# Patient Record
Sex: Female | Born: 1965 | Race: White | Hispanic: No
Health system: Southern US, Academic
[De-identification: ages and names within clinical notes are randomized; demographics above are authoritative.]

## PROBLEM LIST (undated history)

## (undated) ENCOUNTER — Ambulatory Visit

## (undated) ENCOUNTER — Encounter

## (undated) ENCOUNTER — Ambulatory Visit: Payer: Medicaid (Managed Care)

## (undated) ENCOUNTER — Encounter: Attending: Gastroenterology | Primary: Gastroenterology

## (undated) ENCOUNTER — Encounter
Attending: Student in an Organized Health Care Education/Training Program | Primary: Student in an Organized Health Care Education/Training Program

## (undated) ENCOUNTER — Ambulatory Visit: Payer: MEDICAID

## (undated) ENCOUNTER — Encounter: Attending: Podiatrist | Primary: Podiatrist

## (undated) ENCOUNTER — Encounter: Attending: Family | Primary: Family

## (undated) ENCOUNTER — Ambulatory Visit: Attending: Family | Primary: Family

## (undated) ENCOUNTER — Ambulatory Visit: Payer: PRIVATE HEALTH INSURANCE

## (undated) ENCOUNTER — Telehealth: Attending: Family | Primary: Family

## (undated) ENCOUNTER — Encounter: Attending: "Endocrinology | Primary: "Endocrinology

## (undated) ENCOUNTER — Telehealth
Attending: Student in an Organized Health Care Education/Training Program | Primary: Student in an Organized Health Care Education/Training Program

## (undated) ENCOUNTER — Ambulatory Visit: Payer: PRIVATE HEALTH INSURANCE | Attending: "Endocrinology | Primary: "Endocrinology

## (undated) ENCOUNTER — Ambulatory Visit
Payer: PRIVATE HEALTH INSURANCE | Attending: Student in an Organized Health Care Education/Training Program | Primary: Student in an Organized Health Care Education/Training Program

## (undated) ENCOUNTER — Telehealth

## (undated) ENCOUNTER — Encounter: Attending: Internal Medicine | Primary: Internal Medicine

## (undated) ENCOUNTER — Encounter: Attending: Hematology & Oncology | Primary: Hematology & Oncology

## (undated) ENCOUNTER — Ambulatory Visit: Attending: Otolaryngology | Primary: Otolaryngology

## (undated) ENCOUNTER — Ambulatory Visit: Payer: PRIVATE HEALTH INSURANCE | Attending: Family | Primary: Family

## (undated) ENCOUNTER — Ambulatory Visit: Attending: Pharmacist | Primary: Pharmacist

## (undated) ENCOUNTER — Ambulatory Visit: Payer: MEDICAID | Attending: Family | Primary: Family

## (undated) ENCOUNTER — Ambulatory Visit: Payer: PRIVATE HEALTH INSURANCE | Attending: Nurse Practitioner | Primary: Nurse Practitioner

## (undated) ENCOUNTER — Encounter: Attending: Nurse Practitioner | Primary: Nurse Practitioner

## (undated) ENCOUNTER — Other Ambulatory Visit

## (undated) ENCOUNTER — Inpatient Hospital Stay

## (undated) ENCOUNTER — Ambulatory Visit: Payer: PRIVATE HEALTH INSURANCE | Attending: Nephrology | Primary: Nephrology

## (undated) ENCOUNTER — Telehealth: Attending: Nurse Practitioner | Primary: Nurse Practitioner

## (undated) ENCOUNTER — Ambulatory Visit
Payer: Medicaid (Managed Care) | Attending: Student in an Organized Health Care Education/Training Program | Primary: Student in an Organized Health Care Education/Training Program

## (undated) ENCOUNTER — Ambulatory Visit: Payer: MEDICAID | Attending: Registered" | Primary: Registered"

## (undated) ENCOUNTER — Ambulatory Visit: Payer: PRIVATE HEALTH INSURANCE | Attending: Registered" | Primary: Registered"

## (undated) ENCOUNTER — Ambulatory Visit: Attending: Orthopaedic Surgery | Primary: Orthopaedic Surgery

## (undated) ENCOUNTER — Encounter: Attending: Diagnostic Radiology | Primary: Diagnostic Radiology

## (undated) ENCOUNTER — Ambulatory Visit: Payer: Medicaid (Managed Care) | Attending: Nurse Practitioner | Primary: Nurse Practitioner

## (undated) ENCOUNTER — Ambulatory Visit: Payer: MEDICAID | Attending: Otolaryngology | Primary: Otolaryngology

## (undated) ENCOUNTER — Ambulatory Visit: Payer: MEDICAID | Attending: "Endocrinology | Primary: "Endocrinology

## (undated) ENCOUNTER — Inpatient Hospital Stay: Payer: PRIVATE HEALTH INSURANCE

## (undated) ENCOUNTER — Ambulatory Visit: Attending: Podiatrist | Primary: Podiatrist

## (undated) ENCOUNTER — Encounter: Attending: General Acute Care Hospital | Primary: General Acute Care Hospital

## (undated) DIAGNOSIS — D329 Benign neoplasm of meninges, unspecified: Secondary | ICD-10-CM

## (undated) DIAGNOSIS — L409 Psoriasis, unspecified: Secondary | ICD-10-CM

## (undated) DIAGNOSIS — F329 Major depressive disorder, single episode, unspecified: Secondary | ICD-10-CM

## (undated) DIAGNOSIS — N3281 Overactive bladder: Secondary | ICD-10-CM

## (undated) DIAGNOSIS — G8929 Other chronic pain: Secondary | ICD-10-CM

## (undated) DIAGNOSIS — K7460 Unspecified cirrhosis of liver: Secondary | ICD-10-CM

## (undated) DIAGNOSIS — F419 Anxiety disorder, unspecified: Secondary | ICD-10-CM

## (undated) DIAGNOSIS — K219 Gastro-esophageal reflux disease without esophagitis: Secondary | ICD-10-CM

## (undated) DIAGNOSIS — H409 Unspecified glaucoma: Secondary | ICD-10-CM

## (undated) DIAGNOSIS — E119 Type 2 diabetes mellitus without complications: Secondary | ICD-10-CM

## (undated) DIAGNOSIS — I10 Essential (primary) hypertension: Secondary | ICD-10-CM

## (undated) DIAGNOSIS — F32A Depression, unspecified: Secondary | ICD-10-CM

## (undated) DIAGNOSIS — C801 Malignant (primary) neoplasm, unspecified: Secondary | ICD-10-CM

## (undated) DIAGNOSIS — E785 Hyperlipidemia, unspecified: Secondary | ICD-10-CM

## (undated) DIAGNOSIS — M1990 Unspecified osteoarthritis, unspecified site: Secondary | ICD-10-CM

## (undated) DIAGNOSIS — D649 Anemia, unspecified: Secondary | ICD-10-CM

## (undated) HISTORY — DX: Hyperlipidemia, unspecified: E78.5

## (undated) HISTORY — DX: Type 2 diabetes mellitus without complications: E11.9

## (undated) HISTORY — DX: Unspecified osteoarthritis, unspecified site: M19.90

## (undated) HISTORY — DX: Anemia, unspecified: D64.9

## (undated) HISTORY — DX: Psoriasis, unspecified: L40.9

## (undated) HISTORY — DX: Depression, unspecified: F32.A

## (undated) HISTORY — DX: Major depressive disorder, single episode, unspecified: F32.9

## (undated) HISTORY — DX: Benign neoplasm of meninges, unspecified: D32.9

## (undated) HISTORY — DX: Essential (primary) hypertension: I10

## (undated) HISTORY — DX: Anxiety disorder, unspecified: F41.9

## (undated) HISTORY — DX: Unspecified cirrhosis of liver: K74.60

## (undated) HISTORY — DX: Gastro-esophageal reflux disease without esophagitis: K21.9

## (undated) HISTORY — DX: Malignant (primary) neoplasm, unspecified: C80.1

## (undated) HISTORY — DX: Unspecified glaucoma: H40.9

## (undated) HISTORY — DX: Overactive bladder: N32.81

## (undated) HISTORY — DX: Other chronic pain: G89.29

---

## 1998-06-27 ENCOUNTER — Emergency Department (HOSPITAL_COMMUNITY): Admission: EM | Admit: 1998-06-27 | Discharge: 1998-06-27 | Payer: Self-pay | Admitting: Emergency Medicine

## 1998-06-28 ENCOUNTER — Ambulatory Visit (HOSPITAL_COMMUNITY): Admission: RE | Admit: 1998-06-28 | Discharge: 1998-06-28 | Payer: Self-pay | Admitting: Emergency Medicine

## 1998-12-01 HISTORY — PX: CHOLECYSTECTOMY: SHX55

## 2005-12-01 HISTORY — PX: ABDOMINAL HYSTERECTOMY: SHX81

## 2005-12-24 ENCOUNTER — Ambulatory Visit: Payer: Self-pay | Admitting: Unknown Physician Specialty

## 2006-01-08 ENCOUNTER — Ambulatory Visit: Payer: Self-pay | Admitting: Unknown Physician Specialty

## 2007-11-04 ENCOUNTER — Ambulatory Visit: Payer: Self-pay

## 2008-08-10 ENCOUNTER — Ambulatory Visit: Payer: Self-pay | Admitting: Family Medicine

## 2011-01-02 ENCOUNTER — Ambulatory Visit: Payer: Self-pay

## 2011-01-17 ENCOUNTER — Emergency Department: Payer: Self-pay | Admitting: Emergency Medicine

## 2011-03-17 DIAGNOSIS — K625 Hemorrhage of anus and rectum: Secondary | ICD-10-CM | POA: Insufficient documentation

## 2011-06-13 ENCOUNTER — Ambulatory Visit: Payer: Self-pay

## 2012-09-08 ENCOUNTER — Ambulatory Visit: Payer: Self-pay

## 2012-10-04 ENCOUNTER — Ambulatory Visit: Payer: Self-pay | Admitting: Adult Health

## 2012-11-09 ENCOUNTER — Ambulatory Visit: Payer: Self-pay | Admitting: Gynecologic Oncology

## 2012-11-09 LAB — URINALYSIS, COMPLETE
Bilirubin,UR: NEGATIVE
Glucose,UR: NEGATIVE mg/dL (ref 0–75)
Ketone: NEGATIVE
Ph: 5 (ref 4.5–8.0)
Specific Gravity: 1.025 (ref 1.003–1.030)
Squamous Epithelial: 5
Transitional Epi: 1

## 2012-12-01 ENCOUNTER — Ambulatory Visit: Payer: Self-pay | Admitting: Gynecologic Oncology

## 2013-01-29 ENCOUNTER — Ambulatory Visit: Payer: Self-pay | Admitting: Gynecologic Oncology

## 2013-02-14 ENCOUNTER — Ambulatory Visit: Payer: Self-pay

## 2013-02-17 LAB — HM DIABETES EYE EXAM

## 2013-03-01 ENCOUNTER — Ambulatory Visit: Payer: Self-pay | Admitting: Gynecologic Oncology

## 2013-04-07 DIAGNOSIS — M6289 Other specified disorders of muscle: Secondary | ICD-10-CM | POA: Insufficient documentation

## 2013-04-12 ENCOUNTER — Ambulatory Visit: Payer: Self-pay | Admitting: Gynecologic Oncology

## 2013-06-10 ENCOUNTER — Ambulatory Visit: Payer: Self-pay | Admitting: Adult Health

## 2013-12-01 DIAGNOSIS — L409 Psoriasis, unspecified: Secondary | ICD-10-CM

## 2013-12-01 HISTORY — DX: Psoriasis, unspecified: L40.9

## 2013-12-28 ENCOUNTER — Ambulatory Visit: Payer: Self-pay | Admitting: Family Medicine

## 2014-03-01 LAB — HM DIABETES EYE EXAM

## 2014-08-02 LAB — HM DIABETES EYE EXAM

## 2014-08-10 ENCOUNTER — Ambulatory Visit: Payer: Self-pay | Admitting: Internal Medicine

## 2014-08-14 ENCOUNTER — Ambulatory Visit: Payer: Self-pay

## 2014-10-03 ENCOUNTER — Ambulatory Visit: Payer: Self-pay | Admitting: Nurse Practitioner

## 2015-02-07 LAB — HM DIABETES EYE EXAM

## 2015-02-12 ENCOUNTER — Ambulatory Visit: Payer: Self-pay

## 2015-04-06 IMAGING — MG MAM DGTL UNI LT TOMO W/CAD
8 of 10 series · 8 of 18 positions shown · non-contrast
Comparison: Previous examinations.

CLINICAL DATA: Followup left breast probably benign calcifications.

EXAM:
DIGITAL DIAGNOSTIC LEFT MAMMOGRAM WITH 3D TOMOSYNTHESIS AND CAD

[L ML (1 of 2)]
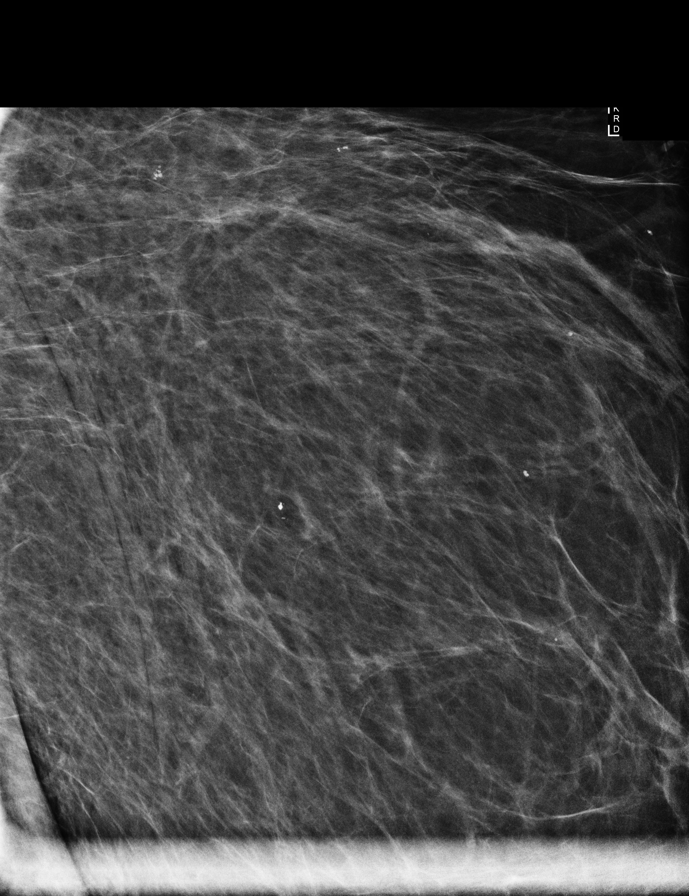

[L CC (1 of 3)]
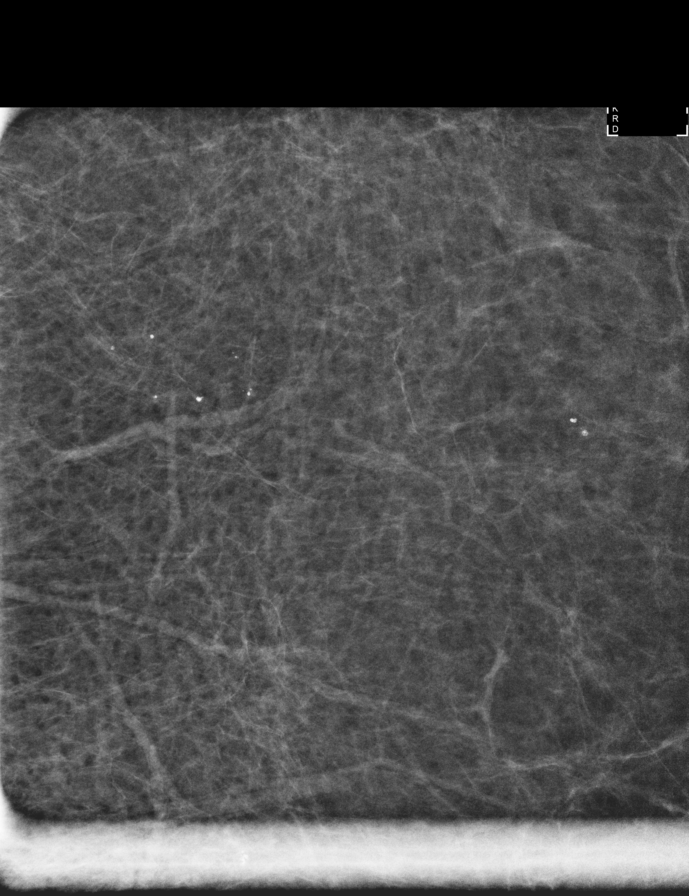

[L ML (2 of 2)]
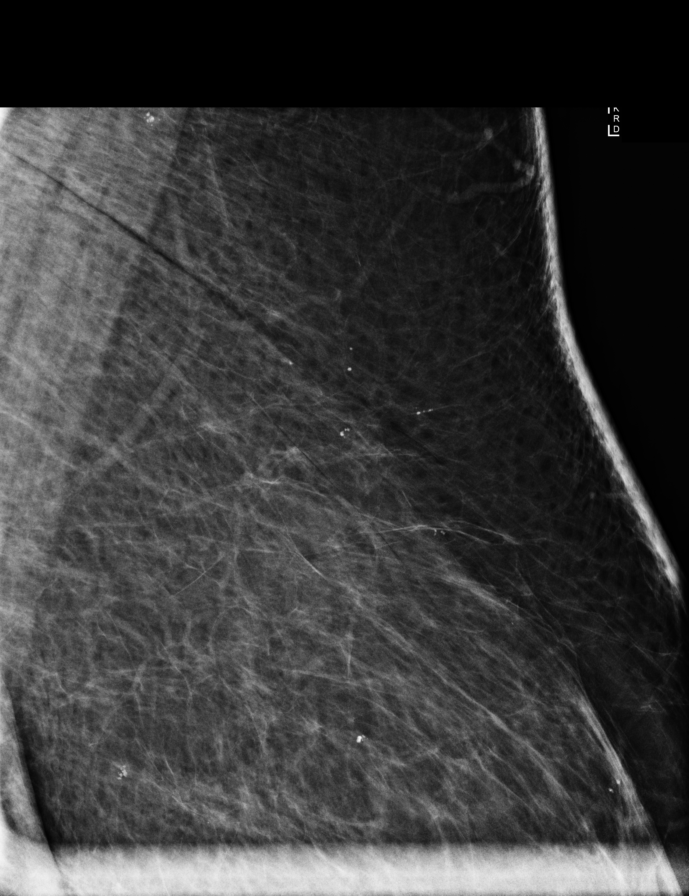

[L CC (2 of 3)]
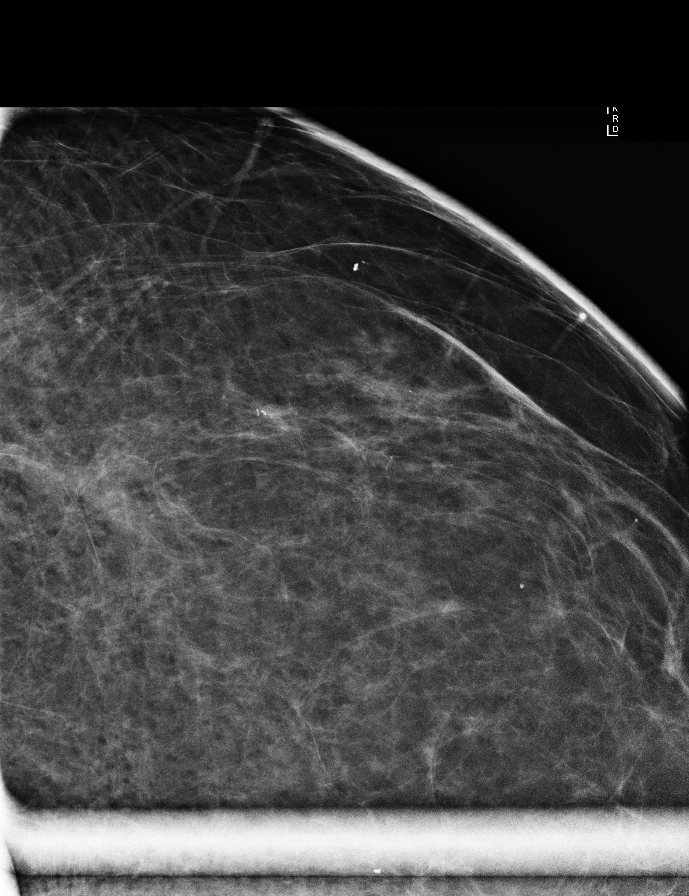

[L CC synth-2D]
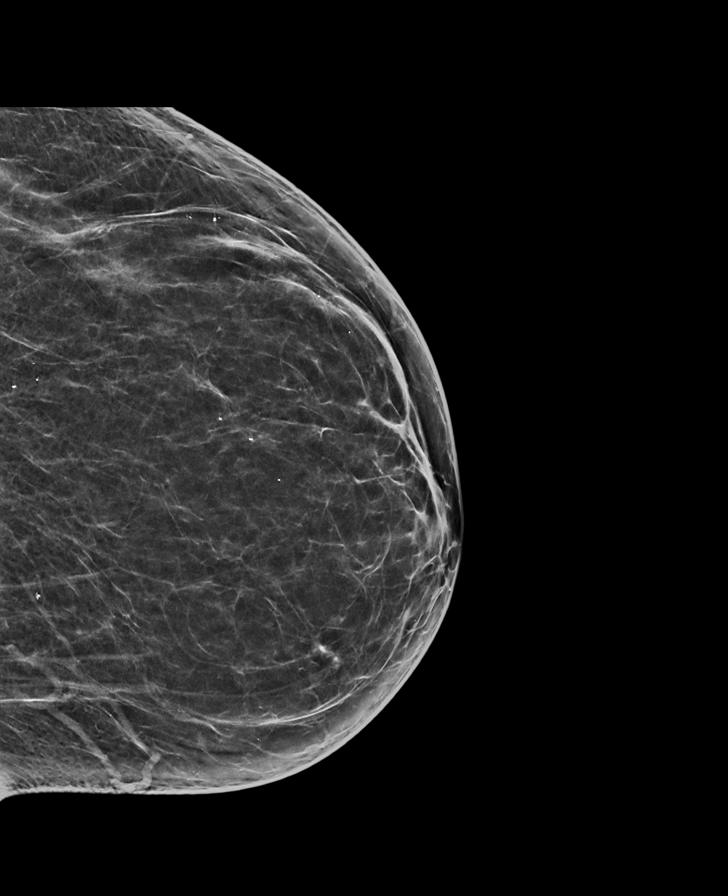

[L MLO synth-2D]
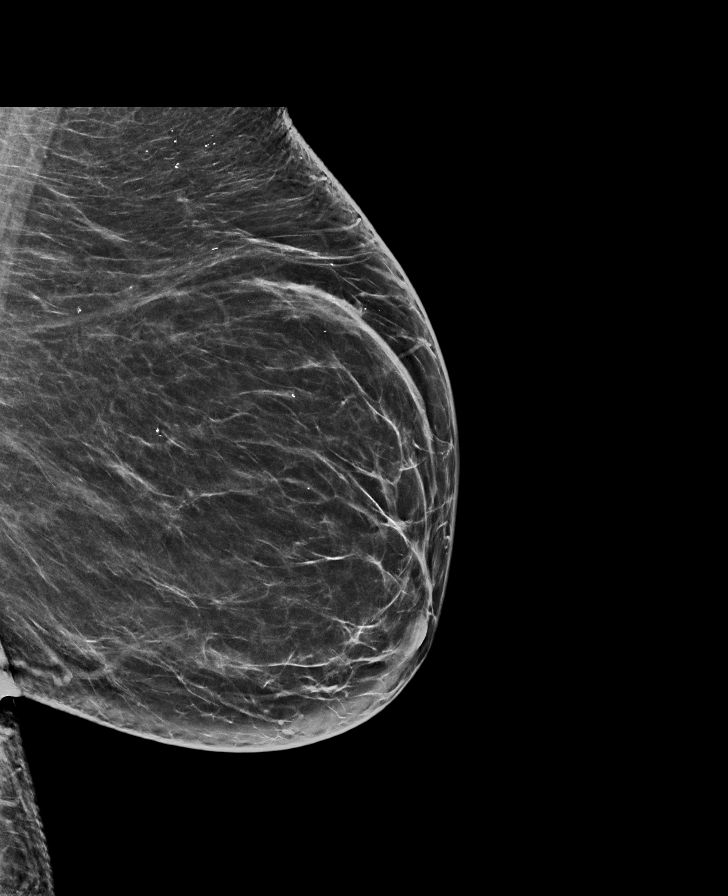

[L CC (3 of 3)]
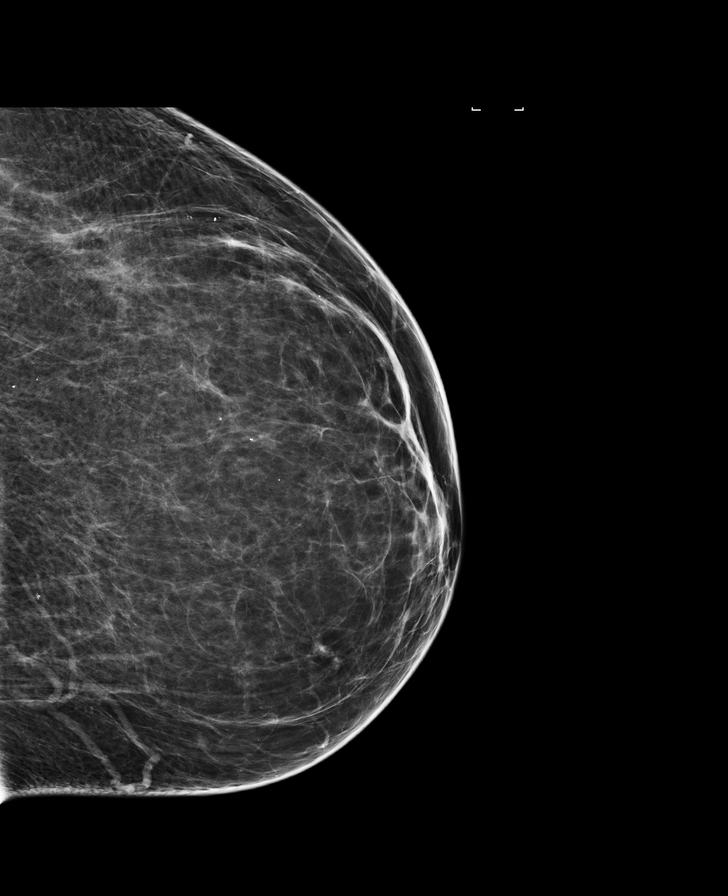

[L MLO]
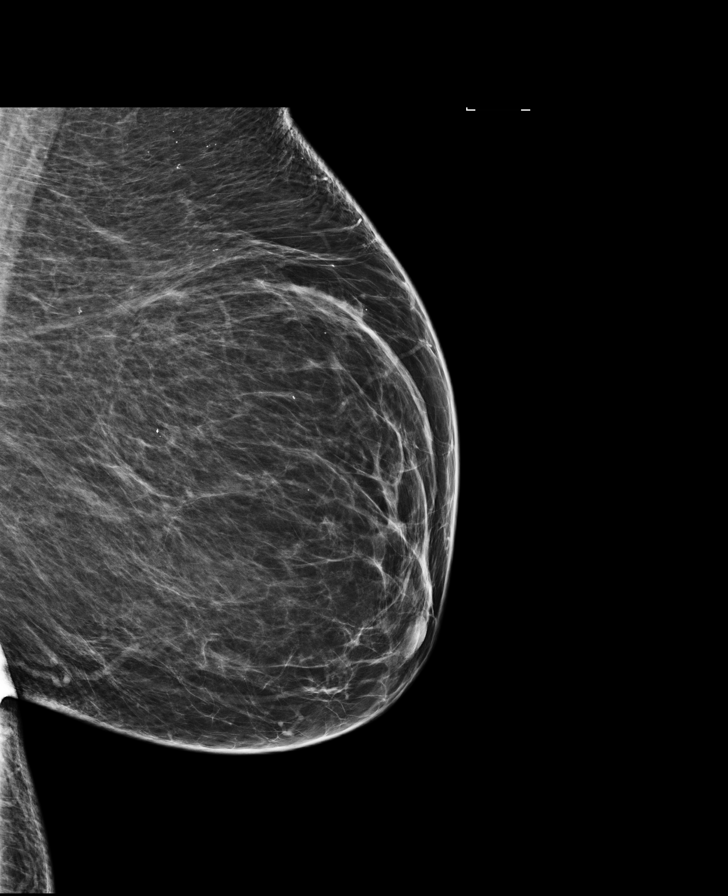

[8 of 18 positions shown; findings below may reference images not displayed]

ACR Breast Density Category b: There are scattered areas of
fibroglandular density.
FINDINGS: The previously demonstrated scattered calcifications in the left
breast are smooth and rounded and oval in configuration, with
typically benign features today. No findings suspicious for
malignancy.

Mammographic images were processed with CAD.
IMPRESSION: Benign left breast calcifications.  No evidence of malignancy.

RECOMMENDATION:
Bilateral screening mammogram in 6 months. That will be 1 year since
mammographic evaluation of the right breast.

I have discussed the findings and recommendations with the patient.
Results were also provided in writing at the conclusion of the
visit. If applicable, a reminder letter will be sent to the patient
regarding the next appointment.

BI-RADS CATEGORY  2: Benign.

## 2015-04-24 ENCOUNTER — Encounter (INDEPENDENT_AMBULATORY_CARE_PROVIDER_SITE_OTHER): Payer: Self-pay

## 2015-05-16 ENCOUNTER — Other Ambulatory Visit: Payer: Self-pay

## 2015-05-23 ENCOUNTER — Ambulatory Visit: Payer: Self-pay | Admitting: Internal Medicine

## 2015-05-23 DIAGNOSIS — K219 Gastro-esophageal reflux disease without esophagitis: Secondary | ICD-10-CM | POA: Insufficient documentation

## 2015-05-23 DIAGNOSIS — I10 Essential (primary) hypertension: Secondary | ICD-10-CM | POA: Insufficient documentation

## 2015-07-30 ENCOUNTER — Encounter: Payer: Self-pay | Admitting: Pharmacist

## 2015-07-30 ENCOUNTER — Encounter (INDEPENDENT_AMBULATORY_CARE_PROVIDER_SITE_OTHER): Payer: Self-pay

## 2015-11-06 ENCOUNTER — Encounter: Payer: Self-pay | Admitting: Pharmacist

## 2015-11-14 ENCOUNTER — Encounter (INDEPENDENT_AMBULATORY_CARE_PROVIDER_SITE_OTHER): Payer: Self-pay

## 2015-12-05 ENCOUNTER — Other Ambulatory Visit: Payer: Self-pay

## 2015-12-06 ENCOUNTER — Other Ambulatory Visit: Payer: Self-pay

## 2015-12-06 LAB — HEPATIC FUNCTION PANEL
ALT: 25 U/L (ref 7–35)
AST: 34 U/L (ref 13–35)
Alkaline Phosphatase: 72 U/L (ref 25–125)
BILIRUBIN, TOTAL: 1.1 mg/dL

## 2015-12-06 LAB — LIPID PANEL
Cholesterol: 159 mg/dL (ref 0–200)
HDL: 57 mg/dL (ref 35–70)
LDL Cholesterol: 75 mg/dL
Triglycerides: 134 mg/dL (ref 40–160)

## 2015-12-06 LAB — CBC AND DIFFERENTIAL
HCT: 39 % (ref 36–46)
Hemoglobin: 13 g/dL (ref 12.0–16.0)
Neutrophils Absolute: 4 /uL
Platelets: 212 10*3/uL (ref 150–399)
WBC: 7 10*3/mL

## 2015-12-06 LAB — BASIC METABOLIC PANEL
BUN: 7 mg/dL (ref 4–21)
CREATININE: 0.6 mg/dL (ref 0.5–1.1)
GLUCOSE: 107 mg/dL
POTASSIUM: 3.4 mmol/L (ref 3.4–5.3)
SODIUM: 143 mmol/L (ref 137–147)

## 2015-12-06 LAB — HEMOGLOBIN A1C: Hemoglobin A1C: 5.6

## 2015-12-12 ENCOUNTER — Ambulatory Visit: Payer: Self-pay | Admitting: Internal Medicine

## 2015-12-12 DIAGNOSIS — E119 Type 2 diabetes mellitus without complications: Secondary | ICD-10-CM | POA: Insufficient documentation

## 2015-12-12 DIAGNOSIS — E876 Hypokalemia: Secondary | ICD-10-CM | POA: Insufficient documentation

## 2015-12-12 DIAGNOSIS — Z794 Long term (current) use of insulin: Secondary | ICD-10-CM | POA: Insufficient documentation

## 2015-12-12 DIAGNOSIS — E785 Hyperlipidemia, unspecified: Secondary | ICD-10-CM | POA: Insufficient documentation

## 2015-12-12 DIAGNOSIS — R8290 Unspecified abnormal findings in urine: Secondary | ICD-10-CM | POA: Insufficient documentation

## 2016-01-28 DIAGNOSIS — R8290 Unspecified abnormal findings in urine: Secondary | ICD-10-CM

## 2016-01-28 DIAGNOSIS — E785 Hyperlipidemia, unspecified: Secondary | ICD-10-CM

## 2016-01-28 DIAGNOSIS — E876 Hypokalemia: Secondary | ICD-10-CM

## 2016-01-28 DIAGNOSIS — I10 Essential (primary) hypertension: Secondary | ICD-10-CM

## 2016-01-28 DIAGNOSIS — E119 Type 2 diabetes mellitus without complications: Secondary | ICD-10-CM

## 2016-01-28 DIAGNOSIS — E538 Deficiency of other specified B group vitamins: Secondary | ICD-10-CM

## 2016-01-28 DIAGNOSIS — K219 Gastro-esophageal reflux disease without esophagitis: Secondary | ICD-10-CM

## 2016-01-28 DIAGNOSIS — Z794 Long term (current) use of insulin: Principal | ICD-10-CM

## 2016-02-19 ENCOUNTER — Other Ambulatory Visit: Payer: Self-pay | Admitting: Nurse Practitioner

## 2016-03-12 ENCOUNTER — Other Ambulatory Visit: Payer: Self-pay

## 2016-03-12 DIAGNOSIS — E119 Type 2 diabetes mellitus without complications: Secondary | ICD-10-CM

## 2016-03-12 DIAGNOSIS — E538 Deficiency of other specified B group vitamins: Secondary | ICD-10-CM

## 2016-03-19 ENCOUNTER — Encounter: Payer: Self-pay | Admitting: Internal Medicine

## 2016-03-19 ENCOUNTER — Ambulatory Visit: Payer: Self-pay | Admitting: Internal Medicine

## 2016-03-19 VITALS — BP 117/77 | HR 74 | Temp 98.2°F | Wt 267.0 lb

## 2016-03-19 DIAGNOSIS — E119 Type 2 diabetes mellitus without complications: Secondary | ICD-10-CM

## 2016-03-19 DIAGNOSIS — E785 Hyperlipidemia, unspecified: Secondary | ICD-10-CM

## 2016-03-19 DIAGNOSIS — E538 Deficiency of other specified B group vitamins: Secondary | ICD-10-CM

## 2016-03-19 DIAGNOSIS — Z794 Long term (current) use of insulin: Principal | ICD-10-CM

## 2016-03-19 DIAGNOSIS — I10 Essential (primary) hypertension: Secondary | ICD-10-CM

## 2016-03-19 LAB — GLUCOSE, POCT (MANUAL RESULT ENTRY): POC GLUCOSE: 147 mg/dL — AB (ref 70–99)

## 2016-03-19 NOTE — Assessment & Plan Note (Signed)
Stable

## 2016-03-19 NOTE — Assessment & Plan Note (Signed)
A1c was not captured for last labs.

## 2016-03-19 NOTE — Progress Notes (Signed)
Subjective:    Patient ID: Abigail Molina, female    DOB: 21-May-1966, 50 y.o.   MRN: [id number]  HPI  Patient Active Problem List   Diagnosis Date Noted  . Diabetes ([place]) 12/12/2015  . Hyperlipidemia 12/12/2015  . Low vitamin B12 level 12/12/2015  . Urine findings abnormal 12/12/2015  . Potassium serum decreased 12/12/2015  . Hypertension 05/23/2015  . Acid reflux 05/23/2015   Pt presents with a f/u for diabetes. Blood glucose this morning was 147. Pt did not eat breakfast.   BP was good this morning -117/77.  Pt is having pain in both hands and losing strength in the hands.   Pt is having left ear pain (inside and by the jaw). Pt claims it "feels like it is stuffed with cotton."  Review of Systems  HENT: Positive for ear pain.   Musculoskeletal: Arthralgias: pain in both hands.       Objective:   Physical Exam  Constitutional: She is oriented to person, place, and time.  Cardiovascular: Normal rate, regular rhythm and normal heart sounds.   Pulmonary/Chest: Effort normal and breath sounds normal.  Neurological: She is alert and oriented to person, place, and time.       Medication List       This list is accurate as of: 03/19/16 10:21 AM.  Always use your most recent med list.               aspirin 81 MG tablet  Take 81 mg by mouth daily.     citalopram 40 MG tablet  Commonly known as:  CELEXA  Take 60 mg by mouth daily.     insulin glargine 100 UNIT/ML injection  Commonly known as:  LANTUS  Inject 42 Units into the skin at bedtime. Inject 42 units once daily.     lisinopril 40 MG tablet  Commonly known as:  PRINIVIL,ZESTRIL  Take 40 mg by mouth daily. Take one tablet by mouth once a day. Replaces accupril.     metFORMIN 1000 MG tablet  Commonly known as:  GLUCOPHAGE  Take 1,000 mg by mouth 2 (two) times daily with a meal.     mometasone 0.1 % ointment  Commonly known as:  ELOCON  Apply 1 application topically daily.     NOVOFINE 32G X 6 MM Misc   Generic drug:  Insulin Pen Needle  1 Syringe by Does not apply route. Use with Victoza.     PROTONIX 20 MG tablet  Generic drug:  pantoprazole  Take 20 mg by mouth daily. Reported on 03/19/2016     REXULTI 2 MG Tabs  Generic drug:  Brexpiprazole  Take 2 mg by mouth.     risperidone 4 MG tablet  Commonly known as:  RISPERDAL  Take 4 mg by mouth 2 (two) times daily. Reported on 03/19/2016     simvastatin 20 MG tablet  Commonly known as:  ZOCOR  TAKE ONE TABLET BY MOUTH EVERY EVENING     traZODone 100 MG tablet  Commonly known as:  DESYREL  Take 100 mg by mouth at bedtime. Reported on 03/19/2016     VICTOZA [place]  Inject 1 mg into the skin daily. Inject 1-2 mg once daily       BP 117/77 mmHg  Pulse 74  Temp(Src) 98.2 F (36.8 C)  Wt 267 lb (121.11 kg)     Assessment & Plan:  Pt has good circulation in the hands - not a circulatory problem. May be  related to arthritis. Pt advised to exercise hands gently.   Left eardrum looks clear, no significant wax in left ear. Right ear looks clear too. 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 May have been strained and inflamed.   Low vitamin B12 level In normal range from past lab results.  Hypertension Stable.  Diabetes ([place]) A1c was not captured for last labs.    MD f/u: 3month Met c, cbc, a1c, lipid,

## 2016-03-19 NOTE — Assessment & Plan Note (Signed)
In normal range from past lab results.

## 2016-03-20 LAB — HEMOGLOBIN A1C
Est. average glucose Bld gHb Est-mCnc: 160 mg/dL
HEMOGLOBIN A1C: 7.2 % — AB (ref 4.8–5.6)

## 2016-03-22 LAB — COMPREHENSIVE METABOLIC PANEL
A/G RATIO: 1.4 (ref 1.2–2.2)
ALT: 14 IU/L (ref 0–32)
AST: 29 IU/L (ref 0–40)
Albumin: 4.2 g/dL (ref 3.5–5.5)
Alkaline Phosphatase: 80 IU/L (ref 39–117)
BUN / CREAT RATIO: 14 (ref 9–23)
BUN: 10 mg/dL (ref 6–24)
Bilirubin Total: 0.7 mg/dL (ref 0.0–1.2)
CALCIUM: 9.6 mg/dL (ref 8.7–10.2)
CO2: 21 mmol/L (ref 18–29)
Chloride: 101 mmol/L (ref 96–106)
Creatinine, Ser: 0.69 mg/dL (ref 0.57–1.00)
GFR calc Af Amer: 117 mL/min/{1.73_m2} (ref >59)
GFR, EST NON AFRICAN AMERICAN: 101 mL/min/{1.73_m2} (ref >59)
GLOBULIN, TOTAL: 2.9 g/dL (ref 1.5–4.5)
Glucose: 145 mg/dL — ABNORMAL HIGH (ref 65–99)
POTASSIUM: 4.7 mmol/L (ref 3.5–5.2)
SODIUM: 143 mmol/L (ref 134–144)
Total Protein: 7.1 g/dL (ref 6.0–8.5)

## 2016-03-22 LAB — UA/M W/RFLX CULTURE, COMP
Bilirubin, UA: NEGATIVE
GLUCOSE, UA: NEGATIVE
Ketones, UA: NEGATIVE
NITRITE UA: NEGATIVE
PH UA: 7 (ref 5.0–7.5)
PROTEIN UA: NEGATIVE
RBC UA: NEGATIVE
Specific Gravity, UA: 1.006 (ref 1.005–1.030)
UUROB: 1 mg/dL (ref 0.2–1.0)

## 2016-03-22 LAB — MICROSCOPIC EXAMINATION
BACTERIA UA: NONE SEEN
Casts: NONE SEEN /lpf
RBC, UA: NONE SEEN /hpf (ref 0–?)

## 2016-03-22 LAB — FOLATE: FOLATE: 5.7 ng/mL (ref >3.0)

## 2016-03-22 LAB — URINE CULTURE, COMPREHENSIVE

## 2016-03-22 LAB — CBC WITH DIFFERENTIAL
Hematocrit: 39.4 % (ref 34.0–46.6)
Hemoglobin: 13 g/dL (ref 11.1–15.9)
MCH: 27.6 pg (ref 26.6–33.0)
MCHC: 33 g/dL (ref 31.5–35.7)
MCV: 84 fL (ref 79–97)
RBC: 4.71 x10E6/uL (ref 3.77–5.28)
RDW: 13.1 % (ref 12.3–15.4)
WBC: 6.1 10*3/uL (ref 3.4–10.8)

## 2016-03-22 LAB — VITAMIN B12: Vitamin B-12: 564 pg/mL (ref 211–946)

## 2016-04-04 ENCOUNTER — Other Ambulatory Visit: Payer: Self-pay | Admitting: Nurse Practitioner

## 2016-04-10 ENCOUNTER — Telehealth: Payer: Self-pay | Admitting: Urology

## 2016-04-10 NOTE — Telephone Encounter (Signed)
Pt left message. Wants to schedule 1 md apt and 1 eye apt. Return call to schedule.

## 2016-04-17 NOTE — Telephone Encounter (Signed)
Pt left message again about needing eye apt and MD apt. Tried to return call on 5/18 but no answer.

## 2016-04-18 ENCOUNTER — Other Ambulatory Visit: Payer: Self-pay | Admitting: Nurse Practitioner

## 2016-05-15 ENCOUNTER — Other Ambulatory Visit: Payer: Self-pay | Admitting: Urology

## 2016-05-15 ENCOUNTER — Encounter: Payer: Self-pay | Admitting: Pharmacist

## 2016-06-11 ENCOUNTER — Other Ambulatory Visit: Payer: Self-pay

## 2016-06-11 DIAGNOSIS — E785 Hyperlipidemia, unspecified: Secondary | ICD-10-CM

## 2016-06-11 DIAGNOSIS — Z794 Long term (current) use of insulin: Principal | ICD-10-CM

## 2016-06-11 DIAGNOSIS — E119 Type 2 diabetes mellitus without complications: Secondary | ICD-10-CM

## 2016-06-12 LAB — COMPREHENSIVE METABOLIC PANEL
A/G RATIO: 1.2 (ref 1.2–2.2)
ALBUMIN: 4.2 g/dL (ref 3.5–5.5)
ALT: 22 IU/L (ref 0–32)
AST: 39 IU/L (ref 0–40)
Alkaline Phosphatase: 74 IU/L (ref 39–117)
BUN / CREAT RATIO: 11 (ref 9–23)
BUN: 7 mg/dL (ref 6–24)
Bilirubin Total: 1.1 mg/dL (ref 0.0–1.2)
CALCIUM: 10.2 mg/dL (ref 8.7–10.2)
CO2: 19 mmol/L (ref 18–29)
Chloride: 100 mmol/L (ref 96–106)
Creatinine, Ser: 0.64 mg/dL (ref 0.57–1.00)
GFR, EST AFRICAN AMERICAN: 119 mL/min/{1.73_m2} (ref >59)
GFR, EST NON AFRICAN AMERICAN: 104 mL/min/{1.73_m2} (ref >59)
GLOBULIN, TOTAL: 3.4 g/dL (ref 1.5–4.5)
Glucose: 157 mg/dL — ABNORMAL HIGH (ref 65–99)
POTASSIUM: 4.6 mmol/L (ref 3.5–5.2)
Sodium: 142 mmol/L (ref 134–144)
TOTAL PROTEIN: 7.6 g/dL (ref 6.0–8.5)

## 2016-06-12 LAB — LIPID PANEL
CHOL/HDL RATIO: 2.7 ratio (ref 0.0–4.4)
Cholesterol, Total: 147 mg/dL (ref 100–199)
HDL: 54 mg/dL (ref >39)
LDL CALC: 69 mg/dL (ref 0–99)
TRIGLYCERIDES: 121 mg/dL (ref 0–149)
VLDL Cholesterol Cal: 24 mg/dL (ref 5–40)

## 2016-06-12 LAB — CBC WITH DIFFERENTIAL/PLATELET
BASOS: 0 %
Basophils Absolute: 0 10*3/uL (ref 0.0–0.2)
EOS (ABSOLUTE): 0.2 10*3/uL (ref 0.0–0.4)
Eos: 4 %
HEMOGLOBIN: 12.9 g/dL (ref 11.1–15.9)
Hematocrit: 39.2 % (ref 34.0–46.6)
Immature Grans (Abs): 0 10*3/uL (ref 0.0–0.1)
Immature Granulocytes: 0 %
LYMPHS: 29 %
Lymphocytes Absolute: 1.9 10*3/uL (ref 0.7–3.1)
MCH: 26.5 pg — AB (ref 26.6–33.0)
MCHC: 32.9 g/dL (ref 31.5–35.7)
MCV: 81 fL (ref 79–97)
Monocytes Absolute: 0.5 10*3/uL (ref 0.1–0.9)
Monocytes: 7 %
NEUTROS ABS: 3.9 10*3/uL (ref 1.4–7.0)
Neutrophils: 60 %
PLATELETS: 257 10*3/uL (ref 150–379)
RBC: 4.87 x10E6/uL (ref 3.77–5.28)
RDW: 15 % (ref 12.3–15.4)
WBC: 6.6 10*3/uL (ref 3.4–10.8)

## 2016-06-12 LAB — HEMOGLOBIN A1C
Est. average glucose Bld gHb Est-mCnc: 117 mg/dL
Hgb A1c MFr Bld: 5.7 % — ABNORMAL HIGH (ref 4.8–5.6)

## 2016-06-18 ENCOUNTER — Ambulatory Visit: Payer: Self-pay | Admitting: Internal Medicine

## 2016-07-02 ENCOUNTER — Ambulatory Visit: Payer: Self-pay | Admitting: Internal Medicine

## 2016-07-02 ENCOUNTER — Encounter: Payer: Self-pay | Admitting: Internal Medicine

## 2016-07-02 VITALS — BP 110/70 | HR 75 | Temp 98.1°F | Wt 274.0 lb

## 2016-07-02 DIAGNOSIS — Z794 Long term (current) use of insulin: Principal | ICD-10-CM

## 2016-07-02 DIAGNOSIS — E119 Type 2 diabetes mellitus without complications: Secondary | ICD-10-CM

## 2016-07-02 LAB — GLUCOSE, POCT (MANUAL RESULT ENTRY): POC Glucose: 182 mg/dl — AB (ref 70–99)

## 2016-07-02 MED ORDER — VICTOZA 18 MG/3ML ~~LOC~~ SOPN: PEN_INJECTOR | SUBCUTANEOUS | 2 refills | Status: DC

## 2016-07-02 MED ORDER — METFORMIN HCL 1000 MG PO TABS: 1000.0000 mg | ORAL_TABLET | Freq: Two times a day (BID) | ORAL | 3 refills | Status: DC

## 2016-07-02 NOTE — Progress Notes (Signed)
   Subjective:    Patient ID: Abigail Molina, female    DOB: 10-Jun-1966, 50 y.o.   MRN: [id number]  HPI  Pt. F/u for diabetes  Blood glucose elevated to 182 A1c decreased to 5.7 from 7.2 Pt. Reports pain in L ear BP is at target   Patient Active Problem List   Diagnosis Date Noted  . Diabetes ([place]) 12/12/2015  . Hyperlipidemia 12/12/2015  . Low vitamin B12 level 12/12/2015  . Urine findings abnormal 12/12/2015  . Potassium serum decreased 12/12/2015  . Hypertension 05/23/2015  . Acid reflux 05/23/2015     Medication List       Accurate as of 07/02/16 11:14 AM. Always use your most recent med list.          aspirin 81 MG tablet Take 81 mg by mouth daily.   citalopram 40 MG tablet Commonly known as:  CELEXA Take 60 mg by mouth daily.   insulin glargine 100 UNIT/ML injection Commonly known as:  LANTUS Inject 42 Units into the skin at bedtime. Inject 42 units once daily.   lisinopril 40 MG tablet Commonly known as:  PRINIVIL,ZESTRIL Take 40 mg by mouth daily. Take one tablet by mouth once a day. Replaces accupril.   metFORMIN 1000 MG tablet Commonly known as:  GLUCOPHAGE Take 1,000 mg by mouth 2 (two) times daily with a meal.   mometasone 0.1 % ointment Commonly known as:  ELOCON Apply 1 application topically daily.   NOVOFINE 32G X 6 MM Misc Generic drug:  Insulin Pen Needle 1 Syringe by Does not apply route. Use with Victoza.   PROTONIX 20 MG tablet Generic drug:  pantoprazole Take 20 mg by mouth daily. Reported on 03/19/2016   pantoprazole 40 MG tablet Commonly known as:  PROTONIX TAKE 1 TABLET ('40MG'$ ) BY MOUTH ONCE DAILY. REPLACES [place].   REXULTI 2 MG Tabs Generic drug:  Brexpiprazole Take 2 mg by mouth.   risperidone 4 MG tablet Commonly known as:  RISPERDAL Take 4 mg by mouth 2 (two) times daily. Reported on 03/19/2016   simvastatin 20 MG tablet Commonly known as:  ZOCOR TAKE ONE TABLET BY MOUTH EVERY EVENING   traZODone 100 MG  tablet Commonly known as:  DESYREL Take 100 mg by mouth at bedtime. Reported on 03/19/2016   VICTOZA [place] Inject 1.2 mg into the skin daily. Inject 1-2 mg once daily        Review of Systems  Left ear pain: occluded w/ ear wax Right ear clear Throat clear    Objective:   Physical Exam  Constitutional: She is oriented to person, place, and time.  Cardiovascular: Normal rate and regular rhythm.   Pulmonary/Chest: Effort normal and breath sounds normal.  Neurological: She is alert and oriented to person, place, and time.    BP 110/70   Pulse 75   Temp 98.1 F (36.7 C)   Wt 274 lb (124.3 kg)        Assessment & Plan:  Pt. Encouraged to use ear wash kit to clear ear wax  Follow up in 3 months w/ labs: A1C, Met C, CBC, Lipid, micro albumin, Hep C immunity screen

## 2016-07-02 NOTE — Patient Instructions (Addendum)
Follow up in 3 months with labs: A1C, Met C, CBC, Lipid, Micro albumin, Hep C immunity screen

## 2016-07-04 ENCOUNTER — Other Ambulatory Visit: Payer: Self-pay | Admitting: Internal Medicine

## 2016-08-06 ENCOUNTER — Other Ambulatory Visit: Payer: Self-pay | Admitting: Nurse Practitioner

## 2016-08-06 DIAGNOSIS — K2100 Gastro-esophageal reflux disease with esophagitis, without bleeding: Secondary | ICD-10-CM

## 2016-08-06 DIAGNOSIS — E785 Hyperlipidemia, unspecified: Secondary | ICD-10-CM

## 2016-10-02 ENCOUNTER — Other Ambulatory Visit: Payer: Self-pay

## 2016-10-02 DIAGNOSIS — E119 Type 2 diabetes mellitus without complications: Secondary | ICD-10-CM

## 2016-10-02 DIAGNOSIS — Z794 Long term (current) use of insulin: Principal | ICD-10-CM

## 2016-10-03 LAB — CBC WITH DIFFERENTIAL
BASOS: 0 %
Basophils Absolute: 0 10*3/uL (ref 0.0–0.2)
EOS (ABSOLUTE): 0.1 10*3/uL (ref 0.0–0.4)
EOS: 2 %
HEMATOCRIT: 35.5 % (ref 34.0–46.6)
HEMOGLOBIN: 11.7 g/dL (ref 11.1–15.9)
IMMATURE GRANULOCYTES: 0 %
Immature Grans (Abs): 0 10*3/uL (ref 0.0–0.1)
LYMPHS ABS: 2.5 10*3/uL (ref 0.7–3.1)
Lymphs: 40 %
MCH: 26.8 pg (ref 26.6–33.0)
MCHC: 33 g/dL (ref 31.5–35.7)
MCV: 81 fL (ref 79–97)
MONOCYTES: 6 %
Monocytes Absolute: 0.4 10*3/uL (ref 0.1–0.9)
NEUTROS ABS: 3.2 10*3/uL (ref 1.4–7.0)
Neutrophils: 52 %
RBC: 4.37 x10E6/uL (ref 3.77–5.28)
RDW: 14.1 % (ref 12.3–15.4)
WBC: 6.3 10*3/uL (ref 3.4–10.8)

## 2016-10-03 LAB — LIPID PANEL
CHOLESTEROL TOTAL: 118 mg/dL (ref 100–199)
Chol/HDL Ratio: 2.4 ratio units (ref 0.0–4.4)
HDL: 49 mg/dL (ref >39)
LDL Calculated: 50 mg/dL (ref 0–99)
Triglycerides: 95 mg/dL (ref 0–149)
VLDL CHOLESTEROL CAL: 19 mg/dL (ref 5–40)

## 2016-10-03 LAB — COMPREHENSIVE METABOLIC PANEL
A/G RATIO: 1.6 (ref 1.2–2.2)
ALT: 10 IU/L (ref 0–32)
AST: 24 IU/L (ref 0–40)
Albumin: 4.1 g/dL (ref 3.5–5.5)
Alkaline Phosphatase: 66 IU/L (ref 39–117)
BILIRUBIN TOTAL: 0.9 mg/dL (ref 0.0–1.2)
BUN/Creatinine Ratio: 16 (ref 9–23)
BUN: 10 mg/dL (ref 6–24)
CALCIUM: 9.5 mg/dL (ref 8.7–10.2)
CHLORIDE: 101 mmol/L (ref 96–106)
CO2: 23 mmol/L (ref 18–29)
Creatinine, Ser: 0.63 mg/dL (ref 0.57–1.00)
GFR calc non Af Amer: 104 mL/min/{1.73_m2} (ref >59)
GFR, EST AFRICAN AMERICAN: 120 mL/min/{1.73_m2} (ref >59)
Globulin, Total: 2.6 g/dL (ref 1.5–4.5)
Glucose: 89 mg/dL (ref 65–99)
POTASSIUM: 4 mmol/L (ref 3.5–5.2)
Sodium: 142 mmol/L (ref 134–144)
TOTAL PROTEIN: 6.7 g/dL (ref 6.0–8.5)

## 2016-10-03 LAB — HEMOGLOBIN A1C
ESTIMATED AVERAGE GLUCOSE: 131 mg/dL
Hgb A1c MFr Bld: 6.2 % — ABNORMAL HIGH (ref 4.8–5.6)

## 2016-10-03 LAB — MICROALBUMIN / CREATININE URINE RATIO
Creatinine, Urine: 17 mg/dL
Microalb/Creat Ratio: <17.6 mg/g creat (ref 0.0–30.0)
Microalbumin, Urine: <3 ug/mL

## 2016-10-03 LAB — HEPATITIS C ANTIBODY: Hep C Virus Ab: <0.1 s/co ratio (ref 0.0–0.9)

## 2016-10-15 ENCOUNTER — Ambulatory Visit: Payer: Self-pay | Admitting: Internal Medicine

## 2016-10-15 ENCOUNTER — Encounter: Payer: Self-pay | Admitting: Internal Medicine

## 2016-10-15 VITALS — BP 113/78 | HR 85 | Temp 97.9°F | Wt 257.0 lb

## 2016-10-15 DIAGNOSIS — E119 Type 2 diabetes mellitus without complications: Secondary | ICD-10-CM

## 2016-10-15 DIAGNOSIS — L409 Psoriasis, unspecified: Secondary | ICD-10-CM

## 2016-10-15 LAB — GLUCOSE, POCT (MANUAL RESULT ENTRY): POC Glucose: 125 mg/dl — AB (ref 70–99)

## 2016-10-15 NOTE — Progress Notes (Signed)
   Subjective:    Patient ID: Abigail Molina, female    DOB: 1966-06-04, 50 y.o.   MRN: [id number]  HPI  Pt reports skin on fingers of both hands have developed blisters twice in 1 month. Blisters break and the skin becomes hard and painful.    Patient Active Problem List   Diagnosis Date Noted  . Diabetes ([place]) 12/12/2015  . Hyperlipidemia 12/12/2015  . Low vitamin B12 level 12/12/2015  . Urine findings abnormal 12/12/2015  . Potassium serum decreased 12/12/2015  . Hypertension 05/23/2015  . Acid reflux 05/23/2015   Current Outpatient Prescriptions on File Prior to Visit  Medication Sig Dispense Refill  . aspirin 81 MG tablet Take 81 mg by mouth daily.    . Brexpiprazole (REXULTI) 2 MG TABS Take 2 mg by mouth.    . citalopram (CELEXA) 40 MG tablet Take 60 mg by mouth daily.    . insulin glargine (LANTUS) 100 UNIT/ML injection Inject 42 Units into the skin at bedtime. Inject 42 units once daily.    . Insulin Pen Needle (NOVOFINE) 32G X 6 MM MISC 1 Syringe by Does not apply route. Use with Victoza.    [place] lisinopril (PRINIVIL,ZESTRIL) 40 MG tablet TAKE ONE TABLET BY MOUTH ONCE A DAY. REPLACES ACCUPRIL. 90 tablet 0  . metFORMIN (GLUCOPHAGE) 1000 MG tablet Take 1 tablet (1,000 mg total) by mouth 2 (two) times daily with a meal. 180 tablet 3  . mometasone (ELOCON) 0.1 % ointment Apply 1 application topically daily.    . pantoprazole (PROTONIX) 20 MG tablet Take 20 mg by mouth daily. Reported on 03/19/2016    . simvastatin (ZOCOR) 20 MG tablet TAKE ONE TABLET BY MOUTH EVERY EVENING 90 tablet 1  . VICTOZA 18 MG/3ML SOPN Inject 1.2 mg once daily 36 mL 2  . pantoprazole (PROTONIX) 40 MG tablet TAKE 1 TABLET (40MG) BY MOUTH ONCE DAILY. REPLACES [place]. (Patient not taking: Reported on 10/15/2016) 90 tablet 1  . risperidone (RISPERDAL) 4 MG tablet Take 4 mg by mouth 2 (two) times daily. Reported on 03/19/2016    . traZODone (DESYREL) 100 MG tablet Take 100 mg by mouth at bedtime. Reported on  03/19/2016     No current facility-administered medications on file prior to visit.      Review of Systems     Objective:   Physical Exam  Constitutional: She is oriented to person, place, and time.  Cardiovascular: Normal rate, regular rhythm and normal heart sounds.   Pulmonary/Chest: Effort normal and breath sounds normal.  Neurological: She is alert and oriented to person, place, and time.    BP 113/78   Pulse 85   Temp 97.9 F (36.6 C) (Oral)   Wt 257 lb (116.6 kg)   LMP 10/15/2016 (Within Months)        Assessment & Plan:   Referral to dermatologist for psoriasis and blisters on hands.  F/u in 6 months w/ labs: Met C, CBC, A1c Pt needs new blood sugar meter.

## 2016-10-15 NOTE — Patient Instructions (Addendum)
Referral to dermatologist for psoriasis and blisters on hands.  F/u in 6 months w/ labs Pt needs new blood sugar meter.

## 2016-10-17 ENCOUNTER — Other Ambulatory Visit: Payer: Self-pay | Admitting: Internal Medicine

## 2016-11-11 ENCOUNTER — Telehealth: Payer: Self-pay

## 2016-11-11 NOTE — Telephone Encounter (Signed)
Rec'd PAP application from [facility] for Lantus Vials 42 units.

## 2016-11-12 NOTE — Telephone Encounter (Signed)
Returned PAP to [facility].  Verified all blanks signed.

## 2016-11-19 NOTE — Telephone Encounter (Signed)
PAP submitted to manufacturer today. Lantus Vials insulin, 42 units, pnce day/daily.

## 2016-12-24 ENCOUNTER — Telehealth: Payer: Self-pay

## 2016-12-24 NOTE — Telephone Encounter (Signed)
Referral sent to [facility] dermatology [place]

## 2016-12-29 ENCOUNTER — Ambulatory Visit: Payer: Self-pay | Admitting: Pharmacist

## 2016-12-29 ENCOUNTER — Encounter (INDEPENDENT_AMBULATORY_CARE_PROVIDER_SITE_OTHER): Payer: Self-pay

## 2016-12-29 ENCOUNTER — Encounter: Payer: Self-pay | Admitting: Pharmacist

## 2016-12-29 VITALS — BP 102/70 | Ht 65.0 in | Wt 261.0 lb

## 2016-12-29 DIAGNOSIS — Z79899 Other long term (current) drug therapy: Secondary | ICD-10-CM

## 2016-12-29 NOTE — Patient Instructions (Addendum)
Recommend checking blood sugar 2-3 times per week in the morning and 1 time per week 2 hours after largest meal.  Morning (fasting) goal: 80-130 mg/dl 2 hours post meal goal: less than 180 mg/dl    Hypoglycemia Hypoglycemia is when the sugar (glucose) level in the blood is too low. Symptoms of low blood sugar may include:  Feeling:  Hungry.  Worried or nervous (anxious).  Sweaty and clammy.  Confused.  Dizzy.  Sleepy.  Sick to your stomach (nauseous).  Having:  A fast heartbeat.  A headache.  A change in your vision.  Jerky movements that you cannot control (seizure).  Nightmares.  Tingling or no feeling (numbness) around the mouth, lips, or tongue.  Having trouble with:  Talking.  Paying attention (concentrating).  Moving (coordination).  Sleeping.  Shaking.  Passing out (fainting).  Getting upset easily (irritability). Low blood sugar can happen to people who have diabetes and people who do not have diabetes. Low blood sugar can happen quickly, and it can be an emergency.  Treating Low Blood Sugar  Low blood sugar is often treated by eating or drinking something sugary right away. If you can think clearly and swallow safely, follow the 15:15 rule:  Take 15 grams of a fast-acting carb (carbohydrate). Some fast-acting carbs are:  1 tube of glucose gel.  3 sugar tablets (glucose pills).  6-8 pieces of hard candy.  4 oz (120 mL) of fruit juice.  4 oz (120 mL) of regular (not diet) soda.  Check your blood sugar 15 minutes after you take the carb.  If your blood sugar is still at or below 70 mg/dL (3.9 mmol/L), take 15 grams of a carb again.  If your blood sugar does not go above 70 mg/dL (3.9 mmol/L) after 3 tries, get help right away.  After your blood sugar goes back to normal, eat a meal or a snack within 1 hour. Treating Very Low Blood Sugar  If your blood sugar is at or below 54 mg/dL (3 mmol/L), you have very low blood sugar (severe  hypoglycemia). This is an emergency. Do not wait to see if the symptoms will go away. Get medical help right away. Call your local emergency services (911 in the [place] Do not drive yourself to the hospital. If you have very low blood sugar and you cannot eat or drink, you may need a glucagon shot (injection). A family member or friend should learn how to check your blood sugar and how to give you a glucagon shot. Ask your doctor if you need to have a glucagon shot kit at home. Follow these instructions at home: General instructions  Avoid any diets that cause you to not eat enough food. Talk with your doctor before you start any new diet.  Take over-the-counter and prescription medicines only as told by your doctor.  Limit alcohol to no more than 1 drink per day for nonpregnant women and 2 drinks per day for men. One drink equals 12 oz of beer, 5 oz of wine, or 1 oz of hard liquor.  Keep all follow-up visits as told by your doctor. This is important. If You Have Diabetes:   Make sure you know the symptoms of low blood sugar.  Always keep a source of sugar with you, such as:  Sugar.  Sugar tablets.  Glucose gel.  Fruit juice.  Regular soda (not diet soda).  Milk.  Hard candy.  Honey.  Take your medicines as told.  Follow your exercise  and meal plan.  Eat on time. Do not skip meals.  Follow your sick day plan when you cannot eat or drink normally. Make this plan ahead of time with your doctor.  Check your blood sugar as often as told by your doctor. Always check before and after exercise.  Share your diabetes care plan with:  Your work or school.  People you live with.  Check your pee (urine) for ketones:  When you are sick.  As told by your doctor.  Carry a card or wear jewelry that says you have diabetes. If You Have Low Blood Sugar From Other Causes:   Check your blood sugar as often as told by your doctor.  Follow instructions from your doctor about  what you cannot eat or drink. Contact a doctor if:  You have trouble keeping your blood sugar in your target range.  You have low blood sugar often. Get help right away if:  You still have symptoms after you eat or drink something sugary.  Your blood sugar is at or below 54 mg/dL (3 mmol/L).  You have jerky movements that you cannot control.  You pass out. These symptoms may be an emergency. Do not wait to see if the symptoms will go away. Get medical help right away. Call your local emergency services (911 in the [place] Do not drive yourself to the hospital.  This information is not intended to replace advice given to you by your health care provider. Make sure you discuss any questions you have with your health care provider. Document Released: 02/11/2010 Document Revised: 04/24/2016 Document Reviewed: 12/21/2015 Elsevier Interactive Patient Education  2017 Elsevier Inc.  

## 2016-12-29 NOTE — Progress Notes (Signed)
[facility] Visit Note  Patient: Abigail Molina MRN: [id number] Date of Birth: Nov 26, 1966 PCP: No primary care provider on file.   Abigail Molina 51 y.o. female presents for an annual medication review visit today. She complains of pain in her hands, knees and elbows; pain score of 5/10. [facility] has made a referral to [facility] dermatology in [place] regarding her Psoriasis. She has not had a recent eye exam.  BP 102/70 (BP Location: Right Arm, Patient Position: Sitting, Cuff Size: Normal)   Ht 5\' 5"  (1.651 m)   Wt 261 lb (118.4 kg)   LMP 10/15/2016 (Within Months)   BMI 43.43 kg/m   Patient Information   Past Medical History:  Diagnosis Date  . Cancer [facility])    Endometrial cancer  . Depression   . Diabetes mellitus without complication ([place])   . GERD (gastroesophageal reflux disease)   . Hyperlipidemia   . Hypertension   . Psoriasis 2015      Past Surgical History:  Procedure Laterality Date  . ABDOMINAL HYSTERECTOMY  2007  . CHOLECYSTECTOMY  2000     Family History  Problem Relation Age of Onset  . Diabetes Mother   . Hypertension Mother   . Depression Mother     New Diagnoses (since last visit):   Family Support: Good  Lifestyle Diet: Breakfast: Skips  Lunch: Occasionally sandwich and fruit  Dinner: Meat, side dish, green beans, potatoes, tomatoes  Drinks: Water, Diet Dr. Malachi Bonds    Current Exercise Habits: The patient does not participate in regular exercise at present  Exercise limited by: None identified    History  Alcohol Use  . Yes    Comment: Rarely.      History  Smoking Status  . Never Smoker  Smokeless Tobacco  . Never Used     Health Maintenance  Topic Date Due  . PNEUMOCOCCAL POLYSACCHARIDE VACCINE (1) 11/01/1966  . FOOT EXAM  11/01/1974  . OPHTHALMOLOGY EXAM  11/01/1974  . HIV Screening  11/02/1979  . TETANUS/TDAP  11/02/1983  . PAP SMEAR  11/01/1985  . MAMMOGRAM  11/01/2014  . COLONOSCOPY   11/01/2014  . HEMOGLOBIN A1C  04/01/2017  . INFLUENZA VACCINE  Completed  . Hepatitis C Screening  Completed   Prior to Admission medications   Medication Sig Start Date End Date Taking? Authorizing Provider  acetaminophen (TYLENOL) 325 MG tablet Take 650 mg by mouth as needed for headache.   Yes Historical Provider, MD  aspirin 81 MG tablet Take 81 mg by mouth daily.   Yes Historical Provider, MD  Brexpiprazole (REXULTI) 2 MG TABS Take 2 mg by mouth.   Yes Historical Provider, MD  cholecalciferol (VITAMIN D) 1000 units tablet Take 1,000 Units by mouth daily.   Yes Historical Provider, MD  citalopram (CELEXA) 40 MG tablet Take 60 mg by mouth daily.   Yes Historical Provider, MD  insulin glargine (LANTUS) 100 UNIT/ML injection Inject 42 Units into the skin at bedtime. Inject 42 units once daily.   Yes Historical Provider, MD  Insulin Pen Needle (NOVOFINE) 32G X 6 MM MISC 1 Syringe by Does not apply route. Use with Victoza.   Yes Historical Provider, MD  lisinopril (PRINIVIL,ZESTRIL) 40 MG tablet TAKE ONE TABLET BY MOUTH ONCE A DAY. REPLACES ACCUPRIL. 10/20/16  Yes Tawni Millers, MD  metFORMIN (GLUCOPHAGE) 1000 MG tablet Take 1 tablet (1,000 mg total) by mouth 2 (two) times daily with a meal. 07/02/16  Yes Tawni Millers, MD  mometasone (ELOCON) 0.1 % ointment Apply topically daily. Apply as needed for psoriasis   Yes Historical Provider, MD  naproxen (NAPROSYN) 250 MG tablet Take 500 mg by mouth 2 (two) times daily with a meal. Once to twice daily   Yes Historical Provider, MD  pantoprazole (PROTONIX) 40 MG tablet TAKE 1 TABLET (40MG ) BY MOUTH ONCE DAILY. REPLACES [place]. 08/13/16  Yes Tawni Millers, MD  simvastatin (ZOCOR) 20 MG tablet TAKE ONE TABLET BY MOUTH EVERY EVENING 08/13/16  Yes Tawni Millers, MD  VICTOZA 18 MG/3ML SOPN Inject 1.2 mg once daily 07/02/16  Yes Tawni Millers, MD    Health Maintenance/Date Completed  Last Visit to PCP: 10/2016 Next Visit to PCP: 04/17/17 Specialist Visit:  01/2017 Dental Exam: 10/2016 Eye Exam: none recent Prostate Exam: N/A Pelvic/PAP Exam: 2007 Mammogram: 2017 DEXA: none Colonoscopy: ~2014 Flu Vaccine: 2017 Pneumonia Vaccine: none    Assessment and Plan: 1. Diabetes: Rarely experiences a low blood sugar. Meter is currently not working; occasionally uses her husbands meter. Last low blood sugar when meter was working was 49 mg/dl. Describes a low blood sugar as shaking, sweating and disoriented. If this happens she typically drinks juice and eats peanut butter crackers. I will contact [facility] about getting her a new meter. Discussed the meaning of A1c and how this relates to the readings on her meter. Currently taking metformin 100 mg twice daily, VIctoza 1.2mg  daily and Lantus 42 units at bedtime. Patient states she has decreased her portion sizes and fried foods. She has increased her fruits and vegetables.  A1c = 6.2%. 2. Hypertension: Blood pressure controlled per today's reading: 102/70 mg/dl. Currently on lisinopril 40 mg daily. 3. Cholesterol: TC within goal of less than 200 mg/dl; TG within goal of less than 150 mg/dl; HDL just shy of goal of greater than 50 mg/dl; LDL within goal of less than 100 mg/dl. Currently on simvastatin 20 mg at bedtime. 4. GERD: Currently on pantoprazole 40 mg daily. No issues. 5. Depression: Currently on Rexulti 2 mg daily and citalopram 60 mg daily. Patient states she is no longer on risperidone or trazodone. The trazodone does not work for her, however, she is actually sleeping more since the passing of her mother in December 2017. She is followed by [facility] and meets with her physician and counselor in March. 6. Headaches: Uses acetaminophen for headaches when they occur. 7. Arthritis: States her hands, knees and elbows are the most painful. Pain score today was 5/10. Currently using Naproxen once to twice daily. She is being referred to the dermatologist [facility] for her Psoriasis. I encouraged her to  discuss the pain with the dermatologist as this potentially could be Psoriatic Arthritis. 8. Psoriasis: Has mometasone ointment at home, however, she states this is not effective. Psoriasis on arms, legs, buttox and sides of torso. [facility] has made a referral to [facility] in [place]. 9. Compliance: patient is compliant with medications and uses a pillbox at home.  Call [facility] to make an eye exam Return to clinic in 6 months for follow up MTM.  Saliou Barnier K. Dicky Doe, PharmD [facility] Operations Coordinator [phone]

## 2016-12-31 ENCOUNTER — Telehealth: Payer: Self-pay | Admitting: Pharmacist

## 2016-12-31 NOTE — Telephone Encounter (Signed)
[employer] PAP submitted to manufacturer today for [place].

## 2017-01-06 ENCOUNTER — Other Ambulatory Visit: Payer: Self-pay

## 2017-01-06 NOTE — Telephone Encounter (Signed)
Received PAP application from [facility] for Lantus placed for provider to sign. 

## 2017-01-07 ENCOUNTER — Telehealth: Payer: Self-pay

## 2017-01-07 NOTE — Telephone Encounter (Signed)
-----   Message from Cammy Copa, [facility] sent at 01/01/2017  2:38 PM EST ----- Regarding: RE: patient follow up We have home # [phone] or cell # [phone]  ----- Message ----- From: Orene Desanctis, CMA Sent: 12/31/2016   1:31 PM To: Cammy Copa, RPH Subject: FW: patient follow up                          I've been trying to call this pt the past two days but the number in the system just gives me a fast busy signal. Do you have another number for her?? ----- Message ----- From: Cammy Copa, [facility] Sent: 12/29/2016   3:28 PM To: Orene Desanctis, CMA Subject: patient follow up                              Ebbie Latus,  I saw Mrs. Bonder today for her annual medication review. Thank you for making a referral for her to [facility]; she is having issues with her Psoriasis.  1) Would it be possible for you to schedule an eye exam for her? 2) She is interested in getting a new blood glucose meter, she has replaced the batteries but it is still not working. 3) She has a provider apt 04/17/17 for follow up labs, but currently does not have a lab appointment. If appropriate, could you please schedule the lab work.  Thank you! Keri

## 2017-01-07 NOTE — Telephone Encounter (Signed)
Made appt for eye and labs. Discussed with pt about getting new glucose meter. PT verbalized understanding.

## 2017-01-22 NOTE — Telephone Encounter (Signed)
Placed signed application/script in [facility] folder for pickup. 

## 2017-01-30 ENCOUNTER — Other Ambulatory Visit: Payer: Self-pay | Admitting: Internal Medicine

## 2017-01-30 DIAGNOSIS — E785 Hyperlipidemia, unspecified: Secondary | ICD-10-CM

## 2017-02-02 ENCOUNTER — Other Ambulatory Visit: Payer: Self-pay | Admitting: Internal Medicine

## 2017-02-02 DIAGNOSIS — K2100 Gastro-esophageal reflux disease with esophagitis, without bleeding: Secondary | ICD-10-CM

## 2017-02-04 ENCOUNTER — Ambulatory Visit: Payer: Self-pay | Admitting: Ophthalmology

## 2017-02-05 ENCOUNTER — Telehealth: Payer: Self-pay

## 2017-02-05 NOTE — Telephone Encounter (Signed)
Received PAP application from [facility] for Victoza placed for provider to sign.

## 2017-02-17 ENCOUNTER — Telehealth: Payer: Self-pay | Admitting: Pharmacist

## 2017-02-17 NOTE — Telephone Encounter (Signed)
Faxed [employer] application for Re Enrollment on Lantus Inject 42 units once daily.

## 2017-02-24 NOTE — Telephone Encounter (Signed)
Placed signed application/script in [facility] folder for pickup. 

## 2017-02-25 ENCOUNTER — Ambulatory Visit: Payer: Self-pay | Admitting: Ophthalmology

## 2017-03-24 ENCOUNTER — Telehealth: Payer: Self-pay | Admitting: Pharmacist

## 2017-03-24 NOTE — Telephone Encounter (Signed)
03/24/17 Faxed [employer] renewal for [employer] 1.2mg  once daily & Novofine 32G tips use daily with Victoza.

## 2017-04-09 ENCOUNTER — Other Ambulatory Visit: Payer: Self-pay

## 2017-04-09 DIAGNOSIS — E119 Type 2 diabetes mellitus without complications: Secondary | ICD-10-CM

## 2017-04-10 LAB — COMPREHENSIVE METABOLIC PANEL
A/G RATIO: 1.4 (ref 1.2–2.2)
ALBUMIN: 4.1 g/dL (ref 3.5–5.5)
ALK PHOS: 85 IU/L (ref 39–117)
ALT: 27 IU/L (ref 0–32)
AST: 36 IU/L (ref 0–40)
BILIRUBIN TOTAL: 1 mg/dL (ref 0.0–1.2)
BUN / CREAT RATIO: 15 (ref 9–23)
BUN: 10 mg/dL (ref 6–24)
CHLORIDE: 98 mmol/L (ref 96–106)
CO2: 22 mmol/L (ref 18–29)
Calcium: 9.9 mg/dL (ref 8.7–10.2)
Creatinine, Ser: 0.66 mg/dL (ref 0.57–1.00)
GFR calc Af Amer: 117 mL/min/{1.73_m2} (ref >59)
GFR calc non Af Amer: 102 mL/min/{1.73_m2} (ref >59)
GLOBULIN, TOTAL: 3 g/dL (ref 1.5–4.5)
Glucose: 142 mg/dL — ABNORMAL HIGH (ref 65–99)
POTASSIUM: 4 mmol/L (ref 3.5–5.2)
Sodium: 138 mmol/L (ref 134–144)
Total Protein: 7.1 g/dL (ref 6.0–8.5)

## 2017-04-10 LAB — CBC
Hematocrit: 37.9 % (ref 34.0–46.6)
Hemoglobin: 12.5 g/dL (ref 11.1–15.9)
MCH: 26.8 pg (ref 26.6–33.0)
MCHC: 33 g/dL (ref 31.5–35.7)
MCV: 81 fL (ref 79–97)
Platelets: 262 10*3/uL (ref 150–379)
RBC: 4.66 x10E6/uL (ref 3.77–5.28)
RDW: 14.2 % (ref 12.3–15.4)
WBC: 5.9 10*3/uL (ref 3.4–10.8)

## 2017-04-10 LAB — HEMOGLOBIN A1C
ESTIMATED AVERAGE GLUCOSE: 180 mg/dL
Hgb A1c MFr Bld: 7.9 % — ABNORMAL HIGH (ref 4.8–5.6)

## 2017-04-16 ENCOUNTER — Telehealth: Payer: Self-pay

## 2017-04-16 ENCOUNTER — Ambulatory Visit: Payer: Self-pay | Admitting: Adult Health Nurse Practitioner

## 2017-04-16 VITALS — BP 108/75 | HR 88 | Temp 98.5°F | Wt 275.0 lb

## 2017-04-16 DIAGNOSIS — Z794 Long term (current) use of insulin: Principal | ICD-10-CM

## 2017-04-16 DIAGNOSIS — E785 Hyperlipidemia, unspecified: Secondary | ICD-10-CM

## 2017-04-16 DIAGNOSIS — I10 Essential (primary) hypertension: Secondary | ICD-10-CM

## 2017-04-16 DIAGNOSIS — E119 Type 2 diabetes mellitus without complications: Secondary | ICD-10-CM

## 2017-04-16 LAB — GLUCOSE, POCT (MANUAL RESULT ENTRY): POC Glucose: 99 mg/dl (ref 70–99)

## 2017-04-16 NOTE — Telephone Encounter (Signed)
Placed signed application/script in [facility] folder for pickup. 

## 2017-04-16 NOTE — Progress Notes (Signed)
Patient: Abigail Molina Female    DOB: August 04, 1966   51 y.o.   MRN: [id number] Visit Date: 04/16/2017  Today's Provider: Staci Acosta, NP   No chief complaint on file.  Subjective:    HPI   DM:  Taking medications as directed.  Lantus: 42 units nightly.  CBGs average: 90-200 throughout the day.    HTN/HLD:  Taking medications as directed.  Somewhat monitoring diet.  Not much exercise.  Last LDL was 50 in Nov 2017.     No Known Allergies Previous Medications   ACETAMINOPHEN (TYLENOL) 325 MG TABLET    Take 650 mg by mouth as needed for headache.   ASPIRIN 81 MG TABLET    Take 81 mg by mouth daily.   BREXPIPRAZOLE (REXULTI) 2 MG TABS    Take 2 mg by mouth.   CHOLECALCIFEROL (VITAMIN D) 1000 UNITS TABLET    Take 1,000 Units by mouth daily.   CITALOPRAM (CELEXA) 40 MG TABLET    Take 60 mg by mouth daily.   INSULIN GLARGINE (LANTUS) 100 UNIT/ML INJECTION    Inject 42 Units into the skin at bedtime. Inject 42 units once daily.   INSULIN PEN NEEDLE (NOVOFINE) 32G X 6 MM MISC    1 Syringe by Does not apply route. Use with Victoza.   LISINOPRIL (PRINIVIL,ZESTRIL) 40 MG TABLET    TAKE ONE TABLET BY MOUTH ONCE A DAY. REPLACES ACCUPRIL.   METFORMIN (GLUCOPHAGE) 1000 MG TABLET    Take 1 tablet (1,000 mg total) by mouth 2 (two) times daily with a meal.   MOMETASONE (ELOCON) 0.1 % OINTMENT    Apply topically daily. Apply as needed for psoriasis   NAPROXEN (NAPROSYN) 250 MG TABLET    Take 500 mg by mouth 2 (two) times daily with a meal. Once to twice daily   PANTOPRAZOLE (PROTONIX) 40 MG TABLET    TAKE 1 TABLET BY MOUTH ONCE DAILY. REPLACES [place].   SIMVASTATIN (ZOCOR) 20 MG TABLET    TAKE ONE TABLET BY MOUTH EVERY EVENING   VICTOZA 18 MG/3ML SOPN    Inject 1.2 mg once daily    Review of Systems  All other systems reviewed and are negative.   Social History  Substance Use Topics  . Smoking status: Never Smoker  . Smokeless tobacco: Never Used  . Alcohol use Yes     Comment:  Rarely.   Objective:   BP 108/75   Pulse 88   Temp 98.5 F (36.9 C)   Wt 275 lb (124.7 kg)   LMP 10/15/2016 (Within Months)   BMI 45.76 kg/m   Physical Exam  Constitutional: She is oriented to person, place, and time. She appears well-developed and well-nourished.  HENT:  Head: Normocephalic and atraumatic.  Neck: Normal range of motion. Neck supple.  Cardiovascular: Normal rate, regular rhythm and normal heart sounds.   Pulmonary/Chest: Effort normal and breath sounds normal.  Abdominal: Soft. Bowel sounds are normal.  Neurological: She is alert and oriented to person, place, and time.  Skin: Skin is warm and dry.        Assessment & Plan:      DM:  Goal <7.  Not controlled.  Encourage stricter diabetic diet and exercise.  Continue current medication regimen.  If A1C still elevated at next OV will increase insulin.   HLD:   Continue current regimen.  Encourage low cholesterol, low fat diet and exercise.  Lipid panel on next lab draw.   HTN:  Controlled.  Goal BP <140/80.  Continue current medication regimen.  Encourage low salt diet and exercise.             Staci Acosta, NP   [facility]

## 2017-04-16 NOTE — Telephone Encounter (Signed)
Received PAP application from [facility] for Lantus placed for provider to sign. 

## 2017-04-20 ENCOUNTER — Telehealth: Payer: Self-pay | Admitting: Pharmacist

## 2017-04-20 NOTE — Telephone Encounter (Signed)
04/17/17 Faxed refill request to [employer] for Lantus Vials Inject 42 units under the skin once daily.

## 2017-05-04 ENCOUNTER — Other Ambulatory Visit: Payer: Self-pay | Admitting: Internal Medicine

## 2017-05-04 DIAGNOSIS — E785 Hyperlipidemia, unspecified: Secondary | ICD-10-CM

## 2017-05-12 ENCOUNTER — Telehealth: Payer: Self-pay

## 2017-05-12 NOTE — Telephone Encounter (Signed)
Pt called in with c/o of high sugars. One was in the 300's another one in the 200's and this past time it was 144. I explained to pt to watch her diet and the sugars. She was c/o dizziness. I told pt to check her bp as well. Appt made. Pt will monitor sugars and bp. Told pt to head to ER if sugars continue to stay high because we could not get her in until next Thursday. PT verbalized understanding.

## 2017-05-21 ENCOUNTER — Ambulatory Visit: Payer: Self-pay | Admitting: Family Medicine

## 2017-05-21 VITALS — BP 132/82 | HR 93 | Temp 98.8°F | Wt 277.2 lb

## 2017-05-21 DIAGNOSIS — Z794 Long term (current) use of insulin: Secondary | ICD-10-CM

## 2017-05-21 DIAGNOSIS — H532 Diplopia: Secondary | ICD-10-CM

## 2017-05-21 DIAGNOSIS — E119 Type 2 diabetes mellitus without complications: Secondary | ICD-10-CM

## 2017-05-21 DIAGNOSIS — R197 Diarrhea, unspecified: Secondary | ICD-10-CM

## 2017-05-21 DIAGNOSIS — R42 Dizziness and giddiness: Secondary | ICD-10-CM

## 2017-05-21 LAB — GLUCOSE, POCT (MANUAL RESULT ENTRY): POC Glucose: 391 mg/dl — AB (ref 70–99)

## 2017-05-21 NOTE — Patient Instructions (Addendum)

## 2017-05-21 NOTE — Progress Notes (Signed)
BP 132/82   Pulse 93   Temp 98.8 F (37.1 C)   Wt 277 lb 3.2 oz (125.7 kg)   LMP 10/15/2016 (Within Months)   BMI 46.13 kg/m    Subjective:    Patient ID: Abigail Molina, female    DOB: 1966/04/09, 51 y.o.   MRN: [id number]  HPI: Abigail Molina is a 51 y.o. female  Chief Complaint  Patient presents with  . Nausea  . Diabetes   DIABETES- called about a week ago with high blood sugars in the 200s-300s, last A1c 1 month ago was 7.9, a week ago had dry heaving and diarrhea for 3 days, gone now Hypoglycemic episodes:no   Polydipsia/polyuria: yes Visual disturbance: no Chest pain: no Paresthesias: no Glucose Monitoring: yes  Accucheck frequency: BID  Fasting glucose:150s-160s  Post prandial: 200  In the middle of the night: 70-80 Taking Insulin?: yes  Long acting insulin: 42 units lantus nightly Blood Pressure Monitoring: checking about every 3 days  DIZZINESS Duration: 2.5 weeks Description of symptoms: room spinning Duration of episode: minute Dizziness frequency: recurrent, a couple of times during the day Provoking factors: unknown Aggravating factors: unknown  Triggered by rolling over in bed: no Triggered by bending over: yes- lightheaded, not room shaking Aggravated by head movement: no Aggravated by exertion, coughing, loud noises: no Recent head injury: no Recent or current viral symptoms: no History of vasovagal episodes: no Nausea: yes Vomiting: no Tinnitus: no Hearing loss: no Aural fullness: no Headache: yes Photophobia/phonophobia: no Unsteady gait: yes- first thing in morning Postural instability: yes- very rarely Diplopia, dysarthria, dysphagia or weakness: yes- double vision with dizzy spells Related to exertion: no Pallor: no Diaphoresis: no Dyspnea: no Chest pain: no   Relevant past medical, surgical, family and social history reviewed and updated as indicated. Interim medical history since our last visit reviewed. Allergies and  medications reviewed and updated.  Review of Systems  Constitutional: Negative.   Respiratory: Negative.   Cardiovascular: Negative.   Neurological: Positive for dizziness, light-headedness and headaches. Negative for tremors, seizures, syncope, facial asymmetry, speech difficulty, weakness and numbness.    Per HPI unless specifically indicated above     Objective:    BP 132/82   Pulse 93   Temp 98.8 F (37.1 C)   Wt 277 lb 3.2 oz (125.7 kg)   LMP 10/15/2016 (Within Months)   BMI 46.13 kg/m   Wt Readings from Last 3 Encounters:  05/21/17 277 lb 3.2 oz (125.7 kg)  04/16/17 275 lb (124.7 kg)  12/29/16 261 lb (118.4 kg)    Physical Exam  Constitutional: She is oriented to person, place, and time. She appears well-developed and well-nourished. No distress.  HENT:  Head: Normocephalic and atraumatic.  Right Ear: Hearing normal.  Left Ear: Hearing normal.  Nose: Nose normal.  Eyes: Conjunctivae and lids are normal. Pupils are equal, round, and reactive to light. Right eye exhibits no discharge. Left eye exhibits no discharge. No scleral icterus. Right eye exhibits normal extraocular motion and no nystagmus. Left eye exhibits nystagmus. Left eye exhibits normal extraocular motion.  Cardiovascular: Normal rate, regular rhythm, normal heart sounds and intact distal pulses.  Exam reveals no gallop and no friction rub.   No murmur heard. Pulmonary/Chest: Effort normal and breath sounds normal. No respiratory distress. She has no wheezes. She has no rales. She exhibits no tenderness.  Musculoskeletal: Normal range of motion.  Neurological: She is alert and oriented to person, place, and time.  Skin:  Skin is warm and intact. No rash noted. She is not diaphoretic. No erythema. No pallor.  Psychiatric: She has a normal mood and affect. Her speech is normal and behavior is normal. Judgment and thought content normal. Cognition and memory are normal.  Nursing note and vitals  reviewed.   Results for orders placed or performed in visit on 05/21/17  POCT Glucose (CBG)  Result Value Ref Range   POC Glucose 391 (A) 70 - 99 mg/dl      Assessment & Plan:   Problem List Items Addressed This Visit      Endocrine   Diabetes ([place])    Not under good control Sugars all over the place, including low in the middle of the night. Will not change insulin dose given sugars in the 70s-80s around midnight. Really watch sweet intake (has been binging on sweet tea) and carb intake. Recheck next month. A1c due in August.        Other Visit Diagnoses    Diabetes mellitus without complication ([place])    -  Primary   Relevant Orders   POCT Glucose (CBG) (Completed)   Basic metabolic panel   Vertigo       + nystagmus. Concern for BPPV. Epley's manuver given to patient today. Call if not getting better or getting worse.    Relevant Orders   Basic metabolic panel   Diarrhea, unspecified type       Unknown cause- will check BMP given diabetes. Resolved now. Call if it comes back.    Relevant Orders   Basic metabolic panel   Diplopia       With vertigo- will get her into see eye doctor ASAP. Appointment set up.    Lightheadedness       Likely due to sitting up too fast. Increase water intake and sit up more slowly. Call if not getting better.        Follow up plan: Return for eye appointment ASAP, Labs next week, follow up 1 month.

## 2017-05-21 NOTE — Assessment & Plan Note (Signed)
Not under good control Sugars all over the place, including low in the middle of the night. Will not change insulin dose given sugars in the 70s-80s around midnight. Really watch sweet intake (has been binging on sweet tea) and carb intake. Recheck next month. A1c due in August.

## 2017-05-26 ENCOUNTER — Other Ambulatory Visit: Payer: Self-pay

## 2017-05-26 DIAGNOSIS — R42 Dizziness and giddiness: Secondary | ICD-10-CM

## 2017-05-26 DIAGNOSIS — E119 Type 2 diabetes mellitus without complications: Secondary | ICD-10-CM

## 2017-05-26 DIAGNOSIS — R197 Diarrhea, unspecified: Secondary | ICD-10-CM

## 2017-05-27 ENCOUNTER — Telehealth: Payer: Self-pay | Admitting: Pharmacist

## 2017-05-27 LAB — BASIC METABOLIC PANEL
BUN / CREAT RATIO: 12 (ref 9–23)
BUN: 10 mg/dL (ref 6–24)
CALCIUM: 9.6 mg/dL (ref 8.7–10.2)
CO2: 18 mmol/L — ABNORMAL LOW (ref 20–29)
Chloride: 100 mmol/L (ref 96–106)
Creatinine, Ser: 0.82 mg/dL (ref 0.57–1.00)
GFR, EST AFRICAN AMERICAN: 95 mL/min/{1.73_m2} (ref >59)
GFR, EST NON AFRICAN AMERICAN: 83 mL/min/{1.73_m2} (ref >59)
Glucose: 139 mg/dL — ABNORMAL HIGH (ref 65–99)
POTASSIUM: 4.4 mmol/L (ref 3.5–5.2)
SODIUM: 141 mmol/L (ref 134–144)

## 2017-05-27 NOTE — Telephone Encounter (Signed)
05/27/17 Called [employer] spoke with Purcell Nails and placed refill for Rexulti 2mg . [name]

## 2017-05-29 ENCOUNTER — Other Ambulatory Visit: Payer: Self-pay | Admitting: Internal Medicine

## 2017-06-09 ENCOUNTER — Other Ambulatory Visit: Payer: Self-pay | Admitting: Internal Medicine

## 2017-06-09 DIAGNOSIS — K2100 Gastro-esophageal reflux disease with esophagitis, without bleeding: Secondary | ICD-10-CM

## 2017-06-11 ENCOUNTER — Ambulatory Visit: Payer: Self-pay | Admitting: Ophthalmology

## 2017-06-18 ENCOUNTER — Ambulatory Visit: Payer: Self-pay | Admitting: Urology

## 2017-06-18 VITALS — BP 109/72 | HR 76 | Wt 278.0 lb

## 2017-06-18 DIAGNOSIS — R42 Dizziness and giddiness: Secondary | ICD-10-CM

## 2017-06-18 DIAGNOSIS — E119 Type 2 diabetes mellitus without complications: Secondary | ICD-10-CM

## 2017-06-18 DIAGNOSIS — Z794 Long term (current) use of insulin: Principal | ICD-10-CM

## 2017-06-18 LAB — GLUCOSE, POCT (MANUAL RESULT ENTRY): POC Glucose: 133 mg/dl — AB (ref 70–99)

## 2017-06-18 NOTE — Progress Notes (Signed)
  Patient: Abigail Molina Female    DOB: [birth year]   51 y.o.   MRN: [id number] Visit Date: 06/18/2017  Today's Provider: Zara Council, PA-C   Chief Complaint  Patient presents with  . Diabetes  . Dizziness   Subjective:    HPI DM - POCT CBG was 133 tonight down from 391 one month ago  Vertigo - OTC medication is not helping - doing exercises at home  Diplopia - still needs eye exam  BMP was normal  No Known Allergies Previous Medications   ACETAMINOPHEN (TYLENOL) 325 MG TABLET    Take 650 mg by mouth as needed for headache.   ASPIRIN 81 MG TABLET    Take 81 mg by mouth daily.   BREXPIPRAZOLE (REXULTI) 2 MG TABS    Take 2 mg by mouth.   CHOLECALCIFEROL (VITAMIN D) 1000 UNITS TABLET    Take 1,000 Units by mouth daily.   CITALOPRAM (CELEXA) 40 MG TABLET    Take 60 mg by mouth daily.   INSULIN GLARGINE (LANTUS) 100 UNIT/ML INJECTION    Inject 42 Units into the skin at bedtime. Inject 42 units once daily.   INSULIN PEN NEEDLE (NOVOFINE) 32G X 6 MM MISC    1 Syringe by Does not apply route. Use with Victoza.   LISINOPRIL (PRINIVIL,ZESTRIL) 40 MG TABLET    TAKE ONE TABLET BY MOUTH ONCE A DAY. REPLACES ACCUPRIL.   METFORMIN (GLUCOPHAGE) 1000 MG TABLET    Take 1 tablet (1,000 mg total) by mouth 2 (two) times daily with a meal.   MOMETASONE (ELOCON) 0.1 % OINTMENT    Apply topically daily. Apply as needed for psoriasis   NAPROXEN (NAPROSYN) 250 MG TABLET    TAKE TWO TABLETS BY MOUTH 2 TIMES A DAY.   PANTOPRAZOLE (PROTONIX) 40 MG TABLET    TAKE 1 TABLET BY MOUTH ONCE DAILY. REPLACES [place].   SIMVASTATIN (ZOCOR) 20 MG TABLET    TAKE ONE TABLET BY MOUTH EVERY EVENING   VICTOZA 18 MG/3ML SOPN    Inject 1.2 mg once daily    Review of Systems  Social History  Substance Use Topics  . Smoking status: Never Smoker  . Smokeless tobacco: Never Used  . Alcohol use Yes     Comment: Rarely.   Objective:   BP 109/72 (BP Location: Left Arm, Patient Position: Sitting)   Pulse 76   Wt 278 lb  (126.1 kg)   LMP 10/15/2016 (Within Months)   BMI 46.26 kg/m   Physical Exam Constitutional: Well nourished. Alert and oriented, No acute distress. HEENT: [place] AT, moist mucus membranes. Trachea midline, no masses. Cardiovascular: No clubbing, cyanosis, or edema. Respiratory: Normal respiratory effort, no increased work of breathing. Skin: No rashes, bruises or suspicious lesions. Lymph: No cervical or inguinal adenopathy. Neurologic: Grossly intact, no focal deficits, moving all 4 extremities. Psychiatric: Normal mood and affect.      Assessment & Plan:     1. Vertigo  - continue the meclizine  - continue good sugar control  - refer to PT  2. Diabetes  - PCOT cbg done to 139   - follow up in August for [place], PA-C   [facility]

## 2017-06-25 ENCOUNTER — Ambulatory Visit: Payer: Self-pay | Admitting: Ophthalmology

## 2017-07-02 ENCOUNTER — Ambulatory Visit: Payer: Self-pay | Admitting: Ophthalmology

## 2017-07-13 ENCOUNTER — Other Ambulatory Visit: Payer: Self-pay | Admitting: Internal Medicine

## 2017-07-16 ENCOUNTER — Other Ambulatory Visit: Payer: Self-pay

## 2017-07-16 ENCOUNTER — Telehealth: Payer: Self-pay

## 2017-07-16 DIAGNOSIS — E785 Hyperlipidemia, unspecified: Secondary | ICD-10-CM

## 2017-07-16 DIAGNOSIS — E119 Type 2 diabetes mellitus without complications: Secondary | ICD-10-CM

## 2017-07-16 DIAGNOSIS — Z794 Long term (current) use of insulin: Principal | ICD-10-CM

## 2017-07-16 NOTE — Telephone Encounter (Signed)
Received PAP application from [facility] for Victozia and Lantus placed for provider to sign.

## 2017-07-16 NOTE — Telephone Encounter (Signed)
Placed signed application/script in [facility] folder for pickup. 

## 2017-07-17 LAB — COMPREHENSIVE METABOLIC PANEL
ALT: 15 IU/L (ref 0–32)
AST: 31 IU/L (ref 0–40)
Albumin/Globulin Ratio: 1.3 (ref 1.2–2.2)
Albumin: 4.2 g/dL (ref 3.5–5.5)
Alkaline Phosphatase: 72 IU/L (ref 39–117)
BUN/Creatinine Ratio: 12 (ref 9–23)
BUN: 9 mg/dL (ref 6–24)
Bilirubin Total: 0.9 mg/dL (ref 0.0–1.2)
CALCIUM: 10.1 mg/dL (ref 8.7–10.2)
CO2: 20 mmol/L (ref 20–29)
CREATININE: 0.78 mg/dL (ref 0.57–1.00)
Chloride: 102 mmol/L (ref 96–106)
GFR calc Af Amer: 101 mL/min/{1.73_m2} (ref >59)
GFR, EST NON AFRICAN AMERICAN: 88 mL/min/{1.73_m2} (ref >59)
GLOBULIN, TOTAL: 3.3 g/dL (ref 1.5–4.5)
Glucose: 143 mg/dL — ABNORMAL HIGH (ref 65–99)
Potassium: 4.7 mmol/L (ref 3.5–5.2)
Sodium: 140 mmol/L (ref 134–144)
TOTAL PROTEIN: 7.5 g/dL (ref 6.0–8.5)

## 2017-07-17 LAB — LIPID PANEL
CHOLESTEROL TOTAL: 151 mg/dL (ref 100–199)
Chol/HDL Ratio: 3 ratio (ref 0.0–4.4)
HDL: 50 mg/dL (ref >39)
LDL CALC: 76 mg/dL (ref 0–99)
Triglycerides: 127 mg/dL (ref 0–149)
VLDL Cholesterol Cal: 25 mg/dL (ref 5–40)

## 2017-07-17 LAB — HEMOGLOBIN A1C
Est. average glucose Bld gHb Est-mCnc: 143 mg/dL
HEMOGLOBIN A1C: 6.6 % — AB (ref 4.8–5.6)

## 2017-07-20 ENCOUNTER — Telehealth: Payer: Self-pay | Admitting: Pharmacist

## 2017-07-20 ENCOUNTER — Encounter: Payer: Self-pay | Admitting: Pharmacist

## 2017-07-20 NOTE — Telephone Encounter (Signed)
07/20/17 Faxed [employer] a refill request for Lantus Vials Inject 42 units once daily.AJ 07/20/17 Faxed [employer] a refill request for [employer] 1.2mg  once daily & Novofine 32G tips.[name]

## 2017-07-23 ENCOUNTER — Ambulatory Visit: Payer: Self-pay | Admitting: Family Medicine

## 2017-07-23 VITALS — BP 132/82 | HR 90 | Temp 98.1°F | Wt 268.0 lb

## 2017-07-23 DIAGNOSIS — E785 Hyperlipidemia, unspecified: Secondary | ICD-10-CM

## 2017-07-23 DIAGNOSIS — J069 Acute upper respiratory infection, unspecified: Secondary | ICD-10-CM

## 2017-07-23 DIAGNOSIS — Z794 Long term (current) use of insulin: Principal | ICD-10-CM

## 2017-07-23 DIAGNOSIS — E7800 Pure hypercholesterolemia, unspecified: Secondary | ICD-10-CM

## 2017-07-23 DIAGNOSIS — E119 Type 2 diabetes mellitus without complications: Secondary | ICD-10-CM

## 2017-07-23 DIAGNOSIS — I10 Essential (primary) hypertension: Secondary | ICD-10-CM

## 2017-07-23 LAB — GLUCOSE, POCT (MANUAL RESULT ENTRY): POC Glucose: 140 mg/dl — AB (ref 70–99)

## 2017-07-23 MED ORDER — SIMVASTATIN 20 MG PO TABS: 20.0000 mg | ORAL_TABLET | Freq: Every evening | ORAL | 4 refills | Status: DC

## 2017-07-23 MED ORDER — METFORMIN HCL 1000 MG PO TABS: 1000.0000 mg | ORAL_TABLET | Freq: Two times a day (BID) | ORAL | 4 refills | Status: DC

## 2017-07-23 MED ORDER — LISINOPRIL 40 MG PO TABS: 40.0000 mg | ORAL_TABLET | Freq: Every day | ORAL | 4 refills | Status: DC

## 2017-07-23 NOTE — Assessment & Plan Note (Signed)
The current medical regimen is effective;  continue present plan and medications.  

## 2017-07-23 NOTE — Assessment & Plan Note (Addendum)
The current medical regimen is effective;  continue present plan and medications. Patient will continue good management of diabetes especially with weight loss. Hopefully with further weight loss will run into the problem of being overmedicated and having to cut back on medications  Some confusion on diabetes medications was have not.been refilled for over a year. Patient still getting prescriptions will not adjust these medicines

## 2017-07-23 NOTE — Progress Notes (Signed)
BP 132/82 (BP Location: Left Arm, Patient Position: Sitting)   Pulse 90   Temp 98.1 F (36.7 C)   Wt 268 lb (121.6 kg)   LMP 10/15/2016 (Within Months)   BMI 44.60 kg/m    Subjective:    Patient ID: Abigail Molina, female    DOB: 1966/05/13, 51 y.o.   MRN: [id number]  HPI: Abigail Molina is a 51 y.o. female  Chief Complaint  Patient presents with  . Sore Throat    Had flu about a week and a half prior to the visit   Physical colds getting a little bit better some sinus drainage is slight cough but nothing special not very bad. Has taken some over-the-counter fluid medicine, which everything seems to be getting a little better. Taking extensive diabetes medication without problems noted low blood sugar spells and doing well with her medications and with good control. Patient has been especially good and lost 10 pounds since last visit.  Relevant past medical, surgical, family and social history reviewed and updated as indicated. Interim medical history since our last visit reviewed. Allergies and medications reviewed and updated.  Review of Systems  Constitutional: Negative.   Respiratory: Negative.   Cardiovascular: Negative.     Per HPI unless specifically indicated above     Objective:    BP 132/82 (BP Location: Left Arm, Patient Position: Sitting)   Pulse 90   Temp 98.1 F (36.7 C)   Wt 268 lb (121.6 kg)   LMP 10/15/2016 (Within Months)   BMI 44.60 kg/m   Wt Readings from Last 3 Encounters:  07/23/17 268 lb (121.6 kg)  06/18/17 278 lb (126.1 kg)  05/21/17 277 lb 3.2 oz (125.7 kg)    Physical Exam  Constitutional: She is oriented to person, place, and time. She appears well-developed and well-nourished.  HENT:  Head: Normocephalic and atraumatic.  Right Ear: External ear normal.  Left Ear: External ear normal.  Mouth/Throat: Oropharynx is clear and moist.  Eyes: Conjunctivae and EOM are normal.  Neck: Normal range of motion.  Cardiovascular: Normal rate,  regular rhythm and normal heart sounds.   Pulmonary/Chest: Effort normal and breath sounds normal.  Musculoskeletal: Normal range of motion.  Neurological: She is alert and oriented to person, place, and time.  Skin: No erythema.  Psychiatric: She has a normal mood and affect. Her behavior is normal. Judgment and thought content normal.    Results for orders placed or performed in visit on 07/23/17  POCT Glucose (CBG)  Result Value Ref Range   POC Glucose 140 (A) 70 - 99 mg/dl      Assessment & Plan:   Problem List Items Addressed This Visit      Cardiovascular and Mediastinum   Hypertension    The current medical regimen is effective;  continue present plan and medications.       Relevant Medications   lisinopril (PRINIVIL,ZESTRIL) 40 MG tablet   simvastatin (ZOCOR) 20 MG tablet     Endocrine   Diabetes ([place]) - Primary    The current medical regimen is effective;  continue present plan and medications. Patient will continue good management of diabetes especially with weight loss. Hopefully with further weight loss will run into the problem of being overmedicated and having to cut back on medications  Some confusion on diabetes medications was have not.been refilled for over a year. Patient still getting prescriptions will not adjust these medicines      Relevant Medications   metFORMIN (  GLUCOPHAGE) 1000 MG tablet   lisinopril (PRINIVIL,ZESTRIL) 40 MG tablet   simvastatin (ZOCOR) 20 MG tablet   Other Relevant Orders   POCT Glucose (CBG) (Completed)     Other   Hyperlipidemia    The current medical regimen is effective;  continue present plan and medications.       Relevant Medications   lisinopril (PRINIVIL,ZESTRIL) 40 MG tablet   simvastatin (ZOCOR) 20 MG tablet    Other Visit Diagnoses    Viral upper respiratory tract infection       We'll continue supportive care for upper respiratory tract infection if not getting better will notify.       Follow up  plan: Return in about 3 months (around 10/23/2017) for Hemoglobin A1c.

## 2017-08-06 ENCOUNTER — Other Ambulatory Visit: Payer: Self-pay | Admitting: Internal Medicine

## 2017-08-25 ENCOUNTER — Other Ambulatory Visit: Payer: Self-pay | Admitting: Internal Medicine

## 2017-08-25 DIAGNOSIS — K2100 Gastro-esophageal reflux disease with esophagitis, without bleeding: Secondary | ICD-10-CM

## 2017-08-31 ENCOUNTER — Encounter (INDEPENDENT_AMBULATORY_CARE_PROVIDER_SITE_OTHER): Payer: Self-pay

## 2017-08-31 ENCOUNTER — Ambulatory Visit: Payer: Self-pay

## 2017-08-31 VITALS — BP 124/78 | Ht 65.0 in | Wt 268.0 lb

## 2017-08-31 DIAGNOSIS — Z79899 Other long term (current) drug therapy: Secondary | ICD-10-CM

## 2017-08-31 NOTE — Progress Notes (Deleted)
APAP: typically takes once a day   Psoriasis not controlled with cream   Depression managed well   Diabetes - A1c dropped to 6.6; Needs new glucose meter   GERD -  Well managed   Hyperlipidemia -   Hypertension - Well managed

## 2017-08-31 NOTE — Progress Notes (Signed)
Medication Management Clinic Visit Note  Patient: Abigail Molina MRN: [id number] Date of Birth: 1966-01-10 PCP: No primary care provider on file.   Abigail Molina 51 y.o. female presents for a medication therapy management visit with the pharmacist today.  BP 124/78 (BP Location: Right Arm, Patient Position: Sitting, Cuff Size: Large)   Ht 5\' 5"  (1.651 m)   Wt 268 lb (121.6 kg)   LMP 10/15/2016 (Within Months)   BMI 44.60 kg/m   Patient Information   Past Medical History:  Diagnosis Date  . Cancer [facility])    Endometrial cancer  . Depression   . Diabetes mellitus without complication ([place])   . GERD (gastroesophageal reflux disease)   . Hyperlipidemia   . Hypertension   . Psoriasis 2015      Past Surgical History:  Procedure Laterality Date  . ABDOMINAL HYSTERECTOMY  2007  . CHOLECYSTECTOMY  2000     Family History  Problem Relation Age of Onset  . Diabetes Mother   . Hypertension Mother   . Depression Mother     New Diagnoses (since last visit): None   Family Support: Good  Lifestyle Diet: Breakfast: Apple or half PB sandwich  Lunch: Sandwich - whole wheat; PB or deli meat   Dinner: Meat and vegetables (green beans, corn, sweat peas, beets, tomatoes), starch (bread, rice, baked potato) Drinks: Diet Dr Malachi Bonds, water     Current Exercise Habits: Home exercise routine   Encouraged exercise. Patient stated that cousin recently moved in with her. I encouraged her to take walks around neighborhood a couple of times a week and to incorporate that into her routine.    History  Alcohol Use  . Yes    Comment:  Rarely. One or two drinks a year       History  Smoking Status  . Never Smoker  Smokeless Tobacco  . Never Used      Health Maintenance  Topic Date Due  . PNEUMOCOCCAL POLYSACCHARIDE VACCINE (1) 11/01/1966  . FOOT EXAM  11/01/1974  . OPHTHALMOLOGY EXAM  11/01/1974  . HIV Screening  11/02/1979  . TETANUS/TDAP  11/02/1983  . PAP SMEAR  11/01/1985   . MAMMOGRAM  11/01/2014  . COLONOSCOPY  11/01/2014  . INFLUENZA VACCINE  07/01/2017  . HEMOGLOBIN A1C  01/16/2018  . Hepatitis C Screening  Completed    Health Maintenance/Date Completed  Last ED visit: Years  Last Visit to PCP: [facility] Aug 2018 Next Visit to PCP: Scheduled Nov 2018 Specialist Visit:  Dental Exam: Years  Eye Exam: [facility] July 2018  Pelvic/PAP Exam: Total hysterectomy 2007 Mammogram: A year ago  Colonoscopy: A few years ago  Flu Vaccine: Not yet - planning on getting  Pneumonia Vaccine: Never   Assessment and Plan:  Depression: Well managed with brexpiprazole and citalopram. Patient stated she went through difficult time with loss of her mom and dad but that her medications managed to help her "be in a better place than she would have otherwise" Diabetes: Well managed. Currently taking lantus, metformin, and victoza. Stated that her A1c was 6.6 and that sugars were well controlled at office visits. Is not currently measuring at home because her meter "is broken". We will reach out to [facility] to see if can get her a new meter.  GERD: Well managed on pantoprazole  Hypertension: Well managed on lisinopril. BP normal at visit today.  Hyperlipidemia: Well managed. Currently on Simvastatin.  Psoriasis: No well managed. Still using  mometasone cream but states that she knows it is not working for her. Concern is being able to afford visit to dermatologist. We currently have clobetasol in clinic and are going to reach out to [facility] to see if we can get her a prescription to see if this will help more.  Arthritis: Well managed with tylenol. Typically takes once a day.    Lendon Ka, PharmD Pharmacy Resident

## 2017-10-02 ENCOUNTER — Telehealth: Payer: Self-pay | Admitting: Pharmacist

## 2017-10-02 NOTE — Telephone Encounter (Signed)
10/02/17 Faxed refill request to [employer] for Lantus Vials Inject 42 units under the skin once daily #4.[name]

## 2017-10-15 ENCOUNTER — Other Ambulatory Visit: Payer: Self-pay

## 2017-10-15 DIAGNOSIS — E119 Type 2 diabetes mellitus without complications: Secondary | ICD-10-CM

## 2017-10-16 LAB — HEMOGLOBIN A1C
ESTIMATED AVERAGE GLUCOSE: 151 mg/dL
HEMOGLOBIN A1C: 6.9 % — AB (ref 4.8–5.6)

## 2017-10-26 ENCOUNTER — Telehealth: Payer: Self-pay | Admitting: Pharmacist

## 2017-10-26 NOTE — Telephone Encounter (Signed)
10/26/17 Faxed [employer] renewal application for Rexulti 2mg  Take 1 tablet daily at bedtime # 90, provider Dr. Sherlynn Stalls only authorized 1 refill.Abigail Molina

## 2017-10-30 ENCOUNTER — Other Ambulatory Visit: Payer: Self-pay | Admitting: Internal Medicine

## 2017-10-30 DIAGNOSIS — K2100 Gastro-esophageal reflux disease with esophagitis, without bleeding: Secondary | ICD-10-CM

## 2017-11-11 ENCOUNTER — Telehealth: Payer: Self-pay

## 2017-11-11 NOTE — Telephone Encounter (Signed)
Per Teah pt needs an upcoming appt. Tried calling to schedule. Left msg.

## 2017-11-19 ENCOUNTER — Telehealth: Payer: Self-pay | Admitting: Pharmacist

## 2017-11-19 NOTE — Telephone Encounter (Signed)
11/19/2017 Faxed [employer] refill request for Victoza Inject 1.2mg  once daily,# 4 and Novofine 32G tips.[name]

## 2017-12-03 ENCOUNTER — Ambulatory Visit: Payer: Self-pay | Admitting: Adult Health Nurse Practitioner

## 2017-12-03 VITALS — BP 133/72 | HR 80 | Temp 97.9°F | Wt 285.7 lb

## 2017-12-03 DIAGNOSIS — Z794 Long term (current) use of insulin: Secondary | ICD-10-CM

## 2017-12-03 DIAGNOSIS — E119 Type 2 diabetes mellitus without complications: Secondary | ICD-10-CM

## 2017-12-03 DIAGNOSIS — E785 Hyperlipidemia, unspecified: Secondary | ICD-10-CM

## 2017-12-03 DIAGNOSIS — I10 Essential (primary) hypertension: Secondary | ICD-10-CM

## 2017-12-03 MED ORDER — VICTOZA 18 MG/3ML ~~LOC~~ SOPN: PEN_INJECTOR | SUBCUTANEOUS | 2 refills | Status: DC

## 2017-12-03 NOTE — Progress Notes (Signed)
Subjective:    Patient ID: Abigail Molina, female    DOB: Apr 30, 1966, 52 y.o.   MRN: [id number]  HPI   Abigail Molina is a 52 yo female here for a f/u of diabetes. Her A1c 1 mo ago was 6.9 from 6.6.   Taking medications as directed.  Patient Active Problem List   Diagnosis Date Noted  . Diabetes ([place]) 12/12/2015  . Hyperlipidemia 12/12/2015  . Hypertension 05/23/2015  . Acid reflux 05/23/2015   Allergies as of 12/03/2017   No Known Allergies     Medication List        Accurate as of 12/03/17  7:02 PM. Always use your most recent med list.          acetaminophen 325 MG tablet Commonly known as:  TYLENOL Take 650 mg by mouth as needed for headache.   aspirin 81 MG tablet Take 81 mg by mouth daily.   cholecalciferol 1000 units tablet Commonly known as:  VITAMIN D Take 1,000 Units by mouth daily.   citalopram 40 MG tablet Commonly known as:  CELEXA Take 60 mg by mouth daily.   insulin glargine 100 UNIT/ML injection Commonly known as:  LANTUS Inject 42 Units into the skin at bedtime. Inject 42 units once daily.   lisinopril 40 MG tablet Commonly known as:  PRINIVIL,ZESTRIL Take 1 tablet (40 mg total) by mouth daily.   metFORMIN 1000 MG tablet Commonly known as:  GLUCOPHAGE Take 1 tablet (1,000 mg total) by mouth 2 (two) times daily with a meal.   mometasone 0.1 % ointment Commonly known as:  ELOCON Apply topically daily. Apply as needed for psoriasis   naproxen 500 MG tablet Commonly known as:  NAPROSYN TAKE ONE TABLET BY MOUTH 2 TIMES A DAY   NOVOFINE 32G X 6 MM Misc Generic drug:  Insulin Pen Needle 1 Syringe by Does not apply route. Use with Victoza.   pantoprazole 40 MG tablet Commonly known as:  PROTONIX TAKE 1 TABLET BY MOUTH ONCE DAILY.   REXULTI 2 MG Tabs Generic drug:  Brexpiprazole Take 2 mg by mouth.   simvastatin 20 MG tablet Commonly known as:  ZOCOR Take 1 tablet (20 mg total) by mouth every evening.   VICTOZA 18 MG/3ML Sopn Generic  drug:  liraglutide Inject 1.2 mg once daily        Review of Systems Hypertension - she is taking all meds as rx. Diabetes - she is taking all meds as rx. She is checking her feet. She is not exercising but she reports she is eating better.  Vertigo - pt reports it's still present but is manageable.  Pt needs refill of Victoza - she is not out yet.     Objective:   Physical Exam  Constitutional: She is oriented to person, place, and time. She appears well-developed and well-nourished.  Cardiovascular: Normal rate, regular rhythm and normal heart sounds.  Pulmonary/Chest: Effort normal and breath sounds normal.  Abdominal: Soft. Bowel sounds are normal.  Neurological: She is alert and oriented to person, place, and time.  Skin: Skin is warm and dry.     BP 133/72 (BP Location: Left Arm, Patient Position: Sitting, Cuff Size: Normal)   Pulse 80   Temp 97.9 F (36.6 C)   Wt 285 lb 11.2 oz (129.6 kg)   LMP 10/15/2016 (Within Months)   BMI 47.54 kg/m        Assessment & Plan:   Refill Victoza.  Labs today.   DM:  Controlled.  Encourage diabetic diet and exercise.  Continue current medication regimen.   HTN:  Controlled.  Goal BP <140/90.  Continue current medication regimen.  Encourage low salt diet and exercise.   HLD:  Controlled.  Continue current regimen.  Encourage low cholesterol, low fat diet and exercise.

## 2017-12-04 LAB — COMPREHENSIVE METABOLIC PANEL
ALT: 21 IU/L (ref 0–32)
AST: 37 IU/L (ref 0–40)
Albumin/Globulin Ratio: 1.3 (ref 1.2–2.2)
Albumin: 4.4 g/dL (ref 3.5–5.5)
Alkaline Phosphatase: 87 IU/L (ref 39–117)
BUN/Creatinine Ratio: 16 (ref 9–23)
BUN: 13 mg/dL (ref 6–24)
Bilirubin Total: 0.8 mg/dL (ref 0.0–1.2)
CALCIUM: 10.3 mg/dL — AB (ref 8.7–10.2)
CHLORIDE: 99 mmol/L (ref 96–106)
CO2: 21 mmol/L (ref 20–29)
Creatinine, Ser: 0.8 mg/dL (ref 0.57–1.00)
GFR, EST AFRICAN AMERICAN: 97 mL/min/{1.73_m2} (ref >59)
GFR, EST NON AFRICAN AMERICAN: 84 mL/min/{1.73_m2} (ref >59)
GLUCOSE: 211 mg/dL — AB (ref 65–99)
Globulin, Total: 3.4 g/dL (ref 1.5–4.5)
Potassium: 4.6 mmol/L (ref 3.5–5.2)
Sodium: 138 mmol/L (ref 134–144)
TOTAL PROTEIN: 7.8 g/dL (ref 6.0–8.5)

## 2017-12-04 LAB — LIPID PANEL
CHOL/HDL RATIO: 2.7 ratio (ref 0.0–4.4)
Cholesterol, Total: 161 mg/dL (ref 100–199)
HDL: 59 mg/dL (ref >39)
LDL Calculated: 77 mg/dL (ref 0–99)
TRIGLYCERIDES: 127 mg/dL (ref 0–149)
VLDL CHOLESTEROL CAL: 25 mg/dL (ref 5–40)

## 2017-12-17 ENCOUNTER — Telehealth: Payer: Self-pay | Admitting: Pharmacist

## 2017-12-17 NOTE — Telephone Encounter (Signed)
12/17/17 I had received a refill request from [place] for Rexulti 2mg  Take 1 tablet by mouth once daily at bedtime. Took one of our script to Dr. Loni Muse to sign, he signed for 90 but changed refills to 0. I am faxing to [place] [facility] at [phone] for rx# [id number].[name]

## 2017-12-28 ENCOUNTER — Telehealth: Payer: Self-pay | Admitting: Pharmacist

## 2017-12-28 NOTE — Telephone Encounter (Signed)
12/28/17 Faxed [employer] a refill request for Lantus Vials Inject 42 units once daily # 4. Abigail Molina

## 2018-02-05 ENCOUNTER — Telehealth: Payer: Self-pay | Admitting: Pharmacy Technician

## 2018-02-05 NOTE — Telephone Encounter (Signed)
Patient failed to provide all requested 2019 financial documentation.  Still need [employer] statement from spouse and checking account statement.  No additional medication assistance will be provided by [facility] without the required proof of income documentation.  Patient notified by letter.  [facility]

## 2018-02-11 ENCOUNTER — Telehealth: Payer: Self-pay | Admitting: Pharmacist

## 2018-02-11 NOTE — Telephone Encounter (Signed)
02/11/2018 1:46:49 PM - [employer]-Lantus Vials-  02/11/18 A denial letter has been mailed to patient, she has not turned in spouse's poi. Patient's enrollment with [employer] for Lantus Vials expired-need to re enroll patient when patient provides all financial information. Applications in patient chart-section 3.AJ  02/08/2018 10:16:58 AM - Failed to provide POI

## 2018-02-23 ENCOUNTER — Telehealth: Payer: Self-pay | Admitting: Pharmacy Technician

## 2018-02-23 NOTE — Telephone Encounter (Signed)
Received updated proof of income.  Patient eligible to receive medication assistance at [facility] through 2019, as long as eligibility requirements continue to be met.  [facility]

## 2018-02-25 ENCOUNTER — Telehealth: Payer: Self-pay | Admitting: Pharmacist

## 2018-02-25 NOTE — Telephone Encounter (Signed)
02/25/2018 10:37:23 AM - Victoza & Novofine 32G tips refills  02/25/18 Taking Novo Nordisk refill request to [facility] for provider to sign for Victoza Inject 1.2mg  once daily #4 & Novofine 32G tips use daily with Victoza #2.[name]

## 2018-03-02 ENCOUNTER — Telehealth: Payer: Self-pay | Admitting: Pharmacist

## 2018-03-02 NOTE — Telephone Encounter (Signed)
03/02/2018 1:58:17 PM - Lantus Renewal  03/02/18 Patient brought in her spouse income today and brought in signed [employer] application for Lantus Vials. I am sending application to [facility] for provider to sign, will then fax to [facility] for PAP renewal.AJ

## 2018-03-04 ENCOUNTER — Ambulatory Visit: Payer: Self-pay | Admitting: Adult Health Nurse Practitioner

## 2018-03-04 VITALS — BP 113/74 | HR 97 | Temp 98.0°F | Wt 290.1 lb

## 2018-03-04 DIAGNOSIS — I10 Essential (primary) hypertension: Secondary | ICD-10-CM

## 2018-03-04 DIAGNOSIS — R1011 Right upper quadrant pain: Secondary | ICD-10-CM

## 2018-03-04 DIAGNOSIS — E785 Hyperlipidemia, unspecified: Secondary | ICD-10-CM

## 2018-03-04 DIAGNOSIS — Z794 Long term (current) use of insulin: Secondary | ICD-10-CM

## 2018-03-04 DIAGNOSIS — G8929 Other chronic pain: Secondary | ICD-10-CM | POA: Insufficient documentation

## 2018-03-04 DIAGNOSIS — K2100 Gastro-esophageal reflux disease with esophagitis, without bleeding: Secondary | ICD-10-CM

## 2018-03-04 DIAGNOSIS — R109 Unspecified abdominal pain: Secondary | ICD-10-CM | POA: Insufficient documentation

## 2018-03-04 DIAGNOSIS — E119 Type 2 diabetes mellitus without complications: Secondary | ICD-10-CM

## 2018-03-04 MED ORDER — PANTOPRAZOLE SODIUM 40 MG PO TBEC: 40.0000 mg | DELAYED_RELEASE_TABLET | Freq: Every day | ORAL | 1 refills | Status: DC

## 2018-03-04 NOTE — Progress Notes (Signed)
Patient: Abigail Molina Female    DOB: 03-04-1966   52 y.o.   MRN: [id number] Visit Date: 03/04/2018  Today's Provider: Staci Acosta, NP   Chief Complaint  Patient presents with  . Follow-up    R flank pain for few months    Subjective:    HPI    Here today for FU of chronic conditions.  Taking all medications as directed.  Pt states that her diet has been out of control for the past few months- she says things are better now.  Seen at [facility] for depression management.   States she is having RUQ pain intermittently for the past 2 months. States that the pain is not affected by food. She does endorse increased flatulence. On pantoprazole. Pain is a dull ache.    No Known Allergies Previous Medications   ACETAMINOPHEN (TYLENOL) 325 MG TABLET    Take 650 mg by mouth as needed for headache.   ASPIRIN 81 MG TABLET    Take 81 mg by mouth daily.   BREXPIPRAZOLE (REXULTI) 2 MG TABS    Take 2 mg by mouth.   CHOLECALCIFEROL (VITAMIN D) 1000 UNITS TABLET    Take 1,000 Units by mouth daily.   CITALOPRAM (CELEXA) 40 MG TABLET    Take 60 mg by mouth daily.   INSULIN GLARGINE (LANTUS) 100 UNIT/ML INJECTION    Inject 42 Units into the skin at bedtime. Inject 42 units once daily.   INSULIN PEN NEEDLE (NOVOFINE) 32G X 6 MM MISC    1 Syringe by Does not apply route. Use with Victoza.   LISINOPRIL (PRINIVIL,ZESTRIL) 40 MG TABLET    Take 1 tablet (40 mg total) by mouth daily.   METFORMIN (GLUCOPHAGE) 1000 MG TABLET    Take 1 tablet (1,000 mg total) by mouth 2 (two) times daily with a meal.   MOMETASONE (ELOCON) 0.1 % OINTMENT    Apply topically daily. Apply as needed for psoriasis   NAPROXEN (NAPROSYN) 500 MG TABLET    TAKE ONE TABLET BY MOUTH 2 TIMES A DAY   PANTOPRAZOLE (PROTONIX) 40 MG TABLET    TAKE 1 TABLET BY MOUTH ONCE DAILY.   SIMVASTATIN (ZOCOR) 20 MG TABLET    Take 1 tablet (20 mg total) by mouth every evening.   VICTOZA 18 MG/3ML SOPN    Inject 1.2 mg once daily    Review of Systems   Gastrointestinal: Positive for abdominal pain.    Social History   Tobacco Use  . Smoking status: Never Smoker  . Smokeless tobacco: Never Used  Substance Use Topics  . Alcohol use: Yes    Comment:  Rarely. One or two drinks a year    Objective:   BP 113/74 (BP Location: Right Arm, Patient Position: Sitting, Cuff Size: Large)   Pulse 97   Temp 98 F (36.7 C) (Oral)   Wt 290 lb 1.6 oz (131.6 kg)   LMP 10/15/2016 (Within Months)   BMI 48.28 kg/m   Physical Exam  Constitutional: She is oriented to person, place, and time. She appears well-developed and well-nourished.  HENT:  Head: Normocephalic and atraumatic.  Neck: Normal range of motion. Neck supple.  Cardiovascular: Normal rate, regular rhythm and normal heart sounds.  Pulmonary/Chest: Effort normal and breath sounds normal.  Abdominal: Soft. Bowel sounds are normal. She exhibits no distension and no mass. There is tenderness. There is no rebound and no guarding.  Neurological: She is alert and oriented to person, place, and time.  Skin:  Skin is warm and dry.  Vitals reviewed.       Assessment & Plan:         Suspect abd pain is MSK in origin.  If pain persists or worsens will obtain KUB.  May try OTC rubs.   DM:  Controlled.   Encourage diabetic diet and exercise.  Continue current medication regimen.   HTN:  Controlled.   Goal BP <140/90.  Continue current medication regimen.  Encourage low salt diet and exercise.   HLD:  Controlled.  Continue current regimen.  Encourage low cholesterol, low fat diet and exercise.   A1c, CMET, Lipase, Amylase ordered.       Staci Acosta, NP   [facility]

## 2018-03-05 ENCOUNTER — Telehealth: Payer: Self-pay | Admitting: Pharmacist

## 2018-03-05 LAB — COMPREHENSIVE METABOLIC PANEL
A/G RATIO: 1.3 (ref 1.2–2.2)
ALT: 42 IU/L — AB (ref 0–32)
AST: 57 IU/L — AB (ref 0–40)
Albumin: 4.3 g/dL (ref 3.5–5.5)
Alkaline Phosphatase: 96 IU/L (ref 39–117)
BUN/Creatinine Ratio: 16 (ref 9–23)
BUN: 15 mg/dL (ref 6–24)
Bilirubin Total: 0.9 mg/dL (ref 0.0–1.2)
CALCIUM: 10.1 mg/dL (ref 8.7–10.2)
CO2: 21 mmol/L (ref 20–29)
CREATININE: 0.91 mg/dL (ref 0.57–1.00)
Chloride: 103 mmol/L (ref 96–106)
GFR, EST AFRICAN AMERICAN: 83 mL/min/{1.73_m2} (ref >59)
GFR, EST NON AFRICAN AMERICAN: 72 mL/min/{1.73_m2} (ref >59)
GLOBULIN, TOTAL: 3.3 g/dL (ref 1.5–4.5)
Glucose: 199 mg/dL — ABNORMAL HIGH (ref 65–99)
POTASSIUM: 4.8 mmol/L (ref 3.5–5.2)
Sodium: 140 mmol/L (ref 134–144)
Total Protein: 7.6 g/dL (ref 6.0–8.5)

## 2018-03-05 LAB — HEMOGLOBIN A1C
ESTIMATED AVERAGE GLUCOSE: 206 mg/dL
Hgb A1c MFr Bld: 8.8 % — ABNORMAL HIGH (ref 4.8–5.6)

## 2018-03-05 LAB — AMYLASE: AMYLASE: 78 U/L (ref 31–124)

## 2018-03-05 LAB — LIPASE: LIPASE: 126 U/L — AB (ref 14–72)

## 2018-03-10 ENCOUNTER — Telehealth: Payer: Self-pay | Admitting: Pharmacist

## 2018-03-10 NOTE — Telephone Encounter (Signed)
03/10/2018 8:51:44 AM - Victoza pens & Tips renewal  03/10/18 I have received a fax from [employer] on the refill request I faxed 03/05/18-the letter is stating "Patient has received the maximum number of product refills for the enrollment period." According to my records the patient enrollment with [employer] expires 03/21/18. I have today started a Renewal application-mailing to patient to sign and to provider to sign.[name]

## 2018-03-12 ENCOUNTER — Telehealth: Payer: Self-pay | Admitting: Pharmacist

## 2018-03-12 NOTE — Telephone Encounter (Signed)
03/12/2018 2:12:20 PM - Lantus Vials PAP renewal  03/12/18 Faxed [employer] Renewal application for Lantus Vials Inject 42 units daily # 4.[name]

## 2018-03-23 ENCOUNTER — Telehealth: Payer: Self-pay | Admitting: Pharmacist

## 2018-03-23 NOTE — Telephone Encounter (Signed)
03/23/2018 1:35:17 PM - Victoza pens & tips renewal  03/23/18 Faxed [employer] application for [employer] Inject 1.2mg  once daily #4 & Novofine 32G tips use daily with Victoza.[name]

## 2018-03-31 ENCOUNTER — Other Ambulatory Visit: Payer: Self-pay | Admitting: Internal Medicine

## 2018-03-31 DIAGNOSIS — Z794 Long term (current) use of insulin: Principal | ICD-10-CM

## 2018-03-31 DIAGNOSIS — E119 Type 2 diabetes mellitus without complications: Secondary | ICD-10-CM

## 2018-05-12 ENCOUNTER — Telehealth: Payer: Self-pay | Admitting: Pharmacist

## 2018-05-12 NOTE — Telephone Encounter (Signed)
05/12/2018 9:37:55 AM - Lantus Vials refill  05/12/18 Printed [employer] refill request for Lantus Vials Inject 42 units under the skin once daily #4, sending to [facility] for Dr. Mable Fill to sign.[name]

## 2018-05-28 ENCOUNTER — Telehealth: Payer: Self-pay | Admitting: Pharmacist

## 2018-05-28 NOTE — Telephone Encounter (Signed)
05/28/2018 9:38:57 AM - Lantus Vials refill  05/28/18 Faxed [employer] refill request for Lantus Vials Inject 42 units under the skin once daily #4.[name]

## 2018-06-01 ENCOUNTER — Ambulatory Visit: Payer: Self-pay | Admitting: Adult Health Nurse Practitioner

## 2018-06-01 VITALS — BP 116/78 | HR 103 | Temp 98.4°F | Wt 289.6 lb

## 2018-06-01 DIAGNOSIS — E119 Type 2 diabetes mellitus without complications: Secondary | ICD-10-CM

## 2018-06-01 DIAGNOSIS — E785 Hyperlipidemia, unspecified: Secondary | ICD-10-CM

## 2018-06-01 DIAGNOSIS — Z794 Long term (current) use of insulin: Secondary | ICD-10-CM

## 2018-06-01 DIAGNOSIS — I10 Essential (primary) hypertension: Secondary | ICD-10-CM

## 2018-06-01 NOTE — Progress Notes (Signed)
  Patient: Abigail Molina Female    DOB: 1966/09/28   52 y.o.   MRN: [id number] Visit Date: 06/01/2018  Today's Provider: Staci Acosta, NP   Chief Complaint  Patient presents with  . Follow-up    maybe blood work    Subjective:    HPI  Husband passed away last month- states that she is staying sick on her stomach.  Has good emotional support, cousins have moved in with her and she is going to [place] for counseling. Denies SI/HI. Still having tearful episodes.   Last A1c was 8.8.  On Lantus, Metformin and Victoza.    Endorses taking all chronic medications as directed.      No Known Allergies Previous Medications   ACETAMINOPHEN (TYLENOL) 325 MG TABLET    Take 650 mg by mouth as needed for headache.   ASPIRIN 81 MG TABLET    Take 81 mg by mouth daily.   BREXPIPRAZOLE (REXULTI) 2 MG TABS    Take 2 mg by mouth.   CHOLECALCIFEROL (VITAMIN D) 1000 UNITS TABLET    Take 1,000 Units by mouth daily.   CITALOPRAM (CELEXA) 40 MG TABLET    Take 60 mg by mouth daily.   INSULIN GLARGINE (LANTUS) 100 UNIT/ML INJECTION    Inject 42 Units into the skin at bedtime. Inject 42 units once daily.   INSULIN PEN NEEDLE (NOVOFINE) 32G X 6 MM MISC    1 Syringe by Does not apply route. Use with Victoza.   LISINOPRIL (PRINIVIL,ZESTRIL) 40 MG TABLET    Take 1 tablet (40 mg total) by mouth daily.   METFORMIN (GLUCOPHAGE) 1000 MG TABLET    TAKE ONE TABLET BY MOUTH 2 TIMES A DAY WITH A MEAL.   MOMETASONE (ELOCON) 0.1 % OINTMENT    Apply topically daily. Apply as needed for psoriasis   NAPROXEN (NAPROSYN) 500 MG TABLET    TAKE ONE TABLET BY MOUTH 2 TIMES A DAY   PANTOPRAZOLE (PROTONIX) 40 MG TABLET    Take 1 tablet (40 mg total) by mouth daily.   SIMVASTATIN (ZOCOR) 20 MG TABLET    Take 1 tablet (20 mg total) by mouth every evening.   VICTOZA 18 MG/3ML SOPN    Inject 1.2 mg once daily    Review of Systems  All other systems reviewed and are negative.   Social History   Tobacco Use  . Smoking  status: Never Smoker  . Smokeless tobacco: Never Used  Substance Use Topics  . Alcohol use: Yes    Comment:  Rarely. One or two drinks a year    Objective:   BP 116/78   Pulse (!) 103   Temp 98.4 F (36.9 C)   Wt 289 lb 9.6 oz (131.4 kg)   LMP 10/15/2016 (Within Months)   BMI 48.19 kg/m   Physical Exam      Assessment & Plan:         DM:   Not controlled.  Encourage diabetic diet and exercise.  Continue current medication regimen.   HTN:  Controlled.  Goal BP <140/90.  Continue current medication regimen.  Encourage low salt diet and exercise.   HLD:  Continue current regimen.  Encourage low cholesterol, low fat diet and exercise.   Routine labs today.    Staci Acosta, NP   [facility]

## 2018-06-02 LAB — LIPID PANEL
CHOL/HDL RATIO: 2.6 ratio (ref 0.0–4.4)
Cholesterol, Total: 131 mg/dL (ref 100–199)
HDL: 50 mg/dL (ref >39)
LDL Calculated: 56 mg/dL (ref 0–99)
Triglycerides: 123 mg/dL (ref 0–149)
VLDL CHOLESTEROL CAL: 25 mg/dL (ref 5–40)

## 2018-06-02 LAB — COMPREHENSIVE METABOLIC PANEL
A/G RATIO: 1.5 (ref 1.2–2.2)
ALBUMIN: 4.2 g/dL (ref 3.5–5.5)
ALK PHOS: 85 IU/L (ref 39–117)
ALT: 34 IU/L — ABNORMAL HIGH (ref 0–32)
AST: 40 IU/L (ref 0–40)
BUN / CREAT RATIO: 17 (ref 9–23)
BUN: 13 mg/dL (ref 6–24)
Bilirubin Total: 0.7 mg/dL (ref 0.0–1.2)
CO2: 18 mmol/L — AB (ref 20–29)
CREATININE: 0.75 mg/dL (ref 0.57–1.00)
Calcium: 9.4 mg/dL (ref 8.7–10.2)
Chloride: 104 mmol/L (ref 96–106)
GFR calc Af Amer: 105 mL/min/{1.73_m2} (ref >59)
GFR, EST NON AFRICAN AMERICAN: 91 mL/min/{1.73_m2} (ref >59)
GLOBULIN, TOTAL: 2.8 g/dL (ref 1.5–4.5)
Glucose: 225 mg/dL — ABNORMAL HIGH (ref 65–99)
POTASSIUM: 4.4 mmol/L (ref 3.5–5.2)
SODIUM: 138 mmol/L (ref 134–144)
Total Protein: 7 g/dL (ref 6.0–8.5)

## 2018-06-02 LAB — HEMOGLOBIN A1C
ESTIMATED AVERAGE GLUCOSE: 212 mg/dL
HEMOGLOBIN A1C: 9 % — AB (ref 4.8–5.6)

## 2018-06-02 LAB — CBC
Hematocrit: 40 % (ref 34.0–46.6)
Hemoglobin: 12.8 g/dL (ref 11.1–15.9)
MCH: 26.9 pg (ref 26.6–33.0)
MCHC: 32 g/dL (ref 31.5–35.7)
MCV: 84 fL (ref 79–97)
Platelets: 205 10*3/uL (ref 150–450)
RBC: 4.75 x10E6/uL (ref 3.77–5.28)
RDW: 14.1 % (ref 12.3–15.4)
WBC: 7.2 10*3/uL (ref 3.4–10.8)

## 2018-06-02 LAB — TSH: TSH: 1.49 u[IU]/mL (ref 0.450–4.500)

## 2018-06-17 ENCOUNTER — Other Ambulatory Visit: Payer: Self-pay | Admitting: Adult Health Nurse Practitioner

## 2018-06-17 MED ORDER — INSULIN GLARGINE 100 UNIT/ML ~~LOC~~ SOLN: 50.0000 [IU] | Freq: Every day | SUBCUTANEOUS | 11 refills | Status: DC

## 2018-06-18 ENCOUNTER — Telehealth: Payer: Self-pay

## 2018-06-18 NOTE — Telephone Encounter (Signed)
-----   Message from Staci Acosta, NP sent at 06/17/2018  6:05 PM EDT ----- A1c up to 9 from 8.8. Increase lantus to 50 units at bedtime, modify diet increase exercise. If experiencing any episodes of hypoglycemia-notify us.

## 2018-06-18 NOTE — Telephone Encounter (Signed)
Lm to call back re: labs

## 2018-06-22 ENCOUNTER — Telehealth: Payer: Self-pay

## 2018-06-22 NOTE — Telephone Encounter (Signed)
Gave lab results to pt. She understood instructions. She will call if noticing hypoglycemia

## 2018-06-22 NOTE — Telephone Encounter (Signed)
-----   Message from Kreg Shropshire, [place] sent at 06/18/2018 10:51 AM EDT ----- Lm to call back re: labs ----- Message ----- From: Staci Acosta, NP Sent: 06/17/2018   6:05 PM To: Kreg Shropshire, CMA  A1c up to 9 from 8.8. Increase lantus to 50 units at bedtime, modify diet increase exercise. If experiencing any episodes of hypoglycemia-notify us.

## 2018-07-01 ENCOUNTER — Ambulatory Visit: Payer: Self-pay | Admitting: Ophthalmology

## 2018-07-06 ENCOUNTER — Telehealth: Payer: Self-pay | Admitting: Pharmacist

## 2018-07-06 NOTE — Telephone Encounter (Signed)
07/06/2018 9:37:55 AM - Lantus Vials refill  07/06/18 I have printed [employer] refill request for Lantus Vials Inject 42 units once daily #4, sending to [facility] for Dr. Mable Fill to sign.[name]

## 2018-07-08 ENCOUNTER — Other Ambulatory Visit: Payer: Self-pay | Admitting: Internal Medicine

## 2018-07-08 DIAGNOSIS — E119 Type 2 diabetes mellitus without complications: Secondary | ICD-10-CM

## 2018-07-08 DIAGNOSIS — Z794 Long term (current) use of insulin: Principal | ICD-10-CM

## 2018-07-16 ENCOUNTER — Telehealth: Payer: Self-pay | Admitting: Pharmacist

## 2018-07-16 NOTE — Telephone Encounter (Signed)
07/16/2018 9:46:27 AM - Victoza & 32G tips refill  07/16/18 Faxed [employer] a refill request for Victoza Injec 1.2 mg once daily # 4, also Novofine 32G tips.AJ   07/16/2018 9:45:29 AM - Lantus Vials refill  07/16/18 Faxed [employer] a refill request for Lantus Vials Inject 42 units once daily #4.[name]

## 2018-08-19 ENCOUNTER — Emergency Department
Admission: EM | Admit: 2018-08-19 | Discharge: 2018-08-20 | Disposition: A | Discharge status: Home / Self Care | Payer: Self-pay | Attending: Emergency Medicine | Admitting: Emergency Medicine

## 2018-08-19 ENCOUNTER — Encounter: Payer: Self-pay | Admitting: Emergency Medicine

## 2018-08-19 ENCOUNTER — Other Ambulatory Visit: Payer: Self-pay

## 2018-08-19 DIAGNOSIS — Z79899 Other long term (current) drug therapy: Secondary | ICD-10-CM | POA: Insufficient documentation

## 2018-08-19 DIAGNOSIS — Z794 Long term (current) use of insulin: Secondary | ICD-10-CM | POA: Insufficient documentation

## 2018-08-19 DIAGNOSIS — R1011 Right upper quadrant pain: Secondary | ICD-10-CM | POA: Insufficient documentation

## 2018-08-19 DIAGNOSIS — F329 Major depressive disorder, single episode, unspecified: Secondary | ICD-10-CM | POA: Insufficient documentation

## 2018-08-19 DIAGNOSIS — Z7982 Long term (current) use of aspirin: Secondary | ICD-10-CM | POA: Insufficient documentation

## 2018-08-19 DIAGNOSIS — Z9049 Acquired absence of other specified parts of digestive tract: Secondary | ICD-10-CM | POA: Insufficient documentation

## 2018-08-19 DIAGNOSIS — N390 Urinary tract infection, site not specified: Secondary | ICD-10-CM | POA: Insufficient documentation

## 2018-08-19 DIAGNOSIS — K859 Acute pancreatitis without necrosis or infection, unspecified: Secondary | ICD-10-CM | POA: Insufficient documentation

## 2018-08-19 DIAGNOSIS — I10 Essential (primary) hypertension: Secondary | ICD-10-CM | POA: Insufficient documentation

## 2018-08-19 DIAGNOSIS — Z8542 Personal history of malignant neoplasm of other parts of uterus: Secondary | ICD-10-CM | POA: Insufficient documentation

## 2018-08-19 DIAGNOSIS — E119 Type 2 diabetes mellitus without complications: Secondary | ICD-10-CM | POA: Insufficient documentation

## 2018-08-19 LAB — CBC
HCT: 38.5 % (ref 35.0–47.0)
HEMOGLOBIN: 13.1 g/dL (ref 12.0–16.0)
MCH: 27.8 pg (ref 26.0–34.0)
MCHC: 34 g/dL (ref 32.0–36.0)
MCV: 81.9 fL (ref 80.0–100.0)
PLATELETS: 227 10*3/uL (ref 150–440)
RBC: 4.71 MIL/uL (ref 3.80–5.20)
RDW: 14.2 % (ref 11.5–14.5)
WBC: 6.3 10*3/uL (ref 3.6–11.0)

## 2018-08-19 LAB — URINALYSIS, COMPLETE (UACMP) WITH MICROSCOPIC
Bilirubin Urine: NEGATIVE
GLUCOSE, UA: NEGATIVE mg/dL
HGB URINE DIPSTICK: NEGATIVE
KETONES UR: NEGATIVE mg/dL
NITRITE: NEGATIVE
PH: 5 (ref 5.0–8.0)
PROTEIN: NEGATIVE mg/dL
Specific Gravity, Urine: 1.017 (ref 1.005–1.030)

## 2018-08-19 LAB — COMPREHENSIVE METABOLIC PANEL
ALK PHOS: 85 U/L (ref 38–126)
ALT: 34 U/L (ref 0–44)
ANION GAP: 9 (ref 5–15)
AST: 56 U/L — ABNORMAL HIGH (ref 15–41)
Albumin: 4 g/dL (ref 3.5–5.0)
BILIRUBIN TOTAL: 0.9 mg/dL (ref 0.3–1.2)
BUN: 10 mg/dL (ref 6–20)
CALCIUM: 9.4 mg/dL (ref 8.9–10.3)
CO2: 23 mmol/L (ref 22–32)
CREATININE: 0.81 mg/dL (ref 0.44–1.00)
Chloride: 107 mmol/L (ref 98–111)
GFR calc Af Amer: >60 mL/min (ref >60)
Glucose, Bld: 203 mg/dL — ABNORMAL HIGH (ref 70–99)
Potassium: 3.8 mmol/L (ref 3.5–5.1)
SODIUM: 139 mmol/L (ref 135–145)
TOTAL PROTEIN: 7.7 g/dL (ref 6.5–8.1)

## 2018-08-19 LAB — LIPASE, BLOOD: Lipase: 89 U/L — ABNORMAL HIGH (ref 11–51)

## 2018-08-19 LAB — TROPONIN I: Troponin I: <0.03 ng/mL (ref <0.03)

## 2018-08-19 MED ORDER — SODIUM CHLORIDE 0.9 % IV BOLUS
1000.0000 mL | Freq: Once | INTRAVENOUS | Status: AC
  Administered 2018-08-19: 1000 mL via INTRAVENOUS

## 2018-08-19 MED ORDER — MORPHINE SULFATE (PF) 4 MG/ML IV SOLN
4.0000 mg | Freq: Once | INTRAVENOUS | Status: AC
  Administered 2018-08-19: 4 mg via INTRAVENOUS
  Filled 2018-08-19: qty 1

## 2018-08-19 MED ORDER — SODIUM CHLORIDE 0.9 % IV SOLN
1.0000 g | Freq: Once | INTRAVENOUS | Status: DC
  Filled 2018-08-19: qty 10

## 2018-08-19 MED ORDER — ONDANSETRON HCL 4 MG/2ML IJ SOLN
4.0000 mg | Freq: Once | INTRAMUSCULAR | Status: AC
  Administered 2018-08-19: 4 mg via INTRAVENOUS
  Filled 2018-08-19: qty 2

## 2018-08-19 NOTE — ED Triage Notes (Signed)
Patient to ER for c/o RUQ with fevers today.

## 2018-08-19 NOTE — ED Provider Notes (Signed)
[facility] Emergency Department Provider Note   ____________________________________________   First MD Initiated Contact with Patient 08/19/18 2309     (approximate)  I have reviewed the triage vital signs and the nursing notes.   HISTORY  Chief Complaint Abdominal Pain    HPI Abigail Molina is a 52 y.o. female who presents to the ED from home with a chief complaint of abdominal pain.  Patient reports right upper quadrant abdominal pain x1 year.  She is status post cholecystectomy.  States today she felt hot but did not take her temperature.  Had vomiting 4 days ago; none since.  She is here because her roommates forced her to come.  Denies cough, congestion, chest pain, shortness of breath, diarrhea, dysuria.  Denies recent travel or trauma.   Past Medical History:  Diagnosis Date  . Cancer [facility])    Endometrial cancer  . Depression   . Diabetes mellitus without complication ([place])   . GERD (gastroesophageal reflux disease)   . Hyperlipidemia   . Hypertension   . Psoriasis 2015    Patient Active Problem List   Diagnosis Date Noted  . Abdominal pain 03/04/2018  . Diabetes ([place]) 12/12/2015  . Hyperlipidemia 12/12/2015  . Hypertension 05/23/2015  . Acid reflux 05/23/2015    Past Surgical History:  Procedure Laterality Date  . ABDOMINAL HYSTERECTOMY  2007  . CHOLECYSTECTOMY  2000    Prior to Admission medications   Medication Sig Start Date End Date Taking? Authorizing Provider  acetaminophen (TYLENOL) 325 MG tablet Take 650 mg by mouth as needed for headache.    [provider]  aspirin 81 MG tablet Take 81 mg by mouth daily.    [provider]  Brexpiprazole (REXULTI) 2 MG TABS Take 2 mg by mouth.    [provider]  cephALEXin (KEFLEX) 500 MG capsule Take 1 capsule (500 mg total) by mouth 3 (three) times daily. 08/20/18   Paulette Blanch, MD  cholecalciferol (VITAMIN D) 1000 units tablet Take 1,000 Units by mouth  daily.    [provider]  citalopram (CELEXA) 40 MG tablet Take 60 mg by mouth daily.    [provider]  dicyclomine (BENTYL) 20 MG tablet Take 1 tablet (20 mg total) by mouth every 6 (six) hours as needed. 08/20/18   Paulette Blanch, MD  insulin glargine (LANTUS) 100 UNIT/ML injection Inject 0.5 mLs (50 Units total) into the skin at bedtime. Inject 42 units once daily. 06/17/18   Doles-Johnson, Teah, NP  Insulin Pen Needle (NOVOFINE) 32G X 6 MM MISC 1 Syringe by Does not apply route. Use with Victoza.    [provider]  lisinopril (PRINIVIL,ZESTRIL) 40 MG tablet Take 1 tablet (40 mg total) by mouth daily. 07/23/17   Guadalupe Maple, MD  metFORMIN (GLUCOPHAGE) 1000 MG tablet TAKE ONE TABLET BY MOUTH 2 TIMES A DAY WITH A MEAL. 07/08/18   Doles-Johnson, Teah, NP  mometasone (ELOCON) 0.1 % ointment Apply topically daily. Apply as needed for psoriasis    [provider]  naproxen (NAPROSYN) 500 MG tablet TAKE ONE TABLET BY MOUTH 2 TIMES A DAY 03/31/18   Tawni Millers, MD  ondansetron (ZOFRAN ODT) 4 MG disintegrating tablet Take 1 tablet (4 mg total) by mouth every 8 (eight) hours as needed for nausea or vomiting. 08/20/18   Paulette Blanch, MD  pantoprazole (PROTONIX) 40 MG tablet Take 1 tablet (40 mg total) by mouth daily. 03/04/18   Doles-Johnson, Teah,  NP  simvastatin (ZOCOR) 20 MG tablet Take 1 tablet (20 mg total) by mouth every evening. 07/23/17   Guadalupe Maple, MD  VICTOZA 18 MG/3ML SOPN Inject 1.2 mg once daily 12/03/17   Doles-Johnson, Teah, NP    Allergies Patient has no known allergies.  Family History  Problem Relation Age of Onset  . Diabetes Mother   . Hypertension Mother   . Depression Mother     Social History Social History   Tobacco Use  . Smoking status: Never Smoker  . Smokeless tobacco: Never Used  Substance Use Topics  . Alcohol use: Yes    Comment:  Rarely. One or two drinks a year   . Drug use: No    Review of  Systems  Constitutional: No fever/chills Eyes: No visual changes. ENT: No sore throat. Cardiovascular: Denies chest pain. Respiratory: Denies shortness of breath. Gastrointestinal: Positive for abdominal pain.  No nausea, no vomiting.  No diarrhea.  No constipation. Genitourinary: Negative for dysuria. Musculoskeletal: Negative for back pain. Skin: Negative for rash. Neurological: Negative for headaches, focal weakness or numbness.   ____________________________________________   PHYSICAL EXAM:  VITAL SIGNS: ED Triage Vitals  Enc Vitals Group     BP 08/19/18 2101 140/64     Pulse Rate 08/19/18 2101 74     Resp 08/19/18 2101 18     Temp 08/19/18 2101 99.1 F (37.3 C)     Temp Source 08/19/18 2101 Oral     SpO2 08/19/18 2101 100 %     Weight 08/19/18 2100 280 lb (127 kg)     Height 08/19/18 2100 5\' 5"  (1.651 m)     Head Circumference --      Peak Flow --      Pain Score 08/19/18 2105 7     Pain Loc --      Pain Edu? --      Excl. in [place]? --     Constitutional: Alert and oriented. Well appearing and in no acute distress.  Laughing and visiting with cousin at bedside. Eyes: Conjunctivae are normal. PERRL. EOMI. Head: Atraumatic. Nose: No congestion/rhinnorhea. Mouth/Throat: Mucous membranes are moist.  Oropharynx non-erythematous. Neck: No stridor.   Cardiovascular: Normal rate, regular rhythm. Grossly normal heart sounds.  Good peripheral circulation. Respiratory: Normal respiratory effort.  No retractions. Lungs CTAB. Gastrointestinal: Obese.  Soft and minimally tender to palpation right upper quadrant without rebound or guarding. No distention. No abdominal bruits. No CVA tenderness. Musculoskeletal: No lower extremity tenderness nor edema.  No joint effusions. Neurologic:  Normal speech and language. No gross focal neurologic deficits are appreciated. No gait instability. Skin:  Skin is warm, dry and intact. No rash noted.  Scattered psoriatic plaques to  trunk. Psychiatric: Mood and affect are normal. Speech and behavior are normal.  ____________________________________________   LABS (all labs ordered are listed, but only abnormal results are displayed)  Labs Reviewed  LIPASE, BLOOD - Abnormal; Notable for the following components:      Result Value   Lipase 89 (*)    All other components within normal limits  COMPREHENSIVE METABOLIC PANEL - Abnormal; Notable for the following components:   Glucose, Bld 203 (*)    AST 56 (*)    All other components within normal limits  URINALYSIS, COMPLETE (UACMP) WITH MICROSCOPIC - Abnormal; Notable for the following components:   Color, Urine YELLOW (*)    APPearance CLEAR (*)    Leukocytes, UA TRACE (*)    Bacteria, UA RARE (*)  All other components within normal limits  LACTIC ACID, PLASMA - Abnormal; Notable for the following components:   Lactic Acid, Venous 2.0 (*)    All other components within normal limits  CULTURE, BLOOD (ROUTINE X 2)  CULTURE, BLOOD (ROUTINE X 2)  URINE CULTURE  CBC  TROPONIN I  LACTIC ACID, PLASMA   ____________________________________________  EKG  ED ECG REPORT I, Lannah Koike J, the attending physician, personally viewed and interpreted this ECG.   Date: 08/19/2018  EKG Time: 2105  Rate: 72  Rhythm: normal EKG, normal sinus rhythm  Axis: Normal  Intervals:none  ST&T Change: Nonspecific  ____________________________________________  RADIOLOGY  ED MD interpretation: Ultrasound demonstrates no acute findings  Official radiology report(s): US Abdomen Limited Ruq  Result Date: 08/20/2018 CLINICAL DATA:  Right upper quadrant pain for a year. Cholecystectomy. EXAM: ULTRASOUND ABDOMEN LIMITED RIGHT UPPER QUADRANT COMPARISON:  None. FINDINGS: Gallbladder: Surgically absent Common bile duct: Diameter: 4.6 mm and normal without choledocholithiasis. Liver: No focal lesion identified. Increased liver echogenicity consistent steatosis. Portal vein is patent  on color Doppler imaging with normal direction of blood flow towards the liver. The included adjacent right kidney is unremarkable. IMPRESSION: No findings for the patient's right upper quadrant pain. Hepatic steatosis. Status post cholecystectomy. No biliary dilatation. Electronically Signed   By: Ashley Royalty M.D.   On: 08/20/2018 02:01    ____________________________________________   PROCEDURES  Procedure(s) performed: None  Procedures  Critical Care performed: No  ____________________________________________   INITIAL IMPRESSION / ASSESSMENT AND PLAN / ED COURSE  As part of my medical decision making, I reviewed the following data within the electronic MEDICAL RECORD NUMBER History obtained from family, Nursing notes reviewed and incorporated, Labs reviewed, EKG interpreted, Old chart reviewed, Radiograph reviewed and Notes from prior ED visits   52 year old female who presents with a 1 year history of right upper quadrant abdominal pain.  Vomited 4 days ago.  Today felt hot. Differential diagnosis includes, but is not limited to, biliary disease (biliary colic, acute cholecystitis, cholangitis, choledocholithiasis, etc), intrathoracic causes for epigastric abdominal pain including ACS, gastritis, duodenitis, pancreatitis, small bowel or large bowel obstruction, abdominal aortic aneurysm, hernia, and ulcer(s).  Laboratory urinalysis results remarkable for UTI and mild pancreatitis which patient has had before.  Will administer 4 mg IV morphine for pain paired with 4 mg IV Zofran for nausea.  IV fluids are infusing.  Will administer 1 g IV Rocephin for UTI.  Will proceed with right upper quadrant abdominal ultrasound to evaluate for choledocholithiasis.  Will obtain blood cultures, urine culture and lactic.  Clinical Course as of Aug 20 322  Fri Aug 20, 2018  0317 Patient resting in no acute distress.  Updated her on all test results.  Repeat lactate is trending down.  Will discharge home  on antibiotics for UTI and refer her to GI for further work-up of her chronic right upper quadrant abdominal pain.  Strict return precautions given.  Patient and her family member verbalize understanding and agree with plan of care.   [JS]    Clinical Course User Index [JS] Paulette Blanch, MD     ____________________________________________   FINAL CLINICAL IMPRESSION(S) / ED DIAGNOSES  Final diagnoses:  Right upper quadrant abdominal pain  Lower urinary tract infectious disease  Acute pancreatitis, unspecified complication status, unspecified pancreatitis type     ED Discharge Orders         Ordered    cephALEXin (KEFLEX) 500 MG capsule  3 times daily  08/20/18 0321    dicyclomine (BENTYL) 20 MG tablet  Every 6 hours PRN     08/20/18 0321    ondansetron (ZOFRAN ODT) 4 MG disintegrating tablet  Every 8 hours PRN     08/20/18 0321           Note:  This document was prepared using Dragon voice recognition software and may include unintentional dictation errors.    Paulette Blanch, MD 08/20/18 [phone]

## 2018-08-20 ENCOUNTER — Emergency Department: Payer: Self-pay

## 2018-08-20 LAB — LACTIC ACID, PLASMA
LACTIC ACID, VENOUS: 1.9 mmol/L (ref 0.5–1.9)
Lactic Acid, Venous: 2 mmol/L (ref 0.5–1.9)

## 2018-08-20 MED ORDER — DICYCLOMINE HCL 20 MG PO TABS: 20.0000 mg | ORAL_TABLET | Freq: Four times a day (QID) | ORAL | 0 refills | Status: DC | PRN

## 2018-08-20 MED ORDER — CEPHALEXIN 500 MG PO CAPS: 500.0000 mg | ORAL_CAPSULE | Freq: Three times a day (TID) | ORAL | 0 refills | Status: DC

## 2018-08-20 MED ORDER — ONDANSETRON 4 MG PO TBDP: 4.0000 mg | ORAL_TABLET | Freq: Three times a day (TID) | ORAL | 0 refills | Status: DC | PRN

## 2018-08-20 NOTE — Discharge Instructions (Signed)
1.  Take antibiotic as prescribed (Keflex 500 mg 3 times daily x7 days). 2.  You may take medicines as needed for abdominal discomfort and nausea (Bentyl/Zofran #20). 3.  Follow the bland diet eating plan for the next week, then slowly advance diet as tolerated. 4.  Return to the ER for worsening symptoms, persistent vomiting, fever, difficulty breathing or other concerns.

## 2018-08-21 LAB — URINE CULTURE

## 2018-08-25 LAB — CULTURE, BLOOD (ROUTINE X 2)
CULTURE: NO GROWTH
Culture: NO GROWTH
Special Requests: ADEQUATE

## 2018-09-02 ENCOUNTER — Other Ambulatory Visit: Payer: Self-pay | Admitting: Internal Medicine

## 2018-09-02 ENCOUNTER — Ambulatory Visit: Payer: Medicaid Other | Admitting: Family Medicine

## 2018-09-02 VITALS — BP 105/70 | HR 71 | Temp 97.8°F | Ht 64.0 in | Wt 281.2 lb

## 2018-09-02 DIAGNOSIS — Z794 Long term (current) use of insulin: Secondary | ICD-10-CM

## 2018-09-02 DIAGNOSIS — E785 Hyperlipidemia, unspecified: Secondary | ICD-10-CM

## 2018-09-02 DIAGNOSIS — K219 Gastro-esophageal reflux disease without esophagitis: Secondary | ICD-10-CM

## 2018-09-02 DIAGNOSIS — E119 Type 2 diabetes mellitus without complications: Secondary | ICD-10-CM

## 2018-09-02 DIAGNOSIS — I10 Essential (primary) hypertension: Secondary | ICD-10-CM

## 2018-09-02 DIAGNOSIS — R1010 Upper abdominal pain, unspecified: Secondary | ICD-10-CM

## 2018-09-02 NOTE — Progress Notes (Signed)
Subjective:    Patient ID: Abigail Molina, female    DOB: 03/30/1966, 52 y.o.   MRN: [id number]  HPI Patient Active Problem List   Diagnosis Date Noted  . Abdominal pain 03/04/2018  . Diabetes ([place]) 12/12/2015  . Hyperlipidemia 12/12/2015  . Hypertension 05/23/2015  . Acid reflux 05/23/2015      Review of Systems     Objective:   Physical Exam  Constitutional: She is oriented to person, place, and time. She appears well-developed and well-nourished.  obese  HENT:  Head: Normocephalic and atraumatic.  Eyes: Conjunctivae are normal. No scleral icterus.  Neck: No thyromegaly present.  Cardiovascular: Regular rhythm and normal heart sounds.  Pulmonary/Chest: Effort normal and breath sounds normal.  Abdominal: Soft.  Musculoskeletal: She exhibits no edema.  Neurological: She is alert and oriented to person, place, and time.  Skin: Skin is warm and dry.  Psychiatric: She has a normal mood and affect. Her behavior is normal. Judgment and thought content normal.    BP 105/70 (BP Location: Left Arm, Patient Position: Sitting)   Pulse 71   Temp 97.8 F (36.6 C) (Oral)   Ht '5\' 4"'  (1.626 m)   Wt 281 lb 3.2 oz (127.6 kg)   LMP 10/15/2016 (Within Months)   BMI 48.27 kg/m   Allergies as of 09/02/2018   No Known Allergies     Medication List        Accurate as of 09/02/18  6:21 PM. Always use your most recent med list.          acetaminophen 325 MG tablet Commonly known as:  TYLENOL Take 650 mg by mouth as needed for headache.   aspirin 81 MG tablet Take 81 mg by mouth daily.   cephALEXin 500 MG capsule Commonly known as:  KEFLEX Take 1 capsule (500 mg total) by mouth 3 (three) times daily.   cholecalciferol 1000 units tablet Commonly known as:  VITAMIN D Take 1,000 Units by mouth daily.   citalopram 40 MG tablet Commonly known as:  CELEXA Take 60 mg by mouth daily.   dicyclomine 20 MG tablet Commonly known as:  BENTYL Take 1 tablet (20 mg total) by mouth  every 6 (six) hours as needed.   insulin glargine 100 UNIT/ML injection Commonly known as:  LANTUS Inject 0.5 mLs (50 Units total) into the skin at bedtime. Inject 42 units once daily.   lisinopril 40 MG tablet Commonly known as:  PRINIVIL,ZESTRIL Take 1 tablet (40 mg total) by mouth daily.   metFORMIN 1000 MG tablet Commonly known as:  GLUCOPHAGE TAKE ONE TABLET BY MOUTH 2 TIMES A DAY WITH A MEAL.   mometasone 0.1 % ointment Commonly known as:  ELOCON Apply topically daily. Apply as needed for psoriasis   naproxen 500 MG tablet Commonly known as:  NAPROSYN TAKE ONE TABLET BY MOUTH 2 TIMES A DAY   NOVOFINE 32G X 6 MM Misc Generic drug:  Insulin Pen Needle 1 Syringe by Does not apply route. Use with Victoza.   ondansetron 4 MG disintegrating tablet Commonly known as:  ZOFRAN-ODT Take 1 tablet (4 mg total) by mouth every 8 (eight) hours as needed for nausea or vomiting.   pantoprazole 40 MG tablet Commonly known as:  PROTONIX Take 1 tablet (40 mg total) by mouth daily.   REXULTI 2 MG Tabs Generic drug:  Brexpiprazole Take 2 mg by mouth.   simvastatin 20 MG tablet Commonly known as:  ZOCOR Take 1 tablet (20 mg total)  by mouth every evening.   VICTOZA 18 MG/3ML Sopn Generic drug:  liraglutide Inject 1.2 mg once daily            Assessment & Plan:  1. Pain of upper abdomen Stop Victoza, obtain labs, return 1 month  2. Type 2 diabetes mellitus without complication, with long-term current use of insulin (HCC) A1C/lipase.CBC/Met C  3. Essential hypertension   4. Gastroesophageal reflux disease, esophagitis presence not specified   5. Hyperlipidemia, unspecified hyperlipidemia type

## 2018-09-03 LAB — CBC WITH DIFFERENTIAL/PLATELET
BASOS ABS: 0 10*3/uL (ref 0.0–0.2)
Basos: 0 %
EOS (ABSOLUTE): 0.2 10*3/uL (ref 0.0–0.4)
Eos: 4 %
Hematocrit: 37.6 % (ref 34.0–46.6)
Hemoglobin: 12.3 g/dL (ref 11.1–15.9)
Immature Grans (Abs): 0 10*3/uL (ref 0.0–0.1)
Immature Granulocytes: 0 %
LYMPHS ABS: 2.2 10*3/uL (ref 0.7–3.1)
Lymphs: 39 %
MCH: 27.6 pg (ref 26.6–33.0)
MCHC: 32.7 g/dL (ref 31.5–35.7)
MCV: 84 fL (ref 79–97)
MONOCYTES: 6 %
MONOS ABS: 0.4 10*3/uL (ref 0.1–0.9)
NEUTROS ABS: 3 10*3/uL (ref 1.4–7.0)
Neutrophils: 51 %
PLATELETS: 210 10*3/uL (ref 150–450)
RBC: 4.46 x10E6/uL (ref 3.77–5.28)
RDW: 14.4 % (ref 12.3–15.4)
WBC: 5.7 10*3/uL (ref 3.4–10.8)

## 2018-09-03 LAB — COMPREHENSIVE METABOLIC PANEL
ALBUMIN: 4.1 g/dL (ref 3.5–5.5)
ALT: 28 IU/L (ref 0–32)
AST: 42 IU/L — ABNORMAL HIGH (ref 0–40)
Albumin/Globulin Ratio: 1.3 (ref 1.2–2.2)
Alkaline Phosphatase: 91 IU/L (ref 39–117)
BILIRUBIN TOTAL: 0.8 mg/dL (ref 0.0–1.2)
BUN / CREAT RATIO: 15 (ref 9–23)
BUN: 13 mg/dL (ref 6–24)
CHLORIDE: 103 mmol/L (ref 96–106)
CO2: 21 mmol/L (ref 20–29)
CREATININE: 0.89 mg/dL (ref 0.57–1.00)
Calcium: 10.1 mg/dL (ref 8.7–10.2)
GFR calc Af Amer: 86 mL/min/{1.73_m2} (ref >59)
GFR calc non Af Amer: 74 mL/min/{1.73_m2} (ref >59)
GLUCOSE: 158 mg/dL — AB (ref 65–99)
Globulin, Total: 3.1 g/dL (ref 1.5–4.5)
Potassium: 4.2 mmol/L (ref 3.5–5.2)
Sodium: 144 mmol/L (ref 134–144)
Total Protein: 7.2 g/dL (ref 6.0–8.5)

## 2018-09-03 LAB — HEMOGLOBIN A1C
Est. average glucose Bld gHb Est-mCnc: 169 mg/dL
Hgb A1c MFr Bld: 7.5 % — ABNORMAL HIGH (ref 4.8–5.6)

## 2018-09-03 LAB — LIPASE: LIPASE: 117 U/L — AB (ref 14–72)

## 2018-09-24 ENCOUNTER — Other Ambulatory Visit: Payer: Self-pay | Admitting: Adult Health Nurse Practitioner

## 2018-09-24 ENCOUNTER — Other Ambulatory Visit: Payer: Self-pay | Admitting: Internal Medicine

## 2018-09-24 DIAGNOSIS — K2100 Gastro-esophageal reflux disease with esophagitis, without bleeding: Secondary | ICD-10-CM

## 2018-09-24 DIAGNOSIS — E7800 Pure hypercholesterolemia, unspecified: Secondary | ICD-10-CM

## 2018-09-24 DIAGNOSIS — Z794 Long term (current) use of insulin: Principal | ICD-10-CM

## 2018-09-24 DIAGNOSIS — E119 Type 2 diabetes mellitus without complications: Secondary | ICD-10-CM

## 2018-09-28 ENCOUNTER — Telehealth: Payer: Self-pay | Admitting: Pharmacist

## 2018-09-28 NOTE — Telephone Encounter (Signed)
09/28/2018 10:48:54 AM - refill-Victoza & tips  09/28/18 Printed refill request for Victoza Inject 1.2mg  daily #4 & tips, sending to [facility].Abigail Molina

## 2018-09-28 NOTE — Telephone Encounter (Signed)
09/28/2018 8:34:12 AM - Victoza & tips refill to provider  09/28/18 Printed [employer] refill request for Victoza Inject 1.2 mg daily & Novofine 32G tips, will take to [facility] for provider to sign.[name]

## 2018-09-30 ENCOUNTER — Telehealth: Payer: Self-pay | Admitting: Pharmacist

## 2018-09-30 NOTE — Telephone Encounter (Signed)
09/30/2018 9:48:52 AM - Lantus Vials refill  09/30/18 Faxed [employer] refill request to Lantus Vials Inject 42 units once daily #4.[name]

## 2018-10-05 ENCOUNTER — Ambulatory Visit: Payer: Medicaid Other

## 2018-10-15 ENCOUNTER — Telehealth: Payer: Self-pay | Admitting: Pharmacist

## 2018-10-15 NOTE — Telephone Encounter (Signed)
10/15/2018 11:27:03 AM - Victoza & tips refill  10/15/18 Faxed [employer] refill request for Victoza Inject 1.2mg  daily & Novofine 32G tips.[name]

## 2018-10-21 ENCOUNTER — Other Ambulatory Visit: Payer: Self-pay | Admitting: Internal Medicine

## 2018-10-21 DIAGNOSIS — I10 Essential (primary) hypertension: Secondary | ICD-10-CM

## 2018-11-02 ENCOUNTER — Ambulatory Visit: Payer: Medicaid Other

## 2018-11-04 ENCOUNTER — Ambulatory Visit: Payer: Medicaid Other | Admitting: Family Medicine

## 2018-11-04 VITALS — BP 144/84 | HR 68 | Temp 98.3°F | Ht 64.0 in | Wt 291.6 lb

## 2018-11-04 DIAGNOSIS — Z794 Long term (current) use of insulin: Secondary | ICD-10-CM

## 2018-11-04 DIAGNOSIS — E119 Type 2 diabetes mellitus without complications: Secondary | ICD-10-CM

## 2018-11-04 DIAGNOSIS — K760 Fatty (change of) liver, not elsewhere classified: Secondary | ICD-10-CM

## 2018-11-04 DIAGNOSIS — I10 Essential (primary) hypertension: Secondary | ICD-10-CM

## 2018-11-04 DIAGNOSIS — R1011 Right upper quadrant pain: Secondary | ICD-10-CM

## 2018-11-04 NOTE — Progress Notes (Signed)
Established Patient Office Visit  Subjective:  Patient ID: Abigail Molina, female    DOB: [birth year]  Age: 52 y.o. MRN: [id number]  CC:  Chief Complaint  Patient presents with  . Follow-up    f/u for recent labs    HPI Abigail Molina presents for lab results. A1C has improved to 7.5 from 9.0. Lipase is elevated at 117 from 89. AST improved to 42 from 56.  At last visit, Victoza was stopped because of R upper abdomen pain. Pt reports pain has not improved but has had pain for 1 year. She reports each pain episode pain gets slightly worse. ER visit a couple months ago did a RUQ U/S showing fatty liver. DM: Pt is taking Lantus 50 units. Her meter broke and she has not been able to get a new one. She reports frequent urination above normal.  She reports she drinks very little alcohol. She reports she has been taking Tylenol 2 tabs for arthritis in pain. Also takes Naproxen daily for arthritic pain. She endorses regular BM.  She endorses bloating, belching; no nausea or vomiting.     Past Medical History:  Diagnosis Date  . Cancer [facility])    Endometrial cancer  . Depression   . Diabetes mellitus without complication ([place])   . GERD (gastroesophageal reflux disease)   . Hyperlipidemia   . Hypertension   . Psoriasis 2015    Past Surgical History:  Procedure Laterality Date  . ABDOMINAL HYSTERECTOMY  2007  . CHOLECYSTECTOMY  2000    Family History  Problem Relation Age of Onset  . Diabetes Mother   . Hypertension Mother   . Depression Mother     Social History   Socioeconomic History  . Marital status: Widowed    Spouse name: Not on file  . Number of children: Not on file  . Years of education: Not on file  . Highest education level: Associate degree: academic program  Occupational History  . Not on file  Social Needs  . Financial resource strain: Very hard  . Food insecurity:    Worry: Often true    Inability: Often true  . Transportation needs:    Medical: No     Non-medical: No  Tobacco Use  . Smoking status: Never Smoker  . Smokeless tobacco: Never Used  Substance and Sexual Activity  . Alcohol use: Yes    Comment:  Rarely. One or two drinks a year   . Drug use: No  . Sexual activity: Not on file  Lifestyle  . Physical activity:    Days per week: Not on file    Minutes per session: Not on file  . Stress: Not on file  Relationships  . Social connections:    Talks on phone: Once a week    Gets together: Twice a week    Attends religious service: Never    Active member of club or organization: No    Attends meetings of clubs or organizations: Never    Relationship status: Widowed  . Intimate partner violence:    Fear of current or ex partner: No    Emotionally abused: No    Physically abused: No    Forced sexual activity: No  Other Topics Concern  . Not on file  Social History Narrative   PT is not working right now. Pt worries about financial needs since her husband died in 2023-06-16 and she has no income. She applied for Widow's benefits in July but has  not heard any information and the application is still pending. If possible please inquire about application status.      Outpatient Medications Prior to Visit  Medication Sig Dispense Refill  . acetaminophen (TYLENOL) 325 MG tablet Take 650 mg by mouth as needed for headache.    Marland Kitchen aspirin 81 MG tablet Take 81 mg by mouth daily.    . Brexpiprazole (REXULTI) 2 MG TABS Take 2 mg by mouth.    . cholecalciferol (VITAMIN D) 1000 units tablet Take 1,000 Units by mouth daily.    . citalopram (CELEXA) 40 MG tablet Take 60 mg by mouth daily.    Marland Kitchen dicyclomine (BENTYL) 20 MG tablet Take 1 tablet (20 mg total) by mouth every 6 (six) hours as needed. 20 tablet 0  . insulin glargine (LANTUS) 100 UNIT/ML injection Inject 0.5 mLs (50 Units total) into the skin at bedtime. Inject 42 units once daily. 10 mL 11  . Insulin Pen Needle (NOVOFINE) 32G X 6 MM MISC 1 Syringe by Does not apply route. Use with  Victoza.    Marland Kitchen lisinopril (PRINIVIL,ZESTRIL) 40 MG tablet TAKE ONE TABLET BY MOUTH EVERY DAY 60 tablet 0  . metFORMIN (GLUCOPHAGE) 1000 MG tablet TAKE ONE TABLET BY MOUTH 2 TIMES A DAY WITH A MEAL. 180 tablet 0  . naproxen (NAPROSYN) 500 MG tablet TAKE ONE TABLET BY MOUTH 2 TIMES A DAY 60 tablet 0  . pantoprazole (PROTONIX) 40 MG tablet TAKE ONE TABLET BY MOUTH EVERY DAY 90 tablet 0  . simvastatin (ZOCOR) 20 MG tablet TAKE ONE TABLET BY MOUTH EVERY EVENING 90 tablet 0  . cephALEXin (KEFLEX) 500 MG capsule Take 1 capsule (500 mg total) by mouth 3 (three) times daily. (Patient not taking: Reported on 09/02/2018) 21 capsule 0  . mometasone (ELOCON) 0.1 % ointment Apply topically daily. Apply as needed for psoriasis    . ondansetron (ZOFRAN ODT) 4 MG disintegrating tablet Take 1 tablet (4 mg total) by mouth every 8 (eight) hours as needed for nausea or vomiting. (Patient not taking: Reported on 09/02/2018) 20 tablet 0  . VICTOZA 18 MG/3ML SOPN Inject 1.2 mg once daily (Patient not taking: Reported on 11/04/2018) 36 mL 2   No facility-administered medications prior to visit.     No Known Allergies  ROS Review of Systems  All other systems reviewed and are negative.     Objective:    Physical Exam  Constitutional: She is oriented to person, place, and time.  Cardiovascular: Normal rate, regular rhythm and normal heart sounds.  Pulmonary/Chest: Effort normal and breath sounds normal.  Abdominal: Soft. Bowel sounds are normal.  Neurological: She is alert and oriented to person, place, and time.  Psychiatric: She has a normal mood and affect. Her behavior is normal. Judgment and thought content normal.  Vitals reviewed.   BP (!) 144/84   Pulse 68   Temp 98.3 F (36.8 C)   Ht '5\' 4"'  (1.626 m)   Wt 291 lb 9.6 oz (132.3 kg)   LMP 10/15/2016 (Within Months)   BMI 50.05 kg/m  Wt Readings from Last 3 Encounters:  11/04/18 291 lb 9.6 oz (132.3 kg)  09/02/18 281 lb 3.2 oz (127.6 kg)  08/19/18  280 lb (127 kg)     Health Maintenance Due  Topic Date Due  . PNEUMOCOCCAL POLYSACCHARIDE VACCINE AGE 54-64 HIGH RISK  11/01/1966  . FOOT EXAM  11/01/1974  . OPHTHALMOLOGY EXAM  11/01/1974  . HIV Screening  11/02/1979  . TETANUS/TDAP  11/02/1983  . PAP SMEAR  11/01/1985  . MAMMOGRAM  11/01/2014  . COLONOSCOPY  11/01/2014  . INFLUENZA VACCINE  07/01/2018    There are no preventive care reminders to display for this patient.  Lab Results  Component Value Date   TSH 1.490 06/01/2018   Lab Results  Component Value Date   WBC 5.7 09/02/2018   HGB 12.3 09/02/2018   HCT 37.6 09/02/2018   MCV 84 09/02/2018   PLT 210 09/02/2018   Lab Results  Component Value Date   NA 144 09/02/2018   K 4.2 09/02/2018   CO2 21 09/02/2018   GLUCOSE 158 (H) 09/02/2018   BUN 13 09/02/2018   CREATININE 0.89 09/02/2018   BILITOT 0.8 09/02/2018   ALKPHOS 91 09/02/2018   AST 42 (H) 09/02/2018   ALT 28 09/02/2018   PROT 7.2 09/02/2018   ALBUMIN 4.1 09/02/2018   CALCIUM 10.1 09/02/2018   ANIONGAP 9 08/19/2018   Lab Results  Component Value Date   CHOL 131 06/01/2018   Lab Results  Component Value Date   HDL 50 06/01/2018   Lab Results  Component Value Date   LDLCALC 56 06/01/2018   Lab Results  Component Value Date   TRIG 123 06/01/2018   Lab Results  Component Value Date   CHOLHDL 2.6 06/01/2018   Lab Results  Component Value Date   HGBA1C 7.5 (H) 09/02/2018      Assessment & Plan:   Problem List Items Addressed This Visit      Cardiovascular and Mediastinum   Hypertension   Relevant Orders   CBC w/Diff     Endocrine   Diabetes ([place])   Relevant Orders   HgB A1c     Other   Abdominal pain - Primary   Relevant Orders   Ambulatory referral to Gastroenterology   Amylase   Lipase   Comp Met (CMET)      No orders of the defined types were placed in this encounter.  DM: A1C came down.  Restart Victoza. Stay on lantus and other medications. Pt needs a  meter.   HTN:  Stay on current medications for now.  Right upper abdomen pain: Stop taking Tylenol. Take Naproxen 1-2 times a day if needed. Stop taking Simvastatin. Return in 1 week for labs.  Refer to GI at [facility] (location of prev colonoscopy)  Arthritis.  Use Naproxen as needed; warned her of potential side effects.  Follow-up:  Return in 1 week for labs.  [place]

## 2018-11-04 NOTE — Patient Instructions (Signed)
Stop taking Tylenol and Simvastatin.  If need pain relief, take Aleve.

## 2018-11-12 ENCOUNTER — Telehealth: Payer: Self-pay | Admitting: Pharmacist

## 2018-11-12 NOTE — Telephone Encounter (Signed)
11/12/2018 10:45:38 AM - Lantus Vials refill  11/12/18 Faxed [employer] refill request for Lantus Vials Inject 42 units once daily #4.[name]

## 2018-11-17 ENCOUNTER — Other Ambulatory Visit: Payer: Medicaid Other

## 2018-11-17 DIAGNOSIS — E119 Type 2 diabetes mellitus without complications: Secondary | ICD-10-CM

## 2018-11-17 DIAGNOSIS — Z794 Long term (current) use of insulin: Secondary | ICD-10-CM

## 2018-11-17 DIAGNOSIS — I10 Essential (primary) hypertension: Secondary | ICD-10-CM

## 2018-11-17 DIAGNOSIS — R1011 Right upper quadrant pain: Secondary | ICD-10-CM

## 2018-11-18 LAB — COMPREHENSIVE METABOLIC PANEL
A/G RATIO: 1.2 (ref 1.2–2.2)
ALK PHOS: 98 IU/L (ref 39–117)
ALT: 39 IU/L — ABNORMAL HIGH (ref 0–32)
AST: 56 IU/L — AB (ref 0–40)
Albumin: 3.9 g/dL (ref 3.5–5.5)
BUN/Creatinine Ratio: 10 (ref 9–23)
BUN: 7 mg/dL (ref 6–24)
Bilirubin Total: 0.9 mg/dL (ref 0.0–1.2)
CALCIUM: 9.6 mg/dL (ref 8.7–10.2)
CO2: 22 mmol/L (ref 20–29)
CREATININE: 0.72 mg/dL (ref 0.57–1.00)
Chloride: 101 mmol/L (ref 96–106)
GFR calc Af Amer: 110 mL/min/{1.73_m2} (ref >59)
GFR calc non Af Amer: 95 mL/min/{1.73_m2} (ref >59)
GLOBULIN, TOTAL: 3.2 g/dL (ref 1.5–4.5)
Glucose: 186 mg/dL — ABNORMAL HIGH (ref 65–99)
POTASSIUM: 4.1 mmol/L (ref 3.5–5.2)
SODIUM: 141 mmol/L (ref 134–144)
Total Protein: 7.1 g/dL (ref 6.0–8.5)

## 2018-11-18 LAB — CBC WITH DIFFERENTIAL/PLATELET
Basophils Absolute: 0 10*3/uL (ref 0.0–0.2)
Basos: 1 %
EOS (ABSOLUTE): 0.2 10*3/uL (ref 0.0–0.4)
EOS: 5 %
HEMATOCRIT: 37.4 % (ref 34.0–46.6)
Hemoglobin: 11.9 g/dL (ref 11.1–15.9)
IMMATURE GRANULOCYTES: 0 %
Immature Grans (Abs): 0 10*3/uL (ref 0.0–0.1)
Lymphocytes Absolute: 1.5 10*3/uL (ref 0.7–3.1)
Lymphs: 38 %
MCH: 25.3 pg — ABNORMAL LOW (ref 26.6–33.0)
MCHC: 31.8 g/dL (ref 31.5–35.7)
MCV: 80 fL (ref 79–97)
MONOS ABS: 0.4 10*3/uL (ref 0.1–0.9)
Monocytes: 9 %
NEUTROS PCT: 47 %
Neutrophils Absolute: 2 10*3/uL (ref 1.4–7.0)
Platelets: 176 10*3/uL (ref 150–450)
RBC: 4.7 x10E6/uL (ref 3.77–5.28)
RDW: 13.1 % (ref 12.3–15.4)
WBC: 4.1 10*3/uL (ref 3.4–10.8)

## 2018-11-18 LAB — AMYLASE: AMYLASE: 60 U/L (ref 31–124)

## 2018-11-18 LAB — HEMOGLOBIN A1C
Est. average glucose Bld gHb Est-mCnc: 212 mg/dL
HEMOGLOBIN A1C: 9 % — AB (ref 4.8–5.6)

## 2018-11-18 LAB — LIPASE: Lipase: 137 U/L — ABNORMAL HIGH (ref 14–72)

## 2018-12-09 ENCOUNTER — Other Ambulatory Visit: Payer: Self-pay | Admitting: Ophthalmology

## 2018-12-09 ENCOUNTER — Ambulatory Visit: Payer: Medicaid Other | Admitting: Ophthalmology

## 2018-12-09 LAB — HM DIABETES EYE EXAM

## 2018-12-15 ENCOUNTER — Other Ambulatory Visit: Payer: Self-pay | Admitting: Adult Health Nurse Practitioner

## 2018-12-15 DIAGNOSIS — K2100 Gastro-esophageal reflux disease with esophagitis, without bleeding: Secondary | ICD-10-CM

## 2018-12-30 ENCOUNTER — Ambulatory Visit: Payer: Medicaid Other | Admitting: Gastroenterology

## 2019-01-04 ENCOUNTER — Other Ambulatory Visit: Payer: Self-pay | Admitting: Internal Medicine

## 2019-01-04 ENCOUNTER — Other Ambulatory Visit: Payer: Self-pay | Admitting: Adult Health Nurse Practitioner

## 2019-01-04 DIAGNOSIS — E119 Type 2 diabetes mellitus without complications: Secondary | ICD-10-CM

## 2019-01-04 DIAGNOSIS — E7800 Pure hypercholesterolemia, unspecified: Secondary | ICD-10-CM

## 2019-01-04 DIAGNOSIS — Z794 Long term (current) use of insulin: Principal | ICD-10-CM

## 2019-01-13 ENCOUNTER — Ambulatory Visit: Payer: Medicaid Other

## 2019-01-20 ENCOUNTER — Ambulatory Visit: Payer: Medicaid Other

## 2019-01-27 ENCOUNTER — Emergency Department
Admission: EM | Admit: 2019-01-27 | Discharge: 2019-01-27 | Disposition: A | Discharge status: Home / Self Care | Payer: Medicaid Other | Attending: Emergency Medicine | Admitting: Emergency Medicine

## 2019-01-27 ENCOUNTER — Encounter: Payer: Self-pay | Admitting: Emergency Medicine

## 2019-01-27 ENCOUNTER — Emergency Department: Payer: Medicaid Other

## 2019-01-27 DIAGNOSIS — Y999 Unspecified external cause status: Secondary | ICD-10-CM | POA: Insufficient documentation

## 2019-01-27 DIAGNOSIS — I10 Essential (primary) hypertension: Secondary | ICD-10-CM | POA: Insufficient documentation

## 2019-01-27 DIAGNOSIS — W01198A Fall on same level from slipping, tripping and stumbling with subsequent striking against other object, initial encounter: Secondary | ICD-10-CM | POA: Insufficient documentation

## 2019-01-27 DIAGNOSIS — Z79899 Other long term (current) drug therapy: Secondary | ICD-10-CM | POA: Insufficient documentation

## 2019-01-27 DIAGNOSIS — Z794 Long term (current) use of insulin: Secondary | ICD-10-CM | POA: Insufficient documentation

## 2019-01-27 DIAGNOSIS — Y9289 Other specified places as the place of occurrence of the external cause: Secondary | ICD-10-CM | POA: Insufficient documentation

## 2019-01-27 DIAGNOSIS — Z7982 Long term (current) use of aspirin: Secondary | ICD-10-CM | POA: Insufficient documentation

## 2019-01-27 DIAGNOSIS — S42292A Other displaced fracture of upper end of left humerus, initial encounter for closed fracture: Secondary | ICD-10-CM | POA: Insufficient documentation

## 2019-01-27 DIAGNOSIS — E119 Type 2 diabetes mellitus without complications: Secondary | ICD-10-CM | POA: Insufficient documentation

## 2019-01-27 DIAGNOSIS — Y93K1 Activity, walking an animal: Secondary | ICD-10-CM | POA: Insufficient documentation

## 2019-01-27 MED ORDER — MELOXICAM 15 MG PO TABS: 15.0000 mg | ORAL_TABLET | Freq: Every day | ORAL | 0 refills | Status: DC

## 2019-01-27 MED ORDER — OXYCODONE-ACETAMINOPHEN 5-325 MG PO TABS: 1.0000 | ORAL_TABLET | Freq: Four times a day (QID) | ORAL | 0 refills | Status: DC | PRN

## 2019-01-27 MED ORDER — OXYCODONE HCL 5 MG PO TABS
10.0000 mg | ORAL_TABLET | Freq: Once | ORAL | Status: AC
  Administered 2019-01-27: 10 mg via ORAL
  Filled 2019-01-27: qty 2

## 2019-01-27 MED ORDER — NAPROXEN 500 MG PO TABS
500.0000 mg | ORAL_TABLET | Freq: Once | ORAL | Status: AC
  Administered 2019-01-27: 500 mg via ORAL
  Filled 2019-01-27: qty 1

## 2019-01-27 NOTE — Discharge Instructions (Signed)
Please call and schedule follow-up appointment with orthopedics.  Return to the emergency department for any symptom of concern if unable to see your primary care provider or the orthopedist.

## 2019-01-27 NOTE — ED Triage Notes (Signed)
Patient reports her dog pulled her down. Patient c/o left upper arm pain - denies shoulder pain. No obvious deformities on assessment.

## 2019-01-27 NOTE — ED Provider Notes (Signed)
[facility] Emergency Department Provider Note ____________________________________________  Time seen: Approximately 10:53 PM  I have reviewed the triage vital signs and the nursing notes.   HISTORY  Chief Complaint Arm Injury    HPI Abigail Molina is a 53 y.o. female who presents to the emergency department for evaluation and treatment of left upper arm pain.  She was out walking her dog and the dog pulled her down.  She states that she landed directly on her left arm.  No alleviating measures attempted prior to arrival.   Past Medical History:  Diagnosis Date  . Cancer [facility])    Endometrial cancer  . Depression   . Diabetes mellitus without complication ([place])   . GERD (gastroesophageal reflux disease)   . Hyperlipidemia   . Hypertension   . Psoriasis 2015    Patient Active Problem List   Diagnosis Date Noted  . Abdominal pain 03/04/2018  . Diabetes ([place]) 12/12/2015  . Hyperlipidemia 12/12/2015  . Hypertension 05/23/2015  . Acid reflux 05/23/2015    Past Surgical History:  Procedure Laterality Date  . ABDOMINAL HYSTERECTOMY  2007  . CHOLECYSTECTOMY  2000    Prior to Admission medications   Medication Sig Start Date End Date Taking? Authorizing Provider  acetaminophen (TYLENOL) 325 MG tablet Take 650 mg by mouth as needed for headache.    [provider]  aspirin 81 MG tablet Take 81 mg by mouth daily.    [provider]  Brexpiprazole (REXULTI) 2 MG TABS Take 2 mg by mouth.    [provider]  cephALEXin (KEFLEX) 500 MG capsule Take 1 capsule (500 mg total) by mouth 3 (three) times daily. Patient not taking: Reported on 09/02/2018 08/20/18   Paulette Blanch, MD  cholecalciferol (VITAMIN D) 1000 units tablet Take 1,000 Units by mouth daily.    [provider]  citalopram (CELEXA) 40 MG tablet Take 60 mg by mouth daily.    [provider]  dicyclomine (BENTYL) 20 MG tablet Take 1 tablet (20 mg total) by  mouth every 6 (six) hours as needed. 08/20/18   Paulette Blanch, MD  insulin glargine (LANTUS) 100 UNIT/ML injection Inject 0.5 mLs (50 Units total) into the skin at bedtime. Inject 42 units once daily. 06/17/18   Doles-Johnson, Teah, NP  Insulin Pen Needle (NOVOFINE) 32G X 6 MM MISC 1 Syringe by Does not apply route. Use with Victoza.    [provider]  lisinopril (PRINIVIL,ZESTRIL) 40 MG tablet TAKE ONE TABLET BY MOUTH EVERY DAY 10/21/18   Tawni Millers, MD  meloxicam (MOBIC) 15 MG tablet Take 1 tablet (15 mg total) by mouth daily. 01/27/19   Shawneen Deetz B, FNP  metFORMIN (GLUCOPHAGE) 1000 MG tablet TAKE ONE TABLET BY MOUTH 2 TIMES A DAY WITH A MEAL. 01/04/19   Doles-Johnson, Teah, NP  mometasone (ELOCON) 0.1 % ointment Apply topically daily. Apply as needed for psoriasis    [provider]  naproxen (NAPROSYN) 500 MG tablet TAKE ONE TABLET BY MOUTH 2 TIMES A DAY 03/31/18   Tawni Millers, MD  ondansetron (ZOFRAN ODT) 4 MG disintegrating tablet Take 1 tablet (4 mg total) by mouth every 8 (eight) hours as needed for nausea or vomiting. Patient not taking: Reported on 09/02/2018 08/20/18   Paulette Blanch, MD  oxyCODONE-acetaminophen (PERCOCET) 5-325 MG tablet Take 1 tablet by mouth every 6 (six) hours as needed for severe pain. 01/27/19 01/27/20  Layson Bertsch, Johnette Abraham B, FNP  pantoprazole (PROTONIX) 40  MG tablet TAKE ONE TABLET BY MOUTH EVERY DAY 12/15/18   Doles-Johnson, Teah, NP  simvastatin (ZOCOR) 20 MG tablet TAKE ONE TABLET BY MOUTH EVERY EVENING 01/04/19   Doles-Johnson, Teah, NP  VICTOZA 18 MG/3ML SOPN Inject 1.2 mg once daily Patient not taking: Reported on 11/04/2018 12/03/17   Doles-Johnson, Teah, NP    Allergies Patient has no known allergies.  Family History  Problem Relation Age of Onset  . Diabetes Mother   . Hypertension Mother   . Depression Mother     Social History Social History   Tobacco Use  . Smoking status: Never Smoker  . Smokeless tobacco: Never Used  Substance  Use Topics  . Alcohol use: Yes    Comment:  Rarely. One or two drinks a year   . Drug use: No    Review of Systems Constitutional: Negative for fever. Cardiovascular: Negative for chest pain. Respiratory: Negative for shortness of breath. Musculoskeletal: Positive for left upper arm pain. Skin: Negative for open wound or lesion, specifically of the left upper arm. Neurological: Negative for decrease in sensation  ____________________________________________   PHYSICAL EXAM:  VITAL SIGNS: ED Triage Vitals [01/27/19 1954]  Enc Vitals Group     BP 125/77     Pulse Rate 81     Resp 17     Temp 97.7 F (36.5 C)     Temp src      SpO2 100 %     Weight 281 lb (127.5 kg)     Height 5\' 5"  (1.651 m)     Head Circumference      Peak Flow      Pain Score 10     Pain Loc      Pain Edu?      Excl. in [place]?     Constitutional: Alert and oriented. Well appearing and in no acute distress. Eyes: Conjunctivae are clear without discharge or drainage Head: Atraumatic Neck: Supple.  No focal midline tenderness. Respiratory: No cough. Respirations are even and unlabored. Musculoskeletal: Tenderness over the left proximal humerus.  Neurologic: Motor and sensory function of the left upper extremity is intact. Skin: No open wounds or lesions noted about the left upper arm. Psychiatric: Affect and behavior are appropriate.  ____________________________________________   LABS (all labs ordered are listed, but only abnormal results are displayed)  Labs Reviewed - No data to display ____________________________________________  RADIOLOGY  Image of the humerus shows a humeral neck fracture and fracture through the greater tuberosity without subluxation or dislocation of the left shoulder ____________________________________________   PROCEDURES  .Ortho Injury Treatment Date/Time: 01/27/2019 10:56 PM Performed by: Victorino Dike, FNP Authorized by: Victorino Dike, FNP    Consent:    Consent obtained:  Verbal   Consent given by:  Patient   Risks discussed:  StiffnessInjury location: upper arm Location details: left upper arm Injury type: fracture Fracture type: humeral shaft Pre-procedure neurovascular assessment: neurovascularly intact Manipulation performed: no Immobilization: sling Post-procedure neurovascular assessment: post-procedure neurovascularly intact   ___________________________________________   INITIAL IMPRESSION / ASSESSMENT AND PLAN / ED COURSE  Abigail Molina is a 53 y.o. who presents to the emergency department for treatment and evaluation after mechanical, non-syncopal fall prior to arrival.  X-ray shows a left humeral neck fracture that extends into the greater tuberosity.  She is placed in a sling and will be given pain medication and anti-inflammatory.  Patient states that she is a patient at [facility] and is unsure if she is  going to be able to schedule an appointment with orthopedics.  Patient was encouraged to call the orthopedist office in the morning to inquire about rates for uninsured patients.  She was advised to call and talk to her provider at [facility] if she is unable to afford to go see the orthopedist.  She was encouraged to return to the emergency department for symptoms of concern if she is unable to see either of the 2.  Medications  oxyCODONE (Oxy IR/ROXICODONE) immediate release tablet 10 mg (10 mg Oral Given 01/27/19 2104)  naproxen (NAPROSYN) tablet 500 mg (500 mg Oral Given 01/27/19 2105)    Pertinent labs & imaging results that were available during my care of the patient were reviewed by me and considered in my medical decision making (see chart for details).  _________________________________________   FINAL CLINICAL IMPRESSION(S) / ED DIAGNOSES  Final diagnoses:  Other closed displaced fracture of proximal end of left humerus, initial encounter    ED Discharge Orders         Ordered     oxyCODONE-acetaminophen (PERCOCET) 5-325 MG tablet  Every 6 hours PRN     01/27/19 2058    meloxicam (MOBIC) 15 MG tablet  Daily     01/27/19 2058           If controlled substance prescribed during this visit, 12 month history viewed on the [place] prior to issuing an initial prescription for Schedule II or III opiod.   Victorino Dike, FNP 01/27/19 4034    Harvest Dark, MD 01/27/19 2329

## 2019-02-08 ENCOUNTER — Ambulatory Visit
Admission: RE | Admit: 2019-02-08 | Discharge: 2019-02-08 | Disposition: A | Discharge status: Home / Self Care | Payer: Medicaid Other | Source: Ambulatory Visit | Attending: Physician Assistant | Admitting: Physician Assistant

## 2019-02-08 ENCOUNTER — Other Ambulatory Visit: Payer: Self-pay | Admitting: Physician Assistant

## 2019-02-08 ENCOUNTER — Ambulatory Visit: Admission: RE | Admit: 2019-02-08 | Payer: Medicaid Other | Source: Ambulatory Visit

## 2019-02-08 ENCOUNTER — Other Ambulatory Visit (HOSPITAL_COMMUNITY): Payer: Self-pay | Admitting: Physician Assistant

## 2019-02-08 DIAGNOSIS — S42231A 3-part fracture of surgical neck of right humerus, initial encounter for closed fracture: Secondary | ICD-10-CM | POA: Insufficient documentation

## 2019-02-08 DIAGNOSIS — S42232A 3-part fracture of surgical neck of left humerus, initial encounter for closed fracture: Secondary | ICD-10-CM | POA: Insufficient documentation

## 2019-02-26 ENCOUNTER — Other Ambulatory Visit: Payer: Self-pay

## 2019-02-26 ENCOUNTER — Encounter: Payer: Self-pay | Admitting: Emergency Medicine

## 2019-02-26 ENCOUNTER — Emergency Department
Admission: EM | Admit: 2019-02-26 | Discharge: 2019-02-26 | Disposition: A | Discharge status: Home / Self Care | Payer: Medicaid Other | Attending: Emergency Medicine | Admitting: Emergency Medicine

## 2019-02-26 DIAGNOSIS — I10 Essential (primary) hypertension: Secondary | ICD-10-CM | POA: Insufficient documentation

## 2019-02-26 DIAGNOSIS — Z7982 Long term (current) use of aspirin: Secondary | ICD-10-CM | POA: Insufficient documentation

## 2019-02-26 DIAGNOSIS — E119 Type 2 diabetes mellitus without complications: Secondary | ICD-10-CM | POA: Insufficient documentation

## 2019-02-26 DIAGNOSIS — Z794 Long term (current) use of insulin: Secondary | ICD-10-CM | POA: Insufficient documentation

## 2019-02-26 DIAGNOSIS — X58XXXD Exposure to other specified factors, subsequent encounter: Secondary | ICD-10-CM | POA: Insufficient documentation

## 2019-02-26 DIAGNOSIS — S42212D Unspecified displaced fracture of surgical neck of left humerus, subsequent encounter for fracture with routine healing: Secondary | ICD-10-CM | POA: Insufficient documentation

## 2019-02-26 DIAGNOSIS — C541 Malignant neoplasm of endometrium: Secondary | ICD-10-CM | POA: Insufficient documentation

## 2019-02-26 DIAGNOSIS — S42215A Unspecified nondisplaced fracture of surgical neck of left humerus, initial encounter for closed fracture: Secondary | ICD-10-CM

## 2019-02-26 DIAGNOSIS — Z79899 Other long term (current) drug therapy: Secondary | ICD-10-CM | POA: Insufficient documentation

## 2019-02-26 MED ORDER — OXYCODONE HCL 5 MG PO TABS: ORAL_TABLET | ORAL | 0 refills | Status: DC

## 2019-02-26 NOTE — Discharge Instructions (Addendum)
Keep your follow-up appointment with your orthopedist.  If any continued problems or concerns call to talk to them at [facility] orthopedic.  Take pain medication only as directed every 6-8 hours as needed for pain.  Be aware that this medication could cause drowsiness and increase your risk for falling.  Also continue taking your meloxicam 1 daily as directed.

## 2019-02-26 NOTE — ED Triage Notes (Signed)
Pt presents to ED via POV with c/o L shoulder pain, pt states fell on 2/27 and broker her L shoulder, has had ortho follow up and was told to return on 6 weeks, pt states pain is worsening. Pt states has been unable to wear sling x "a couple of weeks" due to her dog destroying it.

## 2019-02-26 NOTE — ED Notes (Signed)
Pt verbalized understanding of discharge instructions. NAD at this time. 

## 2019-02-26 NOTE — ED Provider Notes (Signed)
[facility] Emergency Department Provider Note   ____________________________________________   First MD Initiated Contact with Patient 02/26/19 1403     (approximate)  I have reviewed the triage vital signs and the nursing notes.   HISTORY  Chief Complaint Shoulder Pain   HPI Abigail Molina is a 53 y.o. female resents to the ED with complaint of left shoulder pain.  Patient states that she was seen in the ED and diagnosed with a fractured shoulder.  She is already seen the orthopedist for one follow-up and is to return in 6 weeks.  She states that she was wearing a sling for support.  She states that she took a shower when she came out her dog had chewed up the straps on her sling.  She denies any new injury or complaints.  Currently she has been taking Aleve but without the support of her sling the Aleve is not helping.  Rates her pain as an 8 out of 10.       Past Medical History:  Diagnosis Date  . Cancer [facility])    Endometrial cancer  . Depression   . Diabetes mellitus without complication ([place])   . GERD (gastroesophageal reflux disease)   . Hyperlipidemia   . Hypertension   . Psoriasis 2015    Patient Active Problem List   Diagnosis Date Noted  . Abdominal pain 03/04/2018  . Diabetes ([place]) 12/12/2015  . Hyperlipidemia 12/12/2015  . Hypertension 05/23/2015  . Acid reflux 05/23/2015    Past Surgical History:  Procedure Laterality Date  . ABDOMINAL HYSTERECTOMY  2007  . CHOLECYSTECTOMY  2000    Prior to Admission medications   Medication Sig Start Date End Date Taking? Authorizing Provider  acetaminophen (TYLENOL) 325 MG tablet Take 650 mg by mouth as needed for headache.    [provider]  aspirin 81 MG tablet Take 81 mg by mouth daily.    [provider]  Brexpiprazole (REXULTI) 2 MG TABS Take 2 mg by mouth.    [provider]  cholecalciferol (VITAMIN D) 1000 units tablet Take 1,000 Units by mouth daily.     [provider]  citalopram (CELEXA) 40 MG tablet Take 60 mg by mouth daily.    [provider]  dicyclomine (BENTYL) 20 MG tablet Take 1 tablet (20 mg total) by mouth every 6 (six) hours as needed. 08/20/18   Paulette Blanch, MD  insulin glargine (LANTUS) 100 UNIT/ML injection Inject 0.5 mLs (50 Units total) into the skin at bedtime. Inject 42 units once daily. 06/17/18   Doles-Johnson, Teah, NP  Insulin Pen Needle (NOVOFINE) 32G X 6 MM MISC 1 Syringe by Does not apply route. Use with Victoza.    [provider]  lisinopril (PRINIVIL,ZESTRIL) 40 MG tablet TAKE ONE TABLET BY MOUTH EVERY DAY 10/21/18   Tawni Millers, MD  meloxicam (MOBIC) 15 MG tablet Take 1 tablet (15 mg total) by mouth daily. 01/27/19   Triplett, Cari B, FNP  metFORMIN (GLUCOPHAGE) 1000 MG tablet TAKE ONE TABLET BY MOUTH 2 TIMES A DAY WITH A MEAL. 01/04/19   Doles-Johnson, Teah, NP  mometasone (ELOCON) 0.1 % ointment Apply topically daily. Apply as needed for psoriasis    [provider]  ondansetron (ZOFRAN ODT) 4 MG disintegrating tablet Take 1 tablet (4 mg total) by mouth every 8 (eight) hours as needed for nausea or vomiting. Patient not taking: Reported on 09/02/2018 08/20/18   Paulette Blanch, MD  oxyCODONE (ROXICODONE)  5 MG immediate release tablet 1 every 6-8 hours as needed for pain. 02/26/19   Johnn Hai, PA-C  pantoprazole (PROTONIX) 40 MG tablet TAKE ONE TABLET BY MOUTH EVERY DAY 12/15/18   Doles-Johnson, Teah, NP  simvastatin (ZOCOR) 20 MG tablet TAKE ONE TABLET BY MOUTH EVERY EVENING 01/04/19   Doles-Johnson, Teah, NP  VICTOZA 18 MG/3ML SOPN Inject 1.2 mg once daily Patient not taking: Reported on 11/04/2018 12/03/17   Doles-Johnson, Teah, NP    Allergies Patient has no known allergies.  Family History  Problem Relation Age of Onset  . Diabetes Mother   . Hypertension Mother   . Depression Mother     Social History Social History   Tobacco Use  . Smoking status: Never Smoker  .  Smokeless tobacco: Never Used  Substance Use Topics  . Alcohol use: Yes    Comment:  Rarely. One or two drinks a year   . Drug use: No    Review of Systems Constitutional: No fever/chills Cardiovascular: Denies chest pain. Respiratory: Denies shortness of breath. Gastrointestinal: No abdominal pain.  No nausea, no vomiting. Musculoskeletal: Positive left shoulder pain. Skin: Negative for rash. Neurological: Negative for headaches, focal weakness or numbness. ___________________________________________   PHYSICAL EXAM:  VITAL SIGNS: ED Triage Vitals  Enc Vitals Group     BP 02/26/19 1400 (!) 164/81     Pulse Rate 02/26/19 1400 92     Resp 02/26/19 1400 14     Temp 02/26/19 1400 98.7 F (37.1 C)     Temp Source 02/26/19 1400 Oral     SpO2 02/26/19 1400 94 %     Weight 02/26/19 1358 281 lb (127.5 kg)     Height 02/26/19 1358 5\' 4"  (1.626 m)     Head Circumference --      Peak Flow --      Pain Score 02/26/19 1357 8     Pain Loc --      Pain Edu? --      Excl. in [place]? --    Constitutional: Alert and oriented. Well appearing and in no acute distress. Eyes: Conjunctivae are normal.  Head: Atraumatic. Neck: No stridor.   Cardiovascular: Normal rate, regular rhythm. Grossly normal heart sounds.  Good peripheral circulation. Respiratory: Normal respiratory effort.  No retractions. Lungs CTAB. Musculoskeletal: Examination of left shoulder there is no gross deformity however there is soft tissue tenderness on palpation over the lateral deltoid and AC joint area.  No ecchymosis or abrasions were seen.  Range of motion is restricted secondary to pain.  Motor or sensory function intact distal to the area and pulses present. Neurologic:  Normal speech and language. No gross focal neurologic deficits are appreciated. No gait instability. Skin:  Skin is warm, dry and intact. No rash noted. Psychiatric: Mood and affect are normal. Speech and behavior are normal.   ____________________________________________   LABS (all labs ordered are listed, but only abnormal results are displayed)  Labs Reviewed - No data to display  PROCEDURES  Procedure(s) performed (including Critical Care):  Procedures  Sling was applied by ED tech Olivia Mackie ____________________________________________   INITIAL IMPRESSION / ASSESSMENT AND PLAN / ED COURSE  As part of my medical decision making, I reviewed the following data within the electronic MEDICAL RECORD NUMBER Notes from prior ED visits and [place] Controlled Substance Database  Patient presents to the ED with complaint of left shoulder pain that has increased since she has been unable to wear her arm sling.  She states that her dog chewed the straps when she took it off to take a shower.  She is already followed up with the orthopedist and told to return in 6 weeks.  The added strain of trying to hold her arm has increased her pain although she denies any recent injury to this area.  Physical exam is benign with the exception of her noted pain around the left deltoid AC joint area due to her fracture seen in the ED on 01/27/2019.  Patient was given a sling and also a limited amount of Roxicodone 5 mg as needed for pain.  She will also continue taking her Aleve as she has been doing for inflammation and pain.  She will follow-up with her orthopedist at [facility] if any continued problems.   ____________________________________________   FINAL CLINICAL IMPRESSION(S) / ED DIAGNOSES  Final diagnoses:  Closed nondisplaced fracture of surgical neck of left humerus, unspecified fracture morphology, initial encounter     ED Discharge Orders         Ordered    oxyCODONE (ROXICODONE) 5 MG immediate release tablet     02/26/19 1417           Note:  This document was prepared using Dragon voice recognition software and may include unintentional dictation errors.    Johnn Hai, PA-C 02/26/19 1441     Lavonia Drafts, MD 02/26/19 [phone]

## 2019-03-04 ENCOUNTER — Telehealth: Payer: Self-pay

## 2019-03-04 ENCOUNTER — Other Ambulatory Visit: Payer: Self-pay

## 2019-03-04 NOTE — Telephone Encounter (Signed)
We can go ahead and send in fluconazole 150 mg for her.

## 2019-03-04 NOTE — Telephone Encounter (Signed)
Patient states she has a yeast infection. Sx: itching, thick white discharge. Abigail Molina can we call her in something?

## 2019-03-13 ENCOUNTER — Encounter: Admit: 2019-03-13 | Discharge: 2019-03-13 | Disposition: A | Discharge status: Home / Self Care | Payer: MEDICAID

## 2019-03-13 ENCOUNTER — Emergency Department: Admit: 2019-03-13 | Discharge: 2019-03-13 | Disposition: A | Discharge status: Home / Self Care | Payer: MEDICAID

## 2019-03-13 DIAGNOSIS — S42232D 3-part fracture of surgical neck of left humerus, subsequent encounter for fracture with routine healing: Secondary | ICD-10-CM

## 2019-03-13 DIAGNOSIS — G8911 Acute pain due to trauma: Principal | ICD-10-CM

## 2019-03-14 ENCOUNTER — Other Ambulatory Visit: Payer: Self-pay | Admitting: Internal Medicine

## 2019-03-14 DIAGNOSIS — I10 Essential (primary) hypertension: Secondary | ICD-10-CM

## 2019-04-04 ENCOUNTER — Telehealth: Payer: Self-pay | Admitting: Pharmacist

## 2019-04-04 NOTE — Telephone Encounter (Signed)
04/04/2019 2:00:16 PM - Victoza & tips renewal  04/04/2019 Faxed [employer] renewal application for [employer] 1.2mg  once daily & Novofine 32G tips use daily with Victoza.[name]

## 2019-04-26 ENCOUNTER — Other Ambulatory Visit: Payer: Self-pay | Admitting: Adult Health Nurse Practitioner

## 2019-04-26 DIAGNOSIS — K2100 Gastro-esophageal reflux disease with esophagitis, without bleeding: Secondary | ICD-10-CM

## 2019-04-26 DIAGNOSIS — E119 Type 2 diabetes mellitus without complications: Secondary | ICD-10-CM

## 2019-04-26 DIAGNOSIS — E7800 Pure hypercholesterolemia, unspecified: Secondary | ICD-10-CM

## 2019-05-31 ENCOUNTER — Telehealth: Payer: Self-pay | Admitting: Pharmacist

## 2019-05-31 NOTE — Telephone Encounter (Signed)
05/31/2019 10:33:22 AM - Lantus/Sanofi renewal to pt. & Dr.  05/31/2019 Patient enrollment with [employer] ended 03/15/2019--printed [employer] renewal application for Lantus Vials Inject 42 units once daily. Mailing patient her portion to sign & return also taking [place] form for provider to sign.AJ   05/31/2019 9:53:15 AM - Rexulti to pt. & dr.  05/31/2019 Received pharmacy printout from [place] on [place] 2mg  Take one tablet daily at bedtime--they have been filling from [facility] and it is getting low-need to order from manufacturer now. Printed [employer] application-mailing patient her portion to sign & return also mailing provider Dr. Sherlynn Stalls his portion to sign & return for PAP Renewal. 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

## 2019-06-10 ENCOUNTER — Telehealth: Payer: Self-pay | Admitting: Pharmacist

## 2019-06-10 NOTE — Telephone Encounter (Signed)
06/10/2019 10:03:33 AM - Rexulti 2mg  renewal to [employer]  06/10/2019 Faxed [employer] application for renewal for Rexulti 2mg  Take 1 tablet every day at bedtime.AJ  06/10/2019 10:02:38 AM - Lantus Vials renewal to [employer]  06/10/2019 Faxed [employer] renewal for Lantus Vial Inject 42 units daily #4.[name]

## 2019-06-22 ENCOUNTER — Encounter: Payer: Self-pay | Admitting: Pharmacist

## 2019-06-22 ENCOUNTER — Ambulatory Visit: Payer: Medicaid Other | Admitting: Pharmacist

## 2019-06-22 DIAGNOSIS — Z79899 Other long term (current) drug therapy: Secondary | ICD-10-CM

## 2019-06-22 NOTE — Progress Notes (Signed)
[facility] Phone Visit Note  Patient: Abigail Molina MRN: [id number] Date of Birth: September 21, 1966 PCP: Default, Provider, MD   [name] 53 y.o. female was contacted today for a medication therapy management visit/outreach call. Two patient identifiers were used to verify patient identity over the phone.   LMP 10/15/2016   Patient Information   Past Medical History:  Diagnosis Date  . Cancer [facility])    Endometrial cancer  . Depression   . Diabetes mellitus without complication ([place])   . GERD (gastroesophageal reflux disease)   . Hyperlipidemia   . Hypertension   . Psoriasis 2015      Past Surgical History:  Procedure Laterality Date  . ABDOMINAL HYSTERECTOMY  2007  . CHOLECYSTECTOMY  2000     Family History  Problem Relation Age of Onset  . Diabetes Mother   . Hypertension Mother   . Depression Mother     New Diagnoses (since last visit): dislocated shoulder  Family Support: Comments:husband died last 06-Jun-2018  Lifestyle Diet: Breakfast: fruit with bread & butter Lunch: sandwich w/ fruit Dinner: meat, veggie, starch Drinks: water, diet dr. Malachi Bonds Exercise: pt endorses rarely exercising except when she goes to [employer] or walks her dog (5-10 min 2-3 times daily)    Social History   Substance and Sexual Activity  Alcohol Use Yes   Comment:  Rarely. One or two drinks a year       Social History   Tobacco Use  Smoking Status Never Smoker  Smokeless Tobacco Never Used      Health Maintenance  Topic Date Due  . PNEUMOCOCCAL POLYSACCHARIDE VACCINE AGE 37-64 HIGH RISK  11/01/1966  . FOOT EXAM  11/01/1974  . HIV Screening  11/02/1979  . TETANUS/TDAP  11/02/1983  . PAP SMEAR-Modifier  11/01/1985  . MAMMOGRAM  11/01/2014  . COLONOSCOPY  11/01/2014  . HEMOGLOBIN A1C  05/19/2019  . INFLUENZA VACCINE  07/02/2019  . OPHTHALMOLOGY EXAM  12/10/2019  . Hepatitis C Screening  Completed   Outpatient Encounter Medications as of 06/22/2019   Medication Sig  . aspirin 81 MG tablet Take 81 mg by mouth daily.  . Brexpiprazole (REXULTI) 2 MG TABS Take 2 mg by mouth.  . citalopram (CELEXA) 40 MG tablet Take 40 mg by mouth daily.   . insulin glargine (LANTUS) 100 UNIT/ML injection Inject 0.5 mLs (50 Units total) into the skin at bedtime. Inject 42 units once daily. (Patient taking differently: Inject 50 Units into the skin at bedtime. )  . Insulin Pen Needle (NOVOFINE) 32G X 6 MM MISC 1 Syringe by Does not apply route. Use with Victoza.  Marland Kitchen lisinopril (PRINIVIL,ZESTRIL) 40 MG tablet TAKE ONE TABLET BY MOUTH EVERY DAY  . metFORMIN (GLUCOPHAGE) 1000 MG tablet TAKE ONE TABLET BY MOUTH 2 TIMES A DAY WITH A MEAL.  . mometasone (ELOCON) 0.1 % ointment Apply topically daily. Apply as needed for psoriasis  . Multiple Vitamin (MULTIVITAMIN) tablet Take 1 tablet by mouth daily.  . naproxen sodium (ALEVE) 220 MG tablet Take 220 mg by mouth 2 (two) times daily as needed (pain).  . pantoprazole (PROTONIX) 40 MG tablet TAKE ONE TABLET BY MOUTH EVERY DAY  . simvastatin (ZOCOR) 20 MG tablet TAKE ONE TABLET BY MOUTH EVERY EVENING  . VICTOZA 18 MG/3ML SOPN Inject 1.2 mg once daily  . oxyCODONE (ROXICODONE) 5 MG immediate release tablet 1 every 6-8 hours as needed for pain. (Patient not taking: Reported on 06/22/2019)  . [DISCONTINUED] acetaminophen (TYLENOL) 325  MG tablet Take 650 mg by mouth as needed for headache.  . [DISCONTINUED] cholecalciferol (VITAMIN D) 1000 units tablet Take 1,000 Units by mouth daily.  . [DISCONTINUED] dicyclomine (BENTYL) 20 MG tablet Take 1 tablet (20 mg total) by mouth every 6 (six) hours as needed.  . [DISCONTINUED] meloxicam (MOBIC) 15 MG tablet Take 1 tablet (15 mg total) by mouth daily.  . [DISCONTINUED] ondansetron (ZOFRAN ODT) 4 MG disintegrating tablet Take 1 tablet (4 mg total) by mouth every 8 (eight) hours as needed for nausea or vomiting. (Patient not taking: Reported on 09/02/2018)   No facility-administered  encounter medications on file as of 06/22/2019.     Assessment and Plan:  1. Adherence Pt reports no issues with missing doses of medication and knew her medications, doses, and indications well. Rx fill hx indicates compliance.  2. HTN Hx of hypertension on lisinopril 40mg  daily. Pt does not have a way to measure BP at home currently. Pt reported to me that her BP averaged 120/80, although last BP taken at [facility] in March 2020 was 164/81. Pt may benefit from an additional antihypertensive. Counseled pt on low salt alternatives to her diet and potentially taking her dog for a longer walk once a week to slowly increase her exercise. Pt verbalized understanding.   3. Diabetes Hx of diabetes on metformin 1000mg  BID, Lantus 50 units daily, Victoza 1.2mg  daily. A1c (10/2018) 9.0, (08/2018) 7.5. Pt stated her goal A1c was <6%. Pt reported she has been eating a lot of fruit lately but expressed concern that she could be insulin resistant. Pt reported that her home BG meter has broken and she would like to get a replacement.   4. Hyperlipidemia Hx of hyperlipidemia on simvastatin 20mg  daily. Lipid panel (12/2017) LDL 77, HDL 59, TG 127. Pt has well controlled hyperlipidemia but may benefit from a repeat lipid panel. Pt reports no concerns.   5. Depression Pt with hx of depression on citalopram 40mg  daily & brexpiprazole 2mg  daily following husband's passing in June 2019. Pt reports that both medications are working well for her and expressed that her doctor recently lowered her citalopram dose over heart concerns. Pt reports no issues or concerning side effects.   6. GERD Pt with hx of GERD on pantoprazole 40mg  daily. Pt reports that pantoprazole has been beneficial for heartburn and reports no concerns.  RTC: 1y (06/21/2020)  Ladoris Gene, PharmD Candidate [facility]  Cosigned by: Netta Neat, PharmD, [facility]) [phone]

## 2019-06-23 ENCOUNTER — Other Ambulatory Visit: Payer: Self-pay

## 2019-06-30 ENCOUNTER — Telehealth: Payer: Self-pay | Admitting: Pharmacist

## 2019-06-30 ENCOUNTER — Emergency Department: Admit: 2019-06-30 | Discharge: 2019-06-30 | Disposition: A | Discharge status: Home / Self Care | Payer: MEDICAID

## 2019-06-30 ENCOUNTER — Encounter: Admit: 2019-06-30 | Discharge: 2019-06-30 | Disposition: A | Discharge status: Home / Self Care | Payer: MEDICAID

## 2019-06-30 DIAGNOSIS — R1010 Upper abdominal pain, unspecified: Principal | ICD-10-CM

## 2019-06-30 DIAGNOSIS — R140 Abdominal distension (gaseous): Secondary | ICD-10-CM

## 2019-06-30 DIAGNOSIS — R197 Diarrhea, unspecified: Secondary | ICD-10-CM

## 2019-06-30 NOTE — Telephone Encounter (Signed)
06/30/2019 9:03:39 AM - Victoza & tips refill to [facility]  06/30/2019 Received refill request for Victoza & tips back from [facility], faxed to [employer] for refill.[name]

## 2019-07-01 ENCOUNTER — Other Ambulatory Visit: Payer: Self-pay

## 2019-07-01 MED ORDER — INSULIN REGULAR HUMAN 100 UNIT/ML IJ SOLN
10.00 | INTRAMUSCULAR | Status: DC
Start: 2019-06-30 — End: 2019-07-01

## 2019-07-01 MED ORDER — SUCRALFATE 1 GM/10ML PO SUSP
1.00 | ORAL | Status: DC
Start: 2019-06-30 — End: 2019-07-01

## 2019-07-06 ENCOUNTER — Other Ambulatory Visit: Payer: Self-pay | Admitting: Internal Medicine

## 2019-07-06 DIAGNOSIS — I10 Essential (primary) hypertension: Secondary | ICD-10-CM

## 2019-08-11 ENCOUNTER — Telehealth: Payer: Self-pay | Admitting: Pharmacist

## 2019-08-11 NOTE — Telephone Encounter (Signed)
08/11/2019 8:06:09 AM - Lantus Vials refill  08/11/2019 [employer] refill request for Lantus Vials Inject 42 units daily #4, to [facility] for Dr. Mable Fill to sign.Abigail Molina

## 2019-08-30 ENCOUNTER — Telehealth: Payer: Self-pay | Admitting: Gerontology

## 2019-08-30 NOTE — Telephone Encounter (Signed)
Called pt on 9/29 @2 :25 pm to schedule appointment. Appointment scheduled for 10/7.

## 2019-09-02 ENCOUNTER — Telehealth: Payer: Self-pay | Admitting: Pharmacist

## 2019-09-02 NOTE — Telephone Encounter (Signed)
09/02/2019 10:16:48 AM - Lantus refill faxed to [facility]  09/02/2019 Faxed [employer] refill request for Lantus Vials Inject 42 units once daily #4.[name]

## 2019-09-07 ENCOUNTER — Ambulatory Visit: Payer: Self-pay | Admitting: Gerontology

## 2019-09-21 ENCOUNTER — Ambulatory Visit: Admit: 2019-09-21 | Discharge: 2019-09-22

## 2019-09-21 DIAGNOSIS — L409 Psoriasis, unspecified: Principal | ICD-10-CM

## 2019-09-21 DIAGNOSIS — Z79899 Other long term (current) drug therapy: Principal | ICD-10-CM

## 2019-09-23 ENCOUNTER — Other Ambulatory Visit: Admit: 2019-09-23 | Discharge: 2019-09-24

## 2019-10-06 ENCOUNTER — Other Ambulatory Visit: Payer: Self-pay

## 2019-10-17 ENCOUNTER — Ambulatory Visit (LOCAL_COMMUNITY_HEALTH_CENTER): Payer: Self-pay

## 2019-10-17 ENCOUNTER — Other Ambulatory Visit: Payer: Self-pay

## 2019-10-17 DIAGNOSIS — Z111 Encounter for screening for respiratory tuberculosis: Secondary | ICD-10-CM

## 2019-10-20 ENCOUNTER — Ambulatory Visit (LOCAL_COMMUNITY_HEALTH_CENTER): Payer: Medicaid Other

## 2019-10-20 ENCOUNTER — Other Ambulatory Visit: Payer: Self-pay

## 2019-10-20 DIAGNOSIS — L409 Psoriasis, unspecified: Principal | ICD-10-CM

## 2019-10-20 DIAGNOSIS — Z111 Encounter for screening for respiratory tuberculosis: Secondary | ICD-10-CM

## 2019-10-20 LAB — TB SKIN TEST
Induration: 0 mm
TB Skin Test: NEGATIVE

## 2019-10-20 MED ORDER — COSENTYX PEN 300 MG/2 PENS (150 MG/ML) SUBCUTANEOUS
INJECTION | PRN refills | 0 days | Status: CP
Start: 2019-10-20 — End: ?

## 2019-10-21 ENCOUNTER — Other Ambulatory Visit: Payer: Self-pay | Admitting: Gerontology

## 2019-10-21 DIAGNOSIS — L409 Psoriasis, unspecified: Principal | ICD-10-CM

## 2019-10-21 DIAGNOSIS — I10 Essential (primary) hypertension: Secondary | ICD-10-CM

## 2019-10-21 DIAGNOSIS — K2100 Gastro-esophageal reflux disease with esophagitis, without bleeding: Secondary | ICD-10-CM

## 2019-10-21 DIAGNOSIS — Z794 Long term (current) use of insulin: Secondary | ICD-10-CM

## 2019-10-21 DIAGNOSIS — E119 Type 2 diabetes mellitus without complications: Secondary | ICD-10-CM

## 2019-10-21 DIAGNOSIS — E7800 Pure hypercholesterolemia, unspecified: Secondary | ICD-10-CM

## 2019-11-07 ENCOUNTER — Telehealth: Payer: Self-pay | Admitting: Pharmacist

## 2019-11-07 NOTE — Telephone Encounter (Signed)
11/07/2019 11:10:54 AM - Victoza & tips refill to [facility]  11/07/2019 Printed [employer] refill request for [employer] 1.2mg  once daily #4 & Novofine 32G tips #2--Sending to [facility] for Dr. Mable Fill to sign & return.[name]

## 2019-11-10 ENCOUNTER — Ambulatory Visit: Admit: 2019-11-10 | Discharge: 2019-11-11 | Attending: Family | Primary: Family

## 2019-11-10 DIAGNOSIS — Z794 Long term (current) use of insulin: Principal | ICD-10-CM

## 2019-11-10 DIAGNOSIS — I10 Essential (primary) hypertension: Principal | ICD-10-CM

## 2019-11-10 DIAGNOSIS — R945 Abnormal results of liver function studies: Principal | ICD-10-CM

## 2019-11-10 DIAGNOSIS — E1169 Type 2 diabetes mellitus with other specified complication: Principal | ICD-10-CM

## 2019-11-10 DIAGNOSIS — Z0000 Encounter for general adult medical examination without abnormal findings: Principal | ICD-10-CM

## 2019-11-10 DIAGNOSIS — R319 Hematuria, unspecified: Principal | ICD-10-CM

## 2019-11-10 DIAGNOSIS — R1013 Epigastric pain: Principal | ICD-10-CM

## 2019-11-10 MED ORDER — PANTOPRAZOLE 40 MG TABLET,DELAYED RELEASE
ORAL_TABLET | Freq: Two times a day (BID) | ORAL | 1 refills | 45 days | Status: CP
Start: 2019-11-10 — End: ?

## 2019-11-14 ENCOUNTER — Telehealth: Payer: Self-pay | Admitting: Pharmacist

## 2019-11-14 NOTE — Telephone Encounter (Signed)
11/14/2019 1:50:27 PM - Victoza & tips refill to [employer]  11/14/2019 [facility] signed forms for Dr. Georganna Skeans Inject 12mg  once daily #4 boxes & Novofine 32G tips #2 boxes for refill faxed to [employer].[name]

## 2019-12-02 DIAGNOSIS — K760 Fatty (change of) liver, not elsewhere classified: Secondary | ICD-10-CM

## 2019-12-02 HISTORY — DX: Fatty (change of) liver, not elsewhere classified: K76.0

## 2019-12-06 ENCOUNTER — Telehealth: Payer: Self-pay | Admitting: Pharmacist

## 2019-12-06 NOTE — Telephone Encounter (Signed)
12/06/2019 Faxed [employer] refill request for Lantus Vials Inject 42 units once daily #4, also provider change now [facility] sent page 4 of application also.[name]

## 2019-12-08 ENCOUNTER — Ambulatory Visit: Payer: Medicaid Other | Admitting: Ophthalmology

## 2019-12-23 DIAGNOSIS — L409 Psoriasis, unspecified: Principal | ICD-10-CM

## 2019-12-23 MED ORDER — COSENTYX PEN 300 MG/2 PENS (150 MG/ML) SUBCUTANEOUS
SUBCUTANEOUS | PRN refills | 7.00000 days | Status: CP
Start: 2019-12-23 — End: 2021-01-26

## 2020-01-25 ENCOUNTER — Other Ambulatory Visit: Payer: Self-pay | Admitting: Gerontology

## 2020-01-25 DIAGNOSIS — E7800 Pure hypercholesterolemia, unspecified: Secondary | ICD-10-CM

## 2020-01-25 DIAGNOSIS — K2100 Gastro-esophageal reflux disease with esophagitis, without bleeding: Secondary | ICD-10-CM

## 2020-01-25 DIAGNOSIS — I10 Essential (primary) hypertension: Secondary | ICD-10-CM

## 2020-01-30 ENCOUNTER — Encounter: Admit: 2020-01-30 | Discharge: 2020-01-31 | Disposition: A | Discharge status: Home / Self Care | Payer: MEDICAID

## 2020-02-09 ENCOUNTER — Ambulatory Visit: Admit: 2020-02-09 | Discharge: 2020-02-10 | Attending: Family | Primary: Family

## 2020-02-09 ENCOUNTER — Ambulatory Visit: Admit: 2020-02-09 | Discharge: 2020-02-10

## 2020-02-09 DIAGNOSIS — S52552A Other extraarticular fracture of lower end of left radius, initial encounter for closed fracture: Principal | ICD-10-CM

## 2020-02-19 ENCOUNTER — Encounter: Admit: 2020-02-19 | Discharge: 2020-02-20 | Disposition: A | Discharge status: Home / Self Care | Payer: MEDICAID

## 2020-02-19 DIAGNOSIS — R1010 Upper abdominal pain, unspecified: Principal | ICD-10-CM

## 2020-02-19 DIAGNOSIS — B372 Candidiasis of skin and nail: Principal | ICD-10-CM

## 2020-02-19 DIAGNOSIS — A599 Trichomoniasis, unspecified: Principal | ICD-10-CM

## 2020-02-19 MED ORDER — NYSTATIN 100,000 UNIT/GRAM TOPICAL POWDER
Freq: Four times a day (QID) | TOPICAL | 2 refills | 8 days | Status: CP
Start: 2020-02-19 — End: ?
  Filled 2020-02-21: qty 15, 8d supply, fill #0

## 2020-02-19 MED ORDER — SUCRALFATE 1 GRAM TABLET
ORAL_TABLET | Freq: Four times a day (QID) | ORAL | 0 refills | 14 days | Status: CP
Start: 2020-02-19 — End: 2020-03-04
  Filled 2020-02-21: qty 56, 14d supply, fill #0

## 2020-02-19 MED ORDER — METRONIDAZOLE 500 MG TABLET
ORAL_TABLET | Freq: Two times a day (BID) | ORAL | 0 refills | 7 days | Status: CP
Start: 2020-02-19 — End: 2020-02-26
  Filled 2020-02-21: qty 14, 7d supply, fill #0

## 2020-02-21 MED FILL — SUCRALFATE 1 GRAM TABLET: 14 days supply | Qty: 56 | Fill #0 | Status: AC

## 2020-02-21 MED FILL — NYSTOP 100,000 UNIT/GRAM TOPICAL POWDER: 8 days supply | Qty: 15 | Fill #0 | Status: AC

## 2020-02-21 MED FILL — METRONIDAZOLE 500 MG TABLET: 7 days supply | Qty: 14 | Fill #0 | Status: AC

## 2020-02-24 ENCOUNTER — Telehealth: Payer: Self-pay | Admitting: Pharmacy Technician

## 2020-02-24 NOTE — Telephone Encounter (Signed)
Received updated proof of income.  Patient eligible to receive medication assistance at [facility] until time for re-certification in 9359, and as long as eligibility requirements continue to be met.  [place] [facility]

## 2020-02-28 ENCOUNTER — Ambulatory Visit: Admit: 2020-02-28 | Discharge: 2020-02-29

## 2020-03-01 ENCOUNTER — Other Ambulatory Visit: Payer: Self-pay | Admitting: Gerontology

## 2020-03-01 DIAGNOSIS — I10 Essential (primary) hypertension: Secondary | ICD-10-CM

## 2020-03-05 ENCOUNTER — Telehealth: Payer: Self-pay | Admitting: Pharmacist

## 2020-03-05 NOTE — Telephone Encounter (Signed)
03/05/2020 3:51:47 PM - Lantus vials refill to [facility] - Monday, March 05, 2020 3:51 PM --Faxed [employer] refill for Lantus Vials Inject 42 units daily #4.

## 2020-03-13 ENCOUNTER — Ambulatory Visit: Admit: 2020-03-13 | Discharge: 2020-03-14

## 2020-03-20 ENCOUNTER — Other Ambulatory Visit: Payer: Self-pay | Admitting: Gerontology

## 2020-03-20 DIAGNOSIS — I10 Essential (primary) hypertension: Secondary | ICD-10-CM

## 2020-04-02 ENCOUNTER — Telehealth: Payer: Self-pay | Admitting: Pharmacist

## 2020-04-02 ENCOUNTER — Other Ambulatory Visit: Payer: Self-pay | Admitting: Psychiatry

## 2020-04-02 NOTE — Telephone Encounter (Signed)
04/02/2020 11:50:37 AM - Victoza & tips pending  -- Elmer Picker - Monday, Apr 02, 2020 11:49 AM --I have received the provider portion of [facility] renewal application back, holding for patient to return his portion.

## 2020-04-26 ENCOUNTER — Other Ambulatory Visit: Payer: Self-pay | Admitting: Gerontology

## 2020-04-26 ENCOUNTER — Other Ambulatory Visit: Payer: Self-pay | Admitting: Urology

## 2020-04-26 DIAGNOSIS — K2100 Gastro-esophageal reflux disease with esophagitis, without bleeding: Secondary | ICD-10-CM

## 2020-04-26 DIAGNOSIS — Z794 Long term (current) use of insulin: Secondary | ICD-10-CM

## 2020-04-26 DIAGNOSIS — E7800 Pure hypercholesterolemia, unspecified: Secondary | ICD-10-CM

## 2020-04-30 ENCOUNTER — Encounter: Admit: 2020-04-30 | Discharge: 2020-05-01 | Disposition: A | Discharge status: Home / Self Care | Payer: MEDICAID

## 2020-04-30 DIAGNOSIS — B349 Viral infection, unspecified: Principal | ICD-10-CM

## 2020-05-03 ENCOUNTER — Institutional Professional Consult (permissible substitution): Admit: 2020-05-03 | Discharge: 2020-05-04 | Payer: MEDICAID

## 2020-05-03 DIAGNOSIS — J0100 Acute maxillary sinusitis, unspecified: Principal | ICD-10-CM

## 2020-05-03 MED ORDER — AZITHROMYCIN 250 MG TABLET
ORAL_TABLET | 0 refills | 0 days | Status: CP
Start: 2020-05-03 — End: ?
  Filled 2020-05-04: qty 6, 5d supply, fill #0

## 2020-05-04 MED FILL — AZITHROMYCIN 250 MG TABLET: 5 days supply | Qty: 6 | Fill #0 | Status: AC

## 2020-05-14 ENCOUNTER — Ambulatory Visit: Admit: 2020-05-14 | Discharge: 2020-05-14 | Disposition: A | Discharge status: Home / Self Care | Payer: MEDICAID

## 2020-05-14 DIAGNOSIS — R739 Hyperglycemia, unspecified: Principal | ICD-10-CM

## 2020-05-23 ENCOUNTER — Ambulatory Visit: Admit: 2020-05-23 | Discharge: 2020-05-24 | Payer: MEDICAID | Attending: Family | Primary: Family

## 2020-05-23 DIAGNOSIS — R1310 Dysphagia, unspecified: Principal | ICD-10-CM

## 2020-05-23 DIAGNOSIS — Z23 Encounter for immunization: Principal | ICD-10-CM

## 2020-05-23 DIAGNOSIS — Z794 Long term (current) use of insulin: Principal | ICD-10-CM

## 2020-05-23 DIAGNOSIS — E785 Hyperlipidemia, unspecified: Principal | ICD-10-CM

## 2020-05-23 DIAGNOSIS — E1169 Type 2 diabetes mellitus with other specified complication: Principal | ICD-10-CM

## 2020-05-23 DIAGNOSIS — I10 Essential (primary) hypertension: Principal | ICD-10-CM

## 2020-05-23 DIAGNOSIS — R1013 Epigastric pain: Principal | ICD-10-CM

## 2020-05-23 MED ORDER — METFORMIN ER 500 MG TABLET,EXTENDED RELEASE 24 HR
ORAL_TABLET | Freq: Two times a day (BID) | ORAL | 0 refills | 45 days | Status: CP
Start: 2020-05-23 — End: ?
  Filled 2020-05-23: qty 180, 45d supply, fill #0

## 2020-05-23 MED ORDER — BLOOD-GLUCOSE METER KIT WRAPPER
0 refills | 0 days | Status: CP
Start: 2020-05-23 — End: 2021-05-23
  Filled 2020-05-23: qty 1, 30d supply, fill #0

## 2020-05-23 MED ORDER — LISINOPRIL 40 MG TABLET
ORAL_TABLET | Freq: Every day | ORAL | 0 refills | 90 days | Status: CP
Start: 2020-05-23 — End: ?
  Filled 2020-05-23: qty 90, 90d supply, fill #0

## 2020-05-23 MED ORDER — BLOOD SUGAR DIAGNOSTIC STRIPS
Freq: Three times a day (TID) | 2 refills | 0 days | Status: CP
Start: 2020-05-23 — End: ?

## 2020-05-23 MED ORDER — INSULIN GLARGINE (U-100) 100 UNIT/ML SUBCUTANEOUS SOLUTION
0 refills | 0 days | Status: CP
Start: 2020-05-23 — End: ?
  Filled 2020-05-23: qty 10, 17d supply, fill #0

## 2020-05-23 MED ORDER — LIRAGLUTIDE 0.6 MG/0.1 ML (18 MG/3 ML) SUBCUTANEOUS PEN INJECTOR
Freq: Every day | SUBCUTANEOUS | 0 refills | 30 days | Status: CP
Start: 2020-05-23 — End: ?
  Filled 2020-05-23: qty 6, 30d supply, fill #0

## 2020-05-23 MED ORDER — SIMVASTATIN 20 MG TABLET
ORAL_TABLET | Freq: Every evening | ORAL | 0 refills | 90 days | Status: CP
Start: 2020-05-23 — End: ?
  Filled 2020-05-23: qty 90, 90d supply, fill #0

## 2020-05-23 MED ORDER — PANTOPRAZOLE 40 MG TABLET,DELAYED RELEASE
ORAL_TABLET | Freq: Two times a day (BID) | ORAL | 1 refills | 45 days | Status: CP
Start: 2020-05-23 — End: ?
  Filled 2020-05-23: qty 90, 83d supply, fill #0

## 2020-05-23 MED FILL — ON CALL EXPRESS METER: 30 days supply | Qty: 1 | Fill #0 | Status: AC

## 2020-05-23 MED FILL — LISINOPRIL 40 MG TABLET: 90 days supply | Qty: 90 | Fill #0 | Status: AC

## 2020-05-23 MED FILL — VICTOZA 2-PAK 0.6 MG/0.1 ML (18 MG/3 ML) SUBCUTANEOUS PEN INJECTOR: 30 days supply | Qty: 6 | Fill #0 | Status: AC

## 2020-05-23 MED FILL — METFORMIN ER 500 MG TABLET,EXTENDED RELEASE 24 HR: 45 days supply | Qty: 180 | Fill #0 | Status: AC

## 2020-05-23 MED FILL — LANTUS U-100 INSULIN 100 UNIT/ML SUBCUTANEOUS SOLUTION: 17 days supply | Qty: 10 | Fill #0 | Status: AC

## 2020-05-23 MED FILL — ON CALL EXPRESS TEST STRIP: 33 days supply | Qty: 100 | Fill #0 | Status: AC

## 2020-05-23 MED FILL — PANTOPRAZOLE 40 MG TABLET,DELAYED RELEASE: 83 days supply | Qty: 90 | Fill #0 | Status: AC

## 2020-05-23 MED FILL — SIMVASTATIN 20 MG TABLET: 90 days supply | Qty: 90 | Fill #0 | Status: AC

## 2020-05-23 MED FILL — ON CALL EXPRESS TEST STRIP: 33 days supply | Qty: 100 | Fill #0

## 2020-06-01 MED ORDER — LANCETS
3 refills | 0 days | Status: CP
Start: 2020-06-01 — End: ?

## 2020-06-06 ENCOUNTER — Ambulatory Visit: Admit: 2020-06-06 | Discharge: 2020-06-07

## 2020-06-06 DIAGNOSIS — B372 Candidiasis of skin and nail: Principal | ICD-10-CM

## 2020-06-06 DIAGNOSIS — B373 Candidiasis of vulva and vagina: Principal | ICD-10-CM

## 2020-06-06 MED ORDER — CLOTRIMAZOLE 1 % TOPICAL CREAM
Freq: Two times a day (BID) | TOPICAL | 1 refills | 0.00000 days | Status: CP
Start: 2020-06-06 — End: 2020-06-13

## 2020-06-06 MED ORDER — CLOTRIMAZOLE 1 % VAGINAL CREAM
Freq: Two times a day (BID) | VAGINAL | 1 refills | 0 days | Status: CP
Start: 2020-06-06 — End: 2020-06-13
  Filled 2020-06-11: qty 45, 7d supply, fill #0

## 2020-06-06 MED ORDER — FLUCONAZOLE 150 MG TABLET
ORAL_TABLET | Freq: Once | ORAL | 1 refills | 1 days | Status: CP
Start: 2020-06-06 — End: 2020-06-06
  Filled 2020-06-11: qty 1, 1d supply, fill #0

## 2020-06-11 MED FILL — FLUCONAZOLE 150 MG TABLET: 1 days supply | Qty: 1 | Fill #0 | Status: AC

## 2020-06-11 MED FILL — CLOTRIMAZOLE 1 % TOPICAL CREAM: TOPICAL | 7 days supply | Qty: 60 | Fill #0

## 2020-06-11 MED FILL — CLOTRIMAZOLE 1 % TOPICAL CREAM: 7 days supply | Qty: 60 | Fill #0 | Status: AC

## 2020-06-11 MED FILL — CLOTRIMAZOLE 1 % VAGINAL CREAM: 7 days supply | Qty: 45 | Fill #0 | Status: AC

## 2020-07-24 DIAGNOSIS — E1169 Type 2 diabetes mellitus with other specified complication: Principal | ICD-10-CM

## 2020-07-24 DIAGNOSIS — Z794 Long term (current) use of insulin: Principal | ICD-10-CM

## 2020-07-24 MED ORDER — LANTUS U-100 INSULIN 100 UNIT/ML SUBCUTANEOUS SOLUTION
0 refills | 0 days | Status: CP
Start: 2020-07-24 — End: 2021-07-24
  Filled 2020-07-26: qty 10, 17d supply, fill #0

## 2020-07-26 MED FILL — LANTUS U-100 INSULIN 100 UNIT/ML SUBCUTANEOUS SOLUTION: 17 days supply | Qty: 10 | Fill #0 | Status: AC

## 2020-08-03 ENCOUNTER — Encounter: Admit: 2020-08-03 | Discharge: 2020-08-04 | Disposition: A | Discharge status: Home / Self Care | Payer: MEDICAID

## 2020-08-03 ENCOUNTER — Ambulatory Visit: Admit: 2020-08-03 | Discharge: 2020-08-04

## 2020-08-03 DIAGNOSIS — R109 Unspecified abdominal pain: Principal | ICD-10-CM

## 2020-08-03 DIAGNOSIS — R399 Unspecified symptoms and signs involving the genitourinary system: Principal | ICD-10-CM

## 2020-08-03 MED ORDER — ONDANSETRON HCL 4 MG TABLET
ORAL_TABLET | Freq: Four times a day (QID) | ORAL | 0 refills | 3 days | Status: CP
Start: 2020-08-03 — End: 2020-08-06

## 2020-08-07 ENCOUNTER — Ambulatory Visit: Admit: 2020-08-07 | Discharge: 2020-08-08

## 2020-08-07 DIAGNOSIS — R1012 Left upper quadrant pain: Principal | ICD-10-CM

## 2020-08-07 DIAGNOSIS — R112 Nausea with vomiting, unspecified: Principal | ICD-10-CM

## 2020-08-07 DIAGNOSIS — R1011 Right upper quadrant pain: Principal | ICD-10-CM

## 2020-08-10 DIAGNOSIS — Z794 Long term (current) use of insulin: Principal | ICD-10-CM

## 2020-08-10 DIAGNOSIS — E1169 Type 2 diabetes mellitus with other specified complication: Principal | ICD-10-CM

## 2020-08-10 DIAGNOSIS — I10 Essential (primary) hypertension: Principal | ICD-10-CM

## 2020-08-10 DIAGNOSIS — E785 Hyperlipidemia, unspecified: Principal | ICD-10-CM

## 2020-08-10 MED ORDER — SIMVASTATIN 20 MG TABLET
ORAL_TABLET | Freq: Every evening | ORAL | 0 refills | 90 days | Status: CP
Start: 2020-08-10 — End: ?
  Filled 2020-08-16: qty 30, 30d supply, fill #0

## 2020-08-10 MED ORDER — LISINOPRIL 40 MG TABLET
ORAL_TABLET | Freq: Every day | ORAL | 0 refills | 90.00000 days | Status: CP
Start: 2020-08-10 — End: ?
  Filled 2020-08-16: qty 30, 30d supply, fill #0

## 2020-08-10 MED ORDER — METFORMIN ER 500 MG TABLET,EXTENDED RELEASE 24 HR
ORAL_TABLET | Freq: Two times a day (BID) | ORAL | 0 refills | 45 days | Status: CP
Start: 2020-08-10 — End: ?
  Filled 2020-08-16: qty 120, 30d supply, fill #0

## 2020-08-13 MED FILL — PANTOPRAZOLE 40 MG TABLET,DELAYED RELEASE: ORAL | 83 days supply | Qty: 90 | Fill #1

## 2020-08-13 MED FILL — PANTOPRAZOLE 40 MG TABLET,DELAYED RELEASE: 83 days supply | Qty: 90 | Fill #1 | Status: AC

## 2020-08-14 MED FILL — ON CALL EXPRESS TEST STRIP: 33 days supply | Qty: 100 | Fill #1 | Status: AC

## 2020-08-14 MED FILL — ON CALL EXPRESS TEST STRIP: 33 days supply | Qty: 100 | Fill #1

## 2020-08-16 DIAGNOSIS — E1169 Type 2 diabetes mellitus with other specified complication: Principal | ICD-10-CM

## 2020-08-16 DIAGNOSIS — Z794 Long term (current) use of insulin: Principal | ICD-10-CM

## 2020-08-16 MED ORDER — LANTUS U-100 INSULIN 100 UNIT/ML SUBCUTANEOUS SOLUTION
0 refills | 0 days | Status: CP
Start: 2020-08-16 — End: 2021-08-16
  Filled 2020-08-20: qty 10, 18d supply, fill #0

## 2020-08-16 MED FILL — METFORMIN ER 500 MG TABLET,EXTENDED RELEASE 24 HR: 30 days supply | Qty: 120 | Fill #0 | Status: AC

## 2020-08-16 MED FILL — SIMVASTATIN 20 MG TABLET: 30 days supply | Qty: 30 | Fill #0 | Status: AC

## 2020-08-16 MED FILL — LISINOPRIL 40 MG TABLET: 30 days supply | Qty: 30 | Fill #0 | Status: AC

## 2020-08-20 ENCOUNTER — Ambulatory Visit: Admit: 2020-08-20 | Discharge: 2020-08-21 | Payer: MEDICAID | Attending: Gastroenterology | Primary: Gastroenterology

## 2020-08-20 ENCOUNTER — Ambulatory Visit: Admit: 2020-08-20 | Discharge: 2020-08-21 | Payer: MEDICAID | Attending: Otolaryngology | Primary: Otolaryngology

## 2020-08-20 MED ORDER — PROMETHAZINE 25 MG TABLET
ORAL_TABLET | Freq: Four times a day (QID) | ORAL | 1 refills | 12 days | Status: CP | PRN
Start: 2020-08-20 — End: 2020-09-19
  Filled 2020-08-23: qty 45, 12d supply, fill #0

## 2020-08-20 MED ORDER — PEG-ELECTROLYTE SOLUTION 420 GRAM ORAL SOLUTION
Freq: Once | ORAL | 0 refills | 1 days | Status: CP
Start: 2020-08-20 — End: 2020-08-21
  Filled 2020-08-21: qty 4000, 1d supply, fill #0

## 2020-08-20 MED FILL — LANTUS U-100 INSULIN 100 UNIT/ML SUBCUTANEOUS SOLUTION: 18 days supply | Qty: 10 | Fill #0 | Status: AC

## 2020-08-21 MED FILL — PEG-ELECTROLYTE SOLUTION 420 GRAM ORAL SOLUTION: 1 days supply | Qty: 4000 | Fill #0 | Status: AC

## 2020-08-22 ENCOUNTER — Ambulatory Visit: Admit: 2020-08-22 | Discharge: 2020-08-23 | Attending: Family | Primary: Family

## 2020-08-22 DIAGNOSIS — Z23 Encounter for immunization: Principal | ICD-10-CM

## 2020-08-22 DIAGNOSIS — Z794 Long term (current) use of insulin: Principal | ICD-10-CM

## 2020-08-22 DIAGNOSIS — E1169 Type 2 diabetes mellitus with other specified complication: Principal | ICD-10-CM

## 2020-08-22 MED ORDER — LANTUS U-100 INSULIN 100 UNIT/ML SUBCUTANEOUS SOLUTION
0 refills | 0 days | Status: CP
Start: 2020-08-22 — End: 2021-08-22

## 2020-08-22 MED ORDER — METFORMIN ER 500 MG TABLET,EXTENDED RELEASE 24 HR
ORAL_TABLET | Freq: Two times a day (BID) | ORAL | 3 refills | 45 days | Status: CP
Start: 2020-08-22 — End: ?
  Filled 2020-10-18: qty 180, 45d supply, fill #0

## 2020-08-23 MED FILL — PROMETHAZINE 25 MG TABLET: 12 days supply | Qty: 45 | Fill #0 | Status: AC

## 2020-09-03 DIAGNOSIS — E1169 Type 2 diabetes mellitus with other specified complication: Principal | ICD-10-CM

## 2020-09-03 DIAGNOSIS — Z794 Long term (current) use of insulin: Principal | ICD-10-CM

## 2020-09-03 MED ORDER — LANTUS U-100 INSULIN 100 UNIT/ML SUBCUTANEOUS SOLUTION
4 refills | 0 days | Status: CP
Start: 2020-09-03 — End: 2021-09-03
  Filled 2020-10-18: qty 10, 15d supply, fill #0

## 2020-09-06 ENCOUNTER — Ambulatory Visit: Admit: 2020-09-06 | Discharge: 2020-09-06 | Payer: MEDICAID

## 2020-09-06 ENCOUNTER — Encounter: Admit: 2020-09-06 | Discharge: 2020-09-06 | Attending: Anesthesiology | Primary: Anesthesiology

## 2020-09-06 LAB — HM COLONOSCOPY

## 2020-10-05 DIAGNOSIS — Z794 Long term (current) use of insulin: Principal | ICD-10-CM

## 2020-10-05 DIAGNOSIS — E1169 Type 2 diabetes mellitus with other specified complication: Principal | ICD-10-CM

## 2020-10-11 DIAGNOSIS — L409 Psoriasis, unspecified: Principal | ICD-10-CM

## 2020-10-18 MED FILL — ON CALL EXPRESS TEST STRIP: 33 days supply | Qty: 100 | Fill #2

## 2020-10-18 MED FILL — LANTUS U-100 INSULIN 100 UNIT/ML SUBCUTANEOUS SOLUTION: 15 days supply | Qty: 10 | Fill #0 | Status: AC

## 2020-10-18 MED FILL — LISINOPRIL 40 MG TABLET: ORAL | 30 days supply | Qty: 30 | Fill #1

## 2020-10-18 MED FILL — LISINOPRIL 40 MG TABLET: 30 days supply | Qty: 30 | Fill #1 | Status: AC

## 2020-10-18 MED FILL — SIMVASTATIN 20 MG TABLET: 30 days supply | Qty: 30 | Fill #1 | Status: AC

## 2020-10-18 MED FILL — METFORMIN ER 500 MG TABLET,EXTENDED RELEASE 24 HR: 45 days supply | Qty: 180 | Fill #0 | Status: AC

## 2020-10-18 MED FILL — SIMVASTATIN 20 MG TABLET: ORAL | 30 days supply | Qty: 30 | Fill #1

## 2020-10-18 MED FILL — ON CALL EXPRESS TEST STRIP: 33 days supply | Qty: 100 | Fill #2 | Status: AC

## 2020-11-05 MED FILL — LANTUS U-100 INSULIN 100 UNIT/ML SUBCUTANEOUS SOLUTION: 15 days supply | Qty: 10 | Fill #1 | Status: AC

## 2020-11-05 MED FILL — LANTUS U-100 INSULIN 100 UNIT/ML SUBCUTANEOUS SOLUTION: 15 days supply | Qty: 10 | Fill #1

## 2020-11-13 MED FILL — LISINOPRIL 40 MG TABLET: 30 days supply | Qty: 30 | Fill #2 | Status: AC

## 2020-11-13 MED FILL — SIMVASTATIN 20 MG TABLET: ORAL | 30 days supply | Qty: 30 | Fill #2

## 2020-11-13 MED FILL — SIMVASTATIN 20 MG TABLET: 30 days supply | Qty: 30 | Fill #2 | Status: AC

## 2020-11-13 MED FILL — LISINOPRIL 40 MG TABLET: ORAL | 30 days supply | Qty: 30 | Fill #2

## 2020-11-19 MED FILL — LANTUS U-100 INSULIN 100 UNIT/ML SUBCUTANEOUS SOLUTION: 45 days supply | Qty: 30 | Fill #2

## 2020-11-19 MED FILL — LANTUS U-100 INSULIN 100 UNIT/ML SUBCUTANEOUS SOLUTION: 45 days supply | Qty: 30 | Fill #2 | Status: AC

## 2020-11-26 DIAGNOSIS — R1013 Epigastric pain: Principal | ICD-10-CM

## 2020-11-27 ENCOUNTER — Ambulatory Visit: Admit: 2020-11-27 | Discharge: 2020-11-28 | Payer: MEDICAID | Attending: Family | Primary: Family

## 2020-11-27 DIAGNOSIS — E1169 Type 2 diabetes mellitus with other specified complication: Principal | ICD-10-CM

## 2020-11-27 DIAGNOSIS — Z1231 Encounter for screening mammogram for malignant neoplasm of breast: Principal | ICD-10-CM

## 2020-11-27 DIAGNOSIS — R1013 Epigastric pain: Principal | ICD-10-CM

## 2020-11-27 DIAGNOSIS — Z794 Long term (current) use of insulin: Principal | ICD-10-CM

## 2020-11-27 DIAGNOSIS — N3281 Overactive bladder: Principal | ICD-10-CM

## 2020-11-27 MED ORDER — PANTOPRAZOLE 40 MG TABLET,DELAYED RELEASE
ORAL_TABLET | Freq: Two times a day (BID) | ORAL | 1 refills | 45.00000 days | Status: CP
Start: 2020-11-27 — End: 2020-11-27
  Filled 2020-11-27: qty 180, 90d supply, fill #0

## 2020-11-27 MED ORDER — OXYBUTYNIN CHLORIDE ER 5 MG TABLET,EXTENDED RELEASE 24 HR
ORAL_TABLET | Freq: Every day | ORAL | 3 refills | 30.00000 days | Status: CP
Start: 2020-11-27 — End: 2021-11-27

## 2020-11-27 MED ORDER — PANTOPRAZOLE 40 MG TABLET,DELAYED RELEASE: 40 mg | tablet | Freq: Two times a day (BID) | 1 refills | 45 days | Status: AC

## 2020-11-27 MED FILL — METFORMIN ER 500 MG TABLET,EXTENDED RELEASE 24 HR: 90 days supply | Qty: 360 | Fill #1 | Status: AC

## 2020-11-27 MED FILL — PANTOPRAZOLE 40 MG TABLET,DELAYED RELEASE: 90 days supply | Qty: 180 | Fill #0 | Status: AC

## 2020-11-27 MED FILL — METFORMIN ER 500 MG TABLET,EXTENDED RELEASE 24 HR: ORAL | 90 days supply | Qty: 360 | Fill #1

## 2020-11-30 DIAGNOSIS — E785 Hyperlipidemia, unspecified: Principal | ICD-10-CM

## 2020-11-30 DIAGNOSIS — I10 Essential (primary) hypertension: Principal | ICD-10-CM

## 2020-12-02 MED ORDER — LISINOPRIL 40 MG TABLET
ORAL_TABLET | Freq: Every day | ORAL | 1 refills | 90 days | Status: CP
Start: 2020-12-02 — End: 2021-12-02
  Filled 2020-12-07: qty 90, 90d supply, fill #0

## 2020-12-02 MED ORDER — SIMVASTATIN 20 MG TABLET
ORAL_TABLET | Freq: Every evening | ORAL | 1 refills | 90 days | Status: CP
Start: 2020-12-02 — End: 2021-12-02
  Filled 2020-12-07: qty 90, 90d supply, fill #0

## 2020-12-06 MED ORDER — TOLTERODINE ER 4 MG CAPSULE,EXTENDED RELEASE 24 HR
ORAL_CAPSULE | Freq: Every day | ORAL | 2 refills | 30 days | Status: CP
Start: 2020-12-06 — End: 2021-12-06
  Filled 2020-12-11: qty 30, 30d supply, fill #0

## 2020-12-10 DIAGNOSIS — Z794 Long term (current) use of insulin: Principal | ICD-10-CM

## 2020-12-10 DIAGNOSIS — E1169 Type 2 diabetes mellitus with other specified complication: Principal | ICD-10-CM

## 2020-12-10 MED ORDER — VICTOZA 2-PAK 0.6 MG/0.1 ML (18 MG/3 ML) SUBCUTANEOUS PEN INJECTOR
Freq: Every day | SUBCUTANEOUS | 0 refills | 30.00000 days | Status: CP
Start: 2020-12-10 — End: 2020-12-11

## 2020-12-11 ENCOUNTER — Ambulatory Visit: Admit: 2020-12-11 | Discharge: 2020-12-12 | Payer: MEDICAID

## 2020-12-11 DIAGNOSIS — H10523 Angular blepharoconjunctivitis, bilateral: Principal | ICD-10-CM

## 2020-12-11 DIAGNOSIS — H524 Presbyopia: Principal | ICD-10-CM

## 2020-12-11 DIAGNOSIS — Z794 Long term (current) use of insulin: Principal | ICD-10-CM

## 2020-12-11 DIAGNOSIS — H52203 Unspecified astigmatism, bilateral: Principal | ICD-10-CM

## 2020-12-11 DIAGNOSIS — E1169 Type 2 diabetes mellitus with other specified complication: Principal | ICD-10-CM

## 2020-12-11 DIAGNOSIS — H5213 Myopia, bilateral: Principal | ICD-10-CM

## 2020-12-11 DIAGNOSIS — E1136 Type 2 diabetes mellitus with diabetic cataract: Principal | ICD-10-CM

## 2020-12-11 DIAGNOSIS — H04123 Dry eye syndrome of bilateral lacrimal glands: Principal | ICD-10-CM

## 2020-12-11 DIAGNOSIS — E113212 Type 2 diabetes mellitus with mild nonproliferative diabetic retinopathy with macular edema, left eye: Principal | ICD-10-CM

## 2020-12-11 MED ORDER — DULAGLUTIDE 1.5 MG/0.5 ML SUBCUTANEOUS PEN INJECTOR
SUBCUTANEOUS | 6 refills | 28 days | Status: CP
Start: 2020-12-11 — End: ?
  Filled 2020-12-12: qty 2, 28d supply, fill #0

## 2020-12-13 DIAGNOSIS — E1169 Type 2 diabetes mellitus with other specified complication: Principal | ICD-10-CM

## 2020-12-13 DIAGNOSIS — Z794 Long term (current) use of insulin: Principal | ICD-10-CM

## 2020-12-14 MED ORDER — ON CALL EXPRESS TEST STRIP
Freq: Three times a day (TID) | 1 refills | 0 days | Status: CP
Start: 2020-12-14 — End: 2021-12-14
  Filled 2020-12-18: qty 300, 100d supply, fill #0

## 2020-12-19 ENCOUNTER — Other Ambulatory Visit: Payer: Self-pay | Admitting: Nurse Practitioner

## 2020-12-21 DIAGNOSIS — H04123 Dry eye syndrome of bilateral lacrimal glands: Secondary | ICD-10-CM | POA: Insufficient documentation

## 2020-12-21 DIAGNOSIS — H10523 Angular blepharoconjunctivitis, bilateral: Secondary | ICD-10-CM | POA: Insufficient documentation

## 2020-12-21 DIAGNOSIS — E1136 Type 2 diabetes mellitus with diabetic cataract: Secondary | ICD-10-CM | POA: Insufficient documentation

## 2020-12-28 ENCOUNTER — Other Ambulatory Visit: Payer: Self-pay

## 2020-12-28 ENCOUNTER — Ambulatory Visit: Payer: Medicaid Other | Admitting: Pharmacist

## 2020-12-28 DIAGNOSIS — Z794 Long term (current) use of insulin: Principal | ICD-10-CM

## 2020-12-28 DIAGNOSIS — E1169 Type 2 diabetes mellitus with other specified complication: Principal | ICD-10-CM

## 2020-12-28 DIAGNOSIS — Z79899 Other long term (current) drug therapy: Secondary | ICD-10-CM

## 2020-12-28 NOTE — Progress Notes (Signed)
[facility] Visit Note  Patient: Abigail Molina MRN: [id number] Date of Birth: [birth year] PCP: Patient, No Pcp Per   Abigail Molina 55 y.o. female presents for a telephone visit for medication management today. Verified patient with two identifiers.   LMP 10/15/2016   Patient Information   Past Medical History:  Diagnosis Date  . Cancer [facility])    Endometrial cancer  . Depression   . Diabetes mellitus without complication ([place])   . Fatty liver disease, nonalcoholic 0630  . GERD (gastroesophageal reflux disease)   . Hyperlipidemia   . Hypertension   . Psoriasis 2015      Past Surgical History:  Procedure Laterality Date  . ABDOMINAL HYSTERECTOMY  2007  . CHOLECYSTECTOMY  2000     Family History  Problem Relation Age of Onset  . Diabetes Mother   . Hypertension Mother   . Depression Mother     New Diagnoses (since last visit): fatty liver disease, non alcoholic; overactive bladder  Family Support: Good  Lifestyle Diet: Breakfast: fruit Lunch: skips - not hungry Dinner: protein, vegetable, and sometimes bread  Drinks: water, diet Dr. Malachi Bonds    Current Exercise Habits: The patient has a physically strenuous job, but has no regular exercise apart from work.  Exercise limited by: orthopedic condition(s) (chronic knee pain)    Social History   Substance and Sexual Activity  Alcohol Use Yes   Comment:  Rarely. One or two drinks a year       Social History   Tobacco Use  Smoking Status Never Smoker  Smokeless Tobacco Never Used      Health Maintenance  Topic Date Due  . COVID-19 Vaccine (1) Never done  . FOOT EXAM  Never done  . HIV Screening  Never done  . PAP SMEAR-Modifier  Never done  . COLONOSCOPY (Pts 45-37yrs Insurance coverage will need to be confirmed)  Never done  . MAMMOGRAM  Never done  . HEMOGLOBIN A1C  05/19/2019  . OPHTHALMOLOGY EXAM  12/10/2019  . INFLUENZA VACCINE  07/01/2020  . TETANUS/TDAP  05/23/2030  .  PNEUMOCOCCAL POLYSACCHARIDE VACCINE AGE 57-64 HIGH RISK  Completed  . Hepatitis C Screening  Completed   Health Maintenance/Date Completed  Last ED visit: 08/03/20 Last Visit to PCP: 10/2020 Next Visit to PCP: march or April 2022 Specialist visit: February 2022 - endocrinilogist Dental Exam: a few years Eye Exam: 12/11/2020 Pelvic/PAP Exam: total hysterectomy Mammogram: 2 years ago Colonoscopy: 11/01/2014 Flu Vaccine: completed Pneumonia Vaccine: completed COVID-19 Vaccine: completed primary series; arranging to get booster  Shingrix Vaccine: pt states she will ask her doctor about it   Outpatient Encounter Medications as of 12/28/2020  Medication Sig  . aspirin 81 MG tablet Take 81 mg by mouth daily.  . brexpiprazole (REXULTI) 2 MG TABS tablet Take 2 mg by mouth.  . citalopram (CELEXA) 40 MG tablet Take 40 mg by mouth daily.   . Dulaglutide (TRULICITY) 1.5 ZS/0.1UX SOPN Inject 1.5 mg into the skin once a week. Thursdays  . insulin glargine (LANTUS) 100 UNIT/ML injection Inject 0.5 mLs (50 Units total) into the skin at bedtime. Inject 42 units once daily. (Patient taking differently: Inject 66 Units into the skin at bedtime.)  . Insulin Pen Needle 32G X 6 MM MISC 1 Syringe by Does not apply route. Use with Victoza.  Marland Kitchen lisinopril (ZESTRIL) 40 MG tablet TAKE ONE TABLET BY MOUTH EVERY DAY  . metFORMIN (GLUCOPHAGE) 1000 MG tablet TAKE ONE TABLET BY MOUTH 2  TIMES A DAY WITH A MEAL.  . naproxen sodium (ALEVE) 220 MG tablet Take 220 mg by mouth 2 (two) times daily as needed (pain).  . pantoprazole (PROTONIX) 40 MG tablet TAKE ONE TABLET BY MOUTH EVERY DAY  . simvastatin (ZOCOR) 20 MG tablet TAKE ONE TABLET BY MOUTH EVERY EVENING  . tolterodine (DETROL LA) 2 MG 24 hr capsule Take 2 mg by mouth daily.  . mometasone (ELOCON) 0.1 % ointment Apply topically daily. Apply as needed for psoriasis  . Multiple Vitamin (MULTIVITAMIN) tablet Take 1 tablet by mouth daily. (Patient not taking: Reported on  12/28/2020)  . oxyCODONE (ROXICODONE) 5 MG immediate release tablet 1 every 6-8 hours as needed for pain. (Patient not taking: No sig reported)  . VICTOZA 18 MG/3ML SOPN Inject 1.2 mg once daily (Patient not taking: Reported on 12/28/2020)   No facility-administered encounter medications on file as of 12/28/2020.    Assessment and Plan: HTN Pt takes lisinopril for hypertension and reports her most recent BP 110/65. She does not have a cuff at home but reports this was the value at her last appointment. Continue current regimen.   Diabetes Pt is currently taking metformin, lantus, and was switched from victoza to trulicity at her last visit. Her most recent A1c was 11.3% on 11/27/20. She checks her BG twice daily and stated it runs high but did not give specific values and then stated she is going to see the endocrinologist to see "what's going on". She has been on the trulicity for less than a month and has an upcoming appointment with the endocrinologist in 01/2021. She is unable to exercise due to chronic knee pain which she reports partial relief with Aleve. Based on what the pt reports to be eating, her diet sounds healthy and well balanced. Continue current regimen; anticipate A1c to improve with trulicity. Will defer to endocrinology for further management.   Hyperlipidemia Pt takes simvastatin and does not have any recent labs in her chart. Encouraged pt to follow low fat/cholesterol diet. Continue current regimen.   Depression Pt takes citalopram and brexpiprazole. She does not report any changes in mood or behavior and states the medications are working well for her. Continue current regimen.   GERD Pt takes pantoprazole and reports the medication is working well for her. No further complaints at this time. Continue current regimen.   Overactive bladder Pt takes tolterodine and states the medication is working well for her. She did report that her tongue feels "hot". I suspected maybe it  was dry possibly causing the "hot" feeling, however, pt reports this started before she began taking tolterodine and she plans to bring it up to her provider at her next appt.   Access/Adherence Pt uses a pillbox and endorses adherence. Pt requesting refill for brexpiprazole - will put order in.     Sherilyn Banker, PharmD Pharmacy Resident  12/28/2020 10:08 AM

## 2020-12-30 MED ORDER — LANTUS U-100 INSULIN 100 UNIT/ML SUBCUTANEOUS SOLUTION
Freq: Every evening | SUBCUTANEOUS | 4 refills | 15 days | Status: CP
Start: 2020-12-30 — End: 2021-12-28
  Filled 2020-12-31: qty 50, 75d supply, fill #0

## 2021-01-03 DIAGNOSIS — L409 Psoriasis, unspecified: Principal | ICD-10-CM

## 2021-01-07 MED FILL — TOLTERODINE ER 4 MG CAPSULE,EXTENDED RELEASE 24 HR: ORAL | 30 days supply | Qty: 30 | Fill #1

## 2021-01-07 MED FILL — TRULICITY 1.5 MG/0.5 ML SUBCUTANEOUS PEN INJECTOR: SUBCUTANEOUS | 28 days supply | Qty: 2 | Fill #1

## 2021-01-09 ENCOUNTER — Ambulatory Visit: Admit: 2021-01-09 | Discharge: 2021-01-10 | Payer: MEDICAID | Attending: Registered" | Primary: Registered"

## 2021-01-09 ENCOUNTER — Ambulatory Visit: Admit: 2021-01-09 | Discharge: 2021-01-10 | Payer: MEDICAID | Attending: "Endocrinology | Primary: "Endocrinology

## 2021-01-09 DIAGNOSIS — E1169 Type 2 diabetes mellitus with other specified complication: Principal | ICD-10-CM

## 2021-01-09 DIAGNOSIS — E113212 Type 2 diabetes mellitus with mild nonproliferative diabetic retinopathy with macular edema, left eye: Principal | ICD-10-CM

## 2021-01-09 DIAGNOSIS — Z794 Long term (current) use of insulin: Principal | ICD-10-CM

## 2021-01-09 DIAGNOSIS — Z8639 Personal history of other endocrine, nutritional and metabolic disease: Principal | ICD-10-CM

## 2021-01-09 DIAGNOSIS — I10 Essential (primary) hypertension: Principal | ICD-10-CM

## 2021-01-09 DIAGNOSIS — H3522 Other non-diabetic proliferative retinopathy, left eye: Principal | ICD-10-CM

## 2021-01-09 DIAGNOSIS — E785 Hyperlipidemia, unspecified: Principal | ICD-10-CM

## 2021-01-09 MED ORDER — DAPAGLIFLOZIN 5 MG TABLET
ORAL_TABLET | Freq: Every morning | ORAL | 1 refills | 90.00000 days | Status: CP
Start: 2021-01-09 — End: 2021-01-15

## 2021-01-11 DIAGNOSIS — Z794 Long term (current) use of insulin: Principal | ICD-10-CM

## 2021-01-11 DIAGNOSIS — E113212 Type 2 diabetes mellitus with mild nonproliferative diabetic retinopathy with macular edema, left eye: Principal | ICD-10-CM

## 2021-01-14 ENCOUNTER — Ambulatory Visit: Admit: 2021-01-14 | Discharge: 2021-01-15 | Payer: MEDICAID

## 2021-01-14 DIAGNOSIS — H04123 Dry eye syndrome of bilateral lacrimal glands: Principal | ICD-10-CM

## 2021-01-14 DIAGNOSIS — E113212 Type 2 diabetes mellitus with mild nonproliferative diabetic retinopathy with macular edema, left eye: Principal | ICD-10-CM

## 2021-01-14 DIAGNOSIS — E113299 Type 2 diabetes mellitus with mild nonproliferative diabetic retinopathy without macular edema, unspecified eye: Principal | ICD-10-CM

## 2021-01-14 DIAGNOSIS — E1136 Type 2 diabetes mellitus with diabetic cataract: Principal | ICD-10-CM

## 2021-01-14 DIAGNOSIS — Z794 Long term (current) use of insulin: Principal | ICD-10-CM

## 2021-01-15 MED ORDER — EMPAGLIFLOZIN 10 MG TABLET
ORAL_TABLET | Freq: Every day | ORAL | 0 refills | 90.00000 days | Status: CP
Start: 2021-01-15 — End: ?
  Filled 2021-01-17: qty 30, 30d supply, fill #0

## 2021-01-25 DIAGNOSIS — L409 Psoriasis, unspecified: Principal | ICD-10-CM

## 2021-02-04 MED FILL — TRULICITY 1.5 MG/0.5 ML SUBCUTANEOUS PEN INJECTOR: SUBCUTANEOUS | 28 days supply | Qty: 2 | Fill #2

## 2021-02-05 ENCOUNTER — Ambulatory Visit: Admit: 2021-02-05 | Discharge: 2021-02-06 | Payer: MEDICAID | Attending: Family | Primary: Family

## 2021-02-05 ENCOUNTER — Ambulatory Visit: Admit: 2021-02-05 | Discharge: 2021-02-06 | Payer: MEDICAID

## 2021-02-05 DIAGNOSIS — M25512 Pain in left shoulder: Principal | ICD-10-CM

## 2021-02-05 DIAGNOSIS — I2510 Atherosclerotic heart disease of native coronary artery without angina pectoris: Secondary | ICD-10-CM | POA: Insufficient documentation

## 2021-02-05 MED ORDER — CANAGLIFLOZIN 300 MG TABLET
ORAL_TABLET | Freq: Every day | ORAL | 3 refills | 90.00000 days | Status: CP
Start: 2021-02-05 — End: ?
  Filled 2021-02-06: qty 30, 30d supply, fill #0

## 2021-02-05 MED FILL — TOLTERODINE ER 4 MG CAPSULE,EXTENDED RELEASE 24 HR: ORAL | 30 days supply | Qty: 30 | Fill #2

## 2021-02-05 NOTE — Unmapped (Signed)
[facility]  Date: 02/05/2021     Primary Care Physician: Olena Leatherwood, NP          ASSESSMENT:    ICD-10-CM   1. Left shoulder pain, unspecified chronicity  M25.512       PLAN:  She likely has rotator cuff tendinitis with possible biceps tendinitis as well.  I recommended physical therapy and recommended better blood sugar control as I do not feel it is appropriate to give her an intra-articular steroid injection.  We will proceed with the following treatment plan:  1. Medications: OTC topical lidocaine patches and previously prescribed Celebrex.  No new prescriptions prescribed today.  2. DME/Cast: None    none    3. PT/OT: none  4. Injections:   None    Scheduling Notes:  Return in about 6 weeks (around 03/19/2021).    - X-rays to be ordered at next visit: none       Requested Prescriptions      No prescriptions requested or ordered in this encounter        Orders Placed This Encounter   Procedures   ??? XR Shoulder 3 Or More Views Left       SUBJECTIVE:  Chief Complaint:  L shoulder pain  History of Present Illness:   Ashley Briggs is a 55 y.o. female who presents for L shoulder pain since fracture 2 years ago when she fell while walking dogs. Occ numbness and heaviness.   Pain Score: Pain Assessment: 0-10  0-10 Pain Scale: 5 (as reported on intake)        ROS:    .       Pertinent positives and negatives are documented in the HPI. All other systems reviewed are negative. Patient was instructed to follow-up with the appropriate provider as necessary for all pertinent positives not related to today's encounter.    Medical History:  Past Medical History:   Diagnosis Date   ??? Anxiety    ??? Cancer (CMS-HCC)     endometrial 2007   ??? Diabetes mellitus (CMS-HCC)    ??? Gastroparesis    ??? Heart disease    ??? High cholesterol    ??? Hypertension    ??? Liver disease    ??? Non-alcoholic fatty liver disease    ??? Psoriasis    ??? Reflux        Surgical History:  Past Surgical History:   Procedure Laterality Date   ??? CHOLECYSTECTOMY     ??? HYSTERECTOMY      endometrial cancer   ??? HYSTERECTOMY     ??? PR ANAL PRESSURE RECORD Left 03/31/2013    Procedure: ANORECTAL MANOMETRY;  Surgeon: None None;  [facility];  Service: Gastroenterology   ??? PR BREATH HYDROGEN TEST N/A 03/31/2013    Procedure: BREATH HYDROGEN TEST;  Surgeon: None None;  Location: [facility];  Service: Gastroenterology   ??? PR COLSC FLX W/RMVL OF TUMOR POLYP LESION SNARE TQ N/A 09/06/2020    Procedure: COLONOSCOPY FLEX; W/REMOV TUMOR/LES BY SNARE;  Surgeon: Chriss Driver, MD;  Location: [facility];  Service: Gastroenterology   ??? PR UPPER GI ENDOSCOPY,BIOPSY N/A 04/15/2013    Procedure: UGI ENDOSCOPY; WITH BIOPSY, SINGLE OR MULTIPLE;  Surgeon: Vickii Chafe, MD;  Location: [facility];  Service: Gastroenterology   ??? PR UPPER GI ENDOSCOPY,BIOPSY N/A 09/06/2020    Procedure: UGI ENDOSCOPY; WITH BIOPSY, SINGLE OR MULTIPLE;  Surgeon: Chriss Driver,  MD;  Location: GI PROCEDURES [facility] [place];  Service: Gastroenterology       Medications:  ??? aspirin (ECOTRIN) 81 MG tablet Take 81 mg by mouth daily.   ??? blood sugar diagnostic (ON CALL EXPRESS TEST STRIP) Strp Use to check blood sugar Three (3) times a day.   ??? blood-glucose meter kit Use as instructed   ??? brexpiprazole (REXULTI) 2 mg Tab Take 2 mg by mouth daily.   ??? canagliflozin (INVOKANA) 300 mg Tab tablet Take 1 tablet (300 mg total) by mouth every morning before breakfast.   ??? celecoxib (CELEBREX) 200 MG capsule Take 1 capsule (200 mg total) by mouth Two (2) times a day. As needed for pain   ??? citalopram (CELEXA) 40 MG tablet Take 40 mg by mouth daily.   ??? clonazePAM (KLONOPIN) 0.5 MG tablet Take 0.5 mg by mouth daily.   ??? dulaglutide 1.5 mg/0.5 mL PnIj Inject 1 mL (3 mg total) under the skin every seven (7) days.   ??? insulin glargine (LANTUS U-100 INSULIN) 100 unit/mL injection Inject 0.66 mL (66 Units total) under the skin nightly.   ??? lancets 30 gauge Misc Use as directed to test once daily.   ??? lisinopriL (PRINIVIL,ZESTRIL) 40 MG tablet Take 1 tablet (40 mg total) by mouth daily.   ??? metFORMIN (GLUCOPHAGE-XR) 500 MG 24 hr tablet Take 2 tablets (1,000 mg total) by mouth in the morning and 2 tablets (1,000 mg total) in the evening. Take with meals.   ??? pantoprazole (PROTONIX) 40 MG tablet Take 1 tablet (40 mg total) by mouth Two (2) times a day.   ??? secukinumab (COSENTYX PEN, 2 PENS,) 150 mg/mL PnIj injection Inject the contents of 2 pens (300 mg) under the skin every 4 weeks .   ??? simvastatin (ZOCOR) 20 MG tablet Take 1 tablet (20 mg total) by mouth every evening.   ??? tolterodine (DETROL LA) 4 MG 24 hr capsule Take 1 capsule (4 mg total) by mouth daily.       Allergies:  Patient has no known allergies.    Social History:  Social History     Socioeconomic History   ??? Marital status: Widowed   Tobacco Use   ??? Smoking status: Never Smoker   ??? Smokeless tobacco: Never Used   Vaping Use   ??? Vaping Use: Never used   Substance and Sexual Activity   ??? Alcohol use: Not Currently     Comment: 2 drinks monthly   ??? Drug use: Never   Other Topics Concern   ??? Do you use sunscreen? No   ??? Tanning bed use? No   ??? Are you easily burned? Yes   ??? Excessive sun exposure? No   ??? Blistering sunburns? Yes   Social History Narrative    ** Merged History Encounter **          Social Determinants of Health     Financial Resource Strain: High Risk   ??? Difficulty of Paying Living Expenses: Hard   Food Insecurity: No Food Insecurity   ??? Worried About Programme researcher, broadcasting/film/video in the Last Year: Never true   ??? Ran Out of Food in the Last Year: Never true   Transportation Needs: No Transportation Needs   ??? Lack of Transportation (Medical): No   ??? Lack of Transportation (Non-Medical): No   Physical Activity: Insufficiently Active   ??? Days of Exercise per Week: 1 day   ??? Minutes of Exercise per Session: 40 min  Family History:  Family History   Problem Relation Age of Onset   ??? Skin cancer Paternal Grandfather    ??? Diabetes Mother    ??? Cancer Mother    ??? Diabetes Maternal Grandmother    ??? Hypertension Maternal Grandmother    ??? Cancer Maternal Grandfather    ??? Diabetes Maternal Grandfather    ??? No Known Problems Father    ??? No Known Problems Sister    ??? No Known Problems Brother    ??? No Known Problems Maternal Aunt    ??? No Known Problems Maternal Uncle    ??? No Known Problems Paternal Aunt    ??? No Known Problems Paternal Uncle    ??? No Known Problems Paternal Grandmother    ??? No Known Problems Other    ??? Melanoma Neg Hx    ??? Basal cell carcinoma Neg Hx    ??? Squamous cell carcinoma Neg Hx    ??? Substance Abuse Disorder Neg Hx    ??? Alcohol abuse Neg Hx    ??? Drug abuse Neg Hx    ??? Mental illness Neg Hx    ??? Amblyopia Neg Hx    ??? Blindness Neg Hx    ??? Cataracts Neg Hx    ??? Glaucoma Neg Hx    ??? Macular degeneration Neg Hx    ??? Retinal detachment Neg Hx    ??? Strabismus Neg Hx    ??? Stroke Neg Hx    ??? Thyroid disease Neg Hx          OBJECTIVE:  DETAILED PHYSICAL EXAM   General Appearance ?? well-nourished, in no acute distress.  Estimated body mass index is 43.43 kg/m?? as calculated from the following:    Height as of 04/10/21: 162.6 cm (5' 4).    Weight as of 04/10/21: 114.8 kg (253 lb).   Mood and Affect ?? alert, cooperative and pleasant.   Gait  ?? smooth and steady   Cardiovascular ?? well-perfused distally and no swelling.   Sensation ?? sensation to light touch distally normal      Left shoulder without obvious deformity.  Mild tenderness palpation about the anterior shoulder diffusely.  Forward elevation to 110 degrees.  External rotation arms at sides to 45 degrees.  Internal rotation to L1 with pain.  Positive Hawkins.  Positive Neer's.  Positive speeds.  Negative Yergason's.  No pain with cross body adduction.  Rotator cuff strength is good throughout pain in supraspinatus testing.    Test Results  Imaging:  X-rays of the left shoulder were obtained today and independently interpreted by myself. These reveal No acute fracture or dislocation. Chronic deformity of the proximal humerus. Tiny ossification along the inferior aspect of the glenohumeral joint, likely secondary to prior injury. The glenohumeral joint is approximated. Mild marginal osteophytosis at the acromioclavicular joint, which is otherwise approximated. Normal visualized lungs.    *This note was created using Scientist, clinical (histocompatibility and immunogenetics). Errors may persist despite proofreading.       Ivor Reining, FNP

## 2021-02-06 ENCOUNTER — Ambulatory Visit: Admit: 2021-02-06 | Discharge: 2021-02-07 | Payer: MEDICAID

## 2021-02-07 ENCOUNTER — Telehealth: Payer: Self-pay | Admitting: Pharmacist

## 2021-02-07 NOTE — Telephone Encounter (Signed)
Patient failed to provide requested 2022 financial documentation. Unable to determine patient's eligibility status. No additional medication assistance will be provided by [facility] without the required proof of income documentation. Patient notified by letter.  [place]

## 2021-02-20 DIAGNOSIS — R1013 Epigastric pain: Principal | ICD-10-CM

## 2021-02-21 MED ORDER — PANTOPRAZOLE 40 MG TABLET,DELAYED RELEASE
ORAL_TABLET | Freq: Two times a day (BID) | ORAL | 1 refills | 45 days | Status: CP
Start: 2021-02-21 — End: ?
  Filled 2021-02-21: qty 90, 45d supply, fill #0

## 2021-02-21 MED ORDER — TOLTERODINE ER 4 MG CAPSULE,EXTENDED RELEASE 24 HR
ORAL_CAPSULE | Freq: Every day | ORAL | 2 refills | 90 days | Status: CP
Start: 2021-02-21 — End: 2022-02-21
  Filled 2021-02-22: qty 90, 90d supply, fill #0

## 2021-02-22 DIAGNOSIS — N3281 Overactive bladder: Principal | ICD-10-CM

## 2021-02-22 MED FILL — METFORMIN ER 500 MG TABLET,EXTENDED RELEASE 24 HR: ORAL | 45 days supply | Qty: 180 | Fill #2

## 2021-02-22 MED FILL — SIMVASTATIN 20 MG TABLET: ORAL | 90 days supply | Qty: 90 | Fill #1

## 2021-02-22 MED FILL — LISINOPRIL 40 MG TABLET: ORAL | 90 days supply | Qty: 90 | Fill #1

## 2021-03-06 ENCOUNTER — Other Ambulatory Visit: Payer: Self-pay

## 2021-03-06 MED ORDER — REXULTI 2 MG PO TABS
ORAL_TABLET | ORAL | 2 refills | Status: DC
  Filled 2021-03-06: qty 30, 30d supply, fill #0
  Filled 2021-05-30: qty 30, 30d supply, fill #1
  Filled ????-??-??: fill #1

## 2021-03-06 MED ORDER — CITALOPRAM HYDROBROMIDE 40 MG PO TABS
ORAL_TABLET | ORAL | 2 refills | Status: DC
  Filled 2021-03-06: qty 30, 30d supply, fill #0
  Filled 2021-05-28 – 2021-05-30 (×2): qty 30, 30d supply, fill #1

## 2021-03-07 ENCOUNTER — Other Ambulatory Visit: Payer: Self-pay

## 2021-03-08 DIAGNOSIS — E113392 Type 2 diabetes mellitus with moderate nonproliferative diabetic retinopathy without macular edema, left eye: Principal | ICD-10-CM

## 2021-03-08 DIAGNOSIS — Z794 Long term (current) use of insulin: Principal | ICD-10-CM

## 2021-03-11 MED FILL — TRULICITY 1.5 MG/0.5 ML SUBCUTANEOUS PEN INJECTOR: SUBCUTANEOUS | 28 days supply | Qty: 2 | Fill #3

## 2021-03-12 MED FILL — INVOKANA 300 MG TABLET: ORAL | 30 days supply | Qty: 30 | Fill #1

## 2021-03-13 ENCOUNTER — Ambulatory Visit: Admit: 2021-03-13 | Discharge: 2021-03-14 | Payer: MEDICAID | Attending: Family | Primary: Family

## 2021-03-13 DIAGNOSIS — M25562 Pain in left knee: Principal | ICD-10-CM

## 2021-03-13 DIAGNOSIS — R748 Abnormal levels of other serum enzymes: Principal | ICD-10-CM

## 2021-03-13 DIAGNOSIS — K760 Fatty (change of) liver, not elsewhere classified: Principal | ICD-10-CM

## 2021-03-13 MED ORDER — CELECOXIB 200 MG CAPSULE
ORAL_CAPSULE | Freq: Two times a day (BID) | ORAL | 3 refills | 30 days | Status: CP
Start: 2021-03-13 — End: 2021-04-12
  Filled 2021-03-14: qty 60, 30d supply, fill #0

## 2021-03-15 ENCOUNTER — Other Ambulatory Visit: Payer: Self-pay

## 2021-03-19 ENCOUNTER — Ambulatory Visit: Admit: 2021-03-19 | Discharge: 2021-03-20 | Payer: MEDICAID

## 2021-03-19 ENCOUNTER — Ambulatory Visit: Admit: 2021-03-19 | Discharge: 2021-03-20 | Payer: MEDICAID | Attending: Family | Primary: Family

## 2021-03-19 NOTE — Unmapped (Unsigned)
[facility]  Date: 03/19/2021     Primary Care Physician: Olena Leatherwood, NP          ASSESSMENT:    ICD-10-CM   1. Chronic left shoulder pain  M25.512    G89.29   2. Chronic pain of left knee  M25.562    G89.29       PLAN:  ***  We will proceed with the following treatment plan:  1. Medications: OTC analgesia as needed  2. DME/Cast: None        3. PT/OT: Internal Referral Placed  4. Injections:   {JAM Injections:81295}    Scheduling Notes:  Return in about 6 weeks (around 04/30/2021).    - X-rays to be ordered at next visit: ***     Requested Prescriptions      No prescriptions requested or ordered in this encounter        Orders Placed This Encounter   Procedures   ??? XR Knee 4 Or More Views Left   ??? Ambulatory referral to Physical Therapy       SUBJECTIVE:  Chief Complaint:  L knee and shoulder pain  History of Present Illness:   Ashley Briggs is a 55 y.o. female who presents for  L knee and shoulder pain  Pain Score: Pain Assessment: 0-10  0-10 Pain Scale: 5  Pain Location: Shoulder (LEFT KNEE - 9/10)  Pain Orientation: Left (as reported on intake)        ROS:    .       Pertinent positives and negatives are documented in the HPI. All other systems reviewed are negative. Patient was instructed to follow-up with the appropriate provider as necessary for all pertinent positives not related to today's encounter.    Medical History:  Past Medical History:   Diagnosis Date   ??? Anxiety    ??? Blepharitis    ??? Cancer (CMS-HCC)     endometrial 2007   ??? Diabetes mellitus (CMS-HCC)    ??? Diabetic retinopathy (CMS-HCC)     Mild NPDR w/ME, left eye   ??? Gastroparesis    ??? Heart disease    ??? High cholesterol    ??? Hypertension    ??? Liver disease    ??? Non-alcoholic fatty liver disease    ??? 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 Psoriasis    ??? Ptosis of eyelid, left     Left upper eyelid   ??? Reflux        Surgical History:  Past Surgical History:   Procedure Laterality Date   ??? CHOLECYSTECTOMY     ??? HYSTERECTOMY      endometrial cancer   ??? HYSTERECTOMY     ??? PR ANAL PRESSURE RECORD Left 03/31/2013    Procedure: ANORECTAL MANOMETRY;  Surgeon: None None;  [facility]: [facility];  Service: Gastroenterology   ??? PR BREATH HYDROGEN TEST N/A 03/31/2013    Procedure: BREATH HYDROGEN TEST;  Surgeon: None None;  [facility]: [facility];  Service: Gastroenterology   ??? PR COLSC FLX W/RMVL OF TUMOR POLYP LESION SNARE TQ N/A 09/06/2020    Procedure: COLONOSCOPY FLEX; W/REMOV TUMOR/LES BY SNARE;  Surgeon: Chriss Driver, MD;  Location: [facility];  Service: Gastroenterology   ??? PR UPPER GI ENDOSCOPY,BIOPSY N/A 04/15/2013    Procedure: UGI ENDOSCOPY; WITH BIOPSY, SINGLE OR MULTIPLE;  Surgeon: Vickii Chafe, MD;  [facility]: [facility];  Service: Gastroenterology   ??? PR UPPER GI ENDOSCOPY,BIOPSY N/A 09/06/2020    Procedure: UGI ENDOSCOPY; WITH BIOPSY, SINGLE OR MULTIPLE;  Surgeon: Chriss Driver, MD;  Location: [facility];  Service: Gastroenterology       Medications:  ??? aspirin (ECOTRIN) 81 MG tablet Take 81 mg by mouth daily.   ??? blood sugar diagnostic (ON CALL EXPRESS TEST STRIP) Strp Use to check blood sugar Three (3) times a day.   ??? brexpiprazole (REXULTI) 2 mg Tab Take 2 mg by mouth daily.   ??? canagliflozin (INVOKANA) 300 mg Tab tablet Take 1 tablet (300 mg total) by mouth every morning before breakfast.   ??? celecoxib (CELEBREX) 200 MG capsule Take 1 capsule (200 mg total) by mouth Two (2) times a day. As needed for pain   ??? citalopram (CELEXA) 40 MG tablet Take 40 mg by mouth daily.   ??? clonazePAM (KLONOPIN) 0.5 MG tablet Take 0.5 mg by mouth daily.   ??? lancets 30 gauge Misc Use as directed to test once daily.   ??? tolterodine (DETROL LA) 4 MG 24 hr capsule Take 1 capsule (4 mg total) by mouth daily.   ??? amitriptyline (ELAVIL) 10 MG tablet TAKE ONE TABLET BY MOUTH ONCE DAILY AT BEDTIME.   ??? blood-glucose meter Misc Use as directed by provider.   ??? cephalexin (KEFLEX) 500 MG capsule TAKE 1 CAPSULE BY MOUTH 4 TIMES DAILY FOR 5 DAYS   ??? dulaglutide 1.5 mg/0.5 mL PnIj Inject the contents of 2 syringes (3 mg total) under the skin every seven (7) days.   ??? insulin glargine (LANTUS U-100 INSULIN) 100 unit/mL injection Inject 0.66 mL (66 Units total) under the skin nightly.   ??? lisinopriL (PRINIVIL,ZESTRIL) 40 MG tablet Take 1 tablet (40 mg total) by mouth in the morning.   ??? metFORMIN (GLUCOPHAGE-XR) 500 MG 24 hr tablet Take 2 tablets (1,000 mg total) by mouth in the morning and 2 tablets (1,000 mg total) in the evening. Take with meals.   ??? pantoprazole (PROTONIX) 40 MG tablet Take 1 tablet (40 mg total) by mouth Two (2) times a day.   ??? promethazine (PHENERGAN) 25 MG tablet Take 1 tablet (25 mg total) by mouth at bedtime for 14 days. May repeat during day as needed. Medication for nausea.   ??? secukinumab (COSENTYX PEN, 2 PENS,) 150 mg/mL PnIj injection Inject the contents of 2 pens (300 mg) under the skin every 4 weeks .   ??? simvastatin (ZOCOR) 20 MG tablet Take 1 tablet (20 mg total) by mouth every evening.       Allergies:  Patient has no known allergies.    Social History:  Social History     Socioeconomic History   ??? Marital status: Widowed   Tobacco Use   ??? Smoking status: Never Smoker   ??? Smokeless tobacco: Never Used   Vaping Use   ??? Vaping Use: Never used   Substance and Sexual Activity   ??? Alcohol use: Not Currently     Comment: 2 drinks monthly   ??? Drug use: Never   Other Topics Concern   ??? Do you use sunscreen? No   ??? Tanning bed use? No   ??? Are you easily burned? Yes   ??? Excessive sun exposure? No   ??? Blistering sunburns? Yes   Social History Narrative    ** Merged History Encounter **          Social Determinants of Health  Financial Resource Strain: High Risk   ??? Difficulty of Paying Living Expenses: Hard   Food Insecurity: No Food Insecurity   ??? Worried About Programme researcher, broadcasting/film/video in the Last Year: Never true   ??? Ran Out of Food in the Last Year: Never true   Transportation Needs: No Transportation Needs   ??? Lack of Transportation (Medical): No   ??? Lack of Transportation (Non-Medical): No   Physical Activity: Insufficiently Active   ??? Days of Exercise per Week: 1 day   ??? Minutes of Exercise per Session: 40 min       Family History:  Family History   Problem Relation Age of Onset   ??? Skin cancer Paternal Grandfather    ??? Diabetes Mother    ??? Cancer Mother    ??? Diabetes Maternal Grandmother    ??? Hypertension Maternal Grandmother    ??? Cancer Maternal Grandfather    ??? Diabetes Maternal Grandfather    ??? No Known Problems Father    ??? No Known Problems Sister    ??? No Known Problems Brother    ??? No Known Problems Maternal Aunt    ??? No Known Problems Maternal Uncle    ??? No Known Problems Paternal Aunt    ??? No Known Problems Paternal Uncle    ??? No Known Problems Paternal Grandmother    ??? No Known Problems Other    ??? Melanoma Neg Hx    ??? Basal cell carcinoma Neg Hx    ??? Squamous cell carcinoma Neg Hx    ??? Substance Abuse Disorder Neg Hx    ??? Alcohol abuse Neg Hx    ??? Drug abuse Neg Hx    ??? Mental illness Neg Hx    ??? Amblyopia Neg Hx    ??? Blindness Neg Hx    ??? Cataracts Neg Hx    ??? Glaucoma Neg Hx    ??? Macular degeneration Neg Hx    ??? Retinal detachment Neg Hx    ??? Strabismus Neg Hx    ??? Stroke Neg Hx    ??? Thyroid disease Neg Hx          OBJECTIVE:  DETAILED PHYSICAL EXAM   General Appearance ?? well-nourished, in no acute distress.  Estimated body mass index is 45.49 kg/m?? as calculated from the following:    Height as of 05/17/21: 162.6 cm (5' 4).    Weight as of 05/17/21: 120.2 kg (265 lb).   Mood and Affect ?? alert, cooperative and pleasant.   Gait  ?? {JAM Gait:81294::smooth and steady}   Cardiovascular ?? well-perfused distally and no swelling.   Sensation ?? sensation to light touch distally {Desc; normal/diminished:12763}      {JAM Musculoskeletal Exam List:81297}    Test Results  Imaging:  X-rays of the *** were obtained today and independently interpreted by myself. These reveal ***    *This note was created using Scientist, clinical (histocompatibility and immunogenetics). Errors may persist despite proofreading.       Ivor Reining, FNP

## 2021-03-20 DIAGNOSIS — E1169 Type 2 diabetes mellitus with other specified complication: Principal | ICD-10-CM

## 2021-03-20 DIAGNOSIS — Z794 Long term (current) use of insulin: Principal | ICD-10-CM

## 2021-03-20 MED ORDER — LANTUS U-100 INSULIN 100 UNIT/ML SUBCUTANEOUS SOLUTION
Freq: Every evening | SUBCUTANEOUS | 4 refills | 15 days | Status: CP
Start: 2021-03-20 — End: 2022-03-18
  Filled 2021-04-01: qty 10, 15d supply, fill #0

## 2021-03-27 DIAGNOSIS — M25562 Pain in left knee: Principal | ICD-10-CM

## 2021-03-27 DIAGNOSIS — G8929 Other chronic pain: Principal | ICD-10-CM

## 2021-03-28 NOTE — Unmapped (Signed)
Endocrinology Follow-up    Ashley Briggs is a very pleasant 55 y.o. female seen in Endocrinology consultation follow-up on 04/10/21 for uncontrolled DM2 diagnosed 1999 with c/b retinopathy, neuropathy, and gastroparesis.     Patient continues invokana 300mg  daily, dulaglutide 1.5mg  q7 days, lantus 66 units nightly, and metformin 1000mg  BID. Reports no worsened nausea or emesis with addition of dulaglutide. Denies hypoglycemia . Reports she checks blood sugar at different times every day -- fasting, pre-and-post-prandial. Reports yesterday's fasting blood sugar was 148. Reports post-prandial blood sugars are typically above 200. Did not bring a blood sugar log to appointment. Blood glucose at appointment is 224, patient reports having a banana and diet Dr. Reino Kent.     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  Patient will continue good management of diabetes especially with weight loss. Hopefully with further weight loss will run into the problem of being overmedicated and having to cut back on medications    Some confusion on diabetes medications was have not.been refilled for over a year.  Patient still getting prescriptions will not adjust these medicines    Assessment & Plan:    Lab Results   Component Value Date    A1C 11.3 (A) 11/27/2020    A1C 10.9 (A) 08/22/2020    A1C 12.2 (H) 05/14/2020      Wt Readings from Last 3 Encounters:   04/10/21 (!) 114.8 kg (253 lb)   04/10/21 (!) 114.8 kg (253 lb)   03/13/21 (!) 121.6 kg (268 lb)     Last diabetic foot exam: 08/22/2020  Last diabetic retinal exam: 01/14/2021    Plan:  - Increase dulaglutide to 3mg  q7 days  - Continue invokana 300mg  daily, lantus 66 units nightly, and metformin 1000mg  BID  - Commended dietary changes and encouraged continuing these  - Follow up 3 months    Orders:  -     POCT glucose  -     POCT glycosylated hemoglobin (Hb A1C)  -     dulaglutide; Inject 1 mL (3 mg total) under the skin every seven (7) days.      Return in about 3 months (around 07/11/2021) for Follow up, DM, diabetes, Next scheduled follow up.    Patient Instructions   Please increase your dulaglutide to 3mg  every 7 days. You can take your other medicines as you have been.         Subjective:     History of Present Illness: See problem oriented charting.    Past Medical History:   Diagnosis Date   ??? Anxiety    ??? Cancer (CMS-HCC)     endometrial 2007   ??? Diabetes mellitus (CMS-HCC)    ??? Gastroparesis    ??? Heart disease    ??? High cholesterol    ??? Hypertension    ??? Liver disease    ??? Non-alcoholic fatty liver disease    ??? Psoriasis    ??? Reflux         Past Surgical History:   Procedure Laterality Date   ??? CHOLECYSTECTOMY     ??? HYSTERECTOMY      endometrial cancer   ???  HYSTERECTOMY     ??? PR ANAL PRESSURE RECORD Left 03/31/2013    Procedure: ANORECTAL MANOMETRY;  Surgeon: None None;  [facility];  Service: Gastroenterology   ??? PR BREATH HYDROGEN TEST N/A 03/31/2013    Procedure: BREATH HYDROGEN TEST;  Surgeon: None None;  Location: [facility];  Service: Gastroenterology   ??? PR COLSC FLX W/RMVL OF TUMOR POLYP LESION SNARE TQ N/A 09/06/2020    Procedure: COLONOSCOPY FLEX; W/REMOV TUMOR/LES BY SNARE;  Surgeon: Chriss Driver, MD;  Location: [facility];  Service: Gastroenterology   ??? PR UPPER GI ENDOSCOPY,BIOPSY N/A 04/15/2013    Procedure: UGI ENDOSCOPY; WITH BIOPSY, SINGLE OR MULTIPLE;  Surgeon: Vickii Chafe, MD;  [facility]: [facility];  Service: Gastroenterology   ??? PR UPPER GI ENDOSCOPY,BIOPSY N/A 09/06/2020    Procedure: UGI ENDOSCOPY; WITH BIOPSY, SINGLE OR MULTIPLE;  Surgeon: Chriss Driver, MD;  Location: [facility];  Service: Gastroenterology       Current Outpatient Medications   Medication Sig Dispense Refill   ??? aspirin (ECOTRIN) 81 MG tablet Take 81 mg by mouth daily.     ??? brexpiprazole (REXULTI) 2 mg Tab Take 2 mg by mouth daily.     ??? canagliflozin (INVOKANA) 300 mg Tab tablet Take 1 tablet (300 mg total) by mouth every morning before breakfast. 90 tablet 3   ??? celecoxib (CELEBREX) 200 MG capsule Take 1 capsule (200 mg total) by mouth Two (2) times a day. As needed for pain 60 capsule 3   ??? citalopram (CELEXA) 40 MG tablet Take 40 mg by mouth daily.     ??? clonazePAM (KLONOPIN) 0.5 MG tablet Take 0.5 mg by mouth daily.     ??? dulaglutide 1.5 mg/0.5 mL PnIj Inject 1 mL (3 mg total) under the skin every seven (7) days. 4 mL 6   ??? insulin glargine (LANTUS U-100 INSULIN) 100 unit/mL injection Inject 0.66 mL (66 Units total) under the skin nightly. 10 mL 4   ??? lisinopriL (PRINIVIL,ZESTRIL) 40 MG tablet Take 1 tablet (40 mg total) by mouth daily. 90 tablet 1   ??? metFORMIN (GLUCOPHAGE-XR) 500 MG 24 hr tablet Take 2 tablets (1,000 mg total) by mouth 2 (two) times a day with meals. 180 tablet 3   ??? pantoprazole (PROTONIX) 40 MG tablet Take 1 tablet (40 mg total) by mouth Two (2) times a day. 90 tablet 1   ??? secukinumab (COSENTYX PEN, 2 PENS,) 150 mg/mL PnIj injection Inject the contents of 2 pens (300 mg) under the skin every 4 weeks . 2 mL 12   ??? simvastatin (ZOCOR) 20 MG tablet Take 1 tablet (20 mg total) by mouth every evening. 90 tablet 1   ??? tolterodine (DETROL LA) 4 MG 24 hr capsule Take 1 capsule (4 mg total) by mouth daily. 90 capsule 2   ??? blood sugar diagnostic (ON CALL EXPRESS TEST STRIP) Strp Use to check blood sugar Three (3) times a day. 300 each 1   ??? blood-glucose meter kit Use as instructed 1 each 0   ??? lancets 30 gauge Misc Use as directed to test once daily. 100 each 3     No current facility-administered medications for this visit.        No Known Allergies    Family History   Problem Relation Age of Onset   ??? Skin cancer Paternal Grandfather    ??? Diabetes Mother    ??? Cancer  Mother    ??? Diabetes Maternal Grandmother    ??? Hypertension Maternal Grandmother    ??? Cancer Maternal Grandfather    ??? Diabetes Maternal Grandfather    ??? No Known Problems Father    ??? No Known Problems Sister    ??? No Known Problems Brother    ??? No Known Problems Maternal Aunt    ??? No Known Problems Maternal Uncle    ??? No Known Problems Paternal Aunt    ??? No Known Problems Paternal Uncle    ??? No Known Problems Paternal Grandmother    ??? No Known Problems Other    ??? Melanoma Neg Hx    ??? Basal cell carcinoma Neg Hx    ??? Squamous cell carcinoma Neg Hx    ??? Substance Abuse Disorder Neg Hx    ??? Alcohol abuse Neg Hx    ??? Drug abuse Neg Hx    ??? Mental illness Neg Hx    ??? Amblyopia Neg Hx    ??? Blindness Neg Hx    ??? Cataracts Neg Hx    ??? Glaucoma Neg Hx    ??? Macular degeneration Neg Hx    ??? Retinal detachment Neg Hx    ??? Strabismus Neg Hx    ??? Stroke Neg Hx    ??? Thyroid disease Neg Hx            Review of Systems -  negative except as noted in HPI.     Objective:     Physical Exam:  BP 114/66  - Pulse 82  - Resp 14  - Wt (!) 114.8 kg (253 lb)  - LMP 08/03/2006  - SpO2 98%  - BMI 43.43 kg/m??   General appearance - alert, well appearing, and in no distress.  Neurological - no hand tremors. Normal gait.  Psych - Normal mood, appropriate affect.      Lab Review:  I've reviewed the patient's most recent pertinent labs in the electronic record and provided records.  No results found for: TSH, T3TOTAL, T4TOTAL, FREET4, TPERXAB, THYROG2NDGEN, THYAC      No results found for: TSH, FREET4, T4TOTAL, FREET3, T3TOTAL, THYROG2NDGEN, THYIAINT, LABTHYR, THYROIDAB, THYROGLB    Lab Results   Component Value Date    CALCIUM 9.8 03/13/2021    CALCIUM 9.8 08/03/2020    CALCIUM 9.2 05/14/2020    ALBUMIN 3.3 (L) 03/13/2021    CREATININE 0.73 03/13/2021       Lab Results   Component Value Date    A1C 8.9 (A) 04/10/2021 GLU 267 (H) 03/13/2021    CREATININE 0.73 03/13/2021    ALBCRERAT 4.9 11/27/2020    ALBQTUR 0.6 11/27/2020          Radiology:    Thyroid US:  No results found for this or any previous visit.            DEXA:    No results found for this or any previous visit.             Other Medical Data :      I've personally reviewed and summarized records in EPIC/Media and via CareEverywhere as well as medication, allergies, past medical, social, and family history.     I personally spent face-to-face and non-face-to-face in the care of this patient, which includes all pre, intra, and post visit time on the date of service.    I attest that I, Grayland Jack, personally documented this note while acting as scribe for  Marvia Pickles, MD.      Grayland Jack, Scribe.  04/10/2021     The documentation recorded by the scribe accurately reflects the service I personally performed and the decisions made by me.    Marvia Pickles, MD

## 2021-03-29 MED FILL — PANTOPRAZOLE 40 MG TABLET,DELAYED RELEASE: ORAL | 45 days supply | Qty: 90 | Fill #1

## 2021-04-05 NOTE — Unmapped (Signed)
Nutrition Collaborative Consult Note      Present height 162.6 cm (5' 4)  Present weight (!) 114.8 kg (253 lb)    For Adult Patients:  Current BMI (!) 43.41    Weight History:  Wt Readings from Last 3 Encounters:   04/10/21 (!) 114.8 kg (253 lb)   04/10/21 (!) 114.8 kg (253 lb)   03/13/21 (!) 121.6 kg (268 lb)       Blood Glucose Readings   Fasting Lunch Pre-Supper Bedtime   Range (mg/dL) 161       Average (mg/dL)       Hypoglycemia: no    Physical Activity:  Walking     24-Hour recall/usual intake:    Time Intake   Breakfast Banana and Tomato    Snack (AM)    Lunch Big Mac   Shared a small fry    Snack (PM)    Dinner Science writer and Tomato    Snack (HS)      Other Nutrition Information:  HISTORY Reduced carbohydrate portions  Notes that she feels better  - blood sugars coming down       MEAL PATTERN 3 meals     SNACKING Switched to popcorn fruit  from chips   Oranges and Strawberries   Watermelon     GROCERY SHOPPING With Cousin who helps her     COOKING Taking turns cooking     DINING OUT 1x per week  Rarely - not in the budget     BEVERAGES Diet Dr Reino Kent and Water   - wants to focus on water       Patient arrives for assistance with weight loss.  Reviewed dietary recall and physical activity.  Set goals for next appointment. Notes that she has been reducing carbohydrate portions.  Reports she is eating 3 meals now instead of 1-2.  Discussed plan to maintain goals and continue to focus on vegetables.      Estimated Daily Needs:  1934 calories (Mifflin-St Jeor equation for BMI > 25 using actual body weight, adjusted for weight loss)  91-114 g protein (0.8-1 g/kg)     Nutrition Intervention:  Nutrition Counseling    Materials Provided:  N/A    Social Determinants of Health interventions provided: N/A - no concerns identified.     Patient Stated Nutrition Goals:  1) Have fruit in the evening - nightly               - put it in a bowl               - apples bananas   --going well     Focus Goal   ??  2) Make half the plate vegetable.               - at dinner- nightly               - use frozen or canned   -- still struggling with this     Goals Added to Visit Navigator?  Yes    Follow-up:  3 months, same day as primary care visit    Length of visit was 15 minutes  0 unit(s) billed per insurance    Rita Ohara MS RD CDE

## 2021-04-07 NOTE — Unmapped (Signed)
Dermatology Note     Assessment and Plan:      Plaque psoriasis- flaring not at goal (was significantly improved per patient while on Cosentyx)  - Restart secukinumab (COSENTYX PEN, 2 PENS,) 150 mg/mL PnIj injection; Inject two pens (300 mg) every 4 weeks.   - Discussed Cosentyx will likely improve her arthralgias but if this does not, can consider referral to Rheumatology for further evaluation.  -She previously tolerated Cosentyx well.    High risk medication use (Cosentyx)  - CBC, Creatinine, BUN, AST, and ALT from 03/2021 were stable compared to prior labs.  - Negative PPD test in 10/2019. Ordered repeat Quant gold today.    The patient was advised to call for an appointment should any new, changing, or symptomatic lesions develop.     RTC: Return in about 6 months (around 10/10/2021). or sooner as needed   _________________________________________________________________      Chief Complaint     Chief Complaint   Patient presents with   ??? Psoriasis     needs cosentyx refill.    ??? Lesion Of Concern     under breast has raw places including ABD x1 month        HPI     Ashley Briggs is a 55 y.o. female who presents as a returning patient (last seen by Dr. Linton Rump on 09/21/2019) to [facility] for follow up of psoriasis. At last visit, she was started on Cosentyx 300 mg every 4 weeks.    Today, patient reports that she is itching on the back and buttock. Her scalp is quiet today. She also has a new activity on the abdomen and under the breasts. She has not been on Cosentyx for 4 months since she was unable to obtain refills (has not been seen in our clinic in over a year) but found the medication effective. She follows with primary care for her diabetes and reports A1C was 11.4 around six months ago. She endorses joint pain in the knees that had improved with Cosentyx in the past.    The patient denies any other new or changing lesions or areas of concern.     Pertinent Past Medical History     History of endometrial cancer  Diabetes type 2 on insulin  Hypertension  Hyperlipidemia  Coronary artery disease  Non-alcoholic fatty liver disease  Hepatic steatosis    Family History:   Negative for melanoma    Past Medical History, Family History, Social History, Medication List, Allergies, and Problem List were reviewed in the rooming section of Epic.     ROS: Other than symptoms mentioned in the HPI, no fevers, chills, or other skin complaints    Physical Examination     GENERAL: Well-appearing female in no acute distress, resting comfortably.  NEURO: Alert and oriented, answers questions appropriately  PSYCH: Normal mood and affect  SKIN (Focal Skin Exam): Per patient request, examination of trunk, upper extremities, hands, and fingernails was performed  - Nails normal  - Psoriatic patches and plaques under breasts, under abdomen, and back  - Broad hyperpigmentation where psoriasis used to be    All areas not commented on are within normal limits or unremarkable    Scribe's Attestation: Abbie Sons, MD obtained and performed the history, physical exam and medical decision making elements that were entered into the chart.  Signed by Minette Brine, Scribe, on Apr 09, 2021 11:02 AM.    Apr 09, 2021 9:41 PM. Documentation assistance provided by the  Scribe. I was present during the time the encounter was recorded. The information recorded by the Scribe was done at my direction and has been reviewed and validated by me.        (Approved Template 08/13/2020)

## 2021-04-08 ENCOUNTER — Other Ambulatory Visit: Payer: Self-pay

## 2021-04-08 ENCOUNTER — Ambulatory Visit: Admit: 2021-04-08 | Discharge: 2021-04-09 | Payer: MEDICAID

## 2021-04-08 DIAGNOSIS — H3500 Unspecified background retinopathy: Principal | ICD-10-CM

## 2021-04-08 MED FILL — TRULICITY 1.5 MG/0.5 ML SUBCUTANEOUS PEN INJECTOR: SUBCUTANEOUS | 28 days supply | Qty: 2 | Fill #4

## 2021-04-08 NOTE — Unmapped (Signed)
[facility]    Initial History of present illness:   55 year old female referred from Dr. Julaine Hua for evaluation of diabetic retinopathy.  At her visit 12/11/2020 patient was noted to have macular edema in the left eye more than the right.    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       Date Medication Interval (w) Comments   01/14/2021   Avastin     04/08/2021  12              Follow up 4 weeks OCT, Avastin Left, no dilation      # Blepharitis both eyes   Followed by Dr. Julaine Hua  Continue lubrication and lid hygiene    # Ptosis Left Upper Lid  Patient has droopy LUL that isn't bothering her vision.  Observe

## 2021-04-09 ENCOUNTER — Other Ambulatory Visit: Admit: 2021-04-09 | Discharge: 2021-04-09 | Payer: MEDICAID

## 2021-04-09 ENCOUNTER — Ambulatory Visit: Admit: 2021-04-09 | Discharge: 2021-04-09 | Payer: MEDICAID

## 2021-04-09 DIAGNOSIS — L409 Psoriasis, unspecified: Principal | ICD-10-CM

## 2021-04-09 DIAGNOSIS — Z79899 Other long term (current) drug therapy: Principal | ICD-10-CM

## 2021-04-09 MED ORDER — COSENTYX PEN 300 MG/2 PENS (150 MG/ML) SUBCUTANEOUS
SUBCUTANEOUS | 12 refills | 0.00000 days | Status: CP
Start: 2021-04-09 — End: ?
  Filled 2021-04-11: qty 2, 28d supply, fill #0

## 2021-04-10 ENCOUNTER — Ambulatory Visit: Admit: 2021-04-10 | Discharge: 2021-04-10 | Payer: MEDICAID | Attending: "Endocrinology | Primary: "Endocrinology

## 2021-04-10 ENCOUNTER — Ambulatory Visit: Admit: 2021-04-10 | Discharge: 2021-04-10 | Payer: MEDICAID | Attending: Registered" | Primary: Registered"

## 2021-04-10 DIAGNOSIS — Z794 Long term (current) use of insulin: Principal | ICD-10-CM

## 2021-04-10 DIAGNOSIS — E669 Obesity, unspecified: Principal | ICD-10-CM

## 2021-04-10 DIAGNOSIS — E113212 Type 2 diabetes mellitus with mild nonproliferative diabetic retinopathy with macular edema, left eye: Principal | ICD-10-CM

## 2021-04-10 LAB — TB AG2: TB AG2 VALUE: 0.02

## 2021-04-10 LAB — TB AG1: TB AG1 VALUE: 0.02

## 2021-04-10 LAB — QUANTIFERON TB GOLD PLUS
QUANTIFERON ANTIGEN 1 MINUS NIL: 0 [IU]/mL
QUANTIFERON ANTIGEN 2 MINUS NIL: 0 [IU]/mL
QUANTIFERON MITOGEN: 9.98 [IU]/mL
QUANTIFERON TB GOLD PLUS: NEGATIVE
QUANTIFERON TB NIL VALUE: 0.02 [IU]/mL

## 2021-04-10 LAB — TB NIL: TB NIL VALUE: 0.02

## 2021-04-10 LAB — TB MITOGEN: TB MITOGEN VALUE: 10

## 2021-04-10 MED ORDER — DULAGLUTIDE 1.5 MG/0.5 ML SUBCUTANEOUS PEN INJECTOR
SUBCUTANEOUS | 6 refills | 28.00000 days | Status: CP
Start: 2021-04-10 — End: ?
  Filled 2021-05-13: qty 4, 28d supply, fill #0

## 2021-04-10 NOTE — Unmapped (Signed)
Lab Results   Component Value Date    A1C 11.3 (A) 11/27/2020    A1C 10.9 (A) 08/22/2020    A1C 12.2 (H) 05/14/2020      Wt Readings from Last 3 Encounters:   04/10/21 (!) 114.8 kg (253 lb)   04/10/21 (!) 114.8 kg (253 lb)   03/13/21 (!) 121.6 kg (268 lb)     Last diabetic foot exam: 08/22/2020  Last diabetic retinal exam: 01/14/2021    Plan:  - Increase dulaglutide to 3mg  q7 days  - Continue invokana 300mg  daily, lantus 66 units nightly, and metformin 1000mg  BID  - Commended dietary changes and encouraged continuing these  - Follow up 3 months

## 2021-04-10 NOTE — Unmapped (Signed)
[facility]   Patient Onboarding/Medication Counseling    Ashley Briggs is a 55 y.o. female with psoriasis who I am counseling today on continuation of therapy.  I am speaking to the patient.    Was a Nurse, learning disability used for this call? No    Verified patient's date of birth / HIPAA.    Specialty medication(s) to be sent: Inflammatory Disorders: Cosentyx      Non-specialty medications/supplies to be sent: na      Medications not needed at this time: na         Cosentyx (secukinumab)    Medication & Administration     Dosage: maintenance dosing for psoriasis: inject 2 pens (300mg ) under the skin every 4 weeks      Lab tests required prior to treatment initiation:  ??? Tuberculosis: Tuberculosis screening resulted in a non-reactive TB skin test.      Administration:     Prefilled Sensoready?? auto-injector pen  1. Gather all supplies needed for injection on a clean, flat working surface: medication pen removed from packaging, alcohol swab, sharps container, etc.  2. Look at the medication label ??? look for correct medication, correct dose, and check the expiration date  3. Look at the medication ??? the liquid visible in the window on the side of the pen device should appear clear and colorless to slightly yellow  4. Lay the auto-injector pen on a flat surface and allow it to warm up to room temperature for at least 15-30 minutes  5. Select injection site ??? you can use the front of your thigh or your belly (but not the area 2 inches around your belly button); if someone else is giving you the injection you can also use your upper arm in the skin covering your triceps muscle  6. Prepare injection site ??? wash your hands and clean the skin at the injection site with an alcohol swab and let it air dry, do not touch the injection site again before the injection  7. Twist off the purple safety cap in the direction of the arrow, do not remove until immediately prior to injection and do not touch the yellow needle cover  8. Put the white needle cover against your skin at the injection site at a 90 degree angle, hold the pen such that you can see the clear medication window  9. Press down and hold the pen firmly against your skin, there will be a click when the injection starts  10. Continue to hold the pen firmly against your skin for about 10-15 seconds ??? the window will start to turn solid green  11. There will be a second click sound when the injection is almost complete, verify the window is solid green to indicate the injection is complete and then pull the pen away from your skin  12. Dispose of the used auto-injector pen immediately in your sharps disposal container the needle will be covered automatically  13. If you see any blood at the injection site, press a cotton ball or gauze on the site and maintain pressure until the bleeding stops, do not rub the injection site      Adherence/Missed dose instructions:  If your injection is given more than 4 days after your scheduled injection date ??? consult your pharmacist for additional instructions on how to adjust your dosing schedule.        Goals of Therapy     Plaque Psoriasis  ??? Minimize areas of skin involvement (%  BSA)  ??? Avoidance of long term glucocorticoid use  ??? Maintenance of effective psychosocial functioning      Side Effects & Monitoring Parameters     ??? Injection site reaction (redness, irritation, inflammation localized to the site of administration)  ??? Signs of a common cold ??? minor sore throat, runny or stuffy nose, etc.  ??? Diarrhea    The following side effects should be reported to the provider:  ??? Signs of a hypersensitivity reaction ??? rash; hives; itching; red, swollen, blistered, or peeling skin; wheezing; tightness in the chest or throat; difficulty breathing, swallowing, or talking; swelling of the mouth, face, lips, tongue, or throat; etc.  ??? Reduced immune function ??? report signs of infection such as fever; chills; body aches; very bad sore throat; ear or sinus pain; cough; more sputum or change in color of sputum; pain with passing urine; wound that will not heal, etc.  Also at a slightly higher risk of some malignancies (mainly skin and blood cancers) due to this reduced immune function.  o In the case of signs of infection ??? the patient should hold the next dose of Cosentyx?? and call your primary care provider to ensure adequate medical care.  Treatment may be resumed when infection is treated and patient is asymptomatic.  ??? Muscle pain or weakness  ??? Shortness of breath      Warnings, Precautions, & Contraindications     ??? Have your bloodwork checked as you have been told by your prescriber  ??? Talk with your doctor if you are pregnant, planning to become pregnant, or breastfeeding  ??? Discuss the possible need for holding your dose(s) of Cosentyx?? when a planned procedure is scheduled with the prescriber as it may delay healing/recovery timeline       Drug/Food Interactions     ??? Medication list reviewed in Epic. The patient was instructed to inform the care team before taking any new medications or supplements. No drug interactions identified.   ??? If you have a latex allergy use caution when handling, the needle cap of the Cosentyx?? prefilled syringe and the safety cap for the Cosentyx Sensoready?? pen contains a derivative of natural rubber latex  ??? Talk with you prescriber or pharmacist before receiving any live vaccinations while taking this medication and after you stop taking it      Storage, Handling Precautions, & Disposal     ??? Store this medication in the refrigerator.  Do not freeze  ??? If needed, you may store at room temperature for up to 1 hour  ??? Store in original packaging, protected from light  ??? Do not shake  ??? Dispose of used syringes/pens in a sharps disposal container           Current Medications (including OTC/herbals), Comorbidities and Allergies     Current Outpatient Medications   Medication Sig Dispense Refill   ??? aspirin (ECOTRIN) 81 MG tablet Take 81 mg by mouth daily.     ??? blood sugar diagnostic (ON CALL EXPRESS TEST STRIP) Strp Use to check blood sugar Three (3) times a day. 300 each 1   ??? blood-glucose meter kit Use as instructed 1 each 0   ??? brexpiprazole (REXULTI) 2 mg Tab Take 2 mg by mouth daily.     ??? canagliflozin (INVOKANA) 300 mg Tab tablet Take 1 tablet (300 mg total) by mouth every morning before breakfast. 90 tablet 3   ??? celecoxib (CELEBREX) 200 MG capsule Take 1 capsule (200 mg  total) by mouth Two (2) times a day. As needed for pain 60 capsule 3   ??? citalopram (CELEXA) 40 MG tablet Take 40 mg by mouth daily.     ??? clonazePAM (KLONOPIN) 0.5 MG tablet Take 0.5 mg by mouth daily.     ??? dulaglutide 1.5 mg/0.5 mL PnIj Inject 1 mL (3 mg total) under the skin every seven (7) days. 4 mL 6   ??? insulin glargine (LANTUS U-100 INSULIN) 100 unit/mL injection Inject 0.66 mL (66 Units total) under the skin nightly. 10 mL 4   ??? lancets 30 gauge Misc Use as directed to test once daily. 100 each 3   ??? lisinopriL (PRINIVIL,ZESTRIL) 40 MG tablet Take 1 tablet (40 mg total) by mouth daily. 90 tablet 1   ??? metFORMIN (GLUCOPHAGE-XR) 500 MG 24 hr tablet Take 2 tablets (1,000 mg total) by mouth 2 (two) times a day with meals. 180 tablet 3   ??? pantoprazole (PROTONIX) 40 MG tablet Take 1 tablet (40 mg total) by mouth Two (2) times a day. 90 tablet 1   ??? secukinumab (COSENTYX PEN, 2 PENS,) 150 mg/mL PnIj injection Inject the contents of 2 pens (300 mg) under the skin every 4 weeks . 2 mL 12   ??? simvastatin (ZOCOR) 20 MG tablet Take 1 tablet (20 mg total) by mouth every evening. 90 tablet 1   ??? tolterodine (DETROL LA) 4 MG 24 hr capsule Take 1 capsule (4 mg total) by mouth daily. 90 capsule 2     No current facility-administered medications for this visit.       No Known Allergies    Patient Active Problem List   Diagnosis   ??? PFD (pelvic floor dysfunction)   ??? Type 2 diabetes mellitus, with long-term current use of insulin (CMS-HCC)   ??? Hemorrhage of rectum and anus   ??? Hyperlipidemia   ??? Hypertension   ??? Morbid obesity (CMS-HCC)   ??? Dry eye syndrome, bilateral   ??? Angular blepharoconjunctivitis of both eyes   ??? 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 H/O diabetic gastroparesis   ??? Retinopathy of left eye, background, proliferative   ??? Coronary artery disease involving native heart without angina pectoris   ??? Hepatic steatosis       Reviewed and up to date in Epic.    Appropriateness of Therapy     Acute infections noted within Epic:  No active infections  Patient reported infection: None    Is medication and dose appropriate based on diagnosis and infection status? Yes    Prescription has been clinically reviewed: Yes      Baseline Quality of Life Assessment      How many days over the past month did your psoriasis  keep you from your normal activities? For example, brushing your teeth or getting up in the morning. Patient declined to answer    Financial Information     Medication Assistance provided: Prior Authorization    Anticipated copay of $3 reviewed with patient. Verified delivery address.    Delivery Information     Scheduled delivery date: Thurs, 5/12    Expected start date: Thurs, 5/12    Medication will be delivered via Same Day Courier to the prescription address in [facility].  This shipment will not require a signature.      Explained the services we provide at [facility] and that each month we would call to set up refills.  Stressed importance of returning phone calls so that we could ensure they receive their medications in time each month.  Informed patient that we should be setting up refills 7-10 days prior to when they will run out of medication.  A pharmacist will reach out to perform a clinical assessment periodically.  Informed patient that a welcome packet, containing information about our pharmacy and other support services, a Notice of Privacy Practices, and a drug information handout will be sent.      The patient or caregiver noted above participated in the development of this care plan and knows that they can request review of or adjustments to the care plan at any time.      Patient or caregiver verbalized understanding of the above information as well as how to contact the pharmacy at [phone] option 4 with any questions/concerns.  The pharmacy is open Monday through Friday 8:30am-4:30pm.  A pharmacist is available 24/7 via pager to answer any clinical questions they may have.    Patient Specific Needs     - Does the patient have any physical, cognitive, or cultural barriers? No    - Patient prefers to have medications discussed with  Patient     - Is the patient or caregiver able to read and understand education materials at a high school level or above? Yes    - Patient's primary language is  English     - Is the patient high risk? No    - Does the patient require a Care Management Plan? No     - Does the patient require physician intervention or other additional services (i.e. nutrition, smoking cessation, social work)? No      Rosela Supak A Desiree Lucy [facility] Specialty Pharmacist

## 2021-04-10 NOTE — Unmapped (Signed)
[facility] Specialty Medication Onboarding    Specialty Medication: Cosentyx 150mg /ml  Prior Authorization: Approved   Financial Assistance: No - copay  <$25  Final Copay/Day Supply: $3 / 28 days    Insurance Restrictions: None     Notes to Pharmacist:     The triage team has completed the benefits investigation and has determined that the patient is able to fill this medication at [facility]. Please contact the patient to complete the onboarding or follow up with the prescribing physician as needed.

## 2021-04-10 NOTE — Unmapped (Addendum)
1) Have fruit in the evening - nightly               - put it in a bowl               - apples bananas   --going well     Focus Goal   ??  2) Make half the plate vegetable.               - at dinner- nightly               - use frozen or canned   -- still struggling with this

## 2021-04-10 NOTE — Unmapped (Signed)
Please increase your dulaglutide to 3mg  every 7 days. You can take your other medicines as you have been.

## 2021-04-11 MED ADMIN — bevacizumab (AVASTIN) syringe: 1.25 mg | INTRAVITREAL | @ 19:00:00 | Stop: 2021-04-11

## 2021-04-11 MED FILL — INVOKANA 300 MG TABLET: ORAL | 30 days supply | Qty: 30 | Fill #2

## 2021-04-12 NOTE — Unmapped (Signed)
MyChart message sent with negative QuantiFERON test.

## 2021-04-15 MED FILL — CELECOXIB 200 MG CAPSULE: ORAL | 30 days supply | Qty: 60 | Fill #1

## 2021-04-18 ENCOUNTER — Other Ambulatory Visit: Payer: Self-pay

## 2021-04-19 ENCOUNTER — Other Ambulatory Visit: Payer: Self-pay

## 2021-04-23 ENCOUNTER — Other Ambulatory Visit: Payer: Self-pay

## 2021-04-23 DIAGNOSIS — E1169 Type 2 diabetes mellitus with other specified complication: Principal | ICD-10-CM

## 2021-04-23 DIAGNOSIS — Z794 Long term (current) use of insulin: Principal | ICD-10-CM

## 2021-04-23 MED ORDER — METFORMIN ER 500 MG TABLET,EXTENDED RELEASE 24 HR
ORAL_TABLET | Freq: Two times a day (BID) | ORAL | 1 refills | 90 days | Status: CP
Start: 2021-04-23 — End: 2022-04-23
  Filled 2021-04-24: qty 360, 90d supply, fill #0

## 2021-04-24 MED FILL — LANTUS U-100 INSULIN 100 UNIT/ML SUBCUTANEOUS SOLUTION: SUBCUTANEOUS | 15 days supply | Qty: 10 | Fill #1

## 2021-04-30 ENCOUNTER — Other Ambulatory Visit: Payer: Self-pay

## 2021-05-01 NOTE — Unmapped (Signed)
[facility] Refill Coordination Note    Specialty Medication(s) to be Shipped:   Inflammatory Disorders: Cosentyx    Other medication(s) to be shipped: No additional medications requested for fill at this time     Ashley Briggs, DOB: 14-Apr-1966  Phone: [phone] (home)       All above HIPAA information was verified with patient.     Was a Nurse, learning disability used for this call? No    Completed refill call assessment today to schedule patient's medication shipment from the [facility] [phone]).  All relevant notes have been reviewed.     Specialty medication(s) and dose(s) confirmed: Regimen is correct and unchanged.   Changes to medications: Linsie reports no changes at this time.  Changes to insurance: No  New side effects reported not previously addressed with a pharmacist or physician: None reported  Questions for the pharmacist: No    Confirmed patient received a Conservation officer, historic buildings and a Surveyor, mining with first shipment. The patient will receive a drug information handout for each medication shipped and additional FDA Medication Guides as required.       DISEASE/MEDICATION-SPECIFIC INFORMATION        For patients on injectable medications: Patient currently has 0 doses left.  Next injection is scheduled for 05/09/21.    SPECIALTY MEDICATION ADHERENCE     Medication Adherence    Patient reported X missed doses in the last month: 0  Specialty Medication: COSENTYX PEN (2 PENS) 150 mg/mL  Patient is on additional specialty medications: No  Informant: patient              Were doses missed due to medication being on hold? No        REFERRAL TO PHARMACIST     Referral to the pharmacist: Not needed      [facility]     Shipping address confirmed in Epic.     Delivery Scheduled: Yes, Expected medication delivery date: 05/06/21.     Medication will be delivered via Same Day Courier to the prescription address in Epic [facility].    Jasper Loser   [facility] Specialty Technician

## 2021-05-02 ENCOUNTER — Ambulatory Visit: Admit: 2021-05-02 | Discharge: 2021-05-03 | Payer: MEDICAID

## 2021-05-02 DIAGNOSIS — I2510 Atherosclerotic heart disease of native coronary artery without angina pectoris: Principal | ICD-10-CM

## 2021-05-02 DIAGNOSIS — I10 Essential (primary) hypertension: Principal | ICD-10-CM

## 2021-05-02 DIAGNOSIS — R42 Dizziness and giddiness: Principal | ICD-10-CM

## 2021-05-02 NOTE — Unmapped (Signed)
Assessment and Plan:     Ashley Briggs was seen today for dizziness, nausea and headache.    Diagnoses and all orders for this visit:    Dizzy  -     POCT hemoglobin: 10.1  -     POCT glucose: 227  -     ECG 12 Lead: normal axis, prolonged QT-but no change from previous EKG in 6/21  -     Orthostatic hypotension v anemia, does not appear due to hypoglycemic episodes or adverse effects from medications.  -     Advised caution with position changes, increase fluid intake, preferably liquids without caffeine, sugar, artificial sweeteners, wear compression stockings, elevation of legs  -    Hemoglobin: 10.1--can start one tablet of iron with water, increase iron rich foods  -    Referred for echo, cardiology  -    Return and ED precautions discussed.        Barriers to recommended plan: financial, transportation    Return if symptoms worsen or fail to improve, for 2-3 weeks: PCP.      Subjective:     HPI: Ashley Briggs is a 55 y.o. female here for Dizziness (Patient states she has felt light headed since last Wednesday. Currently feels extremely dizzy. BP has been fluctuating from 87/38. Patient states she has been drinking plenty of fluids. Loss of taste  ), Nausea, and Headache (Patient reports feeling pressure on her head however no pain ).    Dizziness, nausea, blood pressure dropping:    When gets up too fast--blacked out.  Bending over-dizziness, sweating, feels like she will pass out, but does not.  Going on since last Wednesday-(04/24/21)--sometimes drops sitting still  + nausea--extreme, but not vomiting, Eating, drinking okay. Drinking 40-60 oz water per day, eats a lot of fruit. Trying to eat more protein, less sweets. Meeting w/RD and endocrine team-no episodes of hypoglycemia.. Some twinges in left upper left chest--no: chest pains, no tachycardia, no palpitations, no pins/needles/weakness in bilateral upper or lower extremities.  Complains of head pressure-no visual changes, no throat pain, no sinus congestion, no runny nose.  Has been taking lisinopril 40 mg for many years without adverse effect.  Prior to onset of dizziness, no viral URI, no seasonal allergies, no fever, no chills.  No known sick contacts.    DM:  Working with endocrinologist and registered dietitian to improve glycemic control.  Recent visit 04/10/2021  Last check blood sugar 3 days ago, thinks was 247 after eating some fruit.  Invokana 300mg  daily, dulaglutide 1.5mg  q7 days, lantus 66 units nightly, and metformin 1000mg  BID.     Psoriasis:  Managed by dermatology has been taking Cosentyx pens for past year without adverse effect.    Mental health: has been taking rexulti and clonazepam for many years without ill effect    CBC:  Noted drop in hgb 12.2 (09/21)---9.9 (4/22)  Today's level 10.1  Glucose: 227  Colonoscopy due in 2031    I have reviewed past medical, surgical, medications, allergies, social and family histories today and updated them in Epic where appropriate.    ROS:        Review of systems negative unless otherwise noted as per HPI.      Objective:     Vitals:    05/02/21 1621   Temp:    SpO2: 98%     Body mass index is 44.8 kg/m??.    Physical Exam  Vitals and nursing note reviewed.  Constitutional:       Appearance: Normal appearance.   HENT:      Head: Normocephalic and atraumatic.      Right Ear: Tympanic membrane, ear canal and external ear normal.      Left Ear: Tympanic membrane, ear canal and external ear normal.      Nose: Nose normal.      Mouth/Throat:      Mouth: Mucous membranes are moist.      Pharynx: Oropharynx is clear.   Eyes:      Extraocular Movements: Extraocular movements intact.      Conjunctiva/sclera: Conjunctivae normal.      Pupils: Pupils are equal, round, and reactive to light.      Comments: Left eyelid slightly lower than R eye lid--upper eyelid: chronic problem   Cardiovascular:      Rate and Rhythm: Normal rate and regular rhythm.      Pulses: Normal pulses.      Heart sounds: Normal heart sounds.   Pulmonary:      Effort: Pulmonary effort is normal.      Breath sounds: Normal breath sounds.   Musculoskeletal:         General: Normal range of motion.      Cervical back: Normal range of motion and neck supple.   Skin:     General: Skin is warm and dry.      Capillary Refill: Capillary refill takes less than 2 seconds.   Neurological:      General: No focal deficit present.      Mental Status: She is alert and oriented to person, place, and time. Mental status is at baseline.      Cranial Nerves: No cranial nerve deficit.      Sensory: No sensory deficit.      Motor: No weakness.      Coordination: Coordination normal.      Gait: Gait normal.      Deep Tendon Reflexes: Reflexes normal.   Psychiatric:         Mood and Affect: Mood normal.         Behavior: Behavior normal.         Thought Content: Thought content normal.         Judgment: Judgment normal.            Medication adherence and barriers to the treatment plan have been addressed. Opportunities to optimize healthy behaviors have been discussed. Patient / caregiver voiced understanding.   I personally spent 40 minutes face-to-face and non-face-to-face in the care of this patient, which includes all pre, intra, and post visit time on the date of service.  Cleon Dew, DNP, FNP-C  [facility]  [phone] (F)    Note - This record has been created using [employer]. Chart creation errors have been sought, but may not always have been located. Such creation errors do not reflect on the standard of medical care.

## 2021-05-06 ENCOUNTER — Other Ambulatory Visit: Payer: Self-pay

## 2021-05-06 MED FILL — COSENTYX PEN 300 MG/2 PENS (150 MG/ML) SUBCUTANEOUS: 28 days supply | Qty: 2 | Fill #1

## 2021-05-08 ENCOUNTER — Ambulatory Visit: Admit: 2021-05-08 | Discharge: 2021-05-09 | Payer: MEDICAID

## 2021-05-08 MED ADMIN — bevacizumab (AVASTIN) syringe: 1.25 mg | INTRAVITREAL | @ 21:00:00

## 2021-05-08 NOTE — Unmapped (Signed)
[facility]    Initial History of present illness:   55 year old female referred from Dr. Julaine Hua for evaluation of diabetic retinopathy.  At her visit 12/11/2020 patient was noted to have macular edema in the left eye more than the right.    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     Date Medication Interval (w) Comments   01/14/2021   Avastin     04/08/2021  12 exam   05/08/2021  4                          Follow up 4 weeks OCT, Avastin Left, no dilation      # Blepharitis both eyes   Followed by Dr. Julaine Hua  Continue lubrication and lid hygiene    # Ptosis Left Upper Lid  Patient has droopy LUL that isn't bothering her vision.  Observe

## 2021-05-09 MED FILL — INVOKANA 300 MG TABLET: ORAL | 30 days supply | Qty: 30 | Fill #3

## 2021-05-14 ENCOUNTER — Other Ambulatory Visit: Payer: Self-pay

## 2021-05-14 ENCOUNTER — Telehealth: Payer: Self-pay | Admitting: Pharmacist

## 2021-05-14 NOTE — Telephone Encounter (Signed)
Patient approved for medication assistance at [facility] until 01/29/22, as long as eligibility criteria continues to be met.   Vonda [facility] Administrative Assistant 

## 2021-05-15 MED FILL — LANTUS U-100 INSULIN 100 UNIT/ML SUBCUTANEOUS SOLUTION: SUBCUTANEOUS | 15 days supply | Qty: 10 | Fill #2

## 2021-05-15 MED FILL — CELECOXIB 200 MG CAPSULE: ORAL | 30 days supply | Qty: 60 | Fill #2

## 2021-05-17 ENCOUNTER — Ambulatory Visit: Admit: 2021-05-17 | Discharge: 2021-05-18 | Payer: MEDICAID | Attending: Family | Primary: Family

## 2021-05-17 DIAGNOSIS — K760 Fatty (change of) liver, not elsewhere classified: Principal | ICD-10-CM

## 2021-05-17 DIAGNOSIS — R110 Nausea: Principal | ICD-10-CM

## 2021-05-17 DIAGNOSIS — Z1231 Encounter for screening mammogram for malignant neoplasm of breast: Principal | ICD-10-CM

## 2021-05-17 DIAGNOSIS — G8929 Other chronic pain: Principal | ICD-10-CM

## 2021-05-17 DIAGNOSIS — M25562 Pain in left knee: Principal | ICD-10-CM

## 2021-05-17 MED ORDER — PROMETHAZINE 25 MG TABLET
ORAL_TABLET | Freq: Every evening | ORAL | 1 refills | 30 days | Status: CP
Start: 2021-05-17 — End: 2021-05-31
  Filled 2021-05-21: qty 30, 30d supply, fill #0

## 2021-05-17 NOTE — Unmapped (Signed)
[facility] Note:  [place], [place] [address]. Phone [phone]    05/18/2021    Patient Name:   Ashley Briggs    MRN: [id number]    Demographics:    Age-  55 y.o.     Date of Birth-  1966-01-12    Chief complaint (CC):    Chief Complaint   Patient presents with   ??? Follow-up     From ~2 weeks ago for dizziness and black out.  Patient states that she is still having some dizziness, feels a little shaky, somewhat sob.    ??? Sore Throat     2 days of sore throat especially when swallowing.        Assessment/Plan:    Oliveah is a very pleasant 55 y.o. female with CAD, HTN, Hepatic Steatosis, improving DM2 (HgbA1c 8.9 on 04/10/21, was 10.9), Hyperlipidemia, GERD (stable), and arthritis pain. C/O recent dizziness (improved), nausea, and ST.  I added Promethazine 25 mg before bed. Advised to continue mediations as listed and follow diabetic style/carb consistent diet. Advised to follow up with Cardiology for Echocardiogram and consult.  Mammogram ordered today for HM.    30 minutes of clinical time, approx 1/2 of time was face to face time. We have discussed medical, dietary, lifestyle, and health maintenance modifications to optimize health. Due to COVID-19, masks worn and standard precautions followed during visit. Medication adherence and barriers to the treatment plan have been addressed.  Patient voiced understanding, needs F/U visit as scheduled.     Diagnosis ICD-10-CM Plan/Associated Orders   1.          2 Nausea/Sore throat  Likely related to diet, GERD, and medications.      Dizziness with concern about CAD and HTN, symptoms have improved       R11.0 promethazine (PHENERGAN) 25 MG tablet, 1 before bed        Discussed consult with Cardiology on dizziness and Hx of CAD and HTN, continue current meds lisinopril, ASA,  and simvastain.   3. Chronic pain of left knee  M25.562 Celebrex bid prn, discussed joint injection with Orth      G89.29    4. Hepatic steatosis  K76.0 Diabetic eating plan.     5. Encounter for screening mammogram for malignant neoplasm of breast  Z12.31 Mammography screening bilateral        Health Maintenance:   Health Maintenance Due   Topic Date Due   ??? COVID-19 Vaccine (1) Advised and offered booster   ??? Retinal Eye Exam  Advised and ordered   ??? Mammogram Start Age 76  Advised and ordered     ??? Zoster Vaccines (1 of 2) Advised, agreeable     Reviewed:  Immunizations: TDaP, Influenza, Pneumovax, Shingles - information given, advised and pt agreeable  Mammograms - 2 years ago      Subjective:    History of present illness (HPI):       Ashley Briggs is a 55 y.o. female for office visit. She complains of dizziness. Associating symptoms include intermittent nausea which last a couple of minutes and up to 30 minutes. She is concerned about her heart.  She also complains of sore throat and shakiness. Reports episode of shortness of breath and chest pain. States echocardiogram scheduled for 05/27/21 and will follow up with her cardiologist on 05/30/21. She follows up with a nutritionist. Reports history of arthritis in left knee and will be following up with orthopedics. Patient's  medical history was reviewed, see Past Medical History. Medications used in the past were reviewed, see medications. Patient reports eating and eliminating well. Pt is sleeping well. Patient has not required emergency room treatment for these symptoms, and has not required hospitalization.     Denies HA, Fever, chest pain, N/V/D, bowel or bladder issues, shortness of breath, headache or swelling.     Relevant ROS: Reviewed 12 systems, positive findings listed, all others negative.    Pertinent Past Med Hx:    Past Medical History:   Diagnosis Date   ??? Anxiety    ??? Cancer (CMS-HCC)     endometrial 2007   ??? Diabetes mellitus (CMS-HCC)    ??? Gastroparesis    ??? Heart disease    ??? High cholesterol    ??? Hypertension    ??? Liver disease    ??? Non-alcoholic fatty liver disease    ??? Psoriasis    ??? Reflux        Medications: Current Outpatient Medications:   ???  aspirin (ECOTRIN) 81 MG tablet, Take 81 mg by mouth daily., Disp: , Rfl:   ???  blood sugar diagnostic (ON CALL EXPRESS TEST STRIP) Strp, Use to check blood sugar Three (3) times a day., Disp: 300 each, Rfl: 1  ???  blood-glucose meter kit, Use as instructed, Disp: 1 each, Rfl: 0  ???  brexpiprazole (REXULTI) 2 mg Tab, Take 2 mg by mouth daily., Disp: , Rfl:   ???  canagliflozin (INVOKANA) 300 mg Tab tablet, Take 1 tablet (300 mg total) by mouth every morning before breakfast., Disp: 90 tablet, Rfl: 3  ???  celecoxib (CELEBREX) 200 MG capsule, Take 1 capsule (200 mg total) by mouth Two (2) times a day. As needed for pain, Disp: 60 capsule, Rfl: 3  ???  citalopram (CELEXA) 40 MG tablet, Take 40 mg by mouth daily., Disp: , Rfl:   ???  clonazePAM (KLONOPIN) 0.5 MG tablet, Take 0.5 mg by mouth daily., Disp: , Rfl:   ???  dulaglutide 1.5 mg/0.5 mL PnIj, Inject the contents of 2 syringes (3 mg total) under the skin every seven (7) days., Disp: 4 mL, Rfl: 6  ???  insulin glargine (LANTUS U-100 INSULIN) 100 unit/mL injection, Inject 0.66 mL (66 Units total) under the skin nightly., Disp: 10 mL, Rfl: 4  ???  lancets 30 gauge Misc, Use as directed to test once daily., Disp: 100 each, Rfl: 3  ???  lisinopriL (PRINIVIL,ZESTRIL) 40 MG tablet, Take 1 tablet (40 mg total) by mouth daily., Disp: 90 tablet, Rfl: 1  ???  metFORMIN (GLUCOPHAGE-XR) 500 MG 24 hr tablet, Take 2 tablets (1,000 mg total) by mouth in the morning and 2 tablets (1,000 mg total) in the evening. Take with meals., Disp: 360 tablet, Rfl: 1  ???  pantoprazole (PROTONIX) 40 MG tablet, Take 1 tablet (40 mg total) by mouth Two (2) times a day., Disp: 90 tablet, Rfl: 1  ???  secukinumab (COSENTYX PEN, 2 PENS,) 150 mg/mL PnIj injection, Inject the contents of 2 pens (300 mg) under the skin every 4 weeks ., Disp: 2 mL, Rfl: 12  ???  simvastatin (ZOCOR) 20 MG tablet, Take 1 tablet (20 mg total) by mouth every evening., Disp: 90 tablet, Rfl: 1  ???  tolterodine (DETROL LA) 4 MG 24 hr capsule, Take 1 capsule (4 mg total) by mouth daily., Disp: 90 capsule, Rfl: 2  ???  promethazine (PHENERGAN) 25 MG tablet, Take 1 tablet (25 mg total) by mouth at  bedtime for 14 days. May repeat during day as needed. Medication for nausea., Disp: 30 tablet, Rfl: 1     Allergies:   No Known Allergies    Pertinent Social Hx and Habits: EMR reviewed    Pertinent Family Hx: EMR reviewed    Objective:      Vitals:    05/17/21 1539   BP: 108/52   BP Site: R Arm   BP Position: Sitting   Pulse: 98   SpO2: 98%   Weight: (!) 120.2 kg (265 lb)   Height: 162.6 cm (5' 4)   Body mass index is 45.49 kg/m??.       Physical Exam:    General Appearance: WDWN in NAD      Skin: W, D, I  HEENT: PERRLA, EOMI, TM's clear  Respiratory: Clear throughout  Cardio: RRR  Abdomen: Soft and non-tnder  Neurologic: A & O X 4, Grossly intact, stable gait  PSYCH: Behavior calm and cooperative      Diagnostics:     Lab Results   Component Value Date    WBC 3.2 (L) 03/13/2021    HGB 10.1 (A) 05/02/2021    HCT 31.0 (L) 03/13/2021    PLT 123 (L) 03/13/2021       Lab Results   Component Value Date    NA 136 03/13/2021    K 4.4 03/13/2021    CL 103 03/13/2021    CO2 23.0 03/13/2021    BUN 12 03/13/2021    CREATININE 0.73 03/13/2021    GLU 267 (H) 03/13/2021    CALCIUM 9.8 03/13/2021       Lab Results   Component Value Date    BILITOT 1.0 03/13/2021    PROT 7.6 03/13/2021    ALBUMIN 3.3 (L) 03/13/2021    ALT 45 03/13/2021    AST 69 (H) 03/13/2021    ALKPHOS 137 (H) 03/13/2021       Lab Results   Component Value Date    PT 12.2 01/31/2011    INR 1.1 01/31/2011    APTT 30.7 01/31/2011       Lab Results   Component Value Date    A1C 8.9 (A) 04/10/2021       I attest that I, Queen Blossom, personally documented this note while acting as scribe for Olena Leatherwood, NP.    Queen Blossom, Scribe.    The documentation recorded by the scribe accurately reflects the service I personally performed and the decisions made by me.    Olena Leatherwood, NP.    Dr. Betha Loa, DNP, FNP-BC  Board Certified Doctor of Nursing Practice   [phone]

## 2021-05-22 ENCOUNTER — Other Ambulatory Visit: Payer: Self-pay

## 2021-05-24 ENCOUNTER — Other Ambulatory Visit: Payer: Self-pay

## 2021-05-24 ENCOUNTER — Emergency Department: Payer: Medicaid Other

## 2021-05-24 ENCOUNTER — Emergency Department
Admission: EM | Admit: 2021-05-24 | Discharge: 2021-05-24 | Disposition: A | Discharge status: Home / Self Care | Payer: Medicaid Other | Attending: Emergency Medicine | Admitting: Emergency Medicine

## 2021-05-24 DIAGNOSIS — I10 Essential (primary) hypertension: Principal | ICD-10-CM

## 2021-05-24 DIAGNOSIS — Z794 Long term (current) use of insulin: Principal | ICD-10-CM

## 2021-05-24 DIAGNOSIS — E785 Hyperlipidemia, unspecified: Principal | ICD-10-CM

## 2021-05-24 DIAGNOSIS — E1169 Type 2 diabetes mellitus with other specified complication: Principal | ICD-10-CM

## 2021-05-24 DIAGNOSIS — Z7982 Long term (current) use of aspirin: Secondary | ICD-10-CM | POA: Insufficient documentation

## 2021-05-24 DIAGNOSIS — L03011 Cellulitis of right finger: Secondary | ICD-10-CM

## 2021-05-24 DIAGNOSIS — Z23 Encounter for immunization: Secondary | ICD-10-CM | POA: Insufficient documentation

## 2021-05-24 DIAGNOSIS — E119 Type 2 diabetes mellitus without complications: Secondary | ICD-10-CM | POA: Diagnosis not present

## 2021-05-24 DIAGNOSIS — Z7984 Long term (current) use of oral hypoglycemic drugs: Secondary | ICD-10-CM | POA: Diagnosis not present

## 2021-05-24 DIAGNOSIS — Z8542 Personal history of malignant neoplasm of other parts of uterus: Secondary | ICD-10-CM | POA: Insufficient documentation

## 2021-05-24 DIAGNOSIS — Z79899 Other long term (current) drug therapy: Secondary | ICD-10-CM | POA: Insufficient documentation

## 2021-05-24 DIAGNOSIS — M7989 Other specified soft tissue disorders: Secondary | ICD-10-CM | POA: Diagnosis present

## 2021-05-24 MED ORDER — LISINOPRIL 40 MG TABLET
ORAL_TABLET | Freq: Every day | ORAL | 1 refills | 90 days | Status: CP
Start: 2021-05-24 — End: 2022-05-24
  Filled 2021-05-29: qty 90, 90d supply, fill #0

## 2021-05-24 MED ORDER — CEPHALEXIN 500 MG CAPSULE
0 days
Start: 2021-05-24 — End: ?

## 2021-05-24 MED ORDER — BLOOD-GLUCOSE METER KIT WRAPPER
0 refills | 0 days | Status: CP
Start: 2021-05-24 — End: 2022-05-24
  Filled 2021-05-29: qty 1, 90d supply, fill #0

## 2021-05-24 MED ORDER — SIMVASTATIN 20 MG TABLET
ORAL_TABLET | Freq: Every evening | ORAL | 1 refills | 90 days | Status: CP
Start: 2021-05-24 — End: 2022-05-24
  Filled 2021-05-29: qty 90, 90d supply, fill #0

## 2021-05-24 MED ORDER — TETANUS-DIPHTH-ACELL PERTUSSIS 5-2.5-18.5 LF-MCG/0.5 IM SUSY
0.5000 mL | PREFILLED_SYRINGE | Freq: Once | INTRAMUSCULAR | Status: AC
  Administered 2021-05-24: 0.5 mL via INTRAMUSCULAR
  Filled 2021-05-24: qty 0.5

## 2021-05-24 MED ORDER — CEPHALEXIN 500 MG PO CAPS: 500.0000 mg | ORAL_CAPSULE | Freq: Four times a day (QID) | ORAL | 0 refills | Status: AC

## 2021-05-24 MED ORDER — CEPHALEXIN 500 MG PO CAPS
500.0000 mg | ORAL_CAPSULE | Freq: Four times a day (QID) | ORAL | 0 refills | Status: DC
  Filled 2021-05-24: qty 20, 5d supply, fill #0

## 2021-05-24 NOTE — ED Triage Notes (Signed)
Pt here with finger pain the the ring finger on her right hand. Pt denies injury but states that it is painful and pus has began to come out of her finger. Pt states that is has been hurting for a few days now.

## 2021-05-24 NOTE — ED Provider Notes (Signed)
[facility] Emergency Department Provider Note  ____________________________________________   Event Date/Time   First MD Initiated Contact with Patient 05/24/21 1351     (approximate)  I have reviewed the triage vital signs and the nursing notes.   HISTORY  Chief Complaint Finger Injury   HPI Abigail Molina is a 55 y.o. female with past medical history of DM, GERD, HTN, HDL, endometrial cancer and overactive bladder who presents for assessment of 2 to 3 days of some pain redness and swelling tip of her right third digit.  Patient states some pus drained out of it the other day but is since stopped.  Cannot recall any injuries.  He is not sure when her last tetanus shot.  She has no other areas of redness or pain in any other fingers or the hand.  No recent fevers, chills, cough, nausea, vomiting, [place] dysuria, rash or any other acute sick symptoms.          Past Medical History:  Diagnosis Date   Cancer [facility])    Endometrial cancer   Depression    Diabetes mellitus without complication ([place])    Fatty liver disease, nonalcoholic 4196   GERD (gastroesophageal reflux disease)    Hyperlipidemia    Hypertension    Overactive bladder    Psoriasis 2015    Patient Active Problem List   Diagnosis Date Noted   Abdominal pain 03/04/2018   Diabetes ([place]) 12/12/2015   Hyperlipidemia 12/12/2015   Hypertension 05/23/2015   Acid reflux 05/23/2015    Past Surgical History:  Procedure Laterality Date   ABDOMINAL HYSTERECTOMY  2007   CHOLECYSTECTOMY  2000    Prior to Admission medications   Medication Sig Start Date End Date Taking? Authorizing Provider  aspirin 81 MG tablet Take 81 mg by mouth daily.    [provider]  brexpiprazole (REXULTI) 2 MG TABS tablet Take 2 mg by mouth.    [provider]  brexpiprazole (REXULTI) 2 MG TABS tablet TAKE ONE TABLET BY MOUTH AT BEDTIME 12/19/20 12/19/21  Boyce Medici, FNP  brexpiprazole  (REXULTI) 2 MG TABS tablet Take 1 tablet by mouth at bedtime. 03/06/21     cephALEXin (KEFLEX) 500 MG capsule Take 1 capsule (500 mg total) by mouth 4 (four) times daily for 5 days. 05/24/21 05/29/21  Lucrezia Starch, MD  citalopram (CELEXA) 40 MG tablet Take 40 mg by mouth daily.     [provider]  citalopram (CELEXA) 40 MG tablet TAKE ONE TABLET BY MOUTH EVERY MORNING 12/19/20 07/31/21  Boyce Medici, FNP  citalopram (CELEXA) 40 MG tablet Take 1 tablet by mouth every morning 03/06/21     Dulaglutide (TRULICITY) 1.5 QI/2.9NL SOPN Inject 1.5 mg into the skin once a week. Thursdays    [provider]  insulin glargine (LANTUS) 100 UNIT/ML injection Inject 0.5 mLs (50 Units total) into the skin at bedtime. Inject 42 units once daily. Patient taking differently: Inject 66 Units into the skin at bedtime. 06/17/18   Doles-Johnson, Teah, NP  Insulin Pen Needle 32G X 6 MM MISC 1 Syringe by Does not apply route. Use with Victoza.    [provider]  lisinopril (ZESTRIL) 40 MG tablet TAKE ONE TABLET BY MOUTH EVERY DAY 03/20/20   McGowan, Larene Beach A, PA-C  metFORMIN (GLUCOPHAGE) 1000 MG tablet TAKE ONE TABLET BY MOUTH 2 TIMES A DAY WITH A MEAL. 10/24/19   Zara Council A, PA-C  mometasone (ELOCON) 0.1 % ointment Apply  topically daily. Apply as needed for psoriasis Patient not taking: Reported on 12/28/2020    [provider]  Multiple Vitamin (MULTIVITAMIN) tablet Take 1 tablet by mouth daily. Patient not taking: Reported on 12/28/2020    [provider]  naproxen sodium (ALEVE) 220 MG tablet Take 220 mg by mouth 2 (two) times daily as needed (pain).    [provider]  oxyCODONE (ROXICODONE) 5 MG immediate release tablet 1 every 6-8 hours as needed for pain. Patient not taking: No sig reported 02/26/19   Letitia Neri L, PA-C  pantoprazole (PROTONIX) 40 MG tablet TAKE ONE TABLET BY MOUTH EVERY DAY 10/24/19   McGowan, Larene Beach A, PA-C  simvastatin (ZOCOR) 20 MG  tablet TAKE ONE TABLET BY MOUTH EVERY EVENING 10/24/19   McGowan, Larene Beach A, PA-C  tolterodine (DETROL LA) 2 MG 24 hr capsule Take 2 mg by mouth daily.    [provider]  VICTOZA 18 MG/3ML SOPN Inject 1.2 mg once daily Patient not taking: Reported on 12/28/2020 12/03/17   Doles-Johnson, Teah, NP    Allergies Patient has no known allergies.  Family History  Problem Relation Age of Onset   Diabetes Mother    Hypertension Mother    Depression Mother     Social History Social History   Tobacco Use   Smoking status: Never   Smokeless tobacco: Never  Vaping Use   Vaping Use: Never used  Substance Use Topics   Alcohol use: Yes    Comment:  Rarely. One or two drinks a year    Drug use: No    Review of Systems  Review of Systems  Constitutional:  Negative for chills and fever.  HENT:  Negative for sore throat.   Eyes:  Negative for pain.  Respiratory:  Negative for cough and stridor.   Cardiovascular:  Negative for chest pain.  Gastrointestinal:  Negative for vomiting.  Genitourinary:  Negative for dysuria.  Musculoskeletal:  Positive for myalgias (R 2nd digit tip).  Skin:  Negative for rash.  Neurological:  Negative for seizures, loss of consciousness and headaches.  Psychiatric/Behavioral:  Negative for suicidal ideas.   All other systems reviewed and are negative.    ____________________________________________   PHYSICAL EXAM:  VITAL SIGNS: ED Triage Vitals [05/24/21 1315]  Enc Vitals Group     BP (!) 153/76     Pulse Rate 97     Resp 20     Temp 98.5 F (36.9 C)     Temp Source Oral     SpO2 97 %     Weight 265 lb (120.2 kg)     Height 5\' 4"  (1.626 m)     Head Circumference      Peak Flow      Pain Score 4     Pain Loc      Pain Edu?      Excl. in [place]?    Vitals:   05/24/21 1315 05/24/21 1420  BP: (!) 153/76 104/84  Pulse: 97   Resp: 20 18  Temp: 98.5 F (36.9 C)   SpO2: 97% 100%   Physical Exam Vitals and nursing note reviewed.   Constitutional:      General: She is not in acute distress.    Appearance: She is well-developed. She is obese.  HENT:     Head: Normocephalic and atraumatic.     Right Ear: External ear normal.     Left Ear: External ear normal.     Nose: Nose normal.  Mouth/Throat:     Mouth: Mucous membranes are moist.  Eyes:     Conjunctiva/sclera: Conjunctivae normal.  Cardiovascular:     Rate and Rhythm: Normal rate and regular rhythm.     Heart sounds: No murmur heard. Pulmonary:     Effort: Pulmonary effort is normal. No respiratory distress.     Breath sounds: Normal breath sounds.  Abdominal:     Palpations: Abdomen is soft.     Tenderness: There is no abdominal tenderness.  Musculoskeletal:     Cervical back: Neck supple.  Skin:    General: Skin is warm and dry.     Capillary Refill: Capillary refill takes less than 2 seconds.  Neurological:     Mental Status: She is alert and oriented to person, place, and time.  Psychiatric:        Mood and Affect: Mood normal.    Is an area of joint erythema edema and tenderness proximal to the nailbed and laterally on the right fourth digit.  No involvement on the pad of the digit or proximally.  Patient will flex and extend digit against resistance.  Remainder of hand is unremarkable. ____________________________________________   LABS (all labs ordered are listed, but only abnormal results are displayed)  Labs Reviewed - No data to display ____________________________________________  EKG  ____________________________________________  RADIOLOGY  ED MD interpretation: No acute fracture or dislocation.  Official radiology report(s): DG Finger Ring Right  Result Date: 05/24/2021 CLINICAL DATA:  Pain and swelling RIGHT ring finger for 2 days, poss coming from area, no injury EXAM: RIGHT RING FINGER 2+V COMPARISON:  None FINDINGS: Soft tissue swelling at distal phalanx RIGHT ring finger. Osseous mineralization normal. Joint spaces  preserved. No fracture, dislocation, or bone destruction. IMPRESSION: Soft tissue swelling without acute osseous abnormalities. Electronically Signed   By: Lavonia Dana M.D.   On: 05/24/2021 14:19    ____________________________________________   PROCEDURES  Procedure(s) performed (including Critical Care):  Marland KitchenMarland KitchenIncision and Drainage  Date/Time: 05/24/2021 2:25 PM Performed by: Lucrezia Starch, MD Authorized by: Lucrezia Starch, MD   Consent:    Consent obtained:  Verbal   Consent given by:  Patient   Risks, benefits, and alternatives were discussed: yes     Risks discussed:  Bleeding and incomplete drainage   Alternatives discussed:  No treatment Universal protocol:    Procedure explained and questions answered to patient or proxy's satisfaction: yes     Patient identity confirmed:  Verbally with patient Location:    Location:  Upper extremity   Upper extremity location:  Finger   Finger location:  R ring finger Pre-procedure details:    Skin preparation:  Chlorhexidine with alcohol Sedation:    Sedation type:  None Anesthesia:    Anesthesia method:  None Procedure type:    Complexity:  Simple Procedure details:    Ultrasound guidance: no     Needle aspiration: no     Incision types:  Stab incision   Drainage:  Serosanguinous   Wound treatment:  Wound left open   Packing materials:  None Post-procedure details:    Procedure completion:  Tolerated well, no immediate complications   ____________________________________________   INITIAL IMPRESSION / ASSESSMENT AND PLAN / ED COURSE      Patient presents with above to history exam for assessment of redness swelling and pain on the distal aspect of her right fourth digit.  On arrival she is afebrile hemodynamically stable.  Exam is most consistent with a paronychia.  I&D  performed with some drainage.  No evidence on exam of a felon or flexor tenosynovitis.  X-ray shows no underlying fracture or dislocation.  Tetanus  updated.  Will write short course of Keflex.  Discharged stable condition.  Strict return cautions advised and discussed.         ____________________________________________   FINAL CLINICAL IMPRESSION(S) / ED DIAGNOSES  Final diagnoses:  Paronychia of finger of right hand    Medications  Tdap (BOOSTRIX) injection 0.5 mL (0.5 mLs Intramuscular Given 05/24/21 1415)     ED Discharge Orders          Ordered    cephALEXin (KEFLEX) 500 MG capsule  4 times daily,   Status:  Discontinued        05/24/21 1410    cephALEXin (KEFLEX) 500 MG capsule  4 times daily        05/24/21 1427             Note:  This document was prepared using Dragon voice recognition software and may include unintentional dictation errors.    Lucrezia Starch, MD 05/24/21 [phone]

## 2021-05-24 NOTE — ED Notes (Signed)
Discharge instructions reviewed with pt, Pt calm , collective , denied pain or sob. Pt had no signs or symptoms of an allergic reaction after vaccine given.

## 2021-05-27 ENCOUNTER — Ambulatory Visit: Admit: 2021-05-27 | Discharge: 2021-05-28 | Payer: MEDICAID

## 2021-05-27 DIAGNOSIS — I10 Essential (primary) hypertension: Principal | ICD-10-CM

## 2021-05-27 DIAGNOSIS — R42 Dizziness and giddiness: Principal | ICD-10-CM

## 2021-05-27 DIAGNOSIS — I2510 Atherosclerotic heart disease of native coronary artery without angina pectoris: Principal | ICD-10-CM

## 2021-05-27 MED ADMIN — perflutren lipid microspheres (DEFINITY) injection 1.32 mL: 1.32 mL | INTRAVENOUS | @ 15:00:00 | Stop: 2021-05-27

## 2021-05-28 ENCOUNTER — Other Ambulatory Visit: Payer: Self-pay

## 2021-05-28 NOTE — Unmapped (Signed)
Hello,    Thanks for having your echocardiogram done. The cardiologist will review this further with you during your appointment, but the echo is essentially normal.    Please attend your cardiology appointment on 6/30.    Thanks,  AM

## 2021-05-29 ENCOUNTER — Other Ambulatory Visit: Payer: Self-pay

## 2021-05-29 MED FILL — ON CALL EXPRESS TEST STRIP: 100 days supply | Qty: 300 | Fill #1

## 2021-05-29 MED FILL — TOLTERODINE ER 4 MG CAPSULE,EXTENDED RELEASE 24 HR: ORAL | 90 days supply | Qty: 90 | Fill #1

## 2021-05-29 NOTE — Unmapped (Signed)
[facility] Refill Coordination Note    Specialty Medication(s) to be Shipped:   Inflammatory Disorders: Cosentyx    Other medication(s) to be shipped: No additional medications requested for fill at this time     Brie Eppard, DOB: 26-May-1966  Phone: [phone] (home)       All above HIPAA information was verified with patient.     Was a Nurse, learning disability used for this call? No    Completed refill call assessment today to schedule patient's medication shipment from the [facility] [phone]).  All relevant notes have been reviewed.     Specialty medication(s) and dose(s) confirmed: Regimen is correct and unchanged.   Changes to medications: [place] reports no changes at this time.  Changes to insurance: No  New side effects reported not previously addressed with a pharmacist or physician: None reported  Questions for the pharmacist: No    Confirmed patient received a Conservation officer, historic buildings and a Surveyor, mining with first shipment. The patient will receive a drug information handout for each medication shipped and additional FDA Medication Guides as required.       DISEASE/MEDICATION-SPECIFIC INFORMATION        For patients on injectable medications: Patient currently has 0 doses left.  Next injection is scheduled for 06/06/2021.    SPECIALTY MEDICATION ADHERENCE     Medication Adherence    Patient reported X missed doses in the last month: 0  Specialty Medication: COSENTYX PEN (2 PENS) 150 mg/mL  Patient is on additional specialty medications: No  Any gaps in refill history greater than 2 weeks in the last 3 months: no  Demonstrates understanding of importance of adherence: yes  Informant: patient  Reliability of informant: reliable  Confirmed plan for next specialty medication refill: delivery by pharmacy  Refills needed for supportive medications: not needed              Were doses missed due to medication being on hold? No        REFERRAL TO PHARMACIST     Referral to the pharmacist: Not needed      [facility]     Shipping address confirmed in Epic.     Delivery Scheduled: Yes, Expected medication delivery date: 05/31/2021.     Medication will be delivered via Same Day Courier to the prescription address in Epic WAM.    Layden Caterino D Damarri Rampy   [facility] Specialty Technician

## 2021-05-30 ENCOUNTER — Other Ambulatory Visit: Payer: Self-pay

## 2021-05-30 DIAGNOSIS — R1013 Epigastric pain: Principal | ICD-10-CM

## 2021-05-30 MED ORDER — PANTOPRAZOLE 40 MG TABLET,DELAYED RELEASE
ORAL_TABLET | Freq: Two times a day (BID) | ORAL | 1 refills | 90 days | Status: CP
Start: 2021-05-30 — End: ?
  Filled 2021-05-31: qty 180, 90d supply, fill #0

## 2021-05-30 NOTE — Unmapped (Signed)
Patient is requesting the following refill  Requested Prescriptions     Pending Prescriptions Disp Refills   ??? pantoprazole (PROTONIX) 40 MG tablet 90 tablet 1     Sig: Take 1 tablet (40 mg total) by mouth Two (2) times a day.       Recent Visits  Date Type Provider Dept   05/17/21 Office Visit Desmond Dike, NP [facility]   05/02/21 Office Visit Deneise Lever, FNP [facility] [facility]   03/13/21 Office Visit Desmond Dike, NP [place] [facility]   11/27/20 Office Visit Desmond Dike, NP [place] [facility]   08/22/20 Office Visit Desmond Dike, NP [facility]   Showing recent visits within past 365 days with a meds authorizing provider and meeting all other requirements  Future Appointments  Date Type Provider Dept   09/12/21 Appointment Desmond Dike, NP [facility]   Showing future appointments within next 365 days with a meds authorizing provider and meeting all other requirements       Labs: Not applicable this refill

## 2021-05-31 MED FILL — COSENTYX PEN 300 MG/2 PENS (150 MG/ML) SUBCUTANEOUS: 28 days supply | Qty: 2 | Fill #2

## 2021-05-31 MED FILL — LANTUS U-100 INSULIN 100 UNIT/ML SUBCUTANEOUS SOLUTION: SUBCUTANEOUS | 15 days supply | Qty: 10 | Fill #3

## 2021-06-04 MED FILL — TRULICITY 1.5 MG/0.5 ML SUBCUTANEOUS PEN INJECTOR: SUBCUTANEOUS | 28 days supply | Qty: 4 | Fill #1

## 2021-06-05 ENCOUNTER — Other Ambulatory Visit: Payer: Self-pay

## 2021-06-05 MED ORDER — AMITRIPTYLINE 10 MG TABLET
0 days
Start: 2021-06-05 — End: ?

## 2021-06-05 MED ORDER — AMITRIPTYLINE HCL 10 MG PO TABS
ORAL_TABLET | ORAL | 2 refills | Status: DC
  Filled 2021-06-05: qty 30, 30d supply, fill #0

## 2021-06-05 MED ORDER — CITALOPRAM HYDROBROMIDE 40 MG PO TABS
ORAL_TABLET | ORAL | 2 refills | Status: DC
  Filled 2021-06-05 – 2021-06-29 (×2): qty 30, 30d supply, fill #0

## 2021-06-05 MED ORDER — REXULTI 2 MG PO TABS
ORAL_TABLET | ORAL | 2 refills | Status: DC
  Filled 2021-06-29: qty 30, 30d supply, fill #0

## 2021-06-11 ENCOUNTER — Ambulatory Visit: Admit: 2021-06-11 | Discharge: 2021-06-12 | Payer: MEDICAID | Attending: Family | Primary: Family

## 2021-06-11 MED ADMIN — triamcinolone acetonide (KENALOG) injection 20 mg: 20 mg | INTRA_ARTICULAR | @ 15:00:00 | Stop: 2021-06-11

## 2021-06-11 NOTE — Unmapped (Addendum)
Thank you for choosing [facility]!  We appreciate the opportunity to participate in your care. Please let us know if we can be of assistance your orthopaedic issues in the future.      If you have questions or concerns, please do not hesitate to contact us by [facility] or by calling [phone] to speak with one of our clinical support sports team members.      [place] MyChart Website: [email or web address]        Appointment Scheduling: [phone]     Walk-in hours:          [facility]:  Monday - Friday 8 am - 5 pm  [address]  [address] Floor, [address]  [place], [place]  [address]        __________________________________________________________________________     Patient Specific Information: Ice or heat and previously prescribed medicine as needed for pain.  Activity as you tolerate with limitations for pain.   Bracing with walking.  You do not need to wear the brace at rest.  Please contact me with any questions or concerns.        You received a corticosteroid injection to reduce pain and inflammation.  Please note that it can take up to 2 weeks for this injection to fully work.  While many people will feel relief sooner, please be patient.      The injection contained a corticosteroid and a numbing agent.  The numbing agent can last for 1-6 hours.  After this wears off you may have increased pain until the steroid has a chance to work.    What are some of the possible side effects of a steroid injection?    Common side effects:  temporarily elevated blood sugar (in diabetic patients) that can last a few days   flushing of the skin, especially the face  temporary rise in blood pressure  discoloration or atrophy of the skin at the injection site    Call your doctor at once if you have:  persistent worsening pain or swelling, fever;  blurred vision, tunnel vision, eye pain, or seeing halos around lights;  fast or slow heartbeats;  increased blood pressure that is associated with severe headache, blurred vision, pounding in your neck or ears, anxiety, nosebleed;  headaches, ringing in your ears, dizziness, nausea, vision problems, pain behind your eyes    This is not a complete list of side effects and others may occur. Call your provider for medical advice about side effects.     What other drugs may be affected after the injection?  Many drugs can interact with steroids. Not all possible interactions are listed here. Tell your doctor about all your current medicines and any you start or stop using, especially:  an antibiotic or antifungal medication;  birth control pills or hormone replacement therapy;  a blood thinner (warfarin, Coumadin, and others);  a diuretic or water pill;  insulin or oral diabetes medicine;  medicine to treat tuberculosis;  a nonsteroidal anti-inflammatory drug or NSAID (aspirin, ibuprofen, naproxen, diclofenac, indomethacin, Advil, Aleve, Celebrex, and many others); or  seizure medication.      You can resume your normal daily activities, but consider resting the injected area for the next few days.         __________________________________________________________________________

## 2021-06-11 NOTE — Unmapped (Signed)
[facility]  Date: 06/11/2021     Primary Care Physician: Olena Leatherwood, NP          ASSESSMENT:    ICD-10-CM   1. Arthritis of left knee  M17.12   2. Chronic pain of left knee  M25.562    G89.29       PLAN:  She has tricompartmental arthritis that is severe in the medial compartment, we will try medial unloader brace wear with activity.  She is interested in an intra-articular steroid injection.  BMI would need to be under 48 to be considered for total knee replacement although she is still young for knee replacement and we would like to maximize all conservative treatments first.  We will proceed with the following treatment plan:  1. Medications: OTC Analgesia  2. DME/Cast:   DME Documentation: Knee Orthotic  The primary encounter diagnosis was Arthritis of left knee. A diagnosis of Chronic pain of left knee was also pertinent to this visit.. For the left knee, the patient was prescribed a Single Medial Unloader; the patient is ambulatory and has knee instability due to?? osteoarthritis.??Instability- The McMurray test was used to determine knee instability. .      3. PT/OT: Home Exercise Program  4. Injections:   Procedure Note:  Knee Injection    Consent   After discussing the various treatment options for the condition, It was agreed that a corticosteroid injection would be the next step in treatment. The nature of and the indications for a corticosteroid and / or local anaesthetic injection were reviewed in detail with the patient today. The inherent risks of injection including infection, bleeding, allergic reaction, increased pain, incomplete relief or temporary relief of symptoms, alterations of blood glucose levels requiring careful monitoring and treatment as indicated, tendon, ligament or articular cartilage rupture or degeneration, nerve injury, skin depigmentation, and/or fatty atrophy were discussed.   Procedure   After the risks and benefits of the procedure were explained,verbal consent was given, and a procedural time-out was performed. The knee joint was palpated and the correct anatomical landmarks were were used to identify the knee joint. The site for the injection was properly marked and prepped with Chlorhexadine solution.  The injection site was anesthetized with ethyl chloride. The knee joint was injected with 20 milligrams of Kenalog, ,and 2 cc of 0.5% Ropivicaine 3cc sterile saline using a sterile technique and a 22 gauge 1.5 inch needle. During the injection, there was unrestricted flow and care was taken not to inject corticosteroid into the skin or subcutaneous tissues. There were no complications during the procedure.      Post procedure a sterile band-aide was applied. Post-injection instructions were given regarding post-procedure care, when to follow up in clinic and what to expect from the procedure. The patient tolerated the injection well and was discharged without complication.        Scheduling Notes:  Return if symptoms worsen or fail to improve.    - X-rays to be ordered at next visit: None     Requested Prescriptions      No prescriptions requested or ordered in this encounter        Orders Placed This Encounter   Procedures   ??? Orthopedics DME Order       SUBJECTIVE:  Chief Complaint: Left knee pain  History of Present Illness:   Ashley Briggs is a 55 y.o. female who presents for left knee pain she was noted to have severe medial compartment predominant  arthritis on first visit to the clinic but did not have good control blood sugar at that time.  Her blood sugar control is better and she would like to proceed with a steroid injection today.  Pain Score: Pain Assessment: 0-10  0-10 Pain Scale: 7  Pain Location: Knee  Pain Orientation: Left (as reported on intake)    ROS:   Negative ROS: no Numbness/tingling.    Positive ROS: (!) Joint pain  Pertinent positives and negatives are documented in the HPI. All other systems reviewed are negative. Patient was instructed to follow-up with the appropriate provider as necessary for all pertinent positives not related to today's encounter.    Medical History:  Past Medical History:   Diagnosis Date   ??? Anxiety    ??? Blepharitis    ??? Cancer (CMS-HCC)     endometrial 2007   ??? Diabetes mellitus (CMS-HCC)    ??? Diabetic retinopathy (CMS-HCC)     Mild NPDR w/ME, left eye   ??? Gastroparesis    ??? Heart disease    ??? High cholesterol    ??? Hypertension    ??? Liver disease    ??? Non-alcoholic fatty liver disease    ??? 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 Psoriasis    ??? Ptosis of eyelid, left     Left upper eyelid   ??? Reflux        Surgical History:  Past Surgical History:   Procedure Laterality Date   ??? CHOLECYSTECTOMY     ??? HYSTERECTOMY      endometrial cancer   ??? HYSTERECTOMY     ??? PR ANAL PRESSURE RECORD Left 03/31/2013    Procedure: ANORECTAL MANOMETRY;  Surgeon: None None;  [facility];  Service: Gastroenterology   ??? PR BREATH HYDROGEN TEST N/A 03/31/2013    Procedure: BREATH HYDROGEN TEST;  Surgeon: None None;  [facility];  Service: Gastroenterology   ??? PR COLSC FLX W/RMVL OF TUMOR POLYP LESION SNARE TQ N/A 09/06/2020    Procedure: COLONOSCOPY FLEX; W/REMOV TUMOR/LES BY SNARE;  Surgeon: Chriss Driver, MD;  [facility]: [facility];  Service: Gastroenterology   ??? PR UPPER GI ENDOSCOPY,BIOPSY N/A 04/15/2013    Procedure: UGI ENDOSCOPY; WITH BIOPSY, SINGLE OR MULTIPLE;  Surgeon: Vickii Chafe, MD;  [facility]: [facility];  Service: Gastroenterology   ??? PR UPPER GI ENDOSCOPY,BIOPSY N/A 09/06/2020    Procedure: UGI ENDOSCOPY; WITH BIOPSY, SINGLE OR MULTIPLE;  Surgeon: Chriss Driver, MD;  [facility]: [facility];  Service: Gastroenterology       Medications:  ??? amitriptyline (ELAVIL) 10 MG tablet TAKE ONE TABLET BY MOUTH ONCE DAILY AT BEDTIME.   ??? albuterol HFA 90 mcg/actuation inhaler Inhale 2 puffs every six (6) hours as needed for shortness of breath.   ??? aspirin (ECOTRIN) 81 MG tablet Take 81 mg by mouth daily.   ??? benzonatate (TESSALON PERLES) 100 MG capsule Take 1-2 capsules (100-200 mg total) by mouth Three (3) times a day as needed for cough.   ??? blood sugar diagnostic (ON CALL EXPRESS TEST STRIP) Strp Use to check blood sugar Three (3) times a day.   ??? blood-glucose meter Misc Use as directed by provider.   ??? brexpiprazole (REXULTI) 2 mg Tab Take 2 mg by mouth daily.   ??? canagliflozin (INVOKANA) 300 mg Tab tablet Take 1 tablet (300 mg total) by mouth every morning  before breakfast.   ??? celecoxib (CELEBREX) 200 MG capsule Take 1 capsule (200 mg total) by mouth Two (2) times a day as needed for pain   ??? citalopram (CELEXA) 40 MG tablet Take 40 mg by mouth daily.   ??? clonazePAM (KLONOPIN) 0.5 MG tablet Take 0.5 mg by mouth daily.   ??? dulaglutide 1.5 mg/0.5 mL PnIj Inject the contents of 2 syringes (3 mg total) under the skin every seven (7) days.   ??? HYDROcodone-chlorpheniramine polistirex (TUSSIONEX PENNKINETIC) 10-8 mg/5 mL ER suspension Take 5 mL by mouth every twelve (12) hours as needed for cough.   ??? insulin glargine (LANTUS U-100 INSULIN) 100 unit/mL injection Inject 0.66 mL (66 Units total) under the skin nightly.   ??? lancets 30 gauge Misc Use as directed to test once daily.   ??? lisinopriL (PRINIVIL,ZESTRIL) 40 MG tablet Take 1 tablet (40 mg total) by mouth in the morning.   ??? metFORMIN (GLUCOPHAGE-XR) 500 MG 24 hr tablet Take 2 tablets (1,000 mg total) by mouth in the morning and 2 tablets (1,000 mg total) in the evening. Take with meals.   ??? pantoprazole (PROTONIX) 40 MG tablet Take 1 tablet (40 mg total) by mouth Two (2) times a day.   ??? secukinumab (COSENTYX PEN, 2 PENS,) 150 mg/mL PnIj injection Inject the contents of 2 pens (300 mg) under the skin every 4 weeks .   ??? simvastatin (ZOCOR) 20 MG tablet Take 1 tablet (20 mg total) by mouth every evening.   ??? tolterodine (DETROL LA) 4 MG 24 hr capsule Take 1 capsule (4 mg total) by mouth daily.       Allergies:  Patient has no known allergies.    Social History:  Social History     Socioeconomic History   ??? Marital status: Widowed   Tobacco Use   ??? Smoking status: Never Smoker   ??? Smokeless tobacco: Never Used   Vaping Use   ??? Vaping Use: Never used   Substance and Sexual Activity   ??? Alcohol use: Not Currently     Comment: 2 drinks monthly   ??? Drug use: Never   Other Topics Concern   ??? Do you use sunscreen? No   ??? Tanning bed use? No   ??? Are you easily burned? Yes   ??? Excessive sun exposure? No   ??? Blistering sunburns? Yes   Social History Narrative    ** Merged History Encounter **          Social Determinants of Health     Financial Resource Strain: High Risk   ??? Difficulty of Paying Living Expenses: Hard   Food Insecurity: No Food Insecurity   ??? Worried About Programme researcher, broadcasting/film/video in the Last Year: Never true   ??? Ran Out of Food in the Last Year: Never true   Transportation Needs: No Transportation Needs   ??? Lack of Transportation (Medical): No   ??? Lack of Transportation (Non-Medical): No   Physical Activity: Insufficiently Active   ??? Days of Exercise per Week: 1 day   ??? Minutes of Exercise per Session: 40 min       Family History:  Family History   Problem Relation Age of Onset   ??? Skin cancer Paternal Grandfather    ??? Diabetes Mother    ??? Cancer Mother    ??? Diabetes Maternal Grandmother    ??? Hypertension Maternal Grandmother    ??? Cancer Maternal Grandfather    ??? Diabetes Maternal Grandfather    ???  No Known Problems Father    ??? No Known Problems Sister    ??? No Known Problems Brother    ??? No Known Problems Maternal Aunt    ??? No Known Problems Maternal Uncle    ??? No Known Problems Paternal Aunt    ??? No Known Problems Paternal Uncle    ??? No Known Problems Paternal Grandmother    ??? No Known Problems Other    ??? Melanoma Neg Hx    ??? Basal cell carcinoma Neg Hx    ??? Squamous cell carcinoma Neg Hx    ??? Substance Abuse Disorder Neg Hx    ??? Alcohol abuse Neg Hx    ??? Drug abuse Neg Hx    ??? Mental illness Neg Hx    ??? Amblyopia Neg Hx    ??? Blindness Neg Hx    ??? Cataracts Neg Hx    ??? Glaucoma Neg Hx    ??? Macular degeneration Neg Hx    ??? Retinal detachment Neg Hx    ??? Strabismus Neg Hx    ??? Stroke Neg Hx    ??? Thyroid disease Neg Hx          OBJECTIVE:  DETAILED PHYSICAL EXAM   General Appearance ?? well-nourished, in no acute distress.  Estimated body mass index is 45.49 kg/m?? as calculated from the following:    Height as of 05/17/21: 162.6 cm (5' 4).    Weight as of 05/17/21: 120.2 kg (265 lb).   Mood and Affect ?? alert, cooperative and pleasant.   Gait  ?? antalgic to the left   Cardiovascular ?? well-perfused distally and no swelling.   Sensation ?? sensation to light touch distally normal      Left knee without obvious deformity.  Range of motion 0 to 110 degrees.  Tenderness palpation about medial and lateral joint lines.  Stable to valgus and varus stress.  Positive McMurray.  Skin is warm, dry and intact.    Test Results  Imaging:  No new x-rays taken today.    *This note was created using Scientist, clinical (histocompatibility and immunogenetics). Errors may persist despite proofreading.       Ivor Reining, FNP

## 2021-06-13 MED FILL — CELECOXIB 200 MG CAPSULE: ORAL | 30 days supply | Qty: 60 | Fill #3

## 2021-06-14 ENCOUNTER — Other Ambulatory Visit: Payer: Self-pay

## 2021-06-17 ENCOUNTER — Ambulatory Visit: Admit: 2021-06-17 | Discharge: 2021-06-18 | Payer: MEDICAID

## 2021-06-17 DIAGNOSIS — E113212 Type 2 diabetes mellitus with mild nonproliferative diabetic retinopathy with macular edema, left eye: Principal | ICD-10-CM

## 2021-06-17 DIAGNOSIS — Z794 Long term (current) use of insulin: Principal | ICD-10-CM

## 2021-06-17 DIAGNOSIS — E113299 Type 2 diabetes mellitus with mild nonproliferative diabetic retinopathy without macular edema, unspecified eye: Principal | ICD-10-CM

## 2021-06-17 DIAGNOSIS — H02402 Unspecified ptosis of left eyelid: Secondary | ICD-10-CM | POA: Insufficient documentation

## 2021-06-17 DIAGNOSIS — E119 Type 2 diabetes mellitus without complications: Secondary | ICD-10-CM | POA: Insufficient documentation

## 2021-06-17 NOTE — Unmapped (Signed)
[facility]    Initial History of present illness:   55-year-old female referred from Dr. Friedland for evaluation of diabetic retinopathy.  At her visit 12/11/2020 patient was noted to have macular edema in the left eye more than the right.    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     Date Medication Interval (w) Comments   01/14/2021   Avastin     04/08/2021  12 exam   05/08/2021  4                          Follow up 4 weeks OCT, Avastin Left, no dilation      # Blepharitis both eyes   Followed by Dr. Friedland  Continue lubrication and lid hygiene    # Ptosis Left Upper Lid  Patient has droopy LUL that isn't bothering her vision.  Observe

## 2021-06-20 MED FILL — INVOKANA 300 MG TABLET: ORAL | 30 days supply | Qty: 30 | Fill #4

## 2021-06-20 MED FILL — PROMETHAZINE 25 MG TABLET: ORAL | 30 days supply | Qty: 30 | Fill #1

## 2021-06-20 MED FILL — LANTUS U-100 INSULIN 100 UNIT/ML SUBCUTANEOUS SOLUTION: SUBCUTANEOUS | 15 days supply | Qty: 10 | Fill #4

## 2021-06-24 NOTE — Unmapped (Signed)
Attempted to call patient at [phone] to reschedule cancelled appointment with Dr. Amalia Hailey. No answer and unable to leave message due to full voicemail box.

## 2021-06-25 NOTE — Unmapped (Signed)
[facility] Refill Coordination Note    Specialty Medication(s) to be Shipped:   Inflammatory Disorders: Cosentyx    Other medication(s) to be shipped: No additional medications requested for fill at this time     Ashley Briggs, DOB: [birth year]  Phone: [phone] (home)       All above HIPAA information was verified with patient.     Was a Nurse, learning disability used for this call? No    Completed refill call assessment today to schedule patient's medication shipment from the [facility] [phone]).  All relevant notes have been reviewed.     Specialty medication(s) and dose(s) confirmed: Regimen is correct and unchanged.   Changes to medications: Ashley Briggs reports no changes at this time.  Changes to insurance: No  New side effects reported not previously addressed with a pharmacist or physician: None reported  Questions for the pharmacist: No    Confirmed patient received a Conservation officer, historic buildings and a Surveyor, mining with first shipment. The patient will receive a drug information handout for each medication shipped and additional FDA Medication Guides as required.       DISEASE/MEDICATION-SPECIFIC INFORMATION        For patients on injectable medications: Patient currently has 0 doses left.  Next injection is scheduled for 8/4.    SPECIALTY MEDICATION ADHERENCE     Medication Adherence    Patient reported X missed doses in the last month: 0  Specialty Medication: COSENTYX PEN (2 PENS) 150 mg/mL  Patient is on additional specialty medications: No  Informant: patient  Confirmed plan for next specialty medication refill: delivery by pharmacy  Refills needed for supportive medications: not needed          Refill Coordination    Has the Patients' Contact Information Changed: No  Is the Shipping Address Different: No         Were doses missed due to medication being on hold? No    COSENTYX 150 mg/ml: 9 days of medicine on hand        REFERRAL TO PHARMACIST     Referral to the pharmacist: Not needed      [facility]     Shipping address confirmed in Epic.     Delivery Scheduled: Yes, Expected medication delivery date: 7/29.     Medication will be delivered via UPS to the prescription address in Epic WAM.    Jolene Schimke   [facility] Specialty Technician

## 2021-06-27 MED FILL — COSENTYX PEN 300 MG/2 PENS (150 MG/ML) SUBCUTANEOUS: 28 days supply | Qty: 2 | Fill #3

## 2021-07-01 ENCOUNTER — Other Ambulatory Visit: Payer: Self-pay

## 2021-07-03 ENCOUNTER — Other Ambulatory Visit: Payer: Self-pay

## 2021-07-03 MED ORDER — CITALOPRAM HYDROBROMIDE 40 MG PO TABS
ORAL_TABLET | ORAL | 0 refills | Status: DC
  Filled 2021-07-03 – 2021-08-01 (×2): qty 30, 30d supply, fill #0

## 2021-07-03 MED ORDER — REXULTI 2 MG PO TABS
ORAL_TABLET | ORAL | 0 refills | Status: DC
  Filled 2021-07-03 – 2021-07-31 (×2): qty 30, 30d supply, fill #0

## 2021-07-03 MED ORDER — AMITRIPTYLINE HCL 10 MG PO TABS
ORAL_TABLET | ORAL | 0 refills | Status: DC
  Filled 2021-07-03 – 2021-07-08 (×2): qty 30, 30d supply, fill #0

## 2021-07-04 ENCOUNTER — Other Ambulatory Visit: Payer: Self-pay

## 2021-07-08 ENCOUNTER — Other Ambulatory Visit: Payer: Self-pay

## 2021-07-09 MED FILL — TRULICITY 1.5 MG/0.5 ML SUBCUTANEOUS PEN INJECTOR: SUBCUTANEOUS | 28 days supply | Qty: 4 | Fill #2

## 2021-07-12 ENCOUNTER — Institutional Professional Consult (permissible substitution): Admit: 2021-07-12 | Discharge: 2021-07-13 | Payer: MEDICAID | Attending: Family | Primary: Family

## 2021-07-12 DIAGNOSIS — U071 COVID-19: Principal | ICD-10-CM

## 2021-07-12 MED ORDER — CELECOXIB 200 MG CAPSULE
ORAL_CAPSULE | Freq: Two times a day (BID) | ORAL | 1 refills | 30 days | Status: CP
Start: 2021-07-12 — End: 2021-08-11

## 2021-07-12 MED ORDER — MOLNUPIRAVIR 200 MG CAPSULE (EUA)
ORAL_CAPSULE | Freq: Two times a day (BID) | ORAL | 0 refills | 5.00000 days | Status: CP
Start: 2021-07-12 — End: 2021-07-17

## 2021-07-12 MED ORDER — BENZONATATE 100 MG CAPSULE
ORAL_CAPSULE | Freq: Three times a day (TID) | ORAL | 1 refills | 7 days | Status: CP | PRN
Start: 2021-07-12 — End: 2022-07-12

## 2021-07-12 MED ORDER — ALBUTEROL SULFATE HFA 90 MCG/ACTUATION AEROSOL INHALER
Freq: Four times a day (QID) | RESPIRATORY_TRACT | 1 refills | 0 days | Status: CP | PRN
Start: 2021-07-12 — End: 2022-07-12

## 2021-07-12 MED FILL — INVOKANA 300 MG TABLET: ORAL | 30 days supply | Qty: 30 | Fill #5

## 2021-07-12 NOTE — Unmapped (Signed)
Patient is requesting the following refill  Requested Prescriptions     Pending Prescriptions Disp Refills   ??? celecoxib (CELEBREX) 200 MG capsule 60 capsule 3     Sig: Take 1 capsule (200 mg total) by mouth Two (2) times a day. As needed for pain       Recent Visits  Date Type Provider Dept   05/17/21 Office Visit Desmond Dike, NP [facility]   05/02/21 Office Visit Deneise Lever, FNP [facility] [facility]   03/13/21 Office Visit Desmond Dike, NP [facility]   11/27/20 Office Visit Desmond Dike, NP [facility]   08/22/20 Office Visit Desmond Dike, NP [facility] [facility]   Showing recent visits within past 365 days with a meds authorizing provider and meeting all other requirements  Future Appointments  Date Type Provider Dept   09/12/21 Appointment Desmond Dike, NP [facility]   Showing future appointments within next 365 days with a meds authorizing provider and meeting all other requirements       Labs:?

## 2021-07-12 NOTE — Unmapped (Signed)
[facility] Care at [facility] Telephonic/Telehealth noteDan Humphreys, [place] [address]. Phone [phone]    Date of Service:  07/12/2021    Scot Dock    Date of Birth:  [birth year]    Chief Complaint   Patient presents with   ??? covid 19 virus     Sore throat, chest congestion, body aches, very low grade fever for two days       I spent 12 minutes on the phone with the patient. I spent an additional 10 minutes on pre- and post-visit activities.     The patient was physically located in [place] or a state in which I am permitted to provide care. The visit was reasonable and appropriate under the circumstances given the patient's presentation at the time.    She has been advised of the potential risks and limitations of this mode of treatment (including, but not limited to, the absence of in-person examination) and has agreed to be treated using telemedicine. The patient's/patient's family's questions regarding telemedicine have been answered.     She has been advised to contact their provider???s office for worsening conditions, and seek emergency medical treatment and/or call 911 if the patient deems either necessary.      Assessment/Plan:     Diagnosis ICD-10-CM Associated Orders    COVID-19  U07.1 MOLNUPIRAVIR, EUA, 200 mg capsule     benzonatate (TESSALON PERLES) 100 MG capsule     albuterol HFA 90 mcg/actuation inhaler          HPI:  Ashley Briggs is a 55 y.o. female for evaluation of Covid-19 infection. States testing positive for Covid-19 yesterday 07/11/21 and was exposed to her cousin who also tested positive, they live in the same house. Patient's symptoms include congestion. Associated symptoms include fever, headache on 07/11/21, productive cough with sputum described as yellow, sore throat and chest pressure, diarrhea. The patient has been suffering from these symptoms for approximately 1 day. Symptoms have been gradual worsening since their onset. Medications used in the past to treat these symptoms include no prescription medications. Suspected precipitants include Covid-19. Patient is awoken from sleep approximately none times per night. Patient has not required emergency room treatment for these symptoms, and has not required hospitalization. The patient has not been intubated in the past.     Denies HA, chest pain, N/V/D, bowel or bladder issues, or swelling.     ROS: 12 systems reviewed, no other listed complaints.    Pertinent Past Med Hx:    Past Medical History:   Diagnosis Date   ??? Anxiety    ??? Blepharitis    ??? Cancer (CMS-HCC)     endometrial 2007   ??? Diabetes mellitus (CMS-HCC)    ??? Diabetic retinopathy (CMS-HCC)     Mild NPDR w/ME, left eye   ??? Gastroparesis    ??? Heart disease    ??? High cholesterol    ??? Hypertension    ??? Liver disease    ??? Non-alcoholic fatty liver disease    ??? 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 Psoriasis    ??? Ptosis of eyelid, left     Left upper eyelid   ??? Reflux        Medications:     Current Outpatient Medications:   ???  amitriptyline (ELAVIL) 10 MG tablet, TAKE ONE TABLET BY MOUTH ONCE DAILY AT BEDTIME., Disp: , Rfl:   ???  aspirin (ECOTRIN) 81 MG tablet,  Take 81 mg by mouth daily., Disp: , Rfl:   ???  brexpiprazole (REXULTI) 2 mg Tab, Take 2 mg by mouth daily., Disp: , Rfl:   ???  canagliflozin (INVOKANA) 300 mg Tab tablet, Take 1 tablet (300 mg total) by mouth every morning before breakfast., Disp: 90 tablet, Rfl: 3  ???  celecoxib (CELEBREX) 200 MG capsule, Take 1 capsule (200 mg total) by mouth Two (2) times a day. As needed for pain, Disp: 60 capsule, Rfl: 3  ???  citalopram (CELEXA) 40 MG tablet, Take 40 mg by mouth daily., Disp: , Rfl:   ???  clonazePAM (KLONOPIN) 0.5 MG tablet, Take 0.5 mg by mouth daily., Disp: , Rfl:   ???  dulaglutide 1.5 mg/0.5 mL PnIj, Inject the contents of 2 syringes (3 mg total) under the skin every seven (7) days., Disp: 4 mL, Rfl: 6  ???  insulin glargine (LANTUS U-100 INSULIN) 100 unit/mL injection, Inject 0.66 mL (66 Units total) under the skin nightly., Disp: 10 mL, Rfl: 4  ???  lisinopriL (PRINIVIL,ZESTRIL) 40 MG tablet, Take 1 tablet (40 mg total) by mouth in the morning., Disp: 90 tablet, Rfl: 1  ???  metFORMIN (GLUCOPHAGE-XR) 500 MG 24 hr tablet, Take 2 tablets (1,000 mg total) by mouth in the morning and 2 tablets (1,000 mg total) in the evening. Take with meals., Disp: 360 tablet, Rfl: 1  ???  pantoprazole (PROTONIX) 40 MG tablet, Take 1 tablet (40 mg total) by mouth Two (2) times a day., Disp: 180 tablet, Rfl: 1  ???  promethazine (PHENERGAN) 25 MG tablet, Take 1 tablet (25 mg total) by mouth at bedtime for 14 days. May repeat during day as needed. Medication for nausea., Disp: 30 tablet, Rfl: 1  ???  secukinumab (COSENTYX PEN, 2 PENS,) 150 mg/mL PnIj injection, Inject the contents of 2 pens (300 mg) under the skin every 4 weeks ., Disp: 2 mL, Rfl: 12  ???  simvastatin (ZOCOR) 20 MG tablet, Take 1 tablet (20 mg total) by mouth every evening., Disp: 90 tablet, Rfl: 1  ???  tolterodine (DETROL LA) 4 MG 24 hr capsule, Take 1 capsule (4 mg total) by mouth daily., Disp: 90 capsule, Rfl: 2  ???  albuterol HFA 90 mcg/actuation inhaler, Inhale 2 puffs every six (6) hours as needed for shortness of breath., Disp: 8 g, Rfl: 1  ???  benzonatate (TESSALON PERLES) 100 MG capsule, Take 1-2 capsules (100-200 mg total) by mouth Three (3) times a day as needed for cough., Disp: 40 capsule, Rfl: 1  ???  blood sugar diagnostic (ON CALL EXPRESS TEST STRIP) Strp, Use to check blood sugar Three (3) times a day., Disp: 300 each, Rfl: 1  ???  blood-glucose meter Misc, Use as directed by provider., Disp: 1 each, Rfl: 0  ???  cephalexin (KEFLEX) 500 MG capsule, TAKE 1 CAPSULE BY MOUTH 4 TIMES DAILY FOR 5 DAYS (Patient not taking: Reported on 07/12/2021), Disp: , Rfl:   ???  lancets 30 gauge Misc, Use as directed to test once daily., Disp: 100 each, Rfl: 3  ???  MOLNUPIRAVIR, EUA, 200 mg capsule, Take 4 capsules (800 mg total) by mouth every twelve (12) hours for 5 days., Disp: 40 capsule, Rfl: 0     Allergies:   No Known Allergies    Diag:    Lab Results   Component Value Date    WBC 3.2 (L) 03/13/2021    HGB 10.1 (A) 05/02/2021    HCT 31.0 (L) 03/13/2021  PLT 123 (L) 03/13/2021       Lab Results   Component Value Date    NA 136 03/13/2021    K 4.4 03/13/2021    CL 103 03/13/2021    CO2 23.0 03/13/2021    BUN 12 03/13/2021    CREATININE 0.73 03/13/2021    GLU 267 (H) 03/13/2021    CALCIUM 9.8 03/13/2021       Lab Results   Component Value Date    BILITOT 1.0 03/13/2021    PROT 7.6 03/13/2021    ALBUMIN 3.3 (L) 03/13/2021    ALT 45 03/13/2021    AST 69 (H) 03/13/2021    ALKPHOS 137 (H) 03/13/2021       Lab Results   Component Value Date    PT 12.2 01/31/2011    INR 1.1 01/31/2011    APTT 30.7 01/31/2011              I attest that I, Queen Blossom, personally documented this note while acting as scribe for Betha Loa, NP    Queen Blossom, Scribe.    The documentation recorded by the scribe accurately reflects the service I personally performed and the decisions made by me.    Dr. Betha Loa, DNP, FNP-BC  Board Certified Doctor of Nursing Practice   [phone]

## 2021-07-16 DIAGNOSIS — R059 Cough: Principal | ICD-10-CM

## 2021-07-16 DIAGNOSIS — E1169 Type 2 diabetes mellitus with other specified complication: Principal | ICD-10-CM

## 2021-07-16 DIAGNOSIS — U071 COVID-19: Principal | ICD-10-CM

## 2021-07-16 DIAGNOSIS — Z794 Long term (current) use of insulin: Principal | ICD-10-CM

## 2021-07-16 MED ORDER — CELECOXIB 200 MG CAPSULE
ORAL_CAPSULE | Freq: Two times a day (BID) | ORAL | 3 refills | 30 days | Status: CP
Start: 2021-07-16 — End: 2021-08-15
  Filled 2021-07-15: qty 60, 30d supply, fill #0

## 2021-07-16 MED ORDER — HYDROCODONE 10 MG-CHLORPHENIRAMINE 8 MG/5 ML ORAL SUSP EXTEND.REL 12HR
Freq: Two times a day (BID) | ORAL | 0 refills | 12.00000 days | Status: CP | PRN
Start: 2021-07-16 — End: 2021-07-16

## 2021-07-16 MED ORDER — INSULIN GLARGINE (U-100) 100 UNIT/ML SUBCUTANEOUS SOLUTION
Freq: Every evening | SUBCUTANEOUS | 4 refills | 15.00000 days
Start: 2021-07-16 — End: 2022-07-14

## 2021-07-16 NOTE — Unmapped (Signed)
Ok. Please let her know she can take Tussionex and tessalon at the same time, if needed.

## 2021-07-16 NOTE — Unmapped (Signed)
Patient called to inform us that [facility] pharmacy does not fill this medication.  She would like it to be sent to Ashley Briggs please.

## 2021-07-16 NOTE — Unmapped (Signed)
[facility] Primary Care at [facility] Telephonic/Telehealth noteDan Humphreys, [place] [address]. Phone [phone]    Date of Service:  07/16/2021    Scot Dock    Date of Birth:  01/05/1966    Chief Complaint: COVID-19 induced cough    I spent 5 minutes on the phone with the patient. I spent an additional 10 minutes on pre- and post-visit activities.     Ms. Agro called into the office concerned about COVID and cough. She has been Rx'd molnupiravir and has not completed treatment.    The patient was physically located in [place] or a state in which I am permitted to provide care. The visit was reasonable and appropriate under the circumstances given the patient's presentation at the time.    She has been advised of the potential risks and limitations of this mode of treatment (including, but not limited to, the absence of in-person examination) and has agreed to be treated using telemedicine. The patient's/patient's family's questions regarding telemedicine have been answered.     She has been advised to contact their provider???s office for worsening conditions, and seek emergency medical treatment and/or call 911 if the patient deems either necessary.      Assessment/Plan:     Diagnosis ICD-10-CM Plan/Associated Orders   1. COVID-19  U07.1 Finish molnupiravir 800 mg bid until finished.     2. Cough  R05.9 Add Tussionex 1 tsp q 12 prn  Tessalon has been RX'd, use as directed.            Pertinent Past Med Hx:    Past Medical History:   Diagnosis Date   ??? Anxiety    ??? Blepharitis    ??? Cancer (CMS-HCC)     endometrial 2007   ??? Diabetes mellitus (CMS-HCC)    ??? Diabetic retinopathy (CMS-HCC)     Mild NPDR w/ME, left eye   ??? Gastroparesis    ??? Heart disease    ??? High cholesterol    ??? Hypertension    ??? Liver disease    ??? Non-alcoholic fatty liver disease    ??? 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 Psoriasis    ??? Ptosis of eyelid, left     Left upper eyelid   ??? Reflux        Medications:     Current Outpatient Medications:   ???  albuterol HFA 90 mcg/actuation inhaler, Inhale 2 puffs every six (6) hours as needed for shortness of breath., Disp: 8 g, Rfl: 1  ???  amitriptyline (ELAVIL) 10 MG tablet, TAKE ONE TABLET BY MOUTH ONCE DAILY AT BEDTIME., Disp: , Rfl:   ???  aspirin (ECOTRIN) 81 MG tablet, Take 81 mg by mouth daily., Disp: , Rfl:   ???  benzonatate (TESSALON PERLES) 100 MG capsule, Take 1-2 capsules (100-200 mg total) by mouth Three (3) times a day as needed for cough., Disp: 40 capsule, Rfl: 1  ???  blood sugar diagnostic (ON CALL EXPRESS TEST STRIP) Strp, Use to check blood sugar Three (3) times a day., Disp: 300 each, Rfl: 1  ???  blood-glucose meter Misc, Use as directed by provider., Disp: 1 each, Rfl: 0  ???  brexpiprazole (REXULTI) 2 mg Tab, Take 2 mg by mouth daily., Disp: , Rfl:   ???  canagliflozin (INVOKANA) 300 mg Tab tablet, Take 1 tablet (300 mg total) by mouth every morning before breakfast., Disp: 90 tablet, Rfl: 3  ???  celecoxib (CELEBREX)  200 MG capsule, Take 1 capsule (200 mg total) by mouth Two (2) times a day. As needed for pain, Disp: 60 capsule, Rfl: 1  ???  cephalexin (KEFLEX) 500 MG capsule, TAKE 1 CAPSULE BY MOUTH 4 TIMES DAILY FOR 5 DAYS (Patient not taking: Reported on 07/12/2021), Disp: , Rfl:   ???  citalopram (CELEXA) 40 MG tablet, Take 40 mg by mouth daily., Disp: , Rfl:   ???  clonazePAM (KLONOPIN) 0.5 MG tablet, Take 0.5 mg by mouth daily., Disp: , Rfl:   ???  dulaglutide 1.5 mg/0.5 mL PnIj, Inject the contents of 2 syringes (3 mg total) under the skin every seven (7) days., Disp: 4 mL, Rfl: 6  ???  HYDROcodone-chlorpheniramine polistirex (TUSSIONEX PENNKINETIC) 10-8 mg/5 mL ER suspension, Take 5 mL by mouth every twelve (12) hours as needed for cough., Disp: 115 mL, Rfl: 0  ???  insulin glargine (LANTUS U-100 INSULIN) 100 unit/mL injection, Inject 0.66 mL (66 Units total) under the skin nightly., Disp: 10 mL, Rfl: 4  ???  lancets 30 gauge Misc, Use as directed to test once daily., Disp: 100 each, Rfl: 3  ???  lisinopriL (PRINIVIL,ZESTRIL) 40 MG tablet, Take 1 tablet (40 mg total) by mouth in the morning., Disp: 90 tablet, Rfl: 1  ???  metFORMIN (GLUCOPHAGE-XR) 500 MG 24 hr tablet, Take 2 tablets (1,000 mg total) by mouth in the morning and 2 tablets (1,000 mg total) in the evening. Take with meals., Disp: 360 tablet, Rfl: 1  ???  MOLNUPIRAVIR, EUA, 200 mg capsule, Take 4 capsules (800 mg total) by mouth every twelve (12) hours for 5 days., Disp: 40 capsule, Rfl: 0  ???  pantoprazole (PROTONIX) 40 MG tablet, Take 1 tablet (40 mg total) by mouth Two (2) times a day., Disp: 180 tablet, Rfl: 1  ???  promethazine (PHENERGAN) 25 MG tablet, Take 1 tablet (25 mg total) by mouth at bedtime for 14 days. May repeat during day as needed. Medication for nausea., Disp: 30 tablet, Rfl: 1  ???  secukinumab (COSENTYX PEN, 2 PENS,) 150 mg/mL PnIj injection, Inject the contents of 2 pens (300 mg) under the skin every 4 weeks ., Disp: 2 mL, Rfl: 12  ???  simvastatin (ZOCOR) 20 MG tablet, Take 1 tablet (20 mg total) by mouth every evening., Disp: 90 tablet, Rfl: 1  ???  tolterodine (DETROL LA) 4 MG 24 hr capsule, Take 1 capsule (4 mg total) by mouth daily., Disp: 90 capsule, Rfl: 2     Allergies:   No Known Allergies    Diag:    Lab Results   Component Value Date    WBC 3.2 (L) 03/13/2021    HGB 10.1 (A) 05/02/2021    HCT 31.0 (L) 03/13/2021    PLT 123 (L) 03/13/2021       Lab Results   Component Value Date    NA 136 03/13/2021    K 4.4 03/13/2021    CL 103 03/13/2021    CO2 23.0 03/13/2021    BUN 12 03/13/2021    CREATININE 0.73 03/13/2021    GLU 267 (H) 03/13/2021    CALCIUM 9.8 03/13/2021       Lab Results   Component Value Date    BILITOT 1.0 03/13/2021    PROT 7.6 03/13/2021    ALBUMIN 3.3 (L) 03/13/2021    ALT 45 03/13/2021    AST 69 (H) 03/13/2021    ALKPHOS 137 (H) 03/13/2021       Lab Results  Component Value Date    PT 12.2 01/31/2011    INR 1.1 01/31/2011    APTT 30.7 01/31/2011          Dr. Betha Loa, DNP, FNP-BC  Board Certified Doctor of Nursing Practice   [phone]

## 2021-07-17 NOTE — Unmapped (Signed)
Patient is requesting the following refill  Requested Prescriptions     Pending Prescriptions Disp Refills   ??? insulin glargine (LANTUS U-100 INSULIN) 100 unit/mL injection 10 mL 4     Sig: Inject 0.66 mL (66 Units total) under the skin nightly.       Recent Visits  Date Type Provider Dept   05/17/21 Office Visit Desmond Dike, NP [place] [facility]   05/02/21 Office Visit Deneise Lever, FNP [place] [facility]   03/13/21 Office Visit Desmond Dike, NP [place] [facility]   11/27/20 Office Visit Desmond Dike, NP [place] [facility]   08/22/20 Office Visit Desmond Dike, NP [place] [facility]   Showing recent visits within past 365 days with a meds authorizing provider and meeting all other requirements  Future Appointments  Date Type Provider Dept   09/12/21 Appointment Desmond Dike, NP  [facility] At [facility]   Showing future appointments within next 365 days with a meds authorizing provider and meeting all other requirements       Labs:   A1c:   Hemoglobin A1C (%)   Date Value   04/10/2021 8.9 (A)

## 2021-07-18 MED FILL — LANTUS U-100 INSULIN 100 UNIT/ML SUBCUTANEOUS SOLUTION: SUBCUTANEOUS | 15 days supply | Qty: 10 | Fill #0

## 2021-07-20 ENCOUNTER — Emergency Department
Admission: EM | Admit: 2021-07-20 | Discharge: 2021-07-21 | Disposition: A | Discharge status: Home / Self Care | Payer: Medicaid Other | Attending: Emergency Medicine | Admitting: Emergency Medicine

## 2021-07-20 ENCOUNTER — Encounter: Payer: Self-pay | Admitting: Radiology

## 2021-07-20 ENCOUNTER — Other Ambulatory Visit: Payer: Self-pay

## 2021-07-20 ENCOUNTER — Emergency Department: Payer: Medicaid Other

## 2021-07-20 DIAGNOSIS — Z79899 Other long term (current) drug therapy: Secondary | ICD-10-CM | POA: Insufficient documentation

## 2021-07-20 DIAGNOSIS — R0602 Shortness of breath: Secondary | ICD-10-CM

## 2021-07-20 DIAGNOSIS — E119 Type 2 diabetes mellitus without complications: Secondary | ICD-10-CM | POA: Insufficient documentation

## 2021-07-20 DIAGNOSIS — Z794 Long term (current) use of insulin: Secondary | ICD-10-CM | POA: Diagnosis not present

## 2021-07-20 DIAGNOSIS — Z8542 Personal history of malignant neoplasm of other parts of uterus: Secondary | ICD-10-CM | POA: Diagnosis not present

## 2021-07-20 DIAGNOSIS — Z7982 Long term (current) use of aspirin: Secondary | ICD-10-CM | POA: Insufficient documentation

## 2021-07-20 DIAGNOSIS — Z7984 Long term (current) use of oral hypoglycemic drugs: Secondary | ICD-10-CM | POA: Diagnosis not present

## 2021-07-20 DIAGNOSIS — U071 COVID-19: Secondary | ICD-10-CM | POA: Insufficient documentation

## 2021-07-20 DIAGNOSIS — I10 Essential (primary) hypertension: Secondary | ICD-10-CM | POA: Insufficient documentation

## 2021-07-20 DIAGNOSIS — R059 Cough, unspecified: Secondary | ICD-10-CM

## 2021-07-20 LAB — BASIC METABOLIC PANEL
Anion gap: 9 (ref 5–15)
BUN: 11 mg/dL (ref 6–20)
CO2: 26 mmol/L (ref 22–32)
Calcium: 9 mg/dL (ref 8.9–10.3)
Chloride: 100 mmol/L (ref 98–111)
Creatinine, Ser: 0.77 mg/dL (ref 0.44–1.00)
GFR, Estimated: >60 mL/min (ref >60)
Glucose, Bld: 203 mg/dL — ABNORMAL HIGH (ref 70–99)
Potassium: 4.2 mmol/L (ref 3.5–5.1)
Sodium: 135 mmol/L (ref 135–145)

## 2021-07-20 LAB — CBC
HCT: 34.8 % — ABNORMAL LOW (ref 36.0–46.0)
Hemoglobin: 10.6 g/dL — ABNORMAL LOW (ref 12.0–15.0)
MCH: 23.4 pg — ABNORMAL LOW (ref 26.0–34.0)
MCHC: 30.5 g/dL (ref 30.0–36.0)
MCV: 76.8 fL — ABNORMAL LOW (ref 80.0–100.0)
Platelets: 175 10*3/uL (ref 150–400)
RBC: 4.53 MIL/uL (ref 3.87–5.11)
RDW: 17.5 % — ABNORMAL HIGH (ref 11.5–15.5)
WBC: 4.8 10*3/uL (ref 4.0–10.5)
nRBC: 0 % (ref 0.0–0.2)

## 2021-07-20 LAB — TROPONIN I (HIGH SENSITIVITY): Troponin I (High Sensitivity): 7 ng/L (ref <18)

## 2021-07-20 MED ORDER — ALBUTEROL SULFATE HFA 108 (90 BASE) MCG/ACT IN AERS
1.0000 | INHALATION_SPRAY | RESPIRATORY_TRACT | Status: DC | PRN
  Filled 2021-07-20: qty 6.7

## 2021-07-20 MED ORDER — IPRATROPIUM-ALBUTEROL 0.5-2.5 (3) MG/3ML IN SOLN
3.0000 mL | Freq: Once | RESPIRATORY_TRACT | Status: AC
  Administered 2021-07-20: 3 mL via RESPIRATORY_TRACT
  Filled 2021-07-20: qty 3

## 2021-07-20 MED ORDER — ALBUTEROL SULFATE (2.5 MG/3ML) 0.083% IN NEBU: 2.5000 mg | INHALATION_SOLUTION | RESPIRATORY_TRACT | Status: DC | PRN

## 2021-07-20 MED ORDER — PREDNISONE 20 MG PO TABS
60.0000 mg | ORAL_TABLET | Freq: Once | ORAL | Status: AC
  Administered 2021-07-20: 60 mg via ORAL
  Filled 2021-07-20: qty 3

## 2021-07-20 NOTE — ED Provider Notes (Signed)
[facility] Emergency Department Provider Note  ____________________________________________   I have reviewed the triage vital signs and the nursing notes.   HISTORY  Chief Complaint Shortness of Breath   History limited by: Not Limited   HPI Abigail Molina is a 55 y.o. female who presents to the emergency department today because of continued shortness of breath, cough and chest pressure.  Patient states that roughly 2 weeks ago she tested positive for COVID.  She states since that time she has been having issues with cough, shortness of breath and some chest pressure and tightness.  She has been working with her primary care doctor who is prescribed multiple cough medications.  She was also given antivirals and an albuterol inhaler.  She states she did not feel like the albuterol inhaler was effective so has not been using that regularly.  She denies any pre-existing lung disease.  Denies any history of smoking.   Records reviewed. Per medical record review patient has a history of recent diagnosis of covid.   Past Medical History:  Diagnosis Date   Cancer [facility])    Endometrial cancer   Depression    Diabetes mellitus without complication ([place])    Fatty liver disease, nonalcoholic [id number]   GERD (gastroesophageal reflux disease)    Hyperlipidemia    Hypertension    Overactive bladder    Psoriasis 2015    Patient Active Problem List   Diagnosis Date Noted   Abdominal pain 03/04/2018   Diabetes ([place]) 12/12/2015   Hyperlipidemia 12/12/2015   Hypertension 05/23/2015   Acid reflux 05/23/2015    Past Surgical History:  Procedure Laterality Date   ABDOMINAL HYSTERECTOMY  2007   CHOLECYSTECTOMY  2000    Prior to Admission medications   Medication Sig Start Date End Date Taking? Authorizing Provider  amitriptyline (ELAVIL) 10 MG tablet TAKE ONE TABLET BY MOUTH ONCE DAILY AT BEDTIME. 07/03/21     aspirin 81 MG tablet Take 81 mg by mouth daily.     [provider]  brexpiprazole (REXULTI) 2 MG TABS tablet Take 2 mg by mouth.    [provider]  brexpiprazole (REXULTI) 2 MG TABS tablet TAKE ONE TABLET BY MOUTH ONCE DAILY AT BEDTIME. 07/03/21     citalopram (CELEXA) 40 MG tablet Take 40 mg by mouth daily.     [provider]  citalopram (CELEXA) 40 MG tablet TAKE ONE TABLET BY MOUTH ONCE DAILY IN THE MORNING. 07/03/21     Dulaglutide (TRULICITY) 1.5 0000000 SOPN Inject 1.5 mg into the skin once a week. Thursdays    [provider]  insulin glargine (LANTUS) 100 UNIT/ML injection Inject 0.5 mLs (50 Units total) into the skin at bedtime. Inject 42 units once daily. Patient taking differently: Inject 66 Units into the skin at bedtime. 06/17/18   Doles-Johnson, Teah, NP  Insulin Pen Needle 32G X 6 MM MISC 1 Syringe by Does not apply route. Use with Victoza.    [provider]  lisinopril (ZESTRIL) 40 MG tablet TAKE ONE TABLET BY MOUTH EVERY DAY 03/20/20   McGowan, Larene Beach A, PA-C  metFORMIN (GLUCOPHAGE) 1000 MG tablet TAKE ONE TABLET BY MOUTH 2 TIMES A DAY WITH A MEAL. 10/24/19   Zara Council A, PA-C  mometasone (ELOCON) 0.1 % ointment Apply topically daily. Apply as needed for psoriasis Patient not taking: Reported on 12/28/2020    [provider]  Multiple Vitamin (MULTIVITAMIN) tablet Take 1 tablet by mouth daily. Patient not taking: Reported on  12/28/2020    [provider]  naproxen sodium (ALEVE) 220 MG tablet Take 220 mg by mouth 2 (two) times daily as needed (pain).    [provider]  oxyCODONE (ROXICODONE) 5 MG immediate release tablet 1 every 6-8 hours as needed for pain. Patient not taking: No sig reported 02/26/19   Letitia Neri L, PA-C  pantoprazole (PROTONIX) 40 MG tablet TAKE ONE TABLET BY MOUTH EVERY DAY 10/24/19   McGowan, Larene Beach A, PA-C  simvastatin (ZOCOR) 20 MG tablet TAKE ONE TABLET BY MOUTH EVERY EVENING 10/24/19   McGowan, Larene Beach A, PA-C  tolterodine  (DETROL LA) 2 MG 24 hr capsule Take 2 mg by mouth daily.    [provider]  VICTOZA 18 MG/3ML SOPN Inject 1.2 mg once daily Patient not taking: Reported on 12/28/2020 12/03/17   Doles-Johnson, Teah, NP    Allergies Patient has no known allergies.  Family History  Problem Relation Age of Onset   Diabetes Mother    Hypertension Mother    Depression Mother     Social History Social History   Tobacco Use   Smoking status: Never   Smokeless tobacco: Never  Vaping Use   Vaping Use: Never used  Substance Use Topics   Alcohol use: Yes    Comment:  Rarely. One or two drinks a year    Drug use: No    Review of Systems Constitutional: Low grade fever. Eyes: No visual changes. ENT: Positive for sore throat.  Cardiovascular: Positive for chest pressure with coughing.  Respiratory: Positive for cough and shortness of breath. Gastrointestinal: No abdominal pain.   Genitourinary: Negative for dysuria. Musculoskeletal: Positive for body aches.  Skin: Negative for rash. Neurological: Negative for focal weakness or numbness.  ____________________________________________   PHYSICAL EXAM:  VITAL SIGNS: ED Triage Vitals  Enc Vitals Group     BP 07/20/21 1840 136/62     Pulse Rate 07/20/21 1830 (!) 102     Resp 07/20/21 1830 19     Temp 07/20/21 1830 100.1 F (37.8 C)     Temp Source 07/20/21 1830 Oral     SpO2 07/20/21 1830 96 %     Weight 07/20/21 1832 254 lb (115.2 kg)     Height 07/20/21 1832 '5\' 4"'$  (1.626 m)     Head Circumference --      Peak Flow --      Pain Score 07/20/21 1832 10    Constitutional: Alert and oriented.  Eyes: Conjunctivae are normal.  ENT      Head: Normocephalic and atraumatic.      Nose: No congestion/rhinnorhea.      Mouth/Throat: Mucous membranes are moist.      Neck: No stridor. Hematological/Lymphatic/Immunilogical: No cervical lymphadenopathy. Cardiovascular: Normal rate, regular rhythm.  No murmurs, rubs, or gallops.   Respiratory: Normal respiratory effort without tachypnea nor retractions. Breath sounds are clear and equal bilaterally. No wheezes/rales/rhonchi. Gastrointestinal: Soft and non tender. No rebound. No guarding.  Genitourinary: Deferred Musculoskeletal: Normal range of motion in all extremities. No lower extremity edema. Neurologic:  Normal speech and language. No gross focal neurologic deficits are appreciated.  Skin:  Skin is warm, dry and intact. No rash noted. Psychiatric: Mood and affect are normal. Speech and behavior are normal. Patient exhibits appropriate insight and judgment.  ____________________________________________    LABS (pertinent positives/negatives)  Trop hs 7 CBC wbc 4.8, hgb 10.6, plt 175 BMP wnl except glu 203  ____________________________________________   EKG  I, Nance Pear, attending physician,  personally viewed and interpreted this EKG  EKG Time: 1840 Rate: 95 Rhythm: normal sinus rhythm Axis: normal Intervals: qtc 469 QRS: narrow, q waves II, III, avF ST changes: no st elevation Impression: abnormal ekg   ____________________________________________    RADIOLOGY  CXR No active cardiopulmonary disease  ____________________________________________   PROCEDURES  Procedures  ____________________________________________   INITIAL IMPRESSION / ASSESSMENT AND PLAN / ED COURSE  Pertinent labs & imaging results that were available during my care of the patient were reviewed by me and considered in my medical decision making (see chart for details).   Patient presented to the emergency department today with continued symptoms secondary to [place].  On exam patient without any distress.  It does not sound like she has tried any steroids for symptoms yet.  Will plan on giving steroids and duo nebs here.  Discussed possibility of blood clot with patient although I have very low suspicion for blood clot at this time.  No hemoptysis, sharp  chest pain.  No hypoxia.  Also encourage patient to try to continue using albuterol inhaler that she was previously prescribed.  ____________________________________________   FINAL CLINICAL IMPRESSION(S) / ED DIAGNOSES  Final diagnoses:  Shortness of breath  Cough  COVID-19     Note: This dictation was prepared with Dragon dictation. Any transcriptional errors that result from this process are unintentional     Nance Pear, MD 07/20/21 2256

## 2021-07-20 NOTE — ED Triage Notes (Signed)
Pt states she tested positive for covid about 2 weeks ago and has taken all of antiviral prescribes- c/o continued SOB, chest pressure, low grade fever, sore throat, and body aches. Pt is AOX4, pt reports nausea and multiple episodes of diarhea. Cough noted- pt reports gray secretions. Breathing even and unlabored.

## 2021-07-20 NOTE — Discharge Instructions (Addendum)
Please seek medical attention for any high fevers, chest pain, shortness of breath, change in behavior, persistent vomiting, bloody stool or any other new or concerning symptoms.  

## 2021-07-21 NOTE — ED Notes (Signed)
Reviewed discharge instructions and follow-up care with patient. Patient verbalized understanding of all information reviewed. Patient stable, with no distress noted at this time.    

## 2021-07-30 DIAGNOSIS — U071 COVID-19: Principal | ICD-10-CM

## 2021-07-30 MED ORDER — BENZONATATE 100 MG CAPSULE
ORAL_CAPSULE | Freq: Three times a day (TID) | ORAL | 1 refills | 7.00000 days | PRN
Start: 2021-07-30 — End: 2022-07-30

## 2021-07-30 NOTE — Unmapped (Unsigned)
The [facility] has made a second and final attempt to reach this patient to refill the following medication:Cosentyx.      We have left voicemails on the following phone numbers: [phone], have been unable to leave messages on the following phone numbers: [phone] and have sent a MyChart message.    Dates contacted: 8/24 and 8/30  Last scheduled delivery: 7/28    The patient may be at risk of non-compliance with this medication. The patient should call the [facility] at [phone]  Option 4, then Option 2 (all other specialty patients) to refill medication.    Vaidehi Braddy D Administrator [facility] Specialty Technician

## 2021-07-31 ENCOUNTER — Other Ambulatory Visit: Payer: Self-pay

## 2021-07-31 MED ORDER — BENZONATATE 100 MG CAPSULE
ORAL_CAPSULE | Freq: Three times a day (TID) | ORAL | 1 refills | 7 days | Status: CP | PRN
Start: 2021-07-31 — End: 2022-07-31

## 2021-07-31 MED FILL — METFORMIN ER 500 MG TABLET,EXTENDED RELEASE 24 HR: ORAL | 90 days supply | Qty: 360 | Fill #1

## 2021-07-31 NOTE — Unmapped (Signed)
Patient is requesting the following refill  Requested Prescriptions     Pending Prescriptions Disp Refills   ??? benzonatate (TESSALON PERLES) 100 MG capsule 40 capsule 1     Sig: Take 1-2 capsules (100-200 mg total) by mouth Three (3) times a day as needed for cough.       Recent Visits  Date Type Provider Dept   05/17/21 Office Visit Desmond Dike, NP [place] [facility]   05/02/21 Office Visit Deneise Lever, FNP [place] [facility]   03/13/21 Office Visit Desmond Dike, NP [place] [facility]   11/27/20 Office Visit Desmond Dike, NP [place] [facility]   08/22/20 Office Visit Desmond Dike, NP [place] [facility]   Showing recent visits within past 365 days with a meds authorizing provider and meeting all other requirements  Future Appointments  Date Type Provider Dept   09/12/21 Appointment Desmond Dike, NP [place] [facility]   Showing future appointments within next 365 days with a meds authorizing provider and meeting all other requirements       Labs: Not applicable this refill

## 2021-08-01 ENCOUNTER — Other Ambulatory Visit: Payer: Self-pay

## 2021-08-02 ENCOUNTER — Other Ambulatory Visit: Payer: Self-pay

## 2021-08-09 ENCOUNTER — Ambulatory Visit: Admit: 2021-08-09 | Payer: MEDICAID

## 2021-08-12 ENCOUNTER — Other Ambulatory Visit: Payer: Self-pay

## 2021-08-14 ENCOUNTER — Other Ambulatory Visit: Payer: Self-pay

## 2021-08-15 ENCOUNTER — Other Ambulatory Visit: Payer: Self-pay

## 2021-08-16 ENCOUNTER — Other Ambulatory Visit: Payer: Self-pay

## 2021-08-21 ENCOUNTER — Other Ambulatory Visit: Payer: Self-pay

## 2021-08-26 ENCOUNTER — Other Ambulatory Visit: Payer: Self-pay

## 2021-08-27 ENCOUNTER — Other Ambulatory Visit: Payer: Self-pay

## 2021-08-28 ENCOUNTER — Other Ambulatory Visit: Payer: Self-pay

## 2021-08-28 ENCOUNTER — Ambulatory Visit: Admit: 2021-08-28 | Discharge: 2021-08-28 | Payer: MEDICAID

## 2021-09-02 ENCOUNTER — Other Ambulatory Visit: Payer: Self-pay

## 2021-09-02 ENCOUNTER — Ambulatory Visit: Admit: 2021-09-02 | Discharge: 2021-09-03 | Payer: MEDICAID

## 2021-09-02 NOTE — Unmapped (Signed)
Your Mammogram looks fine.

## 2021-09-03 ENCOUNTER — Other Ambulatory Visit: Payer: Self-pay

## 2021-09-03 ENCOUNTER — Emergency Department
Admission: EM | Admit: 2021-09-03 | Discharge: 2021-09-03 | Disposition: A | Discharge status: Home / Self Care | Payer: Medicaid Other | Attending: Student in an Organized Health Care Education/Training Program | Admitting: Student in an Organized Health Care Education/Training Program

## 2021-09-03 DIAGNOSIS — Z8542 Personal history of malignant neoplasm of other parts of uterus: Secondary | ICD-10-CM | POA: Insufficient documentation

## 2021-09-03 DIAGNOSIS — L03011 Cellulitis of right finger: Secondary | ICD-10-CM | POA: Diagnosis not present

## 2021-09-03 DIAGNOSIS — Z794 Long term (current) use of insulin: Secondary | ICD-10-CM | POA: Diagnosis not present

## 2021-09-03 DIAGNOSIS — Z79899 Other long term (current) drug therapy: Secondary | ICD-10-CM | POA: Diagnosis not present

## 2021-09-03 DIAGNOSIS — X58XXXA Exposure to other specified factors, initial encounter: Secondary | ICD-10-CM | POA: Diagnosis not present

## 2021-09-03 DIAGNOSIS — S6992XA Unspecified injury of left wrist, hand and finger(s), initial encounter: Secondary | ICD-10-CM | POA: Insufficient documentation

## 2021-09-03 DIAGNOSIS — I10 Essential (primary) hypertension: Secondary | ICD-10-CM | POA: Diagnosis not present

## 2021-09-03 DIAGNOSIS — Z7984 Long term (current) use of oral hypoglycemic drugs: Secondary | ICD-10-CM | POA: Insufficient documentation

## 2021-09-03 DIAGNOSIS — E119 Type 2 diabetes mellitus without complications: Secondary | ICD-10-CM | POA: Diagnosis not present

## 2021-09-03 DIAGNOSIS — Z7982 Long term (current) use of aspirin: Secondary | ICD-10-CM | POA: Diagnosis not present

## 2021-09-03 MED ORDER — AMOXICILLIN-POT CLAVULANATE 875-125 MG PO TABS: 1.0000 | ORAL_TABLET | Freq: Two times a day (BID) | ORAL | 0 refills | Status: AC

## 2021-09-03 MED ORDER — FLUCONAZOLE 150 MG PO TABS: 150.0000 mg | ORAL_TABLET | Freq: Every day | ORAL | 0 refills | Status: DC

## 2021-09-03 MED ORDER — LIDOCAINE HCL (PF) 1 % IJ SOLN
5.0000 mL | Freq: Once | INTRAMUSCULAR | Status: AC
  Administered 2021-09-03: 5 mL via INTRADERMAL
  Filled 2021-09-03: qty 5

## 2021-09-03 NOTE — ED Provider Notes (Signed)
[facility] Emergency Department Provider Note    Event Date/Time   First MD Initiated Contact with Patient 09/03/21 1249     (approximate)  I have reviewed the triage vital signs and the nursing notes.   HISTORY  Chief Complaint left finger injury    HPI Abigail Molina is a 55 y.o. female below listed past medical history presents to the ER for evaluation of paronychia of the right index finger.  States she does bite her nails and has had these been in the past.  No other associated symptoms.  Describes the pain is mild to moderate in severity.  Symptoms cropped up over the past few days but became more severe and developed findings more consistent with paronychia over the past 24 hours  Past Medical History:  Diagnosis Date  . Cancer [facility])    Endometrial cancer  . Depression   . Diabetes mellitus without complication ([place])   . Fatty liver disease, nonalcoholic 1194  . GERD (gastroesophageal reflux disease)   . Hyperlipidemia   . Hypertension   . Overactive bladder   . Psoriasis 2015   Family History  Problem Relation Age of Onset  . Diabetes Mother   . Hypertension Mother   . Depression Mother    Past Surgical History:  Procedure Laterality Date  . ABDOMINAL HYSTERECTOMY  2007  . CHOLECYSTECTOMY  2000   Patient Active Problem List   Diagnosis Date Noted  . Abdominal pain 03/04/2018  . Diabetes ([place]) 12/12/2015  . Hyperlipidemia 12/12/2015  . Hypertension 05/23/2015  . Acid reflux 05/23/2015      Prior to Admission medications   Medication Sig Start Date End Date Taking? Authorizing Provider  amoxicillin-clavulanate (AUGMENTIN) 875-125 MG tablet Take 1 tablet by mouth 2 (two) times daily for 7 days. 09/03/21 09/10/21 Yes Merlyn Lot, MD  fluconazole (DIFLUCAN) 150 MG tablet Take 1 tablet (150 mg total) by mouth daily. 09/03/21  Yes Merlyn Lot, MD  amitriptyline (ELAVIL) 10 MG tablet TAKE ONE TABLET BY MOUTH ONCE DAILY AT  BEDTIME. 07/03/21     aspirin 81 MG tablet Take 81 mg by mouth daily.    [provider]  brexpiprazole (REXULTI) 2 MG TABS tablet Take 2 mg by mouth.    [provider]  brexpiprazole (REXULTI) 2 MG TABS tablet TAKE ONE TABLET BY MOUTH ONCE DAILY AT BEDTIME. 07/03/21     citalopram (CELEXA) 40 MG tablet Take 40 mg by mouth daily.     [provider]  citalopram (CELEXA) 40 MG tablet TAKE ONE TABLET BY MOUTH ONCE DAILY IN THE MORNING. 07/03/21     Dulaglutide (TRULICITY) 1.5 RD/4.0CX SOPN Inject 1.5 mg into the skin once a week. Thursdays    [provider]  insulin glargine (LANTUS) 100 UNIT/ML injection Inject 0.5 mLs (50 Units total) into the skin at bedtime. Inject 42 units once daily. Patient taking differently: Inject 66 Units into the skin at bedtime. 06/17/18   Doles-Johnson, Teah, NP  Insulin Pen Needle 32G X 6 MM MISC 1 Syringe by Does not apply route. Use with Victoza.    [provider]  lisinopril (ZESTRIL) 40 MG tablet TAKE ONE TABLET BY MOUTH EVERY DAY 03/20/20   McGowan, Larene Beach A, PA-C  metFORMIN (GLUCOPHAGE) 1000 MG tablet TAKE ONE TABLET BY MOUTH 2 TIMES A DAY WITH A MEAL. 10/24/19   Zara Council A, PA-C  mometasone (ELOCON) 0.1 % ointment Apply topically daily. Apply as needed for psoriasis Patient not  taking: Reported on 12/28/2020    [provider]  Multiple Vitamin (MULTIVITAMIN) tablet Take 1 tablet by mouth daily. Patient not taking: Reported on 12/28/2020    [provider]  naproxen sodium (ALEVE) 220 MG tablet Take 220 mg by mouth 2 (two) times daily as needed (pain).    [provider]  oxyCODONE (ROXICODONE) 5 MG immediate release tablet 1 every 6-8 hours as needed for pain. Patient not taking: No sig reported 02/26/19   Letitia Neri L, PA-C  pantoprazole (PROTONIX) 40 MG tablet TAKE ONE TABLET BY MOUTH EVERY DAY 10/24/19   McGowan, Larene Beach A, PA-C  simvastatin (ZOCOR) 20 MG tablet TAKE ONE TABLET  BY MOUTH EVERY EVENING 10/24/19   McGowan, Larene Beach A, PA-C  tolterodine (DETROL LA) 2 MG 24 hr capsule Take 2 mg by mouth daily.    [provider]  VICTOZA 18 MG/3ML SOPN Inject 1.2 mg once daily Patient not taking: Reported on 12/28/2020 12/03/17   Doles-Johnson, Teah, NP    Allergies Patient has no known allergies.    Social History Social History   Tobacco Use  . Smoking status: Never  . Smokeless tobacco: Never  Vaping Use  . Vaping Use: Never used  Substance Use Topics  . Alcohol use: Yes    Comment:  Rarely. One or two drinks a year   . Drug use: No    Review of Systems Patient denies headaches, rhinorrhea, blurry vision, numbness, shortness of breath, chest pain, edema, cough, abdominal pain, nausea, vomiting, diarrhea, dysuria, fevers, rashes or hallucinations unless otherwise stated above in HPI. ____________________________________________   PHYSICAL EXAM:  VITAL SIGNS: Vitals:   09/03/21 1140  BP: (!) 92/51  Pulse: 98  Resp: 15  Temp: 98.2 F (36.8 C)  SpO2: 96%    Constitutional: Alert and oriented. Well appearing and in no acute distress. Eyes: Conjunctivae are normal.  Head: Atraumatic. Nose: No congestion/rhinnorhea. Mouth/Throat: Mucous membranes are moist.   Neck: Painless ROM.  Cardiovascular:   Good peripheral circulation. Respiratory: Normal respiratory effort.  No retractions.  Gastrointestinal: Soft and nontender.  Musculoskeletal: No lower extremity tenderness .  No joint effusions.  Small paronychia to the right index finger. Neurologic:  Normal speech and language. No gross focal neurologic deficits are appreciated.  Skin:  Skin is warm, dry and intact. No rash noted. Psychiatric: Mood and affect are normal. Speech and behavior are normal.  ____________________________________________   LABS (all labs ordered are listed, but only abnormal results are displayed)  No results found for this or any previous visit (from the past  24 hour(s)). ____________________________________________ ____________________________________________   PROCEDURES  Procedure(s) performed:  Marland KitchenMarland KitchenIncision and Drainage  Date/Time: 09/03/2021 1:22 PM Performed by: Merlyn Lot, MD Authorized by: Merlyn Lot, MD   Consent:    Consent obtained:  Verbal   Consent given by:  Patient   Risks discussed:  Bleeding, infection, incomplete drainage and pain   Alternatives discussed:  Alternative treatment, delayed treatment and observation Location:    Type:  Abscess   Size:  1   Location:  Upper extremity   Upper extremity location:  Finger   Finger location:  R ring finger Pre-procedure details:    Skin preparation:  Antiseptic wash Anesthesia:    Anesthesia method:  Local infiltration   Local anesthetic:  Lidocaine 1% w/o epi Procedure type:    Complexity:  Simple Procedure details:    Incision types:  Stab incision   Incision depth:  Dermal   Wound management:  Probed and deloculated   Drainage:  Purulent   Drainage amount:  Moderate   Wound treatment:  Wound left open   Packing materials:  None Post-procedure details:    Procedure completion:  Tolerated well, no immediate complications    Critical Care performed: no ____________________________________________   INITIAL IMPRESSION / ASSESSMENT AND PLAN / ED COURSE  Pertinent labs & imaging results that were available during my care of the patient were reviewed by me and considered in my medical decision making (see chart for details).   DDX: Paronychia cellulitis, foreign body, abscess  Abigail Molina is a 55 y.o. who presents to the ED with presentation as described above consistent with paronychia to the right index finger.  As I indeed as described above.  Placed on antibiotics.  Discussed outpatient follow-up.  Patient agreeable to plan.  The patient was evaluated in Emergency Department today for the symptoms described in the history of present illness.  He/she was evaluated in the context of the global COVID-19 pandemic, which necessitated consideration that the patient might be at risk for infection with the SARS-CoV-2 virus that causes COVID-19. Institutional protocols and algorithms that pertain to the evaluation of patients at risk for COVID-19 are in a state of rapid change based on information released by regulatory bodies including the CDC and federal and state organizations. These policies and algorithms were followed during the patient's care in the ED.       ____________________________________________   FINAL CLINICAL IMPRESSION(S) / ED DIAGNOSES  Final diagnoses:  Paronychia of finger, right      NEW MEDICATIONS STARTED DURING THIS VISIT:  New Prescriptions   AMOXICILLIN-CLAVULANATE (AUGMENTIN) 875-125 MG TABLET    Take 1 tablet by mouth 2 (two) times daily for 7 days.   FLUCONAZOLE (DIFLUCAN) 150 MG TABLET    Take 1 tablet (150 mg total) by mouth daily.     Note:  This document was prepared using Dragon voice recognition software and may include unintentional dictation errors.     Merlyn Lot, MD 09/03/21 1328

## 2021-09-03 NOTE — ED Triage Notes (Signed)
Pt comes with c/o left hand pain. Pt states redness and swelling to left ring finger. Pt states this started few days ago. Pt states no relief.

## 2021-09-04 ENCOUNTER — Other Ambulatory Visit: Payer: Self-pay

## 2021-09-04 DIAGNOSIS — Z1231 Encounter for screening mammogram for malignant neoplasm of breast: Principal | ICD-10-CM

## 2021-09-05 ENCOUNTER — Other Ambulatory Visit: Payer: Self-pay

## 2021-09-12 ENCOUNTER — Ambulatory Visit: Admit: 2021-09-12 | Discharge: 2021-09-13 | Payer: MEDICAID | Attending: Family | Primary: Family

## 2021-09-12 DIAGNOSIS — D649 Anemia, unspecified: Principal | ICD-10-CM

## 2021-09-12 DIAGNOSIS — E119 Type 2 diabetes mellitus without complications: Principal | ICD-10-CM

## 2021-09-12 LAB — HEMOGLOBIN A1C
ESTIMATED AVERAGE GLUCOSE: 278 mg/dL
HEMOGLOBIN A1C: 11.3 % — ABNORMAL HIGH (ref 4.8–5.6)

## 2021-09-12 LAB — CBC
HEMATOCRIT: 31.7 % — ABNORMAL LOW (ref 34.0–44.0)
HEMOGLOBIN: 9.9 g/dL — ABNORMAL LOW (ref 11.3–14.9)
MEAN CORPUSCULAR HEMOGLOBIN CONC: 31.3 g/dL — ABNORMAL LOW (ref 32.0–36.0)
MEAN CORPUSCULAR HEMOGLOBIN: 22.6 pg — ABNORMAL LOW (ref 25.9–32.4)
MEAN CORPUSCULAR VOLUME: 72.3 fL — ABNORMAL LOW (ref 77.6–95.7)
MEAN PLATELET VOLUME: 9.6 fL (ref 6.8–10.7)
PLATELET COUNT: 153 10*9/L (ref 150–450)
RED BLOOD CELL COUNT: 4.38 10*12/L (ref 3.95–5.13)
RED CELL DISTRIBUTION WIDTH: 17.4 % — ABNORMAL HIGH (ref 12.2–15.2)
WBC ADJUSTED: 2.9 10*9/L — ABNORMAL LOW (ref 3.6–11.2)

## 2021-09-12 LAB — LIPID PANEL
CHOLESTEROL/HDL RATIO SCREEN: 3.1 (ref 1.0–4.5)
CHOLESTEROL: 122 mg/dL (ref <=200)
HDL CHOLESTEROL: 39 mg/dL — ABNORMAL LOW (ref 40–60)
LDL CHOLESTEROL CALCULATED: 62 mg/dL (ref 40–99)
NON-HDL CHOLESTEROL: 83 mg/dL (ref 70–130)
TRIGLYCERIDES: 106 mg/dL (ref 0–150)
VLDL CHOLESTEROL CAL: 21.2 mg/dL (ref 11–40)

## 2021-09-12 LAB — IRON PANEL
IRON SATURATION: 12 % — ABNORMAL LOW (ref 20–55)
IRON: 51 ug/dL
TOTAL IRON BINDING CAPACITY: 423 ug/dL (ref 250–425)

## 2021-09-12 LAB — COMPREHENSIVE METABOLIC PANEL
ALBUMIN: 2.9 g/dL — ABNORMAL LOW (ref 3.4–5.0)
ALKALINE PHOSPHATASE: 131 U/L — ABNORMAL HIGH (ref 46–116)
ALT (SGPT): 20 U/L (ref 10–49)
ANION GAP: 13 mmol/L (ref 5–14)
AST (SGOT): 49 U/L — ABNORMAL HIGH (ref <=34)
BILIRUBIN TOTAL: 2.2 mg/dL — ABNORMAL HIGH (ref 0.3–1.2)
BLOOD UREA NITROGEN: 13 mg/dL (ref 9–23)
BUN / CREAT RATIO: 21
CALCIUM: 9.4 mg/dL (ref 8.7–10.4)
CHLORIDE: 107 mmol/L (ref 98–107)
CO2: 21 mmol/L (ref 20.0–31.0)
CREATININE: 0.63 mg/dL
EGFR CKD-EPI (2021) FEMALE: >90 mL/min/{1.73_m2} (ref >=60)
GLUCOSE RANDOM: 181 mg/dL — ABNORMAL HIGH (ref 70–179)
POTASSIUM: 4.3 mmol/L (ref 3.5–5.1)
PROTEIN TOTAL: 7.2 g/dL (ref 5.7–8.2)
SODIUM: 141 mmol/L (ref 135–145)

## 2021-09-12 MED ORDER — SLOW FE 142 MG (45 MG IRON) TABLET,EXTENDED RELEASE
ORAL_TABLET | Freq: Two times a day (BID) | ORAL | 3 refills | 0 days | Status: CP
Start: 2021-09-12 — End: ?
  Filled 2021-09-20: qty 60, 30d supply, fill #0

## 2021-09-12 NOTE — Unmapped (Signed)
[facility] Note:  [place], [place] [address]. Phone [phone]    09/15/2021    Patient Name:   Ashley Briggs    MRN: [id number]    Demographics:    Age-  55 y.o.     Date of Birth-  [birth year]    Chief complaint (CC):    Chief Complaint   Patient presents with   ??? Follow-up   ??? restless legs at night       Assessment/Plan:    Ashley Briggs is a very pleasant 55 y.o. female with CAD, HTN, Hepatic Steatosis, improving DM2 (HgbA1c 8.9 on 04/10/21, was 10.9), Hyperlipidemia, GERD (stable), and arthritis pain. Lost 12 pounds since last visit with increased activity. She c/o RLS for the past 4-6 weeks. CBC, Iron panel, CMP, Lipid panel, and A1C today. Will start slow release iron supplementation. Discussed and encouaged leg exercises, massaging, and warm compress prior to bed as needed for RLS.    Advised to continue mediations as listed and follow diabetic style/carb consistent diet. Advised to follow up with Cardiology for Echocardiogram and consult.     30 minutes of clinical time, approx 1/2 of time was face to face time. We have discussed medical, dietary, lifestyle, and health maintenance modifications to optimize health. Due to COVID-19, masks worn and standard precautions followed during visit. Medication adherence and barriers to the treatment plan have been addressed.  Patient voiced understanding, needs F/U visit as scheduled.     Diagnosis ICD-10-CM Associated Orders   1. Type 2 diabetes mellitus without complication, unspecified whether long term insulin use (CMS-HCC)  E11.9 CBC   Comprehensive metabolic panel   Hemoglobin A1c   Lipid panel (non-fasting)      2. Anemia, unspecified type   Restless legs D64.9 CBC     Iron Panel     3. Medical/Medication management, see above    4.  Health Maintenance Review:   Updated Influenza vaccine  Health Maintenance Due   Topic Date Due   ??? COVID-19 Vaccine (1) Advised and offered booster   ??? Retinal Eye Exam  Advised and ordered   ??? Mammogram Start Age 67  Advised and ordered      ??? Zoster Vaccines (1 of 2) Advised, agreeable     Reviewed:  Immunizations: TDaP, Influenza, Pneumovax, Shingles - information given, advised and pt agreeable  Mammograms - 2 years ago      Subjective:    History of present illness (HPI):       Ashley Briggs is a 55 y.o. female for office visit. She complains restless leg at night that keeps her awake and wakes her up during the night for the past 4 weeks. Reports history of arthritis in left knee and will be following up with orthopedics. Patient's medical history was reviewed, see Past Medical History. Medications used in the past were reviewed, see medications. Patient reports eating and eliminating well. Pt is sleeping well. Patient has not required emergency room treatment for these symptoms, and has not required hospitalization.     Denies HA, Fever, chest pain, N/V/D, bowel or bladder issues, shortness of breath, headache or swelling.     Relevant ROS: Reviewed 12 systems, positive findings listed, all others negative.    Pertinent Past Med Hx:    Past Medical History:   Diagnosis Date   ??? Anxiety    ??? Blepharitis    ??? Cancer (CMS-HCC)     endometrial 2007   ??? Diabetes  mellitus (CMS-HCC)    ??? Diabetic retinopathy (CMS-HCC)     Mild NPDR w/ME, left eye   ??? Endometrial cancer (CMS-HCC) 2007   ??? Gastroparesis    ??? Heart disease    ??? High cholesterol    ??? Hypertension    ??? Liver disease    ??? Non-alcoholic fatty liver disease    ??? 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 Psoriasis    ??? Ptosis of eyelid, left     Left upper eyelid   ??? Reflux        Medications:       Current Outpatient Medications:   ???  amitriptyline (ELAVIL) 10 MG tablet, TAKE ONE TABLET BY MOUTH ONCE DAILY AT BEDTIME., Disp: , Rfl:   ???  aspirin (ECOTRIN) 81 MG tablet, Take 81 mg by mouth daily., Disp: , Rfl:   ???  blood sugar diagnostic (ON CALL EXPRESS TEST STRIP) Strp, Use to check blood sugar Three (3) times a day., Disp: 300 each, Rfl: 1  ???  blood-glucose meter Misc, Use as directed by provider., Disp: 1 each, Rfl: 0  ???  brexpiprazole (REXULTI) 2 mg Tab, Take 2 mg by mouth daily., Disp: , Rfl:   ???  canagliflozin (INVOKANA) 300 mg Tab tablet, Take 1 tablet (300 mg total) by mouth every morning before breakfast., Disp: 90 tablet, Rfl: 3  ???  citalopram (CELEXA) 40 MG tablet, Take 40 mg by mouth daily., Disp: , Rfl:   ???  clonazePAM (KLONOPIN) 0.5 MG tablet, Take 0.5 mg by mouth daily., Disp: , Rfl:   ???  dulaglutide 1.5 mg/0.5 mL PnIj, Inject the contents of 2 syringes (3 mg total) under the skin every seven (7) days., Disp: 4 mL, Rfl: 6  ???  insulin glargine (LANTUS U-100 INSULIN) 100 unit/mL injection, Inject 0.66 mL (66 Units total) under the skin nightly., Disp: 10 mL, Rfl: 4  ???  lancets 30 gauge Misc, Use as directed to test once daily., Disp: 100 each, Rfl: 3  ???  lisinopriL (PRINIVIL,ZESTRIL) 40 MG tablet, Take 1 tablet (40 mg total) by mouth in the morning., Disp: 90 tablet, Rfl: 1  ???  metFORMIN (GLUCOPHAGE-XR) 500 MG 24 hr tablet, Take 2 tablets (1,000 mg total) by mouth in the morning and 2 tablets (1,000 mg total) in the evening. Take with meals., Disp: 360 tablet, Rfl: 1  ???  pantoprazole (PROTONIX) 40 MG tablet, Take 1 tablet (40 mg total) by mouth Two (2) times a day., Disp: 180 tablet, Rfl: 1  ???  secukinumab (COSENTYX PEN, 2 PENS,) 150 mg/mL PnIj injection, Inject the contents of 2 pens (300 mg) under the skin every 4 weeks ., Disp: 2 mL, Rfl: 12  ???  simvastatin (ZOCOR) 20 MG tablet, Take 1 tablet (20 mg total) by mouth every evening., Disp: 90 tablet, Rfl: 1  ???  tolterodine (DETROL LA) 4 MG 24 hr capsule, Take 1 capsule (4 mg total) by mouth daily., Disp: 90 capsule, Rfl: 2  ???  albuterol HFA 90 mcg/actuation inhaler, Inhale 2 puffs every six (6) hours as needed for shortness of breath. (Patient not taking: Reported on 09/12/2021), Disp: 8 g, Rfl: 1  ???  benzonatate (TESSALON PERLES) 100 MG capsule, Take 1-2 capsules (100-200 mg total) by mouth Three (3) times a day as needed for cough. (Patient not taking: Reported on 09/12/2021), Disp: 40 capsule, Rfl: 1  ???  ferrous sulfate (SLOW FE) 142 mg (45 mg  iron) TbER, Take 142 mg by mouth two (2) times a day., Disp: 60 tablet, Rfl: 3  ???  HYDROcodone-chlorpheniramine polistirex (TUSSIONEX PENNKINETIC) 10-8 mg/5 mL ER suspension, Take 5 mL by mouth every twelve (12) hours as needed for cough. (Patient not taking: Reported on 09/12/2021), Disp: 115 mL, Rfl: 0     Allergies:   No Known Allergies    Pertinent Social Hx and Habits: EMR reviewed    Pertinent Family Hx: EMR reviewed    Objective:      Vitals:    09/12/21 1305   BP: 116/60   BP Site: R Arm   BP Position: Sitting   Pulse: 89   SpO2: 98%   Weight: (!) 114.8 kg (253 lb)   Height: 162.6 cm (5' 4)   Body mass index is 43.43 kg/m??.       Physical Exam:    General Appearance: WDWN in NAD      Skin: W, D, I  Respiratory: Clear throughout  Cardio: RRR  Abdomen: Soft and non-tnder  Neurologic: A & O X 4, Grossly intact, stable gait  PSYCH: Behavior calm and cooperative      Diagnostics:     Lab Results   Component Value Date    WBC 2.9 (L) 09/12/2021    HGB 9.9 (L) 09/12/2021    HCT 31.7 (L) 09/12/2021    PLT 153 09/12/2021       Lab Results   Component Value Date    NA 141 09/12/2021    K 4.3 09/12/2021    CL 107 09/12/2021    CO2 21.0 09/12/2021    BUN 13 09/12/2021    CREATININE 0.63 09/12/2021    GLU 181 (H) 09/12/2021    CALCIUM 9.4 09/12/2021       Lab Results   Component Value Date    BILITOT 2.2 (H) 09/12/2021    PROT 7.2 09/12/2021    ALBUMIN 2.9 (L) 09/12/2021    ALT 20 09/12/2021    AST 49 (H) 09/12/2021    ALKPHOS 131 (H) 09/12/2021       Lab Results   Component Value Date    PT 12.2 01/31/2011    INR 1.1 01/31/2011    APTT 30.7 01/31/2011       Lab Results   Component Value Date    A1C 11.3 (H) 09/12/2021       Nori Riis, BSN, RN, AGNP-Student    Olena Leatherwood, NP.    Dr. Betha Loa, DNP, FNP-BC  Board Certified Doctor of Nursing Practice   [phone]

## 2021-09-19 MED ORDER — TRIAMCINOLONE ACETONIDE 0.1 % TOPICAL CREAM
Freq: Two times a day (BID) | TOPICAL | 1 refills | 0 days | Status: CP
Start: 2021-09-19 — End: 2022-09-19
  Filled 2021-09-26: qty 400, 30d supply, fill #0

## 2021-09-19 NOTE — Unmapped (Addendum)
[name] called in her Cosentyx today after an approximately 3 month gap. Upon questioning patient states that she has some personal things going on that have affected her ability to refill her medications. She states her psoriasis is flaring up and on a severity scale of 1-10 she rates it an 8. Offered to message provider for potential topical treatment to cover her until Cosentyx effects are seen.    [facility] Clinical Assessment & Refill Coordination Note    Ashley Briggs, DOB: 07-Jan-1966  Phone: [phone] (home)     All above HIPAA information was verified with patient.     Was a Nurse, learning disability used for this call? No    Specialty Medication(s):   Inflammatory Disorders: Cosentyx     Current Outpatient Medications   Medication Sig Dispense Refill   ??? albuterol HFA 90 mcg/actuation inhaler Inhale 2 puffs every six (6) hours as needed for shortness of breath. (Patient not taking: Reported on 09/12/2021) 8 g 1   ??? amitriptyline (ELAVIL) 10 MG tablet TAKE ONE TABLET BY MOUTH ONCE DAILY AT BEDTIME.     ??? aspirin (ECOTRIN) 81 MG tablet Take 81 mg by mouth daily.     ??? benzonatate (TESSALON PERLES) 100 MG capsule Take 1-2 capsules (100-200 mg total) by mouth Three (3) times a day as needed for cough. (Patient not taking: Reported on 09/12/2021) 40 capsule 1   ??? blood sugar diagnostic (ON CALL EXPRESS TEST STRIP) Strp Use to check blood sugar Three (3) times a day. 300 each 1   ??? blood-glucose meter Misc Use as directed by provider. 1 each 0   ??? brexpiprazole (REXULTI) 2 mg Tab Take 2 mg by mouth daily.     ??? canagliflozin (INVOKANA) 300 mg Tab tablet Take 1 tablet (300 mg total) by mouth every morning before breakfast. 90 tablet 3   ??? citalopram (CELEXA) 40 MG tablet Take 40 mg by mouth daily.     ??? clonazePAM (KLONOPIN) 0.5 MG tablet Take 0.5 mg by mouth daily.     ??? dulaglutide 1.5 mg/0.5 mL PnIj Inject the contents of 2 syringes (3 mg total) under the skin every seven (7) days. 4 mL 6   ??? ferrous sulfate (SLOW FE) 142 mg (45 mg iron) TbER Take 142 mg by mouth two (2) times a day. 60 tablet 3   ??? HYDROcodone-chlorpheniramine polistirex (TUSSIONEX PENNKINETIC) 10-8 mg/5 mL ER suspension Take 5 mL by mouth every twelve (12) hours as needed for cough. (Patient not taking: Reported on 09/12/2021) 115 mL 0   ??? insulin glargine (LANTUS U-100 INSULIN) 100 unit/mL injection Inject 0.66 mL (66 Units total) under the skin nightly. 10 mL 4   ??? lancets 30 gauge Misc Use as directed to test once daily. 100 each 3   ??? lisinopriL (PRINIVIL,ZESTRIL) 40 MG tablet Take 1 tablet (40 mg total) by mouth in the morning. 90 tablet 1   ??? metFORMIN (GLUCOPHAGE-XR) 500 MG 24 hr tablet Take 2 tablets (1,000 mg total) by mouth in the morning and 2 tablets (1,000 mg total) in the evening. Take with meals. 360 tablet 1   ??? pantoprazole (PROTONIX) 40 MG tablet Take 1 tablet (40 mg total) by mouth Two (2) times a day. 180 tablet 1   ??? secukinumab (COSENTYX PEN, 2 PENS,) 150 mg/mL PnIj injection Inject the contents of 2 pens (300 mg) under the skin every 4 weeks. 2 mL 12   ??? simvastatin (ZOCOR) 20 MG  tablet Take 1 tablet (20 mg total) by mouth every evening. 90 tablet 1   ??? tolterodine (DETROL LA) 4 MG 24 hr capsule Take 1 capsule (4 mg total) by mouth daily. 90 capsule 2     No current facility-administered medications for this visit.        Changes to medications: Ashley Briggs reports no changes at this time.    No Known Allergies    Changes to allergies: No    SPECIALTY MEDICATION ADHERENCE     Cosentyx 150 mg/ml: 0 days of medicine on hand       Medication Adherence    Patient reported X missed doses in the last month: 3-4  Specialty Medication: Cosentyx  Patient is on additional specialty medications: No  Informant: patient          Specialty medication(s) dose(s) confirmed: Regimen is correct and unchanged.     Are there any concerns with adherence? Yes: mild concerns. Patient has not filled since end of July. Upon questioning, patient states that she has had a lot going on which has affected her ability to refill her medication.    Adherence counseling provided? Not needed    CLINICAL MANAGEMENT AND INTERVENTION      Clinical Benefit Assessment:    Do you feel the medicine is effective or helping your condition? Yes    Clinical Benefit counseling provided? Not needed    Adverse Effects Assessment:    Are you experiencing any side effects? No    Are you experiencing difficulty administering your medicine? No    Quality of Life Assessment:    Quality of Life    Rheumatology  Oncology  Dermatology  Cystic Fibrosis          How many days over the past month did your Cosentyx  keep you from your normal activities? For example, brushing your teeth or getting up in the morning. Patient declined to answer    Have you discussed this with your provider? Not needed    Acute Infection Status:    Acute infections noted within Epic:  No active infections  Patient reported infection: None    Therapy Appropriateness:    Is therapy appropriate and patient progressing towards therapeutic goals? Yes, therapy is appropriate and should be continued    DISEASE/MEDICATION-SPECIFIC INFORMATION      For patients on injectable medications: Patient currently has 0 doses left.  Next injection is scheduled for ASAP.    PATIENT SPECIFIC NEEDS     - Does the patient have any physical, cognitive, or cultural barriers? No    - Is the patient high risk? No    - Does the patient require a Care Management Plan? No     - Does the patient require physician intervention or other additional services (i.e. nutrition, smoking cessation, social work)? No      SHIPPING     Specialty Medication(s) to be Shipped:   Inflammatory Disorders: Cosentyx    Other medication(s) to be shipped: Invokana, Lantus, lisinopril, pantoprazole, simvastatin, Slow_fe, tolterodine, Trulicity     Changes to insurance: No    Delivery Scheduled: Yes, Expected medication delivery date: 09/20/21.     Medication will be delivered via Same Day Courier to the confirmed prescription address in [facility].    The patient will receive a drug information handout for each medication shipped and additional FDA Medication Guides as required.  Verified that patient has previously received a Conservation officer, historic buildings and a Surveyor, mining.  The patient or caregiver noted above participated in the development of this care plan and knows that they can request review of or adjustments to the care plan at any time.      All of the patient's questions and concerns have been addressed.    Arnold Long   [facility] Specialty Pharmacist

## 2021-09-20 MED FILL — INVOKANA 300 MG TABLET: ORAL | 30 days supply | Qty: 30 | Fill #6

## 2021-09-20 MED FILL — TRULICITY 1.5 MG/0.5 ML SUBCUTANEOUS PEN INJECTOR: SUBCUTANEOUS | 28 days supply | Qty: 4 | Fill #3

## 2021-09-20 MED FILL — COSENTYX PEN 300 MG/2 PENS (150 MG/ML) SUBCUTANEOUS: 28 days supply | Qty: 2 | Fill #4

## 2021-09-20 MED FILL — LISINOPRIL 40 MG TABLET: ORAL | 90 days supply | Qty: 90 | Fill #1

## 2021-09-20 MED FILL — TOLTERODINE ER 4 MG CAPSULE,EXTENDED RELEASE 24 HR: ORAL | 90 days supply | Qty: 90 | Fill #2

## 2021-09-20 MED FILL — SIMVASTATIN 20 MG TABLET: ORAL | 90 days supply | Qty: 90 | Fill #1

## 2021-09-20 MED FILL — PANTOPRAZOLE 40 MG TABLET,DELAYED RELEASE: ORAL | 30 days supply | Qty: 60 | Fill #1

## 2021-09-20 MED FILL — LANTUS U-100 INSULIN 100 UNIT/ML SUBCUTANEOUS SOLUTION: SUBCUTANEOUS | 15 days supply | Qty: 10 | Fill #1

## 2021-09-20 NOTE — Unmapped (Signed)
Addended by: Janece Canterbury on: 09/19/2021 05:07 PM     Modules accepted: Orders

## 2021-09-23 ENCOUNTER — Other Ambulatory Visit: Payer: Self-pay

## 2021-09-23 NOTE — Unmapped (Signed)
Hello Ashley Briggs,    I have reviewed your lab testing and your blood glucose has improved and cell counts are stable. Liver enzymes have improved. Please continue medications as listed on your records and take iron supplement daily.    We need to keep working on these abnormal levels with a diabetic eating plan, medications, and a daily walking regimen.     Please follow up in the office as needed and as scheduled.    Take Care,  Dr. Nira Retort

## 2021-09-24 ENCOUNTER — Other Ambulatory Visit: Payer: Self-pay

## 2021-09-24 MED ORDER — PROMETHAZINE 12.5 MG TABLET
ORAL_TABLET | Freq: Four times a day (QID) | ORAL | 1 refills | 3 days | Status: CP | PRN
Start: 2021-09-24 — End: 2021-10-01
  Filled 2021-09-26: qty 10, 3d supply, fill #0

## 2021-09-24 MED ORDER — AMITRIPTYLINE HCL 10 MG PO TABS
ORAL_TABLET | ORAL | 1 refills | Status: DC
  Filled 2021-09-24: qty 30, 30d supply, fill #0
  Filled 2021-10-09 – 2021-10-22 (×2): qty 30, 30d supply, fill #1

## 2021-09-24 MED ORDER — CITALOPRAM HYDROBROMIDE 40 MG PO TABS
ORAL_TABLET | ORAL | 1 refills | Status: DC
  Filled 2021-09-24: qty 30, 30d supply, fill #0
  Filled 2021-10-09 – 2021-10-21 (×2): qty 30, 30d supply, fill #1

## 2021-09-24 MED ORDER — REXULTI 2 MG PO TABS
ORAL_TABLET | ORAL | 1 refills | Status: DC
  Filled 2021-09-24: qty 30, 30d supply, fill #0
  Filled 2021-10-22: qty 30, 30d supply, fill #1
  Filled ????-??-??: fill #1

## 2021-09-25 ENCOUNTER — Other Ambulatory Visit: Payer: Self-pay

## 2021-10-01 ENCOUNTER — Ambulatory Visit: Admit: 2021-10-01 | Discharge: 2021-10-02 | Payer: MEDICAID | Attending: Family | Primary: Family

## 2021-10-01 MED ADMIN — triamcinolone acetonide (KENALOG) injection 20 mg: 20 mg | INTRA_ARTICULAR | @ 17:00:00 | Stop: 2021-10-01

## 2021-10-01 NOTE — Unmapped (Signed)
Thank you for choosing [facility]!  We appreciate the opportunity to participate in your care. Please let us know if we can be of assistance your orthopaedic issues in the future.      If you have questions or concerns, please do not hesitate to contact us by [facility] or by calling [phone] to speak with one of our clinical support sports team members.      [facility] MyChart Website: [email or web address]        Appointment Scheduling: [phone]     Walk-in hours:          [facility]:  Monday - Friday 8 am - 4 pm  [address]  [address], [address]  [place], [place]  [address]        __________________________________________________________________________     Patient Specific Information: You received a corticosteroid injection to reduce pain and inflammation.  Please note that it can take up to 2 weeks for this injection to fully work.  While many people will feel relief sooner, please be patient.      The injection contained a corticosteroid and a numbing agent.  The numbing agent can last for 1-6 hours.  After this wears off you may have increased pain until the steroid has a chance to work.    What are some of the possible side effects of a steroid injection?    Common side effects:  temporarily elevated blood sugar (in diabetic patients) that can last a few days   flushing of the skin, especially the face  temporary rise in blood pressure  discoloration or atrophy of the skin at the injection site    Call your doctor at once if you have:  persistent worsening pain or swelling, fever;  blurred vision, tunnel vision, eye pain, or seeing halos around lights;  fast or slow heartbeats;  increased blood pressure that is associated with severe headache, blurred vision, pounding in your neck or ears, anxiety, nosebleed;  headaches, ringing in your ears, dizziness, nausea, vision problems, pain behind your eyes    This is not a complete list of side effects and others may occur. Call your provider for medical advice about side effects.     What other drugs may be affected after the injection?  Many drugs can interact with steroids. Not all possible interactions are listed here. Tell your doctor about all your current medicines and any you start or stop using, especially:  an antibiotic or antifungal medication;  birth control pills or hormone replacement therapy;  a blood thinner (warfarin, Coumadin, and others);  a diuretic or water pill;  insulin or oral diabetes medicine;  medicine to treat tuberculosis;  a nonsteroidal anti-inflammatory drug or NSAID (aspirin, ibuprofen, naproxen, diclofenac, indomethacin, Advil, Aleve, Celebrex, and many others); or  seizure medication.      You can resume your normal daily activities, but consider resting the injected area for the next few days.         __________________________________________________________________________

## 2021-10-01 NOTE — Unmapped (Signed)
SPORTS MEDICINE RETURN VISIT    ASSESSMENT AND PLAN     Problem List Items Addressed This Visit    None     Visit Diagnoses     Arthritis of right knee    -  Primary          Ultrasound was used to evaluate for possible effusion.  Very minimal fluid noted in the suprapatellar pouch.  Slight synovitis noted.  No aspiration completed.  Symptoms are consistent with flare of arthritis.  After reviewing risk, she elected to proceed with intra-articular steroid injection.  Please see procedure note.  I provided a home exercise program to work on on her own.    Return if symptoms worsen or fail to improve.          SUBJECTIVE     Chief Complaint:   Chief Complaint   Patient presents with   ??? Right Knee - Pain       History of Present Illness: 55 y.o. female who presents for atraumatic right knee pain that began about a week and a half ago.  No activity change.  Pain is all localized to the medial aspect of the knee.  She has noted swelling, subjective laxity.  No locking.  She also has psoriasis and no history of psoriatic arthritis diagnosis.    Past Medical History:   Past Medical History:   Diagnosis Date   ??? Anxiety    ??? Blepharitis    ??? Cancer (CMS-HCC)     endometrial 2007   ??? Diabetes mellitus (CMS-HCC)    ??? Diabetic retinopathy (CMS-HCC)     Mild NPDR w/ME, left eye   ??? Endometrial cancer (CMS-HCC) 2007   ??? Gastroparesis    ??? Heart disease    ??? High cholesterol    ??? Hypertension    ??? Liver disease    ??? Non-alcoholic fatty liver disease    ??? 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 Psoriasis    ??? Ptosis of eyelid, left     Left upper eyelid   ??? Reflux          OBJECTIVE     Physical Exam:  Vitals:   Wt Readings from Last 3 Encounters:   09/12/21 (!) 114.8 kg (253 lb)   05/17/21 (!) 120.2 kg (265 lb)   05/02/21 (!) 118.4 kg (261 lb)     Estimated body mass index is 43.43 kg/m?? as calculated from the following:    Height as of 09/12/21: 162.6 cm (5' 4).    Weight as of 09/12/21: 114.8 kg (253 lb).  Gen: Well-appearing female in no acute distress  MSK: Mild right knee effusion.  Tenderness palpation about medial joint line.  No tenderness palpation about lateral joint line.  Range of motion 0 to 100 degrees.  Stable to valgus varus stress.  Negative Lachman.  Negative posterior drawer.  Skin is warm and dry with multiple psoriatic plaques over bilateral lower extremities.    Imaging/other tests: Prior right knee x-rays Limited left knee views dated 03/19/2021 reviewed and show moderate medial compartment right knee osteoarthritis.      ADMINISTRATIVE     I have personally reviewed and interpreted the images (as available).  I have personally reviewed prior records and incorporated relevant information above (as available).    @SMIBILLING @    PROCEDURES     Lg Joint Inj: R knee on 10/01/2021 12:58 PM  Indications: pain and joint  swelling  Details: 22 G needle, anterolateral approach  Medications: 20 mg triamcinolone acetonide 10 mg/mL  Outcome: tolerated well, no immediate complications  Procedure, treatment alternatives, risks and benefits explained, specific risks discussed. Consent was given by the patient. Immediately prior to procedure a time out was called to verify the correct patient, procedure, equipment, support staff and site/side marked as required. Patient was prepped and draped in the usual sterile fashion.     Medical Care Team Attestation: All ProcDoc orders were read back and verbally confirmed with the procedure provider, including but not limited to patient name, medication name, dose, and route, before any actions were taken.  Self Attestation: I, the provider, prepared the medications and administered for this procedure as documented above.  Provider Attestation: The information documented by members of my medical care team was reviewed and verified for accuracy by me.             DME     DME ORDER:  Dx:  ,

## 2021-10-09 MED ORDER — CELECOXIB 200 MG CAPSULE
ORAL_CAPSULE | Freq: Two times a day (BID) | ORAL | 3 refills | 30.00000 days | Status: CP
Start: 2021-10-09 — End: 2021-11-08

## 2021-10-10 ENCOUNTER — Other Ambulatory Visit: Payer: Self-pay

## 2021-10-11 MED FILL — INVOKANA 300 MG TABLET: ORAL | 30 days supply | Qty: 30 | Fill #7

## 2021-10-14 MED FILL — LANTUS U-100 INSULIN 100 UNIT/ML SUBCUTANEOUS SOLUTION: SUBCUTANEOUS | 15 days supply | Qty: 10 | Fill #2

## 2021-10-14 MED FILL — TRULICITY 1.5 MG/0.5 ML SUBCUTANEOUS PEN INJECTOR: SUBCUTANEOUS | 28 days supply | Qty: 4 | Fill #4

## 2021-10-18 DIAGNOSIS — J029 Acute pharyngitis, unspecified: Principal | ICD-10-CM

## 2021-10-20 DIAGNOSIS — I10 Essential (primary) hypertension: Principal | ICD-10-CM

## 2021-10-20 DIAGNOSIS — E785 Hyperlipidemia, unspecified: Principal | ICD-10-CM

## 2021-10-20 MED ORDER — ASPIRIN 81 MG TABLET,DELAYED RELEASE
Freq: Every day | ORAL | 0 refills | 0 days
Start: 2021-10-20 — End: ?

## 2021-10-20 MED ORDER — SIMVASTATIN 20 MG TABLET
ORAL_TABLET | Freq: Every evening | ORAL | 1 refills | 90 days
Start: 2021-10-20 — End: 2022-10-20

## 2021-10-20 MED ORDER — LISINOPRIL 40 MG TABLET
ORAL_TABLET | Freq: Every day | ORAL | 1 refills | 90 days
Start: 2021-10-20 — End: 2022-10-20

## 2021-10-21 ENCOUNTER — Other Ambulatory Visit: Payer: Self-pay

## 2021-10-21 MED ORDER — ASPIRIN 81 MG TABLET,DELAYED RELEASE
ORAL_TABLET | Freq: Every day | ORAL | 3 refills | 90 days | Status: CP
Start: 2021-10-21 — End: ?
  Filled 2021-10-22: qty 90, 90d supply, fill #0

## 2021-10-21 NOTE — Unmapped (Signed)
The [facility] has made a third and final attempt to reach this patient to refill the following medication: Cosentyx.      We have been unable to leave messages on the following phone numbers: [phone] (VM full) and [phone] (number not in service) and have sent a MyChart message.    Dates contacted: 11/11, 11/15, 11/21  Last scheduled delivery: 10/21 for 28 day supply    The patient may be at risk of non-compliance with this medication. The patient should call the [facility] at [phone]  Option 4, then Option 2 (all other specialty patients) to refill medication.    Lanney Gins   [facility] Specialty Pharmacist

## 2021-10-22 ENCOUNTER — Other Ambulatory Visit: Payer: Self-pay

## 2021-10-22 MED ORDER — LISINOPRIL 40 MG TABLET
ORAL_TABLET | Freq: Every day | ORAL | 1 refills | 90 days | Status: CP
Start: 2021-10-22 — End: 2022-10-22
  Filled 2021-12-23: qty 90, 90d supply, fill #0

## 2021-10-22 MED ORDER — SIMVASTATIN 20 MG TABLET
ORAL_TABLET | Freq: Every evening | ORAL | 1 refills | 90 days | Status: CP
Start: 2021-10-22 — End: 2022-10-22
  Filled 2021-12-23: qty 90, 90d supply, fill #0

## 2021-10-22 MED FILL — PANTOPRAZOLE 40 MG TABLET,DELAYED RELEASE: ORAL | 30 days supply | Qty: 60 | Fill #2

## 2021-10-23 MED FILL — COSENTYX PEN 300 MG/2 PENS (150 MG/ML) SUBCUTANEOUS: 28 days supply | Qty: 2 | Fill #5

## 2021-10-23 NOTE — Unmapped (Signed)
Ashley Briggs reports no improvement in her psoriasis flare- she had previously been off of medication for a few months, then resumed for a few months before missing September's dose. I advised we don't typically reload unless a patient is off for an extended time due to the long half-life, but that I'd get Dr. Verne Spurr input. I encouraged her to reschedule her follow up as well.    [facility] Clinical Assessment & Refill Coordination Note    Ashley Briggs, DOB: 1966/01/21  Phone: [phone] (home)     All above HIPAA information was verified with patient.     Was a Nurse, learning disability used for this call? No    Specialty Medication(s):   Inflammatory Disorders: Cosentyx     Current Outpatient Medications   Medication Sig Dispense Refill   ??? albuterol HFA 90 mcg/actuation inhaler Inhale 2 puffs every six (6) hours as needed for shortness of breath. (Patient not taking: Reported on 09/12/2021) 8 g 1   ??? amitriptyline (ELAVIL) 10 MG tablet TAKE ONE TABLET BY MOUTH ONCE DAILY AT BEDTIME.     ??? aspirin (ECOTRIN) 81 MG tablet Take 1 tablet (81 mg total) by mouth daily. 90 tablet 3   ??? benzonatate (TESSALON PERLES) 100 MG capsule Take 1-2 capsules (100-200 mg total) by mouth Three (3) times a day as needed for cough. (Patient not taking: Reported on 09/12/2021) 40 capsule 1   ??? blood sugar diagnostic (ON CALL EXPRESS TEST STRIP) Strp Use to check blood sugar Three (3) times a day. 300 each 1   ??? blood-glucose meter Misc Use as directed by provider. 1 each 0   ??? brexpiprazole (REXULTI) 2 mg Tab Take 2 mg by mouth daily.     ??? canagliflozin (INVOKANA) 300 mg Tab tablet Take 1 tablet (300 mg total) by mouth every morning before breakfast. 90 tablet 3   ??? celecoxib (CELEBREX) 200 MG capsule Take 1 capsule (200 mg total) by mouth Two (2) times a day as needed for pain 60 capsule 3   ??? citalopram (CELEXA) 40 MG tablet Take 40 mg by mouth daily.     ??? clonazePAM (KLONOPIN) 0.5 MG tablet Take 0.5 mg by mouth daily.     ??? dulaglutide 1.5 mg/0.5 mL PnIj Inject the contents of 2 syringes (3 mg total) under the skin every seven (7) days. 4 mL 6   ??? ferrous sulfate (SLOW FE) 142 mg (45 mg iron) TbER Take 1 tablet (142 mg) by mouth two (2) times a day. 60 tablet 3   ??? HYDROcodone-chlorpheniramine polistirex (TUSSIONEX PENNKINETIC) 10-8 mg/5 mL ER suspension Take 5 mL by mouth every twelve (12) hours as needed for cough. (Patient not taking: Reported on 09/12/2021) 115 mL 0   ??? insulin glargine (LANTUS U-100 INSULIN) 100 unit/mL injection Inject 0.66 mL (66 Units total) under the skin nightly. 10 mL 4   ??? lancets 30 gauge Misc Use as directed to test once daily. 100 each 3   ??? lisinopriL (PRINIVIL,ZESTRIL) 40 MG tablet Take 1 tablet (40 mg total) by mouth in the morning. 90 tablet 1   ??? metFORMIN (GLUCOPHAGE-XR) 500 MG 24 hr tablet Take 2 tablets (1,000 mg total) by mouth in the morning and 2 tablets (1,000 mg total) in the evening. Take with meals. 360 tablet 1   ??? pantoprazole (PROTONIX) 40 MG tablet Take 1 tablet (40 mg total) by mouth Two (2) times a day. 180 tablet 1   ??? secukinumab (COSENTYX  PEN, 2 PENS,) 150 mg/mL PnIj injection Inject the contents of 2 pens (300 mg) under the skin every 4 weeks. 2 mL 12   ??? simvastatin (ZOCOR) 20 MG tablet Take 1 tablet (20 mg total) by mouth every evening. 90 tablet 1   ??? tolterodine (DETROL LA) 4 MG 24 hr capsule Take 1 capsule (4 mg total) by mouth daily. 90 capsule 2   ??? triamcinolone (KENALOG) 0.1 % cream Apply topically Two (2) times a day. 480 g 1     No current facility-administered medications for this visit.        Changes to medications: Ashley Briggs reports no changes at this time.    No Known Allergies    Changes to allergies: No    SPECIALTY MEDICATION ADHERENCE     Cosentyx - 0 left  Medication Adherence    Patient reported X missed doses in the last month: 0  Specialty Medication: Cosentyx          Specialty medication(s) dose(s) confirmed: Regimen is correct and unchanged. Are there any concerns with adherence? No    Adherence counseling provided? Not needed    CLINICAL MANAGEMENT AND INTERVENTION      Clinical Benefit Assessment:    Do you feel the medicine is effective or helping your condition? No    Clinical Benefit counseling provided? Reasonable expectations discussed: May take a few more doses to build up - will tag clinic to make sure they don't want to repeat load    Adverse Effects Assessment:    Are you experiencing any side effects? No    Are you experiencing difficulty administering your medicine? No    Quality of Life Assessment:    Quality of Life    Rheumatology  Oncology  Dermatology  1. What impact has your specialty medication had on the symptoms of your skin condition (i.e. itchiness, soreness, stinging)?: Some  2. What impact has your specialty medication had on your comfort level with your skin?: Some  Cystic Fibrosis          Have you discussed this with your provider? Not needed    Acute Infection Status:    Acute infections noted within Epic:  No active infections  Patient reported infection: None    Therapy Appropriateness:    Is therapy appropriate and patient progressing towards therapeutic goals? Yes, therapy is appropriate and should be continued    DISEASE/MEDICATION-SPECIFIC INFORMATION      For patients on injectable medications: Patient currently has 0 doses left.  Next injection is scheduled for asap (was due 11/18).    PATIENT SPECIFIC NEEDS     - Does the patient have any physical, cognitive, or cultural barriers? No    - Is the patient high risk? No    - Does the patient require a Care Management Plan? No     - Does the patient require physician intervention or other additional services (i.e. nutrition, smoking cessation, social work)? No      SHIPPING     Specialty Medication(s) to be Shipped:   Inflammatory Disorders: Cosentyx    Other medication(s) to be shipped: No additional medications requested for fill at this time     Changes to insurance: No    Delivery Scheduled: Yes, Expected medication delivery date: Wed, 11/23.     Medication will be delivered via Same Day Courier to the confirmed prescription address in [facility].    The patient will receive a drug information handout for each medication  shipped and additional FDA Medication Guides as required.  Verified that patient has previously received a Conservation officer, historic buildings and a Surveyor, mining.    The patient or caregiver noted above participated in the development of this care plan and knows that they can request review of or adjustments to the care plan at any time.      All of the patient's questions and concerns have been addressed.    Lanney Gins   [facility] Specialty Pharmacist

## 2021-10-23 NOTE — Unmapped (Signed)
Patient is requesting the following refill  Requested Prescriptions     Pending Prescriptions Disp Refills   ??? lisinopriL (PRINIVIL,ZESTRIL) 40 MG tablet 90 tablet 1     Sig: Take 1 tablet (40 mg total) by mouth in the morning.   ??? simvastatin (ZOCOR) 20 MG tablet 90 tablet 1     Sig: Take 1 tablet (20 mg total) by mouth every evening.       Recent Visits  Date Type Provider Dept   09/12/21 Office Visit Desmond Dike, NP [facility] [facility]   05/17/21 Office Visit Desmond Dike, NP [place] [facility]   05/02/21 Office Visit Deneise Lever, FNP [place] [facility]   03/13/21 Office Visit Desmond Dike, NP [place] [facility]   11/27/20 Office Visit Desmond Dike, NP [place] [facility]   Showing recent visits within past 365 days with a meds authorizing provider and meeting all other requirements  Future Appointments  Date Type Provider Dept   01/13/22 Appointment Desmond Dike, NP  [facility]   Showing future appointments within next 365 days with a meds authorizing provider and meeting all other requirements

## 2021-10-29 MED FILL — SLOW FE 142 MG (45 MG IRON) TABLET,EXTENDED RELEASE: ORAL | 30 days supply | Qty: 60 | Fill #1

## 2021-10-31 ENCOUNTER — Telehealth: Payer: Self-pay | Admitting: Pharmacy Technician

## 2021-10-31 NOTE — Telephone Encounter (Signed)
Patient has Medicaid with prescription drug coverage.  No longer meets [facility] eligibility criteria.  Pt. notified. [facility]   [address] [place], [place]  [address]   10/30/21    [place], [place]  [address]  Dear Abigail Molina:  This is to inform you that you are no longer eligible to receive medication assistance at [facility].  The reason(s) are:    _____Your total gross monthly household income exceeds 250% of the Federal Poverty Level.   _____Tangible assets (savings, checking, stocks/bonds, pension, retirement, etc.) exceeds our limit  _____You are eligible to receive benefits from a Medicare Part "D" plan __X__You have prescription insurance with Medicaid _____You are not an [facility] resident _____Failure to provide all requested documentation (proof of income information for 2022., and/or Patient Intake Application, [place], Contract, etc).    We regret that we are unable to help you at this time.  If your prescription coverage is terminated, please contact [facility], so that we may reassess your eligibility for our program.  If you have questions, we may be contacted at [phone].  Thank you,  [facility]

## 2021-11-04 MED FILL — LANTUS U-100 INSULIN 100 UNIT/ML SUBCUTANEOUS SOLUTION: SUBCUTANEOUS | 15 days supply | Qty: 10 | Fill #3

## 2021-11-05 ENCOUNTER — Other Ambulatory Visit: Payer: Self-pay

## 2021-11-08 ENCOUNTER — Other Ambulatory Visit: Payer: Self-pay

## 2021-11-11 DIAGNOSIS — E1169 Type 2 diabetes mellitus with other specified complication: Principal | ICD-10-CM

## 2021-11-11 DIAGNOSIS — U071 COVID-19: Principal | ICD-10-CM

## 2021-11-11 DIAGNOSIS — Z794 Long term (current) use of insulin: Principal | ICD-10-CM

## 2021-11-11 MED ORDER — HYDROCODONE 10 MG-CHLORPHENIRAMINE 8 MG/5 ML ORAL SUSP EXTEND.REL 12HR
Freq: Two times a day (BID) | ORAL | 0 refills | 12 days | PRN
Start: 2021-11-11 — End: ?

## 2021-11-11 MED ORDER — ALBUTEROL SULFATE HFA 90 MCG/ACTUATION AEROSOL INHALER
Freq: Four times a day (QID) | RESPIRATORY_TRACT | 1 refills | 0.00000 days | Status: CN | PRN
Start: 2021-11-11 — End: 2022-11-11
  Filled 2021-11-19: qty 18, 25d supply, fill #0

## 2021-11-11 MED ORDER — METFORMIN ER 500 MG TABLET,EXTENDED RELEASE 24 HR
ORAL_TABLET | Freq: Two times a day (BID) | ORAL | 1 refills | 90.00000 days | Status: CP
Start: 2021-11-11 — End: 2022-11-11
  Filled 2021-11-15: qty 360, 90d supply, fill #0

## 2021-11-12 ENCOUNTER — Emergency Department: Payer: Medicaid Other

## 2021-11-12 ENCOUNTER — Emergency Department
Admission: EM | Admit: 2021-11-12 | Discharge: 2021-11-12 | Disposition: A | Discharge status: Home / Self Care | Payer: Medicaid Other | Attending: Emergency Medicine | Admitting: Emergency Medicine

## 2021-11-12 ENCOUNTER — Other Ambulatory Visit: Payer: Self-pay

## 2021-11-12 ENCOUNTER — Encounter: Payer: Self-pay | Admitting: Emergency Medicine

## 2021-11-12 DIAGNOSIS — E119 Type 2 diabetes mellitus without complications: Secondary | ICD-10-CM | POA: Diagnosis not present

## 2021-11-12 DIAGNOSIS — Y9301 Activity, walking, marching and hiking: Secondary | ICD-10-CM | POA: Diagnosis not present

## 2021-11-12 DIAGNOSIS — M25561 Pain in right knee: Secondary | ICD-10-CM | POA: Insufficient documentation

## 2021-11-12 DIAGNOSIS — X509XXA Other and unspecified overexertion or strenuous movements or postures, initial encounter: Secondary | ICD-10-CM | POA: Insufficient documentation

## 2021-11-12 DIAGNOSIS — Z79899 Other long term (current) drug therapy: Secondary | ICD-10-CM | POA: Insufficient documentation

## 2021-11-12 DIAGNOSIS — Z7984 Long term (current) use of oral hypoglycemic drugs: Secondary | ICD-10-CM | POA: Insufficient documentation

## 2021-11-12 DIAGNOSIS — I10 Essential (primary) hypertension: Secondary | ICD-10-CM | POA: Diagnosis not present

## 2021-11-12 DIAGNOSIS — Z8542 Personal history of malignant neoplasm of other parts of uterus: Secondary | ICD-10-CM | POA: Diagnosis not present

## 2021-11-12 DIAGNOSIS — Z794 Long term (current) use of insulin: Secondary | ICD-10-CM | POA: Insufficient documentation

## 2021-11-12 DIAGNOSIS — Z7982 Long term (current) use of aspirin: Secondary | ICD-10-CM | POA: Diagnosis not present

## 2021-11-12 MED ORDER — HYDROCODONE 10 MG-CHLORPHENIRAMINE 8 MG/5 ML ORAL SUSP EXTEND.REL 12HR
Freq: Two times a day (BID) | ORAL | 0 refills | 12.00000 days | PRN
Start: 2021-11-12 — End: ?

## 2021-11-12 MED ORDER — ACETAMINOPHEN 500 MG PO TABS
1000.0000 mg | ORAL_TABLET | Freq: Once | ORAL | Status: AC
  Administered 2021-11-12: 1000 mg via ORAL
  Filled 2021-11-12: qty 2

## 2021-11-12 NOTE — ED Provider Notes (Signed)
[facility] Emergency Department Provider Note  ____________________________________________   Event Date/Time   First MD Initiated Contact with Patient 11/12/21 1346     (approximate)  I have reviewed the triage vital signs and the nursing notes.   HISTORY  Chief Complaint Knee Pain   HPI Abigail Molina is a 55 y.o. female with a past medical history of endometrial cancer, depression, DM, fatty liver disease, GERD, HTN, HDL and psoriasis who presents for assessment of right knee pain.  Patient states it started last night when she was walking her dog and her dog went one way and she "went the other way".  She states she felt a pop but did not feel any or noticed any dislocation.  She states she has been able to bear weight although this worsens the pain and she has been taking ibuprofen and Celebrex but does not feel this is helped much.  She denies any pain in the right hip, right ankle or any other pain associate with this incident.  No other recent sick symptoms including fevers, chills, cough, vomiting, diarrhea, burning with urination, new rash, or other pain redness or swelling anywhere else.  She states the pain is primarily around the front of the knee.  No posterior pain.  No other recent injuries or falls.  No other acute concerns at this time.         Past Medical History:  Diagnosis Date   Cancer [facility])    Endometrial cancer   Depression    Diabetes mellitus without complication ([place])    Fatty liver disease, nonalcoholic 1610   GERD (gastroesophageal reflux disease)    Hyperlipidemia    Hypertension    Overactive bladder    Psoriasis 2015    Patient Active Problem List   Diagnosis Date Noted   Abdominal pain 03/04/2018   Diabetes ([place]) 12/12/2015   Hyperlipidemia 12/12/2015   Hypertension 05/23/2015   Acid reflux 05/23/2015    Past Surgical History:  Procedure Laterality Date   ABDOMINAL HYSTERECTOMY  2007   CHOLECYSTECTOMY  2000     Prior to Admission medications   Medication Sig Start Date End Date Taking? Authorizing Provider  amitriptyline (ELAVIL) 10 MG tablet TAKE ONE TABLET BY MOUTH ONCE DAILY AT BEDTIME. 09/23/21     aspirin 81 MG tablet Take 81 mg by mouth daily.    [provider]  brexpiprazole (REXULTI) 2 MG TABS tablet Take 2 mg by mouth.    [provider]  brexpiprazole (REXULTI) 2 MG TABS tablet TAKE ONE TABLET BY MOUTH ONCE DAILY AT BEDTIME. 09/23/21     citalopram (CELEXA) 40 MG tablet Take 40 mg by mouth daily.     [provider]  citalopram (CELEXA) 40 MG tablet TAKE ONE TABLET BY MOUTH ONCE EVERY MORNING. 09/23/21     Dulaglutide (TRULICITY) 1.5 RU/0.4VW SOPN Inject 1.5 mg into the skin once a week. Thursdays    [provider]  fluconazole (DIFLUCAN) 150 MG tablet Take 1 tablet (150 mg total) by mouth daily. 09/03/21   Merlyn Lot, MD  insulin glargine (LANTUS) 100 UNIT/ML injection Inject 0.5 mLs (50 Units total) into the skin at bedtime. Inject 42 units once daily. Patient taking differently: Inject 66 Units into the skin at bedtime. 06/17/18   Doles-Johnson, Teah, NP  Insulin Pen Needle 32G X 6 MM MISC 1 Syringe by Does not apply route. Use with Victoza.    [provider]  lisinopril (ZESTRIL) 40 MG  tablet TAKE ONE TABLET BY MOUTH EVERY DAY 03/20/20   McGowan, Larene Beach A, PA-C  metFORMIN (GLUCOPHAGE) 1000 MG tablet TAKE ONE TABLET BY MOUTH 2 TIMES A DAY WITH A MEAL. 10/24/19   Zara Council A, PA-C  mometasone (ELOCON) 0.1 % ointment Apply topically daily. Apply as needed for psoriasis Patient not taking: Reported on 12/28/2020    [provider]  Multiple Vitamin (MULTIVITAMIN) tablet Take 1 tablet by mouth daily. Patient not taking: Reported on 12/28/2020    [provider]  naproxen sodium (ALEVE) 220 MG tablet Take 220 mg by mouth 2 (two) times daily as needed (pain).    [provider]  oxyCODONE (ROXICODONE) 5 MG  immediate release tablet 1 every 6-8 hours as needed for pain. Patient not taking: No sig reported 02/26/19   Letitia Neri L, PA-C  pantoprazole (PROTONIX) 40 MG tablet TAKE ONE TABLET BY MOUTH EVERY DAY 10/24/19   McGowan, Larene Beach A, PA-C  simvastatin (ZOCOR) 20 MG tablet TAKE ONE TABLET BY MOUTH EVERY EVENING 10/24/19   McGowan, Larene Beach A, PA-C  tolterodine (DETROL LA) 2 MG 24 hr capsule Take 2 mg by mouth daily.    [provider]  VICTOZA 18 MG/3ML SOPN Inject 1.2 mg once daily Patient not taking: Reported on 12/28/2020 12/03/17   Doles-Johnson, Teah, NP    Allergies Patient has no known allergies.  Family History  Problem Relation Age of Onset   Diabetes Mother    Hypertension Mother    Depression Mother     Social History Social History   Tobacco Use   Smoking status: Never   Smokeless tobacco: Never  Vaping Use   Vaping Use: Never used  Substance Use Topics   Alcohol use: Yes    Comment:  Rarely. One or two drinks a year    Drug use: No    Review of Systems  Review of Systems  Constitutional:  Negative for chills and fever.  HENT:  Negative for sore throat.   Eyes:  Negative for pain.  Respiratory:  Negative for cough and stridor.   Cardiovascular:  Negative for chest pain.  Gastrointestinal:  Negative for vomiting.  Genitourinary:  Negative for dysuria.  Musculoskeletal:  Positive for joint pain (R knee).  Skin:  Negative for rash.  Neurological:  Negative for seizures, loss of consciousness and headaches.  Psychiatric/Behavioral:  Negative for suicidal ideas.   All other systems reviewed and are negative.    ____________________________________________   PHYSICAL EXAM:  VITAL SIGNS: ED Triage Vitals  Enc Vitals Group     BP 11/12/21 1310 127/63     Pulse Rate 11/12/21 1310 92     Resp 11/12/21 1310 18     Temp 11/12/21 1310 98.3 F (36.8 C)     Temp Source 11/12/21 1310 Oral     SpO2 11/12/21 1310 95 %     Weight --      Height --       Head Circumference --      Peak Flow --      Pain Score 11/12/21 1217 5     Pain Loc --      Pain Edu? --      Excl. in [place]? --    Vitals:   11/12/21 1310  BP: 127/63  Pulse: 92  Resp: 18  Temp: 98.3 F (36.8 C)  SpO2: 95%   Physical Exam Vitals and nursing note reviewed.  Constitutional:      General: She is  not in acute distress.    Appearance: She is well-developed.  HENT:     Head: Normocephalic and atraumatic.     Right Ear: External ear normal.     Left Ear: External ear normal.     Nose: Nose normal.  Eyes:     Conjunctiva/sclera: Conjunctivae normal.  Cardiovascular:     Rate and Rhythm: Normal rate and regular rhythm.     Heart sounds: No murmur heard. Pulmonary:     Effort: Pulmonary effort is normal. No respiratory distress.     Breath sounds: Normal breath sounds.  Abdominal:     Palpations: Abdomen is soft.     Tenderness: There is no abdominal tenderness.  Musculoskeletal:        General: No swelling.     Cervical back: Neck supple.  Skin:    General: Skin is warm and dry.     Capillary Refill: Capillary refill takes less than 2 seconds.  Neurological:     Mental Status: She is alert.  Psychiatric:        Mood and Affect: Mood normal.    There are scattered erythematous plaques over the bilateral extremities consistent with patient's history of psoriasis.  She is able to flex and extend the right knee although somewhat limited on full range secondary to pain.  Strength is also slightly decreased compared to the left secondary to pain.  She has full range of motion and strength of the bilateral ankles and hips.  Sensation is intact to light touch throughout bilateral lower extremities.  2+ DP pulses.  There is no large joint effusion or posterior tenderness compartments are soft in the posterior aspect of the knee.  No significant laxity on anterior posterior medial lateral drawer testing. ____________________________________________   LABS (all  labs ordered are listed, but only abnormal results are displayed)  Labs Reviewed - No data to display ____________________________________________  EKG  ____________________________________________  RADIOLOGY  ED MD interpretation: Plain film of the right knee shows no fracture, dislocation, effusion but there are some loose bodies in the posterior knee.  There are some spurring seen as well.  Official radiology report(s): DG Knee Complete 4 Views Right  Result Date: 11/12/2021 CLINICAL DATA:  Right medial knee pain Tweaked right knee while walking dog yesterday EXAM: RIGHT KNEE - COMPLETE 4+ VIEW COMPARISON:  None. FINDINGS: No fracture or dislocation. No significant knee joint effusion. Mild joint space loss and spurring seen in the medial, lateral, and patellofemoral compartments. Multiple loose bodies noted in the posterior knee joint. IMPRESSION: No acute abnormality of the right knee. Electronically Signed   By: Miachel Roux M.D.   On: 11/12/2021 12:54    ____________________________________________   PROCEDURES  Procedure(s) performed (including Critical Care):  Procedures   ____________________________________________   INITIAL IMPRESSION / ASSESSMENT AND PLAN / ED COURSE      Patient presents with above-stated history exam for assessment of right knee pain she sustained from any what sounds like twisting injury yesterday evening.  She has been ambulating on the knee without significant difficulty although feels her pain is not well controlled on Celebrex ibuprofen.  On arrival she was afebrile hemodynamically stable.  Plain film shows no acute fracture dislocation patient is neurovascularly intact.  At this time I below suspicion for arterial injury for compartment syndrome, DVT or acute infectious process.  I suspect a ligamentous injury given patient is able to ambulate with stable vitals and otherwise reassuring exam I think she is stable for  discharge with  outpatient orthopedic follow-up.  Advised her to choose 1 NSAID and stick with this and not do double NSAIDs.  I will also advised her to add Tylenol to her regimen to place in the immobilizer.  She can bear weight as tolerated and must return immediately if she experiences any new tingling numbness or heaviness in the lower leg or foot.  Discharged stable condition.  Stroke return precautions advised and discussed.      ____________________________________________   FINAL CLINICAL IMPRESSION(S) / ED DIAGNOSES  Final diagnoses:  Acute pain of right knee    Medications  acetaminophen (TYLENOL) tablet 1,000 mg (1,000 mg Oral Given 11/12/21 1409)     ED Discharge Orders     None        Note:  This document was prepared using Dragon voice recognition software and may include unintentional dictation errors.    Lucrezia Starch, MD 11/12/21 [phone]

## 2021-11-12 NOTE — ED Triage Notes (Signed)
Pt reports that she was walking her dog and they went one way and she went the other. She reports that her right knee is hurting her. She is ambulatory and able to bear weight

## 2021-11-13 ENCOUNTER — Ambulatory Visit: Admit: 2021-11-13 | Discharge: 2021-11-14 | Payer: PRIVATE HEALTH INSURANCE

## 2021-11-13 ENCOUNTER — Ambulatory Visit: Admit: 2021-11-13 | Discharge: 2021-11-14 | Payer: PRIVATE HEALTH INSURANCE | Attending: Family | Primary: Family

## 2021-11-13 MED ADMIN — triamcinolone acetonide (KENALOG) injection 20 mg: 20 mg | INTRA_ARTICULAR | @ 15:00:00 | Stop: 2021-11-13

## 2021-11-13 NOTE — Unmapped (Addendum)
Thank you for choosing [facility]!  We appreciate the opportunity to participate in your care. Please let us know if we can be of assistance your orthopaedic issues in the future.      If you have questions or concerns, please do not hesitate to contact us by [facility] or by calling [phone] to speak with one of our clinical support sports team members.      [facility] MyChart Website: [email or web address]        Appointment Scheduling: [phone]     Walk-in hours:          [facility]:  Monday - Friday 8 am - 4 pm  [address]  [address] Floor, [address]  [place], [place]  [address]        __________________________________________________________________________     Patient Specific Information: I recommend discussing having elective surgery with your primary care provider and obtaining preoperative clearance as well.  You would want to have a dental cleaning prior to knee surgery as well.  Your hemoglobin A1c needs to be under 7 and your BMI under 40 as well.    You received a corticosteroid injection to reduce pain and inflammation.  Please note that it can take up to 2 weeks for this injection to fully work.  While many people will feel relief sooner, please be patient.      The injection contained a corticosteroid and a numbing agent.  The numbing agent can last for 1-6 hours.  After this wears off you may have increased pain until the steroid has a chance to work.    What are some of the possible side effects of a steroid injection?    Common side effects:  temporarily elevated blood sugar (in diabetic patients) that can last a few days   flushing of the skin, especially the face  temporary rise in blood pressure  discoloration or atrophy of the skin at the injection site    Call your doctor at once if you have:  persistent worsening pain or swelling, fever;  blurred vision, tunnel vision, eye pain, or seeing halos around lights;  fast or slow heartbeats;  increased blood pressure that is associated with severe headache, blurred vision, pounding in your neck or ears, anxiety, nosebleed;  headaches, ringing in your ears, dizziness, nausea, vision problems, pain behind your eyes    This is not a complete list of side effects and others may occur. Call your provider for medical advice about side effects.     What other drugs may be affected after the injection?  Many drugs can interact with steroids. Not all possible interactions are listed here. Tell your doctor about all your current medicines and any you start or stop using, especially:  an antibiotic or antifungal medication;  birth control pills or hormone replacement therapy;  a blood thinner (warfarin, Coumadin, and others);  a diuretic or water pill;  insulin or oral diabetes medicine;  medicine to treat tuberculosis;  a nonsteroidal anti-inflammatory drug or NSAID (aspirin, ibuprofen, naproxen, diclofenac, indomethacin, Advil, Aleve, Celebrex, and many others); or  seizure medication.      You can resume your normal daily activities, but consider resting the injected area for the next few days.         __________________________________________________________________________

## 2021-11-13 NOTE — Unmapped (Signed)
SPORTS MEDICINE RETURN VISIT    ASSESSMENT AND PLAN     Problem List Items Addressed This Visit    None  Visit Diagnoses     Arthritis of right knee    -  Primary    Relevant Orders    XR Knee 4 Or More Views Right    Right knee injury, initial encounter        Relevant Orders    XR Knee 4 Or More Views Right          We reviewed x-rays and discussed intra-articular steroid injection to help with increase in pain related to osteoarthritis and recent injury.  She elected to proceed with injection today.  I recommended discontinuing the knee immobilizer and walking as able.    Would also like to discuss total knee arthroplasty.  I recommended discussing with primary care provider, dentist and any other specialist that she sees regularly.  I also provided her with requirements of BMI and hemoglobin A1c.    No follow-ups on file.    Procedure(s):  See note      SUBJECTIVE     Chief Complaint:   Chief Complaint   Patient presents with   ??? Right Knee - Injury     Right knee arthritis chronic  Injury on Monday night when taking dog out and stepped down and all weight went on right knee and felt like something popped  Feels unstable   Catches on stairs.  Lives on 2nd floor so stairs daily  Intense pain that makes nauseas at times  Pain down shin now.          History of Present Illness: 55 y.o. female who presents for right knee pain that began on Monday after taking her dog out when she forcibly hyperextended her knee.  She has baseline arthritis pain but this increased her pain.  Most of her pain is medial and does radiate down the leg.  She is unsure of any swelling.  She was seen on the date of injury at an outside hospital and placed in a knee immobilizer.    Past Medical History:   Past Medical History:   Diagnosis Date   ??? Anxiety    ??? Blepharitis    ??? Cancer (CMS-HCC)     endometrial 2007   ??? Diabetes mellitus (CMS-HCC)    ??? Diabetic retinopathy (CMS-HCC)     Mild NPDR w/ME, left eye   ??? Endometrial cancer (CMS-HCC) 2007   ??? Gastroparesis    ??? Heart disease    ??? High cholesterol    ??? Hypertension    ??? Liver disease    ??? Non-alcoholic fatty liver disease    ??? 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 Psoriasis    ??? Ptosis of eyelid, left     Left upper eyelid   ??? Reflux          OBJECTIVE     Physical Exam:  Vitals:   Wt Readings from Last 3 Encounters:   09/12/21 (!) 114.8 kg (253 lb)   05/17/21 (!) 120.2 kg (265 lb)   05/02/21 (!) 118.4 kg (261 lb)     Estimated body mass index is 43.43 kg/m?? as calculated from the following:    Height as of 09/12/21: 162.6 cm (5' 4).    Weight as of 09/12/21: 114.8 kg (253 lb).  Gen: Well-appearing female in no acute distress  MSK: Right knee with  small effusion.  Tenderness palpation about medial joint line.  No tenderness palpation about lateral joint line.  No tenderness palpation in proximal tibia or Pez anserine bursa.  Range of motion 0 to 100 degrees.  Stable to valgus varus stress.  Negative Lachman.  Negative posterior drawer.  Multiple erythematous plaques consistent with known psoriasis on lower extremities.  Neurovascularly intact distally    Imaging/other tests: X-rays of the right knee taken today and reviewed with patient exam room show:  Moderate medial femorotibial predominant tricompartmental right knee osteoarthrosis with chondrocalcinosis and intra-articular bodies.    ADMINISTRATIVE     I have personally reviewed and interpreted the images (as available).  I have personally reviewed prior records and incorporated relevant information above (as available).    @SMIBILLING @    PROCEDURES     Lg Joint Inj: R knee on 11/13/2021 10:00 AM  Indications: pain  Details: 22 G needle, anterolateral approach  Medications: 20 mg triamcinolone acetonide 10 mg/mL  Outcome: tolerated well, no immediate complications  Procedure, treatment alternatives, risks and benefits explained, specific risks discussed. Consent was given by the patient. Immediately prior to procedure a time out was called to verify the correct patient, procedure, equipment, support staff and site/side marked as required. Patient was prepped and draped in the usual sterile fashion.     Medical Care Team Attestation: All ProcDoc orders were read back and verbally confirmed with the procedure provider, including but not limited to patient name, medication name, dose, and route, before any actions were taken.  Self Attestation: I, the provider, prepared the medications and administered for this procedure as documented above.  Provider Attestation: The information documented by members of my medical care team was reviewed and verified for accuracy by me.             DME     DME ORDER:  Dx:  ,

## 2021-11-13 NOTE — Unmapped (Signed)
Needs office visit.

## 2021-11-19 MED FILL — ASPIRIN 81 MG TABLET,DELAYED RELEASE: ORAL | 90 days supply | Qty: 90 | Fill #1

## 2021-11-19 NOTE — Unmapped (Signed)
[facility] has made a second and final attempt to reach this patient to refill the following medication:Cosentyx.      We have left voicemails on the following phone numbers: [phone] and have been unable to leave messages on the following phone numbers: [phone] (out of service).*Sent text to [phone] on 12/15 and 12/20*    Dates contacted: 12/15 and 12/20  Last scheduled delivery: 11/23    The patient may be at risk of non-compliance with this medication. The patient should call the [facility] at [phone]  Option 4, then Option 2 (all other specialty patients) to refill medication.    Mandel Seiden D Administrator Shared [facility] Specialty Technician

## 2021-11-20 DIAGNOSIS — R1013 Epigastric pain: Principal | ICD-10-CM

## 2021-11-20 MED ORDER — PANTOPRAZOLE 40 MG TABLET,DELAYED RELEASE
ORAL_TABLET | Freq: Two times a day (BID) | ORAL | 1 refills | 90 days | Status: CP
Start: 2021-11-20 — End: ?
  Filled 2021-11-19: qty 60, 30d supply, fill #3
  Filled 2021-12-23: qty 180, 90d supply, fill #0

## 2021-11-21 DIAGNOSIS — R1010 Upper abdominal pain, unspecified: Principal | ICD-10-CM

## 2021-11-21 MED FILL — INVOKANA 300 MG TABLET: ORAL | 30 days supply | Qty: 30 | Fill #8

## 2021-11-21 NOTE — Unmapped (Signed)
Patient is requesting the following refill  Requested Prescriptions     Pending Prescriptions Disp Refills   ??? pantoprazole (PROTONIX) 40 MG tablet 180 tablet 1     Sig: Take 1 tablet (40 mg total) by mouth Two (2) times a day.       Recent Visits  Date Type Provider Dept   09/12/21 Office Visit Desmond Dike, NP [facility]   05/17/21 Office Visit Desmond Dike, NP [facility]   05/02/21 Office Visit Deneise Lever, FNP [facility]   03/13/21 Office Visit Desmond Dike, NP [facility]   11/27/20 Office Visit Desmond Dike, NP [facility]   Showing recent visits within past 365 days with a meds authorizing provider and meeting all other requirements  Future Appointments  Date Type Provider Dept   01/13/22 Appointment Desmond Dike, NP [facility] [facility]   Showing future appointments within next 365 days with a meds authorizing provider and meeting all other requirements       Labs: Not applicable this refill

## 2021-11-26 MED FILL — LANTUS U-100 INSULIN 100 UNIT/ML SUBCUTANEOUS SOLUTION: SUBCUTANEOUS | 15 days supply | Qty: 10 | Fill #4

## 2021-11-26 NOTE — Unmapped (Signed)
[facility] Refill Coordination Note    Specialty Medication(s) to be Shipped:   Inflammatory Disorders: Cosentyx    Other medication(s) to be shipped: lantus'     Ashley Briggs, DOB: 14-Feb-1966  Phone: [phone] (home)       All above HIPAA information was verified with patient.     Was a Nurse, learning disability used for this call? No    Completed refill call assessment today to schedule patient's medication shipment from the [facility] [phone]).  All relevant notes have been reviewed.     Specialty medication(s) and dose(s) confirmed: Regimen is correct and unchanged.   Changes to medications: Chrystel reports no changes at this time.  Changes to insurance: No  New side effects reported not previously addressed with a pharmacist or physician: None reported  Questions for the pharmacist: No    Confirmed patient received a Conservation officer, historic buildings and a Surveyor, mining with first shipment. The patient will receive a drug information handout for each medication shipped and additional FDA Medication Guides as required.       DISEASE/MEDICATION-SPECIFIC INFORMATION        For patients on injectable medications: Patient currently has 0 doses left.  Next injection is scheduled for 11/28/21.    SPECIALTY MEDICATION ADHERENCE     Medication Adherence    Patient reported X missed doses in the last month: 0  Specialty Medication: Cosentyx  Patient is on additional specialty medications: No              Were doses missed due to medication being on hold? No        REFERRAL TO PHARMACIST     Referral to the pharmacist: Not needed      [facility]     Shipping address confirmed in Epic.     Delivery Scheduled: Yes, Expected medication delivery date: 11/27/21.     Medication will be delivered via Same Day Courier to the prescription address in Epic WAM.    [place] A Sanad Fearnow   [facility] [facility] Shared [facility] Specialty Technician

## 2021-11-27 MED FILL — COSENTYX PEN 300 MG/2 PENS (150 MG/ML) SUBCUTANEOUS: 28 days supply | Qty: 2 | Fill #6

## 2021-11-28 ENCOUNTER — Other Ambulatory Visit: Payer: Self-pay

## 2021-12-09 MED FILL — TRULICITY 1.5 MG/0.5 ML SUBCUTANEOUS PEN INJECTOR: SUBCUTANEOUS | 28 days supply | Qty: 4 | Fill #5

## 2021-12-15 DIAGNOSIS — Z794 Long term (current) use of insulin: Principal | ICD-10-CM

## 2021-12-15 DIAGNOSIS — E1169 Type 2 diabetes mellitus with other specified complication: Principal | ICD-10-CM

## 2021-12-15 MED ORDER — INSULIN GLARGINE (U-100) 100 UNIT/ML SUBCUTANEOUS SOLUTION
Freq: Every evening | SUBCUTANEOUS | 4 refills | 15.00000 days
Start: 2021-12-15 — End: 2022-12-13

## 2021-12-16 MED ORDER — INSULIN GLARGINE (U-100) 100 UNIT/ML SUBCUTANEOUS SOLUTION
Freq: Every evening | SUBCUTANEOUS | 1 refills | 90.00000 days | Status: CP
Start: 2021-12-16 — End: 2022-12-14
  Filled 2021-12-17: qty 20, 30d supply, fill #0

## 2021-12-17 ENCOUNTER — Ambulatory Visit: Admit: 2021-12-17 | Discharge: 2021-12-18 | Payer: PRIVATE HEALTH INSURANCE

## 2021-12-17 DIAGNOSIS — Z79899 Other long term (current) drug therapy: Principal | ICD-10-CM

## 2021-12-17 DIAGNOSIS — L409 Psoriasis, unspecified: Principal | ICD-10-CM

## 2021-12-17 MED ORDER — USTEKINUMAB 90 MG/ML SUBCUTANEOUS SYRINGE
SUBCUTANEOUS | 0 refills | 0.00000 days | Status: CP
Start: 2021-12-17 — End: ?
  Filled 2021-12-30: qty 1, 28d supply, fill #0

## 2021-12-17 MED ORDER — CLOBETASOL 0.05 % TOPICAL OINTMENT
Freq: Two times a day (BID) | TOPICAL | 3 refills | 0.00000 days | Status: CP
Start: 2021-12-17 — End: 2022-12-17
  Filled 2021-12-23: qty 60, 14d supply, fill #0

## 2021-12-17 NOTE — Unmapped (Addendum)
Gabapentin is the diabetes medicine we talked about today.    I have sent a new medication called Stelara to [facility] specialty pharmacy. Continue cosentyx until you get the new medication.    Apply clobetasol ointment twice a day to areas of psoriasis. Can wrap with saran wrap to help medicine soak in.

## 2021-12-17 NOTE — Unmapped (Signed)
Dermatology Note     Assessment and Plan:      Plaque psoriasis, trunk and extremities, ~20% BSA, not well-controlled on Cosentyx  - on Cosentyx, started in 09/2019 with improvement, then stopped for about 4-5 months and restarted in May 2022 without loading doses. No improvement since restarting Cosentyx  - discussed that after being on maintenance Cosentyx for over 6 months without improvement, there would likely be minimal benefit to adding repeat loading doses of Cosentyx  - discussed management strategies including methotrexate vs Stelara vs Skyrizi, jointly elected to start Stelara  - start Stelara 90 mg/mL under the skin on weeks 0, week 4, and then every 12 weeks, sent to [facility] pharmacy  - continue Consentyx until Stelara is approved  - start clobetasol 0.05% ointment BID to affected areas as needed, discussed occlusion at night to help with absorption   - has known osteoarthritis in knees, no other joint pains     High risk medication use, Cosentyx, planning to switch to Stelara  - quantiferon gold negative in 03/2021, will recheck today   - hepatitis panel negative in 09/2019    The patient was advised to call for an appointment should any new, changing, or symptomatic lesions develop.     RTC: Return in about 3 months (around 03/17/2022) for psoriasis. or sooner as needed   _________________________________________________________________      Chief Complaint     Chief Complaint   Patient presents with   ??? Psoriasis     MEDICATION CHECK-NEED BOOSTERS NOT WORKING WELL       HPI     Ashley Briggs is a 56 y.o. female who presents as a returning patient (last seen 04/09/2021) to [facility] for follow up of psoriasis. At last visit was restarted on Cosentyx. This was initially started on 09/2019 and then the patient was lost to follow-up. Medication was restarted at last visit in May 2022.    Today notes that her psoriasis has not improved since restarting Consetyx. Tolerating medication well. Using triamcinolone. Has involvement of legs, arms, buttocks, trunk, and scalp. Endorses joint pains in knees, awaiting replacement. No other joint pains.     The patient denies any other new or changing lesions or areas of concern.     Pertinent Past Medical History     No history of skin cancer    History of endometrial cancer  Diabetes type 2 on insulin  Hypertension  Hyperlipidemia  Coronary artery disease  Non-alcoholic fatty liver disease  Hepatic steatosis    Family History:   Negative for melanoma    Past Medical History, Family History, Social History, Medication List, Allergies, and Problem List were reviewed in the rooming section of Epic.     ROS: Other than symptoms mentioned in the HPI, no fevers, chills, or other skin complaints    Physical Examination     GENERAL: Well-appearing female in no acute distress, resting comfortably.  NEURO: Alert and oriented, answers questions appropriately  PSYCH: Normal mood and affect  SKIN (Full Skin Exam): Examination of the face, eyelids, lips, nose, ears, neck, chest, abdomen, back, arms, legs, hands, feet, palms, soles, nails was performed  - multiple well-demarcated erythematous plaques with thick silvery scale on elbows, lower back, buttocks, and lower extremities  - no nail changes    All areas not commented on are within normal limits or unremarkable                  (  Approved Template 08/13/2020)

## 2021-12-19 ENCOUNTER — Other Ambulatory Visit: Payer: Self-pay

## 2021-12-19 MED ORDER — TOLTERODINE ER 4 MG CAPSULE,EXTENDED RELEASE 24 HR
ORAL_CAPSULE | Freq: Every day | ORAL | 2 refills | 90 days | Status: CP
Start: 2021-12-19 — End: 2022-12-19
  Filled 2021-12-23: qty 90, 90d supply, fill #0

## 2021-12-19 MED ORDER — CITALOPRAM HYDROBROMIDE 40 MG PO TABS
ORAL_TABLET | ORAL | 2 refills | Status: DC
  Filled 2021-12-19: qty 30, 30d supply, fill #0

## 2021-12-19 MED ORDER — REXULTI 2 MG PO TABS
ORAL_TABLET | ORAL | 2 refills | Status: DC
  Filled 2021-12-19: qty 30, 30d supply, fill #0

## 2021-12-19 MED ORDER — AMITRIPTYLINE HCL 10 MG PO TABS
ORAL_TABLET | ORAL | 2 refills | Status: DC
  Filled 2021-12-19: qty 30, 30d supply, fill #0

## 2021-12-19 NOTE — Unmapped (Signed)
[facility] Refill Coordination Note    Specialty Medication(s) to be Shipped:   Inflammatory Disorders: Cosentyx    Other medication(s) to be shipped: simvastatin, slow fe, tolterodine, ventolin, clobetasol oint, invokana, lisinpril and pantoprazole     Ashley Briggs, DOB: 25-Mar-1966  Phone: [phone] (home)       All above HIPAA information was verified with patient.     Was a Nurse, learning disability used for this call? No    Completed refill call assessment today to schedule patient's medication shipment from the [facility] [phone]).  All relevant notes have been reviewed.     Specialty medication(s) and dose(s) confirmed: Regimen is correct and unchanged.   Changes to medications: Ashley Briggs reports no changes at this time.  Changes to insurance: No  New side effects reported not previously addressed with a pharmacist or physician: None reported  Questions for the pharmacist: No    Confirmed patient received a Conservation officer, historic buildings and a Surveyor, mining with first shipment. The patient will receive a drug information handout for each medication shipped and additional FDA Medication Guides as required.       DISEASE/MEDICATION-SPECIFIC INFORMATION        For patients on injectable medications: Patient currently has 0 doses left.  Next injection is scheduled for 12/26/2021.    SPECIALTY MEDICATION ADHERENCE     Medication Adherence    Patient reported X missed doses in the last month: 0  Specialty Medication: COSENTYX  Patient is on additional specialty medications: No        Were doses missed due to medication being on hold? No    REFERRAL TO PHARMACIST     Referral to the pharmacist: Not needed      [facility]     Shipping address confirmed in Epic.     Delivery Scheduled: Yes, Expected medication delivery date: 12/23/2021.     Medication will be delivered via Same Day Courier to the prescription address in Epic WAM.    Ashley Briggs   [facility] Specialty Technician

## 2021-12-19 NOTE — Unmapped (Signed)
Patient is requesting the following refill  Requested Prescriptions     Pending Prescriptions Disp Refills   ??? tolterodine (DETROL LA) 4 MG 24 hr capsule 90 capsule 2     Sig: Take 1 capsule (4 mg total) by mouth daily.       Recent Visits  Date Type Provider Dept   09/12/21 Office Visit Desmond Dike, NP [facility]   05/17/21 Office Visit Desmond Dike, NP [facility]   05/02/21 Office Visit Deneise Lever, FNP [facility]   03/13/21 Office Visit Desmond Dike, NP [facility]   Showing recent visits within past 365 days with a meds authorizing provider and meeting all other requirements  Future Appointments  Date Type Provider Dept   01/13/22 Appointment Desmond Dike, NP [facility]   Showing future appointments within next 365 days with a meds authorizing provider and meeting all other requirements

## 2021-12-23 MED FILL — SLOW FE 142 MG (45 MG IRON) TABLET,EXTENDED RELEASE: ORAL | 30 days supply | Qty: 60 | Fill #2

## 2021-12-23 MED FILL — INVOKANA 300 MG TABLET: ORAL | 30 days supply | Qty: 30 | Fill #9

## 2021-12-23 MED FILL — VENTOLIN HFA 90 MCG/ACTUATION AEROSOL INHALER: RESPIRATORY_TRACT | 25 days supply | Qty: 18 | Fill #1

## 2021-12-23 MED FILL — COSENTYX PEN 300 MG/2 PENS (150 MG/ML) SUBCUTANEOUS: 28 days supply | Qty: 2 | Fill #7

## 2021-12-24 NOTE — Unmapped (Signed)
[facility] Specialty Medication Onboarding    Specialty Medication: Stelara 90mg /ml syringe  Prior Authorization: Approved   Financial Assistance: No - copay  <$25  Final Copay/Day Supply: $4 / 28 (load)                                          $4 / 84 (maintenance)    Insurance Restrictions: None     Notes to Pharmacist:     The triage team has completed the benefits investigation and has determined that the patient is able to fill this medication at [facility]. Please contact the patient to complete the onboarding or follow up with the prescribing physician as needed.

## 2021-12-25 NOTE — Unmapped (Unsigned)
[facility]   Patient Onboarding/Medication Counseling    Ashley Briggs is a 56 y.o. female with psoriasis who I am counseling today on initiation of therapy.  I am speaking to the patient.    Was a Nurse, learning disability used for this call? No    Verified patient's date of birth / HIPAA.    Specialty medication(s) to be sent: Inflammatory Disorders: Stelara      Non-specialty medications/supplies to be sent: Lantus Solostar pens, pen needles (both requested)      Medications not needed at this time: na         Stelara (ustekinumab)    Medication & Administration     Dosage: Plaque psoriasis (over 12yo and more than 100kg): Inject 90mg  under the skin at weeks 0 and 4, then every 12 weeks thereafter    Lab tests required prior to treatment initiation:  ??? Tuberculosis: Tuberculosis screening resulted in a non-reactive Quantiferon TB Gold assay.    Administration:     Prefilled syringe  1. Gather all supplies needed for injection on a clean, flat working surface: medication syringe(s) removed from packaging, alcohol swab, sharps container, etc.  2. Look at the medication label - look for correct medication, correct dose, and check the expiration date  3. Look at the medication - the liquid in the syringe should appear clear and colorless to slightly yellow, you may see a few white particles  4. Lay the syringe on a flat surface and allow it to warm up to room temperature for at least 15-30 minutes  5. Select injection site - you can use the front of your thigh or your belly (but not the area 2 inches around your belly button); if someone else is giving you the injection you can also use your upper arm in the skin covering your triceps muscle or in the buttocks  6. Prepare injection site - wash your hands and clean the skin at the injection site with an alcohol swab and let it air dry, do not touch the injection site again before the injection  7. Pull off the needle safety cap, do not remove until immediately prior to injection  8. Pinch the skin - with your hand not holding the syringe pinch up a fold of skin at the injection site using your forefinger and thumb  9. Insert the needle into the fold of skin at about a 45 degree angle - it's best to use a quick dart-like motion  10. Push the plunger down slowly as far as it will go until the syringe is empty, if the plunger is not fully depressed the needle shield will not extend to cover the needle when it is removed, hold the syringe in place for a full 5 seconds  11. Check that the syringe is empty and keep pressing down on the plunger while you pull the needle out at the same angle as inserted; after the needle is removed completely from the skin, release the plunger allowing the needle shield to activate and cover the used needle  12. Dispose of the used syringe immediately in your sharps disposal container, do not attempt to recap the needle prior to disposing  13. If you see any blood at the injection site, press a cotton ball or gauze on the site and maintain pressure until the bleeding stops, do not rub the injection site      Adherence/Missed dose instructions:  If your injection is given more than 7-10 days after  your scheduled injection date - consult your pharmacist for additional instructions on how to adjust your dosing schedule.    Goals of Therapy     Plaque Psoriasis  ??? Minimize areas of skin involvement (% BSA)  ??? Avoidance of long term glucocorticoid use  ??? Maintenance of effective psychosocial functioning      Side Effects & Monitoring Parameters     ??? Injection site reaction (redness, irritation, inflammation localized to the site of administration)  ??? Signs of a common cold - minor sore throat, runny or stuffy nose, etc.  ??? Feeling tired or weak  ??? Headache    The following side effects should be reported to the provider:  ??? Signs of a hypersensitivity reaction - rash; hives; itching; red, swollen, blistered, or peeling skin; wheezing; tightness in the chest or throat; difficulty breathing, swallowing, or talking; swelling of the mouth, face, lips, tongue, or throat; etc.  ??? Reduced immune function - report signs of infection such as fever; chills; body aches; very bad sore throat; ear or sinus pain; cough; more sputum or change in color of sputum; pain with passing urine; wound that will not heal, etc.  Also at a slightly higher risk of some malignancies (mainly skin and blood cancers) due to this reduced immune function.  o In the case of signs of infection - the patient should hold the next dose of Stelara?? and call your primary care provider to ensure adequate medical care.  Treatment may be resumed when infection is treated and patient is asymptomatic.  ??? Changes in skin - a new growth or lump that forms; changes in shape, size, or color of a previous mole or marking  ??? Shortness of breath or chest pain  ??? Vaginal itching or discharge      Contraindications, Warnings, & Precautions     ??? Have your bloodwork checked as you have been told by your prescriber  ??? Talk with your doctor if you are pregnant, planning to become pregnant, or breastfeeding  ??? Discuss the possible need for holding your dose(s) of Stelara?? when a planned procedure is scheduled with the prescriber as it may delay healing/recovery timeline       Drug/Food Interactions     ??? Medication list reviewed in Epic. The patient was instructed to inform the care team before taking any new medications or supplements. No drug interactions identified.   ??? If you have a latex allergy use caution when handling, the needle cap of the Stelara?? prefilled syringe contains a derivative of natural rubber latex  ??? Talk with you prescriber or pharmacist before receiving any live vaccinations while taking this medication and after you stop taking it    Storage, Handling Precautions, & Disposal     ??? Store this medication in the refrigerator.  Do not freeze  ??? If needed, you may store at room temperature for up to 30 days  ??? Store in original packaging, protected from light  ??? Do not shake  ??? Dispose of used syringes/pens in a sharps disposal container            Current Medications (including OTC/herbals), Comorbidities and Allergies     Current Outpatient Medications   Medication Sig Dispense Refill   ??? albuterol HFA 90 mcg/actuation inhaler Inhale 2 puffs every six (6) hours as needed for shortness of breath. 18 g 2   ??? amitriptyline (ELAVIL) 10 MG tablet TAKE ONE TABLET BY MOUTH ONCE DAILY AT BEDTIME.     ???  aspirin (ECOTRIN) 81 MG tablet Take 1 tablet (81 mg total) by mouth daily. 90 tablet 3   ??? benzonatate (TESSALON PERLES) 100 MG capsule Take 1-2 capsules (100-200 mg total) by mouth Three (3) times a day as needed for cough. 40 capsule 1   ??? blood-glucose meter Misc Use as directed by provider. 1 each 0   ??? brexpiprazole (REXULTI) 2 mg Tab Take 2 mg by mouth daily.     ??? canagliflozin (INVOKANA) 300 mg Tab tablet Take 1 tablet (300 mg total) by mouth every morning before breakfast. 90 tablet 3   ??? citalopram (CELEXA) 40 MG tablet Take 40 mg by mouth daily.     ??? clobetasoL (TEMOVATE) 0.05 % ointment Apply topically Two (2) times a day. To areas of psoriasis 120 g 3   ??? clonazePAM (KLONOPIN) 0.5 MG tablet Take 0.5 mg by mouth daily.     ??? dulaglutide 1.5 mg/0.5 mL PnIj Inject the contents of 2 syringes (3 mg total) under the skin every seven (7) days. 4 mL 6   ??? ferrous sulfate (SLOW FE) 142 mg (45 mg iron) TbER Take 1 tablet (142 mg) by mouth two (2) times a day. 60 tablet 3   ??? HYDROcodone-chlorpheniramine polistirex (TUSSIONEX PENNKINETIC) 10-8 mg/5 mL ER suspension Take 5 mL by mouth every twelve (12) hours as needed for cough. 115 mL 0   ??? insulin glargine (LANTUS U-100 INSULIN) 100 unit/mL injection Inject 0.66 mL (66 Units total) under the skin nightly. 60 mL 1   ??? lancets 30 gauge Misc Use as directed to test once daily. 100 each 3   ??? lisinopriL (PRINIVIL,ZESTRIL) 40 MG tablet Take 1 tablet (40 mg total) by mouth in the morning. 90 tablet 1   ??? metFORMIN (GLUCOPHAGE-XR) 500 MG 24 hr tablet Take 2 tablets (1,000 mg total) by mouth in the morning and 2 tablets (1,000 mg total) in the evening. Take with meals. 360 tablet 1   ??? pantoprazole (PROTONIX) 40 MG tablet Take 1 tablet (40 mg total) by mouth Two (2) times a day. 180 tablet 1   ??? secukinumab (COSENTYX PEN, 2 PENS,) 150 mg/mL PnIj injection Inject the contents of 2 pens (300 mg) under the skin every 4 weeks. 2 mL 12   ??? simvastatin (ZOCOR) 20 MG tablet Take 1 tablet (20 mg total) by mouth every evening. 90 tablet 1   ??? tolterodine (DETROL LA) 4 MG 24 hr capsule Take 1 capsule (4 mg total) by mouth daily. 90 capsule 2   ??? triamcinolone (KENALOG) 0.1 % cream Apply topically Two (2) times a day. 480 g 1   ??? ustekinumab (STELARA) 90 mg/mL Syrg syringe Inject contents of one syringe (90mg ) under skin every 12 weeks 1 mL 3   ??? ustekinumab (STELARA) 90 mg/mL Syrg syringe Inject contents of one syringe (90mg ) under the skin at week 0 and week 4. THEN 1 syringe (90mg ) every 12 weeks thereafter. 2 mL 0     No current facility-administered medications for this visit.       No Known Allergies    Patient Active Problem List   Diagnosis   ??? PFD (pelvic floor dysfunction)   ??? Type 2 diabetes mellitus, with long-term current use of insulin (CMS-HCC)   ??? Hemorrhage of rectum and anus   ??? Hyperlipidemia   ??? Hypertension   ??? Morbid obesity (CMS-HCC)   ??? Dry eye syndrome, bilateral   ??? Angular blepharoconjunctivitis of both eyes   ???  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 H/O diabetic gastroparesis   ??? Retinopathy of left eye, background, proliferative   ??? Coronary artery disease involving native heart without angina pectoris   ??? Hepatic steatosis   ??? Diabetes mellitus (CMS-HCC)   ??? Diabetic retinopathy (CMS-HCC)   ??? Ptosis of eyelid, left       Reviewed and up to date in Epic.    Appropriateness of Therapy     Acute infections noted within Epic:  No active infections  Patient reported infection: None    Is medication and dose appropriate based on diagnosis and infection status? Yes    Prescription has been clinically reviewed: Yes      Baseline Quality of Life Assessment      How many days over the past month did your psoriasis  keep you from your normal activities? For example, brushing your teeth or getting up in the morning. 0    Financial Information     Medication Assistance provided: Prior Authorization    Anticipated copay of $4 reviewed with patient. Verified delivery address.    Delivery Information     Scheduled delivery date: Monday, 1/30    Expected start date: Monday, 1/30 (will verify with provider that OK to use)    Medication will be delivered via Same Day Courier to the prescription address in [facility].  This shipment will not require a signature.      Explained the services we provide at [facility] and that each month we would call to set up refills.  Stressed importance of returning phone calls so that we could ensure they receive their medications in time each month.  Informed patient that we should be setting up refills 7-10 days prior to when they will run out of medication.  A pharmacist will reach out to perform a clinical assessment periodically.  Informed patient that a welcome packet, containing information about our pharmacy and other support services, a Notice of Privacy Practices, and a drug information handout will be sent.      The patient or caregiver noted above participated in the development of this care plan and knows that they can request review of or adjustments to the care plan at any time.      Patient or caregiver verbalized understanding of the above information as well as how to contact the pharmacy at [phone] option 4 with any questions/concerns.  The pharmacy is open Monday through Friday 8:30am-4:30pm.  A pharmacist is available 24/7 via pager to answer any clinical questions they may have.    Patient Specific Needs     - Does the patient have any physical, cognitive, or cultural barriers? No    - Does the patient have adequate living arrangements? (i.e. the ability to store and take their medication appropriately) Yes    - Did you identify any home environmental safety or security hazards? No    - Patient prefers to have medications discussed with  Patient     - Is the patient or caregiver able to read and understand education materials at a high school level or above? Yes    - Patient's primary language is  English     - Is the patient high risk? No      Maleni Seyer A Desiree Lucy [facility] Specialty Pharmacist alcohol?: Monthly or less   ??? How many drinks containing alcohol do you have on a typical day when  you are drinking?: 1 - 2   ??? How often do you have 5 or more drinks on one occasion?: Never   Housing/Utilities: High Risk   ??? Within the past 12 months, have you ever stayed: outside, in a car, in a tent, in an overnight shelter, or temporarily in someone else's home (i.e. couch-surfing)?: Yes   ??? Are you worried about losing your housing?: Yes   ??? Within the past 12 months, have you been unable to get utilities (heat, electricity) when it was really needed?: Yes   Substance Use: Low Risk    ??? Taken prescription drugs for non-medical reasons: Never   ??? Taken illegal drugs: Never   ??? Patient indicated they have taken drugs in the past year for non-medical reasons: Yes, [positive answer(s)]: Not on file   Financial Resource Strain: High Risk   ??? Difficulty of Paying Living Expenses: Hard   Physical Activity: Insufficiently Active   ??? Days of Exercise per Week: 1 day   ??? Minutes of Exercise per Session: 40 min   Health Literacy: Low Risk    ??? : Never   Stress: Not on file   Intimate Partner Violence: Not At Risk   ??? Fear of Current or Ex-Partner: No   ??? Emotionally Abused: No   ??? Physically Abused: No   ??? Sexually Abused: No   Depression: Not at risk   ??? PHQ-2 Score: 0   Social Connections: Not on file       Would you be willing to receive help with any of the needs that you have identified today? {Yes/No/Not applicable:93005}       Armoni Kludt A Desiree Lucy [facility] Specialty Pharmacist

## 2022-01-03 DIAGNOSIS — Z794 Long term (current) use of insulin: Principal | ICD-10-CM

## 2022-01-03 DIAGNOSIS — E1169 Type 2 diabetes mellitus with other specified complication: Principal | ICD-10-CM

## 2022-01-03 MED ORDER — INSULIN GLARGINE (U-100) 100 UNIT/ML (3 ML) SUBCUTANEOUS PEN
Freq: Every evening | SUBCUTANEOUS | 1 refills | 22 days | Status: CP
Start: 2022-01-03 — End: 2023-01-03
  Filled 2022-01-14: qty 20, 31d supply, fill #1

## 2022-01-09 NOTE — Unmapped (Unsigned)
[facility] at [facility] Note:  [place], [place] [address]. Phone [phone]    01/13/2022    Patient Name:   Ashley Briggs    MRN: [id number]    Demographics:    Age-  56 y.o.     Date of Birth-  1966-08-06    Chief complaint (CC):    Chief Complaint   Patient presents with   ??? Abdominal Pain     RLQ pain from a fall 2 weeks ago.        Assessment/Plan:    Yina is a very pleasant 56 y.o. female with CAD, HTN, Hepatic Steatosis, improving DM2 (HgbA1c 6.4 today, last 11.3 09/12/21), Hyperlipidemia, GERD (stable), and RLQ abdominal pain. Lost 16 pounds since last visit with increased activity.     RLQ abdominal tenderness x2 weeks following mechanical fall onto bedpost. No ecchymosis, visible deformity, swelling noted however she did have some mild tenderness to palpation and healing abrasion on exam. She reports symptoms have been improving, advised watchful waiting. Continue Celebrex 200 mg BID prn for pain. Asked her to let me know if symptoms fail to improve or worsen.     Advised to continue mediations as listed and follow diabetic style/carb consistent diet. Congratulated on recent weight loss and improved glycemic control. Advised to follow up with Cardiology for Echocardiogram and consult.      Diagnosis ICD-10-CM Associated Orders   1. Type 2 diabetes mellitus without complication, unspecified whether long term insulin use (CMS-HCC)  E11.9 Albumin/creatinine urine ratio     POCT glycosylated hemoglobin (Hb A1C)      2. Injury of abdomen, initial encounter  S39.91XA           30 minutes of clinical time > 1/2 the office visit was face to face time. We discussed medical, dietary, lifestyle, and health maintenance modifications to optimize health. Due to COVID-19, masks worn and standard precautions followed during visit. Medication adherence and barriers to the treatment plan have been addressed.  Patient voiced understanding, needs F/U visit in 4 months, sooner prn.    Health Maintenance:   Health Maintenance Due   Topic Date Due   ??? Zoster Vaccines (1 of 2) Never done   ??? COVID-19 Vaccine (3 - Moderna risk series) 07/26/2020   ??? Urine Albumin/Creatinine Ratio  11/27/2021     Subjective:    History of present illness (HPI):       Ashley Briggs is a 56 y.o. female who [facility] to [facility] for F/U DM2. She reports she is feeling excellent today. She has been monitoring her home fasting blood sugars and reports average readings have been between 60-100 in the past month. When her sugars are too low she note that she will feel sweaty, weak and disoriented. She has been meeting with [facility] for persistent bilateral knee pain and reports they are considering total knee replacements. Patient's medical history was reviewed, see Past Medical History. Medications used in the past were reviewed, see medications. Patient reports eating and eliminating well. Pt is sleeping well. Patient has not required emergency room treatment for these symptoms, and has not required hospitalization.     Patient reports she has had persistent RLQ abdominal pain following a fall 1/25 or 12/26/21 (she cannot recall exact date). She says that she believes she turned in her sleep and fell off the edge of her bed, causing her to wake up. She expresses she struck her right lower abdomen on a metal  bedpost while falling and she bled for quite a while. Initially this area had a small knot which has since resolved however she still endorses some tenderness to palpation of this area. No changes to bowels or bladder.     Denies HA, fever, chest pain, shortness of breath, N/V/D, bowel or bladder issues, vision or hearing changes, or swelling.     Wt Readings from Last 3 Encounters:   01/13/22 (!) 107.5 kg (237 lb)   09/12/21 (!) 114.8 kg (253 lb)   05/17/21 (!) 120.2 kg (265 lb)       Relevant ROS: Reviewed 12 systems, positive findings listed, all others negative.    Pertinent Past Med Hx:    Past Medical History:   Diagnosis Date   ??? Anxiety    ??? Blepharitis    ??? Cancer (CMS-HCC)     endometrial 2007   ??? Diabetes mellitus (CMS-HCC)    ??? Diabetic retinopathy (CMS-HCC)     Mild NPDR w/ME, left eye   ??? Endometrial cancer (CMS-HCC) 2007   ??? Gastroparesis    ??? Heart disease    ??? High cholesterol    ??? Hypertension    ??? Liver disease    ??? Non-alcoholic fatty liver disease    ??? 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 Psoriasis    ??? Ptosis of eyelid, left     Left upper eyelid   ??? Reflux        Medications:       Current Outpatient Medications:   ???  amitriptyline (ELAVIL) 10 MG tablet, TAKE ONE TABLET BY MOUTH ONCE DAILY AT BEDTIME., Disp: , Rfl:   ???  aspirin (ECOTRIN) 81 MG tablet, Take 1 tablet (81 mg total) by mouth daily., Disp: 90 tablet, Rfl: 3  ???  blood-glucose meter Misc, Use as directed by provider., Disp: 1 each, Rfl: 0  ???  brexpiprazole (REXULTI) 2 mg Tab, Take 2 mg by mouth daily., Disp: , Rfl:   ???  canagliflozin (INVOKANA) 300 mg Tab tablet, Take 1 tablet (300 mg total) by mouth every morning before breakfast., Disp: 90 tablet, Rfl: 3  ???  citalopram (CELEXA) 40 MG tablet, Take 40 mg by mouth daily., Disp: , Rfl:   ???  clobetasoL (TEMOVATE) 0.05 % ointment, Apply topically Two (2) times a day. To areas of psoriasis, Disp: 120 g, Rfl: 3  ???  clonazePAM (KLONOPIN) 0.5 MG tablet, Take 0.5 mg by mouth daily., Disp: , Rfl:   ???  dulaglutide 1.5 mg/0.5 mL PnIj, Inject the contents of 2 syringes (3 mg total) under the skin every seven (7) days., Disp: 4 mL, Rfl: 6  ???  ferrous sulfate (SLOW FE) 142 mg (45 mg iron) TbER, Take 1 tablet (142 mg) by mouth two (2) times a day., Disp: 60 tablet, Rfl: 3  ???  insulin glargine (LANTUS U-100 INSULIN) 100 unit/mL injection, Inject 0.66 mL (66 Units total) under the skin nightly., Disp: 60 mL, Rfl: 1  ???  lancets 30 gauge Misc, Use as directed to test once daily., Disp: 100 each, Rfl: 3  ???  lisinopriL (PRINIVIL,ZESTRIL) 40 MG tablet, Take 1 tablet (40 mg total) by mouth in the morning., Disp: 90 tablet, Rfl: 1  ???  metFORMIN (GLUCOPHAGE-XR) 500 MG 24 hr tablet, Take 2 tablets (1,000 mg total) by mouth in the morning and 2 tablets (1,000 mg total) in the evening. Take with meals., Disp: 360 tablet, Rfl: 1  ???  pantoprazole (PROTONIX) 40 MG tablet, Take 1 tablet (40 mg total) by mouth Two (2) times a day., Disp: 180 tablet, Rfl: 1  ???  simvastatin (ZOCOR) 20 MG tablet, Take 1 tablet (20 mg total) by mouth every evening., Disp: 90 tablet, Rfl: 1  ???  tolterodine (DETROL LA) 4 MG 24 hr capsule, Take 1 capsule (4 mg total) by mouth daily., Disp: 90 capsule, Rfl: 2  ???  triamcinolone (KENALOG) 0.1 % cream, Apply topically Two (2) times a day., Disp: 480 g, Rfl: 1  ???  ustekinumab (STELARA) 90 mg/mL Syrg syringe, Inject contents of one syringe (90mg ) under skin every 12 weeks, Disp: 1 mL, Rfl: 3  ???  ustekinumab (STELARA) 90 mg/mL Syrg syringe, Inject contents of one syringe (90mg ) under the skin at week 0 and week 4. THEN 1 syringe (90mg ) every 12 weeks thereafter., Disp: 2 mL, Rfl: 0  ???  albuterol HFA 90 mcg/actuation inhaler, Inhale 2 puffs every six (6) hours as needed for shortness of breath. (Patient not taking: Reported on 01/13/2022), Disp: 18 g, Rfl: 2  ???  celecoxib (CELEBREX) 200 MG capsule, TAKE 1 CAPSULE BY MOUTH TWICE DAILY AS NEEDED FOR PAIN, Disp: , Rfl:      Allergies:   No Known Allergies    Pertinent Social Hx and Habits: EMR reviewed    Pertinent Family Hx: EMR reviewed    Objective:      BP Readings from Last 3 Encounters:   01/13/22 102/58   09/12/21 116/60   05/17/21 108/52        Vitals:    01/13/22 1314   BP: 102/58   BP Site: R Arm   BP Position: Sitting   Pulse: 82   SpO2: 98%   Weight: (!) 107.5 kg (237 lb)   Height: 162.6 cm (5' 4)        Physical Exam:    General Appearance: WDWN in NAD      Skin: W, D, I.    HEENT: PERRLA, EOMI, TM's clear  Respiratory: Clear throughout  Cardio: RRR  Abdomen: Soft. Healing abrasion on RLQ abdomen. Mildly tender to exam. No exudate or significant ecchymosis.  Neurologic: A & O X 4, Grossly intact, stable gait  PSYCH: Behavior calm and cooperative    Diagnostics:     Lab Results   Component Value Date    WBC 2.9 (L) 09/12/2021    HGB 9.9 (L) 09/12/2021    HCT 31.7 (L) 09/12/2021    PLT 153 09/12/2021       Lab Results   Component Value Date    NA 141 09/12/2021    K 4.3 09/12/2021    CL 107 09/12/2021    CO2 21.0 09/12/2021    BUN 13 09/12/2021    CREATININE 0.63 09/12/2021    GLU 181 (H) 09/12/2021    CALCIUM 9.4 09/12/2021       Lab Results   Component Value Date    BILITOT 2.2 (H) 09/12/2021    PROT 7.2 09/12/2021    ALBUMIN 2.9 (L) 09/12/2021    ALT 20 09/12/2021    AST 49 (H) 09/12/2021    ALKPHOS 131 (H) 09/12/2021       Lab Results   Component Value Date    PT 12.2 01/31/2011    INR 1.1 01/31/2011    APTT 30.7 01/31/2011        No results found for: LIPID, TSH, A1CPCT     I attest that I, Jomarie Longs  Luz Lex, personally documented this note while acting as scribe for Olena Leatherwood, NP.      Benita Stabile, Scribe.  01/13/2022     The documentation recorded by the scribe accurately reflects the service I personally performed and the decisions made by me.     Olena Leatherwood, NP    Dr. Betha Loa, DNP, FNP-BC  [facility] Primary Care at [facility] Certified Doctor of Nursing Practice   [phone]

## 2022-01-13 ENCOUNTER — Ambulatory Visit: Admit: 2022-01-13 | Discharge: 2022-01-14 | Payer: PRIVATE HEALTH INSURANCE | Attending: Family | Primary: Family

## 2022-01-13 DIAGNOSIS — S3991XA Unspecified injury of abdomen, initial encounter: Principal | ICD-10-CM

## 2022-01-13 DIAGNOSIS — E119 Type 2 diabetes mellitus without complications: Principal | ICD-10-CM

## 2022-01-13 NOTE — Unmapped (Signed)
Hello Felicie,    Excellent progress on your blood sugar and weight reduction.    Please continue current medications and eating plan.    Please follow-up as scheduled or with a phone visit with any further questions or concerns.    Take Care,  Dr. Nira Retort

## 2022-01-15 NOTE — Unmapped (Incomplete)
Ashley Briggs reports seeing some improvements in her itching and plaques. She continues to use clobetasol PRN.    Second loading dose due 2/27, and first maintenance 12 weeks later on 04/21/22.     [facility] Clinical Assessment & Refill Coordination Note    Ashley Briggs, DOB: 09/26/1966  Phone: [phone] (home)     All above HIPAA information was verified with patient.     Was a Nurse, learning disability used for this call? No    Specialty Medication(s):   Inflammatory Disorders: Stelara     Current Outpatient Medications   Medication Sig Dispense Refill   ??? albuterol HFA 90 mcg/actuation inhaler Inhale 2 puffs every six (6) hours as needed for shortness of breath. (Patient not taking: Reported on 01/13/2022) 18 g 2   ??? amitriptyline (ELAVIL) 10 MG tablet TAKE ONE TABLET BY MOUTH ONCE DAILY AT BEDTIME.     ??? aspirin (ECOTRIN) 81 MG tablet Take 1 tablet (81 mg total) by mouth daily. 90 tablet 3   ??? blood-glucose meter Misc Use as directed by provider. 1 each 0   ??? brexpiprazole (REXULTI) 2 mg Tab Take 2 mg by mouth daily.     ??? canagliflozin (INVOKANA) 300 mg Tab tablet Take 1 tablet (300 mg total) by mouth every morning before breakfast. 90 tablet 3   ??? celecoxib (CELEBREX) 200 MG capsule TAKE 1 CAPSULE BY MOUTH TWICE DAILY AS NEEDED FOR PAIN     ??? citalopram (CELEXA) 40 MG tablet Take 40 mg by mouth daily.     ??? clobetasoL (TEMOVATE) 0.05 % ointment Apply topically Two (2) times a day. To areas of psoriasis 120 g 3   ??? clonazePAM (KLONOPIN) 0.5 MG tablet Take 0.5 mg by mouth daily.     ??? dulaglutide 1.5 mg/0.5 mL PnIj Inject the contents of 2 syringes (3 mg total) under the skin every seven (7) days. 4 mL 6   ??? ferrous sulfate (SLOW FE) 142 mg (45 mg iron) TbER Take 1 tablet (142 mg) by mouth two (2) times a day. 60 tablet 3   ??? insulin glargine (LANTUS U-100 INSULIN) 100 unit/mL injection Inject 0.66 mL (66 Units total) under the skin nightly. 60 mL 1   ??? lancets 30 gauge Misc Use as directed to test once daily. 100 each 3   ??? lisinopriL (PRINIVIL,ZESTRIL) 40 MG tablet Take 1 tablet (40 mg total) by mouth in the morning. 90 tablet 1   ??? metFORMIN (GLUCOPHAGE-XR) 500 MG 24 hr tablet Take 2 tablets (1,000 mg total) by mouth in the morning and 2 tablets (1,000 mg total) in the evening. Take with meals. 360 tablet 1   ??? pantoprazole (PROTONIX) 40 MG tablet Take 1 tablet (40 mg total) by mouth Two (2) times a day. 180 tablet 1   ??? simvastatin (ZOCOR) 20 MG tablet Take 1 tablet (20 mg total) by mouth every evening. 90 tablet 1   ??? tolterodine (DETROL LA) 4 MG 24 hr capsule Take 1 capsule (4 mg total) by mouth daily. 90 capsule 2   ??? triamcinolone (KENALOG) 0.1 % cream Apply topically Two (2) times a day. 480 g 1   ??? ustekinumab (STELARA) 90 mg/mL Syrg syringe Inject contents of one syringe (90mg ) under skin every 12 weeks 1 mL 3   ??? ustekinumab (STELARA) 90 mg/mL Syrg syringe Inject contents of one syringe (90mg ) under the skin at week 0 and week 4. THEN 1 syringe (90mg ) every 12 weeks thereafter.  2 mL 0     No current facility-administered medications for this visit.        Changes to medications: Ashley Briggs reports no changes at this time.    No Known Allergies    Changes to allergies: No    SPECIALTY MEDICATION ADHERENCE     Stelara - 0 left    Medication Adherence    Patient reported X missed doses in the last month: 0  Specialty Medication: Stelara          Specialty medication(s) dose(s) confirmed: Regimen is correct and unchanged.     Are there any concerns with adherence? No    Adherence counseling provided? Not needed    CLINICAL MANAGEMENT AND INTERVENTION      Clinical Benefit Assessment:    Do you feel the medicine is effective or helping your condition? Yes    Clinical Benefit counseling provided? Not needed    Adverse Effects Assessment:    Are you experiencing any side effects? No    Are you experiencing difficulty administering your medicine? No    Quality of Life Assessment:    Quality of Life Rheumatology  Oncology  Dermatology  1. What impact has your specialty medication had on the symptoms of your skin condition (i.e. itchiness, soreness, stinging)?: Some  2. What impact has your specialty medication had on your comfort level with your skin?: Some  Cystic Fibrosis          Have you discussed this with your provider? Not needed    Acute Infection Status:    Acute infections noted within Epic:  No active infections  Patient reported infection: None    Therapy Appropriateness:    Is therapy appropriate and patient progressing towards therapeutic goals? Yes, therapy is appropriate and should be continued    DISEASE/MEDICATION-SPECIFIC INFORMATION      For patients on injectable medications: Patient currently has 0 doses left.  Next injection is scheduled for 2/27.    PATIENT SPECIFIC NEEDS     - Does the patient have any physical, cognitive, or cultural barriers? No    - Is the patient high risk? No    - Does the patient require a Care Management Plan? No      SHIPPING     Specialty Medication(s) to be Shipped:   Inflammatory Disorders: Stelara    Other medication(s) to be shipped: Trulicity - shipping on same day, then Invokana and Slow Fe to delivery 2/23     Changes to insurance: No    Delivery Scheduled: Yes, Expected medication delivery date: 2/27.     Medication will be delivered via Same Day Courier to the confirmed prescription address in [facility].    The patient will receive a drug information handout for each medication shipped and additional FDA Medication Guides as required.  Verified that patient has previously received a Conservation officer, historic buildings and a Surveyor, mining.    The patient or caregiver noted above participated in the development of this care plan and knows that they can request review of or adjustments to the care plan at any time.      All of the patient's questions and concerns have been addressed.    Tawanna Solo Shared [facility] Specialty Pharmacist to insurance: {Blank:19197::Yes: ***,No}    Delivery Scheduled: {Blank:19197::Yes, Expected medication delivery date: ***.,Yes, Expected medication delivery date: ***.  However, Rx request for refills was sent to the provider as there are none remaining.,Patient  declined refill at this time due to ***.,No, cannot schedule delivery at this time as there are outstanding items that need addressed.  This note has been handed off to the provider for follow up.,Due to patient insurance changes, unable to fill at [facility], please route Rx to *** specialty pharmacy}     Medication will be delivered via {Blank:19197::UPS,Next Day Courier,Same Day Courier,Clinic Courier - *** clinic,***} to the confirmed {Blank:19197::prescription,temporary} address in [facility].    The patient will receive a drug information handout for each medication shipped and additional FDA Medication Guides as required.  Verified that patient has previously received a Conservation officer, historic buildings and a Surveyor, mining.    The patient or caregiver noted above participated in the development of this care plan and knows that they can request review of or adjustments to the care plan at any time.      All of the patient's questions and concerns have been addressed.    Lanney Gins   [facility] Specialty Pharmacist

## 2022-01-22 MED FILL — INVOKANA 300 MG TABLET: ORAL | 30 days supply | Qty: 30 | Fill #10

## 2022-01-22 MED FILL — SLOW FE 142 MG (45 MG IRON) TABLET,EXTENDED RELEASE: ORAL | 30 days supply | Qty: 60 | Fill #3

## 2022-01-23 ENCOUNTER — Ambulatory Visit: Admit: 2022-01-23 | Discharge: 2022-01-24 | Payer: PRIVATE HEALTH INSURANCE

## 2022-01-23 NOTE — Unmapped (Signed)
Thank you for choosing [facility]!  We appreciate the opportunity to participate in your care.      You are a candidate for knee replacement surgery after:  -dental clearance  -PCP clearance    Once we receive your primary care and dental clearance forms, you will tentatively be scheduled for surgery and a preoperative visit with Dr. Daiva Huge.    To learn more about hip/knee replacement surgery, please go to the following website and review the following document titled:  Total Joint Replacement Surgery Patient Education Booklet  [email or web address]     If any questions or concerns arise after your visit, please do not hesitant to contact me by [facility] or by calling the total joint team at [phone] or [phone].       Voicemail messages: Messages are checked between 8:00 am- 4:00 pm Monday- Friday.     MyChart messages: These messages are checked by the nurses during normal business hours 8:30 am-4:30 pm Monday-Friday every 24-48 hours and are for non-urgent, non-emergent concerns. You may be asked to return for a follow up visit if it is deemed your questions are best handled in the clinic setting.     Please let me know if I can be of assistance with this or other orthopaedic issues in the future.

## 2022-01-23 NOTE — Unmapped (Signed)
CC: Pain of the Right Knee and Pain of the Left Knee     HPI:  56 y.o. female presents for evaluation of bilateral knees -- referred from Anise Salvo. Reports longstanding bilateral knee pain, left greater than right. Her pain is constant and wakes her at night. It severely limits her mobility. She is currently looking for a new home so she and her roommate are living in a hotel. This requires going up stairs to her room and has become very difficult.   She has tried steroid injections, which initially worked but no longer provide relief.  She is also tried Celebrex in the past.    ROS: Negative for fever, chills, chest pain, cough, SOB.    PMH/PSH:  Pertinent history includes:  -Diabetes mellitus (A1c 6.4)  -History of endometrial cancer (treated with hysterectomy)  -Gastroparesis  -Nonalcoholic fatty liver disease  -Hypertension  -Hyperlipidemia  -Psoriasis    Denies cardiac history, seizures, history of VTE, autoimmune disorders, history of MRSA.     Social Hx:  Tobacco Use: Medium Risk   ??? Smoking Tobacco Use: Never   ??? Smokeless Tobacco Use: Never   ??? Passive Exposure: Current      Dental: Has been several years  Currently living in a hotel, looking for a new place to live.  Currently lives with a roommate and one of her friends.    Physical Exam:  General: Alert, no acute distress.   Estimated body mass index is 39.84 kg/m?? as calculated from the following:    Height as of this encounter: 163.5 cm (5' 4.37).    Weight as of this encounter: 106.5 kg (234 lb 12.8 oz).   Respiratory: Non labored respirations.  Integumentary: No obvious rashes, lesions, or open wounds.   Musculoskeletal: Ambulating independently. Gait WNL.  Bilateral LE: No open wounds or erythema.  Bilateral knees tender to palpation along joint lines. Active ROM 3-115 at right knee and 12-105 at left knee. No extensor lag. 5/5 strength with extension and flexion. Stable to varus and valgus stress at 0, 30, 90 degrees. Negative anterior and posterior drawer.  NV intact distally.     Imaging:  Radiographs of right knee obtained and reviewed showing moderate degenerative changes. Patella tracking midline. No evidence of fracture.   Radiographs of left knee obtained and reviewed showing severe degenerative changes. Patella tracking midline. No evidence of fracture.     Assessment:  1. Bilateral primary osteoarthritis of knee       Plan:  56 y.o. female with severe left knee osteoarthritis and moderate right knee osteoarthritis. We discussed the natural history of osteoarthritis and reviewed the imaging studies together.  We discussed treatment options to include continued conservative management with medications, injections, and activity modifications versus surgical intervention, specifically total knee arthroplasty.  At this time the patient is most interested in pursuing total knee arthroplasty.  We had an in-depth discussion regarding the risks, benefits, operative procedure, and postoperative expectations.  Patient wishes to proceed.       Before patient can be scheduled for surgery, they will need to reach the following requirements:  -clearance from PCP (clearance form provided)  -clearance from dentist (clearance form provided)  -stable living environment    Once patient meets the above requirements, I will discuss this case with Dr. Daiva Huge, and if approved patient will be tentatively scheduled for surgery as well as scheduled for a preoperative visit with Dr. Daiva Huge.     Patient demonstrated understanding and was in  agreement with the plan outlined above.    This note was transcribed with the use of dictation software, please excuse any typos or grammatical errors.    DME ORDER:  Dx:  ,

## 2022-01-27 MED FILL — TRULICITY 1.5 MG/0.5 ML SUBCUTANEOUS PEN INJECTOR: SUBCUTANEOUS | 28 days supply | Qty: 4 | Fill #6

## 2022-01-27 MED FILL — STELARA 90 MG/ML SUBCUTANEOUS SYRINGE: 34 days supply | Qty: 1 | Fill #1

## 2022-02-06 NOTE — Unmapped (Signed)
Unable to contact patient, no answer and voice mailbox is full.  Hopefully she will read her mychart msg.

## 2022-02-07 NOTE — Unmapped (Signed)
Patient has an appt on 3/14 with Dennard Nip for this problem

## 2022-02-07 NOTE — Unmapped (Signed)
[facility] at [facility] Note:  [place], [place] [address]. Phone [phone]    02/11/2022    Patient Name:   Ashley Briggs    MRN: [id number]    Demographics:    Age-  56 y.o.     Date of Birth-  31-Jul-1966    Chief complaint (CC):    Chief Complaint   Patient presents with   ??? Groin Pain     One week of pelvic pain, where the ovaries are, total hysterectomy in 2007.          Assessment/Plan:    Ashley Briggs is a pleasant 56 y.o. female with pelvic pain. Suspect interstitial cystitis given Hx of cystitis and Group B Strep (also taking Invokana which is relatively new for her). POCT UA with some sugar noted but was otherwise unremarkable. Advised she begin cephalexin 500 mg bid x10 days, potential risks vs benefits reviewed. Discussed that if symptoms fail to improve I would recommend we pursue CT abdomen given Hx of hysterectomy (2007) and pelvic floor duysfunction, repeat CBC as needed (Anemia and neutropenia, on Stelara). ER precautions discussed, follow up if symptoms fail to improve or worsen.       Diagnosis ICD-10-CM Plan/Associated Orders   1. Pelvic pain in female  R10.2 POCT urinalysis dipstick     cephalexin (KEFLEX) 500 MG capsule      2. Type 2 diabetes mellitus without complication, unspecified whether long term insulin use (CMS-HCC)  E11.9 Albumin/creatinine urine ratio             Health Maintenance:   Health Maintenance Due   Topic Date Due   ??? Zoster Vaccines (1 of 2) Advised, may complete at any pharmacy   ??? COVID-19 Vaccine (3 - Moderna risk series) 07/26/2020   ??? Urine Albumin/Creatinine Ratio  Ordered       20 minutes of clinical time > 1/2 the office visit was face to face time. We discussed medical, dietary, lifestyle, and health maintenance modifications to optimize health. Due to COVID-19, masks worn and standard precautions followed during visit. Medication adherence and barriers to the treatment plan have been addressed.  Patient voiced understanding, needs F/U visit as scheduled in June.    Subjective:    History of present illness (HPI):       Ashley Briggs is a 56 y.o. female who presented to [facility] for evaluation of pelvic pain. Patient reported via MyChart 02/03/22 persistent pain in left pelvic region. She noted a Hx of total hysterectomy in 2007 due to endometrial cancer. She was interested in obtaining a referral to Gynecology for further evaluation and management, however I requested she follow up in office to rule out UTI.    Today patient reports her pelvic pain is ongoing. Patient notes her pain started in her left-sided groin and quickly spread to the right groin.. Pain is still more severe in her left pelvis and she expresses she feels a constant pressure in this area. Patient endorses single episode of fever 2 days ago. She has been taking Aleve without improvement in symptoms. Bowel movements have been normal, she denies any urinary symptoms. She denies any vaginal bleeding, discharge, rashes. She has not been sexually active recently.     Patient's medical history was reviewed, see Past Medical History. Medications used in the past were reviewed, see medications. Patient reports eating and eliminating well. Pt is sleeping well. Patient has not required emergency room treatment for these symptoms, and has not  required hospitalization.     Denies HA, chest pain, shortness of breath, N/V/D, bowel or bladder issues, vision or hearing changes, or swelling.     Relevant ROS: Reviewed 12 systems, positive findings listed, all others negative.    Pertinent Past Med Hx:    Past Medical History:   Diagnosis Date   ??? Anxiety    ??? Blepharitis    ??? Cancer (CMS-HCC)     endometrial 2007   ??? Diabetes mellitus (CMS-HCC)    ??? Diabetic retinopathy (CMS-HCC)     Mild NPDR w/ME, left eye   ??? Endometrial cancer (CMS-HCC) 2007   ??? Gastroparesis    ??? Heart disease    ??? High cholesterol    ??? Hypertension    ??? Liver disease    ??? Non-alcoholic fatty liver disease    ??? 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 Psoriasis    ??? Ptosis of eyelid, left     Left upper eyelid   ??? Reflux        Medications:       Current Outpatient Medications:   ???  amitriptyline (ELAVIL) 10 MG tablet, TAKE ONE TABLET BY MOUTH ONCE DAILY AT BEDTIME., Disp: , Rfl:   ???  aspirin (ECOTRIN) 81 MG tablet, Take 1 tablet (81 mg total) by mouth daily., Disp: 90 tablet, Rfl: 3  ???  blood-glucose meter Misc, Use as directed by provider., Disp: 1 each, Rfl: 0  ???  brexpiprazole (REXULTI) 2 mg Tab, Take 1 tablet (2 mg total) by mouth daily., Disp: , Rfl:   ???  canagliflozin (INVOKANA) 300 mg Tab tablet, Take 1 tablet (300 mg total) by mouth every morning before breakfast., Disp: 90 tablet, Rfl: 3  ???  celecoxib (CELEBREX) 200 MG capsule, TAKE 1 CAPSULE BY MOUTH TWICE DAILY AS NEEDED FOR PAIN, Disp: , Rfl:   ???  citalopram (CELEXA) 40 MG tablet, Take 1 tablet (40 mg total) by mouth daily., Disp: , Rfl:   ???  clobetasoL (TEMOVATE) 0.05 % ointment, Apply topically Two (2) times a day. To areas of psoriasis, Disp: 120 g, Rfl: 3  ???  clonazePAM (KLONOPIN) 0.5 MG tablet, Take 1 tablet (0.5 mg total) by mouth daily., Disp: , Rfl:   ???  dulaglutide 1.5 mg/0.5 mL PnIj, Inject the contents of 2 syringes (3 mg total) under the skin every seven (7) days., Disp: 4 mL, Rfl: 6  ???  ferrous sulfate (SLOW FE) 142 mg (45 mg iron) TbER, Take 1 tablet (142 mg) by mouth two (2) times a day., Disp: 60 tablet, Rfl: 3  ???  insulin glargine (LANTUS U-100 INSULIN) 100 unit/mL injection, Inject 0.66 mL (66 Units total) under the skin nightly., Disp: 60 mL, Rfl: 1  ???  lancets 30 gauge Misc, Use as directed to test once daily., Disp: 100 each, Rfl: 3  ???  lisinopriL (PRINIVIL,ZESTRIL) 40 MG tablet, Take 1 tablet (40 mg total) by mouth in the morning., Disp: 90 tablet, Rfl: 1  ???  metFORMIN (GLUCOPHAGE-XR) 500 MG 24 hr tablet, Take 2 tablets (1,000 mg total) by mouth in the morning and 2 tablets (1,000 mg total) in the evening. Take with meals., Disp: 360 tablet, Rfl: 1  ???  pantoprazole (PROTONIX) 40 MG tablet, Take 1 tablet (40 mg total) by mouth Two (2) times a day., Disp: 180 tablet, Rfl: 1  ???  simvastatin (ZOCOR) 20 MG tablet, Take 1 tablet (20 mg total) by mouth  every evening., Disp: 90 tablet, Rfl: 1  ???  tolterodine (DETROL LA) 4 MG 24 hr capsule, Take 1 capsule (4 mg total) by mouth daily., Disp: 90 capsule, Rfl: 2  ???  ustekinumab (STELARA) 90 mg/mL Syrg syringe, Inject contents of one syringe (90mg ) under skin every 12 weeks, Disp: 1 mL, Rfl: 3  ???  ustekinumab (STELARA) 90 mg/mL Syrg syringe, Inject contents of one syringe (90mg ) under the skin at week 0 and week 4. THEN 1 syringe (90mg ) every 12 weeks thereafter., Disp: 2 mL, Rfl: 0  ???  albuterol HFA 90 mcg/actuation inhaler, Inhale 2 puffs every six (6) hours as needed for shortness of breath. (Patient not taking: Reported on 01/13/2022), Disp: 18 g, Rfl: 2  ???  cephalexin (KEFLEX) 500 MG capsule, Take 1 capsule (500 mg total) by mouth Two (2) times a day for 10 days., Disp: 20 capsule, Rfl: 0  ???  triamcinolone (KENALOG) 0.1 % cream, Apply topically Two (2) times a day. (Patient not taking: Reported on 02/11/2022), Disp: 480 g, Rfl: 1     Allergies:   No Known Allergies    Pertinent Social Hx and Habits: EMR reviewed    Pertinent Family Hx: EMR reviewed    Objective:      BP Readings from Last 3 Encounters:   02/11/22 120/50   01/13/22 102/58   09/12/21 116/60        Vitals:    02/11/22 0806   BP: 120/50   BP Site: R Arm   BP Position: Sitting   Pulse: 76   SpO2: 98%   Weight: (!) 107.5 kg (237 lb)   Height: 162.6 cm (5' 4)        Physical Exam:    General Appearance: WDWN in NAD      Skin: W, D, I  HEENT: PERRLA, EOMI  Respiratory: Clear throughout  Cardio: RRR  Abdomen: Soft and non-tnder  GU: Bilateral pubic tenderness, some fullness noted on exam L>R.  Neurologic: A & O X 4, Grossly intact, stable gait  PSYCH: Behavior calm and cooperative    Diagnostics:     Lab Results   Component Value Date    WBC 2.9 (L) 09/12/2021    HGB 9.9 (L) 09/12/2021    HCT 31.7 (L) 09/12/2021    PLT 153 09/12/2021       Lab Results   Component Value Date    NA 141 09/12/2021    K 4.3 09/12/2021    CL 107 09/12/2021    CO2 21.0 09/12/2021    BUN 13 09/12/2021    CREATININE 0.63 09/12/2021    GLU 181 (H) 09/12/2021    CALCIUM 9.4 09/12/2021       Lab Results   Component Value Date    BILITOT 2.2 (H) 09/12/2021    PROT 7.2 09/12/2021    ALBUMIN 2.9 (L) 09/12/2021    ALT 20 09/12/2021    AST 49 (H) 09/12/2021    ALKPHOS 131 (H) 09/12/2021       Lab Results   Component Value Date    PT 12.2 01/31/2011    INR 1.1 01/31/2011    APTT 30.7 01/31/2011        No results found for: LIPID, TSH, A1CPCT     I attest that I, Benita Stabile, personally documented this note while acting as scribe for Olena Leatherwood, NP.      Benita Stabile, Scribe.  02/11/2022     The documentation  recorded by the scribe accurately reflects the service I personally performed and the decisions made by me.     Olena Leatherwood, NP    Dr. Betha Loa, DNP, FNP-BC  [facility] Primary [facility] at [facility] Certified Doctor of Nursing Practice   [phone]

## 2022-02-11 ENCOUNTER — Ambulatory Visit: Admit: 2022-02-11 | Discharge: 2022-02-12 | Payer: PRIVATE HEALTH INSURANCE | Attending: Family | Primary: Family

## 2022-02-11 DIAGNOSIS — R102 Pelvic and perineal pain: Principal | ICD-10-CM

## 2022-02-11 DIAGNOSIS — E119 Type 2 diabetes mellitus without complications: Principal | ICD-10-CM

## 2022-02-11 LAB — PROTEIN / CREATININE RATIO, URINE
Albumin, U: 0.3
Creatinine, Urine: 57.3

## 2022-02-11 LAB — ALBUMIN / CREATININE URINE RATIO
ALBUMIN QUANT URINE: <0.3 mg/dL
CREATININE, URINE: 57.3 mg/dL

## 2022-02-11 MED ORDER — CEPHALEXIN 500 MG CAPSULE
ORAL_CAPSULE | Freq: Two times a day (BID) | ORAL | 0 refills | 10 days | Status: CP
Start: 2022-02-11 — End: 2022-02-21
  Filled 2022-02-13: qty 20, 10d supply, fill #0

## 2022-02-14 NOTE — Unmapped (Signed)
Hello Alayssa,    I have reviewed your lab testing and it looks fine.    Take Care,  Dr. Nira Retort

## 2022-02-16 DIAGNOSIS — Z794 Long term (current) use of insulin: Principal | ICD-10-CM

## 2022-02-16 DIAGNOSIS — E113212 Type 2 diabetes mellitus with mild nonproliferative diabetic retinopathy with macular edema, left eye: Principal | ICD-10-CM

## 2022-02-16 MED ORDER — CANAGLIFLOZIN 300 MG TABLET
ORAL_TABLET | Freq: Every day | ORAL | 3 refills | 90 days
Start: 2022-02-16 — End: ?

## 2022-02-17 MED ORDER — CANAGLIFLOZIN 300 MG TABLET
ORAL_TABLET | Freq: Every day | ORAL | 0 refills | 90 days | Status: CP
Start: 2022-02-17 — End: ?
  Filled 2022-02-18: qty 90, 90d supply, fill #0

## 2022-02-17 NOTE — Unmapped (Signed)
Patient is requesting the following refill  Requested Prescriptions     Pending Prescriptions Disp Refills    canagliflozin (INVOKANA) 300 mg Tab tablet 90 tablet 0     Sig: Take 1 tablet (300 mg total) by mouth every morning before breakfast.       Recent Visits  Date Type Provider Dept   02/11/22 Office Visit Desmond Dike, NP [place] [facility]   01/13/22 Office Visit Desmond Dike, NP [place] [facility]   09/12/21 Office Visit Desmond Dike, NP [place] [facility] At [facility]   05/17/21 Office Visit Desmond Dike, NP [place] [facility] At [facility]   05/02/21 Office Visit Deneise Lever, FNP [place] [facility]   04/10/21 Office Visit Jacqualine Code, MD [place] [facility]   03/13/21 Office Visit Desmond Dike, NP [place] [facility] At [facility]   Showing recent visits within past 365 days with a meds authorizing provider and meeting all other requirements  Future Appointments  Date Type Provider Dept   05/13/22 Appointment Desmond Dike, NP  Primary Care At [facility]   Showing future appointments within next 365 days with a meds authorizing provider and meeting all other requirements       Labs: A1c:   Hemoglobin A1C (%)   Date Value   01/13/2022 6.4 (A)    Creatinine:   Creatinine   Date Value   09/12/2021 0.63 mg/dL   16/09/9603 5.40 MG/DL

## 2022-02-18 MED FILL — INSULIN GLARGINE (U-100) 100 UNIT/ML SUBCUTANEOUS SOLUTION: SUBCUTANEOUS | 30 days supply | Qty: 20 | Fill #2

## 2022-02-18 MED FILL — METFORMIN ER 500 MG TABLET,EXTENDED RELEASE 24 HR: ORAL | 90 days supply | Qty: 360 | Fill #1

## 2022-02-19 NOTE — Unmapped (Signed)
Called and left message to schedule an appointment.

## 2022-02-24 NOTE — Unmapped (Signed)
Called and left message to schedule

## 2022-02-26 NOTE — Unmapped (Signed)
My Chart message sent

## 2022-02-27 NOTE — Unmapped (Signed)
[facility] at [facility] Note:  Ashley Briggs, [place] [address]. Phone [phone]    03/06/2022    Patient Name:   Ashley Briggs    MRN: [id number]    Demographics:    Age-  56 y.o.     Date of Birth-  11-Jan-1966    Chief complaint (CC):    Chief Complaint   Patient presents with   ??? Abdominal Pain     Suprapubic pressure and pain for about 2 weeks.   Patient states that it feels like something is ripping out.    ??? Nasal Congestion     Runny nose, sore throat, coughing .   Covid test negative yesterday.        Assessment/Plan:    Pleasant is a pleasant 56 y.o. female with persistent suprapubic abdominal/pelvic pain. Originally suspected interstitial cystitis given Hx of cystitis and Group B Strep, patient found no benefit with cephalexin 500 mg bid x10 days. Repeat POCT UA today with 3+ glucose, consistent with UA from 02/11/22. Exam remains reassuring today, advised we pursue Endovaginal [place] for further evaluation given Hx of hysterectomy (2007) and pelvic floor dysfunction, imaging ordered for her today. CMP, CBC ordered to rule out metabolic contributor. Referral to [facility] advised and placed for further workup. Advised pelvic exam, patient defers. Will defer to UROGYN. Follow up as scheduled for recheck 03/14/22.      Patient with ongoing sinusitis, sore throat. Suspect seasonal allergies, advised she begin loratadine 10 mg daily. Suggested she consider OTC Mucinex DM or Tussion prn. Symptomatic management reviewed, encouraged patient to consider wearing mask while outdoors. Follow up if symptoms fail to improve or worsen.     Encouraged patient to keep scheduled appointment with Endocrinology (Dr. Amalia Briggs) next week to discuss Invokana refill.      Diagnosis ICD-10-CM Associated Orders   1. Pelvic pain in female  R10.2 POCT urinalysis dipstick     Ambulatory referral to Urogynecology     [place] Endovaginal (Non-OB)     Comprehensive Metabolic Panel     CBC  Celebrex prn      2. Type 2 diabetes mellitus with complication, with long term insulin use (CMS-HCC)  E11.9 Advised to continue trulicity, insulin.    We may need to adjust Invokana     Comprehensive Metabolic Panel     CBC          30 minutes of clinical time > 1/2 the office visit was face to face time. We discussed medical, dietary, lifestyle, and health maintenance modifications to optimize health. Due to COVID-19, masks worn and standard precautions followed during visit. Medication adherence and barriers to the treatment plan have been addressed.  Patient voiced understanding, needs F/U visit as scheduled.    Health Maintenance:   Health Maintenance Due   Topic Date Due   ??? Zoster Vaccines (1 of 2) Never done   ??? COVID-19 Vaccine (3 - Moderna risk series) 07/26/2020     Subjective:    History of present illness (HPI):       Ashley Briggs is a 56 y.o. female who presented to [facility] for F/U pelvic pain. Patient reports ongoing, intermittent pelvic pressure and pain in my lower stomach/pelvis since 02/04/22 like the bottom of my abdomen is ripping. Symptoms did not improve with 10 day course of cephalexin 500 mg. S/P hysterectomy, her last Gynecology appointment was in 2007 per patient. She denies any urinary symptoms, vaginal bleeding, increased urinary frequency, BRBPR.  Patient reports fasting blood sugars of 187 yesterday, 123 day before. Reflux well controlled with pantoprazole, endorses intermittent loose stools which have not changed in frequency.    Denies any vaginal discharge or sexual activity.     Patient reports 2 days of ongoing nasal congestion, postnasal drip, sore scratchy throat, and cough. She had a negative COVID-19 home test yesterday.     Patient's medical history was reviewed, see Past Medical History. Medications used in the past were reviewed, see medications. Patient reports she is scheduled to follow up with Endocrinology next week to discuss Invokana refill. Patient reports eating and eliminating well. Pt is sleeping well. Patient has not required emergency room treatment for these symptoms, and has not required hospitalization.     Denies HA, fever, chest pain, shortness of breath, rashes, N/V/D, vision or hearing changes, or swelling.     Relevant ROS: Reviewed 12 systems, positive findings listed, all others negative.    Pertinent Past Med Hx:    Past Medical History:   Diagnosis Date   ??? Anxiety    ??? Blepharitis    ??? Cancer (CMS-HCC)     endometrial 2007   ??? Diabetes mellitus (CMS-HCC)    ??? Diabetic retinopathy (CMS-HCC)     Mild NPDR w/ME, left eye   ??? Endometrial cancer (CMS-HCC) 2007   ??? Gastroparesis    ??? Heart disease    ??? High cholesterol    ??? Hypertension    ??? Liver disease    ??? Non-alcoholic fatty liver disease    ??? 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 Psoriasis    ??? Ptosis of eyelid, left     Left upper eyelid   ??? Reflux        Medications:       Current Outpatient Medications:   ???  amitriptyline (ELAVIL) 10 MG tablet, TAKE ONE TABLET BY MOUTH ONCE DAILY AT BEDTIME., Disp: , Rfl:   ???  aspirin (ECOTRIN) 81 MG tablet, Take 1 tablet (81 mg total) by mouth daily., Disp: 90 tablet, Rfl: 3  ???  blood-glucose meter Misc, Use as directed by provider., Disp: 1 each, Rfl: 0  ???  brexpiprazole (REXULTI) 2 mg Tab, Take 1 tablet (2 mg total) by mouth daily., Disp: , Rfl:   ???  canagliflozin (INVOKANA) 300 mg Tab tablet, Take 1 tablet (300 mg total) by mouth every morning before breakfast., Disp: 90 tablet, Rfl: 0  ???  celecoxib (CELEBREX) 200 MG capsule, Take 1 capsule (200 mg total) by mouth two (2) times a day as needed for pain., Disp: 180 capsule, Rfl: 1  ???  citalopram (CELEXA) 40 MG tablet, Take 1 tablet (40 mg total) by mouth daily., Disp: , Rfl:   ???  clonazePAM (KLONOPIN) 0.5 MG tablet, Take 1 tablet (0.5 mg total) by mouth daily., Disp: , Rfl:   ???  dulaglutide (TRULICITY) 1.5 mg/0.5 mL PnIj, Inject the contents of 2 syringes (3 mg total) under the skin every seven (7) days., Disp: 4 mL, Rfl: 6  ???  insulin glargine (LANTUS U-100 INSULIN) 100 unit/mL injection, Inject 0.66 mL (66 Units total) under the skin nightly., Disp: 60 mL, Rfl: 1  ???  lancets 30 gauge Misc, Use as directed to test once daily., Disp: 100 each, Rfl: 3  ???  lisinopriL (PRINIVIL,ZESTRIL) 40 MG tablet, Take 1 tablet (40 mg total) by mouth in the morning., Disp: 90 tablet, Rfl: 1  ???  metFORMIN (  GLUCOPHAGE-XR) 500 MG 24 hr tablet, Take 2 tablets (1,000 mg total) by mouth in the morning and 2 tablets (1,000 mg total) in the evening. Take with meals., Disp: 360 tablet, Rfl: 1  ???  pantoprazole (PROTONIX) 40 MG tablet, Take 1 tablet (40 mg total) by mouth Two (2) times a day., Disp: 180 tablet, Rfl: 1  ???  simvastatin (ZOCOR) 20 MG tablet, Take 1 tablet (20 mg total) by mouth every evening., Disp: 90 tablet, Rfl: 1  ???  tolterodine (DETROL LA) 4 MG 24 hr capsule, Take 1 capsule (4 mg total) by mouth daily., Disp: 90 capsule, Rfl: 2  ???  triamcinolone (KENALOG) 0.1 % cream, Apply topically Two (2) times a day., Disp: 480 g, Rfl: 1  ???  ustekinumab (STELARA) 90 mg/mL Syrg syringe, Inject contents of one syringe (90mg ) under skin every 12 weeks, Disp: 1 mL, Rfl: 3  ???  ustekinumab (STELARA) 90 mg/mL Syrg syringe, Inject contents of one syringe (90mg ) under the skin at week 0 and week 4. THEN 1 syringe (90mg ) every 12 weeks thereafter., Disp: 2 mL, Rfl: 0  ???  albuterol HFA 90 mcg/actuation inhaler, Inhale 2 puffs every six (6) hours as needed for shortness of breath. (Patient not taking: Reported on 01/13/2022), Disp: 18 g, Rfl: 2  ???  clobetasoL (TEMOVATE) 0.05 % ointment, Apply topically Two (2) times a day. To areas of psoriasis (Patient not taking: Reported on 03/06/2022), Disp: 120 g, Rfl: 3  ???  ferrous sulfate (SLOW FE) 142 mg (45 mg iron) TbER, Take 1 tablet (142 mg) by mouth two (2) times a day., Disp: 60 tablet, Rfl: 3  ???  loratadine (CLARITIN) 10 mg tablet, Take 1 tablet (10 mg total) by mouth daily. For allergy symptoms., Disp: 30 tablet, Rfl: 3 Allergies:   No Known Allergies    Pertinent Social Hx and Habits: EMR reviewed    Pertinent Family Hx: EMR reviewed    Objective:      BP Readings from Last 3 Encounters:   03/06/22 100/50   02/11/22 120/50   01/13/22 102/58        Vitals:    03/06/22 1307   BP: 100/50   BP Site: R Arm   BP Position: Sitting   Pulse: 92   SpO2: 98%   Weight: (!) 107 kg (236 lb)   Height: 162.6 cm (5' 4)        Physical Exam:    General Appearance: WDWN in NAD      Skin: W, D, I  HEENT: PERRLA, EOMI, TM's clear. Posterior oropharynx mildly hyperpigmented. Very mild nasal congestion on exam. No cervical lymphadenopathy.   Respiratory: Clear throughout  Cardio: RRR  Abdomen: Soft. Right-sided suprapubic tenderness. No guarding.   Genitourinary: Patient deferred  Neurologic: A & O X 4, Grossly intact, stable gait  PSYCH: Behavior calm and cooperative    Diagnostics:     Lab Results   Component Value Date    WBC 2.9 (L) 09/12/2021    HGB 9.9 (L) 09/12/2021    HCT 31.7 (L) 09/12/2021    PLT 153 09/12/2021       Lab Results   Component Value Date    NA 141 09/12/2021    K 4.3 09/12/2021    CL 107 09/12/2021    CO2 21.0 09/12/2021    BUN 13 09/12/2021    CREATININE 0.63 09/12/2021    GLU 181 (H) 09/12/2021    CALCIUM 9.4 09/12/2021  Lab Results   Component Value Date    BILITOT 2.2 (H) 09/12/2021    PROT 7.2 09/12/2021    ALBUMIN 2.9 (L) 09/12/2021    ALT 20 09/12/2021    AST 49 (H) 09/12/2021    ALKPHOS 131 (H) 09/12/2021       Lab Results   Component Value Date    PT 12.2 01/31/2011    INR 1.1 01/31/2011    APTT 30.7 01/31/2011        No results found for: LIPID, TSH, A1CPCT     I attest that I, Benita Stabile, personally documented this note while acting as scribe for Olena Leatherwood, NP.      Benita Stabile, Scribe.  03/06/2022     The documentation recorded by the scribe accurately reflects the service I personally performed and the decisions made by me.     Olena Leatherwood, NP    Dr. Betha Loa, DNP, FNP-BC  [facility] Primary Care at [facility] Certified Doctor of Nursing Practice   [phone]

## 2022-02-27 NOTE — Unmapped (Signed)
Rescheduled patient to April 6 at 1 pm for a 40 min appt with Dennard Nip

## 2022-03-04 DIAGNOSIS — E113212 Type 2 diabetes mellitus with mild nonproliferative diabetic retinopathy with macular edema, left eye: Principal | ICD-10-CM

## 2022-03-04 DIAGNOSIS — Z794 Long term (current) use of insulin: Principal | ICD-10-CM

## 2022-03-04 DIAGNOSIS — S3991XA Unspecified injury of abdomen, initial encounter: Principal | ICD-10-CM

## 2022-03-04 MED ORDER — CELECOXIB 200 MG CAPSULE
ORAL_CAPSULE | Freq: Two times a day (BID) | ORAL | 1 refills | 90.00000 days | PRN
Start: 2022-03-04 — End: 2023-03-04

## 2022-03-04 MED ORDER — TRULICITY 1.5 MG/0.5 ML SUBCUTANEOUS PEN INJECTOR
SUBCUTANEOUS | 6 refills | 28 days
Start: 2022-03-04 — End: ?

## 2022-03-04 NOTE — Unmapped (Signed)
Patient is requesting the following refill  Requested Prescriptions     Pending Prescriptions Disp Refills    celecoxib (CELEBREX) 200 MG capsule 180 capsule 1     Sig: Take 1 capsule (200 mg total) by mouth two (2) times a day as needed for pain.       Recent Visits  Date Type Provider Dept   02/11/22 Office Visit Desmond Dike, NP [place] [facility]   01/13/22 Office Visit Desmond Dike, NP [place] [facility]   09/12/21 Office Visit Desmond Dike, NP [place] [facility]   05/17/21 Office Visit Desmond Dike, NP [place] [facility]   05/02/21 Office Visit Deneise Lever, FNP [place] [facility] At [facility]   03/13/21 Office Visit Desmond Dike, NP [place] [facility]   Showing recent visits within past 365 days with a meds authorizing provider and meeting all other requirements  Future Appointments  Date Type Provider Dept   03/06/22 Appointment Desmond Dike, NP [place] [facility] [address] [facility]   03/14/22 Appointment Desmond Dike, NP [place] [facility] [address] At [facility]   05/13/22 Appointment Desmond Dike, NP  Primary Care [address] At [facility]   Showing future appointments within next 365 days with a meds authorizing provider and meeting all other requirements       Labs: Not applicable this refill

## 2022-03-05 MED ORDER — TRULICITY 1.5 MG/0.5 ML SUBCUTANEOUS PEN INJECTOR
SUBCUTANEOUS | 6 refills | 28 days | Status: CP
Start: 2022-03-05 — End: ?
  Filled 2022-03-05: qty 4, 28d supply, fill #0

## 2022-03-05 NOTE — Unmapped (Signed)
Patient is requesting the following refill  Requested Prescriptions     Signed Prescriptions Disp Refills    dulaglutide (TRULICITY) 1.5 mg/0.5 mL PnIj 4 mL 6     Sig: Inject the contents of 2 syringes (3 mg total) under the skin every seven (7) days.     Authorizing Provider: Jacqualine Code     Ordering User: Cathlean Cower       Recent Visits  Date Type Provider Dept   02/11/22 Office Visit Desmond Dike, NP [place] [facility]   01/13/22 Office Visit Desmond Dike, NP [place] [facility]   09/12/21 Office Visit Desmond Dike, NP [place] [facility]   05/17/21 Office Visit Desmond Dike, NP [place] [facility]   05/02/21 Office Visit Deneise Lever, FNP [place] [facility]   04/10/21 Office Visit Jacqualine Code, MD [place] [facility]   03/13/21 Office Visit Desmond Dike, NP [place] [facility]   Showing recent visits within past 365 days with a meds authorizing provider and meeting all other requirements  Future Appointments  Date Type Provider Dept   03/06/22 Appointment Desmond Dike, NP [place] [facility] [address] At [facility]   03/12/22 Appointment Jacqualine Code, MD [place] [facility]   03/14/22 Appointment Desmond Dike, NP [place] [facility] [address] [facility]   05/13/22 Appointment Desmond Dike, NP [place] [facility] [address] At [facility]   Showing future appointments within next 365 days with a meds authorizing provider and meeting all other requirements       Labs: A1c:   Hemoglobin A1C (%)   Date Value   01/13/2022 6.4 (A)    Creatinine:   Creatinine   Date Value   09/12/2021 0.63 mg/dL   16/09/9603 5.40 MG/DL

## 2022-03-06 ENCOUNTER — Ambulatory Visit: Admit: 2022-03-06 | Discharge: 2022-03-07 | Payer: PRIVATE HEALTH INSURANCE | Attending: Family | Primary: Family

## 2022-03-06 DIAGNOSIS — S3991XA Unspecified injury of abdomen, initial encounter: Principal | ICD-10-CM

## 2022-03-06 DIAGNOSIS — R102 Pelvic and perineal pain: Principal | ICD-10-CM

## 2022-03-06 DIAGNOSIS — E119 Type 2 diabetes mellitus without complications: Principal | ICD-10-CM

## 2022-03-06 LAB — CBC
HEMATOCRIT: 34.1 % (ref 34.0–44.0)
HEMOGLOBIN: 11.2 g/dL — ABNORMAL LOW (ref 11.3–14.9)
MEAN CORPUSCULAR HEMOGLOBIN CONC: 32.9 g/dL (ref 32.0–36.0)
MEAN CORPUSCULAR HEMOGLOBIN: 26.9 pg (ref 25.9–32.4)
MEAN CORPUSCULAR VOLUME: 81.6 fL (ref 77.6–95.7)
MEAN PLATELET VOLUME: 10.6 fL (ref 6.8–10.7)
PLATELET COUNT: 118 10*9/L — ABNORMAL LOW (ref 150–450)
RED BLOOD CELL COUNT: 4.18 10*12/L (ref 3.95–5.13)
RED CELL DISTRIBUTION WIDTH: 16 % — ABNORMAL HIGH (ref 12.2–15.2)
WBC ADJUSTED: 3.7 10*9/L (ref 3.6–11.2)

## 2022-03-06 LAB — COMPREHENSIVE METABOLIC PANEL
ALBUMIN: 3 g/dL — ABNORMAL LOW (ref 3.4–5.0)
ALKALINE PHOSPHATASE: 168 U/L — ABNORMAL HIGH (ref 46–116)
ALT (SGPT): 37 U/L (ref 10–49)
ANION GAP: 13 mmol/L (ref 5–14)
AST (SGOT): 95 U/L — ABNORMAL HIGH (ref <=34)
BILIRUBIN TOTAL: 1.3 mg/dL — ABNORMAL HIGH (ref 0.3–1.2)
BLOOD UREA NITROGEN: 13 mg/dL (ref 9–23)
BUN / CREAT RATIO: 22
CALCIUM: 9.3 mg/dL (ref 8.7–10.4)
CHLORIDE: 108 mmol/L — ABNORMAL HIGH (ref 98–107)
CO2: 22 mmol/L (ref 20.0–31.0)
CREATININE: 0.58 mg/dL — ABNORMAL LOW
EGFR CKD-EPI (2021) FEMALE: >90 mL/min/{1.73_m2} (ref >=60)
GLUCOSE RANDOM: 138 mg/dL (ref 70–179)
POTASSIUM: 3.9 mmol/L (ref 3.4–4.8)
PROTEIN TOTAL: 7.7 g/dL (ref 5.7–8.2)
SODIUM: 143 mmol/L (ref 135–145)

## 2022-03-06 MED ORDER — LORATADINE 10 MG TABLET
ORAL_TABLET | Freq: Every day | ORAL | 3 refills | 30 days | Status: CP
Start: 2022-03-06 — End: 2023-03-06
  Filled 2022-03-07: qty 30, 30d supply, fill #0

## 2022-03-06 MED ORDER — CELECOXIB 200 MG CAPSULE
ORAL_CAPSULE | Freq: Two times a day (BID) | ORAL | 1 refills | 90.00000 days | Status: CP | PRN
Start: 2022-03-06 — End: 2023-03-06
  Filled 2022-03-07: qty 60, 30d supply, fill #0

## 2022-03-10 NOTE — Unmapped (Unsigned)
[facility] Note:  [place], [place] [address]. Phone [phone]    03/10/2022    Patient Name:   Ashley Briggs    MRN: [id number]    Demographics:    Age-  56 y.o.     Date of Birth-  Apr 08, 1966    Chief complaint (CC):  No chief complaint on file.      Assessment/Plan:    Jenesys is a pleasant 56 y.o. female with persistent suprapubic abdominal/pelvic pain. Originally suspected interstitial cystitis given Hx of cystitis and Group B Strep, patient found no benefit with cephalexin 500 mg bid x10 days. Repeat POCT UA 03/06/22 with 3+ glucose, consistent with UA from 02/11/22. Endovaginal [place] completed 03/17/22 with noted findings of ***. CMP, CBC previously ordered to rule out metabolic contributor were reassuring, slightly diminished creatinine level (0.58) and elevated AST (95) and alkaline phosphatase (168) noted 03/06/22. Bilirubin and Albumin had improved. Patient is scheduled to establish with UROGYN 07/18/2022 for further workup. Advised pelvic exam, patient defers. Will defer to UROGYN.      {No diagnosis found. (Refresh or delete this SmartLink)}    30 minutes of clinical time > 1/2 the office visit was face to face time. We discussed medical, dietary, lifestyle, and health maintenance modifications to optimize health. Due to COVID-19, masks worn and standard precautions followed during visit. Medication adherence and barriers to the treatment plan have been addressed.  Patient voiced understanding, needs F/U visit as scheduled.    Health Maintenance:   Health Maintenance Due   Topic Date Due    Zoster Vaccines (1 of 2) Never done    COVID-19 Vaccine (3 - Moderna risk series) 07/26/2020     PHQ-2 Score:      PHQ-9 Score:      Inocente Salles Score:      {select_status_or_delete_smartlist:64641}    Subjective:    History of present illness (HPI):       Ashley Briggs is a 56 y.o. female who presented to [facility] for F/U pelvic pain. Patient reported ongoing, intermittent pelvic pressure and pain in my lower stomach/pelvis since 02/04/22 like the bottom of my abdomen is ripping at last visit 03/06/22. Symptoms did not improve with 10 day course of cephalexin 500 mg. S/P hysterectomy, her last Gynecology appointment was in 2007 per patient. Patient's medical history was reviewed, see Past Medical History. Medications used in the past were reviewed, see medications. Patient reports eating and eliminating well. Pt is sleeping well. Patient {has/not:18111::has not} required emergency room treatment for these symptoms, and {has/not:18111::has not} required hospitalization.     Denies HA, fever, chest pain, shortness of breath, N/V/D, bowel or bladder issues, vision or hearing changes, or swelling.     Relevant ROS: Reviewed 12 systems, positive findings listed, all others negative.    Pertinent Past Med Hx:    Past Medical History:   Diagnosis Date    Anxiety     Blepharitis     Cancer (CMS-HCC)     endometrial 2007    Diabetes mellitus (CMS-HCC)     Diabetic retinopathy (CMS-HCC)     Mild NPDR w/ME, left eye    Endometrial cancer (CMS-HCC) 2007    Gastroparesis     Heart disease     High cholesterol     Hypertension     Liver disease     Non-alcoholic fatty liver disease     NPDR (nonproliferative diabetic retinopathy) (CMS-HCC)     Right eye  Psoriasis     Ptosis of eyelid, left     Left upper eyelid    Reflux        Medications:       Current Outpatient Medications:     albuterol HFA 90 mcg/actuation inhaler, Inhale 2 puffs every six (6) hours as needed for shortness of breath. (Patient not taking: Reported on 01/13/2022), Disp: 18 g, Rfl: 2    amitriptyline (ELAVIL) 10 MG tablet, TAKE ONE TABLET BY MOUTH ONCE DAILY AT BEDTIME., Disp: , Rfl:     aspirin (ECOTRIN) 81 MG tablet, Take 1 tablet (81 mg total) by mouth daily., Disp: 90 tablet, Rfl: 3    blood-glucose meter Misc, Use as directed by provider., Disp: 1 each, Rfl: 0    brexpiprazole (REXULTI) 2 mg Tab, Take 1 tablet (2 mg total) by mouth daily., Disp: , Rfl:     canagliflozin (INVOKANA) 300 mg Tab tablet, Take 1 tablet (300 mg total) by mouth every morning before breakfast., Disp: 90 tablet, Rfl: 0    celecoxib (CELEBREX) 200 MG capsule, Take 1 capsule (200 mg total) by mouth two (2) times a day as needed for pain., Disp: 180 capsule, Rfl: 1    citalopram (CELEXA) 40 MG tablet, Take 1 tablet (40 mg total) by mouth daily., Disp: , Rfl:     clobetasoL (TEMOVATE) 0.05 % ointment, Apply topically Two (2) times a day. To areas of psoriasis (Patient not taking: Reported on 03/06/2022), Disp: 120 g, Rfl: 3    clonazePAM (KLONOPIN) 0.5 MG tablet, Take 1 tablet (0.5 mg total) by mouth daily., Disp: , Rfl:     dulaglutide (TRULICITY) 1.5 mg/0.5 mL PnIj, Inject the contents of 2 syringes (3 mg total) under the skin every seven (7) days., Disp: 4 mL, Rfl: 6    ferrous sulfate (SLOW FE) 142 mg (45 mg iron) TbER, Take 1 tablet (142 mg) by mouth two (2) times a day., Disp: 60 tablet, Rfl: 3    insulin glargine (LANTUS U-100 INSULIN) 100 unit/mL injection, Inject 0.66 mL (66 Units total) under the skin nightly., Disp: 60 mL, Rfl: 1    lancets 30 gauge Misc, Use as directed to test once daily., Disp: 100 each, Rfl: 3    lisinopriL (PRINIVIL,ZESTRIL) 40 MG tablet, Take 1 tablet (40 mg total) by mouth in the morning., Disp: 90 tablet, Rfl: 1    loratadine (CLARITIN) 10 mg tablet, Take 1 tablet (10 mg total) by mouth daily. For allergy symptoms., Disp: 30 tablet, Rfl: 3    metFORMIN (GLUCOPHAGE-XR) 500 MG 24 hr tablet, Take 2 tablets (1,000 mg total) by mouth in the morning and 2 tablets (1,000 mg total) in the evening. Take with meals., Disp: 360 tablet, Rfl: 1    pantoprazole (PROTONIX) 40 MG tablet, Take 1 tablet (40 mg total) by mouth Two (2) times a day., Disp: 180 tablet, Rfl: 1    simvastatin (ZOCOR) 20 MG tablet, Take 1 tablet (20 mg total) by mouth every evening., Disp: 90 tablet, Rfl: 1    tolterodine (DETROL LA) 4 MG 24 hr capsule, Take 1 capsule (4 mg total) by mouth daily., Disp: 90 capsule, Rfl: 2    triamcinolone (KENALOG) 0.1 % cream, Apply topically Two (2) times a day., Disp: 480 g, Rfl: 1    ustekinumab (STELARA) 90 mg/mL Syrg syringe, Inject contents of one syringe (90mg ) under skin every 12 weeks, Disp: 1 mL, Rfl: 3    ustekinumab (STELARA) 90 mg/mL Syrg syringe, Inject contents  of one syringe (90mg ) under the skin at week 0 and week 4. THEN 1 syringe (90mg ) every 12 weeks thereafter., Disp: 2 mL, Rfl: 0     Allergies:   No Known Allergies    Pertinent Social Hx and Habits: EMR reviewed    Pertinent Family Hx: EMR reviewed    Objective:      BP Readings from Last 3 Encounters:   03/06/22 100/50   02/11/22 120/50   01/13/22 102/58        There were no vitals filed for this visit.     Physical Exam:    General Appearance: WDWN in NAD      Skin: W, D, I  HEENT: PERRLA, EOMI, TM's clear  Respiratory: Clear throughout  Cardio: RRR  Abdomen: Soft and non-tnder  Neurologic: A & O X 4, Grossly intact, stable gait  PSYCH: Behavior calm and cooperative    Diagnostics:     Lab Results   Component Value Date    WBC 3.7 03/06/2022    HGB 11.2 (L) 03/06/2022    HCT 34.1 03/06/2022    PLT 118 (L) 03/06/2022       Lab Results   Component Value Date    NA 143 03/06/2022    K 3.9 03/06/2022    CL 108 (H) 03/06/2022    CO2 22.0 03/06/2022    BUN 13 03/06/2022    CREATININE 0.58 (L) 03/06/2022    GLU 138 03/06/2022    CALCIUM 9.3 03/06/2022       Lab Results   Component Value Date    BILITOT 1.3 (H) 03/06/2022    PROT 7.7 03/06/2022    ALBUMIN 3.0 (L) 03/06/2022    ALT 37 03/06/2022    AST 95 (H) 03/06/2022    ALKPHOS 168 (H) 03/06/2022       Lab Results   Component Value Date    PT 12.2 01/31/2011    INR 1.1 01/31/2011    APTT 30.7 01/31/2011        No results found for: LIPID, TSH, A1CPCT     I attest that I, Benita Stabile, personally documented this note while acting as scribe for Olena Leatherwood, NP.      Benita Stabile, Scribe.  03/14/2022     The documentation recorded by the scribe accurately reflects the service I personally performed and the decisions made by me.     Olena Leatherwood, NP    Dr. Betha Loa, DNP, FNP-BC  [facility] Certified Doctor of Nursing Practice   [phone]

## 2022-03-12 ENCOUNTER — Ambulatory Visit
Admit: 2022-03-12 | Discharge: 2022-03-13 | Payer: PRIVATE HEALTH INSURANCE | Attending: "Endocrinology | Primary: "Endocrinology

## 2022-03-12 DIAGNOSIS — Z794 Long term (current) use of insulin: Principal | ICD-10-CM

## 2022-03-12 DIAGNOSIS — E113212 Type 2 diabetes mellitus with mild nonproliferative diabetic retinopathy with macular edema, left eye: Principal | ICD-10-CM

## 2022-03-12 MED ORDER — CANAGLIFLOZIN 300 MG TABLET
ORAL_TABLET | Freq: Every day | ORAL | 0 refills | 90 days
Start: 2022-03-12 — End: ?

## 2022-03-12 NOTE — Unmapped (Signed)
Diabetes is well controlled with current medication regimen and lifestyle. Congratulated patient on greatly improved A1c and weight loss. A1c has improved from 11.3 in October 2022 to 6.4 most recently in February 2023. She has also lost 35 lb in the last year with combination of dietary modifications and addition of GLP-1 inhibitor.     Patient report distant history of pancreatitis. Discussed that all GLP-1 receptor agonists slow gastric emptying and have risk of pancreatitis. Explained if pancreatitis occurs this should be stopped and not restarted. Patient is current asymptomatic and in light of great recent improvement benefit outweighs risk at present so will continue on Trulicity. Advised to report any new recurrence of symptoms.     Will hold off on any medication changes right now as regimen has greatly improved blood sugar and weight loss effort. If improvement continues, may consider reducing Invokana. Recent UA negative for acute UTI and patient denies bladder pain.     Lab Results   Component Value Date    A1C 6.4 (A) 01/13/2022    A1C 11.3 (H) 09/12/2021    A1C 8.9 (A) 04/10/2021      Wt Readings from Last 12 Encounters:   03/12/22 (!) 104.2 kg (229 lb 12.8 oz)   03/06/22 (!) 107 kg (236 lb)   02/11/22 (!) 107.5 kg (237 lb)   01/23/22 (!) 106.5 kg (234 lb 12.8 oz)   01/13/22 (!) 107.5 kg (237 lb)   09/12/21 (!) 114.8 kg (253 lb)   05/17/21 (!) 120.2 kg (265 lb)   05/02/21 (!) 118.4 kg (261 lb)   04/10/21 (!) 114.8 kg (253 lb)   04/10/21 (!) 114.8 kg (253 lb)   03/13/21 (!) 121.6 kg (268 lb)   01/09/21 (!) 119.3 kg (263 lb)     Last diabetic foot exam:09/12/21    Last diabetic retinal exam:06/17/2021    Plan:  - Continue Trulicity 3 mg weekly, Invokana 300 mg before breakfast, metformin 1000 mg BID with meals, Lantus 66 units QHS.   - Encouraged patient to check blood sugars regularly and to record these numbers as well as date/time taken  - Encouraged healthy diet and activity

## 2022-03-12 NOTE — Unmapped (Signed)
Endocrinology Follow-up      Ashley Briggs is a 56 y.o. female seen in Endocrinology consultation follow-up on 03/12/22 for Diabetes    Patient continues on Trulicity 3 mg weekly, Invokana 300 mg before breakfast, metformin 1000 mg BID with meals, Lantus 66 units QHS. No side effects noted. No refills needed today. Last A1c was 6.4 on 01/13/22. POCT glucose in clinic today is 138. Patient is not fasting today. She had a banana before coming in.  Denies foot ulcerations, hyperglycemia, hypoglycemia , and polydipsia.     Patient checks finger stick blood sugars fasting to couple hours after eating regularly. Last week had a fasting 78 and is usually well controlled after meals. She does report one hyperglycemic episode last week with BS 238 couple hrs after eating.     Her ability to exercise is limited due to bilateral knee arthritis however she walks her dog and takes stairs in her home daily.     Patient has been following with PCP for pelvic/bladder pain however controlled at present. UA was negative for infection. She has been referred to gynecology and is scheduled for an endovaginal [place] this month.       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  Patient will continue good management of diabetes especially with weight loss. Hopefully with further weight loss will run into the problem of being overmedicated and having to cut back on medications    Some confusion on diabetes medications was have not.been refilled for over a year.  Patient still getting prescriptions will not adjust these medicines    Assessment & Plan:  Diabetes is well controlled with current medication regimen and lifestyle. Congratulated patient on greatly improved A1c and weight loss. A1c has improved from 11.3 in October 2022 to 6.4 most recently in February 2023. She has also lost 35 lb in the last year with combination of dietary modifications and addition of GLP-1 inhibitor.     Patient report distant history of pancreatitis. Discussed that all GLP-1 receptor agonists slow gastric emptying and have risk of pancreatitis. Explained if pancreatitis occurs this should be stopped and not restarted. Patient is current asymptomatic and in light of great recent improvement benefit outweighs risk at present so will continue on Trulicity. Advised to report any new recurrence of symptoms.     Will hold off on any medication changes right now as regimen has greatly improved blood sugar and weight loss effort. If improvement continues, may consider reducing Invokana. Recent UA negative for acute UTI and patient denies bladder pain.     Lab Results   Component Value Date    A1C 6.4 (A) 01/13/2022    A1C 11.3 (H) 09/12/2021    A1C 8.9 (A) 04/10/2021      Wt Readings from Last 12 Encounters:   03/12/22 (!) 104.2 kg (229 lb 12.8 oz)   03/06/22 (!) 107 kg (236 lb)   02/11/22 (!) 107.5 kg (237 lb)   01/23/22 (!) 106.5 kg (234 lb 12.8 oz)   01/13/22 (!) 107.5 kg (237 lb)   09/12/21 (!) 114.8 kg (253 lb)   05/17/21 (!) 120.2 kg (  265 lb)   05/02/21 (!) 118.4 kg (261 lb)   04/10/21 (!) 114.8 kg (253 lb)   04/10/21 (!) 114.8 kg (253 lb)   03/13/21 (!) 121.6 kg (268 lb)   01/09/21 (!) 119.3 kg (263 lb)     Last diabetic foot exam:09/12/21    Last diabetic retinal exam:06/17/2021    Plan:  - Continue Trulicity 3 mg weekly, Invokana 300 mg before breakfast, metformin 1000 mg BID with meals, Lantus 66 units QHS.   - Encouraged patient to check blood sugars regularly and to record these numbers as well as date/time taken  - Encouraged healthy diet and activity      Orders:  -     POCT glucose        Return in about 6 months (around 09/11/2022) for diabetes follow up .    There are no Patient Instructions on file for this visit.      Subjective:     History of Present Illness: See problem oriented charting.    Past Medical History:   Diagnosis Date    Anxiety     Blepharitis     Cancer (CMS-HCC)     endometrial 2007    Diabetes mellitus (CMS-HCC)     Diabetic retinopathy (CMS-HCC)     Mild NPDR w/ME, left eye    Endometrial cancer (CMS-HCC) 2007    Gastroparesis     Heart disease     High cholesterol     Hypertension     Liver disease     Non-alcoholic fatty liver disease     NPDR (nonproliferative diabetic retinopathy) (CMS-HCC)     Right eye    Psoriasis     Ptosis of eyelid, left     Left upper eyelid    Reflux         Past Surgical History:   Procedure Laterality Date    CHOLECYSTECTOMY      HYSTERECTOMY      endometrial cancer    HYSTERECTOMY      OOPHORECTOMY      PR ANAL PRESSURE RECORD Left 03/31/2013    Procedure: ANORECTAL MANOMETRY;  Surgeon: None None;  [facility];  Service: Gastroenterology    PR BREATH HYDROGEN TEST N/A 03/31/2013    Procedure: BREATH HYDROGEN TEST;  Surgeon: None None;  [facility]: [facility];  Service: Gastroenterology    PR COLSC FLX W/RMVL OF TUMOR POLYP LESION SNARE TQ N/A 09/06/2020    Procedure: COLONOSCOPY FLEX; W/REMOV TUMOR/LES BY SNARE;  Surgeon: Chriss Driver, MD;  [facility]: [facility];  Service: Gastroenterology    PR UPPER GI ENDOSCOPY,BIOPSY N/A 04/15/2013    Procedure: UGI ENDOSCOPY; WITH BIOPSY, SINGLE OR MULTIPLE;  Surgeon: Vickii Chafe, MD;  [facility]: [facility];  Service: Gastroenterology    PR UPPER GI ENDOSCOPY,BIOPSY N/A 09/06/2020    Procedure: UGI ENDOSCOPY; WITH BIOPSY, SINGLE OR MULTIPLE;  Surgeon: Chriss Driver, MD;  [facility]: [facility];  Service: Gastroenterology       Current Outpatient Medications   Medication Sig Dispense Refill    amitriptyline (ELAVIL) 10 MG tablet TAKE ONE TABLET BY MOUTH ONCE DAILY AT BEDTIME.      aspirin (ECOTRIN) 81 MG tablet Take 1 tablet (81 mg total) by mouth daily. 90 tablet 3    blood-glucose meter Misc Use as directed by provider. 1 each 0    brexpiprazole (REXULTI) 2 mg Tab Take 1 tablet (2 mg  total) by mouth daily.      canagliflozin (INVOKANA) 300 mg Tab tablet Take 1 tablet (300 mg total) by mouth every morning before breakfast. 90 tablet 0    celecoxib (CELEBREX) 200 MG capsule Take 1 capsule (200 mg total) by mouth two (2) times a day as needed for pain. 180 capsule 1    citalopram (CELEXA) 40 MG tablet Take 1 tablet (40 mg total) by mouth daily.      clobetasoL (TEMOVATE) 0.05 % ointment Apply topically Two (2) times a day. To areas of psoriasis 120 g 3    clonazePAM (KLONOPIN) 0.5 MG tablet Take 1 tablet (0.5 mg total) by mouth daily.      dulaglutide (TRULICITY) 1.5 mg/0.5 mL PnIj Inject the contents of 2 syringes (3 mg total) under the skin every seven (7) days. 4 mL 6    ferrous sulfate (SLOW FE) 142 mg (45 mg iron) TbER Take 1 tablet (142 mg) by mouth two (2) times a day. 60 tablet 3    insulin glargine (LANTUS U-100 INSULIN) 100 unit/mL injection Inject 0.66 mL (66 Units total) under the skin nightly. 60 mL 1    lancets 30 gauge Misc Use as directed to test once daily. 100 each 3    lisinopriL (PRINIVIL,ZESTRIL) 40 MG tablet Take 1 tablet (40 mg total) by mouth in the morning. 90 tablet 1    loratadine (CLARITIN) 10 mg tablet Take 1 tablet (10 mg total) by mouth daily. For allergy symptoms. 30 tablet 3    metFORMIN (GLUCOPHAGE-XR) 500 MG 24 hr tablet Take 2 tablets (1,000 mg total) by mouth in the morning and 2 tablets (1,000 mg total) in the evening. Take with meals. 360 tablet 1    pantoprazole (PROTONIX) 40 MG tablet Take 1 tablet (40 mg total) by mouth Two (2) times a day. 180 tablet 1    simvastatin (ZOCOR) 20 MG tablet Take 1 tablet (20 mg total) by mouth every evening. 90 tablet 1    tolterodine (DETROL LA) 4 MG 24 hr capsule Take 1 capsule (4 mg total) by mouth daily. 90 capsule 2    triamcinolone (KENALOG) 0.1 % cream Apply topically Two (2) times a day. 480 g 1    ustekinumab (STELARA) 90 mg/mL Syrg syringe Inject contents of one syringe (90mg ) under skin every 12 weeks 1 mL 3    ustekinumab (STELARA) 90 mg/mL Syrg syringe Inject contents of one syringe (90mg ) under the skin at week 0 and week 4. THEN 1 syringe (90mg ) every 12 weeks thereafter. 2 mL 0    albuterol HFA 90 mcg/actuation inhaler Inhale 2 puffs every six (6) hours as needed for shortness of breath. (Patient not taking: Reported on 03/12/2022) 18 g 2     No current facility-administered medications for this visit.        No Known Allergies    Family History   Problem Relation Age of Onset    Diabetes Mother     Cancer Mother     No Known Problems Father     No Known Problems Sister     Diabetes Maternal Grandmother     Hypertension Maternal Grandmother     Cancer Maternal Grandfather     Diabetes Maternal Grandfather     No Known Problems Paternal Grandmother     Skin cancer Paternal Grandfather     No Known Problems Brother     No Known Problems Maternal Aunt     No Known Problems Maternal  Uncle     No Known Problems Paternal Aunt     No Known Problems Paternal Uncle     No Known Problems Other     Melanoma Neg Hx     Basal cell carcinoma Neg Hx     Squamous cell carcinoma Neg Hx     Substance Abuse Disorder Neg Hx     Alcohol abuse Neg Hx     Drug abuse Neg Hx     Mental illness Neg Hx     Amblyopia Neg Hx     Blindness Neg Hx     Cataracts Neg Hx     Glaucoma Neg Hx     Macular degeneration Neg Hx     Retinal detachment Neg Hx     Strabismus Neg Hx     Stroke Neg Hx     Thyroid disease Neg Hx     Breast cancer Neg Hx            Review of Systems -  negative except as noted in HPI.     Objective:     Physical Exam:  BP 100/54  - Pulse 77  - Temp 36.8 ??C (98.3 ??F) (Oral)  - Resp 19  - Wt (!) 104.2 kg (229 lb 12.8 oz)  - LMP 08/03/2006  - SpO2 98%  - BMI 39.45 kg/m??   General appearance - alert, well appearing, and in no distress.  Eyes - No lid lag or stare, no proptosis, EOM's intact.  Mouth - mucous membranes moist.  Neck - supple, thyroid normal in size without nodules or tenderness.  Lymphatics - no cervical or supraclavicular adenopathy appreciated.  Chest - clear, good excursion.  Heart - normal rate, regular rhythm.  Abdomen - non-distended, soft.   Neurological - no hand tremors. Normal gait.  Extremities - extremities warm. No lower extremity edema.  Skin - warm, dry, no visible rashes.  Psych - Normal mood, appropriate affect.  Muskuloskeletal - No kyphosis or spine tenderness.      Lab Review:  I've reviewed the patient's most recent pertinent labs in the electronic record and provided records.  No results found for: TSH, T3TOTAL, T4TOTAL, FREET4, TPERXAB, THYROG2NDGEN, THYAC      No results found for: TSH, FREET4, T4TOTAL, FREET3, T3TOTAL, THYROG2NDGEN, THYIAINT, LABTHYR, THYROIDAB, THYROGLB    Lab Results   Component Value Date    CALCIUM 9.3 03/06/2022    CALCIUM 9.4 09/12/2021    CALCIUM 9.8 03/13/2021    ALBUMIN 3.0 (L) 03/06/2022    CREATININE 0.58 (L) 03/06/2022       Lab Results   Component Value Date    A1C 6.4 (A) 01/13/2022    GLU 138 03/06/2022    CREATININE 0.58 (L) 03/06/2022    ALBCRERAT  02/11/2022      Comment:      Unable to calculate.    ALBQTUR <0.3 02/11/2022    CHOL 122 09/12/2021    HDL 39 (L) 09/12/2021    NONHDL 83 09/12/2021    LDL 62 09/12/2021    TRIG 161 09/12/2021          Radiology:    Thyroid US:  No results found for this or any previous visit.     No results found for this or any previous visit.     No results found for this or any previous visit.       DEXA:    No results found for this or any previous visit.  Other Medical Data :       I've personally reviewed and summarized records in EPIC/Media and via CareEverywhere as well as medication, allergies, past medical, social, and family history.     I attest that I, Dorena Dew, personally documented this note while acting as scribe for Marvia Pickles, MD.      Dorena Dew, Scribe.  03/12/2022     The documentation recorded by the scribe accurately reflects the service I personally performed and the decisions made by me.    Marvia Pickles, MD

## 2022-03-17 ENCOUNTER — Other Ambulatory Visit: Payer: Self-pay

## 2022-03-17 ENCOUNTER — Ambulatory Visit: Admit: 2022-03-17 | Discharge: 2022-03-18 | Payer: PRIVATE HEALTH INSURANCE

## 2022-03-17 NOTE — Unmapped (Signed)
Hello Ashley Briggs,    I have reviewed your lab testing and several results were stable and out of normal range.    Please complete ultrasound follow up with Urology/gynecology as discussed.    We will continue monitoring your care    Take Care,  Dr. Nira Retort

## 2022-03-18 NOTE — Unmapped (Signed)
Unable to contact patient, voicemail is full and no answer

## 2022-03-18 NOTE — Unmapped (Signed)
Hello Ashley Briggs,    I have reviewed your imaging result.    No acute finding noted.    Please follow-up with my office with any questions or concerns.    Take Care,  Dr. Nira Retort

## 2022-03-19 DIAGNOSIS — I2510 Atherosclerotic heart disease of native coronary artery without angina pectoris: Principal | ICD-10-CM

## 2022-03-19 DIAGNOSIS — I10 Essential (primary) hypertension: Principal | ICD-10-CM

## 2022-03-19 MED FILL — INSULIN GLARGINE (U-100) 100 UNIT/ML SUBCUTANEOUS SOLUTION: SUBCUTANEOUS | 30 days supply | Qty: 20 | Fill #3

## 2022-03-19 NOTE — Unmapped (Signed)
Unable to contact patient, no answer and voicemail full.

## 2022-03-25 NOTE — Unmapped (Signed)
[facility] Telephonic/Telehealth noteDan [place], [place] [address]. Phone [phone]    Date of Service:  03/26/2022    Ashley Briggs    Date of Birth:  17-Feb-1966    Chief Complaint   Patient presents with    Depression    Anxiety     Patient was seeing [facility] in [place] but they are not returning calls or refilling prescriptions.        I spent 30 minutes on the phone with the patient. I spent an additional 10 minutes on pre- and post-visit activities.     The patient was physically located in [place] or a state in which I am permitted to provide care. The visit was reasonable and appropriate under the circumstances given the patient's presentation at the time.    She has been advised of the potential risks and limitations of this mode of treatment (including, but not limited to, the absence of in-person examination) and has agreed to be treated using telemedicine. The patient's/patient's family's questions regarding telemedicine have been answered.     She has been advised to contact their provider???s office for worsening conditions, and seek emergency medical treatment and/or call 911 if the patient deems either necessary.      Assessment/Plan:    Ashley Briggs is a pleasant 56 y.o. female with Hx of Anxiety and Depression. Patient repots difficulty scheduling an appointment or receiving refills through her current Psychiatrist [facility] in [place]) and requests referral to an alternate provider that accepts Medicaid, preferably in [place]. Referral to [facility] placed for patient today, advised she contact their office regarding insurance coverage. Patient has been without her Rexulti 2 mg for the past 2 months, refill provided however I recommended she start with 1/2 tablet (1 mg) x1 week before increasing to 2 mg daily maintenance dose. Continue Celexa 40 mg daily and clonazepam 0.5 mg daily. Positive mindfulness activities reviewed, encouraged patient to increase her level of physical activity and set regular daily schedule. Suggested she consider volunteering and spending more time out of her home. Crisis resources reviewed, patient contracted for safety. ER precautions reviewed, follow up as scheduled 05/13/22, sooner prn.      Diagnosis ICD-10-CM Associated Orders   1. Other depression  F32.89 Ambulatory referral to Psychiatry      2. History of anxiety  Z86.59 Ambulatory referral to Psychiatry             HPI:   Ashley Briggs is a 56 y.o. female who presented to [facility] for F/U anxiety and depression. Patient reports she would like a referral to another Psychiatrist as the place I have been going to has really dropped the ball. I'm not getting my meds and they have cancelled several appointment on me. She indicates she was following with [facility] in [place] however they have reduced staff and are now only open a few days a week. She has not been sleeping very well and her mood has continued to worsen. She notes she has been without her Rexulti 2 mg daily for the past 2 months. She continues to take Celexa 40 mg daily and clonazepam 0.5 mg daily. She last used her Klonopin 1 week ago, not as effective as Rexulti. Currently not doing much with her time, patient does not work and stays home often. She does endorse having supportive friends/family. Patient's medical history was reviewed, see Past Medical History. Medications used in the past  were reviewed, see medications. Patient reports eating and eliminating well. Denies drinking alcohol, expresses she is an emotional eater and has been eating more often recently. Patient has not required recent emergency room treatment for these symptoms, and has not required recent hospitalization.     Denies HA, fever, chest pain, shortness of breath, N/V/D, bowel or bladder issues, vision or hearing changes, or swelling.     ROS: 12 systems reviewed, no other listed complaints.    Pertinent Past Med Hx:    Past Medical History:   Diagnosis Date    Anxiety     Blepharitis     Cancer (CMS-HCC)     endometrial 2007    Diabetes mellitus (CMS-HCC)     Diabetic retinopathy (CMS-HCC)     Mild NPDR w/ME, left eye    Endometrial cancer (CMS-HCC) 2007    Gastroparesis     Heart disease     High cholesterol     Hypertension     Liver disease     Non-alcoholic fatty liver disease     NPDR (nonproliferative diabetic retinopathy) (CMS-HCC)     Right eye    Psoriasis     Ptosis of eyelid, left     Left upper eyelid    Reflux        Medications:     Current Outpatient Medications:     amitriptyline (ELAVIL) 10 MG tablet, TAKE ONE TABLET BY MOUTH ONCE DAILY AT BEDTIME., Disp: , Rfl:     aspirin (ECOTRIN) 81 MG tablet, Take 1 tablet (81 mg total) by mouth daily., Disp: 90 tablet, Rfl: 3    blood-glucose meter Misc, Use as directed by provider., Disp: 1 each, Rfl: 0    canagliflozin (INVOKANA) 300 mg Tab tablet, Take 1 tablet (300 mg total) by mouth every morning before breakfast., Disp: 90 tablet, Rfl: 0    celecoxib (CELEBREX) 200 MG capsule, Take 1 capsule (200 mg total) by mouth two (2) times a day as needed for pain., Disp: 180 capsule, Rfl: 1    citalopram (CELEXA) 40 MG tablet, Take 1 tablet (40 mg total) by mouth daily., Disp: , Rfl:     dulaglutide (TRULICITY) 1.5 mg/0.5 mL PnIj, Inject the contents of 2 syringes (3 mg total) under the skin every seven (7) days., Disp: 4 mL, Rfl: 6    ferrous sulfate (SLOW FE) 142 mg (45 mg iron) TbER, Take 1 tablet (142 mg) by mouth two (2) times a day., Disp: 60 tablet, Rfl: 3    insulin glargine (LANTUS U-100 INSULIN) 100 unit/mL injection, Inject 0.66 mL (66 Units total) under the skin nightly., Disp: 60 mL, Rfl: 1    lancets 30 gauge Misc, Use as directed to test once daily., Disp: 100 each, Rfl: 3    lisinopriL (PRINIVIL,ZESTRIL) 40 MG tablet, Take 1 tablet (40 mg total) by mouth in the morning., Disp: 90 tablet, Rfl: 1    loratadine (CLARITIN) 10 mg tablet, Take 1 tablet (10 mg total) by mouth daily. For allergy symptoms., Disp: 30 tablet, Rfl: 3    metFORMIN (GLUCOPHAGE-XR) 500 MG 24 hr tablet, Take 2 tablets (1,000 mg total) by mouth in the morning and 2 tablets (1,000 mg total) in the evening. Take with meals., Disp: 360 tablet, Rfl: 1    pantoprazole (PROTONIX) 40 MG tablet, Take 1 tablet (40 mg total) by mouth Two (2) times a day., Disp: 180 tablet, Rfl: 1    simvastatin (ZOCOR) 20 MG tablet, Take 1 tablet (20  mg total) by mouth every evening., Disp: 90 tablet, Rfl: 1    tolterodine (DETROL LA) 4 MG 24 hr capsule, Take 1 capsule (4 mg total) by mouth daily., Disp: 90 capsule, Rfl: 2    ustekinumab (STELARA) 90 mg/mL Syrg syringe, Inject contents of one syringe (90mg ) under skin every 12 weeks, Disp: 1 mL, Rfl: 3    ustekinumab (STELARA) 90 mg/mL Syrg syringe, Inject contents of one syringe (90mg ) under the skin at week 0 and week 4. THEN 1 syringe (90mg ) every 12 weeks thereafter., Disp: 2 mL, Rfl: 0    albuterol HFA 90 mcg/actuation inhaler, Inhale 2 puffs every six (6) hours as needed for shortness of breath. (Patient not taking: Reported on 03/12/2022), Disp: 18 g, Rfl: 2    brexpiprazole (REXULTI) 2 mg Tab, Take 0.5 tablets (1 mg total) by mouth daily. Increase to 1 tablet after 1 week., Disp: 30 tablet, Rfl: 6    clobetasoL (TEMOVATE) 0.05 % ointment, Apply topically Two (2) times a day. To areas of psoriasis (Patient not taking: Reported on 03/26/2022), Disp: 120 g, Rfl: 3    clonazePAM (KLONOPIN) 0.5 MG tablet, Take 1 tablet (0.5 mg total) by mouth nightly as needed for anxiety. And sleep. May repeat dose x 1., Disp: 40 tablet, Rfl: 1    triamcinolone (KENALOG) 0.1 % cream, Apply topically Two (2) times a day. (Patient not taking: Reported on 03/26/2022), Disp: 480 g, Rfl: 1     Allergies:   No Known Allergies    Diag:    Lab Results   Component Value Date    WBC 3.7 03/06/2022    HGB 11.2 (L) 03/06/2022    HCT 34.1 03/06/2022    PLT 118 (L) 03/06/2022       Lab Results   Component Value Date    NA 143 03/06/2022    K 3.9 03/06/2022    CL 108 (H) 03/06/2022    CO2 22.0 03/06/2022    BUN 13 03/06/2022    CREATININE 0.58 (L) 03/06/2022    GLU 138 03/06/2022    CALCIUM 9.3 03/06/2022       Lab Results   Component Value Date    BILITOT 1.3 (H) 03/06/2022    PROT 7.7 03/06/2022    ALBUMIN 3.0 (L) 03/06/2022    ALT 37 03/06/2022    AST 95 (H) 03/06/2022    ALKPHOS 168 (H) 03/06/2022       Lab Results   Component Value Date    PT 12.2 01/31/2011    INR 1.1 01/31/2011    APTT 30.7 01/31/2011        I attest that I, Benita Stabile, personally documented this note while acting as scribe for Olena Leatherwood, NP.      Benita Stabile, Scribe.  03/26/2022     The documentation recorded by the scribe accurately reflects the service I personally performed and the decisions made by me.     Olena Leatherwood, NP    Dr. Betha Loa, DNP, FNP-BC  Board Certified Doctor of Nursing Practice   [phone]

## 2022-03-26 ENCOUNTER — Institutional Professional Consult (permissible substitution): Admit: 2022-03-26 | Discharge: 2022-03-27 | Payer: PRIVATE HEALTH INSURANCE | Attending: Family | Primary: Family

## 2022-03-26 DIAGNOSIS — Z8659 Personal history of other mental and behavioral disorders: Principal | ICD-10-CM

## 2022-03-26 DIAGNOSIS — F3289 Other specified depressive episodes: Principal | ICD-10-CM

## 2022-03-26 MED ORDER — BREXPIPRAZOLE 2 MG TABLET
ORAL_TABLET | Freq: Every day | ORAL | 6 refills | 60 days | Status: CP
Start: 2022-03-26 — End: ?

## 2022-03-26 MED ORDER — CLONAZEPAM 0.5 MG TABLET
ORAL_TABLET | Freq: Every evening | ORAL | 1 refills | 40 days | Status: CP | PRN
Start: 2022-03-26 — End: ?

## 2022-03-31 DIAGNOSIS — F3289 Other specified depressive episodes: Principal | ICD-10-CM

## 2022-03-31 DIAGNOSIS — Z8659 Personal history of other mental and behavioral disorders: Principal | ICD-10-CM

## 2022-04-08 DIAGNOSIS — F3289 Other specified depressive episodes: Principal | ICD-10-CM

## 2022-04-08 DIAGNOSIS — I10 Essential (primary) hypertension: Principal | ICD-10-CM

## 2022-04-08 DIAGNOSIS — Z8659 Personal history of other mental and behavioral disorders: Principal | ICD-10-CM

## 2022-04-09 MED FILL — SIMVASTATIN 20 MG TABLET: ORAL | 90 days supply | Qty: 90 | Fill #1

## 2022-04-09 MED FILL — LISINOPRIL 40 MG TABLET: ORAL | 90 days supply | Qty: 90 | Fill #1

## 2022-04-09 MED FILL — TRULICITY 1.5 MG/0.5 ML SUBCUTANEOUS PEN INJECTOR: SUBCUTANEOUS | 28 days supply | Qty: 4 | Fill #1

## 2022-04-09 MED FILL — ASPIRIN 81 MG TABLET,DELAYED RELEASE: ORAL | 90 days supply | Qty: 90 | Fill #2

## 2022-04-15 MED FILL — INSULIN GLARGINE (U-100) 100 UNIT/ML SUBCUTANEOUS SOLUTION: SUBCUTANEOUS | 30 days supply | Qty: 20 | Fill #4

## 2022-04-16 NOTE — Unmapped (Signed)
The [facility] has made a second and final attempt to reach this patient to refill the following medication:Stelara.      We have left voicemails on the following phone numbers: [phone], have sent a MyChart message and have sent a text message to the following phone numbers: [phone].    Dates contacted: 5/12 and 5/17  Last scheduled delivery: 2/27    The patient may be at risk of non-compliance with this medication. The patient should call the [facility] at [phone]  Option 4, then Option 2 (all other specialty patients) to refill medication.    Ashley Briggs D Administrator Shared [facility] Specialty Technician

## 2022-04-17 NOTE — Unmapped (Signed)
[facility] Refill Coordination Note    Specialty Medication(s) to be Shipped:   Inflammatory Disorders: Stelara    Other medication(s) to be shipped: No additional medications requested for fill at this time     Ashley Briggs, DOB: 10/31/1966  Phone: There are no phone numbers on file.      All above HIPAA information was verified with patient.     Was a Nurse, learning disability used for this call? No    Completed refill call assessment today to schedule patient's medication shipment from the [facility] [phone]).  All relevant notes have been reviewed.     Specialty medication(s) and dose(s) confirmed: Regimen is correct and unchanged.   Changes to medications: [name] reports no changes at this time.  Changes to insurance: No  New side effects reported not previously addressed with a pharmacist or physician: None reported  Questions for the pharmacist: No    Confirmed patient received a Conservation officer, historic buildings and a Surveyor, mining with first shipment. The patient will receive a drug information handout for each medication shipped and additional FDA Medication Guides as required.       DISEASE/MEDICATION-SPECIFIC INFORMATION        For patients on injectable medications: Patient currently has 0 doses left.  Next injection is scheduled for 04/21/22.    SPECIALTY MEDICATION ADHERENCE     Medication Adherence    Patient reported X missed doses in the last month: 0  Specialty Medication: STELARA 90 mg/mL  Patient is on additional specialty medications: No  Any gaps in refill history greater than 2 weeks in the last 3 months: no  Demonstrates understanding of importance of adherence: yes  Informant: patient  Reliability of informant: reliable  Confirmed plan for next specialty medication refill: delivery by pharmacy  Refills needed for supportive medications: not needed              Were doses missed due to medication being on hold? No    Stelara 90 mg/ml: 0 days of medicine on hand REFERRAL TO PHARMACIST     Referral to the pharmacist: Not needed      [facility]     Shipping address confirmed in Epic.     Delivery Scheduled: Yes, Expected medication delivery date: 04/18/22.     Medication will be delivered via Same Day Courier to the prescription address in Epic WAM.    Unk Lightning   [facility] Specialty Technician

## 2022-04-18 MED FILL — STELARA 90 MG/ML SUBCUTANEOUS SYRINGE: 34 days supply | Qty: 1 | Fill #0

## 2022-04-23 NOTE — Unmapped (Signed)
-----   Message from Abbie Sons, MD sent at 04/22/2022  5:23 PM EDT -----  Regarding: First available  Hi, could we please call and schedule this patient for a first available follow up with me? Thanks!

## 2022-04-29 NOTE — Progress Notes (Deleted)
Psychiatric Initial Adult Assessment   Patient Identification: Abigail Molina MRN:  [id number] Date of Evaluation:  04/29/2022 Referral Source: *** Chief Complaint:  No chief complaint on file.  Visit Diagnosis: No diagnosis found.  History of Present Illness:   Abigail Molina is a 56 y.o. year old female with a history of depression, type II diabetes, hypertension, hyperlipidemia. Fatty liver disease, psoriasis, endometrial cancer, pancreatitis, ,who is referred for    Daily routine: Diet:  Exercise: Support: Household:  Marital status: Number of children: Employment:  Education:   Last PCP / ongoing medical evaluation:           Associated Signs/Symptoms: Depression Symptoms:  {DEPRESSION SYMPTOMS:20000} (Hypo) Manic Symptoms:  {BHH MANIC SYMPTOMS:22872} Anxiety Symptoms:  {BHH ANXIETY SYMPTOMS:22873} Psychotic Symptoms:  {BHH PSYCHOTIC SYMPTOMS:22874} PTSD Symptoms: {BHH PTSD SYMPTOMS:22875}  Past Psychiatric History:  Outpatient:  Psychiatry admission:  Previous suicide attempt:  Past trials of medication:  History of violence:    Previous Psychotropic Medications: {YES/NO:21197}  Substance Abuse History in the last 12 months:  {yes no:314532}  Consequences of Substance Abuse: {BHH CONSEQUENCES OF SUBSTANCE ABUSE:22880}  Past Medical History:  Past Medical History:  Diagnosis Date   Cancer ([place])    Endometrial cancer   Depression    Diabetes mellitus without complication ([place])    Fatty liver disease, nonalcoholic 7371   GERD (gastroesophageal reflux disease)    Hyperlipidemia    Hypertension    Overactive bladder    Psoriasis 2015    Past Surgical History:  Procedure Laterality Date   ABDOMINAL HYSTERECTOMY  2007   CHOLECYSTECTOMY  2000    Family Psychiatric History: ***  Family History:  Family History  Problem Relation Age of Onset   Diabetes Mother    Hypertension Mother    Depression Mother     Social History:   Social History    Socioeconomic History   Marital status: Widowed    Spouse name: Not on file   Number of children: Not on file   Years of education: Not on file   Highest education level: Associate degree: academic program  Occupational History   Not on file  Tobacco Use   Smoking status: Never   Smokeless tobacco: Never  Vaping Use   Vaping Use: Never used  Substance and Sexual Activity   Alcohol use: Yes    Comment:  Rarely. One or two drinks a year    Drug use: No   Sexual activity: Not on file  Other Topics Concern   Not on file  Social History Narrative   PT is not working right now. Pt worries about financial needs since her husband died in 2023/06/07 and she has no income. She applied for Widow's benefits in July but has not heard any information and the application is still pending. If possible please inquire about application status.     Social Determinants of Health   Financial Resource Strain: Not on file  Food Insecurity: Not on file  Transportation Needs: Not on file  Physical Activity: Not on file  Stress: Not on file  Social Connections: Not on file    Additional Social History: ***  Allergies:  No Known Allergies  Metabolic Disorder Labs: Lab Results  Component Value Date   HGBA1C 9.0 (H) 11/17/2018   No results found for: PROLACTIN Lab Results  Component Value Date   CHOL 131 06/01/2018   TRIG 123 06/01/2018   HDL 50 06/01/2018   CHOLHDL 2.6 06/01/2018  [place] 56 06/01/2018   LDLCALC 77 12/03/2017   Lab Results  Component Value Date   TSH 1.490 06/01/2018    Therapeutic Level Labs: No results found for: LITHIUM No results found for: CBMZ No results found for: VALPROATE  Current Medications: Current Outpatient Medications  Medication Sig Dispense Refill   amitriptyline (ELAVIL) 10 MG tablet TAKE 1 TABLET BY MOUTH ONCE DAILY AT BEDTIME. 30 tablet 2   aspirin 81 MG tablet Take 81 mg by mouth daily.     brexpiprazole (REXULTI) 2 MG TABS tablet Take 2 mg  by mouth.     brexpiprazole (REXULTI) 2 MG TABS tablet TAKE 1 TABLET BY MOUTH ONCE DAILY AT BEDTIME. 30 tablet 2   citalopram (CELEXA) 40 MG tablet Take 40 mg by mouth daily.      citalopram (CELEXA) 40 MG tablet TAKE 1 TABLET BY MOUTH ONCE DAILY EVERY MORNING. 30 tablet 2   Dulaglutide (TRULICITY) 1.5 YH/0.6CB SOPN Inject 1.5 mg into the skin once a week. Thursdays     fluconazole (DIFLUCAN) 150 MG tablet Take 1 tablet (150 mg total) by mouth daily. 1 tablet 0   insulin glargine (LANTUS) 100 UNIT/ML injection Inject 0.5 mLs (50 Units total) into the skin at bedtime. Inject 42 units once daily. (Patient taking differently: Inject 66 Units into the skin at bedtime.) 10 mL 11   Insulin Pen Needle 32G X 6 MM MISC 1 Syringe by Does not apply route. Use with Victoza.     lisinopril (ZESTRIL) 40 MG tablet TAKE ONE TABLET BY MOUTH EVERY DAY 30 tablet 0   metFORMIN (GLUCOPHAGE) 1000 MG tablet TAKE ONE TABLET BY MOUTH 2 TIMES A DAY WITH A MEAL. 180 tablet 0   mometasone (ELOCON) 0.1 % ointment Apply topically daily. Apply as needed for psoriasis (Patient not taking: Reported on 12/28/2020)     Multiple Vitamin (MULTIVITAMIN) tablet Take 1 tablet by mouth daily. (Patient not taking: Reported on 12/28/2020)     naproxen sodium (ALEVE) 220 MG tablet Take 220 mg by mouth 2 (two) times daily as needed (pain).     oxyCODONE (ROXICODONE) 5 MG immediate release tablet 1 every 6-8 hours as needed for pain. (Patient not taking: No sig reported) 15 tablet 0   pantoprazole (PROTONIX) 40 MG tablet TAKE ONE TABLET BY MOUTH EVERY DAY 90 tablet 0   simvastatin (ZOCOR) 20 MG tablet TAKE ONE TABLET BY MOUTH EVERY EVENING 90 tablet 0   tolterodine (DETROL LA) 2 MG 24 hr capsule Take 2 mg by mouth daily.     VICTOZA 18 MG/3ML SOPN Inject 1.2 mg once daily (Patient not taking: Reported on 12/28/2020) 36 mL 2   No current facility-administered medications for this visit.    Musculoskeletal: Strength & Muscle Tone:  N/A Gait &  Station:  N/A Patient leans: N/A  Psychiatric Specialty Exam: Review of Systems  Last menstrual period 10/15/2016.There is no height or weight on file to calculate BMI.  General Appearance: {Appearance:22683}  Eye Contact:  {BHH EYE CONTACT:22684}  Speech:  Clear and Coherent  Volume:  Normal  Mood:  {BHH MOOD:22306}  Affect:  {Affect (PAA):22687}  Thought Process:  Coherent  Orientation:  Full (Time, Place, and Person)  Thought Content:  Logical  Suicidal Thoughts:  {ST/HT (PAA):22692}  Homicidal Thoughts:  {ST/HT (PAA):22692}  Memory:  Immediate;   Good  Judgement:  {Judgement (PAA):22694}  Insight:  {Insight (PAA):22695}  Psychomotor Activity:  Normal  Concentration:  Concentration: Good and Attention Span: Good  Recall:  Roel Cluck of Knowledge:Good  Language: Good  Akathisia:  No  Handed:  Right  AIMS (if indicated):  not done  Assets:  Communication Skills Desire for Improvement  ADL's:  Intact  Cognition: WNL  Sleep:  {BHH GOOD/FAIR/POOR:22877}   Screenings: Flowsheet Row ED from 11/12/2021 in [place] [facility] from 07/20/2021 in [place] [facility] from 05/24/2021 in [place] No Risk No Risk No Risk       Assessment and Plan:  Assessment  Plan   The patient demonstrates the following risk factors for suicide: Chronic risk factors for suicide include: {Chronic Risk Factors for WUJWJXB:14782956}. Acute risk factors for suicide include: {Acute Risk Factors for OZHYQMV:78469629}. Protective factors for this patient include: {Protective Factors for Suicide BMWU:13244010}. Considering these factors, the overall suicide risk at this point appears to be {Desc; low/moderate/high:110033}. Patient {ACTION; IS/IS UVO:53664403} appropriate for outpatient follow up.   Collaboration of Care: {BH OP Collaboration of  Care:21014065}  Patient/Guardian was advised Release of Information must be obtained prior to any record release in order to collaborate their care with an outside provider. Patient/Guardian was advised if they have not already done so to contact the registration department to sign all necessary forms in order for [place] to release information regarding their care.   Consent: Patient/Guardian gives verbal consent for treatment and assignment of benefits for services provided during this visit. Patient/Guardian expressed understanding and agreed to proceed.   Norman Clay, MD 5/30/20235:02 PM

## 2022-04-30 ENCOUNTER — Ambulatory Visit: Payer: Medicaid Other | Admitting: Psychiatry

## 2022-04-30 ENCOUNTER — Telehealth: Payer: Self-pay | Admitting: Psychiatry

## 2022-04-30 NOTE — Telephone Encounter (Signed)
Although she tries to log in for video visit, there has been technical difficulties (she log in and out). Not able to talk with her despite trying it for 30 mins. Left voice message to contact the office to reschedule for in person visit.
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
## 2022-05-01 ENCOUNTER — Ambulatory Visit (INDEPENDENT_AMBULATORY_CARE_PROVIDER_SITE_OTHER): Payer: Medicaid Other | Admitting: Psychiatry

## 2022-05-01 ENCOUNTER — Encounter: Payer: Self-pay | Admitting: Psychiatry

## 2022-05-01 VITALS — BP 120/74 | HR 94 | Temp 98.1°F | Wt 229.8 lb

## 2022-05-01 DIAGNOSIS — F331 Major depressive disorder, recurrent, moderate: Secondary | ICD-10-CM

## 2022-05-01 DIAGNOSIS — F4010 Social phobia, unspecified: Secondary | ICD-10-CM

## 2022-05-01 DIAGNOSIS — G4700 Insomnia, unspecified: Secondary | ICD-10-CM | POA: Diagnosis not present

## 2022-05-01 MED ORDER — CLONAZEPAM 0.5 MG PO TABS: 0.5000 mg | ORAL_TABLET | Freq: Every day | ORAL | 2 refills | Status: DC | PRN

## 2022-05-01 MED ORDER — BUPROPION HCL ER (XL) 150 MG PO TB24: 150.0000 mg | ORAL_TABLET | Freq: Every day | ORAL | 1 refills | Status: DC

## 2022-05-01 MED ORDER — CITALOPRAM HYDROBROMIDE 40 MG PO TABS: 40.0000 mg | ORAL_TABLET | Freq: Every morning | ORAL | 2 refills | Status: DC

## 2022-05-01 NOTE — Unmapped (Signed)
The referral says urgent, phone call required .  Did Harriett Sine or Gene call for this?

## 2022-05-01 NOTE — Patient Instructions (Signed)
Start bupropion 150 mg daily Continue citalopram 40 mg daily Discontinue amitriptyline Continue clonazepam 0.5 mg daily as needed for anxiety  Referral for evaluation of sleep apnea Next appointment: 7/6 at 3:30, in person

## 2022-05-02 ENCOUNTER — Ambulatory Visit: Admit: 2022-05-02 | Discharge: 2022-05-03 | Payer: PRIVATE HEALTH INSURANCE | Attending: Family | Primary: Family

## 2022-05-02 DIAGNOSIS — M25512 Pain in left shoulder: Principal | ICD-10-CM

## 2022-05-02 MED ORDER — CYCLOBENZAPRINE 5 MG TABLET
ORAL_TABLET | 1 refills | 0 days | Status: CP
Start: 2022-05-02 — End: ?
  Filled 2022-05-05: qty 30, 10d supply, fill #0

## 2022-05-02 MED ADMIN — triamcinolone acetonide (KENALOG) injection 20 mg: 20 mg | INTRA_ARTICULAR | @ 15:00:00 | Stop: 2022-05-02

## 2022-05-02 NOTE — Unmapped (Signed)
Patient states that she got into psychiatry at [facility] and that she has an appt tomorrow with them   she also stated that the Rexulti went through also and that she has been taking it.

## 2022-05-02 NOTE — Unmapped (Signed)
Plan:You received a corticosteroid injection to reduce pain and inflammation.  Please note that it can take up to 2 weeks for this injection to fully work.  While many people will feel relief sooner, please be patient.      The injection contained a corticosteroid and a numbing agent.  The numbing agent can last for 1-6 hours.  After this wears off you may have increased pain until the steroid has a chance to work.    What are some of the possible side effects of a steroid injection?    Common side effects:  temporarily elevated blood sugar (in diabetic patients) that can last a few days   flushing of the skin, especially the face  temporary rise in blood pressure  discoloration or atrophy of the skin at the injection site    Call your doctor at once if you have:  persistent worsening pain or swelling, fever;  blurred vision, tunnel vision, eye pain, or seeing halos around lights;  fast or slow heartbeats;  increased blood pressure that is associated with severe headache, blurred vision, pounding in your neck or ears, anxiety, nosebleed;  headaches, ringing in your ears, dizziness, nausea, vision problems, pain behind your eyes    This is not a complete list of side effects and others may occur. Call your provider for medical advice about side effects.     What other drugs may be affected after the injection?  Many drugs can interact with steroids. Not all possible interactions are listed here. Tell your doctor about all your current medicines and any you start or stop using, especially:  an antibiotic or antifungal medication;  birth control pills or hormone replacement therapy;  a blood thinner (warfarin, Coumadin, and others);  a diuretic or water pill;  insulin or oral diabetes medicine;  medicine to treat tuberculosis;  a nonsteroidal anti-inflammatory drug or NSAID (aspirin, ibuprofen, naproxen, diclofenac, indomethacin, Advil, Aleve, Celebrex, and many others); or  seizure medication.      You can resume your normal daily activities, but consider resting the injected area for the next few days.          Thank you for coming to [facility] and our clinic today!     We aim to provide you with the highest quality, individualized care.  If you have any unanswered questions after the visit, please do not hesitate to reach out to us on MyChart or leave a message for the nurse.  ?  MyChart messages: These messages can be sent to your provider and will be checked by their clinical support staff.? The messages are checked throughout the day during normal business hours from 8:30 am-4:00 pm Monday-Friday, however responses may take up to 48 hours.? Please use this method of communication for non-urgent and non-emergent concerns, questions, refill requests or inquiries only.? ?Our team will help respond to all of your questions.? Please note that you may be asked to see a provider by either a telehealth or in person visit if it is deemed your questions are best handled in the clinic setting in person.??  ?  Please keep in mind, these messages are not real time communications, so be patient when waiting for a response.    If you do not have access to MyChart, do not know how to use MyChart or have an issue that may require more extensive discussion, please call the nurses' call line: [phone].? This line is checked throughout the day and will be   responded to as time allows.? Please note that return calls could take up to 48 hours, depending on the nature of the need.?  ?  If you have an issue that requires emergent attention that cannot wait; either call the Orthopaedics resident on call at [phone], consider coming to our [facility] walk-in clinic, or go to the nearest Emergency Department.    If you need to schedule future appointments, please call [phone].     We look forward to seeing you again in the future and appreciate you choosing [facility] for your care!    Thank you,                We provide innovative and comprehensive patient centered care that is supported by evidence-based research                                                                                                    RESEARCH PARTICIPATION    Please check out our current research studies to see if you or someone you know may qualify at:    [email or web address]

## 2022-05-02 NOTE — Unmapped (Signed)
SPORTS MEDICINE RETURN VISIT    ASSESSMENT AND PLAN      Diagnosis ICD-10-CM Associated Orders   1. Left shoulder pain, unspecified chronicity  M25.512 Lg Joint Inj: L subacromial bursa           I offered her subacromial steroid injection today to help with her shoulder pain.  Nothing on examination explains paresthesia.  I would like to see her back in 2 to 3 weeks to ensure that her symptoms improve/resolve.    Return for 2 to 3 weeks.    Procedure(s):  See note below      SUBJECTIVE     Chief Complaint:   Chief Complaint   Patient presents with   ??? Left Shoulder - Pain, Follow-up     Pain began 1 month ago, gradually getting worse. States no real incident to pain. Concern with numbing that starts in arm and goes  to hands, having dropped items when reaching/ carrying. Right handed       History of Present Illness: 56 y.o. RHD female who presents for L shoulder pain began last month, worsening. Began having numbness in last 2 digits on L hand, now entire hand numb. Nothing helps.     Past Medical History:   Past Medical History:   Diagnosis Date   ??? Anxiety    ??? Blepharitis    ??? Cancer (CMS-HCC)     endometrial 2007   ??? Diabetes mellitus (CMS-HCC)    ??? Diabetic retinopathy (CMS-HCC)     Mild NPDR w/ME, left eye   ??? Endometrial cancer (CMS-HCC) 2007   ??? Gastroparesis    ??? Heart disease    ??? High cholesterol    ??? Hypertension    ??? Liver disease    ??? Non-alcoholic fatty liver disease    ??? 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 Psoriasis    ??? Ptosis of eyelid, left     Left upper eyelid   ??? Reflux          OBJECTIVE     Physical Exam:  Vitals:   Wt Readings from Last 3 Encounters:   03/12/22 (!) 104.2 kg (229 lb 12.8 oz)   03/06/22 (!) 107 kg (236 lb)   02/11/22 (!) 107.5 kg (237 lb)     Estimated body mass index is 39.45 kg/m?? as calculated from the following:    Height as of 03/06/22: 162.6 cm (5' 4).    Weight as of 03/12/22: 104.2 kg (229 lb 12.8 oz).  Gen: Well-appearing female in no acute distress  MSK: No tenderness to palpation about left shoulder.  Forward elevation to 100 degrees with pain.  External rotation arms at sides to 60 degrees with pain.  Internal rotation back pocket with pain.  Rotator cuff strength is 4/5 throughout with pain in supraspinatus testing.  Positive Hawkins.  Positive Neer's.  Negative speeds.  Negative Yergason's.  Negative Tinel's at the elbow and wrist.  Notes altered sensation all digits on the left hand.  Radial pulse +2.  Capillary fill is brisk.    Imaging/other tests: No new x-rays taken today.    @SPORTSPROMIS @      ADMINISTRATIVE     I have personally reviewed and interpreted the images (as available).  Point-of-care ultrasound imaging is on file and stored in a permanent location (if performed).  I have personally reviewed prior records and incorporated relevant information above (as available).    @SMIBILLING @  PROCEDURES     Lg Joint Inj: L subacromial bursa on 05/02/2022 11:20 AM  Indications: pain  Details: 22 G needle, posterior approach  Laterality: left  Location: shoulder  Medications: 20 mg triamcinolone acetonide 10 mg/mL  Outcome: tolerated well, no immediate complications  Procedure, treatment alternatives, risks and benefits explained, specific risks discussed. Consent was given by the patient. Immediately prior to procedure a time out was called to verify the correct patient, procedure, equipment, support staff and site/side marked as required. Patient was prepped and draped in the usual sterile fashion.     Medical Care Team Attestation: All ProcDoc orders were read back and verbally confirmed with the procedure provider, including but not limited to patient name, medication name, dose, and route, before any actions were taken.  Self Attestation: I, the provider, prepared the medications and administered for this procedure as documented above.  Provider Attestation: The information documented by members of my medical care team was reviewed and verified for accuracy by me.             DME     DME ORDER:  Dx:  ,

## 2022-05-05 ENCOUNTER — Encounter
Admit: 2022-05-05 | Discharge: 2022-05-05 | Payer: PRIVATE HEALTH INSURANCE | Attending: Nurse Practitioner | Primary: Nurse Practitioner

## 2022-05-05 ENCOUNTER — Ambulatory Visit: Admit: 2022-05-05 | Discharge: 2022-05-05 | Payer: PRIVATE HEALTH INSURANCE

## 2022-05-05 MED ORDER — CARVEDILOL 6.25 MG TABLET
ORAL_TABLET | Freq: Two times a day (BID) | ORAL | 3 refills | 90 days | Status: CP
Start: 2022-05-05 — End: 2023-06-05

## 2022-05-05 MED ADMIN — fentaNYL (PF) (SUBLIMAZE) injection: INTRAVENOUS | @ 12:00:00 | Stop: 2022-05-05

## 2022-05-05 MED ADMIN — propofol (DIPRIVAN) infusion 10 mg/mL: INTRAVENOUS | @ 12:00:00 | Stop: 2022-05-05

## 2022-05-05 MED ADMIN — ondansetron (ZOFRAN) injection: INTRAVENOUS | @ 12:00:00 | Stop: 2022-05-05

## 2022-05-05 MED ADMIN — lactated Ringers infusion: 10 mL/h | INTRAVENOUS | @ 12:00:00 | Stop: 2022-05-05

## 2022-05-05 MED ADMIN — propofoL (DIPRIVAN) injection: INTRAVENOUS | @ 12:00:00 | Stop: 2022-05-05

## 2022-05-05 MED ADMIN — lidocaine (XYLOCAINE) 20 mg/mL (2 %) injection: INTRAVENOUS | @ 12:00:00 | Stop: 2022-05-05

## 2022-05-05 MED ADMIN — phenylephrine 0.8 mg/10 mL (80 mcg/mL) injection: INTRAVENOUS | @ 12:00:00 | Stop: 2022-05-05

## 2022-05-05 MED ADMIN — succinylcholine chloride (ANECTINE) injection: INTRAVENOUS | @ 12:00:00 | Stop: 2022-05-05

## 2022-05-05 MED FILL — CELECOXIB 200 MG CAPSULE: ORAL | 30 days supply | Qty: 60 | Fill #1

## 2022-05-05 MED FILL — TRULICITY 1.5 MG/0.5 ML SUBCUTANEOUS PEN INJECTOR: SUBCUTANEOUS | 28 days supply | Qty: 4 | Fill #2

## 2022-05-08 DIAGNOSIS — E669 Obesity, unspecified: Principal | ICD-10-CM

## 2022-05-08 MED ORDER — CARVEDILOL 6.25 MG TABLET
ORAL_TABLET | Freq: Two times a day (BID) | ORAL | 3 refills | 90 days | Status: CP
Start: 2022-05-08 — End: 2023-06-08

## 2022-05-09 MED ORDER — PROPRANOLOL 20 MG TABLET
ORAL_TABLET | Freq: Two times a day (BID) | ORAL | 3 refills | 90 days | Status: CP
Start: 2022-05-09 — End: 2023-05-09

## 2022-05-15 DIAGNOSIS — Z794 Long term (current) use of insulin: Principal | ICD-10-CM

## 2022-05-15 DIAGNOSIS — E1169 Type 2 diabetes mellitus with other specified complication: Principal | ICD-10-CM

## 2022-05-15 MED ORDER — METFORMIN ER 500 MG TABLET,EXTENDED RELEASE 24 HR
ORAL_TABLET | Freq: Two times a day (BID) | ORAL | 1 refills | 90 days | Status: CP
Start: 2022-05-15 — End: 2023-05-15
  Filled 2022-05-19: qty 360, 90d supply, fill #0

## 2022-05-15 NOTE — Unmapped (Signed)
Patient is requesting the following refill  Requested Prescriptions     Pending Prescriptions Disp Refills    metFORMIN (GLUCOPHAGE-XR) 500 MG 24 hr tablet 360 tablet 1     Sig: Take 2 tablets (1,000 mg total) by mouth in the morning and 2 tablets (1,000 mg total) in the evening. Take with meals.       Recent Visits  Date Type Provider Dept   03/06/22 Office Visit Desmond Dike, NP [facility] [address] At [facility]   02/11/22 Office Visit Desmond Dike, NP [place] [facility]   01/13/22 Office Visit Desmond Dike, NP [place] [facility]   09/12/21 Office Visit Desmond Dike, NP [place] [facility]   05/17/21 Office Visit Desmond Dike, NP [place] [facility]   Showing recent visits within past 365 days with a meds authorizing provider and meeting all other requirements  Future Appointments  Date Type Provider Dept   06/02/22 Appointment Desmond Dike, NP [place] [facility] [address] [facility]   Showing future appointments within next 365 days with a meds authorizing provider and meeting all other requirements       Labs: A1c:   Hemoglobin A1C (%)   Date Value   01/13/2022 6.4 (A)

## 2022-05-16 ENCOUNTER — Ambulatory Visit: Admit: 2022-05-16 | Discharge: 2022-05-17 | Payer: PRIVATE HEALTH INSURANCE | Attending: Family | Primary: Family

## 2022-05-16 ENCOUNTER — Ambulatory Visit: Admit: 2022-05-16 | Discharge: 2022-05-17 | Payer: PRIVATE HEALTH INSURANCE

## 2022-05-16 NOTE — Unmapped (Signed)
SPORTS MEDICINE RETURN VISIT    ASSESSMENT AND PLAN      Diagnosis ICD-10-CM Associated Orders   1. Adhesive capsulitis of left shoulder  M75.02       2. Left shoulder pain, unspecified chronicity  M25.512 XR Shoulder 3 Or More Views Left           Very limited external rotation fits with frozen shoulder.  I think her numbness and tingling is likely related to ulnar nerve irritation because of guarding the left upper extremity.  I would like her to begin formal physical therapy after large-volume glenohumeral injection with nonoperative sports doctor.    Return for procedure with non-Sports MD L large volume GH injection.    Procedure(s):  None      SUBJECTIVE     Chief Complaint:   Chief Complaint   Patient presents with   ??? Left Shoulder - Pain, Follow-up     Patients reports that she's still having pain in the shoulder - numbness that radiates down arm into hand       History of Present Illness: 56 y.o. female who presents for follow up of L shoulder pain after subacromial steroid injection. Improvement in shoulder pain for 1 week, then pain returned in the anterior and lateral shoulder. Numbness in B hands has not improvement and did not change with injection.     Past Medical History:   Past Medical History:   Diagnosis Date   ??? Anxiety    ??? Blepharitis    ??? Cancer (CMS-HCC)     endometrial 2007   ??? Diabetes mellitus (CMS-HCC)    ??? Diabetic retinopathy (CMS-HCC)     Mild NPDR w/ME, left eye   ??? Endometrial cancer (CMS-HCC) 2007   ??? Gastroparesis    ??? Heart disease    ??? High cholesterol    ??? Hypertension    ??? Liver disease    ??? Non-alcoholic fatty liver disease    ??? 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 Psoriasis    ??? Ptosis of eyelid, left     Left upper eyelid   ??? Reflux          OBJECTIVE     Physical Exam:  Vitals:   Wt Readings from Last 3 Encounters:   05/05/22 100.7 kg (222 lb)   03/12/22 (!) 104.2 kg (229 lb 12.8 oz)   03/06/22 (!) 107 kg (236 lb)     Estimated body mass index is 37.52 kg/m?? as calculated from the following:    Height as of 05/05/22: 163.8 cm (5' 4.5).    Weight as of 05/05/22: 100.7 kg (222 lb).  Gen: Well-appearing female in no acute distress  MSK: No obvious deformity left shoulder.  Tenderness palpation about anterior glenohumeral joint.  External rotation with arms at sides less than 10 degrees.  Internal rotation to iliac crest.  Forward elevation to approximately 75 degrees.  Positive Tinel's at the elbow.  Negative Tinel's at the wrist.  Positive okay, EPL and interosseous.  Radial pulse +2.  Capillary refills brisk.    Imaging/other tests: Left shoulder x-rays taken today and reviewed with patient in exam room show:  Diffuse osteopenia.   ??   No acute fractures. Chronic deformity of the proximal humerus. No malalignment.. Mild acromioclavicular joint osseous overgrowth. Glenohumeral joint space is normally aligned with small osteophytes. Partially imaged lungs are clear.          @SPORTSPROMIS @  ADMINISTRATIVE     I have personally reviewed and interpreted the images (as available).  Point-of-care ultrasound imaging is on file and stored in a permanent location (if performed).  I have personally reviewed prior records and incorporated relevant information above (as available).    @SMIBILLING @    PROCEDURES     Procedures     DME     DME ORDER:  Dx:  ,

## 2022-05-16 NOTE — Unmapped (Signed)
Plan:Work on exercises as able.       Thank you for coming to [facility] and our clinic today!     We aim to provide you with the highest quality, individualized care.  If you have any unanswered questions after the visit, please do not hesitate to reach out to [place] on MyChart or leave a message for the nurse.  ?  MyChart messages: These messages can be sent to your provider and will be checked by their clinical support staff.? The messages are checked throughout the day during normal business hours from 8:30 am-4:00 pm Monday-Friday, however responses may take up to 48 hours.? Please use this method of communication for non-urgent and non-emergent concerns, questions, refill requests or inquiries only.? ?Our team will help respond to all of your questions.? Please note that you may be asked to see a provider by either a telehealth or in person visit if it is deemed your questions are best handled in the clinic setting in person.??  ?  Please keep in mind, these messages are not real time communications, so be patient when waiting for a response.    If you do not have access to MyChart, do not know how to use MyChart or have an issue that may require more extensive discussion, please call the nurses' call line: [phone].? This line is checked throughout the day and will be responded to as time allows.? Please note that return calls could take up to 48 hours, depending on the nature of the need.?  ?  If you have an issue that requires emergent attention that cannot wait; either call the Orthopaedics resident on call at [phone], consider coming to our [facility] walk-in clinic, or go to the nearest Emergency Department.    If you need to schedule future appointments, please call [phone].     We look forward to seeing you again in the future and appreciate you choosing [facility] for your care!    Thank you,                We provide innovative and comprehensive patient centered care that is supported by evidence-based research                                                                                                    RESEARCH PARTICIPATION    Please check out our current research studies to see if you or someone you know may qualify at:    [email or web address]

## 2022-05-19 MED ORDER — PROMETHAZINE 12.5 MG TABLET
ORAL_TABLET | Freq: Three times a day (TID) | ORAL | 0 refills | 17 days | Status: CP | PRN
Start: 2022-05-19 — End: 2022-06-18
  Filled 2022-05-23: qty 50, 17d supply, fill #0

## 2022-05-19 MED FILL — INSULIN GLARGINE (U-100) 100 UNIT/ML SUBCUTANEOUS SOLUTION: SUBCUTANEOUS | 30 days supply | Qty: 20 | Fill #5

## 2022-05-26 MED FILL — CYCLOBENZAPRINE 5 MG TABLET: 5 days supply | Qty: 30 | Fill #1

## 2022-05-31 NOTE — Unmapped (Unsigned)
[facility] Note:  [place], [place] [address]. Phone [phone]    05/30/2022    Patient Name:   Ashley Briggs    MRN: [id number]    Demographics:    Age-  56 y.o.     Date of Birth-  [birth year]    Chief complaint (CC):  No chief complaint on file.      Assessment/Plan:    Ashley Briggs is a pleasant 56 y.o. female with Hx of anxiety and depression, here for follow up.     {No diagnosis found. (Refresh or delete this SmartLink)}    30 minutes of clinical time > 1/2 the office visit was face to face time. We discussed medical, dietary, lifestyle, and health maintenance modifications to optimize health. Standard precautions followed during visit. Medication adherence and barriers to the treatment plan have been addressed.  Patient voiced understanding, needs F/U visit as scheduled.    Health Maintenance:   Health Maintenance Due   Topic Date Due    Zoster Vaccines (1 of 2) Never done    COVID-19 Vaccine (3 - Moderna risk series) 07/26/2020    Pneumococcal Vaccine 0-64 (2 - PCV) 11/09/2020    Retinal Eye Exam  06/17/2022         PHQ-2 Score:      PHQ-9 Score:      Inocente Salles Score:      {select_status_or_delete_smartlist:64641}    Subjective:    History of present illness (HPI):       Ashley Briggs is a 56 y.o. female who presented to [facility] for F/U mood. Patient's medical history was reviewed, see Past Medical History. Medications used in the past were reviewed, see medications. Patient reports eating and eliminating well. Pt is sleeping well. Patient {has/not:18111::has not} required emergency room treatment for these symptoms, and {has/not:18111::has not} required hospitalization.     Denies HA, fever, chest pain, shortness of breath, N/V/D, bowel or bladder issues, vision or hearing changes, or swelling.     Relevant ROS: Reviewed 12 systems, positive findings listed, all others negative.    Pertinent Past Med Hx:    Past Medical History:   Diagnosis Date    Anxiety     Blepharitis     Cancer (CMS-HCC)     endometrial 2007    Diabetes mellitus (CMS-HCC)     Diabetic retinopathy (CMS-HCC)     Mild NPDR w/ME, left eye    Endometrial cancer (CMS-HCC) 2007    Gastroparesis     Heart disease     High cholesterol     Hypertension     Liver disease     Non-alcoholic fatty liver disease     NPDR (nonproliferative diabetic retinopathy) (CMS-HCC)     Right eye    Psoriasis     Ptosis of eyelid, left     Left upper eyelid    Reflux        Medications:       Current Outpatient Medications:     amitriptyline (ELAVIL) 10 MG tablet, TAKE ONE TABLET BY MOUTH ONCE DAILY AT BEDTIME., Disp: , Rfl:     aspirin (ECOTRIN) 81 MG tablet, Take 1 tablet (81 mg total) by mouth daily., Disp: 90 tablet, Rfl: 3    blood-glucose meter Misc, Use as directed by provider., Disp: 1 each, Rfl: 0    canagliflozin (INVOKANA) 300 mg Tab tablet, Take 1 tablet (300 mg total) by mouth every morning before breakfast., Disp: 90 tablet, Rfl: 0  celecoxib (CELEBREX) 200 MG capsule, Take 1 capsule (200 mg total) by mouth two (2) times a day as needed for pain., Disp: 180 capsule, Rfl: 1    citalopram (CELEXA) 40 MG tablet, Take 1 tablet (40 mg total) by mouth daily., Disp: , Rfl:     clonazePAM (KLONOPIN) 0.5 MG tablet, Take 1 tablet (0.5 mg total) by mouth nightly as needed for anxiety and sleep. May repeat dose once., Disp: 40 tablet, Rfl: 1    cyclobenzaprine (FLEXERIL) 5 MG tablet, Take 1-2 tablets by mouth every 8 hours as needed muscle pain or spasm., Disp: 30 tablet, Rfl: 1    dulaglutide (TRULICITY) 1.5 mg/0.5 mL PnIj, Inject the contents of 2 syringes (3 mg total) under the skin every seven (7) days., Disp: 4 mL, Rfl: 6    ferrous sulfate (SLOW FE) 142 mg (45 mg iron) TbER, Take 1 tablet (142 mg) by mouth two (2) times a day., Disp: 60 tablet, Rfl: 3    insulin glargine (LANTUS U-100 INSULIN) 100 unit/mL injection, Inject 0.66 mL (66 Units total) under the skin nightly., Disp: 60 mL, Rfl: 1    lancets 30 gauge Misc, Use as directed to test once daily., Disp: 100 each, Rfl: 3    lisinopriL (PRINIVIL,ZESTRIL) 40 MG tablet, Take 1 tablet (40 mg total) by mouth in the morning., Disp: 90 tablet, Rfl: 1    metFORMIN (GLUCOPHAGE-XR) 500 MG 24 hr tablet, Take 2 tablets (1,000 mg total) by mouth in the morning and 2 tablets (1,000 mg total) in the evening. Take with meals., Disp: 360 tablet, Rfl: 1    pantoprazole (PROTONIX) 40 MG tablet, Take 1 tablet (40 mg total) by mouth Two (2) times a day., Disp: 180 tablet, Rfl: 1    promethazine (PHENERGAN) 12.5 MG tablet, Take 1 tablet (12.5 mg total) by mouth every eight (8) hours as needed for nausea., Disp: 50 tablet, Rfl: 0    propranoloL (INDERAL) 20 MG tablet, Take 1 tablet (20 mg total) by mouth Two (2) times a day., Disp: 180 tablet, Rfl: 3    simvastatin (ZOCOR) 20 MG tablet, Take 1 tablet (20 mg total) by mouth every evening., Disp: 90 tablet, Rfl: 1    tolterodine (DETROL LA) 4 MG 24 hr capsule, Take 1 capsule (4 mg total) by mouth daily., Disp: 90 capsule, Rfl: 2     Allergies:   No Known Allergies    Pertinent Social Hx and Habits: EMR reviewed    Pertinent Family Hx: EMR reviewed    Objective:      BP Readings from Last 3 Encounters:   05/05/22 127/66   03/12/22 100/54   03/06/22 100/50        There were no vitals filed for this visit.     Physical Exam:    General Appearance: WDWN in NAD      Skin: W, D, I  HEENT: PERRLA, EOMI, TM's clear  Respiratory: Clear throughout  Cardio: RRR  Abdomen: Soft and non-tnder  Neurologic: A & O X 4, Grossly intact, stable gait  PSYCH: Behavior calm and cooperative    Diagnostics:     Lab Results   Component Value Date    WBC 3.7 03/06/2022    HGB 11.2 (L) 03/06/2022    HCT 34.1 03/06/2022    PLT 118 (L) 03/06/2022       Lab Results   Component Value Date    NA 143 03/06/2022    K 3.9 03/06/2022  CL 108 (H) 03/06/2022    CO2 22.0 03/06/2022    BUN 13 03/06/2022    CREATININE 0.58 (L) 03/06/2022    GLU 138 03/06/2022    CALCIUM 9.3 03/06/2022       Lab Results Component Value Date    BILITOT 1.3 (H) 03/06/2022    PROT 7.7 03/06/2022    ALBUMIN 3.0 (L) 03/06/2022    ALT 37 03/06/2022    AST 95 (H) 03/06/2022    ALKPHOS 168 (H) 03/06/2022       Lab Results   Component Value Date    PT 12.2 01/31/2011    INR 1.1 01/31/2011    APTT 30.7 01/31/2011        No results found for: LIPID, TSH, A1CPCT     I attest that I, Benita Stabile, personally documented this note while acting as scribe for Olena Leatherwood, NP.      Benita Stabile, Scribe.  06/02/2022     The documentation recorded by the scribe accurately reflects the service I personally performed and the decisions made by me.     Olena Leatherwood, NP    Dr. Betha Loa, DNP, FNP-BC  [facility] at [facility] Certified Doctor of Nursing Practice   [phone]

## 2022-06-01 DIAGNOSIS — E113212 Type 2 diabetes mellitus with mild nonproliferative diabetic retinopathy with macular edema, left eye: Principal | ICD-10-CM

## 2022-06-01 DIAGNOSIS — Z794 Long term (current) use of insulin: Principal | ICD-10-CM

## 2022-06-01 MED ORDER — CANAGLIFLOZIN 300 MG TABLET
ORAL_TABLET | Freq: Every day | ORAL | 0 refills | 90 days
Start: 2022-06-01 — End: ?

## 2022-06-02 MED ORDER — CANAGLIFLOZIN 300 MG TABLET
ORAL_TABLET | Freq: Every day | ORAL | 0 refills | 90 days | Status: CP
Start: 2022-06-02 — End: ?
  Filled 2022-06-04: qty 90, 90d supply, fill #0

## 2022-06-02 NOTE — Unmapped (Signed)
Patient is requesting the following refill  Requested Prescriptions     Signed Prescriptions Disp Refills    canagliflozin (INVOKANA) 300 mg Tab tablet 90 tablet 0     Sig: Take 1 tablet (300 mg total) by mouth every morning before breakfast.     Authorizing Provider: Jacqualine Code     Ordering User: Cathlean Cower       Recent Visits  Date Type Provider Dept   03/12/22 Office Visit Jacqualine Code, MD [place] [facility]   03/06/22 Office Visit Desmond Dike, NP [place] [facility] [address] [facility]   02/11/22 Office Visit Desmond Dike, NP [place] [facility]   01/13/22 Office Visit Desmond Dike, NP [place] [facility]   09/12/21 Office Visit Desmond Dike, NP [place] [facility]   Showing recent visits within past 365 days with a meds authorizing provider and meeting all other requirements  Future Appointments  Date Type Provider Dept   09/11/22 Appointment Jacqualine Code, MD [place] [facility]   Showing future appointments within next 365 days with a meds authorizing provider and meeting all other requirements       Labs: A1c:   Hemoglobin A1C (%)   Date Value   01/13/2022 6.4 (A)    Creatinine:   Creatinine   Date Value   03/06/2022 0.58 mg/dL (L)   16/09/9603 5.40 MG/DL
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
## 2022-06-04 MED FILL — TRULICITY 1.5 MG/0.5 ML SUBCUTANEOUS PEN INJECTOR: SUBCUTANEOUS | 28 days supply | Qty: 4 | Fill #3

## 2022-06-05 ENCOUNTER — Ambulatory Visit: Payer: Medicaid Other | Admitting: Psychiatry

## 2022-06-16 ENCOUNTER — Ambulatory Visit: Admit: 2022-06-16 | Discharge: 2022-06-17 | Payer: PRIVATE HEALTH INSURANCE

## 2022-06-16 DIAGNOSIS — K7682 Hepatic encephalopathy (CMS-HCC): Principal | ICD-10-CM

## 2022-06-16 DIAGNOSIS — Z23 Encounter for immunization: Principal | ICD-10-CM

## 2022-06-16 DIAGNOSIS — K7460 Unspecified cirrhosis of liver: Principal | ICD-10-CM

## 2022-06-16 DIAGNOSIS — Z79899 Other long term (current) drug therapy: Principal | ICD-10-CM

## 2022-06-16 DIAGNOSIS — I8510 Secondary esophageal varices without bleeding: Principal | ICD-10-CM

## 2022-06-16 LAB — HM HEPATITIS C SCREENING LAB: HM Hepatitis Screen: NEGATIVE

## 2022-06-16 LAB — BASIC METABOLIC PANEL
ANION GAP: 11 mmol/L (ref 5–14)
BLOOD UREA NITROGEN: 14 mg/dL (ref 9–23)
BUN / CREAT RATIO: 27
CALCIUM: 9.5 mg/dL (ref 8.7–10.4)
CHLORIDE: 108 mmol/L — ABNORMAL HIGH (ref 98–107)
CO2: 22 mmol/L (ref 20.0–31.0)
CREATININE: 0.51 mg/dL — ABNORMAL LOW
EGFR CKD-EPI (2021) FEMALE: >90 mL/min/{1.73_m2} (ref >=60)
GLUCOSE RANDOM: 276 mg/dL — ABNORMAL HIGH (ref 70–179)
POTASSIUM: 4.3 mmol/L (ref 3.5–5.1)
SODIUM: 141 mmol/L (ref 135–145)

## 2022-06-16 LAB — HEPATIC FUNCTION PANEL
ALBUMIN: 2.8 g/dL — ABNORMAL LOW (ref 3.4–5.0)
ALKALINE PHOSPHATASE: 220 U/L — ABNORMAL HIGH (ref 46–116)
ALT (SGPT): 34 U/L (ref 10–49)
AST (SGOT): 56 U/L — ABNORMAL HIGH (ref <=34)
BILIRUBIN DIRECT: 0.6 mg/dL — ABNORMAL HIGH (ref 0.00–0.30)
BILIRUBIN TOTAL: 1.1 mg/dL (ref 0.3–1.2)
PROTEIN TOTAL: 7.2 g/dL (ref 5.7–8.2)

## 2022-06-16 LAB — AFP TUMOR MARKER: AFP-TUMOR MARKER: <2 ng/mL (ref <=8)

## 2022-06-16 LAB — PROTIME-INR
INR: 1.16
PROTIME: 13.2 s — ABNORMAL HIGH (ref 9.8–12.8)

## 2022-06-16 MED ORDER — LACTULOSE 10 GRAM/15 ML ORAL SOLUTION
Freq: Three times a day (TID) | ORAL | 0 refills | 3 days | Status: CP
Start: 2022-06-16 — End: 2023-06-16
  Filled 2022-06-20: qty 240, 3d supply, fill #0

## 2022-06-16 MED ORDER — CARVEDILOL 6.25 MG TABLET
ORAL_TABLET | Freq: Two times a day (BID) | ORAL | 3 refills | 90 days | Status: CP
Start: 2022-06-16 — End: 2023-06-16
  Filled 2022-06-20: qty 180, 90d supply, fill #0

## 2022-06-16 NOTE — Unmapped (Addendum)
--   Please go to the lab on the ground floor by check-in for blood work today. Dr Fix's nurse will update you with results and next steps  -- We will schedule you for a follow up in 6 months  -- You have cirrhosis of the liver complicated by esophageal varices. This is likely due to fatty liver disease over many years.  -- Hepatocellular carcinoma screening: due to cirrhosis you have increased risk of liver cancer, which is very treatable if caught early. We need to do screening imaging every 6 months. We will start with a MRI of the liver now  -- Varices: you have enlarged veins in the esophagus that can bleed. To prevent bleeding, we are going to start you on a medication called carvedilol to reduce the blood pressure in the veins. This is a blood pressure medicine so we will stop your lisinopril and propranolol. And you will take just carvedilol.  -- Stop proproanolol  -- Stop lisinopril  -- Start Carvedilol 6.25mg  (1 tablet) nightly. If you tolerate this well, increase to 1 tablet twice daily.  -- Hepatic encephalopathy: cirrhosis of the liver can cause confusion called Hepatic encephalopathy. Your confusion may be due to Hepatic encephalopathy from your cirrhosis. We can treat this with a medication called lactulose. Please start 20g (30mL) lactulose three times daily. The goal is to adjust the lactulose dose to achieve 3 soft bowel movements per day.  -- We will give the pneumonia and COVID vaccines today in clinic.  -- We recommend that you get vaccinated against shingles with the Shingrix vaccine series. This a 2 dose series over 2 months. You can get this done at your local pharmacy or PCP. Check with medicaid about coverage.

## 2022-06-16 NOTE — Unmapped (Signed)
Ashley Briggs  06/16/2022    Reason for visit: New consultation requested by Chriss Driver, MD for evaluation of Esophageal varices in cirrhosis (CMS-HCC) [K74.60, I85.10]    Assessment/Plan:    Ashley Briggs is a 56 y.o. 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    Cirrhosis: likely secondary to metabolic syndrome associated steatohepatitis.  - MELD labs today  Hepatic encephalopathy: no asterixis but fine tremor on exam; has confusion and word finding difficulties suspicious for subtle Hepatic encephalopathy   - Lactulose 20g three times daily, titrated to goal 3 soft bowel movements per day  Ascites: none on exam; has not required diuretics  - low sodium diet  Varices: grade 2 Esophageal varices on upper endoscopy 05/2022  - start Carvedilol 6.25 nightly; If tolerates well increase to 6.25 twice daily.  - Stop lisinopril  - Stop propanolol  Hepatocellular carcinoma screening: due now  - MRI abdomen with and without contrast Hepatocellular carcinoma protocol ordered  - Every 6 month ultrasounds for ongoing screening after MRI.  - AFP ordered  Transplant: check MELD  Health care maintenance:  - Hepatitis A: check serologies  - Hepatitis B: check serologies  - Hepatitis C: check serologies  - COVID: Booster today  - Pneumococcal: PCV20 today  - Shingles: recommend Shingrix vaccine series.  Weight loss: her 50lb weight loss may be secondary to her diabetes medications (GLP-1 agonist, SGLT2 inhibitor) started in the past 2 years. She has known gastroparesis which is likely contributing to her nausea, possibly worsened by GLP-1 agonist, which is known to exacerbate gastroparesis.  - defer management to PCP.       Return in about 6 months (around 12/17/2022).    Jenita Seashore, MD/PhD  [facility] Fellow    Subjective   History of Present Illness   Ashley Briggs is a 56 y.o. female with type 2 diabetes mellitus complicated by retinopathy and gastroparesis, hypertension, gallstone pancreatitis status post cholecystectomy, nonalcoholic fatty liver disease, psoriasis, seen for evaluation of cirrhosis with esophageal varices.    She reports starting a few years ago she developed abdominal discomfort / pain which she attributed to gas. She has frequent nausea and often vomits. She has right flank tenderness and pain. She reports very frequent confusion starting 4 years ago. She has difficulty remember how to go to places that are familiar to her. She has frequent issues with word-finding difficulty. She is easily irritable and becomes upset in the context of her confusion. She has frequent pruritus that is worse in the past year. She feels this is different from her psoriasis symptoms. She has alternating diarrhea and constipation. Several days of diarrhea then will go several days without a bowel movements. She has been craving sweets/sugary foods that has increased in the past year.    Lower extremity swelling around ankles. No vomiting blood. No hematochezia or black tarry stool. She reports dark urine for 2-3 months or more.    Alcohol: no current alcohol use. In her late 31s she was a heavy drinker but for only 2 years. No alcohol use in the past 2 years. No IV drug use.    She reports a 50lb unintentional weight loss over the past 1-2 years. Her last colonoscopy 2021 with one 3mm polyp in the distal rectum (Path: hyperplastic).    Surgical history: Status  post cholecystectomy    Medications: Dulaglutide, canagliflozin, metofrmin, insulin lantus, amitriptyline, aspirin, brexpiprazole, celecoxib, citalopram, clonazepam, lisinopril, metformin, pantoprazole, simvastatin, tolterodine, albuterol, propranolol 20mg  twice daily.    Objective   Physical Exam   Vital Signs: BP 107/60 (BP Site: R Arm, BP Position: Sitting, BP Cuff Size: Medium)  - Pulse 77  - Temp 36.6 ??C (97.8 ??F) (Temporal)  - Resp 20  - Ht 163.8 cm (5' 4.5)  - Wt (!) 105.1 kg (231 lb 9.6 oz)  - LMP 08/03/2006  - SpO2 97%  - BMI 39.14 kg/m??   Constitutional: She is in no apparent distress  Eyes: Mild scleral icterus  Cardiovascular: No peripheral edema  Gastrointestinal: Obese but soft, no fluid wave appreciable, nontender abdomen without hepatosplenomegaly, hernias or masses  Neurologic: Awake, alert, and oriented to person, place, and time with normal speech with no asterixis. Mild tremor in bilateral hands when held out  Skin: palmar erythema, scattered spider angioma on skin of chest.    Labs:  03/26/2022  CBC: Hemoglobin 11.2, platelets 118  CMP: Creatinine 0.58, sodium 143, total bilirubin 1.3, AST 95, ALT 37, alkaline phosphatase 168  Lipid panel: Total cholesterol 122, HDL 39, LDL 62 (09/12/2018)    Imaging:  CT M pelvis with IV contrast (08/02/2020): Hepatobiliary: Diffuse hepatic steatosis, nodular hepatic contour with hypertrophy of the caudate and left hepatic lobes, gallbladder surgically absent, no biliary dilatation.  Spleen: Enlarged, measuring 16.9 cm.  Vessels: The portal venous system is patent.  The hepatic veins and IVC are unremarkable.  Small caliber upper abdominal varices.  Small caliber recanalized periumbilical vein.    Upper endoscopy (05/05/2022): Grade 2 esophageal varices  30 esophagus, moderate portal hypertensive gastropathy in the entire examined stomach, normal duodenum.    No results found for this visit on 06/16/22.  Diagnosis   Date Value Ref Range Status   05/05/2022   Final    A: Duodenum, biopsy:  -Duodenal mucosa with preserved villous architecture and mild chronic duodenitis.    B: Stomach, biopsy:  -Gastric oxyntic mucosa with mild chronic gastritis and reactive changes.  -Gastric oxyntic mucosa with parietal cell hypertrophy and dilated fundic glands, as seen in hypergastrinemic states (et.g., PPI treatment).  -Negative for Helicobacter organisms on H&E stain.      This electronic signature is attestation that the pathologist personally reviewed the submitted material(s) and the final diagnosis reflects that evaluation.

## 2022-06-16 NOTE — Unmapped (Signed)
M.D. order written for pneumoccocal and Covid Bivalent vaccine.  Fact sheets given and explained with possible side effects of injection site pain, headache or fatigue. Advised patient to seek medical care if any severe side effect such as facial or throat swelling or difficulty breathing.  Reported diagnosis of cancer in 2007 during vaccine questions.  Notified Dr. Jenita Seashore of positive response to question, provider indicated that patient can receive medication.  Patient consented and Vaccines given without difficulty or complaint, see epic documentation.

## 2022-06-17 ENCOUNTER — Ambulatory Visit: Admit: 2022-06-17 | Discharge: 2022-06-18 | Payer: PRIVATE HEALTH INSURANCE

## 2022-06-17 DIAGNOSIS — K7460 Unspecified cirrhosis of liver: Secondary | ICD-10-CM | POA: Insufficient documentation

## 2022-06-17 DIAGNOSIS — K7682 Hepatic encephalopathy: Secondary | ICD-10-CM | POA: Insufficient documentation

## 2022-06-17 DIAGNOSIS — I8510 Secondary esophageal varices without bleeding: Secondary | ICD-10-CM | POA: Insufficient documentation

## 2022-06-17 MED ADMIN — triamcinolone acetonide (KENALOG) injection 20 mg: 20 mg | INTRA_ARTICULAR | @ 20:00:00 | Stop: 2022-06-17

## 2022-06-17 NOTE — Unmapped (Signed)
Plan:Large Volume injection for Adhesive Capsulitis    You had a large volume injection today. 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 Your shoulder was first numbed with lidocaine followed by an injection that included a large amount of sterile saline and a very small amount of cortisone, or steroid. The steroid we use is called Kenalog. The volume in the injection is meant to stretch out your shoulder capsule which is stuck down onto the joint structures limiting your motion and causing pain.     You received a corticosteroid injection to reduce pain and inflammation.  Please note that it can take up to 2 weeks for this injection to fully work.  While many people will feel relief sooner, please be patient.      The injection contained a corticosteroid and a numbing agent.  The numbing agent can last for 1-6 hours.  After this wears off you may have increased pain until the steroid has a chance to work.    What are some of the possible side effects of a steroid injection?    Common side effects:  temporarily elevated blood sugar (in diabetic patients) that can last a few days   flushing of the skin, especially the face  temporary rise in blood pressure  discoloration or atrophy of the skin at the injection site    Call your doctor at once if you have:  persistent worsening pain or swelling, fever;  blurred vision, tunnel vision, eye pain, or seeing halos around lights;  fast or slow heartbeats;  increased blood pressure that is associated with severe headache, blurred vision, pounding in your neck or ears, anxiety, nosebleed;  headaches, ringing in your ears, dizziness, nausea, vision problems, pain behind your eyes    This is not a complete list of side effects and others may occur. Call your doctor for medical advice about side effects.     What other drugs may be affected after the injection?  Many drugs can interact with steroids. Not all possible interactions are listed here. Tell your doctor about all your current medicines and any you start or stop using, especially:  an antibiotic or antifungal medication;  birth control pills or hormone replacement therapy;  a blood thinner (warfarin, Coumadin, and others);  a diuretic or water pill;  insulin or oral diabetes medicine;  medicine to treat tuberculosis;  a nonsteroidal anti-inflammatory drug or NSAID (aspirin, ibuprofen, naproxen, diclofenac, indomethacin, Advil, Aleve, Celebrex, and many others); or  seizure medication.          It is very important that you go to Physical therapy or do the exercises right away and regularly so that your shoulder does not lose the range of motion we were able to gain in clinic today. You may experience some soreness afterwards and this can last for 3-4 hours.      You can take Tylenol if you do have pain following the injection, but the most important goal is to keep your shoulder moving. This procedure is not an immediate fix but rather a ???jump start??? to getting better.  It is critical that you do your home exercises every day.          Thank you for coming to [facility] and our clinic today!     We aim to provide you with the highest quality, individualized care.  If you have any unanswered questions after the visit, please do not hesitate to reach out to [place] on MyChart  or leave a message for the nurse.  ?  MyChart messages: These messages can be sent to your provider and will be checked by their clinical support staff.? The messages are checked throughout the day during normal business hours from 8:30 am-4:00 pm Monday-Friday, however responses may take up to 48 hours.? Please use this method of communication for non-urgent and non-emergent concerns, questions, refill requests or inquiries only.? ?Our team will help respond to all of your questions.? Please note that you may be asked to see a provider by either a telehealth or in person visit if it is deemed your questions are best handled in the clinic setting in person.??  ?  Please keep in mind, these messages are not real time communications, so be patient when waiting for a response.    If you do not have access to MyChart, do not know how to use MyChart or have an issue that may require more extensive discussion, please call the nurses' call line: [phone] ([facility]) and [phone] ([place]).? This line is checked throughout the day and will be responded to as time allows.? Please note that return calls could take up to 48 hours, depending on the nature of the need.?  ?  If you have an issue that requires emergent attention that cannot wait; consider coming to our [facility] walk-in clinic, or go to the nearest Emergency Department.    If you need to schedule future appointments, please call [phone].     We look forward to seeing you again in the future and appreciate you choosing [facility] for your care!    Thank you,    Lilla Shook, MD, FAAFP            We provide innovative and comprehensive patient centered care that is supported by evidence-based research                                                                                                    RESEARCH PARTICIPATION    Please check out our current research studies to see if you or someone you know may qualify at:    [email or web address]

## 2022-06-17 NOTE — Unmapped (Signed)
SPORTS MEDICINE NEW VISIT    ASSESSMENT AND PLAN      Diagnosis ICD-10-CM Associated Orders   1. Adhesive capsulitis of left shoulder  M75.02 AMB REFERRAL TO PHYSICAL THERAPY     Lg Joint Inj: L glenohumeral      2. Left shoulder pain, unspecified chronicity  M25.512 AMB REFERRAL TO PHYSICAL THERAPY           Patient presents today after recently being seen by orthopedics for which they completed subacromial injection without improvement and ultimately was diagnosed with adhesive capsulitis.  Exam today consistent with adhesive capsulitis given decreased range of motion in all directions, generalized shoulder pain, and multiple positive special tests.  Had a discussion with her about best treatment methods moving forward and and ultimately after discussion patient wanted to move forward with large-volume glenohumeral joint injection, see procedure note below.  Given the patient has not yet established care with physical therapy, provided her home exercise program with instructions to start completing the exercises tomorrow on a regular basis to help regain her range of motion and improve her pain.  We will have her follow-up in 2 weeks for reassessment.  As far as her pain and numbness/tingling that goes down her left arm most consistent with a radiculopathy coming from the cervical region, and discussed with her that if symptoms persist would recommend likely referral to the spine center for further evaluation.  Patient was amenable this plan had no further questions or concerns.    Return in about 2 weeks (around 07/01/2022).    Procedure(s):  Large Volume Glenohumeral Joint Injection with manipulation    Consent   After discussing the various treatment options for the condition, It was agreed that a large volume injection with manipulation would be the next appropriate step in treatment. The nature of and the indications for this  injection were reviewed in detail with the patient today. The inherent risks of injection including infection, allergic reaction, increased pain, incomplete relief or temporary relief of symptoms, alterations of blood glucose levels requiring careful monitoring and treatment as indicated, tendon, ligament or articular cartilage rupture or degeneration, nerve injury, skin depigmentation, and/or fatty atrophy were discussed.   Procedure   After the risks and benefits of the procedure were explained,verbal consent was given, and a procedural time-out was performed. The glenohumeral joint and surrounding structures were visualized with ultrasound and the glenohumeral joint space was identified  The site for the injection was properly marked and prepped with Chlorhexadine solution.  The injection site was anesthetized with ethyl chloride and 3cc of 1% Lidocaine with a 25 gauge 1.5 inch needle. Using ultrasound guidance, the glenohumeral joint was visualized and injected with a total of 25 cc of fluid including sterile water, ropivacaine, and 20 milligrams of Kenalog using a sterile technique and a 18 gauge 3.5 inch needle.  Capsular distention was visualized during the entire procedure.  During injection, there was unrestricted flow and care was taken not to inject corticosteroid into the skin or subcutaneous tissues. There were no complications during the procedure.      Post procedure a sterile band-aide was applied.  Then the patient was placed in a supine position we then did a manipulation in clinic in the forward flexion abduction internal and external rotation directions.  We were able to gain 15 degrees of range of motion in abduction, ER, and forward flexion.  The patient tolerated the manipulation well with only mild to moderate discomfort.  Post-injection instructions were  given regarding post-procedure care, when to follow up in clinic and what to expect from the procedure. The patient tolerated the injection well and was discharged without complication.    NEED FOR SONOGRAPHIC GUIDANCE    Given the complexity of this problem, the anatomic location of this structure, sonographic guidance is recommended to prevent injury to neurovascular structures and confirm accuracy of injection. The accuracy of doing these injections blind is poor and the benefit to the patient by using ultrasound guidance is significant to avoid complications.     Reference:  American Medical Society for Sports Medicine (AMSSM) position statement: interventional musculoskeletal ultrasound in sports medicine.  Morey Hummingbird MM, Adams E, Berkoff D, Concoff AL, Jiles Crocker J Sports Med. 2014 Oct 20. pii: [id number]. doi: 10.1136/[id number]        SUBJECTIVE     Chief Complaint:   Chief Complaint   Patient presents with   ??? Left Shoulder - Pain     Notes no change in pain, but numbness has become more constant.       History of Present Illness: 56 y.o. female who presents for evaluation of left shoulder pain that she states is chronic.  She states that pain has been going on for close to 2 months and is located in the left shoulder, both anterior and laterally with decreased range of motion.  She also endorses some numbness and tingling going down the arm that were initially on the fourth and fifth digits of the left hand.  Of note, patient was seen orthopedics on 05/02/2022 for similar symptoms and at that time completed a subacromial injection for pain.  She was seen back on 05/16/2022 and at that time had very little improvement despite the subacromial injection and ultimately was diagnosed with adhesive capsulitis and patient was referred for PT and referred to sports medicine for further evaluation.  Of note patient has not yet started physical therapy.  She currently is not taking anything for her pain.  She denies any neck pain associated with the numbness or tingling that goes down the arms now expands into the entire hand.    Past Medical History:   Past Medical History:   Diagnosis Date   ??? Anxiety    ??? Blepharitis    ??? Cancer (CMS-HCC)     endometrial 2007   ??? Diabetes mellitus (CMS-HCC)    ??? Diabetic retinopathy (CMS-HCC)     Mild NPDR w/ME, left eye   ??? Endometrial cancer (CMS-HCC) 2007   ??? Gastroparesis    ??? Heart disease    ??? High cholesterol    ??? Hypertension    ??? Liver disease    ??? Non-alcoholic fatty liver disease    ??? 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 Psoriasis    ??? Ptosis of eyelid, left     Left upper eyelid   ??? Reflux          OBJECTIVE     Physical Exam:  Vitals:   Wt Readings from Last 3 Encounters:   06/16/22 (!) 105.1 kg (231 lb 9.6 oz)   05/05/22 100.7 kg (222 lb)   03/12/22 (!) 104.2 kg (229 lb 12.8 oz)     Estimated body mass index is 39.14 kg/m?? as calculated from the following:    Height as of 06/16/22: 163.8 cm (5' 4.5).    Weight as of 06/16/22: 105.1 kg (231 lb 9.6 oz).  Gen: Well-appearing female in no acute distress  MSK:    Left Shoulder Exam    INSPECTION: No swelling, erythema, bruising or atrophy. Symmetric muscle mass, no AC joint asymmetry.  No prominence at medial scapular border  PALPATION: TTP at: subacromial recess and greater tuberosity of humerus  AROM:  0-90 of flexion   0-90 of abduction   20 deg ER @ neutral   back scratch could not be completed  Scapular dyskenesis absent.  Full ROM at C-spine.   PROM:          ER @ 90 deg abd: hard stop to 30                        IR @ 90 deg abd: hard stop to 40        STRENGTH: Supraspinatus: 5/5   Subscapularis 5/5   Infraspinatus/teres minor: 5/5   Deltoid (C5): 5/5   Biceps (C6):    Triceps/wrist extension (C7): 5/5   Grip (C8):5/5   Interossei (T1): 5/5  NEUROVASCULAR: 2+ radial pulse, intact sensation to multiple dermatomes distally, <2 sec distal capillary refill  SPECIAL TESTS:      Jobe's: painful, no weakness  Drop Arm: deferred  Apley Lift-off/Belly Press: negative  ER lag (45): negative  Hawkin's: positive  Neer's: positive  AC Joint: Cross-Arm Compression: positive  Biceps/Labrum:   Speed's: negative  O'Briens: negative  Biceps I and ZO:[id number]  Instability:  Apprehension: deferred, relocation deferred  Jerk (post): deferred  Load and Shift (ant): Grade I  Sulcus sign (inf): deferred    Thoracic Outlet:   Roos: (-)    Adson's: (-)   Eden's: (-)   Wright's: (-)  Neck: Spurling (-)      Imaging/other tests:   EXAM: XR SHOULDER 3 OR MORE VIEWS LEFT   DATE: 05/16/2022 10:05 AM     Mild acromioclavicular and glenohumeral joint osteoarthrosis, similar to prior.     Orthopaedic PROMIS:  [facility] PROMIS 06/17/2022   Upper Extremity Physical Function CAT Score 14   Pain Interference CAT Score 34           06/17/2022   [facility]   Upper Extremity Physical Function CAT Score 14   Pain Interference CAT Score 34       PRO Status:  Patient completed PRO.      ADMINISTRATIVE     I have personally reviewed and interpreted the images (as available).  Point-of-care ultrasound imaging is on file and stored in a permanent location (if performed).  I have personally reviewed prior records and incorporated relevant information above (as available).    MEDICAL DECISION MAKING (level of service defined by 2/3 elements)     Number/Complexity of Problems Addressed 2 or more stable chronic illnesses (99204/99214)   Amount/Complexity of Data to be Reviewed/Analyzed 2 points: Review prior notes (1 point per unique source); Review test results (1 point per unique test); Order tests (1 point per unique test) (99203/99213)   Risk of Complications/Morbidity/Mortality of Management Decision for MINOR Surgery (Including Injection) WITH Risk Factors (99204/99214)     TIME     Total Time for E/M Services on the Date of Encounter Time-based coding not utilized for this encounter     CONSULTATION     Consultation services provided No     MODIFIER -25     Significant, Separately Billable Evaluation and Management YES - The patient's condition required a significant, separately identifiable, medical  necessary evaluation and management service by the provider in addition to the procedure performed on the same date of service. The evaluation and management service was above and beyond the usual preoperative and postoperative care associated with the procedure.       PROCEDURES     Lg Joint Inj: L glenohumeral on 06/17/2022 3:50 PM  Indications: pain  Details: ultrasound-guided  Laterality: left  Location: shoulder  Medications: 20 mg triamcinolone acetonide 10 mg/mL    Medical Care Team Attestation: All ProcDoc orders were read back and verbally confirmed with the procedure provider, including but not limited to patient name, medication name, dose, and route, before any actions were taken.  Provider Attestation: The information documented by members of my medical care team was reviewed and verified for accuracy by me.             DME     DME ORDER:  Dx:  ,

## 2022-06-17 NOTE — Unmapped (Signed)
Received message from Dr. Wallace Cullens to schedule MRI Abd for this patient to screen for [facility].  Appointment scheduled for 06-26-22 at 1245 at [facility] location. Patient to be NPO 2 hr prior to procedure.  Called patient and provided information about the appointment   Asked if she has ever worked with metal, any implanted devices, any metal in her joints, etc. UNABLE to open the screening form to complete it.

## 2022-06-18 LAB — HEPATITIS B CORE ANTIBODY, TOTAL: HEPATITIS B CORE TOTAL ANTIBODY: NONREACTIVE

## 2022-06-18 LAB — HEPATITIS A IGG: HEPATITIS A IGG: NONREACTIVE

## 2022-06-18 LAB — HEPATITIS B SURFACE ANTIBODY
HEPATITIS B SURFACE ANTIBODY QUANT: <8 m[IU]/mL (ref <8.00)
HEPATITIS B SURFACE ANTIBODY: NONREACTIVE

## 2022-06-18 LAB — HEPATITIS C ANTIBODY: HEPATITIS C ANTIBODY: NONREACTIVE

## 2022-06-18 LAB — HEPATITIS B SURFACE ANTIGEN: HEPATITIS B SURFACE ANTIGEN: NONREACTIVE

## 2022-06-19 DIAGNOSIS — Z794 Long term (current) use of insulin: Principal | ICD-10-CM

## 2022-06-19 DIAGNOSIS — E1169 Type 2 diabetes mellitus with other specified complication: Principal | ICD-10-CM

## 2022-06-19 LAB — TB MITOGEN: TB MITOGEN VALUE: 9.83

## 2022-06-19 LAB — QUANTIFERON TB GOLD PLUS
QUANTIFERON ANTIGEN 1 MINUS NIL: 0 [IU]/mL
QUANTIFERON ANTIGEN 2 MINUS NIL: 0 [IU]/mL
QUANTIFERON MITOGEN: 9.8 [IU]/mL
QUANTIFERON TB GOLD PLUS: NEGATIVE
QUANTIFERON TB NIL VALUE: 0.03 [IU]/mL

## 2022-06-19 LAB — TB AG2: TB AG2 VALUE: 0.03

## 2022-06-19 LAB — TB AG1: TB AG1 VALUE: 0.03

## 2022-06-19 LAB — TB NIL: TB NIL VALUE: 0.03

## 2022-06-19 MED ORDER — INSULIN GLARGINE (U-100) 100 UNIT/ML (3 ML) SUBCUTANEOUS PEN
Freq: Every evening | SUBCUTANEOUS | 6 refills | 22 days | Status: CP
Start: 2022-06-19 — End: 2023-06-19
  Filled 2022-06-23: qty 15, 22d supply, fill #0

## 2022-06-20 MED ORDER — PEN NEEDLE, DIABETIC 32 GAUGE X 1/4" (6 MM)
3 refills | 0 days
Start: 2022-06-20 — End: ?

## 2022-06-23 MED FILL — ULTICARE PEN NEEDLE 32 GAUGE X 1/4" (6 MM): 90 days supply | Qty: 100 | Fill #0

## 2022-06-26 ENCOUNTER — Ambulatory Visit: Admit: 2022-06-26 | Discharge: 2022-06-27 | Payer: PRIVATE HEALTH INSURANCE

## 2022-06-26 MED ADMIN — gadobenate dimeglumine (MULTIHANCE) 529 mg/mL (0.1mmol/0.2mL) solution 10 mL: 10 mL | INTRAVENOUS | @ 17:00:00 | Stop: 2022-06-26

## 2022-06-27 DIAGNOSIS — E785 Hyperlipidemia, unspecified: Principal | ICD-10-CM

## 2022-06-27 MED ORDER — SIMVASTATIN 20 MG TABLET
ORAL_TABLET | Freq: Every evening | ORAL | 1 refills | 90 days
Start: 2022-06-27 — End: 2023-06-27
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
## 2022-06-29 ENCOUNTER — Other Ambulatory Visit: Payer: Self-pay | Admitting: Psychiatry

## 2022-06-30 DIAGNOSIS — L409 Psoriasis, unspecified: Principal | ICD-10-CM

## 2022-06-30 MED ORDER — SIMVASTATIN 20 MG TABLET
ORAL_TABLET | Freq: Every evening | ORAL | 1 refills | 90 days | Status: CP
Start: 2022-06-30 — End: 2023-06-30
  Filled 2022-06-30: qty 90, 90d supply, fill #0

## 2022-06-30 MED ORDER — STELARA 90 MG/ML SUBCUTANEOUS SYRINGE
3 refills | 0 days | Status: CN
Start: 2022-06-30 — End: ?

## 2022-06-30 MED FILL — CELECOXIB 200 MG CAPSULE: ORAL | 30 days supply | Qty: 60 | Fill #2

## 2022-06-30 MED FILL — ASPIRIN 81 MG TABLET,DELAYED RELEASE: ORAL | 90 days supply | Qty: 90 | Fill #3

## 2022-06-30 MED FILL — TRULICITY 1.5 MG/0.5 ML SUBCUTANEOUS PEN INJECTOR: SUBCUTANEOUS | 28 days supply | Qty: 4 | Fill #4

## 2022-07-01 ENCOUNTER — Encounter: Payer: Self-pay | Admitting: Psychiatry

## 2022-07-01 ENCOUNTER — Ambulatory Visit (INDEPENDENT_AMBULATORY_CARE_PROVIDER_SITE_OTHER): Payer: Medicaid Other | Admitting: Psychiatry

## 2022-07-01 ENCOUNTER — Ambulatory Visit: Admit: 2022-07-01 | Discharge: 2022-07-02 | Payer: PRIVATE HEALTH INSURANCE

## 2022-07-01 VITALS — BP 114/74 | HR 78 | Temp 98.1°F | Wt 234.0 lb

## 2022-07-01 DIAGNOSIS — L409 Psoriasis, unspecified: Principal | ICD-10-CM

## 2022-07-01 DIAGNOSIS — Z79899 Other long term (current) drug therapy: Principal | ICD-10-CM

## 2022-07-01 DIAGNOSIS — G4700 Insomnia, unspecified: Secondary | ICD-10-CM | POA: Diagnosis not present

## 2022-07-01 DIAGNOSIS — F4010 Social phobia, unspecified: Secondary | ICD-10-CM

## 2022-07-01 DIAGNOSIS — F331 Major depressive disorder, recurrent, moderate: Secondary | ICD-10-CM | POA: Diagnosis not present

## 2022-07-01 MED ORDER — HUMIRA PEN PSORIASIS-UVEITIS-ADOL HID SUP START 40 MG/0.8 ML SUBCUT KT
Freq: Once | SUBCUTANEOUS | 0 refills | 1 days | Status: CP
Start: 2022-07-01 — End: 2022-07-01

## 2022-07-01 MED ORDER — ADALIMUMAB 40 MG/0.8 ML SUBCUTANEOUS SYRINGE KIT
SUBCUTANEOUS | 0 refills | 14.00000 days | Status: CP
Start: 2022-07-01 — End: ?
  Filled 2022-07-10: qty 3, 28d supply, fill #0

## 2022-07-01 MED ORDER — ADALIMUMAB 40 MG/0.8 ML SUBCUTANEOUS PEN KIT
2 refills | 0 days | Status: CP
Start: 2022-07-01 — End: 2022-07-01

## 2022-07-01 MED ORDER — BUPROPION HCL ER (XL) 150 MG PO TB24
150.0000 mg | ORAL_TABLET | Freq: Every day | ORAL | 1 refills | Status: DC
Start: 2022-07-01 — End: 2022-08-26

## 2022-07-01 MED ORDER — CITALOPRAM HYDROBROMIDE 40 MG PO TABS
40.0000 mg | ORAL_TABLET | Freq: Every morning | ORAL | 2 refills | Status: DC
Start: 2022-07-31 — End: 2022-10-07

## 2022-07-01 NOTE — Unmapped (Signed)
Dermatology Note     Assessment and Plan:        Plaque psoriasis, trunk and extremities, ~30% BSA, not well-controlled on Ustekinumab (Stelara), failed Cosentyx, chronic not at treatment goal  Failed Cosentyx, started in 09/2019 with improvement, then stopped for about 4-5 months and restarted in May 2022 without loading doses. No improvement since restarted Cosentyx for another 6 months. No improvement on Ustekinumab [name]) from Jan 2023 to August 2023  - Start adalimumab (HUMIRA PEN PSOR-UVEITS-ADOL HS) 40 mg/0.8 mL injection PEN; Inject 1.6 mL (80 mg total) under the skin once for 1 dose loading dose and then adalimumab (HUMIRA) 40 mg/0.8 mL injection; Inject 0.8 mL (40 mg total) under the skin every fourteen (14) days.  - Due to recent diagnosis of cirrhosis, will contact liver doctor Dr. Eudelia Bunch in coordinating care. Sent staff message to GI team.  - If not well controlled, can also consider risankizumab Cristy Folks), would avoid Methotrexate  due to liver issues  - Has known osteoarthritis in knees, no other joint pain    High risk medication use, on Ustekinumab (Stelara) planning on switching to adalimumab (Humira)  - Negative quant gold and hepatitis panel on 06/16/22    The patient was advised to call for an appointment should any new, changing, or symptomatic lesions develop.     RTC: Return in about 3 months (around 10/01/2022). or sooner as needed   _________________________________________________________________      Chief Complaint     Chief Complaint   Patient presents with    Follow-up     Psoriasis is not doing well since going off Stelara       HPI     Ashley Briggs is a 56 y.o. female who presents as a returning patient (last seen 12/17/2021) to Dermatology for follow up of psoriasis. At last visit, was started on Ustekinumab (Stelara). Has gotten it twice, says that it has not improved anything. Has not been using topicals.     Recently diagnosed with cirrhosis due to fatty liver, follows with Dr. Eudelia Bunch. Has knee pain but known osteoarthritis, denies other joint pain.    The patient denies any other new or changing lesions or areas of concern.     Pertinent Past Medical History     No history of skin cancer  Cirrhosis  Psoriasis    Problem List    None      Family History:   Negative for melanoma    Past Medical History, Family History, Social History, Medication List, Allergies, and Problem List were reviewed in the rooming section of Epic.     ROS: Other than symptoms mentioned in the HPI, no fevers, chills, or other skin complaints    Physical Examination     GENERAL: Well-appearing female in no acute distress, resting comfortably.  NEURO: Alert and oriented, answers questions appropriately  PSYCH: Normal mood and affect  RESP: No increased work of breathing  SKIN: Examination of the abdomen, back, bilateral upper extremities, bilateral lower extremities, hands, feet, nails, and buttocks was performed  Well-demarcated erythematous plaques with silvery scale distributed over the extensor surfaces of the elbows and knees, abdomen, periumbilically, scalp, hairline, forearms, legs, trunk, buttocks around 30% BSA involved    All areas not commented on are within normal limits or unremarkable      (Approved Template 08/13/2020)

## 2022-07-01 NOTE — Unmapped (Signed)
[facility] releases most results to you as soon as they are available. Therefore, you may see some results before we do. Please give us 3 business days to review the tests and contact you by phone or through MyChart. If you are concerned that some results may be upsetting or confusing, you may wish to wait until we contact you before looking at the report in MyChart.   If you have an urgent question, you can send us a message or call our clinic. Otherwise, we prefer that you wait 3 business days for us to contact you.    Dermatology Clinical Team

## 2022-07-02 DIAGNOSIS — L409 Psoriasis, unspecified: Principal | ICD-10-CM

## 2022-07-07 MED FILL — PANTOPRAZOLE 40 MG TABLET,DELAYED RELEASE: ORAL | 90 days supply | Qty: 180 | Fill #1

## 2022-07-07 NOTE — Unmapped (Signed)
[facility] Specialty Medication Onboarding    Specialty Medication: HUMIRA 40 mg/0.8 mL injection (adalimumab)  Prior Authorization: Approved   Financial Assistance: No - copay  <$25  Final Copay/Day Supply: $4 / 34 (loading)          $4 / 28 (maintenance)    Insurance Restrictions: Yes - max 1 month supply     Notes to Pharmacist: n/a    The triage team has completed the benefits investigation and has determined that the patient is able to fill this medication at [facility]. Please contact the patient to complete the onboarding or follow up with the prescribing physician as needed.

## 2022-07-07 NOTE — Unmapped (Unsigned)
*incomplete documentation, working up patient, Freight forwarder [facility]   Patient Onboarding/Medication Counseling    [name] is Ashley Briggs 56 y.o. female with psoriasis who I am counseling today on initiation of therapy.  I am speaking to the patient.    Was Ashley Briggs Nurse, learning disability used for this call? No    Verified patient's date of birth / HIPAA.    Specialty medication(s) to be sent: Inflammatory Disorders: Humira      Non-specialty medications/supplies to be sent: ***      Medications not needed at this time: ***         Humira (adalimumab)    Medication & Administration     Dosage: Plaque psoriasis: Inject 80mg  under the skin on day 1, then 40mg  every 14 days starting on day 8    Lab tests required prior to treatment initiation:  Tuberculosis: Tuberculosis screening resulted in Ashley Briggs non-reactive Quantiferon TB Gold assay.  Hepatitis B: Hepatitis B serology studies are complete and non-reactive.    Administration:     Prefilled auto-injector pen  1. Gather all supplies needed for injection on Ashley Briggs clean, flat working surface: medication pen removed from packaging, alcohol swab, sharps container, etc.  2. Look at the medication label - look for correct medication, correct dose, and check the expiration date  3. Look at the medication - the liquid visible in the window on the side of the pen device should appear clear and colorless  4. Lay the auto-injector pen on Ashley Briggs flat surface and allow it to warm up to room temperature for at least 30-45 minutes  5. Select injection site - you can use the front of your thigh or your belly (but not the area 2 inches around your belly button); if someone else is giving you the injection you can also use your upper arm in the skin covering your triceps muscle  6. Prepare injection site - wash your hands and clean the skin at the injection site with an alcohol swab and let it air dry, do not touch the injection site again before the injection  7. Pull the 2 safety caps straight off - gray/white to uncover the needle cover and the plum cap to uncover the plum activator button, do not remove until immediately prior to injection and do not touch the white needle cover  8. Gently squeeze the area of cleaned skin and hold it firmly to create Ashley Briggs firm surface at the selected injection site  9. Put the white needle cover against your skin at the injection site at Ashley Briggs 90 degree angle, hold the pen such that you can see the clear medication window  10. Press down and hold the pen firmly against your skin, press the plum activator button to initiate the injection, there will be Ashley Briggs click when the injection starts  11. Continue to hold the pen firmly against your skin for about 10-15 seconds - the window will start to turn solid yellow  12. To verify the injection is complete after 10-15 seconds, look and ensure the window is solid yellow and then pull the pen away from your skin  13. Dispose of the used auto-injector pen immediately in your sharps disposal container the needle will be covered automatically  14. If you see any blood at the injection site, press Ashley Briggs cotton ball or gauze on the site and maintain pressure until the bleeding stops, do not rub the injection site    Adherence/Missed dose instructions:  If your injection is given more than 3 days after your scheduled injection date - consult your pharmacist for additional instructions on how to adjust your dosing schedule.    Goals of Therapy     - Achieve remission/inactive disease or low/minimal disease activity  - Maintenance of function  - Minimization of systemic manifestations and comorbidities  - Maintenance of effective psychosocial functioning    Side Effects & Monitoring Parameters     Injection site reaction (redness, irritation, inflammation localized to the site of administration)  Signs of Ashley Briggs common cold - minor sore throat, runny or stuffy nose, etc.  Upset stomach  Headache    The following side effects should be reported to the provider:  Signs of Ashley Briggs hypersensitivity reaction - rash; hives; itching; red, swollen, blistered, or peeling skin; wheezing; tightness in the chest or throat; difficulty breathing, swallowing, or talking; swelling of the mouth, face, lips, tongue, or throat; etc.  Reduced immune function - report signs of infection such as fever; chills; body aches; very bad sore throat; ear or sinus pain; cough; more sputum or change in color of sputum; pain with passing urine; wound that will not heal, etc.  Also at Ashley Briggs slightly higher risk of some malignancies (mainly skin and blood cancers) due to this reduced immune function.  In the case of signs of infection - the patient should hold the next dose of Humira?? and call your primary care provider to ensure adequate medical care.  Treatment may be resumed when infection is treated and patient is asymptomatic.  Changes in skin - Ashley Briggs new growth or lump that forms; changes in shape, size, or color of Ashley Briggs previous mole or marking  Signs of unexplained bruising or bleeding - throwing up blood or emesis that looks like coffee grounds; black, tarry, or bloody stool; etc.  Signs of new or worsening heart failure - shortness of breath; sudden weight gain; heartbeat that is not normal; swelling in the arms or legs that is new or worse      Contraindications, Warnings, & Precautions     Have your bloodwork checked as you have been told by your prescriber  Talk with your doctor if you are pregnant, planning to become pregnant, or breastfeeding  Discuss the possible need for holding your dose(s) of Humira?? when Ashley Briggs planned procedure is scheduled with the prescriber as it may delay healing/recovery timeline       Drug/Food Interactions     Medication list reviewed in Epic. The patient was instructed to inform the care team before taking any new medications or supplements. {Blank single:19197::No drug interactions identified,***}.   Talk with you prescriber or pharmacist before receiving any live Precautions, & Disposal     Store this medication in the refrigerator.  Do not freeze  If needed, you may store at room temperature for up to 14 days  Store in original packaging, protected from light  Do not shake  Dispose of used syringes/pens in Ashley Briggs sharps disposal container          Current Medications (including OTC/herbals), Comorbidities and Allergies     Current Outpatient Medications   Medication Sig Dispense Refill    HUMIRA PEN CITRATE FREE STARTER PACK FOR PS/UV 1X 80 MG/0.8 ML, 2X 40 MG/0.4 ML Inject the contents of 1 pen (80 mg total) under the skin on day 1, then inject the contents of 1 pen (40 mg total) under the skin every other week starting 1 week after initial dose. Loading  dose. 3 each 0    adalimumab (HUMIRA) 40 mg/0.8 mL injection Inject the contents of 1 pen (40 mg total) under the skin every fourteen (14) days. Maintenance dose. 2 each 0    amitriptyline (ELAVIL) 10 MG tablet TAKE ONE TABLET BY MOUTH ONCE DAILY AT BEDTIME. (Patient not taking: Reported on 07/01/2022)      aspirin (ECOTRIN) 81 MG tablet Take 1 tablet (81 mg total) by mouth daily. 90 tablet 3    blood-glucose meter Misc Use as directed by provider. 1 each 0    canagliflozin (INVOKANA) 300 mg Tab tablet Take 1 tablet (300 mg total) by mouth every morning before breakfast. 90 tablet 0    carvediloL (COREG) 6.25 MG tablet Take 1 tablet (6.25 mg total) by mouth Two (2) times Ashley Briggs day. Start with 1 tablet nightly, if tolerating well increase to twice daily 180 tablet 3    celecoxib (CELEBREX) 200 MG capsule Take 1 capsule (200 mg total) by mouth two (2) times Ashley Briggs day as needed for pain. 180 capsule 1    citalopram (CELEXA) 40 MG tablet Take 1 tablet (40 mg total) by mouth daily.      clonazePAM (KLONOPIN) 0.5 MG tablet Take 1 tablet (0.5 mg total) by mouth nightly as needed for anxiety and sleep. May repeat dose once. 40 tablet 1    cyclobenzaprine (FLEXERIL) 5 MG tablet Take 1-2 tablets by mouth every 8 hours as needed muscle pain or iron) TbER Take 1 tablet (142 mg) by mouth two (2) times Ashley Briggs day. 60 tablet 3    insulin glargine (LANTUS SOLOSTAR U-100 INSULIN) 100 unit/mL (3 mL) injection pen Inject 0.66 mL (66 Units total) under the skin nightly. 15 mL 6    lactulose 10 gram/15 mL solution Take 30 mL (20 g total) by mouth Three (3) times Ashley Briggs day. 240 mL 0    lancets 30 gauge Misc Use as directed to test once daily. 100 each 3    metFORMIN (GLUCOPHAGE-XR) 500 MG 24 hr tablet Take 2 tablets (1,000 mg total) by mouth in the morning and 2 tablets (1,000 mg total) in the evening. Take with meals. 360 tablet 1    pantoprazole (PROTONIX) 40 MG tablet Take 1 tablet (40 mg total) by mouth Two (2) times Ashley Briggs day. 180 tablet 1    pen needle, diabetic 32 gauge x 1/4 (6 mm) Ndle Use as directed to inject Lantus 100 each 3    simvastatin (ZOCOR) 20 MG tablet Take 1 tablet (20 mg total) by mouth every evening. 90 tablet 1     No current facility-administered medications for this visit.       No Known Allergies    Patient Active Problem List   Diagnosis    PFD (pelvic floor dysfunction)    Type 2 diabetes mellitus, with long-term current use of insulin (CMS-HCC)    Hemorrhage of rectum and anus    Hyperlipidemia    Hypertension    Morbid obesity (CMS-HCC)    Dry eye syndrome, bilateral    Angular blepharoconjunctivitis of both eyes    Cataract associated with type 2 diabetes mellitus (CMS-HCC)    H/O diabetic gastroparesis    Retinopathy of left eye, background, proliferative    Coronary artery disease involving native heart without angina pectoris    Hepatic steatosis    Diabetes mellitus (CMS-HCC)    Diabetic retinopathy (CMS-HCC)    Ptosis of eyelid, left    Esophageal varices in cirrhosis (CMS-HCC)  Hepatic cirrhosis (CMS-HCC)    Secondary esophageal varices without bleeding (CMS-HCC)    Hepatic encephalopathy (CMS-HCC)       Reviewed and up to date in Epic.    Appropriateness of Therapy     Acute infections noted within Epic:  No active infections  Patient reported infection: {Blank single:19197::None,***- patient reported to provider,***- pharmacy reported to provider}    Is medication and dose appropriate based on diagnosis and infection status? {Blank single:19197::Yes,No - evidence provided by prescriber in *** note}    Prescription has been clinically reviewed: {Blank single:19197::Yes,***}      Baseline Quality of Life Assessment      {DiseaseSpecificQOL:73897}    Financial Information     Medication Assistance provided: {sscmedassist:65303}    Anticipated copay of $*** reviewed with patient. Verified delivery address.    Delivery Information     Scheduled delivery date: ***    Expected start date: ***    Medication will be delivered via {Blank:19197::UPS,Next Day Courier,Same Day Courier,Clinic Courier - *** clinic,***} to the {Blank:19197::prescription,temporary} address in Epic WAM.  This shipment {Blank single:19197::will,will not} require Ashley Briggs signature.      Explained the services we provide at [facility] and that each month we would call to set up refills.  Stressed importance of returning phone calls so that we could ensure they receive their medications in time each month.  Informed patient that we should be setting up refills 7-10 days prior to when they will run out of medication.  Ashley Briggs pharmacist will reach out to perform Ashley Briggs clinical assessment periodically.  Informed patient that Ashley Briggs welcome packet, containing information about our pharmacy and other support services, Ashley Briggs Notice of Privacy Practices, and Ashley Briggs drug information handout will be sent.      The patient or caregiver noted above participated in the development of this care plan and knows that they can request review of or adjustments to the care plan at any time.      Patient or caregiver verbalized understanding of the above information as well as how to contact the pharmacy at [phone] option 4 with any questions/concerns.  The pharmacy is open Monday through Friday 8:30am-4:30pm.  Ashley Briggs pharmacist is available 24/7 via pager to answer any clinical questions they may have.    Patient Specific Needs     Does the patient have any physical, cognitive, or cultural barriers? {Blank single:19197::No,Yes - ***}    Does the patient have adequate living arrangements? (i.e. the ability to store and take their medication appropriately) {Blank single:19197::Yes,No - ***}    Did you identify any home environmental safety or security hazards? {Blank single:19197::No,Yes - ***}    Patient prefers to have medications discussed with  {Blank single:19197::Patient,Family Member,Caregiver,Other}     Is the patient or caregiver able to read and understand education materials at Ashley Briggs high school level or above? {Blank single:19197::No,Yes}    Patient's primary language is  {Blank single:19197::English,Spanish,***}     Is the patient high risk? {sschighriskpts:78327}    SOCIAL DETERMINANTS OF HEALTH     At the [facility], we have learned that life circumstances - like trouble affording food, housing, utilities, or transportation can affect the health of many of our patients.   That is why we wanted to ask: are you currently experiencing any life circumstances that are negatively impacting your health and/or quality of life? {YES/NO/PATIENTDECLINED:93004}    Social Determinants of Health     Financial Resource Strain: Medium Risk (01/13/2022)  Overall Financial Resource Strain (CARDIA)     Difficulty of Paying Living Expenses: Somewhat hard   Internet Connectivity: Not on file   Food Insecurity: Food Insecurity Present (01/13/2022)    Hunger Vital Sign     Worried About Running Out of Food in the Last Year: Sometimes true     Ran Out of Food in the Last Year: Sometimes true   Tobacco Use: Medium Risk (07/01/2022)    Patient History     Smoking Tobacco Use: Never     Smokeless Tobacco Use: Never     Passive Exposure: Current   Housing/Utilities: Low Risk  (01/13/2022)    Housing/Utilities     Within the past 12 months, have you ever stayed: outside, in Ashley Briggs car, in Ashley Briggs tent, in an overnight shelter, or temporarily in someone else's home (i.e. couch-surfing)?: No     Are you worried about losing your housing?: No     Within the past 12 months, have you been unable to get utilities (heat, electricity) when it was really needed?: No   Alcohol Use: Not At Risk (01/09/2021)    Alcohol Use     How often do you have Ashley Briggs drink containing alcohol?: Monthly or less     How many drinks containing alcohol do you have on Ashley Briggs typical day when you are drinking?: 1 - 2     How often do you have 5 or more drinks on one occasion?: Never   Transportation Needs: No Transportation Needs (01/13/2022)    PRAPARE - Transportation     Lack of Transportation (Medical): No     Lack of Transportation (Non-Medical): No   Substance Use: Low Risk  (01/09/2021)    Substance Use     Taken prescription drugs for non-medical reasons: Never     Taken illegal drugs: Never     Patient indicated they have taken drugs in the past year for non-medical reasons: Yes, [positive answer(s)]: Not on file   Health Literacy: Low Risk  (01/09/2021)    Health Literacy     : Never   Physical Activity: Insufficiently Active (01/09/2021)    Exercise Vital Sign     Days of Exercise per Week: 1 day     Minutes of Exercise per Session: 40 min   Interpersonal Safety: Not on file   Stress: Not on file   Intimate Partner Violence: Not At Risk (01/09/2021)    Humiliation, Afraid, Rape, and Kick questionnaire     Fear of Current or Ex-Partner: No     Emotionally Abused: No     Physically Abused: No     Sexually Abused: No   Depression: Not at risk (07/12/2021)    PHQ-2     PHQ-2 Score: 0   Social Connections: Not on file       Would you be willing to receive help with any of the needs that you have identified today? {Yes/No/Not applicable:93005}       Ashley Briggs Ashley Briggs Ashley Briggs Shared [facility] Specialty Pharmacist

## 2022-07-08 ENCOUNTER — Ambulatory Visit: Payer: Medicaid Other | Attending: Orthopedic Surgery | Admitting: Physical Therapy

## 2022-07-08 ENCOUNTER — Encounter: Payer: Self-pay | Admitting: Physical Therapy

## 2022-07-08 DIAGNOSIS — M5412 Radiculopathy, cervical region: Secondary | ICD-10-CM | POA: Insufficient documentation

## 2022-07-08 DIAGNOSIS — M25612 Stiffness of left shoulder, not elsewhere classified: Secondary | ICD-10-CM | POA: Insufficient documentation

## 2022-07-08 DIAGNOSIS — G8929 Other chronic pain: Secondary | ICD-10-CM | POA: Insufficient documentation

## 2022-07-08 DIAGNOSIS — M25512 Pain in left shoulder: Secondary | ICD-10-CM | POA: Diagnosis present

## 2022-07-08 DIAGNOSIS — M6281 Muscle weakness (generalized): Secondary | ICD-10-CM | POA: Insufficient documentation

## 2022-07-08 NOTE — Therapy (Signed)
OUTPATIENT PHYSICAL THERAPY EVALUATION   Patient Name: Abigail Molina MRN: [id number] DOB:1966-05-02, 56 y.o., female Today's Date: 07/08/2022   PT End of Session - 07/08/22 2012     Visit Number 1    Number of Visits 24    Date for PT Re-Evaluation 09/30/22    Authorization Type [place] [employer]ICA[employer] reporting period from 07/08/2022    Authorization Time Period max allowed 27 combined PT/OT/SLP, no auth required    Authorization - Visit Number 1    Authorization - Number of Visits 27    Progress Note Due on Visit 10    PT Start Time 1350    PT Stop Time 1430    PT Time Calculation (min) 40 min    Activity Tolerance Patient tolerated treatment well    Behavior During Therapy WFL for tasks assessed/performed             Past Medical History:  Diagnosis Date   Cancer ([place])    Endometrial cancer   Cirrhosis of liver ([place])    Depression    Diabetes mellitus without complication ([place])    Fatty liver disease, nonalcoholic 3532   GERD (gastroesophageal reflux disease)    Hyperlipidemia    Hypertension    Overactive bladder    Psoriasis 2015   Past Surgical History:  Procedure Laterality Date   ABDOMINAL HYSTERECTOMY  2007   CHOLECYSTECTOMY  2000   Patient Active Problem List   Diagnosis Date Noted   Abdominal pain 03/04/2018   Diabetes ([place]) 12/12/2015   Hyperlipidemia 12/12/2015   Hypertension 05/23/2015   Acid reflux 05/23/2015    PCP: [facility]  REFERRING PROVIDER: Edgar Desanctis, MD  REFERRING DIAG:  adhesive capsulitis of left shoulder, left shoulder pain, unspecified chronicity  THERAPY DIAG:  Chronic left shoulder pain  Stiffness of left shoulder, not elsewhere classified  Muscle weakness (generalized)  Radiculopathy, cervical region  Rationale for Evaluation and Treatment: Rehabilitation  ONSET DATE: February 2023  SUBJECTIVE:                                                                                                                                                                                       SUBJECTIVE STATEMENT: Patient reports she started having left shoulder pain about 6 months ago. Onset was gradual. She is also is having some numbness and tingling in her left arm that started about 1.5-2 months ago. She thinks this is a separate issue and she is being referred to a spine specialist for that. Paresthesia started left 4th and 5th finger digits and has spread to the entire left hand. It has gotten more and  more frequent and she deals with it 2-3 times a day now. She had a cortisone injection in her left shoulder on 05/02/2022 which helped her pain a lot. She is also having a lot of trouble moving her left shoulder and she is unsure if the shot helped with that. She feels that she is "overcautious" with that. She states she broke a bone up in her shoulder several years ago when her dog pulled her down. She thinks this was in 2021. She states her doctors felt this healed well. 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 Her right UE is doing fine. She denies neck pain. Before 6 months ago she had not problems moving her left UE. She is right handed. She states her her L UE feels really weak from the shoulder down. 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 Her doctors have told her the damage cannot be reversed but she can keep it from getting worse. She is on beta blockers for the varices.   PERTINENT HISTORY: Patient is a 55 y.o. female who presents to outpatient physical therapy with a referral for medical diagnosis adhesive capsulitis of left shoulder, left shoulder pain, unspecified chronicity. This patient's chief complaints consist of left shoulder pain, stiffness, and dysfunction leading to the following functional deficits: difficulty with anything requiring use of L UE such as lifting things, reaching, washing behind back, driving, sleeping.  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  Patient denies hx of stroke, seizures, lung problems, heart problems, unexplained changes in bowel or bladder problems, osteoporosis, and spinal surgery. Endorses history of unexplained weight loss, and stumbles and drops things all the time.   PAIN:  Are you having pain? Yes: NPRS scale for left shoulder: Current: 0/10,  Best: 0/10, Worst: 9/10. Pain location: anterior to superior L shoulder Pain description: sharp pain to a dull achy Aggravating factors: trying to use the left UE, lifting left arm straight up or moving it behind her back.  Relieving factors: cortisone shot, tylenol, not moving it  Also reports current B knee pain of 9/10 on NPRS  FUNCTIONAL LIMITATIONS: difficulty with anything requiring use of L UE such as lifting things, reaching, washing behind back, driving, sleeping.   PRECAUTIONS: None  WEIGHT BEARING RESTRICTIONS No  FALLS:  Has patient fallen in last 6 months? No She is worried about falling. She feels unsteady on her feet. She almost feel the other day and was going out the door and almost feel down the stairs. She is not sure what happened.   LIVING ENVIRONMENT: Lives with: lives with her cousin Lives in: Mobile home Stairs: Yes: Internal: 1 steps; none and External: 5-6 steps; on right going up, on left going up, and can reach both Has following  equipment at home: None She has used a SPC in the past and that helps her when her knees feel worse.   OCCUPATION: Disability  LEISURE: watch TV, interact with dog (  chocolate lab, walks her dog).   PLOF: Independent, walked with SPC occasionally.   PATIENT GOALS "to get better use of arm and hopefully the pain will go away"  OBJECTIVE  DIAGNOSTIC FINDINGS:  L shoulder Radiograph report from 05/16/2022:  EXAM: XR SHOULDER 3 OR MORE VIEWS LEFT  DATE: 05/16/2022 10:05 AM  ACCESSION: [id number]  DICTATED: 05/16/2022 10:32 AM  INTERPRETATION LOCATION: [place]   CLINICAL INDICATION: 56 years old Female with L shoulder pain  - M25.512 - Left shoulder pain, unspecified chronicity     COMPARISON: Radiograph dated 02/05/2021   TECHNIQUE: AP, Grashey, outlet, and axillary views of the left shoulder.   FINDINGS:  Diffuse osteopenia.   No acute fractures. Chronic deformity of the proximal humerus. No malalignment.. Mild acromioclavicular joint osseous overgrowth. Glenohumeral joint space is normally aligned with small osteophytes. Partially imaged lungs are clear.   IMPRESSION:  Mild acromioclavicular and glenohumeral joint osteoarthrosis, similar to prior.  SELF- REPORTED FUNCTION FOTO score: 43/100 (shoulder questionnaire)  OBSERVATION/INSPECTION Posture Posture (seated): forward head, rounded shoulders, slumped in sitting.  Anthropometrics Tremor: moves hands a lot Body composition: abdominal obesity/varices Functional Mobility Bed mobility: supine <> sit mod I for increased effort.  Transfers: sit <> stand I Gait: grossly WFL for household and short community ambulation. More detailed gait analysis deferred to later date as needed.   NEUROLOGICAL Dermatomes C2-T1 appears equal and intact to light touch except the following: C8 diminished to light touch on left compared to right.   SPINE SCREEN  CERVICAL SPINE AROM *Indicates pain Flexion: 20 pain in back of neck and  concordant pulling in left shoulder Extension: 58 concordant pulling in left shoulder Side Flexion:   R 25 pain at left neck, not shoulder, OP pain in left neck  L 25 OP pain in right neck Rotation:  R 58 pulling at back of head and sides of neck, OP pain in neck.  L 56 pulling at back of head and sides of neck, OP pain in left neck Left arm numb following ROM and OP exam.   CERVICAL SPINE REPEATED MOTIONS Seated cervical retraction with clinician OP: 1x10 pulling at left base of neck at last three reps, L UE numbness gradually improving, lingering left UT pain after.   CERVICAL SPINE SPECIAL TESTS Cervical spine axial compression: pain in left UT Spurling's part B:  R = pain in left UT and over left shoulder, L = pain in neck Cervical spine axial distraction: no pain but no improvement in paresthesia.    PERIPHERAL JOINT MOTION (in degrees)  ACTIVE RANGE OF MOTION (AROM) *Indicates pain 07/08/22 Date Date  Joint/Motion R/L R/L R/L  Shoulder     Flexion 155/115* / /  Extension 57/45* / /  Abduction  157/89* / /  External rotation 95/54* / /  Internal rotation T12/SIJ* / /  Comments:  07/08/2022: B elbows, wrists, and hands grossly WFL. Compensatory shoulder hike with L overhead motions.   PASSIVE RANGE OF MOTION (PROM) Grossly assessed: R shoulder WNL, L restricted to approx 160 degrees and ER 60 degrees.   MUSCLE PERFORMANCE (MMT):  *Indicates pain 07/08/22 Date Date  Joint/Motion R/L R/L R/L  Shoulder     Flexion 5/4+ / /  Abduction (C5) 5/4* / /  External rotation 4/3 / /  Internal rotation 5/5 / /  Extension / / /  Elbow     Flexion (C6) 5/4+ / /  Extension (C7) 4+/4- / /  Wrist  Flexion (C7) 4+/4 / /  Hand     Thumb extension (C8) 4/4 / /  Finger abduction (T1) B WFL / /  Comments: Paresthesia continues to improve.   SPECIAL TESTS:  SHOULDER SPECIAL TESTS Painful arc test: R = negative, L = positive. Drop arm test: R = negative, L =  negative. Hawkins-Kennedy test: R = positive, L = positive. Infraspinatus test: R = negative, L = postive.  ACCESSORY MOTION: L GH joint hypomobility in all directions .   TODAY'S TREATMENT  education  PATIENT EDUCATION: Self management techniques. Education on diagnosis, prognosis, POC, anatomy and physiology of current condition Person educated: Patient Education method: Explanation Education comprehension: verbalized understanding and needs further education   HOME EXERCISE PROGRAM: TBD  ASSESSMENT:  CLINICAL IMPRESSION: Patient is a 56 y.o. female referred to outpatient physical therapy with a medical diagnosis of adhesive capsulitis of left shoulder, left shoulder pain, unspecified chronicity who presents with signs and symptoms consistent with chronic L shoulder pain and stiffness and cervical radiculopathy affecting L UE especially in the C8 nerve root. Patient presents with positive special tests for rotator cuff related pain in the left shoulder. She appears to have radicular symptoms as well as shoulder pain and dysfunction. Limited ROM without severe pain and guarding suggest late phases of adhesive capsulitis, which often responds to PT better than early stages and improves her prognosis with PT. Patient presents with significant pain, ROM, joint stiffness, muscle tension, motor control, muscle performance (strength/power/endurance) and activity tolerance impairments that are limiting ability to complete anything requiring use of L UE such as lifting things, reaching, washing behind back, driving, sleeping without difficulty. Patient will benefit from skilled physical therapy intervention to address current body structure impairments and activity limitations to improve function and work towards goals set in current POC in order to return to prior level of function or maximal functional improvement.   OBJECTIVE IMPAIRMENTS decreased activity tolerance, decreased balance,  decreased endurance, decreased knowledge of condition, decreased ROM, decreased strength, hypomobility, impaired perceived functional ability, increased muscle spasms, impaired flexibility, impaired UE functional use, improper body mechanics, obesity, and pain.   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   REHAB POTENTIAL: Good  CLINICAL DECISION MAKING: Stable/uncomplicated  EVALUATION COMPLEXITY: Low   GOALS: Goals reviewed with patient? No  SHORT TERM GOALS: Target date: 07/22/2022  Patient will be independent with initial home exercise program for self-management of symptoms. Baseline: Initial HEP to be provided at visit 2 as appropriate (07/08/22); Goal status: INITIAL   LONG TERM GOALS: Target date: 09/30/2022  Patient will be independent with a long-term home exercise program for self-management of symptoms.  Baseline: Initial  HEP to be provided at visit 2 as  appropriate (07/08/22); Goal status: INITIAL  2.  Patient will demonstrate improved FOTO to equal or greater than 52 by visit #9 to demonstrate improvement in overall condition and self-reported functional ability.  Baseline: 43 (07/08/22); Goal status: INITIAL  3.  Patient will demonstrate left shoulder AROM equal or greater than right shoulder AROM to be able to reach overhead and complete ADLs such as bathing with less difficulty.  Baseline: significantly limited - see objective exam  (07/08/22); Goal status: INITIAL  4.  Patient will demonstrate L shoulder strength equal or greater than R shoulder strength with no increase in pain beyond 1/10 NPRS to demonstrate improved ability to push, pull, carry, and use L UE for functional tasks such as lifting and driving.  Baseline: significantly limited and painful (07/08/22); Goal status: INITIAL  5.  Patient will complete community, work and/or recreational activities without limitation due to current condition.  Baseline: difficulty with anything requiring use of L UE such as lifting things, reaching, washing behind back, driving, sleeping (07/62/26); Goal status: INITIAL  PLAN: PT FREQUENCY: 1-2x/week  PT DURATION: 12 weeks  PLANNED INTERVENTIONS: Therapeutic exercises, Therapeutic activity, Neuromuscular re-education, Balance training, Patient/Family education, Self Care, Joint mobilization, Dry Needling, Electrical stimulation, Spinal mobilization, Cryotherapy, Moist heat, Manual therapy, and Re-evaluation  PLAN FOR NEXT SESSION: update HEP as appropriate, manual therapy and exercises for improved ROM, shoulder girdle and postural strength as appropriate.    Everlean Alstrom. Graylon Good, PT, DPT 07/08/22, 8:34 PM  [facility] [address] [place], [place] [address] P: [phone] I F: [phone]

## 2022-07-10 MED FILL — INSULIN GLARGINE (U-100) 100 UNIT/ML (3 ML) SUBCUTANEOUS PEN: SUBCUTANEOUS | 22 days supply | Qty: 15 | Fill #1

## 2022-07-15 ENCOUNTER — Ambulatory Visit: Payer: Medicaid Other | Admitting: Physical Therapy

## 2022-07-15 ENCOUNTER — Encounter: Payer: Self-pay | Admitting: Physical Therapy

## 2022-07-15 DIAGNOSIS — M5412 Radiculopathy, cervical region: Secondary | ICD-10-CM

## 2022-07-15 DIAGNOSIS — G8929 Other chronic pain: Secondary | ICD-10-CM

## 2022-07-15 DIAGNOSIS — M25612 Stiffness of left shoulder, not elsewhere classified: Secondary | ICD-10-CM

## 2022-07-15 DIAGNOSIS — M25512 Pain in left shoulder: Secondary | ICD-10-CM | POA: Diagnosis not present

## 2022-07-15 DIAGNOSIS — M6281 Muscle weakness (generalized): Secondary | ICD-10-CM

## 2022-07-15 NOTE — Therapy (Signed)
OUTPATIENT PHYSICAL THERAPY TREATMENT NOTE   Patient Name: Abigail Molina MRN: [id number] DOB:1966/01/05, 56 y.o., female Today's Date: 07/15/2022  PCP: [facility] REFERRING PROVIDER:  Desanctis, MD  END OF SESSION:   PT End of Session - 07/15/22 1311     Visit Number 2    Number of Visits 24    Date for PT Re-Evaluation 09/30/22    Authorization Type [place] [employer]ICA[employer] reporting period from 07/08/2022    Authorization Time Period max allowed 27 combined PT/OT/SLP, no auth required    Authorization - Visit Number 2    Authorization - Number of Visits 27    Progress Note Due on Visit 10    PT Start Time 1305    PT Stop Time 1343    PT Time Calculation (min) 38 min    Activity Tolerance Patient tolerated treatment well    Behavior During Therapy WFL for tasks assessed/performed             Past Medical History:  Diagnosis Date   Cancer ([place])    Endometrial cancer   Cirrhosis of liver ([place])    Depression    Diabetes mellitus without complication ([place])    Fatty liver disease, nonalcoholic 4098   GERD (gastroesophageal reflux disease)    Hyperlipidemia    Hypertension    Overactive bladder    Psoriasis 2015   Past Surgical History:  Procedure Laterality Date   ABDOMINAL HYSTERECTOMY  2007   CHOLECYSTECTOMY  2000   Patient Active Problem List   Diagnosis Date Noted   Abdominal pain 03/04/2018   Diabetes ([place]) 12/12/2015   Hyperlipidemia 12/12/2015   Hypertension 05/23/2015   Acid reflux 05/23/2015    REFERRING DIAG: adhesive capsulitis of left shoulder, left shoulder pain, unspecified chronicity  THERAPY DIAG:  Chronic left shoulder pain  Stiffness of left shoulder, not elsewhere classified  Muscle weakness (generalized)  Radiculopathy, cervical region  Rationale for Evaluation and Treatment: Rehabilitation  PERTINENT HISTORY: Patient is a 56 y.o. female who presents to outpatient physical therapy with a referral  for medical diagnosis adhesive capsulitis of left shoulder, left shoulder pain, unspecified chronicity. This patient's chief complaints consist of left shoulder pain, stiffness, and dysfunction leading to the following functional deficits: difficulty with anything requiring use of L UE such as lifting things, reaching, washing behind back, driving, sleeping. 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  Patient denies hx of stroke, seizures, lung problems, heart problems, unexplained changes in bowel or bladder problems, osteoporosis, and spinal surgery. Endorses history of unexplained weight loss, and stumbles and drops things all the time.   PRECAUTIONS: none  SUBJECTIVE: Patient reports she didn't feel too bad after last PT session but as the week went on her L shoulder continued to hurt more. She also has the same amount of paresthesia in that arm.   PAIN:  Are you having pain? Yes 3/10 at anterior left shoulder.   OBJECTIVE:    TODAY'S TREATMENT  Therapeutic exercise:  to centralize symptoms and improve ROM, strength, muscular endurance, and activity tolerance required for successful completion of functional activities.  - seated AAROM L shoulder flexion and abduction with pulleys, 1x2 min each direction, 5 second holds. (Reports increased paresthesia during flexion, then abduction especially to the 4th and 5th digits of left  hand).  - seated cervical retraction, 3x10 (slight pain at base of neck upon release).  - seated B shoulder ER with yellow theraband, 3x10 with 5 second hold.  - seated rows at [facility] machine, 3x10 at 5# (10# too difficult). VC/TC for improved posture and scapular motion.  - seated B chest press press at [place], attempted 1 rep at 5# but too painful and difficult, so discontinued.  - hooklying chest press with plus, 3#DB in each hand, 1x3 discontinued due to pain with plus on L shoulder and entire L UE going numb. No reduction in pain with AROM compared to resisted motion.   Manual therapy: to reduce pain and tissue tension, improve range of motion, neuromodulation, in order to promote improved ability to complete functional activities. SUPINE - STM to posterior cervical spine musculature, L upper trap, B SCM.  - CPA grade III to upper thoracic spine and throughout cervical spine (painful at low Cspine and upper Tspine).  - intermittent manual cervical spine distraction  Pt required multimodal cuing for proper technique and to facilitate improved neuromuscular control, strength, range of motion, and functional ability resulting in improved performance and form.   PATIENT EDUCATION: Exercise purpose/form. Self management techniques. Education on HEP including handout.  Reviewed cancelation/no-show policy with patient and confirmed patient has correct phone number for clinic; patient verbalized understanding (07/15/22). Person educated: Patient Education method: Explanation, demonstration, verbal and tactile cuing.  Education comprehension: verbalized understanding, demonstrated understanding and needs further education   HOME EXERCISE PROGRAM: Access Code: [email or web address] URL: [email or web address] Date: 07/15/2022 Prepared by: Rosita Kea  Exercises - Seated Shoulder Flexion AAROM with Pulley Behind  - 1 x daily - 1 sets - 20 reps - 5 seconds hold - Seated Shoulder  Abduction AAROM with Pulley Behind  - 1 x daily - 1 sets - 20 reps - 5 seconds hold - Seated Cervical Retraction  - 1 x daily - 3 sets - 10 reps - 5 seconds hold - Shoulder External Rotation and Scapular Retraction with Resistance  - 1 x daily - 3 sets - 10 reps   ASSESSMENT:   CLINICAL IMPRESSION: Patient tolerated treatment well overall with some difficulty due to L anterior shoulder pain and L UE paresthesia in the 4th and 5th digits. She continued to have paresthesia symptoms with L shoulder elevation that calms down when her arm is by her side. Initiated HEP with postural and shoulder strengthening. Patient would benefit from continued management of limiting condition by skilled physical therapist to address remaining impairments and functional limitations to work towards stated goals and return to PLOF or maximal functional independence.   From initial PT eval on 07/08/2022:  Patient is a 56 y.o. female referred to outpatient physical therapy with a medical diagnosis of adhesive capsulitis of left shoulder, left shoulder pain, unspecified chronicity who presents with signs and symptoms consistent with chronic L shoulder pain and stiffness and cervical radiculopathy affecting L UE especially in the C8 nerve root. Patient presents with positive special tests for rotator cuff related pain in the left shoulder. She appears to have radicular symptoms as well as shoulder pain and dysfunction. Limited ROM without severe pain and guarding  suggest late phases of adhesive capsulitis, which often responds to PT better than early stages and improves her prognosis with PT. Patient presents with significant pain, ROM, joint stiffness, muscle tension, motor control, muscle performance (strength/power/endurance) and activity tolerance impairments that are limiting ability to complete anything requiring use of L UE such as lifting things, reaching, washing behind back, driving, sleeping without difficulty. Patient will  benefit from skilled physical therapy intervention to address current body structure impairments and activity limitations to improve function and work towards goals set in current POC in order to return to prior level of function or maximal functional improvement.    OBJECTIVE IMPAIRMENTS decreased activity tolerance, decreased balance, decreased endurance, decreased knowledge of condition, decreased ROM, decreased strength, hypomobility, impaired perceived functional ability, increased muscle spasms, impaired flexibility, impaired UE functional use, improper body mechanics, obesity, and pain.    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    REHAB POTENTIAL: Good   CLINICAL DECISION MAKING: Stable/uncomplicated   EVALUATION COMPLEXITY: Low     GOALS: Goals reviewed with patient? No   SHORT TERM GOALS: Target date: 07/22/2022   Patient will be independent with initial home exercise  program for self-management of symptoms. Baseline: Initial HEP to be provided at visit 2 as appropriate (07/08/22); Goal status: INITIAL     LONG TERM GOALS: Target date: 09/30/2022   Patient will be independent with a long-term home exercise program for self-management of symptoms.  Baseline: Initial HEP to be provided at visit 2 as appropriate (07/08/22); Goal status: INITIAL   2.  Patient will demonstrate improved FOTO to equal or greater than 52 by visit #9 to demonstrate improvement in overall condition and self-reported functional ability.  Baseline: 43 (07/08/22); Goal status: INITIAL   3.  Patient will demonstrate left shoulder AROM equal or greater than right shoulder AROM to be able to reach overhead and complete ADLs such as bathing with less difficulty.  Baseline: significantly limited - see objective exam  (07/08/22); Goal status: INITIAL   4.  Patient will demonstrate L shoulder strength equal or greater than R shoulder strength with no increase in pain beyond 1/10 NPRS to demonstrate improved ability to push, pull, carry, and use L UE for functional tasks such as lifting and driving.  Baseline: significantly limited and painful (07/08/22); Goal status: INITIAL   5.  Patient will complete community, work and/or recreational activities without limitation due to current condition.  Baseline: difficulty with anything requiring use of L UE such as lifting things, reaching, washing behind back, driving, sleeping (97/35/32); Goal status: INITIAL   PLAN: PT FREQUENCY: 1-2x/week   PT DURATION: 12 weeks   PLANNED  INTERVENTIONS: Therapeutic exercises, Therapeutic activity, Neuromuscular re-education, Balance training, Patient/Family education, Self Care, Joint mobilization, Dry Needling, Electrical stimulation, Spinal mobilization, Cryotherapy, Moist heat, Manual therapy, and Re-evaluation   PLAN FOR NEXT SESSION: update HEP as appropriate, manual therapy and exercises for  improved ROM, shoulder girdle and postural strength as appropriate.     Nancy Nordmann, PT, DPT 07/15/2022, 5:18 PM  [facility] [address] [place], [place] [address] P: [phone] I F: [phone]

## 2022-07-20 ENCOUNTER — Ambulatory Visit
Admit: 2022-07-20 | Discharge: 2022-07-21 | Disposition: A | Discharge status: Home / Self Care | Payer: PRIVATE HEALTH INSURANCE

## 2022-07-20 DIAGNOSIS — M1990 Unspecified osteoarthritis, unspecified site: Principal | ICD-10-CM

## 2022-07-20 LAB — CBC W/ AUTO DIFF
BASOPHILS ABSOLUTE COUNT: 0 10*9/L (ref 0.0–0.1)
BASOPHILS RELATIVE PERCENT: 0.4 %
EOSINOPHILS ABSOLUTE COUNT: 0.1 10*9/L (ref 0.0–0.5)
EOSINOPHILS RELATIVE PERCENT: 1.8 %
HEMATOCRIT: 34.9 % (ref 34.0–44.0)
HEMOGLOBIN: 11.4 g/dL (ref 11.3–14.9)
LYMPHOCYTES ABSOLUTE COUNT: 1 10*9/L — ABNORMAL LOW (ref 1.1–3.6)
LYMPHOCYTES RELATIVE PERCENT: 13.1 %
MEAN CORPUSCULAR HEMOGLOBIN CONC: 32.7 g/dL (ref 32.0–36.0)
MEAN CORPUSCULAR HEMOGLOBIN: 26.6 pg (ref 25.9–32.4)
MEAN CORPUSCULAR VOLUME: 81.5 fL (ref 77.6–95.7)
MEAN PLATELET VOLUME: 8.2 fL (ref 6.8–10.7)
MONOCYTES ABSOLUTE COUNT: 0.7 10*9/L (ref 0.3–0.8)
MONOCYTES RELATIVE PERCENT: 9 %
NEUTROPHILS ABSOLUTE COUNT: 5.5 10*9/L (ref 1.8–7.8)
NEUTROPHILS RELATIVE PERCENT: 75.7 %
NUCLEATED RED BLOOD CELLS: 0 /100{WBCs} (ref <=4)
PLATELET COUNT: 114 10*9/L — ABNORMAL LOW (ref 150–450)
RED BLOOD CELL COUNT: 4.29 10*12/L (ref 3.95–5.13)
RED CELL DISTRIBUTION WIDTH: 15 % (ref 12.2–15.2)
WBC ADJUSTED: 7.3 10*9/L (ref 3.6–11.2)

## 2022-07-20 LAB — C-REACTIVE PROTEIN: C-REACTIVE PROTEIN: 10 mg/L (ref <=10.0)

## 2022-07-20 LAB — BASIC METABOLIC PANEL
ANION GAP: 11 mmol/L (ref 5–14)
BLOOD UREA NITROGEN: 9 mg/dL (ref 9–23)
BUN / CREAT RATIO: 18
CALCIUM: 9.4 mg/dL (ref 8.7–10.4)
CHLORIDE: 106 mmol/L (ref 98–107)
CO2: 19.7 mmol/L — ABNORMAL LOW (ref 20.0–31.0)
CREATININE: 0.49 mg/dL — ABNORMAL LOW
EGFR CKD-EPI (2021) FEMALE: >90 mL/min/{1.73_m2} (ref >=60)
GLUCOSE RANDOM: 233 mg/dL — ABNORMAL HIGH (ref 70–179)
POTASSIUM: 4 mmol/L (ref 3.4–4.8)
SODIUM: 137 mmol/L (ref 135–145)

## 2022-07-20 LAB — SEDIMENTATION RATE: ERYTHROCYTE SEDIMENTATION RATE: 53 mm/h — ABNORMAL HIGH (ref 0–30)

## 2022-07-20 MED ORDER — PREDNISONE 10 MG TABLET
ORAL_TABLET | ORAL | 0 refills | 15 days | Status: CP
Start: 2022-07-20 — End: 2022-08-04
  Filled 2022-07-29: qty 44, 21d supply, fill #0

## 2022-07-21 LAB — RHEUMATOID FACTOR, QUANT: RHEUMATOID FACTOR: <3.5 [IU]/mL (ref <14.0)

## 2022-07-21 MED ADMIN — HYDROcodone-acetaminophen (NORCO) 5-325 mg per tablet 2 tablet: 2 | ORAL | @ 04:00:00 | Stop: 2022-07-20

## 2022-07-21 MED ADMIN — predniSONE (DELTASONE) tablet 40 mg: 40 mg | ORAL | @ 01:00:00 | Stop: 2022-07-20

## 2022-07-21 NOTE — Unmapped (Signed)
Attending Note    This is a 56 y.o. with a past medical history of psoriasis (on Humira) and cirrhosis presenting to the ED with joint pain. The patient reports acute onset of diffuse joint pain that originated in her neck and spread to all of her other joints that worsens with any movement. She additionally reprots nausea but no emesis and intermittently feeling like her tongue is burnt. She endorses chills (Tmax of 100.8 F on arrival) but denies cough or congestion. She additionally reports dizziness. Denies joint swelling. She has taken tylenol with no symptomatic relief. She is unable to take ibuprofen due to her known cirrhosis of the liver.        ROS:  As per HPI    Physical Exam:  Gen: Well appearing, NAD  Chest: CTA bilaterally, no w/r/r  Cardiac: nl S1S2 s m/g/r  Abd: soft, nt  Skin: no rashes, no ecchymoses  Neuro: normal gait, speech, and balance  MSK: neck supple. Diffuse tenderness to large and small joints but no erythema or edema to the joints.  Psych: normal mood, thought, and affect      Assessment/Plan:  56 year old with psoriasis presents to the emergency room with an inflammatory arthritis.  Patient has no leukocytosis chemistries are unremarkable.  She does have an elevated sed rate.  I am concerned that this is psoriatic arthritis.    Patient was given dose of prednisone in the emergency room.    She will be treated with prednisone, follow-up with primary care, return for new symptoms, worsening pain or other concerns.         Documentation assistance was provided by Desma Paganini, Scribe, on July 20, 2022 at 8:41 PM for Betsy Pries, MD.      Documentation assistance was provided by the scribe in my presence.  The documentation recorded by the scribe has been reviewed by me and accurately reflects the services I personally performed.         Chauncey Reading, MD  07/20/22 [phone]

## 2022-07-21 NOTE — Unmapped (Signed)
Feels feverish. States every joint in her body hurts, started in neck this am. Denies recent trauma or tick bites. Nausea. No cough

## 2022-07-22 ENCOUNTER — Ambulatory Visit: Payer: Medicaid Other | Admitting: Physical Therapy

## 2022-07-22 DIAGNOSIS — Z794 Long term (current) use of insulin: Principal | ICD-10-CM

## 2022-07-22 DIAGNOSIS — E1169 Type 2 diabetes mellitus with other specified complication: Principal | ICD-10-CM

## 2022-07-22 MED ORDER — BLOOD-GLUCOSE METER
0 refills | 0 days
Start: 2022-07-22 — End: 2023-07-22

## 2022-07-22 MED ORDER — ASPIRIN 81 MG TABLET,DELAYED RELEASE
ORAL_TABLET | Freq: Every day | ORAL | 3 refills | 90 days
Start: 2022-07-22 — End: ?

## 2022-07-23 MED FILL — CELECOXIB 200 MG CAPSULE: ORAL | 30 days supply | Qty: 60 | Fill #3

## 2022-07-23 NOTE — Unmapped (Signed)
Patient is requesting the following refill  Requested Prescriptions     Pending Prescriptions Disp Refills    blood-glucose meter (ON CALL EXPRESS METER) Misc 1 each 0     Sig: Use as directed by provider.    aspirin (ECOTRIN) 81 MG tablet 90 tablet 3     Sig: Take 1 tablet (81 mg total) by mouth daily.       Recent Visits  Date Type Provider Dept   03/06/22 Office Visit Desmond Dike, NP [facility] [address] [facility]   02/11/22 Office Visit Desmond Dike, NP [facility]   01/13/22 Office Visit Desmond Dike, NP [facility]   09/12/21 Office Visit Desmond Dike, NP [facility]   Showing recent visits within past 365 days with a meds authorizing provider and meeting all other requirements  Future Appointments  No visits were found meeting these conditions.  Showing future appointments within next 365 days with a meds authorizing provider and meeting all other requirements       Labs: A1c:   Hemoglobin A1C (%)   Date Value   01/13/2022 6.4 (A)

## 2022-07-24 ENCOUNTER — Ambulatory Visit: Payer: Medicaid Other | Admitting: Physical Therapy

## 2022-07-27 MED ORDER — BLOOD-GLUCOSE METER KIT WRAPPER
0 refills | 0 days | Status: CP
Start: 2022-07-27 — End: 2023-07-27
  Filled 2022-07-29: qty 1, 34d supply, fill #0

## 2022-07-27 MED ORDER — ASPIRIN 81 MG TABLET,DELAYED RELEASE
ORAL_TABLET | Freq: Every day | ORAL | 3 refills | 90 days | Status: CP
Start: 2022-07-27 — End: ?
  Filled 2022-07-29: qty 90, 90d supply, fill #0

## 2022-07-29 ENCOUNTER — Encounter: Payer: Self-pay | Admitting: Physical Therapy

## 2022-07-29 ENCOUNTER — Ambulatory Visit: Payer: Medicaid Other | Admitting: Physical Therapy

## 2022-07-29 DIAGNOSIS — M25612 Stiffness of left shoulder, not elsewhere classified: Secondary | ICD-10-CM

## 2022-07-29 DIAGNOSIS — G8929 Other chronic pain: Secondary | ICD-10-CM

## 2022-07-29 DIAGNOSIS — M6281 Muscle weakness (generalized): Secondary | ICD-10-CM

## 2022-07-29 DIAGNOSIS — M25512 Pain in left shoulder: Secondary | ICD-10-CM | POA: Diagnosis not present

## 2022-07-29 MED FILL — TRULICITY 1.5 MG/0.5 ML SUBCUTANEOUS PEN INJECTOR: SUBCUTANEOUS | 28 days supply | Qty: 4 | Fill #5

## 2022-07-29 NOTE — Therapy (Signed)
OUTPATIENT PHYSICAL THERAPY TREATMENT NOTE   Patient Name: Abigail Molina MRN: [id number] DOB:May 19, 1966, 56 y.o., female Today's Date: 07/29/2022  PCP: [facility] REFERRING PROVIDER: [place], MD  END OF SESSION:   PT End of Session - 07/29/22 1337     Visit Number 3    Number of Visits 24    Date for PT Re-Evaluation 09/30/22    Authorization Type [place] [employer]ICA[employer] reporting period from 07/08/2022    Authorization Time Period max allowed 27 combined PT/OT/SLP, no auth required    Authorization - Visit Number 3    Authorization - Number of Visits 27    Progress Note Due on Visit 10    PT Start Time 1638    PT Stop Time 1341    PT Time Calculation (min) 38 min    Activity Tolerance Patient tolerated treatment well    Behavior During Therapy WFL for tasks assessed/performed              Past Medical History:  Diagnosis Date   Cancer ([place])    Endometrial cancer   Cirrhosis of liver ([place])    Depression    Diabetes mellitus without complication ([place])    Fatty liver disease, nonalcoholic 4536   GERD (gastroesophageal reflux disease)    Hyperlipidemia    Hypertension    Overactive bladder    Psoriasis 2015   Past Surgical History:  Procedure Laterality Date   ABDOMINAL HYSTERECTOMY  2007   CHOLECYSTECTOMY  2000   Patient Active Problem List   Diagnosis Date Noted   Abdominal pain 03/04/2018   Diabetes ([place]) 12/12/2015   Hyperlipidemia 12/12/2015   Hypertension 05/23/2015   Acid reflux 05/23/2015    REFERRING DIAG: adhesive capsulitis of left shoulder, left shoulder pain, unspecified chronicity  THERAPY DIAG:  Chronic left shoulder pain  Stiffness of left shoulder, not elsewhere classified  Muscle weakness (generalized)  Rationale for Evaluation and Treatment: Rehabilitation  PERTINENT HISTORY: Patient is a 56 y.o. female who presents to outpatient physical therapy with a referral for medical diagnosis adhesive  capsulitis of left shoulder, left shoulder pain, unspecified chronicity. This patient's chief complaints consist of left shoulder pain, stiffness, and dysfunction leading to the following functional deficits: difficulty with anything requiring use of L UE such as lifting things, reaching, washing behind back, driving, sleeping. 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  Patient denies hx of stroke, seizures, lung problems, heart problems, unexplained changes in bowel or bladder problems, osteoporosis, and spinal surgery. Endorses history of unexplained weight loss, and stumbles and drops things all the time.   PRECAUTIONS: none  SUBJECTIVE: Patient reports she is "hanging in there." She had joint pain all over her body, fever, and nausea last week and ended up at the ER where the thought she had psoriatic arthritis. She was given some prednisone and oxycodone in the hospital that helped. There was a problem with the prescribed prednisone so she has not received it yet. She states the  ED doc recommended she see a rheumatologist but her PCP would not refer her there without a visit with PCP first who was not able to see her until next week. She missed her last PT session due to not having the money for the gas needed to drive to the clinic. She state she has not been doing her HEP as much as she should and her shoulder pain was pretty bad  last night.   PAIN:  Are you having pain? No pain upon arrival.   OBJECTIVE:   TODAY'S TREATMENT  Therapeutic exercise: to centralize symptoms and improve ROM, strength, muscular endurance, and activity tolerance required for successful completion of functional activities.  - seated AAROM L shoulder flexion and abduction with pulleys, 1x2 min each direction, 5 second holds. (Reports increased paresthesia during flexion, then abduction especially to the 4th and 5th digits of left hand). - seated cervical retraction, 3x10 with 5 second holds. Towel roll behind thoracic spine.  - seated B shoulder ER with RTB, towel roll behind thoracic spine, 3x10 with 5 second hold.  - seated rows at [place], 1x30 at 5# (try progressing next session). VC/TC for improved posture and scapular motion.  - standing weight shift over arms of chair as if she is looking over the back of chair, 3x10. - standing double wall slide into scaption AAROM with side of hand on wall, 3x10.  Manual therapy: to reduce pain and tissue tension, improve range of motion, neuromodulation, in order to promote improved ability to complete functional activities. SUPINE - STM to posterior cervical spine musculature, L upper trap.   Pt required multimodal cuing for proper technique and to facilitate improved neuromuscular control, strength, range of motion, and functional ability resulting in improved performance and form.   PATIENT EDUCATION: Exercise purpose/form. Self management techniques. Education on HEP including handout.  Reviewed cancelation/no-show policy with patient and confirmed patient has correct phone number for clinic; patient verbalized understanding (07/15/22). Person educated: Patient Education method: Explanation, demonstration, verbal and tactile cuing.  Education comprehension: verbalized understanding, demonstrated understanding and needs further education   HOME EXERCISE PROGRAM: Access Code:  [email or web address] URL: [email or web address] Date: 07/15/2022 Prepared by: Rosita Kea  Exercises - Seated Shoulder Flexion AAROM with Pulley Behind  - 1 x daily - 1 sets - 20 reps - 5 seconds hold - Seated Shoulder Abduction AAROM with Pulley Behind  - 1 x daily - 1 sets - 20 reps - 5 seconds hold - Seated Cervical Retraction  - 1 x daily - 3 sets - 10 reps - 5 seconds hold - Shoulder External Rotation and Scapular Retraction with Resistance  - 1 x daily - 3 sets - 10 reps   ASSESSMENT:   CLINICAL IMPRESSION: Patient tolerated treatment well overall with some discomfort due to left UE paresthesia with overhead motions. Continued with shoulder girdle and postural strengthening and range of motion. Patient was able to progress to more overhead motions with less discomfort. Patient would benefit from continued management of limiting condition by skilled physical therapist to address remaining impairments and functional limitations to work towards stated goals and return to PLOF or maximal functional independence.   From initial PT eval on 07/08/2022:  Patient is a 56 y.o. female referred to outpatient physical therapy with a medical diagnosis of adhesive capsulitis of left shoulder, left shoulder pain, unspecified chronicity who presents with signs and symptoms consistent with chronic L shoulder pain and stiffness and cervical radiculopathy affecting L UE especially in the C8  nerve root. Patient presents with positive special tests for rotator cuff related pain in the left shoulder. She appears to have radicular symptoms as well as shoulder pain and dysfunction. Limited ROM without severe pain and guarding suggest late phases of adhesive capsulitis, which often responds to PT better than early stages and improves her prognosis with PT. Patient presents with significant pain, ROM, joint stiffness, muscle tension, motor control, muscle performance (strength/power/endurance) and activity tolerance  impairments that are limiting ability to complete anything requiring use of L UE such as lifting things, reaching, washing behind back, driving, sleeping without difficulty. Patient will benefit from skilled physical therapy intervention to address current body structure impairments and activity limitations to improve function and work towards goals set in current POC in order to return to prior level of function or maximal functional improvement.    OBJECTIVE IMPAIRMENTS decreased activity tolerance, decreased balance, decreased endurance, decreased knowledge of condition, decreased ROM, decreased strength, hypomobility, impaired perceived functional ability, increased muscle spasms, impaired flexibility, impaired UE functional use, improper body mechanics, obesity, and pain.    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    REHAB POTENTIAL: Good   CLINICAL DECISION MAKING:  Stable/uncomplicated   EVALUATION COMPLEXITY: Low     GOALS: Goals reviewed with patient? No   SHORT TERM GOALS: Target date: 07/22/2022   Patient will be independent with initial home exercise program for self-management of symptoms. Baseline: Initial HEP to be provided at visit 2 as appropriate (07/08/22); Goal status: INITIAL     LONG TERM GOALS: Target date: 09/30/2022   Patient will be independent with a long-term home exercise program for self-management of symptoms.  Baseline: Initial HEP to be provided at visit 2 as appropriate (07/08/22); Goal status: INITIAL   2.  Patient will demonstrate improved FOTO to equal or greater than 52 by visit #9 to demonstrate improvement in overall condition and self-reported functional ability.  Baseline: 43 (07/08/22); Goal status: INITIAL   3.  Patient will demonstrate left shoulder AROM equal or greater than right shoulder AROM to be able to reach overhead and complete ADLs such as bathing with less difficulty.  Baseline: significantly limited - see objective exam  (07/08/22); Goal status: INITIAL   4.  Patient will demonstrate L shoulder strength equal or greater than R shoulder strength with no increase in pain beyond 1/10 NPRS to demonstrate improved ability to push, pull, carry, and use L UE for functional tasks such as lifting and driving.  Baseline: significantly limited and painful (07/08/22); Goal status: INITIAL   5.  Patient will complete community, work and/or recreational activities without limitation due to current condition.  Baseline:  difficulty with anything requiring use of L UE such as lifting things, reaching, washing behind back, driving, sleeping (35/32/99); Goal status: INITIAL   PLAN: PT FREQUENCY: 1-2x/week   PT DURATION: 12 weeks   PLANNED INTERVENTIONS: Therapeutic exercises, Therapeutic activity, Neuromuscular re-education, Balance training, Patient/Family education, Self Care, Joint mobilization, Dry  Needling, Electrical stimulation, Spinal mobilization, Cryotherapy, Moist heat, Manual therapy, and Re-evaluation   PLAN FOR NEXT SESSION: update HEP as appropriate, manual therapy and exercises for improved ROM, shoulder girdle and postural strength as appropriate.     Nancy Nordmann, PT, DPT 07/29/2022, 3:01 PM  [facility] [address] [place], [place] [address] P: [phone] I F: [phone]

## 2022-07-31 ENCOUNTER — Encounter: Payer: Self-pay | Admitting: Physical Therapy

## 2022-07-31 ENCOUNTER — Ambulatory Visit: Payer: Medicaid Other | Admitting: Physical Therapy

## 2022-07-31 DIAGNOSIS — G8929 Other chronic pain: Secondary | ICD-10-CM

## 2022-07-31 DIAGNOSIS — M25612 Stiffness of left shoulder, not elsewhere classified: Secondary | ICD-10-CM

## 2022-07-31 DIAGNOSIS — M5412 Radiculopathy, cervical region: Secondary | ICD-10-CM

## 2022-07-31 DIAGNOSIS — M25512 Pain in left shoulder: Secondary | ICD-10-CM | POA: Diagnosis not present

## 2022-07-31 DIAGNOSIS — M6281 Muscle weakness (generalized): Secondary | ICD-10-CM

## 2022-07-31 NOTE — Therapy (Signed)
OUTPATIENT PHYSICAL THERAPY TREATMENT NOTE   Patient Name: Abigail Molina MRN: [id number] DOB:[birth year], 56 y.o., female Today's Date: 07/31/2022  PCP: [facility] REFERRING PROVIDER: [place] Desanctis, MD  END OF SESSION:   PT End of Session - 07/31/22 1517     Visit Number 4    Number of Visits 24    Date for PT Re-Evaluation 09/30/22    Authorization Type [place] [employer]ICA[employer] reporting period from 07/08/2022    Authorization Time Period max allowed 27 combined PT/OT/SLP, no auth required    Authorization - Visit Number 4    Authorization - Number of Visits 27    Progress Note Due on Visit 10    PT Start Time 6606    PT Stop Time 1555    PT Time Calculation (min) 40 min    Activity Tolerance Patient tolerated treatment well    Behavior During Therapy WFL for tasks assessed/performed               Past Medical History:  Diagnosis Date   Cancer ([place])    Endometrial cancer   Cirrhosis of liver ([place])    Depression    Diabetes mellitus without complication ([place])    Fatty liver disease, nonalcoholic 3016   GERD (gastroesophageal reflux disease)    Hyperlipidemia    Hypertension    Overactive bladder    Psoriasis 2015   Past Surgical History:  Procedure Laterality Date   ABDOMINAL HYSTERECTOMY  2007   CHOLECYSTECTOMY  2000   Patient Active Problem List   Diagnosis Date Noted   Abdominal pain 03/04/2018   Diabetes ([place]) 12/12/2015   Hyperlipidemia 12/12/2015   Hypertension 05/23/2015   Acid reflux 05/23/2015    REFERRING DIAG: adhesive capsulitis of left shoulder, left shoulder pain, unspecified chronicity  THERAPY DIAG:  Chronic left shoulder pain  Stiffness of left shoulder, not elsewhere classified  Muscle weakness (generalized)  Radiculopathy, cervical region  Rationale for Evaluation and Treatment: Rehabilitation  PERTINENT HISTORY: Patient is a 56 y.o. female who presents to outpatient physical therapy with a  referral for medical diagnosis adhesive capsulitis of left shoulder, left shoulder pain, unspecified chronicity. This patient's chief complaints consist of left shoulder pain, stiffness, and dysfunction leading to the following functional deficits: difficulty with anything requiring use of L UE such as lifting things, reaching, washing behind back, driving, sleeping. 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  Patient denies hx of stroke, seizures, lung problems, heart problems, unexplained changes in bowel or bladder problems, osteoporosis, and spinal surgery. Endorses history of unexplained weight loss, and stumbles and drops things all the time.   PRECAUTIONS: none  SUBJECTIVE: Patient reports she is doing well today and has no L shoulder pain upon arrival. She had some discomfort in her left shoulder after her PT session two days ago and then some achinesss there yesterday. Her HEP is going well but she feels that she lacks off when it starts to hurt. She has caught herself in  bad posture a lot now that she has been paying attention.   PAIN:  Are you having pain? No pain upon arrival. Paresthesia in left hand on her way here.   OBJECTIVE:   TODAY'S TREATMENT  Therapeutic exercise: to centralize symptoms and improve ROM, strength, muscular endurance, and activity tolerance required for successful completion of functional  activities.  - seated AAROM L shoulder flexion and abduction with pulleys, 1x2 min each direction, 5 second holds. (Reports increased paresthesia during flexion, then abduction especially to the 4th and 5th digits of left hand). (Needling/manual - see below) - chest press with plus, 2x10 with 2#DB (pain at ant left shld with plus, no worse).  - seated rows at [place], 2x15 at 09/15/19#,  VC/TC for improved posture and scapular motion.  - standing double wall slide lift off into scaption with side of hand on wall and YTB around wrists, 1x10.  Manual therapy: to reduce pain and tissue tension, improve range of motion, neuromodulation, in order to promote improved ability to complete functional activities. PRONE - STM to left upper trap - CPA grade III-IV to upper thoracic segments and cervical spine segments  SUPINE - STM to posterior cervical spine musculature including B UT, paraspinals,  - MDT cervical retraction with clinician over pressure, 1x10, 2x6 (last set increasing L finger tingling).   Modality:  Dry needling performed to left upper trap to decrease pain and spasms along patient's left shoulder region with patient in prone utilizing 1 dry needle(s) .53m x 376mwith 3 sticks at left upper trap . Patient educated about the risks and benefits from therapy and verbally consents to treatment.  Dry needling performed by SaEverlean AlstromSnGraylon GoodT, DPT who is certified in this technique.   Pt required multimodal cuing for proper technique and to facilitate improved neuromuscular control, strength, range of motion, and functional ability resulting in improved performance and form.   PATIENT EDUCATION: Exercise purpose/form. Self management techniques. Reviewed cancelation/no-show policy with patient and confirmed patient has correct phone number for clinic; patient verbalized understanding (07/15/22). Person educated: Patient Education method: Explanation, demonstration, verbal and tactile  cuing.  Education comprehension: verbalized understanding, demonstrated understanding and needs further education   HOME EXERCISE PROGRAM: Access Code: [email or web address]: [email or web address] Date: 07/15/2022 Prepared by: SaRosita KeaExercises - Seated Shoulder Flexion AAROM with Pulley Behind  - 1 x daily - 1 sets - 20 reps - 5 seconds hold - Seated Shoulder Abduction AAROM with Pulley Behind  - 1 x daily - 1 sets - 20 reps - 5 seconds hold - Seated Cervical Retraction  - 1 x daily - 3 sets - 10 reps - 5 seconds hold - Shoulder External Rotation and Scapular Retraction with Resistance  - 1 x daily - 3 sets - 10 reps   ASSESSMENT:   CLINICAL IMPRESSION: Patient tolerated treatment well overall with report of mild soreness after dry needling at the left upper trap and intermittent left hand paresthesia mainly affecting digits 4-5. Dry needling produced good twitch response at each insertion site and did not change hand paresthesia. Repeated cervical retraction with clinician overpressure did not change shoulder pain and started to produce (no worse) left hand paresthesia during end of last set performed. Patient continues to have concordant pain at anterior left shoulder with pushing with plus motions. She has pain and stiffness in L shoulder with PROM flexion. Plan to continue with shoulder strengthening and ROM interventions next session as appropriate. Patient would benefit from continued  management of limiting condition by skilled physical therapist to address remaining impairments and functional limitations to work towards stated goals and return to PLOF or maximal functional independence.    From initial PT eval on 07/08/2022:  Patient is a 56 y.o. female referred to outpatient physical therapy with a medical diagnosis of adhesive capsulitis of left shoulder, left shoulder pain, unspecified chronicity who presents with signs and symptoms consistent with chronic L shoulder pain and  stiffness and cervical radiculopathy affecting L UE especially in the C8 nerve root. Patient presents with positive special tests for rotator cuff related pain in the left shoulder. She appears to have radicular symptoms as well as shoulder pain and dysfunction. Limited ROM without severe pain and guarding suggest late phases of adhesive capsulitis, which often responds to PT better than early stages and improves her prognosis with PT. Patient presents with significant pain, ROM, joint stiffness, muscle tension, motor control, muscle performance (strength/power/endurance) and activity tolerance impairments that are limiting ability to complete anything requiring use of L UE such as lifting things, reaching, washing behind back, driving, sleeping without difficulty. Patient will benefit from skilled physical therapy intervention to address current body structure impairments and activity limitations to improve function and work towards goals set in current POC in order to return to prior level of function or maximal functional improvement.    OBJECTIVE IMPAIRMENTS decreased activity tolerance, decreased balance, decreased endurance, decreased knowledge of condition, decreased ROM, decreased strength, hypomobility, impaired perceived functional ability, increased muscle spasms, impaired flexibility, impaired UE functional use, improper body mechanics, obesity, and pain.    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    REHAB POTENTIAL: Good   CLINICAL DECISION MAKING: Stable/uncomplicated   EVALUATION COMPLEXITY: Low     GOALS: Goals reviewed with patient? No   SHORT TERM GOALS: Target date: 07/22/2022   Patient will be independent with initial home exercise program for self-management of symptoms. Baseline: Initial HEP to be provided at visit 2 as appropriate (07/08/22); Goal status: INITIAL     LONG TERM GOALS: Target date: 09/30/2022   Patient will be independent with a long-term home exercise program for self-management of symptoms.  Baseline: Initial HEP to be provided at visit 2 as appropriate (07/08/22); Goal status: INITIAL   2.  Patient will demonstrate improved FOTO to equal or greater than 52 by visit #9 to demonstrate improvement in overall condition and self-reported functional ability.  Baseline: 43 (07/08/22); Goal status: INITIAL   3.  Patient will demonstrate left shoulder AROM equal or greater than right shoulder AROM to be able to reach overhead and complete ADLs such as bathing with less difficulty.  Baseline: significantly limited - see objective exam  (07/08/22); Goal status: INITIAL   4.  Patient will demonstrate L shoulder strength equal or greater than R shoulder strength with no increase in pain beyond 1/10 NPRS to demonstrate improved ability to push, pull, carry, and use L UE for functional tasks such as lifting and driving.  Baseline:  significantly limited and painful (07/08/22); Goal status: INITIAL   5.  Patient will complete community, work and/or recreational activities without limitation due to current condition.  Baseline: difficulty with anything requiring use of L UE such as lifting things, reaching, washing behind back, driving, sleeping (03/49/17); Goal status: INITIAL   PLAN: PT FREQUENCY: 1-2x/week   PT DURATION: 12 weeks   PLANNED INTERVENTIONS: Therapeutic exercises, Therapeutic activity, Neuromuscular re-education, Balance training, Patient/Family education, Self Care, Joint mobilization, Dry Needling, Electrical stimulation, Spinal mobilization, Cryotherapy, Moist heat, Manual therapy, and Re-evaluation   PLAN FOR NEXT SESSION: update HEP as appropriate, manual therapy and exercises for improved ROM, shoulder girdle and postural strength as appropriate.     Nancy Nordmann, PT, DPT 07/31/2022, 7:31 PM  [facility] [address] [place], [place] [address] P: [phone] I F: [phone]

## 2022-08-05 ENCOUNTER — Encounter: Payer: Self-pay | Admitting: Physical Therapy

## 2022-08-05 ENCOUNTER — Ambulatory Visit: Payer: Medicaid Other | Attending: Orthopedic Surgery | Admitting: Physical Therapy

## 2022-08-05 DIAGNOSIS — M5412 Radiculopathy, cervical region: Secondary | ICD-10-CM | POA: Diagnosis present

## 2022-08-05 DIAGNOSIS — M6281 Muscle weakness (generalized): Secondary | ICD-10-CM | POA: Insufficient documentation

## 2022-08-05 DIAGNOSIS — G8929 Other chronic pain: Secondary | ICD-10-CM | POA: Insufficient documentation

## 2022-08-05 DIAGNOSIS — M25612 Stiffness of left shoulder, not elsewhere classified: Secondary | ICD-10-CM | POA: Diagnosis present

## 2022-08-05 DIAGNOSIS — M25512 Pain in left shoulder: Secondary | ICD-10-CM | POA: Insufficient documentation

## 2022-08-05 NOTE — Unmapped (Signed)
[facility] Clinical Assessment & Refill Coordination Note    Ashley Briggs, DOB: November 15, 1966  Phone: There are no phone numbers on file.    All above HIPAA information was verified with patient.     Was a Nurse, learning disability used for this call? No    Specialty Medication(s):   Inflammatory Disorders: Humira     Current Outpatient Medications   Medication Sig Dispense Refill    ADALIMUMAB PEN CITRATE FREE 40 MG/0.4 ML Inject 0.4 mL (40 mg total) under the skin every fourteen (14) days. 2 each 0    amitriptyline (ELAVIL) 10 MG tablet TAKE ONE TABLET BY MOUTH ONCE DAILY AT BEDTIME. (Patient not taking: Reported on 07/01/2022)      aspirin (ECOTRIN) 81 MG tablet Take 1 tablet (81 mg total) by mouth daily. 90 tablet 3    blood-glucose meter kit Use as directed by provider. 1 each 0    canagliflozin (INVOKANA) 300 mg Tab tablet Take 1 tablet (300 mg total) by mouth every morning before breakfast. 90 tablet 0    carvediloL (COREG) 6.25 MG tablet Take 1 tablet (6.25 mg total) by mouth Two (2) times a day. Start with 1 tablet nightly, if tolerating well increase to twice daily 180 tablet 3    celecoxib (CELEBREX) 200 MG capsule Take 1 capsule (200 mg total) by mouth two (2) times a day as needed for pain. 180 capsule 1    citalopram (CELEXA) 40 MG tablet Take 1 tablet (40 mg total) by mouth daily.      clonazePAM (KLONOPIN) 0.5 MG tablet Take 1 tablet (0.5 mg total) by mouth nightly as needed for anxiety and sleep. May repeat dose once. 40 tablet 1    cyclobenzaprine (FLEXERIL) 5 MG tablet Take 1-2 tablets by mouth every 8 hours as needed muscle pain or spasm. 30 tablet 1    dulaglutide (TRULICITY) 1.5 mg/0.5 mL PnIj Inject the contents of 2 syringes (3 mg total) under the skin every seven (7) days. 4 mL 6    ferrous sulfate (SLOW FE) 142 mg (45 mg iron) TbER Take 1 tablet (142 mg) by mouth two (2) times a day. 60 tablet 3    insulin glargine (LANTUS SOLOSTAR U-100 INSULIN) 100 unit/mL (3 mL) injection pen Inject 0.66 mL (66 Units total) under the skin nightly. 15 mL 6    lactulose 10 gram/15 mL solution Take 30 mL (20 g total) by mouth Three (3) times a day. 240 mL 0    lancets 30 gauge Misc Use as directed to test once daily. 100 each 3    metFORMIN (GLUCOPHAGE-XR) 500 MG 24 hr tablet Take 2 tablets (1,000 mg total) by mouth in the morning and 2 tablets (1,000 mg total) in the evening. Take with meals. 360 tablet 1    pantoprazole (PROTONIX) 40 MG tablet Take 1 tablet (40 mg total) by mouth Two (2) times a day. 180 tablet 1    pen needle, diabetic 32 gauge x 1/4 (6 mm) Ndle Use as directed to inject Lantus 100 each 3    predniSONE (DELTASONE) 10 MG tablet Take 4 tablets (40 mg total) by mouth daily for 3 days, THEN 3 tablets (30 mg total) daily for 3 days, THEN 2 tablets (20 mg total) daily for 3 days, THEN 1 tablet (10 mg total) daily for 3 days, THEN 0.5 tablets (5 mg total) daily for 3 days, THEN 3 tablets (30 mg total) daily for 2 days, THEN  2 tablets (20 mg total) daily for 2 days, THEN 1 tablet (10 mg total) daily for 2 days. 44 tablet 0    simvastatin (ZOCOR) 20 MG tablet Take 1 tablet (20 mg total) by mouth every evening. 90 tablet 1     No current facility-administered medications for this visit.        Changes to medications: Ashley Briggs reports no changes at this time.    No Known Allergies    Changes to allergies: No    SPECIALTY MEDICATION ADHERENCE     Humira 40  mg/0.31ml : 0 days of medicine on hand       Medication Adherence    Patient reported X missed doses in the last month: 0  Specialty Medication: Humira  Patient is on additional specialty medications: No  Informant: patient                            Specialty medication(s) dose(s) confirmed: Regimen is correct and unchanged.     Are there any concerns with adherence? No    Adherence counseling provided? Not needed    CLINICAL MANAGEMENT AND INTERVENTION      Clinical Benefit Assessment:    Do you feel the medicine is effective or helping your condition? Yes    Clinical Benefit counseling provided? Not needed    Adverse Effects Assessment:    Are you experiencing any side effects? No    Are you experiencing difficulty administering your medicine? No    Quality of Life Assessment:    Quality of Life    Rheumatology  Oncology  Dermatology  1. What impact has your specialty medication had on the symptoms of your skin condition (i.e. itchiness, soreness, stinging)?: Some  2. What impact has your specialty medication had on your comfort level with your skin?: Some  Cystic Fibrosis          How many days over the past month did your psoriasis  keep you from your normal activities? For example, brushing your teeth or getting up in the morning. 0    Have you discussed this with your provider? Not needed    Acute Infection Status:    Acute infections noted within Epic:  No active infections  Patient reported infection: None    Therapy Appropriateness:    Is therapy appropriate and patient progressing towards therapeutic goals? Yes, therapy is appropriate and should be continued    DISEASE/MEDICATION-SPECIFIC INFORMATION      For patients on injectable medications: Patient currently has 0 doses left.  Next injection is scheduled for 08/07/22.    PATIENT SPECIFIC NEEDS     Does the patient have any physical, cognitive, or cultural barriers? No    Is the patient high risk? No    Does the patient require a Care Management Plan? No     SOCIAL DETERMINANTS OF HEALTH     At the [facility], we have learned that life circumstances - like trouble affording food, housing, utilities, or transportation can affect the health of many of our patients.   That is why we wanted to ask: are you currently experiencing any life circumstances that are negatively impacting your health and/or quality of life? Patient declined to answer    Social Determinants of Health     Financial Resource Strain: Medium Risk (01/13/2022)    Overall Financial Resource Strain (CARDIA) Difficulty of Paying Living Expenses: Somewhat hard   Internet Connectivity: Not  on file   Food Insecurity: Food Insecurity Present (01/13/2022)    Hunger Vital Sign     Worried About Running Out of Food in the Last Year: Sometimes true     Ran Out of Food in the Last Year: Sometimes true   Tobacco Use: Medium Risk (07/20/2022)    Patient History     Smoking Tobacco Use: Never     Smokeless Tobacco Use: Never     Passive Exposure: Current   Housing/Utilities: Low Risk  (01/13/2022)    Housing/Utilities     Within the past 12 months, have you ever stayed: outside, in a car, in a tent, in an overnight shelter, or temporarily in someone else's home (i.e. couch-surfing)?: No     Are you worried about losing your housing?: No     Within the past 12 months, have you been unable to get utilities (heat, electricity) when it was really needed?: No   Alcohol Use: Not At Risk (01/09/2021)    Alcohol Use     How often do you have a drink containing alcohol?: Monthly or less     How many drinks containing alcohol do you have on a typical day when you are drinking?: 1 - 2     How often do you have 5 or more drinks on one occasion?: Never   Transportation Needs: No Transportation Needs (01/13/2022)    PRAPARE - Transportation     Lack of Transportation (Medical): No     Lack of Transportation (Non-Medical): No   Substance Use: Low Risk  (01/09/2021)    Substance Use     Taken prescription drugs for non-medical reasons: Never     Taken illegal drugs: Never     Patient indicated they have taken drugs in the past year for non-medical reasons: Yes, [positive answer(s)]: Not on file   Health Literacy: Low Risk  (01/09/2021)    Health Literacy     : Never   Physical Activity: Insufficiently Active (01/09/2021)    Exercise Vital Sign     Days of Exercise per Week: 1 day     Minutes of Exercise per Session: 40 min   Interpersonal Safety: Not on file   Stress: Not on file   Intimate Partner Violence: Not At Risk (01/09/2021)    Humiliation, Afraid, Rape, and Kick questionnaire     Fear of Current or Ex-Partner: No     Emotionally Abused: No     Physically Abused: No     Sexually Abused: No   Depression: Not at risk (07/12/2021)    PHQ-2     PHQ-2 Score: 0   Social Connections: Not on file       Would you be willing to receive help with any of the needs that you have identified today? Not applicable       SHIPPING     Specialty Medication(s) to be Shipped:   Inflammatory Disorders: Humira    Other medication(s) to be shipped: No additional medications requested for fill at this time     Changes to insurance: No    Delivery Scheduled: Yes, Expected medication delivery date: 08/06/22.     Medication will be delivered via Same Day Courier to the confirmed prescription address in [facility].    The patient will receive a drug information handout for each medication shipped and additional FDA Medication Guides as required.  Verified that patient has previously received a Conservation officer, historic buildings and a Surveyor, mining.  The patient or caregiver noted above participated in the development of this care plan and knows that they can request review of or adjustments to the care plan at any time.      All of the patient's questions and concerns have been addressed.    Arnold Long   [facility] Specialty Pharmacist

## 2022-08-05 NOTE — Therapy (Addendum)
OUTPATIENT PHYSICAL THERAPY TREATMENT NOTE   Patient Name: Abigail Molina MRN: [id number] DOB:May 09, 1966, 56 y.o., female Today's Date: 08/05/2022  PCP: [facility] REFERRING PROVIDER: Newport Desanctis, MD  END OF SESSION:   PT End of Session - 08/05/22 1721     Visit Number 5    Number of Visits 24    Date for PT Re-Evaluation 09/30/22    Authorization Type [place] MEDICAID [employer] reporting period from 07/08/2022    Authorization Time Period max allowed 27 combined PT/OT/SLP, no auth required    Authorization - Visit Number 5    Authorization - Number of Visits 27    Progress Note Due on Visit 10    PT Start Time 1594    PT Stop Time 1735    PT Time Calculation (min) 45 min    Activity Tolerance Patient tolerated treatment well    Behavior During Therapy WFL for tasks assessed/performed             Past Medical History:  Diagnosis Date   Cancer ([place])    Endometrial cancer   Cirrhosis of liver ([place])    Depression    Diabetes mellitus without complication ([place])    Fatty liver disease, nonalcoholic 5859   GERD (gastroesophageal reflux disease)    Hyperlipidemia    Hypertension    Overactive bladder    Psoriasis 2015   Past Surgical History:  Procedure Laterality Date   ABDOMINAL HYSTERECTOMY  2007   CHOLECYSTECTOMY  2000   Patient Active Problem List   Diagnosis Date Noted   Abdominal pain 03/04/2018   Diabetes ([place]) 12/12/2015   Hyperlipidemia 12/12/2015   Hypertension 05/23/2015   Acid reflux 05/23/2015    REFERRING DIAG: adhesive capsulitis of left shoulder, left shoulder pain, unspecified chronicity  THERAPY DIAG:  Chronic left shoulder pain  Stiffness of left shoulder, not elsewhere classified  Muscle weakness (generalized)  Radiculopathy, cervical region  Rationale for Evaluation and Treatment: Rehabilitation  PERTINENT HISTORY: Patient is a 56 y.o. female who presents to outpatient physical therapy with a referral for  medical diagnosis adhesive capsulitis of left shoulder, left shoulder pain, unspecified chronicity. This patient's chief complaints consist of left shoulder pain, stiffness, and dysfunction leading to the following functional deficits: difficulty with anything requiring use of L UE such as lifting things, reaching, washing behind back, driving, sleeping. 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  Patient denies hx of stroke, seizures, lung problems, heart problems, unexplained changes in bowel or bladder problems, osteoporosis, and spinal surgery. Endorses history of unexplained weight loss, and stumbles and drops things all the time.   PRECAUTIONS: none  SUBJECTIVE: Patient reports her left shoulder and her neck hurt a lot over the weekend off and on. She continues to have a lot of tingling with no improvement. She was sore in the left upper trap the day after PT but expected it. She thinks the dry needling may have helped. She states she did as much of her HEP  this weekend. The pain over the weekend was almost like it was before she came.   PAIN:  Are you having pain? No pain or paresthesia upon arrival.   OBJECTIVE  SELF-REPORTED FUNCTION FOTO score: 47/100 (shoulder questionnaire)   TODAY'S TREATMENT  Therapeutic exercise: to centralize symptoms and improve ROM, strength, muscular endurance, and activity tolerance required  for successful completion of functional activities.  - seated AAROM L shoulder flexion and abduction with pulleys, 1x2 min each direction, 5 second holds. (Reports increased paresthesia during flexion, then abduction especially to the 4th and 5th digits of left hand).  (Needling/manual - see below)  - seated chest press with plus at [place], 2x10 with 5# cable.   Circuit:  - seated rows at [employer], 3x15 at 25/30/35#,  VC/TC for improved posture and scapular motion. Decreasing scapular ROM from 30-35#.  - standing double wall slide lift off into scaption with side of hand on wall and YTB around wrists, 1x10.  Manual therapy: to reduce pain and tissue tension, improve range of motion, neuromodulation, in order to promote improved ability to complete functional activities. PRONE - STM to left upper trap and left infraspinatus/teres minor  Modality:  Dry needling performed to left upper trap and posterior RTC to decrease pain and spasms along patient's left shoulder region with patient in prone utilizing 2 dry needle(s) .16m x 345mwith 3 sticks at left upper trap and 2 sticks at left infraspinatus. Patient educated about the risks and benefits from therapy and verbally consents to treatment.  Dry needling performed by SaEverlean AlstromSnGraylon GoodT, DPT who is certified in this technique.   Pt required multimodal cuing for proper technique and to facilitate improved neuromuscular control, strength, range of motion, and functional ability resulting in improved performance and form.   PATIENT EDUCATION: Exercise purpose/form. Self management techniques. Reviewed cancelation/no-show policy with patient and confirmed patient has correct phone number for clinic; patient verbalized understanding (07/15/22). Person educated: Patient Education method: Explanation, demonstration, verbal and tactile cuing.  Education comprehension: verbalized understanding, demonstrated understanding and needs further  education   HOME EXERCISE PROGRAM: Access Code: [email or web address]: [email or web address] Date: 07/15/2022 Prepared by: SaRosita KeaExercises - Seated Shoulder Flexion AAROM with Pulley Behind  - 1 x daily - 1 sets - 20 reps - 5 seconds hold - Seated Shoulder Abduction AAROM with Pulley Behind  - 1 x daily - 1 sets - 20 reps - 5 seconds hold - Seated Cervical Retraction  - 1 x daily - 3 sets - 10 reps - 5 seconds hold - Shoulder External Rotation and Scapular Retraction with Resistance  - 1 x daily - 3 sets - 10 reps   ASSESSMENT:   CLINICAL IMPRESSION: Patient tolerated treatment well overall. She had concordant anterior shoulder pain with palpation to posterior L RTC. Dry needling repeated at left UT and also at L infraspinatus with resulting improved tolerance for chest press with plus. Continues to have difficulty with AROM overhead and tingling off and on with L UE exercise. Plan to continue with shoulder girdle strengthening. Patient would benefit from continued management of limiting condition by skilled physical therapist to address remaining impairments and functional limitations to work towards stated goals and return to PLOF or maximal functional independence.   From initial PT eval on 07/08/2022:  Patient is a [age].o. female referred to outpatient physical therapy with a medical diagnosis of adhesive capsulitis of left shoulder, left shoulder pain, unspecified chronicity who presents with signs  and symptoms consistent with chronic L shoulder pain and stiffness and cervical radiculopathy affecting L UE especially in the C8 nerve root. Patient presents with positive special tests for rotator cuff related pain in the left shoulder. She appears to have radicular symptoms as well as shoulder pain and dysfunction. Limited ROM without severe pain and guarding suggest late phases of adhesive capsulitis, which often responds to PT better than early stages and improves her prognosis with  PT. Patient presents with significant pain, ROM, joint stiffness, muscle tension, motor control, muscle performance (strength/power/endurance) and activity tolerance impairments that are limiting ability to complete anything requiring use of L UE such as lifting things, reaching, washing behind back, driving, sleeping without difficulty. Patient will benefit from skilled physical therapy intervention to address current body structure impairments and activity limitations to improve function and work towards goals set in current POC in order to return to prior level of function or maximal functional improvement.    OBJECTIVE IMPAIRMENTS decreased activity tolerance, decreased balance, decreased endurance, decreased knowledge of condition, decreased ROM, decreased strength, hypomobility, impaired perceived functional ability, increased muscle spasms, impaired flexibility, impaired UE functional use, improper body mechanics, obesity, and pain.    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    REHAB POTENTIAL: Good   CLINICAL DECISION MAKING: Stable/uncomplicated   EVALUATION COMPLEXITY: Low     GOALS: Goals reviewed with patient? No   SHORT TERM GOALS: Target date: 07/22/2022   Patient will be independent with initial home exercise program for self-management of symptoms. Baseline: Initial HEP to be provided at visit 2 as appropriate (07/08/22); Goal status: met     LONG TERM GOALS: Target date: 09/30/2022   Patient will be independent with a long-term home exercise program for self-management of symptoms.  Baseline: Initial HEP to be provided at visit 2 as appropriate (07/08/22); Goal status: In-progress   2.  Patient will demonstrate improved FOTO to equal or greater than 52 by visit #9 to demonstrate improvement in overall condition and self-reported functional ability.  Baseline: 43 (07/08/22); 47 at visit # 5 (08/05/2022);  Goal status: In-progress   3.  Patient will demonstrate left shoulder AROM equal or greater than right shoulder AROM to be able to reach overhead and complete ADLs such as bathing with less difficulty.  Baseline: significantly limited - see objective exam  (07/08/22); Goal status: in-progress   4.  Patient will demonstrate L shoulder strength equal or greater than R shoulder strength with no increase in pain beyond 1/10 NPRS to demonstrate improved ability to push, pull, carry, and use L UE for functional tasks such as lifting and driving.  Baseline: significantly  limited and painful (07/08/22); Goal status: In-progress   5.  Patient will complete community, work and/or recreational activities without limitation due to current condition.  Baseline: difficulty with anything requiring use of L UE such as lifting things, reaching, washing behind back, driving, sleeping (82/51/89); Goal status: In-progress   PLAN: PT FREQUENCY: 1-2x/week   PT  DURATION: 12 weeks   PLANNED INTERVENTIONS: Therapeutic exercises, Therapeutic activity, Neuromuscular re-education, Balance training, Patient/Family education, Self Care, Joint mobilization, Dry Needling, Electrical stimulation, Spinal mobilization, Cryotherapy, Moist heat, Manual therapy, and Re-evaluation   PLAN FOR NEXT SESSION: update HEP as appropriate, manual therapy and exercises for improved ROM, shoulder girdle and postural strength as appropriate.     Nancy Nordmann, PT, DPT 08/05/2022, 7:37 PM  [facility] [address] [place], [place] [address] P: [phone] I F: [phone]

## 2022-08-06 ENCOUNTER — Other Ambulatory Visit: Payer: Self-pay | Admitting: Psychiatry

## 2022-08-06 ENCOUNTER — Ambulatory Visit: Admit: 2022-08-06 | Discharge: 2022-08-07 | Payer: PRIVATE HEALTH INSURANCE

## 2022-08-06 DIAGNOSIS — R040 Epistaxis: Principal | ICD-10-CM

## 2022-08-06 DIAGNOSIS — R519 Bilateral headache: Principal | ICD-10-CM

## 2022-08-06 DIAGNOSIS — Z872 Personal history of diseases of the skin and subcutaneous tissue: Principal | ICD-10-CM

## 2022-08-06 DIAGNOSIS — M2550 Pain in unspecified joint: Principal | ICD-10-CM

## 2022-08-06 MED FILL — HUMIRA PEN CITRATE FREE 40 MG/0.4 ML: SUBCUTANEOUS | 28 days supply | Qty: 2 | Fill #0

## 2022-08-06 MED FILL — INSULIN GLARGINE (U-100) 100 UNIT/ML (3 ML) SUBCUTANEOUS PEN: SUBCUTANEOUS | 22 days supply | Qty: 15 | Fill #2

## 2022-08-06 NOTE — Unmapped (Signed)
Please call [phone] to schedule your imaging study.    Mammogram: please call [phone].    [facility]  [address]  [place], [place] [address]      [facility]  [address]  [place], [place] [address]

## 2022-08-06 NOTE — Unmapped (Signed)
Assessment and Plan:     Ashley Briggs was seen today for follow-up.    Diagnoses and all orders for this visit:    Hx of psoriasis  Arthralgia, unspecified joint          -     Patient has started Humira, currently completed 4 doses, feels this is helping improve psoriasis--plaques resolving, less itching        -     Currently taking prednisone taper, feels joint pains improving--has chronic left shoulder pain--getting physical therapy, bilateral knee pains.        -     Referral placed to [facility] rheumatology, aware of wait times.  -     Ambulatory referral to Rheumatology; Future    Bilateral headache          -     Discussed headache prevention: Adequate hydration, sleep, healthy diet, stress management, management of neck/shoulder pains.         -     Can take Tylenol as needed, NSAIDs best avoided given liver disease. Given CAD hx, not a candidate for triptans        -     Given new onset of headaches, co-morbid conditions--referred for diagnostics, neurology consult        -     Return/ER precautions discussed with patient.   -     Neurology; Future  -     MRI Brain W Wo Contrast; Future      Nasal bleeding           -Discussed increasing hydration to nasal tissues via nasal saline spray, humidifier, Vaseline         -If ongoing, worsening symptoms--can consider ENT referral.         -Given patient's comorbidities, caution with medications like Afrin.    Other orders  -     INFLUENZA VACCINE (QUAD) IM - 6 MO-ADULT - PF        Barriers to recommended plan: None identified    Return if symptoms worsen or fail to improve, for Schedule w/GF in 1-2 months-chronic conditions.      Subjective:     HPI: Ashley Briggs is a 56 y.o. female here for Follow-up (ED joint pains/Headaches ).    ED follow-up visit 07/20/22:  Seen at [facility] ED for joint pains, concerned psoriasis has impacted joints.  Started on steroid dose pack, now taking 40 mg QD  Noticed less joint pain, getting ready for 4 th dose of Humira--itching less, less plaques on skin  States 'every joint in body was painful', now pain to B knees, left shoulder  Left shoulder pain--currently going to PT, feels helpful to improve mobility    Nose bleeds:    Unilateral nose bleeds--no pain, occurs suddenly, no nasal congestion, no runny nose, no hx of recurrent sinus infections  In past, nosebleeds were occasional- has had for past 1 year, lasts for 2 minutes, applies pressure with tissue and resolves  Resolves spontaneously, usually 1-2 minutes, no bleeding in gums, blood in stool/urine, no easy bruising  Hx of liver disease, last platelet count 114-118      Headaches: occurs every day, once pt gets up and moves around, has headache  Has throbbing, pulsing pain; Usually pains all over head, sometimes focal on B temples. Gets mid-morning, taking tylenol 650 mg tablets--dulls pains, never takes  pain away  Occurring for past 2 months, getting worse.   No changes in health--except started  Humira  Sleep poor, Diet: eats a lot fruit, watching sodium, stopped fried foods, cut out fast foods, cut out chips. Eating consistent meals throughout day. Drinking more water.  No fluid restriction, has increased exercise--walking dog, cleaning house  Has nausea due to liver disease, no vomiting, no fever, no new pins/needles/weakness, no: syncope, chest pains, shortness of breath associated with headaches.  Prior to 2 months, pt did not have Has/migraines.  Wears glasses, vision UTD, no visual changes/symptoms, no photophobia.  Taking flexiril for B knee pain--does not feel helping headaches    I have reviewed past medical, surgical, medications, allergies, social and family histories today and updated them in Epic where appropriate.    ROS:       Review of systems negative unless otherwise noted as per HPI.      Objective:     Vitals:    08/06/22 1119   BP: 138/60   Pulse: 80   Temp: 36.9 ??C (98.4 ??F)   SpO2: 97%     Body mass index is 41.37 kg/m??.    Physical Exam  Vitals and nursing note reviewed.   Constitutional:       Appearance: Normal appearance.   HENT:      Head: Normocephalic and atraumatic.      Right Ear: Tympanic membrane, ear canal and external ear normal.      Left Ear: Tympanic membrane, ear canal and external ear normal.      Nose:      Comments: Mildly inflamed turbinates noted B nares, no active bleeding     Mouth/Throat:      Mouth: Mucous membranes are moist.      Pharynx: Oropharynx is clear.   Eyes:      Extraocular Movements: Extraocular movements intact.      Conjunctiva/sclera: Conjunctivae normal.      Pupils: Pupils are equal, round, and reactive to light.   Neck:      Comments: Soft tissue tenderness noted R lateral neck, no c-spine bony tenderness  Cardiovascular:      Rate and Rhythm: Normal rate and regular rhythm.      Pulses: Normal pulses.      Heart sounds: Normal heart sounds.   Pulmonary:      Effort: Pulmonary effort is normal.      Breath sounds: Normal breath sounds.   Musculoskeletal:         General: Normal range of motion.   Skin:     General: Skin is warm and dry.      Capillary Refill: Capillary refill takes less than 2 seconds.   Neurological:      General: No focal deficit present.      Mental Status: She is alert and oriented to person, place, and time. Mental status is at baseline.      Cranial Nerves: No cranial nerve deficit.      Sensory: No sensory deficit.      Motor: No weakness.      Coordination: Coordination normal.      Gait: Gait normal.      Deep Tendon Reflexes: Reflexes normal.   Psychiatric:         Mood and Affect: Mood normal.         Behavior: Behavior normal.         Thought Content: Thought content normal.         Judgment: Judgment normal.            Medication adherence and  barriers to the treatment plan have been addressed. Opportunities to optimize healthy behaviors have been discussed. Patient / caregiver voiced understanding.   I personally spent 35 minutes face-to-face and non-face-to-face in the care of this patient, which includes all pre, intra, and post visit time on the date of service.  Cleon Dew, DNP, FNP-C  [facility]  [phone] (F)    Note - This record has been created using [employer]. Chart creation errors have been sought, but may not always have been located. Such creation errors do not reflect on the standard of medical care.

## 2022-08-07 ENCOUNTER — Encounter: Payer: Medicaid Other | Admitting: Physical Therapy

## 2022-08-12 ENCOUNTER — Encounter: Payer: Self-pay | Admitting: Physical Therapy

## 2022-08-12 ENCOUNTER — Ambulatory Visit: Payer: Medicaid Other | Admitting: Physical Therapy

## 2022-08-12 DIAGNOSIS — M25512 Pain in left shoulder: Secondary | ICD-10-CM | POA: Diagnosis not present

## 2022-08-12 DIAGNOSIS — G8929 Other chronic pain: Secondary | ICD-10-CM

## 2022-08-12 DIAGNOSIS — M5412 Radiculopathy, cervical region: Secondary | ICD-10-CM

## 2022-08-12 DIAGNOSIS — M6281 Muscle weakness (generalized): Secondary | ICD-10-CM

## 2022-08-12 DIAGNOSIS — M25612 Stiffness of left shoulder, not elsewhere classified: Secondary | ICD-10-CM

## 2022-08-12 NOTE — Unmapped (Signed)
A1c Outreach complete

## 2022-08-12 NOTE — Therapy (Signed)
OUTPATIENT PHYSICAL THERAPY TREATMENT NOTE   Patient Name: Abigail Molina MRN: [id number] DOB:[birth year], 56 y.o., female Today's Date: 08/12/2022  PCP: [facility] REFERRING PROVIDER: [place]ville Desanctis, MD  END OF SESSION:   PT End of Session - 08/12/22 1332     Visit Number 6    Number of Visits 24    Date for PT Re-Evaluation 09/30/22    Authorization Type [place] [employer]ICA[employer] reporting period from 07/08/2022    Authorization Time Period max allowed 27 combined PT/OT/SLP, no auth required    Authorization - Visit Number 6    Authorization - Number of Visits 27    Progress Note Due on Visit 10    PT Start Time 1305    PT Stop Time 1350    PT Time Calculation (min) 45 min    Activity Tolerance Patient tolerated treatment well    Behavior During Therapy WFL for tasks assessed/performed              Past Medical History:  Diagnosis Date   Cancer ([place])    Endometrial cancer   Cirrhosis of liver ([place])    Depression    Diabetes mellitus without complication ([place])    Fatty liver disease, nonalcoholic 3335   GERD (gastroesophageal reflux disease)    Hyperlipidemia    Hypertension    Overactive bladder    Psoriasis 2015   Past Surgical History:  Procedure Laterality Date   ABDOMINAL HYSTERECTOMY  2007   CHOLECYSTECTOMY  2000   Patient Active Problem List   Diagnosis Date Noted   Abdominal pain 03/04/2018   Diabetes ([place]) 12/12/2015   Hyperlipidemia 12/12/2015   Hypertension 05/23/2015   Acid reflux 05/23/2015    REFERRING DIAG: adhesive capsulitis of left shoulder, left shoulder pain, unspecified chronicity  THERAPY DIAG:  Chronic left shoulder pain  Stiffness of left shoulder, not elsewhere classified  Muscle weakness (generalized)  Radiculopathy, cervical region  Rationale for Evaluation and Treatment: Rehabilitation  PERTINENT HISTORY: Patient is a 56 y.o. female who presents to outpatient physical therapy with a referral  for medical diagnosis adhesive capsulitis of left shoulder, left shoulder pain, unspecified chronicity. This patient's chief complaints consist of left shoulder pain, stiffness, and dysfunction leading to the following functional deficits: difficulty with anything requiring use of L UE such as lifting things, reaching, washing behind back, driving, sleeping. 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  Patient denies hx of stroke, seizures, lung problems, heart problems, unexplained changes in bowel or bladder problems, osteoporosis, and spinal surgery. Endorses history of unexplained weight loss, and stumbles and drops things all the time.   PRECAUTIONS: none  SUBJECTIVE: Patient reports she is feeling okay except for her left shoulder. She states she "tweaked it" this morning when toweling off. She states lifting heavy things bothers her shoulder, reaching up, driving and turning left, or anything that uses left UE can cause pain and she notices her left shoulder feels weak. She has been doing her HEP and  she increased her resistance and and reps on her HEP some. She is almost done with a prednisone taper she took for possible psoriatic arthritis. She does not feel any effect on her shoulder. She has been having a lot of headaches so she has a brain MRI on Saturday and then will be seeing the neurologist later. She felt okay after last PT  session and feels that the dry needling was helpful. She continues to have some paresthesia in her left hand.   PAIN:  Are you having pain? Left anterior shoulder 8/10. No paresthesia upon arrival.   OBJECTIVE   TODAY'S TREATMENT  Therapeutic exercise: to centralize symptoms and improve ROM, strength, muscular endurance, and activity tolerance required for successful completion of functional activities.  - seated AAROM L shoulder flexion and abduction with pulleys, 1x2 min each direction, 5 second holds. (Reports increased paresthesia during flexion, then abduction especially to the 4th and 5th digits of left hand).  (Needling/manual - see below)  - prone scapular retraction with palms on table, thumbs away from body, 1x10 with hands remaining on table, 1x10 with hand lift (pulling a little at R ribs).  - standing scapular rows, 1x20 with BlueTB - standing B shoulder ER with YTB, 1x10 with cuing to keep elbows by sides.  - standing L shoulder ER with towel roll under arm, 1x10 with YTB - standing L shoulder IR with towel roll under arm, 1x10 with YTB.  - Education on HEP including handout   Manual therapy: to reduce pain and tissue tension, improve range of motion, neuromodulation, in order to promote improved ability to complete functional activities. PRONE - STM to left infraspinatus/teres minor - CPA grade II-IV from mid thoracic spine to lower cervical spine with and without KE wedge. Patient suddenly had pain to front of rib cage with CPA to approx T8. Able to tolerate lighter grade II-III mobilizations following.   Modality:  Dry needling performed to left  posterior RTC to decrease pain and spasms along patient's left shoulder region with patient in prone utilizing 2 dry needle(s) .16m x 332mwith 2 sticks at left infraspinatus. Patient educated about the risks and benefits from therapy and verbally consents to treatment.  Dry needling performed by SaEverlean AlstromSnGraylon GoodT, DPT who is  certified in this technique.   Pt required multimodal cuing for proper technique and to facilitate improved neuromuscular control, strength, range of motion, and functional ability resulting in improved performance and form.   PATIENT EDUCATION: Exercise purpose/form. Self management techniques. Reviewed cancelation/no-show policy with patient and confirmed patient has correct phone number for clinic; patient verbalized understanding (07/15/22). Person educated: Patient Education method: Explanation, demonstration, verbal and tactile cuing.  Education comprehension: verbalized understanding, demonstrated understanding and needs further education   HOME EXERCISE PROGRAM: Access Code: [email or web address]: [email or web address] Date: 08/12/2022 Prepared by: SaRosita KeaExercises - Seated Shoulder Flexion AAROM with Pulley Behind  - 1 x daily - 1 sets - 20 reps - 5 seconds hold - Seated Shoulder Abduction AAROM with Pulley Behind  - 1 x daily - 1 sets - 20 reps - 5 seconds hold - Row with band/cable  - 1 x daily - 3 sets - 10 reps - 2 seconds hold - Shoulder External Rotation with Anchored Resistance  - 1 x daily - 3 sets - 10 reps - 2 seconds hold - Shoulder Internal Rotation with Resistance  - 1 x daily - 3 sets - 10 reps - 2 seconds hold -  Shoulder Flexion Wall Slide with Resistance Band  - 1 x daily - 3 sets - 10 reps - 2 seconds hold   ASSESSMENT:   CLINICAL IMPRESSION: Patient tolerated treatment well overall except new pain that occurred with CPA to approx T8 that caused right sided rib pain and lingering soreness. Progressed postural and L shoulder exercises and HEP as appropriate. Continues to be very sore over L posterior RTC to touch. Plan to continue with shoulder girdle strengthening and not continue spinal mobility joint mobilizations due to negative reaction with rib pain today. Patient would benefit from continued management of limiting condition by skilled physical  therapist to address remaining impairments and functional limitations to work towards stated goals and return to PLOF or maximal functional independence.   From initial PT eval on 07/08/2022:  Patient is a 56 y.o. female referred to outpatient physical therapy with a medical diagnosis of adhesive capsulitis of left shoulder, left shoulder pain, unspecified chronicity who presents with signs and symptoms consistent with chronic L shoulder pain and stiffness and cervical radiculopathy affecting L UE especially in the C8 nerve root. Patient presents with positive special tests for rotator cuff related pain in the left shoulder. She appears to have radicular symptoms as well as shoulder pain and dysfunction. Limited ROM without severe pain and guarding suggest late phases of adhesive capsulitis, which often responds to PT better than early stages and improves her prognosis with PT. Patient presents with significant pain, ROM, joint stiffness, muscle tension, motor control, muscle performance (strength/power/endurance) and activity tolerance impairments that are limiting ability to complete anything requiring use of L UE such as lifting things, reaching, washing behind back, driving, sleeping without difficulty. Patient will benefit from skilled physical therapy intervention to address current body structure impairments and activity limitations to improve function and work towards goals set in current POC in order to return to prior level of function or maximal functional improvement.    OBJECTIVE IMPAIRMENTS decreased activity tolerance, decreased balance, decreased endurance, decreased knowledge of condition, decreased ROM, decreased strength, hypomobility, impaired perceived functional ability, increased muscle spasms, impaired flexibility, impaired UE functional use, improper body mechanics, obesity, and pain.    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    REHAB POTENTIAL: Good   CLINICAL DECISION MAKING: Stable/uncomplicated   EVALUATION COMPLEXITY: Low     GOALS: Goals reviewed with patient? No   SHORT TERM GOALS: Target date: 07/22/2022   Patient will be independent with initial home exercise program for self-management of symptoms. Baseline: Initial HEP to be provided at visit 2 as appropriate (07/08/22); Goal status: met     LONG TERM GOALS: Target date:  09/30/2022   Patient will be independent with a long-term home exercise program for self-management of symptoms.  Baseline: Initial HEP to be provided at visit 2 as appropriate (07/08/22); Goal status: In-progress   2.  Patient will demonstrate improved FOTO to equal or greater than 52 by visit #9 to demonstrate improvement in overall condition and self-reported functional ability.  Baseline: 43 (07/08/22); 47 at visit # 5 (08/05/2022);  Goal status: In-progress   3.  Patient will demonstrate left shoulder AROM equal or  greater than right shoulder AROM to be able to reach overhead and complete ADLs such as bathing with less difficulty.  Baseline: significantly limited - see objective exam  (07/08/22); Goal status: in-progress   4.  Patient will demonstrate L shoulder strength equal or greater than R shoulder strength with no increase in pain beyond 1/10 NPRS to demonstrate improved ability to push, pull, carry, and use L UE for functional tasks such as lifting and driving.  Baseline: significantly limited and painful (07/08/22); Goal status: In-progress   5.  Patient will complete community, work and/or recreational activities without limitation due to current condition.  Baseline: difficulty with anything requiring use of L UE such as lifting things, reaching, washing behind back, driving, sleeping (75/64/33); Goal status: In-progress   PLAN: PT FREQUENCY: 1-2x/week   PT DURATION: 12 weeks   PLANNED INTERVENTIONS: Therapeutic exercises, Therapeutic activity, Neuromuscular re-education, Balance training, Patient/Family education, Self Care, Joint mobilization, Dry Needling, Electrical stimulation, Spinal mobilization, Cryotherapy, Moist heat, Manual therapy, and Re-evaluation   PLAN FOR NEXT SESSION: update HEP as appropriate, manual therapy and exercises for improved ROM, shoulder girdle and postural strength as appropriate.     Nancy Nordmann, PT, DPT 08/12/2022, 2:08 PM  [facility] [address] [place], [place] [address] P: [phone] I F: [phone]

## 2022-08-14 ENCOUNTER — Encounter: Payer: Self-pay | Admitting: Physical Therapy

## 2022-08-14 ENCOUNTER — Ambulatory Visit: Payer: Medicaid Other | Admitting: Physical Therapy

## 2022-08-14 DIAGNOSIS — M25612 Stiffness of left shoulder, not elsewhere classified: Secondary | ICD-10-CM

## 2022-08-14 DIAGNOSIS — M5412 Radiculopathy, cervical region: Secondary | ICD-10-CM

## 2022-08-14 DIAGNOSIS — M6281 Muscle weakness (generalized): Secondary | ICD-10-CM

## 2022-08-14 DIAGNOSIS — G8929 Other chronic pain: Secondary | ICD-10-CM

## 2022-08-14 DIAGNOSIS — M25512 Pain in left shoulder: Secondary | ICD-10-CM | POA: Diagnosis not present

## 2022-08-14 NOTE — Unmapped (Signed)
Patient is due for A1C along with other follow up needs.  Office Staff, please call patient to schedule a visit with Dr. Nira Retort, thank you.

## 2022-08-14 NOTE — Unmapped (Signed)
Patient called. She received a message that it was time for her A1C to be done. There is not an order for that. Please place order and then call patient to schedule.

## 2022-08-14 NOTE — Therapy (Signed)
OUTPATIENT PHYSICAL THERAPY TREATMENT NOTE   Patient Name: Abigail Molina MRN: [id number] DOB:1966/05/24, 56 y.o., female Today's Date: 08/14/2022  PCP: [facility] REFERRING PROVIDER: [place] Desanctis, MD  END OF SESSION:   PT End of Session - 08/14/22 1557     Visit Number 7    Number of Visits 24    Date for PT Re-Evaluation 09/30/22    Authorization Type [place] [employer] reporting period from 07/08/2022    Authorization Time Period max allowed 27 combined PT/OT/SLP, no auth required    Authorization - Visit Number 7    Authorization - Number of Visits 27    Progress Note Due on Visit 10    PT Start Time 1520    PT Stop Time 1558    PT Time Calculation (min) 38 min    Activity Tolerance Patient tolerated treatment well    Behavior During Therapy WFL for tasks assessed/performed               Past Medical History:  Diagnosis Date   Cancer ([place])    Endometrial cancer   Cirrhosis of liver ([place])    Depression    Diabetes mellitus without complication ([place])    Fatty liver disease, nonalcoholic 2951   GERD (gastroesophageal reflux disease)    Hyperlipidemia    Hypertension    Overactive bladder    Psoriasis 2015   Past Surgical History:  Procedure Laterality Date   ABDOMINAL HYSTERECTOMY  2007   CHOLECYSTECTOMY  2000   Patient Active Problem List   Diagnosis Date Noted   Abdominal pain 03/04/2018   Diabetes ([place]) 12/12/2015   Hyperlipidemia 12/12/2015   Hypertension 05/23/2015   Acid reflux 05/23/2015    REFERRING DIAG: adhesive capsulitis of left shoulder, left shoulder pain, unspecified chronicity  THERAPY DIAG:  Chronic left shoulder pain  Stiffness of left shoulder, not elsewhere classified  Muscle weakness (generalized)  Radiculopathy, cervical region  Rationale for Evaluation and Treatment: Rehabilitation  PERTINENT HISTORY: Patient is a 56 y.o. female who presents to outpatient physical therapy with a  referral for medical diagnosis adhesive capsulitis of left shoulder, left shoulder pain, unspecified chronicity. This patient's chief complaints consist of left shoulder pain, stiffness, and dysfunction leading to the following functional deficits: difficulty with anything requiring use of L UE such as lifting things, reaching, washing behind back, driving, sleeping. 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  Patient denies hx of stroke, seizures, lung problems, heart problems, unexplained changes in bowel or bladder problems, osteoporosis, and spinal surgery. Endorses history of unexplained weight loss, and stumbles and drops things all the time.   PRECAUTIONS: none  SUBJECTIVE: Patient reports her right left shoulder is bothering her a lot. She has no paresthesia upon arrival but it has stayed about the same overall. She states her right rib has been really sore where she felt the pain last PT session during CPAs to the thoracic spine. She states she has been having trouble sleeping at night  but attributes that to a lot of things including taking prednisone currently. She has a brain MRI schedule for this weekend. She did not do her HEP yesterday because her shoulder was so sore. She felt okay directly after her last PT session.   PAIN:  Are you having pain? Left anterior and over top of shoulder 7/10. No paresthesia upon arrival.    OBJECTIVE   TODAY'S TREATMENT  Therapeutic exercise: to centralize symptoms and improve ROM, strength, muscular endurance, and activity tolerance required for successful completion of functional activities.  - seated AAROM L shoulder flexion and abduction with pulleys, 1x2 min each direction, 5 second holds. (Reports increased paresthesia during flexion, then abduction especially to the 4th and 5th digits of left hand).  Circuit:  - standing scapular rows, 3x15 with BlueTB - standing L shoulder IR with towel roll under arm, 1x15 with YTB.  - standing L shoulder ER with towel roll under arm, 1x15 with YTB  - B wall slide in scaption to lift off, 3x10 with YTB looped around wrists with isometric outward push against band.  - standing B shoulder extension with scapular retraction, YTB anchored above head with handles, thumbs facing out. 3x10.  - standing cervical retraction with back against wall, 1x10 with 5 second holds  Pt required multimodal cuing for proper technique and to facilitate improved neuromuscular control, strength, range of motion, and functional ability resulting in improved performance and form.   PATIENT EDUCATION: Exercise purpose/form. Self management techniques. Reviewed cancelation/no-show policy with patient and confirmed patient has correct phone number for clinic; patient verbalized understanding (07/15/22). Person educated: Patient Education method: Explanation, demonstration, verbal and tactile cuing.  Education comprehension: verbalized understanding, demonstrated understanding and needs further education   HOME EXERCISE PROGRAM: Access Code: [email or web address] URL: [email or web address] Date: 08/12/2022 Prepared by: Rosita Kea  Exercises - Seated Shoulder Flexion AAROM with Pulley Behind  - 1 x daily - 1 sets - 20 reps - 5 seconds hold - Seated Shoulder Abduction AAROM with Pulley Behind  - 1 x daily - 1 sets - 20 reps - 5 seconds hold - Row with  band/cable  - 1 x daily - 3 sets - 10 reps - 2 seconds hold - Shoulder External Rotation with Anchored Resistance  - 1 x daily - 3 sets - 10 reps - 2 seconds hold - Shoulder Internal Rotation with Resistance  - 1 x daily - 3 sets - 10 reps - 2 seconds hold - Shoulder Flexion Wall Slide with Resistance Band  - 1 x daily - 3 sets - 10 reps - 2 seconds hold   ASSESSMENT:   CLINICAL IMPRESSION: Patient arrives with reported L shoulder pain of 7/10 upon arrival that remained the same throughout session. Patient did return with increased/lasting pain at the right ribs following pain with CPA at approx T8 last session. Patient encouraged that this will likely improve with time. Patient does not appear appropriate for further joint mobs to the thoracic spine at this point. Patient was unable to lie prone due to pain around right rib and it hurt worse after attempting it. Patient would benefit from continued management of limiting condition by skilled physical therapist to address remaining impairments and functional limitations to work towards stated goals and return to PLOF or maximal functional independence.   From initial PT eval on 07/08/2022:  Patient is a 56 y.o. female referred to outpatient physical therapy with a medical diagnosis of adhesive capsulitis of left shoulder, left shoulder pain, unspecified  chronicity who presents with signs and symptoms consistent with chronic L shoulder pain and stiffness and cervical radiculopathy affecting L UE especially in the C8 nerve root. Patient presents with positive special tests for rotator cuff related pain in the left shoulder. She appears to have radicular symptoms as well as shoulder pain and dysfunction. Limited ROM without severe pain and guarding suggest late phases of adhesive capsulitis, which often responds to PT better than early stages and improves her prognosis with PT. Patient presents with significant pain, ROM, joint stiffness, muscle tension, motor  control, muscle performance (strength/power/endurance) and activity tolerance impairments that are limiting ability to complete anything requiring use of L UE such as lifting things, reaching, washing behind back, driving, sleeping without difficulty. Patient will benefit from skilled physical therapy intervention to address current body structure impairments and activity limitations to improve function and work towards goals set in current POC in order to return to prior level of function or maximal functional improvement.    OBJECTIVE IMPAIRMENTS decreased activity tolerance, decreased balance, decreased endurance, decreased knowledge of condition, decreased ROM, decreased strength, hypomobility, impaired perceived functional ability, increased muscle spasms, impaired flexibility, impaired UE functional use, improper body mechanics, obesity, and pain.    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    REHAB POTENTIAL: Good   CLINICAL DECISION MAKING: Stable/uncomplicated   EVALUATION COMPLEXITY: Low     GOALS: Goals reviewed with patient? No   SHORT TERM GOALS: Target date: 07/22/2022   Patient will be independent with initial home exercise program for self-management of symptoms. Baseline: Initial HEP to be provided at visit 2 as appropriate (07/08/22); Goal status: met     LONG TERM GOALS: Target date: 09/30/2022   Patient will be independent with a long-term home exercise program for self-management of symptoms.  Baseline: Initial HEP to be provided at visit 2 as appropriate (07/08/22); Goal status: In-progress   2.  Patient will demonstrate improved FOTO to equal or greater than 52 by visit #9 to demonstrate improvement in overall condition and self-reported functional ability.  Baseline: 43 (07/08/22); 47 at visit # 5 (08/05/2022);  Goal status: In-progress   3.  Patient will demonstrate left shoulder AROM equal or greater than right shoulder AROM to be able to reach overhead and complete ADLs such as bathing with less difficulty.  Baseline: significantly limited - see objective exam  (07/08/22); Goal status: in-progress   4.  Patient will demonstrate L shoulder strength equal or greater than R shoulder strength with no increase in pain beyond 1/10 NPRS to demonstrate improved ability to push, pull, carry, and use L UE for functional tasks such as lifting  and driving.  Baseline: significantly limited and painful (07/08/22); Goal status: In-progress   5.  Patient will complete community, work and/or recreational activities without limitation due to current condition.  Baseline: difficulty with anything requiring use of L UE such as lifting things, reaching, washing behind back, driving, sleeping (56/25/63); Goal status: In-progress   PLAN: PT FREQUENCY: 1-2x/week   PT DURATION: 12 weeks   PLANNED INTERVENTIONS: Therapeutic exercises, Therapeutic activity,  Neuromuscular re-education, Balance training, Patient/Family education, Self Care, Joint mobilization, Dry Needling, Electrical stimulation, Spinal mobilization, Cryotherapy, Moist heat, Manual therapy, and Re-evaluation   PLAN FOR NEXT SESSION: update HEP as appropriate, manual therapy and exercises for improved ROM, shoulder girdle and postural strength as appropriate.     Nancy Nordmann, PT, DPT 08/14/2022, 4:01 PM  [facility] [address] [place], [place] [address] P: [phone] I F: [phone]

## 2022-08-16 ENCOUNTER — Ambulatory Visit: Admit: 2022-08-16 | Discharge: 2022-08-17 | Payer: PRIVATE HEALTH INSURANCE

## 2022-08-16 DIAGNOSIS — D320 Benign neoplasm of cerebral meninges: Secondary | ICD-10-CM | POA: Insufficient documentation

## 2022-08-16 MED ADMIN — gadobenate dimeglumine (MULTIHANCE) 529 mg/mL (0.1mmol/0.2mL) solution 20 mL: 20 mL | INTRAVENOUS | @ 17:00:00 | Stop: 2022-08-16

## 2022-08-18 DIAGNOSIS — D329 Benign neoplasm of meninges, unspecified: Principal | ICD-10-CM

## 2022-08-18 MED FILL — METFORMIN ER 500 MG TABLET,EXTENDED RELEASE 24 HR: ORAL | 90 days supply | Qty: 360 | Fill #1

## 2022-08-18 NOTE — Unmapped (Signed)
Spoke to patient, reviewed results of brain MRI.  Although no acute findings, meningioma noted on MRI, patient with ongoing headaches, nosebleeds have stopped.    Patient agreeable for neurosurgery referral to discuss significance of finding and management.  Reviewed referral process, patient verbalized understanding, no additional questions at this time.

## 2022-08-19 ENCOUNTER — Ambulatory Visit: Payer: Medicaid Other | Admitting: Physical Therapy

## 2022-08-19 ENCOUNTER — Encounter: Payer: Self-pay | Admitting: Physical Therapy

## 2022-08-19 DIAGNOSIS — M6281 Muscle weakness (generalized): Secondary | ICD-10-CM

## 2022-08-19 DIAGNOSIS — M25612 Stiffness of left shoulder, not elsewhere classified: Secondary | ICD-10-CM

## 2022-08-19 DIAGNOSIS — G8929 Other chronic pain: Secondary | ICD-10-CM

## 2022-08-19 DIAGNOSIS — M25512 Pain in left shoulder: Secondary | ICD-10-CM | POA: Diagnosis not present

## 2022-08-19 DIAGNOSIS — M5412 Radiculopathy, cervical region: Secondary | ICD-10-CM

## 2022-08-19 NOTE — Therapy (Signed)
OUTPATIENT PHYSICAL THERAPY TREATMENT NOTE   Patient Name: Abigail Molina MRN: [id number] DOB:07-19-1966, 56 y.o., female Today's Date: 08/19/2022  PCP: [facility] REFERRING PROVIDER: [place] Desanctis, MD  END OF SESSION:   PT End of Session - 08/19/22 1042     Visit Number 8    Number of Visits 24    Date for PT Re-Evaluation 09/30/22    Authorization Type [place] [employer] reporting period from 07/08/2022    Authorization Time Period max allowed 27 combined PT/OT/SLP, no auth required    Authorization - Visit Number 8    Authorization - Number of Visits 27    Progress Note Due on Visit 10    PT Start Time 1040    PT Stop Time 1118    PT Time Calculation (min) 38 min    Activity Tolerance Patient tolerated treatment well    Behavior During Therapy WFL for tasks assessed/performed              Past Medical History:  Diagnosis Date   Cancer ([place])    Endometrial cancer   Cirrhosis of liver ([place])    Depression    Diabetes mellitus without complication ([place])    Fatty liver disease, nonalcoholic 5643   GERD (gastroesophageal reflux disease)    Hyperlipidemia    Hypertension    Overactive bladder    Psoriasis 2015   Past Surgical History:  Procedure Laterality Date   ABDOMINAL HYSTERECTOMY  2007   CHOLECYSTECTOMY  2000   Patient Active Problem List   Diagnosis Date Noted   Abdominal pain 03/04/2018   Diabetes ([place]) 12/12/2015   Hyperlipidemia 12/12/2015   Hypertension 05/23/2015   Acid reflux 05/23/2015    REFERRING DIAG: adhesive capsulitis of left shoulder, left shoulder pain, unspecified chronicity  THERAPY DIAG:  Chronic left shoulder pain  Stiffness of left shoulder, not elsewhere classified  Muscle weakness (generalized)  Radiculopathy, cervical region  Rationale for Evaluation and Treatment: Rehabilitation  PERTINENT HISTORY: Patient is a 56 y.o. female who presents to outpatient physical therapy with a referral  for medical diagnosis adhesive capsulitis of left shoulder, left shoulder pain, unspecified chronicity. This patient's chief complaints consist of left shoulder pain, stiffness, and dysfunction leading to the following functional deficits: difficulty with anything requiring use of L UE such as lifting things, reaching, washing behind back, driving, sleeping. 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  Patient denies hx of stroke, seizures, lung problems, heart problems, unexplained changes in bowel or bladder problems, osteoporosis, and spinal surgery. Endorses history of unexplained weight loss, and stumbles and drops things all the time.   PRECAUTIONS: none  SUBJECTIVE: Patient reports she has had some small shooting pain here and there in her left shoulder over the weekend. She increased her reps to 15 for each of her exercise. 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 She is waiting to see the neurologist hopefully in  the next couple of weeks.   PAIN:  Are you having pain? No pain this morning. No paresthesia currently.   OBJECTIVE   TODAY'S TREATMENT  Therapeutic exercise: to centralize symptoms and improve ROM, strength, muscular endurance, and activity tolerance required for successful completion of functional activities.  - seated AAROM L shoulder  flexion and abduction with pulleys, 1x2 min each direction, 5 second holds. (Reports increased paresthesia during flexion, then abduction especially to the 4th and 5th digits of left hand).  - seated scapular rows at omega machine, 3x15 with 20#  Circuit:  - standing L shoulder IR with towel roll under arm, 3x15 with RTB.  - standing L shoulder ER with towel roll under arm, 3x15 with RTB  Circuit:  - standing B shoulder extension with scapular retraction, YTB anchored above head with handles, thumbs facing out. 3x15.  - seated L shoulder ER at propped at approx 80 degrees scaption, 3x10-15 with 1#DB through pain free range.   - standing cervical retraction with back against wall, 1x10 with 5 second holds  Pt required multimodal cuing for proper technique and to facilitate improved neuromuscular control, strength, range of motion, and functional ability resulting in improved performance and form.   PATIENT EDUCATION: Exercise purpose/form. Self management techniques. Reviewed cancelation/no-show policy with patient and confirmed patient has correct phone number for clinic; patient verbalized understanding (07/15/22). Person educated: Patient Education method: Explanation, demonstration, verbal and tactile cuing.  Education comprehension: verbalized understanding, demonstrated understanding and needs further education   HOME EXERCISE PROGRAM: Access Code: [email or web address] URL: [email or web address] Date: 08/19/2022 Prepared by: Rosita Kea  Exercises - Seated Shoulder Flexion AAROM with Pulley Behind  - 1 x daily - 1 sets - 20 reps - 5 seconds hold - Seated Shoulder Abduction AAROM with Pulley Behind  - 1 x daily - 1 sets - 20 reps - 5 seconds hold - Row with band/cable  - 1 x daily - 3 sets - 15 reps - 2 seconds hold - Shoulder External Rotation with Anchored Resistance  - 1 x daily - 3 sets - 15 reps - 2 seconds hold - Shoulder Internal Rotation with Resistance  - 1 x daily -  3 sets - 15 reps - 2 seconds hold - Shoulder Flexion Wall Slide with Resistance Band  - 1 x daily - 3 sets - 15 reps - 2 seconds hold - Seated Shoulder External Rotation in Abduction Supported with Dumbbell  - 1 x daily - 3 sets - 15 reps   ASSESSMENT:   CLINICAL IMPRESSION: Patient arrives with significantly improved pain in her left shoulder. Continued with shoulder girdle strengthening and postural exercises with gentle progression. Plan to continue with similar interventions as appropriate next session. Patient is still limited in use of L shoulder and has not yet achieved her maximum potential improvement. She had some paresthesia today in her left hand but not as bad. Patient would benefit from continued management of limiting condition by skilled physical therapist to address remaining impairments and functional limitations to work towards stated goals and return to PLOF or maximal functional independence.   From initial PT eval on 07/08/2022:  Patient is a 56 y.o. female referred to outpatient physical therapy with a medical diagnosis of adhesive capsulitis of left shoulder, left shoulder pain, unspecified chronicity who presents with signs and symptoms consistent with chronic L shoulder pain and stiffness and cervical radiculopathy affecting L UE especially in the C8 nerve root. Patient presents with positive special tests for rotator  cuff related pain in the left shoulder. She appears to have radicular symptoms as well as shoulder pain and dysfunction. Limited ROM without severe pain and guarding suggest late phases of adhesive capsulitis, which often responds to PT better than early stages and improves her prognosis with PT. Patient presents with significant pain, ROM, joint stiffness, muscle tension, motor control, muscle performance (strength/power/endurance) and activity tolerance impairments that are limiting ability to complete anything requiring use of L UE such as lifting things, reaching,  washing behind back, driving, sleeping without difficulty. Patient will benefit from skilled physical therapy intervention to address current body structure impairments and activity limitations to improve function and work towards goals set in current POC in order to return to prior level of function or maximal functional improvement.    OBJECTIVE IMPAIRMENTS decreased activity tolerance, decreased balance, decreased endurance, decreased knowledge of condition, decreased ROM, decreased strength, hypomobility, impaired perceived functional ability, increased muscle spasms, impaired flexibility, impaired UE functional use, improper body mechanics, obesity, and pain.    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    REHAB POTENTIAL: Good   CLINICAL DECISION MAKING: Stable/uncomplicated   EVALUATION COMPLEXITY: Low     GOALS: Goals reviewed with patient? No   SHORT TERM GOALS: Target  date: 07/22/2022   Patient will be independent with initial home exercise program for self-management of symptoms. Baseline: Initial HEP to be provided at visit 2 as appropriate (07/08/22); Goal status: met     LONG TERM GOALS: Target date: 09/30/2022   Patient will be independent with a long-term home exercise program for self-management of symptoms.  Baseline: Initial HEP to be provided at visit 2 as appropriate (07/08/22); Goal status: In-progress   2.  Patient will demonstrate improved FOTO to equal or greater than 52 by visit #9 to demonstrate improvement in overall condition and self-reported functional ability.  Baseline: 43 (07/08/22); 47 at visit # 5 (08/05/2022);  Goal status: In-progress   3.  Patient will demonstrate left shoulder AROM equal or greater than right shoulder AROM to be able to reach overhead and complete ADLs such as bathing with less difficulty.  Baseline: significantly limited - see objective exam  (07/08/22); Goal status: in-progress   4.  Patient will demonstrate L shoulder strength equal or greater than R shoulder strength with no increase in pain beyond 1/10 NPRS to demonstrate improved ability to push, pull, carry, and use L UE for functional tasks such as lifting and driving.  Baseline: significantly limited and painful (07/08/22); Goal status: In-progress   5.  Patient will complete community, work and/or recreational activities without limitation due to current condition.  Baseline: difficulty with anything  requiring use of L UE such as lifting things, reaching, washing behind back, driving, sleeping (96/89/57); Goal status: In-progress   PLAN: PT FREQUENCY: 1-2x/week   PT DURATION: 12 weeks   PLANNED INTERVENTIONS: Therapeutic exercises, Therapeutic activity, Neuromuscular re-education, Balance training, Patient/Family education, Self Care, Joint mobilization, Dry Needling, Electrical stimulation, Spinal mobilization, Cryotherapy, Moist heat, Manual  therapy, and Re-evaluation   PLAN FOR NEXT SESSION: update HEP as appropriate, manual therapy and exercises for improved ROM, shoulder girdle and postural strength as appropriate.     Nancy Nordmann, PT, DPT 08/19/2022, 11:18 AM  [facility] [address] [place], [place] [address] P: [phone] I F: [phone]

## 2022-08-22 DIAGNOSIS — L409 Psoriasis, unspecified: Principal | ICD-10-CM

## 2022-08-22 MED ORDER — HUMIRA PEN CITRATE FREE 40 MG/0.4 ML
SUBCUTANEOUS | 0 refills | 28 days
Start: 2022-08-22 — End: ?
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
## 2022-08-25 DIAGNOSIS — L409 Psoriasis, unspecified: Principal | ICD-10-CM

## 2022-08-25 MED ORDER — HUMIRA PEN CITRATE FREE 40 MG/0.4 ML
SUBCUTANEOUS | 0 refills | 28 days | Status: CP
Start: 2022-08-25 — End: ?
  Filled 2022-09-02: qty 2, 28d supply, fill #0

## 2022-08-25 MED FILL — INSULIN GLARGINE (U-100) 100 UNIT/ML (3 ML) SUBCUTANEOUS PEN: SUBCUTANEOUS | 22 days supply | Qty: 15 | Fill #3

## 2022-08-25 MED FILL — TRULICITY 1.5 MG/0.5 ML SUBCUTANEOUS PEN INJECTOR: SUBCUTANEOUS | 28 days supply | Qty: 4 | Fill #6

## 2022-08-25 MED FILL — CELECOXIB 200 MG CAPSULE: ORAL | 30 days supply | Qty: 60 | Fill #4

## 2022-08-25 NOTE — Unmapped (Unsigned)
[facility] Primary Care at [facility] Note:  [place], [place] [address]. Phone [phone]    08/25/2022    Patient Name:   Ashley Briggs    MRN: [id number]    Demographics:    Age-  56 y.o.     Date of Birth-  [birth year]    Chief complaint (CC):  No chief complaint on file.      Assessment/Plan:    Sharlot is a pleasant 56 y.o. female with PMHx of hepatic steatosis, T2DM w/ diabetic retinopathy, HTN, CAD, hepatic encephalopathy, PFD (pelvic floor dysfunction), HLD, psoriasis, obesity. Recently noted to have evidence of 2.4 x 1.4 x 0.7 cm calcified meningioma on MRI brain in the setting of bilateral headache and (resolved) epistaxis, is scheduled to meet with Neurosurgery 09/08/22. Here for ***, recommendations as below.      Diagnosis ICD-10-CM Associated Orders   1. Meningioma (CMS-HCC)  D32.9       2. 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 We discussed medical, dietary, lifestyle, and health maintenance modifications to optimize health. Standard precautions followed during visit. Medication adherence and barriers to the treatment plan have been addressed.  Patient voiced understanding, needs F/U visit as scheduled.    Health Maintenance:   Health Maintenance Due   Topic Date Due    Zoster Vaccines (1 of 2) Never done    Retinal Eye Exam  06/17/2022    Hemoglobin A1c  07/13/2022    COVID-19 Vaccine (4 - Mixed Product risk series) 08/11/2022    Foot Exam  09/12/2022     PHQ-2 Score:      PHQ-9 Score:      Inocente Salles Score:      {select_status_or_delete_smartlist:64641}    Subjective:    History of present illness (HPI):       Ashley Briggs is a 56 y.o. female who presented to [facility] for F/U DM2 and desire for ***. Patient's medical history was reviewed, see Past Medical History. Medications used in the past were reviewed, see medications. Patient reports eating and eliminating well. Pt is sleeping well. Patient {has/not:18111::has not} required emergency room treatment for these symptoms, and {has/not:18111::has not} required hospitalization.     Denies HA, fever, chest pain, shortness of breath, N/V/D, bowel or bladder issues, vision or hearing changes, or swelling.     Relevant ROS: Reviewed 12 systems, positive findings listed, all others negative.    Pertinent Past Med Hx:    Past Medical History:   Diagnosis Date    Anxiety     Blepharitis     Cancer (CMS-HCC)     endometrial 2007    Diabetes mellitus (CMS-HCC)     Diabetic retinopathy (CMS-HCC)     Mild NPDR w/ME, left eye    Endometrial cancer (CMS-HCC) 2007    Gastroparesis     Heart disease     High cholesterol     Hypertension     Liver disease     Non-alcoholic fatty liver disease     NPDR (nonproliferative diabetic retinopathy) (CMS-HCC)     Right eye    Psoriasis     Ptosis of eyelid, left     Left upper eyelid  Reflux        Medications:       Current Outpatient Medications:     ADALIMUMAB PEN CITRATE FREE 40 MG/0.4 ML, Inject 0.4 mL (40 mg total) under the skin every fourteen (14) days., Disp: 2 each, Rfl: 0    amitriptyline (ELAVIL) 10 MG tablet, , Disp: , Rfl:     aspirin (ECOTRIN) 81 MG tablet, Take 1 tablet (81 mg total) by mouth daily., Disp: 90 tablet, Rfl: 3    blood-glucose meter kit, Use as directed by provider., Disp: 1 each, Rfl: 0    canagliflozin (INVOKANA) 300 mg Tab tablet, Take 1 tablet (300 mg total) by mouth every morning before breakfast., Disp: 90 tablet, Rfl: 0    carvediloL (COREG) 6.25 MG tablet, Take 1 tablet (6.25 mg total) by mouth Two (2) times a day. Start with 1 tablet nightly, if tolerating well increase to twice daily, Disp: 180 tablet, Rfl: 3    celecoxib (CELEBREX) 200 MG capsule, Take 1 capsule (200 mg total) by mouth two (2) times a day as needed for pain., Disp: 180 capsule, Rfl: 1    citalopram (CELEXA) 40 MG tablet, Take 1 tablet (40 mg total) by mouth daily., Disp: , Rfl:     clonazePAM (KLONOPIN) 0.5 MG tablet, Take 1 tablet (0.5 mg total) by mouth nightly as needed for anxiety and sleep. May repeat dose once., Disp: 40 tablet, Rfl: 1    cyclobenzaprine (FLEXERIL) 5 MG tablet, Take 1-2 tablets by mouth every 8 hours as needed muscle pain or spasm., Disp: 30 tablet, Rfl: 1    dulaglutide (TRULICITY) 1.5 mg/0.5 mL PnIj, Inject the contents of 2 syringes (3 mg total) under the skin every seven (7) days., Disp: 4 mL, Rfl: 6    ferrous sulfate (SLOW FE) 142 mg (45 mg iron) TbER, Take 1 tablet (142 mg) by mouth two (2) times a day., Disp: 60 tablet, Rfl: 3    insulin glargine (LANTUS SOLOSTAR U-100 INSULIN) 100 unit/mL (3 mL) injection pen, Inject 0.66 mL (66 Units total) under the skin nightly., Disp: 15 mL, Rfl: 6    lactulose 10 gram/15 mL solution, Take 30 mL (20 g total) by mouth Three (3) times a day., Disp: 240 mL, Rfl: 0    lancets 30 gauge Misc, Use as directed to test once daily., Disp: 100 each, Rfl: 3    metFORMIN (GLUCOPHAGE-XR) 500 MG 24 hr tablet, Take 2 tablets (1,000 mg total) by mouth in the morning and 2 tablets (1,000 mg total) in the evening. Take with meals., Disp: 360 tablet, Rfl: 1    pantoprazole (PROTONIX) 40 MG tablet, Take 1 tablet (40 mg total) by mouth Two (2) times a day., Disp: 180 tablet, Rfl: 1    pen needle, diabetic 32 gauge x 1/4 (6 mm) Ndle, Use as directed to inject Lantus, Disp: 100 each, Rfl: 3    simvastatin (ZOCOR) 20 MG tablet, Take 1 tablet (20 mg total) by mouth every evening., Disp: 90 tablet, Rfl: 1     Allergies:   No Known Allergies    Pertinent Social Hx and Habits: EMR reviewed    Pertinent Family Hx: EMR reviewed    Objective:      BP Readings from Last 3 Encounters:   08/06/22 138/60   07/20/22 134/61   06/16/22 107/60        There were no vitals filed for this visit.     Physical Exam:    General Appearance: WDWN  in NAD Skin: W, D, I  HEENT: PERRLA, EOMI, TM's clear  Respiratory: Clear throughout  Cardio: RRR  Abdomen: Soft and non-tnder  Neurologic: A & O X 4, Grossly intact, stable gait  PSYCH: Behavior calm and cooperative    Diagnostics:     Lab Results   Component Value Date    WBC 7.3 07/20/2022    HGB 11.4 07/20/2022    HCT 34.9 07/20/2022    PLT 114 (L) 07/20/2022       Lab Results   Component Value Date    NA 137 07/20/2022    K 4.0 07/20/2022    CL 106 07/20/2022    CO2 19.7 (L) 07/20/2022    BUN 9 07/20/2022    CREATININE 0.49 (L) 07/20/2022    GLU 233 (H) 07/20/2022    CALCIUM 9.4 07/20/2022       Lab Results   Component Value Date    BILITOT 1.1 06/16/2022    BILIDIR 0.60 (H) 06/16/2022    PROT 7.2 06/16/2022    ALBUMIN 2.8 (L) 06/16/2022    ALT 34 06/16/2022    AST 56 (H) 06/16/2022    ALKPHOS 220 (H) 06/16/2022       Lab Results   Component Value Date    PT 13.2 (H) 06/16/2022    INR 1.16 06/16/2022    APTT 30.7 01/31/2011        No results found for: LIPID, TSH, A1CPCT     I attest that I, Benita Stabile, personally documented this note while acting as scribe for Olena Leatherwood, NP.      Benita Stabile, Scribe.  08/27/2022     The documentation recorded by the scribe accurately reflects the service I personally performed and the decisions made by me.     Olena Leatherwood, NP    Dr. Betha Loa, DNP, FNP-BC  [facility] at [facility] Certified Doctor of Nursing Practice   [phone]

## 2022-08-26 ENCOUNTER — Ambulatory Visit (INDEPENDENT_AMBULATORY_CARE_PROVIDER_SITE_OTHER): Payer: Medicaid Other | Admitting: Psychiatry

## 2022-08-26 ENCOUNTER — Encounter: Payer: Self-pay | Admitting: Psychiatry

## 2022-08-26 VITALS — BP 100/56 | HR 84 | Temp 97.1°F | Ht 64.0 in | Wt 249.0 lb

## 2022-08-26 DIAGNOSIS — F4010 Social phobia, unspecified: Secondary | ICD-10-CM

## 2022-08-26 DIAGNOSIS — G4700 Insomnia, unspecified: Secondary | ICD-10-CM | POA: Diagnosis not present

## 2022-08-26 DIAGNOSIS — F331 Major depressive disorder, recurrent, moderate: Secondary | ICD-10-CM

## 2022-08-26 MED ORDER — BUPROPION HCL ER (XL) 150 MG PO TB24: 150.0000 mg | ORAL_TABLET | Freq: Every day | ORAL | 1 refills | Status: DC

## 2022-08-26 MED ORDER — ZOLPIDEM TARTRATE 5 MG PO TABS: 5.0000 mg | ORAL_TABLET | Freq: Every evening | ORAL | 0 refills | Status: DC | PRN

## 2022-08-26 MED ORDER — CLONAZEPAM 0.5 MG PO TABS: 0.5000 mg | ORAL_TABLET | Freq: Every day | ORAL | 0 refills | Status: DC | PRN

## 2022-08-26 NOTE — Unmapped (Signed)
[facility] Refill Coordination Note    Ashley Briggs, [place]: 05-11-1966  Phone: There are no phone numbers on file.      All above HIPAA information was verified with patient.         08/26/2022    12:26 PM   Specialty Rx Medication Refill Questionnaire   Which Medications would you like refilled and shipped? Humira   Please list all current allergies: None   Have you missed any doses in the last 30 days? No   Have you had any changes to your medication(s) since your last refill? No   How many days remaining of each medication do you have at home? 0   If receiving an injectable medication, next injection date is 09/04/2022   Have you experienced any side effects in the last 30 days? No   Please enter the full address (street address, city, state, zip code) where you would like your medication(s) to be delivered to. [address], [place], [place] [address]   Please specify on which day you would like your medication(s) to arrive. Note: if you need your medication(s) within 3 days, please call the pharmacy to schedule your order at [phone]  09/02/2022   Has your insurance changed since your last refill? No   Would you like a pharmacist to call you to discuss your medication(s)? No   Do you require a signature for your package? (Note: if we are billing Medicare Part B or your order contains a controlled substance, we will require a signature) No   Additional Comments: Thank You         Completed refill call assessment today to schedule patient's medication shipment from the [facility] [phone]).  All relevant notes have been reviewed.       Confirmed patient received a Conservation officer, historic buildings and a Surveyor, mining with first shipment. The patient will receive a drug information handout for each medication shipped and additional FDA Medication Guides as required.         REFERRAL TO PHARMACIST     Referral to the pharmacist: Not needed      [facility]     Shipping address confirmed in Epic.     Delivery Scheduled: Yes, Expected medication delivery date: 09/02/2022.  However, Rx request for refills was sent to the provider as there are none remaining.     Medication will be delivered via Same Day Courier to the prescription address in Epic WAM.    Valere Dross   [facility] Specialty Technician

## 2022-08-27 ENCOUNTER — Ambulatory Visit
Admit: 2022-08-27 | Discharge: 2022-08-27 | Disposition: A | Discharge status: Home / Self Care | Payer: PRIVATE HEALTH INSURANCE

## 2022-08-27 DIAGNOSIS — H60502 Unspecified acute noninfective otitis externa, left ear: Principal | ICD-10-CM

## 2022-08-27 DIAGNOSIS — H9202 Otalgia, left ear: Principal | ICD-10-CM

## 2022-08-27 DIAGNOSIS — Z794 Long term (current) use of insulin: Principal | ICD-10-CM

## 2022-08-27 DIAGNOSIS — E113212 Type 2 diabetes mellitus with mild nonproliferative diabetic retinopathy with macular edema, left eye: Principal | ICD-10-CM

## 2022-08-27 MED ORDER — CANAGLIFLOZIN 300 MG TABLET
ORAL_TABLET | Freq: Every day | ORAL | 0 refills | 90 days
Start: 2022-08-27 — End: ?

## 2022-08-27 MED ORDER — CIPROFLOXACIN 0.3 %-DEXAMETHASONE 0.1 % EAR DROPS,SUSPENSION
Freq: Two times a day (BID) | OTIC | 0 refills | 19 days | Status: CP
Start: 2022-08-27 — End: ?
  Filled 2022-09-02: qty 7.5, 7d supply, fill #0

## 2022-08-27 NOTE — Unmapped (Signed)
Pt reports having L ear pain x1 week.

## 2022-08-27 NOTE — Unmapped (Signed)
[facility]  Emergency Department Provider Note        History     Chief Complaint  Ear Pain      HPI   Ashley Briggs is a 56 y.o. female with a past medical history of HTN, HLD, DM2, cirrhosis, gastroparesis, depression, anxiety, and recently diagnosed meningioma who presents to the ED for evaluation of L ear pain ongoing for 1 week. Patient states that she has been experiencing a fairly constant pain in her ear for the past week which has been refractory to tylenol and a home remedy of warm sweet oil. She denies any recent illness, drainage from the ear, or changes to hearing, but does endorse associated sensation of feeling off balance. She states she is unsure if the off balance sensation is related to her ear or the recent diagnosis of a meningioma. She denies any recent falls associated with the off balance sensation. She denies any fevers or congestion, but does endorse a mild sore throat when swallowing. She does report needing to have a few teeth pulled, but is still going through the medical clearance process for that procedure. She denies any other recent dental procedures.       Past Medical History:   Diagnosis Date    Anxiety     Blepharitis     Cancer (CMS-HCC)     endometrial 2007    Diabetes mellitus (CMS-HCC)     Diabetic retinopathy (CMS-HCC)     Mild NPDR w/ME, left eye    Endometrial cancer (CMS-HCC) 2007    Gastroparesis     Heart disease     High cholesterol     Hypertension     Liver disease     Non-alcoholic fatty liver disease     NPDR (nonproliferative diabetic retinopathy) (CMS-HCC)     Right eye    Psoriasis     Ptosis of eyelid, left     Left upper eyelid    Reflux        Patient Active Problem List   Diagnosis    PFD (pelvic floor dysfunction)    Type 2 diabetes mellitus, with long-term current use of insulin (CMS-HCC)    Hemorrhage of rectum and anus    Hyperlipidemia    Hypertension    Morbid obesity (CMS-HCC)    Dry eye syndrome, bilateral    Angular blepharoconjunctivitis of both eyes    Cataract associated with type 2 diabetes mellitus (CMS-HCC)    H/O diabetic gastroparesis    Retinopathy of left eye, background, proliferative    Coronary artery disease involving native heart without angina pectoris    Hepatic steatosis    Diabetes mellitus (CMS-HCC)    Diabetic retinopathy (CMS-HCC)    Ptosis of eyelid, left    Esophageal varices in cirrhosis (CMS-HCC)    Hepatic cirrhosis (CMS-HCC)    Secondary esophageal varices without bleeding (CMS-HCC)    Hepatic encephalopathy (CMS-HCC)    Hx of psoriasis       Past Surgical History:   Procedure Laterality Date    CHOLECYSTECTOMY      HYSTERECTOMY      endometrial cancer    HYSTERECTOMY      OOPHORECTOMY      PR ANAL PRESSURE RECORD Left 03/31/2013    Procedure: ANORECTAL MANOMETRY;  Surgeon: None None;  [facility];  Service: Gastroenterology    PR BREATH HYDROGEN TEST N/A 03/31/2013    Procedure: BREATH HYDROGEN TEST;  Surgeon: None None;  Location: [facility];  Service: Gastroenterology    PR COLSC FLX W/RMVL OF TUMOR POLYP LESION SNARE TQ N/A 09/06/2020    Procedure: COLONOSCOPY FLEX; W/REMOV TUMOR/LES BY SNARE;  Surgeon: Chriss Driver, MD;  Location: [facility];  Service: Gastroenterology    PR UPPER GI ENDOSCOPY,BIOPSY N/A 04/15/2013    Procedure: UGI ENDOSCOPY; WITH BIOPSY, SINGLE OR MULTIPLE;  Surgeon: Vickii Chafe, MD;  Location: [facility];  Service: Gastroenterology    PR UPPER GI ENDOSCOPY,BIOPSY N/A 09/06/2020    Procedure: UGI ENDOSCOPY; WITH BIOPSY, SINGLE OR MULTIPLE;  Surgeon: Chriss Driver, MD;  Location: [facility];  Service: Gastroenterology    PR UPPER GI ENDOSCOPY,BIOPSY N/A 05/05/2022    Procedure: UGI ENDOSCOPY; WITH BIOPSY, SINGLE OR MULTIPLE;  Surgeon: Chriss Driver, MD;  Location: [facility];  Service: Gastroenterology       No current facility-administered medications for this encounter.    Current Outpatient Medications:     amitriptyline (ELAVIL) 10 MG tablet, , Disp: , Rfl:     aspirin (ECOTRIN) 81 MG tablet, Take 1 tablet (81 mg total) by mouth daily., Disp: 90 tablet, Rfl: 3    blood-glucose meter kit, Use as directed by provider., Disp: 1 each, Rfl: 0    canagliflozin (INVOKANA) 300 mg Tab tablet, Take 1 tablet (300 mg total) by mouth every morning before breakfast., Disp: 90 tablet, Rfl: 0    carvediloL (COREG) 6.25 MG tablet, Take 1 tablet (6.25 mg total) by mouth Two (2) times a day. Start with 1 tablet nightly, if tolerating well increase to twice daily, Disp: 180 tablet, Rfl: 3    celecoxib (CELEBREX) 200 MG capsule, Take 1 capsule (200 mg total) by mouth two (2) times a day as needed for pain., Disp: 180 capsule, Rfl: 1    citalopram (CELEXA) 40 MG tablet, Take 1 tablet (40 mg total) by mouth daily., Disp: , Rfl:     clonazePAM (KLONOPIN) 0.5 MG tablet, Take 1 tablet (0.5 mg total) by mouth nightly as needed for anxiety and sleep. May repeat dose once., Disp: 40 tablet, Rfl: 1    cyclobenzaprine (FLEXERIL) 5 MG tablet, Take 1-2 tablets by mouth every 8 hours as needed muscle pain or spasm., Disp: 30 tablet, Rfl: 1    dulaglutide (TRULICITY) 1.5 mg/0.5 mL PnIj, Inject the contents of 2 syringes (3 mg total) under the skin every seven (7) days., Disp: 4 mL, Rfl: 6    ferrous sulfate (SLOW FE) 142 mg (45 mg iron) TbER, Take 1 tablet (142 mg) by mouth two (2) times a day., Disp: 60 tablet, Rfl: 3    HUMIRA PEN CITRATE FREE 40 MG/0.4 ML, Inject 0.4 mL (40 mg total) under the skin every fourteen (14) days., Disp: 2 each, Rfl: 0    insulin glargine (LANTUS SOLOSTAR U-100 INSULIN) 100 unit/mL (3 mL) injection pen, Inject 0.66 mL (66 Units total) under the skin nightly., Disp: 15 mL, Rfl: 6    lactulose 10 gram/15 mL solution, Take 30 mL (20 g total) by mouth Three (3) times a day., Disp: 240 mL, Rfl: 0    lancets 30 gauge Misc, Use as directed to test once daily., Disp: 100 each, Rfl: 3    metFORMIN (GLUCOPHAGE-XR) 500 MG 24 hr tablet, Take 2 tablets (1,000 mg total) by mouth in the morning and 2 tablets (1,000 mg total) in the evening. Take with meals., Disp: 360 tablet, Rfl: 1  pantoprazole (PROTONIX) 40 MG tablet, Take 1 tablet (40 mg total) by mouth Two (2) times a day., Disp: 180 tablet, Rfl: 1    pen needle, diabetic 32 gauge x 1/4 (6 mm) Ndle, Use as directed to inject Lantus, Disp: 100 each, Rfl: 3    simvastatin (ZOCOR) 20 MG tablet, Take 1 tablet (20 mg total) by mouth every evening., Disp: 90 tablet, Rfl: 1    Allergies  Patient has no known allergies.    Family History   Problem Relation Age of Onset    Diabetes Mother     Cancer Mother     No Known Problems Father     No Known Problems Sister     Diabetes Maternal Grandmother     Hypertension Maternal Grandmother     Cancer Maternal Grandfather     Diabetes Maternal Grandfather     No Known Problems Paternal Grandmother     Skin cancer Paternal Grandfather     No Known Problems Brother     No Known Problems Maternal Aunt     No Known Problems Maternal Uncle     No Known Problems Paternal Aunt     No Known Problems Paternal Uncle     No Known Problems Other     Melanoma Neg Hx     Basal cell carcinoma Neg Hx     Squamous cell carcinoma Neg Hx     Substance Abuse Disorder Neg Hx     Alcohol abuse Neg Hx     Drug abuse Neg Hx     Mental illness Neg Hx     Amblyopia Neg Hx     Blindness Neg Hx     Cataracts Neg Hx     Glaucoma Neg Hx     Macular degeneration Neg Hx     Retinal detachment Neg Hx     Strabismus Neg Hx     Stroke Neg Hx     Thyroid disease Neg Hx     Breast cancer Neg Hx        Social History  Social History     Tobacco Use    Smoking status: Never     Passive exposure: Current    Smokeless tobacco: Never   Vaping Use    Vaping Use: Never used   Substance Use Topics    Alcohol use: Not Currently     Comment: No longer    Drug use: Never       Review of Systems    A complete review of systems was performed and is negative other than as addressed in the HPI.    Physical Exam     ED Triage Vitals [08/27/22 1229]   Enc Vitals Group      BP 126/76      Heart Rate 91      SpO2 Pulse       Resp 16      Temp 37 ??C (98.6 ??F)      Temp Source Oral      SpO2 96 %      Weight       Height       Head Circumference       Peak Flow       Pain Score       Pain Loc       Pain Edu?       Excl. in GC?        Constitutional: Alert and oriented. Well appearing  and in no distress.  Eyes: No scleral icterus.  ENT       Head: Normocephalic and atraumatic.       Ear: EAC with mild cerumen observed without erythema or inflammation bilaterally. TMs pearly grey appearing without erythema, fluid bubbles, or bulging. Tender to palpation of tragus and mild tenderness with insertion of otoscope, no tenderness to pinna or mastoid process.        Mouth/Throat: Several widespread ulcerations visible throughout the mouth, with one noted to be on the upper posterior gum line behind the back molar. Throat without erythema or exudate. Mucous membranes are moist.       Neck: Supple, no palpable lymphadenopathy  Cardiovascular: Normal rate, extremities well perfused. RRR without murmurs, gallops, or rubs on cardiac auscultation.   Respiratory: Normal respiratory effort. Good tidal volume. Lungs CTAB   Musculoskeletal: Patient freely moves all extremities. No warm or swollen joints.   Neurologic: Normal speech and language. No gross focal neurologic deficits are appreciated.  Skin: Skin is warm, dry and intact. No rash noted.  Psychiatric: Mood and affect are normal. Speech and behavior are normal.    EKG   N/a    Radiology     No orders to display         Labs         Pertinent labs & imaging results that were available during my care of the patient were reviewed by me and considered in my medical decision making (see chart for details).      ED Clinical Impression     No diagnosis found.      ED Course, Assessment and Plan     Patient physical exam notable for tenderness to the tragus and mild tenderness at the canal with insertion of otoscope. Given presentation, discussed with patient treating with antibiotic ear drops as early otitis externa is suspected. Recommended follow up with PCP if no improvement with drops or following up with dentist if dental pain presents. Patient amenable to treatment plan, will discharge at this time.    Medications - No data to display      Discharge Medications:  New Prescriptions    No medications on file        ____________________________________________  Disposition: Discharge home.     After careful consideration of Ashley Briggs presentation and clinical course, there does not appear to be an indication for further emergent evaluation or intervention, nor is there an indication for admission to the hospital.  At the time of discharge I do not believe, to the best of my medical judgment, that an emergancy medical condition exists.  Ashley Briggs is discharged home in stable and satisfactory condition.  Discharge diagnosis, instructions and plan were discussed and understood.  Signs and symptoms that should prompt re-evaluation in the emergency department were discussed and understood.  Both verbal and written discharge instructions were provided.      Additional Medical Decision Making     I have reviewed the vital signs and the nursing notes. Labs and radiology results that were available during my care of the patient were independently reviewed by me and considered in my medical decision making.        I have reviewed the triage vital signs and the nursing notes.    Casimer Bilis, PA-S       Helyn App, [place]  08/27/22 1400

## 2022-08-28 MED ORDER — CANAGLIFLOZIN 300 MG TABLET
ORAL_TABLET | Freq: Every day | ORAL | 0 refills | 30 days | Status: CP
Start: 2022-08-28 — End: ?
  Filled 2022-09-02: qty 30, 30d supply, fill #0

## 2022-08-28 NOTE — Unmapped (Signed)
Patient is requesting the following refill  Requested Prescriptions     Pending Prescriptions Disp Refills    canagliflozin (INVOKANA) 300 mg Tab tablet 30 tablet 0     Sig: Take 1 tablet (300 mg total) by mouth every morning before breakfast.       Recent Visits  Date Type Provider Dept   08/06/22 Office Visit Deneise Lever, FNP [facility] [address] [facility]   03/12/22 Office Visit Pou, Inetta Fermo, MD [facility]   03/06/22 Office Visit Farrug, Emogene Morgan, NP [facility] [address] [facility]   02/11/22 Office Visit Farrug, Emogene Morgan, NP [facility]   01/13/22 Office Visit Farrug, Emogene Morgan, NP [facility]   09/12/21 Office Visit Farrug, Emogene Morgan, NP [facility]   Showing recent visits within past 365 days with a meds authorizing provider and meeting all other requirements  Future Appointments  Date Type Provider Dept   09/02/22 Appointment Deneise Lever, FNP [facility] [address] [facility]   09/11/22 Appointment Pou, Inetta Fermo, MD [facility]   Showing future appointments within next 365 days with a meds authorizing provider and meeting all other requirements       Labs: A1c:   Hemoglobin A1C (%)   Date Value   01/13/2022 6.4 (A)    Creatinine:   Creatinine   Date Value   07/20/2022 0.49 mg/dL (L)   16/09/9603 5.40 MG/DL       Bridge order provided until patient's next appointment date. No further concerns at this moment.

## 2022-09-01 ENCOUNTER — Ambulatory Visit
Admit: 2022-09-01 | Discharge: 2022-09-02 | Payer: PRIVATE HEALTH INSURANCE | Attending: Student in an Organized Health Care Education/Training Program | Primary: Student in an Organized Health Care Education/Training Program

## 2022-09-01 DIAGNOSIS — M2550 Pain in unspecified joint: Principal | ICD-10-CM

## 2022-09-01 NOTE — Unmapped (Signed)
RHEUMATOLOGY CLINIC INITIAL VISIT NOTE    Reason for consult: Ashley Briggs is seen in consultation at the request of Dr. Deneise Lever for evaluation of arthralgias in the setting of psoriasis.     HPI: Ashley Briggs is a 56 y.o.  female with a PMH of Hypertension, CAD, Hyperlipidemia, Diabetes type 2 and Psoriasis who presents with initial evaluation of polyarthralgia's. She was diagnosed with Psoriasis over 5 years ago and was initially treated with topicals. She had involvement of her back which spread to her legs with over 20% BSA. She was started on Cosentyx in 2020 and significantly improved her skin. At that time she only noticed mild joint pain in her fingers. Then within a year on Cosentyx she developed knee pain. There was a lapse in her insurance and she was off Cosentyx for 4-5 months. Cosentyx was then resumed without the loading doses and the patient did not believe it was as effective. She was then switched to [facility] in February but did not have any improvement in symptoms and it was discontinued in June. Additionally her bilateral knee pain continued to progress while on Stelara. Of note, knee X-rays reveal moderate to severe osteoarthritis and chondrocalcinosis. The patient reports continuous knee pain that is worse with prolonged walking and swell quite often. Does have minimal morning stiffness in her knees and was told that she will need a joint replacement. Denies any acute intermittent flares. Knee pain is persistent, however left is worst than right. She has tried Celebrex in the past and also underwent knee injections that are no longer helpful. Patient worse with increased walking.The patient notes that she also has difficulty with her left shoulder since she fractured it in 2020. She did not move her arm freely since  her fracture and would have intermittent worsening of pain. Her left shoulder became more bothersome in the last 6 months. She had a shoulder injection in July that provided an little relief and is now undergoing physical therapy for her right shoulder with moderate improvement in range of motion and pain. She will occasionally take Tylenol for pain but tries not to take it every day.     Today the patient notes that the pain is manageable. She has never tried physical therapy in the past for her knees. However denies any improvement in joint pain on Humira or Stelara. Does not have any joint swelling currently. She has active psoriatic lesions on her back, abdomen, lower extremities, and scalp which has not improved on Humira. Yet lesions are no longer pruritic. Denies any occular symptoms, nail changes, fever, wieght changes, or fatigue. No back or hip pain. She has occasional hand pain worse with activities such as writing.     ROS: 14 systems were reviewed and are negative except for that mentioned in the HPI.     Allergies: Patient has no known allergies.  Immunizations:   Immunization History   Administered Date(s) Administered   ??? COVID-19 VAC,BIVALENT(67YR UP),PFIZER 06/16/2022   ??? COVID-19 VACCINE,MRNA(MODERNA)(PF) 05/30/2020, 06/28/2020   ??? Hep A / Hep B 07/08/2022, 08/08/2022   ??? INFLUENZA INJ MDCK PF, QUAD,(FLUCELVAX)(27MO AND UP EGG FREE) 08/22/2020, 09/12/2021   ??? Influenza Vaccine Quad (IIV4 PF) 71mo+ injectable 11/10/2019, 08/06/2022   ??? Influenza Virus Vaccine, unspecified formulation 09/12/2016, 09/10/2017   ??? PNEUMOCOCCAL POLYSACCHARIDE 23 11/10/2019   ??? PPD Test 10/17/2019   ??? Pneumococcal Conjugate 20-valent 06/16/2022   ??? TdaP 03/11/2007, 05/23/2020, 05/24/2021      PMHx:  Past Medical History:   Diagnosis Date   ??? Anxiety    ??? Blepharitis    ??? Cancer (CMS-HCC)     endometrial 2007   ??? Diabetes mellitus (CMS-HCC)    ??? Diabetic retinopathy (CMS-HCC)     Mild NPDR w/ME, left eye   ??? Endometrial cancer (CMS-HCC) 2007   ??? Gastroparesis    ??? Heart disease    ??? High cholesterol    ??? Hypertension    ??? Liver disease    ??? Non-alcoholic fatty liver disease    ??? 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 Psoriasis    ??? Ptosis of eyelid, left     Left upper eyelid   ??? Reflux      PSHx:   Past Surgical History:   Procedure Laterality Date   ??? CHOLECYSTECTOMY     ??? HYSTERECTOMY      endometrial cancer   ??? HYSTERECTOMY     ??? OOPHORECTOMY     ??? PR ANAL PRESSURE RECORD Left 03/31/2013    Procedure: ANORECTAL MANOMETRY;  Surgeon: None None;  [facility];  Service: Gastroenterology   ??? PR BREATH HYDROGEN TEST N/A 03/31/2013    Procedure: BREATH HYDROGEN TEST;  Surgeon: None None;  Location: [facility];  Service: Gastroenterology   ??? PR COLSC FLX W/RMVL OF TUMOR POLYP LESION SNARE TQ N/A 09/06/2020    Procedure: COLONOSCOPY FLEX; W/REMOV TUMOR/LES BY SNARE;  Surgeon: Chriss Driver, MD;  Location: [facility];  Service: Gastroenterology   ??? PR UPPER GI ENDOSCOPY,BIOPSY N/A 04/15/2013    Procedure: UGI ENDOSCOPY; WITH BIOPSY, SINGLE OR MULTIPLE;  Surgeon: Vickii Chafe, MD;  Location: [facility];  Service: Gastroenterology   ??? PR UPPER GI ENDOSCOPY,BIOPSY N/A 09/06/2020    Procedure: UGI ENDOSCOPY; WITH BIOPSY, SINGLE OR MULTIPLE;  Surgeon: Chriss Driver, MD;  [facility]: [facility];  Service: Gastroenterology   ??? PR UPPER GI ENDOSCOPY,BIOPSY N/A 05/05/2022    Procedure: UGI ENDOSCOPY; WITH BIOPSY, SINGLE OR MULTIPLE;  Surgeon: Chriss Driver, MD;  Location: [facility];  Service: Gastroenterology     Family Hx:   Family History   Problem Relation Age of Onset   ??? Diabetes Mother    ??? Cancer Mother    ??? No Known Problems Father    ??? No Known Problems Sister    ??? Diabetes Maternal Grandmother    ??? Hypertension Maternal Grandmother    ??? Cancer Maternal Grandfather    ??? Diabetes Maternal Grandfather    ??? No Known Problems Paternal Grandmother    ??? Skin cancer Paternal Grandfather    ??? No Known Problems Brother    ??? No Known Problems Maternal Aunt    ??? No Known Problems Maternal Uncle    ??? No Known Problems Paternal Aunt    ??? No Known Problems Paternal Uncle    ??? No Known Problems Other    ??? Melanoma Neg Hx    ??? Basal cell carcinoma Neg Hx    ??? Squamous cell carcinoma Neg Hx    ??? Substance Abuse Disorder Neg Hx    ??? Alcohol abuse Neg Hx    ??? Drug abuse Neg Hx    ??? Mental illness Neg Hx    ??? Amblyopia Neg Hx    ??? Blindness Neg Hx    ??? Cataracts Neg Hx    ???  Glaucoma Neg Hx    ??? Macular degeneration Neg Hx    ??? Retinal detachment Neg Hx    ??? Strabismus Neg Hx    ??? Stroke Neg Hx    ??? Thyroid disease Neg Hx    ??? Breast cancer Neg Hx      Social Hx:   Social History     Socioeconomic History   ??? Marital status: Widowed   Tobacco Use   ??? Smoking status: Never     Passive exposure: Current   ??? Smokeless tobacco: Never   Vaping Use   ??? Vaping Use: Never used   Substance and Sexual Activity   ??? Alcohol use: Not Currently     Comment: No longer   ??? Drug use: Never   Other Topics Concern   ??? Do you use sunscreen? No   ??? Tanning bed use? No   ??? Are you easily burned? Yes   ??? Excessive sun exposure? No   ??? Blistering sunburns? Yes   Social History Narrative    ** Merged History Encounter **          Social Determinants of Health     Financial Resource Strain: Medium Risk (01/13/2022)    Overall Financial Resource Strain (CARDIA)    ??? Difficulty of Paying Living Expenses: Somewhat hard   Food Insecurity: Food Insecurity Present (01/13/2022)    Hunger Vital Sign    ??? Worried About Running Out of Food in the Last Year: Sometimes true    ??? Ran Out of Food in the Last Year: Sometimes true   Transportation Needs: No Transportation Needs (01/13/2022)    PRAPARE - Transportation    ??? Lack of Transportation (Medical): No    ??? Lack of Transportation (Non-Medical): No   Physical Activity: Insufficiently Active (01/09/2021)    Exercise Vital Sign    ??? Days of Exercise per Week: 1 day    ??? Minutes of Exercise per Session: 40 min       Medications:   Current Outpatient Medications   Medication Sig Dispense Refill   ??? amitriptyline (ELAVIL) 10 MG tablet      ??? aspirin (ECOTRIN) 81 MG tablet Take 1 tablet (81 mg total) by mouth daily. 90 tablet 3   ??? blood-glucose meter kit Use as directed by provider. 1 each 0   ??? canagliflozin (INVOKANA) 300 mg Tab tablet Take 1 tablet (300 mg total) by mouth every morning before breakfast. 30 tablet 0   ??? carvediloL (COREG) 6.25 MG tablet Take 1 tablet (6.25 mg total) by mouth Two (2) times a day. Start with 1 tablet nightly, if tolerating well increase to twice daily 180 tablet 3   ??? celecoxib (CELEBREX) 200 MG capsule Take 1 capsule (200 mg total) by mouth two (2) times a day as needed for pain. 180 capsule 1   ??? ciprofloxacin-dexAMETHasone (CIPRODEX) 0.3-0.1 % otic suspension Administer 4 drops into the left ear Two (2) times a day. 7.5 mL 0   ??? citalopram (CELEXA) 40 MG tablet Take 1 tablet (40 mg total) by mouth daily.     ??? clonazePAM (KLONOPIN) 0.5 MG tablet Take 1 tablet (0.5 mg total) by mouth nightly as needed for anxiety and sleep. May repeat dose once. 40 tablet 1   ??? cyclobenzaprine (FLEXERIL) 5 MG tablet Take 1-2 tablets by mouth every 8 hours as needed muscle pain or spasm. 30 tablet 1   ??? dulaglutide (TRULICITY) 1.5 mg/0.5 mL PnIj Inject the contents  of 2 syringes (3 mg total) under the skin every seven (7) days. 4 mL 6   ??? ferrous sulfate (SLOW FE) 142 mg (45 mg iron) TbER Take 1 tablet (142 mg) by mouth two (2) times a day. 60 tablet 3   ??? HUMIRA PEN CITRATE FREE 40 MG/0.4 ML Inject the contents of 1 pen (40 mg total) under the skin every fourteen (14) days. 2 each 0   ??? insulin glargine (LANTUS SOLOSTAR U-100 INSULIN) 100 unit/mL (3 mL) injection pen Inject 0.66 mL (66 Units total) under the skin nightly. 15 mL 6   ??? lactulose 10 gram/15 mL solution Take 30 mL (20 g total) by mouth Three (3) times a day. 240 mL 0   ??? lancets 30 gauge Misc Use as directed to test once daily. 100 each 3   ??? metFORMIN (GLUCOPHAGE-XR) 500 MG 24 hr tablet Take 2 tablets (1,000 mg total) by mouth in the morning and 2 tablets (1,000 mg total) in the evening. Take with meals. 360 tablet 1   ??? pantoprazole (PROTONIX) 40 MG tablet Take 1 tablet (40 mg total) by mouth Two (2) times a day. 180 tablet 1   ??? pen needle, diabetic 32 gauge x 1/4 (6 mm) Ndle Use as directed to inject Lantus 100 each 3   ??? simvastatin (ZOCOR) 20 MG tablet Take 1 tablet (20 mg total) by mouth every evening. 90 tablet 1     No current facility-administered medications for this visit.       Physical Exam:    Vitals:    09/01/22 1303   BP: 115/57   BP Site: L Arm   BP Position: Sitting   BP Cuff Size: X-Large   Pulse: 82   Temp: 36.8 ??C (98.2 ??F)   TempSrc: Temporal   Weight: (!) 114.7 kg (252 lb 12.8 oz)     GEN:  In no acute distress.  Well nourished and well kempt.  Head: No alopecia. [place]/AT.  Eyes: EOMI. PERRL. No conjunctival injection.  Ears: Normal external exam. Normal TM's.  Nose/Oral:  No eyrthema in the nares or nasal polyps.  Clear oropharynx. No oral ulcers.   Neck:  Supple, no JVD.  No thyromegaly.  No carotid bruit.   Lymph: No lymphadenopathy of cervical, supraclavicular, axillary or inguinal areas.  Chest: Good respiratory effort with symmetrical chest expansion.  CTAB.  Cardiovascular: RRR, normal s1/s2, no murmurs, rubs, or gallops appreciated.  2+ distal pulses equal bilaterally.  GI:  Soft, NT, ND, normal bowel sounds.  No organomegaly.  No abdominal bruit.  MSK:     Hands: Non-tender. No swelling/synovitis. Full ROM. Able to make fist.    Wrists: Non-tender. No swelling/synovitis. Full ROM.    Elbows: Non-tender. No swelling/synovitis. Full ROM.    Shoulders: Tender left shoulder with decreased active abduction. Full passive ROM.    Neck: Non-tender to mid-line palpation. Full ROM.    Back: Non-tender to mid-line palpation & SI joints. No deformities. Full ROM.    Hips: Non-tender. Full ROM.    Knees: Left knee tenderness. No swelling/synovitis. Full ROM.    Ankles: Non-tender. No swelling/synovitis. Full ROM.    Feet: Non-tender. No swelling/synovitis. Full ROM.   Neuro:  AAO x 3.  No focal neurologic deficits. Strength 5/5 bilaterally in upper and lower extremities.  Skin:  No rashes, nodules or dry skin noted.  No petichiae or purpura.  No cyanosis or clubbing.  Psych: Normal mood and affect. Normal thought process.  Labs:    Lab Results   Component Value Date    CRP 10.0 07/20/2022    ESR 53 (H) 07/20/2022    RF <3.5 07/20/2022       Lab Results   Component Value Date    CREATUR 57.3 02/11/2022           Lab Results   Component Value Date    RF <3.5 07/20/2022       Lab Results   Component Value Date    HBSAG Nonreactive 06/16/2022    HEPBSAB Nonreactive 06/16/2022    QFTTBGOLD Negative 06/16/2022    HEPCAB Nonreactive 06/16/2022       Lab Results   Component Value Date    NITRITE Negative 03/06/2022    PROTEINUA Negative 05/14/2020    RBCUA 1 05/14/2020    WBC 7.3 07/20/2022    BLOODU Negative 05/14/2020    KETONESU Negative 05/14/2020       Lab Results   Component Value Date    WBC 7.3 07/20/2022    HGB 11.4 07/20/2022    HCT 34.9 07/20/2022    PLT 114 (L) 07/20/2022       Lab Results   Component Value Date    NA 137 07/20/2022    K 4.0 07/20/2022    CL 106 07/20/2022    CO2 19.7 (L) 07/20/2022    BUN 9 07/20/2022    CREATININE 0.49 (L) 07/20/2022    GLU 233 (H) 07/20/2022    CALCIUM 9.4 07/20/2022       Lab Results   Component Value Date    BILITOT 1.1 06/16/2022    BILIDIR 0.60 (H) 06/16/2022    PROT 7.2 06/16/2022    ALBUMIN 2.8 (L) 06/16/2022    ALT 34 06/16/2022    AST 56 (H) 06/16/2022    ALKPHOS 220 (H) 06/16/2022       Lab Results   Component Value Date    PT 13.2 (H) 06/16/2022    INR 1.16 06/16/2022    APTT 30.7 01/31/2011       Imaging:    Left knee X-ray    Impression   1.Tricompartmental osteoarthrosis of left knee, severe in the medial compartment.   2.Subchondral lucencies seen in the left knee medial femoral condyle which could represent developing insufficiency fracture. No evidence of articular surface collapse.   3.Mild right knee osteoarthrosis.          Right Knee X-ray    Impression   Moderate medial femorotibial predominant tricompartmental right knee osteoarthrosis with chondrocalcinosis and intra-articular bodies.       Assessment and Plan: Ashley Briggs is a 56 y.o.  female with a PMH of  Hypertension, CAD, Hyperlipidemia, Diabetes type 2 and Psoriasis who presents with initial evaluation of polyarthralgia's. The patient has a longstanding history of both psoriasis and joint pain. Often experiences morning stiffness and joint swelling. However joint pain worsens with use and prolonged activities. Patient notes that her left has worsened in the last couple of months, but has improved with physical therapy.  The patient has been on multiple therapies such as Cosentyx and Stelara and continued to have progression of her joint pain. Multiple x-rays have been notable for osteoarthritis with the most recent right knee with worsening osteoarthrosis and chondrocalcinosis. However the patient denies intermittent  flaring of symptoms and states that her left knee feels worse than the right. She has been on Humira since July and denies any improvement in her joint symptoms.  Her skin involvement has not significantly improved on Humira as well, but pruritis has resolved. There is no evidence of swelling on exam. Given lack of improvement of joint symptoms on Humira, the is low suspicion of an inflammatory arthritis at this time. There is chondrocalcinosis noted on X-ray of her right knee. However pain has remained persistent. She plans to pursue knee replacement.  The patient will let me know if the pain worsens.       1. Polyarthralgia's   No evidence of inflammatory arthritis on exam.  --No indication for further Rheumatologic work up   --the patient would like to continue with conservative pain therapy  --Continue with Tylenol and Voltaren gel as needed  --discussed physical therapy as conservative treatment   --The patient will let me know if she has an acute flare of symptoms, discusses having her come in for an evaluation         Patient was seen and discussed with Dr. Berton Lan     RTC as needed     Ashley Kim, MD, PGY-5  [facility]  [phone]

## 2022-09-02 ENCOUNTER — Ambulatory Visit: Payer: Medicaid Other | Admitting: Physical Therapy

## 2022-09-02 ENCOUNTER — Ambulatory Visit: Admit: 2022-09-02 | Discharge: 2022-09-03 | Payer: PRIVATE HEALTH INSURANCE

## 2022-09-02 DIAGNOSIS — Z794 Long term (current) use of insulin: Principal | ICD-10-CM

## 2022-09-02 DIAGNOSIS — E113212 Type 2 diabetes mellitus with mild nonproliferative diabetic retinopathy with macular edema, left eye: Principal | ICD-10-CM

## 2022-09-02 DIAGNOSIS — Z09 Encounter for follow-up examination after completed treatment for conditions other than malignant neoplasm: Principal | ICD-10-CM

## 2022-09-02 DIAGNOSIS — K7460 Unspecified cirrhosis of liver: Principal | ICD-10-CM

## 2022-09-02 DIAGNOSIS — I8510 Secondary esophageal varices without bleeding: Principal | ICD-10-CM

## 2022-09-02 LAB — CBC W/ AUTO DIFF
BASOPHILS ABSOLUTE COUNT: 0 10*9/L (ref 0.0–0.1)
BASOPHILS RELATIVE PERCENT: 0.3 %
EOSINOPHILS ABSOLUTE COUNT: 0.1 10*9/L (ref 0.0–0.5)
EOSINOPHILS RELATIVE PERCENT: 2.6 %
HEMATOCRIT: 31.8 % — ABNORMAL LOW (ref 34.0–44.0)
HEMOGLOBIN: 10.1 g/dL — ABNORMAL LOW (ref 11.3–14.9)
LYMPHOCYTES ABSOLUTE COUNT: 0.8 10*9/L — ABNORMAL LOW (ref 1.1–3.6)
LYMPHOCYTES RELATIVE PERCENT: 22.7 %
MEAN CORPUSCULAR HEMOGLOBIN CONC: 31.7 g/dL — ABNORMAL LOW (ref 32.0–36.0)
MEAN CORPUSCULAR HEMOGLOBIN: 24.8 pg — ABNORMAL LOW (ref 25.9–32.4)
MEAN CORPUSCULAR VOLUME: 78.4 fL (ref 77.6–95.7)
MEAN PLATELET VOLUME: 8.6 fL (ref 6.8–10.7)
MONOCYTES ABSOLUTE COUNT: 0.3 10*9/L (ref 0.3–0.8)
MONOCYTES RELATIVE PERCENT: 8.6 %
NEUTROPHILS ABSOLUTE COUNT: 2.4 10*9/L (ref 1.8–7.8)
NEUTROPHILS RELATIVE PERCENT: 65.8 %
PLATELET COUNT: 150 10*9/L (ref 150–450)
RED BLOOD CELL COUNT: 4.06 10*12/L (ref 3.95–5.13)
RED CELL DISTRIBUTION WIDTH: 16.3 % — ABNORMAL HIGH (ref 12.2–15.2)
WBC ADJUSTED: 3.6 10*9/L (ref 3.6–11.2)

## 2022-09-02 LAB — SLIDE REVIEW

## 2022-09-02 LAB — PROTIME-INR
INR: 1.16
PROTIME: 13.2 s — ABNORMAL HIGH (ref 9.8–12.8)

## 2022-09-02 NOTE — Unmapped (Signed)
Patient requested during check out to change PCPs from [place] to [place]. I told patient I would send a message and update with her along with scheduling her 3-4 month follow up. Please advise.

## 2022-09-02 NOTE — Unmapped (Signed)
Assessment and Plan:     Ashley Briggs was seen today for follow-up.    Diagnoses and all orders for this visit:  Follow-up exam    -Pt tolerated colonoscopy and endoscopy without adverse effect  -Walking dog most days without CP, SOB, fatigue  -Cardiac echo: 6/22: The left ventricular systolic function is normal, LVEF is visually  estimated at 60-65%.  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       - Upcoming neurosurgery appointment, has been referred to neurology    Barriers to recommended plan: None identified    Return if symptoms worsen or fail to improve, for 3-4 months.      Subjective:     HPI: Ashley Briggs is a 56 y.o. female here for Follow-up.    Clearance for dental treatment:    Needs extractions x 5 done, thinks will have conscious sedation, uncertain if will need GA via [facility], date for procedure not schedule. Did have colonoscopy done 2021-tolerated conscious sedation without problem. Walks dog most days without chest pains, shortness of breath, dizziness, syncope, fatigue  Echo (6/22): The left ventricular systolic function is normal, LVEF is visually  estimated at 60-65%.    Hx of liver cirrhosis w/varices noted on EGD (6/23):  Cirrhosis: likely secondary to metabolic syndrome associated steatohepatitis   Seen by liver clinic 07/23  Platelet count today 150, hemoglobin 10.1, hematocrit 31.8  PT 13.2, INR 1.16    Hx of DM:  Pt states hx of depression, when mood low-emotional eater  As a result, pt feels her A1c will elevated  9.8--6.4--8.9  Has upcoming endocrinology visit  Taking dulaglutide, invokana, metformin, lantus  No problems with feet    Headaches:  Ongoing, was referred to neurology  Head MRI:   -No acute intracranial abnormality.      --Likely calcified meningioma along planum sphenoidale.   Has upcoming neurosurgery follow-up        I have reviewed past medical, surgical, medications, allergies, social and family histories today and updated them in Epic where appropriate.    ROS:       Review of systems negative unless otherwise noted as per HPI.      Objective:     Vitals:    09/02/22 1303   BP: 128/70   Pulse: 84   Temp: 37.2 ??C (98.9 ??F)   SpO2: 97%     Body mass index is 43.62 kg/m??.    Physical Exam  Vitals and nursing note reviewed.   Constitutional:  Appearance: Normal appearance.   Cardiovascular:      Rate and Rhythm: Normal rate and regular rhythm.      Pulses: Normal pulses.      Heart sounds: Normal heart sounds.   Pulmonary:      Effort: Pulmonary effort is normal.      Breath sounds: Normal breath sounds.   Abdominal:      General: Abdomen is flat. Bowel sounds are normal.      Palpations: Abdomen is soft.      Comments: Dry , non inflamed patches of skin noted on abdomen   Musculoskeletal:         General: Normal range of motion.      Cervical back: Normal range of motion and neck supple.   Skin:     General: Skin is warm.      Capillary Refill: Capillary refill takes less than 2 seconds.   Neurological:      General: No focal deficit present.      Mental Status: She is alert and oriented to person, place, and time. Mental status is at baseline.   Psychiatric:         Mood and Affect: Mood normal.         Behavior: Behavior normal.         Thought Content: Thought content normal.         Judgment: Judgment normal.            Medication adherence and barriers to the treatment plan have been addressed. Opportunities to optimize healthy behaviors have been discussed. Patient / caregiver voiced understanding.   I personally spent 35 minutes face-to-face and non-face-to-face in the care of this patient, which includes all pre, intra, and post visit time on the date of service.  Cleon Dew, DNP, FNP-C  [facility]  [phone] (F)    Note - This record has been created using [employer]. Chart creation errors have been sought, but may not always have been located. Such creation errors do not reflect on the standard of medical care.

## 2022-09-03 ENCOUNTER — Other Ambulatory Visit: Payer: Self-pay | Admitting: Psychiatry

## 2022-09-05 ENCOUNTER — Ambulatory Visit: Admit: 2022-09-05 | Discharge: 2022-09-05 | Payer: PRIVATE HEALTH INSURANCE

## 2022-09-05 LAB — HM MAMMOGRAPHY

## 2022-09-08 ENCOUNTER — Ambulatory Visit: Payer: Medicaid Other | Admitting: Physical Therapy

## 2022-09-08 ENCOUNTER — Ambulatory Visit: Admit: 2022-09-08 | Discharge: 2022-09-09 | Payer: PRIVATE HEALTH INSURANCE

## 2022-09-08 DIAGNOSIS — D329 Benign neoplasm of meninges, unspecified: Principal | ICD-10-CM

## 2022-09-08 NOTE — Unmapped (Signed)
She is 56 year old female seen at the request of Dr.Mulholland for an intracranial mass lesion.    Patient has been experiencing bloody noses and headaches.  This prompted imaging which revealed an incidental skull base lesion.  She is scheduled to see neurology regarding her headaches and ENT regarding her bloody noses.  She denies any other symptoms.  She denies any anosmia, vision loss, double vision.    Past medical history significant for pelvic floor dysfunction, hypertension, coronary artery disease, esophageal varices, hepatic encephalopathy, type 2 diabetes diabetic retinopathy hyperlipidemia    Neurologically she is awake and alert her speech is fluent her mental status is intact.  Pupils are equal face was symmetric.  She exhibits full motor function without focality.    I personally reviewed the MRI.  It reveals a calcified lesion along the tuberculum consistent with a small meningioma.  There is no mass effect, edema, or FLAIR abnormality.    I reviewed these findings with the patient and her family, I discussed the biology natural history implications of this lesion.  Her lesion is quite small at this juncture and there is no indication for any type of intervention.  I have recommended surveillance and I have arranged for follow-up MRI in 1 year with return visit.  All of her questions concerns were addressed.  She verbalized understanding and satisfaction.

## 2022-09-08 NOTE — Unmapped (Signed)
Return in 1 year with new MRI.

## 2022-09-09 NOTE — Unmapped (Signed)
Patient called back and stated she was left a message that she could switch to one of the female providers at the practice accepting new patients. I did not see this message in her chart. Please contact patient to assist with scheduling.

## 2022-09-09 NOTE — Unmapped (Signed)
Mammogram (-), negative

## 2022-09-13 ENCOUNTER — Emergency Department
Admit: 2022-09-13 | Discharge: 2022-09-13 | Disposition: A | Discharge status: Home / Self Care | Payer: PRIVATE HEALTH INSURANCE | Attending: Emergency Medicine

## 2022-09-13 ENCOUNTER — Ambulatory Visit
Admit: 2022-09-13 | Discharge: 2022-09-13 | Disposition: A | Discharge status: Home / Self Care | Payer: PRIVATE HEALTH INSURANCE | Attending: Emergency Medicine

## 2022-09-13 DIAGNOSIS — R1011 Right upper quadrant pain: Principal | ICD-10-CM

## 2022-09-13 LAB — COMPREHENSIVE METABOLIC PANEL
ALBUMIN: 2.9 g/dL — ABNORMAL LOW (ref 3.4–5.0)
ALKALINE PHOSPHATASE: 201 U/L — ABNORMAL HIGH (ref 46–116)
ALT (SGPT): 28 U/L (ref 10–49)
ANION GAP: 10 mmol/L (ref 5–14)
AST (SGOT): 58 U/L — ABNORMAL HIGH (ref <=34)
BILIRUBIN TOTAL: 1.3 mg/dL — ABNORMAL HIGH (ref 0.3–1.2)
BLOOD UREA NITROGEN: 10 mg/dL (ref 9–23)
BUN / CREAT RATIO: 15
CALCIUM: 9.2 mg/dL (ref 8.7–10.4)
CHLORIDE: 110 mmol/L — ABNORMAL HIGH (ref 98–107)
CO2: 22.9 mmol/L (ref 20.0–31.0)
CREATININE: 0.65 mg/dL
EGFR CKD-EPI (2021) FEMALE: >90 mL/min/{1.73_m2} (ref >=60)
GLUCOSE RANDOM: 90 mg/dL (ref 70–179)
POTASSIUM: 4 mmol/L (ref 3.4–4.8)
PROTEIN TOTAL: 7.4 g/dL (ref 5.7–8.2)
SODIUM: 143 mmol/L (ref 135–145)

## 2022-09-13 LAB — CBC W/ AUTO DIFF
BASOPHILS ABSOLUTE COUNT: 0 10*9/L (ref 0.0–0.1)
BASOPHILS RELATIVE PERCENT: 0.3 %
EOSINOPHILS ABSOLUTE COUNT: 0.2 10*9/L (ref 0.0–0.5)
EOSINOPHILS RELATIVE PERCENT: 3.8 %
HEMATOCRIT: 34.9 % (ref 34.0–44.0)
HEMOGLOBIN: 11 g/dL — ABNORMAL LOW (ref 11.3–14.9)
LYMPHOCYTES ABSOLUTE COUNT: 1.1 10*9/L (ref 1.1–3.6)
LYMPHOCYTES RELATIVE PERCENT: 25.9 %
MEAN CORPUSCULAR HEMOGLOBIN CONC: 31.6 g/dL — ABNORMAL LOW (ref 32.0–36.0)
MEAN CORPUSCULAR HEMOGLOBIN: 24.5 pg — ABNORMAL LOW (ref 25.9–32.4)
MEAN CORPUSCULAR VOLUME: 77.6 fL (ref 77.6–95.7)
MEAN PLATELET VOLUME: 8 fL (ref 6.8–10.7)
MONOCYTES ABSOLUTE COUNT: 0.4 10*9/L (ref 0.3–0.8)
MONOCYTES RELATIVE PERCENT: 8.4 %
NEUTROPHILS ABSOLUTE COUNT: 2.6 10*9/L (ref 1.8–7.8)
NEUTROPHILS RELATIVE PERCENT: 61.6 %
NUCLEATED RED BLOOD CELLS: 0 /100{WBCs} (ref <=4)
PLATELET COUNT: 182 10*9/L (ref 150–450)
RED BLOOD CELL COUNT: 4.49 10*12/L (ref 3.95–5.13)
RED CELL DISTRIBUTION WIDTH: 16.5 % — ABNORMAL HIGH (ref 12.2–15.2)
WBC ADJUSTED: 4.3 10*9/L (ref 3.6–11.2)

## 2022-09-13 LAB — PROTIME-INR
INR: 1.25
PROTIME: 13.8 s — ABNORMAL HIGH (ref 9.9–12.6)

## 2022-09-13 LAB — APTT
APTT: 33.6 s (ref 24.8–38.4)
HEPARIN CORRELATION: 0.2

## 2022-09-13 LAB — HCG QUANTITATIVE, BLOOD: GONADOTROPIN, CHORIONIC (HCG) QUANT: <2.6 m[IU]/mL

## 2022-09-13 LAB — LIPASE: LIPASE: 92 U/L — ABNORMAL HIGH (ref 12–53)

## 2022-09-13 MED ADMIN — iohexoL (OMNIPAQUE) 350 mg iodine/mL solution 100 mL: 100 mL | INTRAVENOUS | @ 21:00:00 | Stop: 2022-09-13

## 2022-09-13 MED ADMIN — ondansetron (ZOFRAN) injection 4 mg: 4 mg | INTRAVENOUS | @ 20:00:00 | Stop: 2022-09-13

## 2022-09-13 NOTE — Unmapped (Signed)
[facility]  Emergency Department Provider Note      ED Clinical Impression      Final diagnoses:   Right upper quadrant abdominal pain (Primary)            Impression, Medical Decision Making, Progress Notes and Critical Care      Impression, Differential Diagnosis and Plan of Care    56 year old female with history of HTN, HLD, DM2, cirrhosis (thought to be 2/2 hepatic steatosis; no h/o EtOH abuse) complicated by esophageal varices, gastroparesis, depression, anxiety, meningioma who presents s/p mechanical fall 4 days ago onto her right side without head injury or LOC.  Since that time has complained of right-sided abdominal pain and nausea. Denies fevers, chest pain, SOB, back pain, neck pain, vomiting, hematemesis, melena or hematochezia.     Exam: BP 97/81, heart rate 70, afebrile, SPO2 98% on room air.  Normal cardiopulmonary exam.  Abdomen is soft but distended with ascites present.  She has mild tenderness palpation of the right upper quadrant without rebound or guarding.  No RLQ tenderness. No abdominal bruising. No lower extremity edema.  No midline C/T/L-spine tenderness.  No flank tenderness.  Abrasion to the right elbow.  Skin consistent with scattered areas of psoriasis over the torso.    Differential diagnosis includes abdominal wall muscle strain versus abdominal wall contusion versus intra-abdominal bleeding versus renal colic amongst others.  Low suspicion for biliary colic considering prior history of cholecystectomy.  Lower suspicion for hepatic encephalopathy considering patient has been adherent to lactulose therapy and is awake, alert, oriented x3 does and not appear to be encephalopathic whatsoever.  She does not appear toxic.  Low suspicion for spontaneous bacterial peritonitis considering RUQ abdominal pain occurring in setting of mechanical fall and no fevers or vomiting.  Bedside point-of-care abdominal US does show ascites present, however, no tappable fluid collection at this time due to bowel loops present up against the abdominal wall. Will defer paracentesis considering risk of bowel perforation.  She states has never had a paracentesis before. We will check basic labs and CT abdomen/pelvis with IV contrast to rule traumatic injury, if negative anticipate she can be discharged home with continued follow-up with PCP. See course below.     BP 97/81  - Pulse 70  - Temp 36.7 ??C (98.1 ??F)  - Resp 16  - LMP 08/03/2006  - SpO2 98%       Discussion of Management with other Physicians, QHP, or Appropriate Source:   None      Independent Interpretation of Studies:   EKG(s) - see below      External Records Reviewed:   -Reviewed 08/27/2022 [facility] emergency department visit.    -Reviewed 09/08/2022 [place] neurosurgical office visit where patient was evaluated for small meningioma.  Neurosurgery recommended surveillance with outpatient MRI in 1 year.    Escalation of Care, Consideration of Admission/Observation/Transfer:   Admission not required. Appropriate for outpatient management.      Social determinants that significantly affected care:   None      Additional Progress Notes    ED Course as of 09/15/22 1132   Sat Sep 13, 2022   1551 EKG interpreted me: Normal sinus rhythm, rate 68, normal axis, QTc 504 ms, no ST elevation or depression.   1859 HGB(!): 11.0   1859 Lipase(!): 92   1900 hCG Quant: <2.6   1901 CT A/P:  IMPRESSION:  1. Hepatic cirrhosis and sequelae of portal hypertension as described above. Moderate volume  ascites and diffuse body wall edema.  2. Innumerable small hypodense parenchymal nodules in the spleen, corresponding to splenic parenchymal lesions described on the prior abdominal MRI. These are felt to have a benign etiology.     1903 Hemoglobin 11 is improved from 11 days ago.  Negative pregnancy test.  Lipase is mildly elevated at 92, no evidence of pancreatitis on CT scan.   1903 LFTs and T. bili appear stable from 2 months ago. Patient reassessed: Of note, patient did not provide [place] urine sample today but denies having any dysuria, hematuria or urinary frequency. Low suspicion for UTI.  On reassessment with repeat abdominal exam she has no abdominal tenderness at this time and is feeling better. Suspect likely abdominal wall muscle strain. She is tolerating p.o. without difficulty.  Discussed results today's work-up as well as plan for discharge home with PCP follow-up in 48 hours.  Strict return precautions were provided and patient is agreeable.         Portions of this record have been created using Scientist, clinical (histocompatibility and immunogenetics). Dictation errors have been sought, but may not have been identified and corrected.      The case was discussed with the attending physician who is in agreement with the above assessment and plan.     ____________________________________________         History        Reason for Visit  Fall      HPI   Ashley Briggs is a 56 y.o. female with a past medical history of HTN, HLD, DM2, cirrhosis complicated by esophageal varices, gastroparesis, depression, anxiety, meningioma who presents for evaluation of a mechanical fall 4 days ago onto her right side without head injury or LOC.  She reports she was walking her dog in a gravel driveway when the dog pulled on her leash, causing her to trip and fall onto her right side.  She has had constant mild right upper quadrant abdominal pain since that time.  She endorses constant dull non-radiating RUQ abdominal pain and nausea. She is not anticoagulated. She states that she takes lactulose daily pertaining to her cirrhosis with no missed doses. Abdominal surgical history is notable for hysterectomy. Denies fevers, chills, chest pain, shortness of breath, hematemesis, hematochezia, melena, dysuria or urinary frequency.      Outside Historian(s)  (EMS, Significant Other, Family, Parent, Caregiver, Friend, Patent examiner, etc.)    None      Past Medical History:   Diagnosis Date    Anxiety     Blepharitis Cirrhosis (CMS-HCC)     Diabetes mellitus (CMS-HCC)     Endometrial cancer (CMS-HCC) 2007    Gastroparesis     Heart disease     High cholesterol     Hypertension     Liver disease     Major depression     Non-alcoholic fatty liver disease     NPDR (nonproliferative diabetic retinopathy) (CMS-HCC)     Right eye    Psoriasis     Ptosis of eyelid, left     Left upper eyelid    Reflux        Patient Active Problem List   Diagnosis    PFD (pelvic floor dysfunction)    Type 2 diabetes mellitus, with long-term current use of insulin (CMS-HCC)    Hemorrhage of rectum and anus    Hyperlipidemia    Hypertension    Morbid obesity (CMS-HCC)    Dry eye syndrome, bilateral    Angular  blepharoconjunctivitis of both eyes    Cataract associated with type 2 diabetes mellitus (CMS-HCC)    H/O diabetic gastroparesis    Retinopathy of left eye, background, proliferative    Coronary artery disease involving native heart without angina pectoris    Hepatic steatosis    Diabetes mellitus (CMS-HCC)    Diabetic retinopathy (CMS-HCC)    Ptosis of eyelid, left    Esophageal varices in cirrhosis (CMS-HCC)    Hepatic cirrhosis (CMS-HCC)    Secondary esophageal varices without bleeding (CMS-HCC)    Hepatic encephalopathy (CMS-HCC)    Hx of psoriasis       Past Surgical History:   Procedure Laterality Date    CHOLECYSTECTOMY      HYSTERECTOMY      endometrial cancer    HYSTERECTOMY      OOPHORECTOMY      PR ANAL PRESSURE RECORD Left 03/31/2013    Procedure: ANORECTAL MANOMETRY;  Surgeon: None None;  [facility];  Service: Gastroenterology    PR BREATH HYDROGEN TEST N/A 03/31/2013    Procedure: BREATH HYDROGEN TEST;  Surgeon: None None;  Location: [facility];  Service: Gastroenterology    PR COLSC FLX W/RMVL OF TUMOR POLYP LESION SNARE TQ N/A 09/06/2020    Procedure: COLONOSCOPY FLEX; W/REMOV TUMOR/LES BY SNARE;  Surgeon: Chriss Driver, MD;  [facility]: [facility];  Service: Gastroenterology    PR UPPER GI ENDOSCOPY,BIOPSY N/A 04/15/2013    Procedure: UGI ENDOSCOPY; WITH BIOPSY, SINGLE OR MULTIPLE;  Surgeon: Vickii Chafe, MD;  Location: [facility];  Service: Gastroenterology    PR UPPER GI ENDOSCOPY,BIOPSY N/A 09/06/2020    Procedure: UGI ENDOSCOPY; WITH BIOPSY, SINGLE OR MULTIPLE;  Surgeon: Chriss Driver, MD;  [facility]: [facility];  Service: Gastroenterology    PR UPPER GI ENDOSCOPY,BIOPSY N/A 05/05/2022    Procedure: UGI ENDOSCOPY; WITH BIOPSY, SINGLE OR MULTIPLE;  Surgeon: Chriss Driver, MD;  Location: [facility];  Service: Gastroenterology       No current facility-administered medications for this encounter.    Current Outpatient Medications:     amitriptyline (ELAVIL) 10 MG tablet, , Disp: , Rfl:     aspirin (ECOTRIN) 81 MG tablet, Take 1 tablet (81 mg total) by mouth daily., Disp: 90 tablet, Rfl: 3    blood-glucose meter kit, Use as directed by provider., Disp: 1 each, Rfl: 0    buPROPion (WELLBUTRIN XL) 150 MG 24 hr tablet, Take 1 tablet (150 mg total) by mouth daily., Disp: , Rfl:     canagliflozin (INVOKANA) 300 mg Tab tablet, Take 1 tablet (300 mg total) by mouth every morning before breakfast., Disp: 30 tablet, Rfl: 0    carvediloL (COREG) 6.25 MG tablet, Take 1 tablet (6.25 mg total) by mouth Two (2) times a day. Start with 1 tablet nightly, if tolerating well increase to twice daily, Disp: 180 tablet, Rfl: 3    celecoxib (CELEBREX) 200 MG capsule, Take 1 capsule (200 mg total) by mouth two (2) times a day as needed for pain., Disp: 180 capsule, Rfl: 1    citalopram (CELEXA) 40 MG tablet, Take 1 tablet (40 mg total) by mouth daily., Disp: , Rfl:     clonazePAM (KLONOPIN) 0.5 MG tablet, Take 1 tablet (0.5 mg total) by mouth nightly as needed for anxiety and sleep. May repeat dose once., Disp: 40 tablet, Rfl: 1    cyclobenzaprine (FLEXERIL) 5 MG tablet, Take 1-2 tablets by  mouth every 8 hours as needed muscle pain or spasm., Disp: 30 tablet, Rfl: 1    dulaglutide (TRULICITY) 1.5 mg/0.5 mL PnIj, Inject the contents of 2 syringes (3 mg total) under the skin every seven (7) days., Disp: 4 mL, Rfl: 6    ferrous sulfate (SLOW FE) 142 mg (45 mg iron) TbER, Take 1 tablet (142 mg) by mouth two (2) times a day., Disp: 60 tablet, Rfl: 3    HUMIRA PEN CITRATE FREE 40 MG/0.4 ML, Inject the contents of 1 pen (40 mg total) under the skin every fourteen (14) days., Disp: 2 each, Rfl: 0    insulin glargine (LANTUS SOLOSTAR U-100 INSULIN) 100 unit/mL (3 mL) injection pen, Inject 0.66 mL (66 Units total) under the skin nightly., Disp: 15 mL, Rfl: 6    lactulose 10 gram/15 mL solution, Take 30 mL (20 g total) by mouth Three (3) times a day., Disp: 240 mL, Rfl: 0    lancets 30 gauge Misc, Use as directed to test once daily., Disp: 100 each, Rfl: 3    metFORMIN (GLUCOPHAGE-XR) 500 MG 24 hr tablet, Take 2 tablets (1,000 mg total) by mouth in the morning and 2 tablets (1,000 mg total) in the evening. Take with meals., Disp: 360 tablet, Rfl: 1    pantoprazole (PROTONIX) 40 MG tablet, Take 1 tablet (40 mg total) by mouth Two (2) times a day., Disp: 180 tablet, Rfl: 1    pen needle, diabetic 32 gauge x 1/4 (6 mm) Ndle, Use as directed to inject Lantus, Disp: 100 each, Rfl: 3    simvastatin (ZOCOR) 20 MG tablet, Take 1 tablet (20 mg total) by mouth every evening., Disp: 90 tablet, Rfl: 1    zolpidem (AMBIEN) 5 MG tablet, Take 1 tablet (5 mg total) by mouth nightly as needed for sleep., Disp: , Rfl:     Allergies  Patient has no known allergies.    Family History   Problem Relation Age of Onset    Diabetes Mother     Cancer Mother     No Known Problems Father     No Known Problems Sister     Diabetes Maternal Grandmother     Hypertension Maternal Grandmother     Cancer Maternal Grandfather     Diabetes Maternal Grandfather     No Known Problems Paternal Grandmother     Skin cancer Paternal Grandfather     No Known Problems Brother     No Known Problems Maternal Aunt     No Known Problems Maternal Uncle     No Known Problems Paternal Aunt     No Known Problems Paternal Uncle     No Known Problems Other     Melanoma Neg Hx     Basal cell carcinoma Neg Hx     Squamous cell carcinoma Neg Hx     Substance Abuse Disorder Neg Hx     Alcohol abuse Neg Hx     Drug abuse Neg Hx     Mental illness Neg Hx     Amblyopia Neg Hx     Blindness Neg Hx     Cataracts Neg Hx     Glaucoma Neg Hx     Macular degeneration Neg Hx     Retinal detachment Neg Hx     Strabismus Neg Hx     Stroke Neg Hx     Thyroid disease Neg Hx     Breast cancer Neg Hx  Social History  Social History     Tobacco Use    Smoking status: Never     Passive exposure: Current    Smokeless tobacco: Never   Vaping Use    Vaping Use: Never used   Substance Use Topics    Alcohol use: Not Currently     Comment: No longer    Drug use: Never          Physical Exam     ED Triage Vitals [09/13/22 1418]   Enc Vitals Group      BP 97/81      Heart Rate 70      SpO2 Pulse       Resp 16      Temp 36.7 ??C (98.1 ??F)      Temp src       SpO2 98 %     Constitutional: Well appearing, no acute distress.   HEENT: [place]/AT. EOMI. Normal conjunctiva. MMM. No stridor.  Cardiovascular: RRR without m/g/r. 2+ radial pulses bilaterally.  No lower extremity edema.  Respiratory: Normal respiratory effort. Lungs CTAB.   Gastrointestinal: Abdomen is soft but distended with ascites present.  She has mild tenderness palpation the right upper quadrant without rebound or guarding. No flank tenderness.   Musculoskeletal: Extremities warm and well perfused. No long bone deformities.  No midline C/T/L-spine tenderness.  Neurologic: Awake, alert, oriented x3, eyes open. No facial droop. No slurred speech. No aphasia. Moves all extremities spontaneously. Follows commands.   Skin: Abrasion to the right elbow.  Skin consistent with scattered areas of psoriasis over the torso.  Psychiatric: Mood and affect are normal. Speech and behavior are normal.        Labs     Results for orders placed or performed during the hospital encounter of 09/13/22   Comprehensive metabolic panel   Result Value Ref Range    Sodium 143 135 - 145 mmol/L    Potassium 4.0 3.4 - 4.8 mmol/L    Chloride 110 (H) 98 - 107 mmol/L    CO2 22.9 20.0 - 31.0 mmol/L    Anion Gap 10 5 - 14 mmol/L    BUN 10 9 - 23 mg/dL    Creatinine 6.04 5.40 - 0.80 mg/dL    BUN/Creatinine Ratio 15     eGFR CKD-EPI (2021) Female >90 >=60 mL/min/1.50m2    Glucose 90 70 - 179 mg/dL    Calcium 9.2 8.7 - 98.1 mg/dL    Albumin 2.9 (L) 3.4 - 5.0 g/dL    Total Protein 7.4 5.7 - 8.2 g/dL    Total Bilirubin 1.3 (H) 0.3 - 1.2 mg/dL    AST 58 (H) <=19 U/L    ALT 28 10 - 49 U/L    Alkaline Phosphatase 201 (H) 46 - 116 U/L   Lipase   Result Value Ref Range    Lipase 92 (H) 12 - 53 U/L   HCG Quantitative, Blood   Result Value Ref Range    hCG Quantitative <2.6 mIU/mL   APTT   Result Value Ref Range    APTT 33.6 24.8 - 38.4 sec    Heparin Correlation 0.2    Protime-INR   Result Value Ref Range    PT 13.8 (H) 9.9 - 12.6 sec    INR 1.25    ECG 12 Lead   Result Value Ref Range    EKG Systolic BP  mmHg    EKG Diastolic BP  mmHg    EKG Ventricular Rate  68 BPM    EKG Atrial Rate 68 BPM    EKG P-R Interval 176 ms    EKG QRS Duration 98 ms    EKG Q-T Interval 474 ms    EKG QTC Calculation 504 ms    EKG Calculated P Axis 56 degrees    EKG Calculated R Axis 36 degrees    EKG Calculated T Axis 22 degrees    QTC Fredericia 494 ms   POCT Glucose   Result Value Ref Range    Glucose, POC 85 70 - 179 mg/dL   CBC w/ Differential   Result Value Ref Range    WBC 4.3 3.6 - 11.2 10*9/L    RBC 4.49 3.95 - 5.13 10*12/L    HGB 11.0 (L) 11.3 - 14.9 g/dL    HCT 32.9 51.8 - 84.1 %    MCV 77.6 77.6 - 95.7 fL    MCH 24.5 (L) 25.9 - 32.4 pg    MCHC 31.6 (L) 32.0 - 36.0 g/dL    RDW 66.0 (H) 63.0 - 15.2 %    MPV 8.0 6.8 - 10.7 fL    Platelet 182 150 - 450 10*9/L    nRBC 0 <=4 /100 WBCs    Neutrophils % 61.6 %    Lymphocytes % 25.9 %    Monocytes % 8.4 % Eosinophils % 3.8 %    Basophils % 0.3 %    Absolute Neutrophils 2.6 1.8 - 7.8 10*9/L    Absolute Lymphocytes 1.1 1.1 - 3.6 10*9/L    Absolute Monocytes 0.4 0.3 - 0.8 10*9/L    Absolute Eosinophils 0.2 0.0 - 0.5 10*9/L    Absolute Basophils 0.0 0.0 - 0.1 10*9/L        Radiology     CT Abdomen Pelvis W IV Contrast Only   Final Result   1. Hepatic cirrhosis and sequelae of portal hypertension as described above. Moderate volume ascites and diffuse body wall edema.   2. Innumerable small hypodense parenchymal nodules in the spleen, corresponding to splenic parenchymal lesions described on the prior abdominal MRI. These are felt to have a benign etiology.             Documentation assistance was provided by Enzo Bi, Scribe on September 13, 2022 at 3:07 PM for Ledon Snare, MD.       Documentation assistance was provided by the scribe in my presence.  The documentation recorded by the scribe has been reviewed by me and accurately reflects the services I personally performed.           Ewell Benassi, Dustin Folks, MD  Resident  09/15/22 [phone]

## 2022-09-13 NOTE — Unmapped (Signed)
Pt reports falling on right side on Thursday night. Denies hitting hitting head or LOC. C/o pain to R sided abdomen and nausea.

## 2022-09-16 DIAGNOSIS — Z794 Long term (current) use of insulin: Principal | ICD-10-CM

## 2022-09-16 DIAGNOSIS — E113212 Type 2 diabetes mellitus with mild nonproliferative diabetic retinopathy with macular edema, left eye: Principal | ICD-10-CM

## 2022-09-16 MED ORDER — CANAGLIFLOZIN 300 MG TABLET
ORAL_TABLET | Freq: Every day | ORAL | 0 refills | 30 days
Start: 2022-09-16 — End: ?

## 2022-09-17 ENCOUNTER — Ambulatory Visit: Payer: Medicaid Other | Admitting: Physical Therapy

## 2022-09-17 ENCOUNTER — Ambulatory Visit
Admit: 2022-09-17 | Discharge: 2022-09-18 | Payer: PRIVATE HEALTH INSURANCE | Attending: Student in an Organized Health Care Education/Training Program | Primary: Student in an Organized Health Care Education/Training Program

## 2022-09-17 DIAGNOSIS — R519 Bilateral headache: Principal | ICD-10-CM

## 2022-09-17 DIAGNOSIS — H9202 Otalgia, left ear: Principal | ICD-10-CM

## 2022-09-17 DIAGNOSIS — R188 Other ascites: Principal | ICD-10-CM

## 2022-09-17 DIAGNOSIS — E113212 Type 2 diabetes mellitus with mild nonproliferative diabetic retinopathy with macular edema, left eye: Principal | ICD-10-CM

## 2022-09-17 DIAGNOSIS — Z794 Long term (current) use of insulin: Principal | ICD-10-CM

## 2022-09-17 DIAGNOSIS — I10 Essential (primary) hypertension: Principal | ICD-10-CM

## 2022-09-17 DIAGNOSIS — E1165 Type 2 diabetes mellitus with hyperglycemia: Principal | ICD-10-CM

## 2022-09-17 DIAGNOSIS — Z872 Personal history of diseases of the skin and subcutaneous tissue: Principal | ICD-10-CM

## 2022-09-17 DIAGNOSIS — K7460 Unspecified cirrhosis of liver: Principal | ICD-10-CM

## 2022-09-17 MED ORDER — CANAGLIFLOZIN 300 MG TABLET
ORAL_TABLET | Freq: Every day | ORAL | 1 refills | 30 days | Status: CP
Start: 2022-09-17 — End: ?
  Filled 2022-09-23: qty 30, 30d supply, fill #0

## 2022-09-17 MED ORDER — SPIRONOLACTONE 100 MG TABLET
ORAL_TABLET | Freq: Every day | ORAL | 2 refills | 30 days | Status: CP
Start: 2022-09-17 — End: 2022-12-16
  Filled 2022-09-23: qty 30, 30d supply, fill #0

## 2022-09-17 MED ORDER — FUROSEMIDE 40 MG TABLET
ORAL_TABLET | Freq: Every day | ORAL | 2 refills | 30 days | Status: CP
Start: 2022-09-17 — End: 2022-12-16
  Filled 2022-09-23: qty 30, 30d supply, fill #0

## 2022-09-17 NOTE — Unmapped (Signed)
Patient is requesting the following refill  Requested Prescriptions     Pending Prescriptions Disp Refills    canagliflozin (INVOKANA) 300 mg Tab tablet 30 tablet 1     Sig: Take 1 tablet (300 mg total) by mouth every morning before breakfast.       Recent Visits  Date Type Provider Dept   09/02/22 Office Visit Deneise Lever, FNP [place] [facility] [address] [facility]   08/06/22 Office Visit Johnn Hai, Loleta Rose, FNP [place] [facility] [address] [facility]   03/12/22 Office Visit Pou, Inetta Fermo, MD [place] [facility]   03/06/22 Office Visit Farrug, Emogene Morgan, NP [place] [facility] [address] At [facility]   02/11/22 Office Visit Farrug, Emogene Morgan, NP [place] [facility]   01/13/22 Office Visit Nile Dear, Emogene Morgan, NP  [facility] At [facility]   Showing recent visits within past 365 days with a meds authorizing provider and meeting all other requirements  Today's Visits  Date Type Provider Dept   09/17/22 Appointment Mangel, Benison Pap, DO  [facility] [address] [facility]   Showing today's visits with a meds authorizing provider and meeting all other requirements  Future Appointments  Date Type Provider Dept   11/11/22 Appointment Mangel, Benison Pap, DO  [facility] [address] At [facility]   Showing future appointments within next 365 days with a meds authorizing provider and meeting all other requirements       Labs: A1c:   Hemoglobin A1C (%)   Date Value   09/02/2022 9.8 (A)    Creatinine:   Creatinine   Date Value   09/13/2022 0.65 mg/dL   09/81/1914 7.82 MG/DL     Additional refills given d/t provider having to r/s patient's appointment last week. No further concerns at this moment.

## 2022-09-17 NOTE — Unmapped (Signed)
Patient sent MyChart message stating she had a fall from standing on 10/14, landing on gravel driveway. She has been having pain in her upper abdomen, worsening distention, and shortness of breath. CT in ED 10/14 showed moderate ascites and diffuse body wall edema. She has had small to moderate ascites on MRI in 05/2022. She likely has volume overload, given ascites and body wall edema. Differential diagnosis includes hemoperitoneum, however, her hemoglobin 10/14 in ED was stable as compared to baseline which is reassuring. We will start diuretics (Lasix 40 mg daily and spironolactone 100 mg daily) with repeat BMP next week. Plan for diagnostic and therapeutic paracentesis. Patient advised to go to the ED if worsening dyspnea.

## 2022-09-17 NOTE — Unmapped (Signed)
Please go to the emergency room if you experience: Fever, chills, feeling of passing out, nausea, vomiting, diarrhea, chest pain, shortness of breath, weakness, falls.

## 2022-09-17 NOTE — Unmapped (Addendum)
Ashley Briggs  12/26/1965    Assessment & Plan:  Bilateral headache  -     Cancel: Ambulatory referral to Neurology; Future  -     Ambulatory referral to Neurology; Future    Left ear pain  -     Ambulatory referral to ENT; Future    Cirrhosis of liver with ascites, unspecified hepatic cirrhosis type (CMS-HCC)    Uncontrolled type 2 diabetes mellitus with hyperglycemia (CMS-HCC)    Hx of psoriasis    Hypertension, unspecified type  Overview:  Last Assessment & Plan:   The current medical regimen is effective;  continue present plan and medications.      Epistaxis  -     Ambulatory referral to ENT; Future    -------------------------------------------------------------------------------------------------------------    56 yo female presents for multiple concerns today.     She is requesting ENT referral for left ear pain and epistaxis   She is also requesting Neurology referral. She does follow with Neurosurgery, last evaluated on 09/08/2022. Plan for MRI in 1 year (2024) and follow up visit but no indication for any intervention at this time. We discussed different abortive therapies for her headaches but she has not tried any of these options. However, she would like to see neurology. Referral placed per patient request.       #Uncontrolled T2DM   -Diabetes is very uncontrolled at this time, A1C of 9.8% on 09/02/2022. She is monitoring her AM blood sugars but not evening.   -Current regimen includes Lantus 66U at bedtime, Invokana, Metformin, Trulicity.   -Will discuss diabetic eye exam   -Discussed that her intertrigo infectious are likely due to uncontrolled diabetes.   -Following with dermatology for her Psoariasis       #Cirrhosis with ascites, esophageal varices   -CT abdomen/pelvis w/ IV Contrast obtained 09/13/22 revealed the following findings:   The liver has a nodular contour corresponding to known cirrhosis. No focal hepatic parenchymal lesions identified. Portal vein is patent. The spleen is enlarged, measuring up to 18.4 cm in maximal craniocaudal dimension. There are innumerable small hypodense parenchymal nodules in the spleen, corresponding to findings on the comparison abdominal MRI.      Small caliber portosystemic collaterals are seen in the upper abdomen. Moderate diffuse ascites. Diffuse body wall edema.   -Reviewed her labs revealing Elevated AST, Alk phos, T. Bilirubin and low Albumin status   -[facility] and advised to schedule appointment as she may need paracentesis.   -She should be on Coreg 6.25mg  at bedtime and is on spironolactone and lasix.   -ED precautions heavily discussed with patient who verbalized understanding      Preventative health  -Mammogram Bi-RADS 1 09/05/2022, due 09/2023  -CRC screening: 08/2020 which revealed 3mm polyp. To repeat in 7 years (2028)  -Immunizations: will discuss shingrex next visit.       Return in about 4 weeks (around 10/15/2022). Will offer pharmacy referral next visit to discuss medications and insuring no interactions/contraindications to medications.       Subjective:  Subjective    HPI: [name] is a 56 y.o. female here for multiple concerns.     She fell Thursday (10/12) and went to the ED on (10/14). She has a lot of pain on right side.   She notes there was plan for paracentesis but was told it was too dangerous   She has lost her control of her bowels for the past few months but  not specific.   She denies saddle anesthesia.       Requesting referrals to:  -Neurology referral:  for headaches  -ENT: a lot of nose bleeds, left ear pain. No decrease hearing     Sees BH/Therapist--next month. Her cousin/friend notes that she is doing better than what the patient herself believes.     For her DM, she reports low AM BG of 60-70s. Her current regimen includes: Lantus 66U and is taking invokana, Trulicity and Metformin         ROS:  Review of systems negative unless otherwise noted as per HPI.    Objective:  Objective Vitals:    09/17/22 1102   BP: 120/70   Pulse: 97   Temp: 37.1 ??C (98.7 ??F)   SpO2: 96%     Body mass index is 46.19 kg/m??.      Wt Readings from Last 3 Encounters:   09/17/22 (!) 122.1 kg (269 lb 1.6 oz)   09/08/22 (!) 115.7 kg (255 lb 1.6 oz)   09/02/22 (!) 115.3 kg (254 lb 1.6 oz)        Physical Exam:  Physical Exam  Constitutional:       Appearance: Normal appearance. She is obese.   HENT:      Head: Normocephalic and atraumatic.      Nose: No congestion.   Eyes:      General: No scleral icterus.     Extraocular Movements: Extraocular movements intact.   Cardiovascular:      Rate and Rhythm: Normal rate and regular rhythm.   Pulmonary:      Effort: Pulmonary effort is normal.      Breath sounds: Normal breath sounds.   Abdominal:      General: There is distension.      Palpations: Abdomen is soft.      Tenderness: There is no abdominal tenderness. There is no guarding or rebound.      Comments: Mild ascites is present   Musculoskeletal:      Right lower leg: Edema present.      Left lower leg: Edema present.   Skin:     Coloration: Skin is not jaundiced.      Findings: No erythema or rash.      Comments: Eczema on hands, Psoriatic lesions on abdomen    Neurological:      General: No focal deficit present.      Mental Status: She is alert.      Cranial Nerves: No cranial nerve deficit.      Gait: Gait abnormal (at baseline however).   Psychiatric:         Mood and Affect: Mood normal.

## 2022-09-18 DIAGNOSIS — R188 Other ascites: Principal | ICD-10-CM

## 2022-09-18 DIAGNOSIS — K7460 Unspecified cirrhosis of liver: Principal | ICD-10-CM

## 2022-09-19 DIAGNOSIS — E113212 Type 2 diabetes mellitus with mild nonproliferative diabetic retinopathy with macular edema, left eye: Principal | ICD-10-CM

## 2022-09-19 DIAGNOSIS — Z794 Long term (current) use of insulin: Principal | ICD-10-CM

## 2022-09-19 MED ORDER — TRULICITY 1.5 MG/0.5 ML SUBCUTANEOUS PEN INJECTOR
SUBCUTANEOUS | 6 refills | 28 days
Start: 2022-09-19 — End: ?

## 2022-09-20 MED ORDER — TRULICITY 1.5 MG/0.5 ML SUBCUTANEOUS PEN INJECTOR
SUBCUTANEOUS | 6 refills | 28 days | Status: CP
Start: 2022-09-20 — End: ?
  Filled 2022-09-23: qty 4, 28d supply, fill #0

## 2022-09-20 NOTE — Unmapped (Signed)
Attempted to contact patient. No answer. Patient's voicemail full, unable to leave message. Will try contacting again at a later time.

## 2022-09-21 LAB — COMPREHENSIVE METABOLIC PANEL
ALBUMIN: 2.6 g/dL — ABNORMAL LOW (ref 3.4–5.0)
ALKALINE PHOSPHATASE: 194 U/L — ABNORMAL HIGH (ref 46–116)
ALT (SGPT): 23 U/L (ref 10–49)
ANION GAP: 7 mmol/L (ref 5–14)
AST (SGOT): 60 U/L — ABNORMAL HIGH (ref <=34)
BILIRUBIN TOTAL: 1.2 mg/dL (ref 0.3–1.2)
BLOOD UREA NITROGEN: 7 mg/dL — ABNORMAL LOW (ref 9–23)
BUN / CREAT RATIO: 12
CALCIUM: 8.9 mg/dL (ref 8.7–10.4)
CHLORIDE: 111 mmol/L — ABNORMAL HIGH (ref 98–107)
CO2: 24.8 mmol/L (ref 20.0–31.0)
CREATININE: 0.59 mg/dL — ABNORMAL LOW
EGFR CKD-EPI (2021) FEMALE: >90 mL/min/{1.73_m2} (ref >=60)
GLUCOSE RANDOM: 131 mg/dL (ref 70–179)
POTASSIUM: 3.8 mmol/L (ref 3.4–4.8)
PROTEIN TOTAL: 7.1 g/dL (ref 5.7–8.2)
SODIUM: 143 mmol/L (ref 135–145)

## 2022-09-21 LAB — CBC W/ AUTO DIFF
BASOPHILS ABSOLUTE COUNT: 0 10*9/L (ref 0.0–0.1)
BASOPHILS RELATIVE PERCENT: 0.4 %
EOSINOPHILS ABSOLUTE COUNT: 0.2 10*9/L (ref 0.0–0.5)
EOSINOPHILS RELATIVE PERCENT: 5.7 %
HEMATOCRIT: 32.7 % — ABNORMAL LOW (ref 34.0–44.0)
HEMOGLOBIN: 10.3 g/dL — ABNORMAL LOW (ref 11.3–14.9)
LYMPHOCYTES ABSOLUTE COUNT: 1.1 10*9/L (ref 1.1–3.6)
LYMPHOCYTES RELATIVE PERCENT: 26.6 %
MEAN CORPUSCULAR HEMOGLOBIN CONC: 31.6 g/dL — ABNORMAL LOW (ref 32.0–36.0)
MEAN CORPUSCULAR HEMOGLOBIN: 24.2 pg — ABNORMAL LOW (ref 25.9–32.4)
MEAN CORPUSCULAR VOLUME: 76.6 fL — ABNORMAL LOW (ref 77.6–95.7)
MEAN PLATELET VOLUME: 7.7 fL (ref 6.8–10.7)
MONOCYTES ABSOLUTE COUNT: 0.4 10*9/L (ref 0.3–0.8)
MONOCYTES RELATIVE PERCENT: 9.7 %
NEUTROPHILS ABSOLUTE COUNT: 2.4 10*9/L (ref 1.8–7.8)
NEUTROPHILS RELATIVE PERCENT: 57.6 %
NUCLEATED RED BLOOD CELLS: 0 /100{WBCs} (ref <=4)
PLATELET COUNT: 149 10*9/L — ABNORMAL LOW (ref 150–450)
RED BLOOD CELL COUNT: 4.26 10*12/L (ref 3.95–5.13)
RED CELL DISTRIBUTION WIDTH: 17.4 % — ABNORMAL HIGH (ref 12.2–15.2)
WBC ADJUSTED: 4.2 10*9/L (ref 3.6–11.2)

## 2022-09-21 LAB — LIPASE: LIPASE: 57 U/L — ABNORMAL HIGH (ref 12–53)

## 2022-09-22 ENCOUNTER — Ambulatory Visit: Payer: Medicaid Other | Admitting: Physical Therapy

## 2022-09-22 ENCOUNTER — Ambulatory Visit: Admit: 2022-09-22 | Payer: PRIVATE HEALTH INSURANCE

## 2022-09-22 ENCOUNTER — Ambulatory Visit: Admit: 2022-09-22 | Discharge: 2022-09-30 | Payer: PRIVATE HEALTH INSURANCE

## 2022-09-22 ENCOUNTER — Ambulatory Visit
Admit: 2022-09-22 | Discharge: 2022-09-30 | Disposition: A | Discharge status: Home / Self Care | Payer: PRIVATE HEALTH INSURANCE | Admitting: Internal Medicine

## 2022-09-22 DIAGNOSIS — R1011 Right upper quadrant pain: Secondary | ICD-10-CM | POA: Insufficient documentation

## 2022-09-22 DIAGNOSIS — R188 Other ascites: Secondary | ICD-10-CM | POA: Insufficient documentation

## 2022-09-22 DIAGNOSIS — R110 Nausea: Secondary | ICD-10-CM | POA: Insufficient documentation

## 2022-09-22 LAB — GLUCOSE, BODY FLUID: GLUCOSE FLUID: 132 mg/dL

## 2022-09-22 LAB — URINALYSIS WITH MICROSCOPY WITH CULTURE REFLEX
BACTERIA: NONE SEEN /HPF
BILIRUBIN UA: NEGATIVE
BLOOD UA: NEGATIVE
GLUCOSE UA: >1000 — AB
KETONES UA: NEGATIVE
LEUKOCYTE ESTERASE UA: NEGATIVE
NITRITE UA: NEGATIVE
PH UA: 6 (ref 5.0–9.0)
PROTEIN UA: NEGATIVE
RBC UA: 7 /HPF — ABNORMAL HIGH (ref <=4)
SPECIFIC GRAVITY UA: 1.04 — ABNORMAL HIGH (ref 1.003–1.030)
SQUAMOUS EPITHELIAL: 46 /HPF — ABNORMAL HIGH (ref 0–5)
TRANSITIONAL EPITHELIAL: 1 /HPF (ref 0–2)
UROBILINOGEN UA: 2 — AB
WBC UA: 5 /HPF (ref 0–5)

## 2022-09-22 LAB — LACTATE DEHYDROGENASE: LACTATE DEHYDROGENASE: 166 U/L (ref 120–246)

## 2022-09-22 LAB — B-TYPE NATRIURETIC PEPTIDE: B-TYPE NATRIURETIC PEPTIDE: 109.96 pg/mL — ABNORMAL HIGH (ref <=100)

## 2022-09-22 LAB — HIGH SENSITIVITY TROPONIN I - SINGLE: HIGH SENSITIVITY TROPONIN I: 33 ng/L (ref <=34)

## 2022-09-22 LAB — HIGH SENSITIVITY TROPONIN I - 4 HOUR SERIAL
HIGH SENSITIVITY TROPONIN - DELTA (2-6H): 4 ng/L (ref <=7)
HIGH-SENSITIVITY TROPONIN I - 6 HOUR: 35 ng/L (ref <=34)

## 2022-09-22 LAB — HIGH SENSITIVITY TROPONIN I - 2H/6H SERIAL
HIGH SENSITIVITY TROPONIN - DELTA (0-2H): 6 ng/L (ref <=7)
HIGH-SENSITIVITY TROPONIN I - 2 HOUR: 39 ng/L (ref <=34)

## 2022-09-22 MED ADMIN — citalopram (CeleXA) tablet 40 mg: 40 mg | ORAL | @ 23:00:00

## 2022-09-22 MED ADMIN — buPROPion (WELLBUTRIN XL) 24 hr tablet 150 mg: 150 mg | ORAL | @ 23:00:00

## 2022-09-22 MED ADMIN — oxyCODONE (ROXICODONE) immediate release tablet 5 mg: 5 mg | ORAL | @ 23:00:00 | Stop: 2022-10-06

## 2022-09-22 MED ADMIN — iohexoL (OMNIPAQUE) 350 mg iodine/mL solution 100 mL: 100 mL | INTRAVENOUS | @ 05:00:00 | Stop: 2022-09-22

## 2022-09-22 MED ADMIN — ondansetron (ZOFRAN) injection 4 mg: 4 mg | INTRAVENOUS | @ 04:00:00 | Stop: 2022-09-22

## 2022-09-22 MED ADMIN — ketorolac (TORADOL) injection 15 mg: 15 mg | INTRAVENOUS | @ 04:00:00 | Stop: 2022-09-22

## 2022-09-22 MED ADMIN — furosemide (LASIX) injection 20 mg: 20 mg | INTRAVENOUS | @ 23:00:00 | Stop: 2022-09-22

## 2022-09-22 NOTE — Unmapped (Signed)
Pt reports increased abdominal pain and weight increase of 3 lbs in 24 hours.

## 2022-09-22 NOTE — Unmapped (Addendum)
[facility]  Emergency Department Provider Note     ED Clinical Impression     Final diagnoses:   Ascites due to alcoholic cirrhosis (CMS-HCC) (Primary)   Abdominal distention      Impression, Medical Decision Making, ED Course     Impression: 56 y.o. female who has a past medical history of Anxiety, Blepharitis, Cirrhosis (CMS-HCC), Diabetes mellitus (CMS-HCC), Endometrial cancer (CMS-HCC) (2007), Gastroparesis, Heart disease, High cholesterol, Hypertension, Liver disease, Major depression, Non-alcoholic fatty liver disease, NPDR (nonproliferative diabetic retinopathy) (CMS-HCC), Psoriasis, Ptosis of eyelid, left, and Reflux. who presents with several days of worsening abdominal pain, distention, lower extremity edema, and shortness of breath as described below.     Initial vitals are normal and reassuring.  Heart rate 77, respiratory rate 20, satting 99% on room air, blood pressure 135/53, temperature 98.1 F.  On exam, heart sounds are regular.  Breath sounds clear.  Abdomen soft, moderately distended, right-sided abdominal tenderness without rebound or guarding.  Not peritonitic.  Lower extremities with mild symmetric pitting edema bilaterally.    Differential diagnosis includes ACS, arrhythmia, pulmonary edema, viral illness, COVID, influenza, CHF, ascites, cirrhosis progression, decompensated cirrhosis, AKI, hypoalbuminemia, hepatitis, versus other etiology.  Do not suspect PE given patient is not tachycardic or tachypneic and satting well on room air although considered.  Do not suspect dissection given no tearing or ripping chest pain, no radiation into the back.  Regarding her abdominal pain and distention, less likely SBP given patient is afebrile and not peritonitis, overall well appearing however given significant ascites on bedside US will complete diagnostic paracentesis. She is status post cholecystectomy, less likely to be biliary pathology although choledocholithiasis possible.  Appendicitis and diverticulitis possible given tenderness on exam although less likely.  Do not suspect ovarian pathology such as torsion or cyst rupture based on history and presentation.  Plan for basic labs, lipase, BNP, troponin, chest x-ray, viral swab, and CT abdomen/pelvis with IV contrast.    Diagnostic workup as below.     Orders Placed This Encounter   Procedures    PARACENTESIS    Urine Culture    Ascitic/Peritoneal Fluid Culture    Body fluid cell count    Bilirubin, Body Fluid    Amylase, body fluid    Lactate Dehydrogenase, Body Fluid    Protein, Body Fluid    XR Chest Portable    CT Abdomen Pelvis W IV Contrast Only    CBC w/ Differential    Comprehensive Metabolic Panel    Lipase Level    Urinalysis with Microscopy with Culture Reflex    hsTroponin I (single, no delta)    B-type Natriuretic Peptide    hsTroponin I (serial 2-6H CONTINUATION w/ delta)    hsTroponin I - 6 Hour    Glucose, Body Fluid    LDH, Lactate dehydrogenase    ECG 12 Lead    ED Admit Decision       ED Course as of 09/22/22 0520   Sun Sep 21, 2022   2347 CBC with normal WBC of 4.2, mild anemia with hemoglobin 10.3 which appears similar to prior, mild thrombocytopenia to 149.   2348 CMP unremarkable.  Lipase slightly elevated to 57.   Mon Sep 22, 2022   0008 hsTroponin I: 8  Will repeat.   0039 BNP(!): 109.96   0039 EKG shows normal sinus rhythm with a rate of 72 bpm, QTc 514 ms, no ST elevation or depression, flattening of T waves in lead III.  0049 XR Chest Portable  Per my independent interpretation, negative for acute cardiopulmonary process.   0054 UA shows sniffing and amount of glucose, negative for infection.   0127 CT Abdomen Pelvis W IV Contrast Only  IMPRESSION:  -No acute abnormality in the abdomen or pelvis.  -Redemonstrated hepatic cirrhosis and sequela of portal hypertension with moderate volume ascites and diffuse body wall edema unchanged from prior.  -Unchanged numerous small hypodense parenchymal nodules in the spleen, previously characterized on abdominal MRI as benign.   0130 Patient tolerated diagnostic paracentesis without difficulty.  Plan for admission given significant ascites as patient likely needs LVP.   0205 hsTroponin I(!!): 39   0205 delta hsTroponin I: 6   0315 NEUT: 35  ANC less than 250, less likely to be SBP.   0456 Patient signed out awaiting team assignment and admission.       Independent Interpretation of Studies: I have independently interpreted the following studies:  See MDM and ED course    Discussion of Management With Other Providers or Support Staff: I discussed the management of this patient with the:  Admitting team    Escalation of Care including OBS/Admission/Transfer was considered:  Admission  ____________________________________________    The case was discussed with the attending physician, who is in agreement with the above assessment and plan.      History     Chief Complaint  Chief Complaint   Patient presents with    Abdominal Pain       HPI   Ashley Briggs is a 56 y.o. female with past medical history as below who presents with 1 to 2 weeks of worsening abdominal pain, distention, mild shortness of breath, bilateral lower extremity edema, and nausea.  Patient states that approximately 1 week ago she fell, hitting the right side of her abdomen.  She was evaluated here and CT abdomen/pelvis at that time was negative.  Endorses continued right upper quadrant abdominal pain as well as worsening distention.  States the pain will occasionally radiate into her chest.  Denies fever, cough, congestion, runny nose, vomiting, hematochezia, melena, hematemesis, dysuria, hematuria, or any other complaints.  No history of PE or DVT.  Last alcohol use was several years ago.  No history of complex withdrawal.    Outside Historian(s): N/A    External Records Reviewed: I have reviewed progress note with [facility] on 09/17/2022, patient has a history of cirrhosis of the liver with ascites, type 2 diabetes, psoriasis.    Past Medical History:   Diagnosis Date    Anxiety     Blepharitis     Cirrhosis (CMS-HCC)     Diabetes mellitus (CMS-HCC)     Endometrial cancer (CMS-HCC) 2007    Gastroparesis     Heart disease     High cholesterol     Hypertension     Liver disease     Major depression     Non-alcoholic fatty liver disease     NPDR (nonproliferative diabetic retinopathy) (CMS-HCC)     Right eye    Psoriasis     Ptosis of eyelid, left     Left upper eyelid    Reflux        Past Surgical History:   Procedure Laterality Date    CHOLECYSTECTOMY      HYSTERECTOMY      endometrial cancer    HYSTERECTOMY      OOPHORECTOMY      PR ANAL PRESSURE RECORD Left 03/31/2013  Procedure: ANORECTAL MANOMETRY;  Surgeon: None None;  [facility];  Service: Gastroenterology    PR BREATH HYDROGEN TEST N/A 03/31/2013    Procedure: BREATH HYDROGEN TEST;  Surgeon: None None;  Location: [facility];  Service: Gastroenterology    PR COLSC FLX W/RMVL OF TUMOR POLYP LESION SNARE TQ N/A 09/06/2020    Procedure: COLONOSCOPY FLEX; W/REMOV TUMOR/LES BY SNARE;  Surgeon: Chriss Driver, MD;  Location: [facility];  Service: Gastroenterology    PR UPPER GI ENDOSCOPY,BIOPSY N/A 04/15/2013    Procedure: UGI ENDOSCOPY; WITH BIOPSY, SINGLE OR MULTIPLE;  Surgeon: Vickii Chafe, MD;  [facility]: [facility];  Service: Gastroenterology    PR UPPER GI ENDOSCOPY,BIOPSY N/A 09/06/2020    Procedure: UGI ENDOSCOPY; WITH BIOPSY, SINGLE OR MULTIPLE;  Surgeon: Chriss Driver, MD;  Location: [facility];  Service: Gastroenterology    PR UPPER GI ENDOSCOPY,BIOPSY N/A 05/05/2022    Procedure: UGI ENDOSCOPY; WITH BIOPSY, SINGLE OR MULTIPLE;  Surgeon: Chriss Driver, MD;  [facility];  Service: Gastroenterology       No current facility-administered medications for this encounter.    Current Outpatient Medications:     aspirin (ECOTRIN) 81 MG tablet, Take 1 tablet (81 mg total) by mouth daily., Disp: 90 tablet, Rfl: 3    blood-glucose meter kit, Use as directed by provider., Disp: 1 each, Rfl: 0    buPROPion (WELLBUTRIN XL) 150 MG 24 hr tablet, Take 1 tablet (150 mg total) by mouth daily., Disp: , Rfl:     canagliflozin (INVOKANA) 300 mg Tab tablet, Take 1 tablet (300 mg total) by mouth every morning before breakfast., Disp: 30 tablet, Rfl: 1    carvediloL (COREG) 6.25 MG tablet, Take 1 tablet (6.25 mg total) by mouth Two (2) times a day. Start with 1 tablet nightly, if tolerating well increase to twice daily, Disp: 180 tablet, Rfl: 3    celecoxib (CELEBREX) 200 MG capsule, Take 1 capsule (200 mg total) by mouth two (2) times a day as needed for pain., Disp: 180 capsule, Rfl: 1    citalopram (CELEXA) 40 MG tablet, Take 1 tablet (40 mg total) by mouth daily., Disp: , Rfl:     clonazePAM (KLONOPIN) 0.5 MG tablet, Take 1 tablet (0.5 mg total) by mouth nightly as needed for anxiety and sleep. May repeat dose once., Disp: 40 tablet, Rfl: 1    cyclobenzaprine (FLEXERIL) 5 MG tablet, Take 1-2 tablets by mouth every 8 hours as needed muscle pain or spasm., Disp: 30 tablet, Rfl: 1    dulaglutide (TRULICITY) 1.5 mg/0.5 mL PnIj, Inject the contents of 2 syringes (3 mg total) under the skin every seven (7) days., Disp: 4 mL, Rfl: 6    ferrous sulfate (SLOW FE) 142 mg (45 mg iron) TbER, Take 1 tablet (142 mg) by mouth two (2) times a day., Disp: 60 tablet, Rfl: 3    furosemide (LASIX) 40 MG tablet, Take 1 tablet (40 mg total) by mouth daily., Disp: 30 tablet, Rfl: 2    HUMIRA PEN CITRATE FREE 40 MG/0.4 ML, Inject the contents of 1 pen (40 mg total) under the skin every fourteen (14) days., Disp: 2 each, Rfl: 0    insulin glargine (LANTUS SOLOSTAR U-100 INSULIN) 100 unit/mL (3 mL) injection pen, Inject 0.66 mL (66 Units total) under the skin nightly., Disp: 15 mL, Rfl: 6    lactulose 10 gram/15 mL solution, Take 30 mL (  20 g total) by mouth Three (3) times a day., Disp: 240 mL, Rfl: 0    lancets 30 gauge Misc, Use as directed to test once daily., Disp: 100 each, Rfl: 3    metFORMIN (GLUCOPHAGE-XR) 500 MG 24 hr tablet, Take 2 tablets (1,000 mg total) by mouth in the morning and 2 tablets (1,000 mg total) in the evening. Take with meals., Disp: 360 tablet, Rfl: 1    pantoprazole (PROTONIX) 40 MG tablet, Take 1 tablet (40 mg total) by mouth Two (2) times a day., Disp: 180 tablet, Rfl: 1    pen needle, diabetic 32 gauge x 1/4 (6 mm) Ndle, Use as directed to inject Lantus, Disp: 100 each, Rfl: 3    simvastatin (ZOCOR) 20 MG tablet, Take 1 tablet (20 mg total) by mouth every evening., Disp: 90 tablet, Rfl: 1    spironolactone (ALDACTONE) 100 MG tablet, Take 1 tablet (100 mg total) by mouth daily., Disp: 30 tablet, Rfl: 2    zolpidem (AMBIEN) 5 MG tablet, Take 1 tablet (5 mg total) by mouth nightly as needed for sleep., Disp: , Rfl:     Allergies  Patient has no known allergies.    Family History  Family History   Problem Relation Age of Onset    Diabetes Mother     Cancer Mother     No Known Problems Father     No Known Problems Sister     Diabetes Maternal Grandmother     Hypertension Maternal Grandmother     Cancer Maternal Grandfather     Diabetes Maternal Grandfather     No Known Problems Paternal Grandmother     Skin cancer Paternal Grandfather     No Known Problems Brother     No Known Problems Maternal Aunt     No Known Problems Maternal Uncle     No Known Problems Paternal Aunt     No Known Problems Paternal Uncle     No Known Problems Other     Melanoma Neg Hx     Basal cell carcinoma Neg Hx     Squamous cell carcinoma Neg Hx     Substance Abuse Disorder Neg Hx     Alcohol abuse Neg Hx     Drug abuse Neg Hx     Mental illness Neg Hx     Amblyopia Neg Hx     Blindness Neg Hx     Cataracts Neg Hx     Glaucoma Neg Hx     Macular degeneration Neg Hx     Retinal detachment Neg Hx     Strabismus Neg Hx     Stroke Neg Hx     Thyroid disease Neg Hx     Breast cancer Neg Hx Social History  Social History     Tobacco Use    Smoking status: Never     Passive exposure: Current    Smokeless tobacco: Never   Vaping Use    Vaping Use: Never used   Substance Use Topics    Alcohol use: Not Currently     Comment: No longer    Drug use: Never        Physical Exam     VITAL SIGNS:      Vitals:    09/21/22 2254 09/21/22 2256 09/22/22 0348   BP:  135/53 134/65   Pulse:  77 76   Resp:  20 20   Temp:  36.7 ??C (98.1 ??F)    TempSrc:  Oral    SpO2:  99% 96%   Weight: (!) 124.1 kg (273 lb 9.6 oz)         Constitutional: Alert and oriented. No acute distress.  Eyes: Conjunctivae are normal.  HEENT: Normocephalic and atraumatic. Conjunctivae clear. No congestion. Moist mucous membranes.   Cardiovascular: Rate as above, regular rhythm. Normal and symmetric distal pulses. Brisk capillary refill.   Respiratory: Normal respiratory effort. Breath sounds are normal. There are no wheezing or crackles heard.  Gastrointestinal: Soft, moderately distended, diffusely tender without focality.  No rebound or guarding.  Not peritonitic.  Genitourinary: Deferred.  Musculoskeletal: Non-tender with normal range of motion in all extremities.  Symmetric bilateral lower extremity edema with mild pitting.  Neurologic: Normal speech and language. No gross focal neurologic deficits are appreciated. Patient is moving all extremities equally, face is symmetric at rest and with speech.  Skin: Skin is warm, dry and intact. No rash noted.  Psychiatric: Mood and affect are normal. Speech and behavior are normal.     Radiology     CT Abdomen Pelvis W IV Contrast Only   Final Result   No acute abnormality in the abdomen or pelvis.      Redemonstrated hepatic cirrhosis and sequela of portal hypertension with moderate volume ascites and diffuse body wall edema unchanged from prior.      Unchanged numerous small hypodense parenchymal nodules in the spleen, previously characterized on abdominal MRI as benign.         XR Chest Portable Final Result      No acute abnormalities.          Pertinent labs & imaging results that were available during my care of the patient were independently interpreted by me and considered in my medical decision making (see chart for details).    Portions of this record have been created using Scientist, clinical (histocompatibility and immunogenetics). Dictation errors have been sought, but may not have been identified and corrected.       Suzzanne Cloud, MD  Resident  09/22/22 0981       Suzzanne Cloud, MD  Resident  09/22/22 [phone]

## 2022-09-22 NOTE — Unmapped (Signed)
ED Procedure Note    Paracentesis    Date/Time: 09/22/2022 1:30 AM    Performed by: Suzzanne Cloud, MD  Authorized by: Penni Bombard, MD    Consent:     Consent obtained:  Verbal and written    Consent given by:  Patient    Risks, benefits, and alternatives were discussed: yes      Risks discussed:  Bleeding, bowel perforation, infection and pain  Universal protocol:     Patient identity confirmed:  Verbally with patient  Pre-procedure details:     Procedure purpose:  Diagnostic  Procedure details:     Needle gauge:  22    Ultrasound guidance: yes      Puncture site:  R lower quadrant    Fluid removed amount:  3    Fluid appearance:  Bloody and clear    Dressing:  4x4 sterile gauze  Post-procedure details:     Procedure completion:  Tolerated well, no immediate complications

## 2022-09-22 NOTE — Unmapped (Signed)
[facility] Medicine   History and Physical       Assessment and Plan     Ashley Briggs is a 56 y.o. female who is presenting to [facility] with Right upper quadrant abdominal pain, in the setting of the following pertinent/contributing co-morbidities: CAD, Type II DM c/b retinopathy and gastroparesis, HLD, HTN, plaque psoriasis, MASH cirrhosis c/b esophageal varaices,      RUQ Abdominal Pain, Nausea, and Vomiting: 2 to 3 weeks of right upper quadrant abdominal pain and nausea, with progression.  Associated abdominal distention and new bilateral lower extremity edema.  Differential includes progressive cirrhosis, portal venous thrombosis, heart failure, infection (she is immunosuppressed on Humira and at risk for atypical infection), hepatitis, biliary disease.  Notable findings include stable vital signs, absent leukocytosis, and stable hepatic function panel from prior.  CT scan without any acute processes.  She has some ascites, inconsistent with SBP on diagnostic paracentesis, but does show significant RBCs, perhaps sequelae of her prior abdominal trauma when she presented to the ED with fall earlier this month.  There is not sufficient fluid for large-volume paracentesis (see POCUS images below in objective).  Her cardiac function appears grossly normal on bedside ultrasound despite mildly elevated troponins and BNP.  Her symptoms are likely multifactorial, with nausea, vomiting, and abdominal pain dating back several years, however with rapid onset weight gain, bilateral lower extremity edema, I worry about progressive hepatic dysfunction versus portal vein thrombosis.  -Liver ultrasound with Doppler  -Lasix IV 20 mg x 1, monitor for response  -Pending Doppler, will likely initiate Lasix and spironolactone to help with fluid accumulation  -Follow-up ascitic fluid culture (no organisms seen on Gram stain)  -Treat gastroparesis as below and gastritis as below  -Low threshold to repeat formal echocardiogram    Decompensated Cirrhosis: Follows with [facility] liver clinic. Cirrhosis thought to be secondary to [facility] although no tissue dx or extended serologic w/u. Most recent endoscopy with Grade II esophageal varices and moderate portal hypertensive gastropathy.  No evidence of GI bleeding, labs stable.  She has only been taking her lactulose as needed and has felt a little more confused lately.  -Obtain INR to calculate MELD  -Daily MELD labs  -Resume lactulose 20 g 3 times daily, titrate to 3-5 soft bowel movements daily  -Continue carvedilol 6.25 mg twice daily  -Consider consultation to GI    Type 2 Diabetes Mellitus c/b retinopathy and gastroparesis: Follows with [facility] endocrinology. BG 90-131 since admission but with large glucosuria (on SGLT-2). A1c has crept back up to 9.8 earlier this month from 6.4 in February. May need to consider discontinuing Trulicity given worsening of gastroparesis.   -Continue home Lantus 66 U  -Sliding scale insulin  -Hold metformin, invokana, and Trulicity (next dose would be due 10/27).  -Trial gastroparesis diet - avoid fatty foods, high-insoluble fiber foods, counseled patient    Secondary/Additional Active Problems:    Depression and Anxiety: She was recently prescribed Ambien for sleep in addition to current prescription for clonazepam  -Continue Wellbutrin XL 150 mg daily  -Continue Celexa 40 mg daily  -Hold benzodiazepines, low threshold to start if symptoms of withdrawal    Gastritis - Duodenitis: has had prior biopsies demonstrating gastritis: Duodenal and gastric inflammation, negative for Helio factor.  -Continue Protonix twice daily  -Discontinue Celebrex and avoid other NSAIDs    QT Prolongation: QTc noted at 514 ms this AM. Is on Celexa at home. Will use antiemetics cautiously.   -Ondansetron 4 mg ODT  q12 hours PRN, low threshold to use benzodiazepines for antiemetics if requiring more frequent use.   -Repeat EKG tomorrow AM.    Iron deficiency: Continue ferrous sulfate, will give 325 mg every other day in place of home regiment of 142 mg twice daily.    CAD: Holding aspirin pending diagnostic work-up above.    Elevated Troponin: Troponin elevated to 39 with subsequent downtrend to 35.  EKG without concern for ischemia.  No chest pain.  Likely some demand troponin elevation.  She does have a systolic ejection murmur that has not been previously documented but may be benign, as well as elevated BNP.  Low threshold for repeat echocardiogram as above.    Plaque Psoriasis: On Humira at home, next dose is 10/4    Prophylaxis  -Lovenox    Diet  -Nutrition Therapy Regular/House; Sodium Restricted (No Added Salt); Fiber Restricted, Fat 50 gm    Code Status / HCDM  -Full Code, Discussed with patient at the time of admission   -  HCDM (patient stated preference): Caudle,Ryan - Relative - [phone]    Significant Comorbid Conditions:     -Thrombocytopenia POA requiring further investigation or monitor  -Anemia POA requiring further investigation or monitoring    Issues Impacting Complexity of Management:  -Intensive monitoring of drug toxicity from antiemetics in the setting of prolonged QT    Medical Decision Making: Reviewed records from the following unique sources PDMP, prior GI visits in the outpatient setting. Discussed the patient's management and/or test interpretation with ED Provider as summarized within this note    I personally spent greater than 90 minutes face-to-face and non-face-to-face in the care of this patient, which includes all pre, intra, and post visit time on the date of service.  All documented time was specific to the E/M visit and does not include any procedures that may have been performed.    HPI      Ashley Briggs is a 56 y.o. female who is presenting to [facility] with Right upper quadrant abdominal pain.    Patient had a fall onto her right side leading to an ED visit on 09/13/2022.  She states that since this admission, she has continued to have pain on the right side of her abdomen that spreads across the belly.  The pain is located primarily in her right upper quadrant, but does move across the middle and spreads towards her bellybutton and sometimes up into her chest.  The pain is fairly constant, but ebbs and flows in terms of its severity.  It is worse with food, and worse with movement.  She also reports it is worse with driving long distances.    The pain has been associated with some nausea, but no vomiting.  She reports occasional chills, sometimes feverish (the symptoms occur every 2 to 3 days), but she is never checked her temperature.  She has gained about 3 pounds in the last 24 hours, and she says her baseline weight was approximately 228.  She also reports associated swelling in her legs and feet that has cropped up over the last couple of weeks (she has never had issues with swelling before).  She also endorses a little bit more confusion the last couple weeks.  She gets winded easily.    She denies any burning with urination, has not had any bleeding, melena or hematochezia.  No sick contacts or recent travel.    Her only new medication is Ambien, which was added about 3 weeks  ago by her therapist, which she takes approximately 3 times per week.  She takes her lactulose only as needed and has had approximately 2 stools daily.      Med Rec Confidence   I reviewed the Medication List. The current list is Accurate per the patient.    Physical Exam   Temp:  [35.7 ??C (96.2 ??F)-36.8 ??C (98.2 ??F)] 35.7 ??C (96.2 ??F)  Heart Rate:  [69-77] 71  SpO2 Pulse:  [69-94] 94  Resp:  [18-20] 20  BP: (104-135)/(51-70) 119/70  SpO2:  [94 %-99 %] 94 %  Body mass index is 46.96 kg/m??.  General appearance - Well appearing, pleasant and cooperative.   Mental status - Alert and oriented to person, place and time.    Head - NCAT, EOMI  Chest - clear to auscultation bilaterally, no wheezes, rales or rhonchi  Heart - normal rate, regular rhythm, systolic ejection murmur. Gastrointestinal - abdomen soft, tender to palpation in the right upper quadrant only, mildly distended  Neurological - no focal neurological deficits.  No asterixis.  Musculoskeletal - no joint tenderness, deformity or swelling  Extremities - peripheral pulses normal, warm, 3+ bilateral pitting edema of the lower extremities  Skin - normal coloration, multiple large plaques with scaling, primarily along the lower abdomen and back as well as along the bra line.

## 2022-09-22 NOTE — Unmapped (Addendum)
56 year old Caucasian female with a history of CAD, Type II DM complicated by retinopathy and gastroparesis, HLD, HTN, plaque psoriasis, MASH cirrhosis complicated by esophageal varices who was admitted to the hospital for worsening right upper quadrant pain with nausea and vomiting and found to have increased ascites.  She did note that she has had abdominal pain going back as far as 2014 but has had a recent acute exacerbation.  She was subsequently admitted to the hospital for further evaluation and treatment.  In the emergency room, her vital signs were stable with normal oxygen saturation.    1.  Cirrhosis with ascites, abdominal pain.  Liver ultrasound with Doppler did not show portal vein thrombosis.  She subsequently underwent diagnostic paracentesis without evidence of spontaneous bacterial peritonitis.  Large-volume paracentesis was certainly considered but she was treated with intravenous furosemide and spironolactone in hopes of reducing the fluid burden without any further intervention.  She was seen in GI consultation for management of her ascites.  She does have a history of esophageal varices and is on beta-blockade with carvedilol 6.25 mg p.o. twice daily.  Echocardiogram was a technically difficult study but that showed left ventricular ejection fraction at over 55%, moderate left atrial enlargement and normal right ventricular size and function.  There was no evidence of heart failure as a contribution to her ascites.  CT scan of the abdomen and pelvis performed in the ER did not show any acute abnormalities.  The finding of hepatic cirrhosis was demonstrated and sequelae of portal hypertension was also seen.  No other significant incidental findings were reported.  Though her volume of ascites diminished as evidenced by her weight loss and her negative fluid balance, she continued to have enough ascites that large-volume paracentesis was performed on 10/28 yielding 3.3 L of ascites.  She was placed back on her home regimen of furosemide 40 mg daily and spironolactone 100 mg daily--though these were actually new prescriptions that she had not quite started prior to admission.  Despite this, she re-accumulated ascites rather quickly the following day so her furosemide and spironolactone were increased to 40 mg twice daily and 200 mg daily, respectively, to see if she could tolerate this***    She is followed with [facility] Hepatology clinic and as noted, cirrhosis thought to be secondary to [facility].  She has not had a biopsy.  Her most recent upper GI endoscopy did show grade 2 esophageal varices with moderate portal hypertensive gastropathy.  She did not have any hematemesis or coffee-ground emesis or other clinical concern for bleeding esophageal varices.  She is on aspirin 81 mg daily for her coronary artery disease and does use celecoxib 200 mg twice daily, but this was not continued during her hospitalization, and she was told to stop this.  She is on a proton pump inhibitor with pantoprazole for GI prophylaxis.    2.  Type 2 Diabetes Mellitus c/b retinopathy and gastroparesis:  Follows with [facility] endocrinology. BG 90-131 since admission but with large glucosuria (on SGLT-2).  A1c crept back up to 9.8 earlier this month from 6.4 in February.  Her home regimen was continued and adjusted as needed based upon blood sugars.    3.  History of coronary artery disease.  She did not complain of any chest pain or anginal equivalents.  She did have mild elevation in high-sensitivity troponin to 39 dropping to 35, likely due to mild strain, the upper limits of normal troponin is 34 and this was thought to have  no clinical significance.  Electrocardiogram did not show any acute ischemic changes.  See report and tracing for additional details.  Echocardiogram did not demonstrate any focal wall motion abnormalities.

## 2022-09-23 LAB — BASIC METABOLIC PANEL
ANION GAP: 7 mmol/L (ref 5–14)
BLOOD UREA NITROGEN: 8 mg/dL — ABNORMAL LOW (ref 9–23)
BUN / CREAT RATIO: 12
CALCIUM: 8.4 mg/dL — ABNORMAL LOW (ref 8.7–10.4)
CHLORIDE: 106 mmol/L (ref 98–107)
CO2: 26.3 mmol/L (ref 20.0–31.0)
CREATININE: 0.67 mg/dL
EGFR CKD-EPI (2021) FEMALE: >90 mL/min/{1.73_m2} (ref >=60)
GLUCOSE RANDOM: 140 mg/dL (ref 70–179)
POTASSIUM: 3.5 mmol/L (ref 3.4–4.8)
SODIUM: 139 mmol/L (ref 135–145)

## 2022-09-23 LAB — CBC
HEMATOCRIT: 30.7 % — ABNORMAL LOW (ref 34.0–44.0)
HEMOGLOBIN: 9.6 g/dL — ABNORMAL LOW (ref 11.3–14.9)
MEAN CORPUSCULAR HEMOGLOBIN CONC: 31.4 g/dL — ABNORMAL LOW (ref 32.0–36.0)
MEAN CORPUSCULAR HEMOGLOBIN: 24.3 pg — ABNORMAL LOW (ref 25.9–32.4)
MEAN CORPUSCULAR VOLUME: 77.3 fL — ABNORMAL LOW (ref 77.6–95.7)
MEAN PLATELET VOLUME: 8.3 fL (ref 6.8–10.7)
PLATELET COUNT: 138 10*9/L — ABNORMAL LOW (ref 150–450)
RED BLOOD CELL COUNT: 3.97 10*12/L (ref 3.95–5.13)
RED CELL DISTRIBUTION WIDTH: 17.3 % — ABNORMAL HIGH (ref 12.2–15.2)
WBC ADJUSTED: 4.3 10*9/L (ref 3.6–11.2)

## 2022-09-23 LAB — PROTIME-INR
INR: 1.31
PROTIME: 14.4 s — ABNORMAL HIGH (ref 9.9–12.6)

## 2022-09-23 LAB — HEPATIC FUNCTION PANEL
ALBUMIN: 2.4 g/dL — ABNORMAL LOW (ref 3.4–5.0)
ALKALINE PHOSPHATASE: 173 U/L — ABNORMAL HIGH (ref 46–116)
ALT (SGPT): 17 U/L (ref 10–49)
AST (SGOT): 52 U/L — ABNORMAL HIGH (ref <=34)
BILIRUBIN DIRECT: 0.9 mg/dL — ABNORMAL HIGH (ref 0.00–0.30)
BILIRUBIN TOTAL: 1.6 mg/dL — ABNORMAL HIGH (ref 0.3–1.2)
PROTEIN TOTAL: 6.6 g/dL (ref 5.7–8.2)

## 2022-09-23 LAB — MAGNESIUM: MAGNESIUM: 1.4 mg/dL — ABNORMAL LOW (ref 1.6–2.6)

## 2022-09-23 MED ADMIN — spironolactone (ALDACTONE) tablet 100 mg: 100 mg | ORAL | @ 12:00:00

## 2022-09-23 MED ADMIN — oxyCODONE (ROXICODONE) immediate release tablet 5 mg: 5 mg | ORAL | @ 07:00:00 | Stop: 2022-10-06

## 2022-09-23 MED ADMIN — enoxaparin (LOVENOX) syringe 40 mg: 40 mg | SUBCUTANEOUS | @ 02:00:00

## 2022-09-23 MED ADMIN — citalopram (CeleXA) tablet 40 mg: 40 mg | ORAL | @ 12:00:00

## 2022-09-23 MED ADMIN — insulin glargine (LANTUS) injection 66 Units: 66 [IU] | SUBCUTANEOUS | @ 02:00:00

## 2022-09-23 MED ADMIN — pantoprazole (PROTONIX) EC tablet 40 mg: 40 mg | ORAL | @ 02:00:00

## 2022-09-23 MED ADMIN — ferrous sulfate tablet 325 mg: 325 mg | ORAL | @ 12:00:00

## 2022-09-23 MED ADMIN — pantoprazole (PROTONIX) EC tablet 40 mg: 40 mg | ORAL | @ 14:00:00

## 2022-09-23 MED ADMIN — furosemide (LASIX) tablet 40 mg: 40 mg | ORAL | @ 14:00:00 | Stop: 2022-09-23

## 2022-09-23 MED ADMIN — oxyCODONE (ROXICODONE) immediate release tablet 5 mg: 5 mg | ORAL | @ 13:00:00 | Stop: 2022-10-06

## 2022-09-23 MED ADMIN — pravastatin (PRAVACHOL) tablet 40 mg: 40 mg | ORAL | @ 02:00:00

## 2022-09-23 MED ADMIN — insulin lispro (HumaLOG) injection 0-20 Units: 0-20 [IU] | SUBCUTANEOUS | @ 02:00:00

## 2022-09-23 MED ADMIN — lactulose oral solution: 20 g | ORAL | @ 17:00:00

## 2022-09-23 MED ADMIN — enoxaparin (LOVENOX) syringe 40 mg: 40 mg | SUBCUTANEOUS | @ 14:00:00

## 2022-09-23 MED ADMIN — carvediloL (COREG) tablet 6.25 mg: 6.25 mg | ORAL | @ 12:00:00

## 2022-09-23 MED ADMIN — lactulose oral solution: 20 g | ORAL | @ 14:00:00

## 2022-09-23 MED ADMIN — lactulose oral solution: 20 g | ORAL | @ 02:00:00

## 2022-09-23 MED ADMIN — carvediloL (COREG) tablet 6.25 mg: 6.25 mg | ORAL | @ 02:00:00

## 2022-09-23 MED ADMIN — buPROPion (WELLBUTRIN XL) 24 hr tablet 150 mg: 150 mg | ORAL | @ 12:00:00

## 2022-09-23 MED FILL — CARVEDILOL 6.25 MG TABLET: ORAL | 90 days supply | Qty: 180 | Fill #1

## 2022-09-23 MED FILL — ULTICARE PEN NEEDLE 32 GAUGE X 1/4" (6 MM): 90 days supply | Qty: 100 | Fill #1

## 2022-09-23 MED FILL — INSULIN GLARGINE (U-100) 100 UNIT/ML (3 ML) SUBCUTANEOUS PEN: SUBCUTANEOUS | 22 days supply | Qty: 15 | Fill #4

## 2022-09-23 NOTE — Unmapped (Signed)
VSS. Medicated for pain x 1 so far this shift. Tolerating medications well.   Problem: Adult Inpatient Plan of Care  Goal: Plan of Care Review  Outcome: Ongoing - Unchanged  Goal: Patient-Specific Goal (Individualized)  Outcome: Ongoing - Unchanged  Goal: Absence of Hospital-Acquired Illness or Injury  Outcome: Ongoing - Unchanged  Goal: Optimal Comfort and Wellbeing  Outcome: Ongoing - Unchanged  Goal: Readiness for Transition of Care  Outcome: Ongoing - Unchanged  Goal: Rounds/Family Conference  Outcome: Ongoing - Unchanged     Problem: Fall Injury Risk  Goal: Absence of Fall and Fall-Related Injury  Outcome: Ongoing - Unchanged

## 2022-09-23 NOTE — Unmapped (Signed)
Pt alert and oriented x4. Vss. Pain controlled overnight with prn oxycodone given per orders. Pt remains free from falls. Call bell within reach.       Problem: Adult Inpatient Plan of Care  Goal: Plan of Care Review  09/23/2022 0405 by Rebeca Allegra, RN  Outcome: Ongoing - Unchanged  09/22/2022 2242 by Rebeca Allegra, RN  Outcome: Ongoing - Unchanged  Goal: Patient-Specific Goal (Individualized)  09/23/2022 0405 by Rebeca Allegra, RN  Outcome: Ongoing - Unchanged  09/22/2022 2242 by Rebeca Allegra, RN  Outcome: Ongoing - Unchanged  Goal: Absence of Hospital-Acquired Illness or Injury  09/23/2022 0405 by Rebeca Allegra, RN  Outcome: Ongoing - Unchanged  09/22/2022 2242 by Rebeca Allegra, RN  Outcome: Ongoing - Unchanged  Intervention: Identify and Manage Fall Risk  Recent Flowsheet Documentation  Taken 09/22/2022 2000 by Rebeca Allegra, RN  Safety Interventions:   fall reduction program maintained   low bed   nonskid shoes/slippers when out of bed  Intervention: Prevent and Manage VTE (Venous Thromboembolism) Risk  Recent Flowsheet Documentation  Taken 09/22/2022 2000 by Rebeca Allegra, RN  Activity Management: activity adjusted per tolerance  Goal: Optimal Comfort and Wellbeing  09/23/2022 0405 by Rebeca Allegra, RN  Outcome: Ongoing - Unchanged  09/22/2022 2242 by Rebeca Allegra, RN  Outcome: Ongoing - Unchanged  Goal: Readiness for Transition of Care  09/23/2022 0405 by Rebeca Allegra, RN  Outcome: Ongoing - Unchanged  09/22/2022 2242 by Rebeca Allegra, RN  Outcome: Ongoing - Unchanged  Goal: Rounds/Family Conference  09/23/2022 0405 by Rebeca Allegra, RN  Outcome: Ongoing - Unchanged  09/22/2022 2242 by Rebeca Allegra, RN  Outcome: Ongoing - Unchanged     Problem: Fall Injury Risk  Goal: Absence of Fall and Fall-Related Injury  Outcome: Ongoing - Unchanged  Intervention: Promote Injury-Free Environment  Recent Flowsheet Documentation  Taken 09/22/2022 2000 by Rebeca Allegra, RN  Safety Interventions:   fall reduction program maintained   low bed   nonskid shoes/slippers when out of bed

## 2022-09-23 NOTE — Unmapped (Signed)
[facility]   Care Management     Patient is a 56 y.o. admitted on 09/21/2022 for Abdominal distention [R14.0]  Ascites due to alcoholic cirrhosis (CMS-HCC) [K70.31]. Per review of the medical record and discussion with the treating team, the patient does not meet indicators for a full assessment at this time. CM will continue to assess for discharge needs and follow up, as indicated.    Maurice Small September 23, 2022 9:49 AM

## 2022-09-24 LAB — MAGNESIUM: MAGNESIUM: 1.4 mg/dL — ABNORMAL LOW (ref 1.6–2.6)

## 2022-09-24 LAB — COMPREHENSIVE METABOLIC PANEL
ALBUMIN: 2.3 g/dL — ABNORMAL LOW (ref 3.4–5.0)
ALKALINE PHOSPHATASE: 164 U/L — ABNORMAL HIGH (ref 46–116)
ALT (SGPT): 19 U/L (ref 10–49)
ANION GAP: 8 mmol/L (ref 5–14)
AST (SGOT): 44 U/L — ABNORMAL HIGH (ref <=34)
BILIRUBIN TOTAL: 1.8 mg/dL — ABNORMAL HIGH (ref 0.3–1.2)
BLOOD UREA NITROGEN: 7 mg/dL — ABNORMAL LOW (ref 9–23)
BUN / CREAT RATIO: 13
CALCIUM: 8.6 mg/dL — ABNORMAL LOW (ref 8.7–10.4)
CHLORIDE: 107 mmol/L (ref 98–107)
CO2: 26.1 mmol/L (ref 20.0–31.0)
CREATININE: 0.56 mg/dL
EGFR CKD-EPI (2021) FEMALE: >90 mL/min/{1.73_m2} (ref >=60)
GLUCOSE RANDOM: 104 mg/dL (ref 70–179)
POTASSIUM: 3.6 mmol/L (ref 3.4–4.8)
PROTEIN TOTAL: 6.4 g/dL (ref 5.7–8.2)
SODIUM: 141 mmol/L (ref 135–145)

## 2022-09-24 LAB — URINALYSIS WITH MICROSCOPY
BILIRUBIN UA: NEGATIVE
GLUCOSE UA: >1000 — AB
KETONES UA: NEGATIVE
LEUKOCYTE ESTERASE UA: NEGATIVE
NITRITE UA: NEGATIVE
PH UA: 6 (ref 5.0–9.0)
PROTEIN UA: NEGATIVE
RBC UA: 2 /HPF (ref <=4)
SPECIFIC GRAVITY UA: 1.021 (ref 1.003–1.030)
SQUAMOUS EPITHELIAL: 8 /HPF — ABNORMAL HIGH (ref 0–5)
UROBILINOGEN UA: 4 — AB
WBC UA: 1 /HPF (ref 0–5)

## 2022-09-24 LAB — CBC
HEMATOCRIT: 30.6 % — ABNORMAL LOW (ref 34.0–44.0)
HEMOGLOBIN: 9.7 g/dL — ABNORMAL LOW (ref 11.3–14.9)
MEAN CORPUSCULAR HEMOGLOBIN CONC: 31.5 g/dL — ABNORMAL LOW (ref 32.0–36.0)
MEAN CORPUSCULAR HEMOGLOBIN: 24.4 pg — ABNORMAL LOW (ref 25.9–32.4)
MEAN CORPUSCULAR VOLUME: 77.4 fL — ABNORMAL LOW (ref 77.6–95.7)
MEAN PLATELET VOLUME: 7.7 fL (ref 6.8–10.7)
PLATELET COUNT: 130 10*9/L — ABNORMAL LOW (ref 150–450)
RED BLOOD CELL COUNT: 3.96 10*12/L (ref 3.95–5.13)
RED CELL DISTRIBUTION WIDTH: 17.5 % — ABNORMAL HIGH (ref 12.2–15.2)
WBC ADJUSTED: 4.3 10*9/L (ref 3.6–11.2)

## 2022-09-24 LAB — PROTIME-INR
INR: 1.3
PROTIME: 14.3 s — ABNORMAL HIGH (ref 9.9–12.6)

## 2022-09-24 MED ADMIN — zolpidem (AMBIEN) tablet 5 mg: 5 mg | ORAL | @ 02:00:00

## 2022-09-24 MED ADMIN — pantoprazole (PROTONIX) EC tablet 40 mg: 40 mg | ORAL | @ 13:00:00

## 2022-09-24 MED ADMIN — lactulose oral solution: 20 g | ORAL

## 2022-09-24 MED ADMIN — buPROPion (WELLBUTRIN XL) 24 hr tablet 150 mg: 150 mg | ORAL | @ 13:00:00

## 2022-09-24 MED ADMIN — insulin glargine (LANTUS) injection 66 Units: 66 [IU] | SUBCUTANEOUS

## 2022-09-24 MED ADMIN — oxyCODONE (ROXICODONE) immediate release tablet 5 mg: 5 mg | ORAL | @ 22:00:00 | Stop: 2022-10-06

## 2022-09-24 MED ADMIN — pravastatin (PRAVACHOL) tablet 40 mg: 40 mg | ORAL

## 2022-09-24 MED ADMIN — ondansetron (ZOFRAN-ODT) disintegrating tablet 4 mg: 4 mg | ORAL | @ 14:00:00

## 2022-09-24 MED ADMIN — carvediloL (COREG) tablet 6.25 mg: 6.25 mg | ORAL | @ 13:00:00

## 2022-09-24 MED ADMIN — furosemide (LASIX) injection 40 mg: 40 mg | INTRAVENOUS | @ 10:00:00 | Stop: 2022-09-24

## 2022-09-24 MED ADMIN — oxyCODONE (ROXICODONE) immediate release tablet 5 mg: 5 mg | ORAL | Stop: 2022-10-06

## 2022-09-24 MED ADMIN — enoxaparin (LOVENOX) syringe 40 mg: 40 mg | SUBCUTANEOUS | @ 13:00:00

## 2022-09-24 MED ADMIN — carvediloL (COREG) tablet 6.25 mg: 6.25 mg | ORAL

## 2022-09-24 MED ADMIN — spironolactone (ALDACTONE) tablet 100 mg: 100 mg | ORAL | @ 13:00:00

## 2022-09-24 MED ADMIN — pantoprazole (PROTONIX) EC tablet 40 mg: 40 mg | ORAL

## 2022-09-24 MED ADMIN — oxyCODONE (ROXICODONE) immediate release tablet 5 mg: 5 mg | ORAL | @ 14:00:00 | Stop: 2022-10-06

## 2022-09-24 MED ADMIN — ondansetron (ZOFRAN-ODT) disintegrating tablet 4 mg: 4 mg | ORAL | @ 22:00:00

## 2022-09-24 MED ADMIN — lactulose oral solution: 20 g | ORAL | @ 18:00:00

## 2022-09-24 MED ADMIN — lactulose oral solution: 20 g | ORAL | @ 13:00:00

## 2022-09-24 MED ADMIN — citalopram (CeleXA) tablet 40 mg: 40 mg | ORAL | @ 13:00:00

## 2022-09-24 MED ADMIN — insulin lispro (HumaLOG) injection 0-20 Units: 0-20 [IU] | SUBCUTANEOUS | @ 03:00:00

## 2022-09-24 MED ADMIN — enoxaparin (LOVENOX) syringe 40 mg: 40 mg | SUBCUTANEOUS

## 2022-09-24 NOTE — Unmapped (Signed)
Plan of care reviewed with pt. Pt taking meds as presribed. Pt did endorse some nausea and abdominal pain this morning, zofran and oxycodone administered. Pt had ECHO completed. Pt received dose of IV lasix early this morning, had good urine output. Pt independent with ADLs, will continue to monitor.      Problem: Adult Inpatient Plan of Care  Goal: Plan of Care Review  Outcome: Progressing  Goal: Patient-Specific Goal (Individualized)  Outcome: Progressing  Goal: Absence of Hospital-Acquired Illness or Injury  Outcome: Progressing  Intervention: Identify and Manage Fall Risk  Recent Flowsheet Documentation  Taken 09/24/2022 0800 by Elton Sin, BSN  Safety Interventions:   fall reduction program maintained   nonskid shoes/slippers when out of bed  Goal: Optimal Comfort and Wellbeing  Outcome: Progressing  Goal: Readiness for Transition of Care  Outcome: Progressing  Goal: Rounds/Family Conference  Outcome: Progressing     Problem: Fall Injury Risk  Goal: Absence of Fall and Fall-Related Injury  Outcome: Progressing  Intervention: Promote Scientist, clinical (histocompatibility and immunogenetics) Documentation  Taken 09/24/2022 0800 by Elton Sin, BSN  Safety Interventions:   fall reduction program maintained   nonskid shoes/slippers when out of bed     Problem: Diabetes Comorbidity  Goal: Blood Glucose Level Within Targeted Range  Outcome: Progressing     Problem: Hypertension Comorbidity  Goal: Blood Pressure in Desired Range  Outcome: Progressing     Problem: Fluid Volume Excess  Goal: Fluid Balance  Outcome: Progressing     Problem: Gastrointestinal Complications (Liver Failure)  Goal: Optimal Gastrointestinal Function  Outcome: Progressing     Problem: Pain (Liver Failure)  Goal: Optimal Pain Control  Outcome: Progressing

## 2022-09-24 NOTE — Unmapped (Signed)
[facility] Gastroenterology Consult Service   Initial Consultation         Assessment and Recommendations:   Ashley Briggs is a 56 y.o. female with PMH of CAD, DM c/b retinopathy and gastroparesis, HLD, HTN, plaque psoriasis, MASH cirrhosis (EV, HE, new onset ascites) who is presenting to [facility] with Right upper quadrant abdominal pain in the setting of abdominal distention and new onset ascites.    MASLD associated Cirrhosis  Follows with Dr. Eudelia Bunch last seen in clinic 06/16/2022.  New onset abdominal pain in the setting of abdominal ascites which is new for her.  She additionally has lower extremity edema.  She has an increase in weight likely water weight. When last seen she was noted to be without ascites or a prior need for diuretic therapy. She was prescribed this in the outpatient setting but had not started taking it yet. Liver U/S with doppler is negative for acute thrombosis. CTAP does not have an acute abnormality in the abdomen or pelvis. She has moderate volume ascites and diffuse body wall edema. Diagnostic paracentesis performed in the ED which was negative for SBP. She denies any infectious symptoms or dietary indiscretion. Unclear precipitant other than continued progression of her liver disease and worsening portal hypertension. Appears her MELD is stable from prior checks. Of note patient also mentioned she was started on humira for her psoriasis.  --Daily CMP, CBC, INR  - Nutrition consult with goal 1.2kcal of protein daily.  - Infxs work up: Blood Cx, UA, UCx    Volume l Ascites  --Primary team will inquire with IR if LVP is done here. Please let GI know prior to doing for further recommendations  --IV diuresis 40 lasix IV, goal would be net negative 1L  --Can continue newly prescribed Cleda Daub 100mg   --Low Na diet <2 g daily  --TTE    HCC  Screening complete. MRI completed in 05/2022 without suspicious lesion. Liver ultrasound here without suspicious lesion. Would stick to MRI q 6 month schedule next due 12/2021    Varices  Last EGD completed 05/05/22  G2EV PHG with large food residue  On Coreg for prophylaxis, continue    HE  No significant mental status changes or asterixis. Noted tremors in clinic. Was supposed to be taking lactulose but was taking improperly and only PRN  --Continue home lactulose and titrate to 3-5 Bms daily    I have communicated recommendations with the patient's primary team    Thank you for involving [place] in the care of your patient. We will continue to follow along with you.    For questions, contact the on-call fellow for the [facility] Gastroenterology Consult Service at [phone] (available 8AM-5PM only, excluding weekends and holidays).     I saw and evaluated the patient, participating in the key portions of the service.  I reviewed the resident???s note.  I agree with the resident???s findings and plan. Abdomen is soft, distended, nontender. Ashley Nan, MD, PhD         Reason for Consultation:   The patient is seen in consultation at the request of Dwana Curd, MD (Med Undesignated (MDX)) for new onset and worsening ascites.    Subjective:   HPI:  Patient notes that for 2 weeks she started having right-sided abdominal pain and bandlike pain over the top of her abdomen in the setting of worsening abdominal distention and lower extremity edema.  She additionally remarkably had a fall about 2-1/2 weeks ago that could have also  contributed to her worsening pain.  She remarks that her pain is worse with sitting up or walking.  She has never had quite like this abdominal distention or lower extremity edema.  She remarks having put on a significant amount of weight which is likely water weight.  She has been chronically nauseated prior to this episode but reports this is likely in relation to her gastroparesis.  She otherwise denies having any fevers, chills, night sweats.  She denies having any alcohol intake or excessive Tylenol use.  She denies having sudden increase in her dietary salt intake.  She had reached out to her liver doctor with what appears to be on 10/18 for which she was prescribed outpatient diuretics but had not started those yet.  She denies having any significant recent confusion or worsening shakiness.  She has been taking her lactulose only as needed.  She has not been adequately titrating it to 3-5 bowel movements per day.    She denies having any emesis, hematemesis, diarrhea, melena, bright red blood per rectum.    Patient denies any alcohol use, tobacco use, recreational drugs.    Objective:   Physical Exam:  Temp:  [36 ??C (96.8 ??F)-36.8 ??C (98.2 ??F)] 36 ??C (96.8 ??F)  Heart Rate:  [70-80] 70  Resp:  [18-20] 20  BP: (101-137)/(49-62) 101/49  SpO2:  [94 %-99 %] 97 %    Gen: Chronically ill-appearing female in NAD, answers questions appropriately  Eyes: Sclera anicteric  Abdomen: Normoactive bowel sounds, distended with psoriatic plaques on the abdomen, mild TTP throughout worse in the RUQ.   Extremities: No clubbing, cyanosis, or edema in the BLEs  Neuro: Normal speech. No asterixis.   Skin: Psoriatic plaques on elbows an abdomen.  Psych: Alert, normal mood and affect.     Pertinent Labs/Studies Reviewed:  MELD 3.0: 14 at 09/23/2022  7:08 AM  MELD-Na: 11 at 09/23/2022  7:08 AM  Calculated from:  Serum Creatinine: 0.67 mg/dL (Using min of 1 mg/dL) at 45/40/9811  9:14 AM  Serum Sodium: 139 mmol/L (Using max of 137 mmol/L) at 09/23/2022  7:08 AM  Total Bilirubin: 1.6 mg/dL at 78/29/5621  3:08 AM  Serum Albumin: 2.4 g/dL at 65/78/4696  2:95 AM  INR(ratio): 1.31 at 09/23/2022  7:08 AM  Age at listing (hypothetical): 55 years  Sex: Female at 09/23/2022  7:08 AM

## 2022-09-24 NOTE — Unmapped (Signed)
Pt alert and oriented x4. Vss. Pt cooperative with plan of care. Pain controlled with prn oxycodone given per orders. Call bell within reach.  Problem: Adult Inpatient Plan of Care  Goal: Plan of Care Review  Outcome: Ongoing - Unchanged  Goal: Patient-Specific Goal (Individualized)  Outcome: Ongoing - Unchanged  Goal: Absence of Hospital-Acquired Illness or Injury  Outcome: Ongoing - Unchanged  Intervention: Identify and Manage Fall Risk  Recent Flowsheet Documentation  Taken 09/23/2022 2000 by Rebeca Allegra, RN  Safety Interventions:   fall reduction program maintained   low bed   nonskid shoes/slippers when out of bed  Intervention: Prevent and Manage VTE (Venous Thromboembolism) Risk  Recent Flowsheet Documentation  Taken 09/23/2022 2000 by Rebeca Allegra, RN  Activity Management: activity adjusted per tolerance  Goal: Optimal Comfort and Wellbeing  Outcome: Ongoing - Unchanged  Goal: Readiness for Transition of Care  Outcome: Ongoing - Unchanged  Goal: Rounds/Family Conference  Outcome: Ongoing - Unchanged     Problem: Fall Injury Risk  Goal: Absence of Fall and Fall-Related Injury  Outcome: Ongoing - Unchanged  Intervention: Promote Injury-Free Environment  Recent Flowsheet Documentation  Taken 09/23/2022 2000 by Rebeca Allegra, RN  Safety Interventions:   fall reduction program maintained   low bed   nonskid shoes/slippers when out of bed

## 2022-09-24 NOTE — Unmapped (Addendum)
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 *           56 year old female with cirrhosis admitted for what upper quadrant pain, increased ascites.  Appreciate Gastroenterology consultation.  -IV Lasix 40 mg in AM.  -Continue Aldactone 100 mg p.o. daily.  - Large dose paracentesis, check with VIR in AM.    All other chronic issues as above are stable and not requiring acute management today.      *DVT prophylaxis: Subcutaneous Lovenox.    *Disposition: She will require over 2 midnights in the hospital.      Please page the [facility]) pager at [phone] with questions.      Consultants:   1.  Gastroenterology, case reviewed with Gastroenterologist      Subjective:   Still with right upper quadrant pain although somewhat improved.  No complaint of nausea, vomiting, hematemesis or coffee-ground emesis.    Objective:       Vital signs in last 24 hours:  Temp:  [36 ??C (96.8 ??F)-36.8 ??C (98.2 ??F)] 36 ??C (96.8 ??F)  Heart Rate:  [70-80] 70  Resp:  [18-20] 20  BP: (101-122)/(49-58) 101/49  MAP (mmHg):  [71] 71  SpO2:  [94 %-97 %] 97 %    Intake/Output last 24 hours:  No intake or output data in the 24 hours ending 09/23/22 1848      Physical Exam:    Gen: Alert, NAD, pleasant and conversant    HEENT: No icterus.    CV: Regular rate and rhythm.    PULM/chest: Clear to auscultation.    AVW:UJWJ, mild distention, right upper quadrant tenderness.  Exam consistent with moderate volume ascites and body wall edema.    Ext: No C/C/E    Neuro: No acute focal deficits.    Skin: Warm, dry.  Extensive psoriasis plaques.  Spider angiomata on upper chest.      Medications:   Scheduled Meds:  ??? buPROPion  150 mg Oral Daily   ??? carvediloL  6.25 mg Oral BID   ??? citalopram  40 mg Oral Daily   ??? enoxaparin (LOVENOX) injection  40 mg Subcutaneous Q12H   ??? ferrous sulfate  325 mg Oral Every other day   ??? [START ON 09/24/2022] furosemide  40 mg Intravenous Once   ??? insulin glargine  66 Units Subcutaneous Nightly   ??? insulin lispro  0-20 Units Subcutaneous ACHS   ??? lactulose  20 g Oral TID   ??? pantoprazole  40 mg Oral BID   ??? pravastatin  40 mg Oral Nightly   ??? spironolactone  100 mg Oral Daily     Continuous Infusions:    Lab Results   Component Value Date    WBC 4.3 09/23/2022    HGB 9.6 (L) 09/23/2022    HCT  30.7 (L) 09/23/2022    PLT 138 (L) 09/23/2022       Lab Results   Component Value Date    NA 139 09/23/2022    K 3.5 09/23/2022    CL 106 09/23/2022    CO2 26.3 09/23/2022    BUN 8 (L) 09/23/2022    CREATININE 0.67 09/23/2022    GLU 140 09/23/2022    CALCIUM 8.4 (L) 09/23/2022    MG 1.4 (L) 09/23/2022       Lab Results   Component Value Date    BILITOT 1.6 (H) 09/23/2022    BILIDIR 0.90 (H) 09/23/2022    PROT 6.6 09/23/2022    ALBUMIN 2.4 (L) 09/23/2022    ALT 17 09/23/2022    AST 52 (H) 09/23/2022    ALKPHOS 173 (H) 09/23/2022       Lab Results   Component Value Date    PT 14.4 (H) 09/23/2022    INR 1.31 09/23/2022    APTT 33.6 09/13/2022     Recent Labs     09/21/22  2319 09/23/22  0708   NA 143 139   K 3.8 3.5   CL 111* 106   CO2 24.8 26.3   BUN 7* 8*   CREATININE 0.59* 0.67   GLU 131 140   CALCIUM 8.9 8.4*   ALBUMIN 2.6* 2.4*   PROT 7.1 6.6   BILITOT 1.2 1.6*   AST 60* 52*   ALT 23 17   ALKPHOS 194* 173*   MG  --  1.4*   LIPASE 57*  --      Recent Labs     09/22/22  0006 09/22/22  0120 09/22/22  0546 09/23/22  0708   TROPONINI  --    < > 35*  --    BNP 109.96*  --   --   --    INR  --   --   --  1.31    < > = values in this interval not displayed.     Recent Labs     09/22/22  0042   WBCUA 5   NITRITE Negative   LEUKOCYTESUR Negative   BACTERIA None Seen   RBCUA 7*   BLOODU Negative   GLUCOSEU >1000 mg/dL*   PROTEINUA Negative   KETONESU Negative     No results for input(s): OPIAU, BENZU, TRICYCLIC, PCPU, AMPHU, COCAU, CANNAU, BARBU, ETOH, ACETAMIN, SALICYLATE in the last 72 hours.  No results for input(s): PREGTESTUR, PREGPOC in the last 72 hours.  No results for input(s): OCCULTBLD, RAPSCRN, CDIFRPCR, CDIFFNAP1, A1C in the last 72 hours.  No results for input(s): O2SOUR, FIO2ART, PHART, PCO2ART, PO2ART, HCO3ART, O2SATART, BEART in the last 72 hours.  Pending labs:   Pending Labs     Order Current Status    Ascitic/Peritoneal Fluid Culture Preliminary result            I personally spent greater than 25 minutes face-to-face and non-face-to-face in the care of this patient, which includes all pre, intra, and post visit time on the date of service.  All documented time was specific to the E/M visit and does not include any procedures that may have been performed.

## 2022-09-24 NOTE — Unmapped (Signed)
Attempted to contact patient with telephone number on file. No answer. Voicemail box full. Unable to leave message.     Used alternate contact person, Alycia Rossetti. Confirmed patient's name and DOB. She reports that the patient is currently admitted to [facility]. Asked Alycia Rossetti to pass along she is due for a follow up with Dr. Amalia Hailey for her diabetes

## 2022-09-25 LAB — BASIC METABOLIC PANEL
ANION GAP: 7 mmol/L (ref 5–14)
BLOOD UREA NITROGEN: 6 mg/dL — ABNORMAL LOW (ref 9–23)
BUN / CREAT RATIO: 12
CALCIUM: 8.8 mg/dL (ref 8.7–10.4)
CHLORIDE: 108 mmol/L — ABNORMAL HIGH (ref 98–107)
CO2: 28.2 mmol/L (ref 20.0–31.0)
CREATININE: 0.51 mg/dL — ABNORMAL LOW
EGFR CKD-EPI (2021) FEMALE: >90 mL/min/{1.73_m2} (ref >=60)
GLUCOSE RANDOM: 55 mg/dL — ABNORMAL LOW (ref 70–179)
POTASSIUM: 3.3 mmol/L — ABNORMAL LOW (ref 3.4–4.8)
SODIUM: 143 mmol/L (ref 135–145)

## 2022-09-25 LAB — MAGNESIUM: MAGNESIUM: 1.6 mg/dL (ref 1.6–2.6)

## 2022-09-25 LAB — PROTIME-INR
INR: 1.25
PROTIME: 13.8 s — ABNORMAL HIGH (ref 9.9–12.6)

## 2022-09-25 MED ADMIN — lactulose oral solution: 20 g | ORAL

## 2022-09-25 MED ADMIN — lactulose oral solution: 20 g | ORAL | @ 18:00:00

## 2022-09-25 MED ADMIN — enoxaparin (LOVENOX) syringe 40 mg: 40 mg | SUBCUTANEOUS

## 2022-09-25 MED ADMIN — insulin lispro (HumaLOG) injection 0-20 Units: 0-20 [IU] | SUBCUTANEOUS | @ 23:00:00

## 2022-09-25 MED ADMIN — ferrous sulfate tablet 325 mg: 325 mg | ORAL | @ 12:00:00

## 2022-09-25 MED ADMIN — citalopram (CeleXA) tablet 40 mg: 40 mg | ORAL | @ 12:00:00

## 2022-09-25 MED ADMIN — carvediloL (COREG) tablet 6.25 mg: 6.25 mg | ORAL | @ 12:00:00

## 2022-09-25 MED ADMIN — pravastatin (PRAVACHOL) tablet 40 mg: 40 mg | ORAL

## 2022-09-25 MED ADMIN — insulin glargine (LANTUS) injection 66 Units: 66 [IU] | SUBCUTANEOUS

## 2022-09-25 MED ADMIN — pantoprazole (PROTONIX) EC tablet 40 mg: 40 mg | ORAL | @ 12:00:00

## 2022-09-25 MED ADMIN — insulin lispro (HumaLOG) injection 0-20 Units: 0-20 [IU] | SUBCUTANEOUS | @ 03:00:00

## 2022-09-25 MED ADMIN — oxyCODONE (ROXICODONE) immediate release tablet 5 mg: 5 mg | ORAL | @ 21:00:00 | Stop: 2022-10-06

## 2022-09-25 MED ADMIN — pantoprazole (PROTONIX) EC tablet 40 mg: 40 mg | ORAL

## 2022-09-25 MED ADMIN — buPROPion (WELLBUTRIN XL) 24 hr tablet 150 mg: 150 mg | ORAL | @ 12:00:00

## 2022-09-25 MED ADMIN — insulin lispro (HumaLOG) injection 0-20 Units: 0-20 [IU] | SUBCUTANEOUS | @ 16:00:00

## 2022-09-25 MED ADMIN — spironolactone (ALDACTONE) tablet 100 mg: 100 mg | ORAL | @ 12:00:00

## 2022-09-25 MED ADMIN — ondansetron (ZOFRAN-ODT) disintegrating tablet 4 mg: 4 mg | ORAL | @ 21:00:00

## 2022-09-25 MED ADMIN — lactulose oral solution: 20 g | ORAL | @ 12:00:00

## 2022-09-25 MED ADMIN — furosemide (LASIX) injection 40 mg: 40 mg | INTRAVENOUS | @ 18:00:00 | Stop: 2022-09-25

## 2022-09-25 MED ADMIN — enoxaparin (LOVENOX) syringe 40 mg: 40 mg | SUBCUTANEOUS | @ 12:00:00

## 2022-09-25 MED ADMIN — furosemide (LASIX) injection 40 mg: 40 mg | INTRAVENOUS | @ 12:00:00 | Stop: 2022-09-25

## 2022-09-25 MED ADMIN — zolpidem (AMBIEN) tablet 5 mg: 5 mg | ORAL

## 2022-09-25 NOTE — Unmapped (Signed)
Pt vss. Alert and oriented x4. Remains free from falls. No complaints overnight. Call bell within reach.   Problem: Adult Inpatient Plan of Care  Goal: Plan of Care Review  Outcome: Ongoing - Unchanged  Goal: Patient-Specific Goal (Individualized)  Outcome: Ongoing - Unchanged  Goal: Absence of Hospital-Acquired Illness or Injury  Outcome: Ongoing - Unchanged  Intervention: Identify and Manage Fall Risk  Recent Flowsheet Documentation  Taken 09/24/2022 2000 by Rebeca Allegra, RN  Safety Interventions:   fall reduction program maintained   low bed   nonskid shoes/slippers when out of bed  Intervention: Prevent and Manage VTE (Venous Thromboembolism) Risk  Recent Flowsheet Documentation  Taken 09/24/2022 2000 by Rebeca Allegra, RN  Activity Management: activity adjusted per tolerance  Goal: Optimal Comfort and Wellbeing  Outcome: Ongoing - Unchanged  Goal: Readiness for Transition of Care  Outcome: Ongoing - Unchanged  Goal: Rounds/Family Conference  Outcome: Ongoing - Unchanged     Problem: Fall Injury Risk  Goal: Absence of Fall and Fall-Related Injury  Outcome: Ongoing - Unchanged  Intervention: Promote Injury-Free Environment  Recent Flowsheet Documentation  Taken 09/24/2022 2000 by Rebeca Allegra, RN  Safety Interventions:   fall reduction program maintained   low bed   nonskid shoes/slippers when out of bed     Problem: Diabetes Comorbidity  Goal: Blood Glucose Level Within Targeted Range  Outcome: Ongoing - Unchanged     Problem: Hypertension Comorbidity  Goal: Blood Pressure in Desired Range  Outcome: Ongoing - Unchanged     Problem: Fluid Volume Excess  Goal: Fluid Balance  Outcome: Ongoing - Unchanged     Problem: Gastrointestinal Complications (Liver Failure)  Goal: Optimal Gastrointestinal Function  Outcome: Ongoing - Unchanged     Problem: Pain (Liver Failure)  Goal: Optimal Pain Control  Outcome: Ongoing - Unchanged

## 2022-09-25 NOTE — Unmapped (Signed)
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 *           56 year old female with cirrhosis admitted for what upper quadrant pain, increased ascites.  Appreciate Gastroenterology consultation.  - Lasix 40 mg IV daily  -Continue Aldactone 100 mg p.o. daily.    -Consider large-volume paracentesis, able to be done in VIR, if diuresis sufficient to reduce ascites.  -No growth on diagnostic paracentesis.    All other chronic issues as above are stable and not requiring acute management today.  -Type 2 diabetes, blood sugars well controlled.  -Hypertension, blood sugar well controlled.  -Coronary artery disease, asymptomatic, no chest pain or anginal equivalents.  -History of esophageal varices, no evidence of GI bleeding.          *DVT prophylaxis: Subcutaneous Lovenox.    *Disposition: She will require over 2 midnights in the hospital.      Please page the [facility]) pager at [phone] with questions.      Consultants:   1.  Gastroenterology, case reviewed with Gastroenterologist      Subjective:   Right upper quadrant pain improved.  She notes increased urination with Lasix.    Objective:       Vital signs in last 24 hours:  Temp:  [35.9 ??C (96.6 ??F)-37.1 ??C (98.8 ??F)] 35.9 ??C (96.6 ??F)  Heart Rate:  [71-82] 74  Resp:  [18-20] 18  BP: (107-126)/(54-65) 119/58  MAP (mmHg):  [77-84] 84  SpO2:  [94 %-97 %] 94 %    Intake/Output last 24 hours:    Intake/Output Summary (Last 24 hours) at 09/24/2022 1956  Last data filed at 09/24/2022 1602  Gross per 24 hour   Intake --   Output 1800 ml   Net -1800 ml         Physical Exam:    Gen: Alert, NAD, pleasant and conversant    HEENT: No icterus.    CV: Regular rate and rhythm.    PULM/chest: Clear to auscultation.    AVW:UJWJ, mild distention, right upper quadrant tenderness.  Exam consistent with moderate volume ascites and body wall edema.    Ext: No C/C/E    Neuro: No acute focal deficits.    Skin: Warm, dry.  Extensive psoriasis plaques.  Spider angiomata on upper chest.      Medications:   Scheduled Meds:  ??? buPROPion  150 mg Oral Daily   ??? carvediloL  6.25 mg Oral BID   ??? citalopram  40 mg Oral Daily   ??? enoxaparin (LOVENOX) injection  40 mg Subcutaneous Q12H   ??? ferrous sulfate  325 mg Oral Every other day   ??? insulin glargine  66 Units Subcutaneous Nightly   ??? insulin lispro  0-20 Units Subcutaneous ACHS   ??? lactulose  20 g Oral  TID   ??? pantoprazole  40 mg Oral BID   ??? pravastatin  40 mg Oral Nightly   ??? spironolactone  100 mg Oral Daily     Continuous Infusions:    Lab Results   Component Value Date    WBC 4.3 09/24/2022    HGB 9.7 (L) 09/24/2022    HCT 30.6 (L) 09/24/2022    PLT 130 (L) 09/24/2022       Lab Results   Component Value Date    NA 141 09/24/2022    K 3.6 09/24/2022    CL 107 09/24/2022    CO2 26.1 09/24/2022    BUN 7 (L) 09/24/2022    CREATININE 0.56 09/24/2022    GLU 104 09/24/2022    CALCIUM 8.6 (L) 09/24/2022    MG 1.4 (L) 09/24/2022       Lab Results   Component Value Date    BILITOT 1.8 (H) 09/24/2022    BILIDIR 0.90 (H) 09/23/2022    PROT 6.4 09/24/2022    ALBUMIN 2.3 (L) 09/24/2022    ALT 19 09/24/2022    AST 44 (H) 09/24/2022    ALKPHOS 164 (H) 09/24/2022       Lab Results   Component Value Date    PT 14.3 (H) 09/24/2022    INR 1.30 09/24/2022    APTT 33.6 09/13/2022     Recent Labs     09/21/22  2319 09/23/22  0708 09/24/22  0706   NA 143   < > 141   K 3.8   < > 3.6   CL 111* < > 107   CO2 24.8   < > 26.1   BUN 7*   < > 7*   CREATININE 0.59*   < > 0.56   GLU 131   < > 104   CALCIUM 8.9   < > 8.6*   ALBUMIN 2.6*   < > 2.3*   PROT 7.1   < > 6.4   BILITOT 1.2   < > 1.8*   AST 60*   < > 44*   ALT 23   < > 19   ALKPHOS 194*   < > 164*   MG  --    < > 1.4*   LIPASE 57*  --   --     < > = values in this interval not displayed.     Recent Labs     09/22/22  0006 09/22/22  0120 09/22/22  0546 09/23/22  0708 09/24/22  0706   TROPONINI  --    < > 35*  --   --    BNP 109.96*  --   --   --   --    INR  --   --   --    < > 1.30    < > = values in this interval not displayed.     Recent Labs     09/22/22  0042   WBCUA 5   NITRITE Negative   LEUKOCYTESUR Negative   BACTERIA None Seen   RBCUA 7*   BLOODU Negative   GLUCOSEU >1000 mg/dL*   PROTEINUA Negative   KETONESU Negative   Pending labs:   Pending Labs     Order Current Status    Ascitic/Peritoneal Fluid Culture Preliminary result            I personally spent greater than 25 minutes face-to-face and non-face-to-face in the care of this patient, which includes all  pre, intra, and post visit time on the date of service.  All documented time was specific to the E/M visit and does not include any procedures that may have been performed.

## 2022-09-25 NOTE — Unmapped (Signed)
[facility] Gastroenterology Consult Service   Consultation         Assessment and Recommendations:   Ashley Briggs is a 56 y.o. female with PMH of CAD, DM c/b retinopathy and gastroparesis, HLD, HTN, plaque psoriasis, MASH cirrhosis (EV, HE, new onset ascites) who is presenting to [facility] with Right upper quadrant abdominal pain in the setting of abdominal distention and new onset ascites.    MASLD associated Cirrhosis  Follows with Dr. Eudelia Bunch last seen in clinic 06/16/2022.  New onset abdominal pain in the setting of abdominal ascites which is new for her.  She additionally has lower extremity edema.  She has an increase in weight likely water weight. When last seen she was noted to be without ascites or a prior need for diuretic therapy. She was prescribed this in the outpatient setting but had not started taking it yet. Liver U/S with doppler is negative for acute thrombosis. CTAP does not have an acute abnormality in the abdomen or pelvis. She has moderate volume ascites and diffuse body wall edema. Diagnostic paracentesis performed in the ED which was negative for SBP. She denies any infectious symptoms or dietary indiscretion. Unclear precipitant other than continued progression of her liver disease and worsening portal hypertension. Appears her MELD is stable from prior checks. Of note patient also mentioned she was started on humira for her psoriasis.  --Daily CMP, CBC, INR  - Nutrition consult with goal 1.2kcal of protein daily.  - Infxs work up: Blood Cx, UA, UCx    Volume l Ascites  --Primary team will inquire with IR if LVP is done here. Please let GI know prior to doing for further recommendations  --IV diuresis 40 lasix IV, goal would be net negative 1L initially  --Can continue newly prescribed Cleda Daub 100mg   --Low Na diet <2 g daily  --TTE was unremarkable    HCC  Screening complete. MRI completed in 05/2022 without suspicious lesion. Liver ultrasound here without suspicious lesion. Would stick to MRI q 6 month schedule next due 12/2021    Varices  Last EGD completed 05/05/22  G2EV PHG with large food residue  On Coreg for prophylaxis, continue    HE  No significant mental status changes or asterixis. Noted tremors in clinic. Was supposed to be taking lactulose but was taking improperly and only PRN  --Continue home lactulose and titrate to 3-5 Bms daily    I have communicated recommendations with the patient's primary team    Thank you for involving [place] in the care of your patient. We will continue to follow along with you.    For questions, contact the on-call fellow for the [facility] Gastroenterology Consult Service at [phone] (available 8AM-5PM only, excluding weekends and holidays).     Reason for Consultation:   The patient is seen in consultation at the request of Rica Koyanagi, MD (Med Undesignated (MDX)) for new onset and worsening ascites.    Subjective:   Interval Events:  Patient has had some UOP but abdomen continues to be significantly distended without much improvement. Will try medical management first for her ascites and if refractory may require and LVP which can ben done at the main campus. She mentioned some blood in her urine, which the primary team is working up.    Objective:   Physical Exam:  Temp:  [35.9 ??C (96.6 ??F)-37.1 ??C (98.8 ??F)] 35.9 ??C (96.6 ??F)  Heart Rate:  [71-82] 74  Resp:  [18-20] 18  BP: (107-126)/(54-65) 119/58  SpO2:  [  94 %-97 %] 94 %    Gen: Chronically ill-appearing female in NAD, answers questions appropriately  Eyes: Sclera anicteric  Abdomen: Normoactive bowel sounds, distended with psoriatic plaques on the abdomen, mild TTP throughout worse in the RUQ.   Extremities: No clubbing, cyanosis 1+ pitting edema to knees bilaterally  Neuro: Normal speech. No asterixis.   Skin: Psoriatic plaques on elbows an abdomen.  Psych: Alert, normal mood and affect.     Pertinent Labs/Studies Reviewed:  MELD 3.0: 15 at 09/24/2022  7:06 AM  MELD-Na: 12 at 09/24/2022  7:06 AM  Calculated from:  Serum Creatinine: 0.56 mg/dL (Using min of 1 mg/dL) at 09/81/1914  7:82 AM  Serum Sodium: 141 mmol/L (Using max of 137 mmol/L) at 09/24/2022  7:06 AM  Total Bilirubin: 1.8 mg/dL at 95/62/1308  6:57 AM  Serum Albumin: 2.3 g/dL at 84/69/6295  2:84 AM  INR(ratio): 1.30 at 09/24/2022  7:06 AM  Age at listing (hypothetical): 55 years  Sex: Female at 09/24/2022  7:06 AM

## 2022-09-25 NOTE — Unmapped (Signed)
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 *         *RUQ abd pain.  Possibly 2/2 ascites.  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  Liver US w Doppler w no evidence of thrombus; moderate ascites seen.  No evidence for SBP on diagnostic paracentesis.  Abd CT w cirrhosis, ascites, no other pathology.  Gallbladder previously removed.  TTE w preserved EF.  POCUS today by me shows mod ascites though not as much as I was expecting; only area potentially safe for tapping is on R though liver comes into view.  -Continue furosemide IV but will try increasing to 40mg  IV BID today.  -Continue spironolactone 100mg  qday.  -Will consider LVP if daily POCUS does not show improvement (fast enough).  -Continue carvedilol.  -Continue lactulose.  -For now will allow oxycodone PRN.  -Watch Mg  -Daily wt, strict I/O.  -Appreciate GI???s assistance.     *H/O gastritis, duodenitis.  -Continue PPI.  -Avoid NSAIDs.     *DM2, uncontrolled w A1c 9.8%.  DM gastroparesis.  -Continue glargine but decrease to 60 units at bedtime.  -Hold nonformulary home canagliflozin.  -Holding home ASA.     *Psoriasis.  -Can resume adulimumab at home after DC.     *Fe deficiency anemia.  -Continue Fe.     *Depression, anxiety.  -Continue bupropion, citalopram.     *DVT prophylaxis.  -Enoxaparin.     *Dispo.  Pending above.       Please page the [facility]) pager at [phone] with questions.      Consultants:   1.  GI        Pending labs:   Pending Labs       Order Current Status    Ascitic/Peritoneal Fluid Culture Preliminary result              Subjective:   Ondansetron x 2, oxycodone x 2, zolpidem x 1 in last 24 hrs.  RUQ abd discomfort not bothering her right now, but worst when standing or sitting upright.  Eating ok.      Objective:   Physical Exam:  General: Lying comfortably in bed, no acute distress.   Eyes: No conjunctival injection or icterus.  Pupils equal.   ENT: Mucous membranes moist.  Normal appearing ears, nose.   Neck: Midline trachea.  No obvious goiter.   Respiratory: Normal respiratory effort.  Clear to auscultation bilaterally without wheezes or crackles.    Cardiovascular: Regular rhythm w/o murmur.  Radial pulses 2+ bilaterally.  No significant lower extremity edema.   Gastrointestinal: Bowel sounds present, soft, nontender, distended w dullness to percussion.   Skin: No obvious rashes or breakdown.  Warm, dry.   Musculoskeletal: Motor strength in all extremities grossly intact.   Psychiatric: Alert.  Answers questions appropriately.  Judgment and insight intact.   Neurologic: Extraocular movements intact, no facial asymmetry.  No gross sensory deficits.       Vital signs in last 24 hours:  Temp:  [35.9 ??C (96.6 ??F)-37.1 ??C (98.8 ??F)] 36.7 ??C (98.1 ??F)  Heart Rate:  [74-76] 76  Resp:  [18-20] 18  BP: (107-119)/(52-65) 116/56  MAP (mmHg):  [77-84] 81  SpO2:  [94 %-99 %] 99 %    Intake/Output last 24 hours:    Intake/Output Summary (Last 24 hours) at 09/25/2022 0981  Last data filed at 09/24/2022 1602  Gross per 24 hour   Intake --   Output 1800 ml   Net -1800 ml       Most recent recorded weights:  Last 5 Recorded Weights    09/21/22 2254 09/22/22 1832 09/23/22 0634   Weight: (!) 124.1 kg (273 lb 9.6 oz) (!) 118.1 kg (260 lb 5 oz) (!) 122.6 kg (270 lb 4.8 oz)       Medications:   Scheduled Meds:   buPROPion  150 mg Oral Daily    carvediloL  6.25 mg Oral BID    citalopram  40 mg Oral Daily    enoxaparin (LOVENOX) injection  40 mg Subcutaneous Q12H    ferrous sulfate  325 mg Oral Every other day    furosemide  40 mg Intravenous Daily    insulin glargine  66 Units Subcutaneous Nightly    insulin lispro  0-20 Units Subcutaneous ACHS    lactulose  20 g Oral TID    pantoprazole  40 mg Oral BID    pravastatin  40 mg Oral Nightly    spironolactone  100 mg Oral Daily           ADMINISTRATIVE  50 minutes total spent on this encounter, including preparing to see the patient, seeing and examining the patient, counseling the patient, placing orders, discussing the patient with other providers, documenting the encounter, and coordinating care.

## 2022-09-26 LAB — URINALYSIS WITH MICROSCOPY
BILIRUBIN UA: NEGATIVE
BLOOD UA: NEGATIVE
GLUCOSE UA: 150 — AB
KETONES UA: NEGATIVE
LEUKOCYTE ESTERASE UA: NEGATIVE
NITRITE UA: NEGATIVE
PH UA: 5.5 (ref 5.0–9.0)
PROTEIN UA: NEGATIVE
RBC UA: 1 /HPF (ref <=4)
SPECIFIC GRAVITY UA: 1.007 (ref 1.003–1.030)
SQUAMOUS EPITHELIAL: 2 /HPF (ref 0–5)
UROBILINOGEN UA: <2
WBC UA: <1 /HPF (ref 0–5)

## 2022-09-26 LAB — HEPATIC FUNCTION PANEL
ALBUMIN: 2.3 g/dL — ABNORMAL LOW (ref 3.4–5.0)
ALKALINE PHOSPHATASE: 157 U/L — ABNORMAL HIGH (ref 46–116)
ALT (SGPT): 17 U/L (ref 10–49)
AST (SGOT): 53 U/L — ABNORMAL HIGH (ref <=34)
BILIRUBIN DIRECT: 0.8 mg/dL — ABNORMAL HIGH (ref 0.00–0.30)
BILIRUBIN TOTAL: 1.5 mg/dL — ABNORMAL HIGH (ref 0.3–1.2)
PROTEIN TOTAL: 6.3 g/dL (ref 5.7–8.2)

## 2022-09-26 LAB — PROTIME-INR
INR: 1.3
PROTIME: 14.3 s — ABNORMAL HIGH (ref 9.9–12.6)

## 2022-09-26 LAB — BASIC METABOLIC PANEL
ANION GAP: 5 mmol/L (ref 5–14)
BLOOD UREA NITROGEN: <5 mg/dL — ABNORMAL LOW (ref 9–23)
CALCIUM: 8.8 mg/dL (ref 8.7–10.4)
CHLORIDE: 107 mmol/L (ref 98–107)
CO2: 28.6 mmol/L (ref 20.0–31.0)
CREATININE: 0.54 mg/dL — ABNORMAL LOW
EGFR CKD-EPI (2021) FEMALE: >90 mL/min/{1.73_m2} (ref >=60)
GLUCOSE RANDOM: 95 mg/dL (ref 70–179)
POTASSIUM: 3.3 mmol/L — ABNORMAL LOW (ref 3.4–4.8)
SODIUM: 141 mmol/L (ref 135–145)

## 2022-09-26 MED ADMIN — pravastatin (PRAVACHOL) tablet 40 mg: 40 mg | ORAL | @ 01:00:00

## 2022-09-26 MED ADMIN — insulin lispro (HumaLOG) injection 0-20 Units: 0-20 [IU] | SUBCUTANEOUS | @ 21:00:00

## 2022-09-26 MED ADMIN — pantoprazole (PROTONIX) EC tablet 40 mg: 40 mg | ORAL | @ 13:00:00

## 2022-09-26 MED ADMIN — insulin lispro (HumaLOG) injection 0-20 Units: 0-20 [IU] | SUBCUTANEOUS | @ 02:00:00

## 2022-09-26 MED ADMIN — pantoprazole (PROTONIX) EC tablet 40 mg: 40 mg | ORAL | @ 01:00:00

## 2022-09-26 MED ADMIN — carvediloL (COREG) tablet 6.25 mg: 6.25 mg | ORAL | @ 01:00:00

## 2022-09-26 MED ADMIN — enoxaparin (LOVENOX) syringe 40 mg: 40 mg | SUBCUTANEOUS | @ 01:00:00

## 2022-09-26 MED ADMIN — furosemide (LASIX) injection 80 mg: 80 mg | INTRAVENOUS | @ 20:00:00 | Stop: 2022-09-26

## 2022-09-26 MED ADMIN — zolpidem (AMBIEN) tablet 5 mg: 5 mg | ORAL | @ 02:00:00

## 2022-09-26 MED ADMIN — buPROPion (WELLBUTRIN XL) 24 hr tablet 150 mg: 150 mg | ORAL | @ 13:00:00

## 2022-09-26 MED ADMIN — insulin glargine (LANTUS) injection 60 Units: 60 [IU] | SUBCUTANEOUS | @ 01:00:00

## 2022-09-26 MED ADMIN — oxyCODONE (ROXICODONE) immediate release tablet 5 mg: 5 mg | ORAL | @ 18:00:00 | Stop: 2022-10-06

## 2022-09-26 MED ADMIN — lactulose oral solution: 20 g | ORAL | @ 01:00:00

## 2022-09-26 MED ADMIN — citalopram (CeleXA) tablet 40 mg: 40 mg | ORAL | @ 13:00:00

## 2022-09-26 MED ADMIN — lactulose oral solution: 20 g | ORAL | @ 18:00:00

## 2022-09-26 MED ADMIN — spironolactone (ALDACTONE) tablet 100 mg: 100 mg | ORAL | @ 13:00:00

## 2022-09-26 MED ADMIN — lactulose oral solution: 20 g | ORAL | @ 15:00:00

## 2022-09-26 MED ADMIN — carvediloL (COREG) tablet 6.25 mg: 6.25 mg | ORAL | @ 20:00:00

## 2022-09-26 MED ADMIN — enoxaparin (LOVENOX) syringe 40 mg: 40 mg | SUBCUTANEOUS | @ 13:00:00

## 2022-09-26 NOTE — Unmapped (Signed)
Pt alert and oriented. On room air. Oxycodone given for pain, Zofran given for nausea. No acute events this shift.   Problem: Adult Inpatient Plan of Care  Goal: Plan of Care Review  Outcome: Progressing  Goal: Patient-Specific Goal (Individualized)  Outcome: Progressing  Goal: Absence of Hospital-Acquired Illness or Injury  Outcome: Progressing  Intervention: Identify and Manage Fall Risk  Recent Flowsheet Documentation  Taken 09/25/2022 1200 by Elroy Channel, RN  Safety Interventions:   fall reduction program maintained   low bed  Intervention: Prevent and Manage VTE (Venous Thromboembolism) Risk  Recent Flowsheet Documentation  Taken 09/25/2022 1200 by Elroy Channel, RN  Activity Management: up ad lib  Goal: Optimal Comfort and Wellbeing  Outcome: Progressing  Goal: Readiness for Transition of Care  Outcome: Progressing  Goal: Rounds/Family Conference  Outcome: Progressing     Problem: Fall Injury Risk  Goal: Absence of Fall and Fall-Related Injury  Outcome: Progressing  Intervention: Promote Injury-Free Environment  Recent Flowsheet Documentation  Taken 09/25/2022 1200 by Elroy Channel, RN  Safety Interventions:   fall reduction program maintained   low bed     Problem: Diabetes Comorbidity  Goal: Blood Glucose Level Within Targeted Range  Outcome: Progressing     Problem: Hypertension Comorbidity  Goal: Blood Pressure in Desired Range  Outcome: Progressing     Problem: Fluid Volume Excess  Goal: Fluid Balance  Outcome: Progressing     Problem: Gastrointestinal Complications (Liver Failure)  Goal: Optimal Gastrointestinal Function  Outcome: Progressing     Problem: Pain (Liver Failure)  Goal: Optimal Pain Control  Outcome: Progressing

## 2022-09-26 NOTE — Unmapped (Addendum)
Pt is alert and oriented x 4. On room air. Blood pressure is 111/60. Carvedilol held. Pt is complaining of pain in RLQ. PRN pain med given. Pt verbalized plan of care. Intake and output documented. Working towards prioritized goals. 12 hour shift check list completed.    Problem: Adult Inpatient Plan of Care  Goal: Plan of Care Review  Outcome: Progressing     Problem: Adult Inpatient Plan of Care  Goal: Patient-Specific Goal (Individualized)  Outcome: Progressing     Problem: Adult Inpatient Plan of Care  Goal: Absence of Hospital-Acquired Illness or Injury  Intervention: Identify and Manage Fall Risk  Recent Flowsheet Documentation  Taken 09/26/2022 1200 by Lyndal Pulley, RN  Safety Interventions:   fall reduction program maintained   low bed   nonskid shoes/slippers when out of bed  Taken 09/26/2022 1000 by Lyndal Pulley, RN  Safety Interventions:   fall reduction program maintained   low bed   nonskid shoes/slippers when out of bed  Taken 09/26/2022 0800 by Lyndal Pulley, RN  Safety Interventions:   fall reduction program maintained   low bed   nonskid shoes/slippers when out of bed

## 2022-09-26 NOTE — Unmapped (Signed)
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 *         *RUQ abd pain.  Likely 2/2 ascites and improving.  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  Liver US w Doppler w no evidence of thrombus; moderate ascites seen.  No evidence for SBP on diagnostic paracentesis.  Abd CT w cirrhosis, ascites, no other pathology.  Gallbladder previously removed.  TTE w preserved EF.  Exam today w softer abd, and POCUS by me today showed decreasing ascites w smaller safe windows for therapeutic paracentesis.  -Continue furosemide IV 40mg  BID though due to my delay in ordering today, will give 80mg  IV x1 today (currently 1513) and plan on 40mg  IV BID starting tomorrow.  -Continue spironolactone 100mg  qday.  -Will consider LVP if daily POCUS does not show improvement (fast enough).  -Continue carvedilol.  -Continue lactulose.  -For now will allow oxycodone PRN.  -Watch Mg.  -Daily wt, strict I/O.  -Appreciate GI???s assistance.     *H/O gastritis, duodenitis.  -Continue PPI.  -Avoid NSAIDs.     *DM2, uncontrolled w A1c 9.8%.  DM gastroparesis.  -Continue glargine 60 units at bedtime.  -Hold nonformulary home canagliflozin.  -Holding home ASA.     *Psoriasis.  -Can resume adulimumab at home after DC.     *Fe deficiency anemia.  -Continue Fe.     *Depression, anxiety.  -Continue bupropion, citalopram.     *DVT prophylaxis.  -Enoxaparin.     *Dispo.  Home pending above.        Please page the [facility]) pager at [phone] with questions.        Consultants:   1.  GI           Pending labs:   Pending Labs       Order Current Status    Ascitic/Peritoneal Fluid Culture Preliminary result              Subjective:   Ondansetron x 1, oxycodone x 1, zolpidem x 1 in last 24 hrs.  Stomach feels softer to her.  RUQ pain not as bad as before.        Objective:   Physical Exam:  General: Sitting comfortably in chair, no acute distress.   Eyes: No conjunctival injection or icterus.  Pupils equal.   ENT: Mucous membranes moist.  Normal appearing ears, nose.   Neck: Midline trachea.  No obvious goiter.   Respiratory: Normal respiratory effort.  Clear to auscultation bilaterally without wheezes or crackles.    Cardiovascular: Regular rhythm w/o murmur.  Radial pulses 2+ bilaterally.  1+ B lower extremity edema.   Gastrointestinal: Bowel sounds present, soft, nontender, distended but softer than yesterday w more hypertympany.   Skin: Warm, dry.  Psoriasis rashes.   Musculoskeletal: Motor strength in all  extremities grossly intact.   Psychiatric: Alert.  Answers questions appropriately.  Judgment and insight intact.   Neurologic: Extraocular movements intact, no facial asymmetry.  No gross sensory deficits.       Vital signs in last 24 hours:  Temp:  [35.9 ??C (96.6 ??F)-36.9 ??C (98.4 ??F)] 36.9 ??C (98.4 ??F)  Heart Rate:  [64-80] 80  Resp:  [16-18] 18  BP: (113-129)/(54-69) 119/55  MAP (mmHg):  [79-85] 79  SpO2:  [95 %-99 %] 96 %    Intake/Output last 24 hours:    Intake/Output Summary (Last 24 hours) at 09/26/2022 0646  Last data filed at 09/25/2022 1400  Gross per 24 hour   Intake --   Output 950 ml   Net -950 ml       Most recent recorded weights:  Last 5 Recorded Weights    09/21/22 2254 09/22/22 1832 09/23/22 0634 09/26/22 0534   Weight: (!) 124.1 kg (273 lb 9.6 oz) (!) 118.1 kg (260 lb 5 oz) (!) 122.6 kg (270 lb 4.8 oz) (!) 118.3 kg (260 lb 12.8 oz) Medications:   Scheduled Meds:   buPROPion  150 mg Oral Daily    carvediloL  6.25 mg Oral BID    citalopram  40 mg Oral Daily    enoxaparin (LOVENOX) injection  40 mg Subcutaneous Q12H    ferrous sulfate  325 mg Oral Every other day    insulin glargine  60 Units Subcutaneous Nightly    insulin lispro  0-20 Units Subcutaneous ACHS    lactulose  20 g Oral TID    pantoprazole  40 mg Oral BID    pravastatin  40 mg Oral Nightly    spironolactone  100 mg Oral Daily         ADMINISTRATIVE  50  minutes total spent on this encounter, including preparing to see the patient, seeing and examining the patient, counseling the patient, placing orders, discussing the patient with other providers, documenting the encounter, and coordinating care.

## 2022-09-27 LAB — PROTIME-INR
INR: 1.32
PROTIME: 14.5 s — ABNORMAL HIGH (ref 9.9–12.6)

## 2022-09-27 LAB — BASIC METABOLIC PANEL
ANION GAP: 8 mmol/L (ref 5–14)
BLOOD UREA NITROGEN: 6 mg/dL — ABNORMAL LOW (ref 9–23)
BUN / CREAT RATIO: 11
CALCIUM: 8.6 mg/dL — ABNORMAL LOW (ref 8.7–10.4)
CHLORIDE: 105 mmol/L (ref 98–107)
CO2: 27.9 mmol/L (ref 20.0–31.0)
CREATININE: 0.53 mg/dL — ABNORMAL LOW
EGFR CKD-EPI (2021) FEMALE: >90 mL/min/{1.73_m2} (ref >=60)
GLUCOSE RANDOM: 119 mg/dL (ref 70–179)
POTASSIUM: 3.3 mmol/L — ABNORMAL LOW (ref 3.4–4.8)
SODIUM: 141 mmol/L (ref 135–145)

## 2022-09-27 LAB — MAGNESIUM: MAGNESIUM: 1.3 mg/dL — ABNORMAL LOW (ref 1.6–2.6)

## 2022-09-27 MED ADMIN — insulin lispro (HumaLOG) injection 0-20 Units: 0-20 [IU] | SUBCUTANEOUS | @ 21:00:00

## 2022-09-27 MED ADMIN — pantoprazole (PROTONIX) EC tablet 40 mg: 40 mg | ORAL

## 2022-09-27 MED ADMIN — enoxaparin (LOVENOX) syringe 40 mg: 40 mg | SUBCUTANEOUS

## 2022-09-27 MED ADMIN — oxyCODONE (ROXICODONE) immediate release tablet 5 mg: 5 mg | ORAL | @ 21:00:00 | Stop: 2022-10-06

## 2022-09-27 MED ADMIN — pantoprazole (PROTONIX) EC tablet 40 mg: 40 mg | ORAL | @ 15:00:00

## 2022-09-27 MED ADMIN — magnesium oxide (MAG-OX) tablet 400 mg: 400 mg | ORAL | @ 17:00:00

## 2022-09-27 MED ADMIN — oxyCODONE (ROXICODONE) immediate release tablet 5 mg: 5 mg | ORAL | Stop: 2022-10-06

## 2022-09-27 MED ADMIN — carvediloL (COREG) tablet 6.25 mg: 6.25 mg | ORAL

## 2022-09-27 MED ADMIN — insulin glargine (LANTUS) injection 60 Units: 60 [IU] | SUBCUTANEOUS

## 2022-09-27 MED ADMIN — spironolactone (ALDACTONE) tablet 100 mg: 100 mg | ORAL | @ 15:00:00

## 2022-09-27 MED ADMIN — oxyCODONE (ROXICODONE) immediate release tablet 5 mg: 5 mg | ORAL | @ 08:00:00 | Stop: 2022-10-06

## 2022-09-27 MED ADMIN — zolpidem (AMBIEN) tablet 5 mg: 5 mg | ORAL

## 2022-09-27 MED ADMIN — insulin lispro (HumaLOG) injection 0-20 Units: 0-20 [IU] | SUBCUTANEOUS | @ 02:00:00

## 2022-09-27 MED ADMIN — ferrous sulfate tablet 325 mg: 325 mg | ORAL | @ 15:00:00

## 2022-09-27 MED ADMIN — buPROPion (WELLBUTRIN XL) 24 hr tablet 150 mg: 150 mg | ORAL | @ 15:00:00

## 2022-09-27 MED ADMIN — pravastatin (PRAVACHOL) tablet 40 mg: 40 mg | ORAL

## 2022-09-27 MED ADMIN — citalopram (CeleXA) tablet 40 mg: 40 mg | ORAL | @ 15:00:00

## 2022-09-27 MED ADMIN — lactulose oral solution: 20 g | ORAL | @ 15:00:00

## 2022-09-27 MED ADMIN — furosemide (LASIX) injection 40 mg: 40 mg | INTRAVENOUS | @ 15:00:00 | Stop: 2022-09-27

## 2022-09-27 MED ADMIN — insulin lispro (HumaLOG) injection 0-20 Units: 0-20 [IU] | SUBCUTANEOUS | @ 17:00:00

## 2022-09-27 MED ADMIN — enoxaparin (LOVENOX) syringe 40 mg: 40 mg | SUBCUTANEOUS | @ 15:00:00 | Stop: 2022-09-27

## 2022-09-27 MED ADMIN — lactulose oral solution: 20 g | ORAL | @ 17:00:00

## 2022-09-27 NOTE — Unmapped (Signed)
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 *         *RUQ abd pain.  Likely 2/2 ascites and improving.  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  Liver US w Doppler w no evidence of thrombus; moderate ascites seen.  No evidence for SBP on diagnostic paracentesis.  Abd CT w cirrhosis, ascites, no other pathology.  Gallbladder previously removed.  TTE w preserved EF.  Despite evidence of decreasing ascites, decided to perform LVP today (see separate procedure note).  -Change from IV furosemide to furosemide 40mg  PO qday.  -Continue spironolactone 100mg  qday.  -Continue carvedilol.  -Continue lactulose.  -For now will allow oxycodone PRN.  -Daily wt, strict I/O.    *HypoMg.  Likely 2/2 diuresis.  -Start PO Mg.     *H/O gastritis, duodenitis.  -Continue PPI.  -Avoid NSAIDs.     *DM2, uncontrolled w A1c 9.8%.  DM gastroparesis.  -Continue glargine 60 units at bedtime.  -Hold nonformulary home canagliflozin.  -Holding home ASA.     *Psoriasis.  -Can resume adulimumab at home after DC.     *Fe deficiency anemia.  -Continue Fe.     *Depression, anxiety.  -Continue bupropion, citalopram.     *DVT prophylaxis.  -DC enoxaparin as ambulatory.     *Dispo.  Home pending above.        Please page the [facility]) pager at [phone] with questions.        Consultants:   1.  GI (off)         Subjective:   Oxycodone x 3, zolpidem x 1 in last 24 hrs.  Slept well.  RUQ abd pain continues at times but not as frequently as before.        Objective:   Physical Exam:  General: Lying comfortably in bed, no acute distress.   Eyes: No conjunctival injection or icterus.     ENT: Mucous membranes moist.  Normal appearing ears, nose.   Neck: Midline trachea.     Respiratory: Normal respiratory effort.     Cardiovascular: Unchanged 2+ B lower extremity edema.   Gastrointestinal: Bowel sounds present, soft, nontender, less distended following LVP.   Skin: No obvious rashes or breakdown.  Warm, dry.   Musculoskeletal: Motor strength in all extremities grossly intact.   Psychiatric: Alert.  Answers questions appropriately.  Judgment and insight intact.   Neurologic: Extraocular movements intact, no facial asymmetry.  No gross sensory deficits.       Vital signs in last 24 hours:  Temp:  [36.4 ??C (97.5 ??F)-36.6 ??C (97.8 ??F)] 36.6 ??C (97.8 ??F)  Heart Rate:  [78-82] 79  Resp:  [18] 18  BP: (91-137)/(49-66) 129/51  MAP (mmHg):  [66-95] 74  SpO2:  [95 %-99 %] 95 %    Intake/Output last  24 hours:    Intake/Output Summary (Last 24 hours) at 09/27/2022 0600  Last data filed at 09/26/2022 2300  Gross per 24 hour   Intake 827 ml   Output 650 ml   Net 177 ml       Most recent recorded weights:  Last 5 Recorded Weights    09/21/22 2254 09/22/22 1832 09/23/22 0634 09/26/22 0534   Weight: (!) 124.1 kg (273 lb 9.6 oz) (!) 118.1 kg (260 lb 5 oz) (!) 122.6 kg (270 lb 4.8 oz) (!) 118.3 kg (260 lb 12.8 oz)       Medications:   Scheduled Meds:   buPROPion  150 mg Oral Daily    carvediloL  6.25 mg Oral BID    citalopram  40 mg Oral Daily    enoxaparin (LOVENOX) injection  40 mg Subcutaneous Q12H    ferrous sulfate  325 mg Oral Every other day    furosemide  40 mg Intravenous BID    insulin glargine  60 Units Subcutaneous Nightly    insulin lispro  0-20 Units Subcutaneous ACHS    lactulose  20 g Oral TID    pantoprazole  40 mg Oral BID    pravastatin  40 mg Oral Nightly spironolactone  100 mg Oral Daily           ADMINISTRATIVE  50  minutes total spent on this encounter, including preparing to see the patient, seeing and examining the patient, counseling the patient, placing orders, discussing the patient with other providers, documenting the encounter, and coordinating care.  This time did not include time spent on procedure (LVP) today.

## 2022-09-27 NOTE — Unmapped (Addendum)
Procedure Note    Procedure: Therapeutic Paracentesis with Ultrasound Guidance    Patient Name: Ashley Briggs  Patient MRN: [id number]  Date Performed: 09/27/2022    Indications: Diuretic resistant ascites    Procedure Details:   Informed consent was obtained after explanation of the risks and benefits of the procedure.  A time-out identifying the correct procedure and location was performed immediately prior to the procedure.    The head of the bed was placed 30-45 degrees, and the meniscus of the ascites was evaluated by bedside ultrasound.  A large area of ascitic fluid was identified in the right lower quadrant.  The linear probe with color Doppler was then used to ensure that a vessel was not located along the proposed needle path.    The area was prepped with chlorhexidine, and draped in usual sterile fashion.  1% lidocaine was injected subcutaneously and then deep to the skin using the Z-technique until the parietal peritoneum was anesthetized.  A paracentesis needle with catheter was introduced into this site again using the Z-technique until ascitic fluid was encountered.  Ascitic fluid was removed until flow of fluid stopped.  The catheter was removed with minimal bleeding.  A sterile bandage was placed after holding pressure.     No albumin was administered after paracentesis.    Findings:  3300 mL of clear and yellow ascites fluid were obtained.    The ascites fluid was not sent for further studies.    Complications: None        Time Completed: 1100

## 2022-09-27 NOTE — Unmapped (Addendum)
Pt is alert and oriented x 4. On room air. Insulin given as per sliding scale. Pt has no complaints of pain. No bleeding and pain at paracentesis site. Verbalized plan of care. Working towards prioritized goals. 12 hour chart check completed.    Problem: Adult Inpatient Plan of Care  Goal: Plan of Care Review  Outcome: Progressing     Problem: Adult Inpatient Plan of Care  Goal: Patient-Specific Goal (Individualized)  Outcome: Progressing     Problem: Adult Inpatient Plan of Care  Goal: Absence of Hospital-Acquired Illness or Injury  Intervention: Identify and Manage Fall Risk  Recent Flowsheet Documentation  Taken 09/27/2022 1400 by Lyndal Pulley, RN  Safety Interventions:   fall reduction program maintained   low bed   nonskid shoes/slippers when out of bed  Taken 09/27/2022 1200 by Lyndal Pulley, RN  Safety Interventions:   fall reduction program maintained   low bed   nonskid shoes/slippers when out of bed

## 2022-09-27 NOTE — Unmapped (Addendum)
A&Ox4. VSS. Patient requested PRN pain and sleeping medication with good results. Patient c/o her IV coming out when moving around in bed. Rounding nurse will place new IV prior to shift change. Bed is in the lowest position and call bell is within reach. Plan of care is ongoing.     0500- IV placed (20g left forearm)      24hr chart check completed    Problem: Adult Inpatient Plan of Care  Goal: Plan of Care Review  Outcome: Progressing  Goal: Patient-Specific Goal (Individualized)  Outcome: Progressing  Goal: Absence of Hospital-Acquired Illness or Injury  Outcome: Progressing  Intervention: Identify and Manage Fall Risk  Recent Flowsheet Documentation  Taken 09/26/2022 2000 by Jaquelyn Bitter, RN  Safety Interventions:   fall reduction program maintained   low bed   room near unit station   nonskid shoes/slippers when out of bed  Goal: Optimal Comfort and Wellbeing  Outcome: Progressing  Goal: Readiness for Transition of Care  Outcome: Progressing  Goal: Rounds/Family Conference  Outcome: Progressing     Problem: Fall Injury Risk  Goal: Absence of Fall and Fall-Related Injury  Outcome: Progressing  Intervention: Promote Injury-Free Environment  Recent Flowsheet Documentation  Taken 09/26/2022 2000 by Jaquelyn Bitter, RN  Safety Interventions:   fall reduction program maintained   low bed   room near unit station   nonskid shoes/slippers when out of bed

## 2022-09-27 NOTE — Unmapped (Signed)
[facility] Gastroenterology Consult Service   Treatment Plan         Recommendations:   Arteria Glenita Lammie is a 56 y.o. female with PMH of CAD, DM c/b retinopathy and gastroparesis, HLD, HTN, plaque psoriasis, MASH cirrhosis (EV, HE, new onset ascites) who is presenting to [facility] with Right upper quadrant abdominal pain in the setting of abdominal distention and new onset ascites. Workup thus far has revealed unclear precipitant other than continued progression of her liver disease and worsening portal hypertension.     Recommendations:   - continue diuresis with lasix and aldactone   - please reach out to GI fellow on-call if plan for LVP (no pocket to target at this time)   - continue carvedilol for EV ppx   - in-basket sent to Dr. Eudelia Bunch to determine optimal timing of follow-up in hepatology clinic (her current appt is not until 12/18/22)     Thank you for involving [place] in the care of your patient. We will sign-off at this time, please re-contact if additional questions or a new need for consultation arises.     For questions, contact the on-call fellow for the [facility] Gastroenterology Consult Service at [phone] (available 8AM-5PM only, excluding weekends and holidays).

## 2022-09-28 ENCOUNTER — Other Ambulatory Visit: Payer: Self-pay | Admitting: Psychiatry

## 2022-09-28 LAB — BASIC METABOLIC PANEL
ANION GAP: 8 mmol/L (ref 5–14)
BLOOD UREA NITROGEN: 7 mg/dL — ABNORMAL LOW (ref 9–23)
BUN / CREAT RATIO: 13
CALCIUM: 8.7 mg/dL (ref 8.7–10.4)
CHLORIDE: 106 mmol/L (ref 98–107)
CO2: 26.9 mmol/L (ref 20.0–31.0)
CREATININE: 0.52 mg/dL — ABNORMAL LOW
EGFR CKD-EPI (2021) FEMALE: >90 mL/min/{1.73_m2} (ref >=60)
GLUCOSE RANDOM: 109 mg/dL (ref 70–179)
POTASSIUM: 3.4 mmol/L (ref 3.4–4.8)
SODIUM: 141 mmol/L (ref 135–145)

## 2022-09-28 LAB — PROTIME-INR
INR: 1.28
PROTIME: 14.1 s — ABNORMAL HIGH (ref 9.9–12.6)

## 2022-09-28 LAB — HEPATIC FUNCTION PANEL
ALBUMIN: 2.4 g/dL — ABNORMAL LOW (ref 3.4–5.0)
ALKALINE PHOSPHATASE: 157 U/L — ABNORMAL HIGH (ref 46–116)
ALT (SGPT): 19 U/L (ref 10–49)
AST (SGOT): 49 U/L — ABNORMAL HIGH (ref <=34)
BILIRUBIN DIRECT: 1 mg/dL — ABNORMAL HIGH (ref 0.00–0.30)
BILIRUBIN TOTAL: 1.8 mg/dL — ABNORMAL HIGH (ref 0.3–1.2)
PROTEIN TOTAL: 6.5 g/dL (ref 5.7–8.2)

## 2022-09-28 MED ADMIN — insulin glargine (LANTUS) injection 60 Units: 60 [IU] | SUBCUTANEOUS | @ 01:00:00

## 2022-09-28 MED ADMIN — insulin lispro (HumaLOG) injection 0-20 Units: 0-20 [IU] | SUBCUTANEOUS | @ 23:00:00

## 2022-09-28 MED ADMIN — insulin lispro (HumaLOG) injection 0-20 Units: 0-20 [IU] | SUBCUTANEOUS | @ 01:00:00

## 2022-09-28 MED ADMIN — pantoprazole (PROTONIX) EC tablet 40 mg: 40 mg | ORAL | @ 01:00:00

## 2022-09-28 MED ADMIN — carvediloL (COREG) tablet 6.25 mg: 6.25 mg | ORAL | @ 12:00:00

## 2022-09-28 MED ADMIN — citalopram (CeleXA) tablet 40 mg: 40 mg | ORAL | @ 12:00:00

## 2022-09-28 MED ADMIN — lactulose oral solution: 20 g | ORAL | @ 19:00:00

## 2022-09-28 MED ADMIN — carvediloL (COREG) tablet 6.25 mg: 6.25 mg | ORAL | @ 01:00:00

## 2022-09-28 MED ADMIN — magnesium oxide (MAG-OX) tablet 400 mg: 400 mg | ORAL | @ 01:00:00

## 2022-09-28 MED ADMIN — spironolactone (ALDACTONE) tablet 100 mg: 100 mg | ORAL | @ 12:00:00 | Stop: 2022-09-28

## 2022-09-28 MED ADMIN — lactulose oral solution: 20 g | ORAL | @ 14:00:00

## 2022-09-28 MED ADMIN — pantoprazole (PROTONIX) EC tablet 40 mg: 40 mg | ORAL | @ 12:00:00

## 2022-09-28 MED ADMIN — magnesium oxide (MAG-OX) tablet 400 mg: 400 mg | ORAL | @ 12:00:00

## 2022-09-28 MED ADMIN — lactulose oral solution: 20 g | ORAL | @ 01:00:00

## 2022-09-28 MED ADMIN — spironolactone (ALDACTONE) tablet 100 mg: 100 mg | ORAL | @ 17:00:00 | Stop: 2022-09-28

## 2022-09-28 MED ADMIN — zolpidem (AMBIEN) tablet 5 mg: 5 mg | ORAL | @ 01:00:00

## 2022-09-28 MED ADMIN — pravastatin (PRAVACHOL) tablet 40 mg: 40 mg | ORAL | @ 01:00:00

## 2022-09-28 MED ADMIN — oxyCODONE (ROXICODONE) immediate release tablet 5 mg: 5 mg | ORAL | @ 17:00:00 | Stop: 2022-10-06

## 2022-09-28 MED ADMIN — buPROPion (WELLBUTRIN XL) 24 hr tablet 150 mg: 150 mg | ORAL | @ 12:00:00

## 2022-09-28 MED ADMIN — furosemide (LASIX) tablet 40 mg: 40 mg | ORAL | @ 19:00:00

## 2022-09-28 MED ADMIN — insulin lispro (HumaLOG) injection 0-20 Units: 0-20 [IU] | SUBCUTANEOUS | @ 17:00:00

## 2022-09-28 NOTE — Unmapped (Signed)
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 *         *RUQ abd pain.  Likely 2/2 ascites and definitely improved.  However w definite re-accumulation ascites today on exam and POCUS despite LVP of 3.3L yesterday (POCUS afterwards showed only trace ascites) and despite receiving 40mg  IV furosemide and 100mg  spironolactone yesterday too.  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  Liver US w Doppler w no evidence of thrombus but w moderate ascites.  No evidence for SBP on diagnostic paracentesis.  Abd CT w cirrhosis, ascites, no other pathology.  Gallbladder previously removed.  TTE w preserved EF.    -Continue PO furosemide but increase to 40mg  BID.  -Continue spironolactone but increase to 200mg  qday.  -Continue carvedilol.  -Continue lactulose.  -For now will allow oxycodone PRN.  -Daily wt, strict I/O.  -If re-accumulates even more ascites, may warrant GI re-consult for diuretic-resistant ascites.     *HypoMg.  Likely 2/2 diuresis.  -Continue PO Mg.     *H/O gastritis, duodenitis.  -Continue PPI.  -Avoid NSAIDs.     *DM2, uncontrolled w A1c 9.8%.  DM gastroparesis.  -Continue glargine 60 units at bedtime.  -Hold nonformulary home canagliflozin.  -Holding home ASA.     *Psoriasis.  -Can resume adulimumab at home after DC.     *Fe deficiency anemia.  -Continue Fe.     *Depression, anxiety.  -Continue bupropion, citalopram.     *DVT prophylaxis.  -Ambulation.     *Dispo.  Home pending above.        Please page the [facility]) pager at [phone] with questions.        Consultants:   1.  GI (off)        Subjective:   Oxycodone x 2, zolpidem x 1 in last 24 hrs.  Original RUQ pain much improved.  Did have some pain in R side that felt like broken rib pain radiating towards stomach; pain started not too far from paracentesis needle insertion site.        Objective:   Physical Exam:  General: Lying comfortably in bed, no acute distress.   Eyes: No conjunctival injection or icterus.     ENT: Mucous membranes moist.  Normal appearing ears, nose.   Neck: Midline trachea.     Respiratory: Normal respiratory effort.  Clear to auscultation bilaterally without wheezes or crackles.    Cardiovascular: Regular rhythm w/o murmur.  Radial pulses 2+ bilaterally.  2+ B lower extremity edema.   Gastrointestinal: Bowel sounds present, soft, nontender, nondistended.     Skin: No obvious rashes or breakdown.  Warm, dry.  Paracentesis site w/o bleeding, bruising, or hematoma.   Musculoskeletal: Motor strength in all extremities grossly intact.   Psychiatric: Alert.  Answers questions appropriately.  Judgment and insight  intact.   Neurologic: Extraocular movements intact, no facial asymmetry.  No gross sensory deficits.       Vital signs in last 24 hours:  Temp:  [36.6 ??C (97.9 ??F)-36.7 ??C (98 ??F)] 36.7 ??C (98 ??F)  Heart Rate:  [72-79] 79  Resp:  [18] 18  BP: (116-128)/(55-56) 116/55  MAP (mmHg):  [79-81] 79  SpO2:  [96 %-99 %] 96 %    Intake/Output last 24 hours:    Intake/Output Summary (Last 24 hours) at 09/28/2022 1406  Last data filed at 09/28/2022 0800  Gross per 24 hour   Intake 1450 ml   Output 1300 ml   Net 150 ml       Most recent recorded weights:  Last 5 Recorded Weights    09/21/22 2254 09/22/22 1832 09/23/22 0634 09/26/22 0534   Weight: (!) 124.1 kg (273 lb 9.6 oz) (!) 118.1 kg (260 lb 5 oz) (!) 122.6 kg (270 lb 4.8 oz) (!) 118.3 kg (260 lb 12.8 oz)    09/28/22 0627   Weight: (!) 113.3 kg (249 lb 11.2 oz)       Medications:   Scheduled Meds:   buPROPion  150 mg Oral Daily    carvediloL  6.25 mg Oral BID    citalopram  40 mg Oral Daily    ferrous sulfate  325 mg Oral Every other day    furosemide  40 mg Oral BID    insulin glargine  60 Units Subcutaneous Nightly    insulin lispro  0-20 Units Subcutaneous ACHS    lactulose  20 g Oral TID    magnesium oxide  400 mg Oral BID    pantoprazole  40 mg Oral BID    pravastatin  40 mg Oral Nightly    [START ON 09/29/2022] spironolactone  200 mg Oral Daily           ADMINISTRATIVE  55  minutes total spent on this encounter, including preparing to see the patient, seeing and examining the patient, counseling the patient, placing orders, discussing the patient with other providers, documenting the encounter, and coordinating care.

## 2022-09-28 NOTE — Unmapped (Signed)
A & O x 3, VS WN, no bleeding or leakage on the operation site. Medication given as ordered. Pt independent for ADL and compliant with POC. No acute changes noted this shift. Will continue to monitor patient.  Problem: Adult Inpatient Plan of Care  Goal: Plan of Care Review  Outcome: Ongoing - Unchanged  Goal: Patient-Specific Goal (Individualized)  Outcome: Ongoing - Unchanged  Goal: Absence of Hospital-Acquired Illness or Injury  Outcome: Ongoing - Unchanged  Intervention: Identify and Manage Fall Risk  Recent Flowsheet Documentation  Taken 09/28/2022 0200 by Amil Amen, RN  Safety Interventions: fall reduction program maintained  Taken 09/28/2022 0000 by Amil Amen, RN  Safety Interventions: fall reduction program maintained  Taken 09/27/2022 2200 by Torryn Fiske, Roetta Sessions, RN  Safety Interventions: fall reduction program maintained  Taken 09/27/2022 2000 by Amil Amen, RN  Safety Interventions: fall reduction program maintained  Intervention: Prevent and Manage VTE (Venous Thromboembolism) Risk  Recent Flowsheet Documentation  Taken 09/27/2022 2000 by Amil Amen, RN  Activity Management: activity adjusted per tolerance  Goal: Optimal Comfort and Wellbeing  Outcome: Ongoing - Unchanged  Goal: Readiness for Transition of Care  Outcome: Ongoing - Unchanged  Goal: Rounds/Family Conference  Outcome: Ongoing - Unchanged     Problem: Fall Injury Risk  Goal: Absence of Fall and Fall-Related Injury  Outcome: Ongoing - Unchanged  Intervention: Promote Injury-Free Environment  Recent Flowsheet Documentation  Taken 09/28/2022 0200 by Amil Amen, RN  Safety Interventions: fall reduction program maintained  Taken 09/28/2022 0000 by Amil Amen, RN  Safety Interventions: fall reduction program maintained  Taken 09/27/2022 2200 by Josuha Fontanez, Roetta Sessions, RN  Safety Interventions: fall reduction program maintained  Taken 09/27/2022 2000 by Amil Amen, RN  Safety Interventions: fall reduction program maintained     Problem: Diabetes Comorbidity  Goal: Blood Glucose Level Within Targeted Range  Outcome: Ongoing - Unchanged     Problem: Hypertension Comorbidity  Goal: Blood Pressure in Desired Range  Outcome: Ongoing - Unchanged     Problem: Fluid Volume Excess  Goal: Fluid Balance  Outcome: Ongoing - Unchanged     Problem: Gastrointestinal Complications (Liver Failure)  Goal: Optimal Gastrointestinal Function  Outcome: Ongoing - Unchanged     Problem: Pain (Liver Failure)  Goal: Optimal Pain Control  Outcome: Ongoing - Unchanged

## 2022-09-29 LAB — COMPREHENSIVE METABOLIC PANEL
ALBUMIN: 2.3 g/dL — ABNORMAL LOW (ref 3.4–5.0)
ALKALINE PHOSPHATASE: 149 U/L — ABNORMAL HIGH (ref 46–116)
ALT (SGPT): 18 U/L (ref 10–49)
ANION GAP: 9 mmol/L (ref 5–14)
AST (SGOT): 48 U/L — ABNORMAL HIGH (ref <=34)
BILIRUBIN TOTAL: 1.6 mg/dL — ABNORMAL HIGH (ref 0.3–1.2)
BLOOD UREA NITROGEN: 6 mg/dL — ABNORMAL LOW (ref 9–23)
BUN / CREAT RATIO: 11
CALCIUM: 8.7 mg/dL (ref 8.7–10.4)
CHLORIDE: 105 mmol/L (ref 98–107)
CO2: 27.5 mmol/L (ref 20.0–31.0)
CREATININE: 0.54 mg/dL — ABNORMAL LOW
EGFR CKD-EPI (2021) FEMALE: >90 mL/min/{1.73_m2} (ref >=60)
GLUCOSE RANDOM: 78 mg/dL (ref 70–179)
POTASSIUM: 3.2 mmol/L — ABNORMAL LOW (ref 3.4–4.8)
PROTEIN TOTAL: 6.4 g/dL (ref 5.7–8.2)
SODIUM: 141 mmol/L (ref 135–145)

## 2022-09-29 LAB — CBC
HEMATOCRIT: 30.6 % — ABNORMAL LOW (ref 34.0–44.0)
HEMOGLOBIN: 9.7 g/dL — ABNORMAL LOW (ref 11.3–14.9)
MEAN CORPUSCULAR HEMOGLOBIN CONC: 31.7 g/dL — ABNORMAL LOW (ref 32.0–36.0)
MEAN CORPUSCULAR HEMOGLOBIN: 24.5 pg — ABNORMAL LOW (ref 25.9–32.4)
MEAN CORPUSCULAR VOLUME: 77.4 fL — ABNORMAL LOW (ref 77.6–95.7)
MEAN PLATELET VOLUME: 8.3 fL (ref 6.8–10.7)
PLATELET COUNT: 135 10*9/L — ABNORMAL LOW (ref 150–450)
RED BLOOD CELL COUNT: 3.96 10*12/L (ref 3.95–5.13)
RED CELL DISTRIBUTION WIDTH: 18.7 % — ABNORMAL HIGH (ref 12.2–15.2)
WBC ADJUSTED: 4 10*9/L (ref 3.6–11.2)

## 2022-09-29 LAB — PROTIME-INR
INR: 1.33
PROTIME: 14.6 s — ABNORMAL HIGH (ref 9.9–12.6)

## 2022-09-29 LAB — MAGNESIUM: MAGNESIUM: 1.4 mg/dL — ABNORMAL LOW (ref 1.6–2.6)

## 2022-09-29 MED ADMIN — furosemide (LASIX) tablet 40 mg: 40 mg | ORAL | @ 18:00:00

## 2022-09-29 MED ADMIN — insulin lispro (HumaLOG) injection 0-20 Units: 0-20 [IU] | SUBCUTANEOUS | @ 03:00:00

## 2022-09-29 MED ADMIN — pantoprazole (PROTONIX) EC tablet 40 mg: 40 mg | ORAL | @ 14:00:00

## 2022-09-29 MED ADMIN — insulin glargine (LANTUS) injection 60 Units: 60 [IU] | SUBCUTANEOUS | @ 03:00:00

## 2022-09-29 MED ADMIN — potassium chloride ER tablet 40 mEq: 40 meq | ORAL | @ 23:00:00 | Stop: 2022-09-29

## 2022-09-29 MED ADMIN — pravastatin (PRAVACHOL) tablet 40 mg: 40 mg | ORAL | @ 01:00:00

## 2022-09-29 MED ADMIN — lactulose oral solution: 20 g | ORAL | @ 14:00:00

## 2022-09-29 MED ADMIN — spironolactone (ALDACTONE) tablet 200 mg: 200 mg | ORAL | @ 14:00:00

## 2022-09-29 MED ADMIN — oxyCODONE (ROXICODONE) immediate release tablet 5 mg: 5 mg | ORAL | @ 01:00:00 | Stop: 2022-10-06

## 2022-09-29 MED ADMIN — zolpidem (AMBIEN) tablet 5 mg: 5 mg | ORAL | @ 01:00:00

## 2022-09-29 MED ADMIN — ferrous sulfate tablet 325 mg: 325 mg | ORAL | @ 14:00:00

## 2022-09-29 MED ADMIN — buPROPion (WELLBUTRIN XL) 24 hr tablet 150 mg: 150 mg | ORAL | @ 14:00:00

## 2022-09-29 MED ADMIN — magnesium oxide (MAG-OX) tablet 400 mg: 400 mg | ORAL | @ 01:00:00

## 2022-09-29 MED ADMIN — carvediloL (COREG) tablet 6.25 mg: 6.25 mg | ORAL | @ 01:00:00

## 2022-09-29 MED ADMIN — furosemide (LASIX) tablet 40 mg: 40 mg | ORAL | @ 09:00:00

## 2022-09-29 MED ADMIN — citalopram (CeleXA) tablet 40 mg: 40 mg | ORAL | @ 14:00:00

## 2022-09-29 MED ADMIN — lactulose oral solution: 20 g | ORAL | @ 18:00:00

## 2022-09-29 MED ADMIN — pantoprazole (PROTONIX) EC tablet 40 mg: 40 mg | ORAL | @ 01:00:00

## 2022-09-29 MED ADMIN — magnesium oxide (MAG-OX) tablet 400 mg: 400 mg | ORAL | @ 14:00:00

## 2022-09-29 NOTE — Unmapped (Signed)
Problem: Adult Inpatient Plan of Care  Goal: Plan of Care Review  Outcome: Progressing  Goal: Patient-Specific Goal (Individualized)  Outcome: Progressing  Goal: Absence of Hospital-Acquired Illness or Injury  Outcome: Progressing  Intervention: Prevent and Manage VTE (Venous Thromboembolism) Risk  Recent Flowsheet Documentation  Taken 09/29/2022 0945 by Richrd Sox, RN  Activity Management: ambulated to bathroom  Taken 09/29/2022 0800 by Richrd Sox, RN  Activity Management: ambulated to bathroom  Goal: Optimal Comfort and Wellbeing  Outcome: Progressing  Goal: Readiness for Transition of Care  Outcome: Progressing  Goal: Rounds/Family Conference  Outcome: Progressing     Problem: Fall Injury Risk  Goal: Absence of Fall and Fall-Related Injury  Outcome: Progressing     Problem: Diabetes Comorbidity  Goal: Blood Glucose Level Within Targeted Range  Outcome: Progressing     Problem: Hypertension Comorbidity  Goal: Blood Pressure in Desired Range  Outcome: Progressing     Problem: Fluid Volume Excess  Goal: Fluid Balance  Outcome: Progressing     Problem: Gastrointestinal Complications (Liver Failure)  Goal: Optimal Gastrointestinal Function  Outcome: Progressing     Problem: Pain (Liver Failure)  Goal: Optimal Pain Control  Outcome: Progressing   Pt ambulates well with steady gait. Voiding well w/o difficulty. Denies pain.

## 2022-09-29 NOTE — Unmapped (Signed)
VSS. Medicated for pain x 1.   Problem: Adult Inpatient Plan of Care  Goal: Plan of Care Review  Outcome: Progressing  Goal: Patient-Specific Goal (Individualized)  Outcome: Progressing  Goal: Absence of Hospital-Acquired Illness or Injury  Outcome: Progressing  Goal: Optimal Comfort and Wellbeing  Outcome: Progressing  Goal: Readiness for Transition of Care  Outcome: Progressing  Goal: Rounds/Family Conference  Outcome: Progressing     Problem: Fall Injury Risk  Goal: Absence of Fall and Fall-Related Injury  Outcome: Progressing     Problem: Diabetes Comorbidity  Goal: Blood Glucose Level Within Targeted Range  Outcome: Progressing     Problem: Hypertension Comorbidity  Goal: Blood Pressure in Desired Range  Outcome: Progressing     Problem: Fluid Volume Excess  Goal: Fluid Balance  Outcome: Progressing     Problem: Gastrointestinal Complications (Liver Failure)  Goal: Optimal Gastrointestinal Function  Outcome: Progressing     Problem: Pain (Liver Failure)  Goal: Optimal Pain Control  Outcome: Progressing

## 2022-09-29 NOTE — Unmapped (Signed)
Admitted for RUQ abdominal pain. A&Ox4. Medicated x1 for pain with effective results. Able to make needs known. Fall precautions are being maintained. PIV intact and patent. Abdomen distended due to ascites. Continent of bowel and bladder. Call bell within reach. Will continue to monitor.  Problem: Adult Inpatient Plan of Care  Goal: Plan of Care Review  Outcome: Ongoing - Unchanged     Problem: Fall Injury Risk  Goal: Absence of Fall and Fall-Related Injury  Outcome: Ongoing - Unchanged  Intervention: Promote Injury-Free Environment  Recent Flowsheet Documentation  Taken 09/28/2022 2200 by Emmaline Kluver, RN  Safety Interventions:   fall reduction program maintained   lighting adjusted for tasks/safety   low bed   nonskid shoes/slippers when out of bed  Taken 09/28/2022 2000 by Emmaline Kluver, RN  Safety Interventions:   fall reduction program maintained   lighting adjusted for tasks/safety   low bed   nonskid shoes/slippers when out of bed     Problem: Pain (Liver Failure)  Goal: Optimal Pain Control  Outcome: Ongoing - Unchanged

## 2022-09-30 LAB — COMPREHENSIVE METABOLIC PANEL
ALBUMIN: 2.3 g/dL — ABNORMAL LOW (ref 3.4–5.0)
ALKALINE PHOSPHATASE: 148 U/L — ABNORMAL HIGH (ref 46–116)
ALT (SGPT): 16 U/L (ref 10–49)
ANION GAP: 6 mmol/L (ref 5–14)
AST (SGOT): 50 U/L — ABNORMAL HIGH (ref <=34)
BILIRUBIN TOTAL: 1.5 mg/dL — ABNORMAL HIGH (ref 0.3–1.2)
BLOOD UREA NITROGEN: 8 mg/dL — ABNORMAL LOW (ref 9–23)
BUN / CREAT RATIO: 14
CALCIUM: 8.9 mg/dL (ref 8.7–10.4)
CHLORIDE: 108 mmol/L — ABNORMAL HIGH (ref 98–107)
CO2: 27.6 mmol/L (ref 20.0–31.0)
CREATININE: 0.56 mg/dL
EGFR CKD-EPI (2021) FEMALE: >90 mL/min/{1.73_m2} (ref >=60)
GLUCOSE RANDOM: 72 mg/dL (ref 70–179)
POTASSIUM: 3.5 mmol/L (ref 3.4–4.8)
PROTEIN TOTAL: 6.2 g/dL (ref 5.7–8.2)
SODIUM: 142 mmol/L (ref 135–145)

## 2022-09-30 LAB — PROTIME-INR
INR: 1.34
PROTIME: 14.8 s — ABNORMAL HIGH (ref 9.9–12.6)

## 2022-09-30 LAB — MAGNESIUM: MAGNESIUM: 1.6 mg/dL (ref 1.6–2.6)

## 2022-09-30 MED ORDER — SPIRONOLACTONE 100 MG TABLET
ORAL_TABLET | Freq: Every day | ORAL | 0 refills | 30 days | Status: CP
Start: 2022-09-30 — End: 2022-09-30

## 2022-09-30 MED ORDER — OXYCODONE 5 MG TABLET
ORAL_TABLET | ORAL | 0 refills | 5.00000 days | Status: CP | PRN
Start: 2022-09-30 — End: 2022-10-07
  Filled 2022-09-30: qty 28, 5d supply, fill #0

## 2022-09-30 MED ADMIN — pravastatin (PRAVACHOL) tablet 40 mg: 40 mg | ORAL

## 2022-09-30 MED ADMIN — lactulose oral solution: 20 g | ORAL | @ 13:00:00 | Stop: 2022-09-30

## 2022-09-30 MED ADMIN — pantoprazole (PROTONIX) EC tablet 40 mg: 40 mg | ORAL | @ 13:00:00 | Stop: 2022-09-30

## 2022-09-30 MED ADMIN — carvediloL (COREG) tablet 6.25 mg: 6.25 mg | ORAL

## 2022-09-30 MED ADMIN — furosemide (LASIX) tablet 40 mg: 40 mg | ORAL | @ 10:00:00 | Stop: 2022-09-30

## 2022-09-30 MED ADMIN — pantoprazole (PROTONIX) EC tablet 40 mg: 40 mg | ORAL

## 2022-09-30 MED ADMIN — magnesium oxide (MAG-OX) tablet 400 mg: 400 mg | ORAL | @ 13:00:00 | Stop: 2022-09-30

## 2022-09-30 MED ADMIN — carvediloL (COREG) tablet 6.25 mg: 6.25 mg | ORAL | @ 13:00:00 | Stop: 2022-09-30

## 2022-09-30 MED ADMIN — oxyCODONE (ROXICODONE) immediate release tablet 5 mg: 5 mg | ORAL | @ 13:00:00 | Stop: 2022-09-30

## 2022-09-30 MED ADMIN — buPROPion (WELLBUTRIN XL) 24 hr tablet 150 mg: 150 mg | ORAL | @ 13:00:00 | Stop: 2022-09-30

## 2022-09-30 MED ADMIN — insulin glargine (LANTUS) injection 60 Units: 60 [IU] | SUBCUTANEOUS | @ 01:00:00

## 2022-09-30 MED ADMIN — citalopram (CeleXA) tablet 40 mg: 40 mg | ORAL | @ 13:00:00 | Stop: 2022-09-30

## 2022-09-30 MED ADMIN — magnesium oxide (MAG-OX) tablet 400 mg: 400 mg | ORAL

## 2022-09-30 MED ADMIN — insulin lispro (HumaLOG) injection 0-20 Units: 0-20 [IU] | SUBCUTANEOUS

## 2022-09-30 MED ADMIN — spironolactone (ALDACTONE) tablet 200 mg: 200 mg | ORAL | @ 13:00:00 | Stop: 2022-09-30

## 2022-09-30 NOTE — Unmapped (Signed)
Discharge instructions reviewed with patient- patient verbalizes understanding. Medications to be delivered by outpatient pharmacy. PIV removed intact. Pt medicated with pain medication for chronic knee pain. Pt declined afternoon medications before discharge.       ** Please select the following box if patient was discharged to SNF /[facility] /Another medical facility []     Were physician written discharge instructions discussed with patient or family?   [x] Yes  [] No    Were START medications discussed?   [] No start medications  [x] Yes   [] No     Were CHANGE medications discussed?   [] No change medications  [x] Yes   [] No     Were STOP medications discussed?   [] No stop medications  [x] Yes   [] No     Were pertinent side effects for new medications from [employer] discussed?     []  No new medications  [x] Yes   [] No    Were follow-up appointments discussed?   [] No follow-up appointments needed [x] Yes   [] No     Was the AVS printed in color?   [x] Yes  [] No    8.  Was this discharge completed by Virtual Nurse?   []  Yes  [x]  No    9.  Why was Acupuncturist not used?   []  Patient did not qualify []  Weekend/After hours [x] Patient Refused         Problem: Adult Inpatient Plan of Care  Goal: Plan of Care Review  Outcome: Resolved  Goal: Patient-Specific Goal (Individualized)  Outcome: Resolved  Goal: Absence of Hospital-Acquired Illness or Injury  Outcome: Resolved  Intervention: Identify and Manage Fall Risk  Recent Flowsheet Documentation  Taken 09/30/2022 1000 by Aggie Moats, RN  Safety Interventions:   low bed   nonskid shoes/slippers when out of bed   fall reduction program maintained  Taken 09/30/2022 0800 by Aggie Moats, RN  Safety Interventions:   low bed   nonskid shoes/slippers when out of bed   fall reduction program maintained  Goal: Optimal Comfort and Wellbeing  Outcome: Resolved  Goal: Readiness for Transition of Care  Outcome: Resolved  Goal: Rounds/Family Conference  Outcome: Resolved     Problem: Fall Injury Risk  Goal: Absence of Fall and Fall-Related Injury  Outcome: Resolved  Intervention: Promote Injury-Free Environment  Recent Flowsheet Documentation  Taken 09/30/2022 1000 by Aggie Moats, RN  Safety Interventions:   low bed   nonskid shoes/slippers when out of bed   fall reduction program maintained  Taken 09/30/2022 0800 by Aggie Moats, RN  Safety Interventions:   low bed   nonskid shoes/slippers when out of bed   fall reduction program maintained     Problem: Diabetes Comorbidity  Goal: Blood Glucose Level Within Targeted Range  Outcome: Resolved     Problem: Hypertension Comorbidity  Goal: Blood Pressure in Desired Range  Outcome: Resolved     Problem: Fluid Volume Excess  Goal: Fluid Balance  Outcome: Resolved     Problem: Gastrointestinal Complications (Liver Failure)  Goal: Optimal Gastrointestinal Function  Outcome: Resolved     Problem: Pain (Liver Failure)  Goal: Optimal Pain Control  Outcome: Resolved

## 2022-09-30 NOTE — Unmapped (Signed)
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 *         *RUQ abd pain.  Likely 2/2 ascites and definitely improved but still present requiring oxycodone despite paracentesis on 10/28 of 3.3 L.  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  Liver US w Doppler w no evidence of thrombus but w moderate ascites.  No evidence for SBP on diagnostic paracentesis.  Abd CT w cirrhosis, ascites, no other pathology.  Gallbladder previously removed, she did have gallstone pancreatitis at about that time.  TTE w preserved EF.    -Continue PO furosemide with recent yesterday increase to 40mg  BID.  Replete slightly low potassium 3.2.  -Continue spironolactone with recent yesterday increase to 200mg  qday.  -Continue carvedilol.  -Continue lactulose.  -For now will allow oxycodone PRN.  -Daily wt, strict I/O, 1800 mill fluid restriction.  -If re-accumulates even more ascites, may warrant GI re-consult for diuretic-resistant ascites.  It is unclear if her right side/flank pain is due to ascites.  Follow-up with Hepatology.     *HypoMg.  Likely 2/2 diuresis with loop diuretic.  -Continue PO Mg.     *H/O gastritis, duodenitis.  -Continue PPI.  -Avoid NSAIDs.     *DM2, uncontrolled w A1c 9.8%.  DM gastroparesis.  -Continue glargine 60 units at bedtime.  -Hold nonformulary home canagliflozin.  -Holding home ASA.     *Psoriasis.  -Can resume adulimumab at home after DC.     *Fe deficiency anemia.  -Continue Fe.     *Depression, anxiety.  -Continue bupropion, citalopram.     *DVT prophylaxis.  -Ambulation.     *Dispo.    Plan for DC in a.m. with close follow-up with Hepatology.        Please page the [facility]) pager at [phone] with questions.        Consultants:   1.  GI (off)        Subjective:   Continues to require oxycodone for pain.  Mild nausea, no vomiting.  She is on lactulose at home, bowel habits are unchanged from baseline.      Objective:   Physical Exam:  General:  Sitting up in chair, pleasant, conversant   Eyes: No conjunctival injection or icterus.     ENT: Mucous membranes moist.     Respiratory: Normal respiratory effort.     Cardiovascular: Regular rhythm w/o murmur.   Gastrointestinal: Bowel sounds present and very active, soft, nontender, nondistended.     Skin: Extensive psoriasis over central body and extensor surfaces arm, dry.  .   Musculoskeletal: Motor strength in all extremities grossly intact.   Psychiatric: Alert.  Answers questions appropriately.     Neurologic:  Alert, mental status normal to conversation.       Vital signs in last 24 hours:  Temp:  [36.2 ??C (97.2 ??F)-37.1 ??C (98.7 ??F)] 36.2 ??C (97.2 ??F)  Heart Rate:  [59-77] 74  Resp:  [18] 18  BP: (102-137)/(48-88) 117/56  MAP (mmHg):  [69-80] 80  SpO2:  [93 %-99 %] 99 %    Intake/Output last 24 hours:    Intake/Output Summary (Last 24 hours) at 09/29/2022 1726  Last data filed at 09/29/2022 1415  Gross per 24 hour   Intake 360 ml   Output 2650 ml   Net -2290 ml       Most recent recorded weights:  Last 5 Recorded Weights    09/22/22 1832 09/23/22 0634 09/26/22 0534 09/28/22 0627   Weight: (!) 118.1 kg (260 lb 5 oz) (!) 122.6 kg (270 lb 4.8 oz) (!) 118.3 kg (260 lb 12.8 oz) (!) 113.3 kg (249 lb 11.2 oz)    09/29/22 0627   Weight: (!) 113.4 kg (250 lb)       Medications:   Scheduled Meds:  ??? buPROPion  150 mg Oral Daily   ??? carvediloL  6.25 mg Oral BID   ??? citalopram  40 mg Oral Daily   ??? ferrous sulfate  325 mg Oral Every other day   ??? furosemide  40 mg Oral BID   ??? insulin glargine  60 Units Subcutaneous Nightly   ??? insulin lispro  0-20 Units Subcutaneous ACHS   ??? lactulose  20 g Oral TID   ??? magnesium oxide  400 mg Oral BID   ??? pantoprazole  40 mg Oral BID   ??? potassium chloride  40 mEq Oral Once   ??? pravastatin  40 mg Oral Nightly   ??? spironolactone  200 mg Oral Daily           ADMINISTRATIVE  30 minutes total spent on this encounter, including preparing to see the patient, seeing and examining the patient, counseling the patient, placing orders, discussing the patient with other providers, documenting the encounter, and coordinating care.

## 2022-09-30 NOTE — Unmapped (Signed)
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  She did note that she has had abdominal pain going back as far as 2014 but has had a recent acute exacerbation.  She was subsequently admitted to the hospital for further evaluation and treatment.  In the emergency room, her vital signs were stable with normal oxygen saturation.    1.  Cirrhosis with ascites, abdominal pain.  Liver ultrasound with Doppler did not show portal vein thrombosis.  She subsequently underwent diagnostic paracentesis without evidence of spontaneous bacterial peritonitis.  Large-volume paracentesis was certainly considered but she was treated with intravenous furosemide and spironolactone in hopes of reducing the fluid burden without any further intervention.  She was seen in GI consultation for management of her ascites.  She does have a history of esophageal varices and is on beta-blockade with carvedilol 6.25 mg p.o. twice daily.  Echocardiogram was a technically difficult study but that showed left ventricular ejection fraction at over 55%, moderate left atrial enlargement and normal right ventricular size and function.  There was no evidence of heart failure as a contribution to her ascites.  CT scan of the abdomen and pelvis performed in the ER did not show any acute abnormalities.  The finding of hepatic cirrhosis was demonstrated and sequelae of portal hypertension was also seen.  No other significant incidental findings were reported.  Though her volume of ascites diminished as evidenced by her weight loss and her negative fluid balance, she continued to have enough ascites that large-volume paracentesis was performed on 10/28 yielding 3.3 L of ascites.  She was placed back on her home regimen of furosemide 40 mg daily and spironolactone 100 mg daily--though these were actually new prescriptions that she had not quite started prior to admission.  Despite this, she re-accumulated ascites rather quickly the following day so her furosemide and spironolactone were increased to 40 mg twice daily and 200 mg daily  respectively, to see if she could tolerate this and she was able to.  Her serum potassium level was 3.5 mmol/L on the day of discharge.  She is also on a loop diuretic but given the increased dose of Aldactone, she likely should experience a stable or gradually uptrending potassium.  She has normal renal function with a GFR of over 90 mls per minute and should be able to excrete potassium.   She has an appointment on 10/07/2022 with Hepatology.  She is followed with [facility] Hepatology clinic and as noted, cirrhosis thought to be secondary to [facility].  She has not had a biopsy.  Her most recent upper GI endoscopy did show grade 2 esophageal varices with moderate portal hypertensive gastropathy.  She did not have any hematemesis or coffee-ground emesis or other clinical concern for bleeding esophageal varices.  She is on aspirin 81 mg daily for her coronary artery disease and does use celecoxib 200 mg  twice daily, but this was not continued during her hospitalization, and she was told to stop this.  She is on a proton pump inhibitor with pantoprazole for GI prophylaxis.  At this time, her right upper quadrant pain appears to be secondary to chronic liver disease and alternative etiology has been established.  She is discharged home with Percocet and may require longer-term pain management unless improved medical management of her ascites decreases her pain.    2.  Type 2 Diabetes Mellitus complicated by retinopathy and gastroparesis:  Follows with [facility] Endocrinology. BG 90-131 since admission but with large glucosuria (on SGLT-2).  Her A1c increased back up to 9.8 earlier this month from 6.4 in February.  Her home regimen was continued and adjusted as needed based upon blood sugars.    3.  History of coronary artery disease.  She did not complain of any chest pain or anginal equivalents.  She did have mild elevation in high-sensitivity troponin to 39 dropping to 35, likely due to mild strain, the upper limits of normal troponin is 34 and this was thought to have no clinical significance.  Electrocardiogram did not show any acute ischemic changes.  See report and tracing for additional details.  Echocardiogram did not demonstrate any focal wall motion abnormalities.     Follow-up labs at next outpatient appointment: To include potassium and renal function.    Condition at Discharge: stable  Discharge Medications:      Your Medication List      STOP taking these medications    celecoxib 200 MG capsule  Commonly known as: CeleBREX        START taking these medications    oxyCODONE 5 MG immediate release tablet  Commonly known as: ROXICODONE  Take 1 tablet (5 mg total) by mouth every four (4) hours as needed for pain (Abdominal pain) for up to 7 days.        CHANGE how you take these medications    spironolactone 100 MG tablet  Commonly known as: ALDACTONE  Take 2 tablets (200 mg total) by mouth daily.  What changed: how much to take        CONTINUE taking these medications    ACCU-CHEK GUIDE GLUCOSE METER Misc  Generic drug: blood-glucose meter  Use as directed by provider.     acetaminophen 325 MG tablet  Commonly known as: TYLENOL  Take 2 tablets (650 mg total) by mouth two (2) times a day as needed for pain.     aspirin 81 MG tablet  Commonly known as: ECOTRIN  Take 1 tablet (81 mg total) by mouth daily.     buPROPion 150 MG 24 hr tablet  Commonly known as: WELLBUTRIN XL  Take 1 tablet (150 mg total) by mouth daily.     carvediloL 6.25 MG tablet  Commonly known as: COREG  Take 1 tablet (6.25 mg total) by mouth Two (2) times a day. Start with 1 tablet nightly, if tolerating well increase to twice daily     citalopram 40 MG tablet  Commonly known as: CeleXA  Take 1 tablet (40 mg total) by mouth daily.     clonazePAM 0.5 MG tablet  Commonly known as: KlonoPIN  Take 1 tablet (0.5 mg total) by mouth nightly as needed for anxiety and sleep. May repeat dose once.     cyclobenzaprine 5 MG tablet  Commonly known as: FLEXERIL  Take 1-2 tablets by mouth every 8 hours as needed muscle pain or spasm.  furosemide 40 MG tablet  Commonly known as: LASIX  Take 1 tablet (40 mg total) by mouth daily.     HUMIRA(CF) PEN 40 mg/0.4 mL injection  Generic drug: adalimumab  Inject the contents of 1 pen (40 mg total) under the skin every fourteen (14) days.     insulin glargine 100 unit/mL (3 mL) injection pen  Commonly known as: LANTUS SOLOSTAR U-100 INSULIN  Inject 0.66 mL (66 Units total) under the skin nightly.     INVOKANA 300 mg Tab tablet  Generic drug: canagliflozin  Take 1 tablet (300 mg total) by mouth every morning before breakfast.     lactulose 10 gram/15 mL solution  Take 30 mL (20 g total) by mouth Three (3) times a day.     lancets 30 gauge Misc  Use as directed to test once daily.     metFORMIN 500 MG 24 hr tablet  Commonly known as: GLUCOPHAGE-XR  Take 2 tablets (1,000 mg total) by mouth in the morning and 2 tablets (1,000 mg total) in the evening. Take with meals.     pantoprazole 40 MG tablet  Commonly known as: PROTONIX  Take 1 tablet (40 mg total) by mouth Two (2) times a day.     simvastatin 20 MG tablet  Commonly known as: ZOCOR  Take 1 tablet (20 mg total) by mouth every evening.     SLOW FE 142 mg (45 mg iron) Tber  Generic drug: ferrous sulfate  Take 1 tablet (142 mg) by mouth two (2) times a day.     TRULICITY 1.5 mg/0.5 mL Pnij  Generic drug: dulaglutide  Inject the contents of 2 syringes (3 mg total) under the skin every seven (7) days.     ULTICARE PEN NEEDLE 32 gauge x 1/4 (6 mm) Ndle  Generic drug: pen needle, diabetic  Use as directed to inject Lantus     zolpidem 5 MG tablet  Commonly known as: AMBIEN  Take 1 tablet (5 mg total) by mouth nightly as needed for sleep.               Discharge Instructions:   Activity Instructions       Activity as tolerated            Diet Instructions       Discharge diet (specify)      Discharge Nutrition Therapy:  Consistent Carb  Heart Healthy       Consistent Carb Level: Consistent Carb 60/60/60 (4/4/4)    2 Liter fluid limit per day              Other Instructions       Call MD for:      Shortness of breath    Call MD for:  persistent nausea or vomiting      Call MD for:  severe uncontrolled pain      Discharge instructions      Try to stay at least 6 hours apart on your pain medication doses.  The medication can be constipating but the lactulose you take should prevent that.    Discharge instructions to patient: Call your Specialist doctor and make an appointment to see them (specify):      Specialist: Hepatology  Dr. Eudelia Bunch will be notified of your discharge.  If you do not hear anything within 48 hours, please call his office.    Discharge instructions to patient: Call your primary care doctor and make an appointment to see them:  Within 1 week  from the time you are discharged from the hospital as you may need refills on your pain medication.        Follow Up instructions and Outpatient Referrals     Call MD for:      Call MD for:  persistent nausea or vomiting      Call MD for:  severe uncontrolled pain      Discharge instructions        Appointments which have been scheduled for you      Oct 07, 2022 11:00 AM  (Arrive by 10:30 AM)  RETURN  HEPATOLOGY with Kennedy Bucker, MD  [facility] LIVER TRANSPLANT [facility] REGION) [address]  [place] [address]  [phone]        Oct 16, 2022  2:00 PM  (Arrive by 1:45 PM)  RETURN CONTINUITY with Benison Pap [place], DO  [facility] PRIMARY CARE [address] AT [facility] REGION) [address]  [place] [address]  [phone]        Oct 21, 2022  8:00 AM  (Arrive by 7:30 AM)  NEW UROGYN with Kary Kos, MD  [facility] AND RECON PELVIC SURGERY [facility] [place] [place]) [address]  [place] [address]  [phone]        Oct 21, 2022  1:00 PM  (Arrive by 12:45 PM)  RETURN  GENERAL with Abbie Sons, MD  [facility] DERMATOLOGY AND SKIN CANCER CENTER [facility] REGION) [address] [place] [address]  [place] [address]  [phone]        Oct 30, 2022 11:00 AM  (Arrive by 10:30 AM)  RETURN  HEPATOLOGY with Suzie Portela, PA  [facility] LIVER TRANSPLANT [facility] REGION) [address]  [place] [address]  [phone]        Nov 11, 2022  1:00 PM  (Arrive by 12:45 PM)  RETURN CONTINUITY with Benison Pap Rosine Beat, DO  [facility] PRIMARY CARE [address] AT [facility] REGION) [address] [place] [address]  [phone]        Nov 17, 2022  8:20 AM  (Arrive by 8:05 AM)  ADD ON AUDIO with SPCAUD AUDIOLOGIST  [facility] AUDIOLOGY [address] [facility] REGION) [address]  [address]  [place] [address]  [phone]        Nov 17, 2022  9:00 AM  (Arrive by 8:45 AM)  NEW ENT with Elouise Munroe, MD PhD  [facility] OTOLARYNGOLOGY [address] [facility] REGION) [address]  [address]  [place] [address]  [phone]        Dec 02, 2022  2:20 PM  RETURN OPHTHALMOLOGY with Leota Jacobsen, MD  [facility] OPHTHALMOLOGY [address] [facility] REGION) [address]  [address]  [place] [address]  [phone]        Jan 15, 2023  3:30 PM  (Arrive by 3:00 PM)  RETURN  HEPATOLOGY with Kennedy Bucker, MD  [facility] LIVER TRANSPLANT  [facility] REGION) [address]  [place] [address]  [phone]                I spent greater than 30 minutes in the  discharge of this patient.    Tawni Levy MD

## 2022-09-30 NOTE — Unmapped (Signed)
Pt resting in bed, eyes closed. No signs of pain or distress at this time. Monitoring output. Will continue to monitor.   Problem: Adult Inpatient Plan of Care  Goal: Plan of Care Review  Outcome: Progressing  Goal: Patient-Specific Goal (Individualized)  Outcome: Progressing  Goal: Absence of Hospital-Acquired Illness or Injury  Outcome: Progressing  Intervention: Identify and Manage Fall Risk  Recent Flowsheet Documentation  Taken 09/29/2022 2000 by Anders Simmonds, RN  Safety Interventions:   low bed   lighting adjusted for tasks/safety  Intervention: Prevent and Manage VTE (Venous Thromboembolism) Risk  Recent Flowsheet Documentation  Taken 09/29/2022 2000 by Anders Simmonds, RN  Activity Management: up ad lib  Goal: Optimal Comfort and Wellbeing  Outcome: Progressing  Goal: Readiness for Transition of Care  Outcome: Progressing  Goal: Rounds/Family Conference  Outcome: Progressing     Problem: Fall Injury Risk  Goal: Absence of Fall and Fall-Related Injury  Outcome: Progressing  Intervention: Promote Scientist, clinical (histocompatibility and immunogenetics) Documentation  Taken 09/29/2022 2000 by Anders Simmonds, RN  Safety Interventions:   low bed   lighting adjusted for tasks/safety     Problem: Diabetes Comorbidity  Goal: Blood Glucose Level Within Targeted Range  Outcome: Progressing     Problem: Hypertension Comorbidity  Goal: Blood Pressure in Desired Range  Outcome: Progressing     Problem: Fluid Volume Excess  Goal: Fluid Balance  Outcome: Progressing     Problem: Gastrointestinal Complications (Liver Failure)  Goal: Optimal Gastrointestinal Function  Outcome: Progressing     Problem: Pain (Liver Failure)  Goal: Optimal Pain Control  Outcome: Progressing

## 2022-10-01 NOTE — Unmapped (Signed)
Patient voicemail is full.Ashley Briggs app letter sent for new date w/Dr. Eudelia Bunch is feb 15.. jan 18 was cancelled

## 2022-10-02 DIAGNOSIS — L409 Psoriasis, unspecified: Principal | ICD-10-CM

## 2022-10-02 MED ORDER — HUMIRA PEN CITRATE FREE 40 MG/0.4 ML
SUBCUTANEOUS | 0 refills | 28 days
Start: 2022-10-02 — End: ?

## 2022-10-03 MED ORDER — HUMIRA PEN CITRATE FREE 40 MG/0.4 ML
SUBCUTANEOUS | 0 refills | 28.00000 days | Status: CP
Start: 2022-10-03 — End: ?

## 2022-10-04 ENCOUNTER — Other Ambulatory Visit: Payer: Self-pay | Admitting: Psychiatry
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
## 2022-10-06 NOTE — Unmapped (Signed)
-----   Message from Abbie Sons, MD sent at 10/06/2022  9:31 AM EST -----  Regarding: Please call and offer appointment on Wednesday 11/8  Hi, right now I have openings on Wednesday in [place] at 300 and [phone]? Could you please call and offer one of these slots to this patient? Thanks!

## 2022-10-07 ENCOUNTER — Encounter: Payer: Self-pay | Admitting: Psychiatry

## 2022-10-07 ENCOUNTER — Ambulatory Visit (INDEPENDENT_AMBULATORY_CARE_PROVIDER_SITE_OTHER): Payer: Medicaid Other | Admitting: Psychiatry

## 2022-10-07 ENCOUNTER — Ambulatory Visit: Admit: 2022-10-07 | Discharge: 2022-10-08 | Payer: PRIVATE HEALTH INSURANCE

## 2022-10-07 VITALS — BP 126/57 | HR 80 | Temp 98.3°F | Ht 64.0 in | Wt 229.6 lb

## 2022-10-07 DIAGNOSIS — K7581 Nonalcoholic steatohepatitis (NASH): Principal | ICD-10-CM

## 2022-10-07 DIAGNOSIS — K7460 Unspecified cirrhosis of liver: Principal | ICD-10-CM

## 2022-10-07 DIAGNOSIS — R188 Other ascites: Principal | ICD-10-CM

## 2022-10-07 DIAGNOSIS — K7682 Hepatic encephalopathy (CMS-HCC): Principal | ICD-10-CM

## 2022-10-07 DIAGNOSIS — L299 Pruritus, unspecified: Principal | ICD-10-CM

## 2022-10-07 DIAGNOSIS — Z23 Encounter for immunization: Principal | ICD-10-CM

## 2022-10-07 DIAGNOSIS — R601 Generalized edema: Principal | ICD-10-CM

## 2022-10-07 DIAGNOSIS — F331 Major depressive disorder, recurrent, moderate: Secondary | ICD-10-CM

## 2022-10-07 DIAGNOSIS — G4700 Insomnia, unspecified: Secondary | ICD-10-CM | POA: Diagnosis not present

## 2022-10-07 DIAGNOSIS — F4010 Social phobia, unspecified: Secondary | ICD-10-CM | POA: Diagnosis not present

## 2022-10-07 LAB — CBC
HEMATOCRIT: 34.7 % (ref 34.0–44.0)
HEMOGLOBIN: 10.9 g/dL — ABNORMAL LOW (ref 11.3–14.9)
MEAN CORPUSCULAR HEMOGLOBIN CONC: 31.2 g/dL — ABNORMAL LOW (ref 32.0–36.0)
MEAN CORPUSCULAR HEMOGLOBIN: 24.2 pg — ABNORMAL LOW (ref 25.9–32.4)
MEAN CORPUSCULAR VOLUME: 77.4 fL — ABNORMAL LOW (ref 77.6–95.7)
MEAN PLATELET VOLUME: 8.7 fL (ref 6.8–10.7)
PLATELET COUNT: 223 10*9/L (ref 150–450)
RED BLOOD CELL COUNT: 4.49 10*12/L (ref 3.95–5.13)
RED CELL DISTRIBUTION WIDTH: 18.6 % — ABNORMAL HIGH (ref 12.2–15.2)
WBC ADJUSTED: 4.7 10*9/L (ref 3.6–11.2)

## 2022-10-07 LAB — COMPREHENSIVE METABOLIC PANEL
ALBUMIN: 3 g/dL — ABNORMAL LOW (ref 3.4–5.0)
ALKALINE PHOSPHATASE: 173 U/L — ABNORMAL HIGH (ref 46–116)
ALT (SGPT): 18 U/L (ref 10–49)
ANION GAP: 7 mmol/L (ref 5–14)
AST (SGOT): 46 U/L — ABNORMAL HIGH (ref <=34)
BILIRUBIN TOTAL: 1.5 mg/dL — ABNORMAL HIGH (ref 0.3–1.2)
BLOOD UREA NITROGEN: 11 mg/dL (ref 9–23)
BUN / CREAT RATIO: 16
CALCIUM: 9.7 mg/dL (ref 8.7–10.4)
CHLORIDE: 106 mmol/L (ref 98–107)
CO2: 26 mmol/L (ref 20.0–31.0)
CREATININE: 0.69 mg/dL
EGFR CKD-EPI (2021) FEMALE: >90 mL/min/{1.73_m2} (ref >=60)
GLUCOSE RANDOM: 94 mg/dL (ref 70–99)
POTASSIUM: 4.3 mmol/L (ref 3.4–4.8)
PROTEIN TOTAL: 8.4 g/dL — ABNORMAL HIGH (ref 5.7–8.2)
SODIUM: 139 mmol/L (ref 135–145)

## 2022-10-07 LAB — PROTIME-INR
INR: 1.2
PROTIME: 13.4 s — ABNORMAL HIGH (ref 9.9–12.6)

## 2022-10-07 MED ORDER — RIFAXIMIN 550 MG TABLET
ORAL_TABLET | Freq: Two times a day (BID) | ORAL | 11 refills | 30 days | Status: CP
Start: 2022-10-07 — End: 2023-10-02
  Filled 2022-10-15: qty 60, 30d supply, fill #0

## 2022-10-07 MED ORDER — FUROSEMIDE 40 MG TABLET
ORAL_TABLET | Freq: Every day | ORAL | 3 refills | 90 days | Status: CP
Start: 2022-10-07 — End: 2023-10-02
  Filled 2022-10-15: qty 90, 90d supply, fill #0

## 2022-10-07 MED ORDER — CHOLESTYRAMINE-ASPARTAME 4 GRAM ORAL POWDER FOR SUSP IN A PACKET
PACK | Freq: Every day | ORAL | 3 refills | 60 days | Status: CP
Start: 2022-10-07 — End: 2023-10-02
  Filled 2022-10-16: qty 60, 60d supply, fill #0

## 2022-10-07 MED ORDER — BUPROPION HCL ER (XL) 150 MG PO TB24: 150.0000 mg | ORAL_TABLET | Freq: Every day | ORAL | 2 refills | Status: DC

## 2022-10-07 MED ORDER — CITALOPRAM HYDROBROMIDE 40 MG PO TABS: 40.0000 mg | ORAL_TABLET | Freq: Every morning | ORAL | 2 refills | Status: DC

## 2022-10-07 MED ORDER — ZOLPIDEM TARTRATE 5 MG PO TABS: 5.0000 mg | ORAL_TABLET | Freq: Every evening | ORAL | 2 refills | Status: DC | PRN

## 2022-10-07 NOTE — Unmapped (Unsigned)
Per provider, the patient received Covid-19 Spikevax 50yrs+ vaccine in left deltoid  Patient ID verified with name and date of birth.  All screening questions were answered.  Vaccine(s) were administered as ordered.  See immunization history for documentation.  Patient tolerated the injection(s) well with no issues noted.  Vaccine Information sheet given to the patient.

## 2022-10-07 NOTE — Unmapped (Signed)
[facility]  10/07/2022    Reason for visit: Follow up for Liver cirrhosis secondary to NASH (CMS-HCC) [K75.81, K74.60]    Assessment/Plan:    56 y.o. 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     #Decompensated MASH cirrhosis: Previously decompensated by HE, however new onset ascites in October 2023, though her volume overload does appear to be diuretic responsive, with improvement in abdominal ascites and LE edema on diuretics and following a low Na diet. We discussed progression of disease today and liver transplantation. Given low MELD, right now LDLT would be only option. She is going to think about this and discuss with her cousin and best friend (who is interested in being a donor but does have some health concerns).  - Repeat MELD labs today  - Discuss protein intake, and evening high protein snack before bedtime  - Discuss modest weight loss, lifestyle changes    #New onset ascites: Ascites that began in 08/2022 without clear precipitant. Briefly hosptialized at [facility] with diagnostic paracentesis demonstrating low TP at less than 2.0 (no albumin obtained to calculate SAAG, but presume high), and negative for SBP. Has followed Low Na diet and lasix 40/spiro 100 without reaccumulation  - MELD labs as above   - Continue lasix 40/spironolactone 100  - Low Na diet (000mg )  - Discussed liberalization of fluid intake, okay to drink to thirst if following low Na diet    #HCC Screening: Due January 2024 (MRI 05/2022 without lesions).     #HE: Prior HE and has noticed episodes of disorientation when driving.   - Recommended that patient no longer drive now that she has developed HE. Discussed risks of continued driving with HE and patient agrees with this.  - Recommended minimizing ambien/oxycodone as much as possible as these can worsen/precipitate HE. Recommended she discuss other sleep aids with PCP (like trazodone)  - Recommended patient try OTC melatonin  - Rx for rifaximin 550mg  BID    #Nausea: Ongoing nausea. Suspect related to gastroparesis and poorly controlled DM (A1C 9.0), however trulicity may also be worsening delayed gastric emptying  - Recommend improved blood glucose control. If obtains this and still is having vomiting, would recommend discussing switching to an different medication w/PCP    #Pruritus:   - Cholestyramine prescribed nightly. Uptitrate as needed for full effect.     #Preventive Hepatology Care:  - Hepatitis A Vaccine:  Due for next twinrix does in March 2024  - Hepatitis B Vaccine:  Due for next twinrix does in March 2024  - Pneumovax: S/p PCV 05/2022  - COVID: s/p bivalent 05/2022, Give spike vax today  - S/p flu shot 08/2022  - Colonoscopy: Due in 2028 for next colonoscopy  - Pap Smear: s/p hysterectomy    Return in about 3 months (around 01/07/2023) for With Dr. Eudelia Bunch or Michel Santee.    Subjective   History of Present Illness   Accompanied by: cousin    56 y.o. 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     Interval History:  - Last seen by Dr. Eudelia Bunch July 2023.   - Admitted end of October with abdominal pain and new onset ascites. CTAP with moderate volume ascites, patent vasculature (had MRI in July with small volume ascites,  no lesions). Diagnostic paracentesis with TP < 2.0, no albumin sent, 200 nucleated cells, 35% PMNs, so negative for SBP. Discharged on lasix 40/spironolactone 100.     She said prior to her hospitalization she was noticing increasing abdominal girth. She doesn't notice any paritcular triggers. She did have a mechanical fall before this and fell on R side. She has been taking the lasix and spironolactone. She reports that she urinates about 3 times in the morning after she takes her lasix and spironolactone. She has been following a low Na diet.     She is working on getting enough protein.     She reports constant nausea. She has some dizziness, and has chronic RUQ pain. She has been on trulicity for a few years. She has been phenergen with her PCP. Sometimes it works, sometimes it doesn't.     Yesterday noticed an episode of confusion. She reports she will get disoriented while driving. Has trouble falling asleep and staying asleep. Her PCP put her on [place]. She is prescribed clonazepam too. She takes lactulose twice a day. She will miss a dose if she is out and about. She has three Bms per day when at home and takes her lactulose.     She has occasional dizzy spells, but says the carvedilol didn't worsen these episodes.     Objective   Physical Exam   Vital Signs: BP 118/53 (BP Site: L Arm, BP Position: Sitting, BP Cuff Size: Medium)  - Pulse 70  - Temp 36.1 ??C (97 ??F) (Tympanic)  - Ht 162.6 cm (5' 4)  - Wt (!) 103 kg (227 lb)  - LMP 08/03/2006  - SpO2 99%  - BMI 38.96 kg/m??   Constitutional: She is in no apparent distress  Eyes: Anicteric sclerae  Cardiovascular: No peripheral edema  Gastrointestinal: Soft, nontender abdomen without hepatosplenomegaly, hernias, or masses. No ascites  Neurologic: Awake, alert, and oriented to person, place, and time with normal speech and no asterixis  Skin: erythematous scales on abdomen, with pinpoint erythematous lesions (c/w known psoriasis)    Results for orders placed or performed in visit on 10/07/22   Comprehensive metabolic panel   Result Value Ref Range    Sodium 139 135 - 145 mmol/L    Potassium 4.3 3.4 - 4.8 mmol/L    Chloride 106 98 - 107 mmol/L    CO2 26.0 20.0 - 31.0 mmol/L    Anion Gap 7 5 - 14 mmol/L    BUN 11 9 - 23 mg/dL    Creatinine 1.61 0.96 - 1.02 mg/dL    BUN/Creatinine Ratio 16     eGFR CKD-EPI (2021) Female >90 >=60 mL/min/1.53m2    Glucose 94 70 - 99 mg/dL    Calcium 9.7 8.7 - 04.5 mg/dL    Albumin 3.0 (L) 3.4 - 5.0 g/dL    Total Protein 8.4 (H) 5.7 - 8.2 g/dL    Total Bilirubin 1.5 (H) 0.3 - 1.2 mg/dL    AST 46 (H) <=40 U/L    ALT 18 10 - 49 U/L    Alkaline Phosphatase 173 (H) 46 - 116 U/L   CBC   Result Value Ref Range    WBC 4.7 3.6 - 11.2 10*9/L    RBC 4.49 3.95 - 5.13 10*12/L    HGB 10.9 (L) 11.3 - 14.9 g/dL    HCT 98.1 19.1 - 47.8 %    MCV 77.4 (L) 77.6 - 95.7 fL    MCH 24.2 (L) 25.9 -  32.4 pg    MCHC 31.2 (L) 32.0 - 36.0 g/dL    RDW 09.8 (H) 11.9 - 15.2 %    MPV 8.7 6.8 - 10.7 fL    Platelet 223 150 - 450 10*9/L   PT-INR   Result Value Ref Range    PT 13.4 (H) 9.9 - 12.6 sec    INR 1.20

## 2022-10-07 NOTE — Unmapped (Addendum)
[facility]  [facility]  Stephannie Li, MD   [facility]  [address]  [place], [place] [address]  Main Clinic: [phone]  Appointment Schedulers: [phone]  Theophilus Kinds, RN: [phone]  Fax: [phone]       Thank you for allowing me to participate in your medical care today. Here are my recommendations based on today's visit:    **Get labs drawn today - in the lab in the ground floor of the hospital lobby**  Continue the furosemide and spironolactone as you are, and following a low salt diet (less than 2,000mg  a day).  You can have as much fluid intake as you are thirsty for. Focus on water, tea, coffee and avoid sodas and juices.  Minimize the oxycodone and ambien as much as possible  We will prescribe rifaximin 550mg  twice a day to help with your encephalopathy  You are due for your next ultrasound in January 2024. They should call you to schedule this.   For your nausea, talk to your primary doctor about getting your hemoglobin A1C under optimal control. If you still have nausea after that, you might discuss transitioning to to a different diabetes medication because Trulicity can worsen nausea.   We discussed today that we recommend not driving due to risks of accidents we see with hepatic encephalopathy.   For the itching: try cholestyramine nightly. Do not take it within 2 hours of any other medications.   Follow up in 3 months with Dr. Eudelia Bunch or Michel Santee    Take care,  Stephannie Li, MD

## 2022-10-07 NOTE — Unmapped (Signed)
Making a clinical call per Meghan.

## 2022-10-07 NOTE — Patient Instructions (Addendum)
Continue bupropion 150 mg daily Continue citalopram 40 mg daily Continue Ambien 5 mg at night as needed for insomnia Referral to therapy this is a follow-up appointment for Discontinue clonazepam Referred for evaluation of sleep apnea Next appointment: 1/8 at 4 PM

## 2022-10-08 ENCOUNTER — Ambulatory Visit: Admit: 2022-10-08 | Discharge: 2022-10-09 | Payer: PRIVATE HEALTH INSURANCE

## 2022-10-08 DIAGNOSIS — Z79899 Other long term (current) drug therapy: Principal | ICD-10-CM

## 2022-10-08 DIAGNOSIS — L409 Psoriasis, unspecified: Principal | ICD-10-CM

## 2022-10-08 DIAGNOSIS — K766 Portal hypertension: Secondary | ICD-10-CM | POA: Insufficient documentation

## 2022-10-08 MED ORDER — COSENTYX PEN 300 MG/2 PENS (150 MG/ML) SUBCUTANEOUS
SUBCUTANEOUS | 5 refills | 28.00000 days | Status: CP
Start: 2022-10-08 — End: ?

## 2022-10-08 MED ORDER — COSENTYX 150 MG/ML SUBCUTANEOUS SYRINGE
SUBCUTANEOUS | 0 refills | 0.00000 days | Status: CP
Start: 2022-10-08 — End: 2022-11-06

## 2022-10-08 MED ORDER — TRIAMCINOLONE ACETONIDE 0.1 % TOPICAL OINTMENT
INTRAMUSCULAR | 5 refills | 0.00000 days | Status: CP
Start: 2022-10-08 — End: ?
  Filled 2022-10-13: qty 60, 28d supply, fill #0

## 2022-10-08 MED ORDER — CLOBETASOL 0.05 % TOPICAL OINTMENT
OPHTHALMIC | 5 refills | 0.00000 days | Status: CP
Start: 2022-10-08 — End: ?
  Filled 2022-10-13: qty 60, 14d supply, fill #0

## 2022-10-08 NOTE — Unmapped (Addendum)
Use over the counter DESITIN for skin folds of the thighs

## 2022-10-08 NOTE — Unmapped (Addendum)
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 No improvement on Ustekinumab [name]) from Jan 2023 to August 2023. No improvement on adalimumab (Humira) from Aug 2023 to October 2023.  - Patient states that when she was on Cosentyx as directed (without any missed doses) it was very effective  - Due to recent diagnosis of cirrhosis, will contact liver doctor Dr. Eudelia Bunch in coordinating care. Sent staff message to GI team.  - Has known osteoarthritis in knees, no other joint pain. Has been seen by a rheumatologist, who diagnosed her with osteoarthritis of bilateral knees, and does not think the patient has psoriatic arthritis.  - Plan to start Cosentyx pending response from Dr. Eudelia Bunch from GI. Will send to [facility].  - Discussed side effects of medication at length including increased risk of infection, nasopharyngitis, rash, diarrhea, exacerbation of IBD, headache  - Monitoring: annual Quantiferon  - Loading dose: 300mg  weekly on weeks 0, 1, 2, 3, 4 followed by 300mg  monthly  - Start clobetasoL (TEMOVATE) 0.05 % ointment; Apply the medication twice daily to stubborn areas of the skin until smooth. Then stop and re-start as the skin changes come back.  - Start triamcinolone (KENALOG) 0.1 % ointment; Apply the medication twice daily to affected areas of the skin until smooth. Then stop and re-start as soon as the skin changes come back.  - Use OTC DESITIN for skin folds of the thighs    High risk medication use, Cosentyx  - Negative quant gold and hepatitis panel on 06/16/22  - Discussed risk of infection in the context of ascites  - Discussed risk of malignancy in the context of personal history of endometrioid adenocarcinoma    The patient was advised to call for an appointment should any new, changing, or symptomatic lesions develop.     RTC: Return in about 3 months (around 01/08/2023) for Psoriasis. or sooner as needed   _________________________________________________________________      Chief Complaint     Chief Complaint   Patient presents with    Psoriasis     Exasperation of psoriasis thru out her body with small red spots for about 10 weeks       HPI     Ashley Briggs is a 56 y.o. female who presents as a returning patient (last seen 07/01/2022) to Dermatology for follow up of plaque psoriasis.    Today patient reports:  - Has spoken about a liver transplant with Dr. Eudelia Bunch (GI), and is considering live donor  - Humira has been ineffective for psoriasis  - Patient is experiencing uncontrollable pruritus  - Has seen a rheumatologist, who diagnosed her with osteoarthritis of bilateral knees, does not believe the patient has psoriatic arthritis    The patient denies any other new or changing lesions or areas of concern.     Pertinent Past Medical History     Past Medical History, Family History, Social History, Medication List, Allergies, and Problem List were reviewed in the rooming section of Epic.     ROS: Other than symptoms mentioned in the HPI, no fevers, chills, or other skin complaints    Physical Examination     GENERAL: Well-appearing female in no  acute distress, resting comfortably.  NEURO: Alert and oriented, answers questions appropriately  SKIN: Examination of the scalp, face, back, bilateral upper extremities, bilateral lower extremities, fingernails, and groin was performed    - Well circumscribed erythematous papules and plaques with overlying silvery scale on diffusely on the scalp, back, buttocks, bilateral lower extremities, flexural surfaces including elbows  - Erythema of the bilateral inguinal folds        All areas not commented on are within normal limits or unremarkable    (Approved Template 08/13/2020)

## 2022-10-09 DIAGNOSIS — L409 Psoriasis, unspecified: Principal | ICD-10-CM

## 2022-10-10 DIAGNOSIS — R188 Other ascites: Principal | ICD-10-CM

## 2022-10-10 DIAGNOSIS — L409 Psoriasis, unspecified: Principal | ICD-10-CM

## 2022-10-10 DIAGNOSIS — R601 Generalized edema: Principal | ICD-10-CM

## 2022-10-10 MED ORDER — COSENTYX PEN 150 MG/ML SUBCUTANEOUS
SUBCUTANEOUS | 0 refills | 0.00000 days | Status: CP
Start: 2022-10-10 — End: ?
  Filled 2022-10-22: qty 8, 28d supply, fill #0

## 2022-10-13 MED FILL — SIMVASTATIN 20 MG TABLET: ORAL | 90 days supply | Qty: 90 | Fill #1

## 2022-10-14 NOTE — Unmapped (Signed)
[facility] Specialty Medication Onboarding    Specialty Medication: XIFAXAN 550 mg Tab (rifAXIMin)  Prior Authorization: Approved   Financial Assistance: No - copay  <$25  Final Copay/Day Supply: $4 / 30    Insurance Restrictions: None     Notes to Pharmacist:     The triage team has completed the benefits investigation and has determined that the patient is able to fill this medication at [facility]. Please contact the patient to complete the onboarding or follow up with the prescribing physician as needed.

## 2022-10-15 DIAGNOSIS — K7682 Hepatic encephalopathy (CMS-HCC): Principal | ICD-10-CM

## 2022-10-15 MED ORDER — LACTULOSE 10 GRAM/15 ML ORAL SOLUTION
Freq: Three times a day (TID) | ORAL | 0 refills | 3 days
Start: 2022-10-15 — End: 2023-10-15

## 2022-10-15 MED ORDER — ACETAMINOPHEN 325 MG TABLET
ORAL_TABLET | Freq: Four times a day (QID) | ORAL | 5 refills | 15 days | PRN
Start: 2022-10-15 — End: 2023-10-15

## 2022-10-15 MED FILL — INSULIN GLARGINE (U-100) 100 UNIT/ML (3 ML) SUBCUTANEOUS PEN: SUBCUTANEOUS | 22 days supply | Qty: 15 | Fill #5

## 2022-10-15 NOTE — Unmapped (Incomplete)
[facility]   Patient Onboarding/Medication Counseling    Ashley Briggs is a 56 y.o. female with Hepatic Encephalpathy who I am counseling today on initiation of therapy.  I am speaking to the patient.    Was a Nurse, learning disability used for this call? No    Verified patient's date of birth / HIPAA.    Specialty medication(s) to be sent: Infectious Disease: Xifaxan      Non-specialty medications/supplies to be sent:   Furosemide 40mg   Lantus Solostar   Cholestyramine light 4 gram powder pack      Medications not needed at this time: n/a         Xifaxan (rifaximin) 550mg  tablets    Medication & Administration     Dosage: Hepatic Encephalopathy Prevention: Take one 550mg  tablet by mouth twice daily.    Administration: Take with or without food.    Adherence/Missed dose instructions:   Take missed dose as soon as you remember. If it is close to the time of your next dose, skip the missed dose and resume with your next scheduled dose.  Do not take extra doses or 2 doses at the same time.    Goals of Therapy     Hepatic Encephalopathy: The goal is to reduce risk of overt hepatic encephalopathy recurrence.    Side Effects & Monitoring Parameters     Common Side Effects:   Peripheral edema  Nausea  Dizziness  Fatigue  Ascites    The following side effects should be reported to the provider:  Signs of an allergic reaction, such as rash; hives; itching; red, swollen, blistered, or peeling skin with or without fever. If you have wheezing; tightness in the chest or throat; trouble breathing, swallowing, or talking; unusual hoarseness; or swelling of the mouth, face, lips, tongue, or throat, call 911 or go to the closest emergency department (ED).   Swelling in the arms, legs or stomach.   Feeling very tired or weak.  Low mood (depression).   Fever.   Diarrhea is common with antibiotics. Rarely, a severe form called C diff-associated diarrhea (CDAD) may happen. Sometimes this has led to a deadly bowel problem (colitis). CDAD may happen during or a few months after taking antibiotics. Call your doctor right away if you have stomach pain, cramps, or very loose, watery, or bloody stools. Check with your doctor before treating diarrhea.    Monitoring Parameters:   For the prevention of hepatic encephalopathy:  Patient should monitor for changes in mental status.     Contraindications, Warnings, & Precautions     Superinfection: Prolonged use may result in fungal or bacterial superinfection, including Clostridioides (formerly Clostridium) difficile-associated diarrhea (CDAD) and pseudomembranous colitis; CDAD has been observed >2 months post-antibiotic treatment.  Severe (Child Pugh Class C) Hepatic Impairment: increased systemic exposure with severe hepatic impairment.  Concomitant use with P-glycoprotein (P-gp) inhibitors: P-gp inhibitors may increase systemic exposure of rifaximin.    Drug/Food Interactions     Medication list reviewed in Epic. The patient was instructed to inform the care team before taking any new medications or supplements.   Drug interactions:   Carvedilol:  Carvedilol can increase Xifaxan levels increasing the possibility of adverse effects with Xifaxan.    Storage, Handling Precautions, & Disposal     Store this medication at room temperature.  Store in a dry place. Do not store in a bathroom.   Keep all drugs out of the reach of children and pets.  Throw away unused or  expired drugs. Do not flush down a toilet or pour down a drain unless you are told to do so. Check with your pharmacist if you have questions about the best way to throw out drugs. There may be drug take-back programs in your area.      Current Medications (including OTC/herbals), Comorbidities and Allergies     Current Outpatient Medications   Medication Sig Dispense Refill    acetaminophen (TYLENOL) 325 MG tablet Take 2 tablets (650 mg total) by mouth two (2) times a day as needed for pain.      aspirin (ECOTRIN) 81 MG tablet Take 1 tablet (81 needed for pain.      aspirin (ECOTRIN) 81 MG tablet Take 1 tablet (81 mg total) by mouth daily. 90 tablet 3    blood-glucose meter kit Use as directed by provider. 1 each 0    buPROPion (WELLBUTRIN XL) 150 MG 24 hr tablet Take 1 tablet (150 mg total) by mouth daily.      canagliflozin (INVOKANA) 300 mg Tab tablet Take 1 tablet (300 mg total) by mouth every morning before breakfast. 30 tablet 1    carvediloL (COREG) 6.25 MG tablet Take 1 tablet (6.25 mg total) by mouth Two (2) times a day. Start with 1 tablet nightly, if tolerating well increase to twice daily 180 tablet 3    cholestyramine-aspartame (CHOLESTYRAMINE LIGHT) 4 gram PwPk Take 1 packet by mouth daily. 60 packet 3    citalopram (CELEXA) 40 MG tablet Take 1 tablet (40 mg total) by mouth daily.      clobetasoL (TEMOVATE) 0.05 % ointment Apply the medication twice daily to stubborn areas of the skin until smooth. Then stop and re-start as the skin changes come back. 60 g 5    clonazePAM (KLONOPIN) 0.5 MG tablet Take 1 tablet (0.5 mg total) by mouth nightly as needed for anxiety and sleep. May repeat dose once. 40 tablet 1    cyclobenzaprine (FLEXERIL) 5 MG tablet Take 1-2 tablets by mouth every 8 hours as needed muscle pain or spasm. 30 tablet 1    dulaglutide (TRULICITY) 1.5 mg/0.5 mL PnIj Inject the contents of 2 syringes (3 mg total) under the skin every seven (7) days. 4 mL 6    ferrous sulfate (SLOW FE) 142 mg (45 mg iron) TbER Take 1 tablet (142 mg) by mouth two (2) times a day. 60 tablet 3    furosemide (LASIX) 40 MG tablet Take 1 tablet (40 mg total) by mouth daily. 90 tablet 3    HUMIRA PEN CITRATE FREE 40 MG/0.4 ML Inject the contents of 1 pen (40 mg total) under the skin every fourteen (14) days. 2 each 0    insulin glargine (LANTUS SOLOSTAR U-100 INSULIN) 100 unit/mL (3 mL) injection pen Inject 0.66 mL (66 Units total) under the skin nightly. 15 mL 6    lactulose 10 gram/15 mL solution Take 30 mL (20 g total) by mouth Three (3) times a day. 240 mL 0    lancets 30 gauge Misc Use as directed to test once daily. 100 each 3    metFORMIN (GLUCOPHAGE-XR) 500 MG 24 hr tablet Take 2 tablets (1,000 mg total) by mouth in the morning and 2 tablets (1,000 mg total) in the evening. Take with meals. 360 tablet 1    pantoprazole (PROTONIX) 40 MG tablet Take 1 tablet (40 mg total) by mouth Two (2) times a day. 180 tablet 1    pen needle, diabetic 32 gauge x 1/4 (  6 mm) Ndle Use as directed to inject Lantus 100 each 3    rifAXIMin (XIFAXAN) 550 mg Tab Take 1 tablet (550 mg total) by mouth two (2) times a day. 60 tablet 11    secukinumab (COSENTYX PEN) 150 mg/mL PnIj injection Inject the contents of 2 pens (150 mg total) under the skin weekly for 5 weeks. Loading dose. 10 mL 0    secukinumab (COSENTYX PEN, 2 PENS,) 150 mg/mL PnIj injection Inject 2 mL (300 mg total) under the skin every twenty-eight (28) days. Maintenance dose 2 mL 5    simvastatin (ZOCOR) 20 MG tablet Take 1 tablet (20 mg total) by mouth every evening. 90 tablet 1    spironolactone (ALDACTONE) 100 MG tablet Take 1 tablet (100 mg total) by mouth daily. 90 tablet 3    triamcinolone (KENALOG) 0.1 % ointment Apply the medication twice daily to affected areas of the skin until smooth. Then stop and re-start as soon as the skin changes come back. 60 g 5    zolpidem (AMBIEN) 5 MG tablet Take 1 tablet (5 mg total) by mouth nightly as needed for sleep.       No current facility-administered medications for this visit.       No Known Allergies    Patient Active Problem List   Diagnosis    PFD (pelvic floor dysfunction)    Type 2 diabetes mellitus, with long-term current use of insulin (CMS-HCC)    Hemorrhage of rectum and anus    Hyperlipidemia    Hypertension    Morbid obesity (CMS-HCC)    Dry eye syndrome, bilateral    Angular blepharoconjunctivitis of both eyes    Cataract associated with type 2 diabetes mellitus (CMS-HCC)    H/O diabetic gastroparesis    Retinopathy of left eye, background, proliferative Coronary artery disease involving native heart without angina pectoris    Hepatic steatosis    Diabetes mellitus (CMS-HCC)    Diabetic retinopathy (CMS-HCC)    Ptosis of eyelid, left    Esophageal varices in cirrhosis (CMS-HCC)    Hepatic cirrhosis (CMS-HCC)    Secondary esophageal varices without bleeding (CMS-HCC)    Hepatic encephalopathy (CMS-HCC)    Hx of psoriasis    Calcified cerebral meningioma (CMS-HCC)    Right upper quadrant abdominal pain    Other ascites    Nausea    Portal hypertension (CMS-HCC)       Reviewed and up to date in Epic.    Appropriateness of Therapy     Acute infections noted within Epic:  No active infections  Patient reported infection: {Blank single:19197::None,***- patient reported to provider,***- pharmacy reported to provider}    Is medication and dose appropriate based on diagnosis and infection status? {Blank single:19197::Yes,No - evidence provided by prescriber in *** note}    Prescription has been clinically reviewed: {Blank single:19197::Yes,***}      Baseline Quality of Life Assessment      {DiseaseSpecificQOL:73897}    Financial Information     Medication Assistance provided: {sscmedassist:65303}    Anticipated copay of $*** reviewed with patient. Verified delivery address.    Delivery Information     Scheduled delivery date: ***    Expected start date: ***    Medication will be delivered via {Blank:19197::UPS,Next Day Courier,Same Day Courier,Clinic Courier - *** clinic,***} to the {Blank:19197::prescription,temporary} address in Epic WAM.  This shipment {Blank single:19197::will,will not} require a signature.      Explained the services we provide at [facility]  and that each month we would call to set up refills.  Stressed importance of returning phone calls so that we could ensure they receive their medications in time each month.  Informed patient that we should be setting up refills 7-10 days prior to when they will run out of medication.  A pharmacist will reach out to perform a clinical assessment periodically.  Informed patient that a welcome packet, containing information about our pharmacy and other support services, a Notice of Privacy Practices, and a drug information handout will be sent.      The patient or caregiver noted above participated in the development of this care plan and knows that they can request review of or adjustments to the care plan at any time.      Patient or caregiver verbalized understanding of the above information as well as how to contact the pharmacy at [phone] option 4 with any questions/concerns.  The pharmacy is open Monday through Friday 8:30am-4:30pm.  A pharmacist is available 24/7 via pager to answer any clinical questions they may have.    Patient Specific Needs     Does the patient have any physical, cognitive, or cultural barriers? {Blank single:19197::No,Yes - ***}    Does the patient have adequate living arrangements? (i.e. the ability to store and take their medication appropriately) {Blank single:19197::Yes,No - ***}    Did you identify any home environmental safety or security hazards? {Blank single:19197::No,Yes - ***}    Patient prefers to have medications discussed with  {Blank single:19197::Patient,Family Member,Caregiver,Other}     Is the patient or caregiver able to read and understand education materials at a high school level or above? {Blank single:19197::No,Yes}    Patient's primary language is  {Blank single:19197::English,Spanish,***}     Is the patient high risk? {sschighriskpts:78327}    SOCIAL DETERMINANTS OF HEALTH     At the [facility], we have learned that life circumstances - like trouble affording food, housing, utilities, or transportation can affect the health of many of our patients.   That is why we wanted to ask: are you currently experiencing any life circumstances that are negatively impacting your health and/or quality of life? {YES/NO/PATIENTDECLINED:93004}    Social Determinants of Health     Financial Resource Strain: Medium Risk (09/23/2022)    Overall Financial Resource Strain (CARDIA)     Difficulty of Paying Living Expenses: Somewhat hard   Internet Connectivity: No Internet connectivity concern identified (09/17/2022)    Internet Connectivity     Do you have access to internet services: Yes     How do you connect to the internet: Personal Device at home     Is your internet connection strong enough for you to watch video on your device without major problems?: Yes     Do you have enough data to get through the month?: Yes     Does at least one of the devices have a camera that you can use for video chat?: Yes   Food Insecurity: Food Insecurity Present (09/23/2022)    Hunger Vital Sign     Worried About Running Out of Food in the Last Year: Sometimes true     Ran Out of Food in the Last Year: Sometimes true   Tobacco Use: Medium Risk (10/08/2022)    Patient History     Smoking Tobacco Use: Never     Smokeless Tobacco Use: Never     Passive Exposure: Current   Housing/Utilities: Low Risk  (09/23/2022)  Housing/Utilities     Within the past 12 months, have you ever stayed: outside, in a car, in a tent, in an overnight shelter, or temporarily in someone else's home (i.e. couch-surfing)?: No     Are you worried about losing your housing?: No     Within the past 12 months, have you been unable to get utilities (heat, electricity) when it was really needed?: No   Alcohol Use: Not At Risk (01/09/2021)    Alcohol Use     How often do you have a drink containing alcohol?: Monthly or less     How many drinks containing alcohol do you have on a typical day when you are drinking?: 1 - 2     How often do you have 5 or more drinks on one occasion?: Never   Transportation Needs: No Transportation Needs (09/23/2022)    PRAPARE - Transportation     Lack of Transportation (Medical): No     Lack of Transportation (Non-Medical): No   Substance Use: Low Risk  (01/09/2021)    Substance Use     Taken prescription drugs for non-medical reasons: Never     Taken illegal drugs: Never     Patient indicated they have taken drugs in the past year for non-medical reasons: Yes, [positive answer(s)]: Not on file   Health Literacy: Low Risk  (01/09/2021)    Health Literacy     : Never   Physical Activity: Insufficiently Active (01/09/2021)    Exercise Vital Sign     Days of Exercise per Week: 1 day     Minutes of Exercise per Session: 40 min   Interpersonal Safety: Not at risk (09/17/2022)    Interpersonal Safety     Unsafe Where You Currently Live: No     Physically Hurt by Anyone: No     Abused by Anyone: No   Stress: Stress Concern Present (09/17/2022)    [employer] of Occupational Health - Occupational Stress Questionnaire     Feeling of Stress : Very much   Intimate Partner Violence: Not At Risk (01/09/2021)    Humiliation, Afraid, Rape, and Kick questionnaire     Fear of Current or Ex-Partner: No     Emotionally Abused: No     Physically Abused: No     Sexually Abused: No   Depression: Not at risk (07/12/2021)    PHQ-2     PHQ-2 Score: 0   Social Connections: Not on file       Would you be willing to receive help with any of the needs that you have identified today? {Yes/No/Not applicable:93005}     ***,{Blank single:19197::RPh,PharmD}  [facility] Specialty Pharmacist

## 2022-10-16 ENCOUNTER — Ambulatory Visit
Admit: 2022-10-16 | Discharge: 2022-10-17 | Payer: PRIVATE HEALTH INSURANCE | Attending: Student in an Organized Health Care Education/Training Program | Primary: Student in an Organized Health Care Education/Training Program

## 2022-10-16 DIAGNOSIS — D320 Benign neoplasm of cerebral meninges: Principal | ICD-10-CM

## 2022-10-16 DIAGNOSIS — K7460 Unspecified cirrhosis of liver: Principal | ICD-10-CM

## 2022-10-16 DIAGNOSIS — G8929 Other chronic pain: Principal | ICD-10-CM

## 2022-10-16 DIAGNOSIS — R519 Bilateral headache: Principal | ICD-10-CM

## 2022-10-16 DIAGNOSIS — Z872 Personal history of diseases of the skin and subcutaneous tissue: Principal | ICD-10-CM

## 2022-10-16 MED ORDER — LACTULOSE 10 GRAM/15 ML ORAL SOLUTION
Freq: Three times a day (TID) | ORAL | 0 refills | 3 days | Status: CP
Start: 2022-10-16 — End: 2023-10-16
  Filled 2022-10-22: qty 240, 3d supply, fill #0

## 2022-10-16 MED ORDER — ACETAMINOPHEN 325 MG TABLET
ORAL_TABLET | Freq: Four times a day (QID) | ORAL | 5 refills | 15 days | PRN
Start: 2022-10-16 — End: 2023-10-16

## 2022-10-16 MED FILL — TRULICITY 1.5 MG/0.5 ML SUBCUTANEOUS PEN INJECTOR: SUBCUTANEOUS | 28 days supply | Qty: 4 | Fill #1

## 2022-10-16 NOTE — Unmapped (Signed)
Ashley Briggs  05-25-1966    Assessment & Plan:  Other chronic pain    Bilateral headache    Hx of psoriasis    Hepatic cirrhosis, unspecified hepatic cirrhosis type, unspecified whether ascites present (CMS-HCC)    Calcified cerebral meningioma (CMS-HCC)      **Pain mangement   **Neurology referral       ENDOCRINOLOGY for DM      No follow-ups on file.    Subjective:  Subjective    HPI: Ashley Briggs is a 56 y.o. female here for Follow up       Abdominal Pain  This is a chronic problem. The current episode started more than 1 month ago. The onset quality is sudden. The problem occurs constantly. The most recent episode lasted 6 hours. The problem has been rapidly worsening. The pain is located in the generalized abdominal region. The pain is at a severity of 9/10. The quality of the pain is cramping, sharp and tearing. The abdominal pain radiates to the RUQ. Associated symptoms include anorexia, arthralgias, belching, flatus, headaches, nausea and weight loss. Pertinent negatives include no constipation, diarrhea, dysuria, fever, frequency, hematochezia, hematuria, melena, myalgias or vomiting. The pain is aggravated by bowel movement, certain positions, coughing, deep breathing, eating, movement and urination. The pain is relieved by Nothing. Prior diagnostic workup includes CT scan, lower endoscopy, surgery, ultrasound and upper endoscopy.     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  She did note that she has had abdominal pain going back as far as 2014 but has had a recent acute exacerbation.  She was subsequently admitted to the hospital for EF>55%, L. Atrium is mildly-moderately dilated in size, LV is normal in size, RV is normal in size.   -Medications that could effect her BP: Coreg, Lasix, Spironolactone   -Statin: Simvastatin 20mg  at bedtime   -Aspirin? Yes daily ASA 81mg  every day       Anxiety, MDD, insomnia   -Follows with Behavioral health   -Medications include: Bupropion 15mg  every day, Citalopram 40mg  every day, Clonazepam 0.5mg  nightly PRN, Zolpidem 5mg  PRN       Chronic pain  -Secondary to ascites  -Referral placed to Pain Management  -Limit Tylenol to 2g/day.NSAIDs should be avoided given her hsitory of decomepnsated cirrhosis and risk of acute renal failure  -If not able to be accepted by pain management then may start pain management with me. However, will need to be cautious as she is on Benzos and very high risk for drug interactions/polypharmacy.     Bilateral headaches, Calcified Cerebral Meningioma  -Requesting referral to Neurology. Referral was placed last visit but did not go through   -No red flag signs per history she reported.   -New referral placed to neurology     Vaginal bleeding, incontinence, hx of endometrial cancer  -Reported vaginal bleeding recently. She has had a hysterectomy in 2007 (hx of endometrial cancer).   -She declined pelvic exam today as she has an appointment with Urogyn on 10/20/22   -Discussed briefly regarding pelvic floor muscle dysfunction but heavily advised to keep appointment with Urogyn for further evaluation and exam as she declined  pelvic exam with our appointment.       Preventative health  -Mammogram due 09/2023   -Colonoscopy due 2028  -Received COVID vaccine at GI   -Will follow up on shingles vaccine   -Due for Twinrex 01/2023  -Pneumococcal vaccine again at age 60 (10/2029)  -Pap: hysterectomy in 2007, pap is not indicated   -DEXA: will consider obtaining prior to age 70 given post-menopausal state and complex PMH    To keep scheduled appointment in December    Subjective:  Subjective She is also on a loop diuretic but given the increased dose of Aldactone, she likely should experience a stable or gradually uptrending potassium.  She has normal renal function with a GFR of over 90 mls per minute and should be able to excrete potassium.   She has an appointment on 10/07/2022 with Hepatology.  She is followed with [facility] Hepatology clinic and as noted, cirrhosis thought to be secondary to [facility].  She has not had a biopsy.  Her most recent upper GI endoscopy did show grade 2 esophageal varices with moderate portal hypertensive gastropathy.  She did not have any hematemesis or coffee-ground emesis or other clinical concern for bleeding esophageal varices.  She is on aspirin 81 mg daily for her coronary artery disease and does use celecoxib 200 mg twice daily, but this was not continued during her hospitalization, and she was told to stop this.  She is on a proton pump inhibitor with pantoprazole for GI prophylaxis.  At this time, her right upper quadrant pain appears to be secondary to chronic liver disease and alternative etiology has been established.  She is discharged home with Percocet and may require longer-term pain management unless improved medical management of her ascites decreases her pain.     2.  Type 2 Diabetes Mellitus complicated by retinopathy and gastroparesis:  Follows with [facility] Endocrinology. BG 90-131 since admission but with large glucosuria (on SGLT-2).  Her A1c increased back up to 9.8 earlier this month from 6.4 in February.  Her home regimen was continued and adjusted as needed based upon blood sugars.     3.  History of coronary artery disease.  She did not complain of any chest pain or anginal equivalents.  She did have mild elevation in high-sensitivity troponin to 39 dropping to 35, likely due to mild strain, the upper limits of normal troponin is 34 and this was thought to have no clinical significance.  Electrocardiogram did not show any acute ischemic changes.  See report and tracing for additional details.  Echocardiogram did not demonstrate any focal wall motion abnormalities.        Was in the hospital for 10 days  She has had a lot of dizziness, migraines,     Abdominal pain but nothing helps  and it is a constant pain on the right side that moves to middle abdomen       Has been having BM daily, had painful BM yesterday   She did have some vaginal bleeding this morning   Hysterectomy in 2007 but has appointment on Monday with Urogyn     Has been having accidents on/off for the past 6 months         ROS:  Review of systems negative unless otherwise noted as per HPI.    Objective:  Objective    Vitals:    10/16/22 1347   BP: 117/74   Pulse: 77   Temp: 37.4 ??C (99.3 ??F)   SpO2: 98%  Body mass index is 37.81 kg/m??.    Wt Readings from Last 3 Encounters:   10/16/22 99.9 kg (220 lb 4.8 oz)   10/07/22 (!) 103 kg (227 lb)   09/29/22 (!) 113.4 kg (250 lb)        Physical Exam:  Physical Exam be secondary to MASH.  She has not had a biopsy.  Her most recent upper GI endoscopy did show grade 2 esophageal varices with moderate portal hypertensive gastropathy.  She did not have any hematemesis or coffee-ground emesis or other clinical concern for bleeding esophageal varices.  She is on aspirin 81 mg daily for her coronary artery disease and does use celecoxib 200 mg twice daily, but this was not continued during her hospitalization, and she was told to stop this.  She is on a proton pump inhibitor with pantoprazole for GI prophylaxis.  At this time, her right upper quadrant pain appears to be secondary to chronic liver disease and alternative etiology has been established.  She is discharged home with Percocet and may require longer-term pain management unless improved medical management of her ascites decreases her pain.     2.  Type 2 Diabetes Mellitus complicated by retinopathy and gastroparesis:  Follows with [facility] Endocrinology. BG 90-131 since admission but with large glucosuria (on SGLT-2).  Her A1c increased back up to 9.8 earlier this month from 6.4 in February.  Her home regimen was continued and adjusted as needed based upon blood sugars.     3.  History of coronary artery disease.  She did not complain of any chest pain or anginal equivalents.  She did have mild elevation in high-sensitivity troponin to 39 dropping to 35, likely due to mild strain, the upper limits of normal troponin is 34 and this was thought to have no clinical significance.  Electrocardiogram did not show any acute ischemic changes.  See report and tracing for additional details.  Echocardiogram did not demonstrate any focal wall motion abnormalities.      TODAY  -she noted that she was in the hospital for 10 days. She has had some dizziness, migraines and abdominal pain since discharge. Her abdominal pain is a constant pain located on the right side and moves to the left side of the abdomen.   -She has been having daily BM  -However she noted she had vaginal bleeding this morning which was concerning for her. She had a hysterectomy in 2007   -She reports incontinence on/off for the past 6 months.           ROS:  Review of systems negative unless otherwise noted as per HPI.    Objective:  Objective    Vitals:    10/16/22 1347   BP: 117/74   Pulse: 77   Temp: 37.4 ??C (99.3 ??F)   SpO2: 98%     Body mass index is 37.81 kg/m??.    Wt Readings from Last 3 Encounters:   10/16/22 99.9 kg (220 lb 4.8 oz)   10/07/22 (!) 103 kg (227 lb)   09/29/22 (!) 113.4 kg (250 lb)        Physical Exam:  Physical Exam   Const: in no acute distress, not toxic or ill appearing   HEENT: normocephalic, atraumatic, EOMI, no nasal congestion   Cardiac: RRR  Pulm: CTAB, no cough/wheeze  AB: soft, distended but improved from prior visit on 09/17/2022. Mild tenderness to palpation on right side of abdomen. No fluid wave present. No rebound tenderness or guarding.  Skin: Psoriatic lesions on abdomen  Neuro: Aox4, gait is better than previously as well   Psych: very pleasant today.

## 2022-10-16 NOTE — Unmapped (Signed)
Sending to pain management, discussed concern regarding using tylenol and cirrhosis

## 2022-10-16 NOTE — Unmapped (Signed)
Please contact [phone].   CLICK EXT 1 to schedule Follow up with Endocrinology

## 2022-10-16 NOTE — Unmapped (Signed)
Patient is requesting the following refill  Requested Prescriptions     Pending Prescriptions Disp Refills    acetaminophen (TYLENOL) 325 MG tablet 120 tablet 5     Sig: Take 2 tablets (650 mg total) by mouth every six (6) hours as needed for pain.       Recent Visits  Date Type Provider Dept   09/17/22 Office Visit Mangel, Benison Pap, DO [facility] [address] [facility]   09/02/22 Office Visit Johnn Hai, Loleta Rose, FNP [facility] [address] [facility]   08/06/22 Office Visit Johnn Hai, Loleta Rose, FNP [place] [address] [facility]   03/06/22 Office Visit Farrug, Emogene Morgan, NP [facility] [address] [facility]   02/11/22 Office Visit Nile Dear, Emogene Morgan, NP [facility] [facility]   01/13/22 Office Visit Nile Dear, Emogene Morgan, NP [place] [facility]   Showing recent visits within past 365 days with a meds authorizing provider and meeting all other requirements  Future Appointments  Date Type Provider Dept   10/16/22 Appointment Mangel, Benison Pap, DO [place] [facility] [address] [facility]   11/11/22 Appointment Mangel, Benison Pap, DO [facility] [facility] [address] At [facility]   Showing future appointments within next 365 days with a meds authorizing provider and meeting all other requirements       Labs: Not applicable this refill

## 2022-10-17 NOTE — Unmapped (Signed)
Addended by: Marquita Palms on: 10/16/2022 04:34 PM     Modules accepted: Orders

## 2022-10-17 NOTE — Unmapped (Signed)
Ashley Briggs - [facility] Specialty Medication Onboarding    Specialty Medication: secukinumab: COSENTYX PEN (2 PENS) 150 mg/mL Pnij injection  Prior Authorization: Approved   Financial Assistance: No - copay  <$25  Final Copay/Day Supply: $4 / 28 (LD)          $4 / 28  (MD)    Insurance Restrictions: Yes - max 1 month supply     Notes to Pharmacist: must fill 8 ml for 28 days, then 2 ml for 28 days per insurance for loading does  must fill 2 ml for 28 days, then 2 ml for 28 days per insurance malignance dose    The triage team has completed the benefits investigation and has determined that the patient is able to fill this medication at [facility]. Please contact the patient to complete the onboarding or follow up with the prescribing physician as needed.

## 2022-10-21 NOTE — Unmapped (Signed)
Patient has been on Cosentyx previously.     [facility]   Patient Onboarding/Medication Counseling    Ashley Briggs is a 56 y.o. female with psoriasis who I am counseling today on initiation of therapy.  I am speaking to the patient.    Was a Nurse, learning disability used for this call? No    Verified patient's date of birth / HIPAA.    Specialty medication(s) to be sent: Inflammatory Disorders: Cosentyx      Non-specialty medications/supplies to be sent: lactulose      Medications not needed at this time: na         The patient declined counseling on medication administration, missed dose instructions, goals of therapy, side effects and monitoring parameters, warnings and precautions, drug/food interactions, and storage, handling precautions, and disposal because they have taken the medication previously. The information in the declined sections below are for informational purposes only and was not discussed with patient.       Cosentyx (secukinumab)    Medication & Administration     Dosage: Plaque psoriasis: Inject 300mg  under the skin at weeks 0, 1, 2, 3, and 4 followed by 300mg  every 4 weeks      Lab tests required prior to treatment initiation:  Tuberculosis: Tuberculosis screening resulted in a non-reactive Quantiferon TB Gold assay.      Administration:     Prefilled Sensoready?? auto-injector pen  Gather all supplies needed for injection on a clean, flat working surface: medication pen removed from packaging, alcohol swab, sharps container, etc.  Look at the medication label - look for correct medication, correct dose, and check the expiration date  Look at the medication - the liquid visible in the window on the side of the pen device should appear clear and colorless to slightly yellow  Lay the auto-injector pen on a flat surface and allow it to warm up to room temperature for at least 15-30 minutes  Select injection site - you can use the front of your thigh or your belly (but not the area 2 inches around your belly button); if someone else is giving you the injection you can also use your upper arm in the skin covering your triceps muscle  Prepare injection site - wash your hands and clean the skin at the injection site with an alcohol swab and let it air dry, do not touch the injection site again before the injection  Twist off the purple safety cap in the direction of the arrow, do not remove until immediately prior to injection and do not touch the yellow needle cover  Put the white needle cover against your skin at the injection site at a 90 degree angle, hold the pen such that you can see the clear medication window  Press down and hold the pen firmly against your skin, there will be a click when the injection starts  Continue to hold the pen firmly against your skin for about 10-15 seconds - the window will start to turn solid green  There will be a second click sound when the injection is almost complete, verify the window is solid green to indicate the injection is complete and then pull the pen away from your skin  Dispose of the used auto-injector pen immediately in your sharps disposal container the needle will be covered automatically  If you see any blood at the injection site, press a cotton ball or gauze on the site and maintain pressure until the bleeding stops, do not  rub the injection site      Adherence/Missed dose instructions:  If your injection is given more than 4 days after your scheduled injection date - consult your pharmacist for additional instructions on how to adjust your dosing schedule.        Goals of Therapy       Plaque Psoriasis  Minimize areas of skin involvement (% BSA)  Avoidance of long term glucocorticoid use  Maintenance of effective psychosocial functioning    Side Effects & Monitoring Parameters     Injection site reaction (redness, irritation, inflammation localized to the site of administration)  Signs of a common cold - minor sore throat, runny or stuffy nose, etc.  Diarrhea    The following side effects should be reported to the provider:  Signs of a hypersensitivity reaction - rash; hives; itching; red, swollen, blistered, or peeling skin; wheezing; tightness in the chest or throat; difficulty breathing, swallowing, or talking; swelling of the mouth, face, lips, tongue, or throat; etc.  Reduced immune function - report signs of infection such as fever; chills; body aches; very bad sore throat; ear or sinus pain; cough; more sputum or change in color of sputum; pain with passing urine; wound that will not heal, etc.  Also at a slightly higher risk of some malignancies (mainly skin and blood cancers) due to this reduced immune function.  In the case of signs of infection - the patient should hold the next dose of Cosentyx?? and call your primary care provider to ensure adequate medical care.  Treatment may be resumed when infection is treated and patient is asymptomatic.  Muscle pain or weakness  Shortness of breath      Warnings, Precautions, & Contraindications     Have your bloodwork checked as you have been told by your prescriber  Talk with your doctor if you are pregnant, planning to become pregnant, or breastfeeding  Discuss the possible need for holding your dose(s) of Cosentyx?? when a planned procedure is scheduled with the prescriber as it may delay healing/recovery timeline       Drug/Food Interactions     Medication list reviewed in Epic. The patient was instructed to inform the care team before taking any new medications or supplements. No drug interactions identified.   If you have a latex allergy use caution when handling, the needle cap of the Cosentyx?? prefilled syringe and the safety cap for the Cosentyx Sensoready?? pen contains a derivative of natural rubber latex  Talk with you prescriber or pharmacist before receiving any live vaccinations while taking this medication and after you stop taking it      Storage, Handling Precautions, & Disposal     Store this medication in the refrigerator.  Do not freeze  May store intact Sensoready pens and 150 mg/mL prefilled syringes at ?30??C (?86??F) for up to 4 days; may return to the refrigerator if unused  Store in original packaging, protected from light  Do not shake  Dispose of used syringes/pens in a sharps disposal container           Current Medications (including OTC/herbals), Comorbidities and Allergies     Current Outpatient Medications   Medication Sig Dispense Refill    acetaminophen (TYLENOL) 325 MG tablet Take 2 tablets (650 mg total) by mouth two (2) times a day as needed for pain.      aspirin (ECOTRIN) 81 MG tablet Take 1 tablet (81 mg total) by mouth daily. 90 tablet 3    blood-glucose  meter kit Use as directed by provider. 1 each 0    buPROPion (WELLBUTRIN XL) 150 MG 24 hr tablet Take 1 tablet (150 mg total) by mouth daily.      canagliflozin (INVOKANA) 300 mg Tab tablet Take 1 tablet (300 mg total) by mouth every morning before breakfast. 30 tablet 1    carvediloL (COREG) 6.25 MG tablet Take 1 tablet (6.25 mg total) by mouth Two (2) times a day. Start with 1 tablet nightly, if tolerating well increase to twice daily 180 tablet 3    cholestyramine-aspartame (CHOLESTYRAMINE LIGHT) 4 gram PwPk Mix 1 packet as directed and take by mouth daily. 60 packet 5    citalopram (CELEXA) 40 MG tablet Take 1 tablet (40 mg total) by mouth daily.      clobetasoL (TEMOVATE) 0.05 % ointment Apply the medication twice daily to stubborn areas of the skin until smooth. Then stop and re-start as the skin changes come back. 60 g 5    clonazePAM (KLONOPIN) 0.5 MG tablet Take 1 tablet (0.5 mg total) by mouth nightly as needed for anxiety and sleep. May repeat dose once. 40 tablet 1    cyclobenzaprine (FLEXERIL) 5 MG tablet Take 1-2 tablets by mouth every 8 hours as needed muscle pain or spasm. 30 tablet 1    dulaglutide (TRULICITY) 1.5 mg/0.5 mL PnIj Inject the contents of 2 syringes (3 mg total) under the skin every seven (7) days. 4 mL 6    ferrous sulfate (SLOW FE) 142 mg (45 mg iron) TbER Take 1 tablet (142 mg) by mouth two (2) times a day. 60 tablet 3    furosemide (LASIX) 40 MG tablet Take 1 tablet (40 mg total) by mouth daily. 90 tablet 3    insulin glargine (LANTUS SOLOSTAR U-100 INSULIN) 100 unit/mL (3 mL) injection pen Inject 0.66 mL (66 Units total) under the skin nightly. 15 mL 6    lactulose 10 gram/15 mL solution Take 30 mL (20 g total) by mouth Three (3) times a day. 240 mL 0    lancets 30 gauge Misc Use as directed to test once daily. 100 each 3    metFORMIN (GLUCOPHAGE-XR) 500 MG 24 hr tablet Take 2 tablets (1,000 mg total) by mouth in the morning and 2 tablets (1,000 mg total) in the evening. Take with meals. 360 tablet 1    pantoprazole (PROTONIX) 40 MG tablet Take 1 tablet (40 mg total) by mouth Two (2) times a day. 180 tablet 1    pen needle, diabetic 32 gauge x 1/4 (6 mm) Ndle Use as directed to inject Lantus 100 each 3    rifAXIMin (XIFAXAN) 550 mg Tab Take 1 tablet (550 mg total) by mouth two (2) times a day. 60 tablet 11    secukinumab (COSENTYX PEN) 150 mg/mL PnIj injection Inject the contents of 2 pens (150 mg total) under the skin weekly for 5 weeks. Loading dose. 10 mL 0    secukinumab (COSENTYX PEN, 2 PENS,) 150 mg/mL PnIj injection Inject 2 mL (300 mg total) under the skin every twenty-eight (28) days. Maintenance dose 2 mL 5    simvastatin (ZOCOR) 20 MG tablet Take 1 tablet (20 mg total) by mouth every evening. 90 tablet 1    spironolactone (ALDACTONE) 100 MG tablet Take 1 tablet (100 mg total) by mouth daily. 90 tablet 3    triamcinolone (KENALOG) 0.1 % ointment Apply the medication twice daily to affected areas of the skin until smooth. Then stop and re-start  as soon as the skin changes come back. 60 g 5    zolpidem (AMBIEN) 5 MG tablet Take 1 tablet (5 mg total) by mouth nightly as needed for sleep.       No current facility-administered medications for this visit.       No Known Allergies    Patient Active Problem List   Diagnosis    PFD (pelvic floor dysfunction)    Type 2 diabetes mellitus, with long-term current use of insulin (CMS-HCC)    Hemorrhage of rectum and anus    Hyperlipidemia    Hypertension    Morbid obesity (CMS-HCC)    Dry eye syndrome, bilateral    Angular blepharoconjunctivitis of both eyes    Cataract associated with type 2 diabetes mellitus (CMS-HCC)    H/O diabetic gastroparesis    Retinopathy of left eye, background, proliferative    Coronary artery disease involving native heart without angina pectoris    Hepatic steatosis    Diabetes mellitus (CMS-HCC)    Diabetic retinopathy (CMS-HCC)    Ptosis of eyelid, left    Esophageal varices in cirrhosis (CMS-HCC)    Hepatic cirrhosis (CMS-HCC)    Secondary esophageal varices without bleeding (CMS-HCC)    Hepatic encephalopathy (CMS-HCC)    Hx of psoriasis    Calcified cerebral meningioma (CMS-HCC)    Right upper quadrant abdominal pain    Other ascites    Nausea    Portal hypertension (CMS-HCC)       Reviewed and up to date in Epic.    Appropriateness of Therapy     Acute infections noted within Epic:  No active infections  Patient reported infection: None    Is medication and dose appropriate based on diagnosis and infection status? Yes    Prescription has been clinically reviewed: Yes      Baseline Quality of Life Assessment      How many days over the past month did your psoriasis  keep you from your normal activities? For example, brushing your teeth or getting up in the morning. Patient declined to answer    Financial Information     Medication Assistance provided: Prior Authorization    Anticipated copay of $4 reviewed with patient. Verified delivery address.    Delivery Information     Scheduled delivery date: 11/22    Expected start date: 11/22    Medication will be delivered via Same Day Courier to the prescription address in [facility].  This shipment will not require a signature.      Explained the services we provide at [facility] and that each month we would call to set up refills.  Stressed importance of returning phone calls so that we could ensure they receive their medications in time each month.  Informed patient that we should be setting up refills 7-10 days prior to when they will run out of medication.  A pharmacist will reach out to perform a clinical assessment periodically.  Informed patient that a welcome packet, containing information about our pharmacy and other support services, a Notice of Privacy Practices, and a drug information handout will be sent.      The patient or caregiver noted above participated in the development of this care plan and knows that they can request review of or adjustments to the care plan at any time.      Patient or caregiver verbalized understanding of the above information as well as how to contact the pharmacy at [phone] option  4 with any questions/concerns.  The pharmacy is open Monday through Friday 8:30am-4:30pm.  A pharmacist is available 24/7 via pager to answer any clinical questions they may have.    Patient Specific Needs     Does the patient have any physical, cognitive, or cultural barriers? No    Does the patient have adequate living arrangements? (i.e. the ability to store and take their medication appropriately) Yes    Did you identify any home environmental safety or security hazards? No    Patient prefers to have medications discussed with  Patient     Is the patient or caregiver able to read and understand education materials at a high school level or above? Yes    Patient's primary language is  English     Is the patient high risk? No    SOCIAL DETERMINANTS OF HEALTH     At the [facility], we have learned that life circumstances - like trouble affording food, housing, utilities, or transportation can affect the health of many of our patients.   That is why we wanted to ask: are you currently experiencing any life circumstances that are negatively impacting your health and/or quality of life? Patient declined to answer    Social Determinants of Health     Financial Resource Strain: Medium Risk (09/23/2022)    Overall Financial Resource Strain (CARDIA)     Difficulty of Paying Living Expenses: Somewhat hard   Internet Connectivity: No Internet connectivity concern identified (09/17/2022)    Internet Connectivity     Do you have access to internet services: Yes     How do you connect to the internet: Personal Device at home     Is your internet connection strong enough for you to watch video on your device without major problems?: Yes     Do you have enough data to get through the month?: Yes     Does at least one of the devices have a camera that you can use for video chat?: Yes   Food Insecurity: Food Insecurity Present (09/23/2022)    Hunger Vital Sign     Worried About Running Out of Food in the Last Year: Sometimes true     Ran Out of Food in the Last Year: Sometimes true   Tobacco Use: Medium Risk (10/19/2022)    Patient History     Smoking Tobacco Use: Never     Smokeless Tobacco Use: Never     Passive Exposure: Current   Housing/Utilities: Low Risk  (09/23/2022)    Housing/Utilities     Within the past 12 months, have you ever stayed: outside, in a car, in a tent, in an overnight shelter, or temporarily in someone else's home (i.e. couch-surfing)?: No     Are you worried about losing your housing?: No     Within the past 12 months, have you been unable to get utilities (heat, electricity) when it was really needed?: No   Alcohol Use: Not At Risk (01/09/2021)    Alcohol Use     How often do you have a drink containing alcohol?: Monthly or less     How many drinks containing alcohol do you have on a typical day when you are drinking?: 1 - 2     How often do you have 5 or more drinks on one occasion?: Never   Transportation Needs: No Transportation Needs (09/23/2022)    PRAPARE - Therapist, art (Medical): No  Lack of Transportation (Non-Medical): No   Substance Use: Low Risk  (01/09/2021)    Substance Use     Taken prescription drugs for non-medical reasons: Never     Taken illegal drugs: Never     Patient indicated they have taken drugs in the past year for non-medical reasons: Yes, [positive answer(s)]: Not on file   Health Literacy: Low Risk  (01/09/2021)    Health Literacy     : Never   Physical Activity: Insufficiently Active (01/09/2021)    Exercise Vital Sign     Days of Exercise per Week: 1 day     Minutes of Exercise per Session: 40 min   Interpersonal Safety: Not at risk (09/17/2022)    Interpersonal Safety     Unsafe Where You Currently Live: No     Physically Hurt by Anyone: No     Abused by Anyone: No   Stress: Stress Concern Present (09/17/2022)    [employer] of Occupational Health - Occupational Stress Questionnaire     Feeling of Stress : Very much   Intimate Partner Violence: Not At Risk (01/09/2021)    Humiliation, Afraid, Rape, and Kick questionnaire     Fear of Current or Ex-Partner: No     Emotionally Abused: No     Physically Abused: No     Sexually Abused: No   Depression: Not at risk (07/12/2021)    PHQ-2     PHQ-2 Score: 0   Social Connections: Not on file       Would you be willing to receive help with any of the needs that you have identified today? Not applicable       Winford Hehn A Desiree Lucy [facility] Specialty Pharmacist

## 2022-10-25 DIAGNOSIS — R188 Other ascites: Principal | ICD-10-CM

## 2022-10-25 DIAGNOSIS — R601 Generalized edema: Principal | ICD-10-CM

## 2022-10-25 DIAGNOSIS — Z794 Long term (current) use of insulin: Principal | ICD-10-CM

## 2022-10-25 DIAGNOSIS — E1169 Type 2 diabetes mellitus with other specified complication: Principal | ICD-10-CM

## 2022-10-25 MED ORDER — METFORMIN ER 500 MG TABLET,EXTENDED RELEASE 24 HR
ORAL_TABLET | Freq: Two times a day (BID) | ORAL | 1 refills | 90 days
Start: 2022-10-25 — End: 2023-10-25

## 2022-10-26 MED ORDER — METFORMIN ER 500 MG TABLET,EXTENDED RELEASE 24 HR
ORAL_TABLET | Freq: Two times a day (BID) | ORAL | 1 refills | 90 days | Status: CP
Start: 2022-10-26 — End: 2023-10-26
  Filled 2022-10-28: qty 360, 90d supply, fill #0

## 2022-10-27 ENCOUNTER — Telehealth: Payer: Self-pay | Admitting: Internal Medicine

## 2022-10-27 NOTE — Unmapped (Deleted)
Patient is requesting the following refill  Requested Prescriptions      No prescriptions requested or ordered in this encounter       This patients last WCC/CPE date: Not Found     Recent Visits  Date Type Provider Dept   10/16/22 Office Visit Mangel, Benison Pap, DO [place] [facility] [address] [facility]   09/17/22 Office Visit Mangel, Benison Pap, DO [place] [facility] [address] At [facility]   09/02/22 Office Visit Johnn Hai, Loleta Rose, FNP [place] [facility] [address] At [facility]   08/06/22 Office Visit Johnn Hai, Loleta Rose, FNP [place] [facility] [address] At [facility]   03/06/22 Office Visit Farrug, Emogene Morgan, NP [place] [facility] [address] At [facility]   02/11/22 Office Visit Nile Dear, Emogene Morgan, NP [place] [facility]   01/13/22 Office Visit Nile Dear, Emogene Morgan, NP [place] [facility]   Showing recent visits within past 365 days with a meds authorizing provider and meeting all other requirements  Future Appointments  Date Type Provider Dept   11/12/22 Appointment Mangel, Benison Pap, DO [place] [facility] [address] [facility]   Showing future appointments within next 365 days with a meds authorizing provider and meeting all other requirements       Last Weights:   Wt Readings from Last 3 Encounters:   10/16/22 99.9 kg (220 lb 4.8 oz)   10/07/22 (!) 103 kg (227 lb)   09/29/22 (!) 113.4 kg (250 lb)

## 2022-10-27 NOTE — Telephone Encounter (Signed)
Pt is calling to schedule a NPA appt on 12/12/21 at 1:00p CB- [phone]

## 2022-10-27 NOTE — Telephone Encounter (Signed)
Appt sch'd with Dr Rosana Berger

## 2022-10-28 MED FILL — CLOBETASOL 0.05 % TOPICAL OINTMENT: 14 days supply | Qty: 60 | Fill #1

## 2022-10-28 MED FILL — INVOKANA 300 MG TABLET: ORAL | 30 days supply | Qty: 30 | Fill #1

## 2022-10-31 DIAGNOSIS — R601 Generalized edema: Principal | ICD-10-CM

## 2022-10-31 DIAGNOSIS — R188 Other ascites: Principal | ICD-10-CM

## 2022-11-04 ENCOUNTER — Ambulatory Visit
Admit: 2022-11-04 | Discharge: 2022-11-05 | Payer: PRIVATE HEALTH INSURANCE | Attending: Student in an Organized Health Care Education/Training Program | Primary: Student in an Organized Health Care Education/Training Program

## 2022-11-04 DIAGNOSIS — Z794 Long term (current) use of insulin: Principal | ICD-10-CM

## 2022-11-04 DIAGNOSIS — R188 Other ascites: Principal | ICD-10-CM

## 2022-11-04 DIAGNOSIS — R601 Generalized edema: Principal | ICD-10-CM

## 2022-11-04 DIAGNOSIS — Z23 Encounter for immunization: Principal | ICD-10-CM

## 2022-11-04 DIAGNOSIS — E113212 Type 2 diabetes mellitus with mild nonproliferative diabetic retinopathy with macular edema, left eye: Principal | ICD-10-CM

## 2022-11-04 LAB — HEMOGLOBIN A1C: Hemoglobin A1C: 7.8

## 2022-11-04 NOTE — Unmapped (Addendum)
Assessment and Plan:         Diagnoses and all orders for this visit:    Need for shingles vaccine  -     Discontinue: varicella-zoster gE-AS01B, PF, (SHINGRIX, PF,) 50 mcg/0.5 mL SusR injection; Inject 0.5 mL into the muscle once for 1 dose. Please return in 2-6 months for second dose  -     varicella-zoster gE-AS01B, PF, (SHINGRIX, PF,) 50 mcg/0.5 mL SusR injection; Inject 0.5 mL into the muscle once for 1 dose. 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, with long-term current use of insulin (CMS-HCC)  -     POCT glycosylated hemoglobin (Hb A1C)    Hepatic cirrhosis, unspecified hepatic cirrhosis type, unspecified whether ascites present (CMS-HCC)    Hypertension, unspecified type    Hx of psoriasis    PFD (pelvic floor dysfunction)    Other chronic pain    Calcified cerebral meningioma (CMS-HCC)    Secondary esophageal varices without bleeding (CMS-HCC)    Moderate episode of recurrent major depressive disorder (CMS-HCC)      --------------------------------------  56 yo female presents for follow up     T2DM  -Patient to become re-established with Endocrinology.   -A1C was checked (however was obtained too soon) but was 7.8%.   -Medications include: Trulicity 3mg  q7 days, Metformin 1000mg  BID, Invokana 300mg    -Diabetic Retinal Eye exam in 12/2022    Hypertension, HLD  -BP remains at goal per ADA at <130/80  -Statin: Taking Simvastatin 20mg  every day   -Anti-hypertensive's include: Coreg, Lasix, Spironolactone    Decompensated MASH Cirrhosis with ascites, non-bleeding esophageal varices  -Weight is stable, she has not had significant fluid build back up since our last visit   -BB: Carvedilol 6.25mg  BID  -Diuretics: Lasix 40mg  every day, Spironolactone 100mg  every day  -PPI: Protonix 40mg  BID  -Pruritus: Cholestyramine   -Bowel regimen: Lactulose 20g TID   -Additional agents: Rifaximin 550mg  BID  -Things requiring follow up:HCC Screening due 12/2022   -Hepatology appointment in 01/2023    Plaquie Psoriasis, Eczema   -Has appointment next week with Dermatology   -Medications: Triamcinolone, Secukinumab    Chronic pain  -She has appointment next week with pain management   -Currently taking Tylenol 2g, discussed caution with NSAIDs as do to concern for causing renal toxicity/causing renal injury in the setting of decompensated cirrhosis     Pelvic Floor Dysfunction, vaginal bleeding  -Denies incontinence at this time and noted that vaginal bleeding has not occurred again.   -She declined GU exam today.   -Urogyn appointment in 03/2023    Bilateral headaches, Calcified Cerebral Meningioma   -provided her number to contact of where neurology referral was placed too as she would still like to be evaluated.     Immunization  -Shingles vaccine Rx printed and given to patient to take to pharmacy     Preventative health  -Mammogram due 09/2023   -Colonoscopy due 2028  -Due for Twinrex 01/2023  -Pneumococcal vaccine again at age 17 (10/2029)  -Pap: hysterectomy in 2007, pap is not indicated   -DEXA: will consider obtaining prior to age 84 given post-menopausal state and complex PMH      To follow up in 3 months or sooner for acute concerns    Subjective:     Ashley Briggs 56 y.o.female  presents for follow up  Answers submitted by the patient for this visit:  Diabetes Questionnaire (Submitted on 10/30/2022)  Chief Complaint: Diabetes problem  Diabetes type: type 2  MedicAlert ID: No  Disease duration: 1999 Years  blurred vision: Yes  chest pain: No  fatigue: Yes  foot paresthesias: Yes  foot ulcerations: No  polydipsia: Yes  polyphagia: No  polyuria: Yes  visual change: Yes  weakness: Yes  weight loss: Yes  Symptom course: worsening  confusion: Yes  dizziness: Yes  headaches: Yes  hunger: No  mood changes: No  nervous/anxious: Yes  pallor: No  seizures: No  sleepiness: No  speech difficulty: No  sweats: No  tremors: Yes  blackouts: No  hospitalization: No  nocturnal hypoglycemia: Yes  required assistance: No  required glucagon: No  CVA: No  heart disease: Yes  impotence: No  nephropathy: No  peripheral neuropathy: No  PVD: No  retinopathy: Yes  CAD risks: dyslipidemia, family history, hypertension, obesity, post-menopausal, sedentary lifestyle, stress  Current treatments: diet, insulin injections, oral agent (triple therapy)  Treatment compliance: all of the time  Dose schedule: pre-dinner  Given by: patient  Injection sites: abdominal wall  Home blood tests: 1-2 x per day  Home urines: <1 x per month  Monitoring compliance: good  Blood glucose trend: decreasing steadily  breakfast time: 9-10 am  breakfast glucose level: 70-90  lunch time: 2-3 pm  lunch glucose level: >200  dinner time: after 8 pm  dinner glucose level: 180-200  High score: 90-110  Overall: 130-140  Weight trend: decreasing rapidly  Current diet: diabetic, low salt  Meal planning: avoidance of concentrated sweets  Exercise: rarely  Dietitian visit: No  Eye exam current: No  Sees podiatrist: No      She does not eat bread anymore and watches her salt.   Walking her dog is her exercise (10-15 minutes around the yard, 5 times a day)  She feels like she is cold all the time now, for the past 2-3 months.   She has appointment with pain clinic next week. She is taking Tylenol (less than 2g)     She does have incontinence, but denies prolapse or vaginal bleeding.   She reports having a burning sensation of her tongue and that sometimes she will get choked on her pills but has no issues eating. She did previously see ENT and had laryngoscopy which was normal.     She recently had her birthday       ROS:     ROS negative unless otherwise noted in HPI    Vital Signs:     Wt Readings from Last 3 Encounters:   11/04/22 99.3 kg (219 lb)   10/16/22 99.9 kg (220 lb 4.8 oz)   10/07/22 (!) 103 kg (227 lb)     Temp Readings from Last 3 Encounters:   11/04/22 36.3 ??C (97.4 ??F) (Temporal)   10/16/22 37.4 ??C (99.3 ??F) (Oral)   10/07/22 36.1 ??C (97 ??F) (Tympanic)     BP Readings from Last 3 Encounters:   11/04/22 118/62   10/16/22 117/74   10/07/22 118/53     Pulse Readings from Last 3 Encounters:   11/04/22 79   10/16/22 77   10/07/22 70       Objective:     Const: in no acute distress, not toxic or ill appearing   HEENT: normocephalic, atraumatic, EOMI, no nasal congestion. No gross abnormalities to oral cavity, does not appear to have issues swallowing   Cardiac:  RRR, murmur  Pulm: CTAB, no cough/wheeze  AB: soft, distended but similar to last visit on 10/16/2022. Abdomen is not indurated/firm or tender to palpation. No fluid wave, rebound tenderness or guarding.   Neuro: Aox4, gait is better than previously as well   Psych: very pleasant today.

## 2022-11-04 NOTE — Unmapped (Addendum)
Referral sent to [facility]/Dr. Hazel Sams A. Sherryll Burger. (T) [phone]

## 2022-11-05 MED FILL — TRIAMCINOLONE ACETONIDE 0.1 % TOPICAL OINTMENT: 28 days supply | Qty: 60 | Fill #1

## 2022-11-05 MED FILL — INSULIN GLARGINE (U-100) 100 UNIT/ML (3 ML) SUBCUTANEOUS PEN: SUBCUTANEOUS | 22 days supply | Qty: 15 | Fill #6

## 2022-11-06 DIAGNOSIS — G894 Chronic pain syndrome: Secondary | ICD-10-CM | POA: Insufficient documentation

## 2022-11-06 DIAGNOSIS — G4709 Other insomnia: Secondary | ICD-10-CM | POA: Insufficient documentation

## 2022-11-06 DIAGNOSIS — F321 Major depressive disorder, single episode, moderate: Secondary | ICD-10-CM | POA: Insufficient documentation

## 2022-11-06 NOTE — Telephone Encounter (Signed)
Called the patient and left a voice message (this Probation officer received her message today).  There is no medication to substitute for Klonopin at this time.  The medication was discontinued given concern of her liver condition.  She was advised to contact the office if any further questions or concerns.

## 2022-11-07 ENCOUNTER — Other Ambulatory Visit: Payer: Self-pay | Admitting: Psychiatry

## 2022-11-09 ENCOUNTER — Other Ambulatory Visit: Payer: Self-pay | Admitting: Psychiatry

## 2022-11-10 DIAGNOSIS — H9202 Otalgia, left ear: Principal | ICD-10-CM

## 2022-11-11 ENCOUNTER — Ambulatory Visit: Admit: 2022-11-11 | Discharge: 2022-11-12 | Payer: PRIVATE HEALTH INSURANCE

## 2022-11-11 DIAGNOSIS — Z79899 Other long term (current) drug therapy: Principal | ICD-10-CM

## 2022-11-11 DIAGNOSIS — R188 Other ascites: Principal | ICD-10-CM

## 2022-11-11 DIAGNOSIS — L409 Psoriasis, unspecified: Principal | ICD-10-CM

## 2022-11-11 DIAGNOSIS — R601 Generalized edema: Principal | ICD-10-CM

## 2022-11-11 MED ORDER — SPIRONOLACTONE 100 MG TABLET
ORAL_TABLET | Freq: Every day | ORAL | 2 refills | 30 days
Start: 2022-11-11 — End: 2023-02-09

## 2022-11-11 MED ORDER — PROMETHAZINE 12.5 MG TABLET
ORAL_TABLET | Freq: Three times a day (TID) | ORAL | 1 refills | 20 days | PRN
Start: 2022-11-11 — End: 2023-11-11

## 2022-11-11 NOTE — Unmapped (Signed)
[facility] releases most results to you as soon as they are available. Therefore, you may see some results before we do. Please give us 3 business days to review the tests and contact you by phone or through MyChart. If you are concerned that some results may be upsetting or confusing, you may wish to wait until we contact you before looking at the report in MyChart.   If you have an urgent question, you can send us a message or call our clinic. Otherwise, we prefer that you wait 3 business days for us to contact you.    [facility] Clinical Team

## 2022-11-11 NOTE — Unmapped (Signed)
Ashley Briggs reports her Cosentyx has started to work and she's had no new outbreaks.     [facility] Clinical Assessment & Refill Coordination Note    Charlanne Ilg, DOB: 10/26/1966  Phone: There are no phone numbers on file.    All above HIPAA information was verified with patient.     Was a Nurse, learning disability used for this call? No    Specialty Medication(s):   Inflammatory Disorders: Cosentyx     Current Outpatient Medications   Medication Sig Dispense Refill    acetaminophen (TYLENOL) 325 MG tablet Take 2 tablets (650 mg total) by mouth two (2) times a day as needed for pain.      aspirin (ECOTRIN) 81 MG tablet Take 1 tablet (81 mg total) by mouth daily. 90 tablet 3    blood-glucose meter kit Use as directed by provider. 1 each 0    canagliflozin (INVOKANA) 300 mg Tab tablet Take 1 tablet (300 mg total) by mouth every morning before breakfast. 30 tablet 1    carvediloL (COREG) 6.25 MG tablet Take 1 tablet (6.25 mg total) by mouth Two (2) times a day. Start with 1 tablet nightly, if tolerating well increase to twice daily 180 tablet 3    cholestyramine-aspartame (CHOLESTYRAMINE LIGHT) 4 gram PwPk Mix 1 packet as directed and take by mouth daily. 60 packet 5    citalopram (CELEXA) 40 MG tablet Take 1 tablet (40 mg total) by mouth daily.      clobetasoL (TEMOVATE) 0.05 % ointment Apply the medication twice daily to stubborn areas of the skin until smooth. Then stop and re-start as the skin changes come back. 60 g 5    clonazePAM (KLONOPIN) 0.5 MG tablet Take 1 tablet (0.5 mg total) by mouth nightly as needed for anxiety and sleep. May repeat dose once. 40 tablet 1    cyclobenzaprine (FLEXERIL) 5 MG tablet Take 1-2 tablets by mouth every 8 hours as needed muscle pain or spasm. 30 tablet 1    dulaglutide (TRULICITY) 1.5 mg/0.5 mL PnIj Inject the contents of 2 syringes (3 mg total) under the skin every seven (7) days. 4 mL 6    ferrous sulfate (SLOW FE) 142 mg (45 mg iron) TbER Take 1 tablet (142 mg) by mouth two (2) times a day. 60 tablet 3    furosemide (LASIX) 40 MG tablet Take 1 tablet (40 mg total) by mouth daily. 90 tablet 3    insulin glargine (LANTUS SOLOSTAR U-100 INSULIN) 100 unit/mL (3 mL) injection pen Inject 0.66 mL (66 Units total) under the skin nightly. 15 mL 6    lactulose 10 gram/15 mL solution Take 30 mL (20 g total) by mouth Three (3) times a day. 240 mL 0    lancets 30 gauge Misc Use as directed to test once daily. 100 each 3    metFORMIN (GLUCOPHAGE-XR) 500 MG 24 hr tablet Take 2 tablets (1,000 mg total) by mouth in the morning and 2 tablets (1,000 mg total) in the evening. Take with meals. 360 tablet 1    pantoprazole (PROTONIX) 40 MG tablet Take 1 tablet (40 mg total) by mouth Two (2) times a day. 180 tablet 1    pen needle, diabetic 32 gauge x 1/4 (6 mm) Ndle Use as directed to inject Lantus 100 each 3    rifAXIMin (XIFAXAN) 550 mg Tab Take 1 tablet (550 mg total) by mouth two (2) times a day. 60 tablet 11    secukinumab (COSENTYX  PEN) 150 mg/mL PnIj injection Inject the contents of 2 pens (150 mg total) under the skin weekly for 5 weeks. Loading dose. 10 mL 0    secukinumab (COSENTYX PEN, 2 PENS,) 150 mg/mL PnIj injection Inject 2 mL (300 mg total) under the skin every twenty-eight (28) days. Maintenance dose 2 mL 5    simvastatin (ZOCOR) 20 MG tablet Take 1 tablet (20 mg total) by mouth every evening. 90 tablet 1    spironolactone (ALDACTONE) 100 MG tablet Take 1 tablet (100 mg total) by mouth daily. 90 tablet 3    triamcinolone (KENALOG) 0.1 % ointment Apply the medication twice daily to affected areas of the skin until smooth. Then stop and re-start as soon as the skin changes come back. 60 g 5    zolpidem (AMBIEN) 5 MG tablet Take 1 tablet (5 mg total) by mouth nightly as needed for sleep.       No current facility-administered medications for this visit.        Changes to medications: Serafina reports no changes at this time.    No Known Allergies    Changes to allergies: No    SPECIALTY MEDICATION ADHERENCE     Cosentyx - 1 dose left  Medication Adherence    Patient reported X missed doses in the last month: 0  Specialty Medication: Cosentyx                            Specialty medication(s) dose(s) confirmed: Regimen is correct and unchanged.     Are there any concerns with adherence? No    Adherence counseling provided? Not needed    CLINICAL MANAGEMENT AND INTERVENTION      Clinical Benefit Assessment:    Do you feel the medicine is effective or helping your condition? Yes    Clinical Benefit counseling provided? Not needed    Adverse Effects Assessment:    Are you experiencing any side effects? No    Are you experiencing difficulty administering your medicine? No    Quality of Life Assessment:    Quality of Life    Rheumatology  Oncology  Dermatology  1. What impact has your specialty medication had on the symptoms of your skin condition (i.e. itchiness, soreness, stinging)?: Some  2. What impact has your specialty medication had on your comfort level with your skin?: Some  Cystic Fibrosis          How many days over the past month did your psoriasis  keep you from your normal activities? For example, brushing your teeth or getting up in the morning. 0    Have you discussed this with your provider? Not needed    Acute Infection Status:    Acute infections noted within Epic:  No active infections  Patient reported infection: None    Therapy Appropriateness:    Is therapy appropriate and patient progressing towards therapeutic goals? Yes, therapy is appropriate and should be continued    DISEASE/MEDICATION-SPECIFIC INFORMATION      For patients on injectable medications: Patient currently has 1 doses left.  Next injection is scheduled for 12/13.    Chronic Inflammatory Diseases: Have you experienced any flares in the last month? No  Has this been reported to your provider? No    PATIENT SPECIFIC NEEDS     Does the patient have any physical, cognitive, or cultural barriers? No    Is the patient high risk? No    Did  the patient require a clinical intervention? No    Does the patient require physician intervention or other additional services (i.e., nutrition, smoking cessation, social work)? No    SOCIAL DETERMINANTS OF HEALTH     At the [facility], we have learned that life circumstances - like trouble affording food, housing, utilities, or transportation can affect the health of many of our patients.   That is why we wanted to ask: are you currently experiencing any life circumstances that are negatively impacting your health and/or quality of life? Patient declined to answer    Social Determinants of Health     Financial Resource Strain: Medium Risk (09/23/2022)    Overall Financial Resource Strain (CARDIA)     Difficulty of Paying Living Expenses: Somewhat hard   Internet Connectivity: No Internet connectivity concern identified (09/17/2022)    Internet Connectivity     Do you have access to internet services: Yes     How do you connect to the internet: Personal Device at home     Is your internet connection strong enough for you to watch video on your device without major problems?: Yes     Do you have enough data to get through the month?: Yes     Does at least one of the devices have a camera that you can use for video chat?: Yes   Food Insecurity: Food Insecurity Present (09/23/2022)    Hunger Vital Sign     Worried About Running Out of Food in the Last Year: Sometimes true     Ran Out of Food in the Last Year: Sometimes true   Tobacco Use: Medium Risk (11/11/2022)    Patient History     Smoking Tobacco Use: Never     Smokeless Tobacco Use: Never     Passive Exposure: Current   Housing/Utilities: Low Risk  (09/23/2022)    Housing/Utilities     Within the past 12 months, have you ever stayed: outside, in a car, in a tent, in an overnight shelter, or temporarily in someone else's home (i.e. couch-surfing)?: No     Are you worried about losing your housing?: No     Within the past 12 months, have you been unable to get utilities (heat, electricity) when it was really needed?: No   Alcohol Use: Not At Risk (01/09/2021)    Alcohol Use     How often do you have a drink containing alcohol?: Monthly or less     How many drinks containing alcohol do you have on a typical day when you are drinking?: 1 - 2     How often do you have 5 or more drinks on one occasion?: Never   Transportation Needs: No Transportation Needs (09/23/2022)    PRAPARE - Transportation     Lack of Transportation (Medical): No     Lack of Transportation (Non-Medical): No   Substance Use: Low Risk  (01/09/2021)    Substance Use     Taken prescription drugs for non-medical reasons: Never     Taken illegal drugs: Never     Patient indicated they have taken drugs in the past year for non-medical reasons: Yes, [positive answer(s)]: Not on file   Health Literacy: Low Risk  (01/09/2021)    Health Literacy     : Never   Physical Activity: Insufficiently Active (01/09/2021)    Exercise Vital Sign     Days of Exercise per Week: 1 day     Minutes of Exercise per  Session: 40 min   Interpersonal Safety: Not at risk (09/17/2022)    Interpersonal Safety     Unsafe Where You Currently Live: No     Physically Hurt by Anyone: No     Abused by Anyone: No   Stress: Stress Concern Present (09/17/2022)    [employer] of Occupational Health - Occupational Stress Questionnaire     Feeling of Stress : Very much   Intimate Partner Violence: Not At Risk (01/09/2021)    Humiliation, Afraid, Rape, and Kick questionnaire     Fear of Current or Ex-Partner: No     Emotionally Abused: No     Physically Abused: No     Sexually Abused: No   Depression: Not at risk (07/12/2021)    PHQ-2     PHQ-2 Score: 0   Social Connections: Not on file       Would you be willing to receive help with any of the needs that you have identified today? Not applicable       SHIPPING     Specialty Medication(s) to be Shipped:   Inflammatory Disorders: Cosentyx    Other medication(s) to be shipped:  Xifaxan, Trulicity, spironolactone (requested)     Changes to insurance: No    Delivery Scheduled: Yes, Expected medication delivery date: 12/14.     Medication will be delivered via Same Day Courier to the confirmed prescription address in [facility].    The patient will receive a drug information handout for each medication shipped and additional FDA Medication Guides as required.  Verified that patient has previously received a Conservation officer, historic buildings and a Surveyor, mining.    The patient or caregiver noted above participated in the development of this care plan and knows that they can request review of or adjustments to the care plan at any time.      All of the patient's questions and concerns have been addressed.    Lanney Gins   [facility] Specialty Pharmacist

## 2022-11-11 NOTE — Unmapped (Signed)
Dermatology Note    Assessment and Plan:      Plaque psoriasis, trunk and extremities, ~30% BSA, improving on cosentyx, chronic not at treatment goal  Previously on Cosentyx starting in 09/2019 with improvement, then stopped for about 4-5 months and restarted in May 2022 without loading doses for another 6 months. No improvement on Ustekinumab [name]) from Jan 2023 to August 2023. No improvement on adalimumab (Humira) from Aug 2023 to October 2023.  - Patient states that when she was on Cosentyx as directed (without any missed doses) it was very effective  - Due to recent diagnosis of cirrhosis, will contact liver doctor Dr. Eudelia Bunch in coordinating care. Sent staff message to GI team. They are ok with cosentyx dosing.  - Has known osteoarthritis in knees, no other joint pain. Has been seen by a rheumatologist, who diagnosed her with osteoarthritis of bilateral knees, and does not think the patient has psoriatic arthritis.  - continue Cosentyx 300mg  monthly (starting following last loading dose)  - continue clobetasoL (TEMOVATE) 0.05 % ointment; Apply the medication twice daily to stubborn areas of the skin until smooth. Then stop and re-start as the skin changes come back.  - continue triamcinolone (KENALOG) 0.1 % ointment; Apply the medication twice daily to affected areas of the skin until smooth. Then stop and re-start as soon as the skin changes come back.    The patient was advised to call for an appointment should any new, changing, or symptomatic lesions develop.     RTC: Return in about 4 months (around 03/13/2023) for Recheck. or sooner as needed   _________________________________________________________________      Chief Complaint     Chief Complaint   Patient presents with    Psoriasis     Getting better with cosentyx has some itching but no new places       HPI     Ashley Briggs is a 56 y.o. female who presents as a returning patient (last seen 10/08/2022) for follow up of psoriasis. Patient just about to finish loading doses of cosentyx. She reports improvement of her psoriasis. She is using triamcinolone ointment as well with relief.    The patient denies any other new or changing lesions or areas of concern.     Pertinent Past Medical History     Problem List    None    Past Medical History, Family History, Social History, Medication List, Allergies, and Problem List were reviewed in the rooming section of Epic.     ROS: Other than symptoms mentioned in the HPI, no fevers, chills, or other skin complaints    Physical Examination     GENERAL: Well-appearing female in no acute distress, resting comfortably.  NEURO: Alert and oriented, answers questions appropriately  PSYCH: Normal mood and affect  EYES: lids clear, conjunctiva pink, no scleral icterus or other color change  RESP: No increased work of breathing  SKIN (Sun exposed Exam): Per patient request, examination of the face, neck, scalp, bilateral upper extremities, palms, and fingernails was performed    Well demarcated erythematous plaques with silvery scale on bilateral lower extremities, abdomen, chest with much improving erythema and fine silvery scale    All areas not commented on are within normal limits or unremarkable    (Approved Template 08/13/2020)

## 2022-11-12 MED ORDER — SPIRONOLACTONE 100 MG TABLET
ORAL_TABLET | Freq: Every day | ORAL | 2 refills | 30 days | Status: CP
Start: 2022-11-12 — End: 2023-02-10
  Filled 2022-11-14: qty 30, 30d supply, fill #0

## 2022-11-12 MED ORDER — PROMETHAZINE 12.5 MG TABLET
ORAL_TABLET | Freq: Three times a day (TID) | ORAL | 1 refills | 20 days | Status: CP | PRN
Start: 2022-11-12 — End: 2023-11-12
  Filled 2022-11-14: qty 60, 20d supply, fill #0

## 2022-11-12 NOTE — Unmapped (Signed)
The patient is requesting a medication refill

## 2022-11-14 MED FILL — XIFAXAN 550 MG TABLET: ORAL | 28 days supply | Qty: 56 | Fill #1

## 2022-11-14 MED FILL — COSENTYX PEN 300 MG/2 PENS (150 MG/ML) SUBCUTANEOUS: 28 days supply | Qty: 2 | Fill #1

## 2022-11-16 ENCOUNTER — Other Ambulatory Visit: Payer: Self-pay | Admitting: Psychiatry

## 2022-11-20 DIAGNOSIS — E113212 Type 2 diabetes mellitus with mild nonproliferative diabetic retinopathy with macular edema, left eye: Principal | ICD-10-CM

## 2022-11-20 DIAGNOSIS — Z794 Long term (current) use of insulin: Principal | ICD-10-CM

## 2022-11-20 MED ORDER — TRULICITY 3 MG/0.5 ML SUBCUTANEOUS PEN INJECTOR
SUBCUTANEOUS | 6 refills | 28.00000 days
Start: 2022-11-20 — End: ?

## 2022-11-21 NOTE — Unmapped (Signed)
Medication should be filled by endocrinology as they are managing this for her

## 2022-11-21 NOTE — Unmapped (Signed)
Attempted to contact patient. No answer. 

## 2022-11-21 NOTE — Unmapped (Signed)
Patient sent a mychart message informing her she is due for follow up with dr. Amalia Hailey and to call to schedule. Once appointment is scheduled, a bridge prescription will be provided. No further concerns at this moment.

## 2022-11-25 DIAGNOSIS — Z794 Long term (current) use of insulin: Principal | ICD-10-CM

## 2022-11-25 DIAGNOSIS — E113212 Type 2 diabetes mellitus with mild nonproliferative diabetic retinopathy with macular edema, left eye: Principal | ICD-10-CM

## 2022-11-25 MED ORDER — CANAGLIFLOZIN 300 MG TABLET
ORAL_TABLET | Freq: Every day | ORAL | 1 refills | 30 days
Start: 2022-11-25 — End: ?

## 2022-11-25 MED ORDER — INSULIN GLARGINE (U-100) 100 UNIT/ML (3 ML) SUBCUTANEOUS PEN
Freq: Every evening | SUBCUTANEOUS | 1 refills | 90 days | Status: CP
Start: 2022-11-25 — End: 2023-11-25
  Filled 2022-12-11: qty 60, 90d supply, fill #0

## 2022-11-26 MED ORDER — CANAGLIFLOZIN 300 MG TABLET
ORAL_TABLET | Freq: Every day | ORAL | 0 refills | 90 days | Status: CP
Start: 2022-11-26 — End: 2023-11-26
  Filled 2022-12-11: qty 90, 90d supply, fill #0

## 2022-11-26 NOTE — Unmapped (Signed)
Pt has appointment scheduled for 02/03/23

## 2022-12-02 ENCOUNTER — Ambulatory Visit: Admit: 2022-12-02 | Discharge: 2022-12-03 | Payer: PRIVATE HEALTH INSURANCE

## 2022-12-02 DIAGNOSIS — H524 Presbyopia: Principal | ICD-10-CM

## 2022-12-02 DIAGNOSIS — E113212 Type 2 diabetes mellitus with mild nonproliferative diabetic retinopathy with macular edema, left eye: Principal | ICD-10-CM

## 2022-12-02 DIAGNOSIS — E1136 Type 2 diabetes mellitus with diabetic cataract: Principal | ICD-10-CM

## 2022-12-02 DIAGNOSIS — H5213 Myopia, bilateral: Principal | ICD-10-CM

## 2022-12-02 DIAGNOSIS — E113299 Type 2 diabetes mellitus with mild nonproliferative diabetic retinopathy without macular edema, unspecified eye: Principal | ICD-10-CM

## 2022-12-02 DIAGNOSIS — H52203 Unspecified astigmatism, bilateral: Principal | ICD-10-CM

## 2022-12-02 DIAGNOSIS — E113211 Type 2 diabetes mellitus with mild nonproliferative diabetic retinopathy with macular edema, right eye: Secondary | ICD-10-CM | POA: Insufficient documentation

## 2022-12-02 LAB — HM DIABETES EYE EXAM

## 2022-12-02 NOTE — Unmapped (Signed)
This is a 57 YO Female pt seen in consultation at the request of  Benison Pap, DO  And Dr Beckey Downing for evaluation of Chronic Blepharitis both eyes, dilated eye exam for   DME L eye.    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  R eye has mid BDR        Patient says near vision seems to be worsening and wants to update her Rx glasses  Patient also c/o occaisionally tearing and itching both eyes        She was followed by Dr Beckey Downing for DME and was receiving regular   Avastin injections L eye until June 2022, then did not return until today.    HgbA1C  6.4 % (A) 01/13/2022   now 7.8% (11/04/22)     PMH:  PFD (pelvic floor dysfunction)    Hemorrhage of rectum and anus    Hyperlipidemia    Hypertension    Morbid obesity (CMS-HCC)    H/O diabetic gastroparesis    Coronary artery disease involving native heart without angina pectoris    Hepatic steatosis    Esophageal varices in cirrhosis (CMS-HCC)    Hepatic cirrhosis (CMS-HCC)    Secondary esophageal varices without bleeding (CMS-HCC)    Hepatic encephalopathy (CMS-HCC)    Hx of psoriasis    Calcified cerebral meningioma (CMS-HCC)    Right upper quadrant abdominal pain    Other ascites    Nausea    Portal hypertension (CMS-HCC)    Other chronic pain    Current moderate episode of major depressive disorder (CMS-HCC)    Other insomnia      A/P:  Diagnoses and all orders for this visit:    Myopia of both eyes with astigmatism and presbyopia    MRx finalized and given to pt     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      RTC  6 months, needs MAC  OCT next visit    I personally spent 30 minutes face-to-face and non-face-to-face in the care of this patient,   which includes all pre, intra, and post visit time on the date of service.    Penelope Galas MD
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
## 2022-12-07 DIAGNOSIS — R1013 Epigastric pain: Principal | ICD-10-CM

## 2022-12-07 MED ORDER — PANTOPRAZOLE 40 MG TABLET,DELAYED RELEASE
ORAL_TABLET | Freq: Two times a day (BID) | ORAL | 1 refills | 90 days
Start: 2022-12-07 — End: ?

## 2022-12-08 ENCOUNTER — Ambulatory Visit (INDEPENDENT_AMBULATORY_CARE_PROVIDER_SITE_OTHER): Payer: Medicaid Other | Admitting: Psychiatry

## 2022-12-08 ENCOUNTER — Encounter: Payer: Self-pay | Admitting: Psychiatry

## 2022-12-08 VITALS — BP 94/59 | HR 81 | Temp 98.3°F | Ht 64.0 in | Wt 217.8 lb

## 2022-12-08 DIAGNOSIS — F4010 Social phobia, unspecified: Secondary | ICD-10-CM | POA: Diagnosis not present

## 2022-12-08 DIAGNOSIS — F331 Major depressive disorder, recurrent, moderate: Secondary | ICD-10-CM

## 2022-12-08 DIAGNOSIS — G4700 Insomnia, unspecified: Secondary | ICD-10-CM | POA: Diagnosis not present

## 2022-12-08 MED ORDER — BLOOD SUGAR DIAGNOSTIC STRIPS
ORAL_STRIP | Freq: Every day | 1 refills | 0 days | Status: CP
Start: 2022-12-08 — End: 2023-12-08

## 2022-12-08 MED ORDER — PANTOPRAZOLE 40 MG TABLET,DELAYED RELEASE
ORAL_TABLET | Freq: Two times a day (BID) | ORAL | 1 refills | 90 days | Status: CP
Start: 2022-12-08 — End: 2023-12-08
  Filled 2022-12-11: qty 180, 90d supply, fill #0

## 2022-12-08 MED ORDER — BLOOD-GLUCOSE METER KIT WRAPPER
0 refills | 0 days | Status: CP
Start: 2022-12-08 — End: 2023-12-08

## 2022-12-08 MED ORDER — LANCETS 30 GAUGE
1 refills | 0 days | Status: CP
Start: 2022-12-08 — End: 2023-12-09
  Filled 2022-12-15: qty 100, 90d supply, fill #0

## 2022-12-08 NOTE — Unmapped (Signed)
[facility] Refill Coordination Note    Ashley Briggs, [place]: 07-20-1966  Phone: There are no phone numbers on file.      All above HIPAA information was verified with patient.         12/06/2022     9:17 PM   Specialty Rx Medication Refill Questionnaire   Which Medications would you like refilled and shipped? Pantoprazole 40mg  1 week,Spironolactone  100mg  1 week. Spironolactone  359 mg 1 week. Lantus 66 units 2 weeks. Trulicity I am out. Cosyntex  I am out.   Please list all current allergies: None   Have you missed any doses in the last 30 days? No   Have you had any changes to your medication(s) since your last refill? No   How many days remaining of each medication do you have at home? At least 1 to 2 weeks. Or I am out   If receiving an injectable medication, next injection date is 12/25/2022   Have you experienced any side effects in the last 30 days? No   Please enter the full address (street address, city, state, zip code) where you would like your medication(s) to be delivered to. [address], [place], [place]  [address]   Please specify on which day you would like your medication(s) to arrive. Note: if you need your medication(s) within 3 days, please call the pharmacy to schedule your order at [phone]  12/11/2022   Has your insurance changed since your last refill? No   Would you like a pharmacist to call you to discuss your medication(s)? No   Do you require a signature for your package? (Note: if we are billing Medicare Part B or your order contains a controlled substance, we will require a signature) No         Completed refill call assessment today to schedule patient's medication shipment from the [facility] [phone]).  All relevant notes have been reviewed.       Confirmed patient received a Conservation officer, historic buildings and a Surveyor, mining with first shipment. The patient will receive a drug information handout for each medication shipped and additional FDA Medication Guides as required.         REFERRAL TO PHARMACIST     Referral to the pharmacist: Not needed      [facility]     Shipping address confirmed in Epic.     Delivery Scheduled: Yes, Expected medication delivery date: 12/11/22.     Medication will be delivered via Same Day Courier to the prescription address in Epic WAM.    Ricci Barker   [facility] Specialty Technician

## 2022-12-08 NOTE — Patient Instructions (Signed)
Continue bupropion 150 mg daily  Continue citalopram 40 mg daily  Continue Ambien 5 mg at night as needed for insomnia- 2 refills left Referred to therapy (wait list) Referred for evaluation of sleep apnea Next appointment: 2.19 at 2 PM

## 2022-12-09 NOTE — Unmapped (Unsigned)
Endocrinology Follow-up      Ashley Briggs is a 57 y.o. female seen in Endocrinology consultation follow-up on 12/09/22 for No chief complaint on file.    Patient was last seen by me on 03/12/22 for type 2 diabetes.     Medication regimen consists of Trulicity 3 mg weekly, Invokana 300 mg before breakfast, metformin 1000 mg twice daily with meals, and Lantus 66 units at bedtime.     ROS:    Lab Results   Component Value Date    A1C 7.8 (A) 11/04/2022    A1C 9.8 (A) 09/02/2022    A1C 6.4 (A) 01/13/2022    GLU 94 10/07/2022    GLU 72 09/30/2022    GLU 78 09/29/2022        Referring Provider:       Primary Provider: Rosine Beat, Benison Pap, DO      Assessment/Plan:     There are no diagnoses linked to this encounter.    No follow-ups on file.    There are no Patient Instructions on file for this visit.      Subjective:     History of Present Illness: See problem oriented charting.    Past Medical History:   Diagnosis Date    Anxiety     Arthritis     Blepharitis     Cirrhosis (CMS-HCC)     Diabetes mellitus (CMS-HCC)     Endometrial cancer (CMS-HCC) 2007    Gastroparesis     Heart disease     High cholesterol     Hypertension     Liver disease     Major depression     Non-alcoholic fatty liver disease     NPDR (nonproliferative diabetic retinopathy) (CMS-HCC)     Right eye    Psoriasis     Ptosis of eyelid, left     Left upper eyelid    Reflux         Past Surgical History:   Procedure Laterality Date    CHOLECYSTECTOMY      HYSTERECTOMY      endometrial cancer    HYSTERECTOMY      OOPHORECTOMY      PR ANAL PRESSURE RECORD Left 03/31/2013    Procedure: ANORECTAL MANOMETRY;  Surgeon: None None;  [facility];  Service: Gastroenterology    PR BREATH HYDROGEN TEST N/A 03/31/2013    Procedure: BREATH HYDROGEN TEST;  Surgeon: None None;  [facility]: [facility];  Service: Gastroenterology    PR COLSC FLX W/RMVL OF TUMOR POLYP LESION SNARE TQ N/A 09/06/2020    Procedure: COLONOSCOPY FLEX; W/REMOV TUMOR/LES BY SNARE;  Surgeon: Chriss Driver, MD;  Location: [facility];  Service: Gastroenterology    PR UPPER GI ENDOSCOPY,BIOPSY N/A 04/15/2013    Procedure: UGI ENDOSCOPY; WITH BIOPSY, SINGLE OR MULTIPLE;  Surgeon: Vickii Chafe, MD;  Location: [facility];  Service: Gastroenterology    PR UPPER GI ENDOSCOPY,BIOPSY N/A 09/06/2020    Procedure: UGI ENDOSCOPY; WITH BIOPSY, SINGLE OR MULTIPLE;  Surgeon: Chriss Driver, MD;  [facility]: [facility];  Service: Gastroenterology    PR UPPER GI ENDOSCOPY,BIOPSY N/A 05/05/2022    Procedure: UGI ENDOSCOPY; WITH BIOPSY, SINGLE OR MULTIPLE;  Surgeon: Chriss Driver, MD;  [facility]: [facility];  Service: Gastroenterology       Current Outpatient Medications   Medication Sig Dispense Refill    acetaminophen (TYLENOL) 325 MG tablet Take 2  tablets (650 mg total) by mouth two (2) times a day as needed for pain.      aspirin (ECOTRIN) 81 MG tablet Take 1 tablet (81 mg total) by mouth daily. 90 tablet 3    blood sugar diagnostic Strp by Other route daily before breakfast. 100 strip 1    blood-glucose meter kit Use as directed by provider. 1 each 0    buPROPion (WELLBUTRIN XL) 150 MG 24 hr tablet Take 1 tablet (150 mg total) by mouth daily.      canagliflozin (INVOKANA) 300 mg Tab tablet Take 1 tablet (300 mg total) by mouth daily before breakfast. NEED APPOINTMENT FOR FURTHER REFILLS. 90 tablet 0    carvedilol (COREG) 6.25 MG tablet Take 1 tablet (6.25 mg total) by mouth Two (2) times a day. Start with 1 tablet nightly, if tolerating well increase to twice daily 180 tablet 3    cholestyramine-aspartame (CHOLESTYRAMINE LIGHT) 4 gram PwPk Mix 1 packet as directed and take by mouth daily. 60 packet 5    citalopram (CELEXA) 40 MG tablet Take 1 tablet (40 mg total) by mouth daily.      clobetasol (TEMOVATE) 0.05 % ointment Apply the medication twice daily to stubborn areas of the skin until smooth. Then stop and re-start as the skin changes come back. 60 g 5    clonazePAM (KLONOPIN) 0.5 MG tablet Take 1 tablet (0.5 mg total) by mouth nightly as needed for anxiety and sleep. May repeat dose once. 40 tablet 1    cyclobenzaprine (FLEXERIL) 5 MG tablet Take 1-2 tablets by mouth every 8 hours as needed muscle pain or spasm. 30 tablet 1    dulaglutide (TRULICITY) 1.5 mg/0.5 mL PnIj Inject the contents of 2 syringes (3 mg total) under the skin every seven (7) days. 4 mL 6    ferrous sulfate (SLOW FE) 142 mg (45 mg iron) TbER Take 1 tablet (142 mg) by mouth two (2) times a day. 60 tablet 3    furosemide (LASIX) 40 MG tablet Take 1 tablet (40 mg total) by mouth daily. 90 tablet 3    insulin glargine (LANTUS SOLOSTAR U-100 INSULIN) 100 unit/mL (3 mL) injection pen Inject 0.66 mL (66 Units total) under the skin nightly. 60 mL 1    lactulose 10 gram/15 mL solution Take 30 mL (20 g total) by mouth Three (3) times a day. 240 mL 0    lancets 30 gauge Misc Use as directed to test once daily. 100 each 1    metFORMIN (GLUCOPHAGE-XR) 500 MG 24 hr tablet Take 2 tablets (1,000 mg total) by mouth in the morning and 2 tablets (1,000 mg total) in the evening. Take with meals. 360 tablet 1    pantoprazole (PROTONIX) 40 MG tablet Take 1 tablet (40 mg total) by mouth two (2) times a day. 180 tablet 1    pen needle, diabetic 32 gauge x 1/4 (6 mm) Ndle Use as directed to inject Lantus 100 each 3    promethazine (PHENERGAN) 12.5 MG tablet Take 1 tablet (12.5 mg total) by mouth every eight (8) hours as needed for nausea. 60 tablet 1    rifAXIMin (XIFAXAN) 550 mg Tab Take 1 tablet (550 mg total) by mouth two (2) times a day. 60 tablet 11    secukinumab (COSENTYX PEN) 150 mg/mL PnIj injection Inject the contents of 2 pens (150 mg total) under the skin weekly for 5 weeks. Loading dose. 10 mL 0    secukinumab (COSENTYX PEN, 2 PENS,)  150 mg/mL PnIj injection Inject the contents of 2 pens (300 mg total) under the skin every twenty-eight (28) days. Maintenance dose 2 mL 5    simvastatin (ZOCOR) 20 MG tablet Take 1 tablet (20 mg total) by mouth every evening. 90 tablet 1    spironolactone (ALDACTONE) 100 MG tablet Take 1 tablet (100 mg total) by mouth daily. 30 tablet 2    triamcinolone (KENALOG) 0.1 % ointment Apply the medication twice daily to affected areas of the skin until smooth. Then stop and re-start as soon as the skin changes come back. 60 g 5    zolpidem (AMBIEN) 5 MG tablet Take 1 tablet (5 mg total) by mouth nightly as needed for sleep.       No current facility-administered medications for this visit.        No Known Allergies    Family History   Problem Relation Age of Onset    Diabetes Mother     Cancer Mother     No Known Problems Father     No Known Problems Sister     Diabetes Maternal Grandmother     Hypertension Maternal Grandmother     Cancer Maternal Grandfather     Diabetes Maternal Grandfather     No Known Problems Paternal Grandmother     Skin cancer Paternal Grandfather     Stroke Paternal Grandfather     No Known Problems Brother     No Known Problems Maternal Aunt     No Known Problems Maternal Uncle     No Known Problems Paternal Aunt     No Known Problems Paternal Uncle     No Known Problems Other     Melanoma Neg Hx     Basal cell carcinoma Neg Hx     Squamous cell carcinoma Neg Hx     Substance Abuse Disorder Neg Hx     Alcohol abuse Neg Hx     Drug abuse Neg Hx     Mental illness Neg Hx     Amblyopia Neg Hx     Blindness Neg Hx     Cataracts Neg Hx     Glaucoma Neg Hx     Macular degeneration Neg Hx     Retinal detachment Neg Hx     Strabismus Neg Hx     Thyroid disease Neg Hx     Breast cancer Neg Hx            Review of Systems -  negative except as noted in HPI.     Objective:     Physical Exam:  LMP 08/03/2006   General appearance - alert, well appearing, and in no distress.  Eyes - No lid lag or stare, no proptosis, EOM's intact.  Mouth - mucous membranes moist.  Neck - supple, thyroid normal in size without nodules or tenderness.  Lymphatics - no cervical or supraclavicular adenopathy appreciated.  Chest - clear, good excursion.  Heart - normal rate, regular rhythm.  Abdomen - non-distended, soft.   Neurological - no hand tremors. Normal gait.  Extremities - extremities warm. No lower extremity edema.  Skin - warm, dry, no visible rashes.  Psych - Normal mood, appropriate affect.  Muskuloskeletal - No kyphosis or spine tenderness.      Lab Review:  I've reviewed the patient's most recent pertinent labs in the electronic record and provided records.  No results found for: TSH, T3TOTAL, T4TOTAL, FREET4, TPERXAB, THYROG2NDGEN, THYAC  No results found for: TSH, FREET4, T4TOTAL, FREET3, T3TOTAL, THYROG2NDGEN, THYIAINT, LABTHYR, THYROIDAB, [place]    Lab Results   Component Value Date    CALCIUM 9.7 10/07/2022    CALCIUM 8.9 09/30/2022    CALCIUM 8.7 09/29/2022    ALBUMIN 3.0 (L) 10/07/2022    CREATININE 0.69 10/07/2022       Lab Results   Component Value Date    A1C 7.8 (A) 11/04/2022    GLU 94 10/07/2022    CREATININE 0.69 10/07/2022    ALBCRERAT  02/11/2022      Comment:      Unable to calculate.    ALBQTUR <0.3 02/11/2022    CHOL 122 09/12/2021    HDL 39 (L) 09/12/2021    NONHDL 83 09/12/2021    LDL 62 09/12/2021    TRIG 161 09/12/2021          Radiology:    Thyroid US:  No results found for this or any previous visit.     No results found for this or any previous visit.     No results found for this or any previous visit.       DEXA:    No results found for this or any previous visit.             Other Medical Data :       I've personally reviewed and summarized records in EPIC/Media and via CareEverywhere as well as medication, allergies, past medical, social, and family history.     I attest that I, Mamie Nick, personally documented this note while acting as scribe for Marvia Pickles, MD.      Mamie Nick, Scribe.  12/10/2022     The documentation recorded by the scribe accurately reflects the service I personally performed and the decisions made by me.    Marvia Pickles, MD

## 2022-12-10 MED ORDER — LANTUS SOLOSTAR U-100 INSULIN 100 UNIT/ML (3 ML) SUBCUTANEOUS PEN
Freq: Every evening | SUBCUTANEOUS | 1 refills | 90 days | Status: CN
Start: 2022-12-10 — End: 2023-12-10

## 2022-12-11 MED FILL — CARVEDILOL 6.25 MG TABLET: ORAL | 90 days supply | Qty: 180 | Fill #2

## 2022-12-11 MED FILL — CLOBETASOL 0.05 % TOPICAL OINTMENT: 14 days supply | Qty: 60 | Fill #2

## 2022-12-11 MED FILL — TRULICITY 1.5 MG/0.5 ML SUBCUTANEOUS PEN INJECTOR: SUBCUTANEOUS | 28 days supply | Qty: 4 | Fill #2

## 2022-12-11 MED FILL — CHOLESTYRAMINE LIGHT 4 GRAM POWDER FOR SUSP IN A PACKET: ORAL | 60 days supply | Qty: 60 | Fill #1

## 2022-12-11 MED FILL — SPIRONOLACTONE 100 MG TABLET: ORAL | 60 days supply | Qty: 60 | Fill #1

## 2022-12-11 MED FILL — TRIAMCINOLONE ACETONIDE 0.1 % TOPICAL OINTMENT: 28 days supply | Qty: 60 | Fill #2

## 2022-12-11 MED FILL — COSENTYX PEN 300 MG/2 PENS (150 MG/ML) SUBCUTANEOUS: SUBCUTANEOUS | 28 days supply | Qty: 2 | Fill #0

## 2022-12-11 NOTE — Unmapped (Signed)
[facility] Refill Coordination Note    Specialty Medication(s) to be Shipped:   Inflammatory Disorders: Xifaxan    Other medication(s) to be shipped: No additional medications requested for fill at this time     Dakotah Weeda, DOB: 1966/05/10  Phone: There are no phone numbers on file.      All above HIPAA information was verified with patient.     Was a Nurse, learning disability used for this call? No    Completed refill call assessment today to schedule patient's medication shipment from the [facility] [phone]).  All relevant notes have been reviewed.     Specialty medication(s) and dose(s) confirmed: Regimen is correct and unchanged.   Changes to medications: Claris reports no changes at this time.  Changes to insurance: No  New side effects reported not previously addressed with a pharmacist or physician: None reported  Questions for the pharmacist: No    Confirmed patient received a Conservation officer, historic buildings and a Surveyor, mining with first shipment. The patient will receive a drug information handout for each medication shipped and additional FDA Medication Guides as required.       DISEASE/MEDICATION-SPECIFIC INFORMATION        N/A    SPECIALTY MEDICATION ADHERENCE     Medication Adherence    Patient reported X missed doses in the last month: 0  Specialty Medication: XIFAXAN 550 mg Tab (rifAXIMin)  Patient is on additional specialty medications: No  Patient is on more than two specialty medications: No  Any gaps in refill history greater than 2 weeks in the last 3 months: no  Demonstrates understanding of importance of adherence: yes  Informant: patient  Reliability of informant: reliable  Provider-estimated medication adherence level: good  Patient is at risk for Non-Adherence: No  Reasons for non-adherence: no problems identified                                Were doses missed due to medication being on hold? No    XIFAXAN 550 mg Tab (rifAXIMin)  : 5 days of medicine on hand REFERRAL TO PHARMACIST     Referral to the pharmacist: Not needed      [facility]     Shipping address confirmed in Epic.     Delivery Scheduled: Yes, Expected medication delivery date: 12/17/22.     Medication will be delivered via UPS to the prescription address in Epic WAM.    Mariaelena Cade' W Danae Chen Shared [facility] Specialty Technician

## 2022-12-12 ENCOUNTER — Encounter: Payer: Medicaid Other | Admitting: Internal Medicine

## 2022-12-12 ENCOUNTER — Encounter: Payer: Self-pay | Admitting: Internal Medicine

## 2022-12-12 NOTE — Progress Notes (Signed)
This encounter was created in error - please disregard.

## 2022-12-13 MED ORDER — ZOLPIDEM 5 MG TABLET
Freq: Every evening | ORAL | 0 refills | 0 days | PRN
Start: 2022-12-13 — End: ?

## 2022-12-13 MED ORDER — ACETAMINOPHEN 325 MG TABLET
Freq: Two times a day (BID) | ORAL | 0 refills | 0 days | PRN
Start: 2022-12-13 — End: ?

## 2022-12-15 MED ORDER — BLOOD SUGAR DIAGNOSTIC STRIPS
ORAL_STRIP | Freq: Two times a day (BID) | 1 refills | 0 days | Status: CP
Start: 2022-12-15 — End: 2023-12-15

## 2022-12-15 MED ORDER — PEN NEEDLE, DIABETIC 32 GAUGE X 1/4" (6 MM)
Freq: Every evening | SUBCUTANEOUS | 1 refills | 100 days | Status: CP
Start: 2022-12-15 — End: 2023-12-15

## 2022-12-15 MED FILL — XIFAXAN 550 MG TABLET: ORAL | 28 days supply | Qty: 56 | Fill #2

## 2022-12-15 MED FILL — ACCU-CHEK GUIDE TEST STRIPS: 90 days supply | Qty: 100 | Fill #0

## 2022-12-16 MED ORDER — ZOLPIDEM 5 MG TABLET
ORAL_TABLET | Freq: Every evening | ORAL | 1 refills | 90 days | Status: CP | PRN
Start: 2022-12-16 — End: 2023-12-16

## 2022-12-16 MED ORDER — ACETAMINOPHEN 325 MG TABLET
ORAL_TABLET | Freq: Two times a day (BID) | ORAL | 1 refills | 45 days | Status: CP | PRN
Start: 2022-12-16 — End: 2023-12-16
  Filled 2022-12-15: qty 90, 90d supply, fill #1

## 2022-12-16 NOTE — Unmapped (Signed)
Patient is requesting the following refill  Requested Prescriptions     Pending Prescriptions Disp Refills    zolpidem (AMBIEN) 5 MG tablet 90 tablet 1     Sig: Take 1 tablet (5 mg total) by mouth nightly as needed for sleep.    acetaminophen (TYLENOL) 325 MG tablet 180 tablet 1     Sig: Take 2 tablets (650 mg total) by mouth two (2) times a day as needed for pain.       Last refill given on: historical med.     Recent Visits  Date Type Provider Dept   11/04/22 Office Visit Mangel, Benison Pap, DO [place] [facility] [address] [facility]   10/16/22 Office Visit Mangel, Benison Pap, DO [place] [facility] [address] [facility]   09/17/22 Office Visit Mangel, Benison Pap, DO [place] [facility] [address] [facility]   09/02/22 Office Visit Johnn Hai, Loleta Rose, FNP [place] [facility] [address] [facility]   08/06/22 Office Visit Johnn Hai, Loleta Rose, FNP [place] [facility] [address] [facility]   03/06/22 Office Visit Farrug, Emogene Morgan, NP [place] [facility] [address] [facility]   02/11/22 Office Visit Nile Dear, Emogene Morgan, NP [place] [facility]   01/13/22 Office Visit Nile Dear, Emogene Morgan, NP [place] [facility]   Showing recent visits within past 365 days with a meds authorizing provider and meeting all other requirements  Future Appointments  Date Type Provider Dept   02/06/23 Appointment Mangel, Benison Pap, DO  [facility] [address] [facility]   Showing future appointments within next 365 days with a meds authorizing provider and meeting all other requirements       Opioid Monitoring   Urine Tox Screen Last Drug Screen Date: Not Found  Opiate Confirmation Test Last Drug Screen Date: Not Found  Last Opioid Dispensed Provider: Rica Koyanagi, MD  Prescribed MEDD : 0  Last PDMP Review: 09/30/2022 12:27 PM  Last Opioid Pain Agreement Signed Date: Not Found  Last Non Opioid Controlled Substance Pain Agreement Signed Date: Not Found    Naloxone Ordered: Not Found  Last OV: 11/04/2022

## 2022-12-18 NOTE — Unmapped (Signed)
Addended by: Barbaraann Boys on: 12/17/2022 05:46 PM     Modules accepted: Orders

## 2022-12-19 ENCOUNTER — Emergency Department
Admit: 2022-12-19 | Discharge: 2022-12-20 | Disposition: A | Discharge status: Home / Self Care | Payer: PRIVATE HEALTH INSURANCE

## 2022-12-19 ENCOUNTER — Ambulatory Visit
Admit: 2022-12-19 | Discharge: 2022-12-20 | Disposition: A | Discharge status: Home / Self Care | Payer: PRIVATE HEALTH INSURANCE

## 2022-12-19 DIAGNOSIS — W19XXXA Unspecified fall, initial encounter: Principal | ICD-10-CM

## 2022-12-19 LAB — COMPREHENSIVE METABOLIC PANEL
ALBUMIN: 3.4 g/dL (ref 3.4–5.0)
ALKALINE PHOSPHATASE: 196 U/L — ABNORMAL HIGH (ref 46–116)
ALT (SGPT): 36 U/L (ref 10–49)
ANION GAP: 8 mmol/L (ref 5–14)
AST (SGOT): 71 U/L — ABNORMAL HIGH (ref <=34)
BILIRUBIN TOTAL: 2.7 mg/dL — ABNORMAL HIGH (ref 0.3–1.2)
BLOOD UREA NITROGEN: 13 mg/dL (ref 9–23)
BUN / CREAT RATIO: 21
CALCIUM: 10.1 mg/dL (ref 8.7–10.4)
CHLORIDE: 100 mmol/L (ref 98–107)
CO2: 28.7 mmol/L (ref 20.0–31.0)
CREATININE: 0.61 mg/dL
EGFR CKD-EPI (2021) FEMALE: >90 mL/min/{1.73_m2} (ref >=60)
GLUCOSE RANDOM: 151 mg/dL (ref 70–179)
POTASSIUM: 3.6 mmol/L (ref 3.4–4.8)
PROTEIN TOTAL: 8.6 g/dL — ABNORMAL HIGH (ref 5.7–8.2)
SODIUM: 137 mmol/L (ref 135–145)

## 2022-12-19 LAB — CBC W/ AUTO DIFF
BASOPHILS ABSOLUTE COUNT: 0 10*9/L (ref 0.0–0.1)
BASOPHILS RELATIVE PERCENT: 0.9 %
EOSINOPHILS ABSOLUTE COUNT: 0.2 10*9/L (ref 0.0–0.5)
EOSINOPHILS RELATIVE PERCENT: 6.1 %
HEMATOCRIT: 35.8 % (ref 34.0–44.0)
HEMOGLOBIN: 11.1 g/dL — ABNORMAL LOW (ref 11.3–14.9)
LYMPHOCYTES ABSOLUTE COUNT: 1.3 10*9/L (ref 1.1–3.6)
LYMPHOCYTES RELATIVE PERCENT: 34.7 %
MEAN CORPUSCULAR HEMOGLOBIN CONC: 31.1 g/dL — ABNORMAL LOW (ref 32.0–36.0)
MEAN CORPUSCULAR HEMOGLOBIN: 22.5 pg — ABNORMAL LOW (ref 25.9–32.4)
MEAN CORPUSCULAR VOLUME: 72.3 fL — ABNORMAL LOW (ref 77.6–95.7)
MEAN PLATELET VOLUME: 8.4 fL (ref 6.8–10.7)
MONOCYTES ABSOLUTE COUNT: 0.4 10*9/L (ref 0.3–0.8)
MONOCYTES RELATIVE PERCENT: 10.3 %
NEUTROPHILS ABSOLUTE COUNT: 1.8 10*9/L (ref 1.8–7.8)
NEUTROPHILS RELATIVE PERCENT: 48 %
NUCLEATED RED BLOOD CELLS: 0 /100{WBCs} (ref <=4)
PLATELET COUNT: 158 10*9/L (ref 150–450)
RED BLOOD CELL COUNT: 4.96 10*12/L (ref 3.95–5.13)
RED CELL DISTRIBUTION WIDTH: 18.8 % — ABNORMAL HIGH (ref 12.2–15.2)
WBC ADJUSTED: 3.8 10*9/L (ref 3.6–11.2)

## 2022-12-19 LAB — LIPASE: LIPASE: 79 U/L — ABNORMAL HIGH (ref 12–53)

## 2022-12-20 MED ADMIN — iohexol (OMNIPAQUE) 350 mg iodine/mL solution 100 mL: 100 mL | INTRAVENOUS | @ 01:00:00 | Stop: 2022-12-19

## 2022-12-20 MED ADMIN — lidocaine 4 % patch 1 patch: 1 | TRANSDERMAL | @ 01:00:00 | Stop: 2022-12-19

## 2022-12-20 NOTE — Unmapped (Signed)
Pt fell outside in the grass ditch walking the dog 2 days ago. Pain still to right side.  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

## 2022-12-20 NOTE — Unmapped (Addendum)
[facility]  Emergency Department Provider Note        History     Chief Complaint  Fall      HPI   Ashley Briggs is a 57 y.o. female with a past medical history of hepatosplenomegaly, cirrhosis of the liver, DM, endometrial cancer who presents with a fall 2 days ago.  She was walking her dog which pulled on the leash.  She lost her balance and fell onto the right side.  She has pain in the right rib area as well as right upper abdomen.  There is pain when she tries to take a deep breath or cough.  She worries not only about a rib injury but also for internal organ injury.  Otherwise she denies any new symptoms from her baseline.  No head injury or neck trauma.       Past Medical History:   Diagnosis Date    Anxiety     Arthritis     Blepharitis     Cirrhosis (CMS-HCC)     Diabetes mellitus (CMS-HCC)     Endometrial cancer (CMS-HCC) 2007    Gastroparesis     Heart disease     High cholesterol     Hypertension     Liver disease     Major depression     Non-alcoholic fatty liver disease     NPDR (nonproliferative diabetic retinopathy) (CMS-HCC)     Right eye    Psoriasis     Ptosis of eyelid, left     Left upper eyelid    Reflux        Patient Active Problem List   Diagnosis    PFD (pelvic floor dysfunction)    Type 2 diabetes mellitus, with long-term current use of insulin (CMS-HCC)    Hemorrhage of rectum and anus    Hyperlipidemia    Hypertension    Morbid obesity (CMS-HCC)    Dry eye syndrome, bilateral    Angular blepharoconjunctivitis of both eyes    Cataract associated with type 2 diabetes mellitus (CMS-HCC)    H/O diabetic gastroparesis    Retinopathy of left eye, background, proliferative    Coronary artery disease involving native heart without angina pectoris    Hepatic steatosis    Diabetes mellitus (CMS-HCC)    NPDR (nonproliferative diabetic retinopathy) (CMS-HCC)    Ptosis of eyelid, left    Esophageal varices in cirrhosis (CMS-HCC)    Hepatic cirrhosis (CMS-HCC)    Secondary esophageal varices without bleeding (CMS-HCC)    Hepatic encephalopathy (CMS-HCC)    Hx of psoriasis    Calcified cerebral meningioma (CMS-HCC)    Right upper quadrant abdominal pain    Other ascites    Nausea    Portal hypertension (CMS-HCC)    Other chronic pain    Current moderate episode of major depressive disorder (CMS-HCC)    Other insomnia    Myopia of both eyes with astigmatism and presbyopia    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 type 2 diabetes mellitus (CMS-HCC)       Past Surgical History:   Procedure Laterality Date    CHOLECYSTECTOMY      HYSTERECTOMY      endometrial cancer    HYSTERECTOMY      OOPHORECTOMY      PR ANAL PRESSURE RECORD Left 03/31/2013    Procedure: ANORECTAL MANOMETRY;  Surgeon: None None;  [facility];  Service: Gastroenterology  PR BREATH HYDROGEN TEST N/A 03/31/2013    Procedure: BREATH HYDROGEN TEST;  Surgeon: None None;  Location: [facility];  Service: Gastroenterology    PR COLSC FLX W/RMVL OF TUMOR POLYP LESION SNARE TQ N/A 09/06/2020    Procedure: COLONOSCOPY FLEX; W/REMOV TUMOR/LES BY SNARE;  Surgeon: Chriss Driver, MD;  Location: [facility];  Service: Gastroenterology    PR UPPER GI ENDOSCOPY,BIOPSY N/A 04/15/2013    Procedure: UGI ENDOSCOPY; WITH BIOPSY, SINGLE OR MULTIPLE;  Surgeon: Vickii Chafe, MD;  Location: [facility];  Service: Gastroenterology    PR UPPER GI ENDOSCOPY,BIOPSY N/A 09/06/2020    Procedure: UGI ENDOSCOPY; WITH BIOPSY, SINGLE OR MULTIPLE;  Surgeon: Chriss Driver, MD;  [facility];  Service: Gastroenterology    PR UPPER GI ENDOSCOPY,BIOPSY N/A 05/05/2022    Procedure: UGI ENDOSCOPY; WITH BIOPSY, SINGLE OR MULTIPLE;  Surgeon: Chriss Driver, MD;  [facility]: [facility];  Service: Gastroenterology         Current Facility-Administered Medications:     lidocaine 4 % patch 1 patch, 1 patch, Transdermal, Once, Helyn App, PA, 1 patch at 12/19/22 1951    Current Outpatient Medications:     acetaminophen (TYLENOL) 325 MG tablet, Take 2 tablets (650 mg total) by mouth two (2) times a day as needed for pain., Disp: 180 tablet, Rfl: 1    aspirin (ECOTRIN) 81 MG tablet, Take 1 tablet (81 mg total) by mouth daily., Disp: 90 tablet, Rfl: 3    blood sugar diagnostic Strp, by Other route Two (2) times a day (30 minutes before a meal)., Disp: 100 strip, Rfl: 1    blood-glucose meter kit, Use as directed by provider., Disp: 1 each, Rfl: 0    buPROPion (WELLBUTRIN XL) 150 MG 24 hr tablet, Take 1 tablet (150 mg total) by mouth daily., Disp: , Rfl:     canagliflozin (INVOKANA) 300 mg Tab tablet, Take 1 tablet (300 mg total) by mouth daily before breakfast. NEED APPOINTMENT FOR FURTHER REFILLS., Disp: 90 tablet, Rfl: 0    carvedilol (COREG) 6.25 MG tablet, Take 1 tablet (6.25 mg total) by mouth Two (2) times a day. Start with 1 tablet nightly, if tolerating well increase to twice daily, Disp: 180 tablet, Rfl: 3    cholestyramine-aspartame (CHOLESTYRAMINE LIGHT) 4 gram PwPk, Mix 1 packet as directed and take by mouth daily., Disp: 60 packet, Rfl: 5    citalopram (CELEXA) 40 MG tablet, Take 1 tablet (40 mg total) by mouth daily., Disp: , Rfl:     clobetasol (TEMOVATE) 0.05 % ointment, Apply the medication twice daily to stubborn areas of the skin until smooth. Then stop and re-start as the skin changes come back., Disp: 60 g, Rfl: 5    clonazePAM (KLONOPIN) 0.5 MG tablet, Take 1 tablet (0.5 mg total) by mouth nightly as needed for anxiety and sleep. May repeat dose once., Disp: 40 tablet, Rfl: 1    cyclobenzaprine (FLEXERIL) 5 MG tablet, Take 1-2 tablets by mouth every 8 hours as needed muscle pain or spasm., Disp: 30 tablet, Rfl: 1    dulaglutide (TRULICITY) 1.5 mg/0.5 mL PnIj, Inject the contents of 2 syringes (3 mg total) under the skin every seven (7) days., Disp: 4 mL, Rfl: 6    ferrous sulfate (SLOW FE) 142 mg (45 mg iron) TbER, Take 1 tablet (142 mg) by mouth two (2) times a day., Disp: 60 tablet, Rfl: 3  furosemide (LASIX) 40 MG tablet, Take 1 tablet (40 mg total) by mouth daily., Disp: 90 tablet, Rfl: 3    insulin glargine (LANTUS SOLOSTAR U-100 INSULIN) 100 unit/mL (3 mL) injection pen, Inject 0.66 mL (66 Units total) under the skin nightly., Disp: 60 mL, Rfl: 1    lactulose 10 gram/15 mL solution, Take 30 mL (20 g total) by mouth Three (3) times a day., Disp: 240 mL, Rfl: 0    lancets Misc, Use as directed to test once daily., Disp: 100 each, Rfl: 1    metFORMIN (GLUCOPHAGE-XR) 500 MG 24 hr tablet, Take 2 tablets (1,000 mg total) by mouth in the morning and 2 tablets (1,000 mg total) in the evening. Take with meals., Disp: 360 tablet, Rfl: 1    pantoprazole (PROTONIX) 40 MG tablet, Take 1 tablet (40 mg total) by mouth two (2) times a day., Disp: 180 tablet, Rfl: 1    pen needle, diabetic 32 gauge x 1/4 (6 mm) Ndle, Inject 1 each under the skin nightly., Disp: 100 each, Rfl: 1    promethazine (PHENERGAN) 12.5 MG tablet, Take 1 tablet (12.5 mg total) by mouth every eight (8) hours as needed for nausea., Disp: 60 tablet, Rfl: 1    rifAXIMin (XIFAXAN) 550 mg Tab, Take 1 tablet (550 mg total) by mouth two (2) times a day., Disp: 60 tablet, Rfl: 11    secukinumab (COSENTYX PEN) 150 mg/mL PnIj injection, Inject the contents of 2 pens (150 mg total) under the skin weekly for 5 weeks. Loading dose., Disp: 10 mL, Rfl: 0    secukinumab (COSENTYX PEN, 2 PENS,) 150 mg/mL PnIj injection, Inject the contents of 2 pens (300 mg total) under the skin every twenty-eight (28) days. Maintenance dose, Disp: 2 mL, Rfl: 5    simvastatin (ZOCOR) 20 MG tablet, Take 1 tablet (20 mg total) by mouth every evening., Disp: 90 tablet, Rfl: 1    spironolactone (ALDACTONE) 100 MG tablet, Take 1 tablet (100 mg total) by mouth daily., Disp: 30 tablet, Rfl: 2    triamcinolone (KENALOG) 0.1 % ointment, Apply the medication twice daily to affected areas of the skin until smooth. Then stop and re-start as soon as the skin changes come back., Disp: 60 g, Rfl: 5    zolpidem (AMBIEN) 5 MG tablet, Take 1 tablet (5 mg total) by mouth nightly as needed for sleep., Disp: 90 tablet, Rfl: 1    Allergies  Patient has no known allergies.    Family History   Problem Relation Age of Onset    Diabetes Mother     Cancer Mother     No Known Problems Father     No Known Problems Sister     Diabetes Maternal Grandmother     Hypertension Maternal Grandmother     Cancer Maternal Grandfather     Diabetes Maternal Grandfather     No Known Problems Paternal Grandmother     Skin cancer Paternal Grandfather     Stroke Paternal Grandfather     No Known Problems Brother     No Known Problems Maternal Aunt     No Known Problems Maternal Uncle     No Known Problems Paternal Aunt     No Known Problems Paternal Uncle     No Known Problems Other     Melanoma Neg Hx     Basal cell carcinoma Neg Hx     Squamous cell carcinoma Neg Hx     Substance Abuse Disorder Neg Hx  Alcohol abuse Neg Hx     Drug abuse Neg Hx     Mental illness Neg Hx     Amblyopia Neg Hx     Blindness Neg Hx     Cataracts Neg Hx     Glaucoma Neg Hx     Macular degeneration Neg Hx     Retinal detachment Neg Hx     Strabismus Neg Hx     Thyroid disease Neg Hx     Breast cancer Neg Hx        Social History  Social History     Tobacco Use    Smoking status: Never     Passive exposure: Current    Smokeless tobacco: Never   Vaping Use    Vaping Use: Never used   Substance Use Topics    Alcohol use: Not Currently     Comment: No longer    Drug use: Not Currently       Review of Systems    A complete review of systems was performed and is negative other than as addressed in the HPI.    Physical Exam     ED Triage Vitals [12/19/22 1753]   Enc Vitals Group      BP 115/54      Heart Rate 77      SpO2 Pulse       Resp 16      Temp 36.7 ??C (98 ??F)      Temp Source Oral      SpO2 100 %      Weight 93.9 kg (207 lb)      Height 1.626 m (5' 4)      Head Circumference       Peak Flow       Pain Score       Pain Loc       Pain Edu?       Excl. in GC?        Constitutional: Alert and oriented. Well appearing and in no distress.  Eyes: No scleral icterus.  ENT       Head: Normocephalic and atraumatic.       Mouth/Throat: Mucous membranes are moist.       Neck: Supple  Cardiovascular: Normal rate, extremities well perfused.  Respiratory: Normal respiratory effort. Good tidal volume. Clear lungs.   Gastrointestinal: Soft and non-distended. Tenderness of mid and upper right abdomen without guarding or rebound.    Genitourinary: Bladder is non-distended.   Musculoskeletal: Patient freely moves all extremities. No warm or swollen joints. Right anterior and lateral chest wall is tender without crepitus or abnormal wall movement.   Neurologic: Normal speech and language. No gross focal neurologic deficits are appreciated.  Skin: Skin is warm, dry and intact. No rash noted.  Psychiatric: Mood and affect are normal. Speech and behavior are normal.      Radiology     CT Abdomen Pelvis W IV Contrast Only   Final Result      Hepatic cirrhosis with stigmata of portal hypertension including splenomegaly, varices and small volume ascites.      No evidence of acute traumatic pathology in the abdomen/pelvis.      Additional findings, as described above.            XR Ribs Right W PA Chest   Final Result   No acute displaced right-sided rib fracture identified.            Labs  Results for orders placed or performed during the hospital encounter of 12/19/22   Comprehensive metabolic panel   Result Value Ref Range    Sodium 137 135 - 145 mmol/L    Potassium 3.6 3.4 - 4.8 mmol/L    Chloride 100 98 - 107 mmol/L    CO2 28.7 20.0 - 31.0 mmol/L    Anion Gap 8 5 - 14 mmol/L    BUN 13 9 - 23 mg/dL    Creatinine 1.61 0.96 - 1.02 mg/dL    BUN/Creatinine Ratio 21     eGFR CKD-EPI (2021) Female >90 >=60 mL/min/1.30m2    Glucose 151 70 - 179 mg/dL Calcium 04.5 8.7 - 40.9 mg/dL    Albumin 3.4 3.4 - 5.0 g/dL    Total Protein 8.6 (H) 5.7 - 8.2 g/dL    Total Bilirubin 2.7 (H) 0.3 - 1.2 mg/dL    AST 71 (H) <=81 U/L    ALT 36 10 - 49 U/L    Alkaline Phosphatase 196 (H) 46 - 116 U/L   Lipase   Result Value Ref Range    Lipase 79 (H) 12 - 53 U/L   CBC w/ Differential   Result Value Ref Range    WBC 3.8 3.6 - 11.2 10*9/L    RBC 4.96 3.95 - 5.13 10*12/L    HGB 11.1 (L) 11.3 - 14.9 g/dL    HCT 19.1 47.8 - 29.5 %    MCV 72.3 (L) 77.6 - 95.7 fL    MCH 22.5 (L) 25.9 - 32.4 pg    MCHC 31.1 (L) 32.0 - 36.0 g/dL    RDW 62.1 (H) 30.8 - 15.2 %    MPV 8.4 6.8 - 10.7 fL    Platelet 158 150 - 450 10*9/L    nRBC 0 <=4 /100 WBCs    Neutrophils % 48.0 %    Lymphocytes % 34.7 %    Monocytes % 10.3 %    Eosinophils % 6.1 %    Basophils % 0.9 %    Absolute Neutrophils 1.8 1.8 - 7.8 10*9/L    Absolute Lymphocytes 1.3 1.1 - 3.6 10*9/L    Absolute Monocytes 0.4 0.3 - 0.8 10*9/L    Absolute Eosinophils 0.2 0.0 - 0.5 10*9/L    Absolute Basophils 0.0 0.0 - 0.1 10*9/L    Microcytosis Slight (A) Not Present    Anisocytosis Moderate (A) Not Present       Pertinent labs & imaging results that were available during my care of the patient were reviewed by me and considered in my medical decision making (see chart for details).      ED Clinical Impression     1. Fall, initial encounter          ED Course, Assessment and Plan     This woman with a complex past medical history and liver disease presents with a mechanical non-syncopal fall 2 days ago. She has injured her right side in the chest wall area as well as upper abdomen.  The pain is primarily when she takes a deep breath or coughs but she is not short of breath.      Exam reveals tenderness in these areas including tenderness of the RUQ and subcostal region.  Plan for xrays of the ribs, CTAP to assess for solid organ injury (liver primarily) as well as checking basic labs.      8:56 PM  Reviewed available labs and imaging at shift change. Xray ribs negative for displaced fracture.  CBC shows stable H&H.  Lipase minimally elevated but not twice normal limit. LFTs show modest elevation of T. Bili from baseline but transaminases at baseline. CTAP pending at signout to Dr. Collene Schlichter. If CT without evidence of solid organ injury or liver lac, would discharge home with reassurance and follow up with primary care.     9:09 PM  CT resulted and shows no evidence of trauma or intra-abdominal hemorrhage.  Will discharge home as planned.      Medications   lidocaine 4 % patch 1 patch (1 patch Transdermal Patch Applied 12/19/22 1951)   iohexol (OMNIPAQUE) 350 mg iodine/mL solution 100 mL (100 mL Intravenous Given 12/19/22 2013)         Discharge Medications:  New Prescriptions    No medications on file        ____________________________________________        Additional Medical Decision Making     I have reviewed the vital signs and the nursing notes. Labs and radiology results that were available during my care of the patient were independently reviewed by me and considered in my medical decision making.        I have reviewed the triage vital signs and the nursing notes.       Helyn App, PA  12/19/22 2059       Helyn App, [place]  12/19/22 2110

## 2023-01-01 ENCOUNTER — Ambulatory Visit
Admit: 2023-01-01 | Discharge: 2023-01-02 | Payer: PRIVATE HEALTH INSURANCE | Attending: Student in an Organized Health Care Education/Training Program | Primary: Student in an Organized Health Care Education/Training Program

## 2023-01-01 DIAGNOSIS — L439 Lichen planus, unspecified: Principal | ICD-10-CM

## 2023-01-01 DIAGNOSIS — K5900 Constipation, unspecified: Principal | ICD-10-CM

## 2023-01-01 DIAGNOSIS — R1011 Right upper quadrant pain: Principal | ICD-10-CM

## 2023-01-01 NOTE — Unmapped (Signed)
Ashley Briggs  October 20, 1966    Assessment & Plan:  Right upper quadrant abdominal pain    Lichen planus of tongue           No follow-ups on file.    Subjective:  Subjective    HPI: Ashley Briggs is a 57 y.o. female here for    Abdominal Pain  This is a new problem. The current episode started 1 to 4 weeks ago. The onset quality is sudden. The problem occurs constantly. The most recent episode lasted 24 hours. The problem has been unchanged. The pain is located in the RUQ. The pain is at a severity of 10/10. The quality of the pain is a sensation of fullness, sharp and tearing. The abdominal pain radiates to the back. Associated symptoms include anorexia, belching, a fever, flatus, frequency, headaches, nausea, vomiting and weight loss. Pertinent negatives include no arthralgias, constipation, diarrhea, dysuria, hematochezia, hematuria, melena or myalgias. The pain is aggravated by certain positions, coughing, deep breathing and movement. The pain is relieved by Being still. Prior diagnostic workup includes CT scan.       ED visit on 12/19/2022  HPI   Ashley Briggs is a 57 y.o. female with a past medical history of hepatosplenomegaly, cirrhosis of the liver, DM, endometrial cancer who presents with a fall 2 days ago.  She was walking her dog which pulled on the leash.  She lost her balance and fell onto the right side.  She has pain in the right rib area as well as right upper abdomen.  There is pain when she tries to take a deep breath or cough.  She worries not only about a rib injury but also for internal organ injury.  Otherwise she denies any new symptoms from her baseline.  No head injury or neck trauma.     MDM  This woman with a complex past medical history and liver disease presents with a mechanical non-syncopal fall 2 days ago. She has injured her right side in the chest wall area as well as upper abdomen.  The pain is primarily when she takes a deep breath or coughs but she is not short of breath. Exam reveals tenderness in these areas including tenderness of the RUQ and subcostal region.  Plan for xrays of the ribs, CTAP to assess for solid organ injury (liver primarily) as well as checking basic labs.       8:56 PM  Reviewed available labs and imaging at shift change.  Xray ribs negative for displaced fracture.  CBC shows stable H&H.  Lipase minimally elevated but not twice normal limit. LFTs show modest elevation of T. Bili from baseline but transaminases at baseline. CTAP pending at signout to Dr. Collene Schlichter. If CT without evidence of solid organ injury or liver lac, would discharge home with reassurance and follow up with primary care.      9:09 PM  CT resulted and shows no evidence of trauma or intra-abdominal hemorrhage.  Will discharge home as planned.        TODAY   -Still having dizziness, unsteadiness     ROS:  Review of systems negative unless otherwise noted as per HPI.    Objective:  Objective    Vitals:    01/01/23 1025   BP: 104/61   Pulse: 84   Temp: 37 ??C (98.6 ??F)   SpO2: 98%     Body mass index is 36.49 kg/m??.    Wt Readings from Last 3 Encounters:  01/01/23 96.4 kg (212 lb 9.6 oz)   12/19/22 93.9 kg (207 lb)   11/04/22 99.3 kg (219 lb)        Physical Exam:    Const  HEENT  Cardiac  Respiratory   Abdomen  Psych her right side in the chest wall area as well as upper abdomen.  The pain is primarily when she takes a deep breath or coughs but she is not short of breath.       Exam reveals tenderness in these areas including tenderness of the RUQ and subcostal region.  Plan for xrays of the ribs, CTAP to assess for solid organ injury (liver primarily) as well as checking basic labs.       8:56 PM  Reviewed available labs and imaging at shift change.  Xray ribs negative for displaced fracture.  CBC shows stable H&H.  Lipase minimally elevated but not twice normal limit. LFTs show modest elevation of T. Bili from baseline but transaminases at baseline. CTAP pending at signout to Dr. Collene Schlichter. If CT without evidence of solid organ injury or liver lac, would discharge home with reassurance and follow up with primary care.      9:09 PM  CT resulted and shows no evidence of trauma or intra-abdominal hemorrhage.  Will discharge home as planned.        TODAY   -Still having dizziness, unsteadiness   -Having issues swallowing due to pain    ROS:  Review of systems negative unless otherwise noted as per HPI.    Objective:  Objective    Vitals:    01/01/23 1025   BP: 104/61   Pulse: 84   Temp: 37 ??C (98.6 ??F)   SpO2: 98%     Body mass index is 36.49 kg/m??.    Wt Readings from Last 3 Encounters:   01/01/23 96.4 kg (212 lb 9.6 oz)   12/19/22 93.9 kg (207 lb)   11/04/22 99.3 kg (219 lb)        Physical Exam:    Const: not toxic appearing or in distress  HEENT: there are white plaques on the lateral aspect of her tongue. Not wearing her glasses on exam. No nasal congestion noted  Cardiac: HR is in regular range  Respiratory: Effort is normal, no cough noted  Abdomen: Abdomen is soft, a bit more distended from before. Reports tenderness to palpation of right upper quadrant. No ecchymosis noted on abdomen. There is no guarding or rebound tenderness noted on exam.   Skin: The psoriatic lesions on her abdomen have significantly improved from prior  MSK: Gait is stable  Psych: pleasant  Neuro: AOX3

## 2023-01-01 NOTE — Unmapped (Signed)
Contact [phone]  to schedule Ultrasound

## 2023-01-03 DIAGNOSIS — L439 Lichen planus, unspecified: Secondary | ICD-10-CM | POA: Insufficient documentation

## 2023-01-04 DIAGNOSIS — L439 Lichen planus, unspecified: Principal | ICD-10-CM

## 2023-01-04 DIAGNOSIS — R1310 Dysphagia, unspecified: Principal | ICD-10-CM

## 2023-01-05 DIAGNOSIS — R221 Localized swelling, mass and lump, neck: Principal | ICD-10-CM

## 2023-01-05 DIAGNOSIS — R220 Localized swelling, mass and lump, head: Principal | ICD-10-CM

## 2023-01-06 DIAGNOSIS — E785 Hyperlipidemia, unspecified: Principal | ICD-10-CM

## 2023-01-06 MED ORDER — SIMVASTATIN 20 MG TABLET
ORAL_TABLET | Freq: Every evening | ORAL | 0 refills | 90 days | Status: CP
Start: 2023-01-06 — End: 2024-01-06
  Filled 2023-01-16: qty 90, 90d supply, fill #0

## 2023-01-07 NOTE — Unmapped (Signed)
EGD  Procedure #1     Procedure #2   [id number]  MRN     Endoscopist     Is the patient's health insurance [address], [facility]), or [employer]?     Urgent procedure     Are you pregnant?     Are you in the process of scheduling or awaiting results of a heart ultrasound, stress test, or catheterization to evaluate new or worsening chest pain, dizziness, or shortness of breath?     Do you take: Plavix (clopidogrel), Coumadin (warfarin), Lovenox (enoxaparin), Pradaxa (dabigatran), Effient (prasugrel), Xarelto (rivaroxaban), Eliquis (apixaban), Pletal (cilostazol), or Brilinta (ticagrelor)?          Which of the above medications are you taking?          What is the name of the medical practice that manages this medication?          What is the name of the medical provider who manages this medication?     Do you have hemophilia, von Willebrand disease, or low platelets?     Do you have a pacemaker or implanted cardiac defibrillator?     Has a [place] GI provider specified the location(s)?     Which location(s) did the [facility] provider specify?        [facility]        [place]        [facility]-Propofol        HMO[facility]-Mod Sedation     Is procedure indication for variceal banding (this does NOT include variceal screening)?     Have you had a heart attack, stroke or heart stent placement within the past 6 months?     Month of event     Year of event (ONLY ENTER LAST 2 DIGITS)        5  Height (feet)   4  Height (inches)   212  Weight (pounds)   36.4  BMI          Did the ordering provider specify a bowel prep?          What bowel prep was specified?     Do you have chronic kidney disease?     Do you have chronic constipation or have you had poor quality bowel preps for past colonoscopies?     Do you have Crohn's disease or ulcerative colitis?     Have you had weight loss surgery?          When you walk around your house or grocery store, do you have to stop and rest due to shortness of breath, chest pain, or light-headedness?     Do you ever use supplemental oxygen?   TRUE  Have you been hospitalized for cirrhosis of the liver or heart failure in the last 12 months?     Have you been treated for mouth or throat cancer with radiation or surgery?     Have you been told that it is difficult for doctors to insert a breathing tube in you during anesthesia?     Have you had a heart or lung transplant?          Are you on dialysis?     Do you have cirrhosis of the liver?     Do you have myasthenia gravis?     Is the patient a prisoner?          Have you been diagnosed with sleep apnea or do you wear a  CPAP machine at night?     Are you younger than 30?     Have you previously received propofol sedation administered by an anesthesiologist for a GI procedure?     Do you drink an average of more than 3 drinks of alcohol per day?     Do you regularly take suboxone or any prescription medications for chronic pain?     Do you regularly take Ativan, Klonopin, Xanax, Valium, lorazepam, clonazepam, alprazolam, or diazepam?     Have you previously had difficulty with sedation during a GI procedure?     Have you been diagnosed with PTSD?     Are you allergic to fentanyl or midazolam (Versed)?     Do you take medications for HIV?   ################# ## ###################################################################################################################   MRN:          [id number]   Anticoag Review:  No   Nurse Triage:  No   GI Clinic Consult:  No   Procedure(s):  EGD     0   [facility]                  Endoscopist:  0   Urgent:            No   Prep:                                 ################# ## ###################################################################################################################

## 2023-01-14 ENCOUNTER — Ambulatory Visit: Admit: 2023-01-14 | Discharge: 2023-01-15 | Payer: PRIVATE HEALTH INSURANCE

## 2023-01-14 NOTE — Unmapped (Signed)
Ashley Briggs requested a refill of their XIFAXAN 550 mg Tab (rifAXIMin) via IVR. The [facility] has scheduled delivery per the patients request via Same Day Courier to be delivered to their prescription address on 2/16.

## 2023-01-15 NOTE — Unmapped (Signed)
[facility] Refill Coordination Note    Specialty Medication(s) to be Shipped:   Inflammatory Disorders: Cosentyx    Other medication(s) to be shipped:  pen needles,furosemide,lancets     Ashley Briggs, DOB: 08-Nov-1966  Phone: There are no phone numbers on file.      All above HIPAA information was verified with patient.     Was a Nurse, learning disability used for this call? No    Completed refill call assessment today to schedule patient's medication shipment from the [facility] [phone]).  All relevant notes have been reviewed.     Specialty medication(s) and dose(s) confirmed: Regimen is correct and unchanged.   Changes to medications: Kourtlynn reports no changes at this time.  Changes to insurance: No  New side effects reported not previously addressed with a pharmacist or physician: None reported  Questions for the pharmacist: No    Confirmed patient received a Conservation officer, historic buildings and a Surveyor, mining with first shipment. The patient will receive a drug information handout for each medication shipped and additional FDA Medication Guides as required.       DISEASE/MEDICATION-SPECIFIC INFORMATION        For patients on injectable medications: Patient currently has 0 doses left.  Next injection is scheduled for 02/27.    SPECIALTY MEDICATION ADHERENCE     Medication Adherence    Patient reported X missed doses in the last month: 0  Specialty Medication: Cosentyx  Patient is on additional specialty medications: Yes  Additional Specialty Medications: Xifaxin  Patient Reported Additional Medication X Missed Doses in the Last Month: 0  Patient is on more than two specialty medications: No  Any gaps in refill history greater than 2 weeks in the last 3 months: no  Demonstrates understanding of importance of adherence: yes  Informant: patient  Reliability of informant: reliable  Provider-estimated medication adherence level: good  Patient is at risk for Non-Adherence: No  Reasons for non-adherence: no problems identified  Confirmed plan for next specialty medication refill: delivery by pharmacy  Refills needed for supportive medications: not needed          Refill Coordination    Has the Patients' Contact Information Changed: No  Is the Shipping Address Different: No         Were doses missed due to medication being on hold? No    cosentyx 150 mg/ml: 0 days of medicine on hand       REFERRAL TO PHARMACIST     Referral to the pharmacist: Not needed      [facility]     Shipping address confirmed in Epic.     Patient was notified of new phone menu : No    Delivery Scheduled: Yes, Expected medication delivery date: 02/16.     Medication will be delivered via Same Day Courier to the prescription address in Epic WAM.    Antonietta Barcelona   [facility] Specialty Technician

## 2023-01-16 MED FILL — COSENTYX PEN 300 MG/2 PENS (150 MG/ML) SUBCUTANEOUS: SUBCUTANEOUS | 28 days supply | Qty: 2 | Fill #1

## 2023-01-16 MED FILL — FUROSEMIDE 40 MG TABLET: ORAL | 90 days supply | Qty: 90 | Fill #1

## 2023-01-16 MED FILL — XIFAXAN 550 MG TABLET: ORAL | 28 days supply | Qty: 56 | Fill #3

## 2023-01-16 MED FILL — ULTICARE PEN NEEDLE 32 GAUGE X 1/4" (6 MM): SUBCUTANEOUS | 90 days supply | Qty: 100 | Fill #0
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
## 2023-01-19 ENCOUNTER — Ambulatory Visit: Payer: Medicaid Other | Admitting: Psychiatry

## 2023-01-20 ENCOUNTER — Other Ambulatory Visit: Payer: Self-pay | Admitting: Psychiatry

## 2023-01-20 DIAGNOSIS — G8929 Other chronic pain: Principal | ICD-10-CM

## 2023-01-20 NOTE — Telephone Encounter (Signed)
Ordered refill of bupropion per request. Please contact the patient to make follow up appointment.

## 2023-01-22 ENCOUNTER — Encounter
Admit: 2023-01-22 | Discharge: 2023-01-22 | Payer: PRIVATE HEALTH INSURANCE | Attending: Anesthesiology | Primary: Anesthesiology

## 2023-01-22 ENCOUNTER — Ambulatory Visit: Admit: 2023-01-22 | Discharge: 2023-01-22 | Payer: PRIVATE HEALTH INSURANCE

## 2023-01-22 MED ADMIN — Propofol (DIPRIVAN) injection: INTRAVENOUS | @ 13:00:00 | Stop: 2023-01-22

## 2023-01-22 MED ADMIN — lidocaine (XYLOCAINE) 20 mg/mL (2 %) injection: INTRAVENOUS | @ 13:00:00 | Stop: 2023-01-22

## 2023-01-22 MED ADMIN — lactated Ringers infusion: 10 mL/h | INTRAVENOUS | @ 12:00:00 | Stop: 2023-01-22

## 2023-01-23 MED FILL — CLOBETASOL 0.05 % TOPICAL OINTMENT: 14 days supply | Qty: 60 | Fill #3

## 2023-01-23 MED FILL — TRIAMCINOLONE ACETONIDE 0.1 % TOPICAL OINTMENT: 28 days supply | Qty: 60 | Fill #3

## 2023-01-23 NOTE — Unmapped (Signed)
-----   Message from Abbie Sons, MD sent at 01/23/2023 10:46 AM EST -----  Regarding: RE: First available appointment  You can offer her resident continuity clinic if available. Thanks!  ----- Message -----  From: Adonis Housekeeper  Sent: 01/23/2023  10:17 AM EST  To: Abbie Sons, MD; #  Subject: RE: First available appointment                  Your next opening is not until April. Would you like for patient to be seen by someone else?    Thanks,  Toni Amend    ----- Message -----  From: Abbie Sons, MD  Sent: 01/23/2023  10:14 AM EST  To: #  Subject: First available appointment                      Hello, could we please call and make this patient a first available appointment? Thanks!

## 2023-01-27 ENCOUNTER — Ambulatory Visit: Admit: 2023-01-27 | Discharge: 2023-01-28 | Payer: PRIVATE HEALTH INSURANCE

## 2023-01-27 DIAGNOSIS — K7581 Nonalcoholic steatohepatitis (NASH): Principal | ICD-10-CM

## 2023-01-27 DIAGNOSIS — K7682 Hepatic encephalopathy (CMS-HCC): Principal | ICD-10-CM

## 2023-01-27 DIAGNOSIS — K766 Portal hypertension: Principal | ICD-10-CM

## 2023-01-27 DIAGNOSIS — L299 Pruritus, unspecified: Principal | ICD-10-CM

## 2023-01-27 DIAGNOSIS — K7460 Unspecified cirrhosis of liver: Principal | ICD-10-CM

## 2023-01-27 DIAGNOSIS — R1310 Dysphagia, unspecified: Principal | ICD-10-CM

## 2023-01-27 LAB — COMPREHENSIVE METABOLIC PANEL
ALBUMIN: 2.6 g/dL — ABNORMAL LOW (ref 3.4–5.0)
ALKALINE PHOSPHATASE: 207 U/L — ABNORMAL HIGH (ref 46–116)
ALT (SGPT): 30 U/L (ref 10–49)
ANION GAP: 11 mmol/L (ref 5–14)
AST (SGOT): 58 U/L — ABNORMAL HIGH (ref <=34)
BILIRUBIN TOTAL: 1.7 mg/dL — ABNORMAL HIGH (ref 0.3–1.2)
BLOOD UREA NITROGEN: 15 mg/dL (ref 9–23)
BUN / CREAT RATIO: 24
CALCIUM: 9.3 mg/dL (ref 8.7–10.4)
CHLORIDE: 104 mmol/L (ref 98–107)
CO2: 24 mmol/L (ref 20.0–31.0)
CREATININE: 0.62 mg/dL
EGFR CKD-EPI (2021) FEMALE: >90 mL/min/{1.73_m2} (ref >=60)
GLUCOSE RANDOM: 174 mg/dL — ABNORMAL HIGH (ref 70–99)
POTASSIUM: 4.7 mmol/L (ref 3.5–5.1)
PROTEIN TOTAL: 7.9 g/dL (ref 5.7–8.2)
SODIUM: 139 mmol/L (ref 135–145)

## 2023-01-27 LAB — CBC W/ AUTO DIFF
BASOPHILS ABSOLUTE COUNT: 0 10*9/L (ref 0.0–0.1)
BASOPHILS RELATIVE PERCENT: 0.4 %
EOSINOPHILS ABSOLUTE COUNT: 0.3 10*9/L (ref 0.0–0.5)
EOSINOPHILS RELATIVE PERCENT: 7.9 %
HEMATOCRIT: 33.4 % — ABNORMAL LOW (ref 34.0–44.0)
HEMOGLOBIN: 10.5 g/dL — ABNORMAL LOW (ref 11.3–14.9)
LYMPHOCYTES ABSOLUTE COUNT: 1.2 10*9/L (ref 1.1–3.6)
LYMPHOCYTES RELATIVE PERCENT: 29.5 %
MEAN CORPUSCULAR HEMOGLOBIN CONC: 31.6 g/dL — ABNORMAL LOW (ref 32.0–36.0)
MEAN CORPUSCULAR HEMOGLOBIN: 22.9 pg — ABNORMAL LOW (ref 25.9–32.4)
MEAN CORPUSCULAR VOLUME: 72.6 fL — ABNORMAL LOW (ref 77.6–95.7)
MEAN PLATELET VOLUME: 8.6 fL (ref 6.8–10.7)
MONOCYTES ABSOLUTE COUNT: 0.3 10*9/L (ref 0.3–0.8)
MONOCYTES RELATIVE PERCENT: 8.6 %
NEUTROPHILS ABSOLUTE COUNT: 2.1 10*9/L (ref 1.8–7.8)
NEUTROPHILS RELATIVE PERCENT: 53.6 %
PLATELET COUNT: 176 10*9/L (ref 150–450)
RED BLOOD CELL COUNT: 4.59 10*12/L (ref 3.95–5.13)
RED CELL DISTRIBUTION WIDTH: 20.2 % — ABNORMAL HIGH (ref 12.2–15.2)
WBC ADJUSTED: 4 10*9/L (ref 3.6–11.2)

## 2023-01-27 LAB — PROTIME-INR
INR: 1.16
PROTIME: 12.9 s — ABNORMAL HIGH (ref 9.9–12.6)

## 2023-01-27 LAB — AFP TUMOR MARKER: AFP-TUMOR MARKER: 2 ng/mL (ref <=8)

## 2023-01-27 LAB — SLIDE REVIEW

## 2023-01-27 NOTE — Unmapped (Signed)
Per provider, the patient received Twinrix and Shingrix vaccines.  Patient ID verified with name and date of birth.  All screening questions were answered.  Vaccine(s) were administered as ordered.  See immunization history for documentation.  Patient tolerated the injection(s) well with no issues noted.  Vaccine Information sheet given to the patient.

## 2023-01-27 NOTE — Unmapped (Signed)
[facility]  01/27/2023    Reason for visit: Follow up for Liver cirrhosis secondary to NASH (CMS-HCC) [K75.81, K74.60]    Assessment/Plan:    57 y.o. 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     #Decompensated MASH cirrhosis: Decompensated by HE, as well as new onset ascites in October 2023. Her volume overload appears diuretic responsive. At last clinic visit, discussed progression of disease and liver transplantation. Given low MELD, LDLT would be only option. Her cousin has expressed interest but states she was told she needs to work on hyperglycemia/WT loss prior to proceeding. Pt will continue to look for possible donors.   - Repeat MELD labs today  - Prev discussed protein intake, and evening high protein snack before bedtime  - Prev discussed modest weight loss, lifestyle changes    #New onset ascites: Ascites that began in 08/2022 without clear precipitant. Has followed Low Na diet and lasix 40/spiro 100. CTAP in Jan did mention mild/mod ascites, but weight stable, symptomatically doing well and so will keep diuretics at current dose. Discussed signs/symptoms she should be aware of that would prompt changes to doses.   - MELD labs as above   - Continue lasix 40/spironolactone 100  - Low Na diet (000mg )    #HCC Screening: CTAP in Jan with no concerning liver lesions. Obtain AFP today.    -- Cont q 6 month screening. Due June 2024 ([place] ordered)    #HE: Controlled   - Patient counseled that she should no longer drive now that she has developed HE. Have prev discussed risks of continued driving with HE.   - Recommended minimizing ambien/oxycodone as much as possible as these can worsen/precipitate HE. Recommended she discuss other sleep aids with PCP (like trazodone, melatonin)  - Rifaximin 550mg  BID  - Lactulose, was using more PRN. Discussed using regularly with goal of ~ 3 Bms daily     #Dysphagia: Pt reports that she has had issues with swallowing. Was diagnosed with lichen planus of tongue recently. She does endorse issues with chewing/mastication, and so may have oropharyngeal component, but she does report occasional sensation of food and pills getting stuck in her throat. She has seen an oral surgeon for her lichen planus of tongue, and PCP arranged EGD. This showed no evidence of stricture, and biopsies taken from esophagus were somewhat equivocal but could not rule out lichen planus of esophagus. Discussed that she should follow up with the oral surgeon, and also offered referral to [facility] for further eval of her dysphagia, which she is interested in.     -- Referral to [facility]     #Pruritus: (improved)  - Cholestyramine prescribed daily      #Preventive Hepatology Care:  - Hepatitis A Vaccine:  Dose # 3 today   - Hepatitis B Vaccine:  Dose # 3 today   - Pneumovax: S/p PCV 05/2022  - COVID: s/p bivalent 05/2022, Given spike vax in Nov 2023   - Shingrix : Dose #2 today   - S/p flu shot 08/2022  - Colonoscopy: Due in 2028 for next colonoscopy  - Pap Smear: s/p hysterectomy    Return in about 6 months (around 07/28/2023).    Subjective   History of Present Illness   Accompanied by: cousin    57 y.o. 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     Interval History:  - Last seen by Dr. Eudelia Bunch Nov 2023     Patient states that she unfortunately had a bad fall in January.  Was walking her dog on the edge near a ditch and lost her footing and fell onto her right side.  She was evaluated in the emergency room with an overall unremarkable workup in terms of significant traumatic injury.  However, she has reported ongoing lingering right upper quadrant pain.  Not severe but still noticeable.  She discussed this with her primary care physician has arranged for repeat abdominal ultrasound which is scheduled on March 20.  She also reports that her PCP has arranged an ultrasound of her neck out of concern for possible lymphadenopathy.    Not having significant issues with overt hepatic encephalopathy but does note occasional brain fog/slowed thinking.  Has been using rifaximin.  States that she is taking lactulose more as needed if she does not have a bowel movement for several days.  Discussed the indications for lactulose and how it works, and the overall goal to take more regularly to achieve closer to 3 bowel movements daily.  She states she will start to do this.    We did review her CT scan from January which did show mild to moderate ascites.  Her weight has been stable.  Symptomatically she does not feel like her abdomen is more distended or uncomfortable from the standpoint.  She remains on her diuretics, and adheres to low-sodium diet.  Discussed plan to continue to monitor for now symptomatically she is doing well and her weight is stable.  Did discuss that if her abdomen seems to be more distended, or her weight is increasing then she should let us know so we can make necessary adjustments.    Discussed again that in the setting of decompensated cirrhosis that considering liver transplantation is indicated.  Right now the only option would be living donor liver transplant.  Her cousin who is with her today did endorse interest in being considered as a potential donor.  Provided her with contact information for our living donor living transplant coordinator so she can be in touch to see if she would actually be considered a suitable candidate or if there are additional concerns she needs to work on before this including weight loss, glycemic control, etc.    Patient reports her pruritus is significantly improved on daily cholestyramine.    Patient reports she has had ongoing issues with difficulty swallowing.  She states that she was evaluated by an oral surgeon recently and had a biopsy of her tongue which showed evidence of lichen planus of tongue.  She states that her oral surgeon wished for her to have an upper endoscopy with biopsies to see if there is any involvement of the esophagus.  This was performed recently this month.  No obvious stricture or severe inflammation in the esophagus.  Biopsies were obtained which could not rule out lichen planus of the esophagus.  States that she does have significant issues with chewing and masticating her food which can be an issue in regard to swallowing, but does report occasional feeling that solid food becomes stuck in the esophagus, as well as occasionally pills/tablets.  Discussed plan for her to continue to follow-up with the oral surgeon to see if there are additional therapeutic interventions they were going to start, she is  also interested in further evaluation for dysphagia and discussed a referral to our [facility] group.     Objective   Physical Exam   Vital Signs: BP 108/57 (BP Site: L Arm, BP Position: Sitting, BP Cuff Size: Medium)  - Pulse 73  - Temp 36.4 ??C (97.5 ??F) (Tympanic)  - Ht 162.6 cm (5' 4)  - Wt 97.2 kg (214 lb 3.2 oz)  - LMP 08/03/2006  - SpO2 98%  - BMI 36.77 kg/m??   Constitutional: She is in no apparent distress  Eyes: Anicteric sclerae  Cardiovascular: Trace peripheral edema  Gastrointestinal: Soft, mild TTP in RUQ, no rebound, no guarding   Neurologic: Awake, alert, and oriented to person, place, and time with normal speech and no asterixis  Skin: negative for jaundice     MELD 3.0: 12 at 10/07/2022 12:30 PM  MELD-Na: 10 at 10/07/2022 12:30 PM  Calculated from:  Serum Creatinine: 0.69 mg/dL (Using min of 1 mg/dL) at 16/12/958 45:40 PM  Serum Sodium: 139 mmol/L (Using max of 137 mmol/L) at 10/07/2022 12:30 PM  Total Bilirubin: 1.5 mg/dL at 98/12/1912 78:29 PM  Serum Albumin: 3.0 g/dL at 56/01/1307 65:78 PM  INR(ratio): 1.20 at 10/07/2022 12:30 PM  Age at listing (hypothetical): 55 years  Sex: Female at 10/07/2022 12:30 PM

## 2023-01-27 NOTE — Unmapped (Addendum)
[facility]  [facility]  [address]  [place], [place] [address]  Main Clinic: [phone]  Appointment Schedulers: [phone]  Silvestre Moment, RN: [phone]  Fax: [phone]       Thank you for allowing me to participate in your medical care today. Here are my recommendations based on today's visit:    We will check labs today   We made a referral to our swallowing experts in GI, you can call [phone] if needed to help schedule this   Get an ultrasound of your liver every 6 months to look for early signs of liver cancer  Limit salt (sodium) intake to less than 2000 mg per day  Eat a high protein diet (1.2-1.5 grams per kg body weight per day, typically about 100 grams of protein a day). Eat small, frequent meals, high-protein late-night snacks, and use protein supplements such as Ensure or Boost.  Adjust lactulose dose or frequency to produce a target of 3 soft bowel movements a day  You should not drive at all due to the increased risk of traffic accidents  Avoid NSAIDs (ibuprofen, naproxen, Advil, Motrin, Aleve, Naprosyn, etc). Acetaminophen (Tylenol) up to 2000 mg a day is ok    Take care

## 2023-02-01 DIAGNOSIS — R188 Other ascites: Principal | ICD-10-CM

## 2023-02-01 DIAGNOSIS — R601 Generalized edema: Principal | ICD-10-CM

## 2023-02-01 MED ORDER — SPIRONOLACTONE 100 MG TABLET
ORAL_TABLET | Freq: Every day | ORAL | 2 refills | 30 days
Start: 2023-02-01 — End: 2023-05-02

## 2023-02-02 ENCOUNTER — Ambulatory Visit
Admit: 2023-02-02 | Discharge: 2023-02-03 | Payer: PRIVATE HEALTH INSURANCE | Attending: Dermatology | Primary: Dermatology

## 2023-02-02 DIAGNOSIS — R220 Localized swelling, mass and lump, head: Principal | ICD-10-CM

## 2023-02-02 DIAGNOSIS — L409 Psoriasis, unspecified: Principal | ICD-10-CM

## 2023-02-02 DIAGNOSIS — B079 Viral wart, unspecified: Principal | ICD-10-CM

## 2023-02-02 DIAGNOSIS — Z79899 Other long term (current) drug therapy: Principal | ICD-10-CM

## 2023-02-02 DIAGNOSIS — R1310 Dysphagia, unspecified: Secondary | ICD-10-CM | POA: Insufficient documentation

## 2023-02-02 DIAGNOSIS — L299 Pruritus, unspecified: Secondary | ICD-10-CM | POA: Insufficient documentation

## 2023-02-02 MED ORDER — SPIRONOLACTONE 100 MG TABLET
ORAL_TABLET | Freq: Every day | ORAL | 2 refills | 30 days | Status: CP
Start: 2023-02-02 — End: 2023-05-03
  Filled 2023-02-04: qty 30, 30d supply, fill #0

## 2023-02-02 NOTE — Unmapped (Signed)
The patient is requesting a medication refill

## 2023-02-02 NOTE — Unmapped (Signed)
Dermatology Note     Assessment and Plan:      Verruca vulgaris, L third digit  - Discussed diagnosis, typical course, and treatment options for this condition  - Patient opted for cryotherapy treatment at today's visit  - Informed patient that multiple treatments will be necessary, study shows an average of roughly 5 treatments    Cryotherapy Procedure Note  Cryotherapy of warts was performed using liquid nitrogen after discussing the risks and benefits of the procedure including scarring, dyspigmentation, recurrence or persistence of the lesion.      Number treated: 2    Plaque psoriasis, trunk and extremities, previously ~30% BSA, improving significantly on Cosentyx, chronic not at treatment goal  Previously on Cosentyx starting in 09/2019 with improvement, then stopped for about 4-5 months and restarted in May 2022 without loading doses for another 6 months. No improvement on Ustekinumab Ashley Briggs) from Jan 2023 to August 2023. No improvement on adalimumab (Humira) from Aug 2023 to October 2023.  - Today patient endorses blistering with subsequent redness and desquamation of hands, not observed on exam today. May be due to psoriasis. Plan to continue to treat with Cosentyx and topical steroids as below.  - Patient states that when she was on Cosentyx as directed (without any missed doses) it was very effective  - Due to recent diagnosis of cirrhosis, will contact liver doctor Dr. Eudelia Briggs in coordinating care. Sent staff message to GI team. They are ok with Cosentyx dosing.  - Has known osteoarthritis in knees, no other joint pain. Has been seen by a rheumatologist, who diagnosed her with osteoarthritis of bilateral knees, and does not think the patient has psoriatic arthritis.  - continue Cosentyx 300mg  monthly (starting following last loading dose)  - continue clobetasoL (TEMOVATE) 0.05 % ointment; Apply the medication twice daily to stubborn areas of the skin until smooth. Then stop and re-start as the skin changes come back.  - continue triamcinolone (KENALOG) 0.1 % ointment; Apply the medication twice daily to affected areas of the skin until smooth. Then stop and re-start as soon as the skin changes come back.    Lip swelling, resolved  - patient states that a few weeks ago she experienced lip swelling  - did not experience any trouble breathing  - resolved on exam today  - continue to monitor    High risk medication use, Cosentyx  - Negative quant gold and hepatitis panel on 06/16/22    The patient was advised to call for an appointment should any new, changing, or symptomatic lesions develop.     RTC: Return in about 6 months (around 08/05/2023) for Psoriasis. or sooner as needed   _________________________________________________________________      Chief Complaint     Chief Complaint   Patient presents with    Psoriasis     Pt here for follow up Psoriasis, hands and lips       HPI     Ashley Briggs is a 57 y.o. female who presents as a returning patient (last seen 11/11/2022) to Dermatology for follow up of psoriasis.     Today patient reports:  - psoriasis is improving on Cosentyx 300mg  monthly  - she resumed Cosentyx a few months ago  - patient is experiencing blistering on her hands followed by redness and peeling, not observed on exam today  - patient is applying OTC lotion and clobetasol under occlusion to her hands  - patient notes 2 tender spots on her L hand that arose  around a week ago  - patient experienced lip swelling a few weeks ago without any trouble breathing    The patient denies any other new or changing lesions or areas of concern.     Pertinent Past Medical History     Past Medical History, Family History, Social History, Medication List, Allergies, and Problem List were reviewed in the rooming section of Epic.     ROS: Other than symptoms mentioned in the HPI, no fevers, chills, or other skin complaints    Physical Examination     GENERAL: Well-appearing female in no acute distress, resting comfortably.  NEURO: Alert and oriented, answers questions appropriately  SKIN: Examination of the chest, abdomen, back, bilateral upper extremities, and hands was performed    - Well circumscribed erythematous papules and plaques with overlying silvery scale on flexural surfaces including elbows and trunk, improving  - Well-demarcated verrucous, hyperkeratotic papule(s) located on the L third digit  - Xerosis of hands    All areas not commented on are within normal limits or unremarkable    (Approved Template 08/13/2020)

## 2023-02-04 DIAGNOSIS — K7682 Hepatic encephalopathy (CMS-HCC): Principal | ICD-10-CM

## 2023-02-04 MED ORDER — LACTULOSE 10 GRAM/15 ML ORAL SOLUTION
Freq: Three times a day (TID) | ORAL | 0 refills | 3 days
Start: 2023-02-04 — End: 2024-02-04

## 2023-02-06 NOTE — Unmapped (Signed)
Endocrinology Follow-up    Ashley Briggs is a 57 y.o. female seen in Endocrinology consultation follow-up on 02/11/23 for Diabetes    Patient was last seen by me on 03/12/22 for type 2 diabetes.     Medication regimen consists of Trulicity 3 mg weekly, metformin 1000 mg twice daily, Lantus 66 units every evening, and Invokana 300 mg daily.     Glucose in clinic is 234.     Patient reports her blood sugar has been as low as 105 and as high as 256. Mid day yesterday, her glucose was 175. 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     Denies any recent hypoglycemia.     Her meal portions and carbohydrate intake have reduced. Not eating fast food.     Sugar is usually above 200 after eating.     She has had multiple stressors contributing to her high sugars.     ROS: No CP, SOB, DOE, swelling, paresthesias, recent vision changes, polyuria, or polydipsia, or skin/feet problems.       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 She has numerous hyperglycemic episodes, especially after eating. POCT glucose is 256. A1c is pending.    Plan:  -Start taking Novolog 10 units fifteen minutes before each meal.   -Continue taking Trulicity 3 mg weekly, metformin 1000 mg twice daily, Lantus 66 units every evening, and Invokana 300 mg daily.  -Follow up in 3 months.    Lab Results   Component Value Date    A1C 7.8 (A) 11/04/2022    A1C 9.8 (A) 09/02/2022    A1C 6.4 (A) 01/13/2022    GLU 174 (H) 01/27/2023    GLU 151 12/19/2022    GLU 94 10/07/2022    CREATININE 0.62 01/27/2023    CREATININE 0.61 12/19/2022    CREATININE 0.69 10/07/2022    ALBCRERAT  02/11/2022 Comment:      Unable to calculate.    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      Coronary artery disease involving native heart without angina pectoris, unspecified vessel or lesion type    Hypertension, unspecified type  Overview:  Last Assessment & Plan:   The current medical regimen is effective;  continue present plan and medications.      Hepatic encephalopathy (CMS-HCC)    Hepatic cirrhosis, unspecified hepatic cirrhosis type, unspecified whether ascites present (CMS-HCC)    H/O diabetic gastroparesis    Secondary esophageal varices without bleeding (CMS-HCC)    Anasarca        Return in about 3 months (around 05/14/2023).    Patient Instructions   Start taking 10 units Humalog or Novolog (depending on what insurance covers) up to 15 minutes before each meal.       Subjective:     History of Present Illness: See problem oriented charting.    Past Medical History:   Diagnosis Date    Anxiety     Arthritis     Blepharitis     Cirrhosis (CMS-HCC)     Diabetes mellitus (CMS-HCC)     Endometrial cancer (CMS-HCC) 2007    Gastroparesis     Heart disease     High cholesterol     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    Liver disease     Major depression     Non-alcoholic fatty liver disease     NPDR (nonproliferative diabetic retinopathy) (CMS-HCC)     Right eye    Psoriasis     Ptosis of eyelid, left     Left upper eyelid    Reflux         Past Surgical History:   Procedure Laterality Date    CHOLECYSTECTOMY      HYSTERECTOMY      endometrial cancer    HYSTERECTOMY      OOPHORECTOMY      PR ANAL PRESSURE RECORD Left 03/31/2013    Procedure: ANORECTAL MANOMETRY;  Surgeon: None None;  Location: [facility];  Service: Gastroenterology    PR BREATH HYDROGEN TEST N/A 03/31/2013    Procedure: BREATH HYDROGEN TEST;  Surgeon: None None;  Location: [facility];  Service: Gastroenterology    PR COLSC FLX W/RMVL OF TUMOR POLYP LESION SNARE TQ N/A 09/06/2020    Procedure: COLONOSCOPY FLEX; W/REMOV TUMOR/LES BY SNARE;  Surgeon: Chriss Driver, MD;  [facility];  Service: Gastroenterology    PR UPPER GI ENDOSCOPY,BIOPSY N/A 04/15/2013    Procedure: UGI ENDOSCOPY; WITH BIOPSY, SINGLE OR MULTIPLE;  Surgeon: Vickii Chafe, MD;  Location: [facility];  Service: Gastroenterology    PR UPPER GI ENDOSCOPY,BIOPSY N/A 09/06/2020    Procedure: UGI ENDOSCOPY; WITH BIOPSY, SINGLE OR MULTIPLE;  Surgeon: Chriss Driver, MD;  Location: [facility];  Service: Gastroenterology    PR UPPER GI ENDOSCOPY,BIOPSY N/A 05/05/2022    Procedure: UGI ENDOSCOPY; WITH BIOPSY, SINGLE OR MULTIPLE;  Surgeon: Chriss Driver, MD;  Location: [facility];  Service: Gastroenterology    PR UPPER GI ENDOSCOPY,BIOPSY N/A 01/22/2023    Procedure: UGI ENDOSCOPY; WITH BIOPSY, SINGLE OR MULTIPLE;  Surgeon: Vonda Antigua, MD;  Location: [facility];  Service: Gastroenterology       Current Outpatient Medications   Medication Sig Dispense Refill    acetaminophen (TYLENOL) 325 MG tablet Take 2 tablets (650 mg total) by mouth two (2) times a day as needed for pain. 180 tablet 1    aspirin (ECOTRIN) 81 MG tablet Take 1 tablet (81 mg total) by mouth daily. 90 tablet 3    blood sugar  diagnostic Strp Test two (2) times a day (30 minutes before a meal). 100 strip 1    blood-glucose meter kit Use as directed by provider. 1 each 0    buPROPion (WELLBUTRIN XL) 150 MG 24 hr tablet Take 1 tablet (150 mg total) by mouth daily.      canagliflozin (INVOKANA) 300 mg Tab tablet Take 1 tablet (300 mg total) by mouth daily before breakfast. NEED APPOINTMENT FOR FURTHER REFILLS. 90 tablet 0    carvedilol (COREG) 6.25 MG tablet Take 1 tablet (6.25 mg total) by mouth Two (2) times a day. Start with 1 tablet nightly, if tolerating well increase to twice daily 180 tablet 3    chlorhexidine (PERIDEX) 0.12 % solution       cholestyramine-aspartame (CHOLESTYRAMINE LIGHT) 4 gram PwPk Mix 1 packet as directed and take by mouth daily. 60 packet 5    citalopram (CELEXA) 40 MG tablet Take 1 tablet (40 mg total) by mouth daily.      clobetasol (TEMOVATE) 0.05 % ointment Apply the medication twice daily to stubborn areas of the skin until smooth. Then stop and re-start as the skin changes come back. 60 g 5    dulaglutide (TRULICITY) 1.5 mg/0.5 mL PnIj Inject the contents of 2 syringes (3 mg total) under the skin every seven (7) days. 4 mL 6    ferrous sulfate (SLOW FE) 142 mg (45 mg iron) TbER Take 1 tablet (142 mg) by mouth two (2) times a day. (Patient taking differently: Take 142 mg by mouth in the morning.) 60 tablet 3    furosemide (LASIX) 40 MG tablet Take 1 tablet (40 mg total) by mouth daily. 90 tablet 3    insulin glargine (LANTUS SOLOSTAR U-100 INSULIN) 100 unit/mL (3 mL) injection pen Inject 0.66 mL (66 Units total) under the skin nightly. 60 mL 1    lactulose 10 gram/15 mL solution Take 30 mL (20 g total) by mouth Three (3) times a day. 2700 mL 11    lancets Misc Use as directed to test once daily. 100 each 1    metFORMIN (GLUCOPHAGE-XR) 500 MG 24 hr tablet Take 2 tablets (1,000 mg total) by mouth in the morning and 2 tablets (1,000 mg total) in the evening. Take with meals. 360 tablet 1    pantoprazole (PROTONIX) 40 MG tablet Take 1 tablet (40 mg total) by mouth two (2) times a day. 180 tablet 1    pen needle, diabetic 32 gauge x 1/4 (6 mm) Ndle Inject 1 each under the skin nightly. 100 each 1    promethazine (PHENERGAN) 12.5 MG tablet Take 1 tablet (12.5 mg total) by mouth every eight (8) hours as needed for nausea. 60 tablet 1    rifAXIMin (XIFAXAN) 550 mg Tab Take 1 tablet (550 mg total) by mouth two (2) times a day. 60 tablet 11    secukinumab (COSENTYX PEN) 150 mg/mL PnIj injection Inject the contents of 2 pens (150 mg total) under the skin weekly for 5 weeks. Loading dose. 10 mL 0    secukinumab (COSENTYX PEN, 2 PENS,) 150 mg/mL PnIj injection Inject the contents of 2 pens (300 mg total) under the skin every twenty-eight (28) days. Maintenance dose 2 mL 5    simvastatin (ZOCOR) 20 MG tablet Take 1 tablet (20 mg total) by mouth every evening. 90 tablet 0    spironolactone (ALDACTONE) 100 MG tablet Take 1 tablet (100 mg total) by mouth daily. 30 tablet 2    triamcinolone (KENALOG)  0.1 % ointment Apply the medication twice daily to affected areas of the skin until smooth. Then stop and re-start as soon as the skin changes come back. 60 g 5    zolpidem (AMBIEN) 5 MG tablet Take 1 tablet (5 mg total) by mouth nightly as needed for sleep. 90 tablet 1     No current facility-administered medications for this visit.        No Known Allergies    Family History   Problem Relation Age of Onset    Diabetes Mother     Cancer Mother     No Known Problems Father     No Known Problems Sister     Diabetes Maternal Grandmother     Hypertension Maternal Grandmother     Cancer Maternal Grandfather     Diabetes Maternal Grandfather     No Known Problems Paternal Grandmother     Skin cancer Paternal Grandfather     Stroke Paternal Grandfather     No Known Problems Brother     No Known Problems Maternal Aunt     No Known Problems Maternal Uncle     No Known Problems Paternal Aunt     No Known Problems Paternal Uncle     No Known Problems Other     Melanoma Neg Hx     Basal cell carcinoma Neg Hx     Squamous cell carcinoma Neg Hx     Substance Abuse Disorder Neg Hx     Alcohol abuse Neg Hx     Drug abuse Neg Hx     Mental illness Neg Hx     Amblyopia Neg Hx     Blindness Neg Hx     Cataracts Neg Hx     Glaucoma Neg Hx     Macular degeneration Neg Hx     Retinal detachment Neg Hx     Strabismus Neg Hx     Thyroid disease Neg Hx     Breast cancer Neg Hx            Review of Systems -  negative except as noted in HPI.     Objective:     Physical Exam:  BP 120/60 (BP Site: R Arm, BP Position: Sitting, BP Cuff Size: Large)  - Pulse 74  - Temp 36.6 ??C (97.9 ??F) (Oral)  - Resp 20  - Ht 162.6 cm (5' 4)  - Wt 92.9 kg (204 lb 12.8 oz)  - LMP 08/03/2006  - SpO2 97%  - BMI 35.15 kg/m??   General appearance - alert, well appearing, and in no distress.  Eyes - No lid lag or stare, no proptosis, EOM's intact.  Mouth - mucous membranes moist.  Neck - supple, thyroid normal in size without nodules or tenderness.  Lymphatics - no cervical or supraclavicular adenopathy appreciated.  Chest - clear, good excursion.  Heart - normal rate, regular rhythm.  Abdomen - non-distended, soft.   Neurological - no hand tremors. Normal gait.  Extremities - extremities warm. No lower extremity edema.  Skin - warm, dry, no visible rashes.  Psych - Normal mood, appropriate affect.  Muskuloskeletal - No kyphosis or spine tenderness.      Lab Review:  I've reviewed the patient's most recent pertinent labs in the electronic record and provided records.  No results found for: TSH, T3TOTAL, T4TOTAL, FREET4, TPERXAB, THYROG2NDGEN, THYAC      No results found for: TSH, FREET4, T4TOTAL, FREET3, T3TOTAL, THYROG2NDGEN, THYIAINT, LABTHYR, THYROIDAB, THYROGLB  Lab Results   Component Value Date    CALCIUM 9.3 01/27/2023    CALCIUM 10.1 12/19/2022    CALCIUM 9.7 10/07/2022    ALBUMIN 2.6 (L) 01/27/2023    CREATININE 0.62 01/27/2023       Lab Results   Component Value Date    A1C 7.8 (A) 11/04/2022    GLU 174 (H) 01/27/2023    CREATININE 0.62 01/27/2023    ALBCRERAT  02/11/2022      Comment:      Unable to calculate.    ALBQTUR <0.3 02/11/2022    CHOL 122 09/12/2021    HDL 39 (L) 09/12/2021    NONHDL 83 09/12/2021    LDL 62 09/12/2021    TRIG 098 09/12/2021          Radiology:    Thyroid US:  No results found for this or any previous visit.     No results found for this or any previous visit.     No results found for this or any previous visit.       DEXA:    No results found for this or any previous visit.             Other Medical Data :       I've personally reviewed and summarized records in EPIC/Media and via CareEverywhere as well as medication, allergies, past medical, social, and family history.     I attest that I, Mamie Nick, personally documented this note while acting as scribe for Marvia Pickles, MD.      Mamie Nick, Scribe.  02/11/2023     The documentation recorded by the scribe accurately reflects the service I personally performed and the decisions made by me.       Marvia Pickles, MD   Electronically signed on Result date not found. 4:18 PM

## 2023-02-07 MED ORDER — LACTULOSE 10 GRAM/15 ML ORAL SOLUTION
Freq: Three times a day (TID) | ORAL | 11 refills | 30 days | Status: CP
Start: 2023-02-07 — End: ?
  Filled 2023-02-10: qty 2700, 30d supply, fill #0

## 2023-02-08 NOTE — Unmapped (Signed)
Refill request for Lactulose    LCV 01/27/23    Will authorize refill at this time    -[place]

## 2023-02-08 NOTE — Unmapped (Signed)
Refill request for Lactulose    LCV 01/27/23    Will authorize refill at this time    -Lanora Manis RN

## 2023-02-10 MED FILL — METFORMIN ER 500 MG TABLET,EXTENDED RELEASE 24 HR: ORAL | 90 days supply | Qty: 360 | Fill #1

## 2023-02-10 MED FILL — ACCU-CHEK GUIDE TEST STRIPS: 50 days supply | Qty: 100 | Fill #0

## 2023-02-11 ENCOUNTER — Ambulatory Visit
Admit: 2023-02-11 | Discharge: 2023-02-12 | Payer: PRIVATE HEALTH INSURANCE | Attending: "Endocrinology | Primary: "Endocrinology

## 2023-02-11 DIAGNOSIS — K7682 Hepatic encephalopathy (CMS-HCC): Principal | ICD-10-CM

## 2023-02-11 DIAGNOSIS — Z794 Long term (current) use of insulin: Principal | ICD-10-CM

## 2023-02-11 DIAGNOSIS — R601 Generalized edema: Principal | ICD-10-CM

## 2023-02-11 DIAGNOSIS — I8510 Secondary esophageal varices without bleeding: Principal | ICD-10-CM

## 2023-02-11 DIAGNOSIS — I2510 Atherosclerotic heart disease of native coronary artery without angina pectoris: Principal | ICD-10-CM

## 2023-02-11 DIAGNOSIS — Z8639 Personal history of other endocrine, nutritional and metabolic disease: Principal | ICD-10-CM

## 2023-02-11 DIAGNOSIS — E785 Hyperlipidemia, unspecified: Principal | ICD-10-CM

## 2023-02-11 DIAGNOSIS — E113299 Type 2 diabetes mellitus with mild nonproliferative diabetic retinopathy without macular edema, unspecified eye: Principal | ICD-10-CM

## 2023-02-11 DIAGNOSIS — K7460 Unspecified cirrhosis of liver: Principal | ICD-10-CM

## 2023-02-11 DIAGNOSIS — E113212 Type 2 diabetes mellitus with mild nonproliferative diabetic retinopathy with macular edema, left eye: Principal | ICD-10-CM

## 2023-02-11 DIAGNOSIS — E113211 Type 2 diabetes mellitus with mild nonproliferative diabetic retinopathy with macular edema, right eye: Principal | ICD-10-CM

## 2023-02-11 DIAGNOSIS — I10 Essential (primary) hypertension: Principal | ICD-10-CM

## 2023-02-11 LAB — HEMOGLOBIN A1C
ESTIMATED AVERAGE GLUCOSE: 194 mg/dL
HEMOGLOBIN A1C: 8.4 % — ABNORMAL HIGH (ref 4.8–5.6)

## 2023-02-11 LAB — ALBUMIN / CREATININE URINE RATIO
ALBUMIN QUANT URINE: 0.3 mg/dL
ALBUMIN/CREATININE RATIO: 7.7 ug/mg (ref 0.0–30.0)
CREATININE, URINE: 39.2 mg/dL

## 2023-02-11 NOTE — Unmapped (Signed)
Start taking 10 units Humalog or Novolog (depending on what insurance covers) up to 15 minutes before each meal.

## 2023-02-11 NOTE — Unmapped (Addendum)
Diabetes is not at goal. She has numerous hyperglycemic episodes, especially after eating. POCT glucose is 256. A1c is pending.    Plan:  -Start taking Novolog 10 units fifteen minutes before each meal.   -Continue taking Trulicity 3 mg weekly, metformin 1000 mg twice daily, Lantus 66 units every evening, and Invokana 300 mg daily.  -Follow up in 3 months.    Lab Results   Component Value Date    A1C 7.8 (A) 11/04/2022    A1C 9.8 (A) 09/02/2022    A1C 6.4 (A) 01/13/2022    GLU 174 (H) 01/27/2023    GLU 151 12/19/2022    GLU 94 10/07/2022    CREATININE 0.62 01/27/2023    CREATININE 0.61 12/19/2022    CREATININE 0.69 10/07/2022    ALBCRERAT  02/11/2022      Comment:      Unable to calculate.    ALBCRERAT 4.9 11/27/2020    ALBCRERAT 19.1 11/10/2019      Wt Readings from Last 6 Encounters:   02/11/23 92.9 kg (204 lb 12.8 oz)   01/27/23 97.2 kg (214 lb 3.2 oz)   01/22/23 96.2 kg (212 lb)   01/01/23 96.4 kg (212 lb 9.6 oz)   12/19/22 93.9 kg (207 lb)   11/04/22 99.3 kg (219 lb)

## 2023-02-15 NOTE — Unmapped (Signed)
Ashley Briggs requested a refill of their Xifaxan via IVR. The [facility] has scheduled delivery per the patients request via Next Day Courier to be delivered to their prescription address on 3/19.

## 2023-02-16 DIAGNOSIS — Z794 Long term (current) use of insulin: Principal | ICD-10-CM

## 2023-02-16 DIAGNOSIS — E113212 Type 2 diabetes mellitus with mild nonproliferative diabetic retinopathy with macular edema, left eye: Principal | ICD-10-CM

## 2023-02-16 MED ORDER — TRULICITY 3 MG/0.5 ML SUBCUTANEOUS PEN INJECTOR
SUBCUTANEOUS | 6 refills | 28 days
Start: 2023-02-16 — End: ?

## 2023-02-16 MED ORDER — INSULIN LISPRO (U-100) 100 UNIT/ML SUBCUTANEOUS PEN
Freq: Three times a day (TID) | SUBCUTANEOUS | 2 refills | 50 days | Status: CP
Start: 2023-02-16 — End: ?
  Filled 2023-02-19: qty 15, 50d supply, fill #0

## 2023-02-16 MED FILL — XIFAXAN 550 MG TABLET: ORAL | 28 days supply | Qty: 56 | Fill #4

## 2023-02-17 NOTE — Unmapped (Signed)
[facility] Refill Coordination Note    Specialty Medication(s) to be Shipped:   Inflammatory Disorders: Cosentyx    Other medication(s) to be shipped:  Humalog [facility] Kerriana Kaina, DOB: 12-10-1965  Phone: [phone] (home)       All above HIPAA information was verified with patient.     Was a Nurse, learning disability used for this call? No    Completed refill call assessment today to schedule patient's medication shipment from the [facility] [phone]).  All relevant notes have been reviewed.     Specialty medication(s) and dose(s) confirmed: Regimen is correct and unchanged.   Changes to medications: Perrin reports starting the following medications: Humalog  Changes to insurance: No  New side effects reported not previously addressed with a pharmacist or physician: None reported  Questions for the pharmacist: No    Confirmed patient received a Conservation officer, historic buildings and a Surveyor, mining with first shipment. The patient will receive a drug information handout for each medication shipped and additional FDA Medication Guides as required.       DISEASE/MEDICATION-SPECIFIC INFORMATION        For patients on injectable medications: Patient currently has 0 doses left.  Next injection is scheduled for 02/25/23.    SPECIALTY MEDICATION ADHERENCE     Medication Adherence    Patient reported X missed doses in the last month: 0  Specialty Medication: Cosentyx  150mg /mL  Patient is on additional specialty medications: No  Patient is on more than two specialty medications: No  Any gaps in refill history greater than 2 weeks in the last 3 months: no  Demonstrates understanding of importance of adherence: yes  Informant: patient  Reliability of informant: reliable  Provider-estimated medication adherence level: good  Patient is at risk for Non-Adherence: No  Reasons for non-adherence: no problems identified              Were doses missed due to medication being on hold? No    COSENTYX PEN (2 PENS) 150 mg/mL Pnij injection (secukinumab)  : 0 days of medicine on hand       REFERRAL TO PHARMACIST     Referral to the pharmacist: Not needed      [facility]     Shipping address confirmed in Epic.     Patient was notified of new phone menu : No    Delivery Scheduled: Yes, Expected medication delivery date: 02/19/23.     Medication will be delivered via Same Day Courier to the prescription address in Epic WAM.    Carla Whilden' [facility] Specialty Technician

## 2023-02-18 ENCOUNTER — Ambulatory Visit: Admit: 2023-02-18 | Discharge: 2023-02-18 | Payer: PRIVATE HEALTH INSURANCE

## 2023-02-19 MED FILL — COSENTYX PEN 300 MG/2 PENS (150 MG/ML) SUBCUTANEOUS: SUBCUTANEOUS | 28 days supply | Qty: 2 | Fill #2

## 2023-02-27 ENCOUNTER — Ambulatory Visit
Admit: 2023-02-27 | Discharge: 2023-02-28 | Payer: PRIVATE HEALTH INSURANCE | Attending: Student in an Organized Health Care Education/Training Program | Primary: Student in an Organized Health Care Education/Training Program

## 2023-02-27 DIAGNOSIS — R2681 Unsteadiness on feet: Principal | ICD-10-CM

## 2023-02-27 DIAGNOSIS — R590 Localized enlarged lymph nodes: Principal | ICD-10-CM

## 2023-02-27 DIAGNOSIS — R296 Repeated falls: Principal | ICD-10-CM

## 2023-02-27 DIAGNOSIS — R519 Daily headache: Principal | ICD-10-CM

## 2023-02-27 DIAGNOSIS — R4189 Other symptoms and signs involving cognitive functions and awareness: Principal | ICD-10-CM

## 2023-02-27 DIAGNOSIS — R413 Other amnesia: Principal | ICD-10-CM

## 2023-02-27 NOTE — Unmapped (Signed)
Ashley Briggs  [birth year]    Assessment & Plan:  57 yo female presents today for more acute concerns rather than chronic care follow up.     #Daily headache, Recurrent Falls, Unsteadiness, Memory Deficits, Cognitive Decline, Hepatic Encephalopathy   -She presented with her friend/cousin today who noted that Ashley Briggs has not only had more recurrent falls but also appears to be having a decline in her cognitive function.   -On today's exam there are no neurodeficits noted. However we discussed in depth regarding this concern of constant daily headaches and now having more falls  -We discussed again in depth regarding emergency room precautions and advised that she go to the ED if she falls again. She verbalized agreement and understanding to this.   -I am unsure what is causing her worsening, daily headaches/her neuro symptoms---there is a concern polypharmacy could be contributing. Will place referral to [facility] (she is now getting Ambien from her psychiatrist. We will not refill pain medication)   -She does have cirrhosis which could also be a factor with this. Recent labs from 01/27/2023 are reassuring as LFT and renal function are WNL. Will obtain Ammonia level (she noted to have somewhat poor compliance to Lactulose)  -We will additionally attempt to obtain MRI/MRA of her brain to further assess what could be causing her symptoms. With this, we discussed increased risk to radiation as well as unable to predict cost of imaging. She DOES wish to proceed with obtaining these imaging modalities.  -She does follow with Neurosurgery additionally.   -Neurology appointment 7/2/20024  -She was advised by GI to refrain from driving. However she does continue to drive. I told her that I agree with GI recommendations of not driving given her neuro concerns/symptoms    #Cervical lymphadenopathy, Lichen Planus Dysphagia  -Neck Ultrasound (02/18/2023):   At the palpable area of concern on the right there is normal-appearing submandibular gland and a 0.72 cm lymph node with normal fatty hilum.      At the palpable area of concern on the left there is a normal-appearing submandibular gland and multiple lymph nodes measuring up to 0.9 cm in short axis with normal fatty hila.   -EGD bx(01/22/23):   - Fragments of esophageal squamous epithelium with increased intraepithelial lymphocytic infiltrate, nonspecific; cannot exclude lichenoid esophagitis in the appropriate clinical setting  -We will obtain CT Neck with contrast as well as refer to [place] [facility] (previously referred to ENT with [facility] but they were not able to reach patient)  -GI did place [facility] referral as well. She should follow up with [facility] as well as her oral surgeon.       Return in about 6 weeks (around 04/10/2023).  Emergency room precautions were discussed multiple times throughout the appointment     I personally spent >30 minutes face-to-face and non-face-to-face in the care of this patient, which includes all pre, intra, and post visit time on the date of service.  All documented time was specific to the E/M visit and does not include any procedures that may have been performed.     Subjective:  Subjective    HPI: Ashley Briggs is a 57 y.o. female here for    Answers submitted by the patient for this visit:  Neurological Problem Questionnaire (Submitted on 02/26/2023)  Chief Complaint: Neurologic complaint  altered mental status: Yes  clumsiness: Yes  focal sensory loss: No  focal weakness: No  loss of balance: Yes  memory loss: Yes  near-syncope: Yes  slurred speech: No  syncope: No  visual change: Yes  weakness: Yes  Chronicity: recurrent  Onset: more than 1 month ago  Onset quality: gradually  Progression since onset: rapidly worsening  abdominal pain: Yes  auditory change: No  aura: Yes  back pain: Yes  bladder incontinence: Yes  bowel incontinence: Yes  chest pain: Yes  confusion: Yes  diaphoresis: No  dizziness: Yes  fatigue: Yes  fever: Yes  headaches: Yes  light-headedness: Yes  nausea: Yes  neck pain: No  palpitations: Yes  vertigo: Yes  vomiting: Yes  Treatments tried: acetaminophen, bed rest, walking  Improvement on treatment: no relief    -Stabbing awful pain in the temples   -She said that the oral surgeon 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    -Her BG are very variable   -Her cognitive are delayed per friend in room.   She notes that the hepatologist told her not to drive.     She was not taking the lactulose as prescribed but is now    ROS:  Review of systems negative unless otherwise noted as per HPI.    Objective:  Objective    Vitals:    02/27/23 1506   BP: 118/70   Pulse: 80     Body mass index is 35.38 kg/m??.    Wt Readings from Last 3 Encounters:   02/27/23 93.5 kg (206 lb 1.6 oz)   02/11/23 92.9 kg (204 lb 12.8 oz)   01/27/23 97.2 kg (214 lb 3.2 oz)        Physical Exam:  Const: in no acute distress, not toxic or ill appearing   HEENT: Wearing glasses. No nasal congestion present. NO carotid bruits on exam. She does still appear to have enlargement of submandibular region on right side.   Cardiac: RRR  Lungs: CTAB  MSK: Gait is stable, not using cane/walker for ambulation  Neuro: Aox3. CN II-XII intact. No neurodeficits on exam  Psych: Mood is normal
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
## 2023-03-02 ENCOUNTER — Ambulatory Visit: Admit: 2023-03-02 | Discharge: 2023-03-03 | Payer: PRIVATE HEALTH INSURANCE

## 2023-03-02 LAB — AMMONIA: AMMONIA: 75 umol/L — ABNORMAL HIGH (ref 11–32)

## 2023-03-02 NOTE — Unmapped (Signed)
[facility] Assessment of [facility]ations Program (CAMP)  Referral RECRUITMENT SUMMARY NOTE     Medicaid/CMM/polypharm/high risk meds; referred by PCP Benison Pap Mangel, DO     Patient was outreached for [employer]. Unable to contact- left message.      Theora Master, CPhT  Certified Pharmacy Technician    Assessment of Medications Program (CAMP)   P [phone], F [phone]

## 2023-03-03 ENCOUNTER — Ambulatory Visit (INDEPENDENT_AMBULATORY_CARE_PROVIDER_SITE_OTHER): Payer: Medicaid Other | Admitting: Psychiatry

## 2023-03-03 ENCOUNTER — Encounter: Payer: Self-pay | Admitting: Psychiatry

## 2023-03-03 VITALS — BP 116/65 | HR 88 | Temp 97.1°F | Ht 64.0 in | Wt 207.0 lb

## 2023-03-03 DIAGNOSIS — F4010 Social phobia, unspecified: Secondary | ICD-10-CM | POA: Diagnosis not present

## 2023-03-03 DIAGNOSIS — F331 Major depressive disorder, recurrent, moderate: Secondary | ICD-10-CM

## 2023-03-03 DIAGNOSIS — G4700 Insomnia, unspecified: Secondary | ICD-10-CM

## 2023-03-03 MED ORDER — MIRTAZAPINE 7.5 MG PO TABS: 7.5000 mg | ORAL_TABLET | Freq: Every day | ORAL | 1 refills | Status: DC

## 2023-03-03 NOTE — Unmapped (Signed)
[facility] Assessment of Medications Program (CAMP)  Referral RECRUITMENT SUMMARY NOTE     Patient was outreached for CAMP Services. Unable to contact- left message.x2    Medicaid/CMM/polypharm/high risk meds; referred by PCP Benison Pap Mangel, DO     Jerolyn Center, CPhT  Certified Pharmacy Technician   [place] Assessment of Medications Program (CAMP)   P [phone], F [phone]

## 2023-03-03 NOTE — Patient Instructions (Signed)
Continue bupropion 150 mg daily  Continue citalopram 40 mg daily Start mirtazapine 7.5 mg at night  Continue Ambien 5 mg at night as needed for insomnia- Referred to therapy (wait list) Referral for evaluation of sleep apnea Next appointment: 5/30 at 11 am

## 2023-03-06 ENCOUNTER — Ambulatory Visit
Admit: 2023-03-06 | Discharge: 2023-03-07 | Payer: PRIVATE HEALTH INSURANCE | Attending: Female Pelvic Medicine and Reconstructive Surgery | Primary: Female Pelvic Medicine and Reconstructive Surgery

## 2023-03-06 DIAGNOSIS — K5901 Slow transit constipation: Principal | ICD-10-CM

## 2023-03-06 DIAGNOSIS — N3281 Overactive bladder: Principal | ICD-10-CM

## 2023-03-06 DIAGNOSIS — R102 Pelvic and perineal pain: Principal | ICD-10-CM

## 2023-03-06 NOTE — Unmapped (Signed)
[facility]  New Patient Evaluation & Consultation    Referring Provider: Desmond Dike, *  PCP: Karie Georges Pap, DO  Date of Service: 03/06/2023    SUBJECTIVE  Chief Complaint: Pelvic Pain    History of Present Illness: Ashley Briggs is a 57 y.o. White female seen in consultation at the request of Dr. Nile Dear for evaluation of lower abdominal pain  .    She reports lower abdominal/pelvic pain after voiding or BMs. Feels a ripping sensation. She denies burning with urination. She has had this pain for the past year+.     She was prescribed mirabegron but let the prescription expire. She reports some improvements.     Drinks Diet Dr. Reino Kent x 4, and unsweetened herbal tea, 2 cups of plain water. She also takes a direutic.     She gets up 6-10 times a night     Review of records significant for:  Hemoglobin A1c 8.4     03/2022   Abdominal Pain       Suprapubic pressure and pain for about 2 weeks.   Patient states that it feels like something is ripping out.        Urinary Symptoms:  Stress urinary incontinence: yes.   Urgency urinary incontinence: yes. Tried mirabegron in the past   Leaks 1-2 time(s) per day(s).   Pad use: none  She is bothered by her UI symptoms.    Nocturia: 6-10 times per night to void.  Splinting to void: no     UTIs: 0 UTI's in the last year.    She denies any history of kidney stones.     Pelvic Organ Prolapse Symptoms:                   She denies a feeling of a bulge the vaginal area.   She denies seeing a bulge.       Bowel Symptom:  Bowel movements: 1-2 time(s) per day(s).    Stool consistency: varies.    She admits to accidental bowel leakage / fecal incontinence: 2-3x /month-- takes lactulose   Straining: yes.   Splinting: no.   Incomplete evacuation: no.   Fecal urgency: yes.     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     Past Surgical History: She  has a past surgical history that includes Hysterectomy; pr anal pressure record (Left, 03/31/2013); pr breath hydrogen test (N/A, 03/31/2013); pr upper gi endoscopy,biopsy (N/A, 04/15/2013); Hysterectomy; Cholecystectomy; pr colsc flx w/rmvl of tumor polyp lesion snare tq (N/A, 09/06/2020); pr upper gi endoscopy,biopsy (N/A, 09/06/2020); Oophorectomy; pr upper gi endoscopy,biopsy (N/A, 05/05/2022); and pr upper gi endoscopy,biopsy (N/A, 01/22/2023).     Past OB/GYN History:  G{NUMBERS 1-10:18281} P{NUMBERS 1-10:18281}  Vaginal deliveries: {NUMBERS 1-10:18281}  Cesarean section: {NUMBERS 1-10:18281}    She {is/not:21341} menopausal.    She {is/is not:19887} sexually active.   She {DOES /DOES NOT:25409:a} have dyspareunia.  Contraception: ***.  Last pap smear was ***.  Any history of abnormal pap smears: {yes/no:19897}.  Last colonoscopy was ***.     Medications: She has a current medication list which includes the following prescription(s): acetaminophen, aspirin, blood sugar diagnostic, blood-glucose meter, bupropion, canagliflozin, carvedilol, chlorhexidine, cholestyramine-aspartame, citalopram, clobetasol, trulicity, slow  fe, furosemide, insulin glargine, insulin lispro, lactulose, lancets, metformin, pantoprazole, pen needle, diabetic, promethazine, rifaximin, cosentyx pen, cosentyx pen (2 pens), simvastatin, spironolactone, triamcinolone, and zolpidem.   Allergies: Patient has No Known Allergies.     Social History: Patient  reports that she has never smoked. She has been exposed to tobacco smoke. She has never used smokeless tobacco. She reports that she does not currently use alcohol. She reports that she does not currently use drugs. She lives with ***.  She 03/31/2013); pr upper gi endoscopy,biopsy (N/A, 04/15/2013); Hysterectomy; Cholecystectomy; pr colsc flx w/rmvl of tumor polyp lesion snare tq (N/A, 09/06/2020); pr upper gi endoscopy,biopsy (N/A, 09/06/2020); Oophorectomy; pr upper gi endoscopy,biopsy (N/A, 05/05/2022); and pr upper gi endoscopy,biopsy (N/A, 01/22/2023).     Past OB/GYN History:  G0      She is menopausal.    She is not sexually active.   She does not have dyspareunia.  Contraception: n/a.  Last pap smear was prior to hyst.  Any history of abnormal pap smears: no.  Last colonoscopy was 2023.     Medications: She has a current medication list which includes the following prescription(s): acetaminophen, aspirin, blood sugar diagnostic, bupropion, canagliflozin, carvedilol, chlorhexidine, cholestyramine-aspartame, citalopram, clobetasol, trulicity, slow fe, furosemide, insulin glargine, insulin lispro, lactulose, lancets, metformin, pantoprazole, pen needle, diabetic, promethazine, rifaximin, cosentyx pen (2 pens), simvastatin, spironolactone, triamcinolone, zolpidem, blood-glucose meter, and cosentyx pen.   Allergies: Patient has No Known Allergies.     Social History: Patient  reports that she has never smoked. She has been exposed to tobacco smoke. She has never used smokeless tobacco. She reports that she does not currently use alcohol. She reports that she does not currently use drugs. She lives with cousin.  She is not employed.  Family History: family history includes Cancer in her maternal grandfather and mother; Diabetes in her maternal grandfather, maternal grandmother, and mother; Hypertension in her maternal grandmother; No Known Problems in her brother, father, maternal aunt, maternal uncle, paternal aunt, paternal grandmother, paternal uncle, sister, and another family member; Skin cancer in her paternal grandfather; Stroke in her paternal grandfather.     Review of Systems: Positive per HPI.  Otherwise a 10 system review of systems was {no/noted:25674} enterocoele, no rectal masses, {no sign of/noted:25675} dyssynergia when asking the patient to bear down.    Post-Void Residual (PVR) by Bladder Scan:  In order to evaluate bladder emptying, we discussed obtaining a postvoid residual and she agreed to this procedure.    Procedure: The ultrasound unit was placed on the patient???s abdomen in the suprapubic region after the patient had voided. A PVR of *** ml was obtained by bladder scan.    Laboratory Results:  No results found for this visit on 03/06/23.     ***I visualized the urine specimen, noting the specimen to be {urine color:24444:a::1}    ASSESSMENT AND PLAN  Ms. Hagel is a 57 y.o. with: No diagnosis found.    ***        No follow-ups on file.      ***    Medical Decision Making - Amount and Complexity of Data  1 point: I reviewed and/or ordered clinical laboratory tests. (ie CPT 8XXXX series)  1 point: I reviewed and/or ordered radiology tests. (ie CPT 7XXXX series)  1 point: I reviewed and/or ordered medicine tests. (ie CPT 9XXXX series)     1 point: I discussed the test results with performing physician  1 point: I decided to obtain the patient's  old medical records and/or obtaining history someone from other than the patient.    2 points: I reviewed and summarized the old records and/or obtained history from someone other than the patient and/or discussion of case with another healthcare provider  2 points: I independently visualized the image, tracing or specimen . Esterase/POC Negative Negative    Nitrite/POC Negative Negative    Protein/POC Negative Negative    UA Glucose/POC 3+ (A) Negative    Ketones, POC Negative Negative    Bilirubin/POC Negative Negative    Blood/POC Trace-intact (A) Negative    Urobilinogen/POC 1.0 0.2 - 1.0 mg/dL        I visualized the urine specimen, noting the specimen to be urine color: clear yellow    ASSESSMENT AND PLAN  Ms. Klinkner is a 57 y.o. with:   1. Pelvic pain in female    2. Slow transit constipation    3. OAB (overactive bladder)        Pelvic pain: No evidence of pelvic floor myalgia oir pelvic organ prolapse on exam. Discussed that pelvic floor PT may still help with the pain she is experiencing.     Constipation: Stool noted when palpating in the rectum through the vagina. Discussed starting miralax. Pelvic floor PT.    OAB: Discussed improving bladder diet.     Return if symptoms worsen or fail to improve.      Medical Decision Making - Amount and Complexity of Data   I reviewed and/or ordered clinical laboratory tests.   I reviewed and summarized the old records    I independently visualized the image, tracing or specimen .

## 2023-03-06 NOTE — Unmapped (Addendum)
Plan for today:  1) Improve constipation, handout provided   2) Referral to pelvic floor physical therapy     [facility]   [address] [place], [place] [address]  P: [phone]  F: [phone]

## 2023-03-08 DIAGNOSIS — Z794 Long term (current) use of insulin: Principal | ICD-10-CM

## 2023-03-08 DIAGNOSIS — E785 Hyperlipidemia, unspecified: Principal | ICD-10-CM

## 2023-03-08 DIAGNOSIS — E113212 Type 2 diabetes mellitus with mild nonproliferative diabetic retinopathy with macular edema, left eye: Principal | ICD-10-CM

## 2023-03-08 MED ORDER — CANAGLIFLOZIN 300 MG TABLET
ORAL_TABLET | Freq: Every day | ORAL | 0 refills | 90 days
Start: 2023-03-08 — End: 2024-03-07

## 2023-03-08 MED ORDER — SIMVASTATIN 20 MG TABLET
ORAL_TABLET | Freq: Every evening | ORAL | 0 refills | 90 days
Start: 2023-03-08 — End: 2024-03-07

## 2023-03-09 MED ORDER — SIMVASTATIN 20 MG TABLET
ORAL_TABLET | Freq: Every evening | ORAL | 0 refills | 90 days | Status: CP
Start: 2023-03-09 — End: 2024-03-08
  Filled 2023-05-28: qty 90, 90d supply, fill #0

## 2023-03-09 NOTE — Unmapped (Signed)
Patient is requesting the following refill  Requested Prescriptions     Pending Prescriptions Disp Refills    simvastatin (ZOCOR) 20 MG tablet 90 tablet 0     Sig: Take 1 tablet (20 mg total) by mouth every evening.       Recent Visits  Date Type Provider Dept   02/27/23 Office Visit Mangel, Benison Pap, DO [facility] [address] [facility]   01/01/23 Office Visit Mangel, Benison Pap, DO [place] [facility] [address] [facility]   11/04/22 Office Visit Mangel, Benison Pap, DO [place] [facility] [address] At [facility]   10/16/22 Office Visit Mangel, Benison Pap, DO [place] [facility] [address] [facility]   09/17/22 Office Visit Mangel, Benison Pap, DO [place] [facility] [address] [facility]   09/02/22 Office Visit Johnn Hai, Loleta Rose, FNP [place] [facility] [address] [facility]   08/06/22 Office Visit Johnn Hai, Loleta Rose, FNP [place] [facility] [address] At [facility]   Showing recent visits within past 365 days with a meds authorizing provider and meeting all other requirements  Future Appointments  Date Type Provider Dept   04/10/23 Appointment Mangel, Benison Pap, DO [place] [facility] [address] At [facility]   Showing future appointments within next 365 days with a meds authorizing provider and meeting all other requirements       Labs: Cholesterol:   Cholesterol (mg/dL)   Date Value   16/09/9603 122   ,   Triglycerides (mg/dL)   Date Value   54/08/8118 106   ,   HDL (mg/dL)   Date Value   14/78/2956 39 (L)   ,   LDL Calculated (mg/dL)   Date Value   21/30/8657 62

## 2023-03-09 NOTE — Unmapped (Signed)
Needs to be refilled by endocrinology

## 2023-03-10 MED FILL — PANTOPRAZOLE 40 MG TABLET,DELAYED RELEASE: ORAL | 90 days supply | Qty: 180 | Fill #1

## 2023-03-10 MED FILL — LANTUS SOLOSTAR U-100 INSULIN 100 UNIT/ML (3 ML) SUBCUTANEOUS PEN: SUBCUTANEOUS | 90 days supply | Qty: 60 | Fill #1

## 2023-03-10 MED FILL — ACCU-CHEK SOFTCLIX LANCETS: 90 days supply | Qty: 100 | Fill #1

## 2023-03-10 MED FILL — SPIRONOLACTONE 100 MG TABLET: ORAL | 30 days supply | Qty: 30 | Fill #1

## 2023-03-12 MED ORDER — LANCETS
1 refills | 0 days | Status: CP
Start: 2023-03-12 — End: 2024-03-12
  Filled 2023-05-28: qty 100, 90d supply, fill #0

## 2023-03-17 ENCOUNTER — Ambulatory Visit: Admit: 2023-03-17 | Discharge: 2023-03-18 | Payer: PRIVATE HEALTH INSURANCE

## 2023-03-17 DIAGNOSIS — L409 Psoriasis, unspecified: Principal | ICD-10-CM

## 2023-03-17 DIAGNOSIS — Z79899 Other long term (current) drug therapy: Principal | ICD-10-CM

## 2023-03-17 MED ORDER — CLOBETASOL 0.05 % TOPICAL OINTMENT
OPHTHALMIC | 5 refills | 0.00000 days | Status: CP
Start: 2023-03-17 — End: ?

## 2023-03-17 MED FILL — XIFAXAN 550 MG TABLET: ORAL | 28 days supply | Qty: 56 | Fill #5

## 2023-03-17 MED FILL — ASPIRIN 81 MG TABLET,DELAYED RELEASE: ORAL | 90 days supply | Qty: 90 | Fill #2

## 2023-03-17 MED FILL — CARVEDILOL 6.25 MG TABLET: ORAL | 90 days supply | Qty: 180 | Fill #3

## 2023-03-17 NOTE — Unmapped (Unsigned)
Dermatology Note     Assessment and Plan:      Plaque psoriasis, trunk and extremities, previously ~30% BSA, improving on Cosentyx but still with ~10% BSA  Previously on Cosentyx starting in 09/2019 with improvement, then stopped for about 4-5 months and restarted in May 2022 without loading doses for another 6 months. No improvement on Ustekinumab [name]) from Jan 2023 to August 2023. No improvement on adalimumab (Humira) from Aug 2023 to October 2023. Restarted costenyx 08/2022.  - Patient states that when she was on Cosentyx as directed (without any missed doses) it was very effective  - Due to recent diagnosis of cirrhosis, will contact liver doctor Dr. Eudelia Bunch in coordinating care. Sent staff message to GI team. They are ok with Cosentyx dosing.  - Has known osteoarthritis in knees, no other joint pain. Has been seen by a rheumatologist, who diagnosed her with osteoarthritis of bilateral knees, and does not think the patient has psoriatic arthritis.  - continue Cosentyx 300mg  monthly (starting following last loading dose)  - continue clobetasoL (TEMOVATE) 0.05 % ointment; Apply the medication twice daily to stubborn areas of the skin until smooth. Then stop and re-start as the skin changes come back.  - continue triamcinolone (KENALOG) 0.1 % ointment; Apply the medication twice daily to affected areas of the skin until smooth. Then stop and re-start as soon as the skin changes come back.  - Start phototherapy    NEW patient  Diagnosis: Psoriasis  Skin Type:   II  Treatment Type: UVB- high- psoriasis, etc  Treatment Area:  hands and total body except face   Starting dose/MED/ Titration:per protocol for skin type  Frequency:three times a week  Return for appointment in:  10 weeks    High risk medication use, Cosentyx  - Negative quant gold and hepatitis panel on 06/16/22    The patient was advised to call for an appointment should any new, changing, or symptomatic lesions develop.     RTC: Return in about 10 weeks (around 05/26/2023). or sooner as needed   _________________________________________________________________      Chief Complaint     Chief Complaint   Patient presents with    Psoriasis     Area of concern right hand x 2 months Dry cracks and bleeds       HPI     Ashley Briggs is a 57 y.o. female who presents as a returning patient (last seen 02/02/2023) to Dermatology for follow up of psoriasis.     Today patient reports:  - psoriasis is about the same as last visit  - hands are the most bothersome, currently using clobetasol  - cracking and dryness of hands is the most painful    The patient denies any other new or changing lesions or areas of concern.     Pertinent Past Medical History     Past Medical History, Family History, Social History, Medication List, Allergies, and Problem List were reviewed in the rooming section of Epic.     ROS: Other than symptoms mentioned in the HPI, no fevers, chills, or other skin complaints    Physical Examination     GENERAL: Well-appearing female in no acute distress, resting comfortably.  NEURO: Alert and oriented, answers questions appropriately  SKIN: Examination of the chest, abdomen, back, bilateral upper extremities, and hands was performed    - Well circumscribed erythematous papules and plaques with overlying silvery scale on flexural surfaces including elbows and trunk, improving  - Well-demarcated verrucous,  hyperkeratotic papule(s) located on the L third digit  - Xerosis of hands with desquamation and fissuring    All areas not commented on are within normal limits or unremarkable    (Approved Template 08/13/2020)

## 2023-03-18 ENCOUNTER — Ambulatory Visit (INDEPENDENT_AMBULATORY_CARE_PROVIDER_SITE_OTHER): Payer: Medicaid Other

## 2023-03-18 ENCOUNTER — Ambulatory Visit
Admission: EM | Admit: 2023-03-18 | Discharge: 2023-03-18 | Disposition: A | Discharge status: Home / Self Care | Payer: Medicaid Other | Attending: Family Medicine | Admitting: Family Medicine

## 2023-03-18 DIAGNOSIS — S42201A Unspecified fracture of upper end of right humerus, initial encounter for closed fracture: Secondary | ICD-10-CM | POA: Diagnosis not present

## 2023-03-18 MED ORDER — KETOROLAC TROMETHAMINE 60 MG/2ML IM SOLN
30.0000 mg | Freq: Once | INTRAMUSCULAR | Status: AC
  Administered 2023-03-18: 30 mg via INTRAMUSCULAR

## 2023-03-18 MED ORDER — HYDROCODONE-ACETAMINOPHEN 5-325 MG PO TABS: 1.0000 | ORAL_TABLET | Freq: Four times a day (QID) | ORAL | 0 refills | Status: AC | PRN

## 2023-03-18 NOTE — Unmapped (Signed)
[facility] Refill Coordination Note    Ashley Briggs, [name]: [birth year]  Phone: [phone] (home)       All above HIPAA information was verified with patient.         03/17/2023     5:07 PM   Specialty Rx Medication Refill Questionnaire   Which Medications would you like refilled and shipped? Xixfan, Cosyntex   Please list all current allergies: None   Have you missed any doses in the last 30 days? No   Have you had any changes to your medication(s) since your last refill? No   How many days remaining of each medication do you have at home? Xixfan 2 Cosyntex  is due for monthly injection   If receiving an injectable medication, next injection date is 09-11-1966   Have you experienced any side effects in the last 30 days? No   Please enter the full address (street address, city, state, zip code) where you would like your medication(s) to be delivered to. [address], [place],  [place] [address]   Please specify on which day you would like your medication(s) to arrive. Note: if you need your medication(s) within 3 days, please call the pharmacy to schedule your order at [phone]  03/20/2023   Has your insurance changed since your last refill? No   Would you like a pharmacist to call you to discuss your medication(s)? No   Do you require a signature for your package? (Note: if we are billing Medicare Part B or your order contains a controlled substance, we will require a signature) No         Completed refill call assessment today to schedule patient's medication shipment from the [facility] [phone]).  All relevant notes have been reviewed.       Confirmed patient received a Conservation officer, historic buildings and a Surveyor, mining with first shipment. The patient will receive a drug information handout for each medication shipped and additional FDA Medication Guides as required.         REFERRAL TO PHARMACIST     Referral to the pharmacist: Not needed      [facility] Shipping address confirmed in Epic.     Delivery Scheduled: Yes, Expected medication delivery date: 03/20/2023.     Medication will be delivered via Same Day Courier to the prescription address in Epic WAM.    Ashley Briggs   [facility] Specialty Technician

## 2023-03-18 NOTE — Discharge Instructions (Addendum)
You have a fracture of your right shoulder bone (humerus). Go to [facility] for your proximal right humerus surgical neck fracture also involving the greater tuberosity.

## 2023-03-18 NOTE — ED Provider Notes (Signed)
[facility]    CSN: [id number] Arrival date & time: 03/18/23  1526      History   Chief Complaint Chief Complaint  Patient presents with   Fall    HPI  HPI Abigail Molina is a 57 y.o. female.   Abigail Molina presents after a fall for right arm pain. She has balance issues. She walking her dog around 3 PM today and her dog scared then pulled her. She fell with impact to her right arm.  She has difficulty moving her right arm. Has more pain in her right shoulder that radiates down to her forearm. No treatments prior to arrival. She did not hit her head or have loss of consciousness.  Denies neck pain, back pain, vomiting or leg pain. Endorses nausea.       Past Medical History:  Diagnosis Date   Anemia    Anxiety    Arthritis    Cancer    Endometrial cancer   Chronic pain    Cirrhosis of liver    Depression    Diabetes mellitus without complication    Fatty liver disease, nonalcoholic 2021   GERD (gastroesophageal reflux disease)    Glaucoma    Hyperlipidemia    Hypertension    Meningioma    Overactive bladder    Psoriasis 2015    Patient Active Problem List   Diagnosis Date Noted   Abdominal pain 03/04/2018   Diabetes 12/12/2015   Hyperlipidemia 12/12/2015   Hypertension 05/23/2015   Acid reflux 05/23/2015    Past Surgical History:  Procedure Laterality Date   ABDOMINAL HYSTERECTOMY  2007   CHOLECYSTECTOMY  2000    OB History   No obstetric history on file.      Home Medications    Prior to Admission medications   Medication Sig Start Date End Date Taking? Authorizing Provider  aspirin 81 MG tablet Take 81 mg by mouth daily.   Yes [provider]  canagliflozin (INVOKANA) 300 MG TABS tablet Take by mouth. 02/17/22  Yes [provider]  carvedilol (COREG) 6.25 MG tablet Take 6.25 mg by mouth 2 (two) times daily. 06/19/22  Yes [provider]  COSENTYX SENSOREADY, 300 MG, 150 MG/ML SOAJ Inject into the skin.   Yes  [provider]  Dulaglutide (TRULICITY) 1.5 MG/0.5ML SOPN Inject 1.5 mg into the skin once a week. Thursdays   Yes [provider]  Ferrous Sulfate (SLOW FE) 142 (45 Fe) MG TBCR Take by mouth. 09/12/21  Yes [provider]  furosemide (LASIX) 20 MG tablet Take 20 mg by mouth daily.   Yes [provider]  HYDROcodone-acetaminophen (NORCO/VICODIN) 5-325 MG tablet Take 1-2 tablets by mouth every 6 (six) hours as needed for up to 3 days. 03/18/23 03/21/23 Yes Haseeb Fiallos, DO  Insulin Pen Needle 32G X 6 MM MISC 1 Syringe by Does not apply route. Use with Victoza.   Yes [provider]  lactulose (CHRONULAC) 10 GM/15ML solution SMARTSIG:Milliliter(s) By Mouth 06/19/22  Yes [provider]  LANTUS SOLOSTAR 100 UNIT/ML Solostar Pen Inject 66 Units into the skin daily. 11/25/22 11/25/23 Yes [provider]  metFORMIN (GLUCOPHAGE-XR) 500 MG 24 hr tablet Take 1,000 mg by mouth 2 (two) times daily.   Yes [provider]  mirtazapine (REMERON) 7.5 MG tablet Take 1 tablet (7.5 mg total) by mouth at bedtime. 03/03/23 05/02/23 Yes Hisada, Barbee Cough, MD  pantoprazole (PROTONIX) 40 MG tablet TAKE ONE TABLET BY MOUTH EVERY DAY 10/24/19  Yes McGowan, Shannon A, PA-C  promethazine (PHENERGAN) 12.5 MG tablet Take by mouth. 05/23/22  Yes [provider]  simvastatin (ZOCOR) 20 MG tablet TAKE ONE TABLET BY MOUTH EVERY EVENING 10/24/19  Yes McGowan, Carollee Herter A, PA-C  spironolactone (ALDACTONE) 100 MG tablet Take 100 mg by mouth 2 (two) times daily.   Yes [provider]  triamcinolone ointment (KENALOG) 0.1 % Apply topically.   Yes [provider]  XIFAXAN 550 MG TABS tablet Take 550 mg by mouth 2 (two) times daily.   Yes [provider]  buPROPion (WELLBUTRIN XL) 150 MG 24 hr tablet Take 1 tablet (150 mg total) by mouth daily. 01/20/23 02/19/23  Neysa Hotter, MD  citalopram (CELEXA) 40 MG tablet Take 1 tablet (40 mg total) by  mouth every morning. 10/30/22 01/28/23  Neysa Hotter, MD  zolpidem (AMBIEN) 5 MG tablet Take 1 tablet (5 mg total) by mouth at bedtime as needed for sleep. 10/07/22 01/05/23  Neysa Hotter, MD    Family History Family History  Problem Relation Age of Onset   Diabetes Mother    Hypertension Mother    Depression Mother    Obesity Mother    Vision loss Mother    Alcohol abuse Cousin    Bipolar disorder Cousin    Heart disease Maternal Grandfather    Alcohol abuse Maternal Grandmother    Stroke Paternal Grandfather     Social History Social History   Tobacco Use   Smoking status: Never   Smokeless tobacco: Never  Vaping Use   Vaping Use: Never used  Substance Use Topics   Alcohol use: Not Currently    Comment:  Rarely. One or two drinks a year    Drug use: No     Allergies   Patient has no known allergies.   Review of Systems Review of Systems: :negative unless otherwise stated in HPI.      Physical Exam Triage Vital Signs ED Triage Vitals  Enc Vitals Group     BP --      Pulse --      Resp --      Temp --      Temp src --      SpO2 --      Weight 03/18/23 1535 202 lb (91.6 kg)     Height 03/18/23 1535  (1.626 m)     Head Circumference --      Peak Flow --      Pain Score 03/18/23 1534 10     Pain Loc --      Pain Edu? --      Excl. in GC? --    No data found.  Updated Vital Signs BP (!) 117/59 (BP Location: Left Arm)   Pulse 82   Temp 98.3 F (36.8 C) (Oral)   Ht  (1.626 m)   Wt 91.6 kg   LMP 10/15/2016   SpO2 99%   BMI 34.67 kg/m   Visual Acuity Right Eye Distance:   Left Eye Distance:   Bilateral Distance:    Right Eye Near:   Left Eye Near:    Bilateral Near:     Physical Exam GEN: well appearing female in no acute distress  NECK: good ROM  CVS: well perfused, no chest wall tenderness   RESP: speaking in full sentences without pause, no respiratory distress  MSK:  Right upper extremity   No evidence of bony  deformity Anterior and lateral shoulder tenderness, tenderness of  humerus that extends to elbow.  Limited ROM of shoulder and elbow. Normal ROM of wrist and hand. No appreciable swelling, bruising or redness.  Strength testing not assessed due to acute pain.  Sensation intact. Peripheral pulses intact. Baseline ROM of hips, knee and ankle. Baseline ROM of left shoulder  SKIN: warm, dry   UC Treatments / Results  Labs (all labs ordered are listed, but only abnormal results are displayed) Labs Reviewed - No data to display  EKG   Radiology DG Shoulder Right  Result Date: 03/18/2023 CLINICAL DATA:  fall, pain EXAM: RIGHT SHOULDER - 2+ VIEW COMPARISON:  None available FINDINGS: There is an acute proximal right humerus surgical neck fracture. Fracture extends through the greater tuberosity with slight displacement as well. Bones are osteopenic. No subluxation or dislocation. AC joint aligned without separation. Included chest unremarkable. IMPRESSION: Acute proximal right humerus surgical neck also involving the greater tuberosity. Electronically Signed   By: Judie Petit.  Shick M.D.   On: 03/18/2023 16:22     Procedures Procedures (including critical care time)  Medications Ordered in UC Medications  ketorolac (TORADOL) injection 30 mg (30 mg Intramuscular Given 03/18/23 1557)    Initial Impression / Assessment and Plan / UC Course  I have reviewed the triage vital signs and the nursing notes.  Pertinent labs & imaging results that were available during my care of the patient were reviewed by me and considered in my medical decision making (see chart for details).      Pt is a 57 y.o.  female presents for acute fall and right upper extremity pain.  On exam, pt has tenderness at anterolateral shoulder tenderness and arm held in elbow flexion. Ordered right shoulder and elbow plain films but pt had significant pain while trying to obtain right elbow films therefore those were truncated due to  acute right shoulder fracture with what appears to be greater tuberosity fracture.   Films personally interpreted by me. Radiologist report reviewed and additionally notes acute proximal right humerus surgical neck also involving the greater tuberosity. Given Toradol IM 30 mg and placed in shoulder sling with swathe.  Pt to go to [facility] for fracture management today.  Prescribed Norco for pain relief. Understanding voiced.   Discussed MDM, treatment plan and plan for follow-up with patient who agrees with plan.   Final Clinical Impressions(s) / UC Diagnoses   Final diagnoses:  Closed fracture of proximal end of right humerus, unspecified fracture morphology, initial encounter     Discharge Instructions      You have a fracture of your right shoulder bone (humerus). Go to [facility] for your proximal right humerus surgical neck fracture also involving the greater tuberosity.      ED Prescriptions     Medication Sig Dispense Auth. Provider   HYDROcodone-acetaminophen (NORCO/VICODIN) 5-325 MG tablet Take 1-2 tablets by mouth every 6 (six) hours as needed for up to 3 days. 15 tablet Evoleht Hovatter, DO      I have reviewed the PDMP during this encounter.   Katha Cabal, DO 03/18/23 1653

## 2023-03-18 NOTE — ED Triage Notes (Signed)
Pt c/o fall at 3pm.  Pt was walking her dog and her dog bolted and pulled her. Pt landed on her right arm. Pt states that her entire arm hurts and her fingers feel numb.  Pt states that she has a balance issue

## 2023-03-20 MED FILL — COSENTYX PEN 300 MG/2 PENS (150 MG/ML) SUBCUTANEOUS: SUBCUTANEOUS | 28 days supply | Qty: 2 | Fill #3

## 2023-03-22 ENCOUNTER — Ambulatory Visit: Admit: 2023-03-22 | Payer: PRIVATE HEALTH INSURANCE

## 2023-03-24 ENCOUNTER — Ambulatory Visit
Admit: 2023-03-24 | Discharge: 2023-03-25 | Disposition: A | Discharge status: Home / Self Care | Payer: PRIVATE HEALTH INSURANCE | Attending: Emergency Medicine

## 2023-03-24 ENCOUNTER — Emergency Department
Admit: 2023-03-24 | Discharge: 2023-03-25 | Disposition: A | Discharge status: Home / Self Care | Payer: PRIVATE HEALTH INSURANCE | Attending: Emergency Medicine

## 2023-03-24 DIAGNOSIS — S42201A Unspecified fracture of upper end of right humerus, initial encounter for closed fracture: Principal | ICD-10-CM

## 2023-03-24 MED ORDER — OXYCODONE 5 MG TABLET
ORAL_TABLET | Freq: Three times a day (TID) | ORAL | 0 refills | 4 days | Status: CP | PRN
Start: 2023-03-24 — End: 2023-03-29

## 2023-03-25 MED ADMIN — oxyCODONE (ROXICODONE) immediate release tablet 5 mg: 5 mg | ORAL | @ 03:00:00 | Stop: 2023-03-24

## 2023-03-25 NOTE — Unmapped (Signed)
Pt has a shoulder immobilizer in place on arrival for right arm injury from a fall 4/17, saw orthopedic and has her next appt 4/29, states today lost her balance and fell again onto that same injured arm, states her doctor is concerned due to balance issues she has a brain injury. Denies hitting her head today.

## 2023-03-25 NOTE — Unmapped (Signed)
[facility]  Emergency Department Provider Note     ED Clinical Impression     Final diagnoses:   Closed fracture of proximal end of right humerus, unspecified fracture morphology, initial encounter (Primary)      Impression, Medical Decision Making, ED Course     Impression: 57 y.o. female with PMH most significant for HTN, HLD, CAD, T2DM, MASH cirrhosis c/b encephalopathy, portal hypertension, grade 2 esophageal varices, ascites who presents with worsening right shoulder pain in the setting of a known right proximal humerus fracture as described below.    On exam, right shoulder in immobilizer. No deformity.  She is neurovascularly intact in the affected extremity with no other complaints at this time.    Patient has had another fall since the time of her initial injury however her x-rays today is overall reassuring showing a fracture of the surgical neck of the right proximal humerus without significant displacement or dislocation.  She did not hit her head or lose consciousness, she is ambulatory without other complaints or need for further imaging in regards to her fall.    Patient has follow-up with an outpatient orthopedist however is requesting referral to [facility] which I have placed.    Will refill short supply of her pain medication.  Patient has not been taking any supplemental pain medications and I have counseled her appropriately on using oxycodone for breakthrough pain only.    Return precautions reviewed and patient is stable for discharge.       ____________________________________________    The case was discussed with the attending physician, who is in agreement with the above assessment and plan.      History     Chief Complaint  Chief Complaint   Patient presents with    Fall       HPI   Ashley Briggs is a 57 y.o. female with past medical history as below who presents with worsening right shoulder pain after losing her balance and falling twice today onto her known right proximal humerus fracture. Denies head strike or LOC. The patient reports she was prescribed a brief course of norco at [facility] when the fracture was first sustained. She has taken the norco with improvement to her pain. She has since run out of her prescription pain medication and is requesting a refill. She has not taken any OTC pain medications. She has an immobilizer in place. She is not on blood thinners.     Outside Historian(s): I have obtained additional history/collateral from cousin at bedside.    External Records Reviewed: I have reviewed recent and relevant previous record, including: Outpatient labs & studies - 03/18/23 right shoulder x-ray demonstrating an acute proximal right humerus surgical neck also involving the greater tuberosity.     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 2 diabetes mellitus (CMS-HCC) 12/02/2022    Dry eye syndrome, bilateral 12/21/2020    Dysphagia 02/02/2023  Endometrial cancer (CMS-HCC) 2007    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    Liver disease     Major depression     Morbid obesity (CMS-HCC) 11/10/2019    Myopia of both eyes with astigmatism and presbyopia 12/02/2022    Nausea 09/22/2022    Non-alcoholic fatty liver disease     NPDR (nonproliferative diabetic retinopathy) (CMS-HCC)     Right eye    Other ascites 09/22/2022    Other chronic pain 11/06/2022    Other insomnia 11/06/2022    PFD (pelvic floor dysfunction) 04/07/2013    Portal hypertension (CMS-HCC) 10/08/2022    Psoriasis     Ptosis of eyelid, left     Left upper eyelid    Reflux     Retinopathy of left eye, background, proliferative 01/09/2021    Right upper quadrant abdominal pain 09/22/2022    Secondary esophageal varices without bleeding (CMS-HCC) 06/17/2022    Type 2 diabetes mellitus, with long-term current use of insulin (CMS-HCC) 12/12/2015    Last Assessment & Plan:   The current medical regimen is effective;  continue present plan and medications.  Patient will continue good management of diabetes especially with weight loss. Hopefully with further weight loss will run into the problem of being overmedicated and having to cut back on medications     Some confusion on diabetes medications was have not.been refilled for over a year.  Pa       Past Surgical History:   Procedure Laterality Date    CHOLECYSTECTOMY      HYSTERECTOMY      endometrial cancer    HYSTERECTOMY      OOPHORECTOMY      PR ANAL PRESSURE RECORD Left 03/31/2013    Procedure: ANORECTAL MANOMETRY;  Surgeon: None None;  Location: [facility];  Service: Gastroenterology    PR BREATH HYDROGEN TEST N/A 03/31/2013    Procedure: BREATH HYDROGEN TEST;  Surgeon: None None;  [facility]: [facility];  Service: Gastroenterology    PR COLSC FLX W/RMVL OF TUMOR POLYP LESION SNARE TQ N/A 09/06/2020    Procedure: COLONOSCOPY FLEX; W/REMOV TUMOR/LES BY SNARE; Surgeon: Chriss Driver, MD;  Location: [facility];  Service: Gastroenterology    PR UPPER GI ENDOSCOPY,BIOPSY N/A 04/15/2013    Procedure: UGI ENDOSCOPY; WITH BIOPSY, SINGLE OR MULTIPLE;  Surgeon: Vickii Chafe, MD;  Location: [facility];  Service: Gastroenterology    PR UPPER GI ENDOSCOPY,BIOPSY N/A 09/06/2020    Procedure: UGI ENDOSCOPY; WITH BIOPSY, SINGLE OR MULTIPLE;  Surgeon: Chriss Driver, MD;  [facility]: [facility];  Service: Gastroenterology    PR UPPER GI ENDOSCOPY,BIOPSY N/A 05/05/2022    Procedure: UGI ENDOSCOPY; WITH BIOPSY, SINGLE OR MULTIPLE;  Surgeon: Chriss Driver, MD;  Location: [facility];  Service: Gastroenterology    PR UPPER GI ENDOSCOPY,BIOPSY N/A 01/22/2023    Procedure: UGI ENDOSCOPY; WITH BIOPSY, SINGLE OR MULTIPLE;  Surgeon: Vonda Antigua, MD;  Location: [facility];  Service: Gastroenterology       Allergies  Patient has no known allergies.    Family History  Family History   Problem Relation Age of Onset    Diabetes Mother     Cancer Mother     No Known Problems Father     No Known Problems Sister     Diabetes Maternal Grandmother     Hypertension Maternal Grandmother     Cancer Maternal Grandfather     Diabetes Maternal Grandfather     No Known Problems Paternal Grandmother     Skin cancer Paternal Grandfather     Stroke Paternal Grandfather     No Known Problems Brother     No Known Problems Maternal Aunt     No Known Problems Maternal Uncle     No Known Problems Paternal Aunt     No Known Problems Paternal Uncle     No Known Problems Other     Melanoma Neg Hx     Basal cell carcinoma Neg Hx     Squamous cell carcinoma Neg Hx     Substance Abuse Disorder Neg Hx     Alcohol abuse Neg Hx     Drug abuse Neg Hx     Mental illness Neg Hx     Amblyopia Neg Hx     Blindness Neg Hx     Cataracts Neg Hx     Glaucoma Neg Hx     Macular degeneration Neg Hx     Retinal detachment Neg Hx Strabismus Neg Hx     Thyroid disease Neg Hx     Breast cancer Neg Hx        Social History  Social History     Tobacco Use    Smoking status: Never     Passive exposure: Current    Smokeless tobacco: Never   Vaping Use    Vaping status: Never Used   Substance Use Topics    Alcohol use: Not Currently     Comment: No longer    Drug use: Not Currently        Physical Exam     VITAL SIGNS:      Vitals:    03/24/23 1756   BP: 108/55   Pulse: 109   Resp: 16   Temp: 36.7 ??C (98.1 ??F)   TempSrc: Oral   SpO2: 100%   Weight: 91.6 kg (202 lb)     Constitutional: Alert and oriented. No acute distress.  Eyes: Conjunctivae are normal.  HEENT: Normocephalic and atraumatic. Conjunctivae clear. No congestion. Moist mucous membranes.   Cardiovascular: Rate as above, regular rhythm. Normal and symmetric distal pulses. Brisk capillary refill. Normal skin turgor.  Respiratory: Normal respiratory effort.   Gastrointestinal: Soft, non-distended, non-tender.  Musculoskeletal: Pain with attempted range of motion and to palpation of the right shoulder without deformity noted, normal range of motion of the elbow and wrist with normal motor function and sensation in the axillary, radial and ulnar nerve distributions. Normal median nerve motor function   Neurologic: Normal speech and language. No gross focal neurologic deficits are appreciated. Patient is moving all extremities equally, face is symmetric at rest and with speech.  Skin: Skin is warm, dry and intact. No rash noted.  Psychiatric: Mood and affect are  normal. Speech and behavior are normal.     Radiology     XR Humerus Right AP Lateral   Final Result   Acute proximal right humeral fracture, described above.          Pertinent labs & imaging results that were available during my care of the patient were independently interpreted by me and considered in my medical decision making (see chart for details).    Documentation assistance was provided by Gus Height, Scribe on March 24, 2023 at 9:25 PM for Ellen Henri, MD.       March 26, 2023 11:08 AM. Documentation assistance provided by the scribe. I was present during the time the encounter was recorded. The information recorded by the scribe was done at my direction and has been reviewed and validated by me.       Portions of this record have been created using Scientist, clinical (histocompatibility and immunogenetics). Dictation errors have been sought, but may not have been identified and corrected.         Ellen Henri, MD  03/26/23 [phone]

## 2023-03-25 NOTE — Unmapped (Signed)
Discharge instructions explained to patient. Pt verbalized understanding. All vital signs within normal limits. Wheeled to discharge in wheelchair.

## 2023-03-30 ENCOUNTER — Emergency Department: Payer: Medicaid Other

## 2023-03-30 DIAGNOSIS — R296 Repeated falls: Secondary | ICD-10-CM | POA: Diagnosis present

## 2023-03-30 DIAGNOSIS — R1011 Right upper quadrant pain: Secondary | ICD-10-CM | POA: Diagnosis not present

## 2023-03-30 DIAGNOSIS — R42 Dizziness and giddiness: Secondary | ICD-10-CM | POA: Insufficient documentation

## 2023-03-30 DIAGNOSIS — R519 Headache, unspecified: Secondary | ICD-10-CM | POA: Diagnosis not present

## 2023-03-30 LAB — CBC WITH DIFFERENTIAL/PLATELET
Abs Immature Granulocytes: 0.02 K/uL (ref 0.00–0.07)
Basophils Absolute: 0 K/uL (ref 0.0–0.1)
Basophils Relative: 1 %
Eosinophils Absolute: 0.2 K/uL (ref 0.0–0.5)
Eosinophils Relative: 5 %
HCT: 32.1 % — ABNORMAL LOW (ref 36.0–46.0)
Hemoglobin: 9.6 g/dL — ABNORMAL LOW (ref 12.0–15.0)
Immature Granulocytes: 1 %
Lymphocytes Relative: 30 %
Lymphs Abs: 1.2 K/uL (ref 0.7–4.0)
MCH: 22.5 pg — ABNORMAL LOW (ref 26.0–34.0)
MCHC: 29.9 g/dL — ABNORMAL LOW (ref 30.0–36.0)
MCV: 75.2 fL — ABNORMAL LOW (ref 80.0–100.0)
Monocytes Absolute: 0.5 K/uL (ref 0.1–1.0)
Monocytes Relative: 13 %
Neutro Abs: 2 K/uL (ref 1.7–7.7)
Neutrophils Relative %: 50 %
Platelets: 179 K/uL (ref 150–400)
RBC: 4.27 MIL/uL (ref 3.87–5.11)
RDW: 17.7 % — ABNORMAL HIGH (ref 11.5–15.5)
WBC: 3.8 K/uL — ABNORMAL LOW (ref 4.0–10.5)
nRBC: 0 % (ref 0.0–0.2)

## 2023-03-30 LAB — COMPREHENSIVE METABOLIC PANEL WITH GFR
ALT: 37 U/L (ref 0–44)
AST: 91 U/L — ABNORMAL HIGH (ref 15–41)
Albumin: 2.5 g/dL — ABNORMAL LOW (ref 3.5–5.0)
Alkaline Phosphatase: 303 U/L — ABNORMAL HIGH (ref 38–126)
Anion gap: 9 (ref 5–15)
BUN: 18 mg/dL (ref 6–20)
CO2: 24 mmol/L (ref 22–32)
Calcium: 9.3 mg/dL (ref 8.9–10.3)
Chloride: 98 mmol/L (ref 98–111)
Creatinine, Ser: 0.81 mg/dL (ref 0.44–1.00)
GFR, Estimated: >60 mL/min
Glucose, Bld: 287 mg/dL — ABNORMAL HIGH (ref 70–99)
Potassium: 3.9 mmol/L (ref 3.5–5.1)
Sodium: 131 mmol/L — ABNORMAL LOW (ref 135–145)
Total Bilirubin: 5.3 mg/dL — ABNORMAL HIGH (ref 0.3–1.2)
Total Protein: 7.4 g/dL (ref 6.5–8.1)

## 2023-03-30 LAB — TROPONIN I (HIGH SENSITIVITY): Troponin I (High Sensitivity): 21 ng/L — ABNORMAL HIGH (ref <18)

## 2023-03-30 NOTE — ED Triage Notes (Signed)
Pt states PCP feels there is something wrong with nerve endings and has scheduled her for MRA.  Had fall today as well, states she fell onto couch and caught herself. Denies hitting head today.

## 2023-03-30 NOTE — ED Triage Notes (Signed)
EMS brings pt in from home for c/o dizziness x 1-2wks post fall; was seen for such on incident

## 2023-03-31 ENCOUNTER — Emergency Department: Payer: Medicaid Other

## 2023-03-31 ENCOUNTER — Emergency Department
Admission: EM | Admit: 2023-03-31 | Discharge: 2023-03-31 | Disposition: A | Discharge status: Home / Self Care | Payer: Medicaid Other | Attending: Emergency Medicine | Admitting: Emergency Medicine

## 2023-03-31 DIAGNOSIS — R296 Repeated falls: Secondary | ICD-10-CM

## 2023-03-31 LAB — AMMONIA: Ammonia: 19 umol/L (ref 9–35)

## 2023-03-31 LAB — TROPONIN I (HIGH SENSITIVITY): Troponin I (High Sensitivity): 22 ng/L — ABNORMAL HIGH (ref <18)

## 2023-03-31 MED ORDER — IOHEXOL 350 MG/ML SOLN
75.0000 mL | Freq: Once | INTRAVENOUS | Status: AC | PRN
  Administered 2023-03-31: 75 mL via INTRAVENOUS

## 2023-03-31 NOTE — Discharge Instructions (Addendum)
Please reach out to your PCP as well as the physical therapist, as we discussed.  Return to the ED with any worsening symptoms.  Continue all of your typical medications.

## 2023-03-31 NOTE — ED Notes (Signed)
Patient stood and ambulated in room well. No report of dizziness.

## 2023-03-31 NOTE — ED Provider Notes (Signed)
[facility] Provider Note    Event Date/Time   First MD Initiated Contact with Patient 03/31/23 [phone]     (approximate)   History   Dizziness   HPI  Abigail Molina is a 57 y.o. female who presents to the ED for evaluation of Dizziness   I review a PCP visit from 3/29.  Evaluated for daily headaches, recurrent falls, unsteady and hepatic encephalopathy.  Physician concerns for polypharmacy..  Patient does have cirrhosis.  MRI/MRI of her brain has been ordered  Patient presents to the ED for evaluation of multiple falls.  She reports balance issues at baseline, on 4/17 she was walking her dog, the dog got spooked and pulled her, causing her to impact her right arm to the ground.  She was diagnosed with a proximal humerus fracture and placed in a sling.  Since that time, she reports 5 or 6 falls.  She reports that sometimes her legs just give out.  She reports she does not think that she has had syncope, but does report an episode where she "just woke up on the ground."  Denies any chest discomfort, shortness of breath, fevers or recent illnesses.  No leg swelling.  Denies any significant trauma from any of these falls and reports that she is able to catch herself when her legs give out.  Denies any pain.  She reports her PCP referred her to a physical therapist who she is supposed to speak with today to set up either home health or an office physical therapy.  Physical Exam   Triage Vital Signs: ED Triage Vitals [03/30/23 2208]  Enc Vitals Group     BP 118/81     Pulse Rate 90     Resp 18     Temp 98.2 F (36.8 C)     Temp Source Oral     SpO2 98 %     Weight 202 lb (91.6 kg)     Height 5\' 4"  (1.626 m)     Head Circumference      Peak Flow      Pain Score 6     Pain Loc      Pain Edu?      Excl. in GC?     Most recent vital signs: Vitals:   03/31/23 0425 03/31/23 0625  BP: (!) 106/54 (!) 99/59  Pulse: 85 88  Resp: 18 18  Temp:  97.9 F (36.6  C)  SpO2: 100% 100%    General: Awake, no distress.  Well-appearing and conversational.  Right arm in a sling and distally neurovascularly intact.  Some old bruising of the right proximal humerus is noted CV:  Good peripheral perfusion.  Resp:  Normal effort.  Abd:  No distention.  MSK:  No deformity noted.  Neuro:  No focal deficits appreciated. Other:     ED Results / Procedures / Treatments   Labs (all labs ordered are listed, but only abnormal results are displayed) Labs Reviewed  CBC WITH DIFFERENTIAL/PLATELET - Abnormal; Notable for the following components:      Result Value   WBC 3.8 (*)    Hemoglobin 9.6 (*)    HCT 32.1 (*)    MCV 75.2 (*)    MCH 22.5 (*)    MCHC 29.9 (*)    RDW 17.7 (*)    All other components within normal limits  COMPREHENSIVE METABOLIC PANEL - Abnormal; Notable for the following components:   Sodium 131 (*)  Glucose, Bld 287 (*)    Albumin 2.5 (*)    AST 91 (*)    Alkaline Phosphatase 303 (*)    Total Bilirubin 5.3 (*)    All other components within normal limits  TROPONIN I (HIGH SENSITIVITY) - Abnormal; Notable for the following components:   Troponin I (High Sensitivity) 21 (*)    All other components within normal limits  TROPONIN I (HIGH SENSITIVITY) - Abnormal; Notable for the following components:   Troponin I (High Sensitivity) 22 (*)    All other components within normal limits  AMMONIA    EKG Sinus rhythm with a rate of 88 bpm.  Couple PACs.  Normal axis.  QTc prolonged at 508.  Nonspecific ST changes laterally and inferiorly without STEMI.  RADIOLOGY CTA chest without evidence of PE, interpreted by me CT head interpreted by me without evidence of acute intracranial pathology  Official radiology report(s): CT Angio Chest PE W and/or Wo Contrast  Result Date: 03/31/2023 CLINICAL DATA:  57 year old female with history of falls and syncope. EXAM: CT ANGIOGRAPHY CHEST WITH CONTRAST TECHNIQUE: Multidetector CT imaging of the  chest was performed using the standard protocol during bolus administration of intravenous contrast. Multiplanar CT image reconstructions and MIPs were obtained to evaluate the vascular anatomy. RADIATION DOSE REDUCTION: This exam was performed according to the departmental dose-optimization program which includes automated exposure control, adjustment of the mA and/or kV according to patient size and/or use of iterative reconstruction technique. CONTRAST:  75mL OMNIPAQUE IOHEXOL 350 MG/ML SOLN COMPARISON:  None Available. FINDINGS: Cardiovascular: No filling defects within the pulmonary arterial tree to suggest pulmonary embolism. Heart size is mildly enlarged. There is no significant pericardial fluid, thickening or pericardial calcification. There is aortic atherosclerosis, as well as atherosclerosis of the great vessels of the mediastinum and the coronary arteries, including calcified atherosclerotic plaque in the left main, left anterior descending, left circumflex and right coronary arteries. Mediastinum/Nodes: No pathologically enlarged mediastinal or hilar lymph nodes. Esophagus is unremarkable in appearance. No axillary lymphadenopathy. Lungs/Pleura: No acute consolidative airspace disease. No pleural effusions. No suspicious appearing pulmonary nodules or masses are noted. Upper Abdomen: Liver has a shrunken appearance and nodular contour, indicative of advanced cirrhosis. Status post cholecystectomy. Spleen is incompletely imaged, but appears enlarged measuring at least 15.9 x 8.3 cm on axial images. Trace volume of ascites adjacent to liver and spleen. Musculoskeletal: Old highly comminuted fracture of the right humeral neck, as demonstrated on recent radiographs 03/18/2023. There are no aggressive appearing lytic or blastic lesions noted in the visualized portions of the skeleton. Review of the MIP images confirms the above findings. IMPRESSION: 1. No evidence of pulmonary embolism. 2. No acute findings  in the thorax to account for the patient's symptoms. 3. Aortic atherosclerosis, in addition to left main and three-vessel coronary artery disease. Please note that although the presence of coronary artery calcium documents the presence of coronary artery disease, the severity of this disease and any potential stenosis cannot be assessed on this non-gated CT examination. Assessment for potential risk factor modification, dietary therapy or pharmacologic therapy may be warranted, if clinically indicated. 4. Mild cardiomegaly. 5. Advanced cirrhosis. Presence of splenomegaly and ascites suggests portal venous hypertension. 6. Highly comminuted right humeral neck fracture partially imaged. See dedicated right shoulder radiograph 03/18/2023. Aortic Atherosclerosis (ICD10-I70.0). Electronically Signed   By: Trudie Reed M.D.   On: 03/31/2023 05:46   CT HEAD WO CONTRAST ( )  Result Date: 03/31/2023 CLINICAL DATA:  Dizziness for 2 weeks  EXAM: CT HEAD WITHOUT CONTRAST TECHNIQUE: Contiguous axial images were obtained from the base of the skull through the vertex without intravenous contrast. RADIATION DOSE REDUCTION: This exam was performed according to the departmental dose-optimization program which includes automated exposure control, adjustment of the mA and/or kV according to patient size and/or use of iterative reconstruction technique. COMPARISON:  06/10/2013 FINDINGS: Brain: No evidence of acute infarction, hemorrhage, hydrocephalus, extra-axial collection or mass lesion/mass effect. Vascular: No hyperdense vessel or unexpected calcification. Skull: Normal. Negative for fracture or focal lesion. Sinuses/Orbits: No acute finding. Other: None. IMPRESSION: No acute intracranial abnormality noted. Electronically Signed   By: Alcide Clever M.D.   On: 03/31/2023 03:55   US ABDOMEN LIMITED RUQ (LIVER/GB)  Addendum Date: 03/31/2023   ADDENDUM REPORT: 03/31/2023 00:45 ADDENDUM: In addition to the above, there is  also mild to moderate perihepatic ascites. Electronically Signed   By: Almira Bar M.D.   On: 03/31/2023 00:45   Result Date: 03/31/2023 CLINICAL DATA:  History of prior cholecystectomy presenting with transaminitis. EXAM: ULTRASOUND ABDOMEN LIMITED RIGHT UPPER QUADRANT COMPARISON:  August 20, 2018 FINDINGS: Gallbladder: The gallbladder is surgically absent. Common bile duct: Diameter: 6.4 mm Liver: No focal lesion identified. The liver borders are nodular in appearance. Within normal limits in parenchymal echogenicity. Portal vein is patent on color Doppler imaging with normal direction of blood flow towards the liver. Other: None. IMPRESSION: 1. Findings consistent with history of prior cholecystectomy. 2. Findings suggestive of hepatic steatosis without focal liver lesions. Electronically Signed: By: Aram Candela M.D. On: 03/30/2023 23:57    PROCEDURES and INTERVENTIONS:  .1-3 Lead EKG Interpretation  Performed by: Delton Prairie, MD Authorized by: Delton Prairie, MD     Interpretation: normal     ECG rate:  86   ECG rate assessment: normal     Rhythm: sinus rhythm     Ectopy: none     Conduction: normal     Medications  iohexol (OMNIPAQUE) 350 MG/ML injection 75 mL (75 mLs Intravenous Contrast Given 03/31/23 0456)     IMPRESSION / MDM / ASSESSMENT AND PLAN / ED COURSE  I reviewed the triage vital signs and the nursing notes.  Differential diagnosis includes, but is not limited to, vertigo, PE, deconditioning, hyperammonemia, blood loss anemia  {Patient presents with symptoms of an acute illness or injury that is potentially life-threatening.  57 year old woman with cirrhosis presents to the ED with multiple falls in the past couple weeks, without clear signs of acute pathology and suitable for trial of outpatient management.  Look systemically well to me, is asymptomatic and has a reassuring examination without evidence of neurologic or vascular deficits.  No clear signs of  acute trauma, she does have her right arm in a sling from the known and old humerus fracture.  Blood work with 2 flat troponins, chronic microcytic anemia, normal ammonia level.  Elevated alk phos and bilirubin is noted and RUQ ultrasound without dilated CBD, likely related to her known cirrhosis.  No abdominal pain to suggest choledocholithiasis.  CT head is reassuring and CTA chest without evidence of PE.  She is ambulatory with nursing staff and reports feeling okay.  Considering she is well-connected with a PCP and has physical therapy already being arranged, I think this is the most important thing for her.  We discussed the possibility of observation admission but ultimately we decided to trial outpatient management with close return precautions.  Clinical Course as of 03/31/23 0738  Tue Mar 31, 2023  47 Reassessed and discussed plan of care [DS]  [phone] Reassessed.  Feeling well.  We discussed reassuring workup.  Discussed plan of care and patient is comfortable going home.  Discussed return precautions.  She confirms that she was planning to call the physical therapist that she was just referred to later today. [DS]    Clinical Course User Index [DS] Delton Prairie, MD     FINAL CLINICAL IMPRESSION(S) / ED DIAGNOSES   Final diagnoses:  Multiple falls     Rx / DC Orders   ED Discharge Orders     None        Note:  This document was prepared using Dragon voice recognition software and may include unintentional dictation errors.   Delton Prairie, MD 03/31/23 [phone]

## 2023-04-01 ENCOUNTER — Ambulatory Visit: Admit: 2023-04-01 | Discharge: 2023-04-02 | Payer: PRIVATE HEALTH INSURANCE | Attending: Family | Primary: Family

## 2023-04-01 ENCOUNTER — Ambulatory Visit: Admit: 2023-04-01 | Discharge: 2023-04-02 | Payer: PRIVATE HEALTH INSURANCE

## 2023-04-01 MED ORDER — HYDROCODONE 5 MG-ACETAMINOPHEN 325 MG TABLET
ORAL_TABLET | Freq: Three times a day (TID) | ORAL | 0 refills | 7 days | Status: CP | PRN
Start: 2023-04-01 — End: ?

## 2023-04-01 NOTE — Unmapped (Addendum)
Plan: No lifting with the right hand or arm.  Use the sling for comfort but come out of the sling for gentle range of motion of the elbow wrist and shoulder as we discussed in clinic.  Norco for severe pain with Tylenol for more mild pain.  Please contact me with any questions or concerns.      Thank you for coming to [facility] and our clinic today!     We aim to provide you with the highest quality, individualized care.  If you have any unanswered questions after the visit, please do not hesitate to reach out to [place] on MyChart or leave a message for the nurse.  ?  MyChart messages: These messages can be sent to your provider and will be checked by their clinical support staff.? The messages are checked throughout the day during normal business hours from 8:30 am-4:00 pm Monday-Friday, however responses may take up to 48 hours.? Please use this method of communication for non-urgent and non-emergent concerns, questions, refill requests or inquiries only.? ?Our team will help respond to all of your questions.? Please note that you may be asked to see a provider by either a telehealth or in person visit if it is deemed your questions are best handled in the clinic setting in person.??  ?  Please keep in mind, these messages are not real time communications, so be patient when waiting for a response.    If you do not have access to MyChart, do not know how to use MyChart or have an issue that may require more extensive discussion, please call the nurses' call line: [phone].? This line is checked throughout the day and will be responded to as time allows.? Please note that return calls could take up to 48 hours, depending on the nature of the need.?  ?  If you have an issue that requires emergent attention that cannot wait; either call the Orthopaedics resident on call at [phone], consider coming to our [facility] walk-in clinic, or go to the nearest Emergency Department.    If you need to schedule future appointments, please call [phone].     We look forward to seeing you again in the future and appreciate you choosing [place] for your care!    Thank you,                We provide innovative and comprehensive patient centered care that is supported by evidence-based research                                                                                                    RESEARCH PARTICIPATION    Please check out our current research studies to see if you or someone you know may qualify at:    [email or web address]

## 2023-04-01 NOTE — Unmapped (Signed)
SPORTS MEDICINE RETURN VISIT    ASSESSMENT AND PLAN      Diagnosis ICD-10-CM Associated Orders   1. Closed fracture of proximal end of right humerus, unspecified fracture morphology, initial encounter  S42.201A Ambulatory referral to Orthopedic Surgery           Nonoperative treatment recommended for this fracture. Recommended NWB with RUE, using sling as needed for comfort but coming out of sling often for gentle elbow ROM and pendulum ROM demonstrated for patient in clinic today. New rx for norco provided for severe pain.   New shoulder immobilizer provided.     No follow-ups on file.    Procedure(s):  none      SUBJECTIVE     Chief Complaint:   Chief Complaint   Patient presents with    Right Arm - Injury     Dog was startled and caused her to fall  In immobilizer now  Went to [facility]  Tylenol for pain/out of Norco  Humerus fracture in 2 places  Saw orthopaedic in [place]  Numerous falls  Pain is worsening  Painful just to touch with swelling and bruising  No history of shoulder injury/issue in the past       History of Present Illness: 57 y.o. female who presents for R proximal humerus fracture sustained in a fall when walking her dog on 03/18/23; seen at outside ortho who she states recommended surgery. She had a fall onto the floor in her home yesterday and was transported to the [facility] ED via EMS. Pain has gotten worse since the injury.      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 2 diabetes mellitus (CMS-HCC) 12/02/2022    Dry eye syndrome, bilateral 12/21/2020    Dysphagia 02/02/2023    Endometrial cancer (CMS-HCC) 2007    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    Liver disease     Major depression     Morbid obesity (CMS-HCC) 11/10/2019    Myopia of both eyes with astigmatism and presbyopia 12/02/2022    Nausea 09/22/2022    Non-alcoholic fatty liver disease     NPDR (nonproliferative diabetic retinopathy) (CMS-HCC)     Right eye    Other ascites 09/22/2022  Other chronic pain 11/06/2022    Other insomnia 11/06/2022    PFD (pelvic floor dysfunction) 04/07/2013    Portal hypertension (CMS-HCC) 10/08/2022    Psoriasis     Ptosis of eyelid, left     Left upper eyelid    Reflux     Retinopathy of left eye, background, proliferative 01/09/2021    Right upper quadrant abdominal pain 09/22/2022    Secondary esophageal varices without bleeding (CMS-HCC) 06/17/2022    Type 2 diabetes mellitus, with long-term current use of insulin (CMS-HCC) 12/12/2015    Last Assessment & Plan:   The current medical regimen is effective;  continue present plan and medications.  Patient will continue good management of diabetes especially with weight loss. Hopefully with further weight loss will run into the problem of being overmedicated and having to cut back on medications     Some confusion on diabetes medications was have not.been refilled for over a year.  Pa         OBJECTIVE     Physical Exam:  Vitals:   Wt Readings from Last 3 Encounters:   03/24/23 91.6 kg (202 lb)   03/06/23 94 kg (207 lb 4.8 oz)   02/27/23 93.5 kg (206 lb 1.6 oz)     Estimated body mass index is 34.91 kg/m?? as calculated from the following:    Height as of 03/06/23: 162 cm (5' 3.78).    Weight as of 03/24/23: 91.6 kg (202 lb).  Gen: Well-appearing female in no acute distress  MSK: R proximal humerus with mild diffuse edema. TTP proximal humerus. +OK, EPL and IO. Cap refill is brisk. Skin is warm, dry and intact with resolving ecchymosis.     Imaging/other tests: R shoulder x-rays taken today and reviewed with patient in exam room show:   Unchanged alignment of the comminuted fracture involving the surgical neck and greater tuberosity of the right humerus. The lucent fracture lines remain visible. No significant interval callus formation. Glenohumeral joint space and alignment are preserved. Mild acromioclavicular osteoarthritis is again noted.    @SPORTSPROMIS @      ADMINISTRATIVE     I have personally reviewed and interpreted the images (as available).  Point-of-care ultrasound imaging is on file and stored in a permanent location (if performed).  I have personally reviewed prior records and incorporated relevant information above (as available).    @SMIBILLING @    PROCEDURES     Procedures     DME     DME ORDER:  Dx:  ,

## 2023-04-03 ENCOUNTER — Other Ambulatory Visit: Payer: Self-pay | Admitting: Psychiatry

## 2023-04-10 ENCOUNTER — Ambulatory Visit
Admit: 2023-04-10 | Payer: PRIVATE HEALTH INSURANCE | Attending: Student in an Organized Health Care Education/Training Program | Primary: Student in an Organized Health Care Education/Training Program

## 2023-04-14 MED ORDER — HYDROCODONE 5 MG-ACETAMINOPHEN 325 MG TABLET
ORAL_TABLET | Freq: Three times a day (TID) | ORAL | 0 refills | 7 days | Status: CP | PRN
Start: 2023-04-14 — End: ?

## 2023-04-15 ENCOUNTER — Ambulatory Visit: Payer: Medicaid Other | Admitting: Internal Medicine

## 2023-04-15 ENCOUNTER — Encounter: Payer: Self-pay | Admitting: Internal Medicine

## 2023-04-15 ENCOUNTER — Ambulatory Visit
Admit: 2023-04-15 | Discharge: 2023-04-19 | Disposition: A | Discharge status: Home / Self Care | Payer: PRIVATE HEALTH INSURANCE

## 2023-04-15 VITALS — BP 124/70 | HR 100 | Temp 98.4°F | Ht 64.0 in | Wt 233.2 lb

## 2023-04-15 DIAGNOSIS — G4719 Other hypersomnia: Secondary | ICD-10-CM

## 2023-04-15 DIAGNOSIS — G4733 Obstructive sleep apnea (adult) (pediatric): Secondary | ICD-10-CM | POA: Diagnosis not present

## 2023-04-15 LAB — COMPREHENSIVE METABOLIC PANEL
ALBUMIN: 2.3 g/dL — ABNORMAL LOW (ref 3.4–5.0)
ALKALINE PHOSPHATASE: 390 U/L — ABNORMAL HIGH (ref 46–116)
ALT (SGPT): 16 U/L (ref 10–49)
ANION GAP: 9 mmol/L (ref 5–14)
AST (SGOT): 52 U/L — ABNORMAL HIGH (ref <=34)
BILIRUBIN TOTAL: 2.4 mg/dL — ABNORMAL HIGH (ref 0.3–1.2)
BLOOD UREA NITROGEN: 8 mg/dL — ABNORMAL LOW (ref 9–23)
BUN / CREAT RATIO: 16
CALCIUM: 9.2 mg/dL (ref 8.7–10.4)
CHLORIDE: 101 mmol/L (ref 98–107)
CO2: 24.1 mmol/L (ref 20.0–31.0)
CREATININE: 0.49 mg/dL — ABNORMAL LOW
EGFR CKD-EPI (2021) FEMALE: >90 mL/min/{1.73_m2} (ref >=60)
GLUCOSE RANDOM: 301 mg/dL — ABNORMAL HIGH (ref 70–179)
POTASSIUM: 3.7 mmol/L (ref 3.4–4.8)
PROTEIN TOTAL: 7.4 g/dL (ref 5.7–8.2)
SODIUM: 134 mmol/L — ABNORMAL LOW (ref 135–145)

## 2023-04-15 LAB — CBC W/ AUTO DIFF
BASOPHILS ABSOLUTE COUNT: 0 10*9/L (ref 0.0–0.1)
BASOPHILS RELATIVE PERCENT: 0.9 %
EOSINOPHILS ABSOLUTE COUNT: 0.3 10*9/L (ref 0.0–0.5)
EOSINOPHILS RELATIVE PERCENT: 10.5 %
HEMATOCRIT: 32.1 % — ABNORMAL LOW (ref 34.0–44.0)
HEMOGLOBIN: 10.1 g/dL — ABNORMAL LOW (ref 11.3–14.9)
LYMPHOCYTES ABSOLUTE COUNT: 0.8 10*9/L — ABNORMAL LOW (ref 1.1–3.6)
LYMPHOCYTES RELATIVE PERCENT: 29.2 %
MEAN CORPUSCULAR HEMOGLOBIN CONC: 31.4 g/dL — ABNORMAL LOW (ref 32.0–36.0)
MEAN CORPUSCULAR HEMOGLOBIN: 22.9 pg — ABNORMAL LOW (ref 25.9–32.4)
MEAN CORPUSCULAR VOLUME: 73 fL — ABNORMAL LOW (ref 77.6–95.7)
MEAN PLATELET VOLUME: 7.3 fL (ref 6.8–10.7)
MONOCYTES ABSOLUTE COUNT: 0.3 10*9/L (ref 0.3–0.8)
MONOCYTES RELATIVE PERCENT: 12.5 %
NEUTROPHILS ABSOLUTE COUNT: 1.3 10*9/L — ABNORMAL LOW (ref 1.8–7.8)
NEUTROPHILS RELATIVE PERCENT: 46.9 %
NUCLEATED RED BLOOD CELLS: 0 /100{WBCs} (ref <=4)
PLATELET COUNT: 220 10*9/L (ref 150–450)
RED BLOOD CELL COUNT: 4.4 10*12/L (ref 3.95–5.13)
RED CELL DISTRIBUTION WIDTH: 20.9 % — ABNORMAL HIGH (ref 12.2–15.2)
WBC ADJUSTED: 2.8 10*9/L — ABNORMAL LOW (ref 3.6–11.2)

## 2023-04-15 LAB — PROTIME-INR
INR: 1.4
PROTIME: 15.4 s — ABNORMAL HIGH (ref 9.9–12.6)

## 2023-04-15 LAB — LIPASE: LIPASE: 36 U/L (ref 12–53)

## 2023-04-15 LAB — HIGH SENSITIVITY TROPONIN I - SINGLE: HIGH SENSITIVITY TROPONIN I: 4 ng/L (ref <=34)

## 2023-04-15 LAB — SLIDE REVIEW

## 2023-04-15 LAB — ETHANOL: ETHANOL: <10 mg/dL (ref <=10)

## 2023-04-15 LAB — B-TYPE NATRIURETIC PEPTIDE: B-TYPE NATRIURETIC PEPTIDE: 48.92 pg/mL (ref <=100)

## 2023-04-15 MED ADMIN — metFORMIN (GLUCOPHAGE) tablet 1,000 mg: 1000 mg | ORAL | @ 22:00:00

## 2023-04-15 MED ADMIN — HYDROcodone-acetaminophen (NORCO) 5-325 mg per tablet 1 tablet: 1 | ORAL | @ 21:00:00 | Stop: 2023-04-29

## 2023-04-15 MED ADMIN — insulin lispro (HumaLOG) injection 10 Units: 10 [IU] | SUBCUTANEOUS | @ 22:00:00

## 2023-04-15 MED ADMIN — aspirin chewable tablet 81 mg: 81 mg | ORAL | @ 21:00:00

## 2023-04-15 MED ADMIN — cholestyramine (QUESTRAN) 4 gram packet 1 packet: 1 | ORAL | @ 22:00:00

## 2023-04-15 MED ADMIN — spironolactone (ALDACTONE) tablet 100 mg: 100 mg | ORAL | @ 21:00:00

## 2023-04-15 MED ADMIN — furosemide (LASIX) injection 60 mg: 60 mg | INTRAVENOUS | @ 14:00:00 | Stop: 2023-04-15

## 2023-04-15 MED ADMIN — lactulose (CEPHULAC) packet 20 g: 20 g | ORAL | @ 21:00:00

## 2023-04-15 NOTE — Unmapped (Signed)
[facility]  Emergency Department Provider Note     ED Clinical Impression     Final diagnoses:   Ascites due to alcoholic cirrhosis (CMS-HCC) (Primary)   Hypervolemia, unspecified hypervolemia type      Impression, Medical Decision Making, ED Course     Impression: 57 y.o. female with PMH most significant for CAD, HTN, hyperlipidemia, T2DM, cirrhosis c/b encephalopathy, and grade 2 esophageal varices who presents with 1 week of worsening abdominal distension and swelling in the bilateral legs and feet with 30lbs of weight gain over the past week as described below.    On exam, patient is nontoxic appearing and in no acute distress. Vital signs are notable for mild tachycardia to 106; otherwise hemodynamically stable, afebrile. Exam is remarkable for 2+ BLE edema to the knee. Abdominal distension with positive fluid wave but no tenderness.     Differential includes volume overload medication noncompliance versus increased intake versus progression of patient's cirrhosis versus CHF versus renal failure.    Plan to obtain US liver, as well as basic labs, lipase, PT-INR, and 4plex. Will treat patient with Lasix.    ED Course as of 04/16/23 1808   Wed Apr 15, 2023   1210 Patient's workup has been largely reassuring and at baseline, chest x-ray is negative for any severe or significant pulmonary edema, no infiltrate.  Given her reported compliance with her diuretics and severe volume overload will admit for further diuresis.  I have spoken to the hospitalist who is assuming care.     ____________________________________________    The case was discussed with the attending physician, who is in agreement with the above assessment and plan.      History     Chief Complaint  Chief Complaint   Patient presents with    Edema       HPI   Ashley Briggs is a 57 y.o. female with past medical history as below who presents with abdominal distension and leg swelling. The patient reports 1 week of worsening abdominal distension and swelling in the bilateral legs and feet with 30lbs of weight gain over the past week. She has adhered to her diuretics without symptomatic relief, and she notes that her urinary output has decreased. She further notes mild centralized chest pressure and tightness, causing her some shortness of breath. She additionally reports odynophagia and dysphagia. She denies nausea, vomiting, abdominal pain, diarrhea, or fevers.     Outside Historian(s): I have obtained additional history/collateral from none.    External Records Reviewed: I have reviewed recent and relevant previous record, including: Outpatient notes - 02/11/2023 Endocrinology note for patient's past medical history.    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 2 diabetes mellitus (CMS-HCC) 12/02/2022    Dry eye syndrome, bilateral 12/21/2020    Dysphagia 02/02/2023    Endometrial cancer (CMS-HCC) 2007    Esophageal varices in cirrhosis (CMS-HCC) 06/17/2022    Gastroparesis  Generalized pruritus 02/02/2023    H/O diabetic gastroparesis 01/09/2021    Heart disease     Hemorrhage of rectum and anus 03/17/2011    Hepatic cirrhosis (CMS-HCC) 06/17/2022    Hepatic encephalopathy (CMS-HCC) 06/17/2022    Hepatic steatosis 03/13/2021    Noted on CT Fall 2021    High cholesterol     Hx of psoriasis 08/06/2022    Hyperlipidemia 12/12/2015    Last Assessment & Plan:   The current medical regimen is effective;  continue present plan and medications.    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    Liver disease     Major depression     Morbid obesity (CMS-HCC) 11/10/2019    Myopia of both eyes with astigmatism and presbyopia 12/02/2022    Nausea 09/22/2022    Non-alcoholic fatty liver disease     NPDR (nonproliferative diabetic retinopathy) (CMS-HCC)     Right eye    Other ascites 09/22/2022    Other chronic pain 11/06/2022    Other insomnia 11/06/2022    PFD (pelvic floor dysfunction) 04/07/2013    Portal hypertension (CMS-HCC) 10/08/2022    Psoriasis     Ptosis of eyelid, left     Left upper eyelid    Reflux     Retinopathy of left eye, background, proliferative 01/09/2021    Right upper quadrant abdominal pain 09/22/2022    Secondary esophageal varices without bleeding (CMS-HCC) 06/17/2022    Type 2 diabetes mellitus, with long-term current use of insulin (CMS-HCC) 12/12/2015    Last Assessment & Plan:   The current medical regimen is effective;  continue present plan and medications.  Patient will continue good management of diabetes especially with weight loss. Hopefully with further weight loss will run into the problem of being overmedicated and having to cut back on medications     Some confusion on diabetes medications was have not.been refilled for over a year.  Pa       Past Surgical History:   Procedure Laterality Date    CHOLECYSTECTOMY      HYSTERECTOMY      endometrial cancer    HYSTERECTOMY      OOPHORECTOMY      PR ANAL PRESSURE RECORD Left 03/31/2013    Procedure: ANORECTAL MANOMETRY;  Surgeon: None None;  [facility];  Service: Gastroenterology    PR BREATH HYDROGEN TEST N/A 03/31/2013    Procedure: BREATH HYDROGEN TEST;  Surgeon: None None;  Location: [facility];  Service: Gastroenterology    PR COLSC FLX W/RMVL OF TUMOR POLYP LESION SNARE TQ N/A 09/06/2020    Procedure: COLONOSCOPY FLEX; W/REMOV TUMOR/LES BY SNARE;  Surgeon: Chriss Driver, MD;  Location: [facility];  Service: Gastroenterology    PR UPPER GI ENDOSCOPY,BIOPSY N/A 04/15/2013    Procedure: UGI ENDOSCOPY; WITH BIOPSY, SINGLE OR MULTIPLE;  Surgeon: Vickii Chafe, MD;  [facility];  Service: Gastroenterology    PR UPPER GI ENDOSCOPY,BIOPSY N/A 09/06/2020    Procedure: UGI ENDOSCOPY; WITH BIOPSY, SINGLE OR MULTIPLE;  Surgeon: Chriss Driver, MD;  Location: [facility];  Service: Gastroenterology    PR UPPER GI ENDOSCOPY,BIOPSY N/A 05/05/2022    Procedure: UGI ENDOSCOPY; WITH BIOPSY, SINGLE OR MULTIPLE;  Surgeon: Chriss Driver, MD;  [facility]: [facility];  Service: Gastroenterology    PR UPPER GI ENDOSCOPY,BIOPSY N/A 01/22/2023    Procedure: UGI ENDOSCOPY; WITH BIOPSY, SINGLE  OR MULTIPLE;  Surgeon: Vonda Antigua, MD;  [facility];  Service: Gastroenterology       Allergies  Patient has no known allergies.    Family History  Family History   Problem Relation Age of Onset    Diabetes Mother     Cancer Mother     No Known Problems Father     No Known Problems Sister     Diabetes Maternal Grandmother     Hypertension Maternal Grandmother     Cancer Maternal Grandfather     Diabetes Maternal Grandfather     No Known Problems Paternal Grandmother     Skin cancer Paternal Grandfather     Stroke Paternal Grandfather     No Known Problems Brother     No Known Problems Maternal Aunt     No Known Problems Maternal Uncle     No Known Problems Paternal Aunt     No Known Problems Paternal Uncle     No Known Problems Other     Melanoma Neg Hx     Basal cell carcinoma Neg Hx     Squamous cell carcinoma Neg Hx     Substance Abuse Disorder Neg Hx     Alcohol abuse Neg Hx     Drug abuse Neg Hx     Mental illness Neg Hx     Amblyopia Neg Hx     Blindness Neg Hx     Cataracts Neg Hx     Glaucoma Neg Hx     Macular degeneration Neg Hx     Retinal detachment Neg Hx     Strabismus Neg Hx     Thyroid disease Neg Hx Breast cancer Neg Hx        Social History  Social History     Tobacco Use    Smoking status: Never     Passive exposure: Current    Smokeless tobacco: Never   Vaping Use    Vaping status: Never Used   Substance Use Topics    Alcohol use: Not Currently     Comment: No longer    Drug use: Not Currently        Physical Exam     VITAL SIGNS:      Vitals:    04/15/23 2325 04/16/23 0600 04/16/23 0934 04/16/23 1600   BP: 119/56  109/55 117/54   Pulse: 99  86 77   Resp: 16   18   Temp: 36.7 ??C (98.1 ??F)   36.8 ??C (98.2 ??F)   TempSrc: Temporal   Oral   SpO2: 98%   97%   Weight:  98.8 kg (217 lb 12.8 oz)       Constitutional: Alert and oriented. No acute distress.  Eyes: Conjunctivae are normal.  HEENT: Normocephalic and atraumatic. Conjunctivae clear. No congestion. Moist mucous membranes.   Cardiovascular: Rate as above, regular rhythm. Normal and symmetric distal pulses. Brisk capillary refill. Normal skin turgor.  Respiratory: Normal respiratory effort. Breath sounds are normal. There are no wheezing or crackles heard.  Gastrointestinal: Distension with positive fluid wave but no tenderness.   Genitourinary: Deferred.  Musculoskeletal: Non-tender with normal range of motion in all extremities. 2+ BLE edema to the knee.   Neurologic: Normal speech and language. No gross focal neurologic deficits are appreciated. Patient is moving all extremities equally, face is symmetric at rest and with speech.  Skin: Skin is warm, dry and intact. No rash noted.  Psychiatric: Mood and  affect are normal. Speech and behavior are normal.     Radiology     US Liver Doppler   Final Result   Patent hepatic vasculature with normal flow direction.      Hepatic cirrhosis with portal hypertension.      Sequelae of cholecystectomy.      XR Chest 1 view Portable   Final Result      No acute abnormalities.          Pertinent labs & imaging results that were available during my care of the patient were independently interpreted by me and considered in my medical decision making (see chart for details).    Portions of this record have been created using Scientist, clinical (histocompatibility and immunogenetics). Dictation errors have been sought, but may not have been identified and corrected.    Documentation assistance was provided by Johnathan Hausen, Scribe, on Apr 15, 2023 at 9:44 AM for Ellen Henri, MD.    Apr 16, 2023 6:08 PM. Documentation assistance provided by the scribe. I was present during the time the encounter was recorded. The information recorded by the scribe was done at my direction and has been reviewed and validated by me.        Ellen Henri, MD  04/16/23 [phone]

## 2023-04-15 NOTE — Unmapped (Signed)
Patient presents here with c/o increased swelling to the feet and abdominal distension since a week - reports gaining 30lb in a week. H/o cirrhosis

## 2023-04-15 NOTE — Unmapped (Signed)
[facility] Medicine   History and Physical       Assessment and Plan     Ashley Briggs is a 57 y.o. female who is presenting to [facility] with Other ascites, in the setting of the following pertinent/contributing co-morbidities: DM, cirrhosis, HTN .    Ascites/Hepatic cirrhosis with history of HE, ascites, varices:  Illness that poses a threat to life or bodily function without appropriate treatment.  Presents primarily with weight gain and evidence of worsening ascites and LE edema. Reports compliance with medications and salt intake but recent increase in fluid intake related to dry mouth.  She did not realize this can worsen her fluid status/ascites.  Suspect this is significant contributor.  No other obvious source, LFTs stable from previous, no pain, fever to suggest other complications. No evidence of GI bleeding, hepatic encephalopathy. Admit for LVP, diuresis and evaluation.  Will perform LVP today and test for SBP.  Increase diuretics, follow IOs, weights to ensure response, education on fluid intake/salt.  Consider discharge with diuretic dosing based on weight changes.  -LVP today with cell count to rule out SBP  -Consider liver doppler after LVP for clot  -Follow IOs, weight  -Increase diuresis furosemide 40 mg bid and spironolactone 100 mg bid  -Monitor kidney function  -Continue lactulose and rifaximin at current dose  -Continue carvedilol    Secondary/Additional Active Problems:  Hypertension:  Normal pressures on arrival.  On carvedilol (also for varices/portal HTN).  -Continue carvedilol at usual dose    Diabetes mellitus (CMS-HCC):  Last HbA1c 8.4% in March.  Now on glargine insulin with short acting with meals as well SGLT2 inhibitor, GLP1 agonist and metformin. Follows with [facility] and has upcoming appointment with her outpatient provider in June. Patient reports continue suboptimal control, likely needs further titration of her insulin as she is on appropriate adjunctive medication.  -Continue glargine insulin with lispro mealtime doses as per usual regimen  -Continue metformin  -Hold SGLT2 and GLP1 agents as non formulary but can restart on discharge  -Unless marked elevations, likely defer changes to outpatient as will not be on usual regimen here    Prophylaxis  -ambulation    Diet  -Low salt, fluid restriction    Code Status / HCDM  -Full Code, Discussed with patient at the time of admission   -  HCDM (patient stated preference): Caudle,Ryan - Relative - [phone]    Significant Comorbid Conditions:     -Fatty Liver POA requiring dietary guidance and education    Issues Impacting Complexity of Management:  -Discussion of risks/benefits of paracentesis  with the patient including risk of risk factor: hemorrhage, infection, and worsening renal function .    Medical Decision Making: Reviewed records from the following unique sources  Hepatology noted. Independently interpreted POCUS of abdomen notable for large fluid pocket in RLQ.    I personally spent greater than 75 minutes face-to-face and non-face-to-face in the care of this patient, which includes all pre, intra, and post visit time on the date of service.  All documented time was specific to the E/M visit and does not include any procedures that may have been performed.    HPI      Ashley Briggs is a 57 y.o. female who is presenting to [facility] with Other ascites.    History obtained from patient.    History of cirrhosis follows with Dr. Eudelia Bunch from [facility].  Known ascites, had one previous LVP in October 2023 with 3.3  L removed prior to starting diuretics. Otherwise has been managed with diuretics and weights have been stable without issues. Does weigh herself daily.    Notes that over the last week her weights have steadily increased and noted increased abdominal girth and LE edema.  LE edema associated with some itching and rash associated with excoriations.  No change in pant size.  Reports good compliance with diuretics but feels they are working less.  Does follow a low salt diet and does not report any change in diet this week.  However, she does report increasing her fluid intake due to dry mouth.  Drinking several glasses of water, Dr. Reino Kent and sucking ice most of the day for symptom relief.  Denies any abdominal pain. Reports remote history of fever earlier in year but none recently.  Reports some discomfort with breathing.      Has DM on glargine, lispro and other medications.  Reports that her sugars remain poorly controlled, mostly in 200s but did note 400 today.  Reports a decreased appetite recently related to dry mouth, change in her taste and nausea.  No dark stools, bleeding noted.  No other new symptoms.  Has suspected sleep apnea but has not yet had a recent sleep study (last done in 2013) but is set up for an upcoming home study.      ER course was notable for furosemide 60 mg IV x 1.        Med Rec Confidence   I reviewed the Medication List. The current list is Accurate    Physical Exam   Temp:  [37 ??C (98.6 ??F)] 37 ??C (98.6 ??F)  Heart Rate:  [106] 106  Resp:  [18] 18  BP: (130)/(69) 130/69  SpO2:  [96 %] 96 %  Body mass index is 40.27 kg/m??.    GEN: NAD, lying in bed, pleasant  EYES: EOMI, pupils equal, round, reactive to light  ENT: OP dry, sides of tongue with white plaques bilterally  CV: JVP <5 cm, RRR, normal S1, S2, no murmurs appreciated  PULM: clear, good air entry  ABD: soft, distended with bulging flanks, normal bowel sounds, no masses, no HSM  EXT: 3+ edema to upper shins, good pulses  NEURO: No focal deficits  PSYCH: A+Ox3, appropriate  GU: No CVA tenderness  MSK: No spinal tenderness  SKIN: excoriations over bilateral lower legs        ___________________________________________________________________    Medications     Prior to Admission medications    Medication Dose, Route, Frequency   acetaminophen (TYLENOL) 325 MG tablet 650 mg, Oral, 2 times a day PRN   aspirin (ECOTRIN) 81 MG tablet 81 mg, Oral, Daily (standard)   blood sugar diagnostic Strp Test two (2) times a day (30 minutes before a meal).   blood-glucose meter kit Use as directed by provider.   canagliflozin (INVOKANA) 300 mg Tab tablet Take 1 tablet (300 mg total) by mouth daily before breakfast. NEED APPOINTMENT FOR FURTHER REFILLS.   carvedilol (COREG) 6.25 MG tablet Take 1 tablet (6.25 mg total) by mouth Two (2) times a day. Start with 1 tablet nightly, if tolerating well increase to twice daily   chlorhexidine (PERIDEX) 0.12 % solution    cholestyramine-aspartame (CHOLESTYRAMINE LIGHT) 4 gram PwPk Mix 1 packet as directed and take by mouth daily.   clobetasol (TEMOVATE) 0.05 % ointment Apply the medication twice daily to stubborn areas of the skin until smooth. Then stop and re-start as the skin changes come  back.   dulaglutide (TRULICITY) 1.5 mg/0.5 mL PnIj Inject the contents of 2 syringes (3 mg total) under the skin every seven (7) days.   ferrous sulfate (SLOW FE) 142 mg (45 mg iron) TbER Take 1 tablet (142 mg) by mouth two (2) times a day.  Patient taking differently: Take 142 mg by mouth in the morning.   furosemide (LASIX) 40 MG tablet 40 mg, Oral, Daily (standard)   HYDROcodone-acetaminophen (NORCO) 5-325 mg per tablet 1 tablet, Oral, Every 8 hours PRN   insulin glargine (LANTUS SOLOSTAR U-100 INSULIN) 100 unit/mL (3 mL) injection pen 66 Units, Subcutaneous, Nightly   insulin lispro (HUMALOG) 100 unit/mL injection pen 10 Units, Subcutaneous, 3 times a day (AC)   lactulose 10 gram/15 mL solution 20 g, Oral, 3 times a day (standard)   lancets (ACCU-CHEK SOFTCLIX LANCETS) Misc Use as directed to test once daily.   metFORMIN (GLUCOPHAGE-XR) 500 MG 24 hr tablet 1,000 mg, Oral, 2 times a day with meals   mirtazapine (REMERON) 7.5 MG tablet 7.5 mg, Oral   pantoprazole (PROTONIX) 40 MG tablet 40 mg, Oral, 2 times a day (standard)   pen needle, diabetic 32 gauge x 1/4 (6 mm) Ndle 1 each, Subcutaneous, Nightly   promethazine (PHENERGAN) 12.5 MG tablet 12.5 mg, Oral, Every 8 hours PRN   rifAXIMin (XIFAXAN) 550 mg Tab 550 mg, Oral, 2 times a day   secukinumab (COSENTYX PEN) 150 mg/mL PnIj injection Inject the contents of 2 pens (150 mg total) under the skin weekly for 5 weeks. Loading dose.   secukinumab (COSENTYX PEN, 2 PENS,) 150 mg/mL PnIj injection Inject the contents of 2 pens (300 mg total) under the skin every twenty-eight (28) days. Maintenance dose   simvastatin (ZOCOR) 20 MG tablet 20 mg, Oral, Every evening   spironolactone (ALDACTONE) 100 MG tablet 100 mg, Oral, Daily (standard)   triamcinolone (KENALOG) 0.1 % ointment Apply the medication twice daily to affected areas of the skin until smooth. Then stop and re-start as soon as the skin changes come back.   zolpidem (AMBIEN) 5 MG tablet 5 mg, Oral, Nightly PRN       Allergies   Patient has no known allergies.     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 2 diabetes mellitus (CMS-HCC) 12/02/2022    Dry eye syndrome, bilateral 12/21/2020    Dysphagia 02/02/2023    Endometrial cancer (CMS-HCC) 2007    Esophageal varices in cirrhosis (CMS-HCC) 06/17/2022    Gastroparesis     Generalized pruritus 02/02/2023    H/O diabetic gastroparesis 01/09/2021    Heart disease     Hemorrhage of rectum and anus 03/17/2011    Hepatic cirrhosis (CMS-HCC) 06/17/2022    Hepatic encephalopathy (CMS-HCC) 06/17/2022    Hepatic steatosis 03/13/2021 Noted on CT Fall 2021    High cholesterol     Hx of psoriasis 08/06/2022  Hyperlipidemia 12/12/2015    Last Assessment & Plan:   The current medical regimen is effective;  continue present plan and medications.    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    Liver disease     Major depression     Morbid obesity (CMS-HCC) 11/10/2019    Myopia of both eyes with astigmatism and presbyopia 12/02/2022    Nausea 09/22/2022    Non-alcoholic fatty liver disease     NPDR (nonproliferative diabetic retinopathy) (CMS-HCC)     Right eye    Other ascites 09/22/2022    Other chronic pain 11/06/2022    Other insomnia 11/06/2022    PFD (pelvic floor dysfunction) 04/07/2013    Portal hypertension (CMS-HCC) 10/08/2022    Psoriasis     Ptosis of eyelid, left     Left upper eyelid    Reflux     Retinopathy of left eye, background, proliferative 01/09/2021    Right upper quadrant abdominal pain 09/22/2022    Secondary esophageal varices without bleeding (CMS-HCC) 06/17/2022    Type 2 diabetes mellitus, with long-term current use of insulin (CMS-HCC) 12/12/2015    Last Assessment & Plan:   The current medical regimen is effective;  continue present plan and medications.  Patient will continue good management of diabetes especially with weight loss. Hopefully with further weight loss will run into the problem of being overmedicated and having to cut back on medications     Some confusion on diabetes medications was have not.been refilled for over a year.  Pa       Social History   Tobacco use:   reports that she has never smoked. She has been exposed to tobacco smoke. She has never used smokeless tobacco.  Alcohol use:   reports that she does not currently use alcohol.  Drug use:  reports that she does not currently use drugs.    Family History     Family History   Problem Relation Age of Onset    Diabetes Mother     Cancer Mother     No Known Problems Father     No Known Problems Sister     Diabetes Maternal Grandmother Hypertension Maternal Grandmother     Cancer Maternal Grandfather     Diabetes Maternal Grandfather     No Known Problems Paternal Grandmother     Skin cancer Paternal Grandfather     Stroke Paternal Grandfather     No Known Problems Brother     No Known Problems Maternal Aunt     No Known Problems Maternal Uncle     No Known Problems Paternal Aunt     No Known Problems Paternal Uncle     No Known Problems Other     Melanoma Neg Hx     Basal cell carcinoma Neg Hx     Squamous cell carcinoma Neg Hx     Substance Abuse Disorder Neg Hx     Alcohol abuse Neg Hx     Drug abuse Neg Hx     Mental illness Neg Hx     Amblyopia Neg Hx     Blindness Neg Hx     Cataracts Neg Hx     Glaucoma Neg Hx     Macular degeneration Neg Hx     Retinal detachment Neg Hx     Strabismus Neg Hx     Thyroid disease Neg Hx     Breast cancer Neg Hx  Surgical History     Past Surgical History:   Procedure Laterality Date    CHOLECYSTECTOMY      HYSTERECTOMY      endometrial cancer    HYSTERECTOMY      OOPHORECTOMY      PR ANAL PRESSURE RECORD Left 03/31/2013    Procedure: ANORECTAL MANOMETRY;  Surgeon: None None;  [facility];  Service: Gastroenterology    PR BREATH HYDROGEN TEST N/A 03/31/2013    Procedure: BREATH HYDROGEN TEST;  Surgeon: None None;  Location: [facility];  Service: Gastroenterology    PR COLSC FLX W/RMVL OF TUMOR POLYP LESION SNARE TQ N/A 09/06/2020    Procedure: COLONOSCOPY FLEX; W/REMOV TUMOR/LES BY SNARE;  Surgeon: Chriss Driver, MD;  [facility]: [facility];  Service: Gastroenterology    PR UPPER GI ENDOSCOPY,BIOPSY N/A 04/15/2013    Procedure: UGI ENDOSCOPY; WITH BIOPSY, SINGLE OR MULTIPLE;  Surgeon: Vickii Chafe, MD;  Location: [facility];  Service: Gastroenterology    PR UPPER GI ENDOSCOPY,BIOPSY N/A 09/06/2020    Procedure: UGI ENDOSCOPY; WITH BIOPSY, SINGLE OR MULTIPLE;  Surgeon: Chriss Driver, MD;  Location: [facility];  Service: Gastroenterology    PR UPPER GI ENDOSCOPY,BIOPSY N/A 05/05/2022    Procedure: UGI ENDOSCOPY; WITH BIOPSY, SINGLE OR MULTIPLE;  Surgeon: Chriss Driver, MD;  Location: [facility];  Service: Gastroenterology    PR UPPER GI ENDOSCOPY,BIOPSY N/A 01/22/2023    Procedure: UGI ENDOSCOPY; WITH BIOPSY, SINGLE OR MULTIPLE;  Surgeon: Vonda Antigua, MD;  Location: [facility];  Service: Gastroenterology

## 2023-04-15 NOTE — Unmapped (Signed)
Large Volume Paracentesis Procedure Note (CPT [phone])    Pre-procedural Planning     Patient Name:: Ashley Briggs  Patient MRN: [id number]    Indications:  Diuretic refractory ascites    Known Bleeding Diathesis: Patient/caregiver denies any known bleeding or platelet disorder.     Antiplatelet Agents: This patient is on ASA 81 mg daily    Systemic Anticoagulation: This patient is not on full systemic anticoagulation.    Significant Labs:  INR   Date Value Ref Range Status   04/15/2023 1.40  Final   01/31/2011 1.1  Final     PT   Date Value Ref Range Status   04/15/2023 15.4 (H) 9.9 - 12.6 sec Final   01/31/2011 12.2 9.7 - 12.6 SECONDS Final     APTT   Date Value Ref Range Status   09/13/2022 33.6 24.8 - 38.4 sec Final   01/31/2011 30.7 24.1 - 32.5 SECONDS Final     Platelet   Date Value Ref Range Status   04/15/2023 220 150 - 450 10*9/L Final   01/31/2011 256 150 - 440 x10 9th/L Final       Consent: Informed consent for the procedure was obtained from Patient after explanation of the risks, including bleeding, infection, bowel perforation, and leaking, and benefits, including symptomatic improvement and diagnostic information. Potential alternatives were discussed and all questions answered.  Refer to the consent documentation.     Procedure Details     Time-out was performed immediately prior to the procedure.    The head of the bed was placed 45 degrees above level and a large area of ascitic fluid was identified using ultrasound in the right lower quadrant.  The linear probe with color doppler was used to ensure that a vessel was not located in the proposed needle path.        Skin was cleaned with Chlorhexidine. Local anesthesia with 1 percent lidocaine was introduced subcutaneously then deep to the skin until the parietal peritoneum was anesthetized. A small incision was made at the skin site.  A Caldwell needle with a catheter was introduced into this site until ascitic fluid was encountered and the catheter was introduced over the needle.  Ascitic fluid and the catheter were removed with minimal bleeding. Pressure bandage was applied after holding pressure and achieving hemostasis.    Albumin 5%/25% 0 grams was administered at the time of paracentesis.     Findings     4.2 liters of yellow ascites fluid was obtained.    The ascites fluid was sent for Cell count and diff, Culture, and Albumin.    Condition     The patient tolerated the procedure well and remains in the same condition as pre-procedure.    Complications     None; patient tolerated the procedure well.      Requesting Service: Med Undesignated (MDX)    Time Requested: 1300  Time Completed: 1700  Comments:     I personally assisted with the bedside ultrasound, images obtained are included.    I personally performed the entire procedure.     Siri Cole, MD

## 2023-04-15 NOTE — Progress Notes (Signed)
Name: Abigail Molina MRN: [id number] DOB: 09/03/1966    CHIEF COMPLAINT:  EXCESSIVE DAYTIME SLEEPINESS   HISTORY OF PRESENT ILLNESS: Patient is seen today for problems and issues with sleep related to excessive daytime sleepiness Patient  has been having sleep problems for many years Patient has been having excessive daytime sleepiness for a long time Patient has been having extreme fatigue and tiredness, lack of energy +  very Loud snoring every night + Nonrefreshing sleep Patient had a sleep study many many years ago however did not show any significant results per patient report  At this time we will need to assess for sleep apnea will perform home sleep study  Encouraged proper weight management.  Discussed driving precautions and its relationship with hypersomnolence.  Discussed operating dangerous equipment and its relationship with hypersomnolence.  Discussed sleep hygiene, and benefits of a fixed sleep waked time.  The importance of getting eight or more hours of sleep discussed with patient.  Discussed limiting the use of the computer and television before bedtime.  Decrease naps during the day, so night time sleep will become enhanced.  Limit caffeine, and sleep deprivation.  HTN, stroke, and heart failure are potential risk factors.    EPWORTH SLEEP SCORE  6  Patient is non-smoker Nonalcoholic Patient has gained 30 pounds in the last 2 weeks Patient has a diagnosis of liver cirrhosis Plan is to get further assessment in [facility]    PAST MEDICAL HISTORY :   has a past medical history of Anemia, Anxiety, Arthritis, Cancer (HCC), Chronic pain, Cirrhosis of liver (HCC), Depression, Diabetes mellitus without complication (HCC), Fatty liver disease, nonalcoholic (2021), GERD (gastroesophageal reflux disease), Glaucoma, Hyperlipidemia, Hypertension, Meningioma (HCC), Overactive bladder, and Psoriasis (2015).  has a past surgical history that includes Abdominal  hysterectomy (2007) and Cholecystectomy (2000). Prior to Admission medications   Medication Sig Start Date End Date Taking? Authorizing Provider  aspirin 81 MG tablet Take 81 mg by mouth daily.    [provider]  buPROPion (WELLBUTRIN XL) 150 MG 24 hr tablet Take 1 tablet (150 mg total) by mouth daily. 01/20/23 02/19/23  Neysa Hotter, MD  canagliflozin (INVOKANA) 300 MG TABS tablet Take by mouth. 02/17/22   [provider]  carvedilol (COREG) 6.25 MG tablet Take 6.25 mg by mouth 2 (two) times daily. 06/19/22   [provider]  citalopram (CELEXA) 40 MG tablet Take 1 tablet (40 mg total) by mouth every morning. 10/30/22 01/28/23  Hisada, Barbee Cough, MD  COSENTYX SENSOREADY, 300 MG, 150 MG/ML SOAJ Inject into the skin.    [provider]  Dulaglutide (TRULICITY) 1.5 MG/0.5ML SOPN Inject 1.5 mg into the skin once a week. Thursdays    [provider]  Ferrous Sulfate (SLOW FE) 142 (45 Fe) MG TBCR Take by mouth. 09/12/21   [provider]  furosemide (LASIX) 20 MG tablet Take 20 mg by mouth daily.    [provider]  Insulin Pen Needle 32G X 6 MM MISC 1 Syringe by Does not apply route. Use with Victoza.    [provider]  lactulose (CHRONULAC) 10 GM/15ML solution SMARTSIG:Milliliter(s) By Mouth 06/19/22   [provider]  LANTUS SOLOSTAR 100 UNIT/ML Solostar Pen Inject 66 Units into the skin daily. 11/25/22 11/25/23  [provider]  metFORMIN (GLUCOPHAGE-XR) 500 MG 24 hr tablet Take 1,000 mg by mouth 2 (two) times daily.    [provider]  mirtazapine (REMERON) 7.5 MG tablet Take 1 tablet (7.5 mg total) by  mouth at bedtime. 03/03/23 05/02/23  Neysa Hotter, MD  pantoprazole (PROTONIX) 40 MG tablet TAKE ONE TABLET BY MOUTH EVERY DAY 10/24/19   McGowan, Carollee Herter A, PA-C  promethazine (PHENERGAN) 12.5 MG tablet Take by mouth. 05/23/22   [provider]  simvastatin (ZOCOR) 20 MG tablet TAKE ONE TABLET BY MOUTH  EVERY EVENING 10/24/19   McGowan, Carollee Herter A, PA-C  spironolactone (ALDACTONE) 100 MG tablet Take 100 mg by mouth 2 (two) times daily.    [provider]  triamcinolone ointment (KENALOG) 0.1 % Apply topically.    [provider]  XIFAXAN 550 MG TABS tablet Take 550 mg by mouth 2 (two) times daily.    [provider]  zolpidem (AMBIEN) 5 MG tablet Take 1 tablet (5 mg total) by mouth at bedtime as needed. 04/05/23 05/05/23  Neysa Hotter, MD   No Known Allergies  FAMILY HISTORY:  family history includes Alcohol abuse in her cousin and maternal grandmother; Bipolar disorder in her cousin; Depression in her mother; Diabetes in her mother; Heart disease in her maternal grandfather; Hypertension in her mother; Obesity in her mother; Stroke in her paternal grandfather; Vision loss in her mother. SOCIAL HISTORY:  reports that she has never smoked. She has never used smokeless tobacco. She reports that she does not currently use alcohol. She reports that she does not use drugs.   Review of Systems:  Gen:  Denies  fever, sweats, chills weight loss  HEENT: Denies blurred vision, double vision, ear pain, eye pain, hearing loss, nose bleeds, sore throat Cardiac:  No dizziness, chest pain or heaviness, chest tightness,edema, No JVD Resp:   No cough, -sputum production, -shortness of breath,-wheezing, -hemoptysis,  Gi: Denies swallowing difficulty, stomach pain, nausea or vomiting, diarrhea, constipation, bowel incontinence Gu:  Denies bladder incontinence, burning urine Ext:   Denies Joint pain, stiffness or swelling Skin: Denies  skin rash, easy bruising or bleeding or hives Endoc:  Denies polyuria, polydipsia , polyphagia or weight change Psych:   Denies depression, insomnia or hallucinations  Other:  All other systems negative   ALL OTHER ROS ARE NEGATIVE   BP 124/70 (BP Location: Left Arm, Cuff Size: Normal)   Pulse 100   Temp 98.4 F (36.9 C) (Temporal)   Ht 5\' 4"   (1.626 m)   Wt 233 lb 3.2 oz (105.8 kg)   LMP 10/15/2016   SpO2 97%   BMI 40.03 kg/m    Physical Examination:   General Appearance: No distress  EYES PERRLA, EOM intact.   NECK Supple, No JVD Pulmonary: normal breath sounds, No wheezing.  CardiovascularNormal S1,S2.  No m/r/g.   Abdomen: Benign, Soft, non-tender.  Distended Skin:   warm, no rashes, no ecchymosis  Extremities: + Edema. Neuro:without focal findings,  speech normal  PSYCHIATRIC: Mood, affect within normal limits.   ALL OTHER ROS ARE NEGATIVE    ASSESSMENT AND PLAN SYNOPSIS  Patient with signs and symptoms of excessive daytime sleepiness with probable underlying diagnosis of obstructive sleep apnea in the setting of obesity and deconditioned state with underlying liver cirrhosis   Recommend Sleep Study for definitve diagnosis Home sleep study ordered  Obesity -recommend significant weight loss -recommend changing diet  Deconditioned state -Recommend increased daily activity and exercise  Liver cirrhosis Patient to follow-up in [facility] for further assessment  MEDICATION ADJUSTMENTS/LABS AND TESTS ORDERED: Recommend Sleep Study   CURRENT MEDICATIONS REVIEWED AT LENGTH WITH PATIENT TODAY   Patient  satisfied with Plan of action and management. All questions answered  Follow up  3 months  Total Time Spent  35 mins   Lucie Leather, M.D.  [facility]  Medical Director [facility] Medical Director [facility] Cardio-Pulmonary Department

## 2023-04-15 NOTE — Patient Instructions (Addendum)
Recommend Home Sleep Study to assess for sleep apnea  Patient to follow up in [facility] for liver cirrhosis

## 2023-04-16 LAB — COMPREHENSIVE METABOLIC PANEL
ALBUMIN: 2.2 g/dL — ABNORMAL LOW (ref 3.4–5.0)
ALKALINE PHOSPHATASE: 369 U/L — ABNORMAL HIGH (ref 46–116)
ALT (SGPT): 17 U/L (ref 10–49)
ANION GAP: 10 mmol/L (ref 5–14)
AST (SGOT): 51 U/L — ABNORMAL HIGH (ref <=34)
BILIRUBIN TOTAL: 2.5 mg/dL — ABNORMAL HIGH (ref 0.3–1.2)
BLOOD UREA NITROGEN: 7 mg/dL — ABNORMAL LOW (ref 9–23)
BUN / CREAT RATIO: 14
CALCIUM: 9.3 mg/dL (ref 8.7–10.4)
CHLORIDE: 103 mmol/L (ref 98–107)
CO2: 25.5 mmol/L (ref 20.0–31.0)
CREATININE: 0.5 mg/dL — ABNORMAL LOW
EGFR CKD-EPI (2021) FEMALE: >90 mL/min/{1.73_m2} (ref >=60)
GLUCOSE RANDOM: 113 mg/dL (ref 70–179)
POTASSIUM: 3.6 mmol/L (ref 3.4–4.8)
PROTEIN TOTAL: 7.1 g/dL (ref 5.7–8.2)
SODIUM: 138 mmol/L (ref 135–145)

## 2023-04-16 LAB — MAGNESIUM: MAGNESIUM: 1.1 mg/dL — ABNORMAL LOW (ref 1.6–2.6)

## 2023-04-16 LAB — CBC
HEMATOCRIT: 31.1 % — ABNORMAL LOW (ref 34.0–44.0)
HEMOGLOBIN: 9.8 g/dL — ABNORMAL LOW (ref 11.3–14.9)
MEAN CORPUSCULAR HEMOGLOBIN CONC: 31.6 g/dL — ABNORMAL LOW (ref 32.0–36.0)
MEAN CORPUSCULAR HEMOGLOBIN: 23.2 pg — ABNORMAL LOW (ref 25.9–32.4)
MEAN CORPUSCULAR VOLUME: 73.5 fL — ABNORMAL LOW (ref 77.6–95.7)
MEAN PLATELET VOLUME: 7.5 fL (ref 6.8–10.7)
PLATELET COUNT: 220 10*9/L (ref 150–450)
RED BLOOD CELL COUNT: 4.23 10*12/L (ref 3.95–5.13)
RED CELL DISTRIBUTION WIDTH: 21 % — ABNORMAL HIGH (ref 12.2–15.2)
WBC ADJUSTED: 2.8 10*9/L — ABNORMAL LOW (ref 3.6–11.2)

## 2023-04-16 LAB — PROTIME-INR
INR: 1.34
PROTIME: 14.7 s — ABNORMAL HIGH (ref 9.9–12.6)

## 2023-04-16 MED ADMIN — lactulose (CEPHULAC) packet 20 g: 20 g | ORAL | @ 20:00:00

## 2023-04-16 MED ADMIN — carvedilol (COREG) tablet 6.25 mg: 6.25 mg | ORAL | @ 01:00:00

## 2023-04-16 MED ADMIN — insulin lispro (HumaLOG) injection 10 Units: 10 [IU] | SUBCUTANEOUS | @ 14:00:00

## 2023-04-16 MED ADMIN — magnesium sulfate 2gm/50mL IVPB: 2 g | INTRAVENOUS | @ 16:00:00 | Stop: 2023-04-16

## 2023-04-16 MED ADMIN — furosemide (LASIX) tablet 40 mg: 40 mg | ORAL | @ 20:00:00

## 2023-04-16 MED ADMIN — mirtazapine (REMERON) tablet 7.5 mg: 7.5 mg | ORAL | @ 01:00:00

## 2023-04-16 MED ADMIN — pantoprazole (Protonix) EC tablet 40 mg: 40 mg | ORAL | @ 01:00:00

## 2023-04-16 MED ADMIN — metFORMIN (GLUCOPHAGE) tablet 1,000 mg: 1000 mg | ORAL | @ 22:00:00

## 2023-04-16 MED ADMIN — pantoprazole (Protonix) EC tablet 40 mg: 40 mg | ORAL | @ 14:00:00

## 2023-04-16 MED ADMIN — metFORMIN (GLUCOPHAGE) tablet 1,000 mg: 1000 mg | ORAL | @ 14:00:00

## 2023-04-16 MED ADMIN — HYDROcodone-acetaminophen (NORCO) 5-325 mg per tablet 1 tablet: 1 | ORAL | @ 05:00:00 | Stop: 2023-04-16

## 2023-04-16 MED ADMIN — rifAXIMin (XIFAXAN) tablet 550 mg: 550 mg | ORAL | @ 14:00:00 | Stop: 2023-04-22

## 2023-04-16 MED ADMIN — rifAXIMin (XIFAXAN) tablet 550 mg: 550 mg | ORAL | @ 01:00:00 | Stop: 2023-04-22

## 2023-04-16 MED ADMIN — spironolactone (ALDACTONE) tablet 100 mg: 100 mg | ORAL | @ 20:00:00

## 2023-04-16 MED ADMIN — HYDROcodone-acetaminophen (NORCO) 5-325 mg per tablet 1 tablet: 1 | ORAL | @ 22:00:00 | Stop: 2023-04-21

## 2023-04-16 MED ADMIN — aspirin chewable tablet 81 mg: 81 mg | ORAL | @ 14:00:00

## 2023-04-16 MED ADMIN — spironolactone (ALDACTONE) tablet 100 mg: 100 mg | ORAL | @ 10:00:00

## 2023-04-16 MED ADMIN — furosemide (LASIX) tablet 40 mg: 40 mg | ORAL | @ 10:00:00

## 2023-04-16 MED ADMIN — insulin glargine (LANTUS) injection 66 Units: 66 [IU] | SUBCUTANEOUS | @ 01:00:00

## 2023-04-16 MED ADMIN — HYDROcodone-acetaminophen (NORCO) 5-325 mg per tablet 1 tablet: 1 | ORAL | @ 14:00:00 | Stop: 2023-04-21

## 2023-04-16 MED ADMIN — lactulose (CEPHULAC) packet 20 g: 20 g | ORAL | @ 14:00:00

## 2023-04-16 MED ADMIN — pravastatin (PRAVACHOL) tablet 40 mg: 40 mg | ORAL | @ 01:00:00

## 2023-04-16 MED ADMIN — insulin lispro (HumaLOG) injection 10 Units: 10 [IU] | SUBCUTANEOUS | @ 22:00:00

## 2023-04-16 MED ADMIN — lactulose (CEPHULAC) packet 20 g: 20 g | ORAL | @ 01:00:00

## 2023-04-16 MED ADMIN — magnesium sulfate 2gm/50mL IVPB: 2 g | INTRAVENOUS | @ 14:00:00 | Stop: 2023-04-16

## 2023-04-16 MED ADMIN — cholestyramine (QUESTRAN) 4 gram packet 1 packet: 1 | ORAL | @ 22:00:00

## 2023-04-16 NOTE — Unmapped (Signed)
Pt in with ascites.  VSS, c/o pain to right shoulder, relieved with po Norco tablet.  S/p paracentesis, tolerated without difficulty.  Stict intake and output maintained.  Independent with ADLS.  Skin and falls protocol maintained, will continue to work towards prioritized goals.    Problem: Adult Inpatient Plan of Care  Goal: Plan of Care Review  Outcome: Progressing     Problem: Fluid Volume Excess  Goal: Fluid Balance  Outcome: Progressing     Problem: Fall Injury Risk  Goal: Absence of Fall and Fall-Related Injury  Outcome: Progressing

## 2023-04-16 NOTE — Unmapped (Signed)
Pt admitted for ascities, 4.5L removed via paracentesis 5/15.  Pt A&Ox4, VSS, RA, afebrile, pain controlled with norco q8.  Pt has current RUE sling for recent fall, dog on leash pulled her down.  Pt independent.  Rash on body.  Pt receiving oral diuretics.  Pt states she has had an increasingly difficult time swallowing, pills and saliva.  Provider to bedside to discuss additional symptoms/concerns.  Pt took pills whole in applesauce well.  Pt rested, but slept minimally overnight.  Call bell within reach, will continue to monitor.    Problem: Adult Inpatient Plan of Care  Goal: Plan of Care Review  Outcome: Progressing  Goal: Patient-Specific Goal (Individualized)  Outcome: Progressing  Goal: Absence of Hospital-Acquired Illness or Injury  Outcome: Progressing  Intervention: Identify and Manage Fall Risk  Recent Flowsheet Documentation  Taken 04/15/2023 2000 by Cherlynn Perches, RN  Safety Interventions:   fall reduction program maintained   lighting adjusted for tasks/safety   low bed   nonskid shoes/slippers when out of bed  Intervention: Prevent Skin Injury  Recent Flowsheet Documentation  Taken 04/16/2023 0200 by Cherlynn Perches, RN  Positioning for Skin: Supine/Back  Taken 04/16/2023 0000 by Cherlynn Perches, RN  Positioning for Skin: Left  Taken 04/15/2023 2200 by Cherlynn Perches, RN  Positioning for Skin: Supine/Back  Taken 04/15/2023 2000 by Cherlynn Perches, RN  Positioning for Skin: Supine/Back  Device Skin Pressure Protection:   absorbent pad utilized/changed   adhesive use limited   pressure points protected  Skin Protection:   adhesive use limited   incontinence pads utilized   protective footwear used   transparent dressing maintained   tubing/devices free from skin contact  Goal: Optimal Comfort and Wellbeing  Outcome: Progressing  Goal: Readiness for Transition of Care  Outcome: Progressing  Goal: Rounds/Family Conference  Outcome: Progressing     Problem: Fall Injury Risk  Goal: Absence of Fall and Fall-Related Injury  Outcome: Progressing  Intervention: Promote Scientist, clinical (histocompatibility and immunogenetics) Documentation  Taken 04/15/2023 2000 by Cherlynn Perches, RN  Safety Interventions:   fall reduction program maintained   lighting adjusted for tasks/safety   low bed   nonskid shoes/slippers when out of bed     Problem: Fluid Volume Excess  Goal: Fluid Balance  Outcome: Progressing  Intervention: Monitor and Manage Hypervolemia  Recent Flowsheet Documentation  Taken 04/15/2023 2000 by Cherlynn Perches, RN  Skin Protection:   adhesive use limited   incontinence pads utilized   protective footwear used   transparent dressing maintained   tubing/devices free from skin contact

## 2023-04-16 NOTE — Unmapped (Signed)
Hospital medicine Daily Progress Note    Assessment/Plan:    Principal Problem:    Other ascites  Active Problems:    Hypertension    Diabetes mellitus (CMS-HCC)    Hepatic cirrhosis (CMS-HCC)    Hepatic encephalopathy (CMS-HCC)    Portal hypertension (CMS-HCC)  Resolved Problems:    * No resolved hospital problems. *                 Ashley Briggs is a 57 y.o. female who is presenting to [facility] with abdominal discomfort and ascites, in the setting of the following pertinent/contributing co-morbidities: DM, cirrhosis, HTN .     Ascites/Hepatic cirrhosis with history of HE, ascites, varices:  Presents primarily with weight gain and evidence of worsening ascites and LE edema. Reports compliance with medications and salt intake but recent increase in fluid intake related to dry mouth.  She did not realize this can worsen her fluid status/ascites.  Suspect this is significant contributor.  No other obvious source, LFTs stable from previous, no pain, fever to suggest other complications. No evidence of GI bleeding, hepatic encephalopathy. LVP with - liver doppler ordered today to rule out clot  - Follow I/Os, weights  - Continue increased diuresis with furosemide 40 mg bid and spironolactone 100 mg bid  - Monitor kidney function  - Continue lactulose and rifaximin at current dose  - Continue carvedilol    Dysphagia  Patient reports increasing frequency of choking on her food intake and secretions, reports this has been ongoing but slightly increased since her EGD in 01/2023. 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 Path results as follows: Fragments of esophageal squamous epithelium with increased intraepithelial lymphocytic infiltrate, nonspecific; cannot exclude lichenoid esophagitis in the appropriate clinical setting. That said, reports symptoms in both upper throat and chest.   - Speech consult  - May warrant repeat endoscopy either as inpatient vs outpatient  - Depending on symptom severity, may discuss with GI tomorrow     Secondary/Additional Active Problems:  Hypertension:  Normal pressures on arrival.  On carvedilol (also for varices/portal HTN).  -Continue carvedilol at usual dose     Diabetes mellitus (CMS-HCC):  Last HbA1c 8.4% in March.  Now on glargine insulin with short acting with meals as well SGLT2 inhibitor, GLP1 agonist and metformin. Follow[facility] with [facility] and has upcoming appointment with her outpatient provider in June. Patient reports continue suboptimal control, likely needs further titration of her insulin as she is on appropriate adjunctive medication.  -Continue glargine insulin with lispro mealtime doses as per usual regimen  -Continue metformin  -Hold SGLT2 and GLP1 agents as non formulary but can restart on discharge  -Unless marked elevations, likely defer changes to outpatient as will not be on usual regimen here    Microcytic anemia  No overt bleeding but at risk for bleeding with EV and PHG (noted on EGD 01/2023).  - Check iron studies    Hypomagnesemia  - Replete     Prophylaxis  -ambulation     Diet  -Low salt, fluid restriction     Code Status / HCDM  -Full Code, Discussed with patient at the time of admission   -  HCDM (patient stated preference): Caudle,Ryan - Relative - [phone]  ___________________________________________________________________    Subjective:  Patient's abdominal distention is feeling improved after her LVP yesterday, 4.2L removed. Still has some swelling in her legs. Awaiting liver doppler so NPO at time of my visit. She otherwise complains about choking  on her secretions and food and liquids. She states this has been happening for a while but increasing in frequency. She noted it several times overnight. Feels this sensation in upper throat and as well as a little bit lower in the chest.     Recent Results (from the past 24 hour(s))   Body fluid cell count    Collection Time: 04/15/23  4:05 PM   Result Value Ref Range    Fluid Type Fluid, Peritoneal Color, Fluid Yellow     Appearance, Fluid Hazy     Nucleated Cells, Fluid 167 ul    RBC, Fluid 448 ul    Lymphocytes %, Fluid 58.0 %    Mono/Macro % , Fluid 27.0 %    Other Cells %, Fluid 15.0 %    #Cells Counted BF Diff 100    Ascitic/Peritoneal Fluid Culture    Collection Time: 04/15/23  4:05 PM    Specimen: Peritoneum; Fluid, Peritoneal   Result Value Ref Range    Ascitic/Peritoneal Culture NO GROWTH TO DATE    Albumin Level, Body Fluid    Collection Time: 04/15/23  4:05 PM   Result Value Ref Range    Albumin, Fluid <0.5 Undefined g/dL    Albumin, Fluid Type Fluid, Peritoneal    POCT Glucose    Collection Time: 04/15/23  4:31 PM   Result Value Ref Range    Glucose, POC 285 (H) 70 - 179 mg/dL   Magnesium Level    Collection Time: 04/16/23  6:23 AM   Result Value Ref Range    Magnesium 1.1 (L) 1.6 - 2.6 mg/dL   PT-INR    Collection Time: 04/16/23  6:23 AM   Result Value Ref Range    PT 14.7 (H) 9.9 - 12.6 sec    INR 1.34    Comprehensive metabolic panel    Collection Time: 04/16/23  6:23 AM   Result Value Ref Range    Sodium 138 135 - 145 mmol/L    Potassium 3.6 3.4 - 4.8 mmol/L    Chloride 103 98 - 107 mmol/L    CO2 25.5 20.0 - 31.0 mmol/L    Anion Gap 10 5 - 14 mmol/L    BUN 7 (L) 9 - 23 mg/dL    Creatinine 1.61 (L) 0.55 - 1.02 mg/dL    BUN/Creatinine Ratio 14     eGFR CKD-EPI (2021) Female >90 >=60 mL/min/1.66m2    Glucose 113 70 - 179 mg/dL    Calcium 9.3 8.7 - 09.6 mg/dL    Albumin 2.2 (L) 3.4 - 5.0 g/dL    Total Protein 7.1 5.7 - 8.2 g/dL    Total Bilirubin 2.5 (H) 0.3 - 1.2 mg/dL    AST 51 (H) <=04 U/L    ALT 17 10 - 49 U/L    Alkaline Phosphatase 369 (H) 46 - 116 U/L   CBC    Collection Time: 04/16/23  6:24 AM   Result Value Ref Range    WBC 2.8 (L) 3.6 - 11.2 10*9/L    RBC 4.23 3.95 - 5.13 10*12/L    HGB 9.8 (L) 11.3 - 14.9 g/dL    HCT 54.0 (L) 98.1 - 44.0 %    MCV 73.5 (L) 77.6 - 95.7 fL    MCH 23.2 (L) 25.9 - 32.4 pg    MCHC 31.6 (L) 32.0 - 36.0 g/dL    RDW 19.1 (H) 47.8 - 15.2 %    MPV 7.5 6.8 - 10.7 fL  Platelet 220 150 - 450 10*9/L     Labs/Studies:  Labs and Studies from the last 24hrs per EMR and Reviewed    Objective:  Temp:  [36.7 ??C (98.1 ??F)] 36.7 ??C (98.1 ??F)  Heart Rate:  [86-103] 86  Resp:  [16] 16  BP: (109-128)/(55-61) 109/55  SpO2:  [98 %] 98 %    General: No acute distress.  Appears stated age.    HEENT: MMM, oropharynx clear  Cardiac: RRR, no M/R/G  Pulmonary: Normal work of breathing, clear bilaterally, no wheezes or crackles  Abdomen: Soft, mild distention but may be due to body habitus. Non-tender. LVP site clean and dry.    Skin: No jaundice. No rashes or lesions.  Extremities: Trace to 1+ edema in bLE, cyanosis, or clubbing. Right shoulder in sling.   Neuro: CN 2-12 intact, no gross motor deficits. No tremor or asterixis.   Psych: Oriented to self, place, and time. Normal speech and affect.

## 2023-04-16 NOTE — Unmapped (Signed)
Care Management  Initial Transition Planning Assessment  Type of Residence: Mailing Address:  [address]  [place] [address]  Contacts: Accompanied by: Alone  Patient Phone Number: [phone] (home)     Patient lives independently at home with cousin. Has no DME or HH services currently.        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), Cataract associated with type 2 diabetes mellitus (CMS-HCC) (12/21/2020), Cirrhosis (CMS-HCC), Coronary artery disease involving native heart without angina pectoris (02/05/2021), Current moderate episode of major depressive disorder (CMS-HCC) (11/06/2022), Diabetes mellitus (CMS-HCC), 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 type 2 diabetes mellitus (CMS-HCC) (12/02/2022), Dry eye syndrome, bilateral (12/21/2020), Dysphagia (02/02/2023), Endometrial cancer (CMS-HCC) (2007), Esophageal varices in cirrhosis (CMS-HCC) (06/17/2022), Gastroparesis, Generalized pruritus (02/02/2023), H/O diabetic gastroparesis (01/09/2021), Heart disease, Hemorrhage of rectum and anus (03/17/2011), Hepatic cirrhosis (CMS-HCC) (06/17/2022), Hepatic encephalopathy (CMS-HCC) (06/17/2022), Hepatic steatosis (03/13/2021), High cholesterol, psoriasis (08/06/2022), Hyperlipidemia (12/12/2015), Hypertension, 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), Liver disease, Major depression, Morbid obesity (CMS-HCC) (11/10/2019), Myopia of both eyes with astigmatism and presbyopia (12/02/2022), Nausea (09/22/2022), Non-alcoholic fatty liver disease, NPDR (nonproliferative diabetic retinopathy) (CMS-HCC), Other ascites (09/22/2022), Other chronic pain (11/06/2022), Other insomnia (11/06/2022), PFD (pelvic floor dysfunction) (04/07/2013), Portal hypertension (CMS-HCC) (10/08/2022), Psoriasis, Ptosis of eyelid, left, Reflux, Retinopathy of left eye, background, proliferative (01/09/2021), Right upper quadrant abdominal pain (09/22/2022), Secondary esophageal varices without bleeding (CMS-HCC) (06/17/2022), and Type 2 diabetes mellitus, with long-term current use of insulin (CMS-HCC) (12/12/2015).  Past Surgical History:   has a past surgical history that includes Hysterectomy; pr anal pressure record (Left, 03/31/2013); pr breath hydrogen test (N/A, 03/31/2013); pr upper gi endoscopy,biopsy (N/A, 04/15/2013); Hysterectomy; Cholecystectomy; pr colsc flx w/rmvl of tumor polyp lesion snare tq (N/A, 09/06/2020); pr upper gi endoscopy,biopsy (N/A, 09/06/2020); Oophorectomy; pr upper gi endoscopy,biopsy (N/A, 05/05/2022); and pr upper gi endoscopy,biopsy (N/A, 01/22/2023).   Previous admit date: 09/24/2022    Primary Insurance- Payor: [place] [employer] OF [place] / Plan: [place] [employer] CAID [employer] OF [place] / Product Type: *No Product type* /   Secondary Insurance - None  Prescription Coverage - see below  Preferred Pharmacy - [id number] - [place] ([place]), [place] - [address]  [facility]  [facility]  [facility] - [place], [place] - [address]    Transportation home: Quarry manager assessed the patient by : In person interview with patient  Orientation Level: Oriented X4  Functional level prior to admission: Independent  Reason for referral: Discharge Planning    Contact/Decision Maker  Extended Emergency Contact Information  Primary Emergency Contact: Caudle,Ryan  Mobile Phone: [phone]  Relation: Relative    Legal Next of Kin / Guardian / POA / Advance Directives     HCDM (patient stated preference): Caudle,Ryan - Relative - [phone]    Advance Directive (Medical Treatment)  Does patient have an advance directive covering medical treatment?: Patient does not have advance directive covering medical treatment.  Reason patient does not have an advance directive covering medical treatment:: Patient does  not wish to complete one at this time.    Health Care Decision Maker [HCDM] (Medical & Mental Health Treatment)  Healthcare Decision Maker: HCDM documented in the HCDM/Contact Info section.  Information offered on HCDM, Medical & Mental Health advance directives:: Patient declined information.    Advance Directive (Mental Health Treatment)  Does patient have an advance directive covering mental health treatment?: Patient does not have advance directive covering mental health treatment.  Reason patient does not have an advance directive covering mental health treatment:: Patient does not wish to complete one at this time.    Readmission Information    Have you been hospitalized in the last 30 days?: No           Patient Information  Lives with: Family members    Type of Residence: Private residence             Support Systems/Concerns: Friends/Neighbors, Family Members    Responsibilities/Dependents at home?: No    Home Care services in place prior to admission?: No          Outpatient/Community Resources in place prior to admission: Clinic       Equipment Currently Used at Home: none       Currently receiving outpatient dialysis?: No       Financial Information       Need for financial assistance?: No       Social Determinants of Health  Social Determinants of Health were addressed in provider documentation.  Please refer to patient history.  Social Determinants of Health     Financial Resource Strain: Low Risk  (04/16/2023)    Overall Financial Resource Strain (CARDIA)     Difficulty of Paying Living Expenses: Not hard at all   Internet Connectivity: No Internet connectivity concern identified (09/17/2022)    Internet Connectivity     Do you have access to internet services: Yes     How do you connect to the internet: Personal Device at home     Is your internet connection strong enough for you to watch video on your device without major problems?: Yes     Do you have enough data to get through the month?: Yes     Does at least one of the devices have a camera that you can use for video chat?: Yes   Food Insecurity: No Food Insecurity (04/16/2023)    Hunger Vital Sign     Worried About Running Out of Food in the Last Year: Never true     Ran Out of Food in the Last Year: Never true   Tobacco Use: Medium Risk (04/15/2023)    Patient History     Smoking Tobacco Use: Never     Smokeless Tobacco Use: Never     Passive Exposure: Current   Housing/Utilities: Low Risk  (04/16/2023)    Housing/Utilities     Within the past 12 months, have you ever stayed: outside, in a car, in a tent, in an overnight shelter, or temporarily in someone else's home (i.e. couch-surfing)?: No     Are you worried about losing your housing?: No     Within the past 12 months, have you been unable to get utilities (heat, electricity) when it was really needed?: No   Alcohol Use: Not At Risk (01/09/2021)    Alcohol Use     How often do you have a drink containing alcohol?: Monthly or less     How many drinks containing alcohol do you have  on a typical day when you are drinking?: 1 - 2     How often do you have 5 or more drinks on one occasion?: Never   Transportation Needs: No Transportation Needs (04/16/2023)    PRAPARE - Transportation     Lack of Transportation (Medical): No     Lack of Transportation (Non-Medical): No   Substance Use: Low Risk  (01/09/2021)    Substance Use     Taken prescription drugs for non-medical reasons: Never     Taken illegal drugs: Never     Patient indicated they have taken drugs in the past year for non-medical reasons: Yes, [positive answer(s)]: Not on file   Health Literacy: Low Risk  (01/09/2021)    Health Literacy     : Never   Physical Activity: Insufficiently Active (01/09/2021)    Exercise Vital Sign     Days of Exercise per Week: 1 day     Minutes of Exercise per Session: 40 min   Interpersonal Safety: Not at risk (09/17/2022)    Interpersonal Safety     Unsafe Where You Currently Live: No     Physically Hurt by Anyone: No     Abused by Anyone: No   Stress: Stress Concern Present (09/17/2022)    [employer] of Occupational Health - Occupational Stress Questionnaire     Feeling of Stress : Very much   Intimate Partner Violence: Not At Risk (01/09/2021)    Humiliation, Afraid, Rape, and Kick questionnaire     Fear of Current or Ex-Partner: No     Emotionally Abused: No     Physically Abused: No     Sexually Abused: No   Depression: Not at risk (07/12/2021)    PHQ-2     PHQ-2 Score: 0   Social Connections: Moderately Isolated (09/02/2018)    Received from [facility]    Social Connection and Isolation Panel [NHANES]     Frequency of Communication with Friends and Family: Once a week     Frequency of Social Gatherings with Friends and Family: Twice a week     Attends Religious Services: Never     Database administrator or Organizations: No     Attends Banker Meetings: Never     Marital Status: Widowed       Complex Discharge Information    Is patient identified as a difficult/complex discharge?: No                                                               Interventions:       Discharge Needs Assessment  Concerns to be Addressed: discharge planning    Clinical Risk Factors: Multiple Diagnoses (Chronic)    Barriers to taking medications: No    Prior overnight hospital stay or ED visit in last 90 days: No              Anticipated Changes Related to Illness: none    Equipment Needed After Discharge: none    Discharge Facility/Level of Care Needs: other (see comments) (Home with self care vs HH)    Readmission  Risk of Unplanned Readmission Score:  %  Predictive Model Details   No score data available for [facility] Risk of Unplanned Readmission  Readmitted Within the Last 30 Days? (No if blank)   Patient at risk for readmission?: No    Discharge Plan  Screen findings are: Discharge planning needs identified or anticipated (Comment). (TBD)    Expected Discharge Date: 04/17/2023    Expected Transfer from Critical Care:      Quality data for continuing care services shared with patient and/or representative?: Yes  Patient and/or family were provided with choice of facilities / services that are available and appropriate to meet post hospital care needs?: Yes       Initial Assessment complete?: Yes

## 2023-04-16 NOTE — Unmapped (Signed)
Patient is alert and oriented x4. NPO following breakfast for upcoming liver US. Meds whole, thin liquids, did not need applesauce for PO meds today. Continent of bladder and bowel. R shoulder pain managed with PRN norco x1. Insulin given AC, lunch dose held d/t NPO status. No new skin issues noted. Safety and fall precautions maintained. Call bell and belongings within reach, bed low and locked. Will continue to monitor.       Problem: Adult Inpatient Plan of Care  Goal: Plan of Care Review  Outcome: Progressing  Goal: Patient-Specific Goal (Individualized)  Outcome: Progressing  Goal: Absence of Hospital-Acquired Illness or Injury  Outcome: Progressing  Intervention: Identify and Manage Fall Risk  Recent Flowsheet Documentation  Taken 04/16/2023 1400 by Regenia Skeeter, RN  Safety Interventions: fall reduction program maintained  Taken 04/16/2023 1200 by Regenia Skeeter, RN  Safety Interventions: fall reduction program maintained  Taken 04/16/2023 1000 by Regenia Skeeter, RN  Safety Interventions: fall reduction program maintained  Taken 04/16/2023 0800 by Regenia Skeeter, RN  Safety Interventions: fall reduction program maintained  Intervention: Prevent Skin Injury  Recent Flowsheet Documentation  Taken 04/16/2023 1400 by Regenia Skeeter, RN  Positioning for Skin: Supine/Back  Device Skin Pressure Protection: absorbent pad utilized/changed  Skin Protection: incontinence pads utilized  Taken 04/16/2023 1200 by Regenia Skeeter, RN  Positioning for Skin: Left  Device Skin Pressure Protection: absorbent pad utilized/changed  Skin Protection: incontinence pads utilized  Taken 04/16/2023 1000 by Regenia Skeeter, RN  Positioning for Skin: Supine/Back  Device Skin Pressure Protection: absorbent pad utilized/changed  Skin Protection: incontinence pads utilized  Taken 04/16/2023 0800 by Regenia Skeeter, RN  Positioning for Skin: Supine/Back  Device Skin Pressure Protection: absorbent pad utilized/changed  Skin Protection: incontinence pads utilized  Goal: Optimal Comfort and Wellbeing  Outcome: Progressing  Goal: Readiness for Transition of Care  Outcome: Progressing  Goal: Rounds/Family Conference  Outcome: Progressing     Problem: Fall Injury Risk  Goal: Absence of Fall and Fall-Related Injury  Outcome: Progressing  Intervention: Promote Scientist, clinical (histocompatibility and immunogenetics) Documentation  Taken 04/16/2023 1400 by Regenia Skeeter, RN  Safety Interventions: fall reduction program maintained  Taken 04/16/2023 1200 by Regenia Skeeter, RN  Safety Interventions: fall reduction program maintained  Taken 04/16/2023 1000 by Regenia Skeeter, RN  Safety Interventions: fall reduction program maintained  Taken 04/16/2023 0800 by Regenia Skeeter, RN  Safety Interventions: fall reduction program maintained     Problem: Fluid Volume Excess  Goal: Fluid Balance  Outcome: Progressing  Intervention: Monitor and Manage Hypervolemia  Recent Flowsheet Documentation  Taken 04/16/2023 1400 by Regenia Skeeter, RN  Skin Protection: incontinence pads utilized  Taken 04/16/2023 1200 by Regenia Skeeter, RN  Skin Protection: incontinence pads utilized  Taken 04/16/2023 1000 by Regenia Skeeter, RN  Skin Protection: incontinence pads utilized  Taken 04/16/2023 0800 by Regenia Skeeter, RN  Skin Protection: incontinence pads utilized

## 2023-04-17 LAB — FERRITIN: FERRITIN: 107.8 ng/mL

## 2023-04-17 LAB — COMPREHENSIVE METABOLIC PANEL
ALBUMIN: 1.9 g/dL — ABNORMAL LOW (ref 3.4–5.0)
ALKALINE PHOSPHATASE: 340 U/L — ABNORMAL HIGH (ref 46–116)
ALT (SGPT): 15 U/L (ref 10–49)
ANION GAP: 10 mmol/L (ref 5–14)
AST (SGOT): 46 U/L — ABNORMAL HIGH (ref <=34)
BILIRUBIN TOTAL: 2.4 mg/dL — ABNORMAL HIGH (ref 0.3–1.2)
BLOOD UREA NITROGEN: 8 mg/dL — ABNORMAL LOW (ref 9–23)
BUN / CREAT RATIO: 15
CALCIUM: 8.7 mg/dL (ref 8.7–10.4)
CHLORIDE: 103 mmol/L (ref 98–107)
CO2: 25.3 mmol/L (ref 20.0–31.0)
CREATININE: 0.55 mg/dL
EGFR CKD-EPI (2021) FEMALE: >90 mL/min/{1.73_m2} (ref >=60)
GLUCOSE RANDOM: 142 mg/dL (ref 70–179)
POTASSIUM: 3.9 mmol/L (ref 3.5–5.1)
PROTEIN TOTAL: 6.7 g/dL (ref 5.7–8.2)
SODIUM: 138 mmol/L (ref 135–145)

## 2023-04-17 LAB — CBC
HEMATOCRIT: 30.9 % — ABNORMAL LOW (ref 34.0–44.0)
HEMOGLOBIN: 9.9 g/dL — ABNORMAL LOW (ref 11.3–14.9)
MEAN CORPUSCULAR HEMOGLOBIN CONC: 32 g/dL (ref 32.0–36.0)
MEAN CORPUSCULAR HEMOGLOBIN: 23.5 pg — ABNORMAL LOW (ref 25.9–32.4)
MEAN CORPUSCULAR VOLUME: 73.3 fL — ABNORMAL LOW (ref 77.6–95.7)
MEAN PLATELET VOLUME: 7.4 fL (ref 6.8–10.7)
PLATELET COUNT: 203 10*9/L (ref 150–450)
RED BLOOD CELL COUNT: 4.21 10*12/L (ref 3.95–5.13)
RED CELL DISTRIBUTION WIDTH: 20.7 % — ABNORMAL HIGH (ref 12.2–15.2)
WBC ADJUSTED: 3 10*9/L — ABNORMAL LOW (ref 3.6–11.2)

## 2023-04-17 LAB — MAGNESIUM: MAGNESIUM: 1.3 mg/dL — ABNORMAL LOW (ref 1.6–2.6)

## 2023-04-17 LAB — IRON PANEL
IRON SATURATION (CALC): 6 %
IRON: 17 ug/dL — ABNORMAL LOW
TOTAL IRON BINDING CAPACITY (CALC): 278.5 ug/dL (ref 250.0–425.0)
TRANSFERRIN: 221 mg/dL — ABNORMAL LOW

## 2023-04-17 MED ADMIN — carvedilol (COREG) tablet 6.25 mg: 6.25 mg | ORAL | @ 13:00:00

## 2023-04-17 MED ADMIN — magnesium oxide (MAG-OX) tablet 400 mg: 400 mg | ORAL | @ 18:00:00

## 2023-04-17 MED ADMIN — rifAXIMin (XIFAXAN) tablet 550 mg: 550 mg | ORAL | @ 13:00:00 | Stop: 2023-04-22

## 2023-04-17 MED ADMIN — lactulose (CEPHULAC) packet 20 g: 20 g | ORAL | @ 18:00:00

## 2023-04-17 MED ADMIN — lactulose (CEPHULAC) packet 20 g: 20 g | ORAL | @ 13:00:00

## 2023-04-17 MED ADMIN — pantoprazole (Protonix) EC tablet 40 mg: 40 mg | ORAL | @ 01:00:00

## 2023-04-17 MED ADMIN — cholestyramine (QUESTRAN) 4 gram packet 1 packet: 1 | ORAL | @ 23:00:00

## 2023-04-17 MED ADMIN — insulin lispro (HumaLOG) injection 10 Units: 10 [IU] | SUBCUTANEOUS | @ 17:00:00

## 2023-04-17 MED ADMIN — furosemide (LASIX) tablet 40 mg: 40 mg | ORAL | @ 10:00:00

## 2023-04-17 MED ADMIN — magnesium sulfate 2gm/50mL IVPB: 2 g | INTRAVENOUS | @ 21:00:00 | Stop: 2023-04-17

## 2023-04-17 MED ADMIN — insulin lispro (HumaLOG) injection 10 Units: 10 [IU] | SUBCUTANEOUS

## 2023-04-17 MED ADMIN — rifAXIMin (XIFAXAN) tablet 550 mg: 550 mg | ORAL | @ 01:00:00 | Stop: 2023-04-22

## 2023-04-17 MED ADMIN — magnesium sulfate 2gm/50mL IVPB: 2 g | INTRAVENOUS | @ 18:00:00 | Stop: 2023-04-17

## 2023-04-17 MED ADMIN — carvedilol (COREG) tablet 6.25 mg: 6.25 mg | ORAL | @ 01:00:00

## 2023-04-17 MED ADMIN — furosemide (LASIX) tablet 40 mg: 40 mg | ORAL | @ 18:00:00

## 2023-04-17 MED ADMIN — metFORMIN (GLUCOPHAGE) tablet 1,000 mg: 1000 mg | ORAL | @ 23:00:00

## 2023-04-17 MED ADMIN — HYDROcodone-acetaminophen (NORCO) 5-325 mg per tablet 1 tablet: 1 | ORAL | @ 21:00:00 | Stop: 2023-04-21

## 2023-04-17 MED ADMIN — HYDROcodone-acetaminophen (NORCO) 5-325 mg per tablet 1 tablet: 1 | ORAL | @ 11:00:00 | Stop: 2023-04-21

## 2023-04-17 MED ADMIN — rifAXIMin (XIFAXAN) tablet 550 mg: 550 mg | ORAL | Stop: 2023-04-22

## 2023-04-17 MED ADMIN — pantoprazole (Protonix) EC tablet 40 mg: 40 mg | ORAL | @ 13:00:00

## 2023-04-17 MED ADMIN — spironolactone (ALDACTONE) tablet 100 mg: 100 mg | ORAL | @ 10:00:00

## 2023-04-17 MED ADMIN — metFORMIN (GLUCOPHAGE) tablet 1,000 mg: 1000 mg | ORAL | @ 13:00:00

## 2023-04-17 MED ADMIN — lactulose (CEPHULAC) packet 20 g: 20 g | ORAL | @ 01:00:00

## 2023-04-17 MED ADMIN — aspirin chewable tablet 81 mg: 81 mg | ORAL | @ 13:00:00

## 2023-04-17 MED ADMIN — insulin glargine (LANTUS) injection 66 Units: 66 [IU] | SUBCUTANEOUS

## 2023-04-17 MED ADMIN — spironolactone (ALDACTONE) tablet 100 mg: 100 mg | ORAL | @ 18:00:00

## 2023-04-17 MED ADMIN — HYDROcodone-acetaminophen (NORCO) 5-325 mg per tablet 1 tablet: 1 | ORAL | @ 05:00:00 | Stop: 2023-04-21

## 2023-04-17 MED ADMIN — pravastatin (PRAVACHOL) tablet 40 mg: 40 mg | ORAL | @ 01:00:00

## 2023-04-17 MED ADMIN — mirtazapine (REMERON) tablet 7.5 mg: 7.5 mg | ORAL | @ 01:00:00

## 2023-04-17 NOTE — Unmapped (Signed)
Pt Aox4. VSS. In no acute distress. Independent in room and bathroom. Right Arm in a sling. Took Medications whole with ginger ale and swallowed fine. Edema reduced in BLE extremity. Nor co given at MN. Pt currently sleeping. P.o lasix given for diuresing.Will continue to monitor    Problem: Adult Inpatient Plan of Care  Goal: Plan of Care Review  04/16/2023 2232 by Nani Gasser, RN  Outcome: Progressing  04/16/2023 2040 by Nani Gasser, RN  Outcome: Progressing  Goal: Patient-Specific Goal (Individualized)  04/16/2023 2232 by Nani Gasser, RN  Outcome: Progressing  04/16/2023 2040 by Nani Gasser, RN  Outcome: Progressing  Goal: Absence of Hospital-Acquired Illness or Injury  04/16/2023 2232 by Nani Gasser, RN  Outcome: Progressing  04/16/2023 2040 by Nani Gasser, RN  Outcome: Progressing  Intervention: Identify and Manage Fall Risk  Recent Flowsheet Documentation  Taken 04/16/2023 2200 by Nani Gasser, RN  Safety Interventions:   fall reduction program maintained   low bed   nonskid shoes/slippers when out of bed  Taken 04/16/2023 2035 by Nani Gasser, RN  Safety Interventions:   fall reduction program maintained   low bed   nonskid shoes/slippers when out of bed  Intervention: Prevent and Manage VTE (Venous Thromboembolism) Risk  Recent Flowsheet Documentation  Taken 04/16/2023 2200 by Nani Gasser, RN  Anti-Embolism Intervention: Other (Comment)  Taken 04/16/2023 2035 by Nani Gasser, RN  Anti-Embolism Intervention: Other (Comment)  Goal: Optimal Comfort and Wellbeing  04/16/2023 2232 by Nani Gasser, RN  Outcome: Progressing  04/16/2023 2040 by Nani Gasser, RN  Outcome: Progressing  Goal: Readiness for Transition of Care  04/16/2023 2232 by Nani Gasser, RN  Outcome: Progressing  04/16/2023 2040 by Nani Gasser, RN  Outcome: Progressing  Goal: Rounds/Family Conference  04/16/2023 2232 by Nani Gasser, RN  Outcome: Progressing  04/16/2023 2040 by Nani Gasser, RN  Outcome: Progressing     Problem: Fall Injury Risk  Goal: Absence of Fall and Fall-Related Injury  04/16/2023 2232 by Nani Gasser, RN  Outcome: Progressing  04/16/2023 2040 by Nani Gasser, RN  Outcome: Progressing  Intervention: Promote Injury-Free Environment  Recent Flowsheet Documentation  Taken 04/16/2023 2200 by Nani Gasser, RN  Safety Interventions:   fall reduction program maintained   low bed   nonskid shoes/slippers when out of bed  Taken 04/16/2023 2035 by Nani Gasser, RN  Safety Interventions:   fall reduction program maintained   low bed   nonskid shoes/slippers when out of bed     Problem: Fluid Volume Excess  Goal: Fluid Balance  04/16/2023 2232 by Nani Gasser, RN  Outcome: Progressing  04/16/2023 2040 by Nani Gasser, RN  Outcome: Progressing

## 2023-04-17 NOTE — Unmapped (Signed)
Speech Language Pathology Clinical Swallow Assessment  Evaluation (04/17/23 0901)    Patient Name:  Ashley Briggs       Medical Record Number: [id number]   Date of Birth: 01-May-1966  Sex: Female            SLP Treatment Diagnosis: dysphagia  Activity Tolerance: Patient tolerated treatment well    Assessment  Pt seen to assess swallow function secondary to dysphagia concerns. Pt is currently on a regular/thin liquid diet. Pt assessed with thin liquids, purees, and solids. Pt consumed all consistencies without signs/symptoms of aspiration. Oral mech indicated macroglossia and reduced superior lingual range of motion with no impact on oral phase. Pt reports having trouble swallowing saliva. During recent EGD, dilation was recommended but not completed due to esophageal varices. Pt has reported increased difficulty with swallowing over the past 1.5 weeks which may warrant GI reevaluation to manage symptoms. SLP provided education re: aspiration precautions (small bites/sips, eat slowly, sit upright, alternate liquids and solids). Further acute ST not warranted at this time, SLP signing off. Please reconsult if further needs arise.  Risk for Aspiration: Mild     Recommendations:  PO Diet           Diet Liquids Recommendations: No Restrictions    Diet Solids Recommendation: Regular Consistency Solids    Recommended Form of Medications: Whole, With liquid      Recommended Compensatory Techniques : Slow rate, Small sips/bites, Upright 90 degrees, Alternate liquids and solids    Post Acute Discharge Recommendations  Post Acute SLP Discharge Recommendations: Skilled SLP services are NOT indicated    Prognosis: Good  Positive Indicators: Performance on assessment; participation  Barriers to Discharge: None     Plan of Care  SLP Follow-up / Frequency: D/C Services, D/C Services  Planned Treatment Duration : 04/17/23    Treatment Goals:      N/A, eval only    Patient and Family Goal: To figure out what is going on with her swallow    Subjective  Medical Updates Since Last Visit: Pt a 57 y.o. female with a PMHx of DM, cirrhosis, HTN who presents with ascites. Pt reports a few years of swallowing difficulty with increase in symptoms over the past week and a half.                     Communication Preference: Verbal  Patient/Caregiver Reports: Pt reports she has trouble swallowing her saliva  Pain: no s/s of pain           Allergies: Patient has no known allergies.  Current Facility-Administered Medications   Medication Dose Route Frequency Provider Last Rate Last Admin    acetaminophen (TYLENOL) tablet 650 mg  650 mg Oral BID PRN Hemsey, Huntley Estelle, MD        aspirin chewable tablet 81 mg  81 mg Oral Daily Earna Coder, MD   81 mg at 04/17/23 8119    carvedilol (COREG) tablet 6.25 mg  6.25 mg Oral BID Earna Coder, MD   6.25 mg at 04/17/23 1478    cholestyramine (QUESTRAN) 4 gram packet 1 packet  1 packet Oral Daily Hemsey, Huntley Estelle, MD   1 packet at 04/16/23 1817    dextrose (D10W) 10% bolus 125 mL  12.5 g Intravenous Q10 Min PRN Kateri Plummer, MD        furosemide (LASIX) tablet 40 mg  40 mg Oral BID Hemsey, Huntley Estelle, MD   40 mg  at 04/17/23 0555    glucagon injection 1 mg  1 mg Intramuscular Once PRN Kateri Plummer, MD        glucose chewable tablet 16 g  16 g Oral Q10 Min PRN Kateri Plummer, MD        HYDROcodone-acetaminophen [facility]) 5-325 mg per tablet 1 tablet  1 tablet Oral Q6H PRN Margaretha Seeds, MD   1 tablet at 04/17/23 1610    insulin glargine (LANTUS) injection 66 Units  66 Units Subcutaneous Nightly Earna Coder, MD   66 Units at 04/16/23 2023    insulin lispro (HumaLOG) injection 10 Units  10 Units Subcutaneous TID [facility] Earna Coder, MD   10 Units at 04/16/23 1816    lactulose (CEPHULAC) packet 20 g  20 g Oral TID Earna Coder, MD   20 g at 04/17/23 9604    metFORMIN (GLUCOPHAGE) tablet 1,000 mg  1,000 mg Oral BID with meals Earna Coder, MD   1,000 mg at 04/17/23 5409    mirtazapine (REMERON) tablet 7.5 mg  7.5 mg Oral Nightly Earna Coder, MD   7.5 mg at 04/16/23 2031    pantoprazole (Protonix) EC tablet 40 mg  40 mg Oral BID Earna Coder, MD   40 mg at 04/17/23 8119    pravastatin (PRAVACHOL) tablet 40 mg  40 mg Oral Nightly Earna Coder, MD   40 mg at 04/16/23 2032    promethazine (PHENERGAN) tablet 12.5 mg  12.5 mg Oral Q8H PRN Earna Coder, MD        rifAXIMin [facility]) tablet 550 mg  550 mg Oral BID Earna Coder, MD   550 mg at 04/17/23 1478    spironolactone (ALDACTONE) tablet 100 mg  100 mg Oral BID Earna Coder, MD   100 mg at 04/17/23 2956     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 2 diabetes mellitus (CMS-HCC) 12/02/2022    Dry eye syndrome, bilateral 12/21/2020    Dysphagia 02/02/2023    Endometrial cancer (CMS-HCC) 2007    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    Liver disease     Major depression     Morbid obesity (CMS-HCC) 11/10/2019    Myopia of both eyes with astigmatism and  presbyopia 12/02/2022    Nausea 09/22/2022    Non-alcoholic fatty liver disease     NPDR (nonproliferative diabetic retinopathy) (CMS-HCC)     Right eye    Other ascites 09/22/2022    Other chronic pain 11/06/2022    Other insomnia 11/06/2022    PFD (pelvic floor dysfunction) 04/07/2013    Portal hypertension (CMS-HCC) 10/08/2022    Psoriasis     Ptosis of eyelid, left     Left upper eyelid    Reflux     Retinopathy of left eye, background, proliferative 01/09/2021    Right upper quadrant abdominal pain 09/22/2022    Secondary esophageal varices without bleeding (CMS-HCC) 06/17/2022    Type 2 diabetes mellitus, with long-term current use of insulin (CMS-HCC) 12/12/2015    Last Assessment & Plan:   The current medical regimen is effective;  continue present plan and medications.  Patient will continue good management of diabetes especially with weight loss. Hopefully with further weight loss will run into the problem of being overmedicated and having to cut back on medications     Some confusion on diabetes medications was have not.been refilled for over a year.  Pa     Family History   Problem Relation Age of Onset    Diabetes Mother     Cancer Mother     No Known Problems Father     No Known Problems Sister     Diabetes Maternal Grandmother     Hypertension Maternal Grandmother     Cancer Maternal Grandfather     Diabetes Maternal Grandfather     No Known Problems Paternal Grandmother     Skin cancer Paternal Grandfather     Stroke Paternal Grandfather     No Known Problems Brother     No Known Problems Maternal Aunt     No Known Problems Maternal Uncle     No Known Problems Paternal Aunt     No Known Problems Paternal Uncle     No Known Problems Other     Melanoma Neg Hx     Basal cell carcinoma Neg Hx     Squamous cell carcinoma Neg Hx     Substance Abuse Disorder Neg Hx     Alcohol abuse Neg Hx     Drug abuse Neg Hx     Mental illness Neg Hx     Amblyopia Neg Hx     Blindness Neg Hx     Cataracts Neg Hx     Glaucoma Neg Hx     Macular degeneration Neg Hx     Retinal detachment Neg Hx     Strabismus Neg Hx     Thyroid disease Neg Hx     Breast cancer Neg Hx      Past Surgical History:   Procedure Laterality Date    CHOLECYSTECTOMY      HYSTERECTOMY      endometrial cancer    HYSTERECTOMY      OOPHORECTOMY      PR ANAL PRESSURE RECORD Left 03/31/2013    Procedure: ANORECTAL MANOMETRY;  Surgeon: None None;  [facility];  Service: Gastroenterology    PR BREATH HYDROGEN TEST N/A 03/31/2013    Procedure: BREATH HYDROGEN TEST;  Surgeon: None None;  Location: [facility];  Service: Gastroenterology    PR COLSC FLX W/RMVL OF TUMOR POLYP LESION SNARE TQ N/A 09/06/2020    Procedure: COLONOSCOPY FLEX; W/REMOV TUMOR/LES BY SNARE;  Surgeon: Reather Converse  Council Mechanic, MD;  Location: [facility];  Service: Gastroenterology    PR UPPER GI ENDOSCOPY,BIOPSY N/A 04/15/2013    Procedure: UGI ENDOSCOPY; WITH BIOPSY, SINGLE OR MULTIPLE;  Surgeon: Vickii Chafe, MD;  Location: [facility];  Service: Gastroenterology    PR UPPER GI ENDOSCOPY,BIOPSY N/A 09/06/2020    Procedure: UGI ENDOSCOPY; WITH BIOPSY, SINGLE OR MULTIPLE;  Surgeon: Chriss Driver, MD;  [facility]: [facility];  Service: Gastroenterology    PR UPPER GI ENDOSCOPY,BIOPSY N/A 05/05/2022    Procedure: UGI ENDOSCOPY; WITH BIOPSY, SINGLE OR MULTIPLE;  Surgeon: Chriss Driver, MD;  Location: [facility];  Service: Gastroenterology    PR UPPER GI ENDOSCOPY,BIOPSY N/A 01/22/2023    Procedure: UGI ENDOSCOPY; WITH BIOPSY, SINGLE OR MULTIPLE;  Surgeon: Vonda Antigua, MD;  Location: [facility];  Service: Gastroenterology     Social History     Tobacco Use    Smoking status: Never     Passive exposure: Current    Smokeless tobacco: Never   Substance Use Topics    Alcohol use: Not Currently     Comment: No longer         General:                    Self-Feeding Capacity: Functional for self-feeding                   Medical Tests / Procedures Comments: CXR (5/15): FINDINGS:    Lungs are clear.  No pleural effusion or pneumothorax.    Normal heart size and mediastinal contours.          Precautions / Restrictions  Precautions: Aspiration precautions, Falls precautions  Weight Bearing Status: Non-applicable  Required Braces or Orthoses: Non-applicable    Objective  Temperature Spikes Noted: N/A  Respiratory Status : Room air  History of Intubation: No          Behavior/Cognition: Alert, Cooperative, Pleasant mood  Positioning : Upright in bed    Oral / Motor Exam  Vocal Quality: Normal  Volitional Swallow: Within Functional Limits   Labial ROM: Within Functional Limits   Labial Symmetry: Within Functional Limits  Labial Strength: Within Functional Limits   Lingual ROM: Within Functional Limits  Lingual Symmetry: Within Functional Limits  Lingual Strength: Within Functional Limits   Lingual Sensation: Within Functional Limits  Velum: elevation grossly WNL   Mandible: Within Functional Limits  Coordination: WFL  Facial ROM: Within Functional Limits   Facial Symmetry: Within Functional Limits  Facial Strength: Within Functional Limits  Facial Sensation: Within Functional Limits   Vocal Intensity: Within Functional Limits   Gag: Unable to assess   Apraxia: None present   Dysarthria: None present   Intelligibility: Intelligible   Breath Support: Adequate for speech   Dentition: Adequate    Consistencies assessed: thin liquids, purees, and solids    Patient at end of session: All needs in reach, In bed, Nurse notified         Speech Therapy Session Duration  SLP Individual [mins]: 14    I attest that I have reviewed the above information.  Signed: Gabriel Rung, CCC-SLP    Filed 04/17/2023    The care for this patient was completed by Gabriel Rung, CCC-SLP:  A student was present and participated in the care. Licensed/Credentialed therapist was physically present and immediately available to direct and supervise tasks that were related to patient management. The direction  and supervision was continuous throughout the time these tasks were performed.    Woodie Trusty, CCC-SLP

## 2023-04-17 NOTE — Unmapped (Signed)
Hospital Medicine Daily Progress Note    Assessment/Plan:    Principal Problem:    Other ascites  Active Problems:    Hypertension    Diabetes mellitus (CMS-HCC)    Hepatic cirrhosis (CMS-HCC)    Hepatic encephalopathy (CMS-HCC)    Portal hypertension (CMS-HCC)  Resolved Problems:    * No resolved hospital problems. *                 Ashley Briggs is a 57 y.o. female who is presenting to [facility] with abdominal discomfort and ascites, in the setting of the following pertinent/contributing co-morbidities: DM, cirrhosis, HTN .     Ascites/Hepatic cirrhosis with history of HE, ascites, varices:  Presents primarily with weight gain and evidence of worsening ascites and LE edema. Reports compliance with medications and salt intake but recent increase in fluid intake related to dry mouth.  She did not realize this can worsen her fluid status/ascites.  Suspect this is a significant contributor.  No other obvious source, LFTs stable from previous, no pain, fever to suggest other complications. No evidence of GI bleeding, hepatic encephalopathy. Liver doppler negative for clot.   - Follow I/Os, weights  - Continue increased diuresis with furosemide 40 mg bid and spironolactone 100 mg bid  - Monitor kidney function  - Continue lactulose and rifaximin at current dose  - Continue carvedilol    Positive peritoneal fluid culture  Patient's peritoneal fluid positive for both Staph epi and Staph aureus in broth. Cell count low at 167 with no significant neutrophilia documented. No abdominal pain, fever, leukocytosis, or clinical worsening off of antibiotics. Suspect may be a contaminant.  - Ordered E-consult to ID  - Hold off on antibiotics  - Will consider paracentesis if worsens    Dysphagia  Patient reports increasing frequency of choking on her food intake and secretions, reports this has been ongoing but slightly increased since her EGD in 01/2023. 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 Path results as follows: Fragments of esophageal squamous epithelium with increased intraepithelial lymphocytic infiltrate, nonspecific; cannot exclude lichenoid esophagitis in the appropriate clinical setting. That said, reports symptoms in both upper throat and chest.   - Speech consulted  - May warrant repeat endoscopy as an outpatient     Secondary/Additional Active Problems:  Hypertension:  Normal pressures on arrival.  On carvedilol (also for varices/portal HTN).  -Continue carvedilol at usual dose     Diabetes mellitus (CMS-HCC):  Last HbA1c 8.4% in March.  Now on glargine insulin with short acting with meals as well SGLT2 inhibitor, GLP1 agonist and metformin. Follows with [facility] and has upcoming appointment with her outpatient provider in June. Patient reports continue suboptimal control, likely needs further titration of her insulin as she is on appropriate adjunctive medication.  -Continue glargine insulin with lispro mealtime doses as per usual regimen  -Continue metformin  -Hold SGLT2 and GLP1 agents as non formulary but can restart on discharge  -Unless marked elevations, likely defer changes to outpatient as will not be on usual regimen here    Microcytic anemia  No overt bleeding but at risk for bleeding with EV and PHG (noted on EGD 01/2023).  - Ferritin 107, iron sat 6%  - Start PO iron at discharge    Hypomagnesemia  - Aggressive IV and PO repletion today, recheck again tomorrow     Prophylaxis  -ambulation     Diet  -Low salt, fluid restriction     Code Status /  HCDM  -Full Code, Discussed with patient at the time of admission   -HCDM (patient stated preference): Caudle,Ryan - Relative - [phone]  ___________________________________________________________________    Subjective:  Feels tired and intermittently cold but no rigors and otherwise eating/drinking ok. Denies abdominal pain. No N/V. Wt stable today at 217 lb.    Recent Results (from the past 24 hour(s))   POCT Glucose    Collection Time: 04/16/23  4:57 PM   Result Value Ref Range    Glucose, POC 101 70 - 179 mg/dL   POCT Glucose    Collection Time: 04/16/23  8:24 PM   Result Value Ref Range    Glucose, POC 108 70 - 179 mg/dL   POCT Glucose    Collection Time: 04/17/23  8:31 AM   Result Value Ref Range    Glucose, POC 129 70 - 179 mg/dL   CBC    Collection Time: 04/17/23 10:44 AM   Result Value Ref Range    WBC 3.0 (L) 3.6 - 11.2 10*9/L    RBC 4.21 3.95 - 5.13 10*12/L    HGB 9.9 (L) 11.3 - 14.9 g/dL    HCT 09.8 (L) 11.9 - 44.0 %    MCV 73.3 (L) 77.6 - 95.7 fL    MCH 23.5 (L) 25.9 - 32.4 pg    MCHC 32.0 32.0 - 36.0 g/dL    RDW 14.7 (H) 82.9 - 15.2 %    MPV 7.4 6.8 - 10.7 fL    Platelet 203 150 - 450 10*9/L   Comprehensive Metabolic Panel    Collection Time: 04/17/23 10:44 AM   Result Value Ref Range    Sodium 138 135 - 145 mmol/L    Potassium 3.9 3.5 - 5.1 mmol/L    Chloride 103 98 - 107 mmol/L    CO2 25.3 20.0 - 31.0 mmol/L    Anion Gap 10 5 - 14 mmol/L    BUN 8 (L) 9 - 23 mg/dL    Creatinine 5.62 1.30 - 1.02 mg/dL    BUN/Creatinine Ratio 15     eGFR CKD-EPI (2021) Female >90 >=60 mL/min/1.26m2    Glucose 142 70 - 179 mg/dL    Calcium 8.7 8.7 - 86.5 mg/dL    Albumin 1.9 (L) 3.4 - 5.0 g/dL    Total Protein 6.7 5.7 - 8.2 g/dL    Total Bilirubin 2.4 (H) 0.3 - 1.2 mg/dL    AST 46 (H) <=78 U/L    ALT 15 10 - 49 U/L    Alkaline Phosphatase 340 (H) 46 - 116 U/L   Magnesium Level    Collection Time: 04/17/23 10:44 AM   Result Value Ref Range    Magnesium 1.3 (L) 1.6 - 2.6 mg/dL   Ferritin    Collection Time: 04/17/23 10:44 AM   Result Value Ref Range    Ferritin 107.8 7.3 - 270.7 ng/mL   Iron Panel    Collection Time: 04/17/23 10:44 AM   Result Value Ref Range    Iron 17 (L) 50 - 170 ug/dL    TIBC 469.6 295.2 - 841.3 ug/dL    Transferrin 244.0 (L) 250.0 - 380.0 mg/dL    Iron Saturation (%) 6 %   POCT Glucose    Collection Time: 04/17/23 11:54 AM   Result Value Ref Range    Glucose, POC 144 70 - 179 mg/dL     Labs/Studies:  Labs and Studies from the last 24hrs per EMR and Reviewed    Objective:  Temp:  [35.7 ??C (96.3 ??F)-36.8 ??C (98.2 ??F)] 35.7 ??C (96.3 ??F)  Heart Rate:  [73-82] 73  Resp:  [18] 18  BP: (101-132)/(52-60) 101/52  SpO2:  [93 %-98 %] 98 %    General: No acute distress.  Appears stated age.    HEENT: MMM, oropharynx clear  Cardiac: RRR, no M/R/G  Pulmonary: Normal work of breathing, clear bilaterally, no wheezes or crackles  Abdomen: Soft, mild distention but may be due to body habitus. Non-tender. LVP site clean and dry.    Skin: No jaundice. No rashes or lesions.  Extremities: Trace to 1+ edema in bLE, cyanosis, or clubbing. Right shoulder in sling.   Neuro: CN 2-12 intact, no gross motor deficits. No tremor or asterixis.   Psych: Oriented to self, place, and time. Normal speech and affect.

## 2023-04-17 NOTE — Unmapped (Signed)
No complaints of pain this shift. Pt taking medications as ordered. Patient up to chair for part of shift. IV mag given per order. Plan for patient to discharge home tomorrow.   Problem: Adult Inpatient Plan of Care  Goal: Plan of Care Review  Outcome: Progressing     Problem: Fall Injury Risk  Goal: Absence of Fall and Fall-Related Injury  Outcome: Progressing  Intervention: Promote Scientist, clinical (histocompatibility and immunogenetics) Documentation  Taken 04/17/2023 1000 by Ane Payment, RN  Safety Interventions: fall reduction program maintained  Taken 04/17/2023 0800 by Ane Payment, RN  Safety Interventions: fall reduction program maintained     Problem: Fluid Volume Excess  Goal: Fluid Balance  Outcome: Progressing

## 2023-04-18 LAB — COMPREHENSIVE METABOLIC PANEL
ALBUMIN: 2.1 g/dL — ABNORMAL LOW (ref 3.4–5.0)
ALKALINE PHOSPHATASE: 335 U/L — ABNORMAL HIGH (ref 46–116)
ALT (SGPT): 12 U/L (ref 10–49)
ANION GAP: 11 mmol/L (ref 5–14)
AST (SGOT): 47 U/L — ABNORMAL HIGH (ref <=34)
BILIRUBIN TOTAL: 2.1 mg/dL — ABNORMAL HIGH (ref 0.3–1.2)
BLOOD UREA NITROGEN: 7 mg/dL — ABNORMAL LOW (ref 9–23)
BUN / CREAT RATIO: 13
CALCIUM: 8.7 mg/dL (ref 8.7–10.4)
CHLORIDE: 103 mmol/L (ref 98–107)
CO2: 22.7 mmol/L (ref 20.0–31.0)
CREATININE: 0.54 mg/dL — ABNORMAL LOW
EGFR CKD-EPI (2021) FEMALE: >90 mL/min/{1.73_m2} (ref >=60)
GLUCOSE RANDOM: 84 mg/dL (ref 70–179)
POTASSIUM: 3.6 mmol/L (ref 3.4–4.8)
PROTEIN TOTAL: 7.8 g/dL (ref 5.7–8.2)
SODIUM: 137 mmol/L (ref 135–145)

## 2023-04-18 LAB — MAGNESIUM
MAGNESIUM: 1.5 mg/dL — ABNORMAL LOW (ref 1.6–2.6)
MAGNESIUM: 4.6 mg/dL — ABNORMAL HIGH (ref 1.6–2.6)
MAGNESIUM: 1.8 mg/dL (ref 1.6–2.6)

## 2023-04-18 LAB — CBC
HEMATOCRIT: 30.9 % — ABNORMAL LOW (ref 34.0–44.0)
HEMOGLOBIN: 9.9 g/dL — ABNORMAL LOW (ref 11.3–14.9)
MEAN CORPUSCULAR HEMOGLOBIN CONC: 31.9 g/dL — ABNORMAL LOW (ref 32.0–36.0)
MEAN CORPUSCULAR HEMOGLOBIN: 23.6 pg — ABNORMAL LOW (ref 25.9–32.4)
MEAN CORPUSCULAR VOLUME: 73.9 fL — ABNORMAL LOW (ref 77.6–95.7)
MEAN PLATELET VOLUME: 7.6 fL (ref 6.8–10.7)
PLATELET COUNT: 205 10*9/L (ref 150–450)
RED BLOOD CELL COUNT: 4.18 10*12/L (ref 3.95–5.13)
RED CELL DISTRIBUTION WIDTH: 20.9 % — ABNORMAL HIGH (ref 12.2–15.2)
WBC ADJUSTED: 3.7 10*9/L (ref 3.6–11.2)

## 2023-04-18 MED ADMIN — spironolactone (ALDACTONE) tablet 100 mg: 100 mg | ORAL | @ 10:00:00

## 2023-04-18 MED ADMIN — pantoprazole (Protonix) EC tablet 40 mg: 40 mg | ORAL | @ 13:00:00

## 2023-04-18 MED ADMIN — metFORMIN (GLUCOPHAGE) tablet 1,000 mg: 1000 mg | ORAL | @ 22:00:00

## 2023-04-18 MED ADMIN — pantoprazole (Protonix) EC tablet 40 mg: 40 mg | ORAL | @ 01:00:00

## 2023-04-18 MED ADMIN — acetaminophen (TYLENOL) tablet 650 mg: 650 mg | ORAL

## 2023-04-18 MED ADMIN — cholestyramine (QUESTRAN) 4 gram packet 1 packet: 1 | ORAL | @ 22:00:00

## 2023-04-18 MED ADMIN — magnesium sulfate 2gm/50mL IVPB: 2 g | INTRAVENOUS | @ 15:00:00 | Stop: 2023-04-18

## 2023-04-18 MED ADMIN — furosemide (LASIX) tablet 40 mg: 40 mg | ORAL | @ 10:00:00

## 2023-04-18 MED ADMIN — lactulose (CEPHULAC) packet 20 g: 20 g | ORAL | @ 01:00:00

## 2023-04-18 MED ADMIN — insulin glargine (LANTUS) injection 66 Units: 66 [IU] | SUBCUTANEOUS | @ 01:00:00

## 2023-04-18 MED ADMIN — mirtazapine (REMERON) tablet 7.5 mg: 7.5 mg | ORAL | @ 01:00:00

## 2023-04-18 MED ADMIN — pravastatin (PRAVACHOL) tablet 40 mg: 40 mg | ORAL | @ 01:00:00

## 2023-04-18 MED ADMIN — rifAXIMin (XIFAXAN) tablet 550 mg: 550 mg | ORAL | @ 13:00:00 | Stop: 2023-04-22

## 2023-04-18 MED ADMIN — magnesium sulfate 2gm/50mL IVPB: 2 g | INTRAVENOUS | @ 13:00:00 | Stop: 2023-04-18

## 2023-04-18 MED ADMIN — insulin lispro (HumaLOG) injection 10 Units: 10 [IU] | SUBCUTANEOUS | @ 22:00:00

## 2023-04-18 MED ADMIN — HYDROcodone-acetaminophen (NORCO) 5-325 mg per tablet 1 tablet: 1 | ORAL | @ 22:00:00 | Stop: 2023-04-21

## 2023-04-18 MED ADMIN — aspirin chewable tablet 81 mg: 81 mg | ORAL | @ 13:00:00

## 2023-04-18 MED ADMIN — insulin lispro (HumaLOG) injection 10 Units: 10 [IU] | SUBCUTANEOUS | @ 17:00:00

## 2023-04-18 MED ADMIN — magnesium oxide (MAG-OX) tablet 400 mg: 400 mg | ORAL | @ 01:00:00

## 2023-04-18 MED ADMIN — carvedilol (COREG) tablet 6.25 mg: 6.25 mg | ORAL | @ 13:00:00

## 2023-04-18 NOTE — Unmapped (Signed)
Pt got admitted for ascites. Alert and oriented x 4. On room air. Skin and fall precautions maintained. Blood sugar monitored. Insulin coverage and Lantus administered as ordered. Denies pain. Verbalized plan of care. Call bell within reach. Working towards prioritized goals.     Problem: Adult Inpatient Plan of Care  Goal: Plan of Care Review  Outcome: Progressing     Problem: Fall Injury Risk  Goal: Absence of Fall and Fall-Related Injury  Outcome: Progressing  Intervention: Promote Injury-Free Environment  Recent Flowsheet Documentation  Taken 04/17/2023 2000 by Lyndal Pulley, RN  Safety Interventions: fall reduction program maintained     Problem: Fluid Volume Excess  Goal: Fluid Balance  Outcome: Progressing

## 2023-04-18 NOTE — Unmapped (Addendum)
Hospital Medicine Daily Progress Note    Assessment/Plan:    Principal Problem:    Other ascites  Active Problems:    Hypertension    Diabetes mellitus (CMS-HCC)    Hepatic cirrhosis (CMS-HCC)    Hepatic encephalopathy (CMS-HCC)    Portal hypertension (CMS-HCC)  Resolved Problems:    * No resolved hospital problems. *                 Ashley Briggs is a 57 y.o. female who is presenting to [facility] with abdominal discomfort and ascites, in the setting of the following pertinent/contributing co-morbidities: DM, cirrhosis, HTN .     Ascites/Hepatic cirrhosis with history of HE, ascites, varices:  Presents primarily with weight gain and evidence of worsening ascites and LE edema. Reports compliance with medications and salt intake but recent increase in fluid intake related to dry mouth.  She did not realize this can worsen her fluid status/ascites.  Suspect this is a significant contributor.  No other obvious source, LFTs stable from previous, no pain, fever to suggest other complications. No evidence of GI bleeding, hepatic encephalopathy. Liver doppler negative for clot.   - Follow I/Os, weights  - Continue increased diuresis with furosemide 40 mg bid and spironolactone 100 mg bid  - Monitor kidney function  - Continue rifaximin at current dose  - Holding lactulose in setting of numerous episodes of watery diarrhea  - Continue carvedilol    Diarrhea - Abd pain  Patient with 4 episodes of watery diarrhea overnight, 3 episodes this morning. Likely in setting of lactulose and oral magnesium yesterday; holding these today and monitoring. Dr. Steve Rattler performed US, no available pocket to tap and no hematoma or other findings of peritonitis.     Positive peritoneal fluid culture  Patient's peritoneal fluid positive for both Staph epi and Staph aureus in broth. Cell count low at 167 with no significant neutrophilia documented. No abdominal pain, fever, leukocytosis, or clinical worsening off of antibiotics. Suspect may be a contaminant.  - Ordered E-consult to ID, they are in agreement that is likely a contaminant since two bacteria growing from broth only and both skin bugs.   - Consulted my colleague Dr. Steve Rattler to see if pocket for diagnostic paracentesis was seen but not enough ascites to perform repeat tap.   - Hold off on antibiotics    Dysphagia  Patient reports increasing frequency of choking on her food intake and secretions, reports this has been ongoing but slightly increased since her EGD in 01/2023. 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 Path results as follows: Fragments of esophageal squamous epithelium with increased intraepithelial lymphocytic infiltrate, nonspecific; cannot exclude lichenoid esophagitis in the appropriate clinical setting. That said, reports symptoms in both upper throat and chest.   - Speech consulted  - May warrant repeat endoscopy as an outpatient     Secondary/Additional Active Problems:  Hypertension:  Normal pressures on arrival.  On carvedilol (also for varices/portal HTN).  -Continue carvedilol at usual dose     Diabetes mellitus (CMS-HCC):  Last HbA1c 8.4% in March.  Now on glargine insulin with short acting with meals as well SGLT2 inhibitor, GLP1 agonist and metformin. Follows with [facility] and has upcoming appointment with her outpatient provider in June. Patient reports continue suboptimal control, likely needs further titration of her insulin as she is on appropriate adjunctive medication.  -Continue glargine insulin with lispro mealtime doses as per usual regimen  -Continue metformin  -Hold SGLT2 and GLP1  agents as non formulary but can restart on discharge  -Unless marked elevations, likely defer changes to outpatient as will not be on usual regimen here    Microcytic anemia  No overt bleeding but at risk for bleeding with EV and PHG (noted on EGD 01/2023).  - Ferritin 107, iron sat 6%  - Start PO iron at discharge    Hypomagnesemia  - Aggressive IV repletion today Prophylaxis  -ambulation     Diet  -Low salt, fluid restriction     Code Status / HCDM  -Full Code, Discussed with patient at the time of admission   -HCDM (patient stated preference): Caudle,Ryan - Relative - [phone]    I personally spent 50 minutes face-to-face and non-face-to-face in the care of this patient, which includes all pre, intra, and post visit time on the date of service.  All documented time was specific to the E/M visit and does not include any procedures that may have been performed.      ___________________________________________________________________    Subjective:  Feels tired. Had some increased abdominal pain overnight in setting of diarrhea. Improved today after passing gas but still some discomfort. Did not want to eat breakfast but did tolerate lunch this afternoon.     Recent Results (from the past 24 hour(s))   POCT Glucose    Collection Time: 04/17/23  5:22 PM   Result Value Ref Range    Glucose, POC 102 70 - 179 mg/dL   POCT Glucose    Collection Time: 04/17/23  8:31 PM   Result Value Ref Range    Glucose, POC 118 70 - 179 mg/dL   POCT Glucose    Collection Time: 04/17/23  9:14 PM   Result Value Ref Range    Glucose, POC 140 70 - 179 mg/dL   CBC    Collection Time: 04/18/23  5:15 AM   Result Value Ref Range    WBC 3.7 3.6 - 11.2 10*9/L    RBC 4.18 3.95 - 5.13 10*12/L    HGB 9.9 (L) 11.3 - 14.9 g/dL    HCT 08.6 (L) 57.8 - 44.0 %    MCV 73.9 (L) 77.6 - 95.7 fL    MCH 23.6 (L) 25.9 - 32.4 pg    MCHC 31.9 (L) 32.0 - 36.0 g/dL    RDW 46.9 (H) 62.9 - 15.2 %    MPV 7.6 6.8 - 10.7 fL    Platelet 205 150 - 450 10*9/L   Comprehensive Metabolic Panel    Collection Time: 04/18/23  5:15 AM   Result Value Ref Range    Sodium 137 135 - 145 mmol/L    Potassium 3.6 3.4 - 4.8 mmol/L    Chloride 103 98 - 107 mmol/L    CO2 22.7 20.0 - 31.0 mmol/L    Anion Gap 11 5 - 14 mmol/L    BUN 7 (L) 9 - 23 mg/dL    Creatinine 5.28 (L) 0.55 - 1.02 mg/dL    BUN/Creatinine Ratio 13     eGFR CKD-EPI (2021) Female >90 >=60 mL/min/1.33m2    Glucose 84 70 - 179 mg/dL    Calcium 8.7 8.7 - 41.3 mg/dL    Albumin 2.1 (L) 3.4 - 5.0 g/dL    Total Protein 7.8 5.7 - 8.2 g/dL    Total Bilirubin 2.1 (H) 0.3 - 1.2 mg/dL    AST 47 (H) <=24 U/L    ALT 12 10 - 49 U/L    Alkaline Phosphatase 335 (H) 46 -  116 U/L   Magnesium Level    Collection Time: 04/18/23  5:15 AM   Result Value Ref Range    Magnesium 1.5 (L) 1.6 - 2.6 mg/dL   POCT Glucose    Collection Time: 04/18/23 11:27 AM   Result Value Ref Range    Glucose, POC 95 70 - 179 mg/dL   Magnesium Level    Collection Time: 04/18/23  3:34 PM   Result Value Ref Range    Magnesium 4.6 (H) 1.6 - 2.6 mg/dL     Labs/Studies:  Labs and Studies from the last 24hrs per EMR and Reviewed    Objective:  Temp:  [35.7 ??C (96.3 ??F)-36.5 ??C (97.7 ??F)] 36.5 ??C (97.7 ??F)  Heart Rate:  [80-83] 80  Resp:  [18-20] 20  BP: (106-121)/(53-59) 111/53  SpO2:  [95 %-98 %] 98 %    General: No acute distress.  Appears stated age.    HEENT: MMM, oropharynx clear  Cardiac: RRR, no M/R/G  Pulmonary: Normal work of breathing, clear bilaterally, no wheezes or crackles  Abdomen: Soft, mild distention but may be due to body habitus. Tender at LVP site. LVP site clean and dry with only slight bruising underneath.    Skin: No jaundice. No rashes or lesions.  Extremities: Trace to 1+ edema in bLE, cyanosis, or clubbing. Right shoulder in sling.   Neuro: CN 2-12 intact, no gross motor deficits. No tremor or asterixis.   Psych: Oriented to self, place, and time. Normal speech and affect.

## 2023-04-18 NOTE — Unmapped (Signed)
Medicine Procedure Service (MDM) Consultation    Principal Problem:    Other ascites  Active Problems:    Hypertension    Diabetes mellitus (CMS-HCC)    Hepatic cirrhosis (CMS-HCC)    Hepatic encephalopathy (CMS-HCC)    Portal hypertension (CMS-HCC)      Ashley Briggs is a 57 y.o. y/o female that presents to [facility] with Other ascites.    Patient seen in for consideration of diagnostic paracentesis per request of the primary team. Patient's main reported symptom is diffuse abdominal pain. We performed a bedside ultrasound and images are reproduced below.  We are unable to perform the procedure at this time due to insufficient fluid pocket. LVP was performed on 5/15 with 4.2L removed. With turning the patient, able to visualize a small amount of ascites but not enough for paracentesis. She has dynamic bowel that comes to the peritoneal wall. She also does have visible bowel gas, most notably on the L. Bandage removed from paracentesis site. Does have some surrounding bruising, but no hematoma. Small amount of bleeding noted with removal of the bandage, so new dressing applied. H/H stable, no concern for clinically significant bleeding s/p LVP.     LLQ      RLQ      Thank you for this consultation.  We will sign off.

## 2023-04-18 NOTE — Unmapped (Signed)
[facility]FECTI[facility] DISEASES INPATIENT E-CONSULT      Ashley Briggs is being seen in consultation at the request of Kateri Plummer, MD for evaluation and management of positive peritoneal cultures.       PLAN FOR 04/17/2023    Diagnostic  Follow-up final culture results    Treatment  NA    Please note that ID is not able to formally follow patients by e-consult, and an e-consult should not be considered an equivalent to a face-to-face evaluation. If the primary team is concerned that the patient could benefit from medical services not available at [facility], arrangements should be made for transfer to [facility].               Assessment:  57 year old woman with no clinical suspicion of abdominal infection or bacterial peritonitis who had ascitic cell count not suggestive of bacterial peritonitis, but who had ascites culture grow 2 different organisms from the broth, staph epidermis and Staph aureus.  While one should be very cautious in attributing a positive Staph aureus culture from sterile space to contamination, the story does sound fairly suspicious for contamination.  Is important to remember that the elevated cell count seen in bacterial peritonitis can be falsely low early in the disease process.  I think it would make sense to continue to observe the patient clinically off antibiotics.        I spent 21-25 minutes in medical consultative review of medical records, including a written report to the treating provider via electronic health record regarding the condition of this patient. I spent less than 50% of the service in medical consultative verbal or Internet (i.e., Epic chat) discussion.     If you have questions or concerns about this plan, please feel free to contact me through Epic by staff message or chat.    Christella Hartigan, MD  Division of Infectious Diseases    The recommendations provided in this eConsult are based on the clinical data available to me and are furnished without the benefit of a comprehensive in-person evaluation of the patient. Any new clinical issues or changes in patient status not available to me will need to be taken into account when assessing these recommendations. The ongoing management of this patient is the responsibility of the referring clinician. Please contact me if you have further questions.  _____________________________________________________________________________          Initial Consult Documentation from Apr 17, 2023     You asked:  Could this be contamination?        Sources of information include: chart review.      To summarize:  57 year old woman with known cirrhosis who was admitted with worsening ascites, weight gain, no fevers, no abdominal tenderness.  Patient underwent large-volume paracentesis on 5/15.  Apparently per routine it was sent for culture as well as cell count.  The cell count did not suggest peritonitis.  The culture grew 2 different organisms from the broth, Staph epidermidis and Staph aureus.  The patient has received no antibiotics.          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 2 diabetes mellitus (CMS-HCC) (12/02/2022), Dry eye syndrome, bilateral (12/21/2020), Dysphagia (02/02/2023), Endometrial cancer (CMS-HCC) (2007), Esophageal varices in cirrhosis (CMS-HCC) (06/17/2022), Gastroparesis, Generalized pruritus (02/02/2023), H/O diabetic gastroparesis (01/09/2021), Heart disease, Hemorrhage of rectum and anus (03/17/2011), Hepatic cirrhosis (CMS-HCC) (06/17/2022), Hepatic encephalopathy (CMS-HCC) (06/17/2022), Hepatic steatosis (03/13/2021), High cholesterol, psoriasis (08/06/2022), Hyperlipidemia (12/12/2015), Hypertension, 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), Liver disease, Major depression, Morbid obesity (CMS-HCC) (11/10/2019), Myopia of both eyes with astigmatism and presbyopia (12/02/2022), Nausea (09/22/2022), Non-alcoholic fatty liver disease, NPDR (nonproliferative diabetic retinopathy) (CMS-HCC), Other ascites (09/22/2022), Other chronic pain (11/06/2022), Other insomnia (11/06/2022), PFD (pelvic floor dysfunction) (04/07/2013), Portal hypertension (CMS-HCC) (10/08/2022), Psoriasis, Ptosis of eyelid, left, Reflux, Retinopathy of left eye, background, proliferative (01/09/2021), Right upper quadrant abdominal pain (09/22/2022), Secondary esophageal varices without bleeding (CMS-HCC) (06/17/2022), and Type 2 diabetes mellitus, with long-term current use of insulin (CMS-HCC) (12/12/2015).      Meds and Allergies  Patient has a current medication list which includes the following prescription(s): acetaminophen, aspirin, blood sugar diagnostic, blood-glucose meter, bupropion, canagliflozin, carvedilol, chlorhexidine, cholestyramine-aspartame, clobetasol, trulicity, slow fe, furosemide, hydrocodone-acetaminophen, insulin glargine, insulin lispro, lactulose, lancets, metformin, mirtazapine, pantoprazole, pen needle, diabetic, promethazine, rifaximin, cosentyx pen, cosentyx pen (2 pens), simvastatin, spironolactone, triamcinolone, and zolpidem, and the following Facility-Administered Medications: acetaminophen, aspirin, carvedilol, cholestyramine, dextrose, furosemide, glucagon, glucose, hydrocodone-acetaminophen, insulin glargine, insulin lispro, lactulose, magnesium oxide, [EXPIRED] magnesium sulfate **FOLLOWED BY** magnesium sulfate, metformin, mirtazapine, pantoprazole, pravastatin, promethazine, rifaximin, and spironolactone.    Allergies: Patient has no known allergies.           Social History  Patient  reports that she has never smoked. She has been exposed to tobacco smoke. She has never used smokeless tobacco. She reports that she does not currently use alcohol. She reports that she does not currently use drugs.                 Antimicrobials & Other Medications     Previous  None    Current  Rifaximin          Current Medications as of 04/17/2023  Scheduled  PRN   aspirin, 81 mg, Daily  carvedilol, 6.25 mg, BID  cholestyramine, 1 packet, Daily  furosemide, 40 mg, BID  insulin glargine, 66 Units, Nightly  insulin lispro, 10 Units, TID AC  lactulose, 20 g, TID  magnesium oxide, 400 mg, BID  magnesium sulfate, 2 g, Once  metFORMIN, 1,000 mg, BID with meals  mirtazapine, 7.5 mg, Nightly  pantoprazole, 40 mg, BID  pravastatin, 40 mg, Nightly  rifAXIMin, 550 mg, BID  spironolactone, 100 mg, BID      acetaminophen, 650 mg, BID PRN  dextrose in water, 12.5 g, Q10 Min PRN  glucagon, 1 mg, Once PRN  glucose, 16 g, Q10 Min PRN  HYDROcodone-acetaminophen, 1 tablet, Q6H PRN  promethazine, 12.5 mg, Q8H PRN             Physical Exam  Temp:  [35.7 ??C (96.3 ??F)-36.8 ??C (98.2 ??F)] 35.7 ??C (96.3 ??F)  Heart Rate:  [73-82] 73  Resp:  [18] 18  BP: (101-132)/(52-60) 101/52  MAP (mmHg):  [66-77] 66  SpO2:  [93 %-98 %] 98 %  BMI (Calculated):  [37.23] 37.23    Actual body weight: 98.4 kg (217 lb)   Ideal body weight: 54.7 kg (120 lb 9.5 oz)  Adjusted ideal body weight: 72.2 kg (159 lb 2.5 oz)     Patient Lines/Drains/Airways Status       Active Active  Lines, Drains, & Airways       Name Placement date Placement time Site Days    Peripheral IV 04/15/23 Left Antecubital 04/15/23  0954  Antecubital  2                      Data for Medical Decision Making  ( [id number] )      Latest Reference Range & Units 04/15/23 16:05   Albumin, Fluid Undefined g/dL <1.6   Albumin, Fluid Type  Fluid, Peritoneal   Fluid Type  Fluid, Peritoneal Color, Fluid  Yellow   Appearance, Fluid  Hazy   RBC, Fluid ul 448   Nucleated Cells, Fluid ul 167   Lymphocytes %, Fluid % 58.0   Mono/Macro % , Fluid % 27.0   Other Cells %, Fluid % 15.0   #Cells Counted BF Diff  100       Microbiological and Serological Data   ( 00CXNEW10  /  00CXSRC  /  00CXRES  /  00CXSUSC  /  RSLTMICRO )  5/15 peritoneal culture:  eritoneum; Fluid, Peritoneal   0 Result Notes  Ascitic/Peritoneal Culture Staphylococcus aureus Abnormal       Staphylococcus epidermidis Abnormal    This organism is a coagulase-negative Staphylococcus species.      Specimen Source: Peritoneum            Gram Stain Result  Panic   Growth from Broth Gram positive cocci in clusters              Resulting Agency: [facility]           Specimen Collected: 04/15/23 16:05 Last Resulted: 04/17/23 11:43               Recent Studies  ( RISRSLT )    5/15 chest x-ray no acute abnormalities    5/16 liver Doppler ultrasound: Hepatic cirrhosis with portal hypertension

## 2023-04-19 LAB — COMPREHENSIVE METABOLIC PANEL
ALBUMIN: 2.1 g/dL — ABNORMAL LOW (ref 3.4–5.0)
ALKALINE PHOSPHATASE: 364 U/L — ABNORMAL HIGH (ref 46–116)
ALT (SGPT): 17 U/L (ref 10–49)
ANION GAP: 8 mmol/L (ref 5–14)
AST (SGOT): 52 U/L — ABNORMAL HIGH (ref <=34)
BILIRUBIN TOTAL: 2.2 mg/dL — ABNORMAL HIGH (ref 0.3–1.2)
BLOOD UREA NITROGEN: 9 mg/dL (ref 9–23)
BUN / CREAT RATIO: 17
CALCIUM: 8.8 mg/dL (ref 8.7–10.4)
CHLORIDE: 105 mmol/L (ref 98–107)
CO2: 25.4 mmol/L (ref 20.0–31.0)
CREATININE: 0.52 mg/dL — ABNORMAL LOW
EGFR CKD-EPI (2021) FEMALE: >90 mL/min/{1.73_m2} (ref >=60)
GLUCOSE RANDOM: 93 mg/dL (ref 70–179)
POTASSIUM: 3.9 mmol/L (ref 3.4–4.8)
PROTEIN TOTAL: 7.2 g/dL (ref 5.7–8.2)
SODIUM: 138 mmol/L (ref 135–145)

## 2023-04-19 LAB — MAGNESIUM: MAGNESIUM: 1.4 mg/dL — ABNORMAL LOW (ref 1.6–2.6)

## 2023-04-19 MED ORDER — FUROSEMIDE 40 MG TABLET
ORAL_TABLET | Freq: Two times a day (BID) | ORAL | 0 refills | 30 days | Status: CP
Start: 2023-04-19 — End: ?
  Filled 2023-05-20: qty 60, 30d supply, fill #0

## 2023-04-19 MED ORDER — MAGNESIUM OXIDE 400 MG (241.3 MG MAGNESIUM) TABLET
ORAL_TABLET | Freq: Two times a day (BID) | ORAL | 11 refills | 30 days | Status: CP
Start: 2023-04-19 — End: 2024-04-18

## 2023-04-19 MED ORDER — SPIRONOLACTONE 100 MG TABLET
ORAL_TABLET | Freq: Two times a day (BID) | ORAL | 0 refills | 30 days | Status: CP
Start: 2023-04-19 — End: 2023-05-19
  Filled 2023-05-20: qty 60, 30d supply, fill #0

## 2023-04-19 MED ADMIN — metFORMIN (GLUCOPHAGE) tablet 1,000 mg: 1000 mg | ORAL | @ 13:00:00 | Stop: 2023-04-19

## 2023-04-19 MED ADMIN — magnesium oxide (MAG-OX) tablet 400 mg: 400 mg | ORAL | @ 19:00:00 | Stop: 2023-04-19

## 2023-04-19 MED ADMIN — HYDROcodone-acetaminophen (NORCO) 5-325 mg per tablet 1 tablet: 1 | ORAL | @ 04:00:00 | Stop: 2023-04-19

## 2023-04-19 MED ADMIN — carvedilol (COREG) tablet 6.25 mg: 6.25 mg | ORAL | @ 13:00:00 | Stop: 2023-04-19

## 2023-04-19 MED ADMIN — insulin glargine (LANTUS) injection 66 Units: 66 [IU] | SUBCUTANEOUS | @ 02:00:00

## 2023-04-19 MED ADMIN — pravastatin (PRAVACHOL) tablet 40 mg: 40 mg | ORAL

## 2023-04-19 MED ADMIN — insulin lispro (HumaLOG) injection 10 Units: 10 [IU] | SUBCUTANEOUS | @ 16:00:00 | Stop: 2023-04-19

## 2023-04-19 MED ADMIN — aspirin chewable tablet 81 mg: 81 mg | ORAL | @ 13:00:00 | Stop: 2023-04-19

## 2023-04-19 MED ADMIN — pantoprazole (Protonix) EC tablet 40 mg: 40 mg | ORAL

## 2023-04-19 MED ADMIN — rifAXIMin (XIFAXAN) tablet 550 mg: 550 mg | ORAL | Stop: 2023-04-22

## 2023-04-19 MED ADMIN — glucose chewable tablet 16 g: 16 g | ORAL | @ 09:00:00 | Stop: 2023-04-19

## 2023-04-19 MED ADMIN — mirtazapine (REMERON) tablet 7.5 mg: 7.5 mg | ORAL

## 2023-04-19 MED ADMIN — insulin lispro (HumaLOG) injection 10 Units: 10 [IU] | SUBCUTANEOUS | @ 13:00:00 | Stop: 2023-04-19

## 2023-04-19 MED ADMIN — pantoprazole (Protonix) EC tablet 40 mg: 40 mg | ORAL | @ 13:00:00 | Stop: 2023-04-19

## 2023-04-19 MED ADMIN — rifAXIMin (XIFAXAN) tablet 550 mg: 550 mg | ORAL | @ 13:00:00 | Stop: 2023-04-19

## 2023-04-19 NOTE — Unmapped (Signed)
Patient Ashley Briggs, RA. Ambulates independently. No c/o pain today. Per her report multiple loose stools today and 1 urine episode, nurse did assess urine. No additional educational needs noted at this time.   Problem: Adult Inpatient Plan of Care  Goal: Plan of Care Review  Outcome: Progressing  Goal: Patient-Specific Goal (Individualized)  Outcome: Progressing  Goal: Absence of Hospital-Acquired Illness or Injury  Outcome: Progressing  Intervention: Identify and Manage Fall Risk  Recent Flowsheet Documentation  Taken 04/18/2023 1600 by Olena Leatherwood, RN  Safety Interventions:   fall reduction program maintained   low bed   lighting adjusted for tasks/safety  Taken 04/18/2023 1400 by Olena Leatherwood, RN  Safety Interventions:   fall reduction program maintained   low bed   lighting adjusted for tasks/safety  Taken 04/18/2023 1200 by Olena Leatherwood, RN  Safety Interventions:   fall reduction program maintained   low bed   lighting adjusted for tasks/safety  Taken 04/18/2023 1000 by Olena Leatherwood, RN  Safety Interventions:   fall reduction program maintained   low bed   lighting adjusted for tasks/safety  Taken 04/18/2023 0800 by Olena Leatherwood, RN  Safety Interventions:   fall reduction program maintained   low bed   lighting adjusted for tasks/safety  Taken 04/18/2023 0720 by Olena Leatherwood, RN  Safety Interventions:   fall reduction program maintained   low bed   lighting adjusted for tasks/safety  Intervention: Prevent Skin Injury  Recent Flowsheet Documentation  Taken 04/18/2023 1600 by Olena Leatherwood, RN  Positioning for Skin: Supine/Back  Taken 04/18/2023 1400 by Olena Leatherwood, RN  Positioning for Skin: Sitting in Chair  Taken 04/18/2023 1200 by Olena Leatherwood, RN  Positioning for Skin: Supine/Back  Taken 04/18/2023 1000 by Olena Leatherwood, RN  Positioning for Skin: Supine/Back  Taken 04/18/2023 0905 by Olena Leatherwood, RN  Positioning for Skin: Supine/Back  Taken 04/18/2023 0800 by Olena Leatherwood, RN  Positioning for Skin:   Supine/Back   Left  Taken 04/18/2023 0720 by Olena Leatherwood, RN  Positioning for Skin: Supine/Back  Intervention: Prevent and Manage VTE (Venous Thromboembolism) Risk  Recent Flowsheet Documentation  Taken 04/18/2023 0905 by Olena Leatherwood, RN  VTE Prevention/Management:   fluids promoted   ambulation promoted  Goal: Optimal Comfort and Wellbeing  Outcome: Progressing  Goal: Readiness for Transition of Care  Outcome: Progressing  Goal: Rounds/Family Conference  Outcome: Progressing     Problem: Fall Injury Risk  Goal: Absence of Fall and Fall-Related Injury  Outcome: Progressing  Intervention: Promote Injury-Free Environment  Recent Flowsheet Documentation  Taken 04/18/2023 1600 by Olena Leatherwood, RN  Safety Interventions:   fall reduction program maintained   low bed   lighting adjusted for tasks/safety  Taken 04/18/2023 1400 by Olena Leatherwood, RN  Safety Interventions:   fall reduction program maintained   low bed   lighting adjusted for tasks/safety  Taken 04/18/2023 1200 by Olena Leatherwood, RN  Safety Interventions:   fall reduction program maintained   low bed   lighting adjusted for tasks/safety  Taken 04/18/2023 1000 by Olena Leatherwood, RN  Safety Interventions:   fall reduction program maintained   low bed   lighting adjusted for tasks/safety  Taken 04/18/2023 0800 by Olena Leatherwood, RN  Safety Interventions:   fall reduction program maintained   low bed   lighting adjusted for tasks/safety  Taken 04/18/2023 0720 by Olena Leatherwood, RN  Safety Interventions:  fall reduction program maintained   low bed   lighting adjusted for tasks/safety     Problem: Fluid Volume Excess  Goal: Fluid Balance  Outcome: Progressing

## 2023-04-19 NOTE — Unmapped (Signed)
Physician Discharge Summary HBR  4 BT1 HBR  [address]  [place] [address]  Dept: [phone]  Loc: [phone]     Identifying Information:   Ashley Briggs  03/15/1966  [id number]    Primary Care Physician: Karie Georges Pap, DO   Code Status: Full Code    Admit Date: 04/15/2023    Discharge Date: 04/19/2023     Discharge To: Home    Discharge Service: HBR - HBC: Hospitalist Service #2     Discharge Attending Physician: Kateri Plummer, MD    Discharge Diagnoses:  Principal Problem:    Other ascites (POA: Yes)  Active Problems:    Hypertension (POA: Yes)    Diabetes mellitus (CMS-HCC) (POA: Yes)    Hepatic cirrhosis (CMS-HCC) (POA: Yes)    Hepatic encephalopathy (CMS-HCC) (POA: Yes)    Portal hypertension (CMS-HCC) (POA: Yes)  Resolved Problems:    * No resolved hospital problems. *      Outpatient Provider Follow Up Issues:   [  ] Repeat CMP in 1 week with PCP after increasing diuretics  [  ] Hepatology and GI follow up to be scheduled    Hospital Course:   Ashley Briggs is a 57 y.o. 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       Ascites/Hepatic cirrhosis with history of HE, ascites, varices:  Presents primarily with weight gain and evidence of worsening ascites and LE edema like due to increase in fluid intake related to dry mouth. No other obvious source, LFTs stable from previous, no pain, fever to suggest other complications. No evidence of GI bleeding, hepatic encephalopathy. Liver doppler negative for clot. Tolerated increased doses of her spironolactone and lasix. Recommended follow up with PCP in 1 week to check labs and follow up in hepatology clinic. Continued on lactulose, rifaxmin, and carvedilol.       Dysphagia  Patient reports increasing frequency of choking on her food intake and secretions, reports this has been ongoing but slightly increased since her EGD in 01/2023. 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 Path results as follows: Fragments of esophageal squamous epithelium with increased intraepithelial lymphocytic infiltrate, nonspecific; cannot exclude lichenoid esophagitis in the appropriate clinical setting. This was noted at outpatient appointment and pt referred to [facility]. Repeat referral ordered as not clear if it was approved.     Positive peritoneal fluid culture  Patient's peritoneal fluid positive for both Staph epi and Staph aureus in broth only. Cell count low at 167 with no significant neutrophilia documented. No abdominal pain, fever, leukocytosis, or clinical worsening off of antibiotics. E-consulted ID and felt very likely to be a contaminant given two bacteria growing from broth only and both skin flora. Considered repeat paracentesis but upon evaluation, did not have safe pocket.      Secondary/Additional Active Problems:  Hypertension:  Normal pressures on arrival.  On carvedilol (also for varices/portal HTN).     Diabetes mellitus (CMS-HCC):  Last HbA1c 8.4% in March.  Now on glargine insulin with short acting with meals as well SGLT2 inhibitor, GLP1 agonist and metformin. Follows with [facility] and has upcoming appointment with her outpatient provider in June. Continue home regimen upon discharge.      Microcytic anemia  No overt bleeding but at risk for bleeding with EV and PHG (noted on  EGD 01/2023). Ferritin 107, iron sat 6%. Continue PO iron at discharge.  -       Procedures:  paracentesis  No admission procedures for hospital encounter.  ______________________________________________________________________  Discharge Medications:     Your Medication List        STOP taking these medications      oxyCODONE 5 MG immediate release tablet  Commonly known as: ROXICODONE     zolpidem 5 MG tablet  Commonly known as: AMBIEN START taking these medications      magnesium oxide 400 mg (241.3 mg elemental) tablet  Commonly known as: MAG-OX  Take 1 tablet (400 mg total) by mouth two (2) times a day.            CHANGE how you take these medications      furosemide 40 MG tablet  Commonly known as: LASIX  Take 1 tablet (40 mg total) by mouth two (2) times a day.  What changed: when to take this     LANTUS SOLOSTAR U-100 INSULIN 100 unit/mL (3 mL) injection pen  Generic drug: insulin glargine  Inject 0.6 mL (60 Units total) under the skin nightly.  What changed: how much to take     SLOW FE 142 mg (45 mg iron) Tber  Generic drug: ferrous sulfate  Take 1 tablet (142 mg) by mouth two (2) times a day.  What changed: when to take this     spironolactone 100 MG tablet  Commonly known as: ALDACTONE  Take 1 tablet (100 mg total) by mouth two (2) times a day.  What changed: when to take this            CONTINUE taking these medications      ACCU-CHEK GUIDE TEST STRIPS Strp  Generic drug: blood sugar diagnostic  Test two (2) times a day (30 minutes before a meal).     ACCU-CHEK SOFTCLIX LANCETS lancets  Generic drug: lancets  Use as directed to test once daily.     acetaminophen 325 MG tablet  Commonly known as: TYLENOL  Take 2 tablets (650 mg total) by mouth two (2) times a day as needed for pain.     aspirin 81 MG tablet  Commonly known as: ECOTRIN  Take 1 tablet (81 mg total) by mouth daily.     blood-glucose meter kit  Use as directed by provider.     buPROPion 150 MG 24 hr tablet  Commonly known as: Wellbutrin XL  Take 1 tablet (150 mg total) by mouth daily.     carvedilol 6.25 MG tablet  Commonly known as: COREG  Take 1 tablet (6.25 mg total) by mouth Two (2) times a day. Start with 1 tablet nightly, if tolerating well increase to twice daily     chlorhexidine 0.12 % solution  Commonly known as: PERIDEX  15 mL by Oromucosal route two (2) times a day.     cholestyramine light 4 gram packet  Generic drug: cholestyramine-aspartame  Mix 1 packet as directed and take by mouth daily.     clobetasol 0.05 % ointment  Commonly known as: TEMOVATE  Apply the medication twice daily to stubborn areas of the skin until smooth. Then stop and re-start as the skin changes come back.     COSENTYX PEN (2 PENS) 150 mg/mL Pnij injection  Generic drug: secukinumab  Inject the contents of 2 pens (300 mg total) under the skin every twenty-eight (28) days. Maintenance dose     COSENTYX PEN (2  PENS) 150 mg/mL Pnij injection  Generic drug: secukinumab  Inject the contents of 2 pens (150 mg total) under the skin weekly for 5 weeks. Loading dose.     HYDROcodone-acetaminophen 5-325 mg per tablet  Commonly known as: NORCO  Take 1 tablet by mouth every eight (8) hours as needed for pain (severe pain).     insulin lispro 100 unit/mL injection pen  Commonly known as: HumaLOG  Inject 10 Units under the skin Three (3) times a day before meals.     INVOKANA 300 mg Tab tablet  Generic drug: canagliflozin  Take 1 tablet (300 mg total) by mouth daily before breakfast. NEED APPOINTMENT FOR FURTHER REFILLS.     lactulose 10 gram/15 mL solution  Take 30 mL (20 g total) by mouth Three (3) times a day.     metFORMIN 500 MG 24 hr tablet  Commonly known as: GLUCOPHAGE-XR  Take 2 tablets (1,000 mg total) by mouth in the morning and 2 tablets (1,000 mg total) in the evening. Take with meals.     mirtazapine 7.5 MG tablet  Commonly known as: REMERON  Take 1 tablet (7.5 mg total) by mouth.     pantoprazole 40 MG tablet  Commonly known as: Protonix  Take 1 tablet (40 mg total) by mouth two (2) times a day.     promethazine 12.5 MG tablet  Commonly known as: PHENERGAN  Take 1 tablet (12.5 mg total) by mouth every eight (8) hours as needed for nausea.     simvastatin 20 MG tablet  Commonly known as: ZOCOR  Take 1 tablet (20 mg total) by mouth every evening.     triamcinolone 0.1 % ointment  Commonly known as: KENALOG  Apply the medication twice daily to affected areas of the skin until smooth. Then stop and re-start as soon as the skin changes come back.     TRULICITY 1.5 mg/0.5 mL Pnij  Generic drug: dulaglutide  Inject the contents of 2 syringes (3 mg total) under the skin every seven (7) days.     ULTICARE PEN NEEDLE 32 gauge x 1/4 (6 mm) Ndle  Generic drug: pen needle, diabetic  Inject 1 each under the skin nightly.     XIFAXAN 550 mg Tab  Generic drug: rifAXIMin  Take 1 tablet (550 mg total) by mouth two (2) times a day.              Allergies:  Patient has no known allergies.  ______________________________________________________________________  Pending Test Results (if blank, then none):      Most Recent Labs:  All lab results last 24 hours -   Recent Results (from the past 24 hour(s))   POCT Glucose    Collection Time: 04/19/23  5:01 AM   Result Value Ref Range    Glucose, POC 65 (L) 70 - 179 mg/dL   POCT Glucose    Collection Time: 04/19/23  5:18 AM   Result Value Ref Range    Glucose, POC 74 70 - 179 mg/dL   POCT Glucose    Collection Time: 04/19/23  5:36 AM   Result Value Ref Range    Glucose, POC 117 70 - 179 mg/dL   POCT Glucose    Collection Time: 04/19/23 11:27 AM   Result Value Ref Range    Glucose, POC 104 70 - 179 mg/dL   Comprehensive Metabolic Panel    Collection Time: 04/19/23 12:08 PM   Result Value Ref Range    Sodium 138  135 - 145 mmol/L    Potassium 3.9 3.4 - 4.8 mmol/L    Chloride 105 98 - 107 mmol/L    CO2 25.4 20.0 - 31.0 mmol/L    Anion Gap 8 5 - 14 mmol/L    BUN 9 9 - 23 mg/dL    Creatinine 1.61 (L) 0.55 - 1.02 mg/dL    BUN/Creatinine Ratio 17     eGFR CKD-EPI (2021) Female >90 >=60 mL/min/1.12m2    Glucose 93 70 - 179 mg/dL    Calcium 8.8 8.7 - 09.6 mg/dL    Albumin 2.1 (L) 3.4 - 5.0 g/dL    Total Protein 7.2 5.7 - 8.2 g/dL    Total Bilirubin 2.2 (H) 0.3 - 1.2 mg/dL    AST 52 (H) <=04 U/L    ALT 17 10 - 49 U/L    Alkaline Phosphatase 364 (H) 46 - 116 U/L   Magnesium Level    Collection Time: 04/19/23 12:08 PM   Result Value Ref Range    Magnesium 1.4 (L) 1.6 - 2.6 mg/dL Relevant Studies/Radiology (if blank, then none):  US Liver Doppler    Result Date: 04/16/2023  EXAM: US LIVER DOPPLER ACCESSION: [id number] UN CLINICAL INDICATION: 57 years old with Rule out clot in setting of worsening ascites  COMPARISON: 02/18/2023 TECHNIQUE: Ultrasound views of the complete abdomen were obtained using grayscale, color Doppler, and spectral Doppler analysis. FINDINGS: Limited examination secondary to inability to roll the patient in a lateral decubitus position. LIVER: The liver was heterogeneous in echotexture and borderline enlarged. The liver demonstrates a nodular contour, compatible with cirrhosis. No definite hepatic lesions. No intrahepatic biliary ductal dilatation. The common bile duct was borderline dilated.      Liver:   17.25  cm      Common bile duct:   0.67  cm GALLBLADDER: The gallbladder is surgically absent. PANCREAS: Visualized portion was unremarkable. SPLEEN: Enlarged with normal echotexture.      Spleen: 18.1 cm KIDNEYS: Normal in size and echotexture. No solid masses or calculi. No hydronephrosis.      Right kidney: 10.7 cm      Left kidney: 12.0 cm VESSELS - Portal vein: The main, left and right portal veins were patent with hepatopetal flow. Normal main portal vein velocity (0.20 m/s or greater)      Main portal vein diameter:   0.95  cm      Main portal vein velocity:   0.27  m/s      Anterior right portal vein velocity:   0.17  m/s      Posterior right portal vein velocity:   0.19  m/s      Left portal vein velocity: 0.13 m/s      Main portal vein flow: hepatopetal      Right portal vein flow: hepatopetal      Left portal vein flow: hepatopetal - Splenic vein: Patent, with hepatopetal flow.      Splenic vein midline: hepatopetal      Splenic vein proximal: hepatopetal - Hepatic veins/IVC: The IVC, left, middle and right hepatic veins were patent but with dampened waveforms.      Left hepatic vein phasicity/flow: biphasic      Middle hepatic vein phasicity/flow: biphasic      Right hepatic vein phasicity/flow: biphasic      Inferior vena cava phasicity/flow: monophasic - Hepatic artery: Patent with color and spectral Doppler imaging      Common hepatic artery: Patent - Visualized proximal aorta:  unremarkable  Aorta: not well visualized OTHER: Moderate volume ascites.     Patent hepatic vasculature with normal flow direction. Hepatic cirrhosis with portal hypertension. Sequelae of cholecystectomy.    ECG 12 Lead    Result Date: 04/16/2023  SINUS RHYTHM WITH 1ST DEGREE AV BLOCK WITH PREMATURE ATRIAL BEATS INFERIOR INFARCT (CITED ON OR BEFORE 15-Apr-2023) CANNOT RULE OUT ANTERIOR INFARCT  (CITED ON OR BEFORE 15-Apr-2023) ABNORMAL ECG WHEN COMPARED WITH ECG OF 23-Sep-2022 09:53, PREMATURE ATRIAL BEATS ARE NOW PRESENT PR INTERVAL HAS INCREASED Confirmed by Mariane Baumgarten ([id number]) on 04/16/2023 8:09:59 AM    XR Chest 1 view Portable    Result Date: 04/15/2023  EXAM: XR CHEST PORTABLE ACCESSION: [id number] UN CLINICAL INDICATION: DYSPNEA  TECHNIQUE: Single View AP Chest Radiograph. COMPARISON: 09/21/2022 FINDINGS: Lungs are clear.  No pleural effusion or pneumothorax. Normal heart size and mediastinal contours.     No acute abnormalities.   ______________________________________________________________________  Discharge Instructions:   Activity Instructions       Activity as tolerated                  Other Instructions       Call MD for:  difficulty breathing, headache or visual disturbances      Call MD for:  persistent dizziness or light-headedness      Call MD for:  persistent nausea or vomiting      Call MD for: Temperature > 38.5 Celsius ( > 101.3 Fahrenheit)      Discharge instructions      Discharge Instructions:    You were admitted to [facility] for worsening ascites (fluid in the belly). You were treated with increased doses of diuretic medications. You are now ready to discharge and will be discharging to: Home.    Please carefully read and follow these instructions below upon your discharge. It has been a pleasure taking care of you, and we wish you the best.     1) Please take your medications as prescribed. Major medication changes are listed below, and a full list of medications is in this discharge packet. At future follow-up appointments, please be sure to take all of your medications with you so your provider can better guide your care.     MEDICATION CHANGES:  -- Increase your lasix and spironolactone to twice per day. Please follow up with your PCP in 1 week to get your labs checked. If you cannot follow up within two weeks, please decrease your medications back to once per day until you can get your labs checked. Please start magnesium pills twice a day. You may be able to decrease your lactulose frequency as the magnesium can increase the frequency of bowel movements.     FOLLOW-UP:  2) Seek medical care with your primary care doctor or local Emergency Room or Urgent Care if you develop any changes in your mental status, worsening abdominal pain, fevers greater than 101.5, any unexplained/unrelieved shortness of breath, uncontrolled nausea and vomiting that keeps you from remaining hydrated or taking your medication, or any other concerning symptoms.     3) It is recommended that you see your primary care provider (PCP) after being in the hospital. If you do not see an appointment listed below with your primary care doctor, please call your doctor's office as soon as possible to schedule an appointment to be seen within 7-10 days of discharge. Your PCP is Mangel, Benison Pap, DO and the phone number of their office is [phone]. Please call your PCP's office  as soon as possible to schedule an appointment with them.     4) Please call the GI/Hepatology clinic to follow up with your specialists.      [facility] [phone]      5) Please go to any follow-up appointments that were already scheduled. Some of your follow-up appointments have been listed in this discharge packet.            Follow Up instructions and Outpatient Referrals     Call MD for:  difficulty breathing, headache or visual disturbances      Call MD for:  persistent dizziness or light-headedness      Call MD for:  persistent nausea or vomiting      Call MD for: Temperature > 38.5 Celsius ( > 101.3 Fahrenheit)      Discharge instructions      Order to schedule follow-up visit with primary care physician      Instructions for follow-up: Hospital follow up, preferably by 04/24/23 for   lab check        Appointments which have been scheduled for you      Apr 24, 2023 10:00 AM  (Arrive by 9:45 AM)  RETURN  SPORTS with Angelia Mould, FNP  [facility] ORTHOPAEDICS [place] [facility] REGION) [address]  [address]  [place] [address]  [phone]        May 03, 2023 10:30 AM  (Arrive by 10:15 AM)  MRI BRAIN W WO CONTRAST    -UN with HBR MRI RM 1  IMG MRI [facility] [place]) [address]  [place] [address]  [phone]   On appt date:  Bring recent lab work  Bring documentation of any metal object implants  Take meds as usual  Check w/physician if diabetic  You will be asked to change into a gown for your safety    On appt date do not:  No restrictions on food/drink  Wear metallic items including jewelry (we are not responsible for lost items)    Let us know if pt:  Claustrophobic  Metal object implant  Pregnant  Prescribed a sedative  On dialysis  Allergic to MRI dye/contrast  Kidney Failure    (Title:MRIWCNTRST)         May 03, 2023 11:30 AM  (Arrive by 11:15 AM)  CT NECK SOFT TISSUE W CONTRAST with HBR CT RM 1  IMG CT [facility] [place]) [address]  [place] [address]  [phone]   On appt date:  Drink lots of water 24 hrs  Bring recent lab work  Take meds as usual  [employer] fast streamer of current meds  Bring snack if diabetic    Let us know if pt:  Allergic to contrast dyes  Diabetic  Pregnant or nursing  Claustrophobic    (Title:CTWCNTRST)         May 20, 2023 12:00 PM  (Arrive by 11:45 AM)  RETURN  ENDOCRINE with Jacqualine Code, MD  [facility] ENDOCRINOLOGY AT [facility]) [address] [place] [address]  [phone]        May 26, 2023 11:00 AM  (Arrive by 10:45 AM)  RETURN  GENERAL with Abbie Sons, MD  [facility] DERMATOLOGY AND SKIN CANCER CENTER [facility] REGION) [address] [place] [address]  [place] [address]  [phone]        Jun 02, 2023 8:30 AM  (Arrive by 8:05 AM)  NEW NEUROLOGY with Garnette Scheuermann, MD  [facility] NEUROLOGY CLINIC [place] [facility] REGION) [address]  [place] [address]  [phone]        Jul 13, 2023 10:00 AM  (Arrive by 9:45 AM)  NEW  ESO with Jolly Sever, NP  [facility] GI MEDICINE [place] [facility] [place] [facility]) [address]  [facility] [address] through [address]  [place] [address]  [phone]        Aug 04, 2023 11:00 AM  (Arrive by 10:30 AM)  RETURN  HEPATOLOGY with Kennedy Bucker, MD  [facility] LIVER TRANSPLANT [facility] REGION) [address]  [place] [address]  [phone]        Sep 09, 2023 12:00 PM  (Arrive by 11:45 AM)  MRI BRAIN W WO CONTRAST    -UN with HBR MRI MBL  IMG MRI [facility] [place]) [address]  [place] [address]  [phone]   On appt date:  Bring recent lab work  Bring documentation of any metal object implants  Take meds as usual  Check w/physician if diabetic  You will be asked to change into a gown for your safety    On appt date do not:  No restrictions on food/drink  Wear metallic items including jewelry (we are not responsible for lost items)    Let us know if pt:  Claustrophobic  Metal object implant  Pregnant  Prescribed a sedative  On dialysis  Allergic to MRI dye/contrast  Kidney Failure    (Title:MRIWCNTRST)              ______________________________________________________________________  Discharge Day Services:  BP 112/54  - Pulse 72  - Temp 36 ??C (96.8 ??F) (Temporal)  - Resp 18  - Ht 162.6 cm (5' 4)  - Wt 98.2 kg (216 lb 6.4 oz)  - LMP 08/03/2006  - SpO2 97%  - BMI 37.14 kg/m??   Pt seen on the day of discharge and determined appropriate for discharge.    Condition at Discharge: good    Length of Discharge: I spent greater than 30 mins in the discharge of this patient.    Juanetta Beets, MD  Division of [facility] Medicine

## 2023-04-19 NOTE — Unmapped (Signed)
A &O x 4, admitted with ascites S/p 4.2L LVP 05/15. Reports x 2 episodes of watery stools 05/18. Denies CP/SOB. Sling to RUE, pain managed with PRN Norco. Fall precautions maintained. Plan of care continued.      Problem: Adult Inpatient Plan of Care  Goal: Plan of Care Review  Outcome: Ongoing - Unchanged  Flowsheets (Taken 04/19/2023 0241)  Plan of Care Reviewed With: patient  Goal: Patient-Specific Goal (Individualized)  Outcome: Ongoing - Unchanged  Goal: Absence of Hospital-Acquired Illness or Injury  Outcome: Ongoing - Unchanged  Intervention: Identify and Manage Fall Risk  Recent Flowsheet Documentation  Taken 04/19/2023 0200 by Nicanor Alcon, RN  Safety Interventions:   fall reduction program maintained   low bed  Taken 04/19/2023 0027 by Nicanor Alcon, RN  Safety Interventions:   fall reduction program maintained   low bed  Taken 04/18/2023 2200 by Nicanor Alcon, RN  Safety Interventions:   fall reduction program maintained   low bed  Taken 04/18/2023 2000 by Nicanor Alcon, RN  Safety Interventions:   fall reduction program maintained   low bed  Intervention: Prevent Skin Injury  Recent Flowsheet Documentation  Taken 04/19/2023 0200 by Nicanor Alcon, RN  Positioning for Skin: Supine/Back  Taken 04/19/2023 0027 by Nicanor Alcon, RN  Positioning for Skin: Left  Taken 04/18/2023 2200 by Nicanor Alcon, RN  Positioning for Skin: Supine/Back  Taken 04/18/2023 2000 by Nicanor Alcon, RN  Positioning for Skin: Sitting in Chair  Goal: Optimal Comfort and Wellbeing  Outcome: Ongoing - Unchanged  Goal: Readiness for Transition of Care  Outcome: Ongoing - Unchanged  Goal: Rounds/Family Conference  Outcome: Ongoing - Unchanged     Problem: Fall Injury Risk  Goal: Absence of Fall and Fall-Related Injury  Outcome: Ongoing - Unchanged  Intervention: Promote Injury-Free Environment  Recent Flowsheet Documentation  Taken 04/19/2023 0200 by Nicanor Alcon, RN  Safety Interventions:   fall reduction program maintained   low bed  Taken 04/19/2023 0027 by Nicanor Alcon, RN  Safety Interventions:   fall reduction program maintained   low bed  Taken 04/18/2023 2200 by Nicanor Alcon, RN  Safety Interventions:   fall reduction program maintained   low bed  Taken 04/18/2023 2000 by Nicanor Alcon, RN  Safety Interventions:   fall reduction program maintained   low bed     Problem: Fluid Volume Excess  Goal: Fluid Balance  Outcome: Ongoing - Unchanged

## 2023-04-20 NOTE — Unmapped (Signed)
[facility] Refill Coordination Note    Ashley Briggs, [place]: 09-16-1966  Phone: [phone] (home)       All above HIPAA information was verified with patient.         04/17/2023    12:16 PM   Specialty Rx Medication Refill Questionnaire   Which Medications would you like refilled and shipped? Xifaxan, Cosyntex   Please list all current allergies: None   Have you missed any doses in the last 30 days? No   Have you had any changes to your medication(s) since your last refill? No   How many days remaining of each medication do you have at home? Xixfan 6 days, Cosyntex 0   If receiving an injectable medication, next injection date is 04/28/2023   Have you experienced any side effects in the last 30 days? No   Please enter the full address (street address, city, state, zip code) where you would like your medication(s) to be delivered to. [address], [place],  [place] [address]   Please specify on which day you would like your medication(s) to arrive. Note: if you need your medication(s) within 3 days, please call the pharmacy to schedule your order at [phone]  04/22/2023   Has your insurance changed since your last refill? No   Would you like a pharmacist to call you to discuss your medication(s)? No   Do you require a signature for your package? (Note: if we are billing Medicare Part B or your order contains a controlled substance, we will require a signature) No         Completed refill call assessment today to schedule patient's medication shipment from the [facility] [phone]).  All relevant notes have been reviewed.       Confirmed patient received a Conservation officer, historic buildings and a Surveyor, mining with first shipment. The patient will receive a drug information handout for each medication shipped and additional FDA Medication Guides as required.         REFERRAL TO PHARMACIST     Referral to the pharmacist: Not needed      [facility]     Shipping address confirmed in Epic.     Delivery Scheduled: Yes, Expected medication delivery date: 04/22/2023.     Medication will be delivered via Same Day Courier to the prescription address in Epic WAM.    Dorisann Frames   [facility] Specialty Technician

## 2023-04-22 MED FILL — XIFAXAN 550 MG TABLET: ORAL | 28 days supply | Qty: 56 | Fill #6

## 2023-04-22 MED FILL — COSENTYX PEN 300 MG/2 PENS (150 MG/ML) SUBCUTANEOUS: SUBCUTANEOUS | 28 days supply | Qty: 2 | Fill #4

## 2023-04-23 ENCOUNTER — Other Ambulatory Visit: Payer: Self-pay | Admitting: Psychiatry

## 2023-04-26 ENCOUNTER — Other Ambulatory Visit: Payer: Self-pay | Admitting: Psychiatry

## 2023-04-28 DIAGNOSIS — G4733 Obstructive sleep apnea (adult) (pediatric): Secondary | ICD-10-CM | POA: Diagnosis not present

## 2023-04-28 DIAGNOSIS — G4719 Other hypersomnia: Secondary | ICD-10-CM

## 2023-04-30 ENCOUNTER — Ambulatory Visit: Payer: Medicaid Other | Admitting: Psychiatry

## 2023-05-01 ENCOUNTER — Ambulatory Visit: Admit: 2023-05-01 | Discharge: 2023-05-02 | Payer: PRIVATE HEALTH INSURANCE | Attending: Family | Primary: Family

## 2023-05-01 ENCOUNTER — Ambulatory Visit: Admit: 2023-05-01 | Discharge: 2023-05-02 | Payer: PRIVATE HEALTH INSURANCE

## 2023-05-01 NOTE — Unmapped (Signed)
lvm to offer earlier slot 6/3-5

## 2023-05-01 NOTE — Unmapped (Signed)
Plan: Try not to use sling at all.  Work on range of motion of the shoulder as able.  Begin working with a physical therapist.  Please contact me with any questions or concerns.      Thank you for coming to [facility] and our clinic today!     We aim to provide you with the highest quality, individualized care.  If you have any unanswered questions after the visit, please do not hesitate to reach out to [place] on MyChart or leave a message for the nurse.  ?  MyChart messages: These messages can be sent to your provider and will be checked by their clinical support staff.? The messages are checked throughout the day during normal business hours from 8:30 am-4:00 pm Monday-Friday, however responses may take up to 48 hours.? Please use this method of communication for non-urgent and non-emergent concerns, questions, refill requests or inquiries only.? ?Our team will help respond to all of your questions.? Please note that you may be asked to see a provider by either a telehealth or in person visit if it is deemed your questions are best handled in the clinic setting in person.??  ?  Please keep in mind, these messages are not real time communications, so be patient when waiting for a response.    If you do not have access to MyChart, do not know how to use MyChart or have an issue that may require more extensive discussion, please call the nurses' call line: [phone].? This line is checked throughout the day and will be responded to as time allows.? Please note that return calls could take up to 48 hours, depending on the nature of the need.?  ?  If you have an issue that requires emergent attention that cannot wait; either call the Orthopaedics resident on call at [phone], consider coming to our [facility] walk-in clinic, or go to the nearest Emergency Department.    If you need to schedule future appointments, please call [phone].     We look forward to seeing you again in the future and appreciate you choosing [facility] for your care!    Thank you,                We provide innovative and comprehensive patient centered care that is supported by evidence-based research                                                                                                    RESEARCH PARTICIPATION    Please check out our current research studies to see if you or someone you know may qualify at:    [email or web address]

## 2023-05-01 NOTE — Unmapped (Signed)
SPORTS MEDICINE RETURN VISIT    ASSESSMENT AND PLAN      Diagnosis ICD-10-CM Associated Orders   1. Closed fracture of proximal end of right humerus with routine healing, unspecified fracture morphology, subsequent encounter  S42.201D XR Shoulder 3 Or More Views Right     Ambulatory referral to Physical Therapy           Unfortunately, she has developed adhesive capsulitis after her injury.  She has good evidence of fracture healing on x-ray but I recommend another 4 weeks or so of healing before considering large volume glenohumeral injection as she had in the past for atraumatic adhesive capsulitis on the left.  I recommended complete discontinuation of her sling and beginning physical therapy for which a referral was provided.     Return for Large-volume right glenohumeral injection/procedure with Dr. Jacqlyn Krauss.    Procedure(s):  none      SUBJECTIVE     Chief Complaint:   Chief Complaint   Patient presents with    Right Shoulder - Fracture, Follow-up     Doing well but still has pain  Only using sling 3x per week         History of Present Illness: 57 y.o. female who presents for R proximal humerus fracture sustained in a fall when walking her dog on 03/18/23; seen at outside ortho who she states recommended surgery. She had a fall onto the floor in her home yesterday and was transported to the [facility] ED via EMS.  She followed up with me in clinic on 04/01/2023 and nonoperative treatment was recommended.  Unfortunately, she has had a recent hospitalization related to cirrhosis and ascites. She notes some numbness in last 2 digits. Still using sling. Has diffuse pain about the shoulder.     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 2 diabetes mellitus (CMS-HCC) 12/02/2022    Dry eye syndrome, bilateral 12/21/2020    Dysphagia 02/02/2023    Endometrial cancer (CMS-HCC) 2007    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    Liver disease     Major depression     Morbid obesity (CMS-HCC) 11/10/2019    Myopia of both eyes with astigmatism and presbyopia 12/02/2022    Nausea  09/22/2022    Non-alcoholic fatty liver disease     NPDR (nonproliferative diabetic retinopathy) (CMS-HCC)     Right eye    Other ascites 09/22/2022    Other chronic pain 11/06/2022    Other insomnia 11/06/2022    PFD (pelvic floor dysfunction) 04/07/2013    Portal hypertension (CMS-HCC) 10/08/2022    Psoriasis     Ptosis of eyelid, left     Left upper eyelid    Reflux     Retinopathy of left eye, background, proliferative 01/09/2021    Right upper quadrant abdominal pain 09/22/2022    Secondary esophageal varices without bleeding (CMS-HCC) 06/17/2022    Type 2 diabetes mellitus, with long-term current use of insulin (CMS-HCC) 12/12/2015    Last Assessment & Plan:   The current medical regimen is effective;  continue present plan and medications.  Patient will continue good management of diabetes especially with weight loss. Hopefully with further weight loss will run into the problem of being overmedicated and having to cut back on medications     Some confusion on diabetes medications was have not.been refilled for over a year.  Pa         OBJECTIVE     Physical Exam:  Vitals:   Wt Readings from Last 3 Encounters:   04/19/23 98.2 kg (216 lb 6.4 oz)   03/24/23 91.6 kg (202 lb)   03/06/23 94 kg (207 lb 4.8 oz)     Estimated body mass index is 37.14 kg/m?? as calculated from the following:    Height as of 04/16/23: 162.6 cm (5' 4).    Weight as of 04/19/23: 98.2 kg (216 lb 6.4 oz).  Gen: Well-appearing female in no acute distress  MSK: No obvious deformity of the right shoulder.  Tenderness to palpation about right proximal humerus.  No appreciable passive external rotation.  Active forward elevation to 75 degrees.  Negative Tinel's at the elbow.  Elbow range of motion full and painless.    Neurovascularly intact distally.  Imaging/other tests: R shoulder x-rays taken today and reviewed with patient in exam room show:   Healing comminuted right neck of humerus fracture extending to involve the tuberosities. No interval change in alignment. Diffuse osteopenia.     @SPORTSPROMIS @      ADMINISTRATIVE     I have personally reviewed and interpreted the images (as available).  Point-of-care ultrasound imaging is on file and stored in a permanent location (if performed).  I have personally reviewed prior records and incorporated relevant information above (as available).    @SMIBILLING @    PROCEDURES     Procedures     DME     DME ORDER:  Dx:  ,

## 2023-05-03 ENCOUNTER — Ambulatory Visit: Admit: 2023-05-03 | Discharge: 2023-05-04 | Payer: PRIVATE HEALTH INSURANCE

## 2023-05-03 MED ADMIN — iohexol (OMNIPAQUE) 350 mg iodine/mL solution 75 mL: 75 mL | INTRAVENOUS | @ 16:00:00 | Stop: 2023-05-03

## 2023-05-03 MED ADMIN — gadobenate dimeglumine (MULTIHANCE) 529 mg/mL (0.1mmol/0.2mL) solution 15 mL: 15 mL | INTRAVENOUS | @ 17:00:00 | Stop: 2023-05-03

## 2023-05-08 ENCOUNTER — Ambulatory Visit
Admit: 2023-05-08 | Discharge: 2023-05-09 | Payer: PRIVATE HEALTH INSURANCE | Attending: Student in an Organized Health Care Education/Training Program | Primary: Student in an Organized Health Care Education/Training Program

## 2023-05-08 DIAGNOSIS — D509 Iron deficiency anemia, unspecified: Principal | ICD-10-CM

## 2023-05-08 DIAGNOSIS — K7460 Unspecified cirrhosis of liver: Principal | ICD-10-CM

## 2023-05-08 DIAGNOSIS — R2990 Unspecified symptoms and signs involving the nervous system: Principal | ICD-10-CM

## 2023-05-08 DIAGNOSIS — Z8639 Personal history of other endocrine, nutritional and metabolic disease: Principal | ICD-10-CM

## 2023-05-08 DIAGNOSIS — S42201S Unspecified fracture of upper end of right humerus, sequela: Principal | ICD-10-CM

## 2023-05-08 DIAGNOSIS — R188 Other ascites: Principal | ICD-10-CM

## 2023-05-08 DIAGNOSIS — Z09 Encounter for follow-up examination after completed treatment for conditions other than malignant neoplasm: Principal | ICD-10-CM

## 2023-05-08 DIAGNOSIS — E538 Deficiency of other specified B group vitamins: Principal | ICD-10-CM

## 2023-05-08 DIAGNOSIS — G4733 Obstructive sleep apnea (adult) (pediatric): Principal | ICD-10-CM

## 2023-05-08 HISTORY — DX: Obstructive sleep apnea (adult) (pediatric): G47.33

## 2023-05-08 LAB — CBC W/ AUTO DIFF
BASOPHILS ABSOLUTE COUNT: 0.1 10*9/L (ref 0.0–0.1)
BASOPHILS RELATIVE PERCENT: 1 %
EOSINOPHILS ABSOLUTE COUNT: 0.1 10*9/L (ref 0.0–0.5)
EOSINOPHILS RELATIVE PERCENT: 2.5 %
HEMATOCRIT: 32 % — ABNORMAL LOW (ref 34.0–44.0)
HEMOGLOBIN: 10.1 g/dL — ABNORMAL LOW (ref 11.3–14.9)
LYMPHOCYTES ABSOLUTE COUNT: 0.7 10*9/L — ABNORMAL LOW (ref 1.1–3.6)
LYMPHOCYTES RELATIVE PERCENT: 13 %
MEAN CORPUSCULAR HEMOGLOBIN CONC: 31.5 g/dL — ABNORMAL LOW (ref 32.0–36.0)
MEAN CORPUSCULAR HEMOGLOBIN: 22.5 pg — ABNORMAL LOW (ref 25.9–32.4)
MEAN CORPUSCULAR VOLUME: 71.4 fL — ABNORMAL LOW (ref 77.6–95.7)
MEAN PLATELET VOLUME: 8 fL (ref 6.8–10.7)
MONOCYTES ABSOLUTE COUNT: 0.6 10*9/L (ref 0.3–0.8)
MONOCYTES RELATIVE PERCENT: 10 %
NEUTROPHILS ABSOLUTE COUNT: 4.1 10*9/L (ref 1.8–7.8)
NEUTROPHILS RELATIVE PERCENT: 73.5 %
PLATELET COUNT: 190 10*9/L (ref 150–450)
RED BLOOD CELL COUNT: 4.49 10*12/L (ref 3.95–5.13)
RED CELL DISTRIBUTION WIDTH: 19.5 % — ABNORMAL HIGH (ref 12.2–15.2)
WBC ADJUSTED: 5.6 10*9/L (ref 3.6–11.2)

## 2023-05-08 LAB — COMPREHENSIVE METABOLIC PANEL
ALBUMIN: 2.6 g/dL — ABNORMAL LOW (ref 3.4–5.0)
ALKALINE PHOSPHATASE: 376 U/L — ABNORMAL HIGH (ref 46–116)
ALT (SGPT): 62 U/L — ABNORMAL HIGH (ref 10–49)
ANION GAP: 12 mmol/L (ref 5–14)
AST (SGOT): 78 U/L — ABNORMAL HIGH (ref <=34)
BILIRUBIN TOTAL: 3.3 mg/dL — ABNORMAL HIGH (ref 0.3–1.2)
BLOOD UREA NITROGEN: 22 mg/dL (ref 9–23)
BUN / CREAT RATIO: 39
CALCIUM: 10.1 mg/dL (ref 8.7–10.4)
CHLORIDE: 96 mmol/L — ABNORMAL LOW (ref 98–107)
CO2: 24 mmol/L (ref 20.0–31.0)
CREATININE: 0.56 mg/dL
EGFR CKD-EPI (2021) FEMALE: >90 mL/min/{1.73_m2} (ref >=60)
GLUCOSE RANDOM: 198 mg/dL — ABNORMAL HIGH (ref 70–99)
POTASSIUM: 4.3 mmol/L (ref 3.5–5.1)
PROTEIN TOTAL: 8.1 g/dL (ref 5.7–8.2)
SODIUM: 132 mmol/L — ABNORMAL LOW (ref 135–145)

## 2023-05-08 LAB — IRON & TIBC
IRON SATURATION: 7 % — ABNORMAL LOW (ref 20–55)
IRON: 27 ug/dL — ABNORMAL LOW
TOTAL IRON BINDING CAPACITY: 387 ug/dL (ref 250–425)

## 2023-05-08 LAB — VITAMIN B12: VITAMIN B-12: 569 pg/mL (ref 211–911)

## 2023-05-08 LAB — MAGNESIUM: MAGNESIUM: 1.6 mg/dL (ref 1.6–2.6)

## 2023-05-08 LAB — SLIDE REVIEW

## 2023-05-08 LAB — FOLATE: FOLATE: 5.5 ng/mL (ref >=5.4)

## 2023-05-08 LAB — FERRITIN: FERRITIN: 27.2 ng/mL

## 2023-05-08 NOTE — Unmapped (Signed)
Ashley Briggs  1966/11/03    Assessment & Plan:    57 yo female presents for follow up  #Hospital discharge follow-up  -Admitted 04/15/23 to 04/19/23 for increased ascites. It appeared that she did undergo paracentesis during this admission. Her peritoneal fluid grew Staph Epi and Staph Aures. ID was consulted and this was thought to have been contamination.    -Medication changes: Spirolactone changed to 100mg  BID, Lasix changed to 40mg  BID, lantus dose 60U at bedtime.   -Additionally she was to STOP: Roxicodone 5mg  and Ambien    #Cirrhosis of liver with ascites, unspecified hepatic cirrhosis type (CMS-HCC), Esophageal varices  -Following with Hepatology   -BB: Carvedilol 6.25mg  BID  -Diuretics: Lasix 40mg  BID, Spirolactone 100mg  BID  -PPI: Protonix 40mg  BID  -Bowel regimens: Lactulose 20g TID   -Additional AgentsL Rifaximin 550mg  BID  -We are rechecking her CMP  -     Comprehensive Metabolic Panel; Future  -Discussed importance of protein intake and Glucerna shakes are okay to drink given she has Diabetes    #Neurological complaint, daily headaches, cognitive decline, memory deficits, hepatic encephalopathy, Calcified cerebral meningioma   -MRI brain w/wo (05/03/23): No acute intracranial abnormality. Mild cerebral atrophy. Similar appearance of likely calcified meningioma along the planum sphenoidale  -MRA head w/wo (05/03/23): Unremarkable MRA of the circle of Willis. 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  -She has an appointment with Neurology in July. We discussed concern for polypharmacy and would defer additional medications to Neurology  -She is to continue to refrain from driving given her neurologic concerns.     #Closed fracture of proximal end of right humerus, unspecified fracture morphology, sequela  -Following with Orthopedics, last visit on 05/01/2023. It was noted that she developed adhesive capsulitis after her injury    #Hypomagnesemia  -Noted to have had low Magnesium in the hospital  -Will repeat labs    #OSA (obstructive sleep apnea)  -She reports that she was diagnosed with OSA while in the hospital    #Vitamin B12 deficiency  -Checking labs:   -     Folate Level  -     Vitamin B12 Level    #H/O diabetic gastroparesis, Nausea  -She does continue to have nausea which we discussed is likely secondary to her gastroparesis (this has been discussed with her previously by other providers it appears as well)   -She is on a GLP-1 agonist which is likely worsening her symptoms which was discussed as well   -Currently she is taking phenergan for symptom management. We should consider changing her phenergan to Metoclopramide for symptomatatic management. Will defer to GI    #Microcytic anemia  -Rechecking labs  -     CBC w/ Differential  -     Iron & TIBC  -     Ferritin    #Cervical adenopathy, Dysphagia, Lichen Planus  -CT neck soft tissue w/ contrast (05/03/23): No evidence for cervical lymphadenopathy. Airway is patent. These results were discussed in depth   -EGD bx(01/22/23):  Fragments of esophageal squamous epithelium with increased intraepithelial lymphocytic infiltrate, nonspecific; cannot exclude lichenoid esophagitis in the appropriate clinical setting  -Another referral was placed to [facility] during her last hospitalization. She additionally is to continue to follow with her oral surgeon      #HEALTH MAINTENANCE  -PHQ-9 score: 22, GAD-7  score: 20 It does appear that she is following with psychiatry.   -Mammogram due 09/2023  -To continue to follow up with Endocrinology for Diabetes  -To continue to follow with Dermatology for her Psoariasis   -Colonoscopy due 09/2027  -Pap smear not indicated  -Consider obtaining DEXA scan prior to age 46      Return in about 3 months (around 08/08/2023) for Recheck.    Subjective:  Subjective    HPI: Ashley Briggs is a 57 y.o. female here for follow up     She is nausea constantly  and having abdominal bloating   She notes that she does have OSA (sleep study by ENT)     Phenergan will ease it back some of the nausea but she still gets quite nauseous.     Answers submitted by the patient for this visit:  Neurological Problem Questionnaire (Submitted on 05/06/2023)  Chief Complaint: Neurologic complaint  altered mental status: Yes  clumsiness: Yes  focal sensory loss: Yes  focal weakness: Yes  loss of balance: Yes  memory loss: Yes  near-syncope: Yes  slurred speech: No  syncope: No  visual change: Yes  weakness: Yes  Chronicity: recurrent  Onset: more than 1 month ago  Onset quality: suddenly  Progression since onset: gradually worsening  Focality: right-sided, lower extremity  abdominal pain: Yes  auditory change: No  aura: Yes  back pain: Yes  bladder incontinence: Yes  bowel incontinence: Yes  chest pain: No  confusion: Yes  diaphoresis: Yes  dizziness: Yes  fatigue: Yes  fever: No  headaches: Yes  light-headedness: Yes  nausea: Yes  neck pain: No  palpitations: No  shortness of breath: No  vertigo: Yes  vomiting: Yes  Treatments tried: acetaminophen, bed rest, sleep, walking  Improvement on treatment: no relief       ROS:  Review of systems negative unless otherwise noted as per HPI.    Objective:  Objective    Vitals:    05/08/23 1116   BP: 110/60   Pulse: 86   Temp: 37.1 ??C (98.7 ??F)   SpO2: 98%     Body mass index is 34.74 kg/m??.      Wt Readings from Last 6 Encounters:   05/08/23 91.8 kg (202 lb 6.4 oz)   04/19/23 98.2 kg (216 lb 6.4 oz)   03/24/23 91.6 kg (202 lb)   03/06/23 94 kg (207 lb 4.8 oz)   02/27/23 93.5 kg (206 lb 1.6 oz)   02/11/23 92.9 kg (204 lb 12.8 oz)       Physical Exam:  Const: in no acute distress, not toxic or ill appearing   HEENT: Wearing glasses. No nasal congestion present. She does still have some qhite plaques on her tongue  Cardiac: RRR  Lungs: CTAB, effort is normal. Abdomen: is soft, not grossly distended. Not tender to palpation on today's exam  MSK: Gait is stable today. She is not using walker with ambulation  Neuro: Aox3. CN II-XII intact. No neurodeficits on exam. No asterixis on exam. She does have mild   Skin: Psoriatic lesions on her abdomen are still quite improved from prior.   Psych: Mood is normal

## 2023-05-08 NOTE — Unmapped (Signed)
Glucerna

## 2023-05-10 DIAGNOSIS — R188 Other ascites: Principal | ICD-10-CM

## 2023-05-10 DIAGNOSIS — K7460 Unspecified cirrhosis of liver: Principal | ICD-10-CM

## 2023-05-14 ENCOUNTER — Encounter: Payer: Self-pay | Admitting: Internal Medicine

## 2023-05-14 DIAGNOSIS — G4733 Obstructive sleep apnea (adult) (pediatric): Secondary | ICD-10-CM

## 2023-05-14 NOTE — Telephone Encounter (Signed)
Sleep Study Results have been scanned into her chart.

## 2023-05-15 NOTE — Telephone Encounter (Signed)
Dr. Belia Heman, what settings would you like her CPAP to be on?

## 2023-05-18 NOTE — Addendum Note (Signed)
Addended by: Lajoyce Lauber A on: 05/18/2023 12:01 PM   Modules accepted: Orders

## 2023-05-18 NOTE — Telephone Encounter (Signed)
Order placed for cpap.  Patient aware.  Nothing further needed.

## 2023-05-18 NOTE — Telephone Encounter (Signed)
Dr. Belia Heman, please advise on cpap settings.

## 2023-05-20 MED FILL — XIFAXAN 550 MG TABLET: ORAL | 28 days supply | Qty: 56 | Fill #7

## 2023-05-20 MED FILL — COSENTYX PEN 300 MG/2 PENS (150 MG/ML) SUBCUTANEOUS: SUBCUTANEOUS | 28 days supply | Qty: 2 | Fill #5

## 2023-05-21 NOTE — Unmapped (Signed)
I followed up from a My Chart message from patient that said she was in severe abdominal pain and left a message that we were following up to see how she was feeling.    CCMA/ Misty Stanley

## 2023-05-22 NOTE — Unmapped (Signed)
She should contact her GI provider but also if her pain is severe again she needs to go back to the emergency room.

## 2023-05-22 NOTE — Unmapped (Signed)
Called and left voicemail letting her know that it is advised for her to contact her GI Provider with recent symptoms, and if pain was severe to please go to the Emergency Room. Left our contact number in case she has any further questions.

## 2023-05-27 DIAGNOSIS — R1013 Epigastric pain: Principal | ICD-10-CM

## 2023-05-27 DIAGNOSIS — Z794 Long term (current) use of insulin: Principal | ICD-10-CM

## 2023-05-27 DIAGNOSIS — E113212 Type 2 diabetes mellitus with mild nonproliferative diabetic retinopathy with macular edema, left eye: Principal | ICD-10-CM

## 2023-05-27 MED ORDER — CANAGLIFLOZIN 300 MG TABLET
ORAL_TABLET | Freq: Every day | ORAL | 0 refills | 90 days
Start: 2023-05-27 — End: 2024-05-26

## 2023-05-27 MED ORDER — PANTOPRAZOLE 40 MG TABLET,DELAYED RELEASE
ORAL_TABLET | Freq: Two times a day (BID) | ORAL | 1 refills | 90 days | Status: CP
Start: 2023-05-27 — End: 2024-05-26
  Filled 2023-05-28: qty 180, 90d supply, fill #0

## 2023-05-27 NOTE — Unmapped (Signed)
Patient is requesting the following refill  Requested Prescriptions     Pending Prescriptions Disp Refills    pantoprazole (PROTONIX) 40 MG tablet 180 tablet 1     Sig: Take 1 tablet (40 mg total) by mouth two (2) times a day.       Recent Visits  Date Type Provider Dept   05/08/23 Office Visit Mangel, Benison Pap, DO [facility] [address] [facility]   02/27/23 Office Visit Mangel, Benison Pap, DO [facility] [address] At [facility]   01/01/23 Office Visit Mangel, Benison Pap, DO [facility] [address] [facility]   11/04/22 Office Visit Mangel, Benison Pap, DO [facility] [address] [facility]   10/16/22 Office Visit Mangel, Benison Pap, DO [facility] [address] At [facility]   09/17/22 Office Visit Mangel, Benison Pap, DO [facility] [address] At [facility]   09/02/22 Office Visit Johnn Hai, Loleta Rose, FNP [facility] [address] [facility]   08/06/22 Office Visit Johnn Hai, Loleta Rose, FNP [place] [facility] [address] [facility]   Showing recent visits within past 365 days and meeting all other requirements  Future Appointments  Date Type Provider Dept   08/10/23 Appointment Mangel, Benison Pap, DO [facility] [address] [facility]   Showing future appointments within next 365 days and meeting all other requirements       Labs: Not applicable this refill

## 2023-05-28 MED FILL — INSULIN LISPRO (U-100) 100 UNIT/ML SUBCUTANEOUS PEN: SUBCUTANEOUS | 50 days supply | Qty: 15 | Fill #1

## 2023-05-28 MED FILL — ACCU-CHEK GUIDE TEST STRIPS: 50 days supply | Qty: 100 | Fill #1

## 2023-05-28 MED FILL — LACTULOSE 10 GRAM/15 ML ORAL SOLUTION: ORAL | 30 days supply | Qty: 2700 | Fill #1

## 2023-05-28 MED FILL — ULTICARE PEN NEEDLE 32 GAUGE X 1/4" (6 MM): SUBCUTANEOUS | 90 days supply | Qty: 100 | Fill #1

## 2023-05-29 ENCOUNTER — Other Ambulatory Visit: Payer: Self-pay | Admitting: Psychiatry

## 2023-06-02 ENCOUNTER — Ambulatory Visit: Admit: 2023-06-02 | Discharge: 2023-06-03 | Payer: PRIVATE HEALTH INSURANCE

## 2023-06-02 DIAGNOSIS — D320 Benign neoplasm of cerebral meninges: Principal | ICD-10-CM

## 2023-06-02 DIAGNOSIS — R519 Bilateral headache: Principal | ICD-10-CM

## 2023-06-02 LAB — SEDIMENTATION RATE: ERYTHROCYTE SEDIMENTATION RATE: >130 mm/h — ABNORMAL HIGH (ref 0–30)

## 2023-06-02 LAB — C-REACTIVE PROTEIN: C-REACTIVE PROTEIN: 20 mg/L — ABNORMAL HIGH (ref <=10.0)

## 2023-06-02 MED ORDER — GABAPENTIN 300 MG CAPSULE
ORAL_CAPSULE | Freq: Every evening | ORAL | 3 refills | 30 days | Status: CP
Start: 2023-06-02 — End: 2024-06-01

## 2023-06-02 NOTE — Unmapped (Signed)
[facility] Summary       [address]  [facility] [address] [place]  [address]  [place] [address]  [phone]    Date: 06/02/2023  Patient Name: Ashley Briggs  MRN: [id number]  PCP: Karie Georges Briggs  Mangel, Ashley Pap, DO            [name] is a 57 y.o. right handed female seen in consultation at the [place] [facility] at the request of Dr. Rosine Beat for evaluation.          Subjective:   Subjective            HPI: Patient is a 57 y.o. female seen at the request of Mangel, Ashley Pap, DO.       Diagnosis ICD-10-CM Associated Orders   1. Calcified cerebral meningioma (CMS-HCC)  D32.0 Reviewed the MRI revealing known Similar appearance of likely calcified meningioma along the planum sphenoidale.     No visual loss per patient. She tells me she had visual field testing in January and it was normal. Has started to develop nosebleeds.    No diplopia.         2. Bilateral headache  R51.9 These have started right before Xmas  Suffered the worst headache of her life [MRA head does not show any aneurysmal disease].     Since then the headaches have not improved. The pain is described as pressure all over her head. Sometimes gets piercing pain on temples. Associated nausea. Associated photophobia. These happen about a couple of times a week. She feels her temples hurt and tries to massage her scalp. However, there is pain all over her head, not limited to the temples.     Loud noises bother her, no pain when chewing food.     Usually suffered mild headaches occasionally that would break with tylenol.                         Past Medical Hx, Family Hx, Social Hx, and Problem List has been reviewed in the [employer].        REVIEW OF SYSTEMS:  ROS        Objective:       Physical Exam:  Blood pressure 154/68, pulse 86, temperature 36.6 ??C (97.8 ??F), resp. rate 18, height 162.6 cm (5' 4), weight 95.5 kg (210 lb 9 oz), last menstrual period 08/03/2006, SpO2 99%, not currently breastfeeding.       Eyes:      Extraocular Movements: EOM normal.      Pupils: Pupils are equal, round, and reactive to light.   Neurological:      Mental Status: Oriented to person, place, and time.   Psychiatric:         Speech: Speech normal.               Neurologic Exam     Mental Status   Oriented to person, place, and time.   Attention: normal. Concentration: normal.   Speech: speech is normal   Level of consciousness: alert  Knowledge: good.     Cranial Nerves     CN II   Right visual field deficit: none  Left visual field deficit: none     CN III, IV, VI   Pupils are equal, round, and reactive to light.  Extraocular motions are normal.  CN V   Facial sensation intact.     CN VII   Facial expression full, symmetric.     CN VIII   CN VIII normal.     CN IX, X   CN IX normal.     CN XI   CN XI normal.     CN XII   CN XII normal.       Focal exam today:  No temporal pain to palpation  No temporal artery induration  No visual field deficits to confrontation.                Assessment & Plan:   Assessment     Ashley Briggs is a 57 y.o. right handed female seen in consultation for the following problems.      Diagnosis ICD-10-CM Associated Orders   1. Calcified cerebral meningioma (CMS-HCC)  D32.0 Asymptomatic at this time  To see Neurosurgery      2. Bilateral headache  R51.9 Migraine characteristics  Not likely temporal arteritis, but will check her sed rate and crp today.   She does have polypharmacy due to cirrhosis and diabetes.     Currently taking coreg 6.25 mg twice a day, BP elevated pulse in the 80s. We could incresae to see if there is any improvement.     On welbutrin which pushes [place] away from TCAs.     Also a good candidate for gabapentin.     To start at gabapentin 300 mg nightly and increase as needed at next visit.         To see parker or gao in 6-8 weeks.       Thank you very much for this consultation. Please let us know if any questions or concerns.     Sincerely,  Lysbeth Galas, MD     CC: Mangel, Ashley Pap, DO

## 2023-06-02 NOTE — Unmapped (Signed)
Diagnosis ICD-10-CM Associated Orders   1. Calcified cerebral meningioma (CMS-HCC)  D32.0 Asymptomatic at this time  To see Neurosurgery      2. Bilateral headache  R51.9 Migraine characteristics  Not likely temporal arteritis, but will check her sed rate and crp today.   She does have polypharmacy due to cirrhosis and diabetes.     Currently taking coreg 6.25 mg twice a day, BP elevated pulse in the 80s. We could incresae to see if there is any improvement.     On welbutrin which pushes [place] away from TCAs.     Also a good candidate for gabapentin.     To start at 300 mg nightly and increase as needed at next visit.         To see parker or gao in 6-8 weeks.

## 2023-06-05 ENCOUNTER — Other Ambulatory Visit: Payer: Self-pay | Admitting: Psychiatry

## 2023-06-05 ENCOUNTER — Ambulatory Visit: Admit: 2023-06-05 | Discharge: 2023-06-06 | Payer: PRIVATE HEALTH INSURANCE

## 2023-06-05 DIAGNOSIS — S42201D Unspecified fracture of upper end of right humerus, subsequent encounter for fracture with routine healing: Principal | ICD-10-CM

## 2023-06-05 DIAGNOSIS — M7501 Adhesive capsulitis of right shoulder: Principal | ICD-10-CM

## 2023-06-05 DIAGNOSIS — G4489 Other headache syndrome: Principal | ICD-10-CM

## 2023-06-05 DIAGNOSIS — M25511 Pain in right shoulder: Principal | ICD-10-CM

## 2023-06-05 MED ORDER — BUPROPION HCL ER (XL) 150 MG PO TB24: 150.0000 mg | ORAL_TABLET | Freq: Every day | ORAL | 2 refills | Status: DC

## 2023-06-05 MED ORDER — ZOLPIDEM TARTRATE 5 MG PO TABS: 5.0000 mg | ORAL_TABLET | Freq: Every evening | ORAL | 0 refills | Status: DC | PRN

## 2023-06-05 MED ORDER — MIRTAZAPINE 7.5 MG PO TABS: 7.5000 mg | ORAL_TABLET | Freq: Every day | ORAL | 0 refills | Status: DC

## 2023-06-05 MED ADMIN — triamcinolone acetonide (KENALOG-40) injection 20 mg: 20 mg | INTRA_ARTICULAR | @ 14:00:00 | Stop: 2023-06-05

## 2023-06-05 NOTE — Unmapped (Signed)
Plan:    If your pain is no better in 2 weeks please let me know.  The next step would be an MRI      Large Volume injection for Adhesive Capsulitis    You had a large volume injection today. 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 Your shoulder was first numbed with lidocaine followed by an injection that included a large amount of sterile saline and a very small amount of cortisone, or steroid. The steroid we use is called Kenalog. The volume in the injection is meant to stretch out your shoulder capsule which is stuck down onto the joint structures limiting your motion and causing pain.     You received a corticosteroid injection to reduce pain and inflammation.  Please note that it can take up to 2 weeks for this injection to fully work.  While many people will feel relief sooner, please be patient.      The injection contained a corticosteroid and a numbing agent.  The numbing agent can last for 1-6 hours.  After this wears off you may have increased pain until the steroid has a chance to work.    What are some of the possible side effects of a steroid injection?    Common side effects:  temporarily elevated blood sugar (in diabetic patients) that can last a few days   flushing of the skin, especially the face  temporary rise in blood pressure  discoloration or atrophy of the skin at the injection site    Call your doctor at once if you have:  persistent worsening pain or swelling, fever;  blurred vision, tunnel vision, eye pain, or seeing halos around lights;  fast or slow heartbeats;  increased blood pressure that is associated with severe headache, blurred vision, pounding in your neck or ears, anxiety, nosebleed;  headaches, ringing in your ears, dizziness, nausea, vision problems, pain behind your eyes    This is not a complete list of side effects and others may occur. Call your doctor for medical advice about side effects.     What other drugs may be affected after the injection?  Many drugs can interact with steroids. Not all possible interactions are listed here. Tell your doctor about all your current medicines and any you start or stop using, especially:  an antibiotic or antifungal medication;  birth control pills or hormone replacement therapy;  a blood thinner (warfarin, Coumadin, and others);  a diuretic or water pill;  insulin or oral diabetes medicine;  medicine to treat tuberculosis;  a nonsteroidal anti-inflammatory drug or NSAID (aspirin, ibuprofen, naproxen, diclofenac, indomethacin, Advil, Aleve, Celebrex, and many others); or  seizure medication.          It is very important that you go to Physical therapy or do the exercises right away and regularly so that your shoulder does not lose the range of motion we were able to gain in clinic today. You may experience some soreness afterwards and this can last for 3-4 hours.      You can take Tylenol if you do have pain following the injection, but the most important goal is to keep your shoulder moving. This procedure is not an immediate fix but rather a ???jump start??? to getting better.  It is critical that you do your home exercises every day.          Thank you for coming to [facility] and our clinic today!     We aim  to provide you with the highest quality, individualized care.  If you have any unanswered questions after the visit, please do not hesitate to reach out to [place] on MyChart or leave a message for the nurse.  ?  MyChart messages: These messages can be sent to your provider and will be checked by their clinical support staff.? The messages are checked throughout the day during normal business hours from 8:30 am-4:00 pm Monday-Friday, however responses may take up to 48 hours.? Please use this method of communication for non-urgent and non-emergent concerns, questions, refill requests or inquiries only.? ?Our team will help respond to all of your questions.? Please note that you may be asked to see a provider by either a telehealth or in person visit if it is deemed your questions are best handled in the clinic setting in person.??  ?  Please keep in mind, these messages are not real time communications, so be patient when waiting for a response.    If you do not have access to MyChart, do not know how to use MyChart or have an issue that may require more extensive discussion, please call the nurses' call line: [phone] ([facility]) and [phone] ([place]).? This line is checked throughout the day and will be responded to as time allows.? Please note that return calls could take up to 48 hours, depending on the nature of the need.?  ?  If you have an issue that requires emergent attention that cannot wait; consider coming to our [facility] walk-in clinic, or go to the nearest Emergency Department.    If you need to schedule future appointments, please call [phone].     We look forward to seeing you again in the future and appreciate you choosing [place] [facility] for your care!    Thank you,    Lilla Shook, MD, FAAFP            We provide innovative and comprehensive patient centered care that is supported by evidence-based research                                                                                                    RESEARCH PARTICIPATION    Please check out our current research studies to see if you or someone you know may qualify at:    [email or web address]

## 2023-06-06 NOTE — Unmapped (Signed)
SPORTS MEDICINE RETURN VISIT    ASSESSMENT AND PLAN      Diagnosis ICD-10-CM Associated Orders   1. Closed fracture of proximal end of right humerus with routine healing, unspecified fracture morphology, subsequent encounter  S42.201D Lg Joint Inj: R glenohumeral      2. Adhesive capsulitis of right shoulder  M75.01 Lg Joint Inj: R glenohumeral      3. Pain in joint of right shoulder  M25.511 Lg Joint Inj: R glenohumeral           Limited range of motion of her right shoulder status post proximal humerus fracture, Now 73-month status post DOI.  limited motion is consistent with a traumatic adhesive capsulitis.  However, DDx also includes rotator cuff pathology, and fracture bisects the greater tuberosity.  Unable to fully characterize the rotator cuff today secondary to loss of range of motion limiting ability to maintain modified crass position as well as bony fragments obscuring visualization of the cuff.    We discussed the differential diagnosis of her potential loss of range of motion and pain, discussed the benefits and risks of injection versus confirmatory advanced imaging.  Given the duration for which she has had pain, she wants to pursue diagnostic and therapeutic LVI, which was completed under ultrasound guidance.  Tolerated well.  Aftercare return precautions given.    If she is not experience pain relief in the next 2 weeks, she will contact me and I will pursue MRI to better characterize her rotator cuff    No follow-ups on file.    Procedure(s):  Large Volume Glenohumeral Joint Injection with manipulation    Consent   After discussing the various treatment options for the condition, It was agreed that a large volume injection with manipulation would be the next appropriate step in treatment. The nature of and the indications for this  injection were reviewed in detail with the patient today. The inherent risks of injection including infection, allergic reaction, increased pain, incomplete relief or temporary relief of symptoms, alterations of blood glucose levels requiring careful monitoring and treatment as indicated, tendon, ligament or articular cartilage rupture or degeneration, nerve injury, skin depigmentation, and/or fatty atrophy were discussed.   Procedure   After the risks and benefits of the procedure were explained,verbal consent was given, and a procedural time-out was performed. The glenohumeral joint and surrounding structures were visualized with ultrasound and the glenohumeral joint space was identified  The site for the injection was properly marked and prepped with Chlorhexadine solution.  The injection site was anesthetized with ethyl chloride and 3cc of 1% Lidocaine with a 25 gauge 1.5 inch needle. Using ultrasound guidance, the glenohumeral joint was visualized and injected with a total of 25 cc of fluid including sterile water ropivacaine 20 milligrams of Kenalog, 4using a sterile technique and a 22 gauge 2.5 inch needle.  Capsular distention was visualized during the entire procedure.  During injection, there was unrestricted flow and care was taken not to inject corticosteroid into the skin or subcutaneous tissues. There were no complications during the procedure.      Post procedure a sterile band-aide was applied.  Then the patient was placed in a supine position we then did a manipulation in clinic in the forward flexion abduction internal and external rotation directions. The patient tolerated the manipulation well with only mild to moderate discomfort.  Post-injection instructions were given regarding post-procedure care, when to follow up in clinic and what to expect from the procedure. The patient tolerated  the injection well and was discharged without complication.    NEED FOR SONOGRAPHIC GUIDANCE    Given the complexity of this problem, the anatomic location of this structure, sonographic guidance is recommended to prevent injury to neurovascular structures and confirm accuracy of injection. The accuracy of doing these injections blind is poor and the benefit to the patient by using ultrasound guidance is significant to avoid complications.     Reference:  American Medical Society for Sports Medicine (AMSSM) position statement: interventional musculoskeletal ultrasound in sports medicine.  Morey Hummingbird MM, Adams E, Berkoff D, Concoff AL, Jiles Crocker J Sports Med. 2014 Oct 20. pii: [id number]. doi: [id number]        SUBJECTIVE     Chief Complaint:   Chief Complaint   Patient presents with    Right Shoulder - Pain, Injection(s)       History of Present Illness: 57 y.o. female who presents for evaluation for large-volume injection of her right shoulder following proximal humerus fracture 3 months ago.  Pain of the shoulder is improving but remains problematic.  Pain at times can radiate distally down the anterior aspect of her arm towards her elbow.  Grossly limited range of motion has not changed since she last saw Finis Bud in late May.  Interested in injection today for better pain control.    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 2 diabetes mellitus (CMS-HCC) 12/02/2022    Dry eye syndrome, bilateral 12/21/2020    Dysphagia 02/02/2023 Endometrial cancer (CMS-HCC) 2007    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    Liver disease     Major depression     Morbid obesity (CMS-HCC) 11/10/2019    Myopia of both eyes with astigmatism and presbyopia 12/02/2022    Nausea 09/22/2022    Non-alcoholic fatty liver disease     NPDR (nonproliferative diabetic retinopathy) (CMS-HCC)     Right eye    Other ascites 09/22/2022    Other chronic pain 11/06/2022    Other insomnia 11/06/2022    PFD (pelvic floor dysfunction) 04/07/2013  Portal hypertension (CMS-HCC) 10/08/2022    Psoriasis     Ptosis of eyelid, left     Left upper eyelid    Reflux     Retinopathy of left eye, background, proliferative 01/09/2021    Right upper quadrant abdominal pain 09/22/2022    Secondary esophageal varices without bleeding (CMS-HCC) 06/17/2022    Type 2 diabetes mellitus, with long-term current use of insulin (CMS-HCC) 12/12/2015    Last Assessment & Plan:   The current medical regimen is effective;  continue present plan and medications.  Patient will continue good management of diabetes especially with weight loss. Hopefully with further weight loss will run into the problem of being overmedicated and having to cut back on medications     Some confusion on diabetes medications was have not.been refilled for over a year.  Pa         OBJECTIVE     Physical Exam:  Vitals:   Wt Readings from Last 3 Encounters:   06/05/23 95.7 kg (210 lb 14.4 oz)   06/02/23 95.5 kg (210 lb 9 oz)   05/08/23 91.8 kg (202 lb 6.4 oz)     Estimated body mass index is 36.2 kg/m?? as calculated from the following:    Height as of this encounter: 162.6 cm (5' 4).    Weight as of this encounter: 95.7 kg (210 lb 14.4 oz).  Gen: Well-appearing female in no acute distress  MSK:   LEFT SHOULDER  Inspection/palpation: No swelling, erythema, deformity, atrophy or hypertrophy noted  Tenderness: anteriorly and posteriorly  Range of Motion: Fwd Elevation: 90; ABD: 90; IR: lateral glute; ER: 30; ABD/IR: Deferred; ABD/ER: Deferred  Strength: biceps: 5/5  abduction: 5-/5  external rotation with shoulder at side: 5/5  Provocative Tests:  Jobe/Empty Can negative Hawkins positive. O'Brien positive. Speed negative. Yergason negative.           Imaging/other tests: X-ray right shoulder 05/01/2023: Healing comminuted fracture of the proximal humerus.        ADMINISTRATIVE     I have personally reviewed and interpreted the images (as available).  Point-of-care ultrasound imaging is on file and stored in a permanent location (if performed).  I have personally reviewed prior records and incorporated relevant information above (as available).    MEDICAL DECISION MAKING (level of service defined by 2/3 elements)     Number/Complexity of Problems Addressed 1 acute complicated injury (99204/99214)   Amount/Complexity of Data to be Reviewed/Analyzed 3 points: Review prior notes (1 point per unique source); Review test results (1 point per unique test); Order tests (1 point per unique test); Assessment requiring an independent historian (99204/99214)   Risk of Complications/Morbidity/Mortality of Management Decision for MINOR Surgery (Including Injection) WITH Risk Factors (99204/99214)     TIME     Total Time for E/M Services on the Date of Encounter Time-based coding not utilized for this encounter     CONSULTATION     Consultation services provided No     MODIFIER 25 (Significant, Separately Billable Evaluation and Management)     Documentation to ensure appropriate insurance payment for medically necessary work    Per the [employer] (rev. 12/01/2021) Chapter 13, Section B. Evaluation & Management (E&M) Services, paragraph 5:   ???In general, E&M services on the same date of service as the minor surgical procedure are included in the payment for the procedure???However, a significant and separately identifiable E&M service unrelated to the decision to  perform the minor surgical procedure is separately reportable with modifier 25. The E&M service and minor surgical procedure do not require different diagnoses.???    Per the American Medical Association in Reporting CPT Modifier 25 [CPT??Geophysicist/field seismologist (Online). 2023;33(11):1-12.] Page 1, Appropriate Use, paragraph 1:   ???Modifier 25 is used to indicate that a patient's condition required a significant, separately identifiable evaluation and management (E/M) service above and beyond that associated with another procedure or service being reported by the same physician or other qualified health care professional [facility]) on the same date. This service must be above and beyond the other service provided or beyond the usual preoperative and postoperative care associated with the procedure or service that was performed on that same date, and it must be substantiated by documentation in the patient's record that satisfies the relevant criteria for the respective E/M service to be reported.???    Per the American Medical Association in Reporting CPT Modifier 25 [CPT??Geophysicist/field seismologist (Online). 2023;33(11):1-12.] Page 2, Considerations, bullet point 2 (Requires awareness of usual preoperative and postoperative services):   ???Pre- and post-operative services typically associated with a procedure include the following and cannot be reported with a separate E/M services code:   Review of patient's relevant past medical history,  Assessment of the problem area to be treated by surgical or other service,  Formulation and explanation of the clinical diagnosis,  Review and explanation of the procedure to the patient, family, or caregiver,  Discussion of alternative treatments or diagnostic options,  Obtaining informed consent,  Providing postoperative care instructions,  Discussion of any further treatment and follow up after the procedure???    As the service provider for this encounter, I attest that the patient's condition required a significant, separately identifiable, medical necessary evaluation and management service in addition to the procedure performed on the same date of service. The evaluation and management service was above and beyond the usual preoperative and postoperative care associated with the procedure. The specific elements of the encounter that represent evaluation and management service above and beyond the usual preoperative and postoperative care associated with the procedure include, but are not limited to:  Reviewed the patient's relevant past surgical history, social history and family history  Reviewed the patient's medications  Reviewed the patient's allergies  Independently obtained history of present illness and relevant review of systems  Independently reviewed laboratory, imaging and/or other data  Independently synthesized history, physical examination, laboratory, imaging and/or other data to formulate a management plan including elements separate from the procedure itself and usual postprocedural care       PROCEDURES     Lg Joint Inj: R glenohumeral on 06/05/2023 10:00 AM  Indications: pain  Details: ultrasound-guided  Laterality: right  Location: shoulder  Medications: 20 mg triamcinolone acetonide 40 mg/mL    Medical Care Team Attestation: All ProcDoc orders were read back and verbally confirmed with the procedure provider, including but not limited to patient name, medication name, dose, and route, before any actions were taken.  Provider Attestation: The information documented by members of my medical care team was reviewed and verified for accuracy by me.           DME     DME ORDER:  Dx:  ,

## 2023-06-08 ENCOUNTER — Ambulatory Visit: Admit: 2023-06-08 | Discharge: 2023-06-09 | Payer: PRIVATE HEALTH INSURANCE

## 2023-06-08 DIAGNOSIS — R519 Bilateral headache: Principal | ICD-10-CM

## 2023-06-08 DIAGNOSIS — D320 Benign neoplasm of cerebral meninges: Principal | ICD-10-CM

## 2023-06-08 NOTE — Unmapped (Signed)
She returns for follow-up regarding her small calcified meningioma along the planum.  She reports the same symptoms that were present previously, she is having headaches and dizziness and is currently under the care of a neurologist who is evaluating her for these.  From a neurosurgical standpoint she has not had any type of seizure activity visual change paresthesias or motor weakness.    She remains neurologically nonfocal    I reviewed her new MRI reveals no significant change in the small meningioma which is mainly mineralized.  There is no edema or FLAIR abnormality.    I discussed these findings with her, there are no concerns from my standpoint.  There would be no indication to intervene surgically.  I have recommended ongoing surveillance.  I have scheduled her for a new MRI in 1 year with return visit to see me.

## 2023-06-08 NOTE — Unmapped (Signed)
Return to see me in 1 year with your new MRI (June 2025)
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
## 2023-06-11 ENCOUNTER — Ambulatory Visit (INDEPENDENT_AMBULATORY_CARE_PROVIDER_SITE_OTHER): Payer: Medicaid Other | Admitting: Psychiatry

## 2023-06-11 ENCOUNTER — Encounter: Payer: Self-pay | Admitting: Psychiatry

## 2023-06-11 VITALS — BP 120/57 | HR 83 | Temp 97.6°F | Ht 64.0 in | Wt 201.4 lb

## 2023-06-11 DIAGNOSIS — G4700 Insomnia, unspecified: Secondary | ICD-10-CM

## 2023-06-11 DIAGNOSIS — F4010 Social phobia, unspecified: Secondary | ICD-10-CM | POA: Diagnosis not present

## 2023-06-11 DIAGNOSIS — F331 Major depressive disorder, recurrent, moderate: Secondary | ICD-10-CM | POA: Diagnosis not present

## 2023-06-11 MED ORDER — ZOLPIDEM TARTRATE 5 MG PO TABS: 5.0000 mg | ORAL_TABLET | Freq: Every evening | ORAL | 0 refills | Status: DC | PRN

## 2023-06-11 MED ORDER — MIRTAZAPINE 15 MG PO TABS: 15.0000 mg | ORAL_TABLET | Freq: Every day | ORAL | 0 refills | Status: DC

## 2023-06-12 DIAGNOSIS — M25511 Pain in right shoulder: Principal | ICD-10-CM

## 2023-06-15 ENCOUNTER — Ambulatory Visit: Admit: 2023-06-15 | Discharge: 2023-06-16 | Payer: PRIVATE HEALTH INSURANCE

## 2023-06-17 MED ORDER — GABAPENTIN 300 MG CAPSULE
ORAL_CAPSULE | Freq: Every evening | ORAL | 3 refills | 30 days | Status: CP
Start: 2023-06-17 — End: 2024-06-16

## 2023-06-17 NOTE — Unmapped (Signed)
Addended by: Purvis Sheffield on: 06/17/2023 11:58 AM     Modules accepted: Orders

## 2023-06-18 DIAGNOSIS — L409 Psoriasis, unspecified: Principal | ICD-10-CM

## 2023-06-18 MED ORDER — COSENTYX PEN 300 MG/2 PENS (150 MG/ML) SUBCUTANEOUS
SUBCUTANEOUS | 5 refills | 28.00000 days
Start: 2023-06-18 — End: ?

## 2023-06-18 NOTE — Unmapped (Signed)
The original prescription was discontinued on 06/02/2023 by Andreas Newport, Angela Cox, MD. Renewing this prescription may not be appropriate.

## 2023-06-19 MED ORDER — COSENTYX PEN 300 MG/2 PENS (150 MG/ML) SUBCUTANEOUS
SUBCUTANEOUS | 5 refills | 28.00000 days | Status: CP
Start: 2023-06-19 — End: ?
  Filled 2023-07-01: qty 2, 28d supply, fill #0

## 2023-06-24 DIAGNOSIS — E785 Hyperlipidemia, unspecified: Principal | ICD-10-CM

## 2023-06-24 DIAGNOSIS — R1013 Epigastric pain: Principal | ICD-10-CM

## 2023-06-24 MED ORDER — PANTOPRAZOLE 40 MG TABLET,DELAYED RELEASE
ORAL_TABLET | Freq: Two times a day (BID) | ORAL | 1 refills | 90 days | Status: CP
Start: 2023-06-24 — End: 2023-12-21

## 2023-06-24 MED ORDER — SIMVASTATIN 20 MG TABLET
ORAL | 0 refills | 90 days | Status: CP
Start: 2023-06-24 — End: 2024-06-23

## 2023-06-24 MED ORDER — PROMETHAZINE 12.5 MG TABLET
ORAL_TABLET | Freq: Three times a day (TID) | ORAL | 1 refills | 20 days | Status: CP | PRN
Start: 2023-06-24 — End: 2024-06-23

## 2023-06-24 NOTE — Unmapped (Signed)
Patient is requesting the following refill  Requested Prescriptions     Pending Prescriptions Disp Refills    pantoprazole (PROTONIX) 40 MG tablet 180 tablet 1     Sig: Take 1 tablet (40 mg total) by mouth two (2) times a day.    simvastatin (ZOCOR) 20 MG tablet 90 tablet 0     Sig: Take 1 tablet (20 mg total) by mouth every evening.    promethazine (PHENERGAN) 12.5 MG tablet 60 tablet 1     Sig: Take 1 tablet (12.5 mg total) by mouth every eight (8) hours as needed for nausea.       This patients last WCC/CPE date: Not Found     Recent Visits  Date Type Provider Dept   05/08/23 Office Visit Mangel, Benison Pap, DO [place] [facility] [address] [facility]   02/27/23 Office Visit Mangel, Benison Pap, DO [place] [facility] [address] [facility]   01/01/23 Office Visit Mangel, Benison Pap, DO [place] [facility] [address] [facility]   11/04/22 Office Visit Mangel, Benison Pap, DO [place] [facility] [address] [facility]   10/16/22 Office Visit Mangel, Benison Pap, DO [place] [facility] [address] [facility]   09/17/22 Office Visit Mangel, Benison Pap, DO [place] [facility] [address] [facility]   09/02/22 Office Visit Johnn Hai, Loleta Rose, FNP [place] [facility] [address] [facility]   08/06/22 Office Visit Johnn Hai, Loleta Rose, FNP [place] [facility] [address] [facility]   Showing recent visits within past 365 days and meeting all other requirements  Future Appointments  Date Type Provider Dept   08/10/23 Appointment Mangel, Benison Pap, DO  [facility] [address] At [facility]   Showing future appointments within next 365 days and meeting all other requirements       Last Weights:   Wt Readings from Last 3 Encounters:   06/08/23 91.9 kg (202 lb 8 oz)   06/05/23 95.7 kg (210 lb 14.4 oz)   06/02/23 95.5 kg (210 lb 9 oz)

## 2023-06-25 ENCOUNTER — Other Ambulatory Visit: Payer: Self-pay | Admitting: Psychiatry

## 2023-06-26 DIAGNOSIS — I8510 Secondary esophageal varices without bleeding: Principal | ICD-10-CM

## 2023-06-26 DIAGNOSIS — R601 Generalized edema: Principal | ICD-10-CM

## 2023-06-26 DIAGNOSIS — R188 Other ascites: Principal | ICD-10-CM

## 2023-06-26 DIAGNOSIS — K7682 Hepatic encephalopathy (CMS-HCC): Principal | ICD-10-CM

## 2023-06-26 DIAGNOSIS — K7460 Unspecified cirrhosis of liver: Principal | ICD-10-CM

## 2023-06-26 MED ORDER — SPIRONOLACTONE 100 MG TABLET
ORAL_TABLET | Freq: Two times a day (BID) | ORAL | 11 refills | 30 days | Status: CP
Start: 2023-06-26 — End: ?

## 2023-06-26 MED ORDER — RIFAXIMIN 550 MG TABLET
ORAL_TABLET | Freq: Two times a day (BID) | ORAL | 11 refills | 30 days | Status: CP
Start: 2023-06-26 — End: ?

## 2023-06-26 MED ORDER — FUROSEMIDE 40 MG TABLET
ORAL_TABLET | Freq: Two times a day (BID) | ORAL | 11 refills | 30 days | Status: CP
Start: 2023-06-26 — End: ?

## 2023-06-26 MED ORDER — CARVEDILOL 6.25 MG TABLET
ORAL_TABLET | Freq: Two times a day (BID) | ORAL | 11 refills | 30 days | Status: CP
Start: 2023-06-26 — End: ?

## 2023-06-29 NOTE — Unmapped (Signed)
[facility] Refill Coordination Note    Specialty Medication(s) to be Shipped:   Inflammatory Disorders: Cosentyx    Other medication(s) to be shipped: No additional medications requested for fill at this time     Ashley Briggs, DOB: [birth year]  Phone: [phone] (home)       All above HIPAA information was verified with patient.     Was a Nurse, learning disability used for this call? No    Completed refill call assessment today to schedule patient's medication shipment from the [facility] [phone]).  All relevant notes have been reviewed.     Specialty medication(s) and dose(s) confirmed: Regimen is correct and unchanged.   Changes to medications: Ashley Briggs reports starting the following medications: Gabapentin   Changes to insurance: No  New side effects reported not previously addressed with a pharmacist or physician: None reported  Questions for the pharmacist: No    Confirmed patient received a Conservation officer, historic buildings and a Surveyor, mining with first shipment. The patient will receive a drug information handout for each medication shipped and additional FDA Medication Guides as required.       DISEASE/MEDICATION-SPECIFIC INFORMATION        For patients on injectable medications: Patient currently has 0 doses left.  Next injection is scheduled for 06/30/2023.    SPECIALTY MEDICATION ADHERENCE     Medication Adherence    Patient reported X missed doses in the last month: 0  Specialty Medication: COSENTYX PEN (2 PENS) 150 mg/mL Pnij injection (secukinumab)  Patient is on additional specialty medications: No  Patient is on more than two specialty medications: No              Were doses missed due to medication being on hold? No      COSENTYX PEN (2 PENS) 150 mg/mL Pnij injection (secukinumab): 0 days of medicine on hand       REFERRAL TO PHARMACIST     Referral to the pharmacist: Not needed      [facility]     Shipping address confirmed in Epic.       Delivery Scheduled: Yes, Expected medication delivery date: 07/01/2023.     Medication will be delivered via Same Day Courier to the prescription address in Epic [place].    Ashley Briggs J Helane Gunther   [facility] Specialty Technician

## 2023-07-09 NOTE — Unmapped (Unsigned)
[facility] [employer] [facility] ([facility])  [facility] Faculty Consultation Visit Note         REFERRING PHYSICIAN:    Kennedy Bucker, MD  [address]  [place],  [place] [address]    PRIMARY CARE PROVIDER:  Mangel, Benison Pap, DO  Cc:  Kateri Plummer, MD          ASSESSMENT:   This is a 57 y.o. year old female with ***    PLAN:  ***  1.  2.   3. Further recommendations to be made pending response/results of above interventions.  Patient is to reach out to me in the interim with questions/concerns and has my contact information to do so.    [ This dictation was done with [facility] Naturally Speaking. Document was reviewed but inadvertent errors in transcription may be present ]          CHIEF COMPLAINT:   Ashley Briggs is a 57 y.o. female (DOB:  1966-06-16) who is seen in consultation at the request of Dr Eudelia Bunch for Referral to CED[facility] provider for reported difficulty swallowing, and question of possible lichen planus of esophagus.    HISTORY OF PRESENT ILLNESS:  ***    Patient reports increasing frequency of choking on her food intake and secretions, reports this has been ongoing but slightly increased since her EGD in 01/2023.     EGD 01/22/23:  Grade II esophageal varices, portal hypertension gastropathy, normal proximal esophagus - bx'ed to r/o lichen planus (newly diagnosed on patient's tongue).  Path results:  Proximal esophagus, biopsy  - Fragments of esophageal squamous epithelium with increased intraepithelial lymphocytic infiltrate, nonspecific; cannot exclude lichenoid esophagitis in the appropriate clinical setting

## 2023-07-13 DIAGNOSIS — U071 COVID-19: Principal | ICD-10-CM

## 2023-07-13 MED ORDER — MOLNUPIRAVIR 200 MG CAPSULE (EUA)
ORAL_CAPSULE | Freq: Two times a day (BID) | ORAL | 0 refills | 5 days | Status: CP
Start: 2023-07-13 — End: 2023-07-18

## 2023-07-13 NOTE — Unmapped (Signed)
Regarding: [place]: dizziness and COVID sx's  ----- Message from Michele Mcalpine sent at 07/13/2023 10:39 AM EDT -----  COVID + 07/11/2023 - c/o of dizziness, cough, fever (was 102.175F and 98.75F this AM), headache, chills, sore throat    No openings for her close by till tomorrow - will go to urgent care

## 2023-07-13 NOTE — Unmapped (Signed)
Reason for Disposition  • Message left on identified voice mail    Answer Assessment - Initial Assessment Questions  N/A    Protocols used: No Contact or Duplicate Contact Call-A-AH

## 2023-07-26 ENCOUNTER — Other Ambulatory Visit: Payer: Self-pay | Admitting: Psychiatry

## 2023-07-27 MED ORDER — PEN NEEDLE, DIABETIC 32 GAUGE X 1/4" (6 MM)
Freq: Every evening | SUBCUTANEOUS | 1 refills | 100 days
Start: 2023-07-27 — End: 2024-07-26

## 2023-07-27 NOTE — Unmapped (Signed)
[facility] Refill Coordination Note    Ashley Briggs, [name]: 04-Dec-1965  Phone: [phone] (home)       All above HIPAA information was verified with patient.         07/26/2023    12:45 PM   Specialty Rx Medication Refill Questionnaire   Which Medications would you like refilled and shipped? Cosyntex, Needle Tips   Please list all current allergies: None   Have you missed any doses in the last 30 days? No   Have you had any changes to your medication(s) since your last refill? No   How many days remaining of each medication do you have at home? 4   If receiving an injectable medication, next injection date is 07/30/2023   Have you experienced any side effects in the last 30 days? No   Please enter the full address (street address, city, state, zip code) where you would like your medication(s) to be delivered to. [address] , [address], [place],  [place] [address]   Please specify on which day you would like your medication(s) to arrive. Note: if you need your medication(s) within 3 days, please call the pharmacy to schedule your order at [phone]  07/29/2023   Has your insurance changed since your last refill? No   Would you like a pharmacist to call you to discuss your medication(s)? No   Do you require a signature for your package? (Note: if we are billing Medicare Part B or your order contains a controlled substance, we will require a signature) No   Additional Comments: I have other meda I need to order         Completed refill call assessment today to schedule patient's medication shipment from the [facility] [phone]).  All relevant notes have been reviewed.       Confirmed patient received a Conservation officer, historic buildings and a Surveyor, mining with first shipment. The patient will receive a drug information handout for each medication shipped and additional FDA Medication Guides as required.         REFERRAL TO PHARMACIST     Referral to the pharmacist: Not needed      [facility]     Shipping address confirmed in Epic.     Delivery Scheduled: Yes, Expected medication delivery date: 8/28.     Medication will be delivered via Same Day Courier to the prescription address in Epic [place].    [name]   [facility] Specialty Technician
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
## 2023-07-28 ENCOUNTER — Other Ambulatory Visit: Payer: Self-pay | Admitting: Psychiatry

## 2023-07-28 MED ORDER — PEN NEEDLE, DIABETIC 32 GAUGE X 1/4" (6 MM)
Freq: Every evening | SUBCUTANEOUS | 1 refills | 100 days
Start: 2023-07-28 — End: 2024-07-27

## 2023-07-29 ENCOUNTER — Ambulatory Visit: Payer: Medicaid Other | Admitting: Internal Medicine

## 2023-07-29 ENCOUNTER — Ambulatory Visit: Admit: 2023-07-29 | Discharge: 2023-07-30 | Payer: PRIVATE HEALTH INSURANCE

## 2023-07-29 DIAGNOSIS — R2689 Other abnormalities of gait and mobility: Principal | ICD-10-CM

## 2023-07-29 DIAGNOSIS — G44221 Chronic tension-type headache, intractable: Principal | ICD-10-CM

## 2023-07-29 MED ORDER — BACLOFEN 5 MG TABLET
ORAL_TABLET | Freq: Every evening | ORAL | 11 refills | 30 days | Status: CP
Start: 2023-07-29 — End: 2024-07-28

## 2023-07-29 MED ORDER — PEN NEEDLE, DIABETIC 32 GAUGE X 1/4" (6 MM)
SUBCUTANEOUS | 1 refills | 100 days | Status: CP
Start: 2023-07-29 — End: 2024-07-28
  Filled 2023-08-20: qty 10, 10d supply, fill #0

## 2023-07-29 MED FILL — COSENTYX PEN 300 MG/2 PENS (150 MG/ML) SUBCUTANEOUS: SUBCUTANEOUS | 28 days supply | Qty: 2 | Fill #1

## 2023-07-29 NOTE — Unmapped (Addendum)
It was a pleasure to see you today.     Talk with your endocrinologist about a steroid for headache. This would start at 50mg  daily, then the next day 40mg , then 30mg  then 20mg  then 10mg  then stop.     Start Baclofen 5mg  nightly.     PT will call you to schedule physical therapy for balance.     Follow up: Return in about 4 weeks (around 08/26/2023) for In-person.     If you have any questions in between visits, please send a message via MyChart or you can call our office. MyChart and the Clinic Triage are for non-urgent medical questions. If you are experiencing a medical emergency, please proceed to the closest Emergency Department.     Viviano Simas MSN, APRN, AGPCNP-BC  Nurse Practitioner, General Neurology  [facility] Department of Neurology  Phone: [phone]  Fax: [phone]

## 2023-07-29 NOTE — Unmapped (Cosign Needed)
[facility] Neurology Clinic Summary       [address]  [facility] [address] [place]  [address]  [place] [address]  [phone]    Date: 07/29/2023  Patient Name: Ashley Briggs  MRN: [id number]  PCP: Karie Georges Pap  Mangel, Benison Pap, DO            Ashley Briggs is a 57 y.o.  female seen at the [place] [facility] for follow up.     Assessment & Plan:   Assessment     Ashley Briggs is a 57 y.o.  female seen for the following problems:     Diagnosis ICD-10-CM Associated Orders   1. Chronic tension-type headache, intractable  G44.221 baclofen (LIORESAL) 5 mg Tab tablet      2. Imbalance  R26.89 Ambulatory referral to Physical Therapy        Headache: Patient with chronic headache, noting headache is daily.  This is at her whole head, and is intractable.  This is rated 8-10/10 and is described as pressure.  She notes that the pressure feeling will build up to a point where it feels like her head is going to explode.  There is no aura.  There is no light or sound sensitivity.  There is some smell sensitivity with strong food smells.  There is nausea with occasional vomiting.  There are no vision changes, tinnitus, autonomic signs.  Nothing is relieving or aggravating.  She reports 30/30 headache days per month with 20/30 that are debilitating.  She is currently taking gabapentin.  She has tried and failed Tylenol.  She does note a superimposed headache that can come and go.  This is described as a very painful spot in 1 area of her head, which can vary.  This lasts for about an hour.    DDX:  Chronic tension type headache given pain is described as pressure and is without migrainous features.  Possible superimposed migraine given other headache can come and go and is more intense.    PLAN:  Given history of DM, will have patient talk with endocrine team about a steroid for status migrainosus. If oral steroid is not safe given hyperglycemia risk, can consider ED for IV migraine cocktail with BG monitoring.   Start baclofen 5mg  nightly for suspected tension component of headache.      Imbalance: Patient with reports of imbalance today, noting that she will list to 1 side while walking.  She denies any overt dizzy or spinning sensations.  She denies any sensory loss in her feet.  She has this checked regularly given her history of undercontrolled diabetes.  On exam, sensation to light touch, pinprick, vibration is intact.  Gait is normal and fluid with full strength throughout.  Romberg is negative.  Reflexes are 1+ throughout.  Tandem walk is abnormal.    DDX:  Possible BPPV but this is less likely given lack of spinning sensation.  Possible early large fiber neuropathy, but this is less likely given lack of sensory findings on exam.    PLAN:  Physical therapy for balance training.  Given lack of significant findings on exam, do not recommend EMG at this time.  However should sensory findings present themselves in a future visit, would have low threshold for obtaining this test.      Follow Up:  1 months    Thank you very much for  this consultation.    Sincerely,  Viviano Simas NP    Note forwarded to Dr. Lia Foyer for review.     CC: Mangel, Benison Pap, DO         I personally spent 37 minutes face-to-face and non-face-to-face in the care of this patient, which includes all pre, intra, and post visit time on the date of service.  All documented time was specific to the E/M visit and does not include any procedures that may have been performed.     Portions of this record have been created using Scientist, clinical (histocompatibility and immunogenetics). Dictation errors have been sought, but may not have been identified and corrected.      Subjective:   Subjective        Obtained history from the patient      HPI: Patient is a 57 y.o. female seen at the request of Mangel, Benison Pap, DO. PMH notable for DM, HLD, HTN, cataract, retinopathy, CAD, hepatic steatosis, hepatic cirrhosis, secondary esophageal varices, OSA, headache, here today for follow up on headache.     Still daily. Still having pressure build up, feeling like it's going to explode. Will have occasional very painful spot on head (area varies) that lasts about an hour. Headache is never gone.     Notes some imbalance as well, listing to one side. No dizziness or spinning. No sensory loss in feet per patient.     HA 1 - located whole head, intractable  Pain 8-10/10, described as pressure  Aura: No  Photophobia: No  Phonophobia: No  Osmophobia: Yes, describe: food smells  Nausea/Vomiting: Yes, describe: with occasional vomiting  Vision changes: No  Unilateral tearing, facial flushing, ptosis, weakness, paresthesia: No  Tinnitus: No  Relieving factors No  Aggravating factors: No     Current HA 1 days/month: 30  HA 1 days that are debilitating: 20    Current Medications:   gabapentin    Failed Medications:  Tylenol  Gabapentin        Past Medical Hx, Family Hx, Social Hx, and Problem List has been reviewed in the [employer].        REVIEW OF SYSTEMS: pertinent items noted in HPI          Objective:       Physical Exam:  Blood pressure 117/69, pulse 89, height 162.6 cm (5' 4), weight 92.5 kg (204 lb), last menstrual period 08/03/2006, not currently breastfeeding.       Constitutional:       Appearance: Normal appearance. Well-developed and well-groomed.   Eyes:      Extraocular Movements: EOM normal.      Pupils: Pupils are equal, round, and reactive to light.   Neurological:      Mental Status: Oriented to person, place, and time.      Coordination: Romberg Test normal.      Gait: Tandem walk abnormal.      Deep Tendon Reflexes:      Reflex Scores:       Tricep reflexes are 1+ on the right side and 1+ on the left side.       Bicep reflexes are 1+ on the right side and 1+ on the left side.       Brachioradialis reflexes are 1+ on the right side and 1+ on the left side.       Patellar reflexes are 1+ on the right side and 1+ on the left side.  Psychiatric:  Attention and Perception: Attention normal.         Mood and Affect: Mood and affect normal.         Speech: Speech normal.         Behavior: Behavior is cooperative.         Thought Content: Thought content normal.               Neurologic Exam     Mental Status   Oriented to person, place, and time.   Attention: normal. Concentration: normal.   Speech: speech is normal   Level of consciousness: alert  Knowledge: good.     Cranial Nerves     CN II   Visual fields full to confrontation.     CN III, IV, VI   Pupils are equal, round, and reactive to light.  Extraocular motions are normal.   Right pupil: Consensual response: intact. Accommodation: intact.   Left pupil: Consensual response: intact. Accommodation: intact.   CN III: no CN III palsy  CN VI: no CN VI palsy  Nystagmus: none     CN V   Facial sensation intact.     CN VII   Facial expression full, symmetric.     CN VIII   CN VIII normal.     CN IX, X   CN IX normal.   CN X normal.     CN XI   CN XI normal.     CN XII   CN XII normal.     Motor Exam   Right arm tone: normal  Left arm tone: normal  Right leg tone: normal  Left leg tone: normal    Strength   Right deltoid: 5/5  Left deltoid: 5/5  Right biceps: 5/5  Left biceps: 5/5  Right triceps: 5/5  Left triceps: 5/5  Right wrist flexion: 5/5  Left wrist flexion: 5/5  Right wrist extension: 5/5  Left wrist extension: 5/5  Right interossei: 5/5  Left interossei: 5/5  Right iliopsoas: 5/5  Left iliopsoas: 5/5  Right quadriceps: 5/5  Left quadriceps: 5/5  Right hamstring: 5/5  Left hamstring: 5/5  Right anterior tibial: 5/5  Left anterior tibial: 5/5  Right gastroc: 5/5  Left gastroc: 5/5    Sensory Exam   Right arm light touch: normal  Left arm light touch: normal  Right leg light touch: normal  Left leg light touch: normal  Right arm vibration: normal  Left arm vibration: normal  Right leg vibration: normal  Left leg vibration: normal  Right arm pinprick: normal  Left arm pinprick: normal  Right leg pinprick: normal  Left leg pinprick: normal    Gait, Coordination, and Reflexes     Gait  Gait: (gait normal)    Coordination   Romberg: negative  Tandem walking coordination: abnormal    Tremor   Resting tremor: absent    Reflexes   Right brachioradialis: 1+  Left brachioradialis: 1+  Right biceps: 1+  Left biceps: 1+  Right triceps: 1+  Left triceps: 1+  Right patellar: 1+  Left patellar: 1+             Diagnostic Studies and Review of Records:     MRI Brain with and without contrast 05/2023:  Impression  No acute intracranial abnormality. Mild cerebral atrophy.  Similar appearance of likely calcified meningioma along the planum sphenoidale.    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  Left: No evidence of temporal arteritis.

## 2023-07-30 ENCOUNTER — Ambulatory Visit: Payer: Medicaid Other | Admitting: Psychiatry

## 2023-07-31 ENCOUNTER — Ambulatory Visit: Payer: Medicaid Other | Admitting: Internal Medicine

## 2023-07-31 ENCOUNTER — Other Ambulatory Visit: Payer: Self-pay | Admitting: Psychiatry
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
## 2023-08-01 NOTE — Telephone Encounter (Signed)
 The refill has been ordered as requested. Please contact the patient to schedule a follow-up visit.

## 2023-08-04 NOTE — Unmapped (Signed)
LVM to sch endo appt with Dr Amalia Hailey, can do virtual
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
## 2023-08-05 MED ORDER — CANAGLIFLOZIN 300 MG TABLET
ORAL_TABLET | Freq: Every day | ORAL | 90 days
Start: 2023-08-05 — End: 2024-08-04

## 2023-08-05 MED ORDER — METFORMIN ER 500 MG TABLET,EXTENDED RELEASE 24 HR
ORAL_TABLET | Freq: Two times a day (BID) | ORAL | 90 days
Start: 2023-08-05 — End: 2024-08-04

## 2023-08-05 MED ORDER — LANTUS SOLOSTAR U-100 INSULIN 100 UNIT/ML (3 ML) SUBCUTANEOUS PEN
Freq: Every evening | SUBCUTANEOUS | 1 refills | 30 days
Start: 2023-08-05 — End: 2023-09-04

## 2023-08-05 NOTE — Unmapped (Signed)
Front desk, please contact patient per Dr Amalia Hailey for virtual visit.    Dr. Amalia Hailey, Lantus was not sent to pharmacy, the other diabetic medications were original filled by PCP, and noted you would be taken them over. All requested medications have been pend to correct pharmacy and your approval.

## 2023-08-05 NOTE — Unmapped (Signed)
Addended by: Jonnie Kind on: 08/05/2023 03:48 PM     Modules accepted: Orders

## 2023-08-05 NOTE — Unmapped (Signed)
Left VM to call and schedule

## 2023-08-07 MED ORDER — METFORMIN ER 500 MG TABLET,EXTENDED RELEASE 24 HR
ORAL_TABLET | Freq: Two times a day (BID) | ORAL | 0 refills | 90 days | Status: CP
Start: 2023-08-07 — End: 2024-08-06

## 2023-08-07 MED ORDER — CANAGLIFLOZIN 300 MG TABLET
ORAL_TABLET | Freq: Every day | ORAL | 0 refills | 90 days | Status: CP
Start: 2023-08-07 — End: 2024-08-06

## 2023-08-07 MED ORDER — LANTUS SOLOSTAR U-100 INSULIN 100 UNIT/ML (3 ML) SUBCUTANEOUS PEN
Freq: Every evening | SUBCUTANEOUS | 0 refills | 30 days | Status: CP
Start: 2023-08-07 — End: 2023-09-06

## 2023-08-08 NOTE — Progress Notes (Signed)
No show

## 2023-08-10 NOTE — Unmapped (Unsigned)
ENDOCRINE/DIABETES TELEVISIT (VIDEO VISIT)    {    Coding tips - Do not edit this text, it will delete upon signing of note!    Telephone visits [phone] for Physicians and APPs and [phone] for Non- Physician Clinicians)- Only use minutes on the phone to determine level of service.    Video visits [phone]) - Use either level of medical decision making just as an in-person visit OR time which includes both minutes on video and pre/post minutes to determine the level of service.      :[id number]}  The patient reports they are physically located in [place] and is currently: {patient location:[address]}. I conducted a {phone audio video visit:101857} .         Endocrinology F/U  Assessment:    Ashley Briggs is a 57 y.o. female who is seen in consultation  for   Encounter Diagnoses   Name Primary?    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, with long-term current use of insulin (CMS-HCC) Yes    Hyperlipidemia, unspecified hyperlipidemia type     Coronary artery disease involving native heart without angina pectoris, unspecified vessel or lesion type     Hypertension, unspecified type     Hepatic encephalopathy (CMS-HCC)     Hepatic cirrhosis, unspecified hepatic cirrhosis type, unspecified whether ascites present (CMS-HCC)     H/O diabetic gastroparesis     Secondary esophageal varices without bleeding (CMS-HCC)     Anasarca     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    1. 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 Hyperlipidemia, unspecified hyperlipidemia type    3. Coronary artery disease involving native heart without angina pectoris, unspecified vessel or lesion type    4. Hypertension, unspecified type    5. Hepatic encephalopathy (CMS-HCC)    6. Hepatic cirrhosis, unspecified hepatic cirrhosis type, unspecified whether ascites present (CMS-HCC)    7. H/O diabetic gastroparesis    8. Secondary esophageal varices without bleeding (CMS-HCC)    9. Anasarca    10. 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 Patient reports that her morning sugars are above 120. Previous A1c was 8.4 and since patient has been without some of her medications for 1-3 weeks and has not been seen in six months, her A1c is most likely still elevated, if not higher.     Patient reports of persistent nausea. Nausea is most likely due to the Trulicity.     Plan:  -Start wearing Dexcom 7 CGM. Once I have more blood sugar data, I plan to further adjust her medications.   -Reduce Trulicity to 1.5 mg once weekly and metformin to 1000 mg every morning. Hopefully nausea will resolve on a lower dose.   -Advised patient to only take 10 units of Humalog if she  is eating a full meal. For small meals, she should at least reduce units by half (5 units).  -Continue Lantus 66 units daily and Invokana 300 mg daily.   -Instructed patient to not proceed with steroid treatment since her blood sugars are not controlled. Steroids would make her glucose dangerously high.   -Follow up in 3 weeks with pre-visit labs. I plan to better adjust her medications.     Lab Results   Component Value Date    A1C 8.4 (H) 02/11/2023    A1C 7.8 (A) 11/04/2022    A1C 9.8 (A) 09/02/2022    GLU 198 (H) 05/08/2023    GLU 93 04/19/2023    GLU 84 04/18/2023    CREATININE 0.56 05/08/2023    CREATININE 0.52 (L) 04/19/2023    CREATININE 0.54 (L) 04/18/2023    ALBCRERAT 7.7 02/11/2023    ALBCRERAT  02/11/2022      Comment:      Unable to calculate.    ALBCRERAT 4.9 11/27/2020      Orders Placed This Encounter   Procedures    Hemoglobin A1c    Albumin/creatinine urine ratio    Comprehensive Metabolic Panel    Lactate, Venous, Whole Blood    CBC w/ Differential           Return in about 3 weeks (around 09/01/2023) for In-person.  Subjective:        HPI:    Ashley Briggs is a 57 y.o. female who is seen in consultation  for   Encounter Diagnoses   Name Primary?    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, with long-term current use of insulin (CMS-HCC) Yes    Hyperlipidemia, unspecified hyperlipidemia type     Coronary artery disease involving native heart without angina pectoris, unspecified vessel or lesion type     Hypertension, unspecified type     Hepatic encephalopathy (CMS-HCC)     Hepatic cirrhosis, unspecified hepatic cirrhosis type, unspecified whether ascites present (CMS-HCC)     H/O diabetic gastroparesis     Secondary esophageal varices without bleeding (CMS-HCC)     Anasarca     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    Last visit was 02/11/23. At this time, I started patient on 10 units of short acting insulin insulin because of numerous hyperglycemic episodes. A1c was 8.4.     Diabetes medication regimen consists of Trulicity 3 mg weekly, metformin 1000 mg twice daily, Invokana 300 mg daily, Humalog 10 units before meals (usually at noon and 9 PM), and Lantus 66 units every night. However, patient shares that she has been out of metformin and Invokana for a week and Trulicity for three weeks.    Patient reports that her blood sugars have mostly been high. Yesterday's fasting sugar was 174. The previous evening was above 200. One of her blood sugars was too high to register so she almost went to the ED, however her sugars slowly came down. She had one instance of a low (51) two weeks ago after taking Humalog. Unsure why she went low but no instances of lows the following days.  Patient experiences persistent nausea which is most likely caused by the Trulicity. No vomiting. Endorses that she has recently been feeling very sleepy and confused at times. Memory deficits are not a new symptom and has not worsened. Planning on receiving a steroid treatment for headaches however she was advised to wait until she consulted with endo today.      ROS: No CP, SOB, DOE, swelling, paresthesias, recent vision changes, polyuria, or polydipsia, or skin/feet problems.       I have reviewed past medical/surgical, medication, allergy, social and family histories today and updated them in Epic or further in this note where appropriate.    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 2 diabetes mellitus (CMS-HCC) (12/02/2022), Dry eye syndrome, bilateral (12/21/2020), Dysphagia (02/02/2023), Endometrial cancer (CMS-HCC) (2007), Esophageal varices in cirrhosis (CMS-HCC) (06/17/2022), Gastroparesis, Generalized pruritus (02/02/2023), H/O diabetic gastroparesis (01/09/2021), Heart disease, Hemorrhage of rectum and anus (03/17/2011), Hepatic cirrhosis (CMS-HCC) (06/17/2022), Hepatic encephalopathy (CMS-HCC) (06/17/2022), Hepatic steatosis (03/13/2021), High cholesterol, psoriasis (08/06/2022), Hyperlipidemia (12/12/2015), Hypertension, 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), Liver disease, Major depression, Morbid obesity (CMS-HCC) (11/10/2019), Myopia of both eyes with astigmatism and presbyopia (12/02/2022), Nausea (09/22/2022), Non-alcoholic fatty liver disease, NPDR (nonproliferative diabetic retinopathy) (CMS-HCC), Other ascites (09/22/2022), Other chronic pain (11/06/2022), Other insomnia (11/06/2022), PFD (pelvic floor dysfunction) (04/07/2013), Portal hypertension (CMS-HCC) (10/08/2022), Psoriasis, Ptosis of eyelid, left, Reflux, Retinopathy of left eye, background, proliferative (01/09/2021), Right upper quadrant abdominal pain (09/22/2022), Secondary esophageal varices without bleeding (CMS-HCC) (06/17/2022), and Type 2 diabetes mellitus, with long-term current use of insulin (CMS-HCC) (12/12/2015).      reports that she has never smoked. She has been exposed to tobacco smoke. She has never used smokeless tobacco. She reports that she does not currently use alcohol. She reports that she does not currently use drugs.    Nikole Sheleen Conchas family history includes Cancer in her maternal grandfather and mother; Diabetes in her maternal grandfather, maternal grandmother, and mother; Hypertension in her maternal grandmother; No Known Problems in her brother, father, maternal aunt, maternal uncle, paternal aunt, paternal grandmother, paternal uncle, sister, and another family member; Skin cancer in her paternal grandfather; Stroke in her paternal grandfather.      Current Outpatient Medications:     acetaminophen (TYLENOL) 325 MG tablet, Take 2 tablets (650 mg total) by mouth two (2) times a day as needed for pain., Disp: 180 tablet, Rfl: 1    aspirin (ECOTRIN) 81 MG tablet, Take 1 tablet (81 mg total) by mouth daily., Disp: 90 tablet, Rfl: 3    baclofen (LIORESAL) 5 mg Tab tablet, Take 1 tablet (5 mg total) by mouth nightly., Disp: 30 tablet, Rfl: 11    blood sugar diagnostic Strp, Test two (2) times a day (30 minutes before a meal)., Disp: 100 strip, Rfl: 1    blood-glucose meter,continuous (DEXCOM G7 RECEIVER) Misc, Use as instructed with G7 sensors, Disp: 1 each, Rfl: 1    blood-glucose sensor (DEXCOM G7 SENSOR) Devi, Dispense DexCom G7 sensors; Use 1 sensor q 10 days, Disp: 9 each, Rfl: 3    buPROPion (WELLBUTRIN XL) 150 MG 24 hr tablet, Take 1 tablet (150  mg total) by mouth daily., Disp: , Rfl:     canagliflozin (INVOKANA) 300 mg Tab tablet, Take 1 tablet (300 mg total) by mouth daily before breakfast., Disp: 90 tablet, Rfl: 0    carvedilol (COREG) 6.25 MG tablet, Take 1 tablet (6.25 mg total) by mouth two (2) times a day., Disp: 60 tablet, Rfl: 11    cholestyramine-aspartame (CHOLESTYRAMINE LIGHT) 4 gram PwPk, Mix 1 packet as directed and take by mouth daily., Disp: 60 packet, Rfl: 5    dulaglutide (TRULICITY) 1.5 mg/0.5 mL PnIj, Inject the contents of 2 syringes (3 mg total) under the skin every seven (7) days., Disp: 4 mL, Rfl: 6    ferrous sulfate (SLOW FE) 142 mg (45 mg iron) TbER, Take 1 tablet (142 mg) by mouth two (2) times a day., Disp: 60 tablet, Rfl: 3    furosemide (LASIX) 40 MG tablet, Take 1 tablet (40 mg total) by mouth two (2) times a day., Disp: 60 tablet, Rfl: 11    gabapentin (NEURONTIN) 300 MG capsule, Take 2 capsules (600 mg total) by mouth nightly., Disp: 60 capsule, Rfl: 3    insulin glargine (LANTUS SOLOSTAR U-100 INSULIN) 100 unit/mL (3 mL) injection pen, Inject 0.5 mL (50 Units total) under the skin nightly. Inject as the units of insulin as directed by your doctor once daily around same time every day.  Total daily dose not to exceed 50 units., Disp: 15 mL, Rfl: 0    insulin lispro (HUMALOG) 100 unit/mL injection pen, Inject 10 Units under the skin Three (3) times a day before meals., Disp: 15 mL, Rfl: 2    lactulose 10 gram/15 mL solution, Take 30 mL (20 g total) by mouth Three (3) times a day., Disp: 2700 mL, Rfl: 11    lancets (ACCU-CHEK SOFTCLIX LANCETS) Misc, Use as directed to test once daily., Disp: 100 each, Rfl: 1    metFORMIN (GLUCOPHAGE-XR) 500 MG 24 hr tablet, Take 2 tablets (1,000 mg total) by mouth in the morning and 2 tablets (1,000 mg total) in the evening. Take with meals., Disp: 360 tablet, Rfl: 0    mirtazapine (REMERON) 7.5 MG tablet, Take 1 tablet (7.5 mg total) by mouth., Disp: , Rfl:     pantoprazole (PROTONIX) 40 MG tablet, Take 1 tablet (40 mg total) by mouth two (2) times a day., Disp: 180 tablet, Rfl: 1    pen needle, diabetic (ULTICARE PEN NEEDLE) 32 gauge x 1/4 (6 mm) Ndle, Inject 1 each under the skin nightly., Disp: 100 each, Rfl: 1    promethazine (PHENERGAN) 12.5 MG tablet, Take 1 tablet (12.5 mg total) by mouth every eight (8) hours as needed for nausea., Disp: 60 tablet, Rfl: 1    rifAXIMin (XIFAXAN) 550 mg Tab, Take 1 tablet (550 mg total) by mouth two (2) times a day., Disp: 60 tablet, Rfl: 11    secukinumab (COSENTYX PEN, 2 PENS,) 150 mg/mL PnIj injection, Inject the contents of 2 pens (300 mg total) under the skin every twenty-eight (28) days. Maintenance dose, Disp: 2 mL, Rfl: 5    simvastatin (ZOCOR) 20 MG tablet, Take 1 tablet (20 mg total) by mouth every evening., Disp: 90 tablet, Rfl: 0    spironolactone (ALDACTONE) 100 MG tablet, Take 1 tablet (100 mg total) by mouth two (2) times a day., Disp: 60 tablet, Rfl: 11    zolpidem (AMBIEN) 5 MG tablet, Take 1 tablet (5 mg total) by mouth nightly as needed., Disp: , Rfl:  ROS:  A 10 system review of systems was negative except as stated in HPI.          Objective:      Exam today limited to audio/visual virtual examination capabilities.  The patient is well appearing and in no acute distress.    Data:  I independently reviewed the lab results, imaging studies including viewing the DXA images, if applicable, with the patient.     No results found for: TSH, FREET4, T4TOTAL, FREET3, T3TOTAL, THYROG2NDGEN, THYIAINT, Isaiah Blakes, [facility]    Lab Results   Component Value Date    CALCIUM 10.1 05/08/2023    CALCIUM 8.8 04/19/2023    CALCIUM 8.7 04/18/2023    ALBUMIN 2.6 (L) 05/08/2023    CREATININE 0.56 05/08/2023       Lab Results   Component Value Date    A1C 8.4 (H) 02/11/2023    GLU 198 (H) 05/08/2023    CREATININE 0.56 05/08/2023    ALBCRERAT 7.7 02/11/2023    ALBQTUR 0.3 02/11/2023    CHOL 122 09/12/2021    HDL 39 (L) 09/12/2021    NONHDL 83 09/12/2021    LDL 62 09/12/2021    TRIG 161 09/12/2021               No results found for this or any previous visit.      TOTAL TIME SPENT: *** minutes    I attest that I, Mamie Nick, personally documented this note while acting as scribe for Marvia Pickles, MD.      Mamie Nick, Scribe.  08/11/2023     The documentation recorded by the scribe accurately reflects the service I personally performed and the decisions made by me.    Marvia Pickles, MD

## 2023-08-11 ENCOUNTER — Telehealth
Admit: 2023-08-11 | Discharge: 2023-08-12 | Payer: PRIVATE HEALTH INSURANCE | Attending: "Endocrinology | Primary: "Endocrinology

## 2023-08-11 DIAGNOSIS — E113211 Type 2 diabetes mellitus with mild nonproliferative diabetic retinopathy with macular edema, right eye: Principal | ICD-10-CM

## 2023-08-11 DIAGNOSIS — Z8639 Personal history of other endocrine, nutritional and metabolic disease: Principal | ICD-10-CM

## 2023-08-11 DIAGNOSIS — K7682 Hepatic encephalopathy (CMS-HCC): Principal | ICD-10-CM

## 2023-08-11 DIAGNOSIS — I2510 Atherosclerotic heart disease of native coronary artery without angina pectoris: Principal | ICD-10-CM

## 2023-08-11 DIAGNOSIS — E113512 Type 2 diabetes mellitus with proliferative diabetic retinopathy with macular edema, left eye: Principal | ICD-10-CM

## 2023-08-11 DIAGNOSIS — R601 Generalized edema: Principal | ICD-10-CM

## 2023-08-11 DIAGNOSIS — E785 Hyperlipidemia, unspecified: Principal | ICD-10-CM

## 2023-08-11 DIAGNOSIS — K7460 Unspecified cirrhosis of liver: Principal | ICD-10-CM

## 2023-08-11 DIAGNOSIS — I8510 Secondary esophageal varices without bleeding: Principal | ICD-10-CM

## 2023-08-11 DIAGNOSIS — E113212 Type 2 diabetes mellitus with mild nonproliferative diabetic retinopathy with macular edema, left eye: Principal | ICD-10-CM

## 2023-08-11 DIAGNOSIS — I10 Essential (primary) hypertension: Principal | ICD-10-CM

## 2023-08-11 DIAGNOSIS — Z794 Long term (current) use of insulin: Principal | ICD-10-CM

## 2023-08-11 MED ORDER — DEXCOM G7 SENSOR DEVICE
3 refills | 0 days | Status: CP
Start: 2023-08-11 — End: ?

## 2023-08-11 MED ORDER — DEXCOM G7 RECEIVER
1 refills | 0 days | Status: CP
Start: 2023-08-11 — End: ?

## 2023-08-11 NOTE — Unmapped (Unsigned)
Note  Depression, hepatic encephalopathy, complicated multiple comorbidities and substantial barriers to care including now transportation issues.  Sleepy appearing during the visit but oriented almost x3 not to day of the month.   Lantus 66, humalog 10 bid ac. States was tking invokana and mf 1gbid until a week ago ran out and trulicity 3 but now 2 wks ago last. One low 50s after humalog. Fastings above range 170-200s  A/p drop to half dose mf and trul. Add cgm. Follow up 2-3wks. Do labs first. Ip preferred

## 2023-08-11 NOTE — Unmapped (Addendum)
Start wearing continuous glucose monitor. Contact the office if you have difficulty placing the sensor on or obtaining the sensor. Until you start wearing the sensor, please check blood sugars twice a day and write down your readings and bring readings with you to appointment.    Start taking 1.5 mg of Trulicity once weekly. You can take the 3 mg pen every other week until you run out of the 3 mg pens.     Take metformin once a daily.     Only take the full 10 units of Humalog for full meals. Reduce if you are eating a smaller meal (example: 5 units for half a meal).    Follow up in 3 weeks in person. Have blood work done prior at [facility]. Contact them to see if you need to schedule an appointment.

## 2023-08-12 MED ORDER — TRULICITY 1.5 MG/0.5 ML SUBCUTANEOUS PEN INJECTOR
SUBCUTANEOUS | 6 refills | 56 days | Status: CP
Start: 2023-08-12 — End: ?

## 2023-08-12 MED ORDER — LANTUS SOLOSTAR U-100 INSULIN 100 UNIT/ML (3 ML) SUBCUTANEOUS PEN
Freq: Every evening | SUBCUTANEOUS | 0 refills | 30 days | Status: CP
Start: 2023-08-12 — End: 2023-09-11

## 2023-08-13 ENCOUNTER — Ambulatory Visit (INDEPENDENT_AMBULATORY_CARE_PROVIDER_SITE_OTHER): Payer: Medicaid Other | Admitting: Psychiatry

## 2023-08-13 DIAGNOSIS — Z91199 Patient's noncompliance with other medical treatment and regimen due to unspecified reason: Secondary | ICD-10-CM

## 2023-08-14 DIAGNOSIS — E113212 Type 2 diabetes mellitus with mild nonproliferative diabetic retinopathy with macular edema, left eye: Principal | ICD-10-CM

## 2023-08-14 DIAGNOSIS — Z794 Long term (current) use of insulin: Principal | ICD-10-CM

## 2023-08-15 DIAGNOSIS — Z794 Long term (current) use of insulin: Principal | ICD-10-CM

## 2023-08-15 DIAGNOSIS — E113212 Type 2 diabetes mellitus with mild nonproliferative diabetic retinopathy with macular edema, left eye: Principal | ICD-10-CM

## 2023-08-16 ENCOUNTER — Ambulatory Visit
Admit: 2023-08-16 | Discharge: 2023-08-20 | Disposition: A | Discharge status: Home Health | Payer: PRIVATE HEALTH INSURANCE

## 2023-08-16 ENCOUNTER — Ambulatory Visit: Admit: 2023-08-16 | Discharge: 2023-08-20 | Payer: PRIVATE HEALTH INSURANCE

## 2023-08-16 LAB — BLOOD GAS, VENOUS
BASE EXCESS VENOUS: 2.2 — ABNORMAL HIGH (ref -2.0–2.0)
BASE EXCESS VENOUS: 2.5 — ABNORMAL HIGH (ref -2.0–2.0)
HCO3 VENOUS: 26 mmol/L (ref 22–27)
HCO3 VENOUS: 26 mmol/L (ref 22–27)
O2 SATURATION VENOUS: 70.3 % (ref 40.0–85.0)
O2 SATURATION VENOUS: 64.6 % (ref 40.0–85.0)
PCO2 VENOUS: 38 mmHg — ABNORMAL LOW (ref 40–60)
PCO2 VENOUS: 37 mmHg — ABNORMAL LOW (ref 40–60)
PH VENOUS: 7.45 — ABNORMAL HIGH (ref 7.32–7.43)
PH VENOUS: 7.46 — ABNORMAL HIGH (ref 7.32–7.43)
PO2 VENOUS: 38 mmHg (ref 35–40)
PO2 VENOUS: 35 mmHg (ref 35–40)

## 2023-08-16 LAB — COMPREHENSIVE METABOLIC PANEL
ALBUMIN: 2.7 g/dL — ABNORMAL LOW (ref 3.4–5.0)
ALKALINE PHOSPHATASE: 396 U/L — ABNORMAL HIGH (ref 46–116)
ALT (SGPT): 24 U/L (ref 10–49)
ANION GAP: 7 mmol/L (ref 5–14)
AST (SGOT): 60 U/L — ABNORMAL HIGH (ref <=34)
BILIRUBIN TOTAL: 5.5 mg/dL — ABNORMAL HIGH (ref 0.3–1.2)
BLOOD UREA NITROGEN: 21 mg/dL (ref 9–23)
BUN / CREAT RATIO: 27
CALCIUM: 11 mg/dL — ABNORMAL HIGH (ref 8.7–10.4)
CHLORIDE: 99 mmol/L (ref 98–107)
CO2: 24.6 mmol/L (ref 20.0–31.0)
CREATININE: 0.79 mg/dL
EGFR CKD-EPI (2021) FEMALE: 87 mL/min/{1.73_m2} (ref >=60)
GLUCOSE RANDOM: 347 mg/dL — ABNORMAL HIGH (ref 70–179)
POTASSIUM: 4.6 mmol/L (ref 3.4–4.8)
PROTEIN TOTAL: 9 g/dL — ABNORMAL HIGH (ref 5.7–8.2)
SODIUM: 131 mmol/L — ABNORMAL LOW (ref 135–145)

## 2023-08-16 LAB — URINALYSIS WITH MICROSCOPY WITH CULTURE REFLEX PERFORMABLE
BILIRUBIN UA: NEGATIVE
BLOOD UA: NEGATIVE
BROAD CASTS: 1 /LPF — ABNORMAL HIGH (ref <=0)
GLUCOSE UA: >1000 — AB
HYALINE CASTS: 1 /LPF (ref 0–1)
KETONES UA: NEGATIVE
NITRITE UA: NEGATIVE
PH UA: 5.5 (ref 5.0–9.0)
PROTEIN UA: NEGATIVE
RBC UA: 22 /HPF — ABNORMAL HIGH (ref <=4)
SPECIFIC GRAVITY UA: 1.024 (ref 1.003–1.030)
SQUAMOUS EPITHELIAL: 14 /HPF — ABNORMAL HIGH (ref 0–5)
UROBILINOGEN UA: 3 — AB
WBC UA: 95 /HPF — ABNORMAL HIGH (ref 0–5)

## 2023-08-16 LAB — CBC W/ AUTO DIFF
BASOPHILS ABSOLUTE COUNT: 0 10*9/L (ref 0.0–0.1)
BASOPHILS RELATIVE PERCENT: 0.4 %
EOSINOPHILS ABSOLUTE COUNT: 0.2 10*9/L (ref 0.0–0.5)
EOSINOPHILS RELATIVE PERCENT: 3.1 %
HEMATOCRIT: 34.6 % (ref 34.0–44.0)
HEMOGLOBIN: 11.4 g/dL (ref 11.3–14.9)
LYMPHOCYTES ABSOLUTE COUNT: 0.8 10*9/L — ABNORMAL LOW (ref 1.1–3.6)
LYMPHOCYTES RELATIVE PERCENT: 15.1 %
MEAN CORPUSCULAR HEMOGLOBIN CONC: 33.1 g/dL (ref 32.0–36.0)
MEAN CORPUSCULAR HEMOGLOBIN: 25.9 pg (ref 25.9–32.4)
MEAN CORPUSCULAR VOLUME: 78.5 fL (ref 77.6–95.7)
MEAN PLATELET VOLUME: 7.5 fL (ref 6.8–10.7)
MONOCYTES ABSOLUTE COUNT: 0.6 10*9/L (ref 0.3–0.8)
MONOCYTES RELATIVE PERCENT: 11.6 %
NEUTROPHILS ABSOLUTE COUNT: 3.8 10*9/L (ref 1.8–7.8)
NEUTROPHILS RELATIVE PERCENT: 69.8 %
NUCLEATED RED BLOOD CELLS: 0 /100{WBCs} (ref <=4)
PLATELET COUNT: 223 10*9/L (ref 150–450)
RED BLOOD CELL COUNT: 4.41 10*12/L (ref 3.95–5.13)
RED CELL DISTRIBUTION WIDTH: 21.7 % — ABNORMAL HIGH (ref 12.2–15.2)
WBC ADJUSTED: 5.4 10*9/L (ref 3.6–11.2)

## 2023-08-16 LAB — SLIDE REVIEW

## 2023-08-16 LAB — LIPASE: LIPASE: 82 U/L — ABNORMAL HIGH (ref 12–53)

## 2023-08-16 LAB — IONIZED CALCIUM VENOUS: CALCIUM IONIZED VENOUS (MG/DL): 5.52 mg/dL — ABNORMAL HIGH (ref 4.40–5.40)

## 2023-08-16 LAB — PHOSPHORUS: PHOSPHORUS: 2.1 mg/dL — ABNORMAL LOW (ref 2.4–5.1)

## 2023-08-16 LAB — MAGNESIUM: MAGNESIUM: 1.1 mg/dL — ABNORMAL LOW (ref 1.6–2.6)

## 2023-08-16 LAB — PARATHYROID HORMONE (PTH): PARATHYROID HORMONE INTACT: 17.3 pg/mL — ABNORMAL LOW (ref 18.4–80.1)

## 2023-08-16 LAB — LACTATE, VENOUS, WHOLE BLOOD: LACTATE BLOOD VENOUS: 3.3 mmol/L — ABNORMAL HIGH (ref 0.5–1.8)

## 2023-08-16 LAB — PREGNANCY, URINE: PREGNANCY TEST URINE: NEGATIVE

## 2023-08-16 MED ADMIN — magnesium sulfate 2gm/50mL IVPB: 2 g | INTRAVENOUS | @ 20:00:00 | Stop: 2023-08-16

## 2023-08-16 MED ADMIN — lactated ringers bolus 1,000 mL: 1000 mL | INTRAVENOUS | @ 20:00:00 | Stop: 2023-08-16

## 2023-08-16 MED ADMIN — iohexol (OMNIPAQUE) 350 mg iodine/mL solution 100 mL: 100 mL | INTRAVENOUS | @ 21:00:00 | Stop: 2023-08-16

## 2023-08-16 MED ADMIN — oxyCODONE (ROXICODONE) immediate release tablet 5 mg: 5 mg | ORAL | Stop: 2023-08-16

## 2023-08-16 MED ADMIN — cefTRIAXone (ROCEPHIN) 2 g in sodium chloride 0.9 % (NS) 100 mL IVPB-MBP: 2 g | INTRAVENOUS | @ 23:00:00 | Stop: 2023-08-16

## 2023-08-16 NOTE — Unmapped (Signed)
Pt reports having lower abd pain x2 weeks. Also reports feeling nauseous.

## 2023-08-16 NOTE — Unmapped (Signed)
[facility]  Emergency Department Provider Note        ED Clinical Impression     Final diagnoses:   None       ED Assessment/Plan   Ashley Briggs 57 y.o. 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 2 diabetes mellitus (CMS-HCC) (12/02/2022), Dry eye syndrome, bilateral (12/21/2020), Dysphagia (02/02/2023), Endometrial cancer (CMS-HCC) (2007), Esophageal varices in cirrhosis (CMS-HCC) (06/17/2022), Gastroparesis, Generalized pruritus (02/02/2023), H/O diabetic gastroparesis (01/09/2021), Heart disease, Hemorrhage of rectum and anus (03/17/2011), Hepatic cirrhosis (CMS-HCC) (06/17/2022), Hepatic encephalopathy (CMS-HCC) (06/17/2022), Hepatic steatosis (03/13/2021), High cholesterol, psoriasis (08/06/2022), Hyperlipidemia (12/12/2015), Hypertension, 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), Liver disease, Major depression, Morbid obesity (CMS-HCC) (11/10/2019), Myopia of both eyes with astigmatism and presbyopia (12/02/2022), Nausea (09/22/2022), Non-alcoholic fatty liver disease, NPDR (nonproliferative diabetic retinopathy) (CMS-HCC), Other ascites (09/22/2022), Other chronic pain (11/06/2022), Other insomnia (11/06/2022), PFD (pelvic floor dysfunction) (04/07/2013), Portal hypertension (CMS-HCC) (10/08/2022), Psoriasis, Ptosis of eyelid, left, Reflux, Retinopathy of left eye, background, proliferative (01/09/2021), Right upper quadrant abdominal pain (09/22/2022), Secondary esophageal varices without bleeding (CMS-HCC) (06/17/2022), and Type 2 diabetes mellitus, with long-term current use of insulin (CMS-HCC) (12/12/2015). she presents with LUQ that has been persistent for a week. Pt endorsing focal  tenderness to palpation of LUQ with peritoneal wall thickening observed over this region. Pt also with splenomegaly that has improved since imaging obtained on 05/03/2023. There is some concern for peritonitis with pt having ascites with abdominal pain. Therefore out of an abundance of caution, ceftriaxone started. Will also give IVF for hyperglycemia and hypercalcemia appreciated on labs. 2g Mag given for hypomagnesemia. No acute jaundice however scleral icterus noted on exam.    Other differentials include splenomegaly vs pancreatitis vs bowel obstruction.         Discussion of Management with other Physicians, QHP or Appropriate Source: ED to ED transfer for observation and MCAT Team Consult. Bo  Independent Interpretation of Studies: EKG: normal sinus rhythm, occasional PVC noted, unifocal.  ; RAD CT abdomen as noted in ED course , POCUS  N/A  External Records Reviewed: n/a  Escalation of Care, Consideration of Admission/Observation/Transfer: ED to ED transfer. Hepatology consult   Social determinants that significantly affected care: None applicable  Prescription drug(s) considered but not prescribed:   Diagnostic tests considered but not performed:   History obtained from other sources: None    Medical Decision Making      ED Course as of 08/16/23 1937   Sun Aug 16, 2023   1519 3:20 PM  UA concerning for elevated glucose, squamous epithelial cells, WBC, RBC, urobilinogen and protein. This is likely a contaminated catch therefore will attempt to get a better sample.     3:22 PM  EKG reassuring with no new pathology appreciated. Anterior and inferior infarct previously noted on EKG observed. PVCs noted on EKG.     3:25 PM  Lipase elevated at 82 concerning for pancreatitis. CMP largely unchanged when compared to CMP obtained on 05/08/2023.     4:04 PM  Will give 2 g mag for hypomagnesemia. Hypophosphatemia also noted however in the setting of hypercalcemia, will hold phosphate repletion and  continue IVF.    6:10 PM  Peritoneal thickening in the left abdomen which can be seen with peritonitis in the correct clinical setting. Pt also with new diffuse hypoattenuating lesions throughout liver. 2 cm Bartholin cyst also noted. US performed and unable to identify safe fluid pocket for paracentesis.     6:56 PM  Discussed with MOA and there are no direct admit beds or hospitalists available. Hepatology consult needed to further assess pt's cirrhosis. Therefore will perform ED to ED transfer to for observation and MCAT team consult.    1528 Phosphorus(!): 2.1         History     Chief Complaint   Patient presents with    Abdominal Pain     HPI  Ashley Briggs is a 57 year old female who presents to the emergency department for evaluation of left upper quadrant pain has been ongoing for approximately 2 weeks.  States that the pain originated in the right lower quadrant but has migrated to the periumbilical region and down the left abdomen.  She endorses nausea without vomiting and has had chills.  She denies any alcohol use but does have a history of nonalcoholic liver disease and cirrhosis.  She has been able to tolerate water okay.  Since patient does have cirrhosis she has been limiting in her pain management but states that she has taken a small amount of Tylenol with no improvement in her symptoms.    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 2 diabetes mellitus (CMS-HCC) 12/02/2022    Dry eye syndrome, bilateral 12/21/2020    Dysphagia 02/02/2023    Endometrial cancer (CMS-HCC) 2007    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    Liver disease     Major depression     Morbid obesity (CMS-HCC) 11/10/2019    Myopia of both eyes with astigmatism and presbyopia 12/02/2022    Nausea 09/22/2022    Non-alcoholic fatty liver disease     NPDR (nonproliferative diabetic retinopathy) (CMS-HCC)     Right eye    Other ascites 09/22/2022    Other chronic pain 11/06/2022    Other insomnia 11/06/2022    PFD (pelvic floor dysfunction) 04/07/2013    Portal hypertension (CMS-HCC) 10/08/2022    Psoriasis  Ptosis of eyelid, left     Left upper eyelid    Reflux     Retinopathy of left eye, background, proliferative 01/09/2021    Right upper quadrant abdominal pain 09/22/2022    Secondary esophageal varices without bleeding (CMS-HCC) 06/17/2022    Type 2 diabetes mellitus, with long-term current use of insulin (CMS-HCC) 12/12/2015    Last Assessment & Plan:   The current medical regimen is effective;  continue present plan and medications.  Patient will continue good management of diabetes especially with weight loss. Hopefully with further weight loss will run into the problem of being overmedicated and having to cut back on medications     Some confusion on diabetes medications was have not.been refilled for over a year.  Pa       Past Surgical History:   Procedure Laterality Date    CHOLECYSTECTOMY      HYSTERECTOMY      endometrial cancer    HYSTERECTOMY      OOPHORECTOMY      PR ANAL PRESSURE RECORD Left 03/31/2013    Procedure: ANORECTAL MANOMETRY;  Surgeon: None None;  [facility];  Service: Gastroenterology    PR BREATH HYDROGEN TEST N/A 03/31/2013    Procedure: BREATH HYDROGEN TEST;  Surgeon: None None;  [facility]: [facility];  Service: Gastroenterology    PR COLSC FLX W/RMVL OF TUMOR POLYP LESION SNARE TQ N/A 09/06/2020    Procedure: COLONOSCOPY FLEX; W/REMOV TUMOR/LES BY SNARE;  Surgeon: Chriss Driver, MD;  [facility]: [facility];  Service: Gastroenterology    PR UPPER GI ENDOSCOPY,BIOPSY N/A 04/15/2013    Procedure: UGI ENDOSCOPY; WITH BIOPSY, SINGLE OR MULTIPLE;  Surgeon: Vickii Chafe, MD;  [facility];  Service: Gastroenterology    PR UPPER GI ENDOSCOPY,BIOPSY N/A 09/06/2020    Procedure: UGI ENDOSCOPY; WITH BIOPSY, SINGLE OR MULTIPLE;  Surgeon: Chriss Driver, MD;  Location: [facility];  Service: Gastroenterology    PR UPPER GI ENDOSCOPY,BIOPSY N/A 05/05/2022    Procedure: UGI ENDOSCOPY; WITH BIOPSY, SINGLE OR MULTIPLE;  Surgeon: Chriss Driver, MD;  [facility];  Service: Gastroenterology    PR UPPER GI ENDOSCOPY,BIOPSY N/A 01/22/2023    Procedure: UGI ENDOSCOPY; WITH BIOPSY, SINGLE OR MULTIPLE;  Surgeon: Vonda Antigua, MD;  Location: [facility];  Service: Gastroenterology       Family History   Problem Relation Age of Onset    Diabetes Mother     Cancer Mother     No Known Problems Father     No Known Problems Sister     Diabetes Maternal Grandmother     Hypertension Maternal Grandmother     Cancer Maternal Grandfather     Diabetes Maternal Grandfather     No Known Problems Paternal Grandmother     Skin cancer Paternal Grandfather     Stroke Paternal Grandfather     No Known Problems Brother     No Known Problems Maternal Aunt     No Known Problems Maternal Uncle     No Known Problems Paternal Aunt     No Known Problems Paternal Uncle     No Known Problems Other     Melanoma Neg Hx     Basal cell carcinoma Neg Hx     Squamous cell carcinoma Neg Hx     Substance Abuse Disorder Neg Hx     Alcohol abuse Neg Hx  Drug abuse Neg Hx     Mental illness Neg Hx     Amblyopia Neg Hx     Blindness Neg Hx     Cataracts Neg Hx     Glaucoma Neg Hx     Macular degeneration Neg Hx     Retinal detachment Neg Hx     Strabismus Neg Hx     Thyroid disease Neg Hx     Breast cancer Neg Hx        Social History     Socioeconomic History    Marital status: Widowed   Tobacco Use    Smoking status: Never     Passive exposure: Current    Smokeless tobacco: Never   Vaping Use    Vaping status: Never Used   Substance and Sexual Activity    Alcohol use: Not Currently     Comment: No longer    Drug use: Not Currently    Sexual activity: Not Currently     Partners: Male     Birth control/protection: Abstinence   Other Topics Concern    Do you use sunscreen? Yes    Tanning bed use? No    Are you easily burned? Yes    Excessive sun exposure? No    Blistering sunburns? Yes     Comment: When I was in my teens and twenties   Social History Narrative    ** Merged History Encounter **          Social Determinants of Health     Financial Resource Strain: Low Risk  (04/16/2023)    Overall Financial Resource Strain (CARDIA)     Difficulty of Paying Living Expenses: Not hard at all   Food Insecurity: No Food Insecurity (04/16/2023)    Hunger Vital Sign     Worried About Running Out of Food in the Last Year: Never true     Ran Out of Food in the Last Year: Never true   Transportation Needs: No Transportation Needs (04/16/2023) PRAPARE - Therapist, art (Medical): No     Lack of Transportation (Non-Medical): No   Physical Activity: Insufficiently Active (01/09/2021)    Exercise Vital Sign     Days of Exercise per Week: 1 day     Minutes of Exercise per Session: 40 min   Stress: Stress Concern Present (09/17/2022)    [employer] of Occupational Health - Occupational Stress Questionnaire     Feeling of Stress : Very much   Social Connections: Moderately Isolated (09/02/2018)    Received from [facility]    Social Connection and Isolation Panel [NHANES]     Frequency of Communication with Friends and Family: Once a week     Frequency of Social Gatherings with Friends and Family: Twice a week     Attends Religious Services: Never     Database administrator or Organizations: No     Attends Banker Meetings: Never     Marital Status: Widowed       No current facility-administered medications for this encounter.     Current Outpatient Medications   Medication Sig Dispense Refill    acetaminophen (TYLENOL) 325 MG tablet Take 2 tablets (650 mg total) by mouth two (2) times a day as needed for pain. 180 tablet 1    aspirin (ECOTRIN) 81 MG tablet Take 1 tablet (81 mg total) by mouth daily. 90 tablet 3    baclofen (LIORESAL) 5  mg Tab tablet Take 1 tablet (5 mg total) by mouth nightly. 30 tablet 11    blood sugar diagnostic Strp Test two (2) times a day (30 minutes before a meal). 100 strip 1    blood-glucose meter,continuous (DEXCOM G7 RECEIVER) Misc Use as instructed with G7 sensors. 1 each 1    blood-glucose sensor (DEXCOM G7 SENSOR) Devi Use to monitor blood glucose levels continuously. Change sensor every 10 days. 9 each 3    buPROPion (WELLBUTRIN XL) 150 MG 24 hr tablet Take 1 tablet (150 mg total) by mouth daily.      canagliflozin (INVOKANA) 300 mg Tab tablet Take 1 tablet (300 mg total) by mouth daily before breakfast. 90 tablet 0    carvedilol (COREG) 6.25 MG tablet Take 1 tablet (6.25 mg total) by mouth two (2) times a day. 60 tablet 11    cholestyramine-aspartame (CHOLESTYRAMINE LIGHT) 4 gram PwPk Mix 1 packet as directed and take by mouth daily. 60 packet 5    dulaglutide (TRULICITY) 1.5 mg/0.5 mL PnIj Inject 0.5 mL (1.5 mg total) under the skin every seven (7) days. 4 mL 6    ferrous sulfate (SLOW FE) 142 mg (45 mg iron) TbER Take 1 tablet (142 mg) by mouth two (2) times a day. 60 tablet 3    furosemide (LASIX) 40 MG tablet Take 1 tablet (40 mg total) by mouth two (2) times a day. 60 tablet 11    gabapentin (NEURONTIN) 300 MG capsule Take 2 capsules (600 mg total) by mouth nightly. 60 capsule 3    insulin glargine (LANTUS SOLOSTAR U-100 INSULIN) 100 unit/mL (3 mL) injection pen Inject 0.5 mL (50 Units total) under the skin nightly. Inject as the units of insulin as directed by your doctor once daily around same time every day.  Total daily dose not to exceed 50 units. 15 mL 0    insulin lispro (HUMALOG) 100 unit/mL injection pen Inject 10 Units under the skin Three (3) times a day before meals. 15 mL 2    lactulose 10 gram/15 mL solution Take 30 mL (20 g total) by mouth Three (3) times a day. 2700 mL 11    lancets (ACCU-CHEK SOFTCLIX LANCETS) Misc Use as directed to test once daily. 100 each 1    metFORMIN (GLUCOPHAGE-XR) 500 MG 24 hr tablet Take 2 tablets (1,000 mg total) by mouth in the morning and 2 tablets (1,000 mg total) in the evening. Take with meals. 360 tablet 0    mirtazapine (REMERON) 7.5 MG tablet Take 1 tablet (7.5 mg total) by mouth.      pantoprazole (PROTONIX) 40 MG tablet Take 1 tablet (40 mg total) by mouth two (2) times a day. 180 tablet 1    pen needle, diabetic (ULTICARE PEN NEEDLE) 32 gauge x 1/4 (6 mm) Ndle Inject 1 each under the skin nightly. 100 each 1    promethazine (PHENERGAN) 12.5 MG tablet Take 1 tablet (12.5 mg total) by mouth every eight (8) hours as needed for nausea. 60 tablet 1    rifAXIMin (XIFAXAN) 550 mg Tab Take 1 tablet (550 mg total) by mouth two (2) times a day. 60 tablet 11    secukinumab (COSENTYX PEN, 2 PENS,) 150 mg/mL PnIj injection Inject the contents of 2 pens (300 mg total) under the skin every twenty-eight (28) days. Maintenance dose 2 mL 5    simvastatin (ZOCOR) 20 MG tablet Take 1 tablet (20 mg total) by mouth every evening. 90 tablet 0  spironolactone (ALDACTONE) 100 MG tablet Take 1 tablet (100 mg total) by mouth two (2) times a day. 60 tablet 11    zolpidem (AMBIEN) 5 MG tablet Take 1 tablet (5 mg total) by mouth nightly as needed.             Physical Exam     BP 153/75  - Pulse 95  - Temp 36.8 ??C (98.2 ??F) (Oral)  - Resp 16  - Wt 91.8 kg (202 lb 4.8 oz)  - LMP 08/03/2006  - SpO2 98%  - BMI 34.72 kg/m??     Physical Exam  Constitutional:       Appearance: She is well-developed.   HENT:      Head: Normocephalic and atraumatic.   Eyes:      General: Lids are normal. Lids are everted, no foreign bodies appreciated. Scleral icterus present.      Extraocular Movements: Extraocular movements intact.      Pupils: Pupils are equal, round, and reactive to light.   Cardiovascular:      Rate and Rhythm: Normal rate and regular rhythm.   Pulmonary:      Effort: Pulmonary effort is normal.      Breath sounds: Normal breath sounds.   Abdominal:      General: Bowel sounds are normal.      Palpations: Abdomen is rigid.      Tenderness: There is abdominal tenderness in the left upper quadrant and left lower quadrant. There is no right CVA tenderness, left CVA tenderness, guarding or rebound. Negative signs include Murphy's sign and McBurney's sign.      Hernia: No hernia is present.   Skin:     Capillary Refill: Capillary refill takes less than 2 seconds.   Neurological:      General: No focal deficit present.      Mental Status: She is alert and oriented to person, place, and time.   Psychiatric:         Mood and Affect: Mood normal.         Behavior: Behavior normal.                            Melton Krebs [place], [place]  08/24/23 1009

## 2023-08-17 ENCOUNTER — Ambulatory Visit: Admit: 2023-08-17 | Payer: PRIVATE HEALTH INSURANCE | Attending: Dermatology | Primary: Dermatology

## 2023-08-17 DIAGNOSIS — R1012 Left upper quadrant pain: Secondary | ICD-10-CM | POA: Insufficient documentation

## 2023-08-17 LAB — COMPREHENSIVE METABOLIC PANEL
ALBUMIN: 2.3 g/dL — ABNORMAL LOW (ref 3.4–5.0)
ALBUMIN: 2.2 g/dL — ABNORMAL LOW (ref 3.4–5.0)
ALKALINE PHOSPHATASE: 365 U/L — ABNORMAL HIGH (ref 46–116)
ALKALINE PHOSPHATASE: 334 U/L — ABNORMAL HIGH (ref 46–116)
ALT (SGPT): 25 U/L (ref 10–49)
ALT (SGPT): 23 U/L (ref 10–49)
ANION GAP: 7 mmol/L (ref 5–14)
ANION GAP: 6 mmol/L (ref 5–14)
AST (SGOT): 57 U/L — ABNORMAL HIGH (ref <=34)
AST (SGOT): 68 U/L — ABNORMAL HIGH (ref <=34)
BILIRUBIN TOTAL: 5.4 mg/dL — ABNORMAL HIGH (ref 0.3–1.2)
BILIRUBIN TOTAL: 6.1 mg/dL — ABNORMAL HIGH (ref 0.3–1.2)
BLOOD UREA NITROGEN: 19 mg/dL (ref 9–23)
BLOOD UREA NITROGEN: 18 mg/dL (ref 9–23)
BUN / CREAT RATIO: 28
BUN / CREAT RATIO: 29
CALCIUM: 10.1 mg/dL (ref 8.7–10.4)
CALCIUM: 9.8 mg/dL (ref 8.7–10.4)
CHLORIDE: 99 mmol/L (ref 98–107)
CHLORIDE: 102 mmol/L (ref 98–107)
CO2: 25 mmol/L (ref 20.0–31.0)
CO2: 23 mmol/L (ref 20.0–31.0)
CREATININE: 0.69 mg/dL
CREATININE: 0.63 mg/dL
EGFR CKD-EPI (2021) FEMALE: >90 mL/min/{1.73_m2} (ref >=60)
EGFR CKD-EPI (2021) FEMALE: >90 mL/min/{1.73_m2} (ref >=60)
GLUCOSE RANDOM: 218 mg/dL — ABNORMAL HIGH (ref 70–179)
GLUCOSE RANDOM: 260 mg/dL — ABNORMAL HIGH (ref 70–179)
POTASSIUM: 3.8 mmol/L (ref 3.4–4.8)
POTASSIUM: 4.2 mmol/L (ref 3.4–4.8)
PROTEIN TOTAL: 8 g/dL (ref 5.7–8.2)
PROTEIN TOTAL: 7.6 g/dL (ref 5.7–8.2)
SODIUM: 131 mmol/L — ABNORMAL LOW (ref 135–145)
SODIUM: 131 mmol/L — ABNORMAL LOW (ref 135–145)

## 2023-08-17 LAB — CBC
HEMATOCRIT: 30.4 % — ABNORMAL LOW (ref 34.0–44.0)
HEMOGLOBIN: 10.2 g/dL — ABNORMAL LOW (ref 11.3–14.9)
MEAN CORPUSCULAR HEMOGLOBIN CONC: 33.5 g/dL (ref 32.0–36.0)
MEAN CORPUSCULAR HEMOGLOBIN: 26 pg (ref 25.9–32.4)
MEAN CORPUSCULAR VOLUME: 77.8 fL (ref 77.6–95.7)
MEAN PLATELET VOLUME: 8.4 fL (ref 6.8–10.7)
PLATELET COUNT: 169 10*9/L (ref 150–450)
RED BLOOD CELL COUNT: 3.91 10*12/L — ABNORMAL LOW (ref 3.95–5.13)
RED CELL DISTRIBUTION WIDTH: 22 % — ABNORMAL HIGH (ref 12.2–15.2)
WBC ADJUSTED: 4.2 10*9/L (ref 3.6–11.2)

## 2023-08-17 LAB — MAGNESIUM
MAGNESIUM: 1.3 mg/dL — ABNORMAL LOW (ref 1.6–2.6)
MAGNESIUM: 1.5 mg/dL — ABNORMAL LOW (ref 1.6–2.6)

## 2023-08-17 LAB — LACTATE, VENOUS, WHOLE BLOOD: LACTATE BLOOD VENOUS: 3.6 mmol/L — ABNORMAL HIGH (ref 0.5–1.8)

## 2023-08-17 LAB — HEMOGLOBIN A1C
ESTIMATED AVERAGE GLUCOSE: 249 mg/dL
HEMOGLOBIN A1C: 10.3 % — ABNORMAL HIGH (ref 4.8–5.6)

## 2023-08-17 LAB — PROTIME-INR
INR: 1.22
PROTIME: 13.6 s — ABNORMAL HIGH (ref 9.9–12.6)

## 2023-08-17 LAB — BETA HYDROXYBUTYRATE: BETA-HYDROXYBUTYRATE: 0.14 mmol/L (ref 0.02–0.27)

## 2023-08-17 LAB — IONIZED CALCIUM VENOUS: CALCIUM IONIZED VENOUS (MG/DL): 5.42 mg/dL — ABNORMAL HIGH (ref 4.40–5.40)

## 2023-08-17 MED ADMIN — pantoprazole (Protonix) EC tablet 40 mg: 40 mg | ORAL | @ 07:00:00

## 2023-08-17 MED ADMIN — clotrimazole (MYCELEX) troche 10 mg: 10 mg | ORAL | @ 20:00:00

## 2023-08-17 MED ADMIN — pravastatin (PRAVACHOL) tablet 40 mg: 40 mg | ORAL | @ 07:00:00

## 2023-08-17 MED ADMIN — pantoprazole (Protonix) EC tablet 40 mg: 40 mg | ORAL | @ 15:00:00

## 2023-08-17 MED ADMIN — insulin lispro (HumaLOG) injection 0-20 Units: 0-20 [IU] | SUBCUTANEOUS | @ 15:00:00

## 2023-08-17 MED ADMIN — insulin lispro (HumaLOG) injection 10 Units: 10 [IU] | SUBCUTANEOUS | @ 03:00:00 | Stop: 2023-08-16

## 2023-08-17 MED ADMIN — aspirin chewable tablet 81 mg: 81 mg | ORAL | @ 15:00:00

## 2023-08-17 MED ADMIN — enoxaparin (LOVENOX) syringe 40 mg: 40 mg | SUBCUTANEOUS | @ 15:00:00

## 2023-08-17 MED ADMIN — insulin lispro (HumaLOG) injection 0-20 Units: 0-20 [IU] | SUBCUTANEOUS | @ 22:00:00

## 2023-08-17 MED ADMIN — sodium chloride 0.9% (NS) bolus 500 mL: 500 mL | INTRAVENOUS | @ 06:00:00 | Stop: 2023-08-17

## 2023-08-17 MED ADMIN — oxyCODONE (ROXICODONE) immediate release tablet 5 mg: 5 mg | ORAL | @ 04:00:00 | Stop: 2023-08-17

## 2023-08-17 MED ADMIN — magnesium sulfate 2gm/50mL IVPB: 2 g | INTRAVENOUS | @ 15:00:00 | Stop: 2023-08-17

## 2023-08-17 MED ADMIN — lactulose (CEPHULAC) packet 20 g: 20 g | ORAL | @ 15:00:00

## 2023-08-17 MED ADMIN — carvedilol (COREG) tablet 6.25 mg: 6.25 mg | ORAL | @ 15:00:00

## 2023-08-17 MED ADMIN — lactulose (CEPHULAC) packet 20 g: 20 g | ORAL | @ 20:00:00

## 2023-08-17 MED ADMIN — buPROPion (Wellbutrin XL) 24 hr tablet 150 mg: 150 mg | ORAL | @ 15:00:00

## 2023-08-17 MED ADMIN — magnesium oxide (MAG-OX) tablet 400 mg: 400 mg | ORAL | @ 15:00:00

## 2023-08-17 MED ADMIN — mirtazapine (REMERON) tablet 7.5 mg: 7.5 mg | ORAL | @ 07:00:00

## 2023-08-17 MED ADMIN — insulin glargine (LANTUS) injection 25 Units: 25 [IU] | SUBCUTANEOUS | @ 07:00:00 | Stop: 2023-08-17

## 2023-08-17 MED ADMIN — rifAXIMin (XIFAXAN) tablet 550 mg: 550 mg | ORAL | @ 15:00:00 | Stop: 2023-08-27

## 2023-08-17 NOTE — Unmapped (Addendum)
OCCUPATIONAL THERAPY  Evaluation (08/17/23 0839)    Patient Name:  Ashley Briggs       Medical Record Number: [id number]     Date of Birth: 1966/06/25  Sex: Female      Post-Discharge Occupational Therapy Recommendations: 3x weekly     Equipment Recommendation  OT DME Recommendations: Shower chair with back       OT Treatment Diagnosis: Generalized muscle weakness, Unsteadiness on feet, Reduced mobility      Assessment  Problem List: Decreased safety awareness, Fall risk, Impaired ADLs, Decreased endurance, Impaired balance, Decreased activity tolerance    Personal Factors/Comorbidities (Occupational Profile and History Review): Brief (Low)    Assessment of Occupational Performance : Balance, Endurance, Fine or gross motor coordination, Mobility, Strength    Clinical Decision Making: Low Complexity    Assessment: Manning Avella is a 57 yo F with past medical history of 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 Patient presents today with above noted deficits impacting independence with ADLs/IADLs/functional mobility. Pt was agreeable to therapy this date showing increased indep with functional mobility with use of RW. Pt with noted L posterior lean throughout functional mobility however no LOB noted. Pt has decreased caregiver support and expressed feeling unsteady on her feet. Pt would benefit from skilled acute OT to increased functional independence with ADLs/IADLs and functional mobility to increase safety upon d/c.     Today's Interventions: Pt educated on importance and benefits of early engagement in ADL and OOB mobility with nursing staff and therapy,  Pt educated on safety with bed mobility, functional transfers, safety with RW, energy conservation, compensatory dressing techniques and seated rest breaks.  Formal visual assessment completed this date with no noted functional deficits. Pt verbalizing understanding and left with no questions at end of session.      Activity Tolerance During Today's Session  Tolerated treatment well    Plan  Planned Frequency of Treatment: Plan of Care Initiated: 08/17/23  1-2x per day for: 3-4x week  Planned Treatment Duration: 08/31/23    Planned Interventions:  Self-Care/Home Training, Manual Therapy, Therapeutic Exercise, Home Exercise Program, Education (Patient/Family/Caregiver), Therapeutic Activity      GOALS:   Patient and Family Goals: To return to PLOF    Short Term:   SHORT GOAL #1: Pt will complete toilet t/f with Mod I and LRAD   Time Frame : 2 weeks  SHORT GOAL #2: Pt will complete full body dressing with clothing retrival with Mod I and LRAD   Time Frame : 2 weeks  SHORT GOAL #3: Pt will complete standing grooming task ~10 minutes with Mod I and LRAD   Time Frame : 2 weeks    Long Term Goal #1: Pt will score 20+/24 on AMPAC  Time Frame: 4 weeks    Prognosis:  Good  Positive Indicators:  PLOF  Barriers to Discharge: None    Subjective  Prior Functional Status Pt reports independent with all ADLs and IADLs and uses no AD at baseline. Pt lives with cousin in mobile home. Pt endorses driving but does not work. Pt endorses one fall in the past 6 months, pt reports she fell into a wall.    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    Cataract associated with type 2 diabetes mellitus (CMS-HCC) 12/21/2020    Cirrhosis (CMS-HCC)     Coronary artery disease involving native heart without angina pectoris 02/05/2021    Current moderate episode of major depressive disorder (CMS-HCC) 11/06/2022    Diabetes mellitus (CMS-HCC)     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 type 2 diabetes mellitus (CMS-HCC) 12/02/2022    Dry eye syndrome, bilateral 12/21/2020    Dysphagia 02/02/2023    Endometrial cancer (CMS-HCC) 2007    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    Liver disease     Major depression     Morbid obesity (CMS-HCC) 11/10/2019    Myopia of both eyes with astigmatism and presbyopia 12/02/2022    Nausea 09/22/2022    Non-alcoholic fatty liver disease     NPDR (nonproliferative diabetic retinopathy) (CMS-HCC)     Right eye    Other ascites 09/22/2022    Other chronic pain 11/06/2022    Other insomnia 11/06/2022    PFD (pelvic floor dysfunction) 04/07/2013    Portal hypertension (CMS-HCC) 10/08/2022    Psoriasis     Ptosis of eyelid, left     Left upper eyelid    Reflux     Retinopathy of left eye, background, proliferative 01/09/2021    Right upper quadrant abdominal pain 09/22/2022    Secondary esophageal varices without bleeding (CMS-HCC) 06/17/2022    Type 2 diabetes mellitus, with long-term current use of insulin (CMS-HCC) 12/12/2015    Last Assessment & Plan:   The current medical regimen is effective;  continue present plan and medications.  Patient will continue good management of diabetes especially with weight loss. Hopefully with further weight loss will run into the problem of being overmedicated and having to cut back on medications     Some confusion on diabetes medications was have not.been refilled for over a year.  Pa    Social History     Tobacco Use    Smoking status: Never     Passive exposure: Current    Smokeless tobacco: Never   Substance Use Topics    Alcohol use: Not Currently     Comment: No longer      Past Surgical History:   Procedure Laterality Date    CHOLECYSTECTOMY      HYSTERECTOMY      endometrial cancer    HYSTERECTOMY      OOPHORECTOMY      PR ANAL PRESSURE RECORD Left 03/31/2013    Procedure: ANORECTAL MANOMETRY;  Surgeon: None None;  [facility];  Service: Gastroenterology    PR BREATH HYDROGEN TEST N/A 03/31/2013    Procedure: BREATH HYDROGEN TEST;  Surgeon: None None;  [facility];  Service: Gastroenterology    PR COLSC FLX W/RMVL OF TUMOR POLYP LESION SNARE TQ N/A 09/06/2020    Procedure: COLONOSCOPY FLEX; W/REMOV TUMOR/LES BY SNARE;  Surgeon: Chriss Driver, MD;  [facility]: [facility];  Service: Gastroenterology    PR UPPER GI ENDOSCOPY,BIOPSY N/A 04/15/2013    Procedure: UGI ENDOSCOPY; WITH BIOPSY, SINGLE OR MULTIPLE;  Surgeon: Vickii Chafe, MD;  Location: [facility];  Service: Gastroenterology    PR UPPER GI ENDOSCOPY,BIOPSY N/A 09/06/2020    Procedure: UGI ENDOSCOPY; WITH BIOPSY, SINGLE OR MULTIPLE;  Surgeon: Chriss Driver, MD;  [facility]: [facility];  Service: Gastroenterology    PR UPPER GI ENDOSCOPY,BIOPSY N/A 05/05/2022    Procedure: UGI ENDOSCOPY; WITH BIOPSY, SINGLE OR MULTIPLE;  Surgeon: Chriss Driver, MD;  [facility]: [facility];  Service: Gastroenterology    PR UPPER GI ENDOSCOPY,BIOPSY N/A 01/22/2023    Procedure: UGI ENDOSCOPY; WITH BIOPSY, SINGLE OR MULTIPLE;  Surgeon: Vonda Antigua, MD;  [facility];  Service: Gastroenterology    Family History   Problem Relation Age of Onset    Diabetes Mother     Cancer Mother     No Known Problems Father     No Known Problems Sister     Diabetes Maternal Grandmother     Hypertension Maternal Grandmother     Cancer Maternal Grandfather     Diabetes Maternal Grandfather     No Known Problems Paternal Grandmother     Skin cancer Paternal Grandfather     Stroke Paternal Grandfather     No Known Problems Brother     No Known Problems Maternal Aunt     No Known Problems Maternal Uncle     No Known Problems Paternal Aunt     No Known Problems Paternal Uncle     No Known Problems Other     Melanoma Neg Hx     Basal cell carcinoma Neg Hx     Squamous cell carcinoma Neg Hx     Substance Abuse Disorder Neg Hx     Alcohol abuse Neg Hx     Drug abuse Neg Hx     Mental illness Neg Hx     Amblyopia Neg Hx     Blindness Neg Hx     Cataracts Neg Hx     Glaucoma Neg Hx     Macular degeneration Neg Hx     Retinal detachment Neg Hx     Strabismus Neg Hx     Thyroid disease Neg Hx     Breast cancer Neg Hx         Patient has no known allergies.     Objective Findings  Precautions / Restrictions  Falls precautions       Weight Bearing  Non-applicable    Required Braces or Orthoses  Non-applicable    Communication Preference  Verbal       Pain  Pt denies pain this date.    Equipment / Environment  Vascular access (PIV, TLC, Port-a-cath, PICC), Telemetry    Living Situation  Living Environment: Trailer/Mobile home  Lives With: Other  Home Living: Stairs to enter with rails, Walk-in shower, Standard height toilet, One level home  Rail placement (outside): Bilateral rails in reach  Number of Stairs to Enter (outside): 6  Caregiver Identified?: Yes (lives with cousin - pt reports cousin has MS and uses  walker)  Caregiver Availability: Intermittent  Caregiver Ability: Supervision  Equipment available at home: None     Cognition   Orientation Level:  Oriented x 4   Arousal/Alertness:  Appropriate responses to stimuli   Attention Span:  Attends with cues to redirect   Memory:  Appears intact   Following Commands:  Follows all commands and directions without difficulty   Safety Judgment:  Decreased awareness of need for safety   Awareness of Errors and Problem Solving:  Patient self-corrected errors    Vision / Hearing   Vision: No acute deficits identified, Wears glasses all the time, Glasses present  Vision Comments: Vision formally assessed - no functional deficits noted.  Hearing: No deficit identified     Hand Function:  Right Hand Function: Right hand grip strength, ROM and coordination WNL  Left Hand Function: Left hand grip strength, ROM and coordination WNL  Hand Dominance: Right    Skin Inspection:  Skin Inspection: Intact where visualized    ROM / Strength:  UE ROM/Strength: Left WFL, Right WFL  LE ROM/Strength: Left WFL, Right WFL    Coordination:  Coordination: WFL, Finger to nose intact  Coordination comment: rapid alternating movements tested this date - no noted deficits    Sensation:  RUE Sensation: RUE intact  LUE Sensation: LUE intact  RLE Sensation: RLE intact  LLE Sensation: LLE intact    Balance:  Static Sitting-Level of Assistance: Stand by Camera operator of Assistance: Stand by assistance  Sitting Balance comments: Pt sitting EOB with no LOB or postural sways - pt able to doff shoes and donn socks while seated EOB with set up for task demostrating good dynamic sitting balance.    Static Standing-Level of Assistance: Stand by assistance  Dynamic Standing - Level of Assistance: Stand by assistance  Standing Balance comments: Pt standing at EOB with no LOB or postural sways - with RW, Pt standing sink side with RW washing hands demonstrating good dynamic standing balance.    Functional Mobility  Transfers: Standby assist (sit>stand with no AD no LOB or postural sways. Pt stand<>sit on toilet with RW and use of grab bars and CGA. stand>sit EOB with RW and SBA)  Bed Mobility - Needs Assistance: Modified Independent (sup<>sit with mod I and HOB slightly elevated.)  Ambulation: Pt ambulating around ED with CGA and RW, pt with occasional postural sway L however pt able to self correct       ADLs  ADLs: Needs assistance with ADLs  Feeding - Needs Assistance:  (independent - pt denies difficulties, pt demonstrating coordination and AROM necessary for feeding)  Grooming - Needs Assistance:  (independent - pt denies difficulties, pt demonstrating coordination and AROM necessary for grooming)  Bathing - Needs Assistance: Supervision, Performed seated (pt denies difficulties, pt demonstrating coordination and AROM necessary for  bathing however suspected supervision and seat needed d/t postural sways during ambulation)  Toileting - Needs Assistance: Contact Guard assist (pt denies difficulties, pt demonstrating coordination and AROM necessary for toileting, pt performing full toileting routine during session with SBA)  UB Dressing - Needs Assistance:  (independent - pt denies difficulties, pt demonstrating coordination and AROM necessary for UB dressing)  LB Dressing - Needs Assistance: Set Up Assist (pt denies difficulties, pt demonstrating coordination and AROM necessary for LB dressing. Pt doffing shoes and donning socks with set up for task this date)    Vitals / Orthostatics  Vitals/Orthostatics: VSS on RA  - monitored through telemetry.  Patient at end of session: All needs in reach, In bed, Lines intact, Nurse notified     Occupational Therapy Session Duration  OT Individual [mins]: 10  OT Co-Treatment [mins]: 11 (Co  Reason for Co-treatment: To safely progress mobility, Poor activity tolerance       AM-PAC-Daily Activity  Lower Body Dressing assistance needs: A Little - Minimal/Contact Guard Assist/Supervision  Bathing assistance needs: A Little - Minimal/Contact Guard Assist/Supervision  Toileting assistance needs: A Little - Minimal/Contact Guard Assist/Supervision  Upper Body Dressing assistance needs: None - Modified Independent/Independent  Personal Grooming assistance needs: None - Modified Independent/Independent  Eating Meals assistance needs: None - Modified Independent/Independent    Daily Activity Score: 21    Score (in points): % of Functional Impairment, Limitation, Restriction  6: 100% impaired, limited, restricted  7-8: At least 80%, but less than 100% impaired, limited restricted  9-13: At least 60%, but less than 80% impaired, limited restricted  14-19: At least 40%, but less than 60% impaired, limited restricted  20-22: At least 20%, but less than 40% impaired, limited restricted  23: At least 1%, but less than 20% impaired, limited restricted  24: 0% impaired, limited restricted      I attest that I have reviewed the above information.  Signed: Epimenio Sarin, OT  Filed 08/17/2023    As a part of my new employee training, I have completed the therapy assessment, intervention and documentation for the patient stated above under the direction and guidance of [employer] OTR/L
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
## 2023-08-17 NOTE — Unmapped (Signed)
ED Progress Note    Received sign out from previous provider.    Patient Summary: Ashley Briggs is a 57 y.o. female with history of DM, HTN, high cholesterol, NAFLD, cirrhosis, portal hypertension, esophageal varices, CAD who presented to the [facility] ED with worsening left upper quadrant pain x 2 weeks.  Workup was notable for hyperglycemia, hypercalcemia, hypomagnesemia, hypophosphatemia, hyponatremia, CT notable for increased hepatic lesions, interval decrease in splenomegaly, peritoneal thickening in the left hemiabdomen correlating with exam with concern for localized peritonitis    Action List:   Hepatology evaluation    Updates  ED Course as of 08/17/23 0350   Mon Aug 17, 2023   0022 Patient reassessed, resting comfortably in bed however does have localized peritonitis in the left upper quadrant, abdomen otherwise soft.  Patient is hemodynamically stable.  Will repeat basic labs as well as lactate following her 1L of LR.  MAO is aware that the patient has arrived and is reaching out to hospitalist.  Patient to see hepatology in the AM.

## 2023-08-17 NOTE — Unmapped (Signed)
ED Progress Note    Received sign out from previous provider.    Patient Summary: Ashley Briggs is a 57 y.o. female  with history of DM, HTN, high cholesterol, NAFLD, cirrhosis, portal hypertension, esophageal varices, CAD who presented to the [facility] ED with worsening left upper quadrant pain x 2 weeks.  Workup was notable for hyperglycemia, hypercalcemia, hypomagnesemia, hypophosphatemia, hyponatremia, CT notable for increased hepatic lesions, interval decrease in splenomegaly, peritoneal thickening in the left hemiabdomen correlating with exam with concern for localized peritonitis   Action List:   Pending admission to hospitalist team with hepatic team following.    Updates  ED Course as of 08/17/23 0742   Mon Aug 17, 2023   0710 57 yo M LUQ pain x 3 day. CT showed new peritonitis

## 2023-08-17 NOTE — Unmapped (Signed)
Bed: 22-B  Expected date:   Expected time:   Means of arrival:   Comments:  transfer

## 2023-08-17 NOTE — Unmapped (Addendum)
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 Her hospital course is detailed below:    Abdominal Pain:   Pain in the left upper quadrant that has worsened over the past week. Her WBC is normal and she has been afebrile, and has had no prior episodes of SBP (positive ascitic fluid culture in May was believed to be contaminant).  ED attempted diagnostic paracentesis but no apparent fluid pocket to safely perform the procedure. Will consult medicine procedure service to evaluate again in the morning.  In the interim, will cover for SBP.  The patient's pain is very much localized to the LUQ and seems to be worsened by palpation of enlarged spleen.  CT abdomen pelvis showed splenomegaly with hypoattenuating lesions grossly unchanged from prior but with multiple hypoattenuating lesions.  MRI showed mild increase in size and number of splenic hemangiomas seen prior. CTA chest did not reveal PE or pleural effusion but did note remote R sided rib fractures. Unable to determine cause of pain but likely MSK given significant workup was all negative. Pain was managed with oxycodone and gabapentin. Empirically treated for SBP with ciprofloxacin. Will follow up with hepatology and PCP outpatient.       Decompensated NASH Cirrhosis:  Complicated by portal hypertension, grade II esophageal varices and portal hypertensive gastropathy, ascites and episodes of hepatic encephalopathy.  Followed by [facility] Hepatology, last seen in February 2024 with discussion of live donor liver transplantation at that time, pending further weight loss.  Since that appointment was admitted to [facility] in May 2024 and has yet to follow up with GI again, had an appointment scheduled for 9/3 but cancelled due to transportation issues. Hepatology was consulted upon arrival and will follow outpatient.    Hypoattenuating Lesions in Liver:  Noted on CT abdomen/pelvis, increased since prior study approximately 9 months ago.  Could represent regenerative nodules vs dysplastic nodules vs HCC. Follow up MRI revealed interval increase in size and number of presumed hemangiomas. Will follow up with Hepatology outpatient.    Hypercalcemia - Hypomagnesemia:  Calcium 11 on initial chemistry, ionized calcium of 5.52  PTH 17.3. Was hypomagnesemic on admission. Electrolytes repleted daily.    Urinary Tract Infection:   UA with large nitrite and 95 WBC/hpf. Patient reports no dysuria, frequency or urgency. May be asymptomatic bacteriuria but was treated empirically for SBP.    Acute Kidney Injury:   Mild elevation of serum creatinine from baseline ~0.5 to ~0.8  May be secondary to potential urinary tract infection or prerenal from overdiuresis/dehydration.  Does have decompensated cirrhosis so potentially could have some element of hepatorenal but I feel this is less likely as renal function has previously been very stable. Cr 0.7 on discharge.    Type II Diabetes:  Followed by [facility] endocrinology.  Currently in the process of receiving a dexcom, but for now is checking glucose once daily or with symptoms. Infrequent hypoglycemia episodes.  Home regimen includes lantus 50 units nightly, lispro 10 units TID with meals, metformin, trulicity and invokana. Increased towards home dose insulin as PO intake improved. Towards end of hospitalization required increase to 15 lispro with meals.    Hypertension:   Blood pressure normal range on admission, continued on home cavedilol.    Coronary Artery Disease:   Continued home  carvedilol and aspirin on admission.    Calcified Cerebral Meningioma - Headaches:    Known small meningioma. Follows with [facility] neurosurgery and neurology.  Experiences headaches and dizziness, unclear if related to the lesion or simply migraines. Annual MRI performed for surveillance, and has remained stable in size. Continued on home medications.    Plaque Psoriasis:   Takes monthly secukinumab, last taken in the past week.     Anxiety/ Depression:   Continued home mirtazapine.

## 2023-08-17 NOTE — Unmapped (Signed)
Bed: 80-D  Expected date:   Expected time:   Means of arrival:   Comments:  22

## 2023-08-17 NOTE — Unmapped (Signed)
Internal Medicine (MED[facility]) History & Physical    Medicine (Medicine Care Advancement Team) consult note from 08/17/23 will serve as medicine admission H&P.      Team Contact Information:   Primary Team: Internal Medicine (MEDL)  Primary Resident: Junius Roads, MD  Resident's Pager: [phone] Med[facility] Intern - Tower)    Junius Roads, MD

## 2023-08-17 NOTE — Unmapped (Signed)
Received sign out from previous provider.    Patient Summary: Ashley Briggs is a 57 y.o. female with history of CAD, diabetes mellitus complicated by retinopathy and gastroparesis, hyperlipidemia, hypertension, psoriasis, MASH cirrhosis complicated by hepatic encephalopathy, diuretic responsive ascites, nonbleeding esophageal varices on carvedilol presented with 2 weeks of left upper quadrant abdominal pain.  Patient's MELD score was increased from prior and there is concern for decompensated MASH cirrhosis.  Also AKI.  Gastroenterology was consulted.  Patient is being managed by the medical care advancement team while awaiting admission.  Action List:   Admit    Updates

## 2023-08-17 NOTE — Unmapped (Signed)
PHYSICAL THERAPY  Evaluation (08/17/23 0838)          Patient Name:  Ashley Briggs       Medical Record Number: [id number]   Date of Birth: March 19, 1966  Sex: Female        Post-Discharge Physical Therapy Recommendations:  PT Post Acute Discharge Recommendations: Skilled PT services indicated, 3x weekly   Equipment Recommendation  PT DME Recommendations: Rolling walker             Treatment Diagnosis: Decreased mobility, decreased gait stability     Activity Tolerance: Tolerated treatment well     ASSESSMENT  Problem List: Decreased mobility, Gait deviation, Fall risk, Pain, Impaired balance, Decreased endurance      Assessment : Ashley Briggs is a 57 yo F with past medical history of 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     Patient with pertinent PMH above who presents to PT with associated deficits in mobility. She is abel to complete bed mobility and ambulation with SBA-supervision requiring occasional cues for safe RW use due to L lean. She presents close to her functional baseline and is safe to discharge home from a PT perspective with follow-up home health therapy, as well as a RW for improved gait stability. Recommending post-acute PT frequency of 3x to progress deficits needed to safely return to PLOF.    After a review of the personal factors, comorbidities, clinical presentation, and examination of the number of affected body systems, the patient presents as a moderate complexity case.         Today's Interventions: PT eval, supine <> sit, STS, ambulation, education re: PT role, POC                 Clinical Decision Making: Moderate        PLAN  Planned Frequency of Treatment: Plan of Care Initiated: 08/17/23  1-2x per day for: 3-4x week  Planned Treatment Duration: 08/31/23     Planned Interventions: Education (Patient/Family/Caregiver), Gait training, Therapeutic Exercise, Self-care / Home Management training, Therapeutic Activity     Goals:         SHORT GOAL #1: Complete transfers mod I with LRAD               Time Frame : 2 weeks  SHORT GOAL #2: Ambulate 300' mod I with LRAD              Time Frame : 2 weeks  SHORT GOAL #3: Navigate 6 stairs mod I with LRAD              Time Frame : 2 weeks                                           Long Term Goal #1: AMPAC 24/24  Time Frame: 4 weeks     Prognosis:  Good  Positive Indicators: PLOF, participation  Barriers to Discharge: None     SUBJECTIVE  Communication Preference: Verbal,     Patient reports: Agreeable to PT - I feel kind of lousy.        Prior Functional Status: Independent with mobility and ADLS no device. Lives with cousin who has MS and is unable to provide physical  assistance, but helps with cooking, cleaning, etc.  Equipment available at home: None        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 2 diabetes mellitus (CMS-HCC) 12/02/2022    Dry eye syndrome, bilateral 12/21/2020    Dysphagia 02/02/2023    Endometrial cancer (CMS-HCC) 2007    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    Liver disease     Major depression     Morbid obesity (CMS-HCC) 11/10/2019    Myopia of both eyes with astigmatism and presbyopia 12/02/2022    Nausea 09/22/2022    Non-alcoholic fatty liver disease     NPDR (nonproliferative diabetic retinopathy) (CMS-HCC)     Right eye    Other ascites 09/22/2022    Other chronic pain 11/06/2022    Other insomnia 11/06/2022    PFD (pelvic floor dysfunction) 04/07/2013    Portal hypertension (CMS-HCC) 10/08/2022    Psoriasis     Ptosis of eyelid, left     Left upper eyelid    Reflux     Retinopathy of left eye, background, proliferative 01/09/2021    Right upper quadrant abdominal pain 09/22/2022    Secondary esophageal varices without bleeding (CMS-HCC) 06/17/2022    Type 2 diabetes mellitus, with long-term current use of insulin (CMS-HCC) 12/12/2015    Last Assessment & Plan:   The current medical regimen is effective;  continue present plan and medications.  Patient will continue good management of diabetes especially with weight loss. Hopefully with further weight loss will run into the problem of being overmedicated and having to cut back on medications     Some confusion on diabetes medications was have not.been refilled for over a year.  Pa            Social History     Tobacco Use    Smoking status: Never     Passive exposure: Current    Smokeless tobacco: Never   Substance Use Topics    Alcohol use: Not Currently     Comment: No longer       Past Surgical History:   Procedure Laterality Date    CHOLECYSTECTOMY      HYSTERECTOMY      endometrial cancer    HYSTERECTOMY  OOPHORECTOMY      PR ANAL PRESSURE RECORD Left 03/31/2013 Procedure: ANORECTAL MANOMETRY;  Surgeon: None None;  [facility]: [facility];  Service: Gastroenterology    PR BREATH HYDROGEN TEST N/A 03/31/2013    Procedure: BREATH HYDROGEN TEST;  Surgeon: None None;  [facility];  Service: Gastroenterology    PR COLSC FLX W/RMVL OF TUMOR POLYP LESION SNARE TQ N/A 09/06/2020    Procedure: COLONOSCOPY FLEX; W/REMOV TUMOR/LES BY SNARE;  Surgeon: Chriss Driver, MD;  Location: [facility];  Service: Gastroenterology    PR UPPER GI ENDOSCOPY,BIOPSY N/A 04/15/2013    Procedure: UGI ENDOSCOPY; WITH BIOPSY, SINGLE OR MULTIPLE;  Surgeon: Vickii Chafe, MD;  Location: [facility];  Service: Gastroenterology    PR UPPER GI ENDOSCOPY,BIOPSY N/A 09/06/2020    Procedure: UGI ENDOSCOPY; WITH BIOPSY, SINGLE OR MULTIPLE;  Surgeon: Chriss Driver, MD;  Location: GI [facility];  Service: Gastroenterology    PR UPPER GI ENDOSCOPY,BIOPSY N/A 05/05/2022    Procedure: UGI ENDOSCOPY; WITH BIOPSY, SINGLE OR MULTIPLE;  Surgeon: Chriss Driver, MD;  [facility]: [facility];  Service: Gastroenterology    PR UPPER GI ENDOSCOPY,BIOPSY N/A 01/22/2023    Procedure: UGI ENDOSCOPY; WITH BIOPSY, SINGLE OR MULTIPLE;  Surgeon: Vonda Antigua, MD;  Location: GI [facility]URES [facility];  Service: Gastroenterology             Family History   Problem Relation Age of Onset    Diabetes Mother     Cancer Mother     No Known Problems Father     No Known Problems Sister     Diabetes Maternal Grandmother     Hypertension Maternal Grandmother     Cancer Maternal Grandfather     Diabetes Maternal Grandfather     No Known Problems Paternal Grandmother     Skin cancer Paternal Grandfather     Stroke Paternal Grandfather     No Known Problems Brother     No Known Problems Maternal Aunt     No Known Problems Maternal Uncle     No Known Problems Paternal Aunt     No Known Problems Paternal Uncle     No Known Problems Other     Melanoma Neg Hx     Basal cell carcinoma Neg Hx     Squamous cell carcinoma Neg Hx     Substance Abuse Disorder Neg Hx     Alcohol abuse Neg Hx     Drug abuse Neg Hx     Mental illness Neg Hx     Amblyopia Neg Hx     Blindness Neg Hx     Cataracts Neg Hx     Glaucoma Neg Hx     Macular degeneration Neg Hx     Retinal detachment Neg Hx     Strabismus Neg Hx     Thyroid disease Neg Hx     Breast cancer Neg Hx         Allergies: Patient has no known allergies.                  Objective Findings  Precautions / Restrictions  Precautions: Falls precautions  Weight Bearing Status: Non-applicable  Required Braces or Orthoses: Non-applicable     Pain Comments: LUQ pain, 9/10  Medical Tests / Procedures: Reviewed in Epic  Equipment / Environment: Vascular access (PIV, TLC, Port-a-cath, PICC), Telemetry     Vitals/Orthostatics :  VSS via telemetry, asymptomatic     Living Situation  Living Environment: Trailer/Mobile home  Lives With: Other (cousin)  Home Living: Stairs to enter with rails, Walk-in shower, Standard height toilet, One level home  Rail placement (outside): Bilateral rails in reach  Number of Stairs to Enter (outside): 6      Cognition: WFL  Orientation: Oriented x4  Visual/Perception: Wears Glasses/Contacts  Hearing: No deficit identified     Skin Inspection: Intact where visualized     Upper Extremities  UE ROM: Right WFL, Left WFL  UE Strength: Left WFL, Right WFL    Lower Extremities  LE ROM: Right WFL, Left WFL  LE Strength: Left WFL, Right WFL          Sensation: WFL  Posture: WFL    Static Sitting-Level of Assistance: Independent  Dynamic Sitting-Level of Assistance: Archivist Standing-Level of Assistance: Supervision  Dynamic Standing - Level of Assistance: Supervision      Bed Mobility: Supine to Sit  Supine to Sit assistance level: Standby assist, set-up cues, supervision of patient - no hands on  Bed Mobility comments: supervision with elevated HOB     Transfers: Sit to Stand  Sit to Stand assistance level: Standby assist, set-up cues, supervision of patient - no hands on  Transfer comments: supervision with RW      Gait Level of Assistance: Standby assist, set-up cues, supervision of patient - no hands on  Gait Assistive Device: Rolling walker  Gait Distance Ambulated (ft): 300 ft  Skilled Treatment Performed: SBA-supervision with RW, occasional cues to correct L lean and safe RW management/obstacle navigation     Stairs: NT, displays functional LE strength and standing marches                 Patient at end of session: All needs in reach, In bed, Lines intact, Nurse notified    Physical Therapy Session Duration  PT Individual [mins]: 10  PT Co-Treatment [mins]: 11  Reason for Co-treatment: To safely progress mobility (Seen with Thermon Leyland, OT)          AM-PAC-6 click  Help currently need turning over In bed?: None - Modified Independent/Independent  Help currently needed sitting down/standing up from chair with arms? : A Little - Minimal/Contact Guard Assist/Supervision  Help currently needed moving from supine to sitting on edge of bed?: None - Modified Independent/Independent  Help currently needed moving to and from bed from wheelchair?: A Little - Minimal/Contact Guard Assist/Supervision  Help currently needed walking in a hospital room?: A Little - Minimal/Contact Guard Assist/Supervision  Help currently needed climbing 3-5 steps with railing?: A Little - Minimal/Contact Guard Assist/Supervision    Basic Mobility Score 6 click: 20    6 click Score (in points): % of Functional Impairment, Limitation, Restriction  6: 100% impaired, limited, restricted  7-8: At least 80%, but less than 100% impaired, limited restricted  9-13: At least 60%, but less than 80% impaired, limited restricted  14-19: At least 40%, but less than 60% impaired, limited restricted  20-22: At least 20%, but less than 40% impaired, limited restricted  23: At least 1%, but less than 20% impaired, limited restricted  24: 0% impaired, limited restricted        I attest that I have reviewed the above information.  Signed: Rise Paganini, PT  Filed 08/17/2023

## 2023-08-17 NOTE — Unmapped (Signed)
Hepatology Consult Service   Initial Consultation         Assessment and Recommendations:   Ashley Briggs is a 57 y.o. female with a PMHx of CAD, DM c/b retinopathy and gastroparesis, HLD, HTN, psoriasis, MASH cirrhosis (d/b HE, diuretic-responsive ascites, and non-bleeding EV on carvedilol), who presents 2-week history of LUQ pain. The patient is seen in consultation at the request of No att. providers found (Emergency Medicine) for decompensated cirrhosis.    #LUQ Pain  #Decompensated MASH cirrhosis  Presenting with acute LUQ pain, no clear etiology seen on imaging though did remark on new numerous hypodensities in the liver and stable hypodensities in the spleen which should be further characterized by MRI.  Given worsening MELD, would rule out infectious etiologies with blood cultures.  Urine culture pending.  Agree with ascites scan to evaluate for diagnostic paracentesis, though would need to be interpreted within the context of empiric ceftriaxone. Of note, she has a history of an ascites culture 04/15/23 MSSA and staph epi. Liver ultrasound Doppler without clot. Denies alcohol use and no prior history. Electrolyte abnormalities likely secondary to poor PO intake in the setting of oral thrush, agree with repletion.  MELD 3.0: 22 at 08/17/2023 12:16 AM  MELD-Na: 20 at 08/17/2023 12:16 AM  Calculated from:  Serum Creatinine: 0.69 mg/dL (Using min of 1 mg/dL) at 1/61/0960 45:40 AM  Serum Sodium: 131 mmol/L at 08/17/2023 12:16 AM  Total Bilirubin: 5.4 mg/dL at 9/81/1914 78:29 AM  Serum Albumin: 2.3 g/dL at 5/62/1308 65:78 AM  INR(ratio): 1.22 at 08/17/2023 12:16 AM  Age at listing (hypothetical): 56 years  Sex: Female at 08/17/2023 12:16 AM  -- Please obtain a diagnostic paracentesis with cell count, Gram stain + aerobic/anaerobic culture, protein, albumin, and glucose; will need to be interpreted in the context of empiric CTX  -- Recommend follow up MRI to evaluate hypodensities in liver/spleen  -- Recommend electrolyte repletion for Mg and Phos  -- Continue home dose lasix and spironolactone  -- Continue home dose lactulose and rifaximin  -- Agree with CTX for now  -- Recommend 2g sodium-restricted diet and consult to nutrition for supplement recommendations  -- Strict I/Os q4h with daily weights  -- Trend at least CMP, PT/INR, and platelets daily    Issues Impacting Complexity of Management:  -The patient has the need for intensive monitoring parameter(s) due to high-risk of clinical decline: frequent monitoring of MELD score to monitor for progression to/progression of acute liver failure    Recommendations discussed with the patient's primary team. We will continue to follow along with you.    Subjective:   57 y.o. female with CAD, DM c/b retinopathy and gastroparesis, HLD, HTN, psoriasis, MASH cirrhosis (d/b HE, diuretic-responsive ascites, and non-bleeding EV on carvedilol), who presents 2-week history of LUQ pain.  Denies any associated symptoms such as fever, emesis, diarrhea, or constipation.  Taking lactulose and rifaximin at home as prescribed.  Also taking diuretics as prescribed.  Denies worsening ascites.  She has chronic nausea which has not worsened during this timeframe.    In the ED, VSS and afebrile.  Labs with worsening hyponatremia and hyperbilirubinemia from baseline.  CT A/P with increased numerous hypoattenuating lesions throughout the liver new from prior scan 12/19/2022, possibly regenerative nodules but dysplasia versus HCC could not be excluded.  2 cm Bartholin gland cyst with mild peripheral enhancement, cannot exclude superimposed infection.  Ultrasound liver Doppler with small volume ascites, sluggish left portal vein velocity but no visualized clot.  Patient of Dr. Eudelia Bunch last seen 01/27/2023.  At that time, MELD 13.  Had new ascites and was started on Lasix and spironolactone.  Of note ascites fluid culture 04/15/2023 with MSSA and staph epi, felt to be contaminant.  She is not on SBP prophylaxis as outpatient.  Objective:   Temp:  [35.9 ??C (96.7 ??F)-37.2 ??C (99 ??F)] 36.7 ??C (98 ??F)  Heart Rate:  [89-103] 103  SpO2 Pulse:  [84-98] 94  Resp:  [15-21] 17  BP: (104-153)/(48-80) 108/60  SpO2:  [92 %-98 %] 92 %    Gen: Chronically ill-appearing female in NAD, answers questions appropriately  Eyes: Sclera anicteric  Mouth: thrush on tongue and soft palate  Abdomen: Soft, TTP in LUQ only.  No palpable hernia.  No rebound or guarding.    Extremities: No clubbing, cyanosis, or edema in the BLEs  Neuro: Normal speech.   Skin: No rashes, lesions on clothed exam  Psych: Alert, normal mood and affect.     Pertinent Labs/Studies:  Labs 08/17/2023 reviewed with worsening hyperbilirubinemia.  Also with hyponatremia, hypomagnesemia, and hypophosphatemia.  MELD 22  CT A/P reviewed with new hypoattenuating lesions throughout the liver, stable hypoattenuating lesions in the spleen.  Bartholin gland cyst.  Ultrasound liver Doppler with small volume ascites, sluggish left portal vein velocity but no visualized clot.

## 2023-08-18 LAB — COMPREHENSIVE METABOLIC PANEL
ALBUMIN: 2.1 g/dL — ABNORMAL LOW (ref 3.4–5.0)
ALKALINE PHOSPHATASE: 353 U/L — ABNORMAL HIGH (ref 46–116)
ALT (SGPT): 25 U/L (ref 10–49)
ANION GAP: 9 mmol/L (ref 5–14)
AST (SGOT): 59 U/L — ABNORMAL HIGH (ref <=34)
BILIRUBIN TOTAL: 5.6 mg/dL — ABNORMAL HIGH (ref 0.3–1.2)
BLOOD UREA NITROGEN: 15 mg/dL (ref 9–23)
BUN / CREAT RATIO: 20
CALCIUM: 9.6 mg/dL (ref 8.7–10.4)
CHLORIDE: 100 mmol/L (ref 98–107)
CO2: 22 mmol/L (ref 20.0–31.0)
CREATININE: 0.76 mg/dL
EGFR CKD-EPI (2021) FEMALE: >90 mL/min/{1.73_m2} (ref >=60)
GLUCOSE RANDOM: 318 mg/dL — ABNORMAL HIGH (ref 70–179)
POTASSIUM: 3.8 mmol/L (ref 3.4–4.8)
PROTEIN TOTAL: 7.3 g/dL (ref 5.7–8.2)
SODIUM: 131 mmol/L — ABNORMAL LOW (ref 135–145)

## 2023-08-18 LAB — CBC W/ AUTO DIFF
BASOPHILS ABSOLUTE COUNT: 0 10*9/L (ref 0.0–0.1)
BASOPHILS RELATIVE PERCENT: 0.4 %
EOSINOPHILS ABSOLUTE COUNT: 0.2 10*9/L (ref 0.0–0.5)
EOSINOPHILS RELATIVE PERCENT: 4.3 %
HEMATOCRIT: 30.9 % — ABNORMAL LOW (ref 34.0–44.0)
HEMOGLOBIN: 10 g/dL — ABNORMAL LOW (ref 11.3–14.9)
LYMPHOCYTES ABSOLUTE COUNT: 0.6 10*9/L — ABNORMAL LOW (ref 1.1–3.6)
LYMPHOCYTES RELATIVE PERCENT: 16.5 %
MEAN CORPUSCULAR HEMOGLOBIN CONC: 32.3 g/dL (ref 32.0–36.0)
MEAN CORPUSCULAR HEMOGLOBIN: 25.7 pg — ABNORMAL LOW (ref 25.9–32.4)
MEAN CORPUSCULAR VOLUME: 79.7 fL (ref 77.6–95.7)
MEAN PLATELET VOLUME: 8.2 fL (ref 6.8–10.7)
MONOCYTES ABSOLUTE COUNT: 0.5 10*9/L (ref 0.3–0.8)
MONOCYTES RELATIVE PERCENT: 13.5 %
NEUTROPHILS ABSOLUTE COUNT: 2.6 10*9/L (ref 1.8–7.8)
NEUTROPHILS RELATIVE PERCENT: 65.3 %
PLATELET COUNT: 131 10*9/L — ABNORMAL LOW (ref 150–450)
RED BLOOD CELL COUNT: 3.88 10*12/L — ABNORMAL LOW (ref 3.95–5.13)
RED CELL DISTRIBUTION WIDTH: 22 % — ABNORMAL HIGH (ref 12.2–15.2)
WBC ADJUSTED: 3.9 10*9/L (ref 3.6–11.2)

## 2023-08-18 LAB — PROTIME-INR
INR: 1.3
PROTIME: 14.5 s — ABNORMAL HIGH (ref 9.9–12.6)

## 2023-08-18 LAB — MAGNESIUM: MAGNESIUM: 1.3 mg/dL — ABNORMAL LOW (ref 1.6–2.6)

## 2023-08-18 MED ADMIN — magnesium sulfate 2gm/50mL IVPB: 2 g | INTRAVENOUS | @ 23:00:00 | Stop: 2023-08-18

## 2023-08-18 MED ADMIN — LORazepam (ATIVAN) tablet 1 mg: 1 mg | ORAL | @ 12:00:00 | Stop: 2023-08-18

## 2023-08-18 MED ADMIN — spironolactone (ALDACTONE) tablet 100 mg: 100 mg | ORAL | @ 22:00:00

## 2023-08-18 MED ADMIN — insulin lispro (HumaLOG) injection 0-20 Units: 0-20 [IU] | SUBCUTANEOUS | @ 19:00:00

## 2023-08-18 MED ADMIN — pantoprazole (Protonix) EC tablet 40 mg: 40 mg | ORAL | @ 01:00:00

## 2023-08-18 MED ADMIN — insulin lispro (HumaLOG) injection 0-20 Units: 0-20 [IU] | SUBCUTANEOUS | @ 02:00:00

## 2023-08-18 MED ADMIN — metFORMIN (GLUCOPHAGE-XR) 24 hr tablet 1,000 mg: 1000 mg | ORAL | @ 22:00:00

## 2023-08-18 MED ADMIN — clotrimazole (MYCELEX) troche 10 mg: 10 mg | ORAL | @ 22:00:00 | Stop: 2023-08-25

## 2023-08-18 MED ADMIN — clotrimazole (MYCELEX) troche 10 mg: 10 mg | ORAL | @ 01:00:00

## 2023-08-18 MED ADMIN — clotrimazole (MYCELEX) troche 10 mg: 10 mg | ORAL | @ 12:00:00 | Stop: 2023-08-18

## 2023-08-18 MED ADMIN — carvedilol (COREG) tablet 6.25 mg: 6.25 mg | ORAL | @ 02:00:00

## 2023-08-18 MED ADMIN — oxyCODONE (ROXICODONE) immediate release tablet 5 mg: 5 mg | ORAL | @ 07:00:00 | Stop: 2023-08-18

## 2023-08-18 MED ADMIN — gabapentin (NEURONTIN) capsule 100 mg: 100 mg | ORAL | @ 19:00:00

## 2023-08-18 MED ADMIN — oxyCODONE (ROXICODONE) immediate release tablet 5 mg: 5 mg | ORAL | @ 20:00:00 | Stop: 2023-09-01

## 2023-08-18 MED ADMIN — furosemide (LASIX) tablet 40 mg: 40 mg | ORAL | @ 20:00:00

## 2023-08-18 MED ADMIN — mirtazapine (REMERON) tablet 7.5 mg: 7.5 mg | ORAL | @ 01:00:00

## 2023-08-18 MED ADMIN — lactulose (CEPHULAC) packet 20 g: 20 g | ORAL | @ 19:00:00

## 2023-08-18 MED ADMIN — gadopiclenol injection 5.8 mL: 5.8 mL | INTRAVENOUS | @ 13:00:00 | Stop: 2023-08-18

## 2023-08-18 MED ADMIN — lidocaine (ASPERCREME) 4 % 1 patch: 1 | TRANSDERMAL | @ 16:00:00

## 2023-08-18 MED ADMIN — magnesium sulfate 2gm/50mL IVPB: 2 g | INTRAVENOUS | @ 20:00:00 | Stop: 2023-08-18

## 2023-08-18 MED ADMIN — clotrimazole (MYCELEX) troche 10 mg: 10 mg | ORAL | @ 16:00:00 | Stop: 2023-08-25

## 2023-08-18 MED ADMIN — lactulose (CEPHULAC) packet 20 g: 20 g | ORAL | @ 13:00:00

## 2023-08-18 MED ADMIN — enoxaparin (LOVENOX) syringe 40 mg: 40 mg | SUBCUTANEOUS | @ 13:00:00

## 2023-08-18 MED ADMIN — carvedilol (COREG) tablet 6.25 mg: 6.25 mg | ORAL | @ 13:00:00

## 2023-08-18 MED ADMIN — clotrimazole (MYCELEX) troche 10 mg: 10 mg | ORAL | @ 06:00:00 | Stop: 2023-08-18

## 2023-08-18 MED ADMIN — rifAXIMin (XIFAXAN) tablet 550 mg: 550 mg | ORAL | @ 12:00:00 | Stop: 2023-08-27

## 2023-08-18 MED ADMIN — magnesium oxide (MAG-OX) tablet 400 mg: 400 mg | ORAL | @ 01:00:00

## 2023-08-18 MED ADMIN — insulin glargine (LANTUS) injection 30 Units: 30 [IU] | SUBCUTANEOUS | @ 02:00:00

## 2023-08-18 MED ADMIN — pantoprazole (Protonix) EC tablet 40 mg: 40 mg | ORAL | @ 13:00:00

## 2023-08-18 MED ADMIN — aspirin chewable tablet 81 mg: 81 mg | ORAL | @ 13:00:00

## 2023-08-18 MED ADMIN — pravastatin (PRAVACHOL) tablet 40 mg: 40 mg | ORAL | @ 02:00:00

## 2023-08-18 MED ADMIN — buPROPion (Wellbutrin XL) 24 hr tablet 150 mg: 150 mg | ORAL | @ 13:00:00

## 2023-08-18 MED ADMIN — insulin lispro (HumaLOG) injection 10 Units: 10 [IU] | SUBCUTANEOUS | @ 19:00:00

## 2023-08-18 MED ADMIN — insulin lispro (HumaLOG) injection 0-20 Units: 0-20 [IU] | SUBCUTANEOUS | @ 14:00:00

## 2023-08-18 MED ADMIN — magnesium oxide (MAG-OX) tablet 400 mg: 400 mg | ORAL | @ 13:00:00

## 2023-08-18 MED ADMIN — clotrimazole (MYCELEX) troche 10 mg: 10 mg | ORAL | @ 19:00:00 | Stop: 2023-08-25

## 2023-08-18 MED ADMIN — lactulose (CEPHULAC) packet 20 g: 20 g | ORAL | @ 02:00:00

## 2023-08-18 MED ADMIN — rifAXIMin (XIFAXAN) tablet 550 mg: 550 mg | ORAL | @ 01:00:00 | Stop: 2023-08-27

## 2023-08-18 MED ADMIN — cefTRIAXone (ROCEPHIN) 2 g in sodium chloride 0.9 % (NS) 100 mL IVPB-MBP: 2 g | INTRAVENOUS | @ 01:00:00 | Stop: 2023-08-21

## 2023-08-18 NOTE — Unmapped (Signed)
Pt seen per a Virtual Admissions request placed by the primary RN.  Pts admission was completed without family at the bedside.  An education assessment and initial education were completed.   Pt advised to use the call bell if they have questions, requests or needs to get out of bed.  Falls education was added to their discharge AVS.  Pt denies having home medications at bedside. A password was declined at this time and a HCDM was confirmed during the admissions questionnaire.  Pt's expected discharge date is currently unknown.    The most recent VS recorded for the Pt are noted below:  BP 132/73  - Pulse 86  - Temp 36.8 ??C (98.2 ??F) (Oral)  - Resp 20  - Ht 162.6 cm (5' 4.02)  - Wt 91.6 kg (202 lb)  - LMP 08/03/2006  - SpO2 96%  - BMI 34.66 kg/m??       The Pt did not complain of pain and denies any needs at this time.

## 2023-08-18 NOTE — Unmapped (Signed)
AXOX4, VSS, new admit to the unit from the ED. Admission assessment completed by admission nurse virtually. Pt c/o abdominal pain and requested for Oxycodon. MD notified and a x1 dose of Oxycodone obtained and administered. Pt had x1 BM. Skin intact but jaundiced.A scatch was noted on the right upper arm that pt stated resulted for a fall  at home. Falls precautions/ bed alarm in place.

## 2023-08-18 NOTE — Unmapped (Signed)
Social Work  Psychosocial Assessment    Patient Name: Ashley Briggs   Medical Record Number: [id number]   Date of Birth: [birth year]  Sex: Female     Per H&P, Ashley Briggs is a 57 y.o. 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    Spoke with patient at bedside she is alert and oriented x4.     Patient expressed that she lives with her cousin. She has family support. Patient was independent with ADL's, with no use of DME. Patient expressed that she will have transportation at discharge.    Patient states that she does not have any preference for [facility], that she would like for her referrals be sent out.     Patient also stated that she does not have any SDOH concerns at this time.     SW will communicate with MD regarding EDD. SW will follow up with discharge dispo.     Referral  Referred by: Care Manager  Reason for Referral: Adjustment to Illness / Diagnosis / Poor Health Literacy    Extended Emergency Contact Information  Primary Emergency Contact: Caudle,Ryan  Mobile Phone: [phone]  Relation: Relative    Legal Next of Kin / Guardian / POA / Advance Directives    HCDM (patient stated preference): Caudle,Ryan - Relative - [phone]    Advance Directive (Medical Treatment)  Does patient have an advance directive covering medical treatment?: Patient does not have advance directive covering medical treatment.  Reason patient does not have an advance directive covering medical treatment:: Patient does not wish to complete one at this time.    Health Care Decision Maker [HCDM] (Medical & Mental Health Treatment)  Healthcare Decision Maker: HCDM documented in the HCDM/Contact Info section. (Caudle,Ryan Relative   [phone])  Information offered on HCDM, Medical & Mental Health advance directives:: Patient declined information.    Advance Directive (Mental Health Treatment)  Does patient have an advance directive covering mental health treatment?: Patient does not have advance directive covering mental health treatment.  Reason patient does not have an advance directive covering mental health treatment:: Patient does not wish to complete one at this time.    Discharge Planning  Discharge Planning Information:   Type of Residence   Mailing Address:  [address]  [place]  [place] [address]    Medical Information   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 2 diabetes mellitus (CMS-HCC) 12/02/2022    Dry eye syndrome, bilateral 12/21/2020    Dysphagia 02/02/2023    Endometrial cancer (CMS-HCC) 2007    Esophageal varices in cirrhosis (CMS-HCC) 06/17/2022    Gastroparesis     Generalized pruritus 02/02/2023    H/O diabetic gastroparesis 01/09/2021    Heart disease  Hemorrhage of rectum and anus 03/17/2011    Hepatic cirrhosis (CMS-HCC) 06/17/2022    Hepatic encephalopathy (CMS-HCC) 06/17/2022    Hepatic steatosis 03/13/2021    Noted on CT Fall 2021    High cholesterol     Hx of psoriasis 08/06/2022    Hyperlipidemia 12/12/2015    Last Assessment & Plan:   The current medical regimen is effective;  continue present plan and medications.    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    Liver disease     Major depression     Morbid obesity (CMS-HCC) 11/10/2019    Myopia of both eyes with astigmatism and presbyopia 12/02/2022    Nausea 09/22/2022    Non-alcoholic fatty liver disease     NPDR (nonproliferative diabetic retinopathy) (CMS-HCC)     Right eye    Other ascites 09/22/2022    Other chronic pain 11/06/2022    Other insomnia 11/06/2022    PFD (pelvic floor dysfunction) 04/07/2013    Portal hypertension (CMS-HCC) 10/08/2022    Psoriasis     Ptosis of eyelid, left     Left upper eyelid    Reflux     Retinopathy of left eye, background, proliferative 01/09/2021    Right upper quadrant abdominal pain 09/22/2022    Secondary esophageal varices without bleeding (CMS-HCC) 06/17/2022    Type 2 diabetes mellitus, with long-term current use of insulin (CMS-HCC) 12/12/2015    Last Assessment & Plan:   The current medical regimen is effective;  continue present plan and medications.  Patient will continue good management of diabetes especially with weight loss. Hopefully with further weight loss will run into the problem of being overmedicated and having to cut back on medications     Some confusion on diabetes medications was have not.been refilled for over a year.  Pa       Past Surgical History:   Procedure Laterality Date    CHOLECYSTECTOMY      HYSTERECTOMY      endometrial cancer    HYSTERECTOMY      OOPHORECTOMY      PR ANAL PRESSURE RECORD Left 03/31/2013    Procedure: ANORECTAL MANOMETRY;  Surgeon: None None;  [facility];  Service: Gastroenterology    PR BREATH HYDROGEN TEST N/A 03/31/2013    Procedure: BREATH HYDROGEN TEST;  Surgeon: None None;  Location: [facility];  Service: Gastroenterology    PR COLSC FLX W/RMVL OF TUMOR POLYP LESION SNARE TQ N/A 09/06/2020    Procedure: COLONOSCOPY FLEX; W/REMOV TUMOR/LES BY SNARE;  Surgeon: Chriss Driver, MD;  Location: [facility];  Service: Gastroenterology    PR UPPER GI ENDOSCOPY,BIOPSY N/A 04/15/2013    Procedure: UGI ENDOSCOPY; WITH BIOPSY, SINGLE OR MULTIPLE;  Surgeon: Vickii Chafe, MD;  Location: [facility];  Service: Gastroenterology    PR UPPER GI ENDOSCOPY,BIOPSY N/A 09/06/2020    Procedure: UGI ENDOSCOPY; WITH BIOPSY, SINGLE OR MULTIPLE;  Surgeon: Chriss Driver, MD;  [facility]: [facility];  Service: Gastroenterology    PR UPPER GI ENDOSCOPY,BIOPSY N/A 05/05/2022    Procedure: UGI ENDOSCOPY; WITH BIOPSY, SINGLE OR MULTIPLE;  Surgeon: Chriss Driver, MD;  [facility]: [facility];  Service: Gastroenterology    PR UPPER GI ENDOSCOPY,BIOPSY N/A 01/22/2023    Procedure: UGI ENDOSCOPY; WITH BIOPSY, SINGLE OR MULTIPLE;  Surgeon: Vonda Antigua, MD;  [facility];  Service: Gastroenterology  Family History   Problem Relation Age of Onset    Diabetes Mother     Cancer Mother     No Known Problems Father     No Known Problems Sister     Diabetes Maternal Grandmother     Hypertension Maternal Grandmother     Cancer Maternal Grandfather     Diabetes Maternal Grandfather     No Known Problems Paternal Grandmother     Skin cancer Paternal Grandfather     Stroke Paternal Grandfather     No Known Problems Brother     No Known Problems Maternal Aunt     No Known Problems Maternal Uncle     No Known Problems Paternal Aunt     No Known Problems Paternal Uncle     No Known Problems Other     Melanoma Neg Hx     Basal cell carcinoma Neg Hx     Squamous cell carcinoma Neg Hx     Substance Abuse Disorder Neg Hx     Alcohol abuse Neg Hx     Drug abuse Neg Hx     Mental illness Neg Hx     Amblyopia Neg Hx     Blindness Neg Hx     Cataracts Neg Hx     Glaucoma Neg Hx     Macular degeneration Neg Hx     Retinal detachment Neg Hx     Strabismus Neg Hx     Thyroid disease Neg Hx     Breast cancer Neg Hx        Financial Information   Primary Insurance: Payor: [place] MGD CAID [employer] [place] / Plan: [place] [employer]GD CAID [employer] [place] / Product Type: *No Product type* /    Secondary Insurance: None   Prescription Coverage: Nurse, learning disability (listed above)   Preferred Pharmacy: [id number] - [place] ([place]), [place] - [address]    Barriers to taking medication: No    Transition Home   Transportation at time of discharge: Family/Friend's Private Vehicle    Anticipated changes related to Illness: none   Services in place prior to admission: N/A   Services anticipated for DC: Home Based Services: Home health   Hemodialysis Prior to Admission: No    Readmission  Risk of Unplanned Readmission Score: UNPLANNED READMISSION SCORE: 21.35%  Readmitted Within the Last 30 Days?   Readmission Factors include: unable to assess    Social Determinants of Health  Social Determinants of Health     Financial Resource Strain: Low Risk  (08/18/2023)    Overall Financial Resource Strain (CARDIA)     Difficulty of Paying Living Expenses: Not very hard   Internet Connectivity: No Internet connectivity concern identified (09/17/2022)    Internet Connectivity     Do you have access to internet services: Yes     How do you connect to the internet: Personal Device at home     Is your internet connection strong enough for you to watch video on your device without major problems?: Yes     Do you have enough data to get through the month?: Yes     Does at least one of the devices have a camera that you can use for video chat?: Yes   Food Insecurity: No Food Insecurity (08/18/2023)    Hunger Vital Sign     Worried About Running Out of Food in the Last Year: Never true  Ran Out of Food in the Last Year: Never true   Tobacco Use: Medium Risk (08/17/2023)    Patient History     Smoking Tobacco Use: Never     Smokeless Tobacco Use: Never     Passive Exposure: Current   Housing/Utilities: Low Risk  (08/18/2023)    Housing/Utilities     Within the past 12 months, have you ever stayed: outside, in a car, in a tent, in an overnight shelter, or temporarily in someone else's home (i.e. couch-surfing)?: No     Are you worried about losing your housing?: No     Within the past 12 months, have you been unable to get utilities (heat, electricity) when it was really needed?: No   Alcohol Use: Not At Risk (01/09/2021)    Alcohol Use     How often do you have a drink containing alcohol?: Monthly or less     How many drinks containing alcohol do you have on a typical day when you are drinking?: 1 - 2     How often do you have 5 or more drinks on one occasion?: Never   Transportation Needs: No Transportation Needs (08/18/2023)    PRAPARE - Transportation     Lack of Transportation (Medical): No     Lack of Transportation (Non-Medical): No   Substance Use: Low Risk  (01/09/2021)    Substance Use     Taken prescription drugs for non-medical reasons: Never     Taken illegal drugs: Never     Patient indicated they have taken drugs in the past year for non-medical reasons: Yes, [positive answer(s)]: Not on file   Health Literacy: Low Risk  (01/09/2021)    Health Literacy     : Never   Physical Activity: Insufficiently Active (01/09/2021)    Exercise Vital Sign     Days of Exercise per Week: 1 day     Minutes of Exercise per Session: 40 min   Interpersonal Safety: Not At Risk (09/17/2022)    Interpersonal Safety     Unsafe Where You Currently Live: No     Physically Hurt by Anyone: No     Abused by Anyone: No   Stress: Stress Concern Present (09/17/2022)    [employer] of Occupational Health - Occupational Stress Questionnaire     Feeling of Stress : Very much   Intimate Partner Violence: Not At Risk (01/09/2021)    Humiliation, Afraid, Rape, and Kick questionnaire     Fear of Current or Ex-Partner: No     Emotionally Abused: No     Physically Abused: No     Sexually Abused: No   Depression: Not at risk (07/12/2021)    PHQ-2     PHQ-2 Score: 0   Social Connections: Moderately Isolated (09/02/2018)    Received from [facility]    Social Connection and Isolation Panel [NHANES]     Frequency of Communication with Friends and Family: Once a week     Frequency of Social Gatherings with Friends and Family: Twice a week     Attends Religious Services: Never     Database administrator or Organizations: No     Attends Banker Meetings: Never     Marital Status: Widowed       Social History  Support Systems/Concerns: Family Members (Caudle,Ryan Relative   [phone])  

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
## 2023-08-19 LAB — COMPREHENSIVE METABOLIC PANEL
ALBUMIN: 2.1 g/dL — ABNORMAL LOW (ref 3.4–5.0)
ALBUMIN: 2.4 g/dL — ABNORMAL LOW (ref 3.4–5.0)
ALKALINE PHOSPHATASE: 354 U/L — ABNORMAL HIGH (ref 46–116)
ALKALINE PHOSPHATASE: 355 U/L — ABNORMAL HIGH (ref 46–116)
ALT (SGPT): 26 U/L (ref 10–49)
ALT (SGPT): 29 U/L (ref 10–49)
ANION GAP: 10 mmol/L (ref 5–14)
ANION GAP: 9 mmol/L (ref 5–14)
AST (SGOT): 56 U/L — ABNORMAL HIGH (ref <=34)
AST (SGOT): 60 U/L — ABNORMAL HIGH (ref <=34)
BILIRUBIN TOTAL: 6.1 mg/dL — ABNORMAL HIGH (ref 0.3–1.2)
BILIRUBIN TOTAL: 6.9 mg/dL — ABNORMAL HIGH (ref 0.3–1.2)
BLOOD UREA NITROGEN: 14 mg/dL (ref 9–23)
BLOOD UREA NITROGEN: 14 mg/dL (ref 9–23)
BUN / CREAT RATIO: 19
BUN / CREAT RATIO: 19
CALCIUM: 9.5 mg/dL (ref 8.7–10.4)
CALCIUM: 10.2 mg/dL (ref 8.7–10.4)
CHLORIDE: 99 mmol/L (ref 98–107)
CHLORIDE: 96 mmol/L — ABNORMAL LOW (ref 98–107)
CO2: 21 mmol/L (ref 20.0–31.0)
CO2: 23 mmol/L (ref 20.0–31.0)
CREATININE: 0.74 mg/dL
CREATININE: 0.75 mg/dL
EGFR CKD-EPI (2021) FEMALE: >90 mL/min/{1.73_m2} (ref >=60)
EGFR CKD-EPI (2021) FEMALE: >90 mL/min/{1.73_m2} (ref >=60)
GLUCOSE RANDOM: 348 mg/dL — ABNORMAL HIGH (ref 70–179)
GLUCOSE RANDOM: 390 mg/dL — ABNORMAL HIGH (ref 70–179)
POTASSIUM: 4.3 mmol/L (ref 3.4–4.8)
POTASSIUM: 4.6 mmol/L (ref 3.4–4.8)
PROTEIN TOTAL: 7 g/dL (ref 5.7–8.2)
PROTEIN TOTAL: 7.8 g/dL (ref 5.7–8.2)
SODIUM: 130 mmol/L — ABNORMAL LOW (ref 135–145)
SODIUM: 128 mmol/L — ABNORMAL LOW (ref 135–145)

## 2023-08-19 LAB — CBC W/ AUTO DIFF
BASOPHILS ABSOLUTE COUNT: 0 10*9/L (ref 0.0–0.1)
BASOPHILS RELATIVE PERCENT: 0.3 %
EOSINOPHILS ABSOLUTE COUNT: 0.2 10*9/L (ref 0.0–0.5)
EOSINOPHILS RELATIVE PERCENT: 4.1 %
HEMATOCRIT: 29.1 % — ABNORMAL LOW (ref 34.0–44.0)
HEMOGLOBIN: 9.4 g/dL — ABNORMAL LOW (ref 11.3–14.9)
LYMPHOCYTES ABSOLUTE COUNT: 0.7 10*9/L — ABNORMAL LOW (ref 1.1–3.6)
LYMPHOCYTES RELATIVE PERCENT: 14.4 %
MEAN CORPUSCULAR HEMOGLOBIN CONC: 32.3 g/dL (ref 32.0–36.0)
MEAN CORPUSCULAR HEMOGLOBIN: 25.8 pg — ABNORMAL LOW (ref 25.9–32.4)
MEAN CORPUSCULAR VOLUME: 79.9 fL (ref 77.6–95.7)
MEAN PLATELET VOLUME: 8.3 fL (ref 6.8–10.7)
MONOCYTES ABSOLUTE COUNT: 0.6 10*9/L (ref 0.3–0.8)
MONOCYTES RELATIVE PERCENT: 12.6 %
NEUTROPHILS ABSOLUTE COUNT: 3.2 10*9/L (ref 1.8–7.8)
NEUTROPHILS RELATIVE PERCENT: 68.6 %
PLATELET COUNT: 123 10*9/L — ABNORMAL LOW (ref 150–450)
RED BLOOD CELL COUNT: 3.63 10*12/L — ABNORMAL LOW (ref 3.95–5.13)
RED CELL DISTRIBUTION WIDTH: 21.6 % — ABNORMAL HIGH (ref 12.2–15.2)
WBC ADJUSTED: 4.7 10*9/L (ref 3.6–11.2)

## 2023-08-19 LAB — MAGNESIUM: MAGNESIUM: 1.6 mg/dL (ref 1.6–2.6)

## 2023-08-19 LAB — PROTIME-INR
INR: 1.25
INR: 1.24
PROTIME: 13.9 s — ABNORMAL HIGH (ref 9.9–12.6)
PROTIME: 13.8 s — ABNORMAL HIGH (ref 9.9–12.6)

## 2023-08-19 MED ADMIN — promethazine (PHENERGAN) 12.5 mg in sodium chloride (NS) 0.9 % 50 mL IVPB: 12.5 mg | INTRAVENOUS | @ 15:00:00

## 2023-08-19 MED ADMIN — insulin lispro (HumaLOG) injection 0-20 Units: 0-20 [IU] | SUBCUTANEOUS

## 2023-08-19 MED ADMIN — insulin glargine (LANTUS) injection 50 Units: 50 [IU] | SUBCUTANEOUS | @ 01:00:00

## 2023-08-19 MED ADMIN — pravastatin (PRAVACHOL) tablet 40 mg: 40 mg | ORAL

## 2023-08-19 MED ADMIN — pantoprazole (Protonix) EC tablet 40 mg: 40 mg | ORAL | @ 12:00:00

## 2023-08-19 MED ADMIN — gabapentin (NEURONTIN) capsule 100 mg: 100 mg | ORAL | @ 18:00:00 | Stop: 2023-08-19

## 2023-08-19 MED ADMIN — rifAXIMin (XIFAXAN) tablet 550 mg: 550 mg | ORAL | Stop: 2023-08-27

## 2023-08-19 MED ADMIN — oxyCODONE (ROXICODONE) immediate release tablet 5 mg: 5 mg | ORAL | @ 09:00:00 | Stop: 2023-09-01

## 2023-08-19 MED ADMIN — oxyCODONE (ROXICODONE) immediate release tablet 5 mg: 5 mg | ORAL | @ 22:00:00 | Stop: 2023-09-01

## 2023-08-19 MED ADMIN — clotrimazole (MYCELEX) troche 10 mg: 10 mg | ORAL | @ 21:00:00 | Stop: 2023-08-25

## 2023-08-19 MED ADMIN — lactulose (CEPHULAC) packet 20 g: 20 g | ORAL

## 2023-08-19 MED ADMIN — spironolactone (ALDACTONE) tablet 100 mg: 100 mg | ORAL | @ 21:00:00

## 2023-08-19 MED ADMIN — lactulose (CEPHULAC) packet 20 g: 20 g | ORAL | @ 12:00:00

## 2023-08-19 MED ADMIN — oxyCODONE (ROXICODONE) immediate release tablet 5 mg: 5 mg | ORAL | @ 18:00:00 | Stop: 2023-09-01

## 2023-08-19 MED ADMIN — rifAXIMin (XIFAXAN) tablet 550 mg: 550 mg | ORAL | @ 12:00:00 | Stop: 2023-08-27

## 2023-08-19 MED ADMIN — insulin lispro (HumaLOG) injection 10 Units: 10 [IU] | SUBCUTANEOUS | @ 19:00:00

## 2023-08-19 MED ADMIN — insulin lispro (HumaLOG) injection 0-20 Units: 0-20 [IU] | SUBCUTANEOUS | @ 18:00:00

## 2023-08-19 MED ADMIN — mirtazapine (REMERON) tablet 7.5 mg: 7.5 mg | ORAL

## 2023-08-19 MED ADMIN — insulin lispro (HumaLOG) injection 10 Units: 10 [IU] | SUBCUTANEOUS | @ 13:00:00

## 2023-08-19 MED ADMIN — acetaminophen (TYLENOL) tablet 650 mg: 650 mg | ORAL | @ 15:00:00

## 2023-08-19 MED ADMIN — furosemide (LASIX) tablet 40 mg: 40 mg | ORAL | @ 18:00:00

## 2023-08-19 MED ADMIN — clotrimazole (MYCELEX) troche 10 mg: 10 mg | ORAL | @ 05:00:00 | Stop: 2023-08-25

## 2023-08-19 MED ADMIN — metFORMIN (GLUCOPHAGE-XR) 24 hr tablet 1,000 mg: 1000 mg | ORAL | @ 21:00:00

## 2023-08-19 MED ADMIN — metFORMIN (GLUCOPHAGE-XR) 24 hr tablet 1,000 mg: 1000 mg | ORAL | @ 12:00:00

## 2023-08-19 MED ADMIN — enoxaparin (LOVENOX) syringe 40 mg: 40 mg | SUBCUTANEOUS | @ 12:00:00

## 2023-08-19 MED ADMIN — gabapentin (NEURONTIN) capsule 100 mg: 100 mg | ORAL

## 2023-08-19 MED ADMIN — iohexol (OMNIPAQUE) 350 mg iodine/mL solution 75 mL: 75 mL | INTRAVENOUS | @ 17:00:00 | Stop: 2023-08-19

## 2023-08-19 MED ADMIN — clotrimazole (MYCELEX) troche 10 mg: 10 mg | ORAL | @ 18:00:00 | Stop: 2023-08-25

## 2023-08-19 MED ADMIN — clotrimazole (MYCELEX) troche 10 mg: 10 mg | ORAL | @ 12:00:00 | Stop: 2023-08-25

## 2023-08-19 MED ADMIN — oxyCODONE (ROXICODONE) immediate release tablet 5 mg: 5 mg | ORAL | @ 13:00:00 | Stop: 2023-09-01

## 2023-08-19 MED ADMIN — furosemide (LASIX) tablet 40 mg: 40 mg | ORAL | @ 09:00:00

## 2023-08-19 MED ADMIN — clotrimazole (MYCELEX) troche 10 mg: 10 mg | ORAL | @ 19:00:00 | Stop: 2023-08-25

## 2023-08-19 MED ADMIN — oxyCODONE (ROXICODONE) immediate release tablet 5 mg: 5 mg | ORAL | @ 03:00:00 | Stop: 2023-09-01

## 2023-08-19 MED ADMIN — buPROPion (Wellbutrin XL) 24 hr tablet 150 mg: 150 mg | ORAL | @ 12:00:00

## 2023-08-19 MED ADMIN — pantoprazole (Protonix) EC tablet 40 mg: 40 mg | ORAL

## 2023-08-19 MED ADMIN — aspirin chewable tablet 81 mg: 81 mg | ORAL | @ 12:00:00

## 2023-08-19 MED ADMIN — insulin lispro (HumaLOG) injection 0-20 Units: 0-20 [IU] | SUBCUTANEOUS | @ 12:00:00

## 2023-08-19 MED ADMIN — senna (SENOKOT) tablet 2 tablet: 2 | ORAL

## 2023-08-19 MED ADMIN — magnesium oxide (MAG-OX) tablet 400 mg: 400 mg | ORAL

## 2023-08-19 MED ADMIN — insulin lispro (HumaLOG) injection 0-20 Units: 0-20 [IU] | SUBCUTANEOUS | @ 21:00:00

## 2023-08-19 MED ADMIN — magnesium oxide (MAG-OX) tablet 400 mg: 400 mg | ORAL | @ 12:00:00

## 2023-08-19 MED ADMIN — ciprofloxacin HCl (CIPRO) tablet 500 mg: 500 mg | ORAL | @ 21:00:00 | Stop: 2023-08-25

## 2023-08-19 MED ADMIN — lactulose (CEPHULAC) packet 20 g: 20 g | ORAL | @ 18:00:00

## 2023-08-19 MED ADMIN — spironolactone (ALDACTONE) tablet 100 mg: 100 mg | ORAL | @ 12:00:00

## 2023-08-19 MED ADMIN — gabapentin (NEURONTIN) capsule 100 mg: 100 mg | ORAL | @ 12:00:00 | Stop: 2023-08-19

## 2023-08-19 MED ADMIN — ciprofloxacin HCl (CIPRO) tablet 500 mg: 500 mg | ORAL | @ 03:00:00 | Stop: 2023-08-25
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
## 2023-08-19 NOTE — Unmapped (Signed)
Pt alert and oriented, VSS. Given PRN oxy with positive effect. Stand by assist to bathroom. Up in chair for meals.   Problem: Fall Injury Risk  Goal: Absence of Fall and Fall-Related Injury  Outcome: Ongoing - Unchanged  Intervention: Promote Scientist, clinical (histocompatibility and immunogenetics) Documentation  Taken 08/18/2023 1600 by Lamount Cranker, RN  Safety Interventions: fall reduction program maintained  Taken 08/18/2023 1400 by Lamount Cranker, RN  Safety Interventions: fall reduction program maintained  Taken 08/18/2023 1200 by Lamount Cranker, RN  Safety Interventions: bed alarm  Taken 08/18/2023 1000 by Lamount Cranker, RN  Safety Interventions: bed alarm  Taken 08/18/2023 0800 by Lamount Cranker, RN  Safety Interventions: bed alarm     Problem: Adult Inpatient Plan of Care  Goal: Plan of Care Review  Outcome: Ongoing - Unchanged  Goal: Patient-Specific Goal (Individualized)  Outcome: Ongoing - Unchanged  Goal: Absence of Hospital-Acquired Illness or Injury  Outcome: Ongoing - Unchanged  Intervention: Identify and Manage Fall Risk  Recent Flowsheet Documentation  Taken 08/18/2023 1600 by Lamount Cranker, RN  Safety Interventions: fall reduction program maintained  Taken 08/18/2023 1400 by Lamount Cranker, RN  Safety Interventions: fall reduction program maintained  Taken 08/18/2023 1200 by Lamount Cranker, RN  Safety Interventions: bed alarm  Taken 08/18/2023 1000 by Lamount Cranker, RN  Safety Interventions: bed alarm  Taken 08/18/2023 0800 by Lamount Cranker, RN  Safety Interventions: bed alarm  Intervention: Prevent Skin Injury  Recent Flowsheet Documentation  Taken 08/18/2023 0845 by Lamount Cranker, RN  Positioning for Skin: Supine/Back  Skin Protection: incontinence pads utilized  Taken 08/18/2023 0800 by Lamount Cranker, RN  Positioning for Skin: Supine/Back  Skin Protection: adhesive use limited  Intervention: Prevent Infection  Recent Flowsheet Documentation  Taken 08/18/2023 1600 by Lamount Cranker, RN  Infection Prevention: hand hygiene promoted  Taken 08/18/2023 1400 by Lamount Cranker, RN  Infection Prevention: hand hygiene promoted  Taken 08/18/2023 1200 by Lamount Cranker, RN  Infection Prevention: hand hygiene promoted  Taken 08/18/2023 1000 by Lamount Cranker, RN  Infection Prevention: hand hygiene promoted  Taken 08/18/2023 0800 by Lamount Cranker, RN  Infection Prevention: hand hygiene promoted  Goal: Optimal Comfort and Wellbeing  Outcome: Ongoing - Unchanged  Goal: Readiness for Transition of Care  Outcome: Ongoing - Unchanged  Goal: Rounds/Family Conference  Outcome: Ongoing - Unchanged     Problem: Pain Acute  Goal: Optimal Pain Control and Function  Outcome: Ongoing - Unchanged     Problem: Comorbidity Management  Goal: Blood Glucose Levels Within Targeted Range  Outcome: Ongoing - Unchanged  Goal: Blood Pressure in Desired Range  Outcome: Ongoing - Unchanged     Problem: Obstructive Sleep Apnea Risk or Actual  Goal: Unobstructed Breathing During Sleep  Outcome: Ongoing - Unchanged

## 2023-08-19 NOTE — Unmapped (Signed)
Problem: Fall Injury Risk  Goal: Absence of Fall and Fall-Related Injury  Outcome: Progressing  Intervention: Promote Scientist, clinical (histocompatibility and immunogenetics) Documentation  Taken 08/18/2023 2000 by Sherlon Handing, RN  Safety Interventions:   commode/urinal/bedpan at bedside   fall reduction program maintained   lighting adjusted for tasks/safety   low bed   nonskid shoes/slippers when out of bed     Problem: Fall Injury Risk  Goal: Absence of Fall and Fall-Related Injury  Intervention: Promote Injury-Free Environment  Recent Flowsheet Documentation  Taken 08/18/2023 2000 by Sherlon Handing, RN  Safety Interventions:   commode/urinal/bedpan at bedside   fall reduction program maintained   lighting adjusted for tasks/safety   low bed   nonskid shoes/slippers when out of bed     Problem: Adult Inpatient Plan of Care  Goal: Plan of Care Review  Outcome: Progressing  Goal: Patient-Specific Goal (Individualized)  Outcome: Progressing  Goal: Absence of Hospital-Acquired Illness or Injury  Outcome: Progressing  Intervention: Identify and Manage Fall Risk  Recent Flowsheet Documentation  Taken 08/18/2023 2000 by Sherlon Handing, RN  Safety Interventions:   commode/urinal/bedpan at bedside   fall reduction program maintained   lighting adjusted for tasks/safety   low bed   nonskid shoes/slippers when out of bed  Intervention: Prevent Skin Injury  Recent Flowsheet Documentation  Taken 08/18/2023 2000 by Sherlon Handing, RN  Positioning for Skin: Supine/Back  Skin Protection: incontinence pads utilized  Intervention: Prevent Infection  Recent Flowsheet Documentation  Taken 08/18/2023 2000 by Sherlon Handing, RN  Infection Prevention: hand hygiene promoted  Goal: Optimal Comfort and Wellbeing  Outcome: Progressing  Goal: Readiness for Transition of Care  Outcome: Progressing  Goal: Rounds/Family Conference  Outcome: Progressing     Problem: Adult Inpatient Plan of Care  Goal: Absence of Hospital-Acquired Illness or Injury  Intervention: Prevent Skin Injury  Recent Flowsheet Documentation  Taken 08/18/2023 2000 by Sherlon Handing, RN  Positioning for Skin: Supine/Back  Skin Protection: incontinence pads utilized     Problem: Pain Acute  Goal: Optimal Pain Control and Function  Outcome: Progressing     Problem: Comorbidity Management  Goal: Blood Glucose Levels Within Targeted Range  Outcome: Progressing  Goal: Blood Pressure in Desired Range  Outcome: Progressing     Problem: Obstructive Sleep Apnea Risk or Actual  Goal: Unobstructed Breathing During Sleep  Outcome: Progressing   AXOX4, VSS, C/O abdominal pain that was relieved by ordered pain medication. Pt ambulates to the bathroom independently. Continues with PO  Cipro and Mycelex with no adverse drug reaction noted. Cipro was re timed  2 hours away for magnesium as ordered. Pt also took lactulose and reported having bowel movement, was unable to asses since pat did not want to use bedside commode that was offered instead she preferred to go to the bathroom. Falls precautions in place.

## 2023-08-20 DIAGNOSIS — E119 Type 2 diabetes mellitus without complications: Principal | ICD-10-CM

## 2023-08-20 LAB — COMPREHENSIVE METABOLIC PANEL
ALBUMIN: 2.2 g/dL — ABNORMAL LOW (ref 3.4–5.0)
ALKALINE PHOSPHATASE: 364 U/L — ABNORMAL HIGH (ref 46–116)
ALT (SGPT): 26 U/L (ref 10–49)
ANION GAP: 9 mmol/L (ref 5–14)
AST (SGOT): 51 U/L — ABNORMAL HIGH (ref <=34)
BILIRUBIN TOTAL: 6.5 mg/dL — ABNORMAL HIGH (ref 0.3–1.2)
BLOOD UREA NITROGEN: 13 mg/dL (ref 9–23)
BUN / CREAT RATIO: 19
CALCIUM: 10.1 mg/dL (ref 8.7–10.4)
CHLORIDE: 100 mmol/L (ref 98–107)
CO2: 21 mmol/L (ref 20.0–31.0)
CREATININE: 0.7 mg/dL
EGFR CKD-EPI (2021) FEMALE: >90 mL/min/{1.73_m2} (ref >=60)
GLUCOSE RANDOM: 243 mg/dL — ABNORMAL HIGH (ref 70–179)
POTASSIUM: 4.7 mmol/L (ref 3.4–4.8)
PROTEIN TOTAL: 7.3 g/dL (ref 5.7–8.2)
SODIUM: 130 mmol/L — ABNORMAL LOW (ref 135–145)

## 2023-08-20 LAB — CBC W/ AUTO DIFF
BASOPHILS ABSOLUTE COUNT: 0 10*9/L (ref 0.0–0.1)
BASOPHILS RELATIVE PERCENT: 0.2 %
EOSINOPHILS ABSOLUTE COUNT: 0.2 10*9/L (ref 0.0–0.5)
EOSINOPHILS RELATIVE PERCENT: 4 %
HEMATOCRIT: 30.1 % — ABNORMAL LOW (ref 34.0–44.0)
HEMOGLOBIN: 9.7 g/dL — ABNORMAL LOW (ref 11.3–14.9)
LYMPHOCYTES ABSOLUTE COUNT: 0.7 10*9/L — ABNORMAL LOW (ref 1.1–3.6)
LYMPHOCYTES RELATIVE PERCENT: 14.4 %
MEAN CORPUSCULAR HEMOGLOBIN CONC: 32.1 g/dL (ref 32.0–36.0)
MEAN CORPUSCULAR HEMOGLOBIN: 25.8 pg — ABNORMAL LOW (ref 25.9–32.4)
MEAN CORPUSCULAR VOLUME: 80.4 fL (ref 77.6–95.7)
MEAN PLATELET VOLUME: 8.6 fL (ref 6.8–10.7)
MONOCYTES ABSOLUTE COUNT: 0.6 10*9/L (ref 0.3–0.8)
MONOCYTES RELATIVE PERCENT: 11.9 %
NEUTROPHILS ABSOLUTE COUNT: 3.5 10*9/L (ref 1.8–7.8)
NEUTROPHILS RELATIVE PERCENT: 69.5 %
PLATELET COUNT: 125 10*9/L — ABNORMAL LOW (ref 150–450)
RED BLOOD CELL COUNT: 3.74 10*12/L — ABNORMAL LOW (ref 3.95–5.13)
RED CELL DISTRIBUTION WIDTH: 21.2 % — ABNORMAL HIGH (ref 12.2–15.2)
WBC ADJUSTED: 5.1 10*9/L (ref 3.6–11.2)

## 2023-08-20 LAB — MAGNESIUM
MAGNESIUM: 1.1 mg/dL — ABNORMAL LOW (ref 1.6–2.6)
MAGNESIUM: 1.9 mg/dL (ref 1.6–2.6)

## 2023-08-20 LAB — PROTIME-INR
INR: 1.3
PROTIME: 14.5 s — ABNORMAL HIGH (ref 9.9–12.6)

## 2023-08-20 MED ORDER — MAGNESIUM OXIDE 400 MG (241.3 MG MAGNESIUM) TABLET
ORAL_TABLET | Freq: Two times a day (BID) | ORAL | 11 refills | 30 days | Status: CP
Start: 2023-08-20 — End: 2024-08-19
  Filled 2023-08-20: qty 120, 30d supply, fill #0

## 2023-08-20 MED ORDER — OXYCODONE 5 MG TABLET
ORAL_TABLET | ORAL | 0 refills | 5 days | Status: CP | PRN
Start: 2023-08-20 — End: 2023-08-25
  Filled 2023-08-20: qty 30, 5d supply, fill #0

## 2023-08-20 MED ORDER — CLOTRIMAZOLE 10 MG TROCHE
ORAL_TABLET | ORAL | 0 refills | 4 days | Status: CP
Start: 2023-08-20 — End: ?
  Filled 2023-08-20: qty 20, 4d supply, fill #0

## 2023-08-20 MED ORDER — CIPROFLOXACIN 500 MG TABLET
ORAL_TABLET | Freq: Two times a day (BID) | ORAL | 0 refills | 3 days | Status: CP
Start: 2023-08-20 — End: ?
  Filled 2023-08-20: qty 5, 3d supply, fill #0

## 2023-08-20 MED ORDER — GABAPENTIN 300 MG CAPSULE
ORAL_CAPSULE | Freq: Three times a day (TID) | ORAL | 0 refills | 30 days | Status: CP
Start: 2023-08-20 — End: 2023-09-19
  Filled 2023-08-20: qty 90, 30d supply, fill #0

## 2023-08-20 MED ADMIN — magnesium oxide (MAG-OX) tablet 400 mg: 400 mg | ORAL | @ 13:00:00 | Stop: 2023-08-20

## 2023-08-20 MED ADMIN — rifAXIMin (XIFAXAN) tablet 550 mg: 550 mg | ORAL | @ 01:00:00 | Stop: 2023-08-27

## 2023-08-20 MED ADMIN — spironolactone (ALDACTONE) tablet 100 mg: 100 mg | ORAL | @ 14:00:00 | Stop: 2023-08-20

## 2023-08-20 MED ADMIN — mirtazapine (REMERON) tablet 7.5 mg: 7.5 mg | ORAL | @ 01:00:00

## 2023-08-20 MED ADMIN — insulin lispro (HumaLOG) injection 0-20 Units: 0-20 [IU] | SUBCUTANEOUS | @ 01:00:00

## 2023-08-20 MED ADMIN — senna (SENOKOT) tablet 2 tablet: 2 | ORAL | @ 01:00:00

## 2023-08-20 MED ADMIN — promethazine (PHENERGAN) 12.5 mg in sodium chloride (NS) 0.9 % 50 mL IVPB: 12.5 mg | INTRAVENOUS | @ 17:00:00 | Stop: 2023-08-20

## 2023-08-20 MED ADMIN — pantoprazole (Protonix) EC tablet 40 mg: 40 mg | ORAL | @ 13:00:00 | Stop: 2023-08-20

## 2023-08-20 MED ADMIN — insulin glargine (LANTUS) injection 50 Units: 50 [IU] | SUBCUTANEOUS | @ 01:00:00

## 2023-08-20 MED ADMIN — magnesium sulfate 2gm/50mL IVPB: 2 g | INTRAVENOUS | @ 12:00:00 | Stop: 2023-08-20

## 2023-08-20 MED ADMIN — gabapentin (NEURONTIN) capsule 300 mg: 300 mg | ORAL | @ 01:00:00

## 2023-08-20 MED ADMIN — clotrimazole (MYCELEX) troche 10 mg: 10 mg | ORAL | @ 01:00:00 | Stop: 2023-08-25

## 2023-08-20 MED ADMIN — lactulose (CEPHULAC) packet 20 g: 20 g | ORAL | @ 01:00:00

## 2023-08-20 MED ADMIN — rifAXIMin (XIFAXAN) tablet 550 mg: 550 mg | ORAL | @ 12:00:00 | Stop: 2023-08-20

## 2023-08-20 MED ADMIN — buPROPion (Wellbutrin XL) 24 hr tablet 150 mg: 150 mg | ORAL | @ 13:00:00 | Stop: 2023-08-20

## 2023-08-20 MED ADMIN — lactulose (CEPHULAC) packet 20 g: 20 g | ORAL | @ 13:00:00 | Stop: 2023-08-20

## 2023-08-20 MED ADMIN — enoxaparin (LOVENOX) syringe 40 mg: 40 mg | SUBCUTANEOUS | @ 13:00:00 | Stop: 2023-08-20

## 2023-08-20 MED ADMIN — magnesium oxide (MAG-OX) tablet 400 mg: 400 mg | ORAL | @ 01:00:00

## 2023-08-20 MED ADMIN — carvedilol (COREG) tablet 6.25 mg: 6.25 mg | ORAL | @ 12:00:00 | Stop: 2023-08-20

## 2023-08-20 MED ADMIN — clotrimazole (MYCELEX) troche 10 mg: 10 mg | ORAL | @ 12:00:00 | Stop: 2023-08-20

## 2023-08-20 MED ADMIN — pravastatin (PRAVACHOL) tablet 40 mg: 40 mg | ORAL | @ 01:00:00

## 2023-08-20 MED ADMIN — ciprofloxacin HCl (CIPRO) tablet 500 mg: 500 mg | ORAL | @ 10:00:00 | Stop: 2023-08-20

## 2023-08-20 MED ADMIN — gabapentin (NEURONTIN) capsule 300 mg: 300 mg | ORAL | @ 17:00:00 | Stop: 2023-08-20

## 2023-08-20 MED ADMIN — oxyCODONE (ROXICODONE) immediate release tablet 5 mg: 5 mg | ORAL | @ 13:00:00 | Stop: 2023-08-20

## 2023-08-20 MED ADMIN — oxyCODONE (ROXICODONE) immediate release tablet 5 mg: 5 mg | ORAL | @ 02:00:00 | Stop: 2023-09-01

## 2023-08-20 MED ADMIN — insulin lispro (HumaLOG) injection 0-20 Units: 0-20 [IU] | SUBCUTANEOUS | @ 12:00:00 | Stop: 2023-08-20

## 2023-08-20 MED ADMIN — oxyCODONE (ROXICODONE) immediate release tablet 5 mg: 5 mg | ORAL | @ 17:00:00 | Stop: 2023-08-20

## 2023-08-20 MED ADMIN — carvedilol (COREG) tablet 6.25 mg: 6.25 mg | ORAL | @ 01:00:00

## 2023-08-20 MED ADMIN — gabapentin (NEURONTIN) capsule 300 mg: 300 mg | ORAL | @ 13:00:00 | Stop: 2023-08-20

## 2023-08-20 MED ADMIN — magnesium sulfate 2gm/50mL IVPB: 2 g | INTRAVENOUS | @ 14:00:00 | Stop: 2023-08-20

## 2023-08-20 MED ADMIN — lactulose (CEPHULAC) packet 20 g: 20 g | ORAL | @ 17:00:00 | Stop: 2023-08-20

## 2023-08-20 MED ADMIN — aspirin chewable tablet 81 mg: 81 mg | ORAL | @ 13:00:00 | Stop: 2023-08-20

## 2023-08-20 MED ADMIN — furosemide (LASIX) tablet 40 mg: 40 mg | ORAL | @ 17:00:00 | Stop: 2023-08-20

## 2023-08-20 MED ADMIN — furosemide (LASIX) tablet 40 mg: 40 mg | ORAL | @ 10:00:00 | Stop: 2023-08-20

## 2023-08-20 MED ADMIN — metFORMIN (GLUCOPHAGE-XR) 24 hr tablet 1,000 mg: 1000 mg | ORAL | @ 12:00:00 | Stop: 2023-08-20

## 2023-08-20 MED ADMIN — pantoprazole (Protonix) EC tablet 40 mg: 40 mg | ORAL | @ 01:00:00

## 2023-08-20 MED ADMIN — clotrimazole (MYCELEX) troche 10 mg: 10 mg | ORAL | @ 17:00:00 | Stop: 2023-08-20

## 2023-08-20 NOTE — Unmapped (Signed)
Physician Discharge Summary [facility]  1 [facility] OBSERVATION [facility]  [address]  [place] [address]  Dept: [phone]  Loc: [phone]     Identifying Information:   Ashley Briggs  1966-08-05  [id number]    Primary Care Physician: Karie Georges Pap, DO     Code Status: Full Code    Admit Date: 08/16/2023    Discharge Date: 08/20/2023     Discharge To: Home with Home Health and/or PT/OT    Discharge Service: [facility] - General Medicine Floor Team MED L - Tower     Discharge Attending Physician: Earna Coder, MD    Discharge Diagnoses:   Principal Problem:    Left upper quadrant pain (POA: Unknown)  Active Problems:    Type 2 diabetes mellitus, with long-term current use of insulin (CMS-HCC) (POA: Not Applicable)    Coronary artery disease involving native heart without angina pectoris (POA: Yes)    Hepatic cirrhosis (CMS-HCC) (POA: Yes)    Secondary esophageal varices without bleeding (CMS-HCC) (POA: Yes)    Calcified cerebral meningioma (CMS-HCC) (POA: Yes)    Current moderate episode of major depressive disorder (CMS-HCC) (POA: Yes)    OSA (obstructive sleep apnea) (POA: Yes)  Resolved Problems:    * No resolved hospital problems. 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 Her hospital course is detailed below:    Abdominal Pain:   Pain in the left upper quadrant that has worsened over the past week. Her WBC is normal and she has been afebrile, and has had no prior episodes of SBP (positive ascitic fluid culture in May was believed to be contaminant).  ED attempted diagnostic paracentesis but no apparent fluid pocket to safely perform the procedure. Will consult medicine procedure service to evaluate again in the morning.  In the interim, will cover for SBP.  The patient's pain is very much localized to the LUQ and seems to be worsened by palpation of enlarged spleen.  CT abdomen pelvis showed splenomegaly with hypoattenuating lesions grossly unchanged from prior but with multiple hypoattenuating lesions.  MRI showed mild increase in size and number of splenic hemangiomas seen prior. CTA chest did not reveal PE or pleural effusion but did note remote R sided rib fractures. Unable to determine cause of pain but likely MSK given significant workup was all negative. Pain was managed with oxycodone and gabapentin. Empirically treated for SBP with ciprofloxacin. Will follow up with hepatology and PCP outpatient.       Decompensated NASH Cirrhosis:  Complicated by portal hypertension, grade II esophageal varices and portal hypertensive gastropathy, ascites and episodes of hepatic encephalopathy.  Followed by [facility] Hepatology, last seen in February 2024 with discussion of live donor liver transplantation at that time, pending further weight loss.  Since that appointment was admitted to [facility] in May 2024 and has yet to follow up with GI again, had an appointment scheduled for 9/3 but cancelled due to transportation issues. Hepatology was consulted upon arrival and will follow outpatient.    Hypoattenuating Lesions in Liver:  Noted on CT abdomen/pelvis, increased since prior study approximately 9 months ago.  Could represent regenerative nodules vs dysplastic nodules vs HCC. Follow up MRI revealed interval increase in size  and number of presumed hemangiomas. Will follow up with Hepatology outpatient.    Hypercalcemia - Hypomagnesemia:  Calcium 11 on initial chemistry, ionized calcium of 5.52  PTH 17.3. Was hypomagnesemic on admission. Electrolytes repleted daily.    Urinary Tract Infection:   UA with large nitrite and 95 WBC/hpf. Patient reports no dysuria, frequency or urgency. May be asymptomatic bacteriuria but was treated empirically for SBP.    Acute Kidney Injury:   Mild elevation of serum creatinine from baseline ~0.5 to ~0.8  May be secondary to potential urinary tract infection or prerenal from overdiuresis/dehydration.  Does have decompensated cirrhosis so potentially could have some element of hepatorenal but I feel this is less likely as renal function has previously been very stable. Cr 0.7 on discharge.    Type II Diabetes:  Followed by [facility] endocrinology.  Currently in the process of receiving a dexcom, but for now is checking glucose once daily or with symptoms. Infrequent hypoglycemia episodes.  Home regimen includes lantus 50 units nightly, lispro 10 units TID with meals, metformin, trulicity and invokana. Increased towards home dose insulin as PO intake improved. Towards end of hospitalization required increase to 15 lispro with meals.    Hypertension:   Blood pressure normal range on admission, continued on home cavedilol.    Coronary Artery Disease:   Continued home carvedilol and aspirin on admission.    Calcified Cerebral Meningioma - Headaches:    Known small meningioma. Follows with [facility] neurosurgery and neurology.  Experiences headaches and dizziness, unclear if related to the lesion or simply migraines. Annual MRI performed for surveillance, and has remained stable in size. Continued on home medications.    Plaque Psoriasis:   Takes monthly secukinumab, last taken in the past week.     Anxiety/ Depression:   Continued home mirtazapine.           Outpatient Provider Follow Up Issues:   Pain management  Insulin regimen    Touchbase with Outpatient Provider:  Warm Handoff: Completed on 08/20/23 by Berlin Hun, MD  (Intern) via [facility] Message    Procedures:  None  ______________________________________________________________________  Discharge Medications:      Your Medication List        START taking these medications      ciprofloxacin HCl 500 MG tablet  Commonly known as: CIPRO  Take 1 tablet (500 mg total) by mouth two (2) times a day.     clotrimazole 10 mg troche  Commonly known as: MYCELEX  Dissolve 1 tablet (10 mg total) in mouth five (5) times a day.     gabapentin 300 MG capsule  Commonly known as: NEURONTIN  Take 1 capsule (300 mg total) by mouth Three (3) times a day.     lidocaine 4 % patch  Commonly known as: ASPERCREME  Place 1 patch on the skin daily. Apply for 12 hours then remove for 12 hours.  Start taking on: August 21, 2023     magnesium oxide 400 mg (241.3 mg elemental) tablet  Commonly known as: MAG-OX  Take 2 tablets (800 mg total) by mouth two (2) times a day.     oxyCODONE 5 MG immediate release tablet  Commonly known as: ROXICODONE  Take 1 tablet (5 mg total) by mouth every four (4) hours as needed for pain.            CHANGE how you take these medications      DEXCOM G7 SENSOR Devi  Generic drug: blood-glucose sensor  Use to monitor blood glucose levels continuously. Change sensor every 10 days.  What changed: additional instructions            CONTINUE taking these medications      ACCU-CHEK GUIDE TEST STRIPS Strp  Generic drug: blood sugar diagnostic  Test two (2) times a day (30 minutes before a meal).     ACCU-CHEK SOFTCLIX LANCETS lancets  Generic drug: lancets  Use as directed to test once daily.     acetaminophen 325 MG tablet  Commonly known as: TYLENOL  Take 2 tablets (650 mg total) by mouth two (2) times a day as needed for pain.     aspirin 81 MG tablet  Commonly known as: ECOTRIN  Take 1 tablet (81 mg total) by mouth daily.     baclofen 5 mg Tab tablet  Commonly known as: LIORESAL  Take 1 tablet (5 mg total) by mouth nightly.     buPROPion 150 MG 24 hr tablet  Commonly known as: Wellbutrin XL  Take 1 tablet (150 mg total) by mouth daily.     canagliflozin 300 mg Tab tablet  Commonly known as: INVOKANA  Take 1 tablet (300 mg total) by mouth daily before breakfast.     carvedilol 6.25 MG tablet  Commonly known as: COREG  Take 1 tablet (6.25 mg total) by mouth two (2) times a day. chlorhexidine 0.12 % solution  Commonly known as: PERIDEX  15 mL by Mouth route daily.     cholestyramine light 4 gram packet  Generic drug: cholestyramine-aspartame  Mix 1 packet as directed and take by mouth daily.     COSENTYX PEN (2 PENS) 150 mg/mL Pnij injection  Generic drug: secukinumab  Inject the contents of 2 pens (300 mg total) under the skin every twenty-eight (28) days. Maintenance dose     DEXCOM G7 RECEIVER Misc  Generic drug: blood-glucose meter,continuous  Use as instructed with G7 sensors.     furosemide 40 MG tablet  Commonly known as: LASIX  Take 1 tablet (40 mg total) by mouth two (2) times a day.     insulin lispro 100 unit/mL injection pen  Commonly known as: HumaLOG  Inject 10 Units under the skin Three (3) times a day before meals.     lactulose 10 gram/15 mL solution  Take 30 mL (20 g total) by mouth Three (3) times a day.     LANTUS SOLOSTAR U-100 INSULIN 100 unit/mL (3 mL) injection pen  Generic drug: insulin glargine  Inject 0.5 mL (50 Units total) under the skin nightly. Inject as the units of insulin as directed by your doctor once daily around same time every day.  Total daily dose not to exceed 50 units.     metFORMIN 500 MG 24 hr tablet  Commonly known as: GLUCOPHAGE-XR  Take 2 tablets (1,000 mg total) by mouth in the morning and 2 tablets (1,000 mg total) in the evening. Take with meals.     mirtazapine 7.5 MG tablet  Commonly known as: REMERON  Take 1 tablet (7.5 mg total) by mouth.     pantoprazole 40 MG tablet  Commonly known as: Protonix  Take 1 tablet (40 mg total) by mouth two (2) times a day.     pen needle, diabetic 32 gauge x 1/4 (6 mm) Ndle  Commonly known as: ULTICARE PEN NEEDLE  Inject 1 each under the skin nightly.     promethazine 12.5 MG tablet  Commonly known as: PHENERGAN  Take  1 tablet (12.5 mg total) by mouth every eight (8) hours as needed for nausea.     rifAXIMin 550 mg Tab  Commonly known as: XIFAXAN  Take 1 tablet (550 mg total) by mouth two (2) times a day.     simvastatin 20 MG tablet  Commonly known as: ZOCOR  Take 1 tablet (20 mg total) by mouth every evening.     SLOW FE 142 mg (45 mg iron) Tber  Generic drug: ferrous sulfate  Take 1 tablet (142 mg) by mouth two (2) times a day.     spironolactone 100 MG tablet  Commonly known as: ALDACTONE  Take 1 tablet (100 mg total) by mouth two (2) times a day.     TRULICITY 1.5 mg/0.5 mL Pnij  Generic drug: dulaglutide  Inject 0.5 mL (1.5 mg total) under the skin every seven (7) days.     zolpidem 5 MG tablet  Commonly known as: AMBIEN  Take 1 tablet (5 mg total) by mouth nightly as needed.              Allergies:  Patient has no known allergies.  ______________________________________________________________________  Pending Test Results:  Pending Labs       Order Current Status    Magnesium Level In process    Blood Culture #1 Preliminary result    Blood Culture #2 Preliminary result            Most Recent Labs:  All lab results last 24 hours -   Recent Results (from the past 24 hour(s))   Comprehensive Metabolic Panel    Collection Time: 08/19/23  3:04 PM   Result Value Ref Range    Sodium 128 (L) 135 - 145 mmol/L    Potassium 4.6 3.4 - 4.8 mmol/L    Chloride 96 (L) 98 - 107 mmol/L    CO2 23.0 20.0 - 31.0 mmol/L    Anion Gap 9 5 - 14 mmol/L    BUN 14 9 - 23 mg/dL    Creatinine 9.60 4.54 - 1.02 mg/dL    BUN/Creatinine Ratio 19     eGFR CKD-EPI (2021) Female >90 >=60 mL/min/1.29m2    Glucose 390 (H) 70 - 179 mg/dL    Calcium 09.8 8.7 - 11.9 mg/dL    Albumin 2.4 (L) 3.4 - 5.0 g/dL    Total Protein 7.8 5.7 - 8.2 g/dL    Total Bilirubin 6.9 (H) 0.3 - 1.2 mg/dL    AST 60 (H) <=14 U/L    ALT 29 10 - 49 U/L    Alkaline Phosphatase 355 (H) 46 - 116 U/L   PT-INR    Collection Time: 08/19/23  3:04 PM   Result Value Ref Range    PT 13.8 (H) 9.9 - 12.6 sec    INR 1.24    POCT Glucose    Collection Time: 08/19/23  5:21 PM   Result Value Ref Range    Glucose, POC 317 (H) 70 - 179 mg/dL   POCT Glucose    Collection Time: 08/19/23 8:17 PM   Result Value Ref Range    Glucose, POC 303 (H) 70 - 179 mg/dL   Comprehensive Metabolic Panel    Collection Time: 08/20/23  4:02 AM   Result Value Ref Range    Sodium 130 (L) 135 - 145 mmol/L    Potassium 4.7 3.4 - 4.8 mmol/L    Chloride 100 98 - 107 mmol/L    CO2 21.0 20.0 - 31.0 mmol/L  Anion Gap 9 5 - 14 mmol/L    BUN 13 9 - 23 mg/dL    Creatinine 1.61 0.96 - 1.02 mg/dL    BUN/Creatinine Ratio 19     eGFR CKD-EPI (2021) Female >90 >=60 mL/min/1.96m2    Glucose 243 (H) 70 - 179 mg/dL    Calcium 04.5 8.7 - 40.9 mg/dL    Albumin 2.2 (L) 3.4 - 5.0 g/dL    Total Protein 7.3 5.7 - 8.2 g/dL    Total Bilirubin 6.5 (H) 0.3 - 1.2 mg/dL    AST 51 (H) <=81 U/L    ALT 26 10 - 49 U/L    Alkaline Phosphatase 364 (H) 46 - 116 U/L   PT-INR    Collection Time: 08/20/23  4:02 AM   Result Value Ref Range    PT 14.5 (H) 9.9 - 12.6 sec    INR 1.30    Magnesium Level    Collection Time: 08/20/23  4:02 AM   Result Value Ref Range    Magnesium 1.1 (L) 1.6 - 2.6 mg/dL   CBC w/ Differential    Collection Time: 08/20/23  4:02 AM   Result Value Ref Range    WBC 5.1 3.6 - 11.2 10*9/L    RBC 3.74 (L) 3.95 - 5.13 10*12/L    HGB 9.7 (L) 11.3 - 14.9 g/dL    HCT 19.1 (L) 47.8 - 44.0 %    MCV 80.4 77.6 - 95.7 fL    MCH 25.8 (L) 25.9 - 32.4 pg    MCHC 32.1 32.0 - 36.0 g/dL    RDW 29.5 (H) 62.1 - 15.2 %    MPV 8.6 6.8 - 10.7 fL    Platelet 125 (L) 150 - 450 10*9/L    Neutrophils % 69.5 %    Lymphocytes % 14.4 %    Monocytes % 11.9 %    Eosinophils % 4.0 %    Basophils % 0.2 %    Absolute Neutrophils 3.5 1.8 - 7.8 10*9/L    Absolute Lymphocytes 0.7 (L) 1.1 - 3.6 10*9/L    Absolute Monocytes 0.6 0.3 - 0.8 10*9/L    Absolute Eosinophils 0.2 0.0 - 0.5 10*9/L    Absolute Basophils 0.0 0.0 - 0.1 10*9/L    Anisocytosis Moderate (A) Not Present    Hypochromasia Moderate (A) Not Present   POCT Glucose    Collection Time: 08/20/23  7:28 AM   Result Value Ref Range    Glucose, POC 223 (H) 70 - 179 mg/dL   POCT Glucose    Collection Time: 08/20/23 11:05 AM   Result Value Ref Range    Glucose, POC 179 70 - 179 mg/dL       Relevant Studies/Radiology:  CTA Chest W Contrast    Result Date: 08/19/2023  EXAM: CTA CHEST W CONTRAST ACCESSION: [id number] UN CLINICAL INDICATION: pleuritis, c/f PE, rib fracture TECHNIQUE: Contiguous axial images were reconstructed following CT scanning the chest (excluding the apices as per protocol) during the administration of intravenous contrast. Additional images generated include contiguous images in the sagittal and coronal planes as well as multiplanar MIP slabs. The examination was performed with limited z-axis coverage and reduced kVp to reduce breath hold, improve vascular opacification/contrast, and reduce radiation dose. COMPARISON: Chest radiograph 04/15/2023 FINDINGS: PULMONARY ARTERIES: No emboli in either lung. HEART AND VASCULATURE: Heart is enlarged. Heavy coronary artery calcifications. No pericardial effusion. Normal caliber aorta with mild calcifications. LUNGS, AIRWAYS, AND PLEURA: Bibasilar scarring/atelectasis. No focal consolidation. Central airways are patent.  No pleural effusion or pneumothorax. MEDIASTINUM AND LYMPH NODES: No enlarged intrathoracic, axillary, or supraclavicular lymph nodes. No mediastinal mass or other abnormality. CHEST WALL AND BONES: - Likely remote fractures involving the anterior aspect of the seventh and eighth ribs on the right. - No acute displaced rib fracture. - Mild degenerative changes of the thoracic spine. UPPER ABDOMEN: Cirrhotic liver morphology and ascites, better evaluated on recent MRI of the abdomen.     1. No findings of thromboembolic disease or other acute pulmonary process 2. No accumulation of gas or fluid within the pleural spaces 3. Likely remote fractures involving the anterior aspect of the seventh and eighth ribs on the right. However, correlate with physical exam. 4. Calcified atherosclerotic disease is evident within the coronary arterial distribution.     MRI Abdomen W Wo Contrast    Result Date: 08/18/2023  EXAM: MRI ABDOMEN W WO CONTRAST ACCESSION: [id number] UN CLINICAL INDICATION: 57 years old with LUQ pain with splenic and hepatic lesions  COMPARISON: Liver ultrasound 08/17/2023, CT abdomen pelvis 08/16/2023, MRI abdomen 06/26/2022 TECHNIQUE: MRI of the abdomen was obtained with and without IV contrast. Multisequence, multiplanar images were obtained.  CONTRAST: 5.8 mL of Elucirem (Gadopiclenol) FINDINGS: LINES/DEVICES: None. LOWER CHEST: Unremarkable. ABDOMEN/PELVIS: HEPATOBILIARY: The liver is cirrhotic with a nodular contour and left hepatic hypertrophy. No suspicious hepatic lesions are identified. The gallbladder is surgically absent. No biliary ductal dilatation. PANCREAS: Unremarkable. SPLEEN: Splenomegaly measuring up to 17.4 cm. There are innumerable 1-2 cm splenic lesions with central T2 hyperintensity and peripheral T2 hypointensity. These lesions are T1 hyperintense on precontrast imaging, and T1 hypointense on arterial phase imaging, with progressive enhancement on delayed phase imaging with persistent enhancement at 5 minutes. ADRENAL GLANDS: Unremarkable. KIDNEYS/URETERS: Unremarkable. BOWEL/PERITONEUM/RETROPERITONEUM: No bowel obstruction. No acute inflammatory process. No ascites. VASCULATURE: Abdominal aorta within normal limits for patient's age. Portal and hepatic veins are patent.  Unremarkable inferior vena cava. LYMPH NODES: Redemonstrated approximately 1.3 cm periportal lymph node (series 28 image 47). BONES/SOFT TISSUES: Unremarkable. OTHER: Small volume upper abdominal ascites. Incidentally noted trace volume left pleural effusion.     -Redemonstrated innumerable probable splenic hemangiomas, with slight increase in size and number when compared with prior MRI dated 06/26/2022. No suspicious features. -Hepatic cirrhosis. No suspicious hepatic lesions. -Other chronic/incidental findings as described. ___________________________________________________________ Linus Galas 5 = Definitely hepatocellular carcinoma (concordant with OPTN 5) LI-RADS 4 = Probably hepatocellular carcinoma LI-RADS 3 = Indeterminate LI-RADS 2 = Probably benign LI-RADS 1 = Definitely benign LI-RADS M= Probably or definitely malignant, not necessarily HCC NOTE:  LI-RADS is only validated in patients with the following risk factors: Cirrhosis, chronic hepatitis B or current/prior hepatocellular carcinoma. If the patient does not have these risk factors, LI-RADS classification may not be applied. LI-RADS is not applicable in patients receiving systemic therapy. The LI-RADS / OPTN classification of liver lesions has been adopted to standardize CT and MRI scan reporting in patients at risk for hepatocellular carcinoma. The imaging criteria for definite hepatocellular carcinoma are concordant for the LI-RADS and OPTN systems. LI-RADS criteria and documentation are available online at [email or web address].  This report utilizes LI-RADS version 2018     US Liver Doppler    Result Date: 08/17/2023  EXAM: US LIVER DOPPLER ACCESSION: [id number] UN CLINICAL INDICATION: 57 years old with abdominal pain, known cirrhosis, assess portal venous flow  COMPARISON: 08/16/2023 CT abdomen and pelvis TECHNIQUE: Ultrasound views of the liver vasculature were obtained using grayscale, color Doppler, and spectral Doppler analysis FINDINGS: PORTAL VEIN: The main, left and right  portal vein are patent with hepatopetal flow. Normal main portal vein velocity (0.20 m/s or greater). Sluggish left portal vein identified.      Main portal vein diameter: 1.3 cm      Main portal vein velocity: 0.22 m/s      Anterior right portal vein velocity: 0.14 m/s      Posterior right portal vein velocity: 0.16 m/s      Left portal vein velocity: 0.07 m/s      Main portal vein flow: hepatopetal      Right portal vein flow: hepatopetal      Left portal vein flow: hepatopetal SPLENIC VEIN: Patent, with hepatopetal flow.      Splenic vein midline: hepatopetal      Splenic vein proximal: hepatopetal HEPATIC VEINS/IVC: The IVC, left, middle and right hepatic veins were were patent.      Left hepatic vein phasicity/flow: biphasic      Middle hepatic vein phasicity/flow: mono-bi      Right hepatic vein phasicity/flow: biphasic      Inferior vena cava phasicity/flow: mono-bi HEPATIC ARTERY: Patent with color and spectral Doppler imaging      Common hepatic artery: Patent OTHER: Small volume abdominal ascites.     -- Patent hepatic vasculature with normal flow direction. Sluggish left portal vein velocity. -- Small-volume ascites, seen on recent CT     ECG 12 Lead    Result Date: 08/16/2023  SINUS RHYTHM WITH PREMATURE SUPRAVENTRICULAR BEATS INFERIOR INFARCT (CITED ON OR BEFORE 13-Sep-2022) ANTERIOR INFARCT  (CITED ON OR BEFORE 30-Jun-2019) ABNORMAL ECG WHEN COMPARED WITH ECG OF 15-Apr-2023 09:38, PR INTERVAL HAS DECREASED Confirmed by Manuella Ghazi [phone]) on 08/16/2023 5:19:13 PM    CT Abdomen Pelvis W IV Contrast Only    Result Date: 08/16/2023  EXAM: CT ABDOMEN PELVIS W CONTRAST ACCESSION: [id number] UN CLINICAL INDICATION: 57 years old with abdominal pain  COMPARISON: CT abdomen pelvis 12/19/2022 TECHNIQUE: A helical CT scan of the abdomen and pelvis was obtained following IV contrast from the lung bases through the pubic symphysis. Images were reconstructed in the axial plane. Coronal and sagittal reformatted images were also provided for further evaluation. FINDINGS: LOWER CHEST: Subsegmental bibasilar atelectasis. LIVER: Cirrhotic liver morphology with numerous hypoattenuating lesions throughout the liver, increased compared to prior CT. BILIARY: The gallbladder is surgically absent. No biliary ductal dilatation.  SPLEEN: Splenomegaly measuring 17.5 cm in craniocaudal dimension, previously measuring 19.5 cm. Numerous hypoattenuating lesions throughout the spleen, grossly similar to prior. PANCREAS: Normal pancreatic contour.  No focal lesions.  No ductal dilation. ADRENAL GLANDS: Normal appearance of the adrenal glands. KIDNEYS/URETERS: Symmetric renal enhancement.  No hydronephrosis.  No solid renal mass. BLADDER: Unremarkable. REPRODUCTIVE ORGANS: The uterus is surgically absent. GI TRACT: Note is made of esophageal/paraesophageal varices. The stomach appears unremarkable. The loops of small bowel are normal in caliber. No evidence of acute colonic pathology. The appendix is nondilated. PERITONEUM, RETROPERITONEUM AND MESENTERY: No free air. Small to moderate volume ascites, increased since prior. Note is made of areas of peritoneal thickening (series 2#[id number] & series 4#64). LYMPH NODES: 1.2 cm porta hepatis lymph node (2:45), previously measuring 1.7 cm. VESSELS: Hepatic and portal veins are patent. Calcified atherosclerotic disease of the aorta, branch vessels and coronary arteries. BONES and SOFT TISSUES: Degenerative changes of the spine. Diffuse body wall edema. 2.0 cm collection in the left perianal/labial soft tissues with mild rim enhancement (2:165), new since prior. This likely represents a Bartholin gland cyst.     - Cirrhotic liver with  stigmata of portal hypertension including splenomegaly and interval increased small volume ascites. Since 12/19/2022, there are also increased numerous hypoattenuating lesions throughout the liver, which could represent regenerative nodules. However, dysplastic nodules or hepatocellular carcinoma cannot be excluded. Further evaluation with nonemergent/outpatient MRI is recommended. -Peritoneal thickening in the left abdomen which can be seen with peritonitis in the correct clinical setting. - 2.0 cm Bartholin gland cyst with mild peripheral enhancement, new since prior. Cannot exclude superimposed infection. -Additional findings, as described in the body the report. FOLLOW-UP RECOMMENDATION: Item for Follow Up: 1. Acuity: Non-urgent 2. Modality: MR 3. Anatomy: Abdomen 4. TimeFrame: 1 month    ______________________________________________________________________  Discharge Instructions:   Activity Instructions       Activity as tolerated                       Follow Up instructions and Outpatient Referrals     Ambulatory Referral to Home Health      Reason for referral: PT/OT, MSW    Physician to follow patient's care: PCP    Disciplines requested:  Physical Therapy  Occupational Therapy  Medical Social Work       Physical Therapy requested: Evaluate and treat    Occupational Therapy Requested: Evaluate and treat    Medical Social Work requested: General area of patient need    General area of patient need: Assitance with food/financial resources    Call MD for:  difficulty breathing, headache or visual disturbances      Call MD for:  persistent nausea or vomiting      Call MD for:  severe uncontrolled pain      Call MD for:  temperature >38.5 Celsius      Discharge instructions          Appointments which have been scheduled for you      Aug 26, 2023 2:00 PM  (Arrive by 1:35 PM)  RETURN NEUROLOGY with Arizona Constable, ANP  [facility] [place] [address] [address] [facility]) [address]  [place] [address]  [phone]        Sep 04, 2023 10:40 AM  (Arrive by 10:25 AM)  RETURN  ENDOCRINE with Jacqualine Code, MD  [place] [facility] REGION) [address] [place] [address]  [place] [address]  [phone]        Sep 09, 2023 12:00 PM  (Arrive by 11:45 AM)  MRI BRAIN W WO CONTRAST    -UN with [facility] MRI MBL  IM[facility] MRI [facility] [place]) [address]  [place] [address]  [phone]   On appt date:  Bring recent lab work  Bring documentation of any metal object implants  Take meds as usual  Check w/physician if diabetic  You will be asked to change into a gown for your safety    On appt date do not:  No restrictions on food/drink  Wear metallic items including jewelry (we are not responsible for lost items)    Let us know if pt:  Claustrophobic  Metal object implant  Pregnant  Prescribed a sedative  On dialysis  Allergic to MRI dye/contrast  Kidney Failure    (Title:[id number])         Sep 16, 2023 10:15 AM  (Arrive by 10:00 AM)  US ABDOMEN COMPLETE with ICB [place] RM 1  IMG ULTRASOUND [facility] [facility] [place] [address]  [address]  [place] [address]  [phone]   Preparing for the appointment:  Fasting before the appointment  You must refrain from eating or drinking prior to the appointment. However, if you are taking medications please take them as usual on the day of the procedure with a small sip of water.  Before the appointment  62-36 years old: Do not eat or drink anything for three (3) hours prior to the study.  5 years and older: Do not eat or drink anything for six (6) hours prior to the study.         Nov 02, 2023 8:30 AM  (Arrive by 8:15 AM)  NEW  ESO with Concord Sever, NP  [facility] GI MEDICINE [place] [facility] REGION) [address]  [facility] 1 through 4  [place] [address]  [phone]        Dec 22, 2023 10:30 AM  (Arrive by 10:00 AM)  RETURN  HEPATOLOGY with Kennedy Bucker, MD  [facility] LIVER TRANSPLANT [facility] REGION) [address]  [place] [address]  [phone]             ______________________________________________________________________  Discharge Day Services:  BP 122/67  - Pulse 89  - Temp 36.8 ??C (98.2 ??F) (Oral)  - Resp 16  - Ht 162.6 cm (5' 4.02)  - Wt 91.6 kg (202 lb)  - LMP 08/03/2006  - SpO2 98%  - BMI 34.66 kg/m??     Pt seen on the day of discharge and determined appropriate for discharge.    Condition at Discharge: fair    Length of Discharge: I spent greater than 30 mins in the discharge of this patient.

## 2023-08-20 NOTE — Unmapped (Signed)
Pt. A&Ox4 remained free of falls and injury for the duration of my shift. Pt. Call bell within reach, room free of clutter, bed in lowest position, and bed alarmed for safety. Pt. Pain remains at best 8/10 with PRN pain medications. Provider aware and making adjustments to gabapentin. Pt. Stable for the duration of my shift and handed off to the night RN for continuity of care.     Problem: Fall Injury Risk  Goal: Absence of Fall and Fall-Related Injury  Outcome: Progressing     Problem: Adult Inpatient Plan of Care  Goal: Plan of Care Review  Outcome: Progressing  Goal: Patient-Specific Goal (Individualized)  Outcome: Progressing  Goal: Absence of Hospital-Acquired Illness or Injury  Outcome: Progressing  Goal: Optimal Comfort and Wellbeing  Outcome: Progressing  Goal: Readiness for Transition of Care  Outcome: Progressing  Goal: Rounds/Family Conference  Outcome: Progressing     Problem: Pain Acute  Goal: Optimal Pain Control and Function  Outcome: Progressing     Problem: Comorbidity Management  Goal: Blood Glucose Levels Within Targeted Range  Outcome: Progressing  Goal: Blood Pressure in Desired Range  Outcome: Progressing     Problem: Obstructive Sleep Apnea Risk or Actual  Goal: Unobstructed Breathing During Sleep  Outcome: Progressing

## 2023-08-20 NOTE — Unmapped (Signed)
Problem: Fall Injury Risk  Goal: Absence of Fall and Fall-Related Injury  Outcome: Resolved     Problem: Adult Inpatient Plan of Care  Goal: Plan of Care Review  Outcome: Resolved  Goal: Patient-Specific Goal (Individualized)  Outcome: Resolved  Goal: Absence of Hospital-Acquired Illness or Injury  Outcome: Resolved  Goal: Optimal Comfort and Wellbeing  Outcome: Resolved  Goal: Readiness for Transition of Care  Outcome: Resolved  Goal: Rounds/Family Conference  Outcome: Resolved     Problem: Pain Acute  Goal: Optimal Pain Control and Function  Outcome: Resolved     Problem: Comorbidity Management  Goal: Blood Glucose Levels Within Targeted Range  Outcome: Resolved  Goal: Blood Pressure in Desired Range  Outcome: Resolved     Problem: Obstructive Sleep Apnea Risk or Actual  Goal: Unobstructed Breathing During Sleep  Outcome: Resolved  D C education done at bedside, will be D C home today.

## 2023-08-20 NOTE — Unmapped (Signed)
Pt. A&Ox 4 remained free of falls & injury for the duration of my shift. Pt. Received 4g of Mag IV. Pt. Stable and discharged successfully to home via wheelchair accompanied by hospital staff. Pt. PIV removed. Discharge paperwork provided and all questions answered.   Problem: Fall Injury Risk  Goal: Absence of Fall and Fall-Related Injury  Outcome: Progressing     Problem: Adult Inpatient Plan of Care  Goal: Plan of Care Review  Outcome: Progressing  Goal: Patient-Specific Goal (Individualized)  Outcome: Progressing  Goal: Absence of Hospital-Acquired Illness or Injury  Outcome: Progressing  Goal: Optimal Comfort and Wellbeing  Outcome: Progressing  Goal: Readiness for Transition of Care  Outcome: Progressing  Goal: Rounds/Family Conference  Outcome: Progressing     Problem: Pain Acute  Goal: Optimal Pain Control and Function  Outcome: Progressing     Problem: Comorbidity Management  Goal: Blood Glucose Levels Within Targeted Range  Outcome: Progressing  Goal: Blood Pressure in Desired Range  Outcome: Progressing     Problem: Obstructive Sleep Apnea Risk or Actual  Goal: Unobstructed Breathing During Sleep  Outcome: Progressing

## 2023-08-21 ENCOUNTER — Inpatient Hospital Stay: Admit: 2023-08-21 | Payer: PRIVATE HEALTH INSURANCE

## 2023-08-21 MED ORDER — LIDOCAINE 4 % TOPICAL PATCH
MEDICATED_PATCH | Freq: Every day | TRANSDERMAL | 0 refills | 15 days | Status: CP
Start: 2023-08-21 — End: ?

## 2023-08-24 NOTE — Unmapped (Signed)
The patient is requesting for the Front Desk to contact them in regards to hospital admission follow-up appointment. Patient was discharged on 9/20. The first available appt is > 14 days out.   Please contact The patient by Cell Phone

## 2023-08-24 NOTE — Unmapped (Signed)
[facility] Specialty Medication Onboarding    Specialty Medication: TRULICITY 1.5 mg/0.5 mL Pnij (dulaglutide)  Prior Authorization: Approved   Financial Assistance: No - copay  <$25  Final Copay/Day Supply: $4 / 28    Insurance Restrictions: None     Notes to Pharmacist: new to therapy  Credit Card on File: yes    The triage team has completed the benefits investigation and has determined that the patient is able to fill this medication at [facility]. Please contact the patient to complete the onboarding or follow up with the prescribing physician as needed.

## 2023-08-25 NOTE — Unmapped (Signed)
Scheduler contacted pt for PT SOC. No answer, Left VM(s)

## 2023-08-26 ENCOUNTER — Ambulatory Visit: Admit: 2023-08-26 | Discharge: 2023-08-27 | Payer: PRIVATE HEALTH INSURANCE

## 2023-08-26 DIAGNOSIS — G44221 Chronic tension-type headache, intractable: Principal | ICD-10-CM

## 2023-08-26 MED ADMIN — lidocaine (XYLOCAINE) 10 mg/mL (1 %) injection 1 mL: 1 mL | @ 19:00:00 | Stop: 2023-08-26

## 2023-08-26 NOTE — Unmapped (Addendum)
It was a pleasure to see you today.     Start baclofen 5mg  nightly.    We did trigger point injections today.     Trigger Point Patient Instructions:    Thank you very much for allowing me to take part in your medical care! Today you received trigger point injections with lidocaine. The following is an outline of potential expectations:     Do NOT massage injection areas.    Temporary soreness and numbness over the site of injection is normal. However, if you experience severe pain, or develop moderate to severe swelling, contact my office immediately or make your way to the nearest emergency room for evaluation.     You can develop infection at the injection site. After several days, if you develop drainage, warmth, redness and swelling at the site, please contact my office.     You may have small raised areas at the injection sites, these should go away after 7 days.    Potential adverse effects of trigger point injections include but not limited to bleeding, infection, worsening of the pain, damage to the area being injected, weakness, allergic reaction to medication, vascular injection, and nerve damage.     Trigger point injections may reduce some or all of your pain. But the pain can come back after the medicine wears off. You may need several series of injections before you see longer lasting relief and benefit.    Do the attached neck stretching exercises 3x per day.          Neck: Exercises  Introduction  Here are some examples of exercises for you to try. The exercises may be suggested for a condition or for rehabilitation. Start each exercise slowly. Ease off the exercises if you start to have pain.  You will be told when to start these exercises and which ones will work best for you.  How to do the exercises  Neck stretch to the side (upper trap stretch)    Sit in a firm chair, or stand up straight. Keep your shoulder down as you lean away from it. To help you remember to do this, start by relaxing your shoulders and lightly holding on to your thighs or your chair.  Look straight ahead. Tilt your head toward one shoulder and hold for 15 to 30 seconds. Relax and let the weight of your head stretch your muscles.  Slowly return your head to the starting position.  Repeat 2 to 4 times toward each shoulder.  If you would like a little added stretch, place your arm behind your back. Use the arm opposite of the direction you are tilting your head. For example, if you are tilting your head to the left, place your right arm behind your back.  You can also add more stretch by using one hand to pull your head toward your shoulder. For example, keeping your right shoulder down, lean your head to the left and use your left hand to gently and steadily pull your head toward your shoulder.  Neck stretch to the diagonal    Sit in a firm chair, or stand up straight. Look straight ahead. If you're standing, keep your feet about hip-width apart.  Turn your head slightly toward the direction you will be stretching. Tip your head diagonally, bringing your chin toward your chest. Relax and let the weight of your head stretch your muscles. Hold this position for 15 to 30 seconds.  If you would like a little added stretch,  use your hand to gently and steadily pull your head forward on the diagonal. For example, to stretch toward the left, use your left hand.  Repeat 2 to 4 times.  It's a good idea to repeat these steps toward the other side.  Dorsal glide stretch    Sit up straight in a firm chair, or stand up straight. If you're standing, keep your feet about hip-width apart.  Keep your neck straight, and look straight ahead.  Slowly tuck your chin as you glide your head backward over your body.  Hold for a count of 6, and then relax for up to 10 seconds.  Repeat 2 to 4 times.  Chest and shoulder stretch    Sit or stand tall. You can also stand against a wall. Glide your head backward over your body (dorsal glide).  Raise both arms with your palms facing forward and your elbows out to the sides.  Bend your elbows and slowly lower your arms while squeezing your shoulder blades down and back. Keep your elbows back and your hands up throughout the exercise. If you're using a wall, keep your arms and hands against the wall. You will feel your shoulder blades slide down and together. At the same time, you will feel a stretch across your chest and the front of your shoulders.  Hold for about 6 seconds. Then relax for up to 10 seconds.  Repeat 8 to 12 times.  Strengthening: Hands on head    Move your head backward, forward, and side to side against gentle pressure from your hands, holding each position for about 6 seconds.  Repeat 8 to 12 times.  Follow-up care is a key part of your treatment and safety. Be sure to make and go to all appointments, and call your doctor if you are having problems. It's also a good idea to know your test results and keep a list of the medicines you take.  Current as of: June 17, 2022               Content Version: 13.9  ?? 2006-2023 [employer].   Care instructions adapted under license by [facility]. If you have questions about a medical condition or this instruction, always ask your healthcare professional. [employer] disclaims any warranty or liability for your use of this information.      Follow up: Return in about 4 weeks (around 09/23/2023) for In-person.     If you have any questions in between visits, please send a message via MyChart or you can call our office. MyChart and the Clinic Triage are for non-urgent medical questions. If you are experiencing a medical emergency, please proceed to the closest Emergency Department.     Viviano Simas MSN, APRN, AGPCNP-BC  Nurse Practitioner, General Neurology  [facility] Department of Neurology  Phone: [phone]  Fax: [phone]

## 2023-08-26 NOTE — Unmapped (Signed)
[facility] Neurology Clinic Summary       [address]  [facility] NEUROLOGY CLINIC [address] [place]  [address]  [place] [address]  [phone]    Date: 08/26/2023  Patient Name: Ashley Briggs  MRN: [id number]  PCP: Karie Georges Pap  Mangel, Benison Pap, DO            Ashley Briggs is a 57 y.o.  female seen at the [place] [facility] for follow up.     Assessment & Plan:   Assessment     Ashley Briggs is a 57 y.o.  female seen for the following problems:     Diagnosis ICD-10-CM Associated Orders   1. Chronic tension-type headache, intractable  G44.221 lidocaine (XYLOCAINE) 10 mg/mL (1 %) injection 1 mL          Headache: Patient with chronic headache, noting headache is daily.  This is at her whole head, and is intractable.  This is rated 8-10/10 and is described as pressure.  She notes that the pressure feeling will build up to a point where it feels like her head is going to explode.  There is no aura.  There is no light or sound sensitivity.  There is some smell sensitivity with strong food smells.  There is nausea with occasional vomiting.  There are no vision changes, tinnitus, autonomic signs.  Nothing is relieving or aggravating.  She reports 30/30 headache days per month with 20/30 that are debilitating.  She is currently taking gabapentin.  She has tried and failed Tylenol.  She does note a superimposed headache that can come and go.  This is described as a very painful spot in 1 area of her head, which can vary.  This lasts for about an hour.    DDX:  Chronic tension type headache given pain is described as pressure and is without migrainous features.  Possible superimposed migraine given other headache can come and go and is more intense.    PLAN:  Trigger point injections for suspected tension headache. See separate procedure note.   Start baclofen 5mg  nightly.      Imbalance: Patient with reports of imbalance today, noting that she will list to 1 side while walking.  She denies any overt dizzy or spinning sensations.  She denies any sensory loss in her feet.  She has this checked regularly given her history of undercontrolled diabetes.  On exam, sensation to light touch, pinprick, vibration is intact.  Gait is normal and fluid with full strength throughout.  Romberg is negative.  Reflexes are 1+ throughout.  Tandem walk is abnormal.    DDX:  Possible BPPV but this is less likely given lack of spinning sensation.  Possible early large fiber neuropathy, but this is less likely given lack of sensory findings on exam.    PLAN:  Physical therapy for balance training.      Follow Up:  1 months    Thank you very much for this consultation.    Sincerely,  Viviano Simas NP    Note forwarded to Dr. Lia Foyer for review.     CC: Mangel, Benison Pap, DO         I personally spent 27 minutes face-to-face and non-face-to-face in the care of this patient, which includes all pre, intra, and post visit time on the date of service.  All documented time was specific to the E/M  visit and does not include any procedures that may have been performed.     Portions of this record have been created using Scientist, clinical (histocompatibility and immunogenetics). Dictation errors have been sought, but may not have been identified and corrected.      Subjective:   Subjective        Obtained history from the patient      HPI: Patient is a 57 y.o. female seen at the request of Mangel, Benison Pap, DO. PMH notable for DM, HLD, HTN, cataract, retinopathy, CAD, hepatic steatosis, hepatic cirrhosis, secondary esophageal varices, OSA, headache, here today for follow up on headache.     Interval History:  Has not started baclofen yet. [facility] did not have this in stock.     Has appointment with PT, has not started just yet. Was seen in hospital.     Prior History 07/2023:  Still daily. Still having pressure build up, feeling like it's going to explode. Will have occasional very painful spot on head (area varies) that lasts about an hour. Headache is never gone.     Notes some imbalance as well, listing to one side. No dizziness or spinning. No sensory loss in feet per patient.     HA 1 - located whole head, intractable  Pain 8-10/10, described as pressure  Aura: No  Photophobia: No  Phonophobia: No  Osmophobia: Yes, describe: food smells  Nausea/Vomiting: Yes, describe: with occasional vomiting  Vision changes: No  Unilateral tearing, facial flushing, ptosis, weakness, paresthesia: No  Tinnitus: No  Relieving factors No  Aggravating factors: No     Current HA 1 days/month: 30  HA 1 days that are debilitating: 20    Current Medications:   gabapentin    Failed Medications:  Tylenol  Gabapentin        Past Medical Hx, Family Hx, Social Hx, and Problem List has been reviewed in the [employer].        REVIEW OF SYSTEMS: pertinent items noted in HPI          Objective:       Physical Exam:  Blood pressure 115/75, pulse 89, resp. rate 18, height 162.6 cm (5' 4), weight 100.2 kg (221 lb), last menstrual period 08/03/2006, not currently breastfeeding.       Constitutional:       Appearance: Normal appearance. Well-developed and well-groomed.   Eyes:      Extraocular Movements: EOM normal.      Pupils: Pupils are equal, round, and reactive to light.   Neurological:      Mental Status: Oriented to person, place, and time.      Coordination: Romberg Test normal.      Gait: Tandem walk abnormal.      Deep Tendon Reflexes:      Reflex Scores:       Tricep reflexes are 1+ on the right side and 1+ on the left side.       Bicep reflexes are 1+ on the right side and 1+ on the left side.       Brachioradialis reflexes are 1+ on the right side and 1+ on the left side.       Patellar reflexes are 1+ on the right side and 1+ on the left side.  Psychiatric:         Attention and Perception: Attention normal.         Mood and Affect: Mood and affect normal.         Speech:  Speech normal. Behavior: Behavior is cooperative.         Thought Content: Thought content normal.               Neurologic Exam     Mental Status   Oriented to person, place, and time.   Attention: normal. Concentration: normal.   Speech: speech is normal   Level of consciousness: alert  Knowledge: good.     Cranial Nerves     CN II   Visual fields full to confrontation.     CN III, IV, VI   Pupils are equal, round, and reactive to light.  Extraocular motions are normal.   Right pupil: Consensual response: intact. Accommodation: intact.   Left pupil: Consensual response: intact. Accommodation: intact.   CN III: no CN III palsy  CN VI: no CN VI palsy  Nystagmus: none     CN V   Facial sensation intact.     CN VII   Facial expression full, symmetric.     CN VIII   CN VIII normal.     CN IX, X   CN IX normal.   CN X normal.     CN XI   CN XI normal.     CN XII   CN XII normal.     Motor Exam   Right arm tone: normal  Left arm tone: normal  Right leg tone: normal  Left leg tone: normal    Strength   Right deltoid: 5/5  Left deltoid: 5/5  Right biceps: 5/5  Left biceps: 5/5  Right triceps: 5/5  Left triceps: 5/5  Right wrist flexion: 5/5  Left wrist flexion: 5/5  Right wrist extension: 5/5  Left wrist extension: 5/5  Right interossei: 5/5  Left interossei: 5/5  Right iliopsoas: 5/5  Left iliopsoas: 5/5  Right quadriceps: 5/5  Left quadriceps: 5/5  Right hamstring: 5/5  Left hamstring: 5/5  Right anterior tibial: 5/5  Left anterior tibial: 5/5  Right gastroc: 5/5  Left gastroc: 5/5    Sensory Exam   Right arm light touch: normal  Left arm light touch: normal  Right leg light touch: normal  Left leg light touch: normal  Right arm vibration: normal  Left arm vibration: normal  Right leg vibration: normal  Left leg vibration: normal  Right arm pinprick: normal  Left arm pinprick: normal  Right leg pinprick: normal  Left leg pinprick: normal    Gait, Coordination, and Reflexes     Gait  Gait: (gait normal)    Coordination Romberg: negative  Tandem walking coordination: abnormal    Tremor   Resting tremor: absent    Reflexes   Right brachioradialis: 1+  Left brachioradialis: 1+  Right biceps: 1+  Left biceps: 1+  Right triceps: 1+  Left triceps: 1+  Right patellar: 1+  Left patellar: 1+             Diagnostic Studies and Review of Records:     MRI Brain with and without contrast 05/2023:  Impression  No acute intracranial abnormality. Mild cerebral atrophy.  Similar appearance of likely calcified meningioma along the planum sphenoidale.    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  Left: No evidence of temporal arteritis.

## 2023-08-26 NOTE — Unmapped (Signed)
Trigger Point Injection Procedure Note    Pre-operative Diagnosis: Cervical Myofascial Pain Syndrome and Tension-Type Headache, and non-invasive conservative therapy has not been successful as first line treatment.     Headache Frequency:  Injection # 1   Prior to Injections: 30 days/month, average pain: 8-10/10  Current (s/p last injection): N/A  Reduced number of headache: N/A      Procedure Details   Benefits, indications, expectations, as well as potential complications for trigger point injections were reviewed. Patient's questions were answered. Informed consent was obtained, and will later be scanned into electronic medical record.    Reviewed carefully with patient the potential adverse effects of trigger point injections, including but not limited to bleeding, infection, worsening of the pain, damage to the area being injected, weakness, allergic reaction to medication, vascular injection, and nerve damage. Reviewing potential benefit versus risk, she wished to move forward with injections at this time.      A timeout was performed immediately prior to the procedure, identifying patient, and location of injections.    The physical examination identifies a focal hypersensitive bundle or nodule of muscle fiber harder than normal consistency with or without a local twitch response and referred pain.    The areas of the trigger points were identified, the skin prepped with alcohol and the alcohol allowed to dry.  Next, a 30 gauge 0.5 inch needle was placed in the area of the trigger point.  Once local muscle twitch was elicited and negative aspiration confirmed, the trigger point was injected with starring technique and the needle removed.      The patient did tolerate the procedure well and there were not complications.      Medication used: 1 ml of 1% lidocaine W/O epinephrine    Trigger points injected: 10      Trigger point(s) location(s): Cervical Paravertebral musculature bilaterally, Trapezius bilaterally, Scapular musculature bilaterally, Subscapular musculature bilaterally, and Upper Thoracic Paravertebral musculature bilaterally      Lidocaine lot #: [id number]  Lidocaine expiration date: 08/2025        Plan:  Patient was counseled on expected effects and timing of them thereof, as well as after procedure care. Demonstrated and encouraged patient to perform cervical range of motion as often as possible throughout the day. Encouraged to call me if there is any severe adverse effect or excessive response to the treatment in order to determine if an emergent course of action would be necessary.    Otherwise, return to clinic for assessment of effectiveness and repeat trigger point injection x 2 sessions about 2 - 4 weeks apart

## 2023-08-27 NOTE — Unmapped (Signed)
Received fax from [employer] for prior auth on Invokana 300 mg tablets, needed Qty and ICD 10 code, SENT TO PLAN

## 2023-08-27 NOTE — Unmapped (Signed)
Schedulre contacted pt for PT SOC. No answer once again, Left VM(s). Pt has yet to return phone call.

## 2023-08-27 NOTE — Unmapped (Signed)
The [facility] has made a second and final attempt to reach this patient to refill the following medication:Cosentyx.      We have left voicemails on the following phone numbers: [phone], have sent a text message to the following phone numbers: [phone], and have sent a Mychart questionnaire..    Dates contacted: 08/20/2023  08/27/2023  Last scheduled delivery: 07/29/2023    The patient may be at risk of non-compliance with this medication. The patient should call the [facility] at [phone]  Option 4, then Option 2: Dermatology, Gastroenterology, Rheumatology to refill medication.    Ashley Briggs

## 2023-08-31 ENCOUNTER — Ambulatory Visit
Admit: 2023-08-31 | Discharge: 2023-09-01 | Payer: PRIVATE HEALTH INSURANCE | Attending: Hematology & Oncology | Primary: Hematology & Oncology

## 2023-08-31 DIAGNOSIS — Z23 Encounter for immunization: Principal | ICD-10-CM

## 2023-08-31 DIAGNOSIS — Z09 Encounter for follow-up examination after completed treatment for conditions other than malignant neoplasm: Principal | ICD-10-CM

## 2023-08-31 DIAGNOSIS — G8929 Other chronic pain: Principal | ICD-10-CM

## 2023-08-31 DIAGNOSIS — R110 Nausea: Principal | ICD-10-CM

## 2023-08-31 MED ORDER — OXYCODONE 5 MG TABLET
ORAL_TABLET | Freq: Four times a day (QID) | ORAL | 0 refills | 5 days | Status: CP | PRN
Start: 2023-08-31 — End: 2023-09-05

## 2023-08-31 MED ORDER — ONDANSETRON 4 MG DISINTEGRATING TABLET
ORAL_TABLET | Freq: Two times a day (BID) | 0 refills | 15 days | Status: CP | PRN
Start: 2023-08-31 — End: 2023-09-15

## 2023-08-31 NOTE — Unmapped (Signed)
Prior authorization started on cover my meds.
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
## 2023-08-31 NOTE — Unmapped (Addendum)
Invokana prior authorization initiated. Key: [id number]. Approved through 08/30/2024

## 2023-09-01 DIAGNOSIS — G8929 Other chronic pain: Principal | ICD-10-CM

## 2023-09-02 ENCOUNTER — Ambulatory Visit: Payer: Medicaid Other | Admitting: Internal Medicine

## 2023-09-02 ENCOUNTER — Other Ambulatory Visit: Payer: Self-pay | Admitting: Psychiatry

## 2023-09-02 ENCOUNTER — Encounter: Payer: Self-pay | Admitting: Internal Medicine

## 2023-09-02 VITALS — BP 104/60 | HR 100 | Temp 97.6°F | Ht 64.0 in | Wt 230.0 lb

## 2023-09-02 DIAGNOSIS — G4733 Obstructive sleep apnea (adult) (pediatric): Secondary | ICD-10-CM

## 2023-09-02 NOTE — Unmapped (Signed)
Oh I sent the referral yesterday, thank you though! :)

## 2023-09-02 NOTE — Unmapped (Signed)
Addended by: Barbaraann Boys on: 09/02/2023 08:33 AM     Modules accepted: Orders

## 2023-09-02 NOTE — Unmapped (Signed)
Referral pended if you approve

## 2023-09-02 NOTE — Patient Instructions (Signed)
Excellent job A+ Continue CPAP as prescribed Follow-up with [facility] for liver problems

## 2023-09-02 NOTE — Progress Notes (Signed)
Name: Abigail Molina MRN: [id number] DOB: [birth year]    CHIEF COMPLAINT:  Follow up assessment of OSA   HISTORY OF PRESENT ILLNESS: Patient with diagnosis of mild OSA Compliance report reviewed in detail with patient AHI was 7 now has significantly reduced to 0.3 with auto CPAP 4-10 fullface mask  No exacerbation at this time No evidence of heart failure at this time No evidence or signs of infection at this time No respiratory distress No fevers, chills, nausea, vomiting, diarrhea No evidence of lower extremity edema No evidence hemoptysis   Patient is non-smoker Nonalcoholic Patient has gained 30 pounds in the last 2 weeks Patient has a diagnosis of liver cirrhosis Plan is to get further assessment in [facility]  Increased abdominal distention due to liver cirrhosis and ascites Follow-up [facility] pending  PAST MEDICAL HISTORY :   has a past medical history of Anemia, Anxiety, Arthritis, Cancer (HCC), Chronic pain, Cirrhosis of liver (HCC), Depression, Diabetes mellitus without complication (HCC), Fatty liver disease, nonalcoholic (2021), GERD (gastroesophageal reflux disease), Glaucoma, Hyperlipidemia, Hypertension, Meningioma (HCC), Overactive bladder, and Psoriasis (2015).  has a past surgical history that includes Abdominal hysterectomy (2007) and Cholecystectomy (2000). Prior to Admission medications   Medication Sig Start Date End Date Taking? Authorizing Provider  aspirin 81 MG tablet Take 81 mg by mouth daily.    [provider]  buPROPion (WELLBUTRIN XL) 150 MG 24 hr tablet Take 1 tablet (150 mg total) by mouth daily. 01/20/23 02/19/23  Neysa Hotter, MD  canagliflozin (INVOKANA) 300 MG TABS tablet Take by mouth. 02/17/22   [provider]  carvedilol (COREG) 6.25 MG tablet Take 6.25 mg by mouth 2 (two) times daily. 06/19/22   [provider]  citalopram (CELEXA) 40 MG tablet Take 1 tablet (40 mg total) by mouth every morning. 10/30/22 01/28/23   Hisada, Barbee Cough, MD  COSENTYX SENSOREADY, 300 MG, 150 MG/ML SOAJ Inject into the skin.    [provider]  Dulaglutide (TRULICITY) 1.5 MG/0.5ML SOPN Inject 1.5 mg into the skin once a week. Thursdays    [provider]  Ferrous Sulfate (SLOW FE) 142 (45 Fe) MG TBCR Take by mouth. 09/12/21   [provider]  furosemide (LASIX) 20 MG tablet Take 20 mg by mouth daily.    [provider]  Insulin Pen Needle 32G X 6 MM MISC 1 Syringe by Does not apply route. Use with Victoza.    [provider]  lactulose (CHRONULAC) 10 GM/15ML solution SMARTSIG:Milliliter(s) By Mouth 06/19/22   [provider]  LANTUS SOLOSTAR 100 UNIT/ML Solostar Pen Inject 66 Units into the skin daily. 11/25/22 11/25/23  [provider]  metFORMIN (GLUCOPHAGE-XR) 500 MG 24 hr tablet Take 1,000 mg by mouth 2 (two) times daily.    [provider]  mirtazapine (REMERON) 7.5 MG tablet Take 1 tablet (7.5 mg total) by mouth at bedtime. 03/03/23 05/02/23  Neysa Hotter, MD  pantoprazole (PROTONIX) 40 MG tablet TAKE ONE TABLET BY MOUTH EVERY DAY 10/24/19   McGowan, Carollee Herter A, PA-C  promethazine (PHENERGAN) 12.5 MG tablet Take by mouth. 05/23/22   [provider]  simvastatin (ZOCOR) 20 MG tablet TAKE ONE TABLET BY MOUTH EVERY EVENING 10/24/19   McGowan, Carollee Herter A, PA-C  spironolactone (ALDACTONE) 100 MG tablet Take 100 mg by mouth 2 (two) times daily.    [provider]  triamcinolone ointment (KENALOG) 0.1 % Apply topically.    [provider]  XIFAXAN 550 MG TABS tablet Take 550 mg by  mouth 2 (two) times daily.    [provider]  zolpidem (AMBIEN) 5 MG tablet Take 1 tablet (5 mg total) by mouth at bedtime as needed. 04/05/23 05/05/23  Neysa Hotter, MD   No Known Allergies  FAMILY HISTORY:  family history includes Alcohol abuse in her cousin and maternal grandmother; Bipolar disorder in her cousin; Depression in her mother; Diabetes in her  mother; Heart disease in her maternal grandfather; Hypertension in her mother; Obesity in her mother; Stroke in her paternal grandfather; Vision loss in her mother. SOCIAL HISTORY:  reports that she has never smoked. She has never used smokeless tobacco. She reports that she does not currently use alcohol. She reports that she does not use drugs.    Review of Systems: Gen:  Denies  fever, sweats, chills weight loss  HEENT: Denies blurred vision, double vision, ear pain, eye pain, hearing loss, nose bleeds, sore throat Cardiac:  No dizziness, chest pain or heaviness, chest tightness,edema, No JVD Resp:   No cough, -sputum production, -shortness of breath,-wheezing, -hemoptysis,  Other:  All other systems negative   Physical Examination:   General Appearance: No distress  EYES PERRLA, EOM intact.   NECK Supple, No JVD Pulmonary: normal breath sounds, No wheezing.  CardiovascularNormal S1,S2.  No m/r/g.   Abdomen: Benign, Soft, non-tender. Neurology UE/LE 5/5 strength, no focal deficits Ext pulses intact, cap refill intact ALL OTHER ROS ARE NEGATIVE   ASSESSMENT AND PLAN SYNOPSIS  Patient with signs and symptoms of excessive daytime sleepiness with probable underlying diagnosis of obstructive sleep apnea in the setting of obesity and deconditioned state with underlying liver cirrhosis HST +for OSA AHI 7 Excellent compliance report at 100% AHI reduced to 0.3  Obesity -recommend significant weight loss -recommend changing diet  Deconditioned state -Recommend increased daily activity and exercise    Liver cirrhosis Patient to follow-up in [facility] for further assessment Follow-up in [facility]   MEDICATION ADJUSTMENTS/LABS AND TESTS ORDERED:    CURRENT MEDICATIONS REVIEWED AT LENGTH WITH PATIENT TODAY   Patient  satisfied with Plan of action and management. All questions answered  Follow up  6 months  Total Time Spent  25 mins   Wallis Bamberg Santiago Glad, M.D.   [facility]  Medical Director [facility] Medical Director [facility] Cardio-Pulmonary Department
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
## 2023-09-03 NOTE — Unmapped (Signed)
Hi Dr. Ruthe Mannan,  [facility] has multiple attempts to contact Ashley Briggs but we have been unable to contact her and have received no return calls.  If Ms. Pardi is available and requires our services please feel free to initiate another referral.  Thanks for all you do,  Marylou Flesher, MPT, Engineer, maintenance [facility]

## 2023-09-04 ENCOUNTER — Ambulatory Visit
Admit: 2023-09-04 | Discharge: 2023-09-05 | Payer: PRIVATE HEALTH INSURANCE | Attending: "Endocrinology | Primary: "Endocrinology

## 2023-09-04 DIAGNOSIS — R601 Generalized edema: Principal | ICD-10-CM

## 2023-09-04 DIAGNOSIS — E785 Hyperlipidemia, unspecified: Principal | ICD-10-CM

## 2023-09-04 DIAGNOSIS — Z794 Long term (current) use of insulin: Principal | ICD-10-CM

## 2023-09-04 DIAGNOSIS — I10 Essential (primary) hypertension: Principal | ICD-10-CM

## 2023-09-04 DIAGNOSIS — K7460 Unspecified cirrhosis of liver: Principal | ICD-10-CM

## 2023-09-04 DIAGNOSIS — I2510 Atherosclerotic heart disease of native coronary artery without angina pectoris: Principal | ICD-10-CM

## 2023-09-04 DIAGNOSIS — K7682 Hepatic encephalopathy (CMS-HCC): Principal | ICD-10-CM

## 2023-09-04 DIAGNOSIS — E113212 Type 2 diabetes mellitus with mild nonproliferative diabetic retinopathy with macular edema, left eye: Principal | ICD-10-CM

## 2023-09-04 DIAGNOSIS — I8510 Secondary esophageal varices without bleeding: Principal | ICD-10-CM

## 2023-09-04 DIAGNOSIS — Z8639 Personal history of other endocrine, nutritional and metabolic disease: Principal | ICD-10-CM

## 2023-09-04 DIAGNOSIS — R110 Nausea: Principal | ICD-10-CM

## 2023-09-04 LAB — ALBUMIN / CREATININE URINE RATIO
ALBUMIN QUANT URINE: <0.3 mg/dL
CREATININE, URINE: 22.4 mg/dL

## 2023-09-04 MED ORDER — ONDANSETRON 4 MG DISINTEGRATING TABLET
ORAL_TABLET | Freq: Two times a day (BID) | 0 refills | 15 days | PRN
Start: 2023-09-04 — End: 2023-09-19

## 2023-09-04 NOTE — Unmapped (Signed)
Please ask your PCP about a referral to a behavioral health therapist such as a LCSW for stress reduction.

## 2023-09-05 MED ORDER — ONDANSETRON 4 MG DISINTEGRATING TABLET
ORAL_TABLET | Freq: Two times a day (BID) | 0 refills | 15 days | PRN
Start: 2023-09-05 — End: 2023-09-20

## 2023-09-07 NOTE — Unmapped (Signed)
Diabetes is not at goal. Her blood sugars are ranging from the mid 200s into the 300s. POCT glucose is 218.     Patient is going through numerous stressors that is contributing to her high sugars.     Plan:  -Continue Lantus 55 units nightly, Trulicity 1.5 mg weekly, Humalog 10 units three times daily before meals and Invokana 300 mg daily.   -Reduce carbohydrate intake and aim to exercise once daily.   -Advised patient to discuss with her PCP about a therapy and possible psychiatry referral.   -Follow up in 2 months to review CGM data.     Lab Results   Component Value Date    A1C 10.3 (H) 08/16/2023    A1C 8.4 (H) 02/11/2023    A1C 7.8 (A) 11/04/2022    GLU 243 (H) 08/20/2023    GLU 390 (H) 08/19/2023    GLU 348 (H) 08/19/2023    CREATININE 0.70 08/20/2023    CREATININE 0.75 08/19/2023    CREATININE 0.74 08/19/2023    ALBCRERAT  09/04/2023      Comment:      Unable to calculate.    ALBCRERAT 7.7 02/11/2023    ALBCRERAT  02/11/2022      Comment:      Unable to calculate.      Wt Readings from Last 6 Encounters:   09/04/23 (!) 103.7 kg (228 lb 9.6 oz)   08/31/23 (!) 103.6 kg (228 lb 6.4 oz)   08/26/23 100.2 kg (221 lb)   08/18/23 91.6 kg (202 lb)   07/29/23 92.5 kg (204 lb)   06/08/23 91.9 kg (202 lb 8 oz)

## 2023-09-08 DIAGNOSIS — K7460 Unspecified cirrhosis of liver: Principal | ICD-10-CM

## 2023-09-08 DIAGNOSIS — K7581 Nonalcoholic steatohepatitis (NASH): Principal | ICD-10-CM

## 2023-09-08 NOTE — Unmapped (Signed)
Patient requested referral to [facility] for liver transplant evaluation. Referral order placed.

## 2023-09-11 NOTE — Unmapped (Signed)
Opened in error, contacted has been attempted by my team x2 today, no response and unable to leave VM.  Mychart message sent by team.

## 2023-09-15 ENCOUNTER — Ambulatory Visit: Admit: 2023-09-15 | Discharge: 2023-09-16 | Payer: PRIVATE HEALTH INSURANCE

## 2023-09-15 ENCOUNTER — Ambulatory Visit: Admit: 2023-09-15 | Discharge: 2023-09-16 | Payer: PRIVATE HEALTH INSURANCE | Attending: Family | Primary: Family

## 2023-09-15 DIAGNOSIS — M79621 Pain in right upper arm: Principal | ICD-10-CM

## 2023-09-15 NOTE — Unmapped (Signed)
Plan: Range of motion with the right arm as able.  If you feel the right hand get swollen or feel cold, move the arm to resolve the symptom.  Ice or heat as needed for pain.  Keep your MRI appointment and follow-up with Dr. Jacqlyn Krauss.      Thank you for coming to [facility] and our clinic today!     We aim to provide you with the highest quality, individualized care.  If you have any unanswered questions after the visit, please do not hesitate to reach out to [place] on MyChart or leave a message for the nurse.  ?  MyChart messages: These messages can be sent to your provider and will be checked by their clinical support staff.? The messages are checked throughout the day during normal business hours from 8:30 am-4:00 pm Monday-Friday, however responses may take up to 48 hours.? Please use this method of communication for non-urgent and non-emergent concerns, questions, refill requests or inquiries only.? ?Our team will help respond to all of your questions.? Please note that you may be asked to see a provider by either a telehealth or in person visit if it is deemed your questions are best handled in the clinic setting in person.??  ?  Please keep in mind, these messages are not real time communications, so be patient when waiting for a response.    If you do not have access to MyChart, do not know how to use MyChart or have an issue that may require more extensive discussion, please call the nurses' call line: [phone].? This line is checked throughout the day and will be responded to as time allows.? Please note that return calls could take up to 48 hours, depending on the nature of the need.?  ?  If you have an issue that requires emergent attention that cannot wait; either call the Orthopaedics resident on call at [phone], consider coming to our [facility] walk-in clinic, or go to the nearest Emergency Department.    If you need to schedule future appointments, please call [phone]. We look forward to seeing you again in the future and appreciate you choosing [place] for your care!    Thank you,                We provide innovative and comprehensive patient centered care that is supported by evidence-based research                                                                                                    RESEARCH PARTICIPATION    Please check out our current research studies to see if you or someone you know may qualify at:    [email or web address]

## 2023-09-15 NOTE — Unmapped (Signed)
SPORTS MEDICINE RETURN VISIT    ASSESSMENT AND PLAN      Diagnosis ICD-10-CM Associated Orders   1. Pain in right upper arm  M79.621 PVL Venous Duplex Upper Extremity Right           Exam is reassuring but will proceed with venous duplex Dopplers to ensure she has no DVT.  Given significantly improved external rotation and shoulder pain status post large-volume glenohumeral injection, no indication of current adhesive capsulitis.  Would recommend course of physical therapy which she has not had to date.    Return for Next scheduled follow up.    Procedure(s):  None      Addendum: Venous duplex Dopplers negative for DVT.    SUBJECTIVE     Chief Complaint: No chief complaint on file.      History of Present Illness: 57 y.o. female who presents for R shoulder pain.  She sustained a proximal humerus fracture when walking her dog on 03/18/2023.  She was initially seen and treated at outside hospital.  And then followed up in orthopedics.  Fracture was treated nonoperatively.  She subsequently developed a frozen shoulder and underwent a large-volume injection with Dr. Jacqlyn Krauss 06/05/2023, an MRI was ordered for concern for rotator cuff tear..  She called the clinic last week to report increase in pain, subjective swelling and occasional feeling of coldness in her right hand.  No interval injury.    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 2 diabetes mellitus (CMS-HCC) 12/02/2022    Dry eye syndrome, bilateral 12/21/2020    Dysphagia 02/02/2023    Endometrial cancer (CMS-HCC) 2007    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    Liver disease     Major depression     Morbid obesity (CMS-HCC) 11/10/2019    Myopia of both eyes with astigmatism and presbyopia 12/02/2022    Nausea 09/22/2022    Non-alcoholic fatty liver disease     NPDR (nonproliferative diabetic retinopathy) (CMS-HCC)     Right eye    Other ascites 09/22/2022    Other chronic pain 11/06/2022    Other insomnia 11/06/2022    PFD (pelvic floor dysfunction) 04/07/2013    Portal hypertension (CMS-HCC) 10/08/2022    Psoriasis  Ptosis of eyelid, left     Left upper eyelid    Reflux     Retinopathy of left eye, background, proliferative 01/09/2021    Right upper quadrant abdominal pain 09/22/2022    Secondary esophageal varices without bleeding (CMS-HCC) 06/17/2022    Type 2 diabetes mellitus, with long-term current use of insulin (CMS-HCC) 12/12/2015    Last Assessment & Plan:   The current medical regimen is effective;  continue present plan and medications.  Patient will continue good management of diabetes especially with weight loss. Hopefully with further weight loss will run into the problem of being overmedicated and having to cut back on medications     Some confusion on diabetes medications was have not.been refilled for over a year.  Pa         OBJECTIVE Physical Exam:  Vitals:   Wt Readings from Last 3 Encounters:   09/04/23 (!) 103.7 kg (228 lb 9.6 oz)   08/31/23 (!) 103.6 kg (228 lb 6.4 oz)   08/26/23 100.2 kg (221 lb)     Estimated body mass index is 39.24 kg/m?? as calculated from the following:    Height as of 08/31/23: 162.6 cm (5' 4).    Weight as of 09/04/23: 103.7 kg (228 lb 9.6 oz).  Gen: Well-appearing female in no acute distress  MSK: No tenderness to palpation about right shoulder and proximal humerus.  External rotation with arms at sides to 45 degrees.  Cuff strength is 4/5 throughout.  Positive Hawkins.  Negative Neer's.  No appreciable swelling right forearm or hand.  Radial pulse +2.  Capillary refills brisk.  Skin warm, dry and intact.    Imaging/other tests: No new imaging taken today.    @SPORTSPROMIS @      ADMINISTRATIVE     I have personally reviewed and interpreted the images (as available).  Point-of-care ultrasound imaging is on file and stored in a permanent location (if performed).  I have personally reviewed prior records and incorporated relevant information above (as available).    @SMIBILLING @    PROCEDURES     Procedures     DME     DME ORDER:  Dx:  ,

## 2023-09-16 ENCOUNTER — Other Ambulatory Visit: Payer: Self-pay | Admitting: Psychiatry

## 2023-09-16 ENCOUNTER — Ambulatory Visit: Admit: 2023-09-16 | Discharge: 2023-09-17 | Payer: PRIVATE HEALTH INSURANCE

## 2023-09-16 DIAGNOSIS — B070 Plantar wart: Principal | ICD-10-CM

## 2023-09-16 DIAGNOSIS — R110 Nausea: Principal | ICD-10-CM

## 2023-09-16 MED ORDER — ONDANSETRON 4 MG DISINTEGRATING TABLET
ORAL_TABLET | 0 refills | 0 days
Start: 2023-09-16 — End: ?

## 2023-09-17 MED ORDER — ONDANSETRON 4 MG DISINTEGRATING TABLET
ORAL_TABLET | 0 refills | 0 days
Start: 2023-09-17 — End: ?

## 2023-09-24 ENCOUNTER — Other Ambulatory Visit: Payer: Self-pay | Admitting: Psychiatry

## 2023-09-24 ENCOUNTER — Ambulatory Visit: Admit: 2023-09-24 | Discharge: 2023-09-25 | Payer: PRIVATE HEALTH INSURANCE

## 2023-09-24 NOTE — Unmapped (Signed)
Concerning Cosentyx, pa initiated via CMM; clinical questions answered and supporting documentation submitted and sent to plan today; awaiting outcome.     Key: [id number]

## 2023-09-25 DIAGNOSIS — G8929 Other chronic pain: Principal | ICD-10-CM

## 2023-09-30 ENCOUNTER — Other Ambulatory Visit: Payer: Self-pay | Admitting: Psychiatry

## 2023-09-30 ENCOUNTER — Ambulatory Visit: Admit: 2023-09-30 | Discharge: 2023-10-01 | Payer: PRIVATE HEALTH INSURANCE

## 2023-09-30 DIAGNOSIS — L409 Psoriasis, unspecified: Principal | ICD-10-CM

## 2023-09-30 MED ORDER — DEXCOM G7 SENSOR DEVICE
3 refills | 0 days | Status: CP
Start: 2023-09-30 — End: ?

## 2023-09-30 MED ORDER — COSENTYX PEN 300 MG/2 PENS (150 MG/ML) SUBCUTANEOUS
SUBCUTANEOUS | 5 refills | 28.00000 days | Status: CP
Start: 2023-09-30 — End: ?

## 2023-09-30 MED ORDER — COSENTYX PEN 150 MG/ML SUBCUTANEOUS
SUBCUTANEOUS | 0 refills | 0.00000 days | Status: CP
Start: 2023-09-30 — End: ?
  Filled 2023-10-07: qty 10, 35d supply, fill #0

## 2023-10-01 NOTE — Unmapped (Signed)
[facility] Specialty Medication Onboarding    Specialty Medication: Cosentyx  Prior Authorization: Approved - PA exp on 10/11/23  Financial Assistance: No - copay  <$25  Final Copay/Day Supply: $4 / 35 days (LD) & 28 days (MD)    Insurance Restrictions: None     Notes to Pharmacist: Patient has been out of medication for 2 months and needs to restart loading dose.  This prescription is for the loading dose, I just sent one for maintenance as well  Credit Card on File: yes    The triage team has completed the benefits investigation and has determined that the patient is able to fill this medication at [facility]. Please contact the patient to complete the onboarding or follow up with the prescribing physician as needed.

## 2023-10-02 NOTE — Unmapped (Signed)
SPORTS MEDICINE RETURN VISIT    ASSESSMENT AND PLAN      Diagnosis ICD-10-CM Associated Orders   1. Pain in right upper arm  M79.621       2. Closed fracture of proximal end of right humerus with routine healing, unspecified fracture morphology, subsequent encounter  S42.201D Lg Joint Inj: R subacromial bursa      3. Pain in joint of right shoulder  M25.511 Lg Joint Inj: R subacromial bursa           Assessment & Plan  Shoulder Pain  Severe pain and limited range of motion following a proximal humerus fracture. MRI shows healing fracture, high-grade supraspinatus tear, partial infraspinatus tear, and evidence of frozen shoulder. Previous intra-articular injection provided no relief. Discussed risks of surgery given patient's cirrhosis.  -Plan for subacromial injection targeting rotator cuff.  -Monitor blood sugars closely following injection due to potential impact on diabetes control.    Diabetes  On Trulicity and Lantus. Long discussion of impact of low-dose CSI on glc and A1c. She states that her sugars have been well controlled since d/c from hospital.   -Continue current treatment.  -Monitor blood sugars closely following shoulder injection.      No follow-ups on file.    Procedure(s):  Procedure Note:  Corticosteroid injection of the Right Subacromial Space:    Clinical Indication: shoulder pain, likely secondary to rotator cuff tendinosis     Informed Consent:   The risks and benefits of the procedure were discussed with the patient. Risks include pain (specifically discussing risk of steroid flare), bleeding, infection (1:10,000 minimized by sterile technique), liponecrosis (up to 0.6%, typically normalizes within 2-3 years), skin thinning, permanent skin hypopigmentation (up to 0.8% depending on location), and need for subsequent procedures. Rarely, damage to internal structures may occur requiring subsequent procedures or, exceedingly rare, loss of function of the joint; this risk is minimized through use of ultrasound guidance. Benefits may include symptom relief and potentially diagnosis.    The alternatives were explained to the patient including: not doing the procedure or postponing the procedure; dextrose-prolotherapy, and PRP injection. The disadvantages to not doing the procedure were discussed with the patent, including the persistence of symptoms.    Following this discussion, the patient voiced understanding and elected to proceed    Procedure Description:   Prior to procedure, timeout was completed verifying patient information x 2 and proper location and this was verified with patient.  The patient was placed in the beach chair position.  Sterile prep of the lateral shoulder was completed with chloroprep x2.  The probe was cavicided.  Ethyl chloride spray was used topically for local anesthesia. Clean technique with non-sterile gloves, sterile gel, and sterile needle with single touch utilized as a 25 G 1.5in needle was directed to the subacromial bursa under US guidance; needle was visualized in-plane, LAX to the target.  2cc lidocaine 1% without epinephrine and 0.5cc of 40mg /cc triamcinolone was injected into the subacromial bursa with good flow of mixture into affected area.  Patient tolerated well and without complication.  EBL: <42mL  Specimens Collected: None    Aftercare and Return Precautions discussed at length.  Follow-up as listed in Assessment and Plan.     Need for Sonographic Guidance  Given the complexity of this problem and the anatomic location of this structure, sonographic guidance is recommended to prevent injury to neurovascular structures and confirm accuracy of injection. The accuracy of doing these injections blind is poor and the benefit  to the patient by using ultrasound guidance is significant to avoid complications.     Reference:  Morey Hummingbird MM, Genene Churn, et al. [facility] Society for Sports Medicine (AMSSM) position statement: interventional musculoskeletal ultrasound in sports medicine. Br J Sports Med. 2015 Feb;49(3):145-50.             SUBJECTIVE     Chief Complaint:   Chief Complaint   Patient presents with    Right Shoulder - Pain       57 y.o. female     History of Present Illness  The patient, with a history of a broken shoulder, psoriasis, diabetes, and liver disease, presents with persistent shoulder pain following proximal humerus fracture, here for MRI review.. The pain is described as horrible and is present regardless of the patient's position. A previous LVI into the joint did not provide any relief. The patient is currently on Cosentyx for psoriasis, gabapentin, Trulicity and Lantus for diabetes, and Aldactone and rifaximin for liver disease. Lasix and Coreg are also being taken to manage fluid retention associated with psoriasis. The patient's shoulder pain is limiting her range of motion and causing significant discomfort.      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 2 diabetes mellitus (CMS-HCC) 12/02/2022    Dry eye syndrome, bilateral 12/21/2020    Dysphagia 02/02/2023    Endometrial cancer (CMS-HCC) 2007    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    Liver disease     Major depression     Morbid obesity (CMS-HCC) 11/10/2019    Myopia of both eyes with astigmatism and presbyopia 12/02/2022    Nausea 09/22/2022    Non-alcoholic fatty liver disease     NPDR (nonproliferative diabetic retinopathy) (CMS-HCC)     Right eye    Other ascites 09/22/2022    Other chronic pain 11/06/2022    Other insomnia 11/06/2022    PFD (pelvic floor dysfunction) 04/07/2013    Portal hypertension (CMS-HCC) 10/08/2022    Psoriasis     Ptosis of eyelid, left     Left upper eyelid    Reflux     Retinopathy of left eye, background, proliferative 01/09/2021    Right upper quadrant abdominal pain 09/22/2022  Secondary esophageal varices without bleeding (CMS-HCC) 06/17/2022    Type 2 diabetes mellitus, with long-term current use of insulin (CMS-HCC) 12/12/2015    Last Assessment & Plan:   The current medical regimen is effective;  continue present plan and medications.  Patient will continue good management of diabetes especially with weight loss. Hopefully with further weight loss will run into the problem of being overmedicated and having to cut back on medications     Some confusion on diabetes medications was have not.been refilled for over a year.  Pa         OBJECTIVE     Physical Exam:  Vitals:   Wt Readings from Last 3 Encounters:   09/30/23 (!) 103.8 kg (228 lb 14.4 oz)   09/04/23 (!) 103.7 kg (228 lb 9.6 oz)   08/31/23 (!) 103.6 kg (228 lb 6.4 oz)     Estimated body mass index is 39.29 kg/m?? as calculated from the following:    Height as of this encounter: 162.6 cm (5' 4).    Weight as of this encounter: 103.8 kg (228 lb 14.4 oz).  Gen: Well-appearing female in no acute distress  MSK:   RIGHT SHOULDER  Inspection/palpation: No swelling, erythema, deformity, atrophy or hypertrophy noted  Tenderness: diffusely  Range of Motion: Fwd Elevation: 90; ABD: 45; IR: mid-glute; ER: 20; ABD/IR: deferred; ABD/ER: deferred  Strength: abduction: 4/5  external rotation with shoulder at side: 4/5  flexion: 4/5       Results  RADIOLOGY  Shoulder MRI: Healing proximal humerus fracture, high-grade supraspinatus tear, partial infraspinatus tear, biceps tendonitis, evidence of adhesive capsulitis        ADMINISTRATIVE     I have personally reviewed and interpreted the images (as available).  Point-of-care ultrasound imaging is on file and stored in a permanent location (if performed).  I have personally reviewed prior records and incorporated relevant information above (as available).    Note written with [employer].      MEDICAL DECISION MAKING (level of service defined by 2/3 elements)     Number/Complexity of Problems Addressed 1 or more chronic illnesses with exacerbation, progression, or side effects of treatment (99204/99214)   Amount/Complexity of Data to be Reviewed/Analyzed 3 points: Review prior notes (1 point per unique source); Review test results (1 point per unique test); Order tests (1 point per unique test); Assessment requiring an independent historian (99204/99214)   Risk of Complications/Morbidity/Mortality of Management Decision for MINOR Surgery (Including Injection) WITH Risk Factors (99204/99214)     TIME     Total Time for E/M Services on the Date of Encounter Time-based coding not utilized for this encounter     CONSULTATION     Consultation services provided No     MODIFIER 25 (Significant, Separately Billable Evaluation and Management)     Documentation to ensure appropriate insurance payment for medically necessary work    Per the [employer] (rev. 12/01/2021) Chapter 13, Section B. Evaluation & Management (E&M) Services, paragraph 5:   ???In general, E&M services on the same date of service as the minor surgical procedure are included in the payment for the procedure???However, a significant and separately identifiable E&M service unrelated to the decision to perform the minor surgical procedure is separately reportable with modifier 25. The E&M service and minor surgical procedure do not require different diagnoses.???    Per the American Medical Association in Reporting CPT Modifier 25 [CPT??Geophysicist/field seismologist (Online). 2023;33(11):1-12.] Page  1, Appropriate Use, paragraph 1:   ???Modifier 25 is used to indicate that a patient's condition required a significant, separately identifiable evaluation and management (E/M) service above and beyond that associated with another procedure or service being reported by the same physician or other qualified health care professional [facility]) on the same date. This service must be above and beyond the other service provided or beyond the usual preoperative and postoperative care associated with the procedure or service that was performed on that same date, and it must be substantiated by documentation in the patient's record that satisfies the relevant criteria for the respective E/M service to be reported.???    Per the American Medical Association in Reporting CPT Modifier 25 [CPT??Geophysicist/field seismologist (Online). 2023;33(11):1-12.] Page 2, Considerations, bullet point 2 (Requires awareness of usual preoperative and postoperative services):   ???Pre- and post-operative services typically associated with a procedure include the following and cannot be reported with a separate E/M services code:   Review of patient's relevant past medical history,  Assessment of the problem area to be treated by surgical or other service,  Formulation and explanation of the clinical diagnosis,  Review and explanation of the procedure to the patient, family, or caregiver,  Discussion of alternative treatments or diagnostic options,  Obtaining informed consent,  Providing postoperative care instructions,  Discussion of any further treatment and follow up after the procedure???    As the service provider for this encounter, I attest that the patient's condition required a significant, separately identifiable, medical necessary evaluation and management service in addition to the procedure performed on the same date of service. The evaluation and management service was above and beyond the usual preoperative and postoperative care associated with the procedure. The specific elements of the encounter that represent evaluation and management service above and beyond the usual preoperative and postoperative care associated with the procedure include, but are not limited to:  Reviewed the patient's relevant past surgical history, social history and family history  Reviewed the patient's medications  Reviewed the patient's allergies  Independently obtained history of present illness and relevant review of systems  Independently reviewed laboratory, imaging and/or other data  Independently synthesized history, physical examination, laboratory, imaging and/or other data to formulate a management plan including elements separate from the procedure itself and usual postprocedural care       PROCEDURES     Lg Joint Inj: R subacromial bursa on 09/30/2023 3:40 PM  Indications: pain  Details: ultrasound-guided  Laterality: right  Location: shoulder  Medications: 20 mg triamcinolone acetonide 40 mg/mL    Medical Care Team Attestation: All ProcDoc orders were read back and verbally confirmed with the procedure provider, including but not limited to patient name, medication name, dose, and route, before any actions were taken.  Provider Attestation: The information documented by members of my medical care team was reviewed and verified for accuracy by me.           DME     DME ORDER:  Dx:  ,

## 2023-10-02 NOTE — Unmapped (Signed)
Ashley Briggs is flaring after being off therapy for a few months. We reviewed re-loading instructions.     [facility] Clinical Assessment & Refill Coordination Note    Ashley Briggs, DOB: [birth year]  Phone: [phone] (home)     All above HIPAA information was verified with patient.     Was a Nurse, learning disability used for this call? No    Specialty Medication(s):   Inflammatory Disorders: Cosentyx     Current Outpatient Medications   Medication Sig Dispense Refill    acetaminophen (TYLENOL) 325 MG tablet Take 2 tablets (650 mg total) by mouth two (2) times a day as needed for pain. 180 tablet 1    aspirin (ECOTRIN) 81 MG tablet Take 1 tablet (81 mg total) by mouth daily. (Patient not taking: Reported on 08/31/2023) 90 tablet 3    baclofen (LIORESAL) 5 mg Tab tablet Take 1 tablet (5 mg total) by mouth nightly. 30 tablet 11    blood sugar diagnostic Strp Test two (2) times a day (30 minutes before a meal). 100 strip 1    blood-glucose meter,continuous (DEXCOM G7 RECEIVER) Misc Use as instructed with G7 sensors. 1 each 1    blood-glucose sensor (DEXCOM G7 SENSOR) Devi Use to monitor blood glucose levels continuously. Change sensor every 10 days. 9 each 3    buPROPion (WELLBUTRIN XL) 150 MG 24 hr tablet Take 1 tablet (150 mg total) by mouth daily.      canagliflozin (INVOKANA) 300 mg Tab tablet Take 1 tablet (300 mg total) by mouth daily before breakfast. 90 tablet 0    carvedilol (COREG) 6.25 MG tablet Take 1 tablet (6.25 mg total) by mouth two (2) times a day. 60 tablet 11    chlorhexidine (PERIDEX) 0.12 % solution 15 mL by Mouth route daily.      cholestyramine-aspartame (CHOLESTYRAMINE LIGHT) 4 gram PwPk Mix 1 packet as directed and take by mouth daily. 60 packet 5    ciprofloxacin HCl (CIPRO) 500 MG tablet Take 1 tablet (500 mg total) by mouth two (2) times a day. (Patient not taking: Reported on 08/31/2023) 5 tablet 0    clotrimazole (MYCELEX) 10 mg troche Dissolve 1 tablet (10 mg total) in mouth five (5) times a day. (Patient not taking: Reported on 08/31/2023) 20 tablet 0    dulaglutide (TRULICITY) 1.5 mg/0.5 mL PnIj Inject 0.5 mL (1.5 mg total) under the skin every seven (7) days. 4 mL 6    empty container Misc Use as directed to dispose of Cosentyx pens. 1 each 2    ferrous sulfate (SLOW FE) 142 mg (45 mg iron) TbER Take 1 tablet (142 mg) by mouth two (2) times a day. 60 tablet 3    furosemide (LASIX) 40 MG tablet Take 1 tablet (40 mg total) by mouth two (2) times a day. 60 tablet 11    gabapentin (NEURONTIN) 300 MG capsule Take 1 capsule (300 mg total) by mouth Three (3) times a day. 90 capsule 0    gabapentin (NEURONTIN) 300 MG capsule Take by mouth.      insulin glargine (LANTUS SOLOSTAR U-100 INSULIN) 100 unit/mL (3 mL) injection pen Inject 0.5 mL (50 Units total) under the skin nightly. Inject as the units of insulin as directed by your doctor once daily around same time every day.  Total daily dose not to exceed 50 units. 15 mL 0    insulin lispro (HUMALOG) 100 unit/mL injection pen Inject 10 Units under the skin Three (3)  times a day before meals. 15 mL 2    lactulose 10 gram/15 mL solution Take 30 mL (20 g total) by mouth Three (3) times a day. 2700 mL 11    lancets (ACCU-CHEK SOFTCLIX LANCETS) Misc Use as directed to test once daily. 100 each 1    lidocaine (ASPERCREME) 4 % patch Place 1 patch on the skin daily. Apply for 12 hours then remove for 12 hours. 15 patch 0    magnesium oxide (MAG-OX) 400 mg (241.3 mg elemental magnesium) tablet Take 2 tablets (800 mg total) by mouth two (2) times a day. 120 tablet 11    metFORMIN (GLUCOPHAGE-XR) 500 MG 24 hr tablet Take 2 tablets (1,000 mg total) by mouth in the morning and 2 tablets (1,000 mg total) in the evening. Take with meals. 360 tablet 0    mirtazapine (REMERON) 7.5 MG tablet Take 1 tablet (7.5 mg total) by mouth.      ondansetron (ZOFRAN-ODT) 4 MG disintegrating tablet Dissolve 1 tablet (4 mg total) in the mouth every twelve (12) hours as needed for nausea. 30 tablet 0    pantoprazole (PROTONIX) 40 MG tablet Take 1 tablet (40 mg total) by mouth two (2) times a day. 180 tablet 1    pen needle, diabetic (ULTICARE PEN NEEDLE) 32 gauge x 1/4 (6 mm) Ndle Inject 1 each under the skin nightly. 100 each 1    promethazine (PHENERGAN) 12.5 MG tablet Take 1 tablet (12.5 mg total) by mouth every eight (8) hours as needed for nausea. 60 tablet 1    rifAXIMin (XIFAXAN) 550 mg Tab Take 1 tablet (550 mg total) by mouth two (2) times a day. 60 tablet 11    secukinumab (COSENTYX PEN) 150 mg/mL PnIj injection Inject the contents of 2 pens (300 mg total) under the skin once a week at weeks 0, 1, 2, 3, and 4. THEN inject the contents of 2 pens (300 mg total) every 4 weeks thereafter. 10 mL 0    secukinumab (COSENTYX PEN, 2 PENS,) 150 mg/mL PnIj injection Inject the contents of 2 pens (300 mg total) under the skin every twenty-eight (28) days. Maintenance dose 2 mL 5    simvastatin (ZOCOR) 20 MG tablet Take 1 tablet (20 mg total) by mouth every evening. 90 tablet 0    spironolactone (ALDACTONE) 100 MG tablet Take 1 tablet (100 mg total) by mouth two (2) times a day. 60 tablet 11    zolpidem (AMBIEN) 5 MG tablet Take 1 tablet (5 mg total) by mouth nightly as needed.       No current facility-administered medications for this visit.        Changes to medications: [name] reports no changes at this time.    No Known Allergies    Changes to allergies: No    SPECIALTY MEDICATION ADHERENCE     Cosentyx - 0 left  Medication Adherence    Patient reported X missed doses in the last month: 0  Specialty Medication: Cosentyx          Specialty medication(s) dose(s) confirmed:  reload with this fill      Are there any concerns with adherence? No    Adherence counseling provided? Not needed    CLINICAL MANAGEMENT AND INTERVENTION      Clinical Benefit Assessment:    Do you feel the medicine is effective or helping your condition? Yes    Clinical Benefit counseling provided? Not needed    Adverse Effects Assessment:  Are you experiencing any side effects? No    Are you experiencing difficulty administering your medicine? No    Quality of Life Assessment:    Quality of Life    Rheumatology  Oncology  Dermatology  1. What impact has your specialty medication had on the symptoms of your skin condition (i.e. itchiness, soreness, stinging)?: Some  2. What impact has your specialty medication had on your comfort level with your skin?: Some  Cystic Fibrosis          How many days over the past month did your psoriasis  keep you from your normal activities? For example, brushing your teeth or getting up in the morning. Patient declined to answer    Have you discussed this with your provider? Not needed    Acute Infection Status:    Acute infections noted within Epic:  No active infections  Patient reported infection: None    Therapy Appropriateness:    Is therapy appropriate based on current medication list, adverse reactions, adherence, clinical benefit and progress toward achieving therapeutic goals? Yes, therapy is appropriate and should be continued     DISEASE/MEDICATION-SPECIFIC INFORMATION      For patients on injectable medications: Patient currently has 0 doses left.  Next injection is scheduled for asap (overdue, will reload).    Chronic Inflammatory Diseases: Have you experienced any flares in the last month? Yes, has been flaring off medication  Has this been reported to your provider? Yes, patient had recent visit    PATIENT SPECIFIC NEEDS     Does the patient have any physical, cognitive, or cultural barriers? No    Is the patient high risk? No    Did the patient require a clinical intervention? No    Does the patient require physician intervention or other additional services (i.e., nutrition, smoking cessation, social work)? No    SOCIAL DETERMINANTS OF HEALTH     At the [facility], we have learned that life circumstances - like trouble affording food, housing, utilities, or transportation can affect the health of many of our patients.   That is why we wanted to ask: are you currently experiencing any life circumstances that are negatively impacting your health and/or quality of life? Patient declined to answer    Social Determinants of Health     Food Insecurity: No Food Insecurity (08/18/2023)    Hunger Vital Sign     Worried About Running Out of Food in the Last Year: Never true     Ran Out of Food in the Last Year: Never true   Internet Connectivity: No Internet connectivity concern identified (09/17/2022)    Internet Connectivity     Do you have access to internet services: Yes     How do you connect to the internet: Personal Device at home     Is your internet connection strong enough for you to watch video on your device without major problems?: Yes     Do you have enough data to get through the month?: Yes     Does at least one of the devices have a camera that you can use for video chat?: Yes   Housing/Utilities: Low Risk  (08/18/2023)    Housing/Utilities     Within the past 12 months, have you ever stayed: outside, in a car, in a tent, in an overnight shelter, or temporarily in someone else's home (i.e. couch-surfing)?: No     Are you worried about losing your housing?: No  Within the past 12 months, have you been unable to get utilities (heat, electricity) when it was really needed?: No   Tobacco Use: Medium Risk (09/30/2023)    Patient History     Smoking Tobacco Use: Never     Smokeless Tobacco Use: Never     Passive Exposure: Current   Transportation Needs: No Transportation Needs (08/18/2023)    PRAPARE - Transportation     Lack of Transportation (Medical): No     Lack of Transportation (Non-Medical): No   Alcohol Use: Not At Risk (01/09/2021)    Alcohol Use     How often do you have a drink containing alcohol?: Monthly or less     How many drinks containing alcohol do you have on a typical day when you are drinking?: 1 - 2     How often do you have 5 or more drinks on one occasion?: Never   Interpersonal Safety: Unknown (10/05/2023)    Interpersonal Safety     Unsafe Where You Currently Live: Not on file     Physically Hurt by Anyone: Not on file     Abused by Anyone: Not on file   Physical Activity: Insufficiently Active (01/09/2021)    Exercise Vital Sign     Days of Exercise per Week: 1 day     Minutes of Exercise per Session: 40 min   Intimate Partner Violence: Not At Risk (01/09/2021)    Humiliation, Afraid, Rape, and Kick questionnaire     Fear of Current or Ex-Partner: No     Emotionally Abused: No     Physically Abused: No     Sexually Abused: No   Stress: Stress Concern Present (09/17/2022)    [employer] of Occupational Health - Occupational Stress Questionnaire     Feeling of Stress : Very much   Substance Use: Not on file (10/05/2023)   Social Connections: Moderately Isolated (09/02/2018)    Received from [facility]    Social Connection and Isolation Panel [NHANES]     Frequency of Communication with Friends and Family: Once a week     Frequency of Social Gatherings with Friends and Family: Twice a week     Attends Religious Services: Never     Database administrator or Organizations: No     Attends Banker Meetings: Never     Marital Status: Widowed   Physicist, medical Strain: Low Risk  (08/18/2023)    Overall Financial Resource Strain (CARDIA)     Difficulty of Paying Living Expenses: Not very hard   Depression: Not at risk (07/12/2021)    PHQ-2     PHQ-2 Score: 0   Health Literacy: Low Risk  (01/09/2021)    Health Literacy     : Never       Would you be willing to receive help with any of the needs that you have identified today? Not applicable       SHIPPING     Specialty Medication(s) to be Shipped:   Inflammatory Disorders: Cosentyx    Other medication(s) to be shipped: No additional medications requested for fill at this time     Changes to insurance: No    Delivery Scheduled: Yes, Expected medication delivery date: 11/6.     Medication will be delivered via Same Day Courier to the confirmed prescription address in [facility].    The patient will receive a drug information handout for each medication shipped and additional FDA Medication Guides as  required.  Verified that patient has previously received a Conservation officer, historic buildings and a Surveyor, mining.    The patient or caregiver noted above participated in the development of this care plan and knows that they can request review of or adjustments to the care plan at any time.      All of the patient's questions and concerns have been addressed.    [facility] Specialty Pharmacist

## 2023-10-03 DIAGNOSIS — R110 Nausea: Principal | ICD-10-CM

## 2023-10-03 DIAGNOSIS — E1169 Type 2 diabetes mellitus with other specified complication: Principal | ICD-10-CM

## 2023-10-03 DIAGNOSIS — Z794 Long term (current) use of insulin: Principal | ICD-10-CM

## 2023-10-03 MED ORDER — METFORMIN ER 500 MG TABLET,EXTENDED RELEASE 24 HR
ORAL_TABLET | 0 refills | 0 days
Start: 2023-10-03 — End: ?

## 2023-10-03 MED ORDER — ONDANSETRON 4 MG DISINTEGRATING TABLET
ORAL_TABLET | 0 refills | 0 days
Start: 2023-10-03 — End: ?

## 2023-10-03 MED ORDER — SEMGLEE (INSULIN GLARGINE-YFGN) PEN 100 UNIT/ML (3 ML) SUBCUTANEOUS
0 refills | 0 days
Start: 2023-10-03 — End: ?

## 2023-10-05 MED ORDER — ONDANSETRON 4 MG DISINTEGRATING TABLET
ORAL_TABLET | Freq: Two times a day (BID) | 0 refills | 15 days | Status: CP | PRN
Start: 2023-10-05 — End: 2024-10-04

## 2023-10-05 MED ORDER — EMPTY CONTAINER
2 refills | 0 days
Start: 2023-10-05 — End: ?

## 2023-10-05 NOTE — Unmapped (Signed)
Patient is requesting the following refill  Requested Prescriptions     Pending Prescriptions Disp Refills    ondansetron (ZOFRAN-ODT) 4 MG disintegrating tablet [Pharmacy Med Name: Ondansetron 4 MG Oral Tablet Disintegrating] 30 tablet 0     Sig: Dissolve 1 tablet (4 mg total) in the mouth every twelve (12) hours as needed for nausea.       Recent Visits  Date Type Provider Dept   08/31/23 Office Visit Tessie Eke, MD [facility] [address] [facility]   05/08/23 Office Visit Mangel, Benison Pap, DO [facility] [address] [facility]   02/27/23 Office Visit Mangel, Benison Pap, DO [facility] [address] [facility]   01/01/23 Office Visit Mangel, Benison Pap, DO [facility] [address] [facility]   11/04/22 Office Visit Mangel, Benison Pap, DO [facility] [address] [facility]   10/16/22 Office Visit Mangel, Benison Pap, DO [facility] [address] At [facility]   Showing recent visits within past 365 days and meeting all other requirements  Future Appointments  Date Type Provider Dept   10/26/23 Appointment Mangel, Benison Pap, DO [facility] [address] At [facility]   Showing future appointments within next 365 days and meeting all other requirements       Labs: Not applicable this refill

## 2023-10-06 MED ORDER — METFORMIN ER 500 MG TABLET,EXTENDED RELEASE 24 HR
ORAL_TABLET | 0 refills | 0 days | Status: CP
Start: 2023-10-06 — End: ?

## 2023-10-06 MED ORDER — SEMGLEE (INSULIN GLARGINE-YFGN) PEN 100 UNIT/ML (3 ML) SUBCUTANEOUS
0 refills | 0 days | Status: CP
Start: 2023-10-06 — End: ?

## 2023-10-07 ENCOUNTER — Ambulatory Visit: Admit: 2023-10-07 | Discharge: 2023-10-08 | Payer: PRIVATE HEALTH INSURANCE

## 2023-10-07 DIAGNOSIS — G44221 Chronic tension-type headache, intractable: Principal | ICD-10-CM

## 2023-10-07 DIAGNOSIS — R2689 Other abnormalities of gait and mobility: Principal | ICD-10-CM

## 2023-10-07 MED ORDER — BACLOFEN 5 MG TABLET
ORAL_TABLET | 11 refills | 0 days | Status: CP
Start: 2023-10-07 — End: ?

## 2023-10-07 MED FILL — EMPTY CONTAINER: 120 days supply | Qty: 1 | Fill #0

## 2023-10-07 NOTE — Unmapped (Cosign Needed)
[facility] Neurology Clinic Summary       [facility] [address]  [facility] [address] [place]  [address]  [place] [address]  [phone]    Date: 10/07/2023  Patient Name: Ashley Briggs  MRN: [id number]  PCP: Karie Georges Pap  Mangel, Benison Pap, DO            Ashley Briggs is a 57 y.o.  female seen at the [facility] for follow up.     Assessment & Plan:   Assessment     Ashley Briggs is a 57 y.o.  female seen for the following problems:     Diagnosis ICD-10-CM Associated Orders   1. Imbalance  R26.89 Ambulatory referral to Physical Therapy      2. Chronic tension-type headache, intractable  G44.221 baclofen (LIORESAL) 5 mg Tab tablet          Headache: Patient with chronic headache, noting headache is daily.  This is at her whole head, and is intractable.  This is rated 8-10/10 and is described as pressure.  She notes that the pressure feeling will build up to a point where it feels like her head is going to explode.  There is no aura.  There is no light or sound sensitivity.  There is some smell sensitivity with strong food smells.  There is nausea with occasional vomiting.  There are no vision changes, tinnitus, autonomic signs.  Nothing is relieving or aggravating.  She reports 30/30 headache days per month with 20/30 that are debilitating.  She is currently taking gabapentin.  She has tried and failed Tylenol and trigger point injections.   She does note a superimposed headache that can come and go.  This is described as a very painful spot in 1 area of her head, which can vary.  This lasts for about an hour.    DDX:  Chronic tension type headache given pain is described as pressure and is without migrainous features.  Possible superimposed migraine given other headache can come and go and is more intense.    PLAN:  Increase baclofen to 10mg  nightly. Advised patient to contact me in 2 weeks so we can evaluate if further increase is needed.       Imbalance: Patient with reports of imbalance today, noting that she will list to 1 side while walking.  She denies any overt dizzy or spinning sensations.  She denies any sensory loss in her feet.  She has this checked regularly given her history of undercontrolled diabetes.  On exam, sensation to light touch, pinprick, vibration is intact.  Gait is normal and fluid with full strength throughout.  Romberg is negative.  Reflexes are 1+ throughout.  Tandem walk is abnormal.    DDX:  Possible BPPV but this is less likely given lack of spinning sensation.  Possible early large fiber neuropathy, but this is less likely given lack of sensory findings on exam.    PLAN:  Physical therapy for balance training. New referral placed and printed.       Follow Up:  1 months    Thank you very much for this consultation.    Sincerely,  Viviano Simas NP    Note forwarded to Dr. Lia Foyer for review.     CC: Mangel, Benison Pap, DO         I personally spent 21 minutes face-to-face and non-face-to-face in  the care of this patient, which includes all pre, intra, and post visit time on the date of service.  All documented time was specific to the E/M visit and does not include any procedures that may have been performed.     Portions of this record have been created using Scientist, clinical (histocompatibility and immunogenetics). Dictation errors have been sought, but may not have been identified and corrected.      Subjective:   Subjective        Obtained history from the patient      HPI: Patient is a 57 y.o. female seen at the request of Mangel, Benison Pap, DO. PMH notable for DM, HLD, HTN, cataract, retinopathy, CAD, hepatic steatosis, hepatic cirrhosis, secondary esophageal varices, OSA, headache, here today for follow up on headache.     Interval History:  Headaches are the same. The pain can ebb and flow. She has tried medication, meditation, no help.     Is using cane for imbalance now.     Prior History 08/2023:  Has not started baclofen yet. [facility] did not have this in stock.   Has appointment with PT, has not started just yet. Was seen in hospital.     Prior History 07/2023:  Still daily. Still having pressure build up, feeling like it's going to explode. Will have occasional very painful spot on head (area varies) that lasts about an hour. Headache is never gone.     Notes some imbalance as well, listing to one side. No dizziness or spinning. No sensory loss in feet per patient.     HA 1 - located whole head, intractable  Pain 8-10/10, described as pressure  Aura: No  Photophobia: No  Phonophobia: No  Osmophobia: Yes, describe: food smells  Nausea/Vomiting: Yes, describe: with occasional vomiting  Vision changes: No  Unilateral tearing, facial flushing, ptosis, weakness, paresthesia: No  Tinnitus: No  Relieving factors No  Aggravating factors: No     Current HA 1 days/month: 30  HA 1 days that are debilitating: 20    Current Medications:   gabapentin    Failed Medications:  Tylenol  Gabapentin  Trigger point injections      Past Medical Hx, Family Hx, Social Hx, and Problem List has been reviewed in the [employer].        REVIEW OF SYSTEMS: pertinent items noted in HPI          Objective:       Physical Exam:  Blood pressure 117/59, pulse 88, temperature 36.4 ??C (97.5 ??F), temperature source Temporal, height 162.6 cm (5' 4), weight 96.2 kg (212 lb), last menstrual period 08/03/2006, not currently breastfeeding.       Constitutional:       Appearance: Normal appearance. Well-developed and well-groomed.   Eyes:      Extraocular Movements: EOM normal.      Pupils: Pupils are equal, round, and reactive to light.   Neurological:      Mental Status: Oriented to person, place, and time.      Deep Tendon Reflexes:      Reflex Scores:       Tricep reflexes are 1+ on the right side and 1+ on the left side.       Bicep reflexes are 1+ on the right side and 1+ on the left side. Brachioradialis reflexes are 1+ on the right side and 1+ on the left side.       Patellar reflexes are 1+ on the right side and  1+ on the left side.  Psychiatric:         Attention and Perception: Attention normal.         Mood and Affect: Mood and affect normal.         Speech: Speech normal.         Behavior: Behavior is cooperative.         Thought Content: Thought content normal.         Neurologic Exam     Mental Status   Oriented to person, place, and time.   Attention: normal. Concentration: normal.   Speech: speech is normal   Level of consciousness: alert  Knowledge: good.     Cranial Nerves     CN II   Visual fields full to confrontation.     CN III, IV, VI   Pupils are equal, round, and reactive to light.  Extraocular motions are normal.   Right pupil: Consensual response: intact. Accommodation: intact.   Left pupil: Consensual response: intact. Accommodation: intact.   CN III: no CN III palsy  CN VI: no CN VI palsy  Nystagmus: none     CN V   Facial sensation intact.     CN VII   Facial expression full, symmetric.     CN VIII   CN VIII normal.     CN IX, X   CN IX normal.   CN X normal.     CN XI   CN XI normal.     CN XII   CN XII normal.     Motor Exam   Right arm tone: normal  Left arm tone: normal  Right leg tone: normal  Left leg tone: normal    Strength   Right deltoid: 5/5  Left deltoid: 5/5  Right biceps: 5/5  Left biceps: 5/5  Right triceps: 5/5  Left triceps: 5/5  Right wrist flexion: 5/5  Left wrist flexion: 5/5  Right wrist extension: 5/5  Left wrist extension: 5/5  Right interossei: 5/5  Left interossei: 5/5  Right iliopsoas: 5/5  Left iliopsoas: 5/5  Right quadriceps: 5/5  Left quadriceps: 5/5  Right hamstring: 5/5  Left hamstring: 5/5  Right anterior tibial: 5/5  Left anterior tibial: 5/5  Right gastroc: 5/5  Left gastroc: 5/5    Sensory Exam   Right arm light touch: normal  Left arm light touch: normal  Right leg light touch: normal  Left leg light touch: normal  Right arm vibration: normal  Left arm vibration: normal  Right leg vibration: normal  Left leg vibration: normal  Right arm pinprick: normal  Left arm pinprick: normal  Right leg pinprick: normal  Left leg pinprick: normal    Gait, Coordination, and Reflexes     Gait  Gait: (Cane assisted. Unable to raise from chair unassisted)    Tremor   Resting tremor: absent    Reflexes   Right brachioradialis: 1+  Left brachioradialis: 1+  Right biceps: 1+  Left biceps: 1+  Right triceps: 1+  Left triceps: 1+  Right patellar: 1+  Left patellar: 1+             Diagnostic Studies and Review of Records:     MRI Brain with and without contrast 05/2023:  Impression  No acute intracranial abnormality. Mild cerebral atrophy.  Similar appearance of likely calcified meningioma along the planum sphenoidale.    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  Left: No evidence of temporal arteritis.    LABS:  Component      Latest Ref Rng 08/20/2023   Sodium      135 - 145 mmol/L 130 (L)    Potassium      3.4 - 4.8 mmol/L 4.7    Chloride      98 - 107 mmol/L 100    CO2      20.0 - 31.0 mmol/L 21.0    Anion Gap      5 - 14 mmol/L 9    Bun      9 - 23 mg/dL 13    Creatinine      1.61 - 1.02 mg/dL 0.96    BUN/Creatinine Ratio 19    eGFR CKD-EPI (2021) Female      >=60 mL/min/1.32m2 >90    Glucose      70 - 179 mg/dL 045 (H)    Calcium      8.7 - 10.4 mg/dL 40.9    Albumin      3.4 - 5.0 g/dL 2.2 (L)    Total Protein      5.7 - 8.2 g/dL 7.3    Total Bilirubin      0.3 - 1.2 mg/dL 6.5 (H)    SGOT (AST)      <=34 U/L 51 (H)    ALT      10 - 49 U/L 26    Alkaline Phosphatase      46 - 116 U/L 364 (H)       Legend:  (L) Low  (H) High

## 2023-10-07 NOTE — Unmapped (Addendum)
It was a pleasure to see you today.     Increase baclofen to 10mg  nightly. Let me know in 2 weeks how this is going and we can increase further if needed.     Side effects include drowsiness and dizziness.     Take the paper order for physical therapy to a local PT.     Follow up: Return in about 4 weeks (around 11/04/2023).     If you have any questions in between visits, please send a message via MyChart or you can call our office. MyChart and the Clinic Triage are for non-urgent medical questions. If you are experiencing a medical emergency, please proceed to the closest Emergency Department.     Viviano Simas MSN, APRN, AGPCNP-BC  Nurse Practitioner, General Neurology  [facility] Department of Neurology  Phone: [phone]  Fax: [phone]

## 2023-10-09 ENCOUNTER — Other Ambulatory Visit: Payer: Self-pay | Admitting: Psychiatry

## 2023-10-09 DIAGNOSIS — B3731 Vaginal candidiasis: Principal | ICD-10-CM

## 2023-10-09 MED ORDER — FLUCONAZOLE 150 MG TABLET
ORAL_TABLET | Freq: Once | ORAL | 0 refills | 1 days | Status: CP
Start: 2023-10-09 — End: 2023-10-09

## 2023-10-12 ENCOUNTER — Ambulatory Visit
Admit: 2023-10-12 | Discharge: 2023-10-12 | Disposition: A | Discharge status: Home / Self Care | Payer: PRIVATE HEALTH INSURANCE

## 2023-10-12 DIAGNOSIS — R748 Abnormal levels of other serum enzymes: Principal | ICD-10-CM

## 2023-10-12 DIAGNOSIS — R739 Hyperglycemia, unspecified: Principal | ICD-10-CM

## 2023-10-12 LAB — CBC W/ AUTO DIFF
BASOPHILS ABSOLUTE COUNT: 0 10*9/L (ref 0.0–0.1)
BASOPHILS RELATIVE PERCENT: 0.3 %
EOSINOPHILS ABSOLUTE COUNT: 0.1 10*9/L (ref 0.0–0.5)
EOSINOPHILS RELATIVE PERCENT: 1.5 %
HEMATOCRIT: 30.2 % — ABNORMAL LOW (ref 34.0–44.0)
HEMOGLOBIN: 9.8 g/dL — ABNORMAL LOW (ref 11.3–14.9)
LYMPHOCYTES ABSOLUTE COUNT: 0.8 10*9/L — ABNORMAL LOW (ref 1.1–3.6)
LYMPHOCYTES RELATIVE PERCENT: 12.7 %
MEAN CORPUSCULAR HEMOGLOBIN CONC: 32.5 g/dL (ref 32.0–36.0)
MEAN CORPUSCULAR HEMOGLOBIN: 24.3 pg — ABNORMAL LOW (ref 25.9–32.4)
MEAN CORPUSCULAR VOLUME: 74.9 fL — ABNORMAL LOW (ref 77.6–95.7)
MEAN PLATELET VOLUME: 8.4 fL (ref 6.8–10.7)
MONOCYTES ABSOLUTE COUNT: 0.8 10*9/L (ref 0.3–0.8)
MONOCYTES RELATIVE PERCENT: 12 %
NEUTROPHILS ABSOLUTE COUNT: 4.8 10*9/L (ref 1.8–7.8)
NEUTROPHILS RELATIVE PERCENT: 73.5 %
NUCLEATED RED BLOOD CELLS: 0 /100{WBCs} (ref <=4)
PLATELET COUNT: 189 10*9/L (ref 150–450)
RED BLOOD CELL COUNT: 4.03 10*12/L (ref 3.95–5.13)
RED CELL DISTRIBUTION WIDTH: 18.3 % — ABNORMAL HIGH (ref 12.2–15.2)
WBC ADJUSTED: 6.5 10*9/L (ref 3.6–11.2)

## 2023-10-12 LAB — URINALYSIS WITH MICROSCOPY WITH CULTURE REFLEX PERFORMABLE
BACTERIA: NONE SEEN /HPF
BILIRUBIN UA: NEGATIVE
BLOOD UA: NEGATIVE
GLUCOSE UA: >1000 — AB
KETONES UA: NEGATIVE
LEUKOCYTE ESTERASE UA: NEGATIVE
NITRITE UA: NEGATIVE
PH UA: 6 (ref 5.0–9.0)
PROTEIN UA: NEGATIVE
RBC UA: 2 /HPF (ref <=4)
SPECIFIC GRAVITY UA: 1.023 (ref 1.003–1.030)
SQUAMOUS EPITHELIAL: 3 /HPF (ref 0–5)
UROBILINOGEN UA: 2 — AB
WBC UA: 2 /HPF (ref 0–5)

## 2023-10-12 LAB — COMPREHENSIVE METABOLIC PANEL
ALBUMIN: 2.2 g/dL — ABNORMAL LOW (ref 3.4–5.0)
ALKALINE PHOSPHATASE: 335 U/L — ABNORMAL HIGH (ref 46–116)
ALT (SGPT): 34 U/L (ref 10–49)
ANION GAP: 8 mmol/L (ref 5–14)
AST (SGOT): 58 U/L — ABNORMAL HIGH (ref <=34)
BILIRUBIN TOTAL: 4.6 mg/dL — ABNORMAL HIGH (ref 0.3–1.2)
BLOOD UREA NITROGEN: 20 mg/dL (ref 9–23)
BUN / CREAT RATIO: 26
CALCIUM: 8.9 mg/dL (ref 8.7–10.4)
CHLORIDE: 88 mmol/L — ABNORMAL LOW (ref 98–107)
CO2: 25.7 mmol/L (ref 20.0–31.0)
CREATININE: 0.78 mg/dL
EGFR CKD-EPI (2021) FEMALE: 88 mL/min/{1.73_m2} (ref >=60)
GLUCOSE RANDOM: 448 mg/dL — ABNORMAL HIGH (ref 70–179)
POTASSIUM: 4.5 mmol/L (ref 3.4–4.8)
PROTEIN TOTAL: 8.5 g/dL — ABNORMAL HIGH (ref 5.7–8.2)
SODIUM: 122 mmol/L — ABNORMAL LOW (ref 135–145)

## 2023-10-12 LAB — BLOOD GAS, VENOUS
BASE EXCESS VENOUS: 3.6 — ABNORMAL HIGH (ref -2.0–2.0)
HCO3 VENOUS: 26 mmol/L (ref 22–27)
O2 SATURATION VENOUS: 24.3 % — ABNORMAL LOW (ref 40.0–85.0)
PCO2 VENOUS: 42 mmHg (ref 40–60)
PH VENOUS: 7.43 (ref 7.32–7.43)
PO2 VENOUS: 19 mmHg — ABNORMAL LOW (ref 35–40)

## 2023-10-12 LAB — PHOSPHORUS: PHOSPHORUS: 2.8 mg/dL (ref 2.4–5.1)

## 2023-10-12 LAB — PROTIME-INR
INR: 1.23
PROTIME: 14 s — ABNORMAL HIGH (ref 9.9–12.6)

## 2023-10-12 LAB — LIPASE: LIPASE: 214 U/L — ABNORMAL HIGH (ref 12–53)

## 2023-10-12 LAB — AMMONIA: AMMONIA: 11 umol/L (ref 11–32)

## 2023-10-12 LAB — APTT
APTT: 30.5 s (ref 24.8–38.4)
HEPARIN CORRELATION: 0.2

## 2023-10-12 LAB — PRO-BNP: PRO-BNP: 655 pg/mL — ABNORMAL HIGH (ref <=300.0)

## 2023-10-12 LAB — DARK GREEN EXTRA TUBE

## 2023-10-12 LAB — MAGNESIUM: MAGNESIUM: 1.7 mg/dL (ref 1.6–2.6)

## 2023-10-12 LAB — GREEN LITHIUM HEPARIN EXTRA TUBE

## 2023-10-12 LAB — HIGH SENSITIVITY TROPONIN I - SINGLE: HIGH SENSITIVITY TROPONIN I: 8 ng/L (ref <=34)

## 2023-10-12 MED ADMIN — famotidine (PEPCID) injection 20 mg: 20 mg | INTRAVENOUS | @ 14:00:00 | Stop: 2023-10-12

## 2023-10-12 MED ADMIN — ondansetron (ZOFRAN) injection 4 mg: 4 mg | INTRAVENOUS | @ 14:00:00 | Stop: 2023-10-12

## 2023-10-12 MED ADMIN — insulin regular (HumuLIN,NovoLIN) injection 5 Units: 5 [IU] | SUBCUTANEOUS | @ 12:00:00 | Stop: 2023-10-12

## 2023-10-12 MED ADMIN — morphine 4 mg/mL injection 4 mg: 4 mg | INTRAVENOUS | @ 11:00:00 | Stop: 2023-10-12

## 2023-10-12 MED ADMIN — ketorolac (TORADOL) injection 15 mg: 15 mg | INTRAVENOUS | @ 12:00:00 | Stop: 2023-10-12

## 2023-10-12 MED ADMIN — insulin regular (HumuLIN,NovoLIN) injection 9.4 Units: .1 [IU]/kg | INTRAVENOUS | @ 15:00:00 | Stop: 2023-10-12

## 2023-10-12 MED ADMIN — morphine injection 2 mg: 2 mg | INTRAVENOUS | @ 14:00:00 | Stop: 2023-10-12

## 2023-10-12 NOTE — Unmapped (Shared)
[facility]  Emergency Department Progress Note    October 12, 2023 7:14 AM         ED care from Dr Adora Fridge at 7:00 AM. Ashley Briggs is a 57 y.o. female with past medical history of hypertension, diabetes, NASH, CAD, cirrhosis who presents to the emergency department with epigastric abdominal pain and jaw pain.     At signout, pending improvement of glucose level after insulin as well as pain control. Anticipate close follow up with PCP.      Medical Decision Making and Progress Notes        Discussion of Management with other Physicians, QHP or Appropriate Source: ***  Independent Interpretation of Studies:   External Records Reviewed: Patient's most recent outpatient clinic note  Escalation of Care, Consideration of Admission/Observation/Transfer: ***  Social determinants that significantly affected care: None applicable  Prescription drug(s) considered but not prescribed: ***  Diagnostic tests considered but not performed: ***  History obtained from other sources: None    Additional Progress Notes and Critical Care    0900  Patient feeling much better after morphine. Is requesting more. States she has pain all over and chronic abdominal pain. Is being referred to pain clinic by PCP according to patient.    Abd pain: Here abd is soft and NT. She states pain is somewhat chronic. Lipase elevated to 200s but no evidence of severe pancreatitis based on sxs and exam. Could be pancreatitis related to trulicity, GERD/gastritis, cirrhosis. S/P cholecystectomy. No evidence of SBP (no fever, AMS, leukocytosis, abdominal tenderness)  -Morphine, zofran, pepcid  -Reassess and PO challenge, plan to discharge if feels better.  Hyperglycemia: Glucose in 400s with no evidence DKA/HHS. Given 5 units of insulin and remains high.  -Insulin and recheck.  Left jaw pain: Ongoing for months, worsening. Patient has definite tooth decay at #17 with some jaw tenderness on exam. No redness/drainage/trismus/swelling to suspect deep space infection. No evidence of cardiac ischemia to suggest angina equivalent.  -Will need abx and dental referral.    Portions of this record have been created using Scientist, clinical (histocompatibility and immunogenetics). Dictation errors have been sought, but may not have been identified and corrected.      ED Clinical Impression     Final diagnoses:   Elevated lipase (Primary)     Documentation assistance was provided by Kela Millin, Scribe, on October 12, 2023 at 7:15 AM for Kevin Fenton, MD. October 12, 2023 9:19 AM. Documentation assistance provided by the above mentioned scribe. I was present during the time the encounter was recorded. The information recorded by the scribe was done at my direction and has been reviewed and validated by me.    Note has been documented by Tollie Eth. Bell on 10/12/2023

## 2023-10-12 NOTE — Unmapped (Signed)
 [facility] Emergency Department Provider Note    ED Clinical Impression     Final diagnoses:   Elevated lipase (Primary)       History     CHIEF COMPLAINT:   Chief Complaint   Patient presents with    Medical Problem     HPI: Ashley Briggs is a pleasant 57 y.o. female with a history of hypertension, diabetes, NASH, CAD, cirrhosis who presents to the emergency department complaining of abdominal pain.  Patient states that the abdominal pain started 4 days ago and is located in the epigastric region.  Patient states that the pain is worse after she eats.  Patient reports nausea but denies any vomiting.  Patient had 1 episode of diarrhea yesterday which was nonbloody in nature.  Patient also states that she has felt intermittent confusion described as not knowing where she is for the last week.  Patient also reports a month of bilateral jaw pain for which she has been referred to a periodontist which has been worse for the last 4 days.  Patient endorses chronic headache but states that her headaches are not worse than normal; she is followed by neurology for this issue.  Patient denies any fever, chills, weakness, numbness, dysuria, chest pain, shortness of breath, back pain, lower extremity edema, ear pain, neck pain, or any other symptoms.    History provided/supplemented by patient.    Physical Exam   BP 118/59  - Pulse 87  - Temp 37 ??C (98.6 ??F) (Oral)  - Resp 16  - Ht 162.6 cm (5' 4)  - Wt 93.9 kg (207 lb)  - LMP 08/03/2006  - SpO2 100%  - BMI 35.53 kg/m??     Constitutional: Alert and oriented. No acute distress. Well-appearing.  Eyes: Conjunctivae are normal.  HEENT: Normocephalic and atraumatic. Conjunctivae clear. No congestion. Moist mucous membranes. Tolerating secretions well. No jaw induration. Pharynx without exudates, uvula is midline.   Cardiovascular: Rate as above, regular rhythm. Normal and symmetric distal pulses. Brisk capillary refill. Normal skin turgor.  Respiratory: Normal respiratory effort. Breath sounds are normal. There are no wheezing or crackles heard.  Gastrointestinal: Mild epigastric tenderness. Soft, non-distended.  Genitourinary: Deferred.  Musculoskeletal: Non-tender with normal range of motion in all extremities. No lower extremity edema.  Neurologic: Normal speech and language. No gross focal neurologic deficits are appreciated. Patient is moving all extremities equally, face is symmetric at rest and with speech.  Skin: Skin is warm, dry and intact. No rash noted.  Psychiatric: Mood and affect are normal. Speech and behavior are normal.    Assessment & Plan, ED Course     This is a 57 year old female with past medical history of hypertension, diabetes, NASH, CAD, cirrhosis who presents to the emergency department with epigastric abdominal pain and jaw pain.    On examination, patient has mild tenderness to the epigastric region without guarding, no other abdominal tenderness.  Abdomen is soft, obese.  Patient is alert and oriented x 4, nonfocal neurologic exam.  No jaw induration, posterior pharynx is clear and nonerythematous.    For the abdominal pain, given the broad differential of abdominal pain, will obtain basic labs.  Lipase to evaluate for pancreatitis.  CBC to evaluate for leukocytosis or anemia.  Chemistry to evaluate for electrolyte abnormalities, acidosis or alkalosis, and liver function test to evaluate for hepato-biliary disease.  Urinalysis to evaluate for cystitis, pyelonephritis, hematuria suggest renal colic.  I considered SBP in the setting of cirrhosis; however,  patient has had no increased abdominal swelling or fevers.  I considered CT scan; however, the abdomen is soft, low concern for SBO or other acute abdominal surgical emergency.  For the jaw pain, this is a chronic complaint with mild worsening over the last 4 days. Patient tolerating secretions well. Further diagnostic workup as below.     Orders Placed This Encounter   Procedures    ED Extra Tubes    CBC w/ Differential    Comprehensive Metabolic Panel    Magnesium    Phosphorus    PT-INR    PTT    hsTroponin I (single, no delta)    Pro-BNP    Lipase    Urinalysis with Microscopy with Culture Reflex    Blood Gas, Venous    Ammonia    ECG 12 Lead       ED Course:  ED Course as of 10/12/23 0702   Mon Oct 12, 2023   0524 CBC shows stable hemoglobin at 9.8 consistent with prior, no thrombocytopenia improved from prior, no leukocytosis   0525 PT(!): 14.0  Consistent with prior   0525 APTT: 30.5   0526 ECG 12 Lead  ECG shows sinus rhythm with PACs at a rate of 87 bpm, normal intervals, no acute ischemic changes   0526 VBG shows no hypercarbia, pH 7.43   0535 Lipase(!): 214  Concern for pancreatitis   0535 hsTroponin I: 8   0535 Magnesium: 1.7   0536 Sodium(!): 122  Corrects to 130 given hyperglycemia   0537 Ketones, UA: Negative   0538 Previous history of gallbladder pancreatitis, s/p cholecystectomy   0538 UA shows no UTI, no hematuria   0540 Ammonia: 11   0701 At time of signout, pending improvement of glucose after insulin administration.  If pain is well-controlled with morphine and Toradol, anticipate that patient will be able to be discharged with close follow-up with primary care provider.       Discussion of Management with other Physicians, QHP or Appropriate Source: None  Independent Interpretation of Studies: EKG(s) - as above  I have reviewed recent and relevant previous record, including: Outpatient notes - patient has been evaluated by physicians multiple times over the last month for pain including headache and arm pain  Diagnostic tests considered but not performed: CT as above, SBP workup as above  _____________________________________________________________________    The case was discussed with attending physician who is in agreement with the above assessment and plan    Additional History     PAST MEDICAL HISTORY/PAST SURGICAL HISTORY:   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 2 diabetes mellitus (CMS-HCC) 12/02/2022    Dry eye syndrome, bilateral 12/21/2020    Dysphagia 02/02/2023    Endometrial cancer (CMS-HCC) 2007    Esophageal varices in cirrhosis (CMS-HCC) 06/17/2022    Gastroparesis     Generalized pruritus 02/02/2023    H/O diabetic gastroparesis 01/09/2021  Heart disease     Hemorrhage of rectum and anus 03/17/2011    Hepatic cirrhosis (CMS-HCC) 06/17/2022    Hepatic encephalopathy (CMS-HCC) 06/17/2022    Hepatic steatosis 03/13/2021    Noted on CT Fall 2021    High cholesterol     Hx of psoriasis 08/06/2022    Hyperlipidemia 12/12/2015    Last Assessment & Plan:   The current medical regimen is effective;  continue present plan and medications.    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    Liver disease     Major depression     Morbid obesity (CMS-HCC) 11/10/2019    Myopia of both eyes with astigmatism and presbyopia 12/02/2022    Nausea 09/22/2022    Non-alcoholic fatty liver disease     NPDR (nonproliferative diabetic retinopathy) (CMS-HCC)     Right eye    Other ascites 09/22/2022    Other chronic pain 11/06/2022    Other insomnia 11/06/2022    PFD (pelvic floor dysfunction) 04/07/2013    Portal hypertension (CMS-HCC) 10/08/2022    Psoriasis     Ptosis of eyelid, left     Left upper eyelid    Reflux     Retinopathy of left eye, background, proliferative 01/09/2021    Right upper quadrant abdominal pain 09/22/2022    Secondary esophageal varices without bleeding (CMS-HCC) 06/17/2022    Type 2 diabetes mellitus, with long-term current use of insulin (CMS-HCC) 12/12/2015    Last Assessment & Plan:   The current medical regimen is effective;  continue present plan and medications.  Patient will continue good management of diabetes especially with weight loss. Hopefully with further weight loss will run into the problem of being overmedicated and having to cut back on medications     Some confusion on diabetes medications was have not.been refilled for over a year.  Pa       Past Surgical History:   Procedure Laterality Date    CHOLECYSTECTOMY      HYSTERECTOMY      endometrial cancer    HYSTERECTOMY      OOPHORECTOMY      PR ANAL PRESSURE RECORD Left 03/31/2013    Procedure: ANORECTAL MANOMETRY;  Surgeon: None None;  [facility];  Service: Gastroenterology    PR BREATH HYDROGEN TEST N/A 03/31/2013    Procedure: BREATH HYDROGEN TEST;  Surgeon: None None;  Location: [facility];  Service: Gastroenterology    PR COLSC FLX W/RMVL OF TUMOR POLYP LESION SNARE TQ N/A 09/06/2020    Procedure: COLONOSCOPY FLEX; W/REMOV TUMOR/LES BY SNARE;  Surgeon: Chriss Driver, MD;  Location: [facility];  Service: Gastroenterology    PR UPPER GI ENDOSCOPY,BIOPSY N/A 04/15/2013    Procedure: UGI ENDOSCOPY; WITH BIOPSY, SINGLE OR MULTIPLE;  Surgeon: Vickii Chafe, MD;  Location: [facility];  Service: Gastroenterology    PR UPPER GI ENDOSCOPY,BIOPSY N/A 09/06/2020    Procedure: UGI ENDOSCOPY; WITH BIOPSY, SINGLE OR MULTIPLE;  Surgeon: Chriss Driver, MD;  [facility]: [facility];  Service: Gastroenterology    PR UPPER GI ENDOSCOPY,BIOPSY N/A 05/05/2022    Procedure: UGI ENDOSCOPY; WITH BIOPSY, SINGLE OR MULTIPLE;  Surgeon: Chriss Driver, MD;  [facility]: [facility];  Service: Gastroenterology    PR UPPER GI ENDOSCOPY,BIOPSY N/A 01/22/2023    Procedure: UGI ENDOSCOPY; WITH BIOPSY, SINGLE OR MULTIPLE;  Surgeon: Vonda Antigua, MD;  [facility]  [facility];  Service: Gastroenterology       MEDICATIONS:   No current facility-administered medications for this encounter.    Current Outpatient Medications:     acetaminophen (TYLENOL) 325 MG tablet, Take 2 tablets (650 mg total) by mouth two (2) times a day as needed for pain., Disp: 180 tablet, Rfl: 1    aspirin (ECOTRIN) 81 MG tablet, Take 1 tablet (81 mg total) by mouth daily., Disp: 90 tablet, Rfl: 3    baclofen (LIORESAL) 5 mg Tab tablet, Increase to 10mg  nightly., Disp: 60 tablet, Rfl: 11    blood sugar diagnostic Strp, Test two (2) times a day (30 minutes before a meal)., Disp: 100 strip, Rfl: 1    blood-glucose meter,continuous (DEXCOM G7 RECEIVER) Misc, Use as instructed with G7 sensors., Disp: 1 each, Rfl: 1    blood-glucose sensor (DEXCOM G7 SENSOR) Devi, Use to monitor blood glucose levels continuously. Change sensor every 10 days., Disp: 9 each, Rfl: 3    buPROPion (WELLBUTRIN XL) 150 MG 24 hr tablet, Take 1 tablet (150 mg total) by mouth daily., Disp: , Rfl:     canagliflozin (INVOKANA) 300 mg Tab tablet, Take 1 tablet (300 mg total) by mouth daily before breakfast., Disp: 90 tablet, Rfl: 0    carvedilol (COREG) 6.25 MG tablet, Take 1 tablet (6.25 mg total) by mouth two (2) times a day., Disp: 60 tablet, Rfl: 11    chlorhexidine (PERIDEX) 0.12 % solution, 15 mL by Mouth route daily., Disp: , Rfl:     cholestyramine-aspartame (CHOLESTYRAMINE LIGHT) 4 gram PwPk, Mix 1 packet as directed and take by mouth daily., Disp: 60 packet, Rfl: 5    clotrimazole (MYCELEX) 10 mg troche, Dissolve 1 tablet (10 mg total) in mouth five (5) times a day., Disp: 20 tablet, Rfl: 0    dulaglutide (TRULICITY) 1.5 mg/0.5 mL PnIj, Inject 0.5 mL (1.5 mg total) under the skin every seven (7) days., Disp: 4 mL, Rfl: 6    empty container Misc, Use as directed to dispose of Cosentyx pens., Disp: 1 each, Rfl: 2    ferrous sulfate (SLOW FE) 142 mg (45 mg iron) TbER, Take 1 tablet (142 mg) by mouth two (2) times a day., Disp: 60 tablet, Rfl: 3    furosemide (LASIX) 40 MG tablet, Take 1 tablet (40 mg total) by mouth two (2) times a day., Disp: 60 tablet, Rfl: 11    gabapentin (NEURONTIN) 300 MG capsule, Take 1 capsule (300 mg total) by mouth Three (3) times a day., Disp: 90 capsule, Rfl: 0    gabapentin (NEURONTIN) 300 MG capsule, Take by mouth., Disp: , Rfl:     insulin lispro (HUMALOG) 100 unit/mL injection pen, Inject 10 Units under the skin Three (3) times a day before meals., Disp: 15 mL, Rfl: 2    lactulose 10 gram/15 mL solution, Take 30 mL (20 g total) by mouth Three (3) times a day., Disp: 2700 mL, Rfl: 11    lancets (ACCU-CHEK SOFTCLIX LANCETS) Misc, Use as directed to test once daily., Disp: 100 each, Rfl: 1    lidocaine (ASPERCREME) 4 % patch, Place 1 patch on the skin daily. Apply for 12 hours then remove for 12 hours., Disp: 15 patch, Rfl: 0    magnesium oxide (MAG-OX) 400 mg (241.3 mg elemental magnesium) tablet, Take 2 tablets (800 mg total) by mouth two (2) times a day., Disp: 120 tablet, Rfl: 11    metFORMIN (GLUCOPHAGE-XR) 500 MG 24 hr tablet, TAKE 2 TABLETS BY  MOUTH IN THE MORNING AND 2 IN THE EVENING WITH MEALS, Disp: 360 tablet, Rfl: 0    mirtazapine (REMERON) 7.5 MG tablet, Take 1 tablet (7.5 mg total) by mouth., Disp: , Rfl:     ondansetron (ZOFRAN-ODT) 4 MG disintegrating tablet, Dissolve 1 tablet (4 mg total) in the mouth every twelve (12) hours as needed for nausea., Disp: 30 tablet, Rfl: 0    pantoprazole (PROTONIX) 40 MG tablet, Take 1 tablet (40 mg total) by mouth two (2) times a day., Disp: 180 tablet, Rfl: 1    pen needle, diabetic (ULTICARE PEN NEEDLE) 32 gauge x 1/4 (6 mm) Ndle, Inject 1 each under the skin nightly., Disp: 100 each, Rfl: 1    promethazine (PHENERGAN) 12.5 MG tablet, Take 1 tablet (12.5 mg total) by mouth every eight (8) hours as needed for nausea., Disp: 60 tablet, Rfl: 1    rifAXIMin (XIFAXAN) 550 mg Tab, Take 1 tablet (550 mg total) by mouth two (2) times a day., Disp: 60 tablet, Rfl: 11    secukinumab (COSENTYX PEN) 150 mg/mL PnIj injection, Inject the contents of 2 pens (300 mg total) under the skin once a week at weeks 0, 1, 2, 3, and 4. THEN inject the contents of 2 pens (300 mg total) every 4 weeks thereafter., Disp: 10 mL, Rfl: 0    secukinumab (COSENTYX PEN, 2 PENS,) 150 mg/mL PnIj injection, Inject the contents of 2 pens (300 mg total) under the skin every twenty-eight (28) days. Maintenance dose, Disp: 2 mL, Rfl: 5    SEMGLEE,INSULIN GLARG-YFGN,PEN 100 unit/mL (3 mL) InPn, INJECT 50 UNITS UNDER THE SKIN NIGHTLY AS DIRECTED BY YOUR DOCTOR AROUND SAME TIME EVERY DAY. TOTAL DAILY DOSE NOT TO EXCEED 50 UNITS, Disp: 15 mL, Rfl: 0    simvastatin (ZOCOR) 20 MG tablet, Take 1 tablet (20 mg total) by mouth every evening., Disp: 90 tablet, Rfl: 0    spironolactone (ALDACTONE) 100 MG tablet, Take 1 tablet (100 mg total) by mouth two (2) times a day., Disp: 60 tablet, Rfl: 11    zolpidem (AMBIEN) 5 MG tablet, Take 1 tablet (5 mg total) by mouth nightly as needed., Disp: , Rfl:     ALLERGIES:   Patient has no known allergies.    SOCIAL HISTORY:   Social History     Tobacco Use    Smoking status: Never     Passive exposure: Current    Smokeless tobacco: Never   Substance Use Topics    Alcohol use: Not Currently     Comment: No longer       FAMILY HISTORY:  Family History   Problem Relation Age of Onset    Diabetes Mother     Cancer Mother     No Known Problems Father     No Known Problems Sister     Diabetes Maternal Grandmother     Hypertension Maternal Grandmother     Cancer Maternal Grandfather     Diabetes Maternal Grandfather     No Known Problems Paternal Grandmother     Skin cancer Paternal Grandfather     Stroke Paternal Grandfather     No Known Problems Brother     No Known Problems Maternal Aunt     No Known Problems Maternal Uncle     No Known Problems Paternal Aunt     No Known Problems Paternal Uncle     No Known Problems Other     Melanoma Neg Hx     Basal  cell carcinoma Neg Hx     Squamous cell carcinoma Neg Hx     Substance Abuse Disorder Neg Hx     Alcohol abuse Neg Hx     Drug abuse Neg Hx     Mental illness Neg Hx     Amblyopia Neg Hx     Blindness Neg Hx     Cataracts Neg Hx     Glaucoma Neg Hx     Macular degeneration Neg Hx     Retinal detachment Neg Hx     Strabismus Neg Hx     Thyroid disease Neg Hx     Breast cancer Neg Hx         Radiology     No orders to display       Labs     Labs Reviewed   COMPREHENSIVE METABOLIC PANEL - Abnormal; Notable for the following components:       Result Value    Sodium 122 (*)     Chloride 88 (*)     Glucose 448 (*)     Albumin 2.2 (*)     Total Protein 8.5 (*)     Total Bilirubin 4.6 (*)     AST 58 (*)     Alkaline Phosphatase 335 (*)     All other components within normal limits   PROTIME-INR - Abnormal; Notable for the following components:    PT 14.0 (*)     All other components within normal limits   PRO-BNP - Abnormal; Notable for the following components:    PRO-BNP 655.0 (*)     All other components within normal limits    Narrative:     Effective 08/10/23, NT-proBNP replaces BNP, results are not interchangeable. Values less than 300 pg/mL strongly rule out the presence of acute heart failure.   LIPASE - Abnormal; Notable for the following components:    Lipase 214 (*)     All other components within normal limits   BLOOD GAS, VENOUS - Abnormal; Notable for the following components:    pO2, Ven 19 (*)     Base Excess, Ven 3.6 (*)     O2 Saturation, Venous 24.3 (*)     All other components within normal limits   POCT GLUCOSE, INTERFACED - Abnormal; Notable for the following components:    Glucose, POC 454 (*)     All other components within normal limits   URINALYSIS WITH MICROSCOPY WITH CULTURE REFLEX PERFORMABLE - Abnormal; Notable for the following components:    Glucose, UA >1000 mg/dL (*)     Urobilinogen, UA 2.0 mg/dL (*)     Mucus, UA Rare (*)     All other components within normal limits   CBC W/ AUTO DIFF - Abnormal; Notable for the following components:    HGB 9.8 (*)     HCT 30.2 (*)     MCV 74.9 (*)     MCH 24.3 (*)     RDW 18.3 (*)     Absolute Lymphocytes 0.8 (*)     Microcytosis Slight (*)     Anisocytosis Slight (*)     All other components within normal limits   MAGNESIUM - Normal   PHOSPHORUS - Normal   HIGH SENSITIVITY TROPONIN I - SINGLE - Normal   AMMONIA - Normal   EXTRA TUBES    Narrative:     The following orders were created for panel order ED Extra Tubes.  Procedure                               Abnormality         Status                                     ---------                               -----------         ------                                     GREEN LITHIUM HEPARIN E.Marland KitchenMarland Kitchen[[id number]]                      Final result                               LAVENDER EDTA EXTRA NGEX[[id number]]                        Final result                               LIGHT BLUE CITRATE EXTR.Marland KitchenMarland Kitchen[[id number]]                      Final result                               DARK GREEN EXTRA QIHK[[id number]]                           Final result                                                 Please view results for these tests on the individual orders.   CBC W/ DIFFERENTIAL    Narrative:     The following orders were created for panel order CBC w/ Differential.                  Procedure                               Abnormality         Status                                     ---------                               -----------         ------  CBC w/ [id number]]         Abnormal            Final result                                                 Please view results for these tests on the individual orders.   APTT   URINALYSIS WITH MICROSCOPY WITH CULTURE REFLEX    Narrative:     The following orders were created for panel order Urinalysis with Microscopy with Culture Reflex.                  Procedure                               Abnormality         Status                                     ---------                               -----------         ------                                     Urinalysis with Microsc.Marland KitchenMarland Kitchen[[id number]]  Abnormal            Final result                                                 Please view results for these tests on the individual orders.   GREEN LITHIUM HEPARIN EXTRA TUBE   LAVENDER EDTA EXTRA TUBE   LIGHT BLUE CITRATE EXTRA TUBE   DARK GREEN EXTRA TUBE       Pertinent labs & imaging results that were available during my care of the patient were reviewed by me and considered in my medical decision making (see chart for details).    Please note- This chart has been created using [employer]. Chart creation errors have been sought, but may not always be located and such creation errors do NOT reflect on the standard of medical care.       Peggyann Juba, MD  Resident  10/15/23 [phone]

## 2023-10-12 NOTE — Unmapped (Signed)
Pt reporting abdominal pain worse after meals, and jaw pain x 4 days, has been referred to a periodontist. She has not been able to wear her C-PAP due to the pain. Endorsing chills no fever. Some diarrhea yesterday.

## 2023-10-16 ENCOUNTER — Ambulatory Visit
Admit: 2023-10-16 | Discharge: 2023-10-17 | Disposition: A | Discharge status: Home / Self Care | Payer: PRIVATE HEALTH INSURANCE

## 2023-10-16 ENCOUNTER — Emergency Department
Admit: 2023-10-16 | Discharge: 2023-10-17 | Disposition: A | Discharge status: Home / Self Care | Payer: PRIVATE HEALTH INSURANCE

## 2023-10-16 DIAGNOSIS — K047 Periapical abscess without sinus: Principal | ICD-10-CM

## 2023-10-16 DIAGNOSIS — K0889 Other specified disorders of teeth and supporting structures: Principal | ICD-10-CM

## 2023-10-16 DIAGNOSIS — R739 Hyperglycemia, unspecified: Principal | ICD-10-CM

## 2023-10-16 LAB — CBC W/ AUTO DIFF
BASOPHILS ABSOLUTE COUNT: 0 10*9/L (ref 0.0–0.1)
BASOPHILS RELATIVE PERCENT: 0.4 %
EOSINOPHILS ABSOLUTE COUNT: 0.1 10*9/L (ref 0.0–0.5)
EOSINOPHILS RELATIVE PERCENT: 1.7 %
HEMATOCRIT: 29 % — ABNORMAL LOW (ref 34.0–44.0)
HEMOGLOBIN: 9.8 g/dL — ABNORMAL LOW (ref 11.3–14.9)
LYMPHOCYTES ABSOLUTE COUNT: 0.8 10*9/L — ABNORMAL LOW (ref 1.1–3.6)
LYMPHOCYTES RELATIVE PERCENT: 15.1 %
MEAN CORPUSCULAR HEMOGLOBIN CONC: 33.8 g/dL (ref 32.0–36.0)
MEAN CORPUSCULAR HEMOGLOBIN: 24.9 pg — ABNORMAL LOW (ref 25.9–32.4)
MEAN CORPUSCULAR VOLUME: 73.9 fL — ABNORMAL LOW (ref 77.6–95.7)
MEAN PLATELET VOLUME: 8.4 fL (ref 6.8–10.7)
MONOCYTES ABSOLUTE COUNT: 0.7 10*9/L (ref 0.3–0.8)
MONOCYTES RELATIVE PERCENT: 13.5 %
NEUTROPHILS ABSOLUTE COUNT: 3.5 10*9/L (ref 1.8–7.8)
NEUTROPHILS RELATIVE PERCENT: 69.3 %
PLATELET COUNT: 146 10*9/L — ABNORMAL LOW (ref 150–450)
RED BLOOD CELL COUNT: 3.92 10*12/L — ABNORMAL LOW (ref 3.95–5.13)
RED CELL DISTRIBUTION WIDTH: 18.5 % — ABNORMAL HIGH (ref 12.2–15.2)
WBC ADJUSTED: 5.1 10*9/L (ref 3.6–11.2)

## 2023-10-16 LAB — COMPREHENSIVE METABOLIC PANEL
ALBUMIN: 2.5 g/dL — ABNORMAL LOW (ref 3.4–5.0)
ALKALINE PHOSPHATASE: 337 U/L — ABNORMAL HIGH (ref 46–116)
ALT (SGPT): 37 U/L (ref 10–49)
ANION GAP: 10 mmol/L (ref 5–14)
AST (SGOT): 56 U/L — ABNORMAL HIGH (ref <=34)
BILIRUBIN TOTAL: 6.5 mg/dL — ABNORMAL HIGH (ref 0.3–1.2)
BLOOD UREA NITROGEN: 26 mg/dL — ABNORMAL HIGH (ref 9–23)
BUN / CREAT RATIO: 30
CALCIUM: 8.8 mg/dL (ref 8.7–10.4)
CHLORIDE: 89 mmol/L — ABNORMAL LOW (ref 98–107)
CO2: 23 mmol/L (ref 20.0–31.0)
CREATININE: 0.87 mg/dL
EGFR CKD-EPI (2021) FEMALE: 77 mL/min/{1.73_m2} (ref >=60)
GLUCOSE RANDOM: 505 mg/dL (ref 70–179)
POTASSIUM: 4.8 mmol/L (ref 3.4–4.8)
PROTEIN TOTAL: 8.7 g/dL — ABNORMAL HIGH (ref 5.7–8.2)
SODIUM: 122 mmol/L — ABNORMAL LOW (ref 135–145)

## 2023-10-16 LAB — SLIDE REVIEW

## 2023-10-16 MED ORDER — AMOXICILLIN 875 MG-POTASSIUM CLAVULANATE 125 MG TABLET
ORAL_TABLET | Freq: Two times a day (BID) | ORAL | 0 refills | 10 days | Status: CP
Start: 2023-10-16 — End: 2023-10-26

## 2023-10-16 MED ADMIN — morphine 4 mg/mL injection 4 mg: 4 mg | INTRAVENOUS | @ 22:00:00 | Stop: 2023-10-16

## 2023-10-16 MED ADMIN — ondansetron (ZOFRAN) injection 4 mg: 4 mg | INTRAVENOUS | @ 22:00:00 | Stop: 2023-10-16

## 2023-10-16 MED ADMIN — iohexol (OMNIPAQUE) 350 mg iodine/mL solution 75 mL: 75 mL | INTRAVENOUS | Stop: 2023-10-16

## 2023-10-16 MED ADMIN — insulin lispro (HumaLOG) injection 10 Units: 10 [IU] | SUBCUTANEOUS | @ 23:00:00 | Stop: 2023-10-16

## 2023-10-16 NOTE — Unmapped (Signed)
Pt arrives as advised by PCP r/t dental infection that may be into bone. Here for CT.

## 2023-10-16 NOTE — Unmapped (Signed)
[facility]  Emergency Department Provider Note      ED Clinical Impression       Diagnosis ICD-10-CM Associated Orders   1. Pain, dental  K08.89                Impression, Medical Decision Making, Progress Notes and Critical Care      Ashley Briggs is a 57 y.o. female with a past medical history of endometrial cancer,  CAD, T2DM, hepatic steatosis, HTN, HLD,  OSA, and anxiety/depression who presents with 1 week of left-sided tooth pain and swelling with associated nausea and chills.    On exam, the patient is alert and oriented and in no acute distress. Vital signs are within normal limits. Physical exam revealed mild tenderness to palpation of the right mandibular area. No erythema. Dentition is poor but there is no fluctuance.      Considering dental abscess, sialolithiasis, sialoadenitis.  Plan for labs, CT, symptom control, reassessment.    ED Course as of 10/16/23 1637   Fri Oct 16, 2023   1637 Care signed out to oncoming team who will follow-up results and reassess the patient.             Additional MDM Elements                          Portions of this record have been created using Dragon dictation software. Dictation errors have been sought, but may not have been identified and corrected.    See chart and nursing documentation for additional ED course details.           History        Reason for Visit  Dental Pain      HPI   Ashley Briggs is a 57 y.o. female with a past medical history of endometrial cancer, CAD, T2DM, hepatic steatosis, HTN, HLD, arthritis, insomnia, OSA, and anxiety/depression who presents with dental pain in the setting of possible infection. The patient reports 1 week of left-sided tooth pain and swelling with associated nausea and chills. She reports presenting to [facility] ED on Monday (10/12/23) where she was told that they were concerned that her tooth was infected. She states that she was supposed to be discharged with antibiotics but she did not see a prescription written up in her discharge papers. Of note, she has been referred to a periodontist. She has taken Tylenol with no symptomatic relief. She denies any medication allergies. She denies any drainage from the site, fever, or emesis.     Per chart review, the patient presented to [facility] ED (10/12/23) with abdominal pain and bilateral jaw pain. Her workup was notable for elevated lipase to 214, elevated sodium to 122, and elevated glucose to 454. After improvement with insulin, morphine, and Toradol, the patient was subsequently discharged.     Outside Historian(s): N/A        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    Cataract associated with type 2 diabetes mellitus (CMS-HCC) 12/21/2020    Cirrhosis (CMS-HCC)     Coronary artery disease involving native heart without angina pectoris 02/05/2021    Current moderate episode of major depressive disorder (CMS-HCC) 11/06/2022    Diabetes mellitus (CMS-HCC)     Diabetic macular  edema of right eye with mild nonproliferative retinopathy associated with type 2 diabetes mellitus (CMS-HCC) 12/02/2022    Dry eye syndrome, bilateral 12/21/2020    Dysphagia 02/02/2023    Endometrial cancer (CMS-HCC) 2007    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    Liver disease     Major depression Morbid obesity (CMS-HCC) 11/10/2019    Myopia of both eyes with astigmatism and presbyopia 12/02/2022    Nausea 09/22/2022    Non-alcoholic fatty liver disease     NPDR (nonproliferative diabetic retinopathy) (CMS-HCC)     Right eye    Other ascites 09/22/2022    Other chronic pain 11/06/2022    Other insomnia 11/06/2022    PFD (pelvic floor dysfunction) 04/07/2013    Portal hypertension (CMS-HCC) 10/08/2022    Psoriasis     Ptosis of eyelid, left     Left upper eyelid    Reflux     Retinopathy of left eye, background, proliferative 01/09/2021    Right upper quadrant abdominal pain 09/22/2022    Secondary esophageal varices without bleeding (CMS-HCC) 06/17/2022    Type 2 diabetes mellitus, with long-term current use of insulin (CMS-HCC) 12/12/2015    Last Assessment & Plan:   The current medical regimen is effective;  continue present plan and medications.  Patient will continue good management of diabetes especially with weight loss. Hopefully with further weight loss will run into the problem of being overmedicated and having to cut back on medications     Some confusion on diabetes medications was have not.been refilled for over a year.  Pa       Past Surgical History:   Procedure Laterality Date    CHOLECYSTECTOMY      HYSTERECTOMY      endometrial cancer    HYSTERECTOMY      OOPHORECTOMY      PR ANAL PRESSURE RECORD Left 03/31/2013    Procedure: ANORECTAL MANOMETRY;  Surgeon: None None;  [facility];  Service: Gastroenterology    PR BREATH HYDROGEN TEST N/A 03/31/2013    Procedure: BREATH HYDROGEN TEST;  Surgeon: None None;  [facility]: [facility];  Service: Gastroenterology    PR COLSC FLX W/RMVL OF TUMOR POLYP LESION SNARE TQ N/A 09/06/2020    Procedure: COLONOSCOPY FLEX; W/REMOV TUMOR/LES BY SNARE;  Surgeon: Chriss Driver, MD;  Location: [facility];  Service: Gastroenterology    PR UPPER GI ENDOSCOPY,BIOPSY N/A 04/15/2013    Procedure: UGI ENDOSCOPY; WITH BIOPSY, SINGLE OR MULTIPLE;  Surgeon: Vickii Chafe, MD;  [facility]: [facility];  Service: Gastroenterology    PR UPPER GI ENDOSCOPY,BIOPSY N/A 09/06/2020    Procedure: UGI ENDOSCOPY; WITH BIOPSY, SINGLE OR MULTIPLE;  Surgeon: Chriss Driver, MD;  Location: [facility];  Service: Gastroenterology    PR  UPPER GI ENDOSCOPY,BIOPSY N/A 05/05/2022    Procedure: UGI ENDOSCOPY; WITH BIOPSY, SINGLE OR MULTIPLE;  Surgeon: Chriss Driver, MD;  [facility];  Service: Gastroenterology    PR UPPER GI ENDOSCOPY,BIOPSY N/A 01/22/2023    Procedure: UGI ENDOSCOPY; WITH BIOPSY, SINGLE OR MULTIPLE;  Surgeon: Vonda Antigua, MD;  Location: [facility];  Service: Gastroenterology         Current Facility-Administered Medications:     morphine 4 mg/mL injection 4 mg, 4 mg, Intravenous, Once, Matilde Haymaker, Marlyne Beards, MD    ondansetron [facility]) injection 4 mg, 4 mg, Intravenous, Once, Matilde Haymaker, Marlyne Beards, MD    Current Outpatient Medications:     acetaminophen (TYLENOL) 325 MG tablet, Take 2 tablets (650 mg total) by mouth two (2) times a day as needed for pain., Disp: 180 tablet, Rfl: 1    aspirin (ECOTRIN) 81 MG tablet, Take 1 tablet (81 mg total) by mouth daily., Disp: 90 tablet, Rfl: 3    baclofen (LIORESAL) 5 mg Tab tablet, Increase to 10mg  nightly., Disp: 60 tablet, Rfl: 11    blood sugar diagnostic Strp, Test two (2) times a day (30 minutes before a meal)., Disp: 100 strip, Rfl: 1    blood-glucose meter,continuous (DEXCOM G7 RECEIVER) Misc, Use as instructed with G7 sensors., Disp: 1 each, Rfl: 1    blood-glucose sensor (DEXCOM G7 SENSOR) Devi, Use to monitor blood glucose levels continuously. Change sensor every 10 days., Disp: 9 each, Rfl: 3    buPROPion (WELLBUTRIN XL) 150 MG 24 hr tablet, Take 1 tablet (150 mg total) by mouth daily., Disp: , Rfl:     canagliflozin (INVOKANA) 300 mg Tab tablet, Take 1 tablet (300 mg total) by mouth daily before breakfast., Disp: 90 tablet, Rfl: 0    carvedilol (COREG) 6.25 MG tablet, Take 1 tablet (6.25 mg total) by mouth two (2) times a day., Disp: 60 tablet, Rfl: 11    chlorhexidine (PERIDEX) 0.12 % solution, 15 mL by Mouth route daily., Disp: , Rfl:     cholestyramine-aspartame (CHOLESTYRAMINE LIGHT) 4 gram PwPk, Mix 1 packet as directed and take by mouth daily., Disp: 60 packet, Rfl: 5    clotrimazole (MYCELEX) 10 mg troche, Dissolve 1 tablet (10 mg total) in mouth five (5) times a day., Disp: 20 tablet, Rfl: 0    dulaglutide (TRULICITY) 1.5 mg/0.5 mL PnIj, Inject 0.5 mL (1.5 mg total) under the skin every seven (7) days., Disp: 4 mL, Rfl: 6    empty container Misc, Use as directed to dispose of Cosentyx pens., Disp: 1 each, Rfl: 2    ferrous sulfate (SLOW FE) 142 mg (45 mg iron) TbER, Take 1 tablet (142 mg) by mouth two (2) times a day., Disp: 60 tablet, Rfl: 3    furosemide (LASIX) 40 MG tablet, Take 1 tablet (40 mg total) by mouth two (2) times a day., Disp: 60 tablet, Rfl: 11    gabapentin (NEURONTIN) 300 MG capsule, Take 1 capsule (300 mg total) by mouth Three (3) times a day., Disp: 90 capsule, Rfl: 0    gabapentin (NEURONTIN) 300 MG capsule, Take by mouth., Disp: , Rfl:     insulin lispro (HUMALOG) 100 unit/mL injection pen, Inject 10 Units under the skin Three (3) times a day before meals., Disp: 15 mL, Rfl: 2    lactulose 10 gram/15 mL solution, Take 30 mL (20 g total) by mouth Three (3) times a day., Disp: 2700 mL, Rfl: 11  lancets (ACCU-CHEK SOFTCLIX LANCETS) Misc, Use as directed to test once daily., Disp: 100 each, Rfl: 1    lidocaine (ASPERCREME) 4 % patch, Place 1 patch on the skin daily. Apply for 12 hours then remove for 12 hours., Disp: 15 patch, Rfl: 0    magnesium oxide (MAG-OX) 400 mg (241.3 mg elemental magnesium) tablet, Take 2 tablets (800 mg total) by mouth two (2) times a day., Disp: 120 tablet, Rfl: 11    metFORMIN (GLUCOPHAGE-XR) 500 MG 24 hr tablet, TAKE 2 TABLETS BY MOUTH IN THE MORNING AND 2 IN THE EVENING WITH MEALS, Disp: 360 tablet, Rfl: 0    mirtazapine (REMERON) 7.5 MG tablet, Take 1 tablet (7.5 mg total) by mouth., Disp: , Rfl:     ondansetron (ZOFRAN-ODT) 4 MG disintegrating tablet, Dissolve 1 tablet (4 mg total) in the mouth every twelve (12) hours as needed for nausea., Disp: 30 tablet, Rfl: 0    pantoprazole (PROTONIX) 40 MG tablet, Take 1 tablet (40 mg total) by mouth two (2) times a day., Disp: 180 tablet, Rfl: 1    pen needle, diabetic (ULTICARE PEN NEEDLE) 32 gauge x 1/4 (6 mm) Ndle, Inject 1 each under the skin nightly., Disp: 100 each, Rfl: 1    promethazine (PHENERGAN) 12.5 MG tablet, Take 1 tablet (12.5 mg total) by mouth every eight (8) hours as needed for nausea., Disp: 60 tablet, Rfl: 1    rifAXIMin (XIFAXAN) 550 mg Tab, Take 1 tablet (550 mg total) by mouth two (2) times a day., Disp: 60 tablet, Rfl: 11    secukinumab (COSENTYX PEN) 150 mg/mL PnIj injection, Inject the contents of 2 pens (300 mg total) under the skin once a week at weeks 0, 1, 2, 3, and 4. THEN inject the contents of 2 pens (300 mg total) every 4 weeks thereafter., Disp: 10 mL, Rfl: 0    secukinumab (COSENTYX PEN, 2 PENS,) 150 mg/mL PnIj injection, Inject the contents of 2 pens (300 mg total) under the skin every twenty-eight (28) days. Maintenance dose, Disp: 2 mL, Rfl: 5    SEMGLEE,INSULIN GLARG-YFGN,PEN 100 unit/mL (3 mL) InPn, INJECT 50 UNITS UNDER THE SKIN NIGHTLY AS DIRECTED BY YOUR DOCTOR AROUND SAME TIME EVERY DAY. TOTAL DAILY DOSE NOT TO EXCEED 50 UNITS, Disp: 15 mL, Rfl: 0    simvastatin (ZOCOR) 20 MG tablet, Take 1 tablet (20 mg total) by mouth every evening., Disp: 90 tablet, Rfl: 0    spironolactone (ALDACTONE) 100 MG tablet, Take 1 tablet (100 mg total) by mouth two (2) times a day., Disp: 60 tablet, Rfl: 11    zolpidem (AMBIEN) 5 MG tablet, Take 1 tablet (5 mg total) by mouth nightly as needed., Disp: , Rfl:     Allergies  Patient has no known allergies.    Family History Problem Relation Age of Onset    Diabetes Mother     Cancer Mother     No Known Problems Father     No Known Problems Sister     Diabetes Maternal Grandmother     Hypertension Maternal Grandmother     Cancer Maternal Grandfather     Diabetes Maternal Grandfather     No Known Problems Paternal Grandmother     Skin cancer Paternal Grandfather     Stroke Paternal Grandfather     No Known Problems Brother     No Known Problems Maternal Aunt     No Known Problems Maternal Uncle     No Known Problems Paternal Aunt  No Known Problems Paternal Uncle     No Known Problems Other     Melanoma Neg Hx     Basal cell carcinoma Neg Hx     Squamous cell carcinoma Neg Hx     Substance Abuse Disorder Neg Hx     Alcohol abuse Neg Hx     Drug abuse Neg Hx     Mental illness Neg Hx     Amblyopia Neg Hx     Blindness Neg Hx     Cataracts Neg Hx     Glaucoma Neg Hx     Macular degeneration Neg Hx     Retinal detachment Neg Hx     Strabismus Neg Hx     Thyroid disease Neg Hx     Breast cancer Neg Hx        Social History     Tobacco Use    Smoking status: Never     Passive exposure: Current    Smokeless tobacco: Never   Vaping Use    Vaping status: Never Used   Substance Use Topics    Alcohol use: Not Currently     Comment: No longer    Drug use: Not Currently          Physical Exam     ED Triage Vitals   Enc Vitals Group      BP 10/16/23 1521 132/70      Heart Rate 10/16/23 1517 104      SpO2 Pulse 10/16/23 1517 104      Resp 10/16/23 1521 16      Temp 10/16/23 1521 36.5 ??C (97.7 ??F)      Temp Source 10/16/23 1521 Oral      SpO2 10/16/23 1517 100 %      Weight 10/16/23 1521 97.7 kg (215 lb 6.2 oz)     Constitutional: Alert and oriented. Well appearing and in no distress.  Eyes: Conjunctivae are normal.  ENT       Head: Normocephalic and atraumatic. No facial swelling.       Nose: No congestion.       Mouth/Throat: Mild tenderness to palpation of the right mandibular area. No erythema. Dentition is poor but there is no fluctuance. Mucous membranes are moist.       Neck: No stridor.  Cardiovascular: Normal rate, regular rhythm.   Respiratory: Normal respiratory effort. Breath sounds are normal.  Gastrointestinal: Abdomen soft and nontender throughout. There is no CVA tenderness.  Musculoskeletal: Normal range of motion in all extremities.       Right lower leg: No tenderness or edema.       Left lower leg: No tenderness or edema.  Neurologic: Normal speech and language. No gross focal neurologic deficits are appreciated.  Skin: Skin is warm, dry and intact. No rash noted.  Psychiatric: Mood and affect are normal. Speech and behavior are normal.     Radiology     CT Maxillofacial W Contrast    (Results Pending)       October 16, 2023 3:30 PM. Documentation assistance provided by the scribe. I was present during the time the encounter was recorded. The information recorded by the scribe was done at my direction and has been reviewed and validated by me.     Documentation assistance was provided by Humberto Seals, Scribe on October 16, 2023 at 3:35 PM for Wylene Simmer, MD.              Mauri Pole,  MD  10/16/23 1638
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
## 2023-10-17 MED ADMIN — amoxicillin-clavulanate (AUGMENTIN) 875-125 mg per tablet 1 tablet: 1 | ORAL | @ 01:00:00 | Stop: 2023-10-16

## 2023-10-17 MED ADMIN — oxyCODONE (ROXICODONE) immediate release tablet 5 mg: 5 mg | ORAL | @ 01:00:00 | Stop: 2023-10-16

## 2023-10-17 NOTE — Unmapped (Signed)
ED Progress Note    Disposition: DC    Final diagnoses:   Pain, dental (Primary)   Dental infection   Hyperglycemia       Assumed care of patient from the previous team.     ED Course as of 10/16/23 2109   [facility] Oct 16, 2023   1704 Assumed care of patient from previous team.  Please see their note for full details.  In short, 1 week of jaw pain, pending CT max/fac for possible abscess. Pending labs as well.   1733 Comprehensive Metabolic Panel(!!):    Sodium 841(!)   Potassium 4.8   Chloride 89(!)   CO2 23.0   Anion Gap 10   Bun 26(!)   Creatinine 0.87   BUN/Creatinine Ratio 30   eGFR CKD-EPI (2021) Female 77   Glucose 505(!!)   Calcium 8.8   Albumin 2.5(!)   Total Protein 8.7(!)   Total Bilirubin 6.5(!)   SGOT (AST) 56(!)   ALT 37   Alkaline Phosphatase 337(!)  No DKA. Will provide 10 units insulin.   1936 CT Maxillofacial W Contrast   1936 Glucose, POC(!!): 407   1939 Discussed CT findings with the patient.  Patient has already set up an appointment with her dentist in 9 days.  Will start Augmentin for home.  No abscess to drain.  Once tooth is no longer hot dentist can go ahead and extract.  Will provide results and discharge paperwork to help continue to care.  Patient is agreeable.  Patient has insulin at home and a way to monitor.  Understands risks of DKA and will continue to follow.       Vitals:    10/16/23 1521   BP: 132/70   Pulse: 98   Resp: 16   Temp: 36.5 ??C (97.7 ??F)   SpO2: 100%       [place] Stell R Brissia Delisa, DO  9:09 PM

## 2023-10-19 ENCOUNTER — Ambulatory Visit (INDEPENDENT_AMBULATORY_CARE_PROVIDER_SITE_OTHER): Payer: Medicaid Other | Admitting: Psychiatry

## 2023-10-19 ENCOUNTER — Encounter: Payer: Self-pay | Admitting: Psychiatry

## 2023-10-19 VITALS — BP 104/57 | HR 86 | Temp 96.9°F | Ht 64.0 in | Wt 219.2 lb

## 2023-10-19 DIAGNOSIS — F4010 Social phobia, unspecified: Secondary | ICD-10-CM | POA: Diagnosis not present

## 2023-10-19 DIAGNOSIS — G4700 Insomnia, unspecified: Secondary | ICD-10-CM | POA: Diagnosis not present

## 2023-10-19 DIAGNOSIS — F331 Major depressive disorder, recurrent, moderate: Secondary | ICD-10-CM | POA: Diagnosis not present

## 2023-10-19 MED ORDER — MIRTAZAPINE 15 MG PO TABS: 15.0000 mg | ORAL_TABLET | Freq: Every day | ORAL | 1 refills | Status: DC

## 2023-10-19 MED ORDER — BUPROPION HCL ER (XL) 150 MG PO TB24: 150.0000 mg | ORAL_TABLET | Freq: Every day | ORAL | 3 refills | Status: DC

## 2023-10-19 MED ORDER — ZOLPIDEM TARTRATE 5 MG PO TABS: 5.0000 mg | ORAL_TABLET | Freq: Every evening | ORAL | 1 refills | Status: DC | PRN

## 2023-10-19 NOTE — Patient Instructions (Signed)
Continue bupropion 150 mg daily (likely max dose given MELD score) Continue citalopram 20 mg daily  Continue mirtazapine 15 mg at night  Continue Ambien 5 mg at night as needed for insomnia Referred to therapy (wait list) Please contact your provider regarding your recent blood test (low Na, high glucose level) Next appointment: 1/13 at 3 PM

## 2023-10-26 ENCOUNTER — Ambulatory Visit
Admit: 2023-10-26 | Discharge: 2023-10-27 | Payer: PRIVATE HEALTH INSURANCE | Attending: Student in an Organized Health Care Education/Training Program | Primary: Student in an Organized Health Care Education/Training Program

## 2023-10-26 DIAGNOSIS — I10 Essential (primary) hypertension: Principal | ICD-10-CM

## 2023-10-26 DIAGNOSIS — Z794 Long term (current) use of insulin: Principal | ICD-10-CM

## 2023-10-26 DIAGNOSIS — Z1231 Encounter for screening mammogram for malignant neoplasm of breast: Principal | ICD-10-CM

## 2023-10-26 DIAGNOSIS — K7460 Unspecified cirrhosis of liver: Principal | ICD-10-CM

## 2023-10-26 DIAGNOSIS — D649 Anemia, unspecified: Principal | ICD-10-CM

## 2023-10-26 DIAGNOSIS — K858 Other acute pancreatitis without necrosis or infection: Principal | ICD-10-CM

## 2023-10-26 DIAGNOSIS — E113512 Type 2 diabetes mellitus with proliferative diabetic retinopathy with macular edema, left eye: Principal | ICD-10-CM

## 2023-10-26 LAB — COMPREHENSIVE METABOLIC PANEL
ALBUMIN: 2.5 g/dL — ABNORMAL LOW (ref 3.4–5.0)
ALKALINE PHOSPHATASE: 319 U/L — ABNORMAL HIGH (ref 46–116)
ALT (SGPT): 34 U/L (ref 10–49)
ANION GAP: 12 mmol/L (ref 5–14)
AST (SGOT): 59 U/L — ABNORMAL HIGH (ref <=34)
BILIRUBIN TOTAL: 5.8 mg/dL — ABNORMAL HIGH (ref 0.3–1.2)
BLOOD UREA NITROGEN: 20 mg/dL (ref 9–23)
BUN / CREAT RATIO: 26
CALCIUM: 9.1 mg/dL (ref 8.7–10.4)
CHLORIDE: 94 mmol/L — ABNORMAL LOW (ref 98–107)
CO2: 23 mmol/L (ref 20.0–31.0)
CREATININE: 0.78 mg/dL (ref 0.55–1.02)
EGFR CKD-EPI (2021) FEMALE: 88 mL/min/{1.73_m2} (ref >=60)
GLUCOSE RANDOM: 186 mg/dL — ABNORMAL HIGH (ref 70–179)
POTASSIUM: 4.5 mmol/L (ref 3.5–5.1)
PROTEIN TOTAL: 8.5 g/dL — ABNORMAL HIGH (ref 5.7–8.2)
SODIUM: 129 mmol/L — ABNORMAL LOW (ref 135–145)

## 2023-10-26 LAB — CBC W/ AUTO DIFF
BASOPHILS ABSOLUTE COUNT: 0 10*9/L (ref 0.0–0.1)
BASOPHILS RELATIVE PERCENT: 0.8 %
EOSINOPHILS ABSOLUTE COUNT: 0.1 10*9/L (ref 0.0–0.5)
EOSINOPHILS RELATIVE PERCENT: 1.9 %
HEMATOCRIT: 29 % — ABNORMAL LOW (ref 34.0–44.0)
HEMOGLOBIN: 9.4 g/dL — ABNORMAL LOW (ref 11.3–14.9)
LYMPHOCYTES ABSOLUTE COUNT: 0.8 10*9/L — ABNORMAL LOW (ref 1.1–3.6)
LYMPHOCYTES RELATIVE PERCENT: 17.8 %
MEAN CORPUSCULAR HEMOGLOBIN CONC: 32.6 g/dL (ref 32.0–36.0)
MEAN CORPUSCULAR HEMOGLOBIN: 24.2 pg — ABNORMAL LOW (ref 25.9–32.4)
MEAN CORPUSCULAR VOLUME: 74.3 fL — ABNORMAL LOW (ref 77.6–95.7)
MEAN PLATELET VOLUME: 9.1 fL (ref 6.8–10.7)
MONOCYTES ABSOLUTE COUNT: 0.5 10*9/L (ref 0.3–0.8)
MONOCYTES RELATIVE PERCENT: 11.9 %
NEUTROPHILS ABSOLUTE COUNT: 2.9 10*9/L (ref 1.8–7.8)
NEUTROPHILS RELATIVE PERCENT: 67.6 %
PLATELET COUNT: 136 10*9/L — ABNORMAL LOW (ref 150–450)
RED BLOOD CELL COUNT: 3.91 10*12/L — ABNORMAL LOW (ref 3.95–5.13)
RED CELL DISTRIBUTION WIDTH: 19.2 % — ABNORMAL HIGH (ref 12.2–15.2)
WBC ADJUSTED: 4.3 10*9/L (ref 3.6–11.2)

## 2023-10-26 LAB — IRON & TIBC
IRON SATURATION: 6 % — ABNORMAL LOW (ref 20–55)
IRON: 23 ug/dL — ABNORMAL LOW (ref 50–170)
TOTAL IRON BINDING CAPACITY: 391 ug/dL (ref 250–425)

## 2023-10-26 LAB — FERRITIN: FERRITIN: 23.5 ng/mL (ref 7.3–270.7)

## 2023-10-26 LAB — LIPASE: LIPASE: 209 U/L — ABNORMAL HIGH (ref 12–53)

## 2023-10-26 LAB — TSH: THYROID STIMULATING HORMONE: 4.349 u[IU]/mL (ref 0.550–4.780)

## 2023-10-26 LAB — MAGNESIUM: MAGNESIUM: 1.6 mg/dL (ref 1.6–2.6)

## 2023-10-26 NOTE — Unmapped (Signed)
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   -however she did appear ill and the focus of our visit shifted to address her acute concerns   -Since my list visit with her in June, she has gone to the hospital/ER 3 times with the most recent ER visit on 10/16/2023  -We DID review her labs from this visit which noted the following    -Elevated BG of 505, no Anion gap    -Sodium 122 (correct Na  of 128)   -Renal function was stable BUT Elevated total bilirubin to 6.5   -stable microcytic anemia with stable mild thrombocytopenia  -Lipase was elevated at 214 during ER visit on 10/12/2023  -She has seen: Neurology, Orthopedics, Pulmonology as well   -She does not feel well today but when asked why she did not go to the ER, she felt like they were not assisting her   -We did additionally discuss concern regarding her BP and polypharmacy and that this could be contributing to her symptoms as well   -I advised that she should go back to the ER if her symptoms are persisting and/or worsening   -We will obtain labs listed above      Chronic care follow up in ONE month   I personally spent 33 minutes face-to-face and non-face-to-face in the care of this patient, which includes all pre, intra, and post visit time on the date of service.  All documented time was specific to the E/M visit and does not include any procedures that may have been performed.          Subjective:     Ashley Briggs 57 y.o.female   is here for follow up     She notes that she feels terrible today   She has had diarrhea for the last 1.5 week where it was black to a sickly yellow     She feels like she is carrying a bowling ball     BG this AM ws 178    She is still having issues with her teeth and gums and swallowing     She notes the headacehs are still bad but the increased dose of the baclofen has not caused worsenign symptoms     Decemeber 2nd for pain management     Patient's last menstrual period was 08/03/2006.        ROS:     ROS negative unless otherwise noted in HPI    Vital Signs:     Wt Readings from Last 3 Encounters:   10/26/23 95.8 kg (211 lb 1.6 oz)   10/16/23 97.7  kg (215 lb 6.2 oz)   10/12/23 93.9 kg (207 lb)     Wt Readings from Last 6 Encounters:   10/26/23 95.8 kg (211 lb 1.6 oz)   10/16/23 97.7 kg (215 lb 6.2 oz)   10/12/23 93.9 kg (207 lb)   10/07/23 96.2 kg (212 lb)   09/30/23 (!) 103.8 kg (228 lb 14.4 oz)   09/04/23 (!) 103.7 kg (228 lb 9.6 oz)        Temp Readings from Last 3 Encounters:   10/26/23 36.6 ??C (97.8 ??F) (Temporal)   10/16/23 36.5 ??C (97.7 ??F) (Oral)   10/12/23 36.8 ??C (98.3 ??F) (Oral)     BP Readings from Last 3 Encounters:   10/26/23 96/50   10/16/23 132/70   10/12/23 96/61     Pulse Readings from Last 3 Encounters:   10/26/23 80   10/16/23 98   10/12/23 76       Objective:     General Appearance: Alert, cooperative. Mildly ill appearing however  Head and Neck: Normocephalic, atraumatic.   EENT: Wearing glasses. Sclera icterus present. Dry mouth   Cardiovascular:  Regular rate and rhythm. No murmurs.    Respiratory: Lungs clear to auscultation bilaterally,  Respiratory effort unremarkable. No wheezes or rhonchi.   Abdomen: soft, not tender to palpation. distended  Musculoskeletal: using cane with ambulation.   Integumentary: skin does appear jaundiced  Neurologic: Alert and oriented x3 CN II-XII grossly intact.   Psychiatric: Mood and affect appropriate for situation.

## 2023-10-27 DIAGNOSIS — K7682 Hepatic encephalopathy (CMS-HCC): Principal | ICD-10-CM

## 2023-10-27 LAB — SLIDE REVIEW

## 2023-10-27 NOTE — Unmapped (Signed)
Xifaxan PA requested.    Shon Baton, MSN-Ed, RN,   Nursing Coordinator for   Dr. Eudelia Bunch & Betsey Holiday., PA-C  [facility]   [phone]

## 2023-10-29 NOTE — Unmapped (Signed)
[facility] ([facility])  [facility] Faculty Consultation Visit Note         REFERRING PHYSICIAN:    Kennedy Bucker, MD  [address]  [place],  [place] [address]    PRIMARY CARE PROVIDER:  Mangel, Benison Pap, DO         ASSESSMENT:     This is a 57 y.o. year old female with both oropharyngeal and thoracic dysphagia.  Endoscopy in February 2024 revealed grade 2 esophageal varices and portal hypertensive gastropathy.  Biopsies from the proximal esophagus were suspicious for lichen planus for which she was treated with a mouthwash (swish and swallow).  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  DDx includes, but is not limited to esophageal dysmotility, impaired motility, functional dysphagia, severe GERD, neuromuscular discoordination, poor compliance of UES, cricopharyngeal dysfunction, osteophytes, inadequate mastication, decrease in salivary flow, reduced lingual control in addition to absent/delayed swallowing reflex, disruption of the oropharyngeal mucosa, among others.  Today's recommendations are as follows:    PLAN:    1.  Schedule for modified barium swallow with assistance of SLP.  Phone number provided for patient to call and set up this appointment.  2.  Schedule for esophageal manometry to assess the motility of the esophagus.  Phone number provided for patient to call and set up this appointment.  3.  Follow the ups and downs of swallowing, per AVS.  4.  Continue pantoprazole 40 mg daily.  She does not need a prescription for this today.  5.  Return to the GI clinic for routine follow-up in about 3 months.  Patient is to reach out to me in the interim and has my contact information to do so.    [ This dictation was done with [facility] Medical Naturally Speaking. Document was reviewed but inadvertent errors in transcription may be present ]    I personally spent 38 minutes face-to-face and non-face-to-face in the care of this patient, which includes all pre, intra, and post visit time on the date of service.  All documented time was specific to the E/M visit and does not include any procedures that may have been performed.         CHIEF COMPLAINT:   Ashley Briggs is a 57 y.o. female (DOB:  Aug 10, 1966) who is seen in consultation at the request of Dr Eudelia Bunch for dysphagia.    HISTORY OF PRESENT ILLNESS:    Ashley Briggs is a 57 y.o. 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  She endorses both difficulties with the actual initiation of the swallowing mechanism; as well as things getting stuck in her mid to distal esophagus after a successful swallow.  She tells me that she chokes on everything, including solids, liquids, pills, and even my saliva.  She believes some of these issues are 2/2 issues with her teeth and gums.  She endorses chronic sore throat and describes a feeling of swallowing over rocks most of the time.  After a successful swallow, she often feels that things get hung up in her esophagus.  This can happen with solids, liquids, and pills.  Fortunately, a stuck bolus will eventually pass with time.  When this  occurs, she has accompanying chest discomfort.  She endorses rare heartburn and intermittent episodes of acidic regurgitation for which she takes pantoprazole 40 mg daily.  She denies nausea and vomiting.  Her weight remains stable.    Ashley Briggs underwent an upper endoscopy on January 22, 2023 with the following results:  - Normal proximal esophagus. Biopsied to rule out lichen planus (newly diagnosed on patient's tongue).  - Grade II esophageal varices.  - Portal hypertensive gastropathy.    Pathology from proximal esophageal biopsies revealed the following:  Fragments of esophageal squamous epithelium with increased intraepithelial lymphocytic infiltrate, nonspecific; cannot exclude lichenoid esophagitis in the appropriate clinical setting.    Given suspicion of lichen planus of the esophagus, it was recommended that she follow-up with her ENT provider who gave her treatment with some type of mouthwash.  She also tells me that she is scheduled for periodontist to discuss further treatment of lichen planus and issues with her teeth and gums in March 2025.       Ashley Briggs tells me that she is scheduled in the Pain Clinic later today to establish care for chronic pain syndrome.  She endorses pain all over my body.            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 (CMS-HCC) 12/02/2022    Dry eye syndrome, bilateral 12/21/2020    Dysphagia 02/02/2023    Endometrial cancer (CMS-HCC) 2007    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    Liver disease     Major depression     Metabolic dysfunction-associated steatohepatitis (MASH)     Morbid obesity (CMS-HCC) 11/10/2019    Myopia of both eyes with astigmatism and presbyopia 12/02/2022    Nausea 09/22/2022    Non-alcoholic fatty liver disease     NPDR (nonproliferative diabetic retinopathy) (CMS-HCC)     Right eye    Other ascites 09/22/2022    Other chronic pain 11/06/2022    Other insomnia 11/06/2022    PFD (pelvic floor dysfunction) 04/07/2013    Portal hypertension (CMS-HCC) 10/08/2022    Psoriasis     Ptosis  of eyelid, left     Left upper eyelid    Reflux     Retinopathy of left eye, background, proliferative 01/09/2021    Right upper quadrant abdominal pain 09/22/2022    Secondary esophageal varices without bleeding (CMS-HCC) 06/17/2022    Type 2 diabetes mellitus, with long-term current use of insulin (CMS-HCC) 12/12/2015    Last Assessment & Plan:   The current medical regimen is effective;  continue present plan and medications.  Patient will continue good management of diabetes especially with weight loss. Hopefully with further weight loss will run into the problem of being overmedicated and having to cut back on medications     Some confusion on diabetes medications was have not.been refilled for over a year.  Pa     Patient Active Problem List    Diagnosis Date Noted    Left upper quadrant pain 08/17/2023    OSA (obstructive sleep apnea) 05/08/2023    Dysphagia 02/02/2023    Generalized pruritus 02/02/2023    Lichen planus of tongue 01/03/2023    Myopia of both eyes with astigmatism and presbyopia 12/02/2022    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 type 2 diabetes mellitus (CMS-HCC) 12/02/2022    Other chronic pain 11/06/2022    Current moderate episode of major depressive disorder (CMS-HCC) 11/06/2022    Other insomnia 11/06/2022    Portal hypertension (CMS-HCC) 10/08/2022    Right upper quadrant abdominal pain 09/22/2022    Other ascites 09/22/2022    Nausea 09/22/2022    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    Hx of psoriasis 08/06/2022    Esophageal varices in cirrhosis (CMS-HCC) 06/17/2022    Hepatic cirrhosis (CMS-HCC) 06/17/2022    Secondary esophageal varices without bleeding (CMS-HCC) 06/17/2022    Hepatic encephalopathy (CMS-HCC) 06/17/2022    Diabetes mellitus (CMS-HCC) 06/17/2021    NPDR (nonproliferative diabetic retinopathy) (CMS-HCC) 06/17/2021    Ptosis of eyelid, left 06/17/2021    Hepatic steatosis 03/13/2021    Coronary artery disease involving native heart without angina pectoris 02/05/2021    H/O diabetic gastroparesis 01/09/2021    Retinopathy of left eye, background, proliferative 01/09/2021    Dry eye syndrome, bilateral 12/21/2020    Angular blepharoconjunctivitis of both eyes 12/21/2020    Cataract associated with type 2 diabetes mellitus (CMS-HCC) 12/21/2020    Type 2 diabetes mellitus, with long-term current use of insulin (CMS-HCC) 12/12/2015    Hyperlipidemia 12/12/2015    Hypertension 05/23/2015    PFD (pelvic floor dysfunction) 04/07/2013    Hemorrhage of rectum and anus 03/17/2011        PAST SURGICAL HISTORY:  Past Surgical History:   Procedure Laterality Date    CHOLECYSTECTOMY      HYSTERECTOMY      endometrial cancer    HYSTERECTOMY      OOPHORECTOMY      PR ANAL PRESSURE RECORD Left 03/31/2013    Procedure: ANORECTAL MANOMETRY;  Briggs: None None;  Location: [facility];  Service: Gastroenterology    PR BREATH HYDROGEN TEST N/A 03/31/2013    Procedure: BREATH HYDROGEN TEST;  Briggs: None None;  Location: GI [facility];  Service: Gastroenterology    PR COLSC FLX W/RMVL OF TUMOR POLYP LESION SNARE TQ N/A 09/06/2020    Procedure: COLONOSCOPY FLEX; W/REMOV TUMOR/LES BY SNARE;  Briggs: Chriss Driver, MD;  Location: [facility];  Service: Gastroenterology    PR UPPER GI ENDOSCOPY,BIOPSY N/A 04/15/2013  Procedure: UGI ENDOSCOPY; WITH BIOPSY, SINGLE OR MULTIPLE;  Briggs: Vickii Chafe, MD;  Location: [facility];  Service: Gastroenterology    PR UPPER GI ENDOSCOPY,BIOPSY N/A 09/06/2020    Procedure: UGI ENDOSCOPY; WITH BIOPSY, SINGLE OR MULTIPLE;  Briggs: Chriss Driver, MD;  Location: [facility];  Service: Gastroenterology    PR UPPER GI ENDOSCOPY,BIOPSY N/A 05/05/2022    Procedure: UGI ENDOSCOPY; WITH BIOPSY, SINGLE OR MULTIPLE;  Briggs: Chriss Driver, MD;  [facility]: [facility];  Service: Gastroenterology    PR UPPER GI ENDOSCOPY,BIOPSY N/A 01/22/2023    Procedure: UGI ENDOSCOPY; WITH BIOPSY, SINGLE OR MULTIPLE;  Briggs: Vonda Antigua, MD;  Location: [facility];  Service: Gastroenterology       ALLERGIES:  No Known Allergies    MEDICATIONS:    Patient's Medications   New Prescriptions    No medications on file   Previous Medications    ACETAMINOPHEN (TYLENOL) 325 MG TABLET    Take 2 tablets (650 mg total) by mouth two (2) times a day as needed for pain.    ASPIRIN (ECOTRIN) 81 MG TABLET    Take 1 tablet (81 mg total) by mouth daily.    BACLOFEN (LIORESAL) 5 MG TAB TABLET    Increase to 10mg  nightly.    BLOOD SUGAR DIAGNOSTIC STRP    Test two (2) times a day (30 minutes before a meal).    BLOOD-GLUCOSE METER,CONTINUOUS (DEXCOM G7 RECEIVER) MISC    Use as instructed with G7 sensors.    BLOOD-GLUCOSE SENSOR (DEXCOM G7 SENSOR) DEVI    Use to monitor blood glucose levels continuously. Change sensor every 10 days.    BUPROPION (WELLBUTRIN XL) 150 MG 24 HR TABLET    Take 1 tablet (150 mg total) by mouth daily.    CANAGLIFLOZIN (INVOKANA) 300 MG TAB TABLET    Take 1 tablet (300 mg total) by mouth daily before breakfast.    CARVEDILOL (COREG) 6.25 MG TABLET    Take 1 tablet (6.25 mg total) by mouth two (2) times a day.    CHLORHEXIDINE (PERIDEX) 0.12 % SOLUTION    15 mL by Mouth route daily.    CHOLESTYRAMINE-ASPARTAME (CHOLESTYRAMINE LIGHT) 4 GRAM PWPK    Mix 1 packet as directed and take by mouth daily.    CLOTRIMAZOLE (MYCELEX) 10 MG TROCHE    Dissolve 1 tablet (10 mg total) in mouth five (5) times a day.    DULAGLUTIDE (TRULICITY) 1.5 MG/0.5 ML PNIJ    Inject 0.5 mL (1.5 mg total) under the skin every seven (7) days.    EMPTY CONTAINER MISC    Use as directed to dispose of Cosentyx pens.    FERROUS SULFATE (SLOW FE) 142 MG (45 MG IRON) TBER    Take 1 tablet (142 mg) by mouth two (2) times a day.    FUROSEMIDE (LASIX) 40 MG TABLET    Take 1 tablet (40 mg total) by mouth two (2) times a day.    GABAPENTIN (NEURONTIN) 300 MG CAPSULE    Take 1 capsule (300 mg total) by mouth Three (3) times a day.    INSULIN LISPRO (HUMALOG) 100 UNIT/ML INJECTION PEN    Inject 10 Units under the skin Three (3) times a day before meals.    LACTULOSE 10 GRAM/15 ML SOLUTION    Take 30 mL (20 g total) by mouth Three (3) times a day.    LANCETS (ACCU-CHEK SOFTCLIX LANCETS) MISC  Use as directed to test once daily.    LIDOCAINE (ASPERCREME) 4 % PATCH    Place 1 patch on the skin daily. Apply for 12 hours then remove for 12 hours.    MAGNESIUM OXIDE (MAG-OX) 400 MG (241.3 MG ELEMENTAL MAGNESIUM) TABLET    Take 2 tablets (800 mg total) by mouth two (2) times a day.    METFORMIN (GLUCOPHAGE-XR) 500 MG 24 HR TABLET    TAKE 2 TABLETS BY MOUTH IN THE MORNING AND 2 IN THE EVENING WITH MEALS    MIRTAZAPINE (REMERON) 7.5 MG TABLET    Take 1 tablet (7.5 mg total) by mouth.    ONDANSETRON (ZOFRAN-ODT) 4 MG DISINTEGRATING TABLET    Dissolve 1 tablet (4 mg total) in the mouth every twelve (12) hours as needed for nausea.    PANTOPRAZOLE (PROTONIX) 40 MG TABLET    Take 1 tablet (40 mg total) by mouth two (2) times a day.    PEN NEEDLE, DIABETIC (ULTICARE PEN NEEDLE) 32 GAUGE X 1/4 (6 MM) NDLE    Inject 1 each under the skin nightly. PROMETHAZINE (PHENERGAN) 12.5 MG TABLET    Take 1 tablet (12.5 mg total) by mouth every eight (8) hours as needed for nausea.    RIFAXIMIN (XIFAXAN) 550 MG TAB    Take 1 tablet (550 mg total) by mouth two (2) times a day.    SECUKINUMAB (COSENTYX PEN) 150 MG/ML PNIJ INJECTION    Inject the contents of 2 pens (300 mg total) under the skin once a week at weeks 0, 1, 2, 3, and 4. THEN inject the contents of 2 pens (300 mg total) every 4 weeks thereafter.    SECUKINUMAB (COSENTYX PEN, 2 PENS,) 150 MG/ML PNIJ INJECTION    Inject the contents of 2 pens (300 mg total) under the skin every twenty-eight (28) days. Maintenance dose    SEMGLEE,INSULIN GLARG-YFGN,PEN 100 UNIT/ML (3 ML) INPN    INJECT 50 UNITS UNDER THE SKIN NIGHTLY AS DIRECTED BY YOUR DOCTOR AROUND SAME TIME EVERY DAY. TOTAL DAILY DOSE NOT TO EXCEED 50 UNITS    SIMVASTATIN (ZOCOR) 20 MG TABLET    Take 1 tablet (20 mg total) by mouth every evening.    SPIRONOLACTONE (ALDACTONE) 100 MG TABLET    Take 1 tablet (100 mg total) by mouth two (2) times a day.    ZOLPIDEM (AMBIEN) 5 MG TABLET    Take 1 tablet (5 mg total) by mouth nightly as needed.   Modified Medications    No medications on file   Discontinued Medications    No medications on file       SOCIAL HISTORY:    Social History     Socioeconomic History    Marital status: Widowed     Spouse name: None    Number of children: None    Years of education: None    Highest education level: None   Tobacco Use    Smoking status: Never     Passive exposure: Current    Smokeless tobacco: Never   Vaping Use    Vaping status: Never Used   Substance and Sexual Activity    Alcohol use: Not Currently     Comment: No longer    Drug use: Not Currently    Sexual activity: Not Currently     Partners: Male     Birth control/protection: Abstinence   Other Topics Concern    Do you use sunscreen? Yes    Tanning bed use?  No    Are you easily burned? Yes    Excessive sun exposure? No    Blistering sunburns? Yes     Comment: When I was in my teens and twenties   Social History Narrative    Widowed    No children    Not currently working:  Previously worked as a Engineer, site     Social Drivers of Psychologist, prison and probation services Strain: Low Risk  (08/18/2023)    Overall Financial Resource Strain (CARDIA)     Difficulty of Paying Living Expenses: Not very hard   Food Insecurity: No Food Insecurity (08/18/2023)    Hunger Vital Sign     Worried About Running Out of Food in the Last Year: Never true     Ran Out of Food in the Last Year: Never true   Transportation Needs: No Transportation Needs (08/18/2023)    PRAPARE - Therapist, art (Medical): No     Lack of Transportation (Non-Medical): No   Physical Activity: Insufficiently Active (01/09/2021)    Exercise Vital Sign     Days of Exercise per Week: 1 day     Minutes of Exercise per Session: 40 min   Stress: Stress Concern Present (09/17/2022)    [employer] of Occupational Health - Occupational Stress Questionnaire     Feeling of Stress : Very much   Social Connections: Moderately Isolated (09/02/2018)    Received from [facility]    Social Connection and Isolation Panel [NHANES]     Frequency of Communication with Friends and Family: Once a week     Frequency of Social Gatherings with Friends and Family: Twice a week     Attends Religious Services: Never     Database administrator or Organizations: No     Attends Banker Meetings: Never     Marital Status: Widowed       FAMILY HISTORY:    Family History   Problem Relation Age of Onset    Diabetes Mother     Cancer Mother     No Known Problems Father     No Known Problems Brother     Diabetes Maternal Grandmother     Hypertension Maternal Grandmother     Cancer Maternal Grandfather     Diabetes Maternal Grandfather     No Known Problems Paternal Grandmother     Skin cancer Paternal Grandfather     Stroke Paternal Grandfather     No Known Problems Maternal Aunt     No Known Problems Maternal Uncle     No Known Problems Paternal Aunt     No Known Problems Paternal Uncle     No Known Problems Other     Melanoma Neg Hx     Basal cell carcinoma Neg Hx     Squamous cell carcinoma Neg Hx     Substance Abuse Disorder Neg Hx     Alcohol abuse Neg Hx     Drug abuse Neg Hx     Mental illness Neg Hx     Amblyopia Neg Hx     Blindness Neg Hx     Cataracts Neg Hx     Glaucoma Neg Hx     Macular degeneration Neg Hx     Retinal detachment Neg Hx     Strabismus Neg Hx     Thyroid disease Neg Hx     Breast cancer Neg Hx  REVIEW OF SYSTEMS:    Pertinent positive and negatives are documented as per the HPI; all other systems reviewed are negative.    VITAL SIGNS:  BP 100/60 (BP Position: Sitting)  - Pulse 83  - Temp 36.6 ??C (97.8 ??F)  - Ht 162.6 cm (5' 4.02)  - Wt 96.2 kg (212 lb)  - LMP 08/03/2006  - BMI 36.37 kg/m??     PHYSICAL EXAM:  CONSTITUTIONAL:  Well appearing, well developed, well-nourished - in no acute distress.  Ambulates with cane assistance.  EYES:  PERRL.  Conjunctivae clear, anicteric.  No lid lesions.  ENT:  adequate dentition; oral mucosal membranes moist without lesions, erythema, or exudates. Nares without discharge.  No thyromegaly.  CARDIOVASULAR: Regular rate and rhythm.  Normal S1 and S2.  No murmurs/rubs/gallops.  RESPIRATORY:  Normal / unlabored respiratory effort. CTA bilaterally without wheezes or rhonchi.  GASTROINTESTINAL:  Bowel sounds present x 4 quadrants.  Abdomen is soft, nontender, non-distended; no masses and no hepatosplenomegaly appreciated.  Rectal exam deferred.  GENITOURINARY:  Deferred  MUSCULOSKELETAL:  Normal ROM throughout.  No joint swelling or tenderness noted.  No deformities.  Ambulates with cane assistance.  SKIN:  Warm, dry, well-perfused.  No edema, clubbing or cyanosis. Skin appears mildly jaundice.  NEUROLOGIC: Alert and oriented x 3.  Normal gait and station.  No focal deficits.  PSYCHIATRIC:  Estate agent. Thought, judgment and insight adequate. Mood and affect congruent.    HEME / LYMPH / IMMUNOLOGIC:  No cervical, submandibular, or supraclavicular adenopathy.

## 2023-10-30 MED ORDER — SEMGLEE (INSULIN GLARGINE-YFGN) PEN 100 UNIT/ML (3 ML) SUBCUTANEOUS
0 refills | 0 days
Start: 2023-10-30 — End: ?

## 2023-11-02 ENCOUNTER — Other Ambulatory Visit
Admission: RE | Admit: 2023-11-02 | Discharge: 2023-11-02 | Disposition: A | Discharge status: Home / Self Care | Payer: Medicaid Other | Source: Ambulatory Visit | Attending: Pain Medicine | Admitting: Pain Medicine

## 2023-11-02 ENCOUNTER — Ambulatory Visit
Admission: RE | Admit: 2023-11-02 | Discharge: 2023-11-02 | Disposition: A | Discharge status: Home / Self Care | Payer: Medicaid Other | Source: Ambulatory Visit | Attending: Pain Medicine | Admitting: Pain Medicine

## 2023-11-02 ENCOUNTER — Encounter: Payer: Self-pay | Admitting: Pain Medicine

## 2023-11-02 ENCOUNTER — Ambulatory Visit: Payer: Medicaid Other | Admitting: Pain Medicine

## 2023-11-02 ENCOUNTER — Ambulatory Visit
Admit: 2023-11-02 | Discharge: 2023-11-03 | Payer: PRIVATE HEALTH INSURANCE | Attending: Nurse Practitioner | Primary: Nurse Practitioner

## 2023-11-02 VITALS — BP 109/61 | HR 80 | Temp 97.3°F | Resp 18 | Ht 64.0 in | Wt 212.0 lb

## 2023-11-02 DIAGNOSIS — R1310 Dysphagia, unspecified: Principal | ICD-10-CM

## 2023-11-02 DIAGNOSIS — M25562 Pain in left knee: Secondary | ICD-10-CM | POA: Insufficient documentation

## 2023-11-02 DIAGNOSIS — M545 Low back pain, unspecified: Secondary | ICD-10-CM

## 2023-11-02 DIAGNOSIS — M170 Bilateral primary osteoarthritis of knee: Secondary | ICD-10-CM | POA: Insufficient documentation

## 2023-11-02 DIAGNOSIS — G894 Chronic pain syndrome: Secondary | ICD-10-CM

## 2023-11-02 DIAGNOSIS — M542 Cervicalgia: Secondary | ICD-10-CM | POA: Insufficient documentation

## 2023-11-02 DIAGNOSIS — M25511 Pain in right shoulder: Secondary | ICD-10-CM | POA: Insufficient documentation

## 2023-11-02 DIAGNOSIS — R519 Headache, unspecified: Secondary | ICD-10-CM | POA: Insufficient documentation

## 2023-11-02 DIAGNOSIS — L409 Psoriasis, unspecified: Secondary | ICD-10-CM | POA: Insufficient documentation

## 2023-11-02 DIAGNOSIS — M25561 Pain in right knee: Secondary | ICD-10-CM | POA: Insufficient documentation

## 2023-11-02 DIAGNOSIS — Z79899 Other long term (current) drug therapy: Secondary | ICD-10-CM

## 2023-11-02 DIAGNOSIS — G8929 Other chronic pain: Secondary | ICD-10-CM | POA: Diagnosis present

## 2023-11-02 DIAGNOSIS — Z789 Other specified health status: Secondary | ICD-10-CM

## 2023-11-02 DIAGNOSIS — R109 Unspecified abdominal pain: Secondary | ICD-10-CM | POA: Insufficient documentation

## 2023-11-02 DIAGNOSIS — M899 Disorder of bone, unspecified: Secondary | ICD-10-CM

## 2023-11-02 HISTORY — DX: Other long term (current) drug therapy: Z79.899

## 2023-11-02 LAB — COMPREHENSIVE METABOLIC PANEL
ALT: 26 U/L (ref 0–44)
AST: 47 U/L — ABNORMAL HIGH (ref 15–41)
Albumin: 2.7 g/dL — ABNORMAL LOW (ref 3.5–5.0)
Alkaline Phosphatase: 257 U/L — ABNORMAL HIGH (ref 38–126)
Anion gap: 10 (ref 5–15)
BUN: 19 mg/dL (ref 6–20)
CO2: 21 mmol/L — ABNORMAL LOW (ref 22–32)
Calcium: 9.4 mg/dL (ref 8.9–10.3)
Chloride: 96 mmol/L — ABNORMAL LOW (ref 98–111)
Creatinine, Ser: 0.88 mg/dL (ref 0.44–1.00)
GFR, Estimated: >60 mL/min (ref >60)
Glucose, Bld: 303 mg/dL — ABNORMAL HIGH (ref 70–99)
Potassium: 5.2 mmol/L — ABNORMAL HIGH (ref 3.5–5.1)
Sodium: 127 mmol/L — ABNORMAL LOW (ref 135–145)
Total Bilirubin: 7.5 mg/dL — ABNORMAL HIGH (ref <1.2)
Total Protein: 8.8 g/dL — ABNORMAL HIGH (ref 6.5–8.1)

## 2023-11-02 LAB — SEDIMENTATION RATE: Sed Rate: 65 mm/h — ABNORMAL HIGH (ref 0–30)

## 2023-11-02 LAB — MAGNESIUM: Magnesium: 2 mg/dL (ref 1.7–2.4)

## 2023-11-02 NOTE — Unmapped (Addendum)
It was good to meet you today. I wanted to ensure that you had a summary of my recommendations and ensure you had my contact information to reach out as needed:     Schedule for modified barium swallow to assess the swallowing mechanism and to rule out penetration and aspiration.  This will be done with the assistance of speech-language pathology.  You can call [phone] to set up this appointment.    Schedule for esophageal manometry to assess the motility of the esophagus.  You can call [phone] to set up this appointment.    Continue pantoprazole 40 mg daily.    Please try the following Ups and Downs of Swallowing     1.  Sit UP when eating: Sitting up will allow the esophagus to gain the most benefit out of one of nature???s most basic principle, gravity. Paying attention to posture will also help straight the esophagus.   2.  Slow DOWN, do not rush when eating  3.  Chew UP your food very carefully   4.  Drink DOWN a full glass of water with your pills   5.  Cut UP your food into very small pieces   6.  Cut DOWN on tough foods to chew (meats)     Return to the GI clinic for routine follow-up in about 3 months.  Please reach out in the interim with questions and/or concerns.    --------------    It is important that you remain active in your healthcare.  I've outlined recommendations from today's clinic visit. If further work-up, tests or consultations are recommended, you should be contacted to get these scheduled.  If for any reason, you do NOT hear from someone within 7 days to get these scheduled, you should reach out to me via [facility] MyChart or at one of the numbers below and together, we'll ensure you get the appropriate appointments.   Again, you need to take responsibility for your healthcare by ensuring appropriate follow-up.  I am here to assist you in getting the healthcare that you need and deserve.    Notes and/or results from recommended tests or consultation are to be sent to me, as the ordering provider.  After I receive these results, I will follow-up with you as appropriate by [facility] MyChart, phone, letter or at your scheduled follow-up clinic appointment.    If for whatever reason you do not hear from me within 7-10 days after you've completed any tests/work-up, but do require further recommendations and/or instructions based on the results prior to a scheduled follow-up clinic visit, please contact me.    MyChart messages and advice requests are now going to be sent to our clinical care team. A team member will respond to the message or send it to your provider if needed.   The message will typically be addressed within 3-4 business days if it is an issue that can be handled by MyChart.   For multiple questions, complex concerns, and/or if you have not been by seen by your provider in the recommended follow-up period you may be asked to schedule a clinic visit.   MyChart messages are NOT read after hours or on weekends. MyChart is NOT meant for urgent issues or emergencies. For emergencies call 911 or go to the nearest emergency department.      Please don't hesitate to reach out to me if you have questions or concerns in the interim.      Important phone numbers:  Nurse Coordinator line:  [phone] Alyse Low, RN)    GI Clinic Appointments AND GI Procedure Appointments:  [phone]    Radiology: [phone], option 3 or 4  If you are being scheduled for any type of radiology, you will need to call to receive your appointment time.  Please call this number for information.     Speech Language Pathology (Modified Barium Swallows):  [phone]    For emergencies after normal business hours or on weekends/holidays   Proceed to the nearest emergency room OR contact the [facility] Operator at [phone] who can page the Gastroenterology Fellow on call.    Forms, Letters, Medical Records: [phone]   [email or web address]

## 2023-11-02 NOTE — Unmapped (Signed)
Endocrinology Follow-up    Ashley Briggs is a 57 y.o. female seen in Endocrinology consultation follow-up on 11/06/23 for Follow-up     Patient is returning for a type 2 diabetes follow up.     Last seen by me on 09/04/23. She has been in the ED two times since our last follow up due to abdominal pain and dental infection. Her blood sugar was elevated on both occasions (454 and 407). No DKA. Believed to have pancreatitis, following with GI.     Patient is taking Lantus 55 units nightly, Humalog 10 units three times daily, metformin 1000 mg twice daily, and Invokana 300 mg daily. She stopped taking Trulicity because of possible pancreatitis.     POCT glucose is 203. Uses a glucometer to monitor her blood sugars. Receiver to Dexcom CGM is broken and she is waiting for a replacement.     Her sugars have been very high due to dental infection but she has noticed that they are slowly coming down.     Eats 2-3 meals daily. When she does not eat a meal, she does not take Humalog leading to her sugars becoming elevated.     ROS: No CP, SOB, DOE, swelling, paresthesias, recent vision changes, polyuria, or polydipsia, or skin/feet problems.    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 Patient has been in the ED for possible pancreatitis and a dental infection leading to higher sugars. Plan to increase long acting insulin to address hyperglycemia.     14-day glucometer data downloaded and interpreted: 10/24/23-11/06/23    Average checks: 2.5 checks per day   Average blood sugar: 244  % time in range: 23  % time high:77  % time low: 0  Sugars are best at noon.     Plan:  -Increase Lantus to 60 units daily.   -Decrease metformin to 500 mg twice daily.   -Continue Invokana 300 mg daily and Humalog 10 units three times daily before meals.   -Start wearing Dexcom sensor. Advised patient to contact the company for further guidance.   -Monitor dietary habits (less carbs and fats).   -Follow up in six weeks.      Lab Results   Component Value Date    A1C 10.3 (H) 08/16/2023    A1C 8.4 (H) 02/11/2023    A1C 7.8 (A) 11/04/2022    GLU 186 (H) 10/26/2023    GLU 505 (HH) 10/16/2023    GLU 448 (H) 10/12/2023    CREATININE 0.78 10/26/2023    CREATININE 0.87 10/16/2023    CREATININE 0.78 10/12/2023    ALBCRERAT  09/04/2023      Comment:      Unable to calculate.    ALBCRERAT 7.7 02/11/2023    ALBCRERAT  02/11/2022      Comment:      Unable to calculate.        Orders:  -     POCT Glucose  -     insulin glargine; Inject 0.6 mL (60 Units total) under the skin nightly.    Hypertension, unspecified type  Overview:  Last Assessment & Plan:   The current medical regimen is effective;  continue present plan and medications.  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 Patient has been in the ED for possible pancreatitis and a dental infection leading to higher sugars. Plan to increase long acting insulin to address hyperglycemia.     14-day glucometer data downloaded and interpreted: 10/24/23-11/06/23    Average checks: 2.5 checks per day   Average blood sugar: 244  % time in range: 23  % time high:77  % time low: 0  Sugars are best at noon.     Plan:  -Increase Lantus to 60 units daily.   -Decrease metformin to 500 mg twice daily.   -Continue Invokana 300 mg daily and Humalog 10 units three times daily before meals. -Start wearing Dexcom sensor. Advised patient to contact the company for further guidance.   -Monitor dietary habits (less carbs and fats).   -Follow up in six weeks.      Lab Results   Component Value Date    A1C 10.3 (H) 08/16/2023    A1C 8.4 (H) 02/11/2023    A1C 7.8 (A) 11/04/2022    GLU 186 (H) 10/26/2023    GLU 505 (HH) 10/16/2023    GLU 448 (H) 10/12/2023    CREATININE 0.78 10/26/2023    CREATININE 0.87 10/16/2023    CREATININE 0.78 10/12/2023    ALBCRERAT  09/04/2023      Comment:      Unable to calculate.    ALBCRERAT 7.7 02/11/2023    ALBCRERAT  02/11/2022      Comment:      Unable to calculate.        Orders:  -     metFORMIN; Take 1 tablet (500 mg total) by mouth Two (2) times a day (30 minutes before a meal).        Return in about 6 weeks (around 12/18/2023).    Patient Instructions   Increase Lantus (long acting insulin) to 60 units daily.      Decrease metformin to one 500 mg tablet twice daily.    Aim to set up [facility] app on your phone. There are instructions online. If this does not work, Solicitor the company for a Scientific laboratory technician. Contact us if there are any issues.     It was nice to see you! Please follow up in 6 weeks.      Subjective:     History of Present Illness: See problem oriented charting.    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 (CMS-HCC) 12/02/2022  Dry eye syndrome, bilateral 12/21/2020    Dysphagia 02/02/2023    Endometrial cancer (CMS-HCC) 2007    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    Liver disease     Major depression     Metabolic dysfunction-associated steatohepatitis (MASH)     Morbid obesity (CMS-HCC) 11/10/2019    Myopia of both eyes with astigmatism and presbyopia 12/02/2022    Nausea 09/22/2022    Non-alcoholic fatty liver disease     NPDR (nonproliferative diabetic retinopathy) (CMS-HCC)     Right eye    Other ascites 09/22/2022    Other chronic pain 11/06/2022    Other insomnia 11/06/2022    PFD (pelvic floor dysfunction) 04/07/2013    Portal hypertension (CMS-HCC) 10/08/2022    Psoriasis     Ptosis of eyelid, left     Left upper eyelid    Reflux     Retinopathy of left eye, background, proliferative 01/09/2021    Right upper quadrant abdominal pain 09/22/2022    Secondary esophageal varices without bleeding (CMS-HCC) 06/17/2022    Type 2 diabetes mellitus, with long-term current use of insulin (CMS-HCC) 12/12/2015    Last Assessment & Plan:   The current medical regimen is effective;  continue present plan and medications.  Patient will continue good management of diabetes especially with weight loss. Hopefully with further weight loss will run into the problem of being overmedicated and having to cut back on medications     Some confusion on diabetes medications was have not.been refilled for over a year.  Pa Past Surgical History:   Procedure Laterality Date    CHOLECYSTECTOMY      HYSTERECTOMY      endometrial cancer    HYSTERECTOMY      OOPHORECTOMY      PR ANAL PRESSURE RECORD Left 03/31/2013    Procedure: ANORECTAL MANOMETRY;  Surgeon: None None;  [facility];  Service: Gastroenterology    PR BREATH HYDROGEN TEST N/A 03/31/2013    Procedure: BREATH HYDROGEN TEST;  Surgeon: None None;  [facility];  Service: Gastroenterology    PR COLSC FLX W/RMVL OF TUMOR POLYP LESION SNARE TQ N/A 09/06/2020    Procedure: COLONOSCOPY FLEX; W/REMOV TUMOR/LES BY SNARE;  Surgeon: Chriss Driver, MD;  [facility];  Service: Gastroenterology    PR UPPER GI ENDOSCOPY,BIOPSY N/A 04/15/2013    Procedure: UGI ENDOSCOPY; WITH BIOPSY, SINGLE OR MULTIPLE;  Surgeon: Vickii Chafe, MD;  Location: [facility];  Service: Gastroenterology    PR UPPER GI ENDOSCOPY,BIOPSY N/A 09/06/2020    Procedure: UGI ENDOSCOPY; WITH BIOPSY, SINGLE OR MULTIPLE;  Surgeon: Chriss Driver, MD;  [facility];  Service: Gastroenterology    PR UPPER GI ENDOSCOPY,BIOPSY N/A 05/05/2022    Procedure: UGI ENDOSCOPY; WITH BIOPSY,  SINGLE OR MULTIPLE;  Surgeon: Chriss Driver, MD;  [facility];  Service: Gastroenterology    PR UPPER GI ENDOSCOPY,BIOPSY N/A 01/22/2023    Procedure: UGI ENDOSCOPY; WITH BIOPSY, SINGLE OR MULTIPLE;  Surgeon: Vonda Antigua, MD;  [facility];  Service: Gastroenterology       Current Outpatient Medications   Medication Sig Dispense Refill    acetaminophen (TYLENOL) 325 MG tablet Take 2 tablets (650 mg total) by mouth two (2) times a day as needed for pain. 180 tablet 1    aspirin (ECOTRIN) 81 MG tablet Take 1 tablet (81 mg total) by mouth daily. 90 tablet 3    baclofen (LIORESAL) 5 mg Tab tablet Increase to 10mg  nightly. 60 tablet 11    blood sugar diagnostic Strp Test two (2) times a day (30 minutes before a meal). 100 strip 1    blood-glucose meter,continuous (DEXCOM G7 RECEIVER) Misc Use as instructed with G7 sensors. 1 each 1    blood-glucose sensor (DEXCOM G7 SENSOR) Devi Use to monitor blood glucose levels continuously. Change sensor every 10 days. 9 each 3    buPROPion (WELLBUTRIN XL) 150 MG 24 hr tablet Take 1 tablet (150 mg total) by mouth daily.      canagliflozin (INVOKANA) 300 mg Tab tablet Take 1 tablet (300 mg total) by mouth daily before breakfast. 90 tablet 0    carvedilol (COREG) 6.25 MG tablet Take 1 tablet (6.25 mg total) by mouth two (2) times a day. 60 tablet 11    chlorhexidine (PERIDEX) 0.12 % solution 15 mL by Mouth route daily.      cholestyramine-aspartame (CHOLESTYRAMINE LIGHT) 4 gram PwPk Mix 1 packet as directed and take by mouth daily. 60 packet 5    clotrimazole (MYCELEX) 10 mg troche Dissolve 1 tablet (10 mg total) in mouth five (5) times a day. 20 tablet 0    empty container Misc Use as directed to dispose of Cosentyx pens. 1 each 2    ferrous sulfate (SLOW FE) 142 mg (45 mg iron) TbER Take 1 tablet (142 mg) by mouth two (2) times a day. 60 tablet 3    furosemide (LASIX) 40 MG tablet Take 1 tablet (40 mg total) by mouth two (2) times a day. 60 tablet 11    gabapentin (NEURONTIN) 300 MG capsule Take 1 capsule (300 mg total) by mouth Three (3) times a day. 90 capsule 0    insulin glargine (BASAGLAR, LANTUS) 100 unit/mL (3 mL) injection pen Inject 0.6 mL (60 Units total) under the skin nightly. 54 mL 1    insulin lispro (HUMALOG) 100 unit/mL injection pen Inject 10 Units under the skin Three (3) times a day before meals. 15 mL 2    lactulose 10 gram/15 mL solution Take 30 mL (20 g total) by mouth Three (3) times a day. 2700 mL 11    lancets (ACCU-CHEK SOFTCLIX LANCETS) Misc Use as directed to test once daily. 100 each 1    lidocaine (ASPERCREME) 4 % patch Place 1 patch on the skin daily. Apply for 12 hours then remove for 12 hours. 15 patch 0    magnesium oxide (MAG-OX) 400 mg (241.3 mg elemental magnesium) tablet Take 2 tablets (800 mg total) by mouth two (2) times a day. 120 tablet 11    metFORMIN (GLUCOPHAGE-XR) 500 MG 24 hr tablet Take 1 tablet (500 mg total) by mouth Two (2) times a day (30 minutes before a meal). 360 tablet 0  mirtazapine (REMERON) 7.5 MG tablet Take 1 tablet (7.5 mg total) by mouth.      ondansetron (ZOFRAN-ODT) 4 MG disintegrating tablet Dissolve 1 tablet (4 mg total) in the mouth every twelve (12) hours as needed for nausea. 30 tablet 0    pantoprazole (PROTONIX) 40 MG tablet Take 1 tablet (40 mg total) by mouth two (2) times a day. 180 tablet 1    pen needle, diabetic (ULTICARE PEN NEEDLE) 32 gauge x 1/4 (6 mm) Ndle Inject 1 each under the skin nightly. 100 each 1    promethazine (PHENERGAN) 12.5 MG tablet Take 1 tablet (12.5 mg total) by mouth every eight (8) hours as needed for nausea. 60 tablet 1    rifAXIMin (XIFAXAN) 550 mg Tab Take 1 tablet (550 mg total) by mouth two (2) times a day. 60 tablet 11    secukinumab (COSENTYX PEN) 150 mg/mL PnIj injection Inject the contents of 2 pens (300 mg total) under the skin once a week at weeks 0, 1, 2, 3, and 4. THEN inject the contents of 2 pens (300 mg total) every 4 weeks thereafter. 10 mL 0    secukinumab (COSENTYX PEN, 2 PENS,) 150 mg/mL PnIj injection Inject the contents of 2 pens (300 mg total) under the skin every twenty-eight (28) days. Maintenance dose 2 mL 5    simvastatin (ZOCOR) 20 MG tablet Take 1 tablet (20 mg total) by mouth every evening. 90 tablet 0    spironolactone (ALDACTONE) 100 MG tablet Take 1 tablet (100 mg total) by mouth two (2) times a day. 60 tablet 11    zolpidem (AMBIEN) 5 MG tablet Take 1 tablet (5 mg total) by mouth nightly as needed.       No current facility-administered medications for this visit.        No Known Allergies    Family History   Problem Relation Age of Onset    Diabetes Mother     Cancer Mother     No Known Problems Father     No Known Problems Brother     Diabetes Maternal Grandmother     Hypertension Maternal Grandmother     Cancer Maternal Grandfather     Diabetes Maternal Grandfather     No Known Problems Paternal Grandmother     Skin cancer Paternal Grandfather     Stroke Paternal Grandfather     No Known Problems Maternal Aunt     No Known Problems Maternal Uncle     No Known Problems Paternal Aunt     No Known Problems Paternal Uncle     No Known Problems Other     Melanoma Neg Hx     Basal cell carcinoma Neg Hx     Squamous cell carcinoma Neg Hx     Substance Abuse Disorder Neg Hx     Alcohol abuse Neg Hx     Drug abuse Neg Hx     Mental illness Neg Hx     Amblyopia Neg Hx     Blindness Neg Hx     Cataracts Neg Hx     Glaucoma Neg Hx     Macular degeneration Neg Hx     Retinal detachment Neg Hx     Strabismus Neg Hx     Thyroid disease Neg Hx     Breast cancer Neg Hx            Review of Systems -  negative except as noted in HPI.  Objective:     Physical Exam:  BP 100/67 (BP Site: L Arm, BP Position: Sitting, BP Cuff Size: Medium)  - Pulse 83  - Temp 37.1 ??C (98.7 ??F) (Oral)  - Wt 94.3 kg (207 lb 12.8 oz)  - LMP 08/03/2006  - SpO2 98%  - BMI 35.65 kg/m??   General appearance - alert, well appearing, and in no distress.  Eyes - No lid lag or stare, no proptosis, EOM's intact.  Mouth - mucous membranes moist.  Neck - supple, thyroid normal in size without nodules or tenderness.  Lymphatics - no cervical or supraclavicular adenopathy appreciated.  Chest - clear, good excursion.  Heart - normal rate, regular rhythm.  Abdomen - non-distended, soft.   Neurological - no hand tremors. Normal gait.  Extremities - extremities warm. No lower extremity edema.  Skin - warm, dry, no visible rashes.  Psych - Normal mood, appropriate affect.  Muskuloskeletal - No kyphosis or spine tenderness.      Lab Review:  I've reviewed the patient's most recent pertinent labs in the electronic record and provided records.  Lab Results Component Value Date    TSH 4.349 10/26/2023         Lab Results   Component Value Date    TSH 4.349 10/26/2023       Lab Results   Component Value Date    CALCIUM 9.1 10/26/2023    CALCIUM 8.8 10/16/2023    CALCIUM 8.9 10/12/2023    PTH 17.3 (L) 08/16/2023    PHOS 2.8 10/12/2023    ALBUMIN 2.5 (L) 10/26/2023    CREATININE 0.78 10/26/2023       Lab Results   Component Value Date    A1C 10.3 (H) 08/16/2023    GLU 186 (H) 10/26/2023    CREATININE 0.78 10/26/2023    ALBCRERAT  09/04/2023      Comment:      Unable to calculate.    ALBQTUR <0.3 09/04/2023    CHOL 122 09/12/2021    HDL 39 (L) 09/12/2021    NONHDL 83 09/12/2021    LDL 62 09/12/2021    TRIG 161 09/12/2021          Radiology:    Thyroid US:  No results found for this or any previous visit.     Results for orders placed during the hospital encounter of 02/18/23    US Soft Tissue Head And Neck    Narrative  EXAM: US SOFT TISSUE HEAD AND NECK  ACCESSION: [id number]    CLINICAL INDICATION: 57 years old with submandibular swelling  - R22.0 - Submandibular swelling - R22.1 - Submandibular swelling    COMPARISON: None    TECHNIQUE: Ultrasound views of the palpable area in the neck were obtained using gray scale and limited color Doppler imaging.    FINDINGS:  At the palpable area of concern on the right there is normal-appearing submandibular gland and a 0.72 cm lymph node with normal fatty hilum.    At the palpable area of concern on the left there is a normal-appearing submandibular gland and multiple lymph nodes measuring up to 0.9 cm in short axis with normal fatty hila.    Impression  No obvious sonographic abnormality of the submandibular glands.  Bilateral level 2 subcentimeter prominent lymph nodes with normal morphology, which could be reactive.     No results found for this or any previous visit.       DEXA:    No results found for this  or any previous visit.             Other Medical Data :       I've personally reviewed and summarized records in EPIC/Media and via CareEverywhere as well as medication, allergies, past medical, social, and family history.     I attest that I, Mamie Nick, personally documented this note while acting as scribe for Marvia Pickles, MD.      Mamie Nick, Scribe.  11/06/2023     The documentation recorded by the scribe accurately reflects the service I personally performed and the decisions made by me.       Marvia Pickles, MD   Electronically signed on Result date not found. 11:44 AM

## 2023-11-02 NOTE — Patient Instructions (Signed)

## 2023-11-02 NOTE — Progress Notes (Signed)
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 lb (96.2 kg)  Height: 5\' 4"  (1.626 m)   BMI Assessment: Estimated body mass index is 36.39 kg/m as calculated from the following:   Height as of this encounter: 5\' 4"  (1.626 m).   Weight as of this encounter: 212 lb (96.2 kg).  BMI interpretation table: BMI level Category Range association with higher incidence of chronic pain  <18 kg/m2 Underweight   18.5-24.9 kg/m2 Ideal body weight   25-29.9 kg/m2 Overweight Increased incidence by 20%  30-34.9 kg/m2 Obese (Class I) Increased incidence by 68%  35-39.9 kg/m2 Severe obesity (Class II) Increased incidence by 136%  >40 kg/m2 Extreme obesity (Class III) Increased incidence by 254%   Patient's current BMI Ideal Body weight  Body mass index is 36.39 kg/m. Ideal body weight: 54.7 kg (120 lb 9.5 oz) Adjusted ideal body weight: 71.3 kg (157 lb 2.5 oz)   BMI Readings from Last 4 Encounters:  11/02/23 36.39 kg/m  09/02/23 39.48 kg/m  04/15/23 40.03 kg/m   03/30/23 34.67 kg/m   Wt Readings from Last 4 Encounters:  11/02/23 212 lb (96.2 kg)  09/02/23 230  lb (104.3 kg)  04/15/23 233 lb 3.2 oz (105.8 kg)  03/30/23 202 lb (91.6 kg)    Psych/Mental status: Alert, oriented x 3 (person, place, & time)       Eyes: PERLA Respiratory: No evidence of acute respiratory distress  Assessment  Primary Diagnosis & Pertinent Problem List: The primary encounter diagnosis was Chronic abdominal pain (1ry area of Pain). Diagnoses of Chronic intractable headache (2ry area of Pain), Chronic neck pain (2ry area of Pain), Chronic shoulder pain (3ry area of Pain) (Right), Chronic low back pain (4th area of Pain) (Bilateral) w/o sciatica, Chronic knee pain (5th area of Pain) (Bilateral), Osteoarthritis of knees (Bilateral), Chronic pain syndrome, Pharmacologic therapy, Disorder of skeletal system, and Problems influencing health status were also pertinent to this visit.  Visit Diagnosis (New problems to examiner): 1. Chronic abdominal pain (1ry area of Pain)   2. Chronic intractable headache (2ry area of Pain)   3. Chronic neck pain (2ry area of Pain)   4. Chronic shoulder pain (3ry area of Pain) (Right)   5. Chronic low back pain (4th area of Pain) (Bilateral) w/o sciatica   6. Chronic knee pain (5th area of Pain) (Bilateral)   7. Osteoarthritis of knees (Bilateral)   8. Chronic pain syndrome   9. Pharmacologic therapy   10. Disorder of skeletal system   11. Problems influencing health status    Plan of Care (Initial workup plan)  Note: Ms. Canet was reminded that as per protocol, today's visit has been an evaluation only. We have not taken over the patient's controlled substance management.  Problem-specific plan: Assessment and Plan    Abdominal Pain   She has chronic epigastric and right upper quadrant pain due to nonalcoholic cirrhosis with ascites, which has not improved post-paracentesis, and she has experienced recent jaundice. Her surgical  history is notable only for a hysterectomy in 2007. We discussed the potential for nerve blocks or interventional pain management should conservative measures fail. We will order blood work and abdominal x-rays.  Headaches   She suffers from chronic stabbing headaches in the temples and occipital region, often bilateral, with no relief from previous injections and no history of head or neck surgeries. The possibility of nerve blocks or interventional pain management was discussed if conservative measures do not provide relief. We will order blood work and head x-rays.  Low Back Pain   She reports chronic bilateral low back pain without radiation to the legs but with generalized leg weakness. There is no history of back surgeries or recent imaging. Nerve blocks or interventional pain management were discussed as options if conservative measures fail. We will order lumbar spine x-rays.  Right Shoulder Pain   She has chronic right shoulder pain following a fracture, with an MRI showing multiple tears and rotator cuff involvement. An orthopedic consult is scheduled for December 5th at [facility], during which surgical risks and benefits were discussed. She is to follow up with the orthopedic consult on December 5th.  Knee Pain   She experiences chronic bilateral knee pain, with temporary relief from previous steroid injections but no recent imaging or physical therapy. The diagnosis was made by an orthopedic surgeon at [facility]. Further steroid injections or interventional pain management were discussed if conservative measures fail. We will order knee x-rays.  Follow-up   A follow-up appointment is scheduled in three weeks.       Lab Orders         Compliance Drug Analysis, Ur  Comp. Metabolic Panel (12)         Magnesium         Vitamin B12         Sedimentation rate         25-Hydroxy vitamin D Lcms D2+D3         C-reactive protein     Imaging Orders         DG  Cervical Spine With Flex & Extend         DG Lumbar Spine Complete W/Bend         DG Shoulder Right         DG Knee Complete 4 Views Right         DG Knee Complete 4 Views Left     Referral Orders  No referral(s) requested today   Procedure Orders    No procedure(s) ordered today   Pharmacotherapy (current): Medications ordered:  No orders of the defined types were placed in this encounter.  Medications administered during this visit: Sharmila M. Aron had no medications administered during this visit.   Analgesic Pharmacotherapy:  Opioid Analgesics: For patients currently taking or requesting to take opioid analgesics, in accordance with [facility] Guidelines, we will assess their risks and indications for the use of these substances. After completing our evaluation, we may offer recommendations, but we no longer take patients for medication management. The prescribing physician will ultimately decide, based on his/her training and level of comfort whether to adopt any of the recommendations, including whether or not to prescribe such medicines.  Membrane stabilizer: To be determined at a later time  Muscle relaxant: To be determined at a later time  NSAID: To be determined at a later time  Other analgesic(s): To be determined at a later time   Interventional management options: Ms. Werth was informed that there is no guarantee that she would be a candidate for interventional therapies. The decision will be based on the results of diagnostic studies, as well as Ms. Carrender's risk profile.  Procedure(s) under consideration:  Pending results of ordered studies      Interventional Therapies  Risk Factors  Considerations  Medical Comorbidities:  Liver cirrhosis w/ ascites  Portal HTN w/ esophageal varices  T2IDDM w/ compl.  GERD  CAD  HTN     Planned  Pending:      Under consideration:   Pending   Completed:   None at this time   Therapeutic   Palliative (PRN) options:   None established   Completed by other providers:   None reported      Provider-requested follow-up: Return in about 3 weeks (around 11/23/2023) for ( ), Eval-day (M,W), (F2F), 2nd Visit, for review of ordered tests.  Future Appointments  Date Time Provider Department Center  11/23/2023  1:00 PM Delano Metz, [place] [facility] None  12/14/2023  3:00 PM Neysa Hotter, [place] [facility] None    Duration of encounter: 45 minutes.  Total time on encounter, as per AMA guidelines included both the face-to-face and non-face-to-face time personally spent by the physician and/or other qualified health care professional(s) on the day of the encounter (includes time in activities that require the physician or other qualified health care professional and does not include time in activities normally performed by clinical staff). Physician's time may include the following activities when performed: Preparing to see the patient (e.g., pre-charting review of records, searching for previously ordered imaging, lab work, and nerve conduction tests) Review of prior analgesic  pharmacotherapies. Reviewing PMP Interpreting ordered tests (e.g., lab work, imaging, nerve conduction tests) Performing post-procedure evaluations, including interpretation of diagnostic procedures Obtaining and/or reviewing separately obtained history Performing a medically appropriate examination and/or evaluation Counseling and educating the patient/family/caregiver Ordering medications, tests, or procedures Referring and communicating with other health care professionals (when not separately reported) Documenting clinical information in the electronic or other health record Independently interpreting results (not separately reported) and communicating results to the patient/ family/caregiver Care coordination (not separately reported)  Note by: Abigail Done, [place] (AI and TTS technology used. I  apologize for any typographical errors that were not detected and corrected.) Date: 11/02/2023; Time: 3:31 PM

## 2023-11-02 NOTE — Progress Notes (Signed)
Safety precautions to be maintained throughout the outpatient stay will include: orient to surroundings, keep bed in low position, maintain call bell within reach at all times, provide assistance with transfer out of bed and ambulation.  

## 2023-11-03 LAB — C-REACTIVE PROTEIN: CRP: 1.2 mg/dL — ABNORMAL HIGH (ref <1.0)

## 2023-11-03 LAB — VITAMIN B12: Vitamin B-12: 1267 pg/mL — ABNORMAL HIGH (ref 180–914)

## 2023-11-03 MED ORDER — SEMGLEE (INSULIN GLARGINE-YFGN) PEN 100 UNIT/ML (3 ML) SUBCUTANEOUS
0 refills | 0 days | Status: CP
Start: 2023-11-03 — End: ?

## 2023-11-04 ENCOUNTER — Ambulatory Visit: Admit: 2023-11-04 | Discharge: 2023-11-05 | Payer: PRIVATE HEALTH INSURANCE

## 2023-11-05 ENCOUNTER — Ambulatory Visit: Admit: 2023-11-05 | Discharge: 2023-11-06 | Payer: PRIVATE HEALTH INSURANCE

## 2023-11-05 DIAGNOSIS — M79621 Pain in right upper arm: Principal | ICD-10-CM

## 2023-11-05 DIAGNOSIS — M25511 Pain in right shoulder: Principal | ICD-10-CM

## 2023-11-05 NOTE — Unmapped (Signed)
SPORTS MEDICINE NEW VISIT    ASSESSMENT      Ashley Briggs is a 57 y.o. female  with:  1.  Right shoulder stiffness and pain    PLAN:    -- We had a detailed discussion about her right shoulder stiffness and pain following falling.  She has a cirrhosis and pancreatitis.  Her last hemoglobin A1c is 10.  Since her comorbidities, surgical options would not be helpful for her.  We discussed all details with her.  She will continue with the conservative management at this stage. She is agree with plan.  All of the questions of the patient's were answered.    --Imaging findings, further diagnostic and therapeutic options were reviewed with the patient, along with the benefits and drawbacks of each, and the patient expressed understanding.    Prescriptions today: None    Follow-up: As needed    X-rays at next visit:  None      SUBJECTIVE       History of Present Illness: Ashley Briggs is a 57 y.o. female who presents for right shoulder stiffness and pain following falling down in April 2024.  After falling down, she tried conservative management including corticosteroid injection and home guided physical therapy.     She sustained a proximal humerus fracture when walking her dog on 03/18/2023.  She was initially seen and treated at outside hospital.  And then followed up in orthopedics.  Fracture was treated nonoperatively.  She subsequently developed a frozen shoulder and underwent a large-volume injection with Dr. Jacqlyn Krauss 06/05/2023, an MRI was ordered for concern for rotator cuff tear.  She has received corticosteroid injection by Dr. Jacqlyn Krauss in September 30, 2023. No interval injury.       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 (CMS-HCC) 12/02/2022    Dry eye syndrome, bilateral 12/21/2020    Dysphagia 02/02/2023    Endometrial cancer (CMS-HCC) 2007    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    Liver disease     Major depression     Metabolic dysfunction-associated steatohepatitis (MASH)     Morbid obesity (CMS-HCC) 11/10/2019  Myopia of both eyes with astigmatism and presbyopia 12/02/2022    Nausea 09/22/2022    Non-alcoholic fatty liver disease     NPDR (nonproliferative diabetic retinopathy) (CMS-HCC)     Right eye    Other ascites 09/22/2022    Other chronic pain 11/06/2022    Other insomnia 11/06/2022    PFD (pelvic floor dysfunction) 04/07/2013    Portal hypertension (CMS-HCC) 10/08/2022    Psoriasis     Ptosis of eyelid, left     Left upper eyelid    Reflux     Retinopathy of left eye, background, proliferative 01/09/2021    Right upper quadrant abdominal pain 09/22/2022    Secondary esophageal varices without bleeding (CMS-HCC) 06/17/2022    Type 2 diabetes mellitus, with long-term current use of insulin (CMS-HCC) 12/12/2015    Last Assessment & Plan:   The current medical regimen is effective;  continue present plan and medications.  Patient will continue good management of diabetes especially with weight loss. Hopefully with further weight loss will run into the problem of being overmedicated and having to cut back on medications     Some confusion on diabetes medications was have not.been refilled for over a year.  Pa    Surgical History  Past Surgical History:   Procedure Laterality Date    CHOLECYSTECTOMY      HYSTERECTOMY      endometrial cancer    HYSTERECTOMY      OOPHORECTOMY      PR ANAL PRESSURE RECORD Left 03/31/2013    Procedure: ANORECTAL MANOMETRY;  Surgeon: None None;  [facility];  Service: Gastroenterology    PR BREATH HYDROGEN TEST N/A 03/31/2013    Procedure: BREATH HYDROGEN TEST;  Surgeon: None None;  [facility];  Service: Gastroenterology    PR COLSC FLX W/RMVL OF TUMOR POLYP LESION SNARE TQ N/A 09/06/2020    Procedure: COLONOSCOPY FLEX; W/REMOV TUMOR/LES BY SNARE;  Surgeon: Chriss Driver, MD;  [facility]: [facility];  Service: Gastroenterology    PR UPPER GI ENDOSCOPY,BIOPSY N/A 04/15/2013    Procedure: UGI ENDOSCOPY; WITH BIOPSY, SINGLE OR MULTIPLE;  Surgeon: Vickii Chafe, MD;  [facility];  Service: Gastroenterology    PR UPPER GI ENDOSCOPY,BIOPSY N/A 09/06/2020    Procedure: UGI ENDOSCOPY; WITH BIOPSY, SINGLE OR MULTIPLE;  Surgeon: Chriss Driver, MD;  [facility];  Service: Gastroenterology    PR UPPER GI ENDOSCOPY,BIOPSY N/A 05/05/2022    Procedure: UGI ENDOSCOPY; WITH BIOPSY, SINGLE OR MULTIPLE;  Surgeon: Chriss Driver, MD;  [facility];  Service: Gastroenterology    PR UPPER GI ENDOSCOPY,BIOPSY N/A 01/22/2023    Procedure: UGI ENDOSCOPY; WITH BIOPSY, SINGLE OR MULTIPLE;  Surgeon: Vonda Antigua, MD;  [facility]: [facility]; Service: Gastroenterology    Medications  has a current medication list which includes the following prescription(s): acetaminophen, aspirin, baclofen, blood sugar diagnostic, dexcom g7 receiver, dexcom g7 sensor, bupropion, canagliflozin, carvedilol, chlorhexidine, cholestyramine-aspartame, clotrimazole, trulicity, empty container, slow fe, furosemide, gabapentin, insulin lispro, lactulose, lancets, lidocaine, magnesium oxide, metformin, mirtazapine, ondansetron, pantoprazole, pen needle, diabetic, promethazine, rifaximin, cosentyx pen, cosentyx pen (2 pens), semglee(insulin glarg-yfgn)pen, simvastatin, spironolactone, and zolpidem.   Tobacco/Alcohol History  Social History     Tobacco Use   Smoking Status Never    Passive exposure: Current   Smokeless Tobacco Never     Social History     Substance and Sexual Activity  Alcohol Use Not Currently    Comment: No longer    Social History        Occupational History    Not on file       Home Address:  [address]  [place] [address] Family History  Family History   Problem Relation Age of Onset    Diabetes Mother     Cancer Mother     No Known Problems Father     No Known Problems Brother     Diabetes Maternal Grandmother     Hypertension Maternal Grandmother     Cancer Maternal Grandfather     Diabetes Maternal Grandfather     No Known Problems Paternal Grandmother     Skin cancer Paternal Grandfather     Stroke Paternal Grandfather     No Known Problems Maternal Aunt     No Known Problems Maternal Uncle     No Known Problems Paternal Aunt     No Known Problems Paternal Uncle     No Known Problems Other     Melanoma Neg Hx     Basal cell carcinoma Neg Hx     Squamous cell carcinoma Neg Hx     Substance Abuse Disorder Neg Hx     Alcohol abuse Neg Hx     Drug abuse Neg Hx     Mental illness Neg Hx     Amblyopia Neg Hx     Blindness Neg Hx     Cataracts Neg Hx     Glaucoma Neg Hx     Macular degeneration Neg Hx     Retinal detachment Neg Hx Strabismus Neg Hx     Thyroid disease Neg Hx     Breast cancer Neg Hx         Allergies   Patient has no known allergies.       Review of Systems  .   Marland Kitchen  No chest pain, dyspnea, nausea, fevers, chills         OBJECTIVE     Physical Exam:    DETAILED PHYSICAL EXAM  General Appearance well-nourished and no acute distress   Vitals Estimated body mass index is 36.37 kg/m?? as calculated from the following:    Height as of this encounter: 162.6 cm (5' 4.02).    Weight as of this encounter: 96.2 kg (212 lb).   Mood and Affect alert, cooperative, and pleasant       Cardiovascular well-perfused distally and no swelling         MUSCULOSKELETAL   RIGHT:      SHOULDER: Range of motion: 100 (actively)-120 passively/30/deferred  Apprehension: Negative  Provocative tests/ tenderness: Positive Neer and Hawkins tests, positive Speed test and positive Jobe test         Imaging/other tests: None today    Orthopaedic PROMIS:  Failed to redirect to the Timeline version of the REVFS SmartLink.        06/17/2022   [facility]   Upper Extremity Physical Function CAT Score 14   Pain Interference CAT Score 34       PRO Status:  Incomplete.       ADMINISTRATIVE     I have personally reviewed and interpreted the images (as available).  I have personally reviewed prior records and incorporated relevant information above (as available).      PATIENT PROFILE     Practice: new to provider  Plan: Continued conservative management  PROCEDURES     Procedures     DME     DME ORDER:  Dx:  ,

## 2023-11-05 NOTE — Unmapped (Signed)
 I saw and evaluated the patient, participating in the key portions of the service.  I reviewed the resident???s note.  I agree with the resident???s findings and plan. Cruz Condon, MD

## 2023-11-06 ENCOUNTER — Ambulatory Visit
Admit: 2023-11-06 | Discharge: 2023-11-07 | Payer: PRIVATE HEALTH INSURANCE | Attending: "Endocrinology | Primary: "Endocrinology

## 2023-11-06 DIAGNOSIS — I10 Essential (primary) hypertension: Principal | ICD-10-CM

## 2023-11-06 DIAGNOSIS — Z794 Long term (current) use of insulin: Principal | ICD-10-CM

## 2023-11-06 DIAGNOSIS — Z8639 Personal history of other endocrine, nutritional and metabolic disease: Principal | ICD-10-CM

## 2023-11-06 DIAGNOSIS — E113212 Type 2 diabetes mellitus with mild nonproliferative diabetic retinopathy with macular edema, left eye: Principal | ICD-10-CM

## 2023-11-06 DIAGNOSIS — E113211 Type 2 diabetes mellitus with mild nonproliferative diabetic retinopathy with macular edema, right eye: Principal | ICD-10-CM

## 2023-11-06 DIAGNOSIS — E1169 Type 2 diabetes mellitus with other specified complication: Principal | ICD-10-CM

## 2023-11-06 DIAGNOSIS — K7460 Unspecified cirrhosis of liver: Principal | ICD-10-CM

## 2023-11-06 LAB — COMPLIANCE DRUG ANALYSIS, UR

## 2023-11-06 MED ORDER — METFORMIN ER 500 MG TABLET,EXTENDED RELEASE 24 HR
ORAL_TABLET | Freq: Two times a day (BID) | ORAL | 0 refills | 180 days | Status: CP
Start: 2023-11-06 — End: ?

## 2023-11-06 MED ORDER — INSULIN GLARGINE (U-100) 100 UNIT/ML (3 ML) SUBCUTANEOUS PEN
Freq: Every evening | SUBCUTANEOUS | 1 refills | 90 days | Status: CP
Start: 2023-11-06 — End: ?

## 2023-11-06 NOTE — Unmapped (Signed)
Reinstating E Mano order per NP Spacek's plan of care as referral inadvertly linked to other testing and finalized.

## 2023-11-06 NOTE — Unmapped (Addendum)
Increase Lantus (long acting insulin) to 60 units daily.      Decrease metformin to one 500 mg tablet twice daily.    Aim to set up [facility] app on your phone. There are instructions online. If this does not work, Solicitor the company for a Scientific laboratory technician. Contact us if there are any issues.     It was nice to see you! Please follow up in 6 weeks.

## 2023-11-09 ENCOUNTER — Encounter: Payer: Self-pay | Admitting: Pain Medicine

## 2023-11-09 DIAGNOSIS — R892 Abnormal level of other drugs, medicaments and biological substances in specimens from other organs, systems and tissues: Secondary | ICD-10-CM | POA: Insufficient documentation

## 2023-11-09 NOTE — Unmapped (Signed)
Diabetes is not controlled. Patient has been in the ED for possible pancreatitis and a dental infection leading to higher sugars. Plan to increase long acting insulin to address hyperglycemia.     14-day glucometer data downloaded and interpreted: 10/24/23-11/06/23    Average checks: 2.5 checks per day   Average blood sugar: 244  % time in range: 23  % time high:77  % time low: 0  Sugars are best at noon.     Plan:  -Increase Lantus to 60 units daily.   -Decrease metformin to 500 mg twice daily.   -Continue Invokana 300 mg daily and Humalog 10 units three times daily before meals.   -Start wearing Dexcom sensor. Advised patient to contact the company for further guidance.   -Monitor dietary habits (less carbs and fats).   -Follow up in six weeks.      Lab Results   Component Value Date    A1C 10.3 (H) 08/16/2023    A1C 8.4 (H) 02/11/2023    A1C 7.8 (A) 11/04/2022    GLU 186 (H) 10/26/2023    GLU 505 (HH) 10/16/2023    GLU 448 (H) 10/12/2023    CREATININE 0.78 10/26/2023    CREATININE 0.87 10/16/2023    CREATININE 0.78 10/12/2023    ALBCRERAT  09/04/2023      Comment:      Unable to calculate.    ALBCRERAT 7.7 02/11/2023    ALBCRERAT  02/11/2022      Comment:      Unable to calculate.

## 2023-11-12 LAB — 25-HYDROXY VITAMIN D LCMS D2+D3
25-Hydroxy, Vitamin D-2: <1 ng/mL
25-Hydroxy, Vitamin D-3: 4.3 ng/mL
25-Hydroxy, Vitamin D: 4.3 ng/mL — ABNORMAL LOW

## 2023-11-12 MED ORDER — INSULIN LISPRO (U-100) 100 UNIT/ML SUBCUTANEOUS PEN
Freq: Three times a day (TID) | SUBCUTANEOUS | 2 refills | 50.00 days
Start: 2023-11-12 — End: ?

## 2023-11-12 NOTE — Unmapped (Deleted)
Patient is requesting the following refill  Requested Prescriptions      No prescriptions requested or ordered in this encounter       This patients last WCC/CPE date: Not Found     Recent Visits  Date Type Provider Dept   10/26/23 Office Visit Mangel, Benison Pap, DO [place] [facility] [address] [facility]   08/31/23 Office Visit Tessie Eke, MD [place] [facility] [address] [facility]   05/08/23 Office Visit Mangel, Benison Pap, DO [place] [facility] [address] [facility]   02/27/23 Office Visit Mangel, Benison Pap, DO [place] [facility] [address] [facility]   01/01/23 Office Visit Mangel, Benison Pap, DO [place] [facility] [address] [facility]   Showing recent visits within past 365 days and meeting all other requirements  Future Appointments  Date Type Provider Dept   12/07/23 Appointment Rosine Beat, Benison Pap, DO  [facility] [address] [facility]   12/24/23 Appointment Mangel, Benison Pap, DO  [facility] [address] [facility]   Showing future appointments within next 365 days and meeting all other requirements       Last Weights:   Wt Readings from Last 3 Encounters:   11/06/23 94.3 kg (207 lb 12.8 oz)   11/05/23 96.2 kg (212 lb)   11/02/23 96.2 kg (212 lb)

## 2023-11-13 MED ORDER — INSULIN LISPRO (U-100) 100 UNIT/ML SUBCUTANEOUS PEN
Freq: Three times a day (TID) | SUBCUTANEOUS | 2 refills | 50.00 days | Status: CP
Start: 2023-11-13 — End: ?

## 2023-11-14 DIAGNOSIS — Z794 Long term (current) use of insulin: Principal | ICD-10-CM

## 2023-11-14 DIAGNOSIS — E113212 Type 2 diabetes mellitus with mild nonproliferative diabetic retinopathy with macular edema, left eye: Principal | ICD-10-CM

## 2023-11-14 MED ORDER — INSULIN LISPRO (U-100) 100 UNIT/ML SUBCUTANEOUS PEN
0 refills | 0.00 days
Start: 2023-11-14 — End: ?

## 2023-11-17 DIAGNOSIS — L299 Pruritus, unspecified: Principal | ICD-10-CM

## 2023-11-17 MED ORDER — INSULIN LISPRO (U-100) 100 UNIT/ML SUBCUTANEOUS PEN
Freq: Three times a day (TID) | SUBCUTANEOUS | 1 refills | 50.00 days | Status: CP
Start: 2023-11-17 — End: ?

## 2023-11-17 MED ORDER — NALTREXONE 50 MG TABLET
ORAL_TABLET | Freq: Every day | ORAL | 11 refills | 30.00 days | Status: CP
Start: 2023-11-17 — End: ?

## 2023-11-17 NOTE — Unmapped (Unsigned)
Patient cancelled appointment today. list to 1 side while walking.  She denies any overt dizzy or spinning sensations.  She denies any sensory loss in her feet.  She has this checked regularly given her history of undercontrolled diabetes.  On exam, sensation to light touch, pinprick, vibration is intact.  Gait is normal and fluid with full strength throughout.  Romberg is negative.  Reflexes are 1+ throughout.  Tandem walk is abnormal.    DDX:  Possible BPPV but this is less likely given lack of spinning sensation.  Possible early large fiber neuropathy, but this is less likely given lack of sensory findings on exam.    PLAN:  Physical therapy for balance training. New referral placed and printed.       Follow Up:  1 months    Thank you very much for this consultation.    Sincerely,  Viviano Simas NP    Note forwarded to Dr. Lia Foyer for review.     CC: Mangel, Benison Pap, DO         I personally spent 21 minutes face-to-face and non-face-to-face in the care of this patient, which includes all pre, intra, and post visit time on the date of service.  All documented time was specific to the E/M visit and does not include any procedures that may have been performed.     Portions of this record have been created using Scientist, clinical (histocompatibility and immunogenetics). Dictation errors have been sought, but may not have been identified and corrected.      Subjective:   Subjective        Obtained history from the patient      HPI: Patient is a 57 y.o. female seen at the request of Mangel, Benison Pap, DO. PMH notable for DM, HLD, HTN, cataract, retinopathy, CAD, hepatic steatosis, hepatic cirrhosis, secondary esophageal varices, OSA, headache, here today for follow up on headache.     Interval History:    Prior History 10/2023:  Headaches are the same. The pain can ebb and flow. She has tried medication, meditation, no help.   Is using cane for imbalance now.     Prior History 08/2023:  Has not started baclofen yet. [facility] did not have this in stock.   Has appointment with PT, has not started just yet. Was seen in hospital.     Prior History 07/2023:  Still daily. Still having pressure build up, feeling like it's going to explode. Will have occasional very painful spot on head (area varies) that lasts about an hour. Headache is never gone.     Notes some imbalance as well, listing to one side. No dizziness or spinning. No sensory loss in feet per patient.     HA 1 - located whole head, intractable  Pain 8-10/10, described as pressure  Aura: No  Photophobia: No  Phonophobia: No  Osmophobia: Yes, describe: food smells  Nausea/Vomiting: Yes, describe: with occasional vomiting  Vision changes: No  Unilateral tearing, facial flushing, ptosis, weakness, paresthesia: No  Tinnitus: No  Relieving factors No  Aggravating factors: No     Current HA 1 days/month: 30  HA 1 days that are debilitating: 20    Current Medications:   gabapentin    Failed Medications:  Tylenol  Gabapentin  Trigger point injections      Past Medical Hx, Family Hx, Social Hx, and Problem List has been reviewed in the [employer].        REVIEW OF SYSTEMS: pertinent items noted in HPI  Objective:       Physical Exam:  Last menstrual period 08/03/2006, not currently breastfeeding.       Constitutional:       Appearance: Normal appearance. Well-developed and well-groomed.   Eyes:      Pupils: Pupils are equal, round, and reactive to light.   Neurological:      Mental Status: Oriented to person, place, and time.      Deep Tendon Reflexes:      Reflex Scores:       Tricep reflexes are 1+ on the right side and 1+ on the left side.       Bicep reflexes are 1+ on the right side and 1+ on the left side.       Brachioradialis reflexes are 1+ on the right side and 1+ on the left side.       Patellar reflexes are 1+ on the right side and 1+ on the left side.  Psychiatric:         Attention and Perception: Attention normal.         Mood and Affect: Mood and affect normal.         Speech: Speech normal.         Behavior: Behavior is cooperative.         Thought Content: Thought content normal.         Neurologic Exam     Mental Status   Oriented to person, place, and time.   Attention: normal. Concentration: normal.   Speech: speech is normal   Level of consciousness: alert  Knowledge: good.     Cranial Nerves     CN II   Visual fields full to confrontation.     CN III, IV, VI   Pupils are equal, round, and reactive to light.  Extraocular motions are normal.   Right pupil: Consensual response: intact. Accommodation: intact.   Left pupil: Consensual response: intact. Accommodation: intact.   CN III: no CN III palsy  CN VI: no CN VI palsy  Nystagmus: none     CN V   Facial sensation intact.     CN VII   Facial expression full, symmetric.     CN VIII   CN VIII normal.     CN IX, X   CN IX normal.   CN X normal.     CN XI   CN XI normal.     CN XII   CN XII normal.     Motor Exam   Right arm tone: normal  Left arm tone: normal  Right leg tone: normal  Left leg tone: normal    Strength   Right deltoid: 5/5  Left deltoid: 5/5  Right biceps: 5/5  Left biceps: 5/5  Right triceps: 5/5  Left triceps: 5/5  Right wrist flexion: 5/5  Left wrist flexion: 5/5  Right wrist extension: 5/5  Left wrist extension: 5/5  Right interossei: 5/5  Left interossei: 5/5  Right iliopsoas: 5/5  Left iliopsoas: 5/5  Right quadriceps: 5/5  Left quadriceps: 5/5  Right hamstring: 5/5  Left hamstring: 5/5  Right anterior tibial: 5/5  Left anterior tibial: 5/5  Right gastroc: 5/5  Left gastroc: 5/5    Sensory Exam   Right arm light touch: normal  Left arm light touch: normal  Right leg light touch: normal  Left leg light touch: normal  Right arm vibration: normal  Left arm vibration: normal  Right leg vibration: normal  Left leg  vibration: normal  Right arm pinprick: normal  Left arm pinprick: normal  Right leg pinprick: normal  Left leg pinprick: normal    Gait, Coordination, and Reflexes     Gait  Gait: (Cane assisted. Unable to raise from chair unassisted)    Tremor   Resting tremor: absent    Reflexes   Right brachioradialis: 1+  Left brachioradialis: 1+  Right biceps: 1+  Left biceps: 1+  Right triceps: 1+  Left triceps: 1+  Right patellar: 1+  Left patellar: 1+             Diagnostic Studies and Review of Records:     MRI Brain with and without contrast 05/2023:  Impression  No acute intracranial abnormality. Mild cerebral atrophy.  Similar appearance of likely calcified meningioma along the planum sphenoidale.    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  Left: No evidence of temporal arteritis.    LABS:  Component      Latest Ref Rng 08/20/2023   Sodium      135 - 145 mmol/L 130 (L)    Potassium      3.4 - 4.8 mmol/L 4.7    Chloride      98 - 107 mmol/L 100    CO2      20.0 - 31.0 mmol/L 21.0    Anion Gap      5 - 14 mmol/L 9    Bun      9 - 23 mg/dL 13    Creatinine      8.65 - 1.02 mg/dL 7.84    BUN/Creatinine Ratio 19    eGFR CKD-EPI (2021) Female      >=60 mL/min/1.65m2 >90    Glucose      70 - 179 mg/dL 696 (H)    Calcium      8.7 - 10.4 mg/dL 29.5    Albumin      3.4 - 5.0 g/dL 2.2 (L)    Total Protein      5.7 - 8.2 g/dL 7.3    Total Bilirubin      0.3 - 1.2 mg/dL 6.5 (H)    SGOT (AST)      <=34 U/L 51 (H)    ALT      10 - 49 U/L 26    Alkaline Phosphatase      46 - 116 U/L 364 (H)       Legend:  (L) Low  (H) High

## 2023-11-17 NOTE — Unmapped (Signed)
Prescribed naltrexone for itching: 25 mg daily for 2 weeks, increase to 50 mg daily if tolerated. Check liver biochemistries in 1-2 weeks.

## 2023-11-18 NOTE — Telephone Encounter (Signed)
I believe she is on the waitlist for therapy. Could you provide her with an update on this? Thanks!

## 2023-11-19 NOTE — Progress Notes (Signed)
(11/23/2023) NO-SHOW to 2nd encounter to discuss results of ordered tests.  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 The most significant is constant abdominal pain, primarily in the epigastric region and upper quadrants, occasionally radiating to the lower quadrants. The pain is not alleviated by paracentesis, which has been performed twice in the past year. The patient also reports episodes of jaundice, most recently noted a couple of weeks ago. She denies any hospital admissions for confusion or encephalopathy, but has been diagnosed with these conditions.   The patient's second most significant complaint is constant headaches, which began in December of the previous year. The pain is bilateral, affecting the frontal, temporal, and occipital regions. No specific trigger for the onset of these headaches was identified. The patient also reports stiffness and pain in the back of the neck, more pronounced on the right side, and pain and weakness in the right arm, extending from the shoulder to the elbow. She has a history of a fracture in the humerus earlier in the year, with a fragment reportedly migrating into the rotator cuff, causing additional shoulder pain. An MRI confirmed shoulder tears and rotator cuff damage, and a consultation with an orthopedic surgeon is scheduled.   The patient also reports constant pain across the lower back, with no radiation into the legs. She experiences generalized weakness in both legs. Lastly, the patient has bilateral knee pain, which started the previous year. Initial treatment with steroid injections provided temporary relief, but the pain has since persisted. The patient has a scheduled consultation with an orthopedic surgeon for further evaluation."  Review of diagnostic tests ordered on 11/02/2023: Diagnostic lab work shows the  patient to have a vitamin D deficiency of 4.3 ng/mL (normal 30-100).  Comprehensive metabolic panel demonstrated the blood sugar levels to be at 303 with a potassium of 5.2 mmol/L (normal 3.5-5.1); low sodium of 127 mmol/L (1 35-1 45); decreased chloride at 96 mmol/L (normal 98-111) and decreased CO2 levels of 21 mmol/L (22-32).  The patient also presented with elevated total proteins, decrease albumin and increased AST with elevated alkaline phosphatase and total bilirubin.  In addition the lab work demonstrated elevated C-reactive protein and sed rate with elevated vitamin B12 levels.  The urine drug screen test came back positive for a failed alcohol, suspected to be secondary to the fermentation of blood glucose.  Diagnostic x-rays of the cervical spine with flexion and extension views demonstrate minor degenerative disc disease changes at C6-7 with minor multilevel facet hypertrophy.  Diagnostic x-rays of the right knee show tricompartmental osteoarthritis,, moderate in the medial tibiofemoral compartment.  Suspected intra-articular bodies posteriorly in the joint space.  Trace knee joint effusion.  Diagnostic x-rays of the left knee show moderate tricompartmental osteoarthritis, worse in the medial tibiofemoral compartment.  Suspected intra-articular ossified bodies posteriorly in the joint space.  Trace knee joint effusion.  Diagnostic x-rays of the lumbar spine with bending views show a grade 1 anterolisthesis of L4 over L5 without any evidence of instability.  There is moderate L4-5 and L5-S1 facet hypertrophy.  Diagnostic x-rays of the right shoulder show a healed proximal humeral fracture with minimal residual posttraumatic deformity.  Minor acromioclavicular degenerative changes.   Controlled Substance Pharmacotherapy Assessment REMS (Risk Evaluation and Mitigation Strategy)  Opioid Analgesic: None MME/day: 0 mg/day  Monitoring: Last UDS on record: Summary  Date Value Ref Range Status   11/02/2023 FINAL  Final    Comment:    ==================================================================== Compliance Drug Analysis, Ur ==================================================================== Test                             Result       Flag       Units  Drug Present and Declared for Prescription Verification   Gabapentin                     PRESENT      EXPECTED   Baclofen                       PRESENT      EXPECTED   Zolpidem                       PRESENT      EXPECTED   Zolpidem Acid                  PRESENT      EXPECTED    Zolpidem acid is an expected metabolite of zolpidem.    Mirtazapine                    PRESENT      EXPECTED  Drug Present not Declared for Prescription Verification   Alcohol, Ethyl                 0.033        UNEXPECTED g/dL    Sources of ethyl alcohol include alcoholic beverages or as a    fermentation product of glucose; glucose is present in this specimen.    Interpret result with caution, as the presence of ethyl alcohol is    likely due, at least in part, to fermentation of glucose.    Diphenhydramine                PRESENT      UNEXPECTED   Doxylamine                     PRESENT      UNEXPECTED   Hydroxyzine                    PRESENT      UNEXPECTED  Drug Absent but Declared for Prescription Verification   Bupropion                      Not Detected UNEXPECTED   Citalopram                     Not Detected UNEXPECTED   Salicylate                     Not Detected UNEXPECTED    Aspirin, as indicated in the declared medication list, is not always    detected even when used as directed.    Promethazine                   Not Detected UNEXPECTED ==================================================================== Test                      Result    Flag   Units      Ref Range   Creatinine              21  mg/dL      >=16 ==================================================================== Declared Medications:  The  flagging and interpretation on this report are based on the  following declared medications.  Unexpected results may arise from  inaccuracies in the declared medications.   **Note: The testing scope of this panel includes these medications:   Baclofen  Bupropion (Wellbutrin)  Citalopram (Celexa)  Gabapentin  Mirtazapine (Remeron)  Promethazine (Phenergan)   **Note: The testing scope of this panel does not include small to  moderate amounts of these reported medications:   Aspirin  Zolpidem (Ambien)   **Note: The testing scope of this panel does not include the  following reported medications:   Canagliflozin (Invokana)  Carvedilol (Coreg)  Chlorhexidine (Peridex)  Clotrimazole  Dulaglutide (Trulicity)  Furosemide (Lasix)  Insulin (Lantus)  Iron  Lactulose  Magnesium (Mag-Ox)  Metformin  Ondansetron (Zofran)  Pantoprazole (Protonix)  Rifaximin (Xifaxan)  Secukinumab (Cosentyx)  Simvastatin (Zocor)  Spironolactone (Aldactone) ==================================================================== For clinical consultation, please call [phone]. ====================================================================    UDS interpretation: Unexpected findings:  Ethyl alcohol , thought to be secondary to blood glucose fermentation  Risk Assessment Profile: Opioid risk tool (ORT):     11/02/2023    2:10 PM  Opioid Risk   Alcohol 1  Illegal Drugs 2  Rx Drugs 4  Alcohol 0  Illegal Drugs 0  Rx Drugs 0  Age between 16-45 years  0  History of Preadolescent Sexual Abuse 0  Psychological Disease 2  Depression 1  Opioid Risk Tool Scoring 10  Opioid Risk Interpretation High Risk    ORT Scoring interpretation table:  Score <3 = Low Risk for SUD  Score between 4-7 = Moderate Risk for SUD  Score >8 = High Risk for Opioid Abuse   Risk of substance use disorder (SUD): Low  Laboratory Chemistry Profile   Renal Lab Results  Component Value Date   BUN 19  11/02/2023   CREATININE 0.88 11/02/2023   LABCREA 57.3 02/11/2022   BCR 10 11/17/2018   GFRAA 110 11/17/2018   GFRNONAA >60 11/02/2023   SPECGRAV 1.006 03/12/2016   PHUR 7.0 03/12/2016   PROTEINUR NEGATIVE 08/19/2018     Electrolytes Lab Results  Component Value Date   NA 127 (L) 11/02/2023   K 5.2 (H) 11/02/2023   CL 96 (L) 11/02/2023   CALCIUM 9.4 11/02/2023   MG 2.0 11/02/2023     Hepatic Lab Results  Component Value Date   AST 47 (H) 11/02/2023   ALT 26 11/02/2023   ALBUMIN 2.7 (L) 11/02/2023   ALKPHOS 257 (H) 11/02/2023   AMYLASE 60 11/17/2018   LIPASE 137 (H) 11/17/2018   AMMONIA 19 03/31/2023     ID No results found for: "LYMEIGGIGMAB", "HIV", "SARSCOV2NAA", "STAPHAUREUS", "MRSAPCR", "HCVAB", "PREGTESTUR", "RMSFIGG", "QFVRPH1IGG", "QFVRPH2IGG"   Bone Lab Results  Component Value Date   25OHVITD1 4.3 (L) 11/02/2023   25OHVITD2 <1.0 11/02/2023   25OHVITD3 4.3 11/02/2023     Endocrine Lab Results  Component Value Date   GLUCOSE 303 (H) 11/02/2023   GLUCOSEU NEGATIVE 08/19/2018   HGBA1C 7.8 11/04/2022   TSH 1.490 06/01/2018     Neuropathy Lab Results  Component Value Date   VITAMINB12 1,267 (H) 11/02/2023   FOLATE 5.7 03/12/2016   HGBA1C 7.8 11/04/2022     CNS No results found for: "COLORCSF", "APPEARCSF", "RBCCOUNTCSF", "WBCCSF", "POLYSCSF", "LYMPHSCSF", "EOSCSF", "PROTEINCSF", "GLUCCSF", "JCVIRUS", "CSFOLI", "IGGCSF", "LABACHR", "ACETBL"   Inflammation (CRP: Acute  ESR: Chronic) Lab Results  Component Value Date   CRP 1.2 (H)  11/02/2023   ESRSEDRATE 65 (H) 11/02/2023   LATICACIDVEN 1.9 08/20/2018     Rheumatology No results found for: "RF", "ANA", "LABURIC", "URICUR", "LYMEIGGIGMAB", "LYMEABIGMQN", "HLAB27"   Coagulation Lab Results  Component Value Date   PLT 179 03/30/2023   LABHEMA Note: 03/12/2016     Cardiovascular Lab Results  Component Value Date   TROPONINI <0.03 08/19/2018   HGB 9.6 (L) 03/30/2023   HCT 32.1 (L)  03/30/2023     Screening No results found for: "SARSCOV2NAA", "COVIDSOURCE", "STAPHAUREUS", "MRSAPCR", "HCVAB", "HIV", "PREGTESTUR"   Cancer No results found for: "CEA", "CA125", "LABCA2"   Allergens No results found for: "ALMOND", "APPLE", "ASPARAGUS", "AVOCADO", "BANANA", "BARLEY", "BASIL", "BAYLEAF", "GREENBEAN", "LIMABEAN", "WHITEBEAN", "BEEFIGE", "REDBEET", "BLUEBERRY", "BROCCOLI", "CABBAGE", "MELON", "CARROT", "CASEIN", "CASHEWNUT", "CAULIFLOWER", "CELERY"     Note: Lab results reviewed.  Recent Diagnostic Imaging Review  Cervical Imaging: Cervical DG Bending/F/E views: Results for orders placed during the hospital encounter of 11/02/23 DG Cervical Spine With Flex & Extend  Narrative CLINICAL DATA:  Chronic intractable headache. Chronic neck pain. Chronic right shoulder pain. Chronic pain syndrome.  EXAM: CERVICAL SPINE COMPLETE WITH FLEXION AND EXTENSION VIEWS  COMPARISON:  None Available.  FINDINGS: Slight straightening of normal lordosis. No listhesis. No abnormal motion on flexion or extension. Minor disc space narrowing and spurring at C6-C7. Minor multilevel facet hypertrophy. No high-grade bony neural foraminal stenosis. No fracture or compression deformity. No evidence of focal bone lesion by radiograph. No prevertebral soft tissue thickening.  IMPRESSION: Minor degenerative disc disease at C6-C7. Minor multilevel facet hypertrophy.   Electronically Signed By: Narda Rutherford M.D. On: 11/11/2023 10:42  Shoulder Imaging: Shoulder-R DG: Results for orders placed during the hospital encounter of 11/02/23 DG Shoulder Right  Narrative CLINICAL DATA:  Right shoulder pain. Chronic right shoulder pain. Chronic pain syndrome.  EXAM: RIGHT SHOULDER - 2+ VIEW  COMPARISON:  Radiograph 03/18/2023.  FINDINGS: Previous proximal humeral fracture has healed with minimal residual posttraumatic deformity. Fracture fragments are in unchanged alignment from prior  exam. Fracture involves the rotator cuff insertion in the lateral humeral head. Glenohumeral alignment is maintained. Minor acromioclavicular degenerative change. No erosions or acute fracture. No evidence of focal bone lesion by radiograph.  IMPRESSION: 1. Healed proximal humeral fracture with minimal residual posttraumatic deformity. 2. Minor acromioclavicular degenerative change.   Electronically Signed By: Narda Rutherford M.D. On: 11/11/2023 10:43  Lumbosacral Imaging: Lumbar DG Bending views: Results for orders placed during the hospital encounter of 11/02/23 DG Lumbar Spine Complete W/Bend  Narrative CLINICAL DATA:  Low back pain. Chronic bilateral low back pain without sciatica. Chronic pain in both knees. Chronic pain syndrome.  EXAM: LUMBAR SPINE - COMPLETE WITH BENDING VIEWS  COMPARISON:  None Available.  FINDINGS: Five non-rib-bearing lumbar vertebra. Grade 1 anterolisthesis of L4 on L5 of 4 mm. No change in alignment on flexion and extension. No evidence of instability. Normal vertebral body heights. Minor L5-S1 disc space narrowing. Moderate L4-L5 and L5-S1 facet hypertrophy. No evidence of fracture or compression deformity. No visible pars defects, although technically limited assessment due to habitus. No focal bone lesions by radiograph. Sacroiliac joints are congruent.  IMPRESSION: 1. Grade 1 anterolisthesis of L4 on L5 without evidence of instability. 2. Moderate L4-L5 and L5-S1 facet hypertrophy.   Electronically Signed By: Narda Rutherford M.D. On: 11/11/2023 10:39  Knee Imaging: Knee-R DG 4 views: Results for orders placed during the hospital encounter of 11/02/23 DG Knee Complete 4 Views Right  Narrative CLINICAL DATA:  Right knee pain.  Chronic pain in both knees. Primary osteoarthritis of both knees. Chronic pain syndrome.  EXAM: RIGHT KNEE - COMPLETE 4+ VIEW  COMPARISON:  Radiograph 11/12/2021  FINDINGS: Mild to moderate medial  tibiofemoral joint space narrowing. Moderate tricompartmental peripheral spurring. Suspected intra-articular bodies posteriorly in the joint space. Trace knee joint effusion. No fracture or erosion. No evidence of focal bone lesion by radiograph. Peripheral vascular calcifications  IMPRESSION: 1. Tricompartmental osteoarthritis, moderate in the medial tibiofemoral compartment. 2. Suspected intra-articular bodies posteriorly in the joint space. 3. Trace knee joint effusion.   Electronically Signed By: Narda Rutherford M.D. On: 11/11/2023 10:45  Knee-L DG 4 views: Results for orders placed during the hospital encounter of 11/02/23 DG Knee Complete 4 Views Left  Narrative CLINICAL DATA:  Knee pain. Chronic pain of both knees. Primary osteoarthritis of both knees. Chronic pain syndrome.  EXAM: LEFT KNEE - COMPLETE 4+ VIEW  COMPARISON:  10/03/2014  FINDINGS: Moderate medial tibiofemoral joint space narrowing. Moderate tricompartmental peripheral spurring. Suspected intra-articular ossified bodies posteriorly in the joint space. Trace knee joint effusion. No fracture. No erosive change or focal bone abnormality by radiograph. Peripheral vascular calcifications.  IMPRESSION: 1. Moderate tricompartmental osteoarthritis, worst in the medial tibiofemoral compartment. 2. Suspected intra-articular ossified bodies posteriorly in the joint space. 3. Trace knee joint effusion.   Electronically Signed By: Narda Rutherford M.D. On: 11/11/2023 10:46  Complexity Note: Imaging results reviewed.                         Meds   Current Outpatient Medications:    Accu-Chek Softclix Lancets lancets, SMARTSIG:Topical, Disp: , Rfl:    aspirin 81 MG tablet, Take 81 mg by mouth daily., Disp: , Rfl:    Baclofen 5 MG TABS, Take 2 tablets by mouth at bedtime., Disp: , Rfl:    BD PEN NEEDLE NANO 2ND GEN 32G X 4 MM MISC, USE 1 NIGHTLY, Disp: , Rfl:    buPROPion (WELLBUTRIN XL) 150 MG 24 hr  tablet, Take 1 tablet (150 mg total) by mouth daily., Disp: 30 tablet, Rfl: 3   canagliflozin (INVOKANA) 300 MG TABS tablet, Take by mouth., Disp: , Rfl:    carvedilol (COREG) 6.25 MG tablet, Take 6.25 mg by mouth 2 (two) times daily., Disp: , Rfl:    chlorhexidine (PERIDEX) 0.12 % solution, SMARTSIG:0.5 Capful(s) By Mouth Morning-Evening, Disp: , Rfl:    citalopram (CELEXA) 20 MG tablet, Take 20 mg by mouth daily., Disp: , Rfl:    clotrimazole (MYCELEX) 10 MG troche, Take 10 mg by mouth 5 (five) times daily., Disp: , Rfl:    Continuous Glucose Receiver (DEXCOM G7 RECEIVER) DEVI, USE AS INSTRUCTED WITH G7 SENSORS, Disp: , Rfl:    Continuous Glucose Sensor (DEXCOM G7 SENSOR) MISC, USE TO MONITOR BLOOD GLUCOSE LEVELS CONTINUOUSLY. CHANGE SENSOR EVERY 10 DAYS., Disp: , Rfl:    COSENTYX SENSOREADY, 300 MG, 150 MG/ML SOAJ, Inject into the skin., Disp: , Rfl:    Dulaglutide (TRULICITY) 1.5 MG/0.5ML SOPN, Inject 1.5 mg into the skin once a week. Thursdays, Disp: , Rfl:    Ferrous Sulfate (SLOW FE) 142 (45 Fe) MG TBCR, Take by mouth., Disp: , Rfl:    furosemide (LASIX) 40 MG tablet, Take 40 mg by mouth 2 (two) times daily., Disp: , Rfl:    gabapentin (NEURONTIN) 300 MG capsule, Take by mouth once., Disp: , Rfl:    insulin glargine-yfgn (SEMGLEE, YFGN,) 100 UNIT/ML Pen, INJECT 50 UNITS UNDER THE SKIN  NIGHTLY AS DIRECTED BY YOUR DOCTOR AROUND SAME TIME EVERY DAY. TOTAL DAILY DOSE NOT TO EXCEED 50 UNITS, Disp: , Rfl:    insulin lispro (HUMALOG) 100 UNIT/ML KwikPen, Inject into the skin., Disp: , Rfl:    Insulin Pen Needle 32G X 6 MM MISC, 1 Syringe by Does not apply route. Use with Victoza., Disp: , Rfl:    lactulose (CHRONULAC) 10 GM/15ML solution, SMARTSIG:Milliliter(s) By Mouth, Disp: , Rfl:    LANTUS SOLOSTAR 100 UNIT/ML Solostar Pen, Inject 66 Units into the skin daily., Disp: , Rfl:    MAGNESIUM-OXIDE 400 (240 Mg) MG tablet, Take 2 tablets by mouth 2 (two) times daily., Disp: , Rfl:    metFORMIN  (GLUCOPHAGE-XR) 500 MG 24 hr tablet, Take 1,000 mg by mouth 2 (two) times daily., Disp: , Rfl:    mirtazapine (REMERON) 15 MG tablet, Take 1 tablet (15 mg total) by mouth at bedtime., Disp: 30 tablet, Rfl: 0   ondansetron (ZOFRAN-ODT) 4 MG disintegrating tablet, Dissolve 1 tablet (4 mg total) in the mouth every twelve (12) hours as needed for nausea., Disp: , Rfl:    pantoprazole (PROTONIX) 40 MG tablet, TAKE ONE TABLET BY MOUTH EVERY DAY, Disp: 90 tablet, Rfl: 0   PRECISION QID TEST test strip, Test two (2) times a day (30 minutes before a meal)., Disp: , Rfl:    promethazine (PHENERGAN) 12.5 MG tablet, Take by mouth., Disp: , Rfl:    Secukinumab (COSENTYX SENSOREADY PEN) 150 MG/ML SOAJ, Inject the contents of 2 pens (300 mg total) under the skin once a week at weeks 0, 1, 2, 3, and 4. THEN inject the contents of 2 pens (300 mg total) every 4 weeks thereafter., Disp: , Rfl:    Sharps Container (BD SHARPS COLLECTOR) MISC, Use as directed to dispose of Cosentyx pens., Disp: , Rfl:    simvastatin (ZOCOR) 20 MG tablet, TAKE ONE TABLET BY MOUTH EVERY EVENING, Disp: 90 tablet, Rfl: 0   spironolactone (ALDACTONE) 100 MG tablet, Take 100 mg by mouth 2 (two) times daily., Disp: , Rfl:    XIFAXAN 550 MG TABS tablet, Take 550 mg by mouth 2 (two) times daily., Disp: , Rfl:    zolpidem (AMBIEN) 5 MG tablet, Take 1 tablet (5 mg total) by mouth at bedtime as needed for sleep., Disp: 30 tablet, Rfl: 1  Allergies  Ms. Agoglia has no known allergies.  BMI interpretation table: BMI level Category Range association with higher incidence of chronic pain  <18 kg/m2 Underweight   18.5-24.9 kg/m2 Ideal body weight   25-29.9 kg/m2 Overweight Increased incidence by 20%  30-34.9 kg/m2 Obese (Class I) Increased incidence by 68%  35-39.9 kg/m2 Severe obesity (Class II) Increased incidence by 136%  >40 kg/m2 Extreme obesity (Class III) Increased incidence by 254%   Patient's current BMI Ideal Body weight  There is no  height or weight on file to calculate BMI. Patient weight not recorded   BMI Readings from Last 4 Encounters:  11/02/23 36.39 kg/m  09/02/23 39.48 kg/m  04/15/23 40.03 kg/m  03/30/23 34.67 kg/m   Wt Readings from Last 4 Encounters:  11/02/23 212 lb (96.2 kg)  09/02/23 230 lb (104.3 kg)  04/15/23 233 lb 3.2 oz (105.8 kg)  03/30/23 202 lb (91.6 kg)      Interventional Therapies  Risk Factors  Considerations  Medical Comorbidities:  Liver cirrhosis w/ ascites  Portal HTN w/ esophageal varices  T2IDDM w/ compl.  GERD  CAD  HTN  Planned  Pending:      Under consideration:  (HIGH RISK) Diagnostic bilateral celiac plexus block vs. splanchnic nerve block #1 (w/o steroids)    Completed:   None at this time   Therapeutic  Palliative (PRN) options:   None established   Completed by other providers:   None reported      Primary Care Physician: [facility]  Note by: Oswaldo Done, MD (TTS technology used. I apologize for any typographical errors that were not detected and corrected.) Date: 11/23/2023; Time: 8:18 AM

## 2023-11-20 ENCOUNTER — Ambulatory Visit
Admit: 2023-11-20 | Discharge: 2023-11-24 | Disposition: A | Discharge status: Home / Self Care | Payer: PRIVATE HEALTH INSURANCE

## 2023-11-20 ENCOUNTER — Ambulatory Visit: Admit: 2023-11-20 | Discharge: 2023-11-24 | Payer: PRIVATE HEALTH INSURANCE

## 2023-11-20 LAB — CBC W/ AUTO DIFF
BASOPHILS ABSOLUTE COUNT: 0 10*9/L (ref 0.0–0.1)
BASOPHILS RELATIVE PERCENT: 0.3 %
EOSINOPHILS ABSOLUTE COUNT: 0.1 10*9/L (ref 0.0–0.5)
EOSINOPHILS RELATIVE PERCENT: 1.6 %
HEMATOCRIT: 30.8 % — ABNORMAL LOW (ref 34.0–44.0)
HEMOGLOBIN: 9.6 g/dL — ABNORMAL LOW (ref 11.3–14.9)
LYMPHOCYTES ABSOLUTE COUNT: 0.7 10*9/L — ABNORMAL LOW (ref 1.1–3.6)
LYMPHOCYTES RELATIVE PERCENT: 20 %
MEAN CORPUSCULAR HEMOGLOBIN CONC: 31.2 g/dL — ABNORMAL LOW (ref 32.0–36.0)
MEAN CORPUSCULAR HEMOGLOBIN: 23.1 pg — ABNORMAL LOW (ref 25.9–32.4)
MEAN CORPUSCULAR VOLUME: 74.2 fL — ABNORMAL LOW (ref 77.6–95.7)
MEAN PLATELET VOLUME: 8.3 fL (ref 6.8–10.7)
MONOCYTES ABSOLUTE COUNT: 0.4 10*9/L (ref 0.3–0.8)
MONOCYTES RELATIVE PERCENT: 10.7 %
NEUTROPHILS ABSOLUTE COUNT: 2.4 10*9/L (ref 1.8–7.8)
NEUTROPHILS RELATIVE PERCENT: 67.4 %
NUCLEATED RED BLOOD CELLS: 0 /100{WBCs} (ref <=4)
PLATELET COUNT: 154 10*9/L (ref 150–450)
RED BLOOD CELL COUNT: 4.16 10*12/L (ref 3.95–5.13)
RED CELL DISTRIBUTION WIDTH: 19.5 % — ABNORMAL HIGH (ref 12.2–15.2)
WBC ADJUSTED: 3.6 10*9/L (ref 3.6–11.2)

## 2023-11-20 LAB — URINALYSIS WITH MICROSCOPY WITH CULTURE REFLEX PERFORMABLE
BACTERIA: NONE SEEN /HPF
BILIRUBIN UA: NEGATIVE
BLOOD UA: NEGATIVE
GLUCOSE UA: 500 — AB
KETONES UA: NEGATIVE
NITRITE UA: NEGATIVE
PH UA: 5.5 (ref 5.0–9.0)
PROTEIN UA: NEGATIVE
RBC UA: 3 /HPF (ref <=4)
SPECIFIC GRAVITY UA: 1.01 (ref 1.003–1.030)
SQUAMOUS EPITHELIAL: 7 /HPF — ABNORMAL HIGH (ref 0–5)
UROBILINOGEN UA: <2
WBC UA: 31 /HPF — ABNORMAL HIGH (ref 0–5)

## 2023-11-20 LAB — COMPREHENSIVE METABOLIC PANEL
ALBUMIN: 2.6 g/dL — ABNORMAL LOW (ref 3.4–5.0)
ALKALINE PHOSPHATASE: 347 U/L — ABNORMAL HIGH (ref 46–116)
ALT (SGPT): 21 U/L (ref 10–49)
ANION GAP: 14 mmol/L (ref 5–14)
AST (SGOT): 48 U/L — ABNORMAL HIGH (ref <=34)
BILIRUBIN TOTAL: 3.1 mg/dL — ABNORMAL HIGH (ref 0.3–1.2)
BLOOD UREA NITROGEN: 43 mg/dL — ABNORMAL HIGH (ref 9–23)
BUN / CREAT RATIO: 19
CALCIUM: 9.5 mg/dL (ref 8.7–10.4)
CHLORIDE: 103 mmol/L (ref 98–107)
CO2: 13.4 mmol/L — ABNORMAL LOW (ref 20.0–31.0)
CREATININE: 2.26 mg/dL — ABNORMAL HIGH (ref 0.55–1.02)
EGFR CKD-EPI (2021) FEMALE: 24 mL/min/{1.73_m2} — ABNORMAL LOW (ref >=60)
GLUCOSE RANDOM: 156 mg/dL (ref 70–179)
POTASSIUM: 7.3 mmol/L (ref 3.4–4.8)
PROTEIN TOTAL: 8.7 g/dL — ABNORMAL HIGH (ref 5.7–8.2)
SODIUM: 130 mmol/L — ABNORMAL LOW (ref 135–145)

## 2023-11-20 LAB — BASIC METABOLIC PANEL
ANION GAP: 13 mmol/L (ref 5–14)
BLOOD UREA NITROGEN: 42 mg/dL — ABNORMAL HIGH (ref 9–23)
BUN / CREAT RATIO: 21
CALCIUM: 8.3 mg/dL — ABNORMAL LOW (ref 8.7–10.4)
CHLORIDE: 110 mmol/L — ABNORMAL HIGH (ref 98–107)
CO2: 13.7 mmol/L — ABNORMAL LOW (ref 20.0–31.0)
CREATININE: 1.96 mg/dL — ABNORMAL HIGH (ref 0.55–1.02)
EGFR CKD-EPI (2021) FEMALE: 29 mL/min/{1.73_m2} — ABNORMAL LOW (ref >=60)
GLUCOSE RANDOM: 142 mg/dL (ref 70–179)
POTASSIUM: 5.6 mmol/L — ABNORMAL HIGH (ref 3.4–4.8)
SODIUM: 137 mmol/L (ref 135–145)

## 2023-11-20 LAB — HIGH SENSITIVITY TROPONIN I - SERIAL: HIGH SENSITIVITY TROPONIN I: <3 ng/L (ref <=34)

## 2023-11-20 LAB — MAGNESIUM: MAGNESIUM: 2.2 mg/dL (ref 1.6–2.6)

## 2023-11-20 LAB — HIGH SENSITIVITY TROPONIN I - 2 HOUR SERIAL
HIGH SENSITIVITY TROPONIN - DELTA (0-2H): 0 ng/L (ref <=7)
HIGH-SENSITIVITY TROPONIN I - 2 HOUR: <3 ng/L (ref <=34)

## 2023-11-20 LAB — PROTIME-INR
INR: 1.34
PROTIME: 15.3 s — ABNORMAL HIGH (ref 9.9–12.6)

## 2023-11-20 NOTE — Unmapped (Signed)
Patient sent message reporting that she is disoriented, unbalanced, feeling like she might fall, has shaking and dizziness. She checked her blood glucose, it was 107 and her blood pressure was 100 over 54. This has been ongoing for 2 days. Called patient. Blood pressure was checked around 12:30 pm. Advised patient to have someone drive her to the nearest ED. Patient states that she has someone with her who can take her to the [facility] ED right now. Routed to provider.

## 2023-11-20 NOTE — Unmapped (Signed)
Sudden onset while at rest of dizziness, unsteady gait, feeling disoriented X 3 days. History of chronic headaches. Under care of neurologist. Recent medication changes in the last two days by hepatologist.

## 2023-11-20 NOTE — Unmapped (Signed)
 See call with patient.

## 2023-11-21 DIAGNOSIS — E875 Hyperkalemia: Secondary | ICD-10-CM | POA: Insufficient documentation

## 2023-11-21 DIAGNOSIS — R55 Syncope and collapse: Secondary | ICD-10-CM | POA: Insufficient documentation

## 2023-11-21 DIAGNOSIS — N179 Acute kidney failure, unspecified: Secondary | ICD-10-CM | POA: Insufficient documentation

## 2023-11-21 LAB — BASIC METABOLIC PANEL
ANION GAP: 11 mmol/L (ref 5–14)
ANION GAP: 13 mmol/L (ref 5–14)
ANION GAP: 13 mmol/L (ref 5–14)
ANION GAP: 13 mmol/L (ref 5–14)
ANION GAP: 14 mmol/L (ref 5–14)
BLOOD UREA NITROGEN: 33 mg/dL — ABNORMAL HIGH (ref 9–23)
BLOOD UREA NITROGEN: 38 mg/dL — ABNORMAL HIGH (ref 9–23)
BLOOD UREA NITROGEN: 43 mg/dL — ABNORMAL HIGH (ref 9–23)
BLOOD UREA NITROGEN: 43 mg/dL — ABNORMAL HIGH (ref 9–23)
BLOOD UREA NITROGEN: 47 mg/dL — ABNORMAL HIGH (ref 9–23)
BUN / CREAT RATIO: 18
BUN / CREAT RATIO: 20
BUN / CREAT RATIO: 23
BUN / CREAT RATIO: 25
BUN / CREAT RATIO: 26
CALCIUM: 8 mg/dL — ABNORMAL LOW (ref 8.7–10.4)
CALCIUM: 9.1 mg/dL (ref 8.7–10.4)
CALCIUM: 9.1 mg/dL (ref 8.7–10.4)
CALCIUM: 9.3 mg/dL (ref 8.7–10.4)
CALCIUM: 9.7 mg/dL (ref 8.7–10.4)
CHLORIDE: 107 mmol/L (ref 98–107)
CHLORIDE: 107 mmol/L (ref 98–107)
CHLORIDE: 109 mmol/L — ABNORMAL HIGH (ref 98–107)
CHLORIDE: 111 mmol/L — ABNORMAL HIGH (ref 98–107)
CHLORIDE: 113 mmol/L — ABNORMAL HIGH (ref 98–107)
CO2: 11.6 mmol/L — CL (ref 20.0–31.0)
CO2: 15.8 mmol/L — ABNORMAL LOW (ref 20.0–31.0)
CO2: 16.3 mmol/L — ABNORMAL LOW (ref 20.0–31.0)
CO2: 17.8 mmol/L — ABNORMAL LOW (ref 20.0–31.0)
CO2: 18.1 mmol/L — ABNORMAL LOW (ref 20.0–31.0)
CREATININE: 1.47 mg/dL — ABNORMAL HIGH (ref 0.55–1.02)
CREATININE: 1.71 mg/dL — ABNORMAL HIGH (ref 0.55–1.02)
CREATININE: 1.8 mg/dL — ABNORMAL HIGH (ref 0.55–1.02)
CREATININE: 2.04 mg/dL — ABNORMAL HIGH (ref 0.55–1.02)
CREATININE: 2.14 mg/dL — ABNORMAL HIGH (ref 0.55–1.02)
EGFR CKD-EPI (2021) FEMALE: 26 mL/min/{1.73_m2} — ABNORMAL LOW (ref >=60)
EGFR CKD-EPI (2021) FEMALE: 28 mL/min/{1.73_m2} — ABNORMAL LOW (ref >=60)
EGFR CKD-EPI (2021) FEMALE: 32 mL/min/{1.73_m2} — ABNORMAL LOW (ref >=60)
EGFR CKD-EPI (2021) FEMALE: 34 mL/min/{1.73_m2} — ABNORMAL LOW (ref >=60)
EGFR CKD-EPI (2021) FEMALE: 41 mL/min/{1.73_m2} — ABNORMAL LOW (ref >=60)
GLUCOSE RANDOM: 111 mg/dL (ref 70–179)
GLUCOSE RANDOM: 142 mg/dL (ref 70–179)
GLUCOSE RANDOM: 176 mg/dL (ref 70–179)
GLUCOSE RANDOM: 73 mg/dL (ref 70–179)
GLUCOSE RANDOM: 88 mg/dL (ref 70–179)
POTASSIUM: 5 mmol/L — ABNORMAL HIGH (ref 3.4–4.8)
POTASSIUM: 5.7 mmol/L — ABNORMAL HIGH (ref 3.4–4.8)
POTASSIUM: 5.7 mmol/L — ABNORMAL HIGH (ref 3.4–4.8)
POTASSIUM: 5.8 mmol/L — ABNORMAL HIGH (ref 3.4–4.8)
POTASSIUM: 5.9 mmol/L — ABNORMAL HIGH (ref 3.4–4.8)
SODIUM: 136 mmol/L (ref 135–145)
SODIUM: 136 mmol/L (ref 135–145)
SODIUM: 139 mmol/L (ref 135–145)
SODIUM: 140 mmol/L (ref 135–145)
SODIUM: 140 mmol/L (ref 135–145)

## 2023-11-21 LAB — UREA NITROGEN, URINE: UREA NITROGEN URINE: 254 mg/dL

## 2023-11-21 LAB — CREATININE, URINE: CREATININE, URINE: 38 mg/dL

## 2023-11-21 LAB — HIGH SENSITIVITY TROPONIN I - 6 HOUR SERIAL
HIGH SENSITIVITY TROPONIN - DELTA (2-6H): 0 ng/L (ref <=7)
HIGH-SENSITIVITY TROPONIN I - 6 HOUR: <3 ng/L (ref <=34)

## 2023-11-21 LAB — BLOOD GAS, VENOUS
BASE EXCESS VENOUS: -9.8 — ABNORMAL LOW (ref -2.0–2.0)
HCO3 VENOUS: 17 mmol/L — ABNORMAL LOW (ref 22–27)
O2 SATURATION VENOUS: 48 % (ref 40.0–85.0)
PCO2 VENOUS: 33 mmHg — ABNORMAL LOW (ref 40–60)
PH VENOUS: 7.31 — ABNORMAL LOW (ref 7.32–7.43)
PO2 VENOUS: 33 mmHg — ABNORMAL LOW (ref 35–40)

## 2023-11-21 LAB — POTASSIUM, URINE, RANDOM: POTASSIUM URINE: 25.5 mmol/L

## 2023-11-21 LAB — MAGNESIUM: MAGNESIUM: 2 mg/dL (ref 1.6–2.6)

## 2023-11-21 LAB — SODIUM, URINE, RANDOM: SODIUM URINE: 81 mmol/L

## 2023-11-21 LAB — PHOSPHORUS: PHOSPHORUS: 4.7 mg/dL (ref 2.4–5.1)

## 2023-11-21 MED ORDER — INSULIN LISPRO (U-100) 100 UNIT/ML SUBCUTANEOUS PEN
0 refills | 0.00 days
Start: 2023-11-21 — End: ?

## 2023-11-21 MED ADMIN — clotrimazole (MYCELEX) troche 10 mg: 10 mg | ORAL | @ 20:00:00

## 2023-11-21 MED ADMIN — rifAXIMin (XIFAXAN) tablet 550 mg: 550 mg | ORAL | @ 15:00:00 | Stop: 2023-12-01

## 2023-11-21 MED ADMIN — aspirin chewable tablet 81 mg: 81 mg | ORAL | @ 15:00:00

## 2023-11-21 MED ADMIN — baclofen (LIORESAL) tablet 10 mg: 10 mg | ORAL | @ 09:00:00

## 2023-11-21 MED ADMIN — calcium gluconate in sodium chloride (NS) 0.9% 2 gram/100 mL IVPB 2 g: 2 g | INTRAVENOUS | @ 02:00:00 | Stop: 2023-11-20

## 2023-11-21 MED ADMIN — sodium zirconium cyclosilicate (LOKELMA) packet 10 g: 10 g | ORAL | @ 15:00:00 | Stop: 2023-11-22

## 2023-11-21 MED ADMIN — insulin glargine (LANTUS) injection 60 Units: 60 [IU] | SUBCUTANEOUS | @ 09:00:00 | Stop: 2023-11-21

## 2023-11-21 MED ADMIN — pantoprazole (Protonix) EC tablet 40 mg: 40 mg | ORAL | @ 15:00:00

## 2023-11-21 MED ADMIN — sodium chloride 0.9% (NS) bolus 500 mL: 500 mL | INTRAVENOUS | @ 05:00:00 | Stop: 2023-11-21

## 2023-11-21 MED ADMIN — lactulose oral solution: 20 g | ORAL | @ 20:00:00

## 2023-11-21 MED ADMIN — buPROPion (Wellbutrin XL) 24 hr tablet 150 mg: 150 mg | ORAL | @ 15:00:00

## 2023-11-21 MED ADMIN — empagliflozin (JARDIANCE) tablet 25 mg: 25 mg | ORAL | @ 15:00:00

## 2023-11-21 MED ADMIN — sodium chloride 0.9% (NS) bolus 1,000 mL: 1000 mL | INTRAVENOUS | @ 16:00:00 | Stop: 2023-11-21

## 2023-11-21 MED ADMIN — chlorhexidine (PERIDEX) 0.12 % solution 15 mL: 15 mL | OROMUCOSAL | @ 15:00:00

## 2023-11-21 MED ADMIN — sodium zirconium cyclosilicate (LOKELMA) packet 10 g: 10 g | ORAL | @ 02:00:00 | Stop: 2023-11-20

## 2023-11-21 MED ADMIN — furosemide (LASIX) injection 20 mg: 20 mg | INTRAVENOUS | @ 04:00:00 | Stop: 2023-11-20

## 2023-11-21 MED ADMIN — enoxaparin (LOVENOX) syringe 40 mg: 40 mg | SUBCUTANEOUS | @ 11:00:00

## 2023-11-21 MED ADMIN — sodium zirconium cyclosilicate (LOKELMA) packet 10 g: 10 g | ORAL | @ 22:00:00 | Stop: 2023-11-22

## 2023-11-21 MED ADMIN — sodium chloride 0.9% (NS) bolus 500 mL: 500 mL | INTRAVENOUS | @ 02:00:00 | Stop: 2023-11-20

## 2023-11-21 MED ADMIN — clotrimazole (MYCELEX) troche 10 mg: 10 mg | ORAL | @ 15:00:00

## 2023-11-21 MED ADMIN — cholestyramine (QUESTRAN) 4 gram packet 1 packet: 1 | ORAL | @ 11:00:00

## 2023-11-21 MED ADMIN — dextrose 50 % in water (D50W) 50 % solution 25 g: 25 g | INTRAVENOUS | @ 02:00:00 | Stop: 2023-11-20

## 2023-11-21 MED ADMIN — naltrexone (DEPADE) tablet 50 mg: 50 mg | ORAL | @ 15:00:00

## 2023-11-21 MED ADMIN — sodium chloride 0.9% (NS) bolus 500 mL: 500 mL | INTRAVENOUS | @ 03:00:00 | Stop: 2023-11-20

## 2023-11-21 MED ADMIN — mirtazapine (REMERON) tablet 7.5 mg: 7.5 mg | ORAL | @ 09:00:00

## 2023-11-21 MED ADMIN — insulin regular (HumuLIN,NovoLIN) injection 5 Units: 5 [IU] | INTRAVENOUS | @ 02:00:00 | Stop: 2023-11-20

## 2023-11-21 MED ADMIN — albuterol 2.5 mg /3 mL (0.083 %) nebulizer solution 2.5 mg: 2.5 mg | RESPIRATORY_TRACT | @ 01:00:00 | Stop: 2023-11-20

## 2023-11-21 MED ADMIN — gabapentin (NEURONTIN) capsule 300 mg: 300 mg | ORAL | @ 20:00:00

## 2023-11-21 MED ADMIN — magnesium oxide (MAG-OX) tablet 800 mg: 800 mg | ORAL | @ 09:00:00

## 2023-11-21 MED ADMIN — pravastatin (PRAVACHOL) tablet 40 mg: 40 mg | ORAL | @ 09:00:00

## 2023-11-21 MED ADMIN — gabapentin (NEURONTIN) capsule 300 mg: 300 mg | ORAL | @ 15:00:00

## 2023-11-21 MED ADMIN — clotrimazole (MYCELEX) troche 10 mg: 10 mg | ORAL | @ 22:00:00

## 2023-11-21 NOTE — Unmapped (Signed)
Family Medicine Inpatient Service  Progress Note    Team: [facility] (pgr [phone])    Hospital Day: 0    ASSESSMENT / PLAN:   Ashley Briggs is a 57 y.o. female with a past medical history significant for CAD, decompensated NASH cirrhosis, Type 2 diabetes, HLD, psoriasis on Cosentynx, Anxiety and depression who presents with dizziness and found to have AKI and hyperkalemia.     # Hyperkalemia  K on admission was found to be 7.3. EKG demonstrated sinus arrhythmia. K improved with albuterol, insulin, calcium gluconate, and Lokelma. Per nephrology, likely secondary to her new AKI or medication side effect from home spironolactone.   - BMP q8h  - Lokelma 10g q8h   - Telemetry  - Hold spironolactone in setting of hyperkalemia   - Nephro recs as follows:              -- If potassium is greater than 6 obtain EKG              -- If EKG changes are present provide calcium gluconate 2 g              -- If potassium is greater than 6.5 provide calcium gluconate 2 g despite EKG              -- If EKG changes are present or potassium is greater than 6.5 provide shifting agents with insulin (5 units) and dextrose              -- If EKG changes are NOT present and potassium is less than 6.5 DO NOT provide shifting agents              -- If insulin is given please watch glucose q2hr for 6 hours              --Also consider administering lokelma each time potassium is greater than 6    # AKI   Cr elevated on admission (2.26) from baseline of 0.75. initially thought to be prerenal due to acute GI loses in the last week or so 20 Bms a day and diuretic use. However. FeUrea consistent w/ intra-renal cause. Some concern Per nephrology, no acute indications for acute dialysis and may require acute dialysis if medical management does not work.   - Hold Lasix and Spironolactone   - Renal US   - UA and Urine culture   - Strict I/Os  - if 2pm BMP is not improved then consider IV albumin 25% 25-50g BID  - If becoming oliguric or if AKI fails to improve may need to transfer to [place] for GI and nephrology    # MASLD Cirrhosis   # Asterixis  Is being followed by [facility] hepatology outpatient for chronic NASH cirrhosis. In discussions about transplant. Endoscopy in Feb 2024 demonstrated grade 2 esophageal varices and portal hypertensive gastropathy.  No fevers or abdominal tenderness that would suggest SBP. Ascites noted on renal US. MELD-Na score 26. Per pt on lactulose at home, but hasn't taken in a week or more due to diarrhea.   - Continue pantoprazole 40 mg   - Can consider SBP prophylaxis or diagnostic para if new fever or abdominal tenderness  - Hold lasix and spiro as above  - Lactulose TID titrate to 2-3 soft Bms a day.      # Presyncope   Has been having multiple episodes of unsteadiness, dizziness, and pre-syncopal episodes. NO acute weakness or focal symptoms. Neuro exam  normal, so Ct head deferred. Orthostats negative. Likely due to fluid losses or possible underlying arrhythmia.  - Can bolus as needed   - Telemetry     # Metabolic acidosis  Patient has been presenting with several months of diarrhea. VBG on admission demonstrates pH 7.31 with decreased HCO2 (17) and normal anion gap (14). Based on Winter's formula, pCO2 compensation should be 32-26, patient pCO2 33. Likely a NAGMA due to her chronic diarrhea.      # Hypotension  Has had chronic GI losses for the past few months. Blood pressures have been a bit soft while in the ED (90/50s). S/p 1.5 L of fluids.   - Hold Lasix and Coreg in setting of soft BPs  - Can consider additional fluid bolus is continues to be soft in AM      # T2DM  Last A1c was 10.3 on 08/16/2023. Has a GDM at home. Per patient needs to be replaced.   - Hold metformin in setting of metabolic acidosis   - Continue home regimen of insulin: Glargine 60 units nightly and Lispro 10 units TID      # Oral lichen planus  Biopsies from endoscopy were suspicious for lichen planus which is being treated with a mouthwash.      # CAD: Continue aspirin.     # Calcified cerebral meningioma  Known small meningioma. Follows with [facility]surgery and neurology. Has had chronic headaches and dizziness, unclear if related to meningioma. Annual MRIs have demonstrated stable size.      # Plaque psoriasis   - Monthly secukinumab     # Anxiety/ depression   - Continue home mirtazapine 7.5 mg   - Continue home zolpidem 5 mg        # Checklist:  - IVF None  - Daily labs needed: CBC, BMP, and Magnesium  - Diet Regular  - Bowel Regimen: Miralax 17 g daily PRN  - DVT: SQ Lovenox,though will need to closely monitor and switch to heparin if AKI is worsening.   - Code Status:   Orders Placed This Encounter   Procedures    Full Code     Standing Status:   Standing     Number of Occurrences:   1     - Dispo: Floor    [ ]  Anticipated Discharge Location: Home with home health  [ ]  PT/OT/DME: No needs anticipated  [ ]  CM/SW needs: None anticipated  [ ]  Follow up appt: Appointment needed    SUBJECTIVE:  Interval events: Admitted overnight. Subjectively this AM reporting still intermittent dizziness and shakiness. Denies headache, blurry vision, chest pain, shortness of breath, increased swelling, abdominal pain, increased abdominal swelling or any other symptoms.       REVIEW OF SYSTEMS:  Pertinent positives and negatives per HPI. A complete review of systems otherwise negative.    PHYSICAL EXAM:      Intake/Output Summary (Last 24 hours) at 11/21/2023 0658  Last data filed at 11/21/2023 0545  Gross per 24 hour   Intake 1600 ml   Output 450 ml   Net 1150 ml       Recent Vitals:  Vitals:    11/21/23 0233   BP: 99/53   Pulse: 69   Resp: 16   Temp: 36.5 ??C (97.7 ??F)   SpO2: 99%       GEN: non-toxic appearing, lying in bed, NAD   HEENT: NCAT, No scleral icterus. Conjunctiva non-erythematous. MMM.  CV: Regular rate and  rhythm. No murmurs/rubs/gallops.  Pulm: Normal work of breathing on RA. CTAB. No wheezing, crackles, or rhonchi.  Abd:  Nontender. No guarding, rebound.  Normoactive bowel sounds.    Neuro: A&O x 3. No focal deficits. Asterixis present  Ext: No peripheral edema.  Palpable distal pulses.  Skin: psoriatic rash over the arms and abdomen      LABS/ STUDIES:    All imaging, laboratory studies, and other pertinent tests including electrocardiography within the last 24 hours were reviewed and are summarized within the assessment and plan.     NUTRITION:                             Raelyn Mora, MD, MD PGY3  November 21, 2023 6:58 AM

## 2023-11-21 NOTE — Unmapped (Signed)
Family Medicine Inpatient Service  History and Physical Note    Team: [facility] ([phone])  PCP: Karie Georges Pap, DO  Date of Admission: November 21, 2023  Code Status: full code  Emergency Contact: Cousin    ASSESSMENT / PLAN:   Ashley Briggs is a 57 y.o. female with a past medical history significant for chronic liver disease with hepatic encephalopathy, CAD,decompensated NASH cirrhosis, Type 2 diabetes, HLD, psoriasis on Cosentynx, Anxiety and depression who presents with dizziness.     # Hyperkalemia  K on admission was found to be 7.3. EKG demonstrated sinus arrhythmia. K improved with albuterol, insulin, calcium gluconate, and Lokelma. Per nephrology, likely secondary to her new AKI or medication side effect from home spironolactone.   - BMP Q4-6H  - Telemetry  - Hold spironolactone in setting of hyperkalemia   - Nephro recs as follows:              -- If potassium is greater than 6 obtain EKG              -- If EKG changes are present provide calcium gluconate 2 g              -- If potassium is greater than 6.5 provide calcium gluconate 2 g despite EKG              -- If EKG changes are present or potassium is greater than 6.5 provide shifting agents with insulin (5 units) and dextrose              -- If EKG changes are NOT present and potassium is less than 6.5 DO NOT provide shifting agents              -- If insulin is given please watch glucose q2hr for 6 hours              --Also consider administering lokelma each time potassium is greater than 6    # AKI   Cr elevated on admission (2.26) from baseline of 0.75. May be prerenal due to known, chronic GI losses and diuretic use at home. Improving with fluids. Per nephrology, no acute indications for acute dialysis and may require acute dialysis if medical management does not work.  - Hold Lasix and Spironolactone   - Urine Na/Cr   - Renal US   - UA and Urine culture   - Strict I/Os  - PVR     # Presyncope   Has been having multiple episodes of unsteadiness, dizziness, and pre-syncopal episodes. Neurologist wanted her to be evaluated for concern of neurological symptoms. Denies any acute weakness, has been having some chronic right arm weakness. No acute changes noted on exam. Unlikely to be acute neurologic issue, deferred CT head. Likely due to fluid losses or possible underlying arrhythmia.  - Can bolus as needed   - Telemetry    # Metabolic acidosis  Patient has been presenting with several months of diarrhea. VBG on admission demonstrates pH 7.31 with decreased HCO2 (17) and normal anion gap (14). Based on Winter's formula, pCO2 compensation should be 32-26, patient pCO2 33. Likely a NAGMA due to her chronic diarrhea.     # Hypotension  Has had chronic GI losses for the past few months. Blood pressures have been a bit soft while in the ED (90/50s). S/p 1.5 L of fluids.   - Hold Lasix and Coreg in setting of soft BPs  - Can consider additional fluid  bolus is continues to be soft in AM     # T2DM  Last A1c was 10.3 on 08/16/2023. Has a GDM at home. Per patient needs to be replaced.   - Hold metformin in setting of metabolic acidosis   - Continue home regimen of insulin: Glargine 60 units nightly and Lispro 10 units TID     # Cirrhosis   Is being followed outpatient for chronic NASH cirrhosis. Endoscopy in Feb 2024 demonstrated grade 2 esophageal varices and portal hypertensive gastropathy. Has had chronically elevated AST and ALP since September. Followed by [facility] Hepatology, last seen in February 2024 with discussion of live donor liver transplantation at that time, pending further weight loss. No fevers or abdominal tenderness that would suggest SBP. Ascites noted on renal US.   - Continue pantoprazole 40 mg   - Can consider SBP prophylaxis or diagnostic para if new fever or abdominal tenderness     # Oral lichen planus  Biopsies from endoscopy were suspicious for lichen planus which is being treated with a mouthwash.     # CAD: Continue aspirin.    # Calcified cerebral meningioma  Known small meningioma. Follows with [facility] neurosurgery and neurology. Has had chronic headaches and dizziness, unclear if related to meningioma. Annual MRIs have demonstrated stable size.     # Plaque psoriasis   - Monthly secukinumab    # Anxiety/ depression   - Continue home mirtazapine 7.5 mg   - Continue home zolpidem 5 mg      # FEN/GI:  - IVF None  - Check electrolytes as indicated, replete as needed.  - Diet Renal    # PPX:   - DVT: SQ Lovenox    # Dispo: Floor  [ ]  Anticipated Discharge Location: Home  [ ]  PT/OT/DME: PT/OT ordered  [ ]  CM/SW needs: None anticipated  [ ]  Teaching: None anticipated    HISTORY OF PRESENT ILLNESS:  Ashley Briggs is a 57 y.o. female who presents with dizziness. She has had 2 days of sudden-onset, intermittent dizziness with a room-spinning sensation, disorientation, and confusion. Has been unsteady with ambulation which has resulted in a few near-syncope episodes. She recently began taking Naltrexone and her neurologist recommended ED presentation for possible neurological etiology of her complaints. Has had multiple months of generalized abdominal pain, with associated loose stools with some dark, tarry stools. Is scheduled for OP work-up with GI. Has a history of chronic headaches with some neck stiffness. No new fevers, SOB. Some decreased appetite and diarrhea for the past few months with some reported unintentional weight loss. Chronic right hand weakness for the last two months with no acute changes.    ED Course: Initially was given albuterol, insulin/D5, and calcium gluconate to shift potassium. Subsequently got 10 g dose of Lokelma. K improved from 7.3 to 5.6 to 5.9. S/p 1.5 L fluids and Lasix. Nephrology was consulted while in ED who agreed that AKI is likely prerenal and medical management is indicated with IVF challenge and medical management of hyperkalemia.     PAST MEDICAL / SURGICAL HX:  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    Cataract associated with type 2 diabetes mellitus (CMS-HCC) 12/21/2020    Chronic headaches     Chronic pain  Cirrhosis (CMS-HCC)     Coronary artery disease involving native heart without angina pectoris 02/05/2021    Current moderate episode of major depressive disorder (CMS-HCC) 11/06/2022    Diabetes mellitus (CMS-HCC)     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 type 2 diabetes mellitus (CMS-HCC) 12/02/2022    Dry eye syndrome, bilateral 12/21/2020    Dysphagia 02/02/2023    Endometrial cancer (CMS-HCC) 2007    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    Liver disease     Major depression     Metabolic dysfunction-associated steatohepatitis (MASH)     Morbid obesity (CMS-HCC) 11/10/2019    Myopia of both eyes with astigmatism and presbyopia 12/02/2022    Nausea 09/22/2022    Non-alcoholic fatty liver disease     NPDR (nonproliferative diabetic retinopathy) (CMS-HCC)     Right eye    Other ascites 09/22/2022    Other chronic pain 11/06/2022    Other insomnia 11/06/2022    PFD (pelvic floor dysfunction) 04/07/2013    Portal hypertension (CMS-HCC) 10/08/2022    Psoriasis     Ptosis of eyelid, left     Left upper eyelid    Reflux     Retinopathy of left eye, background, proliferative 01/09/2021    Right upper quadrant abdominal pain 09/22/2022    Secondary esophageal varices without bleeding (CMS-HCC) 06/17/2022    Type 2 diabetes mellitus, with long-term current use of insulin (CMS-HCC) 12/12/2015    Last Assessment & Plan:   The current medical regimen is effective;  continue present plan and medications.  Patient will continue good management of diabetes especially with weight loss. Hopefully with further weight loss will run into the problem of being overmedicated and having to cut back on medications     Some confusion on diabetes medications was have not.been refilled for over a year.  Pa     Past Surgical History:   Procedure Laterality Date    CHOLECYSTECTOMY      HYSTERECTOMY      endometrial cancer    HYSTERECTOMY      OOPHORECTOMY      PR ANAL PRESSURE RECORD Left 03/31/2013    Procedure: ANORECTAL MANOMETRY;  Surgeon: None None;  [facility];  Service: Gastroenterology    PR BREATH HYDROGEN TEST N/A 03/31/2013    Procedure: BREATH HYDROGEN TEST;  Surgeon: None None;  Location: [facility];  Service: Gastroenterology    PR COLSC FLX W/RMVL OF TUMOR POLYP LESION SNARE TQ N/A 09/06/2020    Procedure: COLONOSCOPY FLEX; W/REMOV TUMOR/LES BY SNARE;  Surgeon: Chriss Driver, MD;  Location: [facility];  Service: Gastroenterology    PR UPPER GI ENDOSCOPY,BIOPSY N/A 04/15/2013    Procedure: UGI ENDOSCOPY; WITH BIOPSY, SINGLE OR MULTIPLE;  Surgeon: Leanne Lovely  Ruffin Frederick, MD;  Location: [facility];  Service: Gastroenterology    PR UPPER GI ENDOSCOPY,BIOPSY N/A 09/06/2020    Procedure: UGI ENDOSCOPY; WITH BIOPSY, SINGLE OR MULTIPLE;  Surgeon: Chriss Driver, MD;  Location: [facility];  Service: Gastroenterology    PR UPPER GI ENDOSCOPY,BIOPSY N/A 05/05/2022    Procedure: UGI ENDOSCOPY; WITH BIOPSY, SINGLE OR MULTIPLE;  Surgeon: Chriss Driver, MD;  Location: [facility];  Service: Gastroenterology    PR UPPER GI ENDOSCOPY,BIOPSY N/A 01/22/2023    Procedure: UGI ENDOSCOPY; WITH BIOPSY, SINGLE OR MULTIPLE;  Surgeon: Vonda Antigua, MD;  Location: [facility];  Service: Gastroenterology       FAMILY HX:    Family History   Problem Relation Age of Onset    Diabetes Mother     Cancer Mother     No Known Problems Father     No Known Problems Brother     Diabetes Maternal Grandmother     Hypertension Maternal Grandmother     Cancer Maternal Grandfather     Diabetes Maternal Grandfather     No Known Problems Paternal Grandmother     Skin cancer Paternal Grandfather     Stroke Paternal Grandfather     No Known Problems Maternal Aunt     No Known Problems Maternal Uncle     No Known Problems Paternal Aunt     No Known Problems Paternal Uncle     No Known Problems Other     Melanoma Neg Hx     Basal cell carcinoma Neg Hx     Squamous cell carcinoma Neg Hx     Substance Abuse Disorder Neg Hx     Alcohol abuse Neg Hx     Drug abuse Neg Hx     Mental illness Neg Hx     Amblyopia Neg Hx     Blindness Neg Hx     Cataracts Neg Hx     Glaucoma Neg Hx     Macular degeneration Neg Hx     Retinal detachment Neg Hx     Strabismus Neg Hx     Thyroid disease Neg Hx     Breast cancer Neg Hx        SOCIAL HX:   Social History     Socioeconomic History    Marital status: Widowed   Tobacco Use    Smoking status: Never     Passive exposure: Current    Smokeless tobacco: Never   Vaping Use    Vaping status: Never Used   Substance and Sexual Activity    Alcohol use: Not Currently     Comment: No longer    Drug use: Not Currently    Sexual activity: Not Currently     Partners: Male     Birth control/protection: Abstinence   Other Topics Concern    Do you use sunscreen? Yes    Tanning bed use? No    Are you easily burned? Yes    Excessive sun exposure? No    Blistering sunburns? Yes     Comment: When I was in my teens and twenties   Social History Narrative    Widowed No children    Not currently working:  Previously worked as a Engineer, site     Social Drivers of Psychologist, prison and probation services Strain: Low Risk  (08/18/2023)    Overall Financial Resource Strain (CARDIA)     Difficulty  of Paying Living Expenses: Not very hard   Food Insecurity: No Food Insecurity (08/18/2023)    Hunger Vital Sign     Worried About Running Out of Food in the Last Year: Never true     Ran Out of Food in the Last Year: Never true   Transportation Needs: No Transportation Needs (08/18/2023)    PRAPARE - Therapist, art (Medical): No     Lack of Transportation (Non-Medical): No   Physical Activity: Insufficiently Active (01/09/2021)    Exercise Vital Sign     Days of Exercise per Week: 1 day     Minutes of Exercise per Session: 40 min   Stress: Stress Concern Present (09/17/2022)    [employer] of Occupational Health - Occupational Stress Questionnaire     Feeling of Stress : Very much   Social Connections: Moderately Isolated (09/02/2018)    Received from [facility]    Social Connection and Isolation Panel [NHANES]     Frequency of Communication with Friends and Family: Once a week     Frequency of Social Gatherings with Friends and Family: Twice a week     Attends Religious Services: Never     Database administrator or Organizations: No     Attends Banker Meetings: Never     Marital Status: Widowed       MEDICATIONS / ALLERGIES:  (Not in a hospital admission)      No Known Allergies    IMMUNIZATIONS:  Immunization History   Administered Date(s) Administered    COVID-19 VAC,BIVALENT(8YR UP),PFIZER 06/16/2022    COVID-19 VACCINE,MRNA(MODERNA)(PF) 05/30/2020, 06/28/2020    Covid-19 Vac, (90yr+) (Spikevax) Monovalent Moderna 10/07/2022, 09/08/2023    Hep A / Hep B 07/08/2022, 08/08/2022, 01/27/2023    INFLUENZA INJ MDCK PF, QUAD,(FLUCELVAX)(63MO AND UP EGG FREE) 08/22/2020, 09/12/2021    INFLUENZA VACCINE IIV3(IM)(PF)6 MOS UP 08/31/2023, 09/13/2023    Influenza Vaccine Quad(IM)6 MO-Adult(PF) 09/09/2017, 08/20/2018, 11/10/2019, 11/10/2019, 08/06/2022, 08/06/2022    Influenza Virus Vaccine, unspecified formulation 09/12/2016, 09/10/2017    PNEUMOCOCCAL POLYSACCHARIDE 23-VALENT 11/10/2019    PPD Test 10/17/2019    Pneumococcal Conjugate 20-valent 06/16/2022    SHINGRIX-ZOSTER VACCINE (HZV),RECOMBINANT,ADJUVANTED(IM) 11/06/2022, 01/27/2023    TdaP 03/11/2007, 05/23/2020, 05/24/2021       REVIEW OF SYSTEMS:  Pertinent positives and negatives per HPI. A complete review of systems otherwise negative.    PHYSICAL EXAM:    Initial ED Vitals:   ED Triage Vitals   Enc Vitals Group      BP 11/20/23 1447 91/61      Heart Rate 11/20/23 1447 71      SpO2 Pulse 11/20/23 1912 67      Resp 11/20/23 1447 16      Temp 11/20/23 1447 36.7 ??C (98.1 ??F)      Temp Source 11/20/23 1447 Tympanic      SpO2 11/20/23 1447 100 %      Weight 11/20/23 1447 92.1 kg (203 lb)      Height 11/20/23 1447 1.626 m (5' 4)      Head Circumference --       Peak Flow --       Pain Score --       Pain Loc --       Pain Education --       Exclude from Growth Chart --        Recent Vitals:  Vitals:    11/21/23 0000  BP: 94/56   Pulse: 70   Resp: 20   Temp:    SpO2:        GEN: Well-appearing, lying in bed, NAD   Eyes: PERRL. No scleral icterus. Conjunctiva non-erythematous. EOMI. CN intact bilaterally.  CV: Regular rate and rhythm. No murmurs/rubs/gallops. No costochondral tenderness. No cyanosis or clubbing.  Pulm: CTAB. No wheezing, crackles, or rhonchi.  Abd: Protuberant, non-distended. No tenderness  Neuro: A&O x 3. No focal deficits.   Ext: No peripheral edema. Equal strength on both upper/lower extremities.   Skin: No rashes or skin lesions.  Psoriatic lesions present on abdomen.    LABS/ STUDIES:  All imaging, laboratory studies, and other pertinent tests including electrocardiography were reviewed prior to admission and are summarized within the assessment and plan.     Rolena Infante, MD, PGY1  November 21, 2023 12:23 AM

## 2023-11-21 NOTE — Unmapped (Addendum)
Admitted for irregular labs/AKI. Alert and oriented x4. BPs soft. Denies pain/dizziness/falls. Pt has a cane that she ambulates with. Fall precautions initiated. Bell alarm set for safety. Call bell in reach. Psoriasis noted to BLE. +4 edema to bilat ankles. All needs voiced and met. Report given to oncoming RN.    Problem: Adult Inpatient Plan of Care  Goal: Plan of Care Review  Outcome: Ongoing - Unchanged  Goal: Patient-Specific Goal (Individualized)  Outcome: Ongoing - Unchanged  Goal: Absence of Hospital-Acquired Illness or Injury  Outcome: Ongoing - Unchanged  Goal: Optimal Comfort and Wellbeing  Outcome: Ongoing - Unchanged  Goal: Readiness for Transition of Care  Outcome: Ongoing - Unchanged  Goal: Rounds/Family Conference  Outcome: Ongoing - Unchanged     Problem: Fall Injury Risk  Goal: Absence of Fall and Fall-Related Injury  Outcome: Ongoing - Unchanged     Problem: Self-Care Deficit  Goal: Improved Ability to Complete Activities of Daily Living  Outcome: Ongoing - Unchanged

## 2023-11-21 NOTE — Unmapped (Addendum)
Ashley Briggs is a 57 y/o F with history of CAD, decompensated NASH cirrhosis with hepatic encephalopathy, Type 2 diabetes, HLD, psoriasis on Cosentynx, anxiety and depression who is admitted for dizziness and confusion, ultimately found to have profound hyperkalemia and AKI.     Hyperkalemia  On initial presentation, patient was noted to be hyperkalemic to 7.3. EKG showed sinus arrhythmia. Patient was treated with albuterol, insulin, calcium gluconate and Lokelma with improvement in K.  Case was discussed with nephrology who felt that hyperkalemic was likely due to new AKI vs use of spironolactone at home. Spironolactone was held and patient was started on Lokelma 10 mg TID. 36 hours following admission, patient's K improved to 5.1 and Lokelma was stopped. Of note, previous diarrhea at home resolved as well without specific intervention ***    AKI, likely pre-renal  On admission, patient was noted to have an AKI with Cr 2.26 (baseline 0.8). Of note, patient had been having ~20 bowel movements daily prior to admission and taking diuretics for cirrhosis, raising suspicion for a prerenal etiology, although FEUrea was more suggestive of intra-renal pathology. Despite this, two days following hospital admission, Cr improved to 1.4 with the patient making excellent urine output, ***    Decompensated cirrhosis  Patient has MASLD cirrhosis followed by [facility], complicated by esophageal varices and portal hypertensive gastropathy. Suspect that some element of confusion and asterixis on initial presentation was secondary to decompensated cirrhosis. Patient had not been taking her home lactulose due to diarrhea. This was resumed inpatient. While admitted, her spironolactone and Lasix were held, ***

## 2023-11-21 NOTE — Unmapped (Signed)
Patient stable and pleasant today.  Complains of tremors, but no pain.        Problem: Adult Inpatient Plan of Care  Goal: Plan of Care Review  Outcome: Progressing  Goal: Patient-Specific Goal (Individualized)  Outcome: Progressing  Goal: Absence of Hospital-Acquired Illness or Injury  Outcome: Progressing  Goal: Optimal Comfort and Wellbeing  Outcome: Progressing  Goal: Readiness for Transition of Care  Outcome: Progressing  Goal: Rounds/Family Conference  Outcome: Progressing     Problem: Fall Injury Risk  Goal: Absence of Fall and Fall-Related Injury  Outcome: Progressing     Problem: Self-Care Deficit  Goal: Improved Ability to Complete Activities of Daily Living  Outcome: Progressing

## 2023-11-21 NOTE — Unmapped (Signed)
Duplicate request

## 2023-11-21 NOTE — Unmapped (Signed)
[facility]  Emergency Department Provider Note     ED Clinical Impression     Final diagnoses:   Hyperkalemia (Primary)   AKI (acute kidney injury) (CMS-HCC)      HPI, Medical Decision Making, ED Course     HPI: 57 y.o. 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 type 2 diabetes mellitus (CMS-HCC) (12/02/2022), Dry eye syndrome, bilateral (12/21/2020), Dysphagia (02/02/2023), Endometrial cancer (CMS-HCC) (2007), Esophageal varices in cirrhosis (CMS-HCC) (06/17/2022), Fractures (01/29/2018), Gastroparesis, Generalized pruritus (02/02/2023), GERD (gastroesophageal reflux disease) (It's  been going on for years), Glaucoma (2023), H/O diabetic gastroparesis (01/09/2021), Heart disease, Hemorrhage of rectum and anus (03/17/2011), Hepatic cirrhosis (CMS-HCC) (06/17/2022), Hepatic encephalopathy (CMS-HCC) (06/17/2022), Hepatic steatosis (03/13/2021), High cholesterol, psoriasis (08/06/2022), Hyperlipidemia (12/12/2015), Hypertension, 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), Liver disease, Major depression, Metabolic dysfunction-associated steatohepatitis (MASH), Morbid obesity (CMS-HCC) (11/10/2019), Myopia of both eyes with astigmatism and presbyopia (12/02/2022), Nausea (09/22/2022), Non-alcoholic fatty liver disease, NPDR (nonproliferative diabetic retinopathy) (CMS-HCC), Other ascites (09/22/2022), Other chronic pain (11/06/2022), Other insomnia (11/06/2022), PFD (pelvic floor dysfunction) (04/07/2013), Portal hypertension (CMS-HCC) (10/08/2022), Psoriasis, Ptosis of eyelid, left, Reflux, Retinopathy of left eye, background, proliferative (01/09/2021), Right upper quadrant abdominal pain (09/22/2022), Secondary esophageal varices without bleeding (CMS-HCC) (06/17/2022), and Type 2 diabetes mellitus, with long-term current use of insulin (CMS-HCC) (12/12/2015). who presents with dizziness. The patient states she has had 2 days of sudden-onset, intermittent dizziness with a room-spinning sensation, disorientation, and confusion. She reports that she feels unsteady with ambulation, which has resulted in a few near-syncopal events. She recently began taking Naltrexone. She also reports diarrhea for 1 week. She informed her Neurologist of her symptoms who recommended ED presentation for possible neurological etiology of her complaints. She reports she has had multiple months of generalized abdominal pain which is not worse than normal. She has been clinically evaluated for this, and informed it could be due to pancreatitis. Has future follow-up scheduled for further evaluation of these GI symptoms. Denies fevers, chest pain, shortness of breath, headache, unilateral weakness or numbness, blurry vision, neck pain, nausea, vomiting, leg swelling, or any other symptoms.     Of note, the patient follows with Neurology for chronic headaches, and states her headaches have been progressively worsening recently. She has a meningioma, and has an appointment scheduled with Neurosurgery in June 2024. She also endorses intermittent paresthesias in her toes, and has a future appointment with Podiatry for evaluation of this.     On exam, patient is nontoxic appearing and in no acute distress. Vital signs are WNL, hemodynamically stable, and afebrile. Heart rate and rhythm regular. Lungs clear to auscultation bilaterally. Physical exam is remarkable for patient has mild unsteady gait, but is able to walk with a cane which is her baseline. Mild left upper and lower quadrant tenderness to palpation without rebound or guarding. Alert and oriented x3. Speech normal cadence and fluent. CNs II-XII intact and symmetric. No nystagmus on smooth pursuit. Strength 5/5 in flexion/extension, symmetric x 4. No dysmetria on finger-to-nose testing. No bilateral lower extremity edema. No fluid volume overload.     DDx/MDM:  Ashley Briggs is a 57 y.o. with a PMH of HTN, HLD, CAD, T2DM c/p NPDR and cataracts, NAFLD c/p hepatic steatosis cirrhosis and esophageal varices, gastroparesis, chronic headaches, and calcified cerebral meningioma presenting with 2 days of sudden onset, intermittent room-spinning dizziness, confusion, and unsteady gait. Laboratory workup significant for hyperkalemia to 7.3, AKI with creatinine 2.2 from baseline of 0.7.  EKG does show somewhat peaked T waves, slight prolongation of PR and QRS interval from baseline.  Will give 2 g IV calcium, 5 units insulin, D50, albuterol; will plan to discuss with nephrology. Cause of AKI not clear; only new medication is naltrexone.  Possible prerenal AKI with recent diarrhea, however patient states she has been eating and drinking fairly well with no nausea or vomiting.  Feel hepatorenal syndrome is unlikely given LFTs are at baseline/improved from baseline, patient has no jaundice or signs of acute liver failure.  Patient reports compliance with lasix  and has not been taking her spironolactone. Cause of dizziness unclear, no cerebellar findings on exam, non-focal neurologic exam aside from mildly unsteady gait with cane which is baseline for patient.  Low concern for acute CVA, meningitis as cause of disorientation. Likely cause is toxic metabolic. Patient is ANO x 4 and easily conversant.  EKG and troponins without evidence of cardiac ischemia or arrhythmia.  Mildly decreased blood pressures from baseline (systolics in the 80s-90s versus baseline 100s). Will plan to shift potassium as above and give a liter of fluids.     Orders Placed This Encounter   Procedures    Critical Care    Urine Culture    Urine Culture    US Renal Complete    US Abdomen Limited    Comprehensive Metabolic Panel (CMP)    Magnesium Level    CBC w/ Differential    hsTroponin I (serial 0-2-6H w/ delta)    Urinalysis with Microscopy with Culture Reflex    hsTroponin I - 2 Hour    hsTroponin I - 6 Hour    Sodium, urine, random    Potassium, urine, random    Basic metabolic panel    Protime-INR    Blood Gas, Venous    Creatinine, Urine    Urea Nitrogen, Random Urine    Urinalysis with Microscopy    Vitamin B12 Level    Hemoglobin A1c    Full Code    Consult to Nutrition Services    Occupational Therapy Eval and Treat    Physical Therapy Eval and Treat    ECG 12 Lead Magnet    ECG 12 Lead    ECG 12 Lead    Insert peripheral IV    Saline lock IV    Place Patient in Bed    ED Admit Decision    Discharge patient    Update patient status     ED Course as of 11/25/23 1117   Fri Nov 20, 2023   2017 Potassium(!!): 7.3  Potassium is 7.3.  EKG shows widened QRS from her baseline, widened PR from her baseline, peaked T waves.  Will plan to give calcium gluconate 2 g.  Will additionally plan to give insulin plus dextrose.   2017 CMP shows the hyperkalemia as discussed above.  Additionally, AKI is present with a creatinine of 2.6 and a BUN of 43.  LFTs elevated, consistent with baseline.   2018 Spoke to nephrology.  They recommend Lokelma every 8 hours, recommend shifting with insulin and dextrose, albuterol, stabilizing cardiac membrane with 2 g.  Agree with giving fluids in the setting of AKI.  Recommend repeat ECG and BMP, reassess need for dialysis and transfer to main.   2029 Urinalysis shows moderate leukocyte esterase, negative nitrites.  31 white blood cells, although there are 7 squamous cells, so possible contamination.  No bacteria.   2029 Sodium(!): 130  Consistent with baseline   2038 ECG 12 Lead Magnet  ECG shows normal sinus rhythm with hyperacute T waves, QRS of 102 which is increased from her baseline, PR interval of 208 which is increased from her baseline   2123 hsTroponin I: <3   2123 Glucose, POC: 174   2124 ECG 12 Lead  ECG shows PR of 220, up from 208 initially.  QRS of 104, up from 102 initially.  QTc 460.  T waves appear less hyperacute than prior.   2202 INR: 1.34   2226 Repeat BMP shows improvement of her sodium to 137, improvement of potassium to 5.6, improvement of AKI to 1.96       MDM Elements  Discussion of Management with other Physicians, QHP or Appropriate Source: Admitting team - MAO and Consultant - nephrology  Independent Interpretation of Studies: EKG(s) - as above  I have reviewed recent and relevant previous record, including: Outpatient notes - 11/06/2023 [facility] End[facility] Office Visit Note - for review of patient's past medical history  Social Determinants that significantly affected care: None  ____________________________________________    The case was discussed with the attending physician, who is in agreement with the above assessment and plan.      History     Outside Historian(s): I have obtained additional history/collateral from none.    Past Medical History:   Diagnosis Date    Anemia     Angular blepharoconjunctivitis of both eyes 12/21/2020    Anxiety     Arthritis     Ascites     Blepharitis     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    Cataract associated with type 2 diabetes mellitus (CMS-HCC) 12/21/2020    Chronic headaches     Chronic pain     Chronic pain disorder 2029    I have multiple issues. 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 type 2 diabetes mellitus (CMS-HCC) 12/02/2022    Dry eye syndrome, bilateral 12/21/2020    Dysphagia 02/02/2023    Endometrial cancer (CMS-HCC) 2007    Esophageal varices in cirrhosis (CMS-HCC) 06/17/2022    Fractures 01/29/2018    Gastroparesis     Generalized pruritus 02/02/2023    GERD (gastroesophageal reflux disease) It's  been going on for years    I take medication  which helps tremendously    Glaucoma 2023    I see a doctor on a regular basis    H/O diabetic gastroparesis 01/09/2021    Heart disease     Hemorrhage of rectum and anus 03/17/2011    Hepatic cirrhosis (CMS-HCC) 06/17/2022    Hepatic encephalopathy (CMS-HCC) 06/17/2022    Hepatic steatosis 03/13/2021    Noted on CT Fall 2021    High cholesterol     Hx of psoriasis 08/06/2022    Hyperlipidemia 12/12/2015    Hypertension     Lichen  planus of tongue 01/03/2023    Liver disease     Major depression     Metabolic dysfunction-associated steatohepatitis (MASH)     Morbid obesity (CMS-HCC) 11/10/2019    Myopia of both eyes with astigmatism and presbyopia 12/02/2022    Nausea 09/22/2022    Non-alcoholic fatty liver disease     NPDR (nonproliferative diabetic retinopathy) (CMS-HCC)     Right eye    Other ascites 09/22/2022    Other chronic pain 11/06/2022    Other insomnia 11/06/2022    PFD (pelvic floor dysfunction) 04/07/2013    Portal hypertension (CMS-HCC) 10/08/2022    Psoriasis     Ptosis of eyelid, left     Left upper eyelid    Reflux     Retinopathy of left eye, background, proliferative 01/09/2021    Right upper quadrant abdominal pain 09/22/2022    Secondary esophageal varices without bleeding (CMS-HCC) 06/17/2022    Type 2 diabetes mellitus, with long-term current use of insulin (CMS-HCC) 12/12/2015    Last Assessment & Plan:   The current medical regimen is effective;  continue present plan and medications.  Patient will continue good management of diabetes especially with weight loss. Hopefully with further weight loss will run into the problem of being overmedicated and having to cut back on medications     Some confusion on diabetes medications was have not.been refilled for over a year.  Pa     Past Surgical History:   Procedure Laterality Date    CHOLECYSTECTOMY      HYSTERECTOMY      endometrial cancer    HYSTERECTOMY      OOPHORECTOMY      OTHER SURGICAL HISTORY  Hysterectomy was in 2007. Gallbladder was in 1999 and phrenectomy  was in 2024    Hysterectomy,  gallbladder removed and phrenectomy    PR ANAL PRESSURE RECORD Left 03/31/2013    Procedure: ANORECTAL MANOMETRY;  Surgeon: None None;  [facility];  Service: Gastroenterology    PR BREATH HYDROGEN TEST N/A 03/31/2013    Procedure: BREATH HYDROGEN TEST;  Surgeon: None None;  Location: [facility];  Service: Gastroenterology    PR COLSC FLX W/RMVL OF TUMOR POLYP LESION SNARE TQ N/A 09/06/2020    Procedure: COLONOSCOPY FLEX; W/REMOV TUMOR/LES BY SNARE;  Surgeon: Chriss Driver, MD;  Location: [facility];  Service: Gastroenterology    PR UPPER GI ENDOSCOPY,BIOPSY N/A 04/15/2013    Procedure: UGI ENDOSCOPY; WITH BIOPSY, SINGLE OR MULTIPLE;  Surgeon: Vickii Chafe, MD;  Location: [facility];  Service: Gastroenterology    PR UPPER GI ENDOSCOPY,BIOPSY N/A 09/06/2020    Procedure: UGI ENDOSCOPY; WITH BIOPSY, SINGLE OR MULTIPLE;  Surgeon: Chriss Driver, MD;  Location: [facility];  Service: Gastroenterology    PR UPPER GI ENDOSCOPY,BIOPSY N/A 05/05/2022    Procedure: UGI ENDOSCOPY; WITH BIOPSY, SINGLE OR MULTIPLE;  Surgeon: Chriss Driver, MD;  Location: [facility];  Service: Gastroenterology    PR UPPER GI ENDOSCOPY,BIOPSY N/A 01/22/2023    Procedure: UGI ENDOSCOPY; WITH BIOPSY, SINGLE OR MULTIPLE;  Surgeon: Vonda Antigua, MD;  Location: [facility];  Service: Gastroenterology     No current facility-administered medications for this encounter.    Current Outpatient Medications:     acetaminophen (TYLENOL) 325 MG tablet, Take 2 tablets (650 mg total) by mouth two (2) times a day as needed for pain., Disp: 180 tablet, Rfl: 1  aspirin (ECOTRIN) 81 MG tablet, Take 1 tablet (81 mg total) by mouth daily., Disp: 90 tablet, Rfl: 3    baclofen (LIORESAL) 5 mg Tab tablet, Increase to 10mg  nightly., Disp: 60 tablet, Rfl: 11    blood sugar diagnostic Strp, Test two (2) times a day (30 minutes before a meal)., Disp: 100 strip, Rfl: 1    blood-glucose meter,continuous (DEXCOM G7 RECEIVER) Misc, Use as instructed with G7 sensors., Disp: 1 each, Rfl: 1    blood-glucose sensor (DEXCOM G7 SENSOR) Devi, Use to monitor blood glucose levels continuously. Change sensor every 10 days., Disp: 9 each, Rfl: 3    blood-glucose sensor (FREESTYLE LIBRE 3 SENSOR) Devi, Use 1 sensor every 15 days, Disp: 6 each, Rfl: 3    buPROPion (WELLBUTRIN XL) 150 MG 24 hr tablet, Take 1 tablet (150 mg total) by mouth daily., Disp: , Rfl:     canagliflozin (INVOKANA) 300 mg Tab tablet, Take 1 tablet (300 mg total) by mouth daily before breakfast., Disp: 90 tablet, Rfl: 0    carvedilol (COREG) 6.25 MG tablet, Take 1 tablet (6.25 mg total) by mouth two (2) times a day., Disp: 60 tablet, Rfl: 11    chlorhexidine (PERIDEX) 0.12 % solution, 15 mL by Mouth route daily., Disp: , Rfl:     cholestyramine (QUESTRAN) 4 gram packet, Dissolve as directed and take 1 packet by mouth daily., Disp: 30 each, Rfl: 0    empty container Misc, Use as directed to dispose of Cosentyx pens., Disp: 1 each, Rfl: 2    ferrous sulfate (SLOW FE) 142 mg (45 mg iron) TbER, Take 1 tablet (142 mg) by mouth two (2) times a day., Disp: 60 tablet, Rfl: 3    furosemide (LASIX) 40 MG tablet, Take 1 tablet (40 mg total) by mouth two (2) times a day., Disp: 60 tablet, Rfl: 11    gabapentin (NEURONTIN) 300 MG capsule, Take 1 capsule (300 mg total) by mouth Three (3) times a day., Disp: 90 capsule, Rfl: 0 insulin glargine (BASAGLAR, LANTUS) 100 unit/mL (3 mL) injection pen, Inject 0.36 mL (36 Units total) under the skin nightly., Disp: 15 mL, Rfl: 3    insulin lispro (HUMALOG) 100 unit/mL injection pen, Inject 8 Units under the skin Three (3) times a day before meals., Disp: 15 mL, Rfl: 3    lactulose 10 gram/15 mL solution, Take 30 mL (20 g total) by mouth four (4) times a day., Disp: 3600 mL, Rfl: 0    lancets (ACCU-CHEK SOFTCLIX LANCETS) Misc, Use as directed to test once daily., Disp: 100 each, Rfl: 1    lidocaine (ASPERCREME) 4 % patch, Place 1 patch on the skin daily. Apply for 12 hours then remove for 12 hours., Disp: 15 patch, Rfl: 0    magnesium oxide (MAG-OX) 400 mg (241.3 mg elemental magnesium) tablet, Take 2 tablets (800 mg total) by mouth two (2) times a day., Disp: 120 tablet, Rfl: 11    metFORMIN (GLUCOPHAGE-XR) 500 MG 24 hr tablet, Take 1 tablet (500 mg total) by mouth Two (2) times a day (30 minutes before a meal)., Disp: 360 tablet, Rfl: 0    mirtazapine (REMERON) 7.5 MG tablet, Take 1 tablet (7.5 mg total) by mouth., Disp: , Rfl:     naltrexone (DEPADE) 50 mg tablet, Take 1 tablet (50 mg total) by mouth daily. Start with half a table (25 mg) a day for 2 weeks before increasing to a full tablet (50 mg) a day, Disp: 30 tablet, Rfl: 11  ondansetron (ZOFRAN-ODT) 4 MG disintegrating tablet, Dissolve 1 tablet (4 mg total) in the mouth every twelve (12) hours as needed for nausea., Disp: 30 tablet, Rfl: 0    pantoprazole (PROTONIX) 40 MG tablet, Take 1 tablet (40 mg total) by mouth two (2) times a day., Disp: 180 tablet, Rfl: 1    pen needle, diabetic (ULTICARE PEN NEEDLE) 32 gauge x 1/4 (6 mm) Ndle, Inject 1 each under the skin nightly., Disp: 100 each, Rfl: 1    promethazine (PHENERGAN) 12.5 MG tablet, Take 1 tablet (12.5 mg total) by mouth every eight (8) hours as needed for nausea., Disp: 60 tablet, Rfl: 1    rifAXIMin (XIFAXAN) 550 mg Tab, Take 1 tablet (550 mg total) by mouth two (2) times a day., Disp: 60 tablet, Rfl: 11    secukinumab (COSENTYX PEN) 150 mg/mL PnIj injection, Inject the contents of 2 pens (300 mg total) under the skin once a week at weeks 0, 1, 2, 3, and 4. THEN inject the contents of 2 pens (300 mg total) every 4 weeks thereafter., Disp: 10 mL, Rfl: 0    secukinumab (COSENTYX PEN, 2 PENS,) 150 mg/mL PnIj injection, Inject the contents of 2 pens (300 mg total) under the skin every twenty-eight (28) days. Maintenance dose, Disp: 2 mL, Rfl: 5    simvastatin (ZOCOR) 20 MG tablet, Take 1 tablet (20 mg total) by mouth every evening., Disp: 90 tablet, Rfl: 0    spironolactone (ALDACTONE) 100 MG tablet, Take 1 tablet (100 mg total) by mouth two (2) times a day., Disp: 60 tablet, Rfl: 11    zolpidem (AMBIEN) 5 MG tablet, Take 1 tablet (5 mg total) by mouth nightly as needed., Disp: , Rfl:     Allergies  Patient has no known allergies.    Family History  Family History   Problem Relation Age of Onset    Diabetes Mother     Cancer Mother         Complications during dialysis    Depression Mother     Hypertension Mother     Vision loss Mother     No Known Problems Father     No Known Problems Brother     Diabetes Maternal Grandmother     Hypertension Maternal Grandmother     Alcohol abuse Maternal Grandmother         Heavy drinker    Arthritis Maternal Grandmother     Cancer Maternal Grandfather     Diabetes Maternal Grandfather     Alcohol abuse Maternal Grandfather     Heart disease Maternal Grandfather     No Known Problems Paternal Grandmother     Skin cancer Paternal Grandfather     Stroke Paternal Grandfather     Vision loss Maternal Aunt         Heart Attack    No Known Problems Maternal Uncle     No Known Problems Paternal Aunt     No Known Problems Paternal Uncle     No Known Problems Other     Melanoma Neg Hx     Basal cell carcinoma Neg Hx     Squamous cell carcinoma Neg Hx     Substance Abuse Disorder Neg Hx     Drug abuse Neg Hx     Mental illness Neg Hx     Amblyopia Neg Hx Blindness Neg Hx     Cataracts Neg Hx     Glaucoma Neg Hx     Macular  degeneration Neg Hx     Retinal detachment Neg Hx     Strabismus Neg Hx     Thyroid disease Neg Hx     Breast cancer Neg Hx      Social History  Social History     Tobacco Use    Smoking status: Never     Passive exposure: Current    Smokeless tobacco: Never   Vaping Use    Vaping status: Never Used   Substance Use Topics    Alcohol use: Not Currently     Comment: No longer    Drug use: Not Currently     Types: Marijuana, Codeine, Hydrocodone, Morphine, Oxycodone      Physical Exam     VITAL SIGNS:      Vitals:    11/23/23 1952 11/23/23 2040 11/24/23 0805 11/24/23 0906   BP: 117/62 105/64 89/45 105/53   Pulse: 88 82 80 82   Resp: 18  18    Temp: 36.9 ??C (98.4 ??F)  36.9 ??C (98.4 ??F)    TempSrc: Oral  Oral    SpO2: 97%  96%    Weight:       Height:         Constitutional: Alert and oriented. No acute distress.  Eyes: Conjunctivae are normal.  Neck: Supple. No meningismus.   HEENT: Normocephalic and atraumatic. Conjunctivae clear. No congestion. Moist mucous membranes.   Cardiovascular: Rate as above, regular rhythm. Normal and symmetric distal pulses. Brisk capillary refill. Normal skin turgor.  Respiratory: Normal respiratory effort. Breath sounds are normal. There are no wheezing or crackles heard.  Gastrointestinal: Soft, non-distended. Mild left upper and lower quadrant tenderness to palpation without rebound or guarding.  Genitourinary: Deferred.  Musculoskeletal: Non-tender with normal range of motion in all extremities. No lower extremity edema.   Neurologic: Normal speech and language. Patient has mild unsteady gait, but is able to walk with a cane which is her baseline. Patient is moving all extremities equally, face is symmetric at rest and with speech.  Skin: Skin is warm, dry and intact. No rash noted.  Psychiatric: Mood and affect are normal. Speech and behavior are normal.     Radiology     US Abdomen Limited   Final Result --Heterogeneous, nodular liver, likely representing cirrhosis.   --Small volume right upper quadrant ascites, seen on prior.   --No biliary ductal dilatation.                US Renal Complete   Final Result   Unremarkable renal ultrasound.                        Laboratory Results     Lab Results   Component Value Date    WBC 4.0 11/24/2023    HGB 10.0 (L) 11/24/2023    HCT 31.5 (L) 11/24/2023    PLT 152 11/24/2023     Lab Results   Component Value Date    NA 136 11/24/2023    K 4.5 11/24/2023    CL 103 11/24/2023    CO2 18.8 (L) 11/24/2023    BUN 19 11/24/2023    CREATININE 0.92 11/24/2023    GLU 153 11/24/2023    CALCIUM 10.0 11/24/2023    MG 1.9 11/24/2023    PHOS 2.4 11/24/2023     Lab Results   Component Value Date    BILITOT 3.3 (H) 11/24/2023    BILIDIR 1.00 (H) 09/28/2022  PROT 8.2 11/24/2023    ALBUMIN 2.4 (L) 11/24/2023    ALT 18 11/24/2023    AST 53 (H) 11/24/2023    ALKPHOS 348 (H) 11/24/2023     Lab Results   Component Value Date    INR 1.34 11/20/2023    APTT 30.5 10/12/2023     Pertinent labs & imaging results that were available during my care of the patient were independently interpreted by me and considered in my medical decision making (see chart for details).    Portions of this record have been created using Scientist, clinical (histocompatibility and immunogenetics). Dictation errors have been sought, but may not have been identified and corrected.    Documentation assistance was provided by Aris Lot, Scribe on November 20, 2023 at 5:50 PM for Adora Fridge, MD.     Documentation assistance provided by the above mentioned scribe. I was present during the time the encounter was recorded. The information recorded by the scribe was done at my direction and has been reviewed and validated by me.        Peggyann Juba, MD  Resident  11/25/23 [phone]

## 2023-11-21 NOTE — Unmapped (Signed)
Nephrology Consult Note    Requesting Attending Physician :  Luther Redo, MD  Service Requesting Consult : Emergency Medicine  Reason for Consult: AKI, hyperK    Assessment and Plan:    # AKI  Baseline sCr around 0.8, Cr 2.26 on admission. Presenting with lightheadedness, dizziness, fatigue in s/o diarrhea and on home diuretics. UA with mod LE, 31 WBC, no RBC or protein noted. At this time, etiology of AKI likely pre-renal. Ddx includes HRS given hx of cirrhosis but seems less likely at this time. Will attempt to medically manage with IVF challenge at this time.   - No acute indications for KRT; however pt hyperK with EKG changes and may require if not improved with medical management  - Agree with IVF challenge  - Recommend holding lasix, aldactone  - Workup    - Urine lytes   - Renal US   - Urine culture  - Strict I/O    #Hyperk  Hyperkalemia: Labs notable for potassium of 7.3. Most likely contributor is AKI, medication SE. Evidence of EKG changes noted.  - Recommend holding home aldactone  - Please give lokelma 10g now, then cont TID   - Telemetry  - Consider q4-6h BMP checks              -- If potassium is greater than 6 obtain EKG              -- If EKG changes are present provide calcium gluconate 2 g              -- If potassium is greater than 6.5 provide calcium gluconate 2 g despite EKG              -- If EKG changes are present or potassium is greater than 6.5 provide shifting agents with insulin (5 units) and dextrose              -- If EKG changes are NOT present and potassium is less than 6.5 DO NOT provide shifting agents              -- If insulin is given please watch glucose q2hr for 6 hours              --Also consider administering lokelma each time potassium is greater than 6    #HypoNa; chronic, likely 2/2 cirrhosis  - CTM    # Cirrhosis   - Evaluation and management per primary team  - No changes to management from a nephrology standpoint at this time    RECOMMENDATIONS:   - No acute indication for KRT  - Would consider transfer to [facility] as pt may require KRT for management of hyperK if does not improve with medical management  - HyperK management as above  - Agree with IVF challenge  - Recommend holding lasix, aldactone  - Workup as above  - We will continue to follow.     Alwyn Ren, DO  11/20/2023 8:32 PM

## 2023-11-22 LAB — BASIC METABOLIC PANEL
ANION GAP: 12 mmol/L (ref 5–14)
BLOOD UREA NITROGEN: 29 mg/dL — ABNORMAL HIGH (ref 9–23)
BUN / CREAT RATIO: 28
CALCIUM: 9.5 mg/dL (ref 8.7–10.4)
CHLORIDE: 108 mmol/L — ABNORMAL HIGH (ref 98–107)
CO2: 20 mmol/L (ref 20.0–31.0)
CREATININE: 1.03 mg/dL — ABNORMAL HIGH (ref 0.55–1.02)
EGFR CKD-EPI (2021) FEMALE: 63 mL/min/{1.73_m2} (ref >=60)
GLUCOSE RANDOM: 94 mg/dL (ref 70–179)
POTASSIUM: 4.5 mmol/L (ref 3.4–4.8)
SODIUM: 140 mmol/L (ref 135–145)

## 2023-11-22 LAB — CBC
HEMATOCRIT: 27.6 % — ABNORMAL LOW (ref 34.0–44.0)
HEMOGLOBIN: 8.7 g/dL — ABNORMAL LOW (ref 11.3–14.9)
MEAN CORPUSCULAR HEMOGLOBIN CONC: 31.5 g/dL — ABNORMAL LOW (ref 32.0–36.0)
MEAN CORPUSCULAR HEMOGLOBIN: 23.1 pg — ABNORMAL LOW (ref 25.9–32.4)
MEAN CORPUSCULAR VOLUME: 73.2 fL — ABNORMAL LOW (ref 77.6–95.7)
MEAN PLATELET VOLUME: 8.2 fL (ref 6.8–10.7)
NUCLEATED RED BLOOD CELLS: 0 /100{WBCs} (ref <=4)
PLATELET COUNT: 137 10*9/L — ABNORMAL LOW (ref 150–450)
RED BLOOD CELL COUNT: 3.77 10*12/L — ABNORMAL LOW (ref 3.95–5.13)
RED CELL DISTRIBUTION WIDTH: 20.2 % — ABNORMAL HIGH (ref 12.2–15.2)
WBC ADJUSTED: 3.3 10*9/L — ABNORMAL LOW (ref 3.6–11.2)

## 2023-11-22 LAB — COMPREHENSIVE METABOLIC PANEL
ALBUMIN: 2 g/dL — ABNORMAL LOW (ref 3.4–5.0)
ALKALINE PHOSPHATASE: 323 U/L — ABNORMAL HIGH (ref 46–116)
ALT (SGPT): 18 U/L (ref 10–49)
ANION GAP: 14 mmol/L (ref 5–14)
AST (SGOT): 46 U/L — ABNORMAL HIGH (ref <=34)
BILIRUBIN TOTAL: 2.9 mg/dL — ABNORMAL HIGH (ref 0.3–1.2)
BLOOD UREA NITROGEN: 39 mg/dL — ABNORMAL HIGH (ref 9–23)
BUN / CREAT RATIO: 28
CALCIUM: 9.3 mg/dL (ref 8.7–10.4)
CHLORIDE: 110 mmol/L — ABNORMAL HIGH (ref 98–107)
CO2: 17.2 mmol/L — ABNORMAL LOW (ref 20.0–31.0)
CREATININE: 1.4 mg/dL — ABNORMAL HIGH (ref 0.55–1.02)
EGFR CKD-EPI (2021) FEMALE: 43 mL/min/{1.73_m2} — ABNORMAL LOW (ref >=60)
GLUCOSE RANDOM: 150 mg/dL (ref 70–179)
POTASSIUM: 5.1 mmol/L (ref 3.5–5.1)
PROTEIN TOTAL: 7.3 g/dL (ref 5.7–8.2)
SODIUM: 141 mmol/L (ref 135–145)

## 2023-11-22 LAB — MAGNESIUM: MAGNESIUM: 1.9 mg/dL (ref 1.6–2.6)

## 2023-11-22 LAB — PHOSPHORUS: PHOSPHORUS: 3.7 mg/dL (ref 2.4–5.1)

## 2023-11-22 MED ADMIN — clotrimazole (MYCELEX) troche 10 mg: 10 mg | ORAL

## 2023-11-22 MED ADMIN — pravastatin (PRAVACHOL) tablet 40 mg: 40 mg | ORAL | @ 03:00:00

## 2023-11-22 MED ADMIN — naltrexone (DEPADE) tablet 50 mg: 50 mg | ORAL | @ 14:00:00

## 2023-11-22 MED ADMIN — insulin lispro (HumaLOG) injection 10 Units: 10 [IU] | SUBCUTANEOUS | @ 17:00:00

## 2023-11-22 MED ADMIN — aspirin chewable tablet 81 mg: 81 mg | ORAL | @ 14:00:00

## 2023-11-22 MED ADMIN — clotrimazole (MYCELEX) troche 10 mg: 10 mg | ORAL | @ 14:00:00

## 2023-11-22 MED ADMIN — baclofen (LIORESAL) tablet 10 mg: 10 mg | ORAL | @ 03:00:00

## 2023-11-22 MED ADMIN — magnesium oxide (MAG-OX) tablet 800 mg: 800 mg | ORAL | @ 14:00:00

## 2023-11-22 MED ADMIN — gabapentin (NEURONTIN) capsule 300 mg: 300 mg | ORAL | @ 03:00:00

## 2023-11-22 MED ADMIN — enoxaparin (LOVENOX) syringe 40 mg: 40 mg | SUBCUTANEOUS | @ 12:00:00

## 2023-11-22 MED ADMIN — mirtazapine (REMERON) tablet 7.5 mg: 7.5 mg | ORAL | @ 03:00:00

## 2023-11-22 MED ADMIN — insulin glargine (LANTUS) injection 40 Units: 40 [IU] | SUBCUTANEOUS | @ 14:00:00

## 2023-11-22 MED ADMIN — rifAXIMin (XIFAXAN) tablet 550 mg: 550 mg | ORAL | @ 03:00:00 | Stop: 2023-12-01

## 2023-11-22 MED ADMIN — buPROPion (Wellbutrin XL) 24 hr tablet 150 mg: 150 mg | ORAL | @ 14:00:00

## 2023-11-22 MED ADMIN — clotrimazole (MYCELEX) troche 10 mg: 10 mg | ORAL | @ 17:00:00

## 2023-11-22 MED ADMIN — sodium zirconium cyclosilicate (LOKELMA) packet 10 g: 10 g | ORAL | @ 14:00:00 | Stop: 2023-11-22

## 2023-11-22 MED ADMIN — gabapentin (NEURONTIN) capsule 300 mg: 300 mg | ORAL | @ 20:00:00

## 2023-11-22 MED ADMIN — chlorhexidine (PERIDEX) 0.12 % solution 15 mL: 15 mL | OROMUCOSAL | @ 14:00:00

## 2023-11-22 MED ADMIN — insulin lispro (HumaLOG) injection 10 Units: 10 [IU] | SUBCUTANEOUS | @ 14:00:00

## 2023-11-22 MED ADMIN — lactulose oral solution: 20 g | ORAL | @ 14:00:00

## 2023-11-22 MED ADMIN — cholestyramine (QUESTRAN) 4 gram packet 1 packet: 1 | ORAL | @ 12:00:00

## 2023-11-22 MED ADMIN — magnesium oxide (MAG-OX) tablet 800 mg: 800 mg | ORAL | @ 03:00:00

## 2023-11-22 MED ADMIN — empagliflozin (JARDIANCE) tablet 25 mg: 25 mg | ORAL | @ 14:00:00

## 2023-11-22 MED ADMIN — rifAXIMin (XIFAXAN) tablet 550 mg: 550 mg | ORAL | @ 14:00:00 | Stop: 2023-12-01

## 2023-11-22 MED ADMIN — pantoprazole (Protonix) EC tablet 40 mg: 40 mg | ORAL | @ 03:00:00

## 2023-11-22 MED ADMIN — clotrimazole (MYCELEX) troche 10 mg: 10 mg | ORAL | @ 03:00:00

## 2023-11-22 MED ADMIN — pantoprazole (Protonix) EC tablet 40 mg: 40 mg | ORAL | @ 14:00:00

## 2023-11-22 MED ADMIN — gabapentin (NEURONTIN) capsule 300 mg: 300 mg | ORAL | @ 14:00:00

## 2023-11-22 MED ADMIN — lactulose oral solution: 20 g | ORAL | @ 03:00:00

## 2023-11-22 MED ADMIN — zolpidem (AMBIEN) tablet 5 mg: 5 mg | ORAL | @ 03:00:00

## 2023-11-22 MED ADMIN — lactulose oral solution: 20 g | ORAL | @ 20:00:00

## 2023-11-22 MED ADMIN — sodium zirconium cyclosilicate (LOKELMA) packet 10 g: 10 g | ORAL | @ 05:00:00 | Stop: 2023-11-22

## 2023-11-22 MED ADMIN — acetaminophen (TYLENOL) tablet 650 mg: 650 mg | ORAL | @ 03:00:00

## 2023-11-22 NOTE — Unmapped (Signed)
Family Medicine Inpatient Service  Progress Note    Team: Family Medicine [place] [facility] (pgr [phone])    Hospital Day: 2    ASSESSMENT / PLAN:   Ashley Briggs is a 57 y.o. female with a past medical history significant for CAD, decompensated NASH cirrhosis, Type 2 diabetes, HLD, psoriasis on Cosentynx, Anxiety and depression who presents with dizziness and found to have AKI and hyperkalemia.     # Hyperkalemia (improved)  K on admission was found to be 7.3. EKG demonstrated sinus arrhythmia. K improved with albuterol, insulin, calcium gluconate, and Lokelma. Per nephrology, likely secondary to her new AKI or medication side effect from home spironolactone. K has considerably improved throughout admission since starting [facility] and holding spironolactone.   - BMP q12h  - S/p Lokelma 10g q8h through 12/22. Will stop as K is normalizing after removing offending agents  - Discontinuing telemetry, no issues to date  - Hold spironolactone in setting of hyperkalemia   - Nephro recs as follows:              -- If potassium is greater than 6 obtain EKG              -- If EKG changes are present provide calcium gluconate 2 g              -- If potassium is greater than 6.5 provide calcium gluconate 2 g despite EKG              -- If EKG changes are present or potassium is greater than 6.5 provide shifting agents with insulin (5 units) and dextrose              -- If EKG changes are NOT present and potassium is less than 6.5 DO NOT provide shifting agents              -- If insulin is given please watch glucose q2hr for 6 hours              --Also consider administering lokelma each time potassium is greater than 6    # AKI   Cr elevated on admission (2.26) from baseline of 0.75. initially thought to be prerenal due to acute GI loses in the last week or so 20 Bms a day and diuretic use. However. FeUrea consistent w/ intra-renal cause. Some concern Per nephrology, no acute indications for acute dialysis and may require acute dialysis if medical management does not work. To date, AKI has steadily improved though remains above baseline, which does support pre-renal etiology as patient's diarrhea has drastically improved.  - Hold Lasix and Spironolactone   - Renal US done, unremarkable  - UA and Urine culture: UA with moderate LE and 30 WBCs but appears contamined with squamous cells, would not treat based on this result. Ucx in process.   - Strict I/Os  - Consider nephrology/GI consults on 12/23 for AKI iso cirrhosis    # MASLD Cirrhosis   # Asterixis  Is being followed by [facility] hepatology outpatient for chronic NASH cirrhosis. In discussions about transplant. Endoscopy in Feb 2024 demonstrated grade 2 esophageal varices and portal hypertensive gastropathy.  No fevers or abdominal tenderness that would suggest SBP. Ascites noted on renal US. MELD-Na score 26. Per pt on lactulose at home, but hasn't taken in a week or more due to diarrhea -- this has since resolved and lactulose has been presumed.   - Continue pantoprazole 40 mg   - Can consider  SBP prophylaxis or diagnostic para if new fever or abdominal tenderness  - Hold lasix and spiro as above  - Lactulose TID -- titrate to 2-3 soft Bms a day     # Presyncope (resolved)  Has been having multiple episodes of unsteadiness, dizziness, and pre-syncopal episodes. NO acute weakness or focal symptoms. Neuro exam normal, so Ct head deferred. Orthostats negative. Likely due to fluid losses or possible underlying arrhythmia. Symptoms largely resolved as of 12/22.   - Can bolus as needed      # Metabolic acidosis  Patient has been presenting with several months of diarrhea. VBG on admission demonstrates pH 7.31 with decreased HCO2 (17) and normal anion gap (14). Based on Winter's formula, pCO2 compensation should be 32-26, patient pCO2 33. Likely a NAGMA due to her chronic diarrhea.      # Hypotension  Has had chronic GI losses for the past few months. Blood pressures have been a bit soft while in the ED (90/50s). S/p 1.5 L of fluids.   - Initially held Lasix and Coreg in setting of soft BP. Resume Coreg for esophageal varices on 12/22     # T2DM  Last A1c was 10.3 on 08/16/2023. Has a GDM at home. Per patient needs to be replaced. Blood glucose largely at goal throughout admission so far.   - Hold metformin in setting of metabolic acidosis   - Insulin regimen: Glargine 40 units nightly (home regimen is 60 units) and Lispro 10 units TID.       # Oral lichen planus  Biopsies from endoscopy were suspicious for lichen planus which is being treated with a mouthwash.      # CAD: Continue aspirin.     # Calcified cerebral meningioma  Known small meningioma. Follows with [facility] neurosurgery and neurology. Has had chronic headaches and dizziness, unclear if related to meningioma. Annual MRIs have demonstrated stable size.      # Plaque psoriasis   - Monthly secukinumab     # Anxiety/ depression   - Continue home mirtazapine 7.5 mg   - Continue home zolpidem 5 mg        # Checklist:  - IVF None  - Daily labs needed: CBC, BMP, and Magnesium  - Diet Regular  - Bowel Regimen: Miralax 17 g daily PRN  - DVT: SQ Lovenox  - Code Status:   Orders Placed This Encounter   Procedures    Full Code     Standing Status:   Standing     Number of Occurrences:   1     - Dispo: Floor    [ ]  Anticipated Discharge Location: Home with home health  [ ]  PT/OT/DME: No needs anticipated  [ ]  CM/SW needs: None anticipated  [ ]  Follow up appt: Appointment needed    SUBJECTIVE:  Interval events: NAEO. K stable, Cr downtrending at 2100 without new interventions. Today, she reports feeling well overall. Continues to have some shakiness in upper extremities. Endorses about 3 bowel movements yesterday. Voiding without issue.       REVIEW OF SYSTEMS:  Pertinent positives and negatives per HPI. A complete review of systems otherwise negative.    PHYSICAL EXAM:      Intake/Output Summary (Last 24 hours) at 11/22/2023 1112  Last data filed at 11/22/2023 0515  Gross per 24 hour   Intake --   Output 2800 ml   Net -2800 ml       Recent Vitals:  Vitals:  11/22/23 0730   BP: 112/58   Pulse: 75   Resp: 17   Temp: 36.8 ??C (98.2 ??F)   SpO2: 99%       GEN: non-toxic appearing, lying in bed, NAD   HEENT: NCAT, No scleral icterus. Conjunctiva non-erythematous. MMM.  CV: Regular rate and rhythm. No murmurs/rubs/gallops.  Pulm: Normal work of breathing on RA. CTAB. No wheezing, crackles, or rhonchi.  Abd:  Mild RUQ tenderness. No guarding, rebound.  Normoactive bowel sounds.    Neuro: A&O x 3. No focal deficits. Asterixis present  Ext: No peripheral edema.  Palpable distal pulses.  Skin: psoriatic rash over the arms and abdomen      LABS/ STUDIES:    All imaging, laboratory studies, and other pertinent tests including electrocardiography within the last 24 hours were reviewed and are summarized within the assessment and plan.     NUTRITION:                             Vickki Muff, MD  November 21, 2023 6:58 AM

## 2023-11-22 NOTE — Unmapped (Addendum)
Vss; afebrile. Monitoring labs. Fall precautions maintained. Prn tylenol given for pain and effective. Pt slept well with prn ambien. All needs voiced and met. Will continue to monitor.    Problem: Adult Inpatient Plan of Care  Goal: Plan of Care Review  Outcome: Ongoing - Unchanged  Goal: Patient-Specific Goal (Individualized)  Outcome: Ongoing - Unchanged  Goal: Absence of Hospital-Acquired Illness or Injury  Outcome: Ongoing - Unchanged  Intervention: Identify and Manage Fall Risk  Recent Flowsheet Documentation  Taken 11/22/2023 0400 by Sigmund Hazel, RN  Safety Interventions:   fall reduction program maintained   nonskid shoes/slippers when out of bed  Taken 11/22/2023 0200 by Sigmund Hazel, RN  Safety Interventions:   fall reduction program maintained   nonskid shoes/slippers when out of bed  Taken 11/22/2023 0000 by Sigmund Hazel, RN  Safety Interventions:   fall reduction program maintained   nonskid shoes/slippers when out of bed  Taken 11/21/2023 2200 by Sigmund Hazel, RN  Safety Interventions:   fall reduction program maintained   nonskid shoes/slippers when out of bed  Taken 11/21/2023 2000 by Sigmund Hazel, RN  Safety Interventions:   fall reduction program maintained   nonskid shoes/slippers when out of bed  Intervention: Prevent and Manage VTE (Venous Thromboembolism) Risk  Recent Flowsheet Documentation  Taken 11/22/2023 0400 by Sigmund Hazel, RN  Anti-Embolism Device Status: (lovenox) --  Taken 11/22/2023 0200 by Sigmund Hazel, RN  Anti-Embolism Device Status: (lovenox) --  Taken 11/22/2023 0000 by Sigmund Hazel, RN  Anti-Embolism Device Status: (lovenox) --  Taken 11/21/2023 2200 by Sigmund Hazel, RN  Anti-Embolism Device Status: (lovenox) --  Taken 11/21/2023 2000 by Sigmund Hazel, RN  Anti-Embolism Device Status: (lovenox) Other (Comment)  Goal: Optimal Comfort and Wellbeing  Outcome: Ongoing - Unchanged  Goal: Readiness for Transition of Care  Outcome: Ongoing - Unchanged  Goal: Rounds/Family Conference  Outcome: Ongoing - Unchanged     Problem: Fall Injury Risk  Goal: Absence of Fall and Fall-Related Injury  Outcome: Ongoing - Unchanged  Intervention: Promote Injury-Free Environment  Recent Flowsheet Documentation  Taken 11/22/2023 0400 by Sigmund Hazel, RN  Safety Interventions:   fall reduction program maintained   nonskid shoes/slippers when out of bed  Taken 11/22/2023 0200 by Sigmund Hazel, RN  Safety Interventions:   fall reduction program maintained   nonskid shoes/slippers when out of bed  Taken 11/22/2023 0000 by Sigmund Hazel, RN  Safety Interventions:   fall reduction program maintained   nonskid shoes/slippers when out of bed  Taken 11/21/2023 2200 by Sigmund Hazel, RN  Safety Interventions:   fall reduction program maintained   nonskid shoes/slippers when out of bed  Taken 11/21/2023 2000 by Sigmund Hazel, RN  Safety Interventions:   fall reduction program maintained   nonskid shoes/slippers when out of bed     Problem: Self-Care Deficit  Goal: Improved Ability to Complete Activities of Daily Living  Outcome: Ongoing - Unchanged

## 2023-11-23 ENCOUNTER — Ambulatory Visit (HOSPITAL_BASED_OUTPATIENT_CLINIC_OR_DEPARTMENT_OTHER): Payer: Medicaid Other | Admitting: Pain Medicine

## 2023-11-23 ENCOUNTER — Encounter: Payer: Self-pay | Admitting: Pain Medicine

## 2023-11-23 ENCOUNTER — Encounter: Payer: Self-pay | Admitting: Internal Medicine

## 2023-11-23 DIAGNOSIS — G8929 Other chronic pain: Secondary | ICD-10-CM

## 2023-11-23 DIAGNOSIS — Z91199 Patient's noncompliance with other medical treatment and regimen due to unspecified reason: Secondary | ICD-10-CM

## 2023-11-23 LAB — COMPREHENSIVE METABOLIC PANEL
ALBUMIN: 2.1 g/dL — ABNORMAL LOW (ref 3.4–5.0)
ALKALINE PHOSPHATASE: 324 U/L — ABNORMAL HIGH (ref 46–116)
ALT (SGPT): 17 U/L (ref 10–49)
ANION GAP: 11 mmol/L (ref 5–14)
AST (SGOT): 49 U/L — ABNORMAL HIGH (ref <=34)
BILIRUBIN TOTAL: 2.9 mg/dL — ABNORMAL HIGH (ref 0.3–1.2)
BLOOD UREA NITROGEN: 23 mg/dL (ref 9–23)
BUN / CREAT RATIO: 23
CALCIUM: 9.4 mg/dL (ref 8.7–10.4)
CHLORIDE: 109 mmol/L — ABNORMAL HIGH (ref 98–107)
CO2: 19.6 mmol/L — ABNORMAL LOW (ref 20.0–31.0)
CREATININE: 1.02 mg/dL (ref 0.55–1.02)
EGFR CKD-EPI (2021) FEMALE: 64 mL/min/{1.73_m2} (ref >=60)
GLUCOSE RANDOM: 115 mg/dL (ref 70–179)
POTASSIUM: 4.4 mmol/L (ref 3.4–4.8)
PROTEIN TOTAL: 7.4 g/dL (ref 5.7–8.2)
SODIUM: 140 mmol/L (ref 135–145)

## 2023-11-23 LAB — BASIC METABOLIC PANEL
ANION GAP: 13 mmol/L (ref 5–14)
BLOOD UREA NITROGEN: 24 mg/dL — ABNORMAL HIGH (ref 9–23)
BUN / CREAT RATIO: 22
CALCIUM: 9.4 mg/dL (ref 8.7–10.4)
CHLORIDE: 104 mmol/L (ref 98–107)
CO2: 20.4 mmol/L (ref 20.0–31.0)
CREATININE: 1.07 mg/dL — ABNORMAL HIGH (ref 0.55–1.02)
EGFR CKD-EPI (2021) FEMALE: 60 mL/min/{1.73_m2} (ref >=60)
GLUCOSE RANDOM: 140 mg/dL (ref 70–179)
POTASSIUM: 4.8 mmol/L (ref 3.4–4.8)
SODIUM: 137 mmol/L (ref 135–145)

## 2023-11-23 LAB — CBC
HEMATOCRIT: 29 % — ABNORMAL LOW (ref 34.0–44.0)
HEMOGLOBIN: 9.1 g/dL — ABNORMAL LOW (ref 11.3–14.9)
MEAN CORPUSCULAR HEMOGLOBIN CONC: 31.4 g/dL — ABNORMAL LOW (ref 32.0–36.0)
MEAN CORPUSCULAR HEMOGLOBIN: 23.1 pg — ABNORMAL LOW (ref 25.9–32.4)
MEAN CORPUSCULAR VOLUME: 73.5 fL — ABNORMAL LOW (ref 77.6–95.7)
MEAN PLATELET VOLUME: 7.9 fL (ref 6.8–10.7)
PLATELET COUNT: 137 10*9/L — ABNORMAL LOW (ref 150–450)
RED BLOOD CELL COUNT: 3.94 10*12/L — ABNORMAL LOW (ref 3.95–5.13)
RED CELL DISTRIBUTION WIDTH: 20 % — ABNORMAL HIGH (ref 12.2–15.2)
WBC ADJUSTED: 3.1 10*9/L — ABNORMAL LOW (ref 3.6–11.2)

## 2023-11-23 LAB — URINALYSIS WITH MICROSCOPY
BACTERIA: NONE SEEN /HPF
BILIRUBIN UA: NEGATIVE
BLOOD UA: NEGATIVE
GLUCOSE UA: >1000 — AB
KETONES UA: NEGATIVE
LEUKOCYTE ESTERASE UA: NEGATIVE
NITRITE UA: NEGATIVE
PH UA: 5.5 (ref 5.0–9.0)
PROTEIN UA: NEGATIVE
RBC UA: <1 /HPF (ref <=4)
SPECIFIC GRAVITY UA: 1.015 (ref 1.003–1.030)
SQUAMOUS EPITHELIAL: <1 /HPF (ref 0–5)
UROBILINOGEN UA: <2
WBC UA: 2 /HPF (ref 0–5)

## 2023-11-23 LAB — MAGNESIUM: MAGNESIUM: 1.8 mg/dL (ref 1.6–2.6)

## 2023-11-23 LAB — PHOSPHORUS: PHOSPHORUS: 2.5 mg/dL (ref 2.4–5.1)

## 2023-11-23 MED ADMIN — gabapentin (NEURONTIN) capsule 300 mg: 300 mg | ORAL | @ 02:00:00

## 2023-11-23 MED ADMIN — clotrimazole (MYCELEX) troche 10 mg: 10 mg | ORAL | @ 15:00:00

## 2023-11-23 MED ADMIN — magnesium oxide (MAG-OX) tablet 800 mg: 800 mg | ORAL | @ 15:00:00

## 2023-11-23 MED ADMIN — gabapentin (NEURONTIN) capsule 300 mg: 300 mg | ORAL | @ 15:00:00

## 2023-11-23 MED ADMIN — acetaminophen (TYLENOL) tablet 650 mg: 650 mg | ORAL | @ 01:00:00

## 2023-11-23 MED ADMIN — clotrimazole (MYCELEX) troche 10 mg: 10 mg | ORAL | @ 19:00:00

## 2023-11-23 MED ADMIN — mirtazapine (REMERON) tablet 7.5 mg: 7.5 mg | ORAL | @ 02:00:00

## 2023-11-23 MED ADMIN — pravastatin (PRAVACHOL) tablet 40 mg: 40 mg | ORAL | @ 02:00:00

## 2023-11-23 MED ADMIN — gabapentin (NEURONTIN) capsule 300 mg: 300 mg | ORAL | @ 19:00:00

## 2023-11-23 MED ADMIN — clotrimazole (MYCELEX) troche 10 mg: 10 mg | ORAL | @ 23:00:00

## 2023-11-23 MED ADMIN — lactulose oral solution: 20 g | ORAL | @ 02:00:00

## 2023-11-23 MED ADMIN — magnesium oxide (MAG-OX) tablet 800 mg: 800 mg | ORAL | @ 02:00:00

## 2023-11-23 MED ADMIN — pantoprazole (Protonix) EC tablet 40 mg: 40 mg | ORAL | @ 02:00:00

## 2023-11-23 MED ADMIN — baclofen (LIORESAL) tablet 10 mg: 10 mg | ORAL | @ 02:00:00

## 2023-11-23 MED ADMIN — rifAXIMin (XIFAXAN) tablet 550 mg: 550 mg | ORAL | @ 15:00:00 | Stop: 2023-12-01

## 2023-11-23 MED ADMIN — insulin lispro (HumaLOG) injection 8 Units: 8 [IU] | SUBCUTANEOUS | @ 20:00:00

## 2023-11-23 MED ADMIN — aspirin chewable tablet 81 mg: 81 mg | ORAL | @ 15:00:00

## 2023-11-23 MED ADMIN — empagliflozin (JARDIANCE) tablet 25 mg: 25 mg | ORAL | @ 15:00:00

## 2023-11-23 MED ADMIN — spironolactone (ALDACTONE) tablet 100 mg: 100 mg | ORAL | @ 15:00:00

## 2023-11-23 MED ADMIN — polyethylene glycol (MIRALAX) packet 17 g: 17 g | ORAL | @ 02:00:00

## 2023-11-23 MED ADMIN — buPROPion (Wellbutrin XL) 24 hr tablet 150 mg: 150 mg | ORAL | @ 15:00:00

## 2023-11-23 MED ADMIN — pantoprazole (Protonix) EC tablet 40 mg: 40 mg | ORAL | @ 15:00:00

## 2023-11-23 MED ADMIN — lactulose oral solution: 20 g | ORAL | @ 18:00:00

## 2023-11-23 MED ADMIN — chlorhexidine (PERIDEX) 0.12 % solution 15 mL: 15 mL | OROMUCOSAL | @ 15:00:00

## 2023-11-23 MED ADMIN — lactulose oral solution: 20 g | ORAL | @ 23:00:00

## 2023-11-23 MED ADMIN — enoxaparin (LOVENOX) syringe 40 mg: 40 mg | SUBCUTANEOUS | @ 11:00:00

## 2023-11-23 MED ADMIN — naltrexone (DEPADE) tablet 50 mg: 50 mg | ORAL | @ 15:00:00

## 2023-11-23 MED ADMIN — clotrimazole (MYCELEX) troche 10 mg: 10 mg | ORAL | @ 04:00:00

## 2023-11-23 MED ADMIN — furosemide (LASIX) tablet 40 mg: 40 mg | ORAL | @ 15:00:00

## 2023-11-23 MED ADMIN — rifAXIMin (XIFAXAN) tablet 550 mg: 550 mg | ORAL | @ 02:00:00 | Stop: 2023-12-01

## 2023-11-23 NOTE — Unmapped (Signed)
Pt is AxOx4. ACHS continues. Bruising scattered. NPO at MN for RUQ [place] in am. C/O of sleepiness, denies pain. VSS. Call light within reach. Monitoring ongoing.  Problem: Adult Inpatient Plan of Care  Goal: Plan of Care Review  Outcome: Progressing  Goal: Patient-Specific Goal (Individualized)  Outcome: Progressing  Goal: Absence of Hospital-Acquired Illness or Injury  Outcome: Progressing  Intervention: Identify and Manage Fall Risk  Recent Flowsheet Documentation  Taken 11/22/2023 1549 by Lucendia Herrlich, RN  Safety Interventions:   fall reduction program maintained   assistive device  Taken 11/22/2023 1400 by Lucendia Herrlich, RN  Safety Interventions:   fall reduction program maintained   assistive device  Taken 11/22/2023 1156 by Lucendia Herrlich, RN  Safety Interventions:   fall reduction program maintained   assistive device  Taken 11/22/2023 1000 by Lucendia Herrlich, RN  Safety Interventions:   fall reduction program maintained   assistive device  Taken 11/22/2023 0740 by Lucendia Herrlich, RN  Safety Interventions:   fall reduction program maintained   assistive device  Goal: Optimal Comfort and Wellbeing  Outcome: Progressing  Goal: Readiness for Transition of Care  Outcome: Progressing  Goal: Rounds/Family Conference  Outcome: Progressing     Problem: Fall Injury Risk  Goal: Absence of Fall and Fall-Related Injury  Outcome: Progressing  Intervention: Promote Injury-Free Environment  Recent Flowsheet Documentation  Taken 11/22/2023 1549 by Lucendia Herrlich, RN  Safety Interventions:   fall reduction program maintained   assistive device  Taken 11/22/2023 1400 by Lucendia Herrlich, RN  Safety Interventions:   fall reduction program maintained   assistive device  Taken 11/22/2023 1156 by Lucendia Herrlich, RN  Safety Interventions:   fall reduction program maintained   assistive device  Taken 11/22/2023 1000 by Lucendia Herrlich, RN  Safety Interventions:   fall reduction program maintained   assistive device  Taken 11/22/2023 0740 by Lucendia Herrlich, RN  Safety Interventions:   fall reduction program maintained   assistive device     Problem: Self-Care Deficit  Goal: Improved Ability to Complete Activities of Daily Living  Outcome: Progressing

## 2023-11-23 NOTE — Unmapped (Signed)
OCCUPATIONAL THERAPY  Evaluation (11/23/23 1415)    Patient Name:  Ashley Briggs       Medical Record Number: [id number]     Date of Birth: [birth year]  Sex: Female      Post-Discharge Occupational Therapy Recommendations: 3x weekly          Equipment Recommendation  OT DME Recommendations: None       OT Treatment Diagnosis: Generalized muscle weakness    OT eval and treat; 'admitted for hyperkalemia and AKI, please evaluate for safe dispo planning'    Assessment  Problem List: Dizziness/vertigo, Decreased strength, Decreased mobility, Impaired ADLs, Decreased endurance, Impaired balance, Impaired sensation, Fall risk        Clinical Decision Making: Low Complexity    Assessment: Ashley Briggs is a 57 y.o. female with a past medical history significant for chronic liver disease with hepatic encephalopathy, CAD,decompensated NASH cirrhosis, Type 2 diabetes, HLD, psoriasis on Cosentynx, Anxiety and depression who presents with dizziness.         Pt reporting that prior to admission, she was independent with all BADLs and IADLs, including driving. Pt ambulated with a straight cane at baseline 2/2 occasional dizziness and endorses no falls.    Pt seated in recliner upon OT arrival educated on the role of OT and OT evaluation in the acute care setting with Pt agreeable to session.     Pt presents to acute OT with the above-mentioned conditions and below baseline function level with ADLs and functional transfers on this date, with Pt completing sit <> stand from recliner with CGA using straight cane; functional ambulation ~60ft x2 with SBA/CGA using straight cane; toilet t/f with CGA using grab bars and cane; standing at sink-level for handwashing with CGA; LB dressing (doffing/donning hospital socks) with supervision seated in recliner. Pt deferring additional tasks 2/2 fatigue. Observed deficits, including impaired sensation, decreased balance, decreased endurance, decreased functional strength, and pain all currently impacting Pt's independence and safety with ADLs and functional transfers compared with baseline.  Pt will benefit from skilled acute OT services to address observed deficits and maximize independence and safety with ADLs. Recommend 3x post-acute. DME: None.     After review of the patient's occupational profile and history, assessment of occupational performance, clinical decision making, and development of POC, the patient presents as a Low complexity case.    Today's Interventions: ADL retraining, Balance activities, Compensatory tech. training, Conservation, Functional mobility, Education - Patient, Safety education, Postular / Proximal stability, Transfer training  Today's Interventions: Pt educated on role of OT eval, OT POC, and safety; functional transfers, LB dressing, functional ambulation, grooming, toileting, AM-PAC    Activity Tolerance During Today's Session  Tolerated treatment well    Plan  Planned Frequency of Treatment: Plan of Care Initiated: 11/23/23  1-2x per day Weekly Frequency: 3-4 days per week  Planned Treatment Duration: 11/30/23    Planned Interventions:  Education (Patient/Family/Caregiver), Home Exercise Program, Therapeutic Activity, Therapeutic Exercise, Self-Care/Home Training      GOALS:   Patient and Family Goals: None specified    Short Term:   SHORT GOAL #1: Pt will complete toilet t/f with supervision using LRAD; manage clothing and toileting tasks with mod(I).   Time Frame : 1 week  SHORT GOAL #2: Pt will complete full body ADL with min A using LRAD.   Time Frame : 1 week  SHORT GOAL #3: Pt will complete 2+ grooming tasks standing at sink-level with SBA using LRAD.   Time Frame :  1 week   Long Term Goal #1: Pt will maximize independence and safety with ADLs and functional transfers within 4 weeks.  Time Frame: 4 weeks    Prognosis:  Good  Positive Indicators:  PLOF, CLOF, motivation  Barriers to Discharge: Impaired Balance, Gait instability    Subjective  Medical Updates Since Last Visit/Relevant PMH Affecting Clinical Decision Making:    Prior Functional Status Pt reporting that prior to admission, she was independent with all BADLs and IADLs, including driving. Pt ambulated with a straight cane at baseline 2/2 occasional dizziness and endorses no falls.    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 type 2 diabetes mellitus (CMS-HCC) 12/02/2022    Dry eye syndrome, bilateral 12/21/2020    Dysphagia 02/02/2023    Endometrial cancer (CMS-HCC) 2007    Esophageal varices in cirrhosis (CMS-HCC) 06/17/2022    Fractures 01/29/2018    Gastroparesis     Generalized pruritus 02/02/2023    GERD (gastroesophageal reflux disease) It's  been going on for years    I take medication  which helps tremendously    Glaucoma 2023    I see a doctor on a regular basis    H/O diabetic gastroparesis 01/09/2021    Heart disease     Hemorrhage of rectum and anus 03/17/2011    Hepatic cirrhosis (CMS-HCC) 06/17/2022    Hepatic encephalopathy (CMS-HCC) 06/17/2022    Hepatic steatosis 03/13/2021    Noted on CT Fall 2021    High cholesterol     Hx of psoriasis 08/06/2022    Hyperlipidemia 12/12/2015    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    Liver disease     Major depression     Metabolic dysfunction-associated steatohepatitis (MASH)     Morbid obesity (CMS-HCC) 11/10/2019    Myopia of both eyes with astigmatism and presbyopia 12/02/2022    Nausea 09/22/2022    Non-alcoholic fatty liver disease     NPDR (nonproliferative diabetic retinopathy) (CMS-HCC)     Right eye    Other ascites 09/22/2022    Other chronic pain 11/06/2022    Other insomnia 11/06/2022    PFD (pelvic floor dysfunction) 04/07/2013    Portal hypertension (CMS-HCC) 10/08/2022    Psoriasis     Ptosis of eyelid, left     Left upper eyelid    Reflux     Retinopathy of left eye, background, proliferative 01/09/2021    Right upper quadrant abdominal pain 09/22/2022    Secondary esophageal varices without bleeding (CMS-HCC) 06/17/2022  Type 2 diabetes mellitus, with long-term current use of insulin (CMS-HCC) 12/12/2015    Last Assessment & Plan:   The current medical regimen is effective;  continue present plan and medications.  Patient will continue good management of diabetes especially with weight loss. Hopefully with further weight loss will run into the problem of being overmedicated and having to cut back on medications     Some confusion on diabetes medications was have not.been refilled for over a year.  Pa    Social History     Tobacco Use    Smoking status: Never     Passive exposure: Current    Smokeless tobacco: Never   Substance Use Topics    Alcohol use: Not Currently     Comment: No longer      Past Surgical History:   Procedure Laterality Date    CHOLECYSTECTOMY      HYSTERECTOMY      endometrial cancer    HYSTERECTOMY      OOPHORECTOMY      OTHER SURGICAL HISTORY  Hysterectomy was in 2007. Gallbladder was in 1999 and phrenectomy  was in 2024    Hysterectomy,  gallbladder removed and phrenectomy    PR ANAL PRESSURE RECORD Left 03/31/2013    Procedure: ANORECTAL MANOMETRY;  Surgeon: None None;  [facility]; Service: Gastroenterology    PR BREATH HYDROGEN TEST N/A 03/31/2013    Procedure: BREATH HYDROGEN TEST;  Surgeon: None None;  [facility];  Service: Gastroenterology    PR COLSC FLX W/RMVL OF TUMOR POLYP LESION SNARE TQ N/A 09/06/2020    Procedure: COLONOSCOPY FLEX; W/REMOV TUMOR/LES BY SNARE;  Surgeon: Chriss Driver, MD;  [facility];  Service: Gastroenterology    PR UPPER GI ENDOSCOPY,BIOPSY N/A 04/15/2013    Procedure: UGI ENDOSCOPY; WITH BIOPSY, SINGLE OR MULTIPLE;  Surgeon: Vickii Chafe, MD;  [facility];  Service: Gastroenterology    PR UPPER GI ENDOSCOPY,BIOPSY N/A 09/06/2020    Procedure: UGI ENDOSCOPY; WITH BIOPSY, SINGLE OR MULTIPLE;  Surgeon: Chriss Driver, MD;  [facility];  Service: Gastroenterology    PR UPPER GI ENDOSCOPY,BIOPSY N/A 05/05/2022    Procedure: UGI ENDOSCOPY; WITH BIOPSY, SINGLE OR MULTIPLE;  Surgeon: Chriss Driver, MD;  [facility];  Service: Gastroenterology    PR UPPER GI ENDOSCOPY,BIOPSY N/A 01/22/2023    Procedure: UGI ENDOSCOPY; WITH BIOPSY, SINGLE OR MULTIPLE;  Surgeon: Vonda Antigua, MD;  [facility];  Service: Gastroenterology    Family History   Problem Relation Age of Onset    Diabetes Mother     Cancer Mother         Complications during dialysis    Depression Mother     Hypertension Mother     Vision loss Mother     No Known Problems Father     No Known Problems Brother     Diabetes Maternal Grandmother     Hypertension Maternal Grandmother     Alcohol abuse Maternal Grandmother         Heavy drinker    Arthritis Maternal Grandmother     Cancer Maternal Grandfather     Diabetes Maternal Grandfather     Alcohol abuse Maternal Grandfather     Heart disease Maternal Grandfather     No Known Problems Paternal Grandmother     Skin cancer Paternal Grandfather     Stroke Paternal  Grandfather Vision loss Maternal Aunt         Heart Attack    No Known Problems Maternal Uncle     No Known Problems Paternal Aunt     No Known Problems Paternal Uncle     No Known Problems Other     Melanoma Neg Hx     Basal cell carcinoma Neg Hx     Squamous cell carcinoma Neg Hx     Substance Abuse Disorder Neg Hx     Drug abuse Neg Hx     Mental illness Neg Hx     Amblyopia Neg Hx     Blindness Neg Hx     Cataracts Neg Hx     Glaucoma Neg Hx     Macular degeneration Neg Hx     Retinal detachment Neg Hx     Strabismus Neg Hx     Thyroid disease Neg Hx     Breast cancer Neg Hx         Patient has no known allergies.     Objective Findings  Precautions / Restrictions  Non-applicable       Weight Bearing  Non-applicable    Required Braces or Orthoses  Non-applicable    Communication Preference  Verbal       Pain  Pt reporting 10/10 pain to R shoulder. RN notified and OT repositioned for comfort.    Equipment / Personal assistant    Living Situation  Living Environment: Trailer/Mobile home  Lives With: Other (Cousin)  Home Living: Stairs to enter with rails, Walk-in shower, Standard height toilet, One level home  Rail placement (outside): Bilateral rails in reach  Number of Stairs to Enter (outside): 5  Caregiver Identified?: No  Equipment available at home: Straight cane     Cognition   Orientation Level:  Oriented x 4   Arousal/Alertness:  Appropriate responses to stimuli   Attention Span:  Appears intact   Memory:  Appears intact   Following Commands:  Follows all commands and directions without difficulty   Safety Judgment:  Good awareness of safety precautions   Awareness of Errors and Problem Solving:  Able to problem solve independently    Vision / Hearing   Vision: Wears glasses all the time, Glasses present     Hearing: No deficit identified         Hand Function:  Right Hand Function: Right hand grip strength, ROM and coordination WNL  Left Hand Function: Left hand grip strength, ROM and coordination WNL  Hand Dominance: Right    Skin Inspection:  Skin Inspection: Intact where visualized    Face/Cervical ROM:  Cervical ROM: WFL    ROM / Strength:  UE ROM/ Strength Comment: BUEs strength WFL, though RUE noted slightly decreased strength and AROM in comparison to LUE  LE ROM/Strength: Left WFL, Right WFL    Coordination:  Coordination: Not tested    Sensation:  RUE Sensation: RUE impaired  RUE Sensation Impairment: Numbness, Tingling (RUE more numb/tingly than LUE)  LUE Sensation: LUE impaired  LUE Sensation Impairment: Numbness, Tingling  RLE Sensation: RLE impaired  RLE Sensation Impairment: Numbness  LLE Sensation: LLE impaired  LLE Sensation Impairment: Numbness    Balance:  Static Sitting-Level of Assistance: Independent  Dynamic Sitting-Level of Assistance: Archivist Standing-Level of Assistance: Stand by assistance  Dynamic Standing - Level of Assistance: Stand by assistance    Functional Mobility  Transfers: Standby assist  Bed Mobility - Needs Assistance:  (  NT, Pt seated in recliner upon OT arrival and at end of session)  Ambulation: Pt ambulated ~3ft x2 with CGA/SBA using straight cane.    ADLs  ADLs: Needs assistance with ADLs  ADLs - Needs Assistance: LB dressing, UB dressing, Grooming, Toileting  Grooming - Needs Assistance: Performed standing (handwashing standing at sink-level with SBA)  Toileting - Needs Assistance: Performed seated, Performed standing (toilet transfer with SBA using grab bars and straight cane; toileting tasks deferred this session)  LB Dressing - Needs Assistance: Supervision, Performed seated (doffed/donned socks seated in recliner with supervision)  IADLs: NT    Vitals / Orthostatics  Vitals/Orthostatics: NAD    Patient at end of session: All needs in reach, In chair, Lines intact, Nurse notified     Occupational Therapy Session Duration  OT Individual [mins]: 25       AM-PAC-Daily Activity  Lower Body Dressing assistance needs: A Little - Minimal/Contact Guard Assist/Supervision  Bathing assistance needs: A Little - Minimal/Contact Guard Assist/Supervision  Toileting assistance needs: A Little - Minimal/Contact Guard Assist/Supervision  Upper Body Dressing assistance needs: A Little - Minimal/Contact Guard Assist/Supervision  Personal Grooming assistance needs: A Little - Minimal/Contact Guard Assist/Supervision  Eating Meals assistance needs: None - Modified Independent/Independent    Daily Activity Score: 19    Score (in points): % of Functional Impairment, Limitation, Restriction  6: 100% impaired, limited, restricted  7-8: At least 80%, but less than 100% impaired, limited restricted  9-13: At least 60%, but less than 80% impaired, limited restricted  14-19: At least 40%, but less than 60% impaired, limited restricted  20-22: At least 20%, but less than 40% impaired, limited restricted  23: At least 1%, but less than 20% impaired, limited restricted  24: 0% impaired, limited restricted      I attest that I have reviewed the above information.  Signed: Janae Bridgeman, OT  Filed 11/23/2023

## 2023-11-23 NOTE — Unmapped (Signed)
PHYSICAL THERAPY  Evaluation (11/23/23 0906)          Patient Name:  Ashley Briggs       Medical Record Number: [id number]   Date of Birth: 04-Sep-1966  Sex: Female        Post-Discharge Physical Therapy Recommendations:  PT Post Acute Discharge Recommendations: 3x weekly, Skilled PT services indicated   Equipment Recommendation  PT DME Recommendations: None          Treatment Diagnosis: Abnormalities of gait and mobility, Unsteadiness on feet        Activity Tolerance: Tolerated treatment well     ASSESSMENT  Problem List: Impaired balance, Pain, Gait deviation      Assessment : Ashley Briggs is a 57 y.o. female with a past medical history significant for CAD, decompensated NASH cirrhosis, Type 2 diabetes, HLD, psoriasis on Cosentynx, Anxiety and depression who presents with dizziness and found to have AKI and hyperkalemia. Pt presents to skilled PT services with deficits in balance and endurance, requiring supervision with SPC for ambulation on level surface. OOB mobility limited by pain. Pt would benefit from continued skilled PT services to address safety, education and deficits. After review of contributing co-morbidities and personal factors, clinical presentation and exam findings, patient demonstrates low complexity for evaluation and development of POC. Recommend 3x/wk  post-acute stay.      Today's Interventions: Balance activities, Endurance activities, Therapeutic activity     Personal Factors/Comorbidities Present: 3+   Examination of Body System: Musculoskeletal, Communication, Activity/participation  Clinical Presentation: Stable    Clinical Decision Making: Low        PLAN  Planned Frequency of Treatment: Plan of Care Initiated: 11/23/23  1-2x per day Weekly Frequency: 4-5 days per week        Planned Interventions: Education (Patient/Family/Caregiver), Gait training, Home exercise program, Neuromuscular re-education, Self-care / Home Management training, Therapeutic Exercise, Therapeutic Activity     Goals:   Patient and Family Goals: Reduce Pain     SHORT GOAL #1: Pt will perform all bed mobility independently               Time Frame : 2 weeks  SHORT GOAL #2: Pt will perform all functional transfers independently with LRAD              Time Frame : 2 weeks  SHORT GOAL #3: Pt will ambulate 150' independently with LRAD.              Time Frame : 2 weeks  SHORT GOAL #4: Pt will ascend/descend 5 stairs independently with LRAD                                                 Prognosis:        Barriers to Discharge: Gait instability, Impaired Balance     SUBJECTIVE  Communication Preference: Verbal,     Patient reports: Agreeable to PT  Medical Updates Since Last Visit/Relevant PMH Affecting Clinical Decision Making: Hyperkalemia  Services patient receives prior to admission: PT  Prior Functional Status: Independent with mobility and ADLS; intermittent SPC use. Lives with cousin who has MS and is unable to provide physical assistance.  Equipment available at home: Straight cane        Past Medical History:   Diagnosis Date    Anemia  Angular blepharoconjunctivitis of both eyes 12/21/2020    Anxiety     Arthritis     Ascites     Blepharitis     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    Cataract associated with type 2 diabetes mellitus (CMS-HCC) 12/21/2020    Chronic headaches     Chronic pain     Chronic pain disorder 2029    I have multiple issues. 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 type 2 diabetes mellitus (CMS-HCC) 12/02/2022    Dry eye syndrome, bilateral 12/21/2020    Dysphagia 02/02/2023    Endometrial cancer (CMS-HCC) 2007    Esophageal varices in cirrhosis (CMS-HCC) 06/17/2022 Fractures 01/29/2018    Gastroparesis     Generalized pruritus 02/02/2023    GERD (gastroesophageal reflux disease) It's  been going on for years    I take medication  which helps tremendously    Glaucoma 2023    I see a doctor on a regular basis    H/O diabetic gastroparesis 01/09/2021    Heart disease     Hemorrhage of rectum and anus 03/17/2011    Hepatic cirrhosis (CMS-HCC) 06/17/2022    Hepatic encephalopathy (CMS-HCC) 06/17/2022    Hepatic steatosis 03/13/2021    Noted on CT Fall 2021    High cholesterol     Hx of psoriasis 08/06/2022    Hyperlipidemia 12/12/2015    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    Liver disease     Major depression     Metabolic dysfunction-associated steatohepatitis (MASH)     Morbid obesity (CMS-HCC) 11/10/2019    Myopia of both eyes with astigmatism and presbyopia 12/02/2022    Nausea 09/22/2022    Non-alcoholic fatty liver disease     NPDR (nonproliferative diabetic retinopathy) (CMS-HCC)     Right eye    Other ascites 09/22/2022    Other chronic pain 11/06/2022    Other insomnia 11/06/2022    PFD (pelvic floor dysfunction) 04/07/2013    Portal hypertension (CMS-HCC) 10/08/2022    Psoriasis     Ptosis of eyelid, left     Left upper eyelid    Reflux     Retinopathy of left eye, background, proliferative 01/09/2021    Right upper quadrant abdominal pain 09/22/2022    Secondary esophageal varices without bleeding (CMS-HCC) 06/17/2022    Type 2 diabetes mellitus, with long-term current use of insulin (CMS-HCC) 12/12/2015    Last Assessment & Plan:   The current medical regimen is effective;  continue present plan and medications.  Patient will continue good management of diabetes especially with weight loss. Hopefully with further weight loss will run into the problem of being overmedicated and having to cut back on medications     Some confusion on diabetes medications was have not.been refilled for over a year.  Pa            Social History     Tobacco Use    Smoking status: Never     Passive exposure: Current    Smokeless tobacco: Never   Substance Use Topics    Alcohol use: Not Currently     Comment: No longer  Past Surgical History:   Procedure Laterality Date    CHOLECYSTECTOMY      HYSTERECTOMY      endometrial cancer    HYSTERECTOMY      OOPHORECTOMY      OTHER SURGICAL HISTORY  Hysterectomy was in 2007. Gallbladder was in 1999 and phrenectomy  was in 2024    Hysterectomy,  gallbladder removed and phrenectomy    PR ANAL PRESSURE RECORD Left 03/31/2013    Procedure: ANORECTAL MANOMETRY;  Surgeon: None None;  [facility];  Service: Gastroenterology    PR BREATH HYDROGEN TEST N/A 03/31/2013    Procedure: BREATH HYDROGEN TEST;  Surgeon: None None;  [facility]: [facility];  Service: Gastroenterology    PR COLSC FLX W/RMVL OF TUMOR POLYP LESION SNARE TQ N/A 09/06/2020    Procedure: COLONOSCOPY FLEX; W/REMOV TUMOR/LES BY SNARE;  Surgeon: Chriss Driver, MD;  Location: [facility];  Service: Gastroenterology    PR UPPER GI ENDOSCOPY,BIOPSY N/A 04/15/2013    Procedure: UGI ENDOSCOPY; WITH BIOPSY, SINGLE OR MULTIPLE;  Surgeon: Vickii Chafe, MD;  [facility];  Service: Gastroenterology    PR UPPER GI ENDOSCOPY,BIOPSY N/A 09/06/2020    Procedure: UGI ENDOSCOPY; WITH BIOPSY, SINGLE OR MULTIPLE;  Surgeon: Chriss Driver, MD;  [facility]: [facility];  Service: Gastroenterology    PR UPPER GI ENDOSCOPY,BIOPSY N/A 05/05/2022    Procedure: UGI ENDOSCOPY; WITH BIOPSY, SINGLE OR MULTIPLE;  Surgeon: Chriss Driver, MD;  [facility];  Service: Gastroenterology    PR UPPER GI ENDOSCOPY,BIOPSY N/A 01/22/2023    Procedure: UGI ENDOSCOPY; WITH BIOPSY, SINGLE OR MULTIPLE;  Surgeon: Vonda Antigua, MD;  [facility]: [facility];  Service: Gastroenterology             Family History   Problem Relation Age of Onset    Diabetes Mother Cancer Mother         Complications during dialysis    Depression Mother     Hypertension Mother     Vision loss Mother     No Known Problems Father     No Known Problems Brother     Diabetes Maternal Grandmother     Hypertension Maternal Grandmother     Alcohol abuse Maternal Grandmother         Heavy drinker    Arthritis Maternal Grandmother     Cancer Maternal Grandfather     Diabetes Maternal Grandfather     Alcohol abuse Maternal Grandfather     Heart disease Maternal Grandfather     No Known Problems Paternal Grandmother     Skin cancer Paternal Grandfather     Stroke Paternal Grandfather     Vision loss Maternal Aunt         Heart Attack    No Known Problems Maternal Uncle     No Known Problems Paternal Aunt     No Known Problems Paternal Uncle     No Known Problems Other     Melanoma Neg Hx     Basal cell carcinoma Neg Hx     Squamous cell carcinoma Neg Hx     Substance Abuse Disorder Neg Hx     Drug abuse Neg Hx     Mental illness Neg Hx     Amblyopia Neg Hx     Blindness Neg Hx     Cataracts Neg Hx     Glaucoma Neg Hx  Macular degeneration Neg Hx     Retinal detachment Neg Hx     Strabismus Neg Hx     Thyroid disease Neg Hx     Breast cancer Neg Hx         Allergies: Patient has no known allergies.                  Objective Findings  Precautions / Restrictions  Precautions: Non-applicable  Weight Bearing Status: Non-applicable  Required Braces or Orthoses: Non-applicable     Pain Comments: Abdmonical pain 6/10; lower abdominal pain 8/10; R shoulder pain 10/10     Equipment / Environment: Foley     Vitals/Orthostatics : At rest (supine): 110/63; 75 bpm     Living Situation  Living Environment: Trailer/Mobile home  Lives With: Other (Cousin)  Home Living: Stairs to enter with rails, Walk-in shower, Standard height toilet, One level home  Rail placement (outside): Bilateral rails in reach  Number of Stairs to Enter (outside): 5      Cognition: WFL                 Upper Extremities  UE ROM: Right WFL, Left WFL  UE Strength: Left WFL, Right WFL    Lower Extremities  LE ROM: Right WFL, Left WFL  LE Strength: Left WFL, Right WFL          Motor/Sensory/Neuro Comments: Reported B feet numbness at baseline    Static Sitting-Level of Assistance: Independent  Dynamic Sitting-Level of Assistance: Independent    Static Standing-Level of Assistance: Contact guard  Dynamic Standing - Level of Assistance: Contact guard  Standing Balance comments: static standing CGA without AD; upon standing, BLE support on bed without assistive device      Bed Mobility: Supine to Sit  Supine to Sit assistance level: Standby assist, set-up cues, supervision of patient - no hands on  Bed Mobility comments: Supine<>sit EOB with supervision assist.     Transfers: Sit to Stand  Sit to Stand assistance level: Standby assist, set-up cues, supervision of patient - no hands on  Transfer comments: Sit<>Stand with supervision assist with use of BUE to ascend.      Gait Level of Assistance: Contact guard assist, steadying assist, Standby assist, set-up cues, supervision of patient - no hands on  Gait Assistive Device: Straight Cane, Rolling walker  Gait Distance Ambulated (ft): 100 ft  Skilled Treatment Performed: Pt initially took 3 steps w/o right ear and CGA for stability.  Demonstrated balance impairment,  increased trunk sway and decreased step length; reported increased imbalance due to numbness in B feet; required steadying assist at trunk.  Pt completed 50' gait trial with supervision assist and SPC. Pt demonstrated improved balance vs w/o assistive device; reciprocal steps with improved step length and gait speed. Pt completed 50' gait training with supervision assist and RW. Pt reported she has been using RW so far during hospital stay, but feels as stable with SPC.  Pt demonstrated similar gait as with SPC.     Stairs: Pt performed 5x single leg mini squat per leg with counter support to simulate home entrance; supervision assist. Patient at end of session: All needs in reach, In chair, Lines intact, Nurse notified    Physical Therapy Session Duration  PT Individual [mins]: 21          AM-PAC-6 click  Help currently need turning over In bed?: A Little - Minimal/Contact Guard Assist/Supervision  Help currently needed sitting down/standing up from chair  with arms? : A Little - Minimal/Contact Guard Assist/Supervision  Help currently needed moving from supine to sitting on edge of bed?: A Little - Minimal/Contact Guard Assist/Supervision  Help currently needed moving to and from bed from wheelchair?: A Little - Minimal/Contact Guard Assist/Supervision  Help currently needed walking in a hospital room?: A Little - Minimal/Contact Guard Assist/Supervision  Help currently needed climbing 3-5 steps with railing?: A Little - Minimal/Contact Guard Assist/Supervision    Basic Mobility Score 6 click: 18    6 click Score (in points): % of Functional Impairment, Limitation, Restriction  6: 100% impaired, limited, restricted  7-8: At least 80%, but less than 100% impaired, limited restricted  9-13: At least 60%, but less than 80% impaired, limited restricted  14-19: At least 40%, but less than 60% impaired, limited restricted  20-22: At least 20%, but less than 40% impaired, limited restricted  23: At least 1%, but less than 20% impaired, limited restricted  24: 0% impaired, limited restricted        I attest that I have reviewed the above information.  Signed: Nelly Rout, PT  Filed 11/23/2023

## 2023-11-23 NOTE — Unmapped (Addendum)
Patient Alert and Orientated x 4. I+O cath performed at start of shift for >361mL bladder scan. output. MD ordered q6h bladder scans. Patient reported numbness and tingling of hands and feet. MD aware. IV removed patient reported pain at the site and with flushes. Foley Catheter Placed.   Problem: Adult Inpatient Plan of Care  Goal: Plan of Care Review  Outcome: Ongoing - Unchanged  Flowsheets (Taken 11/22/2023 2335)  Progress: no change  Plan of Care Reviewed With: patient  Goal: Patient-Specific Goal (Individualized)  Outcome: Ongoing - Unchanged  Flowsheets (Taken 11/22/2023 2335)  Anxieties, Fears or Concerns: None voiced  Goal: Absence of Hospital-Acquired Illness or Injury  Outcome: Ongoing - Unchanged  Intervention: Identify and Manage Fall Risk  Recent Flowsheet Documentation  Taken 11/22/2023 2200 by Orlena Sheldon, RN  Safety Interventions:   bed alarm   fall reduction program maintained   low bed   nonskid shoes/slippers when out of bed  Taken 11/22/2023 2000 by Orlena Sheldon, RN  Safety Interventions:   bed alarm   fall reduction program maintained   low bed   nonskid shoes/slippers when out of bed  Goal: Optimal Comfort and Wellbeing  Outcome: Ongoing - Unchanged  Goal: Readiness for Transition of Care  Outcome: Ongoing - Unchanged  Goal: Rounds/Family Conference  Outcome: Ongoing - Unchanged     Problem: Fall Injury Risk  Goal: Absence of Fall and Fall-Related Injury  Outcome: Ongoing - Unchanged  Intervention: Promote Injury-Free Environment  Recent Flowsheet Documentation  Taken 11/22/2023 2200 by Orlena Sheldon, RN  Safety Interventions:   bed alarm   fall reduction program maintained   low bed   nonskid shoes/slippers when out of bed  Taken 11/22/2023 2000 by Orlena Sheldon, RN  Safety Interventions:   bed alarm   fall reduction program maintained   low bed   nonskid shoes/slippers when out of bed     Problem: Self-Care Deficit  Goal: Improved Ability to Complete Activities of Daily Living  Outcome: Ongoing - Unchanged

## 2023-11-23 NOTE — Unmapped (Signed)
Family Medicine Inpatient Service  Progress Note    Team: Family Medicine [place] [facility] (pgr [phone])    Hospital Day: 3    ASSESSMENT / PLAN:   Ashley Briggs is a 57 y.o. female with a past medical history significant for CAD, decompensated NASH cirrhosis, Type 2 diabetes, HLD, psoriasis on Cosentynx, Anxiety and depression who presents with dizziness and found to have AKI and hyperkalemia.     # Hyperkalemia (improved)  K on admission was found to be 7.3. EKG demonstrated sinus arrhythmia. K improved with albuterol, insulin, calcium gluconate, and Lokelma. Per nephrology, likely secondary to her new AKI or medication side effect from home spironolactone. K has considerably improved throughout admission since starting [facility] and holding spironolactone and patient is normokalemic as of 12/22 PM.   - BMP daily  - S/p Lokelma 10g q8h through 12/22. Stopped as K is normalizing after removing offending agents  - Off of telemetry  - Held spironolactone in setting of hyperkalemia through 12/22: resuming at 100 mg daily (half of her home dose) on 12/23  - Nephro recs as follows:              -- If potassium is greater than 6 obtain EKG              -- If EKG changes are present provide calcium gluconate 2 g              -- If potassium is greater than 6.5 provide calcium gluconate 2 g despite EKG              -- If EKG changes are present or potassium is greater than 6.5 provide shifting agents with insulin (5 units) and dextrose              -- If EKG changes are NOT present and potassium is less than 6.5 DO NOT provide shifting agents              -- If insulin is given please watch glucose q2hr for 6 hours              --Also consider administering lokelma each time potassium is greater than 6    # AKI   Cr elevated on admission (2.26) from baseline of 0.75. initially thought to be prerenal due to acute GI loses in the last week or so 20 Bms a day and diuretic use. However. FeUrea consistent w/ intra-renal cause. Some concern Per nephrology, no acute indications for acute dialysis and may require acute dialysis if medical management does not work. To date, AKI has steadily improved though remains above baseline, which does support pre-renal etiology as patient's diarrhea has drastically improved.  - Held Lasix and Spironolactone through 12/22: resuming PO Lasix 40 mg daily and spironolactone 100 mg daily (half of her home doses) on 12/23  - Renal US done, unremarkable  - UA and Urine culture: UA with moderate LE and 30 WBCs but appears contamined with squamous cells, would not treat based on this result. Ucx grew 10-50,000 Klebsiella, which given lack of UTI-like symptoms, will not anticipate treating, especially as repeat urinalysis on 12/23 appeared non-infectious  - Strict I/Os  - Nephrology following. Discussed case with GI, who feel that resuming home diuretics at half dose is appropriate in the setting of her known cirrhosis     # Acute urinary retention  Patient was voiding well on her own until the afternoon of 12/22 while admitted, when she reported that  she felt unable to make urine. Bladder scan showed ~300 ml urine present in bladder. After several hours she remained unable to void and was I/O cath'd with 500 ml output. Foley catheter was placed in the early morning on 12/23. Urinalysis was unremarkable. No other new changes or medications that would likely cause acute urinary retention.  - Trial of void this afternoon, consider additional workup if needed    # MASLD Cirrhosis   # Asterixis  Is being followed by [facility] hepatology outpatient for chronic NASH cirrhosis. In discussions about transplant. Endoscopy in Feb 2024 demonstrated grade 2 esophageal varices and portal hypertensive gastropathy.  No fevers or abdominal tenderness that would suggest SBP. Ascites noted on renal US. MELD-Na score 26. Per pt on lactulose at home, but had not taken in a week or more due to diarrhea -- this has since resolved and lactulose has been presumed.   - Continue pantoprazole 40 mg   - Can consider SBP prophylaxis or diagnostic para if new fever or abdominal tenderness  - Resumed half dose of home Lasix and spironolactone on 12/23  - RUQ US obtained as patient had subacute RUQ pain -- shows cirrhotic liver and small volume RUQ ascites without ductal dilation  - Lactulose QID -- titrating to 2-3 soft Bms a day     # Presyncope (resolved)  Has been having multiple episodes of unsteadiness, dizziness, and pre-syncopal episodes. NO acute weakness or focal symptoms. Neuro exam normal, so Ct head deferred. Orthostats negative. Likely due to fluid losses or possible underlying arrhythmia. Symptoms largely resolved as of 12/22.   - Can bolus as needed      # Metabolic acidosis  Patient has been presenting with several months of diarrhea. VBG on admission demonstrates pH 7.31 with decreased HCO2 (17) and normal anion gap (14). Based on Winter's formula, pCO2 compensation should be 32-26, patient pCO2 33. Likely a NAGMA due to her chronic diarrhea.      # Hypotension  Has had chronic GI losses for the past few months. Blood pressures have been a bit soft while in the ED (90/50s). S/p 1.5 L of fluids.   - Initially held Lasix and Coreg in setting of soft BP. Resumed Coreg for esophageal varices on 12/22 and patient tolerated this without issue     # T2DM  Last A1c was 10.3 on 08/16/2023. Has a GDM at home. Per patient needs to be replaced. Blood glucose largely at goal throughout admission so far.   - Hold metformin in setting of metabolic acidosis   - Insulin regimen: Glargine 36 units nightly (home regimen is 60 units) and Lispro 8 units TID.       # Oral lichen planus  Biopsies from endoscopy were suspicious for lichen planus which is being treated with a mouthwash.      # CAD: Continue aspirin.     # Calcified cerebral meningioma  Known small meningioma. Follows with [facility] neurosurgery and neurology. Has had chronic headaches and dizziness, unclear if related to meningioma. Annual MRIs have demonstrated stable size.      # Plaque psoriasis   - Monthly secukinumab     # Anxiety/ depression   - Continue home mirtazapine 7.5 mg   - Continue home zolpidem 5 mg        # Checklist:  - IVF None  - Daily labs needed: CBC, BMP, and Magnesium  - Diet Regular, consistent carb  - Bowel Regimen: Miralax 17 g daily PRN  -  DVT: SQ Lovenox  - Code Status:   Orders Placed This Encounter   Procedures    Full Code     Standing Status:   Standing     Number of Occurrences:   1     - Dispo: Floor    [ ]  Anticipated Discharge Location: Home with home health  [ ]  PT/OT/DME: Ordered  [ ]  CM/SW needs: None anticipated  [ ]  Follow up appt: Appointment needed    SUBJECTIVE:  Interval events: Acute urinary retention overnight, bladder scan >300 ml PVR. After several hours, I&O cath done for 500 ml UOP. Foley catheter placed, remains in place this AM. Today, she reports otherwise feeling ok. Endorses ongoing bilateral lower extremity pain consistent with her prior neuropathy. Endorses some pain at her PIV site, which has since been removed.       REVIEW OF SYSTEMS:  Pertinent positives and negatives per HPI. A complete review of systems otherwise negative.    PHYSICAL EXAM:      Intake/Output Summary (Last 24 hours) at 11/23/2023 1018  Last data filed at 11/23/2023 0515  Gross per 24 hour   Intake 480 ml   Output 1900 ml   Net -1420 ml       Recent Vitals:  Vitals:    11/23/23 0835   BP: (P) 103/62   Pulse: (P) 76   Resp: (P) 16   Temp: (P) 36.8 ??C (98.2 ??F)   SpO2: (P) 98%       GEN: non-toxic appearing, lying in bed, NAD   HEENT: NCAT, No scleral icterus. Conjunctiva non-erythematous. MMM.  CV: Regular rate and rhythm. No murmurs/rubs/gallops.  Pulm: Normal work of breathing on RA. CTAB. No wheezing, crackles, or rhonchi.  Abd:  Mild RUQ tenderness. No guarding, rebound.  Normoactive bowel sounds.    Neuro: A&O x 3. No focal deficits. Asterixis present  Ext: No peripheral edema.  Palpable distal pulses.  Skin: psoriatic rash over the arms and abdomen      LABS/ STUDIES:    All imaging, laboratory studies, and other pertinent tests including electrocardiography within the last 24 hours were reviewed and are summarized within the assessment and plan.     NUTRITION:                           Enrique Sack, MD  Resident Physician, PGY-1  [facility]

## 2023-11-24 LAB — COMPREHENSIVE METABOLIC PANEL
ALBUMIN: 2.4 g/dL — ABNORMAL LOW (ref 3.4–5.0)
ALKALINE PHOSPHATASE: 348 U/L — ABNORMAL HIGH (ref 46–116)
ALT (SGPT): 18 U/L (ref 10–49)
ANION GAP: 14 mmol/L (ref 5–14)
AST (SGOT): 53 U/L — ABNORMAL HIGH (ref <=34)
BILIRUBIN TOTAL: 3.3 mg/dL — ABNORMAL HIGH (ref 0.3–1.2)
BLOOD UREA NITROGEN: 19 mg/dL (ref 9–23)
BUN / CREAT RATIO: 21
CALCIUM: 10 mg/dL (ref 8.7–10.4)
CHLORIDE: 103 mmol/L (ref 98–107)
CO2: 18.8 mmol/L — ABNORMAL LOW (ref 20.0–31.0)
CREATININE: 0.92 mg/dL (ref 0.55–1.02)
EGFR CKD-EPI (2021) FEMALE: 72 mL/min/{1.73_m2} (ref >=60)
GLUCOSE RANDOM: 153 mg/dL (ref 70–179)
POTASSIUM: 4.5 mmol/L (ref 3.4–4.8)
PROTEIN TOTAL: 8.2 g/dL (ref 5.7–8.2)
SODIUM: 136 mmol/L (ref 135–145)

## 2023-11-24 LAB — CBC
HEMATOCRIT: 31.5 % — ABNORMAL LOW (ref 34.0–44.0)
HEMOGLOBIN: 10 g/dL — ABNORMAL LOW (ref 11.3–14.9)
MEAN CORPUSCULAR HEMOGLOBIN CONC: 31.9 g/dL — ABNORMAL LOW (ref 32.0–36.0)
MEAN CORPUSCULAR HEMOGLOBIN: 23.5 pg — ABNORMAL LOW (ref 25.9–32.4)
MEAN CORPUSCULAR VOLUME: 73.6 fL — ABNORMAL LOW (ref 77.6–95.7)
MEAN PLATELET VOLUME: 8.3 fL (ref 6.8–10.7)
PLATELET COUNT: 152 10*9/L (ref 150–450)
RED BLOOD CELL COUNT: 4.28 10*12/L (ref 3.95–5.13)
RED CELL DISTRIBUTION WIDTH: 20.2 % — ABNORMAL HIGH (ref 12.2–15.2)
WBC ADJUSTED: 4 10*9/L (ref 3.6–11.2)

## 2023-11-24 LAB — HEMOGLOBIN A1C
ESTIMATED AVERAGE GLUCOSE: 180 mg/dL
HEMOGLOBIN A1C: 7.9 % — ABNORMAL HIGH (ref 4.8–5.6)

## 2023-11-24 LAB — MAGNESIUM: MAGNESIUM: 1.9 mg/dL (ref 1.6–2.6)

## 2023-11-24 LAB — PHOSPHORUS: PHOSPHORUS: 2.4 mg/dL (ref 2.4–5.1)

## 2023-11-24 LAB — VITAMIN B12: VITAMIN B-12: 1083 pg/mL — ABNORMAL HIGH (ref 211–911)

## 2023-11-24 MED ORDER — FREESTYLE LIBRE 3 SENSOR DEVICE
3 refills | 0.00 days | Status: CP
Start: 2023-11-24 — End: ?

## 2023-11-24 MED ORDER — LACTULOSE 10 GRAM/15 ML ORAL SOLUTION
Freq: Four times a day (QID) | ORAL | 0 refills | 30.00 days | Status: CP
Start: 2023-11-24 — End: 2023-12-24
  Filled 2023-11-24: 30d supply, fill #0

## 2023-11-24 MED ORDER — GABAPENTIN 300 MG CAPSULE
ORAL_CAPSULE | Freq: Three times a day (TID) | ORAL | 0 refills | 30.00 days | Status: CP
Start: 2023-11-24 — End: 2023-12-24
  Filled 2023-11-24: qty 90, 30d supply, fill #0

## 2023-11-24 MED ORDER — INSULIN GLARGINE (U-100) 100 UNIT/ML (3 ML) SUBCUTANEOUS PEN
Freq: Every evening | SUBCUTANEOUS | 3 refills | 41.00 days | Status: CP
Start: 2023-11-24 — End: 2023-12-24

## 2023-11-24 MED ORDER — INSULIN LISPRO (U-100) 100 UNIT/ML SUBCUTANEOUS PEN
Freq: Three times a day (TID) | SUBCUTANEOUS | 3 refills | 63.00 days | Status: CP
Start: 2023-11-24 — End: 2024-03-23

## 2023-11-24 MED ADMIN — magnesium oxide (MAG-OX) tablet 800 mg: 800 mg | ORAL | @ 02:00:00

## 2023-11-24 MED ADMIN — clotrimazole (MYCELEX) troche 10 mg: 10 mg | ORAL | @ 18:00:00 | Stop: 2023-11-24

## 2023-11-24 MED ADMIN — gabapentin (NEURONTIN) capsule 300 mg: 300 mg | ORAL | @ 18:00:00 | Stop: 2023-11-24

## 2023-11-24 MED ADMIN — gabapentin (NEURONTIN) capsule 300 mg: 300 mg | ORAL | @ 02:00:00

## 2023-11-24 MED ADMIN — buPROPion (Wellbutrin XL) 24 hr tablet 150 mg: 150 mg | ORAL | @ 14:00:00 | Stop: 2023-11-24

## 2023-11-24 MED ADMIN — rifAXIMin (XIFAXAN) tablet 550 mg: 550 mg | ORAL | @ 14:00:00 | Stop: 2023-11-24

## 2023-11-24 MED ADMIN — naltrexone (DEPADE) tablet 50 mg: 50 mg | ORAL | @ 14:00:00 | Stop: 2023-11-24

## 2023-11-24 MED ADMIN — magnesium oxide (MAG-OX) tablet 800 mg: 800 mg | ORAL | @ 14:00:00 | Stop: 2023-11-24

## 2023-11-24 MED ADMIN — pantoprazole (Protonix) EC tablet 40 mg: 40 mg | ORAL | @ 14:00:00 | Stop: 2023-11-24

## 2023-11-24 MED ADMIN — enoxaparin (LOVENOX) syringe 40 mg: 40 mg | SUBCUTANEOUS | @ 11:00:00 | Stop: 2023-11-24

## 2023-11-24 MED ADMIN — furosemide (LASIX) tablet 40 mg: 40 mg | ORAL | @ 14:00:00 | Stop: 2023-11-24

## 2023-11-24 MED ADMIN — gabapentin (NEURONTIN) capsule 300 mg: 300 mg | ORAL | @ 14:00:00 | Stop: 2023-11-24

## 2023-11-24 MED ADMIN — chlorhexidine (PERIDEX) 0.12 % solution 15 mL: 15 mL | OROMUCOSAL | @ 14:00:00 | Stop: 2023-11-24

## 2023-11-24 MED ADMIN — mirtazapine (REMERON) tablet 7.5 mg: 7.5 mg | ORAL | @ 02:00:00

## 2023-11-24 MED ADMIN — zolpidem (AMBIEN) tablet 5 mg: 5 mg | ORAL | @ 02:00:00

## 2023-11-24 MED ADMIN — insulin glargine (LANTUS) injection 36 Units: 36 [IU] | SUBCUTANEOUS | @ 02:00:00

## 2023-11-24 MED ADMIN — clotrimazole (MYCELEX) troche 10 mg: 10 mg | ORAL | @ 04:00:00

## 2023-11-24 MED ADMIN — clotrimazole (MYCELEX) troche 10 mg: 10 mg | ORAL | @ 14:00:00 | Stop: 2023-11-24

## 2023-11-24 MED ADMIN — pantoprazole (Protonix) EC tablet 40 mg: 40 mg | ORAL | @ 02:00:00

## 2023-11-24 MED ADMIN — rifAXIMin (XIFAXAN) tablet 550 mg: 550 mg | ORAL | @ 02:00:00 | Stop: 2023-12-01

## 2023-11-24 MED ADMIN — lactulose oral solution: 20 g | ORAL | @ 11:00:00 | Stop: 2023-11-24

## 2023-11-24 MED ADMIN — pravastatin (PRAVACHOL) tablet 40 mg: 40 mg | ORAL | @ 02:00:00

## 2023-11-24 MED ADMIN — lactulose oral solution: 20 g | ORAL | @ 18:00:00 | Stop: 2023-11-24

## 2023-11-24 MED ADMIN — insulin lispro (HumaLOG) injection 8 Units: 8 [IU] | SUBCUTANEOUS | @ 14:00:00 | Stop: 2023-11-24

## 2023-11-24 MED ADMIN — insulin lispro (HumaLOG) injection 8 Units: 8 [IU] | SUBCUTANEOUS | @ 19:00:00 | Stop: 2023-11-24

## 2023-11-24 MED ADMIN — baclofen (LIORESAL) tablet 10 mg: 10 mg | ORAL | @ 02:00:00

## 2023-11-24 MED ADMIN — spironolactone (ALDACTONE) tablet 100 mg: 100 mg | ORAL | @ 14:00:00 | Stop: 2023-11-24

## 2023-11-24 MED ADMIN — lactulose oral solution: 20 g | ORAL | @ 02:00:00

## 2023-11-24 MED ADMIN — empagliflozin (JARDIANCE) tablet 25 mg: 25 mg | ORAL | @ 14:00:00 | Stop: 2023-11-24

## 2023-11-24 MED ADMIN — cholestyramine (QUESTRAN) 4 gram packet 1 packet: 1 | ORAL | @ 11:00:00 | Stop: 2023-11-24

## 2023-11-24 MED ADMIN — aspirin chewable tablet 81 mg: 81 mg | ORAL | @ 14:00:00 | Stop: 2023-11-24

## 2023-11-24 NOTE — Unmapped (Signed)
Problem: Adult Inpatient Plan of Care  Goal: Plan of Care Review  Outcome: Progressing  Goal: Patient-Specific Goal (Individualized)  Outcome: Progressing  Goal: Absence of Hospital-Acquired Illness or Injury  Outcome: Progressing  Intervention: Identify and Manage Fall Risk  Recent Flowsheet Documentation  Taken 11/24/2023 1000 by Lucendia Herrlich, RN  Safety Interventions:   fall reduction program maintained   assistive device  Taken 11/24/2023 0745 by Lucendia Herrlich, RN  Safety Interventions:   fall reduction program maintained   assistive device  Goal: Optimal Comfort and Wellbeing  Outcome: Progressing  Goal: Readiness for Transition of Care  Outcome: Progressing  Goal: Rounds/Family Conference  Outcome: Progressing     Problem: Fall Injury Risk  Goal: Absence of Fall and Fall-Related Injury  Outcome: Progressing  Intervention: Promote Injury-Free Environment  Recent Flowsheet Documentation  Taken 11/24/2023 1000 by Lucendia Herrlich, RN  Safety Interventions:   fall reduction program maintained   assistive device  Taken 11/24/2023 0745 by Lucendia Herrlich, RN  Safety Interventions:   fall reduction program maintained   assistive device     Problem: Self-Care Deficit  Goal: Improved Ability to Complete Activities of Daily Living  Outcome: Progressing

## 2023-11-24 NOTE — Unmapped (Signed)
Adult Nutrition Assessment Note    Visit Type: RN Consult  Reason for Visit: Per Admission Nutrition Screen (Adult)    NUTRITION INTERVENTIONS and RECOMMENDATION     Weigh weekly      NUTRITION ASSESSMENT     Current nutrition therapy is appropriate and likely meeting nutritional needs at this time.   Patient endorses eating well during admission      NUTRITIONALLY RELEVANT DATA     HPI & PMH:    57 y.o. female with a past medical history significant for chronic liver disease with hepatic encephalopathy, CAD,decompensated NASH cirrhosis, Type 2 diabetes, HLD, psoriasis on Cosentynx, Anxiety and depression who presents with dizziness.     Nutrition History:   12/24- Patient endorses reduced intake over the past 3 months due to nausea ongoing. Patient endorses eating enough during this time but just less than usual.    Medications:  Nutritionally pertinent medications reviewed and evaluated for potential food and/or medication interactions.  Jardiance, Mag-ox, Lantus, Humalog, Remeron, Aldactone    Labs:   Nutritionally pertinent labs reviewed.     Nutritional Needs:   Healthy balance of carbohydrate, protein, and fat.     Anthropometric Data:  Height: 162.6 cm (5' 4)   Admission weight: 92.1 kg (203 lb)  Last recorded weight: 93.1 kg (205 lb 3.2 oz)  Date of last recorded weight: 12/21  IBW: 54.49 kg  BMI: Body mass index is 35.22 kg/m??.   Usual Body Weight:  210 lb  Weight Assessment: Stable weight noted  Wt Readings from Last 25 Encounters:   11/21/23 93.1 kg (205 lb 3.2 oz)   11/06/23 94.3 kg (207 lb 12.8 oz)   11/05/23 96.2 kg (212 lb)   11/02/23 96.2 kg (212 lb)   10/26/23 95.8 kg (211 lb 1.6 oz)   10/16/23 97.7 kg (215 lb 6.2 oz)   10/12/23 93.9 kg (207 lb)   10/07/23 96.2 kg (212 lb)   09/30/23 (!) 103.8 kg (228 lb 14.4 oz)   09/04/23 (!) 103.7 kg (228 lb 9.6 oz)   08/31/23 (!) 103.6 kg (228 lb 6.4 oz)   08/26/23 100.2 kg (221 lb)   08/18/23 91.6 kg (202 lb)   07/29/23 92.5 kg (204 lb)   06/08/23 91.9 kg (202 lb 8 oz)   06/05/23 95.7 kg (210 lb 14.4 oz)   06/02/23 95.5 kg (210 lb 9 oz)   05/08/23 91.8 kg (202 lb 6.4 oz)   04/19/23 98.2 kg (216 lb 6.4 oz)   03/24/23 91.6 kg (202 lb)   03/06/23 94 kg (207 lb 4.8 oz)   02/27/23 93.5 kg (206 lb 1.6 oz)   02/11/23 92.9 kg (204 lb 12.8 oz)   01/27/23 97.2 kg (214 lb 3.2 oz)   01/22/23 96.2 kg (212 lb)       Malnutrition Assessment:  Malnutrition Assessment using AND/ASPEN or GLIM Clinical Characteristics:    Patient does not meet AND/ASPEN criteria for malnutrition at this time (11/24/23 1325)               Nutrition Focused Physical Exam:  Nutrition Focused Physical Exam:  Fat Areas Examined  Upper Arm: No loss      Muscle Areas Examined  Temple: No loss  Clavicle: No loss  Acromion: No loss  Patellar: No loss  Anterior Thigh: No loss  Posterior Calf: No loss              Nutrition Evaluation  Overall Impressions: No fat loss, No  muscle loss (11/24/23 1325)     Care plan:  Not completed, patient does not meet malnutrition criteria     Current Nutrition:  Oral intake   Nutrition Orders            Nutrition Therapy Consistent Carb; Consistent Carb 45/45/45 (3/3/3) starting at 12/23 0835            Nutritionally Pertinent Allergies, Intolerances, Sensitivities, and/or Cultural/Religious Restrictions:  none identified per chart review at this time     GOALS and EVALUATION     Patient to meet 75% or greater of nutritional needs via combination of meals, snacks, and/or oral supplements within admission.  - Meeting/Ongoing    Motivation, Barriers, and Compliance:  Evaluation of motivation, barriers, and compliance completed. No concerns identified at this time.     Discharge Planning:   Monitor for potential discharge needs with multi-disciplinary team.         Follow-Up Parameters:   1-2 times per week (and more frequent as indicated)    Alesia Morin, RD, LDN

## 2023-11-24 NOTE — Unmapped (Signed)
Mr timpson is alert and oriented she did well with her TOV today after her foley was removed she urinated with a pvr of 12ml.  MD made aware.  Vitals within normal limits.    Livia Snellen, RN      Problem: Adult Inpatient Plan of Care  Goal: Absence of Hospital-Acquired Illness or Injury  Outcome: Progressing  Intervention: Identify and Manage Fall Risk  Recent Flowsheet Documentation  Taken 11/23/2023 1800 by Livia Snellen, RN  Safety Interventions: fall reduction program maintained  Taken 11/23/2023 1600 by Livia Snellen, RN  Safety Interventions: fall reduction program maintained  Taken 11/23/2023 1400 by Livia Snellen, RN  Safety Interventions: fall reduction program maintained  Taken 11/23/2023 1200 by Livia Snellen, RN  Safety Interventions:   fall reduction program maintained   low bed  Taken 11/23/2023 0800 by Livia Snellen, RN  Safety Interventions: fall reduction program maintained  Goal: Readiness for Transition of Care  Outcome: Progressing     Problem: Fall Injury Risk  Goal: Absence of Fall and Fall-Related Injury  Intervention: Promote Injury-Free Environment  Recent Flowsheet Documentation  Taken 11/23/2023 1800 by Livia Snellen, RN  Safety Interventions: fall reduction program maintained  Taken 11/23/2023 1600 by Livia Snellen, RN  Safety Interventions: fall reduction program maintained  Taken 11/23/2023 1400 by Livia Snellen, RN  Safety Interventions: fall reduction program maintained  Taken 11/23/2023 1200 by Livia Snellen, RN  Safety Interventions:   fall reduction program maintained   low bed  Taken 11/23/2023 0800 by Livia Snellen, RN  Safety Interventions: fall reduction program maintained

## 2023-11-24 NOTE — Unmapped (Signed)
Pt remained stable overnight, possible discharge today, medicated as ordered, falls precautions maintained, pt is able to make needs known, no concerns at this time, call bell is within reach, will continue to monitor  Problem: Adult Inpatient Plan of Care  Goal: Plan of Care Review  Outcome: Ongoing - Unchanged  Goal: Patient-Specific Goal (Individualized)  Outcome: Ongoing - Unchanged  Goal: Absence of Hospital-Acquired Illness or Injury  Outcome: Ongoing - Unchanged  Intervention: Identify and Manage Fall Risk  Recent Flowsheet Documentation  Taken 11/24/2023 0400 by Oletta Cohn, RN  Safety Interventions:   fall reduction program maintained   lighting adjusted for tasks/safety   low bed  Taken 11/24/2023 0200 by Oletta Cohn, RN  Safety Interventions:   fall reduction program maintained   lighting adjusted for tasks/safety   low bed  Taken 11/24/2023 0000 by Oletta Cohn, RN  Safety Interventions:   fall reduction program maintained   lighting adjusted for tasks/safety   low bed  Taken 11/23/2023 2200 by Oletta Cohn, RN  Safety Interventions:   fall reduction program maintained   lighting adjusted for tasks/safety   low bed  Taken 11/23/2023 2000 by Oletta Cohn, RN  Safety Interventions:   fall reduction program maintained   lighting adjusted for tasks/safety   low bed  Goal: Optimal Comfort and Wellbeing  Outcome: Ongoing - Unchanged  Goal: Readiness for Transition of Care  Outcome: Ongoing - Unchanged  Goal: Rounds/Family Conference  Outcome: Ongoing - Unchanged     Problem: Fall Injury Risk  Goal: Absence of Fall and Fall-Related Injury  Outcome: Ongoing - Unchanged  Intervention: Promote Injury-Free Environment  Recent Flowsheet Documentation  Taken 11/24/2023 0400 by Oletta Cohn, RN  Safety Interventions:   fall reduction program maintained   lighting adjusted for tasks/safety   low bed  Taken 11/24/2023 0200 by Oletta Cohn, RN  Safety Interventions:   fall reduction program maintained   lighting adjusted for tasks/safety   low bed  Taken 11/24/2023 0000 by Oletta Cohn, RN  Safety Interventions:   fall reduction program maintained   lighting adjusted for tasks/safety   low bed  Taken 11/23/2023 2200 by Oletta Cohn, RN  Safety Interventions:   fall reduction program maintained   lighting adjusted for tasks/safety   low bed  Taken 11/23/2023 2000 by Oletta Cohn, RN  Safety Interventions:   fall reduction program maintained   lighting adjusted for tasks/safety   low bed

## 2023-11-24 NOTE — Unmapped (Signed)
Care Management  Initial Transition Planning Assessment                Patient generally independent at baseline with use of a cane in one story home with five steps to enter. No preference of home health provider and somewhat reluctant to accept services.     General  Care Manager assessed the patient by : In person interview with patient, Discussion with Clinical Care team, Medical record review  Orientation Level: Oriented X4  Functional level prior to admission: Independent  Reason for referral: Discharge Planning, Home Health    Contact/Decision Maker  Extended Emergency Contact Information  Primary Emergency Contact: Ashley Briggs,Ashley Briggs  Mobile Phone: [phone]  Relation: Relative    Legal Next of Kin / Guardian / POA / Advance Directives     HCDM (patient stated preference): Ashley Briggs,Ashley Briggs - Relative - [phone]    Advance Directive (Medical Treatment)  Does patient have an advance directive covering medical treatment?: Patient does not have advance directive covering medical treatment.    Health Care Decision Maker [HCDM] (Medical & Mental Health Treatment)  Healthcare Decision Maker: HCDM documented in the HCDM/Contact Info section.  Information offered on HCDM, Medical & Mental Health advance directives:: Patient given information.         Readmission Information    Have you been hospitalized in the last 30 days?: No       Did the following happen with your discharge?      Patient Information  Lives with: Family members    Type of Residence: Private residence             Support Systems/Concerns: Family Members    Responsibilities/Dependents at home?: No    Home Care services in place prior to admission?: No                  Equipment Currently Used at Home: cane, straight       Currently receiving outpatient dialysis?: No       Financial Information               Social Determinants of Health  Social Drivers of Health     Food Insecurity: No Food Insecurity (08/18/2023)    Hunger Vital Sign     Worried About Running Out of Food in the Last Year: Never true     Ran Out of Food in the Last Year: Never true   Internet Connectivity: No Internet connectivity concern identified (09/17/2022)    Internet Connectivity     Do you have access to internet services: Yes     How do you connect to the internet: Personal Device at home     Is your internet connection strong enough for you to watch video on your device without major problems?: Yes     Do you have enough data to get through the month?: Yes     Does at least one of the devices have a camera that you can use for video chat?: Yes   Housing/Utilities: Low Risk  (08/18/2023)    Housing/Utilities     Within the past 12 months, have you ever stayed: outside, in a car, in a tent, in an overnight shelter, or temporarily in someone else's home (i.e. couch-surfing)?: No     Are you worried about losing your housing?: No     Within the past 12 months, have you been unable to get utilities (heat, electricity) when it was really needed?: No   Tobacco Use:  Medium Risk (11/23/2023)    Patient History     Smoking Tobacco Use: Never     Smokeless Tobacco Use: Never     Passive Exposure: Current   Transportation Needs: No Transportation Needs (08/18/2023)    PRAPARE - Transportation     Lack of Transportation (Medical): No     Lack of Transportation (Non-Medical): No   Alcohol Use: Not At Risk (01/09/2021)    Alcohol Use     How often do you have a drink containing alcohol?: Monthly or less     How many drinks containing alcohol do you have on a typical day when you are drinking?: 1 - 2     How often do you have 5 or more drinks on one occasion?: Never   Interpersonal Safety: Not At Risk (09/17/2022)    Interpersonal Safety     Unsafe Where You Currently Live: No     Physically Hurt by Anyone: No     Abused by Anyone: No   Physical Activity: Insufficiently Active (01/09/2021)    Exercise Vital Sign     Days of Exercise per Week: 1 day     Minutes of Exercise per Session: 40 min   Intimate Partner Violence: Not At Risk (01/09/2021)    Humiliation, Afraid, Rape, and Kick questionnaire     Fear of Current or Ex-Partner: No     Emotionally Abused: No     Physically Abused: No     Sexually Abused: No   Stress: Stress Concern Present (09/17/2022)    [employer] of Occupational Health - Occupational Stress Questionnaire     Feeling of Stress : Very much   Substance Use: Not on file (10/06/2023)   Social Connections: Moderately Isolated (09/02/2018)    Received from [facility]    Social Connection and Isolation Panel [NHANES]     Frequency of Communication with Friends and Family: Once a week     Frequency of Social Gatherings with Friends and Family: Twice a week     Attends Religious Services: Never     Database administrator or Organizations: No     Attends Banker Meetings: Never     Marital Status: Widowed   Physicist, medical Strain: Low Risk  (08/18/2023)    Overall Financial Resource Strain (CARDIA)     Difficulty of Paying Living Expenses: Not very hard   Depression: Not at risk (07/12/2021)    PHQ-2     PHQ-2 Score: 0   Health Literacy: Low Risk  (01/09/2021)    Health Literacy     : Never       Complex Discharge Information    Is patient identified as a difficult/complex discharge?: No                                                               Interventions:       Discharge Needs Assessment  Concerns to be Addressed: discharge planning    Clinical Risk Factors:                                    Discharge Facility/Level of Care Needs:      Readmission  Risk of Unplanned Readmission Score: UNPLANNED READMISSION SCORE: 29.6%  Predictive Model Details          30% (High)  Factor Value    Calculated 11/24/2023 08:04 24% Number of active inpatient medication orders 46    Fletcher Risk of Unplanned Readmission Model 17% Number of ED visits in last six months 4     7% Number of hospitalizations in last year 2     7% ECG/EKG order present in last 6 months     6% Charlson Comorbidity Index 7     6% Latest BUN high (24 mg/dL)     6% Diagnosis of electrolyte disorder present     5% Imaging order present in last 6 months     5% Latest hemoglobin low (10.0 g/dL)     5% Phosphorous result present     4% Age 22     3% Active anticoagulant inpatient medication order present     3% Latest creatinine high (1.07 mg/dL)     1% Future appointment scheduled     1% Current length of stay 1.747 days     1% Active ulcer inpatient medication order present      Readmitted Within the Last 30 Days? (No if blank)        Discharge Plan  Screen findings are: Discharge planning needs identified or anticipated (Comment).    Expected Discharge Date: 11/24/2023    Expected Transfer from Critical Care:      Quality data for continuing care services shared with patient and/or representative?: N/A  Patient and/or family were provided with choice of facilities / services that are available and appropriate to meet post hospital care needs?: N/A       Initial Assessment complete?: Yes

## 2023-11-24 NOTE — Unmapped (Signed)
Physician Discharge Summary HBR  1 BT2 [facility]  [address]  [place] [address]  Dept: [phone]  Loc: [phone]     Identifying Information:   Ashley Briggs  [birth year]  [id number]    Primary Care Physician: Karie Georges Pap, DO   Code Status: Full Code    Admit Date: 11/20/2023    Discharge Date: 11/24/2023     Discharge To: Home    Discharge Service: [facility]BR - FAM [place] Si     Discharge Attending Physician: Soyla Murphy, MD    Discharge Diagnoses:  Principal Problem:    Hyperkalemia  Active Problems:    Type 2 diabetes mellitus, with long-term current use of insulin (CMS-HCC)    Hepatic cirrhosis (CMS-HCC)    AKI (acute kidney injury) (CMS-HCC)    Pre-syncope      Outpatient Provider Follow Up Issues:   [ ]  Please check BMP to ensure AKI and hyperkalemia remain resolved with resumption of home Lasix and spironolactone  [ ]  Assess adequacy of current insulin regimen. Patient had diminished PO intake and blood sugars were low-normal while admitted, so her discharge regimen has been decreased to 36 units Lantus nightly, 8 units lispro with meals    Hospital Course:   Ashley Briggs is a 57 y/o F with history of CAD, decompensated NASH cirrhosis with hepatic encephalopathy, Type 2 diabetes, HLD, psoriasis on Cosentynx, anxiety and depression who is admitted for dizziness and confusion, ultimately found to have profound hyperkalemia and AKI.     Hyperkalemia  On initial presentation, patient was noted to be hyperkalemic to 7.3. EKG showed sinus arrhythmia. Patient was treated with albuterol, insulin, calcium gluconate and Lokelma with improvement in K.  Case was discussed with nephrology who felt that hyperkalemic was likely due to new AKI vs use of spironolactone at home. Spironolactone was held and patient was started on Lokelma 10 mg TID. 36 hours following admission, patient's K improved to 5.1 and Lokelma was stopped. Her labs continued to normalize and her home Lasix and spironolactone were slowly resumed. Of note, previous diarrhea at home resolved as well and she was restarted on her home lactulose, now QID to achieve adequate bowel movements. On the day of discharge she was normokalemic on spironolactone.     AKI, likely pre-renal  On admission, patient was noted to have an AKI with Cr 2.26 (baseline 0.8). Of note, patient had been having ~20 bowel movements daily prior to admission and taking diuretics for cirrhosis, raising suspicion for a prerenal etiology, although FEUrea was more suggestive of intra-renal pathology. Despite this, two days following hospital admission, Cr improved to 1.4 with the patient making excellent urine output, thus making a pre-renal etiology more likely. Cr steadily improving towards baseline at discharge.     Brief acute urinary retention (resolved)  On 12/23, patient felt unable to void, and required a Foley catheter with good output. She was not on anticholinergic medications and urine appeared non-infectious. The following day, she passed a trial of void without specific intervention, and continued to void well on her own prior to discharge.     Decompensated cirrhosis  Patient has MASLD cirrhosis followed by [facility], complicated by esophageal varices and portal hypertensive gastropathy. Suspect that some element of confusion and asterixis on initial presentation was secondary to decompensated cirrhosis. Patient had not been taking her home lactulose due to diarrhea. This was resumed inpatient. While admitted, her spironolactone and Lasix were held given AKI and hyperkalemia, but these  were able to be resumed leading up to/on discharge.     T2DM  Patient is on a home insulin regimen of Lantus 50 units nightly and lispro 10 units with meals. Given poor PO intake, report of hypoglycemia at home, and some low-normal blood sugars while admitted, insulin dosing was decreased. Sugars were at goal on Lantus 36 units nightly and lispro 8 units with meals. She is being discharged on this regimen. Repeat HbA1C while admitted was improved from prior, 7.9.       Touchbase with Outpatient Provider:  Warm Handoff: Completed on 11/24/23 by Vickki Muff, MD  (Intern) via [facility] Message    Procedures:  No admission procedures for hospital encounter.  ______________________________________________________________________  Discharge Medications:     Your Medication List        STOP taking these medications      amoxicillin-clavulanate 875-125 mg per tablet  Commonly known as: AUGMENTIN     cholestyramine-aspartame 4 gram Pwpk  Commonly known as: cholestyramine light  Replaced by: cholestyramine 4 gram packet     clotrimazole 10 mg troche  Commonly known as: MYCELEX            START taking these medications      cholestyramine 4 gram packet  Commonly known as: QUESTRAN  Take 1 packet by mouth daily.  Start taking on: November 25, 2023  Replaces: cholestyramine-aspartame 4 gram Pwpk            CHANGE how you take these medications      DEXCOM G7 SENSOR Devi  Generic drug: blood-glucose sensor  Use to monitor blood glucose levels continuously. Change sensor every 10 days.  What changed: Another medication with the same name was added. Make sure you understand how and when to take each.     FREESTYLE LIBRE 3 SENSOR  Generic drug: blood-glucose sensor  Use 1 sensor every 15 days  What changed: You were already taking a medication with the same name, and this prescription was added. Make sure you understand how and when to take each.     insulin glargine 100 unit/mL (3 mL) injection pen  Commonly known as: BASAGLAR, LANTUS  Inject 0.36 mL (36 Units total) under the skin nightly.  What changed: how much to take     insulin lispro 100 unit/mL injection pen  Commonly known as: HumaLOG  Inject 8 Units under the skin Three (3) times a day before meals.  What changed: how much to take     lactulose 10 gram/15 mL solution  Take 30 mL (20 g total) by mouth four (4) times a day.  What changed: when to take this            CONTINUE taking these medications      ACCU-CHEK GUIDE TEST STRIPS Strp  Generic drug: blood sugar diagnostic  Test two (2) times a day (30 minutes before a meal).     ACCU-CHEK SOFTCLIX LANCETS lancets  Generic drug: lancets  Use as directed to test once daily.     acetaminophen 325 MG tablet  Commonly known as: TYLENOL  Take 2 tablets (650 mg total) by mouth two (2) times a day as needed for pain.     aspirin 81 MG tablet  Commonly known as: ECOTRIN  Take 1 tablet (81 mg total) by mouth daily.     baclofen 5 mg Tab tablet  Commonly known as: LIORESAL  Increase to 10mg  nightly.     buPROPion 150 MG  24 hr tablet  Commonly known as: Wellbutrin XL  Take 1 tablet (150 mg total) by mouth daily.     canagliflozin 300 mg Tab tablet  Commonly known as: INVOKANA  Take 1 tablet (300 mg total) by mouth daily before breakfast.     carvedilol 6.25 MG tablet  Commonly known as: COREG  Take 1 tablet (6.25 mg total) by mouth two (2) times a day.     chlorhexidine 0.12 % solution  Commonly known as: PERIDEX  15 mL by Mouth route daily.     COSENTYX PEN (2 PENS) 150 mg/mL Pnij injection  Generic drug: secukinumab  Inject the contents of 2 pens (300 mg total) under the skin every twenty-eight (28) days. Maintenance dose     COSENTYX PEN (2 PENS) 150 mg/mL Pnij injection  Generic drug: secukinumab  Inject the contents of 2 pens (300 mg total) under the skin once a week at weeks 0, 1, 2, 3, and 4. THEN inject the contents of 2 pens (300 mg total) every 4 weeks thereafter.     DEXCOM G7 RECEIVER Misc  Generic drug: blood-glucose meter,continuous  Use as instructed with G7 sensors.     empty container Misc  Use as directed to dispose of Cosentyx pens.     furosemide 40 MG tablet  Commonly known as: LASIX  Take 1 tablet (40 mg total) by mouth two (2) times a day.     gabapentin 300 MG capsule  Commonly known as: NEURONTIN  Take 1 capsule (300 mg total) by mouth Three (3) times a day.     LIDOCAINE PAIN RELIEF 4 % patch  Generic drug: lidocaine  Place 1 patch on the skin daily. Apply for 12 hours then remove for 12 hours.     magnesium oxide 400 mg (241.3 mg elemental) tablet  Commonly known as: MAG-OX  Take 2 tablets (800 mg total) by mouth two (2) times a day.     metFORMIN 500 MG 24 hr tablet  Commonly known as: GLUCOPHAGE-XR  Take 1 tablet (500 mg total) by mouth Two (2) times a day (30 minutes before a meal).     mirtazapine 7.5 MG tablet  Commonly known as: REMERON  Take 1 tablet (7.5 mg total) by mouth.     naltrexone 50 mg tablet  Commonly known as: DEPADE  Take 1 tablet (50 mg total) by mouth daily. Start with half a table (25 mg) a day for 2 weeks before increasing to a full tablet (50 mg) a day     ondansetron 4 MG disintegrating tablet  Commonly known as: ZOFRAN-ODT  Dissolve 1 tablet (4 mg total) in the mouth every twelve (12) hours as needed for nausea.     pantoprazole 40 MG tablet  Commonly known as: Protonix  Take 1 tablet (40 mg total) by mouth two (2) times a day.     pen needle, diabetic 32 gauge x 1/4 (6 mm) Ndle  Commonly known as: ULTICARE PEN NEEDLE  Inject 1 each under the skin nightly.     promethazine 12.5 MG tablet  Commonly known as: PHENERGAN  Take 1 tablet (12.5 mg total) by mouth every eight (8) hours as needed for nausea.     rifAXIMin 550 mg Tab  Commonly known as: XIFAXAN  Take 1 tablet (550 mg total) by mouth two (2) times a day.     simvastatin 20 MG tablet  Commonly known as: ZOCOR  Take 1 tablet (20 mg total) by  mouth every evening.     SLOW FE 142 mg (45 mg iron) Tber  Generic drug: ferrous sulfate  Take 1 tablet (142 mg) by mouth two (2) times a day.     spironolactone 100 MG tablet  Commonly known as: ALDACTONE  Take 1 tablet (100 mg total) by mouth two (2) times a day.     zolpidem 5 MG tablet  Commonly known as: AMBIEN  Take 1 tablet (5 mg total) by mouth nightly as needed.              Allergies:  Patient has no known allergies.  ______________________________________________________________________  Pending Test Results (if blank, then none):      Most Recent Labs:  All lab results last 24 hours -   Recent Results (from the past 24 hours)   POCT Glucose    Collection Time: 11/23/23  7:57 PM   Result Value Ref Range    Glucose, POC 135 70 - 179 mg/dL   Basic metabolic panel    Collection Time: 11/23/23  8:14 PM   Result Value Ref Range    Sodium 137 135 - 145 mmol/L    Potassium 4.8 3.4 - 4.8 mmol/L    Chloride 104 98 - 107 mmol/L    CO2 20.4 20.0 - 31.0 mmol/L    Anion Gap 13 5 - 14 mmol/L    BUN 24 (H) 9 - 23 mg/dL    Creatinine 2.95 (H) 0.55 - 1.02 mg/dL    BUN/Creatinine Ratio 22     eGFR CKD-EPI (2021) Female 60 >=60 mL/min/1.62m2    Glucose 140 70 - 179 mg/dL    Calcium 9.4 8.7 - 28.4 mg/dL   Comprehensive Metabolic Panel    Collection Time: 11/24/23  7:15 AM   Result Value Ref Range    Sodium 136 135 - 145 mmol/L    Potassium 4.5 3.4 - 4.8 mmol/L    Chloride 103 98 - 107 mmol/L    CO2 18.8 (L) 20.0 - 31.0 mmol/L    Anion Gap 14 5 - 14 mmol/L    BUN 19 9 - 23 mg/dL    Creatinine 1.32 4.40 - 1.02 mg/dL    BUN/Creatinine Ratio 21     eGFR CKD-EPI (2021) Female 72 >=60 mL/min/1.98m2    Glucose 153 70 - 179 mg/dL    Calcium 10.2 8.7 - 72.5 mg/dL    Albumin 2.4 (L) 3.4 - 5.0 g/dL    Total Protein 8.2 5.7 - 8.2 g/dL    Total Bilirubin 3.3 (H) 0.3 - 1.2 mg/dL    AST 53 (H) <=36 U/L    ALT 18 10 - 49 U/L    Alkaline Phosphatase 348 (H) 46 - 116 U/L   CBC    Collection Time: 11/24/23  7:15 AM   Result Value Ref Range    WBC 4.0 3.6 - 11.2 10*9/L    RBC 4.28 3.95 - 5.13 10*12/L    HGB 10.0 (L) 11.3 - 14.9 g/dL    HCT 64.4 (L) 03.4 - 44.0 %    MCV 73.6 (L) 77.6 - 95.7 fL    MCH 23.5 (L) 25.9 - 32.4 pg    MCHC 31.9 (L) 32.0 - 36.0 g/dL    RDW 74.2 (H) 59.5 - 15.2 %    MPV 8.3 6.8 - 10.7 fL    Platelet 152 150 - 450 10*9/L   Magnesium Level    Collection Time: 11/24/23  7:15 AM   Result Value  Ref Range    Magnesium 1.9 1.6 - 2.6 mg/dL Phosphorus Level    Collection Time: 11/24/23  7:15 AM   Result Value Ref Range    Phosphorus 2.4 2.4 - 5.1 mg/dL   Hemoglobin F6O    Collection Time: 11/24/23  7:15 AM   Result Value Ref Range    Hemoglobin A1C 7.9 (H) 4.8 - 5.6 %    Estimated Average Glucose 180 mg/dL   POCT Glucose    Collection Time: 11/24/23  8:03 AM   Result Value Ref Range    Glucose, POC 137 70 - 179 mg/dL   Vitamin Z30 Level    Collection Time: 11/24/23 11:03 AM   Result Value Ref Range    Vitamin B-12 1,083 (H) 211 - 911 pg/ml   POCT Glucose    Collection Time: 11/24/23  1:35 PM   Result Value Ref Range    Glucose, POC 159 70 - 179 mg/dL       Relevant Studies/Radiology (if blank, then none):  US Abdomen Limited  Result Date: 11/23/2023  EXAM: US ABDOMEN LIMITED ACCESSION: [id number] UN REPORT DATE: 11/22/2023 10:21 PM     CLINICAL INDICATION: 57 years old with cirhosis, subacute RUQ discomfort      COMPARISON: Renal ultrasound 11/21/2023, abdominal ultrasound 09/16/2023     TECHNIQUE: Static and cine images of the right upper quadrant were performed.     FINDINGS:     LIVER: The liver was heterogeneous in echogenicity with nodular contour, limiting evaluation for focal lesions. No focal liver lesions identified. No biliary ductal dilatation.      Liver: 15.9 cm      Common bile duct: .49 cm     GALLBLADDER: The gallbladder is surgically absent.     LIMITED RIGHT KIDNEY: No hydronephrosis.     OTHER: Small volume right upper quadrant ascites, seen on prior MRI.         --Heterogeneous, nodular liver, likely representing cirrhosis. --Small volume right upper quadrant ascites, seen on prior. --No biliary ductal dilatation.              ______________________________________________________________________  Discharge Instructions:     You were admitted to [facility] Medicine at [facility] for high potassium and worsening renal function. This likely occurred due to dehydration with your diarrhea at home. With resolution of diarrhea, your labs have drastically improved and are once again normal.      Please carefully read and follow these instructions below upon your discharge:     1) Please take your medications as prescribed and note the changes listed on your discharge. At future follow-up appointments, please be sure to take all of your medications with you so your provider can better guide your care.      2) Seek medical care with your primary care doctor or local Emergency Room or Urgent Care if you develop any changes in your mental status, worsening abdominal pain, fevers greater than 101.5, any unexplained/unrelieved shortness of breath, uncontrolled nausea and vomiting that keeps you from remaining hydrated or taking your medication, or any other concerning symptoms.      3) Please go to your follow-up appointments. Some of your follow-up appointments have been listed below. If you do not see an appointment listed below with your primary care doctor, please call your doctor's office as soon as possible to schedule an appointment to be seen within 7-10 days of discharge.  Appointments which have been scheduled for you      Dec 03, 2023 8:00 AM  (Arrive by 7:45 AM)  NEW PODITARY with Jearl Klinefelter, DPM  [facility] HEART VASCULAR CTR PODIATRY [place] [facility] REGION) [address]  [address]  [place] [address]  [phone]        Dec 07, 2023 2:40 PM  (Arrive by 2:25 PM)  RETURN CONTINUITY with Benison Pap Rosine Beat, DO  [facility] PRIMARY CARE [address] AT [place] [facility] REGION) [address] [place] [address]  [phone]        Dec 18, 2023 12:30 PM  (Arrive by 12:15 PM)  RETURN NEUROLOGY with Arizona Constable, ANP  [facility] NEUROLOGY CLINIC [facility] REGION) [address]  [address]  [place] [address]  [phone]        Dec 22, 2023 10:30 AM  (Arrive by 10:00 AM)  RETURN  HEPATOLOGY with Kennedy Bucker, MD  [facility] LIVER TRANSPLANT [facility] REGION) [address]  [place] [address]  [phone]        Dec 24, 2023 10:00 AM  (Arrive by 9:45 AM)  RETURN CONTINUITY with Benison Pap Rosine Beat, DO  [facility] PRIMARY CARE [address] AT [facility] REGION) [address] [place] [address]  [phone]        Dec 28, 2023 1:30 PM  (Arrive by 1:00 PM)  FL BARIUM SWALLOW MODIFIED with Onnie Boer RM 7  IMG [facility]) [address]  [place] [address]  [phone]   Preparing for the appointment:  There are a few things you need to know before coming to the hospital:  Please bring any recent lab work from outside facilities.  Most medications that you would normally take should still be taken as scheduled the day of your  imaging appointment/procedure. Do not stop taking any medications without talking to a  physician first.  If you are diabetic, please check with your physician regarding your diabetic medications for the  day of procedure since you will not be eating prior to procedure.  If you wear an appliance/ostomy bag, please bring an additional bag in case a change is  necessary.  Please let us know if you:  Are pregnant, suspect you might be pregnant or are breast feeding  Are allergic to iodine or other contrast dyes used in imaging exams  Had a barium x-ray procedure recently      Fasting before the appointment:    Patients 28-13 years of age  Do not eat, drink or give tube feeds 3 hrs prior to your exam  Arrive 1 hr prior to your exam time  Your child????s exam may need to be rescheduled if the above preparations are not followed.    Patients 55 years of age and older  Please do not eat or drink anything 1 hr prior to exam         Dec 28, 2023 1:30 PM  (Arrive by 1:00 PM)  MODIFIED BARIUM SWALLOW EVAL with Lucila Maine, SLP  [facility] ADULT SPEECH THERAPY [place] [facility] REGION) [address]  [place] [address]  [phone]        Jan 19, 2024  ESOPHAGEAL MOTILITY STUDY W/INT & REP, ESOPH FUNCT TST NASL ELEC PLCMT with Nurse-Based Giproc  GI  PERIOP [facility] REGION) [address]  [place] [address]  [phone]     Feb 01, 2024 12:00 PM  (Arrive by 11:45 AM)  RETURN  ESO with Tupman Sever, NP  [facility] GI MEDICINE [place] [facility] REGION) [address]  [facility] [address]  [place] [address]  [phone]        Jun 02, 2024 11:15 AM  (Arrive by 11:00 AM)  MRI Brain With and Without Contrast with HBR MRI MBL  IMG MRI [facility]) [address]  [place] [address]  [phone]   On appt date:  Bring recent lab work  Bring documentation of any metal object implants  Take meds as usual  Check w/physician if diabetic  You will be asked to change into a gown for your safety    On appt date do not:  No restrictions on food/drink  Wear metallic items including jewelry (we are not responsible for lost items)    Let us know if pt:  Claustrophobic  Metal object implant  Pregnant  Prescribed a sedative  On dialysis  Allergic to MRI dye/contrast  Kidney Failure    (Title:MRIWCNTRST)              ______________________________________________________________________  Discharge Day Services:  BP 105/53  - Pulse 82  - Temp 36.9 ??C (98.4 ??F) (Oral)  - Resp 18  - Ht 162.6 cm (5' 4)  - Wt 93.1 kg (205 lb 3.2 oz)  - LMP 08/03/2006  - SpO2 96%  - BMI 35.22 kg/m??   Pt seen on the day of discharge and determined appropriate for discharge.    GEN: non-toxic appearing, lying in bed, NAD   HEENT: NCAT, No scleral icterus. Conjunctiva non-erythematous. MMM.  CV: Regular rate and rhythm. No murmurs/rubs/gallops.  Pulm: Normal work of breathing on RA. CTAB. No wheezing, crackles, or rhonchi.  Abd:  Mild RUQ tenderness. No guarding, rebound.  Normoactive bowel sounds.    Neuro: A&O x 3. No focal deficits. Asterixis present  Ext: No peripheral edema.  Palpable distal pulses.  Skin: psoriatic rash over the arms and abdomen    Condition at Discharge: stable    Length of Discharge: I spent greater than 30 mins in the discharge of this patient.     Enrique Sack, MD  Resident Physician, PGY-1  [facility]

## 2023-11-25 MED ORDER — CHOLESTYRAMINE (WITH SUGAR) 4 GRAM POWDER FOR SUSP IN A PACKET
Freq: Every day | ORAL | 0 refills | 30.00 days | Status: CP
Start: 2023-11-25 — End: 2023-12-25

## 2023-11-25 NOTE — Unmapped (Addendum)
Pt is alert and oriented. AVS reviewed with verbal understanding.  Problem: Adult Inpatient Plan of Care  Goal: Plan of Care Review  11/24/2023 1612 by Lucendia Herrlich, RN  Outcome: Discharged to Home  11/24/2023 1132 by Lucendia Herrlich, RN  Outcome: Progressing  Goal: Patient-Specific Goal (Individualized)  11/24/2023 1612 by Lucendia Herrlich, RN  Outcome: Discharged to Home  11/24/2023 1132 by Lucendia Herrlich, RN  Outcome: Progressing  Goal: Absence of Hospital-Acquired Illness or Injury  11/24/2023 1612 by Lucendia Herrlich, RN  Outcome: Discharged to Home  11/24/2023 1132 by Lucendia Herrlich, RN  Outcome: Progressing  Intervention: Identify and Manage Fall Risk  Recent Flowsheet Documentation  Taken 11/24/2023 1000 by Lucendia Herrlich, RN  Safety Interventions:   fall reduction program maintained   assistive device  Taken 11/24/2023 0745 by Lucendia Herrlich, RN  Safety Interventions:   fall reduction program maintained   assistive device  Goal: Optimal Comfort and Wellbeing  11/24/2023 1612 by Lucendia Herrlich, RN  Outcome: Discharged to Home  11/24/2023 1132 by Lucendia Herrlich, RN  Outcome: Progressing  Goal: Readiness for Transition of Care  11/24/2023 1612 by Lucendia Herrlich, RN  Outcome: Discharged to Home  11/24/2023 1132 by Lucendia Herrlich, RN  Outcome: Progressing  Goal: Rounds/Family Conference  11/24/2023 1612 by Lucendia Herrlich, RN  Outcome: Discharged to Home  11/24/2023 1132 by Lucendia Herrlich, RN  Outcome: Progressing     Problem: Fall Injury Risk  Goal: Absence of Fall and Fall-Related Injury  11/24/2023 1612 by Lucendia Herrlich, RN  Outcome: Discharged to Home  11/24/2023 1132 by Lucendia Herrlich, RN  Outcome: Progressing  Intervention: Promote Injury-Free Environment  Recent Flowsheet Documentation  Taken 11/24/2023 1000 by Lucendia Herrlich, RN  Safety Interventions:   fall reduction program maintained   assistive device  Taken 11/24/2023 0745 by Lucendia Herrlich, RN  Safety Interventions:   fall reduction program maintained   assistive device     Problem: Self-Care Deficit  Goal: Improved Ability to Complete Activities of Daily Living  11/24/2023 1612 by Lucendia Herrlich, RN  Outcome: Discharged to Home  11/24/2023 1132 by Lucendia Herrlich, RN  Outcome: Progressing

## 2023-11-26 ENCOUNTER — Other Ambulatory Visit: Payer: Self-pay | Admitting: Psychiatry

## 2023-11-26 NOTE — Telephone Encounter (Signed)
Routing to Dr. Mortimer Fries to make aware.

## 2023-11-27 DIAGNOSIS — R110 Nausea: Principal | ICD-10-CM

## 2023-11-27 MED ORDER — ONDANSETRON 4 MG DISINTEGRATING TABLET
ORAL_TABLET | 0 refills | 0.00 days
Start: 2023-11-27 — End: ?

## 2023-11-28 MED ORDER — ONDANSETRON 4 MG DISINTEGRATING TABLET
ORAL_TABLET | Freq: Two times a day (BID) | 0 refills | 15.00 days | Status: CP | PRN
Start: 2023-11-28 — End: 2024-11-27

## 2023-11-30 DIAGNOSIS — M549 Dorsalgia, unspecified: Principal | ICD-10-CM

## 2023-12-01 MED ORDER — CLOTRIMAZOLE 10 MG TROCHE
ORAL_TABLET | ORAL | 0 refills | 14.00 days | Status: CP
Start: 2023-12-01 — End: 2023-12-15

## 2023-12-03 ENCOUNTER — Ambulatory Visit
Admit: 2023-12-03 | Discharge: 2023-12-04 | Payer: PRIVATE HEALTH INSURANCE | Attending: Podiatrist | Primary: Podiatrist

## 2023-12-03 DIAGNOSIS — H16133 Photokeratitis, bilateral: Principal | ICD-10-CM

## 2023-12-03 DIAGNOSIS — E1142 Type 2 diabetes mellitus with diabetic polyneuropathy: Principal | ICD-10-CM

## 2023-12-03 DIAGNOSIS — M2042 Other hammer toe(s) (acquired), left foot: Principal | ICD-10-CM

## 2023-12-03 DIAGNOSIS — M2041 Other hammer toe(s) (acquired), right foot: Principal | ICD-10-CM

## 2023-12-03 DIAGNOSIS — B070 Plantar wart: Principal | ICD-10-CM

## 2023-12-03 NOTE — Unmapped (Addendum)
Nervive contains alpha lipoic acid, vitamin B12 and B6 to help promote nerve health. You can purchase this off of [employer] or at your local pharmacy.      Patient Education        Diabetes Foot Health: Care Instructions  When you have diabetes, your feet need extra care. Diabetes can damage nerves and blood vessels in your feet. You may not feel a blister, callus, or other injury. It could become a larger sore (ulcer), or it could lead to a serious infection. Taking care of your feet can help prevent these problems.  How can you care for your feet?  Caring for your feet can be quick and easy. You may want to do your daily foot care when you???re bathing or getting ready for bed.    Check your feet daily for blisters or sores. Use a mirror or have someone help you, if needed.    Wash your feet every day. Use warm (not hot) water. Pat your feet dry. Don???t rub. And dry well between your toes.    Put a thin layer of lotion on your feet. Don???t put lotion between your toes. Stop using any lotion that causes a rash.    Talk to your doctor about trimming your own toenails. Have them trimmed at your doctor???s office or at a foot clinic (podiatrist), if needed. This is especially important if you have neuropathy or vision loss, or if your toenails are thick and yellow.    If you trim your own toenails, trim them straight across. Trim them after you bathe. Use a nail file to smooth sharp edges.    Use proper footwear. Don???t wear sandals or shoes with very thin soles, and don???t go barefoot.    Tell your doctor if you have a foot problem. Get treatment for a foot problem right away, even if it???s something small like a blister.    Track your blood sugar to keep it in your target range. Follow your meal plan, and try to be active each day. Take medicines as prescribed.    If you smoke, quit or cut back as much as you can. Talk to your doctor if you need help quitting.  How can you choose shoes?    Wear shoes that fit well. Look for shoes that have plenty of space around the toes. Always wear socks with your shoes.    Break in new shoes by wearing them for no more than an hour a day for several days. Choose shoes made of cloth or leather, not plastic.  When should you call for help?    Call your doctor now or seek immediate medical care if:  You have a foot sore, an ulcer or break in the skin, bleeding corns or calluses, or an ingrown toenail.  You have blue or black areas, which can mean bruising or blood flow problems.  You have peeling skin or tiny blisters between your toes or cracking or oozing of the skin.  You have a fever for more than 24 hours and a foot sore.  You have new numbness or tingling in your feet that does not go away after you move your feet or change positions.  You have unexplained or unusual swelling of the foot or ankle.  Watch closely for changes in your health, and be sure to contact your doctor if:  You cannot do proper foot care.  Follow-up care is a key part of your treatment and safety. Be sure  to make and go to all appointments, and call your doctor if you are having problems. It's also a good idea to know your test results and keep a list of the medicines you take.  Where can you learn more?  Go to [facility] at [email or web address].[email or web address] in the Menu. Enter A739 in the search box to learn more about Diabetes Foot Health: Care Instructions.  Current as of: August 25, 2022  Content Version: 14.1  ?? 2006-2024 [employer].   Care instructions adapted under license by [facility]. If you have questions about a medical condition or this instruction, always ask your healthcare professional. [employer] disclaims any warranty or liability for your use of this information.

## 2023-12-03 NOTE — Unmapped (Signed)
New Patient Clinic Note    Referring Physician :  Benison Pap Mangel    History of Present Illness:    Ashley Briggs is a 58 y.o. female with history of T2DM, hepatic cirrhosis, AKI, CAD, HTN, HLD, psoriasis, anxiety, depression, who presents for new patient evaluation of neuropathy.     Last A1c was 7.9% on 11/24/23.   Labs reviewed and interpreted as a slightly elevated A1c      Today, patient notes that pain will occur constantly. She also notes bumps on the top of both feet and both ankles. Denies any bleeding associated with the bumps. Patient endorses lower back pain. She is scheduled to follow with pain management later this month for back pain. Patient was recently hospitalized for abdominal pain and dizziness and ultimately found to have hyperkalemia and AKI. She reports that she feels much better since being discharged. Patient is a nonsmoker. Denies any other concerns.     Subjective:   Nature:  jabs, burning on bottom of feet, stabbing, sharp, tingling, numbness, intense itching  Location:  right and left foot  Duration estimate: 12 months  Onset:  constant pain, notes numbness will cause balance issues  Course: gradually worsening  Ancillary tests: none  Treatment (previous): none. Patient states she was unsure what to do for pain  Pain score: 10      Review of Systems:     Musculoskeletal:  Denies back pain or joint pain   Integument:  Denies rash   Neurologic:  Denies headache, focal weakness or sensory changes     BP 115/55 (BP Site: R Arm, BP Position: Sitting)  - Pulse 78  - Temp 36.7 ??C (98.1 ??F) (Temporal)  - LMP 08/03/2006       Allergies:  No Known Allergies    Medications:   Outpatient Encounter Medications as of 12/03/2023   Medication Sig Dispense Refill    acetaminophen (TYLENOL) 325 MG tablet Take 2 tablets (650 mg total) by mouth two (2) times a day as needed for pain. 180 tablet 1    aspirin (ECOTRIN) 81 MG tablet Take 1 tablet (81 mg total) by mouth daily. 90 tablet 3    baclofen (LIORESAL) 5 mg Tab tablet Increase to 10mg  nightly. 60 tablet 11    blood sugar diagnostic Strp Test two (2) times a day (30 minutes before a meal). 100 strip 1    blood-glucose sensor (FREESTYLE LIBRE 3 SENSOR) Devi Use 1 sensor every 15 days 6 each 3    buPROPion (WELLBUTRIN XL) 150 MG 24 hr tablet Take 1 tablet (150 mg total) by mouth daily.      canagliflozin (INVOKANA) 300 mg Tab tablet Take 1 tablet (300 mg total) by mouth daily before breakfast. 90 tablet 0    carvedilol (COREG) 6.25 MG tablet Take 1 tablet (6.25 mg total) by mouth two (2) times a day. 60 tablet 11    chlorhexidine (PERIDEX) 0.12 % solution 15 mL by Mouth route daily.      cholestyramine (QUESTRAN) 4 gram packet Dissolve as directed and take 1 packet by mouth daily. 30 each 0    clotrimazole (MYCELEX) 10 mg troche Take 1 tablet (10 mg total) by mouth five (5) times a day for 14 days. 70 tablet 0    empty container Misc Use as directed to dispose of Cosentyx pens. 1 each 2    ferrous sulfate (SLOW FE) 142 mg (45 mg iron) TbER Take 1 tablet (142 mg) by mouth  two (2) times a day. 60 tablet 3    furosemide (LASIX) 40 MG tablet Take 1 tablet (40 mg total) by mouth two (2) times a day. 60 tablet 11    gabapentin (NEURONTIN) 300 MG capsule Take 1 capsule (300 mg total) by mouth Three (3) times a day. 90 capsule 0    insulin glargine (BASAGLAR, LANTUS) 100 unit/mL (3 mL) injection pen Inject 0.36 mL (36 Units total) under the skin nightly. 15 mL 3    insulin lispro (HUMALOG) 100 unit/mL injection pen Inject 8 Units under the skin Three (3) times a day before meals. 15 mL 3    lactulose 10 gram/15 mL solution Take 30 mL (20 g total) by mouth four (4) times a day. 3600 mL 0    lancets (ACCU-CHEK SOFTCLIX LANCETS) Misc Use as directed to test once daily. 100 each 1    magnesium oxide (MAG-OX) 400 mg (241.3 mg elemental magnesium) tablet Take 2 tablets (800 mg total) by mouth two (2) times a day. 120 tablet 11    metFORMIN (GLUCOPHAGE-XR) 500 MG 24 hr tablet Take 1 tablet (500 mg total) by mouth Two (2) times a day (30 minutes before a meal). 360 tablet 0    mirtazapine (REMERON) 7.5 MG tablet Take 1 tablet (7.5 mg total) by mouth.      naltrexone (DEPADE) 50 mg tablet Take 1 tablet (50 mg total) by mouth daily. Start with half a table (25 mg) a day for 2 weeks before increasing to a full tablet (50 mg) a day 30 tablet 11    ondansetron (ZOFRAN-ODT) 4 MG disintegrating tablet Dissolve 1 tablet (4 mg total) in the mouth every twelve (12) hours as needed for nausea. 30 tablet 0    pantoprazole (PROTONIX) 40 MG tablet Take 1 tablet (40 mg total) by mouth two (2) times a day. 180 tablet 1    pen needle, diabetic (ULTICARE PEN NEEDLE) 32 gauge x 1/4 (6 mm) Ndle Inject 1 each under the skin nightly. 100 each 1    promethazine (PHENERGAN) 12.5 MG tablet Take 1 tablet (12.5 mg total) by mouth every eight (8) hours as needed for nausea. 60 tablet 1    rifAXIMin (XIFAXAN) 550 mg Tab Take 1 tablet (550 mg total) by mouth two (2) times a day. 60 tablet 11    secukinumab (COSENTYX PEN) 150 mg/mL PnIj injection Inject the contents of 2 pens (300 mg total) under the skin once a week at weeks 0, 1, 2, 3, and 4. THEN inject the contents of 2 pens (300 mg total) every 4 weeks thereafter. 10 mL 0    secukinumab (COSENTYX PEN, 2 PENS,) 150 mg/mL PnIj injection Inject the contents of 2 pens (300 mg total) under the skin every twenty-eight (28) days. Maintenance dose 2 mL 5    simvastatin (ZOCOR) 20 MG tablet Take 1 tablet (20 mg total) by mouth every evening. 90 tablet 0    spironolactone (ALDACTONE) 100 MG tablet Take 1 tablet (100 mg total) by mouth two (2) times a day. 60 tablet 11    zolpidem (AMBIEN) 5 MG tablet Take 1 tablet (5 mg total) by mouth nightly as needed.      blood-glucose meter,continuous (DEXCOM G7 RECEIVER) Misc Use as instructed with G7 sensors. 1 each 1    blood-glucose sensor (DEXCOM G7 SENSOR) Devi Use to monitor blood glucose levels continuously. Change sensor every 10 days. 9 each 3    lidocaine (ASPERCREME) 4 % patch Place  1 patch on the skin daily. Apply for 12 hours then remove for 12 hours. 15 patch 0    [EXPIRED] oxyCODONE (ROXICODONE) 5 MG immediate release tablet Take 1 tablet (5 mg total) by mouth every six (6) hours as needed for pain for up to 5 days. 20 tablet 0    [DISCONTINUED] amoxicillin-clavulanate (AUGMENTIN) 875-125 mg per tablet Take 1 tablet by mouth two (2) times a day for 10 days. 20 tablet 0    [DISCONTINUED] baclofen (LIORESAL) 5 mg Tab tablet Take 1 tablet (5 mg total) by mouth nightly. 30 tablet 11    [DISCONTINUED] blood-glucose sensor (DEXCOM G7 SENSOR) Devi Use to monitor blood glucose levels continuously. Change sensor every 10 days. 9 each 3    [DISCONTINUED] cholestyramine-aspartame (CHOLESTYRAMINE LIGHT) 4 gram PwPk Mix 1 packet as directed and take by mouth daily. 60 packet 5    [DISCONTINUED] ciprofloxacin HCl (CIPRO) 500 MG tablet Take 1 tablet (500 mg total) by mouth two (2) times a day. 5 tablet 0    [DISCONTINUED] clotrimazole (MYCELEX) 10 mg troche Dissolve 1 tablet (10 mg total) in mouth five (5) times a day. 20 tablet 0    [DISCONTINUED] dulaglutide (TRULICITY) 1.5 mg/0.5 mL PnIj Inject 0.5 mL (1.5 mg total) under the skin every seven (7) days. 4 mL 6    [DISCONTINUED] gabapentin (NEURONTIN) 300 MG capsule Take 1 capsule (300 mg total) by mouth Three (3) times a day. 90 capsule 0    [DISCONTINUED] gabapentin (NEURONTIN) 300 MG capsule Take by mouth. (Patient not taking: Reported on 10/26/2023)      [DISCONTINUED] insulin glargine (BASAGLAR, LANTUS) 100 unit/mL (3 mL) injection pen Inject 0.6 mL (60 Units total) under the skin nightly. 54 mL 1    [DISCONTINUED] insulin glargine (LANTUS SOLOSTAR U-100 INSULIN) 100 unit/mL (3 mL) injection pen Inject 0.5 mL (50 Units total) under the skin nightly. Inject as the units of insulin as directed by your doctor once daily around same time every day.  Total daily dose not to exceed 50 units. 15 mL 0    [DISCONTINUED] insulin lispro (HUMALOG) 100 unit/mL injection pen Inject 10 Units under the skin Three (3) times a day before meals. 15 mL 2    [DISCONTINUED] insulin lispro (HUMALOG) 100 unit/mL injection pen Inject 10 Units under the skin Three (3) times a day before meals. 15 mL 1    [DISCONTINUED] lactulose 10 gram/15 mL solution Take 30 mL (20 g total) by mouth Three (3) times a day. 2700 mL 11    [DISCONTINUED] metFORMIN (GLUCOPHAGE-XR) 500 MG 24 hr tablet Take 2 tablets (1,000 mg total) by mouth in the morning and 2 tablets (1,000 mg total) in the evening. Take with meals. 360 tablet 0    [DISCONTINUED] ondansetron (ZOFRAN-ODT) 4 MG disintegrating tablet Dissolve 1 tablet (4 mg total) in the mouth every twelve (12) hours as needed for nausea for up to 15 days. 30 tablet 0    [DISCONTINUED] ondansetron (ZOFRAN-ODT) 4 MG disintegrating tablet Dissolve 1 tablet (4 mg total) in the mouth every twelve (12) hours as needed for nausea. 30 tablet 0    [EXPIRED] sodium chloride 0.9% (NS) bolus 1,000 mL       [EXPIRED] sodium chloride 0.9% (NS) bolus 500 mL       [DISCONTINUED] acetaminophen (TYLENOL) tablet 650 mg       [DISCONTINUED] aspirin chewable tablet 81 mg       [DISCONTINUED] baclofen (LIORESAL) tablet 10 mg       [  DISCONTINUED] buPROPion (Wellbutrin XL) 24 hr tablet 150 mg       [DISCONTINUED] carvedilol (COREG) tablet 6.25 mg       [DISCONTINUED] chlorhexidine (PERIDEX) 0.12 % solution 15 mL       [DISCONTINUED] cholestyramine (QUESTRAN) 4 gram packet 1 packet       [DISCONTINUED] clotrimazole (MYCELEX) troche 10 mg       [DISCONTINUED] empagliflozin (JARDIANCE) tablet 25 mg       [DISCONTINUED] enoxaparin (LOVENOX) syringe 40 mg       [DISCONTINUED] furosemide (LASIX) tablet 40 mg       [DISCONTINUED] gabapentin (NEURONTIN) capsule 300 mg       [DISCONTINUED] insulin glargine (LANTUS) injection 60 Units       [DISCONTINUED] insulin lispro (HumaLOG) injection 10 Units [DISCONTINUED] magnesium oxide (MAG-OX) tablet 800 mg       [DISCONTINUED] metFORMIN (GLUCOPHAGE-XR) 24 hr tablet 500 mg       [DISCONTINUED] mirtazapine (REMERON) tablet 7.5 mg       [DISCONTINUED] naltrexone (DEPADE) tablet 50 mg       [DISCONTINUED] ondansetron (ZOFRAN-ODT) disintegrating tablet 4 mg       [DISCONTINUED] pantoprazole (Protonix) EC tablet 40 mg       [DISCONTINUED] polyethylene glycol (MIRALAX) packet 17 g       [DISCONTINUED] pravastatin (PRAVACHOL) tablet 40 mg       [DISCONTINUED] rifAXIMin (XIFAXAN) tablet 550 mg       [DISCONTINUED] sodium zirconium cyclosilicate (LOKELMA) packet 10 g       [DISCONTINUED] spironolactone (ALDACTONE) tablet 100 mg       [DISCONTINUED] zolpidem (AMBIEN) tablet 5 mg        No facility-administered encounter medications on file as of 12/03/2023.       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 type 2 diabetes mellitus (CMS-HCC) 12/02/2022    Dry eye syndrome, bilateral 12/21/2020    Dysphagia 02/02/2023    Endometrial cancer (CMS-HCC) 2007    Esophageal varices in cirrhosis (CMS-HCC) 06/17/2022    Fractures 01/29/2018    Gastroparesis     Generalized pruritus 02/02/2023    GERD (gastroesophageal reflux disease) It's  been going on for years    I take medication  which helps tremendously    Glaucoma 2023    I see a doctor on a regular basis    H/O diabetic gastroparesis 01/09/2021    Heart disease     Hemorrhage of rectum and anus 03/17/2011    Hepatic cirrhosis (CMS-HCC) 06/17/2022    Hepatic encephalopathy (CMS-HCC) 06/17/2022    Hepatic steatosis 03/13/2021    Noted on CT Fall 2021    High cholesterol  Hx of psoriasis 08/06/2022    Hyperlipidemia 12/12/2015    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    Liver disease     Major depression     Metabolic dysfunction-associated steatohepatitis (MASH)     Morbid obesity (CMS-HCC) 11/10/2019    Myopia of both eyes with astigmatism and presbyopia 12/02/2022    Nausea 09/22/2022    Non-alcoholic fatty liver disease     NPDR (nonproliferative diabetic retinopathy) (CMS-HCC)     Right eye    Other ascites 09/22/2022    Other chronic pain 11/06/2022    Other insomnia 11/06/2022    PFD (pelvic floor dysfunction) 04/07/2013    Portal hypertension (CMS-HCC) 10/08/2022    Psoriasis     Ptosis of eyelid, left     Left upper eyelid    Reflux     Retinopathy of left eye, background, proliferative 01/09/2021    Right upper quadrant abdominal pain 09/22/2022    Secondary esophageal varices without bleeding (CMS-HCC) 06/17/2022    Type 2 diabetes mellitus, with long-term current use of insulin (CMS-HCC) 12/12/2015    Last Assessment & Plan:   The current medical regimen is effective;  continue present plan and medications.  Patient will continue good management of diabetes especially with weight loss. Hopefully with further weight loss will run into the problem of being overmedicated and having to cut back on medications     Some confusion on diabetes medications was have not.been refilled for over a year.  Pa       Surgical History:  Past Surgical History:   Procedure Laterality Date    CHOLECYSTECTOMY      HYSTERECTOMY      endometrial cancer    HYSTERECTOMY      OOPHORECTOMY      OTHER SURGICAL HISTORY  Hysterectomy was in 2007. Gallbladder was in 1999 and phrenectomy  was in 2024    Hysterectomy,  gallbladder removed and phrenectomy    PR ANAL PRESSURE RECORD Left 03/31/2013    Procedure: ANORECTAL MANOMETRY;  Surgeon: None None;  [facility];  Service: Gastroenterology    PR BREATH HYDROGEN TEST N/A 03/31/2013    Procedure: BREATH HYDROGEN TEST;  Surgeon: None None;  [facility];  Service: Gastroenterology    PR COLSC FLX W/RMVL OF TUMOR POLYP LESION SNARE TQ N/A 09/06/2020    Procedure: COLONOSCOPY FLEX; W/REMOV TUMOR/LES BY SNARE;  Surgeon: Chriss Driver, MD;  [facility];  Service: Gastroenterology    PR UPPER GI ENDOSCOPY,BIOPSY N/A 04/15/2013    Procedure: UGI ENDOSCOPY; WITH BIOPSY, SINGLE OR MULTIPLE;  Surgeon: Vickii Chafe, MD;  [facility];  Service: Gastroenterology    PR UPPER GI ENDOSCOPY,BIOPSY N/A 09/06/2020    Procedure: UGI ENDOSCOPY; WITH BIOPSY, SINGLE OR MULTIPLE;  Surgeon: Chriss Driver, MD;  [facility];  Service: Gastroenterology    PR UPPER GI ENDOSCOPY,BIOPSY N/A 05/05/2022    Procedure: UGI ENDOSCOPY; WITH BIOPSY, SINGLE OR MULTIPLE;  Surgeon: Chriss Driver, MD;  [facility];  Service: Gastroenterology    PR UPPER GI ENDOSCOPY,BIOPSY N/A 01/22/2023    Procedure: UGI ENDOSCOPY; WITH BIOPSY, SINGLE OR MULTIPLE;  Surgeon: Vonda Antigua, MD;  [facility];  Service: Gastroenterology       Social History:  Social History     Socioeconomic History    Marital status: Widowed  Tobacco Use    Smoking status: Never     Passive exposure: Current    Smokeless tobacco: Never   Vaping Use    Vaping status: Never Used   Substance and Sexual Activity    Alcohol use: Not Currently     Comment: No longer    Drug use: Not Currently     Types: Marijuana, Codeine, Hydrocodone, Morphine, Oxycodone    Sexual activity: Not Currently     Partners: Male     Birth control/protection: Abstinence   Other Topics Concern    Do you use sunscreen? Yes    Tanning bed use? No    Are you easily burned? Yes    Excessive sun exposure? No    Blistering sunburns? Yes     Comment: When I was in my teens and twenties   Social History Narrative    Widowed    No children    Not currently working:  Previously worked as a Engineer, site     Social Drivers of Psychologist, prison and probation services Strain: Low Risk  (08/18/2023)    Overall Financial Resource Strain (CARDIA)     Difficulty of Paying Living Expenses: Not very hard   Food Insecurity: No Food Insecurity (08/18/2023)    Hunger Vital Sign     Worried About Running Out of Food in the Last Year: Never true     Ran Out of Food in the Last Year: Never true   Transportation Needs: No Transportation Needs (08/18/2023)    PRAPARE - Therapist, art (Medical): No     Lack of Transportation (Non-Medical): No   Physical Activity: Insufficiently Active (01/09/2021)    Exercise Vital Sign     Days of Exercise per Week: 1 day     Minutes of Exercise per Session: 40 min   Stress: Stress Concern Present (09/17/2022)    [employer] of Occupational Health - Occupational Stress Questionnaire     Feeling of Stress : Very much   Social Connections: Moderately Isolated (09/02/2018)    Received from [facility]    Social Connection and Isolation Panel [NHANES]     Frequency of Communication with Friends and Family: Once a week     Frequency of Social Gatherings with Friends and Family: Twice a week     Attends Religious Services: Never     Database administrator or Organizations: No     Attends Banker Meetings: Never     Marital Status: Widowed       Family History:  Family History   Problem Relation Age of Onset    Diabetes Mother     Cancer Mother         Complications during dialysis    Depression Mother     Hypertension Mother     Vision loss Mother     No Known Problems Father     No Known Problems Brother     Diabetes Maternal Grandmother     Hypertension Maternal Grandmother     Alcohol abuse Maternal Grandmother         Heavy drinker    Arthritis Maternal Grandmother     Cancer Maternal Grandfather     Diabetes Maternal Grandfather     Alcohol abuse Maternal Grandfather     Heart disease Maternal Grandfather     No Known Problems Paternal Grandmother     Skin cancer Paternal Grandfather  Stroke Paternal Grandfather     Vision loss Maternal Aunt         Heart Attack    No Known Problems Maternal Uncle     No Known Problems Paternal Aunt     No Known Problems Paternal Uncle     No Known Problems Other     Melanoma Neg Hx     Basal cell carcinoma Neg Hx     Squamous cell carcinoma Neg Hx     Substance Abuse Disorder Neg Hx     Drug abuse Neg Hx     Mental illness Neg Hx     Amblyopia Neg Hx     Blindness Neg Hx     Cataracts Neg Hx     Glaucoma Neg Hx     Macular degeneration Neg Hx     Retinal detachment Neg Hx     Strabismus Neg Hx     Thyroid disease Neg Hx     Breast cancer Neg Hx            Physical Exam:   Consitutional: No acute distress, alert and oriented.    Vascular:   Palpable pulse of DP artery right foot 1/4  Nonpalpable pulses of DP artery left foot and PT bilaterally   Normal triphasic PT signals bilaterally using handheld Doppler  Absent DP signal bilaterally using handheld Doppler   Temperature gradient within normal limits  Capillary refill brisk x 10  No varicosities noted bilaterally    Neurologic:    Right Left   10 gram filament [x]  Present  []  Not present  []  Intermittent [x]  Present  []  Not present  []  Intermittent   Positional sense great toe joint [x]  Present  []  Not present [x]  Present  []  Not present   128 cps vibratory sense [x]  Diminished  []  Intact [x]  Diminished  []  Intact   Deep tendon reflexes 2/5 [x]  2/5 normal  []  Abnormal [x]  2/5 normal  []  Abnormal   Babinski sign []  Present  [x]  Not present []  Present  [x]  Not present Orthopedic:   Average arch morphology   No evidence of Charcot  Hammertoes bilateral 2nd toes    Dermatologic:   No callus development   No interdigital fissures or maceration bilateral feet   Nails are normal in appearance and healthy  No wounds bilateral feet  Healing small erythematous patches, total of 6 on dorsum right foot and left lateral ankle  Small patches of skin colored tags that is most consistent with actinic keratosis     Test Results  None     Imaging:   None     Assessment/Recommendations:      Plan:     Chronic T2DM with painful peripheral neuropathy - We discussed there are many causes for neuropathy, including low B12 levels, high A1c, and low thyroid levels. We discussed diabetes may be the likely contributor to neuropathy. Recommend OTC Nervive supplement and taking it for at least 1 month. We discussed burning symptoms is likely related to her neuropathy. Patient is scheduled to follow with the pain clinic later this month for her back and we discussed the pain clinic may provide recommendations to help address pain in her feet. Patient was given instructions on diabetic foot care, including not going barefoot, checking water temperature with elbow, and frequent monitoring of feet for open sores or wounds. Prescription for diabetic shoes and inserts provided.     Hammertoes - Diabetic shoes and inserts should accommodate.  We discussed nonsurgical vs surgical treatment. Nonsurgical options include soft shoes with a wide toe box to prevent irritation at the toe. We discussed if the hammertoes become bothersome and painful, we would consider surgery as a last resort to straighten the toes    Actinic keratosis - Patient reports she has an established dermatologist. Recommend following with her dermatologist to have the patches on her feet and ankle evaluated and managed.     RTC 3 months    ______________________________________________________________________    Documentation assistance was provided by Kearney Hard, Scribe, on December 03, 2023 at 8:32 AM for London Sheer, DPM.    ----------------------------------------------------------------------------------------------------------------------  December 03, 2023 10:21 AM. Documentation assistance provided by the Scribe. I was present during the time the encounter was recorded. The information recorded by the Scribe was done at my direction and has been reviewed and validated by me.  ----------------------------------------------------------------------------------------------------------------------

## 2023-12-07 ENCOUNTER — Ambulatory Visit
Admit: 2023-12-07 | Discharge: 2023-12-08 | Payer: PRIVATE HEALTH INSURANCE | Attending: Student in an Organized Health Care Education/Training Program | Primary: Student in an Organized Health Care Education/Training Program

## 2023-12-07 DIAGNOSIS — N179 Acute kidney failure, unspecified: Principal | ICD-10-CM

## 2023-12-07 DIAGNOSIS — K7682 Hepatic encephalopathy (CMS-HCC): Principal | ICD-10-CM

## 2023-12-07 DIAGNOSIS — Z09 Encounter for follow-up examination after completed treatment for conditions other than malignant neoplasm: Principal | ICD-10-CM

## 2023-12-07 DIAGNOSIS — I10 Essential (primary) hypertension: Principal | ICD-10-CM

## 2023-12-07 DIAGNOSIS — I8510 Secondary esophageal varices without bleeding: Principal | ICD-10-CM

## 2023-12-07 DIAGNOSIS — K7460 Unspecified cirrhosis of liver: Principal | ICD-10-CM

## 2023-12-07 DIAGNOSIS — D320 Benign neoplasm of cerebral meninges: Principal | ICD-10-CM

## 2023-12-07 DIAGNOSIS — R110 Nausea: Principal | ICD-10-CM

## 2023-12-07 LAB — COMPREHENSIVE METABOLIC PANEL
ALBUMIN: 2.7 g/dL — ABNORMAL LOW (ref 3.4–5.0)
ALKALINE PHOSPHATASE: 395 U/L — ABNORMAL HIGH (ref 46–116)
ALT (SGPT): 32 U/L (ref 10–49)
ANION GAP: 11 mmol/L (ref 5–14)
AST (SGOT): 61 U/L — ABNORMAL HIGH (ref <=34)
BILIRUBIN TOTAL: 4.4 mg/dL — ABNORMAL HIGH (ref 0.3–1.2)
BLOOD UREA NITROGEN: 27 mg/dL — ABNORMAL HIGH (ref 9–23)
BUN / CREAT RATIO: 21
CALCIUM: 10.3 mg/dL (ref 8.7–10.4)
CHLORIDE: 92 mmol/L — ABNORMAL LOW (ref 98–107)
CO2: 27 mmol/L (ref 20.0–31.0)
CREATININE: 1.29 mg/dL — ABNORMAL HIGH (ref 0.55–1.02)
EGFR CKD-EPI (2021) FEMALE: 48 mL/min/{1.73_m2} — ABNORMAL LOW (ref >=60)
GLUCOSE RANDOM: 249 mg/dL — ABNORMAL HIGH (ref 70–179)
POTASSIUM: 4.6 mmol/L (ref 3.4–4.8)
PROTEIN TOTAL: 9.3 g/dL — ABNORMAL HIGH (ref 5.7–8.2)
SODIUM: 130 mmol/L — ABNORMAL LOW (ref 135–145)

## 2023-12-07 LAB — LIPASE: LIPASE: 135 U/L — ABNORMAL HIGH (ref 12–53)

## 2023-12-07 MED ORDER — SCOPOLAMINE 1 MG OVER 3 DAYS TRANSDERMAL PATCH
MEDICATED_PATCH | TRANSDERMAL | 0 refills | 30.00 days | Status: CP
Start: 2023-12-07 — End: 2024-01-06

## 2023-12-07 NOTE — Unmapped (Signed)
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  -     Lipase    Hyperlipidemia, unspecified hyperlipidemia type    Other orders  -     lidocaine HCL-menthoL (NERVIVE PAIN RELIEVING) 4-1 % srlo; Apply topically.    ---  58 yo female presents for hospital follow up     Hospital follow up, AKI  -Admitted from 11/20/23 to 11/24/23 for hyperkalemia, AKI. During admission, she presented with significant AKI with cr of 2.26 (while baseline Cr is <1.0).   -It was noted per discharge summary: Of note, patient had been having ~20 bowel movements daily prior to admission and taking diuretics for cirrhosis, raising suspicion for a prerenal etiology, although FEUrea was more suggestive of intra-renal pathology.  However was then later thought to be pre-renal as she continued to have UOT and renal US was unremarkable  -Repeating CMP. Pending labs, discussed possible nephrology referral    Hypertension, hyperlipidemia  BP Readings from Last 3 Encounters:   12/07/23 104/70   12/03/23 115/55   11/24/23 105/53   -Medications that could affect her blood pressure include: Lasix 40mg  BID, Spirolactone 100mg  BID, Carvedilol 6.25mg  BID  =Continue simvastatin 20mg  every day     Calcified cerebral meningioma, hepatic encephalopathy   -She is reporting more daytime sleepiness/fatigue. Discussed concern for polypharmacy contributing to daytime sleepiness or secondary to her liver disease  -To keep neurology visit on 12/18/23 however advised caution with taking gabapentin and baclofen during the day   -Advised to continue to take medications as recommended by GI as well for her cirrhosis      Cirrhosis, Esophageal Varices, Nausea  -Last GI visit on 11/02/23:   This is a 58 y.o. year old female with both oropharyngeal and thoracic dysphagia.  Endoscopy in February 2024 revealed grade 2 esophageal varices and portal hypertensive gastropathy.  Biopsies from the proximal esophagus were suspicious for lichen planus for which she was treated with a mouthwash (swish and swallow).  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  DDx includes, but is not limited to esophageal dysmotility, impaired motility, functional dysphagia, severe GERD, neuromuscular discoordination, poor compliance of UES, cricopharyngeal dysfunction, osteophytes, inadequate mastication, decrease in salivary flow, reduced lingual control in addition to absent/delayed swallowing reflex, disruption of the oropharyngeal mucosa, among others.  Today's recommendations are as follows:  PLAN:    1.  Schedule for modified barium swallow with assistance of SLP.  Phone number provided for patient to call and set up this appointment.  2.  Schedule for esophageal manometry to assess the motility of the esophagus.  Phone number provided for patient to call and set up this appointment.  3.  Follow the ups and downs of swallowing, per AVS.  4.  Continue pantoprazole 40 mg daily.  She does not need a prescription for this today.  5.  Return to the GI clinic for routine follow-up  in about 3 months.  Patient is to reach out to me in the interim and has my contact information to do so  -Prescribed Scolp patch for nausea and discussed to not take Zofran or Phenergan as want to decrease Qtc prolonging agents  -Repeating CMP and Lipase      To keep scheduled visit for chronic care on 12/24/2023  -She is to follow up with GI for her cirrhosis, appointment on 12/22/23, procedure on 01/19/24, appointment on 02/01/24  -She is to follow up with Endocrinology for her DM,---she has no upcoming appointment. It is important she follows up with Endocrinology as her diabetes is still uncontrolled        Subjective:     Ashley Briggs 58 y.o.female   is here for follow up     She is having episodes of RUQ pain and then nausea and she feels like she will pass out.   Her bowel movements are consistent for her   Only new medication:  Nervive by podiatry     Her BP at home has been normal, 100/57 pt patient     She will go back to pain management on 12/03/2023  -she did a lot of xray and find a lt of issues with her spine      She notes that sometimes she will have foamy urine, yellow and hazy but no dysuria     Chief Complaint   Patient presents with    Follow-up     States having episodes of abdominal pain, nausea      She is getting a dizziness feeling   She feels like she is swollen       Answers submitted by the patient for this visit:  Abdominal Pain Questionnaire (Submitted on 12/01/2023)  Chief Complaint: Abdominal pain  Chronicity: chronic  Onset: today  Onset quality: sudden  Frequency: constantly  Episode duration: 8 Hours  Progression since onset: rapidly worsening  Pain location: RUQ, epigastric region, periumbilical region, suprapubic region, left flank  Pain - numeric: 10/10  Pain quality: sharp  Radiates to: RUQ, epigastric region, periumbilical region, suprapubic region, right shoulder, back  anorexia: Yes  arthralgias: Yes  belching: Yes  constipation: No  diarrhea: Yes  fever: No  flatus: No  frequency: No  headaches: Yes  hematochezia: No  hematuria: No  melena: No  myalgias: Yes  nausea: Yes  weight loss: Yes  vomiting: No  Aggravated by: bowel movement, certain positions, eating  Relieved by: being still, certain positions, palpation  Diagnostic workup: CT scan, ultrasound    HOSPITAL ADMISSION   11/20/23 to 11/24/23  Outpatient Provider Follow Up Issues:   [ ]  Please check BMP to ensure AKI and hyperkalemia remain resolved with resumption of home Lasix and spironolactone  [ ]  Assess adequacy of current insulin regimen. Patient had diminished PO intake and blood sugars were low-normal while admitted, so her discharge regimen has been decreased to 36 units Lantus nightly, 8 units lispro with meals     Hospital Course:   Ashley Briggs is a 58 y/o F with history of CAD, decompensated NASH cirrhosis with hepatic encephalopathy, Type 2 diabetes, HLD, psoriasis on Cosentynx, anxiety and depression who is admitted for dizziness and confusion, ultimately found to have profound hyperkalemia and AKI.      Hyperkalemia  On initial presentation, patient was noted to be hyperkalemic to 7.3. EKG showed sinus arrhythmia. Patient was treated with albuterol, insulin, calcium gluconate and Lokelma with improvement in K.  Case was discussed with nephrology who felt that hyperkalemic was likely due to new AKI vs use of spironolactone at home. Spironolactone was held and patient was started on Lokelma 10 mg TID. 36 hours following admission, patient's K improved to 5.1 and Lokelma was stopped. Her labs continued to normalize and her home Lasix and spironolactone were slowly resumed. Of note, previous diarrhea at home resolved as well and she was restarted on her home lactulose, now QID to achieve adequate bowel movements. On the day of discharge she was normokalemic on spironolactone.      AKI, likely pre-renal  On admission, patient was noted to have an AKI with Cr 2.26 (baseline 0.8). Of note, patient had been having ~20 bowel movements daily prior to admission and taking diuretics for cirrhosis, raising suspicion for a prerenal etiology, although FEUrea was more suggestive of intra-renal pathology. Despite this, two days following hospital admission, Cr improved to 1.4 with the patient making excellent urine output, thus making a pre-renal etiology more likely. Cr steadily improving towards baseline at discharge.      Brief acute urinary retention (resolved)  On 12/23, patient felt unable to void, and required a Foley catheter with good output. She was not on anticholinergic medications and urine appeared non-infectious. The following day, she passed a trial of void without specific intervention, and continued to void well on her own prior to discharge.      Decompensated cirrhosis  Patient has MASLD cirrhosis followed by [facility], complicated by esophageal varices and portal hypertensive gastropathy. Suspect that some element of confusion and asterixis on initial presentation was secondary to decompensated cirrhosis. Patient had not been taking her home lactulose due to diarrhea. This was resumed inpatient. While admitted, her spironolactone and Lasix were held given AKI and hyperkalemia, but these were able to be resumed leading up to/on discharge.      T2DM  Patient is on a home insulin regimen of Lantus 50 units nightly and lispro 10 units with meals. Given poor PO intake, report of hypoglycemia at home, and some low-normal blood sugars while admitted, insulin dosing was decreased. Sugars were at goal on Lantus 36 units nightly and lispro 8 units with meals. She is being discharged on this regimen. Repeat HbA1C while admitted was improved from prior, 7.9.     Patient's last menstrual period was 08/03/2006.        ROS:     ROS negative unless otherwise noted in HPI    Vital Signs:     Wt Readings from Last 3 Encounters:   12/07/23 89.4 kg (197 lb)   11/21/23 93.1 kg (205 lb 3.2 oz)   11/06/23 94.3 kg (207 lb 12.8 oz)     Wt Readings from Last 6 Encounters:   12/07/23 89.4 kg (197 lb)   11/21/23 93.1 kg (205 lb 3.2 oz)   11/06/23 94.3 kg (207 lb 12.8 oz)   11/05/23 96.2 kg (212 lb)   11/02/23 96.2 kg (212 lb)   10/26/23 95.8 kg (211 lb 1.6 oz)        Temp Readings from Last 3 Encounters:   12/07/23 35.7 ??C (96.2 ??F)   12/03/23 36.7 ??C (98.1 ??F) (Temporal)   11/24/23 36.9 ??C (98.4 ??F) (Oral)     BP Readings from Last 3 Encounters:   12/07/23 104/70   12/03/23 115/55   11/24/23 105/53     Pulse Readings from Last 3 Encounters:   12/07/23 73   12/03/23 78   11/24/23  82       Objective:     General Appearance: Alert, cooperative. Mildly ill appearing however  Head and Neck: Normocephalic, atraumatic.   EENT: Wearing glasses. Sclera icterus present. Dry mouth   Musculoskeletal: using cane with ambulation.   Integumentary: skin does continue to appear jaundice  Neurologic: Alert and oriented x3 CN II-XII grossly intact.   Psychiatric: Mood and affect appropriate for situation.

## 2023-12-08 NOTE — Unmapped (Signed)
MyChart message from patient stating that she contacted the [facility] today,  12/08/2023. They checked their database and my referral isn't  there. I explained my situation   and9 they suggested that I have you contact the clinic directly.  They gave me the fax number and the name of the lady who runs the Transplant referrals , paperwork and appointments.   Her name is Jasmine December and the fax number is   [phone].    Chart reviewed. Noted Dr. Eudelia Bunch sent referral via Epic 09/08/23.     This RN refaxed referral to include order, face sheet, and clinical notes to the attn of Jasmine December to [phone] as requested.     Shon Baton, MSN-Ed, RN,   Nursing Coordinator for   Dr. Eudelia Bunch & Betsey Holiday., PA-C  [facility]   [phone]

## 2023-12-09 DIAGNOSIS — N179 Acute kidney failure, unspecified: Principal | ICD-10-CM

## 2023-12-09 MED ORDER — BLOOD SUGAR DIAGNOSTIC STRIPS
ORAL_STRIP | Freq: Three times a day (TID) | 6 refills | 0.00 days | Status: CP
Start: 2023-12-09 — End: 2024-12-08

## 2023-12-10 DIAGNOSIS — Z794 Long term (current) use of insulin: Principal | ICD-10-CM

## 2023-12-10 DIAGNOSIS — E113512 Type 2 diabetes mellitus with proliferative diabetic retinopathy with macular edema, left eye: Principal | ICD-10-CM
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
## 2023-12-14 ENCOUNTER — Ambulatory Visit: Payer: Medicaid Other | Admitting: Psychiatry

## 2023-12-15 ENCOUNTER — Inpatient Hospital Stay
Admit: 2023-12-15 | Discharge: 2023-12-18 | Disposition: A | Discharge status: Home / Self Care | Payer: PRIVATE HEALTH INSURANCE | Admitting: Student in an Organized Health Care Education/Training Program

## 2023-12-15 ENCOUNTER — Ambulatory Visit: Admit: 2023-12-15 | Discharge: 2023-12-18 | Payer: PRIVATE HEALTH INSURANCE

## 2023-12-15 LAB — COMPREHENSIVE METABOLIC PANEL
ALBUMIN: 2.7 g/dL — ABNORMAL LOW (ref 3.4–5.0)
ALKALINE PHOSPHATASE: 360 U/L — ABNORMAL HIGH (ref 46–116)
ALT (SGPT): 34 U/L (ref 10–49)
ANION GAP: 14 mmol/L (ref 5–14)
AST (SGOT): 67 U/L — ABNORMAL HIGH (ref <=34)
BILIRUBIN TOTAL: 5.5 mg/dL — ABNORMAL HIGH (ref 0.3–1.2)
BLOOD UREA NITROGEN: 34 mg/dL — ABNORMAL HIGH (ref 9–23)
BUN / CREAT RATIO: 31
CALCIUM: 9.9 mg/dL (ref 8.7–10.4)
CHLORIDE: 90 mmol/L — ABNORMAL LOW (ref 98–107)
CO2: 24.6 mmol/L (ref 20.0–31.0)
CREATININE: 1.1 mg/dL — ABNORMAL HIGH (ref 0.55–1.02)
EGFR CKD-EPI (2021) FEMALE: 58 mL/min/{1.73_m2} — ABNORMAL LOW (ref >=60)
GLUCOSE RANDOM: 324 mg/dL — ABNORMAL HIGH (ref 70–179)
POTASSIUM: 4.9 mmol/L — ABNORMAL HIGH (ref 3.4–4.8)
PROTEIN TOTAL: 9.4 g/dL — ABNORMAL HIGH (ref 5.7–8.2)
SODIUM: 129 mmol/L — ABNORMAL LOW (ref 135–145)

## 2023-12-15 LAB — URINALYSIS WITH MICROSCOPY WITH CULTURE REFLEX PERFORMABLE
BILIRUBIN UA: NEGATIVE
GLUCOSE UA: >1000 — AB
HYALINE CASTS: 3 /LPF — ABNORMAL HIGH (ref 0–1)
KETONES UA: NEGATIVE
NITRITE UA: NEGATIVE
PH UA: 5.5 (ref 5.0–9.0)
PROTEIN UA: NEGATIVE
RBC UA: 8 /HPF — ABNORMAL HIGH (ref <=4)
SPECIFIC GRAVITY UA: 1.01 (ref 1.003–1.030)
SQUAMOUS EPITHELIAL: 3 /HPF (ref 0–5)
UROBILINOGEN UA: <2
WBC UA: 46 /HPF — ABNORMAL HIGH (ref 0–5)

## 2023-12-15 LAB — PROTIME-INR
INR: 1.22
PROTIME: 13.9 s — ABNORMAL HIGH (ref 9.9–12.6)

## 2023-12-15 LAB — TOXICOLOGY SCREEN, URINE
AMPHETAMINE SCREEN URINE: NEGATIVE
BARBITURATE SCREEN URINE: NEGATIVE
BENZODIAZEPINE SCREEN, URINE: NEGATIVE
BUPRENORPHINE, URINE SCREEN: NEGATIVE
CANNABINOID SCREEN URINE: NEGATIVE
COCAINE(METAB.)SCREEN, URINE: NEGATIVE
FENTANYL SCREEN, URINE: NEGATIVE
METHADONE SCREEN, URINE: NEGATIVE
OPIATE SCREEN URINE: NEGATIVE
OXYCODONE SCREEN URINE: NEGATIVE

## 2023-12-15 LAB — TSH: THYROID STIMULATING HORMONE: 2.523 u[IU]/mL (ref 0.550–4.780)

## 2023-12-15 LAB — CBC
HEMATOCRIT: 29.4 % — ABNORMAL LOW (ref 34.0–44.0)
HEMOGLOBIN: 9.4 g/dL — ABNORMAL LOW (ref 11.3–14.9)
MEAN CORPUSCULAR HEMOGLOBIN CONC: 32.1 g/dL (ref 32.0–36.0)
MEAN CORPUSCULAR HEMOGLOBIN: 23.6 pg — ABNORMAL LOW (ref 25.9–32.4)
MEAN CORPUSCULAR VOLUME: 73.4 fL — ABNORMAL LOW (ref 77.6–95.7)
MEAN PLATELET VOLUME: 8.4 fL (ref 6.8–10.7)
PLATELET COUNT: 156 10*9/L (ref 150–450)
RED BLOOD CELL COUNT: 4 10*12/L (ref 3.95–5.13)
RED CELL DISTRIBUTION WIDTH: 21.4 % — ABNORMAL HIGH (ref 12.2–15.2)
WBC ADJUSTED: 5.1 10*9/L (ref 3.6–11.2)

## 2023-12-15 LAB — ETHANOL: ETHANOL: <10 mg/dL (ref <=10)

## 2023-12-15 LAB — LIPASE: LIPASE: 115 U/L — ABNORMAL HIGH (ref 12–53)

## 2023-12-15 LAB — LACTATE, VENOUS, WHOLE BLOOD: LACTATE BLOOD VENOUS: 1.7 mmol/L (ref 0.5–1.8)

## 2023-12-15 LAB — AMMONIA: AMMONIA: 21 umol/L (ref 11–32)

## 2023-12-15 LAB — APTT
APTT: 34.2 s (ref 24.8–38.4)
HEPARIN CORRELATION: 0.2

## 2023-12-15 LAB — HIGH SENSITIVITY TROPONIN I - SINGLE: HIGH SENSITIVITY TROPONIN I: <3 ng/L (ref <=34)

## 2023-12-15 LAB — ACETAMINOPHEN LEVEL: ACETAMINOPHEN LEVEL: 4.1 ug/mL (ref <=20.0)

## 2023-12-15 MED ADMIN — lidocaine (ASPERCREME) 4 % 1 patch: 1 | TRANSDERMAL

## 2023-12-15 MED ADMIN — oxyCODONE (ROXICODONE) immediate release tablet 5 mg: 5 mg | ORAL | Stop: 2023-12-15

## 2023-12-15 NOTE — Unmapped (Signed)
Pt reports having a 24 hr spell 5 days ago where she felt dizzy and people couldn't understand what she was saying, fell with +head strike, -LOC. Episode. Hx of diabetes, hypertension,AKI.  -Stroke assessment

## 2023-12-16 ENCOUNTER — Inpatient Hospital Stay: Admit: 2023-12-16 | Payer: PRIVATE HEALTH INSURANCE

## 2023-12-16 DIAGNOSIS — N1831 Chronic kidney disease, stage 3a: Secondary | ICD-10-CM | POA: Insufficient documentation

## 2023-12-16 DIAGNOSIS — R42 Dizziness and giddiness: Secondary | ICD-10-CM | POA: Insufficient documentation

## 2023-12-16 LAB — COMPREHENSIVE METABOLIC PANEL
ALBUMIN: 2.5 g/dL — ABNORMAL LOW (ref 3.4–5.0)
ALKALINE PHOSPHATASE: 341 U/L — ABNORMAL HIGH (ref 46–116)
ALT (SGPT): 33 U/L (ref 10–49)
ANION GAP: 14 mmol/L (ref 5–14)
AST (SGOT): 72 U/L — ABNORMAL HIGH (ref <=34)
BILIRUBIN TOTAL: 5.5 mg/dL — ABNORMAL HIGH (ref 0.3–1.2)
BLOOD UREA NITROGEN: 29 mg/dL — ABNORMAL HIGH (ref 9–23)
BUN / CREAT RATIO: 26
CALCIUM: 9.9 mg/dL (ref 8.7–10.4)
CHLORIDE: 89 mmol/L — ABNORMAL LOW (ref 98–107)
CO2: 25.4 mmol/L (ref 20.0–31.0)
CREATININE: 1.1 mg/dL — ABNORMAL HIGH (ref 0.55–1.02)
EGFR CKD-EPI (2021) FEMALE: 58 mL/min/{1.73_m2} — ABNORMAL LOW (ref >=60)
GLUCOSE RANDOM: 153 mg/dL (ref 70–179)
POTASSIUM: 4.4 mmol/L (ref 3.4–4.8)
PROTEIN TOTAL: 9 g/dL — ABNORMAL HIGH (ref 5.7–8.2)
SODIUM: 128 mmol/L — ABNORMAL LOW (ref 135–145)

## 2023-12-16 LAB — CBC
HEMATOCRIT: 29.6 % — ABNORMAL LOW (ref 34.0–44.0)
HEMOGLOBIN: 9.5 g/dL — ABNORMAL LOW (ref 11.3–14.9)
MEAN CORPUSCULAR HEMOGLOBIN CONC: 31.9 g/dL — ABNORMAL LOW (ref 32.0–36.0)
MEAN CORPUSCULAR HEMOGLOBIN: 23.4 pg — ABNORMAL LOW (ref 25.9–32.4)
MEAN CORPUSCULAR VOLUME: 73.3 fL — ABNORMAL LOW (ref 77.6–95.7)
MEAN PLATELET VOLUME: 7.8 fL (ref 6.8–10.7)
PLATELET COUNT: 151 10*9/L (ref 150–450)
RED BLOOD CELL COUNT: 4.04 10*12/L (ref 3.95–5.13)
RED CELL DISTRIBUTION WIDTH: 21.4 % — ABNORMAL HIGH (ref 12.2–15.2)
WBC ADJUSTED: 4.8 10*9/L (ref 3.6–11.2)

## 2023-12-16 LAB — PROTIME-INR
INR: 1.26
PROTIME: 14.4 s — ABNORMAL HIGH (ref 9.9–12.6)

## 2023-12-16 MED ADMIN — scopolamine (TRANSDERM-SCOP) 1 mg over 3 days topical patch 1 mg: 1 | TOPICAL | @ 15:00:00 | Stop: 2023-12-16

## 2023-12-16 MED ADMIN — empagliflozin (JARDIANCE) tablet 25 mg: 25 mg | ORAL | @ 14:00:00

## 2023-12-16 MED ADMIN — spironolactone (ALDACTONE) tablet 100 mg: 100 mg | ORAL | @ 16:00:00

## 2023-12-16 MED ADMIN — carvedilol (COREG) tablet 6.25 mg: 6.25 mg | ORAL | @ 14:00:00

## 2023-12-16 MED ADMIN — pantoprazole (Protonix) EC tablet 40 mg: 40 mg | ORAL | @ 14:00:00

## 2023-12-16 MED ADMIN — magnesium oxide (MAG-OX) tablet 800 mg: 800 mg | ORAL | @ 14:00:00

## 2023-12-16 MED ADMIN — lactulose oral solution: 20 g | ORAL | @ 21:00:00 | Stop: 2023-12-16

## 2023-12-16 MED ADMIN — lactulose oral solution: 20 g | ORAL | @ 17:00:00 | Stop: 2023-12-16

## 2023-12-16 MED ADMIN — lactulose oral solution: 20 g | ORAL | @ 11:00:00 | Stop: 2023-12-16

## 2023-12-16 MED ADMIN — insulin lispro (HumaLOG) injection 0-20 Units: 0-20 [IU] | SUBCUTANEOUS | @ 13:00:00

## 2023-12-16 MED ADMIN — rifAXIMin (XIFAXAN) tablet 550 mg: 550 mg | ORAL | @ 14:00:00 | Stop: 2023-12-30

## 2023-12-16 MED ADMIN — furosemide (LASIX) tablet 40 mg: 40 mg | ORAL | @ 14:00:00

## 2023-12-16 MED ADMIN — buPROPion (Wellbutrin XL) 24 hr tablet 150 mg: 150 mg | ORAL | @ 14:00:00

## 2023-12-16 MED ADMIN — insulin lispro (HumaLOG) injection 0-20 Units: 0-20 [IU] | SUBCUTANEOUS | @ 17:00:00

## 2023-12-16 MED ADMIN — insulin glargine (LANTUS) injection 36 Units: 36 [IU] | SUBCUTANEOUS | @ 08:00:00

## 2023-12-16 MED ADMIN — insulin lispro (HumaLOG) injection 0-20 Units: 0-20 [IU] | SUBCUTANEOUS | @ 23:00:00

## 2023-12-16 MED ADMIN — lidocaine (ASPERCREME) 4 % 1 patch: 1 | TRANSDERMAL | @ 16:00:00 | Stop: 2023-12-16

## 2023-12-16 NOTE — Unmapped (Signed)
Family Medicine Inpatient Service  Progress Note    Team: Family [facility] (pgr [phone])    Hospital Day: 1    ASSESSMENT / PLAN:   Ashley Briggs is a 58 y.o. female with a PMH of T2DM, CAD, decompensated NASH cirrhosis w/ esophageal varices and hx of hepatic encephalopathy, calcified cerebral meningioma, hyperlipidemia, hypertension, psoriasis who presents with confusion, dizziness, facial numbness in the past week.      # Dizziness  Ongoing for the past month with acute worsening 4 days ago. Has documented report of these symptoms in the past (01/2023). Also documented syncopal episodes in last admission in December 2024. Physical exam did not appreciate any nystagmus. Dizziness could be secondary to low PO intake as patient notes she has inconsistent eating. Last A1c 7.9 (significant improvement from 10.3 4 months ago.). Dizziness could be attributed to low or highs in blood pressure as BG on presentation was 324. Low concern for worsening of mengioma as CT scan showed stable appearance. Low concern for thyroid element to dizziness as TSH is stable.  Orthostatic were negative at last visit. Has follow up neurology appointment on Jan 17th for dizziness and headaches.  Cautious continuation of sedating medications.  - Fall precautions  - Nutrition consult  - Consider adding Folate level  - Orthostatics  - Hold home baclofen and gabapentin   - Resume home baclofen   - Resume gabapentin at lower dose 300 mg nightly  - Has scheduled neuro appointment on 1/17  - PT/OT - SNF vs home with home health  - Consider MRI if symptoms worsen  - Consider discontinuing home meds that may be contributing (gabapentin, scopolamine, ambien, coreg)     # Esophageal varices  # Cirrhosis  With ammonia 21 low concern that patient has hepatic encephalopathy contributing to her dizziness.  Previously managed on December 2024 concerning for cirrhosis.  MELD 3.0: 23. MELD-Na: 22.  Afebrile with reassuring vitals.  RSV flu COVID-negative.  Lactate 1.7.  U-Tox negative for illicit substances.  Alcohol level was unremarkable.  Low concern for cardiac etiology as troponins negative.  - Daily PT/INR  - Daily CBC  - Daily CMP  - Rifaximin 550 mg twice daily  - Scopolamine patch for nausea  - Lactulose 20 g 4 times daily     # Type 2 diabetes  Last A1c 7.9. Improved from 10 four months ago.   - ACHS  - SSI  - Home regime of 36 units lantus nigtly.  - Jardiance 25 mg daily     # Mild hyperkalemia  K4.9 on presentation  -A.m. BMP  - Holding home spironolactone  - resumed spironolactone at lower dose 100 mg daily     # Elevated creatinine  Baseline of creatinine is 0.9-1.  Creatinine on January 6 was 1.29.  Creatinine ED presentation was 1.10.  Does not meet criteria for AK I   - Continue to monitor     # Hyperlipidemia  -Pravastatin 40 mg nightly     # Hypertension  - Resume Coreg 6.25 mg BID     # Insomnia  -Mirtazapine 7.5 mg nightly    # HME  Bedside commode: The patient is confined to one room or The patient is confined to one level of the home with no toilet on that level or The patient is confined to a home with no toilet facilities in the home     Rolling Walker: The beneficiary has a mobility limitation that significantly impairs his/her  ability to participate in one or more mobility related activities of daily living in the home. A mobility limitation prevents the beneficiary from accomplishing the MRADL entirely. The beneficiary is able to safely use the walker.       # Checklist:  - IVF None  - Daily labs needed: CBC and CMP  - Diet Regular  - Bowel Regimen:  On home lactulose  - DVT: None, anticipate short stay  - Code Status:   Orders Placed This Encounter   Procedures    Full Code     Standing Status:   Standing     Number of Occurrences:   1     - Dispo: Floor    [ ]  Anticipated Discharge Location: Home with home health  [ ]  PT/OT/DME: PT/OT ordered  [ ]  CM/SW needs: Likely pending PT/OT recs  [ ]  Follow up appt: Appointment needed    SUBJECTIVE:  Interval events: NAOEN.      REVIEW OF SYSTEMS:  Pertinent positives and negatives per HPI. A complete review of systems otherwise negative.    PHYSICAL EXAM:      Intake/Output Summary (Last 24 hours) at 12/16/2023 0655  Last data filed at 12/16/2023 0400  Gross per 24 hour   Intake --   Output 1625 ml   Net -1625 ml       Recent Vitals:  Vitals:    12/16/23 0400   BP: 110/62   Pulse: 73   Resp: 13   Temp: 37.3 ??C (99.1 ??F)   SpO2: 94%       GEN: well appearing, lying in bed, NAD   HEENT: NCAT, No scleral icterus. Conjunctiva non-erythematous. MMM.  CV: Regular rate and rhythm. No murmurs/rubs/gallops.  Pulm: Normal work of breathing on room air. CTAB. No wheezing, crackles, or rhonchi.  Abd: Flat.  Nontender. No guarding, rebound.  Normoactive bowel sounds.    Neuro: A&O x 3. No focal deficits. No asterixis.   Ext: No peripheral edema.  Palpable distal pulses.  Skin: Jaundice.      LABS/ STUDIES:    All imaging, laboratory studies, and other pertinent tests including electrocardiography within the last 24 hours were reviewed and are summarized within the assessment and plan.     NUTRITION:                           Nestor Lewandowsky, MD, PGY1  December 16, 2023 6:55 AM

## 2023-12-16 NOTE — Unmapped (Addendum)
Care Management  Initial Transition Planning Assessment              General  Care Manager assessed the patient by : In person interview with patient, Discussion with Clinical Care team, Medical record review  Orientation Level: Oriented X4  Functional level prior to admission: Independent  Reason for referral: Discharge Planning, Home Health    Contact/Decision Maker  Extended Emergency Contact Information  Primary Emergency Contact: Caudle,Ryan  Mobile Phone: [phone]  Relation: Relative    Legal Next of Kin / Guardian / POA / Advance Directives     HCDM (patient stated preference): Caudle,Ryan - Relative - [phone]    Advance Directive (Medical Treatment)  Does patient have an advance directive covering medical treatment?: Patient does not have advance directive covering medical treatment.  Reason patient does not have an advance directive covering medical treatment:: Patient does not wish to complete one at this time.    Health Care Decision Maker [HCDM] (Medical & Mental Health Treatment)  Healthcare Decision Maker: Patient does not wish to appoint a Health Care Decision Maker at this time  Information offered on HCDM, Medical & Mental Health advance directives:: Patient declined information.    Advance Directive (Mental Health Treatment)  Does patient have an advance directive covering mental health treatment?: Patient does not have advance directive covering mental health treatment.  Reason patient does not have an advance directive covering mental health treatment:: Patient does not wish to complete one at this time.    Readmission Information    Have you been hospitalized in the last 30 days?: No    Patient Information  Lives with: Family members    Type of Residence: Private residence        Location/Detail: [address] [place], [place] [address] phone [phone]    Support Systems/Concerns: Family Members    Responsibilities/Dependents at home?: No    Home Care services in place prior to admission?: No          Outpatient/Community Resources in place prior to admission: Clinic  Agency detail (Name/Phone #): PCP [facility]    Equipment Currently Used at Home: cane, straight       Currently receiving outpatient dialysis?: No       Financial Information       Need for financial assistance?: No       Social Determinants of Health  Social Determinants of Health were addressed in provider documentation.  Please refer to patient history.  Social Drivers of Health     Food Insecurity: No Food Insecurity (12/16/2023)    Hunger Vital Sign     Worried About Running Out of Food in the Last Year: Never true     Ran Out of Food in the Last Year: Never true   Internet Connectivity: No Internet connectivity concern identified (09/17/2022)    Internet Connectivity     Do you have access to internet services: Yes     How do you connect to the internet: Personal Device at home     Is your internet connection strong enough for you to watch video on your device without major problems?: Yes     Do you have enough data to get through the month?: Yes     Does at least one of the devices have a camera that you can use for video chat?: Yes   Housing/Utilities: Low Risk  (12/16/2023)    Housing/Utilities     Within the past 12 months, have  you ever stayed: outside, in a car, in a tent, in an overnight shelter, or temporarily in someone else's home (i.e. couch-surfing)?: No     Are you worried about losing your housing?: No     Within the past 12 months, have you been unable to get utilities (heat, electricity) when it was really needed?: No   Tobacco Use: Medium Risk (12/16/2023)    Patient History     Smoking Tobacco Use: Never     Smokeless Tobacco Use: Never     Passive Exposure: Current   Transportation Needs: No Transportation Needs (12/16/2023)    PRAPARE - Transportation     Lack of Transportation (Medical): No     Lack of Transportation (Non-Medical): No   Alcohol Use: Not At Risk (01/09/2021)    Alcohol Use How often do you have a drink containing alcohol?: Monthly or less     How many drinks containing alcohol do you have on a typical day when you are drinking?: 1 - 2     How often do you have 5 or more drinks on one occasion?: Never   Interpersonal Safety: Not At Risk (09/17/2022)    Interpersonal Safety     Unsafe Where You Currently Live: No     Physically Hurt by Anyone: No     Abused by Anyone: No   Physical Activity: Insufficiently Active (01/09/2021)    Exercise Vital Sign     Days of Exercise per Week: 1 day     Minutes of Exercise per Session: 40 min   Intimate Partner Violence: Not At Risk (01/09/2021)    Humiliation, Afraid, Rape, and Kick questionnaire     Fear of Current or Ex-Partner: No     Emotionally Abused: No     Physically Abused: No     Sexually Abused: No   Stress: Stress Concern Present (09/17/2022)    [employer] of Occupational Health - Occupational Stress Questionnaire     Feeling of Stress : Very much   Substance Use: Not on file (10/06/2023)   Social Connections: Moderately Isolated (09/02/2018)    Received from [facility], [facility]    Social Connection and Isolation Panel [NHANES]     Frequency of Communication with Friends and Family: Once a week     Frequency of Social Gatherings with Friends and Family: Twice a week     Attends Religious Services: Never     Database administrator or Organizations: No     Attends Banker Meetings: Never     Marital Status: Widowed   Physicist, medical Strain: Low Risk  (12/16/2023)    Overall Financial Resource Strain (CARDIA)     Difficulty of Paying Living Expenses: Not very hard   Depression: Not at risk (07/12/2021)    PHQ-2     PHQ-2 Score: 0   Health Literacy: Low Risk  (01/09/2021)    Health Literacy     : Never       Complex Discharge Information    Is patient identified as a difficult/complex discharge?: No    Discharge Needs Assessment  Concerns to be Addressed: discharge planning, adjustment to diagnosis/illness, home safety    Clinical Risk Factors: Multiple Diagnoses (Chronic), Functional Limitations, History of Falls, Readmission < 30 Days    Barriers to taking medications: No ([facility] [address] [place])    Prior overnight hospital stay or ED visit in last 90 days: Yes        Anticipated  Changes Related to Illness: none    Equipment Needed After Discharge: commode chair, walker, rolling    Discharge Facility/Level of Care Needs: other (see comments) (HH vs. SNF)    Readmission  Risk of Unplanned Readmission Score: UNPLANNED READMISSION SCORE: 27.44%  Predictive Model Details          27% (High)  Factor Value    Calculated 12/16/2023 12:05 22% Number of ED visits in last six months 5    [place] Risk of Unplanned Readmission Model 21% Number of active inpatient medication orders 39     12% Number of hospitalizations in last year 3     7% ECG/EKG order present in last 6 months     6% Charlson Comorbidity Index 7     6% Latest BUN high (29 mg/dL)     6% Diagnosis of electrolyte disorder present     5% Imaging order present in last 6 months     5% Latest hemoglobin low (9.5 g/dL)     4% Age 25     3% Latest creatinine high (1.10 mg/dL)     2% Future appointment scheduled     1% Active ulcer inpatient medication order present     1% Current length of stay 0.623 days      Readmitted Within the Last 30 Days? (No if blank) Yes  Patient at risk for readmission?: Yes    Discharge Plan  Screen findings are: Discharge planning needs identified or anticipated (Comment). (patient agreeable to home health)    Expected Discharge Date:     Expected Transfer from Critical Care:      Quality data for continuing care services shared with patient and/or representative?: Yes  Patient and/or family were provided with choice of facilities / services that are available and appropriate to meet post hospital care needs?: Yes       Initial Assessment complete?: Yes

## 2023-12-16 NOTE — Unmapped (Shared)
[facility]  Emergency Department Provider Note    ED Clinical Impression     Final diagnoses:   None       Initial Impression, ED Course, Assessment and Plan     Ashley Briggs is a 58 y.o. female with a PMH of T2DM, CAD, decompensated NASH cirrhosis w/ esophageal varices and hx of hepatic encephalopathy, calcified cerebral meningioma, hyperlipidemia, hypertension, psoriasis who presents with 4 days of room-spinning dizziness, posterior headaches, and bilateral facial numbness following an episode of dizziness, confusion, blurred vision, and facial numbness 4 days ago that resulted in a fall with head strike. No LOC.    Vitals WNL. On exam, NAD. Scleral icterus and mild jaundice. Fine intention tremor. Mild psychomotor retardation. Slightly dry mucous membranes. 1/6 systolic murmur best heard over left lower sternal border. Abdomen distended w/ positive fluid wave, non-tender. Varicose veins on LLE. No calf tenderness. No overlying skin changes. Trace bilateral lower extremity edema. Equal peripheral pulses. Neuro exam grossly intact except for as noted above. Mild tremor, but no asterixis.     Concern for acute on chronic liver failure with associated hepatic encephalopathy. Lower suspicion for TIA or CVA, ICH, meningitis. Also consider metabolic derangement or delirium in the setting of UTI.     CT head from triage resulted w/o acute abnormalities. Laboratory studies significant for hyponatremia to 129, mild hyperkalemia to 4.9, hypochloremia to 90, hyperglycemia to 324, hypoalbuminemia to 2.7, and hyperbilirubinemia to 5.5. Otherwise, no leukocytosis or abnormal kidney function, HGB at 9.4 is near baseline. Troponin negative. Plan to obtain additional labs, UA, Utox, and 4plex. Also plan for admission for hepatic encephalopathy.    Discussion of Management with other Physicians, QHP or Appropriate Source: Admitting team  Independent Interpretation of Studies: I have independently reviewed CT head and note ***.  External Records Reviewed: Patient's most recent discharge summary and Patient's most recent outpatient clinic note (Fam Med Note 12/07/23 & Discharge Summary 11/24/23 for history)  Escalation of Care, Consideration of Admission/Observation/Transfer: Admission for management of hepatic encephalopathy.  Social determinants that significantly affected care: None applicable  Prescription drug(s) considered but not prescribed: ***  Diagnostic tests considered but not performed: ***  History obtained from other sources: None    ____________________________________________    Time seen: December 15, 2023 4:49 PM       History     Chief Complaint  Dizziness and Fall      HPI   Ashley Briggs is a 58 y.o. female with a PMH of T2DM, CAD, decompensated NASH cirrhosis w/ esophageal varices and hx of hepatic encephalopathy, calcified cerebral meningioma, hyperlipidemia, hypertension, psoriasis who presents to the ED for evaluation of dizziness and a fall. Patient reports that she had an episode of room-spinning dizziness 4 days ago alongside confusion. She notes that her symptoms gradually onset, though a family member that was present during the episode reported to her that symptoms appeared to rapidly onset. She states that she was unable to understand what other individuals were saying during this episode. She endorses associated bilateral blurred vision and bilateral facial numbness. She has headaches along the posterior head frequently at baseline; this has increased in intensity since the dizzy spell occurred. Currently, she reports that her dizziness, headache, and facial numbness are still present. Otherwise, her other symptoms from the symptomatic episode 4 days ago have resolved. She did sustain a fall during the episode four days ago as well in which she struck her head. No LOC.  She has loose stool regularly at baseline that she describes as light brown in color; she has not noticed any blood in her stool. Her urination has been regular recently. No hematuria or dysuria. Denies chest pain, palpations, abdominal pain, abnormal nausea, vomiting, vaginal bleeding, vaginal discharge, or lower extremity swelling. No known sick contacts. No history of stroke. Of note, the patient was recently admitted from 11/20/23-11/24/23 for dizziness and confusion, found to have profound hyperkalemia and an AKI. She was treated with Albuterol, insulin, calcium gluconate, and Lokelma with improvement in her potassium. Her home Lasix and spironolactone were then gradually resumed and her creatinine steadily improved throughout admission towards baseline. Additionally, the patient started scopolamine patches approximately 1 week ago for the nausea that she experiences at baseline. She states that she has been tolerating this well. She has taken Tylenol for lower back pain recently.     Past Medical History:   Diagnosis Date    Anemia     Angular blepharoconjunctivitis of both eyes 12/21/2020    Anxiety     Arthritis     Ascites     Blepharitis     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    Cataract associated with type 2 diabetes mellitus (CMS-HCC) 12/21/2020    Chronic headaches     Chronic pain     Chronic pain disorder 2029    I have multiple issues. 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 type 2 diabetes mellitus (CMS-HCC) 12/02/2022    Dry eye syndrome, bilateral 12/21/2020    Dysphagia 02/02/2023    Endometrial cancer (CMS-HCC) 2007    Esophageal varices in cirrhosis (CMS-HCC) 06/17/2022    Fractures 01/29/2018    Gastroparesis     Generalized pruritus 02/02/2023    GERD (gastroesophageal reflux disease) It's been going on for years    I take medication  which helps tremendously    Glaucoma 2023    I see a doctor on a regular basis    H/O diabetic gastroparesis 01/09/2021    Heart disease     Hemorrhage of rectum and anus 03/17/2011    Hepatic cirrhosis (CMS-HCC) 06/17/2022    Hepatic encephalopathy (CMS-HCC) 06/17/2022    Hepatic steatosis 03/13/2021    Noted on CT Fall 2021    High cholesterol     Hx of psoriasis 08/06/2022    Hyperlipidemia 12/12/2015    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    Liver disease     Major depression     Metabolic dysfunction-associated steatohepatitis (MASH)     Morbid obesity (CMS-HCC) 11/10/2019    Myopia of both eyes with astigmatism and presbyopia 12/02/2022    Nausea 09/22/2022    Non-alcoholic fatty liver disease     NPDR (nonproliferative diabetic retinopathy) (CMS-HCC)     Right eye    Other ascites 09/22/2022    Other chronic pain 11/06/2022    Other insomnia 11/06/2022    PFD (pelvic floor dysfunction) 04/07/2013    Portal hypertension (CMS-HCC) 10/08/2022    Psoriasis     Ptosis of eyelid, left     Left upper eyelid    Reflux     Retinopathy of left eye,  background, proliferative 01/09/2021    Right upper quadrant abdominal pain 09/22/2022    Secondary esophageal varices without bleeding (CMS-HCC) 06/17/2022    Type 2 diabetes mellitus, with long-term current use of insulin (CMS-HCC) 12/12/2015    Last Assessment & Plan:   The current medical regimen is effective;  continue present plan and medications.  Patient will continue good management of diabetes especially with weight loss. Hopefully with further weight loss will run into the problem of being overmedicated and having to cut back on medications     Some confusion on diabetes medications was have not.been refilled for over a year.  Pa       Past Surgical History:   Procedure Laterality Date    CHOLECYSTECTOMY      HYSTERECTOMY      endometrial cancer    HYSTERECTOMY      OOPHORECTOMY      OTHER SURGICAL HISTORY  Hysterectomy was in 2007. Gallbladder was in 1999 and phrenectomy  was in 2024    Hysterectomy,  gallbladder removed and phrenectomy    PR ANAL PRESSURE RECORD Left 03/31/2013    Procedure: ANORECTAL MANOMETRY;  Surgeon: None None;  [facility];  Service: Gastroenterology    PR BREATH HYDROGEN TEST N/A 03/31/2013    Procedure: BREATH HYDROGEN TEST;  Surgeon: None None;  [facility]: [facility];  Service: Gastroenterology    PR COLSC FLX W/RMVL OF TUMOR POLYP LESION SNARE TQ N/A 09/06/2020    Procedure: COLONOSCOPY FLEX; W/REMOV TUMOR/LES BY SNARE;  Surgeon: Chriss Driver, MD;  [facility]: [facility];  Service: Gastroenterology    PR UPPER GI ENDOSCOPY,BIOPSY N/A 04/15/2013    Procedure: UGI ENDOSCOPY; WITH BIOPSY, SINGLE OR MULTIPLE;  Surgeon: Vickii Chafe, MD;  [facility]: [facility];  Service: Gastroenterology    PR UPPER GI ENDOSCOPY,BIOPSY N/A 09/06/2020    Procedure: UGI ENDOSCOPY; WITH BIOPSY, SINGLE OR MULTIPLE;  Surgeon: Chriss Driver, MD;  [facility];  Service: Gastroenterology    PR UPPER GI ENDOSCOPY,BIOPSY N/A 05/05/2022    Procedure: UGI ENDOSCOPY; WITH BIOPSY, SINGLE OR MULTIPLE;  Surgeon: Chriss Driver, MD;  [facility];  Service: Gastroenterology    PR UPPER GI ENDOSCOPY,BIOPSY N/A 01/22/2023    Procedure: UGI ENDOSCOPY; WITH BIOPSY, SINGLE OR MULTIPLE;  Surgeon: Vonda Antigua, MD;  [facility]: [facility];  Service: Gastroenterology       No current facility-administered medications for this encounter.    Current Outpatient Medications:     acetaminophen (TYLENOL) 325 MG tablet, Take 2 tablets (650 mg total) by mouth two (2) times a day as needed for pain., Disp: 180 tablet, Rfl: 1    aspirin (ECOTRIN) 81 MG tablet, Take 1 tablet (81 mg total) by mouth daily., Disp: 90 tablet, Rfl: 3    baclofen (LIORESAL) 5 mg Tab tablet, Increase to 10mg  nightly., Disp: 60 tablet, Rfl: 11    blood sugar diagnostic Strp, by Other route Three (3) times a day before meals. For glucometer brand preferred by patient, Disp: 100 strip, Rfl: 6    blood-glucose meter,continuous (DEXCOM G7 RECEIVER) Misc, Use as instructed with G7 sensors., Disp: 1 each, Rfl: 1    blood-glucose sensor (DEXCOM G7 SENSOR) Devi, Use to monitor blood glucose levels continuously. Change sensor every 10 days., Disp: 9 each, Rfl: 3    blood-glucose sensor (FREESTYLE LIBRE 3 SENSOR) Devi, Use 1 sensor every 15 days, Disp:  6 each, Rfl: 3    buPROPion (WELLBUTRIN XL) 150 MG 24 hr tablet, Take 1 tablet (150 mg total) by mouth daily., Disp: , Rfl:     canagliflozin (INVOKANA) 300 mg Tab tablet, Take 1 tablet (300 mg total) by mouth daily before breakfast., Disp: 90 tablet, Rfl: 0    carvedilol (COREG) 6.25 MG tablet, Take 1 tablet (6.25 mg total) by mouth two (2) times a day., Disp: 60 tablet, Rfl: 11    chlorhexidine (PERIDEX) 0.12 % solution, 15 mL by Mouth route daily., Disp: , Rfl:     cholestyramine (QUESTRAN) 4 gram packet, Dissolve as directed and take 1 packet by mouth daily., Disp: 30 each, Rfl: 0    clotrimazole (MYCELEX) 10 mg troche, Take 1 tablet (10 mg total) by mouth five (5) times a day for 14 days., Disp: 70 tablet, Rfl: 0    empty container Misc, Use as directed to dispose of Cosentyx pens., Disp: 1 each, Rfl: 2    ferrous sulfate (SLOW FE) 142 mg (45 mg iron) TbER, Take 1 tablet (142 mg) by mouth two (2) times a day., Disp: 60 tablet, Rfl: 3    furosemide (LASIX) 40 MG tablet, Take 1 tablet (40 mg total) by mouth two (2) times a day., Disp: 60 tablet, Rfl: 11    gabapentin (NEURONTIN) 300 MG capsule, Take 1 capsule (300 mg total) by mouth Three (3) times a day., Disp: 90 capsule, Rfl: 0    insulin glargine (BASAGLAR, LANTUS) 100 unit/mL (3 mL) injection pen, Inject 0.36 mL (36 Units total) under the skin nightly., Disp: 15 mL, Rfl: 3    insulin lispro (HUMALOG) 100 unit/mL injection pen, Inject 8 Units under the skin Three (3) times a day before meals., Disp: 15 mL, Rfl: 3    lactulose 10 gram/15 mL solution, Take 30 mL (20 g total) by mouth four (4) times a day., Disp: 3600 mL, Rfl: 0    lancets (ACCU-CHEK SOFTCLIX LANCETS) Misc, Use as directed to test once daily., Disp: 100 each, Rfl: 1    lidocaine HCL-menthoL (NERVIVE PAIN RELIEVING) 4-1 % srlo, Apply topically., Disp: , Rfl:     magnesium oxide (MAG-OX) 400 mg (241.3 mg elemental magnesium) tablet, Take 2 tablets (800 mg total) by mouth two (2) times a day., Disp: 120 tablet, Rfl: 11    metFORMIN (GLUCOPHAGE-XR) 500 MG 24 hr tablet, Take 1 tablet (500 mg total) by mouth Two (2) times a day (30 minutes before a meal)., Disp: 360 tablet, Rfl: 0    mirtazapine (REMERON) 7.5 MG tablet, Take 1 tablet (7.5 mg total) by mouth., Disp: , Rfl:     naltrexone (DEPADE) 50 mg tablet, Take 1 tablet (50 mg total) by mouth daily. Start with half a table (25 mg) a day for 2 weeks before increasing to a full tablet (50 mg) a day, Disp: 30 tablet, Rfl: 11    pantoprazole (PROTONIX) 40 MG tablet, Take 1 tablet (40 mg total) by mouth two (2) times a day., Disp: 180 tablet, Rfl: 1    pen needle, diabetic (ULTICARE PEN NEEDLE) 32 gauge x 1/4 (6 mm) Ndle, Inject 1 each under the skin nightly., Disp: 100 each, Rfl: 1    rifAXIMin (XIFAXAN) 550 mg Tab, Take 1 tablet (550 mg total) by mouth two (2) times a day., Disp: 60 tablet, Rfl: 11    scopolamine (TRANSDERM-SCOP) 1 mg over 3 days, Place 1 patch (1 mg total) on the skin every third day., Disp:  10 patch, Rfl: 0    secukinumab (COSENTYX PEN) 150 mg/mL PnIj injection, Inject the contents of 2 pens (300 mg total) under the skin once a week at weeks 0, 1, 2, 3, and 4. THEN inject the contents of 2 pens (300 mg total) every 4 weeks thereafter., Disp: 10 mL, Rfl: 0    secukinumab (COSENTYX PEN, 2 PENS,) 150 mg/mL PnIj injection, Inject the contents of 2 pens (300 mg total) under the skin every twenty-eight (28) days. Maintenance dose, Disp: 2 mL, Rfl: 5    simvastatin (ZOCOR) 20 MG tablet, Take 1 tablet (20 mg total) by mouth every evening., Disp: 90 tablet, Rfl: 0    spironolactone (ALDACTONE) 100 MG tablet, Take 1 tablet (100 mg total) by mouth two (2) times a day., Disp: 60 tablet, Rfl: 11    zolpidem (AMBIEN) 5 MG tablet, Take 1 tablet (5 mg total) by mouth nightly as needed., Disp: , Rfl:     Allergies  Patient has no known allergies.    Family History   Problem Relation Age of Onset    Diabetes Mother     Cancer Mother         Complications during dialysis    Depression Mother     Hypertension Mother     Vision loss Mother     No Known Problems Father     No Known Problems Brother     Diabetes Maternal Grandmother     Hypertension Maternal Grandmother     Alcohol abuse Maternal Grandmother         Heavy drinker    Arthritis Maternal Grandmother     Cancer Maternal Grandfather     Diabetes Maternal Grandfather     Alcohol abuse Maternal Grandfather     Heart disease Maternal Grandfather     No Known Problems Paternal Grandmother     Skin cancer Paternal Grandfather     Stroke Paternal Grandfather     Vision loss Maternal Aunt         Heart Attack    No Known Problems Maternal Uncle     No Known Problems Paternal Aunt     No Known Problems Paternal Uncle     No Known Problems Other     Melanoma Neg Hx     Basal cell carcinoma Neg Hx     Squamous cell carcinoma Neg Hx     Substance Abuse Disorder Neg Hx     Drug abuse Neg Hx     Mental illness Neg Hx     Amblyopia Neg Hx     Blindness Neg Hx     Cataracts Neg Hx     Glaucoma Neg Hx     Macular degeneration Neg Hx     Retinal detachment Neg Hx     Strabismus Neg Hx     Thyroid disease Neg Hx     Breast cancer Neg Hx        Social History  Social History     Tobacco Use    Smoking status: Never     Passive exposure: Current    Smokeless tobacco: Never   Vaping Use    Vaping status: Never Used   Substance Use Topics    Alcohol use: Not Currently Comment: No longer    Drug use: Not Currently     Types: Marijuana, Codeine, Hydrocodone, Morphine, Oxycodone       Review of Systems  A 10 point review of systems was performed  and negative except as noted in the history of present illness.    Physical Exam     VITAL SIGNS:    ED Triage Vitals   Enc Vitals Group      BP 12/15/23 1546 100/47      Heart Rate 12/15/23 1546 80      SpO2 Pulse --       Resp 12/15/23 1544 16      Temp 12/15/23 1544 36.8 ??C (98.2 ??F)      Temp Source 12/15/23 1544 Oral      SpO2 12/15/23 1546 100 %      Weight 12/15/23 1544 89.4 kg (197 lb)     Constitutional: Alert and oriented. Well appearing and in no distress.  Eyes: Scleral icterus.  ENT       Head: Normocephalic and atraumatic.       Nose: No congestion.       Mouth/Throat: Slightly dry mucous membranes.        Neck: No stridor, neck is supple.  Hematological/Lymphatic/Immunilogical: No cervical lymphadenopathy.  Cardiovascular: 1/6 systolic murmur best heard over left lower sternal border. Normal rate, regular rhythm. Normal and symmetric distal pulses are present in all extremities.   Respiratory: Normal respiratory effort. Breath sounds are normal. No wheezes, rales, rhonchi.  Gastrointestinal: Abdomen distended w/ positive fluid wave, non-tender. There is no CVA tenderness.  Musculoskeletal: Trace bilateral lower extremity edema. Nontender with normal range of motion in all extremities.       Right lower leg: No tenderness.       Left lower leg: Varicose veins. No calf tenderness. No overlying skin changes. No tenderness.  Neurologic: Fine intention tremor. Mild psychomotor retardation. Normal speech and language. Mild tremor, but no asterixis. Cranial nerves 2-12, strength, sensation to light touch, reflexes, and cerebellar function intact.   Skin: Mild jaundice. Skin is warm, dry and intact. No rash noted.  Psychiatric: Mood and affect are normal. Speech and behavior are normal.    EKG     ***    Radiology     CT Head Wo Contrast   Final Result   No acute intracranial abnormality.      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                            Procedures     N/A    Pertinent labs & imaging results that were available during my care of the patient were reviewed by me and considered in my medical decision making (see chart for details).    Documentation assistance was provided by Cherly Hensen, Scribe, on December 15, 2023 at 4:49 PM for Idalia Needle, MD.    {*** note to provider: please use .[email or web address] to enter a scribe attestation}

## 2023-12-16 NOTE — Unmapped (Signed)
PHYSICAL THERAPY  Evaluation (12/16/23 0856)          Patient Name:  Ashley Briggs       Medical Record Number: [id number]   Date of Birth: 20-Sep-1966  Sex: Female        Post-Discharge Physical Therapy Recommendations:  PT Post Acute Discharge Recommendations: Skilled PT services indicated, 5x weekly, Low intensity (vs 3x with increased cg assist.)   Equipment Recommendation  PT DME Recommendations: Defer to post acute          Treatment Diagnosis: Unsteadiness on feet  Treatment Diagnosis: 'eval and treat'     Activity Tolerance: Other (limited by dizziness)     ASSESSMENT  Problem List: Impaired balance, Dizziness/vertigo, Decreased mobility, Gait deviation, Fall risk, Decreased endurance, Pain, Decreased range of motion, Decreased strength, Impaired vision      Assessment : Ashley Briggs is a 58 y.o. female with a PMH of T2DM, CAD, decompensated NASH cirrhosis w/ esophageal varices and hx of hepatic encephalopathy, calcified cerebral meningioma, hyperlipidemia, hypertension, psoriasis who presents with confusion, dizziness, facial numbness in the past week. Patient reports baseline independence with functional mobility and ADL's using her SPC. She reports 2 falls in the past 2 -3 months, the most recent with head strike and leading to this hospitalization. Pt reports worsening dizziness and blurry vision x 1 week before admission. She lives with her cousin, who has MD, and is unable to provide physical assist.     Patient currently presents to PT below her baseline with dizziness and pain that is significantly impacting her functional mobility. She c/o dizziness at rest and with activity. Smooth pursuits intact although L eye fatigues quickly.Vertebral artery check  is negative, proceeded with VOR assessment, but pt unable to tolerate formal testing d/t increased dizziness and guarding with any head rotation. Pt is mod I with bed mobs, supervision for seated balance, and CGA with RW for transfers to stand and ambulation x 5 ft near EOB. The patient closes her eyes with activity and needs verbal cues to keep them open. In addition to dizziness, she has pain and limited ROM at R shoulder, unable to reach or tolerate shoulder flexion beyond 90 degrees. She has R buttock pain, but no radicular symptoms. TTP at glut and piriformis. Instructions for pillow between LE's when sleeping in preferred L sidelying position. Pt verbalizes understanding. MD updated on exam findings. Patient's 6 item raw AMPAC score is 18/24 with patient presenting as 40.47% impaired with basic functional mobility.  After review of contributing co-morbidities and personal factors, clinical presentation and exam findings, patient demonstrates moderate complexity for evaluation and development of POC. Without physical assist at home for mobility and given severity of deficits this date, would recommend 5xlow vs 3x with increased cg assist at home. Will continue acute PT working to address impairments and goals.      Today's Interventions: Balance activities, Gait training, Therapeutic activity, Patient/Family/Caregiver Education  Today's Interventions: PT eval, bed mobs, transfers, ambulation with RW, vestibular assessment, pt ed: pain management with positioning, POC, fall prevention, dc recs     Personal Factors/Comorbidities Present: 3+   Examination of Body System: Musculoskeletal, Cardiovascular, Pulmonary, Neurological, Activity/participation, Integumentary  Clinical Presentation: Evolving    Clinical Decision Making: Moderate        PLAN  Planned Frequency of Treatment: Plan of Care Initiated: 12/16/23  1-2x per day Weekly Frequency: 4-5 days per week  Planned Treatment Duration: 12/30/23     Planned Interventions: Education (Patient/Family/Caregiver),  Gait training, Home exercise program, Self-care / Home Management training, Therapeutic Exercise, Therapeutic Activity, Modalities, Neuromuscular re-education     Goals:   Patient and Family Goals: decreased pain, decreased dizziness     SHORT GOAL #1: Pt will perform all functional transfers mod I with LRAD               Time Frame : 2 weeks  SHORT GOAL #2: Patient will ambulate 133ft with LRAD, mod I              Time Frame : 2 weeks  SHORT GOAL #3: Pt will ascend/descend 5 stairs independently with B handrails and/or LRAD              Time Frame : 2 weeks                                           Long Term Goal #1: Pt will improve to 24/24 AMPAC in 4 weeks  Time Frame: 4 weeks     Prognosis:  Fair  Positive Indicators: PLOF, willingness to participate  Barriers to Discharge: Impaired Balance, Decreased caregiver support, Endurance deficits, Functional strength deficits, Gait instability, Severity of deficits, Pain, Visual impairments, Inability to safely perform ADLS     SUBJECTIVE  Communication Preference: Verbal,     Patient reports: Agreeable to PT, willing to try despite dizziness        Prior Functional Status: Pt lives with a cousin who has MD and is unable to physically assist her. At baseline, pt is independent with functional mobility and ADL's, using SPC for balance. Recent increase to dizziness x 1 week and fall with headstrike while trying to go the bathroom  Equipment available at home: Straight cane        Past Medical History:   Diagnosis Date    Anemia     Angular blepharoconjunctivitis of both eyes 12/21/2020    Anxiety     Arthritis     Ascites     Blepharitis     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    Cataract associated with type 2 diabetes mellitus (CMS-HCC) 12/21/2020    Chronic headaches     Chronic pain     Chronic pain disorder 2029    I have multiple issues. 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 type 2 diabetes mellitus (CMS-HCC) 12/02/2022    Dry eye syndrome, bilateral 12/21/2020    Dysphagia 02/02/2023    Endometrial cancer (CMS-HCC) 2007    Esophageal varices in cirrhosis (CMS-HCC) 06/17/2022    Fractures 01/29/2018    Gastroparesis     Generalized pruritus 02/02/2023    GERD (gastroesophageal reflux disease) It's  been going on for years    I take medication  which helps tremendously    Glaucoma 2023    I see a doctor on a regular basis    H/O diabetic gastroparesis 01/09/2021    Heart disease     Hemorrhage of rectum and anus 03/17/2011    Hepatic cirrhosis (CMS-HCC) 06/17/2022    Hepatic encephalopathy (CMS-HCC) 06/17/2022  Hepatic steatosis 03/13/2021    Noted on CT Fall 2021    High cholesterol     Hx of psoriasis 08/06/2022    Hyperlipidemia 12/12/2015    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    Liver disease     Major depression     Metabolic dysfunction-associated steatohepatitis (MASH)     Morbid obesity (CMS-HCC) 11/10/2019    Myopia of both eyes with astigmatism and presbyopia 12/02/2022    Nausea 09/22/2022    Non-alcoholic fatty liver disease     NPDR (nonproliferative diabetic retinopathy) (CMS-HCC)     Right eye    Other ascites 09/22/2022    Other chronic pain 11/06/2022    Other insomnia 11/06/2022    PFD (pelvic floor dysfunction) 04/07/2013    Portal hypertension (CMS-HCC) 10/08/2022    Psoriasis     Ptosis of eyelid, left     Left upper eyelid    Reflux     Retinopathy of left eye, background, proliferative 01/09/2021    Right upper quadrant abdominal pain 09/22/2022    Secondary esophageal varices without bleeding (CMS-HCC) 06/17/2022    Type 2 diabetes mellitus, with long-term current use of insulin (CMS-HCC) 12/12/2015    Last Assessment & Plan:   The current medical regimen is effective;  continue present plan and medications.  Patient will continue good management of diabetes especially with weight loss. Hopefully with further weight loss will run into the problem of being overmedicated and having to cut back on medications     Some confusion on diabetes medications was have not.been refilled for over a year.  Pa            Social History     Tobacco Use    Smoking status: Never     Passive exposure: Current    Smokeless tobacco: Never   Substance Use Topics    Alcohol use: Not Currently     Comment: No longer       Past Surgical History:   Procedure Laterality Date    CHOLECYSTECTOMY      HYSTERECTOMY      endometrial cancer    HYSTERECTOMY      OOPHORECTOMY      OTHER SURGICAL HISTORY  Hysterectomy was in 2007. Gallbladder was in 1999 and phrenectomy  was in 2024    Hysterectomy,  gallbladder removed and phrenectomy    PR ANAL PRESSURE RECORD Left 03/31/2013    Procedure: ANORECTAL MANOMETRY;  Surgeon: None None;  [facility];  Service: Gastroenterology    PR BREATH HYDROGEN TEST N/A 03/31/2013    Procedure: BREATH HYDROGEN TEST;  Surgeon: None None;  [facility];  Service: Gastroenterology    PR COLSC FLX W/RMVL OF TUMOR POLYP LESION SNARE TQ N/A 09/06/2020    Procedure: COLONOSCOPY FLEX; W/REMOV TUMOR/LES BY SNARE;  Surgeon: Chriss Driver, MD;  Location: [facility];  Service: Gastroenterology    PR UPPER GI ENDOSCOPY,BIOPSY N/A 04/15/2013    Procedure: UGI ENDOSCOPY; WITH BIOPSY, SINGLE OR MULTIPLE;  Surgeon: Vickii Chafe, MD;  [facility];  Service: Gastroenterology    PR UPPER GI ENDOSCOPY,BIOPSY N/A 09/06/2020    Procedure: UGI ENDOSCOPY; WITH BIOPSY, SINGLE OR MULTIPLE;  Surgeon: Chriss Driver, MD;  [facility];  Service: Gastroenterology    PR UPPER GI ENDOSCOPY,BIOPSY N/A 05/05/2022    Procedure: UGI ENDOSCOPY; WITH BIOPSY, SINGLE OR MULTIPLE;  Surgeon: Chriss Driver, MD;  [facility];  Service: Gastroenterology    PR UPPER GI ENDOSCOPY,BIOPSY N/A 01/22/2023    Procedure: UGI ENDOSCOPY; WITH BIOPSY, SINGLE OR MULTIPLE;  Surgeon: Vonda Antigua, MD;  [facility];  Service: Gastroenterology             Family History   Problem Relation Age of Onset    Diabetes Mother     Cancer Mother         Complications during dialysis    Depression Mother     Hypertension Mother     Vision loss Mother     No Known Problems Father     No Known Problems Brother     Diabetes Maternal Grandmother     Hypertension Maternal Grandmother     Alcohol abuse Maternal Grandmother         Heavy drinker    Arthritis Maternal Grandmother     Cancer Maternal Grandfather     Diabetes Maternal Grandfather     Alcohol abuse Maternal Grandfather     Heart disease Maternal Grandfather     No Known Problems Paternal Grandmother     Skin cancer Paternal Grandfather     Stroke Paternal Grandfather     Vision loss Maternal Aunt         Heart Attack    No Known Problems Maternal Uncle     No Known Problems Paternal Aunt     No Known Problems Paternal Uncle     No Known Problems Other     Melanoma Neg Hx     Basal cell carcinoma Neg Hx     Squamous cell carcinoma Neg Hx     Substance Abuse Disorder Neg Hx     Drug abuse Neg Hx     Mental illness Neg Hx     Amblyopia Neg Hx     Blindness Neg Hx     Cataracts Neg Hx     Glaucoma Neg Hx     Macular degeneration Neg Hx     Retinal detachment Neg Hx     Strabismus Neg Hx     Thyroid disease Neg Hx     Breast cancer Neg Hx         Allergies: Patient has no known allergies.                  Objective Findings  Precautions / Restrictions  Precautions: Falls precautions  Weight Bearing Status: Non-applicable  Required Braces or Orthoses: Non-applicable     Pain Comments: 10/10 R shoulder/arm pain and R buttock pain, RN aware, MD notified. Recommended pillow between LE's if L sidelying. Pt verbalizes understanding.  Medical Tests / Procedures: Reviewed in EPIC  Equipment / Environment: Vascular access (PIV, TLC, Port-a-cath, PICC), Telemetry     Vitals/Orthostatics : BP 113/46, pulse 84 (occasional PVC's noted on monitor), SpO2 96% on room air. NAD. Dizziness (room spinning or 'disorientation' feeling) at rest and with activity.     Living Situation  Living Environment: Trailer/Mobile home  Lives With: Other (cousin)  Home Living: Stairs to enter with rails, Walk-in shower, Standard height toilet, One level home  Rail placement (outside): Bilateral rails in reach  Number of Stairs to Enter (outside): 5  Caregiver Identified?: Yes (cousin)  Caregiver Availability: 24 hours  Caregiver Ability: Supervision (cousin has MD and would be unable to assist pt physically)      Cognition: WFL, Follows 2-step  commands  Cognition comment: alert, able to answer questions appropriately  Visual/Perception: Wears Glasses/Contacts (wears bifocals but does not have them at the hospital. along with c/o dizziness x 1 week pt also reports blurry vision)  Hearing: No deficit identified     Skin Inspection: Bruising  Skin Inspection comment: psoriasis plaques noted at low back and L hip region. bruising to upper T spine region     Upper Extremities  UE ROM: Left WFL, Right Impaired/Limited  RUE ROM Impairment: Limited PROM, Limited AROM, Pain with movement  UE Strength: Left WFL, Right Impaired/Limited  RUE Strength Impairment: Reduced strength, Pain with movement  UE comment: RUE with h/o proximal humerus fx. Limited AROM/PROM but pt reports this is worse than normal. c/o pain 10/10.Marland Kitchen Distally 3+/5. R handed at baseline.    Lower Extremities  LE ROM: Left WFL, Right Impaired/Limited  RLE ROM Impairment: Pain with movement  LE Strength: Right Impaired/Limited, Left Impaired/Limited  RLE Strength Impairment: Pain with movement, Reduced strength  LLE Strength Impairment: Reduced strength  LE comment: R weaker than L, 4- to 4/5          Coordination: Not tested  Proprioception: Not tested  Sensation: Impaired (Reports baseline neuropathy at hands and feet, but intact to light touch sensation during testing today)  Posture:  (closes eyes in standing)    Static Sitting-Level of Assistance: Supervision  Dynamic Sitting-Level of Assistance: Supervision  Sitting Balance comments: supervision d/t c/o dizziness    Static Standing-Level of Assistance: Contact guard  Dynamic Standing - Level of Assistance: Contact guard  Standing Balance comments: CGA at RW, pt needed frequent cues to keep eyes open, given dizziness and pt closing eyes, CGA for safety.      Bed Mobility: Supine to Sit  Supine to Sit assistance level: Modified independent, requires aide device or extra time  Bed Mobility comments: Supine<>sit EOB with mod I, HOB elevated and use of bedrails     Transfers: Sit to Stand  Sit to Stand assistance level: Contact guard assist, steadying assist  Transfer comments: Sit<>Stand with CGA at RW. Able to push with LUE from bed, but unable to push with RUE, keeping it on RW handle instead.      Gait Level of Assistance: Contact guard assist, steadying assist  Gait Assistive Device: Rolling walker  Gait Distance Ambulated (ft): 5 ft  Skilled Treatment Performed: 14ft with RW, CGA, limited by c/o dizziness and eyes closing with any standing activity. Short step length. Decreased foot clearance     Stairs: n/t            Endurance: limited by dizziness and pain    Patient at end of session: All needs in reach, In bed, Lines intact, Nurse notified, Staff present    Physical Therapy Session Duration  PT Individual [mins]: 39          AM-PAC-6 click  Help currently need turning over In bed?: None - Modified Independent/Independent  Help currently needed sitting down/standing up from chair with arms? : A Little - Minimal/Contact Guard Assist/Supervision  Help currently needed moving from supine to sitting on edge of bed?: None - Modified Independent/Independent  Help currently needed moving to and from bed from wheelchair?: A Little - Minimal/Contact Guard Assist/Supervision  Help currently needed walking in a hospital room?: A Little - Minimal/Contact Guard Assist/Supervision  Help currently needed climbing 3-5 steps with railing?: Unable to do/total assistance - Total Dependent Assist    Basic Mobility Score 6 click:  18    6 click Score (in points): % of Functional Impairment, Limitation, Restriction  6: 100% impaired, limited, restricted  7-8: At least 80%, but less than 100% impaired, limited restricted  9-13: At least 60%, but less than 80% impaired, limited restricted  14-19: At least 40%, but less than 60% impaired, limited restricted  20-22: At least 20%, but less than 40% impaired, limited restricted  23: At least 1%, but less than 20% impaired, limited restricted  24: 0% impaired, limited restricted        I attest that I have reviewed the above information.  Signed: Garey Ham, PT  Filed 12/16/2023

## 2023-12-16 NOTE — Unmapped (Addendum)
Pt is alert and oriented x4, worked with PT and OT, assisted to the Bedside commode as needed.Pt complains of pain on her Rt buttock, MD made aware and pt was given Lidocaine patch per MD. Encouraged pt to call Rn when needed to ambulate to bedside commode.      Report called to [id number] at 1300. Report given to Vivien Presto RN. Pt is explained about the transfer.

## 2023-12-16 NOTE — Unmapped (Addendum)
[ ]   F/up BMP    Ashley Briggs is a 58 y.o. female with a PMH of T2DM, CAD, decompensated NASH cirrhosis w/ esophageal varices and hx of hepatic encephalopathy, calcified cerebral meningioma, hyperlipidemia, hypertension, psoriasis who presents with confusion, dizziness, facial numbness in the past week.      # Hepatic encephalopathy  # Polypharmacy  # Hypovolemia  # Confusion - Dizziness  Ongoing for the past month with acute worsening 4 days ago. Has documented report of these symptoms in the past (01/2023). Also documented syncopal episodes in last admission in December 2024. Low concern for worsening of mengioma as CT scan showed stable appearance. Low concern for thyroid element to dizziness as TSH is stable. May be secondary to over diuresis given low Na and BP. May be a component of hepatic encephalopathy since pt was only having 3 BM's per day with home dose of lactulose. Her symptoms improved with an additional dose of lactulose. Polypharmacy may also be contributing to symptoms. Recommend discharging on lactulose 30 g QID with goal 4-5 BM per day. Decreased Lasix to 40 mg daily. Reduced gabapentin to 300 mg nightly. Discontinued scopolamine. Reduced spironolactone 100 mg daily. Reduced baclofen 5 mg daily. PT/OT recommending home health.    # Hyponateremia  Likely secondary to over diuresis. Improved with 250 mg fluid bolus and holding diuretics. Decreased Lasix to 40 mg daily.

## 2023-12-16 NOTE — Unmapped (Signed)
Family Medicine Inpatient Service  History and Physical Note    Team: [facility] [phone])  PCP: Karie Georges Pap, DO  Date of Admission: December 15, 2023  Code Status: full code  Emergency Contact: Caudle,Ryan (Relative)  [phone]     ASSESSMENT / PLAN:   Ashley Briggs is a 58 y.o. female with a PMH of T2DM, CAD, decompensated NASH cirrhosis w/ esophageal varices and hx of hepatic encephalopathy, calcified cerebral meningioma, hyperlipidemia, hypertension, psoriasis who presents with       # Dizziness  Ongoing for the past month with acute worsening 4 days ago. Has documented report of these symptoms in the past (01/2023). Also documented syncopal episodes in last admission in December 2024. Physical exam did not appreciate any nystagmus. Dizziness could be secondary to low PO intake as patient notes she has inconsistent eating. Last A1c 7.9 (significant improvement from 10.3 4 months ago.). Dizziness could be attributed to low or highs in blood pressure as BG on presentation was 324. Low concern for worsening of mengioma as CT scan showed stable appearance. Low concern for thyroid element to dizziness as TSH is stable.  Orthostatic were negative at last visit. Has follow up neurology appointment on Jan 17th for dizziness and headaches.  Cautious continuation of sedating medications.  - Fall precautions  - Nutrition consult  - Consider adding Folate level  - Orthostatics  - Hold home baclofen and gabapentin    # Esophageal varices  # Cirrhosis  With ammonia 21 low concern that patient has hepatic encephalopathy contributing to her dizziness.  Previously managed on December 2024 concerning for cirrhosis.  Low indication to start lactulose at this time.  MELD 3.0: 23. MELD-Na: 22.  Afebrile with reassuring vitals.  RSV flu COVID-negative.  Lactate 1.7.  U-Tox negative for illicit substances.  Alcohol level was unremarkable.  Low concern for cardiac etiology as troponins negative.  - Daily PT/INR  - Daily CBC  - Daily CMP  - Rifaximin 550 mg twice daily  - Scopolamine patch for nausea  - Lactulose 20 g 4 times daily    # Type 2 diabetes  Last A1c 7.9. Improved from 10 four months ago.   - ACHS  - SSI  - Home regime of 36 units lantus nigtly.  - Jardiance 25 mg daily    # Mild hyperkalemia  K4.9 on presentation  -A.m. BMP  - Holding home spironolactone    # Elevated creatinine  Baseline of creatinine is 0.9-1.  Creatinine on January 6 was 1.29.  Creatinine ED presentation was 1.10.  Does not meet criteria for AK I   - Continue to monitor    # Hyperlipidemia  -Pravastatin 40 mg nightly    # Hypertension  -Holding home Coreg due to soft blood pressures    # Insomnia  -Mirtazapine 7.5 mg nightly    # FEN/GI:  - IVF none  - Check electrolytes as indicated, replete as needed.  - Diet Regular    # PPX:   - DVT: Padua score 1. Low risk    # Dispo: Floor  [ ]  Anticipated Discharge Location: Home  [ ]  PT/OT/DME: No needs anticipated  [ ]  CM/SW needs: None anticipated  [ ]  Teaching: None anticipated    HISTORY OF PRESENT ILLNESS:  Ashley Briggs is a 58 y.o. female with a PMH of T2DM, CAD, decompensated NASH cirrhosis w/ esophageal varices and hx of hepatic encephalopathy, calcified cerebral meningioma, hyperlipidemia, hypertension, psoriasis who presents with  4 days of room-spinning dizziness, posterior headaches, and bilateral facial numbness following an episode of dizziness, confusion, blurred vision, and facial numbness 4 days ago that resulted in a fall with head strike. No LOC.     Dizziness relieved or worsened with activity. She notes the dizziness has been present for about a month or longer and has acute worsened in the past 4 days without an inciting event. Present on while lying down. Denies chest pain, SOB, abdominal pain, abdominal distention, ear pain.     Upon presentation to the ED blood pressure slightly hypotensive 100/47 but otherwise within normal limits.    ED workup: Mobile cell phone 0.1, hemoglobin 9.4 sodium 129, potassium 4.9, creatinine 110, glucose 324, bilirubin of 5.5 AST 67, ALT 34, alk phos 316, lipase 115.  Troponin is negative.  PT 13.9 INR 1.22, ammonia 21    CT head without contrast did not show any acute intracranial abnormalities.      PAST MEDICAL / SURGICAL HX:  Past Medical History:   Diagnosis Date    Anemia     Angular blepharoconjunctivitis of both eyes 12/21/2020    Anxiety     Arthritis     Ascites     Blepharitis     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    Cataract associated with type 2 diabetes mellitus (CMS-HCC) 12/21/2020    Chronic headaches     Chronic pain     Chronic pain disorder 2029    I have multiple issues. 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 type 2 diabetes mellitus (CMS-HCC) 12/02/2022    Dry eye syndrome, bilateral 12/21/2020    Dysphagia 02/02/2023    Endometrial cancer (CMS-HCC) 2007    Esophageal varices in cirrhosis (CMS-HCC) 06/17/2022    Fractures 01/29/2018    Gastroparesis     Generalized pruritus 02/02/2023    GERD (gastroesophageal reflux disease) It's  been going on for years    I take medication  which helps tremendously    Glaucoma 2023    I see a doctor on a regular basis    H/O diabetic gastroparesis 01/09/2021    Heart disease     Hemorrhage of rectum and anus 03/17/2011    Hepatic cirrhosis (CMS-HCC) 06/17/2022    Hepatic encephalopathy (CMS-HCC) 06/17/2022    Hepatic steatosis 03/13/2021    Noted on CT Fall 2021    High cholesterol     Hx of psoriasis 08/06/2022    Hyperlipidemia 12/12/2015    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    Liver disease     Major depression     Metabolic dysfunction-associated steatohepatitis (MASH)     Morbid obesity (CMS-HCC) 11/10/2019    Myopia of both eyes with astigmatism and presbyopia 12/02/2022    Nausea 09/22/2022    Non-alcoholic fatty liver disease     NPDR (nonproliferative diabetic retinopathy) (CMS-HCC)     Right eye    Other ascites 09/22/2022    Other chronic pain 11/06/2022    Other insomnia 11/06/2022    PFD (pelvic floor dysfunction) 04/07/2013    Portal hypertension (CMS-HCC) 10/08/2022    Psoriasis     Ptosis of eyelid,  left     Left upper eyelid    Reflux     Retinopathy of left eye, background, proliferative 01/09/2021    Right upper quadrant abdominal pain 09/22/2022    Secondary esophageal varices without bleeding (CMS-HCC) 06/17/2022    Type 2 diabetes mellitus, with long-term current use of insulin (CMS-HCC) 12/12/2015    Last Assessment & Plan:   The current medical regimen is effective;  continue present plan and medications.  Patient will continue good management of diabetes especially with weight loss. Hopefully with further weight loss will run into the problem of being overmedicated and having to cut back on medications     Some confusion on diabetes medications was have not.been refilled for over a year.  Pa     Past Surgical History:   Procedure Laterality Date    CHOLECYSTECTOMY      HYSTERECTOMY      endometrial cancer    HYSTERECTOMY      OOPHORECTOMY      OTHER SURGICAL HISTORY  Hysterectomy was in 2007. Gallbladder was in 1999 and phrenectomy  was in 2024    Hysterectomy,  gallbladder removed and phrenectomy    PR ANAL PRESSURE RECORD Left 03/31/2013    Procedure: ANORECTAL MANOMETRY;  Surgeon: None None;  [facility];  Service: Gastroenterology    PR BREATH HYDROGEN TEST N/A 03/31/2013    Procedure: BREATH HYDROGEN TEST;  Surgeon: None None;  Location: [facility];  Service: Gastroenterology    PR COLSC FLX W/RMVL OF TUMOR POLYP LESION SNARE TQ N/A 09/06/2020    Procedure: COLONOSCOPY FLEX; W/REMOV TUMOR/LES BY SNARE;  Surgeon: Chriss Driver, MD;  Location: [facility];  Service: Gastroenterology    PR UPPER GI ENDOSCOPY,BIOPSY N/A 04/15/2013    Procedure: UGI ENDOSCOPY; WITH BIOPSY, SINGLE OR MULTIPLE;  Surgeon: Vickii Chafe, MD;  Location: [facility];  Service: Gastroenterology    PR UPPER GI ENDOSCOPY,BIOPSY N/A 09/06/2020    Procedure: UGI ENDOSCOPY; WITH BIOPSY, SINGLE OR MULTIPLE;  Surgeon: Chriss Driver, MD;  Location: [facility];  Service: Gastroenterology    PR UPPER GI ENDOSCOPY,BIOPSY N/A 05/05/2022    Procedure: UGI ENDOSCOPY; WITH BIOPSY, SINGLE OR MULTIPLE;  Surgeon: Chriss Driver, MD;  [facility];  Service: Gastroenterology    PR UPPER GI ENDOSCOPY,BIOPSY N/A 01/22/2023    Procedure: UGI ENDOSCOPY; WITH BIOPSY, SINGLE OR MULTIPLE;  Surgeon: Vonda Antigua, MD;  [facility];  Service: Gastroenterology       FAMILY HX:   Family History   Problem Relation Age of Onset    Diabetes Mother     Cancer Mother         Complications during dialysis    Depression Mother     Hypertension Mother     Vision loss Mother     No Known Problems Father     No Known Problems Brother     Diabetes Maternal Grandmother     Hypertension Maternal Grandmother     Alcohol abuse Maternal Grandmother         Heavy drinker    Arthritis Maternal Grandmother     Cancer Maternal Grandfather     Diabetes Maternal Grandfather     Alcohol abuse Maternal Grandfather     Heart disease Maternal Grandfather     No Known Problems Paternal Grandmother     Skin cancer Paternal Grandfather  Stroke Paternal Grandfather     Vision loss Maternal Aunt         Heart Attack    No Known Problems Maternal Uncle     No Known Problems Paternal Aunt     No Known Problems Paternal Uncle     No Known Problems Other     Melanoma Neg Hx     Basal cell carcinoma Neg Hx     Squamous cell carcinoma Neg Hx     Substance Abuse Disorder Neg Hx     Drug abuse Neg Hx     Mental illness Neg Hx     Amblyopia Neg Hx     Blindness Neg Hx     Cataracts Neg Hx     Glaucoma Neg Hx     Macular degeneration Neg Hx     Retinal detachment Neg Hx     Strabismus Neg Hx     Thyroid disease Neg Hx     Breast cancer Neg Hx        SOCIAL HX:   Social History     Socioeconomic History    Marital status: Widowed   Tobacco Use    Smoking status: Never     Passive exposure: Current    Smokeless tobacco: Never   Vaping Use    Vaping status: Never Used   Substance and Sexual Activity    Alcohol use: Not Currently     Comment: No longer    Drug use: Not Currently     Types: Marijuana, Codeine, Hydrocodone, Morphine, Oxycodone    Sexual activity: Not Currently     Partners: Male     Birth control/protection: Abstinence   Other Topics Concern    Do you use sunscreen? Yes    Tanning bed use? No    Are you easily burned? Yes    Excessive sun exposure? No    Blistering sunburns? Yes     Comment: When I was in my teens and twenties   Social History Narrative    Widowed    No children    Not currently working:  Previously worked as a Engineer, site     Social Drivers of Psychologist, prison and probation services Strain: Low Risk  (08/18/2023)    Overall Financial Resource Strain (CARDIA)     Difficulty of Paying Living Expenses: Not very hard   Food Insecurity: No Food Insecurity (08/18/2023)    Hunger Vital Sign     Worried About Running Out of Food in the Last Year: Never true     Ran Out of Food in the Last Year: Never true   Transportation Needs: No Transportation Needs (08/18/2023)    PRAPARE - Therapist, art (Medical): No     Lack of Transportation (Non-Medical): No   Physical Activity: Insufficiently Active (01/09/2021)    Exercise Vital Sign     Days of Exercise per Week: 1 day     Minutes of Exercise per Session: 40 min   Stress: Stress Concern Present (09/17/2022)    [employer] of Occupational Health - Occupational Stress Questionnaire Feeling of Stress : Very much   Social Connections: Moderately Isolated (09/02/2018)    Received from [facility]    Social Connection and Isolation Panel [NHANES]     Frequency of Communication with Friends and Family: Once a week     Frequency of Social Gatherings with Friends and Family: Twice a week  Attends Religious Services: Never     Active Member of Clubs or Organizations: No     Attends Banker Meetings: Never     Marital Status: Widowed       MEDICATIONS / ALLERGIES:  Medications Prior to Admission   Medication Sig Dispense Refill Last Dose/Taking    acetaminophen (TYLENOL) 325 MG tablet Take 2 tablets (650 mg total) by mouth two (2) times a day as needed for pain. 180 tablet 1     aspirin (ECOTRIN) 81 MG tablet Take 1 tablet (81 mg total) by mouth daily. 90 tablet 3     baclofen (LIORESAL) 5 mg Tab tablet Increase to 10mg  nightly. 60 tablet 11     blood sugar diagnostic Strp by Other route Three (3) times a day before meals. For glucometer brand preferred by patient 100 strip 6     blood-glucose meter,continuous (DEXCOM G7 RECEIVER) Misc Use as instructed with G7 sensors. 1 each 1     blood-glucose sensor (DEXCOM G7 SENSOR) Devi Use to monitor blood glucose levels continuously. Change sensor every 10 days. 9 each 3     blood-glucose sensor (FREESTYLE LIBRE 3 SENSOR) Devi Use 1 sensor every 15 days 6 each 3     buPROPion (WELLBUTRIN XL) 150 MG 24 hr tablet Take 1 tablet (150 mg total) by mouth daily.       canagliflozin (INVOKANA) 300 mg Tab tablet Take 1 tablet (300 mg total) by mouth daily before breakfast. 90 tablet 0     carvedilol (COREG) 6.25 MG tablet Take 1 tablet (6.25 mg total) by mouth two (2) times a day. 60 tablet 11     chlorhexidine (PERIDEX) 0.12 % solution 15 mL by Mouth route daily.       cholestyramine (QUESTRAN) 4 gram packet Dissolve as directed and take 1 packet by mouth daily. 30 each 0     [EXPIRED] clotrimazole (MYCELEX) 10 mg troche Take 1 tablet (10 mg total) by mouth five (5) times a day for 14 days. 70 tablet 0     empty container Misc Use as directed to dispose of Cosentyx pens. 1 each 2     ferrous sulfate (SLOW FE) 142 mg (45 mg iron) TbER Take 1 tablet (142 mg) by mouth two (2) times a day. 60 tablet 3     furosemide (LASIX) 40 MG tablet Take 1 tablet (40 mg total) by mouth two (2) times a day. 60 tablet 11     gabapentin (NEURONTIN) 300 MG capsule Take 1 capsule (300 mg total) by mouth Three (3) times a day. 90 capsule 0     insulin glargine (BASAGLAR, LANTUS) 100 unit/mL (3 mL) injection pen Inject 0.36 mL (36 Units total) under the skin nightly. 15 mL 3     insulin lispro (HUMALOG) 100 unit/mL injection pen Inject 8 Units under the skin Three (3) times a day before meals. 15 mL 3     lactulose 10 gram/15 mL solution Take 30 mL (20 g total) by mouth four (4) times a day. 3600 mL 0     lancets (ACCU-CHEK SOFTCLIX LANCETS) Misc Use as directed to test once daily. 100 each 1     lidocaine HCL-menthoL (NERVIVE PAIN RELIEVING) 4-1 % srlo Apply topically.       magnesium oxide (MAG-OX) 400 mg (241.3 mg elemental magnesium) tablet Take 2 tablets (800 mg total) by mouth two (2) times a day. 120 tablet 11     metFORMIN (GLUCOPHAGE-XR) 500 MG  24 hr tablet Take 1 tablet (500 mg total) by mouth Two (2) times a day (30 minutes before a meal). 360 tablet 0     mirtazapine (REMERON) 7.5 MG tablet Take 1 tablet (7.5 mg total) by mouth.       naltrexone (DEPADE) 50 mg tablet Take 1 tablet (50 mg total) by mouth daily. Start with half a table (25 mg) a day for 2 weeks before increasing to a full tablet (50 mg) a day 30 tablet 11     pantoprazole (PROTONIX) 40 MG tablet Take 1 tablet (40 mg total) by mouth two (2) times a day. 180 tablet 1     pen needle, diabetic (ULTICARE PEN NEEDLE) 32 gauge x 1/4 (6 mm) Ndle Inject 1 each under the skin nightly. 100 each 1     rifAXIMin (XIFAXAN) 550 mg Tab Take 1 tablet (550 mg total) by mouth two (2) times a day. 60 tablet 11 scopolamine (TRANSDERM-SCOP) 1 mg over 3 days Place 1 patch (1 mg total) on the skin every third day. 10 patch 0     secukinumab (COSENTYX PEN) 150 mg/mL PnIj injection Inject the contents of 2 pens (300 mg total) under the skin once a week at weeks 0, 1, 2, 3, and 4. THEN inject the contents of 2 pens (300 mg total) every 4 weeks thereafter. 10 mL 0     secukinumab (COSENTYX PEN, 2 PENS,) 150 mg/mL PnIj injection Inject the contents of 2 pens (300 mg total) under the skin every twenty-eight (28) days. Maintenance dose 2 mL 5     simvastatin (ZOCOR) 20 MG tablet Take 1 tablet (20 mg total) by mouth every evening. 90 tablet 0     spironolactone (ALDACTONE) 100 MG tablet Take 1 tablet (100 mg total) by mouth two (2) times a day. 60 tablet 11     zolpidem (AMBIEN) 5 MG tablet Take 1 tablet (5 mg total) by mouth nightly as needed.          No Known Allergies    IMMUNIZATIONS:  Immunization History   Administered Date(s) Administered    COVID-19 VAC,BIVALENT(36YR UP),PFIZER 06/16/2022    COVID-19 VACCINE,MRNA(MODERNA)(PF) 05/30/2020, 06/28/2020    Covid-19 Vac, (60yr+) (Spikevax) Monovalent Moderna 10/07/2022, 09/08/2023    Hep A / Hep B 07/08/2022, 08/08/2022, 01/27/2023    INFLUENZA INJ MDCK PF, QUAD,(FLUCELVAX)(55MO AND UP EGG FREE) 08/22/2020, 09/12/2021    INFLUENZA VACCINE IIV3(IM)(PF)6 MOS UP 08/31/2023, 09/13/2023    Influenza Vaccine Quad(IM)6 MO-Adult(PF) 09/09/2017, 08/20/2018, 11/10/2019, 11/10/2019, 08/06/2022, 08/06/2022    Influenza Virus Vaccine, unspecified formulation 09/12/2016, 09/10/2017    PNEUMOCOCCAL POLYSACCHARIDE 23-VALENT 11/10/2019    PPD Test 10/17/2019    Pneumococcal Conjugate 20-valent 06/16/2022    SHINGRIX-ZOSTER VACCINE (HZV),RECOMBINANT,ADJUVANTED(IM) 11/06/2022, 01/27/2023    TdaP 03/11/2007, 05/23/2020, 05/24/2021       REVIEW OF SYSTEMS:  Pertinent positives and negatives per HPI. A complete review of systems otherwise negative.    PHYSICAL EXAM:    Initial ED Vitals:   ED Triage Vitals   Enc Vitals Group      BP 12/15/23 1546 100/47      Heart Rate 12/15/23 1546 80      SpO2 Pulse 12/15/23 1803 75      Resp 12/15/23 1544 16      Temp 12/15/23 1544 36.8 ??C (98.2 ??F)      Temp Source 12/15/23 1544 Oral      SpO2 12/15/23 1546 100 %      Weight 12/15/23  1544 89.4 kg (197 lb)      Height --       Head Circumference --       Peak Flow --       Pain Score --       Pain Loc --       Pain Education --       Exclude from Growth Chart --        Recent Vitals:  Vitals:    12/15/23 2149   BP: 114/49   Pulse: 73   Resp: 20   Temp: 37.2 ??C (99 ??F)   SpO2: 100%       GEN: Well-appearing, lying in bed, NAD   Eyes: PERRL. No scleral icterus. Conjunctiva non-erythematous. EOMI.  HEENT: NCAT  Neck: Supple.  CV: Regular rate and rhythm. No murmurs/rubs/gallops. No costochondral tenderness. No cyanosis or clubbing.  Pulm: CTAB. No wheezing, crackles, or rhonchi.  Abd: Flat.  Nontender. No guarding, rebound.  Normoactive bowel sounds.    Neuro: A&O x 3. No focal deficits. Tremors. No nystagmus appreciated   Ext: No peripheral edema.  Palpable distal pulses.  Varicosity noted in left lower extremity  Skin: No rashes or skin lesions.       LABS/ STUDIES:  All imaging, laboratory studies, and other pertinent tests including electrocardiography were reviewed prior to admission and are summarized within the assessment and plan.     Rudie Meyer, MD,  PGY2  December 15, 2023 8:40 PM

## 2023-12-16 NOTE — Unmapped (Signed)
OCCUPATIONAL THERAPY  Evaluation (12/16/23 1020)    Patient Name:  Ashley Briggs       Medical Record Number: [id number]     Date of Birth: 1966/09/19  Sex: Female      Post-Discharge Occupational Therapy Recommendations: 5x weekly, Low intensity (vs 3x with supervision/assist for all mobility/transfers)          Equipment Recommendation  OT DME Recommendations: Defer to post acute (If Pt returns home, BSC and RW)       OT Treatment Diagnosis: Unsteadiness on feet, Generalized muscle weakness    OT eval and treat    Assessment  Problem List: Dizziness/vertigo, Decreased strength, Decreased mobility, Impaired ADLs, Decreased endurance, Impaired balance, Impaired sensation, Fall risk, Decreased range of motion        Clinical Decision Making: Moderate Complexity    Assessment: Ashley Briggs is a 58 y.o. female with a PMH of T2DM, CAD, decompensated NASH cirrhosis w/ esophageal varices and hx of hepatic encephalopathy, calcified cerebral meningioma, hyperlipidemia, hypertension, psoriasis who presents with dizziness and subsequent fall.         At baseline, Pt independent with all BADLs and IADLs, ambulates with a straight cane.         Pt supine in bed upon OT arrival and educated on the role of OT and OT evaluation in the acute care setting with Pt agreeable to session. Pt presents to acute OT with the above-mentioned conditions and below baseline function level with ADLs and functional transfers on this date, with Pt completing: bed mobility supine <> sitting EOB with SBA; LB dressing with supervision seated EOB; UB dressing simulation seated EOB with min A 2/2 decreased AROM RUE; grooming simulation seated EOB with SBA; sit <> stand with CGA using RW; ambulated ~3-4 steps with CGA using RW while transferring to [facility] positioned at end of bed; t/f to [facility] with CGA using RW; toileting tasks deferred. Pt participated in visual tracking, visual fixation, and cervical ROM with no changes in baseline dizziness which Pt rates 8/10 in intensity throughout session regardless of positioning or mobility.  Observed deficits, including: dizziness, decreased functional mobility, decreased balance, decreased functional strength, impaired sensation, and pain all currently impacting Pt's independence and safety with ADLs and functional transfers compared with baseline.  Pt will benefit from skilled acute OT services to address observed deficits and maximize independence and safety with ADLs. Recommend 5xLow (vs 3x with supervision/assist for all functional mobility) post-acute. DME: Defer to post-acute (If Pt returns home, BSC and RW).     After review of the patient's occupational profile and history, assessment of occupational performance, clinical decision making, and development of POC, the patient presents as a Moderate complexity case.    Today's Interventions: ADL retraining, Balance activities, Bed mobility, Compensatory tech. training, Conservation, Functional mobility, Education - Patient, Postular / Proximal stability, Range of motion, Transfer training, Safety education  Today's Interventions: Pt educated on role of OT, OT eval, OT POC, and safety; bed mobility, funcitonal transfers, toileting, LB dressing, UB dressing, grooming, AM-PAC    Activity Tolerance During Today's Session  Tolerated treatment well    Plan  Planned Frequency of Treatment: Plan of Care Initiated: 12/16/23  1-2x per day Weekly Frequency: 3-4 days per week  Planned Treatment Duration: 12/23/23    Planned Interventions:  Education (Patient/Family/Caregiver), Home Exercise Program, Therapeutic Activity, Therapeutic Exercise, Self-Care/Home Training      GOALS:   Patient and Family Goals: to figure out what's going on with  this dizziness    Short Term:   SHORT GOAL #1: Pt will complete toilet t/f with SBA using LRAD; manage clothing and all toileting tasks with min A.   Time Frame : 1 week  SHORT GOAL #2: Pt will complete UB dressing with SBA using LRAD.   Time Frame : 1 week  SHORT GOAL #3: Pt will complete oral care/grooming task with SBA, using LRAD and AE as needed.   Time Frame : 1 week   Long Term Goal #1: Pt will maximize independence with ADLs and functional transfers within 4 weeks.  Time Frame: 4 weeks    Prognosis:  Good  Positive Indicators:  PLOF, motivation, paticipatory  Barriers to Discharge: Impaired Balance, Decreased caregiver support, Endurance deficits, Functional strength deficits, Gait instability, Visual impairments, Inability to safely perform ADLS    Subjective  Medical Updates Since Last Visit/Relevant PMH Affecting Clinical Decision Making:    Prior Functional Status Pt reports that prior to admission she was independent with all BADLs and IADLs, including driving. Pt ambulates with a straight cane and endorses 2 recent falls wherein she was dizzy and lost her balance.    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 type 2 diabetes mellitus (CMS-HCC) 12/02/2022    Dry eye syndrome, bilateral 12/21/2020    Dysphagia 02/02/2023    Endometrial cancer (CMS-HCC) 2007    Esophageal varices in cirrhosis (CMS-HCC) 06/17/2022    Fractures 01/29/2018 Gastroparesis     Generalized pruritus 02/02/2023    GERD (gastroesophageal reflux disease) It's  been going on for years    I take medication  which helps tremendously    Glaucoma 2023    I see a doctor on a regular basis    H/O diabetic gastroparesis 01/09/2021    Heart disease     Hemorrhage of rectum and anus 03/17/2011    Hepatic cirrhosis (CMS-HCC) 06/17/2022    Hepatic encephalopathy (CMS-HCC) 06/17/2022    Hepatic steatosis 03/13/2021    Noted on CT Fall 2021    High cholesterol     Hx of psoriasis 08/06/2022    Hyperlipidemia 12/12/2015    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    Liver disease     Major depression     Metabolic dysfunction-associated steatohepatitis (MASH)     Morbid obesity (CMS-HCC) 11/10/2019    Myopia of both eyes with astigmatism and presbyopia 12/02/2022    Nausea 09/22/2022    Non-alcoholic fatty liver disease     NPDR (nonproliferative diabetic  retinopathy) (CMS-HCC)     Right eye    Other ascites 09/22/2022    Other chronic pain 11/06/2022    Other insomnia 11/06/2022    PFD (pelvic floor dysfunction) 04/07/2013    Portal hypertension (CMS-HCC) 10/08/2022    Psoriasis     Ptosis of eyelid, left     Left upper eyelid    Reflux     Retinopathy of left eye, background, proliferative 01/09/2021    Right upper quadrant abdominal pain 09/22/2022    Secondary esophageal varices without bleeding (CMS-HCC) 06/17/2022    Type 2 diabetes mellitus, with long-term current use of insulin (CMS-HCC) 12/12/2015    Last Assessment & Plan:   The current medical regimen is effective;  continue present plan and medications.  Patient will continue good management of diabetes especially with weight loss. Hopefully with further weight loss will run into the problem of being overmedicated and having to cut back on medications     Some confusion on diabetes medications was have not.been refilled for over a year.  Pa    Social History     Tobacco Use    Smoking status: Never     Passive exposure: Current    Smokeless tobacco: Never   Substance Use Topics    Alcohol use: Not Currently     Comment: No longer      Past Surgical History:   Procedure Laterality Date    CHOLECYSTECTOMY      HYSTERECTOMY      endometrial cancer    HYSTERECTOMY      OOPHORECTOMY      OTHER SURGICAL HISTORY  Hysterectomy was in 2007. Gallbladder was in 1999 and phrenectomy  was in 2024    Hysterectomy,  gallbladder removed and phrenectomy    PR ANAL PRESSURE RECORD Left 03/31/2013    Procedure: ANORECTAL MANOMETRY;  Surgeon: None None;  [facility];  Service: Gastroenterology    PR BREATH HYDROGEN TEST N/A 03/31/2013    Procedure: BREATH HYDROGEN TEST;  Surgeon: None None;  Location: [facility];  Service: Gastroenterology    PR COLSC FLX W/RMVL OF TUMOR POLYP LESION SNARE TQ N/A 09/06/2020    Procedure: COLONOSCOPY FLEX; W/REMOV TUMOR/LES BY SNARE;  Surgeon: Chriss Driver, MD;  Location: [facility];  Service: Gastroenterology    PR UPPER GI ENDOSCOPY,BIOPSY N/A 04/15/2013    Procedure: UGI ENDOSCOPY; WITH BIOPSY, SINGLE OR MULTIPLE;  Surgeon: Vickii Chafe, MD;  [facility]: [facility];  Service: Gastroenterology    PR UPPER GI ENDOSCOPY,BIOPSY N/A 09/06/2020    Procedure: UGI ENDOSCOPY; WITH BIOPSY, SINGLE OR MULTIPLE;  Surgeon: Chriss Driver, MD;  [facility]: [facility];  Service: Gastroenterology    PR UPPER GI ENDOSCOPY,BIOPSY N/A 05/05/2022    Procedure: UGI ENDOSCOPY; WITH BIOPSY, SINGLE OR MULTIPLE;  Surgeon: Chriss Driver, MD;  [facility]: [facility];  Service: Gastroenterology    PR UPPER GI ENDOSCOPY,BIOPSY N/A 01/22/2023    Procedure: UGI ENDOSCOPY; WITH BIOPSY, SINGLE OR MULTIPLE;  Surgeon: Vonda Antigua, MD;  Location: [facility];  Service: Gastroenterology    Family History   Problem Relation Age of Onset    Diabetes Mother     Cancer Mother Complications during dialysis    Depression Mother     Hypertension Mother     Vision loss Mother     No Known Problems Father     No Known Problems Brother  Diabetes Maternal Grandmother     Hypertension Maternal Grandmother     Alcohol abuse Maternal Grandmother         Heavy drinker    Arthritis Maternal Grandmother     Cancer Maternal Grandfather     Diabetes Maternal Grandfather     Alcohol abuse Maternal Grandfather     Heart disease Maternal Grandfather     No Known Problems Paternal Grandmother     Skin cancer Paternal Grandfather     Stroke Paternal Grandfather     Vision loss Maternal Aunt         Heart Attack    No Known Problems Maternal Uncle     No Known Problems Paternal Aunt     No Known Problems Paternal Uncle     No Known Problems Other     Melanoma Neg Hx     Basal cell carcinoma Neg Hx     Squamous cell carcinoma Neg Hx     Substance Abuse Disorder Neg Hx     Drug abuse Neg Hx     Mental illness Neg Hx     Amblyopia Neg Hx     Blindness Neg Hx     Cataracts Neg Hx     Glaucoma Neg Hx     Macular degeneration Neg Hx     Retinal detachment Neg Hx     Strabismus Neg Hx     Thyroid disease Neg Hx     Breast cancer Neg Hx         Patient has no known allergies.     Objective Findings  Precautions / Restrictions  Falls precautions       Weight Bearing  Non-applicable    Required Braces or Orthoses  Non-applicable    Communication Preference  Verbal       Pain  Pt reporting pain to her RUE (proximal humerus), though unable to rate.    Equipment / Environment  Vascular access (PIV, TLC, Port-a-cath, PICC), Telemetry    Living Situation  Living Environment: Trailer/Mobile home  Lives With: Other (cousin)  Home Living: Stairs to enter with rails, Walk-in shower, Standard height toilet, One level home, Hand-held shower hose (Pt reports her WIS is too small for a shower stool)  Rail placement (outside): Bilateral rails in reach  Number of Stairs to Enter (outside): 5  Caregiver Identified?: No  Equipment available at home: Straight cane     Cognition   Orientation Level:  Oriented x 4   Arousal/Alertness:  Appropriate responses to stimuli   Attention Span:  Appears intact   Memory:  Appears intact   Following Commands:  Follows all commands and directions without difficulty   Safety Judgment:  Good awareness of safety precautions   Awareness of Errors and Problem Solving:  Able to problem solve independently    Vision / Hearing   Vision: Wears glasses all the time, Glasses not present     Hearing: No deficit identified         Hand Function:  Right Hand Function: Right hand grip strength, ROM and coordination WNL  Left Hand Function: Left hand grip strength, ROM and coordination WNL  Hand Dominance: Right    Skin Inspection:  Skin Inspection: Intact where visualized    Face/Cervical ROM:  Face ROM: WFL  Cervical ROM: WFL    ROM / Strength:  UE ROM/Strength: Left WFL, Right Impaired/Limited  RUE Impairment: Pain with movement, Limited AROM (R shoulder flexion ~70*)  LE ROM/Strength:  Left WFL, Right WFL    Coordination:  Coordination: Not tested    Sensation:  RUE Sensation: RUE impaired  RUE Sensation Impairment: chronic peripheral neuropathy  LUE Sensation: LUE impaired  LUE Sensation Impairment: chronic peripheral neuropathy  RLE Sensation: RLE impaired  RLE Sensation Impairment: chronic peripheral neuropathy  LLE Sensation: LLE impaired  LLE Sensation Impairment: chronic peripheral neuropathy  Sensory/ Proprioception/ Stereognosis comments: Pt reports recent diagnosis of chronic peripheral neuropathy    Balance:  Static Sitting-Level of Assistance: Independent  Dynamic Sitting-Level of Assistance: Stand by Airline pilot Standing-Level of Assistance: Stand by assistance  Dynamic Standing - Level of Assistance: Contact guard  Standing Balance comments: using RW    Functional Mobility  Transfers: Contact Guard assist (t/f EOB > BSC positioned at bedside with CGA using RW)  Bed Mobility - Needs Assistance: Standby assist  Ambulation: Pt took ~3-4 side steps x2 with CGA using RW while transfering to/from [facility]    ADLs  ADLs: Needs assistance with ADLs  ADLs - Needs Assistance: UB dressing, LB dressing, Toileting, Grooming  Grooming - Needs Assistance:  (grooming simulation seated EOB with SBA)  Toileting - Needs Assistance: Performed seated, Performed standing, Contact Guard assist (t/f to [facility] with CGA using RW; toileting tasks deferred)  UB Dressing - Needs Assistance: Min assist, Performed seated (UB dressing simulation seated EOB with min A 2/2 decresed AROm of RUE)  LB Dressing - Needs Assistance: Supervision, Performed seated (doffing/donning hospital socks seated EOB with supervision)  IADLs: NT    Vitals / Orthostatics  Vitals/Orthostatics: Pt with BP 120/46 supine prior to mobility    Patient at end of session: All needs in reach, Lines intact, Nurse notified, In bed     Occupational Therapy Session Duration  OT Individual [mins]: 40       AM-PAC-Daily Activity  Lower Body Dressing assistance needs: A Little - Minimal/Contact Guard Assist/Supervision  Bathing assistance needs: A lot - Maximum/Moderate Assistance  Toileting assistance needs: A lot - Maximum/Moderate Assistance  Upper Body Dressing assistance needs: A Little - Minimal/Contact Guard Assist/Supervision  Personal Grooming assistance needs: A Little - Minimal/Contact Guard Assist/Supervision  Eating Meals assistance needs: None - Modified Independent/Independent    Daily Activity Score: 17    Score (in points): % of Functional Impairment, Limitation, Restriction  6: 100% impaired, limited, restricted  7-8: At least 80%, but less than 100% impaired, limited restricted  9-13: At least 60%, but less than 80% impaired, limited restricted  14-19: At least 40%, but less than 60% impaired, limited restricted  20-22: At least 20%, but less than 40% impaired, limited restricted  23: At least 1%, but less than 20% impaired, limited restricted  24: 0% impaired, limited restricted      I attest that I have reviewed the above information.  Signed: Janae Bridgeman, OT  Filed 12/16/2023

## 2023-12-16 NOTE — Unmapped (Signed)
Adult Nutrition Assessment Note    Visit Type: MD Consult  Reason for Visit: Assessment (Nutrition)    NUTRITION INTERVENTIONS and RECOMMENDATION     Trial glucerna BID  Weigh weekly  Document PO intakes    NUTRITION ASSESSMENT     Current nutrition therapy is appropriate and progressing toward meeting nutritional needs at this time.   Patient would benefit from start of oral supplement to better meet nutritional needs.  Patient reports consuming about 75% of breakfast thus far and had some nausea afterwards  Elevated POC glucose values noted- would benefit from carb restricted supplement    NUTRITIONALLY RELEVANT DATA     HPI & PMH:    58 y.o. female with a PMH of T2DM, CAD, decompensated NASH cirrhosis w/ esophageal varices and hx of hepatic encephalopathy, calcified cerebral meningioma, hyperlipidemia, hypertension, psoriasis who presents with        Nutrition History:   01/15- Patient endorses reduced PO intake over the past month due to ongoing nausea. Patient reports ongoing nausea for the past 4 months. Patient denies vomiting during this period.    Medications:  Nutritionally pertinent medications reviewed and evaluated for potential food and/or medication interactions.   Jardiance, Mag-ox, Protonix, Aldactone, Mag-ox  Labs:   Nutritionally pertinent labs reviewed and include Na: 128 mmol/L  POC glucose over the past 24 hours -286-429 mg/dl    H0Q- 6.5% on 78/46/96  Nutritional Needs:   Healthy balance of carbohydrate, protein, and fat.     Anthropometric Data:  Height: 162.6 cm (5' 4)   Admission weight: 89.4 kg (197 lb)  Last recorded weight: 90.7 kg (199 lb 15.3 oz)  Date of last recorded weight: 01/14  IBW: 54.49 kg  BMI: Body mass index is 34.32 kg/m??.   Usual Body Weight:  207 lb  Weight Assessment: 13 lb/6% loss noted over the past month- significant  Wt Readings from Last 10 Encounters:   12/15/23 90.7 kg (199 lb 15.3 oz)   12/07/23 89.4 kg (197 lb)   11/21/23 93.1 kg (205 lb 3.2 oz)   11/06/23 94.3 kg (207 lb 12.8 oz)   11/05/23 96.2 kg (212 lb)   11/02/23 96.2 kg (212 lb)   10/26/23 95.8 kg (211 lb 1.6 oz)   10/16/23 97.7 kg (215 lb 6.2 oz)   10/12/23 93.9 kg (207 lb)   10/07/23 96.2 kg (212 lb)       Malnutrition Assessment:  Malnutrition Assessment using AND/ASPEN or GLIM Clinical Characteristics:    Non-severe (Moderate) Protein-Calorie Malnutrition in the context of chronic illness (12/16/23 1355)  Energy Intake: < 75% of estimated energy requirement for > or equal to 1 month  Interpretation of Wt. Loss: > or equal to 5% x 1 month  Fat Loss: Mild  Malnutrition Score: 3            Nutrition Focused Physical Exam:  Nutrition Focused Physical Exam:  Fat Areas Examined  Upper Arm: Mild loss      Muscle Areas Examined  Temple: No loss  Clavicle: No loss  Patellar: No loss  Anterior Thigh: No loss  Posterior Calf: No loss              Nutrition Evaluation  Overall Impressions: No muscle loss, Mild fat loss (12/16/23 1354)     Care plan:  Completed    Current Nutrition:  Oral intake   Nutrition Orders            Nutrition Therapy Regular/House starting at  01/14 2102            Nutritionally Pertinent Allergies, Intolerances, Sensitivities, and/or Cultural/Religious Restrictions:  none identified per chart review at this time     GOALS and EVALUATION     Patient to meet 75% or greater of nutritional needs via combination of meals, snacks, and/or oral supplements within admission.  - New    Motivation, Barriers, and Compliance:  Evaluation of motivation, barriers, and compliance completed. No concerns identified at this time.     Discharge Planning:   Monitor for potential discharge needs with multi-disciplinary team.         Follow-Up Parameters:   1-2 times per week (and more frequent as indicated)    Alesia Morin, RD, LDN

## 2023-12-17 LAB — CBC
HEMATOCRIT: 26.5 % — ABNORMAL LOW (ref 34.0–44.0)
HEMOGLOBIN: 8.6 g/dL — ABNORMAL LOW (ref 11.3–14.9)
MEAN CORPUSCULAR HEMOGLOBIN CONC: 32.3 g/dL (ref 32.0–36.0)
MEAN CORPUSCULAR HEMOGLOBIN: 23.6 pg — ABNORMAL LOW (ref 25.9–32.4)
MEAN CORPUSCULAR VOLUME: 73 fL — ABNORMAL LOW (ref 77.6–95.7)
MEAN PLATELET VOLUME: 8.5 fL (ref 6.8–10.7)
PLATELET COUNT: 135 10*9/L — ABNORMAL LOW (ref 150–450)
RED BLOOD CELL COUNT: 3.64 10*12/L — ABNORMAL LOW (ref 3.95–5.13)
RED CELL DISTRIBUTION WIDTH: 21.2 % — ABNORMAL HIGH (ref 12.2–15.2)
WBC ADJUSTED: 4.9 10*9/L (ref 3.6–11.2)

## 2023-12-17 LAB — COMPREHENSIVE METABOLIC PANEL
ALBUMIN: 2.2 g/dL — ABNORMAL LOW (ref 3.4–5.0)
ALKALINE PHOSPHATASE: 347 U/L — ABNORMAL HIGH (ref 46–116)
ALT (SGPT): 35 U/L (ref 10–49)
ANION GAP: 14 mmol/L (ref 5–14)
AST (SGOT): 81 U/L — ABNORMAL HIGH (ref <=34)
BILIRUBIN TOTAL: 4.7 mg/dL — ABNORMAL HIGH (ref 0.3–1.2)
BLOOD UREA NITROGEN: 30 mg/dL — ABNORMAL HIGH (ref 9–23)
BUN / CREAT RATIO: 24
CALCIUM: 8.9 mg/dL (ref 8.7–10.4)
CHLORIDE: 88 mmol/L — ABNORMAL LOW (ref 98–107)
CO2: 23.2 mmol/L (ref 20.0–31.0)
CREATININE: 1.25 mg/dL — ABNORMAL HIGH (ref 0.55–1.02)
EGFR CKD-EPI (2021) FEMALE: 50 mL/min/{1.73_m2} — ABNORMAL LOW (ref >=60)
GLUCOSE RANDOM: 167 mg/dL (ref 70–179)
POTASSIUM: 4.6 mmol/L (ref 3.5–5.1)
PROTEIN TOTAL: 8 g/dL (ref 5.7–8.2)
SODIUM: 125 mmol/L — ABNORMAL LOW (ref 135–145)

## 2023-12-17 MED ADMIN — pantoprazole (Protonix) EC tablet 40 mg: 40 mg | ORAL | @ 02:00:00

## 2023-12-17 MED ADMIN — magnesium oxide (MAG-OX) tablet 800 mg: 800 mg | ORAL | @ 02:00:00

## 2023-12-17 MED ADMIN — zolpidem (AMBIEN) tablet 5 mg: 5 mg | ORAL | @ 05:00:00

## 2023-12-17 MED ADMIN — lactulose oral solution: 30 g | ORAL | @ 23:00:00

## 2023-12-17 MED ADMIN — lactated ringers bolus 250 mL: 250 mL | INTRAVENOUS | @ 20:00:00 | Stop: 2023-12-17

## 2023-12-17 MED ADMIN — gabapentin (NEURONTIN) capsule 200 mg: 200 mg | ORAL | @ 02:00:00

## 2023-12-17 MED ADMIN — furosemide (LASIX) tablet 40 mg: 40 mg | ORAL | @ 14:00:00

## 2023-12-17 MED ADMIN — magnesium oxide (MAG-OX) tablet 800 mg: 800 mg | ORAL | @ 14:00:00

## 2023-12-17 MED ADMIN — empagliflozin (JARDIANCE) tablet 25 mg: 25 mg | ORAL | @ 14:00:00

## 2023-12-17 MED ADMIN — insulin lispro (HumaLOG) injection 0-20 Units: 0-20 [IU] | SUBCUTANEOUS | @ 23:00:00

## 2023-12-17 MED ADMIN — baclofen (LIORESAL) tablet 5 mg: 5 mg | ORAL | @ 02:00:00

## 2023-12-17 MED ADMIN — lactulose oral solution: 30 g | ORAL | @ 17:00:00 | Stop: 2023-12-17

## 2023-12-17 MED ADMIN — insulin lispro (HumaLOG) injection 0-20 Units: 0-20 [IU] | SUBCUTANEOUS | @ 17:00:00

## 2023-12-17 MED ADMIN — insulin glargine (LANTUS) injection 36 Units: 36 [IU] | SUBCUTANEOUS | @ 02:00:00

## 2023-12-17 MED ADMIN — insulin lispro (HumaLOG) injection 0-20 Units: 0-20 [IU] | SUBCUTANEOUS | @ 02:00:00

## 2023-12-17 MED ADMIN — mirtazapine (REMERON) tablet 7.5 mg: 7.5 mg | ORAL | @ 02:00:00

## 2023-12-17 MED ADMIN — spironolactone (ALDACTONE) tablet 100 mg: 100 mg | ORAL | @ 14:00:00

## 2023-12-17 MED ADMIN — pantoprazole (Protonix) EC tablet 40 mg: 40 mg | ORAL | @ 14:00:00

## 2023-12-17 MED ADMIN — carvedilol (COREG) tablet 6.25 mg: 6.25 mg | ORAL | @ 02:00:00

## 2023-12-17 MED ADMIN — buPROPion (Wellbutrin XL) 24 hr tablet 150 mg: 150 mg | ORAL | @ 14:00:00

## 2023-12-17 MED ADMIN — furosemide (LASIX) tablet 40 mg: 40 mg | ORAL | @ 02:00:00

## 2023-12-17 MED ADMIN — rifAXIMin (XIFAXAN) tablet 550 mg: 550 mg | ORAL | @ 02:00:00 | Stop: 2023-12-30

## 2023-12-17 MED ADMIN — rifAXIMin (XIFAXAN) tablet 550 mg: 550 mg | ORAL | @ 14:00:00 | Stop: 2023-12-30

## 2023-12-17 MED ADMIN — pravastatin (PRAVACHOL) tablet 40 mg: 40 mg | ORAL | @ 02:00:00

## 2023-12-17 NOTE — Unmapped (Signed)
Pt alert and oriented x4. Independent in the room. VSS. PRN ambiem given for sleep with positive effects. Bed locked and in lowest position. Call bell in reach. Bed alarm on. Care plan ongoing.          Problem: Adult Inpatient Plan of Care  Goal: Plan of Care Review  Outcome: Ongoing - Unchanged  Goal: Patient-Specific Goal (Individualized)  Outcome: Ongoing - Unchanged  Goal: Absence of Hospital-Acquired Illness or Injury  Outcome: Ongoing - Unchanged  Intervention: Identify and Manage Fall Risk  Recent Flowsheet Documentation  Taken 12/16/2023 2200 by Chinita Pester, RN  Safety Interventions: fall reduction program maintained  Taken 12/16/2023 2000 by Chinita Pester, RN  Safety Interventions: fall reduction program maintained  Goal: Optimal Comfort and Wellbeing  Outcome: Ongoing - Unchanged  Goal: Readiness for Transition of Care  Outcome: Ongoing - Unchanged  Goal: Rounds/Family Conference  Outcome: Ongoing - Unchanged     Problem: Self-Care Deficit  Goal: Improved Ability to Complete Activities of Daily Living  Outcome: Ongoing - Unchanged     Problem: Fall Injury Risk  Goal: Absence of Fall and Fall-Related Injury  Outcome: Ongoing - Unchanged  Intervention: Promote Injury-Free Environment  Recent Flowsheet Documentation  Taken 12/16/2023 2200 by Chinita Pester, RN  Safety Interventions: fall reduction program maintained  Taken 12/16/2023 2000 by Chinita Pester, RN  Safety Interventions: fall reduction program maintained     Problem: Malnutrition  Goal: Improved Nutritional Intake  Outcome: Ongoing - Unchanged

## 2023-12-17 NOTE — Unmapped (Signed)
Family Medicine Inpatient Service  Progress Note    Team: [facility] (pgr [phone])    Hospital Day: 2    ASSESSMENT / PLAN:   Ashley Briggs is a 58 y.o. female with a PMH of T2DM, CAD, decompensated NASH cirrhosis w/ esophageal varices and hx of hepatic encephalopathy, calcified cerebral meningioma, hyperlipidemia, hypertension, psoriasis who presents with confusion, dizziness, facial numbness in the past week.      # Hepatic encephalopathy  # Polypharmacy  # Hypovolemia  # Confusion - Dizziness  Ongoing for the past month with acute worsening 4 days ago. Has documented report of these symptoms in the past (01/2023). Also documented syncopal episodes in last admission in December 2024. Physical exam did not appreciate any nystagmus. Dizziness could be secondary to low PO intake as patient notes she has inconsistent eating. Last A1c 7.9 (significant improvement from 10.3 4 months ago). Dizziness could be attributed to low or highs in blood pressure as BG on presentation was 324. Low concern for worsening of mengioma as CT scan showed stable appearance. Low concern for thyroid element to dizziness as TSH is stable. May be secondary to over diuresis given low Na and BP. May be a component of hepatic encephalopathy since pt was only having 3 BM's per day with home dose of lactulose. Her symptoms improved with an additional dose of lactulose. Polypharmacy may also be contributing to symptoms. Orthostatics positive.   - Increase lactulose to 30 mg QID - goal 4-5 BM's per day  - Hold diuresis today given hypovolemia on exam  - Give 250 mL LR bolus  - Reduced gabapentin to 200 mg nightly  - Discontinued scopolamine  - PT/OT - plan for home health  - Has scheduled neuro appointment on 1/17 - may need   - Fall precautions  - Nutrition consult     # Esophageal varices  # Cirrhosis  Previously managed on December 2024 concerning for cirrhosis.  MELD 3.0: 23. MELD-Na: 22.  Afebrile with reassuring vitals.  RSV flu COVID-negative.  Lactate 1.7.  U-Tox negative for illicit substances.  Alcohol level was unremarkable.  Low concern for cardiac etiology as troponins negative.  - Daily PT/INR  - Daily CBC  - Daily CMP  - Rifaximin 550 mg twice daily  - Lactulose 30 g 4 times daily     # Type 2 diabetes  Last A1c 7.9. Improved from 10 four months ago.   - ACHS  - SSI  - Home regime of 36 units lantus nigtly.  - Jardiance 25 mg daily     # Mild hyperkalemia  K 4.9 on presentation  - CTM     # Elevated creatinine  Baseline of creatinine is 0.9-1.  Creatinine on January 6 was 1.29.  Creatinine ED presentation was 1.10.  Does not meet criteria for AKI   - Continue to monitor     # Hyperlipidemia  -Pravastatin 40 mg nightly     # Hypertension  - Resume Coreg 6.25 mg BID     # Insomnia  - Mirtazapine 7.5 mg nightly    # HME  Bedside commode: The patient is confined to one room or The patient is confined to one level of the home with no toilet on that level or The patient is confined to a home with no toilet facilities in the home     Rolling Walker: The beneficiary has a mobility limitation that significantly impairs his/her ability to participate in one or more  mobility related activities of daily living in the home. A mobility limitation prevents the beneficiary from accomplishing the MRADL entirely. The beneficiary is able to safely use the walker.       # Checklist:  - IVF None  - Daily labs needed: CBC and CMP  - Diet Regular  - Bowel Regimen:  On home lactulose  - DVT: None, anticipate short stay  - Code Status:   Orders Placed This Encounter   Procedures    Full Code     Standing Status:   Standing     Number of Occurrences:   1     - Dispo: Floor    [ ]  Anticipated Discharge Location: Home with home health  [ ]  PT/OT/DME: PT/OT ordered  [ ]  CM/SW needs: Likely pending PT/OT recs  [ ]  Follow up appt: Appointment needed    SUBJECTIVE:  Interval events: NAOEN.      REVIEW OF SYSTEMS:  Pertinent positives and negatives per HPI. A complete review of systems otherwise negative.    PHYSICAL EXAM:      Intake/Output Summary (Last 24 hours) at 12/17/2023 1405  Last data filed at 12/17/2023 0533  Gross per 24 hour   Intake --   Output 4 ml   Net -4 ml       Recent Vitals:  Vitals:    12/17/23 0915   BP: 108/50   Pulse: 80   Resp:    Temp:    SpO2:        GEN: well appearing, lying in bed, NAD   HEENT: NCAT, No scleral icterus. Conjunctiva non-erythematous. MMM.  CV: Regular rate and rhythm. No murmurs/rubs/gallops.  Pulm: Normal work of breathing on room air. CTAB. No wheezing, crackles, or rhonchi.  Abd: Flat.  Nontender. No guarding, rebound.  Normoactive bowel sounds.    Neuro: A&O x 3. No focal deficits. No asterixis.   Ext: No peripheral edema.  Palpable distal pulses.  Skin: Jaundice.      LABS/ STUDIES:    All imaging, laboratory studies, and other pertinent tests including electrocardiography within the last 24 hours were reviewed and are summarized within the assessment and plan.     NUTRITION:  Malnutrition Evaluation as performed by RD, LDN: Non-severe (Moderate) Protein-Calorie Malnutrition in the context of chronic illness (12/16/23 1355)        Energy Intake: < 75% of estimated energy requirement for > or equal to 1 month  Interpretation of Wt. Loss: > or equal to 5% x 1 month  Fat Loss: Mild               Nestor Lewandowsky, MD, PGY1

## 2023-12-17 NOTE — Progress Notes (Signed)
(12/21/2023) NO-SHOW to 2nd encounter to discuss results of ordered tests.  The patient called on the day of the appointment indicating that she had just been released from the hospital and could not come into today's appointment.  Review of the electronic medical record confirmed the patient to have been admitted on 12/15/2023 due to hepatic encephalopathy.  (11/23/2023) NO-SHOW to 2nd encounter to discuss results of ordered tests.   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 The most significant is constant abdominal pain, primarily in the epigastric region and upper quadrants, occasionally radiating to the lower quadrants. The pain is not alleviated by paracentesis, which has been performed twice in the past year. The patient also reports episodes of jaundice, most recently noted a couple of weeks ago. She denies any hospital admissions for confusion or encephalopathy, but has been diagnosed with these conditions.   The patient's second most significant complaint is constant headaches, which began in December of the previous year. The pain is bilateral, affecting the frontal, temporal, and occipital regions. No specific trigger for the onset of these headaches was identified. The patient also reports stiffness and pain in the back of the neck, more pronounced on the right side, and pain and weakness in the right arm, extending from the shoulder to the elbow. She has a history of a fracture in the humerus earlier in the year, with a fragment reportedly migrating into the rotator cuff, causing additional shoulder pain. An MRI confirmed shoulder tears and rotator cuff damage, and a consultation with an orthopedic surgeon is scheduled.   The patient also reports constant pain across the lower back, with no radiation into the legs. She experiences generalized weakness in both  legs. Lastly, the patient has bilateral knee pain, which started the previous year. Initial treatment with steroid injections provided temporary relief, but the pain has since persisted. The patient has a scheduled consultation with an orthopedic surgeon for further evaluation."   Review of diagnostic tests ordered on 11/02/2023: Diagnostic lab work shows the patient to have a vitamin D deficiency of 4.3 ng/mL (normal 30-100).  Comprehensive metabolic panel demonstrated the blood sugar levels to be at 303 with a potassium of 5.2 mmol/L (normal 3.5-5.1); low sodium of 127 mmol/L (1 35-1 45); decreased chloride at 96 mmol/L (normal 98-111) and decreased CO2 levels of 21 mmol/L (22-32).  The patient also presented with elevated total proteins, decrease albumin and increased AST with elevated alkaline phosphatase and total bilirubin.  In addition the lab work demonstrated elevated C-reactive protein and sed rate with elevated vitamin B12 levels.  The urine drug screen test came back positive for a failed alcohol, suspected to be secondary to the fermentation of blood glucose.  Diagnostic x-rays of the cervical spine with flexion and extension views demonstrate minor degenerative disc disease changes at C6-7 with minor multilevel facet hypertrophy.  Diagnostic x-rays of the right knee show tricompartmental osteoarthritis,, moderate in the medial tibiofemoral compartment.  Suspected intra-articular bodies posteriorly in the joint space.  Trace knee joint effusion.  Diagnostic x-rays of the left knee show moderate tricompartmental osteoarthritis, worse in the medial tibiofemoral compartment.  Suspected intra-articular ossified bodies posteriorly in the joint space.  Trace knee joint effusion.  Diagnostic x-rays of the lumbar spine with bending views show a grade 1 anterolisthesis of L4 over L5 without any evidence of instability.  There is moderate L4-5 and L5-S1 facet  hypertrophy.  Diagnostic x-rays of the right  shoulder show a healed proximal humeral fracture with minimal residual posttraumatic deformity.  Minor acromioclavicular degenerative changes.  Controlled Substance Pharmacotherapy Assessment REMS (Risk Evaluation and Mitigation Strategy)  Opioid Analgesic: No chronic opioid analgesics therapy prescribed by our practice. None MME/day: 0 mg/day  Pill Count: None expected due to no prior prescriptions written by our practice. No notes on file Pharmacokinetics: Liberation and absorption (onset of action): WNL Distribution (time to peak effect): WNL Metabolism and excretion (duration of action): WNL         Pharmacodynamics: Desired effects: Analgesia: Ms. Holtzer reports >50% benefit. Functional ability: Patient reports that medication allows her to accomplish basic ADLs Clinically meaningful improvement in function (CMIF): Sustained CMIF goals met Perceived effectiveness: Described as relatively effective, allowing for increase in activities of daily living (ADL) Undesirable effects: Side-effects or Adverse reactions: None reported Monitoring: [place] PMP: PDMP reviewed during this encounter. Online review of the past 12-month period previously conducted. Not applicable at this point since we have not taken over the patient's medication management yet. List of other Serum/Urine Drug Screening Test(s):  No results found for: "AMPHSCRSER", "BARBSCRSER", "BENZOSCRSER", "COCAINSCRSER", "COCAINSCRNUR", "PCPSCRSER", "THCSCRSER", "THCU", "CANNABQUANT", "OPIATESCRSER", "OXYSCRSER", "PROPOXSCRSER", "ETH", "CBDTHCR", "D8THCCBX", "D9THCCBX" List of all UDS test(s) done:  Lab Results  Component Value Date   SUMMARY FINAL 11/02/2023   Last UDS on record: Summary  Date Value Ref Range Status  11/02/2023 FINAL  Final    Comment:    ==================================================================== Compliance Drug Analysis, Ur ==================================================================== Test                              Result       Flag       Units  Drug Present and Declared for Prescription Verification   Gabapentin                     PRESENT      EXPECTED   Baclofen                       PRESENT      EXPECTED   Zolpidem                       PRESENT      EXPECTED   Zolpidem Acid                  PRESENT      EXPECTED    Zolpidem acid is an expected metabolite of zolpidem.    Mirtazapine                    PRESENT      EXPECTED  Drug Present not Declared for Prescription Verification   Alcohol, Ethyl                 0.033        UNEXPECTED g/dL    Sources of ethyl alcohol include alcoholic beverages or as a    fermentation product of glucose; glucose is present in this specimen.    Interpret result with caution, as the presence of ethyl alcohol is    likely due, at least in part, to fermentation of glucose.    Diphenhydramine                PRESENT      UNEXPECTED   Doxylamine  PRESENT      UNEXPECTED   Hydroxyzine                    PRESENT      UNEXPECTED  Drug Absent but Declared for Prescription Verification   Bupropion                      Not Detected UNEXPECTED   Citalopram                     Not Detected UNEXPECTED   Salicylate                     Not Detected UNEXPECTED    Aspirin, as indicated in the declared medication list, is not always    detected even when used as directed.    Promethazine                   Not Detected UNEXPECTED ==================================================================== Test                      Result    Flag   Units      Ref Range   Creatinine              21               mg/dL      >=40 ==================================================================== Declared Medications:  The flagging and interpretation on this report are based on the  following declared medications.  Unexpected results may arise from  inaccuracies in the declared medications.   **Note: The testing scope of this panel includes  these medications:   Baclofen  Bupropion (Wellbutrin)  Citalopram (Celexa)  Gabapentin  Mirtazapine (Remeron)  Promethazine (Phenergan)   **Note: The testing scope of this panel does not include small to  moderate amounts of these reported medications:   Aspirin  Zolpidem (Ambien)   **Note: The testing scope of this panel does not include the  following reported medications:   Canagliflozin (Invokana)  Carvedilol (Coreg)  Chlorhexidine (Peridex)  Clotrimazole  Dulaglutide (Trulicity)  Furosemide (Lasix)  Insulin (Lantus)  Iron  Lactulose  Magnesium (Mag-Ox)  Metformin  Ondansetron (Zofran)  Pantoprazole (Protonix)  Rifaximin (Xifaxan)  Secukinumab (Cosentyx)  Simvastatin (Zocor)  Spironolactone (Aldactone) ==================================================================== For clinical consultation, please call [phone]. ====================================================================    Risk Assessment Profile:  Opioid risk tool (ORT):     11/02/2023    2:10 PM  Opioid Risk   Alcohol 1  Illegal Drugs 2  Rx Drugs 4  Alcohol 0  Illegal Drugs 0  Rx Drugs 0  Age between 16-45 years  0  History of Preadolescent Sexual Abuse 0  Psychological Disease 2  Depression 1  Opioid Risk Tool Scoring 10  Opioid Risk Interpretation High Risk    ORT Scoring interpretation table:  Score <3 = Low Risk for SUD  Score between 4-7 = Moderate Risk for SUD  Score >8 = High Risk for Opioid Abuse   Laboratory Chemistry Profile   Renal Lab Results  Component Value Date   BUN 19 11/02/2023   CREATININE 0.88 11/02/2023   LABCREA 57.3 02/11/2022   BCR 10 11/17/2018   GFRAA 110 11/17/2018   GFRNONAA >60 11/02/2023   SPECGRAV 1.006 03/12/2016   PHUR 7.0 03/12/2016   PROTEINUR NEGATIVE 08/19/2018     Electrolytes Lab Results  Component Value Date   NA 127 (L) 11/02/2023  K 5.2 (H) 11/02/2023   CL 96 (L) 11/02/2023   CALCIUM 9.4 11/02/2023   MG 2.0  11/02/2023     Hepatic Lab Results  Component Value Date   AST 47 (H) 11/02/2023   ALT 26 11/02/2023   ALBUMIN 2.7 (L) 11/02/2023   ALKPHOS 257 (H) 11/02/2023   AMYLASE 60 11/17/2018   LIPASE 137 (H) 11/17/2018   AMMONIA 19 03/31/2023     ID No results found for: "LYMEIGGIGMAB", "HIV", "SARSCOV2NAA", "STAPHAUREUS", "MRSAPCR", "HCVAB", "PREGTESTUR", "RMSFIGG", "QFVRPH1IGG", "QFVRPH2IGG"   Bone Lab Results  Component Value Date   25OHVITD1 4.3 (L) 11/02/2023   25OHVITD2 <1.0 11/02/2023   25OHVITD3 4.3 11/02/2023     Endocrine Lab Results  Component Value Date   GLUCOSE 303 (H) 11/02/2023   GLUCOSEU NEGATIVE 08/19/2018   HGBA1C 7.8 11/04/2022   TSH 1.490 06/01/2018     Neuropathy Lab Results  Component Value Date   VITAMINB12 1,267 (H) 11/02/2023   FOLATE 5.7 03/12/2016   HGBA1C 7.8 11/04/2022     CNS No results found for: "COLORCSF", "APPEARCSF", "RBCCOUNTCSF", "WBCCSF", "POLYSCSF", "LYMPHSCSF", "EOSCSF", "PROTEINCSF", "GLUCCSF", "JCVIRUS", "CSFOLI", "IGGCSF", "LABACHR", "ACETBL"   Inflammation (CRP: Acute  ESR: Chronic) Lab Results  Component Value Date   CRP 1.2 (H) 11/02/2023   ESRSEDRATE 65 (H) 11/02/2023   LATICACIDVEN 1.9 08/20/2018     Rheumatology No results found for: "RF", "ANA", "LABURIC", "URICUR", "LYMEIGGIGMAB", "LYMEABIGMQN", "HLAB27"   Coagulation Lab Results  Component Value Date   PLT 179 03/30/2023   LABHEMA Note: 03/12/2016     Cardiovascular Lab Results  Component Value Date   TROPONINI <0.03 08/19/2018   HGB 9.6 (L) 03/30/2023   HCT 32.1 (L) 03/30/2023     Screening No results found for: "SARSCOV2NAA", "COVIDSOURCE", "STAPHAUREUS", "MRSAPCR", "HCVAB", "HIV", "PREGTESTUR"   Cancer No results found for: "CEA", "CA125", "LABCA2"   Allergens No results found for: "ALMOND", "APPLE", "ASPARAGUS", "AVOCADO", "BANANA", "BARLEY", "BASIL", "BAYLEAF", "GREENBEAN", "LIMABEAN", "WHITEBEAN", "BEEFIGE", "REDBEET", "BLUEBERRY", "BROCCOLI",  "CABBAGE", "MELON", "CARROT", "CASEIN", "CASHEWNUT", "CAULIFLOWER", "CELERY"     Note: Lab results reviewed.  Recent Diagnostic Imaging Review  Cervical Imaging: Cervical DG Bending/F/E views: Results for orders placed during the hospital encounter of 11/02/23 DG Cervical Spine With Flex & Extend  Narrative CLINICAL DATA:  Chronic intractable headache. Chronic neck pain. Chronic right shoulder pain. Chronic pain syndrome.  EXAM: CERVICAL SPINE COMPLETE WITH FLEXION AND EXTENSION VIEWS  COMPARISON:  None Available.  FINDINGS: Slight straightening of normal lordosis. No listhesis. No abnormal motion on flexion or extension. Minor disc space narrowing and spurring at C6-C7. Minor multilevel facet hypertrophy. No high-grade bony neural foraminal stenosis. No fracture or compression deformity. No evidence of focal bone lesion by radiograph. No prevertebral soft tissue thickening.  IMPRESSION: Minor degenerative disc disease at C6-C7. Minor multilevel facet hypertrophy.   Electronically Signed By: Narda Rutherford M.D. On: 11/11/2023 10:42  Shoulder Imaging: Shoulder-R DG: Results for orders placed during the hospital encounter of 11/02/23 DG Shoulder Right  Narrative CLINICAL DATA:  Right shoulder pain. Chronic right shoulder pain. Chronic pain syndrome.  EXAM: RIGHT SHOULDER - 2+ VIEW  COMPARISON:  Radiograph 03/18/2023.  FINDINGS: Previous proximal humeral fracture has healed with minimal residual posttraumatic deformity. Fracture fragments are in unchanged alignment from prior exam. Fracture involves the rotator cuff insertion in the lateral humeral head. Glenohumeral alignment is maintained. Minor acromioclavicular degenerative change. No erosions or acute fracture. No evidence of focal bone lesion by radiograph.  IMPRESSION: 1. Healed proximal humeral fracture with  minimal residual posttraumatic deformity. 2. Minor acromioclavicular degenerative  change.   Electronically Signed By: Narda Rutherford M.D. On: 11/11/2023 10:43  Lumbosacral Imaging: Lumbar DG Bending views: Results for orders placed during the hospital encounter of 11/02/23 DG Lumbar Spine Complete W/Bend  Narrative CLINICAL DATA:  Low back pain. Chronic bilateral low back pain without sciatica. Chronic pain in both knees. Chronic pain syndrome.  EXAM: LUMBAR SPINE - COMPLETE WITH BENDING VIEWS  COMPARISON:  None Available.  FINDINGS: Five non-rib-bearing lumbar vertebra. Grade 1 anterolisthesis of L4 on L5 of 4 mm. No change in alignment on flexion and extension. No evidence of instability. Normal vertebral body heights. Minor L5-S1 disc space narrowing. Moderate L4-L5 and L5-S1 facet hypertrophy. No evidence of fracture or compression deformity. No visible pars defects, although technically limited assessment due to habitus. No focal bone lesions by radiograph. Sacroiliac joints are congruent.  IMPRESSION: 1. Grade 1 anterolisthesis of L4 on L5 without evidence of instability. 2. Moderate L4-L5 and L5-S1 facet hypertrophy.   Electronically Signed By: Narda Rutherford M.D. On: 11/11/2023 10:39  Knee Imaging: Knee-R DG 4 views: Results for orders placed during the hospital encounter of 11/02/23 DG Knee Complete 4 Views Right  Narrative CLINICAL DATA:  Right knee pain. Chronic pain in both knees. Primary osteoarthritis of both knees. Chronic pain syndrome.  EXAM: RIGHT KNEE - COMPLETE 4+ VIEW  COMPARISON:  Radiograph 11/12/2021  FINDINGS: Mild to moderate medial tibiofemoral joint space narrowing. Moderate tricompartmental peripheral spurring. Suspected intra-articular bodies posteriorly in the joint space. Trace knee joint effusion. No fracture or erosion. No evidence of focal bone lesion by radiograph. Peripheral vascular calcifications  IMPRESSION: 1. Tricompartmental osteoarthritis, moderate in the medial tibiofemoral  compartment. 2. Suspected intra-articular bodies posteriorly in the joint space. 3. Trace knee joint effusion.   Electronically Signed By: Narda Rutherford M.D. On: 11/11/2023 10:45  Knee-L DG 4 views: Results for orders placed during the hospital encounter of 11/02/23 DG Knee Complete 4 Views Left  Narrative CLINICAL DATA:  Knee pain. Chronic pain of both knees. Primary osteoarthritis of both knees. Chronic pain syndrome.  EXAM: LEFT KNEE - COMPLETE 4+ VIEW  COMPARISON:  10/03/2014  FINDINGS: Moderate medial tibiofemoral joint space narrowing. Moderate tricompartmental peripheral spurring. Suspected intra-articular ossified bodies posteriorly in the joint space. Trace knee joint effusion. No fracture. No erosive change or focal bone abnormality by radiograph. Peripheral vascular calcifications.  IMPRESSION: 1. Moderate tricompartmental osteoarthritis, worst in the medial tibiofemoral compartment. 2. Suspected intra-articular ossified bodies posteriorly in the joint space. 3. Trace knee joint effusion.   Electronically Signed By: Narda Rutherford M.D. On: 11/11/2023 10:46  Complexity Note: Imaging results reviewed.                         Meds   Current Outpatient Medications:    Accu-Chek Softclix Lancets lancets, SMARTSIG:Topical, Disp: , Rfl:    aspirin 81 MG tablet, Take 81 mg by mouth daily., Disp: , Rfl:    Baclofen 5 MG TABS, Take 2 tablets by mouth at bedtime., Disp: , Rfl:    BD PEN NEEDLE NANO 2ND GEN 32G X 4 MM MISC, USE 1 NIGHTLY, Disp: , Rfl:    buPROPion (WELLBUTRIN XL) 150 MG 24 hr tablet, Take 1 tablet (150 mg total) by mouth daily., Disp: 30 tablet, Rfl: 3   canagliflozin (INVOKANA) 300 MG TABS tablet, Take by mouth., Disp: , Rfl:    carvedilol (COREG) 6.25 MG tablet, Take  6.25 mg by mouth 2 (two) times daily., Disp: , Rfl:    chlorhexidine (PERIDEX) 0.12 % solution, SMARTSIG:0.5 Capful(s) By Mouth Morning-Evening, Disp: , Rfl:    citalopram  (CELEXA) 20 MG tablet, Take 20 mg by mouth daily., Disp: , Rfl:    clotrimazole (MYCELEX) 10 MG troche, Take 10 mg by mouth 5 (five) times daily., Disp: , Rfl:    Continuous Glucose Receiver (DEXCOM G7 RECEIVER) DEVI, USE AS INSTRUCTED WITH G7 SENSORS, Disp: , Rfl:    Continuous Glucose Sensor (DEXCOM G7 SENSOR) MISC, USE TO MONITOR BLOOD GLUCOSE LEVELS CONTINUOUSLY. CHANGE SENSOR EVERY 10 DAYS., Disp: , Rfl:    COSENTYX SENSOREADY, 300 MG, 150 MG/ML SOAJ, Inject into the skin., Disp: , Rfl:    Dulaglutide (TRULICITY) 1.5 MG/0.5ML SOPN, Inject 1.5 mg into the skin once a week. Thursdays, Disp: , Rfl:    Ferrous Sulfate (SLOW FE) 142 (45 Fe) MG TBCR, Take by mouth., Disp: , Rfl:    furosemide (LASIX) 40 MG tablet, Take 40 mg by mouth 2 (two) times daily., Disp: , Rfl:    gabapentin (NEURONTIN) 300 MG capsule, Take by mouth once., Disp: , Rfl:    insulin glargine-yfgn (SEMGLEE, YFGN,) 100 UNIT/ML Pen, INJECT 50 UNITS UNDER THE SKIN NIGHTLY AS DIRECTED BY YOUR DOCTOR AROUND SAME TIME EVERY DAY. TOTAL DAILY DOSE NOT TO EXCEED 50 UNITS, Disp: , Rfl:    insulin lispro (HUMALOG) 100 UNIT/ML KwikPen, Inject into the skin., Disp: , Rfl:    Insulin Pen Needle 32G X 6 MM MISC, 1 Syringe by Does not apply route. Use with Victoza., Disp: , Rfl:    lactulose (CHRONULAC) 10 GM/15ML solution, SMARTSIG:Milliliter(s) By Mouth, Disp: , Rfl:    LANTUS SOLOSTAR 100 UNIT/ML Solostar Pen, Inject 66 Units into the skin daily., Disp: , Rfl:    MAGNESIUM-OXIDE 400 (240 Mg) MG tablet, Take 2 tablets by mouth 2 (two) times daily., Disp: , Rfl:    metFORMIN (GLUCOPHAGE-XR) 500 MG 24 hr tablet, Take 1,000 mg by mouth 2 (two) times daily., Disp: , Rfl:    mirtazapine (REMERON) 15 MG tablet, Take 1 tablet (15 mg total) by mouth at bedtime., Disp: 30 tablet, Rfl: 0   ondansetron (ZOFRAN-ODT) 4 MG disintegrating tablet, Dissolve 1 tablet (4 mg total) in the mouth every twelve (12) hours as needed for nausea., Disp: , Rfl:     pantoprazole (PROTONIX) 40 MG tablet, TAKE ONE TABLET BY MOUTH EVERY DAY, Disp: 90 tablet, Rfl: 0   promethazine (PHENERGAN) 12.5 MG tablet, Take by mouth., Disp: , Rfl:    Secukinumab (COSENTYX SENSOREADY PEN) 150 MG/ML SOAJ, Inject the contents of 2 pens (300 mg total) under the skin once a week at weeks 0, 1, 2, 3, and 4. THEN inject the contents of 2 pens (300 mg total) every 4 weeks thereafter., Disp: , Rfl:    Sharps Container (BD SHARPS COLLECTOR) MISC, Use as directed to dispose of Cosentyx pens., Disp: , Rfl:    simvastatin (ZOCOR) 20 MG tablet, TAKE ONE TABLET BY MOUTH EVERY EVENING, Disp: 90 tablet, Rfl: 0   spironolactone (ALDACTONE) 100 MG tablet, Take 100 mg by mouth 2 (two) times daily., Disp: , Rfl:    XIFAXAN 550 MG TABS tablet, Take 550 mg by mouth 2 (two) times daily., Disp: , Rfl:    zolpidem (AMBIEN) 5 MG tablet, Take 1 tablet (5 mg total) by mouth at bedtime as needed for sleep., Disp: 30 tablet, Rfl: 1  ROS  Constitutional: Denies  any fever or chills Gastrointestinal: No reported hemesis, hematochezia, vomiting, or acute GI distress Musculoskeletal: Denies any acute onset joint swelling, redness, loss of ROM, or weakness Neurological: No reported episodes of acute onset apraxia, aphasia, dysarthria, agnosia, amnesia, paralysis, loss of coordination, or loss of consciousness  Allergies  Ms. Elizarraraz has no known allergies.     Interventional Therapies  Risk Factors  Considerations  Medical Comorbidities:  Liver cirrhosis w/ ascites  Portal HTN w/ esophageal varices  T2IDDM w/ compl.  GERD  CAD  HTN     Planned  Pending:      Under consideration:  (HIGH RISK) Diagnostic bilateral celiac plexus block vs. splanchnic nerve block #1 (w/o steroids)    Completed:   None at this time   Therapeutic  Palliative (PRN) options:   None established   Completed by other providers:   None reported       Primary Care Physician: [facility]  Note by:  Oswaldo Done, MD (TTS technology used. I apologize for any typographical errors that were not detected and corrected.) Date: 12/21/2023; Time: 12:02 PM

## 2023-12-18 ENCOUNTER — Other Ambulatory Visit: Payer: Self-pay | Admitting: Psychiatry

## 2023-12-18 LAB — COMPREHENSIVE METABOLIC PANEL
ALBUMIN: 2.3 g/dL — ABNORMAL LOW (ref 3.4–5.0)
ALKALINE PHOSPHATASE: 348 U/L — ABNORMAL HIGH (ref 46–116)
ALT (SGPT): 36 U/L (ref 10–49)
ANION GAP: 16 mmol/L — ABNORMAL HIGH (ref 5–14)
AST (SGOT): 85 U/L — ABNORMAL HIGH (ref <=34)
BILIRUBIN TOTAL: 4.8 mg/dL — ABNORMAL HIGH (ref 0.3–1.2)
BLOOD UREA NITROGEN: 27 mg/dL — ABNORMAL HIGH (ref 9–23)
BUN / CREAT RATIO: 25
CALCIUM: 9.3 mg/dL (ref 8.7–10.4)
CHLORIDE: 93 mmol/L — ABNORMAL LOW (ref 98–107)
CO2: 21.2 mmol/L (ref 20.0–31.0)
CREATININE: 1.07 mg/dL — ABNORMAL HIGH (ref 0.55–1.02)
EGFR CKD-EPI (2021) FEMALE: 60 mL/min/{1.73_m2} (ref >=60)
GLUCOSE RANDOM: 210 mg/dL — ABNORMAL HIGH (ref 70–179)
POTASSIUM: 4.3 mmol/L (ref 3.4–4.8)
PROTEIN TOTAL: 8.3 g/dL — ABNORMAL HIGH (ref 5.7–8.2)
SODIUM: 130 mmol/L — ABNORMAL LOW (ref 135–145)

## 2023-12-18 LAB — CBC
HEMATOCRIT: 27.6 % — ABNORMAL LOW (ref 34.0–44.0)
HEMOGLOBIN: 8.7 g/dL — ABNORMAL LOW (ref 11.3–14.9)
MEAN CORPUSCULAR HEMOGLOBIN CONC: 31.6 g/dL — ABNORMAL LOW (ref 32.0–36.0)
MEAN CORPUSCULAR HEMOGLOBIN: 23.5 pg — ABNORMAL LOW (ref 25.9–32.4)
MEAN CORPUSCULAR VOLUME: 74.4 fL — ABNORMAL LOW (ref 77.6–95.7)
MEAN PLATELET VOLUME: 8.3 fL (ref 6.8–10.7)
PLATELET COUNT: 138 10*9/L — ABNORMAL LOW (ref 150–450)
RED BLOOD CELL COUNT: 3.72 10*12/L — ABNORMAL LOW (ref 3.95–5.13)
RED CELL DISTRIBUTION WIDTH: 21.1 % — ABNORMAL HIGH (ref 12.2–15.2)
WBC ADJUSTED: 4.6 10*9/L (ref 3.6–11.2)

## 2023-12-18 MED ORDER — LACTULOSE 10 GRAM/15 ML ORAL SOLUTION
Freq: Four times a day (QID) | ORAL | 0 refills | 30.00 days | Status: CP
Start: 2023-12-18 — End: 2024-01-17

## 2023-12-18 MED ORDER — MIRTAZAPINE 7.5 MG TABLET
ORAL_TABLET | Freq: Every evening | ORAL | 3 refills | 90.00 days | Status: CN
Start: 2023-12-18 — End: 2024-01-17

## 2023-12-18 MED ORDER — BACLOFEN 5 MG TABLET
ORAL_TABLET | Freq: Every evening | ORAL | 0 refills | 30.00 days | Status: CP
Start: 2023-12-18 — End: 2023-12-18

## 2023-12-18 MED ORDER — GABAPENTIN 300 MG CAPSULE
ORAL_CAPSULE | Freq: Every evening | ORAL | 0 refills | 30.00 days | Status: CP
Start: 2023-12-18 — End: 2024-01-17

## 2023-12-18 MED ORDER — SPIRONOLACTONE 100 MG TABLET
ORAL_TABLET | Freq: Every day | ORAL | 0 refills | 30.00 days | Status: CP
Start: 2023-12-18 — End: 2024-01-17

## 2023-12-18 MED ORDER — FUROSEMIDE 40 MG TABLET
ORAL_TABLET | Freq: Every day | ORAL | 0 refills | 30.00 days | Status: CP
Start: 2023-12-18 — End: 2024-01-17

## 2023-12-18 MED ADMIN — lactulose oral solution: 30 g | ORAL | @ 02:00:00

## 2023-12-18 MED ADMIN — insulin lispro (HumaLOG) injection 0-20 Units: 0-20 [IU] | SUBCUTANEOUS | @ 13:00:00 | Stop: 2023-12-18

## 2023-12-18 MED ADMIN — pravastatin (PRAVACHOL) tablet 40 mg: 40 mg | ORAL | @ 02:00:00

## 2023-12-18 MED ADMIN — carvedilol (COREG) tablet 6.25 mg: 6.25 mg | ORAL | @ 02:00:00

## 2023-12-18 MED ADMIN — empagliflozin (JARDIANCE) tablet 25 mg: 25 mg | ORAL | @ 13:00:00 | Stop: 2023-12-18

## 2023-12-18 MED ADMIN — magnesium oxide (MAG-OX) tablet 800 mg: 800 mg | ORAL | @ 13:00:00 | Stop: 2023-12-18

## 2023-12-18 MED ADMIN — rifAXIMin (XIFAXAN) tablet 550 mg: 550 mg | ORAL | @ 02:00:00 | Stop: 2023-12-30

## 2023-12-18 MED ADMIN — insulin lispro (HumaLOG) injection 0-20 Units: 0-20 [IU] | SUBCUTANEOUS | @ 02:00:00

## 2023-12-18 MED ADMIN — lactulose oral solution: 30 g | ORAL | @ 17:00:00 | Stop: 2023-12-18

## 2023-12-18 MED ADMIN — insulin glargine (LANTUS) injection 36 Units: 36 [IU] | SUBCUTANEOUS | @ 02:00:00

## 2023-12-18 MED ADMIN — rifAXIMin (XIFAXAN) tablet 550 mg: 550 mg | ORAL | @ 13:00:00 | Stop: 2023-12-18

## 2023-12-18 MED ADMIN — mirtazapine (REMERON) tablet 7.5 mg: 7.5 mg | ORAL | @ 02:00:00

## 2023-12-18 MED ADMIN — baclofen (LIORESAL) tablet 5 mg: 5 mg | ORAL | @ 02:00:00

## 2023-12-18 MED ADMIN — zolpidem (AMBIEN) tablet 5 mg: 5 mg | ORAL | @ 02:00:00

## 2023-12-18 MED ADMIN — lactulose oral solution: 30 g | ORAL | @ 12:00:00 | Stop: 2023-12-18

## 2023-12-18 MED ADMIN — gabapentin (NEURONTIN) capsule 200 mg: 200 mg | ORAL | @ 02:00:00

## 2023-12-18 MED ADMIN — magnesium oxide (MAG-OX) tablet 800 mg: 800 mg | ORAL | @ 02:00:00

## 2023-12-18 MED ADMIN — carvedilol (COREG) tablet 6.25 mg: 6.25 mg | ORAL | @ 13:00:00 | Stop: 2023-12-18

## 2023-12-18 MED ADMIN — pantoprazole (Protonix) EC tablet 40 mg: 40 mg | ORAL | @ 13:00:00 | Stop: 2023-12-18

## 2023-12-18 MED ADMIN — insulin lispro (HumaLOG) injection 0-20 Units: 0-20 [IU] | SUBCUTANEOUS | @ 17:00:00 | Stop: 2023-12-18

## 2023-12-18 MED ADMIN — buPROPion (Wellbutrin XL) 24 hr tablet 150 mg: 150 mg | ORAL | @ 13:00:00 | Stop: 2023-12-18

## 2023-12-18 MED ADMIN — pantoprazole (Protonix) EC tablet 40 mg: 40 mg | ORAL | @ 02:00:00

## 2023-12-18 NOTE — Unmapped (Signed)
Copied from CRM #[id number]. Topic: Access To Clinicians - Req Clinic Call Back  >> Dec 18, 2023  2:51 PM Cecil Cranker wrote:  Aniceto Boss with [facility]  called requesting: a hospital admission follow-up appointment. Patient was discharged on 12-18-2023  The first available appt is > 14 days out.   Patient is currently on the schedule for 12-24-23 for a return visit.  Hospital doc would like patient to be seen soon due to dizzy symptoms.    Please contact patient If unable to convert 12-24-23 appt to hospital follow up.        No call back required; all needs have been addressed.

## 2023-12-18 NOTE — Unmapped (Signed)
Patient is A.O. x4. Ambulates independently. PIV not present. AVS discussed with patient. Patient has no questions or concerns at this time. Being discharged home via personal vehicle driven by sister.     Problem: Adult Inpatient Plan of Care  Goal: Plan of Care Review  Outcome: Discharged to Home  Goal: Patient-Specific Goal (Individualized)  Outcome: Discharged to Home  Goal: Absence of Hospital-Acquired Illness or Injury  Outcome: Discharged to Home  Intervention: Identify and Manage Fall Risk  Recent Flowsheet Documentation  Taken 12/18/2023 1400 by Alvino Chapel, RN  Safety Interventions: fall reduction program maintained  Taken 12/18/2023 1200 by Alvino Chapel, RN  Safety Interventions: fall reduction program maintained  Taken 12/18/2023 1000 by Alvino Chapel, RN  Safety Interventions: fall reduction program maintained  Taken 12/18/2023 0800 by Alvino Chapel, RN  Safety Interventions: fall reduction program maintained  Goal: Optimal Comfort and Wellbeing  Outcome: Discharged to Home  Goal: Readiness for Transition of Care  Outcome: Discharged to Home  Goal: Rounds/Family Conference  Outcome: Discharged to Home     Problem: Self-Care Deficit  Goal: Improved Ability to Complete Activities of Daily Living  Outcome: Discharged to Home     Problem: Fall Injury Risk  Goal: Absence of Fall and Fall-Related Injury  Outcome: Discharged to Home  Intervention: Promote Injury-Free Environment  Recent Flowsheet Documentation  Taken 12/18/2023 1400 by Alvino Chapel, RN  Safety Interventions: fall reduction program maintained  Taken 12/18/2023 1200 by Alvino Chapel, RN  Safety Interventions: fall reduction program maintained  Taken 12/18/2023 1000 by Alvino Chapel, RN  Safety Interventions: fall reduction program maintained  Taken 12/18/2023 0800 by Alvino Chapel, RN  Safety Interventions: fall reduction program maintained     Problem: Malnutrition  Goal: Improved Nutritional Intake  Outcome: Discharged to Home

## 2023-12-18 NOTE — Unmapped (Signed)
PT A&Ox4, medication given as ordered referring to [facility].  Pt needed bolus of LR, completed at 1617.  Call light within reach, bed in low position.    Problem: Adult Inpatient Plan of Care  Goal: Plan of Care Review  Outcome: Progressing  Flowsheets (Taken 12/17/2023 1607)  Progress: improving  Plan of Care Reviewed With: patient  Goal: Patient-Specific Goal (Individualized)  Outcome: Progressing  Goal: Absence of Hospital-Acquired Illness or Injury  Outcome: Progressing  Goal: Optimal Comfort and Wellbeing  Outcome: Progressing  Goal: Readiness for Transition of Care  Outcome: Progressing  Goal: Rounds/Family Conference  Outcome: Progressing     Problem: Self-Care Deficit  Goal: Improved Ability to Complete Activities of Daily Living  Outcome: Progressing     Problem: Fall Injury Risk  Goal: Absence of Fall and Fall-Related Injury  Outcome: Progressing     Problem: Malnutrition  Goal: Improved Nutritional Intake  Outcome: Progressing

## 2023-12-18 NOTE — Unmapped (Signed)
Pt alert and oriented x4. Independent in the room. Ambien given to aid in sleep with some positive effects. Bed locked and in lowest position. Call bell in reach. Care plan ongoing.              Problem: Adult Inpatient Plan of Care  Goal: Plan of Care Review  Outcome: Ongoing - Unchanged  Goal: Patient-Specific Goal (Individualized)  Outcome: Ongoing - Unchanged  Goal: Absence of Hospital-Acquired Illness or Injury  Outcome: Ongoing - Unchanged  Intervention: Identify and Manage Fall Risk  Recent Flowsheet Documentation  Taken 12/17/2023 2200 by Chinita Pester, RN  Safety Interventions: fall reduction program maintained  Taken 12/17/2023 2000 by Chinita Pester, RN  Safety Interventions: fall reduction program maintained  Goal: Optimal Comfort and Wellbeing  Outcome: Ongoing - Unchanged  Goal: Readiness for Transition of Care  Outcome: Ongoing - Unchanged  Goal: Rounds/Family Conference  Outcome: Ongoing - Unchanged     Problem: Self-Care Deficit  Goal: Improved Ability to Complete Activities of Daily Living  Outcome: Ongoing - Unchanged     Problem: Fall Injury Risk  Goal: Absence of Fall and Fall-Related Injury  Outcome: Ongoing - Unchanged  Intervention: Promote Injury-Free Environment  Recent Flowsheet Documentation  Taken 12/17/2023 2200 by Chinita Pester, RN  Safety Interventions: fall reduction program maintained  Taken 12/17/2023 2000 by Chinita Pester, RN  Safety Interventions: fall reduction program maintained     Problem: Malnutrition  Goal: Improved Nutritional Intake  Outcome: Ongoing - Unchanged

## 2023-12-19 NOTE — Unmapped (Signed)
Physician Discharge Summary HBR  2 BT2 [facility]  [address]  [place] [address]  Dept: [phone]  Loc: [phone]     Identifying Information:   Ashley Briggs  12-29-1965  [id number]    Primary Care Physician: Karie Georges Pap, DO   Code Status: Full Code    Admit Date: 12/15/2023    Discharge Date: 12/18/2023     Discharge To: Home with Home Health and/or PT/OT    Discharge Service: [facility] - [facility]     Discharge Attending Physician: No att. providers found    Discharge Diagnoses:  Principal Problem:    Dizziness  Active Problems:    Type 2 diabetes mellitus, with long-term current use of insulin (CMS-HCC)    Hyperlipidemia    Hypertension    Esophageal varices in cirrhosis (CMS-HCC)    Hepatic cirrhosis (CMS-HCC)    Secondary esophageal varices without bleeding (CMS-HCC)    Other insomnia    Hyperkalemia    Stage 3a chronic kidney disease (CMS-HCC)      Outpatient Provider Follow Up Issues:   [ ]  F/up [facility]    Hospital Course:   Ashley Briggs is a 58 y.o. female with a PMH of T2DM, CAD, decompensated NASH cirrhosis w/ esophageal varices and hx of hepatic encephalopathy, calcified cerebral meningioma, hyperlipidemia, hypertension, psoriasis who presents with confusion, dizziness, facial numbness in the past week.      # Hepatic encephalopathy  # Polypharmacy  # Hypovolemia  # Confusion - Dizziness  Ongoing for the past month with acute worsening 4 days ago. Has documented report of these symptoms in the past (01/2023). Also documented syncopal episodes in last admission in December 2024. Low concern for worsening of mengioma as CT scan showed stable appearance. Low concern for thyroid element to dizziness as TSH is stable. May be secondary to over diuresis given low Na and BP. May be a component of hepatic encephalopathy since pt was only having 3 BM's per day with home dose of lactulose. Her symptoms improved with an additional dose of lactulose. Polypharmacy may also be contributing to symptoms. Recommend discharging on lactulose 30 g QID with goal 4-5 BM per day. Decreased Lasix to 40 mg daily. Reduced gabapentin to 300 mg nightly. Discontinued scopolamine. Reduced spironolactone 100 mg daily. Reduced baclofen 5 mg daily. PT/OT recommending home health.    # Hyponateremia  Likely secondary to over diuresis. Improved with 250 mg fluid bolus and holding diuretics. Decreased Lasix to 40 mg daily.    Touchbase with Outpatient Provider:  Warm Handoff: Completed on 12/18/23 by Nestor Lewandowsky, MD  (Resident) via [facility]    Procedures:  No admission procedures for hospital encounter.  ______________________________________________________________________  Discharge Medications:     Your Medication List        STOP taking these medications      clotrimazole 10 mg troche  Commonly known as: MYCELEX     scopolamine 1 mg over 3 days  Commonly known as: TRANSDERM-SCOP            CHANGE how you take these medications      baclofen 5 mg Tab tablet  Commonly known as: LIORESAL  Take 1 tablet (5 mg total) by mouth nightly.  What changed:   how much to take  how to take this  when to take this  additional instructions     furosemide 40 MG tablet  Commonly known as: LASIX  Take 1 tablet (40 mg total)  by mouth daily.  What changed: when to take this     gabapentin 300 MG capsule  Commonly known as: NEURONTIN  Take 1 capsule (300 mg total) by mouth nightly.  What changed: when to take this     lactulose 10 gram/15 mL solution  Take 45 mL (30 g total) by mouth four (4) times a day.  What changed: how much to take     spironolactone 100 MG tablet  Commonly known as: ALDACTONE  Take 1 tablet (100 mg total) by mouth daily.  What changed: when to take this            CONTINUE taking these medications      ACCU-CHEK SOFTCLIX LANCETS lancets  Generic drug: lancets  Use as directed to test once daily.     acetaminophen 325 MG tablet  Commonly known as: TYLENOL  Take 2 tablets (650 mg total) by mouth two (2) times a day as needed for pain.     aspirin 81 MG tablet  Commonly known as: ECOTRIN  Take 1 tablet (81 mg total) by mouth nightly.     blood sugar diagnostic Strp  by Other route Three (3) times a day before meals. For glucometer brand preferred by patient     buPROPion 150 MG 24 hr tablet  Commonly known as: Wellbutrin XL  Take 1 tablet (150 mg total) by mouth nightly.     canagliflozin 300 mg Tab tablet  Commonly known as: INVOKANA  Take 1 tablet (300 mg total) by mouth daily before breakfast.     carvedilol 6.25 MG tablet  Commonly known as: COREG  Take 1 tablet (6.25 mg total) by mouth two (2) times a day.     chlorhexidine 0.12 % solution  Commonly known as: PERIDEX  15 mL by Mouth route daily as needed.     COSENTYX PEN (2 PENS) 150 mg/mL Pnij injection  Generic drug: secukinumab  Inject the contents of 2 pens (300 mg total) under the skin every twenty-eight (28) days. Maintenance dose     empty container Misc  Use as directed to dispose of Cosentyx pens.     FREESTYLE LIBRE 3 SENSOR  Generic drug: blood-glucose sensor  Use 1 sensor every 15 days     insulin glargine 100 unit/mL (3 mL) injection pen  Commonly known as: BASAGLAR, LANTUS  Inject 0.36 mL (36 Units total) under the skin nightly.     insulin lispro 100 unit/mL injection pen  Commonly known as: HumaLOG  Inject 8 Units under the skin Three (3) times a day before meals.     magnesium oxide 400 mg (241.3 mg elemental) tablet  Commonly known as: MAG-OX  Take 2 tablets (800 mg total) by mouth two (2) times a day.     metFORMIN 500 MG 24 hr tablet  Commonly known as: GLUCOPHAGE-XR  Take 1 tablet (500 mg total) by mouth Two (2) times a day (30 minutes before a meal).     mirtazapine 15 MG tablet  Commonly known as: REMERON  Take 1 tablet (15 mg total) by mouth nightly.     naltrexone 50 mg tablet  Commonly known as: DEPADE  Take 1 tablet (50 mg total) by mouth nightly.     NERVIVE PAIN RELIEVING 4-1 % Srlo  Generic drug: lidocaine HCL-menthoL  Apply topically. pantoprazole 40 MG tablet  Commonly known as: Protonix  Take 1 tablet (40 mg total) by mouth two (2) times a day.  pen needle, diabetic 32 gauge x 1/4 (6 mm) Ndle  Commonly known as: ULTICARE PEN NEEDLE  Inject 1 each under the skin nightly.     rifAXIMin 550 mg Tab  Commonly known as: XIFAXAN  Take 1 tablet (550 mg total) by mouth two (2) times a day.     simvastatin 20 MG tablet  Commonly known as: ZOCOR  Take 1 tablet (20 mg total) by mouth every evening.     zolpidem 5 MG tablet  Commonly known as: AMBIEN  Take 1 tablet (5 mg total) by mouth nightly as needed.              Allergies:  Patient has no known allergies.  ______________________________________________________________________  Pending Test Results (if blank, then none):      Most Recent Labs:  All lab results last 24 hours -   Recent Results (from the past 24 hours)   POCT Glucose    Collection Time: 12/17/23  8:43 PM   Result Value Ref Range    Glucose, POC 279 (H) 70 - 179 mg/dL   CBC    Collection Time: 12/18/23  5:45 AM   Result Value Ref Range    WBC 4.6 3.6 - 11.2 10*9/L    RBC 3.72 (L) 3.95 - 5.13 10*12/L    HGB 8.7 (L) 11.3 - 14.9 g/dL    HCT 22.9 (L) 79.8 - 44.0 %    MCV 74.4 (L) 77.6 - 95.7 fL    MCH 23.5 (L) 25.9 - 32.4 pg    MCHC 31.6 (L) 32.0 - 36.0 g/dL    RDW 92.1 (H) 19.4 - 15.2 %    MPV 8.3 6.8 - 10.7 fL    Platelet 138 (L) 150 - 450 10*9/L   Comprehensive Metabolic Panel    Collection Time: 12/18/23  5:45 AM   Result Value Ref Range    Sodium 130 (L) 135 - 145 mmol/L    Potassium 4.3 3.4 - 4.8 mmol/L    Chloride 93 (L) 98 - 107 mmol/L    CO2 21.2 20.0 - 31.0 mmol/L    Anion Gap 16 (H) 5 - 14 mmol/L    BUN 27 (H) 9 - 23 mg/dL    Creatinine 1.74 (H) 0.55 - 1.02 mg/dL    BUN/Creatinine Ratio 25     eGFR CKD-EPI (2021) Female 60 >=60 mL/min/1.17m2    Glucose 210 (H) 70 - 179 mg/dL    Calcium 9.3 8.7 - 08.1 mg/dL    Albumin 2.3 (L) 3.4 - 5.0 g/dL    Total Protein 8.3 (H) 5.7 - 8.2 g/dL    Total Bilirubin 4.8 (H) 0.3 - 1.2 mg/dL AST 85 (H) <=44 U/L    ALT 36 10 - 49 U/L    Alkaline Phosphatase 348 (H) 46 - 116 U/L   POCT Glucose    Collection Time: 12/18/23  7:28 AM   Result Value Ref Range    Glucose, POC 204 (H) 70 - 179 mg/dL   POCT Glucose    Collection Time: 12/18/23 11:40 AM   Result Value Ref Range    Glucose, POC 329 (H) 70 - 179 mg/dL       Relevant Studies/Radiology (if blank, then none):  ECG 12 Lead  Result Date: 12/15/2023  NORMAL SINUS RHYTHM INFERIOR INFARCT , AGE UNDETERMINED CANNOT RULE OUT ANTERIOR INFARCT  , AGE UNDETERMINED ABNORMAL ECG WHEN COMPARED WITH ECG OF 20-Nov-2023 20:50, INFERIOR INFARCT IS NOW PRESENT Confirmed by Warnell Forester ([id number])  on 12/15/2023 8:25:03 PM    CT Head Wo Contrast  Result Date: 12/15/2023  EXAM: Computed tomography, head or brain without contrast material. ACCESSION: [id number] UN CLINICAL INDICATION: 58 years old Female with DIZZINESS  COMPARISON: Maxillofacial CT 10/16/2023, MRI. 05/03/2023 TECHNIQUE: Axial CT images of the head  from skull base to vertex without contrast. FINDINGS: The gray-white matter differentiation is intact. There is no evidence of acute infarct, hemorrhage, mass or mass effect. The ventricles and extra-axial spaces appear unremarkable. The visualized paranasal sinuses are clear. The mastoid air cells are aerated. No acute calvarial fracture is identified. Stable appearing calcified mass within the planum sphenoidale. Carotid siphon calcifications.     No acute intracranial abnormality. 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    ______________________________________________________________________  Discharge Instructions:           Other Instructions       Discharge instructions      You were seen in the hospital for dizziness, confusion, and facial numbness. These symptoms were likely caused by a multiple factors including:     1. Your home dose of lactulose may have been too low, which can lead to confusion. For this, we increased your home dose of lactulose to 30 g four times per day. The goal is to have 4-5 bowel movements per day. If you are having more than that, you may decrease the amount of lactulose you take.     2. Your blood pressure was low, which can cause dizziness. This may have happened due to taking too high a dose of your Lasix. We decreased your Lasix to 40 mg once per day and spironolactone to 100 mg daily.     3. You were taking multiple medications that could cause dizziness and confusion. We discontinued your scopolamine, decreased you baclofen to 5 mg nightly, and decreased your gabapentin to 300 mg nightly.            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    Home Health Options: Traditional Home Health    Is this patient at high risk for COVID 19 transmission and recommended to   stay at home during this pandemic?: No    If the patient has a diagnosis of heart failure and is already on oral   diuretics, do you want to activate the in home IV Lasix protocol?: No    Do you want agency provider parameter notifications or patient specific   provider parameter notifications?: Agency    Do you want to initiate remote patient monitoring?: No    Physician to follow patient's care: PCP    Disciplines requested:  Physical Therapy  Occupational Therapy       Physical Therapy requested:  Home safety evaluation  Evaluate and treat       Occupational Therapy Requested:  Home safety evaluation  Evaluate and treat       Requested SOC Date:  Comment - within [facility]  Discharge instructions          Appointments which have been scheduled for you      Dec 22, 2023 10:30 AM  (Arrive by 10:00 AM)  RETURN  HEPATOLOGY with Kennedy Bucker, MD  [facility] LIVER TRANSPLANT [facility] REGION) [address]  [place] [address]  [phone]        Dec 24, 2023 10:00 AM  (Arrive by 9:45 AM)  RETURN CONTINUITY with Benison Pap Rosine Beat, DO  [facility] PRIMARY CARE [address] AT [facility] REGION) [address] [place] [address]  [phone]        Dec 28, 2023 1:30 PM  (Arrive by 1:00 PM)  Fluoroscopy Barium Swallow Modified with Onnie Boer RM 7  [facility]) [address]  [place] [address]  [phone]   Preparing for the appointment:  There are a few things you need to know before coming to the hospital:  Please bring any recent lab work from outside facilities.  Most medications that you would normally take should still be taken as scheduled the day of your  imaging appointment/procedure. Do not stop taking any medications without talking to a  physician first.  If you are diabetic, please check with your physician regarding your diabetic medications for the  day of procedure since you will not be eating prior to procedure.  If you wear an appliance/ostomy bag, please bring an additional bag in case a change is  necessary.  Please let us know if you:  Are pregnant, suspect you might be pregnant or are breast feeding  Are allergic to iodine or other contrast dyes used in imaging exams  Had a barium x-ray procedure recently      Fasting before the appointment:    Patients 2-69 years of age  Do not eat, drink or give tube feeds 3 hrs prior to your exam  Arrive 1 hr prior to your exam time  Your child????s exam may need to be rescheduled if the above preparations are not followed.    Patients 66 years of age and older  Please do not eat or drink anything 1 hr prior to exam         Dec 28, 2023 1:30 PM  (Arrive by 1:00 PM)  MODIFIED BARIUM SWALLOW EVAL with Lucila Maine, SLP  [facility] ADULT SPEECH THERAPY [facility] REGION) [address]  [place] [address]  [phone]        Jan 19, 2024  ESOPHAGEAL MOTILITY STUDY W/INT & REP, ESOPH FUNCT TST NASL ELEC PLCMT with Nurse-Based Giproc  GI PERIOP [facility] REGION) [address]  [place] [address]  [phone]     Feb 01, 2024 12:00 PM  (Arrive by 11:45 AM)  RETURN  ESO with Pevely Sever, NP  [facility] GI MEDICINE [place] [facility] REGION) [address]  [facility] [address] through [address]  [place] [address]  [phone]        Mar 03, 2024 8:20 AM  (Arrive by 8:05 AM)  RETURN PODIATRY with Jearl Klinefelter, DPM  [facility] HEART VASCULAR CTR PODIATRY [place] [facility] REGION) [address]  [place] [address]  [phone]        Jun 02, 2024 11:15 AM  (Arrive by 11:00 AM)  MRI Brain With and Without Contrast with [facility] MRI MBL  [facility]) [address]  [place] [address]  [phone]   On appt date:  Bring recent lab work  Bring documentation of any metal object implants  Take meds as usual  Check w/physician if diabetic  You will be asked to change into a gown for your safety    On appt date do not:  No restrictions on food/drink  Wear metallic items including jewelry (we are not responsible for lost items)    Let us know if pt:  Claustrophobic  Metal object implant  Pregnant  Prescribed a sedative  On dialysis  Allergic to MRI dye/contrast  Kidney Failure    (Title:MRIWCNTRST)              ______________________________________________________________________  Discharge Day Services:  BP 124/66  - Pulse 74  - Temp 36.5 ??C (97.7 ??F) (Oral)  - Resp 18  - Ht 162.6 cm (5' 4)  - Wt 88.5 kg (195 lb 1.6 oz)  - LMP 08/03/2006  - SpO2 100%  - BMI 33.49 kg/m??   Pt seen on the day of discharge and determined appropriate for discharge.    GEN: well appearing, lying in bed, NAD   HEENT: NCAT, No scleral icterus. Conjunctiva non-erythematous. MMM.  CV: Regular rate and rhythm. No murmurs/rubs/gallops.  Pulm: Normal work of breathing on room air. CTAB. No wheezing, crackles, or rhonchi.  Abd: Flat.  Nontender. No guarding, rebound.  Normoactive bowel sounds.    Neuro: A&O x 3. No focal deficits. No asterixis.   Ext: No peripheral edema.  Palpable distal pulses.  Skin: Jaundice.    Condition at Discharge: good    Length of Discharge: I spent greater than 30 mins in the discharge of this patient.

## 2023-12-19 NOTE — Progress Notes (Signed)
No show

## 2023-12-21 ENCOUNTER — Ambulatory Visit (HOSPITAL_BASED_OUTPATIENT_CLINIC_OR_DEPARTMENT_OTHER): Payer: Medicaid Other | Admitting: Pain Medicine

## 2023-12-21 DIAGNOSIS — Z91199 Patient's noncompliance with other medical treatment and regimen due to unspecified reason: Secondary | ICD-10-CM

## 2023-12-21 DIAGNOSIS — G894 Chronic pain syndrome: Secondary | ICD-10-CM

## 2023-12-21 DIAGNOSIS — M545 Low back pain, unspecified: Secondary | ICD-10-CM

## 2023-12-21 DIAGNOSIS — G8929 Other chronic pain: Secondary | ICD-10-CM

## 2023-12-23 DIAGNOSIS — Z794 Long term (current) use of insulin: Principal | ICD-10-CM

## 2023-12-23 DIAGNOSIS — E113212 Type 2 diabetes mellitus with mild nonproliferative diabetic retinopathy with macular edema, left eye: Principal | ICD-10-CM

## 2023-12-24 ENCOUNTER — Ambulatory Visit: Payer: Medicaid Other | Admitting: Professional Counselor

## 2023-12-24 NOTE — Unmapped (Signed)
[facility] has made a third and final attempt to reach this patient to refill the following medication: Cosentyx .      We have left voicemails on the following phone numbers: [phone] and have sent a MyChart message.    Dates contacted: 12/19, 12/31, 1/23  Last scheduled delivery: 11/6 for 1 month supply - all 5 loading doses. If started right away, first maintenance dose would have been due on 1/1    The patient may be at risk of non-compliance with this medication. The patient should call the [facility] at [phone]  Option 4, then Option 2: Dermatology, Gastroenterology, Rheumatology to refill medication.    Yaris Ferrell [facility] Specialty Pharmacist

## 2023-12-24 NOTE — Unmapped (Signed)
Scheduler contacted pt for PT SOC, no answer. Left VM(s)

## 2023-12-25 ENCOUNTER — Telehealth (INDEPENDENT_AMBULATORY_CARE_PROVIDER_SITE_OTHER): Payer: Self-pay | Admitting: Psychiatry

## 2023-12-25 DIAGNOSIS — Z91199 Patient's noncompliance with other medical treatment and regimen due to unspecified reason: Secondary | ICD-10-CM

## 2023-12-25 NOTE — Unmapped (Signed)
Scheduler contacted pt for PT SOC, no answer. Left VM(s)

## 2023-12-28 DIAGNOSIS — G6289 Other specified polyneuropathies: Principal | ICD-10-CM

## 2023-12-28 NOTE — Unmapped (Signed)
3x contact NON-ADMIT

## 2023-12-29 DIAGNOSIS — K7460 Unspecified cirrhosis of liver: Principal | ICD-10-CM

## 2023-12-29 DIAGNOSIS — E113212 Type 2 diabetes mellitus with mild nonproliferative diabetic retinopathy with macular edema, left eye: Principal | ICD-10-CM

## 2023-12-29 DIAGNOSIS — Z794 Long term (current) use of insulin: Principal | ICD-10-CM

## 2023-12-29 DIAGNOSIS — I8510 Secondary esophageal varices without bleeding: Principal | ICD-10-CM

## 2023-12-29 MED ORDER — CARVEDILOL 6.25 MG TABLET
ORAL_TABLET | Freq: Two times a day (BID) | ORAL | 11 refills | 30.00 days
Start: 2023-12-29 — End: ?

## 2023-12-29 MED ORDER — INVOKANA 300 MG TABLET
ORAL_TABLET | 0 refills | 0.00 days
Start: 2023-12-29 — End: ?

## 2023-12-29 MED ORDER — CANAGLIFLOZIN 300 MG TABLET
ORAL_TABLET | Freq: Every day | ORAL | 0 refills | 90.00 days
Start: 2023-12-29 — End: 2024-12-28

## 2023-12-29 MED ORDER — ASPIRIN 81 MG TABLET,DELAYED RELEASE
ORAL_TABLET | Freq: Every evening | ORAL | 0 refills | 30.00 days | Status: CP
Start: 2023-12-29 — End: ?
  Filled 2024-01-04: qty 30, 30d supply, fill #0

## 2023-12-29 NOTE — Unmapped (Signed)
Refill request for carvedilol received from [facility] pharmacy. Last script sent to [facility] in [place] on 06/26/23 with 11 refills.     This RN contacted patient to clarify if she wants to switch to [facility] pharmacy or continue with [facility] as she has refills available with [facility].     LVM for her to return call.     Shon Baton, MSN-Ed, RN,   Nursing Coordinator for   Dr. Eudelia Bunch & Betsey Holiday., PA-C  [facility]   [phone]

## 2023-12-29 NOTE — Unmapped (Signed)
Patient is requesting the following refill  Requested Prescriptions     Pending Prescriptions Disp Refills    aspirin (ECOTRIN) 81 MG tablet  0     Sig: Take 1 tablet (81 mg total) by mouth nightly.       Recent Visits  Date Type Provider Dept   12/07/23 Office Visit Mangel, Benison Pap, DO [place] [facility] [address] [facility]   10/26/23 Office Visit Mangel, Benison Pap, DO [place] [facility] [address] [facility]   08/31/23 Office Visit Tessie Eke, MD [place] [facility] [address] [facility]   05/08/23 Office Visit Mangel, Benison Pap, DO [place] [facility] [address] [facility]   02/27/23 Office Visit Mangel, Benison Pap, DO [place] [facility] [address] [facility]   01/01/23 Office Visit Mangel, Benison Pap, DO  Primary Care [address] At [facility]   Showing recent visits within past 365 days and meeting all other requirements  Future Appointments  No visits were found meeting these conditions.  Showing future appointments within next 365 days and meeting all other requirements       Labs: Not applicable this refill

## 2023-12-30 MED ORDER — CANAGLIFLOZIN 300 MG TABLET
ORAL_TABLET | Freq: Every day | ORAL | 0 refills | 90.00 days
Start: 2023-12-30 — End: 2024-12-29

## 2023-12-30 MED ORDER — INVOKANA 300 MG TABLET
ORAL_TABLET | 0 refills | 0.00 days | Status: CP
Start: 2023-12-30 — End: ?

## 2023-12-30 NOTE — Unmapped (Signed)
Duplicate delete

## 2023-12-31 MED ORDER — CARVEDILOL 6.25 MG TABLET
ORAL_TABLET | Freq: Two times a day (BID) | ORAL | 11 refills | 30.00 days
Start: 2023-12-31 — End: ?

## 2024-01-04 ENCOUNTER — Ambulatory Visit: Admit: 2024-01-04 | Discharge: 2024-01-05 | Payer: PRIVATE HEALTH INSURANCE

## 2024-01-04 DIAGNOSIS — R569 Unspecified convulsions: Principal | ICD-10-CM

## 2024-01-04 DIAGNOSIS — G6289 Other specified polyneuropathies: Principal | ICD-10-CM

## 2024-01-04 MED ORDER — LEVETIRACETAM 500 MG TABLET
ORAL_TABLET | Freq: Two times a day (BID) | ORAL | 1 refills | 90.00 days | Status: CP
Start: 2024-01-04 — End: 2024-07-02

## 2024-01-04 NOTE — Unmapped (Signed)
Outpatient Neurology Consult Note     [facility]  [address]  [address]  [place] [address]    Date: 01/04/2024  Patient Name: Ashley Briggs  MRN: [id number]  PCP: Karie Georges Pap  Jearl Klinefelter,*     Assessment and Plan          [name] is a 58 y.o.  female  with PMH of planum sphenoidale meningioma, anxiety, chronic pain disorder, CAD, DM, NASH cirrhosis, seen in consultation at the [place] [facility] at the request of Dr. Kandis Ban for evaluation of neuropathy. Her chief complaint today is concern she is having seizures.     C/f seizures:  Recurrent, stereotyped episodes of full body convulsions at times with urinary incontinence and confusion. Had 3 brief events (~5 mins) in past 4 months and one prolonged episode of full body shaking lasting an entire night in January 2025. Patient reportedly with retained awareness which would be unusual with generalized tonic clonic seizure, however patient with incomplete recollection of past events and her planum sphenoidale meningioma is close to orbitofrontal cortex which can cause atypical seizures. Suspect some degree of intermittent myoclonus and tremors from liver disease, AKI, and polypharmacy but these have improved with most recent medication adjustments in January 2025 and patient without myoclonus or encephalopathy on my exam today.     Plan:  - start keppra 750 mg BID  - routine seizure precautions including no driving for 6 months after last seizure  - routine EEG to assess for interictal discharges, if negative will discuss with epilepsy team utility of prolonged EEG monitoring (ambulatory vs EMU) and need for adjustments of standard electrodes to better assess for deep seizure foci (location of meningioma at planum sphenoidale)  - pt to discuss with her psychiatrists to see if it is feasible to try an alternative to wellbutrin as it can lower seizure threshold  - surveillance imaging per neurosurgery (last MRI brain w/wo 05/03/23 was stable)      # Neuropathy:  Original reason for referral, referred by podiatry. Patient with evidence of length dependent neuropathy typical of diabetes. We discussed that increasing gabapentin and/or baclofen further may do more harm than good as her most recent episode of encephalopathy and gait imbalance improved after reducing dose of gabapentin.     Plan:  - continue gabapentin 300 mg nightly, baclofen 5-10 mg daily  - can uptitrate gabapentin if pain worsens if AKI fully resolves  - cymbalta may be a nonsedating alternative especially if her psychiatrist can wean her off wellbutrin    Return Visit Discussed: Return in about 6 months (around 07/03/2024).    I personally spent 45 minutes face-to-face and non-face-to-face in the care of this patient, which includes all pre, intra, and post visit time on the date of service.  All documented time was specific to the E/M visit and does not include any procedures that may have been performed.         Patient was discussed with Dr.Bozoki who agrees with assessment and plan.      Gaynelle Arabian, MD  PGY-4  Department of Neurology           HPI         HPI: [name] is a 58 y.o.  female  with PMH of planum sphenoidale meningioma, anxiety, chronic pain disorder, CAD, DM, NASH cirrhosis, seen in consultation at  the [facility] at the request of Dr. Kandis Ban for evaluation of neuropathy. Her chief complaint today is concern she is having seizures.     Admitted to hospital 1/14-1/17 for dizziness, confusion, numbness below the yes on both sides. She was found to have hyperkalemia, hyponatremia, and AKI. There was also some concern for HE given her history of NASH. They increased her lactulose and decreased lasix to 40 mg, and gabapentin 300 mg nightly, discontinued scopolamine, reduced spiro to 100 and baclofen to 5 mg daily (although continues to take 10 mg daily as she did not know she was supposed to take lower dose).    Continues to have about 2 bowel movements a day, goal is 4-5 BM a day. She will talk to her liver doctor about further titration of her lactulose.     She is concerned she may be having seizures.     Jan 17th got out of hospital and got home. Started feeling weird and nauseated went to lay in the bed, within 5 mins started having uncontrollable BUE and BLE shaking without loss of consciousness. She did have urinary incontinence but no tongue bite. She said she had shaking for the entire night and did not lose consciousness. She states she had full body nonsuppressible jerking that is different from her regular tremors from her liver disease.     Also reports 3 smaller episodes over past 4 months. Most recently, last night had an episode where she felt something was not right and went to lay down. She then started having full body shaking (BUE and BLE) lasting for about 5 mins; no urinary incontinence or tongue bite. Had 2 similar episodes each lasting 5 mins or less over the preceding 3-4 months.     Jan 3rd she fell for no reason, everything went black for about a minute and urinated on herself. Was confused afterwards for hours but reportedly no convulsions.     Also reports intermittent episodes of outer edges of both eyes going dark or hazy. Sometimes preceding her seizure-like episodes.     Also reports gradually worsening hearing loss and auditory hallucinations of people talking. Denies voices telling her bad things or SI.     Continues to have intermittent daily headaches behind her eyes. Also symmetric numbness over face below her eyes involving cheeks and lips, symmetric.    Bgs have been on the higher end 300-400 she will notify her endocrinologis.t     She is driving, car broke down and she is scared to drive with everything going on.     Neurosurgery last saw her in 2024 and did not recommend intervention. MRI 05/03/23 was stable. She said she is due to see neurosurgey in June.         Past Medical History:   Diagnosis Date    Anemia     Angular blepharoconjunctivitis of both eyes 12/21/2020    Anxiety     Arthritis     Ascites     Blepharitis     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    Cataract associated with type 2 diabetes mellitus (CMS-HCC) 12/21/2020    Chronic headaches     Chronic pain     Chronic pain disorder 2029    I have multiple issues. Seeing doctor's regarding  issues    Cirrhosis (CMS-HCC)     Coronary artery disease involving native heart without angina pectoris 02/05/2021    Current moderate episode of major  depressive disorder (CMS-HCC) 11/06/2022    Diabetes mellitus (CMS-HCC)     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 type 2 diabetes mellitus (CMS-HCC) 12/02/2022    Dry eye syndrome, bilateral 12/21/2020    Dysphagia 02/02/2023    Endometrial cancer (CMS-HCC) 2007    Esophageal varices in cirrhosis (CMS-HCC) 06/17/2022    Fractures 01/29/2018    Gastroparesis     Generalized pruritus 02/02/2023    GERD (gastroesophageal reflux disease) It's  been going on for years    I take medication  which helps tremendously    Glaucoma 2023    I see a doctor on a regular basis    H/O diabetic gastroparesis 01/09/2021    Heart disease     Hemorrhage of rectum and anus 03/17/2011    Hepatic cirrhosis (CMS-HCC) 06/17/2022    Hepatic encephalopathy (CMS-HCC) 06/17/2022    Hepatic steatosis 03/13/2021    Noted on CT Fall 2021    High cholesterol     Hx of psoriasis 08/06/2022    Hyperlipidemia 12/12/2015    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    Liver disease     Major depression     Metabolic dysfunction-associated steatohepatitis (MASH)     Morbid obesity (CMS-HCC) 11/10/2019    Myopia of both eyes with astigmatism and presbyopia 12/02/2022    Nausea 09/22/2022    Non-alcoholic fatty liver disease NPDR (nonproliferative diabetic retinopathy) (CMS-HCC)     Right eye    Other ascites 09/22/2022    Other chronic pain 11/06/2022    Other insomnia 11/06/2022    PFD (pelvic floor dysfunction) 04/07/2013    Portal hypertension (CMS-HCC) 10/08/2022    Psoriasis     Ptosis of eyelid, left     Left upper eyelid    Reflux     Retinopathy of left eye, background, proliferative 01/09/2021    Right upper quadrant abdominal pain 09/22/2022    Secondary esophageal varices without bleeding (CMS-HCC) 06/17/2022    Type 2 diabetes mellitus, with long-term current use of insulin (CMS-HCC) 12/12/2015    Last Assessment & Plan:   The current medical regimen is effective;  continue present plan and medications.  Patient will continue good management of diabetes especially with weight loss. Hopefully with further weight loss will run into the problem of being overmedicated and having to cut back on medications     Some confusion on diabetes medications was have not.been refilled for over a year.  Pa       No Known Allergies     Current Outpatient Medications   Medication Sig Dispense Refill    acetaminophen (TYLENOL) 325 MG tablet Take 2 tablets (650 mg total) by mouth two (2) times a day as needed for pain. 180 tablet 1    aspirin (ECOTRIN) 81 MG tablet Take 1 tablet (81 mg total) by mouth nightly. 30 tablet 0    baclofen (LIORESAL) 5 mg Tab tablet Take 1 tablet (5 mg total) by mouth nightly. 30 tablet 0    blood sugar diagnostic Strp by Other route Three (3) times a day before meals. For glucometer brand preferred by patient 100 strip 6    blood-glucose sensor (FREESTYLE LIBRE 3 SENSOR) Devi Use 1 sensor every 15 days 6 each 3    buPROPion (WELLBUTRIN XL) 150 MG 24 hr tablet Take 1 tablet (150 mg total) by mouth nightly.      carvedilol (COREG) 6.25 MG tablet  Take 1 tablet (6.25 mg total) by mouth two (2) times a day. 60 tablet 11    chlorhexidine (PERIDEX) 0.12 % solution 15 mL by Mouth route daily as needed.      empty container Misc Use as directed to dispose of Cosentyx pens. 1 each 2    furosemide (LASIX) 40 MG tablet Take 1 tablet (40 mg total) by mouth daily. 30 tablet 0    gabapentin (NEURONTIN) 300 MG capsule Take 1 capsule (300 mg total) by mouth nightly. 30 capsule 0    insulin lispro (HUMALOG) 100 unit/mL injection pen Inject 8 Units under the skin Three (3) times a day before meals. 15 mL 3    INVOKANA 300 mg Tab tablet TAKE 1 TABLET BY MOUTH ONCE DAILY BEFORE BREAKFAST 90 tablet 0    lactulose 10 gram/15 mL solution Take 45 mL (30 g total) by mouth four (4) times a day. 5400 mL 0    lancets (ACCU-CHEK SOFTCLIX LANCETS) Misc Use as directed to test once daily. 100 each 1    lidocaine HCL-menthoL (NERVIVE PAIN RELIEVING) 4-1 % srlo Apply topically.      magnesium oxide (MAG-OX) 400 mg (241.3 mg elemental magnesium) tablet Take 2 tablets (800 mg total) by mouth two (2) times a day. 120 tablet 11    metFORMIN (GLUCOPHAGE-XR) 500 MG 24 hr tablet Take 1 tablet (500 mg total) by mouth Two (2) times a day (30 minutes before a meal). 360 tablet 0    mirtazapine (REMERON) 15 MG tablet Take 1 tablet (15 mg total) by mouth nightly.      naltrexone (DEPADE) 50 mg tablet Take 1 tablet (50 mg total) by mouth nightly.      pen needle, diabetic (ULTICARE PEN NEEDLE) 32 gauge x 1/4 (6 mm) Ndle Inject 1 each under the skin nightly. 100 each 1    rifAXIMin (XIFAXAN) 550 mg Tab Take 1 tablet (550 mg total) by mouth two (2) times a day. 60 tablet 11    secukinumab (COSENTYX PEN, 2 PENS,) 150 mg/mL PnIj injection Inject the contents of 2 pens (300 mg total) under the skin every twenty-eight (28) days. Maintenance dose 2 mL 5    simvastatin (ZOCOR) 20 MG tablet Take 1 tablet (20 mg total) by mouth every evening. 90 tablet 0    spironolactone (ALDACTONE) 100 MG tablet Take 1 tablet (100 mg total) by mouth daily. 30 tablet 0    zolpidem (AMBIEN) 5 MG tablet Take 1 tablet (5 mg total) by mouth nightly as needed.      insulin glargine (BASAGLAR, LANTUS) 100 unit/mL (3 mL) injection pen Inject 0.36 mL (36 Units total) under the skin nightly. 15 mL 3    levETIRAcetam (KEPPRA) 500 MG tablet Take 1.5 tablets (750 mg total) by mouth two (2) times a day. 270 tablet 1     No current facility-administered medications for this visit.       Past Medical History:   Diagnosis Date    Anemia     Angular blepharoconjunctivitis of both eyes 12/21/2020    Anxiety     Arthritis     Ascites     Blepharitis     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    Cataract associated with type 2 diabetes mellitus (CMS-HCC) 12/21/2020    Chronic headaches     Chronic pain     Chronic pain disorder 2029    I have multiple issues. Seeing doctor's regarding  issues    Cirrhosis (  CMS-HCC)     Coronary artery disease involving native heart without angina pectoris 02/05/2021    Current moderate episode of major depressive disorder (CMS-HCC) 11/06/2022    Diabetes mellitus (CMS-HCC)     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 type 2 diabetes mellitus (CMS-HCC) 12/02/2022    Dry eye syndrome, bilateral 12/21/2020    Dysphagia 02/02/2023    Endometrial cancer (CMS-HCC) 2007    Esophageal varices in cirrhosis (CMS-HCC) 06/17/2022    Fractures 01/29/2018    Gastroparesis     Generalized pruritus 02/02/2023    GERD (gastroesophageal reflux disease) It's  been going on for years    I take medication  which helps tremendously    Glaucoma 2023    I see a doctor on a regular basis    H/O diabetic gastroparesis 01/09/2021    Heart disease     Hemorrhage of rectum and anus 03/17/2011    Hepatic cirrhosis (CMS-HCC) 06/17/2022    Hepatic encephalopathy (CMS-HCC) 06/17/2022    Hepatic steatosis 03/13/2021    Noted on CT Fall 2021    High cholesterol     Hx of psoriasis 08/06/2022    Hyperlipidemia 12/12/2015    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    Liver disease     Major depression     Metabolic dysfunction-associated steatohepatitis (MASH)     Morbid obesity (CMS-HCC) 11/10/2019    Myopia of both eyes with astigmatism and presbyopia 12/02/2022    Nausea 09/22/2022    Non-alcoholic fatty liver disease     NPDR (nonproliferative diabetic retinopathy) (CMS-HCC)     Right eye    Other ascites 09/22/2022    Other chronic pain 11/06/2022    Other insomnia 11/06/2022    PFD (pelvic floor dysfunction) 04/07/2013    Portal hypertension (CMS-HCC) 10/08/2022    Psoriasis     Ptosis of eyelid, left     Left upper eyelid    Reflux     Retinopathy of left eye, background, proliferative 01/09/2021    Right upper quadrant abdominal pain 09/22/2022    Secondary esophageal varices without bleeding (CMS-HCC) 06/17/2022    Type 2 diabetes mellitus, with long-term current use of insulin (CMS-HCC) 12/12/2015    Last Assessment & Plan:   The current medical regimen is effective;  continue present plan and medications.  Patient will continue good management of diabetes especially with weight loss. Hopefully with further weight loss will run into the problem of being overmedicated and having to cut back on medications     Some confusion on diabetes medications was have not.been refilled for over a year.  Pa       Past Surgical History:   Procedure Laterality Date    CHOLECYSTECTOMY      HYSTERECTOMY      endometrial cancer    HYSTERECTOMY      OOPHORECTOMY      OTHER SURGICAL HISTORY  Hysterectomy was in 2007. Gallbladder was in 1999 and phrenectomy  was in 2024    Hysterectomy,  gallbladder removed and phrenectomy    PR ANAL PRESSURE RECORD Left 03/31/2013    Procedure: ANORECTAL MANOMETRY;  Surgeon: None None;  [facility];  Service: Gastroenterology    PR BREATH HYDROGEN TEST N/A 03/31/2013    Procedure: BREATH HYDROGEN TEST;  Surgeon: None None;  [facility]: [facility];  Service: Gastroenterology    PR COLSC FLX W/RMVL OF TUMOR  POLYP LESION SNARE TQ N/A 09/06/2020    Procedure: COLONOSCOPY FLEX; W/REMOV TUMOR/LES BY SNARE;  Surgeon: Chriss Driver, MD;  Location: [facility];  Service: Gastroenterology    PR UPPER GI ENDOSCOPY,BIOPSY N/A 04/15/2013    Procedure: UGI ENDOSCOPY; WITH BIOPSY, SINGLE OR MULTIPLE;  Surgeon: Vickii Chafe, MD;  [facility]: [facility];  Service: Gastroenterology    PR UPPER GI ENDOSCOPY,BIOPSY N/A 09/06/2020    Procedure: UGI ENDOSCOPY; WITH BIOPSY, SINGLE OR MULTIPLE;  Surgeon: Chriss Driver, MD;  [facility]: [facility];  Service: Gastroenterology    PR UPPER GI ENDOSCOPY,BIOPSY N/A 05/05/2022    Procedure: UGI ENDOSCOPY; WITH BIOPSY, SINGLE OR MULTIPLE;  Surgeon: Chriss Driver, MD;  Location: [facility];  Service: Gastroenterology    PR UPPER GI ENDOSCOPY,BIOPSY N/A 01/22/2023    Procedure: UGI ENDOSCOPY; WITH BIOPSY, SINGLE OR MULTIPLE;  Surgeon: Vonda Antigua, MD;  [facility]: [facility];  Service: Gastroenterology       Social History     Socioeconomic History    Marital status: Widowed     Spouse name: None    Number of children: None    Years of education: None    Highest education level: None   Tobacco Use    Smoking status: Never     Passive exposure: Current    Smokeless tobacco: Never   Vaping Use    Vaping status: Never Used   Substance and Sexual Activity    Alcohol use: Not Currently     Comment: No longer    Drug use: Not Currently     Types: Marijuana, Codeine, Hydrocodone, Morphine, Oxycodone    Sexual activity: Not Currently     Partners: Male     Birth control/protection: Abstinence   Other Topics Concern    Do you use sunscreen? Yes    Tanning bed use? No    Are you easily burned? Yes    Excessive sun exposure? No    Blistering sunburns? Yes     Comment: When I was in my teens and twenties   Social History Narrative    Widowed    No children    Not currently working:  Previously worked as a Engineer, site     Social Drivers of Psychologist, prison and probation services Strain: Low Risk  (12/16/2023)    Overall Financial Resource Strain (CARDIA)     Difficulty of Paying Living Expenses: Not very hard   Food Insecurity: No Food Insecurity (12/16/2023)    Hunger Vital Sign     Worried About Running Out of Food in the Last Year: Never true     Ran Out of Food in the Last Year: Never true   Transportation Needs: No Transportation Needs (12/16/2023)    PRAPARE - Therapist, art (Medical): No     Lack of Transportation (Non-Medical): No   Physical Activity: Insufficiently Active (01/09/2021)    Exercise Vital Sign     Days of Exercise per Week: 1 day     Minutes of Exercise per Session: 40 min   Stress: Stress Concern Present (09/17/2022)    [employer] of Occupational Health - Occupational Stress Questionnaire     Feeling of Stress : Very much   Social Connections: Moderately Isolated (09/02/2018)    Received from [facility], [facility]    Social Connection and Isolation Panel [NHANES]     Frequency  of Communication with Friends and Family: Once a week     Frequency of Social Gatherings with Friends and Family: Twice a week     Attends Religious Services: Never     Database administrator or Organizations: No     Attends Banker Meetings: Never     Marital Status: Widowed       Family History   Problem Relation Age of Onset    Diabetes Mother     Cancer Mother         Complications during dialysis    Depression Mother     Hypertension Mother     Vision loss Mother     No Known Problems Father     No Known Problems Brother     Diabetes Maternal Grandmother     Hypertension Maternal Grandmother     Alcohol abuse Maternal Grandmother         Heavy drinker    Arthritis Maternal Grandmother     Cancer Maternal Grandfather     Diabetes Maternal Grandfather     Alcohol abuse Maternal Grandfather     Heart disease Maternal Grandfather     No Known Problems Paternal Grandmother     Skin cancer Paternal Grandfather     Stroke Paternal Grandfather     Vision loss Maternal Aunt         Heart Attack    No Known Problems Maternal Uncle     No Known Problems Paternal Aunt     No Known Problems Paternal Uncle     No Known Problems Other     Melanoma Neg Hx     Basal cell carcinoma Neg Hx     Squamous cell carcinoma Neg Hx     Substance Abuse Disorder Neg Hx     Drug abuse Neg Hx     Mental illness Neg Hx     Amblyopia Neg Hx     Blindness Neg Hx     Cataracts Neg Hx     Glaucoma Neg Hx     Macular degeneration Neg Hx     Retinal detachment Neg Hx     Strabismus Neg Hx     Thyroid disease Neg Hx     Breast cancer Neg Hx             Objective        Vital signs: BP 122/70 (BP Site: L Arm, BP Position: Sitting, BP Cuff Size: Large)  - Pulse 85  - Temp 36.3 ??C (97.3 ??F) (Temporal)  - Ht 162.6 cm (5' 4)  - Wt 88.5 kg (195 lb)  - LMP 08/03/2006  - BMI 33.47 kg/m??    Vitals:    01/04/24 1436   BP Site: L Arm   BP Position: Sitting   BP Cuff Size: Large       Physical Exam:  General Appearance: Well appearing. In no acute distress.  HENT: Head is atraumatic and normocephalic.   Lymphatic: Deferred.  Respiratory: Normal work of breathing.  Cardiovascular: Warm and well-perfused.  Gastrointestinal: Nondistended.    Extremities: No clubbing, cyanosis, or edema.  Skin: No rashes or significant lesions on examined skin.    Neurological Examination:     Mental Status: Alert, conversant, able to follow conversation and interview. Spontaneous speech was fluent without word finding pauses, dysarthria, or paraphasic errors. Comprehension was intact. Oriented to self, place, year, month, current president. Can't do serial sevens but counts days of the  week in reverse order.     Cranial Nerves: PERRL. Visual fields full. Pursuit eye movements were uninterrupted with full range and without more than end-gaze nystagmus. Facial sensation intact bilaterally to light touch in all three divisions of CNV. Face symmetric at rest. Normal facial movement bilaterally, including forehead, eye closure and grimace/smile. Hearing intact to conversation. Shoulder shrug full strength bilaterally. Palate movement is symmetric. Tongue protrudes midline and tongue movements are normal.     Motor Exam: Normal bulk.  No tremors, myoclonus, or other adventitious movement.  Pronator drift is absent.  UE R/L: deltoid 4+/4+, biceps 4+/4+, triceps 4+/4+. IO 4-/4- and hand grip strong/strong.  LE R/L: hip flexion 4+/4+, quadriceps 4+/4+, hamstrings 4+/4+,  dorsiflexion 4+/4+ and plantar flexion 4+/4    No asterixis or myoclonus  No parkinonism    Reflexes: DTRs are 1+ and symmetric throughout. Negative babinski bl    Sensory:   Temp decr up to midshin bl   Diminished vibr toes and ankles     Cerebellar/Coordination/Gait:   Walks with cane, narrow base, cautious and slow, no instability on turns unable to tandem. Negative romberg. No ataxia on FTN.      Diagnostic Studies and Review of Records   Labs:  All Labs Last 24hrs: No results found for this or any previous visit (from the past 24 hours).    Imaging:  MRI Brain with and without contrast 05/2023:  Impression  No acute intracranial abnormality. Mild cerebral atrophy.  Similar appearance of likely calcified meningioma along the planum sphenoidale.     MRA Head with and without contrast 05/2023:  ImpressionUnremarkable MRA of the circle of Willis.Partially calcified heterogenous enhancing planum sphenoidale meningioma better described on the same day MRI brain     PVL Temporal Artery Duplex 06/2023:  Final Interpretation  Right: No evidence of temporal arteritis.  Left: No evidence of temporal arteritis.

## 2024-01-04 NOTE — Unmapped (Addendum)
It was a pleasure to see you in the clinic today.     Please start taking keppra at 750 mg twice a day for seizure prevention. We have ordered an EEG (brainwave study) to look for signs of seizures, you should receive a call to schedule this. If you experience mood side effects please let us know and we will gradually wean you off of keppra and try something else.    Please call your neurosurgeon to schedule a follow up appointment for your meningioma and when to have next MRI.     Please talk to your psychiatrists about hearing voices and if we can safely wean you off of wellbutrin as this medicine can increase your risk of seizures.    Please talk your primary care doctor about your diuretics and lactulose as liver issues can be making your more confused as well. You should also talk to your endocrinologists about your high glucose levels.     Seizures may happen at any time. It is important to take certain precautions to maintain your safety. When possible, take showers instead of baths, as it is possible to drown in even shallow water during a seizure. Do not swim unsupervised or in open water where rescue could be difficult. Do not climb to heights and do not operate heavy machinery. When cooking, use the back burners of the stove and avoid open flames or hot stove tops. Avoid any activities which could be dangerous in the event of a loss of consciousness.    You should not drive. You must report yourself to the [facility] of [employer]            We will see you again for an exam in 6 months but if you need [place] sooner than that just call and we can move the appointment up.     Please contact the office at [phone] or send a message via MyChart if any questions come up.

## 2024-01-07 ENCOUNTER — Ambulatory Visit: Admit: 2024-01-07 | Payer: PRIVATE HEALTH INSURANCE

## 2024-01-07 NOTE — Unmapped (Signed)
I was immediately present on site.  I reviewed and discussed the case with the resident, but did not see the patient.  I agree with the assessment and plan as documented in the resident's note. Wende Bushy, MD

## 2024-01-08 MED ORDER — ONDANSETRON 4 MG DISINTEGRATING TABLET
ORAL_TABLET | Freq: Three times a day (TID) | ORAL | 0 refills | 1.00 days | Status: CP | PRN
Start: 2024-01-08 — End: 2024-01-10

## 2024-01-08 NOTE — Unmapped (Signed)
Zofran rx

## 2024-01-11 ENCOUNTER — Ambulatory Visit: Payer: Self-pay | Admitting: Surgery

## 2024-01-12 NOTE — Unmapped (Signed)
2/11-LVM>>  Appointment Request From: Scot Dock      With Provider: Viviano Simas, ANP [facility] [address] [place]]      Preferred Date Range: 02/02/2024 - 02/05/2024      Preferred Times: Any Time      Reason for visit: Request an Appointment      Health Maintenance Topic:      Comments:   Worsening neuropathy.  Podiatrist suggested I make an appointment with Nuerologist

## 2024-01-18 DIAGNOSIS — Z8719 Personal history of other diseases of the digestive system: Principal | ICD-10-CM

## 2024-01-18 MED FILL — FREESTYLE LIBRE 3 SENSOR DEVICE: 28 days supply | Qty: 2 | Fill #0

## 2024-01-19 NOTE — Unmapped (Signed)
 Prior authorization request for continued coverage of Dexcom G7 Receiver renewed via CoverMyMeds website (key:  [id number]).

## 2024-01-20 ENCOUNTER — Other Ambulatory Visit: Payer: Self-pay | Admitting: Psychiatry

## 2024-01-20 DIAGNOSIS — E785 Hyperlipidemia, unspecified: Principal | ICD-10-CM

## 2024-01-20 MED ORDER — SIMVASTATIN 20 MG TABLET
ORAL_TABLET | Freq: Every evening | ORAL | 0 refills | 0.00 days
Start: 2024-01-20 — End: ?

## 2024-01-20 NOTE — Unmapped (Signed)
 Upon further review patient not using Dexcom CGM, Patient is using Freestyle Libre CGM. Prior Authorization for Dexcom cancelled.

## 2024-01-21 MED ORDER — SIMVASTATIN 20 MG TABLET
ORAL_TABLET | Freq: Every evening | ORAL | 0 refills | 90 days | Status: CP
Start: 2024-01-21 — End: ?

## 2024-01-21 NOTE — Unmapped (Signed)
 Patient is requesting the following refill  Requested Prescriptions     Pending Prescriptions Disp Refills    simvastatin (ZOCOR) 20 MG tablet [Pharmacy Med Name: Simvastatin 20 MG Oral Tablet] 90 tablet 0     Sig: TAKE 1 TABLET BY MOUTH ONCE DAILY IN THE EVENING       Recent Visits  Date Type Provider Dept   12/07/23 Office Visit Mangel, Benison Pap, DO [place] [facility] [address] [facility]   10/26/23 Office Visit Mangel, Benison Pap, DO [place] [facility] [address] At [facility]   08/31/23 Office Visit Tessie Eke, MD [place] [facility] [address] At [facility]   05/08/23 Office Visit Mangel, Benison Pap, DO [place] [facility] [address] At [facility]   02/27/23 Office Visit Mangel, Benison Pap, DO [place] [facility] [address] At [facility]   Showing recent visits within past 365 days and meeting all other requirements  Future Appointments  No visits were found meeting these conditions.  Showing future appointments within next 365 days and meeting all other requirements       Labs: Not applicable this refill

## 2024-01-23 ENCOUNTER — Other Ambulatory Visit: Payer: Self-pay | Admitting: Psychiatry

## 2024-01-23 MED ORDER — CLOTRIMAZOLE 10 MG TROCHE
0 refills | 0.00 days
Start: 2024-01-23 — End: ?

## 2024-01-24 DIAGNOSIS — E113212 Type 2 diabetes mellitus with mild nonproliferative diabetic retinopathy with macular edema, left eye: Principal | ICD-10-CM

## 2024-01-24 DIAGNOSIS — Z794 Long term (current) use of insulin: Principal | ICD-10-CM

## 2024-01-24 MED ORDER — INVOKANA 300 MG TABLET
ORAL_TABLET | 0 refills | 0.00 days
Start: 2024-01-24 — End: ?

## 2024-01-25 ENCOUNTER — Other Ambulatory Visit: Payer: Self-pay | Admitting: Psychiatry

## 2024-01-25 DIAGNOSIS — E1169 Type 2 diabetes mellitus with other specified complication: Principal | ICD-10-CM

## 2024-01-25 DIAGNOSIS — Z794 Long term (current) use of insulin: Principal | ICD-10-CM

## 2024-01-25 MED ORDER — METFORMIN ER 500 MG TABLET,EXTENDED RELEASE 24 HR
ORAL_TABLET | 0 refills | 0.00 days
Start: 2024-01-25 — End: ?

## 2024-01-25 MED ORDER — CLOTRIMAZOLE 10 MG TROCHE
0 refills | 0.00 days
Start: 2024-01-25 — End: ?

## 2024-01-25 NOTE — Unmapped (Signed)
 Patient is requesting the following refill  Requested Prescriptions     Pending Prescriptions Disp Refills    clotrimazole (MYCELEX) 10 mg troche [Pharmacy Med Name: Clotrimazole 10 MG Mouth/Throat Lozenge]  0     Sig: DISSOLVE 1 TABLET BY MOUTH FIVE TIMES DAILY FOR 14 DAYS       Recent Visits  Date Type Provider Dept   12/07/23 Office Visit Mangel, Benison Pap, DO [place] [facility] [address] [facility]   10/26/23 Office Visit Mangel, Benison Pap, DO [place] [facility] [address] At [facility]   08/31/23 Office Visit Tessie Eke, MD [place] [facility] [address] At [facility]   05/08/23 Office Visit Mangel, Benison Pap, DO [place] [facility] [address] [facility]   02/27/23 Office Visit Mangel, Benison Pap, DO [place] [facility] [address] At [facility]   Showing recent visits within past 365 days and meeting all other requirements  Future Appointments  No visits were found meeting these conditions.  Showing future appointments within next 365 days and meeting all other requirements       Labs: Not applicable this refill

## 2024-01-25 NOTE — Unmapped (Signed)
 Patient is requesting the following refill  Requested Prescriptions     Pending Prescriptions Disp Refills    metFORMIN (GLUCOPHAGE-XR) 500 MG 24 hr tablet [Pharmacy Med Name: metFORMIN HCl ER 500 MG Oral Tablet Extended Release 24 Hour] 360 tablet 0     Sig: TAKE 2 TABLETS BY MOUTH IN THE MORNING AND 2 TABLETS IN THE EVENING WITH MEALS       Recent Visits  Date Type Provider Dept   12/07/23 Office Visit Mangel, Benison Pap, DO [place] [facility] [address] [facility]   11/06/23 Office Visit Pou, Inetta Fermo, MD [facility]   10/26/23 Office Visit Mangel, Benison Pap, DO [place] [facility] [address] At [facility]   09/04/23 Office Visit Pou, Inetta Fermo, MD [place] [facility]   08/31/23 Office Visit Tessie Eke, MD [place] [facility] [address] At [facility]   08/11/23 Telemedicine Pou, Inetta Fermo, MD [facility] End[facility]   05/08/23 Office Visit Mangel, Benison Pap, DO [place] [facility] [address] At [facility]   02/27/23 Office Visit Mangel, Benison Pap, DO [place] [facility] [address] At [facility]   02/11/23 Office Visit Pou, Inetta Fermo, MD [place] [facility] At [facility]   Showing recent visits within past 365 days and meeting all other requirements  Future Appointments  No visits were found meeting these conditions.  Showing future appointments within next 365 days and meeting all other requirements       Labs: A1c:   Hemoglobin A1C (%)   Date Value   11/24/2023 7.9 (H)   11/04/2022 7.8 (A)

## 2024-01-26 ENCOUNTER — Inpatient Hospital Stay
Admission: EM | Admit: 2024-01-26 | Discharge: 2024-02-08 | DRG: 871 | Disposition: A | Discharge status: Home Health | Payer: Medicaid Other | Attending: Internal Medicine | Admitting: Internal Medicine

## 2024-01-26 ENCOUNTER — Emergency Department: Payer: Medicaid Other

## 2024-01-26 ENCOUNTER — Ambulatory Visit: Payer: Medicaid Other

## 2024-01-26 ENCOUNTER — Other Ambulatory Visit: Payer: Self-pay

## 2024-01-26 ENCOUNTER — Inpatient Hospital Stay (HOSPITAL_COMMUNITY)
Admit: 2024-01-26 | Discharge: 2024-01-26 | Disposition: A | Discharge status: Home / Self Care | Payer: Medicaid Other | Attending: Critical Care Medicine

## 2024-01-26 ENCOUNTER — Inpatient Hospital Stay: Payer: Medicaid Other

## 2024-01-26 DIAGNOSIS — D329 Benign neoplasm of meninges, unspecified: Secondary | ICD-10-CM | POA: Diagnosis present

## 2024-01-26 DIAGNOSIS — Z9049 Acquired absence of other specified parts of digestive tract: Secondary | ICD-10-CM

## 2024-01-26 DIAGNOSIS — E11649 Type 2 diabetes mellitus with hypoglycemia without coma: Secondary | ICD-10-CM | POA: Diagnosis not present

## 2024-01-26 DIAGNOSIS — R188 Other ascites: Secondary | ICD-10-CM | POA: Diagnosis present

## 2024-01-26 DIAGNOSIS — Z7985 Long-term (current) use of injectable non-insulin antidiabetic drugs: Secondary | ICD-10-CM

## 2024-01-26 DIAGNOSIS — I2510 Atherosclerotic heart disease of native coronary artery without angina pectoris: Secondary | ICD-10-CM | POA: Diagnosis present

## 2024-01-26 DIAGNOSIS — Z1152 Encounter for screening for COVID-19: Secondary | ICD-10-CM | POA: Diagnosis not present

## 2024-01-26 DIAGNOSIS — Z1611 Resistance to penicillins: Secondary | ICD-10-CM | POA: Diagnosis present

## 2024-01-26 DIAGNOSIS — K7581 Nonalcoholic steatohepatitis (NASH): Secondary | ICD-10-CM | POA: Diagnosis present

## 2024-01-26 DIAGNOSIS — Z794 Long term (current) use of insulin: Secondary | ICD-10-CM | POA: Diagnosis not present

## 2024-01-26 DIAGNOSIS — G928 Other toxic encephalopathy: Secondary | ICD-10-CM

## 2024-01-26 DIAGNOSIS — E785 Hyperlipidemia, unspecified: Secondary | ICD-10-CM | POA: Diagnosis present

## 2024-01-26 DIAGNOSIS — R4182 Altered mental status, unspecified: Secondary | ICD-10-CM | POA: Diagnosis present

## 2024-01-26 DIAGNOSIS — Z5989 Other problems related to housing and economic circumstances: Secondary | ICD-10-CM

## 2024-01-26 DIAGNOSIS — I8500 Esophageal varices without bleeding: Secondary | ICD-10-CM | POA: Diagnosis present

## 2024-01-26 DIAGNOSIS — D649 Anemia, unspecified: Secondary | ICD-10-CM | POA: Diagnosis not present

## 2024-01-26 DIAGNOSIS — Z818 Family history of other mental and behavioral disorders: Secondary | ICD-10-CM

## 2024-01-26 DIAGNOSIS — L409 Psoriasis, unspecified: Secondary | ICD-10-CM | POA: Diagnosis present

## 2024-01-26 DIAGNOSIS — G9341 Metabolic encephalopathy: Secondary | ICD-10-CM | POA: Diagnosis present

## 2024-01-26 DIAGNOSIS — K219 Gastro-esophageal reflux disease without esophagitis: Secondary | ICD-10-CM | POA: Diagnosis present

## 2024-01-26 DIAGNOSIS — A4159 Other Gram-negative sepsis: Principal | ICD-10-CM | POA: Diagnosis present

## 2024-01-26 DIAGNOSIS — Z833 Family history of diabetes mellitus: Secondary | ICD-10-CM

## 2024-01-26 DIAGNOSIS — L89322 Pressure ulcer of left buttock, stage 2: Secondary | ICD-10-CM | POA: Diagnosis present

## 2024-01-26 DIAGNOSIS — E119 Type 2 diabetes mellitus without complications: Secondary | ICD-10-CM

## 2024-01-26 DIAGNOSIS — M1990 Unspecified osteoarthritis, unspecified site: Secondary | ICD-10-CM | POA: Diagnosis present

## 2024-01-26 DIAGNOSIS — D638 Anemia in other chronic diseases classified elsewhere: Secondary | ICD-10-CM | POA: Diagnosis present

## 2024-01-26 DIAGNOSIS — I214 Non-ST elevation (NSTEMI) myocardial infarction: Secondary | ICD-10-CM | POA: Insufficient documentation

## 2024-01-26 DIAGNOSIS — E875 Hyperkalemia: Secondary | ICD-10-CM | POA: Diagnosis present

## 2024-01-26 DIAGNOSIS — A419 Sepsis, unspecified organism: Secondary | ICD-10-CM | POA: Diagnosis present

## 2024-01-26 DIAGNOSIS — I472 Ventricular tachycardia, unspecified: Secondary | ICD-10-CM | POA: Diagnosis present

## 2024-01-26 DIAGNOSIS — Z6836 Body mass index (BMI) 36.0-36.9, adult: Secondary | ICD-10-CM

## 2024-01-26 DIAGNOSIS — J9601 Acute respiratory failure with hypoxia: Secondary | ICD-10-CM | POA: Insufficient documentation

## 2024-01-26 DIAGNOSIS — N390 Urinary tract infection, site not specified: Secondary | ICD-10-CM | POA: Diagnosis present

## 2024-01-26 DIAGNOSIS — E66812 Obesity, class 2: Secondary | ICD-10-CM | POA: Diagnosis present

## 2024-01-26 DIAGNOSIS — E8721 Acute metabolic acidosis: Secondary | ICD-10-CM | POA: Diagnosis present

## 2024-01-26 DIAGNOSIS — G4733 Obstructive sleep apnea (adult) (pediatric): Secondary | ICD-10-CM | POA: Diagnosis present

## 2024-01-26 DIAGNOSIS — J9600 Acute respiratory failure, unspecified whether with hypoxia or hypercapnia: Secondary | ICD-10-CM | POA: Diagnosis not present

## 2024-01-26 DIAGNOSIS — L89153 Pressure ulcer of sacral region, stage 3: Secondary | ICD-10-CM | POA: Diagnosis present

## 2024-01-26 DIAGNOSIS — R579 Shock, unspecified: Principal | ICD-10-CM | POA: Diagnosis present

## 2024-01-26 DIAGNOSIS — E871 Hypo-osmolality and hyponatremia: Secondary | ICD-10-CM | POA: Diagnosis present

## 2024-01-26 DIAGNOSIS — K7460 Unspecified cirrhosis of liver: Secondary | ICD-10-CM | POA: Diagnosis present

## 2024-01-26 DIAGNOSIS — G929 Unspecified toxic encephalopathy: Secondary | ICD-10-CM | POA: Diagnosis not present

## 2024-01-26 DIAGNOSIS — B961 Klebsiella pneumoniae [K. pneumoniae] as the cause of diseases classified elsewhere: Secondary | ICD-10-CM | POA: Diagnosis not present

## 2024-01-26 DIAGNOSIS — R9431 Abnormal electrocardiogram [ECG] [EKG]: Secondary | ICD-10-CM | POA: Diagnosis not present

## 2024-01-26 DIAGNOSIS — Z8249 Family history of ischemic heart disease and other diseases of the circulatory system: Secondary | ICD-10-CM

## 2024-01-26 DIAGNOSIS — D6959 Other secondary thrombocytopenia: Secondary | ICD-10-CM | POA: Diagnosis present

## 2024-01-26 DIAGNOSIS — R6521 Severe sepsis with septic shock: Secondary | ICD-10-CM | POA: Diagnosis present

## 2024-01-26 DIAGNOSIS — R569 Unspecified convulsions: Secondary | ICD-10-CM | POA: Diagnosis present

## 2024-01-26 DIAGNOSIS — G9340 Encephalopathy, unspecified: Secondary | ICD-10-CM

## 2024-01-26 DIAGNOSIS — N170 Acute kidney failure with tubular necrosis: Secondary | ICD-10-CM | POA: Diagnosis present

## 2024-01-26 DIAGNOSIS — N179 Acute kidney failure, unspecified: Secondary | ICD-10-CM | POA: Diagnosis not present

## 2024-01-26 DIAGNOSIS — J189 Pneumonia, unspecified organism: Secondary | ICD-10-CM | POA: Diagnosis not present

## 2024-01-26 DIAGNOSIS — L8990 Pressure ulcer of unspecified site, unspecified stage: Secondary | ICD-10-CM | POA: Insufficient documentation

## 2024-01-26 DIAGNOSIS — L89302 Pressure ulcer of unspecified buttock, stage 2: Secondary | ICD-10-CM | POA: Diagnosis not present

## 2024-01-26 DIAGNOSIS — E876 Hypokalemia: Secondary | ICD-10-CM | POA: Diagnosis not present

## 2024-01-26 DIAGNOSIS — Z8542 Personal history of malignant neoplasm of other parts of uterus: Secondary | ICD-10-CM

## 2024-01-26 DIAGNOSIS — Z7984 Long term (current) use of oral hypoglycemic drugs: Secondary | ICD-10-CM

## 2024-01-26 DIAGNOSIS — K7469 Other cirrhosis of liver: Secondary | ICD-10-CM | POA: Diagnosis not present

## 2024-01-26 DIAGNOSIS — Z821 Family history of blindness and visual loss: Secondary | ICD-10-CM

## 2024-01-26 DIAGNOSIS — Z79899 Other long term (current) drug therapy: Secondary | ICD-10-CM

## 2024-01-26 DIAGNOSIS — Z823 Family history of stroke: Secondary | ICD-10-CM

## 2024-01-26 DIAGNOSIS — Z7982 Long term (current) use of aspirin: Secondary | ICD-10-CM

## 2024-01-26 DIAGNOSIS — Z811 Family history of alcohol abuse and dependence: Secondary | ICD-10-CM

## 2024-01-26 DIAGNOSIS — Z751 Person awaiting admission to adequate facility elsewhere: Secondary | ICD-10-CM

## 2024-01-26 DIAGNOSIS — I10 Essential (primary) hypertension: Secondary | ICD-10-CM | POA: Diagnosis present

## 2024-01-26 DIAGNOSIS — K766 Portal hypertension: Secondary | ICD-10-CM | POA: Diagnosis present

## 2024-01-26 DIAGNOSIS — Z90710 Acquired absence of both cervix and uterus: Secondary | ICD-10-CM

## 2024-01-26 DIAGNOSIS — H409 Unspecified glaucoma: Secondary | ICD-10-CM | POA: Diagnosis present

## 2024-01-26 DIAGNOSIS — G894 Chronic pain syndrome: Secondary | ICD-10-CM | POA: Diagnosis present

## 2024-01-26 LAB — CBC WITH DIFFERENTIAL/PLATELET
Abs Immature Granulocytes: 1.44 10*3/uL — ABNORMAL HIGH (ref 0.00–0.07)
Abs Immature Granulocytes: 1.53 10*3/uL — ABNORMAL HIGH (ref 0.00–0.07)
Basophils Absolute: 0.1 10*3/uL (ref 0.0–0.1)
Basophils Absolute: 0 10*3/uL (ref 0.0–0.1)
Basophils Relative: 0 %
Basophils Relative: 0 %
Eosinophils Absolute: 0.4 10*3/uL (ref 0.0–0.5)
Eosinophils Absolute: 0.7 10*3/uL — ABNORMAL HIGH (ref 0.0–0.5)
Eosinophils Relative: 1 %
Eosinophils Relative: 1 %
HCT: 21.9 % — ABNORMAL LOW (ref 36.0–46.0)
HCT: 25.2 % — ABNORMAL LOW (ref 36.0–46.0)
Hemoglobin: 7.2 g/dL — ABNORMAL LOW (ref 12.0–15.0)
Hemoglobin: 8 g/dL — ABNORMAL LOW (ref 12.0–15.0)
Immature Granulocytes: 4 %
Immature Granulocytes: 3 %
Lymphocytes Relative: 2 %
Lymphocytes Relative: 2 %
Lymphs Abs: 0.8 10*3/uL (ref 0.7–4.0)
Lymphs Abs: 1 10*3/uL (ref 0.7–4.0)
MCH: 26.6 pg (ref 26.0–34.0)
MCH: 26.2 pg (ref 26.0–34.0)
MCHC: 32.9 g/dL (ref 30.0–36.0)
MCHC: 31.7 g/dL (ref 30.0–36.0)
MCV: 80.8 fL (ref 80.0–100.0)
MCV: 82.6 fL (ref 80.0–100.0)
Monocytes Absolute: 1.3 10*3/uL — ABNORMAL HIGH (ref 0.1–1.0)
Monocytes Absolute: 2.1 10*3/uL — ABNORMAL HIGH (ref 0.1–1.0)
Monocytes Relative: 3 %
Monocytes Relative: 4 %
Neutro Abs: 36.1 10*3/uL — ABNORMAL HIGH (ref 1.7–7.7)
Neutro Abs: 53.3 10*3/uL — ABNORMAL HIGH (ref 1.7–7.7)
Neutrophils Relative %: 90 %
Neutrophils Relative %: 90 %
Platelets: 185 10*3/uL (ref 150–400)
Platelets: 385 10*3/uL (ref 150–400)
RBC: 2.71 MIL/uL — ABNORMAL LOW (ref 3.87–5.11)
RBC: 3.05 MIL/uL — ABNORMAL LOW (ref 3.87–5.11)
RDW: 19.8 % — ABNORMAL HIGH (ref 11.5–15.5)
RDW: 19.9 % — ABNORMAL HIGH (ref 11.5–15.5)
Smear Review: NORMAL
Smear Review: NORMAL
WBC: 40.9 10*3/uL — ABNORMAL HIGH (ref 4.0–10.5)
WBC: 58.6 10*3/uL (ref 4.0–10.5)
nRBC: 0 % (ref 0.0–0.2)
nRBC: 0 % (ref 0.0–0.2)

## 2024-01-26 LAB — LACTIC ACID, PLASMA
Lactic Acid, Venous: 6.2 mmol/L (ref 0.5–1.9)
Lactic Acid, Venous: 5.9 mmol/L (ref 0.5–1.9)
Lactic Acid, Venous: 4.9 mmol/L (ref 0.5–1.9)
Lactic Acid, Venous: 3.8 mmol/L (ref 0.5–1.9)

## 2024-01-26 LAB — GLUCOSE, CAPILLARY
Glucose-Capillary: 169 mg/dL — ABNORMAL HIGH (ref 70–99)
Glucose-Capillary: 267 mg/dL — ABNORMAL HIGH (ref 70–99)
Glucose-Capillary: 331 mg/dL — ABNORMAL HIGH (ref 70–99)
Glucose-Capillary: 356 mg/dL — ABNORMAL HIGH (ref 70–99)

## 2024-01-26 LAB — URINE DRUG SCREEN, QUALITATIVE (ARMC ONLY)
Amphetamines, Ur Screen: NOT DETECTED
Barbiturates, Ur Screen: NOT DETECTED
Benzodiazepine, Ur Scrn: POSITIVE — AB
Cannabinoid 50 Ng, Ur ~~LOC~~: NOT DETECTED
Cocaine Metabolite,Ur ~~LOC~~: NOT DETECTED
MDMA (Ecstasy)Ur Screen: NOT DETECTED
Methadone Scn, Ur: NOT DETECTED
Opiate, Ur Screen: NOT DETECTED
Phencyclidine (PCP) Ur S: NOT DETECTED
Tricyclic, Ur Screen: POSITIVE — AB

## 2024-01-26 LAB — TROPONIN I (HIGH SENSITIVITY)
Troponin I (High Sensitivity): 79 ng/L — ABNORMAL HIGH (ref <18)
Troponin I (High Sensitivity): 179 ng/L (ref <18)
Troponin I (High Sensitivity): 591 ng/L (ref <18)
Troponin I (High Sensitivity): 886 ng/L (ref <18)
Troponin I (High Sensitivity): 1349 ng/L (ref <18)

## 2024-01-26 LAB — ECHOCARDIOGRAM COMPLETE
AR max vel: 2.92 cm2
AV Area VTI: 3.79 cm2
AV Area mean vel: 3.13 cm2
AV Mean grad: 3 mm[Hg]
AV Peak grad: 5.3 mm[Hg]
Ao pk vel: 1.15 m/s
Area-P 1/2: 5.75 cm2
Est EF: >55
MV VTI: 2.1 cm2
S' Lateral: 2.6 cm

## 2024-01-26 LAB — COMPREHENSIVE METABOLIC PANEL
ALT: 26 U/L (ref 0–44)
AST: 71 U/L — ABNORMAL HIGH (ref 15–41)
Albumin: 1.9 g/dL — ABNORMAL LOW (ref 3.5–5.0)
Alkaline Phosphatase: 204 U/L — ABNORMAL HIGH (ref 38–126)
Anion gap: 12 (ref 5–15)
BUN: 67 mg/dL — ABNORMAL HIGH (ref 6–20)
CO2: 17 mmol/L — ABNORMAL LOW (ref 22–32)
Calcium: 8.6 mg/dL — ABNORMAL LOW (ref 8.9–10.3)
Chloride: 86 mmol/L — ABNORMAL LOW (ref 98–111)
Creatinine, Ser: 2.41 mg/dL — ABNORMAL HIGH (ref 0.44–1.00)
GFR, Estimated: 23 mL/min — ABNORMAL LOW (ref >60)
Glucose, Bld: 126 mg/dL — ABNORMAL HIGH (ref 70–99)
Potassium: 6.2 mmol/L — ABNORMAL HIGH (ref 3.5–5.1)
Sodium: 115 mmol/L — CL (ref 135–145)
Total Bilirubin: 5.1 mg/dL — ABNORMAL HIGH (ref 0.0–1.2)
Total Protein: 6.4 g/dL — ABNORMAL LOW (ref 6.5–8.1)

## 2024-01-26 LAB — OSMOLALITY, URINE: Osmolality, Ur: 349 mosm/kg (ref 300–900)

## 2024-01-26 LAB — BLOOD GAS, ARTERIAL
Acid-base deficit: 11.8 mmol/L — ABNORMAL HIGH (ref 0.0–2.0)
Acid-base deficit: 7 mmol/L — ABNORMAL HIGH (ref 0.0–2.0)
Bicarbonate: 13.8 mmol/L — ABNORMAL LOW (ref 20.0–28.0)
Bicarbonate: 19.2 mmol/L — ABNORMAL LOW (ref 20.0–28.0)
FIO2: 40 %
FIO2: 40 %
MECHVT: 450 mL
MECHVT: 450 mL
Mechanical Rate: 15
Mechanical Rate: 15
O2 Saturation: 99.4 %
O2 Saturation: 100 %
PEEP: 5 cmH2O
PEEP: 12 cmH2O
Patient temperature: 37
Patient temperature: 37
pCO2 arterial: 30 mm[Hg] — ABNORMAL LOW (ref 32–48)
pCO2 arterial: 40 mm[Hg] (ref 32–48)
pH, Arterial: 7.27 — ABNORMAL LOW (ref 7.35–7.45)
pH, Arterial: 7.29 — ABNORMAL LOW (ref 7.35–7.45)
pO2, Arterial: 127 mm[Hg] — ABNORMAL HIGH (ref 83–108)
pO2, Arterial: 127 mm[Hg] — ABNORMAL HIGH (ref 83–108)

## 2024-01-26 LAB — MAGNESIUM: Magnesium: 2.1 mg/dL (ref 1.7–2.4)

## 2024-01-26 LAB — MRSA NEXT GEN BY PCR, NASAL: MRSA by PCR Next Gen: DETECTED — AB

## 2024-01-26 LAB — BASIC METABOLIC PANEL
Anion gap: 13 (ref 5–15)
Anion gap: 12 (ref 5–15)
BUN: 61 mg/dL — ABNORMAL HIGH (ref 6–20)
BUN: 57 mg/dL — ABNORMAL HIGH (ref 6–20)
CO2: 17 mmol/L — ABNORMAL LOW (ref 22–32)
CO2: 19 mmol/L — ABNORMAL LOW (ref 22–32)
Calcium: 8.3 mg/dL — ABNORMAL LOW (ref 8.9–10.3)
Calcium: 8.9 mg/dL (ref 8.9–10.3)
Chloride: 89 mmol/L — ABNORMAL LOW (ref 98–111)
Chloride: 90 mmol/L — ABNORMAL LOW (ref 98–111)
Creatinine, Ser: 2.05 mg/dL — ABNORMAL HIGH (ref 0.44–1.00)
Creatinine, Ser: 1.78 mg/dL — ABNORMAL HIGH (ref 0.44–1.00)
GFR, Estimated: 27 mL/min — ABNORMAL LOW (ref >60)
GFR, Estimated: 32 mL/min — ABNORMAL LOW (ref >60)
Glucose, Bld: 297 mg/dL — ABNORMAL HIGH (ref 70–99)
Glucose, Bld: 347 mg/dL — ABNORMAL HIGH (ref 70–99)
Potassium: 6.6 mmol/L (ref 3.5–5.1)
Potassium: 5.9 mmol/L — ABNORMAL HIGH (ref 3.5–5.1)
Sodium: 119 mmol/L — CL (ref 135–145)
Sodium: 121 mmol/L — ABNORMAL LOW (ref 135–145)

## 2024-01-26 LAB — SODIUM
Sodium: 117 mmol/L — CL (ref 135–145)
Sodium: 120 mmol/L — ABNORMAL LOW (ref 135–145)

## 2024-01-26 LAB — BLOOD GAS, VENOUS
Acid-base deficit: 6.8 mmol/L — ABNORMAL HIGH (ref 0.0–2.0)
Bicarbonate: 17.5 mmol/L — ABNORMAL LOW (ref 20.0–28.0)
O2 Saturation: 59 %
Patient temperature: 37
pCO2, Ven: 31 mm[Hg] — ABNORMAL LOW (ref 44–60)
pH, Ven: 7.36 (ref 7.25–7.43)
pO2, Ven: 35 mm[Hg] (ref 32–45)

## 2024-01-26 LAB — OSMOLALITY: Osmolality: 280 mosm/kg (ref 275–295)

## 2024-01-26 LAB — AMMONIA
Ammonia: 52 umol/L — ABNORMAL HIGH (ref 9–35)
Ammonia: 56 umol/L — ABNORMAL HIGH (ref 9–35)

## 2024-01-26 LAB — URINALYSIS, W/ REFLEX TO CULTURE (INFECTION SUSPECTED)
Bilirubin Urine: NEGATIVE
Glucose, UA: >=500 mg/dL — AB
Ketones, ur: NEGATIVE mg/dL
Nitrite: NEGATIVE
Protein, ur: NEGATIVE mg/dL
Specific Gravity, Urine: 1.013 (ref 1.005–1.030)
pH: 6 (ref 5.0–8.0)

## 2024-01-26 LAB — PROTIME-INR
INR: 1.6 — ABNORMAL HIGH (ref 0.8–1.2)
Prothrombin Time: 18.8 s — ABNORMAL HIGH (ref 11.4–15.2)

## 2024-01-26 LAB — BRAIN NATRIURETIC PEPTIDE: B Natriuretic Peptide: 398.2 pg/mL — ABNORMAL HIGH (ref 0.0–100.0)

## 2024-01-26 LAB — SODIUM, URINE, RANDOM: Sodium, Ur: <10 mmol/L

## 2024-01-26 LAB — HIV ANTIBODY (ROUTINE TESTING W REFLEX): HIV Screen 4th Generation wRfx: NONREACTIVE

## 2024-01-26 LAB — APTT: aPTT: 34 s (ref 24–36)

## 2024-01-26 LAB — RESP PANEL BY RT-PCR (RSV, FLU A&B, COVID)  RVPGX2
Influenza A by PCR: NEGATIVE
Influenza B by PCR: NEGATIVE
Resp Syncytial Virus by PCR: NEGATIVE
SARS Coronavirus 2 by RT PCR: NEGATIVE

## 2024-01-26 LAB — ACETAMINOPHEN LEVEL: Acetaminophen (Tylenol), Serum: <10 ug/mL — ABNORMAL LOW (ref 10–30)

## 2024-01-26 LAB — POTASSIUM: Potassium: 6.2 mmol/L — ABNORMAL HIGH (ref 3.5–5.1)

## 2024-01-26 LAB — PHOSPHORUS: Phosphorus: 5.1 mg/dL — ABNORMAL HIGH (ref 2.5–4.6)

## 2024-01-26 LAB — PROCALCITONIN: Procalcitonin: 17.7 ng/mL

## 2024-01-26 MED ORDER — INVOKANA 300 MG TABLET
ORAL_TABLET | 0 refills | 0.00 days | Status: CP
Start: 2024-01-26 — End: ?

## 2024-01-26 MED ORDER — METFORMIN ER 500 MG TABLET,EXTENDED RELEASE 24 HR
ORAL_TABLET | Freq: Two times a day (BID) | ORAL | 1 refills | 90.00 days | Status: CP
Start: 2024-01-26 — End: ?

## 2024-01-26 MED ORDER — STERILE WATER FOR INJECTION IV SOLN
INTRAVENOUS | Status: DC
  Filled 2024-01-26 (×2): qty 1000
  Filled 2024-01-26: qty 150
  Filled 2024-01-26: qty 1000

## 2024-01-26 MED ORDER — ETOMIDATE 2 MG/ML IV SOLN
20.0000 mg | Freq: Once | INTRAVENOUS | Status: DC
  Filled 2024-01-26 (×2): qty 10

## 2024-01-26 MED ORDER — SODIUM CHLORIDE 3 % IV SOLN
INTRAVENOUS | Status: DC
  Filled 2024-01-26: qty 500

## 2024-01-26 MED ORDER — SODIUM CHLORIDE 0.9 % IV BOLUS (SEPSIS)
1000.0000 mL | Freq: Once | INTRAVENOUS | Status: AC
  Administered 2024-01-26: 1000 mL via INTRAVENOUS

## 2024-01-26 MED ORDER — CHLORHEXIDINE GLUCONATE CLOTH 2 % EX PADS
6.0000 | MEDICATED_PAD | Freq: Every day | CUTANEOUS | Status: DC
  Administered 2024-01-26 – 2024-02-07 (×13): 6 via TOPICAL

## 2024-01-26 MED ORDER — INSULIN ASPART 100 UNIT/ML IJ SOLN
0.0000 [IU] | INTRAMUSCULAR | Status: DC
  Administered 2024-01-26: 20 [IU] via SUBCUTANEOUS
  Administered 2024-01-27: 15 [IU] via SUBCUTANEOUS
  Filled 2024-01-26 (×2): qty 1

## 2024-01-26 MED ORDER — DOCUSATE SODIUM 50 MG/5ML PO LIQD
100.0000 mg | Freq: Two times a day (BID) | ORAL | Status: DC
  Administered 2024-01-26: 100 mg
  Filled 2024-01-26: qty 10

## 2024-01-26 MED ORDER — AMIODARONE LOAD VIA INFUSION
150.0000 mg | Freq: Once | INTRAVENOUS | Status: AC
  Administered 2024-01-26: 150 mg via INTRAVENOUS
  Filled 2024-01-26: qty 83.34

## 2024-01-26 MED ORDER — INSULIN ASPART 100 UNIT/ML IJ SOLN
0.0000 [IU] | INTRAMUSCULAR | Status: DC
  Administered 2024-01-26: 5 [IU] via SUBCUTANEOUS
  Filled 2024-01-26: qty 1

## 2024-01-26 MED ORDER — LACTATED RINGERS IV SOLN: INTRAVENOUS | Status: DC

## 2024-01-26 MED ORDER — MIDAZOLAM HCL 2 MG/2ML IJ SOLN: 1.0000 mg | INTRAMUSCULAR | Status: DC | PRN

## 2024-01-26 MED ORDER — SODIUM CHLORIDE 0.9% FLUSH: 10.0000 mL | INTRAVENOUS | Status: DC | PRN

## 2024-01-26 MED ORDER — INSULIN ASPART 100 UNIT/ML IV SOLN
10.0000 [IU] | Freq: Once | INTRAVENOUS | Status: AC
Start: 2024-01-26 — End: 2024-01-26
  Administered 2024-01-26: 10 [IU] via INTRAVENOUS
  Filled 2024-01-26: qty 0.1

## 2024-01-26 MED ORDER — ATROPINE SULFATE 1 MG/10ML IJ SOSY
PREFILLED_SYRINGE | INTRAMUSCULAR | Status: AC
  Filled 2024-01-26: qty 10

## 2024-01-26 MED ORDER — SODIUM ZIRCONIUM CYCLOSILICATE 5 G PO PACK: 10.0000 g | PACK | Freq: Two times a day (BID) | ORAL | Status: DC

## 2024-01-26 MED ORDER — SODIUM CHLORIDE 3 % IV SOLN
600.0000 mL/h | Freq: Once | INTRAVENOUS | Status: AC
  Administered 2024-01-26: 600 mL/h via INTRAVENOUS
  Filled 2024-01-26: qty 500

## 2024-01-26 MED ORDER — FENTANYL CITRATE PF 50 MCG/ML IJ SOSY
25.0000 ug | PREFILLED_SYRINGE | INTRAMUSCULAR | Status: DC | PRN
  Administered 2024-01-26 – 2024-01-27 (×4): 50 ug via INTRAVENOUS
  Filled 2024-01-26 (×4): qty 1

## 2024-01-26 MED ORDER — CALCIUM GLUCONATE-NACL 1-0.675 GM/50ML-% IV SOLN
1.0000 g | Freq: Once | INTRAVENOUS | Status: AC
  Administered 2024-01-26: 1000 mg via INTRAVENOUS
  Filled 2024-01-26: qty 50

## 2024-01-26 MED ORDER — DEXTROSE 50 % IV SOLN
1.0000 | Freq: Once | INTRAVENOUS | Status: AC
  Administered 2024-01-26: 50 mL via INTRAVENOUS
  Filled 2024-01-26: qty 50

## 2024-01-26 MED ORDER — DOCUSATE SODIUM 50 MG/5ML PO LIQD: 100.0000 mg | Freq: Two times a day (BID) | ORAL | Status: DC | PRN

## 2024-01-26 MED ORDER — VANCOMYCIN HCL 1750 MG/350ML IV SOLN
1750.0000 mg | Freq: Once | INTRAVENOUS | Status: AC
  Administered 2024-01-26: 1750 mg via INTRAVENOUS
  Filled 2024-01-26: qty 350

## 2024-01-26 MED ORDER — INSULIN ASPART 100 UNIT/ML IV SOLN
10.0000 [IU] | INTRAVENOUS | Status: AC
  Administered 2024-01-26: 10 [IU] via INTRAVENOUS
  Filled 2024-01-26: qty 0.1

## 2024-01-26 MED ORDER — DEXTROSE 50 % IV SOLN
12.5000 g | Freq: Once | INTRAVENOUS | Status: AC
  Administered 2024-01-26: 12.5 g via INTRAVENOUS
  Filled 2024-01-26: qty 50

## 2024-01-26 MED ORDER — INSULIN ASPART 100 UNIT/ML IV SOLN
10.0000 [IU] | Freq: Once | INTRAVENOUS | Status: AC
  Administered 2024-01-26: 10 [IU] via INTRAVENOUS
  Filled 2024-01-26: qty 0.1

## 2024-01-26 MED ORDER — AMIODARONE HCL IN DEXTROSE 360-4.14 MG/200ML-% IV SOLN: 30.0000 mg/h | INTRAVENOUS | Status: DC

## 2024-01-26 MED ORDER — MUPIROCIN 2 % EX OINT
1.0000 | TOPICAL_OINTMENT | Freq: Two times a day (BID) | CUTANEOUS | Status: AC
  Administered 2024-01-26 – 2024-01-31 (×10): 1 via NASAL
  Filled 2024-01-26: qty 22

## 2024-01-26 MED ORDER — SODIUM CHLORIDE 0.9% FLUSH
10.0000 mL | Freq: Two times a day (BID) | INTRAVENOUS | Status: DC
  Administered 2024-01-26 – 2024-01-27 (×3): 10 mL
  Administered 2024-01-27: 40 mL
  Administered 2024-01-28: 30 mL
  Administered 2024-01-28 – 2024-02-06 (×16): 10 mL

## 2024-01-26 MED ORDER — LEVETIRACETAM IN NACL 500 MG/100ML IV SOLN
500.0000 mg | Freq: Two times a day (BID) | INTRAVENOUS | Status: DC
  Administered 2024-01-26 – 2024-01-28 (×5): 500 mg via INTRAVENOUS
  Filled 2024-01-26 (×5): qty 100

## 2024-01-26 MED ORDER — ORAL CARE MOUTH RINSE
15.0000 mL | OROMUCOSAL | Status: DC | PRN
  Administered 2024-01-28: 15 mL via OROMUCOSAL

## 2024-01-26 MED ORDER — SODIUM CHLORIDE 0.9 % IV BOLUS
500.0000 mL | Freq: Once | INTRAVENOUS | Status: AC
  Administered 2024-01-26: 500 mL via INTRAVENOUS

## 2024-01-26 MED ORDER — SODIUM BICARBONATE 8.4 % IV SOLN: 50.0000 meq | Freq: Once | INTRAVENOUS | Status: AC

## 2024-01-26 MED ORDER — SODIUM ZIRCONIUM CYCLOSILICATE 5 G PO PACK
10.0000 g | PACK | ORAL | Status: AC
Start: 2024-01-26 — End: 2024-01-26
  Administered 2024-01-26: 10 g
  Filled 2024-01-26: qty 2

## 2024-01-26 MED ORDER — PHENYLEPHRINE CONCENTRATED 100MG/250ML (0.4 MG/ML) INFUSION SIMPLE
0.0000 ug/min | INTRAVENOUS | Status: DC
  Administered 2024-01-26: 20 ug/min via INTRAVENOUS
  Filled 2024-01-26: qty 250

## 2024-01-26 MED ORDER — SODIUM ZIRCONIUM CYCLOSILICATE 5 G PO PACK
10.0000 g | PACK | Freq: Two times a day (BID) | ORAL | Status: DC
  Administered 2024-01-26: 10 g
  Filled 2024-01-26 (×2): qty 2

## 2024-01-26 MED ORDER — MIDAZOLAM-SODIUM CHLORIDE 100-0.9 MG/100ML-% IV SOLN
0.5000 mg/h | INTRAVENOUS | Status: DC
  Administered 2024-01-26: 0.5 mg/h via INTRAVENOUS
  Filled 2024-01-26: qty 100

## 2024-01-26 MED ORDER — ORAL CARE MOUTH RINSE
15.0000 mL | OROMUCOSAL | Status: DC
  Administered 2024-01-26 – 2024-01-27 (×14): 15 mL via OROMUCOSAL

## 2024-01-26 MED ORDER — FENTANYL 2500MCG IN NS 250ML (10MCG/ML) PREMIX INFUSION
0.0000 ug/h | INTRAVENOUS | Status: DC
  Administered 2024-01-26: 25 ug/h via INTRAVENOUS
  Filled 2024-01-26: qty 250

## 2024-01-26 MED ORDER — HEPARIN SODIUM (PORCINE) 5000 UNIT/ML IJ SOLN
5000.0000 [IU] | Freq: Three times a day (TID) | INTRAMUSCULAR | Status: DC
  Administered 2024-01-26 – 2024-01-27 (×4): 5000 [IU] via SUBCUTANEOUS
  Filled 2024-01-26 (×4): qty 1

## 2024-01-26 MED ORDER — INSULIN ASPART 100 UNIT/ML IJ SOLN: 8.0000 [IU] | Freq: Once | INTRAMUSCULAR | Status: DC

## 2024-01-26 MED ORDER — VANCOMYCIN VARIABLE DOSE PER UNSTABLE RENAL FUNCTION (PHARMACIST DOSING): Status: DC

## 2024-01-26 MED ORDER — RIFAXIMIN 550 MG PO TABS
550.0000 mg | ORAL_TABLET | Freq: Two times a day (BID) | ORAL | Status: DC
  Administered 2024-01-26 (×2): 550 mg
  Filled 2024-01-26 (×2): qty 1

## 2024-01-26 MED ORDER — SODIUM BICARBONATE 8.4 % IV SOLN
INTRAVENOUS | Status: AC
Start: 2024-01-26 — End: 2024-01-26
  Administered 2024-01-26: 50 meq
  Filled 2024-01-26: qty 50

## 2024-01-26 MED ORDER — ALBUTEROL SULFATE (2.5 MG/3ML) 0.083% IN NEBU
10.0000 mg | INHALATION_SOLUTION | Freq: Once | RESPIRATORY_TRACT | Status: AC
  Administered 2024-01-26: 10 mg via RESPIRATORY_TRACT
  Filled 2024-01-26: qty 12

## 2024-01-26 MED ORDER — POLYETHYLENE GLYCOL 3350 17 G PO PACK: 17.0000 g | PACK | Freq: Every day | ORAL | Status: DC

## 2024-01-26 MED ORDER — LACTULOSE 10 GM/15ML PO SOLN
20.0000 g | Freq: Two times a day (BID) | ORAL | Status: DC
  Administered 2024-01-26 (×2): 20 g
  Filled 2024-01-26 (×2): qty 30

## 2024-01-26 MED ORDER — NOREPINEPHRINE 16 MG/250ML-% IV SOLN
0.0000 ug/min | INTRAVENOUS | Status: DC
  Administered 2024-01-26: 7 ug/min via INTRAVENOUS
  Administered 2024-01-27: 27 ug/min via INTRAVENOUS
  Administered 2024-01-27: 35 ug/min via INTRAVENOUS
  Administered 2024-01-27: 33 ug/min via INTRAVENOUS
  Administered 2024-01-28: 10 ug/min via INTRAVENOUS
  Filled 2024-01-26 (×6): qty 250

## 2024-01-26 MED ORDER — NOREPINEPHRINE 4 MG/250ML-% IV SOLN
INTRAVENOUS | Status: AC
  Administered 2024-01-26: 6 ug/min via INTRAVENOUS
  Filled 2024-01-26: qty 250

## 2024-01-26 MED ORDER — POLYETHYLENE GLYCOL 3350 17 G PO PACK: 17.0000 g | PACK | Freq: Every day | ORAL | Status: DC | PRN

## 2024-01-26 MED ORDER — VASOPRESSIN 20 UNITS/100 ML INFUSION FOR SHOCK
0.0000 [IU]/min | INTRAVENOUS | Status: DC
  Administered 2024-01-26: 0.03 [IU]/min via INTRAVENOUS
  Administered 2024-01-27 – 2024-01-28 (×4): 0.04 [IU]/min via INTRAVENOUS
  Administered 2024-01-28: 0.03 [IU]/min via INTRAVENOUS
  Filled 2024-01-26 (×6): qty 100

## 2024-01-26 MED ORDER — DEXTROSE 50 % IV SOLN
50.0000 mL | Freq: Once | INTRAVENOUS | Status: AC
  Administered 2024-01-26: 50 mL via INTRAVENOUS
  Filled 2024-01-26: qty 50

## 2024-01-26 MED ORDER — ROCURONIUM BROMIDE 10 MG/ML (PF) SYRINGE
100.0000 mg | PREFILLED_SYRINGE | Freq: Once | INTRAVENOUS | Status: AC
  Administered 2024-01-26: 100 mg via INTRAVENOUS
  Filled 2024-01-26: qty 10

## 2024-01-26 MED ORDER — SODIUM CHLORIDE 0.9 % IV SOLN
2.0000 g | Freq: Once | INTRAVENOUS | Status: AC
  Administered 2024-01-26: 2 g via INTRAVENOUS
  Filled 2024-01-26: qty 20

## 2024-01-26 MED ORDER — ETOMIDATE 2 MG/ML IV SOLN
10.0000 mg | Freq: Once | INTRAVENOUS | Status: AC
  Administered 2024-01-26: 10 mg via INTRAVENOUS

## 2024-01-26 MED ORDER — SODIUM CHLORIDE 0.9 % IV SOLN
100.0000 mg | Freq: Two times a day (BID) | INTRAVENOUS | Status: DC
  Filled 2024-01-26: qty 100

## 2024-01-26 MED ORDER — AZITHROMYCIN 500 MG IV SOLR: 500.0000 mg | INTRAVENOUS | Status: DC

## 2024-01-26 MED ORDER — AMIODARONE HCL IN DEXTROSE 360-4.14 MG/200ML-% IV SOLN
60.0000 mg/h | INTRAVENOUS | Status: DC
Start: 2024-01-26 — End: 2024-01-26
  Filled 2024-01-26: qty 200

## 2024-01-26 MED ORDER — SODIUM CHLORIDE 0.9 % IV SOLN
2.0000 g | INTRAVENOUS | Status: DC
  Administered 2024-01-27 – 2024-02-01 (×6): 2 g via INTRAVENOUS
  Filled 2024-01-26 (×6): qty 20

## 2024-01-26 MED ORDER — SODIUM CHLORIDE 0.9 % IV SOLN
500.0000 mg | Freq: Once | INTRAVENOUS | Status: AC
  Administered 2024-01-26: 500 mg via INTRAVENOUS
  Filled 2024-01-26: qty 5

## 2024-01-26 MED ORDER — NOREPINEPHRINE 4 MG/250ML-% IV SOLN
0.0000 ug/min | INTRAVENOUS | Status: DC
Start: 2024-01-26 — End: 2024-01-26
  Administered 2024-01-26: 2 ug/min via INTRAVENOUS
  Filled 2024-01-26: qty 250

## 2024-01-26 MED ORDER — PANTOPRAZOLE SODIUM 40 MG IV SOLR
40.0000 mg | Freq: Every day | INTRAVENOUS | Status: DC
  Administered 2024-01-26 – 2024-01-27 (×2): 40 mg via INTRAVENOUS
  Filled 2024-01-26 (×3): qty 10

## 2024-01-26 NOTE — Consult Note (Signed)
 Pharmacy Antibiotic Note  Abigail Molina is a 58 y.o. female admitted on 01/26/2024 with sepsis.  EMS found the patient unresponsive on their couch this morning. Patient was last found normal 2100. Pharmacy has been consulted for Vancomycin dosing.  Today, 01/26/2024 Scr 2.05 (above baseline 1.0) WBC 40.9  Afebrile since admission  Imaging: Retrocardiac opacity is likely to represent atelectasis, however early pneumonia can appear similar.  Plan: Day 1 of antibiotics Give vancomycin Dose by renal function until AKI resolves  Seems to be resolving, will check dosing tomorrow with labs  Patient is also on Doxycycline 100 mg IV Q12H Patient is also on ceftriaxone 2g Q24H Patient is also on rifaximin 500 mg BID  Continue to monitor renal function and follow culture results  Temp (24hrs), Avg:97.9 F (36.6 C), Min:97.8 F (36.6 C), Max:97.9 F (36.6 C)  Recent Labs  Lab 01/26/24 0834 01/26/24 0950 01/26/24 1213  WBC 40.9*  --   --   CREATININE 2.41*  --  2.05*  LATICACIDVEN 6.2* 5.9*  --     CrCl cannot be calculated (Unknown ideal weight.).    No Known Allergies  Antimicrobials this admission: 2/25 Azithromycin x 1 2/25 Ceftriaxone >>  2/25 Rifaximin >>   Dose adjustments this admission: NA  Microbiology results: 2/25 BCx: IP 2/25 UCx: pending  2/25 Sputum: IP  2/25 MRSA PCR: negative  Thank you for allowing pharmacy to be a part of this patient's care.  Effie Shy, PharmD Pharmacy Resident  01/26/2024 4:10 PM

## 2024-01-26 NOTE — H&P (Addendum)
 NAME:  Abigail Molina, MRN:  [id number], DOB:  03/29/1966, LOS: 0 ADMISSION DATE:  01/26/2024, CONSULTATION DATE: 01/26/2024 REFERRING MD: Dr. Roxan Hockey , CHIEF COMPLAINT: Unresponsiveness    History of Present Illness:  This is a 58 yo female who presented to [facility] ER on 02/25 via EMS from home after being found unresponsive at her home by family.  It was reported her last known well time was 2100 on 02/24.  When EMS arrived on the scene pt found to be hypotensive bp 56/29 and febrile axillary temp 100.2 F.    ED Course  In the ER pt remained hypotensive and unresponsive requiring mechanical intubation.  She met sepsis protocol and received: 3L NS bolus/azithromycin/ceftriaxone.  She remained hypotensive requiring levophed gtt.  Significant lab results were: Na+ 115/K+ 6.2/chloride 86/CO2 17/glucose 126/BUN 67/creatinine 2.41/calcium 8.6/alk phos 204/albumin 1.9/AST 71/lactic acid 6.2/wbc 40.9/hgb 7.2/PT 18.8/INR 1.6.  UA results: small leukocytes/glucose >= 500/rare bacteria.  CT Head/Cervical Spine negative for acute abnormality.  CT Abd/Pelvis negative for acute abdominopelvic process, but revealed cirrhosis with portal hypertension and splenomegaly; trace left pleural effusion; and early pneumonia vs. atelectasis.  Due to severe hyponatremia pt received a 100 ml hypertonic saline bolus.  PCCM team contacted for ICU admission.    Pertinent  Medical History  Anemia  Anxiety  Arthritis  Endometrial Cancer  Chronic Pain  Cirrhosis of Liver  Depression  Type II Diabetes Mellitus  NASH  GERD Glaucoma  HLD HTN  Meningioma OSA  Overactive Bladder  Psoriasis   Micro Data:  02/25: MRSA PCR>>positive  02/25: Resp panel by RT-PCR>>negative  02/25: Blood x2>> 02/25: Urine>> 02/25: Tracheal aspirate>>  Significant Hospital Events: Including procedures, antibiotic start and stop dates in addition to other pertinent events   02/25: Admitted with acute toxic metabolic encephalopathy, acute  kidney injury, hyperkalemia, anion gap metabolic acidosis, hypotension, intermittent wide complex tachycardia, and intermittent bradycardia requiring mechanical intubation and levophed gtt   Interim History / Subjective:  Upon arrival to the ER pt with intermittent wide complex tachycardia and periods of bradycardia.  She also became severely hypoxic requiring vent changes: FIO2 increased to 100%/PEEP 12.  She is also requiring levophed gtt @7  mcg/min to maintain map 65 or higher.  She received 150 mg bolus of amiodarone in the ER to treat runs of vtach, however she became bradycardia hr 40's following administration    Objective   Blood pressure (!) 82/43, pulse 84, temperature 97.8 F (36.6 C), temperature source Rectal, resp. rate 15, last menstrual period 10/15/2016, SpO2 93%.    Vent Mode: PRVC FiO2 (%):  [40 %] 40 % Set Rate:  [15 bmp] 15 bmp Vt Set:  [450 mL] 450 mL PEEP:  [5 cmH20] 5 cmH20   Intake/Output Summary (Last 24 hours) at 01/26/2024 1130 Last data filed at 01/26/2024 1039 Gross per 24 hour  Intake 2416.67 ml  Output --  Net 2416.67 ml   There were no vitals filed for this visit.  Examination: General: Acutely-ill appearing female, NAD mechanically intubated  HENT: Supple, no JVD  Lungs: Faint rhonchi throughout, even, non labored  Cardiovascular: Sinus tachycardia, s1s2, no m/r/g, 2+ radial/1+ distal pulses, no edema   Abdomen: +BS x4, obese mildly distended Extremities: Normal bulk and tone Skin: Stage II sacral spine pressure ulcer with erythema with abrasions present on buttocks present on admission  Neuro: Sedated, not following commands or withdrawing from painful stimulation, PERRL GU: Indwelling foley catheter draining yellow urine   Resolved Hospital Problem  list     Assessment & Plan:   #Acute toxic metabolic encephalopathy  #Mechanical intubation pain/discomfort  Hx: Meningioma and seizures  - Correct metabolic derangements  - Avoid sedating  medications as able  - Urine drug screen pending  - Maintain RASS goal 0 to -1 - PAD protocol to maintain RASS goal: prn versed and fentanyl gtts  - WUA daily as able  - Resume outpatient keppra  - EEG pending   #Septic shock  #Mildly elevated troponin suspect secondary to demand ischemia  #Intermittent wide complex tachycardia  #Intermittent vtach #Intermittent bradycardia  Hx: HTN and HLD  - Continuous telemetry monitoring  - Trend troponin's until peaked  - Correct electrolyte abnormality  - If pt continues to have arrhythmias following correction of electrolytes will consult Cardiology  - IV fluid resuscitation and prn levophed gtt to maintain map 65 or higher  - Hold outpatient antihypertensives and diuretic therapy   #Acute respiratory failure  #Possible pneumonia  #Mechanical intubation  Hx: OSA  - Full vent support for now: vent settings reviewed and established - Continue lung protective strategies  - Maintain plateau pressures less than 30 cm H2O - SBT once all parameters met - VAP bundle implemented - Intermittent CXR's and ABG's   #Severe hyponatremia  #Acute kidney injury secondary to ATN  #Non anion gap metabolic acidosis  #Hyperkalemia  #Lactic acidosis  - Trend BMP, lactic acid, and vbg  - Replace electrolytes as indicated  - Strict I&O's - Will not start hypertonic saline sodium increased from 115 to 120 following 100 ml hypertonic bolus  - Nephrology consulted appreciate input: pt may require dialysis   #NASH cirrhosis  #Portal hypertension  Hx: Esophageal varices  - Trend hepatic function panel and coags  - Avoid hepatotoxic medications as able  - Resume outpatient lactulose and rifaximin   #Sepsis  #Possible pneumonia  - Trend WBC and monitor fever curve  - Trend PCT  - Follow cultures  - Continue abx as outlined above pending culture results and sensitivities   #Anemia with no signs of obvious bleeding  - Trend CBC  - Monitor for s/sx of  bleeding  - Transfuse for hgb <7  #Type II diabetes mellitus  - CBG's q4hrs  - SSI  - Target CBG's 140 to 180's - Follow hypo/hyperglycemic protocol   #Stage II pressure ulcer present on admission  - Wound care consulted appreciate input  - Turn q2hrs   Best Practice (right click and "Reselect all SmartList Selections" daily)   Diet/type: NPO DVT prophylaxis prophylactic heparin  Pressure ulcer(s): present on admission  GI prophylaxis: PPI Lines: Central line Foley:  Yes, and it is still needed Code Status:  full code Last date of multidisciplinary goals of care discussion [N/A]  Labs   CBC: Recent Labs  Lab 01/26/24 0834  WBC 40.9*  NEUTROABS 36.1*  HGB 7.2*  HCT 21.9*  MCV 80.8  PLT 185    Basic Metabolic Panel: Recent Labs  Lab 01/26/24 0834 01/26/24 0950  NA 115* 117*  K 6.2*  --   CL 86*  --   CO2 17*  --   GLUCOSE 126*  --   BUN 67*  --   CREATININE 2.41*  --   CALCIUM 8.6*  --    GFR: CrCl cannot be calculated (Unknown ideal weight.). Recent Labs  Lab 01/26/24 0834 01/26/24 0950  WBC 40.9*  --   LATICACIDVEN 6.2* 5.9*    Liver Function Tests: Recent Labs  Lab 01/26/24 0834  AST 71*  ALT 26  ALKPHOS 204*  BILITOT 5.1*  PROT 6.4*  ALBUMIN 1.9*   No results for input(s): "LIPASE", "AMYLASE" in the last 168 hours. Recent Labs  Lab 01/26/24 0834  AMMONIA 52*    ABG    Component Value Date/Time   PHART 7.27 (L) 01/26/2024 1044   PCO2ART 30 (L) 01/26/2024 1044   PO2ART 127 (H) 01/26/2024 1044   HCO3 13.8 (L) 01/26/2024 1044   ACIDBASEDEF 11.8 (H) 01/26/2024 1044   O2SAT 99.4 01/26/2024 1044     Coagulation Profile: Recent Labs  Lab 01/26/24 0834  INR 1.6*    Cardiac Enzymes: No results for input(s): "CKTOTAL", "CKMB", "CKMBINDEX", "TROPONINI" in the last 168 hours.  HbA1C: Hemoglobin A1C  Date/Time Value Ref Range Status  11/04/2022 12:00 AM 7.8  Final    Comment:    care everywhere  12/06/2015 12:00 AM 5.6   Final   Hgb A1c MFr Bld  Date/Time Value Ref Range Status  11/17/2018 09:42 AM 9.0 (H) 4.8 - 5.6 % Final    Comment:             Prediabetes: 5.7 - 6.4          Diabetes: >6.4          Glycemic control for adults with diabetes: <7.0   09/02/2018 06:46 PM 7.5 (H) 4.8 - 5.6 % Final    Comment:             Prediabetes: 5.7 - 6.4          Diabetes: >6.4          Glycemic control for adults with diabetes: <7.0     CBG: No results for input(s): "GLUCAP" in the last 168 hours.  Review of Systems:   Unable to assess pt mechanically intubated   Past Medical History:  She,  has a past medical history of Anemia, Anxiety, Arthritis, Cancer (HCC), Chronic pain, Cirrhosis of liver (HCC), Depression, Diabetes mellitus without complication (HCC), Fatty liver disease, nonalcoholic (2021), GERD (gastroesophageal reflux disease), Glaucoma, Hyperlipidemia, Hypertension, Meningioma (HCC), OSA (obstructive sleep apnea) (05/08/2023), Overactive bladder, Pharmacologic therapy (11/02/2023), and Psoriasis (2015).   Surgical History:   Past Surgical History:  Procedure Laterality Date   ABDOMINAL HYSTERECTOMY  2007   CHOLECYSTECTOMY  2000     Social History:   reports that she has never smoked. She has never used smokeless tobacco. She reports that she does not currently use alcohol. She reports that she does not use drugs.   Family History:  Her family history includes Alcohol abuse in her cousin and maternal grandmother; Bipolar disorder in her cousin; Depression in her mother; Diabetes in her mother; Heart disease in her maternal grandfather; Hypertension in her mother; Obesity in her mother; Stroke in her paternal grandfather; Vision loss in her mother.   Allergies No Known Allergies   Home Medications  Prior to Admission medications   Medication Sig Start Date End Date Taking? Authorizing Provider  aspirin 81 MG tablet Take 81 mg by mouth daily.   Yes [provider]  Baclofen 5 MG  TABS Take 2 tablets by mouth at bedtime.   Yes [provider]  buPROPion (WELLBUTRIN XL) 150 MG 24 hr tablet Take 1 tablet (150 mg total) by mouth daily. 11/16/23 03/15/24 Yes Hisada, Barbee Cough, MD  canagliflozin (INVOKANA) 300 MG TABS tablet Take by mouth. 02/17/22  Yes [provider]  carvedilol (COREG) 6.25 MG tablet Take 6.25  mg by mouth 2 (two) times daily. 06/19/22  Yes [provider]  citalopram (CELEXA) 20 MG tablet Take 20 mg by mouth daily.   Yes [provider]  Continuous Glucose Sensor (FREESTYLE LIBRE 3 SENSOR) MISC by Does not apply route daily.   Yes [provider]  COSENTYX SENSOREADY, 300 MG, 150 MG/ML SOAJ Inject into the skin.   Yes [provider]  Ferrous Sulfate (SLOW FE) 142 (45 Fe) MG TBCR Take by mouth. 09/12/21  Yes [provider]  furosemide (LASIX) 40 MG tablet Take 40 mg by mouth 2 (two) times daily. 09/25/23  Yes [provider]  gabapentin (NEURONTIN) 300 MG capsule Take by mouth once. 06/02/23 06/01/24 Yes [provider]  insulin lispro (HUMALOG) 100 UNIT/ML KwikPen Inject into the skin.   Yes [provider]  lactulose (CHRONULAC) 10 GM/15ML solution SMARTSIG:Milliliter(s) By Mouth 06/19/22  Yes [provider]  LANTUS SOLOSTAR 100 UNIT/ML Solostar Pen Inject 66 Units into the skin daily. 11/25/22 01/26/24 Yes [provider]  levETIRAcetam (KEPPRA) 500 MG tablet Take 750 mg by mouth 2 (two) times daily. 01/04/24 07/02/24 Yes [provider]  MAGNESIUM-OXIDE 400 (240 Mg) MG tablet Take 2 tablets by mouth 2 (two) times daily. 10/14/23  Yes [provider]  metFORMIN (GLUCOPHAGE-XR) 500 MG 24 hr tablet Take 1,000 mg by mouth 2 (two) times daily.   Yes [provider]  mirtazapine (REMERON) 15 MG tablet Take 1 tablet (15 mg total) by mouth at bedtime. 11/28/23 01/26/24 Yes Hisada, Barbee Cough, MD  ondansetron (ZOFRAN-ODT) 4 MG disintegrating tablet  Dissolve 1 tablet (4 mg total) in the mouth every twelve (12) hours as needed for nausea. 10/05/23 10/04/24 Yes [provider]  pantoprazole (PROTONIX) 40 MG tablet TAKE ONE TABLET BY MOUTH EVERY DAY 10/24/19  Yes McGowan, Carollee Herter A, PA-C  promethazine (PHENERGAN) 12.5 MG tablet Take by mouth. 05/23/22  Yes [provider]  scopolamine (TRANSDERM-SCOP) 1 MG/3DAYS Place 1 patch onto the skin every 3 (three) days. 12/07/23  Yes [provider]  spironolactone (ALDACTONE) 100 MG tablet Take 100 mg by mouth 2 (two) times daily.   Yes [provider]  XIFAXAN 550 MG TABS tablet Take 550 mg by mouth 2 (two) times daily.   Yes [provider]  zolpidem (AMBIEN) 5 MG tablet Take 1 tablet (5 mg total) by mouth at bedtime as needed for sleep. 11/01/23 01/26/24 Yes Neysa Hotter, MD  Accu-Chek Softclix Lancets lancets SMARTSIG:Topical    [provider]  BD PEN NEEDLE NANO 2ND GEN 32G X 4 MM MISC USE 1 NIGHTLY 09/28/23   [provider]  chlorhexidine (PERIDEX) 0.12 % solution SMARTSIG:0.5 Capful(s) By Mouth Morning-Evening Patient not taking: Reported on 01/26/2024 10/24/23   [provider]  clotrimazole (MYCELEX) 10 MG troche Take 10 mg by mouth 5 (five) times daily. Patient not taking: Reported on 01/26/2024 08/20/23   [provider]  Continuous Glucose Receiver (DEXCOM G7 RECEIVER) DEVI USE AS INSTRUCTED WITH G7 SENSORS Patient not taking: Reported on 01/26/2024 09/04/23   [provider]  Continuous Glucose Sensor (DEXCOM G7 SENSOR) MISC USE TO MONITOR BLOOD GLUCOSE LEVELS CONTINUOUSLY. CHANGE SENSOR EVERY 10 DAYS. Patient not taking: Reported on 01/26/2024    [provider]  Dulaglutide (TRULICITY) 1.5 MG/0.5ML SOPN Inject 1.5 mg into the skin once a week. Thursdays Patient not taking: Reported on 01/26/2024    [provider]  insulin glargine-yfgn (SEMGLEE, YFGN,) 100 UNIT/ML Pen INJECT 50  UNITS UNDER  THE SKIN NIGHTLY AS DIRECTED BY YOUR DOCTOR AROUND SAME TIME EVERY DAY. TOTAL DAILY DOSE NOT TO EXCEED 50 UNITS Patient not taking: Reported on 01/26/2024 10/06/23   [provider]  Insulin Pen Needle 32G X 6 MM MISC 1 Syringe by Does not apply route. Use with Victoza.    [provider]  naltrexone (DEPADE) 50 MG tablet Take 50 mg by mouth daily.    [provider]  Secukinumab (COSENTYX SENSOREADY PEN) 150 MG/ML SOAJ Inject the contents of 2 pens (300 mg total) under the skin once a week at weeks 0, 1, 2, 3, and 4. THEN inject the contents of 2 pens (300 mg total) every 4 weeks thereafter. Patient not taking: Reported on 01/26/2024 09/30/23   [provider]  Sharps Container (BD SHARPS COLLECTOR) MISC Use as directed to dispose of Cosentyx pens. 10/05/23   [provider]  simvastatin (ZOCOR) 20 MG tablet TAKE ONE TABLET BY MOUTH EVERY EVENING 10/24/19   Marvel Plan [place] Hampshire, PA-C     Critical care time: 80 minutes      Zada Girt, AGNP  Pulmonary/Critical Care Pager [phone] (please enter 7 digits) PCCM Consult Pager [phone] (please enter 7 digits)

## 2024-01-26 NOTE — Sepsis Progress Note (Signed)
 Elink will follow per sepsis protocol.

## 2024-01-26 NOTE — Progress Notes (Signed)
 Updated pts brother and cousin at bedside regarding pts condition and current plan of care.  All questions were answered.    Zada Girt, AGNP  Pulmonary/Critical Care Pager [phone] (please enter 7 digits) PCCM Consult Pager [phone] (please enter 7 digits)

## 2024-01-26 NOTE — ED Notes (Signed)
 Patient back from CT with RN and RT.

## 2024-01-26 NOTE — ED Notes (Signed)
 Patient had episode of vtach, MD paged to bedside. EKG obtained, MD reports he will place order for Amiodarone.

## 2024-01-26 NOTE — Progress Notes (Signed)
 PHARMACY CONSULT NOTE - FOLLOW UP  Pharmacy Consult for Electrolyte Monitoring and Replacement   Recent Labs: Potassium (mmol/L)  Date Value  01/26/2024 6.2 (H)   Magnesium (mg/dL)  Date Value  84/69/6295 2.0   Calcium (mg/dL)  Date Value  28/41/3244 8.6 (L)   Albumin (g/dL)  Date Value  12/03/7251 1.9 (L)  11/17/2018 3.9   Sodium (mmol/L)  Date Value  01/26/2024 117 (LL)  11/17/2018 141     Assessment: Patient is a 58 year old female with a past medical history of chronic pain syndrome, cirrhosis, and diabetes who was found unresponsive on her couch this morning. Pharmacy has been consulted to monitor and replace electrolytes as needed.  Patient's K is currently elevated at 6.2. MD has ordered insulin + dextrose as well as calcium gluconate to treat.   No updated Mag or phos levels, will order them as add-on to previous labs collected.  Goal of Therapy:  Electrolytes WNL  Plan:  Follow-up K level to ensure correction of hyperkalemia  Follow-up Mag and Phos levels this afternoon to ensure no additional supplementation is needed  Merryl Hacker ,PharmD Clinical Pharmacist 01/26/2024 10:56 AM

## 2024-01-26 NOTE — Progress Notes (Addendum)
 Patient arrived around 1140. During CHG wipe down and turn patient desated into the 60s, HR brady into the 40s. RT and Zada Girt NP at bedside. EKG obtained x2. Insulin, Calcium gluconate, D50 and sodium bicarb given for potassium of 6.2. Fentanyl and Versed stopped. See MAR and flowsheets for more info.

## 2024-01-26 NOTE — Procedures (Signed)
 Patient Name: SEVEN MARENGO  MRN: [id number]  Epilepsy Attending: Charlsie Quest  Referring Physician/Provider: Ezequiel Essex, NP  Date: 01/26/2024 Duration: 32.31 mins  Patient history: 58yo female with ams. EEG to evaluate for seizure  Level of alertness: comatose  AEDs during EEG study: LEV  Technical aspects: This EEG study was done with scalp electrodes positioned according to the 10-20 International system of electrode placement. Electrical activity was reviewed with band pass filter of 1-70Hz , sensitivity of 7 uV/mm, display speed of 14mm/sec with a 60Hz  notched filter applied as appropriate. EEG data were recorded continuously and digitally stored.  Video monitoring was available and reviewed as appropriate.  Description: EEG showed continuous generalized 3 to 5 Hz theta-delta slowing. Hyperventilation and photic stimulation were not performed.     ABNORMALITY - Continuous slow, generalized  IMPRESSION: This study is suggestive of severe diffuse encephalopathy. No seizures or epileptiform discharges were seen throughout the recording.  Giovonnie Trettel Annabelle Harman

## 2024-01-26 NOTE — ED Notes (Addendum)
 Per Annabelle Harman, NP hold on Amiodarone due to bradycardia.

## 2024-01-26 NOTE — Plan of Care (Signed)
  Problem: Clinical Measurements: Goal: Respiratory complications will improve Outcome: Progressing Goal: Cardiovascular complication will be avoided Outcome: Progressing   Problem: Elimination: Goal: Will not experience complications related to urinary retention Outcome: Progressing   Problem: Safety: Goal: Ability to remain free from injury will improve Outcome: Progressing   Problem: Education: Goal: Knowledge of General Education information will improve Description: Including pain rating scale, medication(s)/side effects and non-pharmacologic comfort measures Outcome: Not Progressing   Problem: Health Behavior/Discharge Planning: Goal: Ability to manage health-related needs will improve Outcome: Not Progressing   Problem: Activity: Goal: Risk for activity intolerance will decrease Outcome: Not Progressing   Problem: Nutrition: Goal: Adequate nutrition will be maintained Outcome: Not Progressing

## 2024-01-26 NOTE — ED Notes (Signed)
 Increased O2 to 6L, patient is mouth breather. Placed [place] in mouth with no improvement. MD made aware.

## 2024-01-26 NOTE — ED Notes (Signed)
 Titrated LR @ 150 to bolus per verbal order by Dr. Roxan Hockey.

## 2024-01-26 NOTE — ED Provider Notes (Signed)
 [facility] Provider Note    Event Date/Time   First MD Initiated Contact with Patient 01/26/24 0825     (approximate)   History   Altered Mental Status  Level V Caveat: AMS  HPI  Abigail Molina is a 58 y.o. female history of chronic pain syndrome as well as cirrhosis, diabetes presents to the ER for evaluation of altered mental status.  Patient unable to provide much additional history.  Reportedly found to be hypotensive with EMS.  Was last seen normal by family friend yesterday evening checked on her.  Checked on this morning found her unresponsive and not responding.     Physical Exam   Triage Vital Signs: ED Triage Vitals [01/26/24 0828]  Encounter Vitals Group     BP (!) 67/38     Systolic BP Percentile      Diastolic BP Percentile      Pulse Rate 83     Resp 14     Temp      Temp src      SpO2 100 %     Weight      Height      Head Circumference      Peak Flow      Pain Score      Pain Loc      Pain Education      Exclude from Growth Chart     Most recent vital signs: Vitals:   01/26/24 1030 01/26/24 1045  BP: 107/65 97/61  Pulse: (!) 109 (!) 111  Resp: 15 15  Temp:    SpO2: 94% 94%     Constitutional: Encephalopathic.  Spontaneous respirations.  Will occasionally moan "mom" Eyes: Conjunctivae are normal.  Head: Atraumatic. Nose: No congestion/rhinnorhea. Mouth/Throat: Mucous membranes are dry Neck: Painless ROM.  Cardiovascular:   Delayed cap refill.  Mildly tachycardic Respiratory: Rhonchorous breath sounds on left lower lobe..  Gastrointestinal: Soft and nontender.  Musculoskeletal:  no deformity Neurologic: Encephalopathic not following commands. Skin:  Skin is warm, dry and intact. No rash noted.    ED Results / Procedures / Treatments   Labs (all labs ordered are listed, but only abnormal results are displayed) Labs Reviewed  LACTIC ACID, PLASMA - Abnormal; Notable for the following components:      Result  Value   Lactic Acid, Venous 6.2 (*)    All other components within normal limits  LACTIC ACID, PLASMA - Abnormal; Notable for the following components:   Lactic Acid, Venous 5.9 (*)    All other components within normal limits  COMPREHENSIVE METABOLIC PANEL - Abnormal; Notable for the following components:   Sodium 115 (*)    Potassium 6.2 (*)    Chloride 86 (*)    CO2 17 (*)    Glucose, Bld 126 (*)    BUN 67 (*)    Creatinine, Ser 2.41 (*)    Calcium 8.6 (*)    Total Protein 6.4 (*)    Albumin 1.9 (*)    AST 71 (*)    Alkaline Phosphatase 204 (*)    Total Bilirubin 5.1 (*)    GFR, Estimated 23 (*)    All other components within normal limits  CBC WITH DIFFERENTIAL/PLATELET - Abnormal; Notable for the following components:   WBC 40.9 (*)    RBC 2.71 (*)    Hemoglobin 7.2 (*)    HCT 21.9 (*)    RDW 19.8 (*)    Neutro Abs 36.1 (*)  Monocytes Absolute 1.3 (*)    Abs Immature Granulocytes 1.44 (*)    All other components within normal limits  PROTIME-INR - Abnormal; Notable for the following components:   Prothrombin Time 18.8 (*)    INR 1.6 (*)    All other components within normal limits  URINALYSIS, W/ REFLEX TO CULTURE (INFECTION SUSPECTED) - Abnormal; Notable for the following components:   Color, Urine YELLOW (*)    APPearance HAZY (*)    Glucose, UA >=500 (*)    Hgb urine dipstick MODERATE (*)    Leukocytes,Ua SMALL (*)    Bacteria, UA RARE (*)    All other components within normal limits  AMMONIA - Abnormal; Notable for the following components:   Ammonia 52 (*)    All other components within normal limits  BLOOD GAS, VENOUS - Abnormal; Notable for the following components:   pCO2, Ven 31 (*)    Bicarbonate 17.5 (*)    Acid-base deficit 6.8 (*)    All other components within normal limits  SODIUM - Abnormal; Notable for the following components:   Sodium 117 (*)    All other components within normal limits  BLOOD GAS, ARTERIAL - Abnormal; Notable for the  following components:   pH, Arterial 7.27 (*)    pCO2 arterial 30 (*)    pO2, Arterial 127 (*)    Bicarbonate 13.8 (*)    Acid-base deficit 11.8 (*)    All other components within normal limits  RESP PANEL BY RT-PCR (RSV, FLU A&B, COVID)  RVPGX2  CULTURE, BLOOD (ROUTINE X 2)  CULTURE, BLOOD (ROUTINE X 2)  URINE CULTURE  APTT  OSMOLALITY, URINE  SODIUM, URINE, RANDOM  SODIUM  SODIUM  SODIUM  SODIUM  OSMOLALITY  ACETAMINOPHEN LEVEL  HIV ANTIBODY (ROUTINE TESTING W REFLEX)  CBC  CREATININE, SERUM  MAGNESIUM  PHOSPHORUS     EKG  ED ECG REPORT I, Willy Eddy, the attending physician, personally viewed and interpreted this ECG.   Date: 01/26/2024  EKG Time: 8:29  Rate: 80  Rhythm: sinus  Axis: normal  Intervals: normal qt  ST&T Change: hyperacute t wave abnm    RADIOLOGY Please see ED Course for my review and interpretation.  I personally reviewed all radiographic images ordered to evaluate for the above acute complaints and reviewed radiology reports and findings.  These findings were personally discussed with the patient.  Please see medical record for radiology report.    PROCEDURES:  Critical Care performed: Yes, see critical care procedure note(s)  .Critical Care  Performed by: Willy Eddy, MD Authorized by: Willy Eddy, MD   Critical care provider statement:    Critical care time (minutes):  45   Critical care was necessary to treat or prevent imminent or life-threatening deterioration of the following conditions:  Shock   Critical care was time spent personally by me on the following activities:  Ordering and performing treatments and interventions, ordering and review of laboratory studies, ordering and review of radiographic studies, pulse oximetry, re-evaluation of patient's condition, review of old charts, obtaining history from patient or surrogate, examination of patient, evaluation of patient's response to treatment, discussions  with primary provider, discussions with consultants and development of treatment plan with patient or surrogate Procedure Name: Intubation Date/Time: 01/26/2024 10:37 AM  Performed by: Willy Eddy, MDPre-anesthesia Checklist: Patient identified, Emergency Drugs available, Suction available and Patient being monitored Preoxygenation: Pre-oxygenation with 100% oxygen Induction Type: IV induction and Rapid sequence Laryngoscope Size: Glidescope and 3 Tube size:  7.5 mm Placement Confirmation: ETT inserted through vocal cords under direct vision, CO2 detector and Breath sounds checked- equal and bilateral Dental Injury: Teeth and Oropharynx as per pre-operative assessment        MEDICATIONS ORDERED IN ED: Medications  lactated ringers infusion ( Intravenous New Bag/Given 01/26/24 0917)  norepinephrine (LEVOPHED) 4mg  in (0.016 mg/mL) premix infusion (6 mcg/min Intravenous Rate/Dose Change 01/26/24 1026)  fentaNYL in NS (9mcg/ml) infusion-PREMIX (25 mcg/hr Intravenous New Bag/Given 01/26/24 1047)  midazolam (VERSED) 100 mg/100 mL (1 mg/mL) premix infusion (0.5 mg/hr Intravenous New Bag/Given 01/26/24 1043)  docusate sodium (COLACE) capsule 100 mg (has no administration in time range)  polyethylene glycol (MIRALAX / GLYCOLAX) packet 17 g (has no administration in time range)  heparin injection 5,000 Units (has no administration in time range)  docusate (COLACE) 50 MG/5ML liquid 100 mg (has no administration in time range)  polyethylene glycol (MIRALAX / GLYCOLAX) packet 17 g (has no administration in time range)  amiodarone (NEXTERONE) 1.8 mg/mL load via infusion 150 mg (has no administration in time range)    Followed by  amiodarone (NEXTERONE PREMIX) 360-4.14 MG/200ML-% (1.8 mg/mL) IV infusion (has no administration in time range)    Followed by  amiodarone (NEXTERONE PREMIX) 360-4.14 MG/200ML-% (1.8 mg/mL) IV infusion (has no administration in time range)  sodium  chloride 0.9 % bolus 1,000 mL (0 mLs Intravenous Stopped 01/26/24 0906)    And  sodium chloride 0.9 % bolus 1,000 mL (0 mLs Intravenous Stopped 01/26/24 0906)  cefTRIAXone (ROCEPHIN) 2 g in sodium chloride 0.9 % 100 mL IVPB (0 g Intravenous Stopped 01/26/24 0906)  azithromycin (ZITHROMAX) 500 mg in sodium chloride 0.9 % 250 mL IVPB (0 mg Intravenous Stopped 01/26/24 1012)  rocuronium (ZEMURON) injection 100 mg (100 mg Intravenous Given 01/26/24 0936)  etomidate (AMIDATE) injection 10 mg (10 mg Intravenous Given 01/26/24 0936)  sodium chloride (hypertonic) 3 % solution (0 mL/hr Intravenous Stopped 01/26/24 1039)     IMPRESSION / MDM / ASSESSMENT AND PLAN / ED COURSE  I reviewed the triage vital signs and the nursing notes.                              Differential diagnosis includes, but is not limited to, Dehydration, sepsis, pna, uti, hypoglycemia, cva, drug effect, withdrawal, encephalitis  Patient presenting to the ER for evaluation of symptoms as described above.  Based on symptoms, risk factors and considered above differential, this presenting complaint could reflect a potentially life-threatening illness therefore the patient will be placed on continuous pulse oximetry and telemetry for monitoring.  Laboratory evaluation will be sent to evaluate for the above complaints.      Clinical Course as of 01/26/24 1426  Tue Jan 26, 2024  0907 On reassessment patient's blood pressure is dropping she is becoming more somnolent concern for airway protection.  Will intubate.  Noted significant metabolic derangements including significant hyponatremia AKI metabolic acidosis as well as elevated lactic acidosis PT/INR. [PR]  [phone] Patient has received fluid resuscitation becoming increasingly hypoxic requiring supplemental oxygen by nasal cannula.  Will start norepinephrine. [PR]  0946 Pressure is improving with nor epi as well as additional IV fluid resuscitation.  Will give hypertonic saline due to  significant obtundation and hyponatremia.  Patient tolerated intubation without complication.  Will send for CT to further evaluate. [PR]  1031 CT imaging of the head on my review and interpretation without evidence of SDH  or IPH.  CT imaging of the abdomen does appear to show evidence of left lower lobe consolidation and effusion.  She has received IV antibiotics.  Given her sepsis with shock will consult ICU for admission. [PR]  1056 Patient just had brief episode of nonsustained V. tach.  CPR was initiated by nursing staff.  By the time I got to the room she had regained spontaneous circulation in a sinus rhythm.  Will order amiodarone IV. [PR]  1059 Discussed patient's critical condition with the patient's daughter and next of kin.  States that she monitors her medication intake and states that she does not think that she could have overdosed on anything last night.  States that she was a little bit sleepier yesterday than usual but went to bed otherwise normal. [PR]    Clinical Course User Index [PR] Willy Eddy, MD     FINAL CLINICAL IMPRESSION(S) / ED DIAGNOSES   Final diagnoses:  Shock (HCC)  Encephalopathy, unspecified type  Acute hyponatremia  Ventricular tachycardia (HCC)     Rx / DC Orders   ED Discharge Orders     None        Note:  This document was prepared using Dragon voice recognition software and may include unintentional dictation errors.    Willy Eddy, MD 01/26/24 [phone]

## 2024-01-26 NOTE — ED Triage Notes (Signed)
 EMS reports family found patient unresponsive on couch this morning; initial BP 56/29, axillary temp 100.2, BG 175. Last time patient was found normal 2100.

## 2024-01-26 NOTE — ED Notes (Signed)
 Patient placed on 4L NC

## 2024-01-26 NOTE — Progress Notes (Signed)
 PHARMACY CONSULT NOTE - Hypertonic Saline  Pharmacy Consult for Hypertonic Saline Monitoring and Management  Recent Labs: Potassium (mmol/L)  Date Value  01/26/2024 6.2 (H)   Magnesium (mg/dL)  Date Value  69/62/9528 2.0   Calcium (mg/dL)  Date Value  41/32/4401 8.6 (L)   Albumin (g/dL)  Date Value  02/72/5366 1.9 (L)  11/17/2018 3.9   Sodium (mmol/L)  Date Value  01/26/2024 115 (LL)  11/17/2018 141    Assessment  Abigail Molina is a 58 y.o. female presenting due to being found unresponsive on couch this morning by her family. Pharmacy has been consulted to monitor hypertonic saline (3%) infusion.  Pertinent medications: None  Baseline Labs: Serum Na 115  Goal of Therapy:  Increase in Na by 4-6 mEq/L in 4-6 hours Do not exceed increase in Na by 10-12 mEq/L in 24 hours  Monitoring:  Date Time Na Rate/Comment    Plan:  Give 3% NaCl 100 ml x 1 Check Na Q2H x2 then Q4H  Stop infusion if  Na increases > 4 mEq/L in first 2 hours Na increases > 6 mEq/L in first 4 hours Na increases > 6 mEq/L in 6 hours  Na increases > 8 mEq/L in 8 hours (give D5W bolus) Continue to monitor for signs of clinical improvement and recommendations per nephrology  Merryl Hacker PharmD Clinical Pharmacist 01/26/24 9:56 AM

## 2024-01-26 NOTE — ED Notes (Signed)
 Updated sister Alycia Rossetti on status of patient via telephone.

## 2024-01-26 NOTE — Progress Notes (Signed)
 Eeg done

## 2024-01-26 NOTE — ED Notes (Signed)
 IVF x 2 on pressure bags

## 2024-01-26 NOTE — Consult Note (Signed)
 CODE SEPSIS - PHARMACY COMMUNICATION  **Broad Spectrum Antibiotics should be administered within 1 hour of Sepsis diagnosis**  Time Code Sepsis Called/Page Received: 0830  Antibiotics Ordered: azithromycin, ceftriaxone  Time of 1st antibiotic administration: 0846  Additional action taken by pharmacy: n/a  If necessary, Name of Provider/Nurse Contacted: n/a    Bettey Costa ,PharmD Clinical Pharmacist  01/26/2024  8:40 AM

## 2024-01-26 NOTE — Procedures (Signed)
 Central Venous Catheter Insertion Procedure Note  Abigail Molina  [id number]  [birth year]  Date:01/26/24  Time:2:26 PM   Provider Performing:Henny Strauch Earnest Conroy   Procedure: Insertion of Non-tunneled Central Venous Catheter(36556) with US guidance (56213)   Indication(s) Medication administration  Consent Emergently placed   Anesthesia Topical only with 1% lidocaine   Timeout Verified patient identification, verified procedure, site/side was marked, verified correct patient position, special equipment/implants available, medications/allergies/relevant history reviewed, required imaging and test results available.  Sterile Technique Maximal sterile technique including full sterile barrier drape, hand hygiene, sterile gown, sterile gloves, mask, hair covering, sterile ultrasound probe cover (if used).  Procedure Description Area of catheter insertion was cleaned with chlorhexidine and draped in sterile fashion.  With real-time ultrasound guidance a central venous catheter was placed into the left internal jugular vein. Nonpulsatile blood flow and easy flushing noted in all ports.  The catheter was sutured in place and sterile dressing applied.  Complications/Tolerance None; patient tolerated the procedure well. Chest X-ray is ordered to verify placement for internal jugular or subclavian cannulation.   Chest x-ray is not ordered for femoral cannulation.  EBL Minimal  Specimen(s) None  Zada Girt, AGNP  Pulmonary/Critical Care Pager [phone] (please enter 7 digits) PCCM Consult Pager [phone] (please enter 7 digits)

## 2024-01-26 NOTE — ED Notes (Signed)
Patient to CT with this RN and RT. °

## 2024-01-26 NOTE — Consult Note (Signed)
 [facility]  CONSULT NOTE    Date: 01/26/2024                  Patient Name:  Abigail Molina  MRN: [id number]  DOB: 1965/12/28  Age / Sex: 58 y.o., female         PCP: [facility]                 Service Requesting Consult: Critical care team                 Reason for Consult: Hyponatremia, acute kidney injury            History of Present Illness: Abigail Molina is a 58 y.o.  female with past medical history anemia, depression, diabetes, liver cirrhosis, GERD, hypertension, OSA, who was admitted to [facility] on 01/26/2024 for Acute hyponatremia [E87.1] Shock (HCC) [R57.9] Ventricular tachycardia (HCC) [I47.20] Septic shock (HCC) [A41.9, R65.21] Encephalopathy, unspecified type [G93.40]  Patient found unresponsive by family at home. EMS called and patient found to be hypotensive. Now in ICU, intubated on vent support. 40% FiO2. No sedation currently. Pressors Levophed and phenylephrine. Foley catheter with adequate urine.   Labs on ED arrival concerning for sodium 115, potassium 6.2, serum bicarb 17, BUN 67, creatinine 2.41 with GFR 23, albumin 1.9, ammonia 52, lactic acid 6.2, elevated white count 40.9 with hemoglobin 7.2.  Respiratory panel negative for influenza, COVID-19, and RSV.  UA appears hazy with glucose.  Blood cultures pending.  Urine culture pending.  Chest x-ray shows mild central vascular congestion.  BNP 398.  Sodium has fluctuated between 115 and 120.  3% hypertonic saline initially started in ED, now has stopped.  Potassium currently 6.6.   Medications: Outpatient medications: Medications Prior to Admission  Medication Sig Dispense Refill Last Dose/Taking   aspirin 81 MG tablet Take 81 mg by mouth daily.   01/25/2024   Baclofen 5 MG TABS Take 2 tablets by mouth at bedtime.   01/25/2024   buPROPion (WELLBUTRIN XL) 150 MG 24 hr tablet Take 1 tablet (150 mg total) by mouth daily. 30 tablet 3 01/25/2024   canagliflozin (INVOKANA) 300 MG TABS  tablet Take by mouth.   01/25/2024   carvedilol (COREG) 6.25 MG tablet Take 6.25 mg by mouth 2 (two) times daily.   01/25/2024   citalopram (CELEXA) 20 MG tablet Take 20 mg by mouth daily.   01/25/2024   Continuous Glucose Sensor (FREESTYLE LIBRE 3 SENSOR) MISC by Does not apply route daily.   01/26/2024   COSENTYX SENSOREADY, 300 MG, 150 MG/ML SOAJ Inject into the skin.   Taking   Ferrous Sulfate (SLOW FE) 142 (45 Fe) MG TBCR Take by mouth.   01/25/2024   furosemide (LASIX) 40 MG tablet Take 40 mg by mouth 2 (two) times daily.   01/25/2024   gabapentin (NEURONTIN) 300 MG capsule Take by mouth once.   01/25/2024   insulin lispro (HUMALOG) 100 UNIT/ML KwikPen Inject into the skin.   01/25/2024   lactulose (CHRONULAC) 10 GM/15ML solution SMARTSIG:Milliliter(s) By Mouth   Past Week   LANTUS SOLOSTAR 100 UNIT/ML Solostar Pen Inject 66 Units into the skin daily.   01/25/2024   levETIRAcetam (KEPPRA) 500 MG tablet Take 750 mg by mouth 2 (two) times daily.   Taking   MAGNESIUM-OXIDE 400 (240 Mg) MG tablet Take 2 tablets by mouth 2 (two) times daily.   01/25/2024   metFORMIN (GLUCOPHAGE-XR) 500 MG 24 hr tablet Take  1,000 mg by mouth 2 (two) times daily.   01/25/2024   mirtazapine (REMERON) 15 MG tablet Take 1 tablet (15 mg total) by mouth at bedtime. 30 tablet 0 01/25/2024   ondansetron (ZOFRAN-ODT) 4 MG disintegrating tablet Dissolve 1 tablet (4 mg total) in the mouth every twelve (12) hours as needed for nausea.   Past Week   pantoprazole (PROTONIX) 40 MG tablet TAKE ONE TABLET BY MOUTH EVERY DAY 90 tablet 0 01/25/2024   promethazine (PHENERGAN) 12.5 MG tablet Take by mouth.   Taking   scopolamine (TRANSDERM-SCOP) 1 MG/3DAYS Place 1 patch onto the skin every 3 (three) days.   Taking   spironolactone (ALDACTONE) 100 MG tablet Take 100 mg by mouth 2 (two) times daily.   01/25/2024   XIFAXAN 550 MG TABS tablet Take 550 mg by mouth 2 (two) times daily.   01/25/2024   zolpidem (AMBIEN) 5 MG tablet Take 1 tablet (5 mg  total) by mouth at bedtime as needed for sleep. 30 tablet 1 Past Week   Accu-Chek Softclix Lancets lancets SMARTSIG:Topical      BD PEN NEEDLE NANO 2ND GEN 32G X 4 MM MISC USE 1 NIGHTLY      chlorhexidine (PERIDEX) 0.12 % solution SMARTSIG:0.5 Capful(s) By Mouth Morning-Evening (Patient not taking: Reported on 01/26/2024)   Not Taking   clotrimazole (MYCELEX) 10 MG troche Take 10 mg by mouth 5 (five) times daily. (Patient not taking: Reported on 01/26/2024)   Not Taking   Continuous Glucose Receiver (DEXCOM G7 RECEIVER) DEVI USE AS INSTRUCTED WITH G7 SENSORS (Patient not taking: Reported on 01/26/2024)   Not Taking   Continuous Glucose Sensor (DEXCOM G7 SENSOR) MISC USE TO MONITOR BLOOD GLUCOSE LEVELS CONTINUOUSLY. CHANGE SENSOR EVERY 10 DAYS. (Patient not taking: Reported on 01/26/2024)   Not Taking   Dulaglutide (TRULICITY) 1.5 MG/0.5ML SOPN Inject 1.5 mg into the skin once a week. Thursdays (Patient not taking: Reported on 01/26/2024)   Not Taking   insulin glargine-yfgn (SEMGLEE, YFGN,) 100 UNIT/ML Pen INJECT 50 UNITS UNDER THE SKIN NIGHTLY AS DIRECTED BY YOUR DOCTOR AROUND SAME TIME EVERY DAY. TOTAL DAILY DOSE NOT TO EXCEED 50 UNITS (Patient not taking: Reported on 01/26/2024)   Not Taking   Insulin Pen Needle 32G X 6 MM MISC 1 Syringe by Does not apply route. Use with Victoza.      naltrexone (DEPADE) 50 MG tablet Take 50 mg by mouth daily.      Secukinumab (COSENTYX SENSOREADY PEN) 150 MG/ML SOAJ Inject the contents of 2 pens (300 mg total) under the skin once a week at weeks 0, 1, 2, 3, and 4. THEN inject the contents of 2 pens (300 mg total) every 4 weeks thereafter. (Patient not taking: Reported on 01/26/2024)   Not Taking   Sharps Container (BD SHARPS COLLECTOR) MISC Use as directed to dispose of Cosentyx pens.      simvastatin (ZOCOR) 20 MG tablet TAKE ONE TABLET BY MOUTH EVERY EVENING 90 tablet 0     Current medications: Current Facility-Administered Medications  Medication Dose Route  Frequency Provider Last Rate Last Admin   albuterol (PROVENTIL) (2.5 MG/3ML) 0.083% nebulizer solution 10 mg  10 mg Nebulization Once Ezequiel Essex, NP       amiodarone (NEXTERONE PREMIX) 360-4.14 MG/200ML-% (1.8 mg/mL) IV infusion  60 mg/hr Intravenous Continuous Willy Eddy, MD       Followed by   amiodarone (NEXTERONE PREMIX) 360-4.14 MG/200ML-% (1.8 mg/mL) IV infusion  30 mg/hr Intravenous Continuous Willy Eddy,  MD       atropine 1 MG/10ML injection            [START ON 01/27/2024] azithromycin (ZITHROMAX) 500 mg in sodium chloride 0.9 % 250 mL IVPB  500 mg Intravenous Q24H Ezequiel Essex, NP       calcium gluconate 1 g/ 50 mL sodium chloride IVPB  1 g Intravenous Once Ezequiel Essex, NP       Melene Muller ON 01/27/2024] cefTRIAXone (ROCEPHIN) 2 g in sodium chloride 0.9 % 100 mL IVPB  2 g Intravenous Q24H Ezequiel Essex, NP       insulin aspart (novoLOG) injection 10 Units  10 Units Intravenous Once Ezequiel Essex, NP       And   dextrose 50 % solution 50 mL  1 ampule Intravenous Once Ezequiel Essex, NP       docusate (COLACE) 50 MG/5ML liquid 100 mg  100 mg Per Tube BID PRN Ezequiel Essex, NP       docusate (COLACE) 50 MG/5ML liquid 100 mg  100 mg Per Tube BID Ezequiel Essex, NP       fentaNYL in NS (24mcg/ml) infusion-PREMIX  0-400 mcg/hr Intravenous Continuous Willy Eddy, MD   Stopped at 01/26/24 1150   heparin injection 5,000 Units  5,000 Units Subcutaneous Q8H Ezequiel Essex, NP       norepinephrine (LEVOPHED) 16 mg in (0.064 mg/mL) premix infusion  0-40 mcg/min Intravenous Continuous Ezequiel Essex, NP 6.56 mL/hr at 01/26/24 1333 7 mcg/min at 01/26/24 1333   phenylephrine CONCENTRATED 100mg  in sodium chloride 0.9% (0.4mg /mL) premix infusion  0-200 mcg/min Intravenous Titrated Ezequiel Essex, NP 3 mL/hr at 01/26/24 1320 20 mcg/min at 01/26/24 1320   polyethylene glycol (MIRALAX / GLYCOLAX) packet 17 g  17 g Per Tube Daily PRN Ezequiel Essex, NP        polyethylene glycol (MIRALAX / GLYCOLAX) packet 17 g  17 g Per Tube Daily Ezequiel Essex, NP       sodium bicarbonate 150 mEq in sterile water 1,150 mL infusion   Intravenous Continuous Ezequiel Essex, NP       sodium zirconium cyclosilicate (LOKELMA) packet 10 g  10 g Per Tube STAT Ezequiel Essex, NP          Allergies: No Known Allergies    Past Medical History: Past Medical History:  Diagnosis Date   Anemia    Anxiety    Arthritis    Cancer (HCC)    Endometrial cancer   Chronic pain    Cirrhosis of liver (HCC)    Depression    Diabetes mellitus without complication (HCC)    Fatty liver disease, nonalcoholic 2021   GERD (gastroesophageal reflux disease)    Glaucoma    Hyperlipidemia    Hypertension    Meningioma (HCC)    OSA (obstructive sleep apnea) 05/08/2023   Overactive bladder    Pharmacologic therapy 11/02/2023   Psoriasis 2015     Past Surgical History: Past Surgical History:  Procedure Laterality Date   ABDOMINAL HYSTERECTOMY  2007   CHOLECYSTECTOMY  2000     Family History: Family History  Problem Relation Age of Onset   Diabetes Mother    Hypertension Mother    Depression Mother    Obesity Mother    Vision loss Mother    Alcohol abuse Cousin    Bipolar disorder Cousin    Heart disease Maternal Grandfather  Alcohol abuse Maternal Grandmother    Stroke Paternal Grandfather      Social History: Social History   Socioeconomic History   Marital status: Widowed    Spouse name: Not on file   Number of children: Not on file   Years of education: Not on file   Highest education level: Associate degree: academic program  Occupational History   Not on file  Tobacco Use   Smoking status: Never   Smokeless tobacco: Never  Vaping Use   Vaping status: Never Used  Substance and Sexual Activity   Alcohol use: Not Currently    Comment:  Rarely. One or two drinks a year    Drug use: No   Sexual activity: Not Currently    Birth  control/protection: Abstinence  Other Topics Concern   Not on file  Social History Narrative   PT is not working right now. Pt worries about financial needs since her husband died in 2024-05-08 and she has no income. She applied for Widow's benefits in July but has not heard any information and the application is still pending. If possible please inquire about application status.     Social Drivers of Corporate investment banker Strain: Low Risk  (12/16/2023)   Received from [facility]   Overall Financial Resource Strain (CARDIA)    Difficulty of Paying Living Expenses: Not very hard  Food Insecurity: No Food Insecurity (12/16/2023)   Received from [facility]   Hunger Vital Sign    Worried About Running Out of Food in the Last Year: Never true    Ran Out of Food in the Last Year: Never true  Transportation Needs: No Transportation Needs (12/16/2023)   Received from [facility] - Transportation    Lack of Transportation (Medical): No    Lack of Transportation (Non-Medical): No  Physical Activity: Insufficiently Active (01/09/2021)   Received from [facility], [facility]   Exercise Vital Sign    Days of Exercise per Week: 1 day    Minutes of Exercise per Session: 40 min  Stress: Stress Concern Present (09/17/2022)   Received from [facility], [facility] - Occupational Stress Questionnaire    Feeling of Stress : Very much  Social Connections: Moderately Isolated (09/02/2018)   Social Connection and Isolation Panel [NHANES]    Frequency of Communication with Friends and Family: Once a week    Frequency of Social Gatherings with Friends and Family: Twice a week    Attends Religious Services: Never    Database administrator or Organizations: No    Attends Banker Meetings: Never    Marital Status: Widowed  Intimate Partner Violence: Not At Risk (01/09/2021)   Received from [facility], [facility]   Humiliation, Afraid, Rape, and Kick questionnaire    Fear of Current or Ex-Partner: No    Emotionally Abused: No    Physically Abused: No    Sexually Abused: No     Review of Systems: Review of Systems  Unable to perform ROS: Critical illness    Vital Signs: Blood pressure 93/71, pulse 91, temperature 97.9 F (36.6 C), temperature source Oral, resp. rate 15, last menstrual period 10/15/2016, SpO2 97%.  Weight trends: There were no vitals filed for this visit.  Physical Exam: General: Ill-appearing  Head: Normocephalic, atraumatic.   Eyes: Anicteric  Lungs:  Intubated with vent support  Heart: Regular rate and rhythm  Abdomen:  Soft, nontender,   Extremities: No peripheral edema.  Neurologic: Sedated  Skin: No lesions  Access: None     Lab results: Basic Metabolic Panel: Recent Labs  Lab 01/26/24 0834 01/26/24 0950 01/26/24 1144 01/26/24 1153 01/26/24 1213  NA 115* 117* 120*  --  119*  K 6.2*  --   --   --  6.6*  CL 86*  --   --   --  89*  CO2 17*  --   --   --  17*  GLUCOSE 126*  --   --   --  297*  BUN 67*  --   --   --  61*  CREATININE 2.41*  --   --   --  2.05*  CALCIUM 8.6*  --   --   --  8.3*  MG  --   --   --  2.1  --   PHOS  --   --   --  5.1*  --     Liver Function Tests: Recent Labs  Lab 01/26/24 0834  AST 71*  ALT 26  ALKPHOS 204*  BILITOT 5.1*  PROT 6.4*  ALBUMIN 1.9*   No results for input(s): "LIPASE", "AMYLASE" in the last 168 hours. Recent Labs  Lab 01/26/24 0834 01/26/24 1213  AMMONIA 52* 56*    CBC: Recent Labs  Lab 01/26/24 0834  WBC 40.9*  NEUTROABS 36.1*  HGB 7.2*  HCT 21.9*  MCV 80.8  PLT 185    Cardiac Enzymes: No results for input(s): "CKTOTAL", "CKMB", "CKMBINDEX", "TROPONINI" in the last 168 hours.  BNP: Invalid input(s): "POCBNP"  CBG: Recent Labs  Lab 01/26/24 1139  GLUCAP 169*    Microbiology: Results for orders placed or performed during the hospital encounter of 01/26/24  Resp  panel by RT-PCR (RSV, Flu A&B, Covid) Anterior Nasal Swab     Status: None   Collection Time: 01/26/24  8:34 AM   Specimen: Anterior Nasal Swab  Result Value Ref Range Status   SARS Coronavirus 2 by RT PCR NEGATIVE NEGATIVE Final    Comment: (NOTE) SARS-CoV-2 target nucleic acids are NOT DETECTED.  The SARS-CoV-2 RNA is generally detectable in upper respiratory specimens during the acute phase of infection. The lowest concentration of SARS-CoV-2 viral copies this assay can detect is 138 copies/mL. A negative result does not preclude SARS-Cov-2 infection and should not be used as the sole basis for treatment or other patient management decisions. A negative result may occur with  improper specimen collection/handling, submission of specimen other than nasopharyngeal swab, presence of viral mutation(s) within the areas targeted by this assay, and inadequate number of viral copies(<138 copies/mL). A negative result must be combined with clinical observations, patient history, and epidemiological information. The expected result is Negative.  Fact Sheet for Patients:  BloggerCourse.com  Fact Sheet for Healthcare Providers:  SeriousBroker.it  This test is no t yet approved or cleared by the [place] FDA and  has been authorized for detection and/or diagnosis of SARS-CoV-2 by FDA under an Emergency Use Authorization (EUA). This EUA will remain  in effect (meaning this test can be used) for the duration of the COVID-19 declaration under Section 564(b)(1) of the Act, 21 U.S.C.section 360bbb-3(b)(1), unless the authorization is terminated  or revoked sooner.       Influenza A by PCR NEGATIVE NEGATIVE Final   Influenza B by PCR NEGATIVE NEGATIVE Final    Comment: (NOTE) The Xpert Xpress  SARS-CoV-2/FLU/RSV plus assay is intended as an aid in the diagnosis of influenza from Nasopharyngeal swab specimens and should not be used as a  sole basis for treatment. Nasal washings and aspirates are unacceptable for Xpert Xpress SARS-CoV-2/FLU/RSV testing.  Fact Sheet for Patients: BloggerCourse.com  Fact Sheet for Healthcare Providers: SeriousBroker.it  This test is not yet approved or cleared by the [place] FDA and has been authorized for detection and/or diagnosis of SARS-CoV-2 by FDA under an Emergency Use Authorization (EUA). This EUA will remain in effect (meaning this test can be used) for the duration of the COVID-19 declaration under Section 564(b)(1) of the Act, 21 U.S.C. section 360bbb-3(b)(1), unless the authorization is terminated or revoked.     Resp Syncytial Virus by PCR NEGATIVE NEGATIVE Final    Comment: (NOTE) Fact Sheet for Patients: BloggerCourse.com  Fact Sheet for Healthcare Providers: SeriousBroker.it  This test is not yet approved or cleared by the [place] FDA and has been authorized for detection and/or diagnosis of SARS-CoV-2 by FDA under an Emergency Use Authorization (EUA). This EUA will remain in effect (meaning this test can be used) for the duration of the COVID-19 declaration under Section 564(b)(1) of the Act, 21 U.S.C. section 360bbb-3(b)(1), unless the authorization is terminated or revoked.  Performed at [facility], [address] [place], [place] [address]   MRSA Next Gen by PCR, Nasal     Status: Abnormal   Collection Time: 01/26/24 11:42 AM   Specimen: Nasal Mucosa; Nasal Swab  Result Value Ref Range Status   MRSA by PCR Next Gen DETECTED (A) NOT DETECTED Final    Comment: RESULT CALLED TO, READ BACK BY AND VERIFIED WITH: Claudette Stapler 01/26/24 1315 MW (NOTE) The GeneXpert MRSA Assay (FDA approved for NASAL specimens only), is one component of a comprehensive MRSA colonization surveillance program. It is not intended to diagnose MRSA infection nor  to guide or monitor treatment for MRSA infections. Test performance is not FDA approved in patients less than 61 years old. Performed at [facility], [address] [place], [place] [address]     Coagulation Studies: Recent Labs    01/26/24 9147  LABPROT 18.8*  INR 1.6*    Urinalysis: Recent Labs    01/26/24 0834  COLORURINE YELLOW*  LABSPEC 1.013  PHURINE 6.0  GLUCOSEU >=500*  HGBUR MODERATE*  BILIRUBINUR NEGATIVE  KETONESUR NEGATIVE  PROTEINUR NEGATIVE  NITRITE NEGATIVE  LEUKOCYTESUR SMALL*      Imaging: DG Abdomen 1 View Result Date: 01/26/2024 CLINICAL DATA:  post og tube placement EXAM: ABDOMEN - 1 VIEW COMPARISON:  Chest XR and CT AP, concurrent. FINDINGS: Support lines: Enteric decompression tube, with tube tip and side port within stomach. The imaged bowel gas pattern is normal. Cholecystectomy clips. No radio-opaque calculi or other significant radiographic abnormality. IMPRESSION: 1. NG tube, well-positioned with tip and side port within the stomach. 2. Nonobstructed bowel-gas pattern. Electronically Signed   By: Roanna Banning M.D.   On: 01/26/2024 11:44   DG Chest Portable 1 View Result Date: 01/26/2024 CLINICAL DATA:  post intubation EXAM: PORTABLE CHEST 1 VIEW COMPARISON:  Chest XR, 01/26/2024.  CT AP, concurrent. FINDINGS: Support lines: Intubation with ETT within the distal thoracic trachea, 3 cm from carina. Enteric decompression tube with tip extending outside field. Cardiomegaly. Aortic arch atherosclerosis. Mediastinum is within normal limits given technique and patient rotation. The RIGHT lung is clear. Patchy retrocardiac opacity. No large pleural effusion or pneumothorax. No acute displaced fracture. IMPRESSION: 1.  Intubation with ETT well-positioned at the distal thoracic trachea. Additional lines and tubes, as above. 2. Cardiomegaly and Aortic Atherosclerosis (ICD10-I70.0). 3. Retrocardiac opacity is likely to represent atelectasis, however  early pneumonia can appear similar. Electronically Signed   By: Roanna Banning M.D.   On: 01/26/2024 11:41   CT ABDOMEN PELVIS WO CONTRAST Result Date: 01/26/2024 CLINICAL DATA:  Sepsis EXAM: CT ABDOMEN AND PELVIS WITHOUT CONTRAST TECHNIQUE: Multidetector CT imaging of the abdomen and pelvis was performed following the standard protocol without IV contrast. RADIATION DOSE REDUCTION: This exam was performed according to the departmental dose-optimization program which includes automated exposure control, adjustment of the mA and/or kV according to patient size and/or use of iterative reconstruction technique. COMPARISON:  CT AP, 11/12/2012 and 08/10/2008. Chest XR and KUB, concurrent. FINDINGS: Lower chest: Trace LEFT pleural effusion with adjacent dependent patchy pulmonary consolidation. Hepatobiliary: Gross nodular contour liver. No focal abnormality. Cholecystectomy. No biliary dilatation. Pancreas: No pancreatic ductal dilatation or surrounding inflammatory changes. Spleen: Splenomegaly, measuring up to 17.0 cm Adrenals/Urinary Tract: Adrenal glands are unremarkable. Kidneys are normal, without renal calculi, focal lesion, or hydronephrosis. Bladder is decompressed by Foley catheter. Stomach/Bowel: Stomach is within normal limits. Enteric decompression tube, with tip within the gastric body. Appendix appears normal. No evidence of bowel wall thickening, distention, or inflammatory changes. Vascular/Lymphatic: Aortic atherosclerosis without aneurysmal dilatation. No enlarged abdominal or pelvic lymph nodes. Reproductive: Hysterectomy.  No adnexal mass. Other: No abdominal wall hernia or abnormality. Trace mesenteric stranding. Small volume perihepatic, perisplenic, LEFT lower quadrant and deep pelvic ascites. Musculoskeletal: Mild body wall edema. No acute or significant osseous findings. IMPRESSION: Suboptimal evaluation, secondary to a lack of intravenous contrast. 1. No acute abdominopelvic process. 2.  Cirrhosis with portal hypertension, as evidenced by splenomegaly. Small volume of abdominopelvic ascites. 3. Trace LEFT pleural effusion, with adjacent pulmonary consolidation. This may represent atelectasis, though early pneumonia can appear similar. Electronically Signed   By: Roanna Banning M.D.   On: 01/26/2024 11:20   CT HEAD WO CONTRAST ( ) Result Date: 01/26/2024 CLINICAL DATA:  Mental status change, unknown cause; Neck trauma, focal neuro deficit or paresthesia (Age 69-64y) EXAM: CT HEAD WITHOUT CONTRAST CT CERVICAL SPINE WITHOUT CONTRAST TECHNIQUE: Multidetector CT imaging of the head and cervical spine was performed following the standard protocol without intravenous contrast. Multiplanar CT image reconstructions of the cervical spine were also generated. RADIATION DOSE REDUCTION: This exam was performed according to the departmental dose-optimization program which includes automated exposure control, adjustment of the mA and/or kV according to patient size and/or use of iterative reconstruction technique. COMPARISON:  None Available. FINDINGS: CT HEAD FINDINGS Brain: No hemorrhage. No hydrocephalus. No extra-axial fluid collection. No mass effect. No mass lesion. 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 Vascular: No hyperdense vessel or unexpected calcification. Skull: Soft tissue swelling along the left frontal scalp. No evidence of an underlying calvarial fracture. Sinuses/Orbits: No middle ear or mastoid effusion. Paranasal sinuses are clear. Orbits are unremarkable Other: None. CT CERVICAL SPINE FINDINGS Alignment: Normal. Skull base and vertebrae: No acute fracture. No primary bone lesion or focal pathologic process. Soft tissues and spinal canal: No prevertebral fluid or swelling. No visible canal hematoma. Disc levels:  CT evidence of high-grade spinal canal stenosis Upper chest: Negative. Other: Partially imaged endotracheal and enteric tubes in place. IMPRESSION: 1. No CT  evidence of intracranial injury. 2. 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 If there is clinical concern for an acute infarct, consider further evaluation with MRI. 3. Soft tissue  swelling along the left frontal scalp. No evidence of an underlying calvarial fracture. 4. No acute fracture or traumatic subluxation of the cervical spine. Electronically Signed   By: Lorenza Cambridge M.D.   On: 01/26/2024 10:46   CT Cervical Spine Wo Contrast Result Date: 01/26/2024 CLINICAL DATA:  Mental status change, unknown cause; Neck trauma, focal neuro deficit or paresthesia (Age 47-64y) EXAM: CT HEAD WITHOUT CONTRAST CT CERVICAL SPINE WITHOUT CONTRAST TECHNIQUE: Multidetector CT imaging of the head and cervical spine was performed following the standard protocol without intravenous contrast. Multiplanar CT image reconstructions of the cervical spine were also generated. RADIATION DOSE REDUCTION: This exam was performed according to the departmental dose-optimization program which includes automated exposure control, adjustment of the mA and/or kV according to patient size and/or use of iterative reconstruction technique. COMPARISON:  None Available. FINDINGS: CT HEAD FINDINGS Brain: No hemorrhage. No hydrocephalus. No extra-axial fluid collection. No mass effect. No mass lesion. 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 Vascular: No hyperdense vessel or unexpected calcification. Skull: Soft tissue swelling along the left frontal scalp. No evidence of an underlying calvarial fracture. Sinuses/Orbits: No middle ear or mastoid effusion. Paranasal sinuses are clear. Orbits are unremarkable Other: None. CT CERVICAL SPINE FINDINGS Alignment: Normal. Skull base and vertebrae: No acute fracture. No primary bone lesion or focal pathologic process. Soft tissues and spinal canal: No prevertebral fluid or swelling. No visible canal hematoma. Disc levels:  CT evidence of high-grade spinal canal stenosis Upper chest: Negative.  Other: Partially imaged endotracheal and enteric tubes in place. IMPRESSION: 1. No CT evidence of intracranial injury. 2. 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 If there is clinical concern for an acute infarct, consider further evaluation with MRI. 3. Soft tissue swelling along the left frontal scalp. No evidence of an underlying calvarial fracture. 4. No acute fracture or traumatic subluxation of the cervical spine. Electronically Signed   By: Lorenza Cambridge M.D.   On: 01/26/2024 10:46   DG Chest Port 1 View Result Date: 01/26/2024 CLINICAL DATA:  Sepsis. EXAM: PORTABLE CHEST 1 VIEW COMPARISON:  Chest radiograph dated 07/20/2021. FINDINGS: Shallow inspiration with minimal bibasilar atelectasis. No focal consolidation, pleural effusion, or pneumothorax. Mild cardiomegaly with mild central vascular congestion. No acute osseous pathology. IMPRESSION: Mild cardiomegaly with mild central vascular congestion. No focal consolidation. Electronically Signed   By: Elgie Collard M.D.   On: 01/26/2024 10:14     Assessment & Plan: Ms. NYASHIA RANEY is a 58 y.o.  female with past medical history anemia, depression, diabetes, liver cirrhosis, GERD, hypertension, OSA, who was admitted to [facility] on 01/26/2024 for Acute hyponatremia [E87.1] Shock (HCC) [R57.9] Ventricular tachycardia (HCC) [I47.20] Septic shock (HCC) [A41.9, R65.21] Encephalopathy, unspecified type [G93.40]   Severe hyponatremia, sodium on ED arrival 115. Urine osm 349.  Urine sodium less than 10.  Patient initially received 3% hypertonic saline.  Stopped when sodium went from 1 15-1 20 in 3 hours.  Liver cirrhosis history, would avoid tolvaptan.  Receiving IV fluids.  2.  Acute kidney injury with hyperkalemia likely secondary to hypotension and severe illness.  Currently receiving pressors.  CT abdomen pelvis negative for obstruction.  Normal renal function noted in December.  Foley catheter in place with decent urine output.  Potassium  increased, 6.6.  Lokelma and temporizing measures ordered by primary team.  No acute indication for dialysis at this time however monitoring very closely.  Low threshold for initiation.  3.  Acute metabolic acidosis.  Serum bicarb has decreased  to 17.  Primary team has ordered sodium bicarb infusion.   LOS: 0 Devan Danzer 2/25/20251:35 PM

## 2024-01-27 DIAGNOSIS — R6521 Severe sepsis with septic shock: Secondary | ICD-10-CM

## 2024-01-27 DIAGNOSIS — K7469 Other cirrhosis of liver: Secondary | ICD-10-CM | POA: Diagnosis not present

## 2024-01-27 DIAGNOSIS — E871 Hypo-osmolality and hyponatremia: Secondary | ICD-10-CM | POA: Diagnosis not present

## 2024-01-27 DIAGNOSIS — K766 Portal hypertension: Secondary | ICD-10-CM

## 2024-01-27 DIAGNOSIS — G929 Unspecified toxic encephalopathy: Secondary | ICD-10-CM | POA: Diagnosis not present

## 2024-01-27 DIAGNOSIS — N179 Acute kidney failure, unspecified: Secondary | ICD-10-CM | POA: Diagnosis not present

## 2024-01-27 DIAGNOSIS — B961 Klebsiella pneumoniae [K. pneumoniae] as the cause of diseases classified elsewhere: Secondary | ICD-10-CM | POA: Diagnosis not present

## 2024-01-27 DIAGNOSIS — A419 Sepsis, unspecified organism: Secondary | ICD-10-CM | POA: Diagnosis not present

## 2024-01-27 LAB — BLOOD GAS, ARTERIAL
Acid-base deficit: 1.3 mmol/L (ref 0.0–2.0)
Bicarbonate: 23.7 mmol/L (ref 20.0–28.0)
FIO2: 40 %
MECHVT: 450 mL
Mechanical Rate: 15
O2 Saturation: 99.4 %
PEEP: 8 cmH2O
Patient temperature: 37
pCO2 arterial: 40 mm[Hg] (ref 32–48)
pH, Arterial: 7.38 (ref 7.35–7.45)
pO2, Arterial: 113 mm[Hg] — ABNORMAL HIGH (ref 83–108)

## 2024-01-27 LAB — BLOOD CULTURE ID PANEL (REFLEXED) - BCID2
A.calcoaceticus-baumannii: NOT DETECTED
A.calcoaceticus-baumannii: NOT DETECTED
Bacteroides fragilis: NOT DETECTED
Bacteroides fragilis: NOT DETECTED
CTX-M ESBL: NOT DETECTED
Candida albicans: NOT DETECTED
Candida albicans: NOT DETECTED
Candida auris: NOT DETECTED
Candida auris: NOT DETECTED
Candida glabrata: NOT DETECTED
Candida glabrata: NOT DETECTED
Candida krusei: NOT DETECTED
Candida krusei: NOT DETECTED
Candida parapsilosis: NOT DETECTED
Candida parapsilosis: NOT DETECTED
Candida tropicalis: NOT DETECTED
Candida tropicalis: NOT DETECTED
Carbapenem resist OXA 48 LIKE: NOT DETECTED
Carbapenem resistance IMP: NOT DETECTED
Carbapenem resistance KPC: NOT DETECTED
Carbapenem resistance NDM: NOT DETECTED
Carbapenem resistance VIM: NOT DETECTED
Cryptococcus neoformans/gattii: NOT DETECTED
Cryptococcus neoformans/gattii: NOT DETECTED
Enterobacter cloacae complex: NOT DETECTED
Enterobacter cloacae complex: NOT DETECTED
Enterobacterales: DETECTED — AB
Enterobacterales: NOT DETECTED
Enterococcus Faecium: NOT DETECTED
Enterococcus Faecium: NOT DETECTED
Enterococcus faecalis: NOT DETECTED
Enterococcus faecalis: NOT DETECTED
Escherichia coli: NOT DETECTED
Escherichia coli: NOT DETECTED
Haemophilus influenzae: NOT DETECTED
Haemophilus influenzae: NOT DETECTED
Klebsiella aerogenes: NOT DETECTED
Klebsiella aerogenes: NOT DETECTED
Klebsiella oxytoca: DETECTED — AB
Klebsiella oxytoca: NOT DETECTED
Klebsiella pneumoniae: NOT DETECTED
Klebsiella pneumoniae: NOT DETECTED
Listeria monocytogenes: NOT DETECTED
Listeria monocytogenes: NOT DETECTED
Methicillin resistance mecA/C: DETECTED — AB
Neisseria meningitidis: NOT DETECTED
Neisseria meningitidis: NOT DETECTED
Proteus species: NOT DETECTED
Proteus species: NOT DETECTED
Pseudomonas aeruginosa: NOT DETECTED
Pseudomonas aeruginosa: NOT DETECTED
Salmonella species: NOT DETECTED
Salmonella species: NOT DETECTED
Serratia marcescens: NOT DETECTED
Serratia marcescens: NOT DETECTED
Staphylococcus aureus (BCID): NOT DETECTED
Staphylococcus aureus (BCID): NOT DETECTED
Staphylococcus epidermidis: NOT DETECTED
Staphylococcus epidermidis: DETECTED — AB
Staphylococcus lugdunensis: NOT DETECTED
Staphylococcus lugdunensis: NOT DETECTED
Staphylococcus species: NOT DETECTED
Staphylococcus species: DETECTED — AB
Stenotrophomonas maltophilia: NOT DETECTED
Stenotrophomonas maltophilia: NOT DETECTED
Streptococcus agalactiae: NOT DETECTED
Streptococcus agalactiae: NOT DETECTED
Streptococcus pneumoniae: NOT DETECTED
Streptococcus pneumoniae: NOT DETECTED
Streptococcus pyogenes: NOT DETECTED
Streptococcus pyogenes: NOT DETECTED
Streptococcus species: NOT DETECTED
Streptococcus species: NOT DETECTED

## 2024-01-27 LAB — BASIC METABOLIC PANEL
Anion gap: 11 (ref 5–15)
Anion gap: 13 (ref 5–15)
BUN: 54 mg/dL — ABNORMAL HIGH (ref 6–20)
BUN: 57 mg/dL — ABNORMAL HIGH (ref 6–20)
CO2: 19 mmol/L — ABNORMAL LOW (ref 22–32)
CO2: 21 mmol/L — ABNORMAL LOW (ref 22–32)
Calcium: 8.9 mg/dL (ref 8.9–10.3)
Calcium: 8.9 mg/dL (ref 8.9–10.3)
Chloride: 90 mmol/L — ABNORMAL LOW (ref 98–111)
Chloride: 93 mmol/L — ABNORMAL LOW (ref 98–111)
Creatinine, Ser: 1.47 mg/dL — ABNORMAL HIGH (ref 0.44–1.00)
Creatinine, Ser: 1.69 mg/dL — ABNORMAL HIGH (ref 0.44–1.00)
GFR, Estimated: 35 mL/min — ABNORMAL LOW (ref >60)
GFR, Estimated: 41 mL/min — ABNORMAL LOW (ref >60)
Glucose, Bld: 151 mg/dL — ABNORMAL HIGH (ref 70–99)
Glucose, Bld: 327 mg/dL — ABNORMAL HIGH (ref 70–99)
Potassium: 4.7 mmol/L (ref 3.5–5.1)
Potassium: 5.4 mmol/L — ABNORMAL HIGH (ref 3.5–5.1)
Sodium: 122 mmol/L — ABNORMAL LOW (ref 135–145)
Sodium: 125 mmol/L — ABNORMAL LOW (ref 135–145)

## 2024-01-27 LAB — RESPIRATORY PANEL BY PCR

## 2024-01-27 LAB — GLUCOSE, CAPILLARY
Glucose-Capillary: 136 mg/dL — ABNORMAL HIGH (ref 70–99)
Glucose-Capillary: 139 mg/dL — ABNORMAL HIGH (ref 70–99)
Glucose-Capillary: 143 mg/dL — ABNORMAL HIGH (ref 70–99)
Glucose-Capillary: 144 mg/dL — ABNORMAL HIGH (ref 70–99)
Glucose-Capillary: 157 mg/dL — ABNORMAL HIGH (ref 70–99)
Glucose-Capillary: 184 mg/dL — ABNORMAL HIGH (ref 70–99)
Glucose-Capillary: 209 mg/dL — ABNORMAL HIGH (ref 70–99)
Glucose-Capillary: 210 mg/dL — ABNORMAL HIGH (ref 70–99)
Glucose-Capillary: 211 mg/dL — ABNORMAL HIGH (ref 70–99)
Glucose-Capillary: 220 mg/dL — ABNORMAL HIGH (ref 70–99)
Glucose-Capillary: 237 mg/dL — ABNORMAL HIGH (ref 70–99)
Glucose-Capillary: 244 mg/dL — ABNORMAL HIGH (ref 70–99)
Glucose-Capillary: 247 mg/dL — ABNORMAL HIGH (ref 70–99)
Glucose-Capillary: 267 mg/dL — ABNORMAL HIGH (ref 70–99)
Glucose-Capillary: 288 mg/dL — ABNORMAL HIGH (ref 70–99)
Glucose-Capillary: 289 mg/dL — ABNORMAL HIGH (ref 70–99)
Glucose-Capillary: 289 mg/dL — ABNORMAL HIGH (ref 70–99)

## 2024-01-27 LAB — CBC WITH DIFFERENTIAL/PLATELET
Abs Immature Granulocytes: 1.12 10*3/uL — ABNORMAL HIGH (ref 0.00–0.07)
Basophils Absolute: 0.1 10*3/uL (ref 0.0–0.1)
Basophils Relative: 0 %
Eosinophils Absolute: 0.2 10*3/uL (ref 0.0–0.5)
Eosinophils Relative: 0 %
HCT: 24.9 % — ABNORMAL LOW (ref 36.0–46.0)
Hemoglobin: 8.2 g/dL — ABNORMAL LOW (ref 12.0–15.0)
Immature Granulocytes: 2 %
Lymphocytes Relative: 3 %
Lymphs Abs: 1.6 10*3/uL (ref 0.7–4.0)
MCH: 26.8 pg (ref 26.0–34.0)
MCHC: 32.9 g/dL (ref 30.0–36.0)
MCV: 81.4 fL (ref 80.0–100.0)
Monocytes Absolute: 4.3 10*3/uL — ABNORMAL HIGH (ref 0.1–1.0)
Monocytes Relative: 9 %
Neutro Abs: 43.5 10*3/uL — ABNORMAL HIGH (ref 1.7–7.7)
Neutrophils Relative %: 86 %
Platelets: 416 10*3/uL — ABNORMAL HIGH (ref 150–400)
RBC: 3.06 MIL/uL — ABNORMAL LOW (ref 3.87–5.11)
RDW: 19.5 % — ABNORMAL HIGH (ref 11.5–15.5)
Smear Review: NORMAL
WBC: 50.9 10*3/uL (ref 4.0–10.5)
nRBC: 0.1 % (ref 0.0–0.2)

## 2024-01-27 LAB — LACTIC ACID, PLASMA
Lactic Acid, Venous: 2.4 mmol/L (ref 0.5–1.9)
Lactic Acid, Venous: 2.4 mmol/L (ref 0.5–1.9)
Lactic Acid, Venous: 3.5 mmol/L (ref 0.5–1.9)
Lactic Acid, Venous: 3.6 mmol/L (ref 0.5–1.9)

## 2024-01-27 LAB — HEPATITIS PANEL, ACUTE
HCV Ab: NONREACTIVE
Hep A IgM: NONREACTIVE
Hep B C IgM: NONREACTIVE
Hepatitis B Surface Ag: NONREACTIVE

## 2024-01-27 LAB — TROPONIN I (HIGH SENSITIVITY)
Troponin I (High Sensitivity): 2680 ng/L (ref <18)
Troponin I (High Sensitivity): 3717 ng/L (ref <18)
Troponin I (High Sensitivity): 4087 ng/L (ref <18)

## 2024-01-27 LAB — PROCALCITONIN: Procalcitonin: 11.39 ng/mL

## 2024-01-27 LAB — HEMOGLOBIN A1C
Hgb A1c MFr Bld: 8.5 % — ABNORMAL HIGH (ref 4.8–5.6)
Mean Plasma Glucose: 197 mg/dL

## 2024-01-27 LAB — HEPARIN LEVEL (UNFRACTIONATED): Heparin Unfractionated: 0.24 [IU]/mL — ABNORMAL LOW (ref 0.30–0.70)

## 2024-01-27 LAB — PHOSPHORUS: Phosphorus: 3.3 mg/dL (ref 2.5–4.6)

## 2024-01-27 LAB — MAGNESIUM: Magnesium: 2.3 mg/dL (ref 1.7–2.4)

## 2024-01-27 MED ORDER — THIAMINE HCL 100 MG PO TABS
100.0000 mg | ORAL_TABLET | Freq: Every day | ORAL | Status: DC
  Administered 2024-01-28 – 2024-02-08 (×12): 100 mg via ORAL
  Filled 2024-01-27 (×22): qty 1

## 2024-01-27 MED ORDER — ACETAMINOPHEN 325 MG PO TABS
650.0000 mg | ORAL_TABLET | Freq: Four times a day (QID) | ORAL | Status: DC | PRN
  Administered 2024-01-27: 650 mg via ORAL
  Filled 2024-01-27: qty 2

## 2024-01-27 MED ORDER — GLUCERNA SHAKE PO LIQD
237.0000 mL | Freq: Three times a day (TID) | ORAL | Status: DC
  Administered 2024-01-27 – 2024-02-07 (×32): 237 mL via ORAL

## 2024-01-27 MED ORDER — ALBUMIN HUMAN 5 % IV SOLN
25.0000 g | Freq: Once | INTRAVENOUS | Status: AC
  Administered 2024-01-27: 25 g via INTRAVENOUS
  Filled 2024-01-27: qty 500

## 2024-01-27 MED ORDER — ADULT MULTIVITAMIN W/MINERALS CH
1.0000 | ORAL_TABLET | Freq: Every day | ORAL | Status: DC
Start: 2024-01-28 — End: 2024-02-08
  Administered 2024-01-28 – 2024-02-08 (×12): 1 via ORAL
  Filled 2024-01-27 (×12): qty 1

## 2024-01-27 MED ORDER — DEXTROSE 50 % IV SOLN: 0.0000 mL | INTRAVENOUS | Status: DC | PRN

## 2024-01-27 MED ORDER — RIFAXIMIN 550 MG PO TABS
550.0000 mg | ORAL_TABLET | Freq: Two times a day (BID) | ORAL | Status: DC
  Administered 2024-01-27 – 2024-02-08 (×24): 550 mg via ORAL
  Filled 2024-01-27 (×24): qty 1

## 2024-01-27 MED ORDER — INSULIN REGULAR(HUMAN) IN NACL 100-0.9 UT/100ML-% IV SOLN
INTRAVENOUS | Status: DC
  Administered 2024-01-27: 15 [IU]/h via INTRAVENOUS
  Administered 2024-01-27: 8.5 [IU]/h via INTRAVENOUS
  Administered 2024-01-28: 9.5 [IU]/h via INTRAVENOUS
  Filled 2024-01-27 (×3): qty 100

## 2024-01-27 MED ORDER — ROSUVASTATIN CALCIUM 10 MG PO TABS
20.0000 mg | ORAL_TABLET | Freq: Every day | ORAL | Status: DC
  Administered 2024-01-27 – 2024-02-08 (×13): 20 mg via ORAL
  Filled 2024-01-27 (×13): qty 2

## 2024-01-27 MED ORDER — POLYVINYL ALCOHOL 1.4 % OP SOLN
1.0000 [drp] | OPHTHALMIC | Status: DC | PRN
  Administered 2024-01-28 – 2024-01-29 (×2): 1 [drp] via OPHTHALMIC
  Filled 2024-01-27: qty 15

## 2024-01-27 MED ORDER — GERHARDT'S BUTT CREAM
TOPICAL_CREAM | Freq: Two times a day (BID) | CUTANEOUS | Status: AC
  Filled 2024-01-27 (×3): qty 60

## 2024-01-27 MED ORDER — HYDROCORTISONE SOD SUC (PF) 100 MG IJ SOLR
50.0000 mg | Freq: Four times a day (QID) | INTRAMUSCULAR | Status: DC
  Administered 2024-01-27 – 2024-01-29 (×8): 50 mg via INTRAVENOUS
  Filled 2024-01-27 (×8): qty 2

## 2024-01-27 MED ORDER — FENTANYL CITRATE PF 50 MCG/ML IJ SOSY
25.0000 ug | PREFILLED_SYRINGE | INTRAMUSCULAR | Status: DC | PRN
  Administered 2024-02-01: 25 ug via INTRAVENOUS
  Filled 2024-01-27: qty 1

## 2024-01-27 MED ORDER — DOCUSATE SODIUM 100 MG PO CAPS: 100.0000 mg | ORAL_CAPSULE | Freq: Two times a day (BID) | ORAL | Status: DC | PRN

## 2024-01-27 MED ORDER — ASPIRIN 81 MG PO CHEW
81.0000 mg | CHEWABLE_TABLET | Freq: Every day | ORAL | Status: DC
  Administered 2024-01-27 – 2024-02-08 (×12): 81 mg via ORAL
  Filled 2024-01-27 (×13): qty 1

## 2024-01-27 MED ORDER — VITAMIN C 500 MG PO TABS
500.0000 mg | ORAL_TABLET | Freq: Two times a day (BID) | ORAL | Status: DC
  Administered 2024-01-27 – 2024-02-08 (×24): 500 mg via ORAL
  Filled 2024-01-27 (×25): qty 1

## 2024-01-27 MED ORDER — HEPARIN (PORCINE) 25000 UT/250ML-% IV SOLN
1150.0000 [IU]/h | INTRAVENOUS | Status: DC
  Administered 2024-01-27: 1000 [IU]/h via INTRAVENOUS
  Filled 2024-01-27: qty 250

## 2024-01-27 MED ORDER — ZINC SULFATE 220 (50 ZN) MG PO CAPS
220.0000 mg | ORAL_CAPSULE | Freq: Every day | ORAL | Status: DC
  Administered 2024-01-28 – 2024-02-08 (×12): 220 mg via ORAL
  Filled 2024-01-27 (×12): qty 1

## 2024-01-27 MED ORDER — LACTULOSE 10 GM/15ML PO SOLN
20.0000 g | Freq: Two times a day (BID) | ORAL | Status: DC
  Administered 2024-01-27 – 2024-02-07 (×15): 20 g via ORAL
  Filled 2024-01-27 (×18): qty 30

## 2024-01-27 MED ORDER — HEPARIN BOLUS VIA INFUSION
1100.0000 [IU] | Freq: Once | INTRAVENOUS | Status: AC
Start: 2024-01-28 — End: 2024-01-27
  Administered 2024-01-27: 1100 [IU] via INTRAVENOUS
  Filled 2024-01-27: qty 1100

## 2024-01-27 NOTE — Consult Note (Signed)
 WOC Nurse Consult Note: Reason for Consult:   Stage II sacral spine pressure injury and erythema/abrasions present on buttocks  Patient found unresponsive at home, history of DM. Intubated in the ICU Wound type: Stage 3 Pressure Injury; base of sacrum Stage 2 Pressure Injury: left buttock Irritant contact dermatitis over both buttocks  ICD-10 CM Codes for Irritant Dermatitis L24A2 - Due to fecal, urinary or dual incontinence  Pressure Injury POA: Yes Measurement: see nursing flow sheets Wound bed:all areas are clean and pink, moist Drainage (amount, consistency, odor) none Periwound:  intact  Dressing procedure/placement/frequency: Cleanse sacral and left buttock wound with saline, cover with single layer of xeroform and top with foam. If stooling frequently DC and use Gerhardt's butt cream instead Use Gerhardt's on the right buttock ICD LALM in place while in the ICU, if patient is transferred would order air mattress  Offload heels in high risk patient  Re consult if needed, will not follow at this time. Thanks  Demetruis Depaul [employer], RN,CWOCN, CNS, CWON-AP [phone])

## 2024-01-27 NOTE — Progress Notes (Signed)
 PHARMACY - PHYSICIAN COMMUNICATION CRITICAL VALUE ALERT - BLOOD CULTURE IDENTIFICATION (BCID)  Abigail Molina is an 58 y.o. female who presented to [facility] on 01/26/2024 with a chief complaint of unresponsive on couch  Assessment: previous blood cx result with K. oxytoca from 2/25 blood culture result.  Now lab reporting GPC (only one set). BCID detected MRSE  Name of physician (or Provider) Contacted: Drs Belia Heman and Jarold Song  Current antibiotics: Ceftriaxone  Changes to prescribed antibiotics recommended:  Patient is on recommended antibiotics - No changes needed - suspected contaminant at this time. Monitor culture result   Results for orders placed or performed during the hospital encounter of 01/26/24  Blood Culture ID Panel (Reflexed) (Collected: 01/26/2024  8:34 AM)  Result Value Ref Range   Enterococcus faecalis NOT DETECTED NOT DETECTED   Enterococcus Faecium NOT DETECTED NOT DETECTED   Listeria monocytogenes NOT DETECTED NOT DETECTED   Staphylococcus species DETECTED (A) NOT DETECTED   Staphylococcus aureus (BCID) NOT DETECTED NOT DETECTED   Staphylococcus epidermidis DETECTED (A) NOT DETECTED   Staphylococcus lugdunensis NOT DETECTED NOT DETECTED   Streptococcus species NOT DETECTED NOT DETECTED   Streptococcus agalactiae NOT DETECTED NOT DETECTED   Streptococcus pneumoniae NOT DETECTED NOT DETECTED   Streptococcus pyogenes NOT DETECTED NOT DETECTED   A.calcoaceticus-baumannii NOT DETECTED NOT DETECTED   Bacteroides fragilis NOT DETECTED NOT DETECTED   Enterobacterales NOT DETECTED NOT DETECTED   Enterobacter cloacae complex NOT DETECTED NOT DETECTED   Escherichia coli NOT DETECTED NOT DETECTED   Klebsiella aerogenes NOT DETECTED NOT DETECTED   Klebsiella oxytoca NOT DETECTED NOT DETECTED   Klebsiella pneumoniae NOT DETECTED NOT DETECTED   Proteus species NOT DETECTED NOT DETECTED   Salmonella species NOT DETECTED NOT DETECTED   Serratia marcescens NOT DETECTED NOT  DETECTED   Haemophilus influenzae NOT DETECTED NOT DETECTED   Neisseria meningitidis NOT DETECTED NOT DETECTED   Pseudomonas aeruginosa NOT DETECTED NOT DETECTED   Stenotrophomonas maltophilia NOT DETECTED NOT DETECTED   Candida albicans NOT DETECTED NOT DETECTED   Candida auris NOT DETECTED NOT DETECTED   Candida glabrata NOT DETECTED NOT DETECTED   Candida krusei NOT DETECTED NOT DETECTED   Candida parapsilosis NOT DETECTED NOT DETECTED   Candida tropicalis NOT DETECTED NOT DETECTED   Cryptococcus neoformans/gattii NOT DETECTED NOT DETECTED   Methicillin resistance mecA/C DETECTED (A) NOT DETECTED    Juliette Alcide, PharmD, BCPS, BCIDP Work Cell: [phone] 01/27/2024 3:25 PM

## 2024-01-27 NOTE — Plan of Care (Signed)
  Problem: Education: Goal: Knowledge of General Education information will improve Description: Including pain rating scale, medication(s)/side effects and non-pharmacologic comfort measures Outcome: Progressing   Problem: Health Behavior/Discharge Planning: Goal: Ability to manage health-related needs will improve Outcome: Progressing   Problem: Clinical Measurements: Goal: Ability to maintain clinical measurements within normal limits will improve Outcome: Progressing Goal: Diagnostic test results will improve Outcome: Progressing Goal: Respiratory complications will improve Outcome: Progressing Goal: Cardiovascular complication will be avoided Outcome: Progressing   Problem: Activity: Goal: Risk for activity intolerance will decrease Outcome: Progressing   Problem: Coping: Goal: Level of anxiety will decrease Outcome: Progressing   Problem: Elimination: Goal: Will not experience complications related to bowel motility Outcome: Progressing Goal: Will not experience complications related to urinary retention Outcome: Progressing   Problem: Pain Managment: Goal: General experience of comfort will improve and/or be controlled Outcome: Progressing   Problem: Safety: Goal: Ability to remain free from injury will improve Outcome: Progressing   Problem: Skin Integrity: Goal: Risk for impaired skin integrity will decrease Outcome: Progressing   Problem: Coping: Goal: Ability to adjust to condition or change in health will improve Outcome: Progressing   Problem: Fluid Volume: Goal: Ability to maintain a balanced intake and output will improve Outcome: Progressing   Problem: Metabolic: Goal: Ability to maintain appropriate glucose levels will improve Outcome: Progressing   Problem: Nutritional: Goal: Progress toward achieving an optimal weight will improve Outcome: Progressing   Problem: Skin Integrity: Goal: Risk for impaired skin integrity will  decrease Outcome: Progressing   Problem: Tissue Perfusion: Goal: Adequacy of tissue perfusion will improve Outcome: Progressing   Problem: Clinical Measurements: Goal: Will remain free from infection Outcome: Not Progressing   Problem: Nutrition: Goal: Adequate nutrition will be maintained Outcome: Not Progressing   Problem: Education: Goal: Ability to describe self-care measures that may prevent or decrease complications (Diabetes Survival Skills Education) will improve Outcome: Not Progressing Goal: Individualized Educational Video(s) Outcome: Not Progressing   Problem: Health Behavior/Discharge Planning: Goal: Ability to identify and utilize available resources and services will improve Outcome: Not Progressing Goal: Ability to manage health-related needs will improve Outcome: Not Progressing   Problem: Nutritional: Goal: Maintenance of adequate nutrition will improve Outcome: Not Progressing

## 2024-01-27 NOTE — Inpatient Diabetes Management (Signed)
 Inpatient Diabetes Program Recommendations  AACE/ADA: New Consensus Statement on Inpatient Glycemic Control (2015)  Target Ranges:  Prepandial:   less than 140 mg/dL      Peak postprandial:   less than 180 mg/dL (1-2 hours)      Critically ill patients:  140 - 180 mg/dL   Lab Results  Component Value Date   GLUCAP 139 (H) 01/27/2024   HGBA1C 8.5 (H) 01/26/2024    Review of Glycemic Control  Latest Reference Range & Units 01/27/24 04:33 01/27/24 05:30 01/27/24 06:55 01/27/24 08:58  Glucose-Capillary 70 - 99 mg/dL 161 (H) 096 (H) 045 (H) 139 (H)   Diabetes history: DM 2 Outpatient Diabetes medications:  Invokana 300 mg daily Freestyle libre 3 Lantus 65 units daily Metformin 1000 mg bid Current orders for Inpatient glycemic control:  IV insulin (goal 140-180 mg/dL)  Inpatient Diabetes Program Recommendations:    Agree with current orders.  When patient is ready for transition off insulin drip, consider use of Phase 3 of ICU glycemic control orders. Will follow.   Thanks,  Lorenza Cambridge, RN, BC-ADM Inpatient Diabetes Coordinator Pager [phone]  (8a-5p)

## 2024-01-27 NOTE — Progress Notes (Signed)
 NAME:  Abigail Molina, MRN:  [id number], DOB:  1966-06-07, LOS: 1 ADMISSION DATE:  01/26/2024, CONSULTATION DATE: 01/26/2024 REFERRING MD: Dr. Roxan Hockey , CHIEF COMPLAINT: Unresponsiveness    Brief Pt Description / Synopsis:  58 y.o. female admitted with Acute Metabolic Encephalopathy and Severe Sepsis with Septic Shock in the setting of Klebsiella Oxytoca Bacteremia due to UTI and suspected pneumonia, along with NSTEMI, Acute Kidney Injury and Hyponatremia. Required intubation and mechanical ventilation, now extubated.  History of Present Illness:  This is a 57 yo female who presented to [facility] ER on 02/25 via EMS from home after being found unresponsive at her home by family.  It was reported her last known well time was 2100 on 02/24.  When EMS arrived on the scene pt found to be hypotensive bp 56/29 and febrile axillary temp 100.2 F.    ED Course  In the ER pt remained hypotensive and unresponsive requiring mechanical intubation.  She met sepsis protocol and received: 3L NS bolus/azithromycin/ceftriaxone.  She remained hypotensive requiring levophed gtt.  Significant lab results were: Na+ 115/K+ 6.2/chloride 86/CO2 17/glucose 126/BUN 67/creatinine 2.41/calcium 8.6/alk phos 204/albumin 1.9/AST 71/lactic acid 6.2/wbc 40.9/hgb 7.2/PT 18.8/INR 1.6.  UA results: small leukocytes/glucose >= 500/rare bacteria.  CT Head/Cervical Spine negative for acute abnormality.  CT Abd/Pelvis negative for acute abdominopelvic process, but revealed cirrhosis with portal hypertension and splenomegaly; trace left pleural effusion; and early pneumonia vs. atelectasis.  Due to severe hyponatremia pt received a 100 ml hypertonic saline bolus.  PCCM team contacted for ICU admission.    Please see "Significant Hospital Events" section below for full detailed hospital course.   Pertinent  Medical History  Anemia  Anxiety  Arthritis  Endometrial Cancer  Chronic Pain  Cirrhosis of Liver  Depression  Type II Diabetes  Mellitus  NASH  GERD Glaucoma  HLD HTN  Meningioma OSA  Overactive Bladder  Psoriasis   Micro Data:  02/25: MRSA PCR>>positive  02/25: Covid & Resp panel by RT-PCR>>negative  02/25: Blood x2>> Klebsiella oxytoca 02/25: Urine>> 02/25: Tracheal aspirate>>  Antimicrobials:   Anti-infectives (From admission, onward)    Start     Dose/Rate Route Frequency Ordered Stop   01/27/24 1000  cefTRIAXone (ROCEPHIN) 2 g in sodium chloride 0.9 % 100 mL IVPB        2 g 200 mL/hr over 30 Minutes Intravenous Every 24 hours 01/26/24 1316     01/27/24 1000  doxycycline (VIBRAMYCIN) 100 mg in sodium chloride 0.9 % 250 mL IVPB        100 mg 125 mL/hr over 120 Minutes Intravenous Every 12 hours 01/26/24 1341     01/27/24 0845  azithromycin (ZITHROMAX) 500 mg in sodium chloride 0.9 % 250 mL IVPB  Status:  Discontinued        500 mg 250 mL/hr over 60 Minutes Intravenous Every 24 hours 01/26/24 1316 01/26/24 1341   01/26/24 1615  vancomycin (VANCOREADY) IVPB 1750 mg/350 mL        1,750 mg 175 mL/hr over 120 Minutes Intravenous  Once 01/26/24 1523 01/26/24 1928   01/26/24 1610  vancomycin variable dose per unstable renal function (pharmacist dosing)         Does not apply See admin instructions 01/26/24 1610     01/26/24 1430  rifaximin (XIFAXAN) tablet 550 mg        550 mg Per Tube 2 times daily 01/26/24 1339     01/26/24 0845  cefTRIAXone (ROCEPHIN) 2 g in sodium chloride 0.9 %  100 mL IVPB        2 g 200 mL/hr over 30 Minutes Intravenous Once 01/26/24 0830 01/26/24 0906   01/26/24 0845  azithromycin (ZITHROMAX) 500 mg in sodium chloride 0.9 % 250 mL IVPB        500 mg 250 mL/hr over 60 Minutes Intravenous  Once 01/26/24 0830 01/26/24 1012       Significant Hospital Events: Including procedures, antibiotic start and stop dates in addition to other pertinent events   02/25: Admitted with acute toxic metabolic encephalopathy, acute kidney injury, hyperkalemia, anion gap metabolic acidosis,  hypotension, intermittent wide complex tachycardia, and intermittent bradycardia requiring mechanical intubation and levophed gtt.  Nephrology consulted. 02/26: Extubated.  Remains on Levophed and Vasopressin, check CVP to assess need for further fluid requirements. Start stress dose steroids.  Blood cultures + Klebsiella Oxytoca, deescalate ABX to Ceftriaxone, consult ID. AKI and metabolic acidosis slowly improving.  Consult Cardiology due to concern for NSTEMI (HS Troponin peaked at 4087), restarting Heparin gtt.  Interim History / Subjective:  As outlined above under "Significant Hospital Events" section  Objective   Blood pressure (!) 100/52, pulse 95, temperature 97.9 F (36.6 C), temperature source Axillary, resp. rate (!) 21, height 5\' 4"  (1.626 m), weight 89.9 kg, last menstrual period 10/15/2016, SpO2 100%.    Vent Mode: PSV FiO2 (%):  [40 %] 40 % Set Rate:  [15 bmp] 15 bmp Vt Set:  [450 mL] 450 mL PEEP:  [5 cmH20-12 cmH20] 5 cmH20 Pressure Support:  [5 cmH20] 5 cmH20 Plateau Pressure:  [12 cmH20-18 cmH20] 12 cmH20   Intake/Output Summary (Last 24 hours) at 01/27/2024 1610 Last data filed at 01/27/2024 0700 Gross per 24 hour  Intake 6031.64 ml  Output 3610 ml  Net 2421.64 ml   Filed Weights   01/26/24 1445 01/27/24 0400  Weight: 88.4 kg 89.9 kg    Examination: General: Acutely on chronically-ill appearing female, just extubated, in NAD  HENT: Atraumatic, normocephalic, neck Supple, no JVD  Lungs: Clear diminished throughout, even, non labored  Cardiovascular: Regular rate and rhythm, s1s2, no m/r/g, 2+ radial/1+ distal pulses, no edema   Abdomen: +BS x4, obese mildly distended Extremities: Normal bulk and tone, no deformities Skin: Stage II sacral spine pressure ulcer with erythema with abrasions present on buttocks present on admission   Neuro: Awake and alert, oriented x3, moves all extremities to commands, no focal deficits noted, speech clear, PERRL GU: Indwelling  foley catheter draining yellow urine   Resolved Hospital Problem list     Assessment & Plan:   #Acute toxic metabolic encephalopathy  #Mechanical intubation pain/discomfort ~ NOW EXTUBATED Hx: Meningioma and seizures  -CT Head negative for acute intracranial abnormality, age indeterminate left cerebellar infarct -EEG suggestive of severe diffuse encephalopathy, no seizures -Treatment of metabolic derangements as outlined above -Provide supportive care -Promote normal sleep/wake cycle and family presence -Avoid sedating medications as able -Continue outpatient Keppra  #Septic shock  #NSTEMI #Intermittent wide complex tachycardia  #Intermittent vtach #Intermittent bradycardia  Hx: HTN and HLD  Echocardiogram 01/26/24: LVEF >55%, normal diastolic parameters, RV systolic function is normal, RV size normal -Continuous cardiac monitoring -Maintain MAP >65 -IV fluids ~ check CVP to assess need for further boluses ~ CVP is 8, hold off on further bolus -Vasopressors as needed to maintain MAP goal  -Start Stress Dose Steroids -Trend lactic acid until normalized -HS Troponin peaked at 4087 -Consult Cardiology, appreciate input ~ Recommendations: initially Heparin held yesterday due to low Hgb, but has  been stable today, will resume Heparin and monitor and treat medically -Continue Aspirin 81 mg daily, start Crestor 20 mg daily -Hold outpatient antihypertensives and diuretic therapy   #Severe Sepsis #Klebsiella Oxytoca BACTEREMIA #Suspected Pneumonia and UTI -Monitor fever curve -Trend WBC's & Procalcitonin -Follow cultures as above -Continue empiric Ceftriaxone pending cultures & sensitivities -Will consult ID to help guide further antimicrobials, appreciate input  #Acute respiratory failure  #Possible pneumonia  Hx: OSA  EXTUBATED 2/26 -Supplemental O2 as needed to maintain O2 sats >92% -BiPAP if needed -Follow intermittent Chest X-ray & ABG as needed -Bronchodilators &  Pulmicort nebs -IV Steroids -ABX as above -Pulmonary toilet as able  #Severe hyponatremia ~ IMPROVING #Acute kidney injury secondary to ATN  ~ IMPROVING #Non anion gap metabolic acidosis ~ IMPROVING #Hyperkalemia ~ RESOLVED #Lactic acidosis  -Monitor I&O's / urinary output -Follow BMP -Ensure adequate renal perfusion -Avoid nephrotoxic agents as able -Replace electrolytes as indicated ~ Pharmacy following for assistance with electrolyte replacement -Continue Bicarb gtt -Nephrology following, appreciate input ~ no indication for HD currently   #NASH cirrhosis  #Portal hypertension  Hx: Esophageal varices  - Trend hepatic function panel and coags  - Avoid hepatotoxic medications as able  - Continue outpatient lactulose and rifaximin   #Anemia with no signs of obvious bleeding  - Trend CBC  - Monitor for s/sx of bleeding  - Transfuse for hgb <7  #Type II diabetes mellitus  - CBG's q4hrs  - SSI  - Target CBG's 140 to 180's - Follow hypo/hyperglycemic protocol   #Stage II pressure ulcer present on admission  - Wound care consulted appreciate input  - Turn q2hrs     Best Practice (right click and "Reselect all SmartList Selections" daily)   Diet/type: NPO until passes bedside swallow evaluation DVT prophylaxis start Heparin gtt for ACS Pressure ulcer(s): present on admission  GI prophylaxis: PPI Lines: Central line and is still needed Foley:  Yes, and it is still needed Code Status:  full code Last date of multidisciplinary goals of care discussion [2/26]  2/26: Pt updated at bedside on plan of care  Labs   CBC: Recent Labs  Lab 01/26/24 0834 01/26/24 2038 01/27/24 0414  WBC 40.9* 58.6* 50.9*  NEUTROABS 36.1* 53.3* 43.5*  HGB 7.2* 8.0* 8.2*  HCT 21.9* 25.2* 24.9*  MCV 80.8 82.6 81.4  PLT 185 385 416*    Basic Metabolic Panel: Recent Labs  Lab 01/26/24 0834 01/26/24 0950 01/26/24 1144 01/26/24 1153 01/26/24 1213 01/26/24 1554 01/26/24 2038  01/27/24 0010 01/27/24 0414  NA 115*   < > 120*  --  119*  --  121* 122* 125*  K 6.2*  --   --   --  6.6* 6.2* 5.9* 5.4* 4.7  CL 86*  --   --   --  89*  --  90* 90* 93*  CO2 17*  --   --   --  17*  --  19* 19* 21*  GLUCOSE 126*  --   --   --  297*  --  347* 327* 151*  BUN 67*  --   --   --  61*  --  57* 57* 54*  CREATININE 2.41*  --   --   --  2.05*  --  1.78* 1.69* 1.47*  CALCIUM 8.6*  --   --   --  8.3*  --  8.9 8.9 8.9  MG  --   --   --  2.1  --   --   --   --  2.3  PHOS  --   --   --  5.1*  --   --   --   --  3.3   < > = values in this interval not displayed.   GFR: Estimated Creatinine Clearance: 44.8 mL/min (A) (by C-G formula based on SCr of 1.47 mg/dL (H)). Recent Labs  Lab 01/26/24 0834 01/26/24 0950 01/26/24 1213 01/26/24 1752 01/26/24 2038 01/26/24 2039 01/27/24 0010 01/27/24 0414  PROCALCITON  --   --  17.70  --   --   --   --  11.39  WBC 40.9*  --   --   --  58.6*  --   --  50.9*  LATICACIDVEN 6.2*   < >  --  4.9*  --  3.8* 3.6* 3.5*   < > = values in this interval not displayed.    Liver Function Tests: Recent Labs  Lab 01/26/24 0834  AST 71*  ALT 26  ALKPHOS 204*  BILITOT 5.1*  PROT 6.4*  ALBUMIN 1.9*   No results for input(s): "LIPASE", "AMYLASE" in the last 168 hours. Recent Labs  Lab 01/26/24 0834 01/26/24 1213  AMMONIA 52* 56*    ABG    Component Value Date/Time   PHART 7.38 01/27/2024 0408   PCO2ART 40 01/27/2024 0408   PO2ART 113 (H) 01/27/2024 0408   HCO3 23.7 01/27/2024 0408   ACIDBASEDEF 1.3 01/27/2024 0408   O2SAT 99.4 01/27/2024 0408     Coagulation Profile: Recent Labs  Lab 01/26/24 0834  INR 1.6*    Cardiac Enzymes: No results for input(s): "CKTOTAL", "CKMB", "CKMBINDEX", "TROPONINI" in the last 168 hours.  HbA1C: Hemoglobin A1C  Date/Time Value Ref Range Status  11/04/2022 12:00 AM 7.8  Final    Comment:    care everywhere  12/06/2015 12:00 AM 5.6  Final   Hgb A1c MFr Bld  Date/Time Value Ref Range Status   01/26/2024 02:15 PM 8.5 (H) 4.8 - 5.6 % Final    Comment:    (NOTE)         Prediabetes: 5.7 - 6.4         Diabetes: >6.4         Glycemic control for adults with diabetes: <7.0   11/17/2018 09:42 AM 9.0 (H) 4.8 - 5.6 % Final    Comment:             Prediabetes: 5.7 - 6.4          Diabetes: >6.4          Glycemic control for adults with diabetes: <7.0     CBG: Recent Labs  Lab 01/27/24 0229 01/27/24 0329 01/27/24 0433 01/27/24 0530 01/27/24 0655  GLUCAP 247* 184* 157* 136* 144*    Review of Systems:   Positives in BOLD: Currently denies all complaints Gen: Denies fever, chills, weight change, fatigue, night sweats HEENT: Denies blurred vision, double vision, hearing loss, tinnitus, sinus congestion, rhinorrhea, sore throat, neck stiffness, dysphagia PULM: Denies shortness of breath, cough, sputum production, hemoptysis, wheezing CV: Denies chest pain, edema, orthopnea, paroxysmal nocturnal dyspnea, palpitations GI: Denies abdominal pain, nausea, vomiting, diarrhea, hematochezia, melena, constipation, change in bowel habits GU: Denies dysuria, hematuria, polyuria, oliguria, urethral discharge Endocrine: Denies hot or cold intolerance, polyuria, polyphagia or appetite change Derm: Denies rash, dry skin, scaling or peeling skin change Heme: Denies easy bruising, bleeding, bleeding gums Neuro: Denies headache, numbness, weakness, slurred speech, loss of memory or consciousness   Past Medical History:  She,  has  a past medical history of Anemia, Anxiety, Arthritis, Cancer (HCC), Chronic pain, Cirrhosis of liver (HCC), Depression, Diabetes mellitus without complication (HCC), Fatty liver disease, nonalcoholic (2021), GERD (gastroesophageal reflux disease), Glaucoma, Hyperlipidemia, Hypertension, Meningioma (HCC), OSA (obstructive sleep apnea) (05/08/2023), Overactive bladder, Pharmacologic therapy (11/02/2023), and Psoriasis (2015).   Surgical History:   Past Surgical History:   Procedure Laterality Date   ABDOMINAL HYSTERECTOMY  2007   CHOLECYSTECTOMY  2000     Social History:   reports that she has never smoked. She has never used smokeless tobacco. She reports that she does not currently use alcohol. She reports that she does not use drugs.   Family History:  Her family history includes Alcohol abuse in her cousin and maternal grandmother; Bipolar disorder in her cousin; Depression in her mother; Diabetes in her mother; Heart disease in her maternal grandfather; Hypertension in her mother; Obesity in her mother; Stroke in her paternal grandfather; Vision loss in her mother.   Allergies No Known Allergies   Home Medications  Prior to Admission medications   Medication Sig Start Date End Date Taking? Authorizing Provider  aspirin 81 MG tablet Take 81 mg by mouth daily.   Yes [provider]  Baclofen 5 MG TABS Take 2 tablets by mouth at bedtime.   Yes [provider]  buPROPion (WELLBUTRIN XL) 150 MG 24 hr tablet Take 1 tablet (150 mg total) by mouth daily. 11/16/23 03/15/24 Yes Hisada, Barbee Cough, MD  canagliflozin [facility]) 300 MG TABS tablet Take by mouth. 02/17/22  Yes [provider]  carvedilol (COREG) 6.25 MG tablet Take 6.25 mg by mouth 2 (two) times daily. 06/19/22  Yes [provider]  citalopram (CELEXA) 20 MG tablet Take 20 mg by mouth daily.   Yes [provider]  Continuous Glucose Sensor (FREESTYLE LIBRE 3 SENSOR) MISC by Does not apply route daily.   Yes [provider]  COSENTYX SENSOREADY, 300 MG, 150 MG/ML SOAJ Inject into the skin.   Yes [provider]  Ferrous Sulfate (SLOW FE) 142 (45 Fe) MG TBCR Take by mouth. 09/12/21  Yes [provider]  furosemide (LASIX) 40 MG tablet Take 40 mg by mouth 2 (two) times daily. 09/25/23  Yes [provider]  gabapentin (NEURONTIN) 300 MG capsule Take by mouth once. 06/02/23 06/01/24 Yes [provider]  insulin lispro  (HUMALOG) 100 UNIT/ML KwikPen Inject into the skin.   Yes [provider]  lactulose (CHRONULAC) 10 GM/15ML solution SMARTSIG:Milliliter(s) By Mouth 06/19/22  Yes [provider]  LANTUS SOLOSTAR 100 UNIT/ML Solostar Pen Inject 66 Units into the skin daily. 11/25/22 01/26/24 Yes [provider]  levETIRAcetam (KEPPRA) 500 MG tablet Take 750 mg by mouth 2 (two) times daily. 01/04/24 07/02/24 Yes [provider]  MAGNESIUM-OXIDE 400 (240 Mg) MG tablet Take 2 tablets by mouth 2 (two) times daily. 10/14/23  Yes [provider]  metFORMIN (GLUCOPHAGE-XR) 500 MG 24 hr tablet Take 1,000 mg by mouth 2 (two) times daily.   Yes [provider]  mirtazapine (REMERON) 15 MG tablet Take 1 tablet (15 mg total) by mouth at bedtime. 11/28/23 01/26/24 Yes Hisada, Barbee Cough, MD  ondansetron (ZOFRAN-ODT) 4 MG disintegrating tablet Dissolve 1 tablet (4 mg total) in the mouth every twelve (12) hours as needed for nausea. 10/05/23 10/04/24 Yes [provider]  pantoprazole (PROTONIX) 40 MG tablet TAKE ONE TABLET BY MOUTH EVERY DAY 10/24/19  Yes McGowan, Carollee Herter A, PA-C  promethazine (PHENERGAN) 12.5 MG tablet Take by mouth.  05/23/22  Yes [provider]  scopolamine (TRANSDERM-SCOP) 1 MG/3DAYS Place 1 patch onto the skin every 3 (three) days. 12/07/23  Yes [provider]  spironolactone (ALDACTONE) 100 MG tablet Take 100 mg by mouth 2 (two) times daily.   Yes [provider]  XIFAXAN 550 MG TABS tablet Take 550 mg by mouth 2 (two) times daily.   Yes [provider]  zolpidem (AMBIEN) 5 MG tablet Take 1 tablet (5 mg total) by mouth at bedtime as needed for sleep. 11/01/23 01/26/24 Yes Neysa Hotter, MD  Accu-Chek Softclix Lancets lancets SMARTSIG:Topical    [provider]  BD PEN NEEDLE NANO 2ND GEN 32G X 4 MM MISC USE 1 NIGHTLY 09/28/23   [provider]  chlorhexidine (PERIDEX) 0.12 % solution SMARTSIG:0.5 Capful(s) By  Mouth Morning-Evening Patient not taking: Reported on 01/26/2024 10/24/23   [provider]  clotrimazole (MYCELEX) 10 MG troche Take 10 mg by mouth 5 (five) times daily. Patient not taking: Reported on 01/26/2024 08/20/23   [provider]  Continuous Glucose Receiver (DEXCOM G7 RECEIVER) DEVI USE AS INSTRUCTED WITH G7 SENSORS Patient not taking: Reported on 01/26/2024 09/04/23   [provider]  Continuous Glucose Sensor (DEXCOM G7 SENSOR) MISC USE TO MONITOR BLOOD GLUCOSE LEVELS CONTINUOUSLY. CHANGE SENSOR EVERY 10 DAYS. Patient not taking: Reported on 01/26/2024    [provider]  Dulaglutide (TRULICITY) 1.5 MG/0.5ML SOPN Inject 1.5 mg into the skin once a week. Thursdays Patient not taking: Reported on 01/26/2024    [provider]  insulin glargine-yfgn (SEMGLEE, YFGN,) 100 UNIT/ML Pen INJECT 50 UNITS UNDER THE SKIN NIGHTLY AS DIRECTED BY YOUR DOCTOR AROUND SAME TIME EVERY DAY. TOTAL DAILY DOSE NOT TO EXCEED 50 UNITS Patient not taking: Reported on 01/26/2024 10/06/23   [provider]  Insulin Pen Needle 32G X 6 MM MISC 1 Syringe by Does not apply route. Use with Victoza.    [provider]  naltrexone (DEPADE) 50 MG tablet Take 50 mg by mouth daily.    [provider]  Secukinumab (COSENTYX SENSOREADY PEN) 150 MG/ML SOAJ Inject the contents of 2 pens (300 mg total) under the skin once a week at weeks 0, 1, 2, 3, and 4. THEN inject the contents of 2 pens (300 mg total) every 4 weeks thereafter. Patient not taking: Reported on 01/26/2024 09/30/23   [provider]  Sharps Container (BD SHARPS COLLECTOR) MISC Use as directed to dispose of Cosentyx pens. 10/05/23   [provider]  simvastatin (ZOCOR) 20 MG tablet TAKE ONE TABLET BY MOUTH EVERY EVENING 10/24/19   Marvel Plan Wellington Hampshire, PA-C     Critical care time: 40 minutes     Harlon Ditty, AGACNP-BC [facility] Prefer epic messenger  for cross cover needs If after hours, please call E-link

## 2024-01-27 NOTE — Progress Notes (Signed)
 PHARMACY - PHYSICIAN COMMUNICATION CRITICAL VALUE ALERT - BLOOD CULTURE IDENTIFICATION (BCID)  Abigail Molina is an 58 y.o. female who presented to [facility] on 01/26/2024 with a chief complaint of sepsis.   Assessment:  Kleb oxytoca in 1 of 4 bottles, no resistance.  (include suspected source if known)  Name of physician (or Provider) Contacted: Cheryll Cockayne Rust-Chester  Current antibiotics: Ceftriaxone 2 gm IV Q24H,  doxycycline and vancomycin  Changes to prescribed antibiotics recommended:  - suggested d/c doxy and vanc.   NP wants to continue current abx regimen and defer to day staff  Results for orders placed or performed during the hospital encounter of 01/26/24  Blood Culture ID Panel (Reflexed) (Collected: 01/26/2024  8:34 AM)  Result Value Ref Range   Enterococcus faecalis NOT DETECTED NOT DETECTED   Enterococcus Faecium NOT DETECTED NOT DETECTED   Listeria monocytogenes NOT DETECTED NOT DETECTED   Staphylococcus species NOT DETECTED NOT DETECTED   Staphylococcus aureus (BCID) NOT DETECTED NOT DETECTED   Staphylococcus epidermidis NOT DETECTED NOT DETECTED   Staphylococcus lugdunensis NOT DETECTED NOT DETECTED   Streptococcus species NOT DETECTED NOT DETECTED   Streptococcus agalactiae NOT DETECTED NOT DETECTED   Streptococcus pneumoniae NOT DETECTED NOT DETECTED   Streptococcus pyogenes NOT DETECTED NOT DETECTED   A.calcoaceticus-baumannii NOT DETECTED NOT DETECTED   Bacteroides fragilis NOT DETECTED NOT DETECTED   Enterobacterales DETECTED (A) NOT DETECTED   Enterobacter cloacae complex NOT DETECTED NOT DETECTED   Escherichia coli NOT DETECTED NOT DETECTED   Klebsiella aerogenes NOT DETECTED NOT DETECTED   Klebsiella oxytoca DETECTED (A) NOT DETECTED   Klebsiella pneumoniae NOT DETECTED NOT DETECTED   Proteus species NOT DETECTED NOT DETECTED   Salmonella species NOT DETECTED NOT DETECTED   Serratia marcescens NOT DETECTED NOT DETECTED   Haemophilus influenzae NOT  DETECTED NOT DETECTED   Neisseria meningitidis NOT DETECTED NOT DETECTED   Pseudomonas aeruginosa NOT DETECTED NOT DETECTED   Stenotrophomonas maltophilia NOT DETECTED NOT DETECTED   Candida albicans NOT DETECTED NOT DETECTED   Candida auris NOT DETECTED NOT DETECTED   Candida glabrata NOT DETECTED NOT DETECTED   Candida krusei NOT DETECTED NOT DETECTED   Candida parapsilosis NOT DETECTED NOT DETECTED   Candida tropicalis NOT DETECTED NOT DETECTED   Cryptococcus neoformans/gattii NOT DETECTED NOT DETECTED   CTX-M ESBL NOT DETECTED NOT DETECTED   Carbapenem resistance IMP NOT DETECTED NOT DETECTED   Carbapenem resistance KPC NOT DETECTED NOT DETECTED   Carbapenem resistance NDM NOT DETECTED NOT DETECTED   Carbapenem resist OXA 48 LIKE NOT DETECTED NOT DETECTED   Carbapenem resistance VIM NOT DETECTED NOT DETECTED    [name] 01/27/2024  3:19 AM

## 2024-01-27 NOTE — Consult Note (Signed)
 PHARMACY - ANTICOAGULATION CONSULT NOTE  Pharmacy Consult for IV Heparin Indication: chest pain/ACS  Patient Measurements: Height: 5\' 4"  (162.6 cm) Weight: 89.9 kg (198 lb 3.1 oz) IBW/kg (Calculated) : 54.7 Heparin Dosing Weight: 74.4 kg  Labs: Recent Labs    01/26/24 0834 01/26/24 1153 01/26/24 2038 01/27/24 0010 01/27/24 0414 01/27/24 0802 01/27/24 2234  HGB 7.2*  --  8.0*  --  8.2*  --   --   HCT 21.9*  --  25.2*  --  24.9*  --   --   PLT 185  --  385  --  416*  --   --   APTT 34  --   --   --   --   --   --   LABPROT 18.8*  --   --   --   --   --   --   INR 1.6*  --   --   --   --   --   --   HEPARINUNFRC  --   --   --   --   --   --  0.24*  CREATININE 2.41*   < > 1.78* 1.69* 1.47*  --   --   TROPONINIHS  --    < > 1,349* 2,680* 4,087* 3,717*  --    < > = values in this interval not displayed.    Estimated Creatinine Clearance: 44.8 mL/min (A) (by C-G formula based on SCr of 1.47 mg/dL (H)).   Medical History: Past Medical History:  Diagnosis Date   Anemia    Anxiety    Arthritis    Cancer (HCC)    Endometrial cancer   Chronic pain    Cirrhosis of liver (HCC)    Depression    Diabetes mellitus without complication (HCC)    Fatty liver disease, nonalcoholic 2021   GERD (gastroesophageal reflux disease)    Glaucoma    Hyperlipidemia    Hypertension    Meningioma (HCC)    OSA (obstructive sleep apnea) 05/08/2023   Overactive bladder    Pharmacologic therapy 11/02/2023   Psoriasis 2015   Medications:  No anticoagulation prior to admission per my chart review  Assessment: 57 y/o F with medical history as above admitted with septic shock secondary to Klebsiella pneumoniae bacteremia complicated by NSTEMI. Pharmacy consulted to initiate and manage heparin infusion for ACS.  Baseline aPTT 34s, INR 1.6. CBC notable for stable anemia.  Goal of Therapy:  Heparin level 0.3-0.7 units/ml Monitor platelets by anticoagulation protocol: Yes   Plan:  2/26:  HL @  2234 = 0.24, SUBtherapeutic - Will order heparin 1100 units IV X 1 bolus and increase drip rate to 1150 units/hr - Will recheck HL 6 hrs after rate change --Daily CBC per protocol while on IV heparin  Miroslav Gin D 01/27/2024,11:10 PM

## 2024-01-27 NOTE — Progress Notes (Addendum)
 Initial Nutrition Assessment  DOCUMENTATION CODES:   Obesity unspecified  INTERVENTION:   Glucerna Shake po TID, each supplement provides 220 kcal and 10 grams of protein  MVI po daily   Thiamine 100mg  po daily   Vitamin C 500mg  po BID   Zinc 220mg  po daily x 14 days   Pt at high refeed risk; recommend monitor potassium, magnesium and phosphorus labs daily until stable  Daily weights   NUTRITION DIAGNOSIS:   Inadequate oral intake related to chronic illness as evidenced by per patient/family report.  GOAL:   Patient will meet greater than or equal to 90% of their needs  MONITOR:   PO intake, Supplement acceptance, Labs, Weight trends, I & O's, Skin  REASON FOR ASSESSMENT:   Malnutrition Screening Tool    ASSESSMENT:   58 y/o female with h/o lichen planus, DM, esophageal dysphagia, HTN, CAD, gastroparesis, cirrhosis, esophageal varices, portal hypertension, OSA, chronic pain, endometrial cancer, CKD III, GERD, MDD and anxiety who is admitted with NSTEMI, UTI, AMS, sepsis, shock and bacteremia.  Met with pt in room today. Pt reports poor appetite and oral intake for several months pta r/t nausea. Per chart, pt is down ~32lbs(14%) over the past 6 months; this is significant weight loss. Of note, pt with a h/o gastroparesis. Pt initiated on a regular diet today. Pt reports that she drinks chocolate and vanilla Glucerna at home. RD discussed with pt the importance of adequate nutrition needed to preserve lean muscle. RD will add supplements and vitamins to help pt meet her estimated needs. Pt is at high refeed risk.   Medications reviewed and include: aspirin, colace, heparin, solu-cortef, lactulose, protonix, miralax, xifaxin, thiamine, ceftriaxone, insulin, levophed, Na bicarbonate, vasopressin   Labs reviewed: Na 125(L), K 4.7 wnl, BUN 54(H), creat 1.47(H), P 3.3 wnl, Mg 2.3 wnl  Wbc- 50.9(H), Hgb 8.2(L), Hct 24.9(L) Cbgs- 209, 143, 139, 144, 136, 157 x 24 hrs  AIC  8.5(H)- 2/25  NUTRITION - FOCUSED PHYSICAL EXAM:  Flowsheet Row Most Recent Value  Orbital Region No depletion  Upper Arm Region No depletion  Thoracic and Lumbar Region No depletion  Buccal Region No depletion  Temple Region No depletion  Clavicle Bone Region Mild depletion  Clavicle and Acromion Bone Region Mild depletion  Scapular Bone Region No depletion  Dorsal Hand No depletion  Patellar Region No depletion  Anterior Thigh Region No depletion  Posterior Calf Region No depletion  Edema (RD Assessment) None  Hair Reviewed  Eyes Reviewed  Mouth Reviewed  Skin Reviewed  Nails Reviewed   Diet Order:   Diet Order             Diet regular Room service appropriate? Yes; Fluid consistency: Thin  Diet effective now                  EDUCATION NEEDS:   Education needs have been addressed  Skin:  Skin Assessment: Reviewed RN Assessment (Stage 3 Pressure Injury; base of sacrum, Stage 2 Pressure Injury: left buttock, Irritant contact dermatitis over both buttocks)  Last BM:  pta  Height:   Ht Readings from Last 1 Encounters:  01/26/24 5\' 4"  (1.626 m)    Weight:   Wt Readings from Last 1 Encounters:  01/27/24 89.9 kg    Ideal Body Weight:  54.5 kg  BMI:  Body mass index is 34.02 kg/m.  Estimated Nutritional Needs:   Kcal:  1900-2200kcal/day  Protein:  95-110g/day  Fluid:  1.7-1.9L/day  Betsey Holiday MS, RD,  LDN If unable to be reached, please send secure chat to "RD inpatient" available from 8:00a-4:00p daily

## 2024-01-27 NOTE — Consult Note (Signed)
 PHARMACY - ANTICOAGULATION CONSULT NOTE  Pharmacy Consult for IV Heparin Indication: chest pain/ACS  Patient Measurements: Height: 5\' 4"  (162.6 cm) Weight: 89.9 kg (198 lb 3.1 oz) IBW/kg (Calculated) : 54.7 Heparin Dosing Weight: 74.4 kg  Labs: Recent Labs    01/26/24 0834 01/26/24 1153 01/26/24 2038 01/27/24 0010 01/27/24 0414 01/27/24 0802  HGB 7.2*  --  8.0*  --  8.2*  --   HCT 21.9*  --  25.2*  --  24.9*  --   PLT 185  --  385  --  416*  --   APTT 34  --   --   --   --   --   LABPROT 18.8*  --   --   --   --   --   INR 1.6*  --   --   --   --   --   CREATININE 2.41*   < > 1.78* 1.69* 1.47*  --   TROPONINIHS  --    < > 1,349* 2,680* 4,087* 3,717*   < > = values in this interval not displayed.    Estimated Creatinine Clearance: 44.8 mL/min (A) (by C-G formula based on SCr of 1.47 mg/dL (H)).   Medical History: Past Medical History:  Diagnosis Date   Anemia    Anxiety    Arthritis    Cancer (HCC)    Endometrial cancer   Chronic pain    Cirrhosis of liver (HCC)    Depression    Diabetes mellitus without complication (HCC)    Fatty liver disease, nonalcoholic 2021   GERD (gastroesophageal reflux disease)    Glaucoma    Hyperlipidemia    Hypertension    Meningioma (HCC)    OSA (obstructive sleep apnea) 05/08/2023   Overactive bladder    Pharmacologic therapy 11/02/2023   Psoriasis 2015   Medications:  No anticoagulation prior to admission per my chart review  Assessment: 58 y/o F with medical history as above admitted with septic shock secondary to Klebsiella pneumoniae bacteremia complicated by NSTEMI. Pharmacy consulted to initiate and manage heparin infusion for ACS.  Baseline aPTT 34s, INR 1.6. CBC notable for stable anemia.  Goal of Therapy:  Heparin level 0.3-0.7 units/ml Monitor platelets by anticoagulation protocol: Yes   Plan:  --Start heparin infusion at 1000 units/hr, no bolus given proximity to recent Webb heparin injection --Check heparin  level 6 hours from initiation of infusion --Daily CBC per protocol while on IV heparin  Tressie Ellis 01/27/2024,3:21 PM

## 2024-01-27 NOTE — Consult Note (Signed)
 [facility] CLINIC CARDIOLOGY CONSULT NOTE       Patient ID: NEIDA ELLEGOOD MRN: [id number] DOB/AGE: 03-20-1966 58 y.o.  Admit date: 01/26/2024 Referring Physician Harlon Ditty, NP Primary Physician Care, [place] [facility]  Primary Cardiologist None Reason for Consultation NSTEMI  HPI: BRAELIN BROSCH is a 58 y.o. female  with a past medical history of hypertension, hyperlipidemia, type 2 diabetes, anemia, endometrial cancer, OSA, GERD, NASH who presented to the ED on 01/26/2024 for unresponsiveness, found down by family and EMS brought her to the ED.  Troponins elevated.  Cardiology was consulted for further evaluation.   Portions of the history obtained via chart review as patient does not recall events prior to her hospitalization.  States that she thinks that she may have had a seizure because she remembers having no control over her body.  The next thing she remembers is waking up in the hospital.  Family went to check on her yesterday and found her unresponsive, EMS was called and she was found to be significantly hypotensive.  She was brought to the ED for further evaluation.  Workup in the ED notable for creatinine 2.41, potassium 6.2, sodium 115, chloride 86, Hgb 7.2, WBC 40.9. Lactic acid elevated at 6.2.  UA positive, respiratory viral panel negative.  She was started on IV fluids and antibiotics in the ED, also intubated in the ED.  She was admitted to the ICU and was extubated earlier this morning.Troponins trended since admission 179 > 591 > 886 > 1349 > 2680 > 4087 > 3717.   At the time of my evaluation this morning patient is resting in hospital bed, reports that overall she feels better than she did.  Does not recall much about what happened.  We discussed any recent symptoms she has been having prior to her hospitalization.  She states that she has mild chest discomfort which has been present for over 6 months, she has intermittent episodes and reports that she takes a medication that helps  this twice a day.  She denies any shortness of breath, palpitations, syncope.  States that she has felt lightheaded at times.  She reports that she lives with her cousin.  Review of systems complete and found to be negative unless listed above    Past Medical History:  Diagnosis Date   Anemia    Anxiety    Arthritis    Cancer (HCC)    Endometrial cancer   Chronic pain    Cirrhosis of liver (HCC)    Depression    Diabetes mellitus without complication (HCC)    Fatty liver disease, nonalcoholic 2021   GERD (gastroesophageal reflux disease)    Glaucoma    Hyperlipidemia    Hypertension    Meningioma (HCC)    OSA (obstructive sleep apnea) 05/08/2023   Overactive bladder    Pharmacologic therapy 11/02/2023   Psoriasis 2015    Past Surgical History:  Procedure Laterality Date   ABDOMINAL HYSTERECTOMY  2007   CHOLECYSTECTOMY  2000    Medications Prior to Admission  Medication Sig Dispense Refill Last Dose/Taking   aspirin 81 MG tablet Take 81 mg by mouth daily.   01/25/2024   Baclofen 5 MG TABS Take 2 tablets by mouth at bedtime.   01/25/2024   buPROPion (WELLBUTRIN XL) 150 MG 24 hr tablet Take 1 tablet (150 mg total) by mouth daily. 30 tablet 3 01/25/2024   canagliflozin (INVOKANA) 300 MG TABS tablet Take by mouth.   01/25/2024   carvedilol (COREG)  6.25 MG tablet Take 6.25 mg by mouth 2 (two) times daily.   01/25/2024   citalopram (CELEXA) 20 MG tablet Take 20 mg by mouth daily.   01/25/2024   Continuous Glucose Sensor (FREESTYLE LIBRE 3 SENSOR) MISC by Does not apply route daily.   01/26/2024   COSENTYX SENSOREADY, 300 MG, 150 MG/ML SOAJ Inject into the skin.   Taking   Ferrous Sulfate (SLOW FE) 142 (45 Fe) MG TBCR Take by mouth.   01/26/2024   furosemide (LASIX) 40 MG tablet Take 40 mg by mouth 2 (two) times daily.   01/25/2024   gabapentin (NEURONTIN) 300 MG capsule Take by mouth once.   01/25/2024   insulin lispro (HUMALOG) 100 UNIT/ML KwikPen Inject into the skin.   01/25/2024    lactulose (CHRONULAC) 10 GM/15ML solution SMARTSIG:Milliliter(s) By Mouth   Past Week   LANTUS SOLOSTAR 100 UNIT/ML Solostar Pen Inject 66 Units into the skin daily.   01/25/2024   levETIRAcetam (KEPPRA) 500 MG tablet Take 750 mg by mouth 2 (two) times daily.   Taking   MAGNESIUM-OXIDE 400 (240 Mg) MG tablet Take 2 tablets by mouth 2 (two) times daily.   01/25/2024   metFORMIN (GLUCOPHAGE-XR) 500 MG 24 hr tablet Take 1,000 mg by mouth 2 (two) times daily.   01/25/2024   mirtazapine (REMERON) 15 MG tablet Take 1 tablet (15 mg total) by mouth at bedtime. 30 tablet 0 01/25/2024   ondansetron (ZOFRAN-ODT) 4 MG disintegrating tablet Dissolve 1 tablet (4 mg total) in the mouth every twelve (12) hours as needed for nausea.   Past Week   pantoprazole (PROTONIX) 40 MG tablet TAKE ONE TABLET BY MOUTH EVERY DAY 90 tablet 0 01/25/2024   promethazine (PHENERGAN) 12.5 MG tablet Take by mouth.   Taking   scopolamine (TRANSDERM-SCOP) 1 MG/3DAYS Place 1 patch onto the skin every 3 (three) days.   Taking   spironolactone (ALDACTONE) 100 MG tablet Take 100 mg by mouth 2 (two) times daily.   01/25/2024   XIFAXAN 550 MG TABS tablet Take 550 mg by mouth 2 (two) times daily.   01/25/2024   zolpidem (AMBIEN) 5 MG tablet Take 1 tablet (5 mg total) by mouth at bedtime as needed for sleep. 30 tablet 1 Past Week   Accu-Chek Softclix Lancets lancets SMARTSIG:Topical      BD PEN NEEDLE NANO 2ND GEN 32G X 4 MM MISC USE 1 NIGHTLY      chlorhexidine (PERIDEX) 0.12 % solution SMARTSIG:0.5 Capful(s) By Mouth Morning-Evening (Patient not taking: Reported on 01/26/2024)   Not Taking   clotrimazole (MYCELEX) 10 MG troche Take 10 mg by mouth 5 (five) times daily. (Patient not taking: Reported on 01/26/2024)   Not Taking   Continuous Glucose Receiver (DEXCOM G7 RECEIVER) DEVI USE AS INSTRUCTED WITH G7 SENSORS (Patient not taking: Reported on 01/26/2024)   Not Taking   Continuous Glucose Sensor (DEXCOM G7 SENSOR) MISC USE TO MONITOR BLOOD GLUCOSE  LEVELS CONTINUOUSLY. CHANGE SENSOR EVERY 10 DAYS. (Patient not taking: Reported on 01/26/2024)   Not Taking   Dulaglutide (TRULICITY) 1.5 MG/0.5ML SOPN Inject 1.5 mg into the skin once a week. Thursdays (Patient not taking: Reported on 01/26/2024)   Not Taking   insulin glargine-yfgn (SEMGLEE, YFGN,) 100 UNIT/ML Pen INJECT 50 UNITS UNDER THE SKIN NIGHTLY AS DIRECTED BY YOUR DOCTOR AROUND SAME TIME EVERY DAY. TOTAL DAILY DOSE NOT TO EXCEED 50 UNITS (Patient not taking: Reported on 01/26/2024)   Not Taking   Insulin Pen Needle 32G X  6 MM MISC 1 Syringe by Does not apply route. Use with Victoza.      naltrexone (DEPADE) 50 MG tablet Take 50 mg by mouth daily.      Secukinumab (COSENTYX SENSOREADY PEN) 150 MG/ML SOAJ Inject the contents of 2 pens (300 mg total) under the skin once a week at weeks 0, 1, 2, 3, and 4. THEN inject the contents of 2 pens (300 mg total) every 4 weeks thereafter. (Patient not taking: Reported on 01/26/2024)   Not Taking   Sharps Container (BD SHARPS COLLECTOR) MISC Use as directed to dispose of Cosentyx pens.      simvastatin (ZOCOR) 20 MG tablet TAKE ONE TABLET BY MOUTH EVERY EVENING 90 tablet 0    Social History   Socioeconomic History   Marital status: Widowed    Spouse name: Not on file   Number of children: Not on file   Years of education: Not on file   Highest education level: Associate degree: academic program  Occupational History   Not on file  Tobacco Use   Smoking status: Never   Smokeless tobacco: Never  Vaping Use   Vaping status: Never Used  Substance and Sexual Activity   Alcohol use: Not Currently    Comment:  Rarely. One or two drinks a year    Drug use: No   Sexual activity: Not Currently    Birth control/protection: Abstinence  Other Topics Concern   Not on file  Social History Narrative   PT is not working right now. Pt worries about financial needs since her husband died in 05-25-2024 and she has no income. She applied for Widow's benefits in July  but has not heard any information and the application is still pending. If possible please inquire about application status.     Social Drivers of Corporate investment banker Strain: Low Risk  (12/16/2023)   Received from [facility]   Overall Financial Resource Strain (CARDIA)    Difficulty of Paying Living Expenses: Not very hard  Food Insecurity: Patient Unable To Answer (01/26/2024)   Hunger Vital Sign    Worried About Running Out of Food in the Last Year: Patient unable to answer    Ran Out of Food in the Last Year: Patient unable to answer  Transportation Needs: Patient Unable To Answer (01/26/2024)   PRAPARE - Transportation    Lack of Transportation (Medical): Patient unable to answer    Lack of Transportation (Non-Medical): Patient unable to answer  Physical Activity: Insufficiently Active (01/09/2021)   Received from [facility], [facility]   Exercise Vital Sign    Days of Exercise per Week: 1 day    Minutes of Exercise per Session: 40 min  Stress: Stress Concern Present (09/17/2022)   Received from [facility], [facility] of Occupational Health - Occupational Stress Questionnaire    Feeling of Stress : Very much  Social Connections: Moderately Isolated (09/02/2018)   Social Connection and Isolation Panel [NHANES]    Frequency of Communication with Friends and Family: Once a week    Frequency of Social Gatherings with Friends and Family: Twice a week    Attends Religious Services: Never    Database administrator or Organizations: No    Attends Banker Meetings: Never    Marital Status: Widowed  Intimate Partner Violence: Patient Unable To Answer (01/26/2024)   Humiliation, Afraid, Rape, and Kick questionnaire    Fear of  Current or Ex-Partner: Patient unable to answer    Emotionally Abused: Patient unable to answer    Physically Abused: Patient unable to answer    Sexually Abused: Patient unable to answer    Family  History  Problem Relation Age of Onset   Diabetes Mother    Hypertension Mother    Depression Mother    Obesity Mother    Vision loss Mother    Alcohol abuse Cousin    Bipolar disorder Cousin    Heart disease Maternal Grandfather    Alcohol abuse Maternal Grandmother    Stroke Paternal Grandfather      Vitals:   01/27/24 1200 01/27/24 1205 01/27/24 1210 01/27/24 1215  BP: (!) 117/36 (!) 116/43 (!) 119/40 (!) 120/40  Pulse: 98 95 95 97  Resp: 16 (!) 22 16 18   Temp:      TempSrc:      SpO2: 99% 98% 98% 97%  Weight:      Height:        PHYSICAL EXAM General: Ill appearing female, well nourished, in no acute distress. HEENT: Normocephalic and atraumatic. Neck: No JVD.  Lungs: Normal respiratory effort on room air. Clear bilaterally to auscultation. No wheezes, crackles, rhonchi.  Heart: HRRR. Normal S1 and S2 without gallops or murmurs.  Abdomen: Non-distended appearing.  Msk: Normal strength and tone for age. Extremities: Warm and well perfused. No clubbing, cyanosis. No edema.  Neuro: Alert and oriented X 3. Psych: Answers questions appropriately.   Labs: Basic Metabolic Panel: Recent Labs    01/26/24 1153 01/26/24 1213 01/27/24 0010 01/27/24 0414  NA  --    < > 122* 125*  K  --    < > 5.4* 4.7  CL  --    < > 90* 93*  CO2  --    < > 19* 21*  GLUCOSE  --    < > 327* 151*  BUN  --    < > 57* 54*  CREATININE  --    < > 1.69* 1.47*  CALCIUM  --    < > 8.9 8.9  MG 2.1  --   --  2.3  PHOS 5.1*  --   --  3.3   < > = values in this interval not displayed.   Liver Function Tests: Recent Labs    01/26/24 0834  AST 71*  ALT 26  ALKPHOS 204*  BILITOT 5.1*  PROT 6.4*  ALBUMIN 1.9*   No results for input(s): "LIPASE", "AMYLASE" in the last 72 hours. CBC: Recent Labs    01/26/24 2038 01/27/24 0414  WBC 58.6* 50.9*  NEUTROABS 53.3* 43.5*  HGB 8.0* 8.2*  HCT 25.2* 24.9*  MCV 82.6 81.4  PLT 385 416*   Cardiac Enzymes: Recent Labs    01/27/24 0010  01/27/24 0414 01/27/24 0802  TROPONINIHS 2,680* 4,087* 3,717*   BNP: Recent Labs    01/26/24 1154  BNP 398.2*   D-Dimer: No results for input(s): "DDIMER" in the last 72 hours. Hemoglobin A1C: Recent Labs    01/26/24 1415  HGBA1C 8.5*   Fasting Lipid Panel: No results for input(s): "CHOL", "HDL", "LDLCALC", "TRIG", "CHOLHDL", "LDLDIRECT" in the last 72 hours. Thyroid Function Tests: No results for input(s): "TSH", "T4TOTAL", "T3FREE", "THYROIDAB" in the last 72 hours.  Invalid input(s): "FREET3" Anemia Panel: No results for input(s): "VITAMINB12", "FOLATE", "FERRITIN", "TIBC", "IRON", "RETICCTPCT" in the last 72 hours.   Radiology: EEG adult Result Date: 01/26/2024 Charlsie Quest, MD  01/26/2024  4:46 PM Patient Name: ORLENA GARMON MRN: [id number] Epilepsy Attending: Charlsie Quest Referring Physician/Provider: Ezequiel Essex, NP Date: 01/26/2024 Duration: 32.31 mins Patient history: 58yo female with ams. EEG to evaluate for seizure Level of alertness: comatose AEDs during EEG study: LEV Technical aspects: This EEG study was done with scalp electrodes positioned according to the 10-20 International system of electrode placement. Electrical activity was reviewed with band pass filter of 1-70Hz , sensitivity of 7 uV/mm, display speed of 57mm/sec with a 60Hz  notched filter applied as appropriate. EEG data were recorded continuously and digitally stored.  Video monitoring was available and reviewed as appropriate. Description: EEG showed continuous generalized 3 to 5 Hz theta-delta slowing. Hyperventilation and photic stimulation were not performed.   ABNORMALITY - Continuous slow, generalized IMPRESSION: This study is suggestive of severe diffuse encephalopathy. No seizures or epileptiform discharges were seen throughout the recording. Charlsie Quest   ECHOCARDIOGRAM COMPLETE Result Date: 01/26/2024    ECHOCARDIOGRAM REPORT   Patient Name:   DIDI GANAWAY Date of Exam: 01/26/2024  Medical Rec #:  [id number]    Height:       64.0 in Accession #:    [id number]   Weight:       212.0 lb Date of Birth:  1966/09/14    BSA:          2.005 m Patient Age:    57 years     BP:           85/65 mmHg Patient Gender: F            HR:           Not listed in chart bpm. Exam Location:  [facility] Procedure: 2D Echo, Cardiac Doppler and Color Doppler (Both Spectral and Color            Flow Doppler were utilized during procedure). Indications:     Abnormal ECG R94.31  History:         Patient has no prior history of Echocardiogram examinations.                  Risk Factors:Hypertension and Dyslipidemia. Anxiety.  Sonographer:     Cristela Blue Referring Phys:  [id number] Ezequiel Essex Diagnosing Phys: Yvonne Kendall MD  Sonographer Comments: Echo performed with patient supine and on artificial respirator and no parasternal window. Zoll pad over sternum. IMPRESSIONS  1. Left ventricular ejection fraction, by estimation, is >55%. The left ventricle has normal function. Left ventricular endocardial border not optimally defined to evaluate regional wall motion. Left ventricular diastolic parameters were normal.  2. Right ventricular systolic function is normal. The right ventricular size is normal. Tricuspid regurgitation signal is inadequate for assessing PA pressure.  3. The mitral valve is normal in structure. No evidence of mitral valve regurgitation. No evidence of mitral stenosis.  4. The aortic valve was not well visualized. Aortic valve regurgitation is not visualized. No aortic stenosis is present. FINDINGS  Left Ventricle: Left ventricular ejection fraction, by estimation, is >55%. The left ventricle has normal function. Left ventricular endocardial border not optimally defined to evaluate regional wall motion. Strain imaging was not performed. The left ventricular internal cavity size was normal in size. There is borderline left ventricular hypertrophy. Left ventricular diastolic parameters were normal. Right  Ventricle: The right ventricular size is normal. No increase in right ventricular wall thickness. Right ventricular systolic function is normal. Tricuspid regurgitation signal is inadequate for assessing PA pressure. Left Atrium:  Left atrial size was normal in size. Right Atrium: Right atrial size was normal in size. Pericardium: There is no evidence of pericardial effusion. Mitral Valve: The mitral valve is normal in structure. No evidence of mitral valve regurgitation. No evidence of mitral valve stenosis. MV peak gradient, 13.0 mmHg. The mean mitral valve gradient is 4.0 mmHg. Tricuspid Valve: The tricuspid valve is normal in structure. Tricuspid valve regurgitation is trivial. Aortic Valve: The aortic valve was not well visualized. Aortic valve regurgitation is not visualized. No aortic stenosis is present. Aortic valve mean gradient measures 3.0 mmHg. Aortic valve peak gradient measures 5.3 mmHg. Aortic valve area, by VTI measures 3.79 cm. Pulmonic Valve: The pulmonic valve was not well visualized. Pulmonic valve regurgitation is not visualized. No evidence of pulmonic stenosis. Aorta: The aortic root is normal in size and structure. Pulmonary Artery: The pulmonary artery is not well seen. Venous: IVC assessment for right atrial pressure unable to be performed due to mechanical ventilation. IAS/Shunts: No atrial level shunt detected by color flow Doppler. Additional Comments: 3D imaging was not performed.  LEFT VENTRICLE PLAX 2D LVIDd:         3.50 cm   Diastology LVIDs:         2.60 cm   LV e' medial:    12.90 cm/s LV PW:         1.10 cm   LV E/e' medial:  6.0 LV IVS:        1.00 cm   LV e' lateral:   16.90 cm/s LVOT diam:     2.00 cm   LV E/e' lateral: 4.6 LV SV:         63 LV SV Index:   31 LVOT Area:     3.14 cm  RIGHT VENTRICLE RV Basal diam:  3.40 cm RV Mid diam:    3.50 cm RV S prime:     15.30 cm/s TAPSE (M-mode): 2.3 cm LEFT ATRIUM           Index        RIGHT ATRIUM           Index LA diam:       3.30 cm 1.65 cm/m   RA Area:     15.10 cm LA Vol (A2C): 19.1 ml 9.52 ml/m   RA Volume:   33.10 ml  16.51 ml/m LA Vol (A4C): 26.1 ml 13.01 ml/m  AORTIC VALVE AV Area (Vmax):    2.92 cm AV Area (Vmean):   3.13 cm AV Area (VTI):     3.79 cm AV Vmax:           115.00 cm/s AV Vmean:          85.900 cm/s AV VTI:            0.165 m AV Peak Grad:      5.3 mmHg AV Mean Grad:      3.0 mmHg LVOT Vmax:         107.00 cm/s LVOT Vmean:        85.600 cm/s LVOT VTI:          0.199 m LVOT/AV VTI ratio: 1.21  AORTA Ao Root diam: 3.10 cm MITRAL VALVE MV Area (PHT): 5.75 cm    SHUNTS MV Area VTI:   2.10 cm    Systemic VTI:  0.20 m MV Peak grad:  13.0 mmHg   Systemic Diam: 2.00 cm MV Mean grad:  4.0 mmHg MV Vmax:  1.80 m/s MV Vmean:      88.2 cm/s MV Decel Time: 132 msec MV E velocity: 77.60 cm/s MV A velocity: 93.80 cm/s MV E/A ratio:  0.83 Yvonne Kendall MD Electronically signed by Yvonne Kendall MD Signature Date/Time: 01/26/2024/4:06:41 PM    Final    DG Chest Port 1 View Result Date: 01/26/2024 CLINICAL DATA:  Central line readjustment. EXAM: PORTABLE CHEST 1 VIEW COMPARISON:  Chest x-ray from same day at 1247 hours. FINDINGS: The patient remains rotated to the left. Interval retraction of the left internal jugular central venous catheter with the tip now in the distal SVC. Unchanged endotracheal and enteric tubes. Unchanged cardiomegaly and left retrocardiac opacity. No pleural effusion or pneumothorax. No acute osseous abnormality. IMPRESSION: 1. Interval retraction of the left internal jugular central venous catheter with the tip now in the distal SVC. Electronically Signed   By: Obie Dredge M.D.   On: 01/26/2024 14:11   DG Chest Port 1 View Result Date: 01/26/2024 CLINICAL DATA:  Central line placement. EXAM: PORTABLE CHEST 1 VIEW COMPARISON:  Chest x-ray from same day at 0950 hours. FINDINGS: The patient is rotated to the left. New left internal jugular central venous catheter with tip in the proximal  right atrium. Unchanged endotracheal and enteric tubes. Unchanged cardiomegaly. Unchanged retrocardiac left lower lobe density. Clear right lung. No large pleural effusion or pneumothorax. No acute osseous abnormality. IMPRESSION: 1. New left internal jugular central venous catheter with tip in the proximal right atrium. No pneumothorax. 2. Unchanged retrocardiac left lower lobe atelectasis or pneumonia. Electronically Signed   By: Obie Dredge M.D.   On: 01/26/2024 14:10   DG Abdomen 1 View Result Date: 01/26/2024 CLINICAL DATA:  post og tube placement EXAM: ABDOMEN - 1 VIEW COMPARISON:  Chest XR and CT AP, concurrent. FINDINGS: Support lines: Enteric decompression tube, with tube tip and side port within stomach. The imaged bowel gas pattern is normal. Cholecystectomy clips. No radio-opaque calculi or other significant radiographic abnormality. IMPRESSION: 1. NG tube, well-positioned with tip and side port within the stomach. 2. Nonobstructed bowel-gas pattern. Electronically Signed   By: Roanna Banning M.D.   On: 01/26/2024 11:44   DG Chest Portable 1 View Result Date: 01/26/2024 CLINICAL DATA:  post intubation EXAM: PORTABLE CHEST 1 VIEW COMPARISON:  Chest XR, 01/26/2024.  CT AP, concurrent. FINDINGS: Support lines: Intubation with ETT within the distal thoracic trachea, 3 cm from carina. Enteric decompression tube with tip extending outside field. Cardiomegaly. Aortic arch atherosclerosis. Mediastinum is within normal limits given technique and patient rotation. The RIGHT lung is clear. Patchy retrocardiac opacity. No large pleural effusion or pneumothorax. No acute displaced fracture. IMPRESSION: 1. Intubation with ETT well-positioned at the distal thoracic trachea. Additional lines and tubes, as above. 2. Cardiomegaly and Aortic Atherosclerosis (ICD10-I70.0). 3. Retrocardiac opacity is likely to represent atelectasis, however early pneumonia can appear similar. Electronically Signed   By: Roanna Banning  M.D.   On: 01/26/2024 11:41   CT ABDOMEN PELVIS WO CONTRAST Result Date: 01/26/2024 CLINICAL DATA:  Sepsis EXAM: CT ABDOMEN AND PELVIS WITHOUT CONTRAST TECHNIQUE: Multidetector CT imaging of the abdomen and pelvis was performed following the standard protocol without IV contrast. RADIATION DOSE REDUCTION: This exam was performed according to the departmental dose-optimization program which includes automated exposure control, adjustment of the mA and/or kV according to patient size and/or use of iterative reconstruction technique. COMPARISON:  CT AP, 11/12/2012 and 08/10/2008. Chest XR and KUB, concurrent. FINDINGS: Lower chest: Trace LEFT pleural effusion  with adjacent dependent patchy pulmonary consolidation. Hepatobiliary: Gross nodular contour liver. No focal abnormality. Cholecystectomy. No biliary dilatation. Pancreas: No pancreatic ductal dilatation or surrounding inflammatory changes. Spleen: Splenomegaly, measuring up to 17.0 cm Adrenals/Urinary Tract: Adrenal glands are unremarkable. Kidneys are normal, without renal calculi, focal lesion, or hydronephrosis. Bladder is decompressed by Foley catheter. Stomach/Bowel: Stomach is within normal limits. Enteric decompression tube, with tip within the gastric body. Appendix appears normal. No evidence of bowel wall thickening, distention, or inflammatory changes. Vascular/Lymphatic: Aortic atherosclerosis without aneurysmal dilatation. No enlarged abdominal or pelvic lymph nodes. Reproductive: Hysterectomy.  No adnexal mass. Other: No abdominal wall hernia or abnormality. Trace mesenteric stranding. Small volume perihepatic, perisplenic, LEFT lower quadrant and deep pelvic ascites. Musculoskeletal: Mild body wall edema. No acute or significant osseous findings. IMPRESSION: Suboptimal evaluation, secondary to a lack of intravenous contrast. 1. No acute abdominopelvic process. 2. Cirrhosis with portal hypertension, as evidenced by splenomegaly. Small volume of  abdominopelvic ascites. 3. Trace LEFT pleural effusion, with adjacent pulmonary consolidation. This may represent atelectasis, though early pneumonia can appear similar. Electronically Signed   By: Roanna Banning M.D.   On: 01/26/2024 11:20   CT HEAD WO CONTRAST ( ) Result Date: 01/26/2024 CLINICAL DATA:  Mental status change, unknown cause; Neck trauma, focal neuro deficit or paresthesia (Age 12-64y) EXAM: CT HEAD WITHOUT CONTRAST CT CERVICAL SPINE WITHOUT CONTRAST TECHNIQUE: Multidetector CT imaging of the head and cervical spine was performed following the standard protocol without intravenous contrast. Multiplanar CT image reconstructions of the cervical spine were also generated. RADIATION DOSE REDUCTION: This exam was performed according to the departmental dose-optimization program which includes automated exposure control, adjustment of the mA and/or kV according to patient size and/or use of iterative reconstruction technique. COMPARISON:  None Available. FINDINGS: CT HEAD FINDINGS Brain: No hemorrhage. No hydrocephalus. No extra-axial fluid collection. No mass effect. No mass lesion. 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 Vascular: No hyperdense vessel or unexpected calcification. Skull: Soft tissue swelling along the left frontal scalp. No evidence of an underlying calvarial fracture. Sinuses/Orbits: No middle ear or mastoid effusion. Paranasal sinuses are clear. Orbits are unremarkable Other: None. CT CERVICAL SPINE FINDINGS Alignment: Normal. Skull base and vertebrae: No acute fracture. No primary bone lesion or focal pathologic process. Soft tissues and spinal canal: No prevertebral fluid or swelling. No visible canal hematoma. Disc levels:  CT evidence of high-grade spinal canal stenosis Upper chest: Negative. Other: Partially imaged endotracheal and enteric tubes in place. IMPRESSION: 1. No CT evidence of intracranial injury. 2. 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 If there  is clinical concern for an acute infarct, consider further evaluation with MRI. 3. Soft tissue swelling along the left frontal scalp. No evidence of an underlying calvarial fracture. 4. No acute fracture or traumatic subluxation of the cervical spine. Electronically Signed   By: Lorenza Cambridge M.D.   On: 01/26/2024 10:46   CT Cervical Spine Wo Contrast Result Date: 01/26/2024 CLINICAL DATA:  Mental status change, unknown cause; Neck trauma, focal neuro deficit or paresthesia (Age 43-64y) EXAM: CT HEAD WITHOUT CONTRAST CT CERVICAL SPINE WITHOUT CONTRAST TECHNIQUE: Multidetector CT imaging of the head and cervical spine was performed following the standard protocol without intravenous contrast. Multiplanar CT image reconstructions of the cervical spine were also generated. RADIATION DOSE REDUCTION: This exam was performed according to the departmental dose-optimization program which includes automated exposure control, adjustment of the mA and/or kV according to patient size and/or use of iterative reconstruction technique. COMPARISON:  None  Available. FINDINGS: CT HEAD FINDINGS Brain: No hemorrhage. No hydrocephalus. No extra-axial fluid collection. No mass effect. No mass lesion. 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 Vascular: No hyperdense vessel or unexpected calcification. Skull: Soft tissue swelling along the left frontal scalp. No evidence of an underlying calvarial fracture. Sinuses/Orbits: No middle ear or mastoid effusion. Paranasal sinuses are clear. Orbits are unremarkable Other: None. CT CERVICAL SPINE FINDINGS Alignment: Normal. Skull base and vertebrae: No acute fracture. No primary bone lesion or focal pathologic process. Soft tissues and spinal canal: No prevertebral fluid or swelling. No visible canal hematoma. Disc levels:  CT evidence of high-grade spinal canal stenosis Upper chest: Negative. Other: Partially imaged endotracheal and enteric tubes in place. IMPRESSION: 1. No CT  evidence of intracranial injury. 2. 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 If there is clinical concern for an acute infarct, consider further evaluation with MRI. 3. Soft tissue swelling along the left frontal scalp. No evidence of an underlying calvarial fracture. 4. No acute fracture or traumatic subluxation of the cervical spine. Electronically Signed   By: Lorenza Cambridge M.D.   On: 01/26/2024 10:46   DG Chest Port 1 View Result Date: 01/26/2024 CLINICAL DATA:  Sepsis. EXAM: PORTABLE CHEST 1 VIEW COMPARISON:  Chest radiograph dated 07/20/2021. FINDINGS: Shallow inspiration with minimal bibasilar atelectasis. No focal consolidation, pleural effusion, or pneumothorax. Mild cardiomegaly with mild central vascular congestion. No acute osseous pathology. IMPRESSION: Mild cardiomegaly with mild central vascular congestion. No focal consolidation. Electronically Signed   By: Elgie Collard M.D.   On: 01/26/2024 10:14    ECHO as above  TELEMETRY reviewed by me 01/27/2024: sinus rhythm rate 90-100s  EKG reviewed by me: Sinus rhythm rate 82 bpm  Data reviewed by me 01/27/2024: last 24h vitals tele labs imaging I/O ED provider note, admission H&P  Principal Problem:   Septic shock (HCC)    ASSESSMENT AND PLAN:  BRYELLA DIVINEY is a 58 y.o. female  with a past medical history of hypertension, hyperlipidemia, type 2 diabetes, anemia, endometrial cancer, OSA, GERD, NASH who presented to the ED on 01/26/2024 for unresponsiveness, found down by family and EMS brought her to the ED.  Troponins elevated.  Cardiology was consulted for further evaluation.   # NSTEMI # Septic shock # Acute metabolic encephalopathy Patient brought to the ED from home after being found unresponsive and hypotensive. Intubated in the ED. Placed on pressors and treated for septic shock, found to be bacteremic. Extubated today 01/27/24. Troponins trended 179 > 591 > 886 > 1349 > 2680 > 4087 > 3717. Echo this admission with preserved  EF. Patient with intermittent CP for 6 months, none at present.  -Heparin held yesterday due to low hemoglobin, however this is stable today and after discussion with PCCM okay to resume. -Given bacteremia, would defer heart catheterization at this time.  Will plan to treat non-STEMI medically and can consider setting patient up for catheterization outpatient once infection appropriately treated. -Continue aspirin 81 mg daily.  Will start Crestor 20 mg daily, patient was taking simvastatin at home. -Remains on norepinephrine and vasopressin, wean off as able.  Once stable off of pressors can consider resuming home BP meds. -Further management of septic shock as per PCCM.   TIMI Risk Score for Unstable Angina or Non-ST Elevation MI:   The patient's TIMI risk score is 3, which indicates a 13% risk of all cause mortality, new or recurrent myocardial infarction or need for urgent revascularization in the next 14  days.   This patient's plan of care was discussed and created with Dr. Darrold Junker and he is in agreement.  Signed: Gale Journey, PA-C  01/27/2024, 12:37 PM [facility]

## 2024-01-27 NOTE — Progress Notes (Signed)
 [facility]  ROUNDING NOTE   Subjective:   Patient now extubated. Breathing comfortably. Serum sodium improved to 125. Acute kidney injury also improved with BUN of 54 and creatinine down to 1.47. Troponin's are elevated.  Objective:  Vital signs in last 24 hours:  Temp:  [97.6 F (36.4 C)-99.2 F (37.3 C)] 98.3 F (36.8 C) (02/26 0830) Pulse Rate:  [78-113] 86 (02/26 0945) Resp:  [11-25] 18 (02/26 0945) BP: (80-171)/(27-153) 109/34 (02/26 0945) SpO2:  [93 %-100 %] 99 % (02/26 0945) FiO2 (%):  [40 %] 40 % (02/26 0814) Weight:  [88.4 kg-89.9 kg] 89.9 kg (02/26 0400)  Weight change:  Filed Weights   01/26/24 1445 01/27/24 0400  Weight: 88.4 kg 89.9 kg    Intake/Output: I/O last 3 completed shifts: In: 6031.6 [I.V.:2571.3; IV Piggyback:3460.4] Out: 3610 [Urine:3560; Emesis/NG output:50]   Intake/Output this shift:  Total I/O In: 204.3 [I.V.:204.3] Out: -   Physical Exam: General: No acute distress  Head: Normocephalic, atraumatic. Moist oral mucosal membranes  Neck: Supple  Lungs:  Scattered rhonchi, normal effort  Heart: S1S2 no rubs  Abdomen:  Soft, nontender, bowel sounds present  Extremities: Trace peripheral edema.  Neurologic: Awake, alert, following commands  Skin: No acute rash  Access: No hemodialysis access    Basic Metabolic Panel: Recent Labs  Lab 01/26/24 0834 01/26/24 0950 01/26/24 1144 01/26/24 1153 01/26/24 1213 01/26/24 1554 01/26/24 2038 01/27/24 0010 01/27/24 0414  NA 115*   < > 120*  --  119*  --  121* 122* 125*  K 6.2*  --   --   --  6.6* 6.2* 5.9* 5.4* 4.7  CL 86*  --   --   --  89*  --  90* 90* 93*  CO2 17*  --   --   --  17*  --  19* 19* 21*  GLUCOSE 126*  --   --   --  297*  --  347* 327* 151*  BUN 67*  --   --   --  61*  --  57* 57* 54*  CREATININE 2.41*  --   --   --  2.05*  --  1.78* 1.69* 1.47*  CALCIUM 8.6*  --   --   --  8.3*  --  8.9 8.9 8.9  MG  --   --   --  2.1  --   --   --   --  2.3  PHOS  --    --   --  5.1*  --   --   --   --  3.3   < > = values in this interval not displayed.    Liver Function Tests: Recent Labs  Lab 01/26/24 0834  AST 71*  ALT 26  ALKPHOS 204*  BILITOT 5.1*  PROT 6.4*  ALBUMIN 1.9*   No results for input(s): "LIPASE", "AMYLASE" in the last 168 hours. Recent Labs  Lab 01/26/24 0834 01/26/24 1213  AMMONIA 52* 56*    CBC: Recent Labs  Lab 01/26/24 0834 01/26/24 2038 01/27/24 0414  WBC 40.9* 58.6* 50.9*  NEUTROABS 36.1* 53.3* 43.5*  HGB 7.2* 8.0* 8.2*  HCT 21.9* 25.2* 24.9*  MCV 80.8 82.6 81.4  PLT 185 385 416*    Cardiac Enzymes: No results for input(s): "CKTOTAL", "CKMB", "CKMBINDEX", "TROPONINI" in the last 168 hours.  BNP: Invalid input(s): "POCBNP"  CBG: Recent Labs  Lab 01/27/24 0433 01/27/24 0530 01/27/24 0655 01/27/24 0858 01/27/24 1026  GLUCAP 157* 136* 144*  139* 143*    Microbiology: Results for orders placed or performed during the hospital encounter of 01/26/24  Resp panel by RT-PCR (RSV, Flu A&B, Covid) Anterior Nasal Swab     Status: None   Collection Time: 01/26/24  8:34 AM   Specimen: Anterior Nasal Swab  Result Value Ref Range Status   SARS Coronavirus 2 by RT PCR NEGATIVE NEGATIVE Final    Comment: (NOTE) SARS-CoV-2 target nucleic acids are NOT DETECTED.  The SARS-CoV-2 RNA is generally detectable in upper respiratory specimens during the acute phase of infection. The lowest concentration of SARS-CoV-2 viral copies this assay can detect is 138 copies/mL. A negative result does not preclude SARS-Cov-2 infection and should not be used as the sole basis for treatment or other patient management decisions. A negative result may occur with  improper specimen collection/handling, submission of specimen other than nasopharyngeal swab, presence of viral mutation(s) within the areas targeted by this assay, and inadequate number of viral copies(<138 copies/mL). A negative result must be combined with clinical  observations, patient history, and epidemiological information. The expected result is Negative.  Fact Sheet for Patients:  BloggerCourse.com  Fact Sheet for Healthcare Providers:  SeriousBroker.it  This test is no t yet approved or cleared by the [place] FDA and  has been authorized for detection and/or diagnosis of SARS-CoV-2 by FDA under an Emergency Use Authorization (EUA). This EUA will remain  in effect (meaning this test can be used) for the duration of the COVID-19 declaration under Section 564(b)(1) of the Act, 21 U.S.C.section 360bbb-3(b)(1), unless the authorization is terminated  or revoked sooner.       Influenza A by PCR NEGATIVE NEGATIVE Final   Influenza B by PCR NEGATIVE NEGATIVE Final    Comment: (NOTE) The Xpert Xpress SARS-CoV-2/FLU/RSV plus assay is intended as an aid in the diagnosis of influenza from Nasopharyngeal swab specimens and should not be used as a sole basis for treatment. Nasal washings and aspirates are unacceptable for Xpert Xpress SARS-CoV-2/FLU/RSV testing.  Fact Sheet for Patients: BloggerCourse.com  Fact Sheet for Healthcare Providers: SeriousBroker.it  This test is not yet approved or cleared by the [place] FDA and has been authorized for detection and/or diagnosis of SARS-CoV-2 by FDA under an Emergency Use Authorization (EUA). This EUA will remain in effect (meaning this test can be used) for the duration of the COVID-19 declaration under Section 564(b)(1) of the Act, 21 U.S.C. section 360bbb-3(b)(1), unless the authorization is terminated or revoked.     Resp Syncytial Virus by PCR NEGATIVE NEGATIVE Final    Comment: (NOTE) Fact Sheet for Patients: BloggerCourse.com  Fact Sheet for Healthcare Providers: SeriousBroker.it  This test is not yet approved or cleared by  the [place] FDA and has been authorized for detection and/or diagnosis of SARS-CoV-2 by FDA under an Emergency Use Authorization (EUA). This EUA will remain in effect (meaning this test can be used) for the duration of the COVID-19 declaration under Section 564(b)(1) of the Act, 21 U.S.C. section 360bbb-3(b)(1), unless the authorization is terminated or revoked.  Performed at [facility], [address] [place], [place] [address]   Blood Culture (routine x 2)     Status: None (Preliminary result)   Collection Time: 01/26/24  8:34 AM   Specimen: BLOOD  Result Value Ref Range Status   Specimen Description   Final    BLOOD RIGHT [facility] Performed at [facility], [address] [place], [place] [address]    Special Requests  Final    BOTTLES DRAWN AEROBIC AND ANAEROBIC Blood Culture results may not be optimal due to an inadequate volume of blood received in culture bottles Performed at [facility], [address] [place], [place] [address]    Culture  Setup Time   Final    GRAM NEGATIVE RODS IN BOTH AEROBIC AND ANAEROBIC BOTTLES CRITICAL RESULT CALLED TO, READ BACK BY AND VERIFIED WITH: JASON ROBBINS PHARMD @0240  01/27/24 ASW    Culture GRAM NEGATIVE RODS  Final   Report Status PENDING  Incomplete  Blood Culture (routine x 2)     Status: None (Preliminary result)   Collection Time: 01/26/24  8:34 AM   Specimen: BLOOD  Result Value Ref Range Status   Specimen Description BLOOD LEFT ANTECUBITAL  Final   Special Requests   Final    BOTTLES DRAWN AEROBIC AND ANAEROBIC Blood Culture results may not be optimal due to an inadequate volume of blood received in culture bottles   Culture   Final    NO GROWTH < 24 HOURS Performed at [facility], [address] [place], [place] [address]    Report Status PENDING  Incomplete  Urine Culture     Status: None (Preliminary result)   Collection Time: 01/26/24  8:34 AM   Specimen: Urine, Clean  Catch  Result Value Ref Range Status   Specimen Description   Final    URINE, CLEAN CATCH Performed at [facility], [address], [place], [place] [address]    Special Requests   Final    NONE Reflexed from [id number] Performed at [facility], [address] [place], [place] [address]    Culture PENDING  Incomplete   Report Status PENDING  Incomplete  Blood Culture ID Panel (Reflexed)     Status: Abnormal   Collection Time: 01/26/24  8:34 AM  Result Value Ref Range Status   Enterococcus faecalis NOT DETECTED NOT DETECTED Final   Enterococcus Faecium NOT DETECTED NOT DETECTED Final   Listeria monocytogenes NOT DETECTED NOT DETECTED Final   Staphylococcus species NOT DETECTED NOT DETECTED Final   Staphylococcus aureus (BCID) NOT DETECTED NOT DETECTED Final   Staphylococcus epidermidis NOT DETECTED NOT DETECTED Final   Staphylococcus lugdunensis NOT DETECTED NOT DETECTED Final   Streptococcus species NOT DETECTED NOT DETECTED Final   Streptococcus agalactiae NOT DETECTED NOT DETECTED Final   Streptococcus pneumoniae NOT DETECTED NOT DETECTED Final   Streptococcus pyogenes NOT DETECTED NOT DETECTED Final   A.calcoaceticus-baumannii NOT DETECTED NOT DETECTED Final   Bacteroides fragilis NOT DETECTED NOT DETECTED Final   Enterobacterales DETECTED (A) NOT DETECTED Final    Comment: Enterobacterales represent a large order of gram negative bacteria, not a single organism. CRITICAL RESULT CALLED TO, READ BACK BY AND VERIFIED WITH: JASON ROBBINS PHARMD @0240  01/27/24 ASW    Enterobacter cloacae complex NOT DETECTED NOT DETECTED Final   Escherichia coli NOT DETECTED NOT DETECTED Final   Klebsiella aerogenes NOT DETECTED NOT DETECTED Final   Klebsiella oxytoca DETECTED (A) NOT DETECTED Final    Comment: CRITICAL RESULT CALLED TO, READ BACK BY AND VERIFIED WITH: JASON ROBBINS PHARMD @0240  01/27/24 ASW    Klebsiella pneumoniae NOT DETECTED NOT DETECTED Final   Proteus  species NOT DETECTED NOT DETECTED Final   Salmonella species NOT DETECTED NOT DETECTED Final   Serratia marcescens NOT DETECTED NOT DETECTED Final   Haemophilus influenzae NOT DETECTED NOT DETECTED Final   Neisseria meningitidis NOT DETECTED NOT DETECTED Final  Pseudomonas aeruginosa NOT DETECTED NOT DETECTED Final   Stenotrophomonas maltophilia NOT DETECTED NOT DETECTED Final   Candida albicans NOT DETECTED NOT DETECTED Final   Candida auris NOT DETECTED NOT DETECTED Final   Candida glabrata NOT DETECTED NOT DETECTED Final   Candida krusei NOT DETECTED NOT DETECTED Final   Candida parapsilosis NOT DETECTED NOT DETECTED Final   Candida tropicalis NOT DETECTED NOT DETECTED Final   Cryptococcus neoformans/gattii NOT DETECTED NOT DETECTED Final   CTX-M ESBL NOT DETECTED NOT DETECTED Final   Carbapenem resistance IMP NOT DETECTED NOT DETECTED Final   Carbapenem resistance KPC NOT DETECTED NOT DETECTED Final   Carbapenem resistance NDM NOT DETECTED NOT DETECTED Final   Carbapenem resist OXA 48 LIKE NOT DETECTED NOT DETECTED Final   Carbapenem resistance VIM NOT DETECTED NOT DETECTED Final    Comment: Performed at [facility], [address] [place], [place] [address]  MRSA Next Gen by PCR, Nasal     Status: Abnormal   Collection Time: 01/26/24 11:42 AM   Specimen: Nasal Mucosa; Nasal Swab  Result Value Ref Range Status   MRSA by PCR Next Gen DETECTED (A) NOT DETECTED Final    Comment: RESULT CALLED TO, READ BACK BY AND VERIFIED WITH: Claudette Stapler 01/26/24 1315 MW (NOTE) The GeneXpert MRSA Assay (FDA approved for NASAL specimens only), is one component of a comprehensive MRSA colonization surveillance program. It is not intended to diagnose MRSA infection nor to guide or monitor treatment for MRSA infections. Test performance is not FDA approved in patients less than 97 years old. Performed at [facility], [address] [place], [place] [address]   Culture,  Respiratory w Gram Stain     Status: None (Preliminary result)   Collection Time: 01/26/24  2:06 PM   Specimen: Tracheal Aspirate; Respiratory  Result Value Ref Range Status   Specimen Description TRACHEAL ASPIRATE  Final   Special Requests NONE  Final   Gram Stain   Final    RARE WBC PRESENT, PREDOMINANTLY PMN NO ORGANISMS SEEN    Culture   Final    CULTURE REINCUBATED FOR BETTER GROWTH Performed at [facility], [address], [place], [place] [address]    Report Status PENDING  Incomplete  Respiratory (~20 pathogens) panel by PCR     Status: None   Collection Time: 01/26/24  7:00 PM   Specimen: Nasopharyngeal Swab; Respiratory  Result Value Ref Range Status   Adenovirus NOT DETECTED NOT DETECTED Final   Coronavirus 229E NOT DETECTED NOT DETECTED Final    Comment: (NOTE) The Coronavirus on the Respiratory Panel, DOES NOT test for the novel  Coronavirus (2019 nCoV)    Coronavirus HKU1 NOT DETECTED NOT DETECTED Final   Coronavirus NL63 NOT DETECTED NOT DETECTED Final   Coronavirus OC43 NOT DETECTED NOT DETECTED Final   Metapneumovirus NOT DETECTED NOT DETECTED Final   Rhinovirus / Enterovirus NOT DETECTED NOT DETECTED Final   Influenza A NOT DETECTED NOT DETECTED Final   Influenza B NOT DETECTED NOT DETECTED Final   Parainfluenza Virus 1 NOT DETECTED NOT DETECTED Final   Parainfluenza Virus 2 NOT DETECTED NOT DETECTED Final   Parainfluenza Virus 3 NOT DETECTED NOT DETECTED Final   Parainfluenza Virus 4 NOT DETECTED NOT DETECTED Final   Respiratory Syncytial Virus NOT DETECTED NOT DETECTED Final   Bordetella pertussis NOT DETECTED NOT DETECTED Final   Bordetella Parapertussis NOT DETECTED NOT DETECTED Final   Chlamydophila pneumoniae NOT DETECTED NOT DETECTED Final  Mycoplasma pneumoniae NOT DETECTED NOT DETECTED Final    Comment: Performed at [facility], [address] [place], [place] [address]    Coagulation Studies: Recent Labs    01/26/24 0834   LABPROT 18.8*  INR 1.6*    Urinalysis: Recent Labs    01/26/24 0834  COLORURINE YELLOW*  LABSPEC 1.013  PHURINE 6.0  GLUCOSEU >=500*  HGBUR MODERATE*  BILIRUBINUR NEGATIVE  KETONESUR NEGATIVE  PROTEINUR NEGATIVE  NITRITE NEGATIVE  LEUKOCYTESUR SMALL*      Imaging: EEG adult Result Date: 01/26/2024 Charlsie Quest, MD     01/26/2024  4:46 PM Patient Name: VARVARA LEGAULT MRN: [id number] Epilepsy Attending: Charlsie Quest Referring Physician/Provider: Ezequiel Essex, NP Date: 01/26/2024 Duration: 32.31 mins Patient history: 58yo female with ams. EEG to evaluate for seizure Level of alertness: comatose AEDs during EEG study: LEV Technical aspects: This EEG study was done with scalp electrodes positioned according to the 10-20 International system of electrode placement. Electrical activity was reviewed with band pass filter of 1-70Hz , sensitivity of 7 uV/mm, display speed of 78mm/sec with a 60Hz  notched filter applied as appropriate. EEG data were recorded continuously and digitally stored.  Video monitoring was available and reviewed as appropriate. Description: EEG showed continuous generalized 3 to 5 Hz theta-delta slowing. Hyperventilation and photic stimulation were not performed.   ABNORMALITY - Continuous slow, generalized IMPRESSION: This study is suggestive of severe diffuse encephalopathy. No seizures or epileptiform discharges were seen throughout the recording. Charlsie Quest   ECHOCARDIOGRAM COMPLETE Result Date: 01/26/2024    ECHOCARDIOGRAM REPORT   Patient Name:   KIMBERLEIGH MEHAN Date of Exam: 01/26/2024 Medical Rec #:  [id number]    Height:       64.0 in Accession #:    [id number]   Weight:       212.0 lb Date of Birth:  [birth year]    BSA:          2.005 m Patient Age:    57 years     BP:           85/65 mmHg Patient Gender: F            HR:           Not listed in chart bpm. Exam Location:  [facility] Procedure: 2D Echo, Cardiac Doppler and Color Doppler (Both Spectral and Color             Flow Doppler were utilized during procedure). Indications:     Abnormal ECG R94.31  History:         Patient has no prior history of Echocardiogram examinations.                  Risk Factors:Hypertension and Dyslipidemia. Anxiety.  Sonographer:     Cristela Blue Referring Phys:  [id number] Ezequiel Essex Diagnosing Phys: Yvonne Kendall MD  Sonographer Comments: Echo performed with patient supine and on artificial respirator and no parasternal window. Zoll pad over sternum. IMPRESSIONS  1. Left ventricular ejection fraction, by estimation, is >55%. The left ventricle has normal function. Left ventricular endocardial border not optimally defined to evaluate regional wall motion. Left ventricular diastolic parameters were normal.  2. Right ventricular systolic function is normal. The right ventricular size is normal. Tricuspid regurgitation signal is inadequate for assessing PA pressure.  3. The mitral valve is normal in structure. No evidence of mitral valve regurgitation. No evidence of mitral stenosis.  4. The aortic valve was not  well visualized. Aortic valve regurgitation is not visualized. No aortic stenosis is present. FINDINGS  Left Ventricle: Left ventricular ejection fraction, by estimation, is >55%. The left ventricle has normal function. Left ventricular endocardial border not optimally defined to evaluate regional wall motion. Strain imaging was not performed. The left ventricular internal cavity size was normal in size. There is borderline left ventricular hypertrophy. Left ventricular diastolic parameters were normal. Right Ventricle: The right ventricular size is normal. No increase in right ventricular wall thickness. Right ventricular systolic function is normal. Tricuspid regurgitation signal is inadequate for assessing PA pressure. Left Atrium: Left atrial size was normal in size. Right Atrium: Right atrial size was normal in size. Pericardium: There is no evidence of pericardial effusion. Mitral  Valve: The mitral valve is normal in structure. No evidence of mitral valve regurgitation. No evidence of mitral valve stenosis. MV peak gradient, 13.0 mmHg. The mean mitral valve gradient is 4.0 mmHg. Tricuspid Valve: The tricuspid valve is normal in structure. Tricuspid valve regurgitation is trivial. Aortic Valve: The aortic valve was not well visualized. Aortic valve regurgitation is not visualized. No aortic stenosis is present. Aortic valve mean gradient measures 3.0 mmHg. Aortic valve peak gradient measures 5.3 mmHg. Aortic valve area, by VTI measures 3.79 cm. Pulmonic Valve: The pulmonic valve was not well visualized. Pulmonic valve regurgitation is not visualized. No evidence of pulmonic stenosis. Aorta: The aortic root is normal in size and structure. Pulmonary Artery: The pulmonary artery is not well seen. Venous: IVC assessment for right atrial pressure unable to be performed due to mechanical ventilation. IAS/Shunts: No atrial level shunt detected by color flow Doppler. Additional Comments: 3D imaging was not performed.  LEFT VENTRICLE PLAX 2D LVIDd:         3.50 cm   Diastology LVIDs:         2.60 cm   LV e' medial:    12.90 cm/s LV PW:         1.10 cm   LV E/e' medial:  6.0 LV IVS:        1.00 cm   LV e' lateral:   16.90 cm/s LVOT diam:     2.00 cm   LV E/e' lateral: 4.6 LV SV:         63 LV SV Index:   31 LVOT Area:     3.14 cm  RIGHT VENTRICLE RV Basal diam:  3.40 cm RV Mid diam:    3.50 cm RV S prime:     15.30 cm/s TAPSE (M-mode): 2.3 cm LEFT ATRIUM           Index        RIGHT ATRIUM           Index LA diam:      3.30 cm 1.65 cm/m   RA Area:     15.10 cm LA Vol (A2C): 19.1 ml 9.52 ml/m   RA Volume:   33.10 ml  16.51 ml/m LA Vol (A4C): 26.1 ml 13.01 ml/m  AORTIC VALVE AV Area (Vmax):    2.92 cm AV Area (Vmean):   3.13 cm AV Area (VTI):     3.79 cm AV Vmax:           115.00 cm/s AV Vmean:          85.900 cm/s AV VTI:            0.165 m AV Peak Grad:      5.3 mmHg AV Mean Grad:  3.0  mmHg LVOT Vmax:         107.00 cm/s LVOT Vmean:        85.600 cm/s LVOT VTI:          0.199 m LVOT/AV VTI ratio: 1.21  AORTA Ao Root diam: 3.10 cm MITRAL VALVE MV Area (PHT): 5.75 cm    SHUNTS MV Area VTI:   2.10 cm    Systemic VTI:  0.20 m MV Peak grad:  13.0 mmHg   Systemic Diam: 2.00 cm MV Mean grad:  4.0 mmHg MV Vmax:       1.80 m/s MV Vmean:      88.2 cm/s MV Decel Time: 132 msec MV E velocity: 77.60 cm/s MV A velocity: 93.80 cm/s MV E/A ratio:  0.83 Yvonne Kendall MD Electronically signed by Yvonne Kendall MD Signature Date/Time: 01/26/2024/4:06:41 PM    Final    DG Chest Port 1 View Result Date: 01/26/2024 CLINICAL DATA:  Central line readjustment. EXAM: PORTABLE CHEST 1 VIEW COMPARISON:  Chest x-ray from same day at 1247 hours. FINDINGS: The patient remains rotated to the left. Interval retraction of the left internal jugular central venous catheter with the tip now in the distal SVC. Unchanged endotracheal and enteric tubes. Unchanged cardiomegaly and left retrocardiac opacity. No pleural effusion or pneumothorax. No acute osseous abnormality. IMPRESSION: 1. Interval retraction of the left internal jugular central venous catheter with the tip now in the distal SVC. Electronically Signed   By: Obie Dredge M.D.   On: 01/26/2024 14:11   DG Chest Port 1 View Result Date: 01/26/2024 CLINICAL DATA:  Central line placement. EXAM: PORTABLE CHEST 1 VIEW COMPARISON:  Chest x-ray from same day at 0950 hours. FINDINGS: The patient is rotated to the left. New left internal jugular central venous catheter with tip in the proximal right atrium. Unchanged endotracheal and enteric tubes. Unchanged cardiomegaly. Unchanged retrocardiac left lower lobe density. Clear right lung. No large pleural effusion or pneumothorax. No acute osseous abnormality. IMPRESSION: 1. New left internal jugular central venous catheter with tip in the proximal right atrium. No pneumothorax. 2. Unchanged retrocardiac left lower lobe  atelectasis or pneumonia. Electronically Signed   By: Obie Dredge M.D.   On: 01/26/2024 14:10   DG Abdomen 1 View Result Date: 01/26/2024 CLINICAL DATA:  post og tube placement EXAM: ABDOMEN - 1 VIEW COMPARISON:  Chest XR and CT AP, concurrent. FINDINGS: Support lines: Enteric decompression tube, with tube tip and side port within stomach. The imaged bowel gas pattern is normal. Cholecystectomy clips. No radio-opaque calculi or other significant radiographic abnormality. IMPRESSION: 1. NG tube, well-positioned with tip and side port within the stomach. 2. Nonobstructed bowel-gas pattern. Electronically Signed   By: Roanna Banning M.D.   On: 01/26/2024 11:44   DG Chest Portable 1 View Result Date: 01/26/2024 CLINICAL DATA:  post intubation EXAM: PORTABLE CHEST 1 VIEW COMPARISON:  Chest XR, 01/26/2024.  CT AP, concurrent. FINDINGS: Support lines: Intubation with ETT within the distal thoracic trachea, 3 cm from carina. Enteric decompression tube with tip extending outside field. Cardiomegaly. Aortic arch atherosclerosis. Mediastinum is within normal limits given technique and patient rotation. The RIGHT lung is clear. Patchy retrocardiac opacity. No large pleural effusion or pneumothorax. No acute displaced fracture. IMPRESSION: 1. Intubation with ETT well-positioned at the distal thoracic trachea. Additional lines and tubes, as above. 2. Cardiomegaly and Aortic Atherosclerosis (ICD10-I70.0). 3. Retrocardiac opacity is likely to represent atelectasis, however early pneumonia can appear similar. Electronically Signed   By:  Roanna Banning M.D.   On: 01/26/2024 11:41   CT ABDOMEN PELVIS WO CONTRAST Result Date: 01/26/2024 CLINICAL DATA:  Sepsis EXAM: CT ABDOMEN AND PELVIS WITHOUT CONTRAST TECHNIQUE: Multidetector CT imaging of the abdomen and pelvis was performed following the standard protocol without IV contrast. RADIATION DOSE REDUCTION: This exam was performed according to the departmental dose-optimization  program which includes automated exposure control, adjustment of the mA and/or kV according to patient size and/or use of iterative reconstruction technique. COMPARISON:  CT AP, 11/12/2012 and 08/10/2008. Chest XR and KUB, concurrent. FINDINGS: Lower chest: Trace LEFT pleural effusion with adjacent dependent patchy pulmonary consolidation. Hepatobiliary: Gross nodular contour liver. No focal abnormality. Cholecystectomy. No biliary dilatation. Pancreas: No pancreatic ductal dilatation or surrounding inflammatory changes. Spleen: Splenomegaly, measuring up to 17.0 cm Adrenals/Urinary Tract: Adrenal glands are unremarkable. Kidneys are normal, without renal calculi, focal lesion, or hydronephrosis. Bladder is decompressed by Foley catheter. Stomach/Bowel: Stomach is within normal limits. Enteric decompression tube, with tip within the gastric body. Appendix appears normal. No evidence of bowel wall thickening, distention, or inflammatory changes. Vascular/Lymphatic: Aortic atherosclerosis without aneurysmal dilatation. No enlarged abdominal or pelvic lymph nodes. Reproductive: Hysterectomy.  No adnexal mass. Other: No abdominal wall hernia or abnormality. Trace mesenteric stranding. Small volume perihepatic, perisplenic, LEFT lower quadrant and deep pelvic ascites. Musculoskeletal: Mild body wall edema. No acute or significant osseous findings. IMPRESSION: Suboptimal evaluation, secondary to a lack of intravenous contrast. 1. No acute abdominopelvic process. 2. Cirrhosis with portal hypertension, as evidenced by splenomegaly. Small volume of abdominopelvic ascites. 3. Trace LEFT pleural effusion, with adjacent pulmonary consolidation. This may represent atelectasis, though early pneumonia can appear similar. Electronically Signed   By: Roanna Banning M.D.   On: 01/26/2024 11:20   CT HEAD WO CONTRAST ( ) Result Date: 01/26/2024 CLINICAL DATA:  Mental status change, unknown cause; Neck trauma, focal neuro deficit or  paresthesia (Age 72-64y) EXAM: CT HEAD WITHOUT CONTRAST CT CERVICAL SPINE WITHOUT CONTRAST TECHNIQUE: Multidetector CT imaging of the head and cervical spine was performed following the standard protocol without intravenous contrast. Multiplanar CT image reconstructions of the cervical spine were also generated. RADIATION DOSE REDUCTION: This exam was performed according to the departmental dose-optimization program which includes automated exposure control, adjustment of the mA and/or kV according to patient size and/or use of iterative reconstruction technique. COMPARISON:  None Available. FINDINGS: CT HEAD FINDINGS Brain: No hemorrhage. No hydrocephalus. No extra-axial fluid collection. No mass effect. No mass lesion. 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 Vascular: No hyperdense vessel or unexpected calcification. Skull: Soft tissue swelling along the left frontal scalp. No evidence of an underlying calvarial fracture. Sinuses/Orbits: No middle ear or mastoid effusion. Paranasal sinuses are clear. Orbits are unremarkable Other: None. CT CERVICAL SPINE FINDINGS Alignment: Normal. Skull base and vertebrae: No acute fracture. No primary bone lesion or focal pathologic process. Soft tissues and spinal canal: No prevertebral fluid or swelling. No visible canal hematoma. Disc levels:  CT evidence of high-grade spinal canal stenosis Upper chest: Negative. Other: Partially imaged endotracheal and enteric tubes in place. IMPRESSION: 1. No CT evidence of intracranial injury. 2. 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 If there is clinical concern for an acute infarct, consider further evaluation with MRI. 3. Soft tissue swelling along the left frontal scalp. No evidence of an underlying calvarial fracture. 4. No acute fracture or traumatic subluxation of the cervical spine. Electronically Signed   By: Lorenza Cambridge M.D.   On: 01/26/2024 10:46   CT  Cervical Spine Wo Contrast Result Date:  01/26/2024 CLINICAL DATA:  Mental status change, unknown cause; Neck trauma, focal neuro deficit or paresthesia (Age 27-64y) EXAM: CT HEAD WITHOUT CONTRAST CT CERVICAL SPINE WITHOUT CONTRAST TECHNIQUE: Multidetector CT imaging of the head and cervical spine was performed following the standard protocol without intravenous contrast. Multiplanar CT image reconstructions of the cervical spine were also generated. RADIATION DOSE REDUCTION: This exam was performed according to the departmental dose-optimization program which includes automated exposure control, adjustment of the mA and/or kV according to patient size and/or use of iterative reconstruction technique. COMPARISON:  None Available. FINDINGS: CT HEAD FINDINGS Brain: No hemorrhage. No hydrocephalus. No extra-axial fluid collection. No mass effect. No mass lesion. 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 Vascular: No hyperdense vessel or unexpected calcification. Skull: Soft tissue swelling along the left frontal scalp. No evidence of an underlying calvarial fracture. Sinuses/Orbits: No middle ear or mastoid effusion. Paranasal sinuses are clear. Orbits are unremarkable Other: None. CT CERVICAL SPINE FINDINGS Alignment: Normal. Skull base and vertebrae: No acute fracture. No primary bone lesion or focal pathologic process. Soft tissues and spinal canal: No prevertebral fluid or swelling. No visible canal hematoma. Disc levels:  CT evidence of high-grade spinal canal stenosis Upper chest: Negative. Other: Partially imaged endotracheal and enteric tubes in place. IMPRESSION: 1. No CT evidence of intracranial injury. 2. 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 If there is clinical concern for an acute infarct, consider further evaluation with MRI. 3. Soft tissue swelling along the left frontal scalp. No evidence of an underlying calvarial fracture. 4. No acute fracture or traumatic subluxation of the cervical spine. Electronically Signed   By:  Lorenza Cambridge M.D.   On: 01/26/2024 10:46   DG Chest Port 1 View Result Date: 01/26/2024 CLINICAL DATA:  Sepsis. EXAM: PORTABLE CHEST 1 VIEW COMPARISON:  Chest radiograph dated 07/20/2021. FINDINGS: Shallow inspiration with minimal bibasilar atelectasis. No focal consolidation, pleural effusion, or pneumothorax. Mild cardiomegaly with mild central vascular congestion. No acute osseous pathology. IMPRESSION: Mild cardiomegaly with mild central vascular congestion. No focal consolidation. Electronically Signed   By: Elgie Collard M.D.   On: 01/26/2024 10:14     Medications:    cefTRIAXone (ROCEPHIN)  IV     insulin 1.3 Units/hr (01/27/24 0916)   levETIRAcetam Stopped (01/26/24 2227)   norepinephrine (LEVOPHED) Adult infusion 24 mcg/min (01/27/24 0918)   phenylephrine (NEO-SYNEPHRINE) Adult infusion Stopped (01/26/24 1330)   sodium bicarbonate 150 mEq in sterile water 1,150 mL infusion 50 mL/hr at 01/27/24 6295   vasopressin 0.04 Units/min (01/27/24 0918)    Chlorhexidine Gluconate Cloth  6 each Topical Daily   docusate  100 mg Per Tube BID   Gerhardt's butt cream   Topical BID   heparin  5,000 Units Subcutaneous Q8H   lactulose  20 g Per Tube BID   mupirocin ointment  1 Application Nasal BID   mouth rinse  15 mL Mouth Rinse Q2H   pantoprazole (PROTONIX) IV  40 mg Intravenous Daily   polyethylene glycol  17 g Per Tube Daily   rifaximin  550 mg Per Tube BID   sodium chloride flush  10-40 mL Intracatheter Q12H   dextrose, docusate, fentaNYL (SUBLIMAZE) injection, midazolam, mouth rinse, polyethylene glycol, sodium chloride flush  Assessment/ Plan:  58 y.o. female with past medical history of anemia, depression, diabetes mellitus type 2, liver cirrhosis, GERD, hypertension, obstructive sleep apnea who was admitted on 01/26/2024 for acute hyponatremia, ventricular tachycardia, septic shock, altered mental status.  1.  Severe hyponatremia.  Serum sodium was 115 upon arrival.  Urine sodium  was less than 10.  Patient was initially started on 3% saline and stopped within a short period of time.  Serum sodium now up to 125.  2.  Acute kidney injury/hyperkalemia.  Acute kidney injury likely secondary to hypotension with development of ATN.  Renal function has improved a bit.  CT scan abdomen pelvis was negative for renal obstruction.  Serum potassium down to 4.7.  3.  Acute metabolic acidosis.  Serum bicarbonate 21.  Okay to continue sodium bicarbonate drip for 1 additional day.   LOS: 1 Johnaton Sonneborn 2/26/202510:29 AM

## 2024-01-27 NOTE — Plan of Care (Signed)

## 2024-01-27 NOTE — Consult Note (Signed)
 [facility]    Date of Admission:  01/26/2024   Total days of inpatient antibiotics 1        Reason for Consult: PsA infection    Principal Problem:   Septic shock [facility])   Assessment: 58 year old female with history of sleep disorder on Ambien, MDD followed by psychiatry, social anxiety disorder, psoriasis, cirrhosis was found down by family admitted with #Klebsiella oxytoca bacteremia secondary possible  PNA  #Cirrhosis with portal hypertension - Patient was hypotensive unresponsive, mechanically intubated on arrival. - Blood cultures grew Klebsiella oxytoca.  UA showed small leukocytes, negative nitrites.  CT abdomen pelvis showed cirrhosis with portal hypertension, no radiographic evidence of urinary tract infection noted on imaging.  Small volume abdominal pelvic ascites noted.  Trace left pleural effusion with adjacent primary consolidation. - RVP negative.  CT head noted soft tissue swelling of left scalp.  Age-indeterminate left cerebral infarct.  Recommendations:  -Follow-up blood cultures - DC vancomycin azithromycin -continue ceftriaxone -Respiratory cultures are incubating, follow. Pt repots a week of non productive cough prior to admission. She is now extubated and able to answer question.  -Pt is jaundiced, but minimal ascites on exam. Will get acute hep panel -Follow urine Cx   Evaluation of this patient requires complex antimicrobial therapy evaluation and counseling + isolation needs for disease transmission risk assessment and mitigation   Microbiology:   Antibiotics: Ceftriaxone 2/25- Azithromycin 2/25- Refaxamin 2/25 Vancomycin 2/25  Cultures: Blood 2/25 1/2 Klebsiella  oxytoca Urine 2/25  Other 2/25 Rsp Cx re-incuabting  HPI: Abigail Molina is a 58 y.o. female with past medical history of MDD, social anxiety disorder followed by psychiatry on citalopram, Ambien for sleep disorder, mirtazapine and bupropion,  endometrial cancer, diabetes mellitus, hypertension hyperlipidemia psoriasis, cirrhosis of liver NASH, chronic pain presented to ED via EMS after found unresponsive at home by family.  She was started on broad-spectrum antibiotics.  She was hypotensive requiring mechanical intubation. Imaging including CT head which showed indeterminate lobe left cerebral infarct, cyst soft tissue swelling along left frontal scalp, CT abdomen pelvis showed cirrhosis with portal hypertension, small volume abdominal pelvic ascites, trace left pleural effusion early pneumonia versus atelectasis.  Blood cultures grew Klebsiella oxytoca.  Infectious disease engaged. Review of Systems: Review of Systems  All other systems reviewed and are negative.   Past Medical History:  Diagnosis Date   Anemia    Anxiety    Arthritis    Cancer (HCC)    Endometrial cancer   Chronic pain    Cirrhosis of liver (HCC)    Depression    Diabetes mellitus without complication (HCC)    Fatty liver disease, nonalcoholic 2021   GERD (gastroesophageal reflux disease)    Glaucoma    Hyperlipidemia    Hypertension    Meningioma (HCC)    OSA (obstructive sleep apnea) 05/08/2023   Overactive bladder    Pharmacologic therapy 11/02/2023   Psoriasis 2015    Social History   Tobacco Use   Smoking status: Never   Smokeless tobacco: Never  Vaping Use   Vaping status: Never Used  Substance Use Topics   Alcohol use: Not Currently    Comment:  Rarely. One or two drinks a year    Drug use: No    Family History  Problem Relation Age of Onset   Diabetes Mother    Hypertension Mother    Depression Mother    Obesity Mother  Vision loss Mother    Alcohol abuse Cousin    Bipolar disorder Cousin    Heart disease Maternal Grandfather    Alcohol abuse Maternal Grandmother    Stroke Paternal Grandfather    Scheduled Meds:  aspirin  81 mg Oral Daily   Chlorhexidine Gluconate Cloth  6 each Topical Daily   docusate  100 mg Per Tube  BID   Gerhardt's butt cream   Topical BID   heparin  5,000 Units Subcutaneous Q8H   hydrocortisone sod succinate (SOLU-CORTEF) inj  50 mg Intravenous Q6H   lactulose  20 g Per Tube BID   mupirocin ointment  1 Application Nasal BID   mouth rinse  15 mL Mouth Rinse Q2H   pantoprazole (PROTONIX) IV  40 mg Intravenous Daily   polyethylene glycol  17 g Per Tube Daily   rifaximin  550 mg Per Tube BID   rosuvastatin  20 mg Oral Daily   sodium chloride flush  10-40 mL Intracatheter Q12H   Continuous Infusions:  cefTRIAXone (ROCEPHIN)  IV Stopped (01/27/24 1216)   insulin 2.2 Units/hr (01/27/24 1220)   levETIRAcetam Stopped (01/27/24 1155)   norepinephrine (LEVOPHED) Adult infusion 34 mcg/min (01/27/24 1220)   sodium bicarbonate 150 mEq in sterile water 1,150 mL infusion 50 mL/hr at 01/27/24 1220   vasopressin 0.04 Units/min (01/27/24 1220)   PRN Meds:.dextrose, docusate, fentaNYL (SUBLIMAZE) injection, midazolam, mouth rinse, polyethylene glycol, polyvinyl alcohol, sodium chloride flush No Known Allergies  OBJECTIVE: Blood pressure (!) 120/40, pulse 97, temperature 98.3 F (36.8 C), temperature source Oral, resp. rate 18, height 5\' 4"  (1.626 m), weight 89.9 kg, last menstrual period 10/15/2016, SpO2 97%.  Physical Exam Constitutional:      Appearance: Normal appearance.  HENT:     Head: Normocephalic and atraumatic.     Right Ear: Tympanic membrane normal.     Left Ear: Tympanic membrane normal.     Nose: Nose normal.     Mouth/Throat:     Mouth: Mucous membranes are moist.  Eyes:     General: Scleral icterus present.     Extraocular Movements: Extraocular movements intact.     Pupils: Pupils are equal, round, and reactive to light.  Cardiovascular:     Rate and Rhythm: Normal rate and regular rhythm.     Heart sounds: No murmur heard.    No friction rub. No gallop.  Pulmonary:     Effort: Pulmonary effort is normal.     Breath sounds: Normal breath sounds.  Musculoskeletal:         General: Normal range of motion.  Skin:    General: Skin is warm and dry.     Coloration: Skin is jaundiced.  Neurological:     General: No focal deficit present.     Mental Status: She is alert and oriented to person, place, and time.  Psychiatric:        Mood and Affect: Mood normal.     Lab Results Lab Results  Component Value Date   WBC 50.9 (HH) 01/27/2024   HGB 8.2 (L) 01/27/2024   HCT 24.9 (L) 01/27/2024   MCV 81.4 01/27/2024   PLT 416 (H) 01/27/2024    Lab Results  Component Value Date   CREATININE 1.47 (H) 01/27/2024   BUN 54 (H) 01/27/2024   NA 125 (L) 01/27/2024   K 4.7 01/27/2024   CL 93 (L) 01/27/2024   CO2 21 (L) 01/27/2024    Lab Results  Component Value Date   ALT  26 01/26/2024   AST 71 (H) 01/26/2024   ALKPHOS 204 (H) 01/26/2024   BILITOT 5.1 (H) 01/26/2024       Danelle Earthly, MD [facility] 01/27/2024, 12:54 PM

## 2024-01-27 NOTE — Progress Notes (Signed)
 PHARMACY CONSULT NOTE - FOLLOW UP  Pharmacy Consult for Electrolyte Monitoring and Replacement   Recent Labs: Potassium (mmol/L)  Date Value  01/27/2024 4.7   Magnesium (mg/dL)  Date Value  16/09/9603 2.3   Calcium (mg/dL)  Date Value  54/08/8118 8.9   Albumin (g/dL)  Date Value  14/78/2956 1.9 (L)  11/17/2018 3.9   Phosphorus (mg/dL)  Date Value  21/30/8657 3.3   Sodium (mmol/L)  Date Value  01/27/2024 125 (L)  11/17/2018 141   Assessment: Patient is a 58 year old female with a past medical history of chronic pain syndrome, cirrhosis, and diabetes who was found unresponsive on her couch this morning. Pharmacy has been consulted to monitor and replace electrolytes as needed.  Goal of Therapy:  Electrolytes WNL Today, 01/27/2024 K = 4.7 Mg = 2.3 Phos = 3.3  Plan:  No replacement indicated at this time Continue to monitor electrolytes daily  Effie Shy, PharmD Pharmacy Resident  01/27/2024 7:01 AM

## 2024-01-28 DIAGNOSIS — N390 Urinary tract infection, site not specified: Secondary | ICD-10-CM | POA: Diagnosis not present

## 2024-01-28 DIAGNOSIS — A419 Sepsis, unspecified organism: Secondary | ICD-10-CM | POA: Diagnosis not present

## 2024-01-28 DIAGNOSIS — B961 Klebsiella pneumoniae [K. pneumoniae] as the cause of diseases classified elsewhere: Secondary | ICD-10-CM | POA: Diagnosis not present

## 2024-01-28 DIAGNOSIS — J189 Pneumonia, unspecified organism: Secondary | ICD-10-CM

## 2024-01-28 DIAGNOSIS — R6521 Severe sepsis with septic shock: Secondary | ICD-10-CM | POA: Diagnosis not present

## 2024-01-28 LAB — HEMOGLOBIN AND HEMATOCRIT, BLOOD
HCT: 21 % — ABNORMAL LOW (ref 36.0–46.0)
HCT: 21 % — ABNORMAL LOW (ref 36.0–46.0)
HCT: 24.5 % — ABNORMAL LOW (ref 36.0–46.0)
Hemoglobin: 6.8 g/dL — ABNORMAL LOW (ref 12.0–15.0)
Hemoglobin: 6.8 g/dL — ABNORMAL LOW (ref 12.0–15.0)
Hemoglobin: 8.1 g/dL — ABNORMAL LOW (ref 12.0–15.0)

## 2024-01-28 LAB — LACTIC ACID, PLASMA
Lactic Acid, Venous: 2.5 mmol/L (ref 0.5–1.9)
Lactic Acid, Venous: 2 mmol/L (ref 0.5–1.9)
Lactic Acid, Venous: 2.5 mmol/L (ref 0.5–1.9)

## 2024-01-28 LAB — RENAL FUNCTION PANEL
Albumin: 2.5 g/dL — ABNORMAL LOW (ref 3.5–5.0)
Anion gap: 11 (ref 5–15)
BUN: 49 mg/dL — ABNORMAL HIGH (ref 6–20)
CO2: 23 mmol/L (ref 22–32)
Calcium: 8.5 mg/dL — ABNORMAL LOW (ref 8.9–10.3)
Chloride: 87 mmol/L — ABNORMAL LOW (ref 98–111)
Creatinine, Ser: 1.19 mg/dL — ABNORMAL HIGH (ref 0.44–1.00)
GFR, Estimated: 53 mL/min — ABNORMAL LOW (ref >60)
Glucose, Bld: 151 mg/dL — ABNORMAL HIGH (ref 70–99)
Phosphorus: 2.3 mg/dL — ABNORMAL LOW (ref 2.5–4.6)
Potassium: 4.2 mmol/L (ref 3.5–5.1)
Sodium: 121 mmol/L — ABNORMAL LOW (ref 135–145)

## 2024-01-28 LAB — PROCALCITONIN: Procalcitonin: 5.14 ng/mL

## 2024-01-28 LAB — URINE CULTURE

## 2024-01-28 LAB — CBC
HCT: 20.5 % — ABNORMAL LOW (ref 36.0–46.0)
Hemoglobin: 6.6 g/dL — ABNORMAL LOW (ref 12.0–15.0)
MCH: 25.6 pg — ABNORMAL LOW (ref 26.0–34.0)
MCHC: 32.2 g/dL (ref 30.0–36.0)
MCV: 79.5 fL — ABNORMAL LOW (ref 80.0–100.0)
Platelets: 243 10*3/uL (ref 150–400)
RBC: 2.58 MIL/uL — ABNORMAL LOW (ref 3.87–5.11)
RDW: 19.5 % — ABNORMAL HIGH (ref 11.5–15.5)
WBC: 25.6 10*3/uL — ABNORMAL HIGH (ref 4.0–10.5)
nRBC: 0.1 % (ref 0.0–0.2)

## 2024-01-28 LAB — GLUCOSE, CAPILLARY
Glucose-Capillary: 140 mg/dL — ABNORMAL HIGH (ref 70–99)
Glucose-Capillary: 141 mg/dL — ABNORMAL HIGH (ref 70–99)
Glucose-Capillary: 149 mg/dL — ABNORMAL HIGH (ref 70–99)
Glucose-Capillary: 149 mg/dL — ABNORMAL HIGH (ref 70–99)
Glucose-Capillary: 171 mg/dL — ABNORMAL HIGH (ref 70–99)
Glucose-Capillary: 178 mg/dL — ABNORMAL HIGH (ref 70–99)
Glucose-Capillary: 196 mg/dL — ABNORMAL HIGH (ref 70–99)
Glucose-Capillary: 263 mg/dL — ABNORMAL HIGH (ref 70–99)
Glucose-Capillary: 317 mg/dL — ABNORMAL HIGH (ref 70–99)
Glucose-Capillary: 326 mg/dL — ABNORMAL HIGH (ref 70–99)
Glucose-Capillary: 378 mg/dL — ABNORMAL HIGH (ref 70–99)

## 2024-01-28 LAB — SODIUM
Sodium: 122 mmol/L — ABNORMAL LOW (ref 135–145)
Sodium: 126 mmol/L — ABNORMAL LOW (ref 135–145)
Sodium: 122 mmol/L — ABNORMAL LOW (ref 135–145)
Sodium: 124 mmol/L — ABNORMAL LOW (ref 135–145)
Sodium: 126 mmol/L — ABNORMAL LOW (ref 135–145)

## 2024-01-28 LAB — OSMOLALITY: Osmolality: 281 mosm/kg (ref 275–295)

## 2024-01-28 LAB — ABO/RH: ABO/RH(D): A POS

## 2024-01-28 LAB — SODIUM, URINE, RANDOM: Sodium, Ur: <10 mmol/L

## 2024-01-28 LAB — MAGNESIUM: Magnesium: 2.3 mg/dL (ref 1.7–2.4)

## 2024-01-28 LAB — PREPARE RBC (CROSSMATCH)

## 2024-01-28 LAB — HEPARIN LEVEL (UNFRACTIONATED): Heparin Unfractionated: 0.43 [IU]/mL (ref 0.30–0.70)

## 2024-01-28 LAB — OSMOLALITY, URINE: Osmolality, Ur: 509 mosm/kg (ref 300–900)

## 2024-01-28 MED ORDER — SODIUM CHLORIDE 0.9% IV SOLUTION: Freq: Once | INTRAVENOUS | Status: AC

## 2024-01-28 MED ORDER — INSULIN ASPART 100 UNIT/ML IJ SOLN
0.0000 [IU] | INTRAMUSCULAR | Status: DC
  Administered 2024-01-28: 15 [IU] via SUBCUTANEOUS
  Administered 2024-01-29 (×3): 4 [IU] via SUBCUTANEOUS
  Administered 2024-01-29: 15 [IU] via SUBCUTANEOUS
  Administered 2024-01-29: 7 [IU] via SUBCUTANEOUS
  Administered 2024-01-29 – 2024-01-30 (×2): 4 [IU] via SUBCUTANEOUS
  Administered 2024-01-30: 11 [IU] via SUBCUTANEOUS
  Administered 2024-01-30: 4 [IU] via SUBCUTANEOUS
  Administered 2024-01-30: 11 [IU] via SUBCUTANEOUS
  Administered 2024-01-30 – 2024-01-31 (×6): 4 [IU] via SUBCUTANEOUS
  Administered 2024-02-01 (×2): 11 [IU] via SUBCUTANEOUS
  Administered 2024-02-01: 4 [IU] via SUBCUTANEOUS
  Administered 2024-02-01 – 2024-02-02 (×2): 7 [IU] via SUBCUTANEOUS
  Administered 2024-02-02 (×2): 4 [IU] via SUBCUTANEOUS
  Administered 2024-02-02: 7 [IU] via SUBCUTANEOUS
  Administered 2024-02-02: 3 [IU] via SUBCUTANEOUS
  Administered 2024-02-03: 7 [IU] via SUBCUTANEOUS
  Administered 2024-02-03: 4 [IU] via SUBCUTANEOUS
  Administered 2024-02-03: 7 [IU] via SUBCUTANEOUS
  Administered 2024-02-04: 4 [IU] via SUBCUTANEOUS
  Administered 2024-02-04: 11 [IU] via SUBCUTANEOUS
  Administered 2024-02-04 (×2): 7 [IU] via SUBCUTANEOUS
  Administered 2024-02-05: 11 [IU] via SUBCUTANEOUS
  Administered 2024-02-05: 15 [IU] via SUBCUTANEOUS
  Administered 2024-02-05: 3 [IU] via SUBCUTANEOUS
  Administered 2024-02-05: 7 [IU] via SUBCUTANEOUS
  Administered 2024-02-06: 11 [IU] via SUBCUTANEOUS
  Administered 2024-02-06: 7 [IU] via SUBCUTANEOUS
  Administered 2024-02-06: 4 [IU] via SUBCUTANEOUS
  Administered 2024-02-06: 11 [IU] via SUBCUTANEOUS
  Administered 2024-02-07: 3 [IU] via SUBCUTANEOUS
  Administered 2024-02-07: 4 [IU] via SUBCUTANEOUS
  Administered 2024-02-07: 7 [IU] via SUBCUTANEOUS
  Administered 2024-02-07: 11 [IU] via SUBCUTANEOUS
  Administered 2024-02-07: 3 [IU] via SUBCUTANEOUS
  Filled 2024-01-28 (×45): qty 1

## 2024-01-28 MED ORDER — MIDODRINE HCL 5 MG PO TABS
10.0000 mg | ORAL_TABLET | Freq: Three times a day (TID) | ORAL | Status: DC
  Administered 2024-01-28 – 2024-02-08 (×34): 10 mg via ORAL
  Filled 2024-01-28 (×34): qty 2

## 2024-01-28 MED ORDER — INSULIN GLARGINE-YFGN 100 UNIT/ML ~~LOC~~ SOLN
26.0000 [IU] | Freq: Two times a day (BID) | SUBCUTANEOUS | Status: DC
  Filled 2024-01-28: qty 0.26

## 2024-01-28 MED ORDER — PANTOPRAZOLE SODIUM 40 MG IV SOLR
40.0000 mg | Freq: Two times a day (BID) | INTRAVENOUS | Status: DC
  Administered 2024-01-28 – 2024-02-01 (×10): 40 mg via INTRAVENOUS
  Filled 2024-01-28 (×10): qty 10

## 2024-01-28 MED ORDER — LEVETIRACETAM 750 MG PO TABS
750.0000 mg | ORAL_TABLET | Freq: Two times a day (BID) | ORAL | Status: DC
  Administered 2024-01-28 – 2024-02-08 (×22): 750 mg via ORAL
  Filled 2024-01-28 (×22): qty 1

## 2024-01-28 MED ORDER — INSULIN ASPART 100 UNIT/ML IJ SOLN
10.0000 [IU] | Freq: Once | INTRAMUSCULAR | Status: AC
  Administered 2024-01-28: 10 [IU] via SUBCUTANEOUS
  Filled 2024-01-28: qty 1

## 2024-01-28 MED ORDER — INSULIN ASPART 100 UNIT/ML IJ SOLN
5.0000 [IU] | Freq: Three times a day (TID) | INTRAMUSCULAR | Status: DC
  Administered 2024-01-29 – 2024-02-07 (×26): 5 [IU] via SUBCUTANEOUS
  Filled 2024-01-28 (×27): qty 1

## 2024-01-28 MED ORDER — INSULIN GLARGINE-YFGN 100 UNIT/ML ~~LOC~~ SOLN
26.0000 [IU] | Freq: Two times a day (BID) | SUBCUTANEOUS | Status: DC
Start: 2024-01-28 — End: 2024-01-28
  Administered 2024-01-28: 26 [IU] via SUBCUTANEOUS
  Filled 2024-01-28: qty 0.26

## 2024-01-28 MED ORDER — INSULIN ASPART 100 UNIT/ML IJ SOLN
3.0000 [IU] | INTRAMUSCULAR | Status: DC
  Administered 2024-01-28: 3 [IU] via SUBCUTANEOUS
  Filled 2024-01-28: qty 1

## 2024-01-28 MED ORDER — INSULIN ASPART 100 UNIT/ML IJ SOLN
0.0000 [IU] | Freq: Every day | INTRAMUSCULAR | Status: DC
Start: 2024-01-28 — End: 2024-01-28

## 2024-01-28 MED ORDER — PANTOPRAZOLE SODIUM 40 MG PO TBEC
40.0000 mg | DELAYED_RELEASE_TABLET | Freq: Every day | ORAL | Status: DC
  Administered 2024-01-28: 40 mg via ORAL
  Filled 2024-01-28: qty 1

## 2024-01-28 MED ORDER — ALBUMIN HUMAN 5 % IV SOLN
25.0000 g | Freq: Once | INTRAVENOUS | Status: AC
  Administered 2024-01-28: 25 g via INTRAVENOUS
  Filled 2024-01-28: qty 500

## 2024-01-28 MED ORDER — INSULIN ASPART 100 UNIT/ML IJ SOLN
0.0000 [IU] | Freq: Three times a day (TID) | INTRAMUSCULAR | Status: DC
  Administered 2024-01-28: 20 [IU] via SUBCUTANEOUS
  Filled 2024-01-28: qty 1

## 2024-01-28 MED ORDER — INSULIN GLARGINE 100 UNIT/ML ~~LOC~~ SOLN
26.0000 [IU] | Freq: Every day | SUBCUTANEOUS | Status: DC
Start: 2024-01-28 — End: 2024-02-06
  Administered 2024-01-28 – 2024-02-05 (×9): 26 [IU] via SUBCUTANEOUS
  Filled 2024-01-28 (×9): qty 0.26

## 2024-01-28 MED ORDER — K PHOS MONO-SOD PHOS DI & MONO 155-852-130 MG PO TABS
500.0000 mg | ORAL_TABLET | Freq: Once | ORAL | Status: AC
  Administered 2024-01-28: 500 mg via ORAL
  Filled 2024-01-28: qty 2

## 2024-01-28 MED ORDER — SODIUM CHLORIDE 3 % IV SOLN
INTRAVENOUS | Status: DC
  Filled 2024-01-28 (×3): qty 500

## 2024-01-28 NOTE — Progress Notes (Addendum)
 [facility]  ROUNDING NOTE   Subjective:   Patient seen sitting up in bed Currently eating breakfast  Sodium 121, receiving multiple IV medications.  May be diluting sodium levels. Levophed, vasopressin Insulin drip Sodium bicarb infusion  Creatinine 1.19 Urine output 2480 mL  Objective:  Vital signs in last 24 hours:  Temp:  [97.6 F (36.4 C)-99.1 F (37.3 C)] 97.6 F (36.4 C) (02/27 0830) Pulse Rate:  [48-199] 100 (02/27 0830) Resp:  [9-24] 19 (02/27 0830) BP: (85-123)/(34-69) 112/63 (02/27 0830) SpO2:  [88 %-100 %] 94 % (02/27 0800) Weight:  [90.3 kg] 90.3 kg (02/27 0400)  Weight change: 1.9 kg Filed Weights   01/26/24 1445 01/27/24 0400 01/28/24 0400  Weight: 88.4 kg 89.9 kg 90.3 kg    Intake/Output: I/O last 3 completed shifts: In: 5823.2 [P.O.:1000; I.V.:3517; IV Piggyback:1306.2] Out: 4790 [Urine:4740; Emesis/NG output:50]   Intake/Output this shift:  Total I/O In: 588.4 [I.V.:156.8; Blood:342.5; IV Piggyback:89.1] Out: -   Physical Exam: General: No acute distress  Head: Normocephalic, atraumatic. Moist oral mucosal membranes  Neck: Supple  Lungs:  Diminished, normal effort  Heart: S1S2 no rubs  Abdomen:  Soft, nontender, bowel sounds present  Extremities: Trace peripheral edema.  Neurologic: Awake, alert, following commands  Skin: No acute rash  Access: No hemodialysis access    Basic Metabolic Panel: Recent Labs  Lab 01/26/24 1153 01/26/24 1213 01/26/24 1554 01/26/24 2038 01/27/24 0010 01/27/24 0414 01/28/24 0454 01/28/24 1008  NA  --  119*  --  121* 122* 125* 121* 122*  K  --  6.6* 6.2* 5.9* 5.4* 4.7 4.2  --   CL  --  89*  --  90* 90* 93* 87*  --   CO2  --  17*  --  19* 19* 21* 23  --   GLUCOSE  --  297*  --  347* 327* 151* 151*  --   BUN  --  61*  --  57* 57* 54* 49*  --   CREATININE  --  2.05*  --  1.78* 1.69* 1.47* 1.19*  --   CALCIUM  --  8.3*  --  8.9 8.9 8.9 8.5*  --   MG 2.1  --   --   --   --  2.3 2.3  --    PHOS 5.1*  --   --   --   --  3.3 2.3*  --     Liver Function Tests: Recent Labs  Lab 01/26/24 0834 01/28/24 0454  AST 71*  --   ALT 26  --   ALKPHOS 204*  --   BILITOT 5.1*  --   PROT 6.4*  --   ALBUMIN 1.9* 2.5*   No results for input(s): "LIPASE", "AMYLASE" in the last 168 hours. Recent Labs  Lab 01/26/24 0834 01/26/24 1213  AMMONIA 52* 56*    CBC: Recent Labs  Lab 01/26/24 0834 01/26/24 2038 01/27/24 0414 01/28/24 0454  WBC 40.9* 58.6* 50.9* 25.6*  NEUTROABS 36.1* 53.3* 43.5*  --   HGB 7.2* 8.0* 8.2* 6.6*  HCT 21.9* 25.2* 24.9* 20.5*  MCV 80.8 82.6 81.4 79.5*  PLT 185 385 416* 243    Cardiac Enzymes: No results for input(s): "CKTOTAL", "CKMB", "CKMBINDEX", "TROPONINI" in the last 168 hours.  BNP: Invalid input(s): "POCBNP"  CBG: Recent Labs  Lab 01/28/24 0246 01/28/24 0346 01/28/24 0459 01/28/24 0600 01/28/24 0814  GLUCAP 171* 149* 149* 140* 141*    Microbiology: Results for orders placed or performed during  the hospital encounter of 01/26/24  Resp panel by RT-PCR (RSV, Flu A&B, Covid) Anterior Nasal Swab     Status: None   Collection Time: 01/26/24  8:34 AM   Specimen: Anterior Nasal Swab  Result Value Ref Range Status   SARS Coronavirus 2 by RT PCR NEGATIVE NEGATIVE Final    Comment: (NOTE) SARS-CoV-2 target nucleic acids are NOT DETECTED.  The SARS-CoV-2 RNA is generally detectable in upper respiratory specimens during the acute phase of infection. The lowest concentration of SARS-CoV-2 viral copies this assay can detect is 138 copies/mL. A negative result does not preclude SARS-Cov-2 infection and should not be used as the sole basis for treatment or other patient management decisions. A negative result may occur with  improper specimen collection/handling, submission of specimen other than nasopharyngeal swab, presence of viral mutation(s) within the areas targeted by this assay, and inadequate number of viral copies(<138  copies/mL). A negative result must be combined with clinical observations, patient history, and epidemiological information. The expected result is Negative.  Fact Sheet for Patients:  BloggerCourse.com  Fact Sheet for Healthcare Providers:  SeriousBroker.it  This test is no t yet approved or cleared by the [place] FDA and  has been authorized for detection and/or diagnosis of SARS-CoV-2 by FDA under an Emergency Use Authorization (EUA). This EUA will remain  in effect (meaning this test can be used) for the duration of the COVID-19 declaration under Section 564(b)(1) of the Act, 21 U.S.C.section 360bbb-3(b)(1), unless the authorization is terminated  or revoked sooner.       Influenza A by PCR NEGATIVE NEGATIVE Final   Influenza B by PCR NEGATIVE NEGATIVE Final    Comment: (NOTE) The Xpert Xpress SARS-CoV-2/FLU/RSV plus assay is intended as an aid in the diagnosis of influenza from Nasopharyngeal swab specimens and should not be used as a sole basis for treatment. Nasal washings and aspirates are unacceptable for Xpert Xpress SARS-CoV-2/FLU/RSV testing.  Fact Sheet for Patients: BloggerCourse.com  Fact Sheet for Healthcare Providers: SeriousBroker.it  This test is not yet approved or cleared by the [place] FDA and has been authorized for detection and/or diagnosis of SARS-CoV-2 by FDA under an Emergency Use Authorization (EUA). This EUA will remain in effect (meaning this test can be used) for the duration of the COVID-19 declaration under Section 564(b)(1) of the Act, 21 U.S.C. section 360bbb-3(b)(1), unless the authorization is terminated or revoked.     Resp Syncytial Virus by PCR NEGATIVE NEGATIVE Final    Comment: (NOTE) Fact Sheet for Patients: BloggerCourse.com  Fact Sheet for Healthcare  Providers: SeriousBroker.it  This test is not yet approved or cleared by the [place] FDA and has been authorized for detection and/or diagnosis of SARS-CoV-2 by FDA under an Emergency Use Authorization (EUA). This EUA will remain in effect (meaning this test can be used) for the duration of the COVID-19 declaration under Section 564(b)(1) of the Act, 21 U.S.C. section 360bbb-3(b)(1), unless the authorization is terminated or revoked.  Performed at [facility], [address] [place], [place] [address]   Blood Culture (routine x 2)     Status: Abnormal (Preliminary result)   Collection Time: 01/26/24  8:34 AM   Specimen: BLOOD  Result Value Ref Range Status   Specimen Description   Final    BLOOD RIGHT [facility] Performed at [facility], [address], [place], [place] [address]    Special Requests   Final    BOTTLES DRAWN AEROBIC AND ANAEROBIC Blood Culture  results may not be optimal due to an inadequate volume of blood received in culture bottles Performed at [facility], [address] [place], [place] [address]    Culture  Setup Time   Final    GRAM NEGATIVE RODS IN BOTH AEROBIC AND ANAEROBIC BOTTLES CRITICAL RESULT CALLED TO, READ BACK BY AND VERIFIED WITH: JASON ROBBINS PHARMD @0240  01/27/24 ASW    Culture (A)  Final    KLEBSIELLA OXYTOCA SUSCEPTIBILITIES TO FOLLOW Performed at [facility], [address], [place], [place] [address]    Report Status PENDING  Incomplete  Blood Culture (routine x 2)     Status: None (Preliminary result)   Collection Time: 01/26/24  8:34 AM   Specimen: BLOOD  Result Value Ref Range Status   Specimen Description   Final    BLOOD LEFT ANTECUBITAL Performed at [facility], [address] [place], [place] [address]    Special Requests   Final    BOTTLES DRAWN AEROBIC AND ANAEROBIC Blood Culture results may not be optimal due to an inadequate volume of blood  received in culture bottles Performed at [facility], [address] [place], [place] [address]    Culture  Setup Time   Final    Organism ID to follow GRAM POSITIVE COCCI IN BOTH AEROBIC AND ANAEROBIC BOTTLES CRITICAL RESULT CALLED TO, READ BACK BY AND VERIFIED WITH: EMILY STEINBOCH 01/27/24 1439 KLW    Culture GRAM POSITIVE COCCI  Final   Report Status PENDING  Incomplete  Urine Culture     Status: Abnormal   Collection Time: 01/26/24  8:34 AM   Specimen: Urine, Clean Catch  Result Value Ref Range Status   Specimen Description   Final    URINE, CLEAN CATCH Performed at [facility], [address] [place], [place] [address]    Special Requests   Final    NONE Reflexed from [phone] Performed at [facility], [address] [place], [place] [address]    Culture >=100,000 COLONIES/mL KLEBSIELLA OXYTOCA (A)  Final   Report Status 01/28/2024 FINAL  Final   Organism ID, Bacteria KLEBSIELLA OXYTOCA (A)  Final      Susceptibility   Klebsiella oxytoca - MIC*    AMPICILLIN >=32 RESISTANT Resistant     CEFEPIME <=0.12 SENSITIVE Sensitive     CEFTRIAXONE <=0.25 SENSITIVE Sensitive     CIPROFLOXACIN <=0.25 SENSITIVE Sensitive     GENTAMICIN <=1 SENSITIVE Sensitive     IMIPENEM <=0.25 SENSITIVE Sensitive     NITROFURANTOIN <=16 SENSITIVE Sensitive     TRIMETH/SULFA <=20 SENSITIVE Sensitive     AMPICILLIN/SULBACTAM 8 SENSITIVE Sensitive     PIP/TAZO <=4 SENSITIVE Sensitive ug/mL    * >=100,000 COLONIES/mL KLEBSIELLA OXYTOCA  Blood Culture ID Panel (Reflexed)     Status: Abnormal   Collection Time: 01/26/24  8:34 AM  Result Value Ref Range Status   Enterococcus faecalis NOT DETECTED NOT DETECTED Final   Enterococcus Faecium NOT DETECTED NOT DETECTED Final   Listeria monocytogenes NOT DETECTED NOT DETECTED Final   Staphylococcus species NOT DETECTED NOT DETECTED Final   Staphylococcus aureus (BCID) NOT DETECTED NOT DETECTED Final   Staphylococcus  epidermidis NOT DETECTED NOT DETECTED Final   Staphylococcus lugdunensis NOT DETECTED NOT DETECTED Final   Streptococcus species NOT DETECTED NOT DETECTED Final   Streptococcus agalactiae NOT DETECTED NOT DETECTED Final   Streptococcus pneumoniae NOT DETECTED NOT DETECTED Final   Streptococcus pyogenes NOT DETECTED NOT DETECTED  Final   A.calcoaceticus-baumannii NOT DETECTED NOT DETECTED Final   Bacteroides fragilis NOT DETECTED NOT DETECTED Final   Enterobacterales DETECTED (A) NOT DETECTED Final    Comment: Enterobacterales represent a large order of gram negative bacteria, not a single organism. CRITICAL RESULT CALLED TO, READ BACK BY AND VERIFIED WITH: JASON ROBBINS PHARMD @0240  01/27/24 ASW    Enterobacter cloacae complex NOT DETECTED NOT DETECTED Final   Escherichia coli NOT DETECTED NOT DETECTED Final   Klebsiella aerogenes NOT DETECTED NOT DETECTED Final   Klebsiella oxytoca DETECTED (A) NOT DETECTED Final    Comment: CRITICAL RESULT CALLED TO, READ BACK BY AND VERIFIED WITH: JASON ROBBINS PHARMD @0240  01/27/24 ASW    Klebsiella pneumoniae NOT DETECTED NOT DETECTED Final   Proteus species NOT DETECTED NOT DETECTED Final   Salmonella species NOT DETECTED NOT DETECTED Final   Serratia marcescens NOT DETECTED NOT DETECTED Final   Haemophilus influenzae NOT DETECTED NOT DETECTED Final   Neisseria meningitidis NOT DETECTED NOT DETECTED Final   Pseudomonas aeruginosa NOT DETECTED NOT DETECTED Final   Stenotrophomonas maltophilia NOT DETECTED NOT DETECTED Final   Candida albicans NOT DETECTED NOT DETECTED Final   Candida auris NOT DETECTED NOT DETECTED Final   Candida glabrata NOT DETECTED NOT DETECTED Final   Candida krusei NOT DETECTED NOT DETECTED Final   Candida parapsilosis NOT DETECTED NOT DETECTED Final   Candida tropicalis NOT DETECTED NOT DETECTED Final   Cryptococcus neoformans/gattii NOT DETECTED NOT DETECTED Final   CTX-M ESBL NOT DETECTED NOT DETECTED Final   Carbapenem  resistance IMP NOT DETECTED NOT DETECTED Final   Carbapenem resistance KPC NOT DETECTED NOT DETECTED Final   Carbapenem resistance NDM NOT DETECTED NOT DETECTED Final   Carbapenem resist OXA 48 LIKE NOT DETECTED NOT DETECTED Final   Carbapenem resistance VIM NOT DETECTED NOT DETECTED Final    Comment: Performed at [facility], [address] [place], [place] [address]  Blood Culture ID Panel (Reflexed)     Status: Abnormal   Collection Time: 01/26/24  8:34 AM  Result Value Ref Range Status   Enterococcus faecalis NOT DETECTED NOT DETECTED Final   Enterococcus Faecium NOT DETECTED NOT DETECTED Final   Listeria monocytogenes NOT DETECTED NOT DETECTED Final   Staphylococcus species DETECTED (A) NOT DETECTED Final    Comment: CRITICAL RESULT CALLED TO, READ BACK BY AND VERIFIED WITH: EMILY STEINBOCH 01/27/24 1439 KLW    Staphylococcus aureus (BCID) NOT DETECTED NOT DETECTED Final   Staphylococcus epidermidis DETECTED (A) NOT DETECTED Final    Comment: Methicillin (oxacillin) resistant coagulase negative staphylococcus. Possible blood culture contaminant (unless isolated from more than one blood culture draw or clinical case suggests pathogenicity). No antibiotic treatment is indicated for blood  culture contaminants. CRITICAL RESULT CALLED TO, READ BACK BY AND VERIFIED WITH: EMILY STEINBOCH 01/27/24 1439 KLW    Staphylococcus lugdunensis NOT DETECTED NOT DETECTED Final   Streptococcus species NOT DETECTED NOT DETECTED Final   Streptococcus agalactiae NOT DETECTED NOT DETECTED Final   Streptococcus pneumoniae NOT DETECTED NOT DETECTED Final   Streptococcus pyogenes NOT DETECTED NOT DETECTED Final   A.calcoaceticus-baumannii NOT DETECTED NOT DETECTED Final   Bacteroides fragilis NOT DETECTED NOT DETECTED Final   Enterobacterales NOT DETECTED NOT DETECTED Final   Enterobacter cloacae complex NOT DETECTED NOT DETECTED Final   Escherichia coli NOT DETECTED NOT DETECTED Final    Klebsiella aerogenes NOT DETECTED NOT DETECTED Final   Klebsiella oxytoca NOT DETECTED NOT DETECTED Final   Klebsiella pneumoniae NOT DETECTED  NOT DETECTED Final   Proteus species NOT DETECTED NOT DETECTED Final   Salmonella species NOT DETECTED NOT DETECTED Final   Serratia marcescens NOT DETECTED NOT DETECTED Final   Haemophilus influenzae NOT DETECTED NOT DETECTED Final   Neisseria meningitidis NOT DETECTED NOT DETECTED Final   Pseudomonas aeruginosa NOT DETECTED NOT DETECTED Final   Stenotrophomonas maltophilia NOT DETECTED NOT DETECTED Final   Candida albicans NOT DETECTED NOT DETECTED Final   Candida auris NOT DETECTED NOT DETECTED Final   Candida glabrata NOT DETECTED NOT DETECTED Final   Candida krusei NOT DETECTED NOT DETECTED Final   Candida parapsilosis NOT DETECTED NOT DETECTED Final   Candida tropicalis NOT DETECTED NOT DETECTED Final   Cryptococcus neoformans/gattii NOT DETECTED NOT DETECTED Final   Methicillin resistance mecA/C DETECTED (A) NOT DETECTED Final    Comment: CRITICAL RESULT CALLED TO, READ BACK BY AND VERIFIED WITH: EMILY STEINBOCH 01/27/24 1439 KLW Performed at [facility], [address] [place], [place] [address]   MRSA Next Gen by PCR, Nasal     Status: Abnormal   Collection Time: 01/26/24 11:42 AM   Specimen: Nasal Mucosa; Nasal Swab  Result Value Ref Range Status   MRSA by PCR Next Gen DETECTED (A) NOT DETECTED Final    Comment: RESULT CALLED TO, READ BACK BY AND VERIFIED WITH: Claudette Stapler 01/26/24 1315 MW (NOTE) The GeneXpert MRSA Assay (FDA approved for NASAL specimens only), is one component of a comprehensive MRSA colonization surveillance program. It is not intended to diagnose MRSA infection nor to guide or monitor treatment for MRSA infections. Test performance is not FDA approved in patients less than 6 years old. Performed at [facility], [address] [place], [place] [address]   Culture, Respiratory w Gram  Stain     Status: None (Preliminary result)   Collection Time: 01/26/24  2:06 PM   Specimen: Tracheal Aspirate; Respiratory  Result Value Ref Range Status   Specimen Description TRACHEAL ASPIRATE  Final   Special Requests NONE  Final   Gram Stain   Final    RARE WBC PRESENT, PREDOMINANTLY PMN NO ORGANISMS SEEN    Culture   Final    CULTURE REINCUBATED FOR BETTER GROWTH Performed at [facility], [address], [place], [place] [address]    Report Status PENDING  Incomplete  Respiratory (~20 pathogens) panel by PCR     Status: None   Collection Time: 01/26/24  7:00 PM   Specimen: Nasopharyngeal Swab; Respiratory  Result Value Ref Range Status   Adenovirus NOT DETECTED NOT DETECTED Final   Coronavirus 229E NOT DETECTED NOT DETECTED Final    Comment: (NOTE) The Coronavirus on the Respiratory Panel, DOES NOT test for the novel  Coronavirus (2019 nCoV)    Coronavirus HKU1 NOT DETECTED NOT DETECTED Final   Coronavirus NL63 NOT DETECTED NOT DETECTED Final   Coronavirus OC43 NOT DETECTED NOT DETECTED Final   Metapneumovirus NOT DETECTED NOT DETECTED Final   Rhinovirus / Enterovirus NOT DETECTED NOT DETECTED Final   Influenza A NOT DETECTED NOT DETECTED Final   Influenza B NOT DETECTED NOT DETECTED Final   Parainfluenza Virus 1 NOT DETECTED NOT DETECTED Final   Parainfluenza Virus 2 NOT DETECTED NOT DETECTED Final   Parainfluenza Virus 3 NOT DETECTED NOT DETECTED Final   Parainfluenza Virus 4 NOT DETECTED NOT DETECTED Final   Respiratory Syncytial Virus NOT DETECTED NOT DETECTED Final   Bordetella pertussis NOT DETECTED NOT DETECTED Final   Bordetella Parapertussis  NOT DETECTED NOT DETECTED Final   Chlamydophila pneumoniae NOT DETECTED NOT DETECTED Final   Mycoplasma pneumoniae NOT DETECTED NOT DETECTED Final    Comment: Performed at [facility], [address], [place], [place] [address]    Coagulation Studies: Recent Labs    01/26/24 0834  LABPROT 18.8*  INR  1.6*    Urinalysis: Recent Labs    01/26/24 0834  COLORURINE YELLOW*  LABSPEC 1.013  PHURINE 6.0  GLUCOSEU >=500*  HGBUR MODERATE*  BILIRUBINUR NEGATIVE  KETONESUR NEGATIVE  PROTEINUR NEGATIVE  NITRITE NEGATIVE  LEUKOCYTESUR SMALL*      Imaging: EEG adult Result Date: 01/26/2024 Charlsie Quest, MD     01/26/2024  4:46 PM Patient Name: Abigail Molina MRN: [id number] Epilepsy Attending: Charlsie Quest Referring Physician/Provider: Ezequiel Essex, NP Date: 01/26/2024 Duration: 32.31 mins Patient history: 58yo female with ams. EEG to evaluate for seizure Level of alertness: comatose AEDs during EEG study: LEV Technical aspects: This EEG study was done with scalp electrodes positioned according to the 10-20 International system of electrode placement. Electrical activity was reviewed with band pass filter of 1-70Hz , sensitivity of 7 uV/mm, display speed of 76mm/sec with a 60Hz  notched filter applied as appropriate. EEG data were recorded continuously and digitally stored.  Video monitoring was available and reviewed as appropriate. Description: EEG showed continuous generalized 3 to 5 Hz theta-delta slowing. Hyperventilation and photic stimulation were not performed.   ABNORMALITY - Continuous slow, generalized IMPRESSION: This study is suggestive of severe diffuse encephalopathy. No seizures or epileptiform discharges were seen throughout the recording. Charlsie Quest   ECHOCARDIOGRAM COMPLETE Result Date: 01/26/2024    ECHOCARDIOGRAM REPORT   Patient Name:   Abigail Molina Date of Exam: 01/26/2024 Medical Rec #:  [id number]    Height:       64.0 in Accession #:    [id number]   Weight:       212.0 lb Date of Birth:  1966/07/30    BSA:          2.005 m Patient Age:    57 years     BP:           85/65 mmHg Patient Gender: F            HR:           Not listed in chart bpm. Exam Location:  [facility] Procedure: 2D Echo, Cardiac Doppler and Color Doppler (Both Spectral and Color            Flow Doppler were  utilized during procedure). Indications:     Abnormal ECG R94.31  History:         Patient has no prior history of Echocardiogram examinations.                  Risk Factors:Hypertension and Dyslipidemia. Anxiety.  Sonographer:     Cristela Blue Referring Phys:  [id number] Ezequiel Essex Diagnosing Phys: Yvonne Kendall MD  Sonographer Comments: Echo performed with patient supine and on artificial respirator and no parasternal window. Zoll pad over sternum. IMPRESSIONS  1. Left ventricular ejection fraction, by estimation, is >55%. The left ventricle has normal function. Left ventricular endocardial border not optimally defined to evaluate regional wall motion. Left ventricular diastolic parameters were normal.  2. Right ventricular systolic function is normal. The right ventricular size is normal. Tricuspid regurgitation signal is inadequate for assessing PA pressure.  3. The mitral valve is normal in structure. No evidence  of mitral valve regurgitation. No evidence of mitral stenosis.  4. The aortic valve was not well visualized. Aortic valve regurgitation is not visualized. No aortic stenosis is present. FINDINGS  Left Ventricle: Left ventricular ejection fraction, by estimation, is >55%. The left ventricle has normal function. Left ventricular endocardial border not optimally defined to evaluate regional wall motion. Strain imaging was not performed. The left ventricular internal cavity size was normal in size. There is borderline left ventricular hypertrophy. Left ventricular diastolic parameters were normal. Right Ventricle: The right ventricular size is normal. No increase in right ventricular wall thickness. Right ventricular systolic function is normal. Tricuspid regurgitation signal is inadequate for assessing PA pressure. Left Atrium: Left atrial size was normal in size. Right Atrium: Right atrial size was normal in size. Pericardium: There is no evidence of pericardial effusion. Mitral Valve: The mitral valve  is normal in structure. No evidence of mitral valve regurgitation. No evidence of mitral valve stenosis. MV peak gradient, 13.0 mmHg. The mean mitral valve gradient is 4.0 mmHg. Tricuspid Valve: The tricuspid valve is normal in structure. Tricuspid valve regurgitation is trivial. Aortic Valve: The aortic valve was not well visualized. Aortic valve regurgitation is not visualized. No aortic stenosis is present. Aortic valve mean gradient measures 3.0 mmHg. Aortic valve peak gradient measures 5.3 mmHg. Aortic valve area, by VTI measures 3.79 cm. Pulmonic Valve: The pulmonic valve was not well visualized. Pulmonic valve regurgitation is not visualized. No evidence of pulmonic stenosis. Aorta: The aortic root is normal in size and structure. Pulmonary Artery: The pulmonary artery is not well seen. Venous: IVC assessment for right atrial pressure unable to be performed due to mechanical ventilation. IAS/Shunts: No atrial level shunt detected by color flow Doppler. Additional Comments: 3D imaging was not performed.  LEFT VENTRICLE PLAX 2D LVIDd:         3.50 cm   Diastology LVIDs:         2.60 cm   LV e' medial:    12.90 cm/s LV PW:         1.10 cm   LV E/e' medial:  6.0 LV IVS:        1.00 cm   LV e' lateral:   16.90 cm/s LVOT diam:     2.00 cm   LV E/e' lateral: 4.6 LV SV:         63 LV SV Index:   31 LVOT Area:     3.14 cm  RIGHT VENTRICLE RV Basal diam:  3.40 cm RV Mid diam:    3.50 cm RV S prime:     15.30 cm/s TAPSE (M-mode): 2.3 cm LEFT ATRIUM           Index        RIGHT ATRIUM           Index LA diam:      3.30 cm 1.65 cm/m   RA Area:     15.10 cm LA Vol (A2C): 19.1 ml 9.52 ml/m   RA Volume:   33.10 ml  16.51 ml/m LA Vol (A4C): 26.1 ml 13.01 ml/m  AORTIC VALVE AV Area (Vmax):    2.92 cm AV Area (Vmean):   3.13 cm AV Area (VTI):     3.79 cm AV Vmax:           115.00 cm/s AV Vmean:          85.900 cm/s AV VTI:            0.165 m  AV Peak Grad:      5.3 mmHg AV Mean Grad:      3.0 mmHg LVOT Vmax:          107.00 cm/s LVOT Vmean:        85.600 cm/s LVOT VTI:          0.199 m LVOT/AV VTI ratio: 1.21  AORTA Ao Root diam: 3.10 cm MITRAL VALVE MV Area (PHT): 5.75 cm    SHUNTS MV Area VTI:   2.10 cm    Systemic VTI:  0.20 m MV Peak grad:  13.0 mmHg   Systemic Diam: 2.00 cm MV Mean grad:  4.0 mmHg MV Vmax:       1.80 m/s MV Vmean:      88.2 cm/s MV Decel Time: 132 msec MV E velocity: 77.60 cm/s MV A velocity: 93.80 cm/s MV E/A ratio:  0.83 Cristal Deer End MD Electronically signed by Yvonne Kendall MD Signature Date/Time: 01/26/2024/4:06:41 PM    Final    DG Chest Port 1 View Result Date: 01/26/2024 CLINICAL DATA:  Central line readjustment. EXAM: PORTABLE CHEST 1 VIEW COMPARISON:  Chest x-ray from same day at 1247 hours. FINDINGS: The patient remains rotated to the left. Interval retraction of the left internal jugular central venous catheter with the tip now in the distal SVC. Unchanged endotracheal and enteric tubes. Unchanged cardiomegaly and left retrocardiac opacity. No pleural effusion or pneumothorax. No acute osseous abnormality. IMPRESSION: 1. Interval retraction of the left internal jugular central venous catheter with the tip now in the distal SVC. Electronically Signed   By: Obie Dredge M.D.   On: 01/26/2024 14:11   DG Chest Port 1 View Result Date: 01/26/2024 CLINICAL DATA:  Central line placement. EXAM: PORTABLE CHEST 1 VIEW COMPARISON:  Chest x-ray from same day at 0950 hours. FINDINGS: The patient is rotated to the left. New left internal jugular central venous catheter with tip in the proximal right atrium. Unchanged endotracheal and enteric tubes. Unchanged cardiomegaly. Unchanged retrocardiac left lower lobe density. Clear right lung. No large pleural effusion or pneumothorax. No acute osseous abnormality. IMPRESSION: 1. New left internal jugular central venous catheter with tip in the proximal right atrium. No pneumothorax. 2. Unchanged retrocardiac left lower lobe atelectasis or pneumonia.  Electronically Signed   By: Obie Dredge M.D.   On: 01/26/2024 14:10     Medications:    cefTRIAXone (ROCEPHIN)  IV Stopped (01/27/24 1216)   levETIRAcetam 500 mg (01/28/24 1039)   norepinephrine (LEVOPHED) Adult infusion 13 mcg/min (01/28/24 0829)   sodium chloride (hypertonic) 20 mL/hr at 01/28/24 1016   vasopressin 0.03 Units/min (01/28/24 0829)    vitamin C  500 mg Oral BID   aspirin  81 mg Oral Daily   Chlorhexidine Gluconate Cloth  6 each Topical Daily   feeding supplement (GLUCERNA SHAKE)  237 mL Oral TID BM   Gerhardt's butt cream   Topical BID   hydrocortisone sod succinate (SOLU-CORTEF) inj  50 mg Intravenous Q6H   insulin aspart  3-9 Units Subcutaneous Q4H   insulin glargine-yfgn  26 Units Subcutaneous BID   lactulose  20 g Oral BID   midodrine  10 mg Oral TID WC   multivitamin with minerals  1 tablet Oral Daily   mupirocin ointment  1 Application Nasal BID   pantoprazole  40 mg Oral Daily   rifaximin  550 mg Oral BID   rosuvastatin  20 mg Oral Daily   sodium chloride flush  10-40 mL Intracatheter Q12H  thiamine  100 mg Oral Daily   zinc sulfate (50mg  elemental zinc)  220 mg Oral Daily   acetaminophen, docusate sodium, fentaNYL (SUBLIMAZE) injection, mouth rinse, polyethylene glycol, polyvinyl alcohol, sodium chloride flush  Assessment/ Plan:  58 y.o. female with past medical history of anemia, depression, diabetes mellitus type 2, liver cirrhosis, GERD, hypertension, obstructive sleep apnea who was admitted on 01/26/2024 for acute hyponatremia, ventricular tachycardia, septic shock, altered mental status.  1.  Severe hyponatremia.  Serum sodium was 115 upon arrival.  Urine sodium was less than 10.  Patient was initially started on 3% saline and stopped within a short period of time.    Sodium 121 today, patient has multiple IV medications infusing.  Will start patient on low-dose 3% hypertonic saline 20 mL/h and monitor closely.  Recommend attempting to wean IV  medications if possible.  2.  Acute kidney injury/hyperkalemia.  Acute kidney injury likely secondary to hypotension with development of ATN.  CT abdomen pelvis negative for obstruction.  Creatinine 1.19 with adequate urine output.  No acute indication for dialysis.  3.  Acute metabolic acidosis.  Serum bicarbonate 23.  Corrected.  Recommend stopping this infusion.   LOS: 2 Abigail Molina 2/27/202510:50 AM

## 2024-01-28 NOTE — Progress Notes (Signed)
 NAME:  Abigail Molina, MRN:  [id number], DOB:  01/04/1966, LOS: 2 ADMISSION DATE:  01/26/2024, CONSULTATION DATE: 01/26/2024 REFERRING MD: Dr. Roxan Hockey , CHIEF COMPLAINT: Unresponsiveness    Brief Pt Description / Synopsis:  58 y.o. female admitted with Acute Metabolic Encephalopathy and Severe Sepsis with Septic Shock in the setting of Klebsiella Oxytoca Bacteremia due to UTI and suspected pneumonia, along with NSTEMI, Acute Kidney Injury and Hyponatremia. Required intubation and mechanical ventilation, now extubated.  History of Present Illness:  This is a 58 yo female who presented to [facility] ER on 02/25 via EMS from home after being found unresponsive at her home by family.  It was reported her last known well time was 2100 on 02/24.  When EMS arrived on the scene pt found to be hypotensive bp 56/29 and febrile axillary temp 100.2 F.    ED Course  In the ER pt remained hypotensive and unresponsive requiring mechanical intubation.  She met sepsis protocol and received: 3L NS bolus/azithromycin/ceftriaxone.  She remained hypotensive requiring levophed gtt.  Significant lab results were: Na+ 115/K+ 6.2/chloride 86/CO2 17/glucose 126/BUN 67/creatinine 2.41/calcium 8.6/alk phos 204/albumin 1.9/AST 71/lactic acid 6.2/wbc 40.9/hgb 7.2/PT 18.8/INR 1.6.  UA results: small leukocytes/glucose >= 500/rare bacteria.  CT Head/Cervical Spine negative for acute abnormality.  CT Abd/Pelvis negative for acute abdominopelvic process, but revealed cirrhosis with portal hypertension and splenomegaly; trace left pleural effusion; and early pneumonia vs. atelectasis.  Due to severe hyponatremia pt received a 100 ml hypertonic saline bolus.  PCCM team contacted for ICU admission.    01/28/24- improved overnight, s/p liberation from Mechanical ventilator.  Off insulin gtt.  She is on PT/OT.  She remains on vasopressors.  Refining meds to PO.  Nourishment PO without aspiration events.   Pertinent  Medical History  Anemia   Anxiety  Arthritis  Endometrial Cancer  Chronic Pain  Cirrhosis of Liver  Depression  Type II Diabetes Mellitus  NASH  GERD Glaucoma  HLD HTN  Meningioma OSA  Overactive Bladder  Psoriasis   Micro Data:  02/25: MRSA PCR>>positive  02/25: Covid & Resp panel by RT-PCR>>negative  02/25: Blood x2>> Klebsiella oxytoca 02/25: Urine>> 02/25: Tracheal aspirate>>  Antimicrobials:   Anti-infectives (From admission, onward)    Start     Dose/Rate Route Frequency Ordered Stop   01/27/24 2200  rifaximin (XIFAXAN) tablet 550 mg        550 mg Oral 2 times daily 01/27/24 1458     01/27/24 1000  cefTRIAXone (ROCEPHIN) 2 g in sodium chloride 0.9 % 100 mL IVPB        2 g 200 mL/hr over 30 Minutes Intravenous Every 24 hours 01/26/24 1316     01/27/24 1000  doxycycline (VIBRAMYCIN) 100 mg in sodium chloride 0.9 % 250 mL IVPB  Status:  Discontinued        100 mg 125 mL/hr over 120 Minutes Intravenous Every 12 hours 01/26/24 1341 01/27/24 0910   01/27/24 0845  azithromycin (ZITHROMAX) 500 mg in sodium chloride 0.9 % 250 mL IVPB  Status:  Discontinued        500 mg 250 mL/hr over 60 Minutes Intravenous Every 24 hours 01/26/24 1316 01/26/24 1341   01/26/24 1615  vancomycin (VANCOREADY) IVPB 1750 mg/350 mL        1,750 mg 175 mL/hr over 120 Minutes Intravenous  Once 01/26/24 1523 01/26/24 1928   01/26/24 1610  vancomycin variable dose per unstable renal function (pharmacist dosing)  Status:  Discontinued  Does not apply See admin instructions 01/26/24 1610 01/27/24 0910   01/26/24 1430  rifaximin (XIFAXAN) tablet 550 mg  Status:  Discontinued        550 mg Per Tube 2 times daily 01/26/24 1339 01/27/24 1458   01/26/24 0845  cefTRIAXone (ROCEPHIN) 2 g in sodium chloride 0.9 % 100 mL IVPB        2 g 200 mL/hr over 30 Minutes Intravenous Once 01/26/24 0830 01/26/24 0906   01/26/24 0845  azithromycin (ZITHROMAX) 500 mg in sodium chloride 0.9 % 250 mL IVPB        500 mg 250 mL/hr over 60  Minutes Intravenous  Once 01/26/24 0830 01/26/24 1012       Significant Hospital Events: Including procedures, antibiotic start and stop dates in addition to other pertinent events   02/25: Admitted with acute toxic metabolic encephalopathy, acute kidney injury, hyperkalemia, anion gap metabolic acidosis, hypotension, intermittent wide complex tachycardia, and intermittent bradycardia requiring mechanical intubation and levophed gtt.  Nephrology consulted. 02/26: Extubated.  Remains on Levophed and Vasopressin, check CVP to assess need for further fluid requirements. Start stress dose steroids.  Blood cultures + Klebsiella Oxytoca, deescalate ABX to Ceftriaxone, consult ID. AKI and metabolic acidosis slowly improving.  Consult Cardiology due to concern for NSTEMI (HS Troponin peaked at 4087), restarting Heparin gtt.   Objective   Blood pressure (!) 106/55, pulse 86, temperature 98.3 F (36.8 C), temperature source Oral, resp. rate 14, height 5\' 4"  (1.626 m), weight 90.3 kg, last menstrual period 10/15/2016, SpO2 94%. CVP:  [7 mmHg-21 mmHg] 16 mmHg  Vent Mode: PSV FiO2 (%):  [40 %] 40 % PEEP:  [5 cmH20] 5 cmH20 Pressure Support:  [5 cmH20] 5 cmH20   Intake/Output Summary (Last 24 hours) at 01/28/2024 1610 Last data filed at 01/28/2024 0700 Gross per 24 hour  Intake 4208.33 ml  Output 2280 ml  Net 1928.33 ml   Filed Weights   01/26/24 1445 01/27/24 0400 01/28/24 0400  Weight: 88.4 kg 89.9 kg 90.3 kg    Examination: General: Acutely on chronically-ill appearing female, just extubated, in NAD  HENT: Atraumatic, normocephalic, neck Supple, no JVD  Lungs: Clear diminished throughout, even, non labored  Cardiovascular: Regular rate and rhythm, s1s2, no m/r/g, 2+ radial/1+ distal pulses, no edema   Abdomen: +BS x4, obese mildly distended Extremities: Normal bulk and tone, no deformities Skin: Stage II sacral spine pressure ulcer with erythema with abrasions present on buttocks present on  admission   Neuro: Awake and alert, oriented x3, moves all extremities to commands, no focal deficits noted, speech clear, PERRL GU: Indwelling foley catheter draining yellow urine   Assessment & Plan:   #Acute toxic metabolic encephalopathy  #Mechanical intubation pain/discomfort ~ NOW EXTUBATED Hx: Meningioma and seizures  -CT Head negative for acute intracranial abnormality, age indeterminate left cerebellar infarct -EEG suggestive of severe diffuse encephalopathy, no seizures -Treatment of metabolic derangements as outlined above -Provide supportive care -Promote normal sleep/wake cycle and family presence -Avoid sedating medications as able -Continue outpatient Keppra  #Septic shock -present on admission- due to Klebsiella bacteremia and UTI #NSTEMI #Intermittent wide complex tachycardia  #Intermittent vtach #Intermittent bradycardia  Hx: HTN and HLD  Echocardiogram 01/26/24: LVEF >55%, normal diastolic parameters, RV systolic function is normal, RV size normal -Continuous cardiac monitoring -Maintain MAP >65 -IV fluids ~ check CVP to assess need for further boluses ~ CVP is 8, hold off on further bolus -Vasopressors as needed to maintain MAP goal  -Start Stress  Dose Steroids -Trend lactic acid until normalized -HS Troponin peaked at 4087 -Consult Cardiology, appreciate input ~ Recommendations: initially Heparin held yesterday due to low Hgb, but has been stable today, will resume Heparin and monitor and treat medically -Continue Aspirin 81 mg daily, start Crestor 20 mg daily -Hold outpatient antihypertensives and diuretic therapy   #Severe Sepsis- PRESENT ON ADMISSION - DUE TO BACTEREMIA WITH KLEBSIELLA #Klebsiella Oxytoca BACTEREMIA #Suspected Pneumonia and UTI -Monitor fever curve -Trend WBC's & Procalcitonin -Follow cultures as above -Continue empiric Ceftriaxone pending cultures & sensitivities -Will consult ID to help guide further antimicrobials, appreciate  input  #Acute respiratory failure  #Possible pneumonia  Hx: OSA  EXTUBATED 2/26 -Supplemental O2 as needed to maintain O2 sats >92% -BiPAP if needed -Follow intermittent Chest X-ray & ABG as needed -Bronchodilators & Pulmicort nebs -IV Steroids -ABX as above -Pulmonary toilet as able  #Severe hyponatremia ~ IMPROVING #Acute kidney injury secondary to ATN  ~ IMPROVING #Non anion gap metabolic acidosis ~ IMPROVING #Hyperkalemia ~ RESOLVED #Lactic acidosis  -Monitor I&O's / urinary output -Follow BMP -Ensure adequate renal perfusion -Avoid nephrotoxic agents as able -Replace electrolytes as indicated ~ Pharmacy following for assistance with electrolyte replacement -Continue Bicarb gtt -Nephrology following, appreciate input ~ no indication for HD currently   #NASH cirrhosis  #Portal hypertension  Hx: Esophageal varices  - Trend hepatic function panel and coags  - Avoid hepatotoxic medications as able  - Continue outpatient lactulose and rifaximin   #Anemia with no signs of obvious bleeding  - Trend CBC  - Monitor for s/sx of bleeding  - Transfuse for hgb <7  #Type II diabetes mellitus  - CBG's q4hrs  - SSI  - Target CBG's 140 to 180's - Follow hypo/hyperglycemic protocol   #Stage II pressure ulcer present on admission  - Wound care consulted appreciate input  - Turn q2hrs     Best Practice (right click and "Reselect all SmartList Selections" daily)   Diet/type: NPO until passes bedside swallow evaluation DVT prophylaxis start Heparin gtt for ACS Pressure ulcer(s): present on admission  GI prophylaxis: PPI Lines: Central line and is still needed Foley:  Yes, and it is still needed Code Status:  full code Last date of multidisciplinary goals of care discussion [2/26]   Labs   CBC: Recent Labs  Lab 01/26/24 0834 01/26/24 2038 01/27/24 0414 01/28/24 0454  WBC 40.9* 58.6* 50.9* 25.6*  NEUTROABS 36.1* 53.3* 43.5*  --   HGB 7.2* 8.0* 8.2* 6.6*  HCT  21.9* 25.2* 24.9* 20.5*  MCV 80.8 82.6 81.4 79.5*  PLT 185 385 416* 243    Basic Metabolic Panel: Recent Labs  Lab 01/26/24 1153 01/26/24 1213 01/26/24 1554 01/26/24 2038 01/27/24 0010 01/27/24 0414 01/28/24 0454  NA  --  119*  --  121* 122* 125* 121*  K  --  6.6* 6.2* 5.9* 5.4* 4.7 4.2  CL  --  89*  --  90* 90* 93* 87*  CO2  --  17*  --  19* 19* 21* 23  GLUCOSE  --  297*  --  347* 327* 151* 151*  BUN  --  61*  --  57* 57* 54* 49*  CREATININE  --  2.05*  --  1.78* 1.69* 1.47* 1.19*  CALCIUM  --  8.3*  --  8.9 8.9 8.9 8.5*  MG 2.1  --   --   --   --  2.3 2.3  PHOS 5.1*  --   --   --   --  3.3 2.3*   GFR: Estimated Creatinine Clearance: 55.4 mL/min (A) (by C-G formula based on SCr of 1.19 mg/dL (H)). Recent Labs  Lab 01/26/24 0834 01/26/24 0950 01/26/24 1213 01/26/24 1752 01/26/24 2038 01/26/24 2039 01/27/24 0414 01/27/24 1130 01/27/24 1415 01/28/24 0454  PROCALCITON  --   --  17.70  --   --   --  11.39  --   --  5.14  WBC 40.9*  --   --   --  58.6*  --  50.9*  --   --  25.6*  LATICACIDVEN 6.2*   < >  --    < >  --    < > 3.5* 2.4* 2.4* 2.5*   < > = values in this interval not displayed.    Liver Function Tests: Recent Labs  Lab 01/26/24 0834 01/28/24 0454  AST 71*  --   ALT 26  --   ALKPHOS 204*  --   BILITOT 5.1*  --   PROT 6.4*  --   ALBUMIN 1.9* 2.5*   No results for input(s): "LIPASE", "AMYLASE" in the last 168 hours. Recent Labs  Lab 01/26/24 0834 01/26/24 1213  AMMONIA 52* 56*    ABG    Component Value Date/Time   PHART 7.38 01/27/2024 0408   PCO2ART 40 01/27/2024 0408   PO2ART 113 (H) 01/27/2024 0408   HCO3 23.7 01/27/2024 0408   ACIDBASEDEF 1.3 01/27/2024 0408   O2SAT 99.4 01/27/2024 0408     Coagulation Profile: Recent Labs  Lab 01/26/24 0834  INR 1.6*    Cardiac Enzymes: No results for input(s): "CKTOTAL", "CKMB", "CKMBINDEX", "TROPONINI" in the last 168 hours.  HbA1C: Hemoglobin A1C  Date/Time Value Ref Range Status   11/04/2022 12:00 AM 7.8  Final    Comment:    care everywhere  12/06/2015 12:00 AM 5.6  Final   Hgb A1c MFr Bld  Date/Time Value Ref Range Status  01/26/2024 02:15 PM 8.5 (H) 4.8 - 5.6 % Final    Comment:    (NOTE)         Prediabetes: 5.7 - 6.4         Diabetes: >6.4         Glycemic control for adults with diabetes: <7.0   11/17/2018 09:42 AM 9.0 (H) 4.8 - 5.6 % Final    Comment:             Prediabetes: 5.7 - 6.4          Diabetes: >6.4          Glycemic control for adults with diabetes: <7.0     CBG: Recent Labs  Lab 01/28/24 0143 01/28/24 0246 01/28/24 0346 01/28/24 0459 01/28/24 0600  GLUCAP 178* 171* 149* 149* 140*    Review of Systems:   Positives in BOLD: Currently denies all complaints Gen: Denies fever, chills, weight change, fatigue, night sweats HEENT: Denies blurred vision, double vision, hearing loss, tinnitus, sinus congestion, rhinorrhea, sore throat, neck stiffness, dysphagia PULM: Denies shortness of breath, cough, sputum production, hemoptysis, wheezing CV: Denies chest pain, edema, orthopnea, paroxysmal nocturnal dyspnea, palpitations GI: Denies abdominal pain, nausea, vomiting, diarrhea, hematochezia, melena, constipation, change in bowel habits GU: Denies dysuria, hematuria, polyuria, oliguria, urethral discharge Endocrine: Denies hot or cold intolerance, polyuria, polyphagia or appetite change Derm: Denies rash, dry skin, scaling or peeling skin change Heme: Denies easy bruising, bleeding, bleeding gums Neuro: Denies headache, numbness, weakness, slurred speech, loss of memory or consciousness   Past Medical History:  She,  has a past medical history of Anemia, Anxiety, Arthritis, Cancer (HCC), Chronic pain, Cirrhosis of liver (HCC), Depression, Diabetes mellitus without complication (HCC), Fatty liver disease, nonalcoholic (2021), GERD (gastroesophageal reflux disease), Glaucoma, Hyperlipidemia, Hypertension, Meningioma (HCC), OSA  (obstructive sleep apnea) (05/08/2023), Overactive bladder, Pharmacologic therapy (11/02/2023), and Psoriasis (2015).   Surgical History:   Past Surgical History:  Procedure Laterality Date   ABDOMINAL HYSTERECTOMY  2007   CHOLECYSTECTOMY  2000     Social History:   reports that she has never smoked. She has never used smokeless tobacco. She reports that she does not currently use alcohol. She reports that she does not use drugs.   Family History:  Her family history includes Alcohol abuse in her cousin and maternal grandmother; Bipolar disorder in her cousin; Depression in her mother; Diabetes in her mother; Heart disease in her maternal grandfather; Hypertension in her mother; Obesity in her mother; Stroke in her paternal grandfather; Vision loss in her mother.   Allergies No Known Allergies   Home Medications  Prior to Admission medications   Medication Sig Start Date End Date Taking? Authorizing Provider  aspirin 81 MG tablet Take 81 mg by mouth daily.   Yes [provider]  Baclofen 5 MG TABS Take 2 tablets by mouth at bedtime.   Yes [provider]  buPROPion (WELLBUTRIN XL) 150 MG 24 hr tablet Take 1 tablet (150 mg total) by mouth daily. 11/16/23 03/15/24 Yes Hisada, Barbee Cough, MD  canagliflozin [facility]) 300 MG TABS tablet Take by mouth. 02/17/22  Yes [provider]  carvedilol (COREG) 6.25 MG tablet Take 6.25 mg by mouth 2 (two) times daily. 06/19/22  Yes [provider]  citalopram (CELEXA) 20 MG tablet Take 20 mg by mouth daily.   Yes [provider]  Continuous Glucose Sensor (FREESTYLE LIBRE 3 SENSOR) MISC by Does not apply route daily.   Yes [provider]  COSENTYX SENSOREADY, 300 MG, 150 MG/ML SOAJ Inject into the skin.   Yes [provider]  Ferrous Sulfate (SLOW FE) 142 (45 Fe) MG TBCR Take by mouth. 09/12/21  Yes [provider]  furosemide (LASIX) 40 MG tablet Take 40 mg by mouth 2 (two) times daily.  09/25/23  Yes [provider]  gabapentin (NEURONTIN) 300 MG capsule Take by mouth once. 06/02/23 06/01/24 Yes [provider]  insulin lispro (HUMALOG) 100 UNIT/ML KwikPen Inject into the skin.   Yes [provider]  lactulose (CHRONULAC) 10 GM/15ML solution SMARTSIG:Milliliter(s) By Mouth 06/19/22  Yes [provider]  LANTUS SOLOSTAR 100 UNIT/ML Solostar Pen Inject 66 Units into the skin daily. 11/25/22 01/26/24 Yes [provider]  levETIRAcetam (KEPPRA) 500 MG tablet Take 750 mg by mouth 2 (two) times daily. 01/04/24 07/02/24 Yes [provider]  MAGNESIUM-OXIDE 400 (240 Mg) MG tablet Take 2 tablets by mouth 2 (two) times daily. 10/14/23  Yes [provider]  metFORMIN (GLUCOPHAGE-XR) 500 MG 24 hr tablet Take 1,000 mg by mouth 2 (two) times daily.   Yes [provider]  mirtazapine (REMERON) 15 MG tablet Take 1 tablet (15 mg total) by mouth at bedtime. 11/28/23 01/26/24 Yes Hisada, Barbee Cough, MD  ondansetron (ZOFRAN-ODT) 4 MG disintegrating tablet Dissolve 1 tablet (4 mg total) in the mouth every twelve (12) hours as needed for nausea. 10/05/23 10/04/24 Yes [provider]  pantoprazole (PROTONIX) 40 MG tablet TAKE ONE TABLET BY MOUTH EVERY DAY 10/24/19  Yes McGowan, Carollee Herter A, PA-C  promethazine (PHENERGAN) 12.5 MG tablet  Take by mouth. 05/23/22  Yes [provider]  scopolamine (TRANSDERM-SCOP) 1 MG/3DAYS Place 1 patch onto the skin every 3 (three) days. 12/07/23  Yes [provider]  spironolactone (ALDACTONE) 100 MG tablet Take 100 mg by mouth 2 (two) times daily.   Yes [provider]  XIFAXAN 550 MG TABS tablet Take 550 mg by mouth 2 (two) times daily.   Yes [provider]  zolpidem (AMBIEN) 5 MG tablet Take 1 tablet (5 mg total) by mouth at bedtime as needed for sleep. 11/01/23 01/26/24 Yes Neysa Hotter, MD  Accu-Chek Softclix Lancets lancets SMARTSIG:Topical    [provider]  BD  PEN NEEDLE NANO 2ND GEN 32G X 4 MM MISC USE 1 NIGHTLY 09/28/23   [provider]  chlorhexidine (PERIDEX) 0.12 % solution SMARTSIG:0.5 Capful(s) By Mouth Morning-Evening Patient not taking: Reported on 01/26/2024 10/24/23   [provider]  clotrimazole (MYCELEX) 10 MG troche Take 10 mg by mouth 5 (five) times daily. Patient not taking: Reported on 01/26/2024 08/20/23   [provider]  Continuous Glucose Receiver (DEXCOM G7 RECEIVER) DEVI USE AS INSTRUCTED WITH G7 SENSORS Patient not taking: Reported on 01/26/2024 09/04/23   [provider]  Continuous Glucose Sensor (DEXCOM G7 SENSOR) MISC USE TO MONITOR BLOOD GLUCOSE LEVELS CONTINUOUSLY. CHANGE SENSOR EVERY 10 DAYS. Patient not taking: Reported on 01/26/2024    [provider]  Dulaglutide (TRULICITY) 1.5 MG/0.5ML SOPN Inject 1.5 mg into the skin once a week. Thursdays Patient not taking: Reported on 01/26/2024    [provider]  insulin glargine-yfgn (SEMGLEE, YFGN,) 100 UNIT/ML Pen INJECT 50 UNITS UNDER THE SKIN NIGHTLY AS DIRECTED BY YOUR DOCTOR AROUND SAME TIME EVERY DAY. TOTAL DAILY DOSE NOT TO EXCEED 50 UNITS Patient not taking: Reported on 01/26/2024 10/06/23   [provider]  Insulin Pen Needle 32G X 6 MM MISC 1 Syringe by Does not apply route. Use with Victoza.    [provider]  naltrexone (DEPADE) 50 MG tablet Take 50 mg by mouth daily.    [provider]  Secukinumab (COSENTYX SENSOREADY PEN) 150 MG/ML SOAJ Inject the contents of 2 pens (300 mg total) under the skin once a week at weeks 0, 1, 2, 3, and 4. THEN inject the contents of 2 pens (300 mg total) every 4 weeks thereafter. Patient not taking: Reported on 01/26/2024 09/30/23   [provider]  Sharps Container (BD SHARPS COLLECTOR) MISC Use as directed to dispose of Cosentyx pens. 10/05/23   [provider]  simvastatin (ZOCOR) 20 MG tablet TAKE ONE TABLET BY MOUTH EVERY EVENING 10/24/19    McGowan, Wellington Hampshire, PA-C     Critical care provider statement:   Total critical care time: 33 minutes   Performed by: Karna Christmas MD   Critical care time was exclusive of separately billable procedures and treating other patients.   Critical care was necessary to treat or prevent imminent or life-threatening deterioration.   Critical care was time spent personally by me on the following activities: development of treatment plan with patient and/or surrogate as well as nursing, discussions with consultants, evaluation of patient's response to treatment, examination of patient, obtaining history from patient or surrogate, ordering and performing treatments and interventions, ordering and review of laboratory studies, ordering and review of radiographic studies, pulse oximetry and re-evaluation of patient's condition.    Vida Rigger, M.D.  Pulmonary & Critical Care Medicine

## 2024-01-28 NOTE — Progress Notes (Signed)
 [facility] CLINIC CARDIOLOGY PROGRESS NOTE       Patient ID: Abigail Molina MRN: [id number] DOB/AGE: [birth year] 58 y.o.  Admit date: 01/26/2024 Referring Physician Harlon Ditty, NP Primary Physician Care, [place] [facility]  Primary Cardiologist None Reason for Consultation NSTEMI  HPI: Abigail Molina is a 58 y.o. female  with a past medical history of hypertension, hyperlipidemia, type 2 diabetes, anemia, endometrial cancer, OSA, GERD, NASH who presented to the ED on 01/26/2024 for unresponsiveness, found down by family and EMS brought her to the ED.  Troponins elevated.  Cardiology was consulted for further evaluation.   Interval history: -Patient seen and examined this morning, working with PT. -Sitting up in bed with the assistance of physical therapy, tolerating this well without any complaints. -She denies any dizziness symptoms.  Still remains on pressors. -Denies any chest pain or shortness of breath symptoms.  Remains on 2 L supplemental O2. -Found to be anemic on a.m. labs, heparin has been held and she is receiving blood transfusion this morning.  Review of systems complete and found to be negative unless listed above    Past Medical History:  Diagnosis Date   Anemia    Anxiety    Arthritis    Cancer (HCC)    Endometrial cancer   Chronic pain    Cirrhosis of liver (HCC)    Depression    Diabetes mellitus without complication (HCC)    Fatty liver disease, nonalcoholic 2021   GERD (gastroesophageal reflux disease)    Glaucoma    Hyperlipidemia    Hypertension    Meningioma (HCC)    OSA (obstructive sleep apnea) 05/08/2023   Overactive bladder    Pharmacologic therapy 11/02/2023   Psoriasis 2015    Past Surgical History:  Procedure Laterality Date   ABDOMINAL HYSTERECTOMY  2007   CHOLECYSTECTOMY  2000    Medications Prior to Admission  Medication Sig Dispense Refill Last Dose/Taking   aspirin 81 MG tablet Take 81 mg by mouth daily.   01/25/2024   Baclofen 5 MG TABS  Take 2 tablets by mouth at bedtime.   01/25/2024   buPROPion (WELLBUTRIN XL) 150 MG 24 hr tablet Take 1 tablet (150 mg total) by mouth daily. 30 tablet 3 01/25/2024   canagliflozin (INVOKANA) 300 MG TABS tablet Take by mouth.   01/25/2024   carvedilol (COREG) 6.25 MG tablet Take 6.25 mg by mouth 2 (two) times daily.   01/25/2024   citalopram (CELEXA) 20 MG tablet Take 20 mg by mouth daily.   01/25/2024   Continuous Glucose Sensor (FREESTYLE LIBRE 3 SENSOR) MISC by Does not apply route daily.   01/26/2024   COSENTYX SENSOREADY, 300 MG, 150 MG/ML SOAJ Inject into the skin.   Taking   Ferrous Sulfate (SLOW FE) 142 (45 Fe) MG TBCR Take by mouth.   01/26/2024   furosemide (LASIX) 40 MG tablet Take 40 mg by mouth 2 (two) times daily.   01/25/2024   gabapentin (NEURONTIN) 300 MG capsule Take by mouth once.   01/25/2024   insulin lispro (HUMALOG) 100 UNIT/ML KwikPen Inject into the skin.   01/25/2024   lactulose (CHRONULAC) 10 GM/15ML solution SMARTSIG:Milliliter(s) By Mouth   Past Week   LANTUS SOLOSTAR 100 UNIT/ML Solostar Pen Inject 66 Units into the skin daily.   01/25/2024   levETIRAcetam (KEPPRA) 500 MG tablet Take 750 mg by mouth 2 (two) times daily.   Taking   MAGNESIUM-OXIDE 400 (240 Mg) MG tablet Take 2 tablets by mouth 2 (two)  times daily.   01/25/2024   metFORMIN (GLUCOPHAGE-XR) 500 MG 24 hr tablet Take 1,000 mg by mouth 2 (two) times daily.   01/25/2024   mirtazapine (REMERON) 15 MG tablet Take 1 tablet (15 mg total) by mouth at bedtime. 30 tablet 0 01/25/2024   ondansetron (ZOFRAN-ODT) 4 MG disintegrating tablet Dissolve 1 tablet (4 mg total) in the mouth every twelve (12) hours as needed for nausea.   Past Week   pantoprazole (PROTONIX) 40 MG tablet TAKE ONE TABLET BY MOUTH EVERY DAY 90 tablet 0 01/25/2024   promethazine (PHENERGAN) 12.5 MG tablet Take by mouth.   Taking   scopolamine (TRANSDERM-SCOP) 1 MG/3DAYS Place 1 patch onto the skin every 3 (three) days.   Taking   spironolactone (ALDACTONE) 100  MG tablet Take 100 mg by mouth 2 (two) times daily.   01/25/2024   XIFAXAN 550 MG TABS tablet Take 550 mg by mouth 2 (two) times daily.   01/25/2024   zolpidem (AMBIEN) 5 MG tablet Take 1 tablet (5 mg total) by mouth at bedtime as needed for sleep. 30 tablet 1 Past Week   Accu-Chek Softclix Lancets lancets SMARTSIG:Topical      BD PEN NEEDLE NANO 2ND GEN 32G X 4 MM MISC USE 1 NIGHTLY      chlorhexidine (PERIDEX) 0.12 % solution SMARTSIG:0.5 Capful(s) By Mouth Morning-Evening (Patient not taking: Reported on 01/26/2024)   Not Taking   clotrimazole (MYCELEX) 10 MG troche Take 10 mg by mouth 5 (five) times daily. (Patient not taking: Reported on 01/26/2024)   Not Taking   Continuous Glucose Receiver (DEXCOM G7 RECEIVER) DEVI USE AS INSTRUCTED WITH G7 SENSORS (Patient not taking: Reported on 01/26/2024)   Not Taking   Continuous Glucose Sensor (DEXCOM G7 SENSOR) MISC USE TO MONITOR BLOOD GLUCOSE LEVELS CONTINUOUSLY. CHANGE SENSOR EVERY 10 DAYS. (Patient not taking: Reported on 01/26/2024)   Not Taking   Dulaglutide (TRULICITY) 1.5 MG/0.5ML SOPN Inject 1.5 mg into the skin once a week. Thursdays (Patient not taking: Reported on 01/26/2024)   Not Taking   insulin glargine-yfgn (SEMGLEE, YFGN,) 100 UNIT/ML Pen INJECT 50 UNITS UNDER THE SKIN NIGHTLY AS DIRECTED BY YOUR DOCTOR AROUND SAME TIME EVERY DAY. TOTAL DAILY DOSE NOT TO EXCEED 50 UNITS (Patient not taking: Reported on 01/26/2024)   Not Taking   Insulin Pen Needle 32G X 6 MM MISC 1 Syringe by Does not apply route. Use with Victoza.      naltrexone (DEPADE) 50 MG tablet Take 50 mg by mouth daily.      Secukinumab (COSENTYX SENSOREADY PEN) 150 MG/ML SOAJ Inject the contents of 2 pens (300 mg total) under the skin once a week at weeks 0, 1, 2, 3, and 4. THEN inject the contents of 2 pens (300 mg total) every 4 weeks thereafter. (Patient not taking: Reported on 01/26/2024)   Not Taking   Sharps Container (BD SHARPS COLLECTOR) MISC Use as directed to dispose of  Cosentyx pens.      Social History   Socioeconomic History   Marital status: Widowed    Spouse name: Not on file   Number of children: Not on file   Years of education: Not on file   Highest education level: Associate degree: academic program  Occupational History   Not on file  Tobacco Use   Smoking status: Never   Smokeless tobacco: Never  Vaping Use   Vaping status: Never Used  Substance and Sexual Activity   Alcohol use: Not Currently  Comment:  Rarely. One or two drinks a year    Drug use: No   Sexual activity: Not Currently    Birth control/protection: Abstinence  Other Topics Concern   Not on file  Social History Narrative   PT is not working right now. Pt worries about financial needs since her husband died in 05/20/24 and she has no income. She applied for Widow's benefits in July but has not heard any information and the application is still pending. If possible please inquire about application status.     Social Drivers of Corporate investment banker Strain: Low Risk  (12/16/2023)   Received [facility]   Overall Financial Resource Strain (CARDIA)    Difficulty of Paying Living Expenses: Not very hard  Food Insecurity: Patient Unable To Answer (01/26/2024)   Hunger Vital Sign    Worried About Running Out of Food in the Last Year: Patient unable to answer    Ran Out of Food in the Last Year: Patient unable to answer  Transportation Needs: Patient Unable To Answer (01/26/2024)   PRAPARE - Transportation    Lack of Transportation (Medical): Patient unable to answer    Lack of Transportation (Non-Medical): Patient unable to answer  Physical Activity: Insufficiently Active (01/09/2021)   Received from [facility], [facility]   Exercise Vital Sign    Days of Exercise per Week: 1 day    Minutes of Exercise per Session: 40 min  Stress: Stress Concern Present (09/17/2022)   Received from [facility], [facility] - Occupational Stress Questionnaire    Feeling of Stress : Very much  Social Connections: Moderately Isolated (09/02/2018)   Social Connection and Isolation Panel [NHANES]    Frequency of Communication with Friends and Family: Once a week    Frequency of Social Gatherings with Friends and Family: Twice a week    Attends Religious Services: Never    Database administrator or Organizations: No    Attends Banker Meetings: Never    Marital Status: Widowed  Intimate Partner Violence: Patient Unable To Answer (01/26/2024)   Humiliation, Afraid, Rape, and Kick questionnaire    Fear of Current or Ex-Partner: Patient unable to answer    Emotionally Abused: Patient unable to answer    Physically Abused: Patient unable to answer    Sexually Abused: Patient unable to answer    Family History  Problem Relation Age of Onset   Diabetes Mother    Hypertension Mother    Depression Mother    Obesity Mother    Vision loss Mother    Alcohol abuse Cousin    Bipolar disorder Cousin    Heart disease Maternal Grandfather    Alcohol abuse Maternal Grandmother    Stroke Paternal Grandfather      Vitals:   01/28/24 0730 01/28/24 0745 01/28/24 0800 01/28/24 0830  BP: (!) 101/57 (!) 98/58 (!) 106/55 112/63  Pulse: 79 72 86 100  Resp: 16 12 14 19   Temp:    97.6 F (36.4 C)  TempSrc:    Axillary  SpO2: 100% 93% 94%   Weight:      Height:        PHYSICAL EXAM General: Ill appearing female, well nourished, in no acute distress. HEENT: Normocephalic and atraumatic. Neck: No JVD.  Lungs: Normal respiratory effort on room air. Clear bilaterally to auscultation. No wheezes, crackles, rhonchi.  Heart: HRRR. Normal S1  and S2 without gallops or murmurs.  Abdomen: Non-distended appearing.  Msk: Normal strength and tone for age. Extremities: Warm and well perfused. No clubbing, cyanosis. No edema.  Neuro: Alert and oriented X 3. Psych: Answers questions appropriately.   Labs: Basic  Metabolic Panel: Recent Labs    01/27/24 0414 01/28/24 0454 01/28/24 1008  NA 125* 121* 122*  K 4.7 4.2  --   CL 93* 87*  --   CO2 21* 23  --   GLUCOSE 151* 151*  --   BUN 54* 49*  --   CREATININE 1.47* 1.19*  --   CALCIUM 8.9 8.5*  --   MG 2.3 2.3  --   PHOS 3.3 2.3*  --    Liver Function Tests: Recent Labs    01/26/24 0834 01/28/24 0454  AST 71*  --   ALT 26  --   ALKPHOS 204*  --   BILITOT 5.1*  --   PROT 6.4*  --   ALBUMIN 1.9* 2.5*   No results for input(s): "LIPASE", "AMYLASE" in the last 72 hours. CBC: Recent Labs    01/26/24 2038 01/27/24 0414 01/28/24 0454  WBC 58.6* 50.9* 25.6*  NEUTROABS 53.3* 43.5*  --   HGB 8.0* 8.2* 6.6*  HCT 25.2* 24.9* 20.5*  MCV 82.6 81.4 79.5*  PLT 385 416* 243   Cardiac Enzymes: Recent Labs    01/27/24 0010 01/27/24 0414 01/27/24 0802  TROPONINIHS 2,680* 4,087* 3,717*   BNP: Recent Labs    01/26/24 1154  BNP 398.2*   D-Dimer: No results for input(s): "DDIMER" in the last 72 hours. Hemoglobin A1C: Recent Labs    01/26/24 1415  HGBA1C 8.5*   Fasting Lipid Panel: No results for input(s): "CHOL", "HDL", "LDLCALC", "TRIG", "CHOLHDL", "LDLDIRECT" in the last 72 hours. Thyroid Function Tests: No results for input(s): "TSH", "T4TOTAL", "T3FREE", "THYROIDAB" in the last 72 hours.  Invalid input(s): "FREET3" Anemia Panel: No results for input(s): "VITAMINB12", "FOLATE", "FERRITIN", "TIBC", "IRON", "RETICCTPCT" in the last 72 hours.   Radiology: EEG adult Result Date: 01/26/2024 Charlsie Quest, MD     01/26/2024  4:46 PM Patient Name: ATIRA BORELLO MRN: [id number] Epilepsy Attending: Charlsie Quest Referring Physician/Provider: Ezequiel Essex, NP Date: 01/26/2024 Duration: 32.31 mins Patient history: 57yo female with ams. EEG to evaluate for seizure Level of alertness: comatose AEDs during EEG study: LEV Technical aspects: This EEG study was done with scalp electrodes positioned according to the 10-20 International  system of electrode placement. Electrical activity was reviewed with band pass filter of 1-70Hz , sensitivity of 7 uV/mm, display speed of 16mm/sec with a 60Hz  notched filter applied as appropriate. EEG data were recorded continuously and digitally stored.  Video monitoring was available and reviewed as appropriate. Description: EEG showed continuous generalized 3 to 5 Hz theta-delta slowing. Hyperventilation and photic stimulation were not performed.   ABNORMALITY - Continuous slow, generalized IMPRESSION: This study is suggestive of severe diffuse encephalopathy. No seizures or epileptiform discharges were seen throughout the recording. Charlsie Quest   ECHOCARDIOGRAM COMPLETE Result Date: 01/26/2024    ECHOCARDIOGRAM REPORT   Patient Name:   CHRISTMAS FARACI Date of Exam: 01/26/2024 Medical Rec #:  [id number]    Height:       64.0 in Accession #:    [id number]   Weight:       212.0 lb Date of Birth:  [birth year]    BSA:          2.005 m Patient Age:  57 years     BP:           85/65 mmHg Patient Gender: F            HR:           Not listed in chart bpm. Exam Location:  [facility] Procedure: 2D Echo, Cardiac Doppler and Color Doppler (Both Spectral and Color            Flow Doppler were utilized during procedure). Indications:     Abnormal ECG R94.31  History:         Patient has no prior history of Echocardiogram examinations.                  Risk Factors:Hypertension and Dyslipidemia. Anxiety.  Sonographer:     Cristela Blue Referring Phys:  [id number] Ezequiel Essex Diagnosing Phys: Yvonne Kendall MD  Sonographer Comments: Echo performed with patient supine and on artificial respirator and no parasternal window. Zoll pad over sternum. IMPRESSIONS  1. Left ventricular ejection fraction, by estimation, is >55%. The left ventricle has normal function. Left ventricular endocardial border not optimally defined to evaluate regional wall motion. Left ventricular diastolic parameters were normal.  2. Right ventricular systolic  function is normal. The right ventricular size is normal. Tricuspid regurgitation signal is inadequate for assessing PA pressure.  3. The mitral valve is normal in structure. No evidence of mitral valve regurgitation. No evidence of mitral stenosis.  4. The aortic valve was not well visualized. Aortic valve regurgitation is not visualized. No aortic stenosis is present. FINDINGS  Left Ventricle: Left ventricular ejection fraction, by estimation, is >55%. The left ventricle has normal function. Left ventricular endocardial border not optimally defined to evaluate regional wall motion. Strain imaging was not performed. The left ventricular internal cavity size was normal in size. There is borderline left ventricular hypertrophy. Left ventricular diastolic parameters were normal. Right Ventricle: The right ventricular size is normal. No increase in right ventricular wall thickness. Right ventricular systolic function is normal. Tricuspid regurgitation signal is inadequate for assessing PA pressure. Left Atrium: Left atrial size was normal in size. Right Atrium: Right atrial size was normal in size. Pericardium: There is no evidence of pericardial effusion. Mitral Valve: The mitral valve is normal in structure. No evidence of mitral valve regurgitation. No evidence of mitral valve stenosis. MV peak gradient, 13.0 mmHg. The mean mitral valve gradient is 4.0 mmHg. Tricuspid Valve: The tricuspid valve is normal in structure. Tricuspid valve regurgitation is trivial. Aortic Valve: The aortic valve was not well visualized. Aortic valve regurgitation is not visualized. No aortic stenosis is present. Aortic valve mean gradient measures 3.0 mmHg. Aortic valve peak gradient measures 5.3 mmHg. Aortic valve area, by VTI measures 3.79 cm. Pulmonic Valve: The pulmonic valve was not well visualized. Pulmonic valve regurgitation is not visualized. No evidence of pulmonic stenosis. Aorta: The aortic root is normal in size and  structure. Pulmonary Artery: The pulmonary artery is not well seen. Venous: IVC assessment for right atrial pressure unable to be performed due to mechanical ventilation. IAS/Shunts: No atrial level shunt detected by color flow Doppler. Additional Comments: 3D imaging was not performed.  LEFT VENTRICLE PLAX 2D LVIDd:         3.50 cm   Diastology LVIDs:         2.60 cm   LV e' medial:    12.90 cm/s LV PW:         1.10 cm   LV E/e'  medial:  6.0 LV IVS:        1.00 cm   LV e' lateral:   16.90 cm/s LVOT diam:     2.00 cm   LV E/e' lateral: 4.6 LV SV:         63 LV SV Index:   31 LVOT Area:     3.14 cm  RIGHT VENTRICLE RV Basal diam:  3.40 cm RV Mid diam:    3.50 cm RV S prime:     15.30 cm/s TAPSE (M-mode): 2.3 cm LEFT ATRIUM           Index        RIGHT ATRIUM           Index LA diam:      3.30 cm 1.65 cm/m   RA Area:     15.10 cm LA Vol (A2C): 19.1 ml 9.52 ml/m   RA Volume:   33.10 ml  16.51 ml/m LA Vol (A4C): 26.1 ml 13.01 ml/m  AORTIC VALVE AV Area (Vmax):    2.92 cm AV Area (Vmean):   3.13 cm AV Area (VTI):     3.79 cm AV Vmax:           115.00 cm/s AV Vmean:          85.900 cm/s AV VTI:            0.165 m AV Peak Grad:      5.3 mmHg AV Mean Grad:      3.0 mmHg LVOT Vmax:         107.00 cm/s LVOT Vmean:        85.600 cm/s LVOT VTI:          0.199 m LVOT/AV VTI ratio: 1.21  AORTA Ao Root diam: 3.10 cm MITRAL VALVE MV Area (PHT): 5.75 cm    SHUNTS MV Area VTI:   2.10 cm    Systemic VTI:  0.20 m MV Peak grad:  13.0 mmHg   Systemic Diam: 2.00 cm MV Mean grad:  4.0 mmHg MV Vmax:       1.80 m/s MV Vmean:      88.2 cm/s MV Decel Time: 132 msec MV E velocity: 77.60 cm/s MV A velocity: 93.80 cm/s MV E/A ratio:  0.83 Cristal Deer End MD Electronically signed by Yvonne Kendall MD Signature Date/Time: 01/26/2024/4:06:41 PM    Final    DG Chest Port 1 View Result Date: 01/26/2024 CLINICAL DATA:  Central line readjustment. EXAM: PORTABLE CHEST 1 VIEW COMPARISON:  Chest x-ray from same day at 1247 hours. FINDINGS:  The patient remains rotated to the left. Interval retraction of the left internal jugular central venous catheter with the tip now in the distal SVC. Unchanged endotracheal and enteric tubes. Unchanged cardiomegaly and left retrocardiac opacity. No pleural effusion or pneumothorax. No acute osseous abnormality. IMPRESSION: 1. Interval retraction of the left internal jugular central venous catheter with the tip now in the distal SVC. Electronically Signed   By: Obie Dredge M.D.   On: 01/26/2024 14:11   DG Chest Port 1 View Result Date: 01/26/2024 CLINICAL DATA:  Central line placement. EXAM: PORTABLE CHEST 1 VIEW COMPARISON:  Chest x-ray from same day at 0950 hours. FINDINGS: The patient is rotated to the left. New left internal jugular central venous catheter with tip in the proximal right atrium. Unchanged endotracheal and enteric tubes. Unchanged cardiomegaly. Unchanged retrocardiac left lower lobe density. Clear right lung. No large pleural effusion or pneumothorax. No acute osseous abnormality. IMPRESSION:  1. New left internal jugular central venous catheter with tip in the proximal right atrium. No pneumothorax. 2. Unchanged retrocardiac left lower lobe atelectasis or pneumonia. Electronically Signed   By: Obie Dredge M.D.   On: 01/26/2024 14:10   DG Abdomen 1 View Result Date: 01/26/2024 CLINICAL DATA:  post og tube placement EXAM: ABDOMEN - 1 VIEW COMPARISON:  Chest XR and CT AP, concurrent. FINDINGS: Support lines: Enteric decompression tube, with tube tip and side port within stomach. The imaged bowel gas pattern is normal. Cholecystectomy clips. No radio-opaque calculi or other significant radiographic abnormality. IMPRESSION: 1. NG tube, well-positioned with tip and side port within the stomach. 2. Nonobstructed bowel-gas pattern. Electronically Signed   By: Roanna Banning M.D.   On: 01/26/2024 11:44   DG Chest Portable 1 View Result Date: 01/26/2024 CLINICAL DATA:  post intubation EXAM:  PORTABLE CHEST 1 VIEW COMPARISON:  Chest XR, 01/26/2024.  CT AP, concurrent. FINDINGS: Support lines: Intubation with ETT within the distal thoracic trachea, 3 cm from carina. Enteric decompression tube with tip extending outside field. Cardiomegaly. Aortic arch atherosclerosis. Mediastinum is within normal limits given technique and patient rotation. The RIGHT lung is clear. Patchy retrocardiac opacity. No large pleural effusion or pneumothorax. No acute displaced fracture. IMPRESSION: 1. Intubation with ETT well-positioned at the distal thoracic trachea. Additional lines and tubes, as above. 2. Cardiomegaly and Aortic Atherosclerosis (ICD10-I70.0). 3. Retrocardiac opacity is likely to represent atelectasis, however early pneumonia can appear similar. Electronically Signed   By: Roanna Banning M.D.   On: 01/26/2024 11:41   CT ABDOMEN PELVIS WO CONTRAST Result Date: 01/26/2024 CLINICAL DATA:  Sepsis EXAM: CT ABDOMEN AND PELVIS WITHOUT CONTRAST TECHNIQUE: Multidetector CT imaging of the abdomen and pelvis was performed following the standard protocol without IV contrast. RADIATION DOSE REDUCTION: This exam was performed according to the departmental dose-optimization program which includes automated exposure control, adjustment of the mA and/or kV according to patient size and/or use of iterative reconstruction technique. COMPARISON:  CT AP, 11/12/2012 and 08/10/2008. Chest XR and KUB, concurrent. FINDINGS: Lower chest: Trace LEFT pleural effusion with adjacent dependent patchy pulmonary consolidation. Hepatobiliary: Gross nodular contour liver. No focal abnormality. Cholecystectomy. No biliary dilatation. Pancreas: No pancreatic ductal dilatation or surrounding inflammatory changes. Spleen: Splenomegaly, measuring up to 17.0 cm Adrenals/Urinary Tract: Adrenal glands are unremarkable. Kidneys are normal, without renal calculi, focal lesion, or hydronephrosis. Bladder is decompressed by Foley catheter. Stomach/Bowel:  Stomach is within normal limits. Enteric decompression tube, with tip within the gastric body. Appendix appears normal. No evidence of bowel wall thickening, distention, or inflammatory changes. Vascular/Lymphatic: Aortic atherosclerosis without aneurysmal dilatation. No enlarged abdominal or pelvic lymph nodes. Reproductive: Hysterectomy.  No adnexal mass. Other: No abdominal wall hernia or abnormality. Trace mesenteric stranding. Small volume perihepatic, perisplenic, LEFT lower quadrant and deep pelvic ascites. Musculoskeletal: Mild body wall edema. No acute or significant osseous findings. IMPRESSION: Suboptimal evaluation, secondary to a lack of intravenous contrast. 1. No acute abdominopelvic process. 2. Cirrhosis with portal hypertension, as evidenced by splenomegaly. Small volume of abdominopelvic ascites. 3. Trace LEFT pleural effusion, with adjacent pulmonary consolidation. This may represent atelectasis, though early pneumonia can appear similar. Electronically Signed   By: Roanna Banning M.D.   On: 01/26/2024 11:20   CT HEAD WO CONTRAST ( ) Result Date: 01/26/2024 CLINICAL DATA:  Mental status change, unknown cause; Neck trauma, focal neuro deficit or paresthesia (Age 28-64y) EXAM: CT HEAD WITHOUT CONTRAST CT CERVICAL SPINE WITHOUT CONTRAST TECHNIQUE: Multidetector CT imaging of the  head and cervical spine was performed following the standard protocol without intravenous contrast. Multiplanar CT image reconstructions of the cervical spine were also generated. RADIATION DOSE REDUCTION: This exam was performed according to the departmental dose-optimization program which includes automated exposure control, adjustment of the mA and/or kV according to patient size and/or use of iterative reconstruction technique. COMPARISON:  None Available. FINDINGS: CT HEAD FINDINGS Brain: No hemorrhage. No hydrocephalus. No extra-axial fluid collection. No mass effect. No mass lesion. There is an age indeterminate  left cerebellar infarct. Vascular: No hyperdense vessel or unexpected calcification. Skull: Soft tissue swelling along the left frontal scalp. No evidence of an underlying calvarial fracture. Sinuses/Orbits: No middle ear or mastoid effusion. Paranasal sinuses are clear. Orbits are unremarkable Other: None. CT CERVICAL SPINE FINDINGS Alignment: Normal. Skull base and vertebrae: No acute fracture. No primary bone lesion or focal pathologic process. Soft tissues and spinal canal: No prevertebral fluid or swelling. No visible canal hematoma. Disc levels:  CT evidence of high-grade spinal canal stenosis Upper chest: Negative. Other: Partially imaged endotracheal and enteric tubes in place. IMPRESSION: 1. No CT evidence of intracranial injury. 2. 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 If there is clinical concern for an acute infarct, consider further evaluation with MRI. 3. Soft tissue swelling along the left frontal scalp. No evidence of an underlying calvarial fracture. 4. No acute fracture or traumatic subluxation of the cervical spine. Electronically Signed   By: Lorenza Cambridge M.D.   On: 01/26/2024 10:46   CT Cervical Spine Wo Contrast Result Date: 01/26/2024 CLINICAL DATA:  Mental status change, unknown cause; Neck trauma, focal neuro deficit or paresthesia (Age 59-64y) EXAM: CT HEAD WITHOUT CONTRAST CT CERVICAL SPINE WITHOUT CONTRAST TECHNIQUE: Multidetector CT imaging of the head and cervical spine was performed following the standard protocol without intravenous contrast. Multiplanar CT image reconstructions of the cervical spine were also generated. RADIATION DOSE REDUCTION: This exam was performed according to the departmental dose-optimization program which includes automated exposure control, adjustment of the mA and/or kV according to patient size and/or use of iterative reconstruction technique. COMPARISON:  None Available. FINDINGS: CT HEAD FINDINGS Brain: No hemorrhage. No hydrocephalus. No  extra-axial fluid collection. No mass effect. No mass lesion. 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 Vascular: No hyperdense vessel or unexpected calcification. Skull: Soft tissue swelling along the left frontal scalp. No evidence of an underlying calvarial fracture. Sinuses/Orbits: No middle ear or mastoid effusion. Paranasal sinuses are clear. Orbits are unremarkable Other: None. CT CERVICAL SPINE FINDINGS Alignment: Normal. Skull base and vertebrae: No acute fracture. No primary bone lesion or focal pathologic process. Soft tissues and spinal canal: No prevertebral fluid or swelling. No visible canal hematoma. Disc levels:  CT evidence of high-grade spinal canal stenosis Upper chest: Negative. Other: Partially imaged endotracheal and enteric tubes in place. IMPRESSION: 1. No CT evidence of intracranial injury. 2. 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 If there is clinical concern for an acute infarct, consider further evaluation with MRI. 3. Soft tissue swelling along the left frontal scalp. No evidence of an underlying calvarial fracture. 4. No acute fracture or traumatic subluxation of the cervical spine. Electronically Signed   By: Lorenza Cambridge M.D.   On: 01/26/2024 10:46   DG Chest Port 1 View Result Date: 01/26/2024 CLINICAL DATA:  Sepsis. EXAM: PORTABLE CHEST 1 VIEW COMPARISON:  Chest radiograph dated 07/20/2021. FINDINGS: Shallow inspiration with minimal bibasilar atelectasis. No focal consolidation, pleural effusion, or pneumothorax. Mild cardiomegaly with mild central vascular congestion. No  acute osseous pathology. IMPRESSION: Mild cardiomegaly with mild central vascular congestion. No focal consolidation. Electronically Signed   By: Elgie Collard M.D.   On: 01/26/2024 10:14    ECHO as above  TELEMETRY reviewed by me 01/28/2024: sinus rhythm rate 80s  EKG reviewed by me: Sinus rhythm rate 82 bpm  Data reviewed by me 01/28/2024: last 24h vitals tele labs imaging I/O PCCM  notes  Principal Problem:   Septic shock (HCC)    ASSESSMENT AND PLAN:  ARIAHNA SMIDDY is a 58 y.o. female  with a past medical history of hypertension, hyperlipidemia, type 2 diabetes, anemia, endometrial cancer, OSA, GERD, NASH who presented to the ED on 01/26/2024 for unresponsiveness, found down by family and EMS brought her to the ED.  Troponins elevated.  Cardiology was consulted for further evaluation.   # NSTEMI # Septic shock # Acute metabolic encephalopathy # Anemia Patient brought to the ED from home after being found unresponsive and hypotensive. Intubated in the ED. Placed on pressors and treated for septic shock, found to be bacteremic. Extubated today 01/28/24. Troponins trended 179 > 591 > 886 > 1349 > 2680 > 4087 > 3717. Echo this admission with preserved EF. Patient with intermittent CP for 6 months, none at present.  -Hold further heparin given anemia.  S/p blood transfusion this morning. -Given bacteremia, would defer heart catheterization at this time.  Will plan to treat non-STEMI medically and can consider setting patient up for catheterization outpatient once infection appropriately treated. -Continue aspirin 81 mg daily.  Continue Crestor 20 mg daily. -Remains on norepinephrine and vasopressin, wean off as able.  Once stable off of pressors can consider resuming home BP meds. -Further management of septic shock as per PCCM.   This patient's plan of care was discussed and created with Dr. Darrold Junker and he is in agreement.  Signed: Gale Journey, PA-C  01/28/2024, 10:54 AM [facility]

## 2024-01-28 NOTE — Progress Notes (Signed)
 [facility]  Date of Admission:  01/26/2024   Total days of inpatient antibiotics 2  Principal Problem:   Septic shock [facility])          Assessment: 58 year old female with history of sleep disorder on Ambien, MDD followed by psychiatry, social anxiety disorder, psoriasis, cirrhosis was found down by family admitted with #Klebsiella oxytoca bacteremia secondary UTI +/- PNA #Cirrhosis with portal hypertension - Patient was hypotensive unresponsive, mechanically intubated on arrival. - Blood cultures grew Klebsiella oxytoca.  UA showed small leukocytes, negative nitrites.  CT abdomen pelvis showed cirrhosis with portal hypertension, no radiographic evidence of urinary tract infection noted on imaging.  Small volume abdominal pelvic ascites noted.  Trace left pleural effusion with adjacent primary consolidation. - RVP negative.  CT head noted soft tissue swelling of left scalp.  Age-indeterminate left cerebral infarct. -Urine CX+ Klebsiella. She states she had dysuyria x 1 week PTA. Recommendations: -Follow blood Cx sens->hopefully transition to PO to complete abx course -Follow resp Cx -Continue ceftriaxone  Evaluation of this patient requires complex antimicrobial therapy evaluation and counseling + isolation needs for disease transmission risk assessment and mitigation    Microbiology:   Antibiotics: Ceftriaxone 2/25- Azithromycin 2/25- Refaxamin 2/25 Vancomycin 2/25   Cultures: Blood 2/25 1/2 Klebsiella  oxytoca Urine 2/25  Other 2/25 Rsp Cx re-incuabting   SUBJECTIVE: Restin in bed. No new complaints Interval: Afebrile overnight. Wbc   Review of Systems: Review of Systems  All other systems reviewed and are negative.    Scheduled Meds:  vitamin C  500 mg Oral BID   aspirin  81 mg Oral Daily   Chlorhexidine Gluconate Cloth  6 each Topical Daily   feeding supplement (GLUCERNA SHAKE)  237 mL Oral TID BM   Gerhardt's butt cream    Topical BID   hydrocortisone sod succinate (SOLU-CORTEF) inj  50 mg Intravenous Q6H   insulin aspart  0-20 Units Subcutaneous TID WC   insulin aspart  0-5 Units Subcutaneous QHS   lactulose  20 g Oral BID   levETIRAcetam  750 mg Oral BID   midodrine  10 mg Oral TID WC   multivitamin with minerals  1 tablet Oral Daily   mupirocin ointment  1 Application Nasal BID   pantoprazole  40 mg Oral Daily   rifaximin  550 mg Oral BID   rosuvastatin  20 mg Oral Daily   sodium chloride flush  10-40 mL Intracatheter Q12H   thiamine  100 mg Oral Daily   zinc sulfate (50mg  elemental zinc)  220 mg Oral Daily   Continuous Infusions:  cefTRIAXone (ROCEPHIN)  IV 2 g (01/28/24 1101)   norepinephrine (LEVOPHED) Adult infusion 10 mcg/min (01/28/24 1236)   sodium chloride (hypertonic) 20 mL/hr at 01/28/24 1016   vasopressin 0.03 Units/min (01/28/24 0829)   PRN Meds:.acetaminophen, docusate sodium, fentaNYL (SUBLIMAZE) injection, mouth rinse, polyethylene glycol, polyvinyl alcohol, sodium chloride flush No Known Allergies  OBJECTIVE: Vitals:   01/28/24 1230 01/28/24 1245 01/28/24 1300 01/28/24 1315  BP: (!) 93/55 (!) 102/57 (!) 103/51 114/67  Pulse: 77 91 90 91  Resp: 12 16 17  (!) 26  Temp:      TempSrc:      SpO2: 98% 94% 99% 97%  Weight:      Height:       Body mass index is 34.17 kg/m.  Physical Exam Constitutional:      Appearance: Normal appearance.  HENT:  Head: Normocephalic and atraumatic.     Right Ear: Tympanic membrane normal.     Left Ear: Tympanic membrane normal.     Nose: Nose normal.     Mouth/Throat:     Mouth: Mucous membranes are moist.  Eyes:     Extraocular Movements: Extraocular movements intact.     Conjunctiva/sclera: Conjunctivae normal.     Pupils: Pupils are equal, round, and reactive to light.  Cardiovascular:     Rate and Rhythm: Normal rate and regular rhythm.     Heart sounds: No murmur heard.    No friction rub. No gallop.  Pulmonary:     Effort:  Pulmonary effort is normal.     Breath sounds: Normal breath sounds.  Abdominal:     General: Abdomen is flat.     Palpations: Abdomen is soft.  Musculoskeletal:        General: Normal range of motion.  Skin:    General: Skin is warm and dry.  Neurological:     General: No focal deficit present.     Mental Status: She is alert and oriented to person, place, and time.  Psychiatric:        Mood and Affect: Mood normal.       Lab Results Lab Results  Component Value Date   WBC 25.6 (H) 01/28/2024   HGB 6.8 (L) 01/28/2024   HCT 21.0 (L) 01/28/2024   MCV 79.5 (L) 01/28/2024   PLT 243 01/28/2024    Lab Results  Component Value Date   CREATININE 1.19 (H) 01/28/2024   BUN 49 (H) 01/28/2024   NA 126 (L) 01/28/2024   K 4.2 01/28/2024   CL 87 (L) 01/28/2024   CO2 23 01/28/2024    Lab Results  Component Value Date   ALT 26 01/26/2024   AST 71 (H) 01/26/2024   ALKPHOS 204 (H) 01/26/2024   BILITOT 5.1 (H) 01/26/2024        Danelle Earthly, MD [facility] 01/28/2024, 1:54 PM

## 2024-01-28 NOTE — Consult Note (Addendum)
 PHARMACY CONSULT NOTE - Hypertonic Saline  Pharmacy Consult for Hypertonic Saline Monitoring and Management  Recent Labs: Potassium (mmol/L)  Date Value  01/28/2024 4.2   Magnesium (mg/dL)  Date Value  57/84/6962 2.3   Calcium (mg/dL)  Date Value  95/28/4132 8.5 (L)   Albumin (g/dL)  Date Value  44/12/270 2.5 (L)  11/17/2018 3.9   Phosphorus (mg/dL)  Date Value  53/66/4403 2.3 (L)   Sodium (mmol/L)  Date Value  01/28/2024 121 (L)  11/17/2018 141   Assessment  Abigail Molina is a 58 y.o. female found down by family admitted with acute hyponatremia. Pharmacy has been consulted to monitor hypertonic saline (3%) infusion.   Patient was previously on hypertonic saline when first admitted for sodium of 115. Restart per nephrology due to sodium of 121 today.   Goal of Therapy:  Increase in Na by 4-6 mEq/L in 4-6 hours Do not exceed increase in Na by 10-12 mEq/L in 24 hours  Monitoring:  Date Time Na Rate/Comment  02/27   0551   121      20 mL/hr  02/27   1008   122      20 mL/hr  02/27   1127   126      20 mL/hr  02/27   1644   122      20 mL/hr  02/27   1903   124      20 mL/hr   Plan:  Continue 3% saline at 20 mL/hr Check Na Q2H x 2 then Q4H  Stop infusion if  Na increases > 4 mEq/L in first 2 hours Na increases > 6 mEq/L in first 4 hours Na increases > 6 mEq/L in 6 hours  Na increases > 8 mEq/L in 8 hours (give D5W bolus) Continue to monitor for signs of clinical improvement and recommendations per nephrology 0227 1230: Nephro aware of 4 mEq/L increase in 1.5 hours and okay with rate  Littie Deeds, PharmD Pharmacy Resident  01/28/2024 9:56 AM

## 2024-01-28 NOTE — Progress Notes (Signed)
 PHARMACY CONSULT NOTE - FOLLOW UP  Pharmacy Consult for Electrolyte Monitoring and Replacement   Recent Labs: Potassium (mmol/L)  Date Value  01/28/2024 4.2   Magnesium (mg/dL)  Date Value  40/98/1191 2.3   Calcium (mg/dL)  Date Value  47/82/9562 8.5 (L)   Albumin (g/dL)  Date Value  13/07/6577 2.5 (L)  11/17/2018 3.9   Phosphorus (mg/dL)  Date Value  46/96/2952 2.3 (L)   Sodium (mmol/L)  Date Value  01/28/2024 121 (L)  11/17/2018 141   Assessment: Patient is a 58 year old female with a past medical history of chronic pain syndrome, cirrhosis, and diabetes who was found unresponsive on her couch this morning. Pharmacy has been consulted to monitor and replace electrolytes as needed.  Goal of Therapy:  Electrolytes WNL Today, 01/28/2024 K = 4.2 Mg = 2.3 Phos = 2.3  Plan:  No replacement indicated at this time Continue to monitor electrolytes daily  Effie Shy, PharmD Pharmacy Resident  01/28/2024 6:48 AM

## 2024-01-28 NOTE — Consult Note (Signed)
 PHARMACY - ANTICOAGULATION CONSULT NOTE  Pharmacy Consult for IV Heparin Indication: chest pain/ACS  Patient Measurements: Height: 5\' 4"  (162.6 cm) Weight: 90.3 kg (199 lb 1.2 oz) IBW/kg (Calculated) : 54.7 Heparin Dosing Weight: 74.4 kg  Labs: Recent Labs    01/26/24 0834 01/26/24 1153 01/26/24 2038 01/27/24 0010 01/27/24 0414 01/27/24 0802 01/27/24 2234 01/28/24 0454  HGB 7.2*  --  8.0*  --  8.2*  --   --  6.6*  HCT 21.9*  --  25.2*  --  24.9*  --   --  20.5*  PLT 185  --  385  --  416*  --   --  243  APTT 34  --   --   --   --   --   --   --   LABPROT 18.8*  --   --   --   --   --   --   --   INR 1.6*  --   --   --   --   --   --   --   HEPARINUNFRC  --   --   --   --   --   --  0.24* 0.43  CREATININE 2.41*   < > 1.78* 1.69* 1.47*  --   --   --   TROPONINIHS  --    < > 1,349* 2,680* 4,087* 3,717*  --   --    < > = values in this interval not displayed.    Estimated Creatinine Clearance: 44.8 mL/min (A) (by C-G formula based on SCr of 1.47 mg/dL (H)).   Medical History: Past Medical History:  Diagnosis Date   Anemia    Anxiety    Arthritis    Cancer (HCC)    Endometrial cancer   Chronic pain    Cirrhosis of liver (HCC)    Depression    Diabetes mellitus without complication (HCC)    Fatty liver disease, nonalcoholic 2021   GERD (gastroesophageal reflux disease)    Glaucoma    Hyperlipidemia    Hypertension    Meningioma (HCC)    OSA (obstructive sleep apnea) 05/08/2023   Overactive bladder    Pharmacologic therapy 11/02/2023   Psoriasis 2015   Medications:  No anticoagulation prior to admission per my chart review  Assessment: 58 y/o F with medical history as above admitted with septic shock secondary to Klebsiella pneumoniae bacteremia complicated by NSTEMI. Pharmacy consulted to initiate and manage heparin infusion for ACS.  Baseline aPTT 34s, INR 1.6. CBC notable for stable anemia.  Goal of Therapy:  Heparin level 0.3-0.7 units/ml Monitor  platelets by anticoagulation protocol: Yes   Plan:  2/27:  HL @ 0454 = 0.48, therapeutic X 1 - Will continue pt on current rate and recheck HL in 6 hrs @ 1100.  --Daily CBC per protocol while on IV heparin  Swayzee Wadley D 01/28/2024,5:46 AM

## 2024-01-28 NOTE — Evaluation (Signed)
 Physical Therapy Evaluation Patient Details Name: Abigail Molina MRN: [id number] DOB: July 16, 1966 Today's Date: 01/28/2024  History of Present Illness  Patient is a 58 year old female found unresponsive, was intubated. Found to have septic shock, NSTEMI, acute metabolic encephalopathy. History of hypertension, hyperlipidemia, type 2 diabetes, anemia, endometrial cancer, OSA, GERD, NASH.  Clinical Impression  Patient is agreeable to PT session. She reports she is modified independent at baseline, using a rolling walker for ambulation. She lives with her cousin in a mobile home with steps to enter.  Today the patient required Max A to sit up on edge of bed. Sitting balance is fair with occasional posterior lean. Vitals stable throughout session. Activity tolerance is limited by fatigue. Recommend to continue PT to maximize independence and facilitate return to prior level of function. Consider rehabilitation < 3 hours/day after this hospital stay.       If plan is discharge home, recommend the following: Two people to help with walking and/or transfers;A lot of help with bathing/dressing/bathroom;Help with stairs or ramp for entrance;Assist for transportation   Can travel by private vehicle   No    Equipment Recommendations None recommended by PT  Recommendations for Other Services       Functional Status Assessment Patient has had a recent decline in their functional status and demonstrates the ability to make significant improvements in function in a reasonable and predictable amount of time.     Precautions / Restrictions Precautions Precautions: Fall Restrictions Weight Bearing Restrictions Per Provider Order: No      Mobility  Bed Mobility Overal bed mobility: Needs Assistance Bed Mobility: Sit to Supine, Supine to Sit     Supine to sit: Max assist Sit to supine: Max assist   General bed mobility comments: assistance for LE and trunk support    Transfers                    General transfer comment: not attempted due to poor sitting tolerance    Ambulation/Gait                  Stairs            Wheelchair Mobility     Tilt Bed    Modified Rankin (Stroke Patients Only)       Balance Overall balance assessment: Needs assistance Sitting-balance support: Feet supported Sitting balance-Leahy Scale: Fair Sitting balance - Comments: poor initially progressing to fair with increased sitting time Postural control: Posterior lean                                   Pertinent Vitals/Pain Pain Assessment Pain Assessment: Faces Faces Pain Scale: Hurts little more Pain Location: buttocks Pain Descriptors / Indicators: Discomfort Pain Intervention(s): Limited activity within patient's tolerance, Monitored during session, Repositioned    Home Living Family/patient expects to be discharged to:: Private residence Living Arrangements: Other relatives (cousin) Available Help at Discharge: Family;Available PRN/intermittently Type of Home: Mobile home Home Access: Stairs to enter Entrance Stairs-Rails: Right;Left Entrance Stairs-Number of Steps: 6   Home Layout: One level Home Equipment: Agricultural consultant (2 wheels);BSC/3in1      Prior Function Prior Level of Function : Independent/Modified Independent             Mobility Comments: using walker for ambulation ADLs Comments: independent     Extremity/Trunk Assessment   Upper Extremity Assessment Upper Extremity Assessment: Generalized weakness  Lower Extremity Assessment Lower Extremity Assessment: Generalized weakness       Communication   Communication Communication: No apparent difficulties    Cognition Arousal: Alert Behavior During Therapy: WFL for tasks assessed/performed   PT - Cognitive impairments: No apparent impairments                         Following commands: Intact       Cueing Cueing Techniques: Verbal cues      General Comments General comments (skin integrity, edema, etc.): mild dizziness initially with sitting upright. no dyspnea with exertion    Exercises     Assessment/Plan    PT Assessment Patient needs continued PT services  PT Problem List Decreased strength;Decreased range of motion;Decreased activity tolerance;Decreased balance;Decreased mobility;Decreased safety awareness       PT Treatment Interventions DME instruction;Gait training;Functional mobility training;Stair training;Therapeutic activities;Therapeutic exercise;Balance training;Cognitive remediation;Neuromuscular re-education;Patient/family education;Wheelchair mobility training    PT Goals (Current goals can be found in the Care Plan section)  Acute Rehab PT Goals Patient Stated Goal: to go home PT Goal Formulation: With patient Time For Goal Achievement: 02/11/24 Potential to Achieve Goals: Good    Frequency Min 1X/week     Co-evaluation               AM-PAC PT "6 Clicks" Mobility  Outcome Measure Help needed turning from your back to your side while in a flat bed without using bedrails?: A Lot Help needed moving from lying on your back to sitting on the side of a flat bed without using bedrails?: A Lot Help needed moving to and from a bed to a chair (including a wheelchair)?: A Lot Help needed standing up from a chair using your arms (e.g., wheelchair or bedside chair)?: Total Help needed to walk in hospital room?: Total Help needed climbing 3-5 steps with a railing? : Total 6 Click Score: 9    End of Session Equipment Utilized During Treatment: Oxygen Activity Tolerance: Patient limited by fatigue Patient left: in bed;with call bell/phone within reach;with bed alarm set Nurse Communication: Mobility status PT Visit Diagnosis: Muscle weakness (generalized) (M62.81);Unsteadiness on feet (R26.81)    Time: 1610-9604 PT Time Calculation (min) (ACUTE ONLY): 14 min   Charges:   PT Evaluation $PT Eval  Moderate Complexity: 1 Mod   PT General Charges $$ ACUTE PT VISIT: 1 Visit         Donna Bernard, PT, MPT  [facility] 01/28/2024, 12:34 PM

## 2024-01-28 NOTE — Plan of Care (Signed)

## 2024-01-29 DIAGNOSIS — K7460 Unspecified cirrhosis of liver: Secondary | ICD-10-CM

## 2024-01-29 DIAGNOSIS — B961 Klebsiella pneumoniae [K. pneumoniae] as the cause of diseases classified elsewhere: Secondary | ICD-10-CM | POA: Diagnosis not present

## 2024-01-29 DIAGNOSIS — R6521 Severe sepsis with septic shock: Secondary | ICD-10-CM | POA: Diagnosis not present

## 2024-01-29 DIAGNOSIS — A419 Sepsis, unspecified organism: Secondary | ICD-10-CM | POA: Diagnosis not present

## 2024-01-29 LAB — RENAL FUNCTION PANEL
Albumin: 2.7 g/dL — ABNORMAL LOW (ref 3.5–5.0)
Anion gap: 12 (ref 5–15)
BUN: 51 mg/dL — ABNORMAL HIGH (ref 6–20)
CO2: 23 mmol/L (ref 22–32)
Calcium: 8.8 mg/dL — ABNORMAL LOW (ref 8.9–10.3)
Chloride: 93 mmol/L — ABNORMAL LOW (ref 98–111)
Creatinine, Ser: 1 mg/dL (ref 0.44–1.00)
GFR, Estimated: >60 mL/min (ref >60)
Glucose, Bld: 228 mg/dL — ABNORMAL HIGH (ref 70–99)
Phosphorus: 2.8 mg/dL (ref 2.5–4.6)
Potassium: 3.6 mmol/L (ref 3.5–5.1)
Sodium: 128 mmol/L — ABNORMAL LOW (ref 135–145)

## 2024-01-29 LAB — CBC
HCT: 26.3 % — ABNORMAL LOW (ref 36.0–46.0)
Hemoglobin: 8.6 g/dL — ABNORMAL LOW (ref 12.0–15.0)
MCH: 26.3 pg (ref 26.0–34.0)
MCHC: 32.7 g/dL (ref 30.0–36.0)
MCV: 80.4 fL (ref 80.0–100.0)
Platelets: 159 10*3/uL (ref 150–400)
RBC: 3.27 MIL/uL — ABNORMAL LOW (ref 3.87–5.11)
RDW: 18.5 % — ABNORMAL HIGH (ref 11.5–15.5)
WBC: 9.4 10*3/uL (ref 4.0–10.5)
nRBC: 0.2 % (ref 0.0–0.2)

## 2024-01-29 LAB — GLUCOSE, CAPILLARY
Glucose-Capillary: 161 mg/dL — ABNORMAL HIGH (ref 70–99)
Glucose-Capillary: 177 mg/dL — ABNORMAL HIGH (ref 70–99)
Glucose-Capillary: 182 mg/dL — ABNORMAL HIGH (ref 70–99)
Glucose-Capillary: 185 mg/dL — ABNORMAL HIGH (ref 70–99)
Glucose-Capillary: 196 mg/dL — ABNORMAL HIGH (ref 70–99)
Glucose-Capillary: 202 mg/dL — ABNORMAL HIGH (ref 70–99)
Glucose-Capillary: 203 mg/dL — ABNORMAL HIGH (ref 70–99)
Glucose-Capillary: 302 mg/dL — ABNORMAL HIGH (ref 70–99)

## 2024-01-29 LAB — CULTURE, BLOOD (ROUTINE X 2)

## 2024-01-29 LAB — TYPE AND SCREEN
ABO/RH(D): A POS
Antibody Screen: NEGATIVE
Unit division: 0
Unit division: 0

## 2024-01-29 LAB — SODIUM
Sodium: 128 mmol/L — ABNORMAL LOW (ref 135–145)
Sodium: 130 mmol/L — ABNORMAL LOW (ref 135–145)
Sodium: 128 mmol/L — ABNORMAL LOW (ref 135–145)
Sodium: 132 mmol/L — ABNORMAL LOW (ref 135–145)

## 2024-01-29 LAB — CULTURE, RESPIRATORY W GRAM STAIN: Culture: NORMAL

## 2024-01-29 LAB — BPAM RBC
Blood Product Expiration Date: 202503292359
Blood Product Expiration Date: 202503292359
ISSUE DATE / TIME: 202502270606
ISSUE DATE / TIME: 202502271641
Unit Type and Rh: 6200
Unit Type and Rh: 6200

## 2024-01-29 LAB — MAGNESIUM: Magnesium: 2.6 mg/dL — ABNORMAL HIGH (ref 1.7–2.4)

## 2024-01-29 LAB — HEPATITIS A ANTIBODY, TOTAL: hep A Total Ab: NONREACTIVE

## 2024-01-29 MED ORDER — MIRTAZAPINE 15 MG PO TABS
15.0000 mg | ORAL_TABLET | Freq: Every day | ORAL | Status: DC
Start: 2024-01-29 — End: 2024-02-08
  Administered 2024-01-29 – 2024-02-07 (×10): 15 mg via ORAL
  Filled 2024-01-29 (×11): qty 1

## 2024-01-29 MED ORDER — BUPROPION HCL ER (XL) 150 MG PO TB24
150.0000 mg | ORAL_TABLET | Freq: Every day | ORAL | Status: DC
Start: 2024-01-30 — End: 2024-02-08
  Administered 2024-01-30 – 2024-02-08 (×10): 150 mg via ORAL
  Filled 2024-01-29 (×10): qty 1

## 2024-01-29 MED ORDER — HYDROCORTISONE SOD SUC (PF) 100 MG IJ SOLR
50.0000 mg | Freq: Two times a day (BID) | INTRAMUSCULAR | Status: DC
  Administered 2024-01-29 – 2024-01-30 (×2): 50 mg via INTRAVENOUS
  Filled 2024-01-29 (×2): qty 2

## 2024-01-29 MED ORDER — CITALOPRAM HYDROBROMIDE 20 MG PO TABS
20.0000 mg | ORAL_TABLET | Freq: Every day | ORAL | Status: DC
  Administered 2024-01-30 – 2024-02-08 (×10): 20 mg via ORAL
  Filled 2024-01-29 (×10): qty 1

## 2024-01-29 NOTE — Progress Notes (Addendum)
 [facility] CLINIC CARDIOLOGY PROGRESS NOTE       Patient ID: Abigail Molina MRN: [id number] DOB/AGE: 60-Apr-1967 60 y.o.  Admit date: 01/26/2024 Referring Physician Harlon Ditty, NP Primary Physician Care, [place] [facility]  Primary Cardiologist None Reason for Consultation NSTEMI  HPI: Abigail Molina is a 58 y.o. female  with a past medical history of hypertension, hyperlipidemia, type 2 diabetes, anemia, endometrial cancer, OSA, GERD, NASH who presented to the ED on 01/26/2024 for unresponsiveness, found down by family and EMS brought her to the ED.  Troponins elevated.  Cardiology was consulted for further evaluation.   Interval history: -Patient seen and examined this morning, resting comfortably in bed. -She denies any dizziness symptoms.  Pressors weaning off. -Denies any chest pain or shortness of breath symptoms.  Remains on 2 L supplemental O2. -Hgb stable today.  Review of systems complete and found to be negative unless listed above    Past Medical History:  Diagnosis Date   Anemia    Anxiety    Arthritis    Cancer (HCC)    Endometrial cancer   Chronic pain    Cirrhosis of liver (HCC)    Depression    Diabetes mellitus without complication (HCC)    Fatty liver disease, nonalcoholic 2021   GERD (gastroesophageal reflux disease)    Glaucoma    Hyperlipidemia    Hypertension    Meningioma (HCC)    OSA (obstructive sleep apnea) 05/08/2023   Overactive bladder    Pharmacologic therapy 11/02/2023   Psoriasis 2015    Past Surgical History:  Procedure Laterality Date   ABDOMINAL HYSTERECTOMY  2007   CHOLECYSTECTOMY  2000    Medications Prior to Admission  Medication Sig Dispense Refill Last Dose/Taking   aspirin 81 MG tablet Take 81 mg by mouth daily.   01/25/2024   Baclofen 5 MG TABS Take 2 tablets by mouth at bedtime.   01/25/2024   buPROPion (WELLBUTRIN XL) 150 MG 24 hr tablet Take 1 tablet (150 mg total) by mouth daily. 30 tablet 3 01/25/2024   canagliflozin (INVOKANA)  300 MG TABS tablet Take by mouth.   01/25/2024   carvedilol (COREG) 6.25 MG tablet Take 6.25 mg by mouth 2 (two) times daily.   01/25/2024   citalopram (CELEXA) 20 MG tablet Take 20 mg by mouth daily.   01/25/2024   Continuous Glucose Sensor (FREESTYLE LIBRE 3 SENSOR) MISC by Does not apply route daily.   01/26/2024   COSENTYX SENSOREADY, 300 MG, 150 MG/ML SOAJ Inject into the skin.   Taking   Ferrous Sulfate (SLOW FE) 142 (45 Fe) MG TBCR Take by mouth.   01/26/2024   furosemide (LASIX) 40 MG tablet Take 40 mg by mouth 2 (two) times daily.   01/25/2024   gabapentin (NEURONTIN) 300 MG capsule Take by mouth once.   01/25/2024   insulin lispro (HUMALOG) 100 UNIT/ML KwikPen Inject into the skin.   01/25/2024   lactulose (CHRONULAC) 10 GM/15ML solution SMARTSIG:Milliliter(s) By Mouth   Past Week   LANTUS SOLOSTAR 100 UNIT/ML Solostar Pen Inject 66 Units into the skin daily.   01/25/2024   levETIRAcetam (KEPPRA) 500 MG tablet Take 750 mg by mouth 2 (two) times daily.   Taking   MAGNESIUM-OXIDE 400 (240 Mg) MG tablet Take 2 tablets by mouth 2 (two) times daily.   01/25/2024   metFORMIN (GLUCOPHAGE-XR) 500 MG 24 hr tablet Take 1,000 mg by mouth 2 (two) times daily.   01/25/2024   mirtazapine (REMERON) 15 MG  tablet Take 1 tablet (15 mg total) by mouth at bedtime. 30 tablet 0 01/25/2024   ondansetron (ZOFRAN-ODT) 4 MG disintegrating tablet Dissolve 1 tablet (4 mg total) in the mouth every twelve (12) hours as needed for nausea.   Past Week   pantoprazole (PROTONIX) 40 MG tablet TAKE ONE TABLET BY MOUTH EVERY DAY 90 tablet 0 01/25/2024   promethazine (PHENERGAN) 12.5 MG tablet Take by mouth.   Taking   scopolamine (TRANSDERM-SCOP) 1 MG/3DAYS Place 1 patch onto the skin every 3 (three) days.   Taking   spironolactone (ALDACTONE) 100 MG tablet Take 100 mg by mouth 2 (two) times daily.   01/25/2024   XIFAXAN 550 MG TABS tablet Take 550 mg by mouth 2 (two) times daily.   01/25/2024   zolpidem (AMBIEN) 5 MG tablet Take 1  tablet (5 mg total) by mouth at bedtime as needed for sleep. 30 tablet 1 Past Week   Accu-Chek Softclix Lancets lancets SMARTSIG:Topical      BD PEN NEEDLE NANO 2ND GEN 32G X 4 MM MISC USE 1 NIGHTLY      chlorhexidine (PERIDEX) 0.12 % solution SMARTSIG:0.5 Capful(s) By Mouth Morning-Evening (Patient not taking: Reported on 01/26/2024)   Not Taking   clotrimazole (MYCELEX) 10 MG troche Take 10 mg by mouth 5 (five) times daily. (Patient not taking: Reported on 01/26/2024)   Not Taking   Continuous Glucose Receiver (DEXCOM G7 RECEIVER) DEVI USE AS INSTRUCTED WITH G7 SENSORS (Patient not taking: Reported on 01/26/2024)   Not Taking   Continuous Glucose Sensor (DEXCOM G7 SENSOR) MISC USE TO MONITOR BLOOD GLUCOSE LEVELS CONTINUOUSLY. CHANGE SENSOR EVERY 10 DAYS. (Patient not taking: Reported on 01/26/2024)   Not Taking   Dulaglutide (TRULICITY) 1.5 MG/0.5ML SOPN Inject 1.5 mg into the skin once a week. Thursdays (Patient not taking: Reported on 01/26/2024)   Not Taking   insulin glargine-yfgn (SEMGLEE, YFGN,) 100 UNIT/ML Pen INJECT 50 UNITS UNDER THE SKIN NIGHTLY AS DIRECTED BY YOUR DOCTOR AROUND SAME TIME EVERY DAY. TOTAL DAILY DOSE NOT TO EXCEED 50 UNITS (Patient not taking: Reported on 01/26/2024)   Not Taking   Insulin Pen Needle 32G X 6 MM MISC 1 Syringe by Does not apply route. Use with Victoza.      naltrexone (DEPADE) 50 MG tablet Take 50 mg by mouth daily.      Secukinumab (COSENTYX SENSOREADY PEN) 150 MG/ML SOAJ Inject the contents of 2 pens (300 mg total) under the skin once a week at weeks 0, 1, 2, 3, and 4. THEN inject the contents of 2 pens (300 mg total) every 4 weeks thereafter. (Patient not taking: Reported on 01/26/2024)   Not Taking   Sharps Container (BD SHARPS COLLECTOR) MISC Use as directed to dispose of Cosentyx pens.      Social History   Socioeconomic History   Marital status: Widowed    Spouse name: Not on file   Number of children: Not on file   Years of education: Not on file    Highest education level: Associate degree: academic program  Occupational History   Not on file  Tobacco Use   Smoking status: Never   Smokeless tobacco: Never  Vaping Use   Vaping status: Never Used  Substance and Sexual Activity   Alcohol use: Not Currently    Comment:  Rarely. One or two drinks a year    Drug use: No   Sexual activity: Not Currently    Birth control/protection: Abstinence  Other Topics Concern  Not on file  Social History Narrative   PT is not working right now. Pt worries about financial needs since her husband died in 05-13-2024 and she has no income. She applied for Widow's benefits in July but has not heard any information and the application is still pending. If possible please inquire about application status.     Social Drivers of Corporate investment banker Strain: Low Risk  (12/16/2023)   Received from [facility]   Overall Financial Resource Strain (CARDIA)    Difficulty of Paying Living Expenses: Not very hard  Food Insecurity: Patient Unable To Answer (01/26/2024)   Hunger Vital Sign    Worried About Running Out of Food in the Last Year: Patient unable to answer    Ran Out of Food in the Last Year: Patient unable to answer  Transportation Needs: Patient Unable To Answer (01/26/2024)   PRAPARE - Transportation    Lack of Transportation (Medical): Patient unable to answer    Lack of Transportation (Non-Medical): Patient unable to answer  Physical Activity: Insufficiently Active (01/09/2021)   Received [facility]   Exercise Vital Sign    Days of Exercise per Week: 1 day    Minutes of Exercise per Session: 40 min  Stress: Stress Concern Present (09/17/2022)   Received from [facility], [facility] of Occupational Health - Occupational Stress Questionnaire    Feeling of Stress : Very much  Social Connections: Moderately Isolated (09/02/2018)   Social Connection and Isolation Panel [NHANES]     Frequency of Communication with Friends and Family: Once a week    Frequency of Social Gatherings with Friends and Family: Twice a week    Attends Religious Services: Never    Database administrator or Organizations: No    Attends Banker Meetings: Never    Marital Status: Widowed  Intimate Partner Violence: Patient Unable To Answer (01/26/2024)   Humiliation, Afraid, Rape, and Kick questionnaire    Fear of Current or Ex-Partner: Patient unable to answer    Emotionally Abused: Patient unable to answer    Physically Abused: Patient unable to answer    Sexually Abused: Patient unable to answer    Family History  Problem Relation Age of Onset   Diabetes Mother    Hypertension Mother    Depression Mother    Obesity Mother    Vision loss Mother    Alcohol abuse Cousin    Bipolar disorder Cousin    Heart disease Maternal Grandfather    Alcohol abuse Maternal Grandmother    Stroke Paternal Grandfather      Vitals:   01/29/24 0500 01/29/24 0600 01/29/24 0700 01/29/24 0822  BP: (!) 103/59 108/65 99/61 (!) 94/52  Pulse: 81 82 78 (!) 168  Resp: 11 13 13 12   Temp:    97.9 F (36.6 C)  TempSrc:    Oral  SpO2: 99% 98% 99% 98%  Weight:      Height:        PHYSICAL EXAM General: Ill appearing female, well nourished, in no acute distress. HEENT: Normocephalic and atraumatic. Neck: No JVD.  Lungs: Normal respiratory effort on room air. Clear bilaterally to auscultation. No wheezes, crackles, rhonchi.  Heart: HRRR. Normal S1 and S2 without gallops or murmurs.  Abdomen: Non-distended appearing.  Msk: Normal strength and tone for age. Extremities: Warm and well perfused. No clubbing, cyanosis. No edema.  Neuro: Alert and oriented  X 3. Psych: Answers questions appropriately.   Labs: Basic Metabolic Panel: Recent Labs    01/28/24 0454 01/28/24 1008 01/29/24 0334 01/29/24 0822  NA 121*   < > 128* 128*  K 4.2  --  3.6  --   CL 87*  --  93*  --   CO2 23  --  23  --    GLUCOSE 151*  --  228*  --   BUN 49*  --  51*  --   CREATININE 1.19*  --  1.00  --   CALCIUM 8.5*  --  8.8*  --   MG 2.3  --  2.6*  --   PHOS 2.3*  --  2.8  --    < > = values in this interval not displayed.   Liver Function Tests: Recent Labs    01/28/24 0454 01/29/24 0334  ALBUMIN 2.5* 2.7*   No results for input(s): "LIPASE", "AMYLASE" in the last 72 hours. CBC: Recent Labs    01/26/24 2038 01/27/24 0414 01/28/24 0454 01/28/24 1250 01/28/24 2257 01/29/24 0334  WBC 58.6* 50.9* 25.6*  --   --  9.4  NEUTROABS 53.3* 43.5*  --   --   --   --   HGB 8.0* 8.2* 6.6*   < > 8.1* 8.6*  HCT 25.2* 24.9* 20.5*   < > 24.5* 26.3*  MCV 82.6 81.4 79.5*  --   --  80.4  PLT 385 416* 243  --   --  159   < > = values in this interval not displayed.   Cardiac Enzymes: Recent Labs    01/27/24 0010 01/27/24 0414 01/27/24 0802  TROPONINIHS 2,680* 4,087* 3,717*   BNP: Recent Labs    01/26/24 1154  BNP 398.2*   D-Dimer: No results for input(s): "DDIMER" in the last 72 hours. Hemoglobin A1C: Recent Labs    01/26/24 1415  HGBA1C 8.5*   Fasting Lipid Panel: No results for input(s): "CHOL", "HDL", "LDLCALC", "TRIG", "CHOLHDL", "LDLDIRECT" in the last 72 hours. Thyroid Function Tests: No results for input(s): "TSH", "T4TOTAL", "T3FREE", "THYROIDAB" in the last 72 hours.  Invalid input(s): "FREET3" Anemia Panel: No results for input(s): "VITAMINB12", "FOLATE", "FERRITIN", "TIBC", "IRON", "RETICCTPCT" in the last 72 hours.   Radiology: EEG adult Result Date: 01/26/2024 Charlsie Quest, MD     01/26/2024  4:46 PM Patient Name: Abigail Molina MRN: [id number] Epilepsy Attending: Charlsie Quest Referring Physician/Provider: Ezequiel Essex, NP Date: 01/26/2024 Duration: 32.31 mins Patient history: 58yo female with ams. EEG to evaluate for seizure Level of alertness: comatose AEDs during EEG study: LEV Technical aspects: This EEG study was done with scalp electrodes positioned according to  the 10-20 International system of electrode placement. Electrical activity was reviewed with band pass filter of 1-70Hz , sensitivity of 7 uV/mm, display speed of 38mm/sec with a 60Hz  notched filter applied as appropriate. EEG data were recorded continuously and digitally stored.  Video monitoring was available and reviewed as appropriate. Description: EEG showed continuous generalized 3 to 5 Hz theta-delta slowing. Hyperventilation and photic stimulation were not performed.   ABNORMALITY - Continuous slow, generalized IMPRESSION: This study is suggestive of severe diffuse encephalopathy. No seizures or epileptiform discharges were seen throughout the recording. Charlsie Quest   ECHOCARDIOGRAM COMPLETE Result Date: 01/26/2024    ECHOCARDIOGRAM REPORT   Patient Name:   Abigail Molina Date of Exam: 01/26/2024 Medical Rec #:  [id number]    Height:       64.0  in Accession #:    [id number]   Weight:       212.0 lb Date of Birth:  February 21, 1966    BSA:          2.005 m Patient Age:    57 years     BP:           85/65 mmHg Patient Gender: F            HR:           Not listed in chart bpm. Exam Location:  [facility] Procedure: 2D Echo, Cardiac Doppler and Color Doppler (Both Spectral and Color            Flow Doppler were utilized during procedure). Indications:     Abnormal ECG R94.31  History:         Patient has no prior history of Echocardiogram examinations.                  Risk Factors:Hypertension and Dyslipidemia. Anxiety.  Sonographer:     Cristela Blue Referring Phys:  [id number] Ezequiel Essex Diagnosing Phys: Yvonne Kendall MD  Sonographer Comments: Echo performed with patient supine and on artificial respirator and no parasternal window. Zoll pad over sternum. IMPRESSIONS  1. Left ventricular ejection fraction, by estimation, is >55%. The left ventricle has normal function. Left ventricular endocardial border not optimally defined to evaluate regional wall motion. Left ventricular diastolic parameters were normal.  2. Right  ventricular systolic function is normal. The right ventricular size is normal. Tricuspid regurgitation signal is inadequate for assessing PA pressure.  3. The mitral valve is normal in structure. No evidence of mitral valve regurgitation. No evidence of mitral stenosis.  4. The aortic valve was not well visualized. Aortic valve regurgitation is not visualized. No aortic stenosis is present. FINDINGS  Left Ventricle: Left ventricular ejection fraction, by estimation, is >55%. The left ventricle has normal function. Left ventricular endocardial border not optimally defined to evaluate regional wall motion. Strain imaging was not performed. The left ventricular internal cavity size was normal in size. There is borderline left ventricular hypertrophy. Left ventricular diastolic parameters were normal. Right Ventricle: The right ventricular size is normal. No increase in right ventricular wall thickness. Right ventricular systolic function is normal. Tricuspid regurgitation signal is inadequate for assessing PA pressure. Left Atrium: Left atrial size was normal in size. Right Atrium: Right atrial size was normal in size. Pericardium: There is no evidence of pericardial effusion. Mitral Valve: The mitral valve is normal in structure. No evidence of mitral valve regurgitation. No evidence of mitral valve stenosis. MV peak gradient, 13.0 mmHg. The mean mitral valve gradient is 4.0 mmHg. Tricuspid Valve: The tricuspid valve is normal in structure. Tricuspid valve regurgitation is trivial. Aortic Valve: The aortic valve was not well visualized. Aortic valve regurgitation is not visualized. No aortic stenosis is present. Aortic valve mean gradient measures 3.0 mmHg. Aortic valve peak gradient measures 5.3 mmHg. Aortic valve area, by VTI measures 3.79 cm. Pulmonic Valve: The pulmonic valve was not well visualized. Pulmonic valve regurgitation is not visualized. No evidence of pulmonic stenosis. Aorta: The aortic root is normal  in size and structure. Pulmonary Artery: The pulmonary artery is not well seen. Venous: IVC assessment for right atrial pressure unable to be performed due to mechanical ventilation. IAS/Shunts: No atrial level shunt detected by color flow Doppler. Additional Comments: 3D imaging was not performed.  LEFT VENTRICLE PLAX 2D LVIDd:  3.50 cm   Diastology LVIDs:         2.60 cm   LV e' medial:    12.90 cm/s LV PW:         1.10 cm   LV E/e' medial:  6.0 LV IVS:        1.00 cm   LV e' lateral:   16.90 cm/s LVOT diam:     2.00 cm   LV E/e' lateral: 4.6 LV SV:         63 LV SV Index:   31 LVOT Area:     3.14 cm  RIGHT VENTRICLE RV Basal diam:  3.40 cm RV Mid diam:    3.50 cm RV S prime:     15.30 cm/s TAPSE (M-mode): 2.3 cm LEFT ATRIUM           Index        RIGHT ATRIUM           Index LA diam:      3.30 cm 1.65 cm/m   RA Area:     15.10 cm LA Vol (A2C): 19.1 ml 9.52 ml/m   RA Volume:   33.10 ml  16.51 ml/m LA Vol (A4C): 26.1 ml 13.01 ml/m  AORTIC VALVE AV Area (Vmax):    2.92 cm AV Area (Vmean):   3.13 cm AV Area (VTI):     3.79 cm AV Vmax:           115.00 cm/s AV Vmean:          85.900 cm/s AV VTI:            0.165 m AV Peak Grad:      5.3 mmHg AV Mean Grad:      3.0 mmHg LVOT Vmax:         107.00 cm/s LVOT Vmean:        85.600 cm/s LVOT VTI:          0.199 m LVOT/AV VTI ratio: 1.21  AORTA Ao Root diam: 3.10 cm MITRAL VALVE MV Area (PHT): 5.75 cm    SHUNTS MV Area VTI:   2.10 cm    Systemic VTI:  0.20 m MV Peak grad:  13.0 mmHg   Systemic Diam: 2.00 cm MV Mean grad:  4.0 mmHg MV Vmax:       1.80 m/s MV Vmean:      88.2 cm/s MV Decel Time: 132 msec MV E velocity: 77.60 cm/s MV A velocity: 93.80 cm/s MV E/A ratio:  0.83 Cristal Deer End MD Electronically signed by Yvonne Kendall MD Signature Date/Time: 01/26/2024/4:06:41 PM    Final    DG Chest Port 1 View Result Date: 01/26/2024 CLINICAL DATA:  Central line readjustment. EXAM: PORTABLE CHEST 1 VIEW COMPARISON:  Chest x-ray from same day at 1247 hours.  FINDINGS: The patient remains rotated to the left. Interval retraction of the left internal jugular central venous catheter with the tip now in the distal SVC. Unchanged endotracheal and enteric tubes. Unchanged cardiomegaly and left retrocardiac opacity. No pleural effusion or pneumothorax. No acute osseous abnormality. IMPRESSION: 1. Interval retraction of the left internal jugular central venous catheter with the tip now in the distal SVC. Electronically Signed   By: Obie Dredge M.D.   On: 01/26/2024 14:11   DG Chest Port 1 View Result Date: 01/26/2024 CLINICAL DATA:  Central line placement. EXAM: PORTABLE CHEST 1 VIEW COMPARISON:  Chest x-ray from same day at 0950 hours. FINDINGS: The patient is rotated to the  left. New left internal jugular central venous catheter with tip in the proximal right atrium. Unchanged endotracheal and enteric tubes. Unchanged cardiomegaly. Unchanged retrocardiac left lower lobe density. Clear right lung. No large pleural effusion or pneumothorax. No acute osseous abnormality. IMPRESSION: 1. New left internal jugular central venous catheter with tip in the proximal right atrium. No pneumothorax. 2. Unchanged retrocardiac left lower lobe atelectasis or pneumonia. Electronically Signed   By: Obie Dredge M.D.   On: 01/26/2024 14:10   DG Abdomen 1 View Result Date: 01/26/2024 CLINICAL DATA:  post og tube placement EXAM: ABDOMEN - 1 VIEW COMPARISON:  Chest XR and CT AP, concurrent. FINDINGS: Support lines: Enteric decompression tube, with tube tip and side port within stomach. The imaged bowel gas pattern is normal. Cholecystectomy clips. No radio-opaque calculi or other significant radiographic abnormality. IMPRESSION: 1. NG tube, well-positioned with tip and side port within the stomach. 2. Nonobstructed bowel-gas pattern. Electronically Signed   By: Roanna Banning M.D.   On: 01/26/2024 11:44   DG Chest Portable 1 View Result Date: 01/26/2024 CLINICAL DATA:  post  intubation EXAM: PORTABLE CHEST 1 VIEW COMPARISON:  Chest XR, 01/26/2024.  CT AP, concurrent. FINDINGS: Support lines: Intubation with ETT within the distal thoracic trachea, 3 cm from carina. Enteric decompression tube with tip extending outside field. Cardiomegaly. Aortic arch atherosclerosis. Mediastinum is within normal limits given technique and patient rotation. The RIGHT lung is clear. Patchy retrocardiac opacity. No large pleural effusion or pneumothorax. No acute displaced fracture. IMPRESSION: 1. Intubation with ETT well-positioned at the distal thoracic trachea. Additional lines and tubes, as above. 2. Cardiomegaly and Aortic Atherosclerosis (ICD10-I70.0). 3. Retrocardiac opacity is likely to represent atelectasis, however early pneumonia can appear similar. Electronically Signed   By: Roanna Banning M.D.   On: 01/26/2024 11:41   CT ABDOMEN PELVIS WO CONTRAST Result Date: 01/26/2024 CLINICAL DATA:  Sepsis EXAM: CT ABDOMEN AND PELVIS WITHOUT CONTRAST TECHNIQUE: Multidetector CT imaging of the abdomen and pelvis was performed following the standard protocol without IV contrast. RADIATION DOSE REDUCTION: This exam was performed according to the departmental dose-optimization program which includes automated exposure control, adjustment of the mA and/or kV according to patient size and/or use of iterative reconstruction technique. COMPARISON:  CT AP, 11/12/2012 and 08/10/2008. Chest XR and KUB, concurrent. FINDINGS: Lower chest: Trace LEFT pleural effusion with adjacent dependent patchy pulmonary consolidation. Hepatobiliary: Gross nodular contour liver. No focal abnormality. Cholecystectomy. No biliary dilatation. Pancreas: No pancreatic ductal dilatation or surrounding inflammatory changes. Spleen: Splenomegaly, measuring up to 17.0 cm Adrenals/Urinary Tract: Adrenal glands are unremarkable. Kidneys are normal, without renal calculi, focal lesion, or hydronephrosis. Bladder is decompressed by Foley  catheter. Stomach/Bowel: Stomach is within normal limits. Enteric decompression tube, with tip within the gastric body. Appendix appears normal. No evidence of bowel wall thickening, distention, or inflammatory changes. Vascular/Lymphatic: Aortic atherosclerosis without aneurysmal dilatation. No enlarged abdominal or pelvic lymph nodes. Reproductive: Hysterectomy.  No adnexal mass. Other: No abdominal wall hernia or abnormality. Trace mesenteric stranding. Small volume perihepatic, perisplenic, LEFT lower quadrant and deep pelvic ascites. Musculoskeletal: Mild body wall edema. No acute or significant osseous findings. IMPRESSION: Suboptimal evaluation, secondary to a lack of intravenous contrast. 1. No acute abdominopelvic process. 2. Cirrhosis with portal hypertension, as evidenced by splenomegaly. Small volume of abdominopelvic ascites. 3. Trace LEFT pleural effusion, with adjacent pulmonary consolidation. This may represent atelectasis, though early pneumonia can appear similar. Electronically Signed   By: Roanna Banning M.D.   On: 01/26/2024 11:20  CT HEAD WO CONTRAST ( ) Result Date: 01/26/2024 CLINICAL DATA:  Mental status change, unknown cause; Neck trauma, focal neuro deficit or paresthesia (Age 87-64y) EXAM: CT HEAD WITHOUT CONTRAST CT CERVICAL SPINE WITHOUT CONTRAST TECHNIQUE: Multidetector CT imaging of the head and cervical spine was performed following the standard protocol without intravenous contrast. Multiplanar CT image reconstructions of the cervical spine were also generated. RADIATION DOSE REDUCTION: This exam was performed according to the departmental dose-optimization program which includes automated exposure control, adjustment of the mA and/or kV according to patient size and/or use of iterative reconstruction technique. COMPARISON:  None Available. FINDINGS: CT HEAD FINDINGS Brain: No hemorrhage. No hydrocephalus. No extra-axial fluid collection. No mass effect. No mass lesion. 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 Vascular: No hyperdense vessel or unexpected calcification. Skull: Soft tissue swelling along the left frontal scalp. No evidence of an underlying calvarial fracture. Sinuses/Orbits: No middle ear or mastoid effusion. Paranasal sinuses are clear. Orbits are unremarkable Other: None. CT CERVICAL SPINE FINDINGS Alignment: Normal. Skull base and vertebrae: No acute fracture. No primary bone lesion or focal pathologic process. Soft tissues and spinal canal: No prevertebral fluid or swelling. No visible canal hematoma. Disc levels:  CT evidence of high-grade spinal canal stenosis Upper chest: Negative. Other: Partially imaged endotracheal and enteric tubes in place. IMPRESSION: 1. No CT evidence of intracranial injury. 2. 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 If there is clinical concern for an acute infarct, consider further evaluation with MRI. 3. Soft tissue swelling along the left frontal scalp. No evidence of an underlying calvarial fracture. 4. No acute fracture or traumatic subluxation of the cervical spine. Electronically Signed   By: Lorenza Cambridge M.D.   On: 01/26/2024 10:46   CT Cervical Spine Wo Contrast Result Date: 01/26/2024 CLINICAL DATA:  Mental status change, unknown cause; Neck trauma, focal neuro deficit or paresthesia (Age 29-64y) EXAM: CT HEAD WITHOUT CONTRAST CT CERVICAL SPINE WITHOUT CONTRAST TECHNIQUE: Multidetector CT imaging of the head and cervical spine was performed following the standard protocol without intravenous contrast. Multiplanar CT image reconstructions of the cervical spine were also generated. RADIATION DOSE REDUCTION: This exam was performed according to the departmental dose-optimization program which includes automated exposure control, adjustment of the mA and/or kV according to patient size and/or use of iterative reconstruction technique. COMPARISON:  None Available. FINDINGS: CT HEAD FINDINGS Brain: No hemorrhage. No  hydrocephalus. No extra-axial fluid collection. No mass effect. No mass lesion. 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 Vascular: No hyperdense vessel or unexpected calcification. Skull: Soft tissue swelling along the left frontal scalp. No evidence of an underlying calvarial fracture. Sinuses/Orbits: No middle ear or mastoid effusion. Paranasal sinuses are clear. Orbits are unremarkable Other: None. CT CERVICAL SPINE FINDINGS Alignment: Normal. Skull base and vertebrae: No acute fracture. No primary bone lesion or focal pathologic process. Soft tissues and spinal canal: No prevertebral fluid or swelling. No visible canal hematoma. Disc levels:  CT evidence of high-grade spinal canal stenosis Upper chest: Negative. Other: Partially imaged endotracheal and enteric tubes in place. IMPRESSION: 1. No CT evidence of intracranial injury. 2. 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 If there is clinical concern for an acute infarct, consider further evaluation with MRI. 3. Soft tissue swelling along the left frontal scalp. No evidence of an underlying calvarial fracture. 4. No acute fracture or traumatic subluxation of the cervical spine. Electronically Signed   By: Lorenza Cambridge M.D.   On: 01/26/2024 10:46   DG Chest Port 1  View Result Date: 01/26/2024 CLINICAL DATA:  Sepsis. EXAM: PORTABLE CHEST 1 VIEW COMPARISON:  Chest radiograph dated 07/20/2021. FINDINGS: Shallow inspiration with minimal bibasilar atelectasis. No focal consolidation, pleural effusion, or pneumothorax. Mild cardiomegaly with mild central vascular congestion. No acute osseous pathology. IMPRESSION: Mild cardiomegaly with mild central vascular congestion. No focal consolidation. Electronically Signed   By: Elgie Collard M.D.   On: 01/26/2024 10:14    ECHO as above  TELEMETRY reviewed by me 01/29/2024: sinus rhythm rate 80s  EKG reviewed by me: Sinus rhythm rate 82 bpm  Data reviewed by me 01/29/2024: last 24h vitals tele  labs imaging I/O PCCM notes  Principal Problem:   Septic shock (HCC)    ASSESSMENT AND PLAN:  Abigail Molina is a 58 y.o. female  with a past medical history of hypertension, hyperlipidemia, type 2 diabetes, anemia, endometrial cancer, OSA, GERD, NASH who presented to the ED on 01/26/2024 for unresponsiveness, found down by family and EMS brought her to the ED.  Troponins elevated.  Cardiology was consulted for further evaluation.   # NSTEMI, type I vs type II # Septic shock # Acute metabolic encephalopathy # Anemia Patient brought to the ED from home after being found unresponsive and hypotensive. Intubated in the ED. Placed on pressors and treated for septic shock, found to be bacteremic. Extubated today 01/29/24. Troponins trended 179 > 591 > 886 > 1349 > 2680 > 4087 > 3717. Echo this admission with preserved EF. Patient with intermittent CP for 6 months, none at present.  -Hold further heparin given anemia.  S/p blood transfusion yesterday, Hgb stable this AM. Would recommend GI consult prior to [place] proceeding with heart catheterization as if we proceed with cath and she needs intervention she would require heparin during procedure and DAPT afterwards. -Given bacteremia, heart cath initially deferred. See further recommendations above. -Continue aspirin 81 mg daily.  Continue Crestor 20 mg daily. -Vasopressin discontinued yesterday, remains on Levo but this is being weaned.  Once stable off of pressors can consider resuming home BP meds. -Further management of septic shock as per PCCM.   This patient's plan of care was discussed and created with Dr. Darrold Junker and he is in agreement.  Signed: Gale Journey, PA-C  01/29/2024, 9:00 AM [facility]

## 2024-01-29 NOTE — Consult Note (Signed)
 PHARMACY CONSULT NOTE - Hypertonic Saline  Pharmacy Consult for Hypertonic Saline Monitoring and Management  Recent Labs: Potassium (mmol/L)  Date Value  01/28/2024 4.2   Magnesium (mg/dL)  Date Value  16/09/9603 2.3   Calcium (mg/dL)  Date Value  54/08/8118 8.5 (L)   Albumin (g/dL)  Date Value  14/78/2956 2.5 (L)  11/17/2018 3.9   Phosphorus (mg/dL)  Date Value  21/30/8657 2.3 (L)   Sodium (mmol/L)  Date Value  01/28/2024 126 (L)  11/17/2018 141   Assessment  Abigail Molina is a 59 y.o. female found down by family admitted with acute hyponatremia. Pharmacy has been consulted to monitor hypertonic saline (3%) infusion.   Patient was previously on hypertonic saline when first admitted for sodium of 115. Restart per nephrology due to sodium of 121 today.   Goal of Therapy:  Increase in Na by 4-6 mEq/L in 4-6 hours Do not exceed increase in Na by 10-12 mEq/L in 24 hours  Monitoring:  Date Time Na Rate/Comment  02/27   0551   121      20 mL/hr  02/27   1008   122      20 mL/hr  02/27   1127   126      20 mL/hr  02/27   1644   122      20 mL/hr  02/27   1903   124      20 mL/hr  02/27   2257   126      20 ml/hr   Plan:  Continue 3% saline at 20 mL/hr Check Na Q2H x 2 then Q4H  Stop infusion if  Na increases > 4 mEq/L in first 2 hours Na increases > 6 mEq/L in first 4 hours Na increases > 6 mEq/L in 6 hours  Na increases > 8 mEq/L in 8 hours (give D5W bolus) Continue to monitor for signs of clinical improvement and recommendations per nephrology [id number]: Nephro aware of 4 mEq/L increase in 1.5 hours and okay with rate  Devaney Segers D, PharmD 01/29/2024 12:19 AM

## 2024-01-29 NOTE — Progress Notes (Signed)
 PHARMACY CONSULT NOTE - FOLLOW UP  Pharmacy Consult for Electrolyte Monitoring and Replacement   Recent Labs: Potassium (mmol/L)  Date Value  01/29/2024 3.6   Magnesium (mg/dL)  Date Value  16/09/9603 2.6 (H)   Calcium (mg/dL)  Date Value  54/08/8118 8.8 (L)   Albumin (g/dL)  Date Value  14/78/2956 2.7 (L)  11/17/2018 3.9   Phosphorus (mg/dL)  Date Value  21/30/8657 2.8   Sodium (mmol/L)  Date Value  01/29/2024 128 (L)  11/17/2018 141   Assessment: Patient is a 58 year old female with a past medical history of chronic pain syndrome, cirrhosis, and diabetes who was found unresponsive on her couch this morning. Pharmacy has been consulted to monitor and replace electrolytes as needed.  Goal of Therapy:  Electrolytes WNL Today, 01/29/2024 K = 3.6 Mg = 2.6 Phos = 2.8  Plan:  No replacement indicated at this time Continue to monitor electrolytes daily  Effie Shy, PharmD Pharmacy Resident  01/29/2024 6:19 AM

## 2024-01-29 NOTE — Progress Notes (Signed)
 [facility]  ROUNDING NOTE   Subjective:   Patient resting quietly Partially completed breakfast tray at bedside 2 L nasal cannula  Sodium 128 Creatinine 1.00 Urine output 1100 mL  Objective:  Vital signs in last 24 hours:  Temp:  [97.6 F (36.4 C)-99 F (37.2 C)] 97.9 F (36.6 C) (02/28 0822) Pulse Rate:  [64-174] 90 (02/28 1100) Resp:  [9-26] 25 (02/28 1100) BP: (83-115)/(45-74) 115/64 (02/28 1100) SpO2:  [86 %-100 %] 95 % (02/28 1100) FiO2 (%):  [28 %] 28 % (02/28 0600) Weight:  [90.6 kg] 90.6 kg (02/28 0431)  Weight change: 0.3 kg Filed Weights   01/27/24 0400 01/28/24 0400 01/29/24 0431  Weight: 89.9 kg 90.3 kg 90.6 kg    Intake/Output: I/O last 3 completed shifts: In: 5153.3 [P.O.:1680; I.V.:2107.8; Blood:672.5; IV Piggyback:693] Out: 1680 [Urine:1680]   Intake/Output this shift:  Total I/O In: 398.9 [P.O.:240; I.V.:61.2; IV Piggyback:97.7] Out: 375 [Urine:375]  Physical Exam: General: No acute distress  Head: Normocephalic, atraumatic. Moist oral mucosal membranes  Neck: Supple  Lungs:  Diminished, normal effort  Heart: S1S2 no rubs  Abdomen:  Soft, nontender, bowel sounds present  Extremities: Trace peripheral edema.  Neurologic: Awake, alert, following commands  Skin: No acute rash  Access: No hemodialysis access    Basic Metabolic Panel: Recent Labs  Lab 01/26/24 1153 01/26/24 1213 01/26/24 2038 01/27/24 0010 01/27/24 0414 01/28/24 0454 01/28/24 1008 01/28/24 1644 01/28/24 1903 01/28/24 2257 01/29/24 0334 01/29/24 0822  NA  --    < > 121* 122* 125* 121*   < > 122* 124* 126* 128* 128*  K  --    < > 5.9* 5.4* 4.7 4.2  --   --   --   --  3.6  --   CL  --    < > 90* 90* 93* 87*  --   --   --   --  93*  --   CO2  --    < > 19* 19* 21* 23  --   --   --   --  23  --   GLUCOSE  --    < > 347* 327* 151* 151*  --   --   --   --  228*  --   BUN  --    < > 57* 57* 54* 49*  --   --   --   --  51*  --   CREATININE  --    < > 1.78*  1.69* 1.47* 1.19*  --   --   --   --  1.00  --   CALCIUM  --    < > 8.9 8.9 8.9 8.5*  --   --   --   --  8.8*  --   MG 2.1  --   --   --  2.3 2.3  --   --   --   --  2.6*  --   PHOS 5.1*  --   --   --  3.3 2.3*  --   --   --   --  2.8  --    < > = values in this interval not displayed.    Liver Function Tests: Recent Labs  Lab 01/26/24 0834 01/28/24 0454 01/29/24 0334  AST 71*  --   --   ALT 26  --   --   ALKPHOS 204*  --   --   BILITOT 5.1*  --   --  PROT 6.4*  --   --   ALBUMIN 1.9* 2.5* 2.7*   No results for input(s): "LIPASE", "AMYLASE" in the last 168 hours. Recent Labs  Lab 01/26/24 0834 01/26/24 1213  AMMONIA 52* 56*    CBC: Recent Labs  Lab 01/26/24 0834 01/26/24 2038 01/27/24 0414 01/28/24 0454 01/28/24 1250 01/28/24 1447 01/28/24 2257 01/29/24 0334  WBC 40.9* 58.6* 50.9* 25.6*  --   --   --  9.4  NEUTROABS 36.1* 53.3* 43.5*  --   --   --   --   --   HGB 7.2* 8.0* 8.2* 6.6* 6.8* 6.8* 8.1* 8.6*  HCT 21.9* 25.2* 24.9* 20.5* 21.0* 21.0* 24.5* 26.3*  MCV 80.8 82.6 81.4 79.5*  --   --   --  80.4  PLT 185 385 416* 243  --   --   --  159    Cardiac Enzymes: No results for input(s): "CKTOTAL", "CKMB", "CKMBINDEX", "TROPONINI" in the last 168 hours.  BNP: Invalid input(s): "POCBNP"  CBG: Recent Labs  Lab 01/28/24 2211 01/29/24 0008 01/29/24 0344 01/29/24 0726 01/29/24 1121  GLUCAP 326* 302* 202* 161* 177*    Microbiology: Results for orders placed or performed during the hospital encounter of 01/26/24  Resp panel by RT-PCR (RSV, Flu A&B, Covid) Anterior Nasal Swab     Status: None   Collection Time: 01/26/24  8:34 AM   Specimen: Anterior Nasal Swab  Result Value Ref Range Status   SARS Coronavirus 2 by RT PCR NEGATIVE NEGATIVE Final    Comment: (NOTE) SARS-CoV-2 target nucleic acids are NOT DETECTED.  The SARS-CoV-2 RNA is generally detectable in upper respiratory specimens during the acute phase of infection. The lowest concentration of  SARS-CoV-2 viral copies this assay can detect is 138 copies/mL. A negative result does not preclude SARS-Cov-2 infection and should not be used as the sole basis for treatment or other patient management decisions. A negative result may occur with  improper specimen collection/handling, submission of specimen other than nasopharyngeal swab, presence of viral mutation(s) within the areas targeted by this assay, and inadequate number of viral copies(<138 copies/mL). A negative result must be combined with clinical observations, patient history, and epidemiological information. The expected result is Negative.  Fact Sheet for Patients:  BloggerCourse.com  Fact Sheet for Healthcare Providers:  SeriousBroker.it  This test is no t yet approved or cleared by the [place] FDA and  has been authorized for detection and/or diagnosis of SARS-CoV-2 by FDA under an Emergency Use Authorization (EUA). This EUA will remain  in effect (meaning this test can be used) for the duration of the COVID-19 declaration under Section 564(b)(1) of the Act, 21 U.S.C.section 360bbb-3(b)(1), unless the authorization is terminated  or revoked sooner.       Influenza A by PCR NEGATIVE NEGATIVE Final   Influenza B by PCR NEGATIVE NEGATIVE Final    Comment: (NOTE) The Xpert Xpress SARS-CoV-2/FLU/RSV plus assay is intended as an aid in the diagnosis of influenza from Nasopharyngeal swab specimens and should not be used as a sole basis for treatment. Nasal washings and aspirates are unacceptable for Xpert Xpress SARS-CoV-2/FLU/RSV testing.  Fact Sheet for Patients: BloggerCourse.com  Fact Sheet for Healthcare Providers: SeriousBroker.it  This test is not yet approved or cleared by the [place] FDA and has been authorized for detection and/or diagnosis of SARS-CoV-2 by FDA under an Emergency Use  Authorization (EUA). This EUA will remain in effect (meaning this test can be used) for the duration  of the COVID-19 declaration under Section 564(b)(1) of the Act, 21 U.S.C. section 360bbb-3(b)(1), unless the authorization is terminated or revoked.     Resp Syncytial Virus by PCR NEGATIVE NEGATIVE Final    Comment: (NOTE) Fact Sheet for Patients: BloggerCourse.com  Fact Sheet for Healthcare Providers: SeriousBroker.it  This test is not yet approved or cleared by the [place] FDA and has been authorized for detection and/or diagnosis of SARS-CoV-2 by FDA under an Emergency Use Authorization (EUA). This EUA will remain in effect (meaning this test can be used) for the duration of the COVID-19 declaration under Section 564(b)(1) of the Act, 21 U.S.C. section 360bbb-3(b)(1), unless the authorization is terminated or revoked.  Performed at [facility], [address] [place], [place] [address]   Blood Culture (routine x 2)     Status: Abnormal   Collection Time: 01/26/24  8:34 AM   Specimen: BLOOD  Result Value Ref Range Status   Specimen Description   Final    BLOOD RIGHT AC Performed at [facility], [address] [place], [place] [address]    Special Requests   Final    BOTTLES DRAWN AEROBIC AND ANAEROBIC Blood Culture results may not be optimal due to an inadequate volume of blood received in culture bottles Performed at [facility], [address], [place], [place] [address]    Culture  Setup Time   Final    GRAM NEGATIVE RODS IN BOTH AEROBIC AND ANAEROBIC BOTTLES CRITICAL RESULT CALLED TO, READ BACK BY AND VERIFIED WITH: JASON ROBBINS PHARMD @0240  01/27/24 ASW    Culture KLEBSIELLA OXYTOCA (A)  Final   Report Status 01/29/2024 FINAL  Final   Organism ID, Bacteria KLEBSIELLA OXYTOCA  Final      Susceptibility   Klebsiella oxytoca - MIC*    AMPICILLIN >=32 RESISTANT Resistant      CEFEPIME <=0.12 SENSITIVE Sensitive     CEFTAZIDIME <=1 SENSITIVE Sensitive     CEFTRIAXONE <=0.25 SENSITIVE Sensitive     CIPROFLOXACIN <=0.25 SENSITIVE Sensitive     GENTAMICIN <=1 SENSITIVE Sensitive     IMIPENEM <=0.25 SENSITIVE Sensitive     TRIMETH/SULFA <=20 SENSITIVE Sensitive     AMPICILLIN/SULBACTAM 8 SENSITIVE Sensitive     PIP/TAZO <=4 SENSITIVE Sensitive ug/mL    * KLEBSIELLA OXYTOCA  Blood Culture (routine x 2)     Status: Abnormal (Preliminary result)   Collection Time: 01/26/24  8:34 AM   Specimen: BLOOD  Result Value Ref Range Status   Specimen Description   Final    BLOOD LEFT ANTECUBITAL Performed at [facility], [address] [place], [place] [address]    Special Requests   Final    BOTTLES DRAWN AEROBIC AND ANAEROBIC Blood Culture results may not be optimal due to an inadequate volume of blood received in culture bottles Performed at [facility], [address] [place], [place] [address]    Culture  Setup Time   Final    GRAM POSITIVE COCCI IN BOTH AEROBIC AND ANAEROBIC BOTTLES CRITICAL RESULT CALLED TO, READ BACK BY AND VERIFIED WITH: EMILY STEINBOCH 01/27/24 1439 KLW    Culture (A)  Final    STAPHYLOCOCCUS EPIDERMIDIS THE SIGNIFICANCE OF ISOLATING THIS ORGANISM FROM A SINGLE SET OF BLOOD CULTURES WHEN MULTIPLE SETS ARE DRAWN IS UNCERTAIN. PLEASE NOTIFY THE MICROBIOLOGY DEPARTMENT WITHIN ONE WEEK IF SPECIATION AND SENSITIVITIES ARE REQUIRED. Performed at [facility], [address], [place], [place] [address]    Report Status  PENDING  Incomplete  Urine Culture     Status: Abnormal   Collection Time: 01/26/24  8:34 AM   Specimen: Urine, Clean Catch  Result Value Ref Range Status   Specimen Description   Final    URINE, CLEAN CATCH Performed at [facility], [address], [place], [place] [address]    Special Requests   Final    NONE Reflexed from [id number] Performed at [facility], [address]  [place], [place] [address]    Culture >=100,000 COLONIES/mL KLEBSIELLA OXYTOCA (A)  Final   Report Status 01/28/2024 FINAL  Final   Organism ID, Bacteria KLEBSIELLA OXYTOCA (A)  Final      Susceptibility   Klebsiella oxytoca - MIC*    AMPICILLIN >=32 RESISTANT Resistant     CEFEPIME <=0.12 SENSITIVE Sensitive     CEFTRIAXONE <=0.25 SENSITIVE Sensitive     CIPROFLOXACIN <=0.25 SENSITIVE Sensitive     GENTAMICIN <=1 SENSITIVE Sensitive     IMIPENEM <=0.25 SENSITIVE Sensitive     NITROFURANTOIN <=16 SENSITIVE Sensitive     TRIMETH/SULFA <=20 SENSITIVE Sensitive     AMPICILLIN/SULBACTAM 8 SENSITIVE Sensitive     PIP/TAZO <=4 SENSITIVE Sensitive ug/mL    * >=100,000 COLONIES/mL KLEBSIELLA OXYTOCA  Blood Culture ID Panel (Reflexed)     Status: Abnormal   Collection Time: 01/26/24  8:34 AM  Result Value Ref Range Status   Enterococcus faecalis NOT DETECTED NOT DETECTED Final   Enterococcus Faecium NOT DETECTED NOT DETECTED Final   Listeria monocytogenes NOT DETECTED NOT DETECTED Final   Staphylococcus species NOT DETECTED NOT DETECTED Final   Staphylococcus aureus (BCID) NOT DETECTED NOT DETECTED Final   Staphylococcus epidermidis NOT DETECTED NOT DETECTED Final   Staphylococcus lugdunensis NOT DETECTED NOT DETECTED Final   Streptococcus species NOT DETECTED NOT DETECTED Final   Streptococcus agalactiae NOT DETECTED NOT DETECTED Final   Streptococcus pneumoniae NOT DETECTED NOT DETECTED Final   Streptococcus pyogenes NOT DETECTED NOT DETECTED Final   A.calcoaceticus-baumannii NOT DETECTED NOT DETECTED Final   Bacteroides fragilis NOT DETECTED NOT DETECTED Final   Enterobacterales DETECTED (A) NOT DETECTED Final    Comment: Enterobacterales represent a large order of gram negative bacteria, not a single organism. CRITICAL RESULT CALLED TO, READ BACK BY AND VERIFIED WITH: JASON ROBBINS PHARMD @0240  01/27/24 ASW    Enterobacter cloacae complex NOT DETECTED NOT DETECTED Final   Escherichia coli  NOT DETECTED NOT DETECTED Final   Klebsiella aerogenes NOT DETECTED NOT DETECTED Final   Klebsiella oxytoca DETECTED (A) NOT DETECTED Final    Comment: CRITICAL RESULT CALLED TO, READ BACK BY AND VERIFIED WITH: JASON ROBBINS PHARMD @0240  01/27/24 ASW    Klebsiella pneumoniae NOT DETECTED NOT DETECTED Final   Proteus species NOT DETECTED NOT DETECTED Final   Salmonella species NOT DETECTED NOT DETECTED Final   Serratia marcescens NOT DETECTED NOT DETECTED Final   Haemophilus influenzae NOT DETECTED NOT DETECTED Final   Neisseria meningitidis NOT DETECTED NOT DETECTED Final   Pseudomonas aeruginosa NOT DETECTED NOT DETECTED Final   Stenotrophomonas maltophilia NOT DETECTED NOT DETECTED Final   Candida albicans NOT DETECTED NOT DETECTED Final   Candida auris NOT DETECTED NOT DETECTED Final   Candida glabrata NOT DETECTED NOT DETECTED Final   Candida krusei NOT DETECTED NOT DETECTED Final   Candida parapsilosis NOT DETECTED NOT DETECTED Final   Candida tropicalis NOT DETECTED NOT DETECTED Final   Cryptococcus neoformans/gattii NOT DETECTED NOT DETECTED Final   CTX-M ESBL NOT DETECTED  NOT DETECTED Final   Carbapenem resistance IMP NOT DETECTED NOT DETECTED Final   Carbapenem resistance KPC NOT DETECTED NOT DETECTED Final   Carbapenem resistance NDM NOT DETECTED NOT DETECTED Final   Carbapenem resist OXA 48 LIKE NOT DETECTED NOT DETECTED Final   Carbapenem resistance VIM NOT DETECTED NOT DETECTED Final    Comment: Performed at [facility], [address] [place], [place] [address]  Blood Culture ID Panel (Reflexed)     Status: Abnormal   Collection Time: 01/26/24  8:34 AM  Result Value Ref Range Status   Enterococcus faecalis NOT DETECTED NOT DETECTED Final   Enterococcus Faecium NOT DETECTED NOT DETECTED Final   Listeria monocytogenes NOT DETECTED NOT DETECTED Final   Staphylococcus species DETECTED (A) NOT DETECTED Final    Comment: CRITICAL RESULT CALLED TO, READ BACK BY  AND VERIFIED WITH: EMILY STEINBOCH 01/27/24 1439 KLW    Staphylococcus aureus (BCID) NOT DETECTED NOT DETECTED Final   Staphylococcus epidermidis DETECTED (A) NOT DETECTED Final    Comment: Methicillin (oxacillin) resistant coagulase negative staphylococcus. Possible blood culture contaminant (unless isolated from more than one blood culture draw or clinical case suggests pathogenicity). No antibiotic treatment is indicated for blood  culture contaminants. CRITICAL RESULT CALLED TO, READ BACK BY AND VERIFIED WITH: EMILY STEINBOCH 01/27/24 1439 KLW    Staphylococcus lugdunensis NOT DETECTED NOT DETECTED Final   Streptococcus species NOT DETECTED NOT DETECTED Final   Streptococcus agalactiae NOT DETECTED NOT DETECTED Final   Streptococcus pneumoniae NOT DETECTED NOT DETECTED Final   Streptococcus pyogenes NOT DETECTED NOT DETECTED Final   A.calcoaceticus-baumannii NOT DETECTED NOT DETECTED Final   Bacteroides fragilis NOT DETECTED NOT DETECTED Final   Enterobacterales NOT DETECTED NOT DETECTED Final   Enterobacter cloacae complex NOT DETECTED NOT DETECTED Final   Escherichia coli NOT DETECTED NOT DETECTED Final   Klebsiella aerogenes NOT DETECTED NOT DETECTED Final   Klebsiella oxytoca NOT DETECTED NOT DETECTED Final   Klebsiella pneumoniae NOT DETECTED NOT DETECTED Final   Proteus species NOT DETECTED NOT DETECTED Final   Salmonella species NOT DETECTED NOT DETECTED Final   Serratia marcescens NOT DETECTED NOT DETECTED Final   Haemophilus influenzae NOT DETECTED NOT DETECTED Final   Neisseria meningitidis NOT DETECTED NOT DETECTED Final   Pseudomonas aeruginosa NOT DETECTED NOT DETECTED Final   Stenotrophomonas maltophilia NOT DETECTED NOT DETECTED Final   Candida albicans NOT DETECTED NOT DETECTED Final   Candida auris NOT DETECTED NOT DETECTED Final   Candida glabrata NOT DETECTED NOT DETECTED Final   Candida krusei NOT DETECTED NOT DETECTED Final   Candida parapsilosis NOT DETECTED  NOT DETECTED Final   Candida tropicalis NOT DETECTED NOT DETECTED Final   Cryptococcus neoformans/gattii NOT DETECTED NOT DETECTED Final   Methicillin resistance mecA/C DETECTED (A) NOT DETECTED Final    Comment: CRITICAL RESULT CALLED TO, READ BACK BY AND VERIFIED WITH: EMILY STEINBOCH 01/27/24 1439 KLW Performed at [facility], [address] [place], [place] [address]   MRSA Next Gen by PCR, Nasal     Status: Abnormal   Collection Time: 01/26/24 11:42 AM   Specimen: Nasal Mucosa; Nasal Swab  Result Value Ref Range Status   MRSA by PCR Next Gen DETECTED (A) NOT DETECTED Final    Comment: RESULT CALLED TO, READ BACK BY AND VERIFIED WITH: Claudette Stapler 01/26/24 1315 MW (NOTE) The GeneXpert MRSA Assay (FDA approved for NASAL specimens only), is one component of a comprehensive MRSA colonization surveillance program. It is not intended  to diagnose MRSA infection nor to guide or monitor treatment for MRSA infections. Test performance is not FDA approved in patients less than 93 years old. Performed at [facility], [address] [place], [place] [address]   Culture, Respiratory w Gram Stain     Status: None   Collection Time: 01/26/24  2:06 PM   Specimen: Tracheal Aspirate; Respiratory  Result Value Ref Range Status   Specimen Description TRACHEAL ASPIRATE  Final   Special Requests NONE  Final   Gram Stain   Final    RARE WBC PRESENT, PREDOMINANTLY PMN NO ORGANISMS SEEN    Culture   Final    RARE Normal respiratory flora-no Staph aureus or Pseudomonas seen Performed at [facility], [address], [place], [place] [address]    Report Status 01/29/2024 FINAL  Final  Respiratory (~20 pathogens) panel by PCR     Status: None   Collection Time: 01/26/24  7:00 PM   Specimen: Nasopharyngeal Swab; Respiratory  Result Value Ref Range Status   Adenovirus NOT DETECTED NOT DETECTED Final   Coronavirus 229E NOT DETECTED NOT DETECTED Final    Comment:  (NOTE) The Coronavirus on the Respiratory Panel, DOES NOT test for the novel  Coronavirus (2019 nCoV)    Coronavirus HKU1 NOT DETECTED NOT DETECTED Final   Coronavirus NL63 NOT DETECTED NOT DETECTED Final   Coronavirus OC43 NOT DETECTED NOT DETECTED Final   Metapneumovirus NOT DETECTED NOT DETECTED Final   Rhinovirus / Enterovirus NOT DETECTED NOT DETECTED Final   Influenza A NOT DETECTED NOT DETECTED Final   Influenza B NOT DETECTED NOT DETECTED Final   Parainfluenza Virus 1 NOT DETECTED NOT DETECTED Final   Parainfluenza Virus 2 NOT DETECTED NOT DETECTED Final   Parainfluenza Virus 3 NOT DETECTED NOT DETECTED Final   Parainfluenza Virus 4 NOT DETECTED NOT DETECTED Final   Respiratory Syncytial Virus NOT DETECTED NOT DETECTED Final   Bordetella pertussis NOT DETECTED NOT DETECTED Final   Bordetella Parapertussis NOT DETECTED NOT DETECTED Final   Chlamydophila pneumoniae NOT DETECTED NOT DETECTED Final   Mycoplasma pneumoniae NOT DETECTED NOT DETECTED Final    Comment: Performed at [facility], [address] [place], [place] [address]    Coagulation Studies: No results for input(s): "LABPROT", "INR" in the last 72 hours.   Urinalysis: No results for input(s): "COLORURINE", "LABSPEC", "PHURINE", "GLUCOSEU", "HGBUR", "BILIRUBINUR", "KETONESUR", "PROTEINUR", "UROBILINOGEN", "NITRITE", "LEUKOCYTESUR" in the last 72 hours.  Invalid input(s): "APPERANCEUR"     Imaging: No results found.    Medications:    cefTRIAXone (ROCEPHIN)  IV 200 mL/hr at 01/29/24 1000   norepinephrine (LEVOPHED) Adult infusion Stopped (01/29/24 0913)    vitamin C  500 mg Oral BID   aspirin  81 mg Oral Daily   [START ON 01/30/2024] buPROPion  150 mg Oral Daily   Chlorhexidine Gluconate Cloth  6 each Topical Daily   [START ON 01/30/2024] citalopram  20 mg Oral Daily   feeding supplement (GLUCERNA SHAKE)  237 mL Oral TID BM   Gerhardt's butt cream   Topical BID   hydrocortisone sod succinate  (SOLU-CORTEF) inj  50 mg Intravenous Q12H   insulin aspart  0-20 Units Subcutaneous Q4H   insulin aspart  5 Units Subcutaneous TID WC   insulin glargine  26 Units Subcutaneous QHS   lactulose  20 g Oral BID   levETIRAcetam  750 mg Oral BID   midodrine  10 mg Oral TID WC   mirtazapine  15 mg Oral QHS   multivitamin with minerals  1 tablet Oral Daily   mupirocin ointment  1 Application Nasal BID   pantoprazole (PROTONIX) IV  40 mg Intravenous Q12H   rifaximin  550 mg Oral BID   rosuvastatin  20 mg Oral Daily   sodium chloride flush  10-40 mL Intracatheter Q12H   thiamine  100 mg Oral Daily   zinc sulfate (50mg  elemental zinc)  220 mg Oral Daily   acetaminophen, docusate sodium, fentaNYL (SUBLIMAZE) injection, mouth rinse, polyethylene glycol, polyvinyl alcohol, sodium chloride flush  Assessment/ Plan:  58 y.o. female with past medical history of anemia, depression, diabetes mellitus type 2, liver cirrhosis, GERD, hypertension, obstructive sleep apnea who was admitted on 01/26/2024 for acute hyponatremia, ventricular tachycardia, septic shock, altered mental status.  1.  Severe hyponatremia.  Serum sodium was 115 upon arrival.  Urine sodium was less than 10.  Patient was initially started on 3% saline and stopped within a short period of time.    Sodium has improved to 128.  Will stop 3% hypertonic saline.  Patient encouraged to maintain oral intake.  2.  Acute kidney injury/hyperkalemia.  Acute kidney injury likely secondary to hypotension with development of ATN.  CT abdomen pelvis negative for obstruction.  Renal function has returned to baseline..  3.  Acute metabolic acidosis.  Serum bicarbonate 23.  Corrected.     LOS: 3 Amaury Kuzel 2/28/202512:19 PM

## 2024-01-29 NOTE — Consult Note (Signed)
 PHARMACY CONSULT NOTE - Hypertonic Saline  Pharmacy Consult for Hypertonic Saline Monitoring and Management  Recent Labs: Potassium (mmol/L)  Date Value  01/29/2024 3.6   Magnesium (mg/dL)  Date Value  62/13/0865 2.6 (H)   Calcium (mg/dL)  Date Value  78/46/9629 8.8 (L)   Albumin (g/dL)  Date Value  52/84/1324 2.7 (L)  11/17/2018 3.9   Phosphorus (mg/dL)  Date Value  40/08/2724 2.8   Sodium (mmol/L)  Date Value  01/29/2024 128 (L)  11/17/2018 141   Assessment  Abigail Molina is a 58 y.o. female found down by family admitted with acute hyponatremia. Pharmacy has been consulted to monitor hypertonic saline (3%) infusion.   Patient was previously on hypertonic saline when first admitted for sodium of 115. Restart per nephrology due to sodium of 121 today.   Goal of Therapy:  Increase in Na by 4-6 mEq/L in 4-6 hours Do not exceed increase in Na by 10-12 mEq/L in 24 hours  Monitoring:  Date Time Na Rate/Comment  02/27   0551   121      20 mL/hr  02/27   1008   122      20 mL/hr  02/27   1127   126      20 mL/hr  02/27   1644   122      20 mL/hr  02/27   1903   124      20 mL/hr  02/28 0822 128 20 mL/hr  Plan:  Continue 3% saline at 20 mL/hr Check Na Q2H x 2 then Q4H  Stop infusion if  Na increases > 4 mEq/L in first 2 hours Na increases > 6 mEq/L in first 4 hours Na increases > 6 mEq/L in 6 hours  Na increases > 8 mEq/L in 8 hours (give D5W bolus) Continue to monitor for signs of clinical improvement and recommendations per nephrology 0227 1230: Nephro aware of 4 mEq/L increase in 1.5 hours and okay with rate  Effie Shy, PharmD Pharmacy Resident  01/29/2024 11:04 AM

## 2024-01-29 NOTE — Inpatient Diabetes Management (Signed)
 Inpatient Diabetes Program Recommendations  AACE/ADA: New Consensus Statement on Inpatient Glycemic Control (2015)  Target Ranges:  Prepandial:   less than 140 mg/dL      Peak postprandial:   less than 180 mg/dL (1-2 hours)      Critically ill patients:  140 - 180 mg/dL   Lab Results  Component Value Date   GLUCAP 161 (H) 01/29/2024   HGBA1C 8.5 (H) 01/26/2024    Review of Glycemic Control  Latest Reference Range & Units 01/28/24 15:47 01/28/24 19:49 01/28/24 22:11 01/29/24 00:08 01/29/24 03:44 01/29/24 07:26  Glucose-Capillary 70 - 99 mg/dL 914 (H) 782 (H) 956 (H) 302 (H) 202 (H) 161 (H)  (H): Data is abnormally high Diabetes history: Type 2 DM Outpatient Diabetes medications: Invokana 300 mg every day, Lantus 65 units every day, Metformin 1000 mg BID Current orders for Inpatient glycemic control: Lantus 26 units every day, Novolog 5 units TID, Novolog 0-20 units Q4H Solucortef 50 mg Q6H  Inpatient Diabetes Program Recommendations:    Consider: -Increasing Lantus 25 units BID   Thanks, Lujean Rave, MSN, RNC-OB Diabetes Coordinator [phone] (8a-5p)

## 2024-01-29 NOTE — Plan of Care (Signed)
  Problem: Education: Goal: Knowledge of General Education information will improve Description: Including pain rating scale, medication(s)/side effects and non-pharmacologic comfort measures Outcome: Progressing   Problem: Clinical Measurements: Goal: Ability to maintain clinical measurements within normal limits will improve Outcome: Progressing   Problem: Activity: Goal: Risk for activity intolerance will decrease Outcome: Progressing   Problem: Nutrition: Goal: Adequate nutrition will be maintained Outcome: Progressing   Problem: Coping: Goal: Level of anxiety will decrease Outcome: Progressing   Problem: Elimination: Goal: Will not experience complications related to bowel motility Outcome: Progressing   Problem: Pain Managment: Goal: General experience of comfort will improve and/or be controlled Outcome: Progressing   Problem: Safety: Goal: Ability to remain free from injury will improve Outcome: Progressing

## 2024-01-29 NOTE — Consult Note (Signed)
 PHARMACY CONSULT NOTE - Hypertonic Saline  Pharmacy Consult for Hypertonic Saline Monitoring and Management  Recent Labs: Potassium (mmol/L)  Date Value  01/29/2024 3.6   Magnesium (mg/dL)  Date Value  60/45/4098 2.6 (H)   Calcium (mg/dL)  Date Value  11/91/4782 8.8 (L)   Albumin (g/dL)  Date Value  95/62/1308 2.7 (L)  11/17/2018 3.9   Phosphorus (mg/dL)  Date Value  65/78/4696 2.8   Sodium (mmol/L)  Date Value  01/29/2024 128 (L)  11/17/2018 141   Assessment  Abigail Molina is a 58 y.o. female found down by family admitted with acute hyponatremia. Pharmacy has been consulted to monitor hypertonic saline (3%) infusion.   Patient was previously on hypertonic saline when first admitted for sodium of 115. Restart per nephrology due to sodium of 121 today.   Goal of Therapy:  Increase in Na by 4-6 mEq/L in 4-6 hours Do not exceed increase in Na by 10-12 mEq/L in 24 hours  Monitoring:  Date Time Na Rate/Comment  02/27   0551   121      20 mL/hr  02/27   1008   122      20 mL/hr  02/27   1127   126      20 mL/hr  02/27   1644   122      20 mL/hr  02/27   1903   124      20 mL/hr  02/27   2257   126      20 ml/hr  02/28   0334   128      20 ml/hr   Plan:  Continue 3% saline at 20 mL/hr Check Na Q2H x 2 then Q4H  Stop infusion if  Na increases > 4 mEq/L in first 2 hours Na increases > 6 mEq/L in first 4 hours Na increases > 6 mEq/L in 6 hours  Na increases > 8 mEq/L in 8 hours (give D5W bolus) Continue to monitor for signs of clinical improvement and recommendations per nephrology 0227 1230: Nephro aware of 4 mEq/L increase in 1.5 hours and okay with rate  Chivas Notz D, PharmD 01/29/2024 4:42 AM

## 2024-01-29 NOTE — Progress Notes (Signed)
 NAME:  Abigail Molina, MRN:  [id number], DOB:  25-Nov-1966, LOS: 3 ADMISSION DATE:  01/26/2024, CONSULTATION DATE: 01/26/2024 REFERRING MD: Dr. Roxan Hockey , CHIEF COMPLAINT: Unresponsiveness    Brief Pt Description / Synopsis:  58 y.o. female admitted with Acute Metabolic Encephalopathy and Severe Sepsis with Septic Shock in the setting of Klebsiella Oxytoca Bacteremia due to UTI and suspected pneumonia, along with NSTEMI, Acute Kidney Injury and Hyponatremia. Required intubation and mechanical ventilation, now extubated.  History of Present Illness:  This is a 58 yo female who presented to [facility] ER on 02/25 via EMS from home after being found unresponsive at her home by family.  It was reported her last known well time was 2100 on 02/24.  When EMS arrived on the scene pt found to be hypotensive bp 56/29 and febrile axillary temp 100.2 F.    ED Course  In the ER pt remained hypotensive and unresponsive requiring mechanical intubation.  She met sepsis protocol and received: 3L NS bolus/azithromycin/ceftriaxone.  She remained hypotensive requiring levophed gtt.  Significant lab results were: Na+ 115/K+ 6.2/chloride 86/CO2 17/glucose 126/BUN 67/creatinine 2.41/calcium 8.6/alk phos 204/albumin 1.9/AST 71/lactic acid 6.2/wbc 40.9/hgb 7.2/PT 18.8/INR 1.6.  UA results: small leukocytes/glucose >= 500/rare bacteria.  CT Head/Cervical Spine negative for acute abnormality.  CT Abd/Pelvis negative for acute abdominopelvic process, but revealed cirrhosis with portal hypertension and splenomegaly; trace left pleural effusion; and early pneumonia vs. atelectasis.  Due to severe hyponatremia pt received a 100 ml hypertonic saline bolus.  PCCM team contacted for ICU admission.    01/29/24- patient continues to improve she is conversive and reports feeling better. She is being optimized today for TRH.  Was able to wean off vasopressors  Pertinent  Medical History  Anemia  Anxiety  Arthritis  Endometrial Cancer  Chronic  Pain  Cirrhosis of Liver  Depression  Type II Diabetes Mellitus  NASH  GERD Glaucoma  HLD HTN  Meningioma OSA  Overactive Bladder  Psoriasis   Micro Data:  02/25: MRSA PCR>>positive  02/25: Covid & Resp panel by RT-PCR>>negative  02/25: Blood x2>> Klebsiella oxytoca 02/25: Urine>> 02/25: Tracheal aspirate>>  Antimicrobials:   Anti-infectives (From admission, onward)    Start     Dose/Rate Route Frequency Ordered Stop   01/27/24 2200  rifaximin (XIFAXAN) tablet 550 mg        550 mg Oral 2 times daily 01/27/24 1458     01/27/24 1000  cefTRIAXone (ROCEPHIN) 2 g in sodium chloride 0.9 % 100 mL IVPB        2 g 200 mL/hr over 30 Minutes Intravenous Every 24 hours 01/26/24 1316     01/27/24 1000  doxycycline (VIBRAMYCIN) 100 mg in sodium chloride 0.9 % 250 mL IVPB  Status:  Discontinued        100 mg 125 mL/hr over 120 Minutes Intravenous Every 12 hours 01/26/24 1341 01/27/24 0910   01/27/24 0845  azithromycin (ZITHROMAX) 500 mg in sodium chloride 0.9 % 250 mL IVPB  Status:  Discontinued        500 mg 250 mL/hr over 60 Minutes Intravenous Every 24 hours 01/26/24 1316 01/26/24 1341   01/26/24 1615  vancomycin (VANCOREADY) IVPB 1750 mg/350 mL        1,750 mg 175 mL/hr over 120 Minutes Intravenous  Once 01/26/24 1523 01/26/24 1928   01/26/24 1610  vancomycin variable dose per unstable renal function (pharmacist dosing)  Status:  Discontinued         Does not apply See  admin instructions 01/26/24 1610 01/27/24 0910   01/26/24 1430  rifaximin (XIFAXAN) tablet 550 mg  Status:  Discontinued        550 mg Per Tube 2 times daily 01/26/24 1339 01/27/24 1458   01/26/24 0845  cefTRIAXone (ROCEPHIN) 2 g in sodium chloride 0.9 % 100 mL IVPB        2 g 200 mL/hr over 30 Minutes Intravenous Once 01/26/24 0830 01/26/24 0906   01/26/24 0845  azithromycin (ZITHROMAX) 500 mg in sodium chloride 0.9 % 250 mL IVPB        500 mg 250 mL/hr over 60 Minutes Intravenous  Once 01/26/24 0830 01/26/24  1012       Significant Hospital Events: Including procedures, antibiotic start and stop dates in addition to other pertinent events   02/25: Admitted with acute toxic metabolic encephalopathy, acute kidney injury, hyperkalemia, anion gap metabolic acidosis, hypotension, intermittent wide complex tachycardia, and intermittent bradycardia requiring mechanical intubation and levophed gtt.  Nephrology consulted. 02/26: Extubated.  Remains on Levophed and Vasopressin, check CVP to assess need for further fluid requirements. Start stress dose steroids.  Blood cultures + Klebsiella Oxytoca, deescalate ABX to Ceftriaxone, consult ID. AKI and metabolic acidosis slowly improving.  Consult Cardiology due to concern for NSTEMI (HS Troponin peaked at 4087), restarting Heparin gtt.   Objective   Blood pressure 99/61, pulse 78, temperature 99 F (37.2 C), temperature source Oral, resp. rate 13, height 5\' 4"  (1.626 m), weight 90.6 kg, last menstrual period 10/15/2016, SpO2 99%. CVP:  [11 mmHg-20 mmHg] 16 mmHg  FiO2 (%):  [28 %] 28 %   Intake/Output Summary (Last 24 hours) at 01/29/2024 3244 Last data filed at 01/29/2024 0600 Gross per 24 hour  Intake 2437.34 ml  Output 1100 ml  Net 1337.34 ml   Filed Weights   01/27/24 0400 01/28/24 0400 01/29/24 0431  Weight: 89.9 kg 90.3 kg 90.6 kg    Examination: General: Acutely on chronically-ill appearing female, just extubated, in NAD  HENT: Atraumatic, normocephalic, neck Supple, no JVD  Lungs: Clear diminished throughout, even, non labored  Cardiovascular: Regular rate and rhythm, s1s2, no m/r/g, 2+ radial/1+ distal pulses, no edema   Abdomen: +BS x4, obese mildly distended Extremities: Normal bulk and tone, no deformities Skin: Stage II sacral spine pressure ulcer with erythema with abrasions present on buttocks present on admission   Neuro: Awake and alert, oriented x3, moves all extremities to commands, no focal deficits noted, speech clear,  PERRL GU: Indwelling foley catheter draining yellow urine   Assessment & Plan:   #Acute toxic metabolic encephalopathy  #Mechanical intubation pain/discomfort ~ NOW EXTUBATED Hx: Meningioma and seizures  -CT Head negative for acute intracranial abnormality, age indeterminate left cerebellar infarct -EEG suggestive of severe diffuse encephalopathy, no seizures -Treatment of metabolic derangements as outlined above -Provide supportive care -Promote normal sleep/wake cycle and family presence -Avoid sedating medications as able -Continue outpatient Keppra  #Septic shock -present on admission- due to Klebsiella bacteremia and UTI #NSTEMI #Intermittent wide complex tachycardia  #Intermittent vtach #Intermittent bradycardia  Hx: HTN and HLD  Echocardiogram 01/26/24: LVEF >55%, normal diastolic parameters, RV systolic function is normal, RV size normal -Continuous cardiac monitoring -Maintain MAP >65 -IV fluids ~ check CVP to assess need for further boluses ~ CVP is 8, hold off on further bolus -Vasopressors as needed to maintain MAP goal  -Start Stress Dose Steroids -Trend lactic acid until normalized -HS Troponin peaked at 4087 -Consult Cardiology, appreciate input ~ Recommendations: initially Heparin held  yesterday due to low Hgb, but has been stable today, will resume Heparin and monitor and treat medically -Continue Aspirin 81 mg daily, start Crestor 20 mg daily -Hold outpatient antihypertensives and diuretic therapy   #Severe Sepsis- PRESENT ON ADMISSION - DUE TO BACTEREMIA WITH KLEBSIELLA #Klebsiella Oxytoca BACTEREMIA #Suspected Pneumonia and UTI -Monitor fever curve -Trend WBC's & Procalcitonin -Follow cultures as above -Continue empiric Ceftriaxone pending cultures & sensitivities -Will consult ID to help guide further antimicrobials, appreciate input  #Acute respiratory failure  #Possible pneumonia  Hx: OSA  EXTUBATED 2/26 -Supplemental O2 as needed to maintain O2  sats >92% -BiPAP if needed -Follow intermittent Chest X-ray & ABG as needed -Bronchodilators & Pulmicort nebs -IV Steroids -ABX as above -Pulmonary toilet as able  #Severe hyponatremia ~ IMPROVING #Acute kidney injury secondary to ATN  ~ IMPROVING #Non anion gap metabolic acidosis ~ IMPROVING #Hyperkalemia ~ RESOLVED #Lactic acidosis  -Monitor I&O's / urinary output -Follow BMP -Ensure adequate renal perfusion -Avoid nephrotoxic agents as able -Replace electrolytes as indicated ~ Pharmacy following for assistance with electrolyte replacement -Continue Bicarb gtt -Nephrology following, appreciate input ~ no indication for HD currently   #NASH cirrhosis  #Portal hypertension  Hx: Esophageal varices  - Trend hepatic function panel and coags  - Avoid hepatotoxic medications as able  - Continue outpatient lactulose and rifaximin   #Anemia with no signs of obvious bleeding  - Trend CBC  - Monitor for s/sx of bleeding  - Transfuse for hgb <7  #Type II diabetes mellitus  - CBG's q4hrs  - SSI  - Target CBG's 140 to 180's - Follow hypo/hyperglycemic protocol   #Stage II pressure ulcer present on admission  - Wound care consulted appreciate input  - Turn q2hrs     Best Practice (right click and "Reselect all SmartList Selections" daily)   Diet/type: NPO until passes bedside swallow evaluation DVT prophylaxis start Heparin gtt for ACS Pressure ulcer(s): present on admission  GI prophylaxis: PPI Lines: Central line and is still needed Foley:  Yes, and it is still needed Code Status:  full code Last date of multidisciplinary goals of care discussion [2/26]   Labs   CBC: Recent Labs  Lab 01/26/24 0834 01/26/24 2038 01/27/24 0414 01/28/24 0454 01/28/24 1250 01/28/24 1447 01/28/24 2257 01/29/24 0334  WBC 40.9* 58.6* 50.9* 25.6*  --   --   --  9.4  NEUTROABS 36.1* 53.3* 43.5*  --   --   --   --   --   HGB 7.2* 8.0* 8.2* 6.6* 6.8* 6.8* 8.1* 8.6*  HCT 21.9* 25.2*  24.9* 20.5* 21.0* 21.0* 24.5* 26.3*  MCV 80.8 82.6 81.4 79.5*  --   --   --  80.4  PLT 185 385 416* 243  --   --   --  159    Basic Metabolic Panel: Recent Labs  Lab 01/26/24 1153 01/26/24 1213 01/26/24 2038 01/27/24 0010 01/27/24 0414 01/28/24 0454 01/28/24 1008 01/28/24 1127 01/28/24 1644 01/28/24 1903 01/28/24 2257 01/29/24 0334  NA  --    < > 121* 122* 125* 121*   < > 126* 122* 124* 126* 128*  K  --    < > 5.9* 5.4* 4.7 4.2  --   --   --   --   --  3.6  CL  --    < > 90* 90* 93* 87*  --   --   --   --   --  93*  CO2  --    < >  19* 19* 21* 23  --   --   --   --   --  23  GLUCOSE  --    < > 347* 327* 151* 151*  --   --   --   --   --  228*  BUN  --    < > 57* 57* 54* 49*  --   --   --   --   --  51*  CREATININE  --    < > 1.78* 1.69* 1.47* 1.19*  --   --   --   --   --  1.00  CALCIUM  --    < > 8.9 8.9 8.9 8.5*  --   --   --   --   --  8.8*  MG 2.1  --   --   --  2.3 2.3  --   --   --   --   --  2.6*  PHOS 5.1*  --   --   --  3.3 2.3*  --   --   --   --   --  2.8   < > = values in this interval not displayed.   GFR: Estimated Creatinine Clearance: 66.1 mL/min (by C-G formula based on SCr of 1 mg/dL). Recent Labs  Lab 01/26/24 1213 01/26/24 1752 01/26/24 2038 01/26/24 2039 01/27/24 0414 01/27/24 1130 01/27/24 1415 01/28/24 0454 01/28/24 1447 01/28/24 1644 01/29/24 0334  PROCALCITON 17.70  --   --   --  11.39  --   --  5.14  --   --   --   WBC  --   --  58.6*  --  50.9*  --   --  25.6*  --   --  9.4  LATICACIDVEN  --    < >  --    < > 3.5*   < > 2.4* 2.5* 2.0* 2.5*  --    < > = values in this interval not displayed.    Liver Function Tests: Recent Labs  Lab 01/26/24 0834 01/28/24 0454 01/29/24 0334  AST 71*  --   --   ALT 26  --   --   ALKPHOS 204*  --   --   BILITOT 5.1*  --   --   PROT 6.4*  --   --   ALBUMIN 1.9* 2.5* 2.7*   No results for input(s): "LIPASE", "AMYLASE" in the last 168 hours. Recent Labs  Lab 01/26/24 0834 01/26/24 1213   AMMONIA 52* 56*    ABG    Component Value Date/Time   PHART 7.38 01/27/2024 0408   PCO2ART 40 01/27/2024 0408   PO2ART 113 (H) 01/27/2024 0408   HCO3 23.7 01/27/2024 0408   ACIDBASEDEF 1.3 01/27/2024 0408   O2SAT 99.4 01/27/2024 0408     Coagulation Profile: Recent Labs  Lab 01/26/24 0834  INR 1.6*    Cardiac Enzymes: No results for input(s): "CKTOTAL", "CKMB", "CKMBINDEX", "TROPONINI" in the last 168 hours.  HbA1C: Hemoglobin A1C  Date/Time Value Ref Range Status  11/04/2022 12:00 AM 7.8  Final    Comment:    care everywhere  12/06/2015 12:00 AM 5.6  Final   Hgb A1c MFr Bld  Date/Time Value Ref Range Status  01/26/2024 02:15 PM 8.5 (H) 4.8 - 5.6 % Final    Comment:    (NOTE)         Prediabetes: 5.7 - 6.4  Diabetes: >6.4         Glycemic control for adults with diabetes: <7.0   11/17/2018 09:42 AM 9.0 (H) 4.8 - 5.6 % Final    Comment:             Prediabetes: 5.7 - 6.4          Diabetes: >6.4          Glycemic control for adults with diabetes: <7.0     CBG: Recent Labs  Lab 01/28/24 1547 01/28/24 1949 01/28/24 2211 01/29/24 0008 01/29/24 0344  GLUCAP 378* 317* 326* 302* 202*    Review of Systems:   Positives in BOLD: Currently denies all complaints Gen: Denies fever, chills, weight change, fatigue, night sweats HEENT: Denies blurred vision, double vision, hearing loss, tinnitus, sinus congestion, rhinorrhea, sore throat, neck stiffness, dysphagia PULM: Denies shortness of breath, cough, sputum production, hemoptysis, wheezing CV: Denies chest pain, edema, orthopnea, paroxysmal nocturnal dyspnea, palpitations GI: Denies abdominal pain, nausea, vomiting, diarrhea, hematochezia, melena, constipation, change in bowel habits GU: Denies dysuria, hematuria, polyuria, oliguria, urethral discharge Endocrine: Denies hot or cold intolerance, polyuria, polyphagia or appetite change Derm: Denies rash, dry skin, scaling or peeling skin change Heme:  Denies easy bruising, bleeding, bleeding gums Neuro: Denies headache, numbness, weakness, slurred speech, loss of memory or consciousness   Past Medical History:  She,  has a past medical history of Anemia, Anxiety, Arthritis, Cancer (HCC), Chronic pain, Cirrhosis of liver (HCC), Depression, Diabetes mellitus without complication (HCC), Fatty liver disease, nonalcoholic (2021), GERD (gastroesophageal reflux disease), Glaucoma, Hyperlipidemia, Hypertension, Meningioma (HCC), OSA (obstructive sleep apnea) (05/08/2023), Overactive bladder, Pharmacologic therapy (11/02/2023), and Psoriasis (2015).   Surgical History:   Past Surgical History:  Procedure Laterality Date   ABDOMINAL HYSTERECTOMY  2007   CHOLECYSTECTOMY  2000     Social History:   reports that she has never smoked. She has never used smokeless tobacco. She reports that she does not currently use alcohol. She reports that she does not use drugs.   Family History:  Her family history includes Alcohol abuse in her cousin and maternal grandmother; Bipolar disorder in her cousin; Depression in her mother; Diabetes in her mother; Heart disease in her maternal grandfather; Hypertension in her mother; Obesity in her mother; Stroke in her paternal grandfather; Vision loss in her mother.   Allergies No Known Allergies   Home Medications  Prior to Admission medications   Medication Sig Start Date End Date Taking? Authorizing Provider  aspirin 81 MG tablet Take 81 mg by mouth daily.   Yes [provider]  Baclofen 5 MG TABS Take 2 tablets by mouth at bedtime.   Yes [provider]  buPROPion (WELLBUTRIN XL) 150 MG 24 hr tablet Take 1 tablet (150 mg total) by mouth daily. 11/16/23 03/15/24 Yes Hisada, Barbee Cough, MD  canagliflozin [facility]) 300 MG TABS tablet Take by mouth. 02/17/22  Yes [provider]  carvedilol (COREG) 6.25 MG tablet Take 6.25 mg by mouth 2 (two) times daily. 06/19/22  Yes [provider]   citalopram (CELEXA) 20 MG tablet Take 20 mg by mouth daily.   Yes [provider]  Continuous Glucose Sensor (FREESTYLE LIBRE 3 SENSOR) MISC by Does not apply route daily.   Yes [provider]  COSENTYX SENSOREADY, 300 MG, 150 MG/ML SOAJ Inject into the skin.   Yes [provider]  Ferrous Sulfate (SLOW FE) 142 (45 Fe) MG TBCR Take by mouth. 09/12/21  Yes [provider]  furosemide (  LASIX) 40 MG tablet Take 40 mg by mouth 2 (two) times daily. 09/25/23  Yes [provider]  gabapentin (NEURONTIN) 300 MG capsule Take by mouth once. 06/02/23 06/01/24 Yes [provider]  insulin lispro (HUMALOG) 100 UNIT/ML KwikPen Inject into the skin.   Yes [provider]  lactulose (CHRONULAC) 10 GM/15ML solution SMARTSIG:Milliliter(s) By Mouth 06/19/22  Yes [provider]  LANTUS SOLOSTAR 100 UNIT/ML Solostar Pen Inject 66 Units into the skin daily. 11/25/22 01/26/24 Yes [provider]  levETIRAcetam (KEPPRA) 500 MG tablet Take 750 mg by mouth 2 (two) times daily. 01/04/24 07/02/24 Yes [provider]  MAGNESIUM-OXIDE 400 (240 Mg) MG tablet Take 2 tablets by mouth 2 (two) times daily. 10/14/23  Yes [provider]  metFORMIN (GLUCOPHAGE-XR) 500 MG 24 hr tablet Take 1,000 mg by mouth 2 (two) times daily.   Yes [provider]  mirtazapine (REMERON) 15 MG tablet Take 1 tablet (15 mg total) by mouth at bedtime. 11/28/23 01/26/24 Yes Hisada, Barbee Cough, MD  ondansetron (ZOFRAN-ODT) 4 MG disintegrating tablet Dissolve 1 tablet (4 mg total) in the mouth every twelve (12) hours as needed for nausea. 10/05/23 10/04/24 Yes [provider]  pantoprazole (PROTONIX) 40 MG tablet TAKE ONE TABLET BY MOUTH EVERY DAY 10/24/19  Yes McGowan, Carollee Herter A, PA-C  promethazine (PHENERGAN) 12.5 MG tablet Take by mouth. 05/23/22  Yes [provider]  scopolamine (TRANSDERM-SCOP) 1 MG/3DAYS Place 1 patch onto the skin every 3  (three) days. 12/07/23  Yes [provider]  spironolactone (ALDACTONE) 100 MG tablet Take 100 mg by mouth 2 (two) times daily.   Yes [provider]  XIFAXAN 550 MG TABS tablet Take 550 mg by mouth 2 (two) times daily.   Yes [provider]  zolpidem (AMBIEN) 5 MG tablet Take 1 tablet (5 mg total) by mouth at bedtime as needed for sleep. 11/01/23 01/26/24 Yes Neysa Hotter, MD  Accu-Chek Softclix Lancets lancets SMARTSIG:Topical    [provider]  BD PEN NEEDLE NANO 2ND GEN 32G X 4 MM MISC USE 1 NIGHTLY 09/28/23   [provider]  chlorhexidine (PERIDEX) 0.12 % solution SMARTSIG:0.5 Capful(s) By Mouth Morning-Evening Patient not taking: Reported on 01/26/2024 10/24/23   [provider]  clotrimazole (MYCELEX) 10 MG troche Take 10 mg by mouth 5 (five) times daily. Patient not taking: Reported on 01/26/2024 08/20/23   [provider]  Continuous Glucose Receiver (DEXCOM G7 RECEIVER) DEVI USE AS INSTRUCTED WITH G7 SENSORS Patient not taking: Reported on 01/26/2024 09/04/23   [provider]  Continuous Glucose Sensor (DEXCOM G7 SENSOR) MISC USE TO MONITOR BLOOD GLUCOSE LEVELS CONTINUOUSLY. CHANGE SENSOR EVERY 10 DAYS. Patient not taking: Reported on 01/26/2024    [provider]  Dulaglutide (TRULICITY) 1.5 MG/0.5ML SOPN Inject 1.5 mg into the skin once a week. Thursdays Patient not taking: Reported on 01/26/2024    [provider]  insulin glargine-yfgn (SEMGLEE, YFGN,) 100 UNIT/ML Pen INJECT 50 UNITS UNDER THE SKIN NIGHTLY AS DIRECTED BY YOUR DOCTOR AROUND SAME TIME EVERY DAY. TOTAL DAILY DOSE NOT TO EXCEED 50 UNITS Patient not taking: Reported on 01/26/2024 10/06/23   [provider]  Insulin Pen Needle 32G X 6 MM MISC 1 Syringe by Does not apply route. Use with Victoza.    [provider]  naltrexone (DEPADE) 50 MG tablet Take 50 mg by mouth daily.    [provider]  Secukinumab (COSENTYX  SENSOREADY PEN) 150 MG/ML SOAJ Inject the contents of  2 pens (300 mg total) under the skin once a week at weeks 0, 1, 2, 3, and 4. THEN inject the contents of 2 pens (300 mg total) every 4 weeks thereafter. Patient not taking: Reported on 01/26/2024 09/30/23   [provider]  Sharps Container (BD SHARPS COLLECTOR) MISC Use as directed to dispose of Cosentyx pens. 10/05/23   [provider]  simvastatin (ZOCOR) 20 MG tablet TAKE ONE TABLET BY MOUTH EVERY EVENING 10/24/19   McGowan, Wellington Hampshire, PA-C     Critical care provider statement:   Total critical care time: 33 minutes   Performed by: Karna Christmas MD   Critical care time was exclusive of separately billable procedures and treating other patients.   Critical care was necessary to treat or prevent imminent or life-threatening deterioration.   Critical care was time spent personally by me on the following activities: development of treatment plan with patient and/or surrogate as well as nursing, discussions with consultants, evaluation of patient's response to treatment, examination of patient, obtaining history from patient or surrogate, ordering and performing treatments and interventions, ordering and review of laboratory studies, ordering and review of radiographic studies, pulse oximetry and re-evaluation of patient's condition.    Vida Rigger, M.D.  Pulmonary & Critical Care Medicine

## 2024-01-29 NOTE — Progress Notes (Signed)
 [facility]  Date of Admission:  01/26/2024   Total days of inpatient antibiotics 2  Principal Problem:   Septic shock [facility])          Assessment: 58 year old female with history of sleep disorder on Ambien, MDD followed by psychiatry, social anxiety disorder, psoriasis, cirrhosis was found down by family admitted with #Klebsiella oxytoca bacteremia secondary UTI +/- PNA #Cirrhosis with portal hypertension - Patient was hypotensive unresponsive, mechanically intubated on arrival. - Blood cultures grew Klebsiella oxytoca.  UA showed small leukocytes, negative nitrites.  CT abdomen pelvis showed cirrhosis with portal hypertension, no radiographic evidence of urinary tract infection noted on imaging.  Small volume abdominal pelvic ascites noted.  Trace left pleural effusion with adjacent primary consolidation. - RVP negative.  CT head noted soft tissue swelling of left scalp.  Age-indeterminate left cerebral infarct. -Urine CX+ Klebsiella. She states she had dysuyria x 1 week PTA. Recommendations: -Continue ceftriaxone. Switch to bactirm on Monday to complete 2 weeks of abx eot 3/10. On minimal pressors today.   Evaluation of this patient requires complex antimicrobial therapy evaluation and counseling + isolation needs for disease transmission risk assessment and mitigation    Microbiology:   Antibiotics: Ceftriaxone 2/25- Azithromycin 2/25- Refaxamin 2/25 Vancomycin 2/25   Cultures: Blood 2/25 1/2 Klebsiella  oxytoca Urine 2/25  Other 2/25 Rsp Cx re-incuabting   SUBJECTIVE: Restin in bed. No new complaints Interval: Afebrile overnight. Wbc  9.4k  Review of Systems: Review of Systems  All other systems reviewed and are negative.    Scheduled Meds:  vitamin C  500 mg Oral BID   aspirin  81 mg Oral Daily   [START ON 01/30/2024] buPROPion  150 mg Oral Daily   Chlorhexidine Gluconate Cloth  6 each Topical Daily   [START ON 01/30/2024]  citalopram  20 mg Oral Daily   feeding supplement (GLUCERNA SHAKE)  237 mL Oral TID BM   Gerhardt's butt cream   Topical BID   hydrocortisone sod succinate (SOLU-CORTEF) inj  50 mg Intravenous Q12H   insulin aspart  0-20 Units Subcutaneous Q4H   insulin aspart  5 Units Subcutaneous TID WC   insulin glargine  26 Units Subcutaneous QHS   lactulose  20 g Oral BID   levETIRAcetam  750 mg Oral BID   midodrine  10 mg Oral TID WC   mirtazapine  15 mg Oral QHS   multivitamin with minerals  1 tablet Oral Daily   mupirocin ointment  1 Application Nasal BID   pantoprazole (PROTONIX) IV  40 mg Intravenous Q12H   rifaximin  550 mg Oral BID   rosuvastatin  20 mg Oral Daily   sodium chloride flush  10-40 mL Intracatheter Q12H   thiamine  100 mg Oral Daily   zinc sulfate (50mg  elemental zinc)  220 mg Oral Daily   Continuous Infusions:  cefTRIAXone (ROCEPHIN)  IV 200 mL/hr at 01/29/24 1000   norepinephrine (LEVOPHED) Adult infusion Stopped (01/29/24 0913)   PRN Meds:.acetaminophen, docusate sodium, fentaNYL (SUBLIMAZE) injection, mouth rinse, polyethylene glycol, polyvinyl alcohol, sodium chloride flush No Known Allergies  OBJECTIVE: Vitals:   01/29/24 0822 01/29/24 0900 01/29/24 1000 01/29/24 1100  BP: (!) 94/52 (!) 105/52 (!) 93/47 115/64  Pulse:   78 90  Resp: 12 17 (!) 9 (!) 25  Temp: 97.9 F (36.6 C)     TempSrc: Oral     SpO2: 98% (!) 86% 100% 95%  Weight:  Height:       Body mass index is 34.28 kg/m.  Physical Exam Constitutional:      Appearance: Normal appearance.  HENT:     Head: Normocephalic and atraumatic.     Right Ear: Tympanic membrane normal.     Left Ear: Tympanic membrane normal.     Nose: Nose normal.     Mouth/Throat:     Mouth: Mucous membranes are moist.  Eyes:     Extraocular Movements: Extraocular movements intact.     Conjunctiva/sclera: Conjunctivae normal.     Pupils: Pupils are equal, round, and reactive to light.  Cardiovascular:     Rate and  Rhythm: Normal rate and regular rhythm.     Heart sounds: No murmur heard.    No friction rub. No gallop.  Pulmonary:     Effort: Pulmonary effort is normal.     Breath sounds: Normal breath sounds.  Abdominal:     General: Abdomen is flat.     Palpations: Abdomen is soft.  Musculoskeletal:        General: Normal range of motion.  Skin:    General: Skin is warm and dry.  Neurological:     General: No focal deficit present.     Mental Status: She is alert and oriented to person, place, and time.  Psychiatric:        Mood and Affect: Mood normal.       Lab Results Lab Results  Component Value Date   WBC 9.4 01/29/2024   HGB 8.6 (L) 01/29/2024   HCT 26.3 (L) 01/29/2024   MCV 80.4 01/29/2024   PLT 159 01/29/2024    Lab Results  Component Value Date   CREATININE 1.00 01/29/2024   BUN 51 (H) 01/29/2024   NA 128 (L) 01/29/2024   K 3.6 01/29/2024   CL 93 (L) 01/29/2024   CO2 23 01/29/2024    Lab Results  Component Value Date   ALT 26 01/26/2024   AST 71 (H) 01/26/2024   ALKPHOS 204 (H) 01/26/2024   BILITOT 5.1 (H) 01/26/2024        Danelle Earthly, MD [facility] 01/29/2024, 12:38 PM

## 2024-01-30 DIAGNOSIS — A419 Sepsis, unspecified organism: Secondary | ICD-10-CM | POA: Diagnosis not present

## 2024-01-30 DIAGNOSIS — R6521 Severe sepsis with septic shock: Secondary | ICD-10-CM | POA: Diagnosis not present

## 2024-01-30 LAB — RENAL FUNCTION PANEL
Albumin: 2.4 g/dL — ABNORMAL LOW (ref 3.5–5.0)
Anion gap: 8 (ref 5–15)
BUN: 44 mg/dL — ABNORMAL HIGH (ref 6–20)
CO2: 26 mmol/L (ref 22–32)
Calcium: 8.8 mg/dL — ABNORMAL LOW (ref 8.9–10.3)
Chloride: 98 mmol/L (ref 98–111)
Creatinine, Ser: 0.74 mg/dL (ref 0.44–1.00)
GFR, Estimated: >60 mL/min (ref >60)
Glucose, Bld: 186 mg/dL — ABNORMAL HIGH (ref 70–99)
Phosphorus: 2.1 mg/dL — ABNORMAL LOW (ref 2.5–4.6)
Potassium: 4 mmol/L (ref 3.5–5.1)
Sodium: 132 mmol/L — ABNORMAL LOW (ref 135–145)

## 2024-01-30 LAB — CBC
HCT: 24.5 % — ABNORMAL LOW (ref 36.0–46.0)
Hemoglobin: 7.8 g/dL — ABNORMAL LOW (ref 12.0–15.0)
MCH: 25.7 pg — ABNORMAL LOW (ref 26.0–34.0)
MCHC: 31.8 g/dL (ref 30.0–36.0)
MCV: 80.9 fL (ref 80.0–100.0)
Platelets: 124 10*3/uL — ABNORMAL LOW (ref 150–400)
RBC: 3.03 MIL/uL — ABNORMAL LOW (ref 3.87–5.11)
RDW: 18.9 % — ABNORMAL HIGH (ref 11.5–15.5)
WBC: 8.7 10*3/uL (ref 4.0–10.5)
nRBC: 0 % (ref 0.0–0.2)

## 2024-01-30 LAB — CULTURE, BLOOD (ROUTINE X 2)

## 2024-01-30 LAB — GLUCOSE, CAPILLARY
Glucose-Capillary: 152 mg/dL — ABNORMAL HIGH (ref 70–99)
Glucose-Capillary: 170 mg/dL — ABNORMAL HIGH (ref 70–99)
Glucose-Capillary: 173 mg/dL — ABNORMAL HIGH (ref 70–99)
Glucose-Capillary: 181 mg/dL — ABNORMAL HIGH (ref 70–99)
Glucose-Capillary: 192 mg/dL — ABNORMAL HIGH (ref 70–99)
Glucose-Capillary: 256 mg/dL — ABNORMAL HIGH (ref 70–99)
Glucose-Capillary: 268 mg/dL — ABNORMAL HIGH (ref 70–99)

## 2024-01-30 LAB — HEPATITIS B SURFACE ANTIBODY, QUANTITATIVE: Hep B S AB Quant (Post): <3.5 m[IU]/mL — ABNORMAL LOW

## 2024-01-30 LAB — MAGNESIUM: Magnesium: 2.6 mg/dL — ABNORMAL HIGH (ref 1.7–2.4)

## 2024-01-30 MED ORDER — PREDNISONE 20 MG PO TABS
20.0000 mg | ORAL_TABLET | Freq: Every day | ORAL | Status: AC
  Administered 2024-02-06: 20 mg via ORAL
  Filled 2024-01-30: qty 1

## 2024-01-30 MED ORDER — PREDNISONE 10 MG PO TABS
10.0000 mg | ORAL_TABLET | Freq: Every day | ORAL | Status: AC
  Administered 2024-02-08: 10 mg via ORAL
  Filled 2024-01-30: qty 1

## 2024-01-30 MED ORDER — PREDNISONE 20 MG PO TABS
35.0000 mg | ORAL_TABLET | Freq: Every day | ORAL | Status: AC
  Administered 2024-02-03: 35 mg via ORAL
  Filled 2024-01-30: qty 2

## 2024-01-30 MED ORDER — PREDNISONE 20 MG PO TABS
40.0000 mg | ORAL_TABLET | Freq: Every day | ORAL | Status: AC
  Administered 2024-02-02: 40 mg via ORAL
  Filled 2024-01-30: qty 2

## 2024-01-30 MED ORDER — PREDNISONE 20 MG PO TABS
30.0000 mg | ORAL_TABLET | Freq: Every day | ORAL | Status: AC
  Administered 2024-02-04: 30 mg via ORAL
  Filled 2024-01-30: qty 1

## 2024-01-30 MED ORDER — PREDNISONE 20 MG PO TABS
25.0000 mg | ORAL_TABLET | Freq: Every day | ORAL | Status: AC
  Administered 2024-02-05: 25 mg via ORAL
  Filled 2024-01-30: qty 1

## 2024-01-30 MED ORDER — PREDNISONE 20 MG PO TABS
50.0000 mg | ORAL_TABLET | Freq: Every day | ORAL | Status: AC
  Administered 2024-01-31: 50 mg via ORAL
  Filled 2024-01-30: qty 1

## 2024-01-30 MED ORDER — PREDNISONE 10 MG PO TABS
15.0000 mg | ORAL_TABLET | Freq: Every day | ORAL | Status: AC
  Administered 2024-02-07: 15 mg via ORAL
  Filled 2024-01-30: qty 2

## 2024-01-30 MED ORDER — PREDNISONE 20 MG PO TABS
45.0000 mg | ORAL_TABLET | Freq: Every day | ORAL | Status: AC
  Administered 2024-02-01: 45 mg via ORAL
  Filled 2024-01-30: qty 1

## 2024-01-30 MED ORDER — PREDNISONE 10 MG PO TABS: 5.0000 mg | ORAL_TABLET | Freq: Every day | ORAL | Status: DC

## 2024-01-30 MED ORDER — K PHOS MONO-SOD PHOS DI & MONO 155-852-130 MG PO TABS
500.0000 mg | ORAL_TABLET | Freq: Once | ORAL | Status: AC
  Administered 2024-01-30: 500 mg via ORAL
  Filled 2024-01-30: qty 2

## 2024-01-30 NOTE — Progress Notes (Signed)
 There was a consult for placing a PIV access due to removing CVC. Assessed both arms with [place]. There wasn't suitable veins on Rt. Arm and Lt. Arm has only one site may place a PIV access ( Lt. UA cephalic vein). Informed patient's RN regarding this finding and recommended keeping CVC for few more days and re-evaluate veins. HS [employer]

## 2024-01-30 NOTE — Progress Notes (Signed)
 PHARMACY CONSULT NOTE - FOLLOW UP  Pharmacy Consult for Electrolyte Monitoring and Replacement   Recent Labs: Potassium (mmol/L)  Date Value  01/30/2024 4.0   Magnesium (mg/dL)  Date Value  16/09/9603 2.6 (H)   Calcium (mg/dL)  Date Value  54/08/8118 8.8 (L)   Albumin (g/dL)  Date Value  14/78/2956 2.4 (L)  11/17/2018 3.9   Phosphorus (mg/dL)  Date Value  21/30/8657 2.1 (L)   Sodium (mmol/L)  Date Value  01/30/2024 132 (L)  11/17/2018 141   Assessment: Patient is a 58 year old female with a past medical history of chronic pain syndrome, cirrhosis, and diabetes who was found unresponsive on her couch this morning. Pharmacy has been consulted to monitor and replace electrolytes as needed.  Goal of Therapy:  Electrolytes WNL  Plan:  500 mg phosphorous (contains phosphorus 16 mMol, potassium 2.2 mEq) Continue to monitor electrolytes daily  Lowella Bandy, PharmD 01/30/2024 7:01 AM

## 2024-01-30 NOTE — Plan of Care (Signed)
 Problem: Education: Goal: Knowledge of General Education information will improve Description: Including pain rating scale, medication(s)/side effects and non-pharmacologic comfort measures Outcome: Progressing   Problem: Health Behavior/Discharge Planning: Goal: Ability to manage health-related needs will improve Outcome: Progressing   Problem: Clinical Measurements: Goal: Ability to maintain clinical measurements within normal limits will improve Outcome: Progressing Goal: Will remain free from infection Outcome: Progressing Goal: Diagnostic test results will improve Outcome: Progressing Goal: Respiratory complications will improve Outcome: Progressing Goal: Cardiovascular complication will be avoided Outcome: Progressing   Problem: Activity: Goal: Risk for activity intolerance will decrease Outcome: Progressing   Problem: Nutrition: Goal: Adequate nutrition will be maintained Outcome: Progressing   Problem: Coping: Goal: Level of anxiety will decrease Outcome: Progressing   Problem: Elimination: Goal: Will not experience complications related to bowel motility Outcome: Progressing Goal: Will not experience complications related to urinary retention Outcome: Progressing   Problem: Pain Managment: Goal: General experience of comfort will improve and/or be controlled Outcome: Progressing   Problem: Safety: Goal: Ability to remain free from injury will improve Outcome: Progressing   Problem: Skin Integrity: Goal: Risk for impaired skin integrity will decrease Outcome: Progressing   Problem: Coping: Goal: Ability to adjust to condition or change in health will improve Outcome: Progressing   Problem: Fluid Volume: Goal: Ability to maintain a balanced intake and output will improve Outcome: Progressing   Problem: Metabolic: Goal: Ability to maintain appropriate glucose levels will improve Outcome: Progressing   Problem: Nutritional: Goal: Maintenance of  adequate nutrition will improve Outcome: Progressing Goal: Progress toward achieving an optimal weight will improve Outcome: Progressing   Problem: Tissue Perfusion: Goal: Adequacy of tissue perfusion will improve Outcome: Progressing

## 2024-01-30 NOTE — Progress Notes (Signed)
 Progress Note   Patient: Abigail Molina ZOX:[id number] DOB: January 11, 1966 DOA: 01/26/2024     4 DOS: the patient was seen and examined on 01/30/2024   Brief hospital course: 58 y.o. female admitted with Acute Metabolic Encephalopathy and Severe Sepsis with Septic Shock in the setting of Klebsiella Oxytoca Bacteremia due to UTI and suspected pneumonia, along with NSTEMI, Acute Kidney Injury and Hyponatremia. Required intubation and mechanical ventilation, now extubated.  Nephrology both gave to AKI, cardiology on board due to concerns of NSTEMI with peak troponin 4087.  Assessment and Plan:   Acute toxic metabolic encephalopathy  #Mechanical intubation pain/discomfort  Hx: Meningioma and seizures  CT scan of the brain did not show any acute intracranial pathology Continue Keppra Monitor neurochecks closely   #Septic shock -present on admission- due to Klebsiella bacteremia and UTI #NSTEMI #Intermittent wide complex tachycardia  #Intermittent vtach #Intermittent bradycardia  Hx: HTN and HLD  Echocardiogram showed EF greater than 55% Cardiology following Continue aspirin 81 mg daily, start Crestor 20 mg daily -Hold outpatient antihypertensives and diuretic therapy    #Severe Sepsis- PRESENT ON ADMISSION - DUE TO BACTEREMIA WITH KLEBSIELLA #Klebsiella Oxytoca BACTEREMIA #Suspected Pneumonia and UTI Monitor culture results Infectious disease on board Continue current antibiotics   #Acute respiratory failure  #Possible pneumonia  Patient with history of obstructive sleep apnea Patient extubated on 01/27/2024 and transferred to [facility] on 01/30/2024 Continue breathing treatments as recommended by pulmonologist Continue steroid therapy tapering   #Severe hyponatremia ~ IMPROVING #Acute kidney injury secondary to ATN  ~ IMPROVING #Non anion gap metabolic acidosis ~ IMPROVING #Hyperkalemia ~ RESOLVED #Lactic acidosis  Continue to monitor renal function Avoid nephrotoxic medications Renally  dose all drugs Nephrologist on board we appreciate input Monitor input and output   #NASH cirrhosis  #Portal hypertension  Hx: Esophageal varices  Continue outpatient lactulose and rifaximin    #Anemia with no signs of obvious bleeding  Monitor CBC We will transfuse when hemoglobin less than 7   #Type II diabetes mellitus  Monitor glucose level closely Continue insulin therapy   #Stage II pressure ulcer present on admission  Continue wound care management        Subjective:  Patient seen and examined at bedside this morning Denies nausea vomiting chest pain cough  Respiratory function improving  Physical Exam: General: Acutely on chronically on intranasal oxygen HENT: Atraumatic, normocephalic, neck Supple, no JVD  Lungs: Clear diminished throughout, even, non labored  Cardiovascular: Regular rate and rhythm, S1-S2 present no Mamma Abdomen: +BS x4, obese mildly distended Extremities: Normal bulk and tone, no deformities Skin: Stage II sacral spine pressure ulcer with erythema with abrasions present on buttocks present on admission  Vitals:   01/30/24 1200 01/30/24 1230 01/30/24 1300 01/30/24 1400  BP: (!) 109/58  (!) 114/52 (!) 102/57  Pulse: 77 80 84 81  Resp: 16 19 (!) 23 15  Temp: 97.9 F (36.6 C)     TempSrc: Axillary     SpO2: 100% 92% 96% 95%  Weight:      Height:        Data Reviewed: I have reviewed the patient's chest x-ray that shows no pleural effusion or pneumothorax.  Findings of cardiomegaly noted    Latest Ref Rng & Units 01/30/2024    4:26 AM 01/29/2024    3:34 AM 01/28/2024   10:57 PM  CBC  WBC 4.0 - 10.5 K/uL 8.7  9.4    Hemoglobin 12.0 - 15.0 g/dL 7.8  8.6  8.1  Hematocrit 36.0 - 46.0 % 24.5  26.3  24.5   Platelets 150 - 400 K/uL 124  159         Latest Ref Rng & Units 01/30/2024    4:26 AM 01/29/2024    6:57 PM 01/29/2024    2:18 PM  BMP  Glucose 70 - 99 mg/dL 578     BUN 6 - 20 mg/dL 44     Creatinine 4.69 - 1.00 mg/dL 6.29      Sodium 528 - 145 mmol/L 132  132  128   Potassium 3.5 - 5.1 mmol/L 4.0     Chloride 98 - 111 mmol/L 98     CO2 22 - 32 mmol/L 26     Calcium 8.9 - 10.3 mg/dL 8.8         Author: Loyce Dys, MD 01/30/2024 2:57 PM  For on call review [email or web address].

## 2024-01-30 NOTE — Progress Notes (Signed)
 [facility] Cardiology  SUBJECTIVE: Patient laying in bed, denies chest pain or shortness of breath   Vitals:   01/30/24 0500 01/30/24 0600 01/30/24 0700 01/30/24 0730  BP: (!) 100/53 (!) 96/59 100/69   Pulse: 74 76 75 69  Resp: 10 11 13 14   Temp:      TempSrc:      SpO2: 99% 100% 100% 94%  Weight:      Height:         Intake/Output Summary (Last 24 hours) at 01/30/2024 1610 Last data filed at 01/30/2024 0600 Gross per 24 hour  Intake 1160.44 ml  Output 2125 ml  Net -964.56 ml      PHYSICAL EXAM  General: Well developed, well nourished, in no acute distress HEENT:  Normocephalic and atramatic Neck:  No JVD.  Lungs: Clear bilaterally to auscultation and percussion. Heart: HRRR . Normal S1 and S2 without gallops or murmurs.  Abdomen: Bowel sounds are positive, abdomen soft and non-tender  Msk:  Back normal, normal gait. Normal strength and tone for age. Extremities: No clubbing, cyanosis or edema.   Neuro: Alert and oriented X 3. Psych:  Good affect, responds appropriately   LABS: Basic Metabolic Panel: Recent Labs    01/29/24 0334 01/29/24 0822 01/29/24 1857 01/30/24 0426  NA 128*   < > 132* 132*  K 3.6  --   --  4.0  CL 93*  --   --  98  CO2 23  --   --  26  GLUCOSE 228*  --   --  186*  BUN 51*  --   --  44*  CREATININE 1.00  --   --  0.74  CALCIUM 8.8*  --   --  8.8*  MG 2.6*  --   --  2.6*  PHOS 2.8  --   --  2.1*   < > = values in this interval not displayed.   Liver Function Tests: Recent Labs    01/29/24 0334 01/30/24 0426  ALBUMIN 2.7* 2.4*   No results for input(s): "LIPASE", "AMYLASE" in the last 72 hours. CBC: Recent Labs    01/29/24 0334 01/30/24 0426  WBC 9.4 8.7  HGB 8.6* 7.8*  HCT 26.3* 24.5*  MCV 80.4 80.9  PLT 159 124*   Cardiac Enzymes: No results for input(s): "CKTOTAL", "CKMB", "CKMBINDEX", "TROPONINI" in the last 72 hours. BNP: Invalid input(s): "POCBNP" D-Dimer: No results for input(s): "DDIMER" in the last 72  hours. Hemoglobin A1C: No results for input(s): "HGBA1C" in the last 72 hours. Fasting Lipid Panel: No results for input(s): "CHOL", "HDL", "LDLCALC", "TRIG", "CHOLHDL", "LDLDIRECT" in the last 72 hours. Thyroid Function Tests: No results for input(s): "TSH", "T4TOTAL", "T3FREE", "THYROIDAB" in the last 72 hours.  Invalid input(s): "FREET3" Anemia Panel: No results for input(s): "VITAMINB12", "FOLATE", "FERRITIN", "TIBC", "IRON", "RETICCTPCT" in the last 72 hours.  No results found.   Echo EF greater than 55%  TELEMETRY: Sinus rhythm with left bundle branch block:  ASSESSMENT AND PLAN:  Principal Problem:   Septic shock (HCC)    1.  NSTEMI, (179, 591, 886, 1349, 2680, 4087, 3717), the setting of septic shock, respiratory failure, acute metabolic encephalopathy, now extubated, off of vasopressors, likely and hemodynamically improved, ECG nondiagnostic, LV function normal on 2D echocardiogram 2.  Septic shock/respiratory failure/acute metabolic encephalopathy, clinically improved 3.  Anemia, hemoglobin macro 7.8 and 24.5, respectively  Recommendations  1.  Agree with current therapy 2.  Tentatively plan for cardiac catheterization with coronary arteriography to  rule out significant underlying coronary artery disease on 02/02/2024    Marcina Millard, MD, PhD, [facility] 01/30/2024 8:42 AM

## 2024-01-30 NOTE — Progress Notes (Signed)
 [facility]  ROUNDING NOTE   Subjective:   Patient with some abdominal pain this morning.   Sodium 132 (128)  Urine output 2125 (1100 mL)  Objective:  Vital signs in last 24 hours:  Temp:  [97.8 F (36.6 C)-99.3 F (37.4 C)] 97.9 F (36.6 C) (03/01 0400) Pulse Rate:  [45-168] 84 (03/01 0900) Resp:  [10-24] 14 (03/01 0900) BP: (96-117)/(51-76) 103/62 (03/01 0900) SpO2:  [90 %-100 %] 100 % (03/01 0900) Weight:  [95.3 kg] 95.3 kg (03/01 0400)  Weight change: 4.7 kg Filed Weights   01/28/24 0400 01/29/24 0431 01/30/24 0400  Weight: 90.3 kg 90.6 kg 95.3 kg    Intake/Output: I/O last 3 completed shifts: In: 2483.9 [P.O.:1640; I.V.:414; Blood:330; IV Piggyback:99.8] Out: 3225 [Urine:3225]   Intake/Output this shift:  No intake/output data recorded.  Physical Exam: General: No acute distress, laying in bed  Head: Normocephalic, atraumatic. Moist oral mucosal membranes  Neck: Supple  Lungs:  Diminished, normal effort  Heart: regular  Abdomen:  Soft, nontender, bowel sounds present  Extremities:  no peripheral edema.  Neurologic: Awake, alert, following commands  Skin: No acute rash  Access: No hemodialysis access    Basic Metabolic Panel: Recent Labs  Lab 01/26/24 1153 01/26/24 1213 01/27/24 0010 01/27/24 0414 01/28/24 0454 01/28/24 1008 01/29/24 0334 01/29/24 0822 01/29/24 1218 01/29/24 1418 01/29/24 1857 01/30/24 0426  NA  --    < > 122* 125* 121*   < > 128* 128* 130* 128* 132* 132*  K  --    < > 5.4* 4.7 4.2  --  3.6  --   --   --   --  4.0  CL  --    < > 90* 93* 87*  --  93*  --   --   --   --  98  CO2  --    < > 19* 21* 23  --  23  --   --   --   --  26  GLUCOSE  --    < > 327* 151* 151*  --  228*  --   --   --   --  186*  BUN  --    < > 57* 54* 49*  --  51*  --   --   --   --  44*  CREATININE  --    < > 1.69* 1.47* 1.19*  --  1.00  --   --   --   --  0.74  CALCIUM  --    < > 8.9 8.9 8.5*  --  8.8*  --   --   --   --  8.8*  MG 2.1  --    --  2.3 2.3  --  2.6*  --   --   --   --  2.6*  PHOS 5.1*  --   --  3.3 2.3*  --  2.8  --   --   --   --  2.1*   < > = values in this interval not displayed.    Liver Function Tests: Recent Labs  Lab 01/26/24 0834 01/28/24 0454 01/29/24 0334 01/30/24 0426  AST 71*  --   --   --   ALT 26  --   --   --   ALKPHOS 204*  --   --   --   BILITOT 5.1*  --   --   --   PROT 6.4*  --   --   --  ALBUMIN 1.9* 2.5* 2.7* 2.4*   No results for input(s): "LIPASE", "AMYLASE" in the last 168 hours. Recent Labs  Lab 01/26/24 0834 01/26/24 1213  AMMONIA 52* 56*    CBC: Recent Labs  Lab 01/26/24 0834 01/26/24 2038 01/27/24 0414 01/28/24 0454 01/28/24 1250 01/28/24 1447 01/28/24 2257 01/29/24 0334 01/30/24 0426  WBC 40.9* 58.6* 50.9* 25.6*  --   --   --  9.4 8.7  NEUTROABS 36.1* 53.3* 43.5*  --   --   --   --   --   --   HGB 7.2* 8.0* 8.2* 6.6* 6.8* 6.8* 8.1* 8.6* 7.8*  HCT 21.9* 25.2* 24.9* 20.5* 21.0* 21.0* 24.5* 26.3* 24.5*  MCV 80.8 82.6 81.4 79.5*  --   --   --  80.4 80.9  PLT 185 385 416* 243  --   --   --  159 124*    Cardiac Enzymes: No results for input(s): "CKTOTAL", "CKMB", "CKMBINDEX", "TROPONINI" in the last 168 hours.  BNP: Invalid input(s): "POCBNP"  CBG: Recent Labs  Lab 01/29/24 1931 01/29/24 2200 01/29/24 2357 01/30/24 0425 01/30/24 0748  GLUCAP 203* 182* 196* 173* 152*    Microbiology: Results for orders placed or performed during the hospital encounter of 01/26/24  Resp panel by RT-PCR (RSV, Flu A&B, Covid) Anterior Nasal Swab     Status: None   Collection Time: 01/26/24  8:34 AM   Specimen: Anterior Nasal Swab  Result Value Ref Range Status   SARS Coronavirus 2 by RT PCR NEGATIVE NEGATIVE Final    Comment: (NOTE) SARS-CoV-2 target nucleic acids are NOT DETECTED.  The SARS-CoV-2 RNA is generally detectable in upper respiratory specimens during the acute phase of infection. The lowest concentration of SARS-CoV-2 viral copies this assay can  detect is 138 copies/mL. A negative result does not preclude SARS-Cov-2 infection and should not be used as the sole basis for treatment or other patient management decisions. A negative result may occur with  improper specimen collection/handling, submission of specimen other than nasopharyngeal swab, presence of viral mutation(s) within the areas targeted by this assay, and inadequate number of viral copies(<138 copies/mL). A negative result must be combined with clinical observations, patient history, and epidemiological information. The expected result is Negative.  Fact Sheet for Patients:  BloggerCourse.com  Fact Sheet for Healthcare Providers:  SeriousBroker.it  This test is no t yet approved or cleared by the [place] FDA and  has been authorized for detection and/or diagnosis of SARS-CoV-2 by FDA under an Emergency Use Authorization (EUA). This EUA will remain  in effect (meaning this test can be used) for the duration of the COVID-19 declaration under Section 564(b)(1) of the Act, 21 U.S.C.section 360bbb-3(b)(1), unless the authorization is terminated  or revoked sooner.       Influenza A by PCR NEGATIVE NEGATIVE Final   Influenza B by PCR NEGATIVE NEGATIVE Final    Comment: (NOTE) The Xpert Xpress SARS-CoV-2/FLU/RSV plus assay is intended as an aid in the diagnosis of influenza from Nasopharyngeal swab specimens and should not be used as a sole basis for treatment. Nasal washings and aspirates are unacceptable for Xpert Xpress SARS-CoV-2/FLU/RSV testing.  Fact Sheet for Patients: BloggerCourse.com  Fact Sheet for Healthcare Providers: SeriousBroker.it  This test is not yet approved or cleared by the [place] FDA and has been authorized for detection and/or diagnosis of SARS-CoV-2 by FDA under an Emergency Use Authorization (EUA). This EUA will remain in  effect (meaning this test can be used) for  the duration of the COVID-19 declaration under Section 564(b)(1) of the Act, 21 U.S.C. section 360bbb-3(b)(1), unless the authorization is terminated or revoked.     Resp Syncytial Virus by PCR NEGATIVE NEGATIVE Final    Comment: (NOTE) Fact Sheet for Patients: BloggerCourse.com  Fact Sheet for Healthcare Providers: SeriousBroker.it  This test is not yet approved or cleared by the [place] FDA and has been authorized for detection and/or diagnosis of SARS-CoV-2 by FDA under an Emergency Use Authorization (EUA). This EUA will remain in effect (meaning this test can be used) for the duration of the COVID-19 declaration under Section 564(b)(1) of the Act, 21 U.S.C. section 360bbb-3(b)(1), unless the authorization is terminated or revoked.  Performed at [facility], [address], [place], [place] [address]   Blood Culture (routine x 2)     Status: Abnormal   Collection Time: 01/26/24  8:34 AM   Specimen: BLOOD  Result Value Ref Range Status   Specimen Description   Final    BLOOD RIGHT AC Performed at [facility], [address], [place], [place] [address]    Special Requests   Final    BOTTLES DRAWN AEROBIC AND ANAEROBIC Blood Culture results may not be optimal due to an inadequate volume of blood received in culture bottles Performed at [facility], [address], [place], [place] [address]    Culture  Setup Time   Final    GRAM NEGATIVE RODS IN BOTH AEROBIC AND ANAEROBIC BOTTLES CRITICAL RESULT CALLED TO, READ BACK BY AND VERIFIED WITH: JASON ROBBINS PHARMD @0240  01/27/24 ASW    Culture KLEBSIELLA OXYTOCA (A)  Final   Report Status 01/29/2024 FINAL  Final   Organism ID, Bacteria KLEBSIELLA OXYTOCA  Final      Susceptibility   Klebsiella oxytoca - MIC*    AMPICILLIN >=32 RESISTANT Resistant     CEFEPIME <=0.12 SENSITIVE Sensitive      CEFTAZIDIME <=1 SENSITIVE Sensitive     CEFTRIAXONE <=0.25 SENSITIVE Sensitive     CIPROFLOXACIN <=0.25 SENSITIVE Sensitive     GENTAMICIN <=1 SENSITIVE Sensitive     IMIPENEM <=0.25 SENSITIVE Sensitive     TRIMETH/SULFA <=20 SENSITIVE Sensitive     AMPICILLIN/SULBACTAM 8 SENSITIVE Sensitive     PIP/TAZO <=4 SENSITIVE Sensitive ug/mL    * KLEBSIELLA OXYTOCA  Blood Culture (routine x 2)     Status: Abnormal   Collection Time: 01/26/24  8:34 AM   Specimen: BLOOD  Result Value Ref Range Status   Specimen Description   Final    BLOOD LEFT ANTECUBITAL Performed at [facility], [address] [place], [place] [address]    Special Requests   Final    BOTTLES DRAWN AEROBIC AND ANAEROBIC Blood Culture results may not be optimal due to an inadequate volume of blood received in culture bottles Performed at [facility], [address] [place], [place] [address]    Culture  Setup Time   Final    GRAM POSITIVE COCCI IN BOTH AEROBIC AND ANAEROBIC BOTTLES CRITICAL RESULT CALLED TO, READ BACK BY AND VERIFIED WITH: EMILY STEINBOCH 01/27/24 1439 KLW    Culture (A)  Final    STAPHYLOCOCCUS EPIDERMIDIS THE SIGNIFICANCE OF ISOLATING THIS ORGANISM FROM A SINGLE SET OF BLOOD CULTURES WHEN MULTIPLE SETS ARE DRAWN IS UNCERTAIN. PLEASE NOTIFY THE MICROBIOLOGY DEPARTMENT WITHIN ONE WEEK IF SPECIATION AND SENSITIVITIES ARE REQUIRED. Performed at [facility], [address], [place], [place] [address]    Report Status  01/30/2024 FINAL  Final  Urine Culture     Status: Abnormal   Collection Time: 01/26/24  8:34 AM   Specimen: Urine, Clean Catch  Result Value Ref Range Status   Specimen Description   Final    URINE, CLEAN CATCH Performed at [facility], [address], [place], [place] [address]    Special Requests   Final    NONE Reflexed from [id number] Performed at [facility], [address] [place], [place] [address]    Culture >=100,000 COLONIES/mL  KLEBSIELLA OXYTOCA (A)  Final   Report Status 01/28/2024 FINAL  Final   Organism ID, Bacteria KLEBSIELLA OXYTOCA (A)  Final      Susceptibility   Klebsiella oxytoca - MIC*    AMPICILLIN >=32 RESISTANT Resistant     CEFEPIME <=0.12 SENSITIVE Sensitive     CEFTRIAXONE <=0.25 SENSITIVE Sensitive     CIPROFLOXACIN <=0.25 SENSITIVE Sensitive     GENTAMICIN <=1 SENSITIVE Sensitive     IMIPENEM <=0.25 SENSITIVE Sensitive     NITROFURANTOIN <=16 SENSITIVE Sensitive     TRIMETH/SULFA <=20 SENSITIVE Sensitive     AMPICILLIN/SULBACTAM 8 SENSITIVE Sensitive     PIP/TAZO <=4 SENSITIVE Sensitive ug/mL    * >=100,000 COLONIES/mL KLEBSIELLA OXYTOCA  Blood Culture ID Panel (Reflexed)     Status: Abnormal   Collection Time: 01/26/24  8:34 AM  Result Value Ref Range Status   Enterococcus faecalis NOT DETECTED NOT DETECTED Final   Enterococcus Faecium NOT DETECTED NOT DETECTED Final   Listeria monocytogenes NOT DETECTED NOT DETECTED Final   Staphylococcus species NOT DETECTED NOT DETECTED Final   Staphylococcus aureus (BCID) NOT DETECTED NOT DETECTED Final   Staphylococcus epidermidis NOT DETECTED NOT DETECTED Final   Staphylococcus lugdunensis NOT DETECTED NOT DETECTED Final   Streptococcus species NOT DETECTED NOT DETECTED Final   Streptococcus agalactiae NOT DETECTED NOT DETECTED Final   Streptococcus pneumoniae NOT DETECTED NOT DETECTED Final   Streptococcus pyogenes NOT DETECTED NOT DETECTED Final   A.calcoaceticus-baumannii NOT DETECTED NOT DETECTED Final   Bacteroides fragilis NOT DETECTED NOT DETECTED Final   Enterobacterales DETECTED (A) NOT DETECTED Final    Comment: Enterobacterales represent a large order of gram negative bacteria, not a single organism. CRITICAL RESULT CALLED TO, READ BACK BY AND VERIFIED WITH: JASON ROBBINS PHARMD @0240  01/27/24 ASW    Enterobacter cloacae complex NOT DETECTED NOT DETECTED Final   Escherichia coli NOT DETECTED NOT DETECTED Final   Klebsiella aerogenes  NOT DETECTED NOT DETECTED Final   Klebsiella oxytoca DETECTED (A) NOT DETECTED Final    Comment: CRITICAL RESULT CALLED TO, READ BACK BY AND VERIFIED WITH: JASON ROBBINS PHARMD @0240  01/27/24 ASW    Klebsiella pneumoniae NOT DETECTED NOT DETECTED Final   Proteus species NOT DETECTED NOT DETECTED Final   Salmonella species NOT DETECTED NOT DETECTED Final   Serratia marcescens NOT DETECTED NOT DETECTED Final   Haemophilus influenzae NOT DETECTED NOT DETECTED Final   Neisseria meningitidis NOT DETECTED NOT DETECTED Final   Pseudomonas aeruginosa NOT DETECTED NOT DETECTED Final   Stenotrophomonas maltophilia NOT DETECTED NOT DETECTED Final   Candida albicans NOT DETECTED NOT DETECTED Final   Candida auris NOT DETECTED NOT DETECTED Final   Candida glabrata NOT DETECTED NOT DETECTED Final   Candida krusei NOT DETECTED NOT DETECTED Final   Candida parapsilosis NOT DETECTED NOT DETECTED Final   Candida tropicalis NOT DETECTED NOT DETECTED Final   Cryptococcus neoformans/gattii NOT DETECTED NOT DETECTED Final   CTX-M ESBL NOT  DETECTED NOT DETECTED Final   Carbapenem resistance IMP NOT DETECTED NOT DETECTED Final   Carbapenem resistance KPC NOT DETECTED NOT DETECTED Final   Carbapenem resistance NDM NOT DETECTED NOT DETECTED Final   Carbapenem resist OXA 48 LIKE NOT DETECTED NOT DETECTED Final   Carbapenem resistance VIM NOT DETECTED NOT DETECTED Final    Comment: Performed at [facility], [address] [place], [place] [address]  Blood Culture ID Panel (Reflexed)     Status: Abnormal   Collection Time: 01/26/24  8:34 AM  Result Value Ref Range Status   Enterococcus faecalis NOT DETECTED NOT DETECTED Final   Enterococcus Faecium NOT DETECTED NOT DETECTED Final   Listeria monocytogenes NOT DETECTED NOT DETECTED Final   Staphylococcus species DETECTED (A) NOT DETECTED Final    Comment: CRITICAL RESULT CALLED TO, READ BACK BY AND VERIFIED WITH: EMILY STEINBOCH 01/27/24 1439 KLW     Staphylococcus aureus (BCID) NOT DETECTED NOT DETECTED Final   Staphylococcus epidermidis DETECTED (A) NOT DETECTED Final    Comment: Methicillin (oxacillin) resistant coagulase negative staphylococcus. Possible blood culture contaminant (unless isolated from more than one blood culture draw or clinical case suggests pathogenicity). No antibiotic treatment is indicated for blood  culture contaminants. CRITICAL RESULT CALLED TO, READ BACK BY AND VERIFIED WITH: EMILY STEINBOCH 01/27/24 1439 KLW    Staphylococcus lugdunensis NOT DETECTED NOT DETECTED Final   Streptococcus species NOT DETECTED NOT DETECTED Final   Streptococcus agalactiae NOT DETECTED NOT DETECTED Final   Streptococcus pneumoniae NOT DETECTED NOT DETECTED Final   Streptococcus pyogenes NOT DETECTED NOT DETECTED Final   A.calcoaceticus-baumannii NOT DETECTED NOT DETECTED Final   Bacteroides fragilis NOT DETECTED NOT DETECTED Final   Enterobacterales NOT DETECTED NOT DETECTED Final   Enterobacter cloacae complex NOT DETECTED NOT DETECTED Final   Escherichia coli NOT DETECTED NOT DETECTED Final   Klebsiella aerogenes NOT DETECTED NOT DETECTED Final   Klebsiella oxytoca NOT DETECTED NOT DETECTED Final   Klebsiella pneumoniae NOT DETECTED NOT DETECTED Final   Proteus species NOT DETECTED NOT DETECTED Final   Salmonella species NOT DETECTED NOT DETECTED Final   Serratia marcescens NOT DETECTED NOT DETECTED Final   Haemophilus influenzae NOT DETECTED NOT DETECTED Final   Neisseria meningitidis NOT DETECTED NOT DETECTED Final   Pseudomonas aeruginosa NOT DETECTED NOT DETECTED Final   Stenotrophomonas maltophilia NOT DETECTED NOT DETECTED Final   Candida albicans NOT DETECTED NOT DETECTED Final   Candida auris NOT DETECTED NOT DETECTED Final   Candida glabrata NOT DETECTED NOT DETECTED Final   Candida krusei NOT DETECTED NOT DETECTED Final   Candida parapsilosis NOT DETECTED NOT DETECTED Final   Candida tropicalis NOT DETECTED NOT  DETECTED Final   Cryptococcus neoformans/gattii NOT DETECTED NOT DETECTED Final   Methicillin resistance mecA/C DETECTED (A) NOT DETECTED Final    Comment: CRITICAL RESULT CALLED TO, READ BACK BY AND VERIFIED WITH: EMILY STEINBOCH 01/27/24 1439 KLW Performed at [facility], [address] [place], [place] [address]   MRSA Next Gen by PCR, Nasal     Status: Abnormal   Collection Time: 01/26/24 11:42 AM   Specimen: Nasal Mucosa; Nasal Swab  Result Value Ref Range Status   MRSA by PCR Next Gen DETECTED (A) NOT DETECTED Final    Comment: RESULT CALLED TO, READ BACK BY AND VERIFIED WITH: Claudette Stapler 01/26/24 1315 MW (NOTE) The GeneXpert MRSA Assay (FDA approved for NASAL specimens only), is one component of a comprehensive MRSA colonization surveillance program. It is not  intended to diagnose MRSA infection nor to guide or monitor treatment for MRSA infections. Test performance is not FDA approved in patients less than 32 years old. Performed at [facility], [address] [place], [place] [address]   Culture, Respiratory w Gram Stain     Status: None   Collection Time: 01/26/24  2:06 PM   Specimen: Tracheal Aspirate; Respiratory  Result Value Ref Range Status   Specimen Description TRACHEAL ASPIRATE  Final   Special Requests NONE  Final   Gram Stain   Final    RARE WBC PRESENT, PREDOMINANTLY PMN NO ORGANISMS SEEN    Culture   Final    RARE Normal respiratory flora-no Staph aureus or Pseudomonas seen Performed at [facility], [address], [place], [place] [address]    Report Status 01/29/2024 FINAL  Final  Respiratory (~20 pathogens) panel by PCR     Status: None   Collection Time: 01/26/24  7:00 PM   Specimen: Nasopharyngeal Swab; Respiratory  Result Value Ref Range Status   Adenovirus NOT DETECTED NOT DETECTED Final   Coronavirus 229E NOT DETECTED NOT DETECTED Final    Comment: (NOTE) The Coronavirus on the Respiratory Panel, DOES NOT test  for the novel  Coronavirus (2019 nCoV)    Coronavirus HKU1 NOT DETECTED NOT DETECTED Final   Coronavirus NL63 NOT DETECTED NOT DETECTED Final   Coronavirus OC43 NOT DETECTED NOT DETECTED Final   Metapneumovirus NOT DETECTED NOT DETECTED Final   Rhinovirus / Enterovirus NOT DETECTED NOT DETECTED Final   Influenza A NOT DETECTED NOT DETECTED Final   Influenza B NOT DETECTED NOT DETECTED Final   Parainfluenza Virus 1 NOT DETECTED NOT DETECTED Final   Parainfluenza Virus 2 NOT DETECTED NOT DETECTED Final   Parainfluenza Virus 3 NOT DETECTED NOT DETECTED Final   Parainfluenza Virus 4 NOT DETECTED NOT DETECTED Final   Respiratory Syncytial Virus NOT DETECTED NOT DETECTED Final   Bordetella pertussis NOT DETECTED NOT DETECTED Final   Bordetella Parapertussis NOT DETECTED NOT DETECTED Final   Chlamydophila pneumoniae NOT DETECTED NOT DETECTED Final   Mycoplasma pneumoniae NOT DETECTED NOT DETECTED Final    Comment: Performed at [facility], [address] [place], [place] [address]    Coagulation Studies: No results for input(s): "LABPROT", "INR" in the last 72 hours.   Urinalysis: No results for input(s): "COLORURINE", "LABSPEC", "PHURINE", "GLUCOSEU", "HGBUR", "BILIRUBINUR", "KETONESUR", "PROTEINUR", "UROBILINOGEN", "NITRITE", "LEUKOCYTESUR" in the last 72 hours.  Invalid input(s): "APPERANCEUR"     Imaging: No results found.    Medications:    cefTRIAXone (ROCEPHIN)  IV 2 g (01/30/24 0943)    vitamin C  500 mg Oral BID   aspirin  81 mg Oral Daily   buPROPion  150 mg Oral Daily   Chlorhexidine Gluconate Cloth  6 each Topical Daily   citalopram  20 mg Oral Daily   feeding supplement (GLUCERNA SHAKE)  237 mL Oral TID BM   Gerhardt's butt cream   Topical BID   hydrocortisone sod succinate (SOLU-CORTEF) inj  50 mg Intravenous Q12H   insulin aspart  0-20 Units Subcutaneous Q4H   insulin aspart  5 Units Subcutaneous TID WC   insulin glargine  26 Units Subcutaneous QHS    lactulose  20 g Oral BID   levETIRAcetam  750 mg Oral BID   midodrine  10 mg Oral TID WC   mirtazapine  15 mg Oral QHS   multivitamin with minerals  1 tablet Oral Daily  mupirocin ointment  1 Application Nasal BID   pantoprazole (PROTONIX) IV  40 mg Intravenous Q12H   rifaximin  550 mg Oral BID   rosuvastatin  20 mg Oral Daily   sodium chloride flush  10-40 mL Intracatheter Q12H   thiamine  100 mg Oral Daily   zinc sulfate (50mg  elemental zinc)  220 mg Oral Daily   acetaminophen, docusate sodium, fentaNYL (SUBLIMAZE) injection, mouth rinse, polyethylene glycol, polyvinyl alcohol, sodium chloride flush  Assessment/ Plan:  58 y.o. female with past medical history of anemia, depression, diabetes mellitus type 2, liver cirrhosis, GERD, hypertension, obstructive sleep apnea who was admitted on 01/26/2024 for acute hyponatremia, ventricular tachycardia, septic shock, altered mental status.  1.  Severe hyponatremia.  Serum sodium was 115 upon arrival.  Urine sodium was less than 10.  Patient was initially started on 3% saline and stopped within a short period of time.    Sodium has improved to 132.  Patient encouraged to maintain oral intake.  2.  Acute kidney injury/hyperkalemia.  Acute kidney injury likely secondary to hypotension with development of ATN.  CT abdomen pelvis negative for obstruction.  Renal function has returned to baseline..  3.  Acute metabolic acidosis.  Serum bicarbonate 26.  Corrected.     LOS: 4 Perl Kerney 3/1/202511:10 AM

## 2024-01-30 NOTE — Evaluation (Signed)
 Occupational Therapy Evaluation Patient Details Name: Abigail Molina MRN: [id number] DOB: 17-Jun-1966 Today's Date: 01/30/2024   History of Present Illness   Patient is a 58 year old female found unresponsive, was intubated. Found to have septic shock, NSTEMI, acute metabolic encephalopathy. History of hypertension, hyperlipidemia, type 2 diabetes, anemia, endometrial cancer, OSA, GERD, NASH.     Clinical Impressions Patient agreeable to OT evaluation. Pt presents to acute OT demonstrating impaired ADL performance and functional mobility 2/2 decreased endurance, balance, and strength (See OT problem list for additional functional deficits). During evaluation, she required Mod A for supine<>sit, Min A for STS from BSC/EOB, CGA-Min A for step pivot transfer to [facility], and CGA for side steps along EOB using RW. She also required Max A for LB dressing and posterior hygiene. Pt would benefit from skilled OT services to address noted impairments and functional limitations (see below for any additional details) in order to maximize safety and independence while minimizing falls risk and caregiver burden. Anticipate the need for follow up OT services upon acute hospital DC.     If plan is discharge home, recommend the following:   A little help with walking and/or transfers;A lot of help with bathing/dressing/bathroom;Assistance with cooking/housework;Assist for transportation;Help with stairs or ramp for entrance     Functional Status Assessment   Patient has had a recent decline in their functional status and demonstrates the ability to make significant improvements in function in a reasonable and predictable amount of time.     Equipment Recommendations   Other (comment) (defer)     Recommendations for Other Services         Precautions/Restrictions   Precautions Precautions: Fall Restrictions Weight Bearing Restrictions Per Provider Order: No     Mobility Bed Mobility Overal bed  mobility: Needs Assistance Bed Mobility: Sit to Supine, Supine to Sit     Supine to sit: Mod assist Sit to supine: Mod assist   General bed mobility comments: assistance for LE and trunk support    Transfers Overall transfer level: Needs assistance Equipment used: Rolling walker (2 wheels) Transfers: Sit to/from Stand, Bed to chair/wheelchair/BSC Sit to Stand: Min assist (from EOB)     Step pivot transfers: Contact guard assist, Min assist            Balance Overall balance assessment: Needs assistance Sitting-balance support: Bilateral upper extremity supported Sitting balance-Leahy Scale: Good     Standing balance support: Bilateral upper extremity supported, During functional activity, Reliant on assistive device for balance Standing balance-Leahy Scale: Good                             ADL either performed or assessed with clinical judgement   ADL Overall ADL's : Needs assistance/impaired                     Lower Body Dressing: Maximal assistance;Bed level Lower Body Dressing Details (indicate cue type and reason): to don B socks Toilet Transfer: Minimal assistance;Contact guard assist;BSC/3in1;Rolling walker (2 wheels)   Toileting- Clothing Manipulation and Hygiene: Maximal assistance;Sit to/from stand Toileting - Clothing Manipulation Details (indicate cue type and reason): for posterior hygiene after using BSC, pt endorsed needing to have a BM but unsuccessful (RN aware)     Functional mobility during ADLs: Contact guard assist;Rolling walker (2 wheels) (Pt took side steps to the L toward [facility] with CGA + RW. OT managing lines/leads.)  Vision Baseline Vision/History: 1 Wears glasses Patient Visual Report: No change from baseline       Perception         Praxis         Pertinent Vitals/Pain Pain Assessment Pain Assessment: Faces Faces Pain Scale: Hurts little more Pain Location: buttocks, R rib area Pain Descriptors /  Indicators: Discomfort, Sore Pain Intervention(s): Limited activity within patient's tolerance, Monitored during session, Repositioned     Extremity/Trunk Assessment Upper Extremity Assessment Upper Extremity Assessment: Generalized weakness   Lower Extremity Assessment Lower Extremity Assessment: Generalized weakness       Communication Communication Communication: No apparent difficulties   Cognition Arousal: Alert Behavior During Therapy: WFL for tasks assessed/performed             Following commands: Intact       Cueing  General Comments   Cueing Techniques: Verbal cues      Exercises Other Exercises Other Exercises: OT provided education re: role of OT, OT POC, post acute recs, sitting up for all meals, EOB/OOB mobility with assistance, home/fall safety.     Shoulder Instructions      Home Living Family/patient expects to be discharged to:: Private residence Living Arrangements: Other relatives (cousin) Available Help at Discharge: Family;Available PRN/intermittently Type of Home: Mobile home Home Access: Stairs to enter Entrance Stairs-Number of Steps: 6 Entrance Stairs-Rails: Right;Left Home Layout: One level     Bathroom Shower/Tub: Producer, television/film/video: Standard     Home Equipment: Agricultural consultant (2 wheels);BSC/3in1;Hand held shower head          Prior Functioning/Environment Prior Level of Function : Independent/Modified Independent             Mobility Comments: Mod I with RW ADLs Comments: IND for ADLs, has been using BSC in living room for toileting (cousin empties for her), cousin drives.    OT Problem List: Decreased strength;Decreased activity tolerance;Impaired balance (sitting and/or standing);Pain;Obesity   OT Treatment/Interventions: Self-care/ADL training;Therapeutic exercise;Energy conservation;DME and/or AE instruction;Therapeutic activities;Patient/family education;Balance training      OT  Goals(Current goals can be found in the care plan section)   Acute Rehab OT Goals Patient Stated Goal: return to PLOF OT Goal Formulation: With patient Time For Goal Achievement: 02/13/24 Potential to Achieve Goals: Good ADL Goals Pt Will Perform Lower Body Dressing: with modified independence;sitting/lateral leans;sit to/from stand Pt Will Transfer to Toilet: with modified independence;ambulating;bedside commode Pt Will Perform Toileting - Clothing Manipulation and hygiene: with modified independence;sitting/lateral leans;sit to/from stand   OT Frequency:  Min 1X/week    Co-evaluation              AM-PAC OT "6 Clicks" Daily Activity     Outcome Measure Help from another person eating meals?: None Help from another person taking care of personal grooming?: A Little Help from another person toileting, which includes using toliet, bedpan, or urinal?: A Lot Help from another person bathing (including washing, rinsing, drying)?: A Lot Help from another person to put on and taking off regular upper body clothing?: A Little Help from another person to put on and taking off regular lower body clothing?: A Lot 6 Click Score: 16   End of Session Equipment Utilized During Treatment: Gait belt;Rolling walker (2 wheels) Nurse Communication: Mobility status  Activity Tolerance: Patient tolerated treatment well Patient left: in bed;with call bell/phone within reach;with bed alarm set  OT Visit Diagnosis: Unsteadiness on feet (R26.81);Muscle weakness (generalized) (M62.81);Pain  Time: 2841-3244 OT Time Calculation (min): 40 min Charges:  OT General Charges $OT Visit: 1 Visit OT Evaluation $OT Eval Moderate Complexity: 1 Mod OT Treatments $Self Care/Home Management : 8-22 mins  [facility] MS, OTR/L ascom [phone]  01/30/24, 5:56 PM

## 2024-01-30 NOTE — Progress Notes (Signed)
 NAME:  Abigail Molina, MRN:  [id number], DOB:  06-Nov-1966, LOS: 4 ADMISSION DATE:  01/26/2024, CONSULTATION DATE: 01/26/2024 REFERRING MD: Dr. Roxan Hockey , CHIEF COMPLAINT: Unresponsiveness    Brief Pt Description / Synopsis:  58 y.o. female admitted with Acute Metabolic Encephalopathy and Severe Sepsis with Septic Shock in the setting of Klebsiella Oxytoca Bacteremia due to UTI and suspected pneumonia, along with NSTEMI, Acute Kidney Injury and Hyponatremia. Required intubation and mechanical ventilation, now extubated.  History of Present Illness:  This is a 58 yo female who presented to [facility] ER on 02/25 via EMS from home after being found unresponsive at her home by family.  It was reported her last known well time was 2100 on 02/24.  When EMS arrived on the scene pt found to be hypotensive bp 56/29 and febrile axillary temp 100.2 F.    ED Course  In the ER pt remained hypotensive and unresponsive requiring mechanical intubation.  She met sepsis protocol and received: 3L NS bolus/azithromycin/ceftriaxone.  She remained hypotensive requiring levophed gtt.  Significant lab results were: Na+ 115/K+ 6.2/chloride 86/CO2 17/glucose 126/BUN 67/creatinine 2.41/calcium 8.6/alk phos 204/albumin 1.9/AST 71/lactic acid 6.2/wbc 40.9/hgb 7.2/PT 18.8/INR 1.6.  UA results: small leukocytes/glucose >= 500/rare bacteria.  CT Head/Cervical Spine negative for acute abnormality.  CT Abd/Pelvis negative for acute abdominopelvic process, but revealed cirrhosis with portal hypertension and splenomegaly; trace left pleural effusion; and early pneumonia vs. atelectasis.  Due to severe hyponatremia pt received a 100 ml hypertonic saline bolus.  PCCM team contacted for ICU admission.    01/29/24- patient continues to improve she is conversive and reports feeling better. She is being optimized today for TRH.  Was able to wean off vasopressors 01/30/24- off vasopressors, mentation markedly better. On lactulose with stooling and good  UOP. Refined medications during multidisciplinary rounds.  Needs PT/OT  Pertinent  Medical History  Anemia  Anxiety  Arthritis  Endometrial Cancer  Chronic Pain  Cirrhosis of Liver  Depression  Type II Diabetes Mellitus  NASH  GERD Glaucoma  HLD HTN  Meningioma OSA  Overactive Bladder  Psoriasis   Micro Data:  02/25: MRSA PCR>>positive  02/25: Covid & Resp panel by RT-PCR>>negative  02/25: Blood x2>> Klebsiella oxytoca 02/25: Urine>> 02/25: Tracheal aspirate>>  Antimicrobials:   Anti-infectives (From admission, onward)    Start     Dose/Rate Route Frequency Ordered Stop   01/27/24 2200  rifaximin (XIFAXAN) tablet 550 mg        550 mg Oral 2 times daily 01/27/24 1458     01/27/24 1000  cefTRIAXone (ROCEPHIN) 2 g in sodium chloride 0.9 % 100 mL IVPB        2 g 200 mL/hr over 30 Minutes Intravenous Every 24 hours 01/26/24 1316     01/27/24 1000  doxycycline (VIBRAMYCIN) 100 mg in sodium chloride 0.9 % 250 mL IVPB  Status:  Discontinued        100 mg 125 mL/hr over 120 Minutes Intravenous Every 12 hours 01/26/24 1341 01/27/24 0910   01/27/24 0845  azithromycin (ZITHROMAX) 500 mg in sodium chloride 0.9 % 250 mL IVPB  Status:  Discontinued        500 mg 250 mL/hr over 60 Minutes Intravenous Every 24 hours 01/26/24 1316 01/26/24 1341   01/26/24 1615  vancomycin (VANCOREADY) IVPB 1750 mg/350 mL        1,750 mg 175 mL/hr over 120 Minutes Intravenous  Once 01/26/24 1523 01/26/24 1928   01/26/24 1610  vancomycin variable dose per  unstable renal function (pharmacist dosing)  Status:  Discontinued         Does not apply See admin instructions 01/26/24 1610 01/27/24 0910   01/26/24 1430  rifaximin (XIFAXAN) tablet 550 mg  Status:  Discontinued        550 mg Per Tube 2 times daily 01/26/24 1339 01/27/24 1458   01/26/24 0845  cefTRIAXone (ROCEPHIN) 2 g in sodium chloride 0.9 % 100 mL IVPB        2 g 200 mL/hr over 30 Minutes Intravenous Once 01/26/24 0830 01/26/24 0906    01/26/24 0845  azithromycin (ZITHROMAX) 500 mg in sodium chloride 0.9 % 250 mL IVPB        500 mg 250 mL/hr over 60 Minutes Intravenous  Once 01/26/24 0830 01/26/24 1012       Significant Hospital Events: Including procedures, antibiotic start and stop dates in addition to other pertinent events   02/25: Admitted with acute toxic metabolic encephalopathy, acute kidney injury, hyperkalemia, anion gap metabolic acidosis, hypotension, intermittent wide complex tachycardia, and intermittent bradycardia requiring mechanical intubation and levophed gtt.  Nephrology consulted. 02/26: Extubated.  Remains on Levophed and Vasopressin, check CVP to assess need for further fluid requirements. Start stress dose steroids.  Blood cultures + Klebsiella Oxytoca, deescalate ABX to Ceftriaxone, consult ID. AKI and metabolic acidosis slowly improving.  Consult Cardiology due to concern for NSTEMI (HS Troponin peaked at 4087), restarting Heparin gtt.   Objective   Blood pressure 103/62, pulse 84, temperature 97.9 F (36.6 C), temperature source Oral, resp. rate 14, height 5\' 4"  (1.626 m), weight 95.3 kg, last menstrual period 10/15/2016, SpO2 100%.        Intake/Output Summary (Last 24 hours) at 01/30/2024 1013 Last data filed at 01/30/2024 0600 Gross per 24 hour  Intake 1022.46 ml  Output 2125 ml  Net -1102.54 ml   Filed Weights   01/28/24 0400 01/29/24 0431 01/30/24 0400  Weight: 90.3 kg 90.6 kg 95.3 kg    Examination: General: Acutely on chronically-ill appearing female, just extubated, in NAD  HENT: Atraumatic, normocephalic, neck Supple, no JVD  Lungs: Clear diminished throughout, even, non labored  Cardiovascular: Regular rate and rhythm, s1s2, no m/r/g, 2+ radial/1+ distal pulses, no edema   Abdomen: +BS x4, obese mildly distended Extremities: Normal bulk and tone, no deformities Skin: Stage II sacral spine pressure ulcer with erythema with abrasions present on buttocks present on  admission   Neuro: Awake and alert, oriented x3, moves all extremities to commands, no focal deficits noted, speech clear, PERRL GU: Indwelling foley catheter draining yellow urine   Assessment & Plan:   #Acute toxic metabolic encephalopathy  #Mechanical intubation pain/discomfort ~ NOW EXTUBATED Hx: Meningioma and seizures  -CT Head negative for acute intracranial abnormality, age indeterminate left cerebellar infarct -EEG suggestive of severe diffuse encephalopathy, no seizures -Treatment of metabolic derangements as outlined above -Provide supportive care -Promote normal sleep/wake cycle and family presence -Avoid sedating medications as able -Continue outpatient Keppra  #Septic shock -present on admission- due to Klebsiella bacteremia and UTI #NSTEMI #Intermittent wide complex tachycardia  #Intermittent vtach #Intermittent bradycardia  Hx: HTN and HLD  Echocardiogram 01/26/24: LVEF >55%, normal diastolic parameters, RV systolic function is normal, RV size normal -Continuous cardiac monitoring -Maintain MAP >65 -IV fluids ~ check CVP to assess need for further boluses ~ CVP is 8, hold off on further bolus -Vasopressors as needed to maintain MAP goal  -Start Stress Dose Steroids -Trend lactic acid until normalized -HS Troponin peaked  at [phone] -Consult Cardiology, appreciate input ~ Recommendations: initially Heparin held yesterday due to low Hgb, but has been stable today, will resume Heparin and monitor and treat medically -Continue Aspirin 81 mg daily, start Crestor 20 mg daily -Hold outpatient antihypertensives and diuretic therapy   #Severe Sepsis- PRESENT ON ADMISSION - DUE TO BACTEREMIA WITH KLEBSIELLA #Klebsiella Oxytoca BACTEREMIA #Suspected Pneumonia and UTI -Monitor fever curve -Trend WBC's & Procalcitonin -Follow cultures as above -Continue empiric Ceftriaxone pending cultures & sensitivities -Will consult ID to help guide further antimicrobials, appreciate  input  #Acute respiratory failure  #Possible pneumonia  Hx: OSA  EXTUBATED 2/26 -Supplemental O2 as needed to maintain O2 sats >92% -BiPAP if needed -Follow intermittent Chest X-ray & ABG as needed -Bronchodilators & Pulmicort nebs -IV Steroids -ABX as above -Pulmonary toilet as able  #Severe hyponatremia ~ IMPROVING #Acute kidney injury secondary to ATN  ~ IMPROVING #Non anion gap metabolic acidosis ~ IMPROVING #Hyperkalemia ~ RESOLVED #Lactic acidosis  -Monitor I&O's / urinary output -Follow BMP -Ensure adequate renal perfusion -Avoid nephrotoxic agents as able -Replace electrolytes as indicated ~ Pharmacy following for assistance with electrolyte replacement -Continue Bicarb gtt -Nephrology following, appreciate input ~ no indication for HD currently   #NASH cirrhosis  #Portal hypertension  Hx: Esophageal varices  - Trend hepatic function panel and coags  - Avoid hepatotoxic medications as able  - Continue outpatient lactulose and rifaximin   #Anemia with no signs of obvious bleeding  - Trend CBC  - Monitor for s/sx of bleeding  - Transfuse for hgb <7  #Type II diabetes mellitus  - CBG's q4hrs  - SSI  - Target CBG's 140 to 180's - Follow hypo/hyperglycemic protocol   #Stage II pressure ulcer present on admission  - Wound care consulted appreciate input  - Turn q2hrs     Best Practice (right click and "Reselect all SmartList Selections" daily)   Diet/type: NPO until passes bedside swallow evaluation DVT prophylaxis start Heparin gtt for ACS Pressure ulcer(s): present on admission  GI prophylaxis: PPI Lines: Central line and is still needed Foley:  Yes, and it is still needed Code Status:  full code Last date of multidisciplinary goals of care discussion [2/26]   Labs   CBC: Recent Labs  Lab 01/26/24 0834 01/26/24 2038 01/27/24 0414 01/28/24 0454 01/28/24 1250 01/28/24 1447 01/28/24 2257 01/29/24 0334 01/30/24 0426  WBC 40.9* 58.6* 50.9*  25.6*  --   --   --  9.4 8.7  NEUTROABS 36.1* 53.3* 43.5*  --   --   --   --   --   --   HGB 7.2* 8.0* 8.2* 6.6* 6.8* 6.8* 8.1* 8.6* 7.8*  HCT 21.9* 25.2* 24.9* 20.5* 21.0* 21.0* 24.5* 26.3* 24.5*  MCV 80.8 82.6 81.4 79.5*  --   --   --  80.4 80.9  PLT 185 385 416* 243  --   --   --  159 124*    Basic Metabolic Panel: Recent Labs  Lab 01/26/24 1153 01/26/24 1213 01/27/24 0010 01/27/24 0414 01/28/24 0454 01/28/24 1008 01/29/24 0334 01/29/24 0822 01/29/24 1218 01/29/24 1418 01/29/24 1857 01/30/24 0426  NA  --    < > 122* 125* 121*   < > 128* 128* 130* 128* 132* 132*  K  --    < > 5.4* 4.7 4.2  --  3.6  --   --   --   --  4.0  CL  --    < > 90* 93* 87*  --  93*  --   --   --   --  98  CO2  --    < > 19* 21* 23  --  23  --   --   --   --  26  GLUCOSE  --    < > 327* 151* 151*  --  228*  --   --   --   --  186*  BUN  --    < > 57* 54* 49*  --  51*  --   --   --   --  44*  CREATININE  --    < > 1.69* 1.47* 1.19*  --  1.00  --   --   --   --  0.74  CALCIUM  --    < > 8.9 8.9 8.5*  --  8.8*  --   --   --   --  8.8*  MG 2.1  --   --  2.3 2.3  --  2.6*  --   --   --   --  2.6*  PHOS 5.1*  --   --  3.3 2.3*  --  2.8  --   --   --   --  2.1*   < > = values in this interval not displayed.   GFR: Estimated Creatinine Clearance: 84.7 mL/min (by C-G formula based on SCr of 0.74 mg/dL). Recent Labs  Lab 01/26/24 1213 01/26/24 1752 01/27/24 0414 01/27/24 1130 01/27/24 1415 01/28/24 0454 01/28/24 1447 01/28/24 1644 01/29/24 0334 01/30/24 0426  PROCALCITON 17.70  --  11.39  --   --  5.14  --   --   --   --   WBC  --    < > 50.9*  --   --  25.6*  --   --  9.4 8.7  LATICACIDVEN  --    < > 3.5*   < > 2.4* 2.5* 2.0* 2.5*  --   --    < > = values in this interval not displayed.    Liver Function Tests: Recent Labs  Lab 01/26/24 0834 01/28/24 0454 01/29/24 0334 01/30/24 0426  AST 71*  --   --   --   ALT 26  --   --   --   ALKPHOS 204*  --   --   --   BILITOT 5.1*  --   --   --    PROT 6.4*  --   --   --   ALBUMIN 1.9* 2.5* 2.7* 2.4*   No results for input(s): "LIPASE", "AMYLASE" in the last 168 hours. Recent Labs  Lab 01/26/24 0834 01/26/24 1213  AMMONIA 52* 56*    ABG    Component Value Date/Time   PHART 7.38 01/27/2024 0408   PCO2ART 40 01/27/2024 0408   PO2ART 113 (H) 01/27/2024 0408   HCO3 23.7 01/27/2024 0408   ACIDBASEDEF 1.3 01/27/2024 0408   O2SAT 99.4 01/27/2024 0408     Coagulation Profile: Recent Labs  Lab 01/26/24 0834  INR 1.6*    Cardiac Enzymes: No results for input(s): "CKTOTAL", "CKMB", "CKMBINDEX", "TROPONINI" in the last 168 hours.  HbA1C: Hemoglobin A1C  Date/Time Value Ref Range Status  11/04/2022 12:00 AM 7.8  Final    Comment:    care everywhere  12/06/2015 12:00 AM 5.6  Final   Hgb A1c MFr Bld  Date/Time Value Ref Range Status  01/26/2024 02:15 PM 8.5 (H) 4.8 - 5.6 % Final  Comment:    (NOTE)         Prediabetes: 5.7 - 6.4         Diabetes: >6.4         Glycemic control for adults with diabetes: <7.0   11/17/2018 09:42 AM 9.0 (H) 4.8 - 5.6 % Final    Comment:             Prediabetes: 5.7 - 6.4          Diabetes: >6.4          Glycemic control for adults with diabetes: <7.0     CBG: Recent Labs  Lab 01/29/24 1931 01/29/24 2200 01/29/24 2357 01/30/24 0425 01/30/24 0748  GLUCAP 203* 182* 196* 173* 152*    Review of Systems:   Positives in BOLD: Currently denies all complaints Gen: Denies fever, chills, weight change, fatigue, night sweats HEENT: Denies blurred vision, double vision, hearing loss, tinnitus, sinus congestion, rhinorrhea, sore throat, neck stiffness, dysphagia PULM: Denies shortness of breath, cough, sputum production, hemoptysis, wheezing CV: Denies chest pain, edema, orthopnea, paroxysmal nocturnal dyspnea, palpitations GI: Denies abdominal pain, nausea, vomiting, diarrhea, hematochezia, melena, constipation, change in bowel habits GU: Denies dysuria, hematuria, polyuria,  oliguria, urethral discharge Endocrine: Denies hot or cold intolerance, polyuria, polyphagia or appetite change Derm: Denies rash, dry skin, scaling or peeling skin change Heme: Denies easy bruising, bleeding, bleeding gums Neuro: Denies headache, numbness, weakness, slurred speech, loss of memory or consciousness   Past Medical History:  She,  has a past medical history of Anemia, Anxiety, Arthritis, Cancer (HCC), Chronic pain, Cirrhosis of liver (HCC), Depression, Diabetes mellitus without complication (HCC), Fatty liver disease, nonalcoholic (2021), GERD (gastroesophageal reflux disease), Glaucoma, Hyperlipidemia, Hypertension, Meningioma (HCC), OSA (obstructive sleep apnea) (05/08/2023), Overactive bladder, Pharmacologic therapy (11/02/2023), and Psoriasis (2015).   Surgical History:   Past Surgical History:  Procedure Laterality Date   ABDOMINAL HYSTERECTOMY  2007   CHOLECYSTECTOMY  2000     Social History:   reports that she has never smoked. She has never used smokeless tobacco. She reports that she does not currently use alcohol. She reports that she does not use drugs.   Family History:  Her family history includes Alcohol abuse in her cousin and maternal grandmother; Bipolar disorder in her cousin; Depression in her mother; Diabetes in her mother; Heart disease in her maternal grandfather; Hypertension in her mother; Obesity in her mother; Stroke in her paternal grandfather; Vision loss in her mother.   Allergies No Known Allergies   Home Medications  Prior to Admission medications   Medication Sig Start Date End Date Taking? Authorizing Provider  aspirin 81 MG tablet Take 81 mg by mouth daily.   Yes [provider]  Baclofen 5 MG TABS Take 2 tablets by mouth at bedtime.   Yes [provider]  buPROPion (WELLBUTRIN XL) 150 MG 24 hr tablet Take 1 tablet (150 mg total) by mouth daily. 11/16/23 03/15/24 Yes Hisada, Barbee Cough, MD  canagliflozin [facility]) 300 MG TABS  tablet Take by mouth. 02/17/22  Yes [provider]  carvedilol (COREG) 6.25 MG tablet Take 6.25 mg by mouth 2 (two) times daily. 06/19/22  Yes [provider]  citalopram (CELEXA) 20 MG tablet Take 20 mg by mouth daily.   Yes [provider]  Continuous Glucose Sensor (FREESTYLE LIBRE 3 SENSOR) MISC by Does not apply route daily.   Yes [provider]  COSENTYX SENSOREADY, 300 MG, 150 MG/ML SOAJ Inject into the skin.  Yes [provider]  Ferrous Sulfate (SLOW FE) 142 (45 Fe) MG TBCR Take by mouth. 09/12/21  Yes [provider]  furosemide (LASIX) 40 MG tablet Take 40 mg by mouth 2 (two) times daily. 09/25/23  Yes [provider]  gabapentin (NEURONTIN) 300 MG capsule Take by mouth once. 06/02/23 06/01/24 Yes [provider]  insulin lispro (HUMALOG) 100 UNIT/ML KwikPen Inject into the skin.   Yes [provider]  lactulose (CHRONULAC) 10 GM/15ML solution SMARTSIG:Milliliter(s) By Mouth 06/19/22  Yes [provider]  LANTUS SOLOSTAR 100 UNIT/ML Solostar Pen Inject 66 Units into the skin daily. 11/25/22 01/26/24 Yes [provider]  levETIRAcetam (KEPPRA) 500 MG tablet Take 750 mg by mouth 2 (two) times daily. 01/04/24 07/02/24 Yes [provider]  MAGNESIUM-OXIDE 400 (240 Mg) MG tablet Take 2 tablets by mouth 2 (two) times daily. 10/14/23  Yes [provider]  metFORMIN (GLUCOPHAGE-XR) 500 MG 24 hr tablet Take 1,000 mg by mouth 2 (two) times daily.   Yes [provider]  mirtazapine (REMERON) 15 MG tablet Take 1 tablet (15 mg total) by mouth at bedtime. 11/28/23 01/26/24 Yes Hisada, Barbee Cough, MD  ondansetron (ZOFRAN-ODT) 4 MG disintegrating tablet Dissolve 1 tablet (4 mg total) in the mouth every twelve (12) hours as needed for nausea. 10/05/23 10/04/24 Yes [provider]  pantoprazole (PROTONIX) 40 MG tablet TAKE ONE TABLET BY MOUTH EVERY DAY 10/24/19  Yes McGowan, Carollee Herter A, PA-C   promethazine (PHENERGAN) 12.5 MG tablet Take by mouth. 05/23/22  Yes [provider]  scopolamine (TRANSDERM-SCOP) 1 MG/3DAYS Place 1 patch onto the skin every 3 (three) days. 12/07/23  Yes [provider]  spironolactone (ALDACTONE) 100 MG tablet Take 100 mg by mouth 2 (two) times daily.   Yes [provider]  XIFAXAN 550 MG TABS tablet Take 550 mg by mouth 2 (two) times daily.   Yes [provider]  zolpidem (AMBIEN) 5 MG tablet Take 1 tablet (5 mg total) by mouth at bedtime as needed for sleep. 11/01/23 01/26/24 Yes Neysa Hotter, MD  Accu-Chek Softclix Lancets lancets SMARTSIG:Topical    [provider]  BD PEN NEEDLE NANO 2ND GEN 32G X 4 MM MISC USE 1 NIGHTLY 09/28/23   [provider]  chlorhexidine (PERIDEX) 0.12 % solution SMARTSIG:0.5 Capful(s) By Mouth Morning-Evening Patient not taking: Reported on 01/26/2024 10/24/23   [provider]  clotrimazole (MYCELEX) 10 MG troche Take 10 mg by mouth 5 (five) times daily. Patient not taking: Reported on 01/26/2024 08/20/23   [provider]  Continuous Glucose Receiver (DEXCOM G7 RECEIVER) DEVI USE AS INSTRUCTED WITH G7 SENSORS Patient not taking: Reported on 01/26/2024 09/04/23   [provider]  Continuous Glucose Sensor (DEXCOM G7 SENSOR) MISC USE TO MONITOR BLOOD GLUCOSE LEVELS CONTINUOUSLY. CHANGE SENSOR EVERY 10 DAYS. Patient not taking: Reported on 01/26/2024    [provider]  Dulaglutide (TRULICITY) 1.5 MG/0.5ML SOPN Inject 1.5 mg into the skin once a week. Thursdays Patient not taking: Reported on 01/26/2024    [provider]  insulin glargine-yfgn (SEMGLEE, YFGN,) 100 UNIT/ML Pen INJECT 50 UNITS UNDER THE SKIN NIGHTLY AS DIRECTED BY YOUR DOCTOR AROUND SAME TIME EVERY DAY. TOTAL DAILY DOSE NOT TO EXCEED 50 UNITS Patient not taking: Reported on 01/26/2024 10/06/23   [provider]  Insulin Pen Needle 32G X 6 MM MISC 1 Syringe by Does not  apply route. Use with Victoza.    [provider]  naltrexone (DEPADE) 50 MG  tablet Take 50 mg by mouth daily.    [provider]  Secukinumab (COSENTYX SENSOREADY PEN) 150 MG/ML SOAJ Inject the contents of 2 pens (300 mg total) under the skin once a week at weeks 0, 1, 2, 3, and 4. THEN inject the contents of 2 pens (300 mg total) every 4 weeks thereafter. Patient not taking: Reported on 01/26/2024 09/30/23   [provider]  Sharps Container (BD SHARPS COLLECTOR) MISC Use as directed to dispose of Cosentyx pens. 10/05/23   [provider]  simvastatin (ZOCOR) 20 MG tablet TAKE ONE TABLET BY MOUTH EVERY EVENING 10/24/19   McGowan, Wellington [place], PA-C      Vida Rigger, M.D.  Pulmonary & Critical Care Medicine

## 2024-01-30 NOTE — Progress Notes (Signed)
 A&O patient transferred to bed 217, report called to [place], [place]

## 2024-01-30 NOTE — Plan of Care (Signed)

## 2024-01-30 NOTE — Progress Notes (Signed)
 Pt set up for lunch; arrived about 15 minutes ago, thus delaying noontime dose of insulin with meal coverage. CBG rechecked.

## 2024-01-31 DIAGNOSIS — R6521 Severe sepsis with septic shock: Secondary | ICD-10-CM | POA: Diagnosis not present

## 2024-01-31 DIAGNOSIS — A419 Sepsis, unspecified organism: Secondary | ICD-10-CM | POA: Diagnosis not present

## 2024-01-31 LAB — CBC
HCT: 27.7 % — ABNORMAL LOW (ref 36.0–46.0)
Hemoglobin: 8.8 g/dL — ABNORMAL LOW (ref 12.0–15.0)
MCH: 26.3 pg (ref 26.0–34.0)
MCHC: 31.8 g/dL (ref 30.0–36.0)
MCV: 82.7 fL (ref 80.0–100.0)
Platelets: 148 10*3/uL — ABNORMAL LOW (ref 150–400)
RBC: 3.35 MIL/uL — ABNORMAL LOW (ref 3.87–5.11)
RDW: 18.7 % — ABNORMAL HIGH (ref 11.5–15.5)
WBC: 8.8 10*3/uL (ref 4.0–10.5)
nRBC: 0 % (ref 0.0–0.2)

## 2024-01-31 LAB — GLUCOSE, CAPILLARY
Glucose-Capillary: 99 mg/dL (ref 70–99)
Glucose-Capillary: 82 mg/dL (ref 70–99)
Glucose-Capillary: 161 mg/dL — ABNORMAL HIGH (ref 70–99)
Glucose-Capillary: 164 mg/dL — ABNORMAL HIGH (ref 70–99)
Glucose-Capillary: 169 mg/dL — ABNORMAL HIGH (ref 70–99)

## 2024-01-31 LAB — RENAL FUNCTION PANEL
Albumin: 2.3 g/dL — ABNORMAL LOW (ref 3.5–5.0)
Anion gap: 8 (ref 5–15)
BUN: 33 mg/dL — ABNORMAL HIGH (ref 6–20)
CO2: 27 mmol/L (ref 22–32)
Calcium: 9.3 mg/dL (ref 8.9–10.3)
Chloride: 99 mmol/L (ref 98–111)
Creatinine, Ser: 0.84 mg/dL (ref 0.44–1.00)
GFR, Estimated: >60 mL/min (ref >60)
Glucose, Bld: 84 mg/dL (ref 70–99)
Phosphorus: 1.4 mg/dL — ABNORMAL LOW (ref 2.5–4.6)
Potassium: 3.9 mmol/L (ref 3.5–5.1)
Sodium: 134 mmol/L — ABNORMAL LOW (ref 135–145)

## 2024-01-31 MED ORDER — POTASSIUM PHOSPHATE MONOBASIC 500 MG PO TABS
500.0000 mg | ORAL_TABLET | ORAL | Status: AC
  Administered 2024-01-31 – 2024-02-01 (×4): 500 mg via ORAL
  Filled 2024-01-31 (×4): qty 1

## 2024-01-31 MED ORDER — K PHOS MONO-SOD PHOS DI & MONO 155-852-130 MG PO TABS
500.0000 mg | ORAL_TABLET | ORAL | Status: DC
  Filled 2024-01-31 (×2): qty 2

## 2024-01-31 NOTE — Progress Notes (Signed)
 Progress Note   Patient: Abigail Molina ZOX:[id number] DOB: [birth year] DOA: 01/26/2024     5 DOS: the patient was seen and examined on 01/31/2024     Brief hospital course: 58 y.o. female admitted with Acute Metabolic Encephalopathy and Severe Sepsis with Septic Shock in the setting of Klebsiella Oxytoca Bacteremia due to UTI and suspected pneumonia, along with NSTEMI, Acute Kidney Injury and Hyponatremia. Required intubation and mechanical ventilation, now extubated.  Nephrology both gave to AKI, cardiology on board due to concerns of NSTEMI with peak troponin 4087.   Assessment and Plan:     Acute toxic metabolic encephalopathy  #Mechanical intubation pain/discomfort  Hx: Meningioma and seizures  CT scan of the brain did not show any acute intracranial pathology Continue Keppra Monitor neurochecks closely   #Septic shock -present on admission- due to Klebsiella bacteremia and UTI #NSTEMI #Intermittent wide complex tachycardia  #Intermittent vtach #Intermittent bradycardia  Hx: HTN and HLD  Echocardiogram showed EF greater than 55% Will adjust following case discussed Continue aspirin 81 mg daily, start Crestor 20 mg daily -Hold outpatient antihypertensives and diuretic therapy    #Severe Sepsis-present on admission due to bacteremia with Klebsiella #Klebsiella Oxytoca BACTEREMIA #Suspected Pneumonia and UTI Monitor culture results Infectious disease on board Continue current antibiotics: Ceftriaxone   #Acute respiratory failure  #Possible pneumonia  Patient with history of obstructive sleep apnea Patient extubated on 01/27/2024 and transferred to [facility] on 01/30/2024 Continue breathing treatments as recommended by pulmonologist Continue steroid therapy tapering     #Severe hyponatremia ~ IMPROVING #Acute kidney injury secondary to ATN  ~ IMPROVING #Non anion gap metabolic acidosis ~ IMPROVING #Hyperkalemia ~ RESOLVED #Lactic acidosis  Continue to monitor renal  function Avoid nephrotoxic medications Renally dose all drugs Nephrologist on board we appreciate input Monitor input and output   #NASH cirrhosis  #Portal hypertension  Hx: Esophageal varices  Continue outpatient lactulose and rifaximin    #Anemia with no signs of obvious bleeding  Monitor CBC We will transfuse when hemoglobin less than 7   #Type II diabetes mellitus  Monitor glucose level closely Continue insulin therapy   #Stage II pressure ulcer present on admission  Continue wound care management     Subjective:  Patient currently doing much better Not requiring any oxygen Denies nausea vomiting abdominal pain or chest pain Is working with PT OT and have recommended rehab placement   Physical Exam: General: Acutely on chronically on intranasal oxygen HENT: Atraumatic, normocephalic, neck Supple, no JVD  Lungs: Clear diminished throughout, even, non labored  Cardiovascular: Regular rate and rhythm, S1-S2 present no Mamma Abdomen: +BS x4, obese mildly distended Extremities: Normal bulk and tone, no deformities Skin: Stage II sacral spine pressure ulcer with erythema with abrasions present on buttocks present on admission     Data Reviewed:     Latest Ref Rng & Units 01/31/2024    8:29 AM 01/30/2024    4:26 AM 01/29/2024    3:34 AM  CBC  WBC 4.0 - 10.5 K/uL 8.8  8.7  9.4   Hemoglobin 12.0 - 15.0 g/dL 8.8  7.8  8.6   Hematocrit 36.0 - 46.0 % 27.7  24.5  26.3   Platelets 150 - 400 K/uL 148  124  159        Latest Ref Rng & Units 01/31/2024    8:29 AM 01/30/2024    4:26 AM 01/29/2024    6:57 PM  BMP  Glucose 70 - 99 mg/dL 84  811  BUN 6 - 20 mg/dL 33  44    Creatinine 1.91 - 1.00 mg/dL 4.78  2.95    Sodium 621 - 145 mmol/L 134  132  132   Potassium 3.5 - 5.1 mmol/L 3.9  4.0    Chloride 98 - 111 mmol/L 99  98    CO2 22 - 32 mmol/L 27  26    Calcium 8.9 - 10.3 mg/dL 9.3  8.8      Vitals:   01/30/24 2200 01/30/24 2218 01/31/24 0250 01/31/24 0811  BP: 114/62  (!) 107/55 118/60 99/64  Pulse: 95 83 81 91  Resp: 17 17 18 20   Temp:  98.2 F (36.8 C) 97.6 F (36.4 C) 98 F (36.7 C)  TempSrc:  Oral Oral Oral  SpO2: 97% 97% 100% 96%  Weight:      Height:         Author: Loyce Dys, MD 01/31/2024 2:56 PM  For on call review [email or web address].

## 2024-01-31 NOTE — Progress Notes (Signed)
 PHARMACY CONSULT NOTE - FOLLOW UP  Pharmacy Consult for Electrolyte Monitoring and Replacement   Recent Labs: Potassium (mmol/L)  Date Value  01/31/2024 3.9   Magnesium (mg/dL)  Date Value  12/03/7251 2.6 (H)   Calcium (mg/dL)  Date Value  66/44/0347 9.3   Albumin (g/dL)  Date Value  42/59/5638 2.3 (L)  11/17/2018 3.9   Phosphorus (mg/dL)  Date Value  75/64/3329 1.4 (L)   Sodium (mmol/L)  Date Value  01/31/2024 134 (L)  11/17/2018 141   Corrected Calcium:  10.7 mg/dL   (Calcium 9.3  albumin 2.3  Assessment: Patient is a 58 year old female with a past medical history of chronic pain syndrome, cirrhosis, and diabetes who was found unresponsive on her couch this morning. Pharmacy has been consulted to monitor and replace electrolytes as needed.  Goal of Therapy:  Electrolytes WNL  Plan:  Phos 2.1 > 1.4     Scr 0.84   Will order KPhos tabs 500mg  po q4h x 4 Continue to monitor electrolytes daily  Shawnise Peterkin A, PharmD 01/31/2024 9:11 AM

## 2024-01-31 NOTE — Progress Notes (Signed)
 [facility] Cardiology  SUBJECTIVE: Patient laying in bed, denies chest pain   Vitals:   01/30/24 2200 01/30/24 2218 01/31/24 0250 01/31/24 0811  BP: 114/62 (!) 107/55 118/60 99/64  Pulse: 95 83 81 91  Resp: 17 17 18 20   Temp:  98.2 F (36.8 C) 97.6 F (36.4 C) 98 F (36.7 C)  TempSrc:  Oral Oral Oral  SpO2: 97% 97% 100% 96%  Weight:      Height:         Intake/Output Summary (Last 24 hours) at 01/31/2024 1008 Last data filed at 01/30/2024 2154 Gross per 24 hour  Intake 830 ml  Output 700 ml  Net 130 ml      PHYSICAL EXAM  General: Well developed, well nourished, in no acute distress HEENT:  Normocephalic and atramatic Neck:  No JVD.  Lungs: Clear bilaterally to auscultation and percussion. Heart: HRRR . Normal S1 and S2 without gallops or murmurs.  Abdomen: Bowel sounds are positive, abdomen soft and non-tender  Msk:  Back normal, normal gait. Normal strength and tone for age. Extremities: No clubbing, cyanosis or edema.   Neuro: Alert and oriented X 3. Psych:  Good affect, responds appropriately   LABS: Basic Metabolic Panel: Recent Labs    01/29/24 0334 01/29/24 0822 01/30/24 0426 01/31/24 0829  NA 128*   < > 132* 134*  K 3.6  --  4.0 3.9  CL 93*  --  98 99  CO2 23  --  26 27  GLUCOSE 228*  --  186* 84  BUN 51*  --  44* 33*  CREATININE 1.00  --  0.74 0.84  CALCIUM 8.8*  --  8.8* 9.3  MG 2.6*  --  2.6*  --   PHOS 2.8  --  2.1* 1.4*   < > = values in this interval not displayed.   Liver Function Tests: Recent Labs    01/30/24 0426 01/31/24 0829  ALBUMIN 2.4* 2.3*   No results for input(s): "LIPASE", "AMYLASE" in the last 72 hours. CBC: Recent Labs    01/30/24 0426 01/31/24 0829  WBC 8.7 8.8  HGB 7.8* 8.8*  HCT 24.5* 27.7*  MCV 80.9 82.7  PLT 124* 148*   Cardiac Enzymes: No results for input(s): "CKTOTAL", "CKMB", "CKMBINDEX", "TROPONINI" in the last 72 hours. BNP: Invalid input(s): "POCBNP" D-Dimer: No results for input(s): "DDIMER" in the  last 72 hours. Hemoglobin A1C: No results for input(s): "HGBA1C" in the last 72 hours. Fasting Lipid Panel: No results for input(s): "CHOL", "HDL", "LDLCALC", "TRIG", "CHOLHDL", "LDLDIRECT" in the last 72 hours. Thyroid Function Tests: No results for input(s): "TSH", "T4TOTAL", "T3FREE", "THYROIDAB" in the last 72 hours.  Invalid input(s): "FREET3" Anemia Panel: No results for input(s): "VITAMINB12", "FOLATE", "FERRITIN", "TIBC", "IRON", "RETICCTPCT" in the last 72 hours.  No results found.   Echo EF greater than 55%  TELEMETRY: Sinus rhythm with left bundle branch block:  ASSESSMENT AND PLAN:  Principal Problem:   Septic shock (HCC)    1.  NSTEMI, (179, 591, 886, 1349, 2680, 4087, 3717), the setting of septic shock, respiratory failure, acute metabolic encephalopathy, now extubated, off of vasopressors, likely and hemodynamically improved, ECG nondiagnostic, LV function normal on 2D echocardiogram 2.  Septic shock/respiratory failure/acute metabolic encephalopathy, clinically improved 3.  Anemia, hemoglobin macro 7.8 and 24.5, respectively   Recommendations   1.  Agree with current therapy 2.  Tentatively plan for cardiac catheterization with coronary arteriography to rule out significant underlying coronary artery disease on 02/02/2024  Marcina Millard, MD, PhD, [facility] 01/31/2024 10:08 AM

## 2024-02-01 ENCOUNTER — Other Ambulatory Visit: Payer: Self-pay

## 2024-02-01 DIAGNOSIS — R6521 Severe sepsis with septic shock: Secondary | ICD-10-CM | POA: Diagnosis not present

## 2024-02-01 DIAGNOSIS — A419 Sepsis, unspecified organism: Secondary | ICD-10-CM | POA: Diagnosis not present

## 2024-02-01 LAB — CBC
HCT: 26.5 % — ABNORMAL LOW (ref 36.0–46.0)
Hemoglobin: 8.4 g/dL — ABNORMAL LOW (ref 12.0–15.0)
MCH: 26 pg (ref 26.0–34.0)
MCHC: 31.7 g/dL (ref 30.0–36.0)
MCV: 82 fL (ref 80.0–100.0)
Platelets: 153 10*3/uL (ref 150–400)
RBC: 3.23 MIL/uL — ABNORMAL LOW (ref 3.87–5.11)
RDW: 19.1 % — ABNORMAL HIGH (ref 11.5–15.5)
WBC: 9.1 10*3/uL (ref 4.0–10.5)
nRBC: 0 % (ref 0.0–0.2)

## 2024-02-01 LAB — GLUCOSE, CAPILLARY
Glucose-Capillary: 134 mg/dL — ABNORMAL HIGH (ref 70–99)
Glucose-Capillary: 156 mg/dL — ABNORMAL HIGH (ref 70–99)
Glucose-Capillary: 178 mg/dL — ABNORMAL HIGH (ref 70–99)
Glucose-Capillary: 220 mg/dL — ABNORMAL HIGH (ref 70–99)
Glucose-Capillary: 284 mg/dL — ABNORMAL HIGH (ref 70–99)
Glucose-Capillary: 296 mg/dL — ABNORMAL HIGH (ref 70–99)
Glucose-Capillary: 79 mg/dL (ref 70–99)
Glucose-Capillary: 90 mg/dL (ref 70–99)

## 2024-02-01 LAB — RENAL FUNCTION PANEL
Albumin: 2.3 g/dL — ABNORMAL LOW (ref 3.5–5.0)
Anion gap: 8 (ref 5–15)
BUN: 32 mg/dL — ABNORMAL HIGH (ref 6–20)
CO2: 26 mmol/L (ref 22–32)
Calcium: 9.4 mg/dL (ref 8.9–10.3)
Chloride: 99 mmol/L (ref 98–111)
Creatinine, Ser: 0.81 mg/dL (ref 0.44–1.00)
GFR, Estimated: >60 mL/min (ref >60)
Glucose, Bld: 90 mg/dL (ref 70–99)
Phosphorus: 1.7 mg/dL — ABNORMAL LOW (ref 2.5–4.6)
Potassium: 3.9 mmol/L (ref 3.5–5.1)
Sodium: 133 mmol/L — ABNORMAL LOW (ref 135–145)

## 2024-02-01 MED ORDER — SODIUM CHLORIDE 0.9 % WEIGHT BASED INFUSION
1.0000 mL/kg/h | INTRAVENOUS | Status: DC
  Administered 2024-02-01: 1 mL/kg/h via INTRAVENOUS

## 2024-02-01 MED ORDER — POTASSIUM PHOSPHATES 15 MMOLE/5ML IV SOLN: 30.0000 mmol | Freq: Once | INTRAVENOUS | Status: DC

## 2024-02-01 MED ORDER — SULFAMETHOXAZOLE-TRIMETHOPRIM 800-160 MG PO TABS
1.0000 | ORAL_TABLET | Freq: Two times a day (BID) | ORAL | Status: DC
  Administered 2024-02-02 – 2024-02-08 (×13): 1 via ORAL
  Filled 2024-02-01 (×13): qty 1

## 2024-02-01 MED ORDER — SODIUM PHOSPHATES 45 MMOLE/15ML IV SOLN
30.0000 mmol | Freq: Once | INTRAVENOUS | Status: AC
  Administered 2024-02-01: 30 mmol via INTRAVENOUS
  Filled 2024-02-01: qty 10

## 2024-02-01 MED ORDER — SODIUM CHLORIDE 0.9% FLUSH: 3.0000 mL | INTRAVENOUS | Status: DC | PRN

## 2024-02-01 MED ORDER — ASPIRIN 81 MG PO CHEW
81.0000 mg | CHEWABLE_TABLET | ORAL | Status: AC
  Administered 2024-02-02: 81 mg via ORAL
  Filled 2024-02-01: qty 1

## 2024-02-01 MED ORDER — SODIUM CHLORIDE 0.9 % WEIGHT BASED INFUSION: 3.0000 mL/kg/h | INTRAVENOUS | Status: DC

## 2024-02-01 MED ORDER — SODIUM CHLORIDE 0.9% FLUSH
3.0000 mL | Freq: Two times a day (BID) | INTRAVENOUS | Status: DC
  Administered 2024-02-01 – 2024-02-04 (×5): 3 mL via INTRAVENOUS

## 2024-02-01 MED ORDER — SODIUM CHLORIDE 0.9 % IV SOLN: 250.0000 mL | INTRAVENOUS | Status: AC | PRN

## 2024-02-01 NOTE — Progress Notes (Signed)
 PHARMACY CONSULT NOTE - FOLLOW UP  Pharmacy Consult for Electrolyte Monitoring and Replacement   Recent Labs: Potassium (mmol/L)  Date Value  02/01/2024 3.9   Magnesium (mg/dL)  Date Value  16/09/9603 2.6 (H)   Calcium (mg/dL)  Date Value  54/08/8118 9.4   Albumin (g/dL)  Date Value  14/78/2956 2.3 (L)  11/17/2018 3.9   Phosphorus (mg/dL)  Date Value  21/30/8657 1.7 (L)   Sodium (mmol/L)  Date Value  02/01/2024 133 (L)  11/17/2018 141   Corrected Calcium:  10.7 mg/dL   (Calcium 9.3  albumin 2.3  Assessment: Patient is a 58 year old female with a past medical history of chronic pain syndrome, cirrhosis, and diabetes who was found unresponsive on her couch this morning. Pharmacy has been consulted to monitor and replace electrolytes as needed.  Goal of Therapy:  Electrolytes WNL  Plan:  Phos 1.7    (Scr 0.81   Na 133  K 3.9)     Will order Sodium phosphate 30 mmol IV x 1 Continue to monitor electrolytes daily  Raliyah Montella A, PharmD 02/01/2024 9:24 AM

## 2024-02-01 NOTE — Progress Notes (Signed)
 Progress Note   Patient: Abigail Molina:[id number] DOB: 1966-01-09 DOA: 01/26/2024     6 DOS: the patient was seen and examined on 02/01/2024   Brief hospital course: 58 y.o. female admitted with Acute Metabolic Encephalopathy and Severe Sepsis with Septic Shock in the setting of Klebsiella Oxytoca Bacteremia due to UTI and suspected pneumonia, along with NSTEMI, Acute Kidney Injury and Hyponatremia. Required intubation and mechanical ventilation, now extubated.  Nephrology both gave to AKI, cardiology on board due to concerns of NSTEMI with peak troponin 4087.   Assessment and Plan:     Acute toxic metabolic encephalopathy  #Mechanical intubation pain/discomfort  Hx: Meningioma and seizures  CT scan of the brain did not show any acute intracranial pathology Continue Keppra Monitor neurochecks closely   #Septic shock -present on admission- due to Klebsiella bacteremia and UTI #NSTEMI #Intermittent wide complex tachycardia  #Intermittent vtach #Intermittent bradycardia  Hx: HTN and HLD  Echocardiogram showed EF greater than 55% Will adjust following case discussed Continue aspirin 81 mg daily, start Crestor 20 mg daily -Hold outpatient antihypertensives and diuretic therapy  Cardiologist planning on cardiac catheterization tomorrow  #Severe Sepsis-present on admission due to bacteremia with Klebsiella #Klebsiella Oxytoca BACTEREMIA #Suspected Pneumonia and UTI Monitor culture results Infectious disease on board Antibiotics have been adjusted to p.o. Bactrim by infectious disease to complete a total of 2 weeks course for complicated UTI with last dose on 02/08/2024   #Acute respiratory failure  #Possible pneumonia  Patient with history of obstructive sleep apnea Patient extubated on 01/27/2024 and transferred to [facility] on 01/30/2024 Continue breathing treatments as recommended by pulmonologist Continue steroid therapy tapering     #Severe hyponatremia ~ IMPROVING #Acute kidney  injury secondary to ATN  ~ IMPROVING #Non anion gap metabolic acidosis ~ IMPROVING #Hyperkalemia ~ RESOLVED #Lactic acidosis  Continue to monitor renal function Avoid nephrotoxic medications Renally dose all drugs Nephrologist on board we appreciate input Monitor input and output   #NASH cirrhosis  #Portal hypertension  Hx: Esophageal varices  Continue outpatient lactulose and rifaximin    #Anemia with no signs of obvious bleeding  Monitor CBC We will transfuse when hemoglobin less than 7   #Type II diabetes mellitus  Monitor glucose level closely Continue insulin therapy   #Stage II pressure ulcer present on admission  Continue wound care management     Subjective:  Patient seen doing much better today Has also been seen by infectious disease and antibiotics have been switched to oral Bactrim Cardiology planning on cardiac catheterization tomorrow Denies nausea vomiting abdominal pain chest pain or cough   Physical Exam: General: Acutely on chronically on intranasal oxygen HENT: Atraumatic, normocephalic, neck Supple, no JVD  Lungs: Clear diminished throughout, even, non labored  Cardiovascular: Regular rate and rhythm, S1-S2 present no Mamma Abdomen: +BS x4, obese mildly distended Extremities: Normal bulk and tone, no deformities Skin: Stage II sacral spine pressure ulcer with erythema with abrasions present on buttocks present on admission     Data Reviewed:     Latest Ref Rng & Units 02/01/2024    4:20 AM 01/31/2024    8:29 AM 01/30/2024    4:26 AM  CBC  WBC 4.0 - 10.5 K/uL 9.1  8.8  8.7   Hemoglobin 12.0 - 15.0 g/dL 8.4  8.8  7.8   Hematocrit 36.0 - 46.0 % 26.5  27.7  24.5   Platelets 150 - 400 K/uL 153  148  124        Latest Ref  Rng & Units 02/01/2024    4:20 AM 01/31/2024    8:29 AM 01/30/2024    4:26 AM  BMP  Glucose 70 - 99 mg/dL 90  84  102   BUN 6 - 20 mg/dL 32  33  44   Creatinine 0.44 - 1.00 mg/dL 7.25  3.66  4.40   Sodium 135 - 145 mmol/L 133  134   132   Potassium 3.5 - 5.1 mmol/L 3.9  3.9  4.0   Chloride 98 - 111 mmol/L 99  99  98   CO2 22 - 32 mmol/L 26  27  26    Calcium 8.9 - 10.3 mg/dL 9.4  9.3  8.8     Vitals:   01/31/24 1944 02/01/24 0405 02/01/24 0829 02/01/24 1627  BP: (!) 109/58 (!) 110/59 96/60 99/61   Pulse: (!) 102 78 100 92  Resp: 16 14 14 14   Temp: 98.6 F (37 C) 98.2 F (36.8 C) 98 F (36.7 C) 98.2 F (36.8 C)  TempSrc: Oral  Oral Oral  SpO2: 96% 99% 97% 94%  Weight:      Height:         Author: Loyce Dys, MD 02/01/2024 4:28 PM  For on call review [email or web address].

## 2024-02-01 NOTE — Progress Notes (Signed)
 [facility] CLINIC CARDIOLOGY PROGRESS NOTE       Patient ID: Abigail Molina MRN: [id number] DOB/AGE: [birth year] 60 y.o.  Admit date: 01/26/2024 Referring Physician Harlon Ditty, NP Primary Physician Care, [place] [facility]  Primary Cardiologist None Reason for Consultation NSTEMI  HPI: Abigail Molina is a 58 y.o. female  with a past medical history of hypertension, hyperlipidemia, type 2 diabetes, anemia, endometrial cancer, OSA, GERD, NASH who presented to the ED on 01/26/2024 for unresponsiveness, found down by family and EMS brought her to the ED.  Troponins elevated.  Cardiology was consulted for further evaluation.   Interval history: -Patient seen and examined this morning, resting comfortably in bed. -She denies any dizziness symptoms.  BP stable.  -Denies any chest pain or shortness of breath symptoms. On room air. -Hgb stable today.  Review of systems complete and found to be negative unless listed above    Past Medical History:  Diagnosis Date   Anemia    Anxiety    Arthritis    Cancer (HCC)    Endometrial cancer   Chronic pain    Cirrhosis of liver (HCC)    Depression    Diabetes mellitus without complication (HCC)    Fatty liver disease, nonalcoholic 2021   GERD (gastroesophageal reflux disease)    Glaucoma    Hyperlipidemia    Hypertension    Meningioma (HCC)    OSA (obstructive sleep apnea) 05/08/2023   Overactive bladder    Pharmacologic therapy 11/02/2023   Psoriasis 2015    Past Surgical History:  Procedure Laterality Date   ABDOMINAL HYSTERECTOMY  2007   CHOLECYSTECTOMY  2000    Medications Prior to Admission  Medication Sig Dispense Refill Last Dose/Taking   aspirin 81 MG tablet Take 81 mg by mouth daily.   01/25/2024   Baclofen 5 MG TABS Take 2 tablets by mouth at bedtime.   01/25/2024   buPROPion (WELLBUTRIN XL) 150 MG 24 hr tablet Take 1 tablet (150 mg total) by mouth daily. 30 tablet 3 01/25/2024   canagliflozin (INVOKANA) 300 MG TABS tablet Take by  mouth.   01/25/2024   carvedilol (COREG) 6.25 MG tablet Take 6.25 mg by mouth 2 (two) times daily.   01/25/2024   citalopram (CELEXA) 20 MG tablet Take 20 mg by mouth daily.   01/25/2024   Continuous Glucose Sensor (FREESTYLE LIBRE 3 SENSOR) MISC by Does not apply route daily.   01/26/2024   COSENTYX SENSOREADY, 300 MG, 150 MG/ML SOAJ Inject into the skin.   Taking   Ferrous Sulfate (SLOW FE) 142 (45 Fe) MG TBCR Take by mouth.   01/26/2024   furosemide (LASIX) 40 MG tablet Take 40 mg by mouth 2 (two) times daily.   01/25/2024   gabapentin (NEURONTIN) 300 MG capsule Take by mouth once.   01/25/2024   insulin lispro (HUMALOG) 100 UNIT/ML KwikPen Inject into the skin.   01/25/2024   lactulose (CHRONULAC) 10 GM/15ML solution SMARTSIG:Milliliter(s) By Mouth   Past Week   LANTUS SOLOSTAR 100 UNIT/ML Solostar Pen Inject 66 Units into the skin daily.   01/25/2024   levETIRAcetam (KEPPRA) 500 MG tablet Take 750 mg by mouth 2 (two) times daily.   Taking   MAGNESIUM-OXIDE 400 (240 Mg) MG tablet Take 2 tablets by mouth 2 (two) times daily.   01/25/2024   metFORMIN (GLUCOPHAGE-XR) 500 MG 24 hr tablet Take 1,000 mg by mouth 2 (two) times daily.   01/25/2024   mirtazapine (REMERON) 15 MG tablet Take 1 tablet (  15 mg total) by mouth at bedtime. 30 tablet 0 01/25/2024   ondansetron (ZOFRAN-ODT) 4 MG disintegrating tablet Dissolve 1 tablet (4 mg total) in the mouth every twelve (12) hours as needed for nausea.   Past Week   pantoprazole (PROTONIX) 40 MG tablet TAKE ONE TABLET BY MOUTH EVERY DAY 90 tablet 0 01/25/2024   promethazine (PHENERGAN) 12.5 MG tablet Take by mouth.   Taking   scopolamine (TRANSDERM-SCOP) 1 MG/3DAYS Place 1 patch onto the skin every 3 (three) days.   Taking   spironolactone (ALDACTONE) 100 MG tablet Take 100 mg by mouth 2 (two) times daily.   01/25/2024   XIFAXAN 550 MG TABS tablet Take 550 mg by mouth 2 (two) times daily.   01/25/2024   zolpidem (AMBIEN) 5 MG tablet Take 1 tablet (5 mg total) by mouth at  bedtime as needed for sleep. 30 tablet 1 Past Week   Accu-Chek Softclix Lancets lancets SMARTSIG:Topical      BD PEN NEEDLE NANO 2ND GEN 32G X 4 MM MISC USE 1 NIGHTLY      chlorhexidine (PERIDEX) 0.12 % solution SMARTSIG:0.5 Capful(s) By Mouth Morning-Evening (Patient not taking: Reported on 01/26/2024)   Not Taking   clotrimazole (MYCELEX) 10 MG troche Take 10 mg by mouth 5 (five) times daily. (Patient not taking: Reported on 01/26/2024)   Not Taking   Continuous Glucose Receiver (DEXCOM G7 RECEIVER) DEVI USE AS INSTRUCTED WITH G7 SENSORS (Patient not taking: Reported on 01/26/2024)   Not Taking   Continuous Glucose Sensor (DEXCOM G7 SENSOR) MISC USE TO MONITOR BLOOD GLUCOSE LEVELS CONTINUOUSLY. CHANGE SENSOR EVERY 10 DAYS. (Patient not taking: Reported on 01/26/2024)   Not Taking   Dulaglutide (TRULICITY) 1.5 MG/0.5ML SOPN Inject 1.5 mg into the skin once a week. Thursdays (Patient not taking: Reported on 01/26/2024)   Not Taking   insulin glargine-yfgn (SEMGLEE, YFGN,) 100 UNIT/ML Pen INJECT 50 UNITS UNDER THE SKIN NIGHTLY AS DIRECTED BY YOUR DOCTOR AROUND SAME TIME EVERY DAY. TOTAL DAILY DOSE NOT TO EXCEED 50 UNITS (Patient not taking: Reported on 01/26/2024)   Not Taking   Insulin Pen Needle 32G X 6 MM MISC 1 Syringe by Does not apply route. Use with Victoza.      naltrexone (DEPADE) 50 MG tablet Take 50 mg by mouth daily.      Secukinumab (COSENTYX SENSOREADY PEN) 150 MG/ML SOAJ Inject the contents of 2 pens (300 mg total) under the skin once a week at weeks 0, 1, 2, 3, and 4. THEN inject the contents of 2 pens (300 mg total) every 4 weeks thereafter. (Patient not taking: Reported on 01/26/2024)   Not Taking   Sharps Container (BD SHARPS COLLECTOR) MISC Use as directed to dispose of Cosentyx pens.      Social History   Socioeconomic History   Marital status: Widowed    Spouse name: Not on file   Number of children: Not on file   Years of education: Not on file   Highest education level: Associate  degree: academic program  Occupational History   Not on file  Tobacco Use   Smoking status: Never   Smokeless tobacco: Never  Vaping Use   Vaping status: Never Used  Substance and Sexual Activity   Alcohol use: Not Currently    Comment:  Rarely. One or two drinks a year    Drug use: No   Sexual activity: Not Currently    Birth control/protection: Abstinence  Other Topics Concern   Not on file  Social History Narrative   PT is not working right now. Pt worries about financial needs since her husband died in 05/23/2024 and she has no income. She applied for Widow's benefits in July but has not heard any information and the application is still pending. If possible please inquire about application status.     Social Drivers of Corporate investment banker Strain: Low Risk  (12/16/2023)   [facility]   Overall Financial Resource Strain (CARDIA)    Difficulty of Paying Living Expenses: Not very hard  Food Insecurity: Patient Unable To Answer (01/26/2024)   Hunger Vital Sign    Worried About Running Out of Food in the Last Year: Patient unable to answer    Ran Out of Food in the Last Year: Patient unable to answer  Transportation Needs: Patient Unable To Answer (01/26/2024)   PRAPARE - Transportation    Lack of Transportation (Medical): Patient unable to answer    Lack of Transportation (Non-Medical): Patient unable to answer  Physical Activity: Insufficiently Active (01/09/2021)   Received from [facility], [facility]   Exercise Vital Sign    Days of Exercise per Week: 1 day    Minutes of Exercise per Session: 40 min  Stress: Stress Concern Present (09/17/2022)   Received from [facility], [facility] of Occupational Health - Occupational Stress Questionnaire    Feeling of Stress : Very much  Social Connections: Moderately Isolated (09/02/2018)   Social Connection and Isolation Panel [NHANES]    Frequency of Communication with Friends  and Family: Once a week    Frequency of Social Gatherings with Friends and Family: Twice a week    Attends Religious Services: Never    Database administrator or Organizations: No    Attends Banker Meetings: Never    Marital Status: Widowed  Intimate Partner Violence: Patient Unable To Answer (01/26/2024)   Humiliation, Afraid, Rape, and Kick questionnaire    Fear of Current or Ex-Partner: Patient unable to answer    Emotionally Abused: Patient unable to answer    Physically Abused: Patient unable to answer    Sexually Abused: Patient unable to answer    Family History  Problem Relation Age of Onset   Diabetes Mother    Hypertension Mother    Depression Mother    Obesity Mother    Vision loss Mother    Alcohol abuse Cousin    Bipolar disorder Cousin    Heart disease Maternal Grandfather    Alcohol abuse Maternal Grandmother    Stroke Paternal Grandfather      Vitals:   01/31/24 0811 01/31/24 1944 02/01/24 0405 02/01/24 0829  BP: 99/64 (!) 109/58 (!) 110/59 96/60  Pulse: 91 (!) 102 78 100  Resp: 20 16 14 14   Temp: 98 F (36.7 C) 98.6 F (37 C) 98.2 F (36.8 C) 98 F (36.7 C)  TempSrc: Oral Oral  Oral  SpO2: 96% 96% 99% 97%  Weight:      Height:        PHYSICAL EXAM General: Well appearing female, well nourished, in no acute distress. HEENT: Normocephalic and atraumatic. Neck: No JVD.  Lungs: Normal respiratory effort on room air. Clear bilaterally to auscultation. No wheezes, crackles, rhonchi.  Heart: HRRR. Normal S1 and S2 without gallops or murmurs.  Abdomen: Non-distended appearing.  Msk: Normal strength and tone for age. Extremities: Warm and well perfused. No clubbing, cyanosis. No edema.  Neuro: Alert and oriented X 3. Psych: Answers questions appropriately.   Labs: Basic Metabolic Panel: Recent Labs    01/30/24 0426 01/31/24 0829 02/01/24 0420  NA 132* 134* 133*  K 4.0 3.9 3.9  CL 98 99 99  CO2 26 27 26   GLUCOSE 186* 84 90  BUN  44* 33* 32*  CREATININE 0.74 0.84 0.81  CALCIUM 8.8* 9.3 9.4  MG 2.6*  --   --   PHOS 2.1* 1.4* 1.7*   Liver Function Tests: Recent Labs    01/31/24 0829 02/01/24 0420  ALBUMIN 2.3* 2.3*   No results for input(s): "LIPASE", "AMYLASE" in the last 72 hours. CBC: Recent Labs    01/31/24 0829 02/01/24 0420  WBC 8.8 9.1  HGB 8.8* 8.4*  HCT 27.7* 26.5*  MCV 82.7 82.0  PLT 148* 153   Cardiac Enzymes: No results for input(s): "CKTOTAL", "CKMB", "CKMBINDEX", "TROPONINIHS" in the last 72 hours.  BNP: No results for input(s): "BNP" in the last 72 hours.  D-Dimer: No results for input(s): "DDIMER" in the last 72 hours. Hemoglobin A1C: No results for input(s): "HGBA1C" in the last 72 hours.  Fasting Lipid Panel: No results for input(s): "CHOL", "HDL", "LDLCALC", "TRIG", "CHOLHDL", "LDLDIRECT" in the last 72 hours. Thyroid Function Tests: No results for input(s): "TSH", "T4TOTAL", "T3FREE", "THYROIDAB" in the last 72 hours.  Invalid input(s): "FREET3" Anemia Panel: No results for input(s): "VITAMINB12", "FOLATE", "FERRITIN", "TIBC", "IRON", "RETICCTPCT" in the last 72 hours.   Radiology: EEG adult Result Date: 01/26/2024 Charlsie Quest, MD     01/26/2024  4:46 PM Patient Name: Abigail Molina MRN: [id number] Epilepsy Attending: Charlsie Quest Referring Physician/Provider: Ezequiel Essex, NP Date: 01/26/2024 Duration: 32.31 mins Patient history: 58yo female with ams. EEG to evaluate for seizure Level of alertness: comatose AEDs during EEG study: LEV Technical aspects: This EEG study was done with scalp electrodes positioned according to the 10-20 International system of electrode placement. Electrical activity was reviewed with band pass filter of 1-70Hz , sensitivity of 7 uV/mm, display speed of 53mm/sec with a 60Hz  notched filter applied as appropriate. EEG data were recorded continuously and digitally stored.  Video monitoring was available and reviewed as appropriate. Description:  EEG showed continuous generalized 3 to 5 Hz theta-delta slowing. Hyperventilation and photic stimulation were not performed.   ABNORMALITY - Continuous slow, generalized IMPRESSION: This study is suggestive of severe diffuse encephalopathy. No seizures or epileptiform discharges were seen throughout the recording. Charlsie Quest   ECHOCARDIOGRAM COMPLETE Result Date: 01/26/2024    ECHOCARDIOGRAM REPORT   Patient Name:   Abigail Molina Date of Exam: 01/26/2024 Medical Rec #:  [id number]    Height:       64.0 in Accession #:    [id number]   Weight:       212.0 lb Date of Birth:  09/23/1966    BSA:          2.005 m Patient Age:    57 years     BP:           85/65 mmHg Patient Gender: F            HR:           Not listed in chart bpm. Exam Location:  [facility] Procedure: 2D Echo, Cardiac Doppler and Color Doppler (Both Spectral and Color            Flow Doppler were utilized during procedure). Indications:     Abnormal ECG R94.31  History:         Patient has no prior history of Echocardiogram examinations.                  Risk Factors:Hypertension and Dyslipidemia. Anxiety.  Sonographer:     Cristela Blue Referring Phys:  [id number] Ezequiel Essex Diagnosing Phys: Yvonne Kendall MD  Sonographer Comments: Echo performed with patient supine and on artificial respirator and no parasternal window. Zoll pad over sternum. IMPRESSIONS  1. Left ventricular ejection fraction, by estimation, is >55%. The left ventricle has normal function. Left ventricular endocardial border not optimally defined to evaluate regional wall motion. Left ventricular diastolic parameters were normal.  2. Right ventricular systolic function is normal. The right ventricular size is normal. Tricuspid regurgitation signal is inadequate for assessing PA pressure.  3. The mitral valve is normal in structure. No evidence of mitral valve regurgitation. No evidence of mitral stenosis.  4. The aortic valve was not well visualized. Aortic valve regurgitation is not  visualized. No aortic stenosis is present. FINDINGS  Left Ventricle: Left ventricular ejection fraction, by estimation, is >55%. The left ventricle has normal function. Left ventricular endocardial border not optimally defined to evaluate regional wall motion. Strain imaging was not performed. The left ventricular internal cavity size was normal in size. There is borderline left ventricular hypertrophy. Left ventricular diastolic parameters were normal. Right Ventricle: The right ventricular size is normal. No increase in right ventricular wall thickness. Right ventricular systolic function is normal. Tricuspid regurgitation signal is inadequate for assessing PA pressure. Left Atrium: Left atrial size was normal in size. Right Atrium: Right atrial size was normal in size. Pericardium: There is no evidence of pericardial effusion. Mitral Valve: The mitral valve is normal in structure. No evidence of mitral valve regurgitation. No evidence of mitral valve stenosis. MV peak gradient, 13.0 mmHg. The mean mitral valve gradient is 4.0 mmHg. Tricuspid Valve: The tricuspid valve is normal in structure. Tricuspid valve regurgitation is trivial. Aortic Valve: The aortic valve was not well visualized. Aortic valve regurgitation is not visualized. No aortic stenosis is present. Aortic valve mean gradient measures 3.0 mmHg. Aortic valve peak gradient measures 5.3 mmHg. Aortic valve area, by VTI measures 3.79 cm. Pulmonic Valve: The pulmonic valve was not well visualized. Pulmonic valve regurgitation is not visualized. No evidence of pulmonic stenosis. Aorta: The aortic root is normal in size and structure. Pulmonary Artery: The pulmonary artery is not well seen. Venous: IVC assessment for right atrial pressure unable to be performed due to mechanical ventilation. IAS/Shunts: No atrial level shunt detected by color flow Doppler. Additional Comments: 3D imaging was not performed.  LEFT VENTRICLE PLAX 2D LVIDd:         3.50 cm    Diastology LVIDs:         2.60 cm   LV e' medial:    12.90 cm/s LV PW:         1.10 cm   LV E/e' medial:  6.0 LV IVS:        1.00 cm   LV e' lateral:   16.90 cm/s LVOT diam:     2.00 cm   LV E/e' lateral: 4.6 LV SV:         63 LV SV Index:   31 LVOT Area:     3.14 cm  RIGHT VENTRICLE RV Basal diam:  3.40 cm RV Mid diam:    3.50 cm RV S prime:     15.30 cm/s TAPSE (M-mode): 2.3  cm LEFT ATRIUM           Index        RIGHT ATRIUM           Index LA diam:      3.30 cm 1.65 cm/m   RA Area:     15.10 cm LA Vol (A2C): 19.1 ml 9.52 ml/m   RA Volume:   33.10 ml  16.51 ml/m LA Vol (A4C): 26.1 ml 13.01 ml/m  AORTIC VALVE AV Area (Vmax):    2.92 cm AV Area (Vmean):   3.13 cm AV Area (VTI):     3.79 cm AV Vmax:           115.00 cm/s AV Vmean:          85.900 cm/s AV VTI:            0.165 m AV Peak Grad:      5.3 mmHg AV Mean Grad:      3.0 mmHg LVOT Vmax:         107.00 cm/s LVOT Vmean:        85.600 cm/s LVOT VTI:          0.199 m LVOT/AV VTI ratio: 1.21  AORTA Ao Root diam: 3.10 cm MITRAL VALVE MV Area (PHT): 5.75 cm    SHUNTS MV Area VTI:   2.10 cm    Systemic VTI:  0.20 m MV Peak grad:  13.0 mmHg   Systemic Diam: 2.00 cm MV Mean grad:  4.0 mmHg MV Vmax:       1.80 m/s MV Vmean:      88.2 cm/s MV Decel Time: 132 msec MV E velocity: 77.60 cm/s MV A velocity: 93.80 cm/s MV E/A ratio:  0.83 Cristal Deer End MD Electronically signed by Yvonne Kendall MD Signature Date/Time: 01/26/2024/4:06:41 PM    Final    DG Chest Port 1 View Result Date: 01/26/2024 CLINICAL DATA:  Central line readjustment. EXAM: PORTABLE CHEST 1 VIEW COMPARISON:  Chest x-ray from same day at 1247 hours. FINDINGS: The patient remains rotated to the left. Interval retraction of the left internal jugular central venous catheter with the tip now in the distal SVC. Unchanged endotracheal and enteric tubes. Unchanged cardiomegaly and left retrocardiac opacity. No pleural effusion or pneumothorax. No acute osseous abnormality. IMPRESSION: 1. Interval  retraction of the left internal jugular central venous catheter with the tip now in the distal SVC. Electronically Signed   By: Obie Dredge M.D.   On: 01/26/2024 14:11   DG Chest Port 1 View Result Date: 01/26/2024 CLINICAL DATA:  Central line placement. EXAM: PORTABLE CHEST 1 VIEW COMPARISON:  Chest x-ray from same day at 0950 hours. FINDINGS: The patient is rotated to the left. New left internal jugular central venous catheter with tip in the proximal right atrium. Unchanged endotracheal and enteric tubes. Unchanged cardiomegaly. Unchanged retrocardiac left lower lobe density. Clear right lung. No large pleural effusion or pneumothorax. No acute osseous abnormality. IMPRESSION: 1. New left internal jugular central venous catheter with tip in the proximal right atrium. No pneumothorax. 2. Unchanged retrocardiac left lower lobe atelectasis or pneumonia. Electronically Signed   By: Obie Dredge M.D.   On: 01/26/2024 14:10   DG Abdomen 1 View Result Date: 01/26/2024 CLINICAL DATA:  post og tube placement EXAM: ABDOMEN - 1 VIEW COMPARISON:  Chest XR and CT AP, concurrent. FINDINGS: Support lines: Enteric decompression tube, with tube tip and side port within stomach. The imaged bowel gas pattern is normal. Cholecystectomy  clips. No radio-opaque calculi or other significant radiographic abnormality. IMPRESSION: 1. NG tube, well-positioned with tip and side port within the stomach. 2. Nonobstructed bowel-gas pattern. Electronically Signed   By: Roanna Banning M.D.   On: 01/26/2024 11:44   DG Chest Portable 1 View Result Date: 01/26/2024 CLINICAL DATA:  post intubation EXAM: PORTABLE CHEST 1 VIEW COMPARISON:  Chest XR, 01/26/2024.  CT AP, concurrent. FINDINGS: Support lines: Intubation with ETT within the distal thoracic trachea, 3 cm from carina. Enteric decompression tube with tip extending outside field. Cardiomegaly. Aortic arch atherosclerosis. Mediastinum is within normal limits given technique and  patient rotation. The RIGHT lung is clear. Patchy retrocardiac opacity. No large pleural effusion or pneumothorax. No acute displaced fracture. IMPRESSION: 1. Intubation with ETT well-positioned at the distal thoracic trachea. Additional lines and tubes, as above. 2. Cardiomegaly and Aortic Atherosclerosis (ICD10-I70.0). 3. Retrocardiac opacity is likely to represent atelectasis, however early pneumonia can appear similar. Electronically Signed   By: Roanna Banning M.D.   On: 01/26/2024 11:41   CT ABDOMEN PELVIS WO CONTRAST Result Date: 01/26/2024 CLINICAL DATA:  Sepsis EXAM: CT ABDOMEN AND PELVIS WITHOUT CONTRAST TECHNIQUE: Multidetector CT imaging of the abdomen and pelvis was performed following the standard protocol without IV contrast. RADIATION DOSE REDUCTION: This exam was performed according to the departmental dose-optimization program which includes automated exposure control, adjustment of the mA and/or kV according to patient size and/or use of iterative reconstruction technique. COMPARISON:  CT AP, 11/12/2012 and 08/10/2008. Chest XR and KUB, concurrent. FINDINGS: Lower chest: Trace LEFT pleural effusion with adjacent dependent patchy pulmonary consolidation. Hepatobiliary: Gross nodular contour liver. No focal abnormality. Cholecystectomy. No biliary dilatation. Pancreas: No pancreatic ductal dilatation or surrounding inflammatory changes. Spleen: Splenomegaly, measuring up to 17.0 cm Adrenals/Urinary Tract: Adrenal glands are unremarkable. Kidneys are normal, without renal calculi, focal lesion, or hydronephrosis. Bladder is decompressed by Foley catheter. Stomach/Bowel: Stomach is within normal limits. Enteric decompression tube, with tip within the gastric body. Appendix appears normal. No evidence of bowel wall thickening, distention, or inflammatory changes. Vascular/Lymphatic: Aortic atherosclerosis without aneurysmal dilatation. No enlarged abdominal or pelvic lymph nodes. Reproductive:  Hysterectomy.  No adnexal mass. Other: No abdominal wall hernia or abnormality. Trace mesenteric stranding. Small volume perihepatic, perisplenic, LEFT lower quadrant and deep pelvic ascites. Musculoskeletal: Mild body wall edema. No acute or significant osseous findings. IMPRESSION: Suboptimal evaluation, secondary to a lack of intravenous contrast. 1. No acute abdominopelvic process. 2. Cirrhosis with portal hypertension, as evidenced by splenomegaly. Small volume of abdominopelvic ascites. 3. Trace LEFT pleural effusion, with adjacent pulmonary consolidation. This may represent atelectasis, though early pneumonia can appear similar. Electronically Signed   By: Roanna Banning M.D.   On: 01/26/2024 11:20   CT HEAD WO CONTRAST ( ) Result Date: 01/26/2024 CLINICAL DATA:  Mental status change, unknown cause; Neck trauma, focal neuro deficit or paresthesia (Age 75-64y) EXAM: CT HEAD WITHOUT CONTRAST CT CERVICAL SPINE WITHOUT CONTRAST TECHNIQUE: Multidetector CT imaging of the head and cervical spine was performed following the standard protocol without intravenous contrast. Multiplanar CT image reconstructions of the cervical spine were also generated. RADIATION DOSE REDUCTION: This exam was performed according to the departmental dose-optimization program which includes automated exposure control, adjustment of the mA and/or kV according to patient size and/or use of iterative reconstruction technique. COMPARISON:  None Available. FINDINGS: CT HEAD FINDINGS Brain: No hemorrhage. No hydrocephalus. No extra-axial fluid collection. No mass effect. No mass lesion. 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 Vascular: No  hyperdense vessel or unexpected calcification. Skull: Soft tissue swelling along the left frontal scalp. No evidence of an underlying calvarial fracture. Sinuses/Orbits: No middle ear or mastoid effusion. Paranasal sinuses are clear. Orbits are unremarkable Other: None. CT CERVICAL SPINE  FINDINGS Alignment: Normal. Skull base and vertebrae: No acute fracture. No primary bone lesion or focal pathologic process. Soft tissues and spinal canal: No prevertebral fluid or swelling. No visible canal hematoma. Disc levels:  CT evidence of high-grade spinal canal stenosis Upper chest: Negative. Other: Partially imaged endotracheal and enteric tubes in place. IMPRESSION: 1. No CT evidence of intracranial injury. 2. 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 If there is clinical concern for an acute infarct, consider further evaluation with MRI. 3. Soft tissue swelling along the left frontal scalp. No evidence of an underlying calvarial fracture. 4. No acute fracture or traumatic subluxation of the cervical spine. Electronically Signed   By: Lorenza Cambridge M.D.   On: 01/26/2024 10:46   CT Cervical Spine Wo Contrast Result Date: 01/26/2024 CLINICAL DATA:  Mental status change, unknown cause; Neck trauma, focal neuro deficit or paresthesia (Age 24-64y) EXAM: CT HEAD WITHOUT CONTRAST CT CERVICAL SPINE WITHOUT CONTRAST TECHNIQUE: Multidetector CT imaging of the head and cervical spine was performed following the standard protocol without intravenous contrast. Multiplanar CT image reconstructions of the cervical spine were also generated. RADIATION DOSE REDUCTION: This exam was performed according to the departmental dose-optimization program which includes automated exposure control, adjustment of the mA and/or kV according to patient size and/or use of iterative reconstruction technique. COMPARISON:  None Available. FINDINGS: CT HEAD FINDINGS Brain: No hemorrhage. No hydrocephalus. No extra-axial fluid collection. No mass effect. No mass lesion. 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 Vascular: No hyperdense vessel or unexpected calcification. Skull: Soft tissue swelling along the left frontal scalp. No evidence of an underlying calvarial fracture. Sinuses/Orbits: No middle ear or mastoid  effusion. Paranasal sinuses are clear. Orbits are unremarkable Other: None. CT CERVICAL SPINE FINDINGS Alignment: Normal. Skull base and vertebrae: No acute fracture. No primary bone lesion or focal pathologic process. Soft tissues and spinal canal: No prevertebral fluid or swelling. No visible canal hematoma. Disc levels:  CT evidence of high-grade spinal canal stenosis Upper chest: Negative. Other: Partially imaged endotracheal and enteric tubes in place. IMPRESSION: 1. No CT evidence of intracranial injury. 2. 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 If there is clinical concern for an acute infarct, consider further evaluation with MRI. 3. Soft tissue swelling along the left frontal scalp. No evidence of an underlying calvarial fracture. 4. No acute fracture or traumatic subluxation of the cervical spine. Electronically Signed   By: Lorenza Cambridge M.D.   On: 01/26/2024 10:46   DG Chest Port 1 View Result Date: 01/26/2024 CLINICAL DATA:  Sepsis. EXAM: PORTABLE CHEST 1 VIEW COMPARISON:  Chest radiograph dated 07/20/2021. FINDINGS: Shallow inspiration with minimal bibasilar atelectasis. No focal consolidation, pleural effusion, or pneumothorax. Mild cardiomegaly with mild central vascular congestion. No acute osseous pathology. IMPRESSION: Mild cardiomegaly with mild central vascular congestion. No focal consolidation. Electronically Signed   By: Elgie Collard M.D.   On: 01/26/2024 10:14    ECHO as above  TELEMETRY reviewed by me 02/01/2024: sinus rhythm rate 80-90s  EKG reviewed by me: Sinus rhythm rate 82 bpm  Data reviewed by me 02/01/2024: last 24h vitals tele labs imaging I/O PCCM notes, hospitalist progress note  Principal Problem:   Septic shock (HCC)    ASSESSMENT AND PLAN:  Abigail Molina is  a 58 y.o. female  with a past medical history of hypertension, hyperlipidemia, type 2 diabetes, anemia, endometrial cancer, OSA, GERD, NASH who presented to the ED on 01/26/2024 for unresponsiveness,  found down by family and EMS brought her to the ED.  Troponins elevated.  Cardiology was consulted for further evaluation.   # NSTEMI, type I vs type II # Septic shock # Acute metabolic encephalopathy # Anemia Patient brought to the ED from home after being found unresponsive and hypotensive. Intubated in the ED. Placed on pressors and treated for septic shock, found to be bacteremic. Extubated today 02/01/24. Troponins trended 179 > 591 > 886 > 1349 > 2680 > 4087 > 3717. Echo this admission with preserved EF. Patient with intermittent CP for 6 months, none at present.  -Hold further heparin given anemia.  S/p blood transfusion yesterday, Hgb stable this AM. Would recommend GI consult prior to [place] proceeding with heart catheterization as if we proceed with cath and she needs intervention she would require heparin during procedure and DAPT afterwards. -Given bacteremia, heart cath initially deferred. See further recommendations above. -Continue aspirin 81 mg daily.  Continue Crestor 20 mg daily. -BP remains borderline low, off of pressors.  -Further management of septic shock as per PCCM.   This patient's plan of care was discussed and created with Dr. Juliann Pares and he is in agreement.  Signed: Gale Journey, PA-C  02/01/2024, 9:53 AM [facility] [place] [facility]

## 2024-02-01 NOTE — Progress Notes (Signed)
 Physical Therapy Treatment Patient Details Name: Abigail Molina MRN: [id number] DOB: 1966-11-12 Today's Date: 02/01/2024   History of Present Illness Patient is a 58 year old female found unresponsive, was intubated. Found to have septic shock, NSTEMI, acute metabolic encephalopathy. History of hypertension, hyperlipidemia, type 2 diabetes, anemia, endometrial cancer, OSA, GERD, NASH.    PT Comments  Pt remains weak, but overall much improved since evaluation several days ago. No physical assist required for mobility. Pt able to safely partake in transfers and AMB training today. Will continue to follow.    If plan is discharge home, recommend the following: Two people to help with walking and/or transfers;A lot of help with bathing/dressing/bathroom;Help with stairs or ramp for entrance;Assist for transportation   Can travel by private vehicle     No  Equipment Recommendations  None recommended by PT    Recommendations for Other Services       Precautions / Restrictions Precautions Precautions: Fall Restrictions Weight Bearing Restrictions Per Provider Order: No     Mobility  Bed Mobility Overal bed mobility: Needs Assistance Bed Mobility: Supine to Sit, Sit to Supine     Supine to sit: Supervision Sit to supine: Supervision   General bed mobility comments: lacks nuance, heavy effort, yet successful; author watches PICC line for safety    Transfers Overall transfer level: Needs assistance Equipment used: Rolling walker (2 wheels) Transfers: Sit to/from Stand Sit to Stand: Supervision                Ambulation/Gait Ambulation/Gait assistance: Contact guard assist, Min assist Gait Distance (Feet): 68 Feet Assistive device: Rolling walker (2 wheels) Gait Pattern/deviations: Step-through pattern Gait velocity: 0.71m/s     General Gait Details: slow, but seteady, no LOB, heavy use of RWl; VSS, pt denies dizziness, CP, SOB   Stairs              Wheelchair Mobility     Tilt Bed    Modified Rankin (Stroke Patients Only)       Balance                                            Communication    Cognition                                        Cueing    Exercises Other Exercises Other Exercises: STS from elevated EOB x5, supervision level Other Exercises: AMB 2 laps from EOB to door and back (2 bouts with seated recovery between): 2x64ft    General Comments        Pertinent Vitals/Pain Pain Assessment Pain Assessment: No/denies pain    Home Living                          Prior Function            PT Goals (current goals can now be found in the care plan section) Acute Rehab PT Goals Patient Stated Goal: to go home PT Goal Formulation: With patient Time For Goal Achievement: 02/11/24 Potential to Achieve Goals: Good Progress towards PT goals: Progressing toward goals    Frequency    Min 1X/week      PT Plan      Co-evaluation  AM-PAC PT "6 Clicks" Mobility   Outcome Measure  Help needed turning from your back to your side while in a flat bed without using bedrails?: A Little Help needed moving from lying on your back to sitting on the side of a flat bed without using bedrails?: A Little Help needed moving to and from a bed to a chair (including a wheelchair)?: A Little Help needed standing up from a chair using your arms (e.g., wheelchair or bedside chair)?: A Little Help needed to walk in hospital room?: A Little Help needed climbing 3-5 steps with a railing? : A Little 6 Click Score: 18    End of Session   Activity Tolerance: Patient limited by fatigue;Patient tolerated treatment well Patient left: with call bell/phone within reach;with bed alarm set;in bed (HOB at 48 degrees) Nurse Communication: Mobility status PT Visit Diagnosis: Muscle weakness (generalized) (M62.81);Unsteadiness on feet (R26.81)     Time:  4098-1191 PT Time Calculation (min) (ACUTE ONLY): 31 min  Charges:    $Therapeutic Activity: 23-37 mins PT General Charges $$ ACUTE PT VISIT: 1 Visit                    11:26 AM, 02/01/24 Rosamaria Lints, PT, DPT Physical Therapist - [facility]  [phone] (ASCOM)    Charmayne Odell C 02/01/2024, 11:21 AM

## 2024-02-01 NOTE — Progress Notes (Addendum)
 [facility]  Date of Admission:  01/26/2024   Total days of inpatient antibiotics 6  Principal Problem:   Septic shock [facility])          Assessment: 58 year old female with history of sleep disorder on Ambien, MDD followed by psychiatry, social anxiety disorder, psoriasis, cirrhosis was found down by family admitted with #Klebsiella oxytoca bacteremia secondary UTI  #Cirrhosis with portal hypertension - Patient was hypotensive unresponsive, mechanically intubated on arrival. - Blood cultures grew Klebsiella oxytoca.  UA showed small leukocytes, negative nitrites.  CT abdomen pelvis showed cirrhosis with portal hypertension, no radiographic evidence of urinary tract infection noted on imaging.  Small volume abdominal pelvic ascites noted.  Trace left pleural effusion with adjacent primary consolidation. - RVP negative.  CT head noted soft tissue swelling of left scalp.  Age-indeterminate left cerebral infarct. -Urine CX+ Klebsiella. She states she had dysuyria x 1 week PTA. Recommendations: -D/C ceftriaxone. Switch to bactirm 1 ds bid to complete 2 weeks of abx eot 3/10 for bacteremia 2/2 complicated UTI. (Given pt took some time to defervesce/come off of pressors) -ID will sign off  Evaluation of this patient requires complex antimicrobial therapy evaluation and counseling + isolation needs for disease transmission risk assessment and mitigation    Microbiology:   Antibiotics: Ceftriaxone 2/25- Azithromycin 2/25- Refaxamin 2/25 Vancomycin 2/25   Cultures: Blood 2/25 1/2 Klebsiella  oxytoca Urine 2/25  Other 2/25 Rsp Cx re-incuabting   SUBJECTIVE: Restin in bed. No new complaints Interval: Afebrile overnight.   Review of Systems: Review of Systems  All other systems reviewed and are negative.    Scheduled Meds:  vitamin C  500 mg Oral BID   aspirin  81 mg Oral Daily   buPROPion  150 mg Oral Daily   Chlorhexidine Gluconate Cloth   6 each Topical Daily   citalopram  20 mg Oral Daily   feeding supplement (GLUCERNA SHAKE)  237 mL Oral TID BM   Gerhardt's butt cream   Topical BID   insulin aspart  0-20 Units Subcutaneous Q4H   insulin aspart  5 Units Subcutaneous TID WC   insulin glargine  26 Units Subcutaneous QHS   lactulose  20 g Oral BID   levETIRAcetam  750 mg Oral BID   midodrine  10 mg Oral TID WC   mirtazapine  15 mg Oral QHS   multivitamin with minerals  1 tablet Oral Daily   pantoprazole (PROTONIX) IV  40 mg Intravenous Q12H   [START ON 02/02/2024] predniSONE  40 mg Oral Q breakfast   Followed by   Melene Muller ON 02/03/2024] predniSONE  35 mg Oral Q breakfast   Followed by   Melene Muller ON 02/04/2024] predniSONE  30 mg Oral Q breakfast   Followed by   Melene Muller ON 02/05/2024] predniSONE  25 mg Oral Q breakfast   Followed by   Melene Muller ON 02/06/2024] predniSONE  20 mg Oral Q breakfast   Followed by   Melene Muller ON 02/07/2024] predniSONE  15 mg Oral Q breakfast   Followed by   Melene Muller ON 02/08/2024] predniSONE  10 mg Oral Q breakfast   Followed by   Melene Muller ON 02/09/2024] predniSONE  5 mg Oral Q breakfast   rifaximin  550 mg Oral BID   rosuvastatin  20 mg Oral Daily   sodium chloride flush  10-40 mL Intracatheter Q12H   [START ON 02/02/2024] sulfamethoxazole-trimethoprim  1 tablet Oral Q12H   thiamine  100  mg Oral Daily   zinc sulfate (50mg  elemental zinc)  220 mg Oral Daily   Continuous Infusions:  sodium PHOSPHATE IVPB (in mmol) 30 mmol (02/01/24 1012)   PRN Meds:.acetaminophen, docusate sodium, fentaNYL (SUBLIMAZE) injection, mouth rinse, polyethylene glycol, polyvinyl alcohol, sodium chloride flush No Known Allergies  OBJECTIVE: Vitals:   01/31/24 0811 01/31/24 1944 02/01/24 0405 02/01/24 0829  BP: 99/64 (!) 109/58 (!) 110/59 96/60  Pulse: 91 (!) 102 78 100  Resp: 20 16 14 14   Temp: 98 F (36.7 C) 98.6 F (37 C) 98.2 F (36.8 C) 98 F (36.7 C)  TempSrc: Oral Oral  Oral  SpO2: 96% 96% 99% 97%  Weight:      Height:        Body mass index is 36.06 kg/m.  Physical Exam Constitutional:      Appearance: Normal appearance.  HENT:     Head: Normocephalic and atraumatic.     Right Ear: Tympanic membrane normal.     Left Ear: Tympanic membrane normal.     Nose: Nose normal.     Mouth/Throat:     Mouth: Mucous membranes are moist.  Eyes:     Extraocular Movements: Extraocular movements intact.     Conjunctiva/sclera: Conjunctivae normal.     Pupils: Pupils are equal, round, and reactive to light.  Cardiovascular:     Rate and Rhythm: Normal rate and regular rhythm.     Heart sounds: No murmur heard.    No friction rub. No gallop.  Pulmonary:     Effort: Pulmonary effort is normal.     Breath sounds: Normal breath sounds.  Abdominal:     General: Abdomen is flat.     Palpations: Abdomen is soft.  Musculoskeletal:        General: Normal range of motion.  Skin:    General: Skin is warm and dry.  Neurological:     General: No focal deficit present.     Mental Status: She is alert and oriented to person, place, and time.  Psychiatric:        Mood and Affect: Mood normal.       Lab Results Lab Results  Component Value Date   WBC 9.1 02/01/2024   HGB 8.4 (L) 02/01/2024   HCT 26.5 (L) 02/01/2024   MCV 82.0 02/01/2024   PLT 153 02/01/2024    Lab Results  Component Value Date   CREATININE 0.81 02/01/2024   BUN 32 (H) 02/01/2024   NA 133 (L) 02/01/2024   K 3.9 02/01/2024   CL 99 02/01/2024   CO2 26 02/01/2024    Lab Results  Component Value Date   ALT 26 01/26/2024   AST 71 (H) 01/26/2024   ALKPHOS 204 (H) 01/26/2024   BILITOT 5.1 (H) 01/26/2024        Danelle Earthly, MD [facility] 02/01/2024, 11:54 AM

## 2024-02-02 ENCOUNTER — Encounter
Admission: EM | Disposition: A | Discharge status: Home Health | Payer: Self-pay | Source: Home / Self Care | Attending: Internal Medicine

## 2024-02-02 DIAGNOSIS — R6521 Severe sepsis with septic shock: Secondary | ICD-10-CM | POA: Diagnosis not present

## 2024-02-02 DIAGNOSIS — A419 Sepsis, unspecified organism: Secondary | ICD-10-CM | POA: Diagnosis not present

## 2024-02-02 LAB — GLUCOSE, CAPILLARY
Glucose-Capillary: 110 mg/dL — ABNORMAL HIGH (ref 70–99)
Glucose-Capillary: 143 mg/dL — ABNORMAL HIGH (ref 70–99)
Glucose-Capillary: 178 mg/dL — ABNORMAL HIGH (ref 70–99)
Glucose-Capillary: 198 mg/dL — ABNORMAL HIGH (ref 70–99)
Glucose-Capillary: 205 mg/dL — ABNORMAL HIGH (ref 70–99)
Glucose-Capillary: 206 mg/dL — ABNORMAL HIGH (ref 70–99)
Glucose-Capillary: 249 mg/dL — ABNORMAL HIGH (ref 70–99)

## 2024-02-02 LAB — CBC
HCT: 23.8 % — ABNORMAL LOW (ref 36.0–46.0)
Hemoglobin: 7.5 g/dL — ABNORMAL LOW (ref 12.0–15.0)
MCH: 26.1 pg (ref 26.0–34.0)
MCHC: 31.5 g/dL (ref 30.0–36.0)
MCV: 82.9 fL (ref 80.0–100.0)
Platelets: 135 10*3/uL — ABNORMAL LOW (ref 150–400)
RBC: 2.87 MIL/uL — ABNORMAL LOW (ref 3.87–5.11)
RDW: 18.9 % — ABNORMAL HIGH (ref 11.5–15.5)
WBC: 8 10*3/uL (ref 4.0–10.5)
nRBC: 0 % (ref 0.0–0.2)

## 2024-02-02 LAB — BASIC METABOLIC PANEL
Anion gap: 6 (ref 5–15)
BUN: 30 mg/dL — ABNORMAL HIGH (ref 6–20)
CO2: 24 mmol/L (ref 22–32)
Calcium: 9.2 mg/dL (ref 8.9–10.3)
Chloride: 104 mmol/L (ref 98–111)
Creatinine, Ser: 0.84 mg/dL (ref 0.44–1.00)
GFR, Estimated: >60 mL/min (ref >60)
Glucose, Bld: 145 mg/dL — ABNORMAL HIGH (ref 70–99)
Potassium: 3.8 mmol/L (ref 3.5–5.1)
Sodium: 134 mmol/L — ABNORMAL LOW (ref 135–145)

## 2024-02-02 SURGERY — LEFT HEART CATH AND CORONARY ANGIOGRAPHY
Anesthesia: Moderate Sedation

## 2024-02-02 MED ORDER — PANTOPRAZOLE SODIUM 40 MG PO TBEC
40.0000 mg | DELAYED_RELEASE_TABLET | Freq: Two times a day (BID) | ORAL | Status: DC
  Administered 2024-02-02 – 2024-02-08 (×13): 40 mg via ORAL
  Filled 2024-02-02 (×14): qty 1

## 2024-02-02 NOTE — Progress Notes (Signed)
 Occupational Therapy Treatment Patient Details Name: Abigail Molina MRN: [id number] DOB: January 14, 1966 Today's Date: 02/02/2024   History of present illness Patient is a 58 year old female found unresponsive, was intubated. Found to have septic shock, NSTEMI, acute metabolic encephalopathy. History of hypertension, hyperlipidemia, type 2 diabetes, anemia, endometrial cancer, OSA, GERD, NASH.   OT comments  Upon entering the room, pt supine in bed and agreeable to OT intervention. Pt performing supine >sit with supervision and stands with min guard from bed with use of RW. Pt ambulating with RW to bathroom for toileting needs. Pt needing min guard for balance with clothing management and hygiene. Pt standing at sink for hand hygiene, to wash face, and brushing teeth with close supervision. Pt returning to bed at end of session. Call bell and all needed items within reach upon exiting the room.       If plan is discharge home, recommend the following:  A little help with walking and/or transfers;Assistance with cooking/housework;Assist for transportation;Help with stairs or ramp for entrance;A little help with bathing/dressing/bathroom   Equipment Recommendations  Other (comment) (defer to next venue of care)       Precautions / Restrictions Precautions Precautions: Fall       Mobility Bed Mobility Overal bed mobility: Needs Assistance Bed Mobility: Supine to Sit, Sit to Supine     Supine to sit: Supervision Sit to supine: Supervision        Transfers Overall transfer level: Needs assistance Equipment used: Rolling walker (2 wheels) Transfers: Sit to/from Stand Sit to Stand: Supervision     Step pivot transfers: Contact guard assist, Min assist           Balance Overall balance assessment: Needs assistance Sitting-balance support: Bilateral upper extremity supported Sitting balance-Leahy Scale: Good     Standing balance support: Bilateral upper extremity supported, During  functional activity, Reliant on assistive device for balance Standing balance-Leahy Scale: Good                             ADL either performed or assessed with clinical judgement   ADL Overall ADL's : Needs assistance/impaired                         Toilet Transfer: Contact guard assist;Rolling walker (2 wheels);Regular Toilet   Toileting- Clothing Manipulation and Hygiene: Contact guard assist              Extremity/Trunk Assessment Upper Extremity Assessment Upper Extremity Assessment: Generalized weakness   Lower Extremity Assessment Lower Extremity Assessment: Generalized weakness        Vision Patient Visual Report: No change from baseline               Cognition Arousal: Alert Behavior During Therapy: WFL for tasks assessed/performed                                                        Pertinent Vitals/ Pain       Pain Assessment Pain Assessment: No/denies pain         Frequency  Min 1X/week        Progress Toward Goals  OT Goals(current goals can now be found in the care plan section)  Progress towards OT goals:  Progressing toward goals      AM-PAC OT "6 Clicks" Daily Activity     Outcome Measure   Help from another person eating meals?: None Help from another person taking care of personal grooming?: A Little Help from another person toileting, which includes using toliet, bedpan, or urinal?: A Little Help from another person bathing (including washing, rinsing, drying)?: A Little Help from another person to put on and taking off regular upper body clothing?: A Little Help from another person to put on and taking off regular lower body clothing?: A Little 6 Click Score: 19    End of Session Equipment Utilized During Treatment: Gait belt;Rolling walker (2 wheels)  OT Visit Diagnosis: Unsteadiness on feet (R26.81);Muscle weakness (generalized) (M62.81);Pain   Activity Tolerance Patient  tolerated treatment well   Patient Left in bed;with call bell/phone within reach;with bed alarm set   Nurse Communication Mobility status        Time: 1610-9604 OT Time Calculation (min): 17 min  Charges: OT General Charges $OT Visit: 1 Visit OT Treatments $Self Care/Home Management : 8-22 mins  Jackquline Denmark, MS, OTR/L , CBIS ascom [phone]  02/02/24, 1:05 PM

## 2024-02-02 NOTE — Plan of Care (Signed)

## 2024-02-02 NOTE — Progress Notes (Signed)
 Physical Therapy Treatment Patient Details Name: Abigail Molina MRN: [id number] DOB: October 27, 1966 Today's Date: 02/02/2024   History of Present Illness Patient is a 58 year old female found unresponsive, was intubated. Found to have septic shock, NSTEMI, acute metabolic encephalopathy. History of hypertension, hyperlipidemia, type 2 diabetes, anemia, endometrial cancer, OSA, GERD, NASH.    PT Comments  Pt agreeable to PT session, lunch recently finished. Pt able to transfers supervision level from EOB and toilet, but legs remain generally weak, especially limited from low surfaces. PT able to progress AMB distance today and 2 bouts of longer distance overall, no LOB, speed increased by 17%, however still indicative of falls risk. Pt assist to BR at end of session, but unable to pass bowels. Pt assisted back to bed in preparation for visitors. Will continue to follow.    If plan is discharge home, recommend the following: Two people to help with walking and/or transfers;A lot of help with bathing/dressing/bathroom;Help with stairs or ramp for entrance;Assist for transportation   Can travel by private vehicle     No  Equipment Recommendations  None recommended by PT    Recommendations for Other Services       Precautions / Restrictions Precautions Precautions: Fall Restrictions Weight Bearing Restrictions Per Provider Order: No     Mobility  Bed Mobility Overal bed mobility: Modified Independent Bed Mobility: Supine to Sit, Sit to Supine     Supine to sit: Modified independent (Device/Increase time) Sit to supine: Modified independent (Device/Increase time)        Transfers Overall transfer level: Needs assistance Equipment used: Rolling walker (2 wheels) Transfers: Sit to/from Stand Sit to Stand: Supervision           General transfer comment: can only STS twice from standard height, requires elevation    Ambulation/Gait Ambulation/Gait assistance: Contact guard  assist Gait Distance (Feet): 100 Feet Assistive device: Rolling walker (2 wheels) Gait Pattern/deviations: Step-to pattern Gait velocity: 0.83m/s; (0.55m/s  on 02/01/24)     General Gait Details: report to feel wobbly several times today   Stairs             Wheelchair Mobility     Tilt Bed    Modified Rankin (Stroke Patients Only)       Balance                                            Communication    Cognition                                        Cueing    Exercises Other Exercises Other Exercises: STS from elevated EOB x5, supervision level (2 from standard height) Other Exercises: AMB with RW 4ftm, then AMB with RW x184ft (sit break between)    General Comments        Pertinent Vitals/Pain      Home Living                          Prior Function            PT Goals (current goals can now be found in the care plan section) Acute Rehab PT Goals Patient Stated Goal: go to rehab PT Goal Formulation: With patient  Time For Goal Achievement: 02/11/24 Potential to Achieve Goals: Good Progress towards PT goals: Progressing toward goals    Frequency    Min 1X/week      PT Plan      Co-evaluation              AM-PAC PT "6 Clicks" Mobility   Outcome Measure  Help needed turning from your back to your side while in a flat bed without using bedrails?: A Little Help needed moving from lying on your back to sitting on the side of a flat bed without using bedrails?: A Little Help needed moving to and from a bed to a chair (including a wheelchair)?: A Little Help needed standing up from a chair using your arms (e.g., wheelchair or bedside chair)?: A Little Help needed to walk in hospital room?: A Little Help needed climbing 3-5 steps with a railing? : A Little 6 Click Score: 18    End of Session Equipment Utilized During Treatment: Gait belt Activity Tolerance: Patient tolerated treatment  well;No increased pain Patient left: with call bell/phone within reach;with bed alarm set;in bed;with family/visitor present Nurse Communication: Mobility status PT Visit Diagnosis: Muscle weakness (generalized) (M62.81);Unsteadiness on feet (R26.81)     Time: 1610-9604 PT Time Calculation (min) (ACUTE ONLY): 23 min  Charges:    $Therapeutic Activity: 23-37 mins PT General Charges $$ ACUTE PT VISIT: 1 Visit                    2:33 PM, 02/02/24 Rosamaria Lints, PT, DPT Physical Therapist - [facility]  [phone] (ASCOM)     Zaelyn Barbary C 02/02/2024, 2:32 PM

## 2024-02-02 NOTE — Inpatient Diabetes Management (Signed)
 Inpatient Diabetes Program Recommendations  AACE/ADA: New Consensus Statement on Inpatient Glycemic Control   Target Ranges:  Prepandial:   less than 140 mg/dL      Peak postprandial:   less than 180 mg/dL (1-2 hours)      Critically ill patients:  140 - 180 mg/dL    Latest Reference Range & Units 02/02/24 00:45 02/02/24 04:01 02/02/24 08:08  Glucose-Capillary 70 - 99 mg/dL 213 (H)  Novolog 7 units 178 (H)  Novolog 4 units 110 (H)  Novolog 5 units  Prednisone 40 mg    Latest Reference Range & Units 02/01/24 00:00 02/01/24 04:01 02/01/24 09:25 02/01/24 10:33 02/01/24 11:50 02/01/24 12:59 02/01/24 16:29 02/01/24 21:52  Glucose-Capillary 70 - 99 mg/dL 086 (H)  Novolog 4 units 90 79     Prednisone 45 mg 134 (H) 156 (H) 220 (H)  Novolog 12 units 296 (H)  Novolog 16 units 284 (H)  Novolog 11 units  Lantus 26 units   Review of Glycemic Control  Diabetes history: DM2 Outpatient Diabetes medications: Lantus 65 units daily, Metformin 1000 mg BID, Invokana 300 mg daily Current orders for Inpatient glycemic control: Lantus 26 units at bedtime, Novolog 5 units TID with meals, Novolog 0-20 units Q4H; Prednisone taper  Inpatient Diabetes Program Recommendations:    Insulin: Please consider increasing Lantus to 30 units at bedtime and changing CBGs to AC&HS and Novolog 0-20 units to AC&HS.  Thanks, Orlando Penner, RN, MSN, CDCES Diabetes Coordinator Inpatient Diabetes Program [phone] (Team Pager from 8am to 5pm)

## 2024-02-02 NOTE — Plan of Care (Signed)

## 2024-02-02 NOTE — Progress Notes (Signed)
 Progress Note   Patient: Abigail Molina:[id number] DOB: 1966-01-21 DOA: 01/26/2024     7 DOS: the patient was seen and examined on 02/02/2024    Brief hospital course: 58 y.o. female admitted with Acute Metabolic Encephalopathy and Severe Sepsis with Septic Shock in the setting of Klebsiella Oxytoca Bacteremia due to UTI and suspected pneumonia, along with NSTEMI, Acute Kidney Injury and Hyponatremia. Required intubation and mechanical ventilation, now extubated.  Nephrology both gave to AKI, cardiology on board due to concerns of NSTEMI with peak troponin 4087.   Assessment and Plan:     Acute toxic metabolic encephalopathy  #Mechanical intubation pain/discomfort  Hx: Meningioma and seizures  CT scan of the brain did not show any acute intracranial pathology Continue Keppra Monitor neurochecks closely   #Septic shock -present on admission- due to Klebsiella bacteremia and UTI #NSTEMI #Intermittent wide complex tachycardia  #Intermittent vtach #Intermittent bradycardia  Hx: HTN and HLD  Echocardiogram showed EF greater than 55% Continue aspirin 81 mg daily, start Crestor 20 mg daily -Hold outpatient antihypertensives and diuretic therapy  Cardiologist planning cardiac catheterization as an outpatient procedure   #Severe Sepsis-present on admission due to bacteremia with Klebsiella #Klebsiella Oxytoca BACTEREMIA #Suspected Pneumonia and UTI Monitor culture results Infectious disease on board Antibiotics have been adjusted to p.o. Bactrim by infectious disease to complete a total of 2 weeks course for complicated UTI with last dose on 02/08/2024   #Acute respiratory failure  #Possible pneumonia  Patient with history of obstructive sleep apnea Patient extubated on 01/27/2024 and transferred to [facility] on 01/30/2024 Continue breathing treatments as recommended by pulmonologist Continue steroid therapy tapering     #Severe hyponatremia ~ IMPROVING #Acute kidney injury secondary to  ATN  ~ IMPROVING #Non anion gap metabolic acidosis ~ IMPROVING #Hyperkalemia ~ RESOLVED #Lactic acidosis  Continue to monitor renal function Avoid nephrotoxic medications Renally dose all drugs Nephrologist on board we appreciate input Monitor input and output   #NASH cirrhosis  #Portal hypertension  Hx: Esophageal varices  Continue outpatient lactulose and rifaximin    #Anemia with no signs of obvious bleeding  Monitor CBC We will transfuse when hemoglobin less than 7   #Type II diabetes mellitus  Monitor glucose level closely Continue insulin therapy   #Stage II pressure ulcer present on admission  Continue wound care management     Subjective:  Patient seen and examined at bedside this morning Currently awaiting rehab placement Denies nausea vomiting abdominal pain chest pain or cough   Physical Exam: General: Acutely on chronically on intranasal oxygen HENT: Atraumatic, normocephalic, neck Supple, no JVD  Lungs: Clear diminished throughout, even, non labored  Cardiovascular: Regular rate and rhythm, S1-S2 present no Mamma Abdomen: +BS x4, obese mildly distended Extremities: Normal bulk and tone, no deformities Skin: Stage II sacral spine pressure ulcer with erythema with abrasions present on buttocks present on admission     Data Reviewed:    Latest Ref Rng & Units 02/02/2024    5:50 AM 02/01/2024    4:20 AM 01/31/2024    8:29 AM  CBC  WBC 4.0 - 10.5 K/uL 8.0  9.1  8.8   Hemoglobin 12.0 - 15.0 g/dL 7.5  8.4  8.8   Hematocrit 36.0 - 46.0 % 23.8  26.5  27.7   Platelets 150 - 400 K/uL 135  153  148        Latest Ref Rng & Units 02/02/2024    5:50 AM 02/01/2024    4:20 AM 01/31/2024  8:29 AM  BMP  Glucose 70 - 99 mg/dL 657  90  84   BUN 6 - 20 mg/dL 30  32  33   Creatinine 0.44 - 1.00 mg/dL 8.46  9.62  9.52   Sodium 135 - 145 mmol/L 134  133  134   Potassium 3.5 - 5.1 mmol/L 3.8  3.9  3.9   Chloride 98 - 111 mmol/L 104  99  99   CO2 22 - 32 mmol/L 24  26  27     Calcium 8.9 - 10.3 mg/dL 9.2  9.4  9.3      Vitals:   02/01/24 0829 02/01/24 1627 02/02/24 0353 02/02/24 0806  BP: 96/60 99/61 (!) 111/59 (!) 105/58  Pulse: 100 92 77 75  Resp: 14 14    Temp: 98 F (36.7 C) 98.2 F (36.8 C) 98.2 F (36.8 C) (!) 97.3 F (36.3 C)  TempSrc: Oral Oral Oral Oral  SpO2: 97% 94% 99% 99%  Weight:      Height:        Author: Loyce Dys, MD 02/02/2024 3:45 PM  For on call review [email or web address].

## 2024-02-02 NOTE — TOC Initial Note (Signed)
 Transition of Care [facility]) - Initial/Assessment Note    Patient Details  Name: Abigail Molina MRN: [id number] Date of Birth: 10/04/1966  [facility] CM/SW Contact:    Chapman Fitch, RN Phone Number: 02/02/2024, 4:12 PM  Clinical Narrative:                    Admitted for: septic shock Admitted from: home with cousin  PCP: [facility] Current home health/prior home health/DME: [facility] and rw  Therapy recommends SNF.  Patient in agreement.  Patient aware that facility may require her to turn over her  monthly check.  PASRR pending - clinical updated Fl2 sent for signature Bed search initiated        Patient Goals and CMS Choice            Expected Discharge Plan and Services                                              Prior Living Arrangements/Services                       Activities of Daily Living   ADL Screening (condition at time of admission) Independently performs ADLs?: Yes (appropriate for developmental age) (UTA) Is the patient deaf or have difficulty hearing?: No Does the patient have difficulty seeing, even when wearing glasses/contacts?: No Does the patient have difficulty concentrating, remembering, or making decisions?: No  Permission Sought/Granted                  Emotional Assessment              Admission diagnosis:  Acute hyponatremia [E87.1] Shock (HCC) [R57.9] Ventricular tachycardia (HCC) [I47.20] Septic shock (HCC) [A41.9, R65.21] Encephalopathy, unspecified type [G93.40] Patient Active Problem List   Diagnosis Date Noted   Septic shock (HCC) 01/26/2024   Dizziness 12/16/2023   Stage 3a chronic kidney disease (HCC) 12/16/2023   AKI (acute kidney injury) (HCC) 11/21/2023   Hyperkalemia 11/21/2023   Pre-syncope 11/21/2023   Abnormal drug screen 11/09/2023   Pain in joint of right shoulder 11/05/2023   Psoriasis 11/02/2023   Pharmacologic therapy 11/02/2023   Disorder of skeletal  system 11/02/2023   Problems influencing health status 11/02/2023   Chronic knee pain (5th area of Pain) (Bilateral) 11/02/2023   Osteoarthritis of knees (Bilateral) 11/02/2023   Chronic shoulder pain (3ry area of Pain) (Right) 11/02/2023   Chronic intractable headache (2ry area of Pain) 11/02/2023   Chronic neck pain (2ry area of Pain) 11/02/2023   Chronic low back pain (4th area of Pain) (Bilateral) w/o sciatica 11/02/2023   Left upper quadrant pain 08/17/2023   OSA (obstructive sleep apnea) 05/08/2023   Dysphagia 02/02/2023   Generalized pruritus 02/02/2023   Lichen planus of tongue 01/03/2023   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 type 2 diabetes mellitus (HCC) 12/02/2022   Myopia of both eyes with astigmatism and presbyopia 12/02/2022   Current moderate episode of major depressive disorder (HCC) 11/06/2022   Chronic pain syndrome 11/06/2022   Other insomnia 11/06/2022   Portal hypertension (HCC) 10/08/2022   Nausea 09/22/2022   Other ascites 09/22/2022   Right upper quadrant abdominal pain 09/22/2022   Calcified cerebral meningioma (HCC) 08/16/2022   Hx of psoriasis 08/06/2022   Encephalopathy, portal systemic (  HCC) 06/17/2022   Esophageal varices in cirrhosis (HCC) 06/17/2022   Secondary esophageal varices without bleeding (HCC) 06/17/2022   Unspecified cirrhosis of liver (HCC) 06/17/2022   NPDR (nonproliferative diabetic retinopathy) (HCC) 06/17/2021   Ptosis of eyelid, left 06/17/2021   Diabetes mellitus (HCC) 06/17/2021   Hepatic steatosis 03/13/2021   Coronary artery disease involving native heart without angina pectoris 02/05/2021   H/O diabetic gastroparesis 01/09/2021   Retinopathy of left eye, background, proliferative 01/09/2021   Angular blepharoconjunctivitis of both eyes 12/21/2020   Cataract associated with type 2 diabetes mellitus (HCC) 12/21/2020   Dry eye syndrome, bilateral 12/21/2020   Chronic  abdominal pain (1ry area of Pain) 03/04/2018   Diabetes (HCC) 12/12/2015   Hyperlipidemia 12/12/2015   Type 2 diabetes mellitus, with long-term current use of insulin (HCC) 12/12/2015   Hypertension 05/23/2015   Acid reflux 05/23/2015   PFD (pelvic floor dysfunction) 04/07/2013   Hemorrhage of rectum and anus 03/17/2011   PCP:  [facility]:   [facility] [address]), [place] - [address] [place]) [place] [address] Phone: [phone] Fax: [phone]     Social Drivers of Health (SDOH) Social History: SDOH Screenings   Food Insecurity: Patient Unable To Answer (01/26/2024)  Housing: Patient Unable To Answer (01/26/2024)  Transportation Needs: Patient Unable To Answer (01/26/2024)  Utilities: Patient Unable To Answer (01/26/2024)  Depression (PHQ2-9): High Risk (11/02/2023)  Financial Resource Strain: Low Risk  (12/16/2023)   Received from [facility]  Physical Activity: Insufficiently Active (01/09/2021)   Received from [facility], [facility]  Social Connections: Moderately Isolated (09/02/2018)  Stress: Stress Concern Present (09/17/2022)   Received from [facility] [place] [facility], [facility]  Tobacco Use: Low Risk  (01/26/2024)  Recent Concern: Tobacco Use - Medium Risk (01/04/2024)   Received from [facility] Literacy: Low Risk  (01/09/2021)   Received from [facility], [facility]   SDOH Interventions:     Readmission Risk Interventions     No data to display

## 2024-02-02 NOTE — Progress Notes (Signed)
 PHARMACY CONSULT NOTE - FOLLOW UP  Pharmacy Consult for Electrolyte Monitoring and Replacement   Recent Labs: Potassium (mmol/L)  Date Value  02/02/2024 3.8   Magnesium (mg/dL)  Date Value  16/09/9603 2.6 (H)   Calcium (mg/dL)  Date Value  54/08/8118 9.2   Albumin (g/dL)  Date Value  14/78/2956 2.3 (L)  11/17/2018 3.9   Phosphorus (mg/dL)  Date Value  21/30/8657 1.7 (L)   Sodium (mmol/L)  Date Value  02/02/2024 134 (L)  11/17/2018 141     Assessment: Patient is a 58 year old female with a past medical history of chronic pain syndrome, cirrhosis, and diabetes who was found unresponsive on her couch this morning. Pharmacy has been consulted to monitor and replace electrolytes as needed.  Goal of Therapy:  Electrolytes WNL  Plan:  Patient transferred from ICU. Pharmacy will sign off. Please re-consult if needed. Phos was replaced 3/3. No replacement needed today.   Ronnald Ramp, PharmD 02/02/2024 7:43 AM

## 2024-02-02 NOTE — TOC PASRR Note (Signed)
 RE: Abigail Molina Date of Birth: 12-15-1965 Date: 02/02/24      To Whom It May Concern:   Please be advised that the above-named patient will require a short-term nursing home stay - anticipated 30 days or less for rehabilitation and strengthening.  The plan is for return home

## 2024-02-02 NOTE — NC FL2 (Signed)
 [place] MEDICAID FL2 LEVEL OF CARE FORM     IDENTIFICATION  Patient Name: Abigail Molina Birthdate: 06/28/1966 Sex: female Admission Date (Current Location): 01/26/2024  [facility] and [place] Number:  Chiropodist and Address:         Provider Number: [id number]  Attending Physician Name and Address:  Loyce Dys, MD  Relative Name and Phone Number:       Current Level of Care: Hospital Recommended Level of Care: Skilled Nursing Facility Prior Approval Number:    Date Approved/Denied:   PASRR Number: pending  Discharge Plan: SNF    Current Diagnoses: Patient Active Problem List   Diagnosis Date Noted   Septic shock (HCC) 01/26/2024   Dizziness 12/16/2023   Stage 3a chronic kidney disease (HCC) 12/16/2023   AKI (acute kidney injury) (HCC) 11/21/2023   Hyperkalemia 11/21/2023   Pre-syncope 11/21/2023   Abnormal drug screen 11/09/2023   Pain in joint of right shoulder 11/05/2023   Psoriasis 11/02/2023   Pharmacologic therapy 11/02/2023   Disorder of skeletal system 11/02/2023   Problems influencing health status 11/02/2023   Chronic knee pain (5th area of Pain) (Bilateral) 11/02/2023   Osteoarthritis of knees (Bilateral) 11/02/2023   Chronic shoulder pain (3ry area of Pain) (Right) 11/02/2023   Chronic intractable headache (2ry area of Pain) 11/02/2023   Chronic neck pain (2ry area of Pain) 11/02/2023   Chronic low back pain (4th area of Pain) (Bilateral) w/o sciatica 11/02/2023   Left upper quadrant pain 08/17/2023   OSA (obstructive sleep apnea) 05/08/2023   Dysphagia 02/02/2023   Generalized pruritus 02/02/2023   Lichen planus of tongue 01/03/2023   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 type 2 diabetes mellitus (HCC) 12/02/2022   Myopia of both eyes with astigmatism and presbyopia 12/02/2022   Current moderate episode of major depressive disorder (HCC) 11/06/2022   Chronic pain syndrome  11/06/2022   Other insomnia 11/06/2022   Portal hypertension (HCC) 10/08/2022   Nausea 09/22/2022   Other ascites 09/22/2022   Right upper quadrant abdominal pain 09/22/2022   Calcified cerebral meningioma (HCC) 08/16/2022   Hx of psoriasis 08/06/2022   Encephalopathy, portal systemic (HCC) 06/17/2022   Esophageal varices in cirrhosis (HCC) 06/17/2022   Secondary esophageal varices without bleeding (HCC) 06/17/2022   Unspecified cirrhosis of liver (HCC) 06/17/2022   NPDR (nonproliferative diabetic retinopathy) (HCC) 06/17/2021   Ptosis of eyelid, left 06/17/2021   Diabetes mellitus (HCC) 06/17/2021   Hepatic steatosis 03/13/2021   Coronary artery disease involving native heart without angina pectoris 02/05/2021   H/O diabetic gastroparesis 01/09/2021   Retinopathy of left eye, background, proliferative 01/09/2021   Angular blepharoconjunctivitis of both eyes 12/21/2020   Cataract associated with type 2 diabetes mellitus (HCC) 12/21/2020   Dry eye syndrome, bilateral 12/21/2020   Chronic abdominal pain (1ry area of Pain) 03/04/2018   Diabetes (HCC) 12/12/2015   Hyperlipidemia 12/12/2015   Type 2 diabetes mellitus, with long-term current use of insulin (HCC) 12/12/2015   Hypertension 05/23/2015   Acid reflux 05/23/2015   PFD (pelvic floor dysfunction) 04/07/2013   Hemorrhage of rectum and anus 03/17/2011    Orientation RESPIRATION BLADDER Height & Weight     Self, Time, Situation, Place  Normal Continent Weight: 95.3 kg Height:  5\' 4"  (162.6 cm)  BEHAVIORAL SYMPTOMS/MOOD NEUROLOGICAL BOWEL NUTRITION STATUS      Continent Diet (Carb modified heart healthy)  AMBULATORY STATUS COMMUNICATION OF NEEDS Skin   Limited Assist Verbally  PU Stage and Appropriate Care, Bruising                       Personal Care Assistance Level of Assistance              Functional Limitations Info             SPECIAL CARE FACTORS FREQUENCY  PT (By licensed PT), OT (By licensed OT)                     Contractures Contractures Info: Not present    Additional Factors Info  Code Status, Allergies, Isolation Precautions Code Status Info: Full Allergies Info: NKDA     Isolation Precautions Info: MRSA     Current Medications (02/02/2024):  This is the current hospital active medication list Current Facility-Administered Medications  Medication Dose Route Frequency Provider Last Rate Last Admin   0.9 %  sodium chloride infusion  250 mL Intravenous PRN Hudson, Caralyn, PA-C       0.9% sodium chloride infusion  1 mL/kg/hr Intravenous Continuous Hudson, Caralyn, PA-C 95.3 mL/hr at 02/02/24 0623 1 mL/kg/hr at 02/02/24 3244   acetaminophen (TYLENOL) tablet 650 mg  650 mg Oral Q6H PRN Judithe Modest, NP   650 mg at 01/27/24 1747   ascorbic acid (VITAMIN C) tablet 500 mg  500 mg Oral BID Vida Rigger, MD   500 mg at 02/02/24 0913   aspirin chewable tablet 81 mg  81 mg Oral Daily Hudson, Caralyn, PA-C   81 mg at 02/01/24 0102   buPROPion (WELLBUTRIN XL) 24 hr tablet 150 mg  150 mg Oral Daily Vida Rigger, MD   150 mg at 02/02/24 0913   Chlorhexidine Gluconate Cloth 2 % PADS 6 each  6 each Topical Daily Vida Rigger, MD   6 each at 02/02/24 0919   citalopram (CELEXA) tablet 20 mg  20 mg Oral Daily Vida Rigger, MD   20 mg at 02/02/24 0913   docusate sodium (COLACE) capsule 100 mg  100 mg Oral BID PRN Tressie Ellis, RPH       feeding supplement (GLUCERNA SHAKE) (GLUCERNA SHAKE) liquid 237 mL  237 mL Oral TID BM Erin Fulling, MD   237 mL at 02/02/24 0917   fentaNYL (SUBLIMAZE) injection 25 mcg  25 mcg Intravenous Q2H PRN Harlon Ditty D, NP   25 mcg at 02/01/24 2209   Gerhardt's butt cream   Topical BID Vida Rigger, MD   Given at 02/02/24 7253   insulin aspart (novoLOG) injection 0-20 Units  0-20 Units Subcutaneous Q4H Rust-Chester, Britton L, NP   3 Units at 02/02/24 1238   insulin aspart (novoLOG) injection 5 Units  5 Units Subcutaneous TID WC  Rust-Chester, Britton L, NP   5 Units at 02/02/24 1239   insulin glargine (LANTUS) injection 26 Units  26 Units Subcutaneous QHS Rust-Chester, Britton L, NP   26 Units at 02/01/24 2153   lactulose (CHRONULAC) 10 GM/15ML solution 20 g  20 g Oral BID Tressie Ellis, RPH   20 g at 02/02/24 0913   levETIRAcetam (KEPPRA) tablet 750 mg  750 mg Oral BID Vida Rigger, MD   750 mg at 02/02/24 0912   midodrine (PROAMATINE) tablet 10 mg  10 mg Oral TID WC Harlon Ditty D, NP   10 mg at 02/02/24 1239   mirtazapine (REMERON) tablet 15 mg  15 mg Oral QHS Vida Rigger, MD   15 mg at 02/01/24 2153  multivitamin with minerals tablet 1 tablet  1 tablet Oral Daily Erin Fulling, MD   1 tablet at 02/02/24 5284   Oral care mouth rinse  15 mL Mouth Rinse PRN Vida Rigger, MD   15 mL at 01/28/24 0837   pantoprazole (PROTONIX) EC tablet 40 mg  40 mg Oral BID Loyce Dys, MD   40 mg at 02/02/24 0911   polyethylene glycol (MIRALAX / GLYCOLAX) packet 17 g  17 g Per Tube Daily PRN Ezequiel Essex, NP       polyvinyl alcohol (LIQUIFILM TEARS) 1.4 % ophthalmic solution 1 drop  1 drop Both Eyes PRN Erin Fulling, MD   1 drop at 01/29/24 2207   [START ON 02/03/2024] predniSONE (DELTASONE) tablet 35 mg  35 mg Oral Q breakfast Vida Rigger, MD       Followed by   Melene Muller ON 02/04/2024] predniSONE (DELTASONE) tablet 30 mg  30 mg Oral Q breakfast Vida Rigger, MD       Followed by   Melene Muller ON 02/05/2024] predniSONE (DELTASONE) tablet 25 mg  25 mg Oral Q breakfast Vida Rigger, MD       Followed by   Melene Muller ON 02/06/2024] predniSONE (DELTASONE) tablet 20 mg  20 mg Oral Q breakfast Vida Rigger, MD       Followed by   Melene Muller ON 02/07/2024] predniSONE (DELTASONE) tablet 15 mg  15 mg Oral Q breakfast Vida Rigger, MD       Followed by   Melene Muller ON 02/08/2024] predniSONE (DELTASONE) tablet 10 mg  10 mg Oral Q breakfast Vida Rigger, MD       Followed by   Melene Muller ON 02/09/2024] predniSONE (DELTASONE) tablet 5 mg  5  mg Oral Q breakfast Vida Rigger, MD       rifaximin Burman Blacksmith) tablet 550 mg  550 mg Oral BID Tressie Ellis, RPH   550 mg at 02/02/24 0912   rosuvastatin (CRESTOR) tablet 20 mg  20 mg Oral Daily Hudson, Caralyn, PA-C   20 mg at 02/02/24 1324   sodium chloride flush (NS) 0.9 % injection 10-40 mL  10-40 mL Intracatheter Q12H Vida Rigger, MD   10 mL at 02/02/24 0916   sodium chloride flush (NS) 0.9 % injection 10-40 mL  10-40 mL Intracatheter PRN Vida Rigger, MD       sodium chloride flush (NS) 0.9 % injection 3 mL  3 mL Intravenous Q12H Hudson, Caralyn, PA-C   3 mL at 02/02/24 0917   sodium chloride flush (NS) 0.9 % injection 3 mL  3 mL Intravenous PRN Hudson, Caralyn, PA-C       sulfamethoxazole-trimethoprim (BACTRIM DS) 800-160 MG per tablet 1 tablet  1 tablet Oral Q12H Danelle Earthly, MD   1 tablet at 02/02/24 4010   thiamine (VITAMIN B1) tablet 100 mg  100 mg Oral Daily Erin Fulling, MD   100 mg at 02/02/24 2725   zinc sulfate (50mg  elemental zinc) capsule 220 mg  220 mg Oral Daily Vida Rigger, MD   220 mg at 02/02/24 3664     Discharge Medications: Please see discharge summary for a list of discharge medications.  Relevant Imaging Results:  Relevant Lab Results:   Additional Information ss [id number]  Chapman Fitch, RN

## 2024-02-02 NOTE — Progress Notes (Signed)
 [facility] CLINIC CARDIOLOGY PROGRESS NOTE       Patient ID: Abigail Molina MRN: [id number] DOB/AGE: 1966/09/22 58 y.o.  Admit date: 01/26/2024 Referring Physician Harlon Ditty, NP Primary Physician Care, [place] [facility]  Primary Cardiologist None Reason for Consultation NSTEMI  HPI: Abigail Molina is a 58 y.o. female  with a past medical history of hypertension, hyperlipidemia, type 2 diabetes, anemia, endometrial cancer, OSA, GERD, NASH who presented to the ED on 01/26/2024 for unresponsiveness, found down by family and EMS brought her to the ED.  Troponins elevated.  Cardiology was consulted for further evaluation.   Interval history: -Patient seen and examined this morning, resting comfortably in bed. -Denies any chest pain or shortness of breath symptoms. On room air. -Hgb down to 7.5 today.  Review of systems complete and found to be negative unless listed above    Past Medical History:  Diagnosis Date   Anemia    Anxiety    Arthritis    Cancer (HCC)    Endometrial cancer   Chronic pain    Cirrhosis of liver (HCC)    Depression    Diabetes mellitus without complication (HCC)    Fatty liver disease, nonalcoholic 2021   GERD (gastroesophageal reflux disease)    Glaucoma    Hyperlipidemia    Hypertension    Meningioma (HCC)    OSA (obstructive sleep apnea) 05/08/2023   Overactive bladder    Pharmacologic therapy 11/02/2023   Psoriasis 2015    Past Surgical History:  Procedure Laterality Date   ABDOMINAL HYSTERECTOMY  2007   CHOLECYSTECTOMY  2000    Medications Prior to Admission  Medication Sig Dispense Refill Last Dose/Taking   aspirin 81 MG tablet Take 81 mg by mouth daily.   01/25/2024   Baclofen 5 MG TABS Take 2 tablets by mouth at bedtime.   01/25/2024   buPROPion (WELLBUTRIN XL) 150 MG 24 hr tablet Take 1 tablet (150 mg total) by mouth daily. 30 tablet 3 01/25/2024   canagliflozin (INVOKANA) 300 MG TABS tablet Take by mouth.   01/25/2024   carvedilol (COREG) 6.25  MG tablet Take 6.25 mg by mouth 2 (two) times daily.   01/25/2024   citalopram (CELEXA) 20 MG tablet Take 20 mg by mouth daily.   01/25/2024   Continuous Glucose Sensor (FREESTYLE LIBRE 3 SENSOR) MISC by Does not apply route daily.   01/26/2024   COSENTYX SENSOREADY, 300 MG, 150 MG/ML SOAJ Inject into the skin.   Taking   Ferrous Sulfate (SLOW FE) 142 (45 Fe) MG TBCR Take by mouth.   01/26/2024   furosemide (LASIX) 40 MG tablet Take 40 mg by mouth 2 (two) times daily.   01/25/2024   gabapentin (NEURONTIN) 300 MG capsule Take by mouth once.   01/25/2024   insulin lispro (HUMALOG) 100 UNIT/ML KwikPen Inject into the skin.   01/25/2024   lactulose (CHRONULAC) 10 GM/15ML solution SMARTSIG:Milliliter(s) By Mouth   Past Week   LANTUS SOLOSTAR 100 UNIT/ML Solostar Pen Inject 66 Units into the skin daily.   01/25/2024   levETIRAcetam (KEPPRA) 500 MG tablet Take 750 mg by mouth 2 (two) times daily.   Taking   MAGNESIUM-OXIDE 400 (240 Mg) MG tablet Take 2 tablets by mouth 2 (two) times daily.   01/25/2024   metFORMIN (GLUCOPHAGE-XR) 500 MG 24 hr tablet Take 1,000 mg by mouth 2 (two) times daily.   01/25/2024   mirtazapine (REMERON) 15 MG tablet Take 1 tablet (15 mg total) by mouth at bedtime.  30 tablet 0 01/25/2024   ondansetron (ZOFRAN-ODT) 4 MG disintegrating tablet Dissolve 1 tablet (4 mg total) in the mouth every twelve (12) hours as needed for nausea.   Past Week   pantoprazole (PROTONIX) 40 MG tablet TAKE ONE TABLET BY MOUTH EVERY DAY 90 tablet 0 01/25/2024   promethazine (PHENERGAN) 12.5 MG tablet Take by mouth.   Taking   scopolamine (TRANSDERM-SCOP) 1 MG/3DAYS Place 1 patch onto the skin every 3 (three) days.   Taking   spironolactone (ALDACTONE) 100 MG tablet Take 100 mg by mouth 2 (two) times daily.   01/25/2024   XIFAXAN 550 MG TABS tablet Take 550 mg by mouth 2 (two) times daily.   01/25/2024   zolpidem (AMBIEN) 5 MG tablet Take 1 tablet (5 mg total) by mouth at bedtime as needed for sleep. 30 tablet 1 Past  Week   Accu-Chek Softclix Lancets lancets SMARTSIG:Topical      BD PEN NEEDLE NANO 2ND GEN 32G X 4 MM MISC USE 1 NIGHTLY      chlorhexidine (PERIDEX) 0.12 % solution SMARTSIG:0.5 Capful(s) By Mouth Morning-Evening (Patient not taking: Reported on 01/26/2024)   Not Taking   clotrimazole (MYCELEX) 10 MG troche Take 10 mg by mouth 5 (five) times daily. (Patient not taking: Reported on 01/26/2024)   Not Taking   Continuous Glucose Receiver (DEXCOM G7 RECEIVER) DEVI USE AS INSTRUCTED WITH G7 SENSORS (Patient not taking: Reported on 01/26/2024)   Not Taking   Continuous Glucose Sensor (DEXCOM G7 SENSOR) MISC USE TO MONITOR BLOOD GLUCOSE LEVELS CONTINUOUSLY. CHANGE SENSOR EVERY 10 DAYS. (Patient not taking: Reported on 01/26/2024)   Not Taking   Dulaglutide (TRULICITY) 1.5 MG/0.5ML SOPN Inject 1.5 mg into the skin once a week. Thursdays (Patient not taking: Reported on 01/26/2024)   Not Taking   insulin glargine-yfgn (SEMGLEE, YFGN,) 100 UNIT/ML Pen INJECT 50 UNITS UNDER THE SKIN NIGHTLY AS DIRECTED BY YOUR DOCTOR AROUND SAME TIME EVERY DAY. TOTAL DAILY DOSE NOT TO EXCEED 50 UNITS (Patient not taking: Reported on 01/26/2024)   Not Taking   Insulin Pen Needle 32G X 6 MM MISC 1 Syringe by Does not apply route. Use with Victoza.      naltrexone (DEPADE) 50 MG tablet Take 50 mg by mouth daily.      Secukinumab (COSENTYX SENSOREADY PEN) 150 MG/ML SOAJ Inject the contents of 2 pens (300 mg total) under the skin once a week at weeks 0, 1, 2, 3, and 4. THEN inject the contents of 2 pens (300 mg total) every 4 weeks thereafter. (Patient not taking: Reported on 01/26/2024)   Not Taking   Sharps Container (BD SHARPS COLLECTOR) MISC Use as directed to dispose of Cosentyx pens.      Social History   Socioeconomic History   Marital status: Widowed    Spouse name: Not on file   Number of children: Not on file   Years of education: Not on file   Highest education level: Associate degree: academic program  Occupational  History   Not on file  Tobacco Use   Smoking status: Never   Smokeless tobacco: Never  Vaping Use   Vaping status: Never Used  Substance and Sexual Activity   Alcohol use: Not Currently    Comment:  Rarely. One or two drinks a year    Drug use: No   Sexual activity: Not Currently    Birth control/protection: Abstinence  Other Topics Concern   Not on file  Social History Narrative   PT  is not working right now. Pt worries about financial needs since her husband died in 06/04/24 and she has no income. She applied for Widow's benefits in July but has not heard any information and the application is still pending. If possible please inquire about application status.     Social Drivers of Corporate investment banker Strain: Low Risk  (12/16/2023)   Received from [facility]   Overall Financial Resource Strain (CARDIA)    Difficulty of Paying Living Expenses: Not very hard  Food Insecurity: Patient Unable To Answer (01/26/2024)   Hunger Vital Sign    Worried About Running Out of Food in the Last Year: Patient unable to answer    Ran Out of Food in the Last Year: Patient unable to answer  Transportation Needs: Patient Unable To Answer (01/26/2024)   PRAPARE - Transportation    Lack of Transportation (Medical): Patient unable to answer    Lack of Transportation (Non-Medical): Patient unable to answer  Physical Activity: Insufficiently Active (01/09/2021)   Received [facility]   Exercise Vital Sign    Days of Exercise per Week: 1 day    Minutes of Exercise per Session: 40 min  Stress: Stress Concern Present (09/17/2022)   Received from [facility] Occupational [facility] - Occupational Stress Questionnaire    Feeling of Stress : Very much  Social Connections: Moderately Isolated (09/02/2018)   Social Connection and Isolation Panel [NHANES]    Frequency of Communication with Friends and Family: Once a week    Frequency of  Social Gatherings with Friends and Family: Twice a week    Attends Religious Services: Never    Database administrator or Organizations: No    Attends Banker Meetings: Never    Marital Status: Widowed  Intimate Partner Violence: Patient Unable To Answer (01/26/2024)   Humiliation, Afraid, Rape, and Kick questionnaire    Fear of Current or Ex-Partner: Patient unable to answer    Emotionally Abused: Patient unable to answer    Physically Abused: Patient unable to answer    Sexually Abused: Patient unable to answer    Family History  Problem Relation Age of Onset   Diabetes Mother    Hypertension Mother    Depression Mother    Obesity Mother    Vision loss Mother    Alcohol abuse Cousin    Bipolar disorder Cousin    Heart disease Maternal Grandfather    Alcohol abuse Maternal Grandmother    Stroke Paternal Grandfather      Vitals:   02/01/24 0829 02/01/24 1627 02/02/24 0353 02/02/24 0806  BP: 96/60 99/61 (!) 111/59 (!) 105/58  Pulse: 100 92 77 75  Resp: 14 14    Temp: 98 F (36.7 C) 98.2 F (36.8 C) 98.2 F (36.8 C) (!) 97.3 F (36.3 C)  TempSrc: Oral Oral Oral Oral  SpO2: 97% 94% 99% 99%  Weight:      Height:        PHYSICAL EXAM General: Well appearing female, well nourished, in no acute distress. HEENT: Normocephalic and atraumatic. Neck: No JVD.  Lungs: Normal respiratory effort on room air. Clear bilaterally to auscultation. No wheezes, crackles, rhonchi.  Heart: HRRR. Normal S1 and S2 without gallops or murmurs.  Abdomen: Non-distended appearing.  Msk: Normal strength and tone for age. Extremities: Warm and well perfused. No clubbing, cyanosis. No edema.  Neuro: Alert and oriented X  3. Psych: Answers questions appropriately.   Labs: Basic Metabolic Panel: Recent Labs    01/31/24 0829 02/01/24 0420 02/02/24 0550  NA 134* 133* 134*  K 3.9 3.9 3.8  CL 99 99 104  CO2 27 26 24   GLUCOSE 84 90 145*  BUN 33* 32* 30*  CREATININE 0.84 0.81 0.84   CALCIUM 9.3 9.4 9.2  PHOS 1.4* 1.7*  --    Liver Function Tests: Recent Labs    01/31/24 0829 02/01/24 0420  ALBUMIN 2.3* 2.3*   No results for input(s): "LIPASE", "AMYLASE" in the last 72 hours. CBC: Recent Labs    02/01/24 0420 02/02/24 0550  WBC 9.1 8.0  HGB 8.4* 7.5*  HCT 26.5* 23.8*  MCV 82.0 82.9  PLT 153 135*   Cardiac Enzymes: No results for input(s): "CKTOTAL", "CKMB", "CKMBINDEX", "TROPONINIHS" in the last 72 hours.  BNP: No results for input(s): "BNP" in the last 72 hours.  D-Dimer: No results for input(s): "DDIMER" in the last 72 hours. Hemoglobin A1C: No results for input(s): "HGBA1C" in the last 72 hours.  Fasting Lipid Panel: No results for input(s): "CHOL", "HDL", "LDLCALC", "TRIG", "CHOLHDL", "LDLDIRECT" in the last 72 hours. Thyroid Function Tests: No results for input(s): "TSH", "T4TOTAL", "T3FREE", "THYROIDAB" in the last 72 hours.  Invalid input(s): "FREET3" Anemia Panel: No results for input(s): "VITAMINB12", "FOLATE", "FERRITIN", "TIBC", "IRON", "RETICCTPCT" in the last 72 hours.   Radiology: EEG adult Result Date: 01/26/2024 Charlsie Quest, MD     01/26/2024  4:46 PM Patient Name: BRYTTANI BLEW MRN: [id number] Epilepsy Attending: Charlsie Quest Referring Physician/Provider: Ezequiel Essex, NP Date: 01/26/2024 Duration: 32.31 mins Patient history: 58yo female with ams. EEG to evaluate for seizure Level of alertness: comatose AEDs during EEG study: LEV Technical aspects: This EEG study was done with scalp electrodes positioned according to the 10-20 International system of electrode placement. Electrical activity was reviewed with band pass filter of 1-70Hz , sensitivity of 7 uV/mm, display speed of 7mm/sec with a 60Hz  notched filter applied as appropriate. EEG data were recorded continuously and digitally stored.  Video monitoring was available and reviewed as appropriate. Description: EEG showed continuous generalized 3 to 5 Hz theta-delta  slowing. Hyperventilation and photic stimulation were not performed.   ABNORMALITY - Continuous slow, generalized IMPRESSION: This study is suggestive of severe diffuse encephalopathy. No seizures or epileptiform discharges were seen throughout the recording. Charlsie Quest   ECHOCARDIOGRAM COMPLETE Result Date: 01/26/2024    ECHOCARDIOGRAM REPORT   Patient Name:   NAVINA WOHLERS Date of Exam: 01/26/2024 Medical Rec #:  [id number]    Height:       64.0 in Accession #:    [id number]   Weight:       212.0 lb Date of Birth:  22-Sep-1966    BSA:          2.005 m Patient Age:    57 years     BP:           85/65 mmHg Patient Gender: F            HR:           Not listed in chart bpm. Exam Location:  [facility] Procedure: 2D Echo, Cardiac Doppler and Color Doppler (Both Spectral and Color            Flow Doppler were utilized during procedure). Indications:     Abnormal ECG R94.31  History:         Patient has  no prior history of Echocardiogram examinations.                  Risk Factors:Hypertension and Dyslipidemia. Anxiety.  Sonographer:     Cristela Blue Referring Phys:  [id number] Ezequiel Essex Diagnosing Phys: Yvonne Kendall MD  Sonographer Comments: Echo performed with patient supine and on artificial respirator and no parasternal window. Zoll pad over sternum. IMPRESSIONS  1. Left ventricular ejection fraction, by estimation, is >55%. The left ventricle has normal function. Left ventricular endocardial border not optimally defined to evaluate regional wall motion. Left ventricular diastolic parameters were normal.  2. Right ventricular systolic function is normal. The right ventricular size is normal. Tricuspid regurgitation signal is inadequate for assessing PA pressure.  3. The mitral valve is normal in structure. No evidence of mitral valve regurgitation. No evidence of mitral stenosis.  4. The aortic valve was not well visualized. Aortic valve regurgitation is not visualized. No aortic stenosis is present. FINDINGS  Left  Ventricle: Left ventricular ejection fraction, by estimation, is >55%. The left ventricle has normal function. Left ventricular endocardial border not optimally defined to evaluate regional wall motion. Strain imaging was not performed. The left ventricular internal cavity size was normal in size. There is borderline left ventricular hypertrophy. Left ventricular diastolic parameters were normal. Right Ventricle: The right ventricular size is normal. No increase in right ventricular wall thickness. Right ventricular systolic function is normal. Tricuspid regurgitation signal is inadequate for assessing PA pressure. Left Atrium: Left atrial size was normal in size. Right Atrium: Right atrial size was normal in size. Pericardium: There is no evidence of pericardial effusion. Mitral Valve: The mitral valve is normal in structure. No evidence of mitral valve regurgitation. No evidence of mitral valve stenosis. MV peak gradient, 13.0 mmHg. The mean mitral valve gradient is 4.0 mmHg. Tricuspid Valve: The tricuspid valve is normal in structure. Tricuspid valve regurgitation is trivial. Aortic Valve: The aortic valve was not well visualized. Aortic valve regurgitation is not visualized. No aortic stenosis is present. Aortic valve mean gradient measures 3.0 mmHg. Aortic valve peak gradient measures 5.3 mmHg. Aortic valve area, by VTI measures 3.79 cm. Pulmonic Valve: The pulmonic valve was not well visualized. Pulmonic valve regurgitation is not visualized. No evidence of pulmonic stenosis. Aorta: The aortic root is normal in size and structure. Pulmonary Artery: The pulmonary artery is not well seen. Venous: IVC assessment for right atrial pressure unable to be performed due to mechanical ventilation. IAS/Shunts: No atrial level shunt detected by color flow Doppler. Additional Comments: 3D imaging was not performed.  LEFT VENTRICLE PLAX 2D LVIDd:         3.50 cm   Diastology LVIDs:         2.60 cm   LV e' medial:    12.90  cm/s LV PW:         1.10 cm   LV E/e' medial:  6.0 LV IVS:        1.00 cm   LV e' lateral:   16.90 cm/s LVOT diam:     2.00 cm   LV E/e' lateral: 4.6 LV SV:         63 LV SV Index:   31 LVOT Area:     3.14 cm  RIGHT VENTRICLE RV Basal diam:  3.40 cm RV Mid diam:    3.50 cm RV S prime:     15.30 cm/s TAPSE (M-mode): 2.3 cm LEFT ATRIUM  Index        RIGHT ATRIUM           Index LA diam:      3.30 cm 1.65 cm/m   RA Area:     15.10 cm LA Vol (A2C): 19.1 ml 9.52 ml/m   RA Volume:   33.10 ml  16.51 ml/m LA Vol (A4C): 26.1 ml 13.01 ml/m  AORTIC VALVE AV Area (Vmax):    2.92 cm AV Area (Vmean):   3.13 cm AV Area (VTI):     3.79 cm AV Vmax:           115.00 cm/s AV Vmean:          85.900 cm/s AV VTI:            0.165 m AV Peak Grad:      5.3 mmHg AV Mean Grad:      3.0 mmHg LVOT Vmax:         107.00 cm/s LVOT Vmean:        85.600 cm/s LVOT VTI:          0.199 m LVOT/AV VTI ratio: 1.21  AORTA Ao Root diam: 3.10 cm MITRAL VALVE MV Area (PHT): 5.75 cm    SHUNTS MV Area VTI:   2.10 cm    Systemic VTI:  0.20 m MV Peak grad:  13.0 mmHg   Systemic Diam: 2.00 cm MV Mean grad:  4.0 mmHg MV Vmax:       1.80 m/s MV Vmean:      88.2 cm/s MV Decel Time: 132 msec MV E velocity: 77.60 cm/s MV A velocity: 93.80 cm/s MV E/A ratio:  0.83 Cristal Deer End MD Electronically signed by Yvonne Kendall MD Signature Date/Time: 01/26/2024/4:06:41 PM    Final    DG Chest Port 1 View Result Date: 01/26/2024 CLINICAL DATA:  Central line readjustment. EXAM: PORTABLE CHEST 1 VIEW COMPARISON:  Chest x-ray from same day at 1247 hours. FINDINGS: The patient remains rotated to the left. Interval retraction of the left internal jugular central venous catheter with the tip now in the distal SVC. Unchanged endotracheal and enteric tubes. Unchanged cardiomegaly and left retrocardiac opacity. No pleural effusion or pneumothorax. No acute osseous abnormality. IMPRESSION: 1. Interval retraction of the left internal jugular central venous  catheter with the tip now in the distal SVC. Electronically Signed   By: Obie Dredge M.D.   On: 01/26/2024 14:11   DG Chest Port 1 View Result Date: 01/26/2024 CLINICAL DATA:  Central line placement. EXAM: PORTABLE CHEST 1 VIEW COMPARISON:  Chest x-ray from same day at 0950 hours. FINDINGS: The patient is rotated to the left. New left internal jugular central venous catheter with tip in the proximal right atrium. Unchanged endotracheal and enteric tubes. Unchanged cardiomegaly. Unchanged retrocardiac left lower lobe density. Clear right lung. No large pleural effusion or pneumothorax. No acute osseous abnormality. IMPRESSION: 1. New left internal jugular central venous catheter with tip in the proximal right atrium. No pneumothorax. 2. Unchanged retrocardiac left lower lobe atelectasis or pneumonia. Electronically Signed   By: Obie Dredge M.D.   On: 01/26/2024 14:10   DG Abdomen 1 View Result Date: 01/26/2024 CLINICAL DATA:  post og tube placement EXAM: ABDOMEN - 1 VIEW COMPARISON:  Chest XR and CT AP, concurrent. FINDINGS: Support lines: Enteric decompression tube, with tube tip and side port within stomach. The imaged bowel gas pattern is normal. Cholecystectomy clips. No radio-opaque calculi or other significant radiographic abnormality. IMPRESSION: 1. NG tube,  well-positioned with tip and side port within the stomach. 2. Nonobstructed bowel-gas pattern. Electronically Signed   By: Roanna Banning M.D.   On: 01/26/2024 11:44   DG Chest Portable 1 View Result Date: 01/26/2024 CLINICAL DATA:  post intubation EXAM: PORTABLE CHEST 1 VIEW COMPARISON:  Chest XR, 01/26/2024.  CT AP, concurrent. FINDINGS: Support lines: Intubation with ETT within the distal thoracic trachea, 3 cm from carina. Enteric decompression tube with tip extending outside field. Cardiomegaly. Aortic arch atherosclerosis. Mediastinum is within normal limits given technique and patient rotation. The RIGHT lung is clear. Patchy  retrocardiac opacity. No large pleural effusion or pneumothorax. No acute displaced fracture. IMPRESSION: 1. Intubation with ETT well-positioned at the distal thoracic trachea. Additional lines and tubes, as above. 2. Cardiomegaly and Aortic Atherosclerosis (ICD10-I70.0). 3. Retrocardiac opacity is likely to represent atelectasis, however early pneumonia can appear similar. Electronically Signed   By: Roanna Banning M.D.   On: 01/26/2024 11:41   CT ABDOMEN PELVIS WO CONTRAST Result Date: 01/26/2024 CLINICAL DATA:  Sepsis EXAM: CT ABDOMEN AND PELVIS WITHOUT CONTRAST TECHNIQUE: Multidetector CT imaging of the abdomen and pelvis was performed following the standard protocol without IV contrast. RADIATION DOSE REDUCTION: This exam was performed according to the departmental dose-optimization program which includes automated exposure control, adjustment of the mA and/or kV according to patient size and/or use of iterative reconstruction technique. COMPARISON:  CT AP, 11/12/2012 and 08/10/2008. Chest XR and KUB, concurrent. FINDINGS: Lower chest: Trace LEFT pleural effusion with adjacent dependent patchy pulmonary consolidation. Hepatobiliary: Gross nodular contour liver. No focal abnormality. Cholecystectomy. No biliary dilatation. Pancreas: No pancreatic ductal dilatation or surrounding inflammatory changes. Spleen: Splenomegaly, measuring up to 17.0 cm Adrenals/Urinary Tract: Adrenal glands are unremarkable. Kidneys are normal, without renal calculi, focal lesion, or hydronephrosis. Bladder is decompressed by Foley catheter. Stomach/Bowel: Stomach is within normal limits. Enteric decompression tube, with tip within the gastric body. Appendix appears normal. No evidence of bowel wall thickening, distention, or inflammatory changes. Vascular/Lymphatic: Aortic atherosclerosis without aneurysmal dilatation. No enlarged abdominal or pelvic lymph nodes. Reproductive: Hysterectomy.  No adnexal mass. Other: No abdominal wall  hernia or abnormality. Trace mesenteric stranding. Small volume perihepatic, perisplenic, LEFT lower quadrant and deep pelvic ascites. Musculoskeletal: Mild body wall edema. No acute or significant osseous findings. IMPRESSION: Suboptimal evaluation, secondary to a lack of intravenous contrast. 1. No acute abdominopelvic process. 2. Cirrhosis with portal hypertension, as evidenced by splenomegaly. Small volume of abdominopelvic ascites. 3. Trace LEFT pleural effusion, with adjacent pulmonary consolidation. This may represent atelectasis, though early pneumonia can appear similar. Electronically Signed   By: Roanna Banning M.D.   On: 01/26/2024 11:20   CT HEAD WO CONTRAST ( ) Result Date: 01/26/2024 CLINICAL DATA:  Mental status change, unknown cause; Neck trauma, focal neuro deficit or paresthesia (Age 3-64y) EXAM: CT HEAD WITHOUT CONTRAST CT CERVICAL SPINE WITHOUT CONTRAST TECHNIQUE: Multidetector CT imaging of the head and cervical spine was performed following the standard protocol without intravenous contrast. Multiplanar CT image reconstructions of the cervical spine were also generated. RADIATION DOSE REDUCTION: This exam was performed according to the departmental dose-optimization program which includes automated exposure control, adjustment of the mA and/or kV according to patient size and/or use of iterative reconstruction technique. COMPARISON:  None Available. FINDINGS: CT HEAD FINDINGS Brain: No hemorrhage. No hydrocephalus. No extra-axial fluid collection. No mass effect. No mass lesion. 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 Vascular: No hyperdense vessel or unexpected calcification. Skull: Soft tissue swelling along the left frontal  scalp. No evidence of an underlying calvarial fracture. Sinuses/Orbits: No middle ear or mastoid effusion. Paranasal sinuses are clear. Orbits are unremarkable Other: None. CT CERVICAL SPINE FINDINGS Alignment: Normal. Skull base and vertebrae: No  acute fracture. No primary bone lesion or focal pathologic process. Soft tissues and spinal canal: No prevertebral fluid or swelling. No visible canal hematoma. Disc levels:  CT evidence of high-grade spinal canal stenosis Upper chest: Negative. Other: Partially imaged endotracheal and enteric tubes in place. IMPRESSION: 1. No CT evidence of intracranial injury. 2. 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 If there is clinical concern for an acute infarct, consider further evaluation with MRI. 3. Soft tissue swelling along the left frontal scalp. No evidence of an underlying calvarial fracture. 4. No acute fracture or traumatic subluxation of the cervical spine. Electronically Signed   By: Lorenza Cambridge M.D.   On: 01/26/2024 10:46   CT Cervical Spine Wo Contrast Result Date: 01/26/2024 CLINICAL DATA:  Mental status change, unknown cause; Neck trauma, focal neuro deficit or paresthesia (Age 22-64y) EXAM: CT HEAD WITHOUT CONTRAST CT CERVICAL SPINE WITHOUT CONTRAST TECHNIQUE: Multidetector CT imaging of the head and cervical spine was performed following the standard protocol without intravenous contrast. Multiplanar CT image reconstructions of the cervical spine were also generated. RADIATION DOSE REDUCTION: This exam was performed according to the departmental dose-optimization program which includes automated exposure control, adjustment of the mA and/or kV according to patient size and/or use of iterative reconstruction technique. COMPARISON:  None Available. FINDINGS: CT HEAD FINDINGS Brain: No hemorrhage. No hydrocephalus. No extra-axial fluid collection. No mass effect. No mass lesion. 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 Vascular: No hyperdense vessel or unexpected calcification. Skull: Soft tissue swelling along the left frontal scalp. No evidence of an underlying calvarial fracture. Sinuses/Orbits: No middle ear or mastoid effusion. Paranasal sinuses are clear. Orbits are unremarkable  Other: None. CT CERVICAL SPINE FINDINGS Alignment: Normal. Skull base and vertebrae: No acute fracture. No primary bone lesion or focal pathologic process. Soft tissues and spinal canal: No prevertebral fluid or swelling. No visible canal hematoma. Disc levels:  CT evidence of high-grade spinal canal stenosis Upper chest: Negative. Other: Partially imaged endotracheal and enteric tubes in place. IMPRESSION: 1. No CT evidence of intracranial injury. 2. 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 If there is clinical concern for an acute infarct, consider further evaluation with MRI. 3. Soft tissue swelling along the left frontal scalp. No evidence of an underlying calvarial fracture. 4. No acute fracture or traumatic subluxation of the cervical spine. Electronically Signed   By: Lorenza Cambridge M.D.   On: 01/26/2024 10:46   DG Chest Port 1 View Result Date: 01/26/2024 CLINICAL DATA:  Sepsis. EXAM: PORTABLE CHEST 1 VIEW COMPARISON:  Chest radiograph dated 07/20/2021. FINDINGS: Shallow inspiration with minimal bibasilar atelectasis. No focal consolidation, pleural effusion, or pneumothorax. Mild cardiomegaly with mild central vascular congestion. No acute osseous pathology. IMPRESSION: Mild cardiomegaly with mild central vascular congestion. No focal consolidation. Electronically Signed   By: Elgie Collard M.D.   On: 01/26/2024 10:14    ECHO as above  TELEMETRY reviewed by me 02/02/2024: sinus rhythm rate 70s  EKG reviewed by me: Sinus rhythm rate 82 bpm  Data reviewed by me 02/02/2024: last 24h vitals tele labs imaging I/O PCCM notes, hospitalist progress note  Principal Problem:   Septic shock (HCC)    ASSESSMENT AND PLAN:  Abigail Molina is a 58 y.o. female  with a past medical history of hypertension, hyperlipidemia,  type 2 diabetes, anemia, endometrial cancer, OSA, GERD, NASH who presented to the ED on 01/26/2024 for unresponsiveness, found down by family and EMS brought her to the ED.  Troponins  elevated.  Cardiology was consulted for further evaluation.   # NSTEMI, type I vs type II # Septic shock # Acute metabolic encephalopathy # Anemia Patient brought to the ED from home after being found unresponsive and hypotensive. Intubated in the ED. Placed on pressors and treated for septic shock, found to be bacteremic. Extubated today 02/02/24. Troponins trended 179 > 591 > 886 > 1349 > 2680 > 4087 > 3717. Echo this admission with preserved EF. Patient with intermittent CP for 6 months, has denied any chest pain throughout her hospitalization. -S/p blood transfusion 2/27, hemoglobin down to 7.5 on a.m. labs today.  Plan was for heart catheterization today but given anemia will postpone at this time.  Hemoglobin will need to stabilize before we will plan to proceed with catheterization thus will likely plan to pursue this outpatient.  Proceeding at this time would not be ideal as should she need intervention she would then require to be heparinized in the lab and be placed on DAPT. -Given bacteremia, heart cath initially deferred. See further recommendations above. -Continue aspirin 81 mg daily.  Continue Crestor 20 mg daily.   This patient's plan of care was discussed and created with Dr. Juliann Pares and he is in agreement.  Signed: Gale Journey, PA-C  02/02/2024, 8:24 AM [facility] Card[facility]

## 2024-02-03 ENCOUNTER — Encounter: Payer: Self-pay | Admitting: Pulmonary Disease

## 2024-02-03 DIAGNOSIS — A419 Sepsis, unspecified organism: Secondary | ICD-10-CM | POA: Insufficient documentation

## 2024-02-03 DIAGNOSIS — E871 Hypo-osmolality and hyponatremia: Secondary | ICD-10-CM | POA: Insufficient documentation

## 2024-02-03 DIAGNOSIS — J9601 Acute respiratory failure with hypoxia: Secondary | ICD-10-CM

## 2024-02-03 DIAGNOSIS — G9341 Metabolic encephalopathy: Secondary | ICD-10-CM | POA: Diagnosis not present

## 2024-02-03 DIAGNOSIS — I214 Non-ST elevation (NSTEMI) myocardial infarction: Secondary | ICD-10-CM | POA: Diagnosis not present

## 2024-02-03 DIAGNOSIS — L8990 Pressure ulcer of unspecified site, unspecified stage: Secondary | ICD-10-CM | POA: Insufficient documentation

## 2024-02-03 HISTORY — DX: Non-ST elevation (NSTEMI) myocardial infarction: I21.4

## 2024-02-03 LAB — GLUCOSE, CAPILLARY
Glucose-Capillary: 105 mg/dL — ABNORMAL HIGH (ref 70–99)
Glucose-Capillary: 118 mg/dL — ABNORMAL HIGH (ref 70–99)
Glucose-Capillary: 172 mg/dL — ABNORMAL HIGH (ref 70–99)
Glucose-Capillary: 210 mg/dL — ABNORMAL HIGH (ref 70–99)
Glucose-Capillary: 229 mg/dL — ABNORMAL HIGH (ref 70–99)
Glucose-Capillary: 93 mg/dL (ref 70–99)

## 2024-02-03 LAB — IRON AND TIBC
Iron: 31 ug/dL (ref 28–170)
Saturation Ratios: 11 % (ref 10.4–31.8)
TIBC: 294 ug/dL (ref 250–450)
UIBC: 263 ug/dL

## 2024-02-03 LAB — FERRITIN: Ferritin: 38 ng/mL (ref 11–307)

## 2024-02-03 LAB — BASIC METABOLIC PANEL
Anion gap: 9 (ref 5–15)
BUN: 26 mg/dL — ABNORMAL HIGH (ref 6–20)
CO2: 22 mmol/L (ref 22–32)
Calcium: 9.3 mg/dL (ref 8.9–10.3)
Chloride: 102 mmol/L (ref 98–111)
Creatinine, Ser: 0.95 mg/dL (ref 0.44–1.00)
GFR, Estimated: >60 mL/min (ref >60)
Glucose, Bld: 106 mg/dL — ABNORMAL HIGH (ref 70–99)
Potassium: 3.8 mmol/L (ref 3.5–5.1)
Sodium: 133 mmol/L — ABNORMAL LOW (ref 135–145)

## 2024-02-03 LAB — CBC
HCT: 27.3 % — ABNORMAL LOW (ref 36.0–46.0)
Hemoglobin: 8.5 g/dL — ABNORMAL LOW (ref 12.0–15.0)
MCH: 25.8 pg — ABNORMAL LOW (ref 26.0–34.0)
MCHC: 31.1 g/dL (ref 30.0–36.0)
MCV: 83 fL (ref 80.0–100.0)
Platelets: 147 10*3/uL — ABNORMAL LOW (ref 150–400)
RBC: 3.29 MIL/uL — ABNORMAL LOW (ref 3.87–5.11)
RDW: 18.9 % — ABNORMAL HIGH (ref 11.5–15.5)
WBC: 9.2 10*3/uL (ref 4.0–10.5)
nRBC: 0 % (ref 0.0–0.2)

## 2024-02-03 MED ORDER — FUROSEMIDE 40 MG PO TABS
40.0000 mg | ORAL_TABLET | Freq: Two times a day (BID) | ORAL | Status: DC
  Administered 2024-02-03 – 2024-02-08 (×11): 40 mg via ORAL
  Filled 2024-02-03 (×11): qty 1

## 2024-02-03 NOTE — Hospital Course (Addendum)
 58 y.o. female admitted with Acute Metabolic Encephalopathy and Severe Sepsis with Septic Shock in the setting of Klebsiella Oxytoca Bacteremia due to UTI and suspected pneumonia, along with NSTEMI, Acute Kidney Injury and Hyponatremia. Required intubation and mechanical ventilation, extubated 2/26.   Patient was In the hospital while waiting for nursing home placement.  However, patient has made significant progress, she no longer want nursing home, will set up home care.

## 2024-02-03 NOTE — Progress Notes (Signed)
 [facility] [place] CLINIC CARDIOLOGY PROGRESS NOTE       Patient ID: [name] MRN: [id number] DOB/AGE: 1966-07-13 58 y.o.  Admit date: 01/26/2024 Referring Physician Harlon Ditty, NP Primary Physician Care, [place] [facility]  Primary Cardiologist None Reason for Consultation NSTEMI  HPI: Abigail Molina is a 58 y.o. female  with a past medical history of hypertension, hyperlipidemia, type 2 diabetes, anemia, endometrial cancer, OSA, GERD, NASH who presented to the ED on 01/26/2024 for unresponsiveness, found down by family and EMS brought her to the ED.  Troponins elevated.  Cardiology was consulted for further evaluation.   Interval history: -Patient seen and examined this morning, resting comfortably in bed. -Denies any chest pain or shortness of breath symptoms. On room air. -Feeling well overall without any complaints. -Worked with PT yesterday which she tolerated well.  Review of systems complete and found to be negative unless listed above    Past Medical History:  Diagnosis Date   Anemia    Anxiety    Arthritis    Cancer (HCC)    Endometrial cancer   Chronic pain    Cirrhosis of liver (HCC)    Depression    Diabetes mellitus without complication (HCC)    Fatty liver disease, nonalcoholic 2021   GERD (gastroesophageal reflux disease)    Glaucoma    Hyperlipidemia    Hypertension    Meningioma (HCC)    OSA (obstructive sleep apnea) 05/08/2023   Overactive bladder    Pharmacologic therapy 11/02/2023   Psoriasis 2015    Past Surgical History:  Procedure Laterality Date   ABDOMINAL HYSTERECTOMY  2007   CHOLECYSTECTOMY  2000    Medications Prior to Admission  Medication Sig Dispense Refill Last Dose/Taking   aspirin 81 MG tablet Take 81 mg by mouth daily.   01/25/2024   Baclofen 5 MG TABS Take 2 tablets by mouth at bedtime.   01/25/2024   buPROPion (WELLBUTRIN XL) 150 MG 24 hr tablet Take 1 tablet (150 mg total) by mouth daily. 30 tablet 3 01/25/2024   canagliflozin (INVOKANA)  300 MG TABS tablet Take by mouth.   01/25/2024   carvedilol (COREG) 6.25 MG tablet Take 6.25 mg by mouth 2 (two) times daily.   01/25/2024   citalopram (CELEXA) 20 MG tablet Take 20 mg by mouth daily.   01/25/2024   Continuous Glucose Sensor (FREESTYLE LIBRE 3 SENSOR) MISC by Does not apply route daily.   01/26/2024   COSENTYX SENSOREADY, 300 MG, 150 MG/ML SOAJ Inject into the skin.   Taking   Ferrous Sulfate (SLOW FE) 142 (45 Fe) MG TBCR Take by mouth.   01/26/2024   furosemide (LASIX) 40 MG tablet Take 40 mg by mouth 2 (two) times daily.   01/25/2024   gabapentin (NEURONTIN) 300 MG capsule Take by mouth once.   01/25/2024   insulin lispro (HUMALOG) 100 UNIT/ML KwikPen Inject into the skin.   01/25/2024   lactulose (CHRONULAC) 10 GM/15ML solution SMARTSIG:Milliliter(s) By Mouth   Past Week   LANTUS SOLOSTAR 100 UNIT/ML Solostar Pen Inject 66 Units into the skin daily.   01/25/2024   levETIRAcetam (KEPPRA) 500 MG tablet Take 750 mg by mouth 2 (two) times daily.   Taking   MAGNESIUM-OXIDE 400 (240 Mg) MG tablet Take 2 tablets by mouth 2 (two) times daily.   01/25/2024   metFORMIN (GLUCOPHAGE-XR) 500 MG 24 hr tablet Take 1,000 mg by mouth 2 (two) times daily.   01/25/2024   mirtazapine (REMERON) 15 MG tablet Take  1 tablet (15 mg total) by mouth at bedtime. 30 tablet 0 01/25/2024   ondansetron (ZOFRAN-ODT) 4 MG disintegrating tablet Dissolve 1 tablet (4 mg total) in the mouth every twelve (12) hours as needed for nausea.   Past Week   pantoprazole (PROTONIX) 40 MG tablet TAKE ONE TABLET BY MOUTH EVERY DAY 90 tablet 0 01/25/2024   promethazine (PHENERGAN) 12.5 MG tablet Take by mouth.   Taking   scopolamine (TRANSDERM-SCOP) 1 MG/3DAYS Place 1 patch onto the skin every 3 (three) days.   Taking   spironolactone (ALDACTONE) 100 MG tablet Take 100 mg by mouth 2 (two) times daily.   01/25/2024   XIFAXAN 550 MG TABS tablet Take 550 mg by mouth 2 (two) times daily.   01/25/2024   zolpidem (AMBIEN) 5 MG tablet Take 1  tablet (5 mg total) by mouth at bedtime as needed for sleep. 30 tablet 1 Past Week   Accu-Chek Softclix Lancets lancets SMARTSIG:Topical      BD PEN NEEDLE NANO 2ND GEN 32G X 4 MM MISC USE 1 NIGHTLY      chlorhexidine (PERIDEX) 0.12 % solution SMARTSIG:0.5 Capful(s) By Mouth Morning-Evening (Patient not taking: Reported on 01/26/2024)   Not Taking   clotrimazole (MYCELEX) 10 MG troche Take 10 mg by mouth 5 (five) times daily. (Patient not taking: Reported on 01/26/2024)   Not Taking   Continuous Glucose Receiver (DEXCOM G7 RECEIVER) DEVI USE AS INSTRUCTED WITH G7 SENSORS (Patient not taking: Reported on 01/26/2024)   Not Taking   Continuous Glucose Sensor (DEXCOM G7 SENSOR) MISC USE TO MONITOR BLOOD GLUCOSE LEVELS CONTINUOUSLY. CHANGE SENSOR EVERY 10 DAYS. (Patient not taking: Reported on 01/26/2024)   Not Taking   Dulaglutide (TRULICITY) 1.5 MG/0.5ML SOPN Inject 1.5 mg into the skin once a week. Thursdays (Patient not taking: Reported on 01/26/2024)   Not Taking   insulin glargine-yfgn (SEMGLEE, YFGN,) 100 UNIT/ML Pen INJECT 50 UNITS UNDER THE SKIN NIGHTLY AS DIRECTED BY YOUR DOCTOR AROUND SAME TIME EVERY DAY. TOTAL DAILY DOSE NOT TO EXCEED 50 UNITS (Patient not taking: Reported on 01/26/2024)   Not Taking   Insulin Pen Needle 32G X 6 MM MISC 1 Syringe by Does not apply route. Use with Victoza.      naltrexone (DEPADE) 50 MG tablet Take 50 mg by mouth daily.      Secukinumab (COSENTYX SENSOREADY PEN) 150 MG/ML SOAJ Inject the contents of 2 pens (300 mg total) under the skin once a week at weeks 0, 1, 2, 3, and 4. THEN inject the contents of 2 pens (300 mg total) every 4 weeks thereafter. (Patient not taking: Reported on 01/26/2024)   Not Taking   Sharps Container (BD SHARPS COLLECTOR) MISC Use as directed to dispose of Cosentyx pens.      Social History   Socioeconomic History   Marital status: Widowed    Spouse name: Not on file   Number of children: Not on file   Years of education: Not on file    Highest education level: Associate degree: academic program  Occupational History   Not on file  Tobacco Use   Smoking status: Never   Smokeless tobacco: Never  Vaping Use   Vaping status: Never Used  Substance and Sexual Activity   Alcohol use: Not Currently    Comment:  Rarely. One or two drinks a year    Drug use: No   Sexual activity: Not Currently    Birth control/protection: Abstinence  Other Topics Concern   Not  on file  Social History Narrative   PT is not working right now. Pt worries about financial needs since her husband died in 05-21-2024 and she has no income. She applied for Widow's benefits in July but has not heard any information and the application is still pending. If possible please inquire about application status.     Social Drivers of Corporate investment banker Strain: Low Risk  (12/16/2023)   Received from [facility]   Overall Financial Resource Strain (CARDIA)    Difficulty of Paying Living Expenses: Not very hard  Food Insecurity: Patient Unable To Answer (01/26/2024)   Hunger Vital Sign    Worried About Running Out of Food in the Last Year: Patient unable to answer    Ran Out of Food in the Last Year: Patient unable to answer  Transportation Needs: Patient Unable To Answer (01/26/2024)   PRAPARE - Transportation    Lack of Transportation (Medical): Patient unable to answer    Lack of Transportation (Non-Medical): Patient unable to answer  Physical Activity: Insufficiently Active (01/09/2021)   Received from [facility], [facility]   Exercise Vital Sign    Days of Exercise per Week: 1 day    Minutes of Exercise per Session: 40 min  Stress: Stress Concern Present (09/17/2022)   Received from [facility], [facility] Occupational [facility] - Occupational Stress Questionnaire    Feeling of Stress : Very much  Social Connections: Moderately Isolated (09/02/2018)   Social Connection and Isolation Panel [NHANES]     Frequency of Communication with Friends and Family: Once a week    Frequency of Social Gatherings with Friends and Family: Twice a week    Attends Religious Services: Never    Database administrator or Organizations: No    Attends Banker Meetings: Never    Marital Status: Widowed  Intimate Partner Violence: Patient Unable To Answer (01/26/2024)   Humiliation, Afraid, Rape, and Kick questionnaire    Fear of Current or Ex-Partner: Patient unable to answer    Emotionally Abused: Patient unable to answer    Physically Abused: Patient unable to answer    Sexually Abused: Patient unable to answer    Family History  Problem Relation Age of Onset   Diabetes Mother    Hypertension Mother    Depression Mother    Obesity Mother    Vision loss Mother    Alcohol abuse Cousin    Bipolar disorder Cousin    Heart disease Maternal Grandfather    Alcohol abuse Maternal Grandmother    Stroke Paternal Grandfather      Vitals:   02/02/24 2110 02/03/24 0405 02/03/24 0500 02/03/24 0746  BP: (!) 119/52 126/75  123/69  Pulse: 91 93  90  Resp: 18 16  18   Temp: 98.2 F (36.8 C) 97.9 F (36.6 C)  98 F (36.7 C)  TempSrc:  Oral    SpO2: 100% 96%  99%  Weight:   93.3 kg   Height:        PHYSICAL EXAM General: Well appearing female, well nourished, in no acute distress. HEENT: Normocephalic and atraumatic. Neck: No JVD.  Lungs: Normal respiratory effort on room air. Clear bilaterally to auscultation. No wheezes, crackles, rhonchi.  Heart: HRRR. Normal S1 and S2 without gallops or murmurs.  Abdomen: Non-distended appearing.  Msk: Normal strength and tone for age. Extremities: Warm and well perfused. No clubbing, cyanosis. No edema.  Neuro: Alert and oriented X 3. Psych: Answers questions appropriately.   Labs: Basic Metabolic Panel: Recent Labs    02/01/24 0420 02/02/24 0550 02/03/24 0510  NA 133* 134* 133*  K 3.9 3.8 3.8  CL 99 104 102  CO2 26 24 22   GLUCOSE 90 145*  106*  BUN 32* 30* 26*  CREATININE 0.81 0.84 0.95  CALCIUM 9.4 9.2 9.3  PHOS 1.7*  --   --    Liver Function Tests: Recent Labs    02/01/24 0420  ALBUMIN 2.3*   No results for input(s): "LIPASE", "AMYLASE" in the last 72 hours. CBC: Recent Labs    02/02/24 0550 02/03/24 0510  WBC 8.0 9.2  HGB 7.5* 8.5*  HCT 23.8* 27.3*  MCV 82.9 83.0  PLT 135* 147*   Cardiac Enzymes: No results for input(s): "CKTOTAL", "CKMB", "CKMBINDEX", "TROPONINIHS" in the last 72 hours.  BNP: No results for input(s): "BNP" in the last 72 hours.  D-Dimer: No results for input(s): "DDIMER" in the last 72 hours. Hemoglobin A1C: No results for input(s): "HGBA1C" in the last 72 hours.  Fasting Lipid Panel: No results for input(s): "CHOL", "HDL", "LDLCALC", "TRIG", "CHOLHDL", "LDLDIRECT" in the last 72 hours. Thyroid Function Tests: No results for input(s): "TSH", "T4TOTAL", "T3FREE", "THYROIDAB" in the last 72 hours.  Invalid input(s): "FREET3" Anemia Panel: No results for input(s): "VITAMINB12", "FOLATE", "FERRITIN", "TIBC", "IRON", "RETICCTPCT" in the last 72 hours.   Radiology: EEG adult Result Date: 01/26/2024 Charlsie Quest, MD     01/26/2024  4:46 PM Patient Name: RECHELLE NIEBLA MRN: [id number] Epilepsy Attending: Charlsie Quest Referring Physician/Provider: Ezequiel Essex, NP Date: 01/26/2024 Duration: 32.31 mins Patient history: 58yo female with ams. EEG to evaluate for seizure Level of alertness: comatose AEDs during EEG study: LEV Technical aspects: This EEG study was done with scalp electrodes positioned according to the 10-20 International system of electrode placement. Electrical activity was reviewed with band pass filter of 1-70Hz , sensitivity of 7 uV/mm, display speed of 21mm/sec with a 60Hz  notched filter applied as appropriate. EEG data were recorded continuously and digitally stored.  Video monitoring was available and reviewed as appropriate. Description: EEG showed continuous  generalized 3 to 5 Hz theta-delta slowing. Hyperventilation and photic stimulation were not performed.   ABNORMALITY - Continuous slow, generalized IMPRESSION: This study is suggestive of severe diffuse encephalopathy. No seizures or epileptiform discharges were seen throughout the recording. Charlsie Quest   ECHOCARDIOGRAM COMPLETE Result Date: 01/26/2024    ECHOCARDIOGRAM REPORT   Patient Name:   KAMRYN MESSINEO Date of Exam: 01/26/2024 Medical Rec #:  [id number]    Height:       64.0 in Accession #:    [id number]   Weight:       212.0 lb Date of Birth:  05-08-1966    BSA:          2.005 m Patient Age:    57 years     BP:           85/65 mmHg Patient Gender: F            HR:           Not listed in chart bpm. Exam Location:  [facility] Procedure: 2D Echo, Cardiac Doppler and Color Doppler (Both Spectral and Color            Flow Doppler were utilized during procedure). Indications:     Abnormal ECG R94.31  History:  Patient has no prior history of Echocardiogram examinations.                  Risk Factors:Hypertension and Dyslipidemia. Anxiety.  Sonographer:     Cristela Blue Referring Phys:  [id number] Ezequiel Essex Diagnosing Phys: Yvonne Kendall MD  Sonographer Comments: Echo performed with patient supine and on artificial respirator and no parasternal window. Zoll pad over sternum. IMPRESSIONS  1. Left ventricular ejection fraction, by estimation, is >55%. The left ventricle has normal function. Left ventricular endocardial border not optimally defined to evaluate regional wall motion. Left ventricular diastolic parameters were normal.  2. Right ventricular systolic function is normal. The right ventricular size is normal. Tricuspid regurgitation signal is inadequate for assessing PA pressure.  3. The mitral valve is normal in structure. No evidence of mitral valve regurgitation. No evidence of mitral stenosis.  4. The aortic valve was not well visualized. Aortic valve regurgitation is not visualized. No aortic  stenosis is present. FINDINGS  Left Ventricle: Left ventricular ejection fraction, by estimation, is >55%. The left ventricle has normal function. Left ventricular endocardial border not optimally defined to evaluate regional wall motion. Strain imaging was not performed. The left ventricular internal cavity size was normal in size. There is borderline left ventricular hypertrophy. Left ventricular diastolic parameters were normal. Right Ventricle: The right ventricular size is normal. No increase in right ventricular wall thickness. Right ventricular systolic function is normal. Tricuspid regurgitation signal is inadequate for assessing PA pressure. Left Atrium: Left atrial size was normal in size. Right Atrium: Right atrial size was normal in size. Pericardium: There is no evidence of pericardial effusion. Mitral Valve: The mitral valve is normal in structure. No evidence of mitral valve regurgitation. No evidence of mitral valve stenosis. MV peak gradient, 13.0 mmHg. The mean mitral valve gradient is 4.0 mmHg. Tricuspid Valve: The tricuspid valve is normal in structure. Tricuspid valve regurgitation is trivial. Aortic Valve: The aortic valve was not well visualized. Aortic valve regurgitation is not visualized. No aortic stenosis is present. Aortic valve mean gradient measures 3.0 mmHg. Aortic valve peak gradient measures 5.3 mmHg. Aortic valve area, by VTI measures 3.79 cm. Pulmonic Valve: The pulmonic valve was not well visualized. Pulmonic valve regurgitation is not visualized. No evidence of pulmonic stenosis. Aorta: The aortic root is normal in size and structure. Pulmonary Artery: The pulmonary artery is not well seen. Venous: IVC assessment for right atrial pressure unable to be performed due to mechanical ventilation. IAS/Shunts: No atrial level shunt detected by color flow Doppler. Additional Comments: 3D imaging was not performed.  LEFT VENTRICLE PLAX 2D LVIDd:         3.50 cm   Diastology LVIDs:          2.60 cm   LV e' medial:    12.90 cm/s LV PW:         1.10 cm   LV E/e' medial:  6.0 LV IVS:        1.00 cm   LV e' lateral:   16.90 cm/s LVOT diam:     2.00 cm   LV E/e' lateral: 4.6 LV SV:         63 LV SV Index:   31 LVOT Area:     3.14 cm  RIGHT VENTRICLE RV Basal diam:  3.40 cm RV Mid diam:    3.50 cm RV S prime:     15.30 cm/s TAPSE (M-mode): 2.3 cm LEFT ATRIUM  Index        RIGHT ATRIUM           Index LA diam:      3.30 cm 1.65 cm/m   RA Area:     15.10 cm LA Vol (A2C): 19.1 ml 9.52 ml/m   RA Volume:   33.10 ml  16.51 ml/m LA Vol (A4C): 26.1 ml 13.01 ml/m  AORTIC VALVE AV Area (Vmax):    2.92 cm AV Area (Vmean):   3.13 cm AV Area (VTI):     3.79 cm AV Vmax:           115.00 cm/s AV Vmean:          85.900 cm/s AV VTI:            0.165 m AV Peak Grad:      5.3 mmHg AV Mean Grad:      3.0 mmHg LVOT Vmax:         107.00 cm/s LVOT Vmean:        85.600 cm/s LVOT VTI:          0.199 m LVOT/AV VTI ratio: 1.21  AORTA Ao Root diam: 3.10 cm MITRAL VALVE MV Area (PHT): 5.75 cm    SHUNTS MV Area VTI:   2.10 cm    Systemic VTI:  0.20 m MV Peak grad:  13.0 mmHg   Systemic Diam: 2.00 cm MV Mean grad:  4.0 mmHg MV Vmax:       1.80 m/s MV Vmean:      88.2 cm/s MV Decel Time: 132 msec MV E velocity: 77.60 cm/s MV A velocity: 93.80 cm/s MV E/A ratio:  0.83 Cristal Deer End MD Electronically signed by Yvonne Kendall MD Signature Date/Time: 01/26/2024/4:06:41 PM    Final    DG Chest Port 1 View Result Date: 01/26/2024 CLINICAL DATA:  Central line readjustment. EXAM: PORTABLE CHEST 1 VIEW COMPARISON:  Chest x-ray from same day at 1247 hours. FINDINGS: The patient remains rotated to the left. Interval retraction of the left internal jugular central venous catheter with the tip now in the distal SVC. Unchanged endotracheal and enteric tubes. Unchanged cardiomegaly and left retrocardiac opacity. No pleural effusion or pneumothorax. No acute osseous abnormality. IMPRESSION: 1. Interval retraction of the left  internal jugular central venous catheter with the tip now in the distal SVC. Electronically Signed   By: Obie Dredge M.D.   On: 01/26/2024 14:11   DG Chest Port 1 View Result Date: 01/26/2024 CLINICAL DATA:  Central line placement. EXAM: PORTABLE CHEST 1 VIEW COMPARISON:  Chest x-ray from same day at 0950 hours. FINDINGS: The patient is rotated to the left. New left internal jugular central venous catheter with tip in the proximal right atrium. Unchanged endotracheal and enteric tubes. Unchanged cardiomegaly. Unchanged retrocardiac left lower lobe density. Clear right lung. No large pleural effusion or pneumothorax. No acute osseous abnormality. IMPRESSION: 1. New left internal jugular central venous catheter with tip in the proximal right atrium. No pneumothorax. 2. Unchanged retrocardiac left lower lobe atelectasis or pneumonia. Electronically Signed   By: Obie Dredge M.D.   On: 01/26/2024 14:10   DG Abdomen 1 View Result Date: 01/26/2024 CLINICAL DATA:  post og tube placement EXAM: ABDOMEN - 1 VIEW COMPARISON:  Chest XR and CT AP, concurrent. FINDINGS: Support lines: Enteric decompression tube, with tube tip and side port within stomach. The imaged bowel gas pattern is normal. Cholecystectomy clips. No radio-opaque calculi or other significant radiographic abnormality. IMPRESSION: 1. NG tube,  well-positioned with tip and side port within the stomach. 2. Nonobstructed bowel-gas pattern. Electronically Signed   By: Roanna Banning M.D.   On: 01/26/2024 11:44   DG Chest Portable 1 View Result Date: 01/26/2024 CLINICAL DATA:  post intubation EXAM: PORTABLE CHEST 1 VIEW COMPARISON:  Chest XR, 01/26/2024.  CT AP, concurrent. FINDINGS: Support lines: Intubation with ETT within the distal thoracic trachea, 3 cm from carina. Enteric decompression tube with tip extending outside field. Cardiomegaly. Aortic arch atherosclerosis. Mediastinum is within normal limits given technique and patient rotation. The  RIGHT lung is clear. Patchy retrocardiac opacity. No large pleural effusion or pneumothorax. No acute displaced fracture. IMPRESSION: 1. Intubation with ETT well-positioned at the distal thoracic trachea. Additional lines and tubes, as above. 2. Cardiomegaly and Aortic Atherosclerosis (ICD10-I70.0). 3. Retrocardiac opacity is likely to represent atelectasis, however early pneumonia can appear similar. Electronically Signed   By: Roanna Banning M.D.   On: 01/26/2024 11:41   CT ABDOMEN PELVIS WO CONTRAST Result Date: 01/26/2024 CLINICAL DATA:  Sepsis EXAM: CT ABDOMEN AND PELVIS WITHOUT CONTRAST TECHNIQUE: Multidetector CT imaging of the abdomen and pelvis was performed following the standard protocol without IV contrast. RADIATION DOSE REDUCTION: This exam was performed according to the departmental dose-optimization program which includes automated exposure control, adjustment of the mA and/or kV according to patient size and/or use of iterative reconstruction technique. COMPARISON:  CT AP, 11/12/2012 and 08/10/2008. Chest XR and KUB, concurrent. FINDINGS: Lower chest: Trace LEFT pleural effusion with adjacent dependent patchy pulmonary consolidation. Hepatobiliary: Gross nodular contour liver. No focal abnormality. Cholecystectomy. No biliary dilatation. Pancreas: No pancreatic ductal dilatation or surrounding inflammatory changes. Spleen: Splenomegaly, measuring up to 17.0 cm Adrenals/Urinary Tract: Adrenal glands are unremarkable. Kidneys are normal, without renal calculi, focal lesion, or hydronephrosis. Bladder is decompressed by Foley catheter. Stomach/Bowel: Stomach is within normal limits. Enteric decompression tube, with tip within the gastric body. Appendix appears normal. No evidence of bowel wall thickening, distention, or inflammatory changes. Vascular/Lymphatic: Aortic atherosclerosis without aneurysmal dilatation. No enlarged abdominal or pelvic lymph nodes. Reproductive: Hysterectomy.  No adnexal  mass. Other: No abdominal wall hernia or abnormality. Trace mesenteric stranding. Small volume perihepatic, perisplenic, LEFT lower quadrant and deep pelvic ascites. Musculoskeletal: Mild body wall edema. No acute or significant osseous findings. IMPRESSION: Suboptimal evaluation, secondary to a lack of intravenous contrast. 1. No acute abdominopelvic process. 2. Cirrhosis with portal hypertension, as evidenced by splenomegaly. Small volume of abdominopelvic ascites. 3. Trace LEFT pleural effusion, with adjacent pulmonary consolidation. This may represent atelectasis, though early pneumonia can appear similar. Electronically Signed   By: Roanna Banning M.D.   On: 01/26/2024 11:20   CT HEAD WO CONTRAST ( ) Result Date: 01/26/2024 CLINICAL DATA:  Mental status change, unknown cause; Neck trauma, focal neuro deficit or paresthesia (Age 42-64y) EXAM: CT HEAD WITHOUT CONTRAST CT CERVICAL SPINE WITHOUT CONTRAST TECHNIQUE: Multidetector CT imaging of the head and cervical spine was performed following the standard protocol without intravenous contrast. Multiplanar CT image reconstructions of the cervical spine were also generated. RADIATION DOSE REDUCTION: This exam was performed according to the departmental dose-optimization program which includes automated exposure control, adjustment of the mA and/or kV according to patient size and/or use of iterative reconstruction technique. COMPARISON:  None Available. FINDINGS: CT HEAD FINDINGS Brain: No hemorrhage. No hydrocephalus. No extra-axial fluid collection. No mass effect. No mass lesion. 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 Vascular: No hyperdense vessel or unexpected calcification. Skull: Soft tissue swelling along the left frontal  scalp. No evidence of an underlying calvarial fracture. Sinuses/Orbits: No middle ear or mastoid effusion. Paranasal sinuses are clear. Orbits are unremarkable Other: None. CT CERVICAL SPINE FINDINGS Alignment: Normal.  Skull base and vertebrae: No acute fracture. No primary bone lesion or focal pathologic process. Soft tissues and spinal canal: No prevertebral fluid or swelling. No visible canal hematoma. Disc levels:  CT evidence of high-grade spinal canal stenosis Upper chest: Negative. Other: Partially imaged endotracheal and enteric tubes in place. IMPRESSION: 1. No CT evidence of intracranial injury. 2. 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 If there is clinical concern for an acute infarct, consider further evaluation with MRI. 3. Soft tissue swelling along the left frontal scalp. No evidence of an underlying calvarial fracture. 4. No acute fracture or traumatic subluxation of the cervical spine. Electronically Signed   By: Lorenza Cambridge M.D.   On: 01/26/2024 10:46   CT Cervical Spine Wo Contrast Result Date: 01/26/2024 CLINICAL DATA:  Mental status change, unknown cause; Neck trauma, focal neuro deficit or paresthesia (Age 76-64y) EXAM: CT HEAD WITHOUT CONTRAST CT CERVICAL SPINE WITHOUT CONTRAST TECHNIQUE: Multidetector CT imaging of the head and cervical spine was performed following the standard protocol without intravenous contrast. Multiplanar CT image reconstructions of the cervical spine were also generated. RADIATION DOSE REDUCTION: This exam was performed according to the departmental dose-optimization program which includes automated exposure control, adjustment of the mA and/or kV according to patient size and/or use of iterative reconstruction technique. COMPARISON:  None Available. FINDINGS: CT HEAD FINDINGS Brain: No hemorrhage. No hydrocephalus. No extra-axial fluid collection. No mass effect. No mass lesion. 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 Vascular: No hyperdense vessel or unexpected calcification. Skull: Soft tissue swelling along the left frontal scalp. No evidence of an underlying calvarial fracture. Sinuses/Orbits: No middle ear or mastoid effusion. Paranasal sinuses are  clear. Orbits are unremarkable Other: None. CT CERVICAL SPINE FINDINGS Alignment: Normal. Skull base and vertebrae: No acute fracture. No primary bone lesion or focal pathologic process. Soft tissues and spinal canal: No prevertebral fluid or swelling. No visible canal hematoma. Disc levels:  CT evidence of high-grade spinal canal stenosis Upper chest: Negative. Other: Partially imaged endotracheal and enteric tubes in place. IMPRESSION: 1. No CT evidence of intracranial injury. 2. 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 If there is clinical concern for an acute infarct, consider further evaluation with MRI. 3. Soft tissue swelling along the left frontal scalp. No evidence of an underlying calvarial fracture. 4. No acute fracture or traumatic subluxation of the cervical spine. Electronically Signed   By: Lorenza Cambridge M.D.   On: 01/26/2024 10:46   DG Chest Port 1 View Result Date: 01/26/2024 CLINICAL DATA:  Sepsis. EXAM: PORTABLE CHEST 1 VIEW COMPARISON:  Chest radiograph dated 07/20/2021. FINDINGS: Shallow inspiration with minimal bibasilar atelectasis. No focal consolidation, pleural effusion, or pneumothorax. Mild cardiomegaly with mild central vascular congestion. No acute osseous pathology. IMPRESSION: Mild cardiomegaly with mild central vascular congestion. No focal consolidation. Electronically Signed   By: Elgie Collard M.D.   On: 01/26/2024 10:14    ECHO as above  TELEMETRY reviewed by me 02/03/2024: sinus rhythm rate 90s  EKG reviewed by me: Sinus rhythm rate 82 bpm  Data reviewed by me 02/03/2024: last 24h vitals tele labs imaging I/O PCCM notes, hospitalist progress note  Principal Problem:   Septic shock (HCC)    ASSESSMENT AND PLAN:  Abigail Molina is a 58 y.o. female  with a past medical history of hypertension, hyperlipidemia,  type 2 diabetes, anemia, endometrial cancer, OSA, GERD, NASH who presented to the ED on 01/26/2024 for unresponsiveness, found down by family and EMS brought  her to the ED.  Troponins elevated.  Cardiology was consulted for further evaluation.   # NSTEMI, type I vs type II # Septic shock # Acute metabolic encephalopathy # Anemia Patient brought to the ED from home after being found unresponsive and hypotensive. Intubated in the ED. Placed on pressors and treated for septic shock, found to be bacteremic. Extubated today 02/03/24. Troponins trended 179 > 591 > 886 > 1349 > 2680 > 4087 > 3717. Echo this admission with preserved EF. Patient with intermittent CP for 6 months, has denied any chest pain throughout her hospitalization. -Resume home Lasix 40 mg twice daily. -S/p blood transfusion 2/27, hemoglobin down to 7.5 on a.m. labs yesterday.  Plan was for heart catheterization yesterday but given anemia decision was made to postpone.  Hemoglobin will need to stabilize before we will plan to proceed with catheterization thus will likely plan to pursue this outpatient.  Proceeding at this time would not be ideal as should she need intervention she would then require to be heparinized in the lab and be placed on DAPT. -Given bacteremia, heart cath initially deferred. See further recommendations above. -Continue aspirin 81 mg daily.  Continue Crestor 20 mg daily.  Will defer P2Y12 inhibitor given anemia.   This patient's plan of care was discussed and created with Dr. Juliann Pares and he is in agreement.  Signed: Gale Journey, PA-C  02/03/2024, 8:57 AM [facility]

## 2024-02-03 NOTE — Progress Notes (Signed)
 Progress Note   Patient: Abigail Molina MVH:[id number] DOB: Jan 04, 1966 DOA: 01/26/2024     8 DOS: the patient was seen and examined on 02/03/2024   Brief hospital course: 58 y.o. female admitted with Acute Metabolic Encephalopathy and Severe Sepsis with Septic Shock in the setting of Klebsiella Oxytoca Bacteremia due to UTI and suspected pneumonia, along with NSTEMI, Acute Kidney Injury and Hyponatremia. Required intubation and mechanical ventilation, extubated 2/26.      Principal Problem:   Septic shock (HCC) Active Problems:   Acute metabolic encephalopathy   Pressure injury of skin   NSTEMI (non-ST elevated myocardial infarction) (HCC)   Septicemia (HCC)   Acute respiratory failure with hypoxemia (HCC)   Hyponatremia   Assessment and Plan: Acute respiratory failure with hypoxemia requiring mechanical ventilation. Acute metabolic encephalopathy, not toxic encephalopathy. Obstructive sleep apnea. Patient developed acute respiratory failure, most likely multifactorial due to altered mental status, obstruct sleep apnea, septic shock. I do not see a pneumonia on chest x-ray. Condition had improved, currently off oxygen.  #NSTEMI #Intermittent wide complex tachycardia  #Intermittent vtach #Intermittent bradycardia  Hx: HTN and HLD  Echocardiogram showed EF greater than 55% Continue aspirin 81 mg daily, start Crestor 20 mg daily -Hold outpatient antihypertensives and diuretic therapy  Cardiologist planning cardiac catheterization as an outpatient procedure   #Severe Sepsis with septic shock-present on admission due to bacteremia with Klebsiella #Klebsiella Oxytoca BACTEREMIA Klebsiella UTI Patient initially required vasopressor in the ICU, condition has improved. Antibiotics have been adjusted to p.o. Bactrim by infectious disease to complete a total of 2 weeks course for complicated UTI with last dose on 02/08/2024     #Severe hyponatremia ~ IMPROVed #Acute kidney injury  secondary to ATN  ~resolved. #Non anion gap metabolic acidosis ~resolved. #Hyperkalemia ~ RESOLVED #Lactic acidosis resolved. Condition mostly improved.  Sodium 133.    #NASH cirrhosis  #Portal hypertension  Hx: Esophageal varices  Continue outpatient lactulose and rifaximin    #Anemia of chronic disease. Thrombocytopenia secondary to sepsis. She does not have iron deficiency, prior B12 level was elevated.  Appear to be secondary to anemia of chronic disease.  This has been stable.   #Type II diabetes mellitus  Monitor glucose level closely Continue insulin therapy   #Stage II pressure ulcer present on admission  Continue wound care management       Subjective:  Patient doing well today, no short of breath or hypoxia.  Physical Exam: Vitals:   02/02/24 2110 02/03/24 0405 02/03/24 0500 02/03/24 0746  BP: (!) 119/52 126/75  123/69  Pulse: 91 93  90  Resp: 18 16  18   Temp: 98.2 F (36.8 C) 97.9 F (36.6 C)  98 F (36.7 C)  TempSrc:  Oral    SpO2: 100% 96%  99%  Weight:   93.3 kg   Height:       General exam: Appears calm and comfortable  Respiratory system: Clear to auscultation. Respiratory effort normal. Cardiovascular system: S1 & S2 heard, RRR. No JVD, murmurs, rubs, gallops or clicks. No pedal edema. Gastrointestinal system: Abdomen is nondistended, soft and nontender. No organomegaly or masses felt. Normal bowel sounds heard. Central nervous system: Alert and oriented x3. No focal neurological deficits. Extremities: Symmetric 5 x 5 power. Skin: No rashes, lesions or ulcers Psychiatry: Judgement and insight appear normal. Mood & affect appropriate.    Data Reviewed:  Chest x-ray, CT scan results and lab results reviewed.  Family Communication: None  Disposition: Status is: Inpatient Remains inpatient appropriate  because: Unsafe discharge, pending nursing home placement.     Time spent: 55 minutes  Author: Marrion Coy, MD 02/03/2024 3:20  PM  For on call review [email or web address].

## 2024-02-03 NOTE — Progress Notes (Signed)
 Nutrition Follow Up Note   DOCUMENTATION CODES:   Obesity unspecified  INTERVENTION:   Glucerna Shake po TID, each supplement provides 220 kcal and 10 grams of protein  MVI po daily   Thiamine 100mg  po daily   Vitamin C 500mg  po BID   Zinc 220mg  po daily x 14 days   Liberalize diet   Pt remains at high refeed risk; recommend monitor potassium, magnesium and phosphorus labs daily until stable  Daily weights   NUTRITION DIAGNOSIS:   Inadequate oral intake related to chronic illness as evidenced by per patient/family report. -ongoing   GOAL:   Patient will meet greater than or equal to 90% of their needs -progressing   MONITOR:   PO intake, Supplement acceptance, Labs, Weight trends, I & O's, Skin  ASSESSMENT:   58 y/o female with h/o lichen planus, DM, esophageal dysphagia, HTN, CAD, gastroparesis, cirrhosis, esophageal varices, portal hypertension, OSA, chronic pain, endometrial cancer, CKD III, GERD, MDD and anxiety who is admitted with NSTEMI, UTI, AMS, sepsis, shock and bacteremia.  Met with pt in room today. Pt reports that she is feeling better. Pt reports fair appetite and oral intake but reports that everything tastes bland. Pt reports that she drinking the Glucerna supplements. Pt is documented to be eating anywhere from sips/bites to 100% of meals. RD will liberalize pt's diet. Pt noted to be refeeding on 3/3; will recheck electrolytes tomorrow. Per chart, pt is up ~11lbs from admission but remains down ~6lbs from her UBW. Plan is for SNF at discharge.   Medications reviewed and include: vitamin C, aspirin, celexa, lasix, insulin, lactulose, midodrine, remeron, MVI, protonix, prednisone, xifaxin, thiamine, zinc  Labs reviewed: Na 133(L), K 3.8 wnl, BUN 26(H) P 1.7(L)- 3/3 Hgb 8.5(L), Hct 27.3(L) Cbgs- 210, 105, 93, 118, 172 x 24 hrs   NUTRITION - FOCUSED PHYSICAL EXAM:  Flowsheet Row Most Recent Value  Orbital Region No depletion  Upper Arm Region No  depletion  Thoracic and Lumbar Region No depletion  Buccal Region No depletion  Temple Region No depletion  Clavicle Bone Region Mild depletion  Clavicle and Acromion Bone Region Mild depletion  Scapular Bone Region No depletion  Dorsal Hand No depletion  Patellar Region Mild depletion  Anterior Thigh Region No depletion  Posterior Calf Region No depletion  Edema (RD Assessment) Mild  Hair Reviewed  Eyes Reviewed  Mouth Reviewed  Skin Reviewed  Nails Reviewed   Diet Order:   Diet Order             Diet heart healthy/carb modified Fluid consistency: Thin  Diet effective now                  EDUCATION NEEDS:   Education needs have been addressed  Skin:  Skin Assessment: Reviewed RN Assessment (Stage 3 Pressure Injury; base of sacrum, Stage 2 Pressure Injury: left buttock, Irritant contact dermatitis over both buttocks)  Last BM:  3/5- type 3  Height:   Ht Readings from Last 1 Encounters:  01/26/24 5\' 4"  (1.626 m)    Weight:   Wt Readings from Last 1 Encounters:  02/03/24 93.3 kg    Ideal Body Weight:  54.5 kg  BMI:  Body mass index is 35.31 kg/m.  Estimated Nutritional Needs:   Kcal:  1900-2200kcal/day  Protein:  95-110g/day  Fluid:  1.7-1.9L/day  Betsey Holiday MS, RD, LDN If unable to be reached, please send secure chat to "RD inpatient" available from 8:00a-4:00p daily

## 2024-02-03 NOTE — TOC Progression Note (Signed)
 Transition of Care [facility]) - Progression Note    Patient Details  Name: Abigail Molina MRN: [id number] Date of Birth: 1966-09-26  [facility] CM/SW Contact  Chapman Fitch, RN Phone Number: 02/03/2024, 3:42 PM  Clinical Narrative:      Presented bed offers. Patient accepts bed at [facility].  Accepted bed in HUB and notified Dabe at [employer].  Requested that Dabe start auth today       Expected Discharge Plan and Services                                               Social Determinants of Health (SDOH) Interventions SDOH Screenings   Food Insecurity: Patient Unable To Answer (01/26/2024)  Housing: Patient Unable To Answer (01/26/2024)  Transportation Needs: Patient Unable To Answer (01/26/2024)  Utilities: Patient Unable To Answer (01/26/2024)  Depression (PHQ2-9): High Risk (11/02/2023)  Financial Resource Strain: Low Risk  (12/16/2023)   Received from [facility]  Physical Activity: Insufficiently Active (01/09/2021)   Received from [facility], [facility]  Social Connections: Moderately Isolated (09/02/2018)  Stress: Stress Concern Present (09/17/2022)   Received from [facility], [facility]  Tobacco Use: Low Risk  (01/26/2024)  Recent Concern: Tobacco Use - Medium Risk (01/04/2024)   Received from [facility] Literacy: Low Risk  (01/09/2021)   Received from [facility], [facility]    Readmission Risk Interventions     No data to display

## 2024-02-04 DIAGNOSIS — J9601 Acute respiratory failure with hypoxia: Secondary | ICD-10-CM | POA: Diagnosis not present

## 2024-02-04 DIAGNOSIS — A419 Sepsis, unspecified organism: Secondary | ICD-10-CM | POA: Diagnosis not present

## 2024-02-04 DIAGNOSIS — G9341 Metabolic encephalopathy: Secondary | ICD-10-CM | POA: Diagnosis not present

## 2024-02-04 DIAGNOSIS — R6521 Severe sepsis with septic shock: Secondary | ICD-10-CM | POA: Diagnosis not present

## 2024-02-04 LAB — GLUCOSE, CAPILLARY
Glucose-Capillary: 211 mg/dL — ABNORMAL HIGH (ref 70–99)
Glucose-Capillary: 115 mg/dL — ABNORMAL HIGH (ref 70–99)
Glucose-Capillary: 92 mg/dL (ref 70–99)
Glucose-Capillary: 194 mg/dL — ABNORMAL HIGH (ref 70–99)
Glucose-Capillary: 239 mg/dL — ABNORMAL HIGH (ref 70–99)
Glucose-Capillary: 300 mg/dL — ABNORMAL HIGH (ref 70–99)

## 2024-02-04 LAB — PHOSPHORUS: Phosphorus: 2.1 mg/dL — ABNORMAL LOW (ref 2.5–4.6)

## 2024-02-04 LAB — CBC
HCT: 27.8 % — ABNORMAL LOW (ref 36.0–46.0)
Hemoglobin: 8.8 g/dL — ABNORMAL LOW (ref 12.0–15.0)
MCH: 25.9 pg — ABNORMAL LOW (ref 26.0–34.0)
MCHC: 31.7 g/dL (ref 30.0–36.0)
MCV: 81.8 fL (ref 80.0–100.0)
Platelets: 160 10*3/uL (ref 150–400)
RBC: 3.4 MIL/uL — ABNORMAL LOW (ref 3.87–5.11)
RDW: 19.1 % — ABNORMAL HIGH (ref 11.5–15.5)
WBC: 6.8 10*3/uL (ref 4.0–10.5)
nRBC: 0 % (ref 0.0–0.2)

## 2024-02-04 LAB — MAGNESIUM: Magnesium: 1.8 mg/dL (ref 1.7–2.4)

## 2024-02-04 MED ORDER — POTASSIUM & SODIUM PHOSPHATES 280-160-250 MG PO PACK
2.0000 | PACK | Freq: Three times a day (TID) | ORAL | Status: AC
  Administered 2024-02-04 (×4): 2 via ORAL
  Filled 2024-02-04 (×4): qty 2

## 2024-02-04 NOTE — Progress Notes (Signed)
 Physical Therapy Treatment Patient Details Name: Abigail Molina MRN: [id number] DOB: 10-02-1966 Today's Date: 02/04/2024   History of Present Illness Patient is a 58 year old female found unresponsive, was intubated. Found to have septic shock, NSTEMI, acute metabolic encephalopathy. History of hypertension, hyperlipidemia, type 2 diabetes, anemia, endometrial cancer, OSA, GERD, NASH.    PT Comments  Pt reports to be feeling better since last session, continues to feel wobbly when up. Larger focus on AMB balance training today and continued with household distance AMB with RW. Gait speed continues to improve each day. Close minGuard assist provided for safety during balance training. Will continue to follow.     If plan is discharge home, recommend the following: Two people to help with walking and/or transfers;A lot of help with bathing/dressing/bathroom;Help with stairs or ramp for entrance;Assist for transportation   Can travel by private vehicle     No  Equipment Recommendations  None recommended by PT    Recommendations for Other Services       Precautions / Restrictions Precautions Precautions: Fall Restrictions Weight Bearing Restrictions Per Provider Order: No     Mobility  Bed Mobility Overal bed mobility: Modified Independent Bed Mobility: Supine to Sit, Sit to Supine     Supine to sit: Modified independent (Device/Increase time) Sit to supine: Modified independent (Device/Increase time)        Transfers Overall transfer level: Needs assistance Equipment used: Rolling walker (2 wheels) Transfers: Sit to/from Stand Sit to Stand: Supervision                Ambulation/Gait Ambulation/Gait assistance: Contact guard assist, Supervision Gait Distance (Feet): 100 Feet Assistive device: Rolling walker (2 wheels) Gait Pattern/deviations: Step-through pattern Gait velocity: 0.77m/s today; 0.43m/s on 3/4; 0.22m/s on 3/3.     General Gait Details: still  reporting to feel wobbly several times today   Stairs             Wheelchair Mobility     Tilt Bed    Modified Rankin (Stroke Patients Only)       Balance                                            Communication    Cognition                                        Cueing    Exercises Other Exercises Other Exercises: 150ft AMB in room without device minGUadA, lateral unsteadiness with single UE support to side Other Exercises: side stepping along counter top 2x each way with 2 hand support and 2x each way with 1 hand support    General Comments        Pertinent Vitals/Pain Pain Assessment Pain Assessment: No/denies pain    Home Living                          Prior Function            PT Goals (current goals can now be found in the care plan section) Acute Rehab PT Goals Patient Stated Goal: go to rehab PT Goal Formulation: With patient Time For Goal Achievement: 02/11/24 Potential to Achieve Goals: Good Progress towards PT goals: Progressing toward goals  Frequency    Min 1X/week      PT Plan      Co-evaluation              AM-PAC PT "6 Clicks" Mobility   Outcome Measure  Help needed turning from your back to your side while in a flat bed without using bedrails?: A Little Help needed moving from lying on your back to sitting on the side of a flat bed without using bedrails?: A Little Help needed moving to and from a bed to a chair (including a wheelchair)?: A Little Help needed standing up from a chair using your arms (e.g., wheelchair or bedside chair)?: A Little Help needed to walk in hospital room?: A Little Help needed climbing 3-5 steps with a railing? : A Little 6 Click Score: 18    End of Session Equipment Utilized During Treatment: Gait belt Activity Tolerance: Patient tolerated treatment well;No increased pain Patient left: with call bell/phone within reach;with bed alarm  set;in bed Nurse Communication: Mobility status PT Visit Diagnosis: Muscle weakness (generalized) (M62.81);Unsteadiness on feet (R26.81)     Time: 1610-9604 PT Time Calculation (min) (ACUTE ONLY): 13 min  Charges:    $Therapeutic Activity: 8-22 mins PT General Charges $$ ACUTE PT VISIT: 1 Visit                    11:28 AM, 02/04/24 Rosamaria Lints, PT, DPT Physical Therapist - [facility]  [phone] (ASCOM)    Verland Sprinkle C 02/04/2024, 11:27 AM

## 2024-02-04 NOTE — Plan of Care (Signed)

## 2024-02-04 NOTE — Progress Notes (Signed)
 [facility] CLINIC CARDIOLOGY PROGRESS NOTE       Patient ID: Abigail Molina MRN: [id number] DOB/AGE: 03-31-1966 58 y.o.  Admit date: 01/26/2024 Referring Physician Harlon Ditty, NP Primary Physician Care, [place] [facility]  Primary Cardiologist Abigail Molina Reason for Consultation NSTEMI  HPI: STANISHA LORENZ is a 58 y.o. female  with a past medical history of hypertension, hyperlipidemia, type 2 diabetes, anemia, endometrial cancer, OSA, GERD, NASH who presented to the ED on 01/26/2024 for unresponsiveness, found down by family and EMS brought her to the ED.  Troponins elevated.  Cardiology was consulted for further evaluation.   Interval history: -Patient seen and examined this morning, resting comfortably in bed. -Denies any chest pain or shortness of breath symptoms. On room air. -Hgb remains low. Denies any bleeding, dizziness/lightheadedness.  Review of systems complete and found to be negative unless listed above    Past Medical History:  Diagnosis Date   Anemia    Anxiety    Arthritis    Cancer (HCC)    Endometrial cancer   Chronic pain    Cirrhosis of liver (HCC)    Depression    Diabetes mellitus without complication (HCC)    Fatty liver disease, nonalcoholic 2021   GERD (gastroesophageal reflux disease)    Glaucoma    Hyperlipidemia    Hypertension    Meningioma (HCC)    NSTEMI (non-ST elevated myocardial infarction) (HCC) 02/03/2024   OSA (obstructive sleep apnea) 05/08/2023   Overactive bladder    Pharmacologic therapy 11/02/2023   Psoriasis 2015    Past Surgical History:  Procedure Laterality Date   ABDOMINAL HYSTERECTOMY  2007   CHOLECYSTECTOMY  2000    Medications Prior to Admission  Medication Sig Dispense Refill Last Dose/Taking   aspirin 81 MG tablet Take 81 mg by mouth daily.   01/25/2024   Baclofen 5 MG TABS Take 2 tablets by mouth at bedtime.   01/25/2024   buPROPion (WELLBUTRIN XL) 150 MG 24 hr tablet Take 1 tablet (150 mg total) by mouth daily. 30 tablet 3  01/25/2024   canagliflozin (INVOKANA) 300 MG TABS tablet Take by mouth.   01/25/2024   carvedilol (COREG) 6.25 MG tablet Take 6.25 mg by mouth 2 (two) times daily.   01/25/2024   citalopram (CELEXA) 20 MG tablet Take 20 mg by mouth daily.   01/25/2024   Continuous Glucose Sensor (FREESTYLE LIBRE 3 SENSOR) MISC by Does not apply route daily.   01/26/2024   COSENTYX SENSOREADY, 300 MG, 150 MG/ML SOAJ Inject into the skin.   Taking   Ferrous Sulfate (SLOW FE) 142 (45 Fe) MG TBCR Take by mouth.   01/26/2024   furosemide (LASIX) 40 MG tablet Take 40 mg by mouth 2 (two) times daily.   01/25/2024   gabapentin (NEURONTIN) 300 MG capsule Take by mouth once.   01/25/2024   insulin lispro (HUMALOG) 100 UNIT/ML KwikPen Inject into the skin.   01/25/2024   lactulose (CHRONULAC) 10 GM/15ML solution SMARTSIG:Milliliter(s) By Mouth   Past Week   LANTUS SOLOSTAR 100 UNIT/ML Solostar Pen Inject 66 Units into the skin daily.   01/25/2024   levETIRAcetam (KEPPRA) 500 MG tablet Take 750 mg by mouth 2 (two) times daily.   Taking   MAGNESIUM-OXIDE 400 (240 Mg) MG tablet Take 2 tablets by mouth 2 (two) times daily.   01/25/2024   metFORMIN (GLUCOPHAGE-XR) 500 MG 24 hr tablet Take 1,000 mg by mouth 2 (two) times daily.   01/25/2024   mirtazapine (REMERON) 15 MG  tablet Take 1 tablet (15 mg total) by mouth at bedtime. 30 tablet 0 01/25/2024   ondansetron (ZOFRAN-ODT) 4 MG disintegrating tablet Dissolve 1 tablet (4 mg total) in the mouth every twelve (12) hours as needed for nausea.   Past Week   pantoprazole (PROTONIX) 40 MG tablet TAKE ONE TABLET BY MOUTH EVERY DAY 90 tablet 0 01/25/2024   promethazine (PHENERGAN) 12.5 MG tablet Take by mouth.   Taking   scopolamine (TRANSDERM-SCOP) 1 MG/3DAYS Place 1 patch onto the skin every 3 (three) days.   Taking   spironolactone (ALDACTONE) 100 MG tablet Take 100 mg by mouth 2 (two) times daily.   01/25/2024   XIFAXAN 550 MG TABS tablet Take 550 mg by mouth 2 (two) times daily.   01/25/2024    zolpidem (AMBIEN) 5 MG tablet Take 1 tablet (5 mg total) by mouth at bedtime as needed for sleep. 30 tablet 1 Past Week   Accu-Chek Softclix Lancets lancets SMARTSIG:Topical      BD PEN NEEDLE NANO 2ND GEN 32G X 4 MM MISC USE 1 NIGHTLY      chlorhexidine (PERIDEX) 0.12 % solution SMARTSIG:0.5 Capful(s) By Mouth Morning-Evening (Patient not taking: Reported on 01/26/2024)   Not Taking   clotrimazole (MYCELEX) 10 MG troche Take 10 mg by mouth 5 (five) times daily. (Patient not taking: Reported on 01/26/2024)   Not Taking   Continuous Glucose Receiver (DEXCOM G7 RECEIVER) DEVI USE AS INSTRUCTED WITH G7 SENSORS (Patient not taking: Reported on 01/26/2024)   Not Taking   Continuous Glucose Sensor (DEXCOM G7 SENSOR) MISC USE TO MONITOR BLOOD GLUCOSE LEVELS CONTINUOUSLY. CHANGE SENSOR EVERY 10 DAYS. (Patient not taking: Reported on 01/26/2024)   Not Taking   Dulaglutide (TRULICITY) 1.5 MG/0.5ML SOPN Inject 1.5 mg into the skin once a week. Thursdays (Patient not taking: Reported on 01/26/2024)   Not Taking   insulin glargine-yfgn (SEMGLEE, YFGN,) 100 UNIT/ML Pen INJECT 50 UNITS UNDER THE SKIN NIGHTLY AS DIRECTED BY YOUR DOCTOR AROUND SAME TIME EVERY DAY. TOTAL DAILY DOSE NOT TO EXCEED 50 UNITS (Patient not taking: Reported on 01/26/2024)   Not Taking   Insulin Pen Needle 32G X 6 MM MISC 1 Syringe by Does not apply route. Use with Victoza.      naltrexone (DEPADE) 50 MG tablet Take 50 mg by mouth daily.      Secukinumab (COSENTYX SENSOREADY PEN) 150 MG/ML SOAJ Inject the contents of 2 pens (300 mg total) under the skin once a week at weeks 0, 1, 2, 3, and 4. THEN inject the contents of 2 pens (300 mg total) every 4 weeks thereafter. (Patient not taking: Reported on 01/26/2024)   Not Taking   Sharps Container (BD SHARPS COLLECTOR) MISC Use as directed to dispose of Cosentyx pens.      Social History   Socioeconomic History   Marital status: Widowed    Spouse name: Not on file   Number of children: Not on file    Years of education: Not on file   Highest education level: Associate degree: academic program  Occupational History   Not on file  Tobacco Use   Smoking status: Never   Smokeless tobacco: Never  Vaping Use   Vaping status: Never Used  Substance and Sexual Activity   Alcohol use: Not Currently    Comment:  Rarely. One or two drinks a year    Drug use: No   Sexual activity: Not Currently    Birth control/protection: Abstinence  Other Topics Concern  Not on file  Social History Narrative   PT is not working right now. Pt worries about financial needs since her husband died in Jun 04, 2024 and she has no income. She applied for Widow's benefits in July but has not heard any information and the application is still pending. If possible please inquire about application status.     Social Drivers of Corporate investment banker Strain: Low Risk  (12/16/2023)   Received from [facility]   Overall Financial Resource Strain (CARDIA)    Difficulty of Paying Living Expenses: Not very hard  Food Insecurity: Patient Unable To Answer (01/26/2024)   Hunger Vital Sign    Worried About Running Out of Food in the Last Year: Patient unable to answer    Ran Out of Food in the Last Year: Patient unable to answer  Transportation Needs: Patient Unable To Answer (01/26/2024)   PRAPARE - Transportation    Lack of Transportation (Medical): Patient unable to answer    Lack of Transportation (Non-Medical): Patient unable to answer  Physical Activity: Insufficiently Active (01/09/2021)   [facility] from [facility]   Exercise Vital Sign    Days of Exercise per Week: 1 day    Minutes of Exercise per Session: 40 min  Stress: Stress Concern Present (09/17/2022)   Received from [facility], [facility] - Occupational Stress Questionnaire    Feeling of Stress : Very much  Social Connections: Moderately Isolated (09/02/2018)   Social Connection and  Isolation Panel [NHANES]    Frequency of Communication with Friends and Family: Once a week    Frequency of Social Gatherings with Friends and Family: Twice a week    Attends Religious Services: Never    Database administrator or Organizations: No    Attends Banker Meetings: Never    Marital Status: Widowed  Intimate Partner Violence: Patient Unable To Answer (01/26/2024)   Humiliation, Afraid, Rape, and Kick questionnaire    Fear of Current or Ex-Partner: Patient unable to answer    Emotionally Abused: Patient unable to answer    Physically Abused: Patient unable to answer    Sexually Abused: Patient unable to answer    Family History  Problem Relation Age of Onset   Diabetes Mother    Hypertension Mother    Depression Mother    Obesity Mother    Vision loss Mother    Alcohol abuse Cousin    Bipolar disorder Cousin    Heart disease Maternal Grandfather    Alcohol abuse Maternal Grandmother    Stroke Paternal Grandfather      Vitals:   02/03/24 1554 02/03/24 1910 02/04/24 0417 02/04/24 0747  BP: 131/68 112/65 (!) 105/51 (!) 100/54  Pulse: 98 (!) 104 91 89  Resp: 18 19 20 18   Temp: (!) 97.4 F (36.3 C) 98.6 F (37 C) 98 F (36.7 C) 97.9 F (36.6 C)  TempSrc: Oral Oral  Oral  SpO2: 99% 98% 99% 98%  Weight:      Height:        PHYSICAL EXAM General: Well appearing female, well nourished, in no acute distress. HEENT: Normocephalic and atraumatic. Neck: No JVD.  Lungs: Normal respiratory effort on room air. Clear bilaterally to auscultation. No wheezes, crackles, rhonchi.  Heart: HRRR. Normal S1 and S2 without gallops or murmurs.  Abdomen: Non-distended appearing.  Msk: Normal strength and tone for age. Extremities: Warm and well perfused.  No clubbing, cyanosis. 1+ edema.  Neuro: Alert and oriented X 3. Psych: Answers questions appropriately.   Labs: Basic Metabolic Panel: Recent Labs    02/02/24 0550 02/03/24 0510 02/04/24 0520  NA 134* 133*  --    K 3.8 3.8  --   CL 104 102  --   CO2 24 22  --   GLUCOSE 145* 106*  --   BUN 30* 26*  --   CREATININE 0.84 0.95  --   CALCIUM 9.2 9.3  --   MG  --   --  1.8  PHOS  --   --  2.1*   Liver Function Tests: No results for input(s): "AST", "ALT", "ALKPHOS", "BILITOT", "PROT", "ALBUMIN" in the last 72 hours.  No results for input(s): "LIPASE", "AMYLASE" in the last 72 hours. CBC: Recent Labs    02/03/24 0510 02/04/24 0520  WBC 9.2 6.8  HGB 8.5* 8.8*  HCT 27.3* 27.8*  MCV 83.0 81.8  PLT 147* 160   Cardiac Enzymes: No results for input(s): "CKTOTAL", "CKMB", "CKMBINDEX", "TROPONINIHS" in the last 72 hours.  BNP: No results for input(s): "BNP" in the last 72 hours.  D-Dimer: No results for input(s): "DDIMER" in the last 72 hours. Hemoglobin A1C: No results for input(s): "HGBA1C" in the last 72 hours.  Fasting Lipid Panel: No results for input(s): "CHOL", "HDL", "LDLCALC", "TRIG", "CHOLHDL", "LDLDIRECT" in the last 72 hours. Thyroid Function Tests: No results for input(s): "TSH", "T4TOTAL", "T3FREE", "THYROIDAB" in the last 72 hours.  Invalid input(s): "FREET3" Anemia Panel: Recent Labs    02/03/24 0510  FERRITIN 38  TIBC 294  IRON 31     Radiology: EEG adult Result Date: 01/26/2024 Charlsie Quest, MD     01/26/2024  4:46 PM Patient Name: MAGDALA BRAHMBHATT MRN: [id number] Epilepsy Attending: Charlsie Quest Referring Physician/Provider: Ezequiel Essex, NP Date: 01/26/2024 Duration: 32.31 mins Patient history: 58yo female with ams. EEG to evaluate for seizure Level of alertness: comatose AEDs during EEG study: LEV Technical aspects: This EEG study was done with scalp electrodes positioned according to the 10-20 International system of electrode placement. Electrical activity was reviewed with band pass filter of 1-70Hz , sensitivity of 7 uV/mm, display speed of 63mm/sec with a 60Hz  notched filter applied as appropriate. EEG data were recorded continuously and digitally stored.   Video monitoring was available and reviewed as appropriate. Description: EEG showed continuous generalized 3 to 5 Hz theta-delta slowing. Hyperventilation and photic stimulation were not performed.   ABNORMALITY - Continuous slow, generalized IMPRESSION: This study is suggestive of severe diffuse encephalopathy. No seizures or epileptiform discharges were seen throughout the recording. Charlsie Quest   ECHOCARDIOGRAM COMPLETE Result Date: 01/26/2024    ECHOCARDIOGRAM REPORT   Patient Name:   SARANN TREGRE Date of Exam: 01/26/2024 Medical Rec #:  [id number]    Height:       64.0 in Accession #:    [id number]   Weight:       212.0 lb Date of Birth:  1966/02/11    BSA:          2.005 m Patient Age:    57 years     BP:           85/65 mmHg Patient Gender: F            HR:           Not listed in chart bpm. Exam Location:  [facility] Procedure: 2D Echo, Cardiac Doppler and Color  Doppler (Both Spectral and Color            Flow Doppler were utilized during procedure). Indications:     Abnormal ECG R94.31  History:         Patient has no prior history of Echocardiogram examinations.                  Risk Factors:Hypertension and Dyslipidemia. Anxiety.  Sonographer:     Cristela Blue Referring Phys:  [id number] Ezequiel Essex Diagnosing Phys: Yvonne Kendall MD  Sonographer Comments: Echo performed with patient supine and on artificial respirator and no parasternal window. Zoll pad over sternum. IMPRESSIONS  1. Left ventricular ejection fraction, by estimation, is >55%. The left ventricle has normal function. Left ventricular endocardial border not optimally defined to evaluate regional wall motion. Left ventricular diastolic parameters were normal.  2. Right ventricular systolic function is normal. The right ventricular size is normal. Tricuspid regurgitation signal is inadequate for assessing PA pressure.  3. The mitral valve is normal in structure. No evidence of mitral valve regurgitation. No evidence of mitral stenosis.  4. The  aortic valve was not well visualized. Aortic valve regurgitation is not visualized. No aortic stenosis is present. FINDINGS  Left Ventricle: Left ventricular ejection fraction, by estimation, is >55%. The left ventricle has normal function. Left ventricular endocardial border not optimally defined to evaluate regional wall motion. Strain imaging was not performed. The left ventricular internal cavity size was normal in size. There is borderline left ventricular hypertrophy. Left ventricular diastolic parameters were normal. Right Ventricle: The right ventricular size is normal. No increase in right ventricular wall thickness. Right ventricular systolic function is normal. Tricuspid regurgitation signal is inadequate for assessing PA pressure. Left Atrium: Left atrial size was normal in size. Right Atrium: Right atrial size was normal in size. Pericardium: There is no evidence of pericardial effusion. Mitral Valve: The mitral valve is normal in structure. No evidence of mitral valve regurgitation. No evidence of mitral valve stenosis. MV peak gradient, 13.0 mmHg. The mean mitral valve gradient is 4.0 mmHg. Tricuspid Valve: The tricuspid valve is normal in structure. Tricuspid valve regurgitation is trivial. Aortic Valve: The aortic valve was not well visualized. Aortic valve regurgitation is not visualized. No aortic stenosis is present. Aortic valve mean gradient measures 3.0 mmHg. Aortic valve peak gradient measures 5.3 mmHg. Aortic valve area, by VTI measures 3.79 cm. Pulmonic Valve: The pulmonic valve was not well visualized. Pulmonic valve regurgitation is not visualized. No evidence of pulmonic stenosis. Aorta: The aortic root is normal in size and structure. Pulmonary Artery: The pulmonary artery is not well seen. Venous: IVC assessment for right atrial pressure unable to be performed due to mechanical ventilation. IAS/Shunts: No atrial level shunt detected by color flow Doppler. Additional Comments: 3D  imaging was not performed.  LEFT VENTRICLE PLAX 2D LVIDd:         3.50 cm   Diastology LVIDs:         2.60 cm   LV e' medial:    12.90 cm/s LV PW:         1.10 cm   LV E/e' medial:  6.0 LV IVS:        1.00 cm   LV e' lateral:   16.90 cm/s LVOT diam:     2.00 cm   LV E/e' lateral: 4.6 LV SV:         63 LV SV Index:   31 LVOT Area:  3.14 cm  RIGHT VENTRICLE RV Basal diam:  3.40 cm RV Mid diam:    3.50 cm RV S prime:     15.30 cm/s TAPSE (M-mode): 2.3 cm LEFT ATRIUM           Index        RIGHT ATRIUM           Index LA diam:      3.30 cm 1.65 cm/m   RA Area:     15.10 cm LA Vol (A2C): 19.1 ml 9.52 ml/m   RA Volume:   33.10 ml  16.51 ml/m LA Vol (A4C): 26.1 ml 13.01 ml/m  AORTIC VALVE AV Area (Vmax):    2.92 cm AV Area (Vmean):   3.13 cm AV Area (VTI):     3.79 cm AV Vmax:           115.00 cm/s AV Vmean:          85.900 cm/s AV VTI:            0.165 m AV Peak Grad:      5.3 mmHg AV Mean Grad:      3.0 mmHg LVOT Vmax:         107.00 cm/s LVOT Vmean:        85.600 cm/s LVOT VTI:          0.199 m LVOT/AV VTI ratio: 1.21  AORTA Ao Root diam: 3.10 cm MITRAL VALVE MV Area (PHT): 5.75 cm    SHUNTS MV Area VTI:   2.10 cm    Systemic VTI:  0.20 m MV Peak grad:  13.0 mmHg   Systemic Diam: 2.00 cm MV Mean grad:  4.0 mmHg MV Vmax:       1.80 m/s MV Vmean:      88.2 cm/s MV Decel Time: 132 msec MV E velocity: 77.60 cm/s MV A velocity: 93.80 cm/s MV E/A ratio:  0.83 Cristal Deer End MD Electronically signed by Yvonne Kendall MD Signature Date/Time: 01/26/2024/4:06:41 PM    Final    DG Chest Port 1 View Result Date: 01/26/2024 CLINICAL DATA:  Central line readjustment. EXAM: PORTABLE CHEST 1 VIEW COMPARISON:  Chest x-ray from same day at 1247 hours. FINDINGS: The patient remains rotated to the left. Interval retraction of the left internal jugular central venous catheter with the tip now in the distal SVC. Unchanged endotracheal and enteric tubes. Unchanged cardiomegaly and left retrocardiac opacity. No pleural  effusion or pneumothorax. No acute osseous abnormality. IMPRESSION: 1. Interval retraction of the left internal jugular central venous catheter with the tip now in the distal SVC. Electronically Signed   By: Obie Dredge M.D.   On: 01/26/2024 14:11   DG Chest Port 1 View Result Date: 01/26/2024 CLINICAL DATA:  Central line placement. EXAM: PORTABLE CHEST 1 VIEW COMPARISON:  Chest x-ray from same day at 0950 hours. FINDINGS: The patient is rotated to the left. New left internal jugular central venous catheter with tip in the proximal right atrium. Unchanged endotracheal and enteric tubes. Unchanged cardiomegaly. Unchanged retrocardiac left lower lobe density. Clear right lung. No large pleural effusion or pneumothorax. No acute osseous abnormality. IMPRESSION: 1. New left internal jugular central venous catheter with tip in the proximal right atrium. No pneumothorax. 2. Unchanged retrocardiac left lower lobe atelectasis or pneumonia. Electronically Signed   By: Obie Dredge M.D.   On: 01/26/2024 14:10   DG Abdomen 1 View Result Date: 01/26/2024 CLINICAL DATA:  post og tube placement EXAM: ABDOMEN - 1  VIEW COMPARISON:  Chest XR and CT AP, concurrent. FINDINGS: Support lines: Enteric decompression tube, with tube tip and side port within stomach. The imaged bowel gas pattern is normal. Cholecystectomy clips. No radio-opaque calculi or other significant radiographic abnormality. IMPRESSION: 1. NG tube, well-positioned with tip and side port within the stomach. 2. Nonobstructed bowel-gas pattern. Electronically Signed   By: Roanna Banning M.D.   On: 01/26/2024 11:44   DG Chest Portable 1 View Result Date: 01/26/2024 CLINICAL DATA:  post intubation EXAM: PORTABLE CHEST 1 VIEW COMPARISON:  Chest XR, 01/26/2024.  CT AP, concurrent. FINDINGS: Support lines: Intubation with ETT within the distal thoracic trachea, 3 cm from carina. Enteric decompression tube with tip extending outside field. Cardiomegaly. Aortic  arch atherosclerosis. Mediastinum is within normal limits given technique and patient rotation. The RIGHT lung is clear. Patchy retrocardiac opacity. No large pleural effusion or pneumothorax. No acute displaced fracture. IMPRESSION: 1. Intubation with ETT well-positioned at the distal thoracic trachea. Additional lines and tubes, as above. 2. Cardiomegaly and Aortic Atherosclerosis (ICD10-I70.0). 3. Retrocardiac opacity is likely to represent atelectasis, however early pneumonia can appear similar. Electronically Signed   By: Roanna Banning M.D.   On: 01/26/2024 11:41   CT ABDOMEN PELVIS WO CONTRAST Result Date: 01/26/2024 CLINICAL DATA:  Sepsis EXAM: CT ABDOMEN AND PELVIS WITHOUT CONTRAST TECHNIQUE: Multidetector CT imaging of the abdomen and pelvis was performed following the standard protocol without IV contrast. RADIATION DOSE REDUCTION: This exam was performed according to the departmental dose-optimization program which includes automated exposure control, adjustment of the mA and/or kV according to patient size and/or use of iterative reconstruction technique. COMPARISON:  CT AP, 11/12/2012 and 08/10/2008. Chest XR and KUB, concurrent. FINDINGS: Lower chest: Trace LEFT pleural effusion with adjacent dependent patchy pulmonary consolidation. Hepatobiliary: Gross nodular contour liver. No focal abnormality. Cholecystectomy. No biliary dilatation. Pancreas: No pancreatic ductal dilatation or surrounding inflammatory changes. Spleen: Splenomegaly, measuring up to 17.0 cm Adrenals/Urinary Tract: Adrenal glands are unremarkable. Kidneys are normal, without renal calculi, focal lesion, or hydronephrosis. Bladder is decompressed by Foley catheter. Stomach/Bowel: Stomach is within normal limits. Enteric decompression tube, with tip within the gastric body. Appendix appears normal. No evidence of bowel wall thickening, distention, or inflammatory changes. Vascular/Lymphatic: Aortic atherosclerosis without aneurysmal  dilatation. No enlarged abdominal or pelvic lymph nodes. Reproductive: Hysterectomy.  No adnexal mass. Other: No abdominal wall hernia or abnormality. Trace mesenteric stranding. Small volume perihepatic, perisplenic, LEFT lower quadrant and deep pelvic ascites. Musculoskeletal: Mild body wall edema. No acute or significant osseous findings. IMPRESSION: Suboptimal evaluation, secondary to a lack of intravenous contrast. 1. No acute abdominopelvic process. 2. Cirrhosis with portal hypertension, as evidenced by splenomegaly. Small volume of abdominopelvic ascites. 3. Trace LEFT pleural effusion, with adjacent pulmonary consolidation. This may represent atelectasis, though early pneumonia can appear similar. Electronically Signed   By: Roanna Banning M.D.   On: 01/26/2024 11:20   CT HEAD WO CONTRAST ( ) Result Date: 01/26/2024 CLINICAL DATA:  Mental status change, unknown cause; Neck trauma, focal neuro deficit or paresthesia (Age 79-64y) EXAM: CT HEAD WITHOUT CONTRAST CT CERVICAL SPINE WITHOUT CONTRAST TECHNIQUE: Multidetector CT imaging of the head and cervical spine was performed following the standard protocol without intravenous contrast. Multiplanar CT image reconstructions of the cervical spine were also generated. RADIATION DOSE REDUCTION: This exam was performed according to the departmental dose-optimization program which includes automated exposure control, adjustment of the mA and/or kV according to patient size and/or use of iterative reconstruction technique. COMPARISON:  Abigail Molina Available. FINDINGS: CT HEAD FINDINGS Brain: No hemorrhage. No hydrocephalus. No extra-axial fluid collection. No mass effect. No mass lesion. 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 Vascular: No hyperdense vessel or unexpected calcification. Skull: Soft tissue swelling along the left frontal scalp. No evidence of an underlying calvarial fracture. Sinuses/Orbits: No middle ear or mastoid effusion. Paranasal sinuses  are clear. Orbits are unremarkable Other: Abigail Molina. CT CERVICAL SPINE FINDINGS Alignment: Normal. Skull base and vertebrae: No acute fracture. No primary bone lesion or focal pathologic process. Soft tissues and spinal canal: No prevertebral fluid or swelling. No visible canal hematoma. Disc levels:  CT evidence of high-grade spinal canal stenosis Upper chest: Negative. Other: Partially imaged endotracheal and enteric tubes in place. IMPRESSION: 1. No CT evidence of intracranial injury. 2. 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 If there is clinical concern for an acute infarct, consider further evaluation with MRI. 3. Soft tissue swelling along the left frontal scalp. No evidence of an underlying calvarial fracture. 4. No acute fracture or traumatic subluxation of the cervical spine. Electronically Signed   By: Lorenza Cambridge M.D.   On: 01/26/2024 10:46   CT Cervical Spine Wo Contrast Result Date: 01/26/2024 CLINICAL DATA:  Mental status change, unknown cause; Neck trauma, focal neuro deficit or paresthesia (Age 40-64y) EXAM: CT HEAD WITHOUT CONTRAST CT CERVICAL SPINE WITHOUT CONTRAST TECHNIQUE: Multidetector CT imaging of the head and cervical spine was performed following the standard protocol without intravenous contrast. Multiplanar CT image reconstructions of the cervical spine were also generated. RADIATION DOSE REDUCTION: This exam was performed according to the departmental dose-optimization program which includes automated exposure control, adjustment of the mA and/or kV according to patient size and/or use of iterative reconstruction technique. COMPARISON:  Abigail Molina Available. FINDINGS: CT HEAD FINDINGS Brain: No hemorrhage. No hydrocephalus. No extra-axial fluid collection. No mass effect. No mass lesion. 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 Vascular: No hyperdense vessel or unexpected calcification. Skull: Soft tissue swelling along the left frontal scalp. No evidence of an underlying  calvarial fracture. Sinuses/Orbits: No middle ear or mastoid effusion. Paranasal sinuses are clear. Orbits are unremarkable Other: Abigail Molina. CT CERVICAL SPINE FINDINGS Alignment: Normal. Skull base and vertebrae: No acute fracture. No primary bone lesion or focal pathologic process. Soft tissues and spinal canal: No prevertebral fluid or swelling. No visible canal hematoma. Disc levels:  CT evidence of high-grade spinal canal stenosis Upper chest: Negative. Other: Partially imaged endotracheal and enteric tubes in place. IMPRESSION: 1. No CT evidence of intracranial injury. 2. 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 If there is clinical concern for an acute infarct, consider further evaluation with MRI. 3. Soft tissue swelling along the left frontal scalp. No evidence of an underlying calvarial fracture. 4. No acute fracture or traumatic subluxation of the cervical spine. Electronically Signed   By: Lorenza Cambridge M.D.   On: 01/26/2024 10:46   DG Chest Port 1 View Result Date: 01/26/2024 CLINICAL DATA:  Sepsis. EXAM: PORTABLE CHEST 1 VIEW COMPARISON:  Chest radiograph dated 07/20/2021. FINDINGS: Shallow inspiration with minimal bibasilar atelectasis. No focal consolidation, pleural effusion, or pneumothorax. Mild cardiomegaly with mild central vascular congestion. No acute osseous pathology. IMPRESSION: Mild cardiomegaly with mild central vascular congestion. No focal consolidation. Electronically Signed   By: Elgie Collard M.D.   On: 01/26/2024 10:14    ECHO as above  TELEMETRY reviewed by me 02/04/2024: sinus rhythm rate 70s  EKG reviewed by me: Sinus rhythm rate 82 bpm  Data reviewed by me 02/04/2024:  last 24h vitals tele labs imaging I/O PCCM notes, hospitalist progress note  Principal Problem:   Septic shock (HCC) Active Problems:   Acute metabolic encephalopathy   Pressure injury of skin   NSTEMI (non-ST elevated myocardial infarction) (HCC)   Septicemia (HCC)   Acute respiratory failure with  hypoxemia (HCC)   Hyponatremia    ASSESSMENT AND PLAN:  Abigail Molina is a 58 y.o. female  with a past medical history of hypertension, hyperlipidemia, type 2 diabetes, anemia, endometrial cancer, OSA, GERD, NASH who presented to the ED on 01/26/2024 for unresponsiveness, found down by family and EMS brought her to the ED.  Troponins elevated.  Cardiology was consulted for further evaluation.   # NSTEMI, type I vs type II # Septic shock # Acute metabolic encephalopathy # Anemia Patient brought to the ED from home after being found unresponsive and hypotensive. Intubated in the ED. Placed on pressors and treated for septic shock, found to be bacteremic. Extubated today 02/04/24. Troponins trended 179 > 591 > 886 > 1349 > 2680 > 4087 > 3717. Echo this admission with preserved EF. Patient with intermittent CP for 6 months, has denied any chest pain throughout her hospitalization. -S/p blood transfusion 2/27, hemoglobin down to 7.5 on a.m. labs 02/02/24.  Plan was for heart catheterization but given anemia will postpone at this time.  Hemoglobin will need to stabilize before we will plan to proceed with catheterization thus will likely plan to pursue this outpatient.  Proceeding at this time would not be ideal as should she need intervention she would then require to be heparinized in the lab and be placed on DAPT. Can consider outpatient cath once HGB stabilized. -Given bacteremia, heart cath initially deferred. See further recommendations above. -Continue aspirin 81 mg daily.  Continue Crestor 20 mg daily.  Cardiology will sign off. Please haiku with questions or re-engage if needed.    This patient's plan of care was discussed and created with Dr. Juliann Pares and he is in agreement.  Signed: Gale Journey, PA-C  02/04/2024, 8:03 AM [facility]

## 2024-02-04 NOTE — Plan of Care (Signed)

## 2024-02-04 NOTE — TOC Progression Note (Signed)
 Transition of Care [facility]) - Progression Note    Patient Details  Name: Abigail Molina MRN: [id number] Date of Birth: [birth year]  [facility] CM/SW Contact  Chapman Fitch, RN Phone Number: 02/04/2024, 12:13 PM  Clinical Narrative:     Per Dabe at [facility] Berkley Harvey is still pending        Expected Discharge Plan and Services                                               Social Determinants of Health (SDOH) Interventions SDOH Screenings   Food Insecurity: Patient Unable To Answer (01/26/2024)  Housing: Patient Unable To Answer (01/26/2024)  Transportation Needs: Patient Unable To Answer (01/26/2024)  Utilities: Patient Unable To Answer (01/26/2024)  Depression (PHQ2-9): High Risk (11/02/2023)  Financial Resource Strain: Low Risk  (12/16/2023)   Received from [facility]  Physical Activity: Insufficiently Active (01/09/2021)   Received from [facility], [facility]  Social Connections: Moderately Isolated (09/02/2018)  Stress: Stress Concern Present (09/17/2022)   Received from [facility], [facility]  Tobacco Use: Low Risk  (01/26/2024)  Recent Concern: Tobacco Use - Medium Risk (01/04/2024)   Received from [facility] Literacy: Low Risk  (01/09/2021)   Received from [facility], [facility]    Readmission Risk Interventions     No data to display

## 2024-02-04 NOTE — Progress Notes (Signed)
 Progress Note   Patient: Abigail Molina ZOX:[id number] DOB: 05/20/1966 DOA: 01/26/2024     9 DOS: the patient was seen and examined on 02/04/2024   Brief hospital course: 58 y.o. female admitted with Acute Metabolic Encephalopathy and Severe Sepsis with Septic Shock in the setting of Klebsiella Oxytoca Bacteremia due to UTI and suspected pneumonia, along with NSTEMI, Acute Kidney Injury and Hyponatremia. Required intubation and mechanical ventilation, extubated 2/26.      Principal Problem:   Septic shock (HCC) Active Problems:   Acute metabolic encephalopathy   Pressure injury of skin   NSTEMI (non-ST elevated myocardial infarction) (HCC)   Septicemia (HCC)   Acute respiratory failure with hypoxemia (HCC)   Hyponatremia   Assessment and Plan: Acute respiratory failure with hypoxemia requiring mechanical ventilation. Acute metabolic encephalopathy, not toxic encephalopathy. Obstructive sleep apnea. Patient developed acute respiratory failure, most likely multifactorial due to altered mental status, obstruct sleep apnea, septic shock. I do not see a pneumonia on chest x-ray. Condition had improved, currently off oxygen.   #NSTEMI #Intermittent wide complex tachycardia  #Intermittent vtach #Intermittent bradycardia  Hx: HTN and HLD  Echocardiogram showed EF greater than 55% Continue aspirin 81 mg daily, start Crestor 20 mg daily -Hold outpatient antihypertensives and diuretic therapy  Cardiologist planning cardiac catheterization as an outpatient procedure   #Severe Sepsis with septic shock-present on admission due to bacteremia with Klebsiella #Klebsiella Oxytoca BACTEREMIA Klebsiella UTI Patient initially required vasopressor in the ICU, condition has improved. Antibiotics have been adjusted to p.o. Bactrim by infectious disease to complete a total of 2 weeks course for complicated UTI with last dose on 02/08/2024     #Severe hyponatremia ~ IMPROVed #Acute kidney injury  secondary to ATN  ~resolved. #Non anion gap metabolic acidosis ~resolved. #Hyperkalemia ~ RESOLVED #Lactic acidosis resolved. Condition mostly improved.  Sodium 133.    #NASH cirrhosis  #Portal hypertension  Hx: Esophageal varices  Continue outpatient lactulose and rifaximin    #Anemia of chronic disease. Thrombocytopenia secondary to sepsis. She does not have iron deficiency, prior B12 level was elevated.  Appear to be secondary to anemia of chronic disease.  This has been stable.   #Type II diabetes mellitus  Monitor glucose level closely Continue insulin therapy   #Stage II pressure ulcer present on admission  Continue wound care management   Patient condition still stable for discharge, currently pending nursing home placement.  No change in treatment plan.       Subjective:  Patient doing well today, she has no complaint.  Physical Exam: Vitals:   02/03/24 1554 02/03/24 1910 02/04/24 0417 02/04/24 0747  BP: 131/68 112/65 (!) 105/51 (!) 100/54  Pulse: 98 (!) 104 91 89  Resp: 18 19 20 18   Temp: (!) 97.4 F (36.3 C) 98.6 F (37 C) 98 F (36.7 C) 97.9 F (36.6 C)  TempSrc: Oral Oral  Oral  SpO2: 99% 98% 99% 98%  Weight:      Height:       General exam: Appears calm and comfortable  Respiratory system: Clear to auscultation. Respiratory effort normal. Cardiovascular system: S1 & S2 heard, RRR. No JVD, murmurs, rubs, gallops or clicks. No pedal edema. Gastrointestinal system: Abdomen is nondistended, soft and nontender. No organomegaly or masses felt. Normal bowel sounds heard. Central nervous system: Alert and oriented. No focal neurological deficits. Extremities: Symmetric 5 x 5 power. Skin: No rashes, lesions or ulcers Psychiatry: Judgement and insight appear normal. Mood & affect appropriate.    Data Reviewed:  Lab results reviewed.  Family Communication: None  Disposition: Status is: Inpatient Remains inpatient appropriate because: Unsafe  discharge, pending nursing home placement.     Time spent: 25 minutes  Author: Marrion Coy, MD 02/04/2024 2:06 PM  For on call review [email or web address].

## 2024-02-05 DIAGNOSIS — A419 Sepsis, unspecified organism: Secondary | ICD-10-CM | POA: Diagnosis not present

## 2024-02-05 DIAGNOSIS — J9601 Acute respiratory failure with hypoxia: Secondary | ICD-10-CM | POA: Diagnosis not present

## 2024-02-05 DIAGNOSIS — R6521 Severe sepsis with septic shock: Secondary | ICD-10-CM | POA: Diagnosis not present

## 2024-02-05 DIAGNOSIS — G9341 Metabolic encephalopathy: Secondary | ICD-10-CM | POA: Diagnosis not present

## 2024-02-05 LAB — GLUCOSE, CAPILLARY
Glucose-Capillary: 113 mg/dL — ABNORMAL HIGH (ref 70–99)
Glucose-Capillary: 133 mg/dL — ABNORMAL HIGH (ref 70–99)
Glucose-Capillary: 211 mg/dL — ABNORMAL HIGH (ref 70–99)
Glucose-Capillary: 272 mg/dL — ABNORMAL HIGH (ref 70–99)
Glucose-Capillary: 333 mg/dL — ABNORMAL HIGH (ref 70–99)
Glucose-Capillary: 77 mg/dL (ref 70–99)

## 2024-02-05 LAB — PHOSPHORUS: Phosphorus: 2.4 mg/dL — ABNORMAL LOW (ref 2.5–4.6)

## 2024-02-05 LAB — BASIC METABOLIC PANEL
Anion gap: 11 (ref 5–15)
BUN: 26 mg/dL — ABNORMAL HIGH (ref 6–20)
CO2: 23 mmol/L (ref 22–32)
Calcium: 9.3 mg/dL (ref 8.9–10.3)
Chloride: 98 mmol/L (ref 98–111)
Creatinine, Ser: 1.27 mg/dL — ABNORMAL HIGH (ref 0.44–1.00)
GFR, Estimated: 49 mL/min — ABNORMAL LOW (ref >60)
Glucose, Bld: 109 mg/dL — ABNORMAL HIGH (ref 70–99)
Potassium: 3.3 mmol/L — ABNORMAL LOW (ref 3.5–5.1)
Sodium: 132 mmol/L — ABNORMAL LOW (ref 135–145)

## 2024-02-05 LAB — MAGNESIUM: Magnesium: 1.7 mg/dL (ref 1.7–2.4)

## 2024-02-05 MED ORDER — POTASSIUM & SODIUM PHOSPHATES 280-160-250 MG PO PACK
2.0000 | PACK | Freq: Three times a day (TID) | ORAL | Status: AC
  Administered 2024-02-05 – 2024-02-06 (×4): 2 via ORAL
  Filled 2024-02-05 (×4): qty 2

## 2024-02-05 MED ORDER — MAGNESIUM SULFATE 2 GM/50ML IV SOLN
2.0000 g | Freq: Once | INTRAVENOUS | Status: AC
  Administered 2024-02-05: 2 g via INTRAVENOUS
  Filled 2024-02-05: qty 50

## 2024-02-05 MED ORDER — POTASSIUM CHLORIDE CRYS ER 20 MEQ PO TBCR
40.0000 meq | EXTENDED_RELEASE_TABLET | ORAL | Status: AC
  Administered 2024-02-05 (×2): 40 meq via ORAL
  Filled 2024-02-05 (×2): qty 2

## 2024-02-05 NOTE — Plan of Care (Signed)

## 2024-02-05 NOTE — Progress Notes (Signed)
 Occupational Therapy Treatment Patient Details Name: Abigail Molina MRN: [id number] DOB: January 30, 1966 Today's Date: 02/05/2024   History of present illness Patient is a 58 year old female found unresponsive, was intubated. Found to have septic shock, NSTEMI, acute metabolic encephalopathy. History of hypertension, hyperlipidemia, type 2 diabetes, anemia, endometrial cancer, OSA, GERD, NASH.   OT comments  Upon entering the room, pt seated on commode trying to have BM. She was successful and performed clothing management and hygiene with CGA. Pt ambulating with RW into bathroom to stand at sink for self care tasks. Pt washing UB and performing grooming tasks with supervision. Pt standing for LB self care with min guard and use of RW. Pt returning to bed at end of session secondary to fatigue. Call bell and all needed items within reach upon exiting the room.       If plan is discharge home, recommend the following:  A little help with walking and/or transfers;Assistance with cooking/housework;Assist for transportation;Help with stairs or ramp for entrance;A little help with bathing/dressing/bathroom   Equipment Recommendations  Other (comment) (defer to next venue of care)       Precautions / Restrictions Precautions Precautions: Fall Restrictions Weight Bearing Restrictions Per Provider Order: No       Mobility Bed Mobility Overal bed mobility: Modified Independent Bed Mobility: Sit to Supine       Sit to supine: Modified independent (Device/Increase time)        Transfers Overall transfer level: Needs assistance Equipment used: Rolling walker (2 wheels) Transfers: Sit to/from Stand Sit to Stand: Supervision                 Balance Overall balance assessment: Needs assistance Sitting-balance support: Bilateral upper extremity supported Sitting balance-Leahy Scale: Good                                     ADL either performed or assessed with clinical  judgement   ADL Overall ADL's : Needs assistance/impaired     Grooming: Wash/dry hands;Oral care;Wash/dry face;Standing;Supervision/safety   Upper Body Bathing: Supervision/ safety;Standing   Lower Body Bathing: Contact guard assist;Sit to/from stand           Toilet Transfer: Supervision/safety;Rolling walker (2 wheels);Regular Toilet   Toileting- Clothing Manipulation and Hygiene: Contact guard assist              Extremity/Trunk Assessment Upper Extremity Assessment Upper Extremity Assessment: Generalized weakness   Lower Extremity Assessment Lower Extremity Assessment: Generalized weakness   Cervical / Trunk Assessment Cervical / Trunk Assessment: Normal    Vision Baseline Vision/History: 1 Wears glasses Patient Visual Report: No change from baseline           Communication Communication Communication: No apparent difficulties   Cognition Arousal: Alert Behavior During Therapy: WFL for tasks assessed/performed                                 Following commands: Intact        Cueing   Cueing Techniques: Verbal cues             Pertinent Vitals/ Pain       Pain Assessment Pain Assessment: No/denies pain         Frequency  Min 2X/week        Progress Toward Goals  OT Goals(current goals can now  be found in the care plan section)  Progress towards OT goals: Progressing toward goals      AM-PAC OT "6 Clicks" Daily Activity     Outcome Measure   Help from another person eating meals?: None Help from another person taking care of personal grooming?: None Help from another person toileting, which includes using toliet, bedpan, or urinal?: A Little Help from another person bathing (including washing, rinsing, drying)?: A Little Help from another person to put on and taking off regular upper body clothing?: None Help from another person to put on and taking off regular lower body clothing?: A Little 6 Click Score: 21     End of Session Equipment Utilized During Treatment: Rolling walker (2 wheels)  OT Visit Diagnosis: Unsteadiness on feet (R26.81);Muscle weakness (generalized) (M62.81);Pain   Activity Tolerance Patient tolerated treatment well   Patient Left in bed;with call bell/phone within reach;with bed alarm set   Nurse Communication Mobility status        Time: 4098-1191 OT Time Calculation (min): 15 min  Charges: OT General Charges $OT Visit: 1 Visit OT Treatments $Self Care/Home Management : 8-22 mins  .

## 2024-02-05 NOTE — Progress Notes (Signed)
 Progress Note   Patient: Abigail Molina ZOX:[id number] DOB: [birth year] DOA: 01/26/2024     10 DOS: the patient was seen and examined on 02/05/2024   Brief hospital course: 58 y.o. female admitted with Acute Metabolic Encephalopathy and Severe Sepsis with Septic Shock in the setting of Klebsiella Oxytoca Bacteremia due to UTI and suspected pneumonia, along with NSTEMI, Acute Kidney Injury and Hyponatremia. Required intubation and mechanical ventilation, extubated 2/26.      Principal Problem:   Septic shock (HCC) Active Problems:   Acute metabolic encephalopathy   Pressure injury of skin   NSTEMI (non-ST elevated myocardial infarction) (HCC)   Septicemia (HCC)   Acute respiratory failure with hypoxemia (HCC)   Hyponatremia   Assessment and Plan: Acute respiratory failure with hypoxemia requiring mechanical ventilation. Acute metabolic encephalopathy, not toxic encephalopathy. Obstructive sleep apnea. Patient developed acute respiratory failure, most likely multifactorial due to altered mental status, obstruct sleep apnea, septic shock. I do not see a pneumonia on chest x-ray. Condition had improved, currently off oxygen.   #NSTEMI #Intermittent wide complex tachycardia  #Intermittent vtach #Intermittent bradycardia  Hx: HTN and HLD  Echocardiogram showed EF greater than 55% Continue aspirin 81 mg daily, start Crestor 20 mg daily -Hold outpatient antihypertensives and diuretic therapy  Cardiologist planning cardiac catheterization as an outpatient procedure   #Severe Sepsis with septic shock-present on admission due to bacteremia with Klebsiella #Klebsiella Oxytoca BACTEREMIA Klebsiella UTI Patient initially required vasopressor in the ICU, condition has improved. Antibiotics have been adjusted to p.o. Bactrim by infectious disease to complete a total of 2 weeks course for complicated UTI with last dose on 02/08/2024     #Severe hyponatremia ~ IMPROVed #Acute kidney injury  secondary to ATN  ~resolved. #Non anion gap metabolic acidosis ~resolved. #Hyperkalemia ~ RESOLVED #Lactic acidosis resolved. Hypophosphatemia. Potassium is low again, give 80 mEq oral KCl for potassium 3.3.  Phosphorus 2.4, will give oral Neutra-Phos. Magnesium 1.7, give 2 g of magnesium sulfate. Will recheck her labs on Sunday.   #NASH cirrhosis  #Portal hypertension  Hx: Esophageal varices  Continue outpatient lactulose and rifaximin    #Anemia of chronic disease. Thrombocytopenia secondary to sepsis. She does not have iron deficiency, prior B12 level was elevated.  Appear to be secondary to anemia of chronic disease.  This has been stable.   #Type II diabetes mellitus  Monitor glucose level closely Continue insulin therapy   #Stage II pressure ulcer present on admission  Continue wound care management      Subjective:  Patient doing well today, no complaint.  Physical Exam: Vitals:   02/05/24 0358 02/05/24 0420 02/05/24 0827 02/05/24 0827  BP: 110/60  (!) 98/57 (!) 98/57  Pulse: 92  100 99  Resp: 16  16 16   Temp: 98.4 F (36.9 C)  97.8 F (36.6 C) 97.8 F (36.6 C)  TempSrc: Oral  Oral Oral  SpO2: 100%  100% 97%  Weight:  93 kg    Height:       General exam: Appears calm and comfortable  Respiratory system: Clear to auscultation. Respiratory effort normal. Cardiovascular system: S1 & S2 heard, RRR. No JVD, murmurs, rubs, gallops or clicks. No pedal edema. Gastrointestinal system: Abdomen is nondistended, soft and nontender. No organomegaly or masses felt. Normal bowel sounds heard. Central nervous system: Alert and oriented. No focal neurological deficits. Extremities: Symmetric 5 x 5 power. Skin: No rashes, lesions or ulcers Psychiatry: Judgement and insight appear normal. Mood & affect appropriate.    Data  Reviewed:  Lab results reviewed.  Family Communication: none  Disposition: Status is: Inpatient Remains inpatient appropriate because: Unsafe  discharge option.     Time spent: 35 minutes  Author: Marrion Coy, MD 02/05/2024 12:33 PM  For on call review [email or web address].

## 2024-02-05 NOTE — TOC Progression Note (Addendum)
 Transition of Care [facility]) - Progression Note    Patient Details  Name: Abigail Molina MRN: [id number] Date of Birth: 1965-12-31  [facility] CM/SW Contact  Liliana Cline, LCSW Phone Number: 02/05/2024, 9:02 AM  Clinical Narrative:    CSW called Dabe at [employer] to follow up on insurance auth. Left a VM requesting a return call. 9:45- Per Dabe, Berkley Harvey is still pending and they do not accept weekend admissions.       Expected Discharge Plan and Services                                               Social Determinants of Health (SDOH) Interventions SDOH Screenings   Food Insecurity: Patient Unable To Answer (01/26/2024)  Housing: Patient Unable To Answer (01/26/2024)  Transportation Needs: Patient Unable To Answer (01/26/2024)  Utilities: Patient Unable To Answer (01/26/2024)  Depression (PHQ2-9): High Risk (11/02/2023)  Financial Resource Strain: Low Risk  (12/16/2023)   Received from [facility]  Physical Activity: Insufficiently Active (01/09/2021)   Received from [facility], [facility]  Social Connections: Moderately Isolated (09/02/2018)  Stress: Stress Concern Present (09/17/2022)   Received from [facility], [facility]  Tobacco Use: Low Risk  (01/26/2024)  Recent Concern: Tobacco Use - Medium Risk (01/04/2024)   Received from [facility] Literacy: Low Risk  (01/09/2021)   Received from [facility], [facility]    Readmission Risk Interventions     No data to display

## 2024-02-05 NOTE — Plan of Care (Signed)

## 2024-02-06 DIAGNOSIS — G9341 Metabolic encephalopathy: Secondary | ICD-10-CM | POA: Diagnosis not present

## 2024-02-06 DIAGNOSIS — J9601 Acute respiratory failure with hypoxia: Secondary | ICD-10-CM | POA: Diagnosis not present

## 2024-02-06 DIAGNOSIS — A419 Sepsis, unspecified organism: Secondary | ICD-10-CM | POA: Diagnosis not present

## 2024-02-06 DIAGNOSIS — R6521 Severe sepsis with septic shock: Secondary | ICD-10-CM | POA: Diagnosis not present

## 2024-02-06 LAB — GLUCOSE, CAPILLARY
Glucose-Capillary: 119 mg/dL — ABNORMAL HIGH (ref 70–99)
Glucose-Capillary: 169 mg/dL — ABNORMAL HIGH (ref 70–99)
Glucose-Capillary: 192 mg/dL — ABNORMAL HIGH (ref 70–99)
Glucose-Capillary: 245 mg/dL — ABNORMAL HIGH (ref 70–99)
Glucose-Capillary: 268 mg/dL — ABNORMAL HIGH (ref 70–99)
Glucose-Capillary: 292 mg/dL — ABNORMAL HIGH (ref 70–99)
Glucose-Capillary: 58 mg/dL — ABNORMAL LOW (ref 70–99)

## 2024-02-06 LAB — PHOSPHORUS: Phosphorus: 2.1 mg/dL — ABNORMAL LOW (ref 2.5–4.6)

## 2024-02-06 LAB — MAGNESIUM: Magnesium: 2 mg/dL (ref 1.7–2.4)

## 2024-02-06 MED ORDER — LOPERAMIDE HCL 2 MG PO CAPS
4.0000 mg | ORAL_CAPSULE | Freq: Once | ORAL | Status: AC
  Administered 2024-02-06: 4 mg via ORAL
  Filled 2024-02-06: qty 2

## 2024-02-06 MED ORDER — INSULIN GLARGINE 100 UNIT/ML ~~LOC~~ SOLN
10.0000 [IU] | Freq: Every day | SUBCUTANEOUS | Status: DC
  Administered 2024-02-06 – 2024-02-07 (×2): 10 [IU] via SUBCUTANEOUS
  Filled 2024-02-06 (×3): qty 0.1

## 2024-02-06 NOTE — Progress Notes (Signed)
 Progress Note   Patient: Abigail Molina ZOX:[id number] DOB: 1966-06-25 DOA: 01/26/2024     11 DOS: the patient was seen and examined on 02/06/2024   Brief hospital course: 58 y.o. female admitted with Acute Metabolic Encephalopathy and Severe Sepsis with Septic Shock in the setting of Klebsiella Oxytoca Bacteremia due to UTI and suspected pneumonia, along with NSTEMI, Acute Kidney Injury and Hyponatremia. Required intubation and mechanical ventilation, extubated 2/26.      Principal Problem:   Septic shock (HCC) Active Problems:   Acute metabolic encephalopathy   Pressure injury of skin   NSTEMI (non-ST elevated myocardial infarction) (HCC)   Septicemia (HCC)   Acute respiratory failure with hypoxemia (HCC)   Hyponatremia   Assessment and Plan: Acute respiratory failure with hypoxemia requiring mechanical ventilation. Acute metabolic encephalopathy, not toxic encephalopathy. Obstructive sleep apnea. Patient developed acute respiratory failure, most likely multifactorial due to altered mental status, obstruct sleep apnea, septic shock. I do not see a pneumonia on chest x-ray. Condition had improved, currently off oxygen.   #NSTEMI #Intermittent wide complex tachycardia  #Intermittent vtach #Intermittent bradycardia  Hx: HTN and HLD  Echocardiogram showed EF greater than 55% Continue aspirin 81 mg daily, start Crestor 20 mg daily -Hold outpatient antihypertensives and diuretic therapy  Cardiologist planning cardiac catheterization as an outpatient procedure   #Severe Sepsis with septic shock-present on admission due to bacteremia with Klebsiella #Klebsiella Oxytoca BACTEREMIA Klebsiella UTI Patient initially required vasopressor in the ICU, condition has improved. Antibiotics have been adjusted to p.o. Bactrim by infectious disease to complete a total of 2 weeks course for complicated UTI with last dose on 02/08/2024     #Severe hyponatremia ~ IMPROVed #Acute kidney injury  secondary to ATN  ~resolved. #Non anion gap metabolic acidosis ~resolved. #Hyperkalemia ~ RESOLVED #Lactic acidosis resolved. Hypophosphatemia. Potassium is low again, give 80 mEq oral KCl for potassium 3.3.  Phosphorus 2.4, will give oral Neutra-Phos. Magnesium 1.7, give 2 g of magnesium sulfate. Will recheck her labs on Sunday.   #NASH cirrhosis  #Portal hypertension  Hx: Esophageal varices  Continue outpatient lactulose and rifaximin    #Anemia of chronic disease. Thrombocytopenia secondary to sepsis. She does not have iron deficiency, prior B12 level was elevated.  Appear to be secondary to anemia of chronic disease.  This has been stable.   #Type II diabetes mellitus with hypoglycemia. Patient developed hypoglycemia in the morning of 3/8, reduced long-acting insulin to 10 units.  Continue to follow glucose.   #Stage II pressure ulcer present on admission  Continue wound care management  Class II obesity.  Morbid obesity due to comorbidities. BMI 36.5 with comorbidities.    Subjective:  Patient doing well today, essentially no complaint.  Physical Exam: Vitals:   02/05/24 2014 02/06/24 0319 02/06/24 0419 02/06/24 0810  BP: (!) 114/55 (!) 105/54  (!) 106/54  Pulse: 88 87  (!) 103  Resp: 20 20    Temp: (!) 97.5 F (36.4 C) 97.6 F (36.4 C)  98.3 F (36.8 C)  TempSrc:  Oral  Oral  SpO2: 100% 99%    Weight:   96.5 kg   Height:       General exam: Appears calm and comfortable  Respiratory system: Clear to auscultation. Respiratory effort normal. Cardiovascular system: S1 & S2 heard, RRR. No JVD, murmurs, rubs, gallops or clicks. No pedal edema. Gastrointestinal system: Abdomen is nondistended, soft and nontender. No organomegaly or masses felt. Normal bowel sounds heard. Central nervous system: Alert and oriented. No  focal neurological deficits. Extremities: Symmetric 5 x 5 power. Skin: No rashes, lesions or ulcers Psychiatry: Judgement and insight appear normal.  Mood & affect appropriate.    Data Reviewed:  No new labs today.  Family Communication: None  Disposition: Status is: Inpatient Remains inpatient appropriate because: Unsafe discharge.  Pending nursing home placement.     Time spent: 35 minutes  Author: Marrion Coy, MD 02/06/2024 12:01 PM  For on call review [email or web address].

## 2024-02-06 NOTE — Plan of Care (Signed)

## 2024-02-07 DIAGNOSIS — G9341 Metabolic encephalopathy: Secondary | ICD-10-CM | POA: Diagnosis not present

## 2024-02-07 DIAGNOSIS — A419 Sepsis, unspecified organism: Secondary | ICD-10-CM | POA: Diagnosis not present

## 2024-02-07 DIAGNOSIS — R6521 Severe sepsis with septic shock: Secondary | ICD-10-CM | POA: Diagnosis not present

## 2024-02-07 DIAGNOSIS — J9601 Acute respiratory failure with hypoxia: Secondary | ICD-10-CM | POA: Diagnosis not present

## 2024-02-07 LAB — GLUCOSE, CAPILLARY
Glucose-Capillary: 132 mg/dL — ABNORMAL HIGH (ref 70–99)
Glucose-Capillary: 85 mg/dL (ref 70–99)
Glucose-Capillary: 127 mg/dL — ABNORMAL HIGH (ref 70–99)
Glucose-Capillary: 247 mg/dL — ABNORMAL HIGH (ref 70–99)
Glucose-Capillary: 253 mg/dL — ABNORMAL HIGH (ref 70–99)
Glucose-Capillary: 196 mg/dL — ABNORMAL HIGH (ref 70–99)

## 2024-02-07 LAB — BASIC METABOLIC PANEL
Anion gap: 6 (ref 5–15)
BUN: 22 mg/dL — ABNORMAL HIGH (ref 6–20)
CO2: 24 mmol/L (ref 22–32)
Calcium: 8.9 mg/dL (ref 8.9–10.3)
Chloride: 102 mmol/L (ref 98–111)
Creatinine, Ser: 1.26 mg/dL — ABNORMAL HIGH (ref 0.44–1.00)
GFR, Estimated: 49 mL/min — ABNORMAL LOW (ref >60)
Glucose, Bld: 92 mg/dL (ref 70–99)
Potassium: 3.2 mmol/L — ABNORMAL LOW (ref 3.5–5.1)
Sodium: 132 mmol/L — ABNORMAL LOW (ref 135–145)

## 2024-02-07 LAB — MAGNESIUM: Magnesium: 1.8 mg/dL (ref 1.7–2.4)

## 2024-02-07 LAB — PHOSPHORUS: Phosphorus: 2.2 mg/dL — ABNORMAL LOW (ref 2.5–4.6)

## 2024-02-07 MED ORDER — POTASSIUM CHLORIDE CRYS ER 20 MEQ PO TBCR
40.0000 meq | EXTENDED_RELEASE_TABLET | ORAL | Status: AC
  Administered 2024-02-07 (×3): 40 meq via ORAL
  Filled 2024-02-07 (×2): qty 2

## 2024-02-07 MED ORDER — POTASSIUM & SODIUM PHOSPHATES 280-160-250 MG PO PACK
2.0000 | PACK | Freq: Three times a day (TID) | ORAL | Status: AC
  Administered 2024-02-07 (×4): 2 via ORAL
  Filled 2024-02-07 (×4): qty 2

## 2024-02-07 NOTE — Plan of Care (Signed)
  Problem: Education: Goal: Knowledge of General Education information will improve Description: Including pain rating scale, medication(s)/side effects and non-pharmacologic comfort measures Outcome: Progressing   Problem: Health Behavior/Discharge Planning: Goal: Ability to manage health-related needs will improve Outcome: Progressing   Problem: Clinical Measurements: Goal: Ability to maintain clinical measurements within normal limits will improve Outcome: Progressing Goal: Will remain free from infection Outcome: Progressing Goal: Diagnostic test results will improve Outcome: Progressing Goal: Respiratory complications will improve Outcome: Progressing Goal: Cardiovascular complication will be avoided Outcome: Progressing   Problem: Activity: Goal: Risk for activity intolerance will decrease Outcome: Progressing   Problem: Nutrition: Goal: Adequate nutrition will be maintained Outcome: Progressing   Problem: Coping: Goal: Level of anxiety will decrease Outcome: Progressing   Problem: Pain Managment: Goal: General experience of comfort will improve and/or be controlled Outcome: Progressing   Problem: Safety: Goal: Ability to remain free from injury will improve Outcome: Progressing   Problem: Education: Goal: Ability to describe self-care measures that may prevent or decrease complications (Diabetes Survival Skills Education) will improve Outcome: Progressing Goal: Individualized Educational Video(s) Outcome: Progressing   Problem: Fluid Volume: Goal: Ability to maintain a balanced intake and output will improve Outcome: Progressing   Problem: Health Behavior/Discharge Planning: Goal: Ability to identify and utilize available resources and services will improve Outcome: Progressing Goal: Ability to manage health-related needs will improve Outcome: Progressing

## 2024-02-07 NOTE — Progress Notes (Signed)
 Progress Note   Patient: Abigail Molina AVW:[id number] DOB: [birth year] DOA: 01/26/2024     12 DOS: the patient was seen and examined on 02/07/2024   Brief hospital course: 58 y.o. female admitted with Acute Metabolic Encephalopathy and Severe Sepsis with Septic Shock in the setting of Klebsiella Oxytoca Bacteremia due to UTI and suspected pneumonia, along with NSTEMI, Acute Kidney Injury and Hyponatremia. Required intubation and mechanical ventilation, extubated 2/26.      Principal Problem:   Septic shock (HCC) Active Problems:   Acute metabolic encephalopathy   Pressure injury of skin   NSTEMI (non-ST elevated myocardial infarction) (HCC)   Septicemia (HCC)   Acute respiratory failure with hypoxemia (HCC)   Hyponatremia   Assessment and Plan: Acute respiratory failure with hypoxemia requiring mechanical ventilation. Acute metabolic encephalopathy, not toxic encephalopathy. Obstructive sleep apnea. Patient developed acute respiratory failure, most likely multifactorial due to altered mental status, obstruct sleep apnea, septic shock. I do not see a pneumonia on chest x-ray. Condition had improved, currently off oxygen.   #NSTEMI #Intermittent wide complex tachycardia  #Intermittent vtach #Intermittent bradycardia  Hx: HTN and HLD  Echocardiogram showed EF greater than 55% Continue aspirin 81 mg daily, start Crestor 20 mg daily -Hold outpatient antihypertensives and diuretic therapy  Cardiologist planning cardiac catheterization as an outpatient procedure   #Severe Sepsis with septic shock-present on admission due to bacteremia with Klebsiella #Klebsiella Oxytoca BACTEREMIA Klebsiella UTI Patient initially required vasopressor in the ICU, condition has improved. Antibiotics have been adjusted to p.o. Bactrim by infectious disease to complete a total of 2 weeks course for complicated UTI with last dose on 02/08/2024     #Severe hyponatremia ~ IMPROVed #Acute kidney injury  secondary to ATN  ~resolved. #Non anion gap metabolic acidosis ~resolved. #Hyperkalemia ~ RESOLVED #Lactic acidosis resolved. Hypophosphatemia. Patient developed hypophosphatemia and hypokalemia again.  Replete again.  Recheck levels tomorrow.   #NASH cirrhosis  #Portal hypertension  Hx: Esophageal varices  Continue outpatient lactulose and rifaximin    #Anemia of chronic disease. Thrombocytopenia secondary to sepsis. She does not have iron deficiency, prior B12 level was elevated.  Appear to be secondary to anemia of chronic disease.  This has been stable.   #Type II diabetes mellitus with hypoglycemia. Patient developed hypoglycemia in the morning of 3/8, reduced long-acting insulin to 10 units.  Continue to follow glucose.   #Stage II pressure ulcer present on admission  Continue wound care management   Class II obesity.  Morbid obesity due to comorbidities. BMI 36.5 with comorbidities.            Subjective:  Doing well today, normal bowel movements.  Sleeping well.  No short of breath.  Physical Exam: Vitals:   02/06/24 1947 02/07/24 0351 02/07/24 0406 02/07/24 0734  BP: 102/68 124/68  (!) 115/51  Pulse: 91 92  84  Resp: 16 18  16   Temp: 97.8 F (36.6 C) 97.8 F (36.6 C)  98.5 F (36.9 C)  TempSrc: Oral   Oral  SpO2: 99% 98%  98%  Weight:   93 kg   Height:       General exam: Appears calm and comfortable  Respiratory system: Clear to auscultation. Respiratory effort normal. Cardiovascular system: S1 & S2 heard, RRR. No JVD, murmurs, rubs, gallops or clicks. No pedal edema. Gastrointestinal system: Abdomen is nondistended, soft and nontender. No organomegaly or masses felt. Normal bowel sounds heard. Central nervous system: Alert and oriented. No focal neurological deficits. Extremities: Symmetric 5 x 5  power. Skin: No rashes, lesions or ulcers Psychiatry: Judgement and insight appear normal. Mood & affect appropriate.    Data Reviewed:  Lab results  reviewed.  Family Communication: None  Disposition: Status is: Inpatient Remains inpatient appropriate because: Unsafe discharge.     Time spent: 35 minutes  Author: Marrion Coy, MD 02/07/2024 11:50 AM  For on call review [email or web address].

## 2024-02-08 DIAGNOSIS — G9341 Metabolic encephalopathy: Secondary | ICD-10-CM | POA: Diagnosis not present

## 2024-02-08 DIAGNOSIS — I214 Non-ST elevation (NSTEMI) myocardial infarction: Secondary | ICD-10-CM | POA: Diagnosis not present

## 2024-02-08 DIAGNOSIS — J9601 Acute respiratory failure with hypoxia: Secondary | ICD-10-CM | POA: Diagnosis not present

## 2024-02-08 DIAGNOSIS — A419 Sepsis, unspecified organism: Secondary | ICD-10-CM | POA: Diagnosis not present

## 2024-02-08 LAB — BASIC METABOLIC PANEL
Anion gap: 11 (ref 5–15)
BUN: 21 mg/dL — ABNORMAL HIGH (ref 6–20)
CO2: 22 mmol/L (ref 22–32)
Calcium: 8.9 mg/dL (ref 8.9–10.3)
Chloride: 103 mmol/L (ref 98–111)
Creatinine, Ser: 1.34 mg/dL — ABNORMAL HIGH (ref 0.44–1.00)
GFR, Estimated: 46 mL/min — ABNORMAL LOW (ref >60)
Glucose, Bld: 93 mg/dL (ref 70–99)
Potassium: 3.9 mmol/L (ref 3.5–5.1)
Sodium: 136 mmol/L (ref 135–145)

## 2024-02-08 LAB — GLUCOSE, CAPILLARY
Glucose-Capillary: 63 mg/dL — ABNORMAL LOW (ref 70–99)
Glucose-Capillary: 71 mg/dL (ref 70–99)
Glucose-Capillary: 82 mg/dL (ref 70–99)
Glucose-Capillary: 93 mg/dL (ref 70–99)
Glucose-Capillary: 142 mg/dL — ABNORMAL HIGH (ref 70–99)

## 2024-02-08 LAB — CBC
HCT: 26.9 % — ABNORMAL LOW (ref 36.0–46.0)
Hemoglobin: 8.4 g/dL — ABNORMAL LOW (ref 12.0–15.0)
MCH: 25.7 pg — ABNORMAL LOW (ref 26.0–34.0)
MCHC: 31.2 g/dL (ref 30.0–36.0)
MCV: 82.3 fL (ref 80.0–100.0)
Platelets: 126 10*3/uL — ABNORMAL LOW (ref 150–400)
RBC: 3.27 MIL/uL — ABNORMAL LOW (ref 3.87–5.11)
RDW: 19 % — ABNORMAL HIGH (ref 11.5–15.5)
WBC: 5.6 10*3/uL (ref 4.0–10.5)
nRBC: 0 % (ref 0.0–0.2)

## 2024-02-08 LAB — PHOSPHORUS: Phosphorus: 2.7 mg/dL (ref 2.5–4.6)

## 2024-02-08 MED ORDER — ROSUVASTATIN CALCIUM 20 MG PO TABS: 20.0000 mg | ORAL_TABLET | Freq: Every day | ORAL | 0 refills | Status: DC

## 2024-02-08 MED ORDER — ASCORBIC ACID 500 MG PO TABS: 500.0000 mg | ORAL_TABLET | Freq: Two times a day (BID) | ORAL | 0 refills | Status: DC

## 2024-02-08 MED ORDER — ORAL CARE MOUTH RINSE: 15.0000 mL | OROMUCOSAL | Status: DC | PRN

## 2024-02-08 MED ORDER — THIAMINE HCL 100 MG PO TABS: 100.0000 mg | ORAL_TABLET | Freq: Every day | ORAL | 0 refills | Status: DC

## 2024-02-08 MED ORDER — ADULT MULTIVITAMIN W/MINERALS CH: 1.0000 | ORAL_TABLET | Freq: Every day | ORAL | 0 refills | Status: DC

## 2024-02-08 MED ORDER — LANTUS SOLOSTAR 100 UNIT/ML ~~LOC~~ SOPN: 6.0000 [IU] | PEN_INJECTOR | Freq: Every day | SUBCUTANEOUS | Status: DC

## 2024-02-08 MED ORDER — SULFAMETHOXAZOLE-TRIMETHOPRIM 800-160 MG PO TABS: 1.0000 | ORAL_TABLET | Freq: Two times a day (BID) | ORAL | 0 refills | Status: AC

## 2024-02-08 MED ORDER — ZINC SULFATE 220 (50 ZN) MG PO CAPS: 220.0000 mg | ORAL_CAPSULE | Freq: Every day | ORAL | 0 refills | Status: DC

## 2024-02-08 MED ORDER — MIDODRINE HCL 10 MG PO TABS: 5.0000 mg | ORAL_TABLET | Freq: Three times a day (TID) | ORAL | 0 refills | Status: DC

## 2024-02-08 NOTE — Inpatient Diabetes Management (Signed)
 Inpatient Diabetes Program Recommendations  AACE/ADA: New Consensus Statement on Inpatient Glycemic Control (2015)  Target Ranges:  Prepandial:   less than 140 mg/dL      Peak postprandial:   less than 180 mg/dL (1-2 hours)      Critically ill patients:  140 - 180 mg/dL    Latest Reference Range & Units 02/07/24 07:31 02/07/24 12:35 02/07/24 18:06 02/07/24 20:26  Glucose-Capillary 70 - 99 mg/dL 85  5 units Novolog  161 (H)  8 units Novolog  247 (H)  12 units Novolog  253 (H)  11 units Novolog  10 units Lantus     Latest Reference Range & Units 02/07/24 23:13 02/08/24 03:26 02/08/24 03:46  Glucose-Capillary 70 - 99 mg/dL 096 (H)  4 units Novolog  63 (L) 71  (H): Data is abnormally high (L): Data is abnormally low    Home DM Meds: Lantus 66 units daily Humalog Metformin 1000 mg BID Invokana 300 mg daily   Current Orders:  Lantus 10 units at bedtime Novolog 5 units TID with meals Novolog Resistant Correction Scale/ SSI (0-20 units) Q4 hours     MD- Note Hypoglycemia at 3am today.  Pt had received 10 units Lantus at bedtime and 4 units Novolog SSI at Midnight.  Please consider:  1. Reduce Lantus to 8 units at bedtime  2. Reduce/Adjust the Novolog SSI to the 0-15 unit (Moderate) scale TID AC + HS    --Will follow patient during hospitalization--  Ambrose Finland RN, MSN, CDCES Diabetes Coordinator Inpatient Glycemic Control Team Team Pager: [phone] (8a-5p)

## 2024-02-08 NOTE — Discharge Summary (Addendum)
 Physician Discharge Summary   Patient: Abigail Molina MRN: [id number] DOB: 04-12-1966  Admit date:     01/26/2024  Discharge date: 02/08/24  Discharge Physician: Marrion Coy   PCP: [facility]   Recommendations at discharge:   Follow-up with PCP in 1 week. Follow-up with cardiology as scheduled.  Discharge Diagnoses: Principal Problem:   Septic shock (HCC) Active Problems:   Acute metabolic encephalopathy   Pressure injury of skin   NSTEMI (non-ST elevated myocardial infarction) (HCC)   Septicemia (HCC)   Acute respiratory failure with hypoxemia (HCC)   Hyponatremia  Resolved Problems:   * No resolved hospital problems. *  Hospital Course: 58 y.o. female admitted with Acute Metabolic Encephalopathy and Severe Sepsis with Septic Shock in the setting of Klebsiella Oxytoca Bacteremia due to UTI and suspected pneumonia, along with NSTEMI, Acute Kidney Injury and Hyponatremia. Required intubation and mechanical ventilation, extubated 2/26.   Patient was In the hospital while waiting for nursing home placement.  However, patient has made significant progress, she no longer want nursing home, will set up home care.  Assessment and Plan: Acute respiratory failure with hypoxemia requiring mechanical ventilation. Acute metabolic encephalopathy, not toxic encephalopathy. Obstructive sleep apnea. Pneumonia ruled out. Patient developed acute respiratory failure, most likely multifactorial due to altered mental status, obstruct sleep apnea, septic shock. I do not see a pneumonia on chest x-ray. Condition had improved, currently off oxygen.   #NSTEMI #Intermittent wide complex tachycardia  #Intermittent vtach #Intermittent bradycardia  Hx: HTN and HLD  Echocardiogram showed EF greater than 55% Continue aspirin 81 mg daily, start Crestor 20 mg daily -Hold outpatient antihypertensives and diuretic therapy  Cardiologist planning cardiac catheterization as an outpatient  procedure Was not able to start beta-blocker due to low blood pressure, patient is on midodrine.  Patient be followed by cardiology in the near future.   #Severe Sepsis with septic shock-present on admission due to bacteremia with Klebsiella #Klebsiella Oxytoca BACTEREMIA Klebsiella UTI Patient initially required vasopressor in the ICU, condition has improved. Antibiotics have been adjusted to p.o. Bactrim by infectious disease to complete a total of 2 weeks course for complicated UTI with last dose on 02/08/2024     #Severe hyponatremia ~ IMPROVed #Acute kidney injury secondary to ATN  ~resolved. #Non anion gap metabolic acidosis ~resolved. #Hyperkalemia ~ RESOLVED #Lactic acidosis resolved. Hypophosphatemia. Condition all improved.  #NASH cirrhosis  #Portal hypertension  Hx: Esophageal varices  Continue outpatient lactulose and rifaximin    #Anemia of chronic disease. Thrombocytopenia secondary to sepsis. She does not have iron deficiency, prior B12 level was elevated.  Appear to be secondary to anemia of chronic disease.  This has been stable.   #Type II diabetes mellitus with hypoglycemia. Patient had some hypoglycemia in the hospital, long-acting insulin further reduced to 6 units daily.  Follow-up with PCP for dose adjustment.   #Stage II pressure ulcer present on admission  Pressure Injury 01/26/24 Buttocks Upper;Left Stage 2 -  Partial thickness loss of dermis presenting as a shallow open injury with a red, pink wound bed without slough. (Active)  01/26/24 1241  Location: Buttocks  Location Orientation: Upper;Left  Staging: Stage 2 -  Partial thickness loss of dermis presenting as a shallow open injury with a red, pink wound bed without slough.  Wound Description (Comments):   Present on Admission: Yes     Pressure Injury 01/27/24 Sacrum Medial Stage 3 -  Full thickness tissue loss. Subcutaneous fat may be visible but bone, tendon or muscle  are NOT exposed. (Active)   01/27/24 0981  Location: Sacrum  Location Orientation: Medial  Staging: Stage 3 -  Full thickness tissue loss. Subcutaneous fat may be visible but bone, tendon or muscle are NOT exposed.  Wound Description (Comments):   Present on Admission: Yes   Can be followed by home care RN.     Class II obesity.  Morbid obesity due to comorbidities. BMI 36.5 with comorbidities.           Consultants: Cardiology. Procedures performed: None  Disposition: Home health Diet recommendation:  Discharge Diet Orders (From admission, onward)     Start     Ordered   02/08/24 0000  Diet - low sodium heart healthy        02/08/24 1019           Cardiac diet DISCHARGE MEDICATION: Allergies as of 02/08/2024   No Known Allergies      Medication List     STOP taking these medications    canagliflozin 300 MG Tabs tablet Commonly known as: INVOKANA   carvedilol 6.25 MG tablet Commonly known as: COREG   chlorhexidine 0.12 % solution Commonly known as: PERIDEX   clotrimazole 10 MG troche Commonly known as: MYCELEX   Dexcom G7 Receiver Devi   insulin lispro 100 UNIT/ML KwikPen Commonly known as: HUMALOG   scopolamine 1 MG/3DAYS Commonly known as: TRANSDERM-SCOP   Semglee (yfgn) 100 UNIT/ML Pen Generic drug: insulin glargine-yfgn   spironolactone 100 MG tablet Commonly known as: ALDACTONE   Trulicity 1.5 MG/0.5ML Soaj Generic drug: Dulaglutide   zolpidem 5 MG tablet Commonly known as: AMBIEN       TAKE these medications    Accu-Chek Softclix Lancets lancets SMARTSIG:Topical   ascorbic acid 500 MG tablet Commonly known as: VITAMIN C Take 1 tablet (500 mg total) by mouth 2 (two) times daily.   aspirin 81 MG tablet Take 81 mg by mouth daily.   Baclofen 5 MG Tabs Take 2 tablets by mouth at bedtime.   BD Buyer, retail Use as directed to dispose of Cosentyx pens.   buPROPion 150 MG 24 hr tablet Commonly known as: WELLBUTRIN XL Take 1 tablet (150  mg total) by mouth daily.   citalopram 20 MG tablet Commonly known as: CELEXA Take 20 mg by mouth daily.   Cosentyx Sensoready (300 MG) 150 MG/ML Soaj Generic drug: Secukinumab (300 MG Dose) Inject into the skin. What changed: Another medication with the same name was removed. Continue taking this medication, and follow the directions you see here.   FreeStyle Libre 3 Sensor Misc by Does not apply route daily. What changed: Another medication with the same name was removed. Continue taking this medication, and follow the directions you see here.   furosemide 40 MG tablet Commonly known as: LASIX Take 40 mg by mouth 2 (two) times daily.   gabapentin 300 MG capsule Commonly known as: NEURONTIN Take by mouth once.   Insulin Pen Needle 32G X 6 MM Misc 1 Syringe by Does not apply route. Use with Victoza.   BD Pen Needle Nano 2nd Gen 32G X 4 MM Misc Generic drug: Insulin Pen Needle USE 1 NIGHTLY   lactulose 10 GM/15ML solution Commonly known as: CHRONULAC SMARTSIG:Milliliter(s) By Mouth   Lantus SoloStar 100 UNIT/ML Solostar Pen Generic drug: insulin glargine Inject 6 Units into the skin daily. What changed: how much to take   levETIRAcetam 500 MG tablet Commonly known as: KEPPRA Take 750 mg by mouth 2 (two)  times daily.   MAGnesium-Oxide 400 (240 Mg) MG tablet Generic drug: magnesium oxide Take 2 tablets by mouth 2 (two) times daily.   metFORMIN 500 MG 24 hr tablet Commonly known as: GLUCOPHAGE-XR Take 1,000 mg by mouth 2 (two) times daily.   midodrine 10 MG tablet Commonly known as: PROAMATINE Take 0.5 tablets (5 mg total) by mouth 3 (three) times daily with meals.   mirtazapine 15 MG tablet Commonly known as: REMERON Take 1 tablet (15 mg total) by mouth at bedtime.   multivitamin with minerals Tabs tablet Take 1 tablet by mouth daily. Start taking on: February 09, 2024   naltrexone 50 MG tablet Commonly known as: DEPADE Take 50 mg by mouth daily.    ondansetron 4 MG disintegrating tablet Commonly known as: ZOFRAN-ODT Dissolve 1 tablet (4 mg total) in the mouth every twelve (12) hours as needed for nausea.   pantoprazole 40 MG tablet Commonly known as: PROTONIX TAKE ONE TABLET BY MOUTH EVERY DAY   promethazine 12.5 MG tablet Commonly known as: PHENERGAN Take by mouth.   rosuvastatin 20 MG tablet Commonly known as: CRESTOR Take 1 tablet (20 mg total) by mouth daily. Start taking on: February 09, 2024   Slow Fe 142 (45 Fe) MG Tbcr Generic drug: Ferrous Sulfate Take by mouth.   sulfamethoxazole-trimethoprim 800-160 MG tablet Commonly known as: BACTRIM DS Take 1 tablet by mouth every 12 (twelve) hours for 2 days.   thiamine 100 MG tablet Commonly known as: VITAMIN B1 Take 1 tablet (100 mg total) by mouth daily. Start taking on: February 09, 2024   Xifaxan 550 MG Tabs tablet Generic drug: rifaximin Take 550 mg by mouth 2 (two) times daily.   zinc sulfate (50mg  elemental zinc) 220 (50 Zn) MG capsule Take 1 capsule (220 mg total) by mouth daily. Start taking on: February 09, 2024               Discharge Care Instructions  (From admission, onward)           Start     Ordered   02/08/24 0000  Discharge wound care:       Comments: Wound care  Daily      Comments: 1.Cleanse sacral and left buttock wound with saline, cover with single layer of xeroform and top with foam. If stooling frequently DC and use Gerhardt's butt cream instead 2.Use Gerhardt's on the right buttock ICD  Followed by home care RN.   02/08/24 1019            Contact information for follow-up providers     Custovic, Sabina, DO. Go in 1 week(s).   Specialty: Cardiology Contact information: [address] [place] [address] [phone]         [facility] Follow up in 1 week(s).   Specialty: Family Medicine Contact information: [address] [place] [address] [phone]              Contact  information for after-discharge care     Destination     [facility] Preferred SNF .   Service: Skilled Nursing Contact information: [address] [place] [address] [phone]                    Discharge Exam: Filed Weights   02/06/24 0419 02/07/24 0406 02/08/24 0328  Weight: 96.5 kg 93 kg 92 kg   General exam: Appears calm  and comfortable, morbid obese. Respiratory system: Clear to auscultation. Respiratory effort normal. Cardiovascular system: S1 & S2 heard, RRR. No JVD, murmurs, rubs, gallops or clicks. No pedal edema. Gastrointestinal system: Abdomen is nondistended, soft and nontender. No organomegaly or masses felt. Normal bowel sounds heard. Central nervous system: Alert and oriented. No focal neurological deficits. Extremities: Symmetric 5 x 5 power. Skin: No rashes, lesions or ulcers Psychiatry: Judgement and insight appear normal. Mood & affect appropriate.    Condition at discharge: good  The results of significant diagnostics from this hospitalization (including imaging, microbiology, ancillary and laboratory) are listed below for reference.   Imaging Studies: EEG adult Result Date: 01/26/2024 Charlsie Quest, MD     01/26/2024  4:46 PM Patient Name: OREATHA FABRY MRN: [id number] Epilepsy Attending: Charlsie Quest Referring Physician/Provider: Ezequiel Essex, NP Date: 01/26/2024 Duration: 32.31 mins Patient history: 58yo female with ams. EEG to evaluate for seizure Level of alertness: comatose AEDs during EEG study: LEV Technical aspects: This EEG study was done with scalp electrodes positioned according to the 10-20 International system of electrode placement. Electrical activity was reviewed with band pass filter of 1-70Hz , sensitivity of 7 uV/mm, display speed of 54mm/sec with a 60Hz  notched filter applied as appropriate. EEG data were recorded continuously and  digitally stored.  Video monitoring was available and reviewed as appropriate. Description: EEG showed continuous generalized 3 to 5 Hz theta-delta slowing. Hyperventilation and photic stimulation were not performed.   ABNORMALITY - Continuous slow, generalized IMPRESSION: This study is suggestive of severe diffuse encephalopathy. No seizures or epileptiform discharges were seen throughout the recording. Charlsie Quest   ECHOCARDIOGRAM COMPLETE Result Date: 01/26/2024    ECHOCARDIOGRAM REPORT   Patient Name:   RAYLYNNE CUBBAGE Date of Exam: 01/26/2024 Medical Rec #:  [id number]    Height:       64.0 in Accession #:    [id number]   Weight:       212.0 lb Date of Birth:  October 19, 1966    BSA:          2.005 m Patient Age:    57 years     BP:           85/65 mmHg Patient Gender: F            HR:           Not listed in chart bpm. Exam Location:  [facility] Procedure: 2D Echo, Cardiac Doppler and Color Doppler (Both Spectral and Color            Flow Doppler were utilized during procedure). Indications:     Abnormal ECG R94.31  History:         Patient has no prior history of Echocardiogram examinations.                  Risk Factors:Hypertension and Dyslipidemia. Anxiety.  Sonographer:     Cristela Blue Referring Phys:  [id number] Ezequiel Essex Diagnosing Phys: Yvonne Kendall MD  Sonographer Comments: Echo performed with patient supine and on artificial respirator and no parasternal window. Zoll pad over sternum. IMPRESSIONS  1. Left ventricular ejection fraction, by estimation, is >55%. The left ventricle has normal function. Left ventricular endocardial border not optimally defined to evaluate regional wall motion. Left ventricular diastolic parameters were normal.  2. Right ventricular systolic function is normal. The right ventricular size is normal. Tricuspid regurgitation signal is inadequate for assessing PA pressure.  3. The mitral valve is normal in  structure. No evidence of mitral valve regurgitation. No evidence of mitral  stenosis.  4. The aortic valve was not well visualized. Aortic valve regurgitation is not visualized. No aortic stenosis is present. FINDINGS  Left Ventricle: Left ventricular ejection fraction, by estimation, is >55%. The left ventricle has normal function. Left ventricular endocardial border not optimally defined to evaluate regional wall motion. Strain imaging was not performed. The left ventricular internal cavity size was normal in size. There is borderline left ventricular hypertrophy. Left ventricular diastolic parameters were normal. Right Ventricle: The right ventricular size is normal. No increase in right ventricular wall thickness. Right ventricular systolic function is normal. Tricuspid regurgitation signal is inadequate for assessing PA pressure. Left Atrium: Left atrial size was normal in size. Right Atrium: Right atrial size was normal in size. Pericardium: There is no evidence of pericardial effusion. Mitral Valve: The mitral valve is normal in structure. No evidence of mitral valve regurgitation. No evidence of mitral valve stenosis. MV peak gradient, 13.0 mmHg. The mean mitral valve gradient is 4.0 mmHg. Tricuspid Valve: The tricuspid valve is normal in structure. Tricuspid valve regurgitation is trivial. Aortic Valve: The aortic valve was not well visualized. Aortic valve regurgitation is not visualized. No aortic stenosis is present. Aortic valve mean gradient measures 3.0 mmHg. Aortic valve peak gradient measures 5.3 mmHg. Aortic valve area, by VTI measures 3.79 cm. Pulmonic Valve: The pulmonic valve was not well visualized. Pulmonic valve regurgitation is not visualized. No evidence of pulmonic stenosis. Aorta: The aortic root is normal in size and structure. Pulmonary Artery: The pulmonary artery is not well seen. Venous: IVC assessment for right atrial pressure unable to be performed due to mechanical ventilation. IAS/Shunts: No atrial level shunt detected by color flow Doppler. Additional  Comments: 3D imaging was not performed.  LEFT VENTRICLE PLAX 2D LVIDd:         3.50 cm   Diastology LVIDs:         2.60 cm   LV e' medial:    12.90 cm/s LV PW:         1.10 cm   LV E/e' medial:  6.0 LV IVS:        1.00 cm   LV e' lateral:   16.90 cm/s LVOT diam:     2.00 cm   LV E/e' lateral: 4.6 LV SV:         63 LV SV Index:   31 LVOT Area:     3.14 cm  RIGHT VENTRICLE RV Basal diam:  3.40 cm RV Mid diam:    3.50 cm RV S prime:     15.30 cm/s TAPSE (M-mode): 2.3 cm LEFT ATRIUM           Index        RIGHT ATRIUM           Index LA diam:      3.30 cm 1.65 cm/m   RA Area:     15.10 cm LA Vol (A2C): 19.1 ml 9.52 ml/m   RA Volume:   33.10 ml  16.51 ml/m LA Vol (A4C): 26.1 ml 13.01 ml/m  AORTIC VALVE AV Area (Vmax):    2.92 cm AV Area (Vmean):   3.13 cm AV Area (VTI):     3.79 cm AV Vmax:           115.00 cm/s AV Vmean:          85.900 cm/s AV VTI:  0.165 m AV Peak Grad:      5.3 mmHg AV Mean Grad:      3.0 mmHg LVOT Vmax:         107.00 cm/s LVOT Vmean:        85.600 cm/s LVOT VTI:          0.199 m LVOT/AV VTI ratio: 1.21  AORTA Ao Root diam: 3.10 cm MITRAL VALVE MV Area (PHT): 5.75 cm    SHUNTS MV Area VTI:   2.10 cm    Systemic VTI:  0.20 m MV Peak grad:  13.0 mmHg   Systemic Diam: 2.00 cm MV Mean grad:  4.0 mmHg MV Vmax:       1.80 m/s MV Vmean:      88.2 cm/s MV Decel Time: 132 msec MV E velocity: 77.60 cm/s MV A velocity: 93.80 cm/s MV E/A ratio:  0.83 Cristal Deer End MD Electronically signed by Yvonne Kendall MD Signature Date/Time: 01/26/2024/4:06:41 PM    Final    DG Chest Port 1 View Result Date: 01/26/2024 CLINICAL DATA:  Central line readjustment. EXAM: PORTABLE CHEST 1 VIEW COMPARISON:  Chest x-ray from same day at 1247 hours. FINDINGS: The patient remains rotated to the left. Interval retraction of the left internal jugular central venous catheter with the tip now in the distal SVC. Unchanged endotracheal and enteric tubes. Unchanged cardiomegaly and left retrocardiac opacity. No  pleural effusion or pneumothorax. No acute osseous abnormality. IMPRESSION: 1. Interval retraction of the left internal jugular central venous catheter with the tip now in the distal SVC. Electronically Signed   By: Obie Dredge M.D.   On: 01/26/2024 14:11   DG Chest Port 1 View Result Date: 01/26/2024 CLINICAL DATA:  Central line placement. EXAM: PORTABLE CHEST 1 VIEW COMPARISON:  Chest x-ray from same day at 0950 hours. FINDINGS: The patient is rotated to the left. New left internal jugular central venous catheter with tip in the proximal right atrium. Unchanged endotracheal and enteric tubes. Unchanged cardiomegaly. Unchanged retrocardiac left lower lobe density. Clear right lung. No large pleural effusion or pneumothorax. No acute osseous abnormality. IMPRESSION: 1. New left internal jugular central venous catheter with tip in the proximal right atrium. No pneumothorax. 2. Unchanged retrocardiac left lower lobe atelectasis or pneumonia. Electronically Signed   By: Obie Dredge M.D.   On: 01/26/2024 14:10   DG Abdomen 1 View Result Date: 01/26/2024 CLINICAL DATA:  post og tube placement EXAM: ABDOMEN - 1 VIEW COMPARISON:  Chest XR and CT AP, concurrent. FINDINGS: Support lines: Enteric decompression tube, with tube tip and side port within stomach. The imaged bowel gas pattern is normal. Cholecystectomy clips. No radio-opaque calculi or other significant radiographic abnormality. IMPRESSION: 1. NG tube, well-positioned with tip and side port within the stomach. 2. Nonobstructed bowel-gas pattern. Electronically Signed   By: Roanna Banning M.D.   On: 01/26/2024 11:44   DG Chest Portable 1 View Result Date: 01/26/2024 CLINICAL DATA:  post intubation EXAM: PORTABLE CHEST 1 VIEW COMPARISON:  Chest XR, 01/26/2024.  CT AP, concurrent. FINDINGS: Support lines: Intubation with ETT within the distal thoracic trachea, 3 cm from carina. Enteric decompression tube with tip extending outside field. Cardiomegaly.  Aortic arch atherosclerosis. Mediastinum is within normal limits given technique and patient rotation. The RIGHT lung is clear. Patchy retrocardiac opacity. No large pleural effusion or pneumothorax. No acute displaced fracture. IMPRESSION: 1. Intubation with ETT well-positioned at the distal thoracic trachea. Additional lines and tubes, as above. 2. Cardiomegaly and Aortic Atherosclerosis (  ICD10-I70.0). 3. Retrocardiac opacity is likely to represent atelectasis, however early pneumonia can appear similar. Electronically Signed   By: Roanna Banning M.D.   On: 01/26/2024 11:41   CT ABDOMEN PELVIS WO CONTRAST Result Date: 01/26/2024 CLINICAL DATA:  Sepsis EXAM: CT ABDOMEN AND PELVIS WITHOUT CONTRAST TECHNIQUE: Multidetector CT imaging of the abdomen and pelvis was performed following the standard protocol without IV contrast. RADIATION DOSE REDUCTION: This exam was performed according to the departmental dose-optimization program which includes automated exposure control, adjustment of the mA and/or kV according to patient size and/or use of iterative reconstruction technique. COMPARISON:  CT AP, 11/12/2012 and 08/10/2008. Chest XR and KUB, concurrent. FINDINGS: Lower chest: Trace LEFT pleural effusion with adjacent dependent patchy pulmonary consolidation. Hepatobiliary: Gross nodular contour liver. No focal abnormality. Cholecystectomy. No biliary dilatation. Pancreas: No pancreatic ductal dilatation or surrounding inflammatory changes. Spleen: Splenomegaly, measuring up to 17.0 cm Adrenals/Urinary Tract: Adrenal glands are unremarkable. Kidneys are normal, without renal calculi, focal lesion, or hydronephrosis. Bladder is decompressed by Foley catheter. Stomach/Bowel: Stomach is within normal limits. Enteric decompression tube, with tip within the gastric body. Appendix appears normal. No evidence of bowel wall thickening, distention, or inflammatory changes. Vascular/Lymphatic: Aortic atherosclerosis without  aneurysmal dilatation. No enlarged abdominal or pelvic lymph nodes. Reproductive: Hysterectomy.  No adnexal mass. Other: No abdominal wall hernia or abnormality. Trace mesenteric stranding. Small volume perihepatic, perisplenic, LEFT lower quadrant and deep pelvic ascites. Musculoskeletal: Mild body wall edema. No acute or significant osseous findings. IMPRESSION: Suboptimal evaluation, secondary to a lack of intravenous contrast. 1. No acute abdominopelvic process. 2. Cirrhosis with portal hypertension, as evidenced by splenomegaly. Small volume of abdominopelvic ascites. 3. Trace LEFT pleural effusion, with adjacent pulmonary consolidation. This may represent atelectasis, though early pneumonia can appear similar. Electronically Signed   By: Roanna Banning M.D.   On: 01/26/2024 11:20   CT HEAD WO CONTRAST ( ) Result Date: 01/26/2024 CLINICAL DATA:  Mental status change, unknown cause; Neck trauma, focal neuro deficit or paresthesia (Age 43-64y) EXAM: CT HEAD WITHOUT CONTRAST CT CERVICAL SPINE WITHOUT CONTRAST TECHNIQUE: Multidetector CT imaging of the head and cervical spine was performed following the standard protocol without intravenous contrast. Multiplanar CT image reconstructions of the cervical spine were also generated. RADIATION DOSE REDUCTION: This exam was performed according to the departmental dose-optimization program which includes automated exposure control, adjustment of the mA and/or kV according to patient size and/or use of iterative reconstruction technique. COMPARISON:  None Available. FINDINGS: CT HEAD FINDINGS Brain: No hemorrhage. No hydrocephalus. No extra-axial fluid collection. No mass effect. No mass lesion. 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 Vascular: No hyperdense vessel or unexpected calcification. Skull: Soft tissue swelling along the left frontal scalp. No evidence of an underlying calvarial fracture. Sinuses/Orbits: No middle ear or mastoid effusion.  Paranasal sinuses are clear. Orbits are unremarkable Other: None. CT CERVICAL SPINE FINDINGS Alignment: Normal. Skull base and vertebrae: No acute fracture. No primary bone lesion or focal pathologic process. Soft tissues and spinal canal: No prevertebral fluid or swelling. No visible canal hematoma. Disc levels:  CT evidence of high-grade spinal canal stenosis Upper chest: Negative. Other: Partially imaged endotracheal and enteric tubes in place. IMPRESSION: 1. No CT evidence of intracranial injury. 2. 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 If there is clinical concern for an acute infarct, consider further evaluation with MRI. 3. Soft tissue swelling along the left frontal scalp. No evidence of an underlying calvarial fracture. 4. No acute fracture or traumatic subluxation of  the cervical spine. Electronically Signed   By: Lorenza Cambridge M.D.   On: 01/26/2024 10:46   CT Cervical Spine Wo Contrast Result Date: 01/26/2024 CLINICAL DATA:  Mental status change, unknown cause; Neck trauma, focal neuro deficit or paresthesia (Age 8-64y) EXAM: CT HEAD WITHOUT CONTRAST CT CERVICAL SPINE WITHOUT CONTRAST TECHNIQUE: Multidetector CT imaging of the head and cervical spine was performed following the standard protocol without intravenous contrast. Multiplanar CT image reconstructions of the cervical spine were also generated. RADIATION DOSE REDUCTION: This exam was performed according to the departmental dose-optimization program which includes automated exposure control, adjustment of the mA and/or kV according to patient size and/or use of iterative reconstruction technique. COMPARISON:  None Available. FINDINGS: CT HEAD FINDINGS Brain: No hemorrhage. No hydrocephalus. No extra-axial fluid collection. No mass effect. No mass lesion. 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 Vascular: No hyperdense vessel or unexpected calcification. Skull: Soft tissue swelling along the left frontal scalp. No evidence of  an underlying calvarial fracture. Sinuses/Orbits: No middle ear or mastoid effusion. Paranasal sinuses are clear. Orbits are unremarkable Other: None. CT CERVICAL SPINE FINDINGS Alignment: Normal. Skull base and vertebrae: No acute fracture. No primary bone lesion or focal pathologic process. Soft tissues and spinal canal: No prevertebral fluid or swelling. No visible canal hematoma. Disc levels:  CT evidence of high-grade spinal canal stenosis Upper chest: Negative. Other: Partially imaged endotracheal and enteric tubes in place. IMPRESSION: 1. No CT evidence of intracranial injury. 2. 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 If there is clinical concern for an acute infarct, consider further evaluation with MRI. 3. Soft tissue swelling along the left frontal scalp. No evidence of an underlying calvarial fracture. 4. No acute fracture or traumatic subluxation of the cervical spine. Electronically Signed   By: Lorenza Cambridge M.D.   On: 01/26/2024 10:46   DG Chest Port 1 View Result Date: 01/26/2024 CLINICAL DATA:  Sepsis. EXAM: PORTABLE CHEST 1 VIEW COMPARISON:  Chest radiograph dated 07/20/2021. FINDINGS: Shallow inspiration with minimal bibasilar atelectasis. No focal consolidation, pleural effusion, or pneumothorax. Mild cardiomegaly with mild central vascular congestion. No acute osseous pathology. IMPRESSION: Mild cardiomegaly with mild central vascular congestion. No focal consolidation. Electronically Signed   By: Elgie Collard M.D.   On: 01/26/2024 10:14    Microbiology: Results for orders placed or performed during the hospital encounter of 01/26/24  Resp panel by RT-PCR (RSV, Flu A&B, Covid) Anterior Nasal Swab     Status: None   Collection Time: 01/26/24  8:34 AM   Specimen: Anterior Nasal Swab  Result Value Ref Range Status   SARS Coronavirus 2 by RT PCR NEGATIVE NEGATIVE Final    Comment: (NOTE) SARS-CoV-2 target nucleic acids are NOT DETECTED.  The SARS-CoV-2 RNA is generally  detectable in upper respiratory specimens during the acute phase of infection. The lowest concentration of SARS-CoV-2 viral copies this assay can detect is 138 copies/mL. A negative result does not preclude SARS-Cov-2 infection and should not be used as the sole basis for treatment or other patient management decisions. A negative result may occur with  improper specimen collection/handling, submission of specimen other than nasopharyngeal swab, presence of viral mutation(s) within the areas targeted by this assay, and inadequate number of viral copies(<138 copies/mL). A negative result must be combined with clinical observations, patient history, and epidemiological information. The expected result is Negative.  Fact Sheet for Patients:  BloggerCourse.com  Fact Sheet for Healthcare Providers:  SeriousBroker.it  This test is no t yet approved  or cleared by the [place] and  has been authorized for detection and/or diagnosis of SARS-CoV-2 by FDA under an Emergency Use Authorization (EUA). This EUA will remain  in effect (meaning this test can be used) for the duration of the COVID-19 declaration under Section 564(b)(1) of the Act, 21 U.S.C.section 360bbb-3(b)(1), unless the authorization is terminated  or revoked sooner.       Influenza A by PCR NEGATIVE NEGATIVE Final   Influenza B by PCR NEGATIVE NEGATIVE Final    Comment: (NOTE) The Xpert Xpress SARS-CoV-2/FLU/RSV plus assay is intended as an aid in the diagnosis of influenza from Nasopharyngeal swab specimens and should not be used as a sole basis for treatment. Nasal washings and aspirates are unacceptable for Xpert Xpress SARS-CoV-2/FLU/RSV testing.  Fact Sheet for Patients: BloggerCourse.com  Fact Sheet for Healthcare Providers: SeriousBroker.it  This test is not yet approved or cleared by the [place] FDA  and has been authorized for detection and/or diagnosis of SARS-CoV-2 by FDA under an Emergency Use Authorization (EUA). This EUA will remain in effect (meaning this test can be used) for the duration of the COVID-19 declaration under Section 564(b)(1) of the Act, 21 U.S.C. section 360bbb-3(b)(1), unless the authorization is terminated or revoked.     Resp Syncytial Virus by PCR NEGATIVE NEGATIVE Final    Comment: (NOTE) Fact Sheet for Patients: BloggerCourse.com  Fact Sheet for Healthcare Providers: SeriousBroker.it  This test is not yet approved or cleared by the [place] FDA and has been authorized for detection and/or diagnosis of SARS-CoV-2 by FDA under an Emergency Use Authorization (EUA). This EUA will remain in effect (meaning this test can be used) for the duration of the COVID-19 declaration under Section 564(b)(1) of the Act, 21 U.S.C. section 360bbb-3(b)(1), unless the authorization is terminated or revoked.  Performed at [facility], [address] [place], [place] [address]   Blood Culture (routine x 2)     Status: Abnormal   Collection Time: 01/26/24  8:34 AM   Specimen: BLOOD  Result Value Ref Range Status   Specimen Description   Final    BLOOD RIGHT AC Performed at [facility], [address] [place], [place] [address]    Special Requests   Final    BOTTLES DRAWN AEROBIC AND ANAEROBIC Blood Culture results may not be optimal due to an inadequate volume of blood received in culture bottles Performed at [facility], [address] [place], [place] [address]    Culture  Setup Time   Final    GRAM NEGATIVE RODS IN BOTH AEROBIC AND ANAEROBIC BOTTLES CRITICAL RESULT CALLED TO, READ BACK BY AND VERIFIED WITH: JASON ROBBINS PHARMD @0240  01/27/24 ASW    Culture KLEBSIELLA OXYTOCA (A)  Final   Report Status 01/29/2024 FINAL  Final   Organism ID, Bacteria KLEBSIELLA OXYTOCA   Final      Susceptibility   Klebsiella oxytoca - MIC*    AMPICILLIN >=32 RESISTANT Resistant     CEFEPIME <=0.12 SENSITIVE Sensitive     CEFTAZIDIME <=1 SENSITIVE Sensitive     CEFTRIAXONE <=0.25 SENSITIVE Sensitive     CIPROFLOXACIN <=0.25 SENSITIVE Sensitive     GENTAMICIN <=1 SENSITIVE Sensitive     IMIPENEM <=0.25 SENSITIVE Sensitive     TRIMETH/SULFA <=20 SENSITIVE Sensitive     AMPICILLIN/SULBACTAM 8 SENSITIVE Sensitive     PIP/TAZO <=4 SENSITIVE Sensitive ug/mL    * KLEBSIELLA OXYTOCA  Blood Culture (routine x 2)  Status: Abnormal   Collection Time: 01/26/24  8:34 AM   Specimen: BLOOD  Result Value Ref Range Status   Specimen Description   Final    BLOOD LEFT ANTECUBITAL Performed at [facility], [address] [place], [place] [address]    Special Requests   Final    BOTTLES DRAWN AEROBIC AND ANAEROBIC Blood Culture results may not be optimal due to an inadequate volume of blood received in culture bottles Performed at [facility], [address] [place], [place] [address]    Culture  Setup Time   Final    GRAM POSITIVE COCCI IN BOTH AEROBIC AND ANAEROBIC BOTTLES CRITICAL RESULT CALLED TO, READ BACK BY AND VERIFIED WITH: EMILY STEINBOCH 01/27/24 1439 KLW    Culture (A)  Final    STAPHYLOCOCCUS EPIDERMIDIS THE SIGNIFICANCE OF ISOLATING THIS ORGANISM FROM A SINGLE SET OF BLOOD CULTURES WHEN MULTIPLE SETS ARE DRAWN IS UNCERTAIN. PLEASE NOTIFY THE MICROBIOLOGY DEPARTMENT WITHIN ONE WEEK IF SPECIATION AND SENSITIVITIES ARE REQUIRED. Performed at [facility], [address] [place], [place] [address]    Report Status 01/30/2024 FINAL  Final  Urine Culture     Status: Abnormal   Collection Time: 01/26/24  8:34 AM   Specimen: Urine, Clean Catch  Result Value Ref Range Status   Specimen Description   Final    URINE, CLEAN CATCH Performed at [facility], [address] [place], [place] [address]    Special Requests   Final    NONE  Reflexed from [id number] Performed at [facility], [address] [place], [place] [address]    Culture >=100,000 COLONIES/mL KLEBSIELLA OXYTOCA (A)  Final   Report Status 01/28/2024 FINAL  Final   Organism ID, Bacteria KLEBSIELLA OXYTOCA (A)  Final      Susceptibility   Klebsiella oxytoca - MIC*    AMPICILLIN >=32 RESISTANT Resistant     CEFEPIME <=0.12 SENSITIVE Sensitive     CEFTRIAXONE <=0.25 SENSITIVE Sensitive     CIPROFLOXACIN <=0.25 SENSITIVE Sensitive     GENTAMICIN <=1 SENSITIVE Sensitive     IMIPENEM <=0.25 SENSITIVE Sensitive     NITROFURANTOIN <=16 SENSITIVE Sensitive     TRIMETH/SULFA <=20 SENSITIVE Sensitive     AMPICILLIN/SULBACTAM 8 SENSITIVE Sensitive     PIP/TAZO <=4 SENSITIVE Sensitive ug/mL    * >=100,000 COLONIES/mL KLEBSIELLA OXYTOCA  Blood Culture ID Panel (Reflexed)     Status: Abnormal   Collection Time: 01/26/24  8:34 AM  Result Value Ref Range Status   Enterococcus faecalis NOT DETECTED NOT DETECTED Final   Enterococcus Faecium NOT DETECTED NOT DETECTED Final   Listeria monocytogenes NOT DETECTED NOT DETECTED Final   Staphylococcus species NOT DETECTED NOT DETECTED Final   Staphylococcus aureus (BCID) NOT DETECTED NOT DETECTED Final   Staphylococcus epidermidis NOT DETECTED NOT DETECTED Final   Staphylococcus lugdunensis NOT DETECTED NOT DETECTED Final   Streptococcus species NOT DETECTED NOT DETECTED Final   Streptococcus agalactiae NOT DETECTED NOT DETECTED Final   Streptococcus pneumoniae NOT DETECTED NOT DETECTED Final   Streptococcus pyogenes NOT DETECTED NOT DETECTED Final   A.calcoaceticus-baumannii NOT DETECTED NOT DETECTED Final   Bacteroides fragilis NOT DETECTED NOT DETECTED Final   Enterobacterales DETECTED (A) NOT DETECTED Final    Comment: Enterobacterales represent a large order of gram negative bacteria, not a single organism. CRITICAL RESULT CALLED TO, READ BACK BY AND VERIFIED WITH: JASON ROBBINS PHARMD @0240  01/27/24 ASW     Enterobacter cloacae complex NOT  DETECTED NOT DETECTED Final   Escherichia coli NOT DETECTED NOT DETECTED Final   Klebsiella aerogenes NOT DETECTED NOT DETECTED Final   Klebsiella oxytoca DETECTED (A) NOT DETECTED Final    Comment: CRITICAL RESULT CALLED TO, READ BACK BY AND VERIFIED WITH: JASON ROBBINS PHARMD @0240  01/27/24 ASW    Klebsiella pneumoniae NOT DETECTED NOT DETECTED Final   Proteus species NOT DETECTED NOT DETECTED Final   Salmonella species NOT DETECTED NOT DETECTED Final   Serratia marcescens NOT DETECTED NOT DETECTED Final   Haemophilus influenzae NOT DETECTED NOT DETECTED Final   Neisseria meningitidis NOT DETECTED NOT DETECTED Final   Pseudomonas aeruginosa NOT DETECTED NOT DETECTED Final   Stenotrophomonas maltophilia NOT DETECTED NOT DETECTED Final   Candida albicans NOT DETECTED NOT DETECTED Final   Candida auris NOT DETECTED NOT DETECTED Final   Candida glabrata NOT DETECTED NOT DETECTED Final   Candida krusei NOT DETECTED NOT DETECTED Final   Candida parapsilosis NOT DETECTED NOT DETECTED Final   Candida tropicalis NOT DETECTED NOT DETECTED Final   Cryptococcus neoformans/gattii NOT DETECTED NOT DETECTED Final   CTX-M ESBL NOT DETECTED NOT DETECTED Final   Carbapenem resistance IMP NOT DETECTED NOT DETECTED Final   Carbapenem resistance KPC NOT DETECTED NOT DETECTED Final   Carbapenem resistance NDM NOT DETECTED NOT DETECTED Final   Carbapenem resist OXA 48 LIKE NOT DETECTED NOT DETECTED Final   Carbapenem resistance VIM NOT DETECTED NOT DETECTED Final    Comment: Performed at [facility], [address] [place], [place] [address]  Blood Culture ID Panel (Reflexed)     Status: Abnormal   Collection Time: 01/26/24  8:34 AM  Result Value Ref Range Status   Enterococcus faecalis NOT DETECTED NOT DETECTED Final   Enterococcus Faecium NOT DETECTED NOT DETECTED Final   Listeria monocytogenes NOT DETECTED NOT DETECTED Final   Staphylococcus species  DETECTED (A) NOT DETECTED Final    Comment: CRITICAL RESULT CALLED TO, READ BACK BY AND VERIFIED WITH: EMILY STEINBOCH 01/27/24 1439 KLW    Staphylococcus aureus (BCID) NOT DETECTED NOT DETECTED Final   Staphylococcus epidermidis DETECTED (A) NOT DETECTED Final    Comment: Methicillin (oxacillin) resistant coagulase negative staphylococcus. Possible blood culture contaminant (unless isolated from more than one blood culture draw or clinical case suggests pathogenicity). No antibiotic treatment is indicated for blood  culture contaminants. CRITICAL RESULT CALLED TO, READ BACK BY AND VERIFIED WITH: EMILY STEINBOCH 01/27/24 1439 KLW    Staphylococcus lugdunensis NOT DETECTED NOT DETECTED Final   Streptococcus species NOT DETECTED NOT DETECTED Final   Streptococcus agalactiae NOT DETECTED NOT DETECTED Final   Streptococcus pneumoniae NOT DETECTED NOT DETECTED Final   Streptococcus pyogenes NOT DETECTED NOT DETECTED Final   A.calcoaceticus-baumannii NOT DETECTED NOT DETECTED Final   Bacteroides fragilis NOT DETECTED NOT DETECTED Final   Enterobacterales NOT DETECTED NOT DETECTED Final   Enterobacter cloacae complex NOT DETECTED NOT DETECTED Final   Escherichia coli NOT DETECTED NOT DETECTED Final   Klebsiella aerogenes NOT DETECTED NOT DETECTED Final   Klebsiella oxytoca NOT DETECTED NOT DETECTED Final   Klebsiella pneumoniae NOT DETECTED NOT DETECTED Final   Proteus species NOT DETECTED NOT DETECTED Final   Salmonella species NOT DETECTED NOT DETECTED Final   Serratia marcescens NOT DETECTED NOT DETECTED Final   Haemophilus influenzae NOT DETECTED NOT DETECTED Final   Neisseria meningitidis NOT DETECTED NOT DETECTED Final   Pseudomonas aeruginosa NOT DETECTED NOT DETECTED Final   Stenotrophomonas maltophilia NOT DETECTED NOT DETECTED Final  Candida albicans NOT DETECTED NOT DETECTED Final   Candida auris NOT DETECTED NOT DETECTED Final   Candida glabrata NOT DETECTED NOT DETECTED Final    Candida krusei NOT DETECTED NOT DETECTED Final   Candida parapsilosis NOT DETECTED NOT DETECTED Final   Candida tropicalis NOT DETECTED NOT DETECTED Final   Cryptococcus neoformans/gattii NOT DETECTED NOT DETECTED Final   Methicillin resistance mecA/C DETECTED (A) NOT DETECTED Final    Comment: CRITICAL RESULT CALLED TO, READ BACK BY AND VERIFIED WITH: EMILY STEINBOCH 01/27/24 1439 KLW Performed at [facility], [address] [place], [place] [address]   MRSA Next Gen by PCR, Nasal     Status: Abnormal   Collection Time: 01/26/24 11:42 AM   Specimen: Nasal Mucosa; Nasal Swab  Result Value Ref Range Status   MRSA by PCR Next Gen DETECTED (A) NOT DETECTED Final    Comment: RESULT CALLED TO, READ BACK BY AND VERIFIED WITH: Claudette Stapler 01/26/24 1315 MW (NOTE) The GeneXpert MRSA Assay (FDA approved for NASAL specimens only), is one component of a comprehensive MRSA colonization surveillance program. It is not intended to diagnose MRSA infection nor to guide or monitor treatment for MRSA infections. Test performance is not FDA approved in patients less than 43 years old. Performed at [facility], [address] [place], [place] [address]   Culture, Respiratory w Gram Stain     Status: None   Collection Time: 01/26/24  2:06 PM   Specimen: Tracheal Aspirate; Respiratory  Result Value Ref Range Status   Specimen Description TRACHEAL ASPIRATE  Final   Special Requests NONE  Final   Gram Stain   Final    RARE WBC PRESENT, PREDOMINANTLY PMN NO ORGANISMS SEEN    Culture   Final    RARE Normal respiratory flora-no Staph aureus or Pseudomonas seen Performed at [facility], [address], [place], [place] [address]    Report Status 01/29/2024 FINAL  Final  Respiratory (~20 pathogens) panel by PCR     Status: None   Collection Time: 01/26/24  7:00 PM   Specimen: Nasopharyngeal Swab; Respiratory  Result Value Ref Range Status   Adenovirus NOT DETECTED NOT  DETECTED Final   Coronavirus 229E NOT DETECTED NOT DETECTED Final    Comment: (NOTE) The Coronavirus on the Respiratory Panel, DOES NOT test for the novel  Coronavirus (2019 nCoV)    Coronavirus HKU1 NOT DETECTED NOT DETECTED Final   Coronavirus NL63 NOT DETECTED NOT DETECTED Final   Coronavirus OC43 NOT DETECTED NOT DETECTED Final   Metapneumovirus NOT DETECTED NOT DETECTED Final   Rhinovirus / Enterovirus NOT DETECTED NOT DETECTED Final   Influenza A NOT DETECTED NOT DETECTED Final   Influenza B NOT DETECTED NOT DETECTED Final   Parainfluenza Virus 1 NOT DETECTED NOT DETECTED Final   Parainfluenza Virus 2 NOT DETECTED NOT DETECTED Final   Parainfluenza Virus 3 NOT DETECTED NOT DETECTED Final   Parainfluenza Virus 4 NOT DETECTED NOT DETECTED Final   Respiratory Syncytial Virus NOT DETECTED NOT DETECTED Final   Bordetella pertussis NOT DETECTED NOT DETECTED Final   Bordetella Parapertussis NOT DETECTED NOT DETECTED Final   Chlamydophila pneumoniae NOT DETECTED NOT DETECTED Final   Mycoplasma pneumoniae NOT DETECTED NOT DETECTED Final    Comment: Performed at [facility], [address], [place], [place] [address]    Labs: CBC: Recent Labs  Lab 02/02/24 0550 02/03/24 0510 02/04/24 0520 02/08/24 0508  WBC 8.0 9.2 6.8 5.6  HGB 7.5* 8.5* 8.8* 8.4*  HCT 23.8* 27.3* 27.8* 26.9*  MCV 82.9 83.0 81.8 82.3  PLT 135* 147* 160 126*   Basic Metabolic Panel: Recent Labs  Lab 02/02/24 0550 02/03/24 0510 02/04/24 0520 02/05/24 0453 02/06/24 0456 02/07/24 0644 02/08/24 0508  NA 134* 133*  --  132*  --  132* 136  K 3.8 3.8  --  3.3*  --  3.2* 3.9  CL 104 102  --  98  --  102 103  CO2 24 22  --  23  --  24 22  GLUCOSE 145* 106*  --  109*  --  92 93  BUN 30* 26*  --  26*  --  22* 21*  CREATININE 0.84 0.95  --  1.27*  --  1.26* 1.34*  CALCIUM 9.2 9.3  --  9.3  --  8.9 8.9  MG  --   --  1.8 1.7 2.0 1.8  --   PHOS  --   --  2.1* 2.4* 2.1* 2.2* 2.7   Liver Function  Tests: No results for input(s): "AST", "ALT", "ALKPHOS", "BILITOT", "PROT", "ALBUMIN" in the last 168 hours. CBG: Recent Labs  Lab 02/07/24 2313 02/08/24 0326 02/08/24 0346 02/08/24 0647 02/08/24 0828  GLUCAP 196* 63* 71 82 93    Discharge time spent: greater than 30 minutes.  Signed: Marrion Coy, MD [facility] 02/08/2024

## 2024-02-08 NOTE — Progress Notes (Signed)
 Hypoglycemic Event  CBG: 63  Treatment: 4 oz juice/soda  Symptoms: None  Follow-up CBG: Time:0346  CBG Result:71  Possible Reasons for Event: Medication regimen: Increased insulin administration frequency  Comments/MD notified:J Mansy, on-call for attending paged regarding above @0351 . Page promptly returned and order received to recheck CBG @0600  and to hold long acting insulin for 24 hours. Will implement and continue to monitor.    Abigail Molina

## 2024-02-08 NOTE — TOC Progression Note (Signed)
 Transition of Care [facility]) - Progression Note    Patient Details  Name: Abigail Molina MRN: [id number] Date of Birth: [birth year]  [facility]) CM/SW Contact  Chapman Fitch, RN Phone Number: 02/08/2024, 9:26 AM  Clinical Narrative:    Message sent to Dabe at [facility] for update on insurance auth        Expected Discharge Plan and Services                                               Social Determinants of Health (SDOH) Interventions SDOH Screenings   Food Insecurity: Patient Unable To Answer (01/26/2024)  Housing: Patient Unable To Answer (01/26/2024)  Transportation Needs: Patient Unable To Answer (01/26/2024)  Utilities: Patient Unable To Answer (01/26/2024)  Depression (PHQ2-9): High Risk (11/02/2023)  Financial Resource Strain: Low Risk  (12/16/2023)   Received from [facility]  Physical Activity: Insufficiently Active (01/09/2021)   Received [facility], [facility]  Social Connections: Moderately Isolated (09/02/2018)  Stress: Stress Concern Present (09/17/2022)   Received from [facility], [facility]  Tobacco Use: Low Risk  (01/26/2024)  Recent Concern: Tobacco Use - Medium Risk (01/04/2024)   Received from [facility] Literacy: Low Risk  (01/09/2021)   Received from [facility], [facility]    Readmission Risk Interventions     No data to display

## 2024-02-08 NOTE — TOC Transition Note (Signed)
 Transition of Care [facility]) - Discharge Note   Patient Details  Name: Abigail Molina MRN: [id number] Date of Birth: 1966-01-28  Transition of Care [facility]) CM/SW Contact:  Chapman Fitch, RN Phone Number: 02/08/2024, 10:43 AM   Clinical Narrative:     Notified that patient wants to return home with home health instead of moving forward with SNF  Met with patient at bedside She confirms Dabe at [facility] [employer] notified  Patient agreeable to home health services.  States she does not have a preference of home health agency.  Referral made and accepted by [place] with [facility] Patient confirms she has a RW and BSC at home.  Denies the need for any additional DME.  She states she is going to call her cousin to see if she can pay for a cab at discharge         Patient Goals and CMS Choice            Discharge Placement                       Discharge Plan and Services Additional resources added to the After Visit Summary for                                       Social Drivers of Health (SDOH) Interventions SDOH Screenings   Food Insecurity: Patient Unable To Answer (01/26/2024)  Housing: Patient Unable To Answer (01/26/2024)  Transportation Needs: Patient Unable To Answer (01/26/2024)  Utilities: Patient Unable To Answer (01/26/2024)  Depression (PHQ2-9): High Risk (11/02/2023)  Financial Resource Strain: Low Risk  (12/16/2023)   [facility]  Physical Activity: Insufficiently Active (01/09/2021)   Received from [facility], [facility]  Social Connections: Moderately Isolated (09/02/2018)  Stress: Stress Concern Present (09/17/2022)   Received from [facility], [facility]  Tobacco Use: Low Risk  (01/26/2024)  Recent Concern: Tobacco Use - Medium Risk (01/04/2024)   Received from [facility] Literacy: Low Risk  (01/09/2021)   Received from [facility], [facility]     Readmission Risk Interventions      No data to display

## 2024-02-08 NOTE — TOC Transition Note (Signed)
 Transition of Care [facility]) - Discharge Note   Patient Details  Name: Abigail Molina MRN: [id number] Date of Birth: 1966/01/29  Transition of Care [facility]) CM/SW Contact:  Chapman Fitch, RN Phone Number: 02/08/2024, 11:52 AM   Clinical Narrative:          DC transport set up through her Medicaid [phone]   Patient Goals and CMS Choice            Discharge Placement                       Discharge Plan and Services Additional resources added to the After Visit Summary for                                       Social Drivers of Health (SDOH) Interventions SDOH Screenings   Food Insecurity: Patient Unable To Answer (01/26/2024)  Housing: Patient Unable To Answer (01/26/2024)  Transportation Needs: Patient Unable To Answer (01/26/2024)  Utilities: Patient Unable To Answer (01/26/2024)  Depression (PHQ2-9): High Risk (11/02/2023)  Financial Resource Strain: Low Risk  (12/16/2023)   Received from [facility]  Physical Activity: Insufficiently Active (01/09/2021)   Received from [facility], [facility]  Social Connections: Moderately Isolated (09/02/2018)  Stress: Stress Concern Present (09/17/2022)   Received from [facility], [facility]  Tobacco Use: Low Risk  (01/26/2024)  Recent Concern: Tobacco Use - Medium Risk (01/04/2024)   Received from [facility] Literacy: Low Risk  (01/09/2021)   Received from [facility], [facility]     Readmission Risk Interventions     No data to display

## 2024-02-08 NOTE — Progress Notes (Signed)
 Discharge instructions were reviewed with patient. Home medications returned to patient from pharmacy. Questions were encouraged and answered. Wound care supplies were provided. Belongings collected by patient.

## 2024-02-12 ENCOUNTER — Ambulatory Visit: Admit: 2024-02-12 | Payer: PRIVATE HEALTH INSURANCE | Attending: Family | Primary: Family

## 2024-02-19 ENCOUNTER — Other Ambulatory Visit: Payer: Self-pay | Admitting: Psychiatry

## 2024-02-19 DIAGNOSIS — Z794 Long term (current) use of insulin: Principal | ICD-10-CM

## 2024-02-19 DIAGNOSIS — E1169 Type 2 diabetes mellitus with other specified complication: Principal | ICD-10-CM

## 2024-02-19 MED ORDER — LANCETS
1 refills | 0.00 days
Start: 2024-02-19 — End: 2025-02-19

## 2024-02-19 MED ORDER — ASPIRIN 81 MG TABLET,DELAYED RELEASE
ORAL_TABLET | Freq: Every evening | ORAL | 0 refills | 30.00 days | Status: CP
Start: 2024-02-19 — End: ?

## 2024-02-19 MED ORDER — METFORMIN ER 500 MG TABLET,EXTENDED RELEASE 24 HR
ORAL_TABLET | 0 refills | 0.00 days | Status: CP
Start: 2024-02-19 — End: ?

## 2024-02-19 NOTE — Unmapped (Signed)
 Gave verbal order to [place] from [facility] for occupational therapy Per Dr Rosine Beat.    Nigel Berthold, CMA

## 2024-02-19 NOTE — Unmapped (Signed)
 Patient is requesting the following refill  Requested Prescriptions     Pending Prescriptions Disp Refills    aspirin (ECOTRIN) 81 MG tablet 30 tablet 0     Sig: Take 1 tablet (81 mg total) by mouth nightly.       Recent Visits  Date Type Provider Dept   12/07/23 Office Visit Mangel, Benison Pap, DO [place] [facility] [address] [facility]   10/26/23 Office Visit Mangel, Benison Pap, DO [place] [facility] [address] At [facility]   08/31/23 Office Visit Tessie Eke, MD [place] [facility] [address] At [facility]   05/08/23 Office Visit Mangel, Benison Pap, DO [place] [facility] [address] At [facility]   02/27/23 Office Visit Mangel, Benison Pap, DO  Primary Care [address] At [facility]   Showing recent visits within past 365 days and meeting all other requirements  Future Appointments  No visits were found meeting these conditions.  Showing future appointments within next 365 days and meeting all other requirements       Labs: Not applicable this refill

## 2024-02-19 NOTE — Unmapped (Signed)
 Yes, I give verbal orders for occupational therapy

## 2024-02-19 NOTE — Unmapped (Signed)
 Patient is requesting the following refill  Requested Prescriptions     Pending Prescriptions Disp Refills    metFORMIN (GLUCOPHAGE-XR) 500 MG 24 hr tablet [Pharmacy Med Name: metFORMIN HCl ER 500 MG Oral Tablet Extended Release 24 Hour] 360 tablet 0     Sig: TAKE 2 TABLETS BY MOUTH IN THE MORNING AND 2 TABLETS IN THE EVENING WITH MEALS       Recent Visits  Date Type Provider Dept   12/07/23 Office Visit Mangel, Benison Pap, DO [place] [facility] [address] At [facility]   11/06/23 Office Visit Pou, Inetta Fermo, MD [facility]   10/26/23 Office Visit Mangel, Benison Pap, DO [place] [facility] [address] At [facility]   09/04/23 Office Visit Pou, Inetta Fermo, MD [place] End[facility]inology [facility]   08/31/23 Office Visit Tessie Eke, MD [place] [facility] [address] At [facility]   08/11/23 Telemedicine Pou, Inetta Fermo, MD [facility] Endocrin[facility]   05/08/23 Office Visit Mangel, Benison Pap, DO [place] [facility] [address] At [facility]   02/27/23 Office Visit Mangel, Benison Pap, DO [place] [facility] [address] At [facility]   Showing recent visits within past 365 days and meeting all other requirements  Future Appointments  No visits were found meeting these conditions.  Showing future appointments within next 365 days and meeting all other requirements       Labs: A1c:   Hemoglobin A1C (%)   Date Value   11/24/2023 7.9 (H)   11/04/2022 7.8 (A)

## 2024-02-19 NOTE — Unmapped (Signed)
 Ashley Briggs from [facility] called requesting verbal orders from the provider for Occupational therapy to see the patient once a week for 8 weeks. Callback number [phone]. Please advise.

## 2024-02-19 NOTE — Telephone Encounter (Signed)
 Please contact the patient to make a 30 min, in person visit - I have not seen her since nov. Thanks.

## 2024-02-20 MED ORDER — SEMGLEE (INSULIN GLARGINE-YFGN) PEN 100 UNIT/ML (3 ML) SUBCUTANEOUS
0 refills | 0 days
Start: 2024-02-20 — End: ?

## 2024-02-22 ENCOUNTER — Telehealth: Payer: Self-pay | Admitting: Psychiatry

## 2024-02-22 MED ORDER — LANCETS
3 refills | 0 days | Status: CP
Start: 2024-02-22 — End: 2025-02-19

## 2024-02-22 MED ORDER — SEMGLEE (INSULIN GLARGINE-YFGN) PEN 100 UNIT/ML (3 ML) SUBCUTANEOUS
0 refills | 0 days
Start: 2024-02-22 — End: ?

## 2024-02-22 NOTE — Unmapped (Signed)
 Medication was discontinued 11/2023

## 2024-02-24 DIAGNOSIS — R601 Generalized edema: Principal | ICD-10-CM

## 2024-02-24 DIAGNOSIS — K7682 Hepatic encephalopathy: Principal | ICD-10-CM

## 2024-02-24 DIAGNOSIS — E1169 Type 2 diabetes mellitus with other specified complication: Principal | ICD-10-CM

## 2024-02-24 DIAGNOSIS — R188 Other ascites: Principal | ICD-10-CM

## 2024-02-24 DIAGNOSIS — Z794 Long term (current) use of insulin: Principal | ICD-10-CM

## 2024-02-24 DIAGNOSIS — E785 Hyperlipidemia, unspecified: Principal | ICD-10-CM

## 2024-02-24 MED ORDER — RIFAXIMIN 550 MG TABLET
ORAL_TABLET | Freq: Two times a day (BID) | ORAL | 11 refills | 30 days
Start: 2024-02-24 — End: ?

## 2024-02-24 MED ORDER — SIMVASTATIN 20 MG TABLET
ORAL_TABLET | Freq: Every evening | ORAL | 1 refills | 90 days | Status: CP
Start: 2024-02-24 — End: 2024-08-22
  Filled 2024-05-05: qty 90, 90d supply, fill #0

## 2024-02-24 MED ORDER — MAGNESIUM OXIDE 400 MG (241.3 MG MAGNESIUM) TABLET
ORAL_TABLET | Freq: Two times a day (BID) | ORAL | 11 refills | 30 days
Start: 2024-02-24 — End: 2025-02-23

## 2024-02-24 MED ORDER — NALTREXONE 50 MG TABLET
ORAL_TABLET | Freq: Every evening | ORAL | 1 refills | 90 days | Status: CP
Start: 2024-02-24 — End: 2024-08-22
  Filled 2024-04-29: qty 30, 30d supply, fill #0

## 2024-02-24 MED ORDER — METFORMIN ER 500 MG TABLET,EXTENDED RELEASE 24 HR
ORAL_TABLET | 0 refills | 0 days
Start: 2024-02-24 — End: ?

## 2024-02-24 MED ORDER — FUROSEMIDE 40 MG TABLET
ORAL_TABLET | Freq: Every day | ORAL | 0 refills | 30 days | Status: CP
Start: 2024-02-24 — End: 2024-03-25

## 2024-02-24 NOTE — Unmapped (Signed)
 She should have refills on magnesium o

## 2024-02-24 NOTE — Unmapped (Signed)
 Patient is requesting the following refill  Requested Prescriptions     Pending Prescriptions Disp Refills    metFORMIN (GLUCOPHAGE-XR) 500 MG 24 hr tablet 360 tablet 0       Recent Visits  Date Type Provider Dept   12/07/23 Office Visit Mangel, Benison Pap, DO [place] [facility] [address] At [facility]   11/06/23 Office Visit Pou, Inetta Fermo, MD [facility]   10/26/23 Office Visit Mangel, Benison Pap, DO [place] [facility] [address] At [facility]   09/04/23 Office Visit Pou, Inetta Fermo, MD [place] Endocrinology [facility]   08/31/23 Office Visit Tessie Eke, MD [place] [facility] [address] At [facility]   08/11/23 Telemedicine Pou, Inetta Fermo, MD [facility]   05/08/23 Office Visit Mangel, Benison Pap, DO [place] [facility] [address] At [facility]   02/27/23 Office Visit Mangel, Benison Pap, DO [place] [facility] [address] At [facility]   Showing recent visits within past 365 days and meeting all other requirements  Future Appointments  No visits were found meeting these conditions.  Showing future appointments within next 365 days and meeting all other requirements       Labs: A1c:   Hemoglobin A1C (%)   Date Value   11/24/2023 7.9 (H)   11/04/2022 7.8 (A)

## 2024-02-24 NOTE — Unmapped (Signed)
 Patient is requesting the following refill  Requested Prescriptions     Pending Prescriptions Disp Refills    magnesium oxide (MAG-OX) 400 mg (241.3 mg elemental magnesium) tablet 120 tablet 11     Sig: Take 2 tablets (800 mg total) by mouth two (2) times a day.       Recent Visits  Date Type Provider Dept   12/07/23 Office Visit Mangel, Benison Pap, DO [place] [facility] [address] At [facility]   10/26/23 Office Visit Mangel, Benison Pap, DO [place] [facility] [address] At [facility]   08/31/23 Office Visit Tessie Eke, MD [place] [facility] [address] At [facility]   05/08/23 Office Visit Mangel, Benison Pap, DO [place] [facility] [address] At [facility]   02/27/23 Office Visit Mangel, Benison Pap, DO  Primary Care [address] At [facility]   Showing recent visits within past 365 days and meeting all other requirements  Future Appointments  No visits were found meeting these conditions.  Showing future appointments within next 365 days and meeting all other requirements       Labs: A1c:   Hemoglobin A1C (%)   Date Value   11/24/2023 7.9 (H)   11/04/2022 7.8 (A)

## 2024-02-24 NOTE — Unmapped (Signed)
 Patient is requesting the following refill  Requested Prescriptions     Pending Prescriptions Disp Refills    naltrexone (DEPADE) 50 mg tablet  0     Sig: Take 1 tablet (50 mg total) by mouth nightly.    simvastatin (ZOCOR) 20 MG tablet 90 tablet 0     Sig: Take 1 tablet (20 mg total) by mouth every evening.       Recent Visits  Date Type Provider Dept   12/07/23 Office Visit Mangel, Benison Pap, DO [place] [facility] [address] [facility]   10/26/23 Office Visit Mangel, Benison Pap, DO [place] [facility] [address] At [facility]   08/31/23 Office Visit Tessie Eke, MD [place] [facility] [address] At [facility]   05/08/23 Office Visit Mangel, Benison Pap, DO [place] [facility] [address] At [facility]   02/27/23 Office Visit Mangel, Benison Pap, DO [place] [facility] [address] At [facility]   Showing recent visits within past 365 days and meeting all other requirements  Future Appointments  No visits were found meeting these conditions.  Showing future appointments within next 365 days and meeting all other requirements       Labs: Cholesterol:   Cholesterol (mg/dL)   Date Value   16/09/9603 122   ,   Triglycerides (mg/dL)   Date Value   54/08/8118 106   ,   HDL (mg/dL)   Date Value   14/78/2956 39 (L)   ,   LDL Calculated (mg/dL)   Date Value   21/30/8657 62

## 2024-02-24 NOTE — Unmapped (Signed)
 Patient is requesting the following refill  Requested Prescriptions     Pending Prescriptions Disp Refills    furosemide (LASIX) 40 MG tablet 30 tablet 0     Sig: Take 1 tablet (40 mg total) by mouth daily.       Recent Visits  Date Type Provider Dept   12/07/23 Office Visit Mangel, Benison Pap, DO [place] [facility] [address] At [facility]   10/26/23 Office Visit Mangel, Benison Pap, DO [place] [facility] [address] At [facility]   08/31/23 Office Visit Tessie Eke, MD [place] [facility] [address] At [facility]   05/08/23 Office Visit Mangel, Benison Pap, DO [place] [facility] [address] At [facility]   02/27/23 Office Visit Mangel, Benison Pap, DO [place] [facility] [address] At [facility]   Showing recent visits within past 365 days and meeting all other requirements  Future Appointments  No visits were found meeting these conditions.  Showing future appointments within next 365 days and meeting all other requirements       Labs: Not applicable this refill

## 2024-02-25 MED ORDER — RIFAXIMIN 550 MG TABLET
ORAL_TABLET | Freq: Two times a day (BID) | ORAL | 11 refills | 30 days
Start: 2024-02-25 — End: ?

## 2024-02-26 DIAGNOSIS — L409 Psoriasis, unspecified: Principal | ICD-10-CM

## 2024-02-26 MED FILL — ACCU-CHEK SOFTCLIX LANCETS: 90 days supply | Qty: 100 | Fill #0

## 2024-02-26 MED FILL — FUROSEMIDE 40 MG TABLET: ORAL | 30 days supply | Qty: 30 | Fill #0

## 2024-02-26 MED FILL — ASPIRIN 81 MG TABLET,DELAYED RELEASE: ORAL | 30 days supply | Qty: 30 | Fill #0

## 2024-02-26 MED FILL — FREESTYLE LIBRE 3 SENSOR DEVICE: 28 days supply | Qty: 2 | Fill #1

## 2024-02-29 NOTE — Unmapped (Signed)
 Nurse initiated PA via CMM; pending approval/denial.    STATUS:     Ashley Briggs (Key: [id number])    The [employer] is unable to complete your request at this time. Please see more information at the bottom of the page.    Information regarding your request  We received a prior authorization request for the member and product listed above. The [facility] Prior Authorization Team is not able to review this request because the requested medication has been previously approved under [id number]. Based on the information reviewed, the requested prescription is currently authorized for coverage by the plan until 2025-02-25. Please resubmit this request within 30 days of authorization expiration date.     Encounter routed to provider

## 2024-03-01 NOTE — Unmapped (Signed)
 Per Viviano Simas, if patient is unable to come in to the office due to weakness she needs to go to the ED today, preferably by EMS.     Called patient. LVM, HIPAA compliant and sent MyChart message.

## 2024-03-03 NOTE — Unmapped (Signed)
 Received fax stating PA for Cosentyx  has been approved and is good until 02/25/2025. Approval letter to be scanned into patients chart. Pt was informed via MyChart.

## 2024-03-05 MED ORDER — LEVETIRACETAM 500 MG TABLET
ORAL_TABLET | Freq: Two times a day (BID) | ORAL | 1 refills | 90.00 days
Start: 2024-03-05 — End: 2024-09-01

## 2024-03-07 MED ORDER — LEVETIRACETAM 500 MG TABLET
ORAL_TABLET | Freq: Two times a day (BID) | ORAL | 1 refills | 90.00 days
Start: 2024-03-07 — End: 2024-09-03

## 2024-03-07 NOTE — Unmapped (Signed)
 Request received via interface.     Provider: Dr. Sarita Bottom    Refill request received from patient.      Medication Requested: Keppra 500mg   Last Office Visit: 01/04/2024   Next Office Visit: Visit date not found  Last Prescriber: Dr. Kirtland Bouchard    Nurse refill requirements met? No  If not met, why: 6 month supply sent 01/04/2024 to [facility]    Sent to: Pharmacy per protocol  If sent to provider, which provider?:

## 2024-03-09 DIAGNOSIS — R569 Unspecified convulsions: Principal | ICD-10-CM

## 2024-03-17 ENCOUNTER — Emergency Department

## 2024-03-17 ENCOUNTER — Other Ambulatory Visit: Payer: Self-pay

## 2024-03-17 ENCOUNTER — Encounter: Payer: Self-pay | Admitting: Emergency Medicine

## 2024-03-17 ENCOUNTER — Inpatient Hospital Stay
Admission: EM | Admit: 2024-03-17 | Discharge: 2024-03-23 | DRG: 432 | Disposition: A | Discharge status: Home / Self Care | Attending: Internal Medicine | Admitting: Internal Medicine

## 2024-03-17 ENCOUNTER — Inpatient Hospital Stay

## 2024-03-17 DIAGNOSIS — Z8639 Personal history of other endocrine, nutritional and metabolic disease: Secondary | ICD-10-CM | POA: Diagnosis not present

## 2024-03-17 DIAGNOSIS — Z794 Long term (current) use of insulin: Secondary | ICD-10-CM | POA: Diagnosis not present

## 2024-03-17 DIAGNOSIS — E66812 Obesity, class 2: Secondary | ICD-10-CM | POA: Diagnosis not present

## 2024-03-17 DIAGNOSIS — E8770 Fluid overload, unspecified: Secondary | ICD-10-CM | POA: Diagnosis present

## 2024-03-17 DIAGNOSIS — E785 Hyperlipidemia, unspecified: Secondary | ICD-10-CM | POA: Diagnosis present

## 2024-03-17 DIAGNOSIS — Z823 Family history of stroke: Secondary | ICD-10-CM

## 2024-03-17 DIAGNOSIS — K3189 Other diseases of stomach and duodenum: Secondary | ICD-10-CM | POA: Diagnosis not present

## 2024-03-17 DIAGNOSIS — J90 Pleural effusion, not elsewhere classified: Secondary | ICD-10-CM | POA: Diagnosis present

## 2024-03-17 DIAGNOSIS — R188 Other ascites: Secondary | ICD-10-CM | POA: Diagnosis present

## 2024-03-17 DIAGNOSIS — N179 Acute kidney failure, unspecified: Secondary | ICD-10-CM | POA: Diagnosis not present

## 2024-03-17 DIAGNOSIS — D689 Coagulation defect, unspecified: Secondary | ICD-10-CM | POA: Diagnosis present

## 2024-03-17 DIAGNOSIS — D638 Anemia in other chronic diseases classified elsewhere: Secondary | ICD-10-CM | POA: Diagnosis present

## 2024-03-17 DIAGNOSIS — N3281 Overactive bladder: Secondary | ICD-10-CM | POA: Diagnosis present

## 2024-03-17 DIAGNOSIS — D509 Iron deficiency anemia, unspecified: Secondary | ICD-10-CM | POA: Diagnosis present

## 2024-03-17 DIAGNOSIS — E8809 Other disorders of plasma-protein metabolism, not elsewhere classified: Secondary | ICD-10-CM | POA: Diagnosis present

## 2024-03-17 DIAGNOSIS — E119 Type 2 diabetes mellitus without complications: Secondary | ICD-10-CM

## 2024-03-17 DIAGNOSIS — I9589 Other hypotension: Secondary | ICD-10-CM | POA: Diagnosis not present

## 2024-03-17 DIAGNOSIS — F419 Anxiety disorder, unspecified: Secondary | ICD-10-CM | POA: Diagnosis present

## 2024-03-17 DIAGNOSIS — Z6837 Body mass index (BMI) 37.0-37.9, adult: Secondary | ICD-10-CM | POA: Diagnosis not present

## 2024-03-17 DIAGNOSIS — N170 Acute kidney failure with tubular necrosis: Secondary | ICD-10-CM | POA: Diagnosis present

## 2024-03-17 DIAGNOSIS — I8511 Secondary esophageal varices with bleeding: Secondary | ICD-10-CM

## 2024-03-17 DIAGNOSIS — G4733 Obstructive sleep apnea (adult) (pediatric): Secondary | ICD-10-CM | POA: Diagnosis present

## 2024-03-17 DIAGNOSIS — E1165 Type 2 diabetes mellitus with hyperglycemia: Secondary | ICD-10-CM | POA: Diagnosis present

## 2024-03-17 DIAGNOSIS — E669 Obesity, unspecified: Secondary | ICD-10-CM | POA: Insufficient documentation

## 2024-03-17 DIAGNOSIS — K766 Portal hypertension: Secondary | ICD-10-CM | POA: Diagnosis present

## 2024-03-17 DIAGNOSIS — D500 Iron deficiency anemia secondary to blood loss (chronic): Secondary | ICD-10-CM | POA: Diagnosis not present

## 2024-03-17 DIAGNOSIS — Z79899 Other long term (current) drug therapy: Secondary | ICD-10-CM

## 2024-03-17 DIAGNOSIS — K921 Melena: Secondary | ICD-10-CM | POA: Diagnosis not present

## 2024-03-17 DIAGNOSIS — K7581 Nonalcoholic steatohepatitis (NASH): Secondary | ICD-10-CM | POA: Diagnosis present

## 2024-03-17 DIAGNOSIS — F32A Depression, unspecified: Secondary | ICD-10-CM | POA: Diagnosis present

## 2024-03-17 DIAGNOSIS — D649 Anemia, unspecified: Secondary | ICD-10-CM | POA: Insufficient documentation

## 2024-03-17 DIAGNOSIS — K219 Gastro-esophageal reflux disease without esophagitis: Secondary | ICD-10-CM | POA: Diagnosis present

## 2024-03-17 DIAGNOSIS — K3184 Gastroparesis: Secondary | ICD-10-CM | POA: Diagnosis present

## 2024-03-17 DIAGNOSIS — I8510 Secondary esophageal varices without bleeding: Secondary | ICD-10-CM | POA: Diagnosis present

## 2024-03-17 DIAGNOSIS — I952 Hypotension due to drugs: Secondary | ICD-10-CM | POA: Diagnosis present

## 2024-03-17 DIAGNOSIS — I252 Old myocardial infarction: Secondary | ICD-10-CM

## 2024-03-17 DIAGNOSIS — Z7984 Long term (current) use of oral hypoglycemic drugs: Secondary | ICD-10-CM

## 2024-03-17 DIAGNOSIS — T502X5A Adverse effect of carbonic-anhydrase inhibitors, benzothiadiazides and other diuretics, initial encounter: Secondary | ICD-10-CM | POA: Diagnosis present

## 2024-03-17 DIAGNOSIS — Z821 Family history of blindness and visual loss: Secondary | ICD-10-CM

## 2024-03-17 DIAGNOSIS — I10 Essential (primary) hypertension: Secondary | ICD-10-CM | POA: Diagnosis present

## 2024-03-17 DIAGNOSIS — Z8249 Family history of ischemic heart disease and other diseases of the circulatory system: Secondary | ICD-10-CM

## 2024-03-17 DIAGNOSIS — D61818 Other pancytopenia: Secondary | ICD-10-CM | POA: Diagnosis present

## 2024-03-17 DIAGNOSIS — E871 Hypo-osmolality and hyponatremia: Secondary | ICD-10-CM | POA: Diagnosis present

## 2024-03-17 DIAGNOSIS — Z833 Family history of diabetes mellitus: Secondary | ICD-10-CM

## 2024-03-17 DIAGNOSIS — R0602 Shortness of breath: Secondary | ICD-10-CM

## 2024-03-17 DIAGNOSIS — K769 Liver disease, unspecified: Secondary | ICD-10-CM

## 2024-03-17 DIAGNOSIS — J918 Pleural effusion in other conditions classified elsewhere: Secondary | ICD-10-CM

## 2024-03-17 DIAGNOSIS — E1143 Type 2 diabetes mellitus with diabetic autonomic (poly)neuropathy: Secondary | ICD-10-CM | POA: Diagnosis present

## 2024-03-17 DIAGNOSIS — H409 Unspecified glaucoma: Secondary | ICD-10-CM | POA: Diagnosis present

## 2024-03-17 DIAGNOSIS — K7460 Unspecified cirrhosis of liver: Secondary | ICD-10-CM | POA: Diagnosis present

## 2024-03-17 DIAGNOSIS — Z7982 Long term (current) use of aspirin: Secondary | ICD-10-CM

## 2024-03-17 DIAGNOSIS — I959 Hypotension, unspecified: Secondary | ICD-10-CM | POA: Diagnosis not present

## 2024-03-17 DIAGNOSIS — Z8542 Personal history of malignant neoplasm of other parts of uterus: Secondary | ICD-10-CM

## 2024-03-17 DIAGNOSIS — I8500 Esophageal varices without bleeding: Secondary | ICD-10-CM | POA: Diagnosis not present

## 2024-03-17 LAB — COMPREHENSIVE METABOLIC PANEL WITH GFR
ALT: 19 U/L (ref 0–44)
AST: 47 U/L — ABNORMAL HIGH (ref 15–41)
Albumin: 2.1 g/dL — ABNORMAL LOW (ref 3.5–5.0)
Alkaline Phosphatase: 322 U/L — ABNORMAL HIGH (ref 38–126)
Anion gap: 14 (ref 5–15)
BUN: 32 mg/dL — ABNORMAL HIGH (ref 6–20)
CO2: 26 mmol/L (ref 22–32)
Calcium: 8.7 mg/dL — ABNORMAL LOW (ref 8.9–10.3)
Chloride: 92 mmol/L — ABNORMAL LOW (ref 98–111)
Creatinine, Ser: 1.43 mg/dL — ABNORMAL HIGH (ref 0.44–1.00)
GFR, Estimated: 42 mL/min — ABNORMAL LOW (ref >60)
Glucose, Bld: 227 mg/dL — ABNORMAL HIGH (ref 70–99)
Potassium: 3.6 mmol/L (ref 3.5–5.1)
Sodium: 132 mmol/L — ABNORMAL LOW (ref 135–145)
Total Bilirubin: 5 mg/dL — ABNORMAL HIGH (ref 0.0–1.2)
Total Protein: 6.9 g/dL (ref 6.5–8.1)

## 2024-03-17 LAB — BODY FLUID CELL COUNT WITH DIFFERENTIAL
Eos, Fluid: 0 %
Lymphs, Fluid: 56 %
Monocyte-Macrophage-Serous Fluid: 42 % — ABNORMAL LOW (ref 50–90)
Neutrophil Count, Fluid: 2 % (ref 0–25)
Total Nucleated Cell Count, Fluid: 261 uL (ref 0–1000)

## 2024-03-17 LAB — CBC
HCT: 26.4 % — ABNORMAL LOW (ref 36.0–46.0)
Hemoglobin: 7.9 g/dL — ABNORMAL LOW (ref 12.0–15.0)
MCH: 22.3 pg — ABNORMAL LOW (ref 26.0–34.0)
MCHC: 29.9 g/dL — ABNORMAL LOW (ref 30.0–36.0)
MCV: 74.4 fL — ABNORMAL LOW (ref 80.0–100.0)
Platelets: 114 10*3/uL — ABNORMAL LOW (ref 150–400)
RBC: 3.55 MIL/uL — ABNORMAL LOW (ref 3.87–5.11)
RDW: 18 % — ABNORMAL HIGH (ref 11.5–15.5)
WBC: 4.8 10*3/uL (ref 4.0–10.5)
nRBC: 0 % (ref 0.0–0.2)

## 2024-03-17 LAB — BRAIN NATRIURETIC PEPTIDE: B Natriuretic Peptide: 243.5 pg/mL — ABNORMAL HIGH (ref 0.0–100.0)

## 2024-03-17 LAB — GLUCOSE, CAPILLARY
Glucose-Capillary: 262 mg/dL — ABNORMAL HIGH (ref 70–99)
Glucose-Capillary: 360 mg/dL — ABNORMAL HIGH (ref 70–99)

## 2024-03-17 LAB — APTT: aPTT: 37 s — ABNORMAL HIGH (ref 24–36)

## 2024-03-17 LAB — PROTIME-INR
INR: 1.4 — ABNORMAL HIGH (ref 0.8–1.2)
Prothrombin Time: 17.2 s — ABNORMAL HIGH (ref 11.4–15.2)

## 2024-03-17 LAB — LIPASE, BLOOD: Lipase: 73 U/L — ABNORMAL HIGH (ref 11–51)

## 2024-03-17 MED ORDER — ROSUVASTATIN CALCIUM 20 MG PO TABS: 20.0000 mg | ORAL_TABLET | Freq: Every day | ORAL | Status: DC

## 2024-03-17 MED ORDER — SODIUM CHLORIDE 0.9 % IV SOLN: 250.0000 mL | INTRAVENOUS | Status: AC | PRN

## 2024-03-17 MED ORDER — INSULIN ASPART 100 UNIT/ML IJ SOLN
0.0000 [IU] | Freq: Three times a day (TID) | INTRAMUSCULAR | Status: DC
  Administered 2024-03-17: 11 [IU] via SUBCUTANEOUS
  Administered 2024-03-18: 15 [IU] via SUBCUTANEOUS
  Administered 2024-03-18: 11 [IU] via SUBCUTANEOUS
  Administered 2024-03-18: 20 [IU] via SUBCUTANEOUS
  Administered 2024-03-19: 7 [IU] via SUBCUTANEOUS
  Administered 2024-03-19: 15 [IU] via SUBCUTANEOUS
  Administered 2024-03-19: 20 [IU] via SUBCUTANEOUS
  Administered 2024-03-20 (×3): 11 [IU] via SUBCUTANEOUS
  Administered 2024-03-21: 15 [IU] via SUBCUTANEOUS
  Filled 2024-03-17 (×11): qty 1

## 2024-03-17 MED ORDER — ACETAMINOPHEN 500 MG PO TABS
500.0000 mg | ORAL_TABLET | ORAL | Status: DC | PRN
  Administered 2024-03-17: 500 mg via ORAL
  Filled 2024-03-17 (×2): qty 1

## 2024-03-17 MED ORDER — LIDOCAINE HCL (PF) 1 % IJ SOLN
10.0000 mL | Freq: Once | INTRAMUSCULAR | Status: AC
  Administered 2024-03-17: 10 mL via INTRADERMAL

## 2024-03-17 MED ORDER — ENOXAPARIN SODIUM 60 MG/0.6ML IJ SOSY
50.0000 mg | PREFILLED_SYRINGE | INTRAMUSCULAR | Status: DC
  Administered 2024-03-17 – 2024-03-20 (×4): 50 mg via SUBCUTANEOUS
  Filled 2024-03-17 (×4): qty 0.6

## 2024-03-17 MED ORDER — CITALOPRAM HYDROBROMIDE 20 MG PO TABS
20.0000 mg | ORAL_TABLET | Freq: Every day | ORAL | Status: DC
  Administered 2024-03-17 – 2024-03-23 (×7): 20 mg via ORAL
  Filled 2024-03-17 (×7): qty 1

## 2024-03-17 MED ORDER — IOHEXOL 300 MG/ML  SOLN
75.0000 mL | Freq: Once | INTRAMUSCULAR | Status: AC | PRN
  Administered 2024-03-17: 75 mL via INTRAVENOUS

## 2024-03-17 MED ORDER — LEVETIRACETAM 750 MG PO TABS
750.0000 mg | ORAL_TABLET | Freq: Two times a day (BID) | ORAL | Status: DC
  Administered 2024-03-17 – 2024-03-23 (×13): 750 mg via ORAL
  Filled 2024-03-17 (×16): qty 1

## 2024-03-17 MED ORDER — SODIUM CHLORIDE 0.9% FLUSH: 3.0000 mL | INTRAVENOUS | Status: DC | PRN

## 2024-03-17 MED ORDER — ONDANSETRON 4 MG PO TBDP: 4.0000 mg | ORAL_TABLET | Freq: Three times a day (TID) | ORAL | Status: DC | PRN

## 2024-03-17 MED ORDER — MIRTAZAPINE 15 MG PO TABS
15.0000 mg | ORAL_TABLET | Freq: Every day | ORAL | Status: DC
  Administered 2024-03-17 – 2024-03-22 (×6): 15 mg via ORAL
  Filled 2024-03-17 (×6): qty 1

## 2024-03-17 MED ORDER — SODIUM CHLORIDE 0.9% FLUSH
3.0000 mL | Freq: Two times a day (BID) | INTRAVENOUS | Status: DC
  Administered 2024-03-17 – 2024-03-23 (×12): 3 mL via INTRAVENOUS

## 2024-03-17 MED ORDER — GABAPENTIN 300 MG PO CAPS
300.0000 mg | ORAL_CAPSULE | Freq: Every day | ORAL | Status: DC
  Administered 2024-03-17 – 2024-03-22 (×6): 300 mg via ORAL
  Filled 2024-03-17 (×6): qty 1

## 2024-03-17 MED ORDER — BUPROPION HCL ER (XL) 150 MG PO TB24
150.0000 mg | ORAL_TABLET | Freq: Every day | ORAL | Status: DC
  Administered 2024-03-17 – 2024-03-23 (×7): 150 mg via ORAL
  Filled 2024-03-17 (×7): qty 1

## 2024-03-17 MED ORDER — PANTOPRAZOLE SODIUM 40 MG PO TBEC
40.0000 mg | DELAYED_RELEASE_TABLET | Freq: Every day | ORAL | Status: DC
  Administered 2024-03-17 – 2024-03-21 (×5): 40 mg via ORAL
  Filled 2024-03-17 (×5): qty 1

## 2024-03-17 MED ORDER — BACLOFEN 10 MG PO TABS
10.0000 mg | ORAL_TABLET | Freq: Every day | ORAL | Status: DC
Start: 2024-03-17 — End: 2024-03-23
  Administered 2024-03-17 – 2024-03-22 (×6): 10 mg via ORAL
  Filled 2024-03-17 (×6): qty 1

## 2024-03-17 MED ORDER — LACTULOSE 10 GM/15ML PO SOLN: 30.0000 g | Freq: Two times a day (BID) | ORAL | Status: DC | PRN

## 2024-03-17 MED ORDER — ONDANSETRON HCL 4 MG/2ML IJ SOLN
4.0000 mg | Freq: Four times a day (QID) | INTRAMUSCULAR | Status: DC | PRN
  Administered 2024-03-20: 4 mg via INTRAVENOUS
  Filled 2024-03-17: qty 2

## 2024-03-17 MED ORDER — FERROUS SULFATE 325 (65 FE) MG PO TABS
325.0000 mg | ORAL_TABLET | Freq: Every day | ORAL | Status: DC
  Administered 2024-03-18 – 2024-03-23 (×6): 325 mg via ORAL
  Filled 2024-03-17 (×6): qty 1

## 2024-03-17 MED ORDER — ASPIRIN 81 MG PO TBEC
81.0000 mg | DELAYED_RELEASE_TABLET | Freq: Every day | ORAL | Status: DC
  Administered 2024-03-17 – 2024-03-21 (×5): 81 mg via ORAL
  Filled 2024-03-17 (×5): qty 1

## 2024-03-17 MED ORDER — RIFAXIMIN 550 MG PO TABS
550.0000 mg | ORAL_TABLET | Freq: Two times a day (BID) | ORAL | Status: DC
  Administered 2024-03-17 – 2024-03-23 (×12): 550 mg via ORAL
  Filled 2024-03-17 (×14): qty 1

## 2024-03-17 MED ORDER — FENTANYL CITRATE PF 50 MCG/ML IJ SOSY
50.0000 ug | PREFILLED_SYRINGE | Freq: Once | INTRAMUSCULAR | Status: AC
  Administered 2024-03-17: 50 ug via INTRAVENOUS
  Filled 2024-03-17: qty 1

## 2024-03-17 MED ORDER — SPIRONOLACTONE 25 MG PO TABS
100.0000 mg | ORAL_TABLET | Freq: Two times a day (BID) | ORAL | Status: DC
  Administered 2024-03-17 – 2024-03-19 (×4): 100 mg via ORAL
  Filled 2024-03-17 (×4): qty 4

## 2024-03-17 MED ORDER — ALBUMIN HUMAN 25 % IV SOLN
25.0000 g | Freq: Four times a day (QID) | INTRAVENOUS | Status: AC
  Administered 2024-03-17 – 2024-03-18 (×4): 25 g via INTRAVENOUS
  Filled 2024-03-17 (×5): qty 100

## 2024-03-17 MED ORDER — ZOLPIDEM TARTRATE 5 MG PO TABS
5.0000 mg | ORAL_TABLET | Freq: Every evening | ORAL | Status: DC | PRN
  Administered 2024-03-17 – 2024-03-21 (×5): 5 mg via ORAL
  Filled 2024-03-17 (×5): qty 1

## 2024-03-17 MED ORDER — FUROSEMIDE 10 MG/ML IJ SOLN
40.0000 mg | Freq: Two times a day (BID) | INTRAMUSCULAR | Status: DC
  Administered 2024-03-17 – 2024-03-18 (×2): 40 mg via INTRAVENOUS
  Filled 2024-03-17 (×2): qty 4

## 2024-03-17 NOTE — ED Notes (Signed)
 Patient back from [place] . Tech to transport to room.

## 2024-03-17 NOTE — H&P (Signed)
 History and Physical    Abigail Molina OZH:[id number] DOB: 1966/08/24 DOA: 03/17/2024  PCP: [facility] (Confirm with patient/family/NH records and if not entered, this has to be entered at [facility] point of entry) Patient coming from: Home  I have personally briefly reviewed patient's old medical records in [facility]  Chief Complaint: SOB, fluid building up;  HPI: Abigail Molina is a 58 y.o. female with medical history significant of NASH and liver cirrhosis, recent Klebsiella bacteremia and septic shock, HTN, HLD, IIDM, morbid obesity, presented with increasing shortness of breath and fluid overload.  Symptoms started 4 to 5 days ago and gradually getting worse, including right-sided flank pain, increasing exertional dyspnea and leg swelling.  Patient reported that she has been compliant with all her medications including Lasix 40 mg twice daily and spironolactone.  Denied any chest pain no fever chills, no diarrhea. ED Course: Afebrile, blood pressure 94/57, O2 saturation 96%.  CT abdominal pelvis showed large amount of ascites, moderate left-sided pleural effusion.  Blood work showed albumin 2.1K 3.61.4 compared to baseline 1.2-1.3, sodium 132 glucose 222   Review of Systems: As per HPI otherwise 14 point review of systems negative.    Past Medical History:  Diagnosis Date   Anemia    Anxiety    Arthritis    Cancer (HCC)    Endometrial cancer   Chronic pain    Cirrhosis of liver (HCC)    Depression    Diabetes mellitus without complication (HCC)    Fatty liver disease, nonalcoholic 2021   GERD (gastroesophageal reflux disease)    Glaucoma    Hyperlipidemia    Hypertension    Meningioma (HCC)    NSTEMI (non-ST elevated myocardial infarction) (HCC) 02/03/2024   OSA (obstructive sleep apnea) 05/08/2023   Overactive bladder    Pharmacologic therapy 11/02/2023   Psoriasis 2015    Past Surgical History:  Procedure Laterality Date   ABDOMINAL HYSTERECTOMY  2007    CHOLECYSTECTOMY  2000     reports that she has never smoked. She has never used smokeless tobacco. She reports that she does not currently use alcohol. She reports that she does not use drugs.  No Known Allergies  Family History  Problem Relation Age of Onset   Diabetes Mother    Hypertension Mother    Depression Mother    Obesity Mother    Vision loss Mother    Alcohol abuse Cousin    Bipolar disorder Cousin    Heart disease Maternal Grandfather    Alcohol abuse Maternal Grandmother    Stroke Paternal Grandfather      Prior to Admission medications   Medication Sig Start Date End Date Taking? Authorizing Provider  aspirin 81 MG tablet Take 81 mg by mouth daily.   Yes [provider]  buPROPion (WELLBUTRIN XL) 150 MG 24 hr tablet Take 1 tablet (150 mg total) by mouth daily. 11/16/23 03/17/24 Yes Hisada, Ivan Marion, MD  citalopram (CELEXA) 20 MG tablet Take 20 mg by mouth daily.   Yes [provider]  gabapentin (NEURONTIN) 300 MG capsule Take by mouth once. 06/02/23 06/01/24 Yes [provider]  LANTUS SOLOSTAR 100 UNIT/ML Solostar Pen Inject 6 Units into the skin daily. 02/08/24 04/10/25 Yes Donaciano Frizzle, MD  metFORMIN (GLUCOPHAGE-XR) 500 MG 24 hr tablet Take 1,000 mg by mouth 2 (two) times daily.   Yes [provider]  mirtazapine (REMERON) 15 MG tablet Take 1 tablet (15 mg total) by mouth at bedtime. 02/19/24  Yes Neysa Hotter, MD  naltrexone (DEPADE) 50 MG tablet Take 50 mg by mouth daily.   Yes [provider]  ondansetron (ZOFRAN-ODT) 4 MG disintegrating tablet Dissolve 1 tablet (4 mg total) in the mouth every twelve (12) hours as needed for nausea. 10/05/23 10/04/24 Yes [provider]  simvastatin (ZOCOR) 20 MG tablet Take 20 mg by mouth daily at 6 PM. 02/24/24 08/22/24 Yes [provider]  zolpidem (AMBIEN) 5 MG tablet Take 5 mg by mouth at bedtime as needed. 03/02/24  Yes [provider]  Accu-Chek Softclix Lancets  lancets SMARTSIG:Topical    [provider]  ascorbic acid (VITAMIN C) 500 MG tablet Take 1 tablet (500 mg total) by mouth 2 (two) times daily. 02/08/24   Marrion Coy, MD  Baclofen 5 MG TABS Take 2 tablets by mouth at bedtime.    [provider]  BD PEN NEEDLE NANO 2ND GEN 32G X 4 MM MISC USE 1 NIGHTLY 09/28/23   [provider]  Continuous Glucose Sensor (FREESTYLE LIBRE 3 SENSOR) MISC by Does not apply route daily.    [provider]  COSENTYX SENSOREADY, 300 MG, 150 MG/ML SOAJ Inject into the skin.   Yes [provider]  Ferrous Sulfate (SLOW FE) 142 (45 Fe) MG TBCR Take by mouth. 09/12/21  Yes [provider]  furosemide (LASIX) 40 MG tablet Take 40 mg by mouth 2 (two) times daily. 09/25/23  Yes [provider]  Insulin Pen Needle 32G X 6 MM MISC 1 Syringe by Does not apply route. Use with Victoza.    [provider]  lactulose (CHRONULAC) 10 GM/15ML solution SMARTSIG:Milliliter(s) By Mouth 06/19/22   [provider]  levETIRAcetam (KEPPRA) 500 MG tablet Take 750 mg by mouth 2 (two) times daily. 01/04/24 07/02/24  [provider]  MAGNESIUM-OXIDE 400 (240 Mg) MG tablet Take 2 tablets by mouth 2 (two) times daily. 10/14/23   [provider]  midodrine (PROAMATINE) 10 MG tablet Take 0.5 tablets (5 mg total) by mouth 3 (three) times daily with meals. 02/08/24   Marrion Coy, MD  Multiple Vitamin (MULTIVITAMIN WITH MINERALS) TABS tablet Take 1 tablet by mouth daily. 02/09/24   Marrion Coy, MD  pantoprazole (PROTONIX) 40 MG tablet TAKE ONE TABLET BY MOUTH EVERY DAY 10/24/19   McGowan, Carollee Herter A, PA-C  promethazine (PHENERGAN) 12.5 MG tablet Take by mouth. 05/23/22  Yes [provider]  rosuvastatin (CRESTOR) 20 MG tablet Take 1 tablet (20 mg total) by mouth daily. 02/09/24  Yes Marrion Coy, MD  Sharps Container (BD SHARPS COLLECTOR) MISC Use as directed to dispose of Cosentyx pens. 10/05/23   [provider]  sulfamethoxazole-trimethoprim (BACTRIM DS) 800-160 MG tablet Take 1 tablet by mouth 2 (two) times daily. Patient not taking: Reported on 03/17/2024 02/11/24   [provider]  thiamine (VITAMIN B1) 100 MG tablet Take 1 tablet (100 mg total) by mouth daily. 02/09/24   Marrion Coy, MD  XIFAXAN 550 MG TABS tablet Take 550 mg by mouth 2 (two) times daily.    [provider]  zinc sulfate, 50mg  elemental zinc, 220 (50 Zn) MG capsule Take 1 capsule (220 mg total) by mouth daily. 02/09/24   Marrion Coy, MD    Physical Exam: Vitals:   03/17/24 1100 03/17/24 1130 03/17/24 1140 03/17/24 1142  BP: (!) 105/57 (!) 95/49 (!) 108/58   Pulse:   91   Resp: 14 16 20    Temp:    98.4 F (36.9  C)  TempSrc:    Oral  SpO2:  96% 96%   Weight:      Height:        Constitutional: NAD, calm, comfortable Vitals:   03/17/24 1100 03/17/24 1130 03/17/24 1140 03/17/24 1142  BP: (!) 105/57 (!) 95/49 (!) 108/58   Pulse:   91   Resp: 14 16 20    Temp:    98.4 F (36.9 C)  TempSrc:    Oral  SpO2:  96% 96%   Weight:      Height:       Eyes: PERRL, lids and conjunctivae normal ENMT: Mucous membranes are moist. Posterior pharynx clear of any exudate or lesions.Normal dentition.  Neck: normal, supple, no masses, no thyromegaly Respiratory: Decreased breathing sound on left lower field, no wheezing, no crackles. Normal respiratory effort. No accessory muscle use.  Cardiovascular: Regular rate and rhythm, no murmurs / rubs / gallops.  2+ extremity edema. 2+ pedal pulses. No carotid bruits.  Abdomen: no tenderness, no masses palpated. No hepatosplenomegaly. Bowel sounds positive.  Positive ascites Musculoskeletal: no clubbing / cyanosis. No joint deformity upper and lower extremities. Good ROM, no contractures. Normal muscle tone.  Skin: no rashes, lesions, ulcers. No induration Neurologic: CN 2-12 grossly intact. Sensation intact, DTR normal. Strength 5/5 in all 4.  Psychiatric:  Normal judgment and insight. Alert and oriented x 3. Normal mood.     Labs on Admission: I have personally reviewed following labs and imaging studies  CBC: Recent Labs  Lab 03/17/24 0939  WBC 4.8  HGB 7.9*  HCT 26.4*  MCV 74.4*  PLT 114*   Basic Metabolic Panel: Recent Labs  Lab 03/17/24 0939  NA 132*  K 3.6  CL 92*  CO2 26  GLUCOSE 227*  BUN 32*  CREATININE 1.43*  CALCIUM 8.7*   GFR: Estimated Creatinine Clearance: 48 mL/min (A) (by C-G formula based on SCr of 1.43 mg/dL (H)). Liver Function Tests: Recent Labs  Lab 03/17/24 0939  AST 47*  ALT 19  ALKPHOS 322*  BILITOT 5.0*  PROT 6.9  ALBUMIN 2.1*   Recent Labs  Lab 03/17/24 0939  LIPASE 73*   No results for input(s): "AMMONIA" in the last 168 hours. Coagulation Profile: Recent Labs  Lab 03/17/24 0939  INR 1.4*   Cardiac Enzymes: No results for input(s): "CKTOTAL", "CKMB", "CKMBINDEX", "TROPONINI" in the last 168 hours. BNP (last 3 results) No results for input(s): "PROBNP" in the last 8760 hours. HbA1C: No results for input(s): "HGBA1C" in the last 72 hours. CBG: No results for input(s): "GLUCAP" in the last 168 hours. Lipid Profile: No results for input(s): "CHOL", "HDL", "LDLCALC", "TRIG", "CHOLHDL", "LDLDIRECT" in the last 72 hours. Thyroid Function Tests: No results for input(s): "TSH", "T4TOTAL", "FREET4", "T3FREE", "THYROIDAB" in the last 72 hours. Anemia Panel: No results for input(s): "VITAMINB12", "FOLATE", "FERRITIN", "TIBC", "IRON", "RETICCTPCT" in the last 72 hours. Urine analysis:    Component Value Date/Time   COLORURINE YELLOW (A) 01/26/2024 0834   APPEARANCEUR HAZY (A) 01/26/2024 0834   APPEARANCEUR Clear 03/12/2016 1224   LABSPEC 1.013 01/26/2024 0834   LABSPEC 1.025 11/09/2012 0940   PHURINE 6.0 01/26/2024 0834   GLUCOSEU >=500 (A) 01/26/2024 0834   GLUCOSEU Negative 11/09/2012 0940   HGBUR MODERATE (A) 01/26/2024 0834   BILIRUBINUR NEGATIVE 01/26/2024 0834    BILIRUBINUR Negative 03/12/2016 1224   BILIRUBINUR Negative 11/09/2012 0940   KETONESUR NEGATIVE 01/26/2024 0834   PROTEINUR NEGATIVE 01/26/2024 0834   NITRITE NEGATIVE 01/26/2024 0834  LEUKOCYTESUR SMALL (A) 01/26/2024 0834   LEUKOCYTESUR Trace 11/09/2012 0940    Radiological Exams on Admission: CT Chest W Contrast Result Date: 03/17/2024 CLINICAL DATA:  Pleural effusion. EXAM: CT CHEST WITH CONTRAST TECHNIQUE: Multidetector CT imaging of the chest was performed during intravenous contrast administration. RADIATION DOSE REDUCTION: This exam was performed according to the departmental dose-optimization program which includes automated exposure control, adjustment of the mA and/or kV according to patient size and/or use of iterative reconstruction technique. CONTRAST:  75mL OMNIPAQUE IOHEXOL 300 MG/ML  SOLN COMPARISON:  03/31/2023 FINDINGS: Cardiovascular: The heart size is upper normal to borderline enlarged. Coronary artery calcification is evident. Mild atherosclerotic calcification is noted in the wall of the thoracic aorta. Mediastinum/Nodes: No mediastinal lymphadenopathy. No evidence for gross hilar lymphadenopathy although assessment is limited by the lack of intravenous contrast on the current study. The esophagus has normal imaging features. There is no axillary lymphadenopathy. Lungs/Pleura: Subsegmental atelectasis or linear scarring is noted in the dependent right lower lung. Left lower lobe collapse/consolidation is new since 03/31/2023. No central obstructing mass lesion or lymphadenopathy evident moderate left pleural effusion is new since 03/31/2023. Upper Abdomen: Cirrhotic liver morphology is associated with large volume ascites, markedly progressive since 01/26/2024 abdomen/pelvis CT. Musculoskeletal: Multiple nonacute bilateral anterior rib fractures evident. No worrisome lytic or sclerotic osseous abnormality. IMPRESSION: 1. Left lower lobe collapse/consolidation with moderate left  pleural, new since chest CT of 03/31/2023. These changes were present on an abdomen pelvis CT of 01/26/2024 but are progressive since that time. 2. Cirrhotic liver morphology is associated with large volume ascites with ascites markedly progressive since 01/26/2024 abdomen/pelvis CT. 3.  Aortic Atherosclerois (ICD10-170.0) Electronically Signed   By: Kennith Center M.D.   On: 03/17/2024 12:04   DG Chest Port 1 View Result Date: 03/17/2024 CLINICAL DATA:  Right-sided abdominal pain with bilateral leg swelling. EXAM: PORTABLE CHEST 1 VIEW COMPARISON:  01/26/2024. FINDINGS: Low lung volumes with bibasilar atelectasis. No acute cardiopulmonary findings. Retrocardiac left base collapse/consolidation is associated with small to moderate left pleural effusion. Right lung clear. The cardiopericardial silhouette is within normal limits for size. No acute bony abnormality. Telemetry leads overlie the chest. IMPRESSION: 1. Low lung volumes with bibasilar atelectasis. 2. Retrocardiac left base collapse/consolidation with small to moderate left pleural effusion. Given unilateral disease, close follow-up recommended as neoplasm can present with these imaging features. Electronically Signed   By: Kennith Center M.D.   On: 03/17/2024 07:39    EKG: Independently reviewed.  Sinus rhythm, low voltage, no acute ST changes.  QTc 529  Assessment/Plan Principal Problem:   Cirrhosis (HCC)  (please populate well all problems here in Problem List. (For example, if patient is on BP meds at home and you resume or decide to hold them, it is a problem that needs to be her. Same for CAD, COPD, HLD and so on)  Acute on chronic cirrhosis decompensation NASH Chronic coagulopathy Chronic hypoalbuminemia Chronic bilirubinemia - Significant fluid overload, continue IV diuresis 40 Lasix twice daily - Paracentesis - Albumin every 6 hours x 4 doses to boost IV diuresis - Continue spironolactone twice daily - Worsening of bilirubinemia,  image study CT abdomen showed no biliary obstruction, trend LFTs - Hold off metformin and statin - Mentation at baseline, no signs of encephalopathy, continue lactulose - Long-term prognosis poor, outpatient follow-up with hepatology at [facility]  Abdominal pain - Rule out SBP, paracentesis today, also ordered body fluid culture, cell count - No white count, no fever, monitor off antibiotics for  now.  Borderline hypotension - Fluid overload - Hypotension secondary to cirrhosis, patient is asymptomatic, check orthostatic vital signs, continue IV Lasix.  Chronic iron deficiency anemia - H&H is stable, no signs of active bleeding - Continue iron supplement  Anxiety/depression - Continue home regimen of SSRI  Morbid obesity - BMI> 35 - Consider outpatient bariatric evaluation    DVT prophylaxis: Lovenox Code Status: \Full code Family Communication: None at bedside Disposition Plan: Patient is sick with significant cirrhosis decompensation fluid overload requiring IV diuresis and inpatient paracentesis, expect more than 2 midnight hospital stay Consults called: IR Admission status: Tele admit   [place] MD [facility] Pager [phone]  03/17/2024, 1:23 PM

## 2024-03-17 NOTE — TOC CM/SW Note (Signed)
 CHF patient.  No PT/OT eval pending.  No TOC needs at this time.  TOC signing off.  Please re-consult should any needs arise.

## 2024-03-17 NOTE — ED Provider Notes (Signed)
 [facility] Provider Note    Event Date/Time   First MD Initiated Contact with Patient 03/17/24 [phone]     (approximate)   History   Abdominal Pain   HPI  Abigail Molina is a 58 y.o. female with complex past medical history including diabetes, cirrhosis of liver with esophageal varices, NSTEMI, who presents with complaints of abdominal distention and also some lower extremity edema.  She reports this has been building up over the last week and has worsened significantly.     Physical Exam   Triage Vital Signs: ED Triage Vitals  Encounter Vitals Group     BP 03/17/24 0659 (!) 93/57     Systolic BP Percentile --      Diastolic BP Percentile --      Pulse Rate 03/17/24 0659 90     Resp 03/17/24 0659 16     Temp 03/17/24 0659 98.6 F (37 C)     Temp Source 03/17/24 0659 Oral     SpO2 03/17/24 0656 96 %     Weight 03/17/24 0702 97.5 kg (215 lb)     Height 03/17/24 0702 1.626 m (5\' 4" )     Head Circumference --      Peak Flow --      Pain Score 03/17/24 0702 10     Pain Loc --      Pain Education --      Exclude from Growth Chart --     Most recent vital signs: Vitals:   03/17/24 1140 03/17/24 1142  BP: (!) 108/58   Pulse: 91   Resp: 20   Temp:  98.4 F (36.9 C)  SpO2: 96%      General: Awake, no distress.  CV:  Good peripheral perfusion.  Resp:  Normal effort.  Abd:  Positive distention consistent with abdominal ascites, no clear tenderness to palpation Other:  Mild edema in the feet bilaterally   ED Results / Procedures / Treatments   Labs (all labs ordered are listed, but only abnormal results are displayed) Labs Reviewed  APTT - Abnormal; Notable for the following components:      Result Value   aPTT 37 (*)    All other components within normal limits  PROTIME-INR - Abnormal; Notable for the following components:   Prothrombin Time 17.2 (*)    INR 1.4 (*)    All other components within normal limits  CBC - Abnormal;  Notable for the following components:   RBC 3.55 (*)    Hemoglobin 7.9 (*)    HCT 26.4 (*)    MCV 74.4 (*)    MCH 22.3 (*)    MCHC 29.9 (*)    RDW 18.0 (*)    Platelets 114 (*)    All other components within normal limits  COMPREHENSIVE METABOLIC PANEL WITH GFR - Abnormal; Notable for the following components:   Sodium 132 (*)    Chloride 92 (*)    Glucose, Bld 227 (*)    BUN 32 (*)    Creatinine, Ser 1.43 (*)    Calcium 8.7 (*)    Albumin 2.1 (*)    AST 47 (*)    Alkaline Phosphatase 322 (*)    Total Bilirubin 5.0 (*)    GFR, Estimated 42 (*)    All other components within normal limits  LIPASE, BLOOD - Abnormal; Notable for the following components:   Lipase 73 (*)    All other components within normal limits  BRAIN  NATRIURETIC PEPTIDE - Abnormal; Notable for the following components:   B Natriuretic Peptide 243.5 (*)    All other components within normal limits     EKG  ED ECG REPORT I, Bryson Carbine, the attending physician, personally viewed and interpreted this ECG.  Date: 03/17/2024  Rhythm: normal sinus rhythm QRS Axis: normal Intervals: normal ST/T Wave abnormalities: normal Narrative Interpretation: no evidence of acute ischemia    RADIOLOGY Chest x-ray with retrocardiac consolidation    PROCEDURES:  Critical Care performed:   Procedures   MEDICATIONS ORDERED IN ED: Medications  sodium chloride flush (NS) 0.9 % injection 3 mL (has no administration in time range)  sodium chloride flush (NS) 0.9 % injection 3 mL (has no administration in time range)  0.9 %  sodium chloride infusion (has no administration in time range)  acetaminophen (TYLENOL) tablet 500 mg (has no administration in time range)  ondansetron (ZOFRAN) injection 4 mg (has no administration in time range)  furosemide (LASIX) injection 40 mg (has no administration in time range)  insulin aspart (novoLOG) injection 0-20 Units (has no administration in time range)  fentaNYL  (SUBLIMAZE) injection 50 mcg (50 mcg Intravenous Given 03/17/24 1140)  iohexol (OMNIPAQUE) 300 MG/ML solution 75 mL (75 mLs Intravenous Contrast Given 03/17/24 1126)     IMPRESSION / MDM / ASSESSMENT AND PLAN / ED COURSE  I reviewed the triage vital signs and the nursing notes. Patient's presentation is most consistent with severe exacerbation of chronic illness.  Patient presents with significant abdominal distention likely related to massive ascites.  She also has some lower extremity edema, she is on Lasix and spironolactone which she reports compliance with.  She denies significant shortness of breath.  Her blood pressure is in line with prior levels, she is on midodrine  Afebrile, reviewed medical records from 1 month ago patient was admitted for septic shock and required intubation  Will obtain labs, at a minimum the patient will need IR paracentesis, in the past she has had up to 15 L withdrawn so he likely will need albumin infusion as well  Not consistent with SBP, denies cough but given her history will obtain x-ray especially given her lower extremity edema  Chest x-ray with retrocardiac consolidation and adjacent effusion, will send for CT to better characterize  CT demonstrates moderate pleural effusion, this can likely explain her shortness of breath, have discussed with the hospitalist for admission to IR for paracentesis     FINAL CLINICAL IMPRESSION(S) / ED DIAGNOSES   Final diagnoses:  Ascites of liver  Pleural effusion associated with hepatic disorder  SOB (shortness of breath)     Rx / DC Orders   ED Discharge Orders     None        Note:  This document was prepared using Dragon voice recognition software and may include unintentional dictation errors.   Bryson Carbine, MD 03/17/24 1320

## 2024-03-17 NOTE — Procedures (Signed)
 PROCEDURE SUMMARY:  Successful US  guided paracentesis from right lateral abdomen.  A total of 3.0 liters of clear yellow fluid was drained off. Patient states that her blood pressure is not typically this low. Pressure was holding stable at mid 90s/mid 50s at the end of procedure.   No immediate complications.  Patient tolerated well.  EBL = trace  Specimen sent for labs.  Eliodoro Gullett M Ruchama Kubicek PA-C 03/17/2024 4:45 PM

## 2024-03-17 NOTE — ED Triage Notes (Signed)
 Pt arrives via EMS from home for right sided abdominal pain and bilat leg swelling - hx of cirrhosis. A/ox4, NAD, MAE, breathing even and unlabored.

## 2024-03-17 NOTE — ED Notes (Signed)
 CCMD contacted to place pt on cardiac monitoring

## 2024-03-17 NOTE — Progress Notes (Signed)
 Pt admitted to room 210 today at 1700 from ED. Pt A&O x4. Able to ambulate to bed with x1 assist. X2 RN skin check completed. Psoriasis rash noted on body. Oriented to room and call bell. Bed alarm set.

## 2024-03-18 DIAGNOSIS — R110 Nausea: Principal | ICD-10-CM

## 2024-03-18 DIAGNOSIS — R601 Generalized edema: Principal | ICD-10-CM

## 2024-03-18 DIAGNOSIS — R188 Other ascites: Principal | ICD-10-CM

## 2024-03-18 DIAGNOSIS — D500 Iron deficiency anemia secondary to blood loss (chronic): Secondary | ICD-10-CM

## 2024-03-18 DIAGNOSIS — K7581 Nonalcoholic steatohepatitis (NASH): Secondary | ICD-10-CM | POA: Diagnosis not present

## 2024-03-18 DIAGNOSIS — I9589 Other hypotension: Secondary | ICD-10-CM

## 2024-03-18 DIAGNOSIS — I959 Hypotension, unspecified: Secondary | ICD-10-CM

## 2024-03-18 DIAGNOSIS — K7460 Unspecified cirrhosis of liver: Secondary | ICD-10-CM | POA: Diagnosis not present

## 2024-03-18 LAB — COMPREHENSIVE METABOLIC PANEL WITH GFR
ALT: 18 U/L (ref 0–44)
AST: 39 U/L (ref 15–41)
Albumin: 2.5 g/dL — ABNORMAL LOW (ref 3.5–5.0)
Alkaline Phosphatase: 279 U/L — ABNORMAL HIGH (ref 38–126)
Anion gap: 14 (ref 5–15)
BUN: 35 mg/dL — ABNORMAL HIGH (ref 6–20)
CO2: 27 mmol/L (ref 22–32)
Calcium: 8.6 mg/dL — ABNORMAL LOW (ref 8.9–10.3)
Chloride: 87 mmol/L — ABNORMAL LOW (ref 98–111)
Creatinine, Ser: 1.33 mg/dL — ABNORMAL HIGH (ref 0.44–1.00)
GFR, Estimated: 46 mL/min — ABNORMAL LOW (ref >60)
Glucose, Bld: 353 mg/dL — ABNORMAL HIGH (ref 70–99)
Potassium: 3.5 mmol/L (ref 3.5–5.1)
Sodium: 128 mmol/L — ABNORMAL LOW (ref 135–145)
Total Bilirubin: 5 mg/dL — ABNORMAL HIGH (ref 0.0–1.2)
Total Protein: 6.9 g/dL (ref 6.5–8.1)

## 2024-03-18 LAB — GLUCOSE, CAPILLARY
Glucose-Capillary: 295 mg/dL — ABNORMAL HIGH (ref 70–99)
Glucose-Capillary: 347 mg/dL — ABNORMAL HIGH (ref 70–99)
Glucose-Capillary: 405 mg/dL — ABNORMAL HIGH (ref 70–99)
Glucose-Capillary: 351 mg/dL — ABNORMAL HIGH (ref 70–99)

## 2024-03-18 LAB — GLUCOSE, RANDOM: Glucose, Bld: 376 mg/dL — ABNORMAL HIGH (ref 70–99)

## 2024-03-18 MED ORDER — SPIRONOLACTONE 100 MG TABLET
ORAL_TABLET | Freq: Every day | ORAL | 2 refills | 90.00 days | Status: CP
Start: 2024-03-18 — End: 2025-03-18

## 2024-03-18 MED ORDER — SCOPOLAMINE 1 MG OVER 3 DAYS TRANSDERMAL PATCH
MEDICATED_PATCH | 0 refills | 30.00 days
Start: 2024-03-18 — End: ?

## 2024-03-18 MED ORDER — FUROSEMIDE 10 MG/ML IJ SOLN
20.0000 mg | Freq: Two times a day (BID) | INTRAMUSCULAR | Status: DC
  Administered 2024-03-18 – 2024-03-22 (×8): 20 mg via INTRAVENOUS
  Filled 2024-03-18 (×9): qty 4

## 2024-03-18 MED ORDER — INSULIN GLARGINE 100 UNITS/ML SOLOSTAR PEN
6.0000 [IU] | PEN_INJECTOR | Freq: Every day | SUBCUTANEOUS | Status: DC
  Filled 2024-03-18: qty 3

## 2024-03-18 MED ORDER — ENSURE ENLIVE PO LIQD
237.0000 mL | Freq: Two times a day (BID) | ORAL | Status: DC
  Administered 2024-03-18 – 2024-03-23 (×8): 237 mL via ORAL

## 2024-03-18 MED ORDER — MIDODRINE HCL 5 MG PO TABS
10.0000 mg | ORAL_TABLET | Freq: Three times a day (TID) | ORAL | Status: DC
  Administered 2024-03-18 – 2024-03-23 (×14): 10 mg via ORAL
  Filled 2024-03-18 (×14): qty 2

## 2024-03-18 MED ORDER — GUAIFENESIN-DM 100-10 MG/5ML PO SYRP
15.0000 mL | ORAL_SOLUTION | ORAL | Status: DC | PRN
  Administered 2024-03-18 – 2024-03-20 (×4): 15 mL via ORAL
  Filled 2024-03-18 (×4): qty 20

## 2024-03-18 MED ORDER — INSULIN GLARGINE-YFGN 100 UNIT/ML ~~LOC~~ SOLN
6.0000 [IU] | Freq: Every day | SUBCUTANEOUS | Status: DC
  Administered 2024-03-18: 6 [IU] via SUBCUTANEOUS
  Filled 2024-03-18: qty 0.06

## 2024-03-18 NOTE — Progress Notes (Signed)
      Significant event: Hypotension     CROSS COVER NOTE  NAME: Abigail Molina MRN: [id number] DOB : 06-12-1966    Concern as stated by nurse / staff    pt's BP is 89/47, HR 90. Her albumin  order has expired. Did you want to renew it? Thanks She's here for fluid overload, ascites, SOB. She's been having low BP. It's not getting much better. Aaron Aas She was ordered Albumin  q6, but the order has expired      Pertinent findings on chart review: Today's progress note reviewed: Briefly, here for acute decompensation of chronic cirrhosis with gross ascites, significant fluid overload, no encephalopathy.  On IV Lasix  and spironolactone .  Underwent paracentesis of 3 L.  Given albumin  today every 6 x 4 doses to boost IV diuresis  Patient assessment       03/18/2024    8:04 PM 03/18/2024    7:03 PM 03/18/2024    4:51 PM  Vitals with BMI  Systolic 123 89 99  Diastolic 62 47 32  Pulse 94  92   Physical Exam Vitals and nursing note reviewed.  Constitutional:      General: She is sleeping. She is not in acute distress. Cardiovascular:     Rate and Rhythm: Normal rate and regular rhythm.     Heart sounds: Normal heart sounds.  Pulmonary:     Effort: Pulmonary effort is normal.     Breath sounds: Normal breath sounds.  Abdominal:     General: There is distension.     Tenderness: There is no abdominal tenderness.  Neurological:     General: No focal deficit present.     Mental Status: She is easily aroused. She is lethargic.        Assessment and  Interventions   Assessment:  Hypotension in spite of albumin , likely secondary to diuresis  Plan: Gentle 500 NS bolus given Midodrine  10 mg  Continue to monitor blood pressure Will hold Lasix  and spironolactone  tonight and can resume in the a.m. if BP maintains.   On reassessment, BP improved     03/19/2024    4:33 AM 03/18/2024    8:04 PM 03/18/2024    7:03 PM  Vitals with BMI  Systolic 100 123 89  Diastolic 59 62 47  Pulse  87 94        CRITICAL CARE Performed by: Lanetta Pion   Total critical care time: 45 minutes  Critical care time was exclusive of separately billable procedures and treating other patients.  Critical care was necessary to treat or prevent imminent or life-threatening deterioration.  Critical care was time spent personally by me on the following activities: development of treatment plan with patient and/or surrogate as well as nursing, discussions with consultants, evaluation of patient's response to treatment, examination of patient, obtaining history from patient or surrogate, ordering and performing treatments and interventions, ordering and review of laboratory studies, ordering and review of radiographic studies, pulse oximetry and re-evaluation of patient's condition.

## 2024-03-18 NOTE — Inpatient Diabetes Management (Signed)
 Inpatient Diabetes Program Recommendations  AACE/ADA: New Consensus Statement on Inpatient Glycemic Control (2015)  Target Ranges:  Prepandial:   less than 140 mg/dL      Peak postprandial:   less than 180 mg/dL (1-2 hours)      Critically ill patients:  140 - 180 mg/dL    Latest Reference Range & Units 03/17/24 17:59 03/17/24 23:25 03/18/24 07:50  Glucose-Capillary 70 - 99 mg/dL 737 (H)  11 units Novolog   360 (H) 295 (H)  (H): Data is abnormally high    Admit with: Acute on chronic cirrhosis decompensation/ NASH  History: DM2, NASH and liver cirrhosis   Home DM Meds: Freestyle Libre 3 CGM       Lantus  6 units daily       Metformin  1000 mg BID  Current Orders: Novolog  Resistant Correction Scale/ SSI (0-20 units) TID AC     MD- Note CBG 295 this AM.  Per records, takes Lantus  6 units daily at home.  Please consider starting Semglee  6 units daily today    --Will follow patient during hospitalization--  Adina Rudolpho Arrow RN, MSN, CDCES Diabetes Coordinator Inpatient Glycemic Control Team Team Pager: [phone] (8a-5p)

## 2024-03-18 NOTE — TOC Initial Note (Signed)
 Transition of Care [facility]) - Initial/Assessment Note    Patient Details  Name: Abigail Molina MRN: [id number] Date of Birth: Nov 08, 1966  [facility]) CM/SW Contact:    Loman Risk, RN Phone Number: 03/18/2024, 3:37 PM  Clinical Narrative:                    Admitted for: chronic cirrhosis Admitted from: home with cousin  PCP: [facility] Current home health/prior home health/DME: [facility]   Active with [facility] for RN PT OT   Patient Goals and CMS Choice            Expected Discharge Plan and Services                                              Prior Living Arrangements/Services                       Activities of Daily Living      Permission Sought/Granted                  Emotional Assessment              Admission diagnosis:  Pleural effusion associated with hepatic disorder [K76.9, J91.8] Cirrhosis (HCC) [K74.60] SOB (shortness of breath) [R06.02] Ascites of liver [R18.8] Patient Active Problem List   Diagnosis Date Noted   Acute metabolic encephalopathy 02/03/2024   Pressure injury of skin 02/03/2024   NSTEMI (non-ST elevated myocardial infarction) (HCC) 02/03/2024   Septicemia (HCC) 02/03/2024   Acute respiratory failure with hypoxemia (HCC) 02/03/2024   Hyponatremia 02/03/2024   Septic shock (HCC) 01/26/2024   Dizziness 12/16/2023   Stage 3a chronic kidney disease (HCC) 12/16/2023   AKI (acute kidney injury) (HCC) 11/21/2023   Hyperkalemia 11/21/2023   Pre-syncope 11/21/2023   Abnormal drug screen 11/09/2023   Pain in joint of right shoulder 11/05/2023   Psoriasis 11/02/2023   Pharmacologic therapy 11/02/2023   Disorder of skeletal system 11/02/2023   Problems influencing health status 11/02/2023   Chronic knee pain (5th area of Pain) (Bilateral) 11/02/2023   Osteoarthritis of knees (Bilateral) 11/02/2023   Chronic shoulder pain (3ry area of Pain) (Right) 11/02/2023    Chronic intractable headache (2ry area of Pain) 11/02/2023   Chronic neck pain (2ry area of Pain) 11/02/2023   Chronic low back pain (4th area of Pain) (Bilateral) w/o sciatica 11/02/2023   Left upper quadrant pain 08/17/2023   OSA (obstructive sleep apnea) 05/08/2023   Dysphagia 02/02/2023   Generalized pruritus 02/02/2023   Lichen planus of tongue 01/03/2023   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 type 2 diabetes mellitus (HCC) 12/02/2022   Myopia of both eyes with astigmatism and presbyopia 12/02/2022   Current moderate episode of major depressive disorder (HCC) 11/06/2022   Chronic pain syndrome 11/06/2022   Other insomnia 11/06/2022   Portal hypertension (HCC) 10/08/2022   Nausea 09/22/2022   Other ascites 09/22/2022   Right upper quadrant abdominal pain 09/22/2022   Calcified cerebral meningioma (HCC) 08/16/2022   Hx of psoriasis 08/06/2022   Encephalopathy, portal systemic (HCC) 06/17/2022   Esophageal varices in cirrhosis (HCC) 06/17/2022   Secondary esophageal varices without bleeding (HCC) 06/17/2022   Cirrhosis (HCC) 06/17/2022   NPDR (nonproliferative diabetic retinopathy) (HCC) 06/17/2021  Ptosis of eyelid, left 06/17/2021   Diabetes mellitus (HCC) 06/17/2021   Hepatic steatosis 03/13/2021   Coronary artery disease involving native heart without angina pectoris 02/05/2021   H/O diabetic gastroparesis 01/09/2021   Retinopathy of left eye, background, proliferative 01/09/2021   Angular blepharoconjunctivitis of both eyes 12/21/2020   Cataract associated with type 2 diabetes mellitus (HCC) 12/21/2020   Dry eye syndrome, bilateral 12/21/2020   Chronic abdominal pain (1ry area of Pain) 03/04/2018   Diabetes (HCC) 12/12/2015   Hyperlipidemia 12/12/2015   Type 2 diabetes mellitus, with long-term current use of insulin  (HCC) 12/12/2015   Hypertension 05/23/2015   Acid reflux 05/23/2015   PFD (pelvic floor dysfunction)  04/07/2013   Hemorrhage of rectum and anus 03/17/2011   PCP:  [facility]:   [facility] [address]), [place] - [address] [place]) [place] [address] Phone: [phone] Fax: [phone]     Social Drivers of Health (SDOH) Social History: SDOH Screenings   Food Insecurity: Patient Unable To Answer (01/26/2024)  Housing: Patient Unable To Answer (01/26/2024)  Transportation Needs: Patient Unable To Answer (01/26/2024)  Utilities: Patient Unable To Answer (01/26/2024)  Depression (PHQ2-9): High Risk (11/02/2023)  Financial Resource Strain: Low Risk  (12/16/2023)   Received from [facility]  Physical Activity: Insufficiently Active (01/09/2021)   Received from [facility], [facility]  Social Connections: Moderately Isolated (09/02/2018)  Stress: Stress Concern Present (09/17/2022)   Received from [facility], [facility]  Tobacco Use: Low Risk  (03/17/2024)  Recent Concern: Tobacco Use - Medium Risk (01/04/2024)   Received from [facility] Literacy: Low Risk  (01/09/2021)   Received from [facility], [facility]   SDOH Interventions:     Readmission Risk Interventions    03/18/2024    3:36 PM  Readmission Risk Prevention Plan  Transportation Screening Complete  Medication Review (RN Care Manager) Complete  HRI or Home Care Consult Complete  Skilled Nursing Facility Not Applicable

## 2024-03-18 NOTE — Plan of Care (Signed)
  Problem: Education: Goal: Ability to describe self-care measures that may prevent or decrease complications (Diabetes Survival Skills Education) will improve Outcome: Progressing Goal: Individualized Educational Video(s) Outcome: Progressing   Prob

## 2024-03-18 NOTE — Progress Notes (Signed)
 Consult to HF Navigation Team Placed. Unfortunately, this patient does not meet criteria given no diagnosis of CHF. Please feel free to reach out with any questions or medication assistance needs.  Thank you for involving the HF Navigation Team in this patient's care.  Jaun Bash, PharmD, BCPS Clinical Pharmacist 07/09/2023 12:50 PM

## 2024-03-18 NOTE — Progress Notes (Addendum)
 Progress Note   Patient: Abigail Molina WNU:[id number] DOB: 05-21-1966 DOA: 03/17/2024     1 DOS: the patient was seen and examined on 03/18/2024   Brief hospital course: From H&P Lucky CHERI AYOTTE is a 58 y.o. female with medical history significant of NASH and liver cirrhosis, recent Klebsiella bacteremia and septic shock, HTN, HLD, IIDM, morbid obesity, presented with increasing shortness of breath and increased fluid overload.  Symptoms consistent with acute cirrhosis flare.  Patient reports compliance with her diuretic that include Lasix  40 mg twice daily and spironolactone .  Blood pressure was noted to be labile on presentation.  She is status post large volume paracentesis, currently receiving IV albumin  and diuretics.    Assessment and Plan:  Acute decompensation of chronic cirrhosis with gross ascites. -Underlying NASH,Chronic coagulopathy,Chronic hypoalbuminemia Chronic bilirubinemia -Significant fluid overload, continue IV diuresis 40 Lasix  twice daily -S/P Paracentesis with 3 L of fluid tapped.  Fluid studies remarkable for SBP Albumin  every 6 hours x 4 doses to boost IV diuresis -Continue spironolactone  twice daily -Worsening of bilirubinemia, image study CT abdomen showed no biliary obstruction, trend LFTs - Hold off metformin  and statin - Mentation at baseline, no signs of encephalopathy, continue lactulose  - Long-term prognosis poor, outpatient follow-up with hepatology at [facility]  Acute kidney injury: Mild increase in serum creatinine compared to baseline. Likely ATN from hypotension.  Patient has been challenged with IV albumin . Holding nephrotoxic medications including metformin .  Lasix  dose was decreased to 20 mg.Closely monitor kidney function on this dose  Hypervolemic Hyponatremia Most likely from Fluid overload secondary to cirrhosis  Diabetes Mellitus:  Uncontrolled blood sugar Resumed on Lantus  6 units per home regimen Last A1c was 8.5 in 01/2024 Optimize with insulin   sliding scale ADA diet advised  Acute on chronic anemia: -Underlying anemia of chronic disease - Pancytopenia secondary to chronic liver disease -Will closely follow-up with H&H. -No indication for PRBC transfusion at this time.   Abdominal pain - Ruled out SBP, fluid culture, cell count pending -monitor off antibiotics for now.   Borderline hypotension - Fluid overload.Hypotension secondary to cirrhosis, patient is asymptomatic, check orthostatic vital signs, continue IV Lasix . -Holding parameters in place for aldactone    Chronic iron deficiency anemia- H&H is stable, no signs of active bleeding Continue iron supplement   Anxiety/depression- Continue home regimen of SSRI   Morbid obesity- BMI> 35, likely due to excess calories, ascites fluid Will continue to optimize with diuretics as tolerated by blood pressure.      Subjective: Patient clinically feels better.  Abdomen remains slightly distended.  Denies any respiratory difficulty.  Denies any fever or chills.  Denies any nausea or vomiting.  She also denies any abdominal pain.  Blood pressure still remains labile.  She continues to receive albumin .  Physical Exam: Vitals:   03/18/24 0402 03/18/24 0425 03/18/24 0500 03/18/24 0748  BP: (!) 89/42 (!) 95/55  (!) 103/57  Pulse: 90 87  84  Resp: 16   15  Temp: 98.2 F (36.8 C)   98.4 F (36.9 C)  TempSrc: Oral     SpO2: 97%   99%  Weight:   99.8 kg   Height:       General: Patient was seen laying comfortably in bed not in any acute distress.  Looks well-hydrated. HEENT: Head is atraumatic.  Neck supple.  Conjunctiva anicteric. Neck: Supple with no JVD Chest clinically clear with no added sounds.  Diminished at the base of the lungs Abdomen: Distended, nontender, shifting  dullness appreciated CNS shows no focal deficits.  Cranial nerves intact Skin significant for psoriatic lesions in lower extremity  Data Reviewed: {Sodium is 128, potassium 3.5, chloride 87, bicarb  27, glucose 353, BUN 35, creatinine 1.33.  WBC was 4.8, hemoglobin 7.9, hematocrit 26.4, MCV was 74.4, platelet count is 114.  PT was 17.2, INR 1.4.  Status post paracentesis: 3 L tapped on 4/17  Family Communication: No family at bedside at this time.  Will attempt to reach out to family via phone.  Disposition: To be determined pending improvement with symptom. Status is: Inpatient Remains inpatient appropriate because: Ongoing medical condition  Planned Discharge Destination:  To be determined    Time spent: 35 minutes  Author: Theodora Fish, MD 03/18/2024 11:58 AM  For on call review [email or web address].

## 2024-03-19 DIAGNOSIS — E66812 Obesity, class 2: Secondary | ICD-10-CM

## 2024-03-19 DIAGNOSIS — E871 Hypo-osmolality and hyponatremia: Secondary | ICD-10-CM

## 2024-03-19 DIAGNOSIS — N179 Acute kidney failure, unspecified: Secondary | ICD-10-CM

## 2024-03-19 DIAGNOSIS — D649 Anemia, unspecified: Secondary | ICD-10-CM

## 2024-03-19 DIAGNOSIS — R188 Other ascites: Secondary | ICD-10-CM | POA: Diagnosis not present

## 2024-03-19 DIAGNOSIS — Z6837 Body mass index (BMI) 37.0-37.9, adult: Secondary | ICD-10-CM

## 2024-03-19 DIAGNOSIS — K7581 Nonalcoholic steatohepatitis (NASH): Secondary | ICD-10-CM

## 2024-03-19 DIAGNOSIS — K7460 Unspecified cirrhosis of liver: Secondary | ICD-10-CM | POA: Diagnosis not present

## 2024-03-19 LAB — GLUCOSE, CAPILLARY
Glucose-Capillary: 240 mg/dL — ABNORMAL HIGH (ref 70–99)
Glucose-Capillary: 341 mg/dL — ABNORMAL HIGH (ref 70–99)
Glucose-Capillary: 406 mg/dL — ABNORMAL HIGH (ref 70–99)
Glucose-Capillary: 375 mg/dL — ABNORMAL HIGH (ref 70–99)

## 2024-03-19 MED ORDER — SODIUM CHLORIDE 0.9 % IV SOLN
1.0000 g | INTRAVENOUS | Status: DC
  Administered 2024-03-19 – 2024-03-20 (×2): 1 g via INTRAVENOUS
  Filled 2024-03-19 (×3): qty 10

## 2024-03-19 MED ORDER — SPIRONOLACTONE 25 MG PO TABS
50.0000 mg | ORAL_TABLET | Freq: Two times a day (BID) | ORAL | Status: DC
  Administered 2024-03-19 – 2024-03-23 (×7): 50 mg via ORAL
  Filled 2024-03-19 (×8): qty 2

## 2024-03-19 MED ORDER — INSULIN ASPART 100 UNIT/ML IJ SOLN
5.0000 [IU] | Freq: Three times a day (TID) | INTRAMUSCULAR | Status: DC
  Administered 2024-03-19 – 2024-03-22 (×10): 5 [IU] via SUBCUTANEOUS
  Filled 2024-03-19 (×10): qty 1

## 2024-03-19 MED ORDER — INSULIN GLARGINE-YFGN 100 UNIT/ML ~~LOC~~ SOLN
10.0000 [IU] | Freq: Every day | SUBCUTANEOUS | Status: DC
  Administered 2024-03-19: 10 [IU] via SUBCUTANEOUS
  Filled 2024-03-19 (×2): qty 0.1

## 2024-03-19 MED ORDER — MORPHINE SULFATE (PF) 2 MG/ML IV SOLN
2.0000 mg | INTRAVENOUS | Status: DC | PRN
  Administered 2024-03-19 – 2024-03-23 (×11): 2 mg via INTRAVENOUS
  Filled 2024-03-19 (×11): qty 1

## 2024-03-19 NOTE — Progress Notes (Signed)
 Progress Note   Patient: Abigail Molina:[id number] DOB: 1966/01/13 DOA: 03/17/2024     2 DOS: the patient was seen and examined on 03/19/2024   Brief hospital course: Abigail Molina is a 58 y.o. female with medical history significant of NASH and liver cirrhosis, recent Klebsiella bacteremia and septic shock, HTN, HLD, IIDM, morbid obesity, presented with increasing shortness of breath and increased fluid overload.  Symptoms consistent with acute cirrhosis flare.  Patient reports compliance with her diuretic that include Lasix  40 mg twice daily and spironolactone .  Blood pressure was noted to be labile on presentation.  She is status post 3L paracentesis, got IV albumin  and diuretics.   Assessment and Plan: Acute decompensation of chronic cirrhosis with gross ascites. Underlying NASH, Chronic coagulopathy, Chronic hypoalbuminemia Chronic bilirubinemia Significant fluid overload, decreased IV diuresis to 20mg  Lasix  twice daily S/P Paracentesis with 3 L of fluid tapped.  Fluid studies remarkable for SBP Albumin  every 6 hours x 4 doses to boost IV diuresis Decreased spironolactone  to twice daily Continue to trend LFTs, CT unremarkable for obstruction. Hold off metformin  and statin Mentation at baseline, no signs of encephalopathy, continue lactulose  Long-term prognosis poor, outpatient follow-up with hepatology at [facility]   Acute kidney injury: Mild increase in serum creatinine compared to baseline. Likely ATN from hypotension.  Patient has been challenged with IV albumin . Holding nephrotoxic medications including metformin .  Lasix  dose was decreased to 20 mg, Aldactone  to 50mg .   Hypervolemic Hyponatremia Most likely from Fluid overload secondary to cirrhosis. Also her sugars higher side.   Uncontrolled type 2 Diabetes Mellitus:  high blood sugars noted. Increased Lantus  to 10 units.  Last A1c was 8.5 in 01/2024. Started 5 units Novolog  TID with meals. Continue insulin  sliding scale Carb  consistent diet.   Acute on chronic anemia: Pancytopenia secondary to chronic liver disease Monitor H&H. No indication for PRBC transfusion at this time.   Abdominal pain Possible SBP, PMN 261, fluid culture pending Will give Rocephin  therapy.   Borderline hypotension Fluid overload.Hypotension secondary to cirrhosis, patient is asymptomatic, check orthostatic vital signs. Home dose Midodrine  resumed.   Anxiety/depression- Continue home regimen of SSRI   Morbid obesity- BMI 37.35, likely due to excess calories, ascites fluid Will continue to optimize with diuretics as tolerated by blood pressure. Diet, weight reduction advised.      Out of bed to chair. Incentive spirometry. Nursing supportive care. Fall, aspiration precautions. Diet:  Diet Orders (From admission, onward)     Start     Ordered   03/19/24 0846  Diet heart healthy/carb modified Fluid consistency: Thin  Diet effective now       Question:  Fluid consistency:  Answer:  Thin   03/19/24 0845           DVT prophylaxis:   Level of care: Telemetry Medical   Code Status: Full Code  Subjective: Patient is seen and examined today morning. She is lying in bed. Is able to answer me appropriately. No abdominal pain, nausea. BP lower side, Sugars higher side noted.  Physical Exam: Vitals:   03/19/24 0433 03/19/24 0453 03/19/24 0728 03/19/24 1255  BP: (!) 100/59  110/61 (!) 104/57  Pulse: 87  88 91  Resp: 16  18   Temp: 97.7 F (36.5 C)  97.8 F (36.6 C)   TempSrc: Oral  Oral   SpO2: 90%  97%   Weight:  98.7 kg    Height:        General - Elderly  obese Caucasian female, no apparent distress HEENT - PERRLA, EOMI, atraumatic head, non tender sinuses. Lung - Clear, basal rales, rhonchi, no wheezes. Heart - S1, S2 heard, no murmurs, rubs, 1+ pedal edema. Abdomen - Soft, non tender, distended, bowel sounds good Neuro - Alert, awake and oriented x 3, non focal exam. Skin - Warm and dry.  Data  Reviewed:      Latest Ref Rng & Units 03/17/2024    9:39 AM 02/08/2024    5:08 AM 02/04/2024    5:20 AM  CBC  WBC 4.0 - 10.5 K/uL 4.8  5.6  6.8   Hemoglobin 12.0 - 15.0 g/dL 7.9  8.4  8.8   Hematocrit 36.0 - 46.0 % 26.4  26.9  27.8   Platelets 150 - 400 K/uL 114  126  160       Latest Ref Rng & Units 03/18/2024    5:49 PM 03/18/2024    3:31 AM 03/17/2024    9:39 AM  BMP  Glucose 70 - 99 mg/dL 161  096  045   BUN 6 - 20 mg/dL  35  32   Creatinine 4.09 - 1.00 mg/dL  8.11  9.14   Sodium 782 - 145 mmol/L  128  132   Potassium 3.5 - 5.1 mmol/L  3.5  3.6   Chloride 98 - 111 mmol/L  87  92   CO2 22 - 32 mmol/L  27  26   Calcium  8.9 - 10.3 mg/dL  8.6  8.7    US  Paracentesis Result Date: 03/17/2024 INDICATION: 58 year old female with a history of NASH liver cirrhosis who presented to the ED for worsening shortness of breath and lower extremity swelling for the past several weeks. Request for diagnostic and therapeutic paracentesis. EXAM: ULTRASOUND GUIDED PARACENTESIS MEDICATIONS: 1% lidocaine , 8 mL. COMPLICATIONS: None immediate. PROCEDURE: Informed written consent was obtained from the patient after a discussion of the risks, benefits and alternatives to treatment. A timeout was performed prior to the initiation of the procedure. Initial ultrasound scanning demonstrates a large amount of ascites within the right lower abdominal quadrant. The right lower abdomen was prepped and draped in the usual sterile fashion. 1% lidocaine  was used for local anesthesia. Following this, a 19 gauge, 7-cm, Yueh catheter was introduced. An ultrasound image was saved for documentation purposes. The paracentesis was performed. The catheter was removed and a dressing was applied. The patient tolerated the procedure well without immediate post procedural complication. FINDINGS: A total of approximately 3 L of clear yellow fluid was removed. Samples were sent to the laboratory as requested by the clinical team. IMPRESSION:  Successful ultrasound-guided paracentesis yielding 3 liters of peritoneal fluid. Procedure performed by: Estella Helling, PA-C PLAN: If the patient eventually requires >/=2 paracenteses in a 30 day period, candidacy for formal evaluation by the [facility] will be assessed. Electronically Signed   By: Elene Griffes M.D.   On: 03/17/2024 17:45    Family Communication: Discussed with patient, understand and agree. All questions answered.  Disposition: Status is: Inpatient Remains inpatient appropriate because: low bp, need paracentesis  Planned Discharge Destination: Home with Home Health     Time spent: 42 minutes  Author: Aisha Hove, MD 03/19/2024 1:32 PM Secure chat 7am to 7pm For on call review [email or web address].

## 2024-03-19 NOTE — Plan of Care (Signed)

## 2024-03-20 DIAGNOSIS — R188 Other ascites: Secondary | ICD-10-CM | POA: Diagnosis not present

## 2024-03-20 DIAGNOSIS — N179 Acute kidney failure, unspecified: Secondary | ICD-10-CM | POA: Diagnosis not present

## 2024-03-20 DIAGNOSIS — E66812 Obesity, class 2: Secondary | ICD-10-CM | POA: Diagnosis not present

## 2024-03-20 DIAGNOSIS — K7460 Unspecified cirrhosis of liver: Secondary | ICD-10-CM | POA: Diagnosis not present

## 2024-03-20 LAB — BASIC METABOLIC PANEL WITH GFR
Anion gap: 11 (ref 5–15)
BUN: 34 mg/dL — ABNORMAL HIGH (ref 6–20)
CO2: 28 mmol/L (ref 22–32)
Calcium: 9.1 mg/dL (ref 8.9–10.3)
Chloride: 93 mmol/L — ABNORMAL LOW (ref 98–111)
Creatinine, Ser: 1.47 mg/dL — ABNORMAL HIGH (ref 0.44–1.00)
GFR, Estimated: 41 mL/min — ABNORMAL LOW (ref >60)
Glucose, Bld: 284 mg/dL — ABNORMAL HIGH (ref 70–99)
Potassium: 4 mmol/L (ref 3.5–5.1)
Sodium: 132 mmol/L — ABNORMAL LOW (ref 135–145)

## 2024-03-20 LAB — BODY FLUID CULTURE W GRAM STAIN
Culture: NO GROWTH
Gram Stain: NONE SEEN

## 2024-03-20 LAB — CBC
HCT: 23.6 % — ABNORMAL LOW (ref 36.0–46.0)
Hemoglobin: 7.3 g/dL — ABNORMAL LOW (ref 12.0–15.0)
MCH: 22.2 pg — ABNORMAL LOW (ref 26.0–34.0)
MCHC: 30.9 g/dL (ref 30.0–36.0)
MCV: 71.7 fL — ABNORMAL LOW (ref 80.0–100.0)
Platelets: 117 10*3/uL — ABNORMAL LOW (ref 150–400)
RBC: 3.29 MIL/uL — ABNORMAL LOW (ref 3.87–5.11)
RDW: 18.7 % — ABNORMAL HIGH (ref 11.5–15.5)
WBC: 3.9 10*3/uL — ABNORMAL LOW (ref 4.0–10.5)
nRBC: 0 % (ref 0.0–0.2)

## 2024-03-20 LAB — GLUCOSE, CAPILLARY
Glucose-Capillary: 277 mg/dL — ABNORMAL HIGH (ref 70–99)
Glucose-Capillary: 270 mg/dL — ABNORMAL HIGH (ref 70–99)
Glucose-Capillary: 265 mg/dL — ABNORMAL HIGH (ref 70–99)
Glucose-Capillary: 342 mg/dL — ABNORMAL HIGH (ref 70–99)

## 2024-03-20 MED ORDER — INSULIN GLARGINE-YFGN 100 UNIT/ML ~~LOC~~ SOLN
14.0000 [IU] | Freq: Every day | SUBCUTANEOUS | Status: DC
  Administered 2024-03-20: 14 [IU] via SUBCUTANEOUS
  Filled 2024-03-20: qty 0.14

## 2024-03-20 NOTE — Progress Notes (Signed)
 BP 102/51, heart rate 91. MD notified and said to give lasix  but hold aldactone 

## 2024-03-20 NOTE — Plan of Care (Signed)
   Problem: Education: Goal: Ability to describe self-care measures that may prevent or decrease complications (Diabetes Survival Skills Education) will improve Outcome: Progressing Goal: Individualized Educational Video(s) Outcome: Progressing   Problem: Coping: Goal: Ability to adjust to condition or change in health will improve Outcome: Progressing

## 2024-03-20 NOTE — Progress Notes (Signed)
 Progress Note   Patient: Abigail Molina UJW:[id number] DOB: 16-Mar-1966 DOA: 03/17/2024     3 DOS: the patient was seen and examined on 03/20/2024   Brief hospital course: JAIDENCE GEISLER is a 58 y.o. female with medical history significant of NASH and liver cirrhosis, recent Klebsiella bacteremia and septic shock, HTN, HLD, IIDM, morbid obesity, presented with increasing shortness of breath and increased fluid overload.  Symptoms consistent with acute cirrhosis flare.  Patient reports compliance with her diuretic that include Lasix  40 mg twice daily and spironolactone .  Blood pressure was noted to be labile on presentation.  She is status post 3L paracentesis, got IV albumin  and diuretics.   Assessment and Plan: Acute decompensation of chronic cirrhosis with gross ascites. Underlying NASH, Chronic coagulopathy, Chronic hypoalbuminemia Chronic bilirubinemia Significant fluid overload, decreased IV diuresis to 20mg  Lasix  twice daily due to low BP. S/P Paracentesis with 3 L of fluid tapped.  Fluid studies remarkable for SBP started rocephin . Will get repeat US  guided tap with max 5L removal. Decreased spironolactone  to twice daily with hold parameters. Continue to trend LFTs, CT unremarkable for obstruction. Hold off metformin  and statin Mentation at baseline, no signs of encephalopathy, continue lactulose  Long-term prognosis poor, outpatient follow-up with hepatology at [facility]   Acute kidney injury: Mild increase in serum creatinine compared to baseline. Likely ATN from hypotension.  Patient has been challenged with IV albumin . Holding nephrotoxic medications including metformin .  Lasix  dose was decreased to 20 mg, Aldactone  to 50mg .   Hypervolemic Hyponatremia Most likely from Fluid overload secondary to cirrhosis. Also her sugars higher side causing pseudohyponatremia.   Uncontrolled type 2 Diabetes Mellitus:  high blood sugars noted. Increased Lantus  to 14 units.  Last A1c was 8.5 in  01/2024. Continue 5 units Novolog  TID with meals. Continue insulin  sliding scale Carb consistent diet.   Acute on chronic anemia: Pancytopenia secondary to chronic liver disease Monitor H&H. No indication for PRBC transfusion at this time.   Abdominal pain Possible SBP, PMN 261, fluid culture pending Started Rocephin  therapy.   Borderline hypotension Fluid overload. Hypotension secondary to cirrhosis, patient is asymptomatic, check orthostatic vital signs. Home dose Midodrine  resumed. Aldactone  halved with hold parameters. Lasix  dose decreased to 20 bid.   Anxiety/depression- Continue home regimen of SSRI   Morbid obesity- BMI 37.35, likely due to excess calories, ascites fluid Continue to optimize with diuretics as tolerated by blood pressure. Diet, weight reduction advised.      Out of bed to chair. Incentive spirometry. Nursing supportive care. Fall, aspiration precautions. Diet:  Diet Orders (From admission, onward)     Start     Ordered   03/19/24 0846  Diet heart healthy/carb modified Fluid consistency: Thin  Diet effective now       Question:  Fluid consistency:  Answer:  Thin   03/19/24 0845           DVT prophylaxis: Lovenox   Level of care: Telemetry Medical   Code Status: Full Code  Subjective: Patient is seen and examined today morning. She has abdominal distention, discomfort. No nausea. Eating fair. Did not get out of bed.  Physical Exam: Vitals:   03/20/24 0431 03/20/24 0451 03/20/24 0813 03/20/24 1458  BP: (!) 111/53  (!) 102/51 (!) 115/56  Pulse: 92  93 94  Resp: 20  16 16   Temp: 98.2 F (36.8 C)  98.8 F (37.1 C) 98.3 F (36.8 C)  TempSrc: Oral   Oral  SpO2: 94%  96%   Weight:  98.8 kg    Height:        General - Elderly obese Caucasian female, no apparent distress HEENT - PERRLA, EOMI, atraumatic head, non tender sinuses. Lung - Clear, basal rales, rhonchi, no wheezes. Heart - S1, S2 heard, no murmurs, rubs, 2+ pedal  edema. Abdomen - Soft, non tender, distended, bowel sounds good Neuro - Alert, awake and oriented x 3, non focal exam. Skin - Warm and dry.  Data Reviewed:      Latest Ref Rng & Units 03/20/2024    4:57 AM 03/17/2024    9:39 AM 02/08/2024    5:08 AM  CBC  WBC 4.0 - 10.5 K/uL 3.9  4.8  5.6   Hemoglobin 12.0 - 15.0 g/dL 7.3  7.9  8.4   Hematocrit 36.0 - 46.0 % 23.6  26.4  26.9   Platelets 150 - 400 K/uL 117  114  126       Latest Ref Rng & Units 03/20/2024    4:57 AM 03/18/2024    5:49 PM 03/18/2024    3:31 AM  BMP  Glucose 70 - 99 mg/dL 409  811  914   BUN 6 - 20 mg/dL 34   35   Creatinine 7.82 - 1.00 mg/dL 9.56   2.13   Sodium 086 - 145 mmol/L 132   128   Potassium 3.5 - 5.1 mmol/L 4.0   3.5   Chloride 98 - 111 mmol/L 93   87   CO2 22 - 32 mmol/L 28   27   Calcium  8.9 - 10.3 mg/dL 9.1   8.6    No results found.   Family Communication: Discussed with patient, understand and agree. All questions answered.  Disposition: Status is: Inpatient Remains inpatient appropriate because: low bp, high sugars, PT, need paracentesis  Planned Discharge Destination: Home with Home Health     Time spent: 40 minutes  Author: Aisha Hove, MD 03/20/2024 3:03 PM Secure chat 7am to 7pm For on call review [email or web address].

## 2024-03-21 ENCOUNTER — Inpatient Hospital Stay

## 2024-03-21 ENCOUNTER — Other Ambulatory Visit: Payer: Self-pay

## 2024-03-21 DIAGNOSIS — N179 Acute kidney failure, unspecified: Secondary | ICD-10-CM | POA: Diagnosis not present

## 2024-03-21 DIAGNOSIS — E66812 Obesity, class 2: Secondary | ICD-10-CM | POA: Diagnosis not present

## 2024-03-21 DIAGNOSIS — E669 Obesity, unspecified: Secondary | ICD-10-CM | POA: Insufficient documentation

## 2024-03-21 DIAGNOSIS — R188 Other ascites: Secondary | ICD-10-CM | POA: Diagnosis not present

## 2024-03-21 DIAGNOSIS — D649 Anemia, unspecified: Secondary | ICD-10-CM | POA: Insufficient documentation

## 2024-03-21 DIAGNOSIS — I8510 Secondary esophageal varices without bleeding: Secondary | ICD-10-CM

## 2024-03-21 DIAGNOSIS — K7460 Unspecified cirrhosis of liver: Secondary | ICD-10-CM | POA: Diagnosis not present

## 2024-03-21 LAB — GLUCOSE, CAPILLARY
Glucose-Capillary: 301 mg/dL — ABNORMAL HIGH (ref 70–99)
Glucose-Capillary: 261 mg/dL — ABNORMAL HIGH (ref 70–99)
Glucose-Capillary: 282 mg/dL — ABNORMAL HIGH (ref 70–99)
Glucose-Capillary: 203 mg/dL — ABNORMAL HIGH (ref 70–99)

## 2024-03-21 LAB — CBC
HCT: 22.4 % — ABNORMAL LOW (ref 36.0–46.0)
Hemoglobin: 6.7 g/dL — ABNORMAL LOW (ref 12.0–15.0)
MCH: 22.1 pg — ABNORMAL LOW (ref 26.0–34.0)
MCHC: 29.9 g/dL — ABNORMAL LOW (ref 30.0–36.0)
MCV: 73.9 fL — ABNORMAL LOW (ref 80.0–100.0)
Platelets: 117 10*3/uL — ABNORMAL LOW (ref 150–400)
RBC: 3.03 MIL/uL — ABNORMAL LOW (ref 3.87–5.11)
RDW: 19.2 % — ABNORMAL HIGH (ref 11.5–15.5)
WBC: 4.5 10*3/uL (ref 4.0–10.5)
nRBC: 0 % (ref 0.0–0.2)

## 2024-03-21 LAB — BASIC METABOLIC PANEL WITH GFR
Anion gap: 9 (ref 5–15)
BUN: 36 mg/dL — ABNORMAL HIGH (ref 6–20)
CO2: 27 mmol/L (ref 22–32)
Calcium: 8.8 mg/dL — ABNORMAL LOW (ref 8.9–10.3)
Chloride: 93 mmol/L — ABNORMAL LOW (ref 98–111)
Creatinine, Ser: 1.22 mg/dL — ABNORMAL HIGH (ref 0.44–1.00)
GFR, Estimated: 51 mL/min — ABNORMAL LOW (ref >60)
Glucose, Bld: 359 mg/dL — ABNORMAL HIGH (ref 70–99)
Potassium: 4.3 mmol/L (ref 3.5–5.1)
Sodium: 129 mmol/L — ABNORMAL LOW (ref 135–145)

## 2024-03-21 LAB — PREPARE RBC (CROSSMATCH)

## 2024-03-21 MED ORDER — LIDOCAINE HCL (PF) 1 % IJ SOLN
10.0000 mL | Freq: Once | INTRAMUSCULAR | Status: AC
  Administered 2024-03-21: 10 mL
  Filled 2024-03-21: qty 10

## 2024-03-21 MED ORDER — INSULIN GLARGINE-YFGN 100 UNIT/ML ~~LOC~~ SOLN
20.0000 [IU] | Freq: Two times a day (BID) | SUBCUTANEOUS | Status: DC
  Administered 2024-03-21 – 2024-03-23 (×5): 20 [IU] via SUBCUTANEOUS
  Filled 2024-03-21 (×6): qty 0.2

## 2024-03-21 MED ORDER — PANTOPRAZOLE SODIUM 40 MG IV SOLR
40.0000 mg | Freq: Two times a day (BID) | INTRAVENOUS | Status: DC
  Administered 2024-03-21 – 2024-03-22 (×3): 40 mg via INTRAVENOUS
  Filled 2024-03-21 (×4): qty 10

## 2024-03-21 MED ORDER — OCTREOTIDE LOAD VIA INFUSION
50.0000 ug | Freq: Once | INTRAVENOUS | Status: AC
  Administered 2024-03-21: 50 ug via INTRAVENOUS
  Filled 2024-03-21: qty 25

## 2024-03-21 MED ORDER — INSULIN ASPART 100 UNIT/ML IJ SOLN: 0.0000 [IU] | Freq: Every day | INTRAMUSCULAR | Status: DC

## 2024-03-21 MED ORDER — OCTREOTIDE ACETATE 500 MCG/ML IJ SOLN
50.0000 ug/h | INTRAMUSCULAR | Status: DC
  Administered 2024-03-21 – 2024-03-22 (×3): 50 ug/h via INTRAVENOUS
  Filled 2024-03-21 (×6): qty 1

## 2024-03-21 MED ORDER — SODIUM CHLORIDE 0.9% IV SOLUTION: Freq: Once | INTRAVENOUS | Status: AC

## 2024-03-21 MED ORDER — SODIUM CHLORIDE 0.9 % IV SOLN
1.0000 g | INTRAVENOUS | Status: DC
  Administered 2024-03-21 – 2024-03-22 (×2): 1 g via INTRAVENOUS
  Filled 2024-03-21 (×3): qty 10

## 2024-03-21 MED ORDER — ALBUMIN HUMAN 25 % IV SOLN
12.5000 g | Freq: Four times a day (QID) | INTRAVENOUS | Status: AC
  Administered 2024-03-21 – 2024-03-22 (×4): 12.5 g via INTRAVENOUS
  Filled 2024-03-21 (×4): qty 50

## 2024-03-21 MED ORDER — INSULIN ASPART 100 UNIT/ML IJ SOLN
0.0000 [IU] | Freq: Three times a day (TID) | INTRAMUSCULAR | Status: DC
  Administered 2024-03-21 (×2): 11 [IU] via SUBCUTANEOUS
  Administered 2024-03-22 (×2): 3 [IU] via SUBCUTANEOUS
  Administered 2024-03-22 – 2024-03-23 (×3): 4 [IU] via SUBCUTANEOUS
  Filled 2024-03-21 (×7): qty 1

## 2024-03-21 NOTE — Plan of Care (Signed)

## 2024-03-21 NOTE — Inpatient Diabetes Management (Addendum)
 Inpatient Diabetes Program Recommendations  AACE/ADA: New Consensus Statement on Inpatient Glycemic Control   Target Ranges:  Prepandial:   less than 140 mg/dL      Peak postprandial:   less than 180 mg/dL (1-2 hours)      Critically ill patients:  140 - 180 mg/dL    Latest Reference Range & Units 03/20/24 07:34 03/20/24 11:39 03/20/24 16:42 03/20/24 21:04 03/21/24 07:53  Glucose-Capillary 70 - 99 mg/dL 914 (H) 782 (H) 956 (H) 342 (H) 301 (H)   Review of Glycemic Control  Diabetes history: DM2 Outpatient Diabetes medications: Lantus  60 units daily, Humalog 8 units TID with meals, Metformin  500 mg BID, Invokana 300 mg daily Current orders for Inpatient glycemic control: Semglee  14 units at bedtime, Novolog  0-20 units TID with meals, Novolog  5 units TID with meals  Inpatient Diabetes Program Recommendations:    Insulin : Please consider increasing Semglee  to 20 units BID (to start now) and meal coverage to Novolog  10 units TID with meals. Also, please order Novolog  0-5 units at bedtime for bedtime correction.  NOTE: Per home medication list, patient taking Lantus  6 units daily and Metformin  1000 mg BID  for DM control. Noted patient last seen Endocrinology on 11/06/23 and per office note patient was prescribed Lantus  60 units at bedtime, Humalog 10 units TID with meals, Metformin  500 mg BID, and Invokana 300 mg daily for DM.  Spoke with patient over the phone to inquire about DM medications Patient reports that she is taking Lantus  60 units daily, Humalog 8 units TID with meals, Metformin  500 mg BID, and Invokana 300 mg daily for DM; patient also using Free[employer] Libre3 CGM for glucose monitoring. Updated home medication list in chart to reflect what patient is actually taking for DM.  Thanks, Beacher Limerick, RN, MSN, CDCES Diabetes Coordinator Inpatient Diabetes Program [phone] (Team Pager from 8am to 5pm)

## 2024-03-21 NOTE — Progress Notes (Signed)
 Patient alert and oriented times 4 , ambulating to bathroom with walker and RN , Patient was between bathroom door and wall when patient bumped into glove boxes and went down on one knee, RN was unable to get patient back to standing position so staff assisted with getting patient to bathroom and back to bed. Patient reports that she did  not hurt anything. VSS. Abigail Molina patient's cousin was notified via phone. MD Aisha Hove was notified via secure chat.

## 2024-03-21 NOTE — Procedures (Signed)
 PROCEDURE SUMMARY:  Successful image-guided therapeutic paracentesis from the left lower abdomen.  Yielded 3.6 liters of yellow fluid.  No immediate complications.  EBL: zero Patient tolerated well.    Please see imaging section of Epic for full dictation.  Chinmay Squier NP 03/21/2024 1:56 PM

## 2024-03-21 NOTE — Progress Notes (Signed)
 Progress Note   Patient: Abigail Molina DOB: 17-Dec-1965 DOA: 03/17/2024     4 DOS: the patient was seen and examined on 03/21/2024   Brief hospital course: Abigail Molina is a 58 y.o. female with medical history significant of NASH and liver cirrhosis, recent Klebsiella bacteremia and septic shock, HTN, HLD, IIDM, morbid obesity, presented with increasing shortness of breath and increased fluid overload.  Symptoms consistent with acute cirrhosis flare.  Patient reports compliance with her diuretic that include Lasix  40 mg twice daily and spironolactone .  Blood pressure was noted to be labile on presentation.  She is status post 3L paracentesis 4/17 and 5L on 03/21/24, on IV albumin  and diuretics. 4/21 Hb dropped to 6.7, a unit of blood transfused. GI consulted.  Assessment and Plan: Acute decompensation of chronic cirrhosis with ascites. Underlying NASH, Chronic coagulopathy, Chronic hypoalbuminemia Chronic bilirubinemia Significant fluid overload, IV diuresis with 20mg  Lasix  twice, caution with low BP. S/P Paracentesis with 3 L of fluid tapped 03/17/24.  Fluid studies unremarkable for SBP started rocephin .  Repeat US  guided paracentesis with 5L removed 03/21/24. Will give IV Albumin  q 6 x 4 doses. Spironolactone  50 twice daily with hold parameters. Continue to trend LFTs, CT unremarkable for obstruction. Hold off metformin  and statin Mentation at baseline, no signs of encephalopathy, continue lactulose  Long-term prognosis poor, outpatient follow-up with hepatology at [facility]   Acute kidney injury: Mild increase in serum creatinine compared to baseline. Likely ATN from hypotension.   Hold nephrotoxic medications including metformin .  Lasix  dose was decreased to 20 mg, Aldactone  to 50mg  dose.   Hypervolemic Hyponatremia Most likely from Fluid overload secondary to cirrhosis. Also her sugars higher side causing pseudohyponatremia. Sodium stable 128-132. Continue to trend.    Uncontrolled type 2 Diabetes Mellitus:  high blood sugars noted. Discussed with DM educator, her Lantus  dose is 60 units daily and not 6 units. Increased Semglee  to 20 bid. Last A1c was 8.5 in 01/2024. Continue 5 units Novolog  TID with meals. Continue insulin  sliding scale ACHS. Carb consistent diet.   Acute on chronic anemia: H/o esophageal varices. Hb today 6.7. 1 unit PRBC ordered. Discussed with GI, started octreotide , rocephin  therapy. PPI changed to BID. Hold Aspirin , Lovenox . Monitor H&H.   Borderline hypotension Fluid overload. Hypotension secondary to cirrhosis, patient is asymptomatic, check orthostatic vital signs. Home dose Midodrine  resumed. Aldactone  halved with hold parameters. Lasix  dose decreased to 20 bid.   Anxiety/depression- Continue home regimen of SSRI   Obesity class 2- BMI 37.35, likely due to excess calories, ascites fluid Continue to optimize with diuretics as tolerated by blood pressure. Diet, weight reduction advised.   Out of bed to chair. Incentive spirometry. Nursing supportive care. Fall, aspiration precautions. Diet:  Diet Orders (From admission, onward)     Start     Ordered   03/21/24 1407  Diet NPO time specified  Diet effective now        03/21/24 1406           DVT prophylaxis: Lovenox   Level of care: Telemetry Medical   Code Status: Full Code  Subjective: Patient is seen and examined today morning. She reports dark stools 2 days ago. No nausea or abdominal discomfort. Hb dropped to 6.7.  Physical Exam: Vitals:   03/21/24 0958 03/21/24 1004 03/21/24 1202 03/21/24 1226  BP: (!) 88/46 (!) 97/55 112/60 104/63  Pulse: 95  96 96  Resp:   18 18  Temp:   98.6 F (37 C) 98.4 F (  36.9 C)  TempSrc:   Oral Oral  SpO2: 96%  94% 100%  Weight:      Height:        General - Elderly obese Caucasian female, no apparent distress HEENT - PERRLA, EOMI, atraumatic head, non tender sinuses. Lung - Clear, basal rales, rhonchi, no  wheezes. Heart - S1, S2 heard, no murmurs, rubs, 2+ pedal edema. Abdomen - Soft, non tender, distended, bowel sounds good Neuro - Alert, awake and oriented x 3, non focal exam. Skin - Warm and dry.  Data Reviewed:      Latest Ref Rng & Units 03/21/2024    3:47 AM 03/20/2024    4:57 AM 03/17/2024    9:39 AM  CBC  WBC 4.0 - 10.5 K/uL 4.5  3.9  4.8   Hemoglobin 12.0 - 15.0 g/dL 6.7  7.3  7.9   Hematocrit 36.0 - 46.0 % 22.4  23.6  26.4   Platelets 150 - 400 K/uL 117  117  114       Latest Ref Rng & Units 03/21/2024    3:47 AM 03/20/2024    4:57 AM 03/18/2024    5:49 PM  BMP  Glucose 70 - 99 mg/dL 469  629  528   BUN 6 - 20 mg/dL 36  34    Creatinine 4.13 - 1.00 mg/dL 2.44  0.10    Sodium 272 - 145 mmol/L 129  132    Potassium 3.5 - 5.1 mmol/L 4.3  4.0    Chloride 98 - 111 mmol/L 93  93    CO2 22 - 32 mmol/L 27  28    Calcium  8.9 - 10.3 mg/dL 8.8  9.1     US  Paracentesis Result Date: 03/21/2024 INDICATION: 58 year old female with a history of NASH liver cirrhosis who recently underwent paracentesis on 03/17/24 with 3 liters out. EXAM: ULTRASOUND GUIDED THERAPEUTIC PARACENTESIS MEDICATIONS: 8 mL 1% lidocaine  COMPLICATIONS: None immediate. PROCEDURE: Informed written consent was obtained from the patient after a discussion of the risks, benefits and alternatives to treatment. A timeout was performed prior to the initiation of the procedure. Initial ultrasound scanning demonstrates a moderate to large amount of ascites within the left lower abdominal quadrant. The left lower abdomen was prepped and draped in the usual sterile fashion. 1% lidocaine  was used for local anesthesia. Following this, a 19 gauge, 7-cm, Yueh catheter was introduced. An ultrasound image was saved for documentation purposes. The paracentesis was performed. The catheter was removed and a dressing was applied. The patient tolerated the procedure well without immediate post procedural complication. Patient received  post-procedure intravenous albumin ; see nursing notes for details. FINDINGS: A total of approximately 3.6 liters of yellow fluid was removed. IMPRESSION: Successful ultrasound-guided paracentesis yielding 3.6 liters of peritoneal fluid. PLAN: If the patient eventually requires >/=2 paracenteses in a 30 day period, candidacy for formal evaluation by the [facility] will be assessed. Performed by Terressa Fess, NP Electronically Signed   By: Myrlene Asper D.O.   On: 03/21/2024 14:21     Family Communication: Discussed with patient, understand and agree. All questions answered.  Disposition: Status is: Inpatient Remains inpatient appropriate because: low bp, high sugars, PRBC transfusion, GI evaluation  Planned Discharge Destination: Home with Home Health     Time spent: 39 minutes  Author: Aisha Hove, MD 03/21/2024 2:58 PM Secure chat 7am to 7pm For on call review [email or web address].

## 2024-03-22 ENCOUNTER — Telehealth: Payer: Self-pay

## 2024-03-22 ENCOUNTER — Inpatient Hospital Stay: Admitting: Anesthesiology

## 2024-03-22 ENCOUNTER — Encounter
Admission: EM | Disposition: A | Discharge status: Home / Self Care | Payer: Self-pay | Source: Home / Self Care | Attending: Internal Medicine

## 2024-03-22 ENCOUNTER — Encounter: Payer: Self-pay | Admitting: Internal Medicine

## 2024-03-22 DIAGNOSIS — K3189 Other diseases of stomach and duodenum: Secondary | ICD-10-CM | POA: Diagnosis not present

## 2024-03-22 DIAGNOSIS — D649 Anemia, unspecified: Secondary | ICD-10-CM | POA: Diagnosis not present

## 2024-03-22 DIAGNOSIS — R188 Other ascites: Secondary | ICD-10-CM | POA: Diagnosis not present

## 2024-03-22 DIAGNOSIS — E66812 Obesity, class 2: Secondary | ICD-10-CM | POA: Diagnosis not present

## 2024-03-22 DIAGNOSIS — K7460 Unspecified cirrhosis of liver: Secondary | ICD-10-CM | POA: Diagnosis not present

## 2024-03-22 DIAGNOSIS — K921 Melena: Secondary | ICD-10-CM

## 2024-03-22 DIAGNOSIS — I8500 Esophageal varices without bleeding: Secondary | ICD-10-CM | POA: Diagnosis not present

## 2024-03-22 DIAGNOSIS — N179 Acute kidney failure, unspecified: Secondary | ICD-10-CM | POA: Diagnosis not present

## 2024-03-22 HISTORY — PX: ESOPHAGOGASTRODUODENOSCOPY: SHX5428

## 2024-03-22 LAB — CBC
HCT: 26.2 % — ABNORMAL LOW (ref 36.0–46.0)
Hemoglobin: 7.7 g/dL — ABNORMAL LOW (ref 12.0–15.0)
MCH: 22.4 pg — ABNORMAL LOW (ref 26.0–34.0)
MCHC: 29.4 g/dL — ABNORMAL LOW (ref 30.0–36.0)
MCV: 76.4 fL — ABNORMAL LOW (ref 80.0–100.0)
Platelets: 120 10*3/uL — ABNORMAL LOW (ref 150–400)
RBC: 3.43 MIL/uL — ABNORMAL LOW (ref 3.87–5.11)
RDW: 19.3 % — ABNORMAL HIGH (ref 11.5–15.5)
WBC: 4.2 10*3/uL (ref 4.0–10.5)
nRBC: 0 % (ref 0.0–0.2)

## 2024-03-22 LAB — BASIC METABOLIC PANEL WITH GFR
Anion gap: 10 (ref 5–15)
BUN: 31 mg/dL — ABNORMAL HIGH (ref 6–20)
CO2: 26 mmol/L (ref 22–32)
Calcium: 8.9 mg/dL (ref 8.9–10.3)
Chloride: 94 mmol/L — ABNORMAL LOW (ref 98–111)
Creatinine, Ser: 1.25 mg/dL — ABNORMAL HIGH (ref 0.44–1.00)
GFR, Estimated: 50 mL/min — ABNORMAL LOW (ref >60)
Glucose, Bld: 135 mg/dL — ABNORMAL HIGH (ref 70–99)
Potassium: 3.8 mmol/L (ref 3.5–5.1)
Sodium: 130 mmol/L — ABNORMAL LOW (ref 135–145)

## 2024-03-22 LAB — TYPE AND SCREEN
ABO/RH(D): A POS
Antibody Screen: NEGATIVE
Unit division: 0

## 2024-03-22 LAB — HEPATIC FUNCTION PANEL
ALT: 16 U/L (ref 0–44)
AST: 43 U/L — ABNORMAL HIGH (ref 15–41)
Albumin: 2.6 g/dL — ABNORMAL LOW (ref 3.5–5.0)
Alkaline Phosphatase: 232 U/L — ABNORMAL HIGH (ref 38–126)
Bilirubin, Direct: 2.9 mg/dL — ABNORMAL HIGH (ref 0.0–0.2)
Indirect Bilirubin: 2.7 mg/dL — ABNORMAL HIGH (ref 0.3–0.9)
Total Bilirubin: 5.6 mg/dL — ABNORMAL HIGH (ref 0.0–1.2)
Total Protein: 6.5 g/dL (ref 6.5–8.1)

## 2024-03-22 LAB — GLUCOSE, CAPILLARY
Glucose-Capillary: 141 mg/dL — ABNORMAL HIGH (ref 70–99)
Glucose-Capillary: 199 mg/dL — ABNORMAL HIGH (ref 70–99)
Glucose-Capillary: 126 mg/dL — ABNORMAL HIGH (ref 70–99)
Glucose-Capillary: 143 mg/dL — ABNORMAL HIGH (ref 70–99)

## 2024-03-22 LAB — BPAM RBC
Blood Product Expiration Date: 202505092359
ISSUE DATE / TIME: 202504211153
Unit Type and Rh: 6200

## 2024-03-22 SURGERY — EGD (ESOPHAGOGASTRODUODENOSCOPY)
Anesthesia: General

## 2024-03-22 MED ORDER — SODIUM CHLORIDE 0.9 % IV SOLN
INTRAVENOUS | Status: DC
Start: 2024-03-22 — End: 2024-03-22

## 2024-03-22 MED ORDER — GLYCOPYRROLATE 0.2 MG/ML IJ SOLN
INTRAMUSCULAR | Status: DC | PRN
  Administered 2024-03-22: .2 mg via INTRAVENOUS

## 2024-03-22 MED ORDER — FENTANYL CITRATE (PF) 100 MCG/2ML IJ SOLN
INTRAMUSCULAR | Status: DC | PRN
  Administered 2024-03-22: 50 ug via INTRAVENOUS

## 2024-03-22 MED ORDER — PROPOFOL 500 MG/50ML IV EMUL
INTRAVENOUS | Status: DC | PRN
  Administered 2024-03-22: 125 ug/kg/min via INTRAVENOUS

## 2024-03-22 MED ORDER — FUROSEMIDE 20 MG PO TABS
20.0000 mg | ORAL_TABLET | Freq: Every day | ORAL | Status: DC
  Administered 2024-03-22 – 2024-03-23 (×2): 20 mg via ORAL
  Filled 2024-03-22 (×2): qty 1

## 2024-03-22 MED ORDER — FENTANYL CITRATE (PF) 100 MCG/2ML IJ SOLN
INTRAMUSCULAR | Status: AC
  Filled 2024-03-22: qty 2

## 2024-03-22 MED ORDER — PROPOFOL 10 MG/ML IV BOLUS
INTRAVENOUS | Status: DC | PRN
Start: 2024-03-22 — End: 2024-03-22
  Administered 2024-03-22: 100 mg via INTRAVENOUS

## 2024-03-22 MED ORDER — INSULIN ASPART 100 UNIT/ML IJ SOLN
8.0000 [IU] | Freq: Three times a day (TID) | INTRAMUSCULAR | Status: DC
  Administered 2024-03-22 – 2024-03-23 (×3): 8 [IU] via SUBCUTANEOUS
  Filled 2024-03-22 (×3): qty 1

## 2024-03-22 MED ORDER — LIDOCAINE HCL (CARDIAC) PF 100 MG/5ML IV SOSY
PREFILLED_SYRINGE | INTRAVENOUS | Status: DC | PRN
  Administered 2024-03-22: 100 mg via INTRAVENOUS

## 2024-03-22 NOTE — Consult Note (Signed)
 [name], MD [facility]  [address], [address] [place], [place] [address] Phone: [phone] Fax : [phone]  Consultation  Referring Provider:     Dr. Butch Cashing Primary Care Physician:  [facility] [place] [facility] Primary Gastroenterologist: [facility]         Reason for Consultation:     Melena  Date of Admission:  03/17/2024 Date of Consultation:  03/22/2024         HPI:   Abigail Molina is a 58 y.o. 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  The patient has a history of cirrhosis and esophageal varices that were reported to be grade 2 in February 2024.  The patient also had a colonoscopy in 2022.  The patient has a history of diabetes and an NSTEMI who presented with abdominal distention and edema.  The patient also reports that her pain is in the right and left lower abdomen.  She has endorsed some black stools prior to coming in to the hospital.  She states that she came here because this hospital is closer to her but does not seek outside care here but at [facility].  The patient had some blood work done that showed her hemoglobin to have gone down yesterday and she was transfused 1 unit of blood.  The recent trends have shown:  Component     Latest Ref Rng 02/08/2024 03/17/2024 03/20/2024 03/21/2024 03/22/2024  Hemoglobin     12.0 - 15.0 g/dL 8.4 (L)  7.9 (L)  7.3 (L)  6.7 (L)  7.7 (L)   HCT     36.0 - 46.0 % 26.9 (L)  26.4 (L)  23.6 (L)  22.4 (L)  26.2 (L)    The patient denies having any further black stools while she has been here.  She also denies any hematemesis.  The patient does take iron supplementation at home.  She has also been on diuretics for her ascites.  The patient has a recent admission to the hospital for septic shock due to Klebsiella.  She had over 3 L of ascitic fluid removed recently followed by 5 L a few days later.  The fluid showed a cell count of 261 with neutrophils only being 2.  Patient reports that her abdominal pain started to be more noticeable after having  her paracentesis.  Past Medical History:  Diagnosis Date   Anemia    Anxiety    Arthritis    Cancer (HCC)    Endometrial cancer   Chronic pain    Cirrhosis of liver (HCC)    Depression    Diabetes mellitus without complication (HCC)    Fatty liver disease, nonalcoholic 2021   GERD (gastroesophageal reflux disease)    Glaucoma    Hyperlipidemia    Hypertension    Meningioma (HCC)    NSTEMI (non-ST elevated myocardial infarction) (HCC) 02/03/2024   OSA (obstructive sleep apnea) 05/08/2023   Overactive bladder    Pharmacologic therapy 11/02/2023   Psoriasis 2015    Past Surgical History:  Procedure Laterality Date   ABDOMINAL HYSTERECTOMY  2007   CHOLECYSTECTOMY  2000    Prior to Admission medications   Medication Sig Start Date End Date Taking? Authorizing Provider  aspirin  81 MG tablet Take 81 mg by mouth daily.   Yes [provider]  Baclofen  5 MG TABS Take 2 tablets by mouth at bedtime.   Yes [provider]  buPROPion  (WELLBUTRIN  XL) 150 MG 24 hr tablet Take 1  tablet (150 mg total) by mouth daily. 11/16/23 03/17/24 Yes Hisada, Ivan Marion, MD  canagliflozin (INVOKANA) 300 MG TABS tablet Take 300 mg by mouth daily before breakfast.   Yes [provider]  citalopram  (CELEXA ) 20 MG tablet Take 20 mg by mouth daily.   Yes [provider]  COSENTYX SENSOREADY, 300 MG, 150 MG/ML SOAJ Inject into the skin.   Yes [provider]  Ferrous Sulfate  (SLOW FE) 142 (45 Fe) MG TBCR Take by mouth. 09/12/21  Yes [provider]  furosemide  (LASIX ) 40 MG tablet Take 40 mg by mouth 2 (two) times daily. 09/25/23  Yes [provider]  gabapentin  (NEURONTIN ) 300 MG capsule Take by mouth once. 06/02/23 06/01/24 Yes [provider]  insulin  lispro (HUMALOG) 100 UNIT/ML injection Inject 8 Units into the skin 3 (three) times daily with meals.   Yes [provider]  lactulose  (CHRONULAC ) 10 GM/15ML solution SMARTSIG:Milliliter(s) By  Mouth 06/19/22  Yes [provider]  LANTUS  SOLOSTAR 100 UNIT/ML Solostar Pen Inject 6 Units into the skin daily. Patient taking differently: Inject 60 Units into the skin daily. 02/08/24 04/10/25 Yes Donaciano Frizzle, MD  MAGNESIUM -OXIDE 400 (240 Mg) MG tablet Take 2 tablets by mouth 2 (two) times daily. 10/14/23  Yes [provider]  metFORMIN  (GLUCOPHAGE -XR) 500 MG 24 hr tablet Take 1,000 mg by mouth 2 (two) times daily.   Yes [provider]  mirtazapine  (REMERON ) 15 MG tablet Take 1 tablet (15 mg total) by mouth at bedtime. 02/19/24  Yes Hisada, Ivan Marion, MD  naltrexone (DEPADE) 50 MG tablet Take 50 mg by mouth daily.   Yes [provider]  ondansetron  (ZOFRAN -ODT) 4 MG disintegrating tablet Dissolve 1 tablet (4 mg total) in the mouth every twelve (12) hours as needed for nausea. 10/05/23 10/04/24 Yes [provider]  pantoprazole  (PROTONIX ) 40 MG tablet TAKE ONE TABLET BY MOUTH EVERY DAY 10/24/19  Yes McGowan, Cathleen Coach A, PA-C  promethazine (PHENERGAN) 12.5 MG tablet Take by mouth. 05/23/22  Yes [provider]  rosuvastatin  (CRESTOR ) 20 MG tablet Take 1 tablet (20 mg total) by mouth daily. 02/09/24  Yes Donaciano Frizzle, MD  spironolactone  (ALDACTONE ) 100 MG tablet Take 100 mg by mouth in the morning and at bedtime.   Yes [provider]  XIFAXAN  550 MG TABS tablet Take 550 mg by mouth 2 (two) times daily.   Yes [provider]  zolpidem  (AMBIEN ) 5 MG tablet Take 5 mg by mouth at bedtime as needed. 03/02/24  Yes [provider]  Accu-Chek Softclix Lancets lancets SMARTSIG:Topical    [provider]  BD PEN NEEDLE NANO 2ND GEN 32G X 4 MM MISC USE 1 NIGHTLY 09/28/23   [provider]  Continuous Glucose Sensor (FREESTYLE LIBRE 3 SENSOR) MISC by Does not apply route daily.    [provider]  Insulin  Pen Needle 32G X 6 MM MISC 1 Syringe by Does not apply route. Use with Victoza .    [provider]   levETIRAcetam  (KEPPRA ) 500 MG tablet Take 750 mg by mouth 2 (two) times daily. 01/04/24 07/02/24  [provider]  midodrine  (PROAMATINE ) 10 MG tablet Take 0.5 tablets (5 mg total) by mouth 3 (three) times daily with meals. Patient not taking: Reported on 03/17/2024 02/08/24   Donaciano Frizzle, MD  Multiple Vitamin (MULTIVITAMIN WITH MINERALS) TABS tablet Take 1 tablet by mouth daily. Patient not taking: Reported on 03/17/2024 02/09/24   Donaciano Frizzle, MD  Sharps Container (BD SHARPS COLLECTOR) MISC Use  as directed to dispose of Cosentyx pens. 10/05/23   [provider]  thiamine  (VITAMIN B1) 100 MG tablet Take 1 tablet (100 mg total) by mouth daily. Patient not taking: Reported on 03/17/2024 02/09/24   Donaciano Frizzle, MD  zinc  sulfate, 50mg  elemental zinc , 220 (50 Zn) MG capsule Take 1 capsule (220 mg total) by mouth daily. Patient not taking: Reported on 03/17/2024 02/09/24   Donaciano Frizzle, MD    Family History  Problem Relation Age of Onset   Diabetes Mother    Hypertension Mother    Depression Mother    Obesity Mother    Vision loss Mother    Alcohol  abuse Cousin    Bipolar disorder Cousin    Heart disease Maternal Grandfather    Alcohol  abuse Maternal Grandmother    Stroke Paternal Grandfather      Social History   Tobacco Use   Smoking status: Never   Smokeless tobacco: Never  Vaping Use   Vaping status: Never Used  Substance Use Topics   Alcohol  use: Not Currently    Comment:  Rarely. One or two drinks a year    Drug use: No    Allergies as of 03/17/2024   (No Known Allergies)    Review of Systems:    All systems reviewed and negative except where noted in HPI.   Physical Exam:  Vital signs in last 24 hours: Temp:  [98.2 F (36.8 C)-98.8 F (37.1 C)] 98.2 F (36.8 C) (04/22 0800) Pulse Rate:  [81-96] 92 (04/22 0800) Resp:  [17-18] 18 (04/22 0800) BP: (88-112)/(46-68) 102/66 (04/22 0800) SpO2:  [93 %-100 %] 97 % (04/22 0800) Last BM Date :  03/19/24 General:   Pleasant, cooperative in NAD Head:  Normocephalic and atraumatic. Eyes:   No icterus.   Conjunctiva pink. PERRLA. Ears:  Normal auditory acuity. Neck:  Supple; no masses or thyroidomegaly Lungs: Respirations even and unlabored. Lungs clear to auscultation bilaterally.   No wheezes, crackles, or rhonchi.  Heart:  Regular rate and rhythm;  Without murmur, clicks, rubs or gallops Abdomen:  Soft, positive distention, nontender. Normal bowel sounds. No appreciable masses or hepatomegaly.  No rebound or guarding.  Rectal:  Not performed. Msk:  Symmetrical without gross deformities.    Extremities:  Without edema, cyanosis or clubbing. Neurologic:  Alert and oriented x3;  grossly normal neurologically. Skin:  Intact without significant lesions or rashes. Cervical Nodes:  No significant cervical adenopathy. Psych:  Alert and cooperative. Normal affect.  LAB RESULTS: Recent Labs    03/20/24 0457 03/21/24 0347 03/22/24 0449  WBC 3.9* 4.5 4.2  HGB 7.3* 6.7* 7.7*  HCT 23.6* 22.4* 26.2*  PLT 117* 117* 120*   BMET Recent Labs    03/20/24 0457 03/21/24 0347 03/22/24 0449  NA 132* 129* 130*  K 4.0 4.3 3.8  CL 93* 93* 94*  CO2 28 27 26   GLUCOSE 284* 359* 135*  BUN 34* 36* 31*  CREATININE 1.47* 1.22* 1.25*  CALCIUM  9.1 8.8* 8.9   LFT No results for input(s): "PROT", "ALBUMIN ", "AST", "ALT", "ALKPHOS", "BILITOT", "BILIDIR", "IBILI" in the last 72 hours. PT/INR No results for input(s): "LABPROT", "INR" in the last 72 hours.  STUDIES: US  Paracentesis Result Date: 03/21/2024 INDICATION: 58 year old female with a history of NASH liver cirrhosis who recently underwent paracentesis on 03/17/24 with 3 liters out. EXAM: ULTRASOUND GUIDED THERAPEUTIC PARACENTESIS MEDICATIONS: 8 mL 1% lidocaine  COMPLICATIONS: None immediate. PROCEDURE: Informed written consent was obtained from the patient after a discussion of the  risks, benefits and alternatives to treatment. A timeout was  performed prior to the initiation of the procedure. Initial ultrasound scanning demonstrates a moderate to large amount of ascites within the left lower abdominal quadrant. The left lower abdomen was prepped and draped in the usual sterile fashion. 1% lidocaine  was used for local anesthesia. Following this, a 19 gauge, 7-cm, Yueh catheter was introduced. An ultrasound image was saved for documentation purposes. The paracentesis was performed. The catheter was removed and a dressing was applied. The patient tolerated the procedure well without immediate post procedural complication. Patient received post-procedure intravenous albumin ; see nursing notes for details. FINDINGS: A total of approximately 3.6 liters of yellow fluid was removed. IMPRESSION: Successful ultrasound-guided paracentesis yielding 3.6 liters of peritoneal fluid. PLAN: If the patient eventually requires >/=2 paracenteses in a 30 day period, candidacy for formal evaluation by the [facility] Interventional Radiology Portal Hypertension Clinic will be assessed. Performed by Terressa Fess, NP Electronically Signed   By: Myrlene Asper D.O.   On: 03/21/2024 14:21      Impression / Plan:   Assessment: Principal Problem:   Cirrhosis (HCC) Active Problems:   Hypertension   Esophageal varices in cirrhosis (HCC)   H/O diabetic gastroparesis   Portal hypertension (HCC)   Type 2 diabetes mellitus, with long-term current use of insulin  (HCC)   AKI (acute kidney injury) (HCC)   Hyponatremia   Acute on chronic anemia   Obesity (BMI 30-39.9)   Abigail Molina is a 58 y.o. y/o female with a history of cirrhosis and dark stools with fluid overload and status post paracentesis without any sign of SBP.  The patient had dropped her hemoglobin and had a transfusion of blood yesterday.  She has not had any recent black stools but did have black stools before she came in.  Plan:  The patient has a history of varices and will be set up for an EGD  for today.  If the patient has varices with any sign of recent bleeding then the patient will have banding of the esophageal varices.  The patient has been started on antibiotics for her GI bleeding with cirrhosis and has been started on octreotide .  The patient has been explained the plan and agrees with it.  The patient will follow up with her primary gastroenterologist at [facility] upon discharge.  Thank you for involving me in the care of this patient.      LOS: 5 days   [name], MD, MD. Sylvan Evener 03/22/2024, 9:23 AM,  Pager [phone] 7am-5pm  Check AMION for 5pm -7am coverage and on weekends   Note: This dictation was prepared with Dragon dictation along with smaller phrase technology. Any transcriptional errors that result from this process are unintentional.

## 2024-03-22 NOTE — Progress Notes (Signed)
 Progress Note   Patient: Abigail Molina:[id number] DOB: [birth year] DOA: 03/17/2024     5 DOS: the patient was seen and examined on 03/22/2024   Brief hospital course: Abigail Molina is a 58 y.o. female with medical history significant of NASH and liver cirrhosis, recent Klebsiella bacteremia and septic shock, HTN, HLD, IIDM, morbid obesity, presented with increasing shortness of breath and increased fluid overload.  Symptoms consistent with acute cirrhosis flare.  Patient reports compliance with her diuretic that include Lasix  40 mg twice daily and spironolactone .  Blood pressure was noted to be labile on presentation.  She is status post 3L paracentesis 4/17 and 5L on 03/21/24, on IV albumin  and diuretics. 4/21 Hb dropped to 6.7, a unit of blood transfused. GI consulted.  Assessment and Plan: Acute decompensation of chronic cirrhosis with ascites. Underlying NASH, Chronic coagulopathy, Chronic hypoalbuminemia Chronic bilirubinemia Significant fluid overload, IV diuresis with 20mg  Lasix  twice, caution with low BP. S/P Paracentesis with 3 L of fluid tapped 03/17/24.  Fluid studies unremarkable for SBP started rocephin .  Repeat US  guided paracentesis with 5L removed 03/21/24. S/p IV Albumin  q 6 x 4 doses. Spironolactone  50 twice daily with hold parameters. Add on LFTs, CT unremarkable for obstruction. Hold off metformin  and statin Mentation at baseline, no signs of encephalopathy, continue lactulose  Long-term prognosis poor, outpatient follow-up with hepatology at [facility]   Acute kidney injury: Mild increase in serum creatinine compared to baseline. Likely ATN from hypotension.   Hold nephrotoxic medications including metformin .  Lasix  dose was decreased to 20 mg, Aldactone  to 50mg  dose.   Hypervolemic Hyponatremia Most likely from Fluid overload secondary to cirrhosis. Also her sugars higher side causing pseudohyponatremia. Sodium stable 128-132. Continue to trend.   Uncontrolled type 2 Diabetes  Mellitus:  blood sugars better. Continue Semglee  to 20 bid. Last A1c was 8.5 in 01/2024. Novolog  increased to 8 u TID with meals. Continue insulin  sliding scale ACHS. Carb consistent diet.   Acute on chronic anemia: H/o esophageal varices. Hb 6.7. s/p 1 unit PRBC, today hb 7.7 S/p EGD - Grade II esophageal varices. Incompletely eradicated. Banded. Portal hypertensive gastropathy. Normal examined duodenum. Continue octreotide , rocephin  therapy, PPI BID. Hold Aspirin , Lovenox . Monitor H&H.   Borderline hypotension Fluid overload. Hypotension secondary to cirrhosis, patient is asymptomatic, check orthostatic vital signs. Home dose Midodrine  resumed. Aldactone  halved with hold parameters. Lasix  dose decreased to 20 bid.   Anxiety/depression- Continue home regimen of SSRI   Obesity class 2- BMI 37.35, likely due to excess calories, ascites fluid Continue to optimize with diuretics as tolerated by blood pressure. Diet, weight reduction advised.   Out of bed to chair. Incentive spirometry. Nursing supportive care. Fall, aspiration precautions. Diet:  Diet Orders (From admission, onward)     Start     Ordered   03/22/24 1448  DIET SOFT Fluid consistency: Thin  Diet effective now       Question:  Fluid consistency:  Answer:  Thin   03/22/24 1447           DVT prophylaxis: Lovenox   Level of care: Telemetry Medical   Code Status: Full Code  Subjective: Patient is seen and examined today after EGD. She is able to tolerate clears. No abdominal pain. No active bleeding.  Physical Exam: Vitals:   03/22/24 0800 03/22/24 1125 03/22/24 1221 03/22/24 1307  BP: 102/66 (!) 105/53 118/68 96/63  Pulse: 92 91  99  Resp: 18 20  15   Temp: 98.2 F (36.8 C) 98.7 F (  37.1 C) 97.7 F (36.5 C) 97.9 F (36.6 C)  TempSrc: Oral Temporal Temporal Oral  SpO2: 97% 97% 96% 96%  Weight:      Height:        General - Elderly obese Caucasian female, no apparent distress HEENT - PERRLA, EOMI,  atraumatic head, non tender sinuses. Lung - Clear, basal rales, rhonchi, no wheezes. Heart - S1, S2 heard, no murmurs, rubs, 2+ pedal edema. Abdomen - Soft, non tender, distended, bowel sounds good Neuro - Alert, awake and oriented x 3, non focal exam. Skin - Warm and dry.  Data Reviewed:      Latest Ref Rng & Units 03/22/2024    4:49 AM 03/21/2024    3:47 AM 03/20/2024    4:57 AM  CBC  WBC 4.0 - 10.5 K/uL 4.2  4.5  3.9   Hemoglobin 12.0 - 15.0 g/dL 7.7  6.7  7.3   Hematocrit 36.0 - 46.0 % 26.2  22.4  23.6   Platelets 150 - 400 K/uL 120  117  117       Latest Ref Rng & Units 03/22/2024    4:49 AM 03/21/2024    3:47 AM 03/20/2024    4:57 AM  BMP  Glucose 70 - 99 mg/dL 130  865  784   BUN 6 - 20 mg/dL 31  36  34   Creatinine 0.44 - 1.00 mg/dL 6.96  2.95  2.84   Sodium 135 - 145 mmol/L 130  129  132   Potassium 3.5 - 5.1 mmol/L 3.8  4.3  4.0   Chloride 98 - 111 mmol/L 94  93  93   CO2 22 - 32 mmol/L 26  27  28    Calcium  8.9 - 10.3 mg/dL 8.9  8.8  9.1    US  Paracentesis Result Date: 03/21/2024 INDICATION: 58 year old female with a history of NASH liver cirrhosis who recently underwent paracentesis on 03/17/24 with 3 liters out. EXAM: ULTRASOUND GUIDED THERAPEUTIC PARACENTESIS MEDICATIONS: 8 mL 1% lidocaine  COMPLICATIONS: None immediate. PROCEDURE: Informed written consent was obtained from the patient after a discussion of the risks, benefits and alternatives to treatment. A timeout was performed prior to the initiation of the procedure. Initial ultrasound scanning demonstrates a moderate to large amount of ascites within the left lower abdominal quadrant. The left lower abdomen was prepped and draped in the usual sterile fashion. 1% lidocaine  was used for local anesthesia. Following this, a 19 gauge, 7-cm, Yueh catheter was introduced. An ultrasound image was saved for documentation purposes. The paracentesis was performed. The catheter was removed and a dressing was applied. The patient  tolerated the procedure well without immediate post procedural complication. Patient received post-procedure intravenous albumin ; see nursing notes for details. FINDINGS: A total of approximately 3.6 liters of yellow fluid was removed. IMPRESSION: Successful ultrasound-guided paracentesis yielding 3.6 liters of peritoneal fluid. PLAN: If the patient eventually requires >/=2 paracenteses in a 30 day period, candidacy for formal evaluation by the [facility] Interventional Radiology Portal Hypertension Clinic will be assessed. Performed by Terressa Fess, NP Electronically Signed   By: Myrlene Asper D.O.   On: 03/21/2024 14:21     Family Communication: Discussed with patient, understand and agree. All questions answered.  Disposition: Status is: Inpatient Remains inpatient appropriate because: low bp, high sugars, PRBC transfusion, GI evaluation  Planned Discharge Destination: Home with Home Health     Time spent: 38 minutes  Author: Aisha Hove, MD 03/22/2024 2:47 PM Secure chat 7am to 7pm For on  call review [email or web address].

## 2024-03-22 NOTE — Telephone Encounter (Signed)
 4 week repeat EGD

## 2024-03-22 NOTE — Anesthesia Preprocedure Evaluation (Addendum)
 Anesthesia Evaluation  Patient identified by MRN, date of birth, ID band Patient awake    Reviewed: Allergy & Precautions, NPO status , Patient's Chart, lab work & pertinent test results  History of Anesthesia Complications Negative for: history of anesthetic complications  Airway Mallampati: IV   Neck ROM: Full    Dental  (+) Missing   Pulmonary sleep apnea    Pulmonary exam normal breath sounds clear to auscultation       Cardiovascular hypertension, + CAD (s/p MI)  Normal cardiovascular exam Rhythm:Regular Rate:Normal  ECG 03/18/24: Sinus rhythm Multiple premature complexes, vent & supraven Inferior infarct, old Prolonged QT interval Baseline wander in lead(s) V6  Echo 09/24/22:    1. Technically difficult study.    2. The left ventricle is normal in size with upper normal wall thickness.    3. The left ventricular systolic function is normal, LVEF is visually estimated at > 55%.    4. The left atrium is mildly to moderately dilated in size.    5. The right ventricle is normal in size, with normal systolic function.   Neuro/Psych  Headaches, Seizures - (last sz few weeks ago),  PSYCHIATRIC DISORDERS Anxiety Depression    Chronic pain  Neuromuscular disease (diabetic polyneuropathy)    GI/Hepatic ,GERD  ,,(+) Cirrhosis   Esophageal Varices and ascites    NAFLD; NASH   Endo/Other  diabetes, Type 2  Obesity   Renal/GU Renal disease (AKI)     Musculoskeletal  (+) Arthritis ,    Abdominal   Peds  Hematology   Anesthesia Other Findings   Reproductive/Obstetrics Endometrial CA                             Anesthesia Physical Anesthesia Plan  ASA: 4  Anesthesia Plan: General   Post-op Pain Management:    Induction: Intravenous  PONV Risk Score and Plan: 3 and Propofol  infusion, TIVA and Treatment may vary due to age or medical condition  Airway Management Planned: Natural  Airway  Additional Equipment:   Intra-op Plan:   Post-operative Plan:   Informed Consent: I have reviewed the patients History and Physical, chart, labs and discussed the procedure including the risks, benefits and alternatives for the proposed anesthesia with the patient or authorized representative who has indicated his/her understanding and acceptance.       Plan Discussed with: CRNA  Anesthesia Plan Comments: (LMA/GETA backup discussed.  Patient consented for risks of anesthesia including but not limited to:  - adverse reactions to medications - damage to eyes, teeth, lips or other oral mucosa - nerve damage due to positioning  - sore throat or hoarseness - damage to heart, brain, nerves, lungs, other parts of body or loss of life  Informed patient about role of CRNA in peri- and intra-operative care.  Patient voiced understanding.)        Anesthesia Quick Evaluation

## 2024-03-22 NOTE — Inpatient Diabetes Management (Signed)
 Inpatient Diabetes Program Recommendations  AACE/ADA: New Consensus Statement on Inpatient Glycemic Control   Target Ranges:  Prepandial:   less than 140 mg/dL      Peak postprandial:   less than 180 mg/dL (1-2 hours)      Critically ill patients:  140 - 180 mg/dL    Latest Reference Range & Units 03/21/24 07:53 03/21/24 11:47 03/21/24 16:37 03/21/24 20:40 03/22/24 07:59  Glucose-Capillary 70 - 99 mg/dL 161 (H) 096 (H) 045 (H) 203 (H) 141 (H)   Review of Glycemic Control  Diabetes history: DM2 Outpatient Diabetes medications: Lantus  60 units daily, Humalog 8 units TID with meals, Metformin  500 mg BID, Invokana 300 mg daily Current orders for Inpatient glycemic control: Semglee  20 units BID, Novolog  0-20 units TID with meals, Novolog  5 units TID with meals   Inpatient Diabetes Program Recommendations:     Insulin : Once diet resumed, please consider increasing meal coverage to Novolog  8 units TID with meals.  Thanks, Beacher Limerick, RN, MSN, CDCES Diabetes Coordinator Inpatient Diabetes Program [phone] (Team Pager from 8am to 5pm)

## 2024-03-22 NOTE — Plan of Care (Signed)

## 2024-03-22 NOTE — Transfer of Care (Signed)
 Immediate Anesthesia Transfer of Care Note  Patient: Abigail Molina  Procedure(s) Performed: EGD (ESOPHAGOGASTRODUODENOSCOPY)  Patient Location: PACU  Anesthesia Type:General  Level of Consciousness: awake, alert , oriented, and patient cooperative  Airway & Oxygen Therapy: Patient Spontanous Breathing and Patient connected to nasal cannula oxygen  Post-op Assessment: Report given to RN and Post -op Vital signs reviewed and stable  Post vital signs: Reviewed and stable  Last Vitals:  Vitals Value Taken Time  BP 118/68 03/22/24 1222  Temp 36.5 C 03/22/24 1221  Pulse 103 03/22/24 1222  Resp 11 03/22/24 1222  SpO2 96 % 03/22/24 1222  Vitals shown include unfiled device data.  Last Pain:  Vitals:   03/22/24 1221  TempSrc: Temporal  PainSc: 0-No pain     Patent airway to PACU, breathing spontaneously on 4L O2 via . Pt awake and responsive. Pt states pain (7/10) from the banding. Will give Fentanyl  (see MAR) in PACU to alleviate the discomfort, as per pt and PACU RN request.     Patients Stated Pain Goal: 0 (03/17/24 1713)  Complications: No notable events documented.

## 2024-03-22 NOTE — Op Note (Signed)
 [facility] Gastroenterology Patient Name: Abigail Molina Procedure Date: 03/22/2024 12:02 PM MRN: [id number] Account #: [id number] Date of Birth: January 30, 1966 Admit Type: Inpatient Age: 58 Room: [facility] ENDO ROOM 4 Gender: Female Note Status: Finalized Instrument Name: Upper Endoscope [id number] Procedure:             Upper GI endoscopy Indications:           Melena Providers:             [name] MD, MD Referring MD:          [facility] (Referring MD) Medicines:             Propofol  per Anesthesia Complications:         No immediate complications. Procedure:             Pre-Anesthesia Assessment:                        - Prior to the procedure, a History and Physical was                         performed, and patient medications and allergies were                         reviewed. The patient's tolerance of previous                         anesthesia was also reviewed. The risks and benefits                         of the procedure and the sedation options and risks                         were discussed with the patient. All questions were                         answered, and informed consent was obtained. Prior                         Anticoagulants: The patient has taken no anticoagulant                         or antiplatelet agents. ASA Grade Assessment: III - A                         patient with severe systemic disease. After reviewing                         the risks and benefits, the patient was deemed in                         satisfactory condition to undergo the procedure.                        After obtaining informed consent, the endoscope was                         passed under direct vision. Throughout the procedure,  the patient's blood pressure, pulse, and oxygen                         saturations were monitored continuously. The Endoscope                         was introduced through the mouth, and advanced to  the                         second part of duodenum. The upper GI endoscopy was                         accomplished without difficulty. The patient tolerated                         the procedure well. Findings:      Grade II varices were found in the lower third of the esophagus. Four       bands were successfully placed with incomplete eradication of varices.       There was no bleeding during the procedure.      Moderate portal hypertensive gastropathy was found in the entire       examined stomach.      The examined duodenum was normal. Impression:            - Grade II esophageal varices. Incompletely                         eradicated. Banded.                        - Portal hypertensive gastropathy.                        - Normal examined duodenum.                        - No specimens collected. Recommendation:        - Return patient to hospital ward for ongoing care.                        - Clear liquid diet.                        - Continue present medications.                        - Repeat upper endoscopy in 4 weeks for retreatment. Procedure Code(s):     --- Professional ---                        [phone], Esophagogastroduodenoscopy, flexible,                         transoral; with band ligation of esophageal/gastric                         varices Diagnosis Code(s):     --- Professional ---                        K92.1, Melena (includes Hematochezia)  I85.00, Esophageal varices without bleeding CPT copyright 2022 American Medical Association. All rights reserved. The codes documented in this report are preliminary and upon coder review may  be revised to meet current compliance requirements. [name] MD, MD 03/22/2024 12:16:36 PM This report has been signed electronically. Number of Addenda: 0 Note Initiated On: 03/22/2024 12:02 PM Estimated Blood Loss:  Estimated blood loss: none.      [facility]

## 2024-03-23 ENCOUNTER — Other Ambulatory Visit: Payer: Self-pay

## 2024-03-23 DIAGNOSIS — R188 Other ascites: Principal | ICD-10-CM

## 2024-03-23 DIAGNOSIS — I10 Essential (primary) hypertension: Secondary | ICD-10-CM

## 2024-03-23 DIAGNOSIS — Z8639 Personal history of other endocrine, nutritional and metabolic disease: Secondary | ICD-10-CM | POA: Diagnosis not present

## 2024-03-23 DIAGNOSIS — K766 Portal hypertension: Secondary | ICD-10-CM

## 2024-03-23 DIAGNOSIS — E1165 Type 2 diabetes mellitus with hyperglycemia: Secondary | ICD-10-CM

## 2024-03-23 DIAGNOSIS — K7460 Unspecified cirrhosis of liver: Secondary | ICD-10-CM | POA: Diagnosis not present

## 2024-03-23 DIAGNOSIS — E669 Obesity, unspecified: Secondary | ICD-10-CM

## 2024-03-23 DIAGNOSIS — I8511 Secondary esophageal varices with bleeding: Secondary | ICD-10-CM

## 2024-03-23 DIAGNOSIS — I8500 Esophageal varices without bleeding: Secondary | ICD-10-CM | POA: Diagnosis not present

## 2024-03-23 DIAGNOSIS — N179 Acute kidney failure, unspecified: Secondary | ICD-10-CM | POA: Diagnosis not present

## 2024-03-23 DIAGNOSIS — Z794 Long term (current) use of insulin: Secondary | ICD-10-CM

## 2024-03-23 LAB — CBC
HCT: 28.9 % — ABNORMAL LOW (ref 36.0–46.0)
Hemoglobin: 8.6 g/dL — ABNORMAL LOW (ref 12.0–15.0)
MCH: 22.9 pg — ABNORMAL LOW (ref 26.0–34.0)
MCHC: 29.8 g/dL — ABNORMAL LOW (ref 30.0–36.0)
MCV: 76.9 fL — ABNORMAL LOW (ref 80.0–100.0)
Platelets: 142 10*3/uL — ABNORMAL LOW (ref 150–400)
RBC: 3.76 MIL/uL — ABNORMAL LOW (ref 3.87–5.11)
RDW: 20.4 % — ABNORMAL HIGH (ref 11.5–15.5)
WBC: 5.8 10*3/uL (ref 4.0–10.5)
nRBC: 0 % (ref 0.0–0.2)

## 2024-03-23 LAB — COMPREHENSIVE METABOLIC PANEL WITH GFR
ALT: 17 U/L (ref 0–44)
AST: 51 U/L — ABNORMAL HIGH (ref 15–41)
Albumin: 2.8 g/dL — ABNORMAL LOW (ref 3.5–5.0)
Alkaline Phosphatase: 252 U/L — ABNORMAL HIGH (ref 38–126)
Anion gap: 8 (ref 5–15)
BUN: 24 mg/dL — ABNORMAL HIGH (ref 6–20)
CO2: 26 mmol/L (ref 22–32)
Calcium: 9 mg/dL (ref 8.9–10.3)
Chloride: 97 mmol/L — ABNORMAL LOW (ref 98–111)
Creatinine, Ser: 1.17 mg/dL — ABNORMAL HIGH (ref 0.44–1.00)
GFR, Estimated: 54 mL/min — ABNORMAL LOW (ref >60)
Glucose, Bld: 194 mg/dL — ABNORMAL HIGH (ref 70–99)
Potassium: 4.3 mmol/L (ref 3.5–5.1)
Sodium: 131 mmol/L — ABNORMAL LOW (ref 135–145)
Total Bilirubin: 7.2 mg/dL — ABNORMAL HIGH (ref 0.0–1.2)
Total Protein: 6.9 g/dL (ref 6.5–8.1)

## 2024-03-23 LAB — GLUCOSE, CAPILLARY
Glucose-Capillary: 184 mg/dL — ABNORMAL HIGH (ref 70–99)
Glucose-Capillary: 200 mg/dL — ABNORMAL HIGH (ref 70–99)

## 2024-03-23 MED ORDER — FUROSEMIDE 20 MG PO TABS: 20.0000 mg | ORAL_TABLET | Freq: Two times a day (BID) | ORAL | Status: DC

## 2024-03-23 MED ORDER — ADULT MULTIVITAMIN W/MINERALS CH: 1.0000 | ORAL_TABLET | Freq: Every day | ORAL | 0 refills | Status: DC

## 2024-03-23 MED ORDER — ZINC SULFATE 220 (50 ZN) MG PO TABS
220.0000 mg | ORAL_TABLET | Freq: Every day | ORAL | 0 refills | Status: DC
  Filled 2024-03-23: qty 30, 30d supply, fill #0

## 2024-03-23 MED ORDER — FUROSEMIDE 20 MG PO TABS
20.0000 mg | ORAL_TABLET | Freq: Two times a day (BID) | ORAL | 0 refills | Status: DC
  Filled 2024-03-23: qty 180, 90d supply, fill #0

## 2024-03-23 MED ORDER — SPIRONOLACTONE 50 MG PO TABS
50.0000 mg | ORAL_TABLET | Freq: Two times a day (BID) | ORAL | 0 refills | Status: DC
  Filled 2024-03-23: qty 180, 90d supply, fill #0

## 2024-03-23 MED ORDER — PANTOPRAZOLE SODIUM 40 MG PO TBEC
40.0000 mg | DELAYED_RELEASE_TABLET | Freq: Two times a day (BID) | ORAL | Status: DC
Start: 2024-03-23 — End: 2024-03-23
  Administered 2024-03-23: 40 mg via ORAL
  Filled 2024-03-23: qty 1

## 2024-03-23 MED ORDER — LACTULOSE 10 GM/15ML PO SOLN
30.0000 g | Freq: Two times a day (BID) | ORAL | 2 refills | Status: DC | PRN
  Filled 2024-03-23: qty 236, 3d supply, fill #0

## 2024-03-23 MED ORDER — VITAMIN B-1 100 MG PO TABS
100.0000 mg | ORAL_TABLET | Freq: Every day | ORAL | 0 refills | Status: DC
  Filled 2024-03-23: qty 90, 90d supply, fill #0

## 2024-03-23 MED ORDER — LANTUS SOLOSTAR 100 UNIT/ML ~~LOC~~ SOPN: 30.0000 [IU] | PEN_INJECTOR | Freq: Every day | SUBCUTANEOUS | Status: DC

## 2024-03-23 MED ORDER — MIDODRINE HCL 10 MG PO TABS
10.0000 mg | ORAL_TABLET | Freq: Three times a day (TID) | ORAL | 1 refills | Status: DC
  Filled 2024-03-23: qty 90, 30d supply, fill #0

## 2024-03-23 NOTE — Anesthesia Postprocedure Evaluation (Signed)
 Anesthesia Post Note  Patient: Abigail Molina  Procedure(s) Performed: EGD (ESOPHAGOGASTRODUODENOSCOPY)  Patient location during evaluation: PACU Anesthesia Type: General Level of consciousness: awake and alert, oriented and patient cooperative Pain management: pain level controlled Vital Signs Assessment: post-procedure vital signs reviewed and stable Respiratory status: spontaneous breathing, nonlabored ventilation and respiratory function stable Cardiovascular status: blood pressure returned to baseline and stable Postop Assessment: adequate PO intake Anesthetic complications: no   No notable events documented.   Last Vitals:  Vitals:   03/22/24 1953 03/23/24 0421  BP: 113/67 113/67  Pulse: 88 89  Resp: 20 16  Temp: 36.8 C 36.9 C  SpO2: 98% 92%    Last Pain:  Vitals:   03/23/24 0614  TempSrc:   PainSc: 1                  Dorothey Gate

## 2024-03-23 NOTE — Progress Notes (Signed)
 Abigail Sink, MD [facility]   [address] [place], [place] [address] Phone: [phone] Fax : [phone]   Subjective: This patient had banding yesterday for anemia with melena.  She is followed at [facility] GI in the past.  She has had no further sign of any GI bleeding and her hemoglobin is stable.   Objective: Vital signs in last 24 hours: Vitals:   03/22/24 1953 03/23/24 0421 03/23/24 0500 03/23/24 0738  BP: 113/67 113/67  (!) 99/58  Pulse: 88 89  91  Resp: 20 16  16   Temp: 98.3 F (36.8 C) 98.5 F (36.9 C)  98.1 F (36.7 C)  TempSrc: Oral Oral  Oral  SpO2: 98% 92%  96%  Weight:   98.1 kg   Height:       Weight change:   Intake/Output Summary (Last 24 hours) at 03/23/2024 1243 Last data filed at 03/23/2024 0810 Gross per 24 hour  Intake 640 ml  Output 1350 ml  Net -710 ml     Exam: Heart:: Regular rate and rhythm or without murmur or extra heart sounds Lungs: normal and clear to auscultation and percussion Abdomen: soft, nontender, normal bowel sounds   Lab Results: @LABTEST2 @ Micro Results: Recent Results (from the past 240 hours)  Body fluid culture w Gram Stain     Status: None   Collection Time: 03/17/24  4:20 PM   Specimen: Paracentesis; Body Fluid  Result Value Ref Range Status   Specimen Description   Final    PARACENTESIS Performed at [facility], [address] [place], [place] [address]    Special Requests   Final    NONE Performed at [facility], [address] [place], [place] [address]    Gram Stain NO WBC SEEN NO ORGANISMS SEEN CYTOSPIN SMEAR   Final   Culture   Final    NO GROWTH 3 DAYS Performed at [facility], [address] [place], [place] [address]    Report Status 03/20/2024 FINAL  Final   Studies/Results: No results found. Medications: I have reviewed the patient's current medications. Scheduled Meds:  baclofen   10 mg Oral QHS   buPROPion   150 mg Oral Daily   citalopram   20 mg  Oral Daily   feeding supplement  237 mL Oral BID BM   ferrous sulfate   325 mg Oral Q breakfast   furosemide   20 mg Oral BID   gabapentin   300 mg Oral QHS   insulin  aspart  0-20 Units Subcutaneous TID WC   insulin  aspart  0-5 Units Subcutaneous QHS   insulin  aspart  8 Units Subcutaneous TID WC   insulin  glargine-yfgn  20 Units Subcutaneous BID   levETIRAcetam   750 mg Oral BID   midodrine   10 mg Oral TID WC   mirtazapine   15 mg Oral QHS   pantoprazole   40 mg Oral BID   rifaximin   550 mg Oral BID   sodium chloride  flush  3 mL Intravenous Q12H   spironolactone   50 mg Oral BID   Continuous Infusions: PRN Meds:.acetaminophen , guaiFENesin -dextromethorphan , lactulose , morphine  injection, ondansetron  (ZOFRAN ) IV, ondansetron , sodium chloride  flush, zolpidem    Assessment: Principal Problem:   Cirrhosis (HCC) Active Problems:   Hypertension   Esophageal varices in cirrhosis (HCC)   H/O diabetic gastroparesis   Portal hypertension (HCC)   Type 2 diabetes mellitus, with long-term current use of insulin  (HCC)   AKI (acute kidney injury) (HCC)   Hyponatremia   Acute on chronic  anemia   Obesity (BMI 30-39.9)   Melena    Plan: The patient had banding of esophageal varices and she has been having her Lasix  titrated up while watching her renal function due to her ascites.  The patient has a stable hemoglobin and has had no further sign of bleeding.  The patient should follow-up with her primary GI team to have her esophagus banded again in 4 weeks and she should follow-up with her outpatient GI team for titration of her diuretics for her recurrent ascites.  Nothing further to do from GI point of view.  I will sign off.  Please call if any further GI concerns or questions.  We would like to thank you for the opportunity to participate in the care of Abigail Molina.    LOS: 6 days   Abigail Sink, MD.FACG 03/23/2024, 12:43 PM Pager [phone] 7am-5pm  Check AMION for 5pm -7am coverage and on  weekends

## 2024-03-23 NOTE — TOC Transition Note (Signed)
 Transition of Care [facility]) - Discharge Note   Patient Details  Name: Abigail Molina MRN: [id number] Date of Birth: 08/01/1966  Transition [facility]) CM/SW Contact:  Loman Risk, RN Phone Number: 03/23/2024, 11:37 AM   Clinical Narrative:    Requested MD to order home health RN PT OT for resumption Notified [place]  at [facility] of discharge         Patient Goals and CMS Choice            Discharge Placement                       Discharge Plan and Services Additional resources added to the After Visit Summary for                                       Social Drivers of Health (SDOH) Interventions SDOH Screenings   Food Insecurity: Food Insecurity Present (03/19/2024)  Housing: Low Risk  (03/19/2024)  Transportation Needs: Unmet Transportation Needs (03/19/2024)  Utilities: Not At Risk (03/19/2024)  Depression (PHQ2-9): High Risk (11/02/2023)  Financial Resource Strain: Low Risk  (12/16/2023)   Received from [facility]  Physical Activity: Insufficiently Active (01/09/2021)   Received from [facility], [facility]  Social Connections: Moderately Isolated (09/02/2018)  Stress: Stress Concern Present (09/17/2022)   [facility] from [facility], [facility]  Tobacco Use: Low Risk  (03/22/2024)  Recent Concern: Tobacco Use - Medium Risk (01/04/2024)   Received from [facility] Literacy: Low Risk  (01/09/2021)   [facility], [facility]     Readmission Risk Interventions    03/18/2024    3:36 PM  Readmission Risk Prevention Plan  Transportation Screening Complete  Medication Review (RN Care Manager) Complete  HRI or Home Care Consult Complete  Skilled Nursing Facility Not Applicable

## 2024-03-23 NOTE — Discharge Instructions (Signed)
 Transportation Resources  Agency Name: [facility] Agency Address: [address] [place], [place] [address] Phone: [phone] Email: [email or web address] Website: [email or web address] Service(s) Offered: Housing services, self-sufficiency, congregate meal program, weatherization program, Field seismologist program, emergency food assistance,  housing counseling, home ownership program, wheels-towork program.  Agency Name: [facility] [employer] [phone]) Address: [address] [place], [place] [address] Phone: [phone] Website: [email or web address].[email or web address] Service(s) Offered: Transportation for [employer], subscription and demand response; Dial-a-Ride for citizens 74 years of age or older.  Agency Name: Department of Social Services Address: [address] [place], [place] [address] Phone: [phone] Service(s) Offered: Child support services; child welfare services; food stamps; Medicaid; work first family assistance; and aid with fuel,  rent, food and medicine, transportation assistance.  Agency Name: Disabled [employer] (DAV) Transportation  Network Phone: [phone] Service(s) Offered: Transports veterans to the [facility]. Call  forty-eight hours in advance and leave the name, telephone  number, date, and time of appointment. Veteran will be  contacted by the driver the day before the appointment to  arrange a pick up point   Transportation Resources  Agency Name: [facility] Agency Address: [address] [place], [place] [address] Phone: [phone] Email: [email or web address] Website: [email or web address] Service(s) Offered: Housing services, self-sufficiency, congregate meal program, weatherization program, Field seismologist program, emergency food assistance,  housing counseling, home ownership program, wheels-towork  program.  Agency Name: [facility] [employer] [phone]) Address: [address] [place], [place] [address] Phone: [phone] Website: [email or web address].[email or web address] Service(s) Offered: Transportation for [employer], subscription and demand response; Dial-a-Ride for citizens 47 years of age or older.  Agency Name: Department of Social Services Address: [address] [place], [place] [address] Phone: [phone] Service(s) Offered: Child support services; child welfare services; food stamps; Medicaid; work first family assistance; and aid with fuel,  rent, food and medicine, transportation assistance.  Agency Name: Disabled [employer] (DAV) Transportation  Network Phone: [phone] Service(s) Offered: Transports veterans to the [facility] medical center. Call  forty-eight hours in advance and leave the name, telephone  number, date, and time of appointment. Veteran will be  contacted by the driver the day before the appointment to  arrange a pick up point    [employer] ACTA currently provides door to door services. ACTA connects with PART daily for services to [facility]. ACTA also performs contract services to [employer] operates 27 vehicles, all but 3 mini-vans are equipped with lifts for special needs as well as the general public. ACTA drivers are each CDL certified and trained in First Aid and CPR. ACTA was established in 2002 by [employer]. An independent Industrial/product designer. ACTA operates via Cytogeneticist with required local 10% match funding from [place]. ACTA provides over 80,000 passenger trips each year, including [facility] and [employer] sites.  Call at least by 11 AM one business day prior to needing transportation  [employer].                      [place], [place] [address]     Office  Hours: Monday-Friday  8 AM - 5 PM  Food Resources  Agency Name: [facility] Agency Address: [address], [place], [place] [address] Phone: [phone] Website: [email or web address] Service(s) Offered: Housing services, self-sufficiency, congregate meal program, weatherization program, Event organiser program, emergency food assistance,  housing  counseling, home ownership program, wheels - to work program.  [employer] free for 60 and older at various locations from 9am-1pm, Monday-Friday:  [employer], [address] [place], [phone] -[facility], [address] [place] [phone]  -[facility], [address], [place] [phone]  -[address], [address], [place], [phone]  Agency Name: [facility] Address: [phone] [address], [place], [place] [address] Phone: [phone] Website: www.[email or web address] Service(s) Offered: Home delivered hot, frozen, and emergency  meals. Grocery assistance program which matches  volunteers one-on-one with seniors unable to grocery shop  for themselves. Must be 60 years and older; less than 20  hours of in-home aide service, limited or no driving ability;  live alone or with someone with a disability; live in  [place].  Agency Name: [employer] [facility] [employer]) Address: [address], [place], [place] [address] Phone: [phone] Service(s) Offered: Food is served to shut-ins, homeless, elderly, and low income people in the community every Saturday (11:30 am-12:30 pm) and Sunday (12:30 pm-1:30pm). Volunteers also offer help and encouragement in seeking employment,  and spiritual guidance.  Agency Name: Department of Social Services Address: [address] [place], [place] [address] Phone: [phone] Service(s) Offered: Child support services; child welfare services; food stamps; Medicaid;  work first family assistance; and aid with fuel,  rent, food and medicine.  Agency Name: Dietitian Address: [address] [place], [place] Phone: [phone] Website: www.dreamalign.com Services Offered: Monday 10:00am-12:00, 8:00pm-9:00pm, and Friday 10:00am-12:00.  Agency Name: [employer] [place] Address: [address] [place], [place] [address] Phone: [phone] Website: www.[email or web address] Service(s) Offered: Serves weekday meals, open from 11:30 am- 1:00 pm., and 6:30-7:30pm, Monday-Wednesday-Friday distributes food 3:30-6pm, Monday-Wednesday-Friday.  Agency Name: [facility] Address: [address] [place], [place] Phone: [phone] Website: [email or web address] Services Offered: Distributes food the 4th Saturday of the month, starting at 8:00 am  Agency Name: [facility] Address: [phone] [address] [place], [place] [address] Phone: [phone] Website: [email or web address]://[email or web address] Service(s) Offered: Bread of life, weekly food pantry. Open Wednesdays from 10:00am-noon.  Agency Name: [facility] [employer] Address: [address] [place], [place], [place] Phone: [phone] Services Offered: Distributes food 9am-1pm, Monday-Thursday. Call for details.  Agency Name: [facility] Address: [address] [place], [place] [address] Phone: [phone] Website: [email or web address] Service(s) Offered: Games developer. Call for assistance.  Agency Name: [facility] Address: [address] [place], [place] [address] Phone: [phone] Service Offered: Emergency Food Pantry. Call for appointment.  Agency Name: [facility] Address: [address], [place], [place] [address] Phone: [phone] Website: [email or web address] Services Offered: Games developer. Call for details  Agency Name: [facility] Address:  [address] [place], [place] Phone: [phone] Website: [email or web address] Service(s) Offered: Emergency Food Pantry. Call for details.  Agency Name: Holiday representative Address: [address], [place], [place] [address] Phone: [phone] or [phone] Website: [email or web address] Service(s) Offered: Distribute food 9am-11:30 am, Tuesday-Friday, and 1-3:30pm, Monday-Friday. Food pantry Monday-Friday 1pm-3pm, fresh items, Mon.-Wed.-Fri.  Agency Name: [facility] [employer] Address: [address] [place], [place] [address] Phone: [phone] Website: www.safealamance.org Services Offered: Distribute food Tues and Sats from 9:00am-noon. Closed 1st Saturday of each month. Call for details  Agency Name: [employer] Address: [address] [facility] [address], [place] [address] Phone: [phone]  Services Offered: Delivers meals every Thursday

## 2024-03-23 NOTE — Plan of Care (Signed)

## 2024-03-23 NOTE — Discharge Summary (Signed)
 Physician Discharge Summary   Patient: Abigail Molina MRN: [id number] DOB: 14-Jun-1966  Admit date:     03/17/2024  Discharge date: 03/23/24  Discharge Physician: Aisha Hove   PCP: [facility]   Recommendations at discharge:  {Tip this will not be part of the note when signed- Example include specific recommendations for outpatient follow-up, pending tests to follow-up on. (Optional):26781} PCP follow up in 1 week. Liver clinic follow up as scheduled. GI follow up in 4 weeks for repeat EGD.  Discharge Diagnoses: Principal Problem:   Cirrhosis (HCC) Active Problems:   Hypertension   Esophageal varices in cirrhosis (HCC)   H/O diabetic gastroparesis   Portal hypertension (HCC)   Type 2 diabetes mellitus, with long-term current use of insulin  (HCC)   AKI (acute kidney injury) (HCC)   Hyponatremia   Acute on chronic anemia   Obesity (BMI 30-39.9)   Melena  Resolved Problems:   * No resolved hospital problems. [facility] Course: No notes on file  Assessment and Plan: No notes have been filed under this hospital service. Service: Hospitalist     {Tip this will not be part of the note when signed Body mass index is 37.12 kg/m. , ,  Active Pressure Injury/Wound(s)     Pressure Ulcer  Duration          Pressure Injury 01/26/24 Buttocks Upper;Left Stage 2 -  Partial thickness loss of dermis presenting as a shallow open injury with a red, pink wound bed without slough. 56 days   Pressure Injury 01/27/24 Sacrum Medial Stage 3 -  Full thickness tissue loss. Subcutaneous fat may be visible but bone, tendon or muscle are NOT exposed. 56 days           (Optional):26781}  {(NOTE) Pain control PDMP Statment (Optional):26782} Consultants: GI Procedures performed: EGD  Disposition: Home Diet recommendation:  Discharge Diet Orders (From admission, onward)     Start     Ordered   03/23/24 0000  Diet - low sodium heart healthy        03/23/24 1105    03/23/24 0000  Diet Carb Modified        03/23/24 1105           Cardiac and Carb modified diet DISCHARGE MEDICATION: Allergies as of 03/23/2024   No Known Allergies      Medication List     STOP taking these medications    zolpidem  5 MG tablet Commonly known as: AMBIEN        TAKE these medications    Accu-Chek Softclix Lancets lancets SMARTSIG:Topical   aspirin  81 MG tablet Take 81 mg by mouth daily.   Baclofen  5 MG Tabs Take 2 tablets by mouth at bedtime.   BD Buyer, retail Use as directed to dispose of Cosentyx pens.   buPROPion  150 MG 24 hr tablet Commonly known as: WELLBUTRIN  XL Take 1 tablet (150 mg total) by mouth daily.   citalopram  20 MG tablet Commonly known as: CELEXA  Take 20 mg by mouth daily.   Cosentyx Sensoready (300 MG) 150 MG/ML Soaj Generic drug: Secukinumab (300 MG Dose) Inject into the skin.   FreeStyle Libre 3 Sensor Misc by Does not apply route daily.   furosemide  20 MG tablet Commonly known as: LASIX  Take 1 tablet (20 mg total) by mouth 2 (two) times daily. What changed:  medication strength how much to take when to take this   gabapentin  300 MG capsule Commonly known as: NEURONTIN  Take by  mouth once.   insulin  lispro 100 UNIT/ML injection Commonly known as: HUMALOG Inject 8 Units into the skin 3 (three) times daily with meals.   Insulin  Pen Needle 32G X 6 MM Misc 1 Syringe by Does not apply route. Use with Victoza .   BD Pen Needle Nano 2nd Gen 32G X 4 MM Misc Generic drug: Insulin  Pen Needle USE 1 NIGHTLY   Invokana 300 MG Tabs tablet Generic drug: canagliflozin Take 300 mg by mouth daily before breakfast.   lactulose  10 GM/15ML solution Commonly known as: CHRONULAC  Take 45 mLs (30 g total) by mouth 2 (two) times daily as needed for mild constipation or moderate constipation (titrate for 2-3 stools/day). What changed: See the new instructions.   Lantus  SoloStar 100 UNIT/ML Solostar Pen Generic drug:  insulin  glargine Inject 30 Units into the skin daily. What changed: how much to take   levETIRAcetam  500 MG tablet Commonly known as: KEPPRA  Take 750 mg by mouth 2 (two) times daily.   MAGnesium -Oxide 400 (240 Mg) MG tablet Generic drug: magnesium  oxide Take 2 tablets by mouth 2 (two) times daily.   metFORMIN  500 MG 24 hr tablet Commonly known as: GLUCOPHAGE -XR Take 1,000 mg by mouth 2 (two) times daily.   midodrine  10 MG tablet Commonly known as: PROAMATINE  Take 1 tablet (10 mg total) by mouth 3 (three) times daily with meals. What changed: how much to take   mirtazapine  15 MG tablet Commonly known as: REMERON  Take 1 tablet (15 mg total) by mouth at bedtime.   multivitamin with minerals Tabs tablet Take 1 tablet by mouth daily.   naltrexone 50 MG tablet Commonly known as: DEPADE Take 50 mg by mouth daily.   ondansetron  4 MG disintegrating tablet Commonly known as: ZOFRAN -ODT Dissolve 1 tablet (4 mg total) in the mouth every twelve (12) hours as needed for nausea.   pantoprazole  40 MG tablet Commonly known as: PROTONIX  TAKE ONE TABLET BY MOUTH EVERY DAY   promethazine 12.5 MG tablet Commonly known as: PHENERGAN Take by mouth.   rosuvastatin  20 MG tablet Commonly known as: CRESTOR  Take 1 tablet (20 mg total) by mouth daily.   Slow Fe 142 (45 Fe) MG Tbcr Generic drug: Ferrous Sulfate  Take by mouth.   spironolactone  50 MG tablet Commonly known as: ALDACTONE  Take 1 tablet (50 mg total) by mouth 2 (two) times daily. What changed:  medication strength how much to take when to take this   thiamine  100 MG tablet Commonly known as: VITAMIN B1 Take 1 tablet (100 mg total) by mouth daily.   Xifaxan  550 MG Tabs tablet Generic drug: rifaximin  Take 550 mg by mouth 2 (two) times daily.   zinc  sulfate (50mg  elemental zinc ) 220 (50 Zn) MG capsule Take 1 capsule (220 mg total) by mouth daily.        Discharge Exam: Filed Weights   03/20/24 0451 03/21/24 0500  03/23/24 0500  Weight: 98.8 kg 98.6 kg 98.1 kg   ***  Condition at discharge: stable  The results of significant diagnostics from this hospitalization (including imaging, microbiology, ancillary and laboratory) are listed below for reference.   Imaging Studies: US  Paracentesis Result Date: 03/21/2024 INDICATION: 58 year old female with a history of NASH liver cirrhosis who recently underwent paracentesis on 03/17/24 with 3 liters out. EXAM: ULTRASOUND GUIDED THERAPEUTIC PARACENTESIS MEDICATIONS: 8 mL 1% lidocaine  COMPLICATIONS: None immediate. PROCEDURE: Informed written consent was obtained from the patient after a discussion of the risks, benefits and alternatives to treatment. A timeout was performed  prior to the initiation of the procedure. Initial ultrasound scanning demonstrates a moderate to large amount of ascites within the left lower abdominal quadrant. The left lower abdomen was prepped and draped in the usual sterile fashion. 1% lidocaine  was used for local anesthesia. Following this, a 19 gauge, 7-cm, Yueh catheter was introduced. An ultrasound image was saved for documentation purposes. The paracentesis was performed. The catheter was removed and a dressing was applied. The patient tolerated the procedure well without immediate post procedural complication. Patient received post-procedure intravenous albumin ; see nursing notes for details. FINDINGS: A total of approximately 3.6 liters of yellow fluid was removed. IMPRESSION: Successful ultrasound-guided paracentesis yielding 3.6 liters of peritoneal fluid. PLAN: If the patient eventually requires >/=2 paracenteses in a 30 day period, candidacy for formal evaluation by the [facility] Interventional Radiology Portal Hypertension Clinic will be assessed. Performed by Terressa Fess, NP Electronically Signed   By: Myrlene Asper D.O.   On: 03/21/2024 14:21   US  Paracentesis Result Date: 03/17/2024 INDICATION: 58 year old female with a  history of NASH liver cirrhosis who presented to the ED for worsening shortness of breath and lower extremity swelling for the past several weeks. Request for diagnostic and therapeutic paracentesis. EXAM: ULTRASOUND GUIDED PARACENTESIS MEDICATIONS: 1% lidocaine , 8 mL. COMPLICATIONS: None immediate. PROCEDURE: Informed written consent was obtained from the patient after a discussion of the risks, benefits and alternatives to treatment. A timeout was performed prior to the initiation of the procedure. Initial ultrasound scanning demonstrates a large amount of ascites within the right lower abdominal quadrant. The right lower abdomen was prepped and draped in the usual sterile fashion. 1% lidocaine  was used for local anesthesia. Following this, a 19 gauge, 7-cm, Yueh catheter was introduced. An ultrasound image was saved for documentation purposes. The paracentesis was performed. The catheter was removed and a dressing was applied. The patient tolerated the procedure well without immediate post procedural complication. FINDINGS: A total of approximately 3 L of clear yellow fluid was removed. Samples were sent to the laboratory as requested by the clinical team. IMPRESSION: Successful ultrasound-guided paracentesis yielding 3 liters of peritoneal fluid. Procedure performed by: Estella Helling, PA-C PLAN: If the patient eventually requires >/=2 paracenteses in a 30 day period, candidacy for formal evaluation by the [facility] Interventional Radiology Portal Hypertension Clinic will be assessed. Electronically Signed   By: Elene Griffes M.D.   On: 03/17/2024 17:45   CT Chest W Contrast Result Date: 03/17/2024 CLINICAL DATA:  Pleural effusion. EXAM: CT CHEST WITH CONTRAST TECHNIQUE: Multidetector CT imaging of the chest was performed during intravenous contrast administration. RADIATION DOSE REDUCTION: This exam was performed according to the departmental dose-optimization program which includes automated exposure  control, adjustment of the mA and/or kV according to patient size and/or use of iterative reconstruction technique. CONTRAST:  75mL OMNIPAQUE  IOHEXOL  300 MG/ML  SOLN COMPARISON:  03/31/2023 FINDINGS: Cardiovascular: The heart size is upper normal to borderline enlarged. Coronary artery calcification is evident. Mild atherosclerotic calcification is noted in the wall of the thoracic aorta. Mediastinum/Nodes: No mediastinal lymphadenopathy. No evidence for gross hilar lymphadenopathy although assessment is limited by the lack of intravenous contrast on the current study. The esophagus has normal imaging features. There is no axillary lymphadenopathy. Lungs/Pleura: Subsegmental atelectasis or linear scarring is noted in the dependent right lower lung. Left lower lobe collapse/consolidation is new since 03/31/2023. No central obstructing mass lesion or lymphadenopathy evident moderate left pleural effusion is new since 03/31/2023. Upper Abdomen: Cirrhotic liver morphology is associated with large  volume ascites, markedly progressive since 01/26/2024 abdomen/pelvis CT. Musculoskeletal: Multiple nonacute bilateral anterior rib fractures evident. No worrisome lytic or sclerotic osseous abnormality. IMPRESSION: 1. Left lower lobe collapse/consolidation with moderate left pleural, new since chest CT of 03/31/2023. These changes were present on an abdomen pelvis CT of 01/26/2024 but are progressive since that time. 2. Cirrhotic liver morphology is associated with large volume ascites with ascites markedly progressive since 01/26/2024 abdomen/pelvis CT. 3.  Aortic Atherosclerois (ICD10-170.0) Electronically Signed   By: Donnal Fusi M.D.   On: 03/17/2024 12:04   DG Chest Port 1 View Result Date: 03/17/2024 CLINICAL DATA:  Right-sided abdominal pain with bilateral leg swelling. EXAM: PORTABLE CHEST 1 VIEW COMPARISON:  01/26/2024. FINDINGS: Low lung volumes with bibasilar atelectasis. No acute cardiopulmonary findings.  Retrocardiac left base collapse/consolidation is associated with small to moderate left pleural effusion. Right lung clear. The cardiopericardial silhouette is within normal limits for size. No acute bony abnormality. Telemetry leads overlie the chest. IMPRESSION: 1. Low lung volumes with bibasilar atelectasis. 2. Retrocardiac left base collapse/consolidation with small to moderate left pleural effusion. Given unilateral disease, close follow-up recommended as neoplasm can present with these imaging features. Electronically Signed   By: Donnal Fusi M.D.   On: 03/17/2024 07:39    Microbiology: Results for orders placed or performed during the hospital encounter of 03/17/24  Body fluid culture w Gram Stain     Status: None   Collection Time: 03/17/24  4:20 PM   Specimen: Paracentesis; Body Fluid  Result Value Ref Range Status   Specimen Description   Final    PARACENTESIS Performed at [facility], [address] [place], [place] [address]    Special Requests   Final    NONE Performed at [facility], [address] [place], [place] [address]    Gram Stain NO WBC SEEN NO ORGANISMS SEEN CYTOSPIN SMEAR   Final   Culture   Final    NO GROWTH 3 DAYS Performed at [facility], [address], [place], [place] [address]    Report Status 03/20/2024 FINAL  Final    Labs: CBC: Recent Labs  Lab 03/17/24 0939 03/20/24 0457 03/21/24 0347 03/22/24 0449 03/23/24 0815  WBC 4.8 3.9* 4.5 4.2 5.8  HGB 7.9* 7.3* 6.7* 7.7* 8.6*  HCT 26.4* 23.6* 22.4* 26.2* 28.9*  MCV 74.4* 71.7* 73.9* 76.4* 76.9*  PLT 114* 117* 117* 120* 142*   Basic Metabolic Panel: Recent Labs  Lab 03/18/24 0331 03/18/24 1749 03/20/24 0457 03/21/24 0347 03/22/24 0449 03/23/24 0443  NA 128*  --  132* 129* 130* 131*  K 3.5  --  4.0 4.3 3.8 4.3  CL 87*  --  93* 93* 94* 97*  CO2 27  --  28 27 26 26   GLUCOSE 353* 376* 284* 359* 135* 194*  BUN 35*  --  34* 36* 31* 24*  CREATININE 1.33*   --  1.47* 1.22* 1.25* 1.17*  CALCIUM  8.6*  --  9.1 8.8* 8.9 9.0   Liver Function Tests: Recent Labs  Lab 03/17/24 0939 03/18/24 0331 03/22/24 0449 03/23/24 0443  AST 47* 39 43* 51*  ALT 19 18 16 17   ALKPHOS 322* 279* 232* 252*  BILITOT 5.0* 5.0* 5.6* 7.2*  PROT 6.9 6.9 6.5 6.9  ALBUMIN  2.1* 2.5* 2.6* 2.8*   CBG: Recent Labs  Lab 03/22/24 0759 03/22/24 1305 03/22/24 1624 03/22/24 2126 03/23/24 0739  GLUCAP 141* 199* 126* 143* 184*    Discharge time spent: 37  minutes.  Signed: Aisha Hove, MD [facility] 03/23/2024

## 2024-03-23 NOTE — TOC Progression Note (Signed)
 Transition of Care [facility]) - Progression Note    Patient Details  Name: Abigail Molina MRN: [id number] Date of Birth: 09-May-1966  Transition of Care [facility]) CM/SW Contact  Loman Risk, RN Phone Number: 03/23/2024, 10:41 AM  Clinical Narrative:     Per SDOH Food and transportation resources added to AVS       Expected Discharge Plan and Services                                               Social Determinants of Health (SDOH) Interventions SDOH Screenings   Food Insecurity: Food Insecurity Present (03/19/2024)  Housing: Low Risk  (03/19/2024)  Transportation Needs: Unmet Transportation Needs (03/19/2024)  Utilities: Not At Risk (03/19/2024)  Depression (PHQ2-9): High Risk (11/02/2023)  Financial Resource Strain: Low Risk  (12/16/2023)   Received from [facility]  Physical Activity: Insufficiently Active (01/09/2021)   Received from [facility], [facility]  Social Connections: Moderately Isolated (09/02/2018)  Stress: Stress Concern Present (09/17/2022)   Received from [facility], [facility]  Tobacco Use: Low Risk  (03/22/2024)  Recent Concern: Tobacco Use - Medium Risk (01/04/2024)   Received from [facility] Literacy: Low Risk  (01/09/2021)   Received from [facility], [facility]    Readmission Risk Interventions    03/18/2024    3:36 PM  Readmission Risk Prevention Plan  Transportation Screening Complete  Medication Review (RN Care Manager) Complete  HRI or Home Care Consult Complete  Skilled Nursing Facility Not Applicable

## 2024-03-23 NOTE — TOC Transition Note (Signed)
 Transition of Care [facility]) - Discharge Note   Patient Details  Name: Abigail Molina MRN: [id number] Date of Birth: [birth year]  Transition of Care [facility]) CM/SW Contact:  Loman Risk, RN Phone Number: 03/23/2024, 1:54 PM   Clinical Narrative:        Medicaid transport has been called. Cousin is aware she needs to download the app for [employer] (which I told them last admisison 2C will receive a call 30 minutes - 3 hours when they are arriving to medical mall, and patient will need to be brought down if needed transports number is [phone] Reference number is [id number]      Patient Goals and CMS Choice            Discharge Placement                       Discharge Plan and Services Additional resources added to the After Visit Summary for                                       Social Drivers of Health (SDOH) Interventions SDOH Screenings   Food Insecurity: Food Insecurity Present (03/19/2024)  Housing: Low Risk  (03/19/2024)  Transportation Needs: Unmet Transportation Needs (03/19/2024)  Utilities: Not At Risk (03/19/2024)  Depression (PHQ2-9): High Risk (11/02/2023)  Financial Resource Strain: Low Risk  (12/16/2023)   Received from [facility]  Physical Activity: Insufficiently Active (01/09/2021)   Received from [facility], [facility]  Social Connections: Moderately Isolated (09/02/2018)  Stress: Stress Concern Present (09/17/2022)   Received from [facility], [facility]  Tobacco Use: Low Risk  (03/22/2024)  Recent Concern: Tobacco Use - Medium Risk (01/04/2024)   Received from [facility] Literacy: Low Risk  (01/09/2021)   Received from [facility], [facility]     Readmission Risk Interventions    03/18/2024    3:36 PM  Readmission Risk Prevention Plan  Transportation Screening Complete  Medication Review (RN Care Manager) Complete  HRI or Home Care Consult Complete  Skilled Nursing Facility Not  Applicable

## 2024-03-25 NOTE — Unmapped (Signed)
 Copied from CRM #[id number]. Topic: Access To Clinicians - Req Clinic Call Back  >> Mar 25, 2024 12:02 PM Melodie Spry wrote:  Ashley Briggs with [facility] .  preferred contact: [phone]  Calling to request order for nursing weekly for wound care.  Calling to report a pain level of 9 on 03-25-2024     Routine callback turnaround time: 24-48 business hours. Programmer, systems Notified)

## 2024-03-25 NOTE — Unmapped (Signed)
 Spoke with Adell Hones from [facility] is requesting verbal orders for wound care 1: weekly for wound care.  Verbal order provided. Official orders will be faxed for provider to sign.    In addition Adell Hones is reporting patient is pain level being at a 7.    -Attempted to call patient to follow up on pain level unable to reach mailbox is full.

## 2024-03-29 NOTE — Unmapped (Signed)
 Neomi Banks called from [facility] requsting verbal order for PT x 1 week     Ardath Koh, CMA

## 2024-03-31 NOTE — Unmapped (Signed)
 Opened in error

## 2024-04-05 NOTE — Telephone Encounter (Signed)
 The patient called back to schedule her repeat EGD.

## 2024-04-06 ENCOUNTER — Other Ambulatory Visit: Payer: Self-pay

## 2024-04-06 DIAGNOSIS — I8511 Secondary esophageal varices with bleeding: Secondary | ICD-10-CM

## 2024-04-06 NOTE — Unmapped (Signed)
 Patient states that they have only missed 2 doses (1 month) since due to receiving the medication from another pharmacy. Patient has been in and out of the hospital since January. She is actively flaring, but topicals are currently helping a little. Dr. Georgian Kirk is aware (per the patient). [facility]    [facility] Clinical Assessment & Refill Coordination Note    Ashley Briggs, DOB: 18-Jul-1966  Phone: [phone] (home)     All above HIPAA information was verified with patient.     Was a Nurse, learning disability used for this call? No    Specialty Medication(s):   Inflammatory Disorders: Cosentyx      Current Outpatient Medications   Medication Sig Dispense Refill    acetaminophen  (TYLENOL ) 325 MG tablet Take 2 tablets (650 mg total) by mouth two (2) times a day as needed for pain. 180 tablet 1    aspirin  (ECOTRIN) 81 MG tablet Take 1 tablet (81 mg total) by mouth nightly. 30 tablet 0    baclofen  (LIORESAL ) 5 mg Tab tablet Take 1 tablet (5 mg total) by mouth nightly. 30 tablet 0    blood sugar diagnostic Strp by Other route Three (3) times a day before meals. For glucometer brand preferred by patient 100 strip 6    blood-glucose sensor (FREESTYLE LIBRE 3 SENSOR) Devi Use 1 sensor every 15 days 6 each 3    buPROPion  (WELLBUTRIN  XL) 150 MG 24 hr tablet Take 1 tablet (150 mg total) by mouth nightly.      carvedilol  (COREG ) 6.25 MG tablet Take 1 tablet (6.25 mg total) by mouth two (2) times a day. 60 tablet 11    chlorhexidine  (PERIDEX ) 0.12 % solution 15 mL by Mouth route daily as needed.      empty container Misc Use as directed to dispose of Cosentyx  pens. 1 each 2    furosemide  (LASIX ) 40 MG tablet Take 1 tablet (40 mg total) by mouth daily. 30 tablet 0    gabapentin  (NEURONTIN ) 300 MG capsule Take 1 capsule (300 mg total) by mouth nightly. 30 capsule 0    insulin  glargine (BASAGLAR , LANTUS ) 100 unit/mL (3 mL) injection pen Inject 0.36 mL (36 Units total) under the skin nightly. 15 mL 3    insulin  lispro (HUMALOG ) 100 unit/mL injection pen Inject 8 Units under the skin Three (3) times a day before meals. 15 mL 3    INVOKANA  300 mg Tab tablet TAKE 1 TABLET BY MOUTH ONCE DAILY BEFORE BREAKFAST 90 tablet 0    lancets (ACCU-CHEK SOFTCLIX LANCETS) Misc Use as directed to test once daily. 100 each 3    levETIRAcetam  (KEPPRA ) 500 MG tablet Take 1.5 tablets (750 mg total) by mouth two (2) times a day. 270 tablet 1    lidocaine  HCL-menthoL (NERVIVE PAIN RELIEVING) 4-1 % srlo Apply topically.      magnesium  oxide (MAG-OX) 400 mg (241.3 mg elemental magnesium ) tablet Take 2 tablets (800 mg total) by mouth two (2) times a day. 120 tablet 11    metFORMIN  (GLUCOPHAGE -XR) 500 MG 24 hr tablet TAKE 2 TABLETS BY MOUTH IN THE MORNING AND 2 TABLETS IN THE EVENING WITH MEALS 360 tablet 0    mirtazapine  (REMERON ) 15 MG tablet Take 1 tablet (15 mg total) by mouth nightly.      naltrexone  (DEPADE) 50 mg tablet Take 1 tablet (50 mg total) by mouth nightly. 90 tablet 1    pen needle, diabetic (ULTICARE PEN NEEDLE) 32 gauge x 1/4 (6  mm) Ndle Inject 1 each under the skin nightly. 100 each 1    rifAXIMin  (XIFAXAN ) 550 mg Tab Take 1 tablet (550 mg total) by mouth two (2) times a day. 60 tablet 11    secukinumab  (COSENTYX  PEN, 2 PENS,) 150 mg/mL PnIj injection Inject the contents of 2 pens (300 mg total) under the skin every twenty-eight (28) days. Maintenance dose 2 mL 5    simvastatin  (ZOCOR ) 20 MG tablet Take 1 tablet (20 mg total) by mouth every evening. 90 tablet 1    spironolactone  (ALDACTONE ) 100 MG tablet Take 1 tablet (100 mg total) by mouth daily. 90 tablet 2    zolpidem  (AMBIEN ) 5 MG tablet Take 1 tablet (5 mg total) by mouth nightly as needed.       No current facility-administered medications for this visit.        Changes to medications: Ashley Briggs reports no changes at this time.    Medication list has been reviewed and updated in Epic: Yes    No Known Allergies    Changes to allergies: No    Allergies have been reviewed and updated in Epic: Yes    SPECIALTY MEDICATION ADHERENCE     Cosentyx  Pen 150 mg/ml: 0 doses of medicine on hand     Medication Adherence    Patient reported X missed doses in the last month: 2  Specialty Medication: Cosentyx  Pen 150mg /mL  Informant: patient  Confirmed plan for next specialty medication refill: delivery by pharmacy  Refills needed for supportive medications: not needed          Specialty medication(s) dose(s) confirmed: Regimen is correct and unchanged.     Are there any concerns with adherence? No    Adherence counseling provided? Not needed    CLINICAL MANAGEMENT AND INTERVENTION      Clinical Benefit Assessment:    Do you feel the medicine is effective or helping your condition? Yes    Clinical Benefit counseling provided? Not needed    Adverse Effects Assessment:    Are you experiencing any side effects? No    Are you experiencing difficulty administering your medicine? No    Quality of Life Assessment:    Quality of Life    Rheumatology  Oncology  Dermatology  1. What impact has your specialty medication had on the symptoms of your skin condition (i.e. itchiness, soreness, stinging)?: Some  2. What impact has your specialty medication had on your comfort level with your skin?: Some  Cystic Fibrosis          How many days over the past month did your condition  keep you from your normal activities? For example, brushing your teeth or getting up in the morning. Patient declined to answer    Have you discussed this with your provider? Not needed    Acute Infection Status:    Acute infections noted within Epic:  No active infections    Patient reported infection: None    Therapy Appropriateness:    Is therapy appropriate based on current medication list, adverse reactions, adherence, clinical benefit and progress toward achieving therapeutic goals? Yes, therapy is appropriate and should be continued     Clinical Intervention:    Was an intervention completed as part of this clinical assessment? No    DISEASE/MEDICATION-SPECIFIC INFORMATION      For patients on injectable medications: Patient currently has 0 doses left.  Next injection is scheduled for 04/08/2024.    Chronic Inflammatory Diseases: Have you experienced any  flares in the last month? Yes, actively having a mild flare. Topicals are providing minimal help.  Has this been reported to your provider? Yes, Dr. Georgian Kirk is aware (per the patient).    PATIENT SPECIFIC NEEDS     Does the patient have any physical, cognitive, or cultural barriers? No    Is the patient high risk? No    Does the patient require physician intervention or other additional services (i.e., nutrition, smoking cessation, social work)? No    Does the patient have an additional or emergency contact listed in their chart? Yes    SOCIAL DETERMINANTS OF HEALTH     At the [facility], we have learned that life circumstances - like trouble affording food, housing, utilities, or transportation can affect the health of many of our patients.   That is why we wanted to ask: are you currently experiencing any life circumstances that are negatively impacting your health and/or quality of life? No    Social Drivers of Health     Food Insecurity: Patient Unable To Answer (01/26/2024)    Received from [facility]    Hunger Vital Sign     Worried About Running Out of Food in the Last Year: Patient unable to answer     Ran Out of Food in the Last Year: Patient unable to answer   Tobacco Use: Low Risk  (01/26/2024)    Received from [facility]    Patient History     Smoking Tobacco Use: Never     Smokeless Tobacco Use: Never     Passive Exposure: Not on file   Recent Concern: Tobacco Use - Medium Risk (01/04/2024)    Patient History     Smoking Tobacco Use: Never     Smokeless Tobacco Use: Never     Passive Exposure: Current   Transportation Needs: Patient Unable To Answer (01/26/2024)    Received from [facility] - Transportation     Lack of Transportation (Medical): Patient unable to answer     Lack of Transportation (Non-Medical): Patient unable to answer   Alcohol Use: Not At Risk (01/09/2021)    Alcohol Use     How often do you have a drink containing alcohol?: Monthly or less     How many drinks containing alcohol do you have on a typical day when you are drinking?: 1 - 2     How often do you have 5 or more drinks on one occasion?: Never   Housing: Low Risk  (12/16/2023)    Housing     Within the past 12 months, have you ever stayed: outside, in a car, in a tent, in an overnight shelter, or temporarily in someone else's home (i.e. couch-surfing)?: No     Are you worried about losing your housing?: No   Physical Activity: Insufficiently Active (01/09/2021)    Exercise Vital Sign     Days of Exercise per Week: 1 day     Minutes of Exercise per Session: 40 min   Utilities: Patient Unable To Answer (01/26/2024)    Received from [facility]     Threatened with loss of utilities: Patient unable to answer   Stress: Stress Concern Present (09/17/2022)    [employer] of Occupational Health - Occupational Stress Questionnaire     Feeling of Stress : Very much   Interpersonal Safety: Not At Risk (09/17/2022)    Interpersonal Safety     Unsafe  Where You Currently Live: No     Physically Hurt by Anyone: No     Abused by Anyone: No   Substance Use: Not on file (10/06/2023)   Intimate Partner Violence: Patient Unable To Answer (01/26/2024)    Received from [facility]    Humiliation, Afraid, Rape, and Kick questionnaire     Fear of Current or Ex-Partner: Patient unable to answer     Emotionally Abused: Patient unable to answer     Physically Abused: Patient unable to answer     Sexually Abused: Patient unable to answer   Social Connections: Moderately Isolated (09/02/2018)    Received from [facility]    Social Connection and Isolation Panel [NHANES]     Frequency of Communication with Friends and Family: Once a week     Frequency of Social Gatherings with Friends and Family: Twice a week     Attends Religious Services: Never     Database administrator or Organizations: No     Attends Banker Meetings: Never     Marital Status: Widowed   Physicist, medical Strain: Low Risk  (12/16/2023)    Overall Financial Resource Strain (CARDIA)     Difficulty of Paying Living Expenses: Not very hard   Health Literacy: Low Risk  (01/09/2021)    Health Literacy     : Never   Internet Connectivity: No Internet connectivity concern identified (09/17/2022)    Internet Connectivity     Do you have access to internet services: Yes     How do you connect to the internet: Personal Device at home     Is your internet connection strong enough for you to watch video on your device without major problems?: Yes     Do you have enough data to get through the month?: Yes     Does at least one of the devices have a camera that you can use for video chat?: Yes       Would you be willing to receive help with any of the needs that you have identified today? No       SHIPPING     Specialty Medication(s) to be Shipped:   Inflammatory Disorders: Cosentyx     Other medication(s) to be shipped: No additional medications requested for fill at this time     Changes to insurance: No    Cost and Payment: Patient has a copay of $4. They are aware and have authorized the pharmacy to charge the credit card on file.    Delivery Scheduled: Yes, Expected medication delivery date: 04/08/2024.     Medication will be delivered via Same Day Courier to the confirmed prescription address in [facility].    The patient will receive a drug information handout for each medication shipped and additional FDA Medication Guides as required.  Verified that patient has previously received a Conservation officer, historic buildings and a Surveyor, mining.    The patient or caregiver noted above participated in the development of this care plan and knows that they can request review of or adjustments to the care plan at any time.      All of the patient's questions and concerns have been addressed.    Court Distance, PharmD   [facility] Specialty Pharmacist

## 2024-04-06 NOTE — Telephone Encounter (Signed)
 Procedure clearance faxed to PCP Mangel, Benison Pap, DO

## 2024-04-07 ENCOUNTER — Other Ambulatory Visit: Payer: Self-pay | Admitting: Psychiatry

## 2024-04-07 DIAGNOSIS — R110 Nausea: Principal | ICD-10-CM

## 2024-04-07 MED ORDER — SCOPOLAMINE 1 MG OVER 3 DAYS TRANSDERMAL PATCH
MEDICATED_PATCH | TRANSDERMAL | 0 refills | 30.00000 days | Status: CP
Start: 2024-04-07 — End: 2025-04-07

## 2024-04-07 NOTE — Unmapped (Signed)
 Called patient to ask about nausea. RN wanted to question how long has nausea been happening, has there been any vomiting, what makes it better/worse, what has been tried and has she taken scopolamine  before and if so when and for what. Patient did not answer. Voicemail gave patient name but was full. Unable to leave a voicemail message.

## 2024-04-07 NOTE — Unmapped (Signed)
 Ashley Briggs called, patient is requesting scopolamine  patches for nausea. Unable to get additional information due to bad phone connection. Please call Ashley Briggs for any additional information, [phone].

## 2024-04-07 NOTE — Unmapped (Signed)
 Patient is requesting the following refill  Requested Prescriptions     Pending Prescriptions Disp Refills    scopolamine  (TRANSDERM-SCOP) 1 mg over 3 days [Pharmacy Med Name: SCOPOLAMINE  1MG /3DAY DIS] 10 patch 0     Sig: Place 1 patch (1 mg total) on the skin every third day.       Recent Visits  Date Type Provider Dept   12/07/23 Office Visit Mangel, Benison Pap, DO [place] [facility] [address] [facility]   10/26/23 Office Visit Mangel, Benison Pap, DO [place] [facility] [address] At [facility]   08/31/23 Office Visit Sonna Dus, MD [place] [facility] [address] At [facility]   05/08/23 Office Visit Mangel, Benison Pap, DO [place] [facility] [address] At [facility]   Showing recent visits within past 365 days and meeting all other requirements  Future Appointments  Date Type Provider Dept   05/10/24 Appointment Mangel, Benison Pap, DO  [facility] [address] At [facility]   Showing future appointments within next 365 days and meeting all other requirements       Labs: Not applicable this refill

## 2024-04-08 DIAGNOSIS — E113212 Type 2 diabetes mellitus with mild nonproliferative diabetic retinopathy with macular edema, left eye: Principal | ICD-10-CM

## 2024-04-08 DIAGNOSIS — R188 Other ascites: Principal | ICD-10-CM

## 2024-04-08 DIAGNOSIS — R110 Nausea: Principal | ICD-10-CM

## 2024-04-08 DIAGNOSIS — Z794 Long term (current) use of insulin: Principal | ICD-10-CM

## 2024-04-08 DIAGNOSIS — R601 Generalized edema: Principal | ICD-10-CM

## 2024-04-08 MED ORDER — SCOPOLAMINE 1 MG OVER 3 DAYS TRANSDERMAL PATCH
MEDICATED_PATCH | TRANSDERMAL | 0 refills | 30.00000 days
Start: 2024-04-08 — End: 2025-04-08

## 2024-04-08 MED ORDER — CANAGLIFLOZIN 300 MG TABLET
ORAL_TABLET | 0 refills | 0.00000 days
Start: 2024-04-08 — End: ?

## 2024-04-08 MED ORDER — ASPIRIN 81 MG TABLET,DELAYED RELEASE
ORAL_TABLET | Freq: Every evening | ORAL | 0 refills | 30.00000 days
Start: 2024-04-08 — End: ?

## 2024-04-08 MED ORDER — FUROSEMIDE 40 MG TABLET
ORAL_TABLET | Freq: Every day | ORAL | 0 refills | 30.00000 days
Start: 2024-04-08 — End: 2024-05-08

## 2024-04-08 MED FILL — COSENTYX PEN 300 MG/2 PENS (150 MG/ML) SUBCUTANEOUS PEN INJECTOR: SUBCUTANEOUS | 28 days supply | Qty: 2 | Fill #0

## 2024-04-08 NOTE — Telephone Encounter (Signed)
 Please contact her to make a follow up in person visit.

## 2024-04-08 NOTE — Telephone Encounter (Signed)
 Staff reached out in March to call and schedule appointment. Another attempt and phone mailbox is full. A MyChart message sent to call if interested in scheduling an appointment.

## 2024-04-11 MED ORDER — CANAGLIFLOZIN 300 MG TABLET
ORAL_TABLET | Freq: Every day | ORAL | 0 refills | 90.00000 days | Status: CP
Start: 2024-04-11 — End: 2024-07-10
  Filled 2024-05-05: qty 90, 90d supply, fill #0

## 2024-04-11 MED ORDER — SCOPOLAMINE 1 MG OVER 3 DAYS TRANSDERMAL PATCH
MEDICATED_PATCH | TRANSDERMAL | 0 refills | 30.00000 days | Status: CP
Start: 2024-04-11 — End: 2025-04-11
  Filled 2024-04-29: qty 10, 30d supply, fill #0

## 2024-04-11 MED ORDER — FUROSEMIDE 40 MG TABLET
ORAL_TABLET | Freq: Every day | ORAL | 0 refills | 30.00000 days | Status: CP
Start: 2024-04-11 — End: 2024-05-11
  Filled 2024-04-14: qty 30, 30d supply, fill #0

## 2024-04-11 MED ORDER — ASPIRIN 81 MG TABLET,DELAYED RELEASE
ORAL_TABLET | Freq: Every evening | ORAL | 0 refills | 30.00000 days | Status: CP
Start: 2024-04-11 — End: ?
  Filled 2024-04-14: qty 30, 30d supply, fill #0

## 2024-04-11 NOTE — Unmapped (Signed)
 Needs verbal order for PT for 1 week x 9 days

## 2024-04-11 NOTE — Unmapped (Signed)
 Ashley Briggs, Jearldine Mina L, LPN  Attempted to scheduled patient vm is not set up

## 2024-04-11 NOTE — Unmapped (Signed)
 Patient is requesting the following refill  Requested Prescriptions     Pending Prescriptions Disp Refills    canagliflozin  (INVOKANA ) 300 mg Tab tablet 90 tablet 0       Recent Visits  Date Type Provider Dept   12/07/23 Office Visit Mangel, Benison Pap, DO [place] [facility] [address] [facility]   11/06/23 Office Visit Pou, Rocky Cipro, MD [facility]   10/26/23 Office Visit Mangel, Benison Pap, DO [place] [facility] [address] At [facility]   09/04/23 Office Visit Pou, Rocky Cipro, MD [place] [facility]   08/31/23 Office Visit Sonna Dus, MD [place] [facility] [address] At [facility]   08/11/23 Telemedicine Pou, Rocky Cipro, MD [facility]   05/08/23 Office Visit Mangel, Benison Pap, DO [place] [facility] [address] At [facility]   Showing recent visits within past 365 days and meeting all other requirements  Future Appointments  Date Type Provider Dept   05/10/24 Appointment Mangel, Benison Pap, DO [place] [facility] [address] At [facility]   Showing future appointments within next 365 days and meeting all other requirements       Labs: A1c:   Hemoglobin A1C (%)   Date Value   11/24/2023 7.9 (H)   11/04/2022 7.8 (A)

## 2024-04-11 NOTE — Unmapped (Signed)
 Addended by: Skip Dull PAP on: 04/11/2024 01:03 PM     Modules accepted: Orders

## 2024-04-11 NOTE — Unmapped (Signed)
 Needs appt. Pls forward refill to pcp in meantime otherwise

## 2024-04-12 NOTE — Unmapped (Signed)
 Pt called to set up[ appt with Dr. Derril Flint, no answer left voice mail (by front desk)

## 2024-04-12 NOTE — Telephone Encounter (Signed)
 Procedure clearance faxed x 2

## 2024-04-15 ENCOUNTER — Telehealth: Admitting: Psychiatry

## 2024-04-15 ENCOUNTER — Encounter: Payer: Self-pay | Admitting: Psychiatry

## 2024-04-15 DIAGNOSIS — F331 Major depressive disorder, recurrent, moderate: Secondary | ICD-10-CM | POA: Diagnosis not present

## 2024-04-15 DIAGNOSIS — G4700 Insomnia, unspecified: Secondary | ICD-10-CM

## 2024-04-15 DIAGNOSIS — F4010 Social phobia, unspecified: Secondary | ICD-10-CM | POA: Diagnosis not present

## 2024-04-15 NOTE — Patient Instructions (Signed)
 Continue bupropion  150 mg daily ( Continue citalopram  20 mg daily  Continue mirtazapine  15 mg at night Continue Ambien  5 mg  every 3  night as needed for insomnia Referred to therapy (wait list) Next appointment: 6/23 at 3 pm.
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
## 2024-04-17 DIAGNOSIS — R0600 Dyspnea, unspecified: Principal | ICD-10-CM

## 2024-04-18 ENCOUNTER — Inpatient Hospital Stay
Admission: EM | Admit: 2024-04-18 | Discharge: 2024-04-22 | DRG: 433 | Disposition: A | Discharge status: Home Health | Attending: Internal Medicine | Admitting: Internal Medicine

## 2024-04-18 ENCOUNTER — Emergency Department

## 2024-04-18 ENCOUNTER — Other Ambulatory Visit: Payer: Self-pay

## 2024-04-18 DIAGNOSIS — D649 Anemia, unspecified: Secondary | ICD-10-CM | POA: Diagnosis present

## 2024-04-18 DIAGNOSIS — E871 Hypo-osmolality and hyponatremia: Secondary | ICD-10-CM | POA: Diagnosis present

## 2024-04-18 DIAGNOSIS — J90 Pleural effusion, not elsewhere classified: Secondary | ICD-10-CM | POA: Diagnosis present

## 2024-04-18 DIAGNOSIS — F32A Depression, unspecified: Secondary | ICD-10-CM | POA: Diagnosis present

## 2024-04-18 DIAGNOSIS — Z7984 Long term (current) use of oral hypoglycemic drugs: Secondary | ICD-10-CM

## 2024-04-18 DIAGNOSIS — F419 Anxiety disorder, unspecified: Secondary | ICD-10-CM | POA: Diagnosis present

## 2024-04-18 DIAGNOSIS — I10 Essential (primary) hypertension: Secondary | ICD-10-CM | POA: Diagnosis present

## 2024-04-18 DIAGNOSIS — Z833 Family history of diabetes mellitus: Secondary | ICD-10-CM

## 2024-04-18 DIAGNOSIS — E669 Obesity, unspecified: Secondary | ICD-10-CM | POA: Diagnosis present

## 2024-04-18 DIAGNOSIS — E861 Hypovolemia: Secondary | ICD-10-CM | POA: Diagnosis present

## 2024-04-18 DIAGNOSIS — E1142 Type 2 diabetes mellitus with diabetic polyneuropathy: Secondary | ICD-10-CM | POA: Diagnosis present

## 2024-04-18 DIAGNOSIS — Z9049 Acquired absence of other specified parts of digestive tract: Secondary | ICD-10-CM

## 2024-04-18 DIAGNOSIS — N390 Urinary tract infection, site not specified: Secondary | ICD-10-CM | POA: Diagnosis present

## 2024-04-18 DIAGNOSIS — K7031 Alcoholic cirrhosis of liver with ascites: Principal | ICD-10-CM | POA: Diagnosis present

## 2024-04-18 DIAGNOSIS — Z8249 Family history of ischemic heart disease and other diseases of the circulatory system: Secondary | ICD-10-CM

## 2024-04-18 DIAGNOSIS — E1165 Type 2 diabetes mellitus with hyperglycemia: Secondary | ICD-10-CM | POA: Diagnosis present

## 2024-04-18 DIAGNOSIS — R1011 Right upper quadrant pain: Secondary | ICD-10-CM

## 2024-04-18 DIAGNOSIS — G4733 Obstructive sleep apnea (adult) (pediatric): Secondary | ICD-10-CM | POA: Diagnosis present

## 2024-04-18 DIAGNOSIS — Z8542 Personal history of malignant neoplasm of other parts of uterus: Secondary | ICD-10-CM

## 2024-04-18 DIAGNOSIS — Z6829 Body mass index (BMI) 29.0-29.9, adult: Secondary | ICD-10-CM

## 2024-04-18 DIAGNOSIS — Z794 Long term (current) use of insulin: Secondary | ICD-10-CM

## 2024-04-18 DIAGNOSIS — R634 Abnormal weight loss: Secondary | ICD-10-CM | POA: Diagnosis present

## 2024-04-18 DIAGNOSIS — J9811 Atelectasis: Secondary | ICD-10-CM | POA: Diagnosis present

## 2024-04-18 DIAGNOSIS — R1084 Generalized abdominal pain: Secondary | ICD-10-CM | POA: Diagnosis present

## 2024-04-18 DIAGNOSIS — E785 Hyperlipidemia, unspecified: Secondary | ICD-10-CM | POA: Diagnosis present

## 2024-04-18 DIAGNOSIS — I252 Old myocardial infarction: Secondary | ICD-10-CM

## 2024-04-18 DIAGNOSIS — Z90710 Acquired absence of both cervix and uterus: Secondary | ICD-10-CM

## 2024-04-18 DIAGNOSIS — I951 Orthostatic hypotension: Secondary | ICD-10-CM | POA: Diagnosis present

## 2024-04-18 DIAGNOSIS — Z818 Family history of other mental and behavioral disorders: Secondary | ICD-10-CM

## 2024-04-18 DIAGNOSIS — Z7982 Long term (current) use of aspirin: Secondary | ICD-10-CM | POA: Diagnosis not present

## 2024-04-18 DIAGNOSIS — Z821 Family history of blindness and visual loss: Secondary | ICD-10-CM

## 2024-04-18 DIAGNOSIS — K7581 Nonalcoholic steatohepatitis (NASH): Secondary | ICD-10-CM | POA: Diagnosis present

## 2024-04-18 DIAGNOSIS — A0811 Acute gastroenteropathy due to Norwalk agent: Secondary | ICD-10-CM | POA: Diagnosis present

## 2024-04-18 DIAGNOSIS — Z823 Family history of stroke: Secondary | ICD-10-CM

## 2024-04-18 DIAGNOSIS — I445 Left posterior fascicular block: Secondary | ICD-10-CM | POA: Diagnosis present

## 2024-04-18 DIAGNOSIS — Z79899 Other long term (current) drug therapy: Secondary | ICD-10-CM

## 2024-04-18 DIAGNOSIS — R188 Other ascites: Secondary | ICD-10-CM

## 2024-04-18 LAB — CBC
HCT: 29.7 % — ABNORMAL LOW (ref 36.0–46.0)
Hemoglobin: 8.8 g/dL — ABNORMAL LOW (ref 12.0–15.0)
MCH: 22.4 pg — ABNORMAL LOW (ref 26.0–34.0)
MCHC: 29.6 g/dL — ABNORMAL LOW (ref 30.0–36.0)
MCV: 75.8 fL — ABNORMAL LOW (ref 80.0–100.0)
Platelets: 173 10*3/uL (ref 150–400)
RBC: 3.92 MIL/uL (ref 3.87–5.11)
RDW: 22.5 % — ABNORMAL HIGH (ref 11.5–15.5)
WBC: 4.3 10*3/uL (ref 4.0–10.5)
nRBC: 0 % (ref 0.0–0.2)

## 2024-04-18 LAB — COMPREHENSIVE METABOLIC PANEL WITH GFR
ALT: 20 U/L (ref 0–44)
AST: 44 U/L — ABNORMAL HIGH (ref 15–41)
Albumin: 2.5 g/dL — ABNORMAL LOW (ref 3.5–5.0)
Alkaline Phosphatase: 323 U/L — ABNORMAL HIGH (ref 38–126)
Anion gap: 6 (ref 5–15)
BUN: 17 mg/dL (ref 6–20)
CO2: 27 mmol/L (ref 22–32)
Calcium: 8.8 mg/dL — ABNORMAL LOW (ref 8.9–10.3)
Chloride: 96 mmol/L — ABNORMAL LOW (ref 98–111)
Creatinine, Ser: 0.78 mg/dL (ref 0.44–1.00)
GFR, Estimated: >60 mL/min (ref >60)
Glucose, Bld: 237 mg/dL — ABNORMAL HIGH (ref 70–99)
Potassium: 3.6 mmol/L (ref 3.5–5.1)
Sodium: 129 mmol/L — ABNORMAL LOW (ref 135–145)
Total Bilirubin: 4.1 mg/dL — ABNORMAL HIGH (ref 0.0–1.2)
Total Protein: 7.8 g/dL (ref 6.5–8.1)

## 2024-04-18 LAB — URINALYSIS, ROUTINE W REFLEX MICROSCOPIC
Bilirubin Urine: NEGATIVE
Glucose, UA: >=500 mg/dL — AB
Ketones, ur: NEGATIVE mg/dL
Nitrite: NEGATIVE
Protein, ur: NEGATIVE mg/dL
Specific Gravity, Urine: 1.009 (ref 1.005–1.030)
pH: 5 (ref 5.0–8.0)

## 2024-04-18 LAB — LIPASE, BLOOD: Lipase: 101 U/L — ABNORMAL HIGH (ref 11–51)

## 2024-04-18 LAB — PROTIME-INR
INR: 1.5 — ABNORMAL HIGH (ref 0.8–1.2)
Prothrombin Time: 18.3 s — ABNORMAL HIGH (ref 11.4–15.2)

## 2024-04-18 LAB — TROPONIN I (HIGH SENSITIVITY): Troponin I (High Sensitivity): 7 ng/L (ref <18)

## 2024-04-18 MED ORDER — LIDOCAINE HCL (PF) 1 % IJ SOLN
INTRAMUSCULAR | Status: AC
  Administered 2024-04-18: 5 mL
  Filled 2024-04-18: qty 5

## 2024-04-18 MED ORDER — IOHEXOL 300 MG/ML  SOLN
75.0000 mL | Freq: Once | INTRAMUSCULAR | Status: AC | PRN
  Administered 2024-04-18: 75 mL via INTRAVENOUS

## 2024-04-18 MED ORDER — MORPHINE SULFATE (PF) 4 MG/ML IV SOLN
4.0000 mg | Freq: Once | INTRAVENOUS | Status: AC
  Administered 2024-04-18: 4 mg via INTRAVENOUS
  Filled 2024-04-18: qty 1

## 2024-04-18 MED ORDER — ONDANSETRON HCL 4 MG/2ML IJ SOLN
4.0000 mg | Freq: Once | INTRAMUSCULAR | Status: AC
  Administered 2024-04-18: 4 mg via INTRAVENOUS
  Filled 2024-04-18: qty 2

## 2024-04-18 MED ORDER — SODIUM CHLORIDE 0.9 % IV BOLUS
1000.0000 mL | Freq: Once | INTRAVENOUS | Status: AC
  Administered 2024-04-18: 1000 mL via INTRAVENOUS

## 2024-04-18 NOTE — ED Provider Notes (Signed)
 [facility] Provider Note    Event Date/Time   First MD Initiated Contact with Patient 04/18/24 1932     (approximate)   History   Shortness of Breath, Diarrhea, and Abdominal Pain   HPI  Abigail Molina is a 58 y.o. female past medical history significant for [name] and liver cirrhosis, recent Klebsiella bacteremia and septic shock, hypertension, hyperlipidemia, obesity, presents to the emergency department with abdominal pain and diarrhea.  States that she has been having diarrhea for the past 5 to 6 days.  Watery diarrhea with no blood.  Does state that she had some blood in her stool after she had banding done from her variceals that had ruptured with Dr. Danise Durie.  Endorses right sided abdominal pain and right lower abdominal pain.  States that it does radiate some to the left side.  Chills but denies any fever.  No recent travel.  Recent hospitalizations.  Prior cholecystectomy and hysterectomy.  On chart review patient had a recent hospitalization from Klebsiella bacteremia.  Patient also has a history of esophageal varices that needed banding.  Has had a hemoglobin that had been stable.  Does have a history of ascites.     Physical Exam   Triage Vital Signs: ED Triage Vitals  Encounter Vitals Group     BP 04/18/24 1730 (!) 104/51     Systolic BP Percentile --      Diastolic BP Percentile --      Pulse Rate 04/18/24 1730 86     Resp 04/18/24 1730 18     Temp 04/18/24 1730 98.2 F (36.8 C)     Temp Source 04/18/24 1730 Oral     SpO2 04/18/24 1730 99 %     Weight 04/18/24 1727 170 lb (77.1 kg)     Height 04/18/24 1727 5\' 4"  (1.626 m)     Head Circumference --      Peak Flow --      Pain Score 04/18/24 1739 5     Pain Loc --      Pain Education --      Exclude from Growth Chart --     Most recent vital signs: Vitals:   04/18/24 2100 04/18/24 2220  BP: (!) 101/51   Pulse: 84   Resp: 18   Temp:  97.9 F (36.6 C)  SpO2: 99%     Physical  Exam Constitutional:      Appearance: She is well-developed.  HENT:     Head: Atraumatic.  Eyes:     Conjunctiva/sclera: Conjunctivae normal.  Cardiovascular:     Rate and Rhythm: Regular rhythm.  Pulmonary:     Effort: No respiratory distress.  Chest:     Chest wall: No tenderness.  Abdominal:     General: There is no distension.     Tenderness: There is abdominal tenderness.     Comments: Right-sided abdominal tenderness to palpation with no rebound or guarding  Musculoskeletal:        General: Normal range of motion.     Cervical back: Normal range of motion.  Skin:    General: Skin is warm.     Capillary Refill: Capillary refill takes less than 2 seconds.  Neurological:     Mental Status: She is alert. Mental status is at baseline.     IMPRESSION / MDM / ASSESSMENT AND PLAN / ED COURSE  I reviewed the triage vital signs and the nursing notes.  Differential diagnosis including GI bleed, C.  difficile, viral gastroenteritis, diverticulitis, acute appendicitis, UTI, kidney stone, thrombosis  EKG  I, Viviano Ground, the attending physician, personally viewed and interpreted this ECG.   Rate: Normal  Rhythm: Normal sinus  Axis: Normal  Intervals: Normal  ST&T Change: None  No tachycardic or bradycardic dysrhythmias while on cardiac telemetry.  RADIOLOGY I independently reviewed imaging, my interpretation of imaging: CT scan abdomen and pelvis with contrast  LABS (all labs ordered are listed, but only abnormal results are displayed) Labs interpreted as -    Labs Reviewed  LIPASE, BLOOD - Abnormal; Notable for the following components:      Result Value   Lipase 101 (*)    All other components within normal limits  COMPREHENSIVE METABOLIC PANEL WITH GFR - Abnormal; Notable for the following components:   Sodium 129 (*)    Chloride 96 (*)    Glucose, Bld 237 (*)    Calcium  8.8 (*)    Albumin  2.5 (*)    AST 44 (*)    Alkaline Phosphatase 323 (*)    Total  Bilirubin 4.1 (*)    All other components within normal limits  CBC - Abnormal; Notable for the following components:   Hemoglobin 8.8 (*)    HCT 29.7 (*)    MCV 75.8 (*)    MCH 22.4 (*)    MCHC 29.6 (*)    RDW 22.5 (*)    All other components within normal limits  URINALYSIS, ROUTINE W REFLEX MICROSCOPIC - Abnormal; Notable for the following components:   Color, Urine YELLOW (*)    APPearance HAZY (*)    Glucose, UA >=500 (*)    Hgb urine dipstick SMALL (*)    Leukocytes,Ua LARGE (*)    Bacteria, UA RARE (*)    All other components within normal limits  PROTIME-INR - Abnormal; Notable for the following components:   Prothrombin Time 18.3 (*)    INR 1.5 (*)    All other components within normal limits  URINE CULTURE  C DIFFICILE QUICK SCREEN W PCR REFLEX    GASTROINTESTINAL PANEL BY PCR, STOOL (REPLACES STOOL CULTURE)  BODY FLUID CULTURE W GRAM STAIN  BODY FLUID CELL COUNT WITH DIFFERENTIAL  TROPONIN I (HIGH SENSITIVITY)     MDM  On arrival afebrile, soft blood pressure 100/51.  99% on room air.  Lab work with hyponatremia that appears chronic.  Hyperglycemia but no signs of DKA.  Does have a chronically elevated T. bili, today it is 4.1 which is actually lower than her previous.  Alk phos is elevated as well as AST.  Low albumin  at 2.5.  No leukocytosis.  Hemoglobin is stable at 8.8.  Normal platelets.  UA without an obvious infectious process large leukocyte esterase and does have some WBCs but rare bacteria and RBCs.  Urine was sent for culture.  Given IV fluids, antiemetics and pain medication.  CT scan ordered for further evaluation.  CT scan read as changes of cirrhosis with splenomegaly and large volume ascites.  Pleural effusions.  Compressive atelectasis.  Bedside diagnostic paracentesis obtained.  Concern for possible SBP.  Did have some improvement of her pain following pain medication.  Discussed with hospitalist for admission for further workup of the large  volume ascites and SBP evaluation.     PROCEDURES:  Critical Care performed: No  Paracentesis  Date/Time: 04/18/2024 11:54 PM  Performed by: Viviano Ground, MD Authorized by: Viviano Ground, MD   Consent:    Consent obtained:  Verbal and written  Consent given by:  Patient   Risks, benefits, and alternatives were discussed: yes     Risks discussed:  Bleeding, bowel perforation, infection and pain   Alternatives discussed:  Delayed treatment and alternative treatment Universal protocol:    Procedure explained and questions answered to patient or proxy's satisfaction: yes     Relevant documents present and verified: yes     Test results available: yes     Imaging studies available: yes     Required blood products, implants, devices, and special equipment available: yes     Site/side marked: yes     Immediately prior to procedure, a time out was called: yes     Patient identity confirmed:  Verbally with patient, hospital-assigned identification number, anonymous protocol, patient vented/unresponsive and arm band Pre-procedure details:    Procedure purpose:  Diagnostic   Preparation: Patient was prepped and draped in usual sterile fashion   Anesthesia:    Anesthesia method:  Local infiltration   Local anesthetic:  Lidocaine  1% w/o epi Procedure details:    Needle gauge:  20   Ultrasound guidance: yes     Puncture site:  L lower quadrant   Fluid removed amount:  20 cc   Fluid appearance:  Clear   Dressing:  4x4 sterile gauze and adhesive bandage Post-procedure details:    Procedure completion:  Tolerated   Patient's presentation is most consistent with acute presentation with potential threat to life or bodily function.   MEDICATIONS ORDERED IN ED: Medications  sodium chloride  0.9 % bolus 1,000 mL (1,000 mLs Intravenous New Bag/Given 04/18/24 2218)  morphine  (PF) 4 MG/ML injection 4 mg (4 mg Intravenous Given 04/18/24 2216)  ondansetron  (ZOFRAN ) injection 4 mg (4 mg  Intravenous Given 04/18/24 2215)  iohexol  (OMNIPAQUE ) 300 MG/ML solution 75 mL (75 mLs Intravenous Contrast Given 04/18/24 2155)  lidocaine  (PF) (XYLOCAINE ) 1 % injection (5 mLs  Given by Other 04/18/24 2348)    FINAL CLINICAL IMPRESSION(S) / ED DIAGNOSES   Final diagnoses:  Generalized abdominal pain  Other ascites     Rx / DC Orders   ED Discharge Orders     None        Note:  This document was prepared using Dragon voice recognition software and may include unintentional dictation errors.   Viviano Ground, MD 04/18/24 2356

## 2024-04-18 NOTE — ED Triage Notes (Addendum)
 Pt to ED via [facility] from home. Pt reports SOB x5 days, diarrhea and abd pain x1wk and chills that started today. Pt abd distended. Pt appears jaundiced. Pt reports hx of cirrhosis and needing paracentesis. Pt reports admitted 3wks ago for esophageal varices. Pt also reports 40lb unintentional weight loss over 3wks. Pt is on fluid pill.

## 2024-04-18 NOTE — H&P (Addendum)
 [place]   PATIENT NAME: Abigail Molina    MR#:  [id number]  DATE OF BIRTH:  [birth year]  DATE OF ADMISSION:  04/18/2024  PRIMARY CARE PHYSICIAN: [facility]   Patient is coming from: Home  REQUESTING/REFERRING PHYSICIAN: Viviano Ground, MD  CHIEF COMPLAINT:   Chief Complaint  Patient presents with  . Shortness of Breath  . Diarrhea  . Abdominal Pain    HISTORY OF PRESENT ILLNESS:  Abigail Molina is a 58 y.o. caging female with medical history significant for type 2 diabetes mellitus, psoriasis, anxiety, depression, osteoarthritis, NASH and liver cirrhosis with ascites, GERD, hypertension, dyslipidemia and OSA, who presented to the emergency room with acute onset of abdominal pain and diarrhea over the last 5 to 6 days.  She has been having watery bowel movements with no blood.  She had some blood in her stools after having banding of her varices that had ruptured.  Her pain has been in the right upper quadrant as well as the right lower quadrant and radiates to her left side.  She admits to chills without measured fever.  She has been having associated dyspnea.  No chest pain or palpitations.  She admits to mild dry cough and occasional wheezing.  No nausea or or vomiting.  No dysuria, oliguria or hematuria or flank pain.  She had recent hospitalization for Klebsiella bacteremia beside recent esophageal varices requiring banding.  ED Course: When she came to the ER, vital signs were within normal.  Labs revealed hyponatremia 129 compared to 131, hypochloremia 96 compared to 97 on 4/20//25 and glucose of 237 with calcium  of 8.8.  Alk phos was 323 with albumin  2.5 and troponin 7.8.  Lipase was 101 with AST 44 ALT 20.  Total bili was 4.1 CBC showed anemia at baseline with hemoglobin 8.8 and hematocrit 29.7 with microcytosis.  INR was 1.5 and PT 18.3.. EKG as reviewed by me : EKG showed normal sinus rhythm with a rate of 86 with premature supraventricular complexes and left  posterior fascicular block with poor R wave progression and inferior Q waves. Imaging: Abdominal and pelvic CT scan with contrast revealed the following: Changes of cirrhosis with splenomegaly and large volume ascites.   Small to moderate left pleural effusion, increased since prior study. Compressive atelectasis in the left lower lobe.   Coronary artery disease, aortic atherosclerosis.   The patient was given 4 mg of IV morphine  sulfate, 4 mg of IV Zofran  and 1 L bolus of IV normal saline.  She underwent diagnostic paracentesis.  Cell count was 261 with 36 neutrophils and 42 monocytes and 2 neutrophils.  The patient will be admitted to a medical-surgical bed for further evaluation and management PAST MEDICAL HISTORY:   Past Medical History:  Diagnosis Date  . Anemia   . Anxiety   . Arthritis   . Cancer [facility] [place])    Endometrial cancer  . Chronic pain   . Cirrhosis of liver (HCC)   . Depression   . Diabetes mellitus without complication (HCC)   . Fatty liver disease, nonalcoholic 2021  . GERD (gastroesophageal reflux disease)   . Glaucoma   . Hyperlipidemia   . Hypertension   . Meningioma (HCC)   . NSTEMI (non-ST elevated myocardial infarction) (HCC) 02/03/2024  . OSA (obstructive sleep apnea) 05/08/2023  . Overactive bladder   . Pharmacologic therapy 11/02/2023  . Psoriasis 2015    PAST SURGICAL HISTORY:   Past Surgical History:  Procedure Laterality Date  .  ABDOMINAL HYSTERECTOMY  2007  . CHOLECYSTECTOMY  2000  . ESOPHAGOGASTRODUODENOSCOPY N/A 03/22/2024   Procedure: EGD (ESOPHAGOGASTRODUODENOSCOPY);  Surgeon: [name], MD;  Location: [facility] ENDOSCOPY;  Service: Endoscopy;  Laterality: N/A;    SOCIAL HISTORY:   Social History   Tobacco Use  . Smoking status: Never  . Smokeless tobacco: Never  Substance Use Topics  . Alcohol  use: Not Currently    Comment:  Rarely. One or two drinks a year     FAMILY HISTORY:   Family History  Problem Relation Age of Onset  .  Diabetes Mother   . Hypertension Mother   . Depression Mother   . Obesity Mother   . Vision loss Mother   . Alcohol  abuse Cousin   . Bipolar disorder Cousin   . Heart disease Maternal Grandfather   . Alcohol  abuse Maternal Grandmother   . Stroke Paternal Grandfather     DRUG ALLERGIES:  No Known Allergies  REVIEW OF SYSTEMS:   ROS As per history of present illness. All pertinent systems were reviewed above. Constitutional, HEENT, cardiovascular, respiratory, GI, GU, musculoskeletal, neuro, psychiatric, endocrine, integumentary and hematologic systems were reviewed and are otherwise negative/unremarkable except for positive findings mentioned above in the HPI.   MEDICATIONS AT HOME:   Prior to Admission medications   Medication Sig Start Date End Date Taking? Authorizing Provider  Accu-Chek Softclix Lancets lancets SMARTSIG:Topical    [provider]  aspirin  81 MG tablet Take 81 mg by mouth daily.    [provider]  Baclofen  5 MG TABS Take 2 tablets by mouth at bedtime.    [provider]  BD PEN NEEDLE NANO 2ND GEN 32G X 4 MM MISC USE 1 NIGHTLY 09/28/23   [provider]  buPROPion  (WELLBUTRIN  XL) 150 MG 24 hr tablet Take 1 tablet (150 mg total) by mouth daily. 11/16/23 03/17/24  Todd Fossa, MD  canagliflozin (INVOKANA) 300 MG TABS tablet Take 300 mg by mouth daily before breakfast.    [provider]  citalopram  (CELEXA ) 20 MG tablet Take 20 mg by mouth daily.    [provider]  Continuous Glucose Sensor (FREESTYLE LIBRE 3 SENSOR) MISC by Does not apply route daily.    [provider]  COSENTYX SENSOREADY, 300 MG, 150 MG/ML SOAJ Inject into the skin.    [provider]  Ferrous Sulfate  (SLOW FE) 142 (45 Fe) MG TBCR Take by mouth. 09/12/21   [provider]  furosemide  (LASIX ) 20 MG tablet Take 1 tablet (20 mg total) by mouth 2 (two) times daily. 03/23/24   Aisha Hove, MD  gabapentin   (NEURONTIN ) 300 MG capsule Take by mouth once. 06/02/23 06/01/24  [provider]  insulin  lispro (HUMALOG) 100 UNIT/ML injection Inject 8 Units into the skin 3 (three) times daily with meals.    [provider]  Insulin  Pen Needle 32G X 6 MM MISC 1 Syringe by Does not apply route. Use with Victoza .    [provider]  lactulose  (CHRONULAC ) 10 GM/15ML solution Take 45 mLs (30 g total) by mouth 2 (two) times daily as needed for mild constipation or moderate constipation (titrate for 2-3 stools/day). 03/23/24   Aisha Hove, MD  LANTUS  SOLOSTAR 100 UNIT/ML Solostar Pen Inject 30 Units into the skin daily. 03/23/24 05/24/25  Aisha Hove, MD  levETIRAcetam  (KEPPRA ) 500 MG tablet Take 750 mg by mouth 2 (two) times daily. 01/04/24 07/02/24  [provider]  MAGNESIUM -OXIDE 400 (240 Mg) MG tablet Take 2 tablets  by mouth 2 (two) times daily. 10/14/23   [provider]  metFORMIN  (GLUCOPHAGE -XR) 500 MG 24 hr tablet Take 1,000 mg by mouth 2 (two) times daily.    [provider]  midodrine  (PROAMATINE ) 10 MG tablet Take 1 tablet (10 mg total) by mouth 3 (three) times daily with meals. 03/23/24   Aisha Hove, MD  mirtazapine  (REMERON ) 15 MG tablet Take 1 tablet (15 mg total) by mouth at bedtime. 04/08/24 05/08/24  Hisada, Reina, MD  Multiple Vitamin (MULTIVITAMIN WITH MINERALS) TABS tablet Take 1 tablet by mouth daily. 03/23/24   Aisha Hove, MD  naltrexone (DEPADE) 50 MG tablet Take 50 mg by mouth daily.    [provider]  ondansetron  (ZOFRAN -ODT) 4 MG disintegrating tablet Dissolve 1 tablet (4 mg total) in the mouth every twelve (12) hours as needed for nausea. 10/05/23 10/04/24  [provider]  pantoprazole  (PROTONIX ) 40 MG tablet TAKE ONE TABLET BY MOUTH EVERY DAY 10/24/19   McGowan, Cathleen Coach A, PA-C  promethazine (PHENERGAN) 12.5 MG tablet Take by mouth. 05/23/22   [provider]  rosuvastatin  (CRESTOR ) 20  MG tablet Take 1 tablet (20 mg total) by mouth daily. 02/09/24   Donaciano Frizzle, MD  Sharps Container (BD SHARPS COLLECTOR) MISC Use as directed to dispose of Cosentyx pens. 10/05/23   [provider]  spironolactone  (ALDACTONE ) 50 MG tablet Take 1 tablet (50 mg total) by mouth 2 (two) times daily. 03/23/24 06/21/24  Aisha Hove, MD  thiamine  (VITAMIN B-1) 100 MG tablet Take 1 tablet (100 mg total) by mouth daily. 03/23/24   Aisha Hove, MD  XIFAXAN  550 MG TABS tablet Take 550 mg by mouth 2 (two) times daily.    [provider]  Zinc  Sulfate 220 (50 Zn) MG TABS Take 1 tablet (220 mg total) by mouth daily. 03/23/24   Aisha Hove, MD      VITAL SIGNS:  Blood pressure (!) 98/51, pulse 77, temperature 97.9 F (36.6 C), temperature source Oral, resp. rate 18, height 5\' 4"  (1.626 m), weight 77.1 kg, last menstrual period 10/15/2016, SpO2 99%.  PHYSICAL EXAMINATION:  Physical Exam  GENERAL:  58 y.o.-year-old patient lying in the bed with no acute distress.  EYES: Pupils equal, round, reactive to light and accommodation. No scleral icterus. Extraocular muscles intact.  HEENT: Head atraumatic, normocephalic. Oropharynx and nasopharynx clear.  NECK:  Supple, no jugular venous distention. No thyroid enlargement, no tenderness.  LUNGS: Normal breath sounds bilaterally, no wheezing, rales,rhonchi or crepitation. No use of accessory muscles of respiration.  CARDIOVASCULAR: Regular rate and rhythm, S1, S2 normal. No murmurs, rubs, or gallops.  ABDOMEN: Soft, distended with right upper and lower quadrant tenderness without rebound tenderness guarding or rigidity.  She had positive shifting dullness.. Bowel sounds present. No organomegaly or mass data sets.  EXTREMITIES: No pedal edema, cyanosis, or clubbing.  NEUROLOGIC: Cranial nerves II through XII are intact. Muscle strength 5/5 in all extremities. Sensation intact. Gait not checked.  PSYCHIATRIC: The patient is  alert and oriented x 3.  Normal affect and good eye contact. SKIN: No obvious rash, lesion, or ulcer.   LABORATORY PANEL:   CBC Recent Labs  Lab 04/18/24 1732  WBC 4.3  HGB 8.8*  HCT 29.7*  PLT 173   ------------------------------------------------------------------------------------------------------------------  Chemistries  Recent Labs  Lab 04/18/24 1732  NA 129*  K 3.6  CL 96*  CO2 27  GLUCOSE 237*  BUN 17  CREATININE 0.78  CALCIUM  8.8*  AST 44*  ALT 20  ALKPHOS 323*  BILITOT 4.1*   ------------------------------------------------------------------------------------------------------------------  Cardiac Enzymes No results for input(s): "TROPONINI" in the last 168 hours. ------------------------------------------------------------------------------------------------------------------  RADIOLOGY:  CT ABDOMEN PELVIS W CONTRAST Result Date: 04/18/2024 CLINICAL DATA:  Right lower quadrant pain EXAM: CT ABDOMEN AND PELVIS WITH CONTRAST TECHNIQUE: Multidetector CT imaging of the abdomen and pelvis was performed using the standard protocol following bolus administration of intravenous contrast. RADIATION DOSE REDUCTION: This exam was performed according to the departmental dose-optimization program which includes automated exposure control, adjustment of the mA and/or kV according to patient size and/or use of iterative reconstruction technique. CONTRAST:  75mL OMNIPAQUE  IOHEXOL  300 MG/ML  SOLN COMPARISON:  01/26/2024 FINDINGS: Lower chest: Small moderate left pleural effusion, slightly increased since prior study. Compressive atelectasis in the left lower lobe. Right lung base clear. Diffuse 3 vessel coronary artery disease. Hepatobiliary: Severely nodular shrunken liver compatible with cirrhosis. Prior cholecystectomy. No focal hepatic abnormality or biliary ductal dilatation. Pancreas: No focal abnormality or ductal dilatation. Spleen: Splenomegaly with a craniocaudal length  of 18.7 cm. Scattered subcentimeter hypodensities throughout the spleen, similar prior study. Adrenals/Urinary Tract: No adrenal abnormality. No focal renal abnormality. No stones or hydronephrosis. Urinary bladder is unremarkable. Stomach/Bowel: Stomach, large and small bowel grossly unremarkable. Vascular/Lymphatic: Aortic atherosclerosis. No evidence of aneurysm or adenopathy. Reproductive: Prior hysterectomy.  No adnexal masses. Other: Large volume ascites Musculoskeletal: No acute bony abnormality. IMPRESSION: Changes of cirrhosis with splenomegaly and large volume ascites. Small to moderate left pleural effusion, increased since prior study. Compressive atelectasis in the left lower lobe. Coronary artery disease, aortic atherosclerosis. Electronically Signed   By: Janeece Mechanic M.D.   On: 04/18/2024 22:27      IMPRESSION AND PLAN:  Assessment and Plan: * Abdominal pain - This involves the right upper and lower quadrant. - She will be admitted to a medical-surgical bed. - Pain management to be provided. - IR consult to be obtained for therapeutic paracentesis.   Dyslipidemia - Will continue statin therapy.  Alcoholic cirrhosis of liver with ascites (HCC) - We will continue Lasix , Xifaxan . - IR consult to be obtained for paracentesis as mentioned above.  Essential hypertension - Will continue antihypertensive therapy.  Type 2 diabetes mellitus with peripheral neuropathy (HCC) - The patient will be placed on supplemental coverage with NovoLog . - Will continue basal coverage. - Will hold off metformin  and continue Invokana.   Anxiety and depression - Will continue Wellbutrin  XL and Celexa  as well as Remeron .    DVT prophylaxis: Lovenox . Advanced Care Planning:  Code Status: full code. Family Communication:  The plan of care was discussed in details with the patient (and family). I answered all questions. The patient agreed to proceed with the above mentioned plan. Further  management will depend upon hospital course. Disposition Plan: Back to previous home environment Consults called: Interventional radiology All the records are reviewed and case discussed with ED provider.  Status is: Inpatient  At the time of the admission, it appears that the appropriate admission status for this patient is inpatient.  This is judged to be reasonable and necessary in order to provide the required intensity of service to ensure the patient's safety given the presenting symptoms, physical exam findings and initial radiographic and laboratory data in the context of comorbid conditions.  The patient requires inpatient status due to high intensity of service, high risk of further deterioration and high frequency of surveillance required.  I certify that at the time of admission, it is my clinical judgment that the  patient will require inpatient hospital care extending more than 2 midnights.                            Dispo: The patient is from: Home              Anticipated d/c is to: Home              Patient currently is not medically stable to d/c.              Difficult to place patient: No  Virgene Griffin M.D on 04/19/2024 at 12:55 AM  [facility]   From 7 PM-7 AM, contact night-coverage [email or web address]  CC: Primary care physician; [facility]

## 2024-04-18 NOTE — ED Notes (Signed)
 Patient transported to CT

## 2024-04-18 NOTE — ED Provider Triage Note (Signed)
 Emergency Medicine Provider Triage Evaluation Note  Abigail Molina , a 58 y.o. female  was evaluated in triage.  Pt complains of SOB x 5 days and abdominal pain x 1 week. History of cirrhosis with frequent paracentesis procedures needed.  States unintentional weight loss of 40 IBS over the last 3 weeks. She states she was admitted recently for esophageal varices   Review of Systems  Positive:  Negative:   Physical Exam  BP (!) 104/51 (BP Location: Left Arm)   Pulse 86   Temp 98.2 F (36.8 C) (Oral)   Resp 18   Ht 5\' 4"  (1.626 m)   Wt 77.1 kg   LMP 10/15/2016   SpO2 99%   BMI 29.18 kg/m  Gen:   Awake, no distress. Mild jaundice skin  Resp:  Normal effort  MSK:   Moves extremities without difficulty  Other:  Abd distended. Tenderness to RUQ.   Medical Decision Making  Medically screening exam initiated at 5:42 PM.  Appropriate orders placed.  Abigail Molina was informed that the remainder of the evaluation will be completed by another provider, this initial triage assessment does not replace that evaluation, and the importance of remaining in the ED until their evaluation is complete.    Phyllis Breeze, Enid Maultsby A, PA-C 04/18/24 1757

## 2024-04-19 ENCOUNTER — Inpatient Hospital Stay

## 2024-04-19 DIAGNOSIS — K7031 Alcoholic cirrhosis of liver with ascites: Secondary | ICD-10-CM | POA: Diagnosis present

## 2024-04-19 DIAGNOSIS — E785 Hyperlipidemia, unspecified: Secondary | ICD-10-CM | POA: Diagnosis present

## 2024-04-19 DIAGNOSIS — E1142 Type 2 diabetes mellitus with diabetic polyneuropathy: Secondary | ICD-10-CM | POA: Diagnosis present

## 2024-04-19 DIAGNOSIS — F32A Depression, unspecified: Secondary | ICD-10-CM | POA: Diagnosis present

## 2024-04-19 DIAGNOSIS — I10 Essential (primary) hypertension: Secondary | ICD-10-CM | POA: Diagnosis present

## 2024-04-19 LAB — C DIFFICILE QUICK SCREEN W PCR REFLEX
C Diff antigen: NEGATIVE
C Diff interpretation: NOT DETECTED
C Diff toxin: NEGATIVE

## 2024-04-19 LAB — BASIC METABOLIC PANEL WITH GFR
Anion gap: 8 (ref 5–15)
BUN: 16 mg/dL (ref 6–20)
CO2: 23 mmol/L (ref 22–32)
Calcium: 8.4 mg/dL — ABNORMAL LOW (ref 8.9–10.3)
Chloride: 101 mmol/L (ref 98–111)
Creatinine, Ser: 0.61 mg/dL (ref 0.44–1.00)
GFR, Estimated: >60 mL/min (ref >60)
Glucose, Bld: 122 mg/dL — ABNORMAL HIGH (ref 70–99)
Potassium: 3.8 mmol/L (ref 3.5–5.1)
Sodium: 132 mmol/L — ABNORMAL LOW (ref 135–145)

## 2024-04-19 LAB — CBC
HCT: 28.9 % — ABNORMAL LOW (ref 36.0–46.0)
Hemoglobin: 8.7 g/dL — ABNORMAL LOW (ref 12.0–15.0)
MCH: 22.7 pg — ABNORMAL LOW (ref 26.0–34.0)
MCHC: 30.1 g/dL (ref 30.0–36.0)
MCV: 75.5 fL — ABNORMAL LOW (ref 80.0–100.0)
Platelets: 165 10*3/uL (ref 150–400)
RBC: 3.83 MIL/uL — ABNORMAL LOW (ref 3.87–5.11)
RDW: 22.7 % — ABNORMAL HIGH (ref 11.5–15.5)
WBC: 3.5 10*3/uL — ABNORMAL LOW (ref 4.0–10.5)
nRBC: 0 % (ref 0.0–0.2)

## 2024-04-19 LAB — CBG MONITORING, ED
Glucose-Capillary: 125 mg/dL — ABNORMAL HIGH (ref 70–99)
Glucose-Capillary: 124 mg/dL — ABNORMAL HIGH (ref 70–99)
Glucose-Capillary: 201 mg/dL — ABNORMAL HIGH (ref 70–99)

## 2024-04-19 LAB — GLUCOSE, CAPILLARY
Glucose-Capillary: 168 mg/dL — ABNORMAL HIGH (ref 70–99)
Glucose-Capillary: 260 mg/dL — ABNORMAL HIGH (ref 70–99)

## 2024-04-19 LAB — GASTROINTESTINAL PANEL BY PCR, STOOL (REPLACES STOOL CULTURE)

## 2024-04-19 LAB — BODY FLUID CELL COUNT WITH DIFFERENTIAL
Eos, Fluid: 0 %
Lymphs, Fluid: 18 %
Monocyte-Macrophage-Serous Fluid: 78 %
Neutrophil Count, Fluid: 4 %
Total Nucleated Cell Count, Fluid: 244 uL

## 2024-04-19 LAB — HEMOGLOBIN A1C
Hgb A1c MFr Bld: 6.4 % — ABNORMAL HIGH (ref 4.8–5.6)
Mean Plasma Glucose: 136.98 mg/dL

## 2024-04-19 MED ORDER — OXYCODONE HCL 5 MG PO TABS
5.0000 mg | ORAL_TABLET | Freq: Four times a day (QID) | ORAL | Status: DC | PRN
  Administered 2024-04-19 – 2024-04-22 (×9): 5 mg via ORAL
  Filled 2024-04-19 (×9): qty 1

## 2024-04-19 MED ORDER — FUROSEMIDE 20 MG PO TABS
20.0000 mg | ORAL_TABLET | Freq: Two times a day (BID) | ORAL | Status: DC
  Administered 2024-04-19 – 2024-04-22 (×7): 20 mg via ORAL
  Filled 2024-04-19 (×7): qty 1

## 2024-04-19 MED ORDER — RIFAXIMIN 550 MG PO TABS
550.0000 mg | ORAL_TABLET | Freq: Two times a day (BID) | ORAL | Status: DC
  Administered 2024-04-19 – 2024-04-22 (×7): 550 mg via ORAL
  Filled 2024-04-19 (×7): qty 1

## 2024-04-19 MED ORDER — ACETAMINOPHEN 325 MG PO TABS
650.0000 mg | ORAL_TABLET | Freq: Four times a day (QID) | ORAL | Status: DC | PRN
  Administered 2024-04-20: 650 mg via ORAL
  Filled 2024-04-19: qty 2

## 2024-04-19 MED ORDER — BUPROPION HCL ER (XL) 150 MG PO TB24
150.0000 mg | ORAL_TABLET | Freq: Every day | ORAL | Status: DC
  Administered 2024-04-19 – 2024-04-22 (×4): 150 mg via ORAL
  Filled 2024-04-19 (×4): qty 1

## 2024-04-19 MED ORDER — MIRTAZAPINE 15 MG PO TABS
15.0000 mg | ORAL_TABLET | Freq: Every day | ORAL | Status: DC
  Administered 2024-04-19 – 2024-04-21 (×4): 15 mg via ORAL
  Filled 2024-04-19 (×4): qty 1

## 2024-04-19 MED ORDER — FLUCONAZOLE 50 MG PO TABS
150.0000 mg | ORAL_TABLET | Freq: Once | ORAL | Status: AC
  Administered 2024-04-19: 150 mg via ORAL
  Filled 2024-04-19: qty 1

## 2024-04-19 MED ORDER — ACETAMINOPHEN 650 MG RE SUPP: 650.0000 mg | Freq: Four times a day (QID) | RECTAL | Status: DC | PRN

## 2024-04-19 MED ORDER — LIDOCAINE HCL (PF) 1 % IJ SOLN
10.0000 mL | Freq: Once | INTRAMUSCULAR | Status: AC
  Administered 2024-04-19: 10 mL via INTRADERMAL
  Filled 2024-04-19: qty 10

## 2024-04-19 MED ORDER — SODIUM CHLORIDE 0.9 % IV BOLUS
500.0000 mL | Freq: Once | INTRAVENOUS | Status: AC
  Administered 2024-04-19: 500 mL via INTRAVENOUS

## 2024-04-19 MED ORDER — ADULT MULTIVITAMIN W/MINERALS CH
1.0000 | ORAL_TABLET | Freq: Every day | ORAL | Status: DC
  Administered 2024-04-19 – 2024-04-22 (×4): 1 via ORAL
  Filled 2024-04-19 (×4): qty 1

## 2024-04-19 MED ORDER — CITALOPRAM HYDROBROMIDE 20 MG PO TABS
20.0000 mg | ORAL_TABLET | Freq: Every day | ORAL | Status: DC
  Administered 2024-04-19 – 2024-04-22 (×4): 20 mg via ORAL
  Filled 2024-04-19 (×4): qty 1

## 2024-04-19 MED ORDER — MAGNESIUM OXIDE -MG SUPPLEMENT 400 (240 MG) MG PO TABS
800.0000 mg | ORAL_TABLET | Freq: Two times a day (BID) | ORAL | Status: DC
  Administered 2024-04-19 – 2024-04-22 (×8): 800 mg via ORAL
  Filled 2024-04-19 (×8): qty 2

## 2024-04-19 MED ORDER — SPIRONOLACTONE 25 MG PO TABS
50.0000 mg | ORAL_TABLET | Freq: Two times a day (BID) | ORAL | Status: DC
  Administered 2024-04-19 – 2024-04-22 (×7): 50 mg via ORAL
  Filled 2024-04-19 (×7): qty 2

## 2024-04-19 MED ORDER — SODIUM CHLORIDE 0.9 % IV SOLN
2.0000 g | INTRAVENOUS | Status: DC
  Administered 2024-04-19 – 2024-04-20 (×2): 2 g via INTRAVENOUS
  Filled 2024-04-19 (×2): qty 20

## 2024-04-19 MED ORDER — LACTULOSE 10 GM/15ML PO SOLN: 30.0000 g | Freq: Two times a day (BID) | ORAL | Status: DC | PRN

## 2024-04-19 MED ORDER — ROSUVASTATIN CALCIUM 10 MG PO TABS
20.0000 mg | ORAL_TABLET | Freq: Every day | ORAL | Status: DC
  Administered 2024-04-19 – 2024-04-22 (×4): 20 mg via ORAL
  Filled 2024-04-19: qty 1
  Filled 2024-04-19 (×3): qty 2

## 2024-04-19 MED ORDER — PANTOPRAZOLE SODIUM 40 MG PO TBEC
40.0000 mg | DELAYED_RELEASE_TABLET | Freq: Every day | ORAL | Status: DC
  Administered 2024-04-19 – 2024-04-22 (×4): 40 mg via ORAL
  Filled 2024-04-19 (×4): qty 1

## 2024-04-19 MED ORDER — INSULIN ASPART 100 UNIT/ML IJ SOLN
0.0000 [IU] | Freq: Every day | INTRAMUSCULAR | Status: DC
  Administered 2024-04-19 – 2024-04-20 (×2): 3 [IU] via SUBCUTANEOUS
  Administered 2024-04-21: 2 [IU] via SUBCUTANEOUS
  Filled 2024-04-19 (×3): qty 1

## 2024-04-19 MED ORDER — SODIUM CHLORIDE 0.9 % IV SOLN: INTRAVENOUS | Status: DC

## 2024-04-19 MED ORDER — LEVETIRACETAM 750 MG PO TABS
750.0000 mg | ORAL_TABLET | Freq: Two times a day (BID) | ORAL | Status: DC
  Administered 2024-04-19 – 2024-04-22 (×7): 750 mg via ORAL
  Filled 2024-04-19 (×8): qty 1

## 2024-04-19 MED ORDER — INSULIN ASPART 100 UNIT/ML IJ SOLN
0.0000 [IU] | Freq: Three times a day (TID) | INTRAMUSCULAR | Status: DC
  Administered 2024-04-19: 3 [IU] via SUBCUTANEOUS
  Administered 2024-04-19: 5 [IU] via SUBCUTANEOUS
  Administered 2024-04-19: 2 [IU] via SUBCUTANEOUS
  Administered 2024-04-20: 11 [IU] via SUBCUTANEOUS
  Administered 2024-04-20: 5 [IU] via SUBCUTANEOUS
  Administered 2024-04-21: 3 [IU] via SUBCUTANEOUS
  Administered 2024-04-21 (×2): 5 [IU] via SUBCUTANEOUS
  Administered 2024-04-22: 3 [IU] via SUBCUTANEOUS
  Administered 2024-04-22: 8 [IU] via SUBCUTANEOUS
  Filled 2024-04-19 (×10): qty 1

## 2024-04-19 MED ORDER — FERROUS SULFATE 325 (65 FE) MG PO TABS
325.0000 mg | ORAL_TABLET | Freq: Every day | ORAL | Status: DC
  Administered 2024-04-19 – 2024-04-22 (×4): 325 mg via ORAL
  Filled 2024-04-19 (×4): qty 1

## 2024-04-19 MED ORDER — ONDANSETRON HCL 4 MG PO TABS
4.0000 mg | ORAL_TABLET | Freq: Four times a day (QID) | ORAL | Status: DC | PRN
  Administered 2024-04-21: 4 mg via ORAL
  Filled 2024-04-19: qty 1

## 2024-04-19 MED ORDER — TRAZODONE HCL 50 MG PO TABS
25.0000 mg | ORAL_TABLET | Freq: Every evening | ORAL | Status: DC | PRN
  Administered 2024-04-19 – 2024-04-21 (×3): 25 mg via ORAL
  Filled 2024-04-19 (×3): qty 1

## 2024-04-19 MED ORDER — GABAPENTIN 300 MG PO CAPS
300.0000 mg | ORAL_CAPSULE | Freq: Every day | ORAL | Status: DC
  Administered 2024-04-19 – 2024-04-21 (×4): 300 mg via ORAL
  Filled 2024-04-19 (×4): qty 1

## 2024-04-19 MED ORDER — ZINC SULFATE 220 (50 ZN) MG PO CAPS
220.0000 mg | ORAL_CAPSULE | Freq: Every day | ORAL | Status: DC
  Administered 2024-04-19 – 2024-04-22 (×4): 220 mg via ORAL
  Filled 2024-04-19 (×4): qty 1

## 2024-04-19 MED ORDER — THIAMINE MONONITRATE 100 MG PO TABS
100.0000 mg | ORAL_TABLET | Freq: Every day | ORAL | Status: DC
  Administered 2024-04-19 – 2024-04-22 (×4): 100 mg via ORAL
  Filled 2024-04-19 (×5): qty 1

## 2024-04-19 MED ORDER — HALOPERIDOL LACTATE 5 MG/ML IJ SOLN: 1.0000 mg | Freq: Four times a day (QID) | INTRAMUSCULAR | Status: DC | PRN

## 2024-04-19 MED ORDER — BACLOFEN 10 MG PO TABS
10.0000 mg | ORAL_TABLET | Freq: Every day | ORAL | Status: DC
  Administered 2024-04-19 – 2024-04-21 (×4): 10 mg via ORAL
  Filled 2024-04-19 (×4): qty 1

## 2024-04-19 MED ORDER — ONDANSETRON HCL 4 MG/2ML IJ SOLN: 4.0000 mg | Freq: Four times a day (QID) | INTRAMUSCULAR | Status: DC | PRN

## 2024-04-19 MED ORDER — INSULIN GLARGINE-YFGN 100 UNIT/ML ~~LOC~~ SOLN
30.0000 [IU] | Freq: Every day | SUBCUTANEOUS | Status: DC
  Administered 2024-04-19 – 2024-04-21 (×3): 30 [IU] via SUBCUTANEOUS
  Filled 2024-04-19 (×3): qty 0.3

## 2024-04-19 MED ORDER — MIDODRINE HCL 5 MG PO TABS
10.0000 mg | ORAL_TABLET | Freq: Three times a day (TID) | ORAL | Status: DC
  Administered 2024-04-19 – 2024-04-22 (×10): 10 mg via ORAL
  Filled 2024-04-19 (×9): qty 2

## 2024-04-19 MED ORDER — EMPAGLIFLOZIN 10 MG PO TABS
10.0000 mg | ORAL_TABLET | Freq: Every day | ORAL | Status: DC
  Administered 2024-04-19 – 2024-04-22 (×4): 10 mg via ORAL
  Filled 2024-04-19 (×4): qty 1

## 2024-04-19 NOTE — ED Notes (Signed)
 Dr Achilles Holes sent message through secure chat:  "Pt is complaining of 9/10 abdominal pain. We gave her 4 mg of morphine  earlier but it has wore off now. She has PRN tylenol  ordered but refused it. Can we try something else?"

## 2024-04-19 NOTE — ED Notes (Signed)
 This RN received report from [facility] RN and performed care handoff. This RN introduced self to pt. Call light in reach, bed wheels locked, side rails raised, pt updated on plan of care. Rounding completed.

## 2024-04-19 NOTE — Plan of Care (Signed)
  Problem: Education: Goal: Ability to describe self-care measures that may prevent or decrease complications (Diabetes Survival Skills Education) will improve Outcome: Progressing Goal: Individualized Educational Video(s) Outcome: Progressing   Problem: Fluid Volume: Goal: Ability to maintain a balanced intake and output will improve Outcome: Progressing   Problem: Health Behavior/Discharge Planning: Goal: Ability to identify and utilize available resources and services will improve Outcome: Progressing Goal: Ability to manage health-related needs will improve Outcome: Progressing   Problem: Nutritional: Goal: Maintenance of adequate nutrition will improve Outcome: Progressing Goal: Progress toward achieving an optimal weight will improve Outcome: Progressing   Problem: Skin Integrity: Goal: Risk for impaired skin integrity will decrease Outcome: Progressing   Problem: Education: Goal: Knowledge of General Education information will improve Description: Including pain rating scale, medication(s)/side effects and non-pharmacologic comfort measures Outcome: Progressing   Problem: Health Behavior/Discharge Planning: Goal: Ability to manage health-related needs will improve Outcome: Progressing

## 2024-04-19 NOTE — ED Notes (Signed)
 MD Mansy notified and aware of pts BP of 97/45 with a MAP of 60. MD Mansy ordered a 500 mL bolus of fluid

## 2024-04-19 NOTE — ED Notes (Signed)
 Pt up to use restroom, stating she may need to have BM. Hats in commode to obtain sample.

## 2024-04-19 NOTE — ED Notes (Signed)
 Writer called and spoke with lab at this time. Phlebotomy to draw lab specimens.

## 2024-04-19 NOTE — ED Notes (Signed)
 Phlebotomist @ bedside

## 2024-04-19 NOTE — Assessment & Plan Note (Addendum)
-   Will continue Wellbutrin  XL and Celexa  as well as Remeron .

## 2024-04-19 NOTE — Assessment & Plan Note (Signed)
 Will continue statin therapy

## 2024-04-19 NOTE — Assessment & Plan Note (Signed)
Will continue antihypertensive therapy.

## 2024-04-19 NOTE — ED Notes (Signed)
Pt given diet ginger ale.

## 2024-04-19 NOTE — Assessment & Plan Note (Addendum)
-   The patient will be placed on supplemental coverage with NovoLog . - Will continue basal coverage. - Will hold off metformin  and continue Invokana.

## 2024-04-19 NOTE — Assessment & Plan Note (Signed)
-   We will continue Lasix , Xifaxan . - IR consult to be obtained for paracentesis as mentioned above.

## 2024-04-19 NOTE — ED Notes (Signed)
 Pt up to use restroom, steady gait no assistance required.

## 2024-04-19 NOTE — Progress Notes (Signed)
  Progress Note   Patient: Abigail Molina UEA:[id number] DOB: 10-26-1966 DOA: 04/18/2024     1 DOS: the patient was seen and examined on 04/19/2024   Brief hospital course:  "Abigail Molina is a 58 y.o. caging female with medical history significant for type 2 diabetes mellitus, psoriasis, anxiety, depression, osteoarthritis, NASH and liver cirrhosis with ascites, GERD, hypertension, dyslipidemia and OSA, who presented to the emergency room with acute onset of abdominal pain and diarrhea over the last 5 to 6 days. ..." See H&P for full HPI on admission & ED course.  Patient was admitted for further evaluation and management of abdominal pain due to recurrent ascites and UTI as outlined in detail below.    Assessment and Plan:  * Abdominal pain Recurrent Ascites - most recent paracentesis 03/22/24 here 3.6 L removed. 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  She reports abdominal distention is much like before her recent paracenteses.  She is agreeable to have another one.  She reports urinary frequency wihtout dysuria, fatigue, some weakness.  Physical Exam: Vitals:   04/19/24 1030 04/19/24 1530 04/19/24 1557 04/19/24 1636  BP: 130/63 (!) 113/53 (!) 99/44 (!)  110/56  Pulse: 77 75 73 73  Resp: 18 16 16 16   Temp:  97.7 F (36.5 C)    TempSrc:  Oral    SpO2: 94% 100% 99% 98%  Weight:      Height:       General exam: awake, alert, no acute distress HEENT: moist mucus membranes, hearing grossly normal  Respiratory system: CTAB, no wheezes, rales or rhonchi, normal respiratory effort. Cardiovascular system: normal S1/S2, RRR   Gastrointestinal system: distended, non-tender, +bs. Central nervous system: A&O x3. no gross focal neurologic deficits, normal speech Extremities: moves all, no edema, normal tone Skin: dry, intact, normal temperature Psychiatry: normal mood, congruent affect, judgement and insight appear normal   Data Reviewed:  Notable labs--   Family Communication: none present. Pt updated in detail.  Disposition: Status is: Inpatient Remains inpatient appropriate because: on IV antibiotics, pending paracentesis   Planned Discharge Destination: Home    Time spent: 45 minutes  Author: Montey Apa, DO 04/19/2024 6:00 PM  For on call review [email or web address].

## 2024-04-19 NOTE — ED Notes (Signed)
 Patient ambulated to the bathroom. Had medium BM and medium void. All clothing changed. Fall risk bundle implemented.

## 2024-04-19 NOTE — Assessment & Plan Note (Signed)
-   This involves the right upper and lower quadrant. - She will be admitted to a medical-surgical bed. - Pain management to be provided. - IR consult to be obtained for therapeutic paracentesis.

## 2024-04-19 NOTE — ED Notes (Signed)
 Pt up to use restroom, was able to void. Returned to bed without issue, no assistance required. Pt connected to monitor, covered with blanket.

## 2024-04-20 DIAGNOSIS — A0811 Acute gastroenteropathy due to Norwalk agent: Secondary | ICD-10-CM

## 2024-04-20 LAB — URINE CULTURE

## 2024-04-20 LAB — COMPREHENSIVE METABOLIC PANEL WITH GFR
ALT: 21 U/L (ref 0–44)
AST: 51 U/L — ABNORMAL HIGH (ref 15–41)
Albumin: 2.2 g/dL — ABNORMAL LOW (ref 3.5–5.0)
Alkaline Phosphatase: 318 U/L — ABNORMAL HIGH (ref 38–126)
Anion gap: 9 (ref 5–15)
BUN: 16 mg/dL (ref 6–20)
CO2: 23 mmol/L (ref 22–32)
Calcium: 8.7 mg/dL — ABNORMAL LOW (ref 8.9–10.3)
Chloride: 97 mmol/L — ABNORMAL LOW (ref 98–111)
Creatinine, Ser: 0.83 mg/dL (ref 0.44–1.00)
GFR, Estimated: >60 mL/min (ref >60)
Glucose, Bld: 225 mg/dL — ABNORMAL HIGH (ref 70–99)
Potassium: 3.5 mmol/L (ref 3.5–5.1)
Sodium: 129 mmol/L — ABNORMAL LOW (ref 135–145)
Total Bilirubin: 4.2 mg/dL — ABNORMAL HIGH (ref 0.0–1.2)
Total Protein: 7.3 g/dL (ref 6.5–8.1)

## 2024-04-20 LAB — GLUCOSE, CAPILLARY
Glucose-Capillary: 319 mg/dL — ABNORMAL HIGH (ref 70–99)
Glucose-Capillary: 245 mg/dL — ABNORMAL HIGH (ref 70–99)
Glucose-Capillary: 262 mg/dL — ABNORMAL HIGH (ref 70–99)

## 2024-04-20 LAB — CBC
HCT: 31.4 % — ABNORMAL LOW (ref 36.0–46.0)
Hemoglobin: 9.3 g/dL — ABNORMAL LOW (ref 12.0–15.0)
MCH: 22.2 pg — ABNORMAL LOW (ref 26.0–34.0)
MCHC: 29.6 g/dL — ABNORMAL LOW (ref 30.0–36.0)
MCV: 75.1 fL — ABNORMAL LOW (ref 80.0–100.0)
Platelets: 190 10*3/uL (ref 150–400)
RBC: 4.18 MIL/uL (ref 3.87–5.11)
RDW: 22.4 % — ABNORMAL HIGH (ref 11.5–15.5)
WBC: 3.1 10*3/uL — ABNORMAL LOW (ref 4.0–10.5)
nRBC: 0 % (ref 0.0–0.2)

## 2024-04-20 LAB — MAGNESIUM: Magnesium: 1.9 mg/dL (ref 1.7–2.4)

## 2024-04-20 MED ORDER — CARVEDILOL 6.25 MG PO TABS: 6.2500 mg | ORAL_TABLET | Freq: Two times a day (BID) | ORAL | Status: DC

## 2024-04-20 MED ORDER — BISMUTH SUBSALICYLATE 262 MG/15ML PO SUSP: 30.0000 mL | ORAL | Status: DC | PRN

## 2024-04-20 NOTE — Plan of Care (Signed)
  Problem: Education: Goal: Ability to describe self-care measures that may prevent or decrease complications (Diabetes Survival Skills Education) will improve Outcome: Progressing   Problem: Coping: Goal: Ability to adjust to condition or change in health will improve Outcome: Progressing   Problem: Fluid Volume: Goal: Ability to maintain a balanced intake and output will improve Outcome: Progressing   Problem: Health Behavior/Discharge Planning: Goal: Ability to identify and utilize available resources and services will improve Outcome: Progressing Goal: Ability to manage health-related needs will improve Outcome: Progressing   Problem: Metabolic: Goal: Ability to maintain appropriate glucose levels will improve Outcome: Progressing   Problem: Nutritional: Goal: Maintenance of adequate nutrition will improve Outcome: Progressing Goal: Progress toward achieving an optimal weight will improve Outcome: Progressing   Problem: Skin Integrity: Goal: Risk for impaired skin integrity will decrease Outcome: Progressing   Problem: Tissue Perfusion: Goal: Adequacy of tissue perfusion will improve Outcome: Progressing   Problem: Education: Goal: Knowledge of General Education information will improve Description: Including pain rating scale, medication(s)/side effects and non-pharmacologic comfort measures Outcome: Progressing   Problem: Health Behavior/Discharge Planning: Goal: Ability to manage health-related needs will improve Outcome: Progressing   Problem: Clinical Measurements: Goal: Ability to maintain clinical measurements within normal limits will improve Outcome: Progressing Goal: Will remain free from infection Outcome: Progressing Goal: Respiratory complications will improve Outcome: Progressing Goal: Cardiovascular complication will be avoided Outcome: Progressing   Problem: Activity: Goal: Risk for activity intolerance will decrease Outcome: Progressing    Problem: Nutrition: Goal: Adequate nutrition will be maintained Outcome: Progressing   Problem: Coping: Goal: Level of anxiety will decrease Outcome: Progressing   Problem: Elimination: Goal: Will not experience complications related to bowel motility Outcome: Progressing Goal: Will not experience complications related to urinary retention Outcome: Progressing   Problem: Pain Managment: Goal: General experience of comfort will improve and/or be controlled Outcome: Progressing   Problem: Safety: Goal: Ability to remain free from injury will improve Outcome: Progressing   Problem: Skin Integrity: Goal: Risk for impaired skin integrity will decrease Outcome: Progressing

## 2024-04-20 NOTE — Hospital Course (Signed)
 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  She was found to have Norovirus.

## 2024-04-20 NOTE — Plan of Care (Signed)
  Problem: Education: Goal: Ability to describe self-care measures that may prevent or decrease complications (Diabetes Survival Skills Education) will improve Outcome: Progressing Goal: Individualized Educational Video(s) Outcome: Progressing   Problem: Coping: Goal: Ability to adjust to condition or change in health will improve Outcome: Progressing   Problem: Health Behavior/Discharge Planning: Goal: Ability to identify and utilize available resources and services will improve Outcome: Progressing Goal: Ability to manage health-related needs will improve Outcome: Progressing   Problem: Metabolic: Goal: Ability to maintain appropriate glucose levels will improve Outcome: Progressing   Problem: Nutritional: Goal: Maintenance of adequate nutrition will improve Outcome: Progressing Goal: Progress toward achieving an optimal weight will improve Outcome: Progressing

## 2024-04-20 NOTE — Inpatient Diabetes Management (Signed)
 Inpatient Diabetes Program Recommendations  AACE/ADA: New Consensus Statement on Inpatient Glycemic Control   Target Ranges:  Prepandial:   less than 140 mg/dL      Peak postprandial:   less than 180 mg/dL (1-2 hours)      Critically ill patients:  140 - 180 mg/dL    Latest Reference Range & Units 04/19/24 03:56 04/19/24 07:33 04/19/24 11:39 04/19/24 17:10 04/19/24 20:44  Glucose-Capillary 70 - 99 mg/dL 161 (H) 096 (H) 045 (H) 168 (H) 260 (H)   Review of Glycemic Control  Diabetes history: DM2 Outpatient Diabetes medications: Lantus  60 units daily, Humalog 8 units TID with meals, Metformin  1000 mg BID, Invokana 300 mg daily, FreeStyle Libre 3 Current orders for Inpatient glycemic control: Semglee  30 units daily, Novolog  0-15 units TID with meals, Novolog  0-5 units at bedtime, Jardiance 10 mg daily  Inpatient Diabetes Program Recommendations:    Insulin : Please consider increasing Semglee  to 35 units daily and ordering Novolog  3 units TID with meals for meal coverage if patient eats at least 50% of meals.  Thanks, Beacher Limerick, RN, MSN, CDCES Diabetes Coordinator Inpatient Diabetes Program [phone] (Team Pager from 8am to 5pm)

## 2024-04-20 NOTE — Procedures (Signed)
 PROCEDURE SUMMARY:  Successful US  guided paracentesis from left lateral abdomen.  Yielded 4 liters of hazy yellow fluid.  No immediate complications.  Patient tolerated well.  EBL = trace   Yobana Culliton Alonzo Artis PA-C 04/20/2024 4:32 PM

## 2024-04-20 NOTE — Progress Notes (Signed)
 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  She was found to have Norovirus.    Assessment and Plan:  Norovirus infection Patient presented with abdominal pain She has h/o cirrhosis and concern was for SBP Given n/v/d, she was checked for C diff (negative) but GI pathogen panel showed that she has Norovirus infection Symptomatic treatment with Pepto  Recurrent Ascites  Most recent paracentesis 03/22/24 here 3.6 L removed. Diagnostic paracentesis done by EDP Stop ceftriaxone , as this does not appear to be c/w SBP Pain control PRN Likely to meet criteria for TIPS soon (2 recent para's on 4/17, 4/22)   Hepatic cirrhosis with ascites, unspecified type Continue Lasix , rifaximin , spironolactone  Monitor mental status for signs of hepatic encephalopathy Lactulose  prn   Dyslipidemia Continue rosuvastatin     Orthostatic hypotension Hold carvedilol Continue midodrine    Type 2 diabetes mellitus with peripheral neuropathy A1c 6.4, good control Hold Glucophage  Continue Lantus  Cover with moderate-scale SSI Carb modified diet  Continue Invokana (Jardiance per formulary) Continue gabapentin    Anxiety and depression Continue Wellbutrin  XL, Celexa , Remeron          Consultants: None   Procedures: Paracentesis 5/19   Antibiotics: Ceftriaxone  5/20-  30 Day Unplanned Readmission Risk Score    Flowsheet Row ED to Hosp-Admission (Current) from 04/18/2024 in [facility]  30 Day Unplanned Readmission Risk Score (%) 31.75 Filed at 04/20/2024 1200       This score is the patient's risk of an unplanned readmission within 30 days of being discharged (0 -100%). The score is based on dignosis, age, lab  data, medications, orders, and past utilization.   Low:  0-14.9   Medium: 15-21.9   High: 22-29.9   Extreme: 30 and above           Subjective: Had a rough night with persistent diarrhea.  Last stool was at 0600 and still loose.   Objective: Vitals:   04/19/24 2002 04/20/24 0408  BP: (!) 117/51 122/64  Pulse: 71 81  Resp: 16 16  Temp: 97.8 F (36.6 C) 97.9 F (36.6 C)  SpO2: 99% 95%    Intake/Output Summary (Last 24 hours) at 04/20/2024 1603 Last data filed at 04/20/2024 0900 Gross per 24 hour  Intake 360 ml  Output --  Net 360 ml   Filed Weights   04/18/24 1727  Weight: 77.1 kg    Exam:  General:  Appears calm and comfortable and is in NAD, up in bedside chair Eyes:  normal lids, iris ENT:  grossly normal hearing, lips & tongue, mmm Cardiovascular:  RRR. No LE edema.  Respiratory:   CTA bilaterally with no wheezes/rales/rhonchi.  Normal respiratory effort. Abdomen:  soft, diffusely TTP, mildly distended Skin:  no rash or induration seen on limited exam Musculoskeletal:  grossly normal tone BUE/BLE, good ROM, no bony abnormality Psychiatric:  grossly normal mood and affect, speech fluent and appropriate, AOx3 Neurologic:  CN 2-12 grossly intact, moves all extremities in coordinated fashion, sensation intact  Data Reviewed: I have reviewed the patient's lab results since admission.  Pertinent labs for today include:   Na++ 129 Glucose 225 AP 318 Albumi 2.2 AST 51/ALT 21/Bili 4.2 WBC 3.1 Hgb 9.3 C diff negative GI pathogen panel +  Norovirus Paracentesis fluid unremarkable A1c 6.4     Family Communication: None present  Disposition: Status is: Inpatient Remains inpatient appropriate because: ongoing management     Time spent: 50 minutes  Unresulted Labs (From admission, onward)     Start     Ordered   04/21/24 0500  CBC with Differential/Platelet  Tomorrow morning,   R       Question:  Specimen collection method  Answer:  Lab=Lab collect    04/20/24 1603   04/21/24 0500  Comprehensive metabolic panel with GFR  Tomorrow morning,   R       Question:  Specimen collection method  Answer:  Lab=Lab collect   04/20/24 1603             Author: Lorita Rosa, MD 04/20/2024 4:03 PM  For on call review [email or web address].

## 2024-04-21 DIAGNOSIS — A0811 Acute gastroenteropathy due to Norwalk agent: Secondary | ICD-10-CM | POA: Diagnosis not present

## 2024-04-21 LAB — CBC WITH DIFFERENTIAL/PLATELET
Abs Immature Granulocytes: 0.01 10*3/uL (ref 0.00–0.07)
Basophils Absolute: 0 10*3/uL (ref 0.0–0.1)
Basophils Relative: 0 %
Eosinophils Absolute: 0.2 10*3/uL (ref 0.0–0.5)
Eosinophils Relative: 5 %
HCT: 28.1 % — ABNORMAL LOW (ref 36.0–46.0)
Hemoglobin: 8.5 g/dL — ABNORMAL LOW (ref 12.0–15.0)
Immature Granulocytes: 0 %
Lymphocytes Relative: 28 %
Lymphs Abs: 0.8 10*3/uL (ref 0.7–4.0)
MCH: 22.7 pg — ABNORMAL LOW (ref 26.0–34.0)
MCHC: 30.2 g/dL (ref 30.0–36.0)
MCV: 74.9 fL — ABNORMAL LOW (ref 80.0–100.0)
Monocytes Absolute: 0.4 10*3/uL (ref 0.1–1.0)
Monocytes Relative: 15 %
Neutro Abs: 1.5 10*3/uL — ABNORMAL LOW (ref 1.7–7.7)
Neutrophils Relative %: 52 %
Platelets: 165 10*3/uL (ref 150–400)
RBC: 3.75 MIL/uL — ABNORMAL LOW (ref 3.87–5.11)
RDW: 22 % — ABNORMAL HIGH (ref 11.5–15.5)
Smear Review: NORMAL
WBC: 2.9 10*3/uL — ABNORMAL LOW (ref 4.0–10.5)
nRBC: 0 % (ref 0.0–0.2)

## 2024-04-21 LAB — COMPREHENSIVE METABOLIC PANEL WITH GFR
ALT: 19 U/L (ref 0–44)
AST: 43 U/L — ABNORMAL HIGH (ref 15–41)
Albumin: 2.1 g/dL — ABNORMAL LOW (ref 3.5–5.0)
Alkaline Phosphatase: 285 U/L — ABNORMAL HIGH (ref 38–126)
Anion gap: 7 (ref 5–15)
BUN: 16 mg/dL (ref 6–20)
CO2: 24 mmol/L (ref 22–32)
Calcium: 8.7 mg/dL — ABNORMAL LOW (ref 8.9–10.3)
Chloride: 102 mmol/L (ref 98–111)
Creatinine, Ser: 0.7 mg/dL (ref 0.44–1.00)
GFR, Estimated: >60 mL/min (ref >60)
Glucose, Bld: 240 mg/dL — ABNORMAL HIGH (ref 70–99)
Potassium: 3.7 mmol/L (ref 3.5–5.1)
Sodium: 133 mmol/L — ABNORMAL LOW (ref 135–145)
Total Bilirubin: 3.2 mg/dL — ABNORMAL HIGH (ref 0.0–1.2)
Total Protein: 6.5 g/dL (ref 6.5–8.1)

## 2024-04-21 LAB — GLUCOSE, CAPILLARY
Glucose-Capillary: 183 mg/dL — ABNORMAL HIGH (ref 70–99)
Glucose-Capillary: 207 mg/dL — ABNORMAL HIGH (ref 70–99)
Glucose-Capillary: 245 mg/dL — ABNORMAL HIGH (ref 70–99)
Glucose-Capillary: 234 mg/dL — ABNORMAL HIGH (ref 70–99)

## 2024-04-21 MED ORDER — INSULIN GLARGINE-YFGN 100 UNIT/ML ~~LOC~~ SOLN
33.0000 [IU] | Freq: Every day | SUBCUTANEOUS | Status: DC
  Administered 2024-04-22: 33 [IU] via SUBCUTANEOUS
  Filled 2024-04-21: qty 0.33

## 2024-04-21 MED ORDER — INSULIN ASPART 100 UNIT/ML IJ SOLN
4.0000 [IU] | Freq: Three times a day (TID) | INTRAMUSCULAR | Status: DC
  Administered 2024-04-21 – 2024-04-22 (×3): 4 [IU] via SUBCUTANEOUS
  Filled 2024-04-21 (×3): qty 1

## 2024-04-21 NOTE — Plan of Care (Signed)
  Problem: Education: Goal: Ability to describe self-care measures that may prevent or decrease complications (Diabetes Survival Skills Education) will improve Outcome: Progressing   Problem: Coping: Goal: Ability to adjust to condition or change in health will improve Outcome: Progressing   Problem: Nutritional: Goal: Maintenance of adequate nutrition will improve Outcome: Progressing   Problem: Education: Goal: Knowledge of General Education information will improve Description: Including pain rating scale, medication(s)/side effects and non-pharmacologic comfort measures Outcome: Progressing   Problem: Activity: Goal: Risk for activity intolerance will decrease Outcome: Progressing   Problem: Coping: Goal: Level of anxiety will decrease Outcome: Progressing   Problem: Elimination: Goal: Will not experience complications related to bowel motility Outcome: Progressing Goal: Will not experience complications related to urinary retention Outcome: Progressing   Problem: Pain Managment: Goal: General experience of comfort will improve and/or be controlled Outcome: Progressing   Problem: Safety: Goal: Ability to remain free from injury will improve Outcome: Progressing   Problem: Skin Integrity: Goal: Risk for impaired skin integrity will decrease Outcome: Progressing

## 2024-04-21 NOTE — Progress Notes (Signed)
  BRIEF IR NOTE:  IR was informed by patient's RN that patient has persistently been leaking from yesterday's paracentesis site, now with notable discomfort and pain. The patient is concerned she may have an infection of access site s/p paracentesis.   On examination, patient complains of diffuse and generalized left abdomen tenderness to palpation, with distention noted. Paracentesis access site without erythema, swelling, purulence, induration, crepitus, or other sign of infection. Clear peritoneal fluid slowly dripping at site. VVS. No fever.  Dermabond applied to paracentesis skin site, and confirmed patient is no longer leaking. RN staff advised of this, and they will proceed with linen changes. Advise patient that there are no signs of infection at paracentesis access site. She does exhibit signs of underlying colon distension, as expected from her enteric norovirus infection, and her symptoms are most compatible with this. Patient reassured.  IR will continue to follow. Please reach out with any further concerns.   Electronically Signed: Lovena Rubinstein, PA-C 04/21/2024, 3:48 PM    \

## 2024-04-21 NOTE — Progress Notes (Signed)
 Into follow up with Dermabond placement. Area continue to be dry pt denies pain or any leakage of area since dermabond placement.

## 2024-04-21 NOTE — TOC Initial Note (Signed)
 Transition of Care [facility]) - Initial/Assessment Note    Patient Details  Name: Abigail Molina MRN: [id number] Date of Birth: 27-Nov-1966  Transition of Care [facility]) CM/SW Contact:    Abigail Bennett, LCSW Phone Number: 04/21/2024, 3:04 PM  Clinical Narrative:  Readmission prevention screen complete. CSW met with patient. No family at bedside. CSW introduced role and explained that discharge planning would be discussed. PCP is Dr. Danetta Molina at [facility]. Patient drives herself to appointments, cousin or uncle drive her, or she uses Medicaid transportation. Pharmacy is [facility] [address]. No issues obtaining medications. Patient lives home with her cousin, Abigail Molina. She is active with [facility] for PT, OT, RN. She has a RW, BSC, and shower chair at home but does not use it because it's too small. No further concerns. CSW will continue to follow patient for support and facilitate return home once stable.                Expected Discharge Plan: Home w Home Health Services Barriers to Discharge: Continued Medical Work up   Patient Goals and CMS Choice     Choice offered to / list presented to : Patient      Expected Discharge Plan and Services     Post Acute Care Choice: Resumption of Svcs/PTA Provider Living arrangements for the past 2 months: Mobile Home                           HH Arranged: RN, PT, OT [facility] Agency: [facility] Date [facility] Agency Contacted: 04/21/24   Representative spoke with at [facility] Agency: Abigail Molina  Prior Living Arrangements/Services Living arrangements for the past 2 months: Mobile Home Lives with:: Relatives Patient language and need for interpreter reviewed:: Yes Do you feel safe going back to the place where you live?: Yes      Need for Family Participation in Patient Care: Yes (Comment) Care giver support system in place?: Yes (comment) Current home services: DME, Home OT, Home PT, Home RN Criminal Activity/Legal Involvement  Pertinent to Current Situation/Hospitalization: No - Comment as needed  Activities of Daily Living   ADL Screening (condition at time of admission) Independently performs ADLs?: Yes (appropriate for developmental age) Is the patient deaf or have difficulty hearing?: No Does the patient have difficulty seeing, even when wearing glasses/contacts?: No Does the patient have difficulty concentrating, remembering, or making decisions?: No  Permission Sought/Granted Permission sought to share information with : Facility Industrial/product designer granted to share information with : Yes, Verbal Permission Granted     Permission granted to share info w AGENCY: [facility]        Emotional Assessment Appearance:: Appears stated age Attitude/Demeanor/Rapport: Engaged, Gracious Affect (typically observed): Accepting, Appropriate, Calm, Pleasant Orientation: : Oriented to Self, Oriented to Place, Oriented to  Time, Oriented to Situation Alcohol  / Substance Use: Not Applicable Psych Involvement: No (comment)  Admission diagnosis:  Generalized abdominal pain [R10.84] Other ascites [R18.8] Abdominal pain [R10.9] Patient Active Problem List   Diagnosis Date Noted   Anxiety and depression 04/19/2024   Type 2 diabetes mellitus with peripheral neuropathy (HCC) 04/19/2024   Essential hypertension 04/19/2024   Alcoholic cirrhosis of liver with ascites (HCC) 04/19/2024   Dyslipidemia 04/19/2024   Acute gastroenteropathy due to Norovirus 04/18/2024   Esophageal varices with bleeding in diseases classified elsewhere (HCC) 03/23/2024   Ascites of liver 03/23/2024   Melena 03/22/2024   Acute  on chronic anemia 03/21/2024   Obesity (BMI 30-39.9) 03/21/2024   Acute metabolic encephalopathy 02/03/2024   Pressure injury of skin 02/03/2024   NSTEMI (non-ST elevated myocardial infarction) (HCC) 02/03/2024   Septicemia (HCC) 02/03/2024   Acute respiratory failure with hypoxemia (HCC)  02/03/2024   Hyponatremia 02/03/2024   Septic shock (HCC) 01/26/2024   Dizziness 12/16/2023   Stage 3a chronic kidney disease (HCC) 12/16/2023   AKI (acute kidney injury) (HCC) 11/21/2023   Hyperkalemia 11/21/2023   Pre-syncope 11/21/2023   Abnormal drug screen 11/09/2023   Pain in joint of right shoulder 11/05/2023   Psoriasis 11/02/2023   Pharmacologic therapy 11/02/2023   Disorder of skeletal system 11/02/2023   Problems influencing health status 11/02/2023   Chronic knee pain (5th area of Pain) (Bilateral) 11/02/2023   Osteoarthritis of knees (Bilateral) 11/02/2023   Chronic shoulder pain (3ry area of Pain) (Right) 11/02/2023   Chronic intractable headache (2ry area of Pain) 11/02/2023   Chronic neck pain (2ry area of Pain) 11/02/2023   Chronic low back pain (4th area of Pain) (Bilateral) w/o sciatica 11/02/2023   Left upper quadrant pain 08/17/2023   OSA (obstructive sleep apnea) 05/08/2023   Dysphagia 02/02/2023   Generalized pruritus 02/02/2023   Lichen planus of tongue 01/03/2023   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 type 2 diabetes mellitus (HCC) 12/02/2022   Myopia of both eyes with astigmatism and presbyopia 12/02/2022   Current moderate episode of major depressive disorder (HCC) 11/06/2022   Chronic pain syndrome 11/06/2022   Other insomnia 11/06/2022   Portal hypertension (HCC) 10/08/2022   Nausea 09/22/2022   Other ascites 09/22/2022   Right upper quadrant abdominal pain 09/22/2022   Calcified cerebral meningioma (HCC) 08/16/2022   Hx of psoriasis 08/06/2022   Encephalopathy, portal systemic (HCC) 06/17/2022   Esophageal varices in cirrhosis (HCC) 06/17/2022   Secondary esophageal varices without bleeding (HCC) 06/17/2022   Cirrhosis (HCC) 06/17/2022   NPDR (nonproliferative diabetic retinopathy) (HCC) 06/17/2021   Ptosis of eyelid, left 06/17/2021   Diabetes mellitus (HCC) 06/17/2021   Hepatic steatosis  03/13/2021   Coronary artery disease involving native heart without angina pectoris 02/05/2021   H/O diabetic gastroparesis 01/09/2021   Retinopathy of left eye, background, proliferative 01/09/2021   Angular blepharoconjunctivitis of both eyes 12/21/2020   Cataract associated with type 2 diabetes mellitus (HCC) 12/21/2020   Dry eye syndrome, bilateral 12/21/2020   Chronic abdominal pain (1ry area of Pain) 03/04/2018   Diabetes (HCC) 12/12/2015   Hyperlipidemia 12/12/2015   Type 2 diabetes mellitus, with long-term current use of insulin  (HCC) 12/12/2015   Hypertension 05/23/2015   Acid reflux 05/23/2015   PFD (pelvic floor dysfunction) 04/07/2013   Hemorrhage of rectum and anus 03/17/2011   PCP:  Care, [place] Primary Pharmacy:   [facility] [address] ([address]), [place] - [address] [place] ([place]) [place] [address] Phone: [phone] Fax: [phone]  [facility] [address] [place] [address] Phone: [phone] Fax: [phone]     Social Drivers of Health (SDOH) Social History: SDOH Screenings   Food Insecurity: Food Insecurity Present (04/19/2024)  Housing: Low Risk  (04/19/2024)  Transportation Needs: Unmet Transportation Needs (04/19/2024)  Utilities: Not At Risk (04/19/2024)  Depression (PHQ2-9): High Risk (11/02/2023)  Financial Resource Strain: Low Risk  (12/16/2023)   Received from [facility]  Physical Activity: Insufficiently Active (01/09/2021)   [facility] from [facility]  Social Connections: Socially Isolated (04/19/2024)  Stress: Stress Concern Present (09/17/2022)   [facility] from [facility]  Tobacco Use: Low Risk  (04/18/2024)  Health Literacy: Low Risk  (01/09/2021)   Received from [facility]   SDOH Interventions:     Readmission Risk Interventions    04/21/2024    2:57 PM 04/21/2024   11:45 AM  03/18/2024    3:36 PM  Readmission Risk Prevention Plan  Transportation Screening Complete Complete Complete  Medication Review (RN Care Manager) Complete Complete Complete  PCP or Specialist appointment within 3-5 days of discharge Complete Complete   HRI or Home Care Consult Complete Complete Complete  SW Recovery Care/Counseling Consult Complete Complete   Palliative Care Screening Not Applicable Not Applicable   Skilled Nursing Facility Not Applicable Not Applicable Not Applicable

## 2024-04-21 NOTE — Plan of Care (Signed)
  Problem: Coping: Goal: Ability to adjust to condition or change in health will improve Outcome: Progressing   Problem: Nutritional: Goal: Maintenance of adequate nutrition will improve Outcome: Progressing   Problem: Skin Integrity: Goal: Risk for impaired skin integrity will decrease Outcome: Progressing   Problem: Tissue Perfusion: Goal: Adequacy of tissue perfusion will improve Outcome: Progressing   Problem: Activity: Goal: Risk for activity intolerance will decrease Outcome: Progressing   Problem: Coping: Goal: Level of anxiety will decrease Outcome: Progressing   Problem: Elimination: Goal: Will not experience complications related to bowel motility Outcome: Progressing Goal: Will not experience complications related to urinary retention Outcome: Progressing   Problem: Pain Managment: Goal: General experience of comfort will improve and/or be controlled Outcome: Progressing   Problem: Safety: Goal: Ability to remain free from injury will improve Outcome: Progressing

## 2024-04-21 NOTE — Progress Notes (Signed)
 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  She was found to have Norovirus.    Assessment and Plan:  Norovirus infection Patient presented with abdominal pain She has h/o cirrhosis and concern was for SBP Given n/v/d, she was checked for C diff (negative) but GI pathogen panel showed that she has Norovirus infection Symptomatic treatment with Pepto Improving from this standpoint   Recurrent Ascites  Most recent paracentesis 03/22/24 with 3.6 L removed. Diagnostic paracentesis done by EDP Stop ceftriaxone , as this does not appear to be c/w SBP Pain control PRN Therapeutic paracentesis on 5/21 with 4L drained off Likely to meet criteria for TIPS soon (2 recent para's on 4/17, 4/22 and again on 5/21); she has been referred by IR Site is leaking clear fluid without frank infection but IR was asked to come by and evaluate as well   Hepatic cirrhosis with ascites, unspecified type Continue Lasix , rifaximin , spironolactone  Monitor mental status for signs of hepatic encephalopathy Lactulose  prn   Dyslipidemia Continue rosuvastatin     Orthostatic hypotension Hold carvedilol Continue midodrine    Type 2 diabetes mellitus with peripheral neuropathy A1c 6.4, good control Hold Glucophage  Continue Lantus  Cover with moderate-scale SSI Carb modified diet  Continue Invokana (Jardiance per formulary) Continue gabapentin    Anxiety and depression Continue Wellbutrin  XL, Celexa , Remeron          Consultants: IR   Procedures: Diagnostic Paracentesis 5/19 Therapeutic Paracentesis 5/21   Antibiotics: Ceftriaxone  5/20-21       30 Day Unplanned Readmission Risk Score    Flowsheet Row ED to Hosp-Admission (Current) from 04/18/2024  in [facility]  30 Day Unplanned Readmission Risk Score (%) 31.53 Filed at 04/21/2024 0801       This score is the patient's risk of an unplanned readmission within 30 days of being discharged (0 -100%). The score is based on dignosis, age, lab data, medications, orders, and past utilization.   Low:  0-14.9   Medium: 15-21.9   High: 22-29.9   Extreme: 30 and above           Subjective: Abdominal pain and ascites fluid drainage s/p paracentesis but less distention and she is breathing better.  Diarrhea is improving.   Objective: Vitals:   04/21/24 0815 04/21/24 0816  BP: 116/61   Pulse: 78 76  Resp: 16   Temp:  98.2 F (36.8 C)  SpO2: 100% 100%    Intake/Output Summary (Last 24 hours) at 04/21/2024 1200 Last data filed at 04/20/2024 1218 Gross per 24 hour  Intake 100.49 ml  Output --  Net 100.49 ml   Filed Weights   04/18/24 1727  Weight: 77.1 kg    Exam:  General:  Appears calm and comfortable and is in NAD Eyes:  normal lids, iris ENT:  grossly normal hearing, lips & tongue, mmm Cardiovascular:  RRR. No LE edema.  Respiratory:   CTA bilaterally with no wheezes/rales/rhonchi.  Normal respiratory effort. Abdomen:  soft, less TTP, improved distention, clear/yellow ascites drainage on bandage Skin:  no rash or induration seen on limited exam Musculoskeletal:  grossly normal tone BUE/BLE, good ROM, no bony abnormality Psychiatric:  grossly normal mood and affect, speech fluent and appropriate, AOx3 Neurologic:  CN 2-12  grossly intact, moves all extremities in coordinated fashion, sensation intact  Data Reviewed: I have reviewed the patient's lab results since admission.  Pertinent labs for today include:   Na++ 133, improved, not clinically significant Glucose 240 AP 285, improved Albumin  2.1 AST 43/Bili 3.2, improved WBC 2.9 Hgb 8.5, stable     Family Communication: None present  Disposition: Status is:  Inpatient Remains inpatient appropriate because: ongoing management; anticipate dc on 5/23     Time spent: 50 minutes  Unresulted Labs (From admission, onward)     Start     Ordered   04/22/24 0500  CBC with Differential/Platelet  Tomorrow morning,   R       Question:  Specimen collection method  Answer:  Lab=Lab collect   04/21/24 1200   04/22/24 0500  Comprehensive metabolic panel with GFR  Tomorrow morning,   R       Question:  Specimen collection method  Answer:  Lab=Lab collect   04/21/24 1200             Author: Lorita Rosa, MD 04/21/2024 12:00 PM  For on call review [email or web address].

## 2024-04-21 NOTE — Progress Notes (Signed)
 pt expressing concerns of possible infection at the site of her paracentesis LLQ. Gauze under ABD saturated with clear yellow fluid. No apparent signs of infection no odor the fluid is clear yellow same as what is within Radiology report they collected .site is without redness or warmth. Pt express she is having chills again and having more stomach discomfort than usual. Education on norovirus and will have to take its course not aware of any special medications or treatment if the chills and stomach discomfort were related to noro. Pt continue to express concerns of having new development of infection. MD Murrel Arnt notified with plans to eval.

## 2024-04-21 NOTE — Inpatient Diabetes Management (Signed)
 Inpatient Diabetes Program Recommendations  AACE/ADA: New Consensus Statement on Inpatient Glycemic Control  Target Ranges:  Prepandial:   less than 140 mg/dL      Peak postprandial:   less than 180 mg/dL (1-2 hours)      Critically ill patients:  140 - 180 mg/dL    Latest Reference Range & Units 04/20/24 12:15 04/20/24 16:51 04/20/24 22:11 04/21/24 08:16  Glucose-Capillary 70 - 99 mg/dL 253 (H) 664 (H) 403 (H) 183 (H)    Review of Glycemic Control  Diabetes history: DM2 Outpatient Diabetes medications: Lantus  60 units daily, Humalog 8 units TID with meals, Metformin  1000 mg BID, Invokana 300 mg daily, FreeStyle Libre 3 Current orders for Inpatient glycemic control: Semglee  30 units daily, Novolog  0-15 units TID with meals, Novolog  0-5 units at bedtime, Jardiance 10 mg daily   Inpatient Diabetes Program Recommendations:     Insulin : Post prandial glucose up to 319 mg/dl on 4/74 and CBG 259 mg/dl this am.  Please consider increasing Semglee  to 33 units daily and ordering Novolog  4 units TID with meals for meal coverage if patient eats at least 50% of meals.  Thanks, Beacher Limerick, RN, MSN, CDCES Diabetes Coordinator Inpatient Diabetes Program [phone] (Team Pager from 8am to 5pm)

## 2024-04-22 ENCOUNTER — Other Ambulatory Visit: Payer: Self-pay

## 2024-04-22 DIAGNOSIS — A0811 Acute gastroenteropathy due to Norwalk agent: Secondary | ICD-10-CM | POA: Diagnosis not present

## 2024-04-22 LAB — CBC WITH DIFFERENTIAL/PLATELET
Abs Immature Granulocytes: 0.01 10*3/uL (ref 0.00–0.07)
Basophils Absolute: 0 10*3/uL (ref 0.0–0.1)
Basophils Relative: 0 %
Eosinophils Absolute: 0.1 10*3/uL (ref 0.0–0.5)
Eosinophils Relative: 4 %
HCT: 29 % — ABNORMAL LOW (ref 36.0–46.0)
Hemoglobin: 8.6 g/dL — ABNORMAL LOW (ref 12.0–15.0)
Immature Granulocytes: 0 %
Lymphocytes Relative: 29 %
Lymphs Abs: 0.9 10*3/uL (ref 0.7–4.0)
MCH: 22.5 pg — ABNORMAL LOW (ref 26.0–34.0)
MCHC: 29.7 g/dL — ABNORMAL LOW (ref 30.0–36.0)
MCV: 75.7 fL — ABNORMAL LOW (ref 80.0–100.0)
Monocytes Absolute: 0.4 10*3/uL (ref 0.1–1.0)
Monocytes Relative: 13 %
Neutro Abs: 1.7 10*3/uL (ref 1.7–7.7)
Neutrophils Relative %: 54 %
Platelets: 165 10*3/uL (ref 150–400)
RBC: 3.83 MIL/uL — ABNORMAL LOW (ref 3.87–5.11)
RDW: 22 % — ABNORMAL HIGH (ref 11.5–15.5)
Smear Review: NORMAL
WBC: 3.2 10*3/uL — ABNORMAL LOW (ref 4.0–10.5)
nRBC: 0 % (ref 0.0–0.2)

## 2024-04-22 LAB — COMPREHENSIVE METABOLIC PANEL WITH GFR
ALT: 18 U/L (ref 0–44)
AST: 44 U/L — ABNORMAL HIGH (ref 15–41)
Albumin: 2.1 g/dL — ABNORMAL LOW (ref 3.5–5.0)
Alkaline Phosphatase: 334 U/L — ABNORMAL HIGH (ref 38–126)
Anion gap: 7 (ref 5–15)
BUN: 16 mg/dL (ref 6–20)
CO2: 22 mmol/L (ref 22–32)
Calcium: 8 mg/dL — ABNORMAL LOW (ref 8.9–10.3)
Chloride: 95 mmol/L — ABNORMAL LOW (ref 98–111)
Creatinine, Ser: 0.62 mg/dL (ref 0.44–1.00)
GFR, Estimated: >60 mL/min (ref >60)
Glucose, Bld: 253 mg/dL — ABNORMAL HIGH (ref 70–99)
Potassium: 3.9 mmol/L (ref 3.5–5.1)
Sodium: 124 mmol/L — ABNORMAL LOW (ref 135–145)
Total Bilirubin: 2.9 mg/dL — ABNORMAL HIGH (ref 0.0–1.2)
Total Protein: 7 g/dL (ref 6.5–8.1)

## 2024-04-22 LAB — BODY FLUID CULTURE W GRAM STAIN
Culture: NO GROWTH
Gram Stain: NONE SEEN

## 2024-04-22 LAB — GLUCOSE, CAPILLARY
Glucose-Capillary: 191 mg/dL — ABNORMAL HIGH (ref 70–99)
Glucose-Capillary: 271 mg/dL — ABNORMAL HIGH (ref 70–99)

## 2024-04-22 MED ORDER — OXYCODONE HCL 5 MG PO TABS
5.0000 mg | ORAL_TABLET | Freq: Four times a day (QID) | ORAL | 0 refills | Status: DC | PRN
Start: 2024-04-22 — End: 2024-05-04
  Filled 2024-04-22: qty 20, 5d supply, fill #0

## 2024-04-22 MED ORDER — SODIUM CHLORIDE 1 G PO TABS
1.0000 g | ORAL_TABLET | Freq: Three times a day (TID) | ORAL | 0 refills | Status: AC
  Filled 2024-04-22: qty 9, 3d supply, fill #0

## 2024-04-22 MED ORDER — BISMUTH SUBSALICYLATE 525 MG/15ML PO SUSP
15.0000 mL | ORAL | 0 refills | Status: DC | PRN
  Filled 2024-04-22: qty 360, 2d supply, fill #0
  Filled 2024-04-22: qty 237, 3d supply, fill #0

## 2024-04-22 MED ORDER — SODIUM CHLORIDE 1 G PO TABS: 1.0000 g | ORAL_TABLET | Freq: Three times a day (TID) | ORAL | Status: DC

## 2024-04-22 MED ORDER — SODIUM CHLORIDE 0.9 % IV BOLUS
250.0000 mL | Freq: Once | INTRAVENOUS | Status: AC
Start: 2024-04-22 — End: 2024-04-22
  Administered 2024-04-22: 250 mL via INTRAVENOUS

## 2024-04-22 MED FILL — FREESTYLE LIBRE 3 SENSOR DEVICE: ORAL | 28 days supply | Qty: 2 | Fill #2

## 2024-04-22 NOTE — Plan of Care (Signed)

## 2024-04-22 NOTE — Discharge Instructions (Signed)
 Food Resources  Agency Name: [facility] Agency Address: [address], [place], [place] [address] Phone: [phone] Website: [email or web address] Service(s) Offered: Housing services, self-sufficiency, congregate meal program, weatherization program, Event organiser program, emergency food assistance,  housing counseling, home ownership program, wheels - to work program.  [employer] free for 60 and older at various locations from [employer], Monday-Friday:  [employer], [address] [place], [phone] -[facility], [address], [place] [phone]  -[facility], [address] [place] [phone]  -[address] [place], [phone]  Agency Name: [facility] on Wheels Address: [phone] [address], [place], [place] [address] Phone: [phone] Website: [email or web address] Service(s) Offered: Home delivered hot, frozen, and emergency  meals. Grocery assistance program which matches  volunteers one-on-one with seniors unable to grocery shop  for themselves. Must be 60 years and older; less than 20  hours of in-home aide service, limited or no driving ability;  live alone or with someone with a disability; live in  [place].  Agency Name: [facility] [employer]) Address: [address] [place], [place] [address] Phone: [phone] Service(s) Offered: Food is served to shut-ins, homeless, elderly, and low income people in the community every Saturday (11:30 am-12:30 pm) and Sunday (12:30 pm-1:30pm). Volunteers also offer help and encouragement in seeking employment,  and spiritual guidance.  Agency Name: Department of Social Services Address: [address] [place], [place] [address] Phone: [phone] Service(s) Offered: Child support services; child welfare services; food stamps; Medicaid; work first family assistance; and aid  with fuel,  rent, food and medicine.  Agency Name: Dietitian Address: [address], [place], [place] Phone: [phone] Website: [email or web address] Services Offered: Monday 10:00am-12:00, 8:00pm-9:00pm, and Friday 10:00am-12:00.  Agency Name: [employer] [facility] Address: [address], [place], [place] [address] Phone: [phone] Website: [email or web address] Service(s) Offered: Serves weekday meals, open from 11:30 am- 1:00 pm., and 6:30-7:30pm, Monday-Wednesday-Friday distributes food 3:30-6pm, Monday-Wednesday-Friday.  Agency Name: [facility] Address: [address] [place], [place] Phone: [phone] Website: [email or web address] Services Offered: Distributes food the 4th Saturday of the month, starting at 8:00 am  Agency Name: [facility] Address: [address], [place], [place] [address] Phone: [phone] Website: [email or web address] Service(s) Offered: Bread of life, weekly food pantry. Open Wednesdays from 10:00am-noon.  Agency Name: [facility] [employer] Address: [address] [place], [place], [place] Phone: [phone] Services Offered: Distributes food 9am-1pm, Monday-Thursday. Call for details.  Agency Name: [facility] Address: [address] [place], [place] [address] Phone: [phone] Website: [email or web address] Service(s) Offered: Games developer. Call for assistance.  Agency Name: [facility] Address: [address] [place], [place] [address] Phone: [phone] Service Offered: Emergency Food Pantry. Call for appointment.  Agency Name: [facility] Address: [address], [place], [place] [address] Phone: [phone] Website: [email or web address] Services Offered: Games developer. Call for details  Agency Name: [facility] Address: [address] [place], [place] Phone:  [phone] Website: [email or web address] Service(s) Offered: Emergency Food Pantry. Call for details.  Agency Name: Holiday representative Address: [address] [place], [place] [address] Phone: [phone] or [phone] Website: [email or web address] Service(s) Offered: Distribute food 9am-11:30 am, Tuesday-Friday, and 1-3:30pm, Monday-Friday. Food pantry Monday-Friday 1pm-3pm, fresh items, Mon.-Wed.-Fri.  Agency Name: [facility] ([facility] Address: [address] [place], [place] [address] Phone: [phone] Website: [email or web address] Services Offered: Distribute food Tues and Sats from 9:00am-noon.  Closed 1st Saturday of each month. Call for details  Agency Name: [employer] Address: [facility] [address], [place] [address] Phone: [phone]  Services Offered: Delivers meals every Thursday     Transportation Resources for [employer] Area  Agency Name: [facility] Agency Address: [address] [place], [place] [address] Phone: [phone] Email: [email or web address] Website: [email or web address] Service(s) Offered: Housing services, self-sufficiency, congregate meal program, weatherization program, Field seismologist program, emergency food assistance,  housing counseling, home ownership program, wheels-towork program.  Agency Name: [facility] [employer] [phone]) Address: [address] [place], [place] [address] Phone: [phone] Website: [email or web address] Service(s) Offered: Transportation for [employer], subscription and demand response; Dial-a-Ride for citizens 30 years of age or older.  Agency Name: Department of Social Services Address: [address] [place], [place] [address] Phone: [phone] Service(s) Offered: Child support services; child welfare services; food stamps; Medicaid; work first family assistance;  and aid with fuel,  rent, food and medicine, transportation assistance.  Agency Name: Disabled [employer] (DAV) Transportation  Network Phone: [phone] Service(s) Offered: Transports veterans to the [facility] medical center. Call  forty-eight hours in advance and leave the name, telephone  number, date, and time of appointment. Veteran will be  contacted by the driver the day before the appointment to  arrange a pick up point    [employer] ACTA currently provides door to door services. ACTA connects with PART daily for services to [facility]. ACTA also performs contract services to [employer] operates 27 vehicles, all but 3 mini-vans are equipped with lifts for special needs as well as the general public. ACTA drivers are each CDL certified and trained in First Aid and CPR. ACTA was established in 2002 by [employer]. An independent Industrial/product designer. ACTA operates via Cytogeneticist with required local 10% match funding from [employer]. ACTA provides over 80,000 passenger trips each year, including [facility] and [employer] sites.  Call at least by 11 AM one business day prior to needing transportation  [employer].                      [place], [place] [address]     Office Hours: Monday-Friday  8 AM - 5 PM  Do you feel isolated?  The [facility] offers a [employer] that anyone can call toll free at [phone]. The friendship line is available 24 hours a day  [employer] is a Program of All-inclusive Care for the Elderly (PACE). Their mission is to promote and sustain the independence of seniors wishing to remain in the community. They provide seniors with comprehensive long-term health, social, medical and dietary care. Their program is a safe alternative to nursing home care.  [phone]  [facility] Physical Address [facility] [address] [place], [place] [address] Phone: [phone]. . Online zoom yoga class, connect with others without leaving your home [facility] offers [place] dance cardio sessions for individuals via Zoom. This program provides: - Dance fitness activities Please contact program for more information. Servinganyone in need adults 18+ hiv/aids individuals families Call [phone]  Email [email or web address] to get more info  [employer] offers an online [employer] to individuals where they can receive help to focus on their best health. Whether you're a [employer] member or not, the neighborhood center offers  a... Passenger transport manager education  exercise & fitness  community support services  recreation  virtual support Other Servicessupport groups Servinganyone in need adults young adults teens seniors individuals families [email or web address] to get more info  Schedule on their website  [facility] offers an array of activities for adults age 4 and over. This program provides:- Fitness and health programs- Tech classes- Activity books Main Serviceshealth education  community support services  exercise & fitness  recreation  more education Servingseniors  Call [phone]    For more resources go online to [email or web address] and type in you zipcode

## 2024-04-22 NOTE — Plan of Care (Signed)
 Problem: Education: Goal: Ability to describe self-care measures that may prevent or decrease complications (Diabetes Survival Skills Education) will improve 04/22/2024 1400 by Jonathon Neighbors, RN Outcome: Adequate for Discharge 04/22/2024 1313 by Jonathon Neighbors, RN Outcome: Adequate for Discharge Goal: Individualized Educational Video(s) 04/22/2024 1400 by Jonathon Neighbors, RN Outcome: Adequate for Discharge 04/22/2024 1313 by Jonathon Neighbors, RN Outcome: Adequate for Discharge   Problem: Coping: Goal: Ability to adjust to condition or change in health will improve 04/22/2024 1400 by Jonathon Neighbors, RN Outcome: Adequate for Discharge 04/22/2024 1313 by Jonathon Neighbors, RN Outcome: Adequate for Discharge   Problem: Fluid Volume: Goal: Ability to maintain a balanced intake and output will improve 04/22/2024 1400 by Jonathon Neighbors, RN Outcome: Adequate for Discharge 04/22/2024 1313 by Jonathon Neighbors, RN Outcome: Adequate for Discharge   Problem: Health Behavior/Discharge Planning: Goal: Ability to identify and utilize available resources and services will improve 04/22/2024 1400 by Jonathon Neighbors, RN Outcome: Adequate for Discharge 04/22/2024 1313 by Jonathon Neighbors, RN Outcome: Adequate for Discharge Goal: Ability to manage health-related needs will improve 04/22/2024 1400 by Jonathon Neighbors, RN Outcome: Adequate for Discharge 04/22/2024 1313 by Jonathon Neighbors, RN Outcome: Adequate for Discharge   Problem: Metabolic: Goal: Ability to maintain appropriate glucose levels will improve 04/22/2024 1400 by Jonathon Neighbors, RN Outcome: Adequate for Discharge 04/22/2024 1313 by Jonathon Neighbors, RN Outcome: Adequate for Discharge   Problem: Nutritional: Goal: Maintenance of adequate nutrition will improve 04/22/2024 1400 by Jonathon Neighbors, RN Outcome: Adequate for Discharge 04/22/2024 1313 by Jonathon Neighbors, RN Outcome: Adequate for Discharge Goal: Progress toward achieving an  optimal weight will improve 04/22/2024 1400 by Jonathon Neighbors, RN Outcome: Adequate for Discharge 04/22/2024 1313 by Jonathon Neighbors, RN Outcome: Adequate for Discharge   Problem: Skin Integrity: Goal: Risk for impaired skin integrity will decrease 04/22/2024 1400 by Jonathon Neighbors, RN Outcome: Adequate for Discharge 04/22/2024 1313 by Jonathon Neighbors, RN Outcome: Adequate for Discharge   Problem: Tissue Perfusion: Goal: Adequacy of tissue perfusion will improve 04/22/2024 1400 by Jonathon Neighbors, RN Outcome: Adequate for Discharge 04/22/2024 1313 by Jonathon Neighbors, RN Outcome: Adequate for Discharge   Problem: Education: Goal: Knowledge of General Education information will improve Description: Including pain rating scale, medication(s)/side effects and non-pharmacologic comfort measures 04/22/2024 1400 by Jonathon Neighbors, RN Outcome: Adequate for Discharge 04/22/2024 1313 by Jonathon Neighbors, RN Outcome: Adequate for Discharge   Problem: Health Behavior/Discharge Planning: Goal: Ability to manage health-related needs will improve 04/22/2024 1400 by Jonathon Neighbors, RN Outcome: Adequate for Discharge 04/22/2024 1313 by Jonathon Neighbors, RN Outcome: Adequate for Discharge   Problem: Clinical Measurements: Goal: Ability to maintain clinical measurements within normal limits will improve 04/22/2024 1400 by Jonathon Neighbors, RN Outcome: Adequate for Discharge 04/22/2024 1313 by Jonathon Neighbors, RN Outcome: Adequate for Discharge Goal: Will remain free from infection 04/22/2024 1400 by Jonathon Neighbors, RN Outcome: Adequate for Discharge 04/22/2024 1313 by Jonathon Neighbors, RN Outcome: Adequate for Discharge Goal: Diagnostic test results will improve 04/22/2024 1400 by Jonathon Neighbors, RN Outcome: Adequate for Discharge 04/22/2024 1313 by Jonathon Neighbors, RN Outcome: Adequate for Discharge Goal: Respiratory complications will improve 04/22/2024 1400 by Jonathon Neighbors, RN Outcome:  Adequate for Discharge 04/22/2024 1313 by Jonathon Neighbors, RN Outcome: Adequate for Discharge Goal: Cardiovascular complication will be avoided 04/22/2024 1400 by Jonathon Neighbors, RN Outcome:  Adequate for Discharge 04/22/2024 1313 by Jonathon Neighbors, RN Outcome: Adequate for Discharge   Problem: Activity: Goal: Risk for activity intolerance will decrease 04/22/2024 1400 by Jonathon Neighbors, RN Outcome: Adequate for Discharge 04/22/2024 1313 by Jonathon Neighbors, RN Outcome: Adequate for Discharge   Problem: Nutrition: Goal: Adequate nutrition will be maintained 04/22/2024 1400 by Jonathon Neighbors, RN Outcome: Adequate for Discharge 04/22/2024 1313 by Jonathon Neighbors, RN Outcome: Adequate for Discharge   Problem: Coping: Goal: Level of anxiety will decrease 04/22/2024 1400 by Jonathon Neighbors, RN Outcome: Adequate for Discharge 04/22/2024 1313 by Jonathon Neighbors, RN Outcome: Adequate for Discharge   Problem: Elimination: Goal: Will not experience complications related to bowel motility 04/22/2024 1400 by Jonathon Neighbors, RN Outcome: Adequate for Discharge 04/22/2024 1313 by Jonathon Neighbors, RN Outcome: Adequate for Discharge Goal: Will not experience complications related to urinary retention 04/22/2024 1400 by Jonathon Neighbors, RN Outcome: Adequate for Discharge 04/22/2024 1313 by Jonathon Neighbors, RN Outcome: Adequate for Discharge   Problem: Pain Managment: Goal: General experience of comfort will improve and/or be controlled 04/22/2024 1400 by Jonathon Neighbors, RN Outcome: Adequate for Discharge 04/22/2024 1313 by Jonathon Neighbors, RN Outcome: Adequate for Discharge   Problem: Safety: Goal: Ability to remain free from injury will improve 04/22/2024 1400 by Jonathon Neighbors, RN Outcome: Adequate for Discharge 04/22/2024 1313 by Jonathon Neighbors, RN Outcome: Adequate for Discharge   Problem: Skin Integrity: Goal: Risk for impaired skin integrity will decrease 04/22/2024 1400 by Jonathon Neighbors, RN Outcome: Adequate for Discharge 04/22/2024 1313 by Jonathon Neighbors, RN Outcome: Adequate for Discharge

## 2024-04-22 NOTE — TOC Transition Note (Signed)
 Transition of Care [facility]) - Discharge Note   Patient Details  Name: Abigail Molina MRN: [id number] Date of Birth: [birth year]  [facility]) CM/SW Contact:  Loman Risk, RN Phone Number: 04/22/2024, 1:43 PM   Clinical Narrative:      Patient to discharge today DC transport arranged through [facility]  with [facility] notified of discharge   Per SDOH screen food, transportation, and social isolation resources added to AVS Barriers to Discharge: Continued Medical Work up   Patient Goals and CMS Choice     Choice offered to / list presented to : Patient      Discharge Placement                       Discharge Plan and Services Additional resources added to the After Visit Summary for       Post Acute Care Choice: Resumption of Svcs/PTA Provider                    HH Arranged: RN, PT, OT [facility] Agency: [facility] Date [facility] Agency Contacted: 04/21/24   Representative spoke with at [facility] Agency: Normand Beckwith  Social Drivers of Health (SDOH) Interventions SDOH Screenings   Food Insecurity: Food Insecurity Present (04/19/2024)  Housing: Low Risk  (04/19/2024)  Transportation Needs: Unmet Transportation Needs (04/19/2024)  Utilities: Not At Risk (04/19/2024)  Depression (PHQ2-9): High Risk (11/02/2023)  Financial Resource Strain: Low Risk  (12/16/2023)   Received from [facility] Care  Physical Activity: Insufficiently Active (01/09/2021)   [facility] from [facility], [facility]  Social Connections: Socially Isolated (04/19/2024)  Stress: Stress Concern Present (09/17/2022)   [facility] from [facility], [facility]  Tobacco Use: Low Risk  (04/18/2024)  Health Literacy: Low Risk  (01/09/2021)   [facility] from [facility], [facility]     Readmission Risk Interventions    04/21/2024    2:57 PM 04/21/2024   11:45 AM 03/18/2024    3:36 PM  Readmission Risk Prevention Plan  Transportation Screening Complete Complete Complete   Medication Review (RN Care Manager) Complete Complete Complete  PCP or Specialist appointment within 3-5 days of discharge Complete Complete   HRI or Home Care Consult Complete Complete Complete  SW Recovery Care/Counseling Consult Complete Complete   Palliative Care Screening Not Applicable Not Applicable   Skilled Nursing Facility Not Applicable Not Applicable Not Applicable

## 2024-04-22 NOTE — Discharge Summary (Signed)
 Physician Discharge Summary   Patient: Abigail Molina MRN: [id number] DOB: 1966-03-17  Admit date:     04/18/2024  Discharge date: 04/22/24  Discharge Physician: Lorita Rosa   PCP: [facility]   Recommendations at discharge:   Take salt tablets 3 times daily x 3 days Follow up with PCP next week for hospital follow up and recheck labs (BMP) You are being referred for TIPS evaluation Follow up with Dr. Braxton Calico (cardiology) on 5/30 as scheduled Hold Lasix  for now  Discharge Diagnoses: Principal Problem:   Acute gastroenteropathy due to Norovirus Active Problems:   Hyponatremia   Anxiety and depression   Type 2 diabetes mellitus with peripheral neuropathy (HCC)   Essential hypertension   Alcoholic cirrhosis of liver with ascites (HCC)   Dyslipidemia    Hospital Course: 57yo with h/o DM, anxiety/depression, cirrhosis with ascites 2* to NASH, HTN, HLD, and OSA who presented on 5/19 with n/v/d.  She was found to have Norovirus.    Assessment and Plan:  Norovirus infection Patient presented with abdominal pain She has h/o cirrhosis and concern was for SBP Given n/v/d, she was checked for C diff (negative) but GI pathogen panel showed that she has Norovirus infection Symptomatic treatment with Pepto Improving from this standpoint   Recurrent Ascites  Most recent paracentesis 03/22/24 with 3.6 L removed. Diagnostic paracentesis done by EDP Stop ceftriaxone , as this does not appear to be c/w SBP Pain control PRN Therapeutic paracentesis on 5/21 with 4L drained off Likely to meet criteria for TIPS soon (2 recent para's on 4/17, 4/22 and again on 5/21); she has been referred by IR   Hepatic cirrhosis with ascites, unspecified type Continue rifaximin , spironolactone  Hold Lasix  for now Monitor mental status for signs of hepatic encephalopathy Lactulose  prn  Hyponatremia Likely related to mild hypovolemia post-paracentesis in the setting of chronic hyponatremia from  cirrhosis Given 250cc bolus with improvement in BP (100/43 -> 128/61) Will also give salt tabs TID x 3 days Recheck BMP at f/u appointment   Dyslipidemia Continue rosuvastatin     Orthostatic hypotension Hold carvedilol Continue midodrine    Type 2 diabetes mellitus with peripheral neuropathy A1c 6.4, good control Resume Glucophage  Continue Lantus  and mealtime insulin  Carb modified diet  Continue Invokana (Jardiance per formulary) Continue gabapentin    Anxiety and depression Continue Wellbutrin  XL, Celexa , Remeron          Consultants: IR   Procedures: Diagnostic Paracentesis 5/19 Therapeutic Paracentesis 5/21   Antibiotics: Ceftriaxone  5/20-21    Pain control - [place]  Controlled Substance Reporting System database was reviewed. and patient was instructed, not to drive, operate heavy machinery, perform activities at heights, swimming or participation in water  activities or provide baby-sitting services while on Pain, Sleep and Anxiety Medications; until their outpatient Physician has advised to do so again. Also recommended to not to take more than prescribed Pain, Sleep and Anxiety Medications.    Disposition: Home Diet recommendation:  Carb modified diet DISCHARGE MEDICATION: Allergies as of 04/22/2024   No Known Allergies      Medication List     PAUSE taking these medications    furosemide  20 MG tablet Wait to take this until your doctor or other care provider tells you to start again. Commonly known as: LASIX  Take 1 tablet (20 mg total) by mouth 2 (two) times daily.       STOP taking these medications    carvedilol 6.25 MG tablet Commonly known as: COREG       TAKE  these medications    Accu-Chek Softclix Lancets lancets SMARTSIG:Topical   aspirin  81 MG tablet Take 81 mg by mouth daily.   Baclofen  5 MG Tabs Take 2 tablets by mouth at bedtime.   BD Buyer, retail Use as directed to dispose of Cosentyx pens.   bismuth  subsalicylate 262 MG/15ML suspension Commonly known as: PEPTO BISMOL Take 30 mLs by mouth every 4 (four) hours as needed for diarrhea or loose stools.   buPROPion  150 MG 24 hr tablet Commonly known as: WELLBUTRIN  XL Take 1 tablet (150 mg total) by mouth daily. What changed: when to take this   citalopram  20 MG tablet Commonly known as: CELEXA  Take 20 mg by mouth at bedtime.   Cosentyx Sensoready (300 MG) 150 MG/ML Soaj Generic drug: Secukinumab (300 MG Dose) Inject into the skin.   FreeStyle Libre 3 Sensor Misc by Does not apply route daily.   gabapentin  300 MG capsule Commonly known as: NEURONTIN  Take 300 mg by mouth 3 (three) times daily.   insulin  lispro 100 UNIT/ML injection Commonly known as: HUMALOG Inject 8 Units into the skin 3 (three) times daily with meals.   Insulin  Pen Needle 32G X 6 MM Misc 1 Syringe by Does not apply route. Use with Victoza .   BD Pen Needle Nano 2nd Gen 32G X 4 MM Misc Generic drug: Insulin  Pen Needle USE 1 NIGHTLY   Invokana 300 MG Tabs tablet Generic drug: canagliflozin Take 300 mg by mouth daily before breakfast.   lactulose  10 GM/15ML solution Commonly known as: CHRONULAC  Take 45 mLs (30 g total) by mouth 2 (two) times daily as needed for mild constipation or moderate constipation (titrate for 2-3 stools/day).   Lantus  SoloStar 100 UNIT/ML Solostar Pen Generic drug: insulin  glargine Inject 30 Units into the skin daily. What changed:  how much to take when to take this   levETIRAcetam  500 MG tablet Commonly known as: KEPPRA  Take 750 mg by mouth 2 (two) times daily.   MAGnesium -Oxide 400 (240 Mg) MG tablet Generic drug: magnesium  oxide Take 2 tablets by mouth 2 (two) times daily.   metFORMIN  500 MG 24 hr tablet Commonly known as: GLUCOPHAGE -XR Take 1,000 mg by mouth 2 (two) times daily.   midodrine  10 MG tablet Commonly known as: PROAMATINE  Take 1 tablet (10 mg total) by mouth 3 (three) times daily with meals.    mirtazapine  15 MG tablet Commonly known as: REMERON  Take 1 tablet (15 mg total) by mouth at bedtime.   naltrexone 50 MG tablet Commonly known as: DEPADE Take 50 mg by mouth at bedtime.   ondansetron  4 MG disintegrating tablet Commonly known as: ZOFRAN -ODT Dissolve 1 tablet (4 mg total) in the mouth every twelve (12) hours as needed for nausea.   oxyCODONE  5 MG immediate release tablet Commonly known as: Oxy IR/ROXICODONE  Take 1 tablet (5 mg total) by mouth every 6 (six) hours as needed for moderate pain (pain score 4-6).   pantoprazole  40 MG tablet Commonly known as: PROTONIX  TAKE ONE TABLET BY MOUTH EVERY DAY   promethazine 12.5 MG tablet Commonly known as: PHENERGAN Take 12.5 mg by mouth every 4 (four) hours as needed for nausea or vomiting.   rosuvastatin  20 MG tablet Commonly known as: CRESTOR  Take 1 tablet (20 mg total) by mouth daily. What changed: when to take this   scopolamine 1 MG/3DAYS Commonly known as: TRANSDERM-SCOP Place 1 patch onto the skin every 3 (three) days.   Slow Fe 142 (45 Fe) MG Tbcr Generic  drug: Ferrous Sulfate  Take 1 tablet by mouth daily.   sodium chloride  1 g tablet Take 1 tablet (1 g total) by mouth 3 (three) times daily with meals for 3 days.   spironolactone  50 MG tablet Commonly known as: ALDACTONE  Take 1 tablet (50 mg total) by mouth 2 (two) times daily.   thiamine  100 MG tablet Commonly known as: VITAMIN B1 Take 1 tablet (100 mg total) by mouth daily.   Xifaxan  550 MG Tabs tablet Generic drug: rifaximin  Take 550 mg by mouth 2 (two) times daily.   Zinc  Sulfate 220 (50 Zn) MG Tabs Take 1 tablet (220 mg total) by mouth daily.        Follow-up Information     Custovic, Sabina, DO Follow up.   Specialty: Cardiology Why: Appointment scheduled for 5/30 at 10:15 AM with Dr. Adeline Africa information: [address] [place] [address] [phone]                Discharge Exam:   Subjective:  Diarrhea has essentially resolved.  Still having some abdominal pain that is manageable with PO medications.   Has ascites drained yesterday and feels better.  Overall, improved and wants to go home today.   Objective: Vitals:   04/22/24 0437 04/22/24 0855  BP: (!) 109/59 128/61  Pulse: 90   Resp: 20   Temp: 98.2 F (36.8 C) 97.8 F (36.6 C)  SpO2: 95%     Intake/Output Summary (Last 24 hours) at 04/22/2024 1240 Last data filed at 04/22/2024 1041 Gross per 24 hour  Intake 1320 ml  Output --  Net 1320 ml   Filed Weights   04/18/24 1727  Weight: 77.1 kg    Exam:  General:  Appears calm and comfortable and is in NAD Eyes:  normal lids, iris ENT:  grossly normal hearing, lips & tongue, mmm Cardiovascular:  RRR. No LE edema.  Respiratory:   CTA bilaterally with no wheezes/rales/rhonchi.  Normal respiratory effort. Abdomen:  soft, less TTP, improved distention Skin:  diffuse psoriasis particularly on abdomen Musculoskeletal:  grossly normal tone BUE/BLE, good ROM, no bony abnormality Psychiatric:  grossly normal mood and affect, speech fluent and appropriate, AOx3 Neurologic:  CN 2-12 grossly intact, moves all extremities in coordinated fashion, sensation intact  Data Reviewed: I have reviewed the patient's lab results since admission.  Pertinent labs for today include:   Na++ 124 Glucose 253 AP 334 Albumin  2.1 AST 44/ALT 18/Bili 2.9 WBC 3.2 Hgb 8.6, stable    Condition at discharge: improving  The results of significant diagnostics from this hospitalization (including imaging, microbiology, ancillary and laboratory) are listed below for reference.   Imaging Studies: US  Paracentesis Result Date: 04/21/2024 INDICATION: 58 year old female with a history of NASH liver cirrhosis who presented to the ED with shortness of breath and abdominal pain for the past week. Request for therapeutic paracentesis. EXAM: ULTRASOUND GUIDED left PARACENTESIS MEDICATIONS: 1% lidocaine ,  6 mL COMPLICATIONS: None immediate. PROCEDURE: Informed written consent was obtained from the patient after a discussion of the risks, benefits and alternatives to treatment. A timeout was performed prior to the initiation of the procedure. Initial ultrasound scanning demonstrates a large amount of ascites within the right lower abdominal quadrant. The right lower abdomen was prepped and draped in the usual sterile fashion. 1% lidocaine  was used for local anesthesia. Following this, a 19 gauge, 7-cm, Yueh catheter was introduced. An ultrasound image was saved for documentation purposes. The paracentesis was performed. The catheter was removed and  a dressing was applied. The patient tolerated the procedure well without immediate post procedural complication. FINDINGS: A total of approximately 4 L of hazy yellow fluid was removed. IMPRESSION: Successful ultrasound-guided paracentesis yielding 4 liters of peritoneal fluid. Procedure performed by: Estella Helling, PA-C PLAN: The patient has required >/=2 paracenteses in a 30 day period and a formal evaluation by the [facility] has been arranged. Electronically Signed   By: Erica Hau M.D.   On: 04/21/2024 07:59   CT ABDOMEN PELVIS W CONTRAST Result Date: 04/18/2024 CLINICAL DATA:  Right lower quadrant pain EXAM: CT ABDOMEN AND PELVIS WITH CONTRAST TECHNIQUE: Multidetector CT imaging of the abdomen and pelvis was performed using the standard protocol following bolus administration of intravenous contrast. RADIATION DOSE REDUCTION: This exam was performed according to the departmental dose-optimization program which includes automated exposure control, adjustment of the mA and/or kV according to patient size and/or use of iterative reconstruction technique. CONTRAST:  75mL OMNIPAQUE  IOHEXOL  300 MG/ML  SOLN COMPARISON:  01/26/2024 FINDINGS: Lower chest: Small moderate left pleural effusion, slightly increased  since prior study. Compressive atelectasis in the left lower lobe. Right lung base clear. Diffuse 3 vessel coronary artery disease. Hepatobiliary: Severely nodular shrunken liver compatible with cirrhosis. Prior cholecystectomy. No focal hepatic abnormality or biliary ductal dilatation. Pancreas: No focal abnormality or ductal dilatation. Spleen: Splenomegaly with a craniocaudal length of 18.7 cm. Scattered subcentimeter hypodensities throughout the spleen, similar prior study. Adrenals/Urinary Tract: No adrenal abnormality. No focal renal abnormality. No stones or hydronephrosis. Urinary bladder is unremarkable. Stomach/Bowel: Stomach, large and small bowel grossly unremarkable. Vascular/Lymphatic: Aortic atherosclerosis. No evidence of aneurysm or adenopathy. Reproductive: Prior hysterectomy.  No adnexal masses. Other: Large volume ascites Musculoskeletal: No acute bony abnormality. IMPRESSION: Changes of cirrhosis with splenomegaly and large volume ascites. Small to moderate left pleural effusion, increased since prior study. Compressive atelectasis in the left lower lobe. Coronary artery disease, aortic atherosclerosis. Electronically Signed   By: Janeece Mechanic M.D.   On: 04/18/2024 22:27    Microbiology: Results for orders placed or performed during the hospital encounter of 04/18/24  Urine Culture (for pregnant, neutropenic or urologic patients or patients with an indwelling urinary catheter)     Status: Abnormal   Collection Time: 04/18/24  5:32 PM   Specimen: Urine, Clean Catch  Result Value Ref Range Status   Specimen Description   Final    URINE, CLEAN CATCH Performed at [facility], [address] [place], [place] [address]    Special Requests   Final    NONE Performed at [facility], [address] [place], [place] [address]    Culture MULTIPLE SPECIES PRESENT, SUGGEST RECOLLECTION (A)  Final   Report Status 04/20/2024 FINAL  Final  Peritoneal fluid culture w  Gram Stain     Status: None   Collection Time: 04/19/24 12:00 AM   Specimen: Peritoneal Washings; Peritoneal Fluid  Result Value Ref Range Status   Specimen Description   Final    PERITONEAL Performed at [facility], [address], [place], [place] [address]    Special Requests   Final    NONE Performed at [facility], [address] [place], [place] [address]    Gram Stain NO WBC SEEN NO ORGANISMS SEEN   Final   Culture   Final    NO GROWTH 3 DAYS Performed at [facility], [address], [place], [place] [address]    Report Status 04/22/2024  FINAL  Final  C Difficile Quick Screen w PCR reflex     Status: None   Collection Time: 04/19/24 11:04 AM   Specimen: Stool  Result Value Ref Range Status   C Diff antigen NEGATIVE NEGATIVE Final   C Diff toxin NEGATIVE NEGATIVE Final   C Diff interpretation No C. difficile detected.  Final    Comment: Performed at [facility], [address] [place], [place] [address]  Gastrointestinal Panel by PCR , Stool     Status: Abnormal   Collection Time: 04/19/24 11:04 AM   Specimen: Stool  Result Value Ref Range Status   Campylobacter species NOT DETECTED NOT DETECTED Final   Plesimonas shigelloides NOT DETECTED NOT DETECTED Final   Salmonella species NOT DETECTED NOT DETECTED Final   Yersinia enterocolitica NOT DETECTED NOT DETECTED Final   Vibrio species NOT DETECTED NOT DETECTED Final   Vibrio cholerae NOT DETECTED NOT DETECTED Final   Enteroaggregative E coli (EAEC) NOT DETECTED NOT DETECTED Final   Enteropathogenic E coli (EPEC) NOT DETECTED NOT DETECTED Final   Enterotoxigenic E coli (ETEC) NOT DETECTED NOT DETECTED Final   Shiga like toxin producing E coli (STEC) NOT DETECTED NOT DETECTED Final   Shigella/Enteroinvasive E coli (EIEC) NOT DETECTED NOT DETECTED Final   Cryptosporidium NOT DETECTED NOT DETECTED Final   Cyclospora cayetanensis NOT DETECTED NOT DETECTED Final   Entamoeba  histolytica NOT DETECTED NOT DETECTED Final   Giardia lamblia NOT DETECTED NOT DETECTED Final   Adenovirus F40/41 NOT DETECTED NOT DETECTED Final   Astrovirus NOT DETECTED NOT DETECTED Final   Norovirus GI/GII DETECTED (A) NOT DETECTED Final    Comment: RESULT CALLED TO, READ BACK BY AND VERIFIED WITH: Leory Rands NELSON 04/19/24 1347 MW    Rotavirus A NOT DETECTED NOT DETECTED Final   Sapovirus (I, II, IV, and V) NOT DETECTED NOT DETECTED Final    Comment: Performed at [facility], [address] [place], [place] [address]    Labs: CBC: Recent Labs  Lab 04/18/24 1732 04/19/24 0735 04/20/24 0558 04/21/24 0514 04/22/24 0351  WBC 4.3 3.5* 3.1* 2.9* 3.2*  NEUTROABS  --   --   --  1.5* 1.7  HGB 8.8* 8.7* 9.3* 8.5* 8.6*  HCT 29.7* 28.9* 31.4* 28.1* 29.0*  MCV 75.8* 75.5* 75.1* 74.9* 75.7*  PLT 173 165 190 165 165   Basic Metabolic Panel: Recent Labs  Lab 04/18/24 1732 04/19/24 0638 04/20/24 0558 04/21/24 0514 04/22/24 0351  NA 129* 132* 129* 133* 124*  K 3.6 3.8 3.5 3.7 3.9  CL 96* 101 97* 102 95*  CO2 27 23 23 24 22   GLUCOSE 237* 122* 225* 240* 253*  BUN 17 16 16 16 16   CREATININE 0.78 0.61 0.83 0.70 0.62  CALCIUM  8.8* 8.4* 8.7* 8.7* 8.0*  MG  --   --  1.9  --   --    Liver Function Tests: Recent Labs  Lab 04/18/24 1732 04/20/24 0558 04/21/24 0514 04/22/24 0351  AST 44* 51* 43* 44*  ALT 20 21 19 18   ALKPHOS 323* 318* 285* 334*  BILITOT 4.1* 4.2* 3.2* 2.9*  PROT 7.8 7.3 6.5 7.0  ALBUMIN  2.5* 2.2* 2.1* 2.1*   CBG: Recent Labs  Lab 04/21/24 1210 04/21/24 1610 04/21/24 2142 04/22/24 0753 04/22/24 1137  GLUCAP 207* 245* 234* 191* 271*    Discharge time spent: greater than 30 minutes.  Signed: Lorita Rosa, MD [facility] 04/22/2024

## 2024-04-26 ENCOUNTER — Ambulatory Visit: Admit: 2024-04-26 | Discharge: 2024-04-27 | Payer: Medicaid (Managed Care)

## 2024-04-26 DIAGNOSIS — K7210 Chronic hepatic failure without coma: Principal | ICD-10-CM

## 2024-04-26 LAB — COMPREHENSIVE METABOLIC PANEL
ALBUMIN: 2.3 g/dL — ABNORMAL LOW (ref 3.4–5.0)
ALKALINE PHOSPHATASE: 442 U/L — ABNORMAL HIGH (ref 46–116)
ALT (SGPT): 32 U/L (ref 10–49)
ANION GAP: 8 mmol/L (ref 5–14)
AST (SGOT): 81 U/L — ABNORMAL HIGH (ref <=34)
BILIRUBIN TOTAL: 4.3 mg/dL — ABNORMAL HIGH (ref 0.3–1.2)
BLOOD UREA NITROGEN: 16 mg/dL (ref 9–23)
BUN / CREAT RATIO: 21
CALCIUM: 9.1 mg/dL (ref 8.7–10.4)
CHLORIDE: 96 mmol/L — ABNORMAL LOW (ref 98–107)
CO2: 29 mmol/L (ref 20.0–31.0)
CREATININE: 0.78 mg/dL (ref 0.55–1.02)
EGFR CKD-EPI (2021) FEMALE: 88 mL/min/1.73m2 (ref >=60)
GLUCOSE RANDOM: 168 mg/dL (ref 70–179)
POTASSIUM: 4.2 mmol/L (ref 3.5–5.1)
PROTEIN TOTAL: 8 g/dL (ref 5.7–8.2)
SODIUM: 133 mmol/L — ABNORMAL LOW (ref 135–145)

## 2024-04-26 LAB — AFP TUMOR MARKER: AFP-TUMOR MARKER: 3 ng/mL (ref <=8)

## 2024-04-26 LAB — CBC
HEMATOCRIT: 31.2 % — ABNORMAL LOW (ref 34.0–44.0)
HEMOGLOBIN: 10 g/dL — ABNORMAL LOW (ref 11.3–14.9)
MEAN CORPUSCULAR HEMOGLOBIN CONC: 32 g/dL (ref 32.0–36.0)
MEAN CORPUSCULAR HEMOGLOBIN: 22.9 pg — ABNORMAL LOW (ref 25.9–32.4)
MEAN CORPUSCULAR VOLUME: 71.4 fL — ABNORMAL LOW (ref 77.6–95.7)
MEAN PLATELET VOLUME: 7.3 fL (ref 6.8–10.7)
PLATELET COUNT: 205 10*9/L (ref 150–450)
RED BLOOD CELL COUNT: 4.36 10*12/L (ref 3.95–5.13)
RED CELL DISTRIBUTION WIDTH: 23.6 % — ABNORMAL HIGH (ref 12.2–15.2)
WBC ADJUSTED: 3.9 10*9/L (ref 3.6–11.2)

## 2024-04-26 LAB — PROTIME-INR
INR: 1.27
PROTIME: 14.5 s — ABNORMAL HIGH (ref 9.9–12.6)

## 2024-04-26 NOTE — Unmapped (Signed)
 On Apr 26, 2024 received referral from Davari, Danielle R, MD and  Dr. Curlene Dotter for liver transplant evaluation. Ms. Cammarata is a 58 y.o. female with diagnosis of End Stage Liver Disease secondary to [facility]  Acceptable referral.  Referral entered in EPIC. Request will be sent to the [facility] for insurance verification.

## 2024-04-26 NOTE — Unmapped (Addendum)
 [facility]  [facility]  Randall Bush, MD   [facility]  [address]  [place], [place] [address]  Main Clinic: [phone]  Appointment Schedulers: [phone]       Thank you for allowing me to participate in your medical care today. Here is a summary and recommendations based on our visit.    YOUR PLAN:  -DECOMPENSATED CIRRHOSIS: Decompensated cirrhosis is advanced liver disease with severe complications. Your MELD score is 24, indicating significant liver damage. We will initiate a liver transplant evaluation and continue your current medications, including diuretics, lactulose , rifaximin , and naltrexone . We advise against the TIPS procedure due to potential risks and encourage you to follow sodium and fluid restrictions. We will coordinate with the transplant coordinator for your evaluation process.    INSTRUCTIONS:  We will initiate the liver transplant evaluation process for both living and deceased donor options. Labs will be ordered to assess your current liver function and tumor markers. Please continue with your current medications and follow the advised sodium and fluid restrictions: Less than 2000 mg of sodium a day, less then 2 liters of water  a day. Follow up for the esophageal swallowing tests and a barium swallow to address your dysphagia. Coordinate with the transplant coordinator for your evaluation process.    Take care,  Aluna Whiston K Yola Paradiso, MD

## 2024-04-26 NOTE — Unmapped (Signed)
 [facility]  04/26/2024    Reason for visit: Follow up for Other cirrhosis of liver  [K74.69]    Assessment & Plan  58 y.o. 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 Current MELD is 20, up from 13 last year. Multiple recent hospitalizations, including sepsis and fluid overload. She has indications for liver transplantation and I recommend that she begin an evaluation.    Other cirrhosis of liver    Initiate liver transplant evaluation and explore living donor options  Note that recent CT shows coronary atherosclerosis  HCC surveillance  Up to date on vaccinations  Will need RSV this winter, along with annual influenza and COVID-19    Portal hypertension - Secondary esophageal varices without bleeding    EGD scheduled this week  Intolerant of carvedilol  due to hypotension, on midodrine    Other ascites  Sodium restriction, <2 g/d  Continue diuretics, limited by hyponatremia despite normal renal function  Paracentesis as needed for comfort  Avoid TIPS due to high MELD/bilirubin and relatively infrequent need for paracentesis    Hyponatremia  Free water  restriction, <2 L/d    Hepatic encephalopathy    Continue lactulose  and rifaximin     Generalized pruritus  Continue naltrexone     Dysphagia, unspecified type  Follow up with [facility]       Return in about 3 months (around 07/27/2024).    Subjective   Accompanied by: cousin, Ryan    History of Present Illness  Ashley Briggs is a 58 year old female with decompensated cirrhosis who presents for follow-up.    Since her last clinic visit in February 2024, she has experienced multiple hospitalizations and emergency department visits. In March 2025, she was hospitalized for septic shock due to Klebsiella UTI, pneumonia, and NSTEMI, requiring mechanical intubation. In April 2025, she was hospitalized for fluid overload, undergoing large volume paracentesis and IV diuresis, after which her diuretics were decreased. In May 2025, she was hospitalized with norovirus.    She continues to experience fluid retention, necessitating large volume paracentesis. The most recent paracentesis was last week, with approximately 4 liters of fluid removed. The previous paracentesis was about a month and a half prior. She reports that she is being referred for TIPS. She has a history of hepatic encephalopathy and experiences ongoing confusion and memory issues. She is currently taking lactulose  and rifaximin  for management. She had grade 2 esophageal varices banded during an upper endoscopy for anemia and melena in April 2025. No further bleeding since her last hospitalization. She was previously on carvedilol , which was discontinued due to hypotension. She is currently taking midodrine. She experiences severe pruritus, for which naltrexone  was initiated since her last clinic visit. She reports slight improvement. Cholestyramine  was previously tried but was ineffective. She reports dysphagia, with pills getting stuck in her throat, and is scheduled for esophageal manometry and a barium swallow. She is up to date on her vaccines, including COVID, influenza, pneumonia, tetanus, and shingles.    Objective   BP 102/61  - Pulse 91  - Temp 37.2 ??C (98.9 ??F) (Tympanic)  - Ht 162.6 cm (5' 4.02)  - Wt 87.3 kg (192 lb 8 oz)  - LMP 08/03/2006  - SpO2 100%  - BMI 33.03 kg/m??   Physical Exam  Constitutional: She is in no apparent distress  Eyes: Icteric sclerae  Cardiovascular: No peripheral edema  Gastrointestinal: Soft, nontender, distended abdomen  Neurologic: Awake, alert, and oriented  to person, place, and time with normal speech and no asterixis   Skin: Spider angiomata, psoriatic plaques on abdomen     Results  Results for orders placed or performed in visit on 04/26/24   CBC   Result Value Ref Range    WBC 3.9 3.6 - 11.2 10*9/L    RBC 4.36 3.95 - 5.13 10*12/L    HGB 10.0 (L) 11.3 - 14.9 g/dL    HCT 09.8 (L) 11.9 - 44.0 %    MCV 71.4 (L) 77.6 - 95.7 fL    MCH 22.9 (L) 25.9 - 32.4 pg    MCHC 32.0 32.0 - 36.0 g/dL    RDW 14.7 (H) 82.9 - 15.2 %    MPV 7.3 6.8 - 10.7 fL    Platelet 205 150 - 450 10*9/L   PT-INR   Result Value Ref Range    PT 14.5 (H) 9.9 - 12.6 sec    INR 1.27    Comprehensive Metabolic Panel   Result Value Ref Range    Sodium 133 (L) 135 - 145 mmol/L    Potassium 4.2 3.5 - 5.1 mmol/L    Chloride 96 (L) 98 - 107 mmol/L    CO2 29.0 20.0 - 31.0 mmol/L    Anion Gap 8 5 - 14 mmol/L    BUN 16 9 - 23 mg/dL    Creatinine 5.62 1.30 - 1.02 mg/dL    BUN/Creatinine Ratio 21     eGFR CKD-EPI (2021) Female 88 >=60 mL/min/1.50m2    Glucose 168 70 - 179 mg/dL    Calcium  9.1 8.7 - 10.4 mg/dL    Albumin 2.3 (L) 3.4 - 5.0 g/dL    Total Protein 8.0 5.7 - 8.2 g/dL    Total Bilirubin 4.3 (H) 0.3 - 1.2 mg/dL    AST 81 (H) <=86 U/L    ALT 32 10 - 49 U/L    Alkaline Phosphatase 442 (H) 46 - 116 U/L   AFP tumor marker   Result Value Ref Range    AFP-Tumor Marker 3 <=8 ng/mL     MELD 3.0: 20 at 04/26/2024  1:08 PM  Calculated from:  Serum Creatinine: 0.78 mg/dL (Using min of 1 mg/dL) at 5/78/4696  2:95 PM  Serum Sodium: 133 mmol/L at 04/26/2024  1:08 PM  Total Bilirubin: 4.3 mg/dL at 2/84/1324  4:01 PM  Serum Albumin: 2.3 g/dL at 0/27/2536  6:44 PM  INR(ratio): 1.27 at 04/26/2024  1:08 PM  Age at listing (hypothetical): 57 years  Sex: Female at 04/26/2024  1:08 PM     Echocardiogram 01/26/2024 [facility]):   1. Left ventricular ejection fraction, by estimation, is >55%. The left ventricle has normal function. Left ventricular endocardial border not optimally defined to evaluate regional wall motion. Left ventricular diastolic parameters were normal.    2. Right ventricular systolic function is normal. The right ventricular size is normal. Tricuspid regurgitation signal is inadequate for assessing PA pressure.    3. The mitral valve is normal in structure. No evidence of mitral valve regurgitation. No evidence of mitral stenosis.    4. The aortic valve was not well visualized. Aortic valve regurgitation is not visualized. No aortic stenosis is present.     EGD 03/22/2024 [facility]):  Impression: - Grade II esophageal varices. Incompletely   eradicated. Banded.  - Portal hypertensive gastropathy.  - Normal examined duodenum.  - No specimens collected.     CT 04/18/2024 [facility]):  Changes of cirrhosis with splenomegaly and  large volume ascites.   Small to moderate left pleural effusion, increased since prior   study. Compressive atelectasis in the left lower lobe.   Coronary artery disease, aortic atherosclerosis.        I personally spent 45 minutes face-to-face and non-face-to-face in the care of this patient, which includes all pre, intra, and post visit time on the date of service

## 2024-04-28 ENCOUNTER — Ambulatory Visit: Admitting: Anesthesiology

## 2024-04-28 ENCOUNTER — Encounter
Admission: RE | Disposition: A | Discharge status: Home / Self Care | Payer: Self-pay | Source: Ambulatory Visit | Attending: Gastroenterology

## 2024-04-28 ENCOUNTER — Other Ambulatory Visit: Payer: Self-pay

## 2024-04-28 ENCOUNTER — Encounter: Payer: Self-pay | Admitting: Gastroenterology

## 2024-04-28 ENCOUNTER — Ambulatory Visit
Admission: RE | Admit: 2024-04-28 | Discharge: 2024-04-28 | Disposition: A | Discharge status: Home / Self Care | Source: Ambulatory Visit | Attending: Gastroenterology | Admitting: Gastroenterology

## 2024-04-28 DIAGNOSIS — I252 Old myocardial infarction: Secondary | ICD-10-CM | POA: Insufficient documentation

## 2024-04-28 DIAGNOSIS — G4733 Obstructive sleep apnea (adult) (pediatric): Secondary | ICD-10-CM | POA: Diagnosis not present

## 2024-04-28 DIAGNOSIS — E1142 Type 2 diabetes mellitus with diabetic polyneuropathy: Secondary | ICD-10-CM | POA: Diagnosis not present

## 2024-04-28 DIAGNOSIS — I10 Essential (primary) hypertension: Secondary | ICD-10-CM | POA: Diagnosis not present

## 2024-04-28 DIAGNOSIS — I8500 Esophageal varices without bleeding: Secondary | ICD-10-CM | POA: Diagnosis not present

## 2024-04-28 DIAGNOSIS — R569 Unspecified convulsions: Secondary | ICD-10-CM | POA: Insufficient documentation

## 2024-04-28 DIAGNOSIS — E669 Obesity, unspecified: Secondary | ICD-10-CM | POA: Diagnosis not present

## 2024-04-28 DIAGNOSIS — K7460 Unspecified cirrhosis of liver: Secondary | ICD-10-CM | POA: Diagnosis not present

## 2024-04-28 DIAGNOSIS — I8511 Secondary esophageal varices with bleeding: Secondary | ICD-10-CM

## 2024-04-28 DIAGNOSIS — Z6833 Body mass index (BMI) 33.0-33.9, adult: Secondary | ICD-10-CM | POA: Diagnosis not present

## 2024-04-28 DIAGNOSIS — K7581 Nonalcoholic steatohepatitis (NASH): Secondary | ICD-10-CM | POA: Diagnosis not present

## 2024-04-28 DIAGNOSIS — Z794 Long term (current) use of insulin: Secondary | ICD-10-CM | POA: Insufficient documentation

## 2024-04-28 DIAGNOSIS — F419 Anxiety disorder, unspecified: Secondary | ICD-10-CM | POA: Diagnosis not present

## 2024-04-28 DIAGNOSIS — F32A Depression, unspecified: Secondary | ICD-10-CM | POA: Diagnosis not present

## 2024-04-28 DIAGNOSIS — I2510 Atherosclerotic heart disease of native coronary artery without angina pectoris: Secondary | ICD-10-CM | POA: Insufficient documentation

## 2024-04-28 DIAGNOSIS — I8510 Secondary esophageal varices without bleeding: Secondary | ICD-10-CM | POA: Insufficient documentation

## 2024-04-28 HISTORY — PX: ESOPHAGEAL BANDING: SHX5518

## 2024-04-28 HISTORY — PX: ESOPHAGOGASTRODUODENOSCOPY: SHX5428

## 2024-04-28 LAB — GLUCOSE, CAPILLARY: Glucose-Capillary: 213 mg/dL — ABNORMAL HIGH (ref 70–99)

## 2024-04-28 SURGERY — EGD (ESOPHAGOGASTRODUODENOSCOPY)
Anesthesia: General

## 2024-04-28 MED ORDER — LIDOCAINE HCL (CARDIAC) PF 100 MG/5ML IV SOSY
PREFILLED_SYRINGE | INTRAVENOUS | Status: DC | PRN
  Administered 2024-04-28: 100 mg via INTRAVENOUS

## 2024-04-28 MED ORDER — PHENYLEPHRINE HCL (PRESSORS) 10 MG/ML IV SOLN
INTRAVENOUS | Status: DC | PRN
  Administered 2024-04-28: 160 ug via INTRAVENOUS

## 2024-04-28 MED ORDER — GLYCOPYRROLATE 0.2 MG/ML IJ SOLN
INTRAMUSCULAR | Status: DC | PRN
  Administered 2024-04-28: .2 mg via INTRAVENOUS

## 2024-04-28 MED ORDER — MIDAZOLAM HCL 2 MG/2ML IJ SOLN
INTRAMUSCULAR | Status: AC
  Filled 2024-04-28: qty 2

## 2024-04-28 MED ORDER — MIDAZOLAM HCL 2 MG/2ML IJ SOLN
INTRAMUSCULAR | Status: DC | PRN
  Administered 2024-04-28: 2 mg via INTRAVENOUS

## 2024-04-28 MED ORDER — PROPOFOL 10 MG/ML IV BOLUS
INTRAVENOUS | Status: DC | PRN
  Administered 2024-04-28: 30 mg via INTRAVENOUS
  Administered 2024-04-28: 70 mg via INTRAVENOUS
  Administered 2024-04-28: 30 mg via INTRAVENOUS

## 2024-04-28 MED ORDER — ONDANSETRON HCL 4 MG/2ML IJ SOLN
INTRAMUSCULAR | Status: DC | PRN
Start: 2024-04-28 — End: 2024-04-28
  Administered 2024-04-28: 4 mg via INTRAVENOUS

## 2024-04-28 MED ORDER — SODIUM CHLORIDE 0.9 % IV SOLN: INTRAVENOUS | Status: DC

## 2024-04-28 MED ORDER — PROPOFOL 500 MG/50ML IV EMUL
INTRAVENOUS | Status: DC | PRN
  Administered 2024-04-28: 145 ug/kg/min via INTRAVENOUS

## 2024-04-28 NOTE — Op Note (Signed)
 [facility]astroenterology Patient Name: Abigail Molina Procedure Date: 04/28/2024 8:53 AM MRN: [id number] Account #: [id number] Date of Birth: 10-22-1966 Admit Type: Outpatient Age: 58 Room: [facility] ENDO ROOM 3 Gender: Female Note Status: Finalized Instrument Name: Upper Endoscope [id number] Procedure:             Upper GI endoscopy Indications:           Esophageal varices Providers:             [name] MD, MD Referring MD:          [facility] [place] (Referring MD) Medicines:             Propofol  per Anesthesia Complications:         No immediate complications. Procedure:             Pre-Anesthesia Assessment:                        - Prior to the procedure, a History and Physical was                         performed, and patient medications and allergies were                         reviewed. The patient's tolerance of previous                         anesthesia was also reviewed. The risks and benefits                         of the procedure and the sedation options and risks                         were discussed with the patient. All questions were                         answered, and informed consent was obtained. Prior                         Anticoagulants: The patient has taken no anticoagulant                         or antiplatelet agents. ASA Grade Assessment: II - A                         patient with mild systemic disease. After reviewing                         the risks and benefits, the patient was deemed in                         satisfactory condition to undergo the procedure.                        After obtaining informed consent, the endoscope was                         passed under direct vision. Throughout the procedure,  the patient's blood pressure, pulse, and oxygen                         saturations were monitored continuously. The Endoscope                         was introduced through the mouth, and  advanced to the                         second part of duodenum. The upper GI endoscopy was                         accomplished without difficulty. The patient tolerated                         the procedure well. Findings:      Grade III varices were found in the lower third of the esophagus. Four       bands were successfully placed with complete eradication, resulting in       deflation of varices. There was no bleeding during the procedure.      A large amount of food (residue) was found in the entire examined       stomach.      The examined duodenum was normal. Impression:            - Grade III esophageal varices. Completely eradicated.                         Banded.                        - A large amount of food (residue) in the stomach.                        - Normal examined duodenum.                        - No specimens collected. Recommendation:        - Discharge patient to home.                        - Soft diet for 3 days.                        - Continue present medications.                        - Follow up at [facility] hepatology Procedure Code(s):     --- Professional ---                        [phone], Esophagogastroduodenoscopy, flexible,                         transoral; with band ligation of esophageal/gastric                         varices Diagnosis Code(s):     --- Professional ---                        I85.00, Esophageal varices without  bleeding CPT copyright 2022 American Medical Association. All rights reserved. The codes documented in this report are preliminary and upon coder review may  be revised to meet current compliance requirements. [name] MD, MD 04/28/2024 9:09:30 AM This report has been signed electronically. Number of Addenda: 0 Note Initiated On: 04/28/2024 8:53 AM Estimated Blood Loss:  Estimated blood loss: none.      [facility]

## 2024-04-28 NOTE — Anesthesia Postprocedure Evaluation (Signed)
 Anesthesia Post Note  Patient: Abigail Molina  Procedure(s) Performed: EGD (ESOPHAGOGASTRODUODENOSCOPY) ESOPHAGOSCOPY, WITH ESOPHAGEAL VARICES BAND LIGATION  Patient location during evaluation: PACU Anesthesia Type: General Level of consciousness: awake and alert, oriented and patient cooperative Pain management: pain level controlled Vital Signs Assessment: post-procedure vital signs reviewed and stable Respiratory status: spontaneous breathing, nonlabored ventilation and respiratory function stable Cardiovascular status: blood pressure returned to baseline and stable Postop Assessment: adequate PO intake Anesthetic complications: no   No notable events documented.   Last Vitals:  Vitals:   04/28/24 0920 04/28/24 0930  BP: 137/75 131/73  Pulse:    Resp:    Temp:    SpO2:      Last Pain:  Vitals:   04/28/24 0930  TempSrc:   PainSc: 2                  Jolina Symonds

## 2024-04-28 NOTE — H&P (Signed)
 [name], MD [facility] [address], [address] [place], [place] [address] [phone] Fax : [phone]  Primary Care Physician:  [facility] G[facility]enterologist:  Dr. Ole Berkeley  Pre-Procedure History & Physical: HPI:  Abigail Molina is a 58 y.o. female is here for an endoscopy.   Past Medical History:  Diagnosis Date   Anemia    Anxiety    Arthritis    Cancer (HCC)    Endometrial cancer   Chronic pain    Cirrhosis of liver (HCC)    Depression    Diabetes mellitus without complication (HCC)    Fatty liver disease, nonalcoholic 2021   GERD (gastroesophageal reflux disease)    Glaucoma    Hyperlipidemia    Hypertension    Meningioma (HCC)    NSTEMI (non-ST elevated myocardial infarction) (HCC) 02/03/2024   OSA (obstructive sleep apnea) 05/08/2023   Overactive bladder    Pharmacologic therapy 11/02/2023   Psoriasis 2015    Past Surgical History:  Procedure Laterality Date   ABDOMINAL HYSTERECTOMY  2007   CHOLECYSTECTOMY  2000   ESOPHAGOGASTRODUODENOSCOPY N/A 03/22/2024   Procedure: EGD (ESOPHAGOGASTRODUODENOSCOPY);  Surgeon: [name], MD;  Location: [facility] ENDOSCOPY;  Service: Endoscopy;  Laterality: N/A;    Prior to Admission medications   Medication Sig Start Date End Date Taking? Authorizing Provider  Accu-Chek Softclix Lancets lancets SMARTSIG:Topical   Yes [provider]  aspirin  81 MG tablet Take 81 mg by mouth daily.   Yes [provider]  Baclofen  5 MG TABS Take 2 tablets by mouth at bedtime.   Yes [provider]  BD PEN NEEDLE NANO 2ND GEN 32G X 4 MM MISC USE 1 NIGHTLY 09/28/23  Yes [provider]  Bismuth  Subsalicylate 525 MG/15ML SUSP Take 15 mLs (525 mg total) by mouth every 4 (four) hours as needed for diarrhea or loose stools 04/22/24  Yes Lorita Rosa, MD  canagliflozin (INVOKANA) 300 MG TABS tablet Take 300 mg by mouth daily before breakfast.   Yes [provider]  citalopram  (CELEXA ) 20 MG  tablet Take 20 mg by mouth at bedtime.   Yes [provider]  Ferrous Sulfate  (SLOW FE) 142 (45 Fe) MG TBCR Take 1 tablet by mouth daily. 09/12/21  Yes [provider]  furosemide  (LASIX ) 20 MG tablet Take 1 tablet (20 mg total) by mouth 2 (two) times daily. 03/23/24  Yes Aisha Hove, MD  gabapentin  (NEURONTIN ) 300 MG capsule Take 300 mg by mouth 3 (three) times daily. 06/02/23 06/01/24 Yes [provider]  insulin  lispro (HUMALOG) 100 UNIT/ML injection Inject 8 Units into the skin 3 (three) times daily with meals.   Yes [provider]  Insulin  Pen Needle 32G X 6 MM MISC 1 Syringe by Does not apply route. Use with Victoza .   Yes [provider]  lactulose  (CHRONULAC ) 10 GM/15ML solution Take 45 mLs (30 g total) by mouth 2 (two) times daily as needed for mild constipation or moderate constipation (titrate for 2-3 stools/day). 03/23/24  Yes Aisha Hove, MD  LANTUS  SOLOSTAR 100 UNIT/ML Solostar Pen Inject 30 Units into the skin daily. Patient taking differently: Inject 60 Units into the skin at bedtime. 03/23/24 05/24/25 Yes Aisha Hove, MD  levETIRAcetam  (KEPPRA ) 500 MG tablet Take 750 mg by mouth 2 (two) times daily.   Yes [provider]  MAGNESIUM -OXIDE 400 (240 Mg) MG tablet Take 2 tablets by mouth 2 (two) times daily. 10/14/23  Yes [provider]  metFORMIN  (GLUCOPHAGE -XR) 500 MG  24 hr tablet Take 1,000 mg by mouth 2 (two) times daily.   Yes [provider]  midodrine  (PROAMATINE ) 10 MG tablet Take 1 tablet (10 mg total) by mouth 3 (three) times daily with meals. 03/23/24  Yes Aisha Hove, MD  mirtazapine  (REMERON ) 15 MG tablet Take 1 tablet (15 mg total) by mouth at bedtime. 04/08/24 05/08/24 Yes Hisada, Ivan Marion, MD  naltrexone (DEPADE) 50 MG tablet Take 50 mg by mouth at bedtime.   Yes [provider]  ondansetron  (ZOFRAN -ODT) 4 MG disintegrating tablet Dissolve 1 tablet (4 mg total) in the  mouth every twelve (12) hours as needed for nausea. 10/05/23 10/04/24 Yes [provider]  oxyCODONE  (OXY IR/ROXICODONE ) 5 MG immediate release tablet Take 1 tablet (5 mg total) by mouth every 6 (six) hours as needed for moderate pain (pain score 4-6). 04/22/24  Yes Lorita Rosa, MD  pantoprazole  (PROTONIX ) 40 MG tablet TAKE ONE TABLET BY MOUTH EVERY DAY 10/24/19  Yes McGowan, Cathleen Coach A, PA-C  promethazine (PHENERGAN) 12.5 MG tablet Take 12.5 mg by mouth every 4 (four) hours as needed for nausea or vomiting. 05/23/22  Yes [provider]  rosuvastatin  (CRESTOR ) 20 MG tablet Take 1 tablet (20 mg total) by mouth daily. Patient taking differently: Take 20 mg by mouth at bedtime. 02/09/24  Yes Zhang, Dekui, MD  scopolamine (TRANSDERM-SCOP) 1 MG/3DAYS Place 1 patch onto the skin every 3 (three) days. 04/11/24 04/11/25 Yes [provider]  Sharps Container (BD SHARPS COLLECTOR) MISC Use as directed to dispose of Cosentyx pens. 10/05/23  Yes [provider]  spironolactone  (ALDACTONE ) 50 MG tablet Take 1 tablet (50 mg total) by mouth 2 (two) times daily. 03/23/24 06/21/24 Yes Aisha Hove, MD  thiamine  (VITAMIN B-1) 100 MG tablet Take 1 tablet (100 mg total) by mouth daily. 03/23/24  Yes Aisha Hove, MD  XIFAXAN  550 MG TABS tablet Take 550 mg by mouth 2 (two) times daily.   Yes [provider]  Zinc  Sulfate 220 (50 Zn) MG TABS Take 1 tablet (220 mg total) by mouth daily. 03/23/24  Yes Aisha Hove, MD  buPROPion  (WELLBUTRIN  XL) 150 MG 24 hr tablet Take 1 tablet (150 mg total) by mouth daily. Patient taking differently: Take 150 mg by mouth every evening. 11/16/23 04/19/24  Todd Fossa, MD  Continuous Glucose Sensor (FREESTYLE LIBRE 3 SENSOR) MISC by Does not apply route daily.    [provider]  COSENTYX SENSOREADY, 300 MG, 150 MG/ML SOAJ Inject into the skin.    [provider]    Allergies as of 04/06/2024   (No Known  Allergies)    Family History  Problem Relation Age of Onset   Diabetes Mother    Hypertension Mother    Depression Mother    Obesity Mother    Vision loss Mother    Alcohol  abuse Cousin    Bipolar disorder Cousin    Heart disease Maternal Grandfather    Alcohol  abuse Maternal Grandmother    Stroke Paternal Grandfather     Social History   Socioeconomic History   Marital status: Widowed    Spouse name: Not on file   Number of children: Not on file   Years of education: Not on file   Highest education level: Associate degree: academic program  Occupational History   Not on file  Tobacco Use   Smoking status: Never   Smokeless tobacco: Never  Vaping Use   Vaping status: Never Used  Substance and Sexual Activity  Alcohol  use: Not Currently    Comment:  Rarely. One or two drinks a year    Drug use: No   Sexual activity: Not Currently    Birth control/protection: Abstinence  Other Topics Concern   Not on file  Social History Narrative   PT is not working right now. Pt worries about financial needs since her husband died in May 17, 2024 and she has no income. She applied for Widow's benefits in July but has not heard any information and the application is still pending. If possible please inquire about application status.     Social Drivers of Corporate investment banker Strain: Low Risk  (12/16/2023)   Received from [facility]   Overall Financial Resource Strain (CARDIA)    Difficulty of Paying Living Expenses: Not very hard  Food Insecurity: Food Insecurity Present (04/19/2024)   Hunger Vital Sign    Worried About Running Out of Food in the Last Year: Never true    Ran Out of Food in the Last Year: Sometimes true  Transportation Needs: Unmet Transportation Needs (04/19/2024)   PRAPARE - Administrator, Civil Service (Medical): Yes    Lack of Transportation (Non-Medical): Yes  Physical Activity: Insufficiently Active (01/09/2021)   Received from [facility]   Exercise Vital Sign    Days of Exercise per Week: 1 day    Minutes of Exercise per Session: 40 min  Stress: Stress Concern Present (09/17/2022)   Received from [facility], [facility] of Occupational Health - Occupational Stress Questionnaire    Feeling of Stress : Very much  Social Connections: Socially Isolated (04/19/2024)   Social Connection and Isolation Panel [NHANES]    Frequency of Communication with Friends and Family: Once a week    Frequency of Social Gatherings with Friends and Family: Twice a week    Attends Religious Services: Never    Database administrator or Organizations: No    Attends Banker Meetings: Never    Marital Status: Widowed  Intimate Partner Violence: Patient Declined (04/19/2024)   Humiliation, Afraid, Rape, and Kick questionnaire    Fear of Current or Ex-Partner: Patient declined    Emotionally Abused: Patient declined    Physically Abused: Patient declined    Sexually Abused: Patient declined    Review of Systems: See HPI, otherwise negative ROS  Physical Exam: BP (!) 108/58   Pulse 95   Temp (!) 96.9 F (36.1 C) (Temporal)   Resp 18   Ht 5\' 4"  (1.626 m)   Wt 88.2 kg   LMP 10/15/2016   SpO2 98%   BMI 33.37 kg/m  General:   Alert,  pleasant and cooperative in NAD Head:  Normocephalic and atraumatic. Neck:  Supple; no masses or thyromegaly. Lungs:  Clear throughout to auscultation.    Heart:  Regular rate and rhythm. Abdomen:  Soft, nontender and nondistended. Normal bowel sounds, without guarding, and without rebound.   Neurologic:  Alert and  oriented x4;  grossly normal neurologically.  Impression/Plan: Abigail Molina is here for an endoscopy to be performed for esophageal varices  Risks, benefits, limitations, and alternatives regarding  endoscopy have been reviewed with the patient.  Questions have been answered.  All parties agreeable.   [name], MD  04/28/2024,  8:50 AM

## 2024-04-28 NOTE — Transfer of Care (Signed)
 Immediate Anesthesia Transfer of Care Note  Patient: Abigail Molina  Procedure(s) Performed: EGD (ESOPHAGOGASTRODUODENOSCOPY) ESOPHAGOSCOPY, WITH ESOPHAGEAL VARICES BAND LIGATION  Patient Location: Endoscopy Unit  Anesthesia Type:General  Level of Consciousness: awake, drowsy, and patient cooperative  Airway & Oxygen Therapy: Patient Spontanous Breathing and Patient connected to face mask oxygen  Post-op Assessment: Report given to RN and Post -op Vital signs reviewed and stable  Post vital signs: Reviewed and stable  Last Vitals:  Vitals Value Taken Time  BP 142/77 04/28/24 0912  Temp    Pulse 116 04/28/24 0913  Resp 22 04/28/24 0913  SpO2 100 % 04/28/24 0913  Vitals shown include unfiled device data.  Last Pain:  Vitals:   04/28/24 0839  TempSrc: Temporal  PainSc: 0-No pain         Complications: No notable events documented.

## 2024-04-28 NOTE — Anesthesia Preprocedure Evaluation (Signed)
 Anesthesia Evaluation  Patient identified by MRN, date of birth, ID band Patient awake    Reviewed: Allergy & Precautions, NPO status , Patient's Chart, lab work & pertinent test results  History of Anesthesia Complications Negative for: history of anesthetic complications  Airway Mallampati: IV   Neck ROM: Full    Dental  (+) Missing   Pulmonary sleep apnea    Pulmonary exam normal breath sounds clear to auscultation       Cardiovascular hypertension, + CAD (s/p MI)  Normal cardiovascular exam Rhythm:Regular Rate:Normal  ECG 04/18/24:  Sinus rhythm with Premature supraventricular complexes Left posterior fascicular block Inferior infarct , age undetermined Cannot rule out Anterior infarct , age undetermined  Echo 09/24/22:    1. Technically difficult study.    2. The left ventricle is normal in size with upper normal wall thickness.    3. The left ventricular systolic function is normal, LVEF is visually estimated at > 55%.    4. The left atrium is mildly to moderately dilated in size.    5. The right ventricle is normal in size, with normal systolic function.   Neuro/Psych  Headaches, Seizures - (last sz few weeks ago),  PSYCHIATRIC DISORDERS Anxiety Depression    Chronic pain  Neuromuscular disease (diabetic polyneuropathy)    GI/Hepatic ,GERD  ,,(+) Cirrhosis   Esophageal Varices and ascites    NAFLD; NASH   Endo/Other  diabetes, Type 2  Obesity   Renal/GU Renal disease (AKI)     Musculoskeletal  (+) Arthritis ,    Abdominal   Peds  Hematology   Anesthesia Other Findings   Reproductive/Obstetrics Endometrial CA                              Anesthesia Physical Anesthesia Plan  ASA: 4  Anesthesia Plan: General   Post-op Pain Management:    Induction: Intravenous  PONV Risk Score and Plan: 3 and Propofol  infusion, TIVA and Treatment may vary due to age or medical  condition  Airway Management Planned: Natural Airway  Additional Equipment:   Intra-op Plan:   Post-operative Plan:   Informed Consent: I have reviewed the patients History and Physical, chart, labs and discussed the procedure including the risks, benefits and alternatives for the proposed anesthesia with the patient or authorized representative who has indicated his/her understanding and acceptance.       Plan Discussed with: CRNA  Anesthesia Plan Comments: (LMA/GETA backup discussed.  Patient consented for risks of anesthesia including but not limited to:  - adverse reactions to medications - damage to eyes, teeth, lips or other oral mucosa - nerve damage due to positioning  - sore throat or hoarseness - damage to heart, brain, nerves, lungs, other parts of body or loss of life  Informed patient about role of CRNA in peri- and intra-operative care.  Patient voiced understanding.)         Anesthesia Quick Evaluation

## 2024-04-28 NOTE — Anesthesia Procedure Notes (Signed)
 Procedure Name: General with mask airway Date/Time: 04/28/2024 9:00 AM  Performed by: Niki Barter, CRNAPre-anesthesia Checklist: Patient identified, Emergency Drugs available, Suction available and Patient being monitored Patient Re-evaluated:Patient Re-evaluated prior to induction Oxygen Delivery Method: Simple face mask Induction Type: IV induction Placement Confirmation: positive ETCO2 and breath sounds checked- equal and bilateral Dental Injury: Teeth and Oropharynx as per pre-operative assessment

## 2024-04-29 ENCOUNTER — Encounter: Payer: Self-pay | Admitting: Gastroenterology

## 2024-05-02 ENCOUNTER — Other Ambulatory Visit: Payer: Self-pay | Admitting: Internal Medicine

## 2024-05-02 DIAGNOSIS — E782 Mixed hyperlipidemia: Secondary | ICD-10-CM

## 2024-05-02 DIAGNOSIS — E119 Type 2 diabetes mellitus without complications: Secondary | ICD-10-CM

## 2024-05-02 DIAGNOSIS — I10 Essential (primary) hypertension: Secondary | ICD-10-CM

## 2024-05-02 NOTE — Telephone Encounter (Signed)
 Could you contact her to arrange a sooner visit? In-person is preferred, but a video visit is fine if that's more feasible.

## 2024-05-03 ENCOUNTER — Inpatient Hospital Stay
Admission: EM | Admit: 2024-05-03 | Discharge: 2024-05-04 | DRG: 432 | Disposition: A | Discharge status: Other Acute Inpatient Hospital | Attending: Internal Medicine | Admitting: Internal Medicine

## 2024-05-03 ENCOUNTER — Inpatient Hospital Stay: Admitting: Anesthesiology

## 2024-05-03 ENCOUNTER — Inpatient Hospital Stay: Admitting: Radiology

## 2024-05-03 ENCOUNTER — Other Ambulatory Visit: Payer: Self-pay

## 2024-05-03 ENCOUNTER — Ambulatory Visit
Admission: EM | Admit: 2024-05-03 | Discharge: 2024-05-03 | Disposition: A | Discharge status: Home / Self Care | Source: Ambulatory Visit | Attending: Interventional Radiology | Admitting: Interventional Radiology

## 2024-05-03 ENCOUNTER — Encounter
Admission: EM | Disposition: A | Discharge status: Other Acute Inpatient Hospital | Payer: Self-pay | Source: Home / Self Care | Attending: Internal Medicine

## 2024-05-03 ENCOUNTER — Inpatient Hospital Stay: Admit: 2024-05-03 | Admitting: Critical Care Medicine

## 2024-05-03 ENCOUNTER — Encounter: Payer: Self-pay | Admitting: Emergency Medicine

## 2024-05-03 ENCOUNTER — Encounter (HOSPITAL_COMMUNITY): Payer: Self-pay

## 2024-05-03 DIAGNOSIS — I2489 Other forms of acute ischemic heart disease: Secondary | ICD-10-CM | POA: Diagnosis present

## 2024-05-03 DIAGNOSIS — I469 Cardiac arrest, cause unspecified: Secondary | ICD-10-CM | POA: Diagnosis not present

## 2024-05-03 DIAGNOSIS — Z5982 Transportation insecurity: Secondary | ICD-10-CM

## 2024-05-03 DIAGNOSIS — R188 Other ascites: Secondary | ICD-10-CM | POA: Diagnosis present

## 2024-05-03 DIAGNOSIS — K7460 Unspecified cirrhosis of liver: Principal | ICD-10-CM | POA: Diagnosis present

## 2024-05-03 DIAGNOSIS — R000 Tachycardia, unspecified: Secondary | ICD-10-CM | POA: Diagnosis present

## 2024-05-03 DIAGNOSIS — N179 Acute kidney failure, unspecified: Secondary | ICD-10-CM | POA: Diagnosis present

## 2024-05-03 DIAGNOSIS — F32A Depression, unspecified: Secondary | ICD-10-CM | POA: Diagnosis present

## 2024-05-03 DIAGNOSIS — Z79899 Other long term (current) drug therapy: Secondary | ICD-10-CM

## 2024-05-03 DIAGNOSIS — K920 Hematemesis: Secondary | ICD-10-CM | POA: Diagnosis not present

## 2024-05-03 DIAGNOSIS — Z7982 Long term (current) use of aspirin: Secondary | ICD-10-CM

## 2024-05-03 DIAGNOSIS — I10 Essential (primary) hypertension: Secondary | ICD-10-CM | POA: Diagnosis present

## 2024-05-03 DIAGNOSIS — K2211 Ulcer of esophagus with bleeding: Secondary | ICD-10-CM | POA: Diagnosis present

## 2024-05-03 DIAGNOSIS — I8511 Secondary esophageal varices with bleeding: Secondary | ICD-10-CM | POA: Diagnosis present

## 2024-05-03 DIAGNOSIS — K7581 Nonalcoholic steatohepatitis (NASH): Secondary | ICD-10-CM | POA: Diagnosis present

## 2024-05-03 DIAGNOSIS — E872 Acidosis, unspecified: Secondary | ICD-10-CM | POA: Diagnosis present

## 2024-05-03 DIAGNOSIS — E119 Type 2 diabetes mellitus without complications: Secondary | ICD-10-CM

## 2024-05-03 DIAGNOSIS — Z90710 Acquired absence of both cervix and uterus: Secondary | ICD-10-CM

## 2024-05-03 DIAGNOSIS — E875 Hyperkalemia: Secondary | ICD-10-CM | POA: Diagnosis present

## 2024-05-03 DIAGNOSIS — E1165 Type 2 diabetes mellitus with hyperglycemia: Secondary | ICD-10-CM | POA: Diagnosis present

## 2024-05-03 DIAGNOSIS — D509 Iron deficiency anemia, unspecified: Secondary | ICD-10-CM

## 2024-05-03 DIAGNOSIS — G40909 Epilepsy, unspecified, not intractable, without status epilepticus: Secondary | ICD-10-CM | POA: Diagnosis present

## 2024-05-03 DIAGNOSIS — K766 Portal hypertension: Secondary | ICD-10-CM | POA: Diagnosis present

## 2024-05-03 DIAGNOSIS — Z7984 Long term (current) use of oral hypoglycemic drugs: Secondary | ICD-10-CM

## 2024-05-03 DIAGNOSIS — F419 Anxiety disorder, unspecified: Secondary | ICD-10-CM | POA: Diagnosis present

## 2024-05-03 DIAGNOSIS — Z604 Social exclusion and rejection: Secondary | ICD-10-CM | POA: Diagnosis present

## 2024-05-03 DIAGNOSIS — K219 Gastro-esophageal reflux disease without esophagitis: Secondary | ICD-10-CM | POA: Diagnosis present

## 2024-05-03 DIAGNOSIS — E785 Hyperlipidemia, unspecified: Secondary | ICD-10-CM | POA: Diagnosis present

## 2024-05-03 DIAGNOSIS — I2510 Atherosclerotic heart disease of native coronary artery without angina pectoris: Secondary | ICD-10-CM | POA: Diagnosis present

## 2024-05-03 DIAGNOSIS — Z9049 Acquired absence of other specified parts of digestive tract: Secondary | ICD-10-CM

## 2024-05-03 DIAGNOSIS — Z794 Long term (current) use of insulin: Secondary | ICD-10-CM | POA: Diagnosis not present

## 2024-05-03 DIAGNOSIS — Z821 Family history of blindness and visual loss: Secondary | ICD-10-CM

## 2024-05-03 DIAGNOSIS — E871 Hypo-osmolality and hyponatremia: Secondary | ICD-10-CM | POA: Diagnosis present

## 2024-05-03 DIAGNOSIS — I252 Old myocardial infarction: Secondary | ICD-10-CM

## 2024-05-03 DIAGNOSIS — G8929 Other chronic pain: Secondary | ICD-10-CM | POA: Diagnosis present

## 2024-05-03 DIAGNOSIS — Z8542 Personal history of malignant neoplasm of other parts of uterus: Secondary | ICD-10-CM

## 2024-05-03 DIAGNOSIS — J9601 Acute respiratory failure with hypoxia: Secondary | ICD-10-CM | POA: Diagnosis not present

## 2024-05-03 DIAGNOSIS — R578 Other shock: Secondary | ICD-10-CM | POA: Diagnosis present

## 2024-05-03 DIAGNOSIS — R7989 Other specified abnormal findings of blood chemistry: Secondary | ICD-10-CM | POA: Diagnosis not present

## 2024-05-03 DIAGNOSIS — K922 Gastrointestinal hemorrhage, unspecified: Secondary | ICD-10-CM

## 2024-05-03 DIAGNOSIS — Z8249 Family history of ischemic heart disease and other diseases of the circulatory system: Secondary | ICD-10-CM

## 2024-05-03 DIAGNOSIS — J9602 Acute respiratory failure with hypercapnia: Secondary | ICD-10-CM | POA: Diagnosis not present

## 2024-05-03 DIAGNOSIS — R791 Abnormal coagulation profile: Secondary | ICD-10-CM | POA: Diagnosis present

## 2024-05-03 DIAGNOSIS — D72829 Elevated white blood cell count, unspecified: Secondary | ICD-10-CM | POA: Diagnosis present

## 2024-05-03 DIAGNOSIS — M25511 Pain in right shoulder: Secondary | ICD-10-CM | POA: Diagnosis present

## 2024-05-03 DIAGNOSIS — G9340 Encephalopathy, unspecified: Secondary | ICD-10-CM | POA: Diagnosis present

## 2024-05-03 DIAGNOSIS — R579 Shock, unspecified: Secondary | ICD-10-CM | POA: Diagnosis present

## 2024-05-03 DIAGNOSIS — D62 Acute posthemorrhagic anemia: Secondary | ICD-10-CM | POA: Diagnosis present

## 2024-05-03 DIAGNOSIS — G4733 Obstructive sleep apnea (adult) (pediatric): Secondary | ICD-10-CM | POA: Diagnosis present

## 2024-05-03 DIAGNOSIS — E878 Other disorders of electrolyte and fluid balance, not elsewhere classified: Secondary | ICD-10-CM | POA: Diagnosis present

## 2024-05-03 DIAGNOSIS — Z823 Family history of stroke: Secondary | ICD-10-CM

## 2024-05-03 DIAGNOSIS — Z818 Family history of other mental and behavioral disorders: Secondary | ICD-10-CM

## 2024-05-03 DIAGNOSIS — Z5941 Food insecurity: Secondary | ICD-10-CM

## 2024-05-03 DIAGNOSIS — Z833 Family history of diabetes mellitus: Secondary | ICD-10-CM

## 2024-05-03 HISTORY — PX: ESOPHAGOGASTRODUODENOSCOPY: SHX5428

## 2024-05-03 LAB — CBC
HCT: 20.2 % — ABNORMAL LOW (ref 36.0–46.0)
Hemoglobin: 6.1 g/dL — ABNORMAL LOW (ref 12.0–15.0)
MCH: 23.7 pg — ABNORMAL LOW (ref 26.0–34.0)
MCHC: 30.2 g/dL (ref 30.0–36.0)
MCV: 78.6 fL — ABNORMAL LOW (ref 80.0–100.0)
Platelets: 297 10*3/uL (ref 150–400)
RBC: 2.57 MIL/uL — ABNORMAL LOW (ref 3.87–5.11)
RDW: 21 % — ABNORMAL HIGH (ref 11.5–15.5)
WBC: 11.7 10*3/uL — ABNORMAL HIGH (ref 4.0–10.5)
nRBC: 0 % (ref 0.0–0.2)

## 2024-05-03 LAB — BASIC METABOLIC PANEL WITH GFR
Anion gap: 15 (ref 5–15)
BUN: 36 mg/dL — ABNORMAL HIGH (ref 6–20)
CO2: 17 mmol/L — ABNORMAL LOW (ref 22–32)
Calcium: 8.6 mg/dL — ABNORMAL LOW (ref 8.9–10.3)
Chloride: 101 mmol/L (ref 98–111)
Creatinine, Ser: 1.29 mg/dL — ABNORMAL HIGH (ref 0.44–1.00)
GFR, Estimated: 48 mL/min — ABNORMAL LOW (ref >60)
Glucose, Bld: 300 mg/dL — ABNORMAL HIGH (ref 70–99)
Potassium: 5.6 mmol/L — ABNORMAL HIGH (ref 3.5–5.1)
Sodium: 133 mmol/L — ABNORMAL LOW (ref 135–145)

## 2024-05-03 LAB — TROPONIN I (HIGH SENSITIVITY)
Troponin I (High Sensitivity): 7 ng/L (ref <18)
Troponin I (High Sensitivity): 23 ng/L — ABNORMAL HIGH (ref <18)
Troponin I (High Sensitivity): 137 ng/L (ref <18)

## 2024-05-03 LAB — CBC WITH DIFFERENTIAL/PLATELET
Abs Immature Granulocytes: 0.02 10*3/uL (ref 0.00–0.07)
Basophils Absolute: 0 10*3/uL (ref 0.0–0.1)
Basophils Relative: 1 %
Eosinophils Absolute: 0.1 10*3/uL (ref 0.0–0.5)
Eosinophils Relative: 2 %
HCT: 25.6 % — ABNORMAL LOW (ref 36.0–46.0)
Hemoglobin: 7.7 g/dL — ABNORMAL LOW (ref 12.0–15.0)
Immature Granulocytes: 0 %
Lymphocytes Relative: 14 %
Lymphs Abs: 0.7 10*3/uL (ref 0.7–4.0)
MCH: 22.6 pg — ABNORMAL LOW (ref 26.0–34.0)
MCHC: 30.1 g/dL (ref 30.0–36.0)
MCV: 75.1 fL — ABNORMAL LOW (ref 80.0–100.0)
Monocytes Absolute: 0.7 10*3/uL (ref 0.1–1.0)
Monocytes Relative: 13 %
Neutro Abs: 3.6 10*3/uL (ref 1.7–7.7)
Neutrophils Relative %: 70 %
Platelets: 209 10*3/uL (ref 150–400)
RBC: 3.41 MIL/uL — ABNORMAL LOW (ref 3.87–5.11)
RDW: 21.2 % — ABNORMAL HIGH (ref 11.5–15.5)
WBC: 5.1 10*3/uL (ref 4.0–10.5)
nRBC: 0 % (ref 0.0–0.2)

## 2024-05-03 LAB — BLOOD GAS, VENOUS
Acid-base deficit: 10.2 mmol/L — ABNORMAL HIGH (ref 0.0–2.0)
Bicarbonate: 14.6 mmol/L — ABNORMAL LOW (ref 20.0–28.0)
O2 Saturation: 75.5 %
Patient temperature: 37
Patient temperature: 37
pCO2, Ven: 29 mmHg — ABNORMAL LOW (ref 44–60)
pH, Ven: 7.31 (ref 7.25–7.43)
pO2, Ven: 47 mmHg — ABNORMAL HIGH (ref 32–45)

## 2024-05-03 LAB — HEMOGLOBIN AND HEMATOCRIT, BLOOD
HCT: 25.7 % — ABNORMAL LOW (ref 36.0–46.0)
Hemoglobin: 7.6 g/dL — ABNORMAL LOW (ref 12.0–15.0)

## 2024-05-03 LAB — COMPREHENSIVE METABOLIC PANEL WITH GFR
ALT: 23 U/L (ref 0–44)
AST: 57 U/L — ABNORMAL HIGH (ref 15–41)
Albumin: 2 g/dL — ABNORMAL LOW (ref 3.5–5.0)
Alkaline Phosphatase: 228 U/L — ABNORMAL HIGH (ref 38–126)
Anion gap: 14 (ref 5–15)
BUN: 21 mg/dL — ABNORMAL HIGH (ref 6–20)
CO2: 21 mmol/L — ABNORMAL LOW (ref 22–32)
Calcium: 8.5 mg/dL — ABNORMAL LOW (ref 8.9–10.3)
Chloride: 95 mmol/L — ABNORMAL LOW (ref 98–111)
Creatinine, Ser: 1.05 mg/dL — ABNORMAL HIGH (ref 0.44–1.00)
GFR, Estimated: >60 mL/min (ref >60)
Glucose, Bld: 353 mg/dL — ABNORMAL HIGH (ref 70–99)
Potassium: 4.8 mmol/L (ref 3.5–5.1)
Sodium: 130 mmol/L — ABNORMAL LOW (ref 135–145)
Total Bilirubin: 4.8 mg/dL — ABNORMAL HIGH (ref 0.0–1.2)
Total Protein: 6.7 g/dL (ref 6.5–8.1)

## 2024-05-03 LAB — PREPARE RBC (CROSSMATCH)

## 2024-05-03 LAB — GLUCOSE, CAPILLARY
Glucose-Capillary: 328 mg/dL — ABNORMAL HIGH (ref 70–99)
Glucose-Capillary: 244 mg/dL — ABNORMAL HIGH (ref 70–99)
Glucose-Capillary: 240 mg/dL — ABNORMAL HIGH (ref 70–99)
Glucose-Capillary: 275 mg/dL — ABNORMAL HIGH (ref 70–99)

## 2024-05-03 LAB — MRSA NEXT GEN BY PCR, NASAL: MRSA by PCR Next Gen: NOT DETECTED

## 2024-05-03 LAB — LACTIC ACID, PLASMA: Lactic Acid, Venous: >9 mmol/L (ref 0.5–1.9)

## 2024-05-03 LAB — PROTIME-INR
INR: 1.5 — ABNORMAL HIGH (ref 0.8–1.2)
Prothrombin Time: 18 s — ABNORMAL HIGH (ref 11.4–15.2)

## 2024-05-03 LAB — BETA-HYDROXYBUTYRIC ACID: Beta-Hydroxybutyric Acid: 0.47 mmol/L — ABNORMAL HIGH (ref 0.05–0.27)

## 2024-05-03 LAB — MAGNESIUM: Magnesium: 2 mg/dL (ref 1.7–2.4)

## 2024-05-03 LAB — LIPASE, BLOOD: Lipase: 91 U/L — ABNORMAL HIGH (ref 11–51)

## 2024-05-03 SURGERY — EGD (ESOPHAGOGASTRODUODENOSCOPY)
Anesthesia: General

## 2024-05-03 MED ORDER — ETOMIDATE 2 MG/ML IV SOLN
INTRAVENOUS | Status: AC
  Filled 2024-05-03: qty 10

## 2024-05-03 MED ORDER — INSULIN ASPART 100 UNIT/ML IJ SOLN
5.0000 [IU] | Freq: Once | INTRAMUSCULAR | Status: AC
  Administered 2024-05-03: 5 [IU] via SUBCUTANEOUS
  Filled 2024-05-03: qty 1

## 2024-05-03 MED ORDER — LEVETIRACETAM (KEPPRA) 500 MG/5 ML ADULT IV PUSH
750.0000 mg | Freq: Once | INTRAVENOUS | Status: DC
  Filled 2024-05-03: qty 7.5

## 2024-05-03 MED ORDER — SODIUM BICARBONATE 8.4 % IV SOLN
50.0000 meq | Freq: Once | INTRAVENOUS | Status: AC
  Administered 2024-05-03: 50 meq via INTRAVENOUS
  Filled 2024-05-03: qty 50

## 2024-05-03 MED ORDER — LIDOCAINE 5 % EX PTCH
1.0000 | MEDICATED_PATCH | CUTANEOUS | Status: DC
  Administered 2024-05-03: 1 via TRANSDERMAL
  Filled 2024-05-03: qty 1

## 2024-05-03 MED ORDER — ASPIRIN 81 MG PO TBEC: 81.0000 mg | DELAYED_RELEASE_TABLET | Freq: Every day | ORAL | Status: DC

## 2024-05-03 MED ORDER — SODIUM CHLORIDE 0.9% IV SOLUTION: Freq: Once | INTRAVENOUS | Status: AC

## 2024-05-03 MED ORDER — PHENYLEPHRINE HCL-NACL 20-0.9 MG/250ML-% IV SOLN
INTRAVENOUS | Status: AC
  Filled 2024-05-03: qty 250

## 2024-05-03 MED ORDER — NOREPINEPHRINE 16 MG/250ML-% IV SOLN
0.0000 ug/min | INTRAVENOUS | Status: DC
  Administered 2024-05-03: 25 ug/min via INTRAVENOUS
  Filled 2024-05-03: qty 250

## 2024-05-03 MED ORDER — RHO D IMMUNE GLOBULIN 1500 UNITS IM SOSY: 300.0000 ug | PREFILLED_SYRINGE | Freq: Once | INTRAMUSCULAR | Status: DC

## 2024-05-03 MED ORDER — INSULIN ASPART 100 UNIT/ML IV SOLN
10.0000 [IU] | Freq: Once | INTRAVENOUS | Status: AC
  Administered 2024-05-03: 10 [IU] via INTRAVENOUS
  Filled 2024-05-03: qty 0.1

## 2024-05-03 MED ORDER — BUPROPION HCL ER (XL) 150 MG PO TB24
150.0000 mg | ORAL_TABLET | Freq: Every evening | ORAL | Status: DC
  Administered 2024-05-03: 150 mg via ORAL
  Filled 2024-05-03: qty 1

## 2024-05-03 MED ORDER — SODIUM CHLORIDE 0.9 % IV SOLN
1.0000 g | Freq: Once | INTRAVENOUS | Status: AC
  Administered 2024-05-03: 1 g via INTRAVENOUS
  Filled 2024-05-03: qty 10

## 2024-05-03 MED ORDER — FENTANYL CITRATE PF 50 MCG/ML IJ SOSY
PREFILLED_SYRINGE | INTRAMUSCULAR | Status: AC
  Administered 2024-05-03: 50 ug via INTRAVENOUS
  Filled 2024-05-03: qty 1

## 2024-05-03 MED ORDER — DEXTROSE 50 % IV SOLN
1.0000 | Freq: Once | INTRAVENOUS | Status: AC
  Administered 2024-05-03: 50 mL via INTRAVENOUS
  Filled 2024-05-03: qty 50

## 2024-05-03 MED ORDER — GLYCOPYRROLATE 0.2 MG/ML IJ SOLN
INTRAMUSCULAR | Status: DC | PRN
Start: 2024-05-03 — End: 2024-05-03
  Administered 2024-05-03: .1 mg via INTRAVENOUS

## 2024-05-03 MED ORDER — SODIUM CHLORIDE 0.9 % IV BOLUS
500.0000 mL | Freq: Once | INTRAVENOUS | Status: AC
  Administered 2024-05-03: 500 mL via INTRAVENOUS

## 2024-05-03 MED ORDER — MIRTAZAPINE 15 MG PO TABS: 15.0000 mg | ORAL_TABLET | Freq: Every day | ORAL | Status: DC

## 2024-05-03 MED ORDER — LEVETIRACETAM 750 MG PO TABS
750.0000 mg | ORAL_TABLET | Freq: Two times a day (BID) | ORAL | Status: DC
  Filled 2024-05-03 (×2): qty 1

## 2024-05-03 MED ORDER — LIDOCAINE HCL 1 % IJ SOLN
INTRAMUSCULAR | Status: AC
  Filled 2024-05-03: qty 20

## 2024-05-03 MED ORDER — ESMOLOL HCL 100 MG/10ML IV SOLN
INTRAVENOUS | Status: DC | PRN
  Administered 2024-05-03 (×2): 10 mg via INTRAVENOUS

## 2024-05-03 MED ORDER — LACTULOSE 10 GM/15ML PO SOLN
30.0000 g | Freq: Two times a day (BID) | ORAL | Status: DC | PRN
Start: 2024-05-03 — End: 2024-05-03

## 2024-05-03 MED ORDER — LACTATED RINGERS IV BOLUS
500.0000 mL | Freq: Once | INTRAVENOUS | Status: AC
  Administered 2024-05-03: 500 mL via INTRAVENOUS

## 2024-05-03 MED ORDER — ALBUMIN HUMAN 25 % IV SOLN
25.0000 g | Freq: Once | INTRAVENOUS | Status: AC
  Administered 2024-05-03: 25 g via INTRAVENOUS
  Filled 2024-05-03: qty 100

## 2024-05-03 MED ORDER — LEVETIRACETAM (KEPPRA) 500 MG/5 ML ADULT IV PUSH
750.0000 mg | Freq: Once | INTRAVENOUS | Status: AC
  Administered 2024-05-03: 750 mg via INTRAVENOUS
  Filled 2024-05-03: qty 7.5

## 2024-05-03 MED ORDER — PROPOFOL 10 MG/ML IV BOLUS
INTRAVENOUS | Status: DC | PRN
Start: 2024-05-03 — End: 2024-05-03
  Administered 2024-05-03: 120 mg via INTRAVENOUS

## 2024-05-03 MED ORDER — OCTREOTIDE LOAD VIA INFUSION
50.0000 ug | Freq: Once | INTRAVENOUS | Status: AC
  Administered 2024-05-03: 50 ug via INTRAVENOUS
  Filled 2024-05-03: qty 25

## 2024-05-03 MED ORDER — RIFAXIMIN 550 MG PO TABS: 550.0000 mg | ORAL_TABLET | Freq: Two times a day (BID) | ORAL | Status: DC

## 2024-05-03 MED ORDER — SODIUM CHLORIDE 0.9 % IV SOLN: 250.0000 mL | INTRAVENOUS | Status: DC

## 2024-05-03 MED ORDER — SODIUM CHLORIDE 0.9% IV SOLUTION: Freq: Once | INTRAVENOUS | Status: DC

## 2024-05-03 MED ORDER — PANTOPRAZOLE SODIUM 40 MG IV SOLR
80.0000 mg | Freq: Once | INTRAVENOUS | Status: AC
  Administered 2024-05-03: 80 mg via INTRAVENOUS
  Filled 2024-05-03: qty 20

## 2024-05-03 MED ORDER — ALBUMIN HUMAN 5 % IV SOLN
INTRAVENOUS | Status: AC
  Filled 2024-05-03: qty 250

## 2024-05-03 MED ORDER — LIDOCAINE HCL (PF) 2 % IJ SOLN
INTRAMUSCULAR | Status: AC
  Filled 2024-05-03: qty 5

## 2024-05-03 MED ORDER — BACLOFEN 10 MG PO TABS
10.0000 mg | ORAL_TABLET | Freq: Every day | ORAL | Status: DC
  Filled 2024-05-03: qty 1

## 2024-05-03 MED ORDER — PROPOFOL 10 MG/ML IV BOLUS
INTRAVENOUS | Status: AC
  Filled 2024-05-03: qty 20

## 2024-05-03 MED ORDER — INSULIN ASPART 100 UNIT/ML IJ SOLN
0.0000 [IU] | Freq: Three times a day (TID) | INTRAMUSCULAR | Status: DC
  Administered 2024-05-03: 11 [IU] via SUBCUTANEOUS
  Administered 2024-05-03: 5 [IU] via SUBCUTANEOUS
  Filled 2024-05-03 (×2): qty 1

## 2024-05-03 MED ORDER — INSULIN ASPART 100 UNIT/ML IJ SOLN: 0.0000 [IU] | INTRAMUSCULAR | Status: DC

## 2024-05-03 MED ORDER — MIDODRINE HCL 5 MG PO TABS
10.0000 mg | ORAL_TABLET | Freq: Three times a day (TID) | ORAL | Status: DC
  Administered 2024-05-03 (×2): 10 mg via ORAL
  Filled 2024-05-03 (×2): qty 2

## 2024-05-03 MED ORDER — VASOPRESSIN 20 UNITS/100 ML INFUSION FOR SHOCK
0.0000 [IU]/min | INTRAVENOUS | Status: DC
  Administered 2024-05-03: 0.04 [IU]/min via INTRAVENOUS
  Filled 2024-05-03 (×2): qty 100

## 2024-05-03 MED ORDER — SUCCINYLCHOLINE CHLORIDE 200 MG/10ML IV SOSY
PREFILLED_SYRINGE | INTRAVENOUS | Status: DC | PRN
  Administered 2024-05-03: 100 mg via INTRAVENOUS

## 2024-05-03 MED ORDER — INSULIN GLARGINE 100 UNIT/ML ~~LOC~~ SOLN
10.0000 [IU] | Freq: Every day | SUBCUTANEOUS | Status: DC
  Filled 2024-05-03 (×2): qty 0.1

## 2024-05-03 MED ORDER — ALBUMIN HUMAN 5 % IV SOLN
INTRAVENOUS | Status: DC | PRN
Start: 2024-05-03 — End: 2024-05-03

## 2024-05-03 MED ORDER — SODIUM CHLORIDE 0.9% IV SOLUTION
Freq: Once | INTRAVENOUS | Status: DC
  Filled 2024-05-03: qty 250

## 2024-05-03 MED ORDER — FENTANYL CITRATE (PF) 100 MCG/2ML IJ SOLN
INTRAMUSCULAR | Status: AC
  Filled 2024-05-03: qty 2

## 2024-05-03 MED ORDER — LIDOCAINE HCL (CARDIAC) PF 100 MG/5ML IV SOSY
PREFILLED_SYRINGE | INTRAVENOUS | Status: DC | PRN
  Administered 2024-05-03: 100 mg via INTRAVENOUS

## 2024-05-03 MED ORDER — SUGAMMADEX SODIUM 200 MG/2ML IV SOLN
INTRAVENOUS | Status: DC | PRN
  Administered 2024-05-03 (×2): 100 mg via INTRAVENOUS

## 2024-05-03 MED ORDER — MIDAZOLAM HCL 2 MG/2ML IJ SOLN
INTRAMUSCULAR | Status: DC | PRN
  Administered 2024-05-03: 2 mg via INTRAVENOUS

## 2024-05-03 MED ORDER — DIAZEPAM 5 MG/ML IJ SOLN: 2.5000 mg | Freq: Four times a day (QID) | INTRAMUSCULAR | Status: DC | PRN

## 2024-05-03 MED ORDER — NOREPINEPHRINE 4 MG/250ML-% IV SOLN
INTRAVENOUS | Status: AC
  Filled 2024-05-03: qty 250

## 2024-05-03 MED ORDER — SODIUM CHLORIDE 0.9 % IV SOLN: 2.0000 g | INTRAVENOUS | Status: DC

## 2024-05-03 MED ORDER — ALBUMIN HUMAN 5 % IV SOLN
INTRAVENOUS | Status: AC
  Filled 2024-05-03: qty 1250

## 2024-05-03 MED ORDER — MIDAZOLAM HCL 2 MG/2ML IJ SOLN
INTRAMUSCULAR | Status: AC
  Filled 2024-05-03: qty 2

## 2024-05-03 MED ORDER — DIAZEPAM 5 MG/ML IJ SOLN
5.0000 mg | Freq: Four times a day (QID) | INTRAMUSCULAR | Status: DC | PRN
  Administered 2024-05-03: 5 mg via INTRAVENOUS
  Filled 2024-05-03: qty 2

## 2024-05-03 MED ORDER — PHENYLEPHRINE 80 MCG/ML (10ML) SYRINGE FOR IV PUSH (FOR BLOOD PRESSURE SUPPORT)
PREFILLED_SYRINGE | INTRAVENOUS | Status: DC | PRN
  Administered 2024-05-03: 80 ug via INTRAVENOUS
  Administered 2024-05-03: 160 ug via INTRAVENOUS

## 2024-05-03 MED ORDER — METOCLOPRAMIDE HCL 5 MG/ML IJ SOLN
INTRAMUSCULAR | Status: AC
  Filled 2024-05-03: qty 2

## 2024-05-03 MED ORDER — CITALOPRAM HYDROBROMIDE 20 MG PO TABS: 20.0000 mg | ORAL_TABLET | Freq: Every day | ORAL | Status: DC

## 2024-05-03 MED ORDER — NOREPINEPHRINE 4 MG/250ML-% IV SOLN
0.0000 ug/min | INTRAVENOUS | Status: DC
  Administered 2024-05-03: 5 ug/min via INTRAVENOUS

## 2024-05-03 MED ORDER — GABAPENTIN 300 MG PO CAPS: 300.0000 mg | ORAL_CAPSULE | Freq: Three times a day (TID) | ORAL | Status: DC

## 2024-05-03 MED ORDER — ROCURONIUM BROMIDE 10 MG/ML (PF) SYRINGE
PREFILLED_SYRINGE | INTRAVENOUS | Status: AC
Start: 2024-05-03 — End: ?
  Filled 2024-05-03: qty 10

## 2024-05-03 MED ORDER — ONDANSETRON HCL 4 MG/2ML IJ SOLN
4.0000 mg | Freq: Four times a day (QID) | INTRAMUSCULAR | Status: DC | PRN
  Administered 2024-05-03 (×2): 4 mg via INTRAVENOUS
  Filled 2024-05-03: qty 2

## 2024-05-03 MED ORDER — LEVETIRACETAM (KEPPRA) 500 MG/5 ML ADULT IV PUSH
750.0000 mg | Freq: Two times a day (BID) | INTRAVENOUS | Status: DC
  Administered 2024-05-03: 750 mg via INTRAVENOUS
  Filled 2024-05-03 (×2): qty 7.5

## 2024-05-03 MED ORDER — PANTOPRAZOLE SODIUM 40 MG PO TBEC
40.0000 mg | DELAYED_RELEASE_TABLET | Freq: Every day | ORAL | Status: DC
  Filled 2024-05-03: qty 1

## 2024-05-03 MED ORDER — VITAMIN K1 10 MG/ML IJ SOLN
5.0000 mg | Freq: Once | INTRAMUSCULAR | Status: AC
  Administered 2024-05-03: 5 mg via SUBCUTANEOUS
  Filled 2024-05-03: qty 1

## 2024-05-03 MED ORDER — ROCURONIUM BROMIDE 100 MG/10ML IV SOLN
INTRAVENOUS | Status: DC | PRN
  Administered 2024-05-03: 20 mg via INTRAVENOUS

## 2024-05-03 MED ORDER — PANTOPRAZOLE SODIUM 40 MG IV SOLR
40.0000 mg | Freq: Two times a day (BID) | INTRAVENOUS | Status: DC
  Administered 2024-05-03: 40 mg via INTRAVENOUS
  Filled 2024-05-03: qty 10

## 2024-05-03 MED ORDER — PHENYLEPHRINE 80 MCG/ML (10ML) SYRINGE FOR IV PUSH (FOR BLOOD PRESSURE SUPPORT)
PREFILLED_SYRINGE | INTRAVENOUS | Status: AC
  Filled 2024-05-03: qty 10

## 2024-05-03 MED ORDER — NALTREXONE HCL 50 MG PO TABS
50.0000 mg | ORAL_TABLET | Freq: Every day | ORAL | Status: DC
  Filled 2024-05-03: qty 1

## 2024-05-03 MED ORDER — SODIUM CHLORIDE 0.9% IV SOLUTION
Freq: Once | INTRAVENOUS | Status: DC
Start: 2024-05-03 — End: 2024-05-04

## 2024-05-03 MED ORDER — ONDANSETRON HCL 4 MG/2ML IJ SOLN
INTRAMUSCULAR | Status: AC
Start: 2024-05-03 — End: ?
  Filled 2024-05-03: qty 2

## 2024-05-03 MED ORDER — INSULIN ASPART 100 UNIT/ML IJ SOLN: 0.0000 [IU] | Freq: Every day | INTRAMUSCULAR | Status: DC

## 2024-05-03 MED ORDER — METOCLOPRAMIDE HCL 5 MG/ML IJ SOLN
10.0000 mg | Freq: Once | INTRAMUSCULAR | Status: AC
  Administered 2024-05-03: 10 mg via INTRAVENOUS

## 2024-05-03 MED ORDER — CHLORHEXIDINE GLUCONATE CLOTH 2 % EX PADS
6.0000 | MEDICATED_PAD | Freq: Every day | CUTANEOUS | Status: DC
  Administered 2024-05-03: 6 via TOPICAL

## 2024-05-03 MED ORDER — PROCHLORPERAZINE EDISYLATE 10 MG/2ML IJ SOLN
10.0000 mg | Freq: Four times a day (QID) | INTRAMUSCULAR | Status: DC | PRN
  Administered 2024-05-03: 10 mg via INTRAVENOUS
  Filled 2024-05-03: qty 2

## 2024-05-03 MED ORDER — FENTANYL CITRATE PF 50 MCG/ML IJ SOSY
50.0000 ug | PREFILLED_SYRINGE | Freq: Once | INTRAMUSCULAR | Status: AC
  Filled 2024-05-03: qty 1

## 2024-05-03 MED ORDER — SODIUM CHLORIDE 0.9 % IV SOLN
100.0000 ug/h | INTRAVENOUS | Status: DC
  Administered 2024-05-03 (×2): 50 ug/h via INTRAVENOUS
  Filled 2024-05-03 (×4): qty 1

## 2024-05-03 MED ORDER — ROSUVASTATIN CALCIUM 10 MG PO TABS: 20.0000 mg | ORAL_TABLET | Freq: Every day | ORAL | Status: DC

## 2024-05-03 MED ORDER — CALCIUM GLUCONATE-NACL 1-0.675 GM/50ML-% IV SOLN
1.0000 g | Freq: Once | INTRAVENOUS | Status: AC
  Administered 2024-05-03: 1000 mg via INTRAVENOUS
  Filled 2024-05-03: qty 50

## 2024-05-03 MED ORDER — FLUMAZENIL 0.5 MG/5ML IV SOLN
0.2000 mg | Freq: Once | INTRAVENOUS | Status: DC
  Filled 2024-05-03: qty 5

## 2024-05-03 MED ORDER — ONDANSETRON HCL 4 MG/2ML IJ SOLN
4.0000 mg | Freq: Once | INTRAMUSCULAR | Status: AC
  Administered 2024-05-03: 4 mg via INTRAVENOUS
  Filled 2024-05-03: qty 2

## 2024-05-03 MED ORDER — LACTATED RINGERS IV SOLN: INTRAVENOUS | Status: DC | PRN

## 2024-05-03 NOTE — Unmapped (Signed)
 Pt sent MyChart to report severe abdominal pain and black stool in the setting of recent hospital discharge following esophageal variceal bleed per pt report.  Advised emergent ED care and upon checking chart, pt is admitted to [facility] with hematemesis.

## 2024-05-03 NOTE — Anesthesia Preprocedure Evaluation (Signed)
 Anesthesia Evaluation  General Assessment Comment:  Patient with hematemesis, last a few hours ago  History of Anesthesia Complications Negative for: history of anesthetic complications  Airway Mallampati: IV   Neck ROM: Full    Dental  (+) Missing, Dental Advidsory Given   Pulmonary sleep apnea and Continuous Positive Airway Pressure Ventilation    Pulmonary exam normal breath sounds clear to auscultation       Cardiovascular hypertension, + angina (likely secondary to acute blood loss anemia)  + CAD (s/p MI) and + Past MI  Normal cardiovascular exam Rhythm:Regular Rate:Normal  ECG 05/03/24:  Sinus tachycardia Inferior infarct, old  Echo 01/26/24:  1. Left ventricular ejection fraction, by estimation, is >55%. The left ventricle has normal function. Left ventricular endocardial border not optimally defined to evaluate regional wall motion. Left ventricular diastolic parameters were normal.  2. Right ventricular systolic function is normal. The right ventricular size is normal. Tricuspid regurgitation signal is inadequate for assessing PA pressure.  3. The mitral valve is normal in structure. No evidence of mitral valve regurgitation. No evidence of mitral stenosis.  4. The aortic valve was not well visualized. Aortic valve regurgitation is not visualized. No aortic stenosis is present.    Neuro/Psych  Headaches, Seizures - (last sz few weeks ago),  PSYCHIATRIC DISORDERS Anxiety Depression    Chronic pain  Neuromuscular disease (diabetic polyneuropathy)    GI/Hepatic ,GERD  ,,(+) Cirrhosis   Esophageal Varices and ascites    NAFLD; NASH   Endo/Other  diabetes, Type 2  Obesity   Renal/GU Renal disease (AKI)     Musculoskeletal  (+) Arthritis ,    Abdominal   Peds  Hematology  (+) Blood dyscrasia, anemia   Anesthesia Other Findings Cardiology note 04/29/24:  Assessment & Plan Cirrhosis of liver Cirrhosis of the liver  with ongoing evaluation for liver transplant. Lasix  is effective at current low dose with no side effects. Blood pressure and heart rate are well-managed. Cardiac ultrasound from February was normal. - Continue current dose of Lasix   Bleeding esophageal varices Bleeding esophageal varices with preference for non-invasive procedures due to bleeding risk. Stress test preferred over catheterization to avoid heparin  use, given recent variceal bleeding. - Avoid invasive procedures that require heparin  - Proceed with stress test instead of catheterization  Septic shock Septic shock may have mimicked myocardial infarction. Stress test planned to evaluate cardiac function and potential blockages. - Order stress test to evaluate cardiac function and potential blockages  Return in about 3 months (around 07/30/2024).    Reproductive/Obstetrics Endometrial CA                             Anesthesia Physical Anesthesia Plan  ASA: 4 and emergent  Anesthesia Plan: General   Post-op Pain Management: Minimal or no pain anticipated   Induction: Intravenous, Rapid sequence and Cricoid pressure planned  PONV Risk Score and Plan: 3 and Treatment may vary due to age or medical condition, Ondansetron  and Dexamethasone  Airway Management Planned: Oral ETT and Video Laryngoscope Planned  Additional Equipment: None  Intra-op Plan:   Post-operative Plan: Post-operative intubation/ventilation  Informed Consent: I have reviewed the patients History and Physical, chart, labs and discussed the procedure including the risks, benefits and alternatives for the proposed anesthesia with the patient or authorized representative who has indicated his/her understanding and acceptance.     Dental advisory given  Plan Discussed with: CRNA and Surgeon  Anesthesia Plan Comments: (  Discussed risks of anesthesia with patient, including PONV, sore throat, lip/dental/eye damage, aspiration. Rare  risks discussed as well, such as cardiorespiratory and neurological sequelae, and allergic reactions. Discussed the role of CRNA in patient's perioperative care. Patient understands. Patient counseled on being higher risk for anesthesia due to comorbidities: cirrhosis, hematemesis. Patient was told about increased risk of cardiac and respiratory events, including death. Discussed possible prolonged intubation depending on how the procedure goes.)        Anesthesia Quick Evaluation

## 2024-05-03 NOTE — ED Provider Notes (Signed)
 [facility] Provider Note    Event Date/Time   First MD Initiated Contact with Patient 05/03/24 [phone]     (approximate)   History   Hypotension   HPI  Abigail Molina is a 58 y.o. female with history of cirrhosis complicated by ascites and esophageal varices, anxiety, depression, hypertension, OSA, diabetes, presenting with hematemesis.  Patient states that she woke up today, set up in bed, felt unwell and vomited bright red blood with some clots.  When the fire department got there, her blood pressure was reported to be in the 80s systolic.  Per EMS, they gave 500 cc of normal saline, repeat blood pressures were systolic 100s.  Did not give her any antiemetics.  Patient is not complaining about any abdominal pain, no hematochezia or melena.  No fever, chest pain, shortness of breath.  Her blood glucose was noted to be elevated to the 300s but patient states that this is typical for her recently.  Independent history obtained from EMS as above.  On independent chart review, was seen by GI on 20 May, had an endoscopy done showed grade 3 varices in the lower esophagus, status post banding.     Physical Exam   Triage Vital Signs: ED Triage Vitals  Encounter Vitals Group     BP      Systolic BP Percentile      Diastolic BP Percentile      Pulse      Resp      Temp      Temp src      SpO2      Weight      Height      Head Circumference      Peak Flow      Pain Score      Pain Loc      Pain Education      Exclude from Growth Chart     Most recent vital signs: Vitals:   05/03/24 0950  BP: (!) 96/54  Pulse: (!) 110  Resp: (!) 22  SpO2: 100%     General: Awake, no distress.  CV:  Good peripheral perfusion.  Resp:  Normal effort.  Clear Abd:  No distention.  Soft nontender Other:  Dried blood in her mouth.  Jaundiced   ED Results / Procedures / Treatments   Labs (all labs ordered are listed, but only abnormal results are displayed) Labs  Reviewed  COMPREHENSIVE METABOLIC PANEL WITH GFR - Abnormal; Notable for the following components:      Result Value   Sodium 130 (*)    Chloride 95 (*)    CO2 21 (*)    Glucose, Bld 353 (*)    BUN 21 (*)    Creatinine, Ser 1.05 (*)    Calcium  8.5 (*)    Albumin  2.0 (*)    AST 57 (*)    Alkaline Phosphatase 228 (*)    Total Bilirubin 4.8 (*)    All other components within normal limits  LIPASE, BLOOD - Abnormal; Notable for the following components:   Lipase 91 (*)    All other components within normal limits  CBC WITH DIFFERENTIAL/PLATELET - Abnormal; Notable for the following components:   RBC 3.41 (*)    Hemoglobin 7.7 (*)    HCT 25.6 (*)    MCV 75.1 (*)    MCH 22.6 (*)    RDW 21.2 (*)    All other components within normal limits  PROTIME-INR -  Abnormal; Notable for the following components:   Prothrombin Time 18.0 (*)    INR 1.5 (*)    All other components within normal limits  BLOOD GAS, VENOUS - Abnormal; Notable for the following components:   pCO2, Ven 35 (*)    Acid-base deficit 3.8 (*)    All other components within normal limits  LACTIC ACID, PLASMA  LACTIC ACID, PLASMA  BETA-HYDROXYBUTYRIC ACID  MAGNESIUM   HEMOGLOBIN AND HEMATOCRIT, BLOOD  HEMOGLOBIN AND HEMATOCRIT, BLOOD  TYPE AND SCREEN  TROPONIN I (HIGH SENSITIVITY)     EKG  EKG shows, sinus tachycardia, rate 110, normal QRS, prolonged QTc, no ischemic ST elevation, T wave inversion 2 3, T wave flattening to aVF, prolonged QTc is new compared to prior    PROCEDURES:  Critical Care performed: Yes, see critical care procedure note(s)  .Critical Care  Performed by: Shane Darling, MD Authorized by: Shane Darling, MD   Critical care provider statement:    Critical care time (minutes):  40   Critical care was necessary to treat or prevent imminent or life-threatening deterioration of the following conditions:  Circulatory failure   Critical care was time spent personally by me on the following  activities:  Development of treatment plan with patient or surrogate, discussions with consultants, evaluation of patient's response to treatment, examination of patient, ordering and review of laboratory studies, ordering and review of radiographic studies, ordering and performing treatments and interventions, pulse oximetry, re-evaluation of patient's condition and review of old charts    MEDICATIONS ORDERED IN ED: Medications  octreotide  (SANDOSTATIN ) 2 mcg/mL load via infusion 50 mcg (50 mcg Intravenous Bolus from Bag 05/03/24 1051)    And  octreotide  (SANDOSTATIN ) 500 mcg in sodium chloride  0.9 % 250 mL (2 mcg/mL) infusion (has no administration in time range)  pantoprazole  (PROTONIX ) injection 80 mg (80 mg Intravenous Given 05/03/24 1016)  cefTRIAXone  (ROCEPHIN ) 1 g in sodium chloride  0.9 % 100 mL IVPB (0 g Intravenous Stopped 05/03/24 1052)  sodium chloride  0.9 % bolus 500 mL (500 mLs Intravenous New Bag/Given 05/03/24 1038)  ondansetron  (ZOFRAN ) injection 4 mg (4 mg Intravenous Given 05/03/24 1051)     IMPRESSION / MDM / ASSESSMENT AND PLAN / ED COURSE  I reviewed the triage vital signs and the nursing notes.                              Differential diagnosis includes, but is not limited to, upper GI bleed, variceal bleed, gastritis, peptic ulcer disease, anemia.  For the elevated blood glucose, considered glycemia, DKA, electrolyte derangements.  Will get labs, type and screen, will give her IV Protonix , IV octreotide , IV ceftriaxone .  She will need to be admitted for GI evaluation.  Patient's presentation is most consistent with acute presentation with potential threat to life or bodily function.  Independent interpretation of labs and imaging below.  Clinical course as below, given that hematemesis and history of esophageal varices, she will need to be admitted for GI scope.  Consult the hospitalist was agreeable with the plan for admission and will evaluate the patient.  She is  admitted.  The patient is on the cardiac monitor to evaluate for evidence of arrhythmia and/or significant heart rate changes.   Clinical Course as of 05/03/24 1116  Tue May 03, 2024  1009 Consulted with Dr. Ole Berkeley from GI who will evaluate her, states likely will do endoscopy today. [TT]  1104 Independent  review of labs, VBG pH is normal, lipase is mildly elevated, she is hyponatremic, creatinine is mildly elevated, LFTs are mildly elevated.  Suspect elevated LFTs are secondary to cirrhosis, she states that she does not appear more jaundiced than usual, no abdominal pain at this time. [TT]  1105 CBC with Differential(!) No leukocytosis, hemoglobin is down by 1 compared to prior, platelets are normal. [TT]    Clinical Course User Index [TT] Drenda Gentle Richard Champion, MD     FINAL CLINICAL IMPRESSION(S) / ED DIAGNOSES   Final diagnoses:  Hematemesis, unspecified whether nausea present  Hepatic cirrhosis, unspecified hepatic cirrhosis type, unspecified whether ascites present (HCC)  Iron deficiency anemia, unspecified iron deficiency anemia type  Hyponatremia     Rx / DC Orders   ED Discharge Orders     None        Note:  This document was prepared using Dragon voice recognition software and may include unintentional dictation errors.    Shane Darling, MD 05/03/24 [phone]

## 2024-05-03 NOTE — Anesthesia Preprocedure Evaluation (Addendum)
 Anesthesia Evaluation  General Assessment Comment:  Patient with hematemesis, last a few hours ago  History of Anesthesia Complications Negative for: history of anesthetic complications  Airway Mallampati: IV   Neck ROM: Full    Dental  (+) Missing   Pulmonary sleep apnea and Continuous Positive Airway Pressure Ventilation    Pulmonary exam normal breath sounds clear to auscultation       Cardiovascular hypertension, + CAD (s/p MI) and + Past MI  Normal cardiovascular exam Rhythm:Regular Rate:Normal  ECG 05/03/24:  Sinus tachycardia Inferior infarct, old  Echo 01/26/24:  1. Left ventricular ejection fraction, by estimation, is >55%. The left ventricle has normal function. Left ventricular endocardial border not optimally defined to evaluate regional wall motion. Left ventricular diastolic parameters were normal.  2. Right ventricular systolic function is normal. The right ventricular size is normal. Tricuspid regurgitation signal is inadequate for assessing PA pressure.  3. The mitral valve is normal in structure. No evidence of mitral valve regurgitation. No evidence of mitral stenosis.  4. The aortic valve was not well visualized. Aortic valve regurgitation is not visualized. No aortic stenosis is present.    Neuro/Psych  Headaches, Seizures - (last sz few weeks ago),  PSYCHIATRIC DISORDERS Anxiety Depression    Chronic pain  Neuromuscular disease (diabetic polyneuropathy)    GI/Hepatic ,GERD  ,,(+) Cirrhosis   Esophageal Varices and ascites    NAFLD; NASH   Endo/Other  diabetes, Type 2  Obesity   Renal/GU Renal disease (AKI)     Musculoskeletal  (+) Arthritis ,    Abdominal   Peds  Hematology   Anesthesia Other Findings Cardiology note 04/29/24:  Assessment & Plan Cirrhosis of liver Cirrhosis of the liver with ongoing evaluation for liver transplant. Lasix  is effective at current low dose with no side effects.  Blood pressure and heart rate are well-managed. Cardiac ultrasound from February was normal. - Continue current dose of Lasix   Bleeding esophageal varices Bleeding esophageal varices with preference for non-invasive procedures due to bleeding risk. Stress test preferred over catheterization to avoid heparin  use, given recent variceal bleeding. - Avoid invasive procedures that require heparin  - Proceed with stress test instead of catheterization  Septic shock Septic shock may have mimicked myocardial infarction. Stress test planned to evaluate cardiac function and potential blockages. - Order stress test to evaluate cardiac function and potential blockages  Return in about 3 months (around 07/30/2024).    Reproductive/Obstetrics Endometrial CA                              Anesthesia Physical Anesthesia Plan  ASA: 4 and emergent  Anesthesia Plan: General   Post-op Pain Management: Minimal or no pain anticipated   Induction: Intravenous and Rapid sequence  PONV Risk Score and Plan: 3 and Treatment may vary due to age or medical condition, Ondansetron  and Dexamethasone  Airway Management Planned: Oral ETT and Video Laryngoscope Planned  Additional Equipment: None  Intra-op Plan:   Post-operative Plan: Extubation in OR and Possible Post-op intubation/ventilation  Informed Consent: I have reviewed the patients History and Physical, chart, labs and discussed the procedure including the risks, benefits and alternatives for the proposed anesthesia with the patient or authorized representative who has indicated his/her understanding and acceptance.     Dental advisory given  Plan Discussed with: CRNA and Surgeon  Anesthesia Plan Comments: (Discussed risks of anesthesia with patient, including PONV, sore throat, lip/dental/eye damage, aspiration. Rare risks  discussed as well, such as cardiorespiratory and neurological sequelae, and allergic reactions.  Discussed the role of CRNA in patient's perioperative care. Patient understands. Patient counseled on being higher risk for anesthesia due to comorbidities: cirrhosis, hematemesis. Patient was told about increased risk of cardiac and respiratory events, including death. Discussed possible prolonged intubation depending on how the procedure goes.)         Anesthesia Quick Evaluation

## 2024-05-03 NOTE — OR Nursing (Signed)
 BP dropped to 78/45 and HR going up in the mid to high 120's  Dr Lincoln Renshaw notified. He assessed the situation and hun Albumin  IV. Patient stayed A and O throughout. She did complain of nausea but no pain. I gave her Zofran   1515 250 cc Albumin  complete

## 2024-05-03 NOTE — Inpatient Diabetes Management (Signed)
 Inpatient Diabetes Program Recommendations  AACE/ADA: New Consensus Statement on Inpatient Glycemic Control   Target Ranges:  Prepandial:   less than 140 mg/dL      Peak postprandial:   less than 180 mg/dL (1-2 hours)      Critically ill patients:  140 - 180 mg/dL    Latest Reference Range & Units 05/03/24 09:49  Glucose 70 - 99 mg/dL 025 (H)    Review of Glycemic Control  Diabetes history: DM2 Outpatient Diabetes medications: Lantus  60 units daily, Humalog 8 units TID with meals, Metformin  1000 mg BID, Invokana 300 mg daily, FreeStyle Libre 3  Current orders for Inpatient glycemic control: Lantus  10 units daily, Novolog  0-15 units TID with meals, Novolog  0-5 units QHS  Inpatient Diabetes Program Recommendations:    Insulin : May want to consider increasing Lantus  (or Semglee ) to 30 units daily. Once diet ordered and patient eating well, may need meal coverage insulin  as well.  Thanks, Beacher Limerick, RN, MSN, CDCES Diabetes Coordinator Inpatient Diabetes Program [phone] (Team Pager from 8am to 5pm)

## 2024-05-03 NOTE — ED Triage Notes (Signed)
 Pt BIB [facility] for dizziness, hematemesis, hypotension that started today.  Pt arrives with 18 R AC from EMS.

## 2024-05-03 NOTE — Procedures (Signed)
 Arterial Catheter Insertion Procedure Note  LETONYA MANGELS  [id number]  February 09, 1966  Date:05/03/24  Time:9:18 PM    Provider Performing: Gara July Rust-Chester    Procedure: Insertion of Arterial Line (11914) with US  guidance (78295)   Indication(s) Blood pressure monitoring and/or need for frequent ABGs  Consent Risks of the procedure as well as the alternatives and risks of each were explained to the patient and/or caregiver.  Consent for the procedure was obtained and is signed in the bedside chart  Anesthesia Lidocaine  SQ and fentanyl  IVP   Time Out Verified patient identification, verified procedure, site/side was marked, verified correct patient position, special equipment/implants available, medications/allergies/relevant history reviewed, required imaging and test results available.   Sterile Technique Maximal sterile technique including full sterile barrier drape, hand hygiene, sterile gown, sterile gloves, mask, hair covering, sterile ultrasound probe cover (if used).   Procedure Description Area of catheter insertion was cleaned with chlorhexidine  and draped in sterile fashion. With real-time ultrasound guidance an arterial catheter was placed into the right femoral artery.  Appropriate arterial tracings confirmed on monitor.     Complications/Tolerance None; patient tolerated the procedure well.   EBL Minimal   Specimen(s) None   Eliott Guess, AGACNP-BC Acute Care Nurse Practitioner [facility]   [phone] / [phone] Please see Amion for details.

## 2024-05-03 NOTE — Consult Note (Incomplete)
 NAME:  Abigail Molina, MRN:  [id number], DOB:  01-15-1966, LOS: 0 ADMISSION DATE:  05/03/2024, CONSULTATION DATE:  05/03/24 REFERRING MD:  Dr. Jeane Molina, CHIEF COMPLAINT:  hematemesis/ Variceal bleeding   History of Present Illness:  58 yo F presenting to [facility] ED from home via EMS on 05/03/24 for evaluation of hematemesis.  History provided per chart review and patient bedside report. Patient in her normal state of health until waking up on 05/03/24, she reported sudden nausea and hematemesis- bright red blood with clots. She then had 3-4 more episodes of moderate hematemesis. She also endorsed feeling light-headed. She denied chest pain, dyspnea, abdominal pain, melena. EMS was called, finding the patient hypotensive with sbp in the 80's. 500 mL bolus administered.  Of note patient seen in April 2025 with melena, EGD revealed G3 varices which were banded. She was instructed to make an appointment at [facility], where she receives her healthcare and is being monitored for potential liver transplant, to have additional banding done in 4 weeks. The patient unfortunately was unable to be seen within that timeframe and returned to [facility] GI on 04/28/24 for repeat banding. Patient also underwent recent large volume paracentesis in mid May removing 4 L.  ED course: Upon arrival, patient alert and responsive, with tachypnea/ mild tachycardia and marginal BP. LAbs significant for chronic hyponatremia, hypochloremia, hyperglycemia, mild NAGMA, AKI, Transaminitis, elevated Lipase and elevated INR at 1.5. GI consulted who will take patient for EGD. TRH consulted for admission. Medications given: octreotide  bolus and infusion, Protonix  bolus 80 mg, Rocephin , NS bolus 500 mL, Zofran  IV Initial Vitals: 98.4, 22, 110, 96/54 & 100% on RA Significant labs: (Labs/ Imaging personally reviewed) I, Abigail Molina, AGACNP-BC, personally viewed and interpreted this ECG. EKG Interpretation: Date: 05/03/24, EKG Time: 09:52, Rate: 110,  Rhythm: ST, QRS Axis: borderline RAD Intervals: prolonged QT, ST/T Wave abnormalities: none, Narrative Interpretation: ST with prolonged QTc  Hospital Course: TRH admitted patient to SDU. Patient went urgently for EGD that revealed esophageal ulcerations underneath banding done 5 days ago. Post procedure patient developed intermittent persistent small to moderate hematemesis with clots. She became hypotensive, vasopressors initiated and PCCM consulted. She received emergent blood and FFP. Collaborative discussions between TRH, GI, IR and PCCM. Patient eligible and amenable for a TIPS. Attempting to facilitate urgent TIPS overnight due to hypotensive and persistent hematemesis. Hgb dropped from 7.6 to 6.1 after a unit of pRBC's. Follow up labs significant for ABLA, hyperkalemia, worsening NAGMA & AKI, hyperglycemia, mildly elevated troponin and severe lactic acidosis with mild leukocytosis.  Most recent: Chemistry: Na+:133, K+: 5.6, BUN/Cr.: 36/ 1.29, Serum CO2/ AG: 17/ 15, glucose: 300 Hematology: WBC: 11.7, Hgb: 7.7 > 7.6 > 6.1,  Troponin: 7 > 23 > 137, Lactic: >9.0 VBG: 7.31/29/47/14.6  Upper Endoscopy 05/03/24: Grade III varices, 3 oozing esophageal ulcers found under recent banding. Hematin PCCM consulted for assistance in management and monitoring due to circulatory shock s/t ABLA from variceal bleeding in the setting of NASH on vasopressor support.  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 EGD performed showing esophageal ulcers underneath recent banding. Developed circulatory shock s/t ABLA from variceal bleeding in the setting of NASH on vasopressor support. PCCM consulted. IR consulted for emergent TIPS.  Interim  History / Subjective:  Patient alert and responsive, only current complaint acute on chronic R shoulder pain. Still having frequent, multiple times an hour of small-moderate hematemesis with clots. Cousin and emergency contact, Abigail Molina, updated over the phone. All questions and concerns answered at this time.  Objective    Blood pressure (!) 97/52, pulse (!) 114, temperature 98.7 F (37.1 C), resp. rate (!) 22, height 5\' 4"  (1.626 m), weight 86.8 kg, last menstrual period 10/15/2016, SpO2 100%.        Intake/Output Summary (Last 24 hours) at 05/03/2024 1954 Last data filed at 05/03/2024 1902 Gross per 24 hour  Intake 1518.75 ml  Output 1000 ml  Net 518.75 ml   Filed Weights   05/03/24 4098 05/03/24 1241  Weight: 87.1 kg 86.8 kg    Examination: General: Adult female, critically ill, lying in bed, NAD HEENT: MM pale/dry, anicteric, atraumatic, neck supple Neuro: A&O x 4, able to follow commands, PERRL +3, MAE CV: s1s2 RRR, ST on monitor, no r/m/g Pulm: Regular, non labored on RA , breath sounds clear-BUL & diminished-BLL GI: soft, distended, non tender, bs x 4 GU: pure wick in place with clear yellow urine Skin: scattered excoriation from scratching noted Extremities: warm/dry, pulses + 2 R/P, no edema noted  Resolved problem list   Assessment and Plan  Acute Variceal Bleeding secondary to esophageal varices in the setting of NASH cirrhosis Patient underwent an EGD to evaluate bands placed revealing esophageal ulcers. GI recommended TIPS, IR urgently consulted to evaluate for TIPS. Multiple discussions with IR & OR to facilitate procedure. Due to lack of OR availability, because of other urgent cases, decision made to reach out to [facility] for potential emergent transfer. Intensivist aware of plan below and in agreement. Emergent TIPS to be performed at [facility] due to availability of OR, transfer cancelled.  - 1 unit of emergent blood transfused with plan to stay 2 units ahead, 2 units  of FFP ordered > after repeat Hgb 2 units of pRBC's ordered for transfusion - continue Octreotide  infusion - initiate Protonix  IV BID - Maintain up to date type & screen - continuous cardiac monitoring - NPO - continue Ceftriaxone  for SBP prophylaxis - Monitor for s/s of bleeding - Daily CBC, PT/ INR monitoring PRN - Transfuse for Hgb <8 - GI following, appreciate input - IR urgently consulted for TIPS evaluation  Circulatory Shock suspect hemorrhagic secondary to moderate but persistent variceal bleeding ABLA Patient received 50 g of albumin , vitamin K administered SQ - continue levophed , wean as tolerated to maintain SBP > 90 & MAP > 60, add vasopressin  PRN - will need central line placement, and arterial line placement if possible - LR bolus 500 mL ordered  Acute Kidney Injury suspect pre-renal secondary to circulatory shock Hyperkalemia NAGMA- worsening Baseline Cr: 0.62, Cr on admission:1.05 > 1.29 - sodium bicarb, calcium , D50 & insulin  ordered - Strict I/O's: alert provider if UOP < 0.5 mL/kg/hr - gentle IVF hydration  - Daily BMP, replace electrolytes PRN - Avoid nephrotoxic agents as able, ensure adequate renal perfusion  Mildly Elevated Troponin suspect secondary to demand ischemia in the setting of Variceal bleeding Hypertension Hyperlipidemia - Trend troponin  - Order Echocardiogram PRN - hold outpatient anti-hypertensive due to hypotension, hold outpatient statin while strict NPO - continuous cardiac monitoring  Poorly controlled Type 2 Diabetes Mellitus Hemoglobin A1C: 6.4 (03/2024) - Monitor CBG Q 4 hours - SSI moderate dosing - continue glargine 10 units - target range while in ICU: 140-180 - follow ICU  hyper/hypo-glycemia protocol  Chronic R shoulder pain - outpatient pain regimen on hold due to strict NPO status - will order valium IV PRN for muscle spasms & lidocaine  patch  Seizure disorder history - change outpatient Keppra  to IV   Best Practice  (right click and "Reselect all SmartList Selections" daily)  Diet/type: NPO DVT prophylaxis SCD Pressure ulcer(s): N/A GI prophylaxis: PPI Lines: Central line, Arterial Line, and yes and it is still needed Foley:  N/A Code Status:  full code Last date of multidisciplinary goals of care discussion [05/03/24]  Labs   CBC: Recent Labs  Lab 05/03/24 0949 05/03/24 1646  WBC 5.1  --   NEUTROABS 3.6  --   HGB 7.7* 7.6*  HCT 25.6* 25.7*  MCV 75.1*  --   PLT 209  --     Basic Metabolic Panel: Recent Labs  Lab 05/03/24 0949  NA 130*  K 4.8  CL 95*  CO2 21*  GLUCOSE 353*  BUN 21*  CREATININE 1.05*  CALCIUM  8.5*  MG 2.0   GFR: Estimated Creatinine Clearance: 61.5 mL/min (A) (by C-G formula based on SCr of 1.05 mg/dL (H)). Recent Labs  Lab 05/03/24 0949  WBC 5.1    Liver Function Tests: Recent Labs  Lab 05/03/24 0949  AST 57*  ALT 23  ALKPHOS 228*  BILITOT 4.8*  PROT 6.7  ALBUMIN  2.0*   Recent Labs  Lab 05/03/24 0949  LIPASE 91*   No results for input(s): "AMMONIA" in the last 168 hours.  ABG    Component Value Date/Time   PHART 7.38 01/27/2024 0408   PCO2ART 40 01/27/2024 0408   PO2ART 113 (H) 01/27/2024 0408   HCO3 20.7 05/03/2024 1030   ACIDBASEDEF 3.8 (H) 05/03/2024 1030   O2SAT PENDING 05/03/2024 1030     Coagulation Profile: Recent Labs  Lab 05/03/24 0949  INR 1.5*    Cardiac Enzymes: No results for input(s): "CKTOTAL", "CKMB", "CKMBINDEX", "TROPONINI" in the last 168 hours.  HbA1C: Hemoglobin A1C  Date/Time Value Ref Range Status  11/04/2022 12:00 AM 7.8  Final    Comment:    care everywhere  12/06/2015 12:00 AM 5.6  Final   Hgb A1c MFr Bld  Date/Time Value Ref Range Status  04/18/2024 05:32 PM 6.4 (H) 4.8 - 5.6 % Final    Comment:    (NOTE) Pre diabetes:          5.7%-6.4%  Diabetes:              >6.4%  Glycemic control for   <7.0% adults with diabetes   01/26/2024 02:15 PM 8.5 (H) 4.8 - 5.6 % Final    Comment:     (NOTE)         Prediabetes: 5.7 - 6.4         Diabetes: >6.4         Glycemic control for adults with diabetes: <7.0     CBG: Recent Labs  Lab 04/28/24 0834 05/03/24 1254 05/03/24 1632 05/03/24 1936  GLUCAP 213* 328* 244* 240*    Review of Systems: Positives in BOLD  Gen: Denies fever, chills, weight change, fatigue, night sweats HEENT: Denies blurred vision, double vision, hearing loss, tinnitus, sinus congestion, rhinorrhea, sore throat, neck stiffness, dysphagia PULM: Denies shortness of breath, cough, sputum production, hemoptysis, wheezing CV: Denies chest pain, edema, orthopnea, paroxysmal nocturnal dyspnea, palpitations GI: Denies abdominal pain, nausea, vomiting, hematemesis, diarrhea, hematochezia, melena, constipation, change in bowel habits GU: Denies dysuria, hematuria, polyuria, oliguria, urethral discharge  Endocrine: Denies hot or cold intolerance, polyuria, polyphagia or appetite change Derm: Denies rash, dry skin, scaling or peeling skin change Heme: Denies easy bruising, bleeding, bleeding gums Neuro: Denies dizziness, headache, numbness, weakness, slurred speech, loss of memory or consciousness  Past Medical History:  She,  has a past medical history of Anemia, Anxiety, Arthritis, Cancer (HCC), Chronic pain, Cirrhosis of liver (HCC), Depression, Diabetes mellitus without complication (HCC), Fatty liver disease, nonalcoholic (2021), GERD (gastroesophageal reflux disease), Glaucoma, Hyperlipidemia, Hypertension, Meningioma (HCC), NSTEMI (non-ST elevated myocardial infarction) (HCC) (02/03/2024), OSA (obstructive sleep apnea) (05/08/2023), Overactive bladder, Pharmacologic therapy (11/02/2023), and Psoriasis (2015).   Surgical History:   Past Surgical History:  Procedure Laterality Date   ABDOMINAL HYSTERECTOMY  2007   CHOLECYSTECTOMY  2000   ESOPHAGEAL BANDING  04/28/2024   Procedure: ESOPHAGOSCOPY, WITH ESOPHAGEAL VARICES BAND LIGATION;  Surgeon: [name], MD;   Location: [facility] ENDOSCOPY;  Service: Endoscopy;;   ESOPHAGOGASTRODUODENOSCOPY N/A 03/22/2024   Procedure: EGD (ESOPHAGOGASTRODUODENOSCOPY);  Surgeon: [name], MD;  Location: [facility] ENDOSCOPY;  Service: Endoscopy;  Laterality: N/A;   ESOPHAGOGASTRODUODENOSCOPY N/A 04/28/2024   Procedure: EGD (ESOPHAGOGASTRODUODENOSCOPY);  Surgeon: [name], MD;  Location: [facility] ENDOSCOPY;  Service: Endoscopy;  Laterality: N/A;   ESOPHAGOGASTRODUODENOSCOPY N/A 05/03/2024   Procedure: EGD (ESOPHAGOGASTRODUODENOSCOPY);  Surgeon: [name], MD;  Location: [facility] ENDOSCOPY;  Service: Endoscopy;  Laterality: N/A;     Social History:   reports that she has never smoked. She has never used smokeless tobacco. She reports that she does not currently use alcohol . She reports that she does not use drugs.   Family History:  Her family history includes Alcohol  abuse in her cousin and maternal grandmother; Bipolar disorder in her cousin; Depression in her mother; Diabetes in her mother; Heart disease in her maternal grandfather; Hypertension in her mother; Obesity in her mother; Stroke in her paternal grandfather; Vision loss in her mother.   Allergies No Known Allergies   Home Medications  Prior to Admission medications   Medication Sig Start Date End Date Taking? Authorizing Provider  aspirin  81 MG tablet Take 81 mg by mouth daily.   Yes [provider]  Baclofen  5 MG TABS Take 2 tablets by mouth at bedtime.   Yes [provider]  Bismuth  Subsalicylate 525 MG/15ML SUSP Take 15 mLs (525 mg total) by mouth every 4 (four) hours as needed for diarrhea or loose stools 04/22/24  Yes Lorita Rosa, MD  buPROPion  (WELLBUTRIN  XL) 150 MG 24 hr tablet Take 1 tablet (150 mg total) by mouth daily. Patient taking differently: Take 150 mg by mouth every evening. 11/16/23 05/03/24 Yes Hisada, Ivan Marion, MD  canagliflozin (INVOKANA) 300 MG TABS tablet Take 300 mg by mouth daily before breakfast.   Yes [provider]  COSENTYX SENSOREADY, 300 MG, 150 MG/ML SOAJ Inject into the skin.   Yes [provider]  insulin  lispro (HUMALOG) 100 UNIT/ML injection Inject 8 Units into the skin 3 (three) times daily with meals.   Yes [provider]  lactulose  (CHRONULAC ) 10 GM/15ML solution Take 45 mLs (30 g total) by mouth 2 (two) times daily as needed for mild constipation or moderate constipation (titrate for 2-3 stools/day). 03/23/24  Yes Aisha Hove, MD  LANTUS  SOLOSTAR 100 UNIT/ML Solostar Pen Inject 30 Units into the skin daily. Patient taking differently: Inject 60 Units into the skin at bedtime. 03/23/24 05/24/25 Yes Aisha Hove, MD  levETIRAcetam  (KEPPRA ) 500 MG tablet Take 750 mg by mouth 2 (two) times daily.   Yes [provider]  MAGNESIUM -OXIDE 400 (240 Mg) MG tablet Take 2 tablets by mouth 2 (two) times daily. 10/14/23  Yes [provider]  metFORMIN  (GLUCOPHAGE -XR) 500 MG 24 hr tablet Take 1,000 mg by mouth 2 (two) times daily.   Yes [provider]  midodrine  (PROAMATINE ) 10 MG tablet Take 1 tablet (10 mg total) by mouth 3 (three) times daily with meals. 03/23/24  Yes Aisha Hove, MD  mirtazapine  (REMERON ) 15 MG tablet Take 1 tablet (15 mg total) by mouth at bedtime. 04/08/24 05/08/24 Yes Hisada, Ivan Marion, MD  naltrexone (DEPADE) 50 MG tablet Take 50 mg by mouth at bedtime.   Yes [provider]  ondansetron  (ZOFRAN -ODT) 4 MG disintegrating tablet Dissolve 1 tablet (4 mg total) in the mouth every twelve (12) hours as needed for nausea. 10/05/23 10/04/24 Yes [provider]  oxyCODONE  (OXY IR/ROXICODONE ) 5 MG immediate release tablet Take 1 tablet (5 mg total) by mouth every 6 (six) hours as needed for moderate pain (pain score 4-6). 04/22/24  Yes Lorita Rosa, MD  pantoprazole  (PROTONIX ) 40 MG tablet TAKE ONE TABLET BY MOUTH EVERY DAY 10/24/19  Yes McGowan, Cathleen Coach A, PA-C  promethazine (PHENERGAN) 12.5 MG tablet Take 12.5 mg  by mouth every 4 (four) hours as needed for nausea or vomiting. 05/23/22  Yes [provider]  scopolamine (TRANSDERM-SCOP) 1 MG/3DAYS Place 1 patch onto the skin every 3 (three) days. 04/11/24 04/11/25 Yes [provider]  spironolactone  (ALDACTONE ) 50 MG tablet Take 1 tablet (50 mg total) by mouth 2 (two) times daily. 03/23/24 06/21/24 Yes Aisha Hove, MD  thiamine  (VITAMIN B-1) 100 MG tablet Take 1 tablet (100 mg total) by mouth daily. 03/23/24  Yes Aisha Hove, MD  XIFAXAN  550 MG TABS tablet Take 550 mg by mouth 2 (two) times daily.   Yes [provider]  Zinc  Sulfate 220 (50 Zn) MG TABS Take 1 tablet (220 mg total) by mouth daily. 03/23/24  Yes Aisha Hove, MD  zolpidem  (AMBIEN ) 5 MG tablet Take 5 mg by mouth at bedtime as needed.   Yes [provider]  Accu-Chek Softclix Lancets lancets SMARTSIG:Topical    [provider]  BD PEN NEEDLE NANO 2ND GEN 32G X 4 MM MISC USE 1 NIGHTLY 09/28/23   [provider]  citalopram  (CELEXA ) 20 MG tablet Take 20 mg by mouth at bedtime. Patient not taking: Reported on 05/03/2024    [provider]  Continuous Glucose Sensor (FREESTYLE LIBRE 3 SENSOR) MISC by Does not apply route daily.    [provider]  Ferrous Sulfate  (SLOW FE) 142 (45 Fe) MG TBCR Take 1 tablet by mouth daily. Patient not taking: Reported on 05/03/2024 09/12/21   [provider]  furosemide  (LASIX ) 20 MG tablet Take 1 tablet (20 mg total) by mouth 2 (two) times daily. Patient not taking: Reported on 05/03/2024 03/23/24   Aisha Hove, MD  gabapentin  (NEURONTIN ) 300 MG capsule Take 300 mg by mouth 3 (three) times daily. Patient not taking: Reported on 05/03/2024 06/02/23 06/01/24  [provider]  Insulin  Pen Needle 32G X 6 MM MISC 1 Syringe by Does not apply route. Use with Victoza .    [provider]  rosuvastatin  (CRESTOR ) 20 MG tablet Take 1 tablet (20 mg total) by  mouth daily. Patient not taking: Reported on 05/03/2024 02/09/24   Donaciano Frizzle, MD  Sharps Container (BD SHARPS COLLECTOR) MISC Use as directed to dispose of Cosentyx pens. 10/05/23   [provider]  Critical care time: 70 minutes     Eliott Guess, AGACNP-BC Acute Care Nurse Practitioner [facility]   [phone] / [phone] Please see Amion for details.

## 2024-05-03 NOTE — Consult Note (Signed)
 [name], MD [facility]  [address], [address] [place], [place] [address] Phone: [phone] Fax : [phone]  Consultation  Referring Provider:     Dr. Drenda Gentle Primary Care Physician:  [facility] [place] [facility] Primary Gastroenterologist:  [facility]         Reason for Consultation:     Variceal bleeding  Date of Admission:  05/03/2024 Date of Consultation:  05/03/2024         HPI:   Abigail Molina is a 58 y.o. female with a history of [name] cirrhosis.  The patient was in the hospital back in April with melena and had an upper endoscopy.  The patient had banding of her esophageal varices at that time and was instructed to follow-up with [facility] for repeat banding in 4 weeks.  The patient could not get an appointment so therefore she came to see me for repeat banding on 29 May, 5 days ago.  The patient reports that she had been doing well but started to have vomiting blood this morning.  She vomited blood 3 times this morning has not had any further bleeding or vomiting since then. The patient was noted to have hypotension on admission.  The patient also has a history of ascites.  She was given normal saline bolus of 500 cc with her hypotension improving.  She denies any black stools or bloody stools.  The patient's MELD 3.0 showed a score of 24 which confers an 89% survival rate at 90 days. The patient's hemoglobin 11 days ago was 8.6 and on admission today it was 7.7.  The patient's platelets have been good with her platelets this morning of 209.  Past Medical History:  Diagnosis Date   Anemia    Anxiety    Arthritis    Cancer (HCC)    Endometrial cancer   Chronic pain    Cirrhosis of liver (HCC)    Depression    Diabetes mellitus without complication (HCC)    Fatty liver disease, nonalcoholic 2021   GERD (gastroesophageal reflux disease)    Glaucoma    Hyperlipidemia    Hypertension    Meningioma (HCC)    NSTEMI (non-ST elevated myocardial infarction) (HCC) 02/03/2024   OSA (obstructive  sleep apnea) 05/08/2023   Overactive bladder    Pharmacologic therapy 11/02/2023   Psoriasis 2015    Past Surgical History:  Procedure Laterality Date   ABDOMINAL HYSTERECTOMY  2007   CHOLECYSTECTOMY  2000   ESOPHAGEAL BANDING  04/28/2024   Procedure: ESOPHAGOSCOPY, WITH ESOPHAGEAL VARICES BAND LIGATION;  Surgeon: [name], MD;  Location: [facility] ENDOSCOPY;  Service: Endoscopy;;   ESOPHAGOGASTRODUODENOSCOPY N/A 03/22/2024   Procedure: EGD (ESOPHAGOGASTRODUODENOSCOPY);  Surgeon: [name], MD;  Location: [facility] ENDOSCOPY;  Service: Endoscopy;  Laterality: N/A;   ESOPHAGOGASTRODUODENOSCOPY N/A 04/28/2024   Procedure: EGD (ESOPHAGOGASTRODUODENOSCOPY);  Surgeon: [name], MD;  Location[facility] ENDOSCOPY;  Service: Endoscopy;  Laterality: N/A;    Prior to Admission medications   Medication Sig Start Date End Date Taking? Authorizing Provider  aspirin  81 MG tablet Take 81 mg by mouth daily.   Yes [provider]  Baclofen  5 MG TABS Take 2 tablets by mouth at bedtime.   Yes [provider]  Bismuth  Subsalicylate 525 MG/15ML SUSP Take 15 mLs (525 mg total) by mouth every 4 (four) hours as needed for diarrhea or loose stools 04/22/24  Yes Lorita Rosa, MD  buPROPion  (WELLBUTRIN  XL) 150 MG 24 hr tablet Take 1 tablet (150 mg  total) by mouth daily. Patient taking differently: Take 150 mg by mouth every evening. 11/16/23 05/03/24 Yes Hisada, Ivan Marion, MD  canagliflozin (INVOKANA) 300 MG TABS tablet Take 300 mg by mouth daily before breakfast.   Yes [provider]  COSENTYX SENSOREADY, 300 MG, 150 MG/ML SOAJ Inject into the skin.   Yes [provider]  insulin  lispro (HUMALOG) 100 UNIT/ML injection Inject 8 Units into the skin 3 (three) times daily with meals.   Yes [provider]  lactulose  (CHRONULAC ) 10 GM/15ML solution Take 45 mLs (30 g total) by mouth 2 (two) times daily as needed for mild constipation or moderate constipation (titrate for 2-3 stools/day).  03/23/24  Yes Aisha Hove, MD  LANTUS  SOLOSTAR 100 UNIT/ML Solostar Pen Inject 30 Units into the skin daily. Patient taking differently: Inject 60 Units into the skin at bedtime. 03/23/24 05/24/25 Yes Aisha Hove, MD  levETIRAcetam  (KEPPRA ) 500 MG tablet Take 750 mg by mouth 2 (two) times daily.   Yes [provider]  MAGNESIUM -OXIDE 400 (240 Mg) MG tablet Take 2 tablets by mouth 2 (two) times daily. 10/14/23  Yes [provider]  metFORMIN  (GLUCOPHAGE -XR) 500 MG 24 hr tablet Take 1,000 mg by mouth 2 (two) times daily.   Yes [provider]  midodrine  (PROAMATINE ) 10 MG tablet Take 1 tablet (10 mg total) by mouth 3 (three) times daily with meals. 03/23/24  Yes Aisha Hove, MD  mirtazapine  (REMERON ) 15 MG tablet Take 1 tablet (15 mg total) by mouth at bedtime. 04/08/24 05/08/24 Yes Hisada, Ivan Marion, MD  naltrexone (DEPADE) 50 MG tablet Take 50 mg by mouth at bedtime.   Yes [provider]  ondansetron  (ZOFRAN -ODT) 4 MG disintegrating tablet Dissolve 1 tablet (4 mg total) in the mouth every twelve (12) hours as needed for nausea. 10/05/23 10/04/24 Yes [provider]  oxyCODONE  (OXY IR/ROXICODONE ) 5 MG immediate release tablet Take 1 tablet (5 mg total) by mouth every 6 (six) hours as needed for moderate pain (pain score 4-6). 04/22/24  Yes Lorita Rosa, MD  pantoprazole  (PROTONIX ) 40 MG tablet TAKE ONE TABLET BY MOUTH EVERY DAY 10/24/19  Yes McGowan, Cathleen Coach A, PA-C  promethazine (PHENERGAN) 12.5 MG tablet Take 12.5 mg by mouth every 4 (four) hours as needed for nausea or vomiting. 05/23/22  Yes [provider]  scopolamine (TRANSDERM-SCOP) 1 MG/3DAYS Place 1 patch onto the skin every 3 (three) days. 04/11/24 04/11/25 Yes [provider]  spironolactone  (ALDACTONE ) 50 MG tablet Take 1 tablet (50 mg total) by mouth 2 (two) times daily. 03/23/24 06/21/24 Yes Aisha Hove, MD  thiamine  (VITAMIN B-1) 100 MG tablet Take 1  tablet (100 mg total) by mouth daily. 03/23/24  Yes Aisha Hove, MD  XIFAXAN  550 MG TABS tablet Take 550 mg by mouth 2 (two) times daily.   Yes [provider]  Zinc  Sulfate 220 (50 Zn) MG TABS Take 1 tablet (220 mg total) by mouth daily. 03/23/24  Yes Aisha Hove, MD  zolpidem  (AMBIEN ) 5 MG tablet Take 5 mg by mouth at bedtime as needed.   Yes [provider]  Accu-Chek Softclix Lancets lancets SMARTSIG:Topical    [provider]  BD PEN NEEDLE NANO 2ND GEN 32G X 4 MM MISC USE 1 NIGHTLY 09/28/23   [provider]  citalopram  (CELEXA ) 20 MG tablet Take 20 mg by mouth at bedtime. Patient not taking: Reported on 05/03/2024    [provider]  Continuous Glucose Sensor (FREESTYLE LIBRE 3 SENSOR) MISC by Does not  apply route daily.    [provider]  Ferrous Sulfate  (SLOW FE) 142 (45 Fe) MG TBCR Take 1 tablet by mouth daily. Patient not taking: Reported on 05/03/2024 09/12/21   [provider]  furosemide  (LASIX ) 20 MG tablet Take 1 tablet (20 mg total) by mouth 2 (two) times daily. Patient not taking: Reported on 05/03/2024 03/23/24   Aisha Hove, MD  gabapentin  (NEURONTIN ) 300 MG capsule Take 300 mg by mouth 3 (three) times daily. Patient not taking: Reported on 05/03/2024 06/02/23 06/01/24  [provider]  Insulin  Pen Needle 32G X 6 MM MISC 1 Syringe by Does not apply route. Use with Victoza .    [provider]  rosuvastatin  (CRESTOR ) 20 MG tablet Take 1 tablet (20 mg total) by mouth daily. Patient not taking: Reported on 05/03/2024 02/09/24   Donaciano Frizzle, MD  Sharps Container (BD SHARPS COLLECTOR) MISC Use as directed to dispose of Cosentyx pens. 10/05/23   [provider]    Family History  Problem Relation Age of Onset   Diabetes Mother    Hypertension Mother    Depression Mother    Obesity Mother    Vision loss Mother    Alcohol  abuse Cousin    Bipolar disorder Cousin    Heart  disease Maternal Grandfather    Alcohol  abuse Maternal Grandmother    Stroke Paternal Grandfather      Social History   Tobacco Use   Smoking status: Never   Smokeless tobacco: Never  Vaping Use   Vaping status: Never Used  Substance Use Topics   Alcohol  use: Not Currently    Comment:  Rarely. One or two drinks a year    Drug use: No    Allergies as of 05/03/2024   (No Known Allergies)    Review of Systems:    All systems reviewed and negative except where noted in HPI.   Physical Exam:  Vital signs in last 24 hours: Pulse Rate:  [106-110] 106 (06/03 1130) Resp:  [20-22] 20 (06/03 1130) BP: (96-119)/(54-58) 119/58 (06/03 1130) SpO2:  [99 %-100 %] 99 % (06/03 1130) Weight:  [87.1 kg] 87.1 kg (06/03 0952)   General:   Pleasant, cooperative in NAD Head:  Normocephalic and atraumatic. Eyes:   No icterus.   Conjunctiva pink. PERRLA. Ears:  Normal auditory acuity. Neck:  Supple; no masses or thyroidomegaly Lungs: Respirations even and unlabored. Lungs clear to auscultation bilaterally.   No wheezes, crackles, or rhonchi.  Heart:  Regular rate and rhythm;  Without murmur, clicks, rubs or gallops Abdomen:  Soft, positive distention with ascites, nontender. Normal bowel sounds. No appreciable masses or hepatomegaly.  No rebound or guarding.  Rectal:  Not performed. Msk:  Symmetrical without gross deformities.   Extremities:  Without edema, cyanosis or clubbing. Neurologic:  Alert and oriented x3;  grossly normal neurologically. Skin:  Intact without significant lesions or rashes. Cervical Nodes:  No significant cervical adenopathy. Psych:  Alert and cooperative. Normal affect.  LAB RESULTS: Recent Labs    05/03/24 0949  WBC 5.1  HGB 7.7*  HCT 25.6*  PLT 209   BMET Recent Labs    05/03/24 0949  NA 130*  K 4.8  CL 95*  CO2 21*  GLUCOSE 353*  BUN 21*  CREATININE 1.05*  CALCIUM  8.5*   LFT Recent Labs    05/03/24 0949  PROT 6.7  ALBUMIN  2.0*  AST 57*   ALT 23  ALKPHOS 228*  BILITOT 4.8*   PT/INR Recent  Labs    05/03/24 0949  LABPROT 18.0*  INR 1.5*    STUDIES: No results found.    Impression / Plan:   Assessment: Principal Problem:   GI bleed   Abigail Molina is a 58 y.o. y/o female with banding for grade 3 esophageal varices 5 days ago with a report of vomiting blood 3 times this morning.  The patient's hemoglobin had gone from 8.69 days ago to 7.7 today.  I have contacted interventional radiology for possible TIPS and they will be evaluating the patient but the concern is that her MELD 3.0 score is 24.  Plan: The patient is not a good candidate for TIPS although that may be the only option if bleeding cannot be controlled with the octreotide  she has been started on and repeat upper endoscopy.  The patient should be transfused as needed with trying to keep the hemoglobin above 7.  Continue IV octreotide  for a total of 72 hours.  PPI IV twice daily  Continue serial CBCs and transfuse PRN Maintain 2 large-bore IV lines Please page GI with any acute hemodynamic changes, or signs of active GI bleeding    Thank you for involving me in the care of this patient.      LOS: 0 days   [name], MD, MD. Sylvan Evener 05/03/2024, 12:38 PM,  Pager [phone] 7am-5pm  Check AMION for 5pm -7am coverage and on weekends   Note: This dictation was prepared with Dragon dictation along with smaller phrase technology. Any transcriptional errors that result from this process are unintentional.

## 2024-05-03 NOTE — Consult Note (Signed)
 Chief Complaint:  Variceal bleeding s/p banding on 5/29 in the setting of NASH cirrhosis  Procedure: Transjugular Intrahepatic Portosystemic Shunt (TIPS)  Referring Provider(s): Dr. Horald Lyme  Supervising Physician: Elene Griffes  Patient Status: [facility] - In-pt  History of Present Illness: Abigail Molina is a 58 y.o. 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 She presented to the ED in 03/2024 with melena and underwent an upper endoscopy with banding of her esophageal varices. She was counseled to follow up with her GI at [facility] for repeat banding in 4 weeks. Unfortunately she was unable to get an appointment with them and subsequently followed up with Dr. Ole Berkeley on 5/29 for repeat banding. She was initially doing well following the procedure; however this morning she presented to the ED due to multiple episodes of hematemesis.   IR consulted to discuss TIPS procedure with the patient. Patient is lying in bed in ICU with nurses at the bedside. Dr. Irine Manning to the bedside to discuss the possibility of a TIPS procedure with the patient. Patient is currently on lactulose  twice daily for encephalopathy and has had an increasing need for therapeutic paracentesis in the past month. She reports having previously discussed this with her GI provider at [facility] who did not feel that she was an appropriate candidate. Dr. Julietta Ogren thoroughly explained the TIPS procedure and associated risks to the patient and reiterated the sentiment that the patient is not an ideal candidate for an elective TIPS procedure as there may be serious risks and outcomes should she proceed with a TIPS. Patient verbalized understanding. She is scheduled to return to Endoscopy with Dr. Horald Lyme for possible re-banding of varices.   Patient is Full Code  Past Medical History:  Diagnosis Date   Anemia    Anxiety    Arthritis    Cancer (HCC)    Endometrial cancer   Chronic pain    Cirrhosis of liver (HCC)     Depression    Diabetes mellitus without complication (HCC)    Fatty liver disease, nonalcoholic 2021   GERD (gastroesophageal reflux disease)    Glaucoma    Hyperlipidemia    Hypertension    Meningioma (HCC)    NSTEMI (non-ST elevated myocardial infarction) (HCC) 02/03/2024   OSA (obstructive sleep apnea) 05/08/2023   Overactive bladder    Pharmacologic therapy 11/02/2023   Psoriasis 2015    Past Surgical History:  Procedure Laterality Date   ABDOMINAL HYSTERECTOMY  2007   CHOLECYSTECTOMY  2000   ESOPHAGEAL BANDING  04/28/2024   Procedure: ESOPHAGOSCOPY, WITH ESOPHAGEAL VARICES BAND LIGATION;  Surgeon: [name], MD;  Location: [facility] ENDOSCOPY;  Service: Endoscopy;;   ESOPHAGOGASTRODUODENOSCOPY N/A 03/22/2024   Procedure: EGD (ESOPHAGOGASTRODUODENOSCOPY);  Surgeon: [name], MD;  Location: [facility] ENDOSCOPY;  Service: Endoscopy;  Laterality: N/A;   ESOPHAGOGASTRODUODENOSCOPY N/A 04/28/2024   Procedure: EGD (ESOPHAGOGASTRODUODENOSCOPY);  Surgeon: [name], MD;  Location: [facility] ENDOSCOPY;  Service: Endoscopy;  Laterality: N/A;    Allergies: Patient has no known allergies.  Medications: Prior to Admission medications   Medication Sig Start Date End Date Taking? Authorizing Provider  aspirin  81 MG tablet Take 81 mg by mouth daily.   Yes [provider]  Baclofen  5 MG TABS Take 2 tablets by mouth at bedtime.   Yes [provider]  Bismuth  Subsalicylate 525 MG/15ML SUSP Take 15 mLs (525 mg total) by mouth every 4 (four) hours as needed for diarrhea or loose stools  04/22/24  Yes Lorita Rosa, MD  buPROPion  (WELLBUTRIN  XL) 150 MG 24 hr tablet Take 1 tablet (150 mg total) by mouth daily. Patient taking differently: Take 150 mg by mouth every evening. 11/16/23 05/03/24 Yes Hisada, Ivan Marion, MD  canagliflozin (INVOKANA) 300 MG TABS tablet Take 300 mg by mouth daily before breakfast.   Yes [provider]  COSENTYX SENSOREADY, 300 MG, 150 MG/ML SOAJ Inject into the  skin.   Yes [provider]  insulin  lispro (HUMALOG) 100 UNIT/ML injection Inject 8 Units into the skin 3 (three) times daily with meals.   Yes [provider]  lactulose  (CHRONULAC ) 10 GM/15ML solution Take 45 mLs (30 g total) by mouth 2 (two) times daily as needed for mild constipation or moderate constipation (titrate for 2-3 stools/day). 03/23/24  Yes Aisha Hove, MD  LANTUS  SOLOSTAR 100 UNIT/ML Solostar Pen Inject 30 Units into the skin daily. Patient taking differently: Inject 60 Units into the skin at bedtime. 03/23/24 05/24/25 Yes Aisha Hove, MD  levETIRAcetam  (KEPPRA ) 500 MG tablet Take 750 mg by mouth 2 (two) times daily.   Yes [provider]  MAGNESIUM -OXIDE 400 (240 Mg) MG tablet Take 2 tablets by mouth 2 (two) times daily. 10/14/23  Yes [provider]  metFORMIN  (GLUCOPHAGE -XR) 500 MG 24 hr tablet Take 1,000 mg by mouth 2 (two) times daily.   Yes [provider]  midodrine  (PROAMATINE ) 10 MG tablet Take 1 tablet (10 mg total) by mouth 3 (three) times daily with meals. 03/23/24  Yes Aisha Hove, MD  mirtazapine  (REMERON ) 15 MG tablet Take 1 tablet (15 mg total) by mouth at bedtime. 04/08/24 05/08/24 Yes Hisada, Ivan Marion, MD  naltrexone (DEPADE) 50 MG tablet Take 50 mg by mouth at bedtime.   Yes [provider]  ondansetron  (ZOFRAN -ODT) 4 MG disintegrating tablet Dissolve 1 tablet (4 mg total) in the mouth every twelve (12) hours as needed for nausea. 10/05/23 10/04/24 Yes [provider]  oxyCODONE  (OXY IR/ROXICODONE ) 5 MG immediate release tablet Take 1 tablet (5 mg total) by mouth every 6 (six) hours as needed for moderate pain (pain score 4-6). 04/22/24  Yes Lorita Rosa, MD  pantoprazole  (PROTONIX ) 40 MG tablet TAKE ONE TABLET BY MOUTH EVERY DAY 10/24/19  Yes McGowan, Cathleen Coach A, PA-C  promethazine (PHENERGAN) 12.5 MG tablet Take 12.5 mg by mouth every 4 (four) hours as needed for nausea or vomiting.  05/23/22  Yes [provider]  scopolamine (TRANSDERM-SCOP) 1 MG/3DAYS Place 1 patch onto the skin every 3 (three) days. 04/11/24 04/11/25 Yes [provider]  spironolactone  (ALDACTONE ) 50 MG tablet Take 1 tablet (50 mg total) by mouth 2 (two) times daily. 03/23/24 06/21/24 Yes Aisha Hove, MD  thiamine  (VITAMIN B-1) 100 MG tablet Take 1 tablet (100 mg total) by mouth daily. 03/23/24  Yes Aisha Hove, MD  XIFAXAN  550 MG TABS tablet Take 550 mg by mouth 2 (two) times daily.   Yes [provider]  Zinc  Sulfate 220 (50 Zn) MG TABS Take 1 tablet (220 mg total) by mouth daily. 03/23/24  Yes Aisha Hove, MD  zolpidem  (AMBIEN ) 5 MG tablet Take 5 mg by mouth at bedtime as needed.   Yes [provider]  Accu-Chek Softclix Lancets lancets SMARTSIG:Topical    [provider]  BD PEN NEEDLE NANO 2ND GEN 32G X 4 MM MISC USE 1 NIGHTLY 09/28/23   [provider]  citalopram  (CELEXA ) 20 MG tablet Take 20 mg by mouth at bedtime. Patient not taking: Reported  on 05/03/2024    [provider]  Continuous Glucose Sensor (FREESTYLE LIBRE 3 SENSOR) MISC by Does not apply route daily.    [provider]  Ferrous Sulfate  (SLOW FE) 142 (45 Fe) MG TBCR Take 1 tablet by mouth daily. Patient not taking: Reported on 05/03/2024 09/12/21   [provider]  furosemide  (LASIX ) 20 MG tablet Take 1 tablet (20 mg total) by mouth 2 (two) times daily. Patient not taking: Reported on 05/03/2024 03/23/24   Aisha Hove, MD  gabapentin  (NEURONTIN ) 300 MG capsule Take 300 mg by mouth 3 (three) times daily. Patient not taking: Reported on 05/03/2024 06/02/23 06/01/24  [provider]  Insulin  Pen Needle 32G X 6 MM MISC 1 Syringe by Does not apply route. Use with Victoza .    [provider]  rosuvastatin  (CRESTOR ) 20 MG tablet Take 1 tablet (20 mg total) by mouth daily. Patient not taking: Reported on 05/03/2024 02/09/24    Donaciano Frizzle, MD  Sharps Container (BD SHARPS COLLECTOR) MISC Use as directed to dispose of Cosentyx pens. 10/05/23   [provider]     Family History  Problem Relation Age of Onset   Diabetes Mother    Hypertension Mother    Depression Mother    Obesity Mother    Vision loss Mother    Alcohol  abuse Cousin    Bipolar disorder Cousin    Heart disease Maternal Grandfather    Alcohol  abuse Maternal Grandmother    Stroke Paternal Grandfather     Social History   Socioeconomic History   Marital status: Widowed    Spouse name: Not on file   Number of children: Not on file   Years of education: Not on file   Highest education level: Associate degree: academic program  Occupational History   Not on file  Tobacco Use   Smoking status: Never   Smokeless tobacco: Never  Vaping Use   Vaping status: Never Used  Substance and Sexual Activity   Alcohol  use: Not Currently    Comment:  Rarely. One or two drinks a year    Drug use: No   Sexual activity: Not Currently    Birth control/protection: Abstinence  Other Topics Concern   Not on file  Social History Narrative   PT is not working right now. Pt worries about financial needs since her husband died in 06-04-2024 and she has no income. She applied for Widow's benefits in July but has not heard any information and the application is still pending. If possible please inquire about application status.     Social Drivers of Corporate investment banker Strain: Low Risk  (12/16/2023)   Received from [facility]   Overall Financial Resource Strain (CARDIA)    Difficulty of Paying Living Expenses: Not very hard  Food Insecurity: Food Insecurity Present (04/19/2024)   Hunger Vital Sign    Worried About Running Out of Food in the Last Year: Never true    Ran Out of Food in the Last Year: Sometimes true  Transportation Needs: Unmet Transportation Needs (04/19/2024)   PRAPARE - Administrator, Civil Service (Medical): Yes     Lack of Transportation (Non-Medical): Yes  Physical Activity: Insufficiently Active (01/09/2021)   Received from [facility]   Exercise Vital Sign    Days of Exercise per Week: 1 day    Minutes of Exercise per Session: 40 min  Stress: Stress Concern Present (09/17/2022)   Received  from [facility]   [facility] of Occupational Health - Occupational Stress Questionnaire    Feeling of Stress : Very much  Social Connections: Socially Isolated (04/19/2024)   Social Connection and Isolation Panel [NHANES]    Frequency of Communication with Friends and Family: Once a week    Frequency of Social Gatherings with Friends and Family: Twice a week    Attends Religious Services: Never    Database administrator or Organizations: No    Attends Banker Meetings: Never    Marital Status: Widowed    Review of Systems  Gastrointestinal:  Positive for vomiting (bloody).  Patient denies any headache, chest pain, shortness of breath, abdominal pain, N/V, or fever/chills. All other systems are negative.   Vital Signs: BP (!) 96/57 (BP Location: Right Arm)   Pulse (!) 110   Temp 98.4 F (36.9 C) (Oral)   Resp 20   Ht 5\' 4"  (1.626 m)   Wt 191 lb 5.8 oz (86.8 kg)   LMP 10/15/2016   SpO2 100%   BMI 32.85 kg/m   Physical Exam Vitals reviewed.  Constitutional:      Appearance: Normal appearance.  HENT:     Head: Normocephalic and atraumatic.     Mouth/Throat:     Mouth: Mucous membranes are moist.     Pharynx: Oropharynx is clear.  Cardiovascular:     Rate and Rhythm: Tachycardia present.  Pulmonary:     Effort: Pulmonary effort is normal.  Musculoskeletal:        General: Normal range of motion.     Cervical back: Normal range of motion.  Skin:    General: Skin is warm and dry.  Neurological:     Mental Status: She is alert and oriented to person, place, and time.  Psychiatric:        Judgment: Judgment normal.      Imaging: US  Paracentesis Result Date: 04/21/2024 INDICATION: 58 year old female with a history of NASH liver cirrhosis who presented to the ED with shortness of breath and abdominal pain for the past week. Request for therapeutic paracentesis. EXAM: ULTRASOUND GUIDED left PARACENTESIS MEDICATIONS: 1% lidocaine , 6 mL COMPLICATIONS: None immediate. PROCEDURE: Informed written consent was obtained from the patient after a discussion of the risks, benefits and alternatives to treatment. A timeout was performed prior to the initiation of the procedure. Initial ultrasound scanning demonstrates a large amount of ascites within the right lower abdominal quadrant. The right lower abdomen was prepped and draped in the usual sterile fashion. 1% lidocaine  was used for local anesthesia. Following this, a 19 gauge, 7-cm, Yueh catheter was introduced. An ultrasound image was saved for documentation purposes. The paracentesis was performed. The catheter was removed and a dressing was applied. The patient tolerated the procedure well without immediate post procedural complication. FINDINGS: A total of approximately 4 L of hazy yellow fluid was removed. IMPRESSION: Successful ultrasound-guided paracentesis yielding 4 liters of peritoneal fluid. Procedure performed by: Estella Helling, PA-C PLAN: The patient has required >/=2 paracenteses in a 30 day period and a formal evaluation by the [facility] has been arranged. Electronically Signed   By: Erica Hau M.D.   On: 04/21/2024 07:59   CT ABDOMEN PELVIS W CONTRAST Result Date: 04/18/2024 CLINICAL DATA:  Right lower quadrant pain EXAM: CT ABDOMEN AND PELVIS WITH CONTRAST TECHNIQUE: Multidetector CT imaging of the abdomen and pelvis was performed using the standard protocol following bolus administration of  intravenous contrast. RADIATION DOSE REDUCTION: This exam was performed according to the departmental  dose-optimization program which includes automated exposure control, adjustment of the mA and/or kV according to patient size and/or use of iterative reconstruction technique. CONTRAST:  75mL OMNIPAQUE  IOHEXOL  300 MG/ML  SOLN COMPARISON:  01/26/2024 FINDINGS: Lower chest: Small moderate left pleural effusion, slightly increased since prior study. Compressive atelectasis in the left lower lobe. Right lung base clear. Diffuse 3 vessel coronary artery disease. Hepatobiliary: Severely nodular shrunken liver compatible with cirrhosis. Prior cholecystectomy. No focal hepatic abnormality or biliary ductal dilatation. Pancreas: No focal abnormality or ductal dilatation. Spleen: Splenomegaly with a craniocaudal length of 18.7 cm. Scattered subcentimeter hypodensities throughout the spleen, similar prior study. Adrenals/Urinary Tract: No adrenal abnormality. No focal renal abnormality. No stones or hydronephrosis. Urinary bladder is unremarkable. Stomach/Bowel: Stomach, large and small bowel grossly unremarkable. Vascular/Lymphatic: Aortic atherosclerosis. No evidence of aneurysm or adenopathy. Reproductive: Prior hysterectomy.  No adnexal masses. Other: Large volume ascites Musculoskeletal: No acute bony abnormality. IMPRESSION: Changes of cirrhosis with splenomegaly and large volume ascites. Small to moderate left pleural effusion, increased since prior study. Compressive atelectasis in the left lower lobe. Coronary artery disease, aortic atherosclerosis. Electronically Signed   By: Janeece Mechanic M.D.   On: 04/18/2024 22:27    Labs:  CBC: Recent Labs    04/20/24 0558 04/21/24 0514 04/22/24 0351 05/03/24 0949  WBC 3.1* 2.9* 3.2* 5.1  HGB 9.3* 8.5* 8.6* 7.7*  HCT 31.4* 28.1* 29.0* 25.6*  PLT 190 165 165 209    COAGS: Recent Labs    01/26/24 0834 03/17/24 0939 04/18/24 2143 05/03/24 0949  INR 1.6* 1.4* 1.5* 1.5*  APTT 34 37*  --   --     BMP: Recent Labs    04/20/24 0558 04/21/24 0514  04/22/24 0351 05/03/24 0949  NA 129* 133* 124* 130*  K 3.5 3.7 3.9 4.8  CL 97* 102 95* 95*  CO2 23 24 22  21*  GLUCOSE 225* 240* 253* 353*  BUN 16 16 16  21*  CALCIUM  8.7* 8.7* 8.0* 8.5*  CREATININE 0.83 0.70 0.62 1.05*  GFRNONAA >60 >60 >60 >60    LIVER FUNCTION TESTS: Recent Labs    04/20/24 0558 04/21/24 0514 04/22/24 0351 05/03/24 0949  BILITOT 4.2* 3.2* 2.9* 4.8*  AST 51* 43* 44* 57*  ALT 21 19 18 23   ALKPHOS 318* 285* 334* 228*  PROT 7.3 6.5 7.0 6.7  ALBUMIN  2.2* 2.1* 2.1* 2.0*    TUMOR MARKERS: No results for input(s): "AFPTM", "CEA", "CA199", "CHROMGRNA" in the last 8760 hours.  Assessment and Plan:  Variceal bleeding s/p banding in the setting of NASH cirrhosis: ILEEN KAHRE is a 58 y.o. female with a history of NASH cirrhosis and variceal bleeding s/p banding on 4/22 and 5/29 with Dr. Horald Lyme who presents to [facility] due to multiple episodes of hematemesis. IR consulted to discuss possible TIPS procedure with patient prior to patient being taken back to Endoscopy for attempt at re-banding.   -Dr. Irine Manning and patient had a long discussion at the bedside regarding the TIPS procedure as well as risks and benefits of proceeding with TIPS -Current MELD 3.0 score of 24, Original MELD of 17; Child-Pugh of 12 -IR recommending no elective TIPS procedure at this time; however, should re-banding of esophageal varices fail and TIPS procedure is deemed necessary for life saving measures, IR will gladly proceed with intervention -Patient verbalized understanding and is in agreement with this plan. She is aware of the  risks involved with the TIPS procedure given her current health status and states that she wishes to move forward with the TIPS should she require emergent intervention -Dr. Ole Berkeley informed of this plan and will proceed with possible re-banding of esophageal varices  Please reach out to IR should this patient require an emergent TIPS intervention or for any additional  concerns.  Thank you for allowing our service to participate in [place] ALEYSIA OLTMANN 's care.    Electronically Signed: Mande Auvil M Nakota Ackert, PA-C   05/03/2024, 12:53 PM     I spent a total of 40 Minutes  in face to face in clinical consultation, greater than 50% of which was counseling/coordinating care for TIPS procedure.

## 2024-05-03 NOTE — Anesthesia Procedure Notes (Signed)
 Procedure Name: Intubation Date/Time: 05/03/2024 2:00 PM  Performed by: Niki Barter, CRNAPre-anesthesia Checklist: Patient identified, Emergency Drugs available, Suction available and Patient being monitored Patient Re-evaluated:Patient Re-evaluated prior to induction Oxygen Delivery Method: Circle system utilized Preoxygenation: Pre-oxygenation with 100% oxygen Induction Type: IV induction, Rapid sequence and Cricoid Pressure applied Laryngoscope Size: McGrath and 3 Grade View: Grade I Tube type: Oral Tube size: 6.5 mm Number of attempts: 1 Airway Equipment and Method: Stylet Placement Confirmation: ETT inserted through vocal cords under direct vision, positive ETCO2 and breath sounds checked- equal and bilateral Secured at: 21 cm Tube secured with: Tape Dental Injury: Teeth and Oropharynx as per pre-operative assessment

## 2024-05-03 NOTE — Transfer of Care (Signed)
 Immediate Anesthesia Transfer of Care Note  Patient: Abigail Molina  Procedure(s) Performed: EGD (ESOPHAGOGASTRODUODENOSCOPY)  Patient Location: Endoscopy Unit  Anesthesia Type:General  Level of Consciousness: awake, drowsy, and patient cooperative  Airway & Oxygen Therapy: Patient Spontanous Breathing and Patient connected to face mask oxygen  Post-op Assessment: Report given to RN and Post -op Vital signs reviewed and stable  Post vital signs: Reviewed and stable  Last Vitals:  Vitals Value Taken Time  BP 104/54 05/03/24 1424  Temp    Pulse 109 05/03/24 1426  Resp 24 05/03/24 1426  SpO2 100 % 05/03/24 1426  Vitals shown include unfiled device data.  Last Pain:  Vitals:   05/03/24 1343  TempSrc:   PainSc: 10-Worst pain ever         Complications: No notable events documented.

## 2024-05-03 NOTE — Op Note (Signed)
 [facility] Gastroenterology Patient Name: Abigail Molina Procedure Date: 05/03/2024 1:20 PM MRN: [id number] Account #: [id number] Date of Birth: 1966-06-12 Admit Type: Inpatient Age: 58 Room: [facility] ENDO ROOM 4 Gender: Female Note Status: Finalized Instrument Name: Upper Endoscope [id number] Procedure:             Upper GI endoscopy Indications:           Portal hypertension with UGI bleeding and suspected                         esophageal varices Providers:             [name] MD, MD Referring MD:          No Local Md, MD (Referring MD) Medicines:             General Anesthesia Complications:         No immediate complications. Procedure:             Pre-Anesthesia Assessment:                        - Prior to the procedure, a History and Physical was                         performed, and patient medications and allergies were                         reviewed. The patient's tolerance of previous                         anesthesia was also reviewed. The risks and benefits                         of the procedure and the sedation options and risks                         were discussed with the patient. All questions were                         answered, and informed consent was obtained. Prior                         Anticoagulants: The patient has taken no anticoagulant                         or antiplatelet agents. ASA Grade Assessment: III - A                         patient with severe systemic disease. After reviewing                         the risks and benefits, the patient was deemed in                         satisfactory condition to undergo the procedure.                        After obtaining informed consent, the endoscope was  passed under direct vision. Throughout the procedure,                         the patient's blood pressure, pulse, and oxygen                         saturations were monitored continuously. The Endoscope                          was introduced through the mouth, and advanced to the                         duodenal bulb. The upper GI endoscopy was accomplished                         without difficulty. The patient tolerated the                         procedure well. Findings:      Three esophageal ulcers oozing blood were found.      Hematin (altered blood/coffee-ground-like material) was found in the       entire examined stomach.      The examined duodenum was normal. Impression:            - Esophageal ulcers oozing blood from under the ulcers                         from previous banding.                        - Hematin (altered blood/coffee-ground-like material)                         in the entire stomach.                        - Normal examined duodenum.                        - No specimens collected. Recommendation:        - Return patient to ICU for ongoing care.                        - NPO.                        - Continue present medications. Procedure Code(s):     --- Professional ---                        [phone], Esophagogastroduodenoscopy, flexible,                         transoral; diagnostic, including collection of                         specimen(s) by brushing or washing, when performed                         (separate procedure) Diagnosis Code(s):     --- Professional ---  K76.6, Portal hypertension                        K92.2, Gastrointestinal hemorrhage, unspecified                        K22.11, Ulcer of esophagus with bleeding CPT copyright 2022 American Medical Association. All rights reserved. The codes documented in this report are preliminary and upon coder review may  be revised to meet current compliance requirements. [name] MD, MD 05/03/2024 2:10:31 PM This report has been signed electronically. Number of Addenda: 0 Note Initiated On: 05/03/2024 1:20 PM Estimated Blood Loss:  Estimated blood loss: none.      [facility]

## 2024-05-03 NOTE — H&P (Signed)
 History and Physical    Abigail Molina ZOX:[id number] DOB: 1966-02-10 DOA: 05/03/2024  PCP: [facility], [facility] [facility] (Confirm with patient/family/NH records and if not entered, this has to be entered at [facility] point of entry) Patient coming from: Home  I have personally briefly reviewed patient's old medical records in [facility]  Chief Complaint: Throwing up blood  HPI: Abigail Molina is a 58 y.o. female with medical history significant of NASH and liver cirrhosis with ascites, HTN, HLD, IIDM, history of seizure-like activities, presented with hematemesis.  Patient underwent EGD 6 days ago, outpatient treatment with banding of the esophageal varices.  Patient went home without any event.  This morning, patient woke up and vomited up large amount of bright red blood with streak of blood clots, subsequently had another 3-4 times of moderate amount of hematemesis and started to feel lightheaded.  No chest pains or shortness of breath.  Denied any abdominal pain, no black tarry stool.  ED Course: Sinus tachycardia, blood pressure 96/54, O2 saturation 100% on room air.  Hemoglobin 7.7 compared to baseline 8.5-8.6 of last week, BUN 21 creatinine 1.0 glucose 353.  Review of Systems: As per HPI otherwise 14 point review of systems negative.    Past Medical History:  Diagnosis Date   Anemia    Anxiety    Arthritis    Cancer (HCC)    Endometrial cancer   Chronic pain    Cirrhosis of liver (HCC)    Depression    Diabetes mellitus without complication (HCC)    Fatty liver disease, nonalcoholic 2021   GERD (gastroesophageal reflux disease)    Glaucoma    Hyperlipidemia    Hypertension    Meningioma (HCC)    NSTEMI (non-ST elevated myocardial infarction) (HCC) 02/03/2024   OSA (obstructive sleep apnea) 05/08/2023   Overactive bladder    Pharmacologic therapy 11/02/2023   Psoriasis 2015    Past Surgical History:  Procedure Laterality Date   ABDOMINAL HYSTERECTOMY  2007   CHOLECYSTECTOMY  2000    ESOPHAGEAL BANDING  04/28/2024   Procedure: ESOPHAGOSCOPY, WITH ESOPHAGEAL VARICES BAND LIGATION;  Surgeon: [name], MD;  Location: [facility] ENDOSCOPY;  Service: Endoscopy;;   ESOPHAGOGASTRODUODENOSCOPY N/A 03/22/2024   Procedure: EGD (ESOPHAGOGASTRODUODENOSCOPY);  Surgeon: [name], MD;  Location: [facility] ENDOSCOPY;  Service: Endoscopy;  Laterality: N/A;   ESOPHAGOGASTRODUODENOSCOPY N/A 04/28/2024   Procedure: EGD (ESOPHAGOGASTRODUODENOSCOPY);  Surgeon: [name], MD;  Location: [facility] ENDOSCOPY;  Service: Endoscopy;  Laterality: N/A;     reports that she has never smoked. She has never used smokeless tobacco. She reports that she does not currently use alcohol . She reports that she does not use drugs.  No Known Allergies  Family History  Problem Relation Age of Onset   Diabetes Mother    Hypertension Mother    Depression Mother    Obesity Mother    Vision loss Mother    Alcohol  abuse Cousin    Bipolar disorder Cousin    Heart disease Maternal Grandfather    Alcohol  abuse Maternal Grandmother    Stroke Paternal Grandfather      Prior to Admission medications   Medication Sig Start Date End Date Taking? Authorizing Provider  aspirin  81 MG tablet Take 81 mg by mouth daily.   Yes [provider]  Baclofen  5 MG TABS Take 2 tablets by mouth at bedtime.   Yes [provider]  Bismuth  Subsalicylate 525 MG/15ML SUSP Take 15 mLs (525 mg total) by mouth every 4 (four) hours  as needed for diarrhea or loose stools 04/22/24  Yes Lorita Rosa, MD  buPROPion  (WELLBUTRIN  XL) 150 MG 24 hr tablet Take 1 tablet (150 mg total) by mouth daily. Patient taking differently: Take 150 mg by mouth every evening. 11/16/23 05/03/24 Yes Hisada, Ivan Marion, MD  canagliflozin (INVOKANA) 300 MG TABS tablet Take 300 mg by mouth daily before breakfast.   Yes [provider]  COSENTYX SENSOREADY, 300 MG, 150 MG/ML SOAJ Inject into the skin.   Yes [provider]  insulin  lispro  (HUMALOG) 100 UNIT/ML injection Inject 8 Units into the skin 3 (three) times daily with meals.   Yes [provider]  lactulose  (CHRONULAC ) 10 GM/15ML solution Take 45 mLs (30 g total) by mouth 2 (two) times daily as needed for mild constipation or moderate constipation (titrate for 2-3 stools/day). 03/23/24  Yes Aisha Hove, MD  LANTUS  SOLOSTAR 100 UNIT/ML Solostar Pen Inject 30 Units into the skin daily. Patient taking differently: Inject 60 Units into the skin at bedtime. 03/23/24 05/24/25 Yes Aisha Hove, MD  levETIRAcetam  (KEPPRA ) 500 MG tablet Take 750 mg by mouth 2 (two) times daily.   Yes [provider]  MAGNESIUM -OXIDE 400 (240 Mg) MG tablet Take 2 tablets by mouth 2 (two) times daily. 10/14/23  Yes [provider]  metFORMIN  (GLUCOPHAGE -XR) 500 MG 24 hr tablet Take 1,000 mg by mouth 2 (two) times daily.   Yes [provider]  midodrine  (PROAMATINE ) 10 MG tablet Take 1 tablet (10 mg total) by mouth 3 (three) times daily with meals. 03/23/24  Yes Aisha Hove, MD  mirtazapine  (REMERON ) 15 MG tablet Take 1 tablet (15 mg total) by mouth at bedtime. 04/08/24 05/08/24 Yes Hisada, Ivan Marion, MD  naltrexone (DEPADE) 50 MG tablet Take 50 mg by mouth at bedtime.   Yes [provider]  ondansetron  (ZOFRAN -ODT) 4 MG disintegrating tablet Dissolve 1 tablet (4 mg total) in the mouth every twelve (12) hours as needed for nausea. 10/05/23 10/04/24 Yes [provider]  oxyCODONE  (OXY IR/ROXICODONE ) 5 MG immediate release tablet Take 1 tablet (5 mg total) by mouth every 6 (six) hours as needed for moderate pain (pain score 4-6). 04/22/24  Yes Lorita Rosa, MD  pantoprazole  (PROTONIX ) 40 MG tablet TAKE ONE TABLET BY MOUTH EVERY DAY 10/24/19  Yes McGowan, Cathleen Coach A, PA-C  promethazine (PHENERGAN) 12.5 MG tablet Take 12.5 mg by mouth every 4 (four) hours as needed for nausea or vomiting. 05/23/22  Yes [provider]  scopolamine  (TRANSDERM-SCOP) 1 MG/3DAYS Place 1 patch onto the skin every 3 (three) days. 04/11/24 04/11/25 Yes [provider]  spironolactone  (ALDACTONE ) 50 MG tablet Take 1 tablet (50 mg total) by mouth 2 (two) times daily. 03/23/24 06/21/24 Yes Aisha Hove, MD  thiamine  (VITAMIN B-1) 100 MG tablet Take 1 tablet (100 mg total) by mouth daily. 03/23/24  Yes Aisha Hove, MD  XIFAXAN  550 MG TABS tablet Take 550 mg by mouth 2 (two) times daily.   Yes [provider]  Zinc  Sulfate 220 (50 Zn) MG TABS Take 1 tablet (220 mg total) by mouth daily. 03/23/24  Yes Aisha Hove, MD  zolpidem  (AMBIEN ) 5 MG tablet Take 5 mg by mouth at bedtime as needed.   Yes [provider]  Accu-Chek Softclix Lancets lancets SMARTSIG:Topical    [provider]  BD PEN NEEDLE NANO 2ND GEN 32G X 4 MM MISC USE 1 NIGHTLY 09/28/23   [provider]  citalopram  (CELEXA ) 20 MG tablet Take 20 mg by  mouth at bedtime. Patient not taking: Reported on 05/03/2024    [provider]  Continuous Glucose Sensor (FREESTYLE LIBRE 3 SENSOR) MISC by Does not apply route daily.    [provider]  Ferrous Sulfate  (SLOW FE) 142 (45 Fe) MG TBCR Take 1 tablet by mouth daily. Patient not taking: Reported on 05/03/2024 09/12/21   [provider]  furosemide  (LASIX ) 20 MG tablet Take 1 tablet (20 mg total) by mouth 2 (two) times daily. Patient not taking: Reported on 05/03/2024 03/23/24   Aisha Hove, MD  gabapentin  (NEURONTIN ) 300 MG capsule Take 300 mg by mouth 3 (three) times daily. Patient not taking: Reported on 05/03/2024 06/02/23 06/01/24  [provider]  Insulin  Pen Needle 32G X 6 MM MISC 1 Syringe by Does not apply route. Use with Victoza .    [provider]  rosuvastatin  (CRESTOR ) 20 MG tablet Take 1 tablet (20 mg total) by mouth daily. Patient not taking: Reported on 05/03/2024 02/09/24   Donaciano Frizzle, MD  Sharps Container (BD SHARPS  COLLECTOR) MISC Use as directed to dispose of Cosentyx pens. 10/05/23   [provider]    Physical Exam: Vitals:   05/03/24 0952 05/03/24 1130 05/03/24 1241 05/03/24 1300  BP:  (!) 119/58 (!) 96/57 (!) 107/49  Pulse:  (!) 106 (!) 110 (!) 113  Resp:  20 20 18   Temp:   98.4 F (36.9 C)   TempSrc:   Oral   SpO2:  99% 100% 100%  Weight: 87.1 kg  86.8 kg   Height: 5\' 4"  (1.626 m)  5\' 4"  (1.626 m)     Constitutional: NAD, calm, comfortable Vitals:   05/03/24 0952 05/03/24 1130 05/03/24 1241 05/03/24 1300  BP:  (!) 119/58 (!) 96/57 (!) 107/49  Pulse:  (!) 106 (!) 110 (!) 113  Resp:  20 20 18   Temp:   98.4 F (36.9 C)   TempSrc:   Oral   SpO2:  99% 100% 100%  Weight: 87.1 kg  86.8 kg   Height: 5\' 4"  (1.626 m)  5\' 4"  (1.626 m)    Eyes: PERRL, lids and conjunctivae normal ENMT: Mucous membranes are moist. Posterior pharynx clear of any exudate or lesions.Normal dentition.  Neck: normal, supple, no masses, no thyromegaly Respiratory: clear to auscultation bilaterally, no wheezing, no crackles. Normal respiratory effort. No accessory muscle use.  Cardiovascular: Regular rate and rhythm, no murmurs / rubs / gallops. No extremity edema. 2+ pedal pulses. No carotid bruits.  Abdomen: no tenderness, no masses palpated. No hepatosplenomegaly. Bowel sounds positive.  Musculoskeletal: no clubbing / cyanosis. No joint deformity upper and lower extremities. Good ROM, no contractures. Normal muscle tone.  Skin: no rashes, lesions, ulcers. No induration Neurologic: CN 2-12 grossly intact. Sensation intact, DTR normal. Strength 5/5 in all 4.  Psychiatric: Normal judgment and insight. Alert and oriented x 3. Normal mood.     Labs on Admission: I have personally reviewed following labs and imaging studies  CBC: Recent Labs  Lab 05/03/24 0949  WBC 5.1  NEUTROABS 3.6  HGB 7.7*  HCT 25.6*  MCV 75.1*  PLT 209   Basic Metabolic Panel: Recent Labs  Lab 05/03/24 0949  NA 130*  K  4.8  CL 95*  CO2 21*  GLUCOSE 353*  BUN 21*  CREATININE 1.05*  CALCIUM  8.5*  MG 2.0   GFR: Estimated Creatinine Clearance: 61.5 mL/min (A) (by C-G formula based on SCr of 1.05 mg/dL (H)). Liver Function Tests: Recent Labs  Lab  05/03/24 0949  AST 57*  ALT 23  ALKPHOS 228*  BILITOT 4.8*  PROT 6.7  ALBUMIN  2.0*   Recent Labs  Lab 05/03/24 0949  LIPASE 91*   No results for input(s): "AMMONIA" in the last 168 hours. Coagulation Profile: Recent Labs  Lab 05/03/24 0949  INR 1.5*   Cardiac Enzymes: No results for input(s): "CKTOTAL", "CKMB", "CKMBINDEX", "TROPONINI" in the last 168 hours. BNP (last 3 results) No results for input(s): "PROBNP" in the last 8760 hours. HbA1C: No results for input(s): "HGBA1C" in the last 72 hours. CBG: Recent Labs  Lab 04/28/24 0834 05/03/24 1254  GLUCAP 213* 328*   Lipid Profile: No results for input(s): "CHOL", "HDL", "LDLCALC", "TRIG", "CHOLHDL", "LDLDIRECT" in the last 72 hours. Thyroid Function Tests: No results for input(s): "TSH", "T4TOTAL", "FREET4", "T3FREE", "THYROIDAB" in the last 72 hours. Anemia Panel: No results for input(s): "VITAMINB12", "FOLATE", "FERRITIN", "TIBC", "IRON", "RETICCTPCT" in the last 72 hours. Urine analysis:    Component Value Date/Time   COLORURINE YELLOW (A) 04/18/2024 1732   APPEARANCEUR HAZY (A) 04/18/2024 1732   APPEARANCEUR Clear 03/12/2016 1224   LABSPEC 1.009 04/18/2024 1732   LABSPEC 1.025 11/09/2012 0940   PHURINE 5.0 04/18/2024 1732   GLUCOSEU >=500 (A) 04/18/2024 1732   GLUCOSEU Negative 11/09/2012 0940   HGBUR SMALL (A) 04/18/2024 1732   BILIRUBINUR NEGATIVE 04/18/2024 1732   BILIRUBINUR Negative 03/12/2016 1224   BILIRUBINUR Negative 11/09/2012 0940   KETONESUR NEGATIVE 04/18/2024 1732   PROTEINUR NEGATIVE 04/18/2024 1732   NITRITE NEGATIVE 04/18/2024 1732   LEUKOCYTESUR LARGE (A) 04/18/2024 1732   LEUKOCYTESUR Trace 11/09/2012 0940    Radiological Exams on Admission: No  results found.  EKG: Independently reviewed.  Sinus tachycardia, no acute ST changes.  Assessment/Plan Principal Problem:   GI bleed  (please populate well all problems here in Problem List. (For example, if patient is on BP meds at home and you resume or decide to hold them, it is a problem that needs to be her. Same for CAD, COPD, HLD and so on)  Hematemesis Acute on chronic blood loss anemia - Secondary to likely recurrent esophageal varices bleeding - Admitted to stepdown unit - Agreed with Sandostatin  drip and PPI twice daily - Ceftriaxone  for SBP prophylaxis - GI plans to take patient to the OR for emergency EGD this afternoon. - PRBC x 1 and repeat H&H this afternoon tonight, consider further transfusion for hemodynamic instability - Correct coagulopathy with 1 dose of subcu vitamin K -INR=1.5, will hold off FFP.  NASH and cirrhosis with portal hypertension - Appears to be compensated, with mild coagulopathy - Mentation at baseline and physical exam showed left hip side-to-side - Continue as lactulose , rifaximin  - Hold of Lasix  and spironolactone   IIDM with hyperglycemia - SSI  History of seizure like  - No acute concern, change p.o. Keppra  to IV Keppra   History of orthostatic hypotension - Continue midodrine   DVT prophylaxis: SCD Code Status: Full code Family Communication: None at bedside Disposition Plan: Patient is sick with esophageal varices bleeding and acute anemia requiring transfusion and emergency EGD, expect more than 2 midnight hospital stay Consults called: GI Admission status: Stepdown Admission   [place] MD [facility] Pager [phone]  05/03/2024, 1:34 PM

## 2024-05-03 NOTE — ED Notes (Signed)
Informed RN bed assigned 

## 2024-05-03 NOTE — Procedures (Signed)
 Central Venous Catheter Insertion Procedure Note  JAMALA KOHEN  [id number]  1966/11/10  Date:05/03/24  Time:9:18 PM   Provider Performing:Tristyn Pharris L [place]   Procedure: Insertion of Non-tunneled Central Venous [phone]) with US  guidance ([id number])   Indication(s) Medication administration  Consent Risks of the procedure as well as the alternatives and risks of each were explained to the patient and/or caregiver.  Consent for the procedure was obtained and is signed in the bedside chart  Anesthesia Topical only with 1% lidocaine   & fentanyl  IVP  Timeout Verified patient identification, verified procedure, site/side was marked, verified correct patient position, special equipment/implants available, medications/allergies/relevant history reviewed, required imaging and test results available.  Sterile Technique Maximal sterile technique including full sterile barrier drape, hand hygiene, sterile gown, sterile gloves, mask, hair covering, sterile ultrasound probe cover (if used).  Procedure Description Area of catheter insertion was cleaned with chlorhexidine  and draped in sterile fashion.  With real-time ultrasound guidance a central venous catheter was placed into the right femoral vein. Nonpulsatile blood flow and easy flushing noted in all ports.  The catheter was sutured in place and sterile dressing applied.  Complications/Tolerance None; patient tolerated the procedure well. Chest X-ray is ordered to verify placement for internal jugular or subclavian cannulation.   Chest x-ray is not ordered for femoral cannulation.  EBL Minimal  Specimen(s) None   Eliott Guess, AGACNP-BC Acute Care Nurse Practitioner [facility]   [phone] / [phone] Please see Amion for details.

## 2024-05-03 NOTE — Anesthesia Postprocedure Evaluation (Signed)
 Anesthesia Post Note  Patient: [name]  Procedure(s) Performed: EGD (ESOPHAGOGASTRODUODENOSCOPY)  Patient location during evaluation: Endoscopy Anesthesia Type: General Level of consciousness: awake and alert Pain management: pain level controlled Vital Signs Assessment: vitals unstable Respiratory status: spontaneous breathing, nonlabored ventilation and respiratory function stable Cardiovascular status: tachycardic and unstable : pt in no distress but states she is nauseous. Anesthetic complications: no Comments: I was called to pacu for post-procedure hypotension. The chart was reviewed and noted that blood was found during the EGD, her pre-op systolic blood pressure was 90's and HR was tachycardic. The differential was hypovolemia from prior bleeding episode or current acute blood loss with hypovolemic shock. I started a bottle of 5% albumin  and discussed the with endo pacu nurse that I do have concerns for acute blood loss and that she needs close monitoring and a repeat CBC. It was decided that the ICU would be made aware and assume control of resuscitation and work-up.    No notable events documented.   Last Vitals:  Vitals:   05/03/24 2105 05/03/24 2110  BP:  (!) 75/37  Pulse: (!) 122 (!) 122  Resp: (!) 21 (!) 23  Temp:    SpO2: 100% 99%    Last Pain:  Vitals:   05/03/24 2000  TempSrc: Oral  PainSc:                  Baltazar Bonier

## 2024-05-03 NOTE — Plan of Care (Signed)
  Problem: Education: Goal: Ability to describe self-care measures that may prevent or decrease complications (Diabetes Survival Skills Education) will improve Outcome: Progressing Goal: Individualized Educational Video(s) Outcome: Progressing   Problem: Coping: Goal: Ability to adjust to condition or change in health will improve Outcome: Progressing   Problem: Health Behavior/Discharge Planning: Goal: Ability to identify and utilize available resources and services will improve Outcome: Progressing   Problem: Metabolic: Goal: Ability to maintain appropriate glucose levels will improve Outcome: Progressing   Problem: Skin Integrity: Goal: Risk for impaired skin integrity will decrease Outcome: Progressing   Problem: Education: Goal: Knowledge of General Education information will improve Description: Including pain rating scale, medication(s)/side effects and non-pharmacologic comfort measures Outcome: Progressing   Problem: Health Behavior/Discharge Planning: Goal: Ability to manage health-related needs will improve Outcome: Progressing

## 2024-05-03 NOTE — Progress Notes (Signed)
 eLink Physician-Brief Progress Note Patient Name: Abigail Molina DOB: September 24, 1966 MRN: [id number]   Date of Service  05/03/2024  HPI/Events of Note  58 y.o. female with medical history significant of NASH and liver cirrhosis with ascites, HTN, HLD, IIDM, history of seizure-like activities, presented with hematemesis.   On examination she is tachypneic, tachycardic, and hypotensive saturating 100% on room air.  Melena norepinephrine  infusion, octreotide  infusion, status post albumin .  Results show mild  hyperkalemia, hemoglobin 6.9 which is down trended.  Leukocytosis present  eICU Interventions  Transfuse as needed for hemoglobin between 7-9.  Maintain octreotide  and PPI.  Prophylactic ceftriaxone  (can reduce dose to 1 g)  Maintain vasopressors as needed to maintain MAP greater than 65.  DVT prophylaxis with SCDs GI prophylaxis with therapeutic PPI     Intervention Category Evaluation Type: New Patient Evaluation  [name] 05/03/2024, 10:00 PM

## 2024-05-03 NOTE — Progress Notes (Signed)
 Pts bp significantly dropped as well as patient started vomiting blood and clots, MD notified, ICU consulted for levophed  gtt, 1U prbc hung, prn antiemetic given, patient then projectile vomited blood and clots, GI MD made aware as well.

## 2024-05-04 ENCOUNTER — Other Ambulatory Visit: Payer: Self-pay | Admitting: Interventional Radiology

## 2024-05-04 ENCOUNTER — Inpatient Hospital Stay

## 2024-05-04 ENCOUNTER — Ambulatory Visit: Admit: 2024-05-04 | Payer: Medicaid (Managed Care)

## 2024-05-04 ENCOUNTER — Encounter: Admit: 2024-05-04 | Payer: Medicaid (Managed Care)

## 2024-05-04 ENCOUNTER — Inpatient Hospital Stay
Admit: 2024-05-04 | Discharge: 2024-05-31 | Disposition: E | Payer: Medicaid (Managed Care) | Source: Other Acute Inpatient Hospital | Admitting: Pulmonary Disease

## 2024-05-04 ENCOUNTER — Encounter
Admit: 2024-05-04 | Discharge: 2024-05-31 | Disposition: E | Payer: Medicaid (Managed Care) | Source: Other Acute Inpatient Hospital | Admitting: Pulmonary Disease

## 2024-05-04 ENCOUNTER — Ambulatory Visit
Admit: 2024-05-04 | Discharge: 2024-05-31 | Disposition: E | Payer: Medicaid (Managed Care) | Source: Other Acute Inpatient Hospital | Admitting: Pulmonary Disease

## 2024-05-04 DIAGNOSIS — I8511 Secondary esophageal varices with bleeding: Secondary | ICD-10-CM

## 2024-05-04 DIAGNOSIS — D62 Acute posthemorrhagic anemia: Secondary | ICD-10-CM

## 2024-05-04 DIAGNOSIS — E119 Type 2 diabetes mellitus without complications: Secondary | ICD-10-CM

## 2024-05-04 DIAGNOSIS — R7989 Other specified abnormal findings of blood chemistry: Secondary | ICD-10-CM

## 2024-05-04 DIAGNOSIS — E875 Hyperkalemia: Secondary | ICD-10-CM

## 2024-05-04 DIAGNOSIS — N179 Acute kidney failure, unspecified: Secondary | ICD-10-CM

## 2024-05-04 DIAGNOSIS — K7581 Nonalcoholic steatohepatitis (NASH): Secondary | ICD-10-CM

## 2024-05-04 DIAGNOSIS — E871 Hypo-osmolality and hyponatremia: Secondary | ICD-10-CM

## 2024-05-04 DIAGNOSIS — K7460 Unspecified cirrhosis of liver: Secondary | ICD-10-CM

## 2024-05-04 HISTORY — PX: IR PARACENTESIS: IMG2679

## 2024-05-04 HISTORY — PX: IR TIPS: IMG2295

## 2024-05-04 LAB — BLOOD GAS, ARTERIAL
Acid-base deficit: 10.4 mmol/L — ABNORMAL HIGH (ref 0.0–2.0)
Bicarbonate: 17.9 mmol/L — ABNORMAL LOW (ref 20.0–28.0)
FIO2: 100 %
MECHVT: 400 mL
Mechanical Rate: 16
O2 Saturation: 94.4 %
PEEP: 5 cmH2O
Patient temperature: 37
pCO2 arterial: 48 mmHg (ref 32–48)
pH, Arterial: 7.18 — CL (ref 7.35–7.45)
pO2, Arterial: 76 mmHg — ABNORMAL LOW (ref 83–108)

## 2024-05-04 LAB — TROPONIN I (HIGH SENSITIVITY): Troponin I (High Sensitivity): 861 ng/L (ref <18)

## 2024-05-04 LAB — GLUCOSE, CAPILLARY: Glucose-Capillary: 325 mg/dL — ABNORMAL HIGH (ref 70–99)

## 2024-05-04 LAB — BLOOD GAS, VENOUS
Bicarbonate: 20.7 mmol/L — ABNORMAL HIGH (ref 20.0–28.0)
Patient temperature: 37 mmol/L — ABNORMAL HIGH (ref 0.0–2.0)
pCO2, Ven: 35 mmHg — ABNORMAL LOW (ref 44–60)
pH, Ven: 7.38 (ref 7.25–7.43)
pO2, Ven: 20.7 mmol/L (ref 32–45)

## 2024-05-04 LAB — LACTIC ACID, PLASMA: Lactic Acid, Venous: 8.4 mmol/L (ref 0.5–1.9)

## 2024-05-04 LAB — BASIC METABOLIC PANEL WITH GFR
Anion gap: 17 — ABNORMAL HIGH (ref 5–15)
BUN: 38 mg/dL — ABNORMAL HIGH (ref 6–20)
CO2: 17 mmol/L — ABNORMAL LOW (ref 22–32)
Calcium: 9 mg/dL (ref 8.9–10.3)
Chloride: 100 mmol/L (ref 98–111)
Creatinine, Ser: 1.14 mg/dL — ABNORMAL HIGH (ref 0.44–1.00)
GFR, Estimated: 55 mL/min — ABNORMAL LOW (ref >60)
Glucose, Bld: 352 mg/dL — ABNORMAL HIGH (ref 70–99)
Potassium: 5.2 mmol/L — ABNORMAL HIGH (ref 3.5–5.1)
Sodium: 134 mmol/L — ABNORMAL LOW (ref 135–145)

## 2024-05-04 LAB — CBC
HCT: 30.5 % — ABNORMAL LOW (ref 36.0–46.0)
Hemoglobin: 9.7 g/dL — ABNORMAL LOW (ref 12.0–15.0)
MCH: 26.2 pg (ref 26.0–34.0)
MCHC: 31.8 g/dL (ref 30.0–36.0)
MCV: 82.4 fL (ref 80.0–100.0)
Platelets: 285 10*3/uL (ref 150–400)
RBC: 3.7 MIL/uL — ABNORMAL LOW (ref 3.87–5.11)
RDW: 18.1 % — ABNORMAL HIGH (ref 11.5–15.5)
WBC: 27.2 10*3/uL — ABNORMAL HIGH (ref 4.0–10.5)
nRBC: 0 % (ref 0.0–0.2)

## 2024-05-04 LAB — PROTIME-INR
INR: 1.6 — ABNORMAL HIGH (ref 0.8–1.2)
INR: 1.46
INR: 1.9
PROTIME: 16.6 s — ABNORMAL HIGH (ref 9.9–12.6)
PROTIME: 21.7 s — ABNORMAL HIGH (ref 9.9–12.6)
Prothrombin Time: 19 s — ABNORMAL HIGH (ref 11.4–15.2)

## 2024-05-04 LAB — HEPATIC FUNCTION PANEL
ALT: 29 U/L (ref 0–44)
AST: 87 U/L — ABNORMAL HIGH (ref 15–41)
Albumin: 2.8 g/dL — ABNORMAL LOW (ref 3.5–5.0)
Alkaline Phosphatase: 177 U/L — ABNORMAL HIGH (ref 38–126)
Bilirubin, Direct: 5.2 mg/dL — ABNORMAL HIGH (ref 0.0–0.2)
Indirect Bilirubin: 3.7 mg/dL — ABNORMAL HIGH (ref 0.3–0.9)
Total Bilirubin: 8.7 mg/dL — ABNORMAL HIGH (ref 0.0–1.2)
Total Protein: 6.8 g/dL (ref 6.5–8.1)

## 2024-05-04 LAB — MAGNESIUM
MAGNESIUM: 2 mg/dL (ref 1.6–2.6)
Magnesium: 2.6 mg/dL — ABNORMAL HIGH (ref 1.7–2.4)

## 2024-05-04 LAB — PREPARE RBC (CROSSMATCH)

## 2024-05-04 LAB — PHOSPHORUS: Phosphorus: 6.3 mg/dL — ABNORMAL HIGH (ref 2.5–4.6)

## 2024-05-04 LAB — CBC W/ AUTO DIFF
BASOPHILS ABSOLUTE COUNT: 0.1 10*9/L (ref 0.0–0.1)
BASOPHILS ABSOLUTE COUNT: 0 10*9/L (ref 0.0–0.1)
BASOPHILS ABSOLUTE COUNT: 0 10*9/L (ref 0.0–0.1)
BASOPHILS ABSOLUTE COUNT: 0 10*9/L (ref 0.0–0.1)
BASOPHILS RELATIVE PERCENT: 0.4 %
BASOPHILS RELATIVE PERCENT: 0.2 %
BASOPHILS RELATIVE PERCENT: 0.2 %
BASOPHILS RELATIVE PERCENT: 0.2 %
EOSINOPHILS ABSOLUTE COUNT: 0 10*9/L (ref 0.0–0.5)
EOSINOPHILS ABSOLUTE COUNT: 0 10*9/L (ref 0.0–0.5)
EOSINOPHILS ABSOLUTE COUNT: 0 10*9/L (ref 0.0–0.5)
EOSINOPHILS ABSOLUTE COUNT: 0 10*9/L (ref 0.0–0.5)
EOSINOPHILS RELATIVE PERCENT: 0.1 %
EOSINOPHILS RELATIVE PERCENT: 0 %
EOSINOPHILS RELATIVE PERCENT: 0.1 %
EOSINOPHILS RELATIVE PERCENT: 0.1 %
HEMATOCRIT: 28.1 % — ABNORMAL LOW (ref 34.0–44.0)
HEMATOCRIT: 27.8 % — ABNORMAL LOW (ref 34.0–44.0)
HEMATOCRIT: 19.8 % — ABNORMAL LOW (ref 34.0–44.0)
HEMATOCRIT: 25.1 % — ABNORMAL LOW (ref 34.0–44.0)
HEMOGLOBIN: 9.3 g/dL — ABNORMAL LOW (ref 11.3–14.9)
HEMOGLOBIN: 9 g/dL — ABNORMAL LOW (ref 11.3–14.9)
HEMOGLOBIN: 6.4 g/dL — ABNORMAL LOW (ref 11.3–14.9)
HEMOGLOBIN: 8.4 g/dL — ABNORMAL LOW (ref 11.3–14.9)
LYMPHOCYTES ABSOLUTE COUNT: 1 10*9/L — ABNORMAL LOW (ref 1.1–3.6)
LYMPHOCYTES ABSOLUTE COUNT: 1.4 10*9/L (ref 1.1–3.6)
LYMPHOCYTES ABSOLUTE COUNT: 1.5 10*9/L (ref 1.1–3.6)
LYMPHOCYTES ABSOLUTE COUNT: 1.1 10*9/L (ref 1.1–3.6)
LYMPHOCYTES RELATIVE PERCENT: 4.2 %
LYMPHOCYTES RELATIVE PERCENT: 5.8 %
LYMPHOCYTES RELATIVE PERCENT: 7.9 %
LYMPHOCYTES RELATIVE PERCENT: 5.2 %
MEAN CORPUSCULAR HEMOGLOBIN CONC: 33.2 g/dL (ref 32.0–36.0)
MEAN CORPUSCULAR HEMOGLOBIN CONC: 32.5 g/dL (ref 32.0–36.0)
MEAN CORPUSCULAR HEMOGLOBIN CONC: 32.3 g/dL (ref 32.0–36.0)
MEAN CORPUSCULAR HEMOGLOBIN CONC: 33.5 g/dL (ref 32.0–36.0)
MEAN CORPUSCULAR HEMOGLOBIN: 26.8 pg (ref 25.9–32.4)
MEAN CORPUSCULAR HEMOGLOBIN: 26 pg (ref 25.9–32.4)
MEAN CORPUSCULAR HEMOGLOBIN: 25.8 pg — ABNORMAL LOW (ref 25.9–32.4)
MEAN CORPUSCULAR HEMOGLOBIN: 27.6 pg (ref 25.9–32.4)
MEAN CORPUSCULAR VOLUME: 80.7 fL (ref 77.6–95.7)
MEAN CORPUSCULAR VOLUME: 80.1 fL (ref 77.6–95.7)
MEAN CORPUSCULAR VOLUME: 79.8 fL (ref 77.6–95.7)
MEAN CORPUSCULAR VOLUME: 82.6 fL (ref 77.6–95.7)
MEAN PLATELET VOLUME: 7.6 fL (ref 6.8–10.7)
MEAN PLATELET VOLUME: 7.4 fL (ref 6.8–10.7)
MEAN PLATELET VOLUME: 7.5 fL (ref 6.8–10.7)
MEAN PLATELET VOLUME: 7.8 fL (ref 6.8–10.7)
MONOCYTES ABSOLUTE COUNT: 3 10*9/L — ABNORMAL HIGH (ref 0.3–0.8)
MONOCYTES ABSOLUTE COUNT: 3.2 10*9/L — ABNORMAL HIGH (ref 0.3–0.8)
MONOCYTES ABSOLUTE COUNT: 2.4 10*9/L — ABNORMAL HIGH (ref 0.3–0.8)
MONOCYTES ABSOLUTE COUNT: 2.4 10*9/L — ABNORMAL HIGH (ref 0.3–0.8)
MONOCYTES RELATIVE PERCENT: 12.3 %
MONOCYTES RELATIVE PERCENT: 13.7 %
MONOCYTES RELATIVE PERCENT: 12.1 %
MONOCYTES RELATIVE PERCENT: 11.3 %
NEUTROPHILS ABSOLUTE COUNT: 20.7 10*9/L — ABNORMAL HIGH (ref 1.8–7.8)
NEUTROPHILS ABSOLUTE COUNT: 19 10*9/L — ABNORMAL HIGH (ref 1.8–7.8)
NEUTROPHILS ABSOLUTE COUNT: 15.7 10*9/L — ABNORMAL HIGH (ref 1.8–7.8)
NEUTROPHILS ABSOLUTE COUNT: 18.1 10*9/L — ABNORMAL HIGH (ref 1.8–7.8)
NEUTROPHILS RELATIVE PERCENT: 83 %
NEUTROPHILS RELATIVE PERCENT: 80.3 %
NEUTROPHILS RELATIVE PERCENT: 79.7 %
NEUTROPHILS RELATIVE PERCENT: 83.2 %
PLATELET COUNT: 253 10*9/L (ref 150–450)
PLATELET COUNT: 229 10*9/L (ref 150–450)
PLATELET COUNT: 215 10*9/L (ref 150–450)
PLATELET COUNT: 199 10*9/L (ref 150–450)
RED BLOOD CELL COUNT: 3.48 10*12/L — ABNORMAL LOW (ref 3.95–5.13)
RED BLOOD CELL COUNT: 3.47 10*12/L — ABNORMAL LOW (ref 3.95–5.13)
RED BLOOD CELL COUNT: 2.48 10*12/L — ABNORMAL LOW (ref 3.95–5.13)
RED BLOOD CELL COUNT: 3.05 10*12/L — ABNORMAL LOW (ref 3.95–5.13)
RED CELL DISTRIBUTION WIDTH: 19.6 % — ABNORMAL HIGH (ref 12.2–15.2)
RED CELL DISTRIBUTION WIDTH: 19.1 % — ABNORMAL HIGH (ref 12.2–15.2)
RED CELL DISTRIBUTION WIDTH: 19.7 % — ABNORMAL HIGH (ref 12.2–15.2)
RED CELL DISTRIBUTION WIDTH: 17.5 % — ABNORMAL HIGH (ref 12.2–15.2)
WBC ADJUSTED: 24.9 10*9/L — ABNORMAL HIGH (ref 3.6–11.2)
WBC ADJUSTED: 23.7 10*9/L — ABNORMAL HIGH (ref 3.6–11.2)
WBC ADJUSTED: 19.7 10*9/L — ABNORMAL HIGH (ref 3.6–11.2)
WBC ADJUSTED: 21.8 10*9/L — ABNORMAL HIGH (ref 3.6–11.2)

## 2024-05-04 LAB — URINALYSIS WITH MICROSCOPY
BACTERIA: NONE SEEN /HPF
BILIRUBIN UA: NEGATIVE
BILIRUBIN UA: NEGATIVE
BLOOD UA: NEGATIVE
BLOOD UA: NEGATIVE
GLUCOSE UA: >1000 — AB
GLUCOSE UA: 1000 — AB
HYALINE CASTS: 3 /LPF — ABNORMAL HIGH (ref 0–1)
HYALINE CASTS: 1 /LPF (ref 0–1)
KETONES UA: NEGATIVE
KETONES UA: NEGATIVE
LEUKOCYTE ESTERASE UA: NEGATIVE
LEUKOCYTE ESTERASE UA: NEGATIVE
NITRITE UA: NEGATIVE
NITRITE UA: NEGATIVE
PH UA: 5 (ref 5.0–9.0)
PH UA: 5.5 (ref 5.0–9.0)
PROTEIN UA: NEGATIVE
PROTEIN UA: NEGATIVE
RBC UA: 4 /HPF (ref <=4)
RBC UA: 2 /HPF (ref <=4)
SPECIFIC GRAVITY UA: 1.032 — ABNORMAL HIGH (ref 1.003–1.030)
SPECIFIC GRAVITY UA: 1.032 — ABNORMAL HIGH (ref 1.003–1.030)
SQUAMOUS EPITHELIAL: 2 /HPF (ref 0–5)
SQUAMOUS EPITHELIAL: 1 /HPF (ref 0–5)
UROBILINOGEN UA: <2
UROBILINOGEN UA: <2
WBC UA: 1 /HPF (ref 0–5)
WBC UA: 1 /HPF (ref 0–5)

## 2024-05-04 LAB — BLOOD GAS CRITICAL CARE PANEL, ARTERIAL
BASE EXCESS ARTERIAL: -6.1 — ABNORMAL LOW (ref -2.0–2.0)
BASE EXCESS ARTERIAL: -11.3 — ABNORMAL LOW (ref -2.0–2.0)
BASE EXCESS ARTERIAL: -10.7 — ABNORMAL LOW (ref -2.0–2.0)
BASE EXCESS ARTERIAL: -8.5 — ABNORMAL LOW (ref -2.0–2.0)
CALCIUM IONIZED ARTERIAL (MG/DL): 4.67 mg/dL (ref 4.40–5.40)
CALCIUM IONIZED ARTERIAL (MG/DL): 4.22 mg/dL — ABNORMAL LOW (ref 4.40–5.40)
CALCIUM IONIZED ARTERIAL (MG/DL): 4.23 mg/dL — ABNORMAL LOW (ref 4.40–5.40)
CALCIUM IONIZED ARTERIAL (MG/DL): 4.54 mg/dL (ref 4.40–5.40)
FIO2 ARTERIAL: 60
FIO2 ARTERIAL: 55
FIO2 ARTERIAL: 55
GLUCOSE WHOLE BLOOD: 363 mg/dL — ABNORMAL HIGH (ref 70–179)
GLUCOSE WHOLE BLOOD: 287 mg/dL — ABNORMAL HIGH (ref 70–179)
GLUCOSE WHOLE BLOOD: 240 mg/dL — ABNORMAL HIGH (ref 70–179)
GLUCOSE WHOLE BLOOD: 214 mg/dL — ABNORMAL HIGH (ref 70–179)
HCO3 ARTERIAL: 18 mmol/L — ABNORMAL LOW (ref 22–27)
HCO3 ARTERIAL: 14 mmol/L — ABNORMAL LOW (ref 22–27)
HCO3 ARTERIAL: 16 mmol/L — ABNORMAL LOW (ref 22–27)
HCO3 ARTERIAL: 17 mmol/L — ABNORMAL LOW (ref 22–27)
HEMOGLOBIN BLOOD GAS: 9 g/dL — ABNORMAL LOW (ref 12.00–16.00)
HEMOGLOBIN BLOOD GAS: 12.4 g/dL (ref 12.00–16.00)
HEMOGLOBIN BLOOD GAS: 11.4 g/dL — ABNORMAL LOW (ref 12.00–16.00)
HEMOGLOBIN BLOOD GAS: 9.6 g/dL — ABNORMAL LOW (ref 12.00–16.00)
LACTATE BLOOD ARTERIAL: 9.6 mmol/L (ref <1.3)
LACTATE BLOOD ARTERIAL: 11 mmol/L (ref <1.3)
LACTATE BLOOD ARTERIAL: 9.9 mmol/L (ref <1.3)
LACTATE BLOOD ARTERIAL: 8.3 mmol/L (ref <1.3)
O2 SATURATION ARTERIAL: 97.1 % (ref 94.0–100.0)
O2 SATURATION ARTERIAL: 97.9 % (ref 94.0–100.0)
O2 SATURATION ARTERIAL: 94.6 % (ref 94.0–100.0)
O2 SATURATION ARTERIAL: 97.5 % (ref 94.0–100.0)
PCO2 ARTERIAL: 32.7 mmHg — ABNORMAL LOW (ref 35.0–45.0)
PCO2 ARTERIAL: 32.3 mmHg — ABNORMAL LOW (ref 35.0–45.0)
PCO2 ARTERIAL: 44.4 mmHg (ref 35.0–45.0)
PCO2 ARTERIAL: 37.3 mmHg (ref 35.0–45.0)
PH ARTERIAL: 7.36 (ref 7.35–7.45)
PH ARTERIAL: 7.27 — ABNORMAL LOW (ref 7.35–7.45)
PH ARTERIAL: 7.18 — CL (ref 7.35–7.45)
PH ARTERIAL: 7.28 — ABNORMAL LOW (ref 7.35–7.45)
PO2 ARTERIAL: 90.6 mmHg (ref 80.0–110.0)
PO2 ARTERIAL: 106 mmHg (ref 80.0–110.0)
PO2 ARTERIAL: 81 mmHg (ref 80.0–110.0)
PO2 ARTERIAL: 92.9 mmHg (ref 80.0–110.0)
POTASSIUM WHOLE BLOOD: 4.7 mmol/L — ABNORMAL HIGH (ref 3.4–4.6)
POTASSIUM WHOLE BLOOD: 5.2 mmol/L — ABNORMAL HIGH (ref 3.4–4.6)
POTASSIUM WHOLE BLOOD: 4.7 mmol/L — ABNORMAL HIGH (ref 3.4–4.6)
POTASSIUM WHOLE BLOOD: 4.3 mmol/L (ref 3.4–4.6)
SODIUM WHOLE BLOOD: 134 mmol/L — ABNORMAL LOW (ref 135–145)
SODIUM WHOLE BLOOD: 137 mmol/L (ref 135–145)
SODIUM WHOLE BLOOD: 141 mmol/L (ref 135–145)
SODIUM WHOLE BLOOD: 141 mmol/L (ref 135–145)

## 2024-05-04 LAB — COMPREHENSIVE METABOLIC PANEL
ALBUMIN: 2.6 g/dL — ABNORMAL LOW (ref 3.4–5.0)
ALBUMIN: 2.5 g/dL — ABNORMAL LOW (ref 3.4–5.0)
ALKALINE PHOSPHATASE: 208 U/L — ABNORMAL HIGH (ref 46–116)
ALKALINE PHOSPHATASE: 193 U/L — ABNORMAL HIGH (ref 46–116)
ALT (SGPT): 28 U/L (ref 10–49)
ALT (SGPT): 27 U/L (ref 10–49)
ANION GAP: 23 mmol/L — ABNORMAL HIGH (ref 5–14)
ANION GAP: 17 mmol/L — ABNORMAL HIGH (ref 5–14)
AST (SGOT): 83 U/L — ABNORMAL HIGH (ref <=34)
AST (SGOT): 81 U/L — ABNORMAL HIGH (ref <=34)
BILIRUBIN TOTAL: 7.4 mg/dL — ABNORMAL HIGH (ref 0.3–1.2)
BILIRUBIN TOTAL: 7.3 mg/dL — ABNORMAL HIGH (ref 0.3–1.2)
BLOOD UREA NITROGEN: 35 mg/dL — ABNORMAL HIGH (ref 9–23)
BLOOD UREA NITROGEN: 38 mg/dL — ABNORMAL HIGH (ref 9–23)
BUN / CREAT RATIO: 34
BUN / CREAT RATIO: 37
CALCIUM: 9 mg/dL (ref 8.7–10.4)
CALCIUM: 8.8 mg/dL (ref 8.7–10.4)
CHLORIDE: 100 mmol/L (ref 98–107)
CHLORIDE: 101 mmol/L (ref 98–107)
CO2: 15 mmol/L — ABNORMAL LOW (ref 20.0–31.0)
CO2: 18 mmol/L — ABNORMAL LOW (ref 20.0–31.0)
CREATININE: 1.04 mg/dL — ABNORMAL HIGH (ref 0.55–1.02)
CREATININE: 1.02 mg/dL (ref 0.55–1.02)
EGFR CKD-EPI (2021) FEMALE: 62 mL/min/1.73m2 (ref >=60)
EGFR CKD-EPI (2021) FEMALE: 64 mL/min/1.73m2 (ref >=60)
GLUCOSE RANDOM: 359 mg/dL — ABNORMAL HIGH (ref 70–179)
GLUCOSE RANDOM: 370 mg/dL — ABNORMAL HIGH (ref 70–179)
POTASSIUM: 5.2 mmol/L — ABNORMAL HIGH (ref 3.4–4.8)
POTASSIUM: 4.9 mmol/L — ABNORMAL HIGH (ref 3.4–4.8)
PROTEIN TOTAL: 6.4 g/dL (ref 5.7–8.2)
PROTEIN TOTAL: 6.4 g/dL (ref 5.7–8.2)
SODIUM: 138 mmol/L (ref 135–145)
SODIUM: 136 mmol/L (ref 135–145)

## 2024-05-04 LAB — CYSTATIN C
CYSTATIN C: 2.56 mg/L — ABNORMAL HIGH (ref 0.64–1.23)
EGFR CKD-EPI (2012) CYSTATIN C FEMALE: 21 mL/min/{1.73_m2} — ABNORMAL LOW (ref >=60)

## 2024-05-04 LAB — HIGH SENSITIVITY TROPONIN I - 2 HOUR SERIAL
HIGH SENSITIVITY TROPONIN - DELTA (0-2H): 993 ng/L (ref <=7)
HIGH-SENSITIVITY TROPONIN I - 2 HOUR: 6492 ng/L (ref <=34)

## 2024-05-04 LAB — QUANTRA QSTAT
CLOT STABILITY TO LYSIS (CSL): 98 % (ref 92–100)
CLOT STIFFNESS (CS): 9.6 hPa — ABNORMAL LOW (ref 14.0–35.4)
CLOT TIME (CT): 130 s (ref 121–175)
FIBRINOGEN CONTRIBUTION TO CLOT STIFFNESS (FCS): 1.2 hPa (ref 0.9–4.2)
PLATELET CONTRIBUTION TO CLOT STIFFNESS (PCS): 8.4 hPa — ABNORMAL LOW (ref 12.8–32.3)

## 2024-05-04 LAB — HEMOGLOBIN A1C
ESTIMATED AVERAGE GLUCOSE: 114 mg/dL
HEMOGLOBIN A1C: 5.6 % (ref 4.8–5.6)

## 2024-05-04 LAB — APTT
APTT: 39.8 s — ABNORMAL HIGH (ref 24.8–38.4)
HEPARIN CORRELATION: 0.2

## 2024-05-04 LAB — D-DIMER, QUANTITATIVE: D-DIMER QUANTITATIVE (ACL TOP): 10469 ng{FEU}/mL — ABNORMAL HIGH (ref <=500)

## 2024-05-04 LAB — HIGH SENSITIVITY TROPONIN I - SERIAL: HIGH SENSITIVITY TROPONIN I: 5499 ng/L (ref <=34)

## 2024-05-04 LAB — FIBRINOGEN: FIBRINOGEN LEVEL: 119 mg/dL — ABNORMAL LOW (ref 175–500)

## 2024-05-04 LAB — TRIGLYCERIDES: TRIGLYCERIDES: 134 mg/dL (ref <150)

## 2024-05-04 MED ORDER — MAGNESIUM SULFATE 50 % IJ SOLN
INTRAVENOUS | Status: DC | PRN
  Administered 2024-05-04: 2 g via INTRAVENOUS

## 2024-05-04 MED ORDER — SODIUM CHLORIDE 0.9 % IV SOLN
1.0000 g | INTRAVENOUS | Status: DC
  Filled 2024-05-04: qty 10

## 2024-05-04 MED ORDER — ESMOLOL HCL 100 MG/10ML IV SOLN
INTRAVENOUS | Status: DC | PRN
  Administered 2024-05-04 (×2): 10 mg via INTRAVENOUS

## 2024-05-04 MED ORDER — PROPOFOL 500 MG/50ML IV EMUL
INTRAVENOUS | Status: DC | PRN
  Administered 2024-05-04: 60 ug/kg/min via INTRAVENOUS

## 2024-05-04 MED ORDER — IOHEXOL 300 MG/ML  SOLN
100.0000 mL | Freq: Once | INTRAMUSCULAR | Status: AC | PRN
  Administered 2024-05-04: 130 mL via INTRAVENOUS

## 2024-05-04 MED ORDER — MIDAZOLAM HCL 2 MG/2ML IJ SOLN: 1.0000 mg | INTRAMUSCULAR | Status: DC | PRN

## 2024-05-04 MED ORDER — FENTANYL CITRATE PF 50 MCG/ML IJ SOSY: 50.0000 ug | PREFILLED_SYRINGE | INTRAMUSCULAR | Status: DC | PRN

## 2024-05-04 MED ORDER — ETOMIDATE 2 MG/ML IV SOLN
INTRAVENOUS | Status: DC | PRN
  Administered 2024-05-03: 10 mg via INTRAVENOUS

## 2024-05-04 MED ORDER — INSULIN ASPART 100 UNIT/ML IJ SOLN
0.0000 [IU] | INTRAMUSCULAR | Status: DC
  Administered 2024-05-04: 15 [IU] via SUBCUTANEOUS
  Filled 2024-05-04: qty 1

## 2024-05-04 MED ORDER — PROPOFOL 1000 MG/100ML IV EMUL
INTRAVENOUS | Status: AC
  Filled 2024-05-04: qty 100

## 2024-05-04 MED ORDER — FENTANYL CITRATE (PF) 100 MCG/2ML IJ SOLN
INTRAMUSCULAR | Status: DC | PRN
  Administered 2024-05-03: 50 ug via INTRAVENOUS
  Administered 2024-05-04: 25 ug via INTRAVENOUS

## 2024-05-04 MED ORDER — DOCUSATE SODIUM 50 MG/5ML PO LIQD: 100.0000 mg | Freq: Two times a day (BID) | ORAL | Status: DC

## 2024-05-04 MED ORDER — LIDOCAINE HCL (CARDIAC) PF 100 MG/5ML IV SOSY
PREFILLED_SYRINGE | INTRAVENOUS | Status: DC | PRN
  Administered 2024-05-03: 100 mg via INTRAVENOUS

## 2024-05-04 MED ORDER — POLYETHYLENE GLYCOL 3350 17 G PO PACK: 17.0000 g | PACK | Freq: Every day | ORAL | Status: DC

## 2024-05-04 MED ORDER — ROCURONIUM BROMIDE 100 MG/10ML IV SOLN
INTRAVENOUS | Status: DC | PRN
  Administered 2024-05-04: 50 mg via INTRAVENOUS
  Administered 2024-05-04: 20 mg via INTRAVENOUS

## 2024-05-04 MED ORDER — LACTATED RINGERS IV SOLN: INTRAVENOUS | Status: DC | PRN

## 2024-05-04 MED ORDER — SUCCINYLCHOLINE CHLORIDE 200 MG/10ML IV SOSY
PREFILLED_SYRINGE | INTRAVENOUS | Status: DC | PRN
  Administered 2024-05-03: 80 mg via INTRAVENOUS

## 2024-05-04 MED ORDER — PROPOFOL 1000 MG/100ML IV EMUL
0.0000 ug/kg/min | INTRAVENOUS | Status: DC
  Administered 2024-05-04: 5 ug/kg/min via INTRAVENOUS

## 2024-05-04 MED ADMIN — etomidate (AMIDATE) 2 mg/mL injection: @ 21:00:00 | Stop: 2024-05-04

## 2024-05-04 MED ADMIN — calcium gluconate in sodium chloride (NS) 0.9% 2 gram/100 mL IVPB 2 g: 2 g | INTRAVENOUS | @ 21:00:00 | Stop: 2024-05-04

## 2024-05-04 MED ADMIN — sodium bicarbonate 1 mEq/mL (8.4 %) injection: @ 20:00:00 | Stop: 2024-05-04

## 2024-05-04 MED ADMIN — sodium chloride 0.9% (NS) bolus 1,000 mL: 1000 mL | INTRAVENOUS | @ 15:00:00 | Stop: 2024-05-04

## 2024-05-04 MED ADMIN — pantoprazole (Protonix) injection 40 mg: 40 mg | INTRAVENOUS | @ 12:00:00

## 2024-05-04 MED ADMIN — fentaNYL (PF) (SUBLIMAZE) injection 50 mcg: 50 ug | INTRAVENOUS | @ 17:00:00 | Stop: 2024-05-18

## 2024-05-04 MED ADMIN — insulin lispro (HumaLOG) injection 0-20 Units: 0-20 [IU] | SUBCUTANEOUS | @ 14:00:00

## 2024-05-04 MED ADMIN — levETIRAcetam (KEPPRA) injection 750 mg: 750 mg | INTRAVENOUS | @ 12:00:00

## 2024-05-04 MED ADMIN — fentaNYL (PF) (SUBLIMAZE) injection 50 mcg: 50 ug | INTRAVENOUS | @ 19:00:00 | Stop: 2024-05-18

## 2024-05-04 MED ADMIN — EPINEPHrine 8 mg in dextrose 5% 250 mL (32 mcg/mL) infusion PMB: 0-20 ug/min | INTRAVENOUS | @ 20:00:00

## 2024-05-04 MED ADMIN — piperacillin-tazobactam (ZOSYN) IVPB (premix) 4.5 g: 4.5 g | INTRAVENOUS | @ 11:00:00 | Stop: 2024-05-04

## 2024-05-04 MED ADMIN — hydrocortisone sod succ (Solu-CORTEF) injection 50 mg: 50 mg | INTRAVENOUS | @ 16:00:00

## 2024-05-04 MED ADMIN — vasopressin 20 units in 100 mL (0.2 units/mL) infusion premade vial: .03 [IU]/min | INTRAVENOUS | @ 12:00:00

## 2024-05-04 MED ADMIN — NORepinephrine 8 mg in dextrose 5 % 250 mL (32 mcg/mL) infusion PMB: 0-40 ug/min | INTRAVENOUS | @ 12:00:00

## 2024-05-04 MED ADMIN — lactated ringers bolus 1,000 mL: 1000 mL | INTRAVENOUS | @ 15:00:00 | Stop: 2024-05-04

## 2024-05-04 MED ADMIN — NORepinephrine 8 mg in dextrose 5 % 250 mL (32 mcg/mL) infusion PMB: 0-40 ug/min | INTRAVENOUS | @ 11:00:00

## 2024-05-04 MED ADMIN — insulin lispro (HumaLOG) injection 0-20 Units: 0-20 [IU] | SUBCUTANEOUS | @ 18:00:00

## 2024-05-04 MED ADMIN — propofol (DIPRIVAN) infusion 10 mg/mL: 0-50 ug/kg/min | INTRAVENOUS | @ 11:00:00

## 2024-05-04 MED ADMIN — insulin NPH (HumuLIN,NovoLIN) injection 15 Units: 15 [IU] | SUBCUTANEOUS | @ 18:00:00

## 2024-05-04 MED ADMIN — propofol (DIPRIVAN) infusion 10 mg/mL: 0-50 ug/kg/min | INTRAVENOUS | @ 12:00:00

## 2024-05-04 MED ADMIN — octreotide 500 mcg in sodium chloride 0.9 % 100 mL (5 mcg/mL) infusion: 50 ug/h | INTRAVENOUS | @ 11:00:00

## 2024-05-04 MED ADMIN — vancomycin (VANCOCIN) 2000 mg IVPB in 500 mL sodium chloride 0.9% (premix): 2000 mg | INTRAVENOUS | @ 14:00:00 | Stop: 2024-05-04

## 2024-05-04 MED ADMIN — HYDROmorphone (PF) (DILAUDID) 1 mg/mL injection: @ 21:00:00 | Stop: 2024-05-04

## 2024-05-04 MED ADMIN — tobramycin (NEBCIN) 275 mg in sodium chloride (NS) 0.9 % 100 mL IVPB: 275 mg | INTRAVENOUS | @ 14:00:00 | Stop: 2024-05-04

## 2024-05-04 MED ADMIN — vasopressin 20 units in 100 mL (0.2 units/mL) infusion premade vial: .03 [IU]/min | INTRAVENOUS | @ 11:00:00

## 2024-05-04 MED ADMIN — sodium chloride 0.9% (NS) bolus 2,000 mL: 2000 mL | INTRAVENOUS | @ 13:00:00 | Stop: 2024-05-04

## 2024-05-04 MED ADMIN — phenylephrine 1 mg/10 mL (100 mcg/mL) injection Syrg: @ 20:00:00 | Stop: 2024-05-04

## 2024-05-04 MED ADMIN — piperacillin-tazobactam (ZOSYN) IVPB (premix) 4.5 g: 4.5 g | INTRAVENOUS | @ 17:00:00 | Stop: 2024-05-11

## 2024-05-04 MED ADMIN — fentaNYL (PF) (SUBLIMAZE) injection 50 mcg: 50 ug | INTRAVENOUS | @ 21:00:00 | Stop: 2024-05-18

## 2024-05-04 MED ADMIN — phenylephrine 100 mg in sodium chloride 0.9 % 250 mL (0.4 mg/mL) infusion: 0-300 ug/min | INTRAVENOUS | @ 14:00:00

## 2024-05-04 MED ADMIN — NORepinephrine 8 mg in dextrose 5 % 250 mL (32 mcg/mL) infusion PMB: 0-40 ug/min | INTRAVENOUS | @ 16:00:00

## 2024-05-04 MED ADMIN — fentaNYL (PF) (SUBLIMAZE) injection 50 mcg: 50 ug | INTRAVENOUS | @ 20:00:00 | Stop: 2024-05-04

## 2024-05-04 MED ADMIN — midazolam (VERSED) injection 2 mg: 2 mg | INTRAVENOUS | @ 21:00:00 | Stop: 2024-05-04

## 2024-05-04 MED ADMIN — electrolyte-A (PLASMA-LYT A) infusion: INTRAVENOUS

## 2024-05-04 NOTE — Unmapped (Signed)
 Patient currently intubated 7.0 ETT 23 @ the gums. Paitent remained on PS during the morning and afternoon no issues. This evening patient became hypotensive and tachycardiac almost coding, placed patient back on rate and increased O2 to 100%. Currently Mds at bedside attempting a [place]  tube for esophageal varices.

## 2024-05-04 NOTE — Unmapped (Signed)
 Received referral to verify patients [employer] has been verified.      Patient has active coverage with [facility], including prescription drug coverage    Authorization is [id number] for outpatient Liver transplant evaluation valid 04/26/24 to 04/26/25. Per Bruce Caper if needing inpatient evaluation it will fall under the inpatient admission     Patient is financially cleared for inpatient and outpatient Liver transplant evaluation

## 2024-05-04 NOTE — Unmapped (Signed)
 Central Venous Catheter Insertion Procedure Note     Date of Service: 05/04/2024    Patient Name: Ashley Briggs  Patient MRN: [id number]    Line type:  Cordis    Indications:  Hypovolemia    Consent:  Informed consent was NOT obtained. Procedure was performed emergently in setting of cf acute bleed with increasing pressor requirement    Procedure Details:   Time-out was performed immediately prior to the procedure.    The left femoral (femoral triangle and artery were identified because other sites were contraindicated) vein was identified using bedside ultrasound. This area was prepped and draped in the usual sterile fashion. Maximum sterile technique was used including antiseptics, cap, gloves, gown, hand hygiene, mask, and sterile sheet.  The patient was placed in Trendelenburg position. Local anesthesia with 1% lidocaine  was applied subcutaneously then deep to the skin. The finder was then inserted into the femoral (femoral triangle and artery were identified because other sites were contraindicated) vein using ultrasound guidance.    Using the Seldinger Technique a Cordis was placed with each port easily flushed and freely drawing venous blood.    The catheter was secured with sutures. A sterile CHG drsg was applied to the site.    Condition:  The patient tolerated the procedure well and remains in the same condition as pre-procedure.    Complications:  None; patient tolerated the procedure well.    Plan:  CXR confirmed appropriate placement of central line. CVAD order placed OK to use     Horace Lukas A Binyamin Nelis, MD

## 2024-05-04 NOTE — Unmapped (Cosign Needed)
 [facility] - Operative Note      VIR Procedure Note     Procedure Name: TIPS and paracentesis     Pre-Op Diagnosis: Bleeding esophageal varices     Post-Op Diagnosis: Same as pre-operative diagnosis    VIR Providers  Attending: Dr. Leocadia Rains  Trainee: Dr. Tyra Galley     Time out: Prior to the procedure, a time out was performed with all team members present. During the time out, the patient, procedure, and procedure site when applicable were verbally verified by the team members.     Description of procedure: Successful TIPS with reduction of portosystemic gradient from 14 mmHg to 9 mmHg. Paracentesis with removal of 4.7 L ascites.     Pre Post   Right atrium (mmHg) 20 18   Portal vein (mmHg) 34 27      Sedation: General anesthesia     Estimated Blood Loss: approximately 50 mL  Complications: None     See detailed procedure note with images in PACS [facility]) or under the Imaging tab in EPIC.     The patient tolerated the procedure well without incident or complication and was returned to the ICU in stable condition.     Plan:   - Patient is on broad spectrum antibiotic regimen (Vanc/Zosyn) which will provide adequate coverage for post-procedure prophylaxis. Recommend continuing for 7 days and if stopped before then, recommend Levaquin 500 mg daily.  - Trend LFTs    Tyra Galley, MD  VIR PGY-6

## 2024-05-04 NOTE — Unmapped (Signed)
 Patient is seen in consultation at the request of Subhashini Brion Cancel, MD with Medical ICU (MDI) to evaluate for possible TIPS.    Assessment & Recommendations     Ashley Briggs is a 58 y.o. female with NASH cirrhosis, esophageal varices status post multiple rounds of banding at outside facility.  Recent EGD at outside hospital found no additional esophageal varices that were candidates for banding and probable ulceration related to banding with stigmata of recent bleeding.  Patient underwent attempted TIPS placement at outside facility, but this was unsuccessful despite numerous attempts, reportedly secondary to difficult patient anatomy.  Patient also developed monomorphic V. tach and was hemodynamically unstable and required defibrillation during the attempted TIPS placement.  Patient did not lose a pulse during this time, though.  Outside IR group had planned to repeat a TIPS attempt via gunsight method, but patient was then transferred to [facility] for upgraded level of care secondary to 2 pressor shock.  Hepatology team following and favors that patient is in hemorrhagic shock. VIR consulted to evaluate for urgent TIPS procedure in the setting of ongoing bleeding and no additional endoscopic treatment options at this time.    Of note, blood cultures were drawn this AM at 0654 and are pending. Leukocytosis of 23 at the moment.    MELD 3.0: 22 at 05/04/2024  8:37 AM  MELD-Na: 19 at 05/04/2024  8:37 AM  Calculated from:  Serum Creatinine: 1.02 mg/dL at 12/06/1094  0:45 AM  Serum Sodium: 136 mmol/L at 05/04/2024  8:37 AM  Total Bilirubin: 7.3 mg/dL at 4/0/9811  9:14 AM  Serum Albumin: 2.5 g/dL at 06/08/2955  2:13 AM  INR(ratio): 1.46 at 05/04/2024  6:16 AM  Age at listing (hypothetical): 57 years  Sex: Female at 05/04/2024  8:37 AM    Patient currently undergoing echocardiogram here at [facility]. Prior echo results from OSH 01/26/24: [facility]):   1. Left ventricular ejection fraction, by estimation, is >55%. The left ventricle has normal function. Left ventricular endocardial border not optimally defined to evaluate regional wall motion. Left ventricular diastolic parameters were normal.    2. Right ventricular systolic function is normal. The right ventricular size is normal. Tricuspid regurgitation signal is inadequate for assessing PA pressure.    3. The mitral valve is normal in structure. No evidence of mitral valve regurgitation. No evidence of mitral stenosis.    4. The aortic valve was not well visualized. Aortic valve regurgitation is not visualized. No aortic stenosis is present.     - Recommend proceeding with TIPS and possible paracentesis.  - Anticipated procedure date: 05/05/24, pending VIR schedule  - Sedation plan: General anesthesia  - NPO at midnight the night prior to procedure.  - This is a high bleeding risk procedure.   INR <= 1.8.   Platelets >= 50  The patient is not on anticoagulation. Please notify the VIR team if starting new anticoagulation after our initial evaluation and assessment.  - Current labs are sufficient. No new labs are needed.  - Iodinated contrast allergy: No.    This plan was discussed with Dr. Terrall Ferraris.    Informed Consent  Yes; this procedure has been fully reviewed with the patient's authorized representative, Aunt Nola Battiest, . This included a discussion of the risks, benefits, and alternatives including but not limited to bleeding, infection, mortality risk, hepatic encephalopathy, and/or damage to surrounding structures. All questions were answered to their satisfaction, and they would like to proceed with the procedure.   --  The patient will accept blood products in an emergent situation.  --The patient does not have a Do Not Resuscitate order in effect.    Consent obtained by Shelly Diamond, MD, witnessed, and scanned into patient's chart (see media tab).    Thank you for involving us  in the care of this patient. Please page the VIR consult pager [phone]) with further questions, concerns, or if new issues arise.     Subjective     History of Present Illness:  Ashley Briggs is a 58 y.o. female with past medical history as listed below including CAD, CKD, [name] cirrhosis with associated ascites, prior hepatic encephalopathy, and esophageal varices status post banding.  Recent hematemesis 6 days ago status post repeat variceal banding.  Most recent EGD at outside facility showed that there were no more lesions amenable to banding and so patient was recommended for TIPS placement.  TIPS attempt performed at outside facility was unsuccessful despite multiple attempts, reportedly secondary to difficult patient anatomy.  Notably, patient also had a run of V. tach with pulse and associated hemodynamic instability patient that required defibrillation.  Patient then transferred to [facility] for MICU care in 2 pressor shock.  Hepatology team following and favors the patient is in hemorrhagic shock.  VIR consulted to evaluate for TIPS placement attempt.  Reportedly outside facility was planning on repeat TIPS attempt with gunsight method.    Review of Systems: Pertinent items are noted in HPI.    Past Medical History:  Past Medical History:   Diagnosis Date    Anemia     Angular blepharoconjunctivitis of both eyes 12/21/2020    Anxiety     Arthritis     Ascites     Blepharitis     Calcified cerebral meningioma   08/16/2022    Cataract associated with type 2 diabetes mellitus   12/21/2020    Chronic headaches     Chronic pain     Chronic pain disorder 2029    I have multiple issues. Seeing doctor's regarding  issues    Cirrhosis       Coronary artery disease involving native heart without angina pectoris 02/05/2021    Current moderate episode of major depressive disorder 11/06/2022    Diabetes mellitus       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 type 2 diabetes mellitus   12/02/2022    Dry eye syndrome, bilateral 12/21/2020    Dysphagia 02/02/2023    Endometrial cancer   2007    Esophageal varices in cirrhosis   06/17/2022    Fractures 01/29/2018    Gastroparesis     Generalized pruritus 02/02/2023    GERD (gastroesophageal reflux disease) It's  been going on for years    I take medication  which helps tremendously    Glaucoma 2023    I see a doctor on a regular basis    H/O diabetic gastroparesis 01/09/2021    Heart disease     Hemorrhage of rectum and anus 03/17/2011    Hepatic cirrhosis   06/17/2022    Hepatic encephalopathy   06/17/2022    Hepatic steatosis 03/13/2021    Noted on CT Fall 2021    High cholesterol     History of encephalopathy     Hx of psoriasis 08/06/2022    Hyperlipidemia 12/12/2015    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    Liver disease     Major depression  Metabolic dysfunction-associated steatohepatitis (MASH)     Morbid obesity 11/10/2019    Myopia of both eyes with astigmatism and presbyopia 12/02/2022    Nausea 09/22/2022    Non-alcoholic fatty liver disease     NPDR (nonproliferative diabetic retinopathy)       Right eye    Other ascites 09/22/2022    Other chronic pain 11/06/2022    Other insomnia 11/06/2022    PFD (pelvic floor dysfunction) 04/07/2013    Portal hypertension   10/08/2022    Psoriasis     Ptosis of eyelid, left     Left upper eyelid    Reflux     Retinopathy of left eye, background, proliferative 01/09/2021    Right upper quadrant abdominal pain 09/22/2022    Secondary esophageal varices without bleeding   06/17/2022    Type 2 diabetes mellitus, with long-term current use of insulin    12/12/2015    Last Assessment & Plan:   The current medical regimen is effective;  continue present plan and medications.  Patient will continue good management of diabetes especially with weight loss. Hopefully with further weight loss will run into the problem of being overmedicated and having to cut back on medications     Some confusion on diabetes medications was have not.been refilled for over a year.  Pa       Past Surgical History:  Past Surgical History:   Procedure Laterality Date    CHOLECYSTECTOMY      HYSTERECTOMY      endometrial cancer    HYSTERECTOMY      OOPHORECTOMY      OTHER SURGICAL HISTORY  Hysterectomy was in 2007. Gallbladder was in 1999 and phrenectomy  was in 2024    Hysterectomy,  gallbladder removed and phrenectomy    PR ANAL PRESSURE RECORD Left 03/31/2013    Procedure: ANORECTAL MANOMETRY;  Surgeon: None None;  [facility]: [facility];  Service: Gastroenterology    PR BREATH HYDROGEN TEST N/A 03/31/2013    Procedure: BREATH HYDROGEN TEST;  Surgeon: None None;  Location: [facility];  Service: Gastroenterology    PR COLSC FLX W/RMVL OF TUMOR POLYP LESION SNARE TQ N/A 09/06/2020    Procedure: COLONOSCOPY FLEX; W/REMOV TUMOR/LES BY SNARE;  Surgeon: Benedetto Brady, MD;  Location: [facility];  Service: Gastroenterology    PR UPPER GI ENDOSCOPY,BIOPSY N/A 04/15/2013    Procedure: UGI ENDOSCOPY; WITH BIOPSY, SINGLE OR MULTIPLE;  Surgeon: Allene Ards, MD;  Location: [facility];  Service: Gastroenterology    PR UPPER GI ENDOSCOPY,BIOPSY N/A 09/06/2020    Procedure: UGI ENDOSCOPY; WITH BIOPSY, SINGLE OR MULTIPLE;  Surgeon: Benedetto Brady, MD;  Location: [facility];  Service: Gastroenterology    PR UPPER GI ENDOSCOPY,BIOPSY N/A 05/05/2022    Procedure: UGI ENDOSCOPY; WITH BIOPSY, SINGLE OR MULTIPLE;  Surgeon: Benedetto Brady, MD;  Location: [facility];  Service: Gastroenterology    PR UPPER GI ENDOSCOPY,BIOPSY N/A 01/22/2023    Procedure: UGI ENDOSCOPY; WITH BIOPSY, SINGLE OR MULTIPLE;  Surgeon: Celestia Colander, MD;  Location: [facility];  Service: Gastroenterology       Allergies:  No Known Allergies     Objective     Physical Exam:  Vitals:    05/04/24 0822   Pulse: 121   Resp: 19   Temp:    SpO2: 98%        Sedated, intubated  Tachycardic  Airway  assessment: Intubated    ASA 4 - Patient with severe systemic disease that is a constant threat to life    Pertinent Labs:  Recent Labs     05/04/24  0440 05/04/24  0604 05/04/24  0616 05/04/24  0837   WBC  --   --  24.9* 23.7*   HGB  --  8.9* 9.3* 9.0*   HCT 30*  --  28.1* 27.8*   PLT  --   --  253 229       Recent Labs     05/04/24  0440 05/04/24  0604 05/04/24  0838   NA 138 137 134*   K 4.7* 4.6 4.7*   Estimated Creatinine Clearance: 83.7 mL/min (based on SCr of 0.78 mg/dL).     Recent Labs     05/04/24  0616   INR 1.46       No results for input(s): PROT, ALBUMIN, AST, ALT, ALKPHOS, BILITOT, BILIDIR in the last 72 hours.    Blood Cultures:  Lab Results   Component Value Date    LABBLOO No Growth at 5 days 08/17/2023    LABBLOO No Growth at 5 days 08/17/2023       Pertinent Imaging: Most recent imaging from outside facility not available for review at time of consult.  Please try to have most recent imaging PowerShared for our review.  MRI abdomen dated 08/18/2023 was reviewed at time of consult.

## 2024-05-04 NOTE — Unmapped (Signed)
 Hepatology Consult Service   Initial Consultation         Assessment and Recommendations:   58 year old female with decompensated MASH cirrhosis complicated by portal hypertension, esophageal varices, ascites, and hepatic encephalopathy who presents with hematemesis. The patient is seen in consultation at the request of Subhashini Brion Cancel, MD (Medical ICU (MDI)) for decompensated cirrhosis with variceal bleeding.    UGIB 2/2 post-banding esophageal ulceration   Patient is currently critically ill after presentation with massive upper GI bleed due to ulceration at site of esophageal variceal banding.  A TIPS procedure was attempted by outside interventional radiology team but was unsuccessful.  There is concern that she has altered anatomy or a portal venous thrombosis which is making it difficult to perform a TIPS.  She is not currently having evidence of ongoing bleeding but is certainly at very high risk of rebleed in this scenario and thus recommend consulting VIR here at [facility]. Will also discuss concurrent evaluation for urgent liver transplant.  - consult VIR   - trend Hgb and transfuse for goal >7   - Liver doppler ultrasound; also obtain OSH imaging from 5/17 to review PV anatomy. If unrevealing, low threshold to obtain CT Abdomen W/Wo Contrast   - obtain TTE   - octreotide at 100mcg/hr   - continue abx   - do not give routine FFP or PLT. Would rather utilize QStat q6 hrs to determine if she requires product; alternatively can check fibrinogen and maintain >100 with cryo   - low threshold to place [place]  tube if concern for ongoing bleeding    Shock  Remains in shock on 2 pressors despite stabilization of bleeding.  Recommend broad infectious workup for possible septic shock and agree with empiric tx   - infectious workup: blood cultures x2, UA/Ucx, diagnostic paracentesis if possible   - agree with empiric abx and recommend broadening coverage with antifungals     Decompensated MASH Cirrhosis   Followed by Dr. Rolande Cleverly in the liver clinic.  Cirrhosis has been complicated by portal hypertension, esophageal varices, ascites, and hepatic encephalopathy.  For her hepatic encephalopathy, she is on lactulose  and rifaximin .  She also has ascites with diuretics is limited by hyponatremia for which she receives paracenteses for comfort.  She is also on naltrexone  for pruritus.  - continue lactulose  for 3-5 bowel movements daily and rifaximin    - hold diuretics iso hypotension    - MELD 3.0: 25 at 05/04/2024 11:08 AM  MELD-Na: 22 at 05/04/2024 11:08 AM  Calculated from:  Serum Creatinine: 1.02 mg/dL at 12/06/1094  0:45 AM  Serum Sodium: 136 mmol/L at 05/04/2024  8:37 AM  Total Bilirubin: 7.3 mg/dL at 4/0/9811  9:14 AM  Serum Albumin: 2.5 g/dL at 06/08/2955  2:13 AM  INR(ratio): 1.9 at 05/04/2024 11:08 AM  Age at listing (hypothetical): 57 years  Sex: Female at 05/04/2024 11:08 AM    ADDENDUM  Patient developed worsening shock throughout the day. She did not have bloody bowel movements but Hgb did drop 3pts. EGD was performed at bedside which showed non-bleeding esophageal ulcers and significant amount of red blood in the stomach which could not be cleared to allow for visualization.     Later in the day, she developed worsening shock. Decision made to place a blakemore. Failed despite multiple attempts, even with endoscopic guidance. Will reach out to VIR about TIPS.     Issues Impacting Complexity of Management:  -The patient has the need for intensive monitoring parameter(s) due to  high-risk of clinical decline: q4h or more frequent monitoring of hemoglobins to monitor for stability of GI bleeding and frequent monitoring of MELD score to monitor for progression to/progression of acute or acute on chronic liver failure    Recommendations discussed with the patient's primary team. We will continue to follow along with you.    Subjective:   58 year old female with decompensated MASH cirrhosis complicated by portal hypertension, esophageal varices, ascites, and hepatic encephalopathy who presents with hematemesis.     She is followed by Dr. Rolande Cleverly and [facility] liver clinic, last clinic visit 5/27.  Given worsening MELD to 20 over the past year, they discussed initiating transplant evaluation. She underwent a routine upper endoscopy at [facility] on 5/29 with banding of grade 3 varices.    Patient then presented to [facility] 6/3 with hematemesis.  EGD showed bleeding esophageal ulcers not amenable to banding.  He was unstable with ongoing bleeding.  Emergent TIPS was attempted but could not be performed successfully due to inability to puncture into the portal vein from the hepatic vein.  Of note, she did have monomorphic VT during the procedure requiring cardioversion but she did not lose her pulses during the procedure.  She was then transferred here to [facility] overnight for refractory portal hypertensive bleeding and need for urgent transplant evaluation.    Here in the ICU, she does not have evidence of ongoing bleeding on exam and hemoglobin is stable.  She is hypotensive on norepinephrine and vasopressin.  She remains intubated.    -I have reviewed the patient's prior records from [facility] health as summarized in the HPI    Objective:   Temp:  [36 ??C (96.8 ??F)-36.9 ??C (98.4 ??F)] 36.8 ??C (98.2 ??F)  Pulse:  [116-138] 137  SpO2 Pulse:  [116-139] 137  Resp:  [16-25] 25  FiO2 (%):  [40 %-100 %] 40 %  SpO2:  [97 %-100 %] 100 %    Gen: Ill appearing  HEENT: intubated   Abdomen:  soft, moderately distended     Pertinent Labs & Studies:  -I have reviewed the patient's labs from 05/04/24 which show down-trending Hgb, worsening LFTs, and stable INR

## 2024-05-04 NOTE — Unmapped (Signed)
 MICU History & Physical     Date of Service: 05/04/2024    Problem List:   Principal Problem:    Shock    Active Problems:    Type 2 diabetes mellitus, with long-term current use of insulin       Hyperlipidemia    Hypertension    Coronary artery disease involving native heart without angina pectoris    Diabetes mellitus      Esophageal varices in cirrhosis      Hepatic encephalopathy      Calcified cerebral meningioma      Portal hypertension      Other chronic pain    OSA (obstructive sleep apnea)    Stage 3a chronic kidney disease    Hematemesis      HPI: Ashley Briggs is a 58 y.o. female w/ PMH CAD c/b NSTEMI, CKD, NASH cirrhosis c/b ascites, hepatic encephalopathy, and esophageal varices s/p banding who presented to OSH with hematemesis 6 days s/p variceal banding. Underwent EGD at OSH which found lesions unamenable to banding and then underwent unsuccessful TIPS (c/f portal vein thrombus) c/b Vtach (with pulse) requiring defibrillation, transferred to [facility] MICU with 2 pressor shock for accelerated transplant workup.     24hr events: as above    Neurological   Sedation  Intubated and sedated with propofol  gtt.    - Continue propofol  gtt.  - Triglycerides every 96 hours while on propofol  gtt.  - RASS goal 0 to -1  - Daily spontaneous awakening trial    History of seizure-like activity  - Continue home Keppra  750 mg twice daily, administer IV while patient is n.p.o.    Chronic pain  Patient has history of chronic pain treated with baclofen , naltrexone , oxycodone .  Holding medications right now in setting of hypotension and sedation.  - Hold home baclofen , naltrexone , oxycodone     MDD  - hold home buproprion  - hold home citalopram     Insomnia  - hold home zolpidem     PMH calcified cerebral meningioma  - no intervention at this time    Analgesia: Pain adequately controlled  RASS at goal? Yes  [place] Agitation Assessment Scale (RASS) : 0 (08/20/2023  8:25 AM)       Pulmonary   Intubation and mechanical ventilation  Patient is intubated for mental status and mechanically ventilated.  Can consider extubation once mental status improved.  - ABG  - Sedation as above    FiO2 (%):  [80 %-100 %] 80 %  PR SUP:  [5 cm H20] 5 cm H20  O2 Device: Ventilator    Cardiovascular   Undifferentiated shock  On arrival patient requiring pressor support with norepinephrine and vasopressin.  Received 25 g albumin x2 at OSH. At OSH had rising lactate (>9).  Unclear etiology of shock at this time, though differential includes septic shock or hemorrhagic/hypovolemic given recurrent hematemesis in past weeks.  - Continue norepinephrine, vasopressin  - Repeat Criticare ABG, trend lactate if still elevated.  - Broaden antibiotics to vancomycin/Zosyn  - Infectious workup as below for decompensated cirrhosis    Ventricular tachycardia  During TIPS procedure patient developed this monomorphic VT with hemodynamic instability requiring defibrillation, though she did not lose her pulse.  Given patient's CAD history, concerning for V. tach secondary to ischemic scar.  - EKG  - TTE  - daily CMP  - Replete K>4, Mg>2    CAD c/b PMH NSTEMI  Per chart review, patient has history of NSTEMI. No LHC data available. Unclear  if she has history of coronary stent.  - hold home aspirin  in setting of bleed        Renal   AKI  Creatinine at OSH elevated to 1.29 from baseline ~0.8.  Suspect prerenal etiology secondary to poor renal perfusion in setting of shock.  - Daily CMP  - Management of shock as above    Infectious Disease/Autoimmune     See above for management of undifferentiated shock including concern for septic shock.  See below for infectious workup for decompensated cirrhosis.      Cultures:  No results found for: BLOOD CULTURE, BLOOD CULTURE, ROUTINE, URINE CULTURE, COMPREHENSIVE, URINE CULTURE, COMPREHENSIVE, LOWER RESPIRATORY CULTURE  WBC (10*9/L)   Date Value   05/04/2024 24.9 (H)     WBC, UA (/HPF)   Date Value   05/04/2024 1 FEN/GI   Esophageal varices s/p banding - Hematemesis  - acute on chronic anemia   Presented to outside hospital with hematemesis 6 days after undergoing esophageal variceal banding (04/28/2024).  Patient underwent egd at OSH with demonstrated esophageal ulcerations not amenable to banding.  She then underwent emergent TIPS procedure due to ongoing bleeding and hypotension, but the TIPS was unsuccessful due to inability to access the portal vein, concerning for portal vein thrombus.  S/p 3 units PRBC. On arrival to MICU, no longer having hematemesis.  - GI consult, appreciate recs  - Octreotide gtt at increased dose (100 mcg/hr)  - Continue Zosyn as above, narrowed to ceftriaxone  for GI prophylaxis as able  - CBC every 4 hours  - Maintain active type and screen  - Hgb >7, fibrinogen >100    Decompensated Cirrhosis - Ascites - PMH Hepatic Encephalopathy - Portal Hypertension  Unclear etiology for decompensation at this time, though there is concern for portal vein thrombosis given unsuccessful attempt at TIPS at outside hospital. Initiating infectious workup, obtaining Doppler liver ultrasound.  Of note patient had recent hospitalization for Klebsiella oxytoca (01/2024) bacteremia.MELD score 24 at OSH based on Cr 1.0, t bili 4.8, INR 1.5, Sodium 130, Albumin 2.0.  - Hepatology consult, appreciate recs  - Daily MELD labs (CMP, PT/INR)  - Diagnostic paracentesis  - Stat liver Doppler ultrasound  - Blood cultures  - Urinalysis/urine culture  - MRSA nares  - hold home lasix , spironolactone    - hold home rifaxamin, lactulose           Provider Malnutrition Assessment:  Body mass index is 33.59 kg/m??.BMI Interpretation: >/= 30 and < 40, consistent with obesity, clinically significant requiring additional resources and complicating multiple aspects of patient care.  GLIM criteria:   Pt does not meet criteria  -I have screened this patient for malnutrition and they did NOT meet criteria for malnutrition based on GLIM criteria.  -Nutrition consulted no    Heme/Coag   Acute on Chronic Anemia  See above for management of acute blood loss ISO variceal bleed.  Unclear baseline hemoglobin, likely ~10, chronic anemia likely secondary to chronic liver disease.  - Daily CBC with differential  - Management of GI bleed as above    Endocrine   Insulin -dependent type 2 diabetes  Last A1c 7.4 in 11/2023.  - A1c  - SSI  - Every 6 hours POC glucose checks while n.p.o.    Integumentary     Psoriasis  Patient has history of psoriasis.  On presentation has numerous chronic appearing scattered erythematous plaques with silvery scale consistent with psoriasis.  Unclear if she is on any treatments at home.  -  No interventions at this time    #PMH Sacral Wound  Sacral Wounds noted on 01/2024 hospital admission. Unclear if resolved.  - WOCN consulted for high risk skin assessment Yes.  - cont pressure mitigating precautions per skin policy    Prophylaxis/LDA/Restraints/Consults   ICU Checklist completed: yes (see ICU rounding navigator in Epic)      Patient Lines/Drains/Airways Status       Active Active Lines, Drains, & Airways       Name Placement date Placement time Site Days    ETT  7 --  --  -- --    Introducer 05/04/24 Internal jugular Right 05/04/24  0500  Internal jugular  less than 1    CVC Triple Lumen 05/04/24 Left Femoral 05/04/24  0500  Femoral  less than 1    Urethral Catheter 05/04/24  0500  --  less than 1    Peripheral IV 05/04/24 Anterior;Left;Upper Arm 05/04/24  0500  Arm  less than 1    Peripheral IV 05/04/24 Anterior;Left Forearm 05/04/24  0500  Forearm  less than 1    Peripheral IV 05/04/24 Anterior;Right;Upper Arm 05/04/24  0500  Arm  less than 1                  Patient Lines/Drains/Airways Status       Active Wounds       None                    Goals of Care     Code Status: Full Code    Designated Healthcare Decision Maker: Her designated healthcare decision maker(s) is/are   HCDM (patient stated preference): Caudle,Ryan - Relative - [phone].   See HCDM section of Epic sidebar/storyboard or ACP tab in patient chart for details regarding active HCDMs and patient capacity for decision-making.      Subjective     HPI:  Ashley Briggs is a 58 y.o. female with past medical history significant for CAD c/b NSTEMI, CKD, NASH cirrhosis c/b ascites, hepatic encephalopathy, and esophageal varices s/p banding who presented to OSH with hematemesis 6 days s/p variceal banding. Underwent EGD at OSH which found lesions unamenable to banding and then underwent unsuccessful TIPS (c/f portal vein thrombus) c/b Vtach (with pulse) requiring defibrillation, transferred to [facility] MICU with 2 pressor shock for accelerated transplant workup.     History obtained from chart review due to patient's impaired mental status    ROS: Ten-point review of systems is obtained and is negative except as noted in HPI.    Allergies  No Known Allergies    Meds  No current facility-administered medications on file prior to encounter.     Current Outpatient Medications on File Prior to Encounter   Medication Sig    acetaminophen  (TYLENOL ) 325 MG tablet Take 2 tablets (650 mg total) by mouth two (2) times a day as needed for pain.    aspirin  (ECOTRIN) 81 MG tablet Take 1 tablet (81 mg total) by mouth nightly.    baclofen  (LIORESAL ) 5 mg Tab tablet Take 1 tablet (5 mg total) by mouth nightly.    blood sugar diagnostic Strp by Other route Three (3) times a day before meals. For glucometer brand preferred by patient    blood-glucose sensor (FREESTYLE LIBRE 3 SENSOR) Devi Use 1 sensor every 15 days    buPROPion  (WELLBUTRIN  XL) 150 MG 24 hr tablet Take 1 tablet (150 mg total) by mouth nightly.    canagliflozin  (INVOKANA )  300 mg Tab tablet Take 1 tablet (300 mg total) by mouth daily before breakfast.    carvedilol  (COREG ) 6.25 MG tablet Take 1 tablet (6.25 mg total) by mouth two (2) times a day. (Patient not taking: Reported on 04/26/2024)    chlorhexidine  (PERIDEX ) 0.12 % solution 15 mL by Mouth route daily as needed.    empty container Misc Use as directed to dispose of Cosentyx  pens.    furosemide  (LASIX ) 40 MG tablet Take 1 tablet (40 mg total) by mouth daily.    gabapentin  (NEURONTIN ) 300 MG capsule Take 1 capsule (300 mg total) by mouth nightly.    insulin  glargine (BASAGLAR , LANTUS ) 100 unit/mL (3 mL) injection pen Inject 0.36 mL (36 Units total) under the skin nightly. (Patient taking differently: Inject 0.6 mL (60 Units total) under the skin nightly.)    insulin  lispro (HUMALOG ) 100 unit/mL injection pen Inject 8 Units under the skin Three (3) times a day before meals.    lancets (ACCU-CHEK SOFTCLIX LANCETS) Misc Use as directed to test once daily.    levETIRAcetam  (KEPPRA ) 500 MG tablet Take 1.5 tablets (750 mg total) by mouth two (2) times a day.    lidocaine  HCL-menthoL (NERVIVE PAIN RELIEVING) 4-1 % srlo Apply topically.    magnesium  oxide (MAG-OX) 400 mg (241.3 mg elemental magnesium ) tablet Take 2 tablets (800 mg total) by mouth two (2) times a day.    metFORMIN  (GLUCOPHAGE -XR) 500 MG 24 hr tablet TAKE 2 TABLETS BY MOUTH IN THE MORNING AND 2 TABLETS IN THE EVENING WITH MEALS    midodrine (PROAMATINE) 10 MG tablet Take 0.5 tablets (5 mg total) by mouth.    mirtazapine  (REMERON ) 15 MG tablet Take 1 tablet (15 mg total) by mouth nightly.    naltrexone  (DEPADE) 50 mg tablet Take 1 tablet (50 mg total) by mouth nightly.    pen needle, diabetic (ULTICARE PEN NEEDLE) 32 gauge x 1/4 (6 mm) Ndle Inject 1 each under the skin nightly.    rifAXIMin  (XIFAXAN ) 550 mg Tab Take 1 tablet (550 mg total) by mouth two (2) times a day.    scopolamine  (TRANSDERM-SCOP) 1 mg over 3 days Place 1 patch (1 mg total) on the skin every third day.    secukinumab  (COSENTYX  PEN, 2 PENS,) 150 mg/mL PnIj injection Inject the contents of 2 pens (300 mg total) under the skin every twenty-eight (28) days. Maintenance dose    simvastatin  (ZOCOR ) 20 MG tablet Take 1 tablet (20 mg total) by mouth every evening.    spironolactone  (ALDACTONE ) 100 MG tablet Take 1 tablet (100 mg total) by mouth daily.    zinc sulfate (ZINCATE) 220 mg (50 mg elemental zinc) capsule Take 1 capsule (220 mg total) by mouth.    zolpidem  (AMBIEN ) 5 MG tablet Take 1 tablet (5 mg total) by mouth nightly as needed.       Past Medical History  Past Medical History:   Diagnosis Date    Anemia     Angular blepharoconjunctivitis of both eyes 12/21/2020    Anxiety     Arthritis     Ascites     Blepharitis     Calcified cerebral meningioma   08/16/2022    Cataract associated with type 2 diabetes mellitus   12/21/2020    Chronic headaches     Chronic pain     Chronic pain disorder 2029    I have multiple issues. Seeing doctor's regarding  issues    Cirrhosis  Coronary artery disease involving native heart without angina pectoris 02/05/2021    Current moderate episode of major depressive disorder 11/06/2022    Diabetes mellitus       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 type 2 diabetes mellitus   12/02/2022    Dry eye syndrome, bilateral 12/21/2020    Dysphagia 02/02/2023    Endometrial cancer   2007    Esophageal varices in cirrhosis   06/17/2022    Fractures 01/29/2018    Gastroparesis     Generalized pruritus 02/02/2023    GERD (gastroesophageal reflux disease) It's  been going on for years    I take medication  which helps tremendously    Glaucoma 2023    I see a doctor on a regular basis    H/O diabetic gastroparesis 01/09/2021    Heart disease     Hemorrhage of rectum and anus 03/17/2011    Hepatic cirrhosis   06/17/2022    Hepatic encephalopathy   06/17/2022    Hepatic steatosis 03/13/2021    Noted on CT Fall 2021    High cholesterol     History of encephalopathy     Hx of psoriasis 08/06/2022    Hyperlipidemia 12/12/2015    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    Liver disease     Major depression     Metabolic dysfunction-associated steatohepatitis (MASH)     Morbid obesity 11/10/2019    Myopia of both eyes with astigmatism and presbyopia 12/02/2022    Nausea 09/22/2022    Non-alcoholic fatty liver disease     NPDR (nonproliferative diabetic retinopathy)       Right eye    Other ascites 09/22/2022    Other chronic pain 11/06/2022    Other insomnia 11/06/2022    PFD (pelvic floor dysfunction) 04/07/2013    Portal hypertension   10/08/2022    Psoriasis     Ptosis of eyelid, left     Left upper eyelid    Reflux     Retinopathy of left eye, background, proliferative 01/09/2021    Right upper quadrant abdominal pain 09/22/2022    Secondary esophageal varices without bleeding   06/17/2022    Type 2 diabetes mellitus, with long-term current use of insulin    12/12/2015    Last Assessment & Plan:   The current medical regimen is effective;  continue present plan and medications.  Patient will continue good management of diabetes especially with weight loss. Hopefully with further weight loss will run into the problem of being overmedicated and having to cut back on medications     Some confusion on diabetes medications was have not.been refilled for over a year.  Pa       Past Surgical History  Past Surgical History:   Procedure Laterality Date    CHOLECYSTECTOMY      HYSTERECTOMY      endometrial cancer    HYSTERECTOMY      OOPHORECTOMY      OTHER SURGICAL HISTORY  Hysterectomy was in 2007. Gallbladder was in 1999 and phrenectomy  was in 2024    Hysterectomy,  gallbladder removed and phrenectomy    PR ANAL PRESSURE RECORD Left 03/31/2013    Procedure: ANORECTAL MANOMETRY;  Surgeon: None None;  Location: [facility];  Service: Gastroenterology    PR BREATH HYDROGEN TEST N/A 03/31/2013    Procedure: BREATH HYDROGEN TEST;  Surgeon: None None;  [facility]: [facility];  Service: Gastroenterology    PR COLSC FLX W/RMVL OF TUMOR POLYP LESION SNARE TQ N/A 09/06/2020    Procedure: COLONOSCOPY FLEX; W/REMOV TUMOR/LES BY SNARE;  Surgeon: Benedetto Brady, MD; Location: [facility];  Service: Gastroenterology    PR UPPER GI ENDOSCOPY,BIOPSY N/A 04/15/2013    Procedure: UGI ENDOSCOPY; WITH BIOPSY, SINGLE OR MULTIPLE;  Surgeon: Allene Ards, MD;  Location: [facility];  Service: Gastroenterology    PR UPPER GI ENDOSCOPY,BIOPSY N/A 09/06/2020    Procedure: UGI ENDOSCOPY; WITH BIOPSY, SINGLE OR MULTIPLE;  Surgeon: Benedetto Brady, MD;  [facility]: [facility];  Service: Gastroenterology    PR UPPER GI ENDOSCOPY,BIOPSY N/A 05/05/2022    Procedure: UGI ENDOSCOPY; WITH BIOPSY, SINGLE OR MULTIPLE;  Surgeon: Benedetto Brady, MD;  Location: [facility];  Service: Gastroenterology    PR UPPER GI ENDOSCOPY,BIOPSY N/A 01/22/2023    Procedure: UGI ENDOSCOPY; WITH BIOPSY, SINGLE OR MULTIPLE;  Surgeon: Celestia Colander, MD;  Location: [facility];  Service: Gastroenterology       Family History  Family History   Problem Relation Age of Onset    Diabetes Mother     Cancer Mother         Complications during dialysis    Depression Mother     Hypertension Mother     Vision loss Mother     No Known Problems Father     No Known Problems Brother     Diabetes Maternal Grandmother     Hypertension Maternal Grandmother     Alcohol abuse Maternal Grandmother         Heavy drinker    Arthritis Maternal Grandmother     Cancer Maternal Grandfather     Diabetes Maternal Grandfather     Alcohol abuse Maternal Grandfather     Heart disease Maternal Grandfather     No Known Problems Paternal Grandmother     Skin cancer Paternal Grandfather     Stroke Paternal Grandfather     Vision loss Maternal Aunt         Heart Attack    No Known Problems Maternal Uncle     No Known Problems Paternal Aunt     No Known Problems Paternal Uncle     No Known Problems Other     Melanoma Neg Hx     Basal cell carcinoma Neg Hx     Squamous cell carcinoma Neg Hx     Substance Abuse Disorder Neg Hx     Drug abuse Neg Hx     Mental illness Neg Hx     Amblyopia Neg Hx     Blindness Neg Hx     Cataracts Neg Hx     Glaucoma Neg Hx     Macular degeneration Neg Hx     Retinal detachment Neg Hx     Strabismus Neg Hx     Thyroid disease Neg Hx     Breast cancer Neg Hx        Social History  Social History     Socioeconomic History    Marital status: Widowed   Tobacco Use    Smoking status: Never     Passive exposure: Current    Smokeless tobacco: Never   Vaping Use    Vaping status: Never Used   Substance and Sexual Activity    Alcohol use: Not Currently     Comment: No longer  Drug use: Not Currently     Types: Marijuana, Codeine, Hydrocodone , Morphine , Oxycodone     Sexual activity: Not Currently     Partners: Male     Birth control/protection: Abstinence   Other Topics Concern    Do you use sunscreen? Yes    Tanning bed use? No    Are you easily burned? Yes    Excessive sun exposure? No    Blistering sunburns? Yes     Comment: When I was in my teens and twenties   Social History Narrative    Widowed    No children    Not currently working:  Previously worked as a Engineer, site     Social Drivers of Psychologist, prison and probation services Strain: Low Risk  (12/16/2023)    Overall Financial Resource Strain (CARDIA)     Difficulty of Paying Living Expenses: Not very hard   Food Insecurity: Food Insecurity Present (04/19/2024)    Received from [facility]    Hunger Vital Sign     Worried About Running Out of Food in the Last Year: Never true     Ran Out of Food in the Last Year: Sometimes true   Transportation Needs: Unmet Transportation Needs (04/19/2024)    Received from [facility] - Transportation     Lack of Transportation (Medical): Yes     Lack of Transportation (Non-Medical): Yes   Physical Activity: Insufficiently Active (01/09/2021)    Exercise Vital Sign     Days of Exercise per Week: 1 day     Minutes of Exercise per Session: 40 min   Stress: Stress Concern Present (09/17/2022)    [employer] of Occupational Health - Occupational Stress Questionnaire     Feeling of Stress : Very much   Social Connections: Socially Isolated (04/19/2024)    Received from [facility]    Social Connection and Isolation Panel [NHANES]     Frequency of Communication with Friends and Family: Once a week     Frequency of Social Gatherings with Friends and Family: Twice a week     Attends Religious Services: Never     Database administrator or Organizations: No     Attends Banker Meetings: Never     Marital Status: Widowed   Housing: Unknown (04/29/2024)    Received from [facility]    Housing Stability Vital Sign     Homeless in the Last Year: No           Objective     Vitals - past 24 hours  Temp:  [36.3 ??C (97.3 ??F)] 36.3 ??C (97.3 ??F)  Pulse:  [120-123] 121  SpO2 Pulse:  [120-127] 121  Resp:  [16-17] 17  FiO2 (%):  [80 %-100 %] 80 %  SpO2:  [100 %] 100 % Intake/Output  I/O last 3 completed shifts:  In: -   Out: 50 [Urine:50]     Physical Exam:    General: Ill appearing jaundiced female, lying in bed.  HEENT: mucous membranes dry  CV: tachycardic, regular rhythm. Pulses weak but palpable peripherally. Extremities cool to touch.  Pulm: CTAB on anterior exam  GI: Abdominal   MSK: trace peripheral edema  Skin: Jaundiced. Diffuse scattered erythematous plaques with silvery scale.   Neuro: intubated and sedated        Body mass index is 33.59 kg/m??.            Wt Readings from Last  12 Encounters:   05/04/24 88.8 kg (195 lb 12.3 oz)   04/26/24 87.3 kg (192 lb 8 oz)   01/04/24 88.5 kg (195 lb)   12/17/23 88.5 kg (195 lb 1.6 oz)   12/07/23 89.4 kg (197 lb)   11/21/23 93.1 kg (205 lb 3.2 oz)   11/06/23 94.3 kg (207 lb 12.8 oz)   11/05/23 96.2 kg (212 lb)   11/02/23 96.2 kg (212 lb)   10/26/23 95.8 kg (211 lb 1.6 oz)   10/16/23 97.7 kg (215 lb 6.2 oz)   10/12/23 93.9 kg (207 lb)       Continuous Infusions:    NORepinephrine bitartrate-NS 28 mcg/min (05/04/24 0642)    octreotide infusion 50 mcg/hr (05/04/24 0746)    propofol  10 mg/mL infusion 45 mcg/kg/min (05/04/24 0643)    vasopressin 0.03 Units/min (05/04/24 0642)       Scheduled Medications:    insulin  lispro  0-20 Units Subcutaneous ACHS    levETIRAcetam   750 mg Intravenous Q12H SCH    pantoprazole  (Protonix ) intravenous solution  40 mg Intravenous BID    piperacillin-tazobactam (ZOSYN) IV (intermittent)  4.5 g Intravenous Q6H    vancomycin  1,250 mg Intravenous Q12H       PRN medications:  dextrose  in water , glucagon, glucose    Data/Imaging Review: Reviewed in Epic and personally interpreted on 05/04/2024. See EMR for detailed results.

## 2024-05-04 NOTE — Unmapped (Signed)
 [facility], [place], [place]    Note of Inpatient Transfer Acceptance for Admitting Service    05/04/2024    I requested acceptance of Ashley Briggs (DOB: [birth year]) for transfer from [facility]    Patient is known to the [facility]: yes - Dr. Rolande Cleverly    Patient is listed or in evaluation for transplant: yes - Referred for Evaluation    Reason for transfer: Refractory Variceal Hemorrhage    Note: she is a 57 y.o. 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 She recently underwent esophageal banding by her primary GI (Dr. Ole Berkeley) on 04/28/24 and then presented yesterday to [facility] with hematemesis. EGD yesterday (05/03/24) showed bleeding esophageal ulcers not amendable to banding. She was monitored in the ICU for her bleeding and then developed hypotension and on-going bleeding. Emergent TIPS was attempted, but could not be performed successfully despite use of ICE secondary to inability to puncture into the portal vein from the hepatic vein. Of note, she did have monomorphic VT during the procedure requiring defibrillation, but she did not lose her pulse during the procedure.     She currently is in the ICU with a rising lactate (> 9) and last Hgb was 6.1. She received 3 units of pRBC this evening with 2 units of FFP. She is currently on Levo at 28 mcg/min and vaso 0.04. Her pressor requirement is improving and there is no obvious hematemesis right now. She has no enteral access at this time given concern for variceal bleeding. She is on mechanical ventilation at 100% FiO2.     Plan: Accept to [facility] ICU for refractory portal hypertensive bleeding and need for urgent transplant evaluation. The concern currently is that she has altered anatomy to allow for TIPS or a portal venous thrombosis that is making it difficult to perform TIPS. As such, it will be a priority to identify such anatomical issues. Will also need to work up concurrently for urgent liver transplant. Plan outlined as below:    1) Repeat CBC q4-6 hours and maintain Hgb > 7.   2) Do not give routine FFP or PLT. Would rather utilize QStat q6 hrs to determine if she requires product. Alternatively can check fibrinogen and maintain > 100 with cryo   3) Low threshold to place [place]  Tube Darius Edouard) at bedside if she has worsening pressor requirement or on-going bleeding  4) Continue octreotide at 100 mcg/min (increased dose)  5) Continue Ceftriaxone  prophylaxis   6) Send surveillance BCx, UA + UCx and obtain diagnostic paracentesis for cell count/culture if possible   7) Obtain Stat U/S Liver Doppler. Also obtain OSH imaging from 5/17 to review PV anatomy. If unrevealing, low threshold to obtain CT Abdomen W/Wo Contrast  8) Obtain TTE given her monomorphic VT  9) Consult IR on arrival    Please contact the Hepatology Consult service upon transfer.  Page me if you have questions.    Andrea Banda Mason Sole, MD  Associate Professor of Medicine   [employer]  at [facility]  Division of Gastroenterology & Hepatology  Pager: [phone]

## 2024-05-04 NOTE — Unmapped (Addendum)
 Ashley Briggs is a 58 y.o. female w/ PMH CAD c/b NSTEMI, CKD, NASH cirrhosis c/b ascites, hepatic encephalopathy, and esophageal varices s/p banding who presented to OSH with hematemesis 6 days s/p variceal banding. Underwent EGD at OSH which found lesions unamenable to banding and then underwent unsuccessful TIPS (c/f portal vein thrombus) c/b Vtach (with pulse) requiring defibrillation, transferred to [facility] MICU with 2 pressor shock for accelerated transplant workup. Prolonged hospital course complicated by septic and hypovolemic shock, acute hypoxic respiratory failure, acute renal failure.  Patient ultimately went comfort care due to worsening refractory shock and was ultimately pronounced dead on 06/01/24.  Hospital course is outlined by problem below.     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 She initially presented to OSH with hematemesis 6 days s/p esophageal banding. Emergent TIPS was attempted at OSH but unsuccessful. She developed shock requiring 2 pressors and was transferred to [facility] MICU for urgent transplant evaluation. EGD on 6/4 demonstrated nonbleeding esophageal ulcers at sites of prior banding, but the stomach was completely occluded with blood. She acutely worsened on 6/4 requiring a fourth pressor, aggressive fluid resuscitation, and massive transfusion protocol. Bedside attempts to place a [place] tube were unsuccessful and she ultimately underwent salvage TIPS on 6/4. Course was complicated by hepatic encephalopathy and ischemic hepatitis. Was ultimately weaned from pressors by 6/14 and stabilized from a hemorrhagic shock perspective. However unfortunately due to newly reduced ejection fraction on TTE she was not a transplant candidate. She then continued to decline and developed septic shock likely from aspiration pneumonia as further characterized below.    Aspiration pneumonia  Presented to [facility] MICU in 2-pressor shock with leukocytosis to 24.9, infectious source never identified.. With fever to 38.4 on 6/6. Paracentesis with PMNs < 250. Thoracentesis transudative. UA noninfectious. Lrcx gram stain with 1+ yeast, 1+ GPC. Fluctuating single pressor requirement since 6/9, repeat infectious workup negative. Had possible aspiration event overnight into 6/15 for which Zosyn  was resumed and completed a treatment course of 7 days.  As her clinical picture worsened and she had worsening respiratory failure, redosed her antibiotics though unfortunately continued to decline.  Suspect that her ongoing multi pressor shock was septic in nature from underlying pneumonia.    Intubation and mechanical ventilation  Arrived to [facility] MICU intubated for altered mental status. She was extubated 6/5, requiring reintubation later the same day for worsening hypoxia 2/2 pulmonary edema likely cardiogenic in the setting of stress cardiomyopathy. She was diuresed with improving respiratory status. Ultimately extubated on 6/11 and weaned to RA. Patient again intubated on 6/20 overnight due to increasing oxygen requirements refractory to positive pressure and thoracentesis.  POCUS with diffuse B-lines concerning for volume overload, CXR with worsening moderate to severe pulmonary edema versus infectious process.  She remained intubated until palliative extubation on 06-01-2024.    Stress cardiomyopathy - Demand ischemia   Troponin on admission elevated to 5.4k attributed to demand ischemia. Echo on admission with EF normal at >55%. Developed wide complex tachycardia on 6/5, without evidence of acute ischemia on EKG. Troponin peaked at 23k (6/6). TTE was c/w stress cardiomyopathy, EF reduced to 45%. Repeat TTE on 6/11 with EF reduced to 40%.     Wide complex tachycardia - SVT with abberancy   Developed VT arrest requiring shock at OSH with initial TIPS attempt. On 6/5 developed  several episodes of wide complex tachycardia. She was stabilized with amiodarone  bolus and completed amio load. Cardiology was consulted and favored WCT to represent SVT with abberancy 2/2 critical illness rather than VT. Received amnio load though no further antiarrhythmic.    Acute kidney injury - acute renal failure  Had course of Cr elevation to 1.6 from baseline ~0.8 around 6/7. Urine lytes most consistent with prerenal etiology at that time, 2/2 hypoperfusion iso shock, unlikely HRS. Cr normalized, adequate UOP, but worsened again 6/20 with concomitant rise in bilirubin. Concern is for pre-renal injury again given our lax BP goal of SBP >85.  Despite aggressive diuresis with Lasix  160 mg, patient had minimal urine output and so the decision was made to pursue CRRT for volume removal.      Insulin -dependent T2DM  A1c 5.6 on admission. Home regimen lantus  60, lispro with meals. BG control challenging in the setting of critical illness and stress dose steroids. S/p insulin  infusion, converted to NPH 6/8.  Continuing to titrate insulin  while off continuous tube feeds and p.o. only.  Will switch back to NPH postintubation and resuming tube feeds for better titration.  Insulin  discontinued and decision was made to pursue comfort measures.

## 2024-05-04 NOTE — Unmapped (Shared)
 MICU Daily Progress Note     Date of Service: 05/04/2024    Problem List:   Principal Problem:    Shock    Active Problems:    Type 2 diabetes mellitus, with long-term current use of insulin       Hyperlipidemia    Hypertension    Coronary artery disease involving native heart without angina pectoris    Diabetes mellitus      Esophageal varices in cirrhosis      Hepatic encephalopathy      Calcified cerebral meningioma      Portal hypertension      Other chronic pain    OSA (obstructive sleep apnea)    Stage 3a chronic kidney disease    Hematemesis      Interval history: Ashley Briggs is a 58 y.o. female with NASH cirrhosis (c/b ascites, hepatic encephalopathy, esophageal varices), CAD with prior NSTEMI, CKD who presented to OSH with hematemesis 6 days s/p variceal banding. EGD at OSH revealed esophageal ulcers. She became hypotensive and emergent TIPS was attempted, but not successful (c/f portal vein thrombus, c/b Vtach with pulse requiring shock). She was transferred to [facility] MICU with 2 pressor shock for accelerated transplant evaluation.     24hr events:   - Increasing pressor requirement, now requiring norepi, vaso, phenyl with MAPs 50s   - Started stress dose corticosteroids  - Hgb 9.0 -> 6.4 over 3 hours, received 2 units prbcs, cordis placed      Neurological   Sedation  Intubated for procedure and altered mental status. Sedated with propofol  gtt.   - Wean propofol  as tolerated, RASS goal 0 to -1  - Triglycerides q96h while on propofol  gtt  - Daily SAT    History of seizure-like activity  - Continue IV equivalent of home Keppra  750mg  BID    Chronic pain  History of chronic pain, treated with baclofen , naltrexone , oxycodone .   - Hold home baclofen , naltrextone, oxycodone  in the setting of hypotension and sedation    Major depressive disorder  - Hold home bupoprion, citalopram     Insomnia  - Hold home zolpidem     PMH calcified cerebral meningioma  - no intervention at this time    Analgesia: pain adequately controlled  RASS at goal? Yes  [place] Agitation Assessment Scale (RASS): -1 (05/04/24 7:59am)    Pulmonary   Intubation and mechanical ventilation  Patient intubated for mental status and mechanically ventilated. Will wean propofol  and consider extubating once mental status improved.   - Trend ABG  - Sedation as above    FiO2 (%):  [80 %-100 %] 80 %  PR SUP:  [5 cm H20] 5 cm H20  O2 Device: Ventilator    Left pleural effusion  CXR 6/4 remarkable for moderate Left pleural effusion with passive atelectasis. With recent vomiting and EGD, isolated left pleural effusion esophageal rupture is considered. Pending coagulation studies, would consider thoracentesis to further characterize pleural effusion.     Cardiovascular   Undifferentiated shock, septic versus hemorrhagic   Requiring 2 pressor support on arrival to [facility] MICU. Etiology of shock septic versus hemorrhagic. Leukocytosis to 29, lactate elevated to 10.9. UA noninfectious. Increasing pressor requirement on 6/4, requiring norepinephrine, vasopressin, and phenylephrine  with MAPs 50s. S/p 3L IVFs. Rapidly declining Hb from 9.0 to 6.4, suggestive of ongoing blood loss, though abd is soft, no longer having hematemesis, no blood per rectum.   - Continue norepinephrine, vasopressin, phenylephrine   - Hydrocortisone 50mg  q6h, PPI ppx while on stress  dose steroids  - Trend ABG, lactate  - Continue vanc/zosyn for broad coverage  - Bcx, Ucx pending    Ventricular tachycardia  Developed monomorphic VT during TIPS procedure at outside hospital requiring shock, though she did not lose pulse. Possibly 2/2 ischemic scar ISO CAD history. Will trend troponin, pressors may be masking underlying cardiac function.   - EKG  - Trend troponin  - TTE  - Daily CMP  - Replete K>4, Mg >2    CAD, prior NSTEMI  Prior NSTEMI per chart review. No LHC data available. Unclear if she has history of coronary stent.   - Home aspirin  held ISO bleeding    Renal   AKI  Cr on admission 1.29, elevated from baseline ~0.8. Likely prerenal 2/2 poor renal perfusion ISO shock.   - Daily CMP  - Management of shock as above    Infectious Disease/Autoimmune   Undifferentiated shock, c/f septic shock - Leukocytosis  Infectious sources considered include SBP, SSTI. Would consider CT to further narrow infectious source once CT can be tolerated from a hemodynamic standpoint. Given unclear infectious source, favor additional gram negative coverage with single dose of tobramycin.   - Bcx pending, Ucx pending  - Vanc/zosyn  - Tobramycin x 1    Cultures:  No results found for: BLOOD CULTURE, BLOOD CULTURE, ROUTINE, URINE CULTURE, COMPREHENSIVE, URINE CULTURE, COMPREHENSIVE, LOWER RESPIRATORY CULTURE  WBC (10*9/L)   Date Value   05/04/2024 19.7 (H)     WBC, UA (/HPF)   Date Value   05/04/2024 1            FEN/GI   Refractory portal hypertensive bleeding - Decompensated cirrhosis - Ascites - Hx hepatic encephalopathy  Hx of NASH cirrhosis presenting to OSH with hematemsis 6 days after esophageal variceal banding. EDG at OSH demonstrated bleeding esophageal ulcers. Emergent TIPS not successful due to inability to access the portal vein, raising concern for portal vein thrombus.   - Hepatology c/s, appreciate recs   - IV octreotide   - Maintain fibrinogen > 100 with cryo   - Diagnostic paracentesis   - STAT liver doppler   - TTE  - VIR consult for TIPS consideration  - Octreotide 100mcg bolus f/b 50mcg/hr  - Daily MELLD labs (CMP, PT/INR)  - Home lasix , spironolactone  held  - Home rifaxamin, lactulose  held    Malnutrition Assessment:   Body mass index is 33.59 kg/m??.BMI Interpretation: >/= 30 and < 40, consistent with obesity, clinically significant requiring additional resources and complicating multiple aspects of patient care.  GLIM criteria:              Pt does not meet criteria  -I have screened this patient for malnutrition and they did NOT meet criteria for malnutrition based on GLIM criteria.  -Nutrition consulted no       Heme/Coag   Blood loss anemia  S/p 3 units pRBCs at OSH, Hb 9.3. Ongoing decline in Hb, requiring continued transfusions.   - Daily CBC with diff  - Management of GI bleed as above    Endocrine   Insulin -dependent T2DM  Last A1c 7.4 in 11/2023. Home regimen lantus  60, lispro with meals.   - NPH 15 units BID  - SSI  - q6h BG checks    Integumentary   Psoriasis  History of psoriasis per chart review. With numerous chronic appearing scattered erythematous plaques with silvery scale. Of note, on Secukinumab  for psoriasis management, which can increase risk for infection.    #  Sacral wound, 01/2024, unclear if still present  - WOCN consulted for high risk skin assessment Yes.  - cont pressure mitigating precautions per skin policy    Prophylaxis/LDA/Restraints/Consults   ICU checklist completed: yes (see ICU rounding navigator in Epic)    Patient Lines/Drains/Airways Status       Active Active Lines, Drains, & Airways       Name Placement date Placement time Site Days    ETT  7 --  --  -- --    Introducer 05/04/24 Internal jugular Right 05/04/24  0500  Internal jugular  less than 1    Introducer 05/04/24 Femoral Left 05/04/24  1314  Femoral  less than 1    CVC Triple Lumen 05/04/24 Left Femoral 05/04/24  0500  Femoral  less than 1    Urethral Catheter 05/04/24  0500  --  less than 1    Peripheral IV 05/04/24 Anterior;Left;Upper Arm 05/04/24  0500  Arm  less than 1    Peripheral IV 05/04/24 Anterior;Left Forearm 05/04/24  0500  Forearm  less than 1    Peripheral IV 05/04/24 Anterior;Right;Upper Arm 05/04/24  0500  Arm  less than 1    Arterial Line 05/04/24 Right Femoral 05/04/24  0800  Femoral  less than 1                  Patient Lines/Drains/Airways Status       Active Wounds       None                    Goals of Care     Code Status: Full Code    Designated Healthcare Decision Maker:  Ms. Sieloff current decisional capacity for healthcare decision-making is Full capacity. Her designated healthcare decision maker(s) is/are   HCDM (patient stated preference): Caudle,Ryan - Relative - [phone].      Subjective     Denies pain this morning. RASS -1.     Objective     Vitals - past 24 hours  Temp:  [36 ??C (96.8 ??F)-36.8 ??C (98.2 ??F)] 36.8 ??C (98.2 ??F)  Pulse:  [116-130] 120  SpO2 Pulse:  [116-130] 120  Resp:  [16-23] 17  FiO2 (%):  [40 %-100 %] 40 %  SpO2:  [97 %-100 %] 100 % Intake/Output  I/O last 3 completed shifts:  In: -   Out: 50 [Urine:50]     Physical Exam:    General: Ill-appearing female, lying in bed. Intubated and sedated.   HEENT: Mucous membranes dry.  CV: Tachycardic. Regular rhythm.   Pulm: CTAB on anterior exam.  GI: Abd distended but not firm. Nontender.  Extremities: Pulses weak but palpable. Trace peripheral edema.   Skin: Diffuse scattered erythematous plaques with silvery scale.   Neuro: Intubated and sedated.    Continuous Infusions:    NORepinephrine bitartrate-NS 36 mcg/min (05/04/24 1313)    octreotide infusion 50 mcg/hr (05/04/24 1200)    phenylephrine  HCl in 0.9% NaCl 200 mcg/min (05/04/24 1200)    propofol  10 mg/mL infusion Stopped (05/04/24 1006)    vasopressin 0.03 Units/min (05/04/24 1200)       Scheduled Medications:    fentaNYL  (PF)        hydrocortisone sod succ  50 mg Intravenous Q6H SCH    insulin  lispro  0-20 Units Subcutaneous ACHS    insulin  NPH  15 Units Subcutaneous Q12H [facility]    levETIRAcetam   750 mg Intravenous Q12H SCH    pantoprazole  (Protonix ) intravenous  solution  40 mg Intravenous BID    piperacillin-tazobactam  4.5 g Intravenous Q8H SCH       PRN medications:  dextrose  in water , fentaNYL  (PF), fentaNYL  (PF) **OR** fentaNYL  (PF), fentaNYL  (PF), glucagon, glucose, midazolam    Data/Imaging Review: Reviewed in Epic and personally interpreted on 05/04/2024. See EMR for detailed results.       Anissa Powell, MS4

## 2024-05-04 NOTE — Unmapped (Signed)
 Vancomycin Therapeutic Monitoring Pharmacy Note    Genee Rann is a 58 y.o. female starting vancomycin. Date of therapy initiation: 05/04/24    Indication: Intra-abdominal Infection    Prior Dosing Information: None/new initiation     Goals:  Therapeutic Drug Levels  Vancomycin trough goal: 10-15 mg/L    Additional Clinical Monitoring/Outcomes  Renal function, volume status (intake and output)    Results: Not applicable    Wt Readings from Last 1 Encounters:   05/04/24 88.8 kg (195 lb 12.3 oz)     Creatinine   Date Value Ref Range Status   04/26/2024 0.78 0.55 - 1.02 mg/dL Final   53/66/4403 4.74 (H) 0.55 - 1.02 mg/dL Final   25/95/6387 5.64 (H) 0.55 - 1.02 mg/dL Final        Pharmacokinetic Considerations and Significant Drug Interactions:  Adult (estimated initial): Vd = 61.983 L, ke = 0.0777 hr-1  Concurrent nephrotoxic meds: Zosyn    Assessment/Plan:  Recommendation(s)  Start vancomycin 1250 mg IV every 12 hours  Estimated trough on recommended regimen: 14 mg/L    Follow-up  Level due: prior to fourth or fifth dose  A pharmacist will continue to monitor and order levels as appropriate    Please page service pharmacist with questions/clarifications.    Banita Lehn, [facility]

## 2024-05-04 NOTE — Discharge Summary (Signed)
 Physician Discharge Summary  Patient ID: Abigail Molina MRN: [id number] DOB/AGE: [birth year] 58 y.o.  Admit date: 05/03/2024 Discharge date: 05/04/2024  Admission Diagnoses: Variceal bleeding s/t decompensated Liver Cirrhosis s/t to NASH  Discharge Diagnoses:  Principal Problem:   Esophageal varices with bleeding in diseases classified elsewhere [facility]) Active Problems:   Diabetes (HCC)   Liver cirrhosis secondary to NASH (HCC)   AKI (acute kidney injury) (HCC)   Hyperkalemia   Shock circulatory (HCC)   Anxiety and depression   Hematemesis   Acute blood loss anemia (ABLA)   Discharged Condition: critical  Hospital Course: 58 yo F presenting to [facility] ED from home via EMS on 05/03/24 for evaluation of hematemesis.   History provided per chart review and patient bedside report. Patient in her normal state of health until waking up on 05/03/24, she reported sudden nausea and hematemesis- bright red blood with clots. She then had 3-4 more episodes of moderate hematemesis. She also endorsed feeling light-headed. She denied chest pain, dyspnea, abdominal pain, melena. EMS was called, finding the patient hypotensive with sbp in the 80's. 500 mL bolus administered.   Of note patient seen in April 2025 with melena, EGD revealed G3 varices which were banded. She was instructed to make an appointment at [facility], where she receives her healthcare and is being monitored for potential liver transplant, to have additional banding done in 4 weeks. The patient unfortunately was unable to be seen within that timeframe and returned to [facility] on 04/28/24 for repeat banding. Patient also underwent recent large volume paracentesis in mid May removing 4 L.   ED course: Upon arrival, patient alert and responsive, with tachypnea/ mild tachycardia and marginal BP. LAbs significant for chronic hyponatremia, hypochloremia, hyperglycemia, mild NAGMA, AKI, Transaminitis, elevated Lipase and elevated INR at 1.5. GI consulted who  will take patient for EGD. TRH consulted for admission. Medications given: octreotide  bolus and infusion, Protonix  bolus 80 mg, Rocephin , NS bolus 500 mL, Zofran  IV Initial Vitals: 98.4, 22, 110, 96/54 & 100% on RA   Hospital Course: [facility] admitted patient to SDU. Patient went urgently for EGD that revealed esophageal ulcerations underneath banding done 5 days ago. Post procedure patient developed intermittent persistent small to moderate hematemesis with clots. She became hypotensive, vasopressors initiated and PCCM consulted. She received emergent blood and FFP. Collaborative discussions between TRH, GI, IR and PCCM. Patient eligible and amenable for a TIPS. Attempting to facilitate urgent TIPS overnight due to hypotensive and persistent hematemesis. Hgb dropped from 7.6 to 6.1 after a unit of pRBC's. Follow up labs significant for ABLA, hyperkalemia, worsening NAGMA & AKI, hyperglycemia, mildly elevated troponin and severe lactic acidosis with mild leukocytosis.  TIPS procedure unsuccessful. Post-procedure patient remains intubated and on mechanical ventilatory support. After intubation / induction patient flipped into monomorphic VT with a pulse. Defibrillated x 2. Suspect due to succinylcholine in the setting of hyperkalemia and prolonged Qtc.  Spoke with Dr. Jamelle Mcalpine and Dr. Mason Sole at [facility] with MICU and hepatology regarding urgent transfer for TIPS and liver transplant evaluation.  Consults: pulmonary/intensive care, GI, and interventional radiology  Significant Diagnostic Studies:   EKG Interpretation: Date: 05/03/24, EKG Time: 09:52, Rate: 110, Rhythm: ST, QRS Axis: borderline RAD Intervals: prolonged QT, ST/T Wave abnormalities: none, Narrative Interpretation: ST with prolonged QTc  Chemistry: Na+:133, K+: 5.6, BUN/Cr.: 36/ 1.29, Serum CO2/ AG: 17/ 15, glucose: 300 Hematology: WBC: 11.7, Hgb: 7.7 > 7.6 > 6.1,  Troponin: 7 > 23 > 137, Lactic: >9.0 VBG: 7.31/29/47/14.6 Upper  Endoscopy 05/03/24:  Grade III varices, 3 oozing esophageal ulcers found under recent banding. Hematin PCCM consulted for assistance in management and monitoring due to circulatory shock s/t ABLA from variceal bleeding in the setting of NASH on vasopressor support.  Treatments:  - Ceftriaxone  or SBP prophylaxis - Vitamin K, 3 u pRBC's & 2 u FFP - Protonix  80 mg bolus, 40 mg IV BID - Octreotide  bolus, followed by 50 mcg/h >> increased to 100 mcg/h - Keppra  - IV contrast - insulin / D50/ calcium / sodium bicarb for hyperkalemia - Mg and esmolol pushed during urgent defibrillation - 500 mL x 2 LR bolus - albumin  25 g x 2  Discharge Exam: Blood pressure (!) 151/62, pulse (!) 133, temperature 98.6 F (37 C), temperature source Axillary, resp. rate 16, height 5\' 4"  (1.626 m), weight 86.8 kg, last menstrual period 10/15/2016, SpO2 93%. General: Adult female, critically ill, lying in bed intubated & sedated requiring mechanical ventilation, NAD HEENT: MM pink/moist, anicteric, atraumatic, neck supple Neuro: sedated, unable to follow commands, PERRL +3, MAE CV: s1s2 RRR, ST on monitor, no r/m/g Pulm: Regular, non labored on PRVC 100% & PEEP 5, breath sounds clear-BUL & diminished-BLL GI: soft, distended, non tender, bs x 4 GU: foley in place  with clear yellow urine Skin: jaundice with scattered excoriations noted Extremities: warm/dry, pulses + 2 R/P, no edema noted  Disposition: Discharge disposition: 02-Transferred to [facility]        Allergies as of 05/04/2024   No Known Allergies      Medication List     STOP taking these medications    Accu-Chek Softclix Lancets lancets   aspirin  81 MG tablet   Baclofen  5 MG Tabs   BD Pen Needle Nano 2nd Gen 32G X 4 MM Misc Generic drug: Insulin  Pen Needle   BD Sharps Collector Misc   buPROPion  150 MG 24 hr tablet Commonly known as: WELLBUTRIN  XL   citalopram  20 MG tablet Commonly known as: CELEXA    Cosentyx Sensoready (300 MG) 150 MG/ML  Soaj Generic drug: Secukinumab (300 MG Dose)   FreeStyle Libre 3 Sensor Misc   furosemide  20 MG tablet Commonly known as: LASIX    gabapentin  300 MG capsule Commonly known as: NEURONTIN    insulin  lispro 100 UNIT/ML injection Commonly known as: HUMALOG   Insulin  Pen Needle 32G X 6 MM Misc   Invokana 300 MG Tabs tablet Generic drug: canagliflozin   lactulose  10 GM/15ML solution Commonly known as: CHRONULAC    Lantus  SoloStar 100 UNIT/ML Solostar Pen Generic drug: insulin  glargine   levETIRAcetam  500 MG tablet Commonly known as: KEPPRA    MAGnesium -Oxide 400 (240 Mg) MG tablet Generic drug: magnesium  oxide   metFORMIN  500 MG 24 hr tablet Commonly known as: GLUCOPHAGE -XR   midodrine  10 MG tablet Commonly known as: PROAMATINE    mirtazapine  15 MG tablet Commonly known as: REMERON    naltrexone 50 MG tablet Commonly known as: DEPADE   ondansetron  4 MG disintegrating tablet Commonly known as: ZOFRAN -ODT   oxyCODONE  5 MG immediate release tablet Commonly known as: Oxy IR/ROXICODONE    pantoprazole  40 MG tablet Commonly known as: PROTONIX    promethazine 12.5 MG tablet Commonly known as: PHENERGAN   rosuvastatin  20 MG tablet Commonly known as: CRESTOR    scopolamine 1 MG/3DAYS Commonly known as: TRANSDERM-SCOP   Slow Fe 142 (45 Fe) MG Tbcr Generic drug: Ferrous Sulfate    spironolactone  50 MG tablet Commonly known as: ALDACTONE    Stomach Relief Extra Strength 525 MG/15ML Susp Generic drug: Bismuth  Subsalicylate  thiamine  100 MG tablet Commonly known as: VITAMIN B1   Xifaxan  550 MG Tabs tablet Generic drug: rifaximin    Zinc  Sulfate 220 (50 Zn) MG Tabs   zolpidem  5 MG tablet Commonly known as: AMBIEN        - re-evaluate medication list at discharge from tertiary center  Signed: Recardo Canal Rust-Chester, AGACNP-BC Acute Care Nurse Practitioner [facility]   [phone] / [phone] Please see Amion for details.

## 2024-05-04 NOTE — Progress Notes (Signed)
 Air care here to take patient to [facility]. As report is being given, patient is taken off our bedside monitor and placed on their transport monitor. They have now taken over care of the patient and titration of medications.

## 2024-05-04 NOTE — ED Provider Notes (Signed)
 [facility] Department of Emergency Medicine   Code Blue CONSULT NOTE  Chief Complaint: Cardiac arrest/unresponsive   Level V Caveat: Unresponsive  History of present illness: I was contacted by the hospital for a CODE BLUE cardiac arrest upstairs and presented to the patient's bedside.   Patient Currently undergoing emergent TIPS procedure in interventional radiology when she lost pulses and a CODE BLUE was called.  ROS: Unable to obtain, Level V caveat  Scheduled Meds:  sodium chloride    Intravenous Once   sodium chloride    Intravenous Once   sodium chloride    Intravenous Once   sodium chloride    Intravenous Once   Chlorhexidine  Gluconate Cloth  6 each Topical Daily   insulin  aspart  0-15 Units Subcutaneous Q4H   insulin  glargine  10 Units Subcutaneous Daily   levETIRAcetam   750 mg Intravenous Q12H   lidocaine   1 patch Transdermal Q24H   pantoprazole  (PROTONIX ) IV  40 mg Intravenous Q12H   Continuous Infusions:  sodium chloride      cefTRIAXone  (ROCEPHIN )  IV     norepinephrine  (LEVOPHED ) Adult infusion 25 mcg/min (05/03/24 2131)   octreotide  (SANDOSTATIN ) 500 mcg in sodium chloride  0.9 % 250 mL (2 mcg/mL) infusion 50 mcg/hr (05/03/24 2246)   vasopressin  0.04 Units/min (05/03/24 2217)   PRN Meds:.diazepam, ondansetron  (ZOFRAN ) IV, prochlorperazine Past Medical History:  Diagnosis Date   Anemia    Anxiety    Arthritis    Cancer (HCC)    Endometrial cancer   Chronic pain    Cirrhosis of liver (HCC)    Depression    Diabetes mellitus without complication (HCC)    Fatty liver disease, nonalcoholic 2021   GERD (gastroesophageal reflux disease)    Glaucoma    Hyperlipidemia    Hypertension    Meningioma (HCC)    NSTEMI (non-ST elevated myocardial infarction) (HCC) 02/03/2024   OSA (obstructive sleep apnea) 05/08/2023   Overactive bladder    Pharmacologic therapy 11/02/2023   Psoriasis 2015   Past Surgical History:  Procedure Laterality Date    ABDOMINAL HYSTERECTOMY  2007   CHOLECYSTECTOMY  2000   ESOPHAGEAL BANDING  04/28/2024   Procedure: ESOPHAGOSCOPY, WITH ESOPHAGEAL VARICES BAND LIGATION;  Surgeon: [name], MD;  Location: [facility] ENDOSCOPY;  Service: Endoscopy;;   ESOPHAGOGASTRODUODENOSCOPY N/A 03/22/2024   Procedure: EGD (ESOPHAGOGASTRODUODENOSCOPY);  Surgeon: [name], MD;  Location: [facility] ENDOSCOPY;  Service: Endoscopy;  Laterality: N/A;   ESOPHAGOGASTRODUODENOSCOPY N/A 04/28/2024   Procedure: EGD (ESOPHAGOGASTRODUODENOSCOPY);  Surgeon: [name], MD;  Location: [facility] ENDOSCOPY;  Service: Endoscopy;  Laterality: N/A;   ESOPHAGOGASTRODUODENOSCOPY N/A 05/03/2024   Procedure: EGD (ESOPHAGOGASTRODUODENOSCOPY);  Surgeon: [name], MD;  Location: [facility] ENDOSCOPY;  Service: Endoscopy;  Laterality: N/A;   Social History   Socioeconomic History   Marital status: Widowed    Spouse name: Not on file   Number of children: Not on file   Years of education: Not on file   Highest education level: Associate degree: academic program  Occupational History   Not on file  Tobacco Use   Smoking status: Never   Smokeless tobacco: Never  Vaping Use   Vaping status: Never Used  Substance and Sexual Activity   Alcohol  use: Not Currently    Comment:  Rarely. One or two drinks a year    Drug use: No   Sexual activity: Not Currently    Birth control/protection: Abstinence  Other Topics Concern   Not on file  Social History Narrative   PT is not working right  now. Pt worries about financial needs since her husband died in 06-01-24 and she has no income. She applied for Widow's benefits in July but has not heard any information and the application is still pending. If possible please inquire about application status.     Social Drivers of Corporate investment banker Strain: Low Risk  (12/16/2023)   Received from [facility]   Overall Financial Resource Strain (CARDIA)    Difficulty of Paying Living Expenses: Not very hard  Food  Insecurity: Food Insecurity Present (04/19/2024)   Hunger Vital Sign    Worried About Running Out of Food in the Last Year: Never true    Ran Out of Food in the Last Year: Sometimes true  Transportation Needs: Unmet Transportation Needs (04/19/2024)   PRAPARE - Administrator, Civil Service (Medical): Yes    Lack of Transportation (Non-Medical): Yes  Physical Activity: Insufficiently Active (01/09/2021)   Received from [facility]   Exercise Vital Sign    Days of Exercise per Week: 1 day    Minutes of Exercise per Session: 40 min  Stress: Stress Concern Present (09/17/2022)   Received from [facility] of Occupational Health - Occupational Stress Questionnaire    Feeling of Stress : Very much  Social Connections: Socially Isolated (04/19/2024)   Social Connection and Isolation Panel [NHANES]    Frequency of Communication with Friends and Family: Once a week    Frequency of Social Gatherings with Friends and Family: Twice a week    Attends Religious Services: Never    Database administrator or Organizations: No    Attends Banker Meetings: Never    Marital Status: Widowed  Intimate Partner Violence: Patient Declined (04/19/2024)   Humiliation, Afraid, Rape, and Kick questionnaire    Fear of Current or Ex-Partner: Patient declined    Emotionally Abused: Patient declined    Physically Abused: Patient declined    Sexually Abused: Patient declined   No Known Allergies  Last set of Vital Signs (not current) Vitals:   05/03/24 06/02/23 05/03/24 2309  BP:    Pulse: (!) 140   Resp: (!) 21   Temp: 98.5 F (36.9 C) 98.7 F (37.1 C)  SpO2: 98%       Physical Exam  Gen: unresponsive Cardiovascular: pulseless  Resp: apneic but intubated Previously by anesthesia and being oxygenated and ventilated Abd: distended Neuro: GCS 3, unresponsive to pain  Skin: mottled, pale, pallor  Procedures (when  applicable, including Critical Care time): Procedures   MDM / Assessment and Plan Patient received defibrillation by anesthesia and interventional radiologist just as I arrived to the lab.  This put her in a sinus rhythm with a palpable femoral pulse.  Patient remained unresponsive.  She already had a definitive airway and was being mechanically ventilated.  Recardo Canal Rust-Chester, ICU NP, arrived and assumed care for the patient together with the interventional radiology and anesthesia teams.  My assistance was not required.    Lynnda Sas, MD 05/04/24 [phone]

## 2024-05-04 NOTE — Procedures (Signed)
 Interventional Radiology Procedure Note  Procedure: unsuccessful attempt at TIPS 1650cc paracentesis    Complications: None  Estimated Blood Loss:  <100cc  Findings: Challenging venous anatomy for access despite ICE guidance and portal vein percutaneous target.  Unable to access portal system from the hepatic vein side to place a TIPS despite multiple needle passes.  Full report in pacs      M. Alyssa Jumper, MD

## 2024-05-04 NOTE — Progress Notes (Addendum)
 TIPS procedure unsuccessful. Post-procedure patient remains intubated and on mechanical ventilatory support. After intubation / induction patient flipped into monomorphic VT with a pulse. Defibrillated x 2. Suspect due to succinylcholine in the setting of hyperkalemia and prolonged Qtc.  Acute Hypoxic / Hypercapnic Respiratory Failure  - Ventilator settings: PRVC  8 mL/kg, 100% FiO2, 5 PEEP, continue ventilator support & lung protective strategies - Wean PEEP & FiO2 as tolerated, maintain SpO2 > 90% - Head of bed elevated 30 degrees, VAP protocol in place - Plateau pressures less than 30 cm H20  - Intermittent chest x-ray & ABG PRN - Daily WUA with SBT as tolerated  - Ensure adequate pulmonary hygiene  - bronchodilators PRN - PAD protocol in place: continue Fentanyl  IVP & Propofol  drip    Spoke with Dr. Jamelle Mcalpine and Dr. Mason Sole at [facility] with MICU and hepatology regarding urgent transfer for TIPS and liver transplant evaluation. Recommendations: - increase Octreotide  to 100 mcg/h - Hgb goal > 7 - [place] tube bedside if needed  [employer] and left a voicemail alerting her to the pending transfer    Abigail Molina, AGACNP-BC Acute Care Nurse Practitioner [facility]   [phone] / [phone] Please see Amion for details.

## 2024-05-04 NOTE — Anesthesia Procedure Notes (Signed)
 Procedure Name: Intubation Date/Time: 05/04/2024 11:58 PM  Performed by: Racheal Buddle, CRNAPre-anesthesia Checklist: Patient identified, Emergency Drugs available, Suction available and Patient being monitored Patient Re-evaluated:Patient Re-evaluated prior to induction Oxygen Delivery Method: Circle system utilized Preoxygenation: Pre-oxygenation with 100% oxygen Induction Type: IV induction, Cricoid Pressure applied and Rapid sequence Laryngoscope Size: Mac, 3 and McGrath Grade View: Grade II Tube type: Oral Tube size: 7.0 mm Number of attempts: 1 Airway Equipment and Method: Stylet Placement Confirmation: ETT inserted through vocal cords under direct vision, positive ETCO2 and breath sounds checked- equal and bilateral Secured at: 21 cm Tube secured with: Tape Dental Injury: Teeth and Oropharynx as per pre-operative assessment

## 2024-05-04 NOTE — Transfer of Care (Signed)
 Immediate Anesthesia Transfer of Care Note  Patient: Abigail Molina  Procedure(s) Performed: IR TIPS  Patient Location: PACU and ICU  Anesthesia Type:General  Level of Consciousness: Patient remains intubated per anesthesia plan  Airway & Oxygen Therapy: Patient remains intubated per anesthesia plan  Post-op Assessment: Report given to RN and Post -op Vital signs reviewed and stable  Post vital signs: Reviewed and stable  Last Vitals:  Vitals Value Taken Time  BP 151/62 05/04/24 0303  Temp    Pulse 142 05/04/24 0330  Resp 19 05/04/24 0330  SpO2 93 % 05/04/24 0330  Vitals shown include unfiled device data.  Last Pain:  Vitals:   05/03/24 2330  TempSrc: Axillary  PainSc:          Complications: No notable events documented.

## 2024-05-04 NOTE — Progress Notes (Signed)
 Took patient down to IR with transport. Patient drowsy but oriented x4. Report given at bedside to CRNA. Quad strength levo @ 28mcg/min, Vasopressin  @ 0.04units/min, octreotide  @ 50mcg/hr, Calcium  gluconate PB infusing, and 3rd unit of PRBC infusing. PRBC completed @ 2330. Assisted in moving patient over to the table, went back to ICU.

## 2024-05-04 NOTE — Progress Notes (Signed)
 Responded to Code Blue in IR during pt procedure-staff active in code-checked with MD and nurse-no family in ICU room or waiting room at this time. Please page if needs change

## 2024-05-05 LAB — TYPE AND SCREEN
ABO/RH(D): A POS
Antibody Screen: NEGATIVE
Unit division: 0
Unit division: 0
Unit division: 0
Unit division: 0
Unit division: 0

## 2024-05-05 LAB — BPAM RBC
Blood Product Expiration Date: 202507082359
Blood Product Expiration Date: 202507062359
Blood Product Unit Number: 202507082359
ISSUE DATE / TIME: 202506031839
ISSUE DATE / TIME: 202506032204
ISSUE DATE / TIME: 202506032250
ISSUE DATE / TIME: 202506040012
ISSUE DATE / TIME: 202507082359
ISSUE DATE / TIME: 202507082359
Unit Type and Rh: 202507082359
Unit Type and Rh: 202507082359
Unit Type and Rh: 202507082359
Unit Type and Rh: 202507082359
Unit Type and Rh: 6200
Unit Type and Rh: 6200
Unit Type and Rh: 6200
Unit Type and Rh: 6200
Unit Type and Rh: 6200

## 2024-05-05 LAB — PREPARE RBC (CROSSMATCH)

## 2024-05-05 LAB — PREPARE FRESH FROZEN PLASMA

## 2024-05-05 LAB — BPAM FFP
Blood Product Expiration Date: 202506062359
Blood Product Expiration Date: 202506062359
ISSUE DATE / TIME: 202506032025
ISSUE DATE / TIME: 202506040013
Unit Type and Rh: 202506062359
Unit Type and Rh: 6200
Unit Type and Rh: 6200

## 2024-05-05 LAB — HIGH SENSITIVITY TROPONIN I - SINGLE
HIGH SENSITIVITY TROPONIN I: 5463 ng/L (ref <=34)
HIGH SENSITIVITY TROPONIN I: 7157 ng/L (ref <=34)
HIGH SENSITIVITY TROPONIN I: 7380 ng/L (ref <=34)

## 2024-05-05 LAB — CBC W/ AUTO DIFF
BASOPHILS ABSOLUTE COUNT: 0 10*9/L (ref 0.0–0.1)
BASOPHILS ABSOLUTE COUNT: 0 10*9/L (ref 0.0–0.1)
BASOPHILS ABSOLUTE COUNT: 0 10*9/L (ref 0.0–0.1)
BASOPHILS ABSOLUTE COUNT: 0 10*9/L (ref 0.0–0.1)
BASOPHILS ABSOLUTE COUNT: 0 10*9/L (ref 0.0–0.1)
BASOPHILS ABSOLUTE COUNT: 0 10*9/L (ref 0.0–0.1)
BASOPHILS RELATIVE PERCENT: 0.1 %
BASOPHILS RELATIVE PERCENT: 0.2 %
BASOPHILS RELATIVE PERCENT: 0.1 %
BASOPHILS RELATIVE PERCENT: 0.2 %
BASOPHILS RELATIVE PERCENT: 0.1 %
BASOPHILS RELATIVE PERCENT: 0.2 %
EOSINOPHILS ABSOLUTE COUNT: 0 10*9/L (ref 0.0–0.5)
EOSINOPHILS ABSOLUTE COUNT: 0 10*9/L (ref 0.0–0.5)
EOSINOPHILS ABSOLUTE COUNT: 0 10*9/L (ref 0.0–0.5)
EOSINOPHILS ABSOLUTE COUNT: 0 10*9/L (ref 0.0–0.5)
EOSINOPHILS ABSOLUTE COUNT: 0 10*9/L (ref 0.0–0.5)
EOSINOPHILS ABSOLUTE COUNT: 0 10*9/L (ref 0.0–0.5)
EOSINOPHILS RELATIVE PERCENT: 0 %
EOSINOPHILS RELATIVE PERCENT: 0.1 %
EOSINOPHILS RELATIVE PERCENT: 0 %
EOSINOPHILS RELATIVE PERCENT: 0 %
EOSINOPHILS RELATIVE PERCENT: 0 %
EOSINOPHILS RELATIVE PERCENT: 0 %
HEMATOCRIT: 29.5 % — ABNORMAL LOW (ref 34.0–44.0)
HEMATOCRIT: 29.8 % — ABNORMAL LOW (ref 34.0–44.0)
HEMATOCRIT: 29.6 % — ABNORMAL LOW (ref 34.0–44.0)
HEMATOCRIT: 28.5 % — ABNORMAL LOW (ref 34.0–44.0)
HEMATOCRIT: 26.6 % — ABNORMAL LOW (ref 34.0–44.0)
HEMATOCRIT: 29.4 % — ABNORMAL LOW (ref 34.0–44.0)
HEMOGLOBIN: 10.2 g/dL — ABNORMAL LOW (ref 11.3–14.9)
HEMOGLOBIN: 10.4 g/dL — ABNORMAL LOW (ref 11.3–14.9)
HEMOGLOBIN: 10.3 g/dL — ABNORMAL LOW (ref 11.3–14.9)
HEMOGLOBIN: 10.1 g/dL — ABNORMAL LOW (ref 11.3–14.9)
HEMOGLOBIN: 9.3 g/dL — ABNORMAL LOW (ref 11.3–14.9)
HEMOGLOBIN: 10.2 g/dL — ABNORMAL LOW (ref 11.3–14.9)
LYMPHOCYTES ABSOLUTE COUNT: 0.9 10*9/L — ABNORMAL LOW (ref 1.1–3.6)
LYMPHOCYTES ABSOLUTE COUNT: 0.8 10*9/L — ABNORMAL LOW (ref 1.1–3.6)
LYMPHOCYTES ABSOLUTE COUNT: 0.6 10*9/L — ABNORMAL LOW (ref 1.1–3.6)
LYMPHOCYTES ABSOLUTE COUNT: 0.6 10*9/L — ABNORMAL LOW (ref 1.1–3.6)
LYMPHOCYTES ABSOLUTE COUNT: 0.5 10*9/L — ABNORMAL LOW (ref 1.1–3.6)
LYMPHOCYTES ABSOLUTE COUNT: 1 10*9/L — ABNORMAL LOW (ref 1.1–3.6)
LYMPHOCYTES RELATIVE PERCENT: 8.7 %
LYMPHOCYTES RELATIVE PERCENT: 7.2 %
LYMPHOCYTES RELATIVE PERCENT: 4.5 %
LYMPHOCYTES RELATIVE PERCENT: 4.3 %
LYMPHOCYTES RELATIVE PERCENT: 4.4 %
LYMPHOCYTES RELATIVE PERCENT: 6.7 %
MEAN CORPUSCULAR HEMOGLOBIN CONC: 34.4 g/dL (ref 32.0–36.0)
MEAN CORPUSCULAR HEMOGLOBIN CONC: 35.1 g/dL (ref 32.0–36.0)
MEAN CORPUSCULAR HEMOGLOBIN CONC: 34.7 g/dL (ref 32.0–36.0)
MEAN CORPUSCULAR HEMOGLOBIN CONC: 35.5 g/dL (ref 32.0–36.0)
MEAN CORPUSCULAR HEMOGLOBIN CONC: 34.8 g/dL (ref 32.0–36.0)
MEAN CORPUSCULAR HEMOGLOBIN CONC: 34.8 g/dL (ref 32.0–36.0)
MEAN CORPUSCULAR HEMOGLOBIN: 29.5 pg (ref 25.9–32.4)
MEAN CORPUSCULAR HEMOGLOBIN: 29.8 pg (ref 25.9–32.4)
MEAN CORPUSCULAR HEMOGLOBIN: 29.6 pg (ref 25.9–32.4)
MEAN CORPUSCULAR HEMOGLOBIN: 30 pg (ref 25.9–32.4)
MEAN CORPUSCULAR HEMOGLOBIN: 29.4 pg (ref 25.9–32.4)
MEAN CORPUSCULAR HEMOGLOBIN: 30 pg (ref 25.9–32.4)
MEAN CORPUSCULAR VOLUME: 85.7 fL (ref 77.6–95.7)
MEAN CORPUSCULAR VOLUME: 85 fL (ref 77.6–95.7)
MEAN CORPUSCULAR VOLUME: 85.3 fL (ref 77.6–95.7)
MEAN CORPUSCULAR VOLUME: 84.5 fL (ref 77.6–95.7)
MEAN CORPUSCULAR VOLUME: 84.5 fL (ref 77.6–95.7)
MEAN CORPUSCULAR VOLUME: 86.2 fL (ref 77.6–95.7)
MEAN PLATELET VOLUME: 7.3 fL (ref 6.8–10.7)
MEAN PLATELET VOLUME: 7.2 fL (ref 6.8–10.7)
MEAN PLATELET VOLUME: 7.8 fL (ref 6.8–10.7)
MEAN PLATELET VOLUME: 7.7 fL (ref 6.8–10.7)
MEAN PLATELET VOLUME: 7.7 fL (ref 6.8–10.7)
MEAN PLATELET VOLUME: 8.6 fL (ref 6.8–10.7)
MONOCYTES ABSOLUTE COUNT: 2 10*9/L — ABNORMAL HIGH (ref 0.3–0.8)
MONOCYTES ABSOLUTE COUNT: 1.2 10*9/L — ABNORMAL HIGH (ref 0.3–0.8)
MONOCYTES ABSOLUTE COUNT: 1.2 10*9/L — ABNORMAL HIGH (ref 0.3–0.8)
MONOCYTES ABSOLUTE COUNT: 1.6 10*9/L — ABNORMAL HIGH (ref 0.3–0.8)
MONOCYTES ABSOLUTE COUNT: 1.4 10*9/L — ABNORMAL HIGH (ref 0.3–0.8)
MONOCYTES ABSOLUTE COUNT: 2.3 10*9/L — ABNORMAL HIGH (ref 0.3–0.8)
MONOCYTES RELATIVE PERCENT: 18.4 %
MONOCYTES RELATIVE PERCENT: 11.2 %
MONOCYTES RELATIVE PERCENT: 9.3 %
MONOCYTES RELATIVE PERCENT: 11.6 %
MONOCYTES RELATIVE PERCENT: 11.7 %
MONOCYTES RELATIVE PERCENT: 16.2 %
NEUTROPHILS ABSOLUTE COUNT: 7.9 10*9/L — ABNORMAL HIGH (ref 1.8–7.8)
NEUTROPHILS ABSOLUTE COUNT: 9 10*9/L — ABNORMAL HIGH (ref 1.8–7.8)
NEUTROPHILS ABSOLUTE COUNT: 11.2 10*9/L — ABNORMAL HIGH (ref 1.8–7.8)
NEUTROPHILS ABSOLUTE COUNT: 11.5 10*9/L — ABNORMAL HIGH (ref 1.8–7.8)
NEUTROPHILS ABSOLUTE COUNT: 10.3 10*9/L — ABNORMAL HIGH (ref 1.8–7.8)
NEUTROPHILS ABSOLUTE COUNT: 11.1 10*9/L — ABNORMAL HIGH (ref 1.8–7.8)
NEUTROPHILS RELATIVE PERCENT: 72.8 %
NEUTROPHILS RELATIVE PERCENT: 81.3 %
NEUTROPHILS RELATIVE PERCENT: 86.1 %
NEUTROPHILS RELATIVE PERCENT: 83.9 %
NEUTROPHILS RELATIVE PERCENT: 83.8 %
NEUTROPHILS RELATIVE PERCENT: 76.9 %
PLATELET COUNT: 50 10*9/L — ABNORMAL LOW (ref 150–450)
PLATELET COUNT: 51 10*9/L — ABNORMAL LOW (ref 150–450)
PLATELET COUNT: 65 10*9/L — ABNORMAL LOW (ref 150–450)
PLATELET COUNT: 68 10*9/L — ABNORMAL LOW (ref 150–450)
PLATELET COUNT: 64 10*9/L — ABNORMAL LOW (ref 150–450)
PLATELET COUNT: 82 10*9/L — ABNORMAL LOW (ref 150–450)
RED BLOOD CELL COUNT: 3.44 10*12/L — ABNORMAL LOW (ref 3.95–5.13)
RED BLOOD CELL COUNT: 3.5 10*12/L — ABNORMAL LOW (ref 3.95–5.13)
RED BLOOD CELL COUNT: 3.47 10*12/L — ABNORMAL LOW (ref 3.95–5.13)
RED BLOOD CELL COUNT: 3.38 10*12/L — ABNORMAL LOW (ref 3.95–5.13)
RED BLOOD CELL COUNT: 3.15 10*12/L — ABNORMAL LOW (ref 3.95–5.13)
RED BLOOD CELL COUNT: 3.41 10*12/L — ABNORMAL LOW (ref 3.95–5.13)
RED CELL DISTRIBUTION WIDTH: 15.8 % — ABNORMAL HIGH (ref 12.2–15.2)
RED CELL DISTRIBUTION WIDTH: 15.9 % — ABNORMAL HIGH (ref 12.2–15.2)
RED CELL DISTRIBUTION WIDTH: 16.4 % — ABNORMAL HIGH (ref 12.2–15.2)
RED CELL DISTRIBUTION WIDTH: 16.2 % — ABNORMAL HIGH (ref 12.2–15.2)
RED CELL DISTRIBUTION WIDTH: 16.1 % — ABNORMAL HIGH (ref 12.2–15.2)
RED CELL DISTRIBUTION WIDTH: 16.5 % — ABNORMAL HIGH (ref 12.2–15.2)
WBC ADJUSTED: 10.8 10*9/L (ref 3.6–11.2)
WBC ADJUSTED: 11.1 10*9/L (ref 3.6–11.2)
WBC ADJUSTED: 13 10*9/L — ABNORMAL HIGH (ref 3.6–11.2)
WBC ADJUSTED: 13.7 10*9/L — ABNORMAL HIGH (ref 3.6–11.2)
WBC ADJUSTED: 12.3 10*9/L — ABNORMAL HIGH (ref 3.6–11.2)
WBC ADJUSTED: 14.4 10*9/L — ABNORMAL HIGH (ref 3.6–11.2)

## 2024-05-05 LAB — COMPREHENSIVE METABOLIC PANEL
ALBUMIN: 3.1 g/dL — ABNORMAL LOW (ref 3.4–5.0)
ALKALINE PHOSPHATASE: 85 U/L (ref 46–116)
ALT (SGPT): 196 U/L — ABNORMAL HIGH (ref 10–49)
ANION GAP: 17 mmol/L — ABNORMAL HIGH (ref 5–14)
AST (SGOT): 583 U/L — ABNORMAL HIGH (ref <=34)
BILIRUBIN TOTAL: 5.8 mg/dL — ABNORMAL HIGH (ref 0.3–1.2)
BLOOD UREA NITROGEN: 32 mg/dL — ABNORMAL HIGH (ref 9–23)
BUN / CREAT RATIO: 34
CALCIUM: 9.1 mg/dL (ref 8.7–10.4)
CHLORIDE: 108 mmol/L — ABNORMAL HIGH (ref 98–107)
CO2: 20 mmol/L (ref 20.0–31.0)
CREATININE: 0.95 mg/dL (ref 0.55–1.02)
EGFR CKD-EPI (2021) FEMALE: 69 mL/min/1.73m2 (ref >=60)
GLUCOSE RANDOM: 214 mg/dL — ABNORMAL HIGH (ref 70–179)
POTASSIUM: 4.8 mmol/L (ref 3.4–4.8)
PROTEIN TOTAL: 5.2 g/dL — ABNORMAL LOW (ref 5.7–8.2)
SODIUM: 145 mmol/L (ref 135–145)

## 2024-05-05 LAB — HEPATIC FUNCTION PANEL
ALBUMIN: 2.9 g/dL — ABNORMAL LOW (ref 3.4–5.0)
ALBUMIN: 3.2 g/dL — ABNORMAL LOW (ref 3.4–5.0)
ALKALINE PHOSPHATASE: 87 U/L (ref 46–116)
ALKALINE PHOSPHATASE: 88 U/L (ref 46–116)
ALT (SGPT): 217 U/L — ABNORMAL HIGH (ref 10–49)
ALT (SGPT): 224 U/L — ABNORMAL HIGH (ref 10–49)
AST (SGOT): 615 U/L — ABNORMAL HIGH (ref <=34)
AST (SGOT): 564 U/L — ABNORMAL HIGH (ref <=34)
BILIRUBIN DIRECT: 4.5 mg/dL — ABNORMAL HIGH (ref 0.00–0.30)
BILIRUBIN DIRECT: 5.9 mg/dL — ABNORMAL HIGH (ref 0.00–0.30)
BILIRUBIN TOTAL: 6.6 mg/dL — ABNORMAL HIGH (ref 0.3–1.2)
BILIRUBIN TOTAL: 8.8 mg/dL — ABNORMAL HIGH (ref 0.3–1.2)
PROTEIN TOTAL: 5 g/dL — ABNORMAL LOW (ref 5.7–8.2)
PROTEIN TOTAL: 5.4 g/dL — ABNORMAL LOW (ref 5.7–8.2)

## 2024-05-05 LAB — BLOOD GAS CRITICAL CARE PANEL, ARTERIAL
BASE EXCESS ARTERIAL: -4.9 — ABNORMAL LOW (ref -2.0–2.0)
BASE EXCESS ARTERIAL: -5.1 — ABNORMAL LOW (ref -2.0–2.0)
BASE EXCESS ARTERIAL: -5.2 — ABNORMAL LOW (ref -2.0–2.0)
BASE EXCESS ARTERIAL: -4.8 — ABNORMAL LOW (ref -2.0–2.0)
CALCIUM IONIZED ARTERIAL (MG/DL): 4.91 mg/dL (ref 4.40–5.40)
CALCIUM IONIZED ARTERIAL (MG/DL): 4.92 mg/dL (ref 4.40–5.40)
CALCIUM IONIZED ARTERIAL (MG/DL): 4.85 mg/dL (ref 4.40–5.40)
CALCIUM IONIZED ARTERIAL (MG/DL): 5.21 mg/dL (ref 4.40–5.40)
FIO2 ARTERIAL: 50
FIO2 ARTERIAL: 100
GLUCOSE WHOLE BLOOD: 194 mg/dL — ABNORMAL HIGH (ref 70–179)
GLUCOSE WHOLE BLOOD: 210 mg/dL — ABNORMAL HIGH (ref 70–179)
GLUCOSE WHOLE BLOOD: 266 mg/dL — ABNORMAL HIGH (ref 70–179)
GLUCOSE WHOLE BLOOD: 291 mg/dL — ABNORMAL HIGH (ref 70–179)
HCO3 ARTERIAL: 20 mmol/L — ABNORMAL LOW (ref 22–27)
HCO3 ARTERIAL: 19 mmol/L — ABNORMAL LOW (ref 22–27)
HCO3 ARTERIAL: 18 mmol/L — ABNORMAL LOW (ref 22–27)
HCO3 ARTERIAL: 20 mmol/L — ABNORMAL LOW (ref 22–27)
HEMOGLOBIN BLOOD GAS: 10.1 g/dL — ABNORMAL LOW (ref 12.00–16.00)
HEMOGLOBIN BLOOD GAS: 10.4 g/dL — ABNORMAL LOW (ref 12.00–16.00)
HEMOGLOBIN BLOOD GAS: 9 g/dL — ABNORMAL LOW (ref 12.00–16.00)
HEMOGLOBIN BLOOD GAS: 10.3 g/dL — ABNORMAL LOW (ref 12.00–16.00)
LACTATE BLOOD ARTERIAL: 6.4 mmol/L (ref <1.3)
LACTATE BLOOD ARTERIAL: 5.4 mmol/L (ref <1.3)
LACTATE BLOOD ARTERIAL: 5.1 mmol/L (ref <1.3)
LACTATE BLOOD ARTERIAL: 4.1 mmol/L (ref <1.3)
O2 SATURATION ARTERIAL: 98.7 % (ref 94.0–100.0)
O2 SATURATION ARTERIAL: 97.6 % (ref 94.0–100.0)
O2 SATURATION ARTERIAL: 89.2 % — ABNORMAL LOW (ref 94.0–100.0)
O2 SATURATION ARTERIAL: 98.9 % (ref 94.0–100.0)
PCO2 ARTERIAL: 35.4 mmHg (ref 35.0–45.0)
PCO2 ARTERIAL: 31.7 mmHg — ABNORMAL LOW (ref 35.0–45.0)
PCO2 ARTERIAL: 26.8 mmHg — ABNORMAL LOW (ref 35.0–45.0)
PCO2 ARTERIAL: 38.8 mmHg (ref 35.0–45.0)
PH ARTERIAL: 7.36 (ref 7.35–7.45)
PH ARTERIAL: 7.39 (ref 7.35–7.45)
PH ARTERIAL: 7.45 (ref 7.35–7.45)
PH ARTERIAL: 7.33 — ABNORMAL LOW (ref 7.35–7.45)
PO2 ARTERIAL: 104 mmHg (ref 80.0–110.0)
PO2 ARTERIAL: 87.4 mmHg (ref 80.0–110.0)
PO2 ARTERIAL: 49.5 mmHg — ABNORMAL LOW (ref 80.0–110.0)
PO2 ARTERIAL: 147 mmHg — ABNORMAL HIGH (ref 80.0–110.0)
POTASSIUM WHOLE BLOOD: 4.3 mmol/L (ref 3.4–4.6)
POTASSIUM WHOLE BLOOD: 4.6 mmol/L (ref 3.4–4.6)
POTASSIUM WHOLE BLOOD: 5 mmol/L — ABNORMAL HIGH (ref 3.4–4.6)
POTASSIUM WHOLE BLOOD: 5.4 mmol/L — ABNORMAL HIGH (ref 3.4–4.6)
SODIUM WHOLE BLOOD: 143 mmol/L (ref 135–145)
SODIUM WHOLE BLOOD: 142 mmol/L (ref 135–145)
SODIUM WHOLE BLOOD: 142 mmol/L (ref 135–145)
SODIUM WHOLE BLOOD: 143 mmol/L (ref 135–145)

## 2024-05-05 LAB — BASIC METABOLIC PANEL
ANION GAP: 13 mmol/L (ref 5–14)
BLOOD UREA NITROGEN: 48 mg/dL — ABNORMAL HIGH (ref 9–23)
BUN / CREAT RATIO: 47
CALCIUM: 9 mg/dL (ref 8.7–10.4)
CHLORIDE: 109 mmol/L — ABNORMAL HIGH (ref 98–107)
CO2: 20 mmol/L (ref 20.0–31.0)
CREATININE: 1.03 mg/dL — ABNORMAL HIGH (ref 0.55–1.02)
EGFR CKD-EPI (2021) FEMALE: 63 mL/min/1.73m2 (ref >=60)
GLUCOSE RANDOM: 287 mg/dL — ABNORMAL HIGH (ref 70–179)
POTASSIUM: 5 mmol/L — ABNORMAL HIGH (ref 3.4–4.8)
SODIUM: 142 mmol/L (ref 135–145)

## 2024-05-05 LAB — SLIDE REVIEW

## 2024-05-05 LAB — PROTIME-INR
INR: 1.54
INR: 2.01
PROTIME: 17.5 s — ABNORMAL HIGH (ref 9.9–12.6)
PROTIME: 22.9 s — ABNORMAL HIGH (ref 9.9–12.6)

## 2024-05-05 LAB — FIBRINOGEN
FIBRINOGEN LEVEL: 187 mg/dL (ref 175–500)
FIBRINOGEN LEVEL: 171 mg/dL — ABNORMAL LOW (ref 175–500)
FIBRINOGEN LEVEL: 176 mg/dL (ref 175–500)
FIBRINOGEN LEVEL: 165 mg/dL — ABNORMAL LOW (ref 175–500)

## 2024-05-05 LAB — VANCOMYCIN, RANDOM: VANCOMYCIN RANDOM: 9.8 ug/mL

## 2024-05-05 LAB — MAGNESIUM: MAGNESIUM: 2.3 mg/dL (ref 1.6–2.6)

## 2024-05-05 MED ADMIN — NORepinephrine 8 mg in dextrose 5 % 250 mL (32 mcg/mL) infusion PMB: 0-40 ug/min | INTRAVENOUS | @ 07:00:00

## 2024-05-05 MED ADMIN — iohexol (OMNIPAQUE) 300 mg iodine/mL solution 155 mL: 155 mL | INTRAVENOUS | @ 03:00:00 | Stop: 2024-05-04

## 2024-05-05 MED ADMIN — calcium chloride 100 mg/mL (10 %) syringe: INTRAVENOUS | @ 03:00:00

## 2024-05-05 MED ADMIN — ROCuronium (ZEMURON) injection: INTRAVENOUS | @ 01:00:00

## 2024-05-05 MED ADMIN — NORepinephrine 8 mg in dextrose 5 % 250 mL (32 mcg/mL) infusion PMB: 0-40 ug/min | INTRAVENOUS | @ 12:00:00

## 2024-05-05 MED ADMIN — insulin NPH (HumuLIN,NovoLIN) injection 8 Units: 8 [IU] | SUBCUTANEOUS | @ 12:00:00 | Stop: 2024-05-05

## 2024-05-05 MED ADMIN — ROCuronium (ZEMURON) injection: INTRAVENOUS | @ 22:00:00

## 2024-05-05 MED ADMIN — vasopressin 20 units in 100 mL (0.2 units/mL) infusion premade vial: .03 [IU]/min | INTRAVENOUS | @ 23:00:00

## 2024-05-05 MED ADMIN — piperacillin-tazobactam (ZOSYN) IVPB (premix) 4.5 g: 4.5 g | INTRAVENOUS | @ 19:00:00 | Stop: 2024-05-11

## 2024-05-05 MED ADMIN — fentaNYL (PF) (SUBLIMAZE) injection 50 mcg: 50 ug | INTRAVENOUS | @ 21:00:00 | Stop: 2024-05-05

## 2024-05-05 MED ADMIN — midazolam (VERSED) injection 2 mg: 2 mg | INTRAVENOUS | @ 21:00:00 | Stop: 2024-05-05

## 2024-05-05 MED ADMIN — pantoprazole (Protonix) injection 40 mg: 40 mg | INTRAVENOUS | @ 05:00:00

## 2024-05-05 MED ADMIN — hydrocortisone sod succ (Solu-CORTEF) injection 50 mg: 50 mg | INTRAVENOUS | @ 05:00:00 | Stop: 2024-05-05

## 2024-05-05 MED ADMIN — ampicillin-sulbactam (UNASYN) injection: INTRAVENOUS

## 2024-05-05 MED ADMIN — micafungin (MYCAMINE) 100 mg in sodium chloride 0.9 % (NS) 100 mL IVPB-MBP: 100 mg | INTRAVENOUS | @ 08:00:00 | Stop: 2024-05-05

## 2024-05-05 MED ADMIN — phenylephrine 1 mg/10 mL (100 mcg/mL) injection Syrg: INTRAVENOUS | @ 22:00:00

## 2024-05-05 MED ADMIN — propofol (DIPRIVAN) infusion 10 mg/mL: 0-50 ug/kg/min | INTRAVENOUS | @ 12:00:00

## 2024-05-05 MED ADMIN — fentaNYL (PF) (SUBLIMAZE) injection: INTRAVENOUS | @ 01:00:00

## 2024-05-05 MED ADMIN — calcium chloride 100 mg/mL (10 %) syringe: INTRAVENOUS

## 2024-05-05 MED ADMIN — Propofol (DIPRIVAN) injection: INTRAVENOUS | @ 22:00:00

## 2024-05-05 MED ADMIN — insulin regular (HumuLIN,NovoLIN) 100 Units in sodium chloride (NS) 0.9 % 100 mL infusion: INTRAVENOUS

## 2024-05-05 MED ADMIN — levETIRAcetam (KEPPRA) injection 750 mg: 750 mg | INTRAVENOUS | @ 05:00:00

## 2024-05-05 MED ADMIN — hydrocortisone sod succ (Solu-CORTEF) injection 50 mg: 50 mg | INTRAVENOUS | @ 10:00:00 | Stop: 2024-05-05

## 2024-05-05 MED ADMIN — insulin regular (HumuLIN,NovoLIN) injection: SUBCUTANEOUS | @ 03:00:00

## 2024-05-05 MED ADMIN — insulin NPH (HumuLIN,NovoLIN) injection 15 Units: 15 [IU] | SUBCUTANEOUS | @ 10:00:00

## 2024-05-05 MED ADMIN — octreotide 500 mcg in sodium chloride 0.9 % 100 mL (5 mcg/mL) infusion: 50 ug/h | INTRAVENOUS | @ 14:00:00

## 2024-05-05 MED ADMIN — Propofol (DIPRIVAN) injection: INTRAVENOUS | @ 02:00:00

## 2024-05-05 MED ADMIN — insulin regular (HumuLIN,NovoLIN) injection: SUBCUTANEOUS | @ 02:00:00

## 2024-05-05 MED ADMIN — insulin lispro (HumaLOG) injection 0-20 Units: 0-20 [IU] | SUBCUTANEOUS | @ 23:00:00

## 2024-05-05 MED ADMIN — albumin human 5 %: INTRAVENOUS | @ 02:00:00

## 2024-05-05 MED ADMIN — albumin human 25 % 50 g: 50 g | INTRAVENOUS | @ 16:00:00 | Stop: 2024-05-05

## 2024-05-05 MED ADMIN — fentaNYL (PF) (SUBLIMAZE) injection: INTRAVENOUS | @ 02:00:00

## 2024-05-05 MED ADMIN — sugammadex (BRIDION) injection: INTRAVENOUS | @ 03:00:00

## 2024-05-05 MED ADMIN — insulin lispro (HumaLOG) injection 0-20 Units: 0-20 [IU] | SUBCUTANEOUS | @ 16:00:00

## 2024-05-05 MED ADMIN — pantoprazole (Protonix) injection 40 mg: 40 mg | INTRAVENOUS | @ 12:00:00

## 2024-05-05 MED ADMIN — piperacillin-tazobactam (ZOSYN) IVPB (premix) 4.5 g: 4.5 g | INTRAVENOUS | @ 10:00:00 | Stop: 2024-05-11

## 2024-05-05 MED ADMIN — fentaNYL (PF) (SUBLIMAZE) injection: INTRAVENOUS | @ 03:00:00

## 2024-05-05 MED ADMIN — propofol (DIPRIVAN) infusion 10 mg/mL: 0-50 ug/kg/min | INTRAVENOUS | @ 09:00:00

## 2024-05-05 MED ADMIN — vasopressin 20 units in 100 mL (0.2 units/mL) infusion premade vial: .03 [IU]/min | INTRAVENOUS | @ 12:00:00

## 2024-05-05 MED ADMIN — piperacillin-tazobactam (ZOSYN) IVPB (premix) 4.5 g: 4.5 g | INTRAVENOUS | @ 05:00:00 | Stop: 2024-05-11

## 2024-05-05 MED ADMIN — octreotide 500 mcg in sodium chloride 0.9 % 100 mL (5 mcg/mL) infusion: 50 ug/h | INTRAVENOUS | @ 12:00:00

## 2024-05-05 MED ADMIN — levETIRAcetam (KEPPRA) injection 750 mg: 750 mg | INTRAVENOUS | @ 12:00:00

## 2024-05-05 MED ADMIN — ampicillin-sulbactam (UNASYN) injection: INTRAVENOUS | @ 02:00:00

## 2024-05-05 MED ADMIN — fentaNYL (PF) (SUBLIMAZE) injection 50 mcg: 50 ug | INTRAVENOUS | @ 20:00:00 | Stop: 2024-05-18

## 2024-05-05 MED ADMIN — ROCuronium (ZEMURON) injection: INTRAVENOUS | @ 02:00:00

## 2024-05-05 MED ADMIN — insulin regular (HumuLIN,NovoLIN) injection: SUBCUTANEOUS

## 2024-05-05 MED ADMIN — insulin NPH (HumuLIN,NovoLIN) injection 15 Units: 15 [IU] | SUBCUTANEOUS | @ 23:00:00

## 2024-05-05 MED ADMIN — calcium chloride 100 mg/mL (10 %) syringe: INTRAVENOUS | @ 02:00:00

## 2024-05-05 MED FILL — INSULIN LISPRO (U-100) 100 UNIT/ML SUBCUTANEOUS PEN: SUBCUTANEOUS | 63 days supply | Qty: 15 | Fill #0

## 2024-05-05 MED FILL — LANTUS SOLOSTAR U-100 INSULIN 100 UNIT/ML (3 ML) SUBCUTANEOUS PEN: SUBCUTANEOUS | 83 days supply | Qty: 30 | Fill #0

## 2024-05-05 NOTE — Unmapped (Signed)
 Pt arrived from VIR and placed back on the vent.7.0  ETT patent and secure at 24cm at the lips. Settings adjusted per patient needs, MD aware. Back Home Huddle completed. ABG pending. Will continue to monitor and wean as tolerated.

## 2024-05-05 NOTE — Unmapped (Signed)
 DIVISION OF CARDIOLOGY  [facility]        Date of Service: 05/05/2024    CARDIOLOGY Treatment Plan Note    Requesting Physician: Alean Hunt, MD   Requesting Service: Medical ICU (MDI)   Consulting Fellow: Dalbert Dubois, MD       Assessment & Recommendations     [name] is a 58 y.o. female with PMH CAD s/p NSTEMI (reportedly no stents), CKD, decompensated cirrhosisMASLD c/b  ascites, hepatic encephalopathy and EV s/p recent banding admitted with UGIB 2/bleeding esophageal ulcers2 s/p unsuccessful TIPS complicated by pulsatile VT s/p shocks requiring transfer to [facility] ICU.  In hemorrhagic shock secondary to gastric bleed on admission requiring MTP now s/p TIPS. Cardiology consulted for arrhythmias and rising troponin.    Over the course of 6/5 she was extubated and then reintubated shortly thereafter.  Over the course the day with increasing oxygen requirements up to FiO2 90% in the early afternoon.  CXR with increasing pulmonary edema~18:30.     From a cardiac perspective, she came in with a troponin elevation of 5500 that peaked around 6500 thought secondary to demand ischemia in the setting of hemorrhagic shock and recent VT arrest requiring defibrillation at OSH.  In the evening of 6/5, noted to be going in and out of what appeared to be wide-complex arrhythmias.  Bedside POCUS by MICU fellow with concern for worsening cardiac function.  She had echo completed on 6/4 with normal LVEF > 55% and mild MR.  Cardiology called given abnormal heart rhythms and worsening EF on POCUS.  Personal bedside POCUS with interval slight decrease in EF with more basilar contractility and what appears to be apical ballooning that could be consistent with stress cardiomyopathy in the setting of recent shock.  However, cannot exclude LAD disease given she has known CAD of unknown vessels (previous LHC not available in chart).  EKGs at bedside demonstrate wide-complex tachycardia in the 130s with axis flip in 2, 3 and aVF.  However no concordance or NW axis and given rates in the 130s with stable BP during prolonged runs of 20-30 seconds, suspect more likely to be A-fib with aberrancy than true VT.  Did have some ST depressions in V4-V5 approximately 1-2 mm but no ST elevation. Repeat troponin now trending back up to 7200. Patient was stabilized with amiodarone  without further arrhythmias noted on telemetry.    Recommendations  - Suspect more likely stress cardiomyopathy  - No acute ischemic changes on EKG indicating need for urgent LHC evaluation  - Recommend formal echo in AM  - Discussed with team that we could consider heparinization in the setting of NSTEMI, however given recent MTP, bleeding esophageal varices and ulcers, relative thrombocytopenia feel the risks of anticoagulation outweigh any potential benefits at this time to which the team agreed  - Given e/o volume overload and worsening EF, ok with trial of lasix    -- Continue amio gtt  -- Telemetry, maintain K >4, Mg > 2  -- Trend troponin q4-6h with EKG to look for dynamic changes   -- Formal consult to follow in AM    Dalbert Dubois, MD PhD   Cardiovascular Medicine Fellow PGY-4

## 2024-05-05 NOTE — Unmapped (Signed)
 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 The patient is seen in consultation at the request of Subhashini Brion Cancel, MD (Medical ICU (MDI)) for decompensated cirrhosis with variceal bleeding.    Patient with significant UGIB likely 2/2 gastric varix that was unable to be controlled endoscopically. Patient underwent successful salvage TIPS overnight. She has stopped bleeding,  coming off pressors, and will be extubated. She will continue to have blood in her stool given the amount of blood she lost into her GI tract. Would recommend aggressive lactulose  bowel regimen starting now to prevent ammonia load of blood product breakdown. Since her portal HTN has been treated, octreotide  is no longer needed. She should continue on abx. She is at risk for decompensation in the coming days due to ischemic injury to her liver plus TIPS.    Recommendations:  Dx  - Daily MELD labs  - Continue to trend Hgb and monitor for GIB  - Follow up infectious studies    Tx  - Cont broad spectrum abx for at least 3-5d, agree with antifungal coverage  - Stop octreotide   - Continue lactulose  for 3-5 bowel movements daily and rifaximin    - Hold diuretics iso hypotension    Misc  - May need to consider transplant evaluation in the coming days if liver function worsens (ischemic hit + TIPS)      MELD 3.0: 21 at 05/05/2024  8:12 AM  MELD-Na: 18 at 05/05/2024  8:12 AM  Calculated from:  Serum Creatinine: 0.95 mg/dL (Using min of 1 mg/dL) at 12/06/1094  0:45 AM  Serum Sodium: 145 mmol/L (Using max of 137 mmol/L) at 05/05/2024  3:07 AM  Total Bilirubin: 6.6 mg/dL at 4/0/9811  9:14 AM  Serum Albumin : 2.9 g/dL at 06/08/2955  2:13 AM  INR(ratio): 1.54 at 05/05/2024  3:07 AM  Age at listing (hypothetical): 57 years  Sex: Female at 05/05/2024  8:12 AM      Issues Impacting Complexity of Management:  -The patient has the need for intensive monitoring parameter(s) due to high-risk of clinical decline: q4h or more frequent monitoring of hemoglobins to monitor for stability of GI bleeding and frequent monitoring of MELD score to monitor for progression to/progression of acute or acute on chronic liver failure    Recommendations discussed with the patient's primary team. We will continue to follow along with you.    Subjective:   - Successful TIPS overnight. Reduction of portosystemic gradient from 14 mmHg to 9 mmHg. Paracentesis with removal of 4.7 L ascites.   - Hgb stable this AM. Pressor requirement improving. Bedside RN reports only 1 BM overnight.  - MICU team planning on extubating today    Objective:   Temp:  [36.8 ??C (98.2 ??F)-36.9 ??C (98.4 ??F)] 36.8 ??C (98.2 ??F)  Pulse:  [101-141] 120  SpO2 Pulse:  [101-143] 119  Resp:  [10-31] 17  FiO2 (%):  [40 %-100 %] 40 %  SpO2:  [96 %-100 %] 99 %    Gen: Ill appearing  HEENT: intubated   Abdomen:  soft, moderately distended     Pertinent Labs & Studies:  -I have reviewed the patient's labs from 05/05/24 which show stable Hgb, worsening LFTs, and stable INR

## 2024-05-05 NOTE — Unmapped (Signed)
 MICU Daily Progress Note     Date of Service: 05/05/2024    Problem List:   Principal Problem:    Shock    Active Problems:    Type 2 diabetes mellitus, with long-term current use of insulin       Hyperlipidemia    Hypertension    Coronary artery disease involving native heart without angina pectoris    Diabetes mellitus      Esophageal varices in cirrhosis      Hepatic encephalopathy      Calcified cerebral meningioma      Portal hypertension      Other chronic pain    OSA (obstructive sleep apnea)    Stage 3a chronic kidney disease    Hematemesis      Interval history: Ashley Briggs is a 58 y.o. female with NASH cirrhosis (c/b ascites, hepatic encephalopathy, esophageal varices), CAD with prior NSTEMI, CKD who presented to OSH with hematemesis 6 days s/p variceal banding. Emergent TIPS attempted at OSH but not successful. She was transferred to [facility] MICU with 2 pressor shock for accelerated transplant evaluation. EGD 6/4 demonstrated nonbleeding ulcers at prior banding sites, but significant bleeding from stomach that could not be cleared endoscopically.     24hr events:   - Rapidly escalating pressor requirement 6/4 requiring 4 pressors, stress dose steroids, and massive transfusion protocol  - Numerous attempts to stabilize with Darius Edouard unsuccessful  - S/p emergent TIPS and paracentesis with VIR 6/4  - Norepi down to , remains on vaso    Neurological   Sedation  Intubated for procedure and altered mental status. Sedated with propofol  gtt.   - Wean propofol  as tolerated  - Triglycerides q96h while on propofol  gtt  - Daily SAT    History of seizure-like activity (stable)  - Continue IV equivalent of home Keppra  750mg  BID    Chronic pain  History of chronic pain, treated with baclofen , naltrexone , oxycodone .   - Hold home baclofen , naltrextone, oxycodone  in the setting of hypotension and sedation    Major depressive disorder (stable)  - Hold home bupoprion, citalopram     Insomnia (stable)  - Hold home zolpidem     PMH calcified cerebral meningioma (stable)  - no intervention at this time    Analgesia: pain adequately controlled  RASS at goal? Yes  [place] Agitation Assessment Scale (RASS): -1 (6/5 8:00am)    Pulmonary   Intubation and mechanical ventilation  Patient intubated for mental status and mechanically ventilated. SBT passed 6/5.   - Pending SAB will plan for extubation    FiO2 (%):  [80 %-100 %] 80 %  PR SUP:  [5 cm H20] 5 cm H20  O2 Device: Ventilator    Left pleural effusion (stable)  CXR 6/4 remarkable for moderate Left pleural effusion with passive atelectasis. Appears simple on bedside POCUS. Likely 2/2 third spacing ISO decompensated cirrhosis.   - CTM    Cardiovascular   Undifferentiated shock, septic versus hemorrhagic (improving)  Arrived to [facility] MICU in undifferentiated shock requiring 2 pressors. Acutely worsening hypotension on 6/4 requiring fourth pressor, aggressive fluid resuscitation, and massive transfusion protocol. S/p emergent TIPS, now with decreasing pressor requirement. Etiology more likely hypovolemic in the setting of refractory portal hypertensive bleeding, though underlying infection cannot be excluded.  - Bcx NGTD x 24 hrs  - Ucx pending  - Continue norepi, vaso  - Stop stress dose steroids  - Narrow to single-agent zosyn    Type II NSTEMI - Hypertroponinemia  Troponin elevated to 5.4k on admission, peaked at 6.4k, most likely 2/2 demand ischemia in the setting of shock.   - Management of shock as above    Ventricular tachycardia (resolved)  Developed monomorphic VT during TIPS procedure at outside hospital requiring shock, though she did not lose pulse. Possibly 2/2 ischemic scar ISO CAD history. Following troponin as pressors may be masking cardiac function. Trop peaked at 5.4k, suggestive of cardiac injury 2/2 VT. TTE without wall motion abnormalities.   - Daily CMP  - Replete K>4, Mg >2    CAD, prior NSTEMI  Prior NSTEMI per chart review. No LHC data available. Unclear if she has history of coronary stent.   - Home aspirin  held ISO bleeding    Renal   AKI (improving)  Cr on admission 1.29, elevated from baseline ~0.8. Likely prerenal 2/2 poor renal perfusion ISO shock.   - Daily CMP  - Management of shock as above    Infectious Disease/Autoimmune   Undifferentiated shock, c/f septic shock (improving) - Leukocytosis (resolved)  Presented to [facility] MICU in 2-pressor shock with leukocytosis to 24.9. UA noninfectious, CXR without pneumonia. Bcx NGTD x 24 hours. Most likely infectious source intra-abdominal. SBP considered, unfortunately ascites from paracentesis on 6/4 were not sent for diagnostic studies. No skin findings on exam to suggest SSTI. Femoral line placed at OSH possibly an infectious source, though Bcx have been NGTD. S/p vanc/zosyn. S/p tobramycin x 1.  - Ucx pending  - Stop vancomycin, narrow to single-agent zosyn  - Remove R femoral line from OSH    Cultures:  No results found for: BLOOD CULTURE, BLOOD CULTURE, ROUTINE, URINE CULTURE, COMPREHENSIVE, URINE CULTURE, COMPREHENSIVE, LOWER RESPIRATORY CULTURE  WBC (10*9/L)   Date Value   05/05/2024 11.1     WBC, UA (/HPF)   Date Value   05/04/2024 1            FEN/GI   Refractory portal hypertensive bleeding - Decompensated cirrhosis - Ascites - Hx hepatic encephalopathy  Hx of NASH cirrhosis presenting to OSH with hematemsis 6 days after esophageal variceal banding. EDG at OSH demonstrated bleeding esophageal ulcers. Emergent TIPS at OSH not successful due to inability to access the portal vein, raising concern for portal vein thrombus. EGD on 6/4 demonstrated nonbleeding esophageal ulcers at side of prior banding, stomach completely occluded by blood which could not be cleared endoscopically. With acute decline in Hb and hypotension on 6/4. Darius Edouard attempted with GI, but unsuccessful after numerous attempts. S/p emergent TIPS on 6/4. Paracentesis during TIPS drained 4.7L, unfortunately not sent for any diagnostic studies.   - S/p emergent TIPS  - Hepatology c/s, appreciate recs  - Maintain fibrinogen > 100 with cryo  - Consider diagnostic paracentesis pending clinical course  - Continue octreotide 50mcg/hr to complete 24-48 hours post-TIPS  - Daily MELLD labs (CMP, PT/INR)  - Home lasix , spironolactone  held d/t hypotension  - Home rifaxamin, lactulose  held d/t no enteral access  - Albumin 25% 50g    Malnutrition Assessment:   Body mass index is 33.59 kg/m??.BMI Interpretation: >/= 30 and < 40, consistent with obesity, clinically significant requiring additional resources and complicating multiple aspects of patient care.  GLIM criteria:              Pt does not meet criteria  -I have screened this patient for malnutrition and they did NOT meet criteria for malnutrition based on GLIM criteria.  -Nutrition consulted no       Heme/Coag  Blood loss anemia, likely 2/2 refractory portal hypertensive bleeding  S/p 3 units pRBCs at OSH, Hb 9.3 on arrival to MICU. S/p massive transfusion protocol on 6/4. Anticipate Hb stabilization now s/p TIPS.   - q4h CBC with diff  - Management of GI bleed as above    Thrombocytopenia  Plts 253k on admission, acute decline to 50k on 6/5. Currently without signs of active bleeding.   - q4h CBC with diff    Endocrine   Insulin -dependent T2DM  Last A1c 7.4 in 11/2023. Home regimen lantus  60, lispro with meals. BG above goal, anticipate improvement with discontinuing stress dose corticosteroids.   - NPH 15 units BID  - SSI  - q6h BG checks    Integumentary   Psoriasis  History of psoriasis per chart review. With numerous chronic appearing scattered erythematous plaques with silvery scale. Of note, on Secukinumab  for psoriasis management, which can increase risk for infection.    # Sacral wound, 01/2024, unclear if still present  - WOCN consulted for high risk skin assessment Yes.  - cont pressure mitigating precautions per skin policy    Prophylaxis/LDA/Restraints/Consults   ICU checklist completed: yes (see ICU rounding navigator in Epic)    Patient Lines/Drains/Airways Status       Active Active Lines, Drains, & Airways       Name Placement date Placement time Site Days    ETT  7 --  --  -- --    Introducer 05/04/24 Internal jugular Right 05/04/24  0500  Internal jugular  1    Introducer 05/04/24 Femoral Left 05/04/24  1314  Femoral  less than 1    CVC Triple Lumen 05/04/24 Left Femoral 05/04/24  0500  Femoral  1    Urethral Catheter 05/04/24  0500  --  1    Peripheral IV 05/04/24 Anterior;Left;Upper Arm 05/04/24  0500  Arm  1    Peripheral IV 05/04/24 Anterior;Left Forearm 05/04/24  0500  Forearm  1    Peripheral IV 05/04/24 Anterior;Right;Upper Arm 05/04/24  0500  Arm  1    Arterial Line 05/04/24 Right Femoral 05/04/24  0800  Femoral  less than 1                  Patient Lines/Drains/Airways Status       Active Wounds       Name Placement date Placement time Site Days    Surgical Site 05/04/24 Abdomen Lower;Right 05/04/24  2300  -- less than 1    Surgical Site 05/04/24 Neck Right 05/04/24  2300  -- less than 1    Surgical Site 05/04/24 Groin Right 05/04/24  2300  -- less than 1                  Mechanical VTE ppx     WOCN consulted for sacral wounds    Bowel regimen deferred in the absence of enteral access - pending NG tube placement for enteral access  Goals of Care     Code Status: Full Code    Designated Healthcare Decision Maker:  Ms. Windle current decisional capacity for healthcare decision-making is Full capacity. Her designated healthcare decision maker(s) is/are   HCDM (patient stated preference): Caudle,Ryan - Relative - [phone].      Subjective     Emergent TIPS overnight with VIR as well as paracentesis, drained 5L ascites. With large (~500cc) BRBPR after TIPS, but no melena/BRBPR since.    Objective     Vitals - past  24 hours  Temp:  [36 ??C (96.8 ??F)-36.9 ??C (98.4 ??F)] 36.8 ??C (98.2 ??F)  Pulse:  [101-141] 117  SpO2 Pulse:  [101-143] 117  Resp:  [10-31] 18  FiO2 (%):  [40 %-100 %] 40 %  SpO2:  [96 %-100 %] 97 % Intake/Output  I/O last 3 completed shifts:  In: 16109 [I.V.:2404.2; Blood:1280; IV Piggyback:6324.8]  Out: 8125 [Urine:3275; Drains:4700; Blood:150]     Physical Exam:    General: Ill-appearing female, lying in bed. Intubated and sedated.   HEENT: Mucous membranes dry.  CV: Tachycardic. Regular rhythm.   Pulm: CTAB on anterior exam.  GI: Abd soft, nontender.  Extremities: Pulses weak but palpable. Trace peripheral edema.   Skin: Diffuse scattered erythematous plaques with silvery scale. No bruising, petechiae, mucocutaneous bleeding.  Neuro: Intubated and sedated.    Continuous Infusions:    EPINEPHrine Stopped (05/04/24 1808)    NORepinephrine bitartrate-NS 12 mcg/min (05/05/24 0303)    octreotide infusion 50 mcg/hr (05/04/24 1924)    phenylephrine  HCl in 0.9% NaCl Stopped (05/04/24 2042)    propofol  10 mg/mL infusion 30 mcg/kg/min (05/05/24 0441)    vasopressin 0.03 Units/min (05/04/24 1924)       Scheduled Medications:    hydrocortisone sod succ  50 mg Intravenous Q6H SCH    HYDROmorphone  1 mg Intravenous Once    insulin  lispro  0-20 Units Subcutaneous ACHS    insulin  NPH  15 Units Subcutaneous Q12H SCH    insulin  NPH  8 Units Subcutaneous Once    levETIRAcetam   750 mg Intravenous Q12H SCH    micafungin  100 mg Intravenous Q24H    midazolam  4 mg Intravenous Once    pantoprazole  (Protonix ) intravenous solution  40 mg Intravenous BID    piperacillin-tazobactam  4.5 g Intravenous Q8H SCH    Vancomycin - Pharmacy dosing by levels   Other Pharmacy dosing       PRN medications:  dextrose  in water , fentaNYL  (PF) **OR** fentaNYL  (PF), glucagon, glucose, midazolam    Data/Imaging Review: Reviewed in Epic and personally interpreted on 05/05/2024. See EMR for detailed results.       Vallery Gavel, MS4  Macarthur Savory,  MD

## 2024-05-05 NOTE — Unmapped (Signed)
 Pt on the vent through the night; Airway secure and intact. Settings weaned and SBT complete. Pt remains on PSV 8/5, tolerating well. Oral care and suctioning provided as needed. Emergency airway supplies at [facility]. Will continue to monitor.     Problem: Mechanical Ventilation Invasive  Goal: Effective Communication  Outcome: Ongoing - Unchanged  Goal: Optimal Device Function  Outcome: Ongoing - Unchanged  Intervention: Optimize Device Care and Function  Recent Flowsheet Documentation  Taken 05/05/2024 0245 by Melvia Stacks, RRT  Airway/Ventilation Management:   airway patency maintained   humidification applied  Taken 05/04/2024 2340 by Melvia Stacks, RRT  Airway/Ventilation Management:   airway patency maintained   humidification applied  Goal: Mechanical Ventilation Liberation  Outcome: Ongoing - Unchanged  Goal: Absence of Device-Related Skin and Tissue Injury  Outcome: Ongoing - Unchanged  Goal: Absence of Ventilator-Induced Lung Injury  Outcome: Ongoing - Unchanged  Intervention: Prevent Ventilator-Associated Pneumonia  Recent Flowsheet Documentation  Taken 05/05/2024 0601 by Melvia Stacks, RRT  Head of Bed [facility]) Positioning: HOB at 30-45 degrees  VAP Prevention Bundle:   readiness to extubate assessed   sedation interruption performed   spontaneous breathing trial performed  Taken 05/05/2024 0245 by Melvia Stacks, RRT  Head of Bed [facility]) Positioning: HOB at 30-45 degrees  VAP Prevention Bundle:   HOB elevation maintained   oral care regularly provided   vent circuit breaks minimized  Taken 05/04/2024 2340 by Melvia Stacks, RRT  VAP Prevention Bundle:   HOB elevation maintained   oral care regularly provided   vent circuit breaks minimized     Problem: Artificial Airway  Goal: Effective Communication  Outcome: Ongoing - Unchanged  Goal: Optimal Device Function  Outcome: Ongoing - Unchanged  Intervention: Optimize Device Care and Function  Recent Flowsheet Documentation  Taken 05/05/2024 0245 by Melvia Stacks, RRT  Airway/Ventilation Management:   airway patency maintained   humidification applied  Taken 05/04/2024 2340 by Melvia Stacks, RRT  Airway/Ventilation Management:   airway patency maintained   humidification applied  Goal: Absence of Device-Related Skin or Tissue Injury  Outcome: Ongoing - Unchanged

## 2024-05-05 NOTE — Unmapped (Signed)
 CVAD Liaison Consult    CVAD Liaison Nurse was consulted for education regarding  removal of non-tunneled introducer in left femoral vein. Primary RN at bedside, line removed per protocol, hemostasis achieved. An occlusive dressing was applied over site.        Thank you for this consult,  Shirlene Doughty Audreyana Huntsberry, RN, CVAD Liaison    Consult Time 30 minutes (min)

## 2024-05-05 NOTE — Unmapped (Signed)
 CVAD Liaison - Insertion Note      The CVAD Liaison was contacted for the insertion of Central Venous Access Device (CVAD).  A chart review performed.   Indication: Vasopressors    Prior to the start of the procedure, a time out was performed and the identity of the patient was confirmed via name, medical record number and date of birth.  The Central Line Checklist was referenced.  The sterile field was prepared with necessary supplies and equipment verified.  Insertion site was prepped with chlorhexidine  and allowed to dry.  Maximum sterile techniques was utilized.    CVAD was inserted by Dr. Lawence Press.  Catheter was aspirated and flushed.  After line was placed and secured by provider, the insertion site cleansed and sterile dressing applied per manufacturer guidelines by CVAD Liaison.     CVAD Liaison was present during entire procedure.  Report of the procedure given to the Primary Nurse.      Thank you for this consult,  Shirlene Doughty Monti Villers, RN, CVAD Liaison     Consult Time 60 minutes

## 2024-05-05 NOTE — Unmapped (Signed)
 Problem: Mechanical Ventilation Invasive  Goal: Effective Communication  Outcome: Progressing  Goal: Optimal Device Function  Outcome: Progressing  Intervention: Optimize Device Care and Function  Flowsheets  Taken 05/05/2024 1849  Airway/Ventilation Management:   airway patency maintained   humidification applied   pulmonary hygiene promoted  Airway Safety Measures:   manual resuscitator/mask at bedside   suction at bedside   oxygen flowmeter at bedside   high-efficiency antimicrobial filters maintained  Taken 05/05/2024 0846  Oral Care:   teeth brushed   tongue brushed   suction provided   mouth swabbed  Goal: Mechanical Ventilation Liberation  Outcome: Progressing  Goal: Optimal Nutrition Delivery  Outcome: Progressing  Goal: Absence of Device-Related Skin and Tissue Injury  Outcome: Progressing  Goal: Absence of Ventilator-Induced Lung Injury  Outcome: Progressing  Intervention: Facilitate Lung-Protection Measures  Flowsheets (Taken 05/05/2024 1849)  Lung Protection Measures:   low inspiratory pressure provided   lung compliance monitored   ventilator synchrony promoted  Intervention: Prevent Ventilator-Associated Pneumonia  Flowsheets  Taken 05/05/2024 1849  Head of Bed (HOB) Positioning: HOB at 30 degrees  Taken 05/05/2024 0846  Oral Care:   teeth brushed   tongue brushed   suction provided   mouth swabbed     Problem: Artificial Airway  Goal: Effective Communication  Outcome: Progressing  Goal: Optimal Device Function  Outcome: Progressing  Intervention: Optimize Device Care and Function  Flowsheets  Taken 05/05/2024 1849  Airway/Ventilation Management:   airway patency maintained   humidification applied   pulmonary hygiene promoted  Airway Safety Measures:   manual resuscitator/mask at bedside   suction at bedside   oxygen flowmeter at bedside   high-efficiency antimicrobial filters maintained  Taken 05/05/2024 0846  Oral Care:   teeth brushed   tongue brushed   suction provided   mouth swabbed  Goal: Absence of Device-Related Skin or Tissue Injury  Outcome: Progressing     Problem: Skin Injury Risk Increased  Goal: Skin Health and Integrity  Intervention: Optimize Skin Protection  Recent Flowsheet Documentation  Taken 05/05/2024 1849 by Ivana Maris, RRT  Head of Bed [facility]) Positioning: HOB at 30 degrees     Problem: Wound  Goal: Skin Health and Integrity  Intervention: Optimize Skin Protection  Recent Flowsheet Documentation  Taken 05/05/2024 1849 by Ivana Maris, RRT  Head of Bed [facility]) Positioning: HOB at 30 degrees

## 2024-05-05 NOTE — Unmapped (Signed)
 Care Management  Initial Transition Planning Assessment    Patient lives with relative Ashley Briggs in [place] [facility]) in a 1 level home with 6 steps to enter. At baseline patient is independent with ADLs. Patient receives PT/OT/RN from [facility]. Family member is unsure of [facility] company. DME is cane and RW. Patient will need taxi voucher at DC. Ashley Briggs will assist with basic care at home.                 General  Care Manager / Social Worker assessed the patient by : Medical record review, Discussion with Clinical Care team, Telephone conversation with family (spoke with relative Ashley Briggs)  Orientation Level: Other (Comment) (UTA-intuabted)  Functional level prior to admission: Independent  Reason for referral: Discharge Planning    Contact/Decision Maker  Extended Emergency Contact Information  Primary Emergency Contact: Caudle,Ryan  Mobile Phone: [phone]  Relation: Relative  Secondary Emergency Contact: Hunt,Linda  Mobile Phone: [phone]  Relation: Relative  Preferred language: ENGLISH  Interpreter needed? No    Legal Next of Kin / Guardian / POA / Advance Directives     HCDM (patient stated preference): Caudle,Ryan - Relative - [phone]    Advance Directive (Medical Treatment)  Does patient have an advance directive covering medical treatment?: Unable to assess (Pt. cognitively impaired, and/or unaccompanied).    Health Care Decision Maker [HCDM] (Medical & Mental Health Treatment)  Healthcare Decision Maker: Patient has been deemed unable to make medical decisions, cannot communicate.  Information offered on HCDM, Medical & Mental Health advance directives:: Patient has been deemed unable to make medical decisions, cannot communicate.         Readmission Information    Have you been hospitalized in the last 30 days?: No         Patient Information  Lives with: Family members    Type of Residence: Private residence        Location/Detail: [address] [place] [address]    Support Systems/Concerns: Family Members    Responsibilities/Dependents at home?: No    Home Care services in place prior to admission?: Yes  Type of Home Care services in place prior to admission: Home nursing visits, Home PT, Home OT               Equipment Currently Used at Home: cane, straight, walker, rolling       Currently receiving outpatient dialysis?: No       Financial Information       Need for financial assistance?: No       Social Determinants of Health  Social Drivers of Health     Food Insecurity: No Food Insecurity (05/05/2024)    Hunger Vital Sign     Worried About Running Out of Food in the Last Year: Never true     Ran Out of Food in the Last Year: Never true   Recent Concern: Food Insecurity - Food Insecurity Present (04/19/2024)    Received from [facility]    Hunger Vital Sign     Worried About Running Out of Food in the Last Year: Never true     Ran Out of Food in the Last Year: Sometimes true   Tobacco Use: Low Risk  (05/03/2024)    Received from [facility]    Patient History     Smoking Tobacco Use: Never     Smokeless Tobacco Use: Never     Passive Exposure: Not on file   Recent Concern: Tobacco Use -  Medium Risk (04/26/2024)    Patient History     Smoking Tobacco Use: Never     Smokeless Tobacco Use: Never     Passive Exposure: Current   Transportation Needs: Unmet Transportation Needs (05/05/2024)    PRAPARE - Transportation     Lack of Transportation (Medical): No     Lack of Transportation (Non-Medical): Yes   Alcohol Use: Not At Risk (01/09/2021)    Alcohol Use     How often do you have a drink containing alcohol?: Monthly or less     How many drinks containing alcohol do you have on a typical day when you are drinking?: 1 - 2     How often do you have 5 or more drinks on one occasion?: Never   Housing: Low Risk  (05/05/2024)    Housing     Within the past 12 months, have you ever stayed: outside, in a car, in a tent, in an overnight shelter, or temporarily in someone else's home (i.e. couch-surfing)?: No     Are you worried about losing your housing?: No   Physical Activity: Insufficiently Active (01/09/2021)    Exercise Vital Sign     Days of Exercise per Week: 1 day     Minutes of Exercise per Session: 40 min   Utilities: Low Risk  (05/05/2024)    Utilities     Within the past 12 months, have you been unable to get utilities (heat, electricity) when it was really needed?: No   Stress: Stress Concern Present (09/17/2022)    [employer] of Occupational Health - Occupational Stress Questionnaire     Feeling of Stress : Very much   Interpersonal Safety: Patient Unable To Answer (04/26/2024)    Interpersonal Safety     Unsafe Where You Currently Live: Patient unable to answer     Physically Hurt by Anyone: Patient unable to answer     Abused by Anyone: Patient unable to answer   Substance Use: Not on file (10/06/2023)   Intimate Partner Violence: Patient Declined (04/19/2024)    Received from [facility]    Humiliation, Afraid, Rape, and Kick questionnaire     Fear of Current or Ex-Partner: Patient declined     Emotionally Abused: Patient declined     Physically Abused: Patient declined     Sexually Abused: Patient declined   Social Connections: Socially Isolated (04/19/2024)    Received from [facility]    Social Connection and Isolation Panel [NHANES]     Frequency of Communication with Friends and Family: Once a week     Frequency of Social Gatherings with Friends and Family: Twice a week     Attends Religious Services: Never     Database administrator or Organizations: No     Attends Banker Meetings: Never     Marital Status: Widowed   Physicist, medical Strain: Low Risk  (05/05/2024)    Overall Financial Resource Strain (CARDIA)     Difficulty of Paying Living Expenses: Not very hard   Health Literacy: Low Risk  (01/09/2021)    Health Literacy     : Never   Internet Connectivity: No Internet connectivity concern identified (09/17/2022)    Internet Connectivity     Do you have access to internet services: Yes     How do you connect to the internet: Personal Device at home     Is your internet connection strong enough for you to watch video on your device  without major problems?: Yes     Do you have enough data to get through the month?: Yes     Does at least one of the devices have a camera that you can use for video chat?: Yes       Complex Discharge Information    Is patient identified as a difficult/complex discharge?: No        Discharge Needs Assessment  Concerns to be Addressed: care coordination/care conferences, discharge planning    Clinical Risk Factors: Multiple Diagnoses (Chronic)    Barriers to taking medications: No    Prior overnight hospital stay or ED visit in last 90 days: Yes         Patient's Choice of Community Agency(s): no preference stated (Likely will need time to recover to resume prior level of functioning.)    Anticipated Changes Related to Illness: other (see comments) (Likely will need time to recover to resume prior level of functioning.)    Equipment Needed After Discharge: other (see comments) (CM to follow for DME needs)    Discharge Facility/Level of Care Needs: other (see comments) (CM to follow for DC needs)    Readmission  Risk of Unplanned Readmission Score: UNPLANNED READMISSION SCORE: 25.99%  Predictive Model Details          26% (High)  Factor Value    Calculated 05/05/2024 12:05 29% Number of active inpatient medication orders 52    [place] Risk of Unplanned Readmission Model 12% Number of hospitalizations in last year 3     9% Number of ED visits in last six months 2     8% ECG/EKG order present in last 6 months     7% Charlson Comorbidity Index 7     6% Latest BUN high (32 mg/dL)     6% Diagnosis of electrolyte disorder present     6% Restraint order present in last 6 months     5% Imaging order present in last 6 months     5% Latest hemoglobin low (10.3 g/dL)     4% Age 75     2% Future appointment scheduled     1% Current length of stay 1.268 days     1% Active ulcer inpatient medication order present      Readmitted Within the Last 30 Days? (No if blank)   Patient at risk for readmission?: Yes    Discharge Plan  Screen findings are: Discharge planning needs identified or anticipated (Comment). (CM to follow for DC needs)    Expected Discharge Date: 05/11/2024    Expected Transfer from Critical Care: 05/08/24    Quality data for continuing care services shared with patient and/or representative?: N/A  Patient and/or family were provided with choice of facilities / services that are available and appropriate to meet post hospital care needs?: Other (Comment) (CM will provide choice of facilities/services if deemed medically necessary.)       Initial Assessment complete?: Yes

## 2024-05-05 NOTE — Unmapped (Signed)
 Central Venous Catheter Insertion Procedure Note     Date of Service: 05/05/2024    Patient Name: Ashley Briggs  Patient MRN: [id number]    Line type:  Triple Lumen    Indications:  Medications requiring central access    Consent:  I explained the potential benefits and risks of the procedure, including  catheter malposition, pneumothorax, cardiac complications, thrombosis, hematoma formation, arterial cannulation, air embolism local/catheter-related infection, nerve/tissue damage and sepsis. I explained potential alternatives. The patient/HCDM understands these risks, agrees to the procedure, and signed the informed consent form.    Procedure Details:   Time-out was performed immediately prior to the procedure.    The left internal jugular vein was identified using bedside ultrasound. This area was prepped and draped in the usual sterile fashion. Maximum sterile technique was used including antiseptics, cap, gloves, gown, hand hygiene, mask, and sterile sheet.  The patient was placed in Trendelenburg position. Local anesthesia with 1% lidocaine  was applied subcutaneously then deep to the skin. The finder was then inserted into the internal jugular vein using ultrasound guidance.    Using the Seldinger Technique a Triple Lumen was placed with each port easily flushed and freely drawing venous blood.    The catheter was secured with sutures. A sterile CHG drsg was applied to the site.    Condition:  The patient tolerated the procedure well and remains in the same condition as pre-procedure.    Complications:  None; patient tolerated the procedure well.    Plan:  CXR confirmed appropriate placement of central line. CVAD order placed OK to use     Ashley Allnutt A Truly Stankiewicz, MD

## 2024-05-06 ENCOUNTER — Encounter: Payer: Self-pay | Admitting: Certified Registered Nurse Anesthetist

## 2024-05-06 LAB — BLOOD GAS CRITICAL CARE PANEL, ARTERIAL
BASE EXCESS ARTERIAL: -4.1 — ABNORMAL LOW (ref -2.0–2.0)
BASE EXCESS ARTERIAL: -2.6 — ABNORMAL LOW (ref -2.0–2.0)
CALCIUM IONIZED ARTERIAL (MG/DL): 5.05 mg/dL (ref 4.40–5.40)
CALCIUM IONIZED ARTERIAL (MG/DL): 5.21 mg/dL (ref 4.40–5.40)
FIO2 ARTERIAL: 70
GLUCOSE WHOLE BLOOD: 339 mg/dL — ABNORMAL HIGH (ref 70–179)
GLUCOSE WHOLE BLOOD: 363 mg/dL — ABNORMAL HIGH (ref 70–179)
HCO3 ARTERIAL: 21 mmol/L — ABNORMAL LOW (ref 22–27)
HCO3 ARTERIAL: 22 mmol/L (ref 22–27)
HEMOGLOBIN BLOOD GAS: 10.2 g/dL — ABNORMAL LOW (ref 12.00–16.00)
HEMOGLOBIN BLOOD GAS: 10.3 g/dL — ABNORMAL LOW (ref 12.00–16.00)
LACTATE BLOOD ARTERIAL: 3.3 mmol/L — ABNORMAL HIGH (ref <1.3)
LACTATE BLOOD ARTERIAL: 2.7 mmol/L — ABNORMAL HIGH (ref <1.3)
O2 SATURATION ARTERIAL: 98 % (ref 94.0–100.0)
O2 SATURATION ARTERIAL: 99.8 % (ref 94.0–100.0)
PCO2 ARTERIAL: 41 mmHg (ref 35.0–45.0)
PCO2 ARTERIAL: 41.8 mmHg (ref 35.0–45.0)
PH ARTERIAL: 7.33 — ABNORMAL LOW (ref 7.35–7.45)
PH ARTERIAL: 7.34 — ABNORMAL LOW (ref 7.35–7.45)
PO2 ARTERIAL: 110 mmHg (ref 80.0–110.0)
PO2 ARTERIAL: 146 mmHg — ABNORMAL HIGH (ref 80.0–110.0)
POTASSIUM WHOLE BLOOD: 5.4 mmol/L — ABNORMAL HIGH (ref 3.4–4.6)
POTASSIUM WHOLE BLOOD: 5.2 mmol/L — ABNORMAL HIGH (ref 3.4–4.6)
SODIUM WHOLE BLOOD: 139 mmol/L (ref 135–145)
SODIUM WHOLE BLOOD: 140 mmol/L (ref 135–145)

## 2024-05-06 LAB — AMMONIA: AMMONIA: 65 umol/L — ABNORMAL HIGH (ref 11–32)

## 2024-05-06 LAB — CBC W/ AUTO DIFF
BASOPHILS ABSOLUTE COUNT: 0 10*9/L (ref 0.0–0.1)
BASOPHILS ABSOLUTE COUNT: 0 10*9/L (ref 0.0–0.1)
BASOPHILS ABSOLUTE COUNT: 0.1 10*9/L (ref 0.0–0.1)
BASOPHILS RELATIVE PERCENT: 0.2 %
BASOPHILS RELATIVE PERCENT: 0.2 %
BASOPHILS RELATIVE PERCENT: 0.5 %
EOSINOPHILS ABSOLUTE COUNT: 0 10*9/L (ref 0.0–0.5)
EOSINOPHILS ABSOLUTE COUNT: 0.1 10*9/L (ref 0.0–0.5)
EOSINOPHILS ABSOLUTE COUNT: 0.1 10*9/L (ref 0.0–0.5)
EOSINOPHILS RELATIVE PERCENT: 0.1 %
EOSINOPHILS RELATIVE PERCENT: 0.4 %
EOSINOPHILS RELATIVE PERCENT: 0.9 %
HEMATOCRIT: 30.2 % — ABNORMAL LOW (ref 34.0–44.0)
HEMATOCRIT: 30.4 % — ABNORMAL LOW (ref 34.0–44.0)
HEMATOCRIT: 28.6 % — ABNORMAL LOW (ref 34.0–44.0)
HEMOGLOBIN: 10.4 g/dL — ABNORMAL LOW (ref 11.3–14.9)
HEMOGLOBIN: 10.2 g/dL — ABNORMAL LOW (ref 11.3–14.9)
HEMOGLOBIN: 9.8 g/dL — ABNORMAL LOW (ref 11.3–14.9)
LYMPHOCYTES ABSOLUTE COUNT: 1.2 10*9/L (ref 1.1–3.6)
LYMPHOCYTES ABSOLUTE COUNT: 1.3 10*9/L (ref 1.1–3.6)
LYMPHOCYTES ABSOLUTE COUNT: 1.6 10*9/L (ref 1.1–3.6)
LYMPHOCYTES RELATIVE PERCENT: 7.2 %
LYMPHOCYTES RELATIVE PERCENT: 8.8 %
LYMPHOCYTES RELATIVE PERCENT: 12.9 %
MEAN CORPUSCULAR HEMOGLOBIN CONC: 34.4 g/dL (ref 32.0–36.0)
MEAN CORPUSCULAR HEMOGLOBIN CONC: 33.5 g/dL (ref 32.0–36.0)
MEAN CORPUSCULAR HEMOGLOBIN CONC: 34.4 g/dL (ref 32.0–36.0)
MEAN CORPUSCULAR HEMOGLOBIN: 29.9 pg (ref 25.9–32.4)
MEAN CORPUSCULAR HEMOGLOBIN: 29.1 pg (ref 25.9–32.4)
MEAN CORPUSCULAR HEMOGLOBIN: 29.9 pg (ref 25.9–32.4)
MEAN CORPUSCULAR VOLUME: 86.8 fL (ref 77.6–95.7)
MEAN CORPUSCULAR VOLUME: 86.7 fL (ref 77.6–95.7)
MEAN CORPUSCULAR VOLUME: 86.9 fL (ref 77.6–95.7)
MEAN PLATELET VOLUME: 8 fL (ref 6.8–10.7)
MEAN PLATELET VOLUME: 7.8 fL (ref 6.8–10.7)
MEAN PLATELET VOLUME: 7.5 fL (ref 6.8–10.7)
MONOCYTES ABSOLUTE COUNT: 2.8 10*9/L — ABNORMAL HIGH (ref 0.3–0.8)
MONOCYTES ABSOLUTE COUNT: 2.2 10*9/L — ABNORMAL HIGH (ref 0.3–0.8)
MONOCYTES ABSOLUTE COUNT: 1.9 10*9/L — ABNORMAL HIGH (ref 0.3–0.8)
MONOCYTES RELATIVE PERCENT: 16.4 %
MONOCYTES RELATIVE PERCENT: 15 %
MONOCYTES RELATIVE PERCENT: 15.5 %
NEUTROPHILS ABSOLUTE COUNT: 13.1 10*9/L — ABNORMAL HIGH (ref 1.8–7.8)
NEUTROPHILS ABSOLUTE COUNT: 10.9 10*9/L — ABNORMAL HIGH (ref 1.8–7.8)
NEUTROPHILS ABSOLUTE COUNT: 8.5 10*9/L — ABNORMAL HIGH (ref 1.8–7.8)
NEUTROPHILS RELATIVE PERCENT: 76.1 %
NEUTROPHILS RELATIVE PERCENT: 75.6 %
NEUTROPHILS RELATIVE PERCENT: 70.2 %
PLATELET COUNT: 99 10*9/L — ABNORMAL LOW (ref 150–450)
PLATELET COUNT: 93 10*9/L — ABNORMAL LOW (ref 150–450)
PLATELET COUNT: 89 10*9/L — ABNORMAL LOW (ref 150–450)
RED BLOOD CELL COUNT: 3.48 10*12/L — ABNORMAL LOW (ref 3.95–5.13)
RED BLOOD CELL COUNT: 3.5 10*12/L — ABNORMAL LOW (ref 3.95–5.13)
RED BLOOD CELL COUNT: 3.29 10*12/L — ABNORMAL LOW (ref 3.95–5.13)
RED CELL DISTRIBUTION WIDTH: 16.8 % — ABNORMAL HIGH (ref 12.2–15.2)
RED CELL DISTRIBUTION WIDTH: 17.1 % — ABNORMAL HIGH (ref 12.2–15.2)
RED CELL DISTRIBUTION WIDTH: 16.8 % — ABNORMAL HIGH (ref 12.2–15.2)
WBC ADJUSTED: 17.3 10*9/L — ABNORMAL HIGH (ref 3.6–11.2)
WBC ADJUSTED: 14.4 10*9/L — ABNORMAL HIGH (ref 3.6–11.2)
WBC ADJUSTED: 12.2 10*9/L — ABNORMAL HIGH (ref 3.6–11.2)

## 2024-05-06 LAB — COMPREHENSIVE METABOLIC PANEL
ALBUMIN: 2.9 g/dL — ABNORMAL LOW (ref 3.4–5.0)
ALBUMIN: 3.6 g/dL (ref 3.4–5.0)
ALKALINE PHOSPHATASE: 91 U/L (ref 46–116)
ALKALINE PHOSPHATASE: 87 U/L (ref 46–116)
ALT (SGPT): 267 U/L — ABNORMAL HIGH (ref 10–49)
ALT (SGPT): 324 U/L — ABNORMAL HIGH (ref 10–49)
ANION GAP: 12 mmol/L (ref 5–14)
ANION GAP: 11 mmol/L (ref 5–14)
AST (SGOT): 633 U/L — ABNORMAL HIGH (ref <=34)
AST (SGOT): 816 U/L — ABNORMAL HIGH (ref <=34)
BILIRUBIN TOTAL: 9.6 mg/dL — ABNORMAL HIGH (ref 0.3–1.2)
BILIRUBIN TOTAL: 10.4 mg/dL — ABNORMAL HIGH (ref 0.3–1.2)
BLOOD UREA NITROGEN: 63 mg/dL — ABNORMAL HIGH (ref 9–23)
BLOOD UREA NITROGEN: 70 mg/dL — ABNORMAL HIGH (ref 9–23)
BUN / CREAT RATIO: 42
BUN / CREAT RATIO: 43
CALCIUM: 9 mg/dL (ref 8.7–10.4)
CALCIUM: 9.1 mg/dL (ref 8.7–10.4)
CHLORIDE: 106 mmol/L (ref 98–107)
CHLORIDE: 106 mmol/L (ref 98–107)
CO2: 23 mmol/L (ref 20.0–31.0)
CO2: 24 mmol/L (ref 20.0–31.0)
CREATININE: 1.49 mg/dL — ABNORMAL HIGH (ref 0.55–1.02)
CREATININE: 1.64 mg/dL — ABNORMAL HIGH (ref 0.55–1.02)
EGFR CKD-EPI (2021) FEMALE: 40 mL/min/1.73m2 — ABNORMAL LOW (ref >=60)
EGFR CKD-EPI (2021) FEMALE: 36 mL/min/1.73m2 — ABNORMAL LOW (ref >=60)
GLUCOSE RANDOM: 386 mg/dL — ABNORMAL HIGH (ref 70–179)
GLUCOSE RANDOM: 357 mg/dL — ABNORMAL HIGH (ref 70–99)
POTASSIUM: 5.5 mmol/L — ABNORMAL HIGH (ref 3.4–4.8)
POTASSIUM: 5 mmol/L — ABNORMAL HIGH (ref 3.4–4.8)
PROTEIN TOTAL: 5.3 g/dL — ABNORMAL LOW (ref 5.7–8.2)
PROTEIN TOTAL: 6 g/dL (ref 5.7–8.2)
SODIUM: 141 mmol/L (ref 135–145)
SODIUM: 141 mmol/L (ref 135–145)

## 2024-05-06 LAB — HIGH SENSITIVITY TROPONIN I - SINGLE
HIGH SENSITIVITY TROPONIN I: 9260 ng/L (ref <=34)
HIGH SENSITIVITY TROPONIN I: 14591 ng/L (ref <=34)
HIGH SENSITIVITY TROPONIN I: 19983 ng/L (ref <=34)
HIGH SENSITIVITY TROPONIN I: 23229 ng/L (ref <=34)
HIGH SENSITIVITY TROPONIN I: 19807 ng/L (ref <=34)

## 2024-05-06 LAB — BLOOD GAS, ARTERIAL
BASE EXCESS ARTERIAL: -3.4 — ABNORMAL LOW (ref -2.0–2.0)
FIO2 ARTERIAL: 60
HCO3 ARTERIAL: 21 mmol/L — ABNORMAL LOW (ref 22–27)
O2 SATURATION ARTERIAL: 99.3 % (ref 94.0–100.0)
PCO2 ARTERIAL: 39.9 mmHg (ref 35.0–45.0)
PH ARTERIAL: 7.35 (ref 7.35–7.45)
PO2 ARTERIAL: 148 mmHg — ABNORMAL HIGH (ref 80.0–110.0)

## 2024-05-06 LAB — URINALYSIS WITH MICROSCOPY WITH CULTURE REFLEX PERFORMABLE
BILIRUBIN UA: NEGATIVE
GLUCOSE UA: >1000 — AB
HYALINE CASTS: 10 /LPF — ABNORMAL HIGH (ref 0–1)
KETONES UA: NEGATIVE
LEUKOCYTE ESTERASE UA: NEGATIVE
NITRITE UA: NEGATIVE
PH UA: 5 (ref 5.0–9.0)
PROTEIN UA: NEGATIVE
RBC UA: 6 /HPF — ABNORMAL HIGH (ref <=4)
SPECIFIC GRAVITY UA: 1.016 (ref 1.003–1.030)
SQUAMOUS EPITHELIAL: <1 /HPF (ref 0–5)
UROBILINOGEN UA: <2
WBC UA: 1 /HPF (ref 0–5)

## 2024-05-06 LAB — TRIGLYCERIDES: TRIGLYCERIDES: 126 mg/dL (ref <150)

## 2024-05-06 LAB — HEMOGLOBIN AND HEMATOCRIT, BLOOD
HEMATOCRIT: 29.1 % — ABNORMAL LOW (ref 34.0–44.0)
HEMOGLOBIN: 10.1 g/dL — ABNORMAL LOW (ref 11.3–14.9)

## 2024-05-06 LAB — CYSTATIN C
CYSTATIN C: 3.27 mg/L — ABNORMAL HIGH (ref 0.64–1.23)
EGFR CKD-EPI (2012) CYSTATIN C FEMALE: 15 mL/min/{1.73_m2} — ABNORMAL LOW (ref >=60)

## 2024-05-06 LAB — BASIC METABOLIC PANEL
ANION GAP: 14 mmol/L (ref 5–14)
BLOOD UREA NITROGEN: 56 mg/dL — ABNORMAL HIGH (ref 9–23)
BUN / CREAT RATIO: 42
CALCIUM: 9.1 mg/dL (ref 8.7–10.4)
CHLORIDE: 108 mmol/L — ABNORMAL HIGH (ref 98–107)
CO2: 20 mmol/L (ref 20.0–31.0)
CREATININE: 1.32 mg/dL — ABNORMAL HIGH (ref 0.55–1.02)
EGFR CKD-EPI (2021) FEMALE: 47 mL/min/1.73m2 — ABNORMAL LOW (ref >=60)
GLUCOSE RANDOM: 368 mg/dL — ABNORMAL HIGH (ref 70–179)
POTASSIUM: 5.4 mmol/L — ABNORMAL HIGH (ref 3.4–4.8)
SODIUM: 142 mmol/L (ref 135–145)

## 2024-05-06 LAB — O2 SATURATION VENOUS
O2 SATURATION VENOUS: 82.4 % (ref 40.0–85.0)
O2 SATURATION VENOUS: 83.4 % (ref 40.0–85.0)
O2 SATURATION VENOUS: 84 % (ref 40.0–85.0)

## 2024-05-06 LAB — PLATELET COUNT: PLATELET COUNT: 89 10*9/L — ABNORMAL LOW (ref 150–450)

## 2024-05-06 LAB — SODIUM, URINE, RANDOM: SODIUM URINE: 16 mmol/L

## 2024-05-06 LAB — TSH: THYROID STIMULATING HORMONE: 0.376 u[IU]/mL — ABNORMAL LOW (ref 0.550–4.780)

## 2024-05-06 LAB — PRO-BNP: PRO-BNP: 31729 pg/mL — ABNORMAL HIGH (ref <=300.0)

## 2024-05-06 LAB — FIBRINOGEN: FIBRINOGEN LEVEL: 149 mg/dL — ABNORMAL LOW (ref 175–500)

## 2024-05-06 LAB — MAGNESIUM
MAGNESIUM: 2.4 mg/dL (ref 1.6–2.6)
MAGNESIUM: 2.4 mg/dL (ref 1.6–2.6)

## 2024-05-06 LAB — AST: AST (SGOT): 556 U/L — ABNORMAL HIGH (ref <=34)

## 2024-05-06 LAB — PROTIME-INR
INR: 1.96
INR: 1.66
PROTIME: 22.3 s — ABNORMAL HIGH (ref 9.9–12.6)
PROTIME: 18.9 s — ABNORMAL HIGH (ref 9.9–12.6)

## 2024-05-06 LAB — CREATININE, URINE: CREATININE, URINE: 56.9 mg/dL

## 2024-05-06 LAB — ALT: ALT (SGPT): 229 U/L — ABNORMAL HIGH (ref 10–49)

## 2024-05-06 LAB — UREA NITROGEN, URINE: UREA NITROGEN URINE: 422 mg/dL

## 2024-05-06 MED ADMIN — insulin NPH (HumuLIN,NovoLIN) injection 5 Units: 5 [IU] | SUBCUTANEOUS | @ 15:00:00 | Stop: 2024-05-06

## 2024-05-06 MED ADMIN — perflutren lipid microspheres (DEFINITY) injection 1 mL: 1 mL | INTRAVENOUS | @ 13:00:00 | Stop: 2024-05-06

## 2024-05-06 MED ADMIN — piperacillin-tazobactam (ZOSYN) IVPB (premix) 4.5 g: 4.5 g | INTRAVENOUS | @ 09:00:00 | Stop: 2024-05-11

## 2024-05-06 MED ADMIN — insulin lispro (HumaLOG) injection 0-20 Units: 0-20 [IU] | SUBCUTANEOUS | @ 12:00:00 | Stop: 2024-05-06

## 2024-05-06 MED ADMIN — fentaNYL PF (SUBLIMAZE) (50 mcg/mL) infusion (bag): 0-200 ug/h | INTRAVENOUS | @ 09:00:00 | Stop: 2024-05-19

## 2024-05-06 MED ADMIN — rifAXIMin (XIFAXAN) oral suspension: 550 mg | GASTROENTERAL | @ 03:00:00 | Stop: 2024-05-10

## 2024-05-06 MED ADMIN — fentaNYL (PF) (SUBLIMAZE) injection 50 mcg: 50 ug | INTRAVENOUS | Stop: 2024-05-18

## 2024-05-06 MED ADMIN — amiodarone 360 mg in dextrose 5% 200 mL (1.8 mg/mL) infusion PMB: .5 mg/min | INTRAVENOUS | @ 19:00:00

## 2024-05-06 MED ADMIN — insulin lispro (HumaLOG) injection 9 Units: 9 [IU] | SUBCUTANEOUS | @ 05:00:00 | Stop: 2024-05-06

## 2024-05-06 MED ADMIN — octreotide 500 mcg in sodium chloride 0.9 % 100 mL (5 mcg/mL) infusion: 50 ug/h | INTRAVENOUS | @ 11:00:00 | Stop: 2024-05-06

## 2024-05-06 MED ADMIN — amiodarone 360 mg in dextrose 5% 200 mL (1.8 mg/mL) infusion PMB: .5 mg/min | INTRAVENOUS | @ 07:00:00

## 2024-05-06 MED ADMIN — amiodarone in dextrose,iso-osm (NEXTERONE) 150 mg/100 mL (1.5 mg/mL) IVPB: INTRAVENOUS | @ 01:00:00 | Stop: 2024-05-05

## 2024-05-06 MED ADMIN — insulin NPH (HumuLIN,NovoLIN) injection 15 Units: 15 [IU] | SUBCUTANEOUS | @ 09:00:00 | Stop: 2024-05-06

## 2024-05-06 MED ADMIN — vasopressin 20 units in 100 mL (0.2 units/mL) infusion premade vial: .03 [IU]/min | INTRAVENOUS | @ 12:00:00

## 2024-05-06 MED ADMIN — propofol (DIPRIVAN) infusion 10 mg/mL: 0-50 ug/kg/min | INTRAVENOUS | @ 05:00:00 | Stop: 2024-05-06

## 2024-05-06 MED ADMIN — midazolam (VERSED) injection 2 mg: 2 mg | INTRAVENOUS | @ 02:00:00 | Stop: 2024-05-05

## 2024-05-06 MED ADMIN — NORepinephrine 8 mg in dextrose 5 % 250 mL (32 mcg/mL) infusion PMB: 0-40 ug/min | INTRAVENOUS | @ 09:00:00

## 2024-05-06 MED ADMIN — levETIRAcetam (KEPPRA) injection 750 mg: 750 mg | INTRAVENOUS | @ 02:00:00

## 2024-05-06 MED ADMIN — propofol (DIPRIVAN) infusion 10 mg/mL: 0-50 ug/kg/min | INTRAVENOUS | @ 09:00:00 | Stop: 2024-05-06

## 2024-05-06 MED ADMIN — insulin lispro (HumaLOG) injection 0-20 Units: 0-20 [IU] | SUBCUTANEOUS | @ 02:00:00

## 2024-05-06 MED ADMIN — amiodarone in dextrose,iso-osm (NEXTERONE) 150 mg/100 mL (1.5 mg/mL) IVPB 150 mg: 150 mg | INTRAVENOUS | @ 01:00:00 | Stop: 2024-05-05

## 2024-05-06 MED ADMIN — furosemide (LASIX) injection 80 mg: 80 mg | INTRAVENOUS | @ 15:00:00 | Stop: 2024-05-06

## 2024-05-06 MED ADMIN — pantoprazole (Protonix) injection 40 mg: 40 mg | INTRAVENOUS | @ 02:00:00

## 2024-05-06 MED ADMIN — insulin lispro (HumaLOG) injection 0-20 Units: 0-20 [IU] | SUBCUTANEOUS | @ 21:00:00 | Stop: 2024-05-06

## 2024-05-06 MED ADMIN — propofol (DIPRIVAN) infusion 10 mg/mL: 0-30 ug/kg/min | INTRAVENOUS | @ 20:00:00

## 2024-05-06 MED ADMIN — furosemide (LASIX) injection 40 mg: 40 mg | INTRAVENOUS | @ 01:00:00 | Stop: 2024-05-05

## 2024-05-06 MED ADMIN — amiodarone 360 mg in dextrose 5% 200 mL (1.8 mg/mL) infusion PMB: 1 mg/min | INTRAVENOUS | @ 01:00:00 | Stop: 2024-05-06

## 2024-05-06 MED ADMIN — levETIRAcetam (KEPPRA) injection 750 mg: 750 mg | INTRAVENOUS | @ 12:00:00

## 2024-05-06 MED ADMIN — insulin lispro (HumaLOG) injection 0-20 Units: 0-20 [IU] | SUBCUTANEOUS | @ 16:00:00 | Stop: 2024-05-06

## 2024-05-06 MED ADMIN — lactulose oral solution: 20 g | GASTROENTERAL | @ 20:00:00

## 2024-05-06 MED ADMIN — insulin NPH (HumuLIN,NovoLIN) injection 20 Units: 20 [IU] | SUBCUTANEOUS | @ 21:00:00

## 2024-05-06 MED ADMIN — propofol (DIPRIVAN) infusion 10 mg/mL: 0-30 ug/kg/min | INTRAVENOUS | @ 15:00:00

## 2024-05-06 MED ADMIN — albumin human 25 % 50 g: 50 g | INTRAVENOUS | @ 20:00:00 | Stop: 2024-05-06

## 2024-05-06 MED ADMIN — NORepinephrine 8 mg in dextrose 5 % 250 mL (32 mcg/mL) infusion PMB: 0-40 ug/min | INTRAVENOUS | @ 20:00:00

## 2024-05-06 MED ADMIN — pantoprazole (Protonix) injection 40 mg: 40 mg | INTRAVENOUS | @ 12:00:00

## 2024-05-06 MED ADMIN — vasopressin 20 units in 100 mL (0.2 units/mL) infusion premade vial: .03 [IU]/min | INTRAVENOUS | @ 09:00:00

## 2024-05-06 MED ADMIN — fentaNYL PF (SUBLIMAZE) (50 mcg/mL) infusion (bag): 0-200 ug/h | INTRAVENOUS | @ 02:00:00 | Stop: 2024-05-19

## 2024-05-06 MED ADMIN — midazolam (VERSED) 1 mg/mL injection: INTRAVENOUS | @ 02:00:00 | Stop: 2024-05-05

## 2024-05-06 MED ADMIN — vancomycin (VANCOCIN) 2000 mg IVPB in 500 mL sodium chloride 0.9% (premix): 2000 mg | INTRAVENOUS | @ 21:00:00 | Stop: 2024-05-06

## 2024-05-06 MED ADMIN — piperacillin-tazobactam (ZOSYN) IVPB (premix) 4.5 g: 4.5 g | INTRAVENOUS | @ 02:00:00 | Stop: 2024-05-11

## 2024-05-06 MED ADMIN — furosemide (LASIX) 10 mg/mL injection: INTRAVENOUS | @ 01:00:00 | Stop: 2024-05-05

## 2024-05-06 MED ADMIN — piperacillin-tazobactam (ZOSYN) IVPB (premix) 4.5 g: 4.5 g | INTRAVENOUS | @ 20:00:00 | Stop: 2024-05-11

## 2024-05-06 MED ADMIN — lactulose oral solution: 20 g | GASTROENTERAL | @ 16:00:00

## 2024-05-06 MED ADMIN — rifAXIMin (XIFAXAN) oral suspension: 550 mg | GASTROENTERAL | @ 12:00:00 | Stop: 2024-05-10

## 2024-05-06 NOTE — Unmapped (Signed)
 MICU Daily Progress Note     Date of Service: 05/06/2024    Problem List:   Principal Problem:    Shock    Active Problems:    Type 2 diabetes mellitus, with long-term current use of insulin       Hyperlipidemia    Hypertension    Coronary artery disease involving native heart without angina pectoris    Diabetes mellitus      Esophageal varices in cirrhosis      Hepatic encephalopathy      Calcified cerebral meningioma      Portal hypertension      Other chronic pain    OSA (obstructive sleep apnea)    Stage 3a chronic kidney disease    Hematemesis      Interval history: Ashley Briggs is a 58 y.o. female with NASH cirrhosis (c/b ascites, hepatic encephalopathy, esophageal varices), CAD with prior NSTEMI, CKD who presented to OSH with hematemesis 6 days s/p variceal banding. Emergent TIPS attempted at OSH but not successful. She was transferred to [facility] MICU with 2 pressor shock. EGD 6/4 with significant bleeding from stomach that could not be cleared endoscopically. Acutely increasing pressor requirement on 6/4, ultimately requiring emergent TIPS. She was extubated on 6/5, but re-intubated later in the day for increasing hypoxia. Overnight, she developed sustained wide complex tachycardia. She was stabilized with amiodarone  gtt and diuresed.     24hr events:   - Extubated 6/5, re-intubated later in the day for worsening hypoxemia  - Wide complex tachycardia overnight, stabilized with amiodarone   - Flash pulmonary edema, diuresed   - Stopped octreotide     Neurological   Sedation  Intubated. Sedated with fentanyl  gtt, propofol  gtt  - Triglycerides q96h while on propofol  gtt  - Daily SAT    History of seizure-like activity (stable)  - Continue IV equivalent of home Keppra  750mg  BID    Chronic pain  History of chronic pain, treated with baclofen , naltrexone , oxycodone .   - Hold home baclofen , naltrextone, oxycodone  in the setting of hypotension and sedation    Major depressive disorder (stable)  - Hold home bupoprion, citalopram     Insomnia (stable)  - Hold home zolpidem     PMH calcified cerebral meningioma (stable)  - no intervention at this time    Analgesia: pain adequately controlled  RASS at goal? Yes  [place] Agitation Assessment Scale (RASS): -3-4 (6/6 at 8:00am)    Pulmonary   Intubation and mechanical ventilation  Previously intubated for procedures and altered mental status. Extubated 6/5, re-intubated later the same day for worsening hypoxia. Now continues on PRVC, sedation increased given c/f double triggering.     PRVC  Vt 400, RR 20, PEEP 10, FiO2 40%  ABG 7.35/39.9/148    Pulmonary edema  Increasing hypoxia on 6/5, ultimately requiring re-intubation with FiO2 100%. CXR demonstrated moderate pulmonary edema. Etiology of pulmonary edema most likely cardiogenic given concurrent suspected stress cardiomyopathy, though she is also at risk for ARDS and noncardiogenic pulmonary edema cannot be excluded. She received IV lasix  40mg  with improving oxygen requirement.   - IV lasix  80mg , redose as tolerated    Left pleural effusion   CXR 6/4 remarkable for moderate Left pleural effusion with passive atelectasis. Appears simple on bedside POCUS. Stable on repeat CXR 6/5.   - CTM    Cardiovascular   Undifferentiated shock, c/f mixed hemorrhagic versus cardiogenic shock   Arrived to [facility] MICU in undifferentiated shock requiring 2 pressors. Acutely worsening hypotension on 6/4 requiring fourth  pressor, aggressive fluid resuscitation, and massive transfusion protocol. S/p emergent TIPS, now with decreasing pressor requirement. Shock initially favored to be hemorrhagic, though sepsis could not be excluded. Now with c/f stress cardiomyopathy, may have some component of cardiogenic shock contributing.   - Bcx NGTD x 48 hours  - Ucx pending  - Continue norepi, vaso  - Continue zosyn   - Central venous O2 to further characterize shock    C/f stress cardiomyopathy - Demand Ischemia - Type II NSTEMI  With elevated troponin on admission (5.4k) likely 2/2 demand ischemia in the setting of shock. Echo on admission overall normal with EF > 55%. Developed several episodes of wide complex tachycardia on 6/5. Bedside POCUS with apical ballooning and slightly decreased EF to 45-50%, most concerning for stress cardiomyopathy in the setting of critical illness with hemorrhagic shock. EKG was without acute ischemic changes. Troponin continues to rise (7k -> 9.2k -> 14.5k -> 19.9k). Overall, lower clinical concern for ACS in the absence of EKG changes. Additionally, would expect more impressive troponin peak in the setting of acute kidney disease.   - Cardiology consulted, appreciate recs   - Low concern for true ACS, no need for heparinization or ischemic eval acutely   - Trend troponin to peak    - Ischemic eval outpatient pending clinical course    Wide complex tachycardia - Ventricular tachycardia  Developed VT arrest requiring shock at OSH with initial TIPS attempt. On 6/5 developed several episodes of wide complex tachycardia. EKG with tachycardia to 130s, thought by cardiology to represent a fib with abberancy rather than VT. She was stabilized with amiodarone  bolus followed by amiodarone  gtt.   - Cardiology consulted, appreciate recs   - Amiodarone  gtt   - Consider transition to po amiodarone  pending GI absorption   - Consider long term strategy for rhythm control given decompensated liver disease  - q8h BMP  - Replete K>4, Mg>2    CAD, prior NSTEMI  Prior NSTEMI per chart review. No LHC data available. Unclear if she has history of coronary stent.   - Home aspirin  held ISO bleeding    Renal   AKI, prerenal versus intrarenal   Cr on admission elevated to 1.29 from baseline ~0.8. Cr improved to 0.9 with aggressive fluid resuscitation. Now rising to 1.32. Highest suspicion for ATN given critical illness with shock, though HRS and prerenal etiologies are not excluded.   - Urine lytes to characterize etiology (recently diuresed, will measure FeUrea)  - Management of contributing etiologies as above  - Diuresis as above, would consider nephrology consult should AKI worsen  - q8h BMP     Infectious Disease/Autoimmune   Undifferentiated shock, c/f septic shock - Leukocytosis (resolved)  Presented to [facility] MICU in 2-pressor shock with leukocytosis to 24.9. UA noninfectious, CXR without pneumonia. Bcx NGTD x 24 hours. Most likely infectious source intra-abdominal. SBP considered, unfortunately ascites from paracentesis on 6/4 were not sent for diagnostic studies. No skin findings on exam to suggest SSTI. Femoral line placed at OSH possibly an infectious source, though Bcx have been NGTD. S/p vanc/zosyn . S/p tobramycin  x 1. Overall, infectious workup reassuring against infection, though all infectious sources cannot be ruled out.   - Bcx NGTD x 48 hours  - Ucx negative  - Continue zosyn   - Remove R femoral line from OSH    Cultures:  Blood Culture, Routine (no units)   Date Value   05/04/2024 No Growth at 24 hours   05/04/2024 No Growth at 24  hours     Urine Culture, Comprehensive (no units)   Date Value   05/04/2024 NO GROWTH     WBC (10*9/L)   Date Value   05/06/2024 17.3 (H)     WBC, UA (/HPF)   Date Value   05/04/2024 1            FEN/GI   Refractory portal hypertensive bleeding - Decompensated cirrhosis - Ascites - Hx hepatic encephalopathy  Hx of NASH cirrhosis presenting to OSH with hematemsis 6 days after esophageal variceal banding. EDG at OSH demonstrated bleeding esophageal ulcers. Emergent TIPS at OSH not successful due to inability to access the portal vein, raising concern for portal vein thrombus. EGD on 6/4 demonstrated nonbleeding esophageal ulcers at side of prior banding, stomach completely occluded by blood which could not be cleared endoscopically. With acute decline in Hb and hypotension on 6/4. Darius Edouard attempted with GI, but unsuccessful after numerous attempts. S/p emergent TIPS on 6/4. Paracentesis during TIPS drained 4.7L, unfortunately not sent for any diagnostic studies. Continuing to pass large volume dark red stools, Hb stable. Suspect melena is representative of prior bleeding and not indicative of active GI bleeding. Now with rising LFTs suggesting some degree of liver injury, likely 2/2 shock. Hepatology initiating transplant evaluation.  - S/p emergent TIPS  - Hepatology c/s, appreciate recs  - Maintain fibrinogen > 100 with cryo  - Consider diagnostic paracentesis pending clinical course  - Daily MELLD labs (CMP, PT/INR)  - Home lasix , spironolactone  held d/t hypotension  - Stop octreotide  now s/p TIPS  - Resume home lactulose , rifaximin     Malnutrition Assessment:   Now NPO for several days, NG tube in place. Would consider starting enteral nutrition.  - Nutrition consult       Heme/Coag   Blood loss anemia, likely 2/2 refractory portal hypertensive bleeding (stable)  S/p 3 units pRBCs at OSH, Hb 9.3 on arrival to MICU. S/p massive transfusion protocol on 6/4. Low clinical concern for ongoing blood loss with Hb stable at 10.4 on 6/6, last transfusion 6/4.   - q8h CBC with diff  - Management of GI bleed as above    Thrombocytopenia (improving)  Plts 253k on admission, acute decline to 50k on 6/5. Currently without signs of active bleeding. Improving to plts 99k on 6/6.   - q8h CBC with diff    Endocrine   Insulin -dependent T2DM  A1c 5.6 on admission. Home regimen lantus  60, lispro with meals. Has been on NPH 15 units, requiring 9 units lispro overnight.   - Increase to NPH 20 units BID  - SSI  - q6h BG checks    Integumentary   Psoriasis (stable)  History of psoriasis per chart review. With numerous chronic appearing scattered erythematous plaques with silvery scale. Of note, on Secukinumab  for psoriasis management, which can increase risk for infection.    # Sacral wound, 01/2024, unclear if still present  - WOCN consulted for high risk skin assessment Yes.  - cont pressure mitigating precautions per skin policy    Prophylaxis/LDA/Restraints/Consults   ICU checklist completed: yes (see ICU rounding navigator in Epic)    Patient Lines/Drains/Airways Status       Active Active Lines, Drains, & Airways       Name Placement date Placement time Site Days    ETT  7.5 05/05/24  1810  -- less than 1    ETT  7.5 05/05/24  1819  -- less than 1    CVC Triple Lumen  05/04/24 Left Femoral 05/04/24  0500  Femoral  2    CVC Triple Lumen 05/05/24 Non-tunneled Left Internal jugular 05/05/24  1740  Internal jugular  less than 1    Urethral Catheter 05/04/24  0500  --  2    Peripheral IV 05/04/24 Anterior;Left;Upper Arm 05/04/24  0500  Arm  2    Peripheral IV 05/04/24 Anterior;Left Forearm 05/04/24  0500  Forearm  2    Peripheral IV 05/04/24 Anterior;Right;Upper Arm 05/04/24  0500  Arm  2    Arterial Line 05/04/24 Right Femoral 05/04/24  0800  Femoral  1                  Patient Lines/Drains/Airways Status       Active Wounds       Name Placement date Placement time Site Days    Surgical Site 05/04/24 Abdomen Lower;Right 05/04/24  2300  -- 1    Surgical Site 05/04/24 Neck Right 05/04/24  2300  -- 1    Surgical Site 05/04/24 Groin Right 05/04/24  2300  -- 1                  VTE ppx: SCDs   Bowel regimen: lactulose  and rifaximin    WOCN consult for sacral wounds  - Will remove femoral line given infectious risk 2/2 proximity to large volume stools    Goals of Care     Code Status: Full Code    Public relations account executive Maker:  Ms. Colasanti current decisional capacity for healthcare decision-making is Full capacity. Her designated healthcare decision maker(s) is/are   HCDM (patient stated preference): Caudle,Ryan - Relative - [phone].      Subjective     Extubated yesterday, reintubated later in the day for hypoxemia. Overnight with sustained wide complex tachycardia. Cardiology fellow performed bedside POCUS with concern for bibasilar contractility and possible apical ballooning. No acute ischemic changes found on EKG. Stabilized with amiodarone , received IV lasix  40mg  for pulmonary edema with ~75cc UOP.     Objective     Vitals - past 24 hours  Temp:  [36.4 ??C (97.6 ??F)-37.4 ??C (99.3 ??F)] 36.4 ??C (97.6 ??F)  Pulse:  [84-150] 90  SpO2 Pulse:  [77-130] 87  Resp:  [12-28] 18  BP: (132)/(59) 132/59  FiO2 (%):  [40 %-100 %] 60 %  SpO2:  [87 %-100 %] 98 % Intake/Output  I/O last 3 completed shifts:  In: 6913.2 [I.V.:4334.1; Blood:900; IV Piggyback:1679.2]  Out: 7600 [Urine:2725; Emesis/NG output:25; Drains:4700; Blood:150]     Physical Exam:    General: Ill-appearing female, lying in bed. Intubated and sedated.   HEENT: Mucous membranes dry.  CV: Tachycardic. Regular rhythm.   Pulm: CTAB on anterior exam.  GI: Abd soft, nontender.  Extremities: Pulses weak but palpable. 1+ pitting edema to bilateral lower extremities.   Skin: Diffuse scattered erythematous plaques with silvery scale. No bruising, petechiae, mucocutaneous bleeding.  Neuro: Intubated and sedated. Minimally responsive to voice or touch.     Continuous Infusions:    amiodarone  0.5 mg/min (05/06/24 0645)    fentaNYL  citrate (PF) 50 mcg/mL infusion 200 mcg/hr (05/06/24 0645)    NORepinephrine  bitartrate-NS 18 mcg/min (05/06/24 0645)    octreotide  infusion 50 mcg/hr (05/06/24 0709)    propofol  10 mg/mL infusion 50 mcg/kg/min (05/06/24 0645)    vasopressin  0.03 Units/min (05/06/24 0645)       Scheduled Medications:    insulin  lispro  0-20 Units Subcutaneous ACHS    insulin  NPH  15  Units Subcutaneous Q12H [facility]    lactulose   20 g Enteral tube: gastric QID    levETIRAcetam   750 mg Intravenous Q12H [facility]    pantoprazole  (Protonix ) intravenous solution  40 mg Intravenous BID    piperacillin -tazobactam  4.5 g Intravenous Q8H [facility]    rifAXIMin   550 mg Enteral tube: gastric BID    sodium chloride   10 mL Intravenous Q8H    sodium chloride   10 mL Intravenous Q8H    sodium chloride   10 mL Intravenous Q8H       PRN medications:  dextrose  in water , fentaNYL  (PF) **OR** fentaNYL  (PF), glucagon, glucose, midazolam , phenylephrine     Data/Imaging Review: Reviewed in Epic and personally interpreted on 05/06/2024. See EMR for detailed results.       Vallery Gavel, MS4  Macarthur Savory,  MD

## 2024-05-06 NOTE — Unmapped (Signed)
 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 The patient is seen in consultation at the request of Subhashini Brion Cancel, MD (Medical ICU ([facility])) for decompensated cirrhosis with variceal bleeding.    Patient with significant UGIB likely 2/2 gastric varix that was unable to be controlled endoscopically. Patient underwent successful salvage TIPS and has stopped bleeding. Unfortunately, she has been re-intubated (likely iso worsening HE) and is showing signs of worsening liver and kidney function related to ischemic injury during GI bleed. We expect this will continue to decline and may need to evaluate her for transplant. A significant barrier to transplant at this time is her cardiac status. Cardiology is following.    Recommendations:  Dx  - Daily MELD labs  - Follow up infectious studies  - Appreciate Cardiology input    Tx  - Cont broad spectrum abx for at least 3-5d, agree with antifungal coverage  - Continue lactulose  for 3-5 bowel movements daily and rifaximin      Misc  - May need to consider transplant evaluation in the coming days if liver function worsens (ischemic hit + TIPS), pending cardiac status      MELD 3.0: 27 at 05/06/2024  4:26 AM  MELD-Na: 25 at 05/06/2024  4:26 AM  Calculated from:  Serum Creatinine: 1.32 mg/dL at 12/06/1094  0:45 AM  Serum Sodium: 142 mmol/L (Using max of 137 mmol/L) at 05/06/2024  4:26 AM  Total Bilirubin: 8.8 mg/dL at 4/0/9811  9:14 PM  Serum Albumin : 3.2 g/dL at 06/08/2955  2:13 PM  INR(ratio): 1.96 at 05/06/2024 12:24 AM  Age at listing (hypothetical): 57 years  Sex: Female at 05/06/2024  4:26 AM      Issues Impacting Complexity of Management:  -The patient has the need for intensive monitoring parameter(s) due to high-risk of clinical decline: q4h or more frequent monitoring of hemoglobins to monitor for stability of GI bleeding and frequent monitoring of MELD score to monitor for progression to/progression of acute or acute on chronic liver failure    Recommendations discussed with the patient's primary team. We will continue to follow along with you.    Subjective:   - Re-intubated for worsening mental status/possible aspiration  - Still on levophed   - Hgb stable  - Lfts, Cr, UOP worsening  - Increasing troponin, arhythmia, cardiology eval, started on amio gtt    Objective:   Temp:  [36.4 ??C (97.6 ??F)-37.4 ??C (99.3 ??F)] 37.1 ??C (98.8 ??F)  Pulse:  [84-150] 94  SpO2 Pulse:  [77-130] 95  Resp:  [12-28] 24  BP: (132)/(59) 132/59  FiO2 (%):  [40 %-100 %] 40 %  SpO2:  [87 %-100 %] 96 %    Gen: Ill appearing  HEENT: intubated   Abdomen:  soft, moderately distended     Pertinent Labs & Studies:  -I have reviewed the patient's labs from 05/06/24 which show stable Hgb, worsening LFTs, and stable INR

## 2024-05-06 NOTE — Unmapped (Signed)
 DIVISION OF CARDIOLOGY  [facility] , [place]        Date of Service: 05/06/2024    CARDIOLOGY INITIAL CONSULT  NOTE    Requesting Physician: Alean Hunt, MD   Requesting Service: Medical ICU (MDI)   Consulting Fellow: Dalbert Dubois, MD       Assessment & Recommendations     [name] is a 58 y.o. female with CAD s/p NSTEMI (reportedly no stents), CKD, decompensated MASLD cirrhosis c/b  ascites, hepatic encephalopathy and EV s/p recent banding admitted with UGIB 2/2 bleeding esophageal ulcers s/p unsuccessful TIPS complicated by pulsatile VT s/p shocks requiring transfer to [facility] ICU.  In hemorrhagic shock secondary to gastric bleed on admission requiring MTP now s/p TIPS. Cardiology consulted for arrhythmias and rising troponin.    Likely Stress Cardiomyopathy - Demand Ischemia   Only cardiac history reported to have had NSTEMI without stents in ?2010. Presentation to [facility] with elevated troponin 5499 > 6492 > 5463 attributed to demand ischemia. Echo with normal EF and no structural abnormalities on 6/4. Overnight with WCT and concern for worsening EF on POCUS so cardiology paged. Cardiology POCUS overnight with contractile base and more ballooned apex that appeared slightly depressed 45-50% as well as pulmonary edema and increased oxygen requirement. Repeat troponin increased back to 7157. No acute ischemic changes on EKG even with WCT. Suspect stress cardiomyopathy due to significant stressors with hemorrhagic shock, TIPs etc and unlikely to represent true ACS.  -- Repeat echo with relatively normal EF >55% with some residual atypical motion   -- No role for anticoagulation or anti-platelets iso acute bleeding and likely stress CM   -- Continue supportive treatment with IV Lasix  prn for pulmonary edema   -- No further medication changes   -- Trend troponin to peak   -- May benefit from ischemic evaluation as outpatient should she recover sufficiently       Wide Complex Tachycardia   Reportedly had VT arrest with initial TIPS attempt requiring defibrillation. Overnight, noted to have several episodes of wide complex tachycardia on telemetry as described below. EKG with rates 130s, some concordance but appropriate antegrade R waves in precordial leads so more likely consistent with afib with aberrancy though cannot definitively rule out ventricular tachycardia. Improvement on amiodarone  and has remained in sinus since then.   -- Continue amiodarone  gtt > can transition to 5g PO amiodarone  load as able pending GI absorption   -- Amiodarone  typically does not cause additional hepatic issues in the short term, but will need to consider long term strategy given decompensated liver disease         Baseline 05/06/24       Diagnoses addressed on this consultation:  Stress Cardiomyopathy, acute. Wide Complex Tachycardia, acute.     I discussed the plan with the primary team via Epic chat    Thank you for this consult. This patient was seen and discussed with the consult attending. Please see attending attestation for further insights into management. If any further questions arise please page the cardiology consult pager [phone]) Monday - Friday from 8AM-5PM or the on-call cardiology pager [phone]) nights and weekends.     This note was generated using speech recognition software and may contain homophonic word substitutions or errors.    Subjective:     [name] is a 58 y.o. female with PMH CAD s/p NSTEMI (reportedly no stents), CKD, decompensated MASLD cirrhosis c/b  ascites, hepatic encephalopathy and  EV s/p recent banding admitted with UGIB 2/2 bleeding esophageal ulcers s/p unsuccessful TIPS complicated by pulsatile VT s/p shocks requiring transfer to [facility] [place] ICU.  In hemorrhagic shock secondary to gastric bleed on admission requiring MTP now s/p TIPS. Cardiology consulted for arrhythmias and rising troponin.     Over the course of 6/5 she was extubated and then reintubated shortly thereafter.  Over the course the day with increasing oxygen requirements up to FiO2 90% in the early afternoon.  CXR with increasing pulmonary edema~18:30.      From a cardiac perspective, she came in with a troponin elevation of 5500 that peaked around 6500 thought secondary to demand ischemia in the setting of hemorrhagic shock and recent VT arrest requiring defibrillation at OSH.  In the evening of 6/5, noted to be going in and out of what appeared to be wide-complex arrhythmias.  Bedside POCUS by MICU fellow with concern for worsening cardiac function.  She had echo completed on 6/4 with normal LVEF > 55% and mild MR.  Cardiology called given abnormal heart rhythms and worsening EF on POCUS.  Personal bedside POCUS with interval slight decrease in EF with more basilar contractility and what appears to be apical ballooning that could be consistent with stress cardiomyopathy in the setting of recent shock.  However, cannot exclude LAD disease given she has known CAD of unknown vessels (previous LHC not available in chart).  EKGs at bedside demonstrate wide-complex tachycardia in the 130s with axis flip in 2, 3 and aVF.  However no concordance or NW axis and given rates in the 130s with stable BP during prolonged runs of 20-30 seconds, suspect more likely to be A-fib with aberrancy than true VT.  Did have some ST depressions in V4-V5 approximately 1-2 mm but no ST elevation. Repeat troponin now trending back up to 7200. Patient was stabilized with amiodarone  without further arrhythmias noted on telemetry.    Overnight troponin continued to trend up > 14591 > 19983 but stable hgb and pressor requirement. Diuresed with good effect and imporvement in oxygen requirements down to 40%.     Cardiovascular History:  -- CAD    Pertinent Medications:  -- amiodarone     Objective:     Temp:  [36.4 ??C (97.6 ??F)-37.4 ??C (99.3 ??F)] 37.1 ??C (98.8 ??F)  Pulse:  [84-150] 94  SpO2 Pulse:  [77-130] 95  Resp: [12-28] 24  BP: (132)/(59) 132/59  FiO2 (%):  [40 %-100 %] 40 %  SpO2:  [87 %-100 %] 96 % A BP-2: (87-147)/(40-69) 105/47  MAP:  [60 mmHg-96 mmHg] 67 mmHg     Gen: Chronically ill appearing woman in NAD, intubated/sedated   HENT: ETT in place, oral blood present  Heart: borderline tachycardic, regular rhythm, no murmurs  Lungs: coarse intubated breath sounds, anterior crackles   Extremities: 1+ pitting BLE edema, pulses are +2 in BUE  Neuro: sedated  Skin:  psoriatic lesions across BLE  Psych: Alert, oriented, normal mood and affect.      I have personally reviewed the following:  Pertinent notes from the primary and consulting services including HPI and hospital course.  Pertinent labs, including hemoglobin, troponin   Echocardiogram(s)  Chest X-ray(s)    I have independently interpreted the following:  Most recent ECG, notable for Sinus rhythm with nearly IVD of left bundle and nonspecific ST changes .  Most recent echocardiogram, notable for relatively preserved EF in an atypical stress type pattern .  Most recent CXR, notable for moderate pulmonary  edema.    I have recommended the primary team order pertinent cardiac tests as detailed above in the assessment and plan.

## 2024-05-06 NOTE — Unmapped (Signed)
 VIR Progress Note    Subjective:    Ashley Briggs is a 58 y.o. female status post salvage TIPS (reduction of portosystemic gradient from 14 mmHg to 9 mmHg) and paracentesis (removal of 4.7 L). Patient extubated yesterday but required reintubation due to pulmonary edema. Patient has recurrent wide complex tachycardia similar to her episode at previous hospital and rising troponin for which echo was performed showing possible stress cardiomyopathy. Cardiology has been consulted.    Objective:    Vitals:    05/06/24 0745   BP:    Pulse: 94   Resp: 24   Temp: 37.1 ??C (98.8 ??F)   SpO2: 96%      Physical Exam:  General: Sedated  Resp: Nonlabored breathing on vent  Right neck, abdomen, and groin access site dressings are clean dry and intact    MELD 3.0: 24 at 05/06/2024 12:24 AM  MELD-Na: 22 at 05/06/2024 12:24 AM  Calculated from:  Serum Creatinine: 1.03 mg/dL at 01/31/2950  8:84 PM  Serum Sodium: 142 mmol/L (Using max of 137 mmol/L) at 05/05/2024  8:38 PM  Total Bilirubin: 8.8 mg/dL at 12/06/6061  0:16 PM  Serum Albumin : 3.2 g/dL at 0/12/930  3:55 PM  INR(ratio): 1.96 at 05/06/2024 12:24 AM  Age at listing (hypothetical): 57 years  Sex: Female at 05/06/2024 12:24 AM    Labs:  All lab results last 24 hours:    Recent Results (from the past 24 hours)   Fibrinogen    Collection Time: 05/05/24  8:12 AM   Result Value Ref Range    Fibrinogen 171 (L) 175 - 500 mg/dL   Hepatic Function Panel    Collection Time: 05/05/24  8:12 AM   Result Value Ref Range    Albumin  2.9 (L) 3.4 - 5.0 g/dL    Total Protein 5.0 (L) 5.7 - 8.2 g/dL    Total Bilirubin 6.6 (H) 0.3 - 1.2 mg/dL    Bilirubin, Direct 7.32 (H) 0.00 - 0.30 mg/dL    AST 202 (H) <=54 U/L    ALT 217 (H) 10 - 49 U/L    Alkaline Phosphatase 87 46 - 116 U/L   CBC w/ Differential    Collection Time: 05/05/24  8:12 AM   Result Value Ref Range    WBC 13.0 (H) 3.6 - 11.2 10*9/L    RBC 3.47 (L) 3.95 - 5.13 10*12/L    HGB 10.3 (L) 11.3 - 14.9 g/dL    HCT 27.0 (L) 62.3 - 44.0 %    MCV 85.3 77.6 - 95.7 fL    MCH 29.6 25.9 - 32.4 pg    MCHC 34.7 32.0 - 36.0 g/dL    RDW 76.2 (H) 83.1 - 15.2 %    MPV 7.8 6.8 - 10.7 fL    Platelet 65 (L) 150 - 450 10*9/L    Neutrophils % 86.1 %    Lymphocytes % 4.5 %    Monocytes % 9.3 %    Eosinophils % 0.0 %    Basophils % 0.1 %    Absolute Neutrophils 11.2 (H) 1.8 - 7.8 10*9/L    Absolute Lymphocytes 0.6 (L) 1.1 - 3.6 10*9/L    Absolute Monocytes 1.2 (H) 0.3 - 0.8 10*9/L    Absolute Eosinophils 0.0 0.0 - 0.5 10*9/L    Absolute Basophils 0.0 0.0 - 0.1 10*9/L    Anisocytosis Slight (A) Not Present   Prepare Plasma    Collection Time: 05/05/24  8:32 AM   Result Value Ref Range  PRODUCT CODE E2555V00     Product ID FFP     Spec Expiration [id number]     Status Released     Unit # [id number]     Unit Blood Type A Pos     ISBT Number 6200     PRODUCT CODE [id number]     Product ID FFP     Spec Expiration [id number]     Status Transfused     Unit # [id number]     Unit Blood Type A Pos     ISBT Number 6200     PRODUCT CODE [id number]     Product ID FFP     Spec Expiration [id number]     Status Released     Unit # [id number]     Unit Blood Type A Pos     ISBT Number 6200     PRODUCT CODE E7750V00     Product ID FFP     Spec Expiration [id number]     Status Released     Unit # [id number]     Unit Blood Type A Pos     ISBT Number 6200     PRODUCT CODE [id number]     Product ID FFP     Spec Expiration [id number]     Status Released     Unit # [id number]     Unit Blood Type A Pos     ISBT Number 6200     PRODUCT CODE [id number]     Product ID FFP     Spec Expiration [id number]     Status Released     Unit # [id number]     Unit Blood Type A Pos     ISBT Number 6200    Fibrinogen    Collection Time: 05/05/24 12:09 PM   Result Value Ref Range    Fibrinogen 176 175 - 500 mg/dL   CBC w/ Differential    Collection Time: 05/05/24 12:09 PM   Result Value Ref Range    WBC 13.7 (H) 3.6 - 11.2 10*9/L    RBC 3.38 (L) 3.95 - 5.13 10*12/L    HGB 10.1 (L) 11.3 - 14.9 g/dL    HCT 38.2 (L) 50.5 - 44.0 %    MCV 84.5 77.6 - 95.7 fL    MCH 30.0 25.9 - 32.4 pg    MCHC 35.5 32.0 - 36.0 g/dL    RDW 39.7 (H) 67.3 - 15.2 %    MPV 7.7 6.8 - 10.7 fL    Platelet 68 (L) 150 - 450 10*9/L    Neutrophils % 83.9 %    Lymphocytes % 4.3 %    Monocytes % 11.6 %    Eosinophils % 0.0 %    Basophils % 0.2 %    Absolute Neutrophils 11.5 (H) 1.8 - 7.8 10*9/L    Absolute Lymphocytes 0.6 (L) 1.1 - 3.6 10*9/L    Absolute Monocytes 1.6 (H) 0.3 - 0.8 10*9/L    Absolute Eosinophils 0.0 0.0 - 0.5 10*9/L    Absolute Basophils 0.0 0.0 - 0.1 10*9/L    Anisocytosis Slight (A) Not Present   POCT Glucose    Collection Time: 05/05/24 12:11 PM   Result Value Ref Range    Glucose, POC 255 (H) 70 - 179 mg/dL   Prepare Platelet Pheresis    Collection Time: 05/05/24  1:00 PM   Result Value Ref Range    PRODUCT CODE [id number]     Product ID Platelets  Spec Expiration [id number]     Status Transfused     Unit # [id number]     Unit Blood Type O Pos     ISBT Number 5100    Prepare RBC    Collection Time: 05/05/24  1:00 PM   Result Value Ref Range    PRODUCT CODE E0336V00     Product ID Red Blood Cells     Spec Expiration [id number]     Status Transfused     Unit # [id number]     Unit Blood Type A Pos     ISBT Number 6200     Crossmatch Compatible     PRODUCT CODE E0336V00     Product ID Red Blood Cells     Spec Expiration [id number]     Status Transfused     Unit # [id number]     Unit Blood Type A Pos     ISBT Number 6200     Crossmatch Compatible     PRODUCT CODE E0336V00     Product ID Red Blood Cells     Spec Expiration [id number]     Status Transfused     Unit # [id number]     Unit Blood Type A Pos     ISBT Number 6200     Crossmatch Compatible     PRODUCT CODE E0336V00     Product ID Red Blood Cells     Spec Expiration [id number]     Status Transfused     Unit # [id number]     Unit Blood Type A Pos     ISBT Number 6200     Crossmatch Compatible     PRODUCT CODE E0336V00     Product ID Red Blood Cells     Spec Expiration [id number]     Status Transfused     Unit # [id number]     Unit Blood Type A Pos     ISBT Number 6200     Crossmatch Compatible    Prepare RBC    Collection Time: 05/05/24  1:00 PM   Result Value Ref Range    PRODUCT CODE E0336V00     Product ID Red Blood Cells     Spec Expiration [id number]     Status Transfused     Unit # [id number]     Unit Blood Type A Pos     ISBT Number 6200     Crossmatch Compatible     PRODUCT CODE E0336V00     Product ID Red Blood Cells     Spec Expiration [id number]     Status Released     Unit # [id number]     Unit Blood Type A Pos     ISBT Number 6200     Crossmatch Compatible     PRODUCT CODE E0336V00     Product ID Red Blood Cells     Spec Expiration [id number]     Status Released     Unit # [id number]     Unit Blood Type A Pos     ISBT Number 6200     Crossmatch Compatible     PRODUCT CODE E0336V00     Product ID Red Blood Cells     Spec Expiration [id number]     Status Transfused     Unit # [id number]     Unit Blood Type A Pos     ISBT Number 6200     Crossmatch Compatible     PRODUCT CODE W2637C58  Product ID Red Blood Cells     Spec Expiration [id number]     Status Transfused     Unit # [id number]     Unit Blood Type A Pos     ISBT Number 6200     Crossmatch Compatible     PRODUCT CODE E0336V00     Product ID Red Blood Cells     Spec Expiration [id number]     Status Transfused     Unit # [id number]     Unit Blood Type A Pos     ISBT Number 6200     Crossmatch Compatible    Prepare RBC    Collection Time: 05/05/24  1:00 PM   Result Value Ref Range    PRODUCT CODE E0336V00     Product ID Red Blood Cells     Spec Expiration [id number]     Status Transfused     Unit # [id number]     Unit Blood Type A Pos     ISBT Number 6200     Crossmatch Compatible    Prepare Plasma    Collection Time: 05/05/24  1:00 PM   Result Value Ref Range    PRODUCT CODE E2462V00     Product ID FFP     Spec Expiration [id number]     Status Transfused     Unit # [id number]     Unit Blood Type A Neg     ISBT Number 0600     PRODUCT CODE E2462V00     Product ID FFP     Spec Expiration [id number]     Status Transfused     Unit # [id number]     Unit Blood Type AB Pos     ISBT Number 8400     PRODUCT CODE E2462V00     Product ID FFP     Spec Expiration [id number]     Status Transfused     Unit # [id number]     Unit Blood Type AB Pos     ISBT Number 8400     PRODUCT CODE E2462V00     Product ID FFP     Spec Expiration [id number]     Status Transfused     Unit # [id number]     Unit Blood Type AB Neg     ISBT Number 2800     PRODUCT CODE E2462V00     Product ID FFP     Spec Expiration [id number]     Status Transfused     Unit # [id number]     Unit Blood Type AB Pos     ISBT Number 8400     PRODUCT CODE E2684V00     Product ID FFP     Spec Expiration [id number]     Status Transfused     Unit # [id number]     Unit Blood Type A Pos     ISBT Number 6200    Fibrinogen    Collection Time: 05/05/24  2:43 PM   Result Value Ref Range    Fibrinogen 165 (L) 175 - 500 mg/dL   CBC w/ Differential    Collection Time: 05/05/24  2:43 PM   Result Value Ref Range    WBC 12.3 (H) 3.6 - 11.2 10*9/L    RBC 3.15 (L) 3.95 - 5.13 10*12/L    HGB 9.3 (L) 11.3 - 14.9 g/dL    HCT 41.9 (L) 37.9 - 44.0 %    MCV 84.5 77.6 - 95.7 fL  MCH 29.4 25.9 - 32.4 pg    MCHC 34.8 32.0 - 36.0 g/dL    RDW 16.1 (H) 09.6 - 15.2 %    MPV 7.7 6.8 - 10.7 fL    Platelet 64 (L) 150 - 450 10*9/L    Neutrophils % 83.8 %    Lymphocytes % 4.4 %    Monocytes % 11.7 %    Eosinophils % 0.0 %    Basophils % 0.1 %    Absolute Neutrophils 10.3 (H) 1.8 - 7.8 10*9/L    Absolute Lymphocytes 0.5 (L) 1.1 - 3.6 10*9/L    Absolute Monocytes 1.4 (H) 0.3 - 0.8 10*9/L    Absolute Eosinophils 0.0 0.0 - 0.5 10*9/L    Absolute Basophils 0.0 0.0 - 0.1 10*9/L    Anisocytosis Slight (A) Not Present   Blood Gas Critical Care Panel, Arterial    Collection Time: 05/05/24 3:45 PM   Result Value Ref Range    Specimen Source Arterial     FIO2 Arterial Not Specified     pH, Arterial 7.45 7.35 - 7.45    pCO2, Arterial 26.8 (L) 35.0 - 45.0 mm Hg    pO2, Arterial 49.5 (L) 80.0 - 110.0 mm Hg    HCO3 (Bicarbonate), Arterial 18 (L) 22 - 27 mmol/L    Base Excess, Arterial -5.2 (L) -2.0 - 2.0    O2 Sat, Arterial 89.2 (L) 94.0 - 100.0 %    Sodium Whole Blood 142 135 - 145 mmol/L    Potassium, Bld 5.0 (H) 3.4 - 4.6 mmol/L    Calcium , Ionized Arterial 4.85 4.40 - 5.40 mg/dL    Glucose Whole Blood 266 (H) 70 - 179 mg/dL    Lactate, Arterial 5.1 (HH) <1.3 mmol/L    Hgb, blood gas 9.00 (L) 12.00 - 16.00 g/dL   Carboxyhemoglobin/POC    Collection Time: 05/05/24  5:49 PM   Result Value Ref Range    Carboxyhemoglobin 1.1 <1.2 %   GLUCOSE-BG/POC    Collection Time: 05/05/24  5:49 PM   Result Value Ref Range    Glucose Whole Blood 269 (H) 70 - 179 mg/dL   POCT Arterial Blood Gas    Collection Time: 05/05/24  5:49 PM   Result Value Ref Range    pH, Arterial 7.45 7.35 - 7.45    pCO2, Arterial 29.0 (L) 35.0 - 45.0 mm[Hg]    pO2, Arterial 45 (L) 80 - 110 mm[Hg]    HCO3 (Bicarbonate), Arterial 20.1 (L) 22.0 - 27.0 mmol/L    Base Excess, Arterial -3.9 (L) -2.0 - 2.0 mmol/L    O2 Sat, Arterial 82.8 (L) 94.0 - 100.0 %    Specimen Type, Arterial Arterial     FIO2 100 %   HEMOGLOBIN BG/POC    Collection Time: 05/05/24  5:49 PM   Result Value Ref Range    Hemoglobin 9.5 (L) 12.0 - 16.0 g/dL   POCT Ionized Calcium     Collection Time: 05/05/24  5:49 PM   Result Value Ref Range    Ionized Calcium  4.7 4.4 - 5.4 mg/dL   POTASSIUM-BG/POC    Collection Time: 05/05/24  5:49 PM   Result Value Ref Range    Potassium, Bld 5.1 (H) 3.4 - 4.6 mmol/L   POCT Lactic Acid (Lactate), Arterial    Collection Time: 05/05/24  5:49 PM   Result Value Ref Range    Lactate, Arterial 5.4 (HH) <1.3 mmol/L   METHEMOGLOBIN/POC    Collection Time: 05/05/24  5:49 PM  Result Value Ref Range    Methemoglobin <1.0 <1.5 %   SODIUM-BG/POC    Collection Time: 05/05/24  5:49 PM   Result Value Ref Range    Sodium Whole Blood 141 135 - 145 mmol/L   POCT Arterial Oxyhemoglobin    Collection Time: 05/05/24  5:49 PM   Result Value Ref Range    Oxyhemoglobin 81.1 (L) 94.0 - 100.0 %   PT-INR    Collection Time: 05/05/24  7:27 PM   Result Value Ref Range    PT 22.9 (H) 9.9 - 12.6 sec    INR 2.01    CBC w/ Differential    Collection Time: 05/05/24  7:27 PM   Result Value Ref Range    WBC 14.4 (H) 3.6 - 11.2 10*9/L    RBC 3.41 (L) 3.95 - 5.13 10*12/L    HGB 10.2 (L) 11.3 - 14.9 g/dL    HCT 96.2 (L) 95.2 - 44.0 %    MCV 86.2 77.6 - 95.7 fL    MCH 30.0 25.9 - 32.4 pg    MCHC 34.8 32.0 - 36.0 g/dL    RDW 84.1 (H) 32.4 - 15.2 %    MPV 8.6 6.8 - 10.7 fL    Platelet 82 (L) 150 - 450 10*9/L    Neutrophils % 76.9 %    Lymphocytes % 6.7 %    Monocytes % 16.2 %    Eosinophils % 0.0 %    Basophils % 0.2 %    Absolute Neutrophils 11.1 (H) 1.8 - 7.8 10*9/L    Absolute Lymphocytes 1.0 (L) 1.1 - 3.6 10*9/L    Absolute Monocytes 2.3 (H) 0.3 - 0.8 10*9/L    Absolute Eosinophils 0.0 0.0 - 0.5 10*9/L    Absolute Basophils 0.0 0.0 - 0.1 10*9/L    Anisocytosis Slight (A) Not Present   Blood Gas Critical Care Panel, Arterial    Collection Time: 05/05/24  7:27 PM   Result Value Ref Range    Specimen Source Arterial     FIO2 Arterial 100%     pH, Arterial 7.33 (L) 7.35 - 7.45    pCO2, Arterial 38.8 35.0 - 45.0 mm Hg    pO2, Arterial 147.0 (H) 80.0 - 110.0 mm Hg    HCO3 (Bicarbonate), Arterial 20 (L) 22 - 27 mmol/L    Base Excess, Arterial -4.8 (L) -2.0 - 2.0    O2 Sat, Arterial 98.9 94.0 - 100.0 %    Sodium Whole Blood 143 135 - 145 mmol/L    Potassium, Bld 5.4 (H) 3.4 - 4.6 mmol/L    Calcium , Ionized Arterial 5.21 4.40 - 5.40 mg/dL    Glucose Whole Blood 291 (H) 70 - 179 mg/dL    Lactate, Arterial 4.1 (HH) <1.3 mmol/L    Hgb, blood gas 10.30 (L) 12.00 - 16.00 g/dL   Magnesium  Level    Collection Time: 05/05/24  8:38 PM   Result Value Ref Range    Magnesium  2.3 1.6 - 2.6 mg/dL   Basic Metabolic Panel    Collection Time: 05/05/24  8:38 PM   Result Value Ref Range    Sodium 142 135 - 145 mmol/L    Potassium 5.0 (H) 3.4 - 4.8 mmol/L    Chloride 109 (H) 98 - 107 mmol/L    CO2 20.0 20.0 - 31.0 mmol/L    Anion Gap 13 5 - 14 mmol/L    BUN 48 (H) 9 - 23 mg/dL    Creatinine 4.01 (H) 0.55 -  1.02 mg/dL    BUN/Creatinine Ratio 47     eGFR CKD-EPI (2021) Female 63 >=60 mL/min/1.9m2    Glucose 287 (H) 70 - 179 mg/dL    Calcium  9.0 8.7 - 10.4 mg/dL   hsTroponin I (single, no delta)    Collection Time: 05/05/24  8:38 PM   Result Value Ref Range    hsTroponin I 7,157 (HH) <=34 ng/L   Hepatic Function Panel    Collection Time: 05/05/24  8:38 PM   Result Value Ref Range    Albumin  3.2 (L) 3.4 - 5.0 g/dL    Total Protein 5.4 (L) 5.7 - 8.2 g/dL    Total Bilirubin 8.8 (H) 0.3 - 1.2 mg/dL    Bilirubin, Direct 1.61 (H) 0.00 - 0.30 mg/dL    AST 096 (H) <=04 U/L    ALT 224 (H) 10 - 49 U/L    Alkaline Phosphatase 88 46 - 116 U/L   Triglycerides    Collection Time: 05/05/24  8:38 PM   Result Value Ref Range    Triglycerides 126 <150 mg/dL   Carboxyhemoglobin/POC    Collection Time: 05/05/24  8:55 PM   Result Value Ref Range    Carboxyhemoglobin <1.0 <1.2 %   GLUCOSE-BG/POC    Collection Time: 05/05/24  8:55 PM   Result Value Ref Range    Glucose Whole Blood 278 (H) 70 - 179 mg/dL   POCT Arterial Blood Gas    Collection Time: 05/05/24  8:55 PM   Result Value Ref Range    pH, Arterial 7.28 (L) 7.35 - 7.45    pCO2, Arterial 44.9 35.0 - 45.0 mm[Hg]    pO2, Arterial 69 (L) 80 - 110 mm[Hg]    HCO3 (Bicarbonate), Arterial 21.1 (L) 22.0 - 27.0 mmol/L    Base Excess, Arterial -5.7 (L) -2.0 - 2.0 mmol/L    O2 Sat, Arterial 91.4 (L) 94.0 - 100.0 %    Specimen Type, Arterial Arterial     FIO2 100 %   HEMOGLOBIN BG/POC    Collection Time: 05/05/24  8:55 PM   Result Value Ref Range    Hemoglobin 10.4 (L) 12.0 - 16.0 g/dL   POCT Ionized Calcium     Collection Time: 05/05/24  8:55 PM   Result Value Ref Range    Ionized Calcium  4.8 4.4 - 5.4 mg/dL POTASSIUM-BG/POC    Collection Time: 05/05/24  8:55 PM   Result Value Ref Range    Potassium, Bld 5.0 (H) 3.4 - 4.6 mmol/L   POCT Lactic Acid (Lactate), Arterial    Collection Time: 05/05/24  8:55 PM   Result Value Ref Range    Lactate, Arterial 3.5 (H) <1.3 mmol/L   METHEMOGLOBIN/POC    Collection Time: 05/05/24  8:55 PM   Result Value Ref Range    Methemoglobin 1.1 <1.5 %   SODIUM-BG/POC    Collection Time: 05/05/24  8:55 PM   Result Value Ref Range    Sodium Whole Blood 142 135 - 145 mmol/L   POCT Arterial Oxyhemoglobin    Collection Time: 05/05/24  8:55 PM   Result Value Ref Range    Oxyhemoglobin 89.6 (L) 94.0 - 100.0 %   POCT Glucose    Collection Time: 05/05/24 10:12 PM   Result Value Ref Range    Glucose, POC 298 (H) 70 - 179 mg/dL   Carboxyhemoglobin/POC    Collection Time: 05/05/24 10:18 PM   Result Value Ref Range    Carboxyhemoglobin <1.0 <1.2 %   GLUCOSE-BG/POC  Collection Time: 05/05/24 10:18 PM   Result Value Ref Range    Glucose Whole Blood 303 (H) 70 - 179 mg/dL   POCT Arterial Blood Gas    Collection Time: 05/05/24 10:18 PM   Result Value Ref Range    pH, Arterial 7.27 (L) 7.35 - 7.45    pCO2, Arterial 45.6 (H) 35.0 - 45.0 mm[Hg]    pO2, Arterial 90 80 - 110 mm[Hg]    HCO3 (Bicarbonate), Arterial 20.8 (L) 22.0 - 27.0 mmol/L    Base Excess, Arterial -6.2 (L) -2.0 - 2.0 mmol/L    O2 Sat, Arterial 96.1 94.0 - 100.0 %    Specimen Type, Arterial Arterial     FIO2 100 %   HEMOGLOBIN BG/POC    Collection Time: 05/05/24 10:18 PM   Result Value Ref Range    Hemoglobin 10.2 (L) 12.0 - 16.0 g/dL   POCT Ionized Calcium     Collection Time: 05/05/24 10:18 PM   Result Value Ref Range    Ionized Calcium  4.8 4.4 - 5.4 mg/dL   POTASSIUM-BG/POC    Collection Time: 05/05/24 10:18 PM   Result Value Ref Range    Potassium, Bld 5.1 (H) 3.4 - 4.6 mmol/L   POCT Lactic Acid (Lactate), Arterial    Collection Time: 05/05/24 10:18 PM   Result Value Ref Range    Lactate, Arterial 3.4 (H) <1.3 mmol/L   METHEMOGLOBIN/POC Collection Time: 05/05/24 10:18 PM   Result Value Ref Range    Methemoglobin 1.2 <1.5 %   SODIUM-BG/POC    Collection Time: 05/05/24 10:18 PM   Result Value Ref Range    Sodium Whole Blood 142 135 - 145 mmol/L   POCT Arterial Oxyhemoglobin    Collection Time: 05/05/24 10:18 PM   Result Value Ref Range    Oxyhemoglobin 94.1 94.0 - 100.0 %   hsTroponin I (single, no delta)    Collection Time: 05/05/24 10:25 PM   Result Value Ref Range    hsTroponin I 7,380 (HH) <=34 ng/L   ALT    Collection Time: 05/05/24 10:25 PM   Result Value Ref Range    ALT 229 (H) 10 - 49 U/L   AST    Collection Time: 05/05/24 10:25 PM   Result Value Ref Range    AST 556 (H) <=34 U/L   TSH    Collection Time: 05/05/24 10:25 PM   Result Value Ref Range    TSH 0.376 (L) 0.550 - 4.780 uIU/mL   ECG 12 Lead    Collection Time: 05/05/24 11:55 PM   Result Value Ref Range    EKG Systolic BP  mmHg    EKG Diastolic BP  mmHg    EKG Ventricular Rate 100 BPM    EKG Atrial Rate 100 BPM    EKG P-R Interval 184 ms    EKG QRS Duration 96 ms    EKG Q-T Interval 370 ms    EKG QTC Calculation 477 ms    EKG Calculated P Axis 58 degrees    EKG Calculated R Axis 70 degrees    EKG Calculated T Axis 35 degrees    QTC Fredericia 439 ms   Blood Gas Critical Care Panel, Arterial    Collection Time: 05/06/24 12:24 AM   Result Value Ref Range    Specimen Source Arterial     FIO2 Arterial Not Specified     pH, Arterial 7.33 (L) 7.35 - 7.45    pCO2, Arterial 41.0 35.0 - 45.0 mm Hg  pO2, Arterial 110.0 80.0 - 110.0 mm Hg    HCO3 (Bicarbonate), Arterial 21 (L) 22 - 27 mmol/L    Base Excess, Arterial -4.1 (L) -2.0 - 2.0    O2 Sat, Arterial 98.0 94.0 - 100.0 %    Sodium Whole Blood 139 135 - 145 mmol/L    Potassium, Bld 5.4 (H) 3.4 - 4.6 mmol/L    Calcium , Ionized Arterial 5.05 4.40 - 5.40 mg/dL    Glucose Whole Blood 339 (H) 70 - 179 mg/dL    Lactate, Arterial 3.3 (H) <1.3 mmol/L    Hgb, blood gas 10.20 (L) 12.00 - 16.00 g/dL   PT-INR    Collection Time: 05/06/24 12:24 AM Result Value Ref Range    PT 22.3 (H) 9.9 - 12.6 sec    INR 1.96    hsTroponin I (single, no delta)    Collection Time: 05/06/24 12:24 AM   Result Value Ref Range    hsTroponin I 9,260 (HH) <=34 ng/L   Pro-BNP    Collection Time: 05/06/24 12:24 AM   Result Value Ref Range    PRO-BNP 31,729.0 (H) <=300.0 pg/mL   POCT Glucose    Collection Time: 05/06/24 12:50 AM   Result Value Ref Range    Glucose, POC 309 (H) 70 - 179 mg/dL   O2 Saturation Venous    Collection Time: 05/06/24  1:17 AM   Result Value Ref Range    O2 Saturation, Venous 82.4 40.0 - 85.0 %   Hemoglobin and Hematocrit    Collection Time: 05/06/24  1:17 AM   Result Value Ref Range    HGB 10.1 (L) 11.3 - 14.9 g/dL    HCT 16.1 (L) 09.6 - 44.0 %   Platelet Count    Collection Time: 05/06/24  1:17 AM   Result Value Ref Range    Platelet 89 (L) 150 - 450 10*9/L   ECG 12 Lead    Collection Time: 05/06/24  4:19 AM   Result Value Ref Range    EKG Systolic BP  mmHg    EKG Diastolic BP  mmHg    EKG Ventricular Rate 89 BPM    EKG Atrial Rate 89 BPM    EKG P-R Interval 170 ms    EKG QRS Duration 102 ms    EKG Q-T Interval 392 ms    EKG QTC Calculation 476 ms    EKG Calculated P Axis 47 degrees    EKG Calculated R Axis 66 degrees    EKG Calculated T Axis 35 degrees    QTC Fredericia 446 ms   CBC w/ Differential    Collection Time: 05/06/24  4:26 AM   Result Value Ref Range    WBC 17.3 (H) 3.6 - 11.2 10*9/L    RBC 3.48 (L) 3.95 - 5.13 10*12/L    HGB 10.4 (L) 11.3 - 14.9 g/dL    HCT 04.5 (L) 40.9 - 44.0 %    MCV 86.8 77.6 - 95.7 fL    MCH 29.9 25.9 - 32.4 pg    MCHC 34.4 32.0 - 36.0 g/dL    RDW 81.1 (H) 91.4 - 15.2 %    MPV 8.0 6.8 - 10.7 fL    Platelet 99 (L) 150 - 450 10*9/L    Neutrophils % 76.1 %    Lymphocytes % 7.2 %    Monocytes % 16.4 %    Eosinophils % 0.1 %    Basophils % 0.2 %    Absolute Neutrophils 13.1 (H) 1.8 -  7.8 10*9/L    Absolute Lymphocytes 1.2 1.1 - 3.6 10*9/L    Absolute Monocytes 2.8 (H) 0.3 - 0.8 10*9/L    Absolute Eosinophils 0.0 0.0 - 0.5 10*9/L    Absolute Basophils 0.0 0.0 - 0.1 10*9/L    Anisocytosis Slight (A) Not Present   hsTroponin I (single, no delta)    Collection Time: 05/06/24  4:26 AM   Result Value Ref Range    hsTroponin I 14,591 (HH) <=34 ng/L   Blood Gas Critical Care Panel, Arterial    Collection Time: 05/06/24  4:26 AM   Result Value Ref Range    Specimen Source Arterial     FIO2 Arterial 70%     pH, Arterial 7.34 (L) 7.35 - 7.45    pCO2, Arterial 41.8 35.0 - 45.0 mm Hg    pO2, Arterial 146.0 (H) 80.0 - 110.0 mm Hg    HCO3 (Bicarbonate), Arterial 22 22 - 27 mmol/L    Base Excess, Arterial -2.6 (L) -2.0 - 2.0    O2 Sat, Arterial 99.8 94.0 - 100.0 %    Sodium Whole Blood 140 135 - 145 mmol/L    Potassium, Bld 5.2 (H) 3.4 - 4.6 mmol/L    Calcium , Ionized Arterial 5.21 4.40 - 5.40 mg/dL    Glucose Whole Blood 363 (H) 70 - 179 mg/dL    Lactate, Arterial 2.7 (H) <1.3 mmol/L    Hgb, blood gas 10.30 (L) 12.00 - 16.00 g/dL   POCT Glucose    Collection Time: 05/06/24  5:13 AM   Result Value Ref Range    Glucose, POC 340 (H) 70 - 179 mg/dL   Blood Gas, Arterial Specify Site: Arterial; FIO2 Arterial: 60%    Collection Time: 05/06/24  7:06 AM   Result Value Ref Range    Specimen Source Arterial     FIO2 Arterial 60%     pH, Arterial 7.35 7.35 - 7.45    pO2, Arterial 148.0 (H) 80.0 - 110.0 mm Hg    pCO2, Arterial 39.9 35.0 - 45.0 mm Hg    HCO3 (Bicarbonate), Arterial 21 (L) 22 - 27 mmol/L    Base Excess, Arterial -3.4 (L) -2.0 - 2.0    O2 Sat, Arterial 99.3 94.0 - 100.0 %   POCT Glucose    Collection Time: 05/06/24  7:06 AM   Result Value Ref Range    Glucose, POC 239 (H) 70 - 179 mg/dL       Assessment/Plan:    Dayton Sherr is a 58 y.o. female with CAD s/p NSTEMI (reportedly no stents), CKD, decompensated MASLD cirrhosis c/b  ascites, hepatic encephalopathy and EV s/p recent banding admitted with UGIB 2/2 bleeding esophageal ulcers s/p unsuccessful TIPS complicated by pulsatile VT s/p shocks requiring transfer to [facility] ICU.  In hemorrhagic shock secondary to gastric bleed on admission requiring MTP now s/p salvage TIPS on 6/4. MELD 24 from 22 preprocedure. Cardiology consulted for wide complex ventricular tachycardia and suspected stress cardiomyopathy.    - [facility] will sign off. Please contact [facility] pager at [phone] with questions or concerns.    Tyra Galley, MD  [facility] PGY-6

## 2024-05-06 NOTE — Unmapped (Signed)
 Pt unstable for SBT.  ETT patent and secure  Problem: Mechanical Ventilation Invasive  Goal: Mechanical Ventilation Liberation  Outcome: Ongoing - Unchanged  Goal: Absence of Device-Related Skin and Tissue Injury  Outcome: Ongoing - Unchanged  Intervention: Maintain Skin and Tissue Health  Flowsheets (Taken 05/06/2024 0552)  Device Skin Pressure Protection: absorbent pad utilized/changed  Goal: Absence of Ventilator-Induced Lung Injury  Outcome: Ongoing - Unchanged  Intervention: Facilitate Lung-Protection Measures  Flowsheets (Taken 05/06/2024 0552)  Lung Protection Measures: ventilator waveforms monitored  Intervention: Prevent Ventilator-Associated Pneumonia  Flowsheets  Taken 05/06/2024 0552  Head of Bed (HOB) Positioning: HOB at 30-45 degrees  VAP Prevention Bundle: readiness to extubate assessed  Taken 05/05/2024 2034  Oral Care: (unstable)   suction provided   other (see comments)     Problem: Adult Inpatient Plan of Care  Goal: Absence of Hospital-Acquired Illness or Injury  Intervention: Prevent Skin Injury  Recent Flowsheet Documentation  Taken 05/06/2024 0552 by Benna Brasher, RRT  Device Skin Pressure Protection: absorbent pad utilized/changed     Problem: Skin Injury Risk Increased  Goal: Skin Health and Integrity  Intervention: Optimize Skin Protection  Recent Flowsheet Documentation  Taken 05/06/2024 0552 by Benna Brasher, RRT  Head of Bed [facility]) Positioning: HOB at 30-45 degrees     Problem: Wound  Goal: Skin Health and Integrity  Intervention: Optimize Skin Protection  Recent Flowsheet Documentation  Taken 05/06/2024 0552 by Benna Brasher, RRT  Head of Bed [facility]) Positioning: HOB at 30-45 degrees     Problem: Mechanical Ventilation Invasive  Goal: Optimal Device Function  Intervention: Optimize Device Care and Function  Flowsheets  Taken 05/06/2024 1610  Airway/Ventilation Management: airway patency maintained  Airway Safety Measures: mask valve resuscitator at bedside  Taken 05/06/2024 0502  Airway/Ventilation Management: airway patency maintained  Taken 05/05/2024 2034  Oral Care: (unstable)   suction provided   other (see comments)     Problem: Artificial Airway  Goal: Optimal Device Function  Intervention: Optimize Device Care and Function  Recent Flowsheet Documentation  Taken 05/06/2024 9604 by Benna Brasher, RRT  Airway/Ventilation Management: airway patency maintained  Airway Safety Measures: mask valve resuscitator at bedside  Taken 05/06/2024 0502 by Benna Brasher, RRT  Airway/Ventilation Management: airway patency maintained  Taken 05/05/2024 2034 by Benna Brasher, RRT  Oral Care: (unstable)   suction provided   other (see comments)  Goal: Absence of Device-Related Skin or Tissue Injury  Intervention: Maintain Skin and Tissue Health  Recent Flowsheet Documentation  Taken 05/06/2024 0552 by Benna Brasher, RRT  Device Skin Pressure Protection: absorbent pad utilized/changed

## 2024-05-06 NOTE — Unmapped (Signed)
 Vancomycin  Therapeutic Monitoring Pharmacy Note    Belle Charlie is a 58 y.o. female starting vancomycin . Date of therapy initiation: 05/06/24    Indication: Suspected infection     Prior Dosing Information: S/p vancomycin  2000 mg x 1 (05/06/24 1800)      Goals:  Therapeutic Drug Levels  Vancomycin  trough goal: 10-15 mg/L    Additional Clinical Monitoring/Outcomes  Renal function, volume status (intake and output)    Results: Not applicable    Wt Readings from Last 1 Encounters:   05/04/24 88.8 kg (195 lb 12.3 oz)     Creatinine   Date Value Ref Range Status   05/06/2024 1.49 (H) 0.55 - 1.02 mg/dL Final   16/09/9603 5.40 (H) 0.55 - 1.02 mg/dL Final   98/10/9146 8.29 (H) 0.55 - 1.02 mg/dL Final        Pharmacokinetic Considerations and Significant Drug Interactions:  Per linear dose adjustment  Concurrent nephrotoxic meds: Zosyn     Assessment/Plan:  Recommendation(s)  S/p vancomycin  load (~ 22 mg/kg), will check a level prior to redosing iso AKI  Estimated trough on recommended regimen: Not applicable - dosing by level    Follow-up  Level due: tomorrow with AM labs  A pharmacist will continue to monitor and order levels as appropriate    Please page service pharmacist with questions/clarifications.    Antoniette Batty, PharmD

## 2024-05-06 NOTE — Unmapped (Signed)
 Adult Nutrition Assessment Note    Visit Type: MD Consult  Reason for Visit: Assessment (Nutrition), Enteral Nutrition    NUTRITION INTERVENTIONS and RECOMMENDATION     Holding off on tube feeds for today - norepi @ 18 + vaso   Tube feeds:   If norepi trending down/able to reduce to single pressor, start trickle feeds: Recommend Novasource Renal at goal rate 20 mL/hr. This provides 840 kcals, 40 g protein, 77 g carbohydrate, 43 g fat, 0 g fiber, 299 mL free water , and meets 42% USRDI.  Goal tube feeds (when norepi <10)  Recommend Novasource Renal at goal rate 30 mL/hr. This provides 1260 kcals, 59 g protein, 115 g carbohydrate, 64 g fat, 0 g fiber, 448 mL free water , and meets 63% USRDI.  Prosource TF 2 pkts BID   Total: 1500 kcal, 119 g protein  Propofol  currently providing only 101 kcal/day       NUTRITION ASSESSMENT     Patient appropriate for fiber-free formula given risk of hypoperfusion of the GI tract and bowel ischemia due to current pressor requirements.   Current vasopressor requirements increase risk for gut hypoperfusion and bowel ischemia. Trickle rate vs holding feeds may be warranted.  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     Nutrition History:   No family at bedside, pt intubated/sedated. Admitted 6/4, NPO for high pressor requirements since then - unable to start today as well due to ongoing high pressor requirements. Hoping for trickle tube feeds tomorrow.     Medications:  Nutritionally pertinent medications reviewed and evaluated for potential food and/or medication interactions.   IV albumin , IV lasix , sliding scale, NPH, lactulose , zosyn , rifaximin    Drips: amio, fent  Norepi trending up @ 18 + vaso     Labs:   Nutritionally pertinent labs reviewed.   K+ (5.4), Cr (1.32), BUN (56), glucose values (239-368 mg/dL)   proBNP 69G, elevated AST/ALT     Nutritional Needs:   Daily Estimated Nutrient Needs:  Energy: [phone] kcals 11-14 kcal/kg (per ASPEN/SCCM guidelines for the critically ill obese, BMI 30-50) using last recorded weight, 89 kg (05/06/24 1456)]  Protein: 110 gm [> 2 gm/kg (per ASPEN/SCCM guidelines for the critically ill obese, BMI 30-39.9) using ideal body weight, 55 kg (05/06/24 1456)]  Carbohydrate:   [45-60% of kcal]  Fluid:   mL [per MD team]    Compared to PSU 1633 (6/6; 37.2; 7.75)     Anthropometric Data:  Height: 162.6 cm (5' 4.02)   Admission weight: 88.8 kg (195 lb 12.3 oz)  Last recorded weight: 88.8 kg (195 lb 12.3 oz) (05/04/24)  IBW: 54.53 kg  BMI: Body mass index is 33.59 kg/m??.   Usual Body Weight: Unable to obtain at this time  - no family at bedside   Weight Assessment:   Trending down - not using for malnutrition dx in the setting of decomp cirrhosis     Wt Readings from Last 10 Encounters:   05/04/24 88.8 kg (195 lb 12.3 oz)   04/26/24 87.3 kg (192 lb 8 oz)   01/04/24 88.5 kg (195 lb)   12/17/23 88.5 kg (195 lb 1.6 oz)   12/07/23 89.4 kg (197 lb)   11/21/23 93.1 kg (205 lb 3.2 oz)  11/06/23 94.3 kg (207 lb 12.8 oz)   11/05/23 96.2 kg (212 lb)   11/02/23 96.2 kg (212 lb)   10/26/23 95.8 kg (211 lb 1.6 oz)     Malnutrition Assessment:  Malnutrition assessment not yet completed at this time due to lack of nutrition history and inability to complete nutrition focused physical exam (NFPE).     Nutrition Focused Physical Exam:  Unable to complete at this time due to patient inability to participate in exam     Care plan:  Ongoing, unable to diagnose malnutrition at this time    Current Nutrition:  NPO with NG tube in place   Nutrition Orders            None          Nutritionally Pertinent Allergies, Intolerances, Sensitivities, and/or Cultural/Religious Restrictions:  none identified per chart review at this time GOALS and EVALUATION     Patient to meet 60% or greater of nutritional needs via enteral nutrition within the first week of ICU admission. - New    Motivation, Barriers, and Compliance:  Evaluation of motivation, barriers, and compliance completed. No concerns identified at this time.     Discharge Planning:   Monitor for potential discharge needs with multi-disciplinary team.          Follow-Up Parameters:   1-2 times per week (and more frequent as indicated)    Graeme Lawn MPH, RD, CNSC, LDN   Pager: [phone]  Phone: [phone]  Cailean [email or web address]

## 2024-05-06 NOTE — Unmapped (Signed)
 Arterial Line Insertion Procedure Note     Date of Service: 05/06/2024    Patient Name:: Ashley Briggs  Patient MRN: [id number]    Indications: Hemodynamic monitoring    Consent:   I explained the potential benefits and risks of the procedure, including  temporary vascular occlusion, thrombosis, ischemia, hematoma formation, local/catheter-related infection and nerve/tissue damage. I explained potential alternatives. The patient/HCDM understands these risks, agrees to the procedure, and signed the informed consent form.    Procedure Details:   Time-out was performed immediately prior to the procedure to verify correct patient, procedure, site, positioning, and special equipment if applicable.    Leighton Punches???s test was performed to ensure adequate perfusion. The patient???s right wrist was prepped and draped in sterile fashion. 1% Lidocaine  was used to anesthetize the area. A 18 G Arrow line was introduced into the ulnar artery with ultrasound guidance. The catheter was threaded over the guide wire and the needle was removed with appropriate pulsatile blood return. The catheter was then sutured in place to the skin and a sterile CHG dressing applied. Perfusion to the extremity distal to the point of catheter insertion was checked and found to be adequate.  A pressure transducer was connected sterilely to the arterial line and an arterial line waveform was noted on the monitor.     Estimated Blood Loss: 5 ml    Condition:  The patient tolerated the procedure well and remains in the same condition as pre-procedure.    Complications:  The patient tolerated the procedure well and there were no complications.    Plan:  Continue pressors to MAP > 65    Macarthur Savory, MD

## 2024-05-06 NOTE — Anesthesia Postprocedure Evaluation (Signed)
 Anesthesia Post Note  Patient: Abigail Molina  Procedure(s) Performed: IR TIPS  Patient location during evaluation: SICU Anesthesia Type: General Vital Signs Assessment: post-procedure vital signs reviewed and stable Respiratory status: patient remains intubated per anesthesia plan Cardiovascular status: stable Anesthetic complications: no Comments: Patient was discharged and transferred to [facility] for TIPS procedure, so she was not able to be seen by the anesthesia post-op team, but her vital signs appeared to be stable prior to transfer.   No notable events documented.   Last Vitals:  Vitals:   05/04/24 0415 05/04/24 0437  BP:  (!) 112/51  Pulse: (!) 139 (!) 140  Resp: (!) 23 (!) 22  Temp:  37.2 C  SpO2: 91% 93%    Last Pain:  Vitals:   05/03/24 2330  TempSrc: Axillary  PainSc:                  Vanice Genre

## 2024-05-07 LAB — CBC W/ AUTO DIFF
BASOPHILS ABSOLUTE COUNT: 0 10*9/L (ref 0.0–0.1)
BASOPHILS ABSOLUTE COUNT: 0 10*9/L (ref 0.0–0.1)
BASOPHILS ABSOLUTE COUNT: 0 10*9/L (ref 0.0–0.1)
BASOPHILS RELATIVE PERCENT: 0.3 %
BASOPHILS RELATIVE PERCENT: 0.1 %
BASOPHILS RELATIVE PERCENT: 0.3 %
EOSINOPHILS ABSOLUTE COUNT: 0.1 10*9/L (ref 0.0–0.5)
EOSINOPHILS ABSOLUTE COUNT: 0 10*9/L (ref 0.0–0.5)
EOSINOPHILS ABSOLUTE COUNT: 0 10*9/L (ref 0.0–0.5)
EOSINOPHILS RELATIVE PERCENT: 0.7 %
EOSINOPHILS RELATIVE PERCENT: 0 %
EOSINOPHILS RELATIVE PERCENT: 0 %
HEMATOCRIT: 28.7 % — ABNORMAL LOW (ref 34.0–44.0)
HEMATOCRIT: 27.8 % — ABNORMAL LOW (ref 34.0–44.0)
HEMATOCRIT: 28.5 % — ABNORMAL LOW (ref 34.0–44.0)
HEMOGLOBIN: 9.9 g/dL — ABNORMAL LOW (ref 11.3–14.9)
HEMOGLOBIN: 9.6 g/dL — ABNORMAL LOW (ref 11.3–14.9)
HEMOGLOBIN: 9.8 g/dL — ABNORMAL LOW (ref 11.3–14.9)
LYMPHOCYTES ABSOLUTE COUNT: 0.7 10*9/L — ABNORMAL LOW (ref 1.1–3.6)
LYMPHOCYTES ABSOLUTE COUNT: 0.5 10*9/L — ABNORMAL LOW (ref 1.1–3.6)
LYMPHOCYTES ABSOLUTE COUNT: 0.5 10*9/L — ABNORMAL LOW (ref 1.1–3.6)
LYMPHOCYTES RELATIVE PERCENT: 7.1 %
LYMPHOCYTES RELATIVE PERCENT: 7 %
LYMPHOCYTES RELATIVE PERCENT: 6 %
MEAN CORPUSCULAR HEMOGLOBIN CONC: 34.6 g/dL (ref 32.0–36.0)
MEAN CORPUSCULAR HEMOGLOBIN CONC: 34.7 g/dL (ref 32.0–36.0)
MEAN CORPUSCULAR HEMOGLOBIN CONC: 34.2 g/dL (ref 32.0–36.0)
MEAN CORPUSCULAR HEMOGLOBIN: 30.1 pg (ref 25.9–32.4)
MEAN CORPUSCULAR HEMOGLOBIN: 30.1 pg (ref 25.9–32.4)
MEAN CORPUSCULAR HEMOGLOBIN: 30.1 pg (ref 25.9–32.4)
MEAN CORPUSCULAR VOLUME: 87 fL (ref 77.6–95.7)
MEAN CORPUSCULAR VOLUME: 86.8 fL (ref 77.6–95.7)
MEAN CORPUSCULAR VOLUME: 87.9 fL (ref 77.6–95.7)
MEAN PLATELET VOLUME: 7.5 fL (ref 6.8–10.7)
MEAN PLATELET VOLUME: 7.3 fL (ref 6.8–10.7)
MEAN PLATELET VOLUME: 7.9 fL (ref 6.8–10.7)
MONOCYTES ABSOLUTE COUNT: 1.1 10*9/L — ABNORMAL HIGH (ref 0.3–0.8)
MONOCYTES ABSOLUTE COUNT: 0.5 10*9/L (ref 0.3–0.8)
MONOCYTES ABSOLUTE COUNT: 0.8 10*9/L (ref 0.3–0.8)
MONOCYTES RELATIVE PERCENT: 11.3 %
MONOCYTES RELATIVE PERCENT: 7 %
MONOCYTES RELATIVE PERCENT: 9.2 %
NEUTROPHILS ABSOLUTE COUNT: 8 10*9/L — ABNORMAL HIGH (ref 1.8–7.8)
NEUTROPHILS ABSOLUTE COUNT: 6.6 10*9/L (ref 1.8–7.8)
NEUTROPHILS ABSOLUTE COUNT: 7.1 10*9/L (ref 1.8–7.8)
NEUTROPHILS RELATIVE PERCENT: 80.6 %
NEUTROPHILS RELATIVE PERCENT: 85.9 %
NEUTROPHILS RELATIVE PERCENT: 84.5 %
PLATELET COUNT: 83 10*9/L — ABNORMAL LOW (ref 150–450)
PLATELET COUNT: 72 10*9/L — ABNORMAL LOW (ref 150–450)
PLATELET COUNT: 99 10*9/L — ABNORMAL LOW (ref 150–450)
RED BLOOD CELL COUNT: 3.3 10*12/L — ABNORMAL LOW (ref 3.95–5.13)
RED BLOOD CELL COUNT: 3.2 10*12/L — ABNORMAL LOW (ref 3.95–5.13)
RED BLOOD CELL COUNT: 3.24 10*12/L — ABNORMAL LOW (ref 3.95–5.13)
RED CELL DISTRIBUTION WIDTH: 17.3 % — ABNORMAL HIGH (ref 12.2–15.2)
RED CELL DISTRIBUTION WIDTH: 17.5 % — ABNORMAL HIGH (ref 12.2–15.2)
RED CELL DISTRIBUTION WIDTH: 17.5 % — ABNORMAL HIGH (ref 12.2–15.2)
WBC ADJUSTED: 10 10*9/L (ref 3.6–11.2)
WBC ADJUSTED: 7.7 10*9/L (ref 3.6–11.2)
WBC ADJUSTED: 8.4 10*9/L (ref 3.6–11.2)

## 2024-05-07 LAB — GLUCOSE, BODY FLUID
GLUCOSE FLUID: 418 mg/dL
GLUCOSE FLUID: 415 mg/dL

## 2024-05-07 LAB — COMPREHENSIVE METABOLIC PANEL
ALBUMIN: 3.4 g/dL (ref 3.4–5.0)
ALBUMIN: 3.7 g/dL (ref 3.4–5.0)
ALBUMIN: 3.8 g/dL (ref 3.4–5.0)
ALKALINE PHOSPHATASE: 87 U/L (ref 46–116)
ALKALINE PHOSPHATASE: 79 U/L (ref 46–116)
ALKALINE PHOSPHATASE: 88 U/L (ref 46–116)
ALT (SGPT): 390 U/L — ABNORMAL HIGH (ref 10–49)
ALT (SGPT): 424 U/L — ABNORMAL HIGH (ref 10–49)
ALT (SGPT): 524 U/L — ABNORMAL HIGH (ref 10–49)
ANION GAP: 13 mmol/L (ref 5–14)
ANION GAP: 13 mmol/L (ref 5–14)
ANION GAP: 14 mmol/L (ref 5–14)
AST (SGOT): 986 U/L — ABNORMAL HIGH (ref <=34)
AST (SGOT): 1037 U/L — ABNORMAL HIGH (ref <=34)
AST (SGOT): 1294 U/L — ABNORMAL HIGH (ref <=34)
BILIRUBIN TOTAL: 11.3 mg/dL — ABNORMAL HIGH (ref 0.3–1.2)
BILIRUBIN TOTAL: 11.7 mg/dL — ABNORMAL HIGH (ref 0.3–1.2)
BILIRUBIN TOTAL: 13.1 mg/dL — ABNORMAL HIGH (ref 0.3–1.2)
BLOOD UREA NITROGEN: 68 mg/dL — ABNORMAL HIGH (ref 9–23)
BLOOD UREA NITROGEN: 71 mg/dL — ABNORMAL HIGH (ref 9–23)
BLOOD UREA NITROGEN: 79 mg/dL — ABNORMAL HIGH (ref 9–23)
BUN / CREAT RATIO: 41
BUN / CREAT RATIO: 46
BUN / CREAT RATIO: 47
CALCIUM: 9.4 mg/dL (ref 8.7–10.4)
CALCIUM: 9.3 mg/dL (ref 8.7–10.4)
CALCIUM: 9.5 mg/dL (ref 8.7–10.4)
CHLORIDE: 105 mmol/L (ref 98–107)
CHLORIDE: 106 mmol/L (ref 98–107)
CHLORIDE: 104 mmol/L (ref 98–107)
CO2: 23 mmol/L (ref 20.0–31.0)
CO2: 21 mmol/L (ref 20.0–31.0)
CO2: 23 mmol/L (ref 20.0–31.0)
CREATININE: 1.64 mg/dL — ABNORMAL HIGH (ref 0.55–1.02)
CREATININE: 1.55 mg/dL — ABNORMAL HIGH (ref 0.55–1.02)
CREATININE: 1.69 mg/dL — ABNORMAL HIGH (ref 0.55–1.02)
EGFR CKD-EPI (2021) FEMALE: 36 mL/min/1.73m2 — ABNORMAL LOW (ref >=60)
EGFR CKD-EPI (2021) FEMALE: 38 mL/min/1.73m2 — ABNORMAL LOW (ref >=60)
EGFR CKD-EPI (2021) FEMALE: 35 mL/min/1.73m2 — ABNORMAL LOW (ref >=60)
GLUCOSE RANDOM: 351 mg/dL — ABNORMAL HIGH (ref 70–179)
GLUCOSE RANDOM: 389 mg/dL — ABNORMAL HIGH (ref 70–99)
GLUCOSE RANDOM: 438 mg/dL (ref 70–179)
POTASSIUM: 4.6 mmol/L (ref 3.4–4.8)
POTASSIUM: 4.6 mmol/L (ref 3.4–4.8)
POTASSIUM: 4.9 mmol/L — ABNORMAL HIGH (ref 3.4–4.8)
PROTEIN TOTAL: 5.7 g/dL (ref 5.7–8.2)
PROTEIN TOTAL: 5.8 g/dL (ref 5.7–8.2)
PROTEIN TOTAL: 6.3 g/dL (ref 5.7–8.2)
SODIUM: 141 mmol/L (ref 135–145)
SODIUM: 140 mmol/L (ref 135–145)
SODIUM: 141 mmol/L (ref 135–145)

## 2024-05-07 LAB — HIGH SENSITIVITY TROPONIN I - SINGLE: HIGH SENSITIVITY TROPONIN I: 18449 ng/L (ref <=34)

## 2024-05-07 LAB — PROTIME-INR
INR: 1.61
INR: 1.49
PROTIME: 18.4 s — ABNORMAL HIGH (ref 9.9–12.6)
PROTIME: 17 s — ABNORMAL HIGH (ref 9.9–12.6)

## 2024-05-07 LAB — VANCOMYCIN, RANDOM: VANCOMYCIN RANDOM: 19.4 ug/mL

## 2024-05-07 LAB — LIPASE: LIPASE: 48 U/L (ref 12–53)

## 2024-05-07 LAB — MAGNESIUM
MAGNESIUM: 2.4 mg/dL (ref 1.6–2.6)
MAGNESIUM: 2.6 mg/dL (ref 1.6–2.6)
MAGNESIUM: 2.5 mg/dL (ref 1.6–2.6)

## 2024-05-07 LAB — CRYPTOCOCCAL ANTIGEN, SERUM: CRYPTOCOCCAL ANTIGEN: NEGATIVE

## 2024-05-07 LAB — FIBRINOGEN: FIBRINOGEN LEVEL: 119 mg/dL — ABNORMAL LOW (ref 175–500)

## 2024-05-07 MED ADMIN — hydrocortisone sod succ (Solu-CORTEF) injection 50 mg: 50 mg | INTRAVENOUS | @ 22:00:00

## 2024-05-07 MED ADMIN — hydrocortisone sod succ (Solu-CORTEF) injection 50 mg: 50 mg | INTRAVENOUS | @ 08:00:00

## 2024-05-07 MED ADMIN — sodium chloride (NS) 0.9 % flush 10 mL: 10 mL | INTRAVENOUS | @ 08:00:00

## 2024-05-07 MED ADMIN — fentaNYL PF (SUBLIMAZE) (50 mcg/mL) infusion (bag): 0-200 ug/h | INTRAVENOUS | @ 02:00:00 | Stop: 2024-05-19

## 2024-05-07 MED ADMIN — piperacillin-tazobactam (ZOSYN) IVPB (premix) 4.5 g: 4.5 g | INTRAVENOUS | @ 11:00:00 | Stop: 2024-05-11

## 2024-05-07 MED ADMIN — sodium chloride (NS) 0.9 % flush 10 mL: 10 mL | INTRAVENOUS | @ 15:00:00

## 2024-05-07 MED ADMIN — amiodarone 360 mg in dextrose 5% 200 mL (1.8 mg/mL) infusion PMB: .5 mg/min | INTRAVENOUS | @ 07:00:00

## 2024-05-07 MED ADMIN — insulin NPH (HumuLIN,NovoLIN) injection 24 Units: 24 [IU] | SUBCUTANEOUS | @ 23:00:00

## 2024-05-07 MED ADMIN — NORepinephrine 8 mg in dextrose 5 % 250 mL (32 mcg/mL) infusion PMB: 0-40 ug/min | INTRAVENOUS

## 2024-05-07 MED ADMIN — propofol (DIPRIVAN) infusion 10 mg/mL: 0-30 ug/kg/min | INTRAVENOUS | @ 06:00:00

## 2024-05-07 MED ADMIN — piperacillin-tazobactam (ZOSYN) IVPB (premix) 4.5 g: 4.5 g | INTRAVENOUS | @ 17:00:00 | Stop: 2024-05-11

## 2024-05-07 MED ADMIN — fentaNYL (PF) (SUBLIMAZE) injection 50 mcg: 50 ug | INTRAVENOUS | @ 17:00:00 | Stop: 2024-05-18

## 2024-05-07 MED ADMIN — rifAXIMin (XIFAXAN) oral suspension: 550 mg | GASTROENTERAL | @ 01:00:00 | Stop: 2024-05-10

## 2024-05-07 MED ADMIN — hydrocortisone sod succ (Solu-CORTEF) injection 50 mg: 50 mg | INTRAVENOUS | @ 02:00:00

## 2024-05-07 MED ADMIN — albumin human 25 % 50 g: 50 g | INTRAVENOUS | @ 10:00:00 | Stop: 2024-05-07

## 2024-05-07 MED ADMIN — NORepinephrine 8 mg in dextrose 5 % 250 mL (32 mcg/mL) infusion PMB: 0-40 ug/min | INTRAVENOUS | @ 10:00:00

## 2024-05-07 MED ADMIN — levETIRAcetam (KEPPRA) injection 750 mg: 750 mg | INTRAVENOUS | @ 12:00:00

## 2024-05-07 MED ADMIN — pantoprazole (Protonix) injection 40 mg: 40 mg | INTRAVENOUS | @ 01:00:00

## 2024-05-07 MED ADMIN — rifAXIMin (XIFAXAN) oral suspension: 550 mg | GASTROENTERAL | @ 12:00:00 | Stop: 2024-05-10

## 2024-05-07 MED ADMIN — piperacillin-tazobactam (ZOSYN) IVPB (premix) 4.5 g: 4.5 g | INTRAVENOUS | @ 02:00:00 | Stop: 2024-05-11

## 2024-05-07 MED ADMIN — fentaNYL (PF) (SUBLIMAZE) injection 100 mcg: 100 ug | INTRAVENOUS | @ 19:00:00 | Stop: 2024-05-18

## 2024-05-07 MED ADMIN — levETIRAcetam (KEPPRA) injection 750 mg: 750 mg | INTRAVENOUS | @ 01:00:00

## 2024-05-07 MED ADMIN — vasopressin 20 units in 100 mL (0.2 units/mL) infusion premade vial: .03 [IU]/min | INTRAVENOUS | @ 14:00:00

## 2024-05-07 MED ADMIN — insulin lispro (HumaLOG) injection 0-20 Units: 0-20 [IU] | SUBCUTANEOUS | @ 01:00:00 | Stop: 2024-05-06

## 2024-05-07 MED ADMIN — propofol (DIPRIVAN) infusion 10 mg/mL: 0-30 ug/kg/min | INTRAVENOUS | @ 22:00:00

## 2024-05-07 MED ADMIN — NORepinephrine 8 mg in dextrose 5 % 250 mL (32 mcg/mL) infusion PMB: 0-40 ug/min | INTRAVENOUS | @ 06:00:00 | Stop: 2024-05-07

## 2024-05-07 MED ADMIN — insulin lispro (HumaLOG) injection 0-20 Units: 0-20 [IU] | SUBCUTANEOUS | @ 10:00:00

## 2024-05-07 MED ADMIN — insulin lispro (HumaLOG) injection 0-20 Units: 0-20 [IU] | SUBCUTANEOUS | @ 23:00:00

## 2024-05-07 MED ADMIN — insulin lispro (HumaLOG) injection 0-20 Units: 0-20 [IU] | SUBCUTANEOUS | @ 17:00:00

## 2024-05-07 MED ADMIN — hydrocortisone sod succ (Solu-CORTEF) injection 50 mg: 50 mg | INTRAVENOUS | @ 14:00:00

## 2024-05-07 MED ADMIN — vasopressin 20 units in 100 mL (0.2 units/mL) infusion premade vial: .03 [IU]/min | INTRAVENOUS

## 2024-05-07 MED ADMIN — propofol (DIPRIVAN) infusion 10 mg/mL: 0-30 ug/kg/min | INTRAVENOUS | @ 15:00:00

## 2024-05-07 MED ADMIN — insulin NPH (HumuLIN,NovoLIN) injection 4 Units: 4 [IU] | SUBCUTANEOUS | @ 14:00:00 | Stop: 2024-05-07

## 2024-05-07 MED ADMIN — lactulose oral solution: 20 g | GASTROENTERAL | @ 22:00:00

## 2024-05-07 MED ADMIN — pantoprazole (Protonix) injection 40 mg: 40 mg | INTRAVENOUS | @ 12:00:00

## 2024-05-07 MED ADMIN — vasopressin 20 units in 100 mL (0.2 units/mL) infusion premade vial: .03 [IU]/min | INTRAVENOUS | @ 04:00:00

## 2024-05-07 MED ADMIN — amiodarone 360 mg in dextrose 5% 200 mL (1.8 mg/mL) infusion PMB: .5 mg/min | INTRAVENOUS | @ 18:00:00

## 2024-05-07 MED ADMIN — vancomycin (VANCOCIN) IVPB 1000 mg (premix): 1000 mg | INTRAVENOUS | @ 18:00:00 | Stop: 2024-05-07

## 2024-05-07 MED ADMIN — insulin NPH (HumuLIN,NovoLIN) injection 20 Units: 20 [IU] | SUBCUTANEOUS | @ 10:00:00 | Stop: 2024-05-07

## 2024-05-07 MED ADMIN — lactulose oral solution: 20 g | GASTROENTERAL | @ 15:00:00

## 2024-05-07 NOTE — Unmapped (Signed)
 Thoracentesis Procedure Note       PRE-PROCEDURE    Date of Service: 05/07/2024    Patient Name:: Ashley Briggs  Patient MRN: [id number]    Requesting Service: Medical ICU (MDI)    Indications:  pleural effusion on the left    Known Bleeding Diathesis: coagulopathy of cirrhosis, plts 80, INR 1.6    Antiplatelet Agents: This patient is not on an antiplatelet agent.    Systemic Anticoagulation: This patient is not on full systemic anticoagulation.    Significant Labs:  INR   Date Value Ref Range Status   05/07/2024 1.61  Final   01/31/2011 1.1  Final     PT   Date Value Ref Range Status   05/07/2024 18.4 (H) 9.9 - 12.6 sec Final   01/31/2011 12.2 9.7 - 12.6 SECONDS Final     APTT   Date Value Ref Range Status   05/04/2024 39.8 (H) 24.8 - 38.4 sec Final   01/31/2011 30.7 24.1 - 32.5 SECONDS Final     Platelet   Date Value Ref Range Status   05/07/2024 72 (L) 150 - 450 10*9/L Final   01/31/2011 256 150 - 440 x10 9th/L Final       Consent:   I explained the potential benefits and risks of the procedure, including  pneumothorax, cardiac complications, hematoma formation, local infection, nerve/tissue damage and sepsis. I explained potential alternatives. The patient/HCDM understands these risks, agrees to the procedure, and signed the informed consent form. Consent obtained from [facility], HCDM.     PERI-PROCEDURE    Time-out was performed immediately prior to the procedure to verify correct patient, procedure, site, positioning, and special equipment if applicable.    The patient was placed in with [facility] at 30 degrees w patient supine position. Using ultrasound guidance a Small,hypoechoic, simple pleural effusion on the left was noted. The area was prepped with chlorhexadine and draped in the usual sterile fashion.  25G needle used to access pleural space, 20cc of red fluid aspirated and sent for cell count, cultures, hematocrit.    POST-PROCEDURE    Findings:   20 mL of serosanguinous pleural fluid was removed.    Estimated Blood Loss: minimal ml    Condition:  The patient tolerated the procedure well and remains in the same condition as pre-procedure.    Complications: none, post thora US  without pneumo, similar appearing amount of pleural fluid     Plan:  Followup studies      Ilean Mall, MD

## 2024-05-07 NOTE — Unmapped (Signed)
 RASS -4. Withdraws to pain. Pupils equal and reactive. Sats >90% on 35%. Weaned from 40%. HR in NSR. Afebrile. Levo between 14-17 to maintain MAPS >70. UOP diminished. No BM this shift. Paracentesis and thoracentesis performed. NG to LIWS. Increased output from this AM. Brown/reddish output. Standard precautions maintained. No falls/injuries this shift. See MAR/Flowsheets for more info.          Problem: Adult Inpatient Plan of Care  Goal: Plan of Care Review  Outcome: Ongoing - Unchanged  Goal: Patient-Specific Goal (Individualized)  Outcome: Ongoing - Unchanged  Goal: Absence of Hospital-Acquired Illness or Injury  Outcome: Ongoing - Unchanged  Intervention: Identify and Manage Fall Risk  Recent Flowsheet Documentation  Taken 05/07/2024 0800 by Marianne Shirts, RN  Safety Interventions:   aspiration precautions   bed alarm   bleeding precautions   fall reduction program maintained   family at bedside   lighting adjusted for tasks/safety   low bed   muscle strengthening facilitated  Intervention: Prevent Skin Injury  Recent Flowsheet Documentation  Taken 05/07/2024 1800 by Marianne Shirts, RN  Positioning for Skin: Left  Taken 05/07/2024 1600 by Marianne Shirts, RN  Positioning for Skin: Right  Taken 05/07/2024 1400 by Marianne Shirts, RN  Positioning for Skin: Left  Taken 05/07/2024 1200 by Marianne Shirts, RN  Positioning for Skin: Right  Taken 05/07/2024 1000 by Marianne Shirts, RN  Positioning for Skin: Left  Taken 05/07/2024 0800 by Marianne Shirts, RN  Positioning for Skin: Right  Goal: Optimal Comfort and Wellbeing  Outcome: Ongoing - Unchanged  Goal: Readiness for Transition of Care  Outcome: Ongoing - Unchanged  Goal: Rounds/Family Conference  Outcome: Ongoing - Unchanged     Problem: Skin Injury Risk Increased  Goal: Skin Health and Integrity  Outcome: Ongoing - Unchanged  Intervention: Optimize Skin Protection  Recent Flowsheet Documentation  Taken 05/07/2024 1800 by Marianne Shirts, RN  Pressure Reduction Techniques: heels elevated off bed  Head of Bed (HOB) Positioning: HOB at 30-45 degrees  Pressure Reduction Devices: heel offloading device utilized  Taken 05/07/2024 1600 by Marianne Shirts, RN  Pressure Reduction Techniques: heels elevated off bed  Pressure Reduction Devices: heel offloading device utilized  Taken 05/07/2024 1400 by Marianne Shirts, RN  Pressure Reduction Techniques: heels elevated off bed  Head of Bed (HOB) Positioning: HOB at 30-45 degrees  Pressure Reduction Devices: heel offloading device utilized  Taken 05/07/2024 1200 by Marianne Shirts, RN  Pressure Reduction Techniques: heels elevated off bed  Head of Bed (HOB) Positioning: HOB at 30-45 degrees  Pressure Reduction Devices: heel offloading device utilized  Taken 05/07/2024 1000 by Marianne Shirts, RN  Pressure Reduction Techniques: heels elevated off bed  Head of Bed (HOB) Positioning: HOB at 30-45 degrees  Pressure Reduction Devices: heel offloading device utilized  Taken 05/07/2024 0800 by Marianne Shirts, RN  Pressure Reduction Techniques: heels elevated off bed  Head of Bed (HOB) Positioning: HOB at 30-45 degrees  Pressure Reduction Devices: heel offloading device utilized     Problem: Fall Injury Risk  Goal: Absence of Fall and Fall-Related Injury  Outcome: Ongoing - Unchanged  Intervention: Promote Scientist, clinical (histocompatibility and immunogenetics) Documentation  Taken 05/07/2024 0800 by Marianne Shirts, RN  Safety Interventions:   aspiration precautions   bed alarm   bleeding precautions   fall reduction program maintained   family at bedside   lighting adjusted for tasks/safety   low bed  muscle strengthening facilitated     Problem: Non-Violent Restraints  Goal: Patient will remain free of restraint events  Outcome: Ongoing - Unchanged  Goal: Patient will remain free of physical injury  Outcome: Ongoing - Unchanged     Problem: Wound  Goal: Optimal Coping  Outcome: Ongoing - Unchanged  Goal: Optimal Functional Ability  Outcome: Ongoing - Unchanged  Goal: Absence of Infection Signs and Symptoms  Outcome: Ongoing - Unchanged  Goal: Improved Oral Intake  Outcome: Ongoing - Unchanged  Goal: Optimal Pain Control and Function  Outcome: Ongoing - Unchanged  Goal: Skin Health and Integrity  Outcome: Ongoing - Unchanged  Intervention: Optimize Skin Protection  Recent Flowsheet Documentation  Taken 05/07/2024 1800 by Marianne Shirts, RN  Pressure Reduction Techniques: heels elevated off bed  Head of Bed (HOB) Positioning: HOB at 30-45 degrees  Pressure Reduction Devices: heel offloading device utilized  Taken 05/07/2024 1600 by Marianne Shirts, RN  Pressure Reduction Techniques: heels elevated off bed  Pressure Reduction Devices: heel offloading device utilized  Taken 05/07/2024 1400 by Marianne Shirts, RN  Pressure Reduction Techniques: heels elevated off bed  Head of Bed (HOB) Positioning: HOB at 30-45 degrees  Pressure Reduction Devices: heel offloading device utilized  Taken 05/07/2024 1200 by Marianne Shirts, RN  Pressure Reduction Techniques: heels elevated off bed  Head of Bed (HOB) Positioning: HOB at 30-45 degrees  Pressure Reduction Devices: heel offloading device utilized  Taken 05/07/2024 1000 by Marianne Shirts, RN  Pressure Reduction Techniques: heels elevated off bed  Head of Bed (HOB) Positioning: HOB at 30-45 degrees  Pressure Reduction Devices: heel offloading device utilized  Taken 05/07/2024 0800 by Marianne Shirts, RN  Pressure Reduction Techniques: heels elevated off bed  Head of Bed (HOB) Positioning: HOB at 30-45 degrees  Pressure Reduction Devices: heel offloading device utilized  Goal: Optimal Wound Healing  Outcome: Ongoing - Unchanged     Problem: Self-Care Deficit  Goal: Improved Ability to Complete Activities of Daily Living  Outcome: Ongoing - Unchanged     Problem: Mechanical Ventilation Invasive  Goal: Effective Communication  Outcome: Ongoing - Unchanged  Goal: Optimal Device Function  Outcome: Ongoing - Unchanged  Intervention: Optimize Device Care and Function  Recent Flowsheet Documentation  Taken 05/07/2024 1600 by Marianne Shirts, RN  Oral Care:   lip/mouth moisturizer applied   mouth swabbed   teeth brushed   suction provided  Taken 05/07/2024 1200 by Marianne Shirts, RN  Oral Care:   lip/mouth moisturizer applied   mouth swabbed   teeth brushed   suction provided  Goal: Mechanical Ventilation Liberation  Outcome: Ongoing - Unchanged  Goal: Optimal Nutrition Delivery  Outcome: Ongoing - Unchanged  Goal: Absence of Device-Related Skin and Tissue Injury  Outcome: Ongoing - Unchanged  Goal: Absence of Ventilator-Induced Lung Injury  Outcome: Ongoing - Unchanged  Intervention: Prevent Ventilator-Associated Pneumonia  Recent Flowsheet Documentation  Taken 05/07/2024 1800 by Marianne Shirts, RN  Head of Bed [facility]) Positioning: HOB at 30-45 degrees  Taken 05/07/2024 1600 by Marianne Shirts, RN  Oral Care:   lip/mouth moisturizer applied   mouth swabbed   teeth brushed   suction provided  Taken 05/07/2024 1400 by Marianne Shirts, RN  Head of Bed [facility]) Positioning: HOB at 30-45 degrees  Taken 05/07/2024 1200 by Marianne Shirts, RN  Head of Bed [facility]) Positioning: HOB at 30-45 degrees  Oral Care:   lip/mouth moisturizer applied   mouth swabbed  teeth brushed   suction provided  Taken 05/07/2024 1000 by Marianne Shirts, RN  Head of Bed [facility]) Positioning: HOB at 30-45 degrees  Taken 05/07/2024 0800 by Marianne Shirts, RN  Head of Bed [facility]) Positioning: HOB at 30-45 degrees     Problem: Artificial Airway  Goal: Effective Communication  Outcome: Ongoing - Unchanged  Goal: Optimal Device Function  Outcome: Ongoing - Unchanged  Intervention: Optimize Device Care and Function  Recent Flowsheet Documentation  Taken 05/07/2024 1600 by Marianne Shirts, RN  Oral Care:   lip/mouth moisturizer applied   mouth swabbed   teeth brushed   suction provided  Taken 05/07/2024 1200 by Marianne Shirts, RN  Oral Care:   lip/mouth moisturizer applied   mouth swabbed   teeth brushed   suction provided  Taken 05/07/2024 0800 by Marianne Shirts, RN  Aspiration Precautions: awake/alert before oral intake  Goal: Absence of Device-Related Skin or Tissue Injury  Outcome: Ongoing - Unchanged

## 2024-05-07 NOTE — Unmapped (Signed)
 Pt is on two pressors and sedated, unable to perform SBT.

## 2024-05-07 NOTE — Unmapped (Signed)
 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 The patient is seen in consultation at the request of Subhashini Brion Cancel, MD (Medical ICU ([facility])) for decompensated cirrhosis with variceal bleeding.    Patient with significant UGIB likely 2/2 gastric varix that was unable to be controlled endoscopically. Patient underwent successful salvage TIPS and has stopped bleeding. Unfortunately, she has been re-intubated (likely iso worsening HE) and is showing signs of worsening liver and kidney function related to ischemic injury during GI bleed. We expect this will continue to decline and may need to evaluate her for transplant. A significant barrier to transplant at this time is her cardiac status.     Recommendations:  Dx  - Daily MELD labs  - Follow up infectious studies    Tx  - Cont broad spectrum abx  - Continue lactulose  for 3-5 bowel movements daily and rifaximin    - Would limit future albumin  administration as serum albumin  is normal      MELD 3.0: 28 at 05/07/2024  2:25 AM  MELD-Na: 25 at 05/07/2024  2:25 AM  Calculated from:  Serum Creatinine: 1.64 mg/dL at 12/06/1094  0:45 AM  Serum Sodium: 141 mmol/L (Using max of 137 mmol/L) at 05/07/2024  2:25 AM  Total Bilirubin: 11.3 mg/dL at 4/0/9811  9:14 AM  Serum Albumin : 3.4 g/dL at 06/08/2955  2:13 AM  INR(ratio): 1.61 at 05/07/2024  2:25 AM  Age at listing (hypothetical): 57 years  Sex: Female at 05/07/2024  2:25 AM      Issues Impacting Complexity of Management:  -The patient has the need for intensive monitoring parameter(s) due to high-risk of clinical decline: q4h or more frequent monitoring of hemoglobins to monitor for stability of GI bleeding and frequent monitoring of MELD score to monitor for progression to/progression of acute or acute on chronic liver failure    Recommendations discussed with the patient's primary team. We will continue to follow along with you.    Subjective:   - Intubated, on levo/vaso  - Hgb stable  - Lfts, Cr, UOP worsening      Objective:   Temp:  [37.5 ??C (99.5 ??F)-38.4 ??C (101.1 ??F)] 37.7 ??C (99.9 ??F)  Pulse:  [82-103] 86  SpO2 Pulse:  [81-100] 87  Resp:  [13-25] 25  FiO2 (%):  [35 %-40 %] 35 %  SpO2:  [91 %-98 %] 96 %    Gen: Ill appearing  HEENT: intubated   Abdomen:  soft, moderately distended     Pertinent Labs & Studies:  -I have reviewed the patient's labs from 05/07/24 which show stable Hgb, worsening LFTs, and stable INR

## 2024-05-07 NOTE — Unmapped (Signed)
 Vancomycin  Therapeutic Monitoring Pharmacy Note    Ashley Briggs is a 58 y.o. female continuing vancomycin . Date of therapy initiation: 05/06/24    Indication: Suspected infection     Prior Dosing Information: S/p vancomycin  2000 mg x 1 (05/06/24 1800)      Goals:  Therapeutic Drug Levels  Vancomycin  trough goal: 10-15 mg/L    Additional Clinical Monitoring/Outcomes  Renal function, volume status (intake and output)    Results: Vancomycin  level 19.1 mg/L, drawn appropriately (extrapolated trough @ 1400 - ~12)    Wt Readings from Last 1 Encounters:   05/04/24 88.8 kg (195 lb 12.3 oz)     Creatinine   Date Value Ref Range Status   05/07/2024 1.55 (H) 0.55 - 1.02 mg/dL Final   14/78/2956 2.13 (H) 0.55 - 1.02 mg/dL Final   08/65/7846 9.62 (H) 0.55 - 1.02 mg/dL Final        Pharmacokinetic Considerations and Significant Drug Interactions:  Per linear dose adjustment  Concurrent nephrotoxic meds: Zosyn     Assessment/Plan:  Recommendation(s)  S/p vancomycin  load (~ 22 mg/kg), given continuing AKI will continue to dose by level. Level now is extrapolated to be in the therapeutic range and re-dose is warranted. Will give vancomycin  1000 mg IV x 1 and will check an AM level.   Estimated trough on recommended regimen: Not applicable - dosing by level    Follow-up  Level due: tomorrow with AM labs  A pharmacist will continue to monitor and order levels as appropriate    Please page service pharmacist with questions/clarifications.    Curry Dove, PharmD

## 2024-05-07 NOTE — Unmapped (Signed)
 Pt RASS of -2/-3 during the night. Does not open eyes or follow commands. Will periodically move lower extremities or bite on ETT when agitated. Fent & Prop running for sedation. Thick in-line secretions. NSR on the monitor. Amio drip continued. Levo & Vaso running to maintain MAP >70. Good UOP. NGT w/40mL of dark red blood when residual volume checked. Several watery, bloody bowel movements during the night. Generalized edema & weeping noted. Pt being turned q2 hours to prevent skin breakdown.     Problem: Adult Inpatient Plan of Care  Goal: Plan of Care Review  Outcome: Ongoing - Unchanged  Goal: Patient-Specific Goal (Individualized)  Outcome: Ongoing - Unchanged  Goal: Absence of Hospital-Acquired Illness or Injury  Outcome: Ongoing - Unchanged  Intervention: Identify and Manage Fall Risk  Recent Flowsheet Documentation  Taken 05/07/2024 0400 by Lavera Postal, RN  Safety Interventions: aspiration precautions  Taken 05/07/2024 0200 by Lavera Postal, RN  Safety Interventions: aspiration precautions  Taken 05/07/2024 0000 by Lavera Postal, RN  Safety Interventions: aspiration precautions  Taken 05/06/2024 2000 by Lavera Postal, RN  Safety Interventions: aspiration precautions  Intervention: Prevent Skin Injury  Recent Flowsheet Documentation  Taken 05/07/2024 0400 by Lavera Postal, RN  Positioning for Skin: Right  Taken 05/07/2024 0200 by Lavera Postal, RN  Positioning for Skin: Left  Taken 05/07/2024 0000 by Lavera Postal, RN  Positioning for Skin: Right  Taken 05/06/2024 2200 by Lavera Postal, RN  Positioning for Skin: Left  Taken 05/06/2024 2000 by Lavera Postal, RN  Positioning for Skin: Right  Goal: Optimal Comfort and Wellbeing  Outcome: Ongoing - Unchanged  Goal: Readiness for Transition of Care  Outcome: Ongoing - Unchanged  Goal: Rounds/Family Conference  Outcome: Ongoing - Unchanged     Problem: Skin Injury Risk Increased  Goal: Skin Health and Integrity  Outcome: Ongoing - Unchanged  Intervention: Optimize Skin Protection  Recent Flowsheet Documentation  Taken 05/07/2024 0400 by Lavera Postal, RN  Head of Bed [facility]) Positioning: HOB at 30-45 degrees  Taken 05/07/2024 0200 by Lavera Postal, RN  Head of Bed [facility]) Positioning: HOB at 30-45 degrees  Taken 05/07/2024 0000 by Lavera Postal, RN  Head of Bed [facility]) Positioning: HOB at 30-45 degrees  Taken 05/06/2024 2200 by Lavera Postal, RN  Head of Bed [facility]) Positioning: HOB at 30-45 degrees  Taken 05/06/2024 2000 by Lavera Postal, RN  Head of Bed [facility]) Positioning: HOB at 30-45 degrees     Problem: Fall Injury Risk  Goal: Absence of Fall and Fall-Related Injury  Outcome: Ongoing - Unchanged  Intervention: Promote Injury-Free Environment  Recent Flowsheet Documentation  Taken 05/07/2024 0400 by Lavera Postal, RN  Safety Interventions: aspiration precautions  Taken 05/07/2024 0200 by Lavera Postal, RN  Safety Interventions: aspiration precautions  Taken 05/07/2024 0000 by Lavera Postal, RN  Safety Interventions: aspiration precautions  Taken 05/06/2024 2000 by Lavera Postal, RN  Safety Interventions: aspiration precautions     Problem: Non-Violent Restraints  Goal: Patient will remain free of restraint events  Outcome: Ongoing - Unchanged  Goal: Patient will remain free of physical injury  Outcome: Ongoing - Unchanged     Problem: Wound  Goal: Optimal Coping  Outcome: Ongoing - Unchanged  Goal: Optimal Functional Ability  Outcome: Ongoing - Unchanged  Goal: Absence of Infection Signs and Symptoms  Outcome: Ongoing - Unchanged  Goal: Improved Oral Intake  Outcome: Ongoing - Unchanged  Goal: Optimal Pain Control and Function  Outcome: Ongoing - Unchanged  Goal: Skin Health and Integrity  Outcome:  Ongoing - Unchanged  Intervention: Optimize Skin Protection  Recent Flowsheet Documentation  Taken 05/07/2024 0400 by Lavera Postal, RN  Head of Bed [facility]) Positioning: HOB at 30-45 degrees  Taken 05/07/2024 0200 by Lavera Postal, RN  Head of Bed [facility]) Positioning: HOB at 30-45 degrees  Taken 05/07/2024 0000 by Lavera Postal, RN  Head of Bed [facility]) Positioning: HOB at 30-45 degrees  Taken 05/06/2024 2200 by Lavera Postal, RN  Head of Bed [facility]) Positioning: HOB at 30-45 degrees  Taken 05/06/2024 2000 by Lavera Postal, RN  Head of Bed [facility]) Positioning: HOB at 30-45 degrees  Goal: Optimal Wound Healing  Outcome: Ongoing - Unchanged     Problem: Self-Care Deficit  Goal: Improved Ability to Complete Activities of Daily Living  Outcome: Ongoing - Unchanged     Problem: Mechanical Ventilation Invasive  Goal: Effective Communication  Outcome: Ongoing - Unchanged  Goal: Optimal Device Function  Outcome: Ongoing - Unchanged  Intervention: Optimize Device Care and Function  Recent Flowsheet Documentation  Taken 05/07/2024 0400 by Lavera Postal, RN  Oral Care:   mouth swabbed   suction provided  Taken 05/07/2024 0000 by Lavera Postal, RN  Oral Care: mouth swabbed  Taken 05/06/2024 2000 by Lavera Postal, RN  Oral Care: teeth brushed  Goal: Mechanical Ventilation Liberation  Outcome: Ongoing - Unchanged  Goal: Optimal Nutrition Delivery  Outcome: Ongoing - Unchanged  Goal: Absence of Device-Related Skin and Tissue Injury  Outcome: Ongoing - Unchanged  Goal: Absence of Ventilator-Induced Lung Injury  Outcome: Ongoing - Unchanged  Intervention: Prevent Ventilator-Associated Pneumonia  Recent Flowsheet Documentation  Taken 05/07/2024 0400 by Lavera Postal, RN  Head of Bed [facility]) Positioning: HOB at 30-45 degrees  Oral Care:   mouth swabbed   suction provided  Taken 05/07/2024 0200 by Lavera Postal, RN  Head of Bed [facility]) Positioning: HOB at 30-45 degrees  Taken 05/07/2024 0000 by Lavera Postal, RN  Head of Bed [facility]) Positioning: HOB at 30-45 degrees  Oral Care: mouth swabbed  Taken 05/06/2024 2200 by Lavera Postal, RN  Head of Bed [facility]) Positioning: HOB at 30-45 degrees  Taken 05/06/2024 2000 by Lavera Postal, RN  Head of Bed [facility]) Positioning: HOB at 30-45 degrees  Oral Care: teeth brushed     Problem: Artificial Airway  Goal: Effective Communication  Outcome: Ongoing - Unchanged  Goal: Optimal Device Function  Outcome: Ongoing - Unchanged  Intervention: Optimize Device Care and Function  Recent Flowsheet Documentation  Taken 05/07/2024 0400 by Lavera Postal, RN  Oral Care:   mouth swabbed   suction provided  Taken 05/07/2024 0000 by Lavera Postal, RN  Oral Care: mouth swabbed  Taken 05/06/2024 2000 by Lavera Postal, RN  Oral Care: teeth brushed  Goal: Absence of Device-Related Skin or Tissue Injury  Outcome: Ongoing - Unchanged

## 2024-05-07 NOTE — Unmapped (Signed)
 MICU Daily Progress Note     Date of Service: 05/07/2024    Problem List:   Principal Problem:    Shock    Active Problems:    Type 2 diabetes mellitus, with long-term current use of insulin       Hyperlipidemia    Hypertension    Coronary artery disease involving native heart without angina pectoris    Diabetes mellitus      Esophageal varices in cirrhosis      Hepatic encephalopathy      Calcified cerebral meningioma      Portal hypertension      Other chronic pain    OSA (obstructive sleep apnea)    Stage 3a chronic kidney disease    Hematemesis      Interval history: Ashley Briggs is a 58 y.o. female with NASH cirrhosis (c/b ascites, hepatic encephalopathy, esophageal varices), CAD with prior NSTEMI, CKD who presented to OSH with hematemesis 6 days s/p variceal banding. Emergent TIPS attempted at OSH but not successful. She was transferred to [facility] MICU with 2 pressor shock. EGD 6/4 with significant bleeding from stomach that could not be cleared endoscopically. Acutely increasing pressor requirement on 6/4, ultimately requiring emergent TIPS. She was extubated on 6/5, but re-intubated later in the day for increasing hypoxia. Overnight, she developed sustained wide complex tachycardia. She was stabilized with amiodarone  gtt and diuresed.     24hr events:   - Extubated 6/5, re-intubated later in the day for worsening hypoxemia  - Wide complex tachycardia overnight, stabilized with amiodarone   - Flash pulmonary edema, diuresed   - Stopped octreotide     Neurological   Sedation  Intubated. Sedated with fentanyl  gtt, propofol  gtt  - Triglycerides q96h while on propofol  gtt  - Daily SAT    History of seizure-like activity (stable)  - Continue IV equivalent of home Keppra  750mg  BID    Chronic pain  History of chronic pain, treated with baclofen , naltrexone , oxycodone .   - Hold home baclofen , naltrextone, oxycodone  in the setting of hypotension and sedation    Major depressive disorder (stable)  - Hold home bupoprion, citalopram     Insomnia (stable)  - Hold home zolpidem     PMH calcified cerebral meningioma (stable)  - no intervention at this time    Analgesia: pain adequately controlled  RASS at goal? Yes  [place] Agitation Assessment Scale (RASS): -3-4 (6/6 at 8:00am)    Pulmonary   Intubation and mechanical ventilation  Previously intubated for procedures and altered mental status. Extubated 6/5, re-intubated later the same day for worsening hypoxia. Now continues on PRVC, sedation increased given c/f double triggering.     PRVC  Vt 400, RR 20, PEEP 10, FiO2 40%  ABG 7.35/39.9/148    Pulmonary edema  Increasing hypoxia on 6/5, ultimately requiring re-intubation with FiO2 100%. CXR demonstrated moderate pulmonary edema. Etiology of pulmonary edema most likely cardiogenic given concurrent suspected stress cardiomyopathy, though she is also at risk for ARDS and noncardiogenic pulmonary edema cannot be excluded. She received IV lasix  40mg  with improving oxygen requirement.   - IV lasix  80mg , redose as tolerated    Left pleural effusion   CXR 6/4 remarkable for moderate Left pleural effusion with passive atelectasis. Appears simple on bedside POCUS. Stable on repeat CXR 6/5.   - CTM    Cardiovascular   Undifferentiated shock, c/f mixed hemorrhagic versus cardiogenic shock   Arrived to [facility] MICU in undifferentiated shock requiring 2 pressors. Acutely worsening hypotension on 6/4 requiring fourth  pressor, aggressive fluid resuscitation, and massive transfusion protocol. S/p emergent TIPS, now with decreasing pressor requirement. Shock initially favored to be hemorrhagic, though sepsis could not be excluded. Now with c/f stress cardiomyopathy, may have some component of cardiogenic shock contributing.   - Bcx NGTD x 48 hours  - Ucx pending  - Continue norepi, vaso  - Continue zosyn   - Central venous O2 to further characterize shock    C/f stress cardiomyopathy - Demand Ischemia - Type II NSTEMI  With elevated troponin on admission (5.4k) likely 2/2 demand ischemia in the setting of shock. Echo on admission overall normal with EF > 55%. Developed several episodes of wide complex tachycardia on 6/5. Bedside POCUS with apical ballooning and slightly decreased EF to 45-50%, most concerning for stress cardiomyopathy in the setting of critical illness with hemorrhagic shock. EKG was without acute ischemic changes. Troponin continues to rise (7k -> 9.2k -> 14.5k -> 19.9k). Overall, lower clinical concern for ACS in the absence of EKG changes. Additionally, would expect more impressive troponin peak in the setting of acute kidney disease.   - Cardiology consulted, appreciate recs   - Low concern for true ACS, no need for heparinization or ischemic eval acutely   - Trend troponin to peak    - Ischemic eval outpatient pending clinical course    Wide complex tachycardia - Ventricular tachycardia  Developed VT arrest requiring shock at OSH with initial TIPS attempt. On 6/5 developed several episodes of wide complex tachycardia. EKG with tachycardia to 130s, thought by cardiology to represent a fib with abberancy rather than VT. She was stabilized with amiodarone  bolus followed by amiodarone  gtt.   - Cardiology consulted, appreciate recs   - Amiodarone  gtt   - Consider transition to po amiodarone  pending GI absorption   - Consider long term strategy for rhythm control given decompensated liver disease  - q8h BMP  - Replete K>4, Mg>2    CAD, prior NSTEMI  Prior NSTEMI per chart review. No LHC data available. Unclear if she has history of coronary stent.   - Home aspirin  held ISO bleeding    Renal   AKI, prerenal versus intrarenal   Cr on admission elevated to 1.29 from baseline ~0.8. Cr improved to 0.9 with aggressive fluid resuscitation. Now rising to 1.32. Highest suspicion for ATN given critical illness with shock, though HRS and prerenal etiologies are not excluded.   - Urine lytes to characterize etiology (recently diuresed, will measure FeUrea)  - Management of contributing etiologies as above  - Diuresis as above, would consider nephrology consult should AKI worsen  - q8h BMP     Infectious Disease/Autoimmune   Undifferentiated shock, c/f septic shock - Leukocytosis (resolved)  Presented to [facility] MICU in 2-pressor shock with leukocytosis to 24.9. UA noninfectious, CXR without pneumonia. Bcx NGTD x 24 hours. Most likely infectious source intra-abdominal. SBP considered, unfortunately ascites from paracentesis on 6/4 were not sent for diagnostic studies. No skin findings on exam to suggest SSTI. Femoral line placed at OSH possibly an infectious source, though Bcx have been NGTD. S/p vanc/zosyn . S/p tobramycin  x 1. Overall, infectious workup reassuring against infection, though all infectious sources cannot be ruled out.     Fever  Patient spiked a fever again to 101.1 on 6/6 so received vancomycin  in addition to their zosyn . No signs of obvious infection on exam. CXR without significant worsening of effusion or other signs of focal consolidation. Lower resp culture with GPC. No signs of infection in urine.  Now on stress dose steroids.  -Consider paracentesis/thoracentesis  - Follow up cryptococcal antigen  - Dw GI      Cultures:  Blood Culture, Routine (no units)   Date Value   05/04/2024 No Growth at 48 hours   05/04/2024 No Growth at 48 hours     Urine Culture, Comprehensive (no units)   Date Value   05/04/2024 NO GROWTH     Lower Respiratory Culture (no units)   Date Value   05/06/2024 Specimen Not Processed     WBC (10*9/L)   Date Value   05/07/2024 10.0     WBC, UA (/HPF)   Date Value   05/06/2024 1            FEN/GI   Refractory portal hypertensive bleeding - Decompensated cirrhosis - Ascites - Hx hepatic encephalopathy  Hx of NASH cirrhosis presenting to OSH with hematemsis 6 days after esophageal variceal banding. EDG at OSH demonstrated bleeding esophageal ulcers. Emergent TIPS at OSH not successful due to inability to access the portal vein, raising concern for portal vein thrombus. EGD on 6/4 demonstrated nonbleeding esophageal ulcers at side of prior banding, stomach completely occluded by blood which could not be cleared endoscopically. With acute decline in Hb and hypotension on 6/4. Darius Edouard attempted with GI, but unsuccessful after numerous attempts. S/p emergent TIPS on 6/4. Paracentesis during TIPS drained 4.7L, unfortunately not sent for any diagnostic studies. Continuing to pass large volume dark red stools, Hb stable. Suspect melena is representative of prior bleeding and not indicative of active GI bleeding. Now with rising LFTs suggesting some degree of liver injury, likely 2/2 shock. Hepatology initiating transplant evaluation.  - S/p emergent TIPS  - Call GI to discuss continued bright red blood in NG and stool  - Hepatology c/s, appreciate recs  - Maintain fibrinogen > 100 with cryo  - Consider diagnostic paracentesis pending clinical course  - Daily MELLD labs (CMP, PT/INR)  - Home lasix , spironolactone  held d/t hypotension  - Stop octreotide  now s/p TIPS  - Resume home lactulose , rifaximin     Malnutrition Assessment:   Now NPO for several days, NG tube in place. Would consider starting enteral nutrition.  - Nutrition consult       Heme/Coag   Blood loss anemia, likely 2/2 refractory portal hypertensive bleeding (stable)  S/p 3 units pRBCs at OSH, Hb 9.3 on arrival to MICU. S/p massive transfusion protocol on 6/4. Low clinical concern for ongoing blood loss with Hb stable at 10.4 on 6/6, last transfusion 6/4.   - q8h CBC with diff  - Management of GI bleed as above    Thrombocytopenia (improving)  Plts 253k on admission, acute decline to 50k on 6/5. Currently without signs of active bleeding. Improving to plts 99k on 6/6.   - q8h CBC with diff    Endocrine   Insulin -dependent T2DM  A1c 5.6 on admission. Home regimen lantus  60, lispro with meals. Has been on NPH 15 units, requiring 9 units lispro overnight. Given high sugars and now on sepsis dose steroids will increase NPH.   - Increase to NPH 24 units BID  - SSI  - q6h BG checks    Integumentary   Psoriasis (stable)  History of psoriasis per chart review. With numerous chronic appearing scattered erythematous plaques with silvery scale. Of note, on Secukinumab  for psoriasis management, which can increase risk for infection.    # Sacral wound, 01/2024, unclear if still present  - WOCN consulted for high risk  skin assessment Yes.  - cont pressure mitigating precautions per skin policy    Prophylaxis/LDA/Restraints/Consults   ICU checklist completed: yes (see ICU rounding navigator in Epic)    Patient Lines/Drains/Airways Status       Active Active Lines, Drains, & Airways       Name Placement date Placement time Site Days    ETT  7.5 05/05/24  1810  -- 1    CVC Triple Lumen 05/05/24 Non-tunneled Left Internal jugular 05/05/24  1740  Internal jugular  1    NG/OG Tube Right nostril 05/06/24  1930  Right nostril  less than 1    Urethral Catheter 05/04/24  0500  --  3    Peripheral IV 05/04/24 Anterior;Left;Upper Arm 05/04/24  0500  Arm  3    Peripheral IV 05/04/24 Anterior;Right;Upper Arm 05/04/24  0500  Arm  3    Peripheral IV 05/06/24 Right Arm 05/06/24  1716  Arm  less than 1    Arterial Line 05/06/24 Right Radial 05/06/24  1716  Radial  less than 1                  Patient Lines/Drains/Airways Status       Active Wounds       Name Placement date Placement time Site Days    Surgical Site 05/04/24 Abdomen Lower;Right 05/04/24  2300  -- 2    Surgical Site 05/04/24 Neck Right 05/04/24  2300  -- 2    Surgical Site 05/04/24 Groin Right 05/04/24  2300  -- 2                  VTE ppx: SCDs   Bowel regimen: lactulose  and rifaximin    WOCN consult for sacral wounds  - Will remove femoral line given infectious risk 2/2 proximity to large volume stools    Goals of Care     Code Status: Full Code    Public relations account executive Maker:  Ms. Aday current decisional capacity for healthcare decision-making is Full capacity. Her designated healthcare decision maker(s) is/are   HCDM (patient stated preference): Caudle,Ryan - Relative - [phone].      Subjective     Extubated yesterday, reintubated later in the day for hypoxemia. Overnight with sustained wide complex tachycardia. Cardiology fellow performed bedside POCUS with concern for bibasilar contractility and possible apical ballooning. No acute ischemic changes found on EKG. Stabilized with amiodarone , received IV lasix  40mg  for pulmonary edema with ~75cc UOP.     Objective     Vitals - past 24 hours  Temp:  [37.1 ??C (98.8 ??F)-38.4 ??C (101.1 ??F)] 37.7 ??C (99.8 ??F)  Pulse:  [83-103] 83  SpO2 Pulse:  [83-100] 83  Resp:  [12-28] 25  FiO2 (%):  [40 %-60 %] 40 %  SpO2:  [91 %-98 %] 96 % Intake/Output  I/O last 3 completed shifts:  In: 3539.2 [I.V.:2676.7; NG/GT:90; IV Piggyback:772.5]  Out: 1885 [Urine:1860; Emesis/NG output:25]     Physical Exam:    General: Ill-appearing female, lying in bed. Intubated and sedated.   HEENT: Mucous membranes dry.  CV: Tachycardic. Regular rhythm.   Pulm: CTAB on anterior exam.  GI: Abd soft, nontender.  Extremities: Pulses weak but palpable. 1+ pitting edema to bilateral lower extremities.   Skin: Diffuse scattered erythematous plaques with silvery scale. No bruising, petechiae, mucocutaneous bleeding.  Neuro: Intubated and sedated. Minimally responsive to voice or touch.     Continuous Infusions:    amiodarone  0.5 mg/min (05/07/24 0230)  fentaNYL  citrate (PF) 50 mcg/mL infusion 100 mcg/hr (05/06/24 2345)    NORepinephrine  bitartrate-NS 18 mcg/min (05/07/24 0700)    propofol  10 mg/mL infusion 20 mcg/kg/min (05/07/24 0223)    vasopressin  0.03 Units/min (05/07/24 0001)       Scheduled Medications:    hydrocortisone  sod succ  50 mg Intravenous Q6H    insulin  lispro  0-20 Units Subcutaneous Q6H    insulin  NPH  20 Units Subcutaneous Q12H [facility]    lactulose   20 g Enteral tube: gastric QID    levETIRAcetam   750 mg Intravenous Q12H [facility] pantoprazole  (Protonix ) intravenous solution  40 mg Intravenous BID    piperacillin -tazobactam  4.5 g Intravenous Q8H [facility]    rifAXIMin   550 mg Enteral tube: gastric BID    sodium chloride   10 mL Intravenous Q8H    sodium chloride   10 mL Intravenous Q8H    sodium chloride   10 mL Intravenous Q8H    Vancomycin  - Pharmacy dosing by levels   Other Pharmacy dosing       PRN medications:  dextrose  in water , fentaNYL  (PF) **OR** fentaNYL  (PF), glucagon, glucose, midazolam , phenylephrine     Data/Imaging Review: Reviewed in Epic and personally interpreted on 05/07/2024. See EMR for detailed results.       Macarthur Savory,  MD

## 2024-05-07 NOTE — Unmapped (Signed)
 Paracentesis Procedure Note       PRE-PROCEDURE    Date of Service: 05/07/2024    Patient Name:: Ashley Briggs  Patient MRN: [id number]    Requesting Service: Medical ICU (MDI)    Indications:  Ascites with fever    Known Bleeding Diathesis: Patient/caregiver denies any known bleeding or platelet disorder.     Antiplatelet Agents: This patient is not on an antiplatelet agent.    Systemic Anticoagulation: This patient is not on full systemic anticoagulation.    Significant Labs:  INR   Date Value Ref Range Status   05/07/2024 1.61  Final   01/31/2011 1.1  Final     PT   Date Value Ref Range Status   05/07/2024 18.4 (H) 9.9 - 12.6 sec Final   01/31/2011 12.2 9.7 - 12.6 SECONDS Final     APTT   Date Value Ref Range Status   05/04/2024 39.8 (H) 24.8 - 38.4 sec Final   01/31/2011 30.7 24.1 - 32.5 SECONDS Final     Platelet   Date Value Ref Range Status   05/07/2024 72 (L) 150 - 450 10*9/L Final   01/31/2011 256 150 - 440 x10 9th/L Final       Consent:   I explained the potential benefits and risks of the procedure, including  ascitic fluid leakage, cardiac complications, hematoma formation, local infection, nerve/tissue damage, bowel perforation and sepsis. I explained potential alternatives. The patient/HCDM understands these risks, agrees to the procedure, and signed the informed consent form.    PERI-PROCEDURE    Time-out was performed immediately prior to the procedure to verify correct patient, procedure, site, positioning, and special equipment if applicable.    The head of the bed was placed at 45 degrees above level and a large area of ascitic fluid was identified by ultrasound in the left lower quadrant.  The linear probe with color doppler was used to ensure that a vessel was not located in the proposed needle path.    Diagnostic   The site was cleaned with Chlorhexidine . Local anesthesia with 1% lidocaine  was introduced subcutaneously then deep to the skin until the parietal peritoneum was anesthetized. A 20 g straight needle was introduced into this site until ascitic fluid was encountered.  Ascitic fluid and the needle were removed with minimal bleeding.  A sterile bandage was placed after holding pressure.     POST-PROCEDURE    Findings: 0.2 liters of cloudy and bloody ascites fluid was obtained and sent for Cell count and diff, Cytology, Culture, Albumin , Total Protein, LDH, and Bilirubin.    Estimated Blood Loss: 2 ml    Condition:  The patient tolerated the procedure well and remains in the same condition as pre-procedure.    Complications: none    Plan:  Follow up studies      Macarthur Savory, MD

## 2024-05-08 LAB — CBC W/ AUTO DIFF
BASOPHILS ABSOLUTE COUNT: 0 10*9/L (ref 0.0–0.1)
BASOPHILS ABSOLUTE COUNT: 0 10*9/L (ref 0.0–0.1)
BASOPHILS ABSOLUTE COUNT: 0 10*9/L (ref 0.0–0.1)
BASOPHILS RELATIVE PERCENT: 0.2 %
BASOPHILS RELATIVE PERCENT: 0.2 %
BASOPHILS RELATIVE PERCENT: 0.4 %
EOSINOPHILS ABSOLUTE COUNT: 0 10*9/L (ref 0.0–0.5)
EOSINOPHILS ABSOLUTE COUNT: 0 10*9/L (ref 0.0–0.5)
EOSINOPHILS ABSOLUTE COUNT: 0 10*9/L (ref 0.0–0.5)
EOSINOPHILS RELATIVE PERCENT: 0 %
EOSINOPHILS RELATIVE PERCENT: 0.1 %
EOSINOPHILS RELATIVE PERCENT: 0 %
HEMATOCRIT: 28.9 % — ABNORMAL LOW (ref 34.0–44.0)
HEMATOCRIT: 29.6 % — ABNORMAL LOW (ref 34.0–44.0)
HEMATOCRIT: 28.2 % — ABNORMAL LOW (ref 34.0–44.0)
HEMOGLOBIN: 10.1 g/dL — ABNORMAL LOW (ref 11.3–14.9)
HEMOGLOBIN: 10.2 g/dL — ABNORMAL LOW (ref 11.3–14.9)
HEMOGLOBIN: 9.7 g/dL — ABNORMAL LOW (ref 11.3–14.9)
LYMPHOCYTES ABSOLUTE COUNT: 0.6 10*9/L — ABNORMAL LOW (ref 1.1–3.6)
LYMPHOCYTES ABSOLUTE COUNT: 0.5 10*9/L — ABNORMAL LOW (ref 1.1–3.6)
LYMPHOCYTES ABSOLUTE COUNT: 0.6 10*9/L — ABNORMAL LOW (ref 1.1–3.6)
LYMPHOCYTES RELATIVE PERCENT: 6.6 %
LYMPHOCYTES RELATIVE PERCENT: 5.7 %
LYMPHOCYTES RELATIVE PERCENT: 5.7 %
MEAN CORPUSCULAR HEMOGLOBIN CONC: 34.8 g/dL (ref 32.0–36.0)
MEAN CORPUSCULAR HEMOGLOBIN CONC: 34.4 g/dL (ref 32.0–36.0)
MEAN CORPUSCULAR HEMOGLOBIN CONC: 34.5 g/dL (ref 32.0–36.0)
MEAN CORPUSCULAR HEMOGLOBIN: 30.3 pg (ref 25.9–32.4)
MEAN CORPUSCULAR HEMOGLOBIN: 29.8 pg (ref 25.9–32.4)
MEAN CORPUSCULAR HEMOGLOBIN: 29.9 pg (ref 25.9–32.4)
MEAN CORPUSCULAR VOLUME: 87 fL (ref 77.6–95.7)
MEAN CORPUSCULAR VOLUME: 86.8 fL (ref 77.6–95.7)
MEAN CORPUSCULAR VOLUME: 86.7 fL (ref 77.6–95.7)
MEAN PLATELET VOLUME: 7.5 fL (ref 6.8–10.7)
MEAN PLATELET VOLUME: 8.2 fL (ref 6.8–10.7)
MEAN PLATELET VOLUME: 7.7 fL (ref 6.8–10.7)
MONOCYTES ABSOLUTE COUNT: 0.9 10*9/L — ABNORMAL HIGH (ref 0.3–0.8)
MONOCYTES ABSOLUTE COUNT: 0.7 10*9/L (ref 0.3–0.8)
MONOCYTES ABSOLUTE COUNT: 1.4 10*9/L — ABNORMAL HIGH (ref 0.3–0.8)
MONOCYTES RELATIVE PERCENT: 10.8 %
MONOCYTES RELATIVE PERCENT: 8.6 %
MONOCYTES RELATIVE PERCENT: 12.8 %
NEUTROPHILS ABSOLUTE COUNT: 7 10*9/L (ref 1.8–7.8)
NEUTROPHILS ABSOLUTE COUNT: 7.4 10*9/L (ref 1.8–7.8)
NEUTROPHILS ABSOLUTE COUNT: 8.9 10*9/L — ABNORMAL HIGH (ref 1.8–7.8)
NEUTROPHILS RELATIVE PERCENT: 82.4 %
NEUTROPHILS RELATIVE PERCENT: 85.4 %
NEUTROPHILS RELATIVE PERCENT: 81.1 %
PLATELET COUNT: 103 10*9/L — ABNORMAL LOW (ref 150–450)
PLATELET COUNT: 108 10*9/L — ABNORMAL LOW (ref 150–450)
PLATELET COUNT: 111 10*9/L — ABNORMAL LOW (ref 150–450)
RED BLOOD CELL COUNT: 3.32 10*12/L — ABNORMAL LOW (ref 3.95–5.13)
RED BLOOD CELL COUNT: 3.42 10*12/L — ABNORMAL LOW (ref 3.95–5.13)
RED BLOOD CELL COUNT: 3.25 10*12/L — ABNORMAL LOW (ref 3.95–5.13)
RED CELL DISTRIBUTION WIDTH: 17.3 % — ABNORMAL HIGH (ref 12.2–15.2)
RED CELL DISTRIBUTION WIDTH: 17.7 % — ABNORMAL HIGH (ref 12.2–15.2)
RED CELL DISTRIBUTION WIDTH: 18.1 % — ABNORMAL HIGH (ref 12.2–15.2)
WBC ADJUSTED: 8.5 10*9/L (ref 3.6–11.2)
WBC ADJUSTED: 8.6 10*9/L (ref 3.6–11.2)
WBC ADJUSTED: 11 10*9/L (ref 3.6–11.2)

## 2024-05-08 LAB — COMPREHENSIVE METABOLIC PANEL
ALBUMIN: 3.7 g/dL (ref 3.4–5.0)
ALBUMIN: 3.6 g/dL (ref 3.4–5.0)
ALBUMIN: 3.9 g/dL (ref 3.4–5.0)
ALKALINE PHOSPHATASE: 88 U/L (ref 46–116)
ALKALINE PHOSPHATASE: 91 U/L (ref 46–116)
ALKALINE PHOSPHATASE: 83 U/L (ref 46–116)
ALT (SGPT): 598 U/L — ABNORMAL HIGH (ref 10–49)
ALT (SGPT): 632 U/L — ABNORMAL HIGH (ref 10–49)
ALT (SGPT): 577 U/L — ABNORMAL HIGH (ref 10–49)
ANION GAP: 12 mmol/L (ref 5–14)
ANION GAP: 12 mmol/L (ref 5–14)
ANION GAP: 15 mmol/L — ABNORMAL HIGH (ref 5–14)
AST (SGOT): 1469 U/L — ABNORMAL HIGH (ref <=34)
AST (SGOT): 1431 U/L — ABNORMAL HIGH (ref <=34)
AST (SGOT): 1232 U/L — ABNORMAL HIGH (ref <=34)
BILIRUBIN TOTAL: 12.8 mg/dL — ABNORMAL HIGH (ref 0.3–1.2)
BILIRUBIN TOTAL: 12.6 mg/dL — ABNORMAL HIGH (ref 0.3–1.2)
BILIRUBIN TOTAL: 12.6 mg/dL — ABNORMAL HIGH (ref 0.3–1.2)
BLOOD UREA NITROGEN: 81 mg/dL — ABNORMAL HIGH (ref 9–23)
BLOOD UREA NITROGEN: 83 mg/dL — ABNORMAL HIGH (ref 9–23)
BLOOD UREA NITROGEN: 80 mg/dL — ABNORMAL HIGH (ref 9–23)
BUN / CREAT RATIO: 49
BUN / CREAT RATIO: 51
BUN / CREAT RATIO: 48
CALCIUM: 9.7 mg/dL (ref 8.7–10.4)
CALCIUM: 9.5 mg/dL (ref 8.7–10.4)
CALCIUM: 9.9 mg/dL (ref 8.7–10.4)
CHLORIDE: 104 mmol/L (ref 98–107)
CHLORIDE: 105 mmol/L (ref 98–107)
CHLORIDE: 106 mmol/L (ref 98–107)
CO2: 22 mmol/L (ref 20.0–31.0)
CO2: 23 mmol/L (ref 20.0–31.0)
CO2: 23 mmol/L (ref 20.0–31.0)
CREATININE: 1.66 mg/dL — ABNORMAL HIGH (ref 0.55–1.02)
CREATININE: 1.63 mg/dL — ABNORMAL HIGH (ref 0.55–1.02)
CREATININE: 1.65 mg/dL — ABNORMAL HIGH (ref 0.55–1.02)
EGFR CKD-EPI (2021) FEMALE: 35 mL/min/1.73m2 — ABNORMAL LOW (ref >=60)
EGFR CKD-EPI (2021) FEMALE: 36 mL/min/1.73m2 — ABNORMAL LOW (ref >=60)
EGFR CKD-EPI (2021) FEMALE: 36 mL/min/1.73m2 — ABNORMAL LOW (ref >=60)
GLUCOSE RANDOM: 403 mg/dL (ref 70–99)
GLUCOSE RANDOM: 385 mg/dL — ABNORMAL HIGH (ref 70–179)
GLUCOSE RANDOM: 134 mg/dL — ABNORMAL HIGH (ref 70–99)
POTASSIUM: 4.3 mmol/L (ref 3.4–4.8)
POTASSIUM: 4.7 mmol/L (ref 3.4–4.8)
POTASSIUM: 3.6 mmol/L (ref 3.4–4.8)
PROTEIN TOTAL: 6.3 g/dL (ref 5.7–8.2)
PROTEIN TOTAL: 6.2 g/dL (ref 5.7–8.2)
PROTEIN TOTAL: 6.5 g/dL (ref 5.7–8.2)
SODIUM: 138 mmol/L (ref 135–145)
SODIUM: 140 mmol/L (ref 135–145)
SODIUM: 144 mmol/L (ref 135–145)

## 2024-05-08 LAB — ENDOTOOL
ENDOTOOL GLUCOSE: 347 mg/dL — ABNORMAL HIGH (ref 140–180)
ENDOTOOL GLUCOSE: 375 mg/dL — ABNORMAL HIGH (ref 140–180)
ENDOTOOL GLUCOSE: 288 mg/dL — ABNORMAL HIGH (ref 140–180)
ENDOTOOL GLUCOSE: 246 mg/dL — ABNORMAL HIGH (ref 140–180)
ENDOTOOL GLUCOSE: 208 mg/dL — ABNORMAL HIGH (ref 140–180)
ENDOTOOL GLUCOSE: 187 mg/dL — ABNORMAL HIGH (ref 140–180)
ENDOTOOL GLUCOSE: 158 mg/dL (ref 140–180)
ENDOTOOL GLUCOSE: 125 mg/dL — ABNORMAL LOW (ref 140–180)
ENDOTOOL GLUCOSE: 129 mg/dL — ABNORMAL LOW (ref 140–180)
ENDOTOOL GLUCOSE: 114 mg/dL — ABNORMAL LOW (ref 140–180)
ENDOTOOL GLUCOSE: 110 mg/dL — ABNORMAL LOW (ref 140–180)
ENDOTOOL INSULIN RATE: 16 U/h
ENDOTOOL INSULIN RATE: 21 U/h
ENDOTOOL INSULIN RATE: 19 U/h
ENDOTOOL INSULIN RATE: 14 U/h
ENDOTOOL INSULIN RATE: 9.5 U/h
ENDOTOOL INSULIN RATE: 7.5 U/h
ENDOTOOL INSULIN RATE: 4.4 U/h
ENDOTOOL INSULIN RATE: 1.8 U/h
ENDOTOOL INSULIN RATE: 3 U/h
ENDOTOOL INSULIN RATE: 1.6 U/h
ENDOTOOL INSULIN RATE: 1.8 U/h

## 2024-05-08 LAB — TRIGLYCERIDES: TRIGLYCERIDES: 206 mg/dL — ABNORMAL HIGH (ref <150)

## 2024-05-08 LAB — PROTIME-INR
INR: 1.4
INR: 1.37
PROTIME: 16 s — ABNORMAL HIGH (ref 9.9–12.6)
PROTIME: 15.6 s — ABNORMAL HIGH (ref 9.9–12.6)

## 2024-05-08 LAB — MAGNESIUM
MAGNESIUM: 2.5 mg/dL (ref 1.6–2.6)
MAGNESIUM: 2.7 mg/dL — ABNORMAL HIGH (ref 1.6–2.6)
MAGNESIUM: 2.6 mg/dL (ref 1.6–2.6)
MAGNESIUM: 2.5 mg/dL (ref 1.6–2.6)

## 2024-05-08 LAB — VANCOMYCIN, RANDOM: VANCOMYCIN RANDOM: 18.6 ug/mL

## 2024-05-08 LAB — FIBRINOGEN: FIBRINOGEN LEVEL: 162 mg/dL — ABNORMAL LOW (ref 175–500)

## 2024-05-08 MED ADMIN — lactulose oral solution: 20 g | GASTROENTERAL | @ 10:00:00

## 2024-05-08 MED ADMIN — insulin lispro (HumaLOG) injection 9 Units: 9 [IU] | SUBCUTANEOUS | @ 02:00:00 | Stop: 2024-05-07

## 2024-05-08 MED ADMIN — lactated ringers bolus 500 mL: 500 mL | INTRAVENOUS | @ 23:00:00 | Stop: 2024-05-08

## 2024-05-08 MED ADMIN — insulin regular 100 unit/100 mL (1 unit/mL) in sodium chloride 0.9% 100 mL: 0-50 [IU]/h | INTRAVENOUS | @ 17:00:00

## 2024-05-08 MED ADMIN — fentaNYL PF (SUBLIMAZE) (50 mcg/mL) infusion (bag): 0-200 ug/h | INTRAVENOUS | @ 03:00:00 | Stop: 2024-05-19

## 2024-05-08 MED ADMIN — sodium chloride (NS) 0.9 % flush 10 mL: 10 mL | INTRAVENOUS | @ 08:00:00

## 2024-05-08 MED ADMIN — pantoprazole (Protonix) injection 40 mg: 40 mg | INTRAVENOUS

## 2024-05-08 MED ADMIN — albumin human 25 % 50 g: 50 g | INTRAVENOUS | @ 15:00:00 | Stop: 2024-05-08

## 2024-05-08 MED ADMIN — amiodarone 360 mg in dextrose 5% 200 mL (1.8 mg/mL) infusion PMB: .5 mg/min | INTRAVENOUS | @ 18:00:00

## 2024-05-08 MED ADMIN — sodium chloride (NS) 0.9 % flush 10 mL: 10 mL | INTRAVENOUS | @ 16:00:00

## 2024-05-08 MED ADMIN — vasopressin 20 units in 100 mL (0.2 units/mL) infusion premade vial: .03 [IU]/min | INTRAVENOUS | @ 18:00:00

## 2024-05-08 MED ADMIN — levETIRAcetam (KEPPRA) injection 750 mg: 750 mg | INTRAVENOUS

## 2024-05-08 MED ADMIN — propofol (DIPRIVAN) infusion 10 mg/mL: 0-30 ug/kg/min | INTRAVENOUS | @ 08:00:00

## 2024-05-08 MED ADMIN — amiodarone 360 mg in dextrose 5% 200 mL (1.8 mg/mL) infusion PMB: .5 mg/min | INTRAVENOUS | @ 07:00:00

## 2024-05-08 MED ADMIN — insulin regular 100 unit/100 mL (1 unit/mL) in sodium chloride 0.9% 100 mL: 0-50 [IU]/h | INTRAVENOUS | @ 22:00:00

## 2024-05-08 MED ADMIN — rifAXIMin (XIFAXAN) oral suspension: 550 mg | GASTROENTERAL | Stop: 2024-05-10

## 2024-05-08 MED ADMIN — sodium chloride (NS) 0.9 % flush 10 mL: 10 mL | INTRAVENOUS

## 2024-05-08 MED ADMIN — hydrocortisone sod succ (Solu-CORTEF) injection 50 mg: 50 mg | INTRAVENOUS | @ 08:00:00

## 2024-05-08 MED ADMIN — lactulose oral solution: 20 g | GASTROENTERAL

## 2024-05-08 MED ADMIN — lactulose oral solution: 20 g | GASTROENTERAL | @ 21:00:00

## 2024-05-08 MED ADMIN — piperacillin-tazobactam (ZOSYN) IVPB (premix) 4.5 g: 4.5 g | INTRAVENOUS | @ 01:00:00 | Stop: 2024-05-11

## 2024-05-08 MED ADMIN — fentaNYL (PF) (SUBLIMAZE) injection 100 mcg: 100 ug | INTRAVENOUS | @ 20:00:00 | Stop: 2024-05-18

## 2024-05-08 MED ADMIN — insulin lispro (HumaLOG) injection 9 Units: 9 [IU] | SUBCUTANEOUS | @ 08:00:00 | Stop: 2024-05-08

## 2024-05-08 MED ADMIN — insulin NPH (HumuLIN,NovoLIN) injection 30 Units: 30 [IU] | SUBCUTANEOUS | @ 11:00:00 | Stop: 2024-05-08

## 2024-05-08 MED ADMIN — NORepinephrine 8 mg in dextrose 5 % 250 mL (32 mcg/mL) infusion PMB: 0-40 ug/min | INTRAVENOUS | @ 03:00:00

## 2024-05-08 MED ADMIN — hydrocortisone sod succ (Solu-CORTEF) injection 50 mg: 50 mg | INTRAVENOUS | @ 01:00:00

## 2024-05-08 MED ADMIN — levETIRAcetam (KEPPRA) injection 750 mg: 750 mg | INTRAVENOUS | @ 13:00:00

## 2024-05-08 MED ADMIN — lactated ringers bolus 500 mL: 500 mL | INTRAVENOUS | @ 20:00:00 | Stop: 2024-05-08

## 2024-05-08 MED ADMIN — hydrocortisone sod succ (Solu-CORTEF) injection 50 mg: 50 mg | INTRAVENOUS | @ 20:00:00

## 2024-05-08 MED ADMIN — insulin lispro (HumaLOG) injection 0-20 Units: 0-20 [IU] | SUBCUTANEOUS | @ 04:00:00 | Stop: 2024-05-08

## 2024-05-08 MED ADMIN — hydrocortisone sod succ (Solu-CORTEF) injection 50 mg: 50 mg | INTRAVENOUS | @ 13:00:00

## 2024-05-08 MED ADMIN — vancomycin (VANCOCIN) 750 mg in dextrose 5 % 150 mL IVPB (premix): 750 mg | INTRAVENOUS | @ 18:00:00 | Stop: 2024-05-08

## 2024-05-08 MED ADMIN — insulin lispro (HumaLOG) injection 0-20 Units: 0-20 [IU] | SUBCUTANEOUS | @ 10:00:00 | Stop: 2024-05-08

## 2024-05-08 MED ADMIN — insulin lispro (HumaLOG) injection 0-20 Units: 0-20 [IU] | SUBCUTANEOUS | @ 16:00:00 | Stop: 2024-05-08

## 2024-05-08 MED ADMIN — vasopressin 20 units in 100 mL (0.2 units/mL) infusion premade vial: .03 [IU]/min | INTRAVENOUS | @ 10:00:00 | Stop: 2024-05-08

## 2024-05-08 MED ADMIN — lactulose oral solution: 20 g | GASTROENTERAL | @ 16:00:00

## 2024-05-08 MED ADMIN — pantoprazole (Protonix) injection 40 mg: 40 mg | INTRAVENOUS | @ 13:00:00

## 2024-05-08 MED ADMIN — piperacillin-tazobactam (ZOSYN) IVPB (premix) 4.5 g: 4.5 g | INTRAVENOUS | @ 17:00:00 | Stop: 2024-05-11

## 2024-05-08 MED ADMIN — piperacillin-tazobactam (ZOSYN) IVPB (premix) 4.5 g: 4.5 g | INTRAVENOUS | @ 10:00:00 | Stop: 2024-05-11

## 2024-05-08 MED ADMIN — rifAXIMin (XIFAXAN) oral suspension: 550 mg | GASTROENTERAL | @ 13:00:00 | Stop: 2024-05-10

## 2024-05-08 NOTE — Unmapped (Signed)
 MICU Daily Progress Note     Date of Service: 05/08/2024    Problem List:   Principal Problem:    Shock    Active Problems:    Type 2 diabetes mellitus, with long-term current use of insulin       Hyperlipidemia    Hypertension    Coronary artery disease involving native heart without angina pectoris    Diabetes mellitus      Esophageal varices in cirrhosis      Hepatic encephalopathy      Calcified cerebral meningioma      Portal hypertension      Other chronic pain    OSA (obstructive sleep apnea)    Stage 3a chronic kidney disease    Hematemesis      Interval history: Ashley Briggs is a 58 y.o. female with NASH cirrhosis (c/b ascites, hepatic encephalopathy, esophageal varices), CAD with prior NSTEMI, CKD who presented to OSH with hematemesis 6 days s/p variceal banding. Emergent TIPS attempted at OSH but not successful. She was transferred to [facility] MICU with 2 pressor shock. EGD 6/4 with significant bleeding from stomach that could not be cleared endoscopically. Acutely increasing pressor requirement on 6/4, ultimately requiring emergent TIPS. She was extubated on 6/5, but re-intubated later in the day for increasing hypoxia. She developed stress cardiomyopathy with pulmonary edema. Sustained wide complex tachycardia, now continues on amiodarone  gtt.     24hr events:   - RASS -5, weaning propofol   - BG in the 400s overnight  - S/p thoracentesis, paracentesis for infectious workup (unrevealing)    Neurological   Sedation  Intubated. Sedated with fentanyl  gtt, propofol  gtt  - Triglycerides q96h while on propofol  gtt  - Wean propofol  as tolerated  - Daily SAT    History of seizure-like activity (stable)  - Continue IV equivalent of home Keppra  750mg  BID    Chronic pain  History of chronic pain, treated with baclofen , naltrexone , oxycodone .   - Hold home baclofen , naltrextone, oxycodone  in the setting of hypotension and sedation    Major depressive disorder (stable)  - Hold home bupoprion, citalopram     Insomnia (stable)  - Hold home zolpidem     PMH calcified cerebral meningioma (stable)  - no intervention at this time    Analgesia: pain adequately controlled  RASS at goal? Yes  [place] Agitation Assessment Scale (RASS): -3-4 (6/6 at 8:00am)    Pulmonary   Intubation and mechanical ventilation  Previously intubated for procedures and altered mental status. Extubated 6/5, re-intubated later the same day for worsening hypoxia. Now continues on PRVC. Previously with c/f double triggering. Will wean sedation and monitor for recurrent double triggering.    PRVC  Vt 400, RR 20, PEEP 8, FiO2 35%  ABG 7.39/35.7/92    Pulmonary edema  Increasing hypoxia on 6/5, ultimately requiring re-intubation with FiO2 100%. CXR demonstrated moderate pulmonary edema. Etiology of pulmonary edema most likely cardiogenic given concurrent suspected stress cardiomyopathy, though she is also at risk for ARDS and noncardiogenic pulmonary edema cannot be excluded. She received IV lasix  40mg  with improving oxygen requirement. Suspect intravascular depletion given pre-renal AKI, will hold further diuresis.  - Hold further diuresis ISO intravascular depletion   - IV albumin  50g 25%, goal net neutral I/O    Left pleural effusion   CXR 6/4 remarkable for moderate Left pleural effusion with passive atelectasis. S/p thoracentesis on 6/7. Pleural fluid c/w transudative effusion by protein parameters.   - CTM    Cardiovascular   Undifferentiated  shock, c/f mixed hemorrhagic versus cardiogenic shock   Arrived to [facility] MICU in undifferentiated shock requiring 2 pressors. Acutely worsening hypotension on 6/4 requiring fourth pressor, aggressive fluid resuscitation, and massive transfusion protocol. S/p emergent TIPS with ongoing pressor requirement. Isolated fever on 6/6, prerenal AKI, and central venous O2 nearly 90 concerning for some component of septic shock.  - Continue norepi, vaso   - MAP goal > 70 given prerenal AKI in the setting of cirrhosis  - Continue vanc/zosyn   - Continue stress dose steroids  - Infectious management as below    C/f stress cardiomyopathy - Demand Ischemia - Type II NSTEMI  With elevated troponin on admission (5.4k) likely 2/2 demand ischemia in the setting of shock. Echo on admission overall normal with EF > 55%. Developed several episodes of wide complex tachycardia on 6/5. Bedside POCUS with apical ballooning and slightly decreased EF to 45-50%. EKG was without acute ischemic changes. Troponin peaked at 23k (6/6). Formal TTE demonstrated reduced EF to 45% with basal and midventricular hypokinesis. Findings favored to represent stress cardiomyopathy rather than ACS in the absence of acute EKG changes.   - Cardiology consulted, appreciate recs   - Low concern for true ACS, no need for heparinization or ischemic eval acutely    Wide complex tachycardia - Ventricular tachycardia  Developed VT arrest requiring shock at OSH with initial TIPS attempt. On 6/5 developed several episodes of wide complex tachycardia. EKG with tachycardia to 130s, thought by cardiology to represent a fib with abberancy rather than VT. She was stabilized with amiodarone  bolus, continues on amiodarone  gtt.  - Cardiology consulted, appreciate recs   - Amiodarone  gtt   - Consider transition to po amiodarone  pending GI absorption   - Consider long term strategy for rhythm control given decompensated liver disease  - q8h BMP  - Replete K>4, Mg>2    CAD, prior NSTEMI  Prior NSTEMI per chart review. No LHC data available. Unclear if she has history of coronary stent.   - Home aspirin  held ISO bleeding    Renal   Prerenal AKI   Cr elevated to 1.32 from baseline ~0.8. Urine lytes most consistent with prerenal etiology, 2/2 shock versus HRS.   - Management of contributing etiologies as above  - q8h BMP   - IV albumin  25% 50g for intravascular repletion, goal I/O net neutral    Infectious Disease/Autoimmune   Undifferentiated shock, c/f septic shock - Fever (resolved) - Leukocytosis (resolved)  Presented to [facility] MICU in 2-pressor shock with leukocytosis to 24.9. UA noninfectious, CXR without pneumonia. Bcx NGTD x 24 hours. Most likely infectious source intra-abdominal. SBP considered, unfortunately ascites from paracentesis on 6/4 were not sent for diagnostic studies. No skin findings on exam to suggest SSTI. Femoral line placed at OSH possibly an infectious source, though Bcx have been NGTD. S/p vanc/zosyn . S/p tobramycin  x 1. With fever to 38.4 on 6/6. Continues now on vanc/zosyn . Paracentesis with PMNs < 250. Thoracentesis transudative. UA noninfectious. Lrcx gram stain with 1+ yeast, 1+ GPC.   - Bcx 6/4 NGTD, repeat Bcx 6/6 NGTD x 24 hrs  - Ucx negative  - Paracentesis with ANC < 250, thoracentesis transudative  - Lrcx gram stain 1+ GPC, culture pending  - Continue vanc/zosyn     Cultures:  Blood Culture, Routine (no units)   Date Value   05/06/2024 No Growth at 24 hours   05/06/2024 No Growth at 24 hours     Urine Culture, Comprehensive (no units)   Date  Value   05/04/2024 NO GROWTH     Lower Respiratory Culture (no units)   Date Value   05/06/2024 Specimen Not Processed     WBC (10*9/L)   Date Value   05/08/2024 8.5     WBC, UA (/HPF)   Date Value   05/06/2024 1          FEN/GI   Decompensated cirrhosis c/b esophageal varices, gastric varix, hepatic encephalopathy, ascites  Presenting with hematemesis 6 days after esophageal banding. Emergent TIPS at OSH not successful d/t inability to access the portal vein. EGD on 6/4 demonstrated nonbleeding esophageal ulcers at site of prior banding, stomach completely occluded by blood which could not be cleared endoscopically. With acute decline in Hb and hypotension on 6/4. Darius Edouard attempted with GI, but unsuccessful after numerous attempts. S/p salvage TIPS on 6/4. Ongoing melena and dark red NGT output with stable Hb felt to represent prior bleeding. Low clinical concern for active GI bleeding.   - S/p salvage TIPS  - Hepatology following  - Daily MELLD labs (CMP, PT/INR)  - Maintain fibrinogen > 100 with cryo  - Home lasix , spironolactone  held d/t hypotension  - Continue home lactulose , rifaximin     Acute on chronic liver failure - Ischemic hepatitis   LFTs continue to rise s/p variceal hemorrhage and TIPS. Unfortunately, precluded from transplant eval with newly reduced EF. Anticipate LFTs will stabilize with hepatic myocyte recovery from ischemic insult.   - Hepatology following     Malnutrition Assessment:   Now NPO for several days, NG tube in place. Consider starting TF when pressor requirements improved (norepi < 10).  - Nutrition consult:   Patient appropriate for fiber-free formula given risk of hypoperfusion of the GI tract and bowel ischemia due to current pressor requirements.   Current vasopressor requirements increase risk for gut hypoperfusion and bowel ischemia. Trickle rate vs holding feeds may be warranted.  Current propofol  at 4.1 ml/hr providing 101 kcals from fat - insignificant         Heme/Coag   Blood loss anemia, likely 2/2 refractory portal hypertensive bleeding (stable)  S/p 3 units pRBCs at OSH, Hb 9.3 on arrival to MICU. S/p massive transfusion protocol on 6/4. Low clinical concern for ongoing blood loss with Hb stable at 10.4 on 6/6, last transfusion 6/4.   - q8h CBC with diff    Thrombocytopenia (improving)  Plts 253k on admission, acute decline to 50k on 6/5. Currently without signs of active bleeding. Improving to plts 99k on 6/6.   - q8h CBC with diff      Endocrine   Insulin -dependent T2DM  A1c 5.6 on admission. Home regimen lantus  60, lispro with meals. Most recently on NPH 24 units BID with SSI, requiring additional 9 units overnight with BG in the 400s.   - Increase to NPH 30 units BID  - SSI  - q6h BG checks    Integumentary   Psoriasis (stable)  History of psoriasis per chart review. With numerous chronic appearing scattered erythematous plaques with silvery scale. Of note, on Secukinumab  for psoriasis management, which can increase risk for infection.    # Sacral wound, 01/2024, unclear if still present  - WOCN consulted for high risk skin assessment Yes.  - cont pressure mitigating precautions per skin policy    Prophylaxis/LDA/Restraints/Consults   ICU checklist completed: yes (see ICU rounding navigator in Epic)    Patient Lines/Drains/Airways Status       Active Active Lines, Drains, & Airways  Name Placement date Placement time Site Days    ETT  7.5 05/05/24  1810  -- 2    CVC Triple Lumen 05/05/24 Non-tunneled Left Internal jugular 05/05/24  1740  Internal jugular  2    NG/OG Tube Right nostril 05/06/24  1930  Right nostril  1    Urethral Catheter 05/04/24  0500  --  4    Peripheral IV 05/04/24 Anterior;Left;Upper Arm 05/04/24  0500  Arm  4    Peripheral IV 05/04/24 Anterior;Right;Upper Arm 05/04/24  0500  Arm  4    Peripheral IV 05/06/24 Right Arm 05/06/24  1716  Arm  1    Arterial Line 05/06/24 Right Radial 05/06/24  1716  Radial  1                  Patient Lines/Drains/Airways Status       Active Wounds       Name Placement date Placement time Site Days    Surgical Site 05/04/24 Abdomen Lower;Right 05/04/24  2300  -- 3    Surgical Site 05/04/24 Neck Right 05/04/24  2300  -- 3    Surgical Site 05/04/24 Groin Right 05/04/24  2300  -- 3                    Goals of Care     Code Status: Full Code    Designated Healthcare Decision Maker:  Ms. Batty current decisional capacity for healthcare decision-making is Full capacity. Her designated healthcare decision maker(s) is/are   HCDM (patient stated preference): Caudle,Ryan - Relative - [phone].      Subjective     Patient lying in bed. Not responsive to voice, does not withdraw to pain.     Objective     Vitals - past 24 hours  Temp:  [36.3 ??C (97.4 ??F)-37.7 ??C (99.9 ??F)] 37.7 ??C (99.8 ??F)  Pulse:  [80-95] 83  SpO2 Pulse:  [80-94] 81  Resp:  [10-25] 20  FiO2 (%):  [35 %-40 %] 35 %  SpO2:  [91 %-99 %] 96 % Intake/Output  I/O last 3 completed shifts:  In: 3869.6 [I.V.:2456.7; Blood:206.3; NG/GT:310; IV Piggyback:896.7]  Out: 2435 [Urine:2435]     Physical Exam:    General: Ill-appearing female, lying in bed. Intubated and sedated.   HEENT: Mucous membranes dry.  CV: Tachycardic. Regular rhythm.   Pulm: CTAB on anterior exam.  GI: Abd soft, nontender.  Extremities: Pulses weak but palpable. 1+ pitting edema to bilateral lower extremities.   Skin: Diffuse scattered erythematous plaques with silvery scale. No bruising, petechiae, mucocutaneous bleeding.  Neuro: Intubated and sedated. Does not respond to voice, touch, or withdraw to pain.    Continuous Infusions:    amiodarone  0.5 mg/min (05/08/24 0400)    fentaNYL  citrate (PF) 50 mcg/mL infusion 100 mcg/hr (05/08/24 0400)    NORepinephrine  bitartrate-NS 15 mcg/min (05/08/24 0400)    propofol  10 mg/mL infusion 20 mcg/kg/min (05/08/24 0400)    vasopressin  0.03 Units/min (05/08/24 0555)       Scheduled Medications:    hydrocortisone  sod succ  50 mg Intravenous Q6H    insulin  lispro  0-20 Units Subcutaneous Q6H    insulin  NPH  30 Units Subcutaneous Q12H [facility]    lactulose   20 g Enteral tube: gastric QID    levETIRAcetam   750 mg Intravenous Q12H [facility]    pantoprazole  (Protonix ) intravenous solution  40 mg Intravenous BID    piperacillin -tazobactam  4.5 g Intravenous Q8H [facility]  rifAXIMin   550 mg Enteral tube: gastric BID    sodium chloride   10 mL Intravenous Q8H    sodium chloride   10 mL Intravenous Q8H    sodium chloride   10 mL Intravenous Q8H    Vancomycin  - Pharmacy dosing by levels   Other Pharmacy dosing       PRN medications:  dextrose  in water , fentaNYL  (PF) **OR** fentaNYL  (PF), glucagon, glucose, midazolam , phenylephrine     Data/Imaging Review: Reviewed in Epic and personally interpreted on 05/08/2024. See EMR for detailed results.    Vallery Gavel, MS4  Macarthur Savory,  MD

## 2024-05-08 NOTE — Unmapped (Signed)
 Pt remains intubated and on the vent through the night. No changes made; Airway patent and secure. Oral care and suctioning provided as needed. No SBT completed due to pressor requirements. Emergency airway supplies at bedside. Will continue to monitor.     Problem: Mechanical Ventilation Invasive  Goal: Effective Communication  Outcome: Ongoing - Unchanged  Goal: Optimal Device Function  Outcome: Ongoing - Unchanged  Intervention: Optimize Device Care and Function  Recent Flowsheet Documentation  Taken 05/08/2024 0224 by Melvia Stacks, RRT  Airway/Ventilation Management:   airway patency maintained   humidification applied  Goal: Mechanical Ventilation Liberation  Outcome: Ongoing - Unchanged  Goal: Absence of Device-Related Skin and Tissue Injury  Outcome: Ongoing - Unchanged  Goal: Absence of Ventilator-Induced Lung Injury  Outcome: Ongoing - Unchanged  Intervention: Prevent Ventilator-Associated Pneumonia  Recent Flowsheet Documentation  Taken 05/08/2024 0224 by Melvia Stacks, RRT  Head of Bed [facility]) Positioning: HOB at 30-45 degrees  VAP Prevention Bundle:   HOB elevation maintained   oral care regularly provided   vent circuit breaks minimized  Taken 05/07/2024 2115 by Melvia Stacks, RRT  Head of Bed [facility]) Positioning: HOB at 30-45 degrees  VAP Prevention Bundle:   HOB elevation maintained   oral care regularly provided     Problem: Artificial Airway  Goal: Effective Communication  Outcome: Ongoing - Unchanged  Goal: Optimal Device Function  Outcome: Ongoing - Unchanged  Intervention: Optimize Device Care and Function  Recent Flowsheet Documentation  Taken 05/08/2024 0224 by Melvia Stacks, RRT  Airway/Ventilation Management:   airway patency maintained   humidification applied  Goal: Absence of Device-Related Skin or Tissue Injury  Outcome: Ongoing - Unchanged

## 2024-05-08 NOTE — Unmapped (Signed)
 Problem: Adult Inpatient Plan of Care  Goal: Plan of Care Review  Outcome: Ongoing - Unchanged  Goal: Patient-Specific Goal (Individualized)  Outcome: Ongoing - Unchanged  Goal: Absence of Hospital-Acquired Illness or Injury  Outcome: Ongoing - Unchanged  Intervention: Identify and Manage Fall Risk  Recent Flowsheet Documentation  Taken 05/08/2024 0800 by Ashley Dancer, RN  Safety Interventions:   aspiration precautions   bleeding precautions   low bed  Intervention: Prevent Skin Injury  Recent Flowsheet Documentation  Taken 05/08/2024 1800 by Ashley Dancer, RN  Positioning for Skin: Left  Taken 05/08/2024 1600 by Ashley Dancer, RN  Positioning for Skin: Right  Taken 05/08/2024 1400 by Ashley Dancer, RN  Positioning for Skin: Left  Taken 05/08/2024 1200 by Ashley Dancer, RN  Positioning for Skin: Right  Taken 05/08/2024 1000 by Ashley Dancer, RN  Positioning for Skin: Left  Taken 05/08/2024 0800 by Ashley Dancer, RN  Positioning for Skin: Right  Device Skin Pressure Protection:   absorbent pad utilized/changed   pressure points protected   tubing/devices free from skin contact  Skin Protection: mittens applied to hands  Goal: Optimal Comfort and Wellbeing  Outcome: Ongoing - Unchanged  Goal: Readiness for Transition of Care  Outcome: Ongoing - Unchanged  Goal: Rounds/Family Conference  Outcome: Ongoing - Unchanged     Problem: Skin Injury Risk Increased  Goal: Skin Health and Integrity  Outcome: Ongoing - Unchanged  Intervention: Optimize Skin Protection  Recent Flowsheet Documentation  Taken 05/08/2024 1600 by Ashley Dancer, RN  Head of Bed [facility]) Positioning: HOB at 30 degrees  Taken 05/08/2024 1200 by Ashley Dancer, RN  Head of Bed [facility]) Positioning: HOB at 30 degrees  Taken 05/08/2024 0800 by Ashley Dancer, RN  Pressure Reduction Techniques:   heels elevated off bed   pressure points protected  Head of Bed (HOB) Positioning: HOB at 30 degrees  Pressure Reduction Devices:   positioning supports utilized   heel offloading device utilized   pressure-redistributing mattress utilized  Skin Protection: mittens applied to hands     Problem: Fall Injury Risk  Goal: Absence of Fall and Fall-Related Injury  Outcome: Ongoing - Unchanged  Intervention: Promote Scientist, clinical (histocompatibility and immunogenetics) Documentation  Taken 05/08/2024 0800 by Ashley Dancer, RN  Safety Interventions:   aspiration precautions   bleeding precautions   low bed     Problem: Non-Violent Restraints  Goal: Patient will remain free of restraint events  Outcome: Ongoing - Unchanged  Goal: Patient will remain free of physical injury  Outcome: Ongoing - Unchanged     Problem: Wound  Goal: Optimal Coping  Outcome: Ongoing - Unchanged  Goal: Optimal Functional Ability  Outcome: Ongoing - Unchanged  Goal: Absence of Infection Signs and Symptoms  Outcome: Ongoing - Unchanged  Goal: Improved Oral Intake  Outcome: Ongoing - Unchanged  Goal: Optimal Pain Control and Function  Outcome: Ongoing - Unchanged  Goal: Skin Health and Integrity  Outcome: Ongoing - Unchanged  Intervention: Optimize Skin Protection  Recent Flowsheet Documentation  Taken 05/08/2024 1600 by Ashley Dancer, RN  Head of Bed [facility]) Positioning: HOB at 30 degrees  Taken 05/08/2024 1200 by Ashley Dancer, RN  Head of Bed [facility]) Positioning: HOB at 30 degrees  Taken 05/08/2024 0800 by Ashley Dancer, RN  Pressure Reduction Techniques:   heels elevated off bed   pressure points protected  Head of Bed (HOB) Positioning: HOB at 30 degrees  Pressure  Reduction Devices:   positioning supports utilized   heel offloading device utilized   pressure-redistributing mattress utilized  Skin Protection: mittens applied to hands  Goal: Optimal Wound Healing  Outcome: Ongoing - Unchanged     Problem: Self-Care Deficit  Goal: Improved Ability to Complete Activities of Daily Living  Outcome: Ongoing - Unchanged     Problem: Mechanical Ventilation Invasive  Goal: Effective Communication  Outcome: Ongoing - Unchanged  Goal: Optimal Device Function  Outcome: Ongoing - Unchanged  Intervention: Optimize Device Care and Function  Recent Flowsheet Documentation  Taken 05/08/2024 1800 by Ashley Dancer, RN  Oral Care:   lip/mouth moisturizer applied   suction provided   mouth swabbed  Taken 05/08/2024 1600 by Ashley Dancer, RN  Oral Care:   lip/mouth moisturizer applied   mouth swabbed  Taken 05/08/2024 1343 by Ashley Dancer, RN  Oral Care:   lip/mouth moisturizer applied   suction provided   mouth swabbed  Taken 05/08/2024 1200 by Ashley Dancer, RN  Oral Care:   lip/mouth moisturizer applied   suction provided   mouth swabbed  Taken 05/08/2024 1000 by Ashley Dancer, RN  Oral Care:   lip/mouth moisturizer applied   suction provided   mouth swabbed  Taken 05/08/2024 0800 by Ashley Dancer, RN  Oral Care:   lip/mouth moisturizer applied   suction provided   mouth swabbed  Goal: Mechanical Ventilation Liberation  Outcome: Ongoing - Unchanged  Goal: Optimal Nutrition Delivery  Outcome: Ongoing - Unchanged  Goal: Absence of Device-Related Skin and Tissue Injury  Outcome: Ongoing - Unchanged  Intervention: Maintain Skin and Tissue Health  Recent Flowsheet Documentation  Taken 05/08/2024 0800 by Ashley Dancer, RN  Device Skin Pressure Protection:   absorbent pad utilized/changed   pressure points protected   tubing/devices free from skin contact  Goal: Absence of Ventilator-Induced Lung Injury  Outcome: Ongoing - Unchanged  Intervention: Prevent Ventilator-Associated Pneumonia  Recent Flowsheet Documentation  Taken 05/08/2024 1800 by Ashley Dancer, RN  Oral Care:   lip/mouth moisturizer applied   suction provided   mouth swabbed  Taken 05/08/2024 1600 by Ashley Dancer, RN  Head of Bed [facility]) Positioning: HOB at 30 degrees  Oral Care:   lip/mouth moisturizer applied   mouth swabbed  Taken 05/08/2024 1343 by Ashley Dancer, RN  Oral Care:   lip/mouth moisturizer applied   suction provided   mouth swabbed  Taken 05/08/2024 1200 by Ashley Dancer, RN  Head of Bed [facility]) Positioning: HOB at 30 degrees  Oral Care:   lip/mouth moisturizer applied   suction provided   mouth swabbed  Taken 05/08/2024 1000 by Ashley Dancer, RN  Oral Care:   lip/mouth moisturizer applied   suction provided   mouth swabbed  Taken 05/08/2024 0800 by Ashley Dancer, RN  Head of Bed [facility]) Positioning: HOB at 30 degrees  Oral Care:   lip/mouth moisturizer applied   suction provided   mouth swabbed     Problem: Artificial Airway  Goal: Effective Communication  Outcome: Ongoing - Unchanged  Goal: Optimal Device Function  Outcome: Ongoing - Unchanged  Intervention: Optimize Device Care and Function  Recent Flowsheet Documentation  Taken 05/08/2024 1800 by Ashley Dancer, RN  Oral Care:   lip/mouth moisturizer applied   suction provided   mouth swabbed  Taken 05/08/2024 1600 by Ashley Dancer, RN  Oral Care:   lip/mouth moisturizer applied   mouth swabbed  Taken 05/08/2024  1343 by Ashley Dancer, RN  Oral Care:   lip/mouth moisturizer applied   suction provided   mouth swabbed  Taken 05/08/2024 1200 by Ashley Dancer, RN  Oral Care:   lip/mouth moisturizer applied   suction provided   mouth swabbed  Taken 05/08/2024 1000 by Ashley Dancer, RN  Oral Care:   lip/mouth moisturizer applied   suction provided   mouth swabbed  Taken 05/08/2024 0800 by Ashley Dancer, RN  Aspiration Precautions:   respiratory status monitored   tube feeding placement verified   upright posture maintained  Oral Care:   lip/mouth moisturizer applied   suction provided   mouth swabbed  Goal: Absence of Device-Related Skin or Tissue Injury  Outcome: Ongoing - Unchanged  Intervention: Maintain Skin and Tissue Health  Recent Flowsheet Documentation  Taken 05/08/2024 0800 by Ashley Dancer, RN  Device Skin Pressure Protection:   absorbent pad utilized/changed   pressure points protected   tubing/devices free from skin contact

## 2024-05-08 NOTE — Unmapped (Incomplete)
 Patient remains sedated, VSS on   Problem: Adult Inpatient Plan of Care  Goal: Plan of Care Review  Outcome: Ongoing - Unchanged  Goal: Patient-Specific Goal (Individualized)  Outcome: Ongoing - Unchanged  Goal: Absence of Hospital-Acquired Illness or Injury  Outcome: Ongoing - Unchanged  Intervention: Identify and Manage Fall Risk  Recent Flowsheet Documentation  Taken 05/08/2024 0000 by Brookie Cantor, RN  Safety Interventions:   aspiration precautions   bleeding precautions   fall reduction program maintained   lighting adjusted for tasks/safety   low bed  Intervention: Prevent Skin Injury  Recent Flowsheet Documentation  Taken 05/08/2024 0400 by Brookie Cantor, RN  Positioning for Skin: Right  Taken 05/08/2024 0200 by Brookie Cantor, RN  Positioning for Skin: Left  Taken 05/08/2024 0000 by Brookie Cantor, RN  Positioning for Skin: Right  Skin Protection: adhesive use limited  Goal: Optimal Comfort and Wellbeing  Outcome: Ongoing - Unchanged  Goal: Readiness for Transition of Care  Outcome: Ongoing - Unchanged  Goal: Rounds/Family Conference  Outcome: Ongoing - Unchanged     Problem: Skin Injury Risk Increased  Goal: Skin Health and Integrity  Outcome: Ongoing - Unchanged  Intervention: Optimize Skin Protection  Recent Flowsheet Documentation  Taken 05/08/2024 0400 by Brookie Cantor, RN  Activity Management: bedrest  Head of Bed [facility]) Positioning: HOB at 30 degrees  Taken 05/08/2024 0200 by Brookie Cantor, RN  Activity Management: bedrest  Head of Bed [facility]) Positioning: HOB at 30 degrees  Taken 05/08/2024 0000 by Brookie Cantor, RN  Activity Management: bedrest  Pressure Reduction Techniques: weight shift assistance provided  Head of Bed (HOB) Positioning: HOB at 30-45 degrees  Pressure Reduction Devices: pressure-redistributing mattress utilized  Skin Protection: adhesive use limited     Problem: Fall Injury Risk  Goal: Absence of Fall and Fall-Related Injury  Outcome: Ongoing - Unchanged  Intervention: Promote Scientist, clinical (histocompatibility and immunogenetics) Documentation  Taken 05/08/2024 0000 by Brookie Cantor, RN  Safety Interventions:   aspiration precautions   bleeding precautions   fall reduction program maintained   lighting adjusted for tasks/safety   low bed     Problem: Non-Violent Restraints  Goal: Patient will remain free of restraint events  Outcome: Ongoing - Unchanged  Goal: Patient will remain free of physical injury  Outcome: Ongoing - Unchanged     Problem: Wound  Goal: Optimal Coping  Outcome: Ongoing - Unchanged  Goal: Optimal Functional Ability  Outcome: Ongoing - Unchanged  Intervention: Optimize Functional Ability  Recent Flowsheet Documentation  Taken 05/08/2024 0400 by Brookie Cantor, RN  Activity Management: bedrest  Taken 05/08/2024 0200 by Brookie Cantor, RN  Activity Management: bedrest  Taken 05/08/2024 0000 by Brookie Cantor, RN  Activity Management: bedrest  Goal: Absence of Infection Signs and Symptoms  Outcome: Ongoing - Unchanged  Goal: Improved Oral Intake  Outcome: Ongoing - Unchanged  Goal: Optimal Pain Control and Function  Outcome: Ongoing - Unchanged  Goal: Skin Health and Integrity  Outcome: Ongoing - Unchanged  Intervention: Optimize Skin Protection  Recent Flowsheet Documentation  Taken 05/08/2024 0400 by Brookie Cantor, RN  Activity Management: bedrest  Head of Bed [facility]) Positioning: HOB at 30 degrees  Taken 05/08/2024 0200 by Brookie Cantor, RN  Activity Management: bedrest  Head of Bed [facility]) Positioning: HOB at 30 degrees  Taken 05/08/2024 0000 by Brookie Cantor, RN  Activity Management: bedrest  Pressure Reduction Techniques: weight shift assistance provided  Head of Bed (HOB) Positioning: HOB at 30-45 degrees  Pressure Reduction Devices: pressure-redistributing mattress  utilized  Skin Protection: adhesive use limited  Goal: Optimal Wound Healing  Outcome: Ongoing - Unchanged     Problem: Self-Care Deficit  Goal: Improved Ability to Complete Activities of Daily Living  Outcome: Ongoing - Unchanged     Problem: Mechanical Ventilation Invasive  Goal: Effective Communication  Outcome: Ongoing - Unchanged  Goal: Optimal Device Function  Outcome: Ongoing - Unchanged  Intervention: Optimize Device Care and Function  Recent Flowsheet Documentation  Taken 05/08/2024 0400 by Brookie Cantor, RN  Oral Care:   suction provided   teeth brushed   mouth swabbed  Taken 05/08/2024 0000 by Brookie Cantor, RN  Oral Care:   suction provided   teeth brushed   mouth swabbed  Goal: Mechanical Ventilation Liberation  Outcome: Ongoing - Unchanged  Goal: Optimal Nutrition Delivery  Outcome: Ongoing - Unchanged  Goal: Absence of Device-Related Skin and Tissue Injury  Outcome: Ongoing - Unchanged  Goal: Absence of Ventilator-Induced Lung Injury  Outcome: Ongoing - Unchanged  Intervention: Prevent Ventilator-Associated Pneumonia  Recent Flowsheet Documentation  Taken 05/08/2024 0400 by Brookie Cantor, RN  Head of Bed [facility] Positioning: HOB at 30 degrees  Oral Care:   suction provided   teeth brushed   mouth swabbed  Taken 05/08/2024 0200 by Brookie Cantor, RN  Head of Bed [facility]) Positioning: HOB at 30 degrees  Taken 05/08/2024 0000 by Brookie Cantor, RN  Head of Bed [facility]) Positioning: HOB at 30-45 degrees  Oral Care:   suction provided   teeth brushed   mouth swabbed     Problem: Artificial Airway  Goal: Effective Communication  Outcome: Ongoing - Unchanged  Goal: Optimal Device Function  Outcome: Ongoing - Unchanged  Intervention: Optimize Device Care and Function  Recent Flowsheet Documentation  Taken 05/08/2024 0400 by Brookie Cantor, RN  Oral Care:   suction provided   teeth brushed   mouth swabbed  Taken 05/08/2024 0000 by Brookie Cantor, RN  Aspiration Precautions: NPO pending swallow screening/evaluation  Oral Care:   suction provided   teeth brushed   mouth swabbed  Goal: Absence of Device-Related Skin or Tissue Injury  Outcome: Ongoing - Unchanged

## 2024-05-08 NOTE — Unmapped (Signed)
 Vancomycin  Therapeutic Monitoring Pharmacy Note    Ashley Briggs is a 58 y.o. female continuing vancomycin . Date of therapy initiation: 05/06/24    Indication: Suspected infection     Prior Dosing Information: S/p vancomycin  1000 mg x 1 (05/07/24)      Goals:  Therapeutic Drug Levels  Vancomycin  trough goal: 10-15 mg/L    Additional Clinical Monitoring/Outcomes  Renal function, volume status (intake and output)    Results: Vancomycin  level 18.6 mg/L, drawn appropriately (extrapolated trough @ 1500 - ~12.5)    Wt Readings from Last 1 Encounters:   05/04/24 88.8 kg (195 lb 12.3 oz)     Creatinine   Date Value Ref Range Status   05/08/2024 1.66 (H) 0.55 - 1.02 mg/dL Final   09/81/1914 7.82 (H) 0.55 - 1.02 mg/dL Final   95/62/1308 6.57 (H) 0.55 - 1.02 mg/dL Final        Pharmacokinetic Considerations and Significant Drug Interactions:  Per linear dose adjustment  Concurrent nephrotoxic meds: Zosyn     Assessment/Plan:  Recommendation(s)  With patient kinetics, trough at ~12hr is to be expected to be ~12.5 and re-dose will be appropriate. Will re-dose vancomycin  750 mg IV x 1. Renal function is stable - will continue to dose by level for now.    Estimated trough on recommended regimen: Not applicable - dosing by level    Follow-up  Level due: tomorrow with AM labs  A pharmacist will continue to monitor and order levels as appropriate    Please page service pharmacist with questions/clarifications.    Curry Dove, PharmD

## 2024-05-09 LAB — ENDOTOOL
ENDOTOOL GLUCOSE: 140 mg/dL (ref 140–180)
ENDOTOOL GLUCOSE: 115 mg/dL — ABNORMAL LOW (ref 140–180)
ENDOTOOL GLUCOSE: 146 mg/dL (ref 140–180)
ENDOTOOL GLUCOSE: 157 mg/dL (ref 140–180)
ENDOTOOL GLUCOSE: 141 mg/dL (ref 140–180)
ENDOTOOL GLUCOSE: 160 mg/dL (ref 140–180)
ENDOTOOL GLUCOSE: 169 mg/dL (ref 140–180)
ENDOTOOL GLUCOSE: 160 mg/dL (ref 140–180)
ENDOTOOL GLUCOSE: 157 mg/dL (ref 140–180)
ENDOTOOL GLUCOSE: 124 mg/dL — ABNORMAL LOW (ref 140–180)
ENDOTOOL GLUCOSE: 148 mg/dL (ref 140–180)
ENDOTOOL GLUCOSE: 157 mg/dL (ref 140–180)
ENDOTOOL GLUCOSE: 151 mg/dL (ref 140–180)
ENDOTOOL INSULIN RATE: 3.2 U/h
ENDOTOOL INSULIN RATE: 1.1 U/h
ENDOTOOL INSULIN RATE: 3.2 U/h
ENDOTOOL INSULIN RATE: 3.8 U/h
ENDOTOOL INSULIN RATE: 2.4 U/h
ENDOTOOL INSULIN RATE: 3.8 U/h
ENDOTOOL INSULIN RATE: 4.4 U/h
ENDOTOOL INSULIN RATE: 3.6 U/h
ENDOTOOL INSULIN RATE: 3.8 U/h
ENDOTOOL INSULIN RATE: 1.2 U/h
ENDOTOOL INSULIN RATE: 3.2 U/h
ENDOTOOL INSULIN RATE: 3.6 U/h
ENDOTOOL INSULIN RATE: 3 U/h

## 2024-05-09 LAB — CBC W/ AUTO DIFF
BASOPHILS ABSOLUTE COUNT: 0 10*9/L (ref 0.0–0.1)
BASOPHILS ABSOLUTE COUNT: 0 10*9/L (ref 0.0–0.1)
BASOPHILS ABSOLUTE COUNT: 0 10*9/L (ref 0.0–0.1)
BASOPHILS RELATIVE PERCENT: 0.1 %
BASOPHILS RELATIVE PERCENT: 0.1 %
BASOPHILS RELATIVE PERCENT: 0.3 %
EOSINOPHILS ABSOLUTE COUNT: 0 10*9/L (ref 0.0–0.5)
EOSINOPHILS ABSOLUTE COUNT: 0 10*9/L (ref 0.0–0.5)
EOSINOPHILS ABSOLUTE COUNT: 0 10*9/L (ref 0.0–0.5)
EOSINOPHILS RELATIVE PERCENT: 0 %
EOSINOPHILS RELATIVE PERCENT: 0 %
EOSINOPHILS RELATIVE PERCENT: 0 %
HEMATOCRIT: 27.5 % — ABNORMAL LOW (ref 34.0–44.0)
HEMATOCRIT: 28.6 % — ABNORMAL LOW (ref 34.0–44.0)
HEMATOCRIT: 28.2 % — ABNORMAL LOW (ref 34.0–44.0)
HEMOGLOBIN: 9.5 g/dL — ABNORMAL LOW (ref 11.3–14.9)
HEMOGLOBIN: 9.6 g/dL — ABNORMAL LOW (ref 11.3–14.9)
HEMOGLOBIN: 9.4 g/dL — ABNORMAL LOW (ref 11.3–14.9)
LYMPHOCYTES ABSOLUTE COUNT: 0.5 10*9/L — ABNORMAL LOW (ref 1.1–3.6)
LYMPHOCYTES ABSOLUTE COUNT: 0.4 10*9/L — ABNORMAL LOW (ref 1.1–3.6)
LYMPHOCYTES ABSOLUTE COUNT: 0.6 10*9/L — ABNORMAL LOW (ref 1.1–3.6)
LYMPHOCYTES RELATIVE PERCENT: 4.8 %
LYMPHOCYTES RELATIVE PERCENT: 4.5 %
LYMPHOCYTES RELATIVE PERCENT: 6.5 %
MEAN CORPUSCULAR HEMOGLOBIN CONC: 34.6 g/dL (ref 32.0–36.0)
MEAN CORPUSCULAR HEMOGLOBIN CONC: 33.7 g/dL (ref 32.0–36.0)
MEAN CORPUSCULAR HEMOGLOBIN CONC: 33.3 g/dL (ref 32.0–36.0)
MEAN CORPUSCULAR HEMOGLOBIN: 29.9 pg (ref 25.9–32.4)
MEAN CORPUSCULAR HEMOGLOBIN: 29.4 pg (ref 25.9–32.4)
MEAN CORPUSCULAR HEMOGLOBIN: 29.5 pg (ref 25.9–32.4)
MEAN CORPUSCULAR VOLUME: 86.3 fL (ref 77.6–95.7)
MEAN CORPUSCULAR VOLUME: 87.1 fL (ref 77.6–95.7)
MEAN CORPUSCULAR VOLUME: 88.5 fL (ref 77.6–95.7)
MEAN PLATELET VOLUME: 7.9 fL (ref 6.8–10.7)
MEAN PLATELET VOLUME: 7.7 fL (ref 6.8–10.7)
MEAN PLATELET VOLUME: 7.9 fL (ref 6.8–10.7)
MONOCYTES ABSOLUTE COUNT: 0.9 10*9/L — ABNORMAL HIGH (ref 0.3–0.8)
MONOCYTES ABSOLUTE COUNT: 1.2 10*9/L — ABNORMAL HIGH (ref 0.3–0.8)
MONOCYTES ABSOLUTE COUNT: 1.3 10*9/L — ABNORMAL HIGH (ref 0.3–0.8)
MONOCYTES RELATIVE PERCENT: 8.7 %
MONOCYTES RELATIVE PERCENT: 13.9 %
MONOCYTES RELATIVE PERCENT: 15 %
NEUTROPHILS ABSOLUTE COUNT: 8.7 10*9/L — ABNORMAL HIGH (ref 1.8–7.8)
NEUTROPHILS ABSOLUTE COUNT: 7.2 10*9/L (ref 1.8–7.8)
NEUTROPHILS ABSOLUTE COUNT: 6.7 10*9/L (ref 1.8–7.8)
NEUTROPHILS RELATIVE PERCENT: 86.4 %
NEUTROPHILS RELATIVE PERCENT: 81.5 %
NEUTROPHILS RELATIVE PERCENT: 78.2 %
PLATELET COUNT: 103 10*9/L — ABNORMAL LOW (ref 150–450)
PLATELET COUNT: 106 10*9/L — ABNORMAL LOW (ref 150–450)
PLATELET COUNT: 84 10*9/L — ABNORMAL LOW (ref 150–450)
RED BLOOD CELL COUNT: 3.18 10*12/L — ABNORMAL LOW (ref 3.95–5.13)
RED BLOOD CELL COUNT: 3.28 10*12/L — ABNORMAL LOW (ref 3.95–5.13)
RED BLOOD CELL COUNT: 3.18 10*12/L — ABNORMAL LOW (ref 3.95–5.13)
RED CELL DISTRIBUTION WIDTH: 18.3 % — ABNORMAL HIGH (ref 12.2–15.2)
RED CELL DISTRIBUTION WIDTH: 18.6 % — ABNORMAL HIGH (ref 12.2–15.2)
RED CELL DISTRIBUTION WIDTH: 18.8 % — ABNORMAL HIGH (ref 12.2–15.2)
WBC ADJUSTED: 10 10*9/L (ref 3.6–11.2)
WBC ADJUSTED: 8.9 10*9/L (ref 3.6–11.2)
WBC ADJUSTED: 8.6 10*9/L (ref 3.6–11.2)

## 2024-05-09 LAB — BLOOD GAS CRITICAL CARE PANEL, ARTERIAL
BASE EXCESS ARTERIAL: -1.3 (ref -2.0–2.0)
CALCIUM IONIZED ARTERIAL (MG/DL): 5.19 mg/dL (ref 4.40–5.40)
GLUCOSE WHOLE BLOOD: 153 mg/dL (ref 70–179)
HCO3 ARTERIAL: 23 mmol/L (ref 22–27)
HEMOGLOBIN BLOOD GAS: 9.1 g/dL — ABNORMAL LOW (ref 12.00–16.00)
LACTATE BLOOD ARTERIAL: 1.9 mmol/L — ABNORMAL HIGH (ref <1.3)
O2 SATURATION ARTERIAL: 98.2 % (ref 94.0–100.0)
PCO2 ARTERIAL: 36.2 mmHg (ref 35.0–45.0)
PH ARTERIAL: 7.41 (ref 7.35–7.45)
PO2 ARTERIAL: 95 mmHg (ref 80.0–110.0)
POTASSIUM WHOLE BLOOD: 4 mmol/L (ref 3.4–4.6)
SODIUM WHOLE BLOOD: 142 mmol/L (ref 135–145)

## 2024-05-09 LAB — PROTIME-INR
INR: 1.43
INR: 1.37
PROTIME: 16.3 s — ABNORMAL HIGH (ref 9.9–12.6)
PROTIME: 15.6 s — ABNORMAL HIGH (ref 9.9–12.6)

## 2024-05-09 LAB — COMPREHENSIVE METABOLIC PANEL
ALBUMIN: 3.6 g/dL (ref 3.4–5.0)
ALBUMIN: 3.3 g/dL — ABNORMAL LOW (ref 3.4–5.0)
ALBUMIN: 3.4 g/dL (ref 3.4–5.0)
ALKALINE PHOSPHATASE: 81 U/L (ref 46–116)
ALKALINE PHOSPHATASE: 82 U/L (ref 46–116)
ALKALINE PHOSPHATASE: 84 U/L (ref 46–116)
ALT (SGPT): 546 U/L — ABNORMAL HIGH (ref 10–49)
ALT (SGPT): 509 U/L — ABNORMAL HIGH (ref 10–49)
ALT (SGPT): 488 U/L — ABNORMAL HIGH (ref 10–49)
ANION GAP: 11 mmol/L (ref 5–14)
ANION GAP: 9 mmol/L (ref 5–14)
ANION GAP: 10 mmol/L (ref 5–14)
AST (SGOT): 1070 U/L — ABNORMAL HIGH (ref <=34)
AST (SGOT): 892 U/L — ABNORMAL HIGH (ref <=34)
AST (SGOT): 765 U/L — ABNORMAL HIGH (ref <=34)
BILIRUBIN TOTAL: 12.1 mg/dL — ABNORMAL HIGH (ref 0.3–1.2)
BILIRUBIN TOTAL: 12.2 mg/dL — ABNORMAL HIGH (ref 0.3–1.2)
BILIRUBIN TOTAL: 12.6 mg/dL — ABNORMAL HIGH (ref 0.3–1.2)
BLOOD UREA NITROGEN: 76 mg/dL — ABNORMAL HIGH (ref 9–23)
BLOOD UREA NITROGEN: 74 mg/dL — ABNORMAL HIGH (ref 9–23)
BLOOD UREA NITROGEN: 78 mg/dL — ABNORMAL HIGH (ref 9–23)
BUN / CREAT RATIO: 49
BUN / CREAT RATIO: 50
BUN / CREAT RATIO: 49
CALCIUM: 9.9 mg/dL (ref 8.7–10.4)
CALCIUM: 9.5 mg/dL (ref 8.7–10.4)
CALCIUM: 10 mg/dL (ref 8.7–10.4)
CHLORIDE: 106 mmol/L (ref 98–107)
CHLORIDE: 108 mmol/L — ABNORMAL HIGH (ref 98–107)
CHLORIDE: 106 mmol/L (ref 98–107)
CO2: 23 mmol/L (ref 20.0–31.0)
CO2: 25 mmol/L (ref 20.0–31.0)
CO2: 26 mmol/L (ref 20.0–31.0)
CREATININE: 1.54 mg/dL — ABNORMAL HIGH (ref 0.55–1.02)
CREATININE: 1.48 mg/dL — ABNORMAL HIGH (ref 0.55–1.02)
CREATININE: 1.58 mg/dL — ABNORMAL HIGH (ref 0.55–1.02)
EGFR CKD-EPI (2021) FEMALE: 39 mL/min/1.73m2 — ABNORMAL LOW (ref >=60)
EGFR CKD-EPI (2021) FEMALE: 41 mL/min/1.73m2 — ABNORMAL LOW (ref >=60)
EGFR CKD-EPI (2021) FEMALE: 38 mL/min/1.73m2 — ABNORMAL LOW (ref >=60)
GLUCOSE RANDOM: 158 mg/dL — ABNORMAL HIGH (ref 70–99)
GLUCOSE RANDOM: 155 mg/dL (ref 70–179)
GLUCOSE RANDOM: 159 mg/dL (ref 70–179)
POTASSIUM: 4.2 mmol/L (ref 3.4–4.8)
POTASSIUM: 3.9 mmol/L (ref 3.4–4.8)
POTASSIUM: 4.1 mmol/L (ref 3.4–4.8)
PROTEIN TOTAL: 6 g/dL (ref 5.7–8.2)
PROTEIN TOTAL: 6.1 g/dL (ref 5.7–8.2)
PROTEIN TOTAL: 6.3 g/dL (ref 5.7–8.2)
SODIUM: 140 mmol/L (ref 135–145)
SODIUM: 142 mmol/L (ref 135–145)
SODIUM: 142 mmol/L (ref 135–145)

## 2024-05-09 LAB — VANCOMYCIN, RANDOM: VANCOMYCIN RANDOM: 19 ug/mL

## 2024-05-09 LAB — FIBRINOGEN: FIBRINOGEN LEVEL: 126 mg/dL — ABNORMAL LOW (ref 175–500)

## 2024-05-09 LAB — MAGNESIUM
MAGNESIUM: 2.5 mg/dL (ref 1.6–2.6)
MAGNESIUM: 2.5 mg/dL (ref 1.6–2.6)
MAGNESIUM: 2.7 mg/dL — ABNORMAL HIGH (ref 1.6–2.6)

## 2024-05-09 MED ADMIN — hydrocortisone sod succ (Solu-CORTEF) injection 50 mg: 50 mg | INTRAVENOUS | @ 09:00:00 | Stop: 2024-05-09

## 2024-05-09 MED ADMIN — fentaNYL PF (SUBLIMAZE) (50 mcg/mL) infusion (bag): 0-200 ug/h | INTRAVENOUS | @ 06:00:00 | Stop: 2024-05-09

## 2024-05-09 MED ADMIN — levETIRAcetam (KEPPRA) injection 750 mg: 750 mg | INTRAVENOUS | @ 01:00:00

## 2024-05-09 MED ADMIN — NORepinephrine 8 mg in dextrose 5 % 250 mL (32 mcg/mL) infusion PMB: 0-40 ug/min | INTRAVENOUS | @ 01:00:00

## 2024-05-09 MED ADMIN — sodium chloride (NS) 0.9 % flush 10 mL: 10 mL | INTRAVENOUS

## 2024-05-09 MED ADMIN — sodium chloride (NS) 0.9 % flush 10 mL: 10 mL | INTRAVENOUS | @ 07:00:00

## 2024-05-09 MED ADMIN — piperacillin-tazobactam (ZOSYN) IVPB (premix) 4.5 g: 4.5 g | INTRAVENOUS | @ 10:00:00 | Stop: 2024-05-09

## 2024-05-09 MED ADMIN — lactated ringers bolus 500 mL: 500 mL | INTRAVENOUS | @ 06:00:00 | Stop: 2024-05-09

## 2024-05-09 MED ADMIN — lactulose oral solution: 20 g | GASTROENTERAL | @ 21:00:00

## 2024-05-09 MED ADMIN — lactulose oral solution: 20 g | GASTROENTERAL | @ 01:00:00

## 2024-05-09 MED ADMIN — levETIRAcetam (KEPPRA) injection 750 mg: 750 mg | INTRAVENOUS | @ 14:00:00

## 2024-05-09 MED ADMIN — pantoprazole (Protonix) injection 40 mg: 40 mg | INTRAVENOUS | @ 01:00:00

## 2024-05-09 MED ADMIN — potassium chloride 20 mEq in 100 mL IVPB Premix: 20 meq | INTRAVENOUS | @ 05:00:00 | Stop: 2024-05-09

## 2024-05-09 MED ADMIN — propofol (DIPRIVAN) infusion 10 mg/mL: 0-30 ug/kg/min | INTRAVENOUS | @ 05:00:00 | Stop: 2024-05-09

## 2024-05-09 MED ADMIN — NORepinephrine 8 mg in dextrose 5 % 250 mL (32 mcg/mL) infusion PMB: 0-40 ug/min | INTRAVENOUS | @ 10:00:00 | Stop: 2024-05-09

## 2024-05-09 MED ADMIN — sodium chloride (NS) 0.9 % flush 10 mL: 10 mL | INTRAVENOUS | @ 16:00:00

## 2024-05-09 MED ADMIN — amiodarone 360 mg in dextrose 5% 200 mL (1.8 mg/mL) infusion PMB: .5 mg/min | INTRAVENOUS | @ 05:00:00

## 2024-05-09 MED ADMIN — amiodarone 360 mg in dextrose 5% 200 mL (1.8 mg/mL) infusion PMB: .5 mg/min | INTRAVENOUS | @ 18:00:00

## 2024-05-09 MED ADMIN — lactulose oral solution: 20 g | GASTROENTERAL | @ 15:00:00

## 2024-05-09 MED ADMIN — hydrocortisone sod succ (Solu-CORTEF) injection 50 mg: 50 mg | INTRAVENOUS | @ 01:00:00

## 2024-05-09 MED ADMIN — heparin (porcine) 5,000 unit/mL injection 5,000 Units: 5000 [IU] | SUBCUTANEOUS | @ 14:00:00

## 2024-05-09 MED ADMIN — pantoprazole (Protonix) injection 40 mg: 40 mg | INTRAVENOUS | @ 14:00:00

## 2024-05-09 MED ADMIN — lactulose oral solution: 20 g | GASTROENTERAL | @ 10:00:00

## 2024-05-09 MED ADMIN — potassium chloride 20 mEq in 100 mL IVPB Premix: 20 meq | INTRAVENOUS | @ 06:00:00 | Stop: 2024-05-09

## 2024-05-09 MED ADMIN — rifAXIMin (XIFAXAN) oral suspension: 550 mg | GASTROENTERAL | @ 14:00:00 | Stop: 2024-05-10

## 2024-05-09 MED ADMIN — insulin NPH (HumuLIN,NovoLIN) injection 20 Units: 20 [IU] | SUBCUTANEOUS

## 2024-05-09 MED ADMIN — rifAXIMin (XIFAXAN) oral suspension: 550 mg | GASTROENTERAL | @ 01:00:00 | Stop: 2024-05-10

## 2024-05-09 MED ADMIN — piperacillin-tazobactam (ZOSYN) IVPB (premix) 4.5 g: 4.5 g | INTRAVENOUS | @ 01:00:00 | Stop: 2024-05-11

## 2024-05-09 NOTE — Unmapped (Signed)
 Sedation vacation done, pts opening eye to speech. Levo, amio, insulin  gtt contd. Repositioned q2. Safety maintained. CHG bath done.  Problem: Fall Injury Risk  Goal: Absence of Fall and Fall-Related Injury  Outcome: Ongoing - Unchanged  Intervention: Promote Scientist, clinical (histocompatibility and immunogenetics) Documentation  Taken 05/09/2024 1200 by Ashley Footman, RN  Safety Interventions: aspiration precautions  Taken 05/09/2024 0800 by Ashley Footman, RN  Safety Interventions:   aspiration precautions   bed alarm     Problem: Mechanical Ventilation Invasive  Goal: Absence of Device-Related Skin and Tissue Injury  Outcome: Progressing  Intervention: Maintain Skin and Tissue Health  Recent Flowsheet Documentation  Taken 05/09/2024 0800 by Ashley Footman, RN  Device Skin Pressure Protection: absorbent pad utilized/changed     Problem: Adult Inpatient Plan of Care  Goal: Absence of Hospital-Acquired Illness or Injury  Intervention: Identify and Manage Fall Risk  Recent Flowsheet Documentation  Taken 05/09/2024 1200 by Ashley Footman, RN  Safety Interventions: aspiration precautions  Taken 05/09/2024 0800 by Ashley Footman, RN  Safety Interventions:   aspiration precautions   bed alarm  Intervention: Prevent Skin Injury  Recent Flowsheet Documentation  Taken 05/09/2024 1000 by Ashley Footman, RN  Positioning for Skin: Left  Taken 05/09/2024 0800 by Ashley Footman, RN  Positioning for Skin: Right  Device Skin Pressure Protection: absorbent pad utilized/changed  Skin Protection: adhesive use limited     Problem: Skin Injury Risk Increased  Goal: Skin Health and Integrity  Intervention: Optimize Skin Protection  Recent Flowsheet Documentation  Taken 05/09/2024 1200 by Ashley Footman, RN  Head of Bed [facility]) Positioning: HOB at 30-45 degrees  Taken 05/09/2024 1000 by Ashley Footman, RN  Head of Bed [facility]) Positioning: HOB at 30-45 degrees  Taken 05/09/2024 0800 by Ashley Footman, RN  Pressure Reduction Techniques: heels elevated off bed  Head of Bed (HOB) Positioning: HOB at 30-45 degrees  Pressure Reduction Devices: specialty bed utilized  Skin Protection: adhesive use limited     Problem: Wound  Goal: Skin Health and Integrity  Intervention: Optimize Skin Protection  Recent Flowsheet Documentation  Taken 05/09/2024 1200 by Ashley Footman, RN  Head of Bed [facility]) Positioning: HOB at 30-45 degrees  Taken 05/09/2024 1000 by Ashley Footman, RN  Head of Bed [facility]) Positioning: HOB at 30-45 degrees  Taken 05/09/2024 0800 by Ashley Footman, RN  Pressure Reduction Techniques: heels elevated off bed  Head of Bed (HOB) Positioning: HOB at 30-45 degrees  Pressure Reduction Devices: specialty bed utilized  Skin Protection: adhesive use limited     Problem: Mechanical Ventilation Invasive  Goal: Optimal Device Function  Intervention: Optimize Device Care and Function  Recent Flowsheet Documentation  Taken 05/09/2024 1200 by Ashley Footman, RN  Oral Care:   lip/mouth moisturizer applied   suction provided   mouth swabbed  Taken 05/09/2024 0800 by Ashley Footman, RN  Airway/Ventilation Management: airway patency maintained  Oral Care:   suction provided   mouth swabbed   lip/mouth moisturizer applied  Goal: Absence of Ventilator-Induced Lung Injury  Intervention: Prevent Ventilator-Associated Pneumonia  Recent Flowsheet Documentation  Taken 05/09/2024 1200 by Ashley Footman, RN  Head of Bed [facility]) Positioning: HOB at 30-45 degrees  Oral Care:   lip/mouth moisturizer applied   suction provided   mouth swabbed  Taken 05/09/2024 1000 by Ashley Footman, RN  Head of Bed [facility]) Positioning: HOB at 30-45 degrees  Taken 05/09/2024 0800 by Ashley Footman, RN  Head of Bed (HOB) Positioning: HOB at 30-45 degrees  Oral Care:  suction provided   mouth swabbed   lip/mouth moisturizer applied     Problem: Artificial Airway  Goal: Optimal Device Function  Intervention: Optimize Device Care and Function  Recent Flowsheet Documentation  Taken 05/09/2024 1200 by Ashley Footman, RN  Aspiration Precautions: oral hygiene care promoted  Oral Care:   lip/mouth moisturizer applied   suction provided   mouth swabbed  Taken 05/09/2024 0800 by Ashley Footman, RN  Airway/Ventilation Management: airway patency maintained  Aspiration Precautions:   oral hygiene care promoted   tube feeding placement verified  Oral Care:   suction provided   mouth swabbed   lip/mouth moisturizer applied  Goal: Absence of Device-Related Skin or Tissue Injury  Intervention: Maintain Skin and Tissue Health  Recent Flowsheet Documentation  Taken 05/09/2024 0800 by Ashley Footman, RN  Device Skin Pressure Protection: absorbent pad utilized/changed

## 2024-05-09 NOTE — Unmapped (Signed)
 Pt remains on the vent & is sedated. Titraiting levo to maintain MAP goal of 70. Received 500cc bolus. Decreased UO. No BM this shift. Q2 turns.     Problem: Adult Inpatient Plan of Care  Goal: Plan of Care Review  Outcome: Ongoing - Unchanged  Goal: Patient-Specific Goal (Individualized)  Outcome: Ongoing - Unchanged  Goal: Absence of Hospital-Acquired Illness or Injury  Outcome: Ongoing - Unchanged  Intervention: Identify and Manage Fall Risk  Recent Flowsheet Documentation  Taken 05/08/2024 2000 by Ashley Bold, RN  Safety Interventions: aspiration precautions  Intervention: Prevent Skin Injury  Recent Flowsheet Documentation  Taken 05/09/2024 0400 by Ashley Bold, RN  Positioning for Skin: Right  Taken 05/09/2024 0200 by Ashley Bold, RN  Positioning for Skin: Left  Taken 05/09/2024 0000 by Ashley Bold, RN  Positioning for Skin: Right  Taken 05/08/2024 2200 by Ashley Bold, RN  Positioning for Skin: Left  Taken 05/08/2024 2000 by Ashley Bold, RN  Positioning for Skin: Right  Device Skin Pressure Protection:   absorbent pad utilized/changed   adhesive use limited  Skin Protection: mittens applied to hands  Goal: Optimal Comfort and Wellbeing  Outcome: Ongoing - Unchanged  Goal: Readiness for Transition of Care  Outcome: Ongoing - Unchanged  Goal: Rounds/Family Conference  Outcome: Ongoing - Unchanged     Problem: Skin Injury Risk Increased  Goal: Skin Health and Integrity  Outcome: Ongoing - Unchanged  Intervention: Optimize Skin Protection  Recent Flowsheet Documentation  Taken 05/08/2024 2000 by Ashley Bold, RN  Activity Management: bedrest  Pressure Reduction Techniques: heels elevated off bed  Pressure Reduction Devices: heel offloading device utilized  Skin Protection: mittens applied to hands     Problem: Fall Injury Risk  Goal: Absence of Fall and Fall-Related Injury  Outcome: Ongoing - Unchanged  Intervention: Promote Scientist, clinical (histocompatibility and immunogenetics) Documentation  Taken 05/08/2024 2000 by Ashley Bold, RN  Safety Interventions: aspiration precautions     Problem: Non-Violent Restraints  Goal: Patient will remain free of restraint events  Outcome: Ongoing - Unchanged  Goal: Patient will remain free of physical injury  Outcome: Ongoing - Unchanged     Problem: Wound  Goal: Optimal Coping  Outcome: Ongoing - Unchanged  Goal: Optimal Functional Ability  Outcome: Ongoing - Unchanged  Intervention: Optimize Functional Ability  Recent Flowsheet Documentation  Taken 05/08/2024 2000 by Ashley Bold, RN  Activity Management: bedrest  Goal: Absence of Infection Signs and Symptoms  Outcome: Ongoing - Unchanged  Goal: Improved Oral Intake  Outcome: Ongoing - Unchanged  Goal: Optimal Pain Control and Function  Outcome: Ongoing - Unchanged  Goal: Skin Health and Integrity  Outcome: Ongoing - Unchanged  Intervention: Optimize Skin Protection  Recent Flowsheet Documentation  Taken 05/08/2024 2000 by Carlon Chester, Dickie Cloe L, RN  Activity Management: bedrest  Pressure Reduction Techniques: heels elevated off bed  Pressure Reduction Devices: heel offloading device utilized  Skin Protection: mittens applied to hands  Goal: Optimal Wound Healing  Outcome: Ongoing - Unchanged     Problem: Self-Care Deficit  Goal: Improved Ability to Complete Activities of Daily Living  Outcome: Ongoing - Unchanged     Problem: Mechanical Ventilation Invasive  Goal: Effective Communication  Outcome: Ongoing - Unchanged  Goal: Optimal Device Function  Outcome: Ongoing - Unchanged  Intervention: Optimize Device Care and Function  Recent Flowsheet Documentation  Taken 05/09/2024 0400 by Ashley Bold, RN  Oral Care:   suction provided   oral  rinse provided   mouth swabbed  Taken 05/09/2024 0200 by Ashley Bold, RN  Oral Care:   mouth swabbed   oral rinse provided   suction provided   teeth brushed  Taken 05/08/2024 2000 by Sonnie Dusky L, RN  Oral Care:   oral rinse provided   suction provided   teeth brushed   mouth swabbed  Goal: Mechanical Ventilation Liberation  Outcome: Ongoing - Unchanged  Goal: Optimal Nutrition Delivery  Outcome: Ongoing - Unchanged  Goal: Absence of Device-Related Skin and Tissue Injury  Outcome: Ongoing - Unchanged  Intervention: Maintain Skin and Tissue Health  Recent Flowsheet Documentation  Taken 05/08/2024 2000 by Ashley Bold, RN  Device Skin Pressure Protection:   absorbent pad utilized/changed   adhesive use limited  Goal: Absence of Ventilator-Induced Lung Injury  Outcome: Ongoing - Unchanged  Intervention: Prevent Ventilator-Associated Pneumonia  Recent Flowsheet Documentation  Taken 05/09/2024 0400 by Ashley Bold, RN  Oral Care:   suction provided   oral rinse provided   mouth swabbed  Taken 05/09/2024 0200 by Ashley Bold, RN  Oral Care:   mouth swabbed   oral rinse provided   suction provided   teeth brushed  Taken 05/08/2024 2000 by Ashley Bold, RN  Oral Care:   oral rinse provided   suction provided   teeth brushed   mouth swabbed     Problem: Artificial Airway  Goal: Effective Communication  Outcome: Ongoing - Unchanged  Goal: Optimal Device Function  Outcome: Ongoing - Unchanged  Intervention: Optimize Device Care and Function  Recent Flowsheet Documentation  Taken 05/09/2024 0400 by Ashley Bold, RN  Oral Care:   suction provided   oral rinse provided   mouth swabbed  Taken 05/09/2024 0200 by Ashley Bold, RN  Oral Care:   mouth swabbed   oral rinse provided   suction provided   teeth brushed  Taken 05/08/2024 2000 by Sonnie Dusky L, RN  Aspiration Precautions:   NPO pending swallow screening/evaluation   respiratory status monitored  Oral Care:   oral rinse provided   suction provided   teeth brushed   mouth swabbed  Goal: Absence of Device-Related Skin or Tissue Injury  Outcome: Ongoing - Unchanged  Intervention: Maintain Skin and Tissue Health  Recent Flowsheet Documentation  Taken 05/08/2024 2000 by Ashley Bold, RN  Device Skin Pressure Protection:   absorbent pad utilized/changed   adhesive use limited

## 2024-05-09 NOTE — Unmapped (Signed)
 DIVISION OF CARDIOLOGY  [facility] , [place]        Date of Service: 05/09/2024    CARDIOLOGY FOLLOWUP CONSULT  NOTE    Requesting Physician: Derry Flock, MD   Requesting Service: Medical ICU (MDI)   Consulting Fellow: Trinda Fuller, MD       Assessment & Recommendations     [name] is a 58 y.o. female with CAD s/p NSTEMI (reportedly no stents), CKD, decompensated MASLD cirrhosis c/b ascites, hepatic encephalopathy and EV s/p recent banding admitted with UGIB 2/2 bleeding esophageal ulcers s/p unsuccessful TIPS complicated by pulsatile MMVT s/p emergent cardioversion requiring transfer to [facility] MICU.  In hemorrhagic shock secondary to gastric bleed on admission requiring MTP now s/p TIPS. Cardiology following for arrhythmias and elevated troponin.    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 Echo with normal EF and no structural abnormalities on 6/4. Overnight with WCT and concern for worsening EF on POCUS so cardiology paged. Cardiology POCUS overnight with contractile base and more ballooned apex that appeared slightly depressed 45-50% as well as pulmonary edema and increased oxygen requirement. Troponin increased up to ~23k. Suspect stress cardiomyopathy due to significant stressors with hemorrhagic shock, TIPS etc and unlikely to represent true ACS.  -- No role for anticoagulation or anti-platelets iso acute bleeding and likely stress CM   -- Would not initiate GDMT at this point  -- May benefit from ischemic evaluation as outpatient should she recover sufficiently     Wide Complex Tachycardia   Reportedly had unstable MMVT with initial TIPS attempt requiring emergent cardioversion. Overnight 6/5-6/6, noted to have several episodes of wide complex tachycardia on telemetry as described below. EKG with rates 130s, cannot rule out ventricular tachycardia. Improvement on amiodarone  and has remained in sinus since then.   -- Continue amiodarone  gtt > can transition to PO amiodarone  to complete 5g load  -- Amiodarone  typically does not cause additional hepatic issues in the short term, but will need to consider long term strategy given decompensated liver disease         Baseline 05/06/24       Diagnoses addressed on this consultation:  Stress Cardiomyopathy, acute. Wide Complex Tachycardia.     I discussed the plan with the primary team via Epic chat    Thank you for this consult. This patient was seen and discussed with the consult attending. Please see attending attestation for further insights into management. If any further questions arise please page the cardiology consult pager [phone]) Monday - Friday from 8AM-5PM or the on-call cardiology pager [phone]) nights and weekends.     Subjective:     [name] is a 58 y.o. female with PMH CAD s/p NSTEMI (reportedly no stents), CKD, decompensated MASLD cirrhosis c/b  ascites, hepatic encephalopathy and EV s/p recent banding admitted with UGIB 2/2 bleeding esophageal ulcers s/p unsuccessful TIPS complicated by pulsatile VT s/p shocks requiring transfer to [facility] ICU.  In hemorrhagic shock secondary to gastric bleed on admission requiring MTP now s/p TIPS. Cardiology consulted for arrhythmias and elevated troponin.     Patient had a good weekend, weaning off pressors and sedation. Remains intubated but on minimal vent settings. Started TF.    Cardiovascular History:  -- CAD    Pertinent Medications:  -- amiodarone     Objective:     Temp:  [36.4 ??C (97.5 ??F)-37.3 ??C (99.1 ??F)] 37.3 ??  C (99.1 ??F)  Pulse:  [68-93] 84  SpO2 Pulse:  [68-93] 82  Resp:  [8-24] 20  FiO2 (%):  [35 %] 35 %  SpO2:  [86 %-98 %] 97 % A BP-2: (115-141)/(47-57) 123/50  MAP:  [69 mmHg-86 mmHg] 71 mmHg     Gen: Chronically ill appearing woman intubated/sedated   Heart:  regular rate/rhythm, +systolic murmur  Lungs: coarse intubated breath sounds, mild anterior crackles   Extremities: bilateral pitting edema; warm  Neuro: sedated  Skin:  psoriatic lesions across BLE      I have personally reviewed the following:  Pertinent notes from the primary and consulting services including HPI and hospital course.  Pertinent labs, including hemoglobin, troponin   Echocardiogram(s)  Chest X-ray(s)    I have independently interpreted the following:  Most recent ECG, notable for Sinus rhythm with low voltage .  Most recent echocardiogram, notable for mildly reduced EF in an atypical stress type pattern .    I have recommended the primary team order pertinent cardiac tests as detailed above in the assessment and plan.

## 2024-05-09 NOTE — Unmapped (Signed)
 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 The patient is seen in consultation at the request of Derry Flock, MD (Medical ICU ([facility])) for decompensated cirrhosis with variceal bleeding.    Patient with significant UGIB likely 2/2 gastric varix that was unable to be controlled endoscopically. Patient underwent successful salvage TIPS and has stopped bleeding. Unfortunately, she has been re-intubated (likely iso worsening HE). She has now started to show improvement from a renal/LFT standpoint. Expect that it will take some time for HE to improve.    Recommendations:  Dx  - Daily MELD labs  - Follow up infectious studies  Tx  - Abx per MICU team  - Cont IV PPI BID  - Continue lactulose  for 3-5 bowel movements daily and rifaximin      MELD 3.0: 27 at 05/10/2024  1:14 PM  MELD-Na: 24 at 05/10/2024  1:14 PM  Calculated from:  Serum Creatinine: 1.47 mg/dL at 1/61/0960  4:54 PM  Serum Sodium: 143 mmol/L (Using max of 137 mmol/L) at 05/10/2024  1:14 PM  Total Bilirubin: 13.1 mg/dL at 0/98/1191  4:78 PM  Serum Albumin : 3.1 g/dL at 2/95/6213  0:86 PM  INR(ratio): 1.39 at 05/10/2024  4:03 AM  Age at listing (hypothetical): 57 years  Sex: Female at 05/10/2024  1:14 PM      Issues Impacting Complexity of Management:  -The patient has the need for intensive monitoring parameter(s) due to high-risk of clinical decline: q4h or more frequent monitoring of hemoglobins to monitor for stability of GI bleeding and frequent monitoring of MELD score to monitor for progression to/progression of acute or acute on chronic liver failure    Recommendations discussed with the patient's primary team. We will continue to follow along with you.    Subjective:   - Intubated, on levo/vaso  - Somehwat more responsive  - Lots of bowel movements  - Hgb stable  - Lfts, Cr stable      Objective:   Temp:  [36.8 ??C (98.2 ??F)-37.5 ??C (99.5 ??F)] 37.5 ??C (99.5 ??F)  Pulse:  [82-102] 85  SpO2 Pulse:  [82-100] 85  Resp:  [10-18] 16  FiO2 (%):  [30 %-35 %] 30 %  SpO2:  [93 %-98 %] 97 %    Gen: Ill appearing  HEENT: intubated   Abdomen:  soft, moderately distended     Pertinent Labs & Studies:  -I have reviewed the patient's labs from 05/10/24 which show stable Hgb, worsening LFTs, and stable INR

## 2024-05-09 NOTE — Unmapped (Signed)
 MICU Daily Progress Note     Date of Service: 05/09/2024    Problem List:   Principal Problem:    Shock    Active Problems:    Type 2 diabetes mellitus, with long-term current use of insulin       Hyperlipidemia    Hypertension    Coronary artery disease involving native heart without angina pectoris    Diabetes mellitus      Esophageal varices in cirrhosis      Hepatic encephalopathy      Calcified cerebral meningioma      Portal hypertension      Other chronic pain    OSA (obstructive sleep apnea)    Stage 3a chronic kidney disease    Hematemesis      Interval history: Ashley Briggs is a 58 y.o. female with NASH cirrhosis (c/b ascites, hepatic encephalopathy, esophageal varices), CAD with prior NSTEMI, CKD who presented to OSH with hematemesis 6 days s/p variceal banding. Emergent TIPS attempted at OSH but not successful. She was transferred to [facility] MICU with 2 pressor shock. EGD 6/4 with significant bleeding from stomach that could not be cleared endoscopically. Acutely increasing pressor requirement on 6/4, ultimately requiring emergent TIPS. She was extubated on 6/5, but re-intubated later in the day for increasing hypoxia. She developed stress cardiomyopathy with pulmonary edema. Sustained wide complex tachycardia, now continues on amiodarone  gtt. Slowing improving pressor requirement. Remains on PRVC ventilation.     24hr events:   - Off vasopressin   - Stopped stress dose steroids  - MAP goal liberalized to > 65  - Weaned propofol , planning switch to PSV in planning for extubation in near future  - Stopped antibiotics  - Started TF  - Started VTE ppx with subcutaneous Heparin BID for now given risk of bleeding of thrombocytopenia     Neurological   Sedation  Intubated. Sedated with fentanyl  gtt, propofol  gtt  - Triglycerides q96h while on propofol  gtt  - Wean propofol  as tolerated  - Daily SAT    History of seizure-like activity (stable)  - Continue IV equivalent of home Keppra  750mg  BID    Chronic pain  History of chronic pain, treated with baclofen , naltrexone , oxycodone .   - Hold home baclofen , naltrextone, oxycodone  in the setting of hypotension and sedation    Major depressive disorder (stable)  - Hold home bupoprion, citalopram     Insomnia (stable)  - Hold home zolpidem     PMH calcified cerebral meningioma (stable)  - no intervention at this time    Analgesia: pain adequately controlled      Pulmonary   Intubation and mechanical ventilation  Previously intubated for procedures and altered mental status. Extubated 6/5, re-intubated later the same day for worsening hypoxia. Now continues on PRVC.   - Will lighten sedation and liberalize to PSV with daily SBT and hopeful to extubate soon as mental status improves    Pulmonary edema (improving)  Increasing hypoxia on 6/5, ultimately requiring re-intubation with FiO2 100%. CXR demonstrated moderate pulmonary edema. Etiology of pulmonary edema most likely cardiogenic given concurrent suspected stress cardiomyopathy, though she is also at risk for ARDS and noncardiogenic pulmonary edema cannot be excluded. Diuresed with improving oxygen requirement.   - CTM    Left pleural effusion (stable)  CXR 6/4 remarkable for moderate Left pleural effusion with passive atelectasis. S/p thoracentesis on 6/7. Pleural fluid c/w transudative effusion by protein parameters.   - CTM    Cardiovascular   Undifferentiated shock, hemorrhagic versus septic  Arrived to [facility] MICU in undifferentiated shock requiring 2 pressors. Acutely worsening hypotension on 6/4 requiring fourth pressor, aggressive fluid resuscitation, and massive transfusion protocol. S/p emergent TIPS on 6/4. Isolated fever on 6/6, prerenal AKI, and central venous O2 nearly 90 concerning for some component of septic shock, though infectious workup remains unremarkable. Pressor requirement improving, will continue to wean. With improving kidney function, will liberalize MAP goal to > 65.   - Continue norepi, wean for MAP goal > 65  - Stop stress dose steroids  - Stop antibiotics, infectious management as below    C/f stress cardiomyopathy - Demand Ischemia - Type II NSTEMI  With elevated troponin on admission (5.4k) likely 2/2 demand ischemia in the setting of shock. Echo on admission overall normal with EF > 55%. Developed several episodes of wide complex tachycardia on 6/5. Bedside POCUS with apical ballooning and slightly decreased EF to 45-50%. EKG was without acute ischemic changes. Troponin peaked at 23k (6/6). Formal TTE demonstrated reduced EF to 45% with basal and midventricular hypokinesis. Findings favored to represent stress cardiomyopathy rather than ACS in the absence of acute EKG changes.   - Cardiology consulted, appreciate recs   - Low concern for true ACS, no need for heparinization or ischemic eval acutely    Wide complex tachycardia - Ventricular tachycardia  Developed VT arrest requiring shock at OSH with initial TIPS attempt. On 6/5 developed several episodes of wide complex tachycardia. EKG with tachycardia to 130s, thought by cardiology to represent a fib with abberancy rather than VT. She was stabilized with amiodarone  bolus, continues on amiodarone  gtt.  - Cardiology consulted, appreciate recs   - Amiodarone  gtt   - Consider transition to po amiodarone  pending GI absorption   - Consider long term strategy for rhythm control given decompensated liver disease  - q8h BMP  - Replete K>4, Mg>2    CAD, prior NSTEMI  Prior NSTEMI per chart review. No LHC data available. Unclear if she has history of coronary stent.   - Home aspirin  held ISO bleeding    Renal   Prerenal AKI, c/f HRS (improving)  Cr elevated to 1.32 from baseline ~0.8. Urine lytes most consistent with prerenal etiology, 2/2 hypoperfusion iso shock versus HRS. Cr slowly improving, now with increasing urine output.   - Management of contributing etiologies as above  - q8h BMP     Infectious Disease/Autoimmune   Undifferentiated shock, c/f septic shock - Fever (resolved) - Leukocytosis (resolved)  Presented to [facility] MICU in 2-pressor shock with leukocytosis to 24.9. UA noninfectious, CXR without pneumonia. Bcx NGTD x 24 hours. Most likely infectious source intra-abdominal. SBP considered, unfortunately ascites from paracentesis on 6/4 were not sent for diagnostic studies. No skin findings on exam to suggest SSTI. Femoral line placed at OSH possibly an infectious source, though Bcx have been NGTD. S/p vanc/zosyn . S/p tobramycin  x 1. With fever to 38.4 on 6/6. Continues now on vanc/zosyn . Paracentesis with PMNs < 250. Thoracentesis transudative. UA noninfectious. Lrcx gram stain with 1+ yeast, 1+ GPC. Overall, she has now received 6 days of broad spectrum antibiotic coverage thorough workup unremarkable for underlying infectious etiology.   - Bcx 6/4 NGTD, repeat Bcx 6/6 NGTD x 48 hrs  - Ucx negative  - Paracentesis with ANC < 250, thoracentesis transudative  - Lrcx growing oropharyngeal flora  - Discontinue vanc/zosyn     Cultures:  Blood Culture, Routine (no units)   Date Value   05/06/2024 No Growth at 48 hours   05/06/2024 No  Growth at 48 hours     Urine Culture, Comprehensive (no units)   Date Value   05/04/2024 NO GROWTH     Lower Respiratory Culture (no units)   Date Value   05/07/2024 OROPHARYNGEAL FLORA ISOLATED   05/06/2024 Specimen Not Processed     WBC (10*9/L)   Date Value   05/09/2024 10.0     WBC, UA (/HPF)   Date Value   05/06/2024 1          FEN/GI   Decompensated cirrhosis c/b esophageal varices, gastric varix, hepatic encephalopathy, ascites  Presenting with hematemesis 6 days after esophageal banding. Emergent TIPS at OSH not successful d/t inability to access the portal vein. EGD on 6/4 demonstrated nonbleeding esophageal ulcers at site of prior banding, stomach completely occluded by blood which could not be cleared endoscopically. With acute decline in Hb and hypotension on 6/4. Darius Edouard attempted with GI, but unsuccessful after numerous attempts. S/p salvage TIPS on 6/4. Ongoing melena and dark red NGT output with stable Hb felt to represent prior bleeding. Low clinical concern for active GI bleeding.   - S/p salvage TIPS  - Hepatology following  - Daily MELLD labs (CMP, PT/INR)  - Maintain fibrinogen > 100 with cryo  - Home lasix , spironolactone  held d/t hypotension  - Continue home lactulose , rifaximin     MELD 3.0: 27 at 05/09/2024  4:13 AM  MELD-Na: 24 at 05/09/2024  4:13 AM  Calculated from:  Serum Creatinine: 1.54 mg/dL at 07/01/1913  7:82 AM  Serum Sodium: 140 mmol/L (Using max of 137 mmol/L) at 05/09/2024  4:13 AM  Total Bilirubin: 12.1 mg/dL at 08/05/6212  0:86 AM  Serum Albumin : 3.6 g/dL (Using max of 3.5 g/dL) at 04/06/8468  6:29 AM  INR(ratio): 1.43 at 05/09/2024  4:13 AM  Age at listing (hypothetical): 57 years  Sex: Female at 05/09/2024  4:13 AM      Acute on chronic liver failure - Ischemic hepatitis (improving)  LFTs continue to rise s/p variceal hemorrhage and TIPS. Unfortunately, precluded from transplant eval with newly reduced EF. LFTs slowly improving, suggesting ongoing recovery from TIPS and hemorrhagic event.  - Hepatology following     Malnutrition Assessment:   With improving vasopressor requirement, will plan to start tube feeds today:  Tube feeds:   If norepi trending down/able to reduce to single pressor, start trickle feeds: Recommend Novasource Renal at goal rate 20 mL/hr. This provides 840 kcals, 40 g protein, 77 g carbohydrate, 43 g fat, 0 g fiber, 299 mL free water , and meets 42% USRDI.  Goal tube feeds (when norepi <10)  Recommend Novasource Renal at goal rate 30 mL/hr. This provides 1260 kcals, 59 g protein, 115 g carbohydrate, 64 g fat, 0 g fiber, 448 mL free water , and meets 63% USRDI.  Prosource TF 2 pkts BID   Total: 1500 kcal, 119 g protein       Heme/Coag   Blood loss anemia, likely 2/2 refractory portal hypertensive bleeding (stable)  S/p 3 units pRBCs at OSH, Hb 9.3 on arrival to MICU. S/p massive transfusion protocol on 6/4. Low clinical concern for ongoing blood loss with stable Hb, last transfusion 6/4.   - q8h CBC with diff    Mild Thrombocytopenia (stable)  Plts 253k on admission, acute decline to 50k on 6/5. Currently without signs of active bleeding. Plts remain stable around ~100k.  - q8h CBC with diff    Endocrine   Insulin -dependent T2DM  A1c 5.6 on admission. Home regimen lantus   60, lispro with meals. BG control challenging in the setting of critical illness and stress dose steroids. Initiated insulin  infusion with endotool 6/8. Discontinuing stress dose steroids 6/9, will continue conversion to NPH once sugars stabilized.   - Continuous insulin  infusion  - Consider NPH with regular SSI once sugars stabilized post-steroids    Integumentary   Psoriasis (stable)  History of psoriasis per chart review. With numerous chronic appearing scattered erythematous plaques with silvery scale. Of note, on Secukinumab  for psoriasis management, which can increase risk for infection.    # Sacral wound, 01/2024  - WOCN consulted for high risk skin assessment Yes.  - cont pressure mitigating precautions per skin policy    Prophylaxis/LDA/Restraints/Consults   ICU checklist completed: yes (see ICU rounding navigator in Epic)    Patient Lines/Drains/Airways Status       Active Active Lines, Drains, & Airways       Name Placement date Placement time Site Days    ETT  7.5 05/05/24  1810  -- 3    CVC Triple Lumen 05/05/24 Non-tunneled Left Internal jugular 05/05/24  1740  Internal jugular  3    NG/OG Tube Right nostril 05/06/24  1930  Right nostril  2    Urethral Catheter 05/04/24  0500  --  5    Peripheral IV 05/04/24 Anterior;Left;Upper Arm 05/04/24  0500  Arm  5    Peripheral IV 05/04/24 Anterior;Right;Upper Arm 05/04/24  0500  Arm  5    Peripheral IV 05/06/24 Right Arm 05/06/24  1716  Arm  2    Arterial Line 05/06/24 Right Radial 05/06/24  1716  Radial  2                  Patient Lines/Drains/Airways Status       Active Wounds       Name Placement date Placement time Site Days    Surgical Site 05/04/24 Abdomen Lower;Right 05/04/24  2300  -- 4    Surgical Site 05/04/24 Neck Right 05/04/24  2300  -- 4    Surgical Site 05/04/24 Groin Right 05/04/24  2300  -- 4                  VTE ppx: SQH  Bowel regimen: lactulose  and rifaxamin    Goals of Care     Code Status: Full Code    Designated Healthcare Decision Maker:  Ms. Liss current decisional capacity for healthcare decision-making is Full capacity. Her designated healthcare decision maker(s) is/are   HCDM (patient stated preference): Caudle,Ryan - Relative - [phone].      Subjective     Patient lying in bed, intubated and sedated.     Objective     Vitals - past 24 hours  Temp:  [36.4 ??C (97.5 ??F)-37.3 ??C (99.1 ??F)] 37.3 ??C (99.1 ??F)  Pulse:  [68-93] 80  SpO2 Pulse:  [68-93] 82  Resp:  [8-24] 18  FiO2 (%):  [35 %] 35 %  SpO2:  [86 %-98 %] 96 % Intake/Output  I/O last 3 completed shifts:  In: 2718.8 [I.V.:2030.4; NG/GT:330; IV Piggyback:358.3]  Out: 2157 [Urine:2157]     Physical Exam:    General: Ill-appearing female, lying in bed. Intubated and sedated.   HEENT: Mucous membranes dry.  CV: Tachycardic. Regular rhythm.   Pulm: CTAB on anterior exam.  GI: Abd soft, nontender.  Extremities: Pulses weak but palpable. 1+ pitting edema to bilateral lower extremities.   Skin: Diffuse scattered erythematous plaques with silvery scale.  No bruising, petechiae, mucocutaneous bleeding.  Neuro: Intubated and sedated. Does not respond to voice, touch, or withdraw to pain.    Continuous Infusions:    amiodarone  0.5 mg/min (05/09/24 0100)    fentaNYL  citrate (PF) 50 mcg/mL infusion 100 mcg/hr (05/09/24 0150)    insulin  regular 2.4 Units/hr (05/09/24 0528)    NORepinephrine  bitartrate-NS 10 mcg/min (05/09/24 0604)    propofol  10 mg/mL infusion 10 mcg/kg/min (05/09/24 0100)    vasopressin  Stopped (05/08/24 1530)       Scheduled Medications:    hydrocortisone  sod succ  50 mg Intravenous Q6H    lactulose   20 g Enteral tube: gastric QID    levETIRAcetam   750 mg Intravenous Q12H [facility]    pantoprazole  (Protonix ) intravenous solution  40 mg Intravenous BID    piperacillin -tazobactam  4.5 g Intravenous Q8H [facility]    rifAXIMin   550 mg Enteral tube: gastric BID    sodium chloride   10 mL Intravenous Q8H    sodium chloride   10 mL Intravenous Q8H    sodium chloride   10 mL Intravenous Q8H    Vancomycin  - Pharmacy dosing by levels   Other Pharmacy dosing       PRN medications:  dextrose  in water , dextrose  in water , fentaNYL  (PF) **OR** fentaNYL  (PF), glucagon, glucose, midazolam , phenylephrine     Data/Imaging Review: Reviewed in Epic and personally interpreted on 05/09/2024. See EMR for detailed results.    Vallery Gavel, MS4  Macarthur Savory,  MD

## 2024-05-10 LAB — CBC W/ AUTO DIFF
BASOPHILS ABSOLUTE COUNT: 0 10*9/L (ref 0.0–0.1)
BASOPHILS ABSOLUTE COUNT: 0 10*9/L (ref 0.0–0.1)
BASOPHILS ABSOLUTE COUNT: 0 10*9/L (ref 0.0–0.1)
BASOPHILS RELATIVE PERCENT: 0.1 %
BASOPHILS RELATIVE PERCENT: 0.2 %
BASOPHILS RELATIVE PERCENT: 0.4 %
EOSINOPHILS ABSOLUTE COUNT: 0 10*9/L (ref 0.0–0.5)
EOSINOPHILS ABSOLUTE COUNT: 0 10*9/L (ref 0.0–0.5)
EOSINOPHILS ABSOLUTE COUNT: 0 10*9/L (ref 0.0–0.5)
EOSINOPHILS RELATIVE PERCENT: 0 %
EOSINOPHILS RELATIVE PERCENT: 0.3 %
EOSINOPHILS RELATIVE PERCENT: 0.6 %
HEMATOCRIT: 29.4 % — ABNORMAL LOW (ref 34.0–44.0)
HEMATOCRIT: 29.1 % — ABNORMAL LOW (ref 34.0–44.0)
HEMATOCRIT: 28.7 % — ABNORMAL LOW (ref 34.0–44.0)
HEMOGLOBIN: 9.7 g/dL — ABNORMAL LOW (ref 11.3–14.9)
HEMOGLOBIN: 9.6 g/dL — ABNORMAL LOW (ref 11.3–14.9)
HEMOGLOBIN: 9.4 g/dL — ABNORMAL LOW (ref 11.3–14.9)
LYMPHOCYTES ABSOLUTE COUNT: 0.8 10*9/L — ABNORMAL LOW (ref 1.1–3.6)
LYMPHOCYTES ABSOLUTE COUNT: 0.8 10*9/L — ABNORMAL LOW (ref 1.1–3.6)
LYMPHOCYTES ABSOLUTE COUNT: 0.8 10*9/L — ABNORMAL LOW (ref 1.1–3.6)
LYMPHOCYTES RELATIVE PERCENT: 7.6 %
LYMPHOCYTES RELATIVE PERCENT: 9.2 %
LYMPHOCYTES RELATIVE PERCENT: 9.3 %
MEAN CORPUSCULAR HEMOGLOBIN CONC: 33.1 g/dL (ref 32.0–36.0)
MEAN CORPUSCULAR HEMOGLOBIN CONC: 33 g/dL (ref 32.0–36.0)
MEAN CORPUSCULAR HEMOGLOBIN CONC: 32.7 g/dL (ref 32.0–36.0)
MEAN CORPUSCULAR HEMOGLOBIN: 29.3 pg (ref 25.9–32.4)
MEAN CORPUSCULAR HEMOGLOBIN: 29.4 pg (ref 25.9–32.4)
MEAN CORPUSCULAR HEMOGLOBIN: 29.2 pg (ref 25.9–32.4)
MEAN CORPUSCULAR VOLUME: 88.7 fL (ref 77.6–95.7)
MEAN CORPUSCULAR VOLUME: 89.2 fL (ref 77.6–95.7)
MEAN CORPUSCULAR VOLUME: 89.2 fL (ref 77.6–95.7)
MEAN PLATELET VOLUME: 8.2 fL (ref 6.8–10.7)
MEAN PLATELET VOLUME: 8.1 fL (ref 6.8–10.7)
MEAN PLATELET VOLUME: 8.2 fL (ref 6.8–10.7)
MONOCYTES ABSOLUTE COUNT: 2.3 10*9/L — ABNORMAL HIGH (ref 0.3–0.8)
MONOCYTES ABSOLUTE COUNT: 2 10*9/L — ABNORMAL HIGH (ref 0.3–0.8)
MONOCYTES ABSOLUTE COUNT: 1.9 10*9/L — ABNORMAL HIGH (ref 0.3–0.8)
MONOCYTES RELATIVE PERCENT: 21.2 %
MONOCYTES RELATIVE PERCENT: 22.3 %
MONOCYTES RELATIVE PERCENT: 21.9 %
NEUTROPHILS ABSOLUTE COUNT: 7.8 10*9/L (ref 1.8–7.8)
NEUTROPHILS ABSOLUTE COUNT: 6 10*9/L (ref 1.8–7.8)
NEUTROPHILS ABSOLUTE COUNT: 5.8 10*9/L (ref 1.8–7.8)
NEUTROPHILS RELATIVE PERCENT: 71.1 %
NEUTROPHILS RELATIVE PERCENT: 68 %
NEUTROPHILS RELATIVE PERCENT: 67.8 %
PLATELET COUNT: 118 10*9/L — ABNORMAL LOW (ref 150–450)
PLATELET COUNT: 102 10*9/L — ABNORMAL LOW (ref 150–450)
PLATELET COUNT: 94 10*9/L — ABNORMAL LOW (ref 150–450)
RED BLOOD CELL COUNT: 3.31 10*12/L — ABNORMAL LOW (ref 3.95–5.13)
RED BLOOD CELL COUNT: 3.26 10*12/L — ABNORMAL LOW (ref 3.95–5.13)
RED BLOOD CELL COUNT: 3.21 10*12/L — ABNORMAL LOW (ref 3.95–5.13)
RED CELL DISTRIBUTION WIDTH: 19.2 % — ABNORMAL HIGH (ref 12.2–15.2)
RED CELL DISTRIBUTION WIDTH: 18.6 % — ABNORMAL HIGH (ref 12.2–15.2)
RED CELL DISTRIBUTION WIDTH: 18.9 % — ABNORMAL HIGH (ref 12.2–15.2)
WBC ADJUSTED: 11 10*9/L (ref 3.6–11.2)
WBC ADJUSTED: 8.8 10*9/L (ref 3.6–11.2)
WBC ADJUSTED: 8.5 10*9/L (ref 3.6–11.2)

## 2024-05-10 LAB — URINALYSIS WITH MICROSCOPY
BACTERIA: NONE SEEN /HPF
GLUCOSE UA: 500 — AB
KETONES UA: NEGATIVE
NITRITE UA: NEGATIVE
PH UA: 5.5 (ref 5.0–9.0)
PROTEIN UA: 30 — AB
RBC UA: >182 /HPF — ABNORMAL HIGH (ref <=4)
SPECIFIC GRAVITY UA: 1.025 (ref 1.003–1.030)
SQUAMOUS EPITHELIAL: <1 /HPF (ref 0–5)
UROBILINOGEN UA: <2
WBC UA: 25 /HPF — ABNORMAL HIGH (ref 0–5)

## 2024-05-10 LAB — COMPREHENSIVE METABOLIC PANEL
ALBUMIN: 3.3 g/dL — ABNORMAL LOW (ref 3.4–5.0)
ALBUMIN: 3.1 g/dL — ABNORMAL LOW (ref 3.4–5.0)
ALBUMIN: 2.9 g/dL — ABNORMAL LOW (ref 3.4–5.0)
ALKALINE PHOSPHATASE: 91 U/L (ref 46–116)
ALKALINE PHOSPHATASE: 96 U/L (ref 46–116)
ALKALINE PHOSPHATASE: 90 U/L (ref 46–116)
ALT (SGPT): 431 U/L — ABNORMAL HIGH (ref 10–49)
ALT (SGPT): 364 U/L — ABNORMAL HIGH (ref 10–49)
ALT (SGPT): 317 U/L — ABNORMAL HIGH (ref 10–49)
ANION GAP: 11 mmol/L (ref 5–14)
ANION GAP: 12 mmol/L (ref 5–14)
ANION GAP: 11 mmol/L (ref 5–14)
AST (SGOT): 550 U/L — ABNORMAL HIGH (ref <=34)
AST (SGOT): 351 U/L — ABNORMAL HIGH (ref <=34)
AST (SGOT): 259 U/L — ABNORMAL HIGH (ref <=34)
BILIRUBIN TOTAL: 13.3 mg/dL — ABNORMAL HIGH (ref 0.3–1.2)
BILIRUBIN TOTAL: 13.1 mg/dL — ABNORMAL HIGH (ref 0.3–1.2)
BILIRUBIN TOTAL: 11.5 mg/dL — ABNORMAL HIGH (ref 0.3–1.2)
BLOOD UREA NITROGEN: 77 mg/dL — ABNORMAL HIGH (ref 9–23)
BLOOD UREA NITROGEN: 81 mg/dL — ABNORMAL HIGH (ref 9–23)
BLOOD UREA NITROGEN: 85 mg/dL — ABNORMAL HIGH (ref 9–23)
BUN / CREAT RATIO: 50
BUN / CREAT RATIO: 55
BUN / CREAT RATIO: 59
CALCIUM: 9.6 mg/dL (ref 8.7–10.4)
CALCIUM: 9.5 mg/dL (ref 8.7–10.4)
CALCIUM: 9.1 mg/dL (ref 8.7–10.4)
CHLORIDE: 106 mmol/L (ref 98–107)
CHLORIDE: 106 mmol/L (ref 98–107)
CHLORIDE: 107 mmol/L (ref 98–107)
CO2: 24 mmol/L (ref 20.0–31.0)
CO2: 25 mmol/L (ref 20.0–31.0)
CO2: 26 mmol/L (ref 20.0–31.0)
CREATININE: 1.53 mg/dL — ABNORMAL HIGH (ref 0.55–1.02)
CREATININE: 1.47 mg/dL — ABNORMAL HIGH (ref 0.55–1.02)
CREATININE: 1.45 mg/dL — ABNORMAL HIGH (ref 0.55–1.02)
EGFR CKD-EPI (2021) FEMALE: 39 mL/min/1.73m2 — ABNORMAL LOW (ref >=60)
EGFR CKD-EPI (2021) FEMALE: 41 mL/min/1.73m2 — ABNORMAL LOW (ref >=60)
EGFR CKD-EPI (2021) FEMALE: 42 mL/min/1.73m2 — ABNORMAL LOW (ref >=60)
GLUCOSE RANDOM: 313 mg/dL — ABNORMAL HIGH (ref 70–179)
GLUCOSE RANDOM: 438 mg/dL (ref 70–179)
GLUCOSE RANDOM: 379 mg/dL — ABNORMAL HIGH (ref 70–179)
POTASSIUM: 4 mmol/L (ref 3.4–4.8)
POTASSIUM: 3.8 mmol/L (ref 3.4–4.8)
POTASSIUM: 3.5 mmol/L (ref 3.4–4.8)
PROTEIN TOTAL: 6.2 g/dL (ref 5.7–8.2)
PROTEIN TOTAL: 6 g/dL (ref 5.7–8.2)
PROTEIN TOTAL: 6 g/dL (ref 5.7–8.2)
SODIUM: 141 mmol/L (ref 135–145)
SODIUM: 143 mmol/L (ref 135–145)
SODIUM: 144 mmol/L (ref 135–145)

## 2024-05-10 LAB — BLOOD GAS, ARTERIAL
BASE EXCESS ARTERIAL: -3.3 — ABNORMAL LOW (ref -2.0–2.0)
HCO3 ARTERIAL: 23 mmol/L (ref 22–27)
O2 SATURATION ARTERIAL: 99.5 % (ref 94.0–100.0)
PCO2 ARTERIAL: 49 mmHg — ABNORMAL HIGH (ref 35.0–45.0)
PH ARTERIAL: 7.29 — ABNORMAL LOW (ref 7.35–7.45)
PO2 ARTERIAL: 148 mmHg — ABNORMAL HIGH (ref 80.0–110.0)

## 2024-05-10 LAB — BLOOD GAS CRITICAL CARE PANEL, VENOUS
BASE EXCESS VENOUS: -0.5 (ref -2.0–2.0)
CALCIUM IONIZED VENOUS (MG/DL): 5.33 mg/dL (ref 4.40–5.40)
GLUCOSE WHOLE BLOOD: 404 mg/dL (ref 70–179)
HCO3 VENOUS: 25 mmol/L (ref 22–27)
HEMOGLOBIN BLOOD GAS: 9.5 g/dL — ABNORMAL LOW (ref 12.00–16.00)
LACTATE BLOOD VENOUS: 2.1 mmol/L — ABNORMAL HIGH (ref 0.5–1.8)
O2 SATURATION VENOUS: 85 % (ref 40.0–85.0)
PCO2 VENOUS: 50 mmHg (ref 40–60)
PH VENOUS: 7.32 (ref 7.32–7.43)
PO2 VENOUS: 54 mmHg (ref 30–55)
POTASSIUM WHOLE BLOOD: 3.6 mmol/L (ref 3.4–4.6)
SODIUM WHOLE BLOOD: 143 mmol/L (ref 135–145)

## 2024-05-10 LAB — MAGNESIUM
MAGNESIUM: 2.7 mg/dL — ABNORMAL HIGH (ref 1.6–2.6)
MAGNESIUM: 2.6 mg/dL (ref 1.6–2.6)
MAGNESIUM: 2.6 mg/dL (ref 1.6–2.6)

## 2024-05-10 LAB — PROTIME-INR
INR: 1.39
INR: 1.45
PROTIME: 15.8 s — ABNORMAL HIGH (ref 9.9–12.6)
PROTIME: 16.5 s — ABNORMAL HIGH (ref 9.9–12.6)

## 2024-05-10 LAB — O2 SATURATION VENOUS: O2 SATURATION VENOUS: 86.7 % — ABNORMAL HIGH (ref 40.0–85.0)

## 2024-05-10 LAB — LACTATE, ARTERIAL, WHOLE BLOOD: LACTATE BLOOD ARTERIAL: 1.8 mmol/L — ABNORMAL HIGH (ref <1.3)

## 2024-05-10 LAB — FIBRINOGEN: FIBRINOGEN LEVEL: 120 mg/dL — ABNORMAL LOW (ref 175–500)

## 2024-05-10 MED ADMIN — pantoprazole (Protonix) injection 40 mg: 40 mg | INTRAVENOUS | @ 12:00:00

## 2024-05-10 MED ADMIN — NORepinephrine 8 mg in dextrose 5 % 250 mL (32 mcg/mL) infusion PMB: 0-40 ug/min | INTRAVENOUS | @ 17:00:00

## 2024-05-10 MED ADMIN — insulin regular (HumuLIN,NovoLIN) inj CORRECTIONAL 0-20 Units: 0-20 [IU] | SUBCUTANEOUS | @ 20:00:00

## 2024-05-10 MED ADMIN — lactulose oral solution: 20 g | GASTROENTERAL | @ 01:00:00

## 2024-05-10 MED ADMIN — NORepinephrine 8 mg in dextrose 5 % 250 mL (32 mcg/mL) infusion PMB: 0-40 ug/min | INTRAVENOUS | @ 06:00:00

## 2024-05-10 MED ADMIN — insulin regular (HumuLIN,NovoLIN) inj CORRECTIONAL 0-20 Units: 0-20 [IU] | SUBCUTANEOUS | @ 12:00:00 | Stop: 2024-05-10

## 2024-05-10 MED ADMIN — levETIRAcetam (KEPPRA) injection 750 mg: 750 mg | INTRAVENOUS | @ 01:00:00

## 2024-05-10 MED ADMIN — amiodarone 360 mg in dextrose 5% 200 mL (1.8 mg/mL) infusion PMB: .5 mg/min | INTRAVENOUS | @ 06:00:00 | Stop: 2024-05-10

## 2024-05-10 MED ADMIN — insulin NPH (HumuLIN,NovoLIN) injection 10 Units: 10 [IU] | SUBCUTANEOUS | @ 12:00:00 | Stop: 2024-05-10

## 2024-05-10 MED ADMIN — levETIRAcetam (KEPPRA) injection 750 mg: 750 mg | INTRAVENOUS | @ 12:00:00

## 2024-05-10 MED ADMIN — sodium chloride (NS) 0.9 % flush 10 mL: 10 mL | INTRAVENOUS | @ 08:00:00

## 2024-05-10 MED ADMIN — heparin (porcine) 5,000 unit/mL injection 5,000 Units: 5000 [IU] | SUBCUTANEOUS | @ 12:00:00

## 2024-05-10 MED ADMIN — rifAXIMin (XIFAXAN) oral suspension: 550 mg | GASTROENTERAL | @ 01:00:00 | Stop: 2024-05-10

## 2024-05-10 MED ADMIN — midodrine (PROAMATINE) tablet 10 mg: 10 mg | ORAL | @ 17:00:00 | Stop: 2024-05-10

## 2024-05-10 MED ADMIN — midodrine (PROAMATINE) tablet 5 mg: 5 mg | ORAL | @ 21:00:00

## 2024-05-10 MED ADMIN — sodium chloride (NS) 0.9 % flush 10 mL: 10 mL | INTRAVENOUS | @ 01:00:00

## 2024-05-10 MED ADMIN — lactated ringers bolus 500 mL: 500 mL | INTRAVENOUS | @ 14:00:00 | Stop: 2024-05-10

## 2024-05-10 MED ADMIN — perflutren protein type-A microspheres (OPTISON) injection 3 mL: 3 mL | INTRAVENOUS | @ 18:00:00 | Stop: 2024-05-10

## 2024-05-10 MED ADMIN — multivitamins, therapeutic with minerals tablet 1 tablet: 1 | GASTROENTERAL | @ 14:00:00

## 2024-05-10 MED ADMIN — rifAXIMin (XIFAXAN) oral suspension: 550 mg | GASTROENTERAL | @ 12:00:00 | Stop: 2024-05-10

## 2024-05-10 MED ADMIN — lactulose oral solution: 20 g | GASTROENTERAL | @ 09:00:00 | Stop: 2024-05-10

## 2024-05-10 MED ADMIN — heparin (porcine) 5,000 unit/mL injection 5,000 Units: 5000 [IU] | SUBCUTANEOUS | @ 01:00:00

## 2024-05-10 MED ADMIN — sodium chloride (NS) 0.9 % flush 10 mL: 10 mL | INTRAVENOUS | @ 17:00:00

## 2024-05-10 MED ADMIN — insulin NPH (HumuLIN,NovoLIN) injection 20 Units: 20 [IU] | SUBCUTANEOUS | @ 12:00:00 | Stop: 2024-05-10

## 2024-05-10 MED ADMIN — insulin regular (HumuLIN,NovoLIN) inj CORRECTIONAL 0-20 Units: 0-20 [IU] | SUBCUTANEOUS | @ 17:00:00 | Stop: 2024-05-10

## 2024-05-10 MED ADMIN — insulin NPH (HumuLIN,NovoLIN) injection 36 Units: 36 [IU] | SUBCUTANEOUS | @ 22:00:00

## 2024-05-10 MED ADMIN — amiodarone (PACERONE) tablet 400 mg: 400 mg | GASTROENTERAL | @ 17:00:00 | Stop: 2024-05-12

## 2024-05-10 MED ADMIN — amiodarone (PACERONE) tablet 400 mg: 400 mg | GASTROENTERAL | @ 21:00:00 | Stop: 2024-05-12

## 2024-05-10 MED ADMIN — insulin regular (HumuLIN,NovoLIN) inj PERCENTAGE MEAL EATEN 10 Units: 10 [IU] | SUBCUTANEOUS | @ 22:00:00 | Stop: 2024-05-10

## 2024-05-10 MED ADMIN — pantoprazole (Protonix) injection 40 mg: 40 mg | INTRAVENOUS | @ 01:00:00

## 2024-05-10 NOTE — Unmapped (Signed)
 MICU Daily Progress Note     Date of Service: 05/10/2024    Problem List:   Principal Problem:    Shock     Active Problems:    Type 2 diabetes mellitus, with long-term current use of insulin        Hyperlipidemia    Hypertension    Coronary artery disease involving native heart without angina pectoris    Diabetes mellitus       Esophageal varices in cirrhosis       Hepatic encephalopathy       Calcified cerebral meningioma       Portal hypertension       Other chronic pain    OSA (obstructive sleep apnea)    Stage 3a chronic kidney disease (CMS-HCC)    Hematemesis      Interval history: Ashley Briggs is a 58 y.o. female with PMH CAD s/p NSTEMI, CKD, NAS cirrhosis c/b ascites, hepatic encephalopathy and EV s/p recent banding admitted with UGI bleed 2/2 bleeding esophageal ulcers s/p unsusccessful TIPS c/b pulsatile VT s/p shock requiring transfer to [facility] ICU. Course complicated by severe hemorrhagic shock 2/2 gastric bleed now s/p TIPS with course complicated by acute hypoxic respiratory failure, AKI, stress cardiomyopathy and hepatic encephalopathy. Now with slight increase in pressor requirement. Weaning sedation to plan for extubation in the near future.     24hr events:   - Discontinued antibiotics 6/9  - Discontinued stress dose steroids 6/9  - Pressor requirement slightly increasing  - Given 500cc fluid    Neurological   Sedation  Intubated. Sedation weaned in anticipation of extubation in the near future.     History of seizure-like activity (stable)  - Continue IV equivalent of home Keppra  750mg  BID    Chronic pain  History of chronic pain, treated with baclofen , naltrexone , oxycodone .   - Hold home baclofen , naltrextone, oxycodone  in the setting of hypotension and sedation    Major depressive disorder (stable)  - Hold home bupoprion, citalopram     Insomnia (stable)  - Hold home zolpidem     PMH calcified cerebral meningioma (stable)  - no intervention at this time    Analgesia: pain adequately controlled      Pulmonary   Intubation and mechanical ventilation  Previously intubated for procedures and altered mental status. Extubated 6/5, re-intubated later the same day for worsening hypoxia. Now continues on ASV. Sedation weaned in anticipation of extubation as soon as mental status improves.  - Daily SBT    ASV, FiO2 30%, PEEP 5  ABG 7.29/49/148    Pulmonary edema (improving)  Increasing hypoxia on 6/5, ultimately requiring re-intubation with FiO2 100%. CXR demonstrated moderate pulmonary edema. Etiology of pulmonary edema most likely cardiogenic given concurrent suspected stress cardiomyopathy, though she is also at risk for ARDS and noncardiogenic pulmonary edema cannot be excluded. Diuresed with improving oxygen requirement.   - CTM    Left pleural effusion (stable)  CXR 6/4 remarkable for moderate Left pleural effusion with passive atelectasis. S/p thoracentesis on 6/7. Pleural fluid c/w transudative effusion by protein parameters.   - CTM    Cardiovascular   Undifferentiated shock, hemorrhagic versus septic  Arrived to [facility] MICU in undifferentiated shock requiring 2 pressors. Acutely worsening hypotension on 6/4 requiring fourth pressor, aggressive fluid resuscitation, and massive transfusion protocol. S/p emergent TIPS on 6/4. Isolated fever on 6/6, prerenal AKI, and central venous O2 nearly 90 concerning for some component of septic shock, though infectious workup remains unremarkable, and central venous O2  may be confounded in the setting of cirrhosis. With slightly increasing pressor requirement overnight. Low clinical concern for underlying infection, will treat with IV fluids and resume home midodrine.  - Norepi, wean for MAP goal > 65  - Start midodrine 10mg  TID  - 500cc LR, redose as tolerated    C/f stress cardiomyopathy - Demand Ischemia - Type II NSTEMI  With elevated troponin on admission (5.4k) likely 2/2 demand ischemia in the setting of shock. Echo on admission overall normal with EF > 55%. Developed several episodes of wide complex tachycardia on 6/5. Bedside POCUS with apical ballooning and slightly decreased EF to 45-50%. EKG was without acute ischemic changes. Troponin peaked at 23k (6/6). Formal TTE demonstrated reduced EF to 45% with basal and midventricular hypokinesis. Findings favored to represent stress cardiomyopathy rather than ACS in the absence of acute EKG changes.   - Cardiology consulted, appreciate recs   - Low concern for true ACS, no need for heparinization or ischemic eval acutely  - Follow up repeat TTE to assess for cardiovascular recovery    Wide complex tachycardia - Ventricular tachycardia  Developed VT arrest requiring shock at OSH with initial TIPS attempt. On 6/5 developed several episodes of wide complex tachycardia. EKG with tachycardia to 130s, thought by cardiology to represent a fib with abberancy rather than VT. She was stabilized with amiodarone  bolus, followed by amiodarone  gtt. Will transition to PO amio to finish remainder of amiodarone  load, no plan for amio maintenance.   - Cardiology consulted, appreciate recs   - Stop amiodarone  gtt   - Start po amiodarone  400mg  TID x 6 doses   - Consider long term strategy for rhythm control given decompensated liver disease  - q8h BMP  - Replete K>4, Mg>2    CAD, prior NSTEMI  Prior NSTEMI per chart review. No LHC data available. Unclear if she has history of coronary stent.   - Home aspirin  held ISO bleeding    Renal   Prerenal AKI, c/f HRS (improving)  Cr elevated to 1.32 from baseline ~0.8. Urine lytes most consistent with prerenal etiology, 2/2 hypoperfusion iso shock versus HRS. Cr slowly improving, now with increasing urine output.   - Management of contributing etiologies as above  - q8h BMP     Infectious Disease/Autoimmune   Undifferentiated shock, c/f septic shock - Fever (resolved) - Leukocytosis (resolved)  Presented to [facility] MICU in 2-pressor shock with leukocytosis to 24.9. UA noninfectious, CXR without pneumonia. Bcx NGTD x 24 hours. Most likely infectious source intra-abdominal. SBP considered, unfortunately ascites from paracentesis on 6/4 were not sent for diagnostic studies. No skin findings on exam to suggest SSTI. Femoral line placed at OSH possibly an infectious source, though Bcx have been NGTD. S/p vanc/zosyn . S/p tobramycin  x 1. With fever to 38.4 on 6/6. Continues now on vanc/zosyn . Paracentesis with PMNs < 250. Thoracentesis transudative. UA noninfectious. Lrcx gram stain with 1+ yeast, 1+ GPC. Overall, she has now received 6 days of broad spectrum antibiotic coverage thorough workup unremarkable for underlying infectious etiology. Increasing pressor requirement overnight 6/9. Repeat infectious workup pending, though low clinical concern for infection at this time.   - Bcx 6/4 NGTD, Bcx 6/6 NGTD x 48 hrs, Bcx 6/10 pending  - Ucx 6/4 negative  - Paracentesis 6/7 with ANC < 250, thoracentesis transudative  - Lrcx growing oropharyngeal flora  - Deferring further antibiotics iso low concern for infection    Cultures:  Blood Culture, Routine (no units)   Date Value  05/06/2024 No Growth at 72 hours   05/06/2024 No Growth at 72 hours     Urine Culture, Comprehensive (no units)   Date Value   05/04/2024 NO GROWTH     Lower Respiratory Culture (no units)   Date Value   05/07/2024 OROPHARYNGEAL FLORA ISOLATED   05/06/2024 Specimen Not Processed     WBC (10*9/L)   Date Value   05/10/2024 11.0     WBC, UA (/HPF)   Date Value   05/06/2024 1          FEN/GI   Decompensated cirrhosis c/b esophageal varices, gastric varix, hepatic encephalopathy, ascites   Presenting with hematemesis 6 days after esophageal banding. Emergent TIPS at OSH not successful d/t inability to access the portal vein. EGD on 6/4 demonstrated nonbleeding esophageal ulcers at site of prior banding, stomach completely occluded by blood which could not be cleared endoscopically. With acute decline in Hb and hypotension on 6/4. Darius Edouard attempted with GI, but unsuccessful after numerous attempts. S/p salvage TIPS on 6/4. Ongoing melena and dark red NGT output with stable Hb felt to represent prior bleeding. Low clinical concern for active GI bleeding.   - S/p salvage TIPS  - Hepatology following  - Daily MELLD labs (CMP, PT/INR)  - Maintain fibrinogen > 100 with cryo  - Home lasix , spironolactone  held d/t hypotension  - Continue rifaximin , reduce lactulose  to 20g BID    Acute on chronic liver failure - Ischemic hepatitis (improving)  LFTs continue to rise s/p variceal hemorrhage and TIPS. Unfortunately, precluded from transplant eval with newly reduced EF. LFTs slowly improving, suggesting ongoing recovery from TIPS and hemorrhagic event.  - Hepatology following    Malnutrition Assessment:   Tube feeds started 6/9  Tube feeds:   If norepi trending down/able to reduce to single pressor, start trickle feeds: Recommend Novasource Renal at goal rate 20 mL/hr. This provides 840 kcals, 40 g protein, 77 g carbohydrate, 43 g fat, 0 g fiber, 299 mL free water , and meets 42% USRDI.  Goal tube feeds (when norepi <10)  Recommend Novasource Renal at goal rate 30 mL/hr. This provides 1260 kcals, 59 g protein, 115 g carbohydrate, 64 g fat, 0 g fiber, 448 mL free water , and meets 63% USRDI.  Prosource TF 2 pkts BID   Total: 1500 kcal, 119 g protein       Heme/Coag   Blood loss anemia, likely 2/2 refractory portal hypertensive bleeding (stable)  S/p 3 units pRBCs at OSH, Hb 9.3 on arrival to MICU. S/p massive transfusion protocol on 6/4. Low clinical concern for ongoing blood loss with stable Hb, last transfusion 6/4.   - q8h CBC with diff    Mild Thrombocytopenia (stable)  Plts 253k on admission, acute decline to 50k on 6/5. Currently without signs of active bleeding. Plts remain stable around ~100k.  - q8h CBC with diff    Endocrine   Insulin -dependent T2DM  A1c 5.6 on admission. Home regimen lantus  60, lispro with meals. BG control challenging in the setting of critical illness and stress dose steroids. S/p insulin  infusion, converted to NPH 6/8.  - Increase NPH to 30 units BID, regular SSI q4h    Integumentary   Psoriasis (stable)  History of psoriasis per chart review. With numerous chronic appearing scattered erythematous plaques with silvery scale. Of note, on Secukinumab  for psoriasis management, which can increase risk for infection.    # Sacral wound, 01/2024  - WOCN consulted for high risk skin assessment Yes.  -  cont pressure mitigating precautions per skin policy    Prophylaxis/LDA/Restraints/Consults   ICU checklist completed: yes (see ICU rounding navigator in Epic)    Patient Lines/Drains/Airways Status       Active Active Lines, Drains, & Airways       Name Placement date Placement time Site Days    ETT  7.5 05/05/24  1810  -- 4    CVC Triple Lumen 05/05/24 Non-tunneled Left Internal jugular 05/05/24  1740  Internal jugular  4    NG/OG Tube Right nostril 05/06/24  1930  Right nostril  3    Urethral Catheter 05/04/24  0500  --  6    Peripheral IV 05/04/24 Anterior;Left;Upper Arm 05/04/24  0500  Arm  6    Peripheral IV 05/04/24 Anterior;Right;Upper Arm 05/04/24  0500  Arm  6    Peripheral IV 05/06/24 Right Arm 05/06/24  1716  Arm  3    Arterial Line 05/06/24 Right Radial 05/06/24  1716  Radial  3                  Patient Lines/Drains/Airways Status       Active Wounds       Name Placement date Placement time Site Days    Surgical Site 05/04/24 Abdomen Lower;Right 05/04/24  2300  -- 5    Surgical Site 05/04/24 Neck Right 05/04/24  2300  -- 5    Surgical Site 05/04/24 Groin Right 05/04/24  2300  -- 5                    Goals of Care     Code Status: Full Code    Designated Healthcare Decision Maker:  Ms. Bonano current decisional capacity for healthcare decision-making is Full capacity. Her designated healthcare decision maker(s) is/are   HCDM (patient stated preference): Caudle,Ryan - Relative - [phone].      Subjective     Patient lying in bed, intubated and sedated. More responsive today, opening eyes to voice, following commands.     Objective     Vitals - past 24 hours  Temp:  [36.8 ??C (98.2 ??F)-37.6 ??C (99.6 ??F)] 37.2 ??C (99 ??F)  Pulse:  [77-100] 92  SpO2 Pulse:  [77-100] 92  Resp:  [0-29] 15  FiO2 (%):  [30 %-35 %] 30 %  SpO2:  [89 %-99 %] 95 % Intake/Output  I/O last 3 completed shifts:  In: 2145.5 [P.O.:30; I.V.:1448.4; NG/GT:600; IV Piggyback:67.1]  Out: 2240 [Urine:1940; Stool:300]     Physical Exam:    General: Ill-appearing female, lying in bed. Intubated and sedated.   HEENT: Mucous membranes dry.  CV: Tachycardic. Regular rhythm.   Pulm: CTAB on anterior exam.  GI: Abd soft, nontender.  Extremities: Pulses weak but palpable. 1+ pitting edema to bilateral lower extremities.   Skin: Diffuse scattered erythematous plaques with silvery scale. No bruising, petechiae, mucocutaneous bleeding.  Neuro: Intubated and sedated. Does not respond to voice, touch, or withdraw to pain.    Continuous Infusions:    amiodarone  0.5 mg/min (05/10/24 0132)    NORepinephrine  bitartrate-NS 14 mcg/min (05/10/24 0155)    vasopressin  Stopped (05/10/24 0447)       Scheduled Medications:    heparin (porcine) for subcutaneous use  5,000 Units Subcutaneous Q12H SCH    insulin  NPH  20 Units Subcutaneous Q12H    insulin  regular  0-20 Units Subcutaneous ACHS    lactulose   20 g Enteral tube: gastric QID    levETIRAcetam   750  mg Intravenous Q12H [facility]    pantoprazole  (Protonix ) intravenous solution  40 mg Intravenous BID    rifAXIMin   550 mg Enteral tube: gastric BID    sodium chloride   10 mL Intravenous Q8H    sodium chloride   10 mL Intravenous Q8H    sodium chloride   10 mL Intravenous Q8H       PRN medications:  dextrose  in water , dextrose  in water , [Provider Hold] fentaNYL  (PF) **OR** [Provider Hold] fentaNYL  (PF), glucagon, glucose    Data/Imaging Review: Reviewed in Epic and personally interpreted on 05/10/2024. See EMR for detailed results.    Anissa Powell, MS4

## 2024-05-10 NOTE — Unmapped (Signed)
 Arterial Line Insertion Procedure Note     Date of Service: 05/10/2024    Patient Name:: Ashley Briggs  Patient MRN: [id number]    Indications: Hemodynamic monitoring    Consent:   I explained the potential benefits and risks of the procedure, including  temporary vascular occlusion, thrombosis, ischemia, hematoma formation, local/catheter-related infection and nerve/tissue damage. I explained potential alternatives. The patient/HCDM understands these risks, agrees to the procedure, and signed the informed consent form.    Procedure Details:   Time-out was performed immediately prior to the procedure to verify correct patient, procedure, site, positioning, and special equipment if applicable.    Leighton Punches???s test was performed to ensure adequate perfusion. The patient???s right wrist was prepped and draped in sterile fashion. 1% Lidocaine  was used to anesthetize the area. A 20 G Arrow line was introduced into the radial artery with ultrasound guidance. The catheter was threaded over the guide wire and the needle was removed with appropriate pulsatile blood return. The catheter was then sutured in place to the skin and a sterile CHG dressing applied. Perfusion to the extremity distal to the point of catheter insertion was checked and found to be adequate.  A pressure transducer was connected sterilely to the arterial line and an arterial line waveform was noted on the monitor.     Estimated Blood Loss: 2 ml    Condition:  The patient tolerated the procedure well and remains in the same condition as pre-procedure.    Complications:  The patient tolerated the procedure well and there were no complications.    Plan:  Monitor BP    Maudine Sos, ACNP

## 2024-05-10 NOTE — Unmapped (Signed)
 Problem: Adult Inpatient Plan of Care  Goal: Plan of Care Review  Outcome: Ongoing - Unchanged  Goal: Patient-Specific Goal (Individualized)  Outcome: Ongoing - Unchanged  Goal: Absence of Hospital-Acquired Illness or Injury  Outcome: Ongoing - Unchanged  Intervention: Identify and Manage Fall Risk  Recent Flowsheet Documentation  Taken 05/10/2024 0800 by Author Board, RN  Safety Interventions:   aspiration precautions   bed alarm   environmental modification   fall reduction program maintained   lighting adjusted for tasks/safety   low bed   muscle strengthening facilitated   nonskid shoes/slippers when out of bed  Intervention: Prevent Skin Injury  Recent Flowsheet Documentation  Taken 05/10/2024 1600 by Author Board, RN  Positioning for Skin: Left  Taken 05/10/2024 1400 by Author Board, RN  Positioning for Skin: Right  Taken 05/10/2024 1200 by Author Board, RN  Positioning for Skin: Left  Taken 05/10/2024 1000 by Author Board, RN  Positioning for Skin: Right  Taken 05/10/2024 0800 by Author Board, RN  Positioning for Skin: Left  Device Skin Pressure Protection:   absorbent pad utilized/changed   skin-to-device areas padded   pressure points protected   positioning supports utilized  Skin Protection:   adhesive use limited   incontinence pads utilized   zinc oxide barrier cream   tubing/devices free from skin contact  Intervention: Prevent Infection  Recent Flowsheet Documentation  Taken 05/10/2024 0800 by Author Board, RN  Infection Prevention:   environmental surveillance performed   hand hygiene promoted   rest/sleep promoted   single patient room provided  Goal: Optimal Comfort and Wellbeing  Outcome: Ongoing - Unchanged  Goal: Readiness for Transition of Care  Outcome: Ongoing - Unchanged  Goal: Rounds/Family Conference  Outcome: Ongoing - Unchanged     Problem: Skin Injury Risk Increased  Goal: Skin Health and Integrity  Outcome: Ongoing - Unchanged  Intervention: Optimize Skin Protection  Recent Flowsheet Documentation  Taken 05/10/2024 1600 by Author Board, RN  Activity Management: bedrest  Pressure Reduction Techniques:   frequent weight shift encouraged   positioned off wounds   weight shift assistance provided  Head of Bed [facility]) Positioning: HOB at 30 degrees  Taken 05/10/2024 1400 by Author Board, RN  Activity Management: bedrest  Pressure Reduction Techniques:   frequent weight shift encouraged   heels elevated off bed   weight shift assistance provided  Head of Bed [facility]) Positioning: HOB at 30 degrees  Taken 05/10/2024 1200 by Author Board, RN  Activity Management: bedrest  Pressure Reduction Techniques:   frequent weight shift encouraged   heels elevated off bed   weight shift assistance provided  Head of Bed [facility]) Positioning: HOB at 30 degrees  Taken 05/10/2024 1000 by Author Board, RN  Activity Management: bedrest  Pressure Reduction Techniques:   frequent weight shift encouraged   heels elevated off bed   weight shift assistance provided  Head of Bed [facility]) Positioning: HOB at 30 degrees  Taken 05/10/2024 0800 by Author Board, RN  Activity Management: bedrest  Pressure Reduction Techniques:   heels elevated off bed   frequent weight shift encouraged   weight shift assistance provided  Head of Bed (HOB) Positioning: HOB at 30 degrees  Pressure Reduction Devices:   pressure-redistributing mattress utilized   specialty bed utilized  Skin Protection:   adhesive use limited   incontinence pads utilized   zinc oxide barrier cream   tubing/devices free from skin contact     Problem: Fall Injury Risk  Goal: Absence of Fall  and Fall-Related Injury  Outcome: Ongoing - Unchanged  Intervention: Promote Injury-Free Environment  Recent Flowsheet Documentation  Taken 05/10/2024 0800 by Author Board, RN  Safety Interventions:   aspiration precautions   bed alarm   environmental modification   fall reduction program maintained   lighting adjusted for tasks/safety   low bed   muscle strengthening facilitated   nonskid shoes/slippers when out of bed     Problem: Non-Violent Restraints  Goal: Patient will remain free of restraint events  Outcome: Ongoing - Unchanged  Goal: Patient will remain free of physical injury  Outcome: Ongoing - Unchanged     Problem: Self-Care Deficit  Goal: Improved Ability to Complete Activities of Daily Living  Outcome: Ongoing - Unchanged     Problem: Mechanical Ventilation Invasive  Goal: Effective Communication  Outcome: Ongoing - Unchanged  Goal: Optimal Device Function  Outcome: Ongoing - Unchanged  Goal: Mechanical Ventilation Liberation  Outcome: Ongoing - Unchanged  Intervention: Promote Extubation and Mechanical Ventilation Liberation  Recent Flowsheet Documentation  Taken 05/10/2024 0800 by Author Board, RN  Sleep/Rest Enhancement:   regular sleep/rest pattern promoted   relaxation techniques promoted   room darkened   noise level reduced   awakenings minimized  Goal: Optimal Nutrition Delivery  Outcome: Ongoing - Unchanged  Goal: Absence of Device-Related Skin and Tissue Injury  Outcome: Ongoing - Unchanged  Intervention: Maintain Skin and Tissue Health  Recent Flowsheet Documentation  Taken 05/10/2024 0800 by Author Board, RN  Device Skin Pressure Protection:   absorbent pad utilized/changed   skin-to-device areas padded   pressure points protected   positioning supports utilized  Goal: Absence of Ventilator-Induced Lung Injury  Outcome: Ongoing - Unchanged  Intervention: Prevent Ventilator-Associated Pneumonia  Recent Flowsheet Documentation  Taken 05/10/2024 1600 by Author Board, RN  Head of Bed [facility]) Positioning: HOB at 30 degrees  Taken 05/10/2024 1400 by Author Board, RN  Head of Bed [facility]) Positioning: HOB at 30 degrees  Taken 05/10/2024 1200 by Author Board, RN  Head of Bed [facility]) Positioning: HOB at 30 degrees  Taken 05/10/2024 1000 by Author Board, RN  Head of Bed [facility]) Positioning: HOB at 30 degrees  Taken 05/10/2024 0800 by Glorious Flicker, RN  Head of Bed (HOB) Positioning: HOB at 30 degrees

## 2024-05-11 LAB — CBC W/ AUTO DIFF
BASOPHILS ABSOLUTE COUNT: 0 10*9/L (ref 0.0–0.1)
BASOPHILS ABSOLUTE COUNT: 0 10*9/L (ref 0.0–0.1)
BASOPHILS ABSOLUTE COUNT: 0.1 10*9/L (ref 0.0–0.1)
BASOPHILS RELATIVE PERCENT: 0.2 %
BASOPHILS RELATIVE PERCENT: 0.1 %
BASOPHILS RELATIVE PERCENT: 0.9 %
EOSINOPHILS ABSOLUTE COUNT: 0.1 10*9/L (ref 0.0–0.5)
EOSINOPHILS ABSOLUTE COUNT: 0.1 10*9/L (ref 0.0–0.5)
EOSINOPHILS ABSOLUTE COUNT: 0.1 10*9/L (ref 0.0–0.5)
EOSINOPHILS RELATIVE PERCENT: 1.1 %
EOSINOPHILS RELATIVE PERCENT: 1.2 %
EOSINOPHILS RELATIVE PERCENT: 1.3 %
HEMATOCRIT: 28.4 % — ABNORMAL LOW (ref 34.0–44.0)
HEMATOCRIT: 28.5 % — ABNORMAL LOW (ref 34.0–44.0)
HEMATOCRIT: 26.8 % — ABNORMAL LOW (ref 34.0–44.0)
HEMOGLOBIN: 9.4 g/dL — ABNORMAL LOW (ref 11.3–14.9)
HEMOGLOBIN: 9.4 g/dL — ABNORMAL LOW (ref 11.3–14.9)
HEMOGLOBIN: 9.3 g/dL — ABNORMAL LOW (ref 11.3–14.9)
LYMPHOCYTES ABSOLUTE COUNT: 0.8 10*9/L — ABNORMAL LOW (ref 1.1–3.6)
LYMPHOCYTES ABSOLUTE COUNT: 0.8 10*9/L — ABNORMAL LOW (ref 1.1–3.6)
LYMPHOCYTES ABSOLUTE COUNT: 0.8 10*9/L — ABNORMAL LOW (ref 1.1–3.6)
LYMPHOCYTES RELATIVE PERCENT: 9.3 %
LYMPHOCYTES RELATIVE PERCENT: 8.5 %
LYMPHOCYTES RELATIVE PERCENT: 7.9 %
MEAN CORPUSCULAR HEMOGLOBIN CONC: 33.2 g/dL (ref 32.0–36.0)
MEAN CORPUSCULAR HEMOGLOBIN CONC: 33.1 g/dL (ref 32.0–36.0)
MEAN CORPUSCULAR HEMOGLOBIN CONC: 34.5 g/dL (ref 32.0–36.0)
MEAN CORPUSCULAR HEMOGLOBIN: 29.5 pg (ref 25.9–32.4)
MEAN CORPUSCULAR HEMOGLOBIN: 29.4 pg (ref 25.9–32.4)
MEAN CORPUSCULAR HEMOGLOBIN: 30.1 pg (ref 25.9–32.4)
MEAN CORPUSCULAR VOLUME: 89 fL (ref 77.6–95.7)
MEAN CORPUSCULAR VOLUME: 88.7 fL (ref 77.6–95.7)
MEAN CORPUSCULAR VOLUME: 87.2 fL (ref 77.6–95.7)
MEAN PLATELET VOLUME: 8.3 fL (ref 6.8–10.7)
MEAN PLATELET VOLUME: 8.3 fL (ref 6.8–10.7)
MEAN PLATELET VOLUME: 8.7 fL (ref 6.8–10.7)
MONOCYTES ABSOLUTE COUNT: 1.7 10*9/L — ABNORMAL HIGH (ref 0.3–0.8)
MONOCYTES ABSOLUTE COUNT: 1.4 10*9/L — ABNORMAL HIGH (ref 0.3–0.8)
MONOCYTES ABSOLUTE COUNT: 1.3 10*9/L — ABNORMAL HIGH (ref 0.3–0.8)
MONOCYTES RELATIVE PERCENT: 18.7 %
MONOCYTES RELATIVE PERCENT: 15.9 %
MONOCYTES RELATIVE PERCENT: 12.8 %
NEUTROPHILS ABSOLUTE COUNT: 6.4 10*9/L (ref 1.8–7.8)
NEUTROPHILS ABSOLUTE COUNT: 6.7 10*9/L (ref 1.8–7.8)
NEUTROPHILS ABSOLUTE COUNT: 8 10*9/L — ABNORMAL HIGH (ref 1.8–7.8)
NEUTROPHILS RELATIVE PERCENT: 70.7 %
NEUTROPHILS RELATIVE PERCENT: 74.3 %
NEUTROPHILS RELATIVE PERCENT: 77.1 %
PLATELET COUNT: 88 10*9/L — ABNORMAL LOW (ref 150–450)
PLATELET COUNT: 86 10*9/L — ABNORMAL LOW (ref 150–450)
PLATELET COUNT: 85 10*9/L — ABNORMAL LOW (ref 150–450)
RED BLOOD CELL COUNT: 3.19 10*12/L — ABNORMAL LOW (ref 3.95–5.13)
RED BLOOD CELL COUNT: 3.21 10*12/L — ABNORMAL LOW (ref 3.95–5.13)
RED BLOOD CELL COUNT: 3.08 10*12/L — ABNORMAL LOW (ref 3.95–5.13)
RED CELL DISTRIBUTION WIDTH: 19 % — ABNORMAL HIGH (ref 12.2–15.2)
RED CELL DISTRIBUTION WIDTH: 19 % — ABNORMAL HIGH (ref 12.2–15.2)
RED CELL DISTRIBUTION WIDTH: 18.9 % — ABNORMAL HIGH (ref 12.2–15.2)
WBC ADJUSTED: 9 10*9/L (ref 3.6–11.2)
WBC ADJUSTED: 9 10*9/L (ref 3.6–11.2)
WBC ADJUSTED: 10.4 10*9/L (ref 3.6–11.2)

## 2024-05-11 LAB — COMPREHENSIVE METABOLIC PANEL
ALBUMIN: 2.9 g/dL — ABNORMAL LOW (ref 3.4–5.0)
ALBUMIN: 2.8 g/dL — ABNORMAL LOW (ref 3.4–5.0)
ALBUMIN: 2.7 g/dL — ABNORMAL LOW (ref 3.4–5.0)
ALKALINE PHOSPHATASE: 92 U/L (ref 46–116)
ALKALINE PHOSPHATASE: 97 U/L (ref 46–116)
ALKALINE PHOSPHATASE: 91 U/L (ref 46–116)
ALT (SGPT): 289 U/L — ABNORMAL HIGH (ref 10–49)
ALT (SGPT): 256 U/L — ABNORMAL HIGH (ref 10–49)
ALT (SGPT): 223 U/L — ABNORMAL HIGH (ref 10–49)
ANION GAP: 12 mmol/L (ref 5–14)
ANION GAP: 8 mmol/L (ref 5–14)
ANION GAP: 10 mmol/L (ref 5–14)
AST (SGOT): 210 U/L — ABNORMAL HIGH (ref <=34)
AST (SGOT): 168 U/L — ABNORMAL HIGH (ref <=34)
AST (SGOT): 143 U/L — ABNORMAL HIGH (ref <=34)
BILIRUBIN TOTAL: 11.5 mg/dL — ABNORMAL HIGH (ref 0.3–1.2)
BILIRUBIN TOTAL: 12.4 mg/dL — ABNORMAL HIGH (ref 0.3–1.2)
BILIRUBIN TOTAL: 13.4 mg/dL — ABNORMAL HIGH (ref 0.3–1.2)
BLOOD UREA NITROGEN: 80 mg/dL — ABNORMAL HIGH (ref 9–23)
BLOOD UREA NITROGEN: 76 mg/dL — ABNORMAL HIGH (ref 9–23)
BLOOD UREA NITROGEN: 73 mg/dL — ABNORMAL HIGH (ref 9–23)
BUN / CREAT RATIO: 58
BUN / CREAT RATIO: 62
BUN / CREAT RATIO: 71
CALCIUM: 9.3 mg/dL (ref 8.7–10.4)
CALCIUM: 9.5 mg/dL (ref 8.7–10.4)
CALCIUM: 9.8 mg/dL (ref 8.7–10.4)
CHLORIDE: 108 mmol/L — ABNORMAL HIGH (ref 98–107)
CHLORIDE: 111 mmol/L — ABNORMAL HIGH (ref 98–107)
CHLORIDE: 111 mmol/L — ABNORMAL HIGH (ref 98–107)
CO2: 25 mmol/L (ref 20.0–31.0)
CO2: 25 mmol/L (ref 20.0–31.0)
CO2: 24 mmol/L (ref 20.0–31.0)
CREATININE: 1.37 mg/dL — ABNORMAL HIGH (ref 0.55–1.02)
CREATININE: 1.22 mg/dL — ABNORMAL HIGH (ref 0.55–1.02)
CREATININE: 1.03 mg/dL — ABNORMAL HIGH (ref 0.55–1.02)
EGFR CKD-EPI (2021) FEMALE: 45 mL/min/1.73m2 — ABNORMAL LOW (ref >=60)
EGFR CKD-EPI (2021) FEMALE: 51 mL/min/1.73m2 — ABNORMAL LOW (ref >=60)
EGFR CKD-EPI (2021) FEMALE: 63 mL/min/1.73m2 (ref >=60)
GLUCOSE RANDOM: 261 mg/dL — ABNORMAL HIGH (ref 70–179)
GLUCOSE RANDOM: 278 mg/dL — ABNORMAL HIGH (ref 70–179)
GLUCOSE RANDOM: 279 mg/dL — ABNORMAL HIGH (ref 70–179)
POTASSIUM: 3.3 mmol/L — ABNORMAL LOW (ref 3.4–4.8)
POTASSIUM: 3.9 mmol/L (ref 3.4–4.8)
POTASSIUM: 3.8 mmol/L (ref 3.4–4.8)
PROTEIN TOTAL: 5.8 g/dL (ref 5.7–8.2)
PROTEIN TOTAL: 5.5 g/dL — ABNORMAL LOW (ref 5.7–8.2)
PROTEIN TOTAL: 5.6 g/dL — ABNORMAL LOW (ref 5.7–8.2)
SODIUM: 145 mmol/L (ref 135–145)
SODIUM: 144 mmol/L (ref 135–145)
SODIUM: 145 mmol/L (ref 135–145)

## 2024-05-11 LAB — MAGNESIUM
MAGNESIUM: 2.7 mg/dL — ABNORMAL HIGH (ref 1.6–2.6)
MAGNESIUM: 2.6 mg/dL (ref 1.6–2.6)
MAGNESIUM: 2.7 mg/dL — ABNORMAL HIGH (ref 1.6–2.6)

## 2024-05-11 LAB — BLOOD GAS CRITICAL CARE PANEL, ARTERIAL
BASE EXCESS ARTERIAL: 1.9 (ref -2.0–2.0)
CALCIUM IONIZED ARTERIAL (MG/DL): 5.21 mg/dL (ref 4.40–5.40)
GLUCOSE WHOLE BLOOD: 269 mg/dL — ABNORMAL HIGH (ref 70–179)
HCO3 ARTERIAL: 26 mmol/L (ref 22–27)
HEMOGLOBIN BLOOD GAS: 9.3 g/dL — ABNORMAL LOW (ref 12.00–16.00)
LACTATE BLOOD ARTERIAL: 2 mmol/L — ABNORMAL HIGH (ref <1.3)
O2 SATURATION ARTERIAL: 99.4 % (ref 94.0–100.0)
PCO2 ARTERIAL: 39.6 mmHg (ref 35.0–45.0)
PH ARTERIAL: 7.43 (ref 7.35–7.45)
PO2 ARTERIAL: 120 mmHg — ABNORMAL HIGH (ref 80.0–110.0)
POTASSIUM WHOLE BLOOD: 3.8 mmol/L (ref 3.4–4.6)
SODIUM WHOLE BLOOD: 145 mmol/L (ref 135–145)

## 2024-05-11 LAB — BLOOD GAS, ARTERIAL
BASE EXCESS ARTERIAL: -0.4 (ref -2.0–2.0)
BASE EXCESS ARTERIAL: 0.5 (ref -2.0–2.0)
HCO3 ARTERIAL: 24 mmol/L (ref 22–27)
HCO3 ARTERIAL: 25 mmol/L (ref 22–27)
O2 SATURATION ARTERIAL: 98.8 % (ref 94.0–100.0)
O2 SATURATION ARTERIAL: 99.6 % (ref 94.0–100.0)
PCO2 ARTERIAL: 40.9 mmHg (ref 35.0–45.0)
PCO2 ARTERIAL: 40.4 mmHg (ref 35.0–45.0)
PH ARTERIAL: 7.39 (ref 7.35–7.45)
PH ARTERIAL: 7.4 (ref 7.35–7.45)
PO2 ARTERIAL: 111 mmHg — ABNORMAL HIGH (ref 80.0–110.0)
PO2 ARTERIAL: 126 mmHg — ABNORMAL HIGH (ref 80.0–110.0)

## 2024-05-11 LAB — PROTIME-INR
INR: 1.35
INR: 1.46
PROTIME: 15.4 s — ABNORMAL HIGH (ref 9.9–12.6)
PROTIME: 16.6 s — ABNORMAL HIGH (ref 9.9–12.6)

## 2024-05-11 LAB — FIBRINOGEN: FIBRINOGEN LEVEL: 125 mg/dL — ABNORMAL LOW (ref 175–500)

## 2024-05-11 LAB — PHOSPHORUS: PHOSPHORUS: 2.2 mg/dL — ABNORMAL LOW (ref 2.4–5.1)

## 2024-05-11 MED ADMIN — midodrine (PROAMATINE) tablet 5 mg: 5 mg | ORAL | @ 12:00:00

## 2024-05-11 MED ADMIN — levETIRAcetam (KEPPRA) injection 750 mg: 750 mg | INTRAVENOUS | @ 12:00:00 | Stop: 2024-05-11

## 2024-05-11 MED ADMIN — sodium chloride (NS) 0.9 % flush 10 mL: 10 mL | INTRAVENOUS | @ 15:00:00

## 2024-05-11 MED ADMIN — insulin regular (HumuLIN,NovoLIN) inj CORRECTIONAL 0-20 Units: 0-20 [IU] | SUBCUTANEOUS | @ 18:00:00

## 2024-05-11 MED ADMIN — insulin regular (HumuLIN,NovoLIN) inj CORRECTIONAL 0-20 Units: 0-20 [IU] | SUBCUTANEOUS | @ 12:00:00

## 2024-05-11 MED ADMIN — insulin regular (HumuLIN,NovoLIN) injection 10 Units: 10 [IU] | SUBCUTANEOUS | @ 21:00:00

## 2024-05-11 MED ADMIN — potassium chloride 20 mEq in 100 mL IVPB Premix: 20 meq | INTRAVENOUS | @ 14:00:00 | Stop: 2024-05-11

## 2024-05-11 MED ADMIN — insulin NPH (HumuLIN,NovoLIN) injection 42 Units: 42 [IU] | SUBCUTANEOUS | @ 21:00:00

## 2024-05-11 MED ADMIN — lactulose oral solution: 20 g | GASTROENTERAL | @ 01:00:00

## 2024-05-11 MED ADMIN — insulin NPH (HumuLIN,NovoLIN) injection 36 Units: 36 [IU] | SUBCUTANEOUS | @ 10:00:00 | Stop: 2024-05-11

## 2024-05-11 MED ADMIN — sodium chloride (NS) 0.9 % flush 10 mL: 10 mL | INTRAVENOUS | @ 09:00:00

## 2024-05-11 MED ADMIN — potassium chloride (KLOR-CON) packet 20 mEq: 20 meq | ORAL | @ 10:00:00 | Stop: 2024-05-11

## 2024-05-11 MED ADMIN — heparin (porcine) 5,000 unit/mL injection 5,000 Units: 5000 [IU] | SUBCUTANEOUS | @ 01:00:00

## 2024-05-11 MED ADMIN — multivitamins, therapeutic with minerals tablet 1 tablet: 1 | GASTROENTERAL | @ 12:00:00

## 2024-05-11 MED ADMIN — midodrine (PROAMATINE) tablet 5 mg: 5 mg | ORAL | @ 17:00:00

## 2024-05-11 MED ADMIN — levETIRAcetam (KEPPRA) injection 750 mg: 750 mg | INTRAVENOUS | @ 01:00:00

## 2024-05-11 MED ADMIN — lactulose oral solution: 20 g | GASTROENTERAL | @ 12:00:00

## 2024-05-11 MED ADMIN — potassium chloride 20 mEq in 100 mL IVPB Premix: 20 meq | INTRAVENOUS | @ 12:00:00 | Stop: 2024-05-11

## 2024-05-11 MED ADMIN — pantoprazole (Protonix) injection 40 mg: 40 mg | INTRAVENOUS | @ 12:00:00 | Stop: 2024-05-11

## 2024-05-11 MED ADMIN — midodrine (PROAMATINE) tablet 5 mg: 5 mg | ORAL | @ 21:00:00

## 2024-05-11 MED ADMIN — insulin regular (HumuLIN,NovoLIN) inj CORRECTIONAL 0-20 Units: 0-20 [IU] | SUBCUTANEOUS | @ 21:00:00

## 2024-05-11 MED ADMIN — insulin regular (HumuLIN,NovoLIN) inj CORRECTIONAL 0-20 Units: 0-20 [IU] | SUBCUTANEOUS | @ 16:00:00

## 2024-05-11 MED ADMIN — insulin regular (HumuLIN,NovoLIN) inj CORRECTIONAL 0-20 Units: 0-20 [IU] | SUBCUTANEOUS | @ 04:00:00

## 2024-05-11 MED ADMIN — heparin (porcine) 5,000 unit/mL injection 5,000 Units: 5000 [IU] | SUBCUTANEOUS | @ 12:00:00

## 2024-05-11 MED ADMIN — insulin regular (HumuLIN,NovoLIN) inj CORRECTIONAL 0-20 Units: 0-20 [IU] | SUBCUTANEOUS | @ 01:00:00

## 2024-05-11 MED ADMIN — insulin regular (HumuLIN,NovoLIN) inj CORRECTIONAL 0-20 Units: 0-20 [IU] | SUBCUTANEOUS | @ 09:00:00

## 2024-05-11 MED ADMIN — sodium chloride (NS) 0.9 % flush 10 mL: 10 mL | INTRAVENOUS

## 2024-05-11 MED ADMIN — NORepinephrine 8 mg in dextrose 5 % 250 mL (32 mcg/mL) infusion PMB: 0-40 ug/min | INTRAVENOUS | @ 10:00:00 | Stop: 2024-05-11

## 2024-05-11 MED ADMIN — amiodarone (PACERONE) tablet 400 mg: 400 mg | GASTROENTERAL | @ 16:00:00 | Stop: 2024-05-12

## 2024-05-11 MED ADMIN — potassium chloride 20 mEq in 100 mL IVPB Premix: 20 meq | INTRAVENOUS | @ 10:00:00 | Stop: 2024-05-11

## 2024-05-11 MED ADMIN — pantoprazole (Protonix) injection 40 mg: 40 mg | INTRAVENOUS | @ 01:00:00

## 2024-05-11 MED ADMIN — amiodarone (PACERONE) tablet 400 mg: 400 mg | GASTROENTERAL | @ 10:00:00 | Stop: 2024-05-12

## 2024-05-11 MED ADMIN — amiodarone (PACERONE) tablet 400 mg: 400 mg | GASTROENTERAL | @ 22:00:00 | Stop: 2024-05-12

## 2024-05-11 MED ADMIN — insulin regular (HumuLIN,NovoLIN) injection 10 Units: 10 [IU] | SUBCUTANEOUS | @ 10:00:00

## 2024-05-11 NOTE — Unmapped (Signed)
 PHYSICAL THERAPY  Evaluation (05/11/24 1002)          Patient Name:  Ashley Briggs       Medical Record Number: [id number]   Date of Birth: 1966/02/28  Sex: Female        Post-Discharge Physical Therapy Recommendations:  PT Post Acute Discharge Recommendations: 5x weekly, Low intensity   Equipment Recommendation  PT DME Recommendations: Defer to post acute          Treatment Diagnosis: Abnormalities of gait and mobility, Unsteadiness on feet, Generalized muscle weakness        Activity Tolerance: Limited secondary to medical complications, Limited by fatigue     ASSESSMENT  Problem List: Impaired balance, Dizziness/vertigo, Decreased mobility, Gait deviation, Fall risk, Decreased endurance, Pain, Decreased range of motion, Decreased strength, Impaired vision      Assessment : 58 y.o. female with PMH CAD s/p NSTEMI, CKD, NAS cirrhosis c/b ascites, hepatic encephalopathy and EV s/p recent banding admitted with UGI bleed 2/2 bleeding esophageal ulcers s/p unsusccessful TIPS c/b pulsatile VT s/p shock requiring transfer to [facility] ICU. Course complicated by severe hemorrhagic shock 2/2 gastric bleed now s/p TIPS with course complicated by acute hypoxic respiratory failure, AKI, stress cardiomyopathy and hepatic encephalopathy. Now with slight increase in pressor requirement. Weaning sedation to plan for extubation in the near future.     Pt presents with deconditioning and weakness requiring assistance for all mobility. Pt able to consistently communicate with head nod/shakes for yes/no despite intubation. Pt following 100% of 1 step motor commands given increased time. Pt with fatigues quickly with less than normal activity but participatory with all therapeutic activities. She was able to sit EOB with minA however has anterior lean and tolerated standing 2x but required maxA x2ppl. Pt significantly below her reported baseline and would benefit from skilled PT while inpatient and recommend 5x/week of low intensity PT post discharge.      Today's Interventions: Balance activities, Patient/Family/Caregiver Education, Endurance activities, Therapeutic activity  Today's Interventions: PT eval, upright tolerance with vitals monitoring, sitting balance, transfers. Educated re: role of PT & POC     Personal Factors/Comorbidities Present: 3+   Examination of Body System: Pulmonary, Activity/participation, Musculoskeletal, Neurological  Clinical Presentation: Evolving    Clinical Decision Making: Moderate        PLAN  Planned Frequency of Treatment: Plan of Care Initiated: 05/11/24  1-2x per day Weekly Frequency: 4-5 days per week  Planned Treatment Duration: 05/25/24     Planned Interventions: Education (Patient/Family/Caregiver), Gait training, Home exercise program, Neuromuscular re-education, Therapeutic Activity, Therapeutic Exercise, Self-care / Home Management training     Goals:   Patient and Family Goals: get better     SHORT GOAL #1: Pt will complete bed mobility with minA               Time Frame : 2 weeks  SHORT GOAL #2: Pt will complete sit/stand transfers with minA              Time Frame : 2 weeks  SHORT GOAL #3: Pt will be able to ambulate 61ft with minA and LRAD              Time Frame : 2 weeks                       Long Term Goal #1: Pt will ambulate 141ft mod I with LRAD  Time Frame: 4 weeks     Prognosis:  Good     Barriers to Discharge: Severity of deficits, Functional strength deficits, Impaired Balance, Gait instability, Cognitive deficits, Endurance deficits, Inability to safely perform ADLS     SUBJECTIVE  Communication Preference: Verbal     Patient reports: Pt agreeable to PT, able to nod/shake head for communication  Pain Comments: denied pain        Prior Functional Status: Pt endorses being independent with mobility and ADLs; family assisting with iADLs and driving. Per previous PT note, pt used [facility]. Home setup below from previous admission; will need to clarify.  Living Situation  Living Environment: Trailer/Mobile home  Home Living: Stairs to enter with rails, Walk-in shower, Standard height toilet, One level home, Hand-held shower hose  Rail placement (outside): Bilateral rails in reach  Number of Stairs to Enter (outside): 5      Equipment available at home: Straight cane        Past Medical History[1]         Social History     Tobacco Use    Smoking status: Never     Passive exposure: Current    Smokeless tobacco: Never   Substance Use Topics    Alcohol use: Not Currently     Comment: No longer       Past Surgical History[2]          Family History[3]     Allergies: Patient has no known allergies.                  Objective Findings  Precautions / Restrictions  Precautions: Falls precautions  Weight Bearing Status: Non-applicable  Required Braces or Orthoses: Non-applicable     Medical Tests / Procedures: labs, vitals, imaging reviewed  Equipment / Environment: Vascular access (PIV, TLC, Port-a-cath, PICC), Ventilatory support, Supplemental oxygen, Arterial line, Telemetry, Foley, Rectal bag, NGT     Vitals/Orthostatics : VSS on vent, PSV 30%FiO2/PEEP 5     Cognition: Follows 1-step commands  Cognition comment: Oriented to self, hospital, June given multiple options  Visual/Perception:  (unable to assess)  Hearing: unable to assess, no deficits noted     Skin Inspection: Swelling, Redness  Skin Inspection comment: redness/rash like on stomach/back     Upper Extremities  UE ROM: Right WFL, Left WFL  UE Strength: Left WFL, Right WFL  UE comment: grossly 3/5    Lower Extremities  LE Strength: Right Impaired/Limited, Left Impaired/Limited  RLE Strength Impairment: Reduced strength  LLE Strength Impairment: Reduced strength  LE comment: hip flexion 3-/5, knee flexion/ext 3/5          Coordination: Not tested  Sensation: Not tested    Static Sitting-Level of Assistance: Minimum assistance, Moderate assistance  Dynamic Sitting-Level of Assistance: Moderate assistance  Sitting Balance comments: anterior lean, able to posteriorly weightshifting witih vc's & minA. Maintains briefly with minA but then reverts to anterior lean.    Standing Balance comments: maxA x2ppl with bilateral knee block, flexed trunk, not yet able to fully extend pelvis/trunk      Bed Mobility: Supine to Sit  Supine to Sit assistance level: Maximum assist, patient does 25-49%  Bed Mobility comments: sup>sit: maxA x2ppl. sit>sup: total A rolling: maxA     Transfers: Sit to Stand  Sit to Stand assistance level: Maximum assist, patient does 25-49%, +2 assist      Skilled Treatment Performed: not yet able to take steps      Endurance: decreased    Patient at end of session: All needs  in reach, In bed, Lines intact, Notified Nurse    Physical Therapy Session Duration  PT Individual [mins]: 10  PT Co-Treatment [mins]: 16  Reason for Co-treatment: To safely progress mobility (Coevaluation completed with [place], OT)          AM-PAC-6 click  Help currently need turning over In bed?: A lot - Maximum/Moderate Assistance  Help currently needed sitting down/standing up from chair with arms? : Unable to do/total assistance - Total Dependent Assist  Help currently needed moving from supine to sitting on edge of bed?: Unable to do/total assistance - Total Dependent Assist  Help currently needed moving to and from bed from wheelchair?: Unable to do/total assistance - Total Dependent Assist  Help currently needed walking in a hospital room?: Unable to do/total assistance - Total Dependent Assist  Help currently needed climbing 3-5 steps with railing?: Unable to do/total assistance - Total Dependent Assist    Basic Mobility Score 6 click: 7    6 click Score (in points): % of Functional Impairment, Limitation, Restriction  6: 100% impaired, limited, restricted  7-8: At least 80%, but less than 100% impaired, limited restricted  9-13: At least 60%, but less than 80% impaired, limited restricted  14-19: At least 40%, but less than 60% impaired, limited restricted  20-22: At least 20%, but less than 40% impaired, limited restricted  23: At least 1%, but less than 20% impaired, limited restricted  24: 0% impaired, limited restricted    'AM-PAC' forms are Copyright protected by The [employer] of [facility]         I attest that I have reviewed the above information.  Signed: [place], PT  Filed 05/11/2024          [1]   Past Medical History:  Diagnosis Date    Anemia     Angular blepharoconjunctivitis of both eyes 12/21/2020    Anxiety     Arthritis     Ascites     Blepharitis     Calcified cerebral meningioma    08/16/2022    Cataract associated with type 2 diabetes mellitus    12/21/2020    Chronic headaches     Chronic pain     Chronic pain disorder 2029    I have multiple issues. Seeing doctor's regarding  issues    Cirrhosis        Coronary artery disease involving native heart without angina pectoris 02/05/2021    Current moderate episode of major depressive disorder (CMS-HCC) 11/06/2022    Diabetes mellitus        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 type 2 diabetes mellitus    12/02/2022    Dry eye syndrome, bilateral 12/21/2020    Dysphagia 02/02/2023    Endometrial cancer    2007    Esophageal varices in cirrhosis    06/17/2022    Fractures 01/29/2018    Gastroparesis     Generalized pruritus 02/02/2023    GERD (gastroesophageal reflux disease) It's  been going on for years    I take medication  which helps tremendously    Glaucoma 2023    I see a doctor on a regular basis    H/O diabetic gastroparesis 01/09/2021    Heart disease     Hemorrhage of rectum and anus 03/17/2011    Hepatic cirrhosis    06/17/2022    Hepatic encephalopathy    06/17/2022    Hepatic steatosis 03/13/2021  Noted on CT Fall 2021    High cholesterol     History of encephalopathy     Hx of psoriasis 08/06/2022    Hyperlipidemia 12/12/2015    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    Liver disease     Major depression     Metabolic dysfunction-associated steatohepatitis (MASH)     Morbid obesity (CMS-HCC) 11/10/2019    Myopia of both eyes with astigmatism and presbyopia 12/02/2022    Nausea 09/22/2022    Non-alcoholic fatty liver disease     NPDR (nonproliferative diabetic retinopathy)        Right eye    Other ascites 09/22/2022    Other chronic pain 11/06/2022    Other insomnia 11/06/2022    PFD (pelvic floor dysfunction) 04/07/2013    Portal hypertension    10/08/2022    Psoriasis     Ptosis of eyelid, left     Left upper eyelid    Reflux     Retinopathy of left eye, background, proliferative 01/09/2021    Right upper quadrant abdominal pain 09/22/2022    Secondary esophageal varices without bleeding    06/17/2022    Type 2 diabetes mellitus, with long-term current use of insulin     12/12/2015    Last Assessment & Plan:   The current medical regimen is effective;  continue present plan and medications.  Patient will continue good management of diabetes especially with weight loss. Hopefully with further weight loss will run into the problem of being overmedicated and having to cut back on medications     Some confusion on diabetes medications was have not.been refilled for over a year.  Pa   [2]   Past Surgical History:  Procedure Laterality Date    CHOLECYSTECTOMY      HYSTERECTOMY      endometrial cancer    HYSTERECTOMY      IR TIPS  05/04/2024    IR TIPS 05/04/2024 [facility]    OOPHORECTOMY      OTHER SURGICAL HISTORY  Hysterectomy was in 2007. Gallbladder was in 1999 and phrenectomy  was in 2024    Hysterectomy,  gallbladder removed and phrenectomy    PR ANAL PRESSURE RECORD Left 03/31/2013    Procedure: ANORECTAL MANOMETRY;  Surgeon: None None;  [facility];  Service: Gastroenterology    PR BREATH HYDROGEN TEST N/A 03/31/2013    Procedure: BREATH HYDROGEN TEST;  Surgeon: None None;  [facility];  Service: Gastroenterology    PR COLSC FLX W/RMVL OF TUMOR POLYP LESION SNARE TQ N/A 09/06/2020    Procedure: COLONOSCOPY FLEX; W/REMOV TUMOR/LES BY SNARE;  Surgeon: Benedetto Brady, MD;  [facility];  Service: Gastroenterology    PR UPPER GI ENDOSCOPY,BIOPSY N/A 04/15/2013    Procedure: UGI ENDOSCOPY; WITH BIOPSY, SINGLE OR MULTIPLE;  Surgeon: Allene Ards, MD;  [facility];  Service: Gastroenterology    PR UPPER GI ENDOSCOPY,BIOPSY N/A 09/06/2020    Procedure: UGI ENDOSCOPY; WITH BIOPSY, SINGLE OR MULTIPLE;  Surgeon: Benedetto Brady, MD;  [facility];  Service: Gastroenterology    PR UPPER GI ENDOSCOPY,BIOPSY N/A 05/05/2022    Procedure: UGI ENDOSCOPY; WITH BIOPSY, SINGLE OR MULTIPLE;  Surgeon: Benedetto Brady, MD;  [facility];  Service: Gastroenterology    PR UPPER GI ENDOSCOPY,BIOPSY N/A 01/22/2023    Procedure: UGI ENDOSCOPY; WITH  BIOPSY, SINGLE OR MULTIPLE;  Surgeon: Celestia Colander, MD;  [facility];  Service: Gastroenterology   [3]   Family History  Problem Relation Age of Onset    Diabetes Mother     Cancer Mother         Complications during dialysis    Depression Mother     Hypertension Mother     Vision loss Mother     No Known Problems Father     No Known Problems Brother     Diabetes Maternal Grandmother     Hypertension Maternal Grandmother     Alcohol abuse Maternal Grandmother         Heavy drinker    Arthritis Maternal Grandmother     Cancer Maternal Grandfather     Diabetes Maternal Grandfather     Alcohol abuse Maternal Grandfather     Heart disease Maternal Grandfather     No Known Problems Paternal Grandmother     Skin cancer Paternal Grandfather     Stroke Paternal Grandfather     Vision loss Maternal Aunt         Heart Attack    No Known Problems Maternal Uncle     No Known Problems Paternal Aunt     No Known Problems Paternal Uncle     No Known Problems Other     Melanoma Neg Hx     Basal cell carcinoma Neg Hx     Squamous cell carcinoma Neg Hx     Substance Abuse Disorder Neg Hx     Drug abuse Neg Hx     Mental illness Neg Hx     Amblyopia Neg Hx     Blindness Neg Hx     Cataracts Neg Hx     Glaucoma Neg Hx     Macular degeneration Neg Hx     Retinal detachment Neg Hx     Strabismus Neg Hx     Thyroid disease Neg Hx     Breast cancer Neg Hx

## 2024-05-11 NOTE — Unmapped (Signed)
 MICU Daily Progress Note     Date of Service: 05/11/2024    Problem List:   Principal Problem:    Shock     Active Problems:    Type 2 diabetes mellitus, with long-term current use of insulin        Hyperlipidemia    Hypertension    Coronary artery disease involving native heart without angina pectoris    Diabetes mellitus       Esophageal varices in cirrhosis       Hepatic encephalopathy       Calcified cerebral meningioma       Portal hypertension       Other chronic pain    OSA (obstructive sleep apnea)    Stage 3a chronic kidney disease (CMS-HCC)    Hematemesis      Interval history: Ashley Briggs is a 58 y.o. female with PMH CAD s/p NSTEMI, CKD, NAS cirrhosis c/b ascites, hepatic encephalopathy and EV s/p recent banding admitted with UGI bleed 2/2 bleeding esophageal ulcers s/p unsusccessful TIPS c/b pulsatile VT s/p shock requiring transfer to [facility] ICU. Course complicated by severe hemorrhagic shock 2/2 gastric bleed now s/p TIPS with course complicated by acute hypoxic respiratory failure, AKI, stress cardiomyopathy and hepatic encephalopathy. Now with slight increase in pressor requirement. Weaning sedation to plan for extubation in the near future.     24hr events:   - Ongoing hyperglycemia, increased NPH to 42u BID and started regular insulin  10u BID  - Transitioned to p.o. amiodarone   - Started midodrine 5mg  TID, SBP goal > 110  - Repeat TTE with LVEF 40%  - Mental status improving  - Improving pressor requirement    Neurological   Sedation  Holding all sedation with plans to extubate soon.    History of seizure-like activity (stable)  - Home Keppra  750mg  BID    Chronic pain  History of chronic pain, treated with baclofen , naltrexone , oxycodone .   - Hold home baclofen , naltrextone, oxycodone  in the setting of hypotension and sedation    Major depressive disorder (stable)  - Hold home bupoprion, citalopram     Insomnia (stable)  - Hold home zolpidem     PMH calcified cerebral meningioma (stable)  - no intervention at this time    Analgesia: pain adequately controlled      Pulmonary   Intubation and mechanical ventilation  Previously intubated for procedures and altered mental status. Extubated 6/5, re-intubated later the same day for worsening hypoxia. Now continues on ASV. Sedation weaned in anticipation of extubation as soon as mental status improves.  - Daily SBT    Pulmonary edema (improving)  Increasing hypoxia on 6/5, ultimately requiring re-intubation with FiO2 100%. CXR demonstrated moderate pulmonary edema. Etiology of pulmonary edema most likely cardiogenic given concurrent suspected stress cardiomyopathy, though she is also at risk for ARDS and noncardiogenic pulmonary edema cannot be excluded. Diuresed with improving oxygen requirement.   - CTM    Left pleural effusion (stable)  CXR 6/4 remarkable for moderate Left pleural effusion with passive atelectasis. S/p thoracentesis on 6/7. Pleural fluid c/w transudative effusion by protein parameters.   - CTM    Cardiovascular   Undifferentiated shock, hemorrhagic versus septic  Arrived to [facility] MICU in undifferentiated shock requiring 2 pressors. Acutely worsening hypotension on 6/4 requiring fourth pressor, aggressive fluid resuscitation, and massive transfusion protocol. S/p emergent TIPS on 6/4. Isolated fever on 6/6, prerenal AKI, and central venous O2 nearly 90 concerning for some component of septic shock, though infectious workup  remains unremarkable, and central venous O2 may be confounded in the setting of cirrhosis.  - Norepi, wean for SBP>110  - Continue midodrine 10mg  TID    C/f stress cardiomyopathy - Demand Ischemia - Type II NSTEMI  With elevated troponin on admission (5.4k) likely 2/2 demand ischemia in the setting of shock. Echo on admission overall normal with EF > 55%. Developed several episodes of wide complex tachycardia on 6/5. Bedside POCUS with apical ballooning and slightly decreased EF to 45-50%. EKG was without acute ischemic changes. Troponin peaked at 23k (6/6). Formal TTE demonstrated reduced EF to 45% with basal and midventricular hypokinesis. Findings favored to represent stress cardiomyopathy rather than ACS in the absence of acute EKG changes. Repeat TTE 6/10 with EF 40%, marginally decreased from prior.   - Cardiology consulted, appreciate recs   - Low concern for true ACS, no need for heparinization or ischemic eval acutely    Wide complex tachycardia - Ventricular tachycardia  Developed VT arrest requiring shock at OSH with initial TIPS attempt. On 6/5 developed several episodes of wide complex tachycardia. EKG with tachycardia to 130s, thought by cardiology to represent a fib with abberancy rather than VT. She was stabilized with amiodarone  bolus, followed by amiodarone  gtt. Will transition to PO amio to finish remainder of amiodarone  load, no plan for amio maintenance.   - Cardiology consulted, appreciate recs   - Stop amiodarone  gtt   - Completing po amiodarone  load  - q8h BMP  - Replete K>4, Mg>2    Premature atrial contractions versus a fib (rate-controlled)  Intermittently on tele 6/11. Rates controlled in the 80s, repleting K.   - EKG if rates increase    CAD, prior NSTEMI  Prior NSTEMI per chart review. No LHC data available. Unclear if she has history of coronary stent.   - Home aspirin  held ISO bleeding    Renal   Prerenal AKI, c/f HRS (improving)  Cr elevated to 1.32 from baseline ~0.8. Urine lytes most consistent with prerenal etiology, 2/2 hypoperfusion iso shock versus HRS. Cr slowly improving, now with increasing urine output.   - Management of contributing etiologies as above  - q8h BMP     Infectious Disease/Autoimmune   Undifferentiated shock, c/f septic shock - Fever (resolved) - Leukocytosis (resolved)  Presented to [facility] MICU in 2-pressor shock with leukocytosis to 24.9. UA noninfectious, CXR without pneumonia. Bcx NGTD x 24 hours. Most likely infectious source intra-abdominal. SBP considered, unfortunately ascites from paracentesis on 6/4 were not sent for diagnostic studies. No skin findings on exam to suggest SSTI. Femoral line placed at OSH possibly an infectious source, though Bcx have been NGTD. S/p vanc/zosyn . S/p tobramycin  x 1. With fever to 38.4 on 6/6. Continues now on vanc/zosyn . Paracentesis with PMNs < 250. Thoracentesis transudative. UA noninfectious. Lrcx gram stain with 1+ yeast, 1+ GPC. Overall, she has now received 6 days of broad spectrum antibiotic coverage thorough workup unremarkable for underlying infectious etiology. Increasing pressor requirement overnight 6/9. Repeat infectious workup pending, though low clinical concern for infection at this time.   - Bcx 6/4 NGTD, Bcx 6/6 NGTD x 48 hrs, Bcx 6/10 pending  - Ucx 6/4 negative  - Paracentesis 6/7 with ANC < 250, thoracentesis transudative  - Lrcx growing oropharyngeal flora  - Deferring further antibiotics iso low concern for infection    Cultures:  Blood Culture, Routine (no units)   Date Value   05/06/2024 No Growth at 4 days   05/06/2024 No Growth at 4 days  Urine Culture, Comprehensive (no units)   Date Value   05/04/2024 NO GROWTH     Lower Respiratory Culture (no units)   Date Value   05/07/2024 OROPHARYNGEAL FLORA ISOLATED   05/06/2024 Specimen Not Processed     WBC (10*9/L)   Date Value   05/11/2024 9.0     WBC, UA (/HPF)   Date Value   05/10/2024 25 (H)          FEN/GI   Decompensated cirrhosis c/b esophageal varices, gastric varix, hepatic encephalopathy, ascites   Presenting with hematemesis 6 days after esophageal banding. Emergent TIPS at OSH not successful d/t inability to access the portal vein. EGD on 6/4 demonstrated nonbleeding esophageal ulcers at site of prior banding, stomach completely occluded by blood which could not be cleared endoscopically. With acute decline in Hb and hypotension on 6/4. Darius Edouard attempted with GI, but unsuccessful after numerous attempts. S/p salvage TIPS on 6/4. Ongoing melena and dark red NGT output with stable Hb felt to represent prior bleeding. Low clinical concern for active GI bleeding.   - S/p salvage TIPS  - Hepatology following  - Daily MELLD labs (CMP, PT/INR)  - Maintain fibrinogen > 100 with cryo  - Home lasix , spironolactone  held d/t hypotension  - Continue rifaximin , continue lactulose  to 20g BID   - FMS in place, goal output 500cc-1L    Acute on chronic liver failure - Ischemic hepatitis (improving)  LFTs continue to rise s/p variceal hemorrhage and TIPS. Unfortunately, precluded from transplant eval with newly reduced EF. LFTs slowly improving, suggesting ongoing recovery from TIPS and hemorrhagic event.  - Hepatology following    Malnutrition Assessment:   Tube feeds started 6/9  Tube feeds:   Novasource Renal at goal rate 20 mL/hr. This provides 840 kcals, 40 g protein, 77 g carbohydrate, 43 g fat, 0 g fiber, 299 mL free water , and meets 42% USRDI.  Goal tube feeds (when norepi <10)  Recommend Novasource Renal at goal rate 30 mL/hr. This provides 1260 kcals, 59 g protein, 115 g carbohydrate, 64 g fat, 0 g fiber, 448 mL free water , and meets 63% USRDI.  Prosource TF 2 pkts BID   Total: 1500 kcal, 119 g protein       Heme/Coag   Blood loss anemia, likely 2/2 refractory portal hypertensive bleeding (stable)  S/p 3 units pRBCs at OSH, Hb 9.3 on arrival to MICU. S/p massive transfusion protocol on 6/4. Low clinical concern for ongoing blood loss with stable Hb, last transfusion 6/4.   - q8h CBC with diff    Mild Thrombocytopenia (stable)  Plts 253k on admission, acute decline to 50k on 6/5. Currently without signs of active bleeding. Plts remain stable around ~100k.  - q8h CBC with diff    Endocrine   Insulin -dependent T2DM  A1c 5.6 on admission. Home regimen lantus  60, lispro with meals. BG control challenging in the setting of critical illness and stress dose steroids. S/p insulin  infusion, converted to NPH 6/8. Ongoing hyperglycemia in the setting of continuous tube feeds, titrating insulin .   - NPH 42 units BID, regular insulin  10 units BID  - Regular SSI q4h    Integumentary   Psoriasis (stable)  History of psoriasis per chart review. With numerous chronic appearing scattered erythematous plaques with silvery scale. Of note, on Secukinumab  for psoriasis management, which can increase risk for infection.    # Sacral wound, 01/2024  - WOCN consulted for high risk skin assessment Yes.  - cont pressure mitigating  precautions per skin policy    Prophylaxis/LDA/Restraints/Consults   ICU checklist completed: yes (see ICU rounding navigator in Epic)    Patient Lines/Drains/Airways Status       Active Active Lines, Drains, & Airways       Name Placement date Placement time Site Days    ETT  7.5 05/05/24  1810  -- 5    CVC Triple Lumen 05/05/24 Non-tunneled Left Internal jugular 05/05/24  1740  Internal jugular  5    NG/OG Tube Right nostril 05/06/24  1930  Right nostril  4    Urethral Catheter 05/04/24  0500  --  7    Peripheral IV 05/04/24 Anterior;Left;Upper Arm 05/04/24  0500  Arm  7    Peripheral IV 05/04/24 Anterior;Right;Upper Arm 05/04/24  0500  Arm  7    Peripheral IV 05/06/24 Right Arm 05/06/24  1716  Arm  4    Arterial Line 05/10/24 Right Radial 05/10/24  1845  Radial  less than 1                  Patient Lines/Drains/Airways Status       Active Wounds       Name Placement date Placement time Site Days    Surgical Site 05/04/24 Abdomen Lower;Right 05/04/24  2300  -- 6    Surgical Site 05/04/24 Neck Right 05/04/24  2300  -- 6    Surgical Site 05/04/24 Groin Right 05/04/24  2300  -- 6                    Goals of Care     Code Status: Full Code    Designated Healthcare Decision Maker:  Ms. Rembold current decisional capacity for healthcare decision-making is Full capacity. Her designated healthcare decision maker(s) is/are   HCDM (patient stated preference): Caudle,Ryan - Relative - [phone].      Subjective     Patient lying in bed, intubated and sedated. More responsive today, opening eyes to voice, following commands.     Objective     Vitals - past 24 hours  Temp:  [37.3 ??C (99.2 ??F)-37.7 ??C (99.9 ??F)] 37.7 ??C (99.9 ??F)  Pulse:  [80-102] 81  SpO2 Pulse:  [80-104] 83  Resp:  [12-18] 16  BP: (124)/(45) 124/45  FiO2 (%):  [30 %] 30 %  SpO2:  [94 %-98 %] 96 % Intake/Output  I/O last 3 completed shifts:  In: 1841.1 [P.O.:120; I.V.:871.1; NG/GT:850]  Out: 2820 [Urine:2020; Stool:800]     Physical Exam:    General: Ill-appearing female, lying in bed. Intubated.   HEENT: Mucous membranes dry. Sclerae icteric.   CV: Regular rate. Irregular rhythm.   Pulm: CTAB on anterior exam. Intubated  GI: Normoactive bowel sounds. Soft, nondistended.  Extremities: Trace edema BLE.   Skin: Diffuse scattered erythematous plaques with silvery scale. No bruising, petechiae, mucocutaneous bleeding.  Neuro: Responds to voice, maintains eye contact, follows commands.     Continuous Infusions:    NORepinephrine  bitartrate-NS 6 mcg/min (05/11/24 0600)       Scheduled Medications:    amiodarone   400 mg Enteral tube: gastric TID    heparin (porcine) for subcutaneous use  5,000 Units Subcutaneous Q12H SCH    insulin  NPH  36 Units Subcutaneous Q12H    insulin  regular  0-20 Units Subcutaneous Q4H SCH    insulin  regular  10 Units Subcutaneous Q12H    lactulose   20 g Enteral tube: gastric BID    levETIRAcetam   750  mg Intravenous Q12H SCH    midodrine  5 mg Oral TID    multivitamins (ADULT)  1 tablet Enteral tube: gastric Daily    pantoprazole  (Protonix ) intravenous solution  40 mg Intravenous BID    potassium chloride  in water   20 mEq Intravenous Once    Followed by    potassium chloride  in water   20 mEq Intravenous Once    sodium chloride   10 mL Intravenous Q8H    sodium chloride   10 mL Intravenous Q8H    sodium chloride   10 mL Intravenous Q8H       PRN medications:  dextrose  in water , glucagon, glucose    Data/Imaging Review: Reviewed in Epic and personally interpreted on 05/11/2024. See EMR for detailed results.    Anissa Powell, MS4

## 2024-05-11 NOTE — Unmapped (Signed)
 Problem: Adult Inpatient Plan of Care  Goal: Plan of Care Review  Outcome: Ongoing - Unchanged  Goal: Patient-Specific Goal (Individualized)  Outcome: Ongoing - Unchanged  Goal: Absence of Hospital-Acquired Illness or Injury  Outcome: Ongoing - Unchanged  Intervention: Identify and Manage Fall Risk  Recent Flowsheet Documentation  Taken 05/11/2024 0800 by Author Board, RN  Safety Interventions:   aspiration precautions   bed alarm   environmental modification   fall reduction program maintained   lighting adjusted for tasks/safety   low bed   muscle strengthening facilitated   nonskid shoes/slippers when out of bed  Intervention: Prevent Skin Injury  Recent Flowsheet Documentation  Taken 05/11/2024 1200 by Author Board, RN  Positioning for Skin: Right  Taken 05/11/2024 1000 by Author Board, RN  Positioning for Skin: Left  Taken 05/11/2024 0800 by Author Board, RN  Positioning for Skin: Right  Skin Protection:   adhesive use limited   incontinence pads utilized   zinc oxide barrier cream   tubing/devices free from skin contact  Intervention: Prevent Infection  Recent Flowsheet Documentation  Taken 05/11/2024 0800 by Author Board, RN  Infection Prevention:   environmental surveillance performed   hand hygiene promoted   rest/sleep promoted   single patient room provided  Goal: Optimal Comfort and Wellbeing  Outcome: Ongoing - Unchanged  Goal: Readiness for Transition of Care  Outcome: Ongoing - Unchanged  Goal: Rounds/Family Conference  Outcome: Ongoing - Unchanged     Problem: Skin Injury Risk Increased  Goal: Skin Health and Integrity  Outcome: Ongoing - Unchanged  Intervention: Optimize Skin Protection  Recent Flowsheet Documentation  Taken 05/11/2024 1200 by Author Board, RN  Activity Management: bedrest  Pressure Reduction Techniques:   frequent weight shift encouraged   heels elevated off bed   weight shift assistance provided  Head of Bed [facility]) Positioning: HOB at 30 degrees  Taken 05/11/2024 1000 by Author Board, RN  Activity Management: bedrest  Pressure Reduction Techniques:   frequent weight shift encouraged   heels elevated off bed   weight shift assistance provided  Head of Bed [facility]) Positioning: HOB at 30 degrees  Taken 05/11/2024 0800 by Author Board, RN  Activity Management: bedrest  Pressure Reduction Techniques:   frequent weight shift encouraged   heels elevated off bed  Head of Bed (HOB) Positioning: HOB at 30 degrees  Pressure Reduction Devices:   pressure-redistributing mattress utilized   specialty bed utilized  Skin Protection:   adhesive use limited   incontinence pads utilized   zinc oxide barrier cream   tubing/devices free from skin contact  Taken 05/11/2024 0750 by Author Board, RN  Head of Bed [facility]) Positioning: HOB at 30-45 degrees     Problem: Fall Injury Risk  Goal: Absence of Fall and Fall-Related Injury  Outcome: Ongoing - Unchanged  Intervention: Promote Scientist, clinical (histocompatibility and immunogenetics) Documentation  Taken 05/11/2024 0800 by Author Board, RN  Safety Interventions:   aspiration precautions   bed alarm   environmental modification   fall reduction program maintained   lighting adjusted for tasks/safety   low bed   muscle strengthening facilitated   nonskid shoes/slippers when out of bed     Problem: Non-Violent Restraints  Goal: Patient will remain free of restraint events  Outcome: Ongoing - Unchanged  Goal: Patient will remain free of physical injury  Outcome: Ongoing - Unchanged     Problem: Wound  Goal: Optimal Coping  Outcome: Ongoing - Unchanged  Goal: Optimal Functional Ability  Outcome:  Ongoing - Unchanged  Intervention: Optimize Functional Ability  Recent Flowsheet Documentation  Taken 05/11/2024 1200 by Author Board, RN  Activity Management: bedrest  Taken 05/11/2024 1000 by Author Board, RN  Activity Management: bedrest  Taken 05/11/2024 0800 by Author Board, RN  Activity Management: bedrest  Goal: Absence of Infection Signs and Symptoms  Outcome: Ongoing - Unchanged  Intervention: Prevent or Manage Infection  Recent Flowsheet Documentation  Taken 05/11/2024 0800 by Author Board, RN  Infection Management: aseptic technique maintained  Goal: Improved Oral Intake  Outcome: Ongoing - Unchanged  Goal: Optimal Pain Control and Function  Outcome: Ongoing - Unchanged  Intervention: Prevent or Manage Pain  Recent Flowsheet Documentation  Taken 05/11/2024 0800 by Author Board, RN  Sleep/Rest Enhancement:   regular sleep/rest pattern promoted   relaxation techniques promoted   room darkened   noise level reduced   awakenings minimized  Goal: Skin Health and Integrity  Outcome: Ongoing - Unchanged  Intervention: Optimize Skin Protection  Recent Flowsheet Documentation  Taken 05/11/2024 1200 by Author Board, RN  Activity Management: bedrest  Pressure Reduction Techniques:   frequent weight shift encouraged   heels elevated off bed   weight shift assistance provided  Head of Bed (HOB) Positioning: HOB at 30 degrees  Taken 05/11/2024 1000 by Author Board, RN  Activity Management: bedrest  Pressure Reduction Techniques:   frequent weight shift encouraged   heels elevated off bed   weight shift assistance provided  Head of Bed [facility]) Positioning: HOB at 30 degrees  Taken 05/11/2024 0800 by Author Board, RN  Activity Management: bedrest  Pressure Reduction Techniques:   frequent weight shift encouraged   heels elevated off bed  Head of Bed (HOB) Positioning: HOB at 30 degrees  Pressure Reduction Devices:   pressure-redistributing mattress utilized   specialty bed utilized  Skin Protection:   adhesive use limited   incontinence pads utilized   zinc oxide barrier cream   tubing/devices free from skin contact  Taken 05/11/2024 0750 by Author Board, RN  Head of Bed (HOB) Positioning: HOB at 30-45 degrees  Goal: Optimal Wound Healing  Outcome: Ongoing - Unchanged  Intervention: Promote Wound Healing  Recent Flowsheet Documentation  Taken 05/11/2024 0800 by Author Board, RN  Sleep/Rest Enhancement:   regular sleep/rest pattern promoted   relaxation techniques promoted   room darkened   noise level reduced   awakenings minimized     Problem: Self-Care Deficit  Goal: Improved Ability to Complete Activities of Daily Living  Outcome: Ongoing - Unchanged     Problem: Mechanical Ventilation Invasive  Goal: Effective Communication  Outcome: Ongoing - Unchanged  Goal: Optimal Device Function  Outcome: Ongoing - Unchanged  Intervention: Optimize Device Care and Function  Recent Flowsheet Documentation  Taken 05/11/2024 1200 by Author Board, RN  Oral Care:   mouth swabbed   oral rinse provided   suction provided  Goal: Mechanical Ventilation Liberation  Outcome: Ongoing - Unchanged  Intervention: Promote Extubation and Mechanical Ventilation Liberation  Recent Flowsheet Documentation  Taken 05/11/2024 0800 by Author Board, RN  Sleep/Rest Enhancement:   regular sleep/rest pattern promoted   relaxation techniques promoted   room darkened   noise level reduced   awakenings minimized  Goal: Optimal Nutrition Delivery  Outcome: Ongoing - Unchanged  Goal: Absence of Device-Related Skin and Tissue Injury  Outcome: Ongoing - Unchanged  Goal: Absence of Ventilator-Induced Lung Injury  Outcome: Ongoing - Unchanged  Intervention: Prevent Ventilator-Associated Pneumonia  Recent Flowsheet Documentation  Taken 05/11/2024 1200  by Author Board, RN  Head of Bed [facility]) Positioning: HOB at 30 degrees  Oral Care:   mouth swabbed   oral rinse provided   suction provided  Taken 05/11/2024 1000 by Author Board, RN  Head of Bed [facility]) Positioning: HOB at 30 degrees  Taken 05/11/2024 0800 by Author Board, RN  Head of Bed [facility]) Positioning: HOB at 30 degrees  Taken 05/11/2024 0750 by Author Board, RN  Head of Bed [facility]) Positioning: HOB at 30-45 degrees

## 2024-05-11 NOTE — Unmapped (Addendum)
 OCCUPATIONAL THERAPY  Evaluation (05/11/24 1001)    Patient Name:  Ashley Briggs       Medical Record Number: [id number]     Date of Birth: 02/05/1966  Sex: Female      Post-Discharge Occupational Therapy Recommendations: 5x weekly, Low intensity          Equipment Recommendation  OT DME Recommendations: Defer to post acute       OT Treatment Diagnosis: Cognitive changes due to medical disorder, Generalized muscle weakness, Need for assistance with personal care, Reduced mobility, Unsteadiness on feet         Assessment  Problem List: Dizziness/vertigo, Decreased strength, Decreased mobility, Impaired ADLs, Decreased endurance, Impaired balance, Fall risk, Decreased range of motion, Impaired judgement, Decreased cognition, Decreased coordination, Impaired fine motor skills  Personal Factors/Comorbidities (Occupational Profile and History Review): Extensive (High)  Assessment of Occupational Performance : Balance, Sensation, Strength, Cognitive skills, Dexterity, Endurance, Mobility, Psychosocial skills, Fine or gross motor coordination  Clinical Decision Making: High Complexity    Assessment: Pt presents willing to participate, answer yes/no questions, and mobilize all extremities in bed.  Pt progressing to EOB and brief OOB with 2 person assist. Pt exhibiting anterior lean at EOB and flexed posture standing. Pt exhibits difficulty reaching face with BUE and unable to reach BLE for ADLs. Pt A&Ox1, will need to confirm PLOF and home setup. Pt exhibits deficits in functional independence and will benefit from acute OT services to progress towards PLOF.          Today's Interventions: Pt educated on role of OT, POC, bed mobility, sitting balance/tolerance, grooming, delirium prev/cognition, functional transfers, educ on benefits of OOB, educ on pacing strategies, positioning.       Activity Tolerance During Today's Session  Limited by Mental Status, Tolerated treatment well    Plan  Planned Frequency of Treatment: Plan of Care Initiated: 05/11/24  1-2x per day Weekly Frequency: 3-4 days per week  Planned Treatment Duration: 06/08/24    Planned Interventions:  Cognitive Skills Development, Education (Patient/Family/Caregiver), Therapeutic Exercise, Self-Care/Home Training, Therapeutic Activity      GOALS:   Patient and Family Goals: TBD    Short Term:   SHORT GOAL #1: Pt will complete BSC transfer with MOD A.   Time Frame : 2 weeks  SHORT GOAL #2: Pt will complete grooming with MIN A.   Time Frame : 2 weeks  SHORT GOAL #3: Pt will score 30/30 on O Log.   Time Frame : 2 weeks           Long Term Goal #1: Pt will maximize independence with ADLs and functional transfers within 4 weeks.  Time Frame: 2 months    Prognosis:  Good  Positive Indicators:  Participatory  Barriers to Discharge: Severity of deficits, Functional strength deficits, Impaired Balance, Gait instability, Cognitive deficits, Endurance deficits, Inability to safely perform ADLS    Subjective  Prior Functional Status Pt nods yes to being independent with ADL and mobility, family assisting with IADL and driving. Pt denies falls. Will need to clarify.    Living Situation  Living Environment: Trailer/Mobile home (Per EMR, will need to confirm)  Lives With: Other, Family (Cousin who has MD)  Home Living: Stairs to enter with rails, Walk-in shower, Standard height toilet, One level home, Hand-held shower hose (Will need to ocnfirm)  Rail placement (outside): Bilateral rails in reach  Number of Stairs to Enter (outside): 5  Caregiver Identified?:  (TBD)     Equipment  available at home:  (TBD)    Medical Tests / Procedures: Reviewed in EPIC       Patient / Caregiver reports: Pt nodding yes to being at [facility]      Past Medical History[1] Social History     Tobacco Use    Smoking status: Never     Passive exposure: Current    Smokeless tobacco: Never   Substance Use Topics    Alcohol use: Not Currently     Comment: No longer      Past Surgical History[2] Family History[3]     Patient has no known allergies.     Objective Findings  Precautions / Restrictions  Falls precautions       Weight Bearing  Non-applicable    Required Braces or Orthoses  Non-applicable    Communication Preference  Verbal       Pain  Denies pain    Equipment / Environment  Vascular access (PIV, TLC, Port-a-cath, PICC), Ventilatory support, Supplemental oxygen, Arterial line, Telemetry, Foley, Rectal bag, NGT         Cognition   Orientation Level:  Disoriented to place, Disoriented to time, Disoriented to situation (Nods yes to Dr's office, [place], July)   Arousal/Alertness:  Delayed responses to stimuli   Attention Span:  Attends with cues to redirect   Memory:  Unable to assess   Following Commands:  Follows one step commands with increased time, Follows one step commands with repetition   Safety Judgment:  Decreased awareness of need for safety   Awareness of Errors and Problem Solving:  Assistance required to identify errors made, Assistance required to generate solutions, Assistance required to implement solutions   Comments: drowsy but following all commands    Vision / Hearing      Vision Comments: UTA  Hearing: No deficit identified         Hand Function:  Right Hand Function: Right hand function impaired  Right Hand Impairment: grip strength fair, coordination impaired  Left Hand Function: Left hand function impaired  Left Hand Impairment: grip strength fair, coordination impaired    Skin Inspection:  Skin Inspection: Swelling  Skin Inspection comment: Redness/rash on BLE and back.         ROM / Strength:  UE ROM/Strength: Left Impaired/Limited, Right Impaired/Limited  RUE Impairment: Reduced strength, Limited AROM  LUE Impairment: Reduced strength, Limited AROM  LE ROM/Strength: Left Impaired/Limited, Right Impaired/Limited  RLE Impairment: Reduced strength, Limited AROM  LLE Impairment: Reduced strength, Limited AROM    Coordination:  Coordination: Impaired    Sensation:  Sensory/ Proprioception/ Stereognosis comments: UTA    Balance:  Sitting Balance comments: MIN A-MOD A for anterior lean, unable to correct    Standing Balance comments: MAXx2 with B HHA and B knee block    Functional Mobility  Transfers: +2 assist (sit-stand=MAXx2, flexed posture, unable to achieve full upright standing)  Bed Mobility - Needs Assistance: +2 assist (Supine-sit=MAXx2, sit-supine=totalx2, boosing=totalx2, rolling=MAXx2)  Ambulation: NT    ADLs  ADLs: Needs assistance with ADLs  ADLs - Needs Assistance: UB dressing, LB dressing, Toileting, Grooming, Bathing, Feeding  Feeding - Needs Assistance: Total Assist  Grooming - Needs Assistance: Total Assist  Bathing - Needs Assistance: Total Assist  Toileting - Needs Assistance: Total Assist  UB Dressing - Needs Assistance: Total Assist  LB Dressing - Needs Assistance: Total Assist    Vitals / Orthostatics  Vitals/Orthostatics: VSS on 30% PSV    Patient at end of session: All needs in  reach, In bed, Lines intact, Notified Nurse     Occupational Therapy Session Duration  OT Individual [mins]: 20 (10 eval, 10 ind)  OT Co-Treatment [mins]: 16 (overlap with [facility])  Reason for Co-treatment: Requires heavy assist for safety, To safely progress mobility, Poor activity tolerance         AM-PAC-Daily Activity  Lower Body Dressing assistance needs: Unable to do/total assistance - Total Dependent Assist  Bathing assistance needs: Unable to do/total assistance - Total Dependent Assist  Toileting assistance needs: Unable to do/total assistance - Total Dependent Assist  Upper Body Dressing assistance needs: Unable to do/total assistance - Total Dependent Assist  Personal Grooming assistance needs: Unable to do/total assistance - Total Dependent Assist  Eating Meals assistance needs: Unable to do/total assistance - Total Dependent Assist    Daily Activity Score: 6    Score (in points): % of Functional Impairment, Limitation, Restriction  6: 100% impaired, limited, restricted  7-8: At least 80%, but less than 100% impaired, limited restricted  9-13: At least 60%, but less than 80% impaired, limited restricted  14-19: At least 40%, but less than 60% impaired, limited restricted  20-22: At least 20%, but less than 40% impaired, limited restricted  23: At least 1%, but less than 20% impaired, limited restricted  24: 0% impaired, limited restricted      I attest that I have reviewed the above information.  Signed: Hartley Line, OT  Filed 05/11/2024                 [1]   Past Medical History:  Diagnosis Date    Anemia     Angular blepharoconjunctivitis of both eyes 12/21/2020    Anxiety     Arthritis     Ascites     Blepharitis     Calcified cerebral meningioma    08/16/2022    Cataract associated with type 2 diabetes mellitus    12/21/2020    Chronic headaches     Chronic pain     Chronic pain disorder 2029    I have multiple issues. Seeing doctor's regarding  issues    Cirrhosis        Coronary artery disease involving native heart without angina pectoris 02/05/2021    Current moderate episode of major depressive disorder (CMS-HCC) 11/06/2022    Diabetes mellitus        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 type 2 diabetes mellitus    12/02/2022    Dry eye syndrome, bilateral 12/21/2020    Dysphagia 02/02/2023    Endometrial cancer    2007    Esophageal varices in cirrhosis    06/17/2022    Fractures 01/29/2018    Gastroparesis     Generalized pruritus 02/02/2023    GERD (gastroesophageal reflux disease) It's  been going on for years    I take medication  which helps tremendously    Glaucoma 2023    I see a doctor on a regular basis    H/O diabetic gastroparesis 01/09/2021    Heart disease     Hemorrhage of rectum and anus 03/17/2011    Hepatic cirrhosis    06/17/2022    Hepatic encephalopathy    06/17/2022    Hepatic steatosis 03/13/2021    Noted on CT Fall 2021    High cholesterol     History of encephalopathy     Hx of psoriasis 08/06/2022    Hyperlipidemia 12/12/2015  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    Liver disease     Major depression     Metabolic dysfunction-associated steatohepatitis (MASH)     Morbid obesity (CMS-HCC) 11/10/2019    Myopia of both eyes with astigmatism and presbyopia 12/02/2022    Nausea 09/22/2022    Non-alcoholic fatty liver disease     NPDR (nonproliferative diabetic retinopathy)        Right eye    Other ascites 09/22/2022    Other chronic pain 11/06/2022    Other insomnia 11/06/2022    PFD (pelvic floor dysfunction) 04/07/2013    Portal hypertension    10/08/2022    Psoriasis     Ptosis of eyelid, left     Left upper eyelid    Reflux     Retinopathy of left eye, background, proliferative 01/09/2021    Right upper quadrant abdominal pain 09/22/2022    Secondary esophageal varices without bleeding    06/17/2022    Type 2 diabetes mellitus, with long-term current use of insulin     12/12/2015    Last Assessment & Plan:   The current medical regimen is effective;  continue present plan and medications.  Patient will continue good management of diabetes especially with weight loss. Hopefully with further weight loss will run into the problem of being overmedicated and having to cut back on medications     Some confusion on diabetes medications was have not.been refilled for over a year.  Pa   [2]   Past Surgical History:  Procedure Laterality Date    CHOLECYSTECTOMY      HYSTERECTOMY      endometrial cancer    HYSTERECTOMY      IR TIPS  05/04/2024    IR TIPS 05/04/2024 Vernestine Gondola [facility]IR [facility]    OOPHORECTOMY      OTHER SURGICAL HISTORY  Hysterectomy was in 2007. Gallbladder was in 1999 and phrenectomy  was in 2024    Hysterectomy,  gallbladder removed and phrenectomy    PR ANAL PRESSURE RECORD Left 03/31/2013    Procedure: ANORECTAL MANOMETRY;  Surgeon: None None;  [facility];  Service: Gastroenterology    PR BREATH HYDROGEN TEST N/A 03/31/2013 Procedure: BREATH HYDROGEN TEST;  Surgeon: None None;  Location: [facility];  Service: Gastroenterology    PR COLSC FLX W/RMVL OF TUMOR POLYP LESION SNARE TQ N/A 09/06/2020    Procedure: COLONOSCOPY FLEX; W/REMOV TUMOR/LES BY SNARE;  Surgeon: Benedetto Brady, MD;  Location: [facility];  Service: Gastroenterology    PR UPPER GI ENDOSCOPY,BIOPSY N/A 04/15/2013    Procedure: UGI ENDOSCOPY; WITH BIOPSY, SINGLE OR MULTIPLE;  Surgeon: Allene Ards, MD;  Location: [facility];  Service: Gastroenterology    PR UPPER GI ENDOSCOPY,BIOPSY N/A 09/06/2020    Procedure: UGI ENDOSCOPY; WITH BIOPSY, SINGLE OR MULTIPLE;  Surgeon: Benedetto Brady, MD;  Location: [facility];  Service: Gastroenterology    PR UPPER GI ENDOSCOPY,BIOPSY N/A 05/05/2022    Procedure: UGI ENDOSCOPY; WITH BIOPSY, SINGLE OR MULTIPLE;  Surgeon: Benedetto Brady, MD;  Location: [facility];  Service: Gastroenterology    PR UPPER GI ENDOSCOPY,BIOPSY N/A 01/22/2023    Procedure: UGI ENDOSCOPY; WITH BIOPSY, SINGLE OR MULTIPLE;  Surgeon: Celestia Colander, MD;  [facility]: [facility];  Service: Gastroenterology   [3]   Family History  Problem Relation Age of Onset    Diabetes  Mother     Cancer Mother         Complications during dialysis    Depression Mother     Hypertension Mother     Vision loss Mother     No Known Problems Father     No Known Problems Brother     Diabetes Maternal Grandmother     Hypertension Maternal Grandmother     Alcohol abuse Maternal Grandmother         Heavy drinker    Arthritis Maternal Grandmother     Cancer Maternal Grandfather     Diabetes Maternal Grandfather     Alcohol abuse Maternal Grandfather     Heart disease Maternal Grandfather     No Known Problems Paternal Grandmother     Skin cancer Paternal Grandfather     Stroke Paternal Grandfather     Vision loss Maternal Aunt         Heart Attack    No Known Problems Maternal Uncle     No Known Problems Paternal Aunt     No Known Problems Paternal Uncle     No Known Problems Other     Melanoma Neg Hx     Basal cell carcinoma Neg Hx     Squamous cell carcinoma Neg Hx     Substance Abuse Disorder Neg Hx     Drug abuse Neg Hx     Mental illness Neg Hx     Amblyopia Neg Hx     Blindness Neg Hx     Cataracts Neg Hx     Glaucoma Neg Hx     Macular degeneration Neg Hx     Retinal detachment Neg Hx     Strabismus Neg Hx     Thyroid disease Neg Hx     Breast cancer Neg Hx

## 2024-05-12 LAB — CBC W/ AUTO DIFF
BASOPHILS ABSOLUTE COUNT: 0 10*9/L (ref 0.0–0.1)
BASOPHILS ABSOLUTE COUNT: 0 10*9/L (ref 0.0–0.1)
BASOPHILS ABSOLUTE COUNT: 0.1 10*9/L (ref 0.0–0.1)
BASOPHILS RELATIVE PERCENT: 0.1 %
BASOPHILS RELATIVE PERCENT: 0.2 %
BASOPHILS RELATIVE PERCENT: 0.4 %
EOSINOPHILS ABSOLUTE COUNT: 0.2 10*9/L (ref 0.0–0.5)
EOSINOPHILS ABSOLUTE COUNT: 0.2 10*9/L (ref 0.0–0.5)
EOSINOPHILS ABSOLUTE COUNT: 0.3 10*9/L (ref 0.0–0.5)
EOSINOPHILS RELATIVE PERCENT: 2.2 %
EOSINOPHILS RELATIVE PERCENT: 2.1 %
EOSINOPHILS RELATIVE PERCENT: 2.3 %
HEMATOCRIT: 27.1 % — ABNORMAL LOW (ref 34.0–44.0)
HEMATOCRIT: 27.8 % — ABNORMAL LOW (ref 34.0–44.0)
HEMATOCRIT: 28.3 % — ABNORMAL LOW (ref 34.0–44.0)
HEMOGLOBIN: 9.3 g/dL — ABNORMAL LOW (ref 11.3–14.9)
HEMOGLOBIN: 9.3 g/dL — ABNORMAL LOW (ref 11.3–14.9)
HEMOGLOBIN: 9.2 g/dL — ABNORMAL LOW (ref 11.3–14.9)
LYMPHOCYTES ABSOLUTE COUNT: 0.8 10*9/L — ABNORMAL LOW (ref 1.1–3.6)
LYMPHOCYTES ABSOLUTE COUNT: 0.7 10*9/L — ABNORMAL LOW (ref 1.1–3.6)
LYMPHOCYTES ABSOLUTE COUNT: 0.9 10*9/L — ABNORMAL LOW (ref 1.1–3.6)
LYMPHOCYTES RELATIVE PERCENT: 8 %
LYMPHOCYTES RELATIVE PERCENT: 6.7 %
LYMPHOCYTES RELATIVE PERCENT: 7.2 %
MEAN CORPUSCULAR HEMOGLOBIN CONC: 34.5 g/dL (ref 32.0–36.0)
MEAN CORPUSCULAR HEMOGLOBIN CONC: 33.4 g/dL (ref 32.0–36.0)
MEAN CORPUSCULAR HEMOGLOBIN CONC: 32.3 g/dL (ref 32.0–36.0)
MEAN CORPUSCULAR HEMOGLOBIN: 30.2 pg (ref 25.9–32.4)
MEAN CORPUSCULAR HEMOGLOBIN: 29.6 pg (ref 25.9–32.4)
MEAN CORPUSCULAR HEMOGLOBIN: 29 pg (ref 25.9–32.4)
MEAN CORPUSCULAR VOLUME: 87.7 fL (ref 77.6–95.7)
MEAN CORPUSCULAR VOLUME: 88.5 fL (ref 77.6–95.7)
MEAN CORPUSCULAR VOLUME: 89.7 fL (ref 77.6–95.7)
MEAN PLATELET VOLUME: 9.5 fL (ref 6.8–10.7)
MEAN PLATELET VOLUME: 9.5 fL (ref 6.8–10.7)
MEAN PLATELET VOLUME: 9.5 fL (ref 6.8–10.7)
MONOCYTES ABSOLUTE COUNT: 1.3 10*9/L — ABNORMAL HIGH (ref 0.3–0.8)
MONOCYTES ABSOLUTE COUNT: 1.3 10*9/L — ABNORMAL HIGH (ref 0.3–0.8)
MONOCYTES ABSOLUTE COUNT: 1.4 10*9/L — ABNORMAL HIGH (ref 0.3–0.8)
MONOCYTES RELATIVE PERCENT: 12.6 %
MONOCYTES RELATIVE PERCENT: 13.3 %
MONOCYTES RELATIVE PERCENT: 11.8 %
NEUTROPHILS ABSOLUTE COUNT: 8 10*9/L — ABNORMAL HIGH (ref 1.8–7.8)
NEUTROPHILS ABSOLUTE COUNT: 7.9 10*9/L — ABNORMAL HIGH (ref 1.8–7.8)
NEUTROPHILS ABSOLUTE COUNT: 9.5 10*9/L — ABNORMAL HIGH (ref 1.8–7.8)
NEUTROPHILS RELATIVE PERCENT: 77.1 %
NEUTROPHILS RELATIVE PERCENT: 77.7 %
NEUTROPHILS RELATIVE PERCENT: 78.3 %
PLATELET COUNT: 92 10*9/L — ABNORMAL LOW (ref 150–450)
PLATELET COUNT: 91 10*9/L — ABNORMAL LOW (ref 150–450)
PLATELET COUNT: 109 10*9/L — ABNORMAL LOW (ref 150–450)
RED BLOOD CELL COUNT: 3.09 10*12/L — ABNORMAL LOW (ref 3.95–5.13)
RED BLOOD CELL COUNT: 3.14 10*12/L — ABNORMAL LOW (ref 3.95–5.13)
RED BLOOD CELL COUNT: 3.16 10*12/L — ABNORMAL LOW (ref 3.95–5.13)
RED CELL DISTRIBUTION WIDTH: 19 % — ABNORMAL HIGH (ref 12.2–15.2)
RED CELL DISTRIBUTION WIDTH: 19 % — ABNORMAL HIGH (ref 12.2–15.2)
RED CELL DISTRIBUTION WIDTH: 19.6 % — ABNORMAL HIGH (ref 12.2–15.2)
WBC ADJUSTED: 10.4 10*9/L (ref 3.6–11.2)
WBC ADJUSTED: 10.1 10*9/L (ref 3.6–11.2)
WBC ADJUSTED: 12.1 10*9/L — ABNORMAL HIGH (ref 3.6–11.2)

## 2024-05-12 LAB — BLOOD GAS CRITICAL CARE PANEL, ARTERIAL
BASE EXCESS ARTERIAL: 2.6 — ABNORMAL HIGH (ref -2.0–2.0)
CALCIUM IONIZED ARTERIAL (MG/DL): 5.23 mg/dL (ref 4.40–5.40)
GLUCOSE WHOLE BLOOD: 304 mg/dL — ABNORMAL HIGH (ref 70–179)
HCO3 ARTERIAL: 26 mmol/L (ref 22–27)
HEMOGLOBIN BLOOD GAS: 9 g/dL — ABNORMAL LOW (ref 12.00–16.00)
LACTATE BLOOD ARTERIAL: 2.3 mmol/L — ABNORMAL HIGH (ref <1.3)
O2 SATURATION ARTERIAL: 99.1 % (ref 94.0–100.0)
PCO2 ARTERIAL: 36.5 mmHg (ref 35.0–45.0)
PH ARTERIAL: 7.47 — ABNORMAL HIGH (ref 7.35–7.45)
PO2 ARTERIAL: 108 mmHg (ref 80.0–110.0)
POTASSIUM WHOLE BLOOD: 3.8 mmol/L (ref 3.4–4.6)
SODIUM WHOLE BLOOD: 150 mmol/L — ABNORMAL HIGH (ref 135–145)

## 2024-05-12 LAB — COMPREHENSIVE METABOLIC PANEL
ALBUMIN: 2.6 g/dL — ABNORMAL LOW (ref 3.4–5.0)
ALBUMIN: 2.5 g/dL — ABNORMAL LOW (ref 3.4–5.0)
ALBUMIN: 2.6 g/dL — ABNORMAL LOW (ref 3.4–5.0)
ALKALINE PHOSPHATASE: 103 U/L (ref 46–116)
ALKALINE PHOSPHATASE: 107 U/L (ref 46–116)
ALKALINE PHOSPHATASE: 108 U/L (ref 46–116)
ALT (SGPT): 192 U/L — ABNORMAL HIGH (ref 10–49)
ALT (SGPT): 182 U/L — ABNORMAL HIGH (ref 10–49)
ALT (SGPT): 161 U/L — ABNORMAL HIGH (ref 10–49)
ANION GAP: 11 mmol/L (ref 5–14)
ANION GAP: 10 mmol/L (ref 5–14)
ANION GAP: 10 mmol/L (ref 5–14)
AST (SGOT): 110 U/L — ABNORMAL HIGH (ref <=34)
AST (SGOT): 101 U/L — ABNORMAL HIGH (ref <=34)
AST (SGOT): 96 U/L — ABNORMAL HIGH (ref <=34)
BILIRUBIN TOTAL: 14.8 mg/dL — ABNORMAL HIGH (ref 0.3–1.2)
BILIRUBIN TOTAL: 14.7 mg/dL — ABNORMAL HIGH (ref 0.3–1.2)
BILIRUBIN TOTAL: 14.2 mg/dL — ABNORMAL HIGH (ref 0.3–1.2)
BLOOD UREA NITROGEN: 68 mg/dL — ABNORMAL HIGH (ref 9–23)
BLOOD UREA NITROGEN: 68 mg/dL — ABNORMAL HIGH (ref 9–23)
BLOOD UREA NITROGEN: 62 mg/dL — ABNORMAL HIGH (ref 9–23)
BUN / CREAT RATIO: 73
BUN / CREAT RATIO: 74
BUN / CREAT RATIO: 76
CALCIUM: 9.6 mg/dL (ref 8.7–10.4)
CALCIUM: 9.7 mg/dL (ref 8.7–10.4)
CALCIUM: 9.4 mg/dL (ref 8.7–10.4)
CHLORIDE: 113 mmol/L — ABNORMAL HIGH (ref 98–107)
CHLORIDE: 114 mmol/L — ABNORMAL HIGH (ref 98–107)
CHLORIDE: 115 mmol/L — ABNORMAL HIGH (ref 98–107)
CO2: 25 mmol/L (ref 20.0–31.0)
CO2: 26 mmol/L (ref 20.0–31.0)
CO2: 25 mmol/L (ref 20.0–31.0)
CREATININE: 0.93 mg/dL (ref 0.55–1.02)
CREATININE: 0.92 mg/dL (ref 0.55–1.02)
CREATININE: 0.82 mg/dL (ref 0.55–1.02)
EGFR CKD-EPI (2021) FEMALE: 71 mL/min/1.73m2 (ref >=60)
EGFR CKD-EPI (2021) FEMALE: 72 mL/min/1.73m2 (ref >=60)
EGFR CKD-EPI (2021) FEMALE: 83 mL/min/1.73m2 (ref >=60)
GLUCOSE RANDOM: 317 mg/dL — ABNORMAL HIGH (ref 70–179)
GLUCOSE RANDOM: 312 mg/dL — ABNORMAL HIGH (ref 70–179)
GLUCOSE RANDOM: 272 mg/dL — ABNORMAL HIGH (ref 70–179)
POTASSIUM: 4 mmol/L (ref 3.4–4.8)
POTASSIUM: 3.9 mmol/L (ref 3.4–4.8)
POTASSIUM: 4 mmol/L (ref 3.4–4.8)
PROTEIN TOTAL: 5.7 g/dL (ref 5.7–8.2)
PROTEIN TOTAL: 5.7 g/dL (ref 5.7–8.2)
PROTEIN TOTAL: 5.8 g/dL (ref 5.7–8.2)
SODIUM: 149 mmol/L — ABNORMAL HIGH (ref 135–145)
SODIUM: 150 mmol/L — ABNORMAL HIGH (ref 135–145)
SODIUM: 150 mmol/L — ABNORMAL HIGH (ref 135–145)

## 2024-05-12 LAB — BLOOD GAS, ARTERIAL
BASE EXCESS ARTERIAL: 1.8 (ref -2.0–2.0)
HCO3 ARTERIAL: 24 mmol/L (ref 22–27)
O2 SATURATION ARTERIAL: 99.4 % (ref 94.0–100.0)
PCO2 ARTERIAL: 27.9 mmHg — ABNORMAL LOW (ref 35.0–45.0)
PH ARTERIAL: 7.55 — ABNORMAL HIGH (ref 7.35–7.45)
PO2 ARTERIAL: 95.3 mmHg (ref 80.0–110.0)

## 2024-05-12 LAB — SLIDE REVIEW

## 2024-05-12 LAB — FIBRINOGEN: FIBRINOGEN LEVEL: 129 mg/dL — ABNORMAL LOW (ref 175–500)

## 2024-05-12 LAB — CYSTATIN C
CYSTATIN C: 2.68 mg/L — ABNORMAL HIGH (ref 0.64–1.23)
EGFR CKD-EPI (2012) CYSTATIN C FEMALE: 20 mL/min/{1.73_m2} — ABNORMAL LOW (ref >=60)

## 2024-05-12 LAB — PROTIME-INR
INR: 1.46
INR: 1.42
PROTIME: 16.6 s — ABNORMAL HIGH (ref 9.9–12.6)
PROTIME: 16.2 s — ABNORMAL HIGH (ref 9.9–12.6)

## 2024-05-12 LAB — MAGNESIUM
MAGNESIUM: 2.7 mg/dL — ABNORMAL HIGH (ref 1.6–2.6)
MAGNESIUM: 2.7 mg/dL — ABNORMAL HIGH (ref 1.6–2.6)
MAGNESIUM: 2.6 mg/dL (ref 1.6–2.6)

## 2024-05-12 MED ADMIN — amiodarone (PACERONE) tablet 400 mg: 400 mg | GASTROENTERAL | @ 11:00:00 | Stop: 2024-05-12

## 2024-05-12 MED ADMIN — insulin regular (HumuLIN,NovoLIN) inj CORRECTIONAL 0-20 Units: 0-20 [IU] | SUBCUTANEOUS | @ 02:00:00

## 2024-05-12 MED ADMIN — sodium chloride (NS) 0.9 % flush 10 mL: 10 mL | INTRAVENOUS

## 2024-05-12 MED ADMIN — heparin (porcine) 5,000 unit/mL injection 5,000 Units: 5000 [IU] | SUBCUTANEOUS | @ 02:00:00

## 2024-05-12 MED ADMIN — sodium chloride (NS) 0.9 % flush 10 mL: 10 mL | INTRAVENOUS | @ 16:00:00

## 2024-05-12 MED ADMIN — midodrine (PROAMATINE) tablet 5 mg: 5 mg | ORAL | @ 16:00:00

## 2024-05-12 MED ADMIN — sodium chloride (NS) 0.9 % flush 10 mL: 10 mL | INTRAVENOUS | @ 08:00:00

## 2024-05-12 MED ADMIN — insulin regular (HumuLIN,NovoLIN) inj CORRECTIONAL 0-20 Units: 0-20 [IU] | SUBCUTANEOUS | @ 06:00:00 | Stop: 2024-05-12

## 2024-05-12 MED ADMIN — insulin regular (HumuLIN,NovoLIN) injection 10 Units: 10 [IU] | SUBCUTANEOUS | @ 16:00:00

## 2024-05-12 MED ADMIN — insulin regular (HumuLIN,NovoLIN) injection 10 Units: 10 [IU] | SUBCUTANEOUS | @ 11:00:00 | Stop: 2024-05-12

## 2024-05-12 MED ADMIN — insulin regular (HumuLIN,NovoLIN) inj CORRECTIONAL 0-20 Units: 0-20 [IU] | SUBCUTANEOUS | @ 22:00:00

## 2024-05-12 MED ADMIN — insulin NPH (HumuLIN,NovoLIN) injection 50 Units: 50 [IU] | SUBCUTANEOUS | @ 22:00:00

## 2024-05-12 MED ADMIN — insulin NPH (HumuLIN,NovoLIN) injection 42 Units: 42 [IU] | SUBCUTANEOUS | @ 11:00:00 | Stop: 2024-05-12

## 2024-05-12 MED ADMIN — insulin regular (HumuLIN,NovoLIN) injection 10 Units: 10 [IU] | SUBCUTANEOUS | @ 22:00:00

## 2024-05-12 MED ADMIN — heparin (porcine) 5,000 unit/mL injection 5,000 Units: 5000 [IU] | SUBCUTANEOUS | @ 14:00:00 | Stop: 2024-05-12

## 2024-05-12 MED ADMIN — lactulose oral solution: 20 g | GASTROENTERAL | @ 14:00:00

## 2024-05-12 MED ADMIN — midodrine (PROAMATINE) tablet 5 mg: 5 mg | ORAL | @ 14:00:00

## 2024-05-12 MED ADMIN — midodrine (PROAMATINE) tablet 5 mg: 5 mg | ORAL | @ 22:00:00

## 2024-05-12 MED ADMIN — insulin regular (HumuLIN,NovoLIN) inj CORRECTIONAL 0-20 Units: 0-20 [IU] | SUBCUTANEOUS | @ 11:00:00 | Stop: 2024-05-12

## 2024-05-12 MED ADMIN — levETIRAcetam (KEPPRA) oral solution: 750 mg | GASTROENTERAL | @ 02:00:00

## 2024-05-12 MED ADMIN — multivitamins, therapeutic with minerals tablet 1 tablet: 1 | GASTROENTERAL | @ 14:00:00

## 2024-05-12 MED ADMIN — lactulose oral solution: 20 g | GASTROENTERAL | @ 02:00:00

## 2024-05-12 MED ADMIN — esomeprazole (NEXIUM) granules 40 mg: 40 mg | GASTROENTERAL | @ 14:00:00

## 2024-05-12 MED ADMIN — esomeprazole (NEXIUM) granules 40 mg: 40 mg | GASTROENTERAL | @ 02:00:00

## 2024-05-12 MED ADMIN — levETIRAcetam (KEPPRA) oral solution: 750 mg | GASTROENTERAL | @ 14:00:00

## 2024-05-12 MED ADMIN — potassium chloride (KLOR-CON) packet 40 mEq: 40 meq | ORAL | @ 05:00:00 | Stop: 2024-05-12

## 2024-05-12 MED ADMIN — potassium phosphate 30 mmol in sodium chloride (NS) 0.9 % 500 mL infusion: 30 mmol | INTRAVENOUS | @ 15:00:00 | Stop: 2024-05-12

## 2024-05-12 MED ADMIN — insulin regular (HumuLIN,NovoLIN) inj CORRECTIONAL 0-20 Units: 0-20 [IU] | SUBCUTANEOUS | @ 16:00:00

## 2024-05-12 NOTE — Unmapped (Signed)
 WOCN Consult Services                                                                 Wound Evaluation     Reason for Consult:   - Incontinence Associated Dermatitis  - Initial  - Moisture Associated Skin Damage    Problem List:   Principal Problem:    Shock     Active Problems:    Type 2 diabetes mellitus, with long-term current use of insulin        Hyperlipidemia    Hypertension    Coronary artery disease involving native heart without angina pectoris    Diabetes mellitus       Esophageal varices in cirrhosis       Hepatic encephalopathy       Calcified cerebral meningioma       Portal hypertension       Other chronic pain    OSA (obstructive sleep apnea)    Stage 3a chronic kidney disease (CMS-HCC)    Hematemesis    Assessment: Per EMR Anjolina Lenox Ladouceur is a 58 y.o. female with PMH CAD s/p NSTEMI, CKD, NAS cirrhosis c/b ascites, hepatic encephalopathy and EV s/p recent banding admitted with UGI bleed 2/2 bleeding esophageal ulcers s/p unsusccessful TIPS c/b pulsatile VT s/p shock requiring transfer to [facility] ICU. Course complicated by severe hemorrhagic shock 2/2 gastric bleed now s/p TIPS with course complicated by acute hypoxic respiratory failure, AKI, stress cardiomyopathy and hepatic encephalopathy. Now with slight increase in pressor requirement. Weaning sedation to plan for extubation in the near future.     CWOCN consulted for potential pressure injury to sacrum. Pt seen in MICU, alert with primary nurse at bedside. Upon assessment, pt noted to have a rash to bilateral buttocks with scattered open areas. Recommend to crust open areas with Antifungal Powder and No sting barrier film then protect with zinc paste. Pt noted to have severe intertrigo to her right lower pannus and groin folds, recommend to crust open areas and protect with Mepilex AG. Right paracentesis  site and RLE blisters noted to be weeping large amounts of serous drainage.  Recommend to apply aquacel ag and secure with absorbant dressing. Atypical wound noted to pt's scalp, recommend Dermatology be consulted, LIP notified. Bilateral heels intact. Continue to follow Skin Integrity Protocol and reposition/offload pt frequently.     Surgical Site 05/04/24 Abdomen Lower;Right (Active)   Properties   Placement Date 05/04/24   Placement Time 2300   Present on Admission No   Primary Wound Type Incision   Location Abdomen   Wound Location Orientation Lower;Right   Wound Description (Comments) VIR access for paracentesis   Surgical Site Dressing wound gel, open to air      Assessments 05/12/2024 12:15 PM   Site Assessment Purple/maroon discoloration   Peri-wound Assessment      Edema   Drainage Amount Moderate   Drainage Description Serous   Treatments Cleansed/Irrigation   Dressing Abdominal dressing (ABD);Hydrofiber with silver   Dressing Changed Changed   Dressing Status      Changed       Wound 05/12/24 Irritant Contact Dermatitis Rash/Dermatitis Buttocks (Active)   Properties   Placement Date 05/12/24   Placement Time 1220   Location Buttocks  Primary Wound Type Irritant Con   Secondary Wound Type - Irritant Contact Dermatitis Rash/Dermati      Assessments 05/12/2024 12:15 PM   Wound Image     Dressing Status      Removed   Peri-wound Assessment      Rash;Red;Pink   Odor None   Site Assessment Red   Treatments Zinc based products;Cleansed/Irrigation   Dressing Moisture barrier cream       Wound 05/12/24 Other (Comment) Leg Anterior;Right;Upper (Active)   Properties   Placement Date 05/12/24   Placement Time 1225   Location Leg   Primary Wound Type Other (blister)   Wound Location Orientation Anterior;Right;Upper      Assessments 05/12/2024 12:15 PM   Wound Image     Dressing Status      Changed   Peri-wound Assessment      Edema   Odor None   Site Assessment Intact blood filled blister   Treatments Cleansed/Irrigation   Dressing Hydrofiber with silver;Silicone foam bordered dressing   Dressing Changed Changed       Wound 05/12/24 Irritant Contact Dermatitis Perspiration Pelvis Anterior;Right Intertrigo along pannus (Active)   Properties   Placement Date 05/12/24   Placement Time 1226   Location Pelvis   Primary Wound Type Irritant Con   Secondary Wound Type - Irritant Contact Dermatitis Perspiration   Wound Location Orientation Anterior;Right   Wound Description (Comments) Intertrigo along pannus      Assessments 05/12/2024 12:15 PM   Dressing Status      Changed   Peri-wound Assessment      Denuded   Odor None   Site Assessment Red;Pink   Treatments Cleansed/Irrigation   Dressing Silver foam   Dressing Changed New       Wound 05/12/24 Other (Comment) Head Posterior;Upper (Active)   Properties   Placement Date 05/12/24   Placement Time 1227   Location Head   Primary Wound Type Other   Wound Location Orientation Posterior;Upper      Assessments 05/12/2024 12:15 PM   Wound Image     Dressing Status      No dressing   Peri-wound Assessment      Intact;Dry   Odor None   Site Assessment Pink;Red   Treatments Cleansed/Irrigation   Dressing Open to air         Continence Status:   Incontinence of bladder: Foley in place  Incontinent of bowel: Fecal management system in place    Moisture Associated Skin Damage:   - Incontinence-associated dermatitis (IAD)  - Intertrigo     Lab Results   Component Value Date    WBC 10.4 05/12/2024    HGB 9.3 (L) 05/12/2024    HCT 27.1 (L) 05/12/2024    ESR >130 (H) 06/02/2023    CRP 20.0 (H) 06/02/2023    A1C 5.6 05/04/2024    GLU 317 (H) 05/12/2024    POCGLU 260 (H) 05/12/2024    ALBUMIN  2.6 (L) 05/12/2024    PROT 5.7 05/12/2024     Support Surface:   - Low Air Loss - ICU    Offloading:  Left: Heel Offloading Boot and Pillow  Right: Heel Offloading Boot and Pillow    Type Debridement Completed By WOCN:  N/A    Teaching:  - Moisture management  - Offloading    WOCN Recommendations:   - See nursing orders for wound care instructions.  - Contact WOCN with questions, concerns, or wound deterioration.    Topical Therapy/Interventions:   -  Absorbent Dressing  - Antimicrobial Solutions  - Crusting (stoma powder or antifungal powder)  - Hydrofiber  - Zinc oxide barrier cream     Recommended Consults:  - Dermatology    WOCN Follow Up:  - Weekly    Plan of Care Discussed With:   - Patient  - RN Dallojacono, Marguerite Shiley, RN  - LIP Rexine Cater, MD    Supplies Ordered: Yes Aquacel AG to be ordered by unit    Workup Time:   45 minutes    Debby Fake BSN, RN, [employer]  Wound Ostomy Consult Service   Vocera or [employer] when available

## 2024-05-12 NOTE — Unmapped (Signed)
 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 The patient is seen in consultation at the request of Derry Flock, MD (Medical ICU ([facility])) for decompensated cirrhosis with variceal bleeding.    Patient with significant UGIB likely 2/2 gastric varix that was unable to be controlled endoscopically. Patient underwent successful salvage TIPS and has stopped bleeding. Unfortunately, she was re-intubated (likely iso worsening HE), now extubated and improving. Suspect she will need several days to settle out, focus on mobilization and nutrition.    Recommendations:  - Daily MELD labs  - PT/OT  - Cont TF  - Cont PO PPI BID (home med)  - Continue lactulose  for 3-5 bowel movements daily and rifaximin      MELD 3.0: 25 at 05/12/2024 12:22 PM  MELD-Na: 21 at 05/12/2024 12:22 PM  Calculated from:  Serum Creatinine: 0.92 mg/dL (Using min of 1 mg/dL) at 03/09/8118 14:78 PM  Serum Sodium: 150 mmol/L (Using max of 137 mmol/L) at 05/12/2024 12:22 PM  Total Bilirubin: 14.7 mg/dL at 2/95/6213 08:65 PM  Serum Albumin : 2.5 g/dL at 7/84/6962 95:28 PM  INR(ratio): 1.46 at 05/12/2024  6:48 AM  Age at listing (hypothetical): 57 years  Sex: Female at 05/12/2024 12:22 PM      Issues Impacting Complexity of Management:  -The patient has the need for intensive monitoring parameter(s) due to high-risk of clinical decline: q4h or more frequent monitoring of hemoglobins to monitor for stability of GI bleeding and frequent monitoring of MELD score to monitor for progression to/progression of acute or acute on chronic liver failure    Recommendations discussed with the patient's primary team. We will continue to follow along with you.    Subjective:   - Extubated  - Off pressor  - Labs improving      Objective:   Temp:  [36.8 ??C (98.3 ??F)-37.5 ??C (99.5 ??F)] 36.8 ??C (98.3 ??F)  Pulse:  [85-98] 93  SpO2 Pulse:  [86-98] 96  Resp:  [5-21] 14  BP: (94-103)/(25-32) 94/32  FiO2 (%):  [28 %] 28 %  SpO2:  [89 %-100 %] 95 %    Gen: Ill appearing  HEENT: intubated   Abdomen:  soft, moderately distended     Pertinent Labs & Studies:  -I have reviewed the patient's labs from 05/12/24 which show stable Hgb, worsening LFTs, and stable INR

## 2024-05-12 NOTE — Unmapped (Signed)
 Problem: Adult Inpatient Plan of Care  Goal: Plan of Care Review  Outcome: Progressing  Goal: Patient-Specific Goal (Individualized)  Outcome: Progressing  Goal: Absence of Hospital-Acquired Illness or Injury  Outcome: Progressing  Intervention: Identify and Manage Fall Risk  Recent Flowsheet Documentation  Taken 05/12/2024 0800 by Author Board, RN  Safety Interventions:   aspiration precautions   bed alarm   environmental modification   fall reduction program maintained   lighting adjusted for tasks/safety   low bed   muscle strengthening facilitated   nonskid shoes/slippers when out of bed  Intervention: Prevent Skin Injury  Recent Flowsheet Documentation  Taken 05/12/2024 1800 by Author Board, RN  Positioning for Skin: Supine/Back  Taken 05/12/2024 1600 by Author Board, RN  Positioning for Skin: Right  Taken 05/12/2024 1400 by Author Board, RN  Positioning for Skin: Left  Taken 05/12/2024 1200 by Author Board, RN  Positioning for Skin: Right  Taken 05/12/2024 1000 by Author Board, RN  Positioning for Skin: Left  Taken 05/12/2024 0800 by Author Board, RN  Positioning for Skin: Right  Device Skin Pressure Protection:   absorbent pad utilized/changed   pressure points protected   positioning supports utilized   tubing/devices free from skin contact  Skin Protection:   adhesive use limited   incontinence pads utilized   zinc oxide barrier cream   tubing/devices free from skin contact  Intervention: Prevent Infection  Recent Flowsheet Documentation  Taken 05/12/2024 0800 by Author Board, RN  Infection Prevention:   environmental surveillance performed   hand hygiene promoted   rest/sleep promoted   single patient room provided  Goal: Optimal Comfort and Wellbeing  Outcome: Progressing  Goal: Readiness for Transition of Care  Outcome: Progressing  Goal: Rounds/Family Conference  Outcome: Progressing     Problem: Skin Injury Risk Increased  Goal: Skin Health and Integrity  Outcome: Progressing  Intervention: Optimize Skin Protection  Recent Flowsheet Documentation  Taken 05/12/2024 1800 by Author Board, RN  Head of Bed [facility]) Positioning: HOB at 30 degrees  Taken 05/12/2024 1600 by Author Board, RN  Head of Bed [facility]) Positioning: HOB at 30 degrees  Taken 05/12/2024 1400 by Author Board, RN  Head of Bed [facility]) Positioning: HOB at 30 degrees  Taken 05/12/2024 1200 by Author Board, RN  Pressure Reduction Techniques:   frequent weight shift encouraged   heels elevated off bed   weight shift assistance provided  Head of Bed [facility]) Positioning: HOB at 30 degrees  Taken 05/12/2024 1100 by Author Board, RN  Head of Bed [facility]) Positioning: HOB at 20-30 degrees  Taken 05/12/2024 1000 by Author Board, RN  Pressure Reduction Techniques:   frequent weight shift encouraged   heels elevated off bed   weight shift assistance provided  Head of Bed (HOB) Positioning: HOB at 20-30 degrees  Taken 05/12/2024 0800 by Author Board, RN  Pressure Reduction Techniques:   frequent weight shift encouraged   heels elevated off bed  Head of Bed (HOB) Positioning: HOB at 20-30 degrees  Pressure Reduction Devices:   pressure-redistributing mattress utilized   specialty bed utilized  Skin Protection:   adhesive use limited   incontinence pads utilized   zinc oxide barrier cream   tubing/devices free from skin contact     Problem: Fall Injury Risk  Goal: Absence of Fall and Fall-Related Injury  Outcome: Progressing  Intervention: Promote Scientist, clinical (histocompatibility and immunogenetics) Documentation  Taken 05/12/2024 0800 by Author Board, RN  Safety Interventions:   aspiration precautions  bed alarm   environmental modification   fall reduction program maintained   lighting adjusted for tasks/safety   low bed   muscle strengthening facilitated   nonskid shoes/slippers when out of bed     Problem: Non-Violent Restraints  Goal: Patient will remain free of restraint events  Outcome: Progressing  Goal: Patient will remain free of physical injury  Outcome: Progressing     Problem: Wound  Goal: Optimal Coping  Outcome: Progressing  Goal: Optimal Functional Ability  Outcome: Progressing  Goal: Absence of Infection Signs and Symptoms  Outcome: Progressing  Intervention: Prevent or Manage Infection  Recent Flowsheet Documentation  Taken 05/12/2024 0800 by Author Board, RN  Infection Management: aseptic technique maintained  Goal: Improved Oral Intake  Outcome: Progressing  Goal: Optimal Pain Control and Function  Outcome: Progressing  Intervention: Prevent or Manage Pain  Recent Flowsheet Documentation  Taken 05/12/2024 0800 by Author Board, RN  Sleep/Rest Enhancement:   regular sleep/rest pattern promoted   relaxation techniques promoted   room darkened   noise level reduced   awakenings minimized  Goal: Skin Health and Integrity  Outcome: Progressing  Intervention: Optimize Skin Protection  Recent Flowsheet Documentation  Taken 05/12/2024 1800 by Author Board, RN  Head of Bed [facility]) Positioning: HOB at 30 degrees  Taken 05/12/2024 1600 by Author Board, RN  Head of Bed [facility]) Positioning: HOB at 30 degrees  Taken 05/12/2024 1400 by Author Board, RN  Head of Bed [facility]) Positioning: HOB at 30 degrees  Taken 05/12/2024 1200 by Author Board, RN  Pressure Reduction Techniques:   frequent weight shift encouraged   heels elevated off bed   weight shift assistance provided  Head of Bed [facility]) Positioning: HOB at 30 degrees  Taken 05/12/2024 1100 by Author Board, RN  Head of Bed [facility]) Positioning: HOB at 20-30 degrees  Taken 05/12/2024 1000 by Author Board, RN  Pressure Reduction Techniques:   frequent weight shift encouraged   heels elevated off bed   weight shift assistance provided  Head of Bed (HOB) Positioning: HOB at 20-30 degrees  Taken 05/12/2024 0800 by Author Board, RN  Pressure Reduction Techniques:   frequent weight shift encouraged heels elevated off bed  Head of Bed (HOB) Positioning: HOB at 20-30 degrees  Pressure Reduction Devices:   pressure-redistributing mattress utilized   specialty bed utilized  Skin Protection:   adhesive use limited   incontinence pads utilized   zinc oxide barrier cream   tubing/devices free from skin contact  Goal: Optimal Wound Healing  Outcome: Progressing  Intervention: Promote Wound Healing  Recent Flowsheet Documentation  Taken 05/12/2024 0800 by Author Board, RN  Sleep/Rest Enhancement:   regular sleep/rest pattern promoted   relaxation techniques promoted   room darkened   noise level reduced   awakenings minimized     Problem: Self-Care Deficit  Goal: Improved Ability to Complete Activities of Daily Living  Outcome: Progressing     Problem: Mechanical Ventilation Invasive  Goal: Effective Communication  Outcome: Progressing  Goal: Optimal Device Function  Outcome: Progressing  Intervention: Optimize Device Care and Function  Recent Flowsheet Documentation  Taken 05/12/2024 1400 by Author Board, RN  Oral Care:   mouth swabbed   lip/mouth moisturizer applied   teeth brushed   oral rinse provided  Taken 05/12/2024 0800 by Author Board, RN  Oral Care:   mouth swabbed   oral rinse provided   lip/mouth moisturizer applied   tongue brushed   teeth brushed  Goal: Mechanical Ventilation Liberation  Outcome: Progressing  Intervention: Promote Extubation and Mechanical Ventilation Liberation  Recent Flowsheet Documentation  Taken 05/12/2024 0800 by Author Board, RN  Sleep/Rest Enhancement:   regular sleep/rest pattern promoted   relaxation techniques promoted   room darkened   noise level reduced   awakenings minimized  Goal: Optimal Nutrition Delivery  Outcome: Progressing  Goal: Absence of Device-Related Skin and Tissue Injury  Outcome: Progressing  Intervention: Maintain Skin and Tissue Health  Recent Flowsheet Documentation  Taken 05/12/2024 0800 by Author Board, RN  Device Skin Pressure Protection:   absorbent pad utilized/changed   pressure points protected   positioning supports utilized   tubing/devices free from skin contact  Goal: Absence of Ventilator-Induced Lung Injury  Outcome: Progressing  Intervention: Prevent Ventilator-Associated Pneumonia  Recent Flowsheet Documentation  Taken 05/12/2024 1800 by Author Board, RN  Head of Bed [facility]) Positioning: HOB at 30 degrees  Taken 05/12/2024 1600 by Author Board, RN  Head of Bed [facility]) Positioning: HOB at 30 degrees  Taken 05/12/2024 1400 by Christiano Blandon, Marguerite Shiley, RN  Head of Bed [facility]) Positioning: HOB at 30 degrees  Oral Care:   mouth swabbed   lip/mouth moisturizer applied   teeth brushed   oral rinse provided  Taken 05/12/2024 1200 by Author Board, RN  Head of Bed [facility]) Positioning: HOB at 30 degrees  Taken 05/12/2024 1100 by Author Board, RN  Head of Bed [facility]) Positioning: HOB at 20-30 degrees  Taken 05/12/2024 1000 by Author Board, RN  Head of Bed [facility]) Positioning: HOB at 20-30 degrees  Taken 05/12/2024 0800 by Wai Litt, Marguerite Shiley, RN  Head of Bed (HOB) Positioning: HOB at 20-30 degrees  Oral Care:   mouth swabbed   oral rinse provided   lip/mouth moisturizer applied   tongue brushed   teeth brushed     Problem: Artificial Airway  Goal: Effective Communication  Outcome: Progressing  Goal: Optimal Device Function  Outcome: Progressing  Intervention: Optimize Device Care and Function  Recent Flowsheet Documentation  Taken 05/12/2024 1400 by Author Board, RN  Oral Care:   mouth swabbed   lip/mouth moisturizer applied   teeth brushed   oral rinse provided  Taken 05/12/2024 0800 by Author Board, RN  Aspiration Precautions:   awake/alert before oral intake   respiratory status monitored   upright posture maintained  Oral Care:   mouth swabbed   oral rinse provided   lip/mouth moisturizer applied   tongue brushed   teeth brushed  Goal: Absence of Device-Related Skin or Tissue Injury  Outcome: Progressing  Intervention: Maintain Skin and Tissue Health  Recent Flowsheet Documentation  Taken 05/12/2024 0800 by Author Board, RN  Device Skin Pressure Protection:   absorbent pad utilized/changed   pressure points protected   positioning supports utilized   tubing/devices free from skin contact

## 2024-05-12 NOTE — Unmapped (Signed)
 CVAD Liaison - Total Occlusion note      The CVAD Liaison was paged to assess this patient's central catheter for a total occlusion.  The patency of the occluded line was verified per protocol and found to be totally occluded.  TPA (alteplase ) was instilled into the white lumen per protocol.  New clave was placed and line was flagged per protocol while TPA was allowed to dwell for 2 hours.  Care nurse was notified.    Prior to use, the care nurse will withdraw TPA and check line for patency.  RN will notify CVAD Liaison if further assistance is needed.           Thank you for this consult,  Merwin Ache, RN, CVAD Liaison     Consult Time 15 minutes (min)

## 2024-05-12 NOTE — Unmapped (Signed)
 Cardiology Signoff Note    Cardiology following for new HFrEF, likely Takotsubo cardiomyopathy, and wide complex tachycardia requiring emergent cardioversion, in the setting of hemorrhagic shock related to decompensated cirrhosis. She has been stable from a cardiac perspective.    Recommendations  -OK to discontinue amiodarone . Her WCT was triggered by critical illness from which she is recovering, and was more likely SVT with aberrancy than VT  -No GDMT for now, still requiring norepi  -Would obtain follow-up TTE when close to discharge and page cardiology to discuss results      Discussed with Dr. Ollis Bi, MD  Fellow, Cardiovascular Medicine

## 2024-05-12 NOTE — Unmapped (Signed)
 MICU Daily Progress Note     Date of Service: 05/12/2024    Problem List:   Principal Problem:    Shock     Active Problems:    Type 2 diabetes mellitus, with long-term current use of insulin        Hyperlipidemia    Hypertension    Coronary artery disease involving native heart without angina pectoris    Diabetes mellitus       Esophageal varices in cirrhosis       Hepatic encephalopathy       Calcified cerebral meningioma       Portal hypertension       Other chronic pain    OSA (obstructive sleep apnea)    Stage 3a chronic kidney disease (CMS-HCC)    Hematemesis      Interval history: Ashley Briggs is a 58 y.o. female with PMH CAD s/p NSTEMI, CKD, NAS cirrhosis c/b ascites, hepatic encephalopathy and EV s/p recent banding admitted with UGI bleed 2/2 bleeding esophageal ulcers s/p unsusccessful TIPS c/b pulsatile VT s/p shock requiring transfer to [facility] ICU. Course complicated by severe hemorrhagic shock 2/2 gastric bleed now s/p TIPS with course complicated by acute hypoxic respiratory failure, AKI, stress cardiomyopathy and hepatic encephalopathy. Improving pressor requirement, Hgb stable. Extubated 6/12 and weaned to RA.    24hr events:   - Extubated 6/12, weaned to RA  - Liberalized SBP goal to > 100  - Completed amiodarone  load 6/12  - Increased NPH to 50u BID, regular 10 units QID, SSI QID    Neurological   Sedation (resolved)  Sedation held and extubated 6/12. Now A&O.     History of seizure-like activity (stable)  - Home Keppra  750mg  BID    Chronic pain  History of chronic pain, treated with baclofen , naltrexone , oxycodone .   - Hold home baclofen , naltrextone, oxycodone  in the setting of hypotension and sedation    Major depressive disorder (stable)  - Hold home bupoprion, citalopram     Insomnia (stable)  - Hold home zolpidem     PMH calcified cerebral meningioma (stable)  - no intervention at this time    Analgesia: pain adequately controlled    Pulmonary   Intubation and mechanical ventilation (resolved)  Previously intubated for procedures and altered mental status. Extubated 6/5, re-intubated later the same day for worsening hypoxia. Successful extubated 6/12 and now weaned to RA.     Pulmonary edema (stable)  Increasing hypoxia on 6/5, ultimately requiring re-intubation with FiO2 100%. CXR demonstrated moderate pulmonary edema. Etiology of pulmonary edema most likely cardiogenic given concurrent suspected stress cardiomyopathy, though she is also at risk for ARDS and noncardiogenic pulmonary edema cannot be excluded. Diuresed with improving oxygen requirement, now on room air.   - CTM    Left pleural effusion (stable)  CXR 6/4 remarkable for moderate Left pleural effusion with passive atelectasis. S/p thoracentesis on 6/7. Pleural fluid c/w transudative effusion by protein parameters.   - CTM    Cardiovascular   Undifferentiated shock, hemorrhagic versus septic  Arrived to [facility] MICU in undifferentiated shock requiring 2 pressors. Acutely worsening hypotension on 6/4 requiring fourth pressor, aggressive fluid resuscitation, and massive transfusion protocol. S/p emergent TIPS on 6/4. Isolated fever on 6/6, prerenal AKI, and central venous O2 nearly 90 concerning for some component of septic shock, though infectious workup remains unremarkable, and central venous O2 may be confounded in the setting of cirrhosis.  - Norepi, liberalize to SBP goal > 100   - Follow-up VBG,  lactate with new SBP goal  - Continue midodrine 5mg  TID    C/f stress cardiomyopathy - Demand Ischemia - Type II NSTEMI  With elevated troponin on admission (5.4k) likely 2/2 demand ischemia in the setting of shock. Echo on admission overall normal with EF > 55%. Developed several episodes of wide complex tachycardia on 6/5. Bedside POCUS with apical ballooning and slightly decreased EF to 45-50%. EKG was without acute ischemic changes. Troponin peaked at 23k (6/6). Formal TTE demonstrated reduced EF to 45% with basal and midventricular hypokinesis. Findings favored to represent stress cardiomyopathy rather than ACS in the absence of acute EKG changes. Repeat TTE 6/10 with EF 40%, marginally decreased from prior.   - Cardiology consulted, appreciate recs   - Low concern for true ACS, no need for heparinization or ischemic eval acutely    Wide complex tachycardia - Hx ventricular tachycardia  Developed VT arrest requiring shock at OSH with initial TIPS attempt. On 6/5 developed several episodes of wide complex tachycardia. EKG with tachycardia to 130s, thought by cardiology to represent a fib with abberancy rather than VT. She was stabilized with amiodarone  bolus, followed by amiodarone  gtt and then PO amio. Finished amio load 6/12, no role for amio moving forward as this was precipitated iso critical illness.   - Cardiology consulted, appreciate recs   - Off amiodarone  gtt   - S/p po amiodarone  load  - q8h BMP  - Replete K>4, Mg>2    CAD, prior NSTEMI  Prior NSTEMI per chart review. No LHC data available. Unclear if she has history of coronary stent.   - Home aspirin  held ISO bleeding    Renal   Prerenal AKI, c/f HRS (improving)  Cr elevated to 1.32 from baseline ~0.8. Urine lytes most consistent with prerenal etiology, 2/2 hypoperfusion iso shock, unlikely HRS. Cr slowly improving, now with increasing urine output.   - Management of contributing etiologies as above  - q8h BMP     Infectious Disease/Autoimmune   Undifferentiated shock, c/f septic shock - Fever (resolved) - Leukocytosis (resolved)  Presented to [facility] MICU in 2-pressor shock with leukocytosis to 24.9. UA noninfectious, CXR without pneumonia. Bcx NGTD x 24 hours. Most likely infectious source intra-abdominal. SBP considered, unfortunately ascites from paracentesis on 6/4 were not sent for diagnostic studies. No skin findings on exam to suggest SSTI. Femoral line placed at OSH possibly an infectious source, though Bcx have been NGTD. S/p vanc/zosyn . S/p tobramycin  x 1. With fever to 38.4 on 6/6. Continues now on vanc/zosyn . Paracentesis with PMNs < 250. Thoracentesis transudative. UA noninfectious. Lrcx gram stain with 1+ yeast, 1+ GPC. S/p 6 days of broad spectrum antibiotic coverage thorough workup unremarkable for underlying infectious etiology. Fluctuating single pressor requirement since 6/9, repeat infectious workup negative thus far; low clinical concern for infection at this time.   - Bcx ngtd  - Ucx 6/4 negative  - Paracentesis 6/7 with ANC < 250, thoracentesis transudative  - Lrcx growing oropharyngeal flora  - Deferring further antibiotics iso low concern for infection    Cultures:  Blood Culture, Routine (no units)   Date Value   05/06/2024 No Growth at 5 days   05/06/2024 No Growth at 5 days     Urine Culture, Comprehensive (no units)   Date Value   05/04/2024 NO GROWTH     Lower Respiratory Culture (no units)   Date Value   05/07/2024 OROPHARYNGEAL FLORA ISOLATED   05/06/2024 Specimen Not Processed     WBC (10*9/L)  Date Value   05/11/2024 10.4     WBC, UA (/HPF)   Date Value   05/10/2024 25 (H)          FEN/GI   Decompensated cirrhosis c/b esophageal varices, gastric varix, hepatic encephalopathy, ascites   Presenting with hematemesis 6 days after esophageal banding. Emergent TIPS at OSH not successful d/t inability to access the portal vein. EGD on 6/4 demonstrated nonbleeding esophageal ulcers at site of prior banding, stomach completely occluded by blood which could not be cleared endoscopically. With acute decline in Hb and hypotension on 6/4. Darius Edouard attempted with GI, but unsuccessful after numerous attempts. S/p salvage TIPS on 6/4. Ongoing melena and dark red NGT output with stable Hb felt to represent prior bleeding. Low clinical concern for active GI bleeding.   - S/p salvage TIPS  - Hepatology following  - Daily MELLD labs (CMP, PT/INR)  - Maintain fibrinogen > 100 with cryo  - Home lasix , spironolactone  held d/t hypotension  - Continue rifaximin , continue lactulose  to 20g BID   - FMS in place, goal output 500cc-1L    Acute on chronic liver failure - Ischemic hepatitis (improving)  LFTs continue to rise s/p variceal hemorrhage and TIPS. Unfortunately, precluded from transplant eval with newly reduced EF. LFTs slowly improving, suggesting ongoing recovery from TIPS and hemorrhagic event.  - Hepatology following    Malnutrition Assessment:   Tube feeds started 6/9  Tube feeds:   Novasource Renal at goal rate 20 mL/hr. This provides 840 kcals, 40 g protein, 77 g carbohydrate, 43 g fat, 0 g fiber, 299 mL free water , and meets 42% USRDI.  Goal tube feeds (when norepi <10)  Recommend Novasource Renal at goal rate 30 mL/hr. This provides 1260 kcals, 59 g protein, 115 g carbohydrate, 64 g fat, 0 g fiber, 448 mL free water , and meets 63% USRDI.  Prosource TF 2 pkts BID   Total: 1500 kcal, 119 g protein       Heme/Coag   Blood loss anemia, likely 2/2 refractory portal hypertensive bleeding (stable)  S/p 3 units pRBCs at OSH, Hb 9.3 on arrival to MICU. S/p massive transfusion protocol on 6/4. Low clinical concern for ongoing blood loss with stable Hb, last transfusion 6/4.   - q8h CBC with diff    Mild Thrombocytopenia (stable)  Plts 253k on admission, acute decline to 50k on 6/5. Currently without signs of active bleeding. Plts remain stable around ~100k.  - q8h CBC with diff    Endocrine   Insulin -dependent T2DM  A1c 5.6 on admission. Home regimen lantus  60, lispro with meals. BG control challenging in the setting of critical illness and stress dose steroids. S/p insulin  infusion, converted to NPH 6/8. Ongoing hyperglycemia in the setting of continuous tube feeds, titrating insulin .   - NPH 50 units BID  - Regular insulin  10 units QID  - Regular SSI QID    Integumentary   Psoriasis (stable)  History of psoriasis per chart review. With numerous chronic appearing scattered erythematous plaques with silvery scale. Of note, on Secukinumab  for psoriasis management, which can increase risk for infection.    # Sacral wound, 01/2024  - WOCN consulted for high risk skin assessment Yes.  - cont pressure mitigating precautions per skin policy    Prophylaxis/LDA/Restraints/Consults   ICU checklist completed: yes (see ICU rounding navigator in Epic)    Patient Lines/Drains/Airways Status       Active Active Lines, Drains, & Airways       Name  Placement date Placement time Site Days    CVC Triple Lumen 05/05/24 Non-tunneled Left Internal jugular 05/05/24  1740  Internal jugular  6    NG/OG Tube Right nostril 05/06/24  1930  Right nostril  5    Urethral Catheter 05/04/24  0500  --  8    Peripheral IV 05/04/24 Anterior;Left;Upper Arm 05/04/24  0500  Arm  8    Peripheral IV 05/04/24 Anterior;Right;Upper Arm 05/04/24  0500  Arm  8    Peripheral IV 05/06/24 Right Arm 05/06/24  1716  Arm  5    Arterial Line 05/10/24 Right Radial 05/10/24  1845  Radial  1                  Patient Lines/Drains/Airways Status       Active Wounds       Name Placement date Placement time Site Days    Surgical Site 05/04/24 Abdomen Lower;Right 05/04/24  2300  -- 7    Surgical Site 05/04/24 Neck Right 05/04/24  2300  -- 7    Surgical Site 05/04/24 Groin Right 05/04/24  2300  -- 7                    Goals of Care     Code Status: Full Code    Designated Healthcare Decision Maker:  Ashley Briggs current decisional capacity for healthcare decision-making is Full capacity. Her designated healthcare decision maker(s) is/are   HCDM (patient stated preference): Caudle,Ryan - Relative - [phone].      Subjective     Patient lying in bed this morning. Extubated yesterday. Alert and oriented to self and in part to situation, not oriented to time or place. Asking to eat and drink.    Objective     Vitals - past 24 hours  Temp:  [36.9 ??C (98.5 ??F)-37.5 ??C (99.5 ??F)] 37.5 ??C (99.5 ??F)  Pulse:  [80-103] 94  SpO2 Pulse:  [80-103] 89  Resp:  [5-19] 16  FiO2 (%):  [28 %-30 %] 28 %  SpO2:  [89 %-100 %] 98 % Intake/Output  I/O last 3 completed shifts:  In: 1726.7 [P.O.:120; I.V.:406.7; NG/GT:1200]  Out: 1730 [Urine:1630; Stool:100]     Physical Exam:    General: Ill-appearing female, lying in bed.   HEENT: Mucous membranes dry. Sclerae icteric.   CV: RRR  Pulm: CTAB on anterior exam.  GI: Normoactive bowel sounds. Soft, nondistended.  Extremities: Trace edema BLE.   Skin: Diffuse scattered erythematous plaques with silvery scale. No bruising, petechiae, mucocutaneous bleeding.  Neuro: Alert and oriented to self and in part to situation, disoriented to time and place.     Continuous Infusions:    NORepinephrine  bitartrate-NS 2 mcg/min (05/12/24 0315)       Scheduled Medications:    esomeprazole  40 mg Enteral tube: gastric BID    heparin (porcine) for subcutaneous use  5,000 Units Subcutaneous Q12H [facility]    insulin  NPH  42 Units Subcutaneous Q12H    insulin  regular  0-20 Units Subcutaneous Q4H [facility]    insulin  regular  10 Units Subcutaneous Q12H    lactulose   20 g Enteral tube: gastric BID    levETIRAcetam   750 mg Enteral tube: gastric BID    midodrine  5 mg Oral TID    multivitamins (ADULT)  1 tablet Enteral tube: gastric Daily    sodium chloride   10 mL Intravenous Q8H    sodium chloride   10 mL Intravenous Q8H  sodium chloride   10 mL Intravenous Q8H       PRN medications:  dextrose  in water , glucagon, glucose    Data/Imaging Review: Reviewed in Epic and personally interpreted on 05/12/2024. See EMR for detailed results.    Vallery Gavel, MS4    I attest that I have reviewed the medical student note and that the components of the history of the present illness, the physical exam, and the assessment and plan documented were performed by me or were performed in my presence by the student where I verified the documentation and performed (or re-performed) the exam and medical decision making. Rexine Cater, MD

## 2024-05-12 NOTE — Unmapped (Signed)
 Patient disoriented to all but self. VSS. Titrated of O2. Levo titrated for a systolic > 110. Remains on tube feeds. Blood sugars elevated, insulin  given according to order. 250 output via Fms. Adequate output via foley. SA on tele. Patient awake all night.   Problem: Adult Inpatient Plan of Care  Goal: Plan of Care Review  Outcome: Ongoing - Unchanged  Goal: Patient-Specific Goal (Individualized)  Outcome: Ongoing - Unchanged  Goal: Absence of Hospital-Acquired Illness or Injury  Outcome: Ongoing - Unchanged  Intervention: Prevent Skin Injury  Recent Flowsheet Documentation  Taken 05/12/2024 0600 by Archer Kobs, RN  Positioning for Skin: Left  Taken 05/12/2024 0400 by Archer Kobs, RN  Positioning for Skin: Right  Taken 05/12/2024 0200 by Archer Kobs, RN  Positioning for Skin: Left  Taken 05/12/2024 0000 by Archer Kobs, RN  Positioning for Skin: Right  Taken 05/11/2024 2200 by Archer Kobs, RN  Positioning for Skin: Left  Taken 05/11/2024 2000 by Archer Kobs, RN  Positioning for Skin: Right  Device Skin Pressure Protection:   absorbent pad utilized/changed   adhesive use limited   positioning supports utilized   pressure points protected  Skin Protection:   adhesive use limited   incontinence pads utilized   skin sealant/moisture barrier applied   tubing/devices free from skin contact  Intervention: Prevent Infection  Recent Flowsheet Documentation  Taken 05/11/2024 2000 by Archer Kobs, RN  Infection Prevention:   cohorting utilized   environmental surveillance performed   equipment surfaces disinfected   hand hygiene promoted   personal protective equipment utilized   rest/sleep promoted   single patient room provided  Goal: Optimal Comfort and Wellbeing  Outcome: Ongoing - Unchanged  Goal: Readiness for Transition of Care  Outcome: Ongoing - Unchanged  Goal: Rounds/Family Conference  Outcome: Ongoing - Unchanged     Problem: Skin Injury Risk Increased  Goal: Skin Health and Integrity  Outcome: Ongoing - Unchanged  Intervention: Optimize Skin Protection  Recent Flowsheet Documentation  Taken 05/12/2024 0400 by Archer Kobs, RN  Activity Management: bedrest  Taken 05/12/2024 0000 by Archer Kobs, RN  Activity Management: bedrest  Taken 05/11/2024 2000 by Archer Kobs, RN  Activity Management: bedrest  Pressure Reduction Techniques:   frequent weight shift encouraged   heels elevated off bed   positioned off wounds   pressure points protected   weight shift assistance provided  Head of Bed (HOB) Positioning: HOB at 30-45 degrees  Pressure Reduction Devices:   positioning supports utilized   pressure-redistributing mattress utilized   heel offloading device utilized   specialty bed utilized  Skin Protection:   adhesive use limited   incontinence pads utilized   skin sealant/moisture barrier applied   tubing/devices free from skin contact     Problem: Fall Injury Risk  Goal: Absence of Fall and Fall-Related Injury  Outcome: Ongoing - Unchanged     Problem: Non-Violent Restraints  Goal: Patient will remain free of restraint events  Outcome: Ongoing - Unchanged  Goal: Patient will remain free of physical injury  Outcome: Ongoing - Unchanged     Problem: Wound  Goal: Optimal Coping  Outcome: Ongoing - Unchanged  Goal: Optimal Functional Ability  Outcome: Ongoing - Unchanged  Intervention: Optimize Functional Ability  Recent Flowsheet Documentation  Taken 05/12/2024 0400 by Archer Kobs, RN  Activity Management: bedrest  Taken 05/12/2024 0000 by Archer Kobs, RN  Activity Management: bedrest  Taken 05/11/2024 2000 by Ancel Kass,  August Leavens, RN  Activity Management: bedrest  Goal: Absence of Infection Signs and Symptoms  Outcome: Ongoing - Unchanged  Intervention: Prevent or Manage Infection  Recent Flowsheet Documentation  Taken 05/11/2024 2000 by Archer Kobs, RN  Infection Management: aseptic technique maintained  Goal: Improved Oral Intake  Outcome: Ongoing - Unchanged  Goal: Optimal Pain Control and Function  Outcome: Ongoing - Unchanged  Goal: Skin Health and Integrity  Outcome: Ongoing - Unchanged  Intervention: Optimize Skin Protection  Recent Flowsheet Documentation  Taken 05/12/2024 0400 by Archer Kobs, RN  Activity Management: bedrest  Taken 05/12/2024 0000 by Archer Kobs, RN  Activity Management: bedrest  Taken 05/11/2024 2000 by Archer Kobs, RN  Activity Management: bedrest  Pressure Reduction Techniques:   frequent weight shift encouraged   heels elevated off bed   positioned off wounds   pressure points protected   weight shift assistance provided  Head of Bed (HOB) Positioning: HOB at 30-45 degrees  Pressure Reduction Devices:   positioning supports utilized   pressure-redistributing mattress utilized   heel offloading device utilized   specialty bed utilized  Skin Protection:   adhesive use limited   incontinence pads utilized   skin sealant/moisture barrier applied   tubing/devices free from skin contact  Goal: Optimal Wound Healing  Outcome: Ongoing - Unchanged     Problem: Self-Care Deficit  Goal: Improved Ability to Complete Activities of Daily Living  Outcome: Ongoing - Unchanged     Problem: Mechanical Ventilation Invasive  Goal: Effective Communication  Outcome: Ongoing - Unchanged  Goal: Optimal Device Function  Outcome: Ongoing - Unchanged  Intervention: Optimize Device Care and Function  Recent Flowsheet Documentation  Taken 05/11/2024 2000 by Archer Kobs, RN  Oral Care:   mouth swabbed   oral rinse provided   suction provided   lip/mouth moisturizer applied  Goal: Mechanical Ventilation Liberation  Outcome: Ongoing - Unchanged  Goal: Optimal Nutrition Delivery  Outcome: Ongoing - Unchanged  Goal: Absence of Device-Related Skin and Tissue Injury  Outcome: Ongoing - Unchanged  Intervention: Maintain Skin and Tissue Health  Recent Flowsheet Documentation  Taken 05/11/2024 2000 by Archer Kobs, RN  Device Skin Pressure Protection:   absorbent pad utilized/changed   adhesive use limited   positioning supports utilized   pressure points protected  Goal: Absence of Ventilator-Induced Lung Injury  Outcome: Ongoing - Unchanged  Intervention: Prevent Ventilator-Associated Pneumonia  Recent Flowsheet Documentation  Taken 05/11/2024 2000 by Archer Kobs, RN  Head of Bed [facility]) Positioning: HOB at 30-45 degrees  Oral Care:   mouth swabbed   oral rinse provided   suction provided   lip/mouth moisturizer applied     Problem: Artificial Airway  Goal: Effective Communication  Outcome: Ongoing - Unchanged  Goal: Optimal Device Function  Outcome: Ongoing - Unchanged  Intervention: Optimize Device Care and Function  Recent Flowsheet Documentation  Taken 05/11/2024 2000 by Archer Kobs, RN  Aspiration Precautions:   medication route adjusted   oral hygiene care promoted   respiratory status monitored   upright posture maintained   tube feeding placement verified  Oral Care:   mouth swabbed   oral rinse provided   suction provided   lip/mouth moisturizer applied  Goal: Absence of Device-Related Skin or Tissue Injury  Outcome: Ongoing - Unchanged  Intervention: Maintain Skin and Tissue Health  Recent Flowsheet Documentation  Taken 05/11/2024 2000 by Archer Kobs, RN  Device Skin Pressure Protection:   absorbent pad utilized/changed  adhesive use limited   positioning supports utilized   pressure points protected

## 2024-05-12 NOTE — Unmapped (Signed)
 Speech Language Pathology Clinical Swallow Assessment  Evaluation (05/12/24 1030)    Patient Name:  Ashley Briggs       Medical Record Number: [id number]   Date of Birth: [birth year]  Sex: Female            SLP Treatment Diagnosis:  r/o oropharyngeal dysphagia  Activity Tolerance: Patient tolerated treatment well    Assessment  Bedside swallow evaluation notable for mildly hoarse voice, generalized weakness, and intermittent overt clinical s/sx of aspiration with ice chips and small sips of water . Recommend npo other than ice chips and will follow closely for serial bedside swallow evaluations.     Recommendations:  NPO, Consider ice chips PRN    Follow-up Referral Recommendations : Skilled SLP services to treat dysphagia and address aspiration risk factors     Recommended Form of Medications: Feeding tube      Recommended Compensatory Techniques : Upright 90 degrees    Post Acute Discharge Recommendations  Post Acute SLP Discharge Recommendations: Skilled SLP services indicated, 5x weekly    Prognosis: Good  Positive Indicators: + alert, tolerating extubation    Plan of Care  SLP Daily Frequency: 1x per day, 2-3 days per week  Planned Treatment Duration : 06/02/24    Treatment Goals:  Short Term Goal 1: Pt will tolerate least restrictive diet without clinical s/sx of aspiration   Time Frame : 2 weeks     Patient and Family Goal: to keep getting better    Subjective  Medical Updates Since Last Visit/Relevant PMH Affecting Clinical Decision Making: Ashley Briggs is a 58 y.o. female with PMH CAD s/p NSTEMI, CKD, NAS cirrhosis c/b ascites, hepatic encephalopathy and EV s/p recent banding admitted with UGI bleed 2/2 bleeding esophageal ulcers s/p unsusccessful TIPS c/b pulsatile VT s/p shock requiring transfer to [facility] ICU. Course complicated by severe hemorrhagic shock 2/2 gastric bleed now s/p TIPS with course complicated by acute hypoxic respiratory failure, AKI, stress cardiomyopathy and hepatic encephalopathy. Now with slight increase in pressor requirement. Weaning sedation to plan for extubation in the near future (extubated 6/11). 24hr events:   - Ongoing hyperglycemia, increased NPH to 42u BID and started regular insulin  10u BID  - Transitioned to p.o. amiodarone   - Started midodrine 5mg  TID, SBP goal > 110  - Repeat TTE with LVEF 40%  - Mental status improving  - Improving pressor requirement. Seen today for bedside swallow evaluation. Mildly lethargic but participating, following simple commands and verbalizing basic needs with slow but intelligible speech.    Communication Preference: Verbal  Patient/Caregiver Reports: agreeable to visit; RN reports intermittent coughing with ice chips  Pain: no s/s of pain      Allergies: Patient has no known allergies.  Current Medications[1]  Past Medical History[2]  Family History[3]  Past Surgical History[4]  Social History     Tobacco Use    Smoking status: Never     Passive exposure: Current    Smokeless tobacco: Never   Substance Use Topics    Alcohol use: Not Currently     Comment: No longer         General:  Medical Tests / Procedures Comments: CXR 6/6: Unchanged interstitial/mild alveolar pulmonary edema with small bilateral pleural effusions, left larger than right.  Equipment/Environment: NGT    Precautions / Restrictions  Precautions: Falls precautions, Aspiration precautions    Objective  Respiratory Status : Room air  History of Intubation: Yes  Length of Intubations (days): 8 days  Date extubated:  05/11/24    Behavior/Cognition: Alert, Cooperative, Pleasant mood  Positioning : Upright in bed    Oral / Motor Exam  Vocal Quality: Dysphonic (mildly breathy)  Volitional Swallow: Within Functional Limits (managing oral secretions)   Labial ROM: Within Functional Limits   Labial Symmetry: Within Functional Limits  Labial Strength: Reduced (generalized weakness)   Lingual ROM: Within Functional Limits  Lingual Symmetry: Within Functional Limits  Lingual Strength: Reduced (generalized weakness)      Velum: elevation grossly WNL   Mandible: Within Functional Limits  Coordination: grossly intact  Facial ROM: Within Functional Limits   Facial Symmetry: Within Functional Limits  Facial Strength: Within Functional Limits      Vocal Intensity: Mildly decreased       Apraxia: None present   Dysarthria: Minimal Dysarthria   Intelligibility: Intelligible   Breath Support: Adequate for speech   Dentition: Adequate    Consistencies assessed: ice chips by spoon; thin liquids (water ) by teaspoon, straw siphon, and straw sip; purees by spoon    Patient at end of session: All needs in reach, In bed, Lines intact, Notified Nurse    Speech Therapy Session Duration  SLP Individual [mins]: 18    I attest that I have reviewed the above information.  Signed: Kerstin Peeling, CCC-SLP    Filed 05/12/2024         [1]   Current Facility-Administered Medications   Medication Dose Route Frequency Provider Last Rate Last Admin    dextrose  50 % in water  (D50W) 50 % solution 12.5 g  12.5 g Intravenous Q15 Min PRN Taylor, Savannah J, MD        [START ON 05/13/2024] enoxaparin  (LOVENOX ) syringe 40 mg  40 mg Subcutaneous Q24H Rexine Cater, MD        esomeprazole (NEXIUM) granules 40 mg  40 mg Enteral tube: gastric BID Sperling, Gabriel, MD   40 mg at 05/12/24 0941    glucagon injection 1 mg  1 mg Intramuscular Once PRN Taylor, Savannah J, MD        glucose chewable tablet 16 g  16 g Oral Q10 Min PRN Taylor, Savannah J, MD        insulin  NPH (HumuLIN ,NovoLIN ) injection 50 Units  50 Units Subcutaneous Q12H Rexine Cater, MD        insulin  regular (HumuLIN ,NovoLIN ) inj CORRECTIONAL 0-20 Units  0-20 Units Subcutaneous Q6H [facility] Stephenson, Connor D, MD   6 Units at 05/12/24 1222    insulin  regular (HumuLIN ,NovoLIN ) injection 10 Units  10 Units Subcutaneous Q6H Stephenson, Connor D, MD   10 Units at 05/12/24 1223    lactulose  oral solution  20 g Enteral tube: gastric BID Rexine Cater, MD   20 g at 05/12/24 0941    levETIRAcetam  (KEPPRA ) oral solution  750 mg Enteral tube: gastric BID Rexine Cater, MD   750 mg at 05/12/24 0941    midodrine (PROAMATINE) tablet 5 mg  5 mg Oral TID Annamae Kick, MD   5 mg at 05/12/24 1222    multivitamins, therapeutic with minerals tablet 1 tablet  1 tablet Enteral tube: gastric Daily Rexine Cater, MD   1 tablet at 05/12/24 0941    NORepinephrine  8 mg in dextrose  5 % 250 mL (32 mcg/mL) infusion PMB  0-40 mcg/min Intravenous Continuous Stephenson, Connor D, MD 1.9 mL/hr at 05/12/24 1130 1 mcg/min at 05/12/24 1130    potassium phosphate 30 mmol in sodium chloride  (NS) 0.9 % 500 mL infusion  30 mmol Intravenous Once  Shelba Desanctis, MD   Stopped at 05/12/24 1455    sodium chloride  (NS) 0.9 % flush 10 mL  10 mL Intravenous Q8H Lawence Press A, MD   10 mL at 05/12/24 0400    sodium chloride  (NS) 0.9 % flush 10 mL  10 mL Intravenous Q8H Macarthur Savory, MD   10 mL at 05/12/24 1141    sodium chloride  (NS) 0.9 % flush 10 mL  10 mL Intravenous Q8H Macarthur Savory, MD   10 mL at 05/12/24 1141     Facility-Administered Medications Ordered in Other Encounters   Medication Dose Route Frequency Provider Last Rate Last Admin    phenylephrine  1 mg/10 mL (100 mcg/mL) injection Syrg   Intravenous PRN one-step-med Davis, Jacob C, MD   100 mcg at 05/05/24 1805    Propofol  (DIPRIVAN ) injection   Intravenous PRN one-step-med Davis, Jacob C, MD   40 mg at 05/05/24 1800    ROCuronium  (ZEMURON ) injection   Intravenous PRN one-step-med Davis, Jacob C, MD   100 mg at 05/05/24 1800   [2]   Past Medical History:  Diagnosis Date    Anemia     Angular blepharoconjunctivitis of both eyes 12/21/2020    Anxiety     Arthritis     Ascites     Blepharitis     Calcified cerebral meningioma    08/16/2022    Cataract associated with type 2 diabetes mellitus    12/21/2020    Chronic headaches     Chronic pain     Chronic pain disorder 2029    I have multiple issues. Seeing doctor's regarding  issues Cirrhosis        Coronary artery disease involving native heart without angina pectoris 02/05/2021    Current moderate episode of major depressive disorder (CMS-HCC) 11/06/2022    Diabetes mellitus        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 type 2 diabetes mellitus    12/02/2022    Dry eye syndrome, bilateral 12/21/2020    Dysphagia 02/02/2023    Endometrial cancer    2007    Esophageal varices in cirrhosis    06/17/2022    Fractures 01/29/2018    Gastroparesis     Generalized pruritus 02/02/2023    GERD (gastroesophageal reflux disease) It's  been going on for years    I take medication  which helps tremendously    Glaucoma 2023    I see a doctor on a regular basis    H/O diabetic gastroparesis 01/09/2021    Heart disease     Hemorrhage of rectum and anus 03/17/2011    Hepatic cirrhosis    06/17/2022    Hepatic encephalopathy    06/17/2022    Hepatic steatosis 03/13/2021    Noted on CT Fall 2021    High cholesterol     History of encephalopathy     Hx of psoriasis 08/06/2022    Hyperlipidemia 12/12/2015    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    Liver disease     Major depression     Metabolic dysfunction-associated steatohepatitis (MASH)     Morbid obesity (CMS-HCC) 11/10/2019    Myopia of both eyes with astigmatism and presbyopia 12/02/2022    Nausea 09/22/2022    Non-alcoholic fatty liver disease     NPDR (nonproliferative diabetic retinopathy)        Right eye    Other ascites 09/22/2022  Other chronic pain 11/06/2022    Other insomnia 11/06/2022    PFD (pelvic floor dysfunction) 04/07/2013    Portal hypertension    10/08/2022    Psoriasis     Ptosis of eyelid, left     Left upper eyelid    Reflux     Retinopathy of left eye, background, proliferative 01/09/2021    Right upper quadrant abdominal pain 09/22/2022    Secondary esophageal varices without bleeding    06/17/2022    Type 2 diabetes mellitus, with long-term current use of insulin  12/12/2015    Last Assessment & Plan:   The current medical regimen is effective;  continue present plan and medications.  Patient will continue good management of diabetes especially with weight loss. Hopefully with further weight loss will run into the problem of being overmedicated and having to cut back on medications     Some confusion on diabetes medications was have not.been refilled for over a year.  Pa   [3]   Family History  Problem Relation Age of Onset    Diabetes Mother     Cancer Mother         Complications during dialysis    Depression Mother     Hypertension Mother     Vision loss Mother     No Known Problems Father     No Known Problems Brother     Diabetes Maternal Grandmother     Hypertension Maternal Grandmother     Alcohol abuse Maternal Grandmother         Heavy drinker    Arthritis Maternal Grandmother     Cancer Maternal Grandfather     Diabetes Maternal Grandfather     Alcohol abuse Maternal Grandfather     Heart disease Maternal Grandfather     No Known Problems Paternal Grandmother     Skin cancer Paternal Grandfather     Stroke Paternal Grandfather     Vision loss Maternal Aunt         Heart Attack    No Known Problems Maternal Uncle     No Known Problems Paternal Aunt     No Known Problems Paternal Uncle     No Known Problems Other     Melanoma Neg Hx     Basal cell carcinoma Neg Hx     Squamous cell carcinoma Neg Hx     Substance Abuse Disorder Neg Hx     Drug abuse Neg Hx     Mental illness Neg Hx     Amblyopia Neg Hx     Blindness Neg Hx     Cataracts Neg Hx     Glaucoma Neg Hx     Macular degeneration Neg Hx     Retinal detachment Neg Hx     Strabismus Neg Hx     Thyroid disease Neg Hx     Breast cancer Neg Hx    [4]   Past Surgical History:  Procedure Laterality Date    CHOLECYSTECTOMY      HYSTERECTOMY      endometrial cancer    HYSTERECTOMY      IR TIPS  05/04/2024    IR TIPS 05/04/2024 Tyra Galley L IMG [facility]    OOPHORECTOMY      OTHER SURGICAL HISTORY  Hysterectomy was in 2007. Gallbladder was in 1999 and phrenectomy  was in 2024    Hysterectomy,  gallbladder removed and phrenectomy    PR ANAL PRESSURE RECORD Left 03/31/2013  Procedure: ANORECTAL MANOMETRY;  Surgeon: None None;  [facility];  Service: Gastroenterology    PR BREATH HYDROGEN TEST N/A 03/31/2013    Procedure: BREATH HYDROGEN TEST;  Surgeon: None None;  [facility];  Service: Gastroenterology    PR COLSC FLX W/RMVL OF TUMOR POLYP LESION SNARE TQ N/A 09/06/2020    Procedure: COLONOSCOPY FLEX; W/REMOV TUMOR/LES BY SNARE;  Surgeon: Benedetto Brady, MD;  [facility];  Service: Gastroenterology    PR UPPER GI ENDOSCOPY,BIOPSY N/A 04/15/2013    Procedure: UGI ENDOSCOPY; WITH BIOPSY, SINGLE OR MULTIPLE;  Surgeon: Allene Ards, MD;  [facility] [place];  Service: Gastroenterology    PR UPPER GI ENDOSCOPY,BIOPSY N/A 09/06/2020    Procedure: UGI ENDOSCOPY; WITH BIOPSY, SINGLE OR MULTIPLE;  Surgeon: Benedetto Brady, MD;  [facility];  Service: Gastroenterology    PR UPPER GI ENDOSCOPY,BIOPSY N/A 05/05/2022    Procedure: UGI ENDOSCOPY; WITH BIOPSY, SINGLE OR MULTIPLE;  Surgeon: Benedetto Brady, MD;  [facility];  Service: Gastroenterology    PR UPPER GI ENDOSCOPY,BIOPSY N/A 01/22/2023    Procedure: UGI ENDOSCOPY; WITH BIOPSY, SINGLE OR MULTIPLE;  Surgeon: Celestia Colander, MD;  [facility];  Service: Gastroenterology

## 2024-05-13 LAB — CBC W/ AUTO DIFF
BASOPHILS ABSOLUTE COUNT: 0 10*9/L (ref 0.0–0.1)
BASOPHILS ABSOLUTE COUNT: 0 10*9/L (ref 0.0–0.1)
BASOPHILS ABSOLUTE COUNT: 0.1 10*9/L (ref 0.0–0.1)
BASOPHILS RELATIVE PERCENT: 0.2 %
BASOPHILS RELATIVE PERCENT: 0.2 %
BASOPHILS RELATIVE PERCENT: 0.5 %
EOSINOPHILS ABSOLUTE COUNT: 0.3 10*9/L (ref 0.0–0.5)
EOSINOPHILS ABSOLUTE COUNT: 0.3 10*9/L (ref 0.0–0.5)
EOSINOPHILS ABSOLUTE COUNT: 0.3 10*9/L (ref 0.0–0.5)
EOSINOPHILS RELATIVE PERCENT: 2.1 %
EOSINOPHILS RELATIVE PERCENT: 3.1 %
EOSINOPHILS RELATIVE PERCENT: 2.4 %
HEMATOCRIT: 28.5 % — ABNORMAL LOW (ref 34.0–44.0)
HEMATOCRIT: 27.1 % — ABNORMAL LOW (ref 34.0–44.0)
HEMATOCRIT: 26.4 % — ABNORMAL LOW (ref 34.0–44.0)
HEMOGLOBIN: 9.5 g/dL — ABNORMAL LOW (ref 11.3–14.9)
HEMOGLOBIN: 9 g/dL — ABNORMAL LOW (ref 11.3–14.9)
HEMOGLOBIN: 8.6 g/dL — ABNORMAL LOW (ref 11.3–14.9)
LYMPHOCYTES ABSOLUTE COUNT: 1.2 10*9/L (ref 1.1–3.6)
LYMPHOCYTES ABSOLUTE COUNT: 1.1 10*9/L (ref 1.1–3.6)
LYMPHOCYTES ABSOLUTE COUNT: 1 10*9/L — ABNORMAL LOW (ref 1.1–3.6)
LYMPHOCYTES RELATIVE PERCENT: 7.7 %
LYMPHOCYTES RELATIVE PERCENT: 9.8 %
LYMPHOCYTES RELATIVE PERCENT: 9 %
MEAN CORPUSCULAR HEMOGLOBIN CONC: 33.4 g/dL (ref 32.0–36.0)
MEAN CORPUSCULAR HEMOGLOBIN CONC: 33.3 g/dL (ref 32.0–36.0)
MEAN CORPUSCULAR HEMOGLOBIN CONC: 32.7 g/dL (ref 32.0–36.0)
MEAN CORPUSCULAR HEMOGLOBIN: 29.4 pg (ref 25.9–32.4)
MEAN CORPUSCULAR HEMOGLOBIN: 29.9 pg (ref 25.9–32.4)
MEAN CORPUSCULAR HEMOGLOBIN: 30 pg (ref 25.9–32.4)
MEAN CORPUSCULAR VOLUME: 88.1 fL (ref 77.6–95.7)
MEAN CORPUSCULAR VOLUME: 89.9 fL (ref 77.6–95.7)
MEAN CORPUSCULAR VOLUME: 91.5 fL (ref 77.6–95.7)
MEAN PLATELET VOLUME: 9.8 fL (ref 6.8–10.7)
MEAN PLATELET VOLUME: 9.9 fL (ref 6.8–10.7)
MEAN PLATELET VOLUME: 10.2 fL (ref 6.8–10.7)
MONOCYTES ABSOLUTE COUNT: 2.1 10*9/L — ABNORMAL HIGH (ref 0.3–0.8)
MONOCYTES ABSOLUTE COUNT: 1.5 10*9/L — ABNORMAL HIGH (ref 0.3–0.8)
MONOCYTES ABSOLUTE COUNT: 1.4 10*9/L — ABNORMAL HIGH (ref 0.3–0.8)
MONOCYTES RELATIVE PERCENT: 13.7 %
MONOCYTES RELATIVE PERCENT: 14.2 %
MONOCYTES RELATIVE PERCENT: 12.5 %
NEUTROPHILS ABSOLUTE COUNT: 11.5 10*9/L — ABNORMAL HIGH (ref 1.8–7.8)
NEUTROPHILS ABSOLUTE COUNT: 7.9 10*9/L — ABNORMAL HIGH (ref 1.8–7.8)
NEUTROPHILS ABSOLUTE COUNT: 8.7 10*9/L — ABNORMAL HIGH (ref 1.8–7.8)
NEUTROPHILS RELATIVE PERCENT: 76.3 %
NEUTROPHILS RELATIVE PERCENT: 72.7 %
NEUTROPHILS RELATIVE PERCENT: 75.6 %
PLATELET COUNT: 130 10*9/L — ABNORMAL LOW (ref 150–450)
PLATELET COUNT: 112 10*9/L — ABNORMAL LOW (ref 150–450)
PLATELET COUNT: 124 10*9/L — ABNORMAL LOW (ref 150–450)
RED BLOOD CELL COUNT: 3.24 10*12/L — ABNORMAL LOW (ref 3.95–5.13)
RED BLOOD CELL COUNT: 3.02 10*12/L — ABNORMAL LOW (ref 3.95–5.13)
RED BLOOD CELL COUNT: 2.88 10*12/L — ABNORMAL LOW (ref 3.95–5.13)
RED CELL DISTRIBUTION WIDTH: 18.8 % — ABNORMAL HIGH (ref 12.2–15.2)
RED CELL DISTRIBUTION WIDTH: 19.1 % — ABNORMAL HIGH (ref 12.2–15.2)
RED CELL DISTRIBUTION WIDTH: 19.5 % — ABNORMAL HIGH (ref 12.2–15.2)
WBC ADJUSTED: 15.1 10*9/L — ABNORMAL HIGH (ref 3.6–11.2)
WBC ADJUSTED: 10.8 10*9/L (ref 3.6–11.2)
WBC ADJUSTED: 11.5 10*9/L — ABNORMAL HIGH (ref 3.6–11.2)

## 2024-05-13 LAB — COMPREHENSIVE METABOLIC PANEL
ALBUMIN: 2.6 g/dL — ABNORMAL LOW (ref 3.4–5.0)
ALBUMIN: 2.4 g/dL — ABNORMAL LOW (ref 3.4–5.0)
ALBUMIN: 2.3 g/dL — ABNORMAL LOW (ref 3.4–5.0)
ALKALINE PHOSPHATASE: 121 U/L — ABNORMAL HIGH (ref 46–116)
ALKALINE PHOSPHATASE: 104 U/L (ref 46–116)
ALKALINE PHOSPHATASE: 105 U/L (ref 46–116)
ALT (SGPT): 155 U/L — ABNORMAL HIGH (ref 10–49)
ALT (SGPT): 131 U/L — ABNORMAL HIGH (ref 10–49)
ALT (SGPT): 118 U/L — ABNORMAL HIGH (ref 10–49)
ANION GAP: 13 mmol/L (ref 5–14)
ANION GAP: 8 mmol/L (ref 5–14)
ANION GAP: 9 mmol/L (ref 5–14)
AST (SGOT): 88 U/L — ABNORMAL HIGH (ref <=34)
AST (SGOT): 84 U/L — ABNORMAL HIGH (ref <=34)
AST (SGOT): 74 U/L — ABNORMAL HIGH (ref <=34)
BILIRUBIN TOTAL: 15.2 mg/dL — ABNORMAL HIGH (ref 0.3–1.2)
BILIRUBIN TOTAL: 13.7 mg/dL — ABNORMAL HIGH (ref 0.3–1.2)
BILIRUBIN TOTAL: 13.3 mg/dL — ABNORMAL HIGH (ref 0.3–1.2)
BLOOD UREA NITROGEN: 56 mg/dL — ABNORMAL HIGH (ref 9–23)
BLOOD UREA NITROGEN: 53 mg/dL — ABNORMAL HIGH (ref 9–23)
BLOOD UREA NITROGEN: 53 mg/dL — ABNORMAL HIGH (ref 9–23)
BUN / CREAT RATIO: 72
BUN / CREAT RATIO: 68
BUN / CREAT RATIO: 69
CALCIUM: 9.6 mg/dL (ref 8.7–10.4)
CALCIUM: 9.2 mg/dL (ref 8.7–10.4)
CALCIUM: 9.2 mg/dL (ref 8.7–10.4)
CHLORIDE: 113 mmol/L — ABNORMAL HIGH (ref 98–107)
CHLORIDE: 115 mmol/L — ABNORMAL HIGH (ref 98–107)
CHLORIDE: 112 mmol/L — ABNORMAL HIGH (ref 98–107)
CO2: 25 mmol/L (ref 20.0–31.0)
CO2: 28 mmol/L (ref 20.0–31.0)
CO2: 25 mmol/L (ref 20.0–31.0)
CREATININE: 0.78 mg/dL (ref 0.55–1.02)
CREATININE: 0.78 mg/dL (ref 0.55–1.02)
CREATININE: 0.77 mg/dL (ref 0.55–1.02)
EGFR CKD-EPI (2021) FEMALE: 88 mL/min/1.73m2 (ref >=60)
EGFR CKD-EPI (2021) FEMALE: 88 mL/min/1.73m2 (ref >=60)
EGFR CKD-EPI (2021) FEMALE: 89 mL/min/1.73m2 (ref >=60)
GLUCOSE RANDOM: 322 mg/dL — ABNORMAL HIGH (ref 70–179)
GLUCOSE RANDOM: 307 mg/dL — ABNORMAL HIGH (ref 70–179)
GLUCOSE RANDOM: 331 mg/dL — ABNORMAL HIGH (ref 70–179)
POTASSIUM: 3.9 mmol/L (ref 3.4–4.8)
POTASSIUM: 4.6 mmol/L (ref 3.4–4.8)
POTASSIUM: 4.2 mmol/L (ref 3.4–4.8)
PROTEIN TOTAL: 6.2 g/dL (ref 5.7–8.2)
PROTEIN TOTAL: 6 g/dL (ref 5.7–8.2)
PROTEIN TOTAL: 5.6 g/dL — ABNORMAL LOW (ref 5.7–8.2)
SODIUM: 151 mmol/L — ABNORMAL HIGH (ref 135–145)
SODIUM: 151 mmol/L — ABNORMAL HIGH (ref 135–145)
SODIUM: 146 mmol/L — ABNORMAL HIGH (ref 135–145)

## 2024-05-13 LAB — FIBRINOGEN: FIBRINOGEN LEVEL: 152 mg/dL — ABNORMAL LOW (ref 175–500)

## 2024-05-13 LAB — MAGNESIUM
MAGNESIUM: 2.7 mg/dL — ABNORMAL HIGH (ref 1.6–2.6)
MAGNESIUM: 2.5 mg/dL (ref 1.6–2.6)
MAGNESIUM: 2.4 mg/dL (ref 1.6–2.6)

## 2024-05-13 LAB — BLOOD GAS, ARTERIAL
BASE EXCESS ARTERIAL: 2.1 — ABNORMAL HIGH (ref -2.0–2.0)
HCO3 ARTERIAL: 25 mmol/L (ref 22–27)
O2 SATURATION ARTERIAL: 98.4 % (ref 94.0–100.0)
PCO2 ARTERIAL: 31.6 mmHg — ABNORMAL LOW (ref 35.0–45.0)
PH ARTERIAL: 7.51 — ABNORMAL HIGH (ref 7.35–7.45)
PO2 ARTERIAL: 87.9 mmHg (ref 80.0–110.0)

## 2024-05-13 LAB — PHOSPHORUS
PHOSPHORUS: 1 mg/dL — ABNORMAL LOW (ref 2.4–5.1)
PHOSPHORUS: 2.6 mg/dL (ref 2.4–5.1)
PHOSPHORUS: 1.9 mg/dL — ABNORMAL LOW (ref 2.4–5.1)

## 2024-05-13 LAB — ALBUMIN / CREATININE URINE RATIO
ALBUMIN QUANT URINE: 7.2 mg/dL
ALBUMIN/CREATININE RATIO: 117.8 ug/mg — ABNORMAL HIGH (ref 0.0–30.0)
CREATININE, URINE: 61.1 mg/dL

## 2024-05-13 LAB — PROTIME-INR
INR: 1.34
INR: 1.47
PROTIME: 15.3 s — ABNORMAL HIGH (ref 9.9–12.6)
PROTIME: 16.8 s — ABNORMAL HIGH (ref 9.9–12.6)

## 2024-05-13 LAB — SLIDE REVIEW

## 2024-05-13 LAB — BILIRUBIN, DIRECT: BILIRUBIN DIRECT: 9.6 mg/dL — ABNORMAL HIGH (ref 0.00–0.30)

## 2024-05-13 LAB — LIPASE: LIPASE: 238 U/L — ABNORMAL HIGH (ref 12–53)

## 2024-05-13 MED ADMIN — sodium chloride (NS) 0.9 % flush 10 mL: 10 mL | INTRAVENOUS | @ 17:00:00

## 2024-05-13 MED ADMIN — midodrine (PROAMATINE) tablet 5 mg: 5 mg | ORAL | @ 12:00:00 | Stop: 2024-05-13

## 2024-05-13 MED ADMIN — insulin regular (HumuLIN,NovoLIN) injection 15 Units: 15 [IU] | SUBCUTANEOUS | @ 22:00:00

## 2024-05-13 MED ADMIN — insulin regular (HumuLIN,NovoLIN) injection 10 Units: 10 [IU] | SUBCUTANEOUS | @ 11:00:00 | Stop: 2024-05-13

## 2024-05-13 MED ADMIN — lactulose oral solution: 20 g | GASTROENTERAL | @ 12:00:00

## 2024-05-13 MED ADMIN — midodrine (PROAMATINE) tablet 10 mg: 10 mg | ORAL | @ 22:00:00

## 2024-05-13 MED ADMIN — melatonin tablet 3 mg: 3 mg | ORAL | @ 01:00:00

## 2024-05-13 MED ADMIN — levETIRAcetam (KEPPRA) oral solution: 750 mg | GASTROENTERAL | @ 12:00:00

## 2024-05-13 MED ADMIN — sodium chloride (NS) 0.9 % flush 10 mL: 10 mL | INTRAVENOUS | @ 08:00:00

## 2024-05-13 MED ADMIN — insulin regular (HumuLIN,NovoLIN) inj CORRECTIONAL 0-20 Units: 0-20 [IU] | SUBCUTANEOUS | @ 17:00:00

## 2024-05-13 MED ADMIN — sodium chloride (NS) 0.9 % flush 10 mL: 10 mL | INTRAVENOUS

## 2024-05-13 MED ADMIN — hydrOXYzine (ATARAX) tablet 25 mg: 25 mg | ORAL | @ 06:00:00

## 2024-05-13 MED ADMIN — ursodiol (ACTIGALL) oral suspension: 300 mg | GASTROENTERAL | @ 12:00:00

## 2024-05-13 MED ADMIN — enoxaparin (LOVENOX) syringe 40 mg: 40 mg | SUBCUTANEOUS | @ 12:00:00

## 2024-05-13 MED ADMIN — clobetasol (TEMOVATE) 0.05 % ointment: TOPICAL | @ 21:00:00

## 2024-05-13 MED ADMIN — alteplase (ACTIVase) injection small catheter clearance 1 mg: 1 mg | INTRAVENOUS | @ 02:00:00 | Stop: 2024-05-12

## 2024-05-13 MED ADMIN — insulin regular (HumuLIN,NovoLIN) inj CORRECTIONAL 0-20 Units: 0-20 [IU] | SUBCUTANEOUS | @ 05:00:00

## 2024-05-13 MED ADMIN — midodrine (PROAMATINE) tablet 10 mg: 10 mg | ORAL | @ 16:00:00

## 2024-05-13 MED ADMIN — insulin NPH (HumuLIN,NovoLIN) injection 60 Units: 60 [IU] | SUBCUTANEOUS | @ 22:00:00

## 2024-05-13 MED ADMIN — insulin NPH (HumuLIN,NovoLIN) injection 50 Units: 50 [IU] | SUBCUTANEOUS | @ 11:00:00 | Stop: 2024-05-13

## 2024-05-13 MED ADMIN — insulin regular (HumuLIN,NovoLIN) injection 10 Units: 10 [IU] | SUBCUTANEOUS | @ 05:00:00 | Stop: 2024-05-13

## 2024-05-13 MED ADMIN — insulin regular (HumuLIN,NovoLIN) inj CORRECTIONAL 0-20 Units: 0-20 [IU] | SUBCUTANEOUS | @ 11:00:00

## 2024-05-13 MED ADMIN — insulin regular (HumuLIN,NovoLIN) inj CORRECTIONAL 0-20 Units: 0-20 [IU] | SUBCUTANEOUS | @ 22:00:00

## 2024-05-13 MED ADMIN — insulin regular (HumuLIN,NovoLIN) injection 15 Units: 15 [IU] | SUBCUTANEOUS | @ 17:00:00

## 2024-05-13 MED ADMIN — NORepinephrine 8 mg in dextrose 5 % 250 mL (32 mcg/mL) infusion PMB: 0-40 ug/min | INTRAVENOUS | @ 08:00:00

## 2024-05-13 MED ADMIN — potassium phosphate 30 mmol in sodium chloride (NS) 0.9 % 500 mL infusion: 30 mmol | INTRAVENOUS | @ 13:00:00 | Stop: 2024-05-13

## 2024-05-13 MED ADMIN — multivitamins, therapeutic with minerals tablet 1 tablet: 1 | GASTROENTERAL | @ 12:00:00

## 2024-05-13 MED ADMIN — esomeprazole (NEXIUM) granules 40 mg: 40 mg | GASTROENTERAL | @ 01:00:00

## 2024-05-13 MED ADMIN — lactulose oral solution: 20 g | GASTROENTERAL | @ 01:00:00

## 2024-05-13 MED ADMIN — ursodiol (ACTIGALL) oral suspension: 300 mg | GASTROENTERAL | @ 22:00:00

## 2024-05-13 MED ADMIN — esomeprazole (NEXIUM) granules 40 mg: 40 mg | GASTROENTERAL | @ 12:00:00

## 2024-05-13 MED ADMIN — levETIRAcetam (KEPPRA) oral solution: 750 mg | GASTROENTERAL | @ 01:00:00

## 2024-05-13 NOTE — Unmapped (Signed)
 Patient disoriented to time, place, and situation. Levo titrated for a systolic > 100. Remains on RA. Mitts on for most of the night. PRN melatonin and atarax given. Afebrile. NSR on tele. Foley with adequate output. FMS with 225 output + 1 occurrence. Redressed central line. Tpa instilled into proximal lumen of CVC. Continued tube feeds. Blood sugars elevated.     Problem: Adult Inpatient Plan of Care  Goal: Plan of Care Review  Outcome: Ongoing - Unchanged  Goal: Patient-Specific Goal (Individualized)  Outcome: Ongoing - Unchanged  Goal: Absence of Hospital-Acquired Illness or Injury  Outcome: Ongoing - Unchanged  Intervention: Prevent Skin Injury  Recent Flowsheet Documentation  Taken 05/13/2024 0600 by Archer Kobs, RN  Positioning for Skin: Left  Taken 05/13/2024 0400 by Archer Kobs, RN  Positioning for Skin: Right  Taken 05/13/2024 0200 by Archer Kobs, RN  Positioning for Skin: Left  Taken 05/13/2024 0000 by Archer Kobs, RN  Positioning for Skin: Right  Taken 05/12/2024 2200 by Archer Kobs, RN  Positioning for Skin: Left  Taken 05/12/2024 2000 by Archer Kobs, RN  Positioning for Skin: Right  Device Skin Pressure Protection:   absorbent pad utilized/changed   adhesive use limited   positioning supports utilized   pressure points protected  Skin Protection:   adhesive use limited   incontinence pads utilized   skin sealant/moisture barrier applied   tubing/devices free from skin contact  Intervention: Prevent Infection  Recent Flowsheet Documentation  Taken 05/12/2024 2000 by Archer Kobs, RN  Infection Prevention:   cohorting utilized   environmental surveillance performed   equipment surfaces disinfected   hand hygiene promoted   personal protective equipment utilized   rest/sleep promoted   single patient room provided  Goal: Optimal Comfort and Wellbeing  Outcome: Ongoing - Unchanged  Goal: Readiness for Transition of Care  Outcome: Ongoing - Unchanged  Goal: Rounds/Family Conference  Outcome: Ongoing - Unchanged     Problem: Skin Injury Risk Increased  Goal: Skin Health and Integrity  Outcome: Ongoing - Unchanged  Intervention: Optimize Skin Protection  Recent Flowsheet Documentation  Taken 05/13/2024 0400 by Archer Kobs, RN  Activity Management: bedrest  Taken 05/13/2024 0000 by Archer Kobs, RN  Activity Management: bedrest  Taken 05/12/2024 2000 by Archer Kobs, RN  Activity Management: bedrest  Pressure Reduction Techniques:   frequent weight shift encouraged   heels elevated off bed   positioned off wounds   pressure points protected   weight shift assistance provided  Head of Bed (HOB) Positioning: HOB at 30-45 degrees  Pressure Reduction Devices:   positioning supports utilized   pressure-redistributing mattress utilized   heel offloading device utilized   specialty bed utilized  Skin Protection:   adhesive use limited   incontinence pads utilized   skin sealant/moisture barrier applied   tubing/devices free from skin contact     Problem: Fall Injury Risk  Goal: Absence of Fall and Fall-Related Injury  Outcome: Ongoing - Unchanged     Problem: Non-Violent Restraints  Goal: Patient will remain free of restraint events  Outcome: Ongoing - Unchanged  Goal: Patient will remain free of physical injury  Outcome: Ongoing - Unchanged     Problem: Wound  Goal: Optimal Coping  Outcome: Ongoing - Unchanged  Goal: Optimal Functional Ability  Outcome: Ongoing - Unchanged  Intervention: Optimize Functional Ability  Recent Flowsheet Documentation  Taken 05/13/2024 0400 by Archer Kobs, RN  Activity Management: bedrest  Taken 05/13/2024 0000 by Archer Kobs, RN  Activity Management: bedrest  Taken 05/12/2024 2000 by Archer Kobs, RN  Activity Management: bedrest  Goal: Absence of Infection Signs and Symptoms  Outcome: Ongoing - Unchanged  Intervention: Prevent or Manage Infection  Recent Flowsheet Documentation  Taken 05/12/2024 2000 by Archer Kobs, RN  Infection Management: aseptic technique maintained  Goal: Improved Oral Intake  Outcome: Ongoing - Unchanged  Goal: Optimal Pain Control and Function  Outcome: Ongoing - Unchanged  Goal: Skin Health and Integrity  Outcome: Ongoing - Unchanged  Intervention: Optimize Skin Protection  Recent Flowsheet Documentation  Taken 05/13/2024 0400 by Archer Kobs, RN  Activity Management: bedrest  Taken 05/13/2024 0000 by Archer Kobs, RN  Activity Management: bedrest  Taken 05/12/2024 2000 by Archer Kobs, RN  Activity Management: bedrest  Pressure Reduction Techniques:   frequent weight shift encouraged   heels elevated off bed   positioned off wounds   pressure points protected   weight shift assistance provided  Head of Bed (HOB) Positioning: HOB at 30-45 degrees  Pressure Reduction Devices:   positioning supports utilized   pressure-redistributing mattress utilized   heel offloading device utilized   specialty bed utilized  Skin Protection:   adhesive use limited   incontinence pads utilized   skin sealant/moisture barrier applied   tubing/devices free from skin contact  Goal: Optimal Wound Healing  Outcome: Ongoing - Unchanged     Problem: Self-Care Deficit  Goal: Improved Ability to Complete Activities of Daily Living  Outcome: Ongoing - Unchanged     Problem: Mechanical Ventilation Invasive  Goal: Effective Communication  Outcome: Ongoing - Unchanged  Goal: Optimal Device Function  Outcome: Ongoing - Unchanged  Intervention: Optimize Device Care and Function  Recent Flowsheet Documentation  Taken 05/13/2024 0400 by Archer Kobs, RN  Oral Care:   lip/mouth moisturizer applied   mouth swabbed   oral rinse provided   suction provided   teeth brushed   tongue brushed  Taken 05/13/2024 0000 by Archer Kobs, RN  Oral Care:   lip/mouth moisturizer applied   mouth swabbed   suction provided  Taken 05/12/2024 2000 by Archer Kobs, RN  Oral Care:   mouth swabbed   suction provided   lip/mouth moisturizer applied  Goal: Mechanical Ventilation Liberation  Outcome: Ongoing - Unchanged  Goal: Optimal Nutrition Delivery  Outcome: Ongoing - Unchanged  Goal: Absence of Device-Related Skin and Tissue Injury  Outcome: Ongoing - Unchanged  Intervention: Maintain Skin and Tissue Health  Recent Flowsheet Documentation  Taken 05/12/2024 2000 by Archer Kobs, RN  Device Skin Pressure Protection:   absorbent pad utilized/changed   adhesive use limited   positioning supports utilized   pressure points protected  Goal: Absence of Ventilator-Induced Lung Injury  Outcome: Ongoing - Unchanged  Intervention: Prevent Ventilator-Associated Pneumonia  Recent Flowsheet Documentation  Taken 05/13/2024 0400 by Archer Kobs, RN  Oral Care:   lip/mouth moisturizer applied   mouth swabbed   oral rinse provided   suction provided   teeth brushed   tongue brushed  Taken 05/13/2024 0000 by Archer Kobs, RN  Oral Care:   lip/mouth moisturizer applied   mouth swabbed   suction provided  Taken 05/12/2024 2000 by Archer Kobs, RN  Head of Bed [facility]) Positioning: HOB at 30-45 degrees  Oral Care:   mouth swabbed   suction provided   lip/mouth moisturizer applied     Problem: Artificial Airway  Goal: Effective Communication  Outcome: Ongoing - Unchanged  Goal: Optimal Device Function  Outcome: Ongoing - Unchanged  Intervention: Optimize Device Care and Function  Recent Flowsheet Documentation  Taken 05/13/2024 0400 by Archer Kobs, RN  Oral Care:   lip/mouth moisturizer applied   mouth swabbed   oral rinse provided   suction provided   teeth brushed   tongue brushed  Taken 05/13/2024 0000 by Archer Kobs, RN  Oral Care:   lip/mouth moisturizer applied   mouth swabbed   suction provided  Taken 05/12/2024 2000 by Archer Kobs, RN  Aspiration Precautions:   medication route adjusted   oral hygiene care promoted   respiratory status monitored upright posture maintained   tube feeding placement verified  Oral Care:   mouth swabbed   suction provided   lip/mouth moisturizer applied  Goal: Absence of Device-Related Skin or Tissue Injury  Outcome: Ongoing - Unchanged  Intervention: Maintain Skin and Tissue Health  Recent Flowsheet Documentation  Taken 05/12/2024 2000 by Archer Kobs, RN  Device Skin Pressure Protection:   absorbent pad utilized/changed   adhesive use limited   positioning supports utilized   pressure points protected

## 2024-05-13 NOTE — Unmapped (Signed)
 MICU Daily Progress Note     Date of Service: 05/13/2024    Problem List:   Principal Problem:    Shock     Active Problems:    Type 2 diabetes mellitus, with long-term current use of insulin        Hyperlipidemia    Hypertension    Coronary artery disease involving native heart without angina pectoris    Diabetes mellitus       Esophageal varices in cirrhosis       Hepatic encephalopathy       Calcified cerebral meningioma       Portal hypertension       Other chronic pain    OSA (obstructive sleep apnea)    Stage 3a chronic kidney disease (CMS-HCC)    Hematemesis      Interval history: Ashley Briggs is a 58 y.o. female with PMH CAD s/p NSTEMI, CKD, NAS cirrhosis c/b ascites, hepatic encephalopathy and EV s/p recent banding admitted with UGI bleed 2/2 bleeding esophageal ulcers s/p unsusccessful TIPS c/b pulsatile VT s/p shock requiring transfer to [facility] ICU. Course complicated by severe hemorrhagic shock 2/2 gastric bleed now s/p TIPS with course complicated by acute hypoxic respiratory failure, AKI, stress cardiomyopathy and hepatic encephalopathy. Improving pressor requirement, Hgb stable. Extubated 6/12 and weaned to RA.    24hr events:   - Hypernatremic, increased FWF to 200cc q2h  - Increased to NPH 60 units BID, regular 15 units QID, regular SSI 15 units QID  - Increased midodrine  to 10 units TID  - Deescalate from triple lumen to PICC  - Pruritus and rising bili, added scheduled ursodiol     Neurological   Sedation (resolved)  Sedation held and extubated 6/12. Now A&O.     History of seizure-like activity (stable)  - Home Keppra  750mg  BID    Chronic pain  History of chronic pain, treated with baclofen , naltrexone , oxycodone .   - Hold home baclofen , naltrextone, oxycodone  in the setting of hypotension and sedation    Major depressive disorder (stable)  - Hold home bupoprion, citalopram     Insomnia (stable)  - Hold home zolpidem     PMH calcified cerebral meningioma (stable)  - no intervention at this time    Analgesia: pain adequately controlled    Pulmonary   Intubation and mechanical ventilation (resolved)  Previously intubated for procedures and altered mental status. Extubated 6/5, re-intubated later the same day for worsening hypoxia. Successful extubated 6/12 and now weaned to RA.     Pulmonary edema (stable)  Increasing hypoxia on 6/5, ultimately requiring re-intubation with FiO2 100%. CXR demonstrated moderate pulmonary edema. Etiology of pulmonary edema most likely cardiogenic given concurrent suspected stress cardiomyopathy, though she is also at risk for ARDS and noncardiogenic pulmonary edema cannot be excluded. Diuresed with improving oxygen requirement, now on room air.   - CTM    Left pleural effusion (stable)  CXR 6/4 remarkable for moderate Left pleural effusion with passive atelectasis. S/p thoracentesis on 6/7. Pleural fluid c/w transudative effusion by protein parameters.   - CTM    Cardiovascular   Undifferentiated shock, hemorrhagic versus septic  Arrived to [facility] MICU in undifferentiated shock requiring 2 pressors. Acutely worsening hypotension on 6/4 requiring fourth pressor, aggressive fluid resuscitation, and massive transfusion protocol. S/p emergent TIPS on 6/4. Isolated fever on 6/6, prerenal AKI, and central venous O2 nearly 90 concerning for some component of septic shock, though infectious workup remains unremarkable, and central venous O2 may be confounded in the  setting of cirrhosis. Intermittently with increased pressor requirements (norepi 14), will increase midodrine  with cautious hemodynamic monitoring iso reduced EF. Would consider RUQ U/S versus further infectious workup should pressor requirement remain increased.  - Norepi, wean for SBP goal > 100  - Increase midodrine  to 10mg  TID    C/f stress cardiomyopathy - Demand Ischemia - Type II NSTEMI  With elevated troponin on admission (5.4k) likely 2/2 demand ischemia in the setting of shock. Echo on admission overall normal with EF > 55%. Developed several episodes of wide complex tachycardia on 6/5. Bedside POCUS with apical ballooning and slightly decreased EF to 45-50%. EKG was without acute ischemic changes. Troponin peaked at 23k (6/6). Formal TTE demonstrated reduced EF to 45% with basal and midventricular hypokinesis. Findings favored to represent stress cardiomyopathy rather than ACS in the absence of acute EKG changes. Repeat TTE 6/10 with EF 40%, marginally decreased from prior.   - Cardiology consulted, appreciate recs   - Low concern for true ACS, no need for heparinization or ischemic eval acutely    Wide complex tachycardia - Hx ventricular tachycardia  Developed VT arrest requiring shock at OSH with initial TIPS attempt. On 6/5 developed several episodes of wide complex tachycardia. EKG with tachycardia to 130s, thought by cardiology to represent a fib with abberancy rather than VT. She was stabilized with amiodarone  bolus, followed by amiodarone  gtt and then PO amio. Finished amio load 6/12, no role for amio moving forward as this was precipitated iso critical illness.   - Cardiology consulted, appreciate recs   - Off amiodarone  gtt   - S/p po amiodarone  load  - q8h BMP  - Replete K>4, Mg>2    CAD, prior NSTEMI  Prior NSTEMI per chart review. No LHC data available. Unclear if she has history of coronary stent.   - Home aspirin  held ISO bleeding    Renal   Prerenal AKI, c/f HRS (resolved)  Cr elevated to 1.32 from baseline ~0.8. Urine lytes most consistent with prerenal etiology, 2/2 hypoperfusion iso shock, unlikely HRS. Cr slowly improving, now with increasing urine output.   - Management of contributing etiologies as above  - q8h BMP     Infectious Disease/Autoimmune   Undifferentiated shock, c/f septic shock - Fever (resolved) - Leukocytosis (resolved)  Presented to [facility] MICU in 2-pressor shock with leukocytosis to 24.9. UA noninfectious, CXR without pneumonia. Bcx NGTD x 24 hours. Most likely infectious source intra-abdominal. SBP considered, unfortunately ascites from paracentesis on 6/4 were not sent for diagnostic studies. No skin findings on exam to suggest SSTI. Femoral line placed at OSH possibly an infectious source, though Bcx have been NGTD. S/p vanc/zosyn . S/p tobramycin  x 1. With fever to 38.4 on 6/6. Continues now on vanc/zosyn . Paracentesis with PMNs < 250. Thoracentesis transudative. UA noninfectious. Lrcx gram stain with 1+ yeast, 1+ GPC. S/p 6 days of broad spectrum antibiotic coverage thorough workup unremarkable for underlying infectious etiology. Fluctuating single pressor requirement since 6/9, repeat infectious workup negative thus far; low clinical concern for infection at this time.   - Bcx ngtd  - Ucx 6/4 negative  - Paracentesis 6/7 with ANC < 250, thoracentesis transudative  - Lrcx growing oropharyngeal flora  - Deferring further antibiotics iso low concern for infection    Cultures:  Blood Culture, Routine (no units)   Date Value   05/06/2024 No Growth at 5 days   05/06/2024 No Growth at 5 days     Urine Culture, Comprehensive (no units)   Date  Value   05/04/2024 NO GROWTH     Lower Respiratory Culture (no units)   Date Value   05/07/2024 OROPHARYNGEAL FLORA ISOLATED   05/06/2024 Specimen Not Processed     WBC (10*9/L)   Date Value   05/13/2024 15.1 (H)     WBC, UA (/HPF)   Date Value   05/10/2024 25 (H)          FEN/GI   Decompensated cirrhosis c/b esophageal varices, gastric varix, hepatic encephalopathy, ascites   Presenting with hematemesis 6 days after esophageal banding. Emergent TIPS at OSH not successful d/t inability to access the portal vein. EGD on 6/4 demonstrated nonbleeding esophageal ulcers at site of prior banding, stomach completely occluded by blood which could not be cleared endoscopically. With acute decline in Hb and hypotension on 6/4. Darius Edouard attempted with GI, but unsuccessful after numerous attempts. S/p salvage TIPS on 6/4. Ongoing melena and dark red NGT output with stable Hb felt to represent prior bleeding. Low clinical concern for active GI bleeding.   - S/p salvage TIPS  - Hepatology following  - Daily MELLD labs (CMP, PT/INR)  - Maintain fibrinogen > 100 with cryo  - Home lasix , spironolactone  held d/t hypotension  - Continue rifaximin , continue lactulose  20g BID   - Will remove FMS, titrate lactulose  to 3-5 Bms daily    Acute on chronic liver failure - Ischemic hepatitis (improving)  LFTs continue to rise s/p variceal hemorrhage and TIPS. Unfortunately, precluded from transplant eval with newly reduced EF. LFTs slowly improving, suggesting ongoing recovery from TIPS and hemorrhagic event.  - Hepatology following    Malnutrition Assessment:   Tube feeds started 6/9  Tube feeds: adjusting given pt extubated   Recommend Nutren 2.0 at goal rate 40 mL/hr. This provides 1680 kcals, 71 g protein, 182 g carbohydrate, 78 g fat, 0 g fiber, 580 mL free water , and meets 112% USRDI.  Prosource no carb 1 pkt daily (60 kcal, 15 g protein)   Total nutrition: 1740 kcal, 86 g protein     Post-extubation speech eval with recommendation for NPO, ice chips only       Heme/Coag   Blood loss anemia, likely 2/2 refractory portal hypertensive bleeding (stable)  S/p 3 units pRBCs at OSH, Hb 9.3 on arrival to MICU. S/p massive transfusion protocol on 6/4. Low clinical concern for ongoing blood loss with stable Hb, last transfusion 6/4.   - q8h CBC with diff    Mild Thrombocytopenia (stable)  Plts 253k on admission, acute decline to 50k on 6/5. Currently without signs of active bleeding. Plts remain stable around ~100k.  - q8h CBC with diff    Endocrine   Insulin -dependent T2DM  A1c 5.6 on admission. Home regimen lantus  60, lispro with meals. BG control challenging in the setting of critical illness and stress dose steroids. S/p insulin  infusion, converted to NPH 6/8. Ongoing hyperglycemia in the setting of continuous tube feeds, titrating insulin .   - NPH 60 units BID  - Regular insulin  15 units QID  - Regular SSI QID    Integumentary   Psoriasis (stable)  History of psoriasis per chart review. With numerous chronic appearing scattered erythematous plaques with silvery scale. Of note, on Secukinumab  for psoriasis management, which can increase risk for infection.    # Irritant contact dermatitis (buttocks)  # Wound, R upper anterior leg  # Irritant contact dermatitis (anterior pelvis)  # Intertrigo along pannus  # Scalp wound 2/2 psoriasis  - WOCN consulted for high  risk skin assessment Yes.  - Recommendations per WOCN note:  WOCN Recommendations:   - See nursing orders for wound care instructions.  - Contact WOCN with questions, concerns, or wound deterioration.  Topical Therapy/Interventions:   - Absorbent Dressing  - Antimicrobial Solutions  - Crusting (stoma powder or antifungal powder)  - Hydrofiber  - Zinc oxide barrier cream      Prophylaxis/LDA/Restraints/Consults   ICU checklist completed: yes (see ICU rounding navigator in Epic)    Patient Lines/Drains/Airways Status       Active Active Lines, Drains, & Airways       Name Placement date Placement time Site Days    CVC Triple Lumen 05/05/24 Non-tunneled Left Internal jugular 05/05/24  1740  Internal jugular  7    NG/OG Tube Right nostril 05/06/24  1930  Right nostril  6    Urethral Catheter 05/04/24  0500  --  9    Peripheral IV 05/04/24 Anterior;Left;Upper Arm 05/04/24  0500  Arm  9    Peripheral IV 05/04/24 Anterior;Right;Upper Arm 05/04/24  0500  Arm  9    Peripheral IV 05/06/24 Right Arm 05/06/24  1716  Arm  6    Arterial Line 05/10/24 Right Radial 05/10/24  1845  Radial  2                  Patient Lines/Drains/Airways Status       Active Wounds       Name Placement date Placement time Site Days    Surgical Site 05/04/24 Abdomen Lower;Right 05/04/24  2300  -- 8    Surgical Site 05/04/24 Neck Right 05/04/24  2300  -- 8    Surgical Site 05/04/24 Groin Right 05/04/24  2300  -- 8    Wound 05/12/24 Irritant Contact Dermatitis Rash/Dermatitis Buttocks 05/12/24  1220  Buttocks  less than 1    Wound 05/12/24 Other (Comment) Leg Anterior;Right;Upper 05/12/24  1225  Leg  less than 1    Wound 05/12/24 Irritant Contact Dermatitis Perspiration Pelvis Anterior;Right Intertrigo along pannus 05/12/24  1226  Pelvis  less than 1    Wound 05/12/24 Other (Comment) Head Posterior;Upper 05/12/24  1227  Head  less than 1                    Goals of Care     Code Status: Full Code    Designated Healthcare Decision Maker:  Ms. Desrocher current decisional capacity for healthcare decision-making is Full capacity. Her designated healthcare decision maker(s) is/are   HCDM (patient stated preference): Caudle,Ryan - Relative - [phone].      Subjective     Patient lying in bed this morning. Alert and oriented to self, year, and in part to situation. Disoriented to place and date. Hoarse voice improving. Continuing to ask to eat and drink, speech eval yesterday with recommendation for NPO/ice chips only. Denies abdominal pain.     Objective     Vitals - past 24 hours  Temp:  [36.8 ??C (98.2 ??F)-37.3 ??C (99.2 ??F)] 36.8 ??C (98.2 ??F)  Pulse:  [84-100] 99  SpO2 Pulse:  [84-100] 100  Resp:  [9-19] 14  BP: (94-103)/(25-32) 94/32  SpO2:  [91 %-99 %] 95 % Intake/Output  I/O last 3 completed shifts:  In: 3598.8 [P.O.:120; I.V.:468.8; NG/GT:3010]  Out: 2460 [Urine:2010; Stool:450]     Physical Exam:    General: Ill-appearing female, lying in bed.   HEENT: Mucous membranes dry. Sclerae icteric.   CV: RRR  Pulm: CTAB on anterior exam.  GI: Normoactive bowel sounds. Soft, nondistended. RUQ and epigastrium nontender to deep palpation.  Extremities: Trace edema BLE.   Skin: Diffuse scattered erythematous plaques with silvery scale. No bruising, petechiae, mucocutaneous bleeding.  Neuro: Alert and oriented to self and in part to situation, disoriented to time and place.     Continuous Infusions:    NORepinephrine  bitartrate-NS 10 mcg/min (05/13/24 0427)       Scheduled Medications: enoxaparin  (LOVENOX ) injection  40 mg Subcutaneous Q24H    esomeprazole   40 mg Enteral tube: gastric BID    insulin  NPH  50 Units Subcutaneous Q12H    insulin  regular  0-20 Units Subcutaneous Q6H SCH    insulin  regular  10 Units Subcutaneous Q6H    lactulose   20 g Enteral tube: gastric BID    levETIRAcetam   750 mg Enteral tube: gastric BID    midodrine   5 mg Oral TID    multivitamins (ADULT)  1 tablet Enteral tube: gastric Daily    potassium phosphate   21 mmol Intravenous Once    sodium chloride   10 mL Intravenous Q8H    sodium chloride   10 mL Intravenous Q8H    sodium chloride   10 mL Intravenous Q8H    ursodiol  300 mg Enteral tube: gastric 2xd PC       PRN medications:  [COMPLETED] alteplase  **AND** alteplase , dextrose  in water , glucagon, glucose, hydrOXYzine, melatonin    Data/Imaging Review: Reviewed in Epic and personally interpreted on 05/13/2024. See EMR for detailed results.    Vallery Gavel, MS4    I attest that I have reviewed the medical student note and that the components of the history of the present illness, the physical exam, and the assessment and plan documented were performed by me or were performed in my presence by the student where I verified the documentation and performed (or re-performed) the exam and medical decision making. Rexine Cater, MD

## 2024-05-13 NOTE — Unmapped (Signed)
 A/Ox2. Able to state year, but unclear on month. Able to state where she is but unclear on city. Mentation improving throughout day with delayed responses. CAM positive. RASS 0/-1. Took ice chips well and by late afternoon has been able to drink thin liquids with no aspiration noted. MD notified for swallow eval follow up. NG remains in place with TF at goal and q2h water  flushes for sodium 151.  Afebrile. On RA. Off pressors at 1100 and has remained off throughout shift with systolic goal >100 per order. No c/o pain. FMS removed this morning with large BM this shift. Foley replaced this morning. Wound care done. Q2h turns. Family at bedside. Will continue to monitor.     Problem: Adult Inpatient Plan of Care  Goal: Plan of Care Review  Outcome: Progressing  Goal: Patient-Specific Goal (Individualized)  Outcome: Progressing  Goal: Absence of Hospital-Acquired Illness or Injury  Outcome: Progressing  Intervention: Identify and Manage Fall Risk  Recent Flowsheet Documentation  Taken 05/13/2024 0800 by Annamarie Kid, RN  Safety Interventions:   aspiration precautions   bleeding precautions   fall reduction program maintained  Intervention: Prevent Skin Injury  Recent Flowsheet Documentation  Taken 05/13/2024 1600 by Annamarie Kid, RN  Positioning for Skin: Right  Taken 05/13/2024 1400 by Annamarie Kid, RN  Positioning for Skin: Left  Taken 05/13/2024 1200 by Annamarie Kid, RN  Positioning for Skin: Right  Taken 05/13/2024 1000 by Annamarie Kid, RN  Positioning for Skin: Left  Taken 05/13/2024 0800 by Annamarie Kid, RN  Positioning for Skin: Right  Intervention: Prevent and Manage VTE (Venous Thromboembolism) Risk  Recent Flowsheet Documentation  Taken 05/13/2024 1600 by Annamarie Kid, RN  Anti-Embolism Device Status: (lovenox ) Other (Comment)  Taken 05/13/2024 1400 by Annamarie Kid, RN  Anti-Embolism Device Status: (lovenox ) Other (Comment)  Taken 05/13/2024 1200 by Annamarie Kid, RN  Anti-Embolism Device Status: (lovenox ) Other (Comment)  Taken 05/13/2024 1000 by Annamarie Kid, RN  Anti-Embolism Device Status: (lovenox ) Other (Comment)  Taken 05/13/2024 0800 by Annamarie Kid, RN  Anti-Embolism Device Status: (lovenox ) Other (Comment)  Goal: Optimal Comfort and Wellbeing  Outcome: Progressing  Goal: Readiness for Transition of Care  Outcome: Progressing  Goal: Rounds/Family Conference  Outcome: Progressing     Problem: Skin Injury Risk Increased  Goal: Skin Health and Integrity  Outcome: Progressing  Intervention: Optimize Skin Protection  Recent Flowsheet Documentation  Taken 05/13/2024 0800 by Annamarie Kid, RN  Pressure Reduction Techniques:   heels elevated off bed   weight shift assistance provided  Head of Bed (HOB) Positioning: HOB at 30-45 degrees     Problem: Fall Injury Risk  Goal: Absence of Fall and Fall-Related Injury  Outcome: Progressing  Intervention: Promote Scientist, clinical (histocompatibility and immunogenetics) Documentation  Taken 05/13/2024 0800 by Annamarie Kid, RN  Safety Interventions:   aspiration precautions   bleeding precautions   fall reduction program maintained     Problem: Non-Violent Restraints  Goal: Patient will remain free of restraint events  Outcome: Progressing  Goal: Patient will remain free of physical injury  Outcome: Progressing     Problem: Wound  Goal: Optimal Coping  Outcome: Progressing  Goal: Optimal Functional Ability  Outcome: Progressing  Goal: Absence of Infection Signs and Symptoms  Outcome: Progressing  Goal: Improved Oral Intake  Outcome: Progressing  Goal: Optimal Pain Control and Function  Outcome: Progressing  Goal: Skin Health and Integrity  Outcome: Progressing  Intervention: Optimize Skin Protection  Recent Flowsheet Documentation  Taken 05/13/2024 0800 by Annamarie Kid, RN  Pressure Reduction Techniques:   heels elevated off bed   weight shift assistance provided  Head of Bed (HOB) Positioning: HOB at 30-45 degrees  Goal: Optimal Wound Healing  Outcome: Progressing     Problem: Self-Care Deficit  Goal: Improved Ability to Complete Activities of Daily Living  Outcome: Progressing     Problem: Mechanical Ventilation Invasive  Goal: Effective Communication  Outcome: Progressing  Goal: Optimal Device Function  Outcome: Progressing  Intervention: Optimize Device Care and Function  Recent Flowsheet Documentation  Taken 05/13/2024 1723 by Annamarie Kid, RN  Oral Care:   mouth swabbed   teeth brushed   tongue brushed  Taken 05/13/2024 0800 by Annamarie Kid, RN  Oral Care:   mouth swabbed   teeth brushed   tongue brushed   lip/mouth moisturizer applied  Goal: Mechanical Ventilation Liberation  Outcome: Progressing  Goal: Optimal Nutrition Delivery  Outcome: Progressing  Goal: Absence of Device-Related Skin and Tissue Injury  Outcome: Progressing  Goal: Absence of Ventilator-Induced Lung Injury  Outcome: Progressing  Intervention: Prevent Ventilator-Associated Pneumonia  Recent Flowsheet Documentation  Taken 05/13/2024 1723 by Annamarie Kid, RN  Oral Care:   mouth swabbed   teeth brushed   tongue brushed  Taken 05/13/2024 0800 by Annamarie Kid, RN  Head of Bed [facility]) Positioning: HOB at 30-45 degrees  Oral Care:   mouth swabbed   teeth brushed   tongue brushed   lip/mouth moisturizer applied     Problem: Artificial Airway  Goal: Effective Communication  Outcome: Progressing  Goal: Optimal Device Function  Outcome: Progressing  Intervention: Optimize Device Care and Function  Recent Flowsheet Documentation  Taken 05/13/2024 1723 by Annamarie Kid, RN  Oral Care:   mouth swabbed   teeth brushed   tongue brushed  Taken 05/13/2024 0800 by Annamarie Kid, RN  Aspiration Precautions:   awake/alert before oral intake   respiratory status monitored   oral hygiene care promoted   tube feeding placement verified  Oral Care:   mouth swabbed   teeth brushed   tongue brushed   lip/mouth moisturizer applied  Goal: Absence of Device-Related Skin or Tissue Injury  Outcome: Progressing

## 2024-05-13 NOTE — Unmapped (Signed)
 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 The patient is seen in consultation at the request of Derry Flock, MD (Medical ICU ([facility])) for decompensated cirrhosis with variceal bleeding.    Patient with significant UGIB likely 2/2 gastric varix that was unable to be controlled endoscopically. Patient underwent successful salvage TIPS and has stopped bleeding. Unfortunately, she was re-intubated (likely iso worsening HE), now extubated and improving. Suspect she will need several days to settle out, focus on mobilization and nutrition. Ongoing leukocytosis and now back on pressors, always need to have low threshold to eval for infection in patients with cirrhosis.    Recommendations:  - In setting of persistent leukocytosis, re-send infectious workup: BCX, UA/UCX, CXR, dx para if tappable pocket  - Daily MELD labs  - PT/OT  - Cont TF  - Cont PO PPI BID (home med)  - Continue lactulose  for 3-5 bowel movements daily and rifaximin      MELD 3.0: 24 at 05/13/2024  4:19 AM  MELD-Na: 20 at 05/13/2024  4:19 AM  Calculated from:  Serum Creatinine: 0.78 mg/dL (Using min of 1 mg/dL) at 1/47/8295  6:21 AM  Serum Sodium: 151 mmol/L (Using max of 137 mmol/L) at 05/13/2024  4:19 AM  Total Bilirubin: 15.2 mg/dL at 02/05/6577  4:69 AM  Serum Albumin : 2.6 g/dL at 05/30/5283  1:32 AM  INR(ratio): 1.34 at 05/13/2024  4:19 AM  Age at listing (hypothetical): 57 years  Sex: Female at 05/13/2024  4:19 AM      Issues Impacting Complexity of Management:  -The patient has the need for intensive monitoring parameter(s) due to high-risk of clinical decline: q4h or more frequent monitoring of hemoglobins to monitor for stability of GI bleeding and frequent monitoring of MELD score to monitor for progression to/progression of acute or acute on chronic liver failure    Recommendations discussed with the patient's primary team. We will continue to follow along with you.    Subjective:   - Low level pressor requirement- ? Because midodrine  stopped  - Ongoing leukocytosis, but no fevers  - Hgb stable, LFTs stable  - Clinically patient looks much better, and feels much better    Objective:   Temp:  [36.8 ??C (98.2 ??F)-37.3 ??C (99.2 ??F)] 36.8 ??C (98.2 ??F)  Pulse:  [84-100] 94  SpO2 Pulse:  [84-100] 94  Resp:  [9-22] 15  BP: (94-103)/(25-32) 94/32  SpO2:  [91 %-99 %] 95 %    Gen: Ill appearing  HEENT: intubated   Abdomen:  soft, moderately distended     Pertinent Labs & Studies:  -I have reviewed the patient's labs from 05/13/24 which show stable Hgb, worsening LFTs, and stable INR

## 2024-05-14 LAB — COMPREHENSIVE METABOLIC PANEL
ALBUMIN: 2.3 g/dL — ABNORMAL LOW (ref 3.4–5.0)
ALBUMIN: 2.2 g/dL — ABNORMAL LOW (ref 3.4–5.0)
ALBUMIN: 2.2 g/dL — ABNORMAL LOW (ref 3.4–5.0)
ALKALINE PHOSPHATASE: 108 U/L (ref 46–116)
ALKALINE PHOSPHATASE: 109 U/L (ref 46–116)
ALKALINE PHOSPHATASE: 105 U/L (ref 46–116)
ALT (SGPT): 113 U/L — ABNORMAL HIGH (ref 10–49)
ALT (SGPT): 101 U/L — ABNORMAL HIGH (ref 10–49)
ALT (SGPT): 94 U/L — ABNORMAL HIGH (ref 10–49)
ANION GAP: 8 mmol/L (ref 5–14)
ANION GAP: 10 mmol/L (ref 5–14)
ANION GAP: 8 mmol/L (ref 5–14)
AST (SGOT): 72 U/L — ABNORMAL HIGH (ref <=34)
AST (SGOT): 72 U/L — ABNORMAL HIGH (ref <=34)
AST (SGOT): 73 U/L — ABNORMAL HIGH (ref <=34)
BILIRUBIN TOTAL: 13.6 mg/dL — ABNORMAL HIGH (ref 0.3–1.2)
BILIRUBIN TOTAL: 13.6 mg/dL — ABNORMAL HIGH (ref 0.3–1.2)
BILIRUBIN TOTAL: 14.3 mg/dL — ABNORMAL HIGH (ref 0.3–1.2)
BLOOD UREA NITROGEN: 52 mg/dL — ABNORMAL HIGH (ref 9–23)
BLOOD UREA NITROGEN: 47 mg/dL — ABNORMAL HIGH (ref 9–23)
BLOOD UREA NITROGEN: 46 mg/dL — ABNORMAL HIGH (ref 9–23)
BUN / CREAT RATIO: 68
BUN / CREAT RATIO: 66
BUN / CREAT RATIO: 63
CALCIUM: 9.1 mg/dL (ref 8.7–10.4)
CALCIUM: 8.9 mg/dL (ref 8.7–10.4)
CALCIUM: 8.7 mg/dL (ref 8.7–10.4)
CHLORIDE: 112 mmol/L — ABNORMAL HIGH (ref 98–107)
CHLORIDE: 110 mmol/L — ABNORMAL HIGH (ref 98–107)
CHLORIDE: 106 mmol/L (ref 98–107)
CO2: 26 mmol/L (ref 20.0–31.0)
CO2: 23 mmol/L (ref 20.0–31.0)
CO2: 23 mmol/L (ref 20.0–31.0)
CREATININE: 0.77 mg/dL (ref 0.55–1.02)
CREATININE: 0.71 mg/dL (ref 0.55–1.02)
CREATININE: 0.73 mg/dL (ref 0.55–1.02)
EGFR CKD-EPI (2021) FEMALE: 89 mL/min/1.73m2 (ref >=60)
EGFR CKD-EPI (2021) FEMALE: >90 mL/min/1.73m2 (ref >=60)
EGFR CKD-EPI (2021) FEMALE: >90 mL/min/1.73m2 (ref >=60)
GLUCOSE RANDOM: 272 mg/dL — ABNORMAL HIGH (ref 70–179)
GLUCOSE RANDOM: 225 mg/dL — ABNORMAL HIGH (ref 70–179)
GLUCOSE RANDOM: 259 mg/dL — ABNORMAL HIGH (ref 70–179)
POTASSIUM: 4 mmol/L (ref 3.4–4.8)
POTASSIUM: 3.9 mmol/L (ref 3.4–4.8)
POTASSIUM: 4.5 mmol/L (ref 3.4–4.8)
PROTEIN TOTAL: 5.9 g/dL (ref 5.7–8.2)
PROTEIN TOTAL: 5.7 g/dL (ref 5.7–8.2)
PROTEIN TOTAL: 5.8 g/dL (ref 5.7–8.2)
SODIUM: 146 mmol/L — ABNORMAL HIGH (ref 135–145)
SODIUM: 143 mmol/L (ref 135–145)
SODIUM: 137 mmol/L (ref 135–145)

## 2024-05-14 LAB — PROTIME-INR
INR: 1.42
INR: 1.49
PROTIME: 16.2 s — ABNORMAL HIGH (ref 9.9–12.6)
PROTIME: 17 s — ABNORMAL HIGH (ref 9.9–12.6)

## 2024-05-14 LAB — BLOOD GAS CRITICAL CARE PANEL, ARTERIAL
BASE EXCESS ARTERIAL: 1.4 (ref -2.0–2.0)
CALCIUM IONIZED ARTERIAL (MG/DL): 4.77 mg/dL (ref 4.40–5.40)
GLUCOSE WHOLE BLOOD: 227 mg/dL — ABNORMAL HIGH (ref 70–179)
HCO3 ARTERIAL: 25 mmol/L (ref 22–27)
HEMOGLOBIN BLOOD GAS: 8.5 g/dL — ABNORMAL LOW (ref 12.00–16.00)
LACTATE BLOOD ARTERIAL: 1.7 mmol/L — ABNORMAL HIGH (ref <1.3)
O2 SATURATION ARTERIAL: 95.2 % (ref 94.0–100.0)
PCO2 ARTERIAL: 34 mmHg — ABNORMAL LOW (ref 35.0–45.0)
PH ARTERIAL: 7.47 — ABNORMAL HIGH (ref 7.35–7.45)
PO2 ARTERIAL: 67.3 mmHg — ABNORMAL LOW (ref 80.0–110.0)
POTASSIUM WHOLE BLOOD: 4.1 mmol/L (ref 3.4–4.6)
SODIUM WHOLE BLOOD: 137 mmol/L (ref 135–145)

## 2024-05-14 LAB — CBC W/ AUTO DIFF
BASOPHILS ABSOLUTE COUNT: 0 10*9/L (ref 0.0–0.1)
BASOPHILS ABSOLUTE COUNT: 0 10*9/L (ref 0.0–0.1)
BASOPHILS ABSOLUTE COUNT: 0.1 10*9/L (ref 0.0–0.1)
BASOPHILS RELATIVE PERCENT: 0.4 %
BASOPHILS RELATIVE PERCENT: 0.2 %
BASOPHILS RELATIVE PERCENT: 1 %
EOSINOPHILS ABSOLUTE COUNT: 0.3 10*9/L (ref 0.0–0.5)
EOSINOPHILS ABSOLUTE COUNT: 0.3 10*9/L (ref 0.0–0.5)
EOSINOPHILS ABSOLUTE COUNT: 0.3 10*9/L (ref 0.0–0.5)
EOSINOPHILS RELATIVE PERCENT: 2.8 %
EOSINOPHILS RELATIVE PERCENT: 2.6 %
EOSINOPHILS RELATIVE PERCENT: 2.6 %
HEMATOCRIT: 27.1 % — ABNORMAL LOW (ref 34.0–44.0)
HEMATOCRIT: 26.8 % — ABNORMAL LOW (ref 34.0–44.0)
HEMATOCRIT: 26.1 % — ABNORMAL LOW (ref 34.0–44.0)
HEMOGLOBIN: 9 g/dL — ABNORMAL LOW (ref 11.3–14.9)
HEMOGLOBIN: 8.8 g/dL — ABNORMAL LOW (ref 11.3–14.9)
HEMOGLOBIN: 8.8 g/dL — ABNORMAL LOW (ref 11.3–14.9)
LYMPHOCYTES ABSOLUTE COUNT: 1.2 10*9/L (ref 1.1–3.6)
LYMPHOCYTES ABSOLUTE COUNT: 1.3 10*9/L (ref 1.1–3.6)
LYMPHOCYTES ABSOLUTE COUNT: 1.1 10*9/L (ref 1.1–3.6)
LYMPHOCYTES RELATIVE PERCENT: 10.5 %
LYMPHOCYTES RELATIVE PERCENT: 10.1 %
LYMPHOCYTES RELATIVE PERCENT: 8.6 %
MEAN CORPUSCULAR HEMOGLOBIN CONC: 33.3 g/dL (ref 32.0–36.0)
MEAN CORPUSCULAR HEMOGLOBIN CONC: 32.9 g/dL (ref 32.0–36.0)
MEAN CORPUSCULAR HEMOGLOBIN CONC: 33.5 g/dL (ref 32.0–36.0)
MEAN CORPUSCULAR HEMOGLOBIN: 29.9 pg (ref 25.9–32.4)
MEAN CORPUSCULAR HEMOGLOBIN: 29.8 pg (ref 25.9–32.4)
MEAN CORPUSCULAR HEMOGLOBIN: 29.9 pg (ref 25.9–32.4)
MEAN CORPUSCULAR VOLUME: 89.7 fL (ref 77.6–95.7)
MEAN CORPUSCULAR VOLUME: 90.7 fL (ref 77.6–95.7)
MEAN CORPUSCULAR VOLUME: 89.1 fL (ref 77.6–95.7)
MEAN PLATELET VOLUME: 9.8 fL (ref 6.8–10.7)
MEAN PLATELET VOLUME: 10.1 fL (ref 6.8–10.7)
MEAN PLATELET VOLUME: 9.6 fL (ref 6.8–10.7)
MONOCYTES ABSOLUTE COUNT: 1.6 10*9/L — ABNORMAL HIGH (ref 0.3–0.8)
MONOCYTES ABSOLUTE COUNT: 1.5 10*9/L — ABNORMAL HIGH (ref 0.3–0.8)
MONOCYTES ABSOLUTE COUNT: 1.4 10*9/L — ABNORMAL HIGH (ref 0.3–0.8)
MONOCYTES RELATIVE PERCENT: 13.8 %
MONOCYTES RELATIVE PERCENT: 12.4 %
MONOCYTES RELATIVE PERCENT: 10.7 %
NEUTROPHILS ABSOLUTE COUNT: 8.5 10*9/L — ABNORMAL HIGH (ref 1.8–7.8)
NEUTROPHILS ABSOLUTE COUNT: 9.3 10*9/L — ABNORMAL HIGH (ref 1.8–7.8)
NEUTROPHILS ABSOLUTE COUNT: 10.2 10*9/L — ABNORMAL HIGH (ref 1.8–7.8)
NEUTROPHILS RELATIVE PERCENT: 72.5 %
NEUTROPHILS RELATIVE PERCENT: 74.7 %
NEUTROPHILS RELATIVE PERCENT: 77.1 %
PLATELET COUNT: 122 10*9/L — ABNORMAL LOW (ref 150–450)
PLATELET COUNT: 128 10*9/L — ABNORMAL LOW (ref 150–450)
PLATELET COUNT: 127 10*9/L — ABNORMAL LOW (ref 150–450)
RED BLOOD CELL COUNT: 3.02 10*12/L — ABNORMAL LOW (ref 3.95–5.13)
RED BLOOD CELL COUNT: 2.96 10*12/L — ABNORMAL LOW (ref 3.95–5.13)
RED BLOOD CELL COUNT: 2.93 10*12/L — ABNORMAL LOW (ref 3.95–5.13)
RED CELL DISTRIBUTION WIDTH: 19.7 % — ABNORMAL HIGH (ref 12.2–15.2)
RED CELL DISTRIBUTION WIDTH: 19.2 % — ABNORMAL HIGH (ref 12.2–15.2)
RED CELL DISTRIBUTION WIDTH: 19.4 % — ABNORMAL HIGH (ref 12.2–15.2)
WBC ADJUSTED: 11.7 10*9/L — ABNORMAL HIGH (ref 3.6–11.2)
WBC ADJUSTED: 12.4 10*9/L — ABNORMAL HIGH (ref 3.6–11.2)
WBC ADJUSTED: 13.2 10*9/L — ABNORMAL HIGH (ref 3.6–11.2)

## 2024-05-14 LAB — BLOOD GAS, ARTERIAL
BASE EXCESS ARTERIAL: 2.2 — ABNORMAL HIGH (ref -2.0–2.0)
HCO3 ARTERIAL: 26 mmol/L (ref 22–27)
O2 SATURATION ARTERIAL: 96.6 % (ref 94.0–100.0)
PCO2 ARTERIAL: 34.6 mmHg — ABNORMAL LOW (ref 35.0–45.0)
PH ARTERIAL: 7.48 — ABNORMAL HIGH (ref 7.35–7.45)
PO2 ARTERIAL: 73.1 mmHg — ABNORMAL LOW (ref 80.0–110.0)

## 2024-05-14 LAB — CBC
HEMATOCRIT: 25.7 % — ABNORMAL LOW (ref 34.0–44.0)
HEMOGLOBIN: 8.4 g/dL — ABNORMAL LOW (ref 11.3–14.9)
MEAN CORPUSCULAR HEMOGLOBIN CONC: 32.7 g/dL (ref 32.0–36.0)
MEAN CORPUSCULAR HEMOGLOBIN: 29.6 pg (ref 25.9–32.4)
MEAN CORPUSCULAR VOLUME: 90.6 fL (ref 77.6–95.7)
MEAN PLATELET VOLUME: 9.6 fL (ref 6.8–10.7)
PLATELET COUNT: 119 10*9/L — ABNORMAL LOW (ref 150–450)
RED BLOOD CELL COUNT: 2.84 10*12/L — ABNORMAL LOW (ref 3.95–5.13)
RED CELL DISTRIBUTION WIDTH: 19.3 % — ABNORMAL HIGH (ref 12.2–15.2)
WBC ADJUSTED: 12.1 10*9/L — ABNORMAL HIGH (ref 3.6–11.2)

## 2024-05-14 LAB — MAGNESIUM
MAGNESIUM: 2.4 mg/dL (ref 1.6–2.6)
MAGNESIUM: 2.2 mg/dL (ref 1.6–2.6)
MAGNESIUM: 2.2 mg/dL (ref 1.6–2.6)

## 2024-05-14 LAB — PHOSPHORUS
PHOSPHORUS: 2.1 mg/dL — ABNORMAL LOW (ref 2.4–5.1)
PHOSPHORUS: 2.1 mg/dL — ABNORMAL LOW (ref 2.4–5.1)
PHOSPHORUS: 2.9 mg/dL (ref 2.4–5.1)

## 2024-05-14 MED ADMIN — potassium & sodium phosphates 250mg (PHOS-NAK/NEUTRA PHOS) packet 2 packet: 2 | GASTROENTERAL | @ 04:00:00 | Stop: 2024-05-14

## 2024-05-14 MED ADMIN — potassium & sodium phosphates 250mg (PHOS-NAK/NEUTRA PHOS) packet 2 packet: 2 | GASTROENTERAL | @ 21:00:00 | Stop: 2024-05-14

## 2024-05-14 MED ADMIN — sodium chloride (NS) 0.9 % flush 10 mL: 10 mL | INTRAVENOUS | @ 07:00:00

## 2024-05-14 MED ADMIN — ursodiol (ACTIGALL) oral suspension: 300 mg | GASTROENTERAL | @ 12:00:00

## 2024-05-14 MED ADMIN — insulin NPH (HumuLIN,NovoLIN) injection 24 Units: 24 [IU] | SUBCUTANEOUS | @ 21:00:00

## 2024-05-14 MED ADMIN — insulin NPH (HumuLIN,NovoLIN) injection 60 Units: 60 [IU] | SUBCUTANEOUS | @ 10:00:00 | Stop: 2024-05-14

## 2024-05-14 MED ADMIN — sodium chloride (NS) 0.9 % flush 10 mL: 10 mL | INTRAVENOUS | @ 17:00:00

## 2024-05-14 MED ADMIN — lactulose oral solution: 20 g | GASTROENTERAL

## 2024-05-14 MED ADMIN — clobetasol (TEMOVATE) 0.05 % ointment: TOPICAL | @ 12:00:00

## 2024-05-14 MED ADMIN — levETIRAcetam (KEPPRA) oral solution: 750 mg | GASTROENTERAL | @ 12:00:00

## 2024-05-14 MED ADMIN — NORepinephrine 8 mg in dextrose 5 % 250 mL (32 mcg/mL) infusion PMB: 0-40 ug/min | INTRAVENOUS | @ 03:00:00

## 2024-05-14 MED ADMIN — insulin regular (HumuLIN,NovoLIN) injection 15 Units: 15 [IU] | SUBCUTANEOUS | @ 10:00:00

## 2024-05-14 MED ADMIN — midodrine (PROAMATINE) tablet 10 mg: 10 mg | ORAL | @ 21:00:00

## 2024-05-14 MED ADMIN — enoxaparin (LOVENOX) syringe 40 mg: 40 mg | SUBCUTANEOUS | @ 12:00:00

## 2024-05-14 MED ADMIN — insulin regular (HumuLIN,NovoLIN) inj CORRECTIONAL 0-20 Units: 0-20 [IU] | SUBCUTANEOUS | @ 03:00:00

## 2024-05-14 MED ADMIN — multivitamins, therapeutic with minerals tablet 1 tablet: 1 | GASTROENTERAL | @ 12:00:00

## 2024-05-14 MED ADMIN — midodrine (PROAMATINE) tablet 10 mg: 10 mg | ORAL | @ 12:00:00

## 2024-05-14 MED ADMIN — melatonin tablet 3 mg: 3 mg | ORAL | @ 04:00:00

## 2024-05-14 MED ADMIN — potassium & sodium phosphates 250mg (PHOS-NAK/NEUTRA PHOS) packet 2 packet: 2 | GASTROENTERAL | @ 17:00:00 | Stop: 2024-05-14

## 2024-05-14 MED ADMIN — insulin regular (HumuLIN,NovoLIN) injection 15 Units: 15 [IU] | SUBCUTANEOUS | @ 03:00:00

## 2024-05-14 MED ADMIN — esomeprazole (NEXIUM) granules 40 mg: 40 mg | GASTROENTERAL | @ 12:00:00

## 2024-05-14 MED ADMIN — potassium & sodium phosphates 250mg (PHOS-NAK/NEUTRA PHOS) packet 2 packet: 2 | GASTROENTERAL | @ 10:00:00 | Stop: 2024-05-14

## 2024-05-14 MED ADMIN — midodrine (PROAMATINE) tablet 10 mg: 10 mg | ORAL | @ 17:00:00

## 2024-05-14 MED ADMIN — esomeprazole (NEXIUM) granules 40 mg: 40 mg | GASTROENTERAL

## 2024-05-14 MED ADMIN — insulin regular (HumuLIN,NovoLIN) inj CORRECTIONAL 0-20 Units: 0-20 [IU] | SUBCUTANEOUS | @ 10:00:00

## 2024-05-14 MED ADMIN — levETIRAcetam (KEPPRA) oral solution: 750 mg | GASTROENTERAL

## 2024-05-14 NOTE — Unmapped (Signed)
 PICC LINE REFERRAL NOTE    The Venous Access Team has assessed this patient for the placement of a PICC line.  This patient was found to be an inappropriate bedside PICC line  candidate due to renal disease that may require future fistula or graft. Dr Elsworth Halt of nephrology recommends against PICC placement.     Recommend tunneled line at this time.         Dr Rexine Cater of the MICU service has been notified. Dr Lucinda Saber will place VIR consult order.     Thank You,    Darin Edinger, RN Venous Access Team [phone]    Workup Time:  30 minutes

## 2024-05-14 NOTE — Unmapped (Addendum)
 A/Ox2-3. Confused to month and day but can now count serially and knows year. Now able to state location and city and CAM negative although remains positive for disorganized thinking. RASS 0. Afebrile. Delayed responses improving. Afebrile. On RA. Off levo by 0730 this morning. Systolics maintained at >100 per order. Started on oral diet this afternoon and tolerated eating and afternoon meds by mouth. NG pulled and CVAD removed this afternoon and pt tolerated well. Q2h turns. Wound care completed. Will continue to monitor.     Problem: Adult Inpatient Plan of Care  Goal: Plan of Care Review  Outcome: Progressing  Goal: Patient-Specific Goal (Individualized)  Outcome: Progressing  Goal: Absence of Hospital-Acquired Illness or Injury  Outcome: Progressing  Intervention: Identify and Manage Fall Risk  Recent Flowsheet Documentation  Taken 05/14/2024 0800 by Annamarie Kid, RN  Safety Interventions:   aspiration precautions   bleeding precautions   fall reduction program maintained  Intervention: Prevent Skin Injury  Recent Flowsheet Documentation  Taken 05/14/2024 1200 by Annamarie Kid, RN  Positioning for Skin: Right  Taken 05/14/2024 1000 by Annamarie Kid, RN  Positioning for Skin: Left  Taken 05/14/2024 0800 by Annamarie Kid, RN  Positioning for Skin: Right  Intervention: Prevent and Manage VTE (Venous Thromboembolism) Risk  Recent Flowsheet Documentation  Taken 05/14/2024 1200 by Annamarie Kid, RN  Anti-Embolism Device Status: (lovenox ) Other (Comment)  Taken 05/14/2024 1000 by Annamarie Kid, RN  Anti-Embolism Device Status: (lovenox ) Other (Comment)  Taken 05/14/2024 0800 by Annamarie Kid, RN  Anti-Embolism Device Status: (lovenox ) Other (Comment)  Goal: Optimal Comfort and Wellbeing  Outcome: Progressing  Goal: Readiness for Transition of Care  Outcome: Progressing  Goal: Rounds/Family Conference  Outcome: Progressing     Problem: Skin Injury Risk Increased  Goal: Skin Health and Integrity  Outcome: Progressing  Intervention: Optimize Skin Protection  Recent Flowsheet Documentation  Taken 05/14/2024 0800 by Annamarie Kid, RN  Pressure Reduction Techniques:   heels elevated off bed   weight shift assistance provided  Head of Bed [facility]) Positioning: HOB elevated     Problem: Fall Injury Risk  Goal: Absence of Fall and Fall-Related Injury  Outcome: Progressing  Intervention: Promote Injury-Free Environment  Recent Flowsheet Documentation  Taken 05/14/2024 0800 by Annamarie Kid, RN  Safety Interventions:   aspiration precautions   bleeding precautions   fall reduction program maintained     Problem: Non-Violent Restraints  Goal: Patient will remain free of restraint events  Outcome: Progressing  Goal: Patient will remain free of physical injury  Outcome: Progressing     Problem: Wound  Goal: Optimal Coping  Outcome: Progressing  Goal: Optimal Functional Ability  Outcome: Progressing  Goal: Absence of Infection Signs and Symptoms  Outcome: Progressing  Goal: Improved Oral Intake  Outcome: Progressing  Goal: Optimal Pain Control and Function  Outcome: Progressing  Goal: Skin Health and Integrity  Outcome: Progressing  Intervention: Optimize Skin Protection  Recent Flowsheet Documentation  Taken 05/14/2024 0800 by Annamarie Kid, RN  Pressure Reduction Techniques:   heels elevated off bed   weight shift assistance provided  Head of Bed (HOB) Positioning: HOB elevated  Goal: Optimal Wound Healing  Outcome: Progressing     Problem: Self-Care Deficit  Goal: Improved Ability to Complete Activities of Daily Living  Outcome: Progressing     Problem: Mechanical Ventilation Invasive  Goal: Effective Communication  Outcome: Progressing  Goal: Optimal Device Function  Outcome: Progressing  Goal: Mechanical Ventilation Liberation  Outcome: Progressing  Goal: Optimal Nutrition Delivery  Outcome: Progressing  Goal: Absence of Device-Related Skin and Tissue Injury  Outcome: Progressing  Goal: Absence of Ventilator-Induced Lung Injury  Outcome: Progressing  Intervention: Prevent Ventilator-Associated Pneumonia  Recent Flowsheet Documentation  Taken 05/14/2024 0800 by Annamarie Kid, RN  Head of Bed [facility]) Positioning: HOB elevated     Problem: Artificial Airway  Goal: Effective Communication  Outcome: Progressing  Goal: Optimal Device Function  Outcome: Progressing  Intervention: Optimize Device Care and Function  Recent Flowsheet Documentation  Taken 05/14/2024 0800 by Annamarie Kid, RN  Aspiration Precautions:   awake/alert before oral intake   upright posture maintained   respiratory status monitored   oral hygiene care promoted  Goal: Absence of Device-Related Skin or Tissue Injury  Outcome: Progressing

## 2024-05-14 NOTE — Unmapped (Signed)
 MICU Daily Progress Note     Date of Service: 05/14/2024    Problem List:   Principal Problem:    Shock     Active Problems:    Type 2 diabetes mellitus, with long-term current use of insulin        Hyperlipidemia    Hypertension    Coronary artery disease involving native heart without angina pectoris    Diabetes mellitus       Esophageal varices in cirrhosis       Hepatic encephalopathy       Calcified cerebral meningioma       Portal hypertension       Other chronic pain    OSA (obstructive sleep apnea)    Stage 3a chronic kidney disease (CMS-HCC)    Hematemesis      Interval history: Ashley Briggs is a 58 y.o. female with PMH CAD s/p NSTEMI, CKD, NAS cirrhosis c/b ascites, hepatic encephalopathy and EV s/p recent banding admitted with UGI bleed 2/2 bleeding esophageal ulcers s/p unsusccessful TIPS c/b pulsatile VT s/p shock requiring transfer to [facility] ICU. Course complicated by severe hemorrhagic shock 2/2 gastric bleed now s/p TIPS with course complicated by acute hypoxic respiratory failure, AKI, stress cardiomyopathy and hepatic encephalopathy. Hgb stable. Extubated 6/12 and weaned to RA. Improving pressor requirement, requiring norepi intermittently overnight, successfully weaned this morning.     24hr events:   - Discontinued norepi   - Removed LIJ line  - Trial advancing diet  - Adjusting FWF    Neurological   Sedation (resolved)  Sedation held and extubated 6/12. Now A&O.     History of seizure-like activity (stable)  - Home Keppra  750mg  BID    Chronic pain  History of chronic pain, treated with baclofen , naltrexone , oxycodone .   - Hold home baclofen , naltrextone, oxycodone  in the setting of hypotension and sedation    Major depressive disorder (stable)  - Hold home bupoprion, citalopram     Insomnia (stable)  - Hold home zolpidem     PMH calcified cerebral meningioma (stable)  - no intervention at this time    Analgesia: pain adequately controlled    Pulmonary   Intubation and mechanical ventilation (resolved)  Previously intubated for procedures and altered mental status. Extubated 6/5, re-intubated later the same day for worsening hypoxia. Successful extubated 6/12 and now weaned to RA.     Pulmonary edema (stable)  Increasing hypoxia on 6/5, ultimately requiring re-intubation with FiO2 100%. CXR demonstrated moderate pulmonary edema. Etiology of pulmonary edema most likely cardiogenic given concurrent suspected stress cardiomyopathy, though she is also at risk for ARDS and noncardiogenic pulmonary edema cannot be excluded. Diuresed with improving oxygen requirement, now on room air.   - CTM    Left pleural effusion (stable)  CXR 6/4 remarkable for moderate Left pleural effusion with passive atelectasis. S/p thoracentesis on 6/7. Pleural fluid c/w transudative effusion by protein parameters.   - CTM    Cardiovascular   Hemorrhagic shock 2/2 refractory portal hypertensive bleeding (improving)  Arrived to [facility] MICU in undifferentiated shock requiring 2 pressors. Acutely worsening hypotension on 6/4 requiring fourth pressor, aggressive fluid resuscitation, and massive transfusion protocol. S/p emergent TIPS on 6/4. Isolated fever on 6/6, prerenal AKI, and central venous O2 nearly 90 concerning for some component of septic shock, though infectious workup remains unremarkable, and central venous O2 may be confounded in the setting of cirrhosis. Norepi ultimately weaned 6/14.   - Increase midodrine  to 10mg  TID    Stress cardiomyopathy -  Demand Ischemia   With elevated troponin on admission (5.4k) likely 2/2 demand ischemia in the setting of shock. Echo on admission overall normal with EF > 55%. Developed several episodes of wide complex tachycardia on 6/5. Bedside POCUS with apical ballooning and slightly decreased EF to 45-50%. EKG was without acute ischemic changes. Troponin peaked at 23k (6/6). Formal TTE demonstrated reduced EF to 45% with basal and midventricular hypokinesis. Findings favored to represent stress cardiomyopathy rather than ACS in the absence of acute EKG changes. Repeat TTE 6/10 with EF 40%, marginally decreased from prior.   - Cardiology consulted, appreciate recs   - Low concern for true ACS, no need for heparinization or ischemic eval acutely    Wide complex tachycardia - Hx ventricular tachycardia  Developed VT arrest requiring shock at OSH with initial TIPS attempt. On 6/5 developed several episodes of wide complex tachycardia. EKG with tachycardia to 130s, thought by cardiology to represent a fib with abberancy rather than VT. She was stabilized with amiodarone  bolus, followed by amiodarone  gtt and then PO amio. Finished amio load 6/12, no role for amio moving forward as this was precipitated iso critical illness.   - Cardiology consulted, appreciate recs   - Off amiodarone  gtt   - S/p po amiodarone  load  - q8h BMP  - Replete K>4, Mg>2    CAD, prior NSTEMI  Prior NSTEMI per chart review. No LHC data available. Unclear if she has history of coronary stent.   - Home aspirin  held ISO bleeding    Renal   Prerenal AKI, c/f HRS (resolved)  Cr elevated to 1.32 from baseline ~0.8. Urine lytes most consistent with prerenal etiology, 2/2 hypoperfusion iso shock, unlikely HRS. Cr slowly improving, now with increasing urine output.   - Management of contributing etiologies as above  - q8h BMP     Infectious Disease/Autoimmune   Undifferentiated shock, c/f septic shock - Fever (resolved) - Leukocytosis (resolved)  Presented to [facility] MICU in 2-pressor shock with leukocytosis to 24.9. UA noninfectious, CXR without pneumonia. Bcx NGTD x 24 hours. Most likely infectious source intra-abdominal. SBP considered, unfortunately ascites from paracentesis on 6/4 were not sent for diagnostic studies. No skin findings on exam to suggest SSTI. Femoral line placed at OSH possibly an infectious source, though Bcx have been NGTD. S/p vanc/zosyn . S/p tobramycin  x 1. With fever to 38.4 on 6/6. Continues now on vanc/zosyn . Paracentesis with PMNs < 250. Thoracentesis transudative. UA noninfectious. Lrcx gram stain with 1+ yeast, 1+ GPC. S/p 6 days of broad spectrum antibiotic coverage thorough workup unremarkable for underlying infectious etiology. Fluctuating single pressor requirement since 6/9, repeat infectious workup negative thus far; low clinical concern for infection at this time.   - Bcx ngtd  - Ucx 6/4 negative  - Paracentesis 6/7 with ANC < 250, thoracentesis transudative  - Lrcx growing oropharyngeal flora  - Deferring further antibiotics iso low concern for infection    Cultures:  Blood Culture, Routine (no units)   Date Value   05/06/2024 No Growth at 5 days   05/06/2024 No Growth at 5 days     Urine Culture, Comprehensive (no units)   Date Value   05/04/2024 NO GROWTH     Lower Respiratory Culture (no units)   Date Value   05/07/2024 OROPHARYNGEAL FLORA ISOLATED   05/06/2024 Specimen Not Processed     WBC (10*9/L)   Date Value   05/14/2024 11.7 (H)     WBC, UA (/HPF)   Date Value  05/10/2024 25 (H)          FEN/GI   Decompensated cirrhosis c/b esophageal varices, gastric varix, hepatic encephalopathy, ascites   Presenting with hematemesis 6 days after esophageal banding. Emergent TIPS at OSH not successful d/t inability to access the portal vein. EGD on 6/4 demonstrated nonbleeding esophageal ulcers at site of prior banding, stomach completely occluded by blood which could not be cleared endoscopically. With acute decline in Hb and hypotension on 6/4. Darius Edouard attempted with GI, but unsuccessful after numerous attempts. S/p salvage TIPS on 6/4 with no further GI bleeding.   - S/p salvage TIPS  - Hepatology following  - Daily MELLD labs (CMP, PT/INR)  - Maintain fibrinogen > 100 with cryo  - Home lasix , spironolactone  held d/t hypotension  - Continue rifaximin , continue lactulose  20g BID for goal 3-5 Bms daily    Acute on chronic liver failure - Ischemic hepatitis (improving)  LFTs continue to rise s/p variceal hemorrhage and TIPS. Unfortunately, precluded from transplant eval with newly reduced EF. LFTs slowly improving, suggesting ongoing recovery from TIPS and hemorrhagic event.  - Hepatology following    Hypernatremia  - Adjust FWF as needed to maintain Na 135-145    Malnutrition Assessment:   Tube feeds started 6/9. Post-extubation speech eval with recommendation for NPO, ice chips only. With improving bedside swallow eval by nursing 6/14, will advance diet as tolerate.   - Trickle feeds with FWF  - Trial oral diet       Heme/Coag   Blood loss anemia, likely 2/2 refractory portal hypertensive bleeding (stable)  S/p 3 units pRBCs at OSH, Hb 9.3 on arrival to MICU. S/p massive transfusion protocol on 6/4. Low clinical concern for ongoing blood loss with stable Hb, last transfusion 6/4.   - q8h CBC with diff    Mild Thrombocytopenia (stable)  Plts 253k on admission, acute decline to 50k on 6/5. Currently without signs of active bleeding. Plts remain stable around ~100k.  - q8h CBC with diff    Endocrine   Insulin -dependent T2DM  A1c 5.6 on admission. Home regimen lantus  60, lispro with meals. BG control challenging in the setting of critical illness and stress dose steroids. S/p insulin  infusion, converted to NPH 6/8. Ongoing hyperglycemia in the setting of continuous tube feeds, titrating insulin . Anticipate decline in insulin  requirements with transition from continuous tube feeds to regular diet.   - NPH 60 units BID  - Regular insulin  15 units QID  - Regular SSI QID    Integumentary   Psoriasis (stable)  History of psoriasis per chart review. With numerous chronic appearing scattered erythematous plaques with silvery scale. Of note, on Secukinumab  for psoriasis management, which can increase risk for infection.    # Irritant contact dermatitis (buttocks)  # Wound, R upper anterior leg  # Irritant contact dermatitis (anterior pelvis)  # Intertrigo along pannus  # Scalp wound 2/2 psoriasis  - WOCN consulted for high risk skin assessment Yes.  - Recommendations per WOCN note:  WOCN Recommendations:   - See nursing orders for wound care instructions.  - Contact WOCN with questions, concerns, or wound deterioration.  Topical Therapy/Interventions:   - Absorbent Dressing  - Antimicrobial Solutions  - Crusting (stoma powder or antifungal powder)  - Hydrofiber  - Zinc oxide barrier cream      Prophylaxis/LDA/Restraints/Consults   ICU checklist completed: yes (see ICU rounding navigator in Epic)    Patient Lines/Drains/Airways Status       Active Active Lines, Drains, &  Airways       Name Placement date Placement time Site Days    CVC Triple Lumen 05/05/24 Non-tunneled Left Internal jugular 05/05/24  1740  Internal jugular  8    NG/OG Tube Right nostril 05/06/24  1930  Right nostril  7    Urethral Catheter Temperature probe 05/13/24  1110  Temperature probe  less than 1    Peripheral IV 05/04/24 Anterior;Left;Upper Arm 05/04/24  0500  Arm  10    Peripheral IV 05/04/24 Anterior;Right;Upper Arm 05/04/24  0500  Arm  10    Arterial Line 05/10/24 Right Radial 05/10/24  1845  Radial  3                  Patient Lines/Drains/Airways Status       Active Wounds       Name Placement date Placement time Site Days    Surgical Site 05/04/24 Abdomen Lower;Right 05/04/24  2300  -- 9    Surgical Site 05/04/24 Neck Right 05/04/24  2300  -- 9    Surgical Site 05/04/24 Groin Right 05/04/24  2300  -- 9    Wound 05/12/24 Irritant Contact Dermatitis Rash/Dermatitis Buttocks 05/12/24  1220  Buttocks  1    Wound 05/12/24 Other (Comment) Leg Anterior;Right;Upper 05/12/24  1225  Leg  1    Wound 05/12/24 Irritant Contact Dermatitis Perspiration Pelvis Anterior;Right Intertrigo along pannus 05/12/24  1226  Pelvis  1    Wound 05/12/24 Other (Comment) Head Posterior;Upper 05/12/24  1227  Head  1                    Goals of Care     Code Status: Full Code    Designated Healthcare Decision Maker:  Ms. Ragin current decisional capacity for healthcare decision-making is Full capacity. Her designated healthcare decision maker(s) is/are   HCDM (patient stated preference): Caudle,Ryan - Relative - [phone].      Subjective     Patient lying in bed this morning. Alert and oriented to self, year, and in part to situation. Disoriented to place and date. Hoarse voice improving. Continuing to ask to eat and drink, speech eval yesterday with recommendation for NPO/ice chips only. Denies abdominal pain.     Objective     Vitals - past 24 hours  Temp:  [36.7 ??C (98.1 ??F)-37 ??C (98.6 ??F)] 36.7 ??C (98.1 ??F)  Pulse:  [82-97] 90  SpO2 Pulse:  [82-97] 87  Resp:  [12-23] 20  SpO2:  [93 %-99 %] 97 % Intake/Output  I/O last 3 completed shifts:  In: 7149.6 [P.O.:560; I.V.:316; NG/GT:5680; IV Piggyback:593.6]  Out: 2243 [Urine:2018; Stool:225]     Physical Exam:    General: Ill-appearing female, lying in bed.   HEENT: Mucous membranes dry. Sclerae icteric.   CV: RRR  Pulm: CTAB on anterior exam.  GI: Normoactive bowel sounds. Soft, nondistended. RUQ and epigastrium nontender to deep palpation.  Extremities: Trace edema BLE.   Skin: Diffuse scattered erythematous plaques with silvery scale. No bruising, petechiae, mucocutaneous bleeding.  Neuro: Alert and oriented to self and in part to situation, disoriented to time and place.     Continuous Infusions:    NORepinephrine  bitartrate-NS 1 mcg/min (05/14/24 0981)       Scheduled Medications:    clobetasol    Topical BID    enoxaparin  (LOVENOX ) injection  40 mg Subcutaneous Q24H    esomeprazole   40 mg Enteral tube: gastric BID    insulin  NPH  60 Units  Subcutaneous Q12H    insulin  regular  0-20 Units Subcutaneous Q6H SCH    insulin  regular  15 Units Subcutaneous Q6H    lactulose   20 g Enteral tube: gastric BID    levETIRAcetam   750 mg Enteral tube: gastric BID    midodrine   10 mg Oral TID    midodrine   5 mg Oral Once    multivitamins (ADULT)  1 tablet Enteral tube: gastric Daily    potassium & sodium phosphates 250mg   2 packet Enteral tube: gastric QID sodium chloride   10 mL Intravenous Q8H    sodium chloride   10 mL Intravenous Q8H    sodium chloride   10 mL Intravenous Q8H    ursodiol  300 mg Enteral tube: gastric 2xd PC       PRN medications:  dextrose  in water , glucagon, glucose, hydrOXYzine, melatonin    Data/Imaging Review: Reviewed in Epic and personally interpreted on 05/14/2024. See EMR for detailed results.    Vallery Gavel, MS4    I attest that I have reviewed the medical student note and that the components of the history of the present illness, the physical exam, and the assessment and plan documented were performed by me or were performed in my presence by the student where I verified the documentation and performed (or re-performed) the exam and medical decision making. Rexine Cater, MD

## 2024-05-14 NOTE — Unmapped (Signed)
 Patient is alert and oriented to self; does not endorse pain. Afebrile; NSR; Levo gtt on for less than an hour while asleep to maintain SBP >100. SpO2>93% on room air. Foley in place, 4 watery BM, tube feeds at goal. Foley care, sacrum wound care, oral care, q2h turns done. Please see flowsheet and MAR for vitals and trends.    Problem: Adult Inpatient Plan of Care  Goal: Plan of Care Review  Outcome: Ongoing - Unchanged  Flowsheets (Taken 05/14/2024 0501)  Progress: no change  Plan of Care Reviewed With: patient  Goal: Patient-Specific Goal (Individualized)  Outcome: Ongoing - Unchanged  Flowsheets (Taken 05/14/2024 0501)  Patient/Family-Specific Goals (Include Timeframe): Patient will maintain SBP >100 throughout shift ending 05/14/24.  Individualized Care Needs: Levo gtt, foley care  Anxieties, Fears or Concerns: diarrhea  Goal: Absence of Hospital-Acquired Illness or Injury  Outcome: Ongoing - Unchanged  Intervention: Identify and Manage Fall Risk  Recent Flowsheet Documentation  Taken 05/13/2024 2000 by Fernand Howard, Lynsie Mcwatters G, RN  Safety Interventions:   aspiration precautions   bleeding precautions   enteral feeding safety   fall reduction program maintained   lighting adjusted for tasks/safety   low bed  Intervention: Prevent Skin Injury  Recent Flowsheet Documentation  Taken 05/14/2024 0400 by Kyung Pi, RN  Positioning for Skin: Right  Taken 05/14/2024 0200 by Kyung Pi, RN  Positioning for Skin: Left  Taken 05/14/2024 0000 by Kyung Pi, RN  Positioning for Skin: Right  Taken 05/13/2024 2200 by Kyung Pi, RN  Positioning for Skin: Left  Taken 05/13/2024 2000 by Fernand Howard, Gillermo Poch G, RN  Positioning for Skin: Right  Device Skin Pressure Protection:   absorbent pad utilized/changed   tubing/devices free from skin contact  Skin Protection:   adhesive use limited   incontinence pads utilized   transparent dressing maintained   tubing/devices free from skin contact  Intervention: Prevent Infection  Recent Flowsheet Documentation  Taken 05/13/2024 2000 by Fernand Howard, Carel Carrier G, RN  Infection Prevention:   cohorting utilized   hand hygiene promoted   rest/sleep promoted   single patient room provided  Goal: Optimal Comfort and Wellbeing  Outcome: Ongoing - Unchanged  Goal: Readiness for Transition of Care  Outcome: Ongoing - Unchanged

## 2024-05-15 LAB — BLOOD GAS, ARTERIAL
BASE EXCESS ARTERIAL: 0.8 (ref -2.0–2.0)
HCO3 ARTERIAL: 24 mmol/L (ref 22–27)
O2 SATURATION ARTERIAL: 97.7 % (ref 94.0–100.0)
PCO2 ARTERIAL: 32 mmHg — ABNORMAL LOW (ref 35.0–45.0)
PH ARTERIAL: 7.48 — ABNORMAL HIGH (ref 7.35–7.45)
PO2 ARTERIAL: 80.6 mmHg (ref 80.0–110.0)

## 2024-05-15 LAB — COMPREHENSIVE METABOLIC PANEL
ALBUMIN: 2.2 g/dL — ABNORMAL LOW (ref 3.4–5.0)
ALBUMIN: 2.1 g/dL — ABNORMAL LOW (ref 3.4–5.0)
ALBUMIN: 2 g/dL — ABNORMAL LOW (ref 3.4–5.0)
ALKALINE PHOSPHATASE: 111 U/L (ref 46–116)
ALKALINE PHOSPHATASE: 114 U/L (ref 46–116)
ALKALINE PHOSPHATASE: 113 U/L (ref 46–116)
ALT (SGPT): 90 U/L — ABNORMAL HIGH (ref 10–49)
ALT (SGPT): 82 U/L — ABNORMAL HIGH (ref 10–49)
ALT (SGPT): 75 U/L — ABNORMAL HIGH (ref 10–49)
ANION GAP: 8 mmol/L (ref 5–14)
ANION GAP: 11 mmol/L (ref 5–14)
ANION GAP: 9 mmol/L (ref 5–14)
AST (SGOT): 65 U/L — ABNORMAL HIGH (ref <=34)
AST (SGOT): 62 U/L — ABNORMAL HIGH (ref <=34)
AST (SGOT): 69 U/L — ABNORMAL HIGH (ref <=34)
BILIRUBIN TOTAL: 15.2 mg/dL — ABNORMAL HIGH (ref 0.3–1.2)
BILIRUBIN TOTAL: 15.9 mg/dL — ABNORMAL HIGH (ref 0.3–1.2)
BILIRUBIN TOTAL: 15.8 mg/dL — ABNORMAL HIGH (ref 0.3–1.2)
BLOOD UREA NITROGEN: 45 mg/dL — ABNORMAL HIGH (ref 9–23)
BLOOD UREA NITROGEN: 41 mg/dL — ABNORMAL HIGH (ref 9–23)
BLOOD UREA NITROGEN: 37 mg/dL — ABNORMAL HIGH (ref 9–23)
BUN / CREAT RATIO: 58
BUN / CREAT RATIO: 53
BUN / CREAT RATIO: 46
CALCIUM: 8.6 mg/dL — ABNORMAL LOW (ref 8.7–10.4)
CALCIUM: 8.4 mg/dL — ABNORMAL LOW (ref 8.7–10.4)
CALCIUM: 8.2 mg/dL — ABNORMAL LOW (ref 8.7–10.4)
CHLORIDE: 105 mmol/L (ref 98–107)
CHLORIDE: 103 mmol/L (ref 98–107)
CHLORIDE: 103 mmol/L (ref 98–107)
CO2: 23 mmol/L (ref 20.0–31.0)
CO2: 22 mmol/L (ref 20.0–31.0)
CO2: 22 mmol/L (ref 20.0–31.0)
CREATININE: 0.77 mg/dL (ref 0.55–1.02)
CREATININE: 0.78 mg/dL (ref 0.55–1.02)
CREATININE: 0.8 mg/dL (ref 0.55–1.02)
EGFR CKD-EPI (2021) FEMALE: 89 mL/min/1.73m2 (ref >=60)
EGFR CKD-EPI (2021) FEMALE: 88 mL/min/1.73m2 (ref >=60)
EGFR CKD-EPI (2021) FEMALE: 85 mL/min/1.73m2 (ref >=60)
GLUCOSE RANDOM: 209 mg/dL — ABNORMAL HIGH (ref 70–179)
GLUCOSE RANDOM: 236 mg/dL — ABNORMAL HIGH (ref 70–179)
GLUCOSE RANDOM: 201 mg/dL — ABNORMAL HIGH (ref 70–179)
POTASSIUM: 4.2 mmol/L (ref 3.4–4.8)
POTASSIUM: 4.2 mmol/L (ref 3.4–4.8)
POTASSIUM: 4.3 mmol/L (ref 3.4–4.8)
PROTEIN TOTAL: 5.6 g/dL — ABNORMAL LOW (ref 5.7–8.2)
PROTEIN TOTAL: 6 g/dL (ref 5.7–8.2)
PROTEIN TOTAL: 5.9 g/dL (ref 5.7–8.2)
SODIUM: 136 mmol/L (ref 135–145)
SODIUM: 136 mmol/L (ref 135–145)
SODIUM: 134 mmol/L — ABNORMAL LOW (ref 135–145)

## 2024-05-15 LAB — CBC W/ AUTO DIFF
BASOPHILS ABSOLUTE COUNT: 0.1 10*9/L (ref 0.0–0.1)
BASOPHILS ABSOLUTE COUNT: 0.1 10*9/L (ref 0.0–0.1)
BASOPHILS ABSOLUTE COUNT: 0.1 10*9/L (ref 0.0–0.1)
BASOPHILS RELATIVE PERCENT: 0.5 %
BASOPHILS RELATIVE PERCENT: 0.8 %
BASOPHILS RELATIVE PERCENT: 0.3 %
EOSINOPHILS ABSOLUTE COUNT: 0.5 10*9/L (ref 0.0–0.5)
EOSINOPHILS ABSOLUTE COUNT: 0.4 10*9/L (ref 0.0–0.5)
EOSINOPHILS ABSOLUTE COUNT: 0.3 10*9/L (ref 0.0–0.5)
EOSINOPHILS RELATIVE PERCENT: 2.6 %
EOSINOPHILS RELATIVE PERCENT: 2 %
EOSINOPHILS RELATIVE PERCENT: 1.6 %
HEMATOCRIT: 27.7 % — ABNORMAL LOW (ref 34.0–44.0)
HEMATOCRIT: 28.7 % — ABNORMAL LOW (ref 34.0–44.0)
HEMATOCRIT: 28.3 % — ABNORMAL LOW (ref 34.0–44.0)
HEMOGLOBIN: 9.3 g/dL — ABNORMAL LOW (ref 11.3–14.9)
HEMOGLOBIN: 9.4 g/dL — ABNORMAL LOW (ref 11.3–14.9)
HEMOGLOBIN: 9.3 g/dL — ABNORMAL LOW (ref 11.3–14.9)
LYMPHOCYTES ABSOLUTE COUNT: 1.7 10*9/L (ref 1.1–3.6)
LYMPHOCYTES ABSOLUTE COUNT: 1.4 10*9/L (ref 1.1–3.6)
LYMPHOCYTES ABSOLUTE COUNT: 1 10*9/L — ABNORMAL LOW (ref 1.1–3.6)
LYMPHOCYTES RELATIVE PERCENT: 9.8 %
LYMPHOCYTES RELATIVE PERCENT: 7.7 %
LYMPHOCYTES RELATIVE PERCENT: 6.1 %
MEAN CORPUSCULAR HEMOGLOBIN CONC: 33.6 g/dL (ref 32.0–36.0)
MEAN CORPUSCULAR HEMOGLOBIN CONC: 32.7 g/dL (ref 32.0–36.0)
MEAN CORPUSCULAR HEMOGLOBIN CONC: 33 g/dL (ref 32.0–36.0)
MEAN CORPUSCULAR HEMOGLOBIN: 30.4 pg (ref 25.9–32.4)
MEAN CORPUSCULAR HEMOGLOBIN: 29.4 pg (ref 25.9–32.4)
MEAN CORPUSCULAR HEMOGLOBIN: 29.7 pg (ref 25.9–32.4)
MEAN CORPUSCULAR VOLUME: 90.3 fL (ref 77.6–95.7)
MEAN CORPUSCULAR VOLUME: 89.9 fL (ref 77.6–95.7)
MEAN CORPUSCULAR VOLUME: 90 fL (ref 77.6–95.7)
MEAN PLATELET VOLUME: 9.7 fL (ref 6.8–10.7)
MEAN PLATELET VOLUME: 9.5 fL (ref 6.8–10.7)
MEAN PLATELET VOLUME: 9.7 fL (ref 6.8–10.7)
MONOCYTES ABSOLUTE COUNT: 2 10*9/L — ABNORMAL HIGH (ref 0.3–0.8)
MONOCYTES ABSOLUTE COUNT: 2.2 10*9/L — ABNORMAL HIGH (ref 0.3–0.8)
MONOCYTES ABSOLUTE COUNT: 2.1 10*9/L — ABNORMAL HIGH (ref 0.3–0.8)
MONOCYTES RELATIVE PERCENT: 11.2 %
MONOCYTES RELATIVE PERCENT: 12.3 %
MONOCYTES RELATIVE PERCENT: 12.4 %
NEUTROPHILS ABSOLUTE COUNT: 13.5 10*9/L — ABNORMAL HIGH (ref 1.8–7.8)
NEUTROPHILS ABSOLUTE COUNT: 13.6 10*9/L — ABNORMAL HIGH (ref 1.8–7.8)
NEUTROPHILS ABSOLUTE COUNT: 13.2 10*9/L — ABNORMAL HIGH (ref 1.8–7.8)
NEUTROPHILS RELATIVE PERCENT: 75.9 %
NEUTROPHILS RELATIVE PERCENT: 77.2 %
NEUTROPHILS RELATIVE PERCENT: 79.6 %
PLATELET COUNT: 175 10*9/L (ref 150–450)
PLATELET COUNT: 170 10*9/L (ref 150–450)
PLATELET COUNT: 173 10*9/L (ref 150–450)
RED BLOOD CELL COUNT: 3.06 10*12/L — ABNORMAL LOW (ref 3.95–5.13)
RED BLOOD CELL COUNT: 3.19 10*12/L — ABNORMAL LOW (ref 3.95–5.13)
RED BLOOD CELL COUNT: 3.14 10*12/L — ABNORMAL LOW (ref 3.95–5.13)
RED CELL DISTRIBUTION WIDTH: 19.4 % — ABNORMAL HIGH (ref 12.2–15.2)
RED CELL DISTRIBUTION WIDTH: 19.7 % — ABNORMAL HIGH (ref 12.2–15.2)
RED CELL DISTRIBUTION WIDTH: 19.2 % — ABNORMAL HIGH (ref 12.2–15.2)
WBC ADJUSTED: 17.8 10*9/L — ABNORMAL HIGH (ref 3.6–11.2)
WBC ADJUSTED: 17.6 10*9/L — ABNORMAL HIGH (ref 3.6–11.2)
WBC ADJUSTED: 16.6 10*9/L — ABNORMAL HIGH (ref 3.6–11.2)

## 2024-05-15 LAB — SLIDE REVIEW

## 2024-05-15 LAB — PHOSPHORUS
PHOSPHORUS: 2.9 mg/dL (ref 2.4–5.1)
PHOSPHORUS: 3 mg/dL (ref 2.4–5.1)
PHOSPHORUS: 2.9 mg/dL (ref 2.4–5.1)

## 2024-05-15 LAB — URINALYSIS WITH MICROSCOPY
GLUCOSE UA: 70 — AB
KETONES UA: NEGATIVE
NITRITE UA: NEGATIVE
PH UA: 5.5 (ref 5.0–9.0)
SPECIFIC GRAVITY UA: 1.022 (ref 1.003–1.030)
UROBILINOGEN UA: <2

## 2024-05-15 LAB — MAGNESIUM
MAGNESIUM: 2.2 mg/dL (ref 1.6–2.6)
MAGNESIUM: 2.2 mg/dL (ref 1.6–2.6)
MAGNESIUM: 2.1 mg/dL (ref 1.6–2.6)

## 2024-05-15 LAB — PROTIME-INR
INR: 1.46
INR: 1.46
PROTIME: 16.6 s — ABNORMAL HIGH (ref 9.9–12.6)
PROTIME: 16.6 s — ABNORMAL HIGH (ref 9.9–12.6)

## 2024-05-15 MED ADMIN — insulin regular (HumuLIN,NovoLIN) inj CORRECTIONAL 0-20 Units: 0-20 [IU] | SUBCUTANEOUS | @ 03:00:00

## 2024-05-15 MED ADMIN — midodrine (PROAMATINE) tablet 10 mg: 10 mg | ORAL | @ 03:00:00 | Stop: 2024-05-14

## 2024-05-15 MED ADMIN — esomeprazole (NEXIUM) granules 40 mg: 40 mg | GASTROENTERAL | @ 12:00:00

## 2024-05-15 MED ADMIN — clobetasol (TEMOVATE) 0.05 % ointment: TOPICAL

## 2024-05-15 MED ADMIN — insulin regular (HumuLIN,NovoLIN) inj CORRECTIONAL 0-20 Units: 0-20 [IU] | SUBCUTANEOUS | @ 15:00:00

## 2024-05-15 MED ADMIN — piperacillin-tazobactam (ZOSYN) IVPB (premix) 4.5 g: 4.5 g | INTRAVENOUS | @ 22:00:00 | Stop: 2024-05-22

## 2024-05-15 MED ADMIN — piperacillin-tazobactam (ZOSYN) IVPB (premix) 4.5 g: 4.5 g | INTRAVENOUS | @ 05:00:00 | Stop: 2024-05-22

## 2024-05-15 MED ADMIN — multivitamins, therapeutic with minerals tablet 1 tablet: 1 | GASTROENTERAL | @ 12:00:00

## 2024-05-15 MED ADMIN — enoxaparin (LOVENOX) syringe 40 mg: 40 mg | SUBCUTANEOUS | @ 12:00:00

## 2024-05-15 MED ADMIN — piperacillin-tazobactam (ZOSYN) IVPB (premix) 4.5 g: 4.5 g | INTRAVENOUS | @ 10:00:00 | Stop: 2024-05-22

## 2024-05-15 MED ADMIN — clobetasol (TEMOVATE) 0.05 % ointment: TOPICAL | @ 12:00:00

## 2024-05-15 MED ADMIN — ursodiol (ACTIGALL) oral suspension: 300 mg | GASTROENTERAL | @ 21:00:00

## 2024-05-15 MED ADMIN — levETIRAcetam (KEPPRA) oral solution: 750 mg | GASTROENTERAL | @ 12:00:00

## 2024-05-15 MED ADMIN — lactated ringers bolus 1,000 mL: 1000 mL | INTRAVENOUS | @ 15:00:00 | Stop: 2024-05-15

## 2024-05-15 MED ADMIN — levETIRAcetam (KEPPRA) oral solution: 750 mg | GASTROENTERAL

## 2024-05-15 MED ADMIN — esomeprazole (NEXIUM) granules 40 mg: 40 mg | GASTROENTERAL

## 2024-05-15 MED ADMIN — piperacillin-tazobactam (ZOSYN) IVPB (premix) 4.5 g: 4.5 g | INTRAVENOUS | @ 17:00:00 | Stop: 2024-05-22

## 2024-05-15 MED ADMIN — NORepinephrine 8 mg in dextrose 5 % 250 mL (32 mcg/mL) infusion PMB: 0-30 ug/min | INTRAVENOUS | @ 06:00:00

## 2024-05-15 MED ADMIN — midodrine (PROAMATINE) tablet 10 mg: 10 mg | ORAL | @ 06:00:00

## 2024-05-15 MED ADMIN — insulin NPH (HumuLIN,NovoLIN) injection 24 Units: 24 [IU] | SUBCUTANEOUS | @ 09:00:00

## 2024-05-15 MED ADMIN — ursodiol (ACTIGALL) oral suspension: 300 mg | GASTROENTERAL | @ 12:00:00

## 2024-05-15 MED ADMIN — lactulose oral solution: 20 g | GASTROENTERAL | @ 12:00:00

## 2024-05-15 MED ADMIN — midodrine (PROAMATINE) tablet 10 mg: 10 mg | ORAL | @ 12:00:00

## 2024-05-15 MED ADMIN — insulin regular (HumuLIN,NovoLIN) inj CORRECTIONAL 0-20 Units: 0-20 [IU] | SUBCUTANEOUS | @ 21:00:00

## 2024-05-15 NOTE — Unmapped (Addendum)
 Patient alert,follows commands. NSR with Levo ranging from 5-6 mcg/min. Speech eval for swallowing ability. Diet reordered. BM X 2.   Problem: Adult Inpatient Plan of Care  Goal: Plan of Care Review  Outcome: Ongoing - Unchanged  Goal: Patient-Specific Goal (Individualized)  Outcome: Ongoing - Unchanged  Goal: Absence of Hospital-Acquired Illness or Injury  Outcome: Ongoing - Unchanged  Intervention: Identify and Manage Fall Risk  Recent Flowsheet Documentation  Taken 05/15/2024 0800 by Mansfield Seip, RN  Safety Interventions: aspiration precautions  Intervention: Prevent Skin Injury  Recent Flowsheet Documentation  Taken 05/15/2024 1400 by Mansfield Seip, RN  Positioning for Skin: Left  Taken 05/15/2024 1200 by Mansfield Seip, RN  Positioning for Skin: Right  Taken 05/15/2024 1000 by Mansfield Seip, RN  Positioning for Skin: Left  Taken 05/15/2024 0800 by Mansfield Seip, RN  Positioning for Skin: Right  Device Skin Pressure Protection:   adhesive use limited   absorbent pad utilized/changed   positioning supports utilized   pressure points protected  Skin Protection: adhesive use limited  Intervention: Prevent and Manage VTE (Venous Thromboembolism) Risk  Recent Flowsheet Documentation  Taken 05/15/2024 1400 by Blondell Burgess, Norah Beals, RN  Anti-Embolism Device Status: (SQ Lovenox ) Other (Comment)  Taken 05/15/2024 1200 by Blondell Burgess, Norah Beals, RN  Anti-Embolism Device Status: (SQ Lovenox ) Other (Comment)  Taken 05/15/2024 1000 by Blondell Burgess, Norah Beals, RN  Anti-Embolism Device Status: (SQ Lovenox ) Other (Comment)  Taken 05/15/2024 0800 by Blondell Burgess, Norah Beals, RN  Anti-Embolism Device Status: (SQ Lovenox ) Other (Comment)  Intervention: Prevent Infection  Recent Flowsheet Documentation  Taken 05/15/2024 0800 by Blondell Burgess, Norah Beals, RN  Infection Prevention:   environmental surveillance performed   equipment surfaces disinfected hand hygiene promoted   rest/sleep promoted  Goal: Optimal Comfort and Wellbeing  Outcome: Ongoing - Unchanged  Goal: Readiness for Transition of Care  Outcome: Ongoing - Unchanged  Goal: Rounds/Family Conference  Outcome: Ongoing - Unchanged     Problem: Skin Injury Risk Increased  Goal: Skin Health and Integrity  Outcome: Ongoing - Unchanged  Intervention: Optimize Skin Protection  Recent Flowsheet Documentation  Taken 05/15/2024 0800 by Mansfield Seip, RN  Activity Management: bedrest  Pressure Reduction Techniques: frequent weight shift encouraged  Head of Bed (HOB) Positioning: HOB at 30-45 degrees  Pressure Reduction Devices: pressure-redistributing mattress utilized  Skin Protection: adhesive use limited     Problem: Fall Injury Risk  Goal: Absence of Fall and Fall-Related Injury  Outcome: Ongoing - Unchanged  Intervention: Promote Scientist, clinical (histocompatibility and immunogenetics) Documentation  Taken 05/15/2024 0800 by Mansfield Seip, RN  Safety Interventions: aspiration precautions       Problem: Wound  Goal: Optimal Coping  Outcome: Ongoing - Unchanged  Goal: Optimal Functional Ability  Outcome: Ongoing - Unchanged  Intervention: Optimize Functional Ability  Recent Flowsheet Documentation  Taken 05/15/2024 0800 by Mansfield Seip, RN  Activity Management: bedrest  Goal: Absence of Infection Signs and Symptoms  Outcome: Ongoing - Unchanged  Intervention: Prevent or Manage Infection  Recent Flowsheet Documentation  Taken 05/15/2024 0800 by Mansfield Seip, RN  Infection Management: aseptic technique maintained  Goal: Improved Oral Intake  Outcome: Ongoing - Unchanged  Goal: Optimal Pain Control and Function  Outcome: Ongoing - Unchanged  Intervention: Prevent or Manage Pain  Recent Flowsheet Documentation  Taken 05/15/2024 0800 by Mansfield Seip, RN  Sleep/Rest Enhancement: regular sleep/rest pattern promoted  Goal: Skin Health and Integrity  Outcome: Ongoing - Unchanged  Intervention: Optimize Skin  Protection  Recent Flowsheet Documentation  Taken 05/15/2024 0800 by Mansfield Seip, RN  Activity Management: bedrest  Pressure Reduction Techniques: frequent weight shift encouraged  Head of Bed (HOB) Positioning: HOB at 30-45 degrees  Pressure Reduction Devices: pressure-redistributing mattress utilized  Skin Protection: adhesive use limited  Goal: Optimal Wound Healing  Outcome: Ongoing - Unchanged  Intervention: Promote Wound Healing  Recent Flowsheet Documentation  Taken 05/15/2024 0800 by Mansfield Seip, RN  Sleep/Rest Enhancement: regular sleep/rest pattern promoted     Problem: Self-Care Deficit  Goal: Improved Ability to Complete Activities of Daily Living  Outcome: Ongoing - Unchanged     Problem: Infection  Goal: Absence of Infection Signs and Symptoms  Outcome: Ongoing - Unchanged  Intervention: Prevent or Manage Infection  Recent Flowsheet Documentation  Taken 05/15/2024 0800 by Mansfield Seip, RN  Infection Management: aseptic technique maintained

## 2024-05-15 NOTE — Unmapped (Signed)
 Patient is A&Ox4, slight confusion, delayed responses; does not endorse pain. Afebrile; NSR; Levo gtt titrated to maintain SBP >100; midodrine  given 2x, blood cultures, urine culture, UA, CBC, ABG collected before pressors were restarted. SpO2 >93% on RA. 2 BM, foley in place. CHG, bath, q2h turns done. Please see flowsheet and MAR for vitals and trends.    Problem: Adult Inpatient Plan of Care  Goal: Plan of Care Review  Outcome: Ongoing - Unchanged  Flowsheets (Taken 05/15/2024 5784)  Progress: no change  Plan of Care Reviewed With: patient  Goal: Patient-Specific Goal (Individualized)  Outcome: Ongoing - Unchanged  Flowsheets (Taken 05/15/2024 6962)  Patient/Family-Specific Goals (Include Timeframe): Patient will maintain SBP >100 throughout shift ending 05/15/24.  Individualized Care Needs: Levo gtt, foley care, IV antibiotics, UA, urine culture, blood cultures, labs  Goal: Absence of Hospital-Acquired Illness or Injury  Outcome: Ongoing - Unchanged  Intervention: Identify and Manage Fall Risk  Recent Flowsheet Documentation  Taken 05/14/2024 2000 by Fernand Howard, Lj Miyamoto G, RN  Safety Interventions:   aspiration precautions   bleeding precautions   fall reduction program maintained   lighting adjusted for tasks/safety   low bed  Intervention: Prevent Skin Injury  Recent Flowsheet Documentation  Taken 05/15/2024 0600 by Kyung Pi, RN  Positioning for Skin: Left  Taken 05/15/2024 0400 by Kyung Pi, RN  Positioning for Skin: Right  Taken 05/15/2024 0200 by Kyung Pi, RN  Positioning for Skin: Left  Taken 05/15/2024 0000 by Kyung Pi, RN  Positioning for Skin: Right  Taken 05/14/2024 2200 by Kyung Pi, RN  Positioning for Skin: Left  Taken 05/14/2024 2000 by Fernand Howard, Tausha Milhoan G, RN  Positioning for Skin: Right  Device Skin Pressure Protection:   absorbent pad utilized/changed   adhesive use limited   tubing/devices free from skin contact  Skin Protection:   adhesive use limited   incontinence pads utilized   transparent dressing maintained   tubing/devices free from skin contact   zinc oxide barrier cream  Intervention: Prevent and Manage VTE (Venous Thromboembolism) Risk  Recent Flowsheet Documentation  Taken 05/15/2024 0600 by Kyung Pi, RN  Anti-Embolism Device Status: (sq Lovenox ) Other (Comment)  Taken 05/15/2024 0400 by Kyung Pi, RN  Anti-Embolism Device Status: (sq Lovenox ) Other (Comment)  Taken 05/15/2024 0200 by Kyung Pi, RN  Anti-Embolism Device Status: (sq Lovenox ) Other (Comment)  Taken 05/15/2024 0000 by Kyung Pi, RN  Anti-Embolism Device Status: (sq Lovenox ) Other (Comment)  Taken 05/14/2024 2200 by Kyung Pi, RN  Anti-Embolism Device Status: (sq Lovenox ) Other (Comment)  Taken 05/14/2024 2000 by Kyung Pi, RN  Anti-Embolism Device Status: (sq Lovenox ) Other (Comment)  Intervention: Prevent Infection  Recent Flowsheet Documentation  Taken 05/14/2024 2000 by Fernand Howard, Stacy Deshler G, RN  Infection Prevention:   cohorting utilized   hand hygiene promoted   rest/sleep promoted   single patient room provided  Goal: Optimal Comfort and Wellbeing  Outcome: Ongoing - Unchanged  Goal: Readiness for Transition of Care  Outcome: Ongoing - Unchanged

## 2024-05-15 NOTE — Unmapped (Signed)
 MICU Daily Progress Note     Date of Service: 05/15/2024    Problem List:   Principal Problem:    Shock     Active Problems:    Type 2 diabetes mellitus, with long-term current use of insulin        Hyperlipidemia    Hypertension    Coronary artery disease involving native heart without angina pectoris    Diabetes mellitus       Esophageal varices in cirrhosis       Hepatic encephalopathy       Calcified cerebral meningioma       Portal hypertension       Other chronic pain    OSA (obstructive sleep apnea)    Stage 3a chronic kidney disease (CMS-HCC)    Hematemesis      Interval history: Ashley Briggs is a 58 y.o. female with PMH CAD s/p NSTEMI, CKD, NAS cirrhosis c/b ascites, hepatic encephalopathy and EV s/p recent banding admitted with UGI bleed 2/2 bleeding esophageal ulcers s/p unsusccessful TIPS c/b pulsatile VT s/p shock requiring transfer to [facility] ICU. Course complicated by severe hemorrhagic shock 2/2 gastric bleed now s/p TIPS with course complicated by acute hypoxic respiratory failure, AKI, stress cardiomyopathy and hepatic encephalopathy. Hgb stable. Extubated 6/12 and weaned to RA. Improving pressor requirement, requiring norepi intermittently overnight, successfully weaned this morning.     24hr events:   - Patient made NPO due to concern that she aspirated (rising WBC, pressor need). Will obtain SLP eval.   - Holding scheduled insulin  due to being NPO.  - Patient placed on Zosyn  overnight, continued on day shift.    Neurological   Sedation (resolved)  Sedation held and extubated 6/12. Now A&O.     History of seizure-like activity (stable)  - Home Keppra  750mg  BID    Chronic pain  History of chronic pain, treated with baclofen , naltrexone , oxycodone .   - Hold home baclofen , naltrextone, oxycodone  in the setting of hypotension and sedation    Major depressive disorder (stable)  - Hold home bupoprion, citalopram     Insomnia (stable)  - Hold home zolpidem     PMH calcified cerebral meningioma (stable)  - no intervention at this time    Analgesia: pain adequately controlled    Pulmonary   Intubation and mechanical ventilation (resolved)  Previously intubated for procedures and altered mental status. Extubated 6/5, re-intubated later the same day for worsening hypoxia. Successfully extubated 6/12 and now weaned to RA.     Pulmonary edema (stable)  Increasing hypoxia on 6/5, ultimately requiring re-intubation with FiO2 100%. CXR demonstrated moderate pulmonary edema. Etiology of pulmonary edema most likely cardiogenic given concurrent suspected stress cardiomyopathy, though she is also at risk for ARDS and noncardiogenic pulmonary edema cannot be excluded. Diuresed with improving oxygen requirement, now on room air.   - CTM    Left pleural effusion (stable)  CXR 6/4 remarkable for moderate Left pleural effusion with passive atelectasis. S/p thoracentesis on 6/7. Pleural fluid c/w transudative effusion by protein parameters.   - CTM    Cardiovascular   Hemorrhagic shock 2/2 refractory portal hypertensive bleeding (improving)  Arrived to [facility] MICU in undifferentiated shock requiring 2 pressors. Acutely worsening hypotension on 6/4 requiring fourth pressor, aggressive fluid resuscitation, and massive transfusion protocol. S/p emergent TIPS on 6/4. Isolated fever on 6/6, prerenal AKI, and central venous O2 nearly 90 concerning for some component of septic shock, though infectious workup remains unremarkable, and central venous O2 may be confounded in  the setting of cirrhosis. Norepi weaned 6/14, but then increased again ON into 6/15. She has no new sequelae of bleeding, concern at this time is for possible aspiration that we are covering with Zosyn .  - Holding midodrine  to 10mg  q6  - Titrate norepinephrine  for SBP > 100  - SLP eval  - Abx: Zosyn     Stress cardiomyopathy - Demand Ischemia   With elevated troponin on admission (5.4k) likely 2/2 demand ischemia in the setting of shock. Echo on admission overall normal with EF > 55%. Developed several episodes of wide complex tachycardia on 6/5. Bedside POCUS with apical ballooning and slightly decreased EF to 45-50%. EKG was without acute ischemic changes. Troponin peaked at 23k (6/6). Formal TTE demonstrated reduced EF to 45% with basal and midventricular hypokinesis. Findings favored to represent stress cardiomyopathy rather than ACS in the absence of acute EKG changes. Repeat TTE 6/10 with EF 40%, marginally decreased from prior.   - Cardiology consulted, appreciate recs   - Low concern for true ACS, no need for heparinization or ischemic eval acutely    Wide complex tachycardia - Hx ventricular tachycardia  Developed VT arrest requiring shock at OSH with initial TIPS attempt. On 6/5 developed several episodes of wide complex tachycardia. EKG with tachycardia to 130s, thought by cardiology to represent a fib with abberancy rather than VT. She was stabilized with amiodarone  bolus, followed by amiodarone  gtt and then PO amio. Finished amio load 6/12, no role for amio moving forward as this was precipitated iso critical illness.   - Cardiology consulted, appreciate recs   - Off amiodarone  gtt   - S/p po amiodarone  load  - q8h BMP  - Replete K>4, Mg>2    CAD, prior NSTEMI  Prior NSTEMI per chart review. No LHC data available. Unclear if she has history of coronary stent.   - Home aspirin  held ISO bleeding    Renal   Prerenal AKI, c/f HRS (resolved)  Cr elevated to 1.32 from baseline ~0.8. Urine lytes most consistent with prerenal etiology, 2/2 hypoperfusion iso shock, unlikely HRS. Cr slowly improving, now with increasing urine output.   - Management of contributing etiologies as above  - q8h BMP     Infectious Disease/Autoimmune   Undifferentiated shock, c/f septic shock - Fever (resolved) - Leukocytosis (resolved)  Presented to [facility] MICU in 2-pressor shock with leukocytosis to 24.9. UA noninfectious, CXR without pneumonia. Bcx NGTD x 24 hours. Most likely infectious source intra-abdominal. SBP considered, unfortunately ascites from paracentesis on 6/4 were not sent for diagnostic studies. No skin findings on exam to suggest SSTI. Femoral line placed at OSH possibly an infectious source, though Bcx have been NGTD. With fever to 38.4 on 6/6. Paracentesis with PMNs < 250. Thoracentesis transudative. UA noninfectious. Lrcx gram stain with 1+ yeast, 1+ GPC. Fluctuating single pressor requirement since 6/9, repeat infectious workup negative thus far; had possible aspiration event overnight into 6/15 for which Zosyn  was resumed.   - Bcx ngtd, fu repeat cultures  - Fu repeat LRCx  - Zosyn     Cultures:  Blood Culture, Routine (no units)   Date Value   05/06/2024 No Growth at 5 days   05/06/2024 No Growth at 5 days     Urine Culture, Comprehensive (no units)   Date Value   05/04/2024 NO GROWTH     Lower Respiratory Culture (no units)   Date Value   05/07/2024 OROPHARYNGEAL FLORA ISOLATED   05/06/2024 Specimen Not Processed     WBC (  10*9/L)   Date Value   05/15/2024 17.8 (H)     WBC, UA (/HPF)   Date Value   05/10/2024 25 (H)          FEN/GI   Decompensated cirrhosis c/b esophageal varices, gastric varix, hepatic encephalopathy, ascites   Presenting with hematemesis 6 days after esophageal banding. Emergent TIPS at OSH not successful d/t inability to access the portal vein. EGD on 6/4 demonstrated nonbleeding esophageal ulcers at site of prior banding, stomach completely occluded by blood which could not be cleared endoscopically. With acute decline in Hb and hypotension on 6/4. Darius Edouard attempted with GI, but unsuccessful after numerous attempts. S/p salvage TIPS on 6/4 with no further GI bleeding.   - Hepatology following  - Daily MELLD labs (CMP, PT/INR)  - Maintain fibrinogen > 100 with cryo  - Home lasix , spironolactone  held d/t hypotension  - Continue rifaximin , continue lactulose  20g BID for goal 3-5 Bms daily    Acute on chronic liver failure - Ischemic hepatitis (improving)  LFTs continue to rise s/p variceal hemorrhage and TIPS. Unfortunately, precluded from transplant eval with newly reduced EF. LFTs slowly improving, suggesting ongoing recovery from TIPS and hemorrhagic event.  - Hepatology following    Hypernatremia  - Adjust FWF as needed to maintain Na 135-145    Malnutrition Assessment:   Tube feeds started 6/9. Post-extubation speech eval with recommendation for NPO, ice chips only. With improving bedside swallow eval by nursing 6/14, will advance diet as tolerate.   - Trickle feeds with FWF  - Trial oral diet       Heme/Coag   Blood loss anemia, likely 2/2 refractory portal hypertensive bleeding (stable)  S/p 3 units pRBCs at OSH, Hb 9.3 on arrival to MICU. S/p massive transfusion protocol on 6/4. Low clinical concern for ongoing blood loss with stable Hb, last transfusion 6/4.   - q8h CBC with diff    Mild Thrombocytopenia (stable)  Plts 253k on admission, acute decline to 50k on 6/5. Currently without signs of active bleeding. Plts remain stable around ~100k.  - q8h CBC with diff    Endocrine   Insulin -dependent T2DM  A1c 5.6 on admission. Home regimen lantus  60, lispro with meals. BG control challenging in the setting of critical illness and stress dose steroids. S/p insulin  infusion, converted to NPH 6/8. Ongoing hyperglycemia in the setting of continuous tube feeds, titrating insulin . Anticipate decline in insulin  requirements with transition from continuous tube feeds to regular diet.   - NPH 60 units BID  - Regular insulin  15 units QID  - Regular SSI QID    Integumentary   Psoriasis (stable)  History of psoriasis per chart review. With numerous chronic appearing scattered erythematous plaques with silvery scale. Of note, on Secukinumab  for psoriasis management, which can increase risk for infection.    # Irritant contact dermatitis (buttocks)  # Wound, R upper anterior leg  # Irritant contact dermatitis (anterior pelvis)  # Intertrigo along pannus  # Scalp wound 2/2 psoriasis  - WOCN consulted for high risk skin assessment Yes.  - Recommendations per WOCN note:  WOCN Recommendations:   - See nursing orders for wound care instructions.  - Contact WOCN with questions, concerns, or wound deterioration.  Topical Therapy/Interventions:   - Absorbent Dressing  - Antimicrobial Solutions  - Crusting (stoma powder or antifungal powder)  - Hydrofiber  - Zinc oxide barrier cream      Prophylaxis/LDA/Restraints/Consults   ICU checklist completed: yes (see ICU rounding navigator  in Epic)    Patient Lines/Drains/Airways Status       Active Active Lines, Drains, & Airways       Name Placement date Placement time Site Days    Open Drain Inferior;Lateral;Right Back 05/14/24  1357  Back  less than 1    Urethral Catheter Temperature probe 05/13/24  1110  Temperature probe  1    Peripheral IV 05/04/24 Anterior;Left;Upper Arm 05/04/24  0500  Arm  11    Peripheral IV 05/04/24 Anterior;Right;Upper Arm 05/04/24  0500  Arm  11    Arterial Line 05/10/24 Right Radial 05/10/24  1845  Radial  4                  Patient Lines/Drains/Airways Status       Active Wounds       Name Placement date Placement time Site Days    Surgical Site 05/04/24 Abdomen Lower;Right 05/04/24  2300  -- 10    Surgical Site 05/04/24 Neck Right 05/04/24  2300  -- 10    Surgical Site 05/04/24 Groin Right 05/04/24  2300  -- 10    Wound 05/12/24 Irritant Contact Dermatitis Rash/Dermatitis Buttocks 05/12/24  1220  Buttocks  2    Wound 05/12/24 Other (Comment) Leg Anterior;Right;Upper 05/12/24  1225  Leg  2    Wound 05/12/24 Irritant Contact Dermatitis Perspiration Pelvis Anterior;Right Intertrigo along pannus 05/12/24  1226  Pelvis  2    Wound 05/12/24 Other (Comment) Head Posterior;Upper 05/12/24  1227  Head  2                    Goals of Care     Code Status: Full Code    Designated Healthcare Decision Maker:  Ms. Dadamo current decisional capacity for healthcare decision-making is Full capacity. Her designated healthcare decision maker(s) is/are   HCDM (patient stated preference): Caudle,Ryan - Relative - [phone].      Subjective     Patient without acute concern this AM. Feeling well, breathing feels well. When asked, does say that she has some difficulty breathing when she eats.    Objective     Vitals - past 24 hours  Temp:  [36.7 ??C (98.1 ??F)-36.9 ??C (98.4 ??F)] 36.8 ??C (98.2 ??F)  Pulse:  [67-95] 78  SpO2 Pulse:  [68-95] 78  Resp:  [13-24] 16  BP: (89-92)/(29-30) 92/29  SpO2:  [95 %-99 %] 96 % Intake/Output  I/O last 3 completed shifts:  In: 7749.1 [P.O.:2197; I.V.:68.5; NG/GT:4890; IV Piggyback:593.6]  Out: 2956 [OZHYQ:6578; Drains:80]     Physical Exam:    General: Ill-appearing female, lying in bed.   HEENT: Mucous membranes dry. Sclerae icteric.   CV: RRR  Pulm: CTAB on anterior exam.  GI: Normoactive bowel sounds. Soft, nondistended. RUQ and epigastrium nontender to deep palpation.  Extremities: Trace edema BLE.   Skin: Diffuse scattered erythematous plaques with silvery scale. No bruising, petechiae, mucocutaneous bleeding.  Neuro: Alert and oriented to self and in part to situation, disoriented to time and place.     Continuous Infusions:    NORepinephrine  bitartrate-NS 4 mcg/min (05/15/24 0555)       Scheduled Medications:    clobetasol    Topical BID    enoxaparin  (LOVENOX ) injection  40 mg Subcutaneous Q24H    esomeprazole   40 mg Enteral tube: gastric BID    insulin  NPH  24 Units Subcutaneous Q12H    insulin  regular  0-20 Units Subcutaneous Q6H SCH    [Provider Hold]  insulin  regular  15 Units Subcutaneous Q6H    lactulose   20 g Enteral tube: gastric BID    levETIRAcetam   750 mg Enteral tube: gastric BID    midodrine   10 mg Oral Q6H    multivitamins (ADULT)  1 tablet Enteral tube: gastric Daily    piperacillin -tazobactam (ZOSYN ) IV (intermittent)  4.5 g Intravenous Q6H    sodium chloride   10 mL Intravenous Q8H    sodium chloride   10 mL Intravenous Q8H    sodium chloride   10 mL Intravenous Q8H    ursodiol  300 mg Enteral tube: gastric 2xd PC       PRN medications:  dextrose  in water , glucagon, glucose, hydrOXYzine, melatonin    Data/Imaging Review: Reviewed in Epic and personally interpreted on 05/15/2024. See EMR for detailed results.

## 2024-05-15 NOTE — Unmapped (Signed)
 Speech Language Pathology Clinical Swallow Assessment  Re-Evaluation (05/15/24 1457)    Patient Name:  Ashley Briggs       Medical Record Number: [id number]   Date of Birth: 1966/02/24  Sex: Female            SLP Treatment Diagnosis:  (r/o dysphagia)     Activity Tolerance: Patient tolerated treatment well    Assessment  Pt seen today for repeat clinical swallowing evaluation. Encountered upright in bed, awake/alert, agreeable to session. Several family members at bedside. NGT removed since last ST session. Pt currently presents with clinical evidence of an oropharyngeal swallowing mechanism that is [facility] Oral mech WFL. Pt self fed trials of thin liquids, puree, and solids without overt frank clinical s/sx aspiration. Voice remained clear/strong and unchanged following PO. Denied sensation of pharyngeal residual and odynophagia. Denied prior history of dysphagia.     Recommend regular solid diet and thin liquids as tolerated. General aspiration precautions apply: upright with intake, small and single bites/sips, slow rate, alternate solids and liquids. No further acute skilled ST services warranted at this time. Thank you for this consult.    Risk for Aspiration: Mild     Recommendations:  PO Diet           Diet Liquids Recommendations: Thin Liquids, Level 0, No Restrictions    Diet Solids Recommendation: Regular Consistency Solids    Recommended Form of Medications: Whole, Crushed, With puree, With liquid (as pt prefers and as medically indicated. crushed in puree may ease oropharyngeal transport)      Recommended Compensatory Techniques : Slow rate, Small sips/bites, Upright 90 degrees    Post Acute Discharge Recommendations  Post Acute SLP Discharge Recommendations: Skilled SLP services are NOT indicated    Prognosis: Good  Positive Indicators: + alert, tolerating extubation, clinical swallow performance  Barriers to Discharge: Severity of deficits, Functional strength deficits, Impaired Balance, Gait instability, Cognitive deficits, Endurance deficits, Inability to safely perform ADLS     Plan of Care  SLP Daily Frequency: D/C Services, D/C Services       Treatment Goals:  Short Term Goal 1: Pt will tolerate least restrictive diet without clinical s/sx of aspiration   Time Frame : 2 weeks     Patient and Family Goal: to keep getting better    Subjective  Medical Updates Since Last Visit/Relevant PMH Affecting Clinical Decision Making: Remains in MICU. Seen today for repeat clinical swallowing evaluation.    Communication Preference: Verbal  Patient/Caregiver Reports: Pt denied dysphagia PTA  Pain: no s/s of pain     Allergies: Patient has no known allergies.  Current Medications[1]  Past Medical History[2]  Family History[3]  Past Surgical History[4]  Social History     Tobacco Use    Smoking status: Never     Passive exposure: Current    Smokeless tobacco: Never   Substance Use Topics    Alcohol use: Not Currently     Comment: No longer         General:  Hearing Exceptions: None                 Self-Feeding Capacity: Functional for self-feeding                   Medical Tests / Procedures Comments: CXR 6/14: Shifting pulmonary edema.    Unchanged small left pleural effusion with adjacent atelectasis.    Lines and tubes as described.  Precautions / Restrictions  Precautions: Falls precautions, Aspiration precautions  Weight Bearing Status: Non-applicable  Required Braces or Orthoses: Non-applicable    Objective  Temperature Spikes Noted: No  Respiratory Status : Room air  History of Intubation: Yes  Length of Intubations (days): 8 days  Date extubated: 05/11/24    Behavior/Cognition: Alert, Cooperative, Pleasant mood  Positioning : Upright in bed    Oral / Motor Exam  Vocal Quality: Normal  Volitional Swallow: Within Functional Limits   Labial ROM: Within Functional Limits   Labial Symmetry: Within Functional Limits  Labial Strength: Within Functional Limits   Lingual ROM: Within Functional Limits  Lingual Symmetry: Within Functional Limits  Lingual Strength: Within Functional Limits      Velum: elevation grossly WNL   Mandible: Within Functional Limits     Facial ROM: Within Functional Limits   Facial Symmetry: Within Functional Limits  Facial Strength: Within Functional Limits  Facial Sensation: Within Functional Limits   Vocal Intensity: Within Functional Limits       Apraxia: None present   Dysarthria: None present   Intelligibility: Intelligible   Breath Support: Adequate for speech   Dentition: Adequate, Some missing teeth    Consistencies assessed: thin liquids, puree, solids    Patient at end of session: All needs in reach, In bed, Notified Nurse, Friends/Family present    Speech Therapy Session Duration  SLP Individual [mins]: 10    I attest that I have reviewed the above information.  Signed: Fatima Honour, SLP    Filed 05/15/2024         [1]   Current Facility-Administered Medications   Medication Dose Route Frequency Provider Last Rate Last Admin    clobetasol  (TEMOVATE ) 0.05 % ointment   Topical BID Rexine Cater, MD   Given at 05/15/24 0809    dextrose  50 % in water  (D50W) 50 % solution 12.5 g  12.5 g Intravenous Q15 Min PRN Dillon Frames, MD        enoxaparin  (LOVENOX ) syringe 40 mg  40 mg Subcutaneous Q24H Sperling, Gabriel, MD   40 mg at 05/15/24 0809    esomeprazole  (NEXIUM ) granules 40 mg  40 mg Enteral tube: gastric BID Sperling, Gabriel, MD   40 mg at 05/15/24 0809    glucagon injection 1 mg  1 mg Intramuscular Once PRN Taylor, Savannah J, MD        glucose chewable tablet 16 g  16 g Oral Q10 Min PRN Taylor, Savannah J, MD        hydrOXYzine (ATARAX) tablet 25 mg  25 mg Oral Q6H PRN McDaniel, Kyle E, MD   25 mg at 05/13/24 0138    [Provider Hold] insulin  NPH (HumuLIN ,NovoLIN ) injection 24 Units  24 Units Subcutaneous Q12H Rexine Cater, MD   24 Units at 05/15/24 0510    insulin  regular (HumuLIN ,NovoLIN ) inj CORRECTIONAL 0-20 Units  0-20 Units Subcutaneous Q6H [facility] Stephenson, Connor D, MD   4 Units at 05/15/24 1119    [Provider Hold] insulin  regular (HumuLIN ,NovoLIN ) injection 15 Units  15 Units Subcutaneous Q6H Resweber, Tillie Folk, MD   15 Units at 05/14/24 1308    lactulose  oral solution  20 g Enteral tube: gastric BID Rexine Cater, MD   20 g at 05/15/24 0809    levETIRAcetam  (KEPPRA ) oral solution  750 mg Enteral tube: gastric BID Sperling, Gabriel, MD   750 mg at 05/15/24 0809    melatonin tablet 3 mg  3 mg Oral Nightly PRN McDaniel, Kyle E, MD  3 mg at 05/13/24 2355    [Provider Hold] midodrine  (PROAMATINE ) tablet 10 mg  10 mg Oral Q6H McDaniel, Kyle E, MD   10 mg at 05/15/24 0809    multivitamins, therapeutic with minerals tablet 1 tablet  1 tablet Enteral tube: gastric Daily Rexine Cater, MD   1 tablet at 05/15/24 0809    NORepinephrine  8 mg in dextrose  5 % 250 mL (32 mcg/mL) infusion PMB  0-30 mcg/min Intravenous Continuous Brenita Callow, MD 11.3 mL/hr at 05/15/24 0940 6 mcg/min at 05/15/24 0940    piperacillin -tazobactam (ZOSYN ) IVPB (premix) 4.5 g  4.5 g Intravenous Q6H McDaniel, Kyle E, MD   Stopped at 05/15/24 1304    sodium chloride  (NS) 0.9 % flush 10 mL  10 mL Intravenous Q8H Macarthur Savory, MD   10 mL at 05/14/24 1235    sodium chloride  (NS) 0.9 % flush 10 mL  10 mL Intravenous Q8H Macarthur Savory, MD   10 mL at 05/14/24 1235    sodium chloride  (NS) 0.9 % flush 10 mL  10 mL Intravenous Q8H Macarthur Savory, MD   10 mL at 05/14/24 1235    ursodiol (ACTIGALL) oral suspension  300 mg Enteral tube: gastric 2xd PC McDaniel, Kyle E, MD   300 mg at 05/15/24 0809     Facility-Administered Medications Ordered in Other Encounters   Medication Dose Route Frequency Provider Last Rate Last Admin    phenylephrine  1 mg/10 mL (100 mcg/mL) injection Syrg   Intravenous PRN one-step-med Davis, Jacob C, MD   100 mcg at 05/05/24 1805    Propofol  (DIPRIVAN ) injection   Intravenous PRN one-step-med Davis, Jacob C, MD   40 mg at 05/05/24 1800    ROCuronium  (ZEMURON ) injection   Intravenous PRN one-step-med Davis, Jacob C, MD   100 mg at 05/05/24 1800   [2]   Past Medical History:  Diagnosis Date    Anemia     Angular blepharoconjunctivitis of both eyes 12/21/2020    Anxiety     Arthritis     Ascites     Blepharitis     Calcified cerebral meningioma    08/16/2022    Cataract associated with type 2 diabetes mellitus    12/21/2020    Chronic headaches     Chronic pain     Chronic pain disorder 2029    I have multiple issues. Seeing doctor's regarding  issues    Cirrhosis        Coronary artery disease involving native heart without angina pectoris 02/05/2021    Current moderate episode of major depressive disorder (CMS-HCC) 11/06/2022    Diabetes mellitus        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 type 2 diabetes mellitus    12/02/2022    Dry eye syndrome, bilateral 12/21/2020    Dysphagia 02/02/2023    Endometrial cancer    2007    Esophageal varices in cirrhosis    06/17/2022    Fractures 01/29/2018    Gastroparesis     Generalized pruritus 02/02/2023    GERD (gastroesophageal reflux disease) It's  been going on for years    I take medication  which helps tremendously    Glaucoma 2023    I see a doctor on a regular basis    H/O diabetic gastroparesis 01/09/2021    Heart disease     Hemorrhage of rectum and anus 03/17/2011    Hepatic cirrhosis  06/17/2022    Hepatic encephalopathy    06/17/2022    Hepatic steatosis 03/13/2021    Noted on CT Fall 2021    High cholesterol     History of encephalopathy     Hx of psoriasis 08/06/2022    Hyperlipidemia 12/12/2015    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    Liver disease     Major depression     Metabolic dysfunction-associated steatohepatitis (MASH)     Morbid obesity (CMS-HCC) 11/10/2019    Myopia of both eyes with astigmatism and presbyopia 12/02/2022    Nausea 09/22/2022    Non-alcoholic fatty liver disease     NPDR (nonproliferative diabetic retinopathy) Right eye    Other ascites 09/22/2022    Other chronic pain 11/06/2022    Other insomnia 11/06/2022    PFD (pelvic floor dysfunction) 04/07/2013    Portal hypertension    10/08/2022    Psoriasis     Ptosis of eyelid, left     Left upper eyelid    Reflux     Retinopathy of left eye, background, proliferative 01/09/2021    Right upper quadrant abdominal pain 09/22/2022    Secondary esophageal varices without bleeding    06/17/2022    Type 2 diabetes mellitus, with long-term current use of insulin     12/12/2015    Last Assessment & Plan:   The current medical regimen is effective;  continue present plan and medications.  Patient will continue good management of diabetes especially with weight loss. Hopefully with further weight loss will run into the problem of being overmedicated and having to cut back on medications     Some confusion on diabetes medications was have not.been refilled for over a year.  Pa   [3]   Family History  Problem Relation Age of Onset    Diabetes Mother     Cancer Mother         Complications during dialysis    Depression Mother     Hypertension Mother     Vision loss Mother     No Known Problems Father     No Known Problems Brother     Diabetes Maternal Grandmother     Hypertension Maternal Grandmother     Alcohol abuse Maternal Grandmother         Heavy drinker    Arthritis Maternal Grandmother     Cancer Maternal Grandfather     Diabetes Maternal Grandfather     Alcohol abuse Maternal Grandfather     Heart disease Maternal Grandfather     No Known Problems Paternal Grandmother     Skin cancer Paternal Grandfather     Stroke Paternal Grandfather     Vision loss Maternal Aunt         Heart Attack    No Known Problems Maternal Uncle     No Known Problems Paternal Aunt     No Known Problems Paternal Uncle     No Known Problems Other     Melanoma Neg Hx     Basal cell carcinoma Neg Hx     Squamous cell carcinoma Neg Hx     Substance Abuse Disorder Neg Hx     Drug abuse Neg Hx     Mental illness Neg Hx     Amblyopia Neg Hx     Blindness Neg Hx     Cataracts Neg Hx     Glaucoma Neg Hx     Macular degeneration  Neg Hx     Retinal detachment Neg Hx     Strabismus Neg Hx     Thyroid disease Neg Hx     Breast cancer Neg Hx    [4]   Past Surgical History:  Procedure Laterality Date    CHOLECYSTECTOMY      HYSTERECTOMY      endometrial cancer    HYSTERECTOMY      IR TIPS  05/04/2024    IR TIPS 05/04/2024 [facility]    OOPHORECTOMY      OTHER SURGICAL HISTORY  Hysterectomy was in 2007. Gallbladder was in 1999 and phrenectomy  was in 2024    Hysterectomy,  gallbladder removed and phrenectomy    PR ANAL PRESSURE RECORD Left 03/31/2013    Procedure: ANORECTAL MANOMETRY;  Surgeon: None None;  [facility];  Service: Gastroenterology    PR BREATH HYDROGEN TEST N/A 03/31/2013    Procedure: BREATH HYDROGEN TEST;  Surgeon: None None;  [facility];  Service: Gastroenterology    PR COLSC FLX W/RMVL OF TUMOR POLYP LESION SNARE TQ N/A 09/06/2020    Procedure: COLONOSCOPY FLEX; W/REMOV TUMOR/LES BY SNARE;  Surgeon: Benedetto Brady, MD;  [facility];  Service: Gastroenterology    PR UPPER GI ENDOSCOPY,BIOPSY N/A 04/15/2013    Procedure: UGI ENDOSCOPY; WITH BIOPSY, SINGLE OR MULTIPLE;  Surgeon: Allene Ards, MD;  [facility];  Service: Gastroenterology    PR UPPER GI ENDOSCOPY,BIOPSY N/A 09/06/2020    Procedure: UGI ENDOSCOPY; WITH BIOPSY, SINGLE OR MULTIPLE;  Surgeon: Benedetto Brady, MD;  [facility];  Service: Gastroenterology    PR UPPER GI ENDOSCOPY,BIOPSY N/A 05/05/2022    Procedure: UGI ENDOSCOPY; WITH BIOPSY, SINGLE OR MULTIPLE;  Surgeon: Benedetto Brady, MD;  [facility];  Service: Gastroenterology    PR UPPER GI ENDOSCOPY,BIOPSY N/A 01/22/2023    Procedure: UGI ENDOSCOPY; WITH BIOPSY, SINGLE OR MULTIPLE;  Surgeon: Celestia Colander, MD;  [facility];  Service: Gastroenterology

## 2024-05-16 LAB — BLOOD GAS, ARTERIAL
BASE EXCESS ARTERIAL: -2.5 — ABNORMAL LOW (ref -2.0–2.0)
HCO3 ARTERIAL: 21 mmol/L — ABNORMAL LOW (ref 22–27)
O2 SATURATION ARTERIAL: 96 % (ref 94.0–100.0)
PCO2 ARTERIAL: 27.3 mmHg — ABNORMAL LOW (ref 35.0–45.0)
PH ARTERIAL: 7.49 — ABNORMAL HIGH (ref 7.35–7.45)
PO2 ARTERIAL: 73.7 mmHg — ABNORMAL LOW (ref 80.0–110.0)

## 2024-05-16 LAB — CBC W/ AUTO DIFF
BASOPHILS ABSOLUTE COUNT: 0.1 10*9/L (ref 0.0–0.1)
BASOPHILS ABSOLUTE COUNT: 0.1 10*9/L (ref 0.0–0.1)
BASOPHILS RELATIVE PERCENT: 0.5 %
BASOPHILS RELATIVE PERCENT: 0.5 %
EOSINOPHILS ABSOLUTE COUNT: 0.2 10*9/L (ref 0.0–0.5)
EOSINOPHILS ABSOLUTE COUNT: 0.2 10*9/L (ref 0.0–0.5)
EOSINOPHILS RELATIVE PERCENT: 1.3 %
EOSINOPHILS RELATIVE PERCENT: 1.6 %
HEMATOCRIT: 27.2 % — ABNORMAL LOW (ref 34.0–44.0)
HEMATOCRIT: 28.1 % — ABNORMAL LOW (ref 34.0–44.0)
HEMOGLOBIN: 9.2 g/dL — ABNORMAL LOW (ref 11.3–14.9)
HEMOGLOBIN: 9.3 g/dL — ABNORMAL LOW (ref 11.3–14.9)
LYMPHOCYTES ABSOLUTE COUNT: 1.1 10*9/L (ref 1.1–3.6)
LYMPHOCYTES ABSOLUTE COUNT: 0.9 10*9/L — ABNORMAL LOW (ref 1.1–3.6)
LYMPHOCYTES RELATIVE PERCENT: 7.5 %
LYMPHOCYTES RELATIVE PERCENT: 6.3 %
MEAN CORPUSCULAR HEMOGLOBIN CONC: 33.6 g/dL (ref 32.0–36.0)
MEAN CORPUSCULAR HEMOGLOBIN CONC: 32.9 g/dL (ref 32.0–36.0)
MEAN CORPUSCULAR HEMOGLOBIN: 29.7 pg (ref 25.9–32.4)
MEAN CORPUSCULAR HEMOGLOBIN: 29.5 pg (ref 25.9–32.4)
MEAN CORPUSCULAR VOLUME: 88.2 fL (ref 77.6–95.7)
MEAN CORPUSCULAR VOLUME: 89.7 fL (ref 77.6–95.7)
MEAN PLATELET VOLUME: 9.4 fL (ref 6.8–10.7)
MEAN PLATELET VOLUME: 9.4 fL (ref 6.8–10.7)
MONOCYTES ABSOLUTE COUNT: 1.4 10*9/L — ABNORMAL HIGH (ref 0.3–0.8)
MONOCYTES ABSOLUTE COUNT: 1.5 10*9/L — ABNORMAL HIGH (ref 0.3–0.8)
MONOCYTES RELATIVE PERCENT: 9.9 %
MONOCYTES RELATIVE PERCENT: 11 %
NEUTROPHILS ABSOLUTE COUNT: 11.8 10*9/L — ABNORMAL HIGH (ref 1.8–7.8)
NEUTROPHILS ABSOLUTE COUNT: 11.1 10*9/L — ABNORMAL HIGH (ref 1.8–7.8)
NEUTROPHILS RELATIVE PERCENT: 80.8 %
NEUTROPHILS RELATIVE PERCENT: 80.6 %
PLATELET COUNT: 152 10*9/L (ref 150–450)
PLATELET COUNT: 163 10*9/L (ref 150–450)
RED BLOOD CELL COUNT: 3.09 10*12/L — ABNORMAL LOW (ref 3.95–5.13)
RED BLOOD CELL COUNT: 3.14 10*12/L — ABNORMAL LOW (ref 3.95–5.13)
RED CELL DISTRIBUTION WIDTH: 19.8 % — ABNORMAL HIGH (ref 12.2–15.2)
RED CELL DISTRIBUTION WIDTH: 19.9 % — ABNORMAL HIGH (ref 12.2–15.2)
WBC ADJUSTED: 14.6 10*9/L — ABNORMAL HIGH (ref 3.6–11.2)
WBC ADJUSTED: 13.7 10*9/L — ABNORMAL HIGH (ref 3.6–11.2)

## 2024-05-16 LAB — COMPREHENSIVE METABOLIC PANEL
ALBUMIN: 1.9 g/dL — ABNORMAL LOW (ref 3.4–5.0)
ALBUMIN: 2 g/dL — ABNORMAL LOW (ref 3.4–5.0)
ALKALINE PHOSPHATASE: 112 U/L (ref 46–116)
ALKALINE PHOSPHATASE: 119 U/L — ABNORMAL HIGH (ref 46–116)
ALT (SGPT): 69 U/L — ABNORMAL HIGH (ref 10–49)
ALT (SGPT): 64 U/L — ABNORMAL HIGH (ref 10–49)
ANION GAP: 10 mmol/L (ref 5–14)
ANION GAP: 9 mmol/L (ref 5–14)
AST (SGOT): 55 U/L — ABNORMAL HIGH (ref <=34)
AST (SGOT): 58 U/L — ABNORMAL HIGH (ref <=34)
BILIRUBIN TOTAL: 15.2 mg/dL — ABNORMAL HIGH (ref 0.3–1.2)
BILIRUBIN TOTAL: 16.4 mg/dL — ABNORMAL HIGH (ref 0.3–1.2)
BLOOD UREA NITROGEN: 37 mg/dL — ABNORMAL HIGH (ref 9–23)
BLOOD UREA NITROGEN: 35 mg/dL — ABNORMAL HIGH (ref 9–23)
BUN / CREAT RATIO: 46
BUN / CREAT RATIO: 41
CALCIUM: 8.4 mg/dL — ABNORMAL LOW (ref 8.7–10.4)
CALCIUM: 8.2 mg/dL — ABNORMAL LOW (ref 8.7–10.4)
CHLORIDE: 102 mmol/L (ref 98–107)
CHLORIDE: 101 mmol/L (ref 98–107)
CO2: 22 mmol/L (ref 20.0–31.0)
CO2: 21 mmol/L (ref 20.0–31.0)
CREATININE: 0.81 mg/dL (ref 0.55–1.02)
CREATININE: 0.85 mg/dL (ref 0.55–1.02)
EGFR CKD-EPI (2021) FEMALE: 84 mL/min/1.73m2 (ref >=60)
EGFR CKD-EPI (2021) FEMALE: 79 mL/min/1.73m2 (ref >=60)
GLUCOSE RANDOM: 248 mg/dL — ABNORMAL HIGH (ref 70–179)
GLUCOSE RANDOM: 284 mg/dL — ABNORMAL HIGH (ref 70–179)
POTASSIUM: 4 mmol/L (ref 3.4–4.8)
POTASSIUM: 3.9 mmol/L (ref 3.4–4.8)
PROTEIN TOTAL: 5.8 g/dL (ref 5.7–8.2)
PROTEIN TOTAL: 6 g/dL (ref 5.7–8.2)
SODIUM: 134 mmol/L — ABNORMAL LOW (ref 135–145)
SODIUM: 131 mmol/L — ABNORMAL LOW (ref 135–145)

## 2024-05-16 LAB — PROTIME-INR
INR: 1.54
PROTIME: 17.5 s — ABNORMAL HIGH (ref 9.9–12.6)

## 2024-05-16 LAB — PHOSPHORUS
PHOSPHORUS: 2.9 mg/dL (ref 2.4–5.1)
PHOSPHORUS: 3 mg/dL (ref 2.4–5.1)

## 2024-05-16 LAB — MAGNESIUM
MAGNESIUM: 2.1 mg/dL (ref 1.6–2.6)
MAGNESIUM: 1.9 mg/dL (ref 1.6–2.6)

## 2024-05-16 MED ADMIN — clobetasol (TEMOVATE) 0.05 % ointment: TOPICAL | @ 01:00:00

## 2024-05-16 MED ADMIN — enoxaparin (LOVENOX) syringe 40 mg: 40 mg | SUBCUTANEOUS | @ 12:00:00

## 2024-05-16 MED ADMIN — midodrine (PROAMATINE) tablet 5 mg: 5 mg | ORAL | @ 16:00:00 | Stop: 2024-05-16

## 2024-05-16 MED ADMIN — levETIRAcetam (KEPPRA) oral solution: 750 mg | GASTROENTERAL | @ 01:00:00

## 2024-05-16 MED ADMIN — piperacillin-tazobactam (ZOSYN) IVPB (premix) 4.5 g: 4.5 g | INTRAVENOUS | @ 06:00:00 | Stop: 2024-05-16

## 2024-05-16 MED ADMIN — insulin regular (HumuLIN,NovoLIN) inj CORRECTIONAL 0-20 Units: 0-20 [IU] | SUBCUTANEOUS | @ 04:00:00

## 2024-05-16 MED ADMIN — NORepinephrine 8 mg in dextrose 5 % 250 mL (32 mcg/mL) infusion PMB: 0-30 ug/min | INTRAVENOUS | @ 06:00:00 | Stop: 2024-05-16

## 2024-05-16 MED ADMIN — insulin regular (HumuLIN,NovoLIN) inj CORRECTIONAL 0-20 Units: 0-20 [IU] | SUBCUTANEOUS | @ 11:00:00 | Stop: 2024-05-16

## 2024-05-16 MED ADMIN — multivitamins, therapeutic with minerals tablet 1 tablet: 1 | GASTROENTERAL | @ 12:00:00

## 2024-05-16 MED ADMIN — piperacillin-tazobactam (ZOSYN) IVPB (premix) 4.5 g: 4.5 g | INTRAVENOUS | @ 12:00:00 | Stop: 2024-05-16

## 2024-05-16 MED ADMIN — ursodiol (ACTIGALL) oral suspension: 300 mg | GASTROENTERAL | @ 12:00:00

## 2024-05-16 MED ADMIN — insulin lispro (HumaLOG) injection CORRECTIONAL 0-20 Units: 0-20 [IU] | SUBCUTANEOUS | @ 21:00:00

## 2024-05-16 MED ADMIN — ursodiol (ACTIGALL) oral suspension: 300 mg | GASTROENTERAL | @ 22:00:00

## 2024-05-16 MED ADMIN — insulin NPH (HumuLIN,NovoLIN) injection 24 Units: 24 [IU] | SUBCUTANEOUS | @ 21:00:00

## 2024-05-16 MED ADMIN — midodrine (PROAMATINE) tablet 10 mg: 10 mg | ORAL | @ 21:00:00

## 2024-05-16 MED ADMIN — esomeprazole (NEXIUM) granules 40 mg: 40 mg | GASTROENTERAL | @ 01:00:00

## 2024-05-16 MED ADMIN — levETIRAcetam (KEPPRA) oral solution: 750 mg | GASTROENTERAL | @ 12:00:00

## 2024-05-16 MED ADMIN — piperacillin-tazobactam (ZOSYN) IVPB (premix) 4.5 g: 4.5 g | INTRAVENOUS | @ 18:00:00 | Stop: 2024-05-21

## 2024-05-16 MED ADMIN — esomeprazole (NEXIUM) granules 40 mg: 40 mg | GASTROENTERAL | @ 12:00:00

## 2024-05-16 MED ADMIN — clobetasol (TEMOVATE) 0.05 % ointment: TOPICAL | @ 12:00:00

## 2024-05-16 NOTE — Unmapped (Signed)
 MICU Daily Progress Note     Date of Service: 05/16/2024    Problem List:   Principal Problem:    Shock     Active Problems:    Type 2 diabetes mellitus, with long-term current use of insulin        Hyperlipidemia    Hypertension    Coronary artery disease involving native heart without angina pectoris    Diabetes mellitus       Esophageal varices in cirrhosis       Hepatic encephalopathy       Calcified cerebral meningioma       Portal hypertension       Other chronic pain    OSA (obstructive sleep apnea)    Stage 3a chronic kidney disease (CMS-HCC)    Hematemesis      Interval history: Ashley Briggs is a 58 y.o. female with PMH CAD s/p NSTEMI, CKD, NAS cirrhosis c/b ascites, hepatic encephalopathy and EV s/p recent banding admitted with UGI bleed 2/2 bleeding esophageal ulcers s/p unsusccessful TIPS c/b pulsatile VT s/p shock requiring transfer to [facility] ICU. Course complicated by severe hemorrhagic shock 2/2 gastric bleed now s/p TIPS with course complicated by acute hypoxic respiratory failure, AKI, stress cardiomyopathy and hepatic encephalopathy. Hgb stable. Extubated 6/12 and weaned to RA. Remains in the MICU for pressor requirement.    24hr events:   - SLP eval recommending p.o. diet  -Titrating insulin  regimen  -Continues on Zosyn  for possible aspiration  -Remains on norepinephrine  5-8  -Resume midodrine  5 mg 3 times daily    Neurological   Sedation (resolved)  Sedation held and extubated 6/12. Now A&O.     History of seizure-like activity (stable)  - Home Keppra  750mg  BID    Chronic pain  History of chronic pain, treated with baclofen , naltrexone , oxycodone .   - Hold home baclofen , naltrextone, oxycodone  in the setting of hypotension and sedation  - Will slowly reintroduce some home medications due to bothersome neuropathic pain    Major depressive disorder (stable)  - Hold home bupoprion, citalopram     Insomnia (stable)  - Hold home zolpidem     PMH calcified cerebral meningioma (stable)  - no intervention at this time    Analgesia: pain adequately controlled    Pulmonary   S/p Intubation and mechanical ventilation (resolved)  Previously intubated for procedures and altered mental status. Extubated 6/5, re-intubated later the same day for worsening hypoxia. Successfully extubated 6/12 and now weaned to RA.     Pulmonary edema (stable)  Increasing hypoxia on 6/5, ultimately requiring re-intubation with FiO2 100%. CXR demonstrated moderate pulmonary edema. Etiology of pulmonary edema most likely cardiogenic given concurrent suspected stress cardiomyopathy, though she is also at risk for ARDS and noncardiogenic pulmonary edema cannot be excluded. Diuresed with improving oxygen requirement, now on room air.   - CTM    Left pleural effusion (stable)  CXR 6/4 remarkable for moderate Left pleural effusion with passive atelectasis. S/p thoracentesis on 6/7. Pleural fluid c/w transudative effusion by protein parameters.   - CTM    Cardiovascular   Undifferentiated shock  Arrived to [facility] MICU in undifferentiated shock requiring 2 pressors. Acutely worsening hypotension on 6/4 requiring fourth pressor, aggressive fluid resuscitation, and massive transfusion protocol. S/p emergent TIPS on 6/4. Isolated fever on 6/6, prerenal AKI, and central venous O2 nearly 90 concerning for some component of septic shock, though infectious workup remains unremarkable, and central venous O2 may be confounded in the setting of cirrhosis. Norepi weaned  6/14, but then increased again ON into 6/15.  Unclear etiology of ongoing pressor requirement.  No concern for ongoing bleeding stable hemoglobin and cardiogenic unlikely.  Septic shock secondary to aspiration is possible for which we are covering for Zosyn , however reassuringly she remains on room air without dyspnea.    - Resume midodrine  5mg  q8  - Titrate norepinephrine  for SBP > 100  - Abx: Zosyn     Stress cardiomyopathy - Demand Ischemia   With elevated troponin on admission (5.4k) likely 2/2 demand ischemia in the setting of shock. Echo on admission overall normal with EF > 55%. Developed several episodes of wide complex tachycardia on 6/5. Bedside POCUS with apical ballooning and slightly decreased EF to 45-50%. EKG was without acute ischemic changes. Troponin peaked at 23k (6/6). Formal TTE demonstrated reduced EF to 45% with basal and midventricular hypokinesis. Findings favored to represent stress cardiomyopathy rather than ACS in the absence of acute EKG changes. Repeat TTE 6/10 with EF 40%, marginally decreased from prior.   - Cardiology consulted, appreciate recs   - Low concern for true ACS, no need for heparinization or ischemic eval acutely  -Repeat TTE in 3 months    Wide complex tachycardia - Hx ventricular tachycardia  Developed VT arrest requiring shock at OSH with initial TIPS attempt. On 6/5 developed several episodes of wide complex tachycardia. EKG with tachycardia to 130s, thought by cardiology to represent a fib with abberancy rather than VT. She was stabilized with amiodarone  bolus, followed by amiodarone  gtt and then PO amio. Finished amio load 6/12, no role for amio moving forward as this was precipitated iso critical illness.   - Cardiology consulted, appreciate recs   - S/p po amiodarone  load  - q8h BMP  - Replete K>4, Mg>2    CAD, prior NSTEMI  Prior NSTEMI per chart review. No LHC data available. Unclear if she has history of coronary stent.   - Home aspirin  held ISO bleeding    Renal   Prerenal AKI (resolved)  Cr elevated to 1.32 from baseline ~0.8. Urine lytes most consistent with prerenal etiology, 2/2 hypoperfusion iso shock, unlikely HRS. Cr normalized, adequate UOP.  - Management of contributing etiologies as above  - q8h BMP     Infectious Disease/Autoimmune   Undifferentiated shock, c/f septic shock - Fever (resolved) - Leukocytosis (resolved)  Presented to [facility] MICU in 2-pressor shock with leukocytosis to 24.9, infectious source never identified.. With fever to 38.4 on 6/6. Paracentesis with PMNs < 250. Thoracentesis transudative. UA noninfectious. Lrcx gram stain with 1+ yeast, 1+ GPC. Fluctuating single pressor requirement since 6/9, repeat infectious workup negative thus far; had possible aspiration event overnight into 6/15 for which Zosyn  was resumed.  Will follow-up remainder of infectious workup and continue antibiotics for at least 48 hours pending clinical stability.  - Bcx ngtd, fu repeat cultures  - Fu repeat LRCx  - Zosyn  (6/14 - )    Cultures:  Blood Culture, Routine (no units)   Date Value   05/14/2024 No Growth at 24 hours   05/14/2024 No Growth at 24 hours     Urine Culture, Comprehensive (no units)   Date Value   05/14/2024 TOO YOUNG TO READ   05/04/2024 NO GROWTH     Lower Respiratory Culture (no units)   Date Value   05/07/2024 OROPHARYNGEAL FLORA ISOLATED   05/06/2024 Specimen Not Processed     WBC (10*9/L)   Date Value   05/16/2024 13.7 (H)     WBC,  UA (/HPF)   Date Value   05/10/2024 25 (H)          FEN/GI   Decompensated cirrhosis c/b esophageal varices, gastric varix, hepatic encephalopathy, ascites   Presenting with hematemesis 6 days after esophageal banding. Emergent TIPS at OSH not successful d/t inability to access the portal vein. EGD on 6/4 demonstrated nonbleeding esophageal ulcers at site of prior banding, stomach completely occluded by blood which could not be cleared endoscopically. With acute decline in Hb and hypotension on 6/4. Darius Edouard attempted with GI, but unsuccessful after numerous attempts. S/p salvage TIPS on 6/4 with no further GI bleeding.   - Hepatology following  - Daily MELLD labs (CMP, PT/INR)  - Maintain fibrinogen > 100 with cryo  - Home lasix , spironolactone  held d/t hypotension  - Continue rifaximin , continue lactulose  20g BID for goal 3-5 Bms daily    Acute on chronic liver failure - Ischemic hepatitis (improving)  LFTs continue to rise s/p variceal hemorrhage and TIPS. Unfortunately, precluded from transplant eval with newly reduced EF. LFTs slowly improving, suggesting ongoing recovery from TIPS and hemorrhagic event.  - Hepatology following    Malnutrition Assessment:   Tube feeds started 6/9. Post-extubation speech eval with recommendation for NPO, ice chips only. With improving bedside swallow eval by nursing 6/14, will advance diet as tolerate.   - Trickle feeds with FWF  - Trial oral diet       Heme/Coag   Blood loss anemia, likely 2/2 refractory portal hypertensive bleeding (resolved)  S/p 3 units pRBCs at OSH, Hb 9.3 on arrival to MICU. S/p massive transfusion protocol on 6/4. Low clinical concern for ongoing blood loss with stable Hb, last transfusion 6/4.   - q8h CBC with diff    Mild Thrombocytopenia (stable)  Plts 253k on admission, acute decline to 50k on 6/5. Currently without signs of active bleeding. Plts remain stable around ~100-150k.  - q8h CBC with diff    Endocrine   Insulin -dependent T2DM  A1c 5.6 on admission. Home regimen lantus  60, lispro with meals. BG control challenging in the setting of critical illness and stress dose steroids. S/p insulin  infusion, converted to NPH 6/8.  Continuing to titrate insulin  while off continuous tube feeds and p.o. only.    - NPH 24 units BID  - Discontinue insulin  15 units QID  - Lispro SSI QID    Integumentary   Psoriasis (stable)  History of psoriasis per chart review. With numerous chronic appearing scattered erythematous plaques with silvery scale. Of note, on Secukinumab  for psoriasis management, which can increase risk for infection.    # Irritant contact dermatitis (buttocks)  # Wound, R upper anterior leg  # Irritant contact dermatitis (anterior pelvis)  # Intertrigo along pannus  # Scalp wound 2/2 psoriasis  - WOCN consulted for high risk skin assessment Yes.  - Recommendations per WOCN note:  WOCN Recommendations:   - See nursing orders for wound care instructions.  - Contact WOCN with questions, concerns, or wound deterioration.  Topical Therapy/Interventions:   - Absorbent Dressing  - Antimicrobial Solutions  - Crusting (stoma powder or antifungal powder)  - Hydrofiber  - Zinc oxide barrier cream      Prophylaxis/LDA/Restraints/Consults   ICU checklist completed: yes (see ICU rounding navigator in Epic)    Patient Lines/Drains/Airways Status       Active Active Lines, Drains, & Airways       Name Placement date Placement time Site Days    Open Drain Inferior;Lateral;Right Back  05/14/24  1357  Back  1    Urethral Catheter Temperature probe 05/13/24  1110  Temperature probe  3    Peripheral IV 05/04/24 Anterior;Left;Upper Arm 05/04/24  0500  Arm  12    Peripheral IV 05/04/24 Anterior;Right;Upper Arm 05/04/24  0500  Arm  12    Arterial Line 05/10/24 Right Radial 05/10/24  1845  Radial  5                  Patient Lines/Drains/Airways Status       Active Wounds       Name Placement date Placement time Site Days    Surgical Site 05/04/24 Abdomen Lower;Right 05/04/24  2300  -- 11    Surgical Site 05/04/24 Neck Right 05/04/24  2300  -- 11    Surgical Site 05/04/24 Groin Right 05/04/24  2300  -- 11    Wound 05/12/24 Irritant Contact Dermatitis Rash/Dermatitis Buttocks 05/12/24  1220  Buttocks  4    Wound 05/12/24 Other (Comment) Leg Anterior;Right;Upper 05/12/24  1225  Leg  4    Wound 05/12/24 Irritant Contact Dermatitis Perspiration Pelvis Anterior;Right Intertrigo along pannus 05/12/24  1226  Pelvis  4    Wound 05/12/24 Other (Comment) Head Posterior;Upper 05/12/24  1227  Head  4                    Goals of Care     Code Status: Full Code    Designated Healthcare Decision Maker:  Ms. Griffin current decisional capacity for healthcare decision-making is Full capacity. Her designated healthcare decision maker(s) is/are   HCDM (patient stated preference): Caudle,Ryan - Relative - [phone].      Subjective     Patient without acute concern this AM. Feeling well, breathing feels well. When asked, does say that she has some difficulty breathing when she eats.    Objective     Vitals - past 24 hours  Temp:  [36.2 ??C (97.2 ??F)-37 ??C (98.6 ??F)] 36.6 ??C (97.9 ??F)  Pulse:  [74-94] 91  SpO2 Pulse:  [74-92] 90  Resp:  [14-28] 18  SpO2:  [91 %-98 %] 92 % Intake/Output  I/O last 3 completed shifts:  In: 1513.7 [P.O.:810; I.V.:253.7; IV Piggyback:450]  Out: 1796 [Urine:1351; Drains:445]     Physical Exam:    General: Ill-appearing female, lying in bed.   HEENT: Mucous membranes dry. Sclerae icteric.   CV: RRR  Pulm: CTAB on anterior exam.  GI: Normoactive bowel sounds. Soft, distended. RUQ and epigastrium nontender to deep palpation.  Extremities: Trace edema BLE.   Skin: Diffuse scattered erythematous plaques with silvery scale. No bruising, petechiae, mucocutaneous bleeding.  Neuro: AOx4    Continuous Infusions:    NORepinephrine  bitartrate-NS 6 mcg/min (05/16/24 1045)       Scheduled Medications:    clobetasol    Topical BID    enoxaparin  (LOVENOX ) injection  40 mg Subcutaneous Q24H    esomeprazole   40 mg Enteral tube: gastric BID    insulin  lispro  0-20 Units Subcutaneous ACHS    insulin  NPH  24 Units Subcutaneous Q12H    lactulose   20 g Enteral tube: gastric BID    levETIRAcetam   750 mg Enteral tube: gastric BID    midodrine   5 mg Oral TID    multivitamins (ADULT)  1 tablet Enteral tube: gastric Daily    piperacillin -tazobactam  4.5 g Intravenous Q8H    sodium chloride   10 mL Intravenous Q8H    sodium chloride   10 mL Intravenous Q8H    sodium chloride   10 mL Intravenous Q8H    ursodiol  300 mg Enteral tube: gastric 2xd PC       PRN medications:  dextrose  in water , glucagon, glucose, hydrOXYzine, melatonin    Data/Imaging Review: Reviewed in Epic and personally interpreted on 05/16/2024. See EMR for detailed results.

## 2024-05-16 NOTE — Unmapped (Addendum)
 BP goal systolic above 90 mm hg . Levo weaned to 2 mcg/min. Midodrine  added. PT/OT today. Patient able to sit at the edge of bed and standing balance with two person assist . BG above 300 .MD notified. NPH and Regular sliding insulin  as per MAR. Q2h turning done.   Problem: Adult Inpatient Plan of Care  Goal: Plan of Care Review  Outcome: Ongoing - Unchanged  Goal: Patient-Specific Goal (Individualized)  Outcome: Ongoing - Unchanged  Goal: Absence of Hospital-Acquired Illness or Injury  Outcome: Ongoing - Unchanged  Intervention: Identify and Manage Fall Risk  Recent Flowsheet Documentation  Taken 05/16/2024 0800 by Mansfield Seip, RN  Safety Interventions:   aspiration precautions   bed alarm   bleeding precautions  Intervention: Prevent Skin Injury  Recent Flowsheet Documentation  Taken 05/16/2024 1400 by Mansfield Seip, RN  Positioning for Skin: Left  Taken 05/16/2024 1200 by Mansfield Seip, RN  Positioning for Skin: Right  Taken 05/16/2024 1000 by Mansfield Seip, RN  Positioning for Skin: Left  Taken 05/16/2024 0800 by Mansfield Seip, RN  Positioning for Skin: Right  Device Skin Pressure Protection: adhesive use limited  Skin Protection: adhesive use limited  Intervention: Prevent and Manage VTE (Venous Thromboembolism) Risk  Recent Flowsheet Documentation  Taken 05/16/2024 1400 by Blondell Burgess, Norah Beals, RN  Anti-Embolism Device Status: (SQ Lovenox ) Other (Comment)  Taken 05/16/2024 1200 by Blondell Burgess, Norah Beals, RN  Anti-Embolism Device Status: (SQ Lovenox ) Other (Comment)  Taken 05/16/2024 1000 by Blondell Burgess, Norah Beals, RN  Anti-Embolism Device Status: (SQ Lovenox ) Other (Comment)  Taken 05/16/2024 0800 by Blondell Burgess, Norah Beals, RN  Anti-Embolism Device Status: (SQ Lovenox ) Other (Comment)  Intervention: Prevent Infection  Recent Flowsheet Documentation  Taken 05/16/2024 0800 by Mansfield Seip, RN  Infection Prevention:   environmental surveillance performed   equipment surfaces disinfected   rest/sleep promoted   hand hygiene promoted  Goal: Optimal Comfort and Wellbeing  Outcome: Ongoing - Unchanged  Goal: Readiness for Transition of Care  Outcome: Ongoing - Unchanged  Goal: Rounds/Family Conference  Outcome: Ongoing - Unchanged     Problem: Skin Injury Risk Increased  Goal: Skin Health and Integrity  Outcome: Ongoing - Unchanged  Intervention: Optimize Skin Protection  Recent Flowsheet Documentation  Taken 05/16/2024 0800 by Mansfield Seip, RN  Activity Management: bedrest  Pressure Reduction Techniques: frequent weight shift encouraged  Head of Bed (HOB) Positioning: HOB at 30-45 degrees  Pressure Reduction Devices:   positioning supports utilized   pressure-redistributing mattress utilized  Skin Protection: adhesive use limited     Problem: Non-Violent Restraints  Goal: Patient will remain free of restraint events  Outcome: Ongoing - Unchanged  Goal: Patient will remain free of physical injury  Outcome: Ongoing - Unchanged     Problem: Fall Injury Risk  Goal: Absence of Fall and Fall-Related Injury  Outcome: Ongoing - Unchanged  Intervention: Promote Injury-Free Environment  Recent Flowsheet Documentation  Taken 05/16/2024 0800 by Mansfield Seip, RN  Safety Interventions:   aspiration precautions   bed alarm   bleeding precautions     Problem: Wound  Goal: Optimal Coping  Outcome: Ongoing - Unchanged  Goal: Optimal Functional Ability  Outcome: Ongoing - Unchanged  Intervention: Optimize Functional Ability  Recent Flowsheet Documentation  Taken 05/16/2024 0800 by Mansfield Seip, RN  Activity Management: bedrest  Goal: Absence of Infection Signs and Symptoms  Outcome: Ongoing - Unchanged  Intervention: Prevent or Manage Infection  Recent Flowsheet Documentation  Taken 05/16/2024  0800 by Mansfield Seip, RN  Infection Management: aseptic technique maintained  Goal: Improved Oral Intake  Outcome: Ongoing - Unchanged  Goal: Optimal Pain Control and Function  Outcome: Ongoing - Unchanged  Intervention: Prevent or Manage Pain  Recent Flowsheet Documentation  Taken 05/16/2024 0800 by Mansfield Seip, RN  Sleep/Rest Enhancement: regular sleep/rest pattern promoted  Goal: Skin Health and Integrity  Outcome: Ongoing - Unchanged  Intervention: Optimize Skin Protection  Recent Flowsheet Documentation  Taken 05/16/2024 0800 by Mansfield Seip, RN  Activity Management: bedrest  Pressure Reduction Techniques: frequent weight shift encouraged  Head of Bed (HOB) Positioning: HOB at 30-45 degrees  Pressure Reduction Devices:   positioning supports utilized   pressure-redistributing mattress utilized  Skin Protection: adhesive use limited  Goal: Optimal Wound Healing  Outcome: Ongoing - Unchanged  Intervention: Promote Wound Healing  Recent Flowsheet Documentation  Taken 05/16/2024 0800 by Mansfield Seip, RN  Sleep/Rest Enhancement: regular sleep/rest pattern promoted     Problem: Self-Care Deficit  Goal: Improved Ability to Complete Activities of Daily Living  Outcome: Ongoing - Unchanged     Goal: Optimal Nutrition Delivery  Outcome: Ongoing - Unchanged  Goal: Absence of Device-Related Skin and Tissue Injury  Outcome: Ongoing - Unchanged  Intervention: Maintain Skin and Tissue Health  Recent Flowsheet Documentation  Taken 05/16/2024 0800 by Mansfield Seip, RN  Device Skin Pressure Protection: adhesive use limited  Problem: Infection  Goal: Absence of Infection Signs and Symptoms  Outcome: Ongoing - Unchanged  Intervention: Prevent or Manage Infection  Recent Flowsheet Documentation  Taken 05/16/2024 0800 by Mansfield Seip, RN  Infection Management: aseptic technique maintained

## 2024-05-17 LAB — CBC W/ AUTO DIFF
BASOPHILS ABSOLUTE COUNT: 0 10*9/L (ref 0.0–0.1)
BASOPHILS RELATIVE PERCENT: 0.2 %
EOSINOPHILS ABSOLUTE COUNT: 0.1 10*9/L (ref 0.0–0.5)
EOSINOPHILS RELATIVE PERCENT: 1.3 %
HEMATOCRIT: 26.3 % — ABNORMAL LOW (ref 34.0–44.0)
HEMOGLOBIN: 8.9 g/dL — ABNORMAL LOW (ref 11.3–14.9)
LYMPHOCYTES ABSOLUTE COUNT: 0.7 10*9/L — ABNORMAL LOW (ref 1.1–3.6)
LYMPHOCYTES RELATIVE PERCENT: 6.9 %
MEAN CORPUSCULAR HEMOGLOBIN CONC: 33.9 g/dL (ref 32.0–36.0)
MEAN CORPUSCULAR HEMOGLOBIN: 30.4 pg (ref 25.9–32.4)
MEAN CORPUSCULAR VOLUME: 89.6 fL (ref 77.6–95.7)
MEAN PLATELET VOLUME: 9 fL (ref 6.8–10.7)
MONOCYTES ABSOLUTE COUNT: 1.2 10*9/L — ABNORMAL HIGH (ref 0.3–0.8)
MONOCYTES RELATIVE PERCENT: 11.7 %
NEUTROPHILS ABSOLUTE COUNT: 8.3 10*9/L — ABNORMAL HIGH (ref 1.8–7.8)
NEUTROPHILS RELATIVE PERCENT: 79.9 %
PLATELET COUNT: 138 10*9/L — ABNORMAL LOW (ref 150–450)
RED BLOOD CELL COUNT: 2.94 10*12/L — ABNORMAL LOW (ref 3.95–5.13)
RED CELL DISTRIBUTION WIDTH: 21.9 % — ABNORMAL HIGH (ref 12.2–15.2)
WBC ADJUSTED: 10.5 10*9/L (ref 3.6–11.2)

## 2024-05-17 LAB — COMPREHENSIVE METABOLIC PANEL
ALBUMIN: 1.8 g/dL — ABNORMAL LOW (ref 3.4–5.0)
ALKALINE PHOSPHATASE: 111 U/L (ref 46–116)
ALT (SGPT): 53 U/L — ABNORMAL HIGH (ref 10–49)
ANION GAP: 9 mmol/L (ref 5–14)
AST (SGOT): 49 U/L — ABNORMAL HIGH (ref <=34)
BILIRUBIN TOTAL: 15.4 mg/dL — ABNORMAL HIGH (ref 0.3–1.2)
BLOOD UREA NITROGEN: 33 mg/dL — ABNORMAL HIGH (ref 9–23)
BUN / CREAT RATIO: 37
CALCIUM: 8.1 mg/dL — ABNORMAL LOW (ref 8.7–10.4)
CHLORIDE: 101 mmol/L (ref 98–107)
CO2: 21 mmol/L (ref 20.0–31.0)
CREATININE: 0.89 mg/dL (ref 0.55–1.02)
EGFR CKD-EPI (2021) FEMALE: 75 mL/min/1.73m2 (ref >=60)
GLUCOSE RANDOM: 271 mg/dL — ABNORMAL HIGH (ref 70–99)
POTASSIUM: 3.6 mmol/L (ref 3.4–4.8)
PROTEIN TOTAL: 5.8 g/dL (ref 5.7–8.2)
SODIUM: 131 mmol/L — ABNORMAL LOW (ref 135–145)

## 2024-05-17 LAB — PROTIME-INR
INR: 1.45
PROTIME: 16.5 s — ABNORMAL HIGH (ref 9.9–12.6)

## 2024-05-17 LAB — BLOOD GAS, ARTERIAL
BASE EXCESS ARTERIAL: -2.1 — ABNORMAL LOW (ref -2.0–2.0)
HCO3 ARTERIAL: 21 mmol/L — ABNORMAL LOW (ref 22–27)
O2 SATURATION ARTERIAL: 96.9 % (ref 94.0–100.0)
PCO2 ARTERIAL: 28.5 mmHg — ABNORMAL LOW (ref 35.0–45.0)
PH ARTERIAL: 7.48 — ABNORMAL HIGH (ref 7.35–7.45)
PO2 ARTERIAL: 78.1 mmHg — ABNORMAL LOW (ref 80.0–110.0)

## 2024-05-17 LAB — MAGNESIUM: MAGNESIUM: 2 mg/dL (ref 1.6–2.6)

## 2024-05-17 LAB — PHOSPHORUS: PHOSPHORUS: 2.7 mg/dL (ref 2.4–5.1)

## 2024-05-17 MED ADMIN — ursodiol (ACTIGALL) oral suspension: 300 mg | GASTROENTERAL | @ 12:00:00 | Stop: 2024-05-17

## 2024-05-17 MED ADMIN — esomeprazole (NEXIUM) granules 40 mg: 40 mg | GASTROENTERAL | @ 12:00:00

## 2024-05-17 MED ADMIN — insulin lispro (HumaLOG) injection CORRECTIONAL 0-20 Units: 0-20 [IU] | SUBCUTANEOUS

## 2024-05-17 MED ADMIN — insulin lispro (HumaLOG) injection CORRECTIONAL 0-20 Units: 0-20 [IU] | SUBCUTANEOUS | @ 17:00:00 | Stop: 2024-05-17

## 2024-05-17 MED ADMIN — insulin lispro (HumaLOG) injection CORRECTIONAL 0-20 Units: 0-20 [IU] | SUBCUTANEOUS | @ 22:00:00

## 2024-05-17 MED ADMIN — clobetasol (TEMOVATE) 0.05 % ointment: TOPICAL

## 2024-05-17 MED ADMIN — midodrine (PROAMATINE) tablet 10 mg: 10 mg | ORAL | @ 12:00:00

## 2024-05-17 MED ADMIN — insulin NPH (HumuLIN,NovoLIN) injection 24 Units: 24 [IU] | SUBCUTANEOUS | @ 09:00:00 | Stop: 2024-05-17

## 2024-05-17 MED ADMIN — midodrine (PROAMATINE) tablet 10 mg: 10 mg | ORAL | @ 22:00:00

## 2024-05-17 MED ADMIN — enoxaparin (LOVENOX) syringe 40 mg: 40 mg | SUBCUTANEOUS | @ 12:00:00

## 2024-05-17 MED ADMIN — esomeprazole (NEXIUM) granules 40 mg: 40 mg | GASTROENTERAL

## 2024-05-17 MED ADMIN — piperacillin-tazobactam (ZOSYN) IVPB (premix) 4.5 g: 4.5 g | INTRAVENOUS | @ 09:00:00 | Stop: 2024-05-21

## 2024-05-17 MED ADMIN — levETIRAcetam (KEPPRA) oral solution: 750 mg | GASTROENTERAL | @ 12:00:00 | Stop: 2024-05-17

## 2024-05-17 MED ADMIN — lactulose oral solution: 20 g | GASTROENTERAL | @ 12:00:00 | Stop: 2024-05-17

## 2024-05-17 MED ADMIN — insulin lispro (HumaLOG) inj PERCENTAGE MEAL EATEN 4 Units: 4 [IU] | SUBCUTANEOUS | @ 22:00:00

## 2024-05-17 MED ADMIN — clobetasol (TEMOVATE) 0.05 % ointment: TOPICAL | @ 12:00:00

## 2024-05-17 MED ADMIN — piperacillin-tazobactam (ZOSYN) IVPB (premix) 4.5 g: 4.5 g | INTRAVENOUS | @ 18:00:00 | Stop: 2024-05-21

## 2024-05-17 MED ADMIN — multivitamins, therapeutic with minerals tablet 1 tablet: 1 | GASTROENTERAL | @ 12:00:00 | Stop: 2024-05-17

## 2024-05-17 MED ADMIN — levETIRAcetam (KEPPRA) oral solution: 750 mg | GASTROENTERAL

## 2024-05-17 MED ADMIN — piperacillin-tazobactam (ZOSYN) IVPB (premix) 4.5 g: 4.5 g | INTRAVENOUS | @ 02:00:00 | Stop: 2024-05-21

## 2024-05-17 MED ADMIN — midodrine (PROAMATINE) tablet 10 mg: 10 mg | ORAL | @ 18:00:00

## 2024-05-17 NOTE — Unmapped (Signed)
 MICU Daily Progress Note     Date of Service: 05/17/2024    Problem List:   Principal Problem:    Shock     Active Problems:    Type 2 diabetes mellitus, with long-term current use of insulin        Hyperlipidemia    Hypertension    Coronary artery disease involving native heart without angina pectoris    Diabetes mellitus       Esophageal varices in cirrhosis       Hepatic encephalopathy       Calcified cerebral meningioma       Portal hypertension       Other chronic pain    OSA (obstructive sleep apnea)    Stage 3a chronic kidney disease (CMS-HCC)    Hematemesis      Interval history: Ashley Briggs is a 58 y.o. female with PMH CAD s/p NSTEMI, CKD, NAS cirrhosis c/b ascites, hepatic encephalopathy and EV s/p recent banding admitted with UGI bleed 2/2 bleeding esophageal ulcers s/p unsusccessful TIPS c/b pulsatile VT s/p shock requiring transfer to [facility] ICU. Course complicated by severe hemorrhagic shock 2/2 gastric bleed now s/p TIPS with course complicated by acute hypoxic respiratory failure, AKI, stress cardiomyopathy and hepatic encephalopathy. Hgb stable. Extubated 6/12 and weaned to RA. Remains in the MICU for pressor requirement.    24hr events:   - Mental status much improved  -Titrating insulin  regimen  - Continues on Zosyn  for possible aspiration  - Off pressors  - Increase midodrine  10 mg 3 times daily    Neurological   Sedation (resolved)  Sedation held and extubated 6/12. Now A&O.     History of seizure-like activity (stable)  - Home Keppra  750mg  BID    Chronic pain  History of chronic pain, treated with baclofen , naltrexone , oxycodone .   - Hold home baclofen , naltrextone, oxycodone  in the setting of hypotension and sedation  - Will slowly reintroduce some home medications due to bothersome neuropathic pain    Major depressive disorder (stable)  - Hold home bupoprion, citalopram     Insomnia (stable)  - Hold home zolpidem     PMH calcified cerebral meningioma (stable)  - no intervention at this time    Analgesia: pain adequately controlled    Pulmonary   S/p Intubation and mechanical ventilation (resolved)  Previously intubated for procedures and altered mental status. Extubated 6/5, re-intubated later the same day for worsening hypoxia. Successfully extubated 6/12 and now weaned to RA.     Pulmonary edema (stable)  Increasing hypoxia on 6/5, ultimately requiring re-intubation with FiO2 100%. CXR demonstrated moderate pulmonary edema. Etiology of pulmonary edema most likely cardiogenic given concurrent suspected stress cardiomyopathy, though she is also at risk for ARDS and noncardiogenic pulmonary edema cannot be excluded. Diuresed with improving oxygen requirement, now on room air.   - CTM    Left pleural effusion (stable)  CXR 6/4 remarkable for moderate Left pleural effusion with passive atelectasis. S/p thoracentesis on 6/7. Pleural fluid c/w transudative effusion by protein parameters.   - CTM    Cardiovascular   Undifferentiated shock - resolved  Arrived to [facility] MICU in undifferentiated shock requiring 2 pressors. Acutely worsening hypotension on 6/4 requiring fourth pressor, aggressive fluid resuscitation, and massive transfusion protocol. S/p emergent TIPS on 6/4. Isolated fever on 6/6, prerenal AKI, and central venous O2 nearly 90 concerning for some component of septic shock, though infectious workup remains unremarkable, and central venous O2 may be confounded in the setting of cirrhosis.  Has intermittently been on low-dose norepi since 6/14, however after switching to a SBP goal, she has been successfully weaned. Septic shock secondary to aspiration is possible for which we are covering for Zosyn , however reassuringly she remains on room air without dyspnea.    - Increase midodrine  10mg  q8  - Titrate norepinephrine  for SBP > 90  - Abx: Zosyn  (6/14 - )    Stress cardiomyopathy - Demand Ischemia   With elevated troponin on admission (5.4k) likely 2/2 demand ischemia in the setting of shock. Echo on admission overall normal with EF > 55%. Developed several episodes of wide complex tachycardia on 6/5. Bedside POCUS with apical ballooning and slightly decreased EF to 45-50%. EKG was without acute ischemic changes. Troponin peaked at 23k (6/6). Formal TTE demonstrated reduced EF to 45% with basal and midventricular hypokinesis. Findings favored to represent stress cardiomyopathy rather than ACS in the absence of acute EKG changes. Repeat TTE 6/10 with EF 40%, marginally decreased from prior.   -Repeat TTE in 3 months    Wide complex tachycardia - Hx ventricular tachycardia  Developed VT arrest requiring shock at OSH with initial TIPS attempt. On 6/5 developed several episodes of wide complex tachycardia. EKG with tachycardia to 130s, thought by cardiology to represent a fib with abberancy rather than VT. She was stabilized with amiodarone  bolus, followed by amiodarone  gtt and then PO amio. Finished amio load 6/12, no role for amio moving forward as this was precipitated iso critical illness.   - Daily CMP  - Replete K>4, Mg>2    CAD, prior NSTEMI  Prior NSTEMI per chart review. No LHC data available. Unclear if she has history of coronary stent.   - Home aspirin  held ISO bleeding    Renal   Prerenal AKI (resolved)  Cr elevated to 1.32 from baseline ~0.8. Urine lytes most consistent with prerenal etiology, 2/2 hypoperfusion iso shock, unlikely HRS. Cr normalized, adequate UOP.  - Daily CMP    Infectious Disease/Autoimmune   Aspiration pneumonia  Presented to [facility] MICU in 2-pressor shock with leukocytosis to 24.9, infectious source never identified.. With fever to 38.4 on 6/6. Paracentesis with PMNs < 250. Thoracentesis transudative. UA noninfectious. Lrcx gram stain with 1+ yeast, 1+ GPC. Fluctuating single pressor requirement since 6/9, repeat infectious workup negative. Had possible aspiration event overnight into 6/15 for which Zosyn  was resumed, she is clinically improved since starting antibiotics, anticipate full treatment course for aspiration pneumonia for 5 to 7 days..  - Bcx ngtd  - Fu repeat LRCx  - Zosyn  (6/14 - )    Cultures:  Blood Culture, Routine (no units)   Date Value   05/14/2024 No Growth at 48 hours   05/14/2024 No Growth at 48 hours     Urine Culture, Comprehensive (no units)   Date Value   05/14/2024 TOO YOUNG TO READ   05/04/2024 NO GROWTH     Lower Respiratory Culture (no units)   Date Value   05/07/2024 OROPHARYNGEAL FLORA ISOLATED   05/06/2024 Specimen Not Processed     WBC (10*9/L)   Date Value   05/17/2024 10.5     WBC, UA (/HPF)   Date Value   05/10/2024 25 (H)          FEN/GI   Decompensated cirrhosis c/b esophageal varices, gastric varix, hepatic encephalopathy, ascites   Presenting with hematemesis 6 days after esophageal banding. Emergent TIPS at OSH not successful d/t inability to access the portal vein. EGD on 6/4 demonstrated nonbleeding esophageal  ulcers at site of prior banding, stomach completely occluded by blood which could not be cleared endoscopically. With acute decline in Hb and hypotension on 6/4. Darius Edouard attempted with GI, but unsuccessful after numerous attempts. S/p salvage TIPS on 6/4 with no further GI bleeding.   - Hepatology following  - Daily MELLD labs (CMP, PT/INR)  - Maintain fibrinogen > 100 with cryo  - Home lasix , spironolactone  held d/t hypotension  - Continue rifaximin , continue lactulose  20g BID for goal 3-5 Bms daily    Acute on chronic liver failure - Ischemic hepatitis (improving)  LFTs continue to rise s/p variceal hemorrhage and TIPS. Unfortunately, precluded from transplant eval with newly reduced EF. LFTs slowly improving, suggesting ongoing recovery from TIPS and hemorrhagic event.  - Hepatology following    Malnutrition Assessment:   Tube feeds started 6/9.  Passed swallow study, on regular diet and eating well.  - Continue full diet       Heme/Coag   Blood loss anemia, likely 2/2 refractory portal hypertensive bleeding (resolved)  S/p 3 units pRBCs at OSH, Hb 9.3 on arrival to MICU. S/p massive transfusion protocol on 6/4. Low clinical concern for ongoing blood loss with stable Hb, last transfusion 6/4.   - Daily CBC    Mild Thrombocytopenia (stable)  Plts 253k on admission, acute decline to 50k on 6/5. Currently without signs of active bleeding. Plts remain stable around ~100-150k.  - Daily CBC    Endocrine   Insulin -dependent T2DM  A1c 5.6 on admission. Home regimen lantus  60, lispro with meals. BG control challenging in the setting of critical illness and stress dose steroids. S/p insulin  infusion, converted to NPH 6/8.  Continuing to titrate insulin  while off continuous tube feeds and p.o. only.    - NPH 24 units BID  - Discontinue insulin  15 units QID  - Lispro SSI QID    Integumentary   Psoriasis (stable)  History of psoriasis per chart review. With numerous chronic appearing scattered erythematous plaques with silvery scale. Of note, on Secukinumab  for psoriasis management, which can increase risk for infection.    # Irritant contact dermatitis (buttocks)  # Wound, R upper anterior leg  # Irritant contact dermatitis (anterior pelvis)  # Intertrigo along pannus  # Scalp wound 2/2 psoriasis  - WOCN consulted for high risk skin assessment Yes.  - Recommendations per WOCN note:  WOCN Recommendations:   - See nursing orders for wound care instructions.  - Contact WOCN with questions, concerns, or wound deterioration.  Topical Therapy/Interventions:   - Absorbent Dressing  - Antimicrobial Solutions  - Crusting (stoma powder or antifungal powder)  - Hydrofiber  - Zinc oxide barrier cream      Prophylaxis/LDA/Restraints/Consults   ICU checklist completed: yes (see ICU rounding navigator in Epic)    Patient Lines/Drains/Airways Status       Active Active Lines, Drains, & Airways       Name Placement date Placement time Site Days    Open Drain Inferior;Lateral;Right Back 05/14/24  1357  Back  2    Urethral Catheter Temperature probe 05/13/24  1110 Temperature probe  3    Peripheral IV 05/04/24 Anterior;Left;Upper Arm 05/04/24  0500  Arm  13    Peripheral IV 05/04/24 Anterior;Right;Upper Arm 05/04/24  0500  Arm  13    Arterial Line 05/10/24 Right Radial 05/10/24  1845  Radial  6                  Patient Lines/Drains/Airways Status  Active Wounds       Name Placement date Placement time Site Days    Surgical Site 05/04/24 Abdomen Lower;Right 05/04/24  2300  -- 12    Surgical Site 05/04/24 Neck Right 05/04/24  2300  -- 12    Surgical Site 05/04/24 Groin Right 05/04/24  2300  -- 12    Wound 05/12/24 Irritant Contact Dermatitis Rash/Dermatitis Buttocks 05/12/24  1220  Buttocks  4    Wound 05/12/24 Other (Comment) Leg Anterior;Right;Upper 05/12/24  1225  Leg  4    Wound 05/12/24 Irritant Contact Dermatitis Perspiration Pelvis Anterior;Right Intertrigo along pannus 05/12/24  1226  Pelvis  4    Wound 05/12/24 Other (Comment) Head Posterior;Upper 05/12/24  1227  Head  4                    Goals of Care     Code Status: Full Code    Designated Healthcare Decision Maker:  Ms. Speedy current decisional capacity for healthcare decision-making is Full capacity. Her designated healthcare decision maker(s) is/are   HCDM (patient stated preference): Caudle,Ryan - Relative - [phone].      Subjective     Patient doing well this morning.  No acute concerns.  Has been off pressors overnight.  Denies any dyspnea or abdominal pain.  Eating well.    Objective     Vitals - past 24 hours  Temp:  [36.1 ??C (97 ??F)-36.6 ??C (97.9 ??F)] 36.2 ??C (97.2 ??F)  Pulse:  [74-104] 91  SpO2 Pulse:  [73-105] 93  Resp:  [12-26] 20  BP: (85)/(45) 85/45  SpO2:  [90 %-98 %] 95 % Intake/Output  I/O last 3 completed shifts:  In: 1341.3 [P.O.:660; I.V.:230.5; IV Piggyback:450.8]  Out: 2520 [Urine:1780; Drains:740]     Physical Exam:    General: Ill-appearing female, lying in bed.   HEENT: Mucous membranes dry. Sclerae icteric.   CV: RRR  Pulm: CTAB on anterior exam.  GI: Normoactive bowel sounds. Soft, distended. Non-tender to palpation  Extremities: Trace edema BLE.   Skin: Diffuse scattered erythematous plaques with silvery scale. No bruising, petechiae, mucocutaneous bleeding.  Neuro: AOx4    Continuous Infusions:    NORepinephrine  bitartrate-NS Stopped (05/17/24 0300)       Scheduled Medications:    clobetasol    Topical BID    enoxaparin  (LOVENOX ) injection  40 mg Subcutaneous Q24H    esomeprazole   40 mg Enteral tube: gastric BID    insulin  lispro  0-20 Units Subcutaneous ACHS    insulin  NPH  30 Units Subcutaneous Q12H    lactulose   20 g Enteral tube: gastric BID    levETIRAcetam   750 mg Enteral tube: gastric BID    midodrine   10 mg Oral TID    multivitamins (ADULT)  1 tablet Enteral tube: gastric Daily    piperacillin -tazobactam  4.5 g Intravenous Q8H    sodium chloride   10 mL Intravenous Q8H    sodium chloride   10 mL Intravenous Q8H    sodium chloride   10 mL Intravenous Q8H    ursodiol  300 mg Enteral tube: gastric 2xd PC       PRN medications:  dextrose  in water , glucagon, glucose, hydrOXYzine, melatonin    Data/Imaging Review: Reviewed in Epic and personally interpreted on 05/17/2024. See EMR for detailed results.    Rexine Cater, MD

## 2024-05-17 NOTE — Unmapped (Signed)
 Problem: Adult Inpatient Plan of Care  Goal: Plan of Care Review  Outcome: Progressing  Goal: Patient-Specific Goal (Individualized)  Outcome: Progressing  Goal: Absence of Hospital-Acquired Illness or Injury  Outcome: Progressing  Intervention: Identify and Manage Fall Risk  Recent Flowsheet Documentation  Taken 05/16/2024 2000 by Aleck Amis, RN  Safety Interventions:   aspiration precautions   lighting adjusted for tasks/safety   low bed  Intervention: Prevent Skin Injury  Recent Flowsheet Documentation  Taken 05/17/2024 0600 by Aleck Amis, RN  Positioning for Skin: Left  Taken 05/17/2024 0400 by Aleck Amis, RN  Positioning for Skin: Right  Taken 05/17/2024 0200 by Aleck Amis, RN  Positioning for Skin: Left  Taken 05/17/2024 0000 by Aleck Amis, RN  Positioning for Skin: Right  Taken 05/16/2024 2200 by Aleck Amis, RN  Positioning for Skin: Left  Taken 05/16/2024 2000 by Aleck Amis, RN  Positioning for Skin: Right  Intervention: Prevent Infection  Recent Flowsheet Documentation  Taken 05/16/2024 2000 by Aleck Amis, RN  Infection Prevention:   cohorting utilized   hand hygiene promoted  Goal: Optimal Comfort and Wellbeing  Outcome: Progressing  Goal: Readiness for Transition of Care  Outcome: Progressing  Goal: Rounds/Family Conference  Outcome: Progressing     Problem: Skin Injury Risk Increased  Goal: Skin Health and Integrity  Outcome: Progressing  Intervention: Optimize Skin Protection  Recent Flowsheet Documentation  Taken 05/16/2024 2000 by Aleck Amis, RN  Activity Management: bedrest  Pressure Reduction Techniques:   frequent weight shift encouraged   weight shift assistance provided   sit time limited to 2 hours  Head of Bed (HOB) Positioning: HOB at 30-45 degrees  Pressure Reduction Devices:   positioning supports utilized   pressure-redistributing mattress utilized     Problem: Fall Injury Risk  Goal: Absence of Fall and Fall-Related Injury  Outcome: Progressing  Intervention: Promote Scientist, clinical (histocompatibility and immunogenetics) Documentation  Taken 05/16/2024 2000 by Aleck Amis, RN  Safety Interventions:   aspiration precautions   lighting adjusted for tasks/safety   low bed     Problem: Non-Violent Restraints  Goal: Patient will remain free of restraint events  Outcome: Progressing  Goal: Patient will remain free of physical injury  Outcome: Progressing     Problem: Wound  Goal: Optimal Coping  Outcome: Progressing  Goal: Optimal Functional Ability  Outcome: Progressing  Intervention: Optimize Functional Ability  Recent Flowsheet Documentation  Taken 05/16/2024 2000 by Aleck Amis, RN  Activity Management: bedrest  Goal: Absence of Infection Signs and Symptoms  Outcome: Progressing  Goal: Improved Oral Intake  Outcome: Progressing  Goal: Optimal Pain Control and Function  Outcome: Progressing  Goal: Skin Health and Integrity  Outcome: Progressing  Intervention: Optimize Skin Protection  Recent Flowsheet Documentation  Taken 05/16/2024 2000 by Aleck Amis, RN  Activity Management: bedrest  Pressure Reduction Techniques:   frequent weight shift encouraged   weight shift assistance provided   sit time limited to 2 hours  Head of Bed (HOB) Positioning: HOB at 30-45 degrees  Pressure Reduction Devices:   positioning supports utilized   pressure-redistributing mattress utilized  Goal: Optimal Wound Healing  Outcome: Progressing     Problem: Self-Care Deficit  Goal: Improved Ability to Complete Activities of Daily Living  Outcome: Progressing     Problem: Mechanical Ventilation Invasive  Goal: Effective Communication  Outcome: Progressing  Goal: Optimal Device Function  Outcome: Progressing  Intervention: Optimize Device Care and  Function  Recent Flowsheet Documentation  Taken 05/17/2024 0200 by Aleck Amis, RN  Oral Care: mouth swabbed  Goal: Mechanical Ventilation Liberation  Outcome: Progressing  Goal: Optimal Nutrition Delivery  Outcome: Progressing  Goal: Absence of Device-Related Skin and Tissue Injury  Outcome: Progressing  Goal: Absence of Ventilator-Induced Lung Injury  Outcome: Progressing  Intervention: Prevent Ventilator-Associated Pneumonia  Recent Flowsheet Documentation  Taken 05/17/2024 0200 by Aleck Amis, RN  Oral Care: mouth swabbed  Taken 05/16/2024 2000 by Aleck Amis, RN  Head of Bed [facility]) Positioning: HOB at 30-45 degrees     Problem: Artificial Airway  Goal: Effective Communication  Outcome: Progressing  Goal: Optimal Device Function  Outcome: Progressing  Intervention: Optimize Device Care and Function  Recent Flowsheet Documentation  Taken 05/17/2024 0200 by Aleck Amis, RN  Oral Care: mouth swabbed  Taken 05/16/2024 2000 by Aleck Amis, RN  Aspiration Precautions:   oral hygiene care promoted   respiratory status monitored  Goal: Absence of Device-Related Skin or Tissue Injury  Outcome: Progressing     Problem: Infection  Goal: Absence of Infection Signs and Symptoms  Outcome: Progressing

## 2024-05-17 NOTE — Unmapped (Signed)
 Copied from CRM #[id number]. Topic: Other - Other  >> May 17, 2024  4:36 PM Teretha Ferguson wrote:    Bartholomew Light RN Case manager for [facility] wanted to let pt's PCP know that she is currently admitted @ [facility]. Bartholomew Light can be reached for any questions at [phone]

## 2024-05-18 LAB — CBC W/ AUTO DIFF
BASOPHILS ABSOLUTE COUNT: 0 10*9/L (ref 0.0–0.1)
BASOPHILS RELATIVE PERCENT: 0.3 %
EOSINOPHILS ABSOLUTE COUNT: 0.1 10*9/L (ref 0.0–0.5)
EOSINOPHILS RELATIVE PERCENT: 1.2 %
HEMATOCRIT: 25.8 % — ABNORMAL LOW (ref 34.0–44.0)
HEMOGLOBIN: 8.8 g/dL — ABNORMAL LOW (ref 11.3–14.9)
LYMPHOCYTES ABSOLUTE COUNT: 1.3 10*9/L (ref 1.1–3.6)
LYMPHOCYTES RELATIVE PERCENT: 14.7 %
MEAN CORPUSCULAR HEMOGLOBIN CONC: 34.1 g/dL (ref 32.0–36.0)
MEAN CORPUSCULAR HEMOGLOBIN: 30.1 pg (ref 25.9–32.4)
MEAN CORPUSCULAR VOLUME: 88.4 fL (ref 77.6–95.7)
MEAN PLATELET VOLUME: 9.1 fL (ref 6.8–10.7)
MONOCYTES ABSOLUTE COUNT: 1 10*9/L — ABNORMAL HIGH (ref 0.3–0.8)
MONOCYTES RELATIVE PERCENT: 11.2 %
NEUTROPHILS ABSOLUTE COUNT: 6.5 10*9/L (ref 1.8–7.8)
NEUTROPHILS RELATIVE PERCENT: 72.6 %
PLATELET COUNT: 147 10*9/L — ABNORMAL LOW (ref 150–450)
RED BLOOD CELL COUNT: 2.92 10*12/L — ABNORMAL LOW (ref 3.95–5.13)
RED CELL DISTRIBUTION WIDTH: 21.3 % — ABNORMAL HIGH (ref 12.2–15.2)
WBC ADJUSTED: 9 10*9/L (ref 3.6–11.2)

## 2024-05-18 LAB — COMPREHENSIVE METABOLIC PANEL
ALBUMIN: 1.7 g/dL — ABNORMAL LOW (ref 3.4–5.0)
ALKALINE PHOSPHATASE: 115 U/L (ref 46–116)
ALT (SGPT): 49 U/L (ref 10–49)
ANION GAP: 10 mmol/L (ref 5–14)
AST (SGOT): 52 U/L — ABNORMAL HIGH (ref <=34)
BILIRUBIN TOTAL: 15.7 mg/dL — ABNORMAL HIGH (ref 0.3–1.2)
BLOOD UREA NITROGEN: 31 mg/dL — ABNORMAL HIGH (ref 9–23)
BUN / CREAT RATIO: 35
CALCIUM: 7.9 mg/dL — ABNORMAL LOW (ref 8.7–10.4)
CHLORIDE: 100 mmol/L (ref 98–107)
CO2: 22 mmol/L (ref 20.0–31.0)
CREATININE: 0.89 mg/dL (ref 0.55–1.02)
EGFR CKD-EPI (2021) FEMALE: 75 mL/min/1.73m2 (ref >=60)
GLUCOSE RANDOM: 239 mg/dL — ABNORMAL HIGH (ref 70–179)
POTASSIUM: 3.6 mmol/L (ref 3.4–4.8)
PROTEIN TOTAL: 5.4 g/dL — ABNORMAL LOW (ref 5.7–8.2)
SODIUM: 132 mmol/L — ABNORMAL LOW (ref 135–145)

## 2024-05-18 LAB — PROTIME-INR
INR: 1.39
PROTIME: 15.8 s — ABNORMAL HIGH (ref 9.9–12.6)

## 2024-05-18 LAB — MAGNESIUM: MAGNESIUM: 2 mg/dL (ref 1.6–2.6)

## 2024-05-18 LAB — PHOSPHORUS: PHOSPHORUS: 2.6 mg/dL (ref 2.4–5.1)

## 2024-05-18 LAB — FIBRINOGEN: FIBRINOGEN LEVEL: 166 mg/dL — ABNORMAL LOW (ref 175–500)

## 2024-05-18 MED ADMIN — ursodiol (ACTIGALL) capsule 300 mg: 300 mg | ORAL | @ 13:00:00

## 2024-05-18 MED ADMIN — piperacillin-tazobactam (ZOSYN) IVPB (premix) 4.5 g: 4.5 g | INTRAVENOUS | @ 18:00:00 | Stop: 2024-05-21

## 2024-05-18 MED ADMIN — clobetasol (TEMOVATE) 0.05 % ointment: TOPICAL | @ 13:00:00

## 2024-05-18 MED ADMIN — esomeprazole (NEXIUM) granules 40 mg: 40 mg | GASTROENTERAL | @ 01:00:00

## 2024-05-18 MED ADMIN — multivitamins, therapeutic with minerals tablet 1 tablet: 1 | ORAL | @ 13:00:00

## 2024-05-18 MED ADMIN — pantoprazole (Protonix) EC tablet 40 mg: 40 mg | ORAL | @ 13:00:00

## 2024-05-18 MED ADMIN — enoxaparin (LOVENOX) syringe 40 mg: 40 mg | SUBCUTANEOUS | @ 13:00:00

## 2024-05-18 MED ADMIN — insulin lispro (HumaLOG) injection CORRECTIONAL 0-20 Units: 0-20 [IU] | SUBCUTANEOUS | @ 22:00:00

## 2024-05-18 MED ADMIN — insulin lispro (HumaLOG) inj PERCENTAGE MEAL EATEN 6 Units: 6 [IU] | SUBCUTANEOUS | @ 18:00:00

## 2024-05-18 MED ADMIN — midodrine (PROAMATINE) tablet 15 mg: 15 mg | ORAL | @ 13:00:00

## 2024-05-18 MED ADMIN — insulin lispro (HumaLOG) injection CORRECTIONAL 0-20 Units: 0-20 [IU] | SUBCUTANEOUS | @ 01:00:00

## 2024-05-18 MED ADMIN — piperacillin-tazobactam (ZOSYN) IVPB (premix) 4.5 g: 4.5 g | INTRAVENOUS | @ 09:00:00 | Stop: 2024-05-21

## 2024-05-18 MED ADMIN — potassium chloride ER tablet 40 mEq: 40 meq | ORAL | @ 01:00:00 | Stop: 2024-05-17

## 2024-05-18 MED ADMIN — insulin lispro (HumaLOG) inj PERCENTAGE MEAL EATEN 6 Units: 6 [IU] | SUBCUTANEOUS | @ 22:00:00

## 2024-05-18 MED ADMIN — midodrine (PROAMATINE) tablet 15 mg: 15 mg | ORAL | @ 18:00:00

## 2024-05-18 MED ADMIN — insulin NPH (HumuLIN,NovoLIN) injection 30 Units: 30 [IU] | SUBCUTANEOUS | @ 01:00:00

## 2024-05-18 MED ADMIN — midodrine (PROAMATINE) tablet 15 mg: 15 mg | ORAL | @ 20:00:00

## 2024-05-18 MED ADMIN — clobetasol (TEMOVATE) 0.05 % ointment: TOPICAL | @ 01:00:00

## 2024-05-18 MED ADMIN — levETIRAcetam (KEPPRA) tablet 750 mg: 750 mg | ORAL | @ 01:00:00

## 2024-05-18 MED ADMIN — insulin NPH (HumuLIN,NovoLIN) injection 30 Units: 30 [IU] | SUBCUTANEOUS | @ 13:00:00 | Stop: 2024-05-18

## 2024-05-18 MED ADMIN — piperacillin-tazobactam (ZOSYN) IVPB (premix) 4.5 g: 4.5 g | INTRAVENOUS | @ 01:00:00 | Stop: 2024-05-21

## 2024-05-18 MED ADMIN — albumin human 5 % 25 g: 25 g | INTRAVENOUS | @ 09:00:00 | Stop: 2024-05-18

## 2024-05-18 MED ADMIN — ursodiol (ACTIGALL) capsule 300 mg: 300 mg | ORAL | @ 01:00:00

## 2024-05-18 MED ADMIN — potassium chloride ER tablet 40 mEq: 40 meq | ORAL | @ 13:00:00 | Stop: 2024-05-18

## 2024-05-18 MED ADMIN — insulin lispro (HumaLOG) injection CORRECTIONAL 0-20 Units: 0-20 [IU] | SUBCUTANEOUS | @ 13:00:00

## 2024-05-18 MED ADMIN — levETIRAcetam (KEPPRA) tablet 750 mg: 750 mg | ORAL | @ 13:00:00

## 2024-05-18 MED ADMIN — ondansetron (ZOFRAN) injection 4 mg: 4 mg | INTRAVENOUS | @ 09:00:00

## 2024-05-18 MED ADMIN — acetaminophen (TYLENOL) tablet 650 mg: 650 mg | ORAL | @ 20:00:00

## 2024-05-18 MED ADMIN — insulin lispro (HumaLOG) inj PERCENTAGE MEAL EATEN 6 Units: 6 [IU] | SUBCUTANEOUS | @ 13:00:00

## 2024-05-18 NOTE — Unmapped (Signed)
 MICU Daily Progress Note     Date of Service: 05/18/2024    Problem List:   Principal Problem:    Shock     Active Problems:    Type 2 diabetes mellitus, with long-term current use of insulin        Hyperlipidemia    Hypertension    Coronary artery disease involving native heart without angina pectoris    Diabetes mellitus       Esophageal varices in cirrhosis       Hepatic encephalopathy       Calcified cerebral meningioma       Portal hypertension       Other chronic pain    OSA (obstructive sleep apnea)    Stage 3a chronic kidney disease (CMS-HCC)    Hematemesis      Interval history: Ashley Briggs is a 58 y.o. female with PMH CAD s/p NSTEMI, CKD, NAS cirrhosis c/b ascites, hepatic encephalopathy and EV s/p recent banding admitted with UGI bleed 2/2 bleeding esophageal ulcers s/p unsusccessful TIPS c/b pulsatile VT s/p shock requiring transfer to [facility] ICU. Course complicated by severe hemorrhagic shock 2/2 gastric bleed now s/p TIPS with course complicated by acute hypoxic respiratory failure, AKI, stress cardiomyopathy and hepatic encephalopathy. Hgb stable. Extubated 6/12 and weaned to RA. Remains in the MICU for pressor requirement.    24hr events:   - Mental status much improved  -Titrating insulin  regimen  - Continues on Zosyn  for possible aspiration  - Off pressors  - Increase midodrine  10 mg 3 times daily    Neurological   Sedation (resolved)  Sedation held and extubated 6/12. Now A&O.     History of seizure-like activity (stable)  - Home Keppra  750mg  BID    Chronic pain  History of chronic pain, treated with baclofen , naltrexone , oxycodone .   - Hold home baclofen , naltrextone, oxycodone  in the setting of hypotension and sedation  - Will slowly reintroduce some home medications due to bothersome neuropathic pain    Major depressive disorder (stable)  - Hold home bupoprion, citalopram     Insomnia (stable)  - Hold home zolpidem     PMH calcified cerebral meningioma (stable)  - no intervention at this time    Analgesia: pain adequately controlled    Pulmonary   S/p Intubation and mechanical ventilation (resolved)  Previously intubated for procedures and altered mental status. Extubated 6/5, re-intubated later the same day for worsening hypoxia. Successfully extubated 6/12 and now weaned to RA.     Pulmonary edema (stable)  Increasing hypoxia on 6/5, ultimately requiring re-intubation with FiO2 100%. CXR demonstrated moderate pulmonary edema. Etiology of pulmonary edema most likely cardiogenic given concurrent suspected stress cardiomyopathy, though she is also at risk for ARDS and noncardiogenic pulmonary edema cannot be excluded. Diuresed with improving oxygen requirement, now on room air.   - CTM    Left pleural effusion (stable)  CXR 6/4 remarkable for moderate Left pleural effusion with passive atelectasis. S/p thoracentesis on 6/7. Pleural fluid c/w transudative effusion by protein parameters.   - CTM    Cardiovascular   Undifferentiated shock - resolved  Arrived to [facility] MICU in undifferentiated shock requiring 2 pressors. Acutely worsening hypotension on 6/4 requiring fourth pressor, aggressive fluid resuscitation, and massive transfusion protocol. S/p emergent TIPS on 6/4. Isolated fever on 6/6, prerenal AKI, and central venous O2 nearly 90 concerning for some component of septic shock, though infectious workup remains unremarkable, and central venous O2 may be confounded in the setting of cirrhosis.  Has intermittently been on low-dose norepi since 6/14, however after switching to a SBP goal, she has been successfully weaned. Septic shock secondary to aspiration is possible for which we are covering for Zosyn , however reassuringly she remains on room air without dyspnea.  She continues to intermittently have low blood pressures while she is asleep, however suspect this is her baseline and she continues to [facility] appropriately without any evidence of poor perfusion.  Will increase midodrine .  - Increase midodrine  15mg  q8  - Titrate norepinephrine  for SBP > 90  - Abx: Zosyn  (6/14 - 6/20 )    Stress cardiomyopathy - Demand Ischemia   With elevated troponin on admission (5.4k) likely 2/2 demand ischemia in the setting of shock. Echo on admission overall normal with EF > 55%. Developed several episodes of wide complex tachycardia on 6/5. Bedside POCUS with apical ballooning and slightly decreased EF to 45-50%. EKG was without acute ischemic changes. Troponin peaked at 23k (6/6). Formal TTE demonstrated reduced EF to 45% with basal and midventricular hypokinesis. Findings favored to represent stress cardiomyopathy rather than ACS in the absence of acute EKG changes. Repeat TTE 6/10 with EF 40%, marginally decreased from prior.   -Repeat TTE in 3 months    Wide complex tachycardia - Hx ventricular tachycardia  Developed VT arrest requiring shock at OSH with initial TIPS attempt. On 6/5 developed several episodes of wide complex tachycardia. EKG with tachycardia to 130s, thought by cardiology to represent a fib with abberancy rather than VT. She was stabilized with amiodarone  bolus, followed by amiodarone  gtt and then PO amio. Finished amio load 6/12, no role for amio moving forward as this was precipitated iso critical illness.   - Daily CMP  - Replete K>4, Mg>2    CAD, prior NSTEMI  Prior NSTEMI per chart review. No LHC data available. Unclear if she has history of coronary stent.   - Home aspirin  held ISO bleeding    Renal   Prerenal AKI (resolved)  Cr elevated to 1.32 from baseline ~0.8. Urine lytes most consistent with prerenal etiology, 2/2 hypoperfusion iso shock, unlikely HRS. Cr normalized, adequate UOP.  - Daily CMP    Infectious Disease/Autoimmune   Aspiration pneumonia  Presented to [facility] MICU in 2-pressor shock with leukocytosis to 24.9, infectious source never identified.. With fever to 38.4 on 6/6. Paracentesis with PMNs < 250. Thoracentesis transudative. UA noninfectious. Lrcx gram stain with 1+ yeast, 1+ GPC. Fluctuating single pressor requirement since 6/9, repeat infectious workup negative. Had possible aspiration event overnight into 6/15 for which Zosyn  was resumed, she is clinically improved since starting antibiotics, anticipate full treatment course for aspiration pneumonia for 7 days..  - Bcx ngtd  - Fu repeat LRCx  - Zosyn  (6/14 - )    Cultures:  Blood Culture, Routine (no units)   Date Value   05/14/2024 No Growth at 72 hours   05/14/2024 No Growth at 72 hours     Urine Culture, Comprehensive (no units)   Date Value   05/14/2024 10,000 to 50,000 CFU/mL Candida glabrata (A)   05/04/2024 NO GROWTH     Lower Respiratory Culture (no units)   Date Value   05/07/2024 OROPHARYNGEAL FLORA ISOLATED   05/06/2024 Specimen Not Processed     WBC (10*9/L)   Date Value   05/18/2024 9.0     WBC, UA (/HPF)   Date Value   05/10/2024 25 (H)          FEN/GI   Decompensated cirrhosis c/b esophageal  varices, gastric varix, hepatic encephalopathy, ascites   Presenting with hematemesis 6 days after esophageal banding. Emergent TIPS at OSH not successful d/t inability to access the portal vein. EGD on 6/4 demonstrated nonbleeding esophageal ulcers at site of prior banding, stomach completely occluded by blood which could not be cleared endoscopically. With acute decline in Hb and hypotension on 6/4. Darius Edouard attempted with GI, but unsuccessful after numerous attempts. S/p salvage TIPS on 6/4 with no further GI bleeding.   - Hepatology following  - Daily MELLD labs (CMP, PT/INR)  - Maintain fibrinogen > 100 with cryo  - Home lasix , spironolactone  held d/t hypotension  - Continue rifaximin , continue lactulose  20g BID for goal 3-5 Bms daily    Acute on chronic liver failure - Ischemic hepatitis (improving)  LFTs continue to rise s/p variceal hemorrhage and TIPS. Unfortunately, precluded from transplant eval with newly reduced EF. LFTs slowly improving, suggesting ongoing recovery from TIPS and hemorrhagic event.  - Hepatology following    Malnutrition Assessment:   Tube feeds started 6/9.  Passed swallow study, on regular diet and eating well.  - Continue full diet       Heme/Coag   Blood loss anemia, likely 2/2 refractory portal hypertensive bleeding (resolved)  S/p 3 units pRBCs at OSH, Hb 9.3 on arrival to MICU. S/p massive transfusion protocol on 6/4. Low clinical concern for ongoing blood loss with stable Hb, last transfusion 6/4.   - Daily CBC    Mild Thrombocytopenia (stable)  Plts 253k on admission, acute decline to 50k on 6/5. Currently without signs of active bleeding. Plts remain stable around ~100-150k.  - Daily CBC    Endocrine   Insulin -dependent T2DM  A1c 5.6 on admission. Home regimen lantus  60, lispro with meals. BG control challenging in the setting of critical illness and stress dose steroids. S/p insulin  infusion, converted to NPH 6/8.  Continuing to titrate insulin  while off continuous tube feeds and p.o. only.  Will consolidate NPH to glargine 60 units nightly (home dose) and restart nutritional insulin  as well.  - Glargine 60 units nightly  - Start lispro 6u TIDAC  - Lispro SSI QID    Integumentary   Psoriasis (stable)  History of psoriasis per chart review. With numerous chronic appearing scattered erythematous plaques with silvery scale. Of note, on Secukinumab  for psoriasis management, which can increase risk for infection.  -Continue clobetasol      # Irritant contact dermatitis (buttocks)  # Wound, R upper anterior leg  # Irritant contact dermatitis (anterior pelvis)  # Intertrigo along pannus  # Scalp wound 2/2 psoriasis  - WOCN consulted for high risk skin assessment Yes.  - Recommendations per WOCN note:  WOCN Recommendations:   - See nursing orders for wound care instructions.  - Contact WOCN with questions, concerns, or wound deterioration.  Topical Therapy/Interventions:   - Absorbent Dressing  - Antimicrobial Solutions  - Crusting (stoma powder or antifungal powder)  - Hydrofiber  - Zinc oxide barrier cream      Prophylaxis/LDA/Restraints/Consults   ICU checklist completed: yes (see ICU rounding navigator in Epic)    Patient Lines/Drains/Airways Status       Active Active Lines, Drains, & Airways       Name Placement date Placement time Site Days    Open Drain Inferior;Lateral;Right Back 05/14/24  1357  Back  3    Urethral Catheter Temperature probe 05/13/24  1110  Temperature probe  4    Peripheral IV 05/04/24 Anterior;Left;Upper Arm 05/04/24  0500  Arm  14    Peripheral IV 05/04/24 Anterior;Right;Upper Arm 05/04/24  0500  Arm  14    Arterial Line 05/10/24 Right Radial 05/10/24  1845  Radial  7                  Patient Lines/Drains/Airways Status       Active Wounds       Name Placement date Placement time Site Days    Surgical Site 05/04/24 Abdomen Lower;Right 05/04/24  2300  -- 13    Surgical Site 05/04/24 Neck Right 05/04/24  2300  -- 13    Surgical Site 05/04/24 Groin Right 05/04/24  2300  -- 13    Wound 05/12/24 Irritant Contact Dermatitis Rash/Dermatitis Buttocks 05/12/24  1220  Buttocks  5    Wound 05/12/24 Other (Comment) Leg Anterior;Right;Upper 05/12/24  1225  Leg  5    Wound 05/12/24 Irritant Contact Dermatitis Perspiration Pelvis Anterior;Right Intertrigo along pannus 05/12/24  1226  Pelvis  5    Wound 05/12/24 Other (Comment) Head Posterior;Upper 05/12/24  1227  Head  5                    Goals of Care     Code Status: Full Code    Designated Healthcare Decision Maker:  Ms. Minahan current decisional capacity for healthcare decision-making is Full capacity. Her designated healthcare decision maker(s) is/are   HCDM (patient stated preference): Caudle,Ryan - Relative - [phone].      Subjective     Patient doing well this morning.  Was called out to floor team, however blood pressures were soft while she was still she was kept in the ICU.  Continues to mentate well, eating and drinking well with some urine output.  Objective     Vitals - past 24 hours  Temp:  [36.2 ??C (97.2 ??F)-36.6 ??C (97.9 ??F)] 36.3 ??C (97.3 ??F)  Pulse:  [71-90] 87  SpO2 Pulse:  [66-89] 66  Resp:  [14-22] 18  SpO2:  [92 %-99 %] 93 % Intake/Output  I/O last 3 completed shifts:  In: 1328.5 [P.O.:880; I.V.:26; IV Piggyback:422.5]  Out: 2070 [Urine:1590; Drains:480]     Physical Exam:    General: Chronically ill-appearing female, lying in bed.   HEENT: Mucous membranes dry. Sclerae icteric.   CV: RRR  Pulm: CTAB on anterior exam.  GI: Normoactive bowel sounds. Soft, distended. Non-tender to palpation  Extremities: Trace edema BLE.   Skin: Diffuse scattered erythematous plaques with silvery scale. No bruising, petechiae, mucocutaneous bleeding.  Neuro: AOx4    Continuous Infusions:         Scheduled Medications:    clobetasol    Topical BID    enoxaparin  (LOVENOX ) injection  40 mg Subcutaneous Q24H    insulin  glargine  60 Units Subcutaneous Nightly    insulin  lispro  6 Units Subcutaneous 3xd Meals    insulin  lispro  0-20 Units Subcutaneous ACHS    lactulose   20 g Oral BID    levETIRAcetam   750 mg Oral BID    midodrine   15 mg Oral TID    multivitamins (ADULT)  1 tablet Oral Daily    pantoprazole   40 mg Oral Daily    piperacillin -tazobactam  4.5 g Intravenous Q8H    sodium chloride   10 mL Intravenous Q8H    sodium chloride   10 mL Intravenous Q8H    sodium chloride   10 mL Intravenous Q8H    ursodiol  300 mg Oral BID  PRN medications:  dextrose  in water , glucagon, glucose, hydrOXYzine, melatonin, ondansetron     Data/Imaging Review: Reviewed in Epic and personally interpreted on 05/18/2024. See EMR for detailed results.    Rexine Cater, MD

## 2024-05-18 NOTE — Unmapped (Signed)
 Problem: Adult Inpatient Plan of Care  Goal: Plan of Care Review  Outcome: Progressing  Goal: Patient-Specific Goal (Individualized)  Outcome: Progressing  Goal: Absence of Hospital-Acquired Illness or Injury  Outcome: Progressing  Intervention: Identify and Manage Fall Risk  Recent Flowsheet Documentation  Taken 05/17/2024 2000 by Aleck Amis, RN  Safety Interventions:   aspiration precautions   lighting adjusted for tasks/safety   low bed  Intervention: Prevent Skin Injury  Recent Flowsheet Documentation  Taken 05/18/2024 0600 by Aleck Amis, RN  Positioning for Skin: Left  Taken 05/18/2024 0400 by Aleck Amis, RN  Positioning for Skin: Right  Taken 05/18/2024 0200 by Aleck Amis, RN  Positioning for Skin: Left  Taken 05/18/2024 0000 by Aleck Amis, RN  Positioning for Skin: Right  Taken 05/17/2024 2200 by Aleck Amis, RN  Positioning for Skin: Left  Taken 05/17/2024 2000 by Aleck Amis, RN  Positioning for Skin: Right  Intervention: Prevent Infection  Recent Flowsheet Documentation  Taken 05/17/2024 2000 by Aleck Amis, RN  Infection Prevention:   cohorting utilized   hand hygiene promoted  Goal: Optimal Comfort and Wellbeing  Outcome: Progressing  Goal: Readiness for Transition of Care  Outcome: Progressing  Goal: Rounds/Family Conference  Outcome: Progressing     Problem: Skin Injury Risk Increased  Goal: Skin Health and Integrity  Outcome: Progressing  Intervention: Optimize Skin Protection  Recent Flowsheet Documentation  Taken 05/17/2024 2000 by Aleck Amis, RN  Activity Management: bedrest  Pressure Reduction Techniques:   frequent weight shift encouraged   weight shift assistance provided   sit time limited to 2 hours  Head of Bed (HOB) Positioning: HOB at 30-45 degrees  Pressure Reduction Devices:   positioning supports utilized   pressure-redistributing mattress utilized     Problem: Fall Injury Risk  Goal: Absence of Fall and Fall-Related Injury  Outcome: Progressing  Intervention: Promote Scientist, clinical (histocompatibility and immunogenetics) Documentation  Taken 05/17/2024 2000 by Aleck Amis, RN  Safety Interventions:   aspiration precautions   lighting adjusted for tasks/safety   low bed     Problem: Non-Violent Restraints  Goal: Patient will remain free of restraint events  Outcome: Progressing  Goal: Patient will remain free of physical injury  Outcome: Progressing     Problem: Wound  Goal: Optimal Coping  Outcome: Progressing  Goal: Optimal Functional Ability  Outcome: Progressing  Intervention: Optimize Functional Ability  Recent Flowsheet Documentation  Taken 05/17/2024 2000 by Aleck Amis, RN  Activity Management: bedrest  Goal: Absence of Infection Signs and Symptoms  Outcome: Progressing  Goal: Improved Oral Intake  Outcome: Progressing  Goal: Optimal Pain Control and Function  Outcome: Progressing  Goal: Skin Health and Integrity  Outcome: Progressing  Intervention: Optimize Skin Protection  Recent Flowsheet Documentation  Taken 05/17/2024 2000 by Aleck Amis, RN  Activity Management: bedrest  Pressure Reduction Techniques:   frequent weight shift encouraged   weight shift assistance provided   sit time limited to 2 hours  Head of Bed (HOB) Positioning: HOB at 30-45 degrees  Pressure Reduction Devices:   positioning supports utilized   pressure-redistributing mattress utilized  Goal: Optimal Wound Healing  Outcome: Progressing     Problem: Self-Care Deficit  Goal: Improved Ability to Complete Activities of Daily Living  Outcome: Progressing     Problem: Mechanical Ventilation Invasive  Goal: Effective Communication  Outcome: Progressing  Goal: Optimal Device Function  Outcome: Progressing  Goal: Mechanical Ventilation Liberation  Outcome: Progressing  Goal: Optimal Nutrition Delivery  Outcome: Progressing  Goal: Absence of Device-Related Skin and Tissue Injury  Outcome: Progressing  Goal: Absence of Ventilator-Induced Lung Injury  Outcome: Progressing  Intervention: Prevent Ventilator-Associated Pneumonia  Recent Flowsheet Documentation  Taken 05/17/2024 2000 by Aleck Amis, RN  Head of Bed [facility]) Positioning: HOB at 30-45 degrees     Problem: Artificial Airway  Goal: Effective Communication  Outcome: Progressing  Goal: Optimal Device Function  Outcome: Progressing  Intervention: Optimize Device Care and Function  Recent Flowsheet Documentation  Taken 05/17/2024 2000 by Aleck Amis, RN  Aspiration Precautions:   oral hygiene care promoted   respiratory status monitored  Goal: Absence of Device-Related Skin or Tissue Injury  Outcome: Progressing     Problem: Infection  Goal: Absence of Infection Signs and Symptoms  Outcome: Progressing

## 2024-05-18 NOTE — Progress Notes (Unsigned)
 This encounter was created in error - please disregard.

## 2024-05-19 LAB — CBC W/ AUTO DIFF
BASOPHILS ABSOLUTE COUNT: 0 10*9/L (ref 0.0–0.1)
BASOPHILS RELATIVE PERCENT: 0.6 %
EOSINOPHILS ABSOLUTE COUNT: 0.1 10*9/L (ref 0.0–0.5)
EOSINOPHILS RELATIVE PERCENT: 1.3 %
HEMATOCRIT: 25.3 % — ABNORMAL LOW (ref 34.0–44.0)
HEMOGLOBIN: 8.8 g/dL — ABNORMAL LOW (ref 11.3–14.9)
LYMPHOCYTES ABSOLUTE COUNT: 1.1 10*9/L (ref 1.1–3.6)
LYMPHOCYTES RELATIVE PERCENT: 13 %
MEAN CORPUSCULAR HEMOGLOBIN CONC: 34.8 g/dL (ref 32.0–36.0)
MEAN CORPUSCULAR HEMOGLOBIN: 30.6 pg (ref 25.9–32.4)
MEAN CORPUSCULAR VOLUME: 88 fL (ref 77.6–95.7)
MONOCYTES ABSOLUTE COUNT: 1.2 10*9/L — ABNORMAL HIGH (ref 0.3–0.8)
MONOCYTES RELATIVE PERCENT: 13.1 %
NEUTROPHILS ABSOLUTE COUNT: 6.3 10*9/L (ref 1.8–7.8)
NEUTROPHILS RELATIVE PERCENT: 72 %
PLATELET COUNT: >142 10*9/L — ABNORMAL LOW (ref 150–450)
RED BLOOD CELL COUNT: 2.88 10*12/L — ABNORMAL LOW (ref 3.95–5.13)
RED CELL DISTRIBUTION WIDTH: 21.1 % — ABNORMAL HIGH (ref 12.2–15.2)
WBC ADJUSTED: 8.8 10*9/L (ref 3.6–11.2)

## 2024-05-19 LAB — SLIDE REVIEW

## 2024-05-19 LAB — COMPREHENSIVE METABOLIC PANEL
ALBUMIN: 0.9 g/dL — ABNORMAL LOW (ref 3.4–5.0)
ALKALINE PHOSPHATASE: 52 U/L (ref 46–116)
ALT (SGPT): 19 U/L (ref 10–49)
ANION GAP: 12 mmol/L (ref 5–14)
AST (SGOT): 22 U/L (ref <=34)
BILIRUBIN TOTAL: 9.1 mg/dL — ABNORMAL HIGH (ref 0.3–1.2)
BLOOD UREA NITROGEN: 18 mg/dL (ref 9–23)
BUN / CREAT RATIO: 29
CALCIUM: 4 mg/dL — CL (ref 8.7–10.4)
CHLORIDE: 121 mmol/L — ABNORMAL HIGH (ref 98–107)
CO2: 11 mmol/L — CL (ref 20.0–31.0)
CREATININE: 0.62 mg/dL (ref 0.55–1.02)
EGFR CKD-EPI (2021) FEMALE: >90 mL/min/1.73m2 (ref >=60)
GLUCOSE RANDOM: 96 mg/dL (ref 70–179)
POTASSIUM: 1.9 mmol/L — CL (ref 3.4–4.8)
PROTEIN TOTAL: <4 g/dL — ABNORMAL LOW (ref 5.7–8.2)
SODIUM: 144 mmol/L (ref 135–145)

## 2024-05-19 LAB — PHOSPHORUS: PHOSPHORUS: 1.6 mg/dL — ABNORMAL LOW (ref 2.4–5.1)

## 2024-05-19 LAB — BLOOD GAS CRITICAL CARE PANEL, ARTERIAL
BASE EXCESS ARTERIAL: -3.1 — ABNORMAL LOW (ref -2.0–2.0)
CALCIUM IONIZED ARTERIAL (MG/DL): 4.68 mg/dL (ref 4.40–5.40)
GLUCOSE WHOLE BLOOD: 202 mg/dL — ABNORMAL HIGH (ref 70–179)
HCO3 ARTERIAL: 21 mmol/L — ABNORMAL LOW (ref 22–27)
HEMOGLOBIN BLOOD GAS: 8.2 g/dL — ABNORMAL LOW (ref 12.00–16.00)
LACTATE BLOOD ARTERIAL: 1.6 mmol/L — ABNORMAL HIGH (ref <1.3)
O2 SATURATION ARTERIAL: 97.3 % (ref 94.0–100.0)
PCO2 ARTERIAL: 33.9 mmHg — ABNORMAL LOW (ref 35.0–45.0)
PH ARTERIAL: 7.41 (ref 7.35–7.45)
PO2 ARTERIAL: 84.8 mmHg (ref 80.0–110.0)
POTASSIUM WHOLE BLOOD: 3.7 mmol/L (ref 3.4–4.6)
SODIUM WHOLE BLOOD: 128 mmol/L — ABNORMAL LOW (ref 135–145)

## 2024-05-19 LAB — PROTIME-INR
INR: 1.57
PROTIME: 17.9 s — ABNORMAL HIGH (ref 9.9–12.6)

## 2024-05-19 LAB — FIBRINOGEN: FIBRINOGEN LEVEL: 152 mg/dL — ABNORMAL LOW (ref 175–500)

## 2024-05-19 LAB — VITAMIN D 25 HYDROXY: VITAMIN D, TOTAL (25OH): 9.9 ng/mL — ABNORMAL LOW (ref 20.0–80.0)

## 2024-05-19 LAB — MAGNESIUM: MAGNESIUM: 1 mg/dL — ABNORMAL LOW (ref 1.6–2.6)

## 2024-05-19 LAB — ZINC: ZINC: 34 ug/dL — ABNORMAL LOW

## 2024-05-19 MED ADMIN — midodrine (PROAMATINE) tablet 20 mg: 20 mg | ORAL | @ 22:00:00

## 2024-05-19 MED ADMIN — sodium chloride (NS) 0.9 % flush 10 mL: 10 mL | INTRAVENOUS | @ 16:00:00

## 2024-05-19 MED ADMIN — acetaminophen (TYLENOL) tablet 650 mg: 650 mg | ORAL | @ 05:00:00

## 2024-05-19 MED ADMIN — lactulose oral solution: 20 g | ORAL | @ 01:00:00

## 2024-05-19 MED ADMIN — piperacillin-tazobactam (ZOSYN) IVPB (premix) 4.5 g: 4.5 g | INTRAVENOUS | @ 02:00:00 | Stop: 2024-05-21

## 2024-05-19 MED ADMIN — pantoprazole (Protonix) EC tablet 40 mg: 40 mg | ORAL | @ 12:00:00

## 2024-05-19 MED ADMIN — midodrine (PROAMATINE) tablet 15 mg: 15 mg | ORAL | @ 12:00:00 | Stop: 2024-05-19

## 2024-05-19 MED ADMIN — enoxaparin (LOVENOX) syringe 40 mg: 40 mg | SUBCUTANEOUS | @ 12:00:00

## 2024-05-19 MED ADMIN — insulin lispro (HumaLOG) injection CORRECTIONAL 0-20 Units: 0-20 [IU] | SUBCUTANEOUS | @ 22:00:00

## 2024-05-19 MED ADMIN — midodrine (PROAMATINE) tablet 20 mg: 20 mg | ORAL | @ 17:00:00

## 2024-05-19 MED ADMIN — furosemide (LASIX) tablet 20 mg: 20 mg | ORAL | @ 16:00:00

## 2024-05-19 MED ADMIN — insulin lispro (HumaLOG) injection CORRECTIONAL 0-20 Units: 0-20 [IU] | SUBCUTANEOUS | @ 16:00:00

## 2024-05-19 MED ADMIN — ursodiol (ACTIGALL) capsule 300 mg: 300 mg | ORAL | @ 12:00:00

## 2024-05-19 MED ADMIN — insulin lispro (HumaLOG) inj PERCENTAGE MEAL EATEN 8 Units: 8 [IU] | SUBCUTANEOUS | @ 16:00:00

## 2024-05-19 MED ADMIN — insulin glargine (LANTUS) injection BASAL 60 Units: 60 [IU] | SUBCUTANEOUS | @ 01:00:00

## 2024-05-19 MED ADMIN — levETIRAcetam (KEPPRA) tablet 750 mg: 750 mg | ORAL | @ 12:00:00

## 2024-05-19 MED ADMIN — clobetasol (TEMOVATE) 0.05 % ointment: TOPICAL | @ 01:00:00

## 2024-05-19 MED ADMIN — lactulose oral solution: 20 g | ORAL | @ 12:00:00

## 2024-05-19 MED ADMIN — insulin lispro (HumaLOG) injection CORRECTIONAL 0-20 Units: 0-20 [IU] | SUBCUTANEOUS | @ 12:00:00

## 2024-05-19 MED ADMIN — multivitamins, therapeutic with minerals tablet 1 tablet: 1 | ORAL | @ 12:00:00

## 2024-05-19 MED ADMIN — insulin lispro (HumaLOG) injection CORRECTIONAL 0-20 Units: 0-20 [IU] | SUBCUTANEOUS | @ 01:00:00

## 2024-05-19 MED ADMIN — piperacillin-tazobactam (ZOSYN) IVPB (premix) 4.5 g: 4.5 g | INTRAVENOUS | @ 10:00:00 | Stop: 2024-05-21

## 2024-05-19 MED ADMIN — insulin lispro (HumaLOG) inj PERCENTAGE MEAL EATEN 8 Units: 8 [IU] | SUBCUTANEOUS | @ 22:00:00

## 2024-05-19 MED ADMIN — levETIRAcetam (KEPPRA) tablet 750 mg: 750 mg | ORAL | @ 01:00:00

## 2024-05-19 MED ADMIN — ursodiol (ACTIGALL) capsule 300 mg: 300 mg | ORAL | @ 01:00:00

## 2024-05-19 MED ADMIN — clobetasol (TEMOVATE) 0.05 % ointment: TOPICAL | @ 12:00:00

## 2024-05-19 NOTE — Unmapped (Signed)
 MICU Daily Progress Note     Date of Service: 05/19/2024    Problem List:   Principal Problem:    Shock     Active Problems:    Type 2 diabetes mellitus, with long-term current use of insulin        Hyperlipidemia    Hypertension    Coronary artery disease involving native heart without angina pectoris    Diabetes mellitus       Esophageal varices in cirrhosis       Hepatic encephalopathy       Calcified cerebral meningioma       Portal hypertension       Other chronic pain    OSA (obstructive sleep apnea)    Stage 3a chronic kidney disease (CMS-HCC)    Hematemesis      Interval history: Ashley Briggs is a 57 y.o. female with PMH CAD s/p NSTEMI, CKD, NAS cirrhosis c/b ascites, hepatic encephalopathy and EV s/p recent banding admitted with UGI bleed 2/2 bleeding esophageal ulcers s/p unsusccessful TIPS c/b pulsatile VT s/p shock requiring transfer to [facility] ICU. Course complicated by severe hemorrhagic shock 2/2 gastric bleed now s/p TIPS with course complicated by acute hypoxic respiratory failure, AKI, stress cardiomyopathy and hepatic encephalopathy. Hgb stable. Extubated 6/12 and weaned to RA. Remains in the MICU for pressor requirement.    24hr events:   - Mental status wnl  - Titrating insulin  regimen  - Continues on Zosyn  for possible aspiration  - Increase midodrine  20 mg 3 times daily    Neurological   Sedation (resolved)  Sedation held and extubated 6/12. Now A&O.     History of seizure-like activity (stable)  - Home Keppra  750mg  BID    Chronic pain  History of chronic pain, treated with baclofen , naltrexone , oxycodone .   - Hold home baclofen , naltrextone, oxycodone  in the setting of hypotension and sedation  - Will slowly reintroduce some home medications due to bothersome neuropathic pain    Major depressive disorder (stable)  - Hold home bupoprion, citalopram     Insomnia (stable)  - Hold home zolpidem     PMH calcified cerebral meningioma (stable)  - no intervention at this time    Analgesia: pain adequately controlled    Pulmonary   S/p Intubation and mechanical ventilation (resolved)  Previously intubated for procedures and altered mental status. Extubated 6/5, re-intubated later the same day for worsening hypoxia. Successfully extubated 6/12 and now weaned to RA-2L.  Was briefly on 2 L for what she describes as a panic attack.  -Wean O2 as able    Pulmonary edema (stable)  Increasing hypoxia on 6/5, ultimately requiring re-intubation with FiO2 100%. CXR demonstrated moderate pulmonary edema. Etiology of pulmonary edema most likely cardiogenic given concurrent suspected stress cardiomyopathy, though she is also at risk for ARDS and noncardiogenic pulmonary edema cannot be excluded. Diuresed with improving oxygen requirement, now on room air.   - CTM    Left pleural effusion (stable)  CXR 6/4 remarkable for moderate Left pleural effusion with passive atelectasis. S/p thoracentesis on 6/7. Pleural fluid c/w transudative effusion by protein parameters.   - CTM    Cardiovascular   Undifferentiated shock - resolved  Arrived to [facility] MICU in undifferentiated shock requiring 2 pressors. Acutely worsening hypotension on 6/4 requiring fourth pressor, aggressive fluid resuscitation, and massive transfusion protocol. S/p emergent TIPS on 6/4. Isolated fever on 6/6, prerenal AKI, and central venous O2 nearly 90 concerning for some component of septic shock, though infectious workup  remains unremarkable, and central venous O2 may be confounded in the setting of cirrhosis.  Has intermittently been on low-dose norepi since 6/14, however after switching to a SBP goal, she has been successfully weaned. Septic shock secondary to aspiration is possible for which we are covering for Zosyn , however reassuringly she remains on room air without dyspnea.  She continues to intermittently have low blood pressures while she is asleep, however suspect this is her baseline and she continues to [facility] appropriately without any evidence of poor perfusion.  Will increase midodrine .  - Increase midodrine  20mg  q8  - Titrate norepinephrine  for SBP > 90  - Abx: Zosyn  (6/14 - 6/20 )    Stress cardiomyopathy - Demand Ischemia   With elevated troponin on admission (5.4k) likely 2/2 demand ischemia in the setting of shock. Echo on admission overall normal with EF > 55%. Developed several episodes of wide complex tachycardia on 6/5. Bedside POCUS with apical ballooning and slightly decreased EF to 45-50%. EKG was without acute ischemic changes. Troponin peaked at 23k (6/6). Formal TTE demonstrated reduced EF to 45% with basal and midventricular hypokinesis. Findings favored to represent stress cardiomyopathy rather than ACS in the absence of acute EKG changes. Repeat TTE 6/10 with EF 40%, marginally decreased from prior.   -Repeat TTE in 3 months    Wide complex tachycardia - Hx ventricular tachycardia  Developed VT arrest requiring shock at OSH with initial TIPS attempt. On 6/5 developed several episodes of wide complex tachycardia. EKG with tachycardia to 130s, thought by cardiology to represent a fib with abberancy rather than VT. She was stabilized with amiodarone  bolus, followed by amiodarone  gtt and then PO amio. Finished amio load 6/12, no role for amio moving forward as this was precipitated iso critical illness.   - Daily CMP  - Replete K>4, Mg>2    CAD, prior NSTEMI  Prior NSTEMI per chart review. No LHC data available. Unclear if she has history of coronary stent.   - Home aspirin  held ISO bleeding    Renal   Prerenal AKI (resolved)  Cr elevated to 1.32 from baseline ~0.8. Urine lytes most consistent with prerenal etiology, 2/2 hypoperfusion iso shock, unlikely HRS. Cr normalized, adequate UOP.  - Daily CMP    Infectious Disease/Autoimmune   Aspiration pneumonia  Presented to [facility] MICU in 2-pressor shock with leukocytosis to 24.9, infectious source never identified.. With fever to 38.4 on 6/6. Paracentesis with PMNs < 250. Thoracentesis transudative. UA noninfectious. Lrcx gram stain with 1+ yeast, 1+ GPC. Fluctuating single pressor requirement since 6/9, repeat infectious workup negative. Had possible aspiration event overnight into 6/15 for which Zosyn  was resumed, she is clinically improved since starting antibiotics, anticipate full treatment course for aspiration pneumonia for 7 days..  - Bcx ngtd  - Fu repeat LRCx  - Zosyn  (6/14 - 6/20)    Cultures:  Blood Culture, Routine (no units)   Date Value   05/14/2024 No Growth at 4 days   05/14/2024 No Growth at 4 days     Urine Culture, Comprehensive (no units)   Date Value   05/14/2024 10,000 to 50,000 CFU/mL Candida glabrata (A)   05/04/2024 NO GROWTH     Lower Respiratory Culture (no units)   Date Value   05/07/2024 OROPHARYNGEAL FLORA ISOLATED   05/06/2024 Specimen Not Processed     WBC (10*9/L)   Date Value   05/19/2024 8.8     WBC, UA (/HPF)   Date Value   05/10/2024 25 (H)  FEN/GI   Decompensated cirrhosis c/b esophageal varices, gastric varix, hepatic encephalopathy, ascites   Presenting with hematemesis 6 days after esophageal banding. Emergent TIPS at OSH not successful d/t inability to access the portal vein. EGD on 6/4 demonstrated nonbleeding esophageal ulcers at site of prior banding, stomach completely occluded by blood which could not be cleared endoscopically. With acute decline in Hb and hypotension on 6/4. Darius Edouard attempted with GI, but unsuccessful after numerous attempts. S/p salvage TIPS on 6/4 with no further GI bleeding. Will slowly resume home diuretic to avoid worsening volume overload.  - Hepatology following  - Daily MELLD labs (CMP, PT/INR)  - Maintain fibrinogen > 100 with cryo  - Home spironolactone  held d/t hypotension   -Start p.o. Lasix  20 mg  - Continue rifaximin , continue lactulose  20g BID for goal 3-5 Bms daily    Acute on chronic liver failure - Ischemic hepatitis (improving)  LFTs continue to rise s/p variceal hemorrhage and TIPS. Unfortunately, precluded from transplant eval with newly reduced EF. LFTs slowly improving, suggesting ongoing recovery from TIPS and hemorrhagic event.  - Hepatology following    Malnutrition Assessment:   Tube feeds started 6/9.  Passed swallow study, on regular diet and eating well.  - Continue full diet       Heme/Coag   Blood loss anemia, likely 2/2 refractory portal hypertensive bleeding (resolved)  S/p 3 units pRBCs at OSH, Hb 9.3 on arrival to MICU. S/p massive transfusion protocol on 6/4. Low clinical concern for ongoing blood loss with stable Hb, last transfusion 6/4.   - Daily CBC    Mild Thrombocytopenia (stable)  Plts 253k on admission, acute decline to 50k on 6/5. Currently without signs of active bleeding. Plts remain stable around ~100-150k.  - Daily CBC    Endocrine   Insulin -dependent T2DM  A1c 5.6 on admission. Home regimen lantus  60, lispro with meals. BG control challenging in the setting of critical illness and stress dose steroids. S/p insulin  infusion, converted to NPH 6/8.  Continuing to titrate insulin  while off continuous tube feeds and p.o. only.  Will consolidate NPH to glargine 60 units nightly (home dose) and restart nutritional insulin  as well.  - Glargine 50 units nightly  - Continue lispro 8u TIDAC  - Lispro SSI QID    Integumentary   Psoriasis (stable)  History of psoriasis per chart review. With numerous chronic appearing scattered erythematous plaques with silvery scale. Of note, on Secukinumab  for psoriasis management, which can increase risk for infection.  -Continue clobetasol      # Irritant contact dermatitis (buttocks)  # Wound, R upper anterior leg  # Irritant contact dermatitis (anterior pelvis)  # Intertrigo along pannus  # Scalp wound 2/2 psoriasis  - WOCN consulted for high risk skin assessment Yes.  - Recommendations per WOCN note:  WOCN Recommendations:   - See nursing orders for wound care instructions.  - Contact WOCN with questions, concerns, or wound deterioration.  Topical Therapy/Interventions:   - Absorbent Dressing  - Antimicrobial Solutions  - Crusting (stoma powder or antifungal powder)  - Hydrofiber  - Zinc oxide barrier cream      Prophylaxis/LDA/Restraints/Consults   ICU checklist completed: yes (see ICU rounding navigator in Epic)    Patient Lines/Drains/Airways Status       Active Active Lines, Drains, & Airways       Name Placement date Placement time Site Days    Open Drain Inferior;Lateral;Right Back 05/14/24  1357  Back  5    Peripheral  IV 05/04/24 Anterior;Left;Upper Arm 05/04/24  0500  Arm  15    Peripheral IV 05/04/24 Anterior;Right;Upper Arm 05/04/24  0500  Arm  15                  Patient Lines/Drains/Airways Status       Active Wounds       Name Placement date Placement time Site Days    Surgical Site 05/04/24 Abdomen Lower;Right 05/04/24  2300  -- 14    Surgical Site 05/04/24 Neck Right 05/04/24  2300  -- 14    Surgical Site 05/04/24 Groin Right 05/04/24  2300  -- 14    Wound 05/12/24 Irritant Contact Dermatitis Rash/Dermatitis Buttocks 05/12/24  1220  Buttocks  7    Wound 05/12/24 Other (Comment) Leg Anterior;Right;Upper 05/12/24  1225  Leg  7    Wound 05/12/24 Irritant Contact Dermatitis Perspiration Pelvis Anterior;Right Intertrigo along pannus 05/12/24  1226  Pelvis  7    Wound 05/12/24 Other (Comment) Head Posterior;Upper 05/12/24  1227  Head  7                    Goals of Care     Code Status: Full Code    Designated Healthcare Decision Maker:  Ashley Briggs current decisional capacity for healthcare decision-making is Full capacity. Her designated healthcare decision maker(s) is/are   HCDM (patient stated preference): Caudle,Ryan - Relative - [phone].      Subjective     Patient doing well this morning.  Mentation remains good at her baseline.  Eating and drinking well.  Denies any pain or shortness of breath.     Vitals - past 24 hours  Temp:  [35.1 ??C (95.2 ??F)-36.2 ??C (97.2 ??F)] 35.4 ??C (95.7 ??F)  Pulse:  [63-84] 70  SpO2 Pulse:  [61-84] 70  Resp: [10-25] 15  BP: (61-117)/(23-53) 76/33  SpO2:  [93 %-100 %] 96 % Intake/Output  I/O last 3 completed shifts:  In: 1282.5 [P.O.:850; I.V.:30; IV Piggyback:402.5]  Out: 1111 [Urine:741; Drains:370]     Physical Exam:    General: Chronically ill-appearing female, lying in bed.   HEENT: Mucous membranes dry. Sclerae icteric.   CV: RRR  Pulm: CTAB on anterior exam.  GI: Normoactive bowel sounds. Soft, distended. Non-tender to palpation  Extremities: Trace edema BLE.   Skin: Diffuse scattered erythematous plaques with silvery scale. No bruising, petechiae, mucocutaneous bleeding.  Neuro: AOx4    Continuous Infusions:         Scheduled Medications:    clobetasol    Topical BID    enoxaparin  (LOVENOX ) injection  40 mg Subcutaneous Q24H    [START ON 05/20/2024] ergocalciferol-1,250 mcg (50,000 unit)  1,250 mcg Oral Weekly    furosemide   20 mg Oral Daily    insulin  glargine  50 Units Subcutaneous Nightly    insulin  lispro  8 Units Subcutaneous 3xd Meals    insulin  lispro  0-20 Units Subcutaneous ACHS    lactulose   20 g Oral BID    levETIRAcetam   750 mg Oral BID    midodrine   20 mg Oral TID    multivitamins (ADULT)  1 tablet Oral Daily    pantoprazole   40 mg Oral Daily    piperacillin -tazobactam  4.5 g Intravenous Q8H    sodium chloride   10 mL Intravenous Q8H    sodium chloride   10 mL Intravenous Q8H    sodium chloride   10 mL Intravenous Q8H    ursodiol  300 mg Oral BID    [  START ON 05/20/2024] zinc sulfate  220 mg Oral Daily       PRN medications:  acetaminophen , dextrose  in water , glucagon, glucose, hydrOXYzine, melatonin, ondansetron     Data/Imaging Review: Reviewed in Epic and personally interpreted on 05/19/2024. See EMR for detailed results.    Rexine Cater, MD

## 2024-05-19 NOTE — Unmapped (Addendum)
 A&Ox4.  Afebrile.  SBP >85.  NSR.  1L [place].  Diminished urine output, MD aware.  Tylenol  given 1x prn for headache.  3 Bms this shift.  CHG given.  Family called and updated.    Problem: Adult Inpatient Plan of Care  Goal: Plan of Care Review  Outcome: Progressing  Goal: Patient-Specific Goal (Individualized)  Outcome: Progressing  Goal: Absence of Hospital-Acquired Illness or Injury  Outcome: Progressing  Intervention: Identify and Manage Fall Risk  Recent Flowsheet Documentation  Taken 05/19/2024 0400 by Maryan Smalling, RN  Safety Interventions: aspiration precautions  Taken 05/19/2024 0200 by Maryan Smalling, RN  Safety Interventions: aspiration precautions  Taken 05/19/2024 0000 by Maryan Smalling, RN  Safety Interventions: aspiration precautions  Taken 05/18/2024 2200 by Maryan Smalling, RN  Safety Interventions: aspiration precautions  Taken 05/18/2024 2000 by Maryan Smalling, RN  Safety Interventions: aspiration precautions  Intervention: Prevent Skin Injury  Recent Flowsheet Documentation  Taken 05/19/2024 0400 by Maryan Smalling, RN  Positioning for Skin: Right  Taken 05/19/2024 0200 by Maryan Smalling, RN  Positioning for Skin: Left  Taken 05/19/2024 0000 by Maryan Smalling, RN  Positioning for Skin: Right  Taken 05/18/2024 2200 by Maryan Smalling, RN  Positioning for Skin: Left  Taken 05/18/2024 2000 by Maryan Smalling, RN  Positioning for Skin: Right  Intervention: Prevent and Manage VTE (Venous Thromboembolism) Risk  Recent Flowsheet Documentation  Taken 05/19/2024 0400 by Maryan Smalling, RN  Anti-Embolism Device Status: Other (Comment)  Taken 05/19/2024 0000 by Maryan Smalling, RN  Anti-Embolism Device Status: Other (Comment)  Taken 05/18/2024 2000 by Maryan Smalling, RN  Anti-Embolism Device Status: Other (Comment)  Intervention: Prevent Infection  Recent Flowsheet Documentation  Taken 05/19/2024 0400 by Maryan Smalling, RN  Infection Prevention: cohorting utilized  Taken 05/19/2024 0200 by Maryan Smalling, RN  Infection Prevention: cohorting utilized  Taken 05/19/2024 0000 by Maryan Smalling, RN  Infection Prevention: cohorting utilized  Taken 05/18/2024 2200 by Maryan Smalling, RN  Infection Prevention: cohorting utilized  Taken 05/18/2024 2000 by Maryan Smalling, RN  Infection Prevention: cohorting utilized  Goal: Optimal Comfort and Wellbeing  Outcome: Progressing  Goal: Readiness for Transition of Care  Outcome: Progressing  Goal: Rounds/Family Conference  Outcome: Progressing

## 2024-05-19 NOTE — Unmapped (Signed)
 Patient neurologically intact. Continues to be hypotensive with SBP in 80's but is asymptomatic. Afebrile. Requests 1 L O2 via nasal cannula despite O2 sat's >93%. UOP 290 ml/12 hours. One bowel movement. Wound care provided per orders. Daughter updated on condition and POC by phone. On stepdown status. See Doc Flowsheet for detailed physical assessment, vital signs, and lab results.    Problem: Adult Inpatient Plan of Care  Goal: Plan of Care Review  05/19/2024 1835 by Jean Michaelis, RN  Outcome: Progressing  Flowsheets (Taken 05/19/2024 1835)  Progress: improving  Plan of Care Reviewed With: patient  05/19/2024 1829 by Jean Michaelis, RN  Outcome: Progressing  Goal: Patient-Specific Goal (Individualized)  05/19/2024 1835 by Jean Michaelis, RN  Outcome: Progressing  05/19/2024 1829 by Jean Michaelis, RN  Outcome: Progressing  Goal: Absence of Hospital-Acquired Illness or Injury  05/19/2024 1835 by Jean Michaelis, RN  Outcome: Progressing  05/19/2024 1829 by Jean Michaelis, RN  Outcome: Progressing  Intervention: Identify and Manage Fall Risk  Recent Flowsheet Documentation  Taken 05/19/2024 0800 by Jean Michaelis, RN  Safety Interventions: aspiration precautions  Intervention: Prevent Skin Injury  Recent Flowsheet Documentation  Taken 05/19/2024 1800 by Jean Michaelis, RN  Positioning for Skin: Right  Taken 05/19/2024 1600 by Jean Michaelis, RN  Positioning for Skin: Left  Taken 05/19/2024 1400 by Jean Michaelis, RN  Positioning for Skin: Right  Taken 05/19/2024 1200 by Jean Michaelis, RN  Positioning for Skin: Left  Taken 05/19/2024 1000 by Jean Michaelis, RN  Positioning for Skin: Right  Taken 05/19/2024 0800 by Jean Michaelis, RN  Positioning for Skin: Left  Intervention: Prevent and Manage VTE (Venous Thromboembolism) Risk  Recent Flowsheet Documentation  Taken 05/19/2024 1800 by Jean Michaelis, RN  Anti-Embolism Device Status: Other (Comment)  Taken 05/19/2024 1600 by Jean Michaelis, RN  Anti-Embolism Device Status: Other (Comment)  Taken 05/19/2024 1400 by Jean Michaelis, RN  Anti-Embolism Device Status: Other (Comment)  Taken 05/19/2024 1200 by Jean Michaelis, RN  Anti-Embolism Device Status: Other (Comment)  Taken 05/19/2024 1000 by Jean Michaelis, RN  Anti-Embolism Device Status: Other (Comment)  Taken 05/19/2024 0800 by Jean Michaelis, RN  Anti-Embolism Device Status: Other (Comment)  Intervention: Prevent Infection  Recent Flowsheet Documentation  Taken 05/19/2024 0800 by Jean Michaelis, RN  Infection Prevention: cohorting utilized  Goal: Optimal Comfort and Wellbeing  05/19/2024 1835 by Jean Michaelis, RN  Outcome: Progressing  05/19/2024 1829 by Jean Michaelis, RN  Outcome: Progressing  Goal: Readiness for Transition of Care  05/19/2024 1835 by Jean Michaelis, RN  Outcome: Progressing  05/19/2024 1829 by Jean Michaelis, RN  Outcome: Progressing  Goal: Rounds/Family Conference  05/19/2024 1835 by Jean Michaelis, RN  Outcome: Progressing  05/19/2024 1829 by Jean Michaelis, RN  Outcome: Progressing     Problem: Skin Injury Risk Increased  Goal: Skin Health and Integrity  05/19/2024 1835 by Jean Michaelis, RN  Outcome: Progressing  05/19/2024 1829 by Jean Michaelis, RN  Outcome: Progressing  Intervention: Optimize Skin Protection  Recent Flowsheet Documentation  Taken 05/19/2024 1800 by Jean Michaelis, RN  Head of Bed [facility]) Positioning: HOB at 30-45 degrees  Taken 05/19/2024 1600 by Jean Michaelis, RN  Head of Bed [facility]) Positioning: HOB at 30-45 degrees  Taken 05/19/2024 1400 by Jean Michaelis, RN  Head of Bed [facility]) Positioning: HOB at 30-45 degrees  Taken 05/19/2024 1200 by Jean Michaelis, RN  Head of Bed [facility]) Positioning: HOB at 30-45 degrees  Taken 05/19/2024 1000 by Jean Michaelis, RN  Head of Bed [facility]) Positioning: HOB at 30-45 degrees  Taken 05/19/2024 0800 by Jean Michaelis, RN  Pressure Reduction Techniques: heels elevated off bed  Head of Bed (HOB) Positioning: HOB at 30-45 degrees     Problem: Fall Injury Risk  Goal: Absence of Fall and Fall-Related Injury  05/19/2024 1835 by Jean Michaelis, RN  Outcome: Progressing  05/19/2024 1829 by Jean Michaelis, RN  Outcome: Progressing  Intervention: Promote Injury-Free Environment  Recent Flowsheet Documentation  Taken 05/19/2024 0800 by Jean Michaelis, RN  Safety Interventions: aspiration precautions     Problem: Non-Violent Restraints  Goal: Patient will remain free of restraint events  05/19/2024 1835 by Jean Michaelis, RN  Outcome: Progressing  05/19/2024 1829 by Jean Michaelis, RN  Outcome: Progressing  Goal: Patient will remain free of physical injury  05/19/2024 1835 by Jean Michaelis, RN  Outcome: Progressing  05/19/2024 1829 by Jean Michaelis, RN  Outcome: Progressing     Problem: Wound  Goal: Optimal Coping  05/19/2024 1835 by Jean Michaelis, RN  Outcome: Progressing  05/19/2024 1829 by Jean Michaelis, RN  Outcome: Progressing  Goal: Optimal Functional Ability  05/19/2024 1835 by Jean Michaelis, RN  Outcome: Progressing  05/19/2024 1829 by Jean Michaelis, RN  Outcome: Progressing  Goal: Absence of Infection Signs and Symptoms  05/19/2024 1835 by Jean Michaelis, RN  Outcome: Progressing  05/19/2024 1829 by Jean Michaelis, RN  Outcome: Progressing  Intervention: Prevent or Manage Infection  Recent Flowsheet Documentation  Taken 05/19/2024 0800 by Jean Michaelis, RN  Infection Management: aseptic technique maintained  Goal: Improved Oral Intake  05/19/2024 1835 by Jean Michaelis, RN  Outcome: Progressing  05/19/2024 1829 by Jean Michaelis, RN  Outcome: Progressing  Goal: Optimal Pain Control and Function  05/19/2024 1835 by Jean Michaelis, RN  Outcome: Progressing  05/19/2024 1829 by Jean Michaelis, RN  Outcome: Progressing  Goal: Skin Health and Integrity  05/19/2024 1835 by Jean Michaelis, RN  Outcome: Progressing  05/19/2024 1829 by Jean Michaelis, RN  Outcome: Progressing  Intervention: Optimize Skin Protection  Recent Flowsheet Documentation  Taken 05/19/2024 1800 by Jean Michaelis, RN  Head of Bed [facility]) Positioning: HOB at 30-45 degrees  Taken 05/19/2024 1600 by Jean Michaelis, RN  Head of Bed [facility]) Positioning: HOB at 30-45 degrees  Taken 05/19/2024 1400 by Jean Michaelis, RN  Head of Bed [facility]) Positioning: HOB at 30-45 degrees  Taken 05/19/2024 1200 by Jean Michaelis, RN  Head of Bed [facility]) Positioning: HOB at 30-45 degrees  Taken 05/19/2024 1000 by Jean Michaelis, RN  Head of Bed [facility]) Positioning: HOB at 30-45 degrees  Taken 05/19/2024 0800 by Jean Michaelis, RN  Pressure Reduction Techniques: heels elevated off bed  Head of Bed [facility]) Positioning: HOB at 30-45 degrees  Goal: Optimal Wound Healing  05/19/2024 1835 by Jean Michaelis, RN  Outcome: Progressing  05/19/2024 1829 by Jean Michaelis, RN  Outcome: Progressing     Problem: Self-Care Deficit  Goal: Improved Ability to Complete Activities of Daily Living  05/19/2024 1835 by Jean Michaelis, RN  Outcome: Progressing  05/19/2024 1829 by Jean Michaelis, RN  Outcome: Progressing     Problem: Mechanical Ventilation Invasive  Goal: Effective  Communication  05/19/2024 1835 by Jean Michaelis, RN  Outcome: Progressing  05/19/2024 1829 by Jean Michaelis, RN  Outcome: Progressing  Goal: Optimal Device Function  05/19/2024 1835 by Jean Michaelis, RN  Outcome: Progressing  05/19/2024 1829 by Jean Michaelis, RN  Outcome: Progressing  Goal: Mechanical Ventilation Liberation  05/19/2024 1835 by Jean Michaelis, RN  Outcome: Progressing  05/19/2024 1829 by Jean Michaelis, RN  Outcome: Progressing  Goal: Optimal Nutrition Delivery  05/19/2024 1835 by Jean Michaelis, RN  Outcome: Progressing  05/19/2024 1829 by Jean Michaelis, RN  Outcome: Progressing  Goal: Absence of Device-Related Skin and Tissue Injury  05/19/2024 1835 by Jean Michaelis, RN  Outcome: Progressing  05/19/2024 1829 by Jean Michaelis, RN  Outcome: Progressing  Goal: Absence of Ventilator-Induced Lung Injury  05/19/2024 1835 by Jean Michaelis, RN  Outcome: Progressing  05/19/2024 1829 by Jean Michaelis, RN  Outcome: Progressing  Intervention: Prevent Ventilator-Associated Pneumonia  Recent Flowsheet Documentation  Taken 05/19/2024 1800 by Jean Michaelis, RN  Head of Bed [facility]) Positioning: HOB at 30-45 degrees  Taken 05/19/2024 1600 by Jean Michaelis, RN  Head of Bed [facility]) Positioning: HOB at 30-45 degrees  Taken 05/19/2024 1400 by Jean Michaelis, RN  Head of Bed [facility]) Positioning: HOB at 30-45 degrees  Taken 05/19/2024 1200 by Jean Michaelis, RN  Head of Bed [facility]) Positioning: HOB at 30-45 degrees  Taken 05/19/2024 1000 by Jean Michaelis, RN  Head of Bed [facility]) Positioning: HOB at 30-45 degrees  Taken 05/19/2024 0800 by Jean Michaelis, RN  Head of Bed [facility]) Positioning: HOB at 30-45 degrees     Problem: Artificial Airway  Goal: Effective Communication  05/19/2024 1835 by Jean Michaelis, RN  Outcome: Progressing  05/19/2024 1829 by Jean Michaelis, RN  Outcome: Progressing  Goal: Optimal Device Function  05/19/2024 1835 by Jean Michaelis, RN  Outcome: Progressing  05/19/2024 1829 by Jean Michaelis, RN  Outcome: Progressing  Intervention: Optimize Device Care and Function  Recent Flowsheet Documentation  Taken 05/19/2024 0800 by Jean Michaelis, RN  Aspiration Precautions: oral hygiene care promoted  Goal: Absence of Device-Related Skin or Tissue Injury  05/19/2024 1835 by Jean Michaelis, RN  Outcome: Progressing  05/19/2024 1829 by Jean Michaelis, RN  Outcome: Progressing     Problem: Infection  Goal: Absence of Infection Signs and Symptoms  05/19/2024 1835 by Jean Michaelis, RN  Outcome: Progressing  05/19/2024 1829 by Jean Michaelis, RN  Outcome: Progressing  Intervention: Prevent or Manage Infection  Recent Flowsheet Documentation  Taken 05/19/2024 0800 by Jean Michaelis, RN  Infection Management: aseptic technique maintained

## 2024-05-20 LAB — CBC W/ AUTO DIFF
BASOPHILS ABSOLUTE COUNT: 0 10*9/L (ref 0.0–0.1)
BASOPHILS RELATIVE PERCENT: 0.3 %
EOSINOPHILS ABSOLUTE COUNT: 0.1 10*9/L (ref 0.0–0.5)
EOSINOPHILS RELATIVE PERCENT: 1.5 %
HEMATOCRIT: 30.1 % — ABNORMAL LOW (ref 34.0–44.0)
HEMOGLOBIN: 10.3 g/dL — ABNORMAL LOW (ref 11.3–14.9)
LYMPHOCYTES ABSOLUTE COUNT: 0.6 10*9/L — ABNORMAL LOW (ref 1.1–3.6)
LYMPHOCYTES RELATIVE PERCENT: 7.7 %
MEAN CORPUSCULAR HEMOGLOBIN CONC: 34.1 g/dL (ref 32.0–36.0)
MEAN CORPUSCULAR HEMOGLOBIN: 30.6 pg (ref 25.9–32.4)
MEAN CORPUSCULAR VOLUME: 89.7 fL (ref 77.6–95.7)
MEAN PLATELET VOLUME: 8.7 fL (ref 6.8–10.7)
MONOCYTES ABSOLUTE COUNT: 1 10*9/L — ABNORMAL HIGH (ref 0.3–0.8)
MONOCYTES RELATIVE PERCENT: 13.9 %
NEUTROPHILS ABSOLUTE COUNT: 5.8 10*9/L (ref 1.8–7.8)
NEUTROPHILS RELATIVE PERCENT: 76.6 %
PLATELET COUNT: 145 10*9/L — ABNORMAL LOW (ref 150–450)
RED BLOOD CELL COUNT: 3.36 10*12/L — ABNORMAL LOW (ref 3.95–5.13)
RED CELL DISTRIBUTION WIDTH: 21.8 % — ABNORMAL HIGH (ref 12.2–15.2)
WBC ADJUSTED: 7.5 10*9/L (ref 3.6–11.2)

## 2024-05-20 LAB — COMPREHENSIVE METABOLIC PANEL
ALBUMIN: 2.2 g/dL — ABNORMAL LOW (ref 3.4–5.0)
ALKALINE PHOSPHATASE: 131 U/L — ABNORMAL HIGH (ref 46–116)
ALT (SGPT): 41 U/L (ref 10–49)
ANION GAP: 13 mmol/L (ref 5–14)
AST (SGOT): 48 U/L — ABNORMAL HIGH (ref <=34)
BILIRUBIN TOTAL: 21.8 mg/dL — ABNORMAL HIGH (ref 0.3–1.2)
CALCIUM: 8.5 mg/dL — ABNORMAL LOW (ref 8.7–10.4)
CHLORIDE: 96 mmol/L — ABNORMAL LOW (ref 98–107)
CO2: 21 mmol/L (ref 20.0–31.0)
CREATININE: 1.36 mg/dL — ABNORMAL HIGH (ref 0.55–1.02)
EGFR CKD-EPI (2021) FEMALE: 45 mL/min/{1.73_m2} — ABNORMAL LOW (ref >=60)
GLUCOSE RANDOM: 175 mg/dL (ref 70–179)
POTASSIUM: 3.5 mmol/L (ref 3.4–4.8)
SODIUM: 130 mmol/L — ABNORMAL LOW (ref 135–145)

## 2024-05-20 LAB — BASIC METABOLIC PANEL
ANION GAP: 11 mmol/L (ref 5–14)
CALCIUM: 8.6 mg/dL — ABNORMAL LOW (ref 8.7–10.4)
CHLORIDE: 97 mmol/L — ABNORMAL LOW (ref 98–107)
CO2: 20 mmol/L (ref 20.0–31.0)
CREATININE: 1.34 mg/dL — ABNORMAL HIGH (ref 0.55–1.02)
EGFR CKD-EPI (2021) FEMALE: 46 mL/min/{1.73_m2} — ABNORMAL LOW (ref >=60)
GLUCOSE RANDOM: 207 mg/dL — ABNORMAL HIGH (ref 70–179)
POTASSIUM: 3.6 mmol/L (ref 3.4–4.8)
SODIUM: 128 mmol/L — ABNORMAL LOW (ref 135–145)

## 2024-05-20 LAB — FIBRINOGEN: FIBRINOGEN LEVEL: 169 mg/dL — ABNORMAL LOW (ref 175–500)

## 2024-05-20 LAB — CYSTATIN C
CYSTATIN C: 2.73 mg/L — ABNORMAL HIGH (ref 0.64–1.23)
EGFR CKD-EPI (2012) CYSTATIN C FEMALE: 19 mL/min/{1.73_m2} — ABNORMAL LOW (ref >=60)

## 2024-05-20 LAB — PHOSPHORUS: PHOSPHORUS: 3.6 mg/dL (ref 2.4–5.1)

## 2024-05-20 LAB — CREATININE, URINE: CREATININE, URINE: 68.8 mg/dL

## 2024-05-20 LAB — PROTIME-INR
INR: 1.43
PROTIME: 16.3 s — ABNORMAL HIGH (ref 9.9–12.6)

## 2024-05-20 LAB — MAGNESIUM: MAGNESIUM: 2 mg/dL (ref 1.6–2.6)

## 2024-05-20 LAB — POTASSIUM, URINE, RANDOM: POTASSIUM URINE: 62.8 mmol/L

## 2024-05-20 LAB — PROTEIN / CREATININE RATIO, URINE
CREATININE, URINE: 68.7 mg/dL
PROTEIN URINE: 57.9 mg/dL
PROTEIN/CREAT RATIO, URINE: 0.843

## 2024-05-20 LAB — SODIUM, URINE, RANDOM: SODIUM URINE: <10 mmol/L

## 2024-05-20 MED ADMIN — insulin lispro (HumaLOG) injection CORRECTIONAL 0-20 Units: 0-20 [IU] | SUBCUTANEOUS | @ 02:00:00

## 2024-05-20 MED ADMIN — sodium chloride (NS) 0.9 % flush 10 mL: 10 mL | INTRAVENOUS

## 2024-05-20 MED ADMIN — piperacillin-tazobactam (ZOSYN) IVPB (premix) 4.5 g: 4.5 g | INTRAVENOUS | @ 02:00:00 | Stop: 2024-05-21

## 2024-05-20 MED ADMIN — pantoprazole (Protonix) EC tablet 40 mg: 40 mg | ORAL | @ 14:00:00

## 2024-05-20 MED ADMIN — lactulose oral solution: 20 g | ORAL | @ 14:00:00

## 2024-05-20 MED ADMIN — ursodiol (ACTIGALL) capsule 300 mg: 300 mg | ORAL | @ 14:00:00

## 2024-05-20 MED ADMIN — midodrine (PROAMATINE) tablet 20 mg: 20 mg | ORAL | @ 22:00:00

## 2024-05-20 MED ADMIN — clobetasol (TEMOVATE) 0.05 % ointment: TOPICAL | @ 02:00:00

## 2024-05-20 MED ADMIN — ondansetron (ZOFRAN) injection 4 mg: 4 mg | INTRAVENOUS | @ 02:00:00

## 2024-05-20 MED ADMIN — potassium chloride ER tablet 40 mEq: 40 meq | ORAL | @ 14:00:00 | Stop: 2024-05-20

## 2024-05-20 MED ADMIN — acetaminophen (TYLENOL) tablet 650 mg: 650 mg | ORAL | @ 18:00:00

## 2024-05-20 MED ADMIN — piperacillin-tazobactam (ZOSYN) IVPB (premix) 4.5 g: 4.5 g | INTRAVENOUS | @ 10:00:00 | Stop: 2024-05-21

## 2024-05-20 MED ADMIN — ursodiol (ACTIGALL) capsule 300 mg: 300 mg | ORAL | @ 02:00:00

## 2024-05-20 MED ADMIN — insulin lispro (HumaLOG) injection CORRECTIONAL 0-20 Units: 0-20 [IU] | SUBCUTANEOUS | @ 21:00:00

## 2024-05-20 MED ADMIN — midodrine (PROAMATINE) tablet 20 mg: 20 mg | ORAL | @ 18:00:00

## 2024-05-20 MED ADMIN — levETIRAcetam (KEPPRA) tablet 750 mg: 750 mg | ORAL | @ 02:00:00

## 2024-05-20 MED ADMIN — piperacillin-tazobactam (ZOSYN) IVPB (premix) 4.5 g: 4.5 g | INTRAVENOUS | @ 18:00:00 | Stop: 2024-05-21

## 2024-05-20 MED ADMIN — midodrine (PROAMATINE) tablet 20 mg: 20 mg | ORAL | @ 14:00:00

## 2024-05-20 MED ADMIN — albumin human 25 % 50 g: 50 g | INTRAVENOUS | @ 14:00:00 | Stop: 2024-05-20

## 2024-05-20 MED ADMIN — lactulose oral solution: 20 g | ORAL | @ 02:00:00

## 2024-05-20 MED ADMIN — clobetasol (TEMOVATE) 0.05 % ointment: TOPICAL | @ 14:00:00

## 2024-05-20 MED ADMIN — levETIRAcetam (KEPPRA) tablet 750 mg: 750 mg | ORAL | @ 14:00:00

## 2024-05-20 MED ADMIN — multivitamins, therapeutic with minerals tablet 1 tablet: 1 | ORAL | @ 14:00:00

## 2024-05-20 MED ADMIN — insulin glargine (LANTUS) injection BASAL 50 Units: 50 [IU] | SUBCUTANEOUS | @ 02:00:00

## 2024-05-20 MED ADMIN — enoxaparin (LOVENOX) syringe 40 mg: 40 mg | SUBCUTANEOUS | @ 14:00:00

## 2024-05-20 MED ADMIN — acetaminophen (TYLENOL) tablet 650 mg: 650 mg | ORAL | @ 05:00:00

## 2024-05-20 NOTE — Unmapped (Deleted)
 MICU Daily Progress Note     Date of Service: 05/20/2024    Problem List:   Principal Problem:    Shock     Active Problems:    Type 2 diabetes mellitus, with long-term current use of insulin        Hyperlipidemia    Hypertension    Coronary artery disease involving native heart without angina pectoris    Diabetes mellitus       Esophageal varices in cirrhosis       Hepatic encephalopathy       Calcified cerebral meningioma       Portal hypertension       Other chronic pain    OSA (obstructive sleep apnea)    Stage 3a chronic kidney disease (CMS-HCC)    Hematemesis      Interval history: Ashley Briggs is a 58 y.o. female with PMH CAD s/p NSTEMI, CKD, NAS cirrhosis c/b ascites, hepatic encephalopathy and EV s/p recent banding admitted with UGI bleed 2/2 bleeding esophageal ulcers s/p unsusccessful TIPS c/b pulsatile VT s/p shock requiring transfer to [facility] ICU. Course complicated by severe hemorrhagic shock 2/2 gastric bleed now s/p TIPS with course complicated by acute hypoxic respiratory failure, AKI, stress cardiomyopathy and hepatic encephalopathy. Hgb stable. Extubated 6/12 and weaned to RA. Remains in the MICU for pressor requirement.    24hr events:   - Mental status wnl  - Titrating insulin  regimen  - Continues on Zosyn  for possible aspiration  - Increase midodrine  20 mg 3 times daily    Neurological   Sedation (resolved)  Sedation held and extubated 6/12. Now A&O.     History of seizure-like activity (stable)  - Home Keppra  750mg  BID    Chronic pain  History of chronic pain, treated with baclofen , naltrexone , oxycodone .   - Hold home baclofen , naltrextone, oxycodone  in the setting of hypotension and sedation  - Will slowly reintroduce some home medications due to bothersome neuropathic pain    Major depressive disorder (stable)  - Hold home bupoprion, citalopram     Insomnia (stable)  - Hold home zolpidem     PMH calcified cerebral meningioma (stable)  - no intervention at this time    Analgesia: pain adequately controlled    Pulmonary   S/p Intubation and mechanical ventilation (resolved)  Previously intubated for procedures and altered mental status. Extubated 6/5, re-intubated later the same day for worsening hypoxia. Successfully extubated 6/12 and now weaned to RA-2L.  Was briefly on 2 L for what she describes as a panic attack.  -Wean O2 as able    Pulmonary edema (stable)  Increasing hypoxia on 6/5, ultimately requiring re-intubation with FiO2 100%. CXR demonstrated moderate pulmonary edema. Etiology of pulmonary edema most likely cardiogenic given concurrent suspected stress cardiomyopathy, though she is also at risk for ARDS and noncardiogenic pulmonary edema cannot be excluded. Diuresed with improving oxygen requirement, now on room air.   - CTM    Left pleural effusion (stable)  CXR 6/4 remarkable for moderate Left pleural effusion with passive atelectasis. S/p thoracentesis on 6/7. Pleural fluid c/w transudative effusion by protein parameters.   - CTM    Cardiovascular   Undifferentiated shock - resolved  Arrived to [facility] MICU in undifferentiated shock requiring 2 pressors. Acutely worsening hypotension on 6/4 requiring fourth pressor, aggressive fluid resuscitation, and massive transfusion protocol. S/p emergent TIPS on 6/4. Isolated fever on 6/6, prerenal AKI, and central venous O2 nearly 90 concerning for some component of septic shock, though infectious workup  remains unremarkable, and central venous O2 may be confounded in the setting of cirrhosis.  Has intermittently been on low-dose norepi since 6/14, however after switching to a SBP goal, she has been successfully weaned. Septic shock secondary to aspiration is possible for which we are covering for Zosyn , however reassuringly she remains on room air without dyspnea.  She continues to intermittently have low blood pressures while she is asleep, however suspect this is her baseline and she continues to [facility] appropriately without any evidence of poor perfusion.  Will increase midodrine .  - Increase midodrine  20mg  q8  - Titrate norepinephrine  for SBP > 90  - Abx: Zosyn  (6/14 - 6/20 )    Stress cardiomyopathy - Demand Ischemia   With elevated troponin on admission (5.4k) likely 2/2 demand ischemia in the setting of shock. Echo on admission overall normal with EF > 55%. Developed several episodes of wide complex tachycardia on 6/5. Bedside POCUS with apical ballooning and slightly decreased EF to 45-50%. EKG was without acute ischemic changes. Troponin peaked at 23k (6/6). Formal TTE demonstrated reduced EF to 45% with basal and midventricular hypokinesis. Findings favored to represent stress cardiomyopathy rather than ACS in the absence of acute EKG changes. Repeat TTE 6/10 with EF 40%, marginally decreased from prior.   -Repeat TTE in 3 months    Wide complex tachycardia - Hx ventricular tachycardia  Developed VT arrest requiring shock at OSH with initial TIPS attempt. On 6/5 developed several episodes of wide complex tachycardia. EKG with tachycardia to 130s, thought by cardiology to represent a fib with abberancy rather than VT. She was stabilized with amiodarone  bolus, followed by amiodarone  gtt and then PO amio. Finished amio load 6/12, no role for amio moving forward as this was precipitated iso critical illness.   - Daily CMP  - Replete K>4, Mg>2    CAD, prior NSTEMI  Prior NSTEMI per chart review. No LHC data available. Unclear if she has history of coronary stent.   - Home aspirin  held ISO bleeding    Renal   Prerenal AKI (resolved)  Cr elevated to 1.32 from baseline ~0.8. Urine lytes most consistent with prerenal etiology, 2/2 hypoperfusion iso shock, unlikely HRS. Cr normalized, adequate UOP.  - Daily CMP    Infectious Disease/Autoimmune   Aspiration pneumonia  Presented to [facility] MICU in 2-pressor shock with leukocytosis to 24.9, infectious source never identified.. With fever to 38.4 on 6/6. Paracentesis with PMNs < 250. Thoracentesis transudative. UA noninfectious. Lrcx gram stain with 1+ yeast, 1+ GPC. Fluctuating single pressor requirement since 6/9, repeat infectious workup negative. Had possible aspiration event overnight into 6/15 for which Zosyn  was resumed, she is clinically improved since starting antibiotics, anticipate full treatment course for aspiration pneumonia for 7 days..  - Bcx ngtd  - Fu repeat LRCx  - Zosyn  (6/14 - 6/20)    Cultures:  Blood Culture, Routine (no units)   Date Value   05/14/2024 No Growth at 5 days   05/14/2024 No Growth at 5 days     Urine Culture, Comprehensive (no units)   Date Value   05/14/2024 10,000 to 50,000 CFU/mL Candida glabrata (A)   05/04/2024 NO GROWTH     Lower Respiratory Culture (no units)   Date Value   05/07/2024 OROPHARYNGEAL FLORA ISOLATED   05/06/2024 Specimen Not Processed     WBC (10*9/L)   Date Value   05/20/2024 7.5     WBC, UA (/HPF)   Date Value   05/10/2024 25 (H)  FEN/GI   Decompensated cirrhosis c/b esophageal varices, gastric varix, hepatic encephalopathy, ascites   Presenting with hematemesis 6 days after esophageal banding. Emergent TIPS at OSH not successful d/t inability to access the portal vein. EGD on 6/4 demonstrated nonbleeding esophageal ulcers at site of prior banding, stomach completely occluded by blood which could not be cleared endoscopically. With acute decline in Hb and hypotension on 6/4. Lasandra attempted with GI, but unsuccessful after numerous attempts. S/p salvage TIPS on 6/4 with no further GI bleeding. Will slowly resume home diuretic to avoid worsening volume overload.  - Hepatology following  - Daily MELLD labs (CMP, PT/INR)  - Maintain fibrinogen > 100 with cryo  - Home spironolactone  held d/t hypotension   -Start p.o. Lasix  20 mg  - Continue rifaximin , continue lactulose  20g BID for goal 3-5 Bms daily    Acute on chronic liver failure - Ischemic hepatitis (improving)  LFTs continue to rise s/p variceal hemorrhage and TIPS. Unfortunately, precluded from transplant eval with newly reduced EF. LFTs slowly improving, suggesting ongoing recovery from TIPS and hemorrhagic event.  - Hepatology following    Malnutrition Assessment:   Tube feeds started 6/9.  Passed swallow study, on regular diet and eating well.  - Continue full diet       Heme/Coag   Blood loss anemia, likely 2/2 refractory portal hypertensive bleeding (resolved)  S/p 3 units pRBCs at OSH, Hb 9.3 on arrival to MICU. S/p massive transfusion protocol on 6/4. Low clinical concern for ongoing blood loss with stable Hb, last transfusion 6/4.   - Daily CBC    Mild Thrombocytopenia (stable)  Plts 253k on admission, acute decline to 50k on 6/5. Currently without signs of active bleeding. Plts remain stable around ~100-150k.  - Daily CBC    Endocrine   Insulin -dependent T2DM  A1c 5.6 on admission. Home regimen lantus  60, lispro with meals. BG control challenging in the setting of critical illness and stress dose steroids. S/p insulin  infusion, converted to NPH 6/8.  Continuing to titrate insulin  while off continuous tube feeds and p.o. only.  Will consolidate NPH to glargine 60 units nightly (home dose) and restart nutritional insulin  as well.  - Glargine 50 units nightly  - Continue lispro 8u TIDAC  - Lispro SSI QID    Integumentary   Psoriasis (stable)  History of psoriasis per chart review. With numerous chronic appearing scattered erythematous plaques with silvery scale. Of note, on Secukinumab  for psoriasis management, which can increase risk for infection.  -Continue clobetasol      # Irritant contact dermatitis (buttocks)  # Wound, R upper anterior leg  # Irritant contact dermatitis (anterior pelvis)  # Intertrigo along pannus  # Scalp wound 2/2 psoriasis  - WOCN consulted for high risk skin assessment Yes.  - Recommendations per WOCN note:  WOCN Recommendations:   - See nursing orders for wound care instructions.  - Contact WOCN with questions, concerns, or wound deterioration.  Topical Therapy/Interventions:   - Absorbent Dressing  - Antimicrobial Solutions  - Crusting (stoma powder or antifungal powder)  - Hydrofiber  - Zinc oxide barrier cream      Prophylaxis/LDA/Restraints/Consults   ICU checklist completed: yes (see ICU rounding navigator in Epic)    Patient Lines/Drains/Airways Status       Active Active Lines, Drains, & Airways       Name Placement date Placement time Site Days    Open Drain Inferior;Lateral;Right Back 05/14/24  1357  Back  5    Peripheral  IV 05/04/24 Anterior;Left;Upper Arm 05/04/24  0500  Arm  16    Peripheral IV 05/04/24 Anterior;Right;Upper Arm 05/04/24  0500  Arm  16                  Patient Lines/Drains/Airways Status       Active Wounds       Name Placement date Placement time Site Days    Surgical Site 05/04/24 Abdomen Lower;Right 05/04/24  2300  -- 15    Surgical Site 05/04/24 Neck Right 05/04/24  2300  -- 15    Surgical Site 05/04/24 Groin Right 05/04/24  2300  -- 15    Wound 05/12/24 Irritant Contact Dermatitis Rash/Dermatitis Buttocks 05/12/24  1220  Buttocks  7    Wound 05/12/24 Other (Comment) Leg Anterior;Right;Upper 05/12/24  1225  Leg  7    Wound 05/12/24 Irritant Contact Dermatitis Perspiration Pelvis Anterior;Right Intertrigo along pannus 05/12/24  1226  Pelvis  7    Wound 05/12/24 Other (Comment) Head Posterior;Upper 05/12/24  1227  Head  7                    Goals of Care     Code Status: Full Code    Designated Healthcare Decision Maker:  Ms. Davidoff current decisional capacity for healthcare decision-making is Full capacity. Her designated healthcare decision maker(s) is/are   HCDM (patient stated preference): Caudle,Ryan - Relative - [phone].      Subjective     Patient doing well this morning.  Mentation remains good at her baseline.  Eating and drinking well.  Denies any pain or shortness of breath.     Vitals - past 24 hours  Temp:  [35.1 ??C (95.2 ??F)-35.7 ??C (96.3 ??F)] 35.7 ??C (96.2 ??F)  Pulse:  [62-84] 65  SpO2 Pulse:  [59-84] 68  Resp: [12-25] 16  BP: (61-106)/(27-54) 92/31  SpO2:  [94 %-100 %] 94 % Intake/Output  I/O last 3 completed shifts:  In: 1301.7 [P.O.:850; I.V.:40; IV Piggyback:411.7]  Out: 791 [Urine:631; Drains:160]     Physical Exam:    General: Chronically ill-appearing female, lying in bed.   HEENT: Mucous membranes dry. Sclerae icteric.   CV: RRR  Pulm: CTAB on anterior exam.  GI: Normoactive bowel sounds. Soft, distended. Non-tender to palpation  Extremities: Trace edema BLE.   Skin: Diffuse scattered erythematous plaques with silvery scale. No bruising, petechiae, mucocutaneous bleeding.  Neuro: AOx4    Continuous Infusions:         Scheduled Medications:    clobetasol    Topical BID    enoxaparin  (LOVENOX ) injection  40 mg Subcutaneous Q24H    ergocalciferol-1,250 mcg (50,000 unit)  1,250 mcg Oral Weekly    [Provider Hold] furosemide   20 mg Oral Daily    insulin  glargine  50 Units Subcutaneous Nightly    insulin  lispro  8 Units Subcutaneous 3xd Meals    insulin  lispro  0-20 Units Subcutaneous ACHS    lactulose   20 g Oral BID    levETIRAcetam   750 mg Oral BID    midodrine   20 mg Oral TID    multivitamins (ADULT)  1 tablet Oral Daily    pantoprazole   40 mg Oral Daily    piperacillin -tazobactam  4.5 g Intravenous Q8H    sodium chloride   10 mL Intravenous Q8H    sodium chloride   10 mL Intravenous Q8H    sodium chloride   10 mL Intravenous Q8H    ursodiol   300 mg Oral BID  zinc sulfate  220 mg Oral Daily       PRN medications:  acetaminophen , dextrose  in water , glucagon, glucose, hydrOXYzine , melatonin, ondansetron     Data/Imaging Review: Reviewed in Epic and personally interpreted on 05/20/2024. See EMR for detailed results.    Ashley JAYSON Diamond, MD

## 2024-05-20 NOTE — Unmapped (Signed)
 WOCN Consult Services                                                                 Wound Evaluation     Reason for Consult:   - Follow-up  - Incontinence Associated Dermatitis  - Moisture Associated Skin Damage    Problem List:   Principal Problem:    Shock     Active Problems:    Type 2 diabetes mellitus, with long-term current use of insulin        Hyperlipidemia    Hypertension    Coronary artery disease involving native heart without angina pectoris    Diabetes mellitus       Esophageal varices in cirrhosis       Hepatic encephalopathy       Calcified cerebral meningioma       Portal hypertension       Other chronic pain    OSA (obstructive sleep apnea)    Stage 3a chronic kidney disease (CMS-HCC)    Hematemesis    Assessment: Per EMR Ashley Briggs is a 58 y.o. female with PMH CAD s/p NSTEMI, CKD, NAS cirrhosis c/b ascites, hepatic encephalopathy and EV s/p recent banding admitted with UGI bleed 2/2 bleeding esophageal ulcers s/p unsusccessful TIPS c/b pulsatile VT s/p shock requiring transfer to [facility] ICU. Course complicated by severe hemorrhagic shock 2/2 gastric bleed now s/p TIPS with course complicated by acute hypoxic respiratory failure, AKI, stress cardiomyopathy and hepatic encephalopathy. Now with slight increase in pressor requirement. Weaning sedation to plan for extubation in the near future.     CWOCN follow up for weeping wounds and MASD/Intertrigo. Pt seen in MICU, alert with NA at bedside to assist with rolling. Upon assessment, pt buttocks appears stable from previous assessment with similar margins. Recommend to continue to crust open areas with Antifungal Powder and No sting barrier film then protect with a thick layer or Critic Aid (use zinc paste if unavailable). Pt continues to have severe intertrigo to her right lower pannus and groin folds, recommend to crust open areas and protect with Mepilex AG (please do not apply aqaucel AG along folds). Right paracentesis site and RLE blisters remain weeping large amounts of serous drainage.  Recommend to apply aquacel ag and secure with absorbant dressing. Dermatology ordered Secukinumab  and clobetasol  for psoriasis management. Bilateral heels intact. Continue to follow Skin Integrity Protocol and reposition/offload pt frequently.     Wound 05/12/24 Irritant Contact Dermatitis Rash/Dermatitis Buttocks (Active)   Properties   Placement Date 05/12/24   Placement Time 1220   Location Buttocks   Primary Wound Type Irritant Con   Secondary Wound Type - Irritant Contact Dermatitis Rash/Dermati      Assessments 05/20/2024  1:00 PM   Wound Image     Dressing Status      No dressing   Peri-wound Assessment      Blanchable erythema   Odor None   Site Assessment Pink;Red;Sloughing   Slough % 76-100%   Treatments Zinc based products;Cleansed/Irrigation   Dressing Moisture barrier cream       Wound 05/12/24 Other (Comment) Leg Anterior;Right;Upper (Active)   Properties   Placement Date 05/12/24   Placement Time 1225   Location Leg   Primary Wound Type Other (blister)  Wound Location Orientation Anterior;Right;Upper      Assessments 05/20/2024  1:00 PM   Wound Image     Dressing Status      Changed   Peri-wound Assessment      Edema   Odor None   Site Assessment Pink;Red   Treatments Cleansed/Irrigation   Dressing Hydrofiber with silver;Silver foam       Wound 05/12/24 Irritant Contact Dermatitis Perspiration Pelvis Anterior;Right Intertrigo along pannus (Active)   Properties   Placement Date 05/12/24   Placement Time 1226   Location Pelvis   Primary Wound Type Irritant Con   Secondary Wound Type - Irritant Contact Dermatitis Perspiration   Wound Location Orientation Anterior;Right   Wound Description (Comments) Intertrigo along pannus      Assessments 05/20/2024  1:00 PM   Dressing Status      Changed   Peri-wound Assessment      Denuded   Odor None   Site Assessment Red;Pink   Treatments Cleansed/Irrigation;No sting barrier film   Dressing Silver foam (crusting)   Dressing Changed Changed         Continence Status:   Incontinence of bladder: Foley in place  Incontinent of bowel: incontinence pads    Moisture Associated Skin Damage:   - Incontinence-associated dermatitis (IAD)  - Intertrigo     Lab Results   Component Value Date    WBC 7.5 05/20/2024    HGB 10.3 (L) 05/20/2024    HCT 30.1 (L) 05/20/2024    ESR >130 (H) 06/02/2023    CRP 20.0 (H) 06/02/2023    A1C 5.6 05/04/2024    GLU 175 05/20/2024    POCGLU 134 05/20/2024    ALBUMIN  2.2 (L) 05/20/2024    PROT  05/20/2024      Comment:      Specimen Icteric; Unable to calculate.     Support Surface:   - Low Air Loss - ICU    Offloading:  Left: Heel Offloading Boot and Pillow  Right: Heel Offloading Boot and Pillow    Type Debridement Completed By WOCN:  N/A    Teaching:  - Moisture management  - Offloading    WOCN Recommendations:   - See nursing orders for wound care instructions.  - Contact WOCN with questions, concerns, or wound deterioration.    Topical Therapy/Interventions:   - Absorbent Dressing  - Antimicrobial Solutions  - Crusting (stoma powder or antifungal powder)  - Hydrofiber  - Zinc oxide barrier cream     Recommended Consults:  - Dermatology    WOCN Follow Up:  - Weekly    Plan of Care Discussed With:   - Patient  - RN primary    Supplies Ordered: No     Workup Time:   45 minutes    Reche Malady BSN, RN, [employer]  Wound Ostomy Consult Service   Vocera or [employer] when available

## 2024-05-20 NOTE — Unmapped (Signed)
 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 The patient is seen in consultation at the request of Clotilda Jackquline Rubin, MD (Medical ICU ([facility])) for decompensated cirrhosis with variceal bleeding.    Patient with significant UGIB likely 2/2 gastric varix that was unable to be controlled endoscopically. Patient underwent successful salvage TIPS 6/4 and has stopped bleeding. Unfortunately, she was re-intubated (likely iso worsening HE), now extubated and found to have stress CM. Suspect she will need several days to settle out, focus on mobilization and nutrition. With hypothermia despite empiric zosyn  and decline in renal function concerning for underlying infection though extensive evaluation has been unrevealing. Urine studies suggestive of pre-renal etiology.     Recommendations:  - Daily MELD labs  - give albumin  75g x 1 today  - monitor UOP  - Cont PO PPI BID (home med)  - Continue lactulose  for 3-5 bowel movements daily and rifaximin        Issues Impacting Complexity of Management:  -The patient has the need for intensive monitoring parameter(s) due to high-risk of clinical decline: q4h or more frequent monitoring of hemoglobins to monitor for stability of GI bleeding and frequent monitoring of MELD score to monitor for progression to/progression of acute or acute on chronic liver failure    Recommendations discussed with the patient's primary team. We will continue to follow along with you.    Subjective:   - hypothermic around 4 pm, started on bair hugger  - renal function acutely worsened to Cr 1.36, from 0.62  - Urine Na <10  - UOP declining, 410 ml today     Patient with no new complaints today. Family member at bedside reports her appetite has been poor.     Objective:   Temp:  [34.8 ??C (94.6 ??F)-36.2 ??C (97.1 ??F)] 36.2 ??C (97.1 ??F)  Pulse:  [62-81] 79  SpO2 Pulse:  [59-81] 79  Resp:  [12-18] 17  BP: (76-124)/(26-53) 90/26  SpO2:  [94 %-98 %] 95 %    Gen: Ill appearing  HEENT: intubated   Abdomen:  soft, moderately distended     Pertinent Labs & Studies:  -I have reviewed the patient's labs from 05/20/24 which show stable Hgb, worsening LFTs, and stable INR  MELD 3.0: 30 at 05/20/2024  4:54 PM  MELD-Na: 29 at 05/20/2024  4:54 PM  Calculated from:  Serum Creatinine: 1.34 mg/dL at 3/79/7974  5:45 PM  Serum Sodium: 128 mmol/L at 05/20/2024  4:54 PM  Total Bilirubin: 21.8 mg/dL at 3/79/7974  7:84 AM  Serum Albumin : 2.2 g/dL at 3/79/7974  7:84 AM  INR(ratio): 1.43 at 05/20/2024  2:15 AM  Age at listing (hypothetical): 57 years  Sex: Female at 05/20/2024  4:54 PM

## 2024-05-20 NOTE — Unmapped (Signed)
 MICU Daily Progress Note     Date of Service: 05/20/2024    Problem List:   Principal Problem:    Shock     Active Problems:    Type 2 diabetes mellitus, with long-term current use of insulin        Hyperlipidemia    Hypertension    Coronary artery disease involving native heart without angina pectoris    Diabetes mellitus       Esophageal varices in cirrhosis       Hepatic encephalopathy       Calcified cerebral meningioma       Portal hypertension       Other chronic pain    OSA (obstructive sleep apnea)    Stage 3a chronic kidney disease (CMS-HCC)    Hematemesis      Interval history: Ashley Briggs is a 58 y.o. female with PMH CAD s/p NSTEMI, CKD, NAS cirrhosis c/b ascites, hepatic encephalopathy and EV s/p recent banding admitted with UGI bleed 2/2 bleeding esophageal ulcers s/p unsusccessful TIPS c/b pulsatile VT s/p shock requiring transfer to [facility] ICU. Course complicated by severe hemorrhagic shock 2/2 gastric bleed now s/p TIPS with course complicated by acute hypoxic respiratory failure, AKI, stress cardiomyopathy and hepatic encephalopathy. Hgb stable. Extubated 6/12 and weaned to RA. Remains in the MICU for pressor requirement.    24hr events:   - Noted rising bilirubin, Hepatology asked for 75g albumin   - Gave 75g albumin   - AM labs with rising Cr, if rising on PM labs plan to start pressors for MAP > 65  - Made ICU status again    Neurological   Sedation (resolved)  Sedation held and extubated 6/12. Now A&O.     History of seizure-like activity (stable)  - Home Keppra  750mg  BID    Chronic pain  History of chronic pain, treated with baclofen , naltrexone , oxycodone .   - Hold home baclofen , naltrextone, oxycodone  in the setting of hypotension and sedation  - Will slowly reintroduce some home medications due to bothersome neuropathic pain    Major depressive disorder (stable)  - Hold home bupoprion, citalopram     Insomnia (stable)  - Hold home zolpidem     PMH calcified cerebral meningioma (stable)  - no intervention at this time    Analgesia: pain adequately controlled    Pulmonary   S/p Intubation and mechanical ventilation (resolved)  Previously intubated for procedures and altered mental status. Extubated 6/5, re-intubated later the same day for worsening hypoxia. Successfully extubated 6/12 and now weaned to RA-2L.  Was briefly on 2 L for what she describes as a panic attack.  -Wean O2 as able    Pulmonary edema (stable)  Increasing hypoxia on 6/5, ultimately requiring re-intubation with FiO2 100%. CXR demonstrated moderate pulmonary edema. Etiology of pulmonary edema most likely cardiogenic given concurrent suspected stress cardiomyopathy, though she is also at risk for ARDS and noncardiogenic pulmonary edema cannot be excluded. Diuresed with improving oxygen requirement, now on room air.   - CTM    Left pleural effusion (stable)  CXR 6/4 remarkable for moderate Left pleural effusion with passive atelectasis. S/p thoracentesis on 6/7. Pleural fluid c/w transudative effusion by protein parameters.   - CTM    Cardiovascular   Undifferentiated shock  Arrived to [facility] MICU in undifferentiated shock requiring 2 pressors. Acutely worsening hypotension on 6/4 requiring fourth pressor, aggressive fluid resuscitation, and massive transfusion protocol. S/p emergent TIPS on 6/4. Isolated fever on 6/6, prerenal AKI, and central venous O2  nearly 90 concerning for some component of septic shock, though infectious workup remains unremarkable, and central venous O2 may be confounded in the setting of cirrhosis.  Has intermittently been on low-dose norepi since 6/14, however after switching to a SBP goal, she has been successfully weaned. Septic shock secondary to aspiration is possible for which we are covering for Zosyn , however reassuringly she remains on room air without dyspnea.  She has low BP at baseline, continues to intermittently have low blood pressures while she is asleep. Has been mentating well and without other acute complaint, but noted to have rising Cr and bilirubin on 6/20, raising concern for shock again. Will continue to monitor, consider re-initiating pressors to allow for better renal perfusion.  - Continue midodrine  20mg  q8  - Abx: Zosyn  (6/14 - 6/20)    Stress cardiomyopathy - Demand Ischemia   With elevated troponin on admission (5.4k) likely 2/2 demand ischemia in the setting of shock. Echo on admission overall normal with EF > 55%. Developed several episodes of wide complex tachycardia on 6/5. Bedside POCUS with apical ballooning and slightly decreased EF to 45-50%. EKG was without acute ischemic changes. Troponin peaked at 23k (6/6). Formal TTE demonstrated reduced EF to 45% with basal and midventricular hypokinesis. Findings favored to represent stress cardiomyopathy rather than ACS in the absence of acute EKG changes. Repeat TTE 6/10 with EF 40%, marginally decreased from prior.   -Repeat TTE in 3 months    Wide complex tachycardia - Hx ventricular tachycardia  Developed VT arrest requiring shock at OSH with initial TIPS attempt. On 6/5 developed several episodes of wide complex tachycardia. EKG with tachycardia to 130s, thought by cardiology to represent a fib with abberancy rather than VT. She was stabilized with amiodarone  bolus, followed by amiodarone  gtt and then PO amio. Finished amio load 6/12, no role for amio moving forward as this was precipitated iso critical illness.   - Daily CMP  - Replete K>4, Mg>2    CAD, prior NSTEMI  Prior NSTEMI per chart review. No LHC data available. Unclear if she has history of coronary stent.   - Home aspirin  held ISO bleeding    Renal   AKI   Had course of Cr elevation to 1.6 from baseline ~0.8 around 6/7. Urine lytes most consistent with prerenal etiology at that time, 2/2 hypoperfusion iso shock, unlikely HRS. Cr normalized, adequate UOP, but worsened again 6/20 with concomitant rise in bilirubin. Concern is for pre-renal injury again given our lax BP goal of SBP >85, although patient is mentating well and does not have other clinical changes to support that she is in shock again.  - Monitor Cr    Infectious Disease/Autoimmune   Aspiration pneumonia  Presented to [facility] MICU in 2-pressor shock with leukocytosis to 24.9, infectious source never identified.. With fever to 38.4 on 6/6. Paracentesis with PMNs < 250. Thoracentesis transudative. UA noninfectious. Lrcx gram stain with 1+ yeast, 1+ GPC. Fluctuating single pressor requirement since 6/9, repeat infectious workup negative. Had possible aspiration event overnight into 6/15 for which Zosyn  was resumed, she is clinically improved since starting antibiotics, anticipate full treatment course for aspiration pneumonia for 7 days.  - Bcx ngtd  - Unable to produce LRCx  - Zosyn  (6/14 - 6/20)    Cultures:  Blood Culture, Routine (no units)   Date Value   05/14/2024 No Growth at 5 days   05/14/2024 No Growth at 5 days     Urine Culture, Comprehensive (no units)  Date Value   05/14/2024 10,000 to 50,000 CFU/mL Candida glabrata (A)   05/04/2024 NO GROWTH     Lower Respiratory Culture (no units)   Date Value   05/07/2024 OROPHARYNGEAL FLORA ISOLATED   05/06/2024 Specimen Not Processed     WBC (10*9/L)   Date Value   05/20/2024 7.5     WBC, UA (/HPF)   Date Value   05/10/2024 25 (H)          FEN/GI   Decompensated cirrhosis c/b esophageal varices, gastric varix, hepatic encephalopathy, ascites   Presenting with hematemesis 6 days after esophageal banding. Emergent TIPS at OSH not successful d/t inability to access the portal vein. EGD on 6/4 demonstrated nonbleeding esophageal ulcers at site of prior banding, stomach completely occluded by blood which could not be cleared endoscopically. With acute decline in Hb and hypotension on 6/4. Lasandra attempted with GI, but unsuccessful after numerous attempts. S/p salvage TIPS on 6/4 with no further GI bleeding. Will slowly resume home diuretic to avoid worsening volume overload.  - Hepatology following  - Daily MELLD labs (CMP, PT/INR)  - Maintain fibrinogen > 100 with cryo  - Home spironolactone  held d/t hypotension   -Start p.o. Lasix  20 mg  - Continue rifaximin , continue lactulose  20g BID for goal 3-5 Bms daily    Acute on chronic liver failure - Ischemic hepatitis  LFTs rose s/p variceal hemorrhage and TIPS. Unfortunately, precluded from transplant eval with newly reduced EF. LFTs slowly improved, suggesting ongoing recovery from TIPS and hemorrhagic event. Bilirubin slowly becoming elevated again, first noted 6/20.  - Hepatology following    Malnutrition Assessment:   Tube feeds started 6/9.  Passed swallow study, on regular diet and eating well.  - Continue full diet       Heme/Coag   Blood loss anemia, likely 2/2 refractory portal hypertensive bleeding (resolved)  S/p 3 units pRBCs at OSH, Hb 9.3 on arrival to MICU. S/p massive transfusion protocol on 6/4. Low clinical concern for ongoing blood loss with stable Hb, last transfusion 6/4.   - Daily CBC    Mild Thrombocytopenia (stable)  Plts 253k on admission, acute decline to 50k on 6/5. Currently without signs of active bleeding. Plts remain stable around ~100-150k.  - Daily CBC    Endocrine   Insulin -dependent T2DM  A1c 5.6 on admission. Home regimen lantus  60, lispro with meals. BG control challenging in the setting of critical illness and stress dose steroids. S/p insulin  infusion, converted to NPH 6/8.  Continuing to titrate insulin  while off continuous tube feeds and p.o. only.  Will consolidate NPH to glargine 60 units nightly (home dose) and restart nutritional insulin  as well.  - Glargine 50 units nightly  - Continue lispro 8u TIDAC  - Lispro SSI QID    Integumentary   Psoriasis (stable)  History of psoriasis per chart review. With numerous chronic appearing scattered erythematous plaques with silvery scale. Of note, on Secukinumab  for psoriasis management, which can increase risk for infection.  -Continue clobetasol      # Irritant contact dermatitis (buttocks)  # Wound, R upper anterior leg  # Irritant contact dermatitis (anterior pelvis)  # Intertrigo along pannus  # Scalp wound 2/2 psoriasis  - WOCN consulted for high risk skin assessment Yes.  - Recommendations per WOCN note:  WOCN Recommendations:   - See nursing orders for wound care instructions.  - Contact WOCN with questions, concerns, or wound deterioration.  Topical Therapy/Interventions:   - Absorbent Dressing  -  Antimicrobial Solutions  - Crusting (stoma powder or antifungal powder)  - Hydrofiber  - Zinc oxide barrier cream      Prophylaxis/LDA/Restraints/Consults   ICU checklist completed: yes (see ICU rounding navigator in Epic)    Patient Lines/Drains/Airways Status       Active Active Lines, Drains, & Airways       Name Placement date Placement time Site Days    Open Drain Inferior;Lateral;Right Back 05/14/24  1357  Back  5    Peripheral IV 05/04/24 Anterior;Left;Upper Arm 05/04/24  0500  Arm  16    Peripheral IV 05/04/24 Anterior;Right;Upper Arm 05/04/24  0500  Arm  16                  Patient Lines/Drains/Airways Status       Active Wounds       Name Placement date Placement time Site Days    Surgical Site 05/04/24 Abdomen Lower;Right 05/04/24  2300  -- 15    Surgical Site 05/04/24 Neck Right 05/04/24  2300  -- 15    Surgical Site 05/04/24 Groin Right 05/04/24  2300  -- 15    Wound 05/12/24 Irritant Contact Dermatitis Rash/Dermatitis Buttocks 05/12/24  1220  Buttocks  7    Wound 05/12/24 Other (Comment) Leg Anterior;Right;Upper 05/12/24  1225  Leg  7    Wound 05/12/24 Irritant Contact Dermatitis Perspiration Pelvis Anterior;Right Intertrigo along pannus 05/12/24  1226  Pelvis  7    Wound 05/12/24 Other (Comment) Head Posterior;Upper 05/12/24  1227  Head  7                    Goals of Care     Code Status: Full Code    Designated Healthcare Decision Maker:  Ms. Peddie current decisional capacity for healthcare decision-making is Full capacity. Her designated healthcare decision maker(s) is/are   HCDM (patient stated preference): Caudle,Ryan - Relative - [phone].      Subjective     Doing well this AM. No acute concerns. No belly pain. Feels she is making good UOP.    __________________________________________________________________     [place] - past 24 hours  Temp:  [35.1 ??C (95.2 ??F)-35.7 ??C (96.3 ??F)] 35.7 ??C (96.2 ??F)  Pulse:  [62-84] 65  SpO2 Pulse:  [59-84] 68  Resp:  [12-25] 16  BP: (61-106)/(27-54) 92/31  SpO2:  [94 %-100 %] 94 % Intake/Output  I/O last 3 completed shifts:  In: 1301.7 [P.O.:850; I.V.:40; IV Piggyback:411.7]  Out: 791 [Urine:631; Drains:160]     Physical Exam:    General: Chronically ill-appearing female, lying in bed.   HEENT: Mucous membranes dry. Sclerae icteric.   CV: RRR  Pulm: CTAB on anterior exam.  GI: Normoactive bowel sounds. Soft, distended. Non-tender to palpation  Extremities: Trace edema BLE.   Skin: Diffuse scattered erythematous plaques with silvery scale. No bruising, petechiae, mucocutaneous bleeding.  Neuro: AOx4    Continuous Infusions:         Scheduled Medications:    albumin  human 25 %  50 g Intravenous Q12H [facility]    clobetasol    Topical BID    enoxaparin  (LOVENOX ) injection  40 mg Subcutaneous Q24H    [START ON 05/27/2024] ergocalciferol-1,250 mcg (50,000 unit)  1,250 mcg Oral Weekly    [Provider Hold] furosemide   20 mg Oral Daily    insulin  glargine  60 Units Subcutaneous Nightly    insulin  lispro  10 Units Subcutaneous 3xd Meals    insulin  lispro  0-20 Units  Subcutaneous ACHS    lactulose   20 g Oral BID    levETIRAcetam   750 mg Oral BID    midodrine   20 mg Oral TID    multivitamins (ADULT)  1 tablet Oral Daily    pantoprazole   40 mg Oral Daily    piperacillin -tazobactam  4.5 g Intravenous Q8H    potassium chloride   40 mEq Oral Once    sodium chloride   10 mL Intravenous Q8H    sodium chloride   10 mL Intravenous Q8H    sodium chloride   10 mL Intravenous Q8H    ursodiol   300 mg Oral BID    [START ON 05/21/2024] zinc sulfate  220 mg Oral Daily       PRN medications:  acetaminophen , dextrose  in water , glucagon, glucose, hydrOXYzine , melatonin, ondansetron     Data/Imaging Review: Reviewed in Epic and personally interpreted on 05/20/2024. See EMR for detailed results.    Victory JAYSON Diamond, MD

## 2024-05-20 NOTE — Unmapped (Signed)
 Patient Icu status in the MICU. Urine output closely monitored. albumin  given once. Pt found to be hypothermic in afternoon, bare hugger applied to rewarm.  Problem: Adult Inpatient Plan of Care  Goal: Plan of Care Review  Outcome: Progressing  Goal: Patient-Specific Goal (Individualized)  Outcome: Progressing  Goal: Absence of Hospital-Acquired Illness or Injury  Outcome: Progressing  Intervention: Prevent Skin Injury  Recent Flowsheet Documentation  Taken 05/20/2024 1600 by Oralee Canterbury, RN  Positioning for Skin: Left  Taken 05/20/2024 1400 by Oralee Canterbury, RN  Positioning for Skin: Right  Taken 05/20/2024 1200 by Oralee Canterbury, RN  Positioning for Skin: Left  Taken 05/20/2024 1000 by Oralee Canterbury, RN  Positioning for Skin: Right  Taken 05/20/2024 0800 by Oralee Canterbury, RN  Positioning for Skin: Left  Goal: Optimal Comfort and Wellbeing  Outcome: Progressing  Goal: Readiness for Transition of Care  Outcome: Progressing  Goal: Rounds/Family Conference  Outcome: Progressing

## 2024-05-20 NOTE — Unmapped (Signed)
 Pt bp has improved a bit towards the latter part of the shift. Pt had mult bms. Urine output minimal. Weeping constantly from abdomen. Remains step down status.      Problem: Adult Inpatient Plan of Care  Goal: Plan of Care Review  Outcome: Progressing  Goal: Patient-Specific Goal (Individualized)  Outcome: Progressing  Goal: Absence of Hospital-Acquired Illness or Injury  Outcome: Progressing  Intervention: Identify and Manage Fall Risk  Recent Flowsheet Documentation  Taken 05/19/2024 2000 by Alphia Shelba RAMAN, RN  Safety Interventions:   aspiration precautions   low bed   fall reduction program maintained   bleeding precautions   bed alarm   infection management   commode/urinal/bedpan at bedside  Intervention: Prevent Skin Injury  Recent Flowsheet Documentation  Taken 05/20/2024 0600 by Alphia Shelba RAMAN, RN  Positioning for Skin: Right  Taken 05/20/2024 0400 by Alphia Shelba RAMAN, RN  Positioning for Skin: Left  Taken 05/20/2024 0200 by Alphia Shelba RAMAN, RN  Positioning for Skin: Left  Taken 05/20/2024 0000 by Alphia Shelba RAMAN, RN  Positioning for Skin: Right  Taken 05/19/2024 2200 by Alphia Shelba RAMAN, RN  Positioning for Skin: Right  Taken 05/19/2024 2000 by Alphia Shelba RAMAN, RN  Positioning for Skin: Left  Device Skin Pressure Protection: absorbent pad utilized/changed  Skin Protection: adhesive use limited  Intervention: Prevent Infection  Recent Flowsheet Documentation  Taken 05/19/2024 2000 by Alphia Shelba RAMAN, RN  Infection Prevention:   cohorting utilized   personal protective equipment utilized   rest/sleep promoted   hand hygiene promoted  Goal: Optimal Comfort and Wellbeing  Outcome: Progressing  Goal: Readiness for Transition of Care  Outcome: Progressing  Goal: Rounds/Family Conference  Outcome: Progressing

## 2024-05-21 LAB — CBC W/ AUTO DIFF
BASOPHILS ABSOLUTE COUNT: 0 10*9/L (ref 0.0–0.1)
BASOPHILS ABSOLUTE COUNT: 0.4 10*9/L — ABNORMAL HIGH (ref 0.0–0.1)
BASOPHILS RELATIVE PERCENT: 0.4 %
BASOPHILS RELATIVE PERCENT: 1.6 %
EOSINOPHILS ABSOLUTE COUNT: 0 10*9/L (ref 0.0–0.5)
EOSINOPHILS ABSOLUTE COUNT: 0 10*9/L (ref 0.0–0.5)
EOSINOPHILS RELATIVE PERCENT: 0.6 %
EOSINOPHILS RELATIVE PERCENT: 0 %
HEMATOCRIT: 23.4 % — ABNORMAL LOW (ref 34.0–44.0)
HEMATOCRIT: 26.5 % — ABNORMAL LOW (ref 34.0–44.0)
HEMOGLOBIN: 8 g/dL — ABNORMAL LOW (ref 11.3–14.9)
HEMOGLOBIN: 8.7 g/dL — ABNORMAL LOW (ref 11.3–14.9)
LYMPHOCYTES ABSOLUTE COUNT: 0.3 10*9/L — ABNORMAL LOW (ref 1.1–3.6)
LYMPHOCYTES ABSOLUTE COUNT: 0.4 10*9/L — ABNORMAL LOW (ref 1.1–3.6)
LYMPHOCYTES RELATIVE PERCENT: 4.3 %
LYMPHOCYTES RELATIVE PERCENT: 1.4 %
MEAN CORPUSCULAR HEMOGLOBIN CONC: 34.2 g/dL (ref 32.0–36.0)
MEAN CORPUSCULAR HEMOGLOBIN CONC: 32.6 g/dL (ref 32.0–36.0)
MEAN CORPUSCULAR HEMOGLOBIN: 30.3 pg (ref 25.9–32.4)
MEAN CORPUSCULAR HEMOGLOBIN: 29.5 pg (ref 25.9–32.4)
MEAN CORPUSCULAR VOLUME: 88.7 fL (ref 77.6–95.7)
MEAN CORPUSCULAR VOLUME: 90.5 fL (ref 77.6–95.7)
MEAN PLATELET VOLUME: 8.9 fL (ref 6.8–10.7)
MEAN PLATELET VOLUME: 8.5 fL (ref 6.8–10.7)
MONOCYTES ABSOLUTE COUNT: 0.6 10*9/L (ref 0.3–0.8)
MONOCYTES ABSOLUTE COUNT: 1.2 10*9/L — ABNORMAL HIGH (ref 0.3–0.8)
MONOCYTES RELATIVE PERCENT: 8.3 %
MONOCYTES RELATIVE PERCENT: 4.5 %
NEUTROPHILS ABSOLUTE COUNT: 5.7 10*9/L (ref 1.8–7.8)
NEUTROPHILS ABSOLUTE COUNT: 24.8 10*9/L — ABNORMAL HIGH (ref 1.8–7.8)
NEUTROPHILS RELATIVE PERCENT: 86.4 %
NEUTROPHILS RELATIVE PERCENT: 92.5 %
PLATELET COUNT: 102 10*9/L — ABNORMAL LOW (ref 150–450)
PLATELET COUNT: 211 10*9/L (ref 150–450)
RED BLOOD CELL COUNT: 2.64 10*12/L — ABNORMAL LOW (ref 3.95–5.13)
RED BLOOD CELL COUNT: 2.93 10*12/L — ABNORMAL LOW (ref 3.95–5.13)
RED CELL DISTRIBUTION WIDTH: 21.3 % — ABNORMAL HIGH (ref 12.2–15.2)
RED CELL DISTRIBUTION WIDTH: 21.5 % — ABNORMAL HIGH (ref 12.2–15.2)
WBC ADJUSTED: 6.6 10*9/L (ref 3.6–11.2)
WBC ADJUSTED: 26.9 10*9/L — ABNORMAL HIGH (ref 3.6–11.2)

## 2024-05-21 LAB — COMPREHENSIVE METABOLIC PANEL
ALBUMIN: 3.1 g/dL — ABNORMAL LOW (ref 3.4–5.0)
ALKALINE PHOSPHATASE: 90 U/L (ref 46–116)
ALT (SGPT): 27 U/L (ref 10–49)
ANION GAP: 12 mmol/L (ref 5–14)
AST (SGOT): 40 U/L — ABNORMAL HIGH (ref <=34)
BILIRUBIN TOTAL: 20.9 mg/dL — ABNORMAL HIGH (ref 0.3–1.2)
CALCIUM: 9.1 mg/dL (ref 8.7–10.4)
CHLORIDE: 98 mmol/L (ref 98–107)
CO2: 18 mmol/L — ABNORMAL LOW (ref 20.0–31.0)
CREATININE: 1.38 mg/dL — ABNORMAL HIGH (ref 0.55–1.02)
EGFR CKD-EPI (2021) FEMALE: 44 mL/min/{1.73_m2} — ABNORMAL LOW (ref >=60)
GLUCOSE RANDOM: 176 mg/dL — ABNORMAL HIGH (ref 70–99)
POTASSIUM: 3.6 mmol/L (ref 3.4–4.8)
SODIUM: 128 mmol/L — ABNORMAL LOW (ref 135–145)

## 2024-05-21 LAB — PATIENT NEEDLESTICK PACKAGE
HEPATITIS B SURFACE ANTIGEN: NONREACTIVE
HEPATITIS C ANTIBODY: NONREACTIVE
HIV ANTIGEN/ANTIBODY COMBO: NONREACTIVE

## 2024-05-21 LAB — GLUCOSE, BODY FLUID: GLUCOSE FLUID: 203 mg/dL

## 2024-05-21 LAB — BLOOD GAS CRITICAL CARE PANEL, ARTERIAL
BASE EXCESS ARTERIAL: -16 — ABNORMAL LOW (ref -2.0–2.0)
CALCIUM IONIZED ARTERIAL (MG/DL): 3.98 mg/dL — ABNORMAL LOW (ref 4.40–5.40)
FIO2 ARTERIAL: 65
GLUCOSE WHOLE BLOOD: 166 mg/dL (ref 70–179)
HCO3 ARTERIAL: 12 mmol/L — ABNORMAL LOW (ref 22–27)
HEMOGLOBIN BLOOD GAS: 7.8 g/dL — ABNORMAL LOW (ref 12.00–16.00)
LACTATE BLOOD ARTERIAL: 4.7 mmol/L (ref <1.3)
O2 SATURATION ARTERIAL: 99.2 % (ref 94.0–100.0)
PCO2 ARTERIAL: 33.3 mmHg — ABNORMAL LOW (ref 35.0–45.0)
PH ARTERIAL: 7.15 — CL (ref 7.35–7.45)
PO2 ARTERIAL: 146 mmHg — ABNORMAL HIGH (ref 80.0–110.0)
POTASSIUM WHOLE BLOOD: 3.3 mmol/L — ABNORMAL LOW (ref 3.4–4.6)
SODIUM WHOLE BLOOD: 133 mmol/L — ABNORMAL LOW (ref 135–145)

## 2024-05-21 LAB — SLIDE REVIEW

## 2024-05-21 LAB — FIBRINOGEN: FIBRINOGEN LEVEL: 137 mg/dL — ABNORMAL LOW (ref 175–500)

## 2024-05-21 LAB — BLOOD GAS, VENOUS
BASE EXCESS VENOUS: -6.1 — ABNORMAL LOW (ref -2.0–2.0)
FIO2 VENOUS: 45
HCO3 VENOUS: 19 mmol/L — ABNORMAL LOW (ref 22–27)
O2 SATURATION VENOUS: 77.1 % (ref 40.0–85.0)
PCO2 VENOUS: 35 mmHg — ABNORMAL LOW (ref 40–60)
PH VENOUS: 7.34 (ref 7.32–7.43)
PO2 VENOUS: 48 mmHg (ref 30–55)

## 2024-05-21 LAB — PHOSPHORUS
PHOSPHORUS: 4.4 mg/dL (ref 2.4–5.1)
PHOSPHORUS: 5.2 mg/dL — ABNORMAL HIGH (ref 2.4–5.1)

## 2024-05-21 LAB — PROTIME-INR
INR: 1.85
PROTIME: 21.1 s — ABNORMAL HIGH (ref 9.9–12.6)

## 2024-05-21 LAB — MAGNESIUM
MAGNESIUM: 2.2 mg/dL (ref 1.6–2.6)
MAGNESIUM: 1.8 mg/dL (ref 1.6–2.6)

## 2024-05-21 LAB — LACTATE DEHYDROGENASE: LACTATE DEHYDROGENASE: 156 U/L (ref 120–246)

## 2024-05-21 MED ADMIN — potassium chloride 10 mEq in 100 mL IVPB: 10 meq | INTRAVENOUS | @ 14:00:00 | Stop: 2024-05-21

## 2024-05-21 MED ADMIN — piperacillin-tazobactam (ZOSYN) IVPB (premix) 4.5 g: 4.5 g | INTRAVENOUS | @ 09:00:00 | Stop: 2024-05-21

## 2024-05-21 MED ADMIN — Propofol (DIPRIVAN) injection: INTRAVENOUS | @ 10:00:00

## 2024-05-21 MED ADMIN — albumin human 25 % 75 g: 75 g | INTRAVENOUS | @ 01:00:00 | Stop: 2024-05-22

## 2024-05-21 MED ADMIN — phenylephrine 1 mg/10 mL (100 mcg/mL) injection Syrg: INTRAVENOUS | @ 10:00:00

## 2024-05-21 MED ADMIN — furosemide (LASIX) 160 mg in sodium chloride (NS) 0.9 % 50 mL IVPB: 160 mg | INTRAVENOUS | @ 21:00:00 | Stop: 2024-05-22

## 2024-05-21 MED ADMIN — ondansetron (ZOFRAN) injection 4 mg: 4 mg | INTRAVENOUS | @ 01:00:00

## 2024-05-21 MED ADMIN — propofol (DIPRIVAN) infusion 10 mg/mL: 0-80 ug/kg/min | INTRAVENOUS | @ 10:00:00 | Stop: 2024-05-22

## 2024-05-21 MED ADMIN — multivitamins, therapeutic with minerals tablet 1 tablet: 1 | ORAL | @ 14:00:00

## 2024-05-21 MED ADMIN — midodrine (PROAMATINE) tablet 20 mg: 20 mg | ORAL | @ 16:00:00 | Stop: 2024-05-21

## 2024-05-21 MED ADMIN — potassium chloride (KLOR-CON) packet 40 mEq: 40 meq | GASTROENTERAL | @ 16:00:00 | Stop: 2024-05-21

## 2024-05-21 MED ADMIN — sodium chloride (NS) 0.9 % flush 10 mL: 10 mL | INTRAVENOUS | @ 08:00:00

## 2024-05-21 MED ADMIN — micafungin (MYCAMINE) 100 mg in sodium chloride 0.9 % (NS) 100 mL IVPB-MBP: 100 mg | INTRAVENOUS | @ 21:00:00 | Stop: 2024-05-28

## 2024-05-21 MED ADMIN — potassium chloride 10 mEq in 100 mL IVPB: 10 meq | INTRAVENOUS | @ 18:00:00 | Stop: 2024-05-21

## 2024-05-21 MED ADMIN — heparin (porcine) 5,000 unit/mL injection 5,000 Units: 5000 [IU] | SUBCUTANEOUS | @ 20:00:00

## 2024-05-21 MED ADMIN — levETIRAcetam (KEPPRA) tablet 750 mg: 750 mg | ORAL | @ 01:00:00

## 2024-05-21 MED ADMIN — zinc sulfate (ZINCATE) capsule 220 mg: 220 mg | ORAL | @ 14:00:00 | Stop: 2024-06-04

## 2024-05-21 MED ADMIN — lactulose oral solution: 20 g | ORAL | @ 14:00:00

## 2024-05-21 MED ADMIN — insulin glargine (LANTUS) injection BASAL 60 Units: 60 [IU] | SUBCUTANEOUS | @ 01:00:00

## 2024-05-21 MED ADMIN — famotidine (PEPCID) tablet 20 mg: 20 mg | GASTROENTERAL | @ 14:00:00

## 2024-05-21 MED ADMIN — sodium chloride (NS) 0.9 % flush 10 mL: 10 mL | INTRAVENOUS | @ 01:00:00

## 2024-05-21 MED ADMIN — ursodiol (ACTIGALL) capsule 300 mg: 300 mg | ORAL | @ 01:00:00

## 2024-05-21 MED ADMIN — ursodiol (ACTIGALL) capsule 300 mg: 300 mg | ORAL | @ 14:00:00

## 2024-05-21 MED ADMIN — insulin lispro (HumaLOG) injection CORRECTIONAL 0-20 Units: 0-20 [IU] | SUBCUTANEOUS | @ 01:00:00

## 2024-05-21 MED ADMIN — NORepinephrine 8 mg in dextrose 5 % 250 mL (32 mcg/mL) infusion PMB: 0-30 ug/min | INTRAVENOUS | @ 10:00:00

## 2024-05-21 MED ADMIN — propofol (DIPRIVAN) infusion 10 mg/mL: 0-80 ug/kg/min | INTRAVENOUS | @ 16:00:00 | Stop: 2024-05-22

## 2024-05-21 MED ADMIN — potassium chloride 10 mEq in 100 mL IVPB: 10 meq | INTRAVENOUS | @ 16:00:00 | Stop: 2024-05-21

## 2024-05-21 MED ADMIN — clobetasol (TEMOVATE) 0.05 % ointment: TOPICAL | @ 01:00:00

## 2024-05-21 MED ADMIN — piperacillin-tazobactam (ZOSYN) IVPB (premix) 4.5 g: 4.5 g | INTRAVENOUS | @ 01:00:00 | Stop: 2024-05-21

## 2024-05-21 MED ADMIN — lactulose oral solution: 20 g | ORAL | @ 01:00:00

## 2024-05-21 MED ADMIN — furosemide (LASIX) 160 mg in sodium chloride (NS) 0.9 % 50 mL IVPB: 160 mg | INTRAVENOUS | @ 10:00:00 | Stop: 2024-05-21

## 2024-05-21 MED ADMIN — vasopressin 20 units in 100 mL (0.2 units/mL) infusion premade vial: .03 [IU]/min | INTRAVENOUS | @ 21:00:00

## 2024-05-21 MED ADMIN — piperacillin-tazobactam (ZOSYN) IVPB (premix) 4.5 g: 4.5 g | INTRAVENOUS | @ 21:00:00 | Stop: 2024-05-26

## 2024-05-21 MED ADMIN — furosemide (LASIX) injection 80 mg: 80 mg | INTRAVENOUS | @ 06:00:00 | Stop: 2024-05-21

## 2024-05-21 MED ADMIN — fentaNYL PF (SUBLIMAZE) (50 mcg/mL) infusion (bag): 0-200 ug/h | INTRAVENOUS | @ 11:00:00 | Stop: 2024-06-04

## 2024-05-21 MED ADMIN — midodrine (PROAMATINE) tablet 20 mg: 20 mg | ORAL | @ 21:00:00 | Stop: 2024-05-21

## 2024-05-21 MED ADMIN — pantoprazole (Protonix) EC tablet 40 mg: 40 mg | ORAL | @ 14:00:00

## 2024-05-21 MED ADMIN — hydrOXYzine (ATARAX) tablet 25 mg: 25 mg | ORAL | @ 01:00:00

## 2024-05-21 MED ADMIN — furosemide (LASIX) 160 mg in sodium chloride (NS) 0.9 % 50 mL IVPB: 160 mg | INTRAVENOUS | @ 16:00:00 | Stop: 2024-05-22

## 2024-05-21 MED ADMIN — midodrine (PROAMATINE) tablet 20 mg: 20 mg | ORAL | @ 14:00:00 | Stop: 2024-05-21

## 2024-05-21 MED ADMIN — piperacillin-tazobactam (ZOSYN) IVPB (premix) 4.5 g: 4.5 g | INTRAVENOUS | @ 16:00:00 | Stop: 2024-05-26

## 2024-05-21 MED ADMIN — potassium chloride 10 mEq in 100 mL IVPB: 10 meq | INTRAVENOUS | @ 17:00:00 | Stop: 2024-05-21

## 2024-05-21 MED ADMIN — chlorothiazide (DIURIL) 500 mg in sterile water injection: 500 mg | INTRAVENOUS | @ 21:00:00 | Stop: 2024-05-21

## 2024-05-21 MED ADMIN — ROCuronium (ZEMURON) injection: INTRAVENOUS | @ 10:00:00

## 2024-05-21 MED ADMIN — levETIRAcetam (KEPPRA) tablet 750 mg: 750 mg | ORAL | @ 14:00:00

## 2024-05-21 MED ADMIN — clobetasol (TEMOVATE) 0.05 % ointment: TOPICAL | @ 14:00:00

## 2024-05-21 NOTE — Unmapped (Signed)
 MICU Daily Progress Note     Date of Service: 05/21/2024    Problem List:   Principal Problem:    Shock     Active Problems:    Type 2 diabetes mellitus, with long-term current use of insulin        Hyperlipidemia    Hypertension    Coronary artery disease involving native heart without angina pectoris    Diabetes mellitus       Esophageal varices in cirrhosis       Hepatic encephalopathy       Calcified cerebral meningioma       Portal hypertension       Other chronic pain    OSA (obstructive sleep apnea)    Stage 3a chronic kidney disease (CMS-HCC)    Hematemesis      Interval history: Ashley Briggs is a 58 y.o. female with PMH CAD s/p NSTEMI, CKD, NAS cirrhosis c/b ascites, hepatic encephalopathy and EV s/p recent banding admitted with UGI bleed 2/2 bleeding esophageal ulcers s/p unsusccessful TIPS c/b pulsatile VT s/p shock requiring transfer to [facility] ICU. Course complicated by severe hemorrhagic shock 2/2 gastric bleed now s/p TIPS with course complicated by acute hypoxic respiratory failure, AKI, stress cardiomyopathy and hepatic encephalopathy. Hgb stable. Extubated 6/12 and weaned to RA. Remains in the MICU for pressor requirement.    24hr events:   -Worsening oxygen requirement refractory to BiPAP and thoracentesis  -Intubated due to increased work of breathing  -On pressors for MAP > 65 with diuresis   -Obtaining TIPS Doppler    Neurological   Analogous sedation  -Continue prop and fentanyl  gtt    History of seizure-like activity (stable)  - Home Keppra  750mg  BID    Chronic pain  History of chronic pain, treated with baclofen , naltrexone , oxycodone .   - Hold home baclofen , naltrextone, oxycodone  in the setting of hypotension and sedation  - Will slowly reintroduce some home medications due to bothersome neuropathic pain    Major depressive disorder (stable)  - Hold home bupoprion, citalopram     Insomnia (stable)  - Hold home zolpidem     PMH calcified cerebral meningioma (stable)  - no intervention at this time    Analgesia: pain adequately controlled    Pulmonary   Intubation and mechanical ventilation (resolved)  Previously intubated for procedures and altered mental status. Extubated 6/5, re-intubated later the same day for worsening hypoxia. Successfully extubated 6/12 and now weaned to RA-2L.  Patient again intubated on 6/20 overnight due to increasing oxygen requirements refractory to positive pressure and thoracentesis.  POCUS with diffuse B-lines concerning for volume overload, CXR with worsening moderate to severe pulmonary edema versus infectious process.   -Wean O2 as able    Pulmonary edema (worsening)  Increasing hypoxia on 6/5, ultimately requiring re-intubation with FiO2 100%. CXR demonstrated moderate pulmonary edema. Etiology of pulmonary edema most likely cardiogenic given concurrent suspected stress cardiomyopathy, though she is also at risk for ARDS and noncardiogenic pulmonary edema cannot be excluded.  Unfortunately not responding to Lasix  as above, will consider CRRT if necessary.  - CTM    Left pleural effusion (stable)  CXR 6/4 remarkable for moderate Left pleural effusion with passive atelectasis. S/p thoracentesis on 6/7. Pleural fluid c/w transudative effusion by protein parameters.   - CTM    Cardiovascular   Undifferentiated shock  Arrived to [facility] MICU in undifferentiated shock requiring 2 pressors. Acutely worsening hypotension on 6/4 requiring fourth pressor, aggressive fluid resuscitation, and massive transfusion protocol. S/p  emergent TIPS on 6/4. Isolated fever on 6/6, prerenal AKI, and central venous O2 nearly 90 concerning for some component of septic shock, though infectious workup remains unremarkable, and central venous O2 may be confounded in the setting of cirrhosis.  Intermittently on low-dose nor epi, however after intubation 6/20 pressor requirement slowly increasing.  Shock likely multifactorial iso sepsis and volume.  Noted to have rising creatinine and bilirubin on 6/22 also c/w  ongoing shock.  Continuing pressors for improved perfusion treating with antibiotics as below.  - Continue midodrine  20mg  q8  - Abx: Zosyn  (6/14 - 6/20) - re-dose 6/21 due to concern for pna     Stress cardiomyopathy - Demand Ischemia   With elevated troponin on admission (5.4k) likely 2/2 demand ischemia in the setting of shock. Echo on admission overall normal with EF > 55%. Developed several episodes of wide complex tachycardia on 6/5. Bedside POCUS with apical ballooning and slightly decreased EF to 45-50%. EKG was without acute ischemic changes. Troponin peaked at 23k (6/6). Formal TTE demonstrated reduced EF to 45% with basal and midventricular hypokinesis. Findings favored to represent stress cardiomyopathy rather than ACS in the absence of acute EKG changes. Repeat TTE 6/10 with EF 40%, marginally decreased from prior.   -Repeat TTE in 3 months    Wide complex tachycardia - Hx ventricular tachycardia  Developed VT arrest requiring shock at OSH with initial TIPS attempt. On 6/5 developed several episodes of wide complex tachycardia. EKG with tachycardia to 130s, thought by cardiology to represent a fib with abberancy rather than VT. She was stabilized with amiodarone  bolus, followed by amiodarone  gtt and then PO amio. Finished amio load 6/12, no role for amio moving forward as this was precipitated iso critical illness.   - Daily CMP  - Replete K>4, Mg>2    CAD, prior NSTEMI  Prior NSTEMI per chart review. No LHC data available. Unclear if she has history of coronary stent.   - Home aspirin  held ISO bleeding    Renal   AKI   Had course of Cr elevation to 1.6 from baseline ~0.8 around 6/7. Urine lytes most consistent with prerenal etiology at that time, 2/2 hypoperfusion iso shock, unlikely HRS. Cr normalized, adequate UOP, but worsened again 6/20 with concomitant rise in bilirubin. Concern is for pre-renal injury again given our lax BP goal of SBP >85.  Despite aggressive diuresis with Lasix  160 mg, creatinine stable but urine output is minimal.  Will continue diuresis and consider addition of diuretics versus metolazone though if unable to remove volume, will require CRRT.  - Monitor Cr    Infectious Disease/Autoimmune   Aspiration pneumonia  Presented to [facility] MICU in 2-pressor shock with leukocytosis to 24.9, infectious source never identified.. With fever to 38.4 on 6/6. Paracentesis with PMNs < 250. Thoracentesis transudative. UA noninfectious. Lrcx gram stain with 1+ yeast, 1+ GPC. Fluctuating single pressor requirement since 6/9, repeat infectious workup negative. Had possible aspiration event overnight into 6/15 for which Zosyn  was resumed, she is clinically improved since starting antibiotics, anticipate full treatment course for aspiration pneumonia for 7 days.  Due to acute hypoxemia, will redosed Zosyn  due to concern for aspiration.  - Bcx ngtd  - LRCx  - Zosyn  (6/14 - 6/20)    Concern for urinary infection  Recently grew Candida in her urine.  Unclear if this is contributing to her ongoing septic picture.  Will obtain repeat UA and start empiric micafungin  for now.  -Start micafungin     Cultures:  Blood Culture, Routine (no units)   Date Value   05/14/2024 No Growth at 5 days   05/14/2024 No Growth at 5 days     Urine Culture, Comprehensive (no units)   Date Value   05/14/2024 10,000 to 50,000 CFU/mL Candida glabrata (A)   05/04/2024 NO GROWTH     Lower Respiratory Culture (no units)   Date Value   05/07/2024 OROPHARYNGEAL FLORA ISOLATED   05/06/2024 Specimen Not Processed     WBC (10*9/L)   Date Value   05/21/2024 6.6     WBC, UA (/HPF)   Date Value   05/10/2024 25 (H)          FEN/GI   Decompensated cirrhosis c/b esophageal varices, gastric varix, hepatic encephalopathy, ascites   Presenting with hematemesis 6 days after esophageal banding. Emergent TIPS at OSH not successful d/t inability to access the portal vein. EGD on 6/4 demonstrated nonbleeding esophageal ulcers at site of prior banding, stomach completely occluded by blood which could not be cleared endoscopically. With acute decline in Hb and hypotension on 6/4. Lasandra attempted with GI, but unsuccessful after numerous attempts. S/p salvage TIPS on 6/4 with no further GI bleeding.  Minimal response to aggressive diuresis, considering CRRT as above.   - Hepatology following  - Daily MELLD labs (CMP, PT/INR)  - Maintain fibrinogen > 100 with cryo  - Home spironolactone  held d/t hypotension   - Diuresis as above  - Follow up repeat TIPS US   - Continue rifaximin , continue lactulose  20g BID for goal 3-5 Bms daily    Acute on chronic liver failure - Ischemic hepatitis  LFTs rose s/p variceal hemorrhage and TIPS. Unfortunately, precluded from transplant eval with newly reduced EF. LFTs slowly improved, suggesting ongoing recovery from TIPS and hemorrhagic event. Bilirubin slowly becoming elevated again, first noted 6/20.  - Hepatology following    Malnutrition Assessment:   Tube feeds started 6/9.  Passed swallow study, on regular diet and eating well.  - Continue full diet       Heme/Coag   Blood loss anemia, likely 2/2 refractory portal hypertensive bleeding (resolved)  S/p 3 units pRBCs at OSH, Hb 9.3 on arrival to MICU. S/p massive transfusion protocol on 6/4. Low clinical concern for ongoing blood loss with stable Hb, last transfusion 6/4.   - Daily CBC    Mild Thrombocytopenia (stable)  Plts 253k on admission, acute decline to 50k on 6/5. Currently without signs of active bleeding. Plts remain stable around ~100-150k.  - Daily CBC    Endocrine   Insulin -dependent T2DM  A1c 5.6 on admission. Home regimen lantus  60, lispro with meals. BG control challenging in the setting of critical illness and stress dose steroids. S/p insulin  infusion, converted to NPH 6/8.  Continuing to titrate insulin  while off continuous tube feeds and p.o. only.  Will switch back to NPH postintubation and resuming tube feeds for better titration.  - Start NPH 30 units twice daily  - Continue lispro 8u TIDAC  - Lispro SSI QID    Integumentary   Psoriasis (stable)  History of psoriasis per chart review. With numerous chronic appearing scattered erythematous plaques with silvery scale. Of note, on Secukinumab  for psoriasis management, which can increase risk for infection.  -Continue clobetasol      # Irritant contact dermatitis (buttocks)  # Wound, R upper anterior leg  # Irritant contact dermatitis (anterior pelvis)  # Intertrigo along pannus  # Scalp wound 2/2 psoriasis  - WOCN consulted for high risk  skin assessment Yes.  - Recommendations per WOCN note:  WOCN Recommendations:   - See nursing orders for wound care instructions.  - Contact WOCN with questions, concerns, or wound deterioration.  Topical Therapy/Interventions:   - Absorbent Dressing  - Antimicrobial Solutions  - Crusting (stoma powder or antifungal powder)  - Hydrofiber  - Zinc oxide barrier cream      Prophylaxis/LDA/Restraints/Consults   ICU checklist completed: yes (see ICU rounding navigator in Epic)    Patient Lines/Drains/Airways Status       Active Active Lines, Drains, & Airways       Name Placement date Placement time Site Days    ETT  7.5 05/21/24  0616  -- less than 1    CVC Triple Lumen 05/21/24 Non-tunneled Left Internal jugular 05/21/24  1445  Internal jugular  less than 1    Open Drain Inferior;Lateral;Right Back 05/14/24  1357  Back  7    Urethral Catheter Straight-tip 05/21/24  0300  Straight-tip  less than 1    Peripheral IV 05/04/24 Anterior;Left;Upper Arm 05/04/24  0500  Arm  17    Peripheral IV 05/04/24 Anterior;Right;Upper Arm 05/04/24  0500  Arm  17    Peripheral IV 05/21/24 Anterior;Right Forearm 05/21/24  0200  Forearm  less than 1                  Patient Lines/Drains/Airways Status       Active Wounds       Name Placement date Placement time Site Days    Surgical Site 05/04/24 Abdomen Lower;Right 05/04/24  2300  -- 16    Surgical Site 05/04/24 Neck Right 05/04/24  2300  -- 16 Surgical Site 05/04/24 Groin Right 05/04/24  2300  -- 16    Wound 05/12/24 Irritant Contact Dermatitis Rash/Dermatitis Buttocks 05/12/24  1220  Buttocks  9    Wound 05/12/24 Other (Comment) Leg Anterior;Right;Upper 05/12/24  1225  Leg  9    Wound 05/12/24 Irritant Contact Dermatitis Perspiration Pelvis Anterior;Right Intertrigo along pannus 05/12/24  1226  Pelvis  9    Wound 05/12/24 Other (Comment) Head Posterior;Upper 05/12/24  1227  Head  9                    Goals of Care     Code Status: Full Code    Designated Healthcare Decision Maker:  Ms. Troop current decisional capacity for healthcare decision-making is Full capacity. Her designated healthcare decision maker(s) is/are   HCDM (patient stated preference): Caudle,Ryan - Relative - [phone].      Subjective     Intubated overnight for worsening effusion and volume overload despite BiPAP and thoracentesis.  __________________________________________________________________     Vitals - past 24 hours  Temp:  [34.8 ??C (94.6 ??F)-36.3 ??C (97.3 ??F)] 36.3 ??C (97.3 ??F)  Pulse:  [75-115] 97  SpO2 Pulse:  [73-102] 97  Resp:  [14-26] 24  BP: (77-137)/(26-66) 98/42  FiO2 (%):  [40 %-100 %] 80 %  SpO2:  [85 %-100 %] 92 % Intake/Output  I/O last 3 completed shifts:  In: 512.5 [I.V.:10; IV Piggyback:502.5]  Out: 930 [Urine:930]     Physical Exam:    General: Chronically ill-appearing female, lying in bed.   HEENT: Mucous membranes dry. Sclerae icteric.   CV: RRR  Pulm: CTAB on anterior exam.  GI: Normoactive bowel sounds. Soft, distended. Non-tender to palpation  Extremities: Trace edema BLE.   Skin: Diffuse scattered erythematous plaques with silvery scale. No bruising, petechiae,  mucocutaneous bleeding.  Neuro: AOx4    Continuous Infusions:    fentaNYL  citrate (PF) 50 mcg/mL infusion 50 mcg/hr (05/21/24 0721)    NORepinephrine  bitartrate-NS 16 mcg/min (05/21/24 1724)    propofol  10 mg/mL infusion Stopped (05/21/24 1725)    vasopressin  0.03 Units/min (05/21/24 1643) Scheduled Medications:    [Provider Hold] albumin  human 25 %  75 g Intravenous Q12H SCH    clobetasol    Topical BID    [START ON 05/27/2024] ergocalciferol-1,250 mcg (50,000 unit)  1,250 mcg Oral Weekly    famotidine   20 mg Enteral tube: gastric BID    furosemide   160 mg Intravenous Q6H    heparin  (porcine) for subcutaneous use  5,000 Units Subcutaneous Q8H SCH    insulin  lispro  10 Units Subcutaneous 3xd Meals    insulin  lispro  0-20 Units Subcutaneous ACHS    insulin  NPH  30 Units Subcutaneous Q12H SCH    lactulose   20 g Oral BID    levETIRAcetam   750 mg Oral BID    melatonin  3 mg Oral QPM    micafungin   100 mg Intravenous Q24H    midodrine   20 mg Oral TID    multivitamins (ADULT)  1 tablet Oral Daily    NORepinephrine  bitartrate-D5W        pantoprazole   40 mg Oral Daily    piperacillin -tazobactam  4.5 g Intravenous Q8H    sodium chloride   10 mL Intravenous Q8H    sodium chloride   10 mL Intravenous Q8H    sodium chloride   10 mL Intravenous Q8H    ursodiol   300 mg Oral BID    zinc sulfate  220 mg Oral Daily       PRN medications:  acetaminophen , dextrose  in water , fentaNYL  (PF) **OR** fentaNYL  (PF), glucagon, glucose, hydrOXYzine , melatonin, NORepinephrine  bitartrate-D5W, ondansetron     Data/Imaging Review: Reviewed in Epic and personally interpreted on 05/21/2024. See EMR for detailed results.    Aloha Rowels, MD

## 2024-05-21 NOTE — Unmapped (Addendum)
 Adult Nutrition Assessment, Brief Note      Visit Type: MD Consult  Reason for Visit: Assessment (Nutrition)    Recommendations:  For tube feeds to meet updated est needs below, Recommend Vital High Protein at goal rate 60 mL/hr. This provides 1260 kcals, 110 g protein, 140 g carbohydrate, 30 g fat, 0 g fiber, 1054 mL free water , and meets 84% USRDI.  Start at 10ml/hr and increase by 20-30ml q4hrs to goal  FWF 30ml q 4hrs for tube patency.  Please weigh patient-last done 3 weeks ago  DC regular diet and glucerna shakes  Continue zinc and vitamin D supplementation for deficiencies noted below    Interval Progress:  Pt currently followed by MICU RD. Pts hospital course, labs, meds reviewed. Last seen 6/18 where pt was tolerating a regular diet. 6/20 acutely developed hypoxemia and found to have a large PE. Intubated early this morning 6/21. Also with AKI from hypoperfusion in the setting of shock. Hyponatremic likely in part due to cirrhosis/ascites/volume overload. Requiring low dose norepinephrine  (2mcg/min) and low dose propofol  providing minimal calories. Enteral access placed and confirmed in the stomach. Vital AF ordered at 36ml/hr.    Additional pertinent meds include:  Lantus -60u nightly  Lactulose   Lasix   Multivitamin  Zinc acetate started 6/21  Vitamin D started 6/21    Relevant Nutrition Data:  Energy: [phone] kcals 11-14 kcal/kg (per ASPEN/SCCM guidelines for the critically ill obese, BMI 30-50) using last recorded weight, 89 kg (05/21/24 1106)]  Protein: 110 gm [> 2 gm/kg (per ASPEN/SCCM guidelines for the critically ill obese, BMI 30-39.9) using ideal body weight, 55 kg (05/21/24 1106)]    **no minute ventilation data documented yet 6/21 am to calculate PSU     Nutritionally pertinent labs reviewed.   Lab Results   Component Value Date    NA 132 (L) 05/21/2024    K 2.8 (L) 05/21/2024    CL 98 05/21/2024    CO2 18.0 (L) 05/21/2024    BUN  05/21/2024      Comment:      Specimen icteric.     CREATININE 1.38 (H) 05/21/2024    GFR >= 60 01/31/2011    GLU 176 (H) 05/21/2024    CALCIUM  9.1 05/21/2024    ALBUMIN  3.1 (L) 05/21/2024    PHOS 4.4 05/21/2024        Recent Labs     Units 05/21/24  0317   MG mg/dL 2.2       Lab Results   Component Value Date    ALKPHOS 90 05/21/2024    BILITOT 20.9 (H) 05/21/2024    BILIDIR 9.60 (H) 05/13/2024    PROT  05/21/2024      Comment:      Specimen icteric.     ALBUMIN  3.1 (L) 05/21/2024    ALT 27 05/21/2024    AST 40 (H) 05/21/2024    No results for input(s): TRIG in the last 168 hours.    Latest Reference Range & Units 05/16/24 11:51   Zinc 60 - 106 mcg/dL 34 (L)   (L): Data is abnormally low   Latest Reference Range & Units 05/16/24 11:51   Vitamin D Total (25OH) 20.0 - 80.0 ng/mL 9.9 (L)   (L): Data is abnormally low  Will continue to follow as planned, 1-2x/week, or more often if needed.     Damien Aden, MS, RD, LDN, CNSC  Pager: [phone]

## 2024-05-21 NOTE — Unmapped (Signed)
 0002  Pt was placed on CPAP due to OSA and minor oxygen requirements.    0130 pt oxygen requirement increased due to fluid, increase CPAP, however BIPAP was appropriate  Fluid was drain, titrated BIPAP settings and oxygen.    0515 pt desats, RT increase setting and fio2.  Pt oxygen requirement because severe and her work of breathing increase.  Pt was intubated with 7.5 ETT, 23@ lip.  BS are diminished and yellow, thin secretions were present above and below the cuff.  Vent setting were PRVC 450 24 +5 100%. Pt found @ 25@ lip upon shift change.  Spoke with Dr about withdrawing it bk to 23.  Chest xray confirmed.  Titrate rate per ABG.  Airway remain secure and patent.  No distress noted.          Problem: Mechanical Ventilation Invasive  Goal: Effective Communication  Outcome: Ongoing - Unchanged     Problem: Artificial Airway  Goal: Effective Communication  Outcome: Ongoing - Unchanged

## 2024-05-21 NOTE — Unmapped (Signed)
 ADVANCE CARE PLANNING NOTE    Discussion Date:  May 21, 2024    Patient has decisional capacity:  No    Patient has selected a Health Care Decision-Maker if loses capacity: Yes    Health Care Decision Maker as of 05/21/2024    HCDM (patient stated preference): Caudle,Ryan - Relative - [phone]  Perley Handing    Discussion Participants:  Perley Handing    Communication of Medical Status/Prognosis:   Spoke with Handing (joint HCDM) over the phone, explained that Ms. Tye in the last 24 hours has decompensated -- she is in multi organ failure with cirrhosis, acute renal failure, cardiac dysfunction and ARDS. Her prognosis is very poor, unlikely that she will survive hospital stay.     Communication of Treatment Goals/Options:   As above, I do not think that if we did chest compression and shocks that she has any reversible disease (cirrhosis, renal failure) and that she has already proved prior to this that its unlikely she would survive the hospital stay. Handing agreed with me, that shocks and chest compressions would not be within her goals for [facility]. They do not want to deescalate until family is able to get to the hospital  (~2 hours).     Treatment Decisions:   - DNR, continue pressors until family arrives, continue maximum support until family arrives          I spent 15 minutes providing voluntary advance care planning services for this patient.

## 2024-05-21 NOTE — Unmapped (Signed)
 Central Venous Catheter Insertion Procedure Note     Date of Service: 05/21/2024    Patient Name: Ashley Briggs  Patient MRN: [id number]    Line type:  Hemodialysis Catheter, dual lumen    Indications:  Hemodialysis    Consent:  I explained the potential benefits and risks of the procedure, including  catheter malposition, pneumothorax, cardiac complications, thrombosis, hematoma formation, arterial cannulation, air embolism local/catheter-related infection, nerve/tissue damage and sepsis. I explained potential alternatives. The patient/HCDM understands these risks, agrees to the procedure, and signed the informed consent form.    Procedure Details:   Time-out was performed immediately prior to the procedure.    The right internal jugular vein was identified using bedside ultrasound. This area was prepped and draped in the usual sterile fashion. Maximum sterile technique was used including antiseptics, cap, gloves, gown, hand hygiene, mask, and sterile sheet.  The patient was placed in Trendelenburg position. Local anesthesia with 1% lidocaine  was applied subcutaneously then deep to the skin. The finder was then inserted into the internal jugular vein using ultrasound guidance.    Using the Seldinger Technique a Hemodialysis Catheter was placed with each port easily flushed and freely drawing venous blood.    The catheter was secured with sutures. A sterile CHG drsg was applied to the site.    Condition:  The patient tolerated the procedure well and remains in the same condition as pre-procedure.    Complications:  None; patient tolerated the procedure well.    Plan:  CXR confirmed appropriate placement of central line. CVAD order placed OK to use     Lexii Walsh A Ketan Renz, MD

## 2024-05-21 NOTE — Unmapped (Signed)
 Continuous Renal Replacement  Dialysis Nurse Therapy Procedure Note    Treatment Type:  [facility] Number Of Days On Therapy:   Procedure Date:  05/21/2024 11:22 PM     TREATMENT STATUS:  Initiated   None       Active Dialysis Orders (168h ago, onward)       Start     Ordered    05/21/24 1925  CRRT Orders - NxStage (Adult)  Continuous        Comments: Fluid removal parameters:   MAP <60: 10 ml/hr;  MAP 61 - 65: 50-100 ml/hr   MAP > 65: 100-150 ml/hr;   Question Answer Comment   CRRT System: NxStage    Modality: CVVH    Access: Right Subclavian    BFR (mL/min): 200-350    Dialysate Flow Rate (mL/kg/hr): Other (Specify) 1.7 L/hr   CRRT Circuit Anticoagulation Other (Specify) n/a   Fluid Removal Initial Rate (mL/hr): 10        05/21/24 1925                  SYSTEM CHECK:  Machine Name: Other (Comment) [phone])  Dialyzer: [id number]   Self Test Completed: Yes.        Alarms Connected To The Wall And Active:  No.    VITAL SIGNS:  Temp:  [36.3 ??C (97.3 ??F)] 36.3 ??C (97.3 ??F)  Core Temp:  [35.4 ??C (95.7 ??F)-36.9 ??C (98.4 ??F)] 36.8 ??C (98.2 ??F)  Pulse:  [68-115] 99  SpO2 Pulse:  [69-114] 99  Resp:  [7-31] 23  SpO2:  [85 %-100 %] 100 %  BP: (75-137)/(13-78) 81/44  MAP (mmHg):  [31-87] 47  A BP-2: (84-90)/(41-54) 87/54  MAP:  [56 mmHg-67 mmHg] 67 mmHg    ACCESS SITE:        Hemodialysis Catheter With Distal Infusion Port 05/21/24 Right Internal jugular (Active)   Site Assessment Clean;Dry;Intact 05/21/24 2252   Proximal Lumen Status / Patency Blood Return - Brisk 05/21/24 2252   Proximal Lumen Intervention Accessed 05/21/24 2252   Medial Lumen Status / Patency Blood Return - Brisk 05/21/24 2252   Medial Lumen Intervention Accessed 05/21/24 2252   Dressing Type CHG gel;Occlusive;Transparent 05/21/24 2252   Dressing Status      Clean;Dry;Intact/not removed 05/21/24 2252   Dressing Intervention No intervention needed 05/21/24 2252   Contraindicated due to: Dressing Intact surrounding insertion site 05/21/24 2252   Dressing Change Due 05/28/24 05/21/24 2252   Line Necessity Reviewed? Y 05/21/24 2252   Line Necessity Indications Yes - Hemodialysis 05/21/24 2252   Line Necessity Reviewed With nephrology 05/21/24 2252          CATHETER FILL VOLUMES:     Arterial: 1.5 mL  Venous: 1.7 mL     Lab Results   Component Value Date    NA 133 (L) 05/21/2024    K 3.3 (L) 05/21/2024    CL 98 05/21/2024    CO2 18.0 (L) 05/21/2024    BUN  05/21/2024      Comment:      Specimen icteric.      Lab Results   Component Value Date    CALCIUM  9.1 05/21/2024    CAION 3.98 (L) 05/21/2024    PHOS 4.4 05/21/2024    MG 2.2 05/21/2024        SETTINGS:  Blood Pump Rate: 300 mL/min  Replacement Fluid Rate:     Pre-Blood Pump Fluid Rate:    Hourly Fluid Removal Rate: 10 mL/hr  Dialysate Fluid Rate    Therapy Fluid Temperature:       ANTICOAGULANT:  None    ADDITIONAL COMMENTS:  None    HEMODIALYSIS ON-CALL NURSE PAGER NUMBER:  Monday thru Saturday 0700 - 1730: Call the Dialysis Unit ext. [phone]   After 1730 and all day Sunday: Call the Dialysis RN Pager Number [phone]     PROCEDURE REVIEW, VERIFICATION, HANDOFF:  CRRT settings verified, procedure reviewed, and instructions given to primary RN.     Primary CRRT RN Verifying: Marolyn Griffith, RN Dialysis RN Verifying: CANDIE Pyles, RN

## 2024-05-21 NOTE — Unmapped (Signed)
 Vancomycin  Therapeutic Monitoring Pharmacy Note    Ashley Briggs is a 58 y.o. female starting vancomycin . Date of therapy initiation: 05/21/24    Indication: Bacteremia/Sepsis    Prior Dosing Information: None/new initiation     Goals:  Therapeutic Drug Levels  Vancomycin  trough goal: 10-15 mg/L    Additional Clinical Monitoring/Outcomes  Renal function, volume status (intake and output)    Results: Not applicable    Wt Readings from Last 1 Encounters:   05/04/24 88.8 kg (195 lb 12.3 oz)     Creatinine   Date Value Ref Range Status   05/21/2024 1.38 (H) 0.55 - 1.02 mg/dL Final   93/79/7974 8.65 (H) 0.55 - 1.02 mg/dL Final   93/79/7974 8.63 (H) 0.55 - 1.02 mg/dL Final        Pharmacokinetic Considerations and Significant Drug Interactions:  Adult (estimated initial): Vd = 63.048 L, ke = 0.0458 hr-1  Concurrent nephrotoxic meds: not applicable    Assessment/Plan:  Recommendation(s)  Start vancomycin  IV 1750 mg x1   Estimated trough on recommended regimen: Not applicable - dosing by level    Follow-up  Level due: tomorrow with AM labs  A pharmacist will continue to monitor and order levels as appropriate    Please page service pharmacist with questions/clarifications.    Keeghan Mcintire, PharmD   PGY1 Ambulatory Care Pharmacy Resident

## 2024-05-21 NOTE — Unmapped (Signed)
 Central Venous Catheter Insertion Procedure Note     Date of Service: 05/21/2024    Patient Name: Ashley Briggs  Patient MRN: [id number]    Line type:  Triple Lumen    Indications:  Medications requiring central access    Consent:  I explained the potential benefits and risks of the procedure, including  catheter malposition, pneumothorax, cardiac complications, thrombosis, hematoma formation, arterial cannulation, air embolism local/catheter-related infection, nerve/tissue damage and sepsis. I explained potential alternatives. The patient/HCDM understands these risks, agrees to the procedure, and signed the informed consent form.    Procedure Details:   Time-out was performed immediately prior to the procedure.    The left internal jugular vein was identified using bedside ultrasound. This area was prepped and draped in the usual sterile fashion. Maximum sterile technique was used including antiseptics, cap, gloves, gown, hand hygiene, mask, and sterile sheet.  The patient was placed in Trendelenburg position. Local anesthesia with 1% lidocaine  was applied subcutaneously then deep to the skin. The introducer was then inserted into the internal jugular vein using ultrasound guidance.    Using the Seldinger Technique a Triple Lumen was placed with each port easily flushed and freely drawing venous blood.    The catheter was secured with sutures. A sterile CHG drsg was applied to the site.    Condition:  The patient tolerated the procedure well and remains in the same condition as pre-procedure.    Complications:  None; patient tolerated the procedure well.    Plan:  CXR confirmed appropriate placement of central line. CVAD order placed OK to use     Aloha Rowels, MD

## 2024-05-21 NOTE — Unmapped (Signed)
 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 The patient is seen in consultation at the request of Ashley Jackquline Rubin, MD (Medical ICU ([facility])) for decompensated cirrhosis with variceal bleeding.    Patient with significant UGIB likely 2/2 gastric varix that was unable to be controlled endoscopically. Patient underwent successful salvage TIPS 6/4 and has stopped bleeding. Unfortunately, she was re-intubated (likely iso worsening HE), extubated and found to have stress CM. She was reintubated 6/21 due to volume overload likely iatrogenic. Is now on IV diuresis with consideration of CRRT per nephrology. Continue supportive care for ACLF.     Recommendations:  - Daily MELD labs  - Agree with diuresis   - Continue supportive care, she will need to have repeat cardiac eval with recovery of EF (stress CM) before we can proceed with LT evaluation  - Cont PO PPI BID (home med)  - Continue lactulose  for 3-5 bowel movements daily and rifaximin      Issues Impacting Complexity of Management:  -The patient has the need for intensive monitoring parameter(s) due to high-risk of clinical decline: q4h or more frequent monitoring of hemoglobins to monitor for stability of GI bleeding and frequent monitoring of MELD score to monitor for progression to/progression of acute or acute on chronic liver failure    Recommendations discussed with the patient's primary team. We will continue to follow along with you.    Subjective:   - hypoxic respiratory failure overnight, started on CPAP then BiPAP, then got thoracentesis 1.5L, then back on BiPAP and eventually intubated this morning  - started on IV lasix    - on NE 2    Unable to obtain history as patient is intubated.     Objective:   Temp:  [34.8 ??C (94.6 ??F)-36.3 ??C (97.3 ??F)] 36.3 ??C (97.3 ??F)  Pulse:  [74-115] 101  SpO2 Pulse:  [68-102] 100  Resp:  [14-25] 22  BP: (77-137)/(26-66) 122/65  FiO2 (%):  [40 %-100 %] 80 %  SpO2:  [85 %-100 %] 96 %    Gen: Ill appearing  HEENT: intubated   Abdomen:  soft, moderately distended     Pertinent Labs & Studies:  -I have reviewed the patient's labs from 05/21/24 which show stable Hgb, worsening LFTs, and stable INR  MELD 3.0: 32 at 05/21/2024  3:17 AM  MELD-Na: 32 at 05/21/2024  3:17 AM  Calculated from:  Serum Creatinine: 1.38 mg/dL at 3/78/7974  6:82 AM  Serum Sodium: 128 mmol/L at 05/21/2024  3:17 AM  Total Bilirubin: 20.9 mg/dL at 3/78/7974  6:82 AM  Serum Albumin : 3.1 g/dL at 3/78/7974  6:82 AM  INR(ratio): 1.85 at 05/21/2024  3:17 AM  Age at listing (hypothetical): 57 years  Sex: Female at 05/21/2024  3:17 AM

## 2024-05-21 NOTE — Unmapped (Signed)
 Nephrology Consult Note    Requesting Attending Physician :  Clotilda Jackquline Rubin, MD  Service Requesting Consult : Medical ICU (MDI)  Reason for Consult: CRRT assessment    Assessment and Plan:    # AKI  - baseline Cr 0.6-0.8  - Cr up to 1.36 since 6/20 has remained stable  - etiology of AKI in the setting of hypotension (multiple MAPs <65, 6/19 PM majority of readings in the 40-50s; lowest of 39). Urine Na <10 further supporting hypoperfusion state. Normal amounts of bowel movements for cirrhosis---PO intake appears fair on my review; no recent abdominal imaging. Finally, ECHO on 6/10 showed EF 40% w/ normal RV  - patient now intubated w/ worseing AHRF in the setting of worsening pulmonary edema (recently received large amounts of albumin ) & pleural effusion that is now s/p drainage. Evidence of significant anasarca especially in the lower extremities; on 100% FiO2  - patient currently receiving Lasix  160 mg q6h  - recommendations:   - agree w/ IV Lasix  160 mg q6h--would aim for NN of 1-2; can consider the addition of Diuril if needed   - if UOP not at goal despite high dose diuretics can consider CRRT   - please send urine sediment to station #[id number]   - please obtain formal renal UA   - supportive measures: MAPs >65-70, avoiding nephrotoxic medications, & contrasted images (unless needed)   - renally dose medications for eGFR <30   - continue strict I&Os    # Electrolytes & Acid/Base  - hyponatremia: suspect 2/2 to hypoperfusion state leading to increased ADH activation   - diuretics as above, hopefully should improve   - fluid restriction of 1 L (IV included)   - goal AM Na 134-136  - monitor K+ w/ ongoing diuretics. K+ goal >4    # Anemia  - per primary  - transfusion Hgb <7    # HTN/Volume Status  - on pressors  - significant hypervolemia on exam  - diuretics as above    # AHRF  # Decompensated Cirrhosis  # GI Bleed s/p TIPS c/b VT Arrest  - Evaluation and management per primary team  - No changes to management from a nephrology standpoint at this time    RECOMMENDATIONS:   - continue Lasix  as above; consideration of Diuril if needed; NN goal 1-2 L  - urine sediment to station #[id number]  - formal renal US   - supportive measures + strict I&Os  - renally dose medications for eGFR <30  - AM Na goal 134-136  - 1 L fluid restriction for ALL fluids  - We will continue to follow.     Redell JAYSON Dory, DO  05/21/2024 1:02 PM     Medical decision-making for 05/21/24  Findings / Data     Patient has: []  acute illness w/systemic sxs  [mod]  []  two or more stable chronic illnesses [mod]  []  one chronic illness with acute exacerbation [mod]  []  acute complicated illness  [mod]  []  Undiagnosed new problem with uncertain prognosis  [mod] [x]  illness posing risk to life or bodily function (ex. AKI)  [high]  []  chronic illness with severe exacerbation/progression  [high]  []  chronic illness with severe side effects of treatment  [high] AKI Probs At least 2:  Probs, Data, Risk   I reviewed: [x]  primary team note  []  consultant note(s)  []  external records [x]  chemistry results  [x]  CBC results  []  blood gas results  []  Other []  procedure/op  note(s)   []  radiology report(s)  []  micro result(s)  []  w/ independent historian(s) Primary notes; stable Cr >=3 Data Review (2 of 3)    I independently interpreted: []  Urine Sediment  []  Renal US  []  CXR Images  []  CT Images  []  Other []  EKG Tracing N/A Any     I discussed: []  Pathology results w/ QHPs(s) from other specialties  []  Procedural findings w/ QHPs(s) from other specialties []  Imaging w/ QHP(s) from other specialties  [x]  Treatment plan w/ QHP(s) from other specialties Plan discussed with primary team Any     Mgm't requires: []  Prescription drug(s)  [mod]  []  Kidney biopsy  [mod]  []  Central line placement  [mod] [x]  High risk medication use and/or intensive toxicity monitoring [high]  []  Renal replacement therapy [high]  []  High risk kidney biopsy  [high]  []  Escalation of care  [high]  []  High risk central line placement  [high] IV Diuresis: check BMP/Mg  Risk      ____________________________________________________    History of Present Illness: Ashley Briggs is 58 y.o. female with CAD s/p NSTEMI, MASH cirrhosis c/b ascites, HE, EV who is seen in consultation at the request of Clotilda Jackquline Rubin, MD and Medical ICU (MDI). Nephrology has been consulted for AKI.     Presented to OSH with upper GI bleed from esophageal ulcers with failed attempt at TIPS c/b VT arrest requiring defibrillation, tx to [facility] on 6/4 and ultimately underwent successful TIPS on 6/4. Course has been complicated by AHRF (extubated 6/12), AKI, stress cardiomyopathy, and hepatic encephalopathy as well as ongoing vasoplegia     She was treated with 125g of 25% albumin  on 6/20 but unfortunately has had diminished UOP since pressors held ~6/17-6/18 (Previously >1 L, now ~400cc range)    Overnight, rapidly developed hypoxemia and increased wob with new evidence of large pleural effusion completely opacifying L lung and shifting mediastinum. She was started on BiPAP and ultimately underwent L thoracentesis for 1500cc with improvement in symptoms, hypoxemia, however has subsequently worsened to 65% Bipap & now intubated.    INPATIENT MEDICATIONS:  Current Medications[1]    OUTPATIENT MEDICATIONS:  Prior to Admission medications   Medication Dose, Route, Frequency   canagliflozin  (INVOKANA ) 300 mg Tab tablet 300 mg, Oral, Daily before breakfast   acetaminophen  (TYLENOL ) 325 MG tablet 650 mg, Oral, 2 times a day PRN   aspirin  (ECOTRIN) 81 MG tablet 81 mg, Oral, Nightly   baclofen  (LIORESAL ) 5 mg Tab tablet 5 mg, Oral, Nightly   blood sugar diagnostic Strp Other, 3 times a day [facility]), For glucometer brand preferred by patient   blood-glucose sensor (FREESTYLE LIBRE 3 SENSOR) Devi Use 1 sensor every 15 days   buPROPion  (WELLBUTRIN  XL) 150 MG 24 hr tablet 150 mg, Nightly   carvedilol  (COREG ) 6.25 MG tablet 6.25 mg, Oral, 2 times a day (standard)   empty container Misc Use as directed to dispose of Cosentyx  pens.   furosemide  (LASIX ) 40 MG tablet 40 mg, Oral, Daily (standard)   insulin  glargine (BASAGLAR , LANTUS ) 100 unit/mL (3 mL) injection pen 36 Units, Subcutaneous, Nightly  Patient taking differently: Inject 0.6 mL (60 Units total) under the skin nightly.   insulin  lispro (HUMALOG ) 100 unit/mL injection pen 8 Units, Subcutaneous, 3 times a day (AC)   lancets (ACCU-CHEK SOFTCLIX LANCETS) Misc Use as directed to test once daily.   levETIRAcetam  (KEPPRA ) 500 MG tablet 750 mg, Oral, 2 times a day (standard)   lidocaine  HCL-menthoL (NERVIVE  PAIN RELIEVING) 4-1 % srlo Apply topically.   magnesium  oxide (MAG-OX) 400 mg (241.3 mg elemental magnesium ) tablet 800 mg, Oral, 2 times a day (standard)   metFORMIN  (GLUCOPHAGE -XR) 500 MG 24 hr tablet TAKE 2 TABLETS BY MOUTH IN THE MORNING AND 2 TABLETS IN THE EVENING WITH MEALS   midodrine  (PROAMATINE ) 10 MG tablet 5 mg   mirtazapine  (REMERON ) 15 MG tablet 15 mg, Nightly   naltrexone  (DEPADE) 50 mg tablet 50 mg, Oral, Nightly   pantoprazole  (PROTONIX ) 40 MG tablet 80 mg, Oral, Daily before breakfast   pen needle, diabetic (ULTICARE PEN NEEDLE) 32 gauge x 1/4 (6 mm) Ndle 1 each, Subcutaneous, Nightly   rifAXIMin  (XIFAXAN ) 550 mg Tab 550 mg, Oral, 2 times a day   scopolamine  (TRANSDERM-SCOP) 1 mg over 3 days 1 patch, Transdermal, Every 72 hours   secukinumab  (COSENTYX  PEN, 2 PENS,) 150 mg/mL PnIj injection Inject the contents of 2 pens (300 mg total) under the skin every twenty-eight (28) days. Maintenance dose   simvastatin  (ZOCOR ) 20 MG tablet 20 mg, Oral, Every evening   spironolactone  (ALDACTONE ) 100 MG tablet 100 mg, Oral, Daily (standard)   zolpidem  (AMBIEN ) 5 MG tablet 5 mg, Nightly PRN        ALLERGIES:  Patient has no known allergies.    MEDICAL HISTORY:  Past Medical History[2]  Past Surgical History[3]  SOCIAL HISTORY  Social History     Social History Narrative    Widowed    No children    Not currently working:  Previously worked as a Engineer, site      reports that she has never smoked. She has been exposed to tobacco smoke. She has never used smokeless tobacco. She reports that she does not currently use alcohol. She reports that she does not currently use drugs after having used the following drugs: Marijuana, Codeine, Hydrocodone , Morphine , and Oxycodone .   FAMILY HISTORY  Family History[4]     Physical Exam:  Vitals:    05/21/24 0847 05/21/24 0900 05/21/24 1100 05/21/24 1227   BP: 128/56 137/57 122/65    Pulse: 95 98 98 101   Resp: 22 19 20 22    Temp:       TempSrc:       SpO2: 95% 90% 97% 96%   Weight:       Height:         I/O this shift:  In: 301.6 [I.V.:37.7; IV Piggyback:263.8]  Out: 320 [Urine:320]    Intake/Output Summary (Last 24 hours) at 05/21/2024 1302  Last data filed at 05/21/2024 1245  Gross per 24 hour   Intake 704.06 ml   Output 890 ml   Net -185.94 ml     Constitutional: sedated  Heart: RRR  Lungs: coarse breath sounds on the R-lung fields; intubated  Abd: soft  Ext: +3-4 edema          [1]   Current Facility-Administered Medications:     acetaminophen  (TYLENOL ) tablet 650 mg, Oral, Q8H PRN    [Provider Hold] albumin  human 25 % 75 g, Intravenous, Q12H SCH    clobetasol  (TEMOVATE ) 0.05 % ointment, Topical, BID    dextrose  50 % in water  (D50W) 50 % solution 12.5 g, Intravenous, Q15 Min PRN    [START ON 05/27/2024] ergocalciferol-1,250 mcg (50,000 unit) (DRISDOL) capsule 1,250 mcg, Oral, Weekly    famotidine  (PEPCID ) tablet 20 mg, Enteral tube: gastric, BID    fentaNYL  (PF) (SUBLIMAZE ) injection 25 mcg, Intravenous, Q30 Min PRN **OR** fentaNYL  (PF) (SUBLIMAZE )  injection 50 mcg, Intravenous, Q30 Min PRN    fentaNYL  PF (SUBLIMAZE ) (50 mcg/mL) infusion (bag), Intravenous, Continuous    furosemide  (LASIX ) 160 mg in sodium chloride  (NS) 0.9 % 50 mL IVPB, Intravenous, Q6H    glucagon injection 1 mg, Intramuscular, Once PRN    glucose chewable tablet 16 g, Oral, Q10 Min PRN    heparin  (porcine) 5,000 unit/mL injection 5,000 Units, Subcutaneous, Q8H SCH    hydrOXYzine  (ATARAX ) tablet 25 mg, Oral, Q6H PRN    insulin  lispro (HumaLOG ) inj PERCENTAGE MEAL EATEN 10 Units, Subcutaneous, 3xd Meals    insulin  lispro (HumaLOG ) injection CORRECTIONAL 0-20 Units, Subcutaneous, ACHS    insulin  NPH (HumuLIN ,NovoLIN ) injection 30 Units, Subcutaneous, Q12H [facility]    lactulose  oral solution, Oral, BID    levETIRAcetam  (KEPPRA ) tablet 750 mg, Oral, BID    melatonin tablet 3 mg, Oral, Nightly PRN    melatonin tablet 3 mg, Oral, QPM    midodrine  (PROAMATINE ) tablet 20 mg, Oral, TID    multivitamins, therapeutic with minerals tablet 1 tablet, Oral, Daily    NORepinephrine  8 mg in dextrose  5 % 250 mL (32 mcg/mL) infusion PMB, Intravenous, Continuous    NORepinephrine  bitartrate-D5W 8 mg/250 mL (32 mcg/mL) infusion, ,     ondansetron  (ZOFRAN ) injection 4 mg, Intravenous, Q6H PRN    pantoprazole  (Protonix ) EC tablet 40 mg, Oral, Daily    piperacillin -tazobactam (ZOSYN ) IVPB (premix) 4.5 g, Intravenous, Q8H    [COMPLETED] potassium chloride  10 mEq in 100 mL IVPB, Intravenous, Once **FOLLOWED BY** [COMPLETED] potassium chloride  10 mEq in 100 mL IVPB, Intravenous, Once **FOLLOWED BY** potassium chloride  10 mEq in 100 mL IVPB, Intravenous, Once **FOLLOWED BY** potassium chloride  10 mEq in 100 mL IVPB, Intravenous, Once    propofol  (DIPRIVAN ) infusion 10 mg/mL, Intravenous, Continuous    sodium chloride  (NS) 0.9 % flush 10 mL, Intravenous, Q8H    sodium chloride  (NS) 0.9 % flush 10 mL, Intravenous, Q8H    sodium chloride  (NS) 0.9 % flush 10 mL, Intravenous, Q8H    ursodiol  (ACTIGALL ) capsule 300 mg, Oral, BID    vasopressin  20 units in 100 mL (0.2 units/mL) infusion premade vial, Intravenous, Continuous    zinc sulfate (ZINCATE) capsule 220 mg, Oral, Daily    Facility-Administered Medications Ordered in Other Encounters:     phenylephrine  1 mg/10 mL (100 mcg/mL) injection Syrg, Intravenous, PRN one-step-med    phenylephrine  1 mg/10 mL (100 mcg/mL) injection Syrg, Intravenous, PRN one-step-med    Propofol  (DIPRIVAN ) injection, Intravenous, PRN one-step-med    Propofol  (DIPRIVAN ) injection, Intravenous, PRN one-step-med    ROCuronium  (ZEMURON ) injection, Intravenous, PRN one-step-med    ROCuronium  (ZEMURON ) injection, Intravenous, PRN one-step-med  [2]   Past Medical History:  Diagnosis Date    Anemia     Angular blepharoconjunctivitis of both eyes 12/21/2020    Anxiety     Arthritis     Ascites     Blepharitis     Calcified cerebral meningioma    08/16/2022    Cataract associated with type 2 diabetes mellitus    12/21/2020    Chronic headaches     Chronic pain     Chronic pain disorder 2029    I have multiple issues. Seeing doctor's regarding  issues    Cirrhosis        Coronary artery disease involving native heart without angina pectoris 02/05/2021    Current moderate episode of major depressive disorder (CMS-HCC) 11/06/2022    Diabetes mellitus  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 type 2 diabetes mellitus    12/02/2022    Dry eye syndrome, bilateral 12/21/2020    Dysphagia 02/02/2023    Endometrial cancer    2007    Esophageal varices in cirrhosis    06/17/2022    Fractures 01/29/2018    Gastroparesis     Generalized pruritus 02/02/2023    GERD (gastroesophageal reflux disease) It's  been going on for years    I take medication  which helps tremendously    Glaucoma 2023    I see a doctor on a regular basis    H/O diabetic gastroparesis 01/09/2021    Heart disease     Hemorrhage of rectum and anus 03/17/2011    Hepatic cirrhosis    06/17/2022    Hepatic encephalopathy    06/17/2022    Hepatic steatosis 03/13/2021    Noted on CT Fall 2021    High cholesterol     History of encephalopathy     Hx of psoriasis 08/06/2022    Hyperlipidemia 12/12/2015    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    Liver disease     Major depression     Metabolic dysfunction-associated steatohepatitis (MASH)     Morbid obesity (CMS-HCC) 11/10/2019    Myopia of both eyes with astigmatism and presbyopia 12/02/2022    Nausea 09/22/2022    Non-alcoholic fatty liver disease     NPDR (nonproliferative diabetic retinopathy)        Right eye    Other ascites 09/22/2022    Other chronic pain 11/06/2022    Other insomnia 11/06/2022    PFD (pelvic floor dysfunction) 04/07/2013    Portal hypertension    10/08/2022    Psoriasis     Ptosis of eyelid, left     Left upper eyelid    Reflux     Retinopathy of left eye, background, proliferative 01/09/2021    Right upper quadrant abdominal pain 09/22/2022    Secondary esophageal varices without bleeding    06/17/2022    Type 2 diabetes mellitus, with long-term current use of insulin     12/12/2015    Last Assessment & Plan:   The current medical regimen is effective;  continue present plan and medications.  Patient will continue good management of diabetes especially with weight loss. Hopefully with further weight loss will run into the problem of being overmedicated and having to cut back on medications     Some confusion on diabetes medications was have not.been refilled for over a year.  Pa   [3]   Past Surgical History:  Procedure Laterality Date    CHOLECYSTECTOMY      HYSTERECTOMY      endometrial cancer    HYSTERECTOMY      IR TIPS  05/04/2024    IR TIPS 05/04/2024 Janit Massie [facility]    OOPHORECTOMY      OTHER SURGICAL HISTORY  Hysterectomy was in 2007. Gallbladder was in 1999 and phrenectomy  was in 2024    Hysterectomy,  gallbladder removed and phrenectomy    PR ANAL PRESSURE RECORD Left 03/31/2013    Procedure: ANORECTAL MANOMETRY;  Surgeon: None None;  [facility];  Service: Gastroenterology    PR BREATH HYDROGEN TEST N/A 03/31/2013    Procedure: BREATH HYDROGEN TEST;  Surgeon: None None;  [facility]: [facility];  Service: Gastroenterology  PR COLSC FLX W/RMVL OF TUMOR POLYP LESION SNARE TQ N/A 09/06/2020    Procedure: COLONOSCOPY FLEX; W/REMOV TUMOR/LES BY SNARE;  Surgeon: Olam Earnie Henle, MD;  [facility]: [facility];  Service: Gastroenterology    PR UPPER GI ENDOSCOPY,BIOPSY N/A 04/15/2013    Procedure: UGI ENDOSCOPY; WITH BIOPSY, SINGLE OR MULTIPLE;  Surgeon: Sim GORMAN Magnuson, MD;  [facility]: [facility];  Service: Gastroenterology    PR UPPER GI ENDOSCOPY,BIOPSY N/A 09/06/2020    Procedure: UGI ENDOSCOPY; WITH BIOPSY, SINGLE OR MULTIPLE;  Surgeon: Olam Earnie Henle, MD;  Location: [facility];  Service: Gastroenterology    PR UPPER GI ENDOSCOPY,BIOPSY N/A 05/05/2022    Procedure: UGI ENDOSCOPY; WITH BIOPSY, SINGLE OR MULTIPLE;  Surgeon: Olam Earnie Henle, MD;  [facility];  Service: Gastroenterology    PR UPPER GI ENDOSCOPY,BIOPSY N/A 01/22/2023    Procedure: UGI ENDOSCOPY; WITH BIOPSY, SINGLE OR MULTIPLE;  Surgeon: Minnie Krystal Claude, MD;  [facility];  Service: Gastroenterology   [4]   Family History  Problem Relation Age of Onset    Diabetes Mother     Cancer Mother         Complications during dialysis    Depression Mother     Hypertension Mother     Vision loss Mother     No Known Problems Father     No Known Problems Brother     Diabetes Maternal Grandmother     Hypertension Maternal Grandmother     Alcohol abuse Maternal Grandmother         Heavy drinker    Arthritis Maternal Grandmother     Cancer Maternal Grandfather     Diabetes Maternal Grandfather     Alcohol abuse Maternal Grandfather     Heart disease Maternal Grandfather     No Known Problems Paternal Grandmother     Skin cancer Paternal Grandfather     Stroke Paternal Grandfather     Vision loss Maternal Aunt         Heart Attack    No Known Problems Maternal Uncle     No Known Problems Paternal Aunt     No Known Problems Paternal Uncle     No Known Problems Other     Melanoma Neg Hx     Basal cell carcinoma Neg Hx     Squamous cell carcinoma Neg Hx     Substance Abuse Disorder Neg Hx     Drug abuse Neg Hx     Mental illness Neg Hx     Amblyopia Neg Hx     Blindness Neg Hx     Cataracts Neg Hx     Glaucoma Neg Hx     Macular degeneration Neg Hx     Retinal detachment Neg Hx     Strabismus Neg Hx     Thyroid disease Neg Hx     Breast cancer Neg Hx

## 2024-05-21 NOTE — Unmapped (Signed)
 Nephrology Treatment Plan    Ms. Kalina is a 57yoF with a hx of CAD s/p NSTEMI, NASH cirrhosis c/b ascites, HE, EV with recent banding who initially presented to OSH with upper GI bleed from esophageal ulcers with failed attempt at TIPS c/b VT arrest requiring defibrillation, tx to [facility] on 6/4 and ultimately underwent successful TIPS on 6/4. Course has been complicated by AHRF (extubated 6/12), AKI, stress cardiomyopathy, and hepatic encephalopathy as well as ongoing vasoplegia    She was treated with 125g of 25% albumin  on 6/20 but unfortunately has had diminished UOP since pressors held ~6/17-6/18 (Previously >1 L, now ~400cc range)    This evening, patient rapidly developed hypoxemia and increased wob with new evidence of large pleural effusion completely opacifying L lung and shifting mediastinum. She was started on BiPAP and ultimately underwent L thoracentesis for 1500cc with improvement in symptoms, hypoxemia, however has subsequently worsened to 65% Bipap      #AKI  #AHRF; Pleural effusion  B/l Cr of 0.6-0.8 up to 1.36 as of 6/20 and remains there. Sodium has also been declining since 6/20 with Urine Na <10. I am concerned that she has developed hypoperfusion injury to her kidneys in the setting of SBP > 85 goal (though this was delayed and she seems to have been tolerating it well). Suspect her volume overload is more likely due to third spacing of fluids given in the form of albumin  rather than renal failure. Can facilitate diuresis with increased renal perfusion, with consideration of CRRT for volume removal as needed, but I am also concerned that if volume is accumulating rapidly over the course of a couple of hours, she may need a chest tube for effusion management despite chronicity of L effusion.   - agree with high dose lasix  160mg  IV q 6 hours  - recommend increasing MAP goal to 65 for improved renal perfusion to facilitate diuresis  - recommend repeat post thoracentesis CXR  - Hold albumin   - we will examine urine sediment  - no urgent dialysis indications currently but will continue to monitor volume status and response to diuretics.    #Hyponatremia  Downtrending Na since 6/20 with low urine Na suggesting apparent poor perfusion leading to enhanced fluid retention  - hold albumin  as above  - recommend fluid restriction of 1L (including IV fluids/albumin )      Formal consult to follow.    Corean GORMAN Lim, MD

## 2024-05-21 NOTE — Unmapped (Signed)
 CVAD Liaison - Insertion Note      The CVAD Liaison was contacted for the insertion of Central Venous Access Device (CVAD).  A chart review performed.   Indication: Medications requiring central line    Prior to the start of the procedure, a time out was performed and the identity of the patient was confirmed via name, medical record number and date of birth.  The Central Line Checklist was referenced.  The sterile field was prepared with necessary supplies and equipment verified.  Insertion site was prepped with chlorhexidine  and allowed to dry.  Maximum sterile techniques was utilized.    CVAD was inserted by Dewayne MD.  Catheter was aspirated and flushed.  After line was placed and secured by provider, the insertion site cleansed and sterile dressing applied per manufacturer guidelines by CVAD Liaison.     CVAD Liaison was present during entire procedure.  Report of the procedure given to the Primary Nurse.      Thank you for this consult,  Geni DELENA Matar, RN, CVAD Liaison     Consult Time 120 minutes

## 2024-05-21 NOTE — Unmapped (Signed)
 Thoracentesis Procedure Note       PRE-PROCEDURE    Date of Service: 05/21/2024    Patient Name:: Ashley Briggs  Patient MRN: [id number]    Requesting Service: Medical ICU (MDI)    Indications:  pleural effusion on the left    Known Bleeding Diathesis: Patient/caregiver denies any known bleeding or platelet disorder.     Antiplatelet Agents: This patient is not on an antiplatelet agent.    Systemic Anticoagulation: This patient is not on full systemic anticoagulation.    Significant Labs:  INR   Date Value Ref Range Status   05/20/2024 1.43  Final   01/31/2011 1.1  Final     PT   Date Value Ref Range Status   05/20/2024 16.3 (H) 9.9 - 12.6 sec Final   01/31/2011 12.2 9.7 - 12.6 SECONDS Final     APTT   Date Value Ref Range Status   05/04/2024 39.8 (H) 24.8 - 38.4 sec Final   01/31/2011 30.7 24.1 - 32.5 SECONDS Final     Platelet   Date Value Ref Range Status   05/20/2024 145 (L) 150 - 450 10*9/L Final   01/31/2011 256 150 - 440 x10 9th/L Final       Consent:   I explained the potential benefits and risks of the procedure, including  pneumothorax, cardiac complications, hematoma formation, local infection, nerve/tissue damage and sepsis. I explained potential alternatives. The patient/HCDM understands these risks, agrees to the procedure, and signed the informed consent form.    I explained benefits (therapeutic) of the proceder    PERI-PROCEDURE    Time-out was performed immediately prior to the procedure to verify correct patient, procedure, site, positioning, and special equipment if applicable.    The patient was placed in with [facility] at 45 degrees w patient supine position. Using ultrasound guidance a Large,hypoechoic, simple pleural effusion on the left was noted. The area was prepped with chlorhexadine and draped in the usual sterile fashion.  Following this 3 mL of 1% lidocaine  as injected subcutaneously to provide topical anesthesia then deep to the pleura.  A small incision was then made parallel and superior to the rib, the Arrow-Clarke thoracentesis needle with catheter was inserted into the chest wall and advanced under constant aspiration.  Upon aspiration of pleural fluid, the 20F catheter was advanced into the pleural space and the needle was removed.  The catheter was then connected to a drainange bag and the fluid was removed under manual drainage.  The procedure was completed after 1.5L drained, still has a pleural effusion but out of concern for re-expansion pulmonary edema, stopped at 1.5L. The catheter was then removed during slow forced exhalation and bandaged.    POST-PROCEDURE    Findings:   1.5 mL of serosanguinous (mostly serous) pleural fluid was removed. The procedure was terminated due to goal of 1.5L reached. The fluid was sent for glucose, protein, LDH, Cell count and differential, or gram stain and culture.    Estimated Blood Loss: minimal    Condition:  The patient tolerated the procedure well, sats increased during from 89% to 100% during the procedure. Work of breathing improved.     Complications: none   Confirmed no pneumo with bedside US  following procedure. Still has small to moderate L sided pleural effusion.    Plan:  Wean BiPAP and trial lasix  for pulm edema.       Heinz JAYSON Canny, MD

## 2024-05-22 LAB — CBC W/ AUTO DIFF
BASOPHILS ABSOLUTE COUNT: 0.1 10*9/L (ref 0.0–0.1)
BASOPHILS RELATIVE PERCENT: 0.2 %
EOSINOPHILS ABSOLUTE COUNT: 0 10*9/L (ref 0.0–0.5)
EOSINOPHILS RELATIVE PERCENT: 0 %
HEMATOCRIT: 28.5 % — ABNORMAL LOW (ref 34.0–44.0)
HEMOGLOBIN: 9.1 g/dL — ABNORMAL LOW (ref 11.3–14.9)
LYMPHOCYTES ABSOLUTE COUNT: 0.3 10*9/L — ABNORMAL LOW (ref 1.1–3.6)
LYMPHOCYTES RELATIVE PERCENT: 0.8 %
MEAN CORPUSCULAR HEMOGLOBIN CONC: 31.9 g/dL — ABNORMAL LOW (ref 32.0–36.0)
MEAN CORPUSCULAR HEMOGLOBIN: 29.1 pg (ref 25.9–32.4)
MEAN CORPUSCULAR VOLUME: 91.3 fL (ref 77.6–95.7)
MONOCYTES ABSOLUTE COUNT: 2.7 10*9/L — ABNORMAL HIGH (ref 0.3–0.8)
MONOCYTES RELATIVE PERCENT: 6.8 %
NEUTROPHILS ABSOLUTE COUNT: 36.8 10*9/L — ABNORMAL HIGH (ref 1.8–7.8)
NEUTROPHILS RELATIVE PERCENT: 92.2 %
PLATELET COUNT: >206 10*9/L (ref 150–450)
RED BLOOD CELL COUNT: 3.13 10*12/L — ABNORMAL LOW (ref 3.95–5.13)
RED CELL DISTRIBUTION WIDTH: 21.3 % — ABNORMAL HIGH (ref 12.2–15.2)
WBC ADJUSTED: 40 10*9/L — ABNORMAL HIGH (ref 3.6–11.2)

## 2024-05-22 LAB — BLOOD GAS, ARTERIAL
BASE EXCESS ARTERIAL: -16.9 — ABNORMAL LOW (ref -2.0–2.0)
BASE EXCESS ARTERIAL: -18.3 — ABNORMAL LOW (ref -2.0–2.0)
BASE EXCESS ARTERIAL: -19.5 — ABNORMAL LOW (ref -2.0–2.0)
BASE EXCESS ARTERIAL: -20.5 — ABNORMAL LOW (ref -2.0–2.0)
HCO3 ARTERIAL: 10 mmol/L — ABNORMAL LOW (ref 22–27)
HCO3 ARTERIAL: 9 mmol/L — ABNORMAL LOW (ref 22–27)
HCO3 ARTERIAL: 8 mmol/L — ABNORMAL LOW (ref 22–27)
HCO3 ARTERIAL: 7 mmol/L — ABNORMAL LOW (ref 22–27)
O2 SATURATION ARTERIAL: 99.4 % (ref 94.0–100.0)
O2 SATURATION ARTERIAL: 99.3 % (ref 94.0–100.0)
O2 SATURATION ARTERIAL: 96.6 % (ref 94.0–100.0)
O2 SATURATION ARTERIAL: 98.4 % (ref 94.0–100.0)
PCO2 ARTERIAL: 26.5 mmHg — ABNORMAL LOW (ref 35.0–45.0)
PCO2 ARTERIAL: 26.2 mmHg — ABNORMAL LOW (ref 35.0–45.0)
PCO2 ARTERIAL: 26.6 mmHg — ABNORMAL LOW (ref 35.0–45.0)
PCO2 ARTERIAL: 20.5 mmHg — ABNORMAL LOW (ref 35.0–45.0)
PH ARTERIAL: 7.19 — CL (ref 7.35–7.45)
PH ARTERIAL: 7.15 — CL (ref 7.35–7.45)
PH ARTERIAL: 7.11 — CL (ref 7.35–7.45)
PH ARTERIAL: 7.13 — CL (ref 7.35–7.45)
PO2 ARTERIAL: 140 mmHg — ABNORMAL HIGH (ref 80.0–110.0)
PO2 ARTERIAL: 148 mmHg — ABNORMAL HIGH (ref 80.0–110.0)
PO2 ARTERIAL: 94.2 mmHg (ref 80.0–110.0)
PO2 ARTERIAL: 110 mmHg (ref 80.0–110.0)

## 2024-05-22 LAB — COMPREHENSIVE METABOLIC PANEL
ALBUMIN: 2.5 g/dL — ABNORMAL LOW (ref 3.4–5.0)
ALBUMIN: 2.5 g/dL — ABNORMAL LOW (ref 3.4–5.0)
ALBUMIN: 2.4 g/dL — ABNORMAL LOW (ref 3.4–5.0)
ALKALINE PHOSPHATASE: 95 U/L (ref 46–116)
ALKALINE PHOSPHATASE: 101 U/L (ref 46–116)
ALKALINE PHOSPHATASE: 85 U/L (ref 46–116)
ALT (SGPT): 24 U/L (ref 10–49)
ALT (SGPT): 26 U/L (ref 10–49)
ALT (SGPT): 28 U/L (ref 10–49)
ANION GAP: 18 mmol/L — ABNORMAL HIGH (ref 5–14)
AST (SGOT): 39 U/L — ABNORMAL HIGH (ref <=34)
AST (SGOT): 46 U/L — ABNORMAL HIGH (ref <=34)
BILIRUBIN TOTAL: 21.5 mg/dL — ABNORMAL HIGH (ref 0.3–1.2)
BILIRUBIN TOTAL: 20.3 mg/dL — ABNORMAL HIGH (ref 0.3–1.2)
BILIRUBIN TOTAL: 18.4 mg/dL — ABNORMAL HIGH (ref 0.3–1.2)
BLOOD UREA NITROGEN: 32 mg/dL — ABNORMAL HIGH (ref 9–23)
BUN / CREAT RATIO: 23
CALCIUM: 8.4 mg/dL — ABNORMAL LOW (ref 8.7–10.4)
CALCIUM: 8.9 mg/dL (ref 8.7–10.4)
CALCIUM: 8.4 mg/dL — ABNORMAL LOW (ref 8.7–10.4)
CHLORIDE: 102 mmol/L (ref 98–107)
CHLORIDE: 99 mmol/L (ref 98–107)
CHLORIDE: 100 mmol/L (ref 98–107)
CO2: 13 mmol/L — ABNORMAL LOW (ref 20.0–31.0)
CO2: <10 mmol/L — CL (ref 20.0–31.0)
CO2: <10 mmol/L — CL (ref 20.0–31.0)
CREATININE: 1.57 mg/dL — ABNORMAL HIGH (ref 0.55–1.02)
CREATININE: 1.46 mg/dL — ABNORMAL HIGH (ref 0.55–1.02)
CREATININE: 1.38 mg/dL — ABNORMAL HIGH (ref 0.55–1.02)
EGFR CKD-EPI (2021) FEMALE: 38 mL/min/{1.73_m2} — ABNORMAL LOW (ref >=60)
EGFR CKD-EPI (2021) FEMALE: 41 mL/min/{1.73_m2} — ABNORMAL LOW (ref >=60)
EGFR CKD-EPI (2021) FEMALE: 44 mL/min/{1.73_m2} — ABNORMAL LOW (ref >=60)
GLUCOSE RANDOM: 205 mg/dL — ABNORMAL HIGH (ref 70–179)
GLUCOSE RANDOM: 234 mg/dL — ABNORMAL HIGH (ref 70–99)
GLUCOSE RANDOM: 209 mg/dL — ABNORMAL HIGH (ref 70–179)
POTASSIUM: 4.2 mmol/L (ref 3.4–4.8)
POTASSIUM: 4.2 mmol/L (ref 3.4–4.8)
PROTEIN TOTAL: 5.6 g/dL — ABNORMAL LOW (ref 5.7–8.2)
SODIUM: 133 mmol/L — ABNORMAL LOW (ref 135–145)
SODIUM: 130 mmol/L — ABNORMAL LOW (ref 135–145)
SODIUM: 131 mmol/L — ABNORMAL LOW (ref 135–145)

## 2024-05-22 LAB — TSH: THYROID STIMULATING HORMONE: 1.498 u[IU]/mL (ref 0.550–4.780)

## 2024-05-22 LAB — PHOSPHORUS: PHOSPHORUS: 5 mg/dL (ref 2.4–5.1)

## 2024-05-22 LAB — MAGNESIUM: MAGNESIUM: 1.9 mg/dL (ref 1.6–2.6)

## 2024-05-22 LAB — LACTATE, VENOUS, WHOLE BLOOD: LACTATE BLOOD VENOUS: 8.2 mmol/L (ref 0.5–1.8)

## 2024-05-22 LAB — FIBRINOGEN: FIBRINOGEN LEVEL: 166 mg/dL — ABNORMAL LOW (ref 175–500)

## 2024-05-22 LAB — PROTIME-INR
INR: 1.86
PROTIME: 21.2 s — ABNORMAL HIGH (ref 9.9–12.6)

## 2024-05-22 LAB — CRYPTOCOCCAL ANTIGEN, SERUM: CRYPTOCOCCAL ANTIGEN: NEGATIVE

## 2024-05-22 LAB — O2 SATURATION VENOUS: O2 SATURATION VENOUS: 92.4 % — ABNORMAL HIGH (ref 40.0–85.0)

## 2024-05-22 LAB — SLIDE REVIEW

## 2024-05-22 MED ADMIN — pantoprazole (Protonix) EC tablet 40 mg: 40 mg | ORAL | @ 12:00:00 | Stop: 2024-05-22

## 2024-05-22 MED ADMIN — NORepinephrine 8 mg in dextrose 5 % 250 mL (32 mcg/mL) infusion PMB: 0-30 ug/min | INTRAVENOUS | @ 07:00:00 | Stop: 2024-05-22

## 2024-05-22 MED ADMIN — vasopressin 20 units in 100 mL (0.2 units/mL) infusion premade vial: .03 [IU]/min | INTRAVENOUS | @ 05:00:00 | Stop: 2024-05-22

## 2024-05-22 MED ADMIN — ursodiol (ACTIGALL) capsule 300 mg: 300 mg | ORAL | @ 12:00:00 | Stop: 2024-05-22

## 2024-05-22 MED ADMIN — EPINEPHrine 8 mg in dextrose 5% 250 mL (32 mcg/mL) infusion PMB: 0-20 ug/min | INTRAVENOUS | @ 01:00:00

## 2024-05-22 MED ADMIN — NxStage/multiBic RFP 401 (+/- BB) 5000 mL - contains 4 mEq/L of potassium dialysis solution 5,000 mL: 5000 mL | INTRAVENOUS_CENTRAL | @ 03:00:00

## 2024-05-22 MED ADMIN — heparin (porcine) 5,000 unit/mL injection 5,000 Units: 5000 [IU] | SUBCUTANEOUS | @ 01:00:00

## 2024-05-22 MED ADMIN — hydrocortisone sod succ (Solu-CORTEF) injection 50 mg: 50 mg | INTRAVENOUS

## 2024-05-22 MED ADMIN — lactulose oral solution: 20 g | ORAL

## 2024-05-22 MED ADMIN — clobetasol (TEMOVATE) 0.05 % ointment: TOPICAL

## 2024-05-22 MED ADMIN — insulin NPH (HumuLIN,NovoLIN) injection 30 Units: 30 [IU] | SUBCUTANEOUS

## 2024-05-22 MED ADMIN — hydrocortisone sod succ (Solu-CORTEF) injection 50 mg: 50 mg | INTRAVENOUS | @ 05:00:00 | Stop: 2024-05-22

## 2024-05-22 MED ADMIN — sodium chloride (NS) 0.9 % flush 10 mL: 10 mL | INTRAVENOUS

## 2024-05-22 MED ADMIN — morphine injection 1 mg: 1 mg | INTRAVENOUS | @ 15:00:00 | Stop: 2024-05-22

## 2024-05-22 MED ADMIN — insulin NPH (HumuLIN,NovoLIN) injection 30 Units: 30 [IU] | SUBCUTANEOUS | @ 12:00:00 | Stop: 2024-05-22

## 2024-05-22 MED ADMIN — EPINEPHrine HCL in 5% dextrose 8 mg/250 mL (32 mcg/mL) infusion: INTRAVENOUS | @ 01:00:00 | Stop: 2024-05-21

## 2024-05-22 MED ADMIN — piperacillin-tazobactam (ZOSYN) IVPB (premix) 4.5 g: 4.5 g | INTRAVENOUS | @ 06:00:00 | Stop: 2024-05-22

## 2024-05-22 MED ADMIN — famotidine (PEPCID) tablet 20 mg: 20 mg | GASTROENTERAL

## 2024-05-22 MED ADMIN — insulin lispro (HumaLOG) inj PERCENTAGE MEAL EATEN 10 Units: 10 [IU] | SUBCUTANEOUS | @ 12:00:00 | Stop: 2024-05-22

## 2024-05-22 MED ADMIN — heparin (porcine) 1000 unit/mL injection 1,100 Units: 1.1 mL | INTRAVENOUS | @ 01:00:00 | Stop: 2024-05-21

## 2024-05-22 MED ADMIN — multivitamins, therapeutic with minerals tablet 1 tablet: 1 | ORAL | @ 12:00:00 | Stop: 2024-05-22

## 2024-05-22 MED ADMIN — heparin (porcine) 1000 unit/mL injection 1,400 Units: 1.4 mL | INTRAVENOUS | @ 01:00:00 | Stop: 2024-05-21

## 2024-05-22 MED ADMIN — vancomycin (VANCOCIN) 1750 mg in sodium chloride (NS) 0.9% 500 mL IVPB: 1750 mg | INTRAVENOUS | @ 01:00:00 | Stop: 2024-05-21

## 2024-05-22 MED ADMIN — midazolam (VERSED) injection 1 mg: 1 mg | INTRAVENOUS | @ 13:00:00 | Stop: 2024-05-22

## 2024-05-22 MED ADMIN — ursodiol (ACTIGALL) capsule 300 mg: 300 mg | ORAL

## 2024-05-22 MED ADMIN — sodium chloride (NS) 0.9 % flush 10 mL: 10 mL | INTRAVENOUS | @ 07:00:00 | Stop: 2024-05-22

## 2024-05-22 MED ADMIN — phenylephrine 100 mg in sodium chloride 0.9 % 250 mL (0.4 mg/mL) infusion: 0-300 ug/min | INTRAVENOUS | @ 10:00:00 | Stop: 2024-05-22

## 2024-05-22 MED ADMIN — piperacillin-tazobactam (ZOSYN) IVPB (premix) 4.5 g: 4.5 g | INTRAVENOUS | @ 13:00:00 | Stop: 2024-05-22

## 2024-05-22 MED ADMIN — HYDROmorphone (PF) (DILAUDID) injection 1 mg: 1 mg | INTRAVENOUS | @ 13:00:00 | Stop: 2024-05-22

## 2024-05-22 MED ADMIN — phenylephrine 100 mg in sodium chloride 0.9 % 250 mL (0.4 mg/mL) infusion: 0-300 ug/min | INTRAVENOUS | @ 04:00:00

## 2024-05-22 MED ADMIN — methylene blue 89 mg in dextrose 5 % 82.2 mL IVPB: 1 mg/kg | INTRAVENOUS | @ 04:00:00 | Stop: 2024-05-22

## 2024-05-22 MED ADMIN — clobetasol (TEMOVATE) 0.05 % ointment: TOPICAL | @ 12:00:00 | Stop: 2024-05-22

## 2024-05-22 MED ADMIN — hydrocortisone sod succ (Solu-CORTEF) injection 50 mg: 50 mg | INTRAVENOUS | @ 12:00:00 | Stop: 2024-05-22

## 2024-05-22 MED ADMIN — sodium bicarbonate injection 50 mEq: 50 meq | INTRAVENOUS | @ 11:00:00 | Stop: 2024-05-22

## 2024-05-22 MED ADMIN — insulin lispro (HumaLOG) injection CORRECTIONAL 0-20 Units: 0-20 [IU] | SUBCUTANEOUS | @ 12:00:00 | Stop: 2024-05-22

## 2024-05-22 MED ADMIN — insulin lispro (HumaLOG) injection CORRECTIONAL 0-20 Units: 0-20 [IU] | SUBCUTANEOUS

## 2024-05-22 MED ADMIN — NORepinephrine 8 mg in dextrose 5 % 250 mL (32 mcg/mL) infusion PMB: 0-30 ug/min | INTRAVENOUS | @ 12:00:00 | Stop: 2024-05-22

## 2024-05-22 MED ADMIN — heparin (porcine) 5,000 unit/mL injection 5,000 Units: 5000 [IU] | SUBCUTANEOUS | @ 10:00:00 | Stop: 2024-05-22

## 2024-05-22 MED ADMIN — EPINEPHrine 8 mg in dextrose 5% 250 mL (32 mcg/mL) infusion PMB: 0-20 ug/min | INTRAVENOUS | @ 08:00:00 | Stop: 2024-05-22

## 2024-05-22 MED ADMIN — levETIRAcetam (KEPPRA) tablet 750 mg: 750 mg | ORAL

## 2024-05-22 MED ADMIN — lactulose oral solution: 20 g | ORAL | @ 12:00:00 | Stop: 2024-05-22

## 2024-05-22 MED ADMIN — HYDROmorphone (PF) (DILAUDID) injection 1 mg: 1 mg | INTRAVENOUS | @ 05:00:00 | Stop: 2024-05-22

## 2024-05-22 MED ADMIN — famotidine (PEPCID) tablet 20 mg: 20 mg | GASTROENTERAL | @ 12:00:00 | Stop: 2024-05-22

## 2024-05-22 MED ADMIN — tobramycin (NEBCIN) 204.8 mg in sodium chloride (NS) 0.9 % 100 mL IVPB: 3 mg/kg | INTRAVENOUS | @ 05:00:00 | Stop: 2024-05-22

## 2024-05-22 MED ADMIN — zinc sulfate (ZINCATE) capsule 220 mg: 220 mg | ORAL | @ 12:00:00 | Stop: 2024-05-22

## 2024-05-22 MED ADMIN — levETIRAcetam (KEPPRA) tablet 750 mg: 750 mg | ORAL | @ 12:00:00 | Stop: 2024-05-22

## 2024-05-22 NOTE — Unmapped (Signed)
 Physician Discharge Summary [facility]  MICU [facility]  [address]  [place] [address]  Dept: [phone]  Loc: [phone]     Identifying Information:   Ashley Briggs  October 08, 1966  [id number]    Primary Care Physician: Keven Crumbly Pap, DO     Code Status: Comfort Measures - DNR Comfort Care    Admit Date: 05/04/2024    Discharge Date: 06/19/24     Discharge To: Deceased -  Eilee Enslie Sahota had a pronouncement of death 06-19-2024 and the time of death is 10:55 . The pronouncement of death was made by Aloha Rowels, MD.   The events prior to death were increased pressor requirements, worsening acidosis and renal failure.  The parties present at time of death was/were family. The patient's HCPOA was notified of the death. This case was not referred to the medical examiner. An autopsy was declined.    Discharge Service: [facility] - Medicine ICU Team B (MICU-B)     Discharge Attending Physician: No att. providers found    Discharge Diagnoses:   Principal Problem:    Shock    (POA: Unknown)  Active Problems:    Type 2 diabetes mellitus, with long-term current use of insulin     (POA: Not Applicable)    Hyperlipidemia (POA: Yes)    Hypertension (POA: Yes)    Coronary artery disease involving native heart without angina pectoris (POA: Yes)    Diabetes mellitus    (POA: Yes)    Esophageal varices in cirrhosis    (POA: Yes)    Hepatic encephalopathy    (POA: Yes)    Calcified cerebral meningioma    (POA: Yes)    Portal hypertension    (POA: Yes)    Other chronic pain (POA: Yes)    OSA (obstructive sleep apnea) (POA: Yes)    Stage 3a chronic kidney disease (CMS-HCC) (POA: Yes)    Hematemesis (POA: Unknown)  Resolved Problems:    * No resolved hospital problems. *      Hospital Course:   Cumi Celsey Asselin is a 58 y.o. female w/ PMH CAD c/b NSTEMI, CKD, NASH cirrhosis c/b ascites, hepatic encephalopathy, and esophageal varices s/p banding who presented to OSH with hematemesis 6 days s/p variceal banding. Underwent EGD at OSH which found lesions unamenable to banding and then underwent unsuccessful TIPS (c/f portal vein thrombus) c/b Vtach (with pulse) requiring defibrillation, transferred to [facility] MICU with 2 pressor shock for accelerated transplant workup. Prolonged hospital course complicated by septic and hypovolemic shock, acute hypoxic respiratory failure, acute renal failure, and infection.  Patient ultimately went comfort care due to worsening refractory shock and was ultimately pronounced dead on 19-Jun-2024.  Hospital course is outlined by problem below.     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 She initially presented to OSH with hematemesis 6 days s/p esophageal banding. Emergent TIPS was attempted at OSH but unsuccessful. She developed shock requiring 2 pressors and was transferred to [facility] MICU for urgent transplant evaluation. EGD on 6/4 demonstrated nonbleeding esophageal ulcers at sites of prior banding, but the stomach was completely occluded with blood. She acutely worsened on 6/4 requiring a fourth pressor, aggressive fluid resuscitation, and massive transfusion protocol. Bedside attempts to place a Blakemore tube were unsuccessful and she ultimately underwent salvage TIPS on 6/4. Course was complicated by hepatic  encephalopathy and ischemic hepatitis. Was ultimately weaned from pressors by 6/14 and stabilized from a hemorrhagic shock perspective. However unfortunately due to newly reduced ejection fraction on TTE she was not a transplant candidate. She then continued to decline and developed septic shock likely from aspiration pneumonia as further characterized below.    Aspiration pneumonia  Presented to [facility] MICU in 2-pressor shock with leukocytosis to 24.9, infectious source never identified.. With fever to 38.4 on 6/6. Paracentesis with PMNs < 250. Thoracentesis transudative. UA noninfectious. Lrcx gram stain with 1+ yeast, 1+ GPC. Fluctuating single pressor requirement since 6/9, repeat infectious workup negative. Had possible aspiration event overnight into 6/15 for which Zosyn  was resumed and completed a treatment course of 7 days.  As her clinical picture worsened and she had worsening respiratory failure, redosed her antibiotics though unfortunately continued to decline.  Suspect that her ongoing multi pressor shock was septic in nature from underlying pneumonia.    Intubation and mechanical ventilation  Arrived to [facility] MICU intubated for altered mental status. She was extubated 6/5, requiring reintubation later the same day for worsening hypoxia 2/2 pulmonary edema likely cardiogenic in the setting of stress cardiomyopathy. She was diuresed with improving respiratory status. Ultimately extubated on 6/11 and weaned to RA. Patient again intubated on 6/20 overnight due to increasing oxygen requirements refractory to positive pressure and thoracentesis.  POCUS with diffuse B-lines concerning for volume overload, CXR with worsening moderate to severe pulmonary edema versus infectious process.  She remained intubated until palliative extubation on 6/22.    Stress cardiomyopathy - Demand ischemia   Troponin on admission elevated to 5.4k attributed to demand ischemia. Echo on admission with EF normal at >55%. Developed wide complex tachycardia on 6/5, without evidence of acute ischemia on EKG. Troponin peaked at 23k (6/6). TTE was c/w stress cardiomyopathy, EF reduced to 45%. Repeat TTE on 6/11 with EF reduced to 40%.     Wide complex tachycardia - SVT with abberancy   Developed VT arrest requiring shock at OSH with initial TIPS attempt. On 6/5 developed several episodes of wide complex tachycardia. She was stabilized with amiodarone  bolus and completed amio load. Cardiology was consulted and favored WCT to represent SVT with abberancy 2/2 critical illness rather than VT. Received amnio load though no further antiarrhythmic.    Acute kidney injury - acute renal failure  Had course of Cr elevation to 1.6 from baseline ~0.8 around 6/7. Urine lytes most consistent with prerenal etiology at that time, 2/2 hypoperfusion iso shock, unlikely HRS. Cr normalized, adequate UOP, but worsened again 6/20 with concomitant rise in bilirubin. Concern is for pre-renal injury again given our lax BP goal of SBP >85.  Despite aggressive diuresis with Lasix  160 mg, patient had minimal urine output and so the decision was made to pursue CRRT for volume removal.      Insulin -dependent T2DM  A1c 5.6 on admission. Home regimen lantus  60, lispro with meals. BG control challenging in the setting of critical illness and stress dose steroids. S/p insulin  infusion, converted to NPH 6/8.  Continuing to titrate insulin  while off continuous tube feeds and p.o. only.  Will switch back to NPH postintubation and resuming tube feeds for better titration.  Insulin  discontinued and decision was made to pursue comfort measures.      Outpatient Provider Follow Up Issues:   NA    Touchbase with Outpatient Provider:  Warm Handoff: Not completed secondary to patient deceased    ______________________________________________________________________  Discharge Medications:   NA  Allergies:  Patient has no known allergies.  ______________________________________________________________________  Pending Test Results:  Pending Labs       Order Current Status    Cytology - Fluid Collected (05/21/24 0217)    Body fluid, pathologist review In process    Cryptococcal Antigen, Serum In process    HIV Antigen/Antibody Combo In process    Hepatitis B Surface Antibody In process    Hepatitis C Antibody In process    Blood Culture #1 Preliminary result    Blood Culture #2 Preliminary result    Fungal (Mould) Pathogen Culture Preliminary result    Patient Exposure Panel Preliminary result    Pleural Fluid Culture Preliminary result            Most Recent Labs:  NA    Relevant Studies/Radiology:  NA  ______________________________________________________________________  Discharge Instructions:   NA        Resources and Referrals       Patient Lift (DME)      Type: Standard Lift    Length of Need: lifetime    Height: 162.6 cm (5' 4.02)     Weight: 88.8 kg (195 lb 12.3 oz)        Height: 162.6 cm (5' 4.02)     Weight: 88.8 kg (195 lb 12.3 oz)             ______________________________________________________________________  Discharge Day Services:  BP 81/44  - Pulse (!) 0  - Temp 36.3 ??C (97.3 ??F) (Axillary)  - Resp (!) 0  - Ht 162.6 cm (5' 4.02)  - Wt 88.8 kg (195 lb 12.3 oz)  - LMP 08/03/2006  - SpO2 (!) 73%  - BMI 33.59 kg/m??       Condition at Discharge: deceased    Length of Discharge: I spent greater than 30 mins in the discharge of this patient.    Aloha Rowels, MD

## 2024-05-22 NOTE — Unmapped (Signed)
 Pt placed on comfort care. Pronounced dead at 1055a.

## 2024-05-22 NOTE — Unmapped (Signed)
 Pt received intubated with 7.5 ET tube, 23 @ lip with tube repositoned throughout the shift.  BS are rhonchi/wet and yellow secretions were suctioned above and below the cuff. Pt was on PRVC 420 22 +16 65%. Pt became acidotic with pH 7.1.  Increase rate to eventually 35 and vt was 8kg.  Pt is received dialysis.  Slowly titrated rr, peep and fio2.  Airway remains secure and patent.  No distress noted.

## 2024-05-22 NOTE — Unmapped (Signed)
 Pt resting in hospital bed throughout shift. Pt nods and responds to questions appropriately. Pt saturating >95% on 50%, PEEP 8, 35bpm. UOP >34mL/hr thus far. CRRT initiated round 2300. Pt in NSR-ST. Currently attempting to maintain MAPs >65 on levo, vaso, phenyl, and epi. Family called by provider team; currently at bedside. Bed in low, locked position throughout shift. Pt has remained free of injury. Will report to oncoming shift.       Problem: Adult Inpatient Plan of Care  Goal: Plan of Care Review  Outcome: Ongoing - Unchanged     Problem: Adult Inpatient Plan of Care  Goal: Patient-Specific Goal (Individualized)  Outcome: Ongoing - Unchanged     Problem: Adult Inpatient Plan of Care  Goal: Absence of Hospital-Acquired Illness or Injury  Outcome: Ongoing - Unchanged  Intervention: Identify and Manage Fall Risk  Recent Flowsheet Documentation  Taken 05/22/2024 0200 by Raechel Marsa PARAS, RN  Safety Interventions:   bed alarm   commode/urinal/bedpan at bedside   environmental modification   fall reduction program maintained   lighting adjusted for tasks/safety   low bed   nonskid shoes/slippers when out of bed  Taken 05/22/2024 0000 by Willmar Stockinger, Marsa PARAS, RN  Safety Interventions:   bed alarm   commode/urinal/bedpan at bedside   environmental modification   fall reduction program maintained   lighting adjusted for tasks/safety   low bed   nonskid shoes/slippers when out of bed  Taken 05/21/2024 2200 by Miraya Cudney, Marsa PARAS, RN  Safety Interventions:   bed alarm   commode/urinal/bedpan at bedside   environmental modification   fall reduction program maintained   lighting adjusted for tasks/safety   low bed   nonskid shoes/slippers when out of bed  Taken 05/21/2024 2000 by Raechel Marsa PARAS, RN  Safety Interventions:   bed alarm   environmental modification   fall reduction program maintained   lighting adjusted for tasks/safety   low bed   nonskid shoes/slippers when out of bed  Intervention: Prevent Skin Injury  Recent Flowsheet Documentation  Taken 05/22/2024 0200 by Raechel Marsa PARAS, RN  Positioning for Skin: Left  Device Skin Pressure Protection:   absorbent pad utilized/changed   adhesive use limited  Skin Protection: adhesive use limited  Taken 05/22/2024 0000 by Raechel Marsa PARAS, RN  Positioning for Skin: Right  Device Skin Pressure Protection:   absorbent pad utilized/changed   adhesive use limited  Skin Protection: adhesive use limited  Taken 05/21/2024 2200 by Raechel Marsa PARAS, RN  Positioning for Skin: Left  Device Skin Pressure Protection:   absorbent pad utilized/changed   adhesive use limited  Skin Protection: adhesive use limited  Taken 05/21/2024 2000 by Raechel Marsa PARAS, RN  Positioning for Skin: Right  Device Skin Pressure Protection:   absorbent pad utilized/changed   adhesive use limited  Skin Protection: adhesive use limited

## 2024-05-23 ENCOUNTER — Encounter: Admitting: Psychiatry

## 2024-05-23 LAB — HIV ANTIGEN/ANTIBODY COMBO: HIV ANTIGEN/ANTIBODY COMBO: NONREACTIVE

## 2024-05-23 LAB — HEPATITIS C ANTIBODY: HEPATITIS C ANTIBODY: NONREACTIVE

## 2024-05-23 LAB — PATIENT NEEDLESTICK PACKAGE
HCV RNA: NOT DETECTED
HEPATITIS B SURFACE ANTIGEN: NONREACTIVE
HEPATITIS C ANTIBODY: NONREACTIVE
HIV ANTIGEN/ANTIBODY COMBO: NONREACTIVE

## 2024-05-23 LAB — HEPATITIS B SURFACE ANTIBODY
HEPATITIS B SURFACE ANTIBODY QUANT: 18.7 m[IU]/mL — ABNORMAL HIGH (ref <8.00)
HEPATITIS B SURFACE ANTIBODY: REACTIVE — AB

## 2024-05-23 LAB — BODY FLUID, PATH REVIEW

## 2024-05-24 NOTE — Unmapped (Signed)
 Error

## 2024-05-26 NOTE — Unmapped (Signed)
 Contact patient's cousin--Ryan Caudle at [phone].   I was able to reach her, I offered my condolences.

## 2024-05-27 ENCOUNTER — Ambulatory Visit: Admission: RE | Admit: 2024-05-27 | Source: Ambulatory Visit

## 2024-05-31 DEATH — deceased

## 2024-08-16 NOTE — Unmapped (Signed)
 Copied from CRM [id number]. Topic: Access To Clinicians - Req Clinic Call Back  >> Aug 16, 2024 11:23 AM Jon Sharps wrote:  Reason of the Call: discuss orders not signed    Requesting: call back    Supporting details:Lakeisha from [facility] request call back states they need orders from 04/27/2024 to be signed and fax back    Appointment: n/a      [facility] preferred contact: [phone]  Urgent callback turnaround time: within 24 business hours. Programmer, systems Notified)

## 2024-08-17 NOTE — Unmapped (Signed)
 Please ask Kortlyn to send to Legal for further advise

## 2024-08-17 NOTE — Unmapped (Signed)
 Message forwarded to Practice manager [facility], CMA

## 2024-08-18 NOTE — Unmapped (Unsigned)
 Copied from CRM #[id number]. Topic: Access To Clinicians - Req Clinic Call Back  >> Aug 18, 2024  3:05 PM Landry HERO wrote:  Reason of the Call: discuss orders not signed     Requesting: call back     Supporting details:Lakeisha from [facility] request call back states they need orders from 04/27/2024 to be signed and fax back     Appointment: n/a        [facility] well Home [facility] preferred contact: [phone]  Urgent callback turnaround time: within 24 business hours. Programmer, systems Notified)
